# Patient Record
Sex: Male | Born: 1955
Health system: Southern US, Community
[De-identification: ages and names within clinical notes are randomized; demographics above are authoritative.]

## PROBLEM LIST (undated history)

## (undated) DIAGNOSIS — J302 Other seasonal allergic rhinitis: Secondary | ICD-10-CM

## (undated) DIAGNOSIS — I4891 Unspecified atrial fibrillation: Secondary | ICD-10-CM

## (undated) DIAGNOSIS — E785 Hyperlipidemia, unspecified: Secondary | ICD-10-CM

## (undated) DIAGNOSIS — I428 Other cardiomyopathies: Secondary | ICD-10-CM

## (undated) DIAGNOSIS — J45909 Unspecified asthma, uncomplicated: Secondary | ICD-10-CM

## (undated) DIAGNOSIS — I4892 Unspecified atrial flutter: Secondary | ICD-10-CM

## (undated) DIAGNOSIS — I10 Essential (primary) hypertension: Secondary | ICD-10-CM

## (undated) DIAGNOSIS — G4730 Sleep apnea, unspecified: Secondary | ICD-10-CM

## (undated) DIAGNOSIS — K5790 Diverticulosis of intestine, part unspecified, without perforation or abscess without bleeding: Secondary | ICD-10-CM

## (undated) DIAGNOSIS — K635 Polyp of colon: Secondary | ICD-10-CM

## (undated) DIAGNOSIS — I519 Heart disease, unspecified: Secondary | ICD-10-CM

## (undated) DIAGNOSIS — H269 Unspecified cataract: Secondary | ICD-10-CM

## (undated) DIAGNOSIS — I509 Heart failure, unspecified: Secondary | ICD-10-CM

## (undated) DIAGNOSIS — Q249 Congenital malformation of heart, unspecified: Secondary | ICD-10-CM

## (undated) DIAGNOSIS — T7840XA Allergy, unspecified, initial encounter: Secondary | ICD-10-CM

## (undated) DIAGNOSIS — J310 Chronic rhinitis: Secondary | ICD-10-CM

## (undated) DIAGNOSIS — E119 Type 2 diabetes mellitus without complications: Secondary | ICD-10-CM

## (undated) HISTORY — PX: POLYPECTOMY: SHX149

## (undated) HISTORY — DX: Heart failure, unspecified: I50.9

## (undated) HISTORY — DX: Heart disease, unspecified: I51.9

## (undated) HISTORY — DX: Essential (primary) hypertension: I10

## (undated) HISTORY — DX: Hyperlipidemia, unspecified: E78.5

## (undated) HISTORY — DX: Other cardiomyopathies: I42.8

## (undated) HISTORY — DX: Unspecified atrial fibrillation: I48.91

## (undated) HISTORY — DX: Morbid (severe) obesity due to excess calories: E66.01

## (undated) HISTORY — DX: Unspecified asthma, uncomplicated: J45.909

## (undated) HISTORY — DX: Congenital malformation of heart, unspecified: Q24.9

## (undated) HISTORY — DX: Unspecified atrial flutter: I48.92

## (undated) HISTORY — DX: Chronic rhinitis: J31.0

## (undated) HISTORY — DX: Sleep apnea, unspecified: G47.30

## (undated) HISTORY — DX: Polyp of colon: K63.5

## (undated) HISTORY — DX: Diverticulosis of intestine, part unspecified, without perforation or abscess without bleeding: K57.90

## (undated) HISTORY — DX: Unspecified cataract: H26.9

## (undated) HISTORY — PX: COLONOSCOPY: SHX174

## (undated) HISTORY — DX: Allergy, unspecified, initial encounter: T78.40XA

---

## 2000-07-27 ENCOUNTER — Ambulatory Visit (HOSPITAL_BASED_OUTPATIENT_CLINIC_OR_DEPARTMENT_OTHER): Admission: RE | Admit: 2000-07-27 | Discharge: 2000-07-27 | Payer: Self-pay | Admitting: Internal Medicine

## 2000-07-27 ENCOUNTER — Encounter: Payer: Self-pay | Admitting: Pulmonary Disease

## 2000-11-23 ENCOUNTER — Encounter: Payer: Self-pay | Admitting: Pulmonary Disease

## 2000-11-23 ENCOUNTER — Ambulatory Visit (HOSPITAL_BASED_OUTPATIENT_CLINIC_OR_DEPARTMENT_OTHER): Admission: RE | Admit: 2000-11-23 | Discharge: 2000-11-23 | Payer: Self-pay | Admitting: Internal Medicine

## 2004-05-17 ENCOUNTER — Ambulatory Visit: Payer: Self-pay | Admitting: Internal Medicine

## 2004-07-25 ENCOUNTER — Ambulatory Visit: Payer: Self-pay | Admitting: Internal Medicine

## 2004-08-22 ENCOUNTER — Ambulatory Visit: Payer: Self-pay | Admitting: Internal Medicine

## 2004-12-11 ENCOUNTER — Ambulatory Visit: Payer: Self-pay | Admitting: Internal Medicine

## 2005-03-26 ENCOUNTER — Ambulatory Visit: Payer: Self-pay | Admitting: Internal Medicine

## 2005-05-20 ENCOUNTER — Ambulatory Visit: Payer: Self-pay | Admitting: Internal Medicine

## 2005-06-20 ENCOUNTER — Ambulatory Visit: Payer: Self-pay | Admitting: Internal Medicine

## 2005-08-29 ENCOUNTER — Ambulatory Visit: Payer: Self-pay | Admitting: Internal Medicine

## 2005-09-05 ENCOUNTER — Ambulatory Visit: Payer: Self-pay | Admitting: Internal Medicine

## 2005-10-07 ENCOUNTER — Ambulatory Visit: Payer: Self-pay | Admitting: Internal Medicine

## 2006-10-19 ENCOUNTER — Ambulatory Visit: Payer: Self-pay | Admitting: Internal Medicine

## 2006-11-26 ENCOUNTER — Ambulatory Visit: Payer: Self-pay | Admitting: Internal Medicine

## 2006-11-26 LAB — CONVERTED CEMR LAB
Albumin: 4 g/dL (ref 3.5–5.2)
Alkaline Phosphatase: 47 units/L (ref 39–117)
BUN: 19 mg/dL (ref 6–23)
Basophils Absolute: 0.1 10*3/uL (ref 0.0–0.1)
Bilirubin Urine: NEGATIVE
CO2: 30 meq/L (ref 19–32)
CRP, High Sensitivity: 4 (ref 0.00–5.00)
Cholesterol: 222 mg/dL (ref 0–200)
Creatinine, Ser: 1.1 mg/dL (ref 0.4–1.5)
Direct LDL: 127.1 mg/dL
Eosinophils Absolute: 0.1 10*3/uL (ref 0.0–0.6)
GFR calc Af Amer: 91 mL/min
HDL: 67.7 mg/dL (ref >39.0)
Ketones, ur: NEGATIVE mg/dL
Leukocytes, UA: NEGATIVE
Lymphocytes Relative: 26.2 % (ref 12.0–46.0)
MCHC: 35.1 g/dL (ref 30.0–36.0)
MCV: 89.6 fL (ref 78.0–100.0)
Monocytes Absolute: 0.4 10*3/uL (ref 0.2–0.7)
Monocytes Relative: 7.3 % (ref 3.0–11.0)
Neutro Abs: 2.9 10*3/uL (ref 1.4–7.7)
Nitrite: NEGATIVE
Platelets: 203 10*3/uL (ref 150–400)
Potassium: 3.8 meq/L (ref 3.5–5.1)
Sodium: 142 meq/L (ref 135–145)
Specific Gravity, Urine: 1.025 (ref 1.000–1.03)
Total Bilirubin: 1.3 mg/dL — ABNORMAL HIGH (ref 0.3–1.2)
Total Protein: 7.4 g/dL (ref 6.0–8.3)
Urine Glucose: NEGATIVE mg/dL
Urobilinogen, UA: 0.2 (ref 0.0–1.0)
VLDL: 15 mg/dL (ref 0–40)

## 2007-12-20 ENCOUNTER — Telehealth (INDEPENDENT_AMBULATORY_CARE_PROVIDER_SITE_OTHER): Payer: Self-pay | Admitting: *Deleted

## 2007-12-24 ENCOUNTER — Telehealth (INDEPENDENT_AMBULATORY_CARE_PROVIDER_SITE_OTHER): Payer: Self-pay | Admitting: *Deleted

## 2008-01-24 DIAGNOSIS — E785 Hyperlipidemia, unspecified: Secondary | ICD-10-CM

## 2008-01-24 DIAGNOSIS — E1169 Type 2 diabetes mellitus with other specified complication: Secondary | ICD-10-CM | POA: Insufficient documentation

## 2008-01-24 DIAGNOSIS — G4730 Sleep apnea, unspecified: Secondary | ICD-10-CM | POA: Insufficient documentation

## 2008-01-24 DIAGNOSIS — I10 Essential (primary) hypertension: Secondary | ICD-10-CM | POA: Insufficient documentation

## 2008-01-25 ENCOUNTER — Ambulatory Visit: Payer: Self-pay | Admitting: Internal Medicine

## 2008-01-25 DIAGNOSIS — J45909 Unspecified asthma, uncomplicated: Secondary | ICD-10-CM | POA: Insufficient documentation

## 2008-01-28 ENCOUNTER — Telehealth (INDEPENDENT_AMBULATORY_CARE_PROVIDER_SITE_OTHER): Payer: Self-pay | Admitting: *Deleted

## 2008-01-28 ENCOUNTER — Encounter: Payer: Self-pay | Admitting: Internal Medicine

## 2008-03-08 ENCOUNTER — Ambulatory Visit: Payer: Self-pay | Admitting: Internal Medicine

## 2008-03-08 DIAGNOSIS — N400 Enlarged prostate without lower urinary tract symptoms: Secondary | ICD-10-CM | POA: Insufficient documentation

## 2008-03-09 LAB — CONVERTED CEMR LAB
AST: 36 units/L (ref 0–37)
Albumin: 4.2 g/dL (ref 3.5–5.2)
Alkaline Phosphatase: 56 units/L (ref 39–117)
BUN: 16 mg/dL (ref 6–23)
Basophils Relative: 1.1 % (ref 0.0–3.0)
Cholesterol: 176 mg/dL (ref 0–200)
Creatinine, Ser: 1.1 mg/dL (ref 0.4–1.5)
Crystals: NEGATIVE
Eosinophils Absolute: 0.2 10*3/uL (ref 0.0–0.7)
Eosinophils Relative: 3.7 % (ref 0.0–5.0)
GFR calc Af Amer: 90 mL/min
Glucose, Bld: 108 mg/dL — ABNORMAL HIGH (ref 70–99)
HCT: 49.4 % (ref 39.0–52.0)
Hemoglobin: 17.5 g/dL — ABNORMAL HIGH (ref 13.0–17.0)
MCV: 92.4 fL (ref 78.0–100.0)
Monocytes Absolute: 0.5 10*3/uL (ref 0.1–1.0)
Monocytes Relative: 9.4 % (ref 3.0–12.0)
Neutro Abs: 2.8 10*3/uL (ref 1.4–7.7)
Nitrite: NEGATIVE
PSA: 4.06 ng/mL — ABNORMAL HIGH (ref 0.10–4.00)
Platelets: 199 10*3/uL (ref 150–400)
Potassium: 3.9 meq/L (ref 3.5–5.1)
RBC: 5.35 M/uL (ref 4.22–5.81)
Specific Gravity, Urine: 1.02 (ref 1.000–1.03)
TSH: 2.17 microintl units/mL (ref 0.35–5.50)
Total Protein: 7.8 g/dL (ref 6.0–8.3)
Urine Glucose: NEGATIVE mg/dL
VLDL: 11 mg/dL (ref 0–40)
WBC: 4.8 10*3/uL (ref 4.5–10.5)
pH: 6 (ref 5.0–8.0)

## 2008-03-20 ENCOUNTER — Telehealth (INDEPENDENT_AMBULATORY_CARE_PROVIDER_SITE_OTHER): Payer: Self-pay | Admitting: *Deleted

## 2008-03-22 ENCOUNTER — Ambulatory Visit: Payer: Self-pay | Admitting: Gastroenterology

## 2008-04-03 ENCOUNTER — Ambulatory Visit: Payer: Self-pay | Admitting: Gastroenterology

## 2008-04-03 ENCOUNTER — Encounter: Payer: Self-pay | Admitting: Gastroenterology

## 2008-04-04 ENCOUNTER — Encounter: Payer: Self-pay | Admitting: Gastroenterology

## 2009-01-05 ENCOUNTER — Encounter: Payer: Self-pay | Admitting: Internal Medicine

## 2009-01-17 ENCOUNTER — Telehealth: Payer: Self-pay | Admitting: Internal Medicine

## 2009-01-24 ENCOUNTER — Ambulatory Visit: Payer: Self-pay | Admitting: Internal Medicine

## 2009-03-09 ENCOUNTER — Ambulatory Visit: Payer: Self-pay | Admitting: Internal Medicine

## 2009-03-09 DIAGNOSIS — I4891 Unspecified atrial fibrillation: Secondary | ICD-10-CM | POA: Insufficient documentation

## 2009-03-12 ENCOUNTER — Ambulatory Visit: Payer: Self-pay | Admitting: Cardiology

## 2009-03-12 ENCOUNTER — Ambulatory Visit: Payer: Self-pay | Admitting: Internal Medicine

## 2009-03-12 ENCOUNTER — Inpatient Hospital Stay (HOSPITAL_COMMUNITY): Admission: EM | Admit: 2009-03-12 | Discharge: 2009-03-16 | Payer: Self-pay | Admitting: Internal Medicine

## 2009-03-13 ENCOUNTER — Encounter: Payer: Self-pay | Admitting: Cardiology

## 2009-03-13 ENCOUNTER — Ambulatory Visit: Payer: Self-pay | Admitting: Internal Medicine

## 2009-03-13 ENCOUNTER — Telehealth (INDEPENDENT_AMBULATORY_CARE_PROVIDER_SITE_OTHER): Payer: Self-pay | Admitting: *Deleted

## 2009-03-19 ENCOUNTER — Ambulatory Visit: Payer: Self-pay | Admitting: Internal Medicine

## 2009-03-19 LAB — CONVERTED CEMR LAB: POC INR: 1.7

## 2009-03-22 ENCOUNTER — Ambulatory Visit: Payer: Self-pay | Admitting: Internal Medicine

## 2009-03-23 ENCOUNTER — Ambulatory Visit: Payer: Self-pay | Admitting: Internal Medicine

## 2009-03-23 LAB — CONVERTED CEMR LAB: POC INR: 1.8

## 2009-03-26 ENCOUNTER — Telehealth: Payer: Self-pay | Admitting: Internal Medicine

## 2009-03-26 LAB — CONVERTED CEMR LAB
Basophils Relative: 0.9 % (ref 0.0–3.0)
Chloride: 110 meq/L (ref 96–112)
Creatinine, Ser: 1.4 mg/dL (ref 0.4–1.5)
Eosinophils Relative: 2.3 % (ref 0.0–5.0)
GFR calc non Af Amer: 56.08 mL/min (ref >60)
HCT: 47.6 % (ref 39.0–52.0)
MCV: 92 fL (ref 78.0–100.0)
Monocytes Absolute: 0.5 10*3/uL (ref 0.1–1.0)
Monocytes Relative: 8.9 % (ref 3.0–12.0)
Neutrophils Relative %: 68.4 % (ref 43.0–77.0)
Potassium: 4.2 meq/L (ref 3.5–5.1)
RBC: 5.17 M/uL (ref 4.22–5.81)
WBC: 6.1 10*3/uL (ref 4.5–10.5)

## 2009-03-28 ENCOUNTER — Ambulatory Visit: Payer: Self-pay | Admitting: Internal Medicine

## 2009-03-28 DIAGNOSIS — I5022 Chronic systolic (congestive) heart failure: Secondary | ICD-10-CM | POA: Insufficient documentation

## 2009-04-09 ENCOUNTER — Telehealth: Payer: Self-pay | Admitting: Internal Medicine

## 2009-04-10 ENCOUNTER — Encounter: Payer: Self-pay | Admitting: Internal Medicine

## 2009-04-10 ENCOUNTER — Ambulatory Visit: Payer: Self-pay | Admitting: Cardiovascular Disease

## 2009-04-10 ENCOUNTER — Ambulatory Visit: Payer: Self-pay | Admitting: Internal Medicine

## 2009-04-10 LAB — CONVERTED CEMR LAB
Calcium: 8.8 mg/dL (ref 8.4–10.5)
GFR calc non Af Amer: 61.08 mL/min (ref >60)
Glucose, Bld: 108 mg/dL — ABNORMAL HIGH (ref 70–99)
POC INR: 2
Potassium: 3.6 meq/L (ref 3.5–5.1)
Pro B Natriuretic peptide (BNP): 1300 pg/mL — ABNORMAL HIGH (ref 0.0–100.0)
Sodium: 141 meq/L (ref 135–145)

## 2009-04-12 ENCOUNTER — Ambulatory Visit: Payer: Self-pay | Admitting: Internal Medicine

## 2009-04-17 ENCOUNTER — Encounter: Payer: Self-pay | Admitting: Internal Medicine

## 2009-04-18 ENCOUNTER — Telehealth: Payer: Self-pay | Admitting: Internal Medicine

## 2009-04-20 ENCOUNTER — Ambulatory Visit: Payer: Self-pay | Admitting: Cardiovascular Disease

## 2009-04-20 LAB — CONVERTED CEMR LAB: POC INR: 3.9

## 2009-04-23 ENCOUNTER — Telehealth: Payer: Self-pay | Admitting: Internal Medicine

## 2009-04-30 ENCOUNTER — Ambulatory Visit: Payer: Self-pay | Admitting: Cardiology

## 2009-04-30 ENCOUNTER — Ambulatory Visit: Payer: Self-pay | Admitting: Internal Medicine

## 2009-04-30 LAB — CONVERTED CEMR LAB: POC INR: 3.9

## 2009-05-01 ENCOUNTER — Encounter: Payer: Self-pay | Admitting: Internal Medicine

## 2009-05-04 ENCOUNTER — Ambulatory Visit (HOSPITAL_COMMUNITY): Admission: RE | Admit: 2009-05-04 | Discharge: 2009-05-04 | Payer: Self-pay | Admitting: Internal Medicine

## 2009-05-04 ENCOUNTER — Ambulatory Visit: Payer: Self-pay | Admitting: Cardiology

## 2009-05-07 ENCOUNTER — Telehealth: Payer: Self-pay | Admitting: Internal Medicine

## 2009-05-11 ENCOUNTER — Ambulatory Visit: Payer: Self-pay | Admitting: Internal Medicine

## 2009-06-01 ENCOUNTER — Ambulatory Visit: Payer: Self-pay | Admitting: Cardiovascular Disease

## 2009-06-01 ENCOUNTER — Encounter (INDEPENDENT_AMBULATORY_CARE_PROVIDER_SITE_OTHER): Payer: Self-pay | Admitting: Cardiology

## 2009-06-01 LAB — CONVERTED CEMR LAB: POC INR: 3.3

## 2009-06-04 ENCOUNTER — Telehealth: Payer: Self-pay | Admitting: Internal Medicine

## 2009-06-12 ENCOUNTER — Ambulatory Visit: Payer: Self-pay | Admitting: Cardiology

## 2009-06-12 ENCOUNTER — Ambulatory Visit: Payer: Self-pay | Admitting: Internal Medicine

## 2009-06-12 LAB — CONVERTED CEMR LAB: POC INR: 4.5

## 2009-06-21 ENCOUNTER — Encounter: Payer: Self-pay | Admitting: Internal Medicine

## 2009-06-22 ENCOUNTER — Inpatient Hospital Stay (HOSPITAL_COMMUNITY): Admission: AD | Admit: 2009-06-22 | Discharge: 2009-06-24 | Payer: Self-pay | Admitting: Internal Medicine

## 2009-06-22 ENCOUNTER — Ambulatory Visit: Payer: Self-pay | Admitting: Internal Medicine

## 2009-06-29 ENCOUNTER — Telehealth: Payer: Self-pay | Admitting: Internal Medicine

## 2009-07-04 ENCOUNTER — Telehealth (INDEPENDENT_AMBULATORY_CARE_PROVIDER_SITE_OTHER): Payer: Self-pay | Admitting: Cardiology

## 2009-07-06 ENCOUNTER — Ambulatory Visit: Payer: Self-pay | Admitting: Cardiology

## 2009-07-06 LAB — CONVERTED CEMR LAB: POC INR: 3.1

## 2009-07-13 ENCOUNTER — Encounter (INDEPENDENT_AMBULATORY_CARE_PROVIDER_SITE_OTHER): Payer: Self-pay | Admitting: Cardiology

## 2009-07-13 ENCOUNTER — Ambulatory Visit: Payer: Self-pay | Admitting: Internal Medicine

## 2009-07-20 ENCOUNTER — Ambulatory Visit: Payer: Self-pay | Admitting: Internal Medicine

## 2009-07-20 LAB — CONVERTED CEMR LAB: POC INR: 2.5

## 2009-07-27 ENCOUNTER — Ambulatory Visit: Payer: Self-pay | Admitting: Internal Medicine

## 2009-08-02 ENCOUNTER — Ambulatory Visit: Payer: Self-pay | Admitting: Cardiology

## 2009-08-03 ENCOUNTER — Ambulatory Visit (HOSPITAL_COMMUNITY): Admission: RE | Admit: 2009-08-03 | Discharge: 2009-08-03 | Payer: Self-pay | Admitting: Radiology

## 2009-08-03 ENCOUNTER — Ambulatory Visit: Payer: Self-pay | Admitting: Cardiology

## 2009-08-15 ENCOUNTER — Encounter: Payer: Self-pay | Admitting: Internal Medicine

## 2009-09-05 ENCOUNTER — Ambulatory Visit: Payer: Self-pay | Admitting: Internal Medicine

## 2009-09-05 DIAGNOSIS — I5043 Acute on chronic combined systolic (congestive) and diastolic (congestive) heart failure: Secondary | ICD-10-CM | POA: Insufficient documentation

## 2009-09-20 ENCOUNTER — Ambulatory Visit (HOSPITAL_COMMUNITY)
Admission: RE | Admit: 2009-09-20 | Discharge: 2009-09-20 | Payer: Self-pay | Source: Home / Self Care | Admitting: Internal Medicine

## 2009-09-20 ENCOUNTER — Encounter: Payer: Self-pay | Admitting: Internal Medicine

## 2009-09-20 ENCOUNTER — Ambulatory Visit: Payer: Self-pay | Admitting: Cardiology

## 2009-09-20 ENCOUNTER — Ambulatory Visit: Payer: Self-pay | Admitting: Internal Medicine

## 2009-09-20 ENCOUNTER — Ambulatory Visit: Payer: Self-pay

## 2009-09-20 ENCOUNTER — Encounter (INDEPENDENT_AMBULATORY_CARE_PROVIDER_SITE_OTHER): Payer: Self-pay

## 2009-09-20 LAB — CONVERTED CEMR LAB
CO2: 32 meq/L (ref 19–32)
Calcium: 9 mg/dL (ref 8.4–10.5)
Creatinine, Ser: 1.3 mg/dL (ref 0.4–1.5)
Glucose, Bld: 111 mg/dL — ABNORMAL HIGH (ref 70–99)

## 2009-09-27 ENCOUNTER — Ambulatory Visit: Payer: Self-pay | Admitting: Internal Medicine

## 2009-10-26 ENCOUNTER — Ambulatory Visit: Payer: Self-pay | Admitting: Cardiology

## 2009-11-16 ENCOUNTER — Ambulatory Visit: Payer: Self-pay | Admitting: Cardiovascular Disease

## 2009-11-16 LAB — CONVERTED CEMR LAB: POC INR: 2.6

## 2009-12-07 ENCOUNTER — Ambulatory Visit: Payer: Self-pay | Admitting: Cardiology

## 2010-01-04 ENCOUNTER — Ambulatory Visit: Payer: Self-pay | Admitting: Cardiology

## 2010-02-06 ENCOUNTER — Ambulatory Visit: Payer: Self-pay | Admitting: Internal Medicine

## 2010-02-06 ENCOUNTER — Encounter: Payer: Self-pay | Admitting: Internal Medicine

## 2010-02-06 LAB — CONVERTED CEMR LAB: POC INR: 1.4

## 2010-02-15 ENCOUNTER — Ambulatory Visit: Payer: Self-pay | Admitting: Internal Medicine

## 2010-03-22 ENCOUNTER — Ambulatory Visit: Payer: Self-pay | Admitting: Internal Medicine

## 2010-03-22 DIAGNOSIS — E039 Hypothyroidism, unspecified: Secondary | ICD-10-CM | POA: Insufficient documentation

## 2010-04-08 ENCOUNTER — Telehealth (INDEPENDENT_AMBULATORY_CARE_PROVIDER_SITE_OTHER): Payer: Self-pay | Admitting: *Deleted

## 2010-04-26 ENCOUNTER — Ambulatory Visit: Payer: Self-pay | Admitting: Internal Medicine

## 2010-04-26 DIAGNOSIS — R319 Hematuria, unspecified: Secondary | ICD-10-CM | POA: Insufficient documentation

## 2010-04-29 LAB — CONVERTED CEMR LAB
ALT: 26 units/L (ref 0–53)
AST: 26 units/L (ref 0–37)
Albumin: 4.2 g/dL (ref 3.5–5.2)
Alkaline Phosphatase: 57 units/L (ref 39–117)
Eosinophils Relative: 3.4 % (ref 0.0–5.0)
GFR calc non Af Amer: 55.4 mL/min (ref >60)
HCT: 45.9 % (ref 39.0–52.0)
Hemoglobin: 16.2 g/dL (ref 13.0–17.0)
LDL Cholesterol: 80 mg/dL (ref 0–99)
Lymphocytes Relative: 20.6 % (ref 12.0–46.0)
Lymphs Abs: 1.3 10*3/uL (ref 0.7–4.0)
Monocytes Relative: 7.8 % (ref 3.0–12.0)
Neutro Abs: 4.2 10*3/uL (ref 1.4–7.7)
Nitrite: NEGATIVE
Potassium: 4.2 meq/L (ref 3.5–5.1)
Sodium: 144 meq/L (ref 135–145)
TSH: 8.38 microintl units/mL — ABNORMAL HIGH (ref 0.35–5.50)
Total Bilirubin: 0.9 mg/dL (ref 0.3–1.2)
Total CHOL/HDL Ratio: 3
Total Protein, Urine: NEGATIVE mg/dL
Urine Glucose: NEGATIVE mg/dL
VLDL: 19.2 mg/dL (ref 0.0–40.0)
WBC: 6.2 10*3/uL (ref 4.5–10.5)
pH: 6.5 (ref 5.0–8.0)

## 2010-05-15 ENCOUNTER — Telehealth: Payer: Self-pay | Admitting: Internal Medicine

## 2010-05-16 ENCOUNTER — Telehealth: Payer: Self-pay | Admitting: Internal Medicine

## 2010-05-17 ENCOUNTER — Ambulatory Visit: Payer: Self-pay | Admitting: Internal Medicine

## 2010-05-18 ENCOUNTER — Encounter: Payer: Self-pay | Admitting: Adult Health

## 2010-05-20 ENCOUNTER — Telehealth (INDEPENDENT_AMBULATORY_CARE_PROVIDER_SITE_OTHER): Payer: Self-pay | Admitting: *Deleted

## 2010-05-20 LAB — CONVERTED CEMR LAB
Casts: NONE SEEN /lpf
Crystals: NONE SEEN
Leukocytes, UA: NEGATIVE
Specific Gravity, Urine: 1.015 (ref 1.005–1.030)
Squamous Epithelial / LPF: NONE SEEN /lpf
Urine Glucose: NEGATIVE mg/dL
pH: 6 (ref 5.0–8.0)

## 2010-05-22 DIAGNOSIS — E291 Testicular hypofunction: Secondary | ICD-10-CM | POA: Insufficient documentation

## 2010-05-23 ENCOUNTER — Telehealth: Payer: Self-pay | Admitting: Internal Medicine

## 2010-05-29 ENCOUNTER — Telehealth (INDEPENDENT_AMBULATORY_CARE_PROVIDER_SITE_OTHER): Payer: Self-pay | Admitting: *Deleted

## 2010-05-30 ENCOUNTER — Encounter: Payer: Self-pay | Admitting: Adult Health

## 2010-06-04 ENCOUNTER — Telehealth (INDEPENDENT_AMBULATORY_CARE_PROVIDER_SITE_OTHER): Payer: Self-pay | Admitting: *Deleted

## 2010-06-04 ENCOUNTER — Telehealth: Payer: Self-pay | Admitting: Internal Medicine

## 2010-06-04 ENCOUNTER — Encounter: Payer: Self-pay | Admitting: Adult Health

## 2010-06-19 ENCOUNTER — Telehealth: Payer: Self-pay | Admitting: Internal Medicine

## 2010-06-28 ENCOUNTER — Ambulatory Visit
Admission: RE | Admit: 2010-06-28 | Discharge: 2010-06-28 | Payer: Self-pay | Source: Home / Self Care | Attending: Internal Medicine | Admitting: Internal Medicine

## 2010-07-12 ENCOUNTER — Other Ambulatory Visit: Payer: Self-pay | Admitting: Internal Medicine

## 2010-07-12 ENCOUNTER — Encounter: Payer: Self-pay | Admitting: Internal Medicine

## 2010-07-12 ENCOUNTER — Ambulatory Visit
Admission: RE | Admit: 2010-07-12 | Discharge: 2010-07-12 | Payer: Self-pay | Source: Home / Self Care | Attending: Internal Medicine | Admitting: Internal Medicine

## 2010-07-12 DIAGNOSIS — I4892 Unspecified atrial flutter: Secondary | ICD-10-CM | POA: Insufficient documentation

## 2010-07-12 LAB — CBC WITH DIFFERENTIAL/PLATELET
Basophils Absolute: 0 10*3/uL (ref 0.0–0.1)
Eosinophils Absolute: 0.2 10*3/uL (ref 0.0–0.7)
Eosinophils Relative: 2.8 % (ref 0.0–5.0)
MCV: 90.2 fl (ref 78.0–100.0)
Monocytes Absolute: 0.5 10*3/uL (ref 0.1–1.0)
Neutrophils Relative %: 64.4 % (ref 43.0–77.0)
Platelets: 187 10*3/uL (ref 150.0–400.0)
RDW: 13.8 % (ref 11.5–14.6)
WBC: 6.9 10*3/uL (ref 4.5–10.5)

## 2010-07-12 LAB — BASIC METABOLIC PANEL
BUN: 29 mg/dL — ABNORMAL HIGH (ref 6–23)
Chloride: 109 mEq/L (ref 96–112)
Creatinine, Ser: 1.4 mg/dL (ref 0.4–1.5)

## 2010-07-14 LAB — CONVERTED CEMR LAB
ALT: 23 U/L
AST: 28 U/L
Albumin: 4.5 g/dL
Alkaline Phosphatase: 52 U/L
BUN: 18 mg/dL
BUN: 22 mg/dL
BUN: 26 mg/dL — ABNORMAL HIGH (ref 6–23)
BUN: 49 mg/dL — ABNORMAL HIGH (ref 6–23)
Basophils Absolute: 0 K/uL
Basophils Absolute: 0.1 K/uL
Basophils Relative: 0 % (ref 0.0–3.0)
Basophils Relative: 0.1 %
Basophils Relative: 1.1 %
Bilirubin, Direct: 0.2 mg/dL
CO2: 28 meq/L
CO2: 33 meq/L — ABNORMAL HIGH
CO2: 34 meq/L — ABNORMAL HIGH (ref 19–32)
CO2: 36 meq/L — ABNORMAL HIGH (ref 19–32)
Calcium: 9.1 mg/dL (ref 8.4–10.5)
Calcium: 9.2 mg/dL
Calcium: 9.2 mg/dL (ref 8.4–10.5)
Calcium: 9.3 mg/dL
Calcium: 9.6 mg/dL (ref 8.4–10.5)
Chloride: 102 meq/L (ref 96–112)
Chloride: 103 meq/L
Chloride: 105 meq/L
Cholesterol: 145 mg/dL
Creatinine, Ser: 1.2 mg/dL (ref 0.4–1.5)
Creatinine, Ser: 1.3 mg/dL
Creatinine, Ser: 1.4 mg/dL
Creatinine, Ser: 1.4 mg/dL (ref 0.4–1.5)
Creatinine, Ser: 1.9 mg/dL — ABNORMAL HIGH (ref 0.4–1.5)
Eosinophils Absolute: 0.1 K/uL
Eosinophils Absolute: 0.2 10*3/uL (ref 0.0–0.7)
Eosinophils Absolute: 0.2 10*3/uL (ref 0.0–0.7)
Eosinophils Absolute: 0.2 K/uL
Eosinophils Relative: 1.3 %
Eosinophils Relative: 3 %
GFR calc non Af Amer: 56.09 mL/min
GFR calc non Af Amer: 60.36 mL/min
GFR calc non Af Amer: 66.98 mL/min (ref >60)
Glucose, Bld: 119 mg/dL — ABNORMAL HIGH
Glucose, Bld: 130 mg/dL — ABNORMAL HIGH (ref 70–99)
Glucose, Bld: 92 mg/dL
HCT: 40.7 %
HCT: 48.6 %
HDL: 54.4 mg/dL
Hemoglobin: 14.4 g/dL
Hemoglobin: 16.4 g/dL
Hemoglobin: 17.5 g/dL — ABNORMAL HIGH (ref 13.0–17.0)
INR: 2.1 — ABNORMAL HIGH (ref 0.8–1.0)
INR: 4.1 — ABNORMAL HIGH (ref 0.8–1.0)
LDL Cholesterol: 82 mg/dL
Leukocytes, UA: NEGATIVE
Lymphocytes Relative: 15.2 %
Lymphocytes Relative: 19.8 % (ref 12.0–46.0)
Lymphocytes Relative: 23.5 %
Lymphs Abs: 1.1 K/uL
Lymphs Abs: 1.3 K/uL
MCHC: 33.7 g/dL
MCHC: 33.7 g/dL (ref 30.0–36.0)
MCHC: 35.5 g/dL
MCV: 90.4 fL (ref 78.0–100.0)
MCV: 92.1 fL
MCV: 94.1 fL
Magnesium: 1.8 mg/dL (ref 1.5–2.5)
Monocytes Absolute: 0.3 K/uL
Monocytes Absolute: 0.5 10*3/uL (ref 0.1–1.0)
Monocytes Absolute: 0.5 K/uL
Monocytes Relative: 4.1 %
Monocytes Relative: 6.8 % (ref 3.0–12.0)
Monocytes Relative: 9.2 %
Neutro Abs: 3.4 K/uL
Neutro Abs: 5.7 10*3/uL (ref 1.4–7.7)
Neutro Abs: 5.9 K/uL
Neutrophils Relative %: 58.3 % (ref 43.0–77.0)
Neutrophils Relative %: 63.2 %
Neutrophils Relative %: 71 % (ref 43.0–77.0)
Neutrophils Relative %: 79.3 % — ABNORMAL HIGH
Nitrite: NEGATIVE
Platelets: 153 K/uL
Platelets: 157 K/uL
Platelets: 180 10*3/uL (ref 150.0–400.0)
Potassium: 3.4 meq/L — ABNORMAL LOW
Potassium: 3.8 meq/L
Pro B Natriuretic peptide (BNP): 579 pg/mL — ABNORMAL HIGH
Prothrombin Time: 21.6 s — ABNORMAL HIGH (ref 9.1–11.7)
RBC: 4.33 M/uL
RBC: 5.27 M/uL
RBC: 5.89 M/uL — ABNORMAL HIGH (ref 4.22–5.81)
RDW: 13.2 %
RDW: 13.6 %
Sodium: 141 meq/L
Sodium: 143 meq/L (ref 135–145)
Sodium: 144 meq/L
Specific Gravity, Urine: 1.025
TSH: 2.61 u[IU]/mL
TSH: 55.23 u[IU]/mL — ABNORMAL HIGH
Total Bilirubin: 1.2 mg/dL
Total CHOL/HDL Ratio: 3
Total Protein, Urine: 100 mg/dL
Total Protein: 7.2 g/dL
Triglycerides: 42 mg/dL
Urine Glucose: NEGATIVE mg/dL
Urobilinogen, UA: 1
VLDL: 8.4 mg/dL
WBC: 5 10*3/uL (ref 4.5–10.5)
WBC: 5.4 10*3/microliter
WBC: 7.4 10*3/microliter
WBC: 8.1 10*3/uL (ref 4.5–10.5)
aPTT: 44 s — ABNORMAL HIGH (ref 21.7–28.8)
pH: 6

## 2010-07-16 NOTE — Medication Information (Signed)
Summary: rov/sp  Anticoagulant Therapy  Managed by: Weston Brass, PharmD Referring MD: Dr. Johney Frame PCP: Dr Nolon Lennert MD: Excell Seltzer MD, Casimiro Needle Indication 1: Atrial Fibrillation Indication 2: - Lab Used: [facility] [place] Site: [address] INR POC 2.6 INR RANGE 2-3  Dietary changes: no    Health status changes: no    Bleeding/hemorrhagic complications: no    Recent/future hospitalizations: no    Any changes in medication regimen? no    Recent/future dental: no  Any missed doses?: yes     Details: may have missed two doses  Is patient compliant with meds? yes       Allergies: No Known Drug Allergies  Anticoagulation Management History:      The patient is taking warfarin and comes in today for a routine follow up visit.  Negative risk factors for bleeding include an age less than 84 years old.  The bleeding index is 'low risk'.  Positive CHADS2 values include History of CHF and History of HTN.  Negative CHADS2 values include Age > 69 years old.  His last INR was 2.1 ratio.  Anticoagulation responsible provider: Excell Seltzer MD, Casimiro Needle.  INR POC: 2.6.  Cuvette Lot#: [id number].  Exp: 01/2011.    Anticoagulation Management Assessment/Plan:      The patient's current anticoagulation dose is Coumadin 5 mg tabs: Take as directed by coumadin clinic..  The target INR is 2.0-3.0.  The next INR is due 12/07/2009.  Anticoagulation instructions were given to patient.  Results were reviewed/authorized by Weston Brass, PharmD.  He was notified by [facility].         Prior Anticoagulation Instructions: INR 1.7  Take 1 1/2 tablets today then resume same dose of 1 tablet every day except 1 1/2 tablets on Wednesday   Current Anticoagulation Instructions: INR-2.6 Resume normal dosing schedule. Take 1.5 tablets on Wednesday and take 1 tablet on all other days. Return in 3 weeks

## 2010-07-16 NOTE — Medication Information (Signed)
Summary: rov/tm  Anticoagulant Therapy  Managed by: Bethena Midget, RN, BSN Referring MD: Dr. Johney Frame PCP: Dr [name] Supervising MD: Johney Frame MD, Fayrene Fearing Indication 1: Atrial Fibrillation Indication 2: - Lab Used: [facility] [place] Site: [address] INR POC 1.4 INR RANGE 2-3  Dietary changes: no    Health status changes: no    Bleeding/hemorrhagic complications: no    Recent/future hospitalizations: no    Any changes in medication regimen? yes       Details: Starting MVI and fish oil  Recent/future dental: no  Any missed doses?: no       Is patient compliant with meds? yes      Comments: Seeing Dr Johney Frame today  Allergies: No Known Drug Allergies  Anticoagulation Management History:      The patient is taking warfarin and comes in today for a routine follow up visit.  Negative risk factors for bleeding include an age less than 39 years old.  The bleeding index is 'low risk'.  Positive CHADS2 values include History of CHF and History of HTN.  Negative CHADS2 values include Age > 45 years old.  His last INR was 2.1 ratio.  Anticoagulation responsible provider: Allred MD, Fayrene Fearing.  INR POC: 1.4.  Cuvette Lot#: [id number].  Exp: 03/2011.    Anticoagulation Management Assessment/Plan:      The patient's current anticoagulation dose is Coumadin 5 mg tabs: Take as directed by coumadin clinic..  The target INR is 2.0-3.0.  The next INR is due 02/15/2010.  Anticoagulation instructions were given to patient.  Results were reviewed/authorized by Bethena Midget, RN, BSN.  He was notified by Bethena Midget, RN, BSN.         Prior Anticoagulation Instructions: INR 2.4 Continue 5mg s everyday except 7.5mg s on Wednesdays. Recheck in 4 weeks.   Current Anticoagulation Instructions: INR 1.4 Today take 10mg s, Thursday take 7.5mg s then resume 5mg s everyday except 7.5mg s on Wednesdays. Recheck in 10 days.

## 2010-07-16 NOTE — Medication Information (Signed)
Summary: rov/eh  Anticoagulant Therapy  Managed by: Shelby Dubin, PharmD, BCPS, CPP Referring MD: Dr. Johney Frame PCP: Dr Nolon Lennert MD: Gala Romney MD, Reuel Boom Indication 1: Atrial Fibrillation Indication 2: - Lab Used: [facility] [place] Site: [address] INR POC 2.3 INR RANGE 2-3  Dietary changes: no    Health status changes: no    Bleeding/hemorrhagic complications: no    Recent/future hospitalizations: no    Any changes in medication regimen? no    Recent/future dental: no  Any missed doses?: yes     Details: missed Wednesday's dose..    Current Medications (verified): 1)  Bayer Aspirin Ec Low Dose 81 Mg  Tbec (Aspirin) .[name].. 1 By Mouth Once Daily 2)  Crestor 10 Mg  Tabs (Rosuvastatin Calcium) .... Take 1 Tab By Mouth At Bedtime 3)  Nasonex 50 Mcg/act  Susp (Mometasone Furoate) .... 2 Sprays Each Nostril Once Daily 4)  Symbicort 80-4.5 Mcg/act  Aero (Budesonide-Formoterol Fumarate) .... 2 Puffs First Thing  in Am and 2 Puffs Again in Pm About 12 Hours Later 5)  Prilosec Otc 20 Mg Tbec (Omeprazole Magnesium) .... Take  One 30-60 Min Before First Meal of The Day 6)  Carvedilol 12.5 Mg Tabs (Carvedilol) .... 2 Tabs By Mouth  Every Morning and 2every Evening 7)  Diovan 320 Mg Tabs (Valsartan) .[name].. 1 Once Daily 8)  Furosemide 80 Mg Tabs (Furosemide) .... Take One Tablet By Mouth Twice  A Day 9)  Klor-Con M20 20 Meq Cr-Tabs (Potassium Chloride Crys Cr) .[name].. 1 Tab Two Times A Day 10)  Coumadin 5 Mg Tabs (Warfarin Sodium) .... Take As Directed By Coumadin Clinic. 11)  Dilt-Cd 180 Mg Xr24h-Cap (Diltiazem Hcl Coated Beads) .... One Tablet Bid 12)  Vitamin B-12 500 Mcg  Tabs (Cyanocobalamin) .... Take 1 Tablet By Mouth Once A Day 13)  Muscadine .... Once Daily 14)  Cpap 14cm .... At Bedtime 15)  Xopenex Hfa 45 Mcg/act Aero (Levalbuterol Tartrate) .... 2 Every 4 Hours As Needed 16)  Afrin Nasal Spray 0.05 %  Soln (Oxymetazoline Hcl) .... 2 Puffs Two Times A Day  For 5 Days Prn 17)  Tylenol 325 Mg Tabs (Acetaminophen) .... Per Bottle/prn 18)  Oxycodone-Acetaminophen 5-325 Mg Tabs (Oxycodone-Acetaminophen) .[name].. 1-2 Tablets Every 4-6 Hours As Needed 19)  Viagra 100 Mg  Tabs (Sildenafil Citrate) .... Take As Directed/prn 20)  Amiodarone Hcl 200 Mg Tabs (Amiodarone Hcl) .... Take Two Tablets By Mouth Twice A Day  Allergies (verified): No Known Drug Allergies  Anticoagulation Management History:      The patient is taking warfarin and comes in today for a routine follow up visit.  Negative risk factors for bleeding include an age less than 74 years old.  The bleeding index is 'low risk'.  Positive CHADS2 values include History of CHF and History of HTN.  Negative CHADS2 values include Age > 60 years old.  His last INR was 4.1 ratio.  Anticoagulation responsible provider: Arlie Riker MD, Reuel Boom.  INR POC: 2.3.  Cuvette Lot#: [id number].  Exp: 09/2010.    Anticoagulation Management Assessment/Plan:      The patient's current anticoagulation dose is Coumadin 5 mg tabs: Take as directed by coumadin clinic..  The target INR is 2.0-3.0.  The next INR is due 07/20/2009.  Anticoagulation instructions were given to patient.  Results were reviewed/authorized by Shelby Dubin, PharmD, BCPS, CPP.  He was notified by Shelby Dubin PharmD, BCPS, CPP.         Prior Anticoagulation  Instructions: INR 3.1  Take 0.5 tablet today then change dose to 1 tablet daily except 1.5 tablets on Mondays, Wednesdays, and Fridays. Recheck in 1 week.  Current Anticoagulation Instructions: INR 2.3  Continue 1 tab daily except 1.5 tabs Mondays, Wednesdays, Fridays.  OK to have teeth cleaned with INR less than 3.0.  Call clinic with questions [phone],  Recheck in

## 2010-07-16 NOTE — Medication Information (Signed)
Summary: rov/kb  Anticoagulant Therapy  Managed by: Cloyde Reams, RN, BSN Referring MD: Dr. Johney Frame PCP: Dr Nolon Lennert MD: Riley Kill MD, Maisie Fus Indication 1: Atrial Fibrillation Indication 2: - Lab Used: [facility] [place] Site: [address] INR POC 1.9 INR RANGE 2-3  Dietary changes: no    Health status changes: no    Bleeding/hemorrhagic complications: no    Recent/future hospitalizations: no    Any changes in medication regimen? no    Recent/future dental: no  Any missed doses?: no       Is patient compliant with meds? yes       Allergies: No Known Drug Allergies  Anticoagulation Management History:      The patient is taking warfarin and comes in today for a routine follow up visit.  Negative risk factors for bleeding include an age less than 58 years old.  The bleeding index is 'low risk'.  Positive CHADS2 values include History of CHF and History of HTN.  Negative CHADS2 values include Age > 48 years old.  His last INR was 2.1 ratio.  Anticoagulation responsible provider: Riley Kill MD, Maisie Fus.  INR POC: 1.9.  Cuvette Lot#: [id number].  Exp: 02/2011.    Anticoagulation Management Assessment/Plan:      The patient's current anticoagulation dose is Coumadin 5 mg tabs: Take as directed by coumadin clinic..  The target INR is 2.0-3.0.  The next INR is due 01/04/2010.  Anticoagulation instructions were given to patient.  Results were reviewed/authorized by Cloyde Reams, RN, BSN.  He was notified by Cloyde Reams RN.         Prior Anticoagulation Instructions: INR-2.6 Resume normal dosing schedule. Take 1.5 tablets on Wednesday and take 1 tablet on all other days. Return in 3 weeks  Current Anticoagulation Instructions: INR 1.9  Take 1.5 tablets today, then resume same dosage 1 tablet daily except 1.5 tablets on Wednesdays.  Recheck in 4 weeks.

## 2010-07-16 NOTE — Medication Information (Signed)
Summary: ccr/jss  Anticoagulant Therapy  Managed by: Cloyde Reams, RN, BSN Referring MD: Dr. Johney Frame PCP: Dr [name] Supervising MD: Johney Frame MD, Fayrene Fearing Indication 1: Atrial Fibrillation Indication 2: - Lab Used: [facility] [place] Site: [address] INR POC 1.9 INR RANGE 2-3  Dietary changes: no    Health status changes: no    Bleeding/hemorrhagic complications: no    Recent/future hospitalizations: no    Any changes in medication regimen? no    Recent/future dental: no  Any missed doses?: yes     Details: May have missed 1 or 2 doses since last visit.  Pt has been taking 1 tablet daily.    Is patient compliant with meds? yes       Allergies (verified): No Known Drug Allergies  Anticoagulation Management History:      The patient is taking warfarin and comes in today for a routine follow up visit.  Negative risk factors for bleeding include an age less than 37 years old.  The bleeding index is 'low risk'.  Positive CHADS2 values include History of CHF and History of HTN.  Negative CHADS2 values include Age > 57 years old.  His last INR was 2.1 ratio.  Anticoagulation responsible provider: Tresea Heine MD, Fayrene Fearing.  INR POC: 1.9.  Cuvette Lot#: [id number].  Exp: 10/2010.    Anticoagulation Management Assessment/Plan:      The patient's current anticoagulation dose is Coumadin 5 mg tabs: Take as directed by coumadin clinic..  The target INR is 2.0-3.0.  The next INR is due 10/03/2009.  Anticoagulation instructions were given to patient.  Results were reviewed/authorized by Cloyde Reams, RN, BSN.  He was notified by Cloyde Reams RN.         Prior Anticoagulation Instructions: INR 3.6 Continue 5mg s daily except 7.5mg s on Mondays, Wednesdays and Fridays. Recheck in one week.   Current Anticoagulation Instructions: INR 1.9  Take 1 tablet daily except 1.5 tablets on Wednesdays.  Recheck in 4 weeks.

## 2010-07-16 NOTE — Progress Notes (Signed)
Summary: amiodarone and warfarin interaction--attempt to see patient  Phone Note Outgoing Call   Call placed by: Bethena Midget, RN Call placed to: Patient Summary of Call: Note received from [facility] regarding amiodarone and warfarin interaction.  Patient d/cd from [facility] on 1/9 for follow-up asap in [facility].  Patient had scheduled next follow-up for 1/21.  Hadley Pen, RN called patient to educate patient re: interaction, risk for increased INR and potential for bleeding.  Offered to reschedule appt to today or tomorrow.  Patient refused.   Initial call taken by: Shelby Dubin PharmD, BCPS, CPP,  July 04, 2009 9:16 AM

## 2010-07-16 NOTE — Assessment & Plan Note (Signed)
Summary: F/U 4 weeks/nm      Allergies Added: NKDA  Visit Type:  Follow-up Primary Jarrod Bodkins:  Dr [name]   History of Present Illness: The patient presents today for routine electrophysiology followup. He reports doing very well since last being seen in our clinic.  He is maintaining sinus rhythm off amiodarone.  He has joined [employer] and is trying to lose weight.  He continues to drink ETOH on the weekends. The patient denies symptoms of palpitations, chest pain, orthopnea, PND, lower extremity edema, dizziness, presyncope, syncope, or neurologic sequela.  He has occasional SOB.  The patient is tolerating medications without difficulties and is otherwise without complaint today.   Current Medications (verified): 1)  Crestor 10 Mg  Tabs (Rosuvastatin Calcium) .... Take 1 Tab By Mouth At Bedtime 2)  Nasonex 50 Mcg/act  Susp (Mometasone Furoate) .... 2 Sprays Each Nostril Once Daily 3)  Symbicort 80-4.5 Mcg/act  Aero (Budesonide-Formoterol Fumarate) .... 2 Puffs First Thing  in Am and 2 Puffs Again in Pm About 12 Hours Later 4)  Carvedilol 12.5 Mg Tabs (Carvedilol) .... 2 Tabs By Mouth  Every Morning and 2every Evening 5)  Diovan 320 Mg Tabs (Valsartan) .[name].. 1 Once Daily 6)  Furosemide 80 Mg Tabs (Furosemide) .... Take 1/2 Tablet By Mouth Daily 7)  Klor-Con M20 20 Meq Cr-Tabs (Potassium Chloride Crys Cr) .... Two Tablets Daily 8)  Vitamin B-12 500 Mcg  Tabs (Cyanocobalamin) .... Take 1 Tablet By Mouth Once A Day 9)  [place]cad[place] .... Once Daily 10)  Cpap 14cm .... At Bedtime 11)  Xopenex Hfa 45 Mcg/act Aero (Levalbuterol Tartrate) .... 2 Every 4 Hours As Needed 12)  Afrin Nasal Spray 0.05 %  Soln (Oxymetazoline Hcl) .... 2 Puffs Two Times A Day For 5 Days Prn 13)  Viagra 100 Mg  Tabs (Sildenafil Citrate) .... Take As Directed/prn 14)  Pradaxa 150 Mg Caps (Dabigatran Etexilate Mesylate) .... One By Mouth Two Times A Day 15)  Synthroid 125 Mcg Tabs (Levothyroxine Sodium) .... Take One  Tablet Daily  Allergies (verified): No Known Drug Allergies  Past History:  Past Medical History: Reviewed history from 02/06/2010 and no changes required. Afib (Persistent) 02/2009 Tachycardia mediated CM, now resolved with resolution of MR/TR   [facility] 03/14/2009 one RCA  30% stenosis, o/w no coronary artery disease Hyperlipidemia    - Target LDL < 70  (chf, single vessel coronary artery disease) Morbid obesity   - Target wt  =  219   for BMI < 30  Hypertension Sleep Apnea   - original dx 2001   - cpap titiration 2002 > 14 cm ideal Diverticulosis and polyps.............................................................[name] KitchenJarold Motto     - colonoscopy 04/03/2009 Chronic Rhinitis Health Maintenance......................................................................[name] KitchenWert   - CPX  March 09, 2009    - Td March 09, 2009    - Pnuemovax 6/08  Past Surgical History: Reviewed history from 09/05/2009 and no changes required.  cardioversion of atrial fibrillation.  05/04/2009  and 2/11  Social History: Reviewed history from 03/28/2009 and no changes required. Never smoker Lives in [place] and works for [employer] ETOH a few times per wk (working on cessation)  Review of Systems       All systems are reviewed and negative except as listed in the HPI.   Vital Signs:  Patient profile:   55 year old male Height:      69 inches Weight:      287 pounds BMI:     42.54 Pulse rate:  64 / minute BP sitting:   140 / 90  (left arm)  Vitals Entered By: Laurance Flatten CMA (March 22, 2010 11:00 AM)  Physical Exam  General:  overweight, in no acute distress. Head:  normocephalic and atraumatic Eyes:  PERRLA/EOM intact; conjunctiva and lids normal. Mouth:  Teeth, gums and palate normal. Oral mucosa normal. Neck:  Neck supple, no JVD. No masses, thyromegaly or abnormal cervical nodes. Lungs:  Clear bilaterally to auscultation and percussion. Heart:  Non-displaced PMI, chest  non-tender; regular rate and rhythm, S1, S2 without murmurs, rubs or gallops. Carotid upstroke normal, no bruit. Normal abdominal aortic size, no bruits. Femorals normal pulses, no bruits. Pedals normal pulses. No edema, no varicosities. Abdomen:  Bowel sounds positive; abdomen soft and non-tender without masses, organomegaly, or hernias noted. No hepatosplenomegaly. Msk:  Back normal, normal gait. Muscle strength and tone normal. Pulses:  pulses normal in all 4 extremities Extremities:  No clubbing or cyanosis. Neurologic:  Alert and oriented x 3. Skin:  Intact without lesions or rashes. Cervical Nodes:  no significant adenopathy Psych:  Normal affect.   Impression & Recommendations:  Problem # 1:  ATRIAL FIBRILLATION (ICD-427.31) maintaining sinus rhythm off amiodarone given recent thyroid dysfunction and young age, we will avoid amiodarone as able  continue pradaxa for stroke prevention I have stressed the importance of avoiding ETOH which I believe is his trigger for afib.  We should consider sleep study to r/o OSA.  Problem # 2:  ACUTE CHRONIC COMB SYSTOLIC&DIASTOLIC HEART FAIL (ICD-428.43) stable volume on exam EF normalized by last echo salt restriction advised continue current medical therapy ETOH avoidance should be strict  Problem # 3:  MORBID OBESITY (ICD-278.01) weight loss advised  Problem # 4:  HYPERTENSION (ICD-401.9) stable avoid salt  Problem # 5:  HYPERLIPIDEMIA (ICD-272.4) stable His updated medication list for this problem includes:    Crestor 10 Mg Tabs (Rosuvastatin calcium) .[name]... Take 1 tab by mouth at bedtime  Problem # 6:  UNSPECIFIED HYPOTHYROIDISM (ICD-244.9) continue synthroid I have instructed pt to follow-up with Dr [name] for management of newly diagnosed hypothyroidism.  Patient Instructions: 1)  Your physician recommends that you schedule a follow-up appointment in: 3 months 2)  Your physician recommends that you continue on your current  medications as directed. Please refer to the Current Medication list given to you today. Prescriptions: SYNTHROID 125 MCG TABS (LEVOTHYROXINE SODIUM) Take one tablet daily  #90 x 3   Entered by:   Claris Gladden RN   Authorized by:   Hillis Range, MD   Signed by:   Claris Gladden RN on 03/22/2010   Method used:   Faxed to ...       [facility] (mail-order)             , [place]         Ph: [phone]       Fax: [phone]   RxID:   [id number] PRADAXA 150 MG CAPS (DABIGATRAN ETEXILATE MESYLATE) one by mouth two times a day  #180 x 3   Entered by:   Claris Gladden RN   Authorized by:   Hillis Range, MD   Signed by:   Claris Gladden RN on 03/22/2010   Method used:   Faxed to ...       [facility] (mail-order)             , [place]         Ph: [phone]  Fax: [phone]   RxID:   [id number]

## 2010-07-16 NOTE — Assessment & Plan Note (Signed)
Summary: afib f/u ok per kelly/sl  Medications Added AMIODARONE HCL 200 MG TABS (AMIODARONE HCL) Take one tablet by mouth once daily      Allergies Added: NKDA  Visit Type:  Follow-up Primary Provider:  Dr [name]   History of Present Illness: The patient presents today for routine electrophysiology followup. He reports doing very well since last being seen in our clinic.  He has been tolerating amiodarone without difficulty.  His CHF symptoms have resolved.  The patient denies symptoms of palpitations, chest pain, shortness of breath, orthopnea, PND, lower extremity edema, dizziness, presyncope, syncope, or neurologic sequela. The patient is tolerating medications without difficulties and is otherwise without complaint today.   Current Medications (verified): 1)  Bayer Aspirin Ec Low Dose 81 Mg  Tbec (Aspirin) .[name].. 1 By Mouth Once Daily 2)  Crestor 10 Mg  Tabs (Rosuvastatin Calcium) .... Take 1 Tab By Mouth At Bedtime 3)  Nasonex 50 Mcg/act  Susp (Mometasone Furoate) .... 2 Sprays Each Nostril Once Daily 4)  Symbicort 80-4.5 Mcg/act  Aero (Budesonide-Formoterol Fumarate) .... 2 Puffs First Thing  in Am and 2 Puffs Again in Pm About 12 Hours Later 5)  Prilosec Otc 20 Mg Tbec (Omeprazole Magnesium) .... Take  One 30-60 Min Before First Meal of The Day 6)  Carvedilol 12.5 Mg Tabs (Carvedilol) .... 2 Tabs By Mouth  Every Morning and 2every Evening 7)  Diovan 320 Mg Tabs (Valsartan) .[name].. 1 Once Daily 8)  Furosemide 80 Mg Tabs (Furosemide) .... Take One Tablet By Mouth Twice  A Day 9)  Klor-Con M20 20 Meq Cr-Tabs (Potassium Chloride Crys Cr) .[name].. 1 Tab Two Times A Day 10)  Coumadin 5 Mg Tabs (Warfarin Sodium) .... Take As Directed By Coumadin Clinic. 11)  Dilt-Cd 180 Mg Xr24h-Cap (Diltiazem Hcl Coated Beads) .... One Tablet Bid 12)  Vitamin B-12 500 Mcg  Tabs (Cyanocobalamin) .... Take 1 Tablet By Mouth Once A Day 13)  [place] .... Once Daily 14)  Cpap 14cm .... At Bedtime 15)  Xopenex Hfa  45 Mcg/act Aero (Levalbuterol Tartrate) .... 2 Every 4 Hours As Needed 16)  Afrin Nasal Spray 0.05 %  Soln (Oxymetazoline Hcl) .... 2 Puffs Two Times A Day For 5 Days Prn 17)  Tylenol 325 Mg Tabs (Acetaminophen) .... Per Bottle/prn 18)  Oxycodone-Acetaminophen 5-325 Mg Tabs (Oxycodone-Acetaminophen) .[name].. 1-2 Tablets Every 4-6 Hours As Needed 19)  Viagra 100 Mg  Tabs (Sildenafil Citrate) .... Take As Directed/prn 20)  Amiodarone Hcl 200 Mg Tabs (Amiodarone Hcl) .... Take Two Tablets By Mouth Twice A Day  Allergies (verified): No Known Drug Allergies  Past History:  Past Medical History: Reviewed history from 03/28/2009 and no changes required. New Afib (Persistent) 02/2009   2d echo, March 13, 2009.  Study showed     ejection fraction 35%.  There was contrast in the left atrial     appendage.  There is a positive PFO by color Doppler.  Normal right     ventricular size, 3+ MR (moderately severe), moderate tricuspid     regurgitation.   LHC 03/14/2009 one RCA  30% stenosis, o/w no coronary artery disease Hyperlipidemia    - Target LDL < 70  (chf, single vessel coronary artery disease) Morbid obesity   - Target wt  =  219   for BMI < 30  Hypertension Sleep Apnea   - original dx 2001   - cpap titiration 2002 > 14 cm ideal Diverticulosis and polyps.............................................................[name] KitchenJarold Motto     -  colonoscopy 04/03/2009 Chronic Rhinitis Health Maintenance......................................................................[name] KitchenWert   - CPX  March 09, 2009    - Td March 09, 2009    - Pnuemovax 6/08  Past Surgical History: Reviewed history from 06/11/2009 and no changes required.  cardioversion of atrial fibrillation.  05/04/2009   Social History: Reviewed history from 03/28/2009 and no changes required. Never smoker Lives in [place] and works for [employer] ETOH a few times per wk (working on cessation)  Review of Systems       All  systems are reviewed and negative except as listed in the HPI.   Vital Signs:  Patient profile:   55 year old male Height:      69 inches Weight:      278 pounds BMI:     41.20 Pulse rate:   57 / minute BP sitting:   112 / 70  (left arm)  Vitals Entered By: Laurance Flatten CMA (July 27, 2009 9:05 AM)  Physical Exam  General:  Well developed, well nourished, in no acute distress. Head:  normocephalic and atraumatic Eyes:  PERRLA/EOM intact; conjunctiva and lids normal. Nose:  no deformity, discharge, inflammation, or lesions Mouth:  Teeth, gums and palate normal. Oral mucosa normal. Neck:  Neck supple, no JVD. No masses, thyromegaly or abnormal cervical nodes. Lungs:  Clear bilaterally to auscultation and percussion. Heart:  irregular bradycardic rhythm,  no m/r/g Abdomen:  Bowel sounds positive; abdomen soft and non-tender without masses, organomegaly, or hernias noted. No hepatosplenomegaly. Msk:  Back normal, normal gait. Muscle strength and tone normal. Pulses:  pulses normal in all 4 extremities Extremities:  No clubbing or cyanosis. Neurologic:  Alert and oriented x 3.  CNII-XII intact, strength/sensation are intact Skin:  Intact without lesions or rashes. Cervical Nodes:  no significant adenopathy Psych:  Normal affect.    Cardiac Cath  Procedure date:  03/13/2009  Findings:       ASSESSMENT:   1. Nonischemic cardiomyopathy with elevated right heart pressures.   2. Nonobstructive coronary artery disease.      PLAN:  Recommend medical therapy for nonobstructive CAD.  We will   diurese the patient with intravenous Lasix to try to bring down his   filling pressures.  We will restart heparin 8 hours after his sheath is   pulled.  He will continue on Coumadin for further treatment of his   atrial fib per Dr. Johney Frame.           Veverly Fells. Excell Seltzer, MD    TE Echocardiogram  Procedure date:  03/13/2009  Findings:       Study Conclusions    1. Left ventricle:  Systolic function was severely reduced. The      estimated ejection fraction was in the range of 25% to 30%.      Diffuse hypokinesis.   2. Mitral valve: Moderate to severe regurgitation.   3. Left atrium: The atrium was moderately dilated. There was severe      spontaneous echo contrast ('smoke') in the appendage.   4. Right ventricle: Systolic function was mildly reduced.   5. Right atrium: The atrium was mildly to moderately dilated.   6. Atrial septum: There was a patent foramen ovale.   7. Tricuspid valve: Moderate regurgitation.   8. Pericardium, extracardiac: A trivial pericardial effusion was      identified.   Transesophageal echocardiography. 2D and color Doppler. Height:   Height: 185.4cm. Height: 73in. Weight: Weight: 119.1kg. Weight:   262lb. Body mass index: BMI: 34.6kg/m^2.  Body surface area: BSA:   2.12m^2. Patient status: Inpatient. Location: Endoscopy.  Prepared and Electronically Authenticated by   Olga Millers, MD, [facility]   [id number]  Echocardiogram  Procedure date:  03/13/2009  Findings:       Study Conclusions    1. Left ventricle: The cavity size was mildly dilated. There was      moderate concentric hypertrophy. Systolic function was severely      reduced. The estimated ejection fraction was in the range of 25%      to 30%. Diffuse hypokinesis. Doppler parameters are consistent      with high ventricular filling pressure.   2. Mitral valve: Mild regurgitation.   3. Left atrium: The atrium was moderately dilated.   4. Right ventricle: The cavity size was mildly dilated. Systolic      function was mildly reduced.   5. Right atrium: The atrium was moderately dilated.   6. Pulmonary arteries: Systolic pressure was mildly increased. PA      peak pressure: 40mm Hg (S).   Transthoracic echocardiography. M-mode, complete 2D, spectral   Doppler, and color Doppler. Patient status: Inpatient. Location:   Bedside.    EKG  Procedure date:   07/27/2009  Findings:      atypicall appearing atrial flutter (possibly clockwise flutter) flutter waves are positive in inferior leads and negative in V1 V rates 50s  Impression & Recommendations:  Problem # 1:  ATRIAL FIBRILLATION (ICD-427.31) His afib has organized into probably clockwise flutter.  His V rates are much better controlled with amiodarone. We will plan repeat cardioversion now that he has been loaded with amiodarone. We will decrease amiodarone to 200mg  daily. Pt is instructed to hold coreg and diltiazem night before and am of cardioversion. R/B/A to [facility] were discussed in detail with the patient who wishes to proceed. ETOH cessation was also recommended as this is a clear trigger for afib.  His updated medication list for this problem includes:    Bayer Aspirin Ec Low Dose 81 Mg Tbec (Aspirin) .[name]... 1 by mouth once daily    Carvedilol 12.5 Mg Tabs (Carvedilol) .[name]... 2 tabs by mouth  every morning and 2every evening    Coumadin 5 Mg Tabs (Warfarin sodium) .[name]... Take as directed by coumadin clinic.    Amiodarone Hcl 200 Mg Tabs (Amiodarone hcl) .[name]... Take one tablet by mouth once daily  Problem # 2:  ACUTE SYSTOLIC HEART FAILURE (ICD-428.21)  Stable continue current medicines Rate/ rhythm control are important for treatment of tachycardia mediated CM ETOH cessation advised  His updated medication list for this problem includes:    Bayer Aspirin Ec Low Dose 81 Mg Tbec (Aspirin) .[name]... 1 by mouth once daily    Carvedilol 12.5 Mg Tabs (Carvedilol) .[name]... 2 tabs by mouth  every morning and 2every evening    Diovan 320 Mg Tabs (Valsartan) .[name]... 1 once daily    Furosemide 80 Mg Tabs (Furosemide) .[name]... Take one tablet by mouth twice  a day    Coumadin 5 Mg Tabs (Warfarin sodium) .[name]... Take as directed by coumadin clinic.    Dilt-cd 180 Mg Xr24h-cap (Diltiazem hcl coated beads) ..... One tablet bid    Amiodarone Hcl 200 Mg Tabs (Amiodarone hcl) .[name]... Take one tablet by mouth  once daily  Orders: TLB-BMP (Basic Metabolic Panel-BMET) (80048-METABOL) TLB-CBC Platelet - w/Differential (85025-CBCD) TLB-PT (Protime) (85610-PTP) TLB-PTT (85730-PTTL)  His updated medication list for this problem includes:    Bayer Aspirin Ec Low Dose 81 Mg Tbec (  Aspirin) .[name]... 1 by mouth once daily    Carvedilol 12.5 Mg Tabs (Carvedilol) .[name]... 2 tabs by mouth  every morning and 2every evening    Diovan 320 Mg Tabs (Valsartan) .[name]... 1 once daily    Furosemide 80 Mg Tabs (Furosemide) .[name]... Take one tablet by mouth twice  a day    Coumadin 5 Mg Tabs (Warfarin sodium) .[name]... Take as directed by coumadin clinic.    Dilt-cd 180 Mg Xr24h-cap (Diltiazem hcl coated beads) ..... One tablet bid    Amiodarone Hcl 200 Mg Tabs (Amiodarone hcl) .[name]... Take one tablet by mouth once daily  Problem # 3:  HYPERTENSION (ICD-401.9)  stable no changes  His updated medication list for this problem includes:    Bayer Aspirin Ec Low Dose 81 Mg Tbec (Aspirin) .[name]... 1 by mouth once daily    Carvedilol 12.5 Mg Tabs (Carvedilol) .[name]... 2 tabs by mouth  every morning and 2every evening    Diovan 320 Mg Tabs (Valsartan) .[name]... 1 once daily    Furosemide 80 Mg Tabs (Furosemide) .[name]... Take one tablet by mouth twice  a day    Dilt-cd 180 Mg Xr24h-cap (Diltiazem hcl coated beads) ..... One tablet bid  Orders: TLB-BMP (Basic Metabolic Panel-BMET) (80048-METABOL) TLB-CBC Platelet - w/Differential (85025-CBCD) TLB-PT (Protime) (85610-PTP) TLB-PTT (85730-PTTL)  His updated medication list for this problem includes:    Bayer Aspirin Ec Low Dose 81 Mg Tbec (Aspirin) .[name]... 1 by mouth once daily    Carvedilol 12.5 Mg Tabs (Carvedilol) .[name]... 2 tabs by mouth  every morning and 2every evening    Diovan 320 Mg Tabs (Valsartan) .[name]... 1 once daily    Furosemide 80 Mg Tabs (Furosemide) .[name]... Take one tablet by mouth twice  a day    Dilt-cd 180 Mg Xr24h-cap (Diltiazem hcl coated beads) ..... One tablet bid  Problem # 4:   HYPERLIPIDEMIA (ICD-272.4)  stable  His updated medication list for this problem includes:    Crestor 10 Mg Tabs (Rosuvastatin calcium) .[name]... Take 1 tab by mouth at bedtime  His updated medication list for this problem includes:    Crestor 10 Mg Tabs (Rosuvastatin calcium) .[name]... Take 1 tab by mouth at bedtime  Patient Instructions: 1)  Your physician has recommended that you have a cardioversion (DCCV).  Electrical cardioversion uses a jolt of electricity to your heart either through paddles or wired patches attached to your chest. This is a controlled, usually prescheduled, procedure. Defibrillation is done under light anesthesia in the hospital, and you usually go home the day of the procedure. This is done to get your heart back into a normal rhythm. You are not awake for the procedure. Please see the instruction sheet given to you today. 2)  Your physician has recommended you make the following change in your medication: decrease your Amiodarone to 200mg  daily

## 2010-07-16 NOTE — Letter (Signed)
Summary: [employer] Authorization  [facility] Authorization   Imported By: Roderic Ovens 09/17/2009 12:24:24  _____________________________________________________________________  External Attachment:    Type:   Image     Comment:   External Document

## 2010-07-16 NOTE — Assessment & Plan Note (Signed)
Summary: 6wk f/u sl  Medications Added FUROSEMIDE 80 MG TABS (FUROSEMIDE) Take one tablet by mouth daily KLOR-CON M20 20 MEQ CR-TABS (POTASSIUM CHLORIDE CRYS CR) 1 tab daily AMIODARONE HCL 200 MG TABS (AMIODARONE HCL) Take 1/2 tablet by mouth once daily      Allergies Added: NKDA  Visit Type:  Follow-up Primary Provider:  Dr [name]  CC:  shortness of breath.  History of Present Illness: The patient presents today for routine electrophysiology followup. He reports doing very well since last being seen in our clinic.  He has been tolerating amiodarone without difficulty.  His CHF symptoms have improved, though he reports SOB with moderate activity.  The patient denies symptoms of palpitations, chest pain, shortness of breath, orthopnea, PND, lower extremity edema, dizziness, presyncope, syncope, or neurologic sequela. The patient is tolerating medications without difficulties and is otherwise without complaint today.   Current Medications (verified): 1)  Bayer Aspirin Ec Low Dose 81 Mg  Tbec (Aspirin) .[name].. 1 By Mouth Once Daily 2)  Crestor 10 Mg  Tabs (Rosuvastatin Calcium) .... Take 1 Tab By Mouth At Bedtime 3)  Nasonex 50 Mcg/act  Susp (Mometasone Furoate) .... 2 Sprays Each Nostril Once Daily 4)  Symbicort 80-4.5 Mcg/act  Aero (Budesonide-Formoterol Fumarate) .... 2 Puffs First Thing  in Am and 2 Puffs Again in Pm About 12 Hours Later 5)  Prilosec Otc 20 Mg Tbec (Omeprazole Magnesium) .... Take  One 30-60 Min Before First Meal of The Day 6)  Carvedilol 12.5 Mg Tabs (Carvedilol) .... 2 Tabs By Mouth  Every Morning and 2every Evening 7)  Diovan 320 Mg Tabs (Valsartan) .[name].. 1 Once Daily 8)  Furosemide 80 Mg Tabs (Furosemide) .... Take One Tablet By Mouth Twice  A Day 9)  Klor-Con M20 20 Meq Cr-Tabs (Potassium Chloride Crys Cr) .[name].. 1 Tab Two Times A Day 10)  Coumadin 5 Mg Tabs (Warfarin Sodium) .... Take As Directed By Coumadin Clinic. 11)  Dilt-Cd 180 Mg Xr24h-Cap (Diltiazem Hcl Coated  Beads) .... One Tablet Bid 12)  Vitamin B-12 500 Mcg  Tabs (Cyanocobalamin) .... Take 1 Tablet By Mouth Once A Day 13)  [place] .... Once Daily 14)  Cpap 14cm .... At Bedtime 15)  Xopenex Hfa 45 Mcg/act Aero (Levalbuterol Tartrate) .... 2 Every 4 Hours As Needed 16)  Afrin Nasal Spray 0.05 %  Soln (Oxymetazoline Hcl) .... 2 Puffs Two Times A Day For 5 Days Prn 17)  Tylenol 325 Mg Tabs (Acetaminophen) .... Per Bottle/prn 18)  Oxycodone-Acetaminophen 5-325 Mg Tabs (Oxycodone-Acetaminophen) .[name].. 1-2 Tablets Every 4-6 Hours As Needed 19)  Viagra 100 Mg  Tabs (Sildenafil Citrate) .... Take As Directed/prn 20)  Amiodarone Hcl 200 Mg Tabs (Amiodarone Hcl) .... Take One Tablet By Mouth Once Daily  Allergies (verified): No Known Drug Allergies  Past History:  Past Medical History: Reviewed history from 03/28/2009 and no changes required. New Afib (Persistent) 02/2009   2d echo, March 13, 2009.  Study showed     ejection fraction 35%.  There was contrast in the left atrial     appendage.  There is a positive PFO by color Doppler.  Normal right     ventricular size, 3+ MR (moderately severe), moderate tricuspid     regurgitation.   LHC 03/14/2009 one RCA  30% stenosis, o/w no coronary artery disease Hyperlipidemia    - Target LDL < 70  (chf, single vessel coronary artery disease) Morbid obesity   - Target wt  =  219  for BMI < 30  Hypertension Sleep Apnea   - original dx 2001   - cpap titiration 2002 > 14 cm ideal Diverticulosis and polyps.............................................................[name] KitchenJarold Motto     - colonoscopy 04/03/2009 Chronic Rhinitis Health Maintenance......................................................................[name] KitchenWert   - CPX  March 09, 2009    - Td March 09, 2009    - Pnuemovax 6/08  Past Surgical History:  cardioversion of atrial fibrillation.  05/04/2009  and 2/11  Social History: Reviewed history from 03/28/2009 and no changes  required. Never smoker Lives in [place] and works for [employer] ETOH a few times per wk (working on cessation)  Review of Systems       All systems are reviewed and negative except as listed in the HPI.   Vital Signs:  Patient profile:   55 year old male Height:      69 inches Weight:      280 pounds BMI:     41.50 Pulse rate:   49 / minute BP sitting:   118 / 70  (left arm)  Vitals Entered By: Laurance Flatten CMA (September 05, 2009 10:16 AM)  Physical Exam  General:  Well developed, well nourished, in no acute distress. Head:  normocephalic and atraumatic Eyes:  PERRLA/EOM intact; conjunctiva and lids normal. Nose:  no deformity, discharge, inflammation, or lesions Mouth:  Teeth, gums and palate normal. Oral mucosa normal. Neck:  Neck supple, no JVD. No masses, thyromegaly or abnormal cervical nodes. Lungs:  Clear bilaterally to auscultation and percussion. Heart:  Non-displaced PMI, chest non-tender; regular rate and rhythm, S1, S2 without murmurs, rubs or gallops. Carotid upstroke normal, no bruit. Normal abdominal aortic size, no bruits. Femorals normal pulses, no bruits. Pedals normal pulses. No edema, no varicosities. Abdomen:  Bowel sounds positive; abdomen soft and non-tender without masses, organomegaly, or hernias noted. No hepatosplenomegaly. Msk:  Back normal, normal gait. Muscle strength and tone normal. Pulses:  pulses normal in all 4 extremities Extremities:  No clubbing or cyanosis. Neurologic:  Alert and oriented x 3. Skin:  Intact without lesions or rashes. Cervical Nodes:  no significant adenopathy Psych:  Normal affect.   EKG  Procedure date:  09/05/2009  Findings:      sinus bradycardia 49 bpm, PR 208, QT 610, nonspecific  st/ T changes  Impression & Recommendations:  Problem # 1:  ATRIAL FIBRILLATION (ICD-427.31) Maintaining sinus rhythm with amiodarone, though he has had asymptomatic qt prolongation.  We will decrease amiodarone to 100mg  daily  and check Mg, K continue coumadin stop cardizem  Problem # 2:  ACUTE CHRONIC COMB SYSTOLIC&DIASTOLIC HEART FAIL (ICD-428.43) Occasional CHF symptoms, euvolemic on exam decrease lasix to 80mg  daily today and also decrease potassium, pending results of BMET check BNP and repeat echo now that he has been in sinus rhythm  salt restriction and complete ETOH cessation advised daily weights stop cardizem  Problem # 3:  HYPERTENSION (ICD-401.9)  The following medications were removed from the medication list:    Dilt-cd 180 Mg Xr24h-cap (Diltiazem hcl coated beads) ..... One tablet bid His updated medication list for this problem includes:    Bayer Aspirin Ec Low Dose 81 Mg Tbec (Aspirin) .[name]... 1 by mouth once daily    Carvedilol 12.5 Mg Tabs (Carvedilol) .[name]... 2 tabs by mouth  every morning and 2every evening    Diovan 320 Mg Tabs (Valsartan) .[name]... 1 once daily    Furosemide 80 Mg Tabs (Furosemide) .[name]... Take one tablet by mouth daily  The following medications were removed from  the medication list:    Dilt-cd 180 Mg Xr24h-cap (Diltiazem hcl coated beads) ..... One tablet bid His updated medication list for this problem includes:    Bayer Aspirin Ec Low Dose 81 Mg Tbec (Aspirin) .[name]... 1 by mouth once daily    Carvedilol 12.5 Mg Tabs (Carvedilol) .[name]... 2 tabs by mouth  every morning and 2every evening    Diovan 320 Mg Tabs (Valsartan) .[name]... 1 once daily    Furosemide 80 Mg Tabs (Furosemide) .[name]... Take one tablet by mouth twice  a day  Orders: TLB-BMP (Basic Metabolic Panel-BMET) (80048-METABOL) TLB-BNP (B-Natriuretic Peptide) (83880-BNPR) TLB-Magnesium (Mg) (83735-MG) Echocardiogram (Echo)  Problem # 4:  MORBID OBESITY (ICD-278.01) weight loss advised using CPAP for OSA  Other Orders: EKG w/ Interpretation (93000)  Patient Instructions: 1)  Your physician recommends that you schedule a follow-up appointment  with Dr Johney Frame on 09/27/09 after echo 2)  Please change CVRR apt from  09/28/09 to 09/27/09 3)  Your physician has recommended you make the following change in your medication: Decrease Amiodarone to 100mg  daily, decrease Lasix to 80mg  daily, and decrease Potassium to one daily.  Stop Diltiazem 4)  Your physician recommends that you return for lab work today

## 2010-07-16 NOTE — Assessment & Plan Note (Signed)
Summary: per check out/appt time is 11:45/saf  Medications Added FUROSEMIDE 80 MG TABS (FUROSEMIDE) Take 1/2 tablet by mouth daily KLOR-CON M20 20 MEQ CR-TABS (POTASSIUM CHLORIDE CRYS CR) two tablets daily      Allergies Added: NKDA  Primary Provider:  Dr [name]  CC:   .  History of Present Illness: The patient presents today for routine electrophysiology followup. He reports doing very well since last being seen in our clinic.  He has been tolerating amiodarone without difficulty. He has nasal congestion/ rhinitis and has been taking frequent afrin/ decongestants. The patient denies symptoms of palpitations, chest pain, shortness of breath, orthopnea, PND, lower extremity edema, dizziness, presyncope, syncope, or neurologic sequela. The patient is tolerating medications without difficulties and is otherwise without complaint today.   Current Medications (verified): 1)  Bayer Aspirin Ec Low Dose 81 Mg  Tbec (Aspirin) .[name].. 1 By Mouth Once Daily 2)  Crestor 10 Mg  Tabs (Rosuvastatin Calcium) .... Take 1 Tab By Mouth At Bedtime 3)  Nasonex 50 Mcg/act  Susp (Mometasone Furoate) .... 2 Sprays Each Nostril Once Daily 4)  Symbicort 80-4.5 Mcg/act  Aero (Budesonide-Formoterol Fumarate) .... 2 Puffs First Thing  in Am and 2 Puffs Again in Pm About 12 Hours Later 5)  Prilosec Otc 20 Mg Tbec (Omeprazole Magnesium) .... Take  One 30-60 Min Before First Meal of The Day 6)  Carvedilol 12.5 Mg Tabs (Carvedilol) .... 2 Tabs By Mouth  Every Morning and 2every Evening 7)  Diovan 320 Mg Tabs (Valsartan) .[name].. 1 Once Daily 8)  Furosemide 80 Mg Tabs (Furosemide) .... Take One Tablet By Mouth Daily 9)  Klor-Con M20 20 Meq Cr-Tabs (Potassium Chloride Crys Cr) .... Three Tablets Daily 10)  Coumadin 5 Mg Tabs (Warfarin Sodium) .... Take As Directed By Coumadin Clinic. 11)  Vitamin B-12 500 Mcg  Tabs (Cyanocobalamin) .... Take 1 Tablet By Mouth Once A Day 12)  [place] .... Once Daily 13)  Cpap 14cm .... At  Bedtime 14)  Xopenex Hfa 45 Mcg/act Aero (Levalbuterol Tartrate) .... 2 Every 4 Hours As Needed 15)  Afrin Nasal Spray 0.05 %  Soln (Oxymetazoline Hcl) .... 2 Puffs Two Times A Day For 5 Days Prn 16)  Tylenol 325 Mg Tabs (Acetaminophen) .... Per Bottle/prn 17)  Oxycodone-Acetaminophen 5-325 Mg Tabs (Oxycodone-Acetaminophen) .[name].. 1-2 Tablets Every 4-6 Hours As Needed 18)  Viagra 100 Mg  Tabs (Sildenafil Citrate) .... Take As Directed/prn 19)  Amiodarone Hcl 200 Mg Tabs (Amiodarone Hcl) .... Take 1/2 Tablet By Mouth Once Daily  Allergies (verified): No Known Drug Allergies  Past History:  Past Medical History: New Afib (Persistent) 02/2009 Tachycardia mediated CM, now resolved with resolution of MR/TR   [facility] 03/14/2009 one RCA  30% stenosis, o/w no coronary artery disease Hyperlipidemia    - Target LDL < 70  (chf, single vessel coronary artery disease) Morbid obesity   - Target wt  =  219   for BMI < 30  Hypertension Sleep Apnea   - original dx 2001   - cpap titiration 2002 > 14 cm ideal Diverticulosis and polyps.............................................................[name] KitchenJarold Motto     - colonoscopy 04/03/2009 Chronic Rhinitis Health Maintenance......................................................................[name] KitchenWert   - CPX  March 09, 2009    - Td March 09, 2009    - Pnuemovax 6/08  Past Surgical History: Reviewed history from 09/05/2009 and no changes required.  cardioversion of atrial fibrillation.  05/04/2009  and 2/11  Social History: Reviewed history from 03/28/2009 and no changes required. Never smoker Lives  in [place] and works for [employer] E[employer] a few times per wk (working on cessation)  Review of Systems       All systems are reviewed and negative except as listed in the HPI.   Vital Signs:  Patient profile:   55 year old male Height:      69 inches Weight:      286 pounds BMI:     42.39 Pulse rate:   52 / minute Pulse rhythm:   regular BP  sitting:   171 / 97  (left arm) Cuff size:   large  Vitals Entered By: Judithe Modest CMA (September 27, 2009 11:35 AM)  Physical Exam  General:  Well developed, well nourished, in no acute distress. Head:  normocephalic and atraumatic Mouth:  Teeth, gums and palate normal. Oral mucosa normal. Neck:  Neck supple, no JVD. No masses, thyromegaly or abnormal cervical nodes. Lungs:  Clear bilaterally to auscultation and percussion. Heart:  Non-displaced PMI, chest non-tender; regular rate and rhythm, S1, S2 without murmurs, rubs or gallops. Carotid upstroke normal, no bruit. Normal abdominal aortic size, no bruits. Femorals normal pulses, no bruits. Pedals normal pulses. No edema, no varicosities. Abdomen:  Bowel sounds positive; abdomen soft and non-tender without masses, organomegaly, or hernias noted. No hepatosplenomegaly. Msk:  Back normal, normal gait. Muscle strength and tone normal. Pulses:  pulses normal in all 4 extremities Extremities:  No clubbing or cyanosis. Neurologic:  Alert and oriented x 3. Skin:  Intact without lesions or rashes.   Impression & Recommendations:  Problem # 1:  ATRIAL FIBRILLATION (ICD-427.31) Maintaining sinus rhythm with low dose amiodarone.  He has moderate LA dilitation.  His CHADS 2 score is 2.  We discussed pradaxa as an alternative to coumadin and he would prefer to continue coumadin. If he continues to do well, we will consider trying either tikosyn, flecainide, or multaq as an alternative to amiodarone longterm. We could also consider catheter ablation down the road.  Problem # 2:  ACUTE SYSTOLIC HEART FAILURE (ICD-428.21) Tachycardia mediated CM has resolved decrease lasix to 40mg  daily today.  If weight remains stable, stop lasix in 2 wks taper kcl and repeat BMET in 3-4 weeks  Problem # 3:  SLEEP APNEA (ICD-780.57) complaince with CPAP encouraged  Problem # 4:  HYPERTENSION (ICD-401.9)  avoid afrin and decongestants continue nasal steroids  and use nasal saline spray instead of afrin if able  His updated medication list for this problem includes:    Bayer Aspirin Ec Low Dose 81 Mg Tbec (Aspirin) .[name]... 1 by mouth once daily    Carvedilol 12.5 Mg Tabs (Carvedilol) .[name]... 2 tabs by mouth  every morning and 2every evening    Diovan 320 Mg Tabs (Valsartan) .[name]... 1 once daily    Furosemide 80 Mg Tabs (Furosemide) .[name]... Take 1/2 tablet by mouth daily  Patient Instructions: 1)  Your physician recommends that you schedule a follow-up appointment in: 3 months with Dr Johney Frame 2)  Your physician has recommended you make the following change in your medication: decrease Lasix to 40mg  once daily and decrease potassium to 2 pills daily if weight is stable will stop Lasix and decrease Potassium to once daily 3)  Your physician recommends that you return for lab work on 10/26/09  BMP 401.1

## 2010-07-16 NOTE — Medication Information (Signed)
Summary: rov/ewj      Allergies Added: NKDA Anticoagulant Therapy  Managed by: Cloyde Reams, RN, BSN Referring MD: Dr. Johney Frame PCP: Dr Nolon Lennert MD: Tenny Craw MD, Gunnar Fusi Indication 1: Atrial Fibrillation Indication 2: - Lab Used: [facility] [place] Site: [address] INR POC 2.1 INR RANGE 2-3  Dietary changes: no    Health status changes: no    Bleeding/hemorrhagic complications: no    Recent/future hospitalizations: no    Any changes in medication regimen? no    Recent/future dental: no  Any missed doses?: yes     Details: Missed 1 dose last Sunday.    Is patient compliant with meds? yes       Allergies (verified): No Known Drug Allergies  Anticoagulation Management History:      The patient is taking warfarin and comes in today for a routine follow up visit.  Negative risk factors for bleeding include an age less than 5 years old.  The bleeding index is 'low risk'.  Positive CHADS2 values include History of CHF and History of HTN.  Negative CHADS2 values include Age > 34 years old.  His last INR was 4.1 ratio.  Anticoagulation responsible provider: Tenny Craw MD, Gunnar Fusi.  INR POC: 2.1.  Cuvette Lot#: [id number].  Exp: 09/2010.    Anticoagulation Management Assessment/Plan:      The patient's current anticoagulation dose is Coumadin 5 mg tabs: Take as directed by coumadin clinic..  The target INR is 2.0-3.0.  The next INR is due 08/02/2009.  Anticoagulation instructions were given to patient.  Results were reviewed/authorized by Cloyde Reams, RN, BSN.  He was notified by Cloyde Reams RN.         Prior Anticoagulation Instructions: INR 2.5  Continue on same dosage 1 tablet daily except 1.5 tablets on Mondays, Wednesdays, and Fridays.  Recheck in 1 week.    Current Anticoagulation Instructions: INR 2.1  Take 2 tablets today and take 1.5 tomorrow, then resume same dosage 1 tablet daily except 1.5 tablets on Mondays, Wednesdays, and Fridays.  Recheck on  Thursdays.

## 2010-07-16 NOTE — Medication Information (Signed)
Summary: [name]  Anticoagulant Therapy  Managed by: Cloyde Reams, RN, BSN Referring MD: Dr. Johney Frame PCP: Dr [name] Supervising MD: Johney Frame MD, Fayrene Fearing Indication 1: Atrial Fibrillation Indication 2: - Lab Used: [facility] [place] Site: [address] INR POC 2.5 INR RANGE 2-3  Dietary changes: no    Health status changes: no    Bleeding/hemorrhagic complications: no    Recent/future hospitalizations: no    Any changes in medication regimen? no    Recent/future dental: no  Any missed doses?: yes     Details: Missed 07/18/09 dosage.    Is patient compliant with meds? yes       Allergies (verified): No Known Drug Allergies  Anticoagulation Management History:      The patient is taking warfarin and comes in today for a routine follow up visit.  Negative risk factors for bleeding include an age less than 62 years old.  The bleeding index is 'low risk'.  Positive CHADS2 values include History of CHF and History of HTN.  Negative CHADS2 values include Age > 92 years old.  His last INR was 4.1 ratio.  Anticoagulation responsible provider: Wenona Mayville MD, Fayrene Fearing.  INR POC: 2.5.  Exp: 09/2010.    Anticoagulation Management Assessment/Plan:      The patient's current anticoagulation dose is Coumadin 5 mg tabs: Take as directed by coumadin clinic..  The target INR is 2.0-3.0.  The next INR is due 07/27/2009.  Anticoagulation instructions were given to patient.  Results were reviewed/authorized by Cloyde Reams, RN, BSN.  He was notified by Cloyde Reams, RN, BSN.         Prior Anticoagulation Instructions: INR 2.3  Continue 1 tab daily except 1.5 tabs Mondays, Wednesdays, Fridays.  OK to have teeth cleaned with INR less than 3.0.  Call clinic with questions [phone],  Recheck in   Current Anticoagulation Instructions: INR 2.5  Continue on same dosage 1 tablet daily except 1.5 tablets on Mondays, Wednesdays, and Fridays.  Recheck in 1 week.

## 2010-07-16 NOTE — Medication Information (Signed)
Summary: [name] coumadin - [facility]  Anticoagulant Therapy  Managed by: Weston Brass, PharmD Referring MD: Dr. Johney Frame PCP: Dr Nolon Lennert MD: Antoine Poche MD, Fayrene Fearing Indication 1: Atrial Fibrillation Indication 2: - Lab Used: [facility] [place] Site: [address] INR POC 1.7 INR RANGE 2-3  Dietary changes: no    Health status changes: no    Bleeding/hemorrhagic complications: no    Recent/future hospitalizations: no    Any changes in medication regimen? no    Recent/future dental: no  Any missed doses?: yes     Details: missed 3-4 days about a week ago- ran out of medication   Is patient compliant with meds? yes       Allergies: No Known Drug Allergies  Anticoagulation Management History:      The patient is taking warfarin and comes in today for a routine follow up visit.  Negative risk factors for bleeding include an age less than 60 years old.  The bleeding index is 'low risk'.  Positive CHADS2 values include History of CHF and History of HTN.  Negative CHADS2 values include Age > 59 years old.  His last INR was 2.1 ratio.  Anticoagulation responsible provider: Antoine Poche MD, Fayrene Fearing.  INR POC: 1.7.  Cuvette Lot#: [id number].  Exp: 01/2011.    Anticoagulation Management Assessment/Plan:      The patient's current anticoagulation dose is Coumadin 5 mg tabs: Take as directed by coumadin clinic..  The target INR is 2.0-3.0.  The next INR is due 11/16/2009.  Anticoagulation instructions were given to patient.  Results were reviewed/authorized by Weston Brass, PharmD.  He was notified by Weston Brass PharmD.         Prior Anticoagulation Instructions: INR 1.8  Coumadin 1 and 1/2 tab = 7.5mg  today 4/14  then 1 tab = 5mg  each day except Wed take 1 and 1/2 tab  Current Anticoagulation Instructions: INR 1.7  Take 1 1/2 tablets today then resume same dose of 1 tablet every day except 1 1/2 tablets on Wednesday

## 2010-07-16 NOTE — Medication Information (Signed)
Summary: rov/ewj  Anticoagulant Therapy  Managed by: Leota Sauers, PharmD, BCPS, CPP Referring MD: Dr. Johney Frame PCP: Dr [name] Supervising MD: Gala Romney MD, Reuel Boom Indication 1: Atrial Fibrillation Indication 2: - Lab Used: [facility] [place] Site: [address] INR POC 1.8 INR RANGE 2-3  Dietary changes: no    Health status changes: no    Bleeding/hemorrhagic complications: no    Recent/future hospitalizations: no    Any changes in medication regimen? no    Recent/future dental: no  Any missed doses?: yes     Details: 1 or 2  Is patient compliant with meds? yes       Current Medications (verified): 1)  Bayer Aspirin Ec Low Dose 81 Mg  Tbec (Aspirin) .[name].. 1 By Mouth Once Daily 2)  Crestor 10 Mg  Tabs (Rosuvastatin Calcium) .... Take 1 Tab By Mouth At Bedtime 3)  Nasonex 50 Mcg/act  Susp (Mometasone Furoate) .... 2 Sprays Each Nostril Once Daily 4)  Symbicort 80-4.5 Mcg/act  Aero (Budesonide-Formoterol Fumarate) .... 2 Puffs First Thing  in Am and 2 Puffs Again in Pm About 12 Hours Later 5)  Prilosec Otc 20 Mg Tbec (Omeprazole Magnesium) .... Take  One 30-60 Min Before First Meal of The Day 6)  Carvedilol 12.5 Mg Tabs (Carvedilol) .... 2 Tabs By Mouth  Every Morning and 2every Evening 7)  Diovan 320 Mg Tabs (Valsartan) .[name].. 1 Once Daily 8)  Furosemide 80 Mg Tabs (Furosemide) .... Take One Tablet By Mouth Daily 9)  Klor-Con M20 20 Meq Cr-Tabs (Potassium Chloride Crys Cr) .... Three Tablets Daily 10)  Coumadin 5 Mg Tabs (Warfarin Sodium) .... Take As Directed By Coumadin Clinic. 11)  Vitamin B-12 500 Mcg  Tabs (Cyanocobalamin) .... Take 1 Tablet By Mouth Once A Day 12)  [place] .... Once Daily 13)  Cpap 14cm .... At Bedtime 14)  Xopenex Hfa 45 Mcg/act Aero (Levalbuterol Tartrate) .... 2 Every 4 Hours As Needed 15)  Afrin Nasal Spray 0.05 %  Soln (Oxymetazoline Hcl) .... 2 Puffs Two Times A Day For 5 Days Prn 16)  Tylenol 325 Mg Tabs (Acetaminophen) .... Per  Bottle/prn 17)  Oxycodone-Acetaminophen 5-325 Mg Tabs (Oxycodone-Acetaminophen) .[name].. 1-2 Tablets Every 4-6 Hours As Needed 18)  Viagra 100 Mg  Tabs (Sildenafil Citrate) .... Take As Directed/prn 19)  Amiodarone Hcl 200 Mg Tabs (Amiodarone Hcl) .... Take 1/2 Tablet By Mouth Once Daily  Allergies (verified): No Known Drug Allergies  Anticoagulation Management History:      The patient is taking warfarin and comes in today for a routine follow up visit.  Negative risk factors for bleeding include an age less than 56 years old.  The bleeding index is 'low risk'.  Positive CHADS2 values include History of CHF and History of HTN.  Negative CHADS2 values include Age > 56 years old.  His last INR was 2.1 ratio.  Anticoagulation responsible provider: Nilson Tabora MD, Reuel Boom.  INR POC: 1.8.  Cuvette Lot#: [id number].  Exp: 10/2010.    Anticoagulation Management Assessment/Plan:      The patient's current anticoagulation dose is Coumadin 5 mg tabs: Take as directed by coumadin clinic..  The target INR is 2.0-3.0.  The next INR is due 10/25/2009.  Anticoagulation instructions were given to patient.  Results were reviewed/authorized by Leota Sauers, PharmD, BCPS, CPP.         Prior Anticoagulation Instructions: INR 1.9  Take 1 tablet daily except 1.5 tablets on Wednesdays.  Recheck in 4 weeks.  Current Anticoagulation Instructions: INR 1.8  Coumadin 1 and 1/2 tab = 7.5mg  today 4/14  then 1 tab = 5mg  each day except Wed take 1 and 1/2 tab

## 2010-07-16 NOTE — Miscellaneous (Signed)
Summary: IV for Definity Echo  Clinical Lists Changes     IV 20G angiocath (R) AC for Definity Echo. Mailin Coglianese,RN. 

## 2010-07-16 NOTE — Assessment & Plan Note (Signed)
Summary: Primary svc/ cpx > estosterone low, hematuria, needs urology eva   Primary [name]/Referring [name]:  Dr [name]  CC:  cpx fasting.  History of Present Illness: 34 yowm never smoker  with morbid obesity complicated by hypertension, hyperlipidemia, and sleep apnea for which he has been using 14 cm CPAP since 2002.    January 24, 2009 ov 2 weeks worsening sob, cough> white to slt pink, gen chest tightness and increased need for rescue over baseline use which is rare.   -using ventolin inhaler often.   March 09, 2009 ov breathing some better and  was supposed to come in for cpx but pulse 140 on arrival, not aware of palpitations though a bit more sob than baseline feels fine after symbicort.  --new onset afib.   March 12, 2009-admit for afib  March 22, 2009 ov post hosp f/u much better.    April 10, 2009- Presents for follow up and med review. We reviewed meds and made a med calendar today. He has been seen at [facility] Dr. Johney Frame today, coreg increased for rate control, HR remains elevated , now he is taking Coreg 25mg  qam, and 12.5mg  qpm. and lasix 40mg  increased to 3 tabs daily.   April 26, 2010 not seen > one year, returns for  cpx fasting.  Pt denies any significant sore throat, dysphagia, itching, sneezing,  nasal congestion or excess secretions,  fever, chills, sweats, unintended wt loss, pleuritic or exertional cp, hempoptysis, change in activity tolerance  orthopnea pnd or leg swelling. Does complain of daytime fatigue  Current Medications (verified): 1)  Crestor 10 Mg  Tabs (Rosuvastatin Calcium) .... Take 1 Tab By Mouth At Bedtime 2)  Nasonex 50 Mcg/act  Susp (Mometasone Furoate) .... 2 Sprays Each Nostril Once Daily 3)  Symbicort 80-4.5 Mcg/act  Aero (Budesonide-Formoterol Fumarate) .... 2 Puffs First Thing  in Am and 2 Puffs Again in Pm About 12 Hours Later 4)  Carvedilol 25 Mg Tabs (Carvedilol) .[name].. 1 Two Times A Day 5)  Diovan 320 Mg Tabs (Valsartan) .[name]..  1 Once Daily 6)  Furosemide 80 Mg Tabs (Furosemide) .... Take 1/2 Tablet By Mouth Daily 7)  Klor-Con M20 20 Meq Cr-Tabs (Potassium Chloride Crys Cr) .... Two Tablets Daily 8)  Vitamin B-12 500 Mcg  Tabs (Cyanocobalamin) .... Take 1 Tablet By Mouth Once A Day 9)  [place] .... Once Daily 10)  Cpap 14cm .... At Bedtime 11)  Xopenex Hfa 45 Mcg/act Aero (Levalbuterol Tartrate) .... 2 Every 4 Hours As Needed 12)  Afrin Nasal Spray 0.05 %  Soln (Oxymetazoline Hcl) .... 2 Puffs Two Times A Day For 5 Days Prn 13)  Viagra 100 Mg  Tabs (Sildenafil Citrate) .... Take As Directed/prn 14)  Pradaxa 150 Mg Caps (Dabigatran Etexilate Mesylate) .... One By Mouth Two Times A Day 15)  Synthroid 125 Mcg Tabs (Levothyroxine Sodium) .... Take One Tablet Daily 16)  Multivitamins  Tabs (Multiple Vitamin) .[name].. 1 Once Daily 17)  Vitamin C 500 Mg Tabs (Ascorbic Acid) .[name].. 1 Once Daily 18)  Co Q-10 100 Mg Caps (Coenzyme Q10) .[name].. 1 Two Times A Day  Allergies (verified): No Known Drug Allergies  Past History:  Past Medical History: Afib (Persistent) 02/2009 Tachycardia mediated CM, resolved with resolution of MR/TR   [facility] 03/14/2009 one RCA  30% stenosis, o/w no coronary artery disease Hyperlipidemia    - Target LDL < 70  (chf, single vessel coronary artery disease) Morbid obesity   - Target wt  =  219   for BMI < 30  Hypertension Sleep Apnea   - original dx 2001   - cpap titiration 2002 > 14 cm ideal   - Retitration ordered April 26, 2010  Diverticulosis and polyps.............................................................[name] KitchenJarold Motto     - colonoscopy 04/03/2009 Chronic Rhinitis Health Maintenance......................................................................[name] KitchenWert   - CPX  April 26, 2010     - Td March 09, 2009    - Pnuemovax 6/08  Family History: Reviewed history from 03/08/2008 and no changes required.  hypertension in mother poor circulation in the legs of his father one younger  brother is healthy  Social History: Reviewed history from 03/28/2009 and no changes required. Never smoker Lives in [place] and works for [employer] ETOH a few times per wk (working on cessation)  Vital Signs:  Patient profile:   55 year old male Weight:      290.38 pounds O2 Sat:      95 % on Room air Temp:     98.0 degrees F oral Pulse rate:   97 / minute BP sitting:   140 / 92  (left arm) Cuff size:   large  Vitals Entered By: Vernie Murders (April 26, 2010 8:54 AM)  O2 Flow:  Room air  Physical Exam  Additional Exam:  wt  267 March 08, 2008  >278 March 12, 2009 > 267 March 22, 2009 >276 April 13, 2009 > 290 April 26, 2010  in general he is a tan moderately obese pleasant white male in no acute distress Afeb with normal vital signs except for heart rate HEENT: nl dentition, and orophanx. Nl external ear canals without cough reflex. Severe nonspecific turbinate swelling Neck without JVD/Nodes/TM/ normal carotids without bruit Lungs clear to A and P, diminshed in bases  IRIR  no s3 or murmur or increase in P2/no displaced PMI  tr  edema Abd soft and benign with nl excursion in the supine position.  Ext warm without calf tenderness, cyanosis clubbing  GU Testes down bilaterally, no atrophy Recal  mild bph, no nodules, stool G neg     Cholesterol               148 mg/dL                   1-478     ATP III Classification            Desirable:  < 200 mg/dL                    Borderline High:  200 - 239 mg/dL               High:  > = 240 mg/dL   Triglycerides             96.0 mg/dL                  2.9-562.1     Normal:  <150 mg/dL     Borderline High:  308 - 199 mg/dL   HDL                       65.78 mg/dL                 >46.96   VLDL Cholesterol          19.2 mg/dL                  2.9-52.8   LDL Cholesterol  80 mg/dL                    1-61  CHO/HDL Ratio:  CHD Risk                             3                    Men          Women      1/2 Average Risk     3.4          3.3     Average Risk          5.0          4.4     2X Average Risk          9.6          7.1     3X Average Risk          15.0          11.0                           Tests: (2) BMP (METABOL)   Sodium                    144 mEq/L                   135-145   Potassium                 4.2 mEq/L                   3.5-5.1   Chloride                  105 mEq/L                   96-112   Carbon Dioxide       [H]  33 mEq/L                    19-32   Glucose              [H]  127 mg/dL                   09-60   BUN                  [H]  27 mg/dL                    4-54   Creatinine                1.4 mg/dL                   0.9-8.1   Calcium                   9.2 mg/dL                   1.9-14.7   GFR                       55.40 mL/min                >60  Tests: (3) CBC Platelet w/Diff (CBCD)   White Cell Count  6.2 K/uL                    4.5-10.5   Red Cell Count            4.93 Mil/uL                 4.22-5.81   Hemoglobin                16.2 g/dL                   29.5-28.4   Hematocrit                45.9 %                      39.0-52.0   MCV                       93.1 fl                     78.0-100.0   MCHC                      35.2 g/dL                   13.2-44.0   RDW                       13.1 %                      11.5-14.6   Platelet Count            180.0 K/uL                  150.0-400.0   Neutrophil %              67.3 %                      43.0-77.0   Lymphocyte %              20.6 %                      12.0-46.0   Monocyte %                7.8 %                       3.0-12.0   Eosinophils%              3.4 %                       0.0-5.0   Basophils %               0.9 %                       0.0-3.0   Neutrophill Absolute      4.2 K/uL                    1.4-7.7   Lymphocyte Absolute       1.3 K/uL                    0.7-4.0   Monocyte Absolute  0.5 K/uL                    0.1-1.0  Eosinophils, Absolute                              0.2 K/uL                    0.0-0.7   Basophils Absolute        0.1 K/uL                    0.0-0.1  Tests: (4) Hepatic/Liver Function Panel (HEPATIC)   Total Bilirubin           0.9 mg/dL                   5.7-8.4   Direct Bilirubin          0.1 mg/dL                   6.9-6.2   Alkaline Phosphatase      57 U/L                      39-117   AST                       26 U/L                      0-37   ALT                       26 U/L                      0-53   Total Protein             7.2 g/dL                    9.5-2.8   Albumin                   4.2 g/dL                    4.1-3.2  Tests: (5) TSH (TSH)   FastTSH              [H]  8.38 uIU/mL                 0.35-5.50  Tests: (6) B-Type Natiuretic Peptide (BNPR)  B-Type Natriuetic Peptide                        [H]  152.8 pg/mL                 0.0-100.0  Tests: (7) Testosterone, Total (TESTO)   Testosterone         [L]  283.77 ng/dL                440.10-272.53  Tests: (8) UDip w/Micro (URINE)   Color                     LT. YELLOW       RANGE:  Yellow;Lt. Yellow   Clarity                   CLEAR  Clear   Specific Gravity          1.010                       1.000 - 1.030   Urine Ph                  6.5                         5.0-8.0   Protein                   NEGATIVE                    Negative   Urine Glucose             NEGATIVE                    Negative   Ketones                   NEGATIVE                    Negative   Urine Bilirubin           NEGATIVE                    Negative   Blood                     LARGE                       Negative   Urobilinogen              0.2                         0.0 - 1.0   Leukocyte Esterace        NEGATIVE                    Negative   Nitrite                   NEGATIVE                    Negative   Urine WBC                 3-6/hpf                     0-2/hpf   Urine RBC                 21-50/hpf                   0-2/hpf   Urine Mucus                Presence of                 None   Urine Epith               Few(5-10/hpf)               Rare(0-4/hpf)   Urine Bacteria            Rare(<10/hpf)               None  CXR  Procedure date:  04/26/2010  Findings:        1.  Low lung volumes. 2.  No active cardiopulmonary disease.  Impression & Recommendations:  Problem # 1:  UNSPECIFIED HYPOTHYROIDISM (ICD-244.9)  His updated medication list for this problem includes:    Synthroid 125 Mcg Tabs (Levothyroxine sodium) .[name]... Take one tablet daily  Labs Reviewed: TSH: 55.23 (02/06/2010)   > 8  April 26, 2010  contnue per cards Chol: 145 (03/09/2009)   HDL: 54.40 (03/09/2009)   LDL: 82 (03/09/2009)   TG: 42.0 (03/09/2009)  Problem # 2:  HYPERLIPIDEMIA (ICD-272.4)  His updated medication list for this problem includes:    Crestor 10 Mg Tabs (Rosuvastatin calcium) .[name]... Take 1 tab by mouth at bedtime  Labs Reviewed: SGOT: 28 (02/06/2010)   SGPT: 23 (02/06/2010)   HDL:54.40 (03/09/2009), 84.4 (03/08/2008)  LDL:82 (03/09/2009), 81 (03/08/2008) > 80 April 26, 2010   Chol:145 (03/09/2009), 176 (03/08/2008)  Trig:42.0 (03/09/2009), 53 (03/08/2008)  Problem # 3:  SLEEP APNEA (ICD-780.57)  Orders: Misc. Referral (Misc. Ref) Titration due  Problem # 4:  HEMATURIA (ICD-599.70) on pradaxa,  testosterone low, rec urology consult  Medications Added to Medication List This Visit: 1)  Multivitamins Tabs (Multiple vitamin) .[name].. 1 once daily 2)  Vitamin C 500 Mg Tabs (Ascorbic acid) .[name].. 1 once daily 3)  Co Q-10 100 Mg Caps (Coenzyme q10) .[name].. 1 two times a day  Other Orders: TLB-Lipid Panel (80061-LIPID) TLB-BMP (Basic Metabolic Panel-BMET) (80048-METABOL) TLB-CBC Platelet - w/Differential (85025-CBCD) TLB-Hepatic/Liver Function Pnl (80076-HEPATIC) TLB-TSH (Thyroid Stimulating Hormone) (84443-TSH) TLB-BNP (B-Natriuretic Peptide) (83880-BNPR) TLB-Udip ONLY (81003-UDIP) TLB-Testosterone, Total (84403-TESTO) T-2 View CXR  (71020TC) Est. Patient 40-64 years (16109)  Patient Instructions: 1)  See Tammy NP w/in 2 weeks with all your medications, even over the counter meds, separated in two separate bags, the ones you take no matter what vs the ones you stop once you feel better and take only as needed.  She will generate for you a new user friendly medication calendar that will put [place] all on the same page re: your medication use.  2)  Tammy will review labs - bring pill box 3)  See Patient Care Coordinator before leaving for sleep titration 4)  We will send [facility] a year supply rx 5)  Weight control is simply a matter of calorie balance which needs to be tilted in your favor by eating less and exercising more.  To get the most out of exercise, you need to be continuously aware that you are short of breath, but never out of breath, for 30 minutes daily. As you improve, it will actually be easier for you to do the same amount in  30 minutes so always push to the level where you are short of breath.  If this does not result in gradual weight reduction,  I recommend  a nutritionist for a food diary 6)  LATE ADD: on pradaxa,  testosterone low, rec urology consult

## 2010-07-16 NOTE — Letter (Signed)
Summary: [employer]   Imported By: Marylou Mccoy 08/02/2009 09:20:31  _____________________________________________________________________  External Attachment:    Type:   Image     Comment:   External Document

## 2010-07-16 NOTE — Medication Information (Signed)
Summary: Coumadin Clinic  Anticoagulant Therapy  Managed by: Inactive Referring MD: Dr. Johney Frame PCP: Dr [name] Supervising MD: Johney Frame MD, Fayrene Fearing Indication 1: Atrial Fibrillation Indication 2: - Lab Used: [facility]  Site: [address] INR RANGE 2-3          Comments: Pt switched to Pradaxa per Dr. Johney Frame   Allergies: No Known Drug Allergies  Anticoagulation Management History:      Negative risk factors for bleeding include an age less than 4 years old.  The bleeding index is 'low risk'.  Positive CHADS2 values include History of CHF and History of HTN.  Negative CHADS2 values include Age > 69 years old.  His last INR was 2.1 ratio.  Anticoagulation responsible provider: Casper Pagliuca MD, Fayrene Fearing.  Exp: 03/2011.    Anticoagulation Management Assessment/Plan:      The patient's current anticoagulation dose is Coumadin 5 mg tabs: Take as directed by coumadin clinic..  The target INR is 2.0-3.0.  The next INR is due 02/15/2010.  Anticoagulation instructions were given to patient.  Results were reviewed/authorized by Inactive.         Prior Anticoagulation Instructions: INR 1.4 Today take 10mg s, Thursday take 7.5mg s then resume 5mg s everyday except 7.5mg s on Wednesdays. Recheck in 10 days.

## 2010-07-16 NOTE — Medication Information (Signed)
Summary: [facility]  Anticoagulant Therapy  Managed by: Bethena Midget, RN Referring MD: Dr. Johney Frame PCP: Dr [name] Supervising MD: Daleen Squibb MD, Maisie Fus Indication 1: Atrial Fibrillation Indication 2: - Lab Used: [facility] [place] Site: [address] INR POC 3.1 INR RANGE 2-3  Dietary changes: no    Health status changes: no    Bleeding/hemorrhagic complications: no    Recent/future hospitalizations: no    Any changes in medication regimen? yes       Details: Started amiodarone 1/6.   Any missed doses?: no       Is patient compliant with meds? yes       Allergies: No Known Drug Allergies  Anticoagulation Management History:      The patient is taking warfarin and comes in today for a routine follow up visit.  Negative risk factors for bleeding include an age less than 54 years old.  The bleeding index is 'low risk'.  Positive CHADS2 values include History of CHF and History of HTN.  Negative CHADS2 values include Age > 83 years old.  His last INR was 4.1 ratio.  Anticoagulation responsible provider: Daleen Squibb MD, Maisie Fus.  INR POC: 3.1.  Cuvette Lot#: [id number].  Exp: 07/2010.    Anticoagulation Management Assessment/Plan:      The patient's current anticoagulation dose is Coumadin 5 mg tabs: Take as directed by coumadin clinic..  The target INR is 2.0-3.0.  The next INR is due 07/13/2009.  Anticoagulation instructions were given to patient.  Results were reviewed/authorized by Bethena Midget, RN.  He was notified by Lew Dawes, PharmD Candidate.         Prior Anticoagulation Instructions: INR = 4.5  The patient's dosage of coumadin will be decreased.  The new dosage includes: hold today's dose and tomorrow's dose.  Then 1.5 tablet every day except 1 tablet on Tuesdays, Thursdays, and Saturdays.    Current Anticoagulation Instructions: INR 3.1  Take 0.5 tablet today then change dose to 1 tablet daily except 1.5 tablets on Mondays, Wednesdays, and Fridays. Recheck in 1  week.

## 2010-07-16 NOTE — Progress Notes (Signed)
Summary: pt needs refill   Phone Note Refill Request Call back at Home Phone [phone] Message from:  [facility] on [facility]  Refills Requested: Medication #1:  SYNTHROID 125 MCG TABS Take one tablet daily Initial call taken by: Omer Jack,  May 16, 2010 12:19 PM  Follow-up for Phone Call        Synthroid was increased today by Rubye Oaks, NP. A new a new script was sent to [facility] by her office. Follow-up by: Laurance Flatten CMA,  May 17, 2010 4:22 PM

## 2010-07-16 NOTE — Progress Notes (Signed)
Summary: refill meds/ question regarding medication   Medications Added FUROSEMIDE 80 MG TABS (FUROSEMIDE) Take one tablet by mouth twice  a day AMIODARONE HCL 200 MG TABS (AMIODARONE HCL) Take two tablets by mouth twice a day       Phone Note Call from Patient Call back at Home Phone [phone] Message from:  Patient on June 29, 2009 8:42 AM  Refills Requested: Medication #1:  FUROSEMIDE 40 MG TABS 2 tab by mouth twice a day amiodarone 200 mg. [facility]. 90 day suppy with 3 refill.    Method Requested: Fax to Mail Away Pharmacy Initial call taken by: Lorne Skeens,  June 29, 2009 8:43 AM Caller: Patient Reason for Call: Talk to Nurse Details for Reason: has question regarding medication , pt was given this medication from his  friend ,wants to know is this safe to take until he get his meds.  metolacone 5 mg.  Initial call taken by: Lorne Skeens,  June 29, 2009 8:48 AM  Follow-up for Phone Call        The pt currently takes Furosemide 80mg  one tablet by mouth twice a day #180,  the pt also needs a Rx for Amiodarone 200mg  take 2 tablets by mouth twice a day (need from hospitalization).  The pt has enough Furosemide to last one week and wanted to know if he could take Metolazone if he ran out before [facility] shipped his Furosemide.  I made the pt aware that he CANNOT take Metolazone.  The pt will call the office for a local Rx if he runs out of Furosemide.  Follow-up by: Julieta Gutting, RN, BSN,  June 29, 2009 9:05 AM    New/Updated Medications: FUROSEMIDE 80 MG TABS (FUROSEMIDE) Take one tablet by mouth twice  a day AMIODARONE HCL 200 MG TABS (AMIODARONE HCL) Take two tablets by mouth twice a day Prescriptions: AMIODARONE HCL 200 MG TABS (AMIODARONE HCL) Take two tablets by mouth twice a day  #360 x 4   Entered by:   Julieta Gutting, RN, BSN   Authorized by:   Nathen May, MD, [facility]   Signed by:   Julieta Gutting, RN, BSN on 06/29/2009   Method used:    Electronically to        [facility] (mail-order)             ,          Ph: [phone]       Fax: [phone]   RxID:   [id number] FUROSEMIDE 80 MG TABS (FUROSEMIDE) Take one tablet by mouth twice  a day  #180 x 4   Entered by:   Julieta Gutting, RN, BSN   Authorized by:   Nathen May, MD, [facility]   Signed by:   Julieta Gutting, RN, BSN on 06/29/2009   Method used:   Electronically to        [facility] (mail-order)             ,          Ph: [phone]       Fax: [phone]   RxID:   [id number]

## 2010-07-16 NOTE — Assessment & Plan Note (Signed)
Summary: NP follow up - med calendar   Primary Provider/Referring Provider:  Dr [name]  CC:  est med calendar - pt brought all meds with him today.[name]  History of Present Illness: 55 yowm never smoker  with morbid obesity complicated by hypertension, hyperlipidemia, and sleep apnea for which he has been using 14 cm CPAP since 2002.    January 24, 2009 ov 2 weeks worsening sob, cough> white to slt pink, gen chest tightness and increased need for rescue over baseline use which is rare.   -using ventolin inhaler often.   March 09, 2009 ov breathing some better and  was supposed to come in for cpx but pulse 140 on arrival, not aware of palpitations though a bit more sob than baseline feels fine after symbicort.  --new onset afib.   March 12, 2009-admit for afib  March 22, 2009 ov post hosp f/u much better.    April 10, 2009- Presents for follow up and med review. We reviewed meds and made a med calendar today. He has been seen at [facility] Dr. Johney Frame today, coreg increased for rate control, HR remains elevated , now he is taking Coreg 25mg  qam, and 12.5mg  qpm. and lasix 40mg  increased to 3 tabs daily.   April 26, 2010 not seen > one year, returns for  cpx fasting.  >>labs showed subtherapeutic hypothyroid/low testosterone and hematuria.   05/17/10--Pt presents for follow up and med review. We reveiwed all his meds and organized them into a med calendar. Reviewed labs from last ov. Labs showed subtherapeutic hypothyroidism w/ TSH ~8 , low testosterone and microscopic hematuria. We discussed options today for testosterone levels and he opts for androderm patches. Will repeat urine w/ cx. Denies urinary symptoms. Does have fatigue, low energy. Denies chest pain, dyspnea, orthopnea, hemoptysis, fever, n/v/d, edema, headache.     Medications Prior to Update: 1)  Pradaxa 150 Mg Caps (Dabigatran Etexilate Mesylate) .... One By Mouth Two Times A Day 2)  Crestor 10 Mg  Tabs (Rosuvastatin  Calcium) .... Take 1 Tab By Mouth At Bedtime 3)  Nasonex 50 Mcg/act  Susp (Mometasone Furoate) .... 2 Sprays Each Nostril Once Daily 4)  Symbicort 80-4.5 Mcg/act  Aero (Budesonide-Formoterol Fumarate) .... 2 Puffs First Thing  in Am and 2 Puffs Again in Pm About 12 Hours Later 5)  Muscadine .... Once Daily 6)  Carvedilol 25 Mg Tabs (Carvedilol) .[name].. 1 Two Times A Day 7)  Diovan 320 Mg Tabs (Valsartan) .[name].. 1 Once Daily 8)  Furosemide 80 Mg Tabs (Furosemide) .... Take 1/2 Tablet By Mouth Daily 9)  Klor-Con M20 20 Meq Cr-Tabs (Potassium Chloride Crys Cr) .... Two Tablets Daily 10)  Multivitamins  Tabs (Multiple Vitamin) .[name].. 1 Once Daily 11)  Vitamin C 500 Mg Tabs (Ascorbic Acid) .[name].. 1 Once Daily 12)  Cpap 14cm .... At Bedtime 13)  Synthroid 125 Mcg Tabs (Levothyroxine Sodium) .... Take One Tablet Daily 14)  Xopenex Hfa 45 Mcg/act Aero (Levalbuterol Tartrate) .... 2 Every 4 Hours As Needed 15)  Afrin Nasal Spray 0.05 %  Soln (Oxymetazoline Hcl) .... 2 Puffs Two Times A Day For 5 Days Prn 16)  Viagra 100 Mg  Tabs (Sildenafil Citrate) .... Take As Directed/prn 17)  Vitamin B-12 500 Mcg  Tabs (Cyanocobalamin) .... Take 1 Tablet By Mouth Once A Day 18)  Co Q-10 100 Mg Caps (Coenzyme Q10) .[name].. 1 Two Times A Day  Current Medications (verified): 1)  Pradaxa 150 Mg Caps (Dabigatran Etexilate Mesylate) .[name] KitchenMarland KitchenMarland Kitchen  One By Mouth Two Times A Day 2)  Crestor 10 Mg  Tabs (Rosuvastatin Calcium) .... Take 1 Tab By Mouth Every Evening 3)  Nasonex 50 Mcg/act  Susp (Mometasone Furoate) .... 2 Sprays Each Nostril Once Daily 4)  Symbicort 80-4.5 Mcg/act  Aero (Budesonide-Formoterol Fumarate) .... 2 Puffs First Thing  in Am and 2 Puffs Again in Pm About 12 Hours Later 5)  Muscadine .... Once Daily 6)  Carvedilol 25 Mg Tabs (Carvedilol) .[name].. 1 Two Times A Day 7)  Diovan 320 Mg Tabs (Valsartan) .[name].. 1 Once Daily 8)  Furosemide 40 Mg Tabs (Furosemide) .... 1/2 Tab By Mouth  Every Morning 9)  Klor-Con M20 20 Meq Cr-Tabs  (Potassium Chloride Crys Cr) .... Take 1 Tablet By Mouth Two Times A Day 10)  Multivitamins  Tabs (Multiple Vitamin) .[name].. 1 Once Daily 11)  Vitamin C 500 Mg Tabs (Ascorbic Acid) .[name].. 1 Once Daily 12)  Cpap 14cm .... At Bedtime 13)  Levothyroxine Sodium 150 Mcg Tabs (Levothyroxine Sodium) .[name].. 1 By Mouth Once Daily 14)  Androderm 5 Mg/24hr Pt24 (Testosterone) .... Apply Daily 15)  Xopenex Hfa 45 Mcg/act Aero (Levalbuterol Tartrate) .... 2 Every 4 Hours As Needed 16)  Afrin Nasal Spray 0.05 %  Soln (Oxymetazoline Hcl) .... 2 Puffs Two Times A Day For 5 Days Prn 17)  Tylenol 325 Mg Tabs (Acetaminophen) .... Per Bottle 18)  Viagra 100 Mg  Tabs (Sildenafil Citrate) .... Take As Directed 19)  Melatonin 3 Mg Tabs (Melatonin) .... Take 1 Tab By Mouth At Bedtime As Needed 20)  Oxycodone-Acetaminophen 5-500 Mg Caps (Oxycodone-Acetaminophen) .[name].. 1 By Mouth Every 6hrs As Needed Pain  Allergies (verified): No Known Drug Allergies  Past History:  Past Surgical History: Last updated: 09/05/2009  cardioversion of atrial fibrillation.  05/04/2009  and 2/11  Family History: Last updated: 03/08/2008  hypertension in mother poor circulation in the legs of his father one younger brother is healthy  Social History: Last updated: 03/28/2009 Never smoker Lives in [place] and works for [employer] ETOH a few times per wk (working on cessation)  Risk Factors: Smoking Status: never (01/24/2008)  Past Medical History: Afib (Persistent) 02/2009 Tachycardia mediated CM, resolved with resolution of MR/TR   [facility] 03/14/2009 one RCA  30% stenosis, o/w no coronary artery disease Hyperlipidemia    - Target LDL < 70  (chf, single vessel coronary artery disease) Morbid obesity   - Target wt  =  219   for BMI < 30  Hypertension Sleep Apnea   - original dx 2001   - cpap titiration 2002 > 14 cm ideal   - Retitration ordered April 26, 2010  Diverticulosis and  polyps.............................................................[name] KitchenJarold Motto     - colonoscopy 04/03/2009 Chronic Rhinitis Health Maintenance......................................................................[name] KitchenWert   - CPX  April 26, 2010     - Td March 09, 2009    - Pnuemovax 6/08 COMPLEX MED REGIMEN-Meds reviewed with pt education and computerized med calendar completed,05/18/10  Review of Systems      See HPI  Vital Signs:  Patient profile:   55 year old male Height:      69 inches Weight:      288.50 pounds BMI:     42.76 O2 Sat:      98 % on Room air Temp:     96.5 degrees F oral Pulse rate:   81 / minute BP sitting:   126 / 78  (right arm) Cuff size:   large  Vitals Entered By: Shanda Bumps  Jones CNA/MA (May 17, 2010 11:17 AM)  O2 Flow:  Room air CC: est med calendar - pt brought all meds with him today. Is Patient Diabetic? No Comments Medications reviewed with patient Daytime contact number verified with patient. Boone Master CNA/MA  May 17, 2010 11:17 AM    Physical Exam  Additional Exam:  wt  267 March 08, 2008  >278 March 12, 2009 > 267 March 22, 2009 >276 April 13, 2009 > 290 April 26, 2010 >>288 05/18/10 in general he is a tan moderately obese pleasant white male in no acute distress Afeb with normal vital signs except for heart rate HEENT: nl dentition, and orophanx. Nl external ear canals without cough reflex. Severe nonspecific turbinate swelling Neck without JVD/Nodes/TM/ normal carotids without bruit Lungs clear to A and P, diminshed in bases  IRIR  no s3 or murmur or increase in P2/no displaced PMI  tr  edema Abd soft and benign with nl excursion in the supine position.  Ext warm without calf tenderness, cyanosis clubbing     Impression & Recommendations:  Problem # 1:  UNSPECIFIED HYPOTHYROIDISM (ICD-244.9)  Increase Syntrhoid once daily  repeat tsh in 4 weeks Meds reviewed with pt education and computerized  med calendar completed  His updated medication list for this problem includes:    Levothyroxine Sodium 150 Mcg Tabs (Levothyroxine sodium) .[name]... 1 by mouth once daily  Orders: Est. Patient Level IV (16109)  Problem # 2:  HEMATURIA (ICD-599.70) repeat urine shows microscopic hematuria.  he is on pradaxa.  refer to urology for eval.   Orders: T-Urinalysis (60454-09811) T-Urine Microscopic (91478-29562) T-Culture, Urine ([id number]) Urology Referral (Urology) Est. Patient Level IV (96295)  Problem # 3:  HYPOGONADISM (ICD-257.2)  low testeosterone.  Add androderm patch daily   recheck level in 4 weeks.   Orders: Est. Patient Level IV (28413)  Medications Added to Medication List This Visit: 1)  Crestor 10 Mg Tabs (Rosuvastatin calcium) .... Take 1 tab by mouth every evening 2)  Furosemide 40 Mg Tabs (Furosemide) .... 1/2 tab by mouth  every morning 3)  Klor-con M20 20 Meq Cr-tabs (Potassium chloride crys cr) .... Take 1 tablet by mouth two times a day 4)  Levothyroxine Sodium 150 Mcg Tabs (Levothyroxine sodium) .[name].. 1 by mouth once daily 5)  Androderm 5 Mg/24hr Pt24 (Testosterone) .... Apply daily 6)  Tylenol 325 Mg Tabs (Acetaminophen) .... Per bottle 7)  Viagra 100 Mg Tabs (Sildenafil citrate) .... Take as directed 8)  Melatonin 3 Mg Tabs (Melatonin) .... Take 1 tab by mouth at bedtime as needed 9)  Oxycodone-acetaminophen 5-500 Mg Caps (Oxycodone-acetaminophen) .[name].. 1 by mouth every 6hrs as needed pain  Complete Medication List: 1)  Pradaxa 150 Mg Caps (Dabigatran etexilate mesylate) .... One by mouth two times a day 2)  Crestor 10 Mg Tabs (Rosuvastatin calcium) .... Take 1 tab by mouth every evening 3)  Nasonex 50 Mcg/act Susp (Mometasone furoate) .... 2 sprays each nostril once daily 4)  Symbicort 80-4.5 Mcg/act Aero (Budesonide-formoterol fumarate) .... 2 puffs first thing  in am and 2 puffs again in pm about 12 hours later 5)  Muscadine  .... Once daily 6)  Carvedilol  25 Mg Tabs (Carvedilol) .[name].. 1 two times a day 7)  Diovan 320 Mg Tabs (Valsartan) .[name].. 1 once daily 8)  Furosemide 40 Mg Tabs (Furosemide) .... 1/2 tab by mouth  every morning 9)  Klor-con M20 20 Meq Cr-tabs (Potassium chloride crys cr) .... Take  1 tablet by mouth two times a day 10)  Multivitamins Tabs (Multiple vitamin) .[name].. 1 once daily 11)  Vitamin C 500 Mg Tabs (Ascorbic acid) .[name].. 1 once daily 12)  Cpap 14cm  .... At bedtime 13)  Levothyroxine Sodium 150 Mcg Tabs (Levothyroxine sodium) .[name].. 1 by mouth once daily 14)  Androderm 5 Mg/24hr Pt24 (Testosterone) .... Apply daily 15)  Xopenex Hfa 45 Mcg/act Aero (Levalbuterol tartrate) .... 2 every 4 hours as needed 16)  Afrin Nasal Spray 0.05 % Soln (Oxymetazoline hcl) .... 2 puffs two times a day for 5 days prn 17)  Tylenol 325 Mg Tabs (Acetaminophen) .... Per bottle 18)  Viagra 100 Mg Tabs (Sildenafil citrate) .... Take as directed 19)  Melatonin 3 Mg Tabs (Melatonin) .... Take 1 tab by mouth at bedtime as needed 20)  Oxycodone-acetaminophen 5-500 Mg Caps (Oxycodone-acetaminophen) .[name].. 1 by mouth every 6hrs as needed pain  Patient Instructions: 1)  Increase Syntrhoid once daily  2)  Add androderm patch daily  3)  I will call with urine results.  4)  follow up 4 weeks  5)  Follow med calendar closely and bring to each visit.  Prescriptions: ANDRODERM 5 MG/24HR PT24 (TESTOSTERONE) apply daily  #90 x 0   Entered and Authorized by:   Rubye Oaks NP   Signed by:   Tammy Parrett NP on 05/17/2010   Method used:   Print then Give to Patient   RxID:   [id number] OXYCODONE-ACETAMINOPHEN 5-500 MG CAPS (OXYCODONE-ACETAMINOPHEN) 1 by mouth every 6hrs as needed pain  #20 x 0   Entered and Authorized by:   Rubye Oaks NP   Signed by:   Tammy Parrett NP on 05/17/2010   Method used:   Print then Give to Patient   RxID:   [id number] NASONEX 50 MCG/ACT  SUSP (MOMETASONE FUROATE) 2 sprays each nostril once daily  #3 x 3    Entered and Authorized by:   Rubye Oaks NP   Signed by:   Tammy Parrett NP on 05/17/2010   Method used:   Faxed to ...       [facility] (mail-order)             , [place]         Ph: [phone]       Fax: [phone]   RxID:   [id number] SYMBICORT 80-4.5 MCG/ACT  AERO (BUDESONIDE-FORMOTEROL FUMARATE) 2 puffs first thing  in am and 2 puffs again in pm about 12 hours later  #3 x 3   Entered and Authorized by:   Rubye Oaks NP   Signed by:   Tammy Parrett NP on 05/17/2010   Method used:   Faxed to ...       [facility] (mail-order)             , [place]         Ph: [phone]       Fax: [phone]   RxID:   [id number] VIAGRA 100 MG  TABS (SILDENAFIL CITRATE) take as directed/prn  #45 x 3   Entered and Authorized by:   Rubye Oaks NP   Signed by:   Tammy Parrett NP on 05/17/2010   Method used:   Faxed to ...       [facility] (mail-order)             , [place]         Ph: [phone]       Fax: [phone]   RxID:  [id number] XOPENEX HFA 45 MCG/ACT AERO (LEVALBUTEROL TARTRATE) 2 every 4 hours as needed  #3 x 0   Entered and Authorized by:   Rubye Oaks NP   Signed by:   Tammy Parrett NP on 05/17/2010   Method used:   Faxed to ...       [facility] (mail-[facility])             , [place]         Ph: [phone]       Fax: [phone]   RxID:   [id number] DIOVAN 320 MG TABS (VALSARTAN) 1 once daily  #90 Tablet x 3   Entered and Authorized by:   Rubye Oaks NP   Signed by:   Tammy Parrett NP on 05/17/2010   Method used:   Faxed to ...       [facility] (mail[facility])             , [place]         Ph: [phone]       Fax: [phone]   RxID:   [id number] NASONEX 50 MCG/ACT  SUSP (MOMETASONE FUROATE) 2 sprays each nostril once daily  #1 x 3   Entered and Authorized by:   Rubye Oaks NP   Signed by:   Tammy Parrett NP on 05/17/2010   Method used:   Faxed to ...       [facility] (mail[facility])             , [place]         Ph: [phone]       Fax: [phone]   RxID:    [id number] CRESTOR 10 MG  TABS (ROSUVASTATIN CALCIUM) Take 1 tab by mouth at bedtime  #90 Tablet x 3   Entered and Authorized by:   Rubye Oaks NP   Signed by:   Tammy Parrett NP on 05/17/2010   Method used:   Faxed to ...       [facility] (mail-[facility])             , [place]         Ph: [phone]       Fax: [phone]   RxID:   [id number] LEVOTHYROXINE SODIUM 150 MCG TABS (LEVOTHYROXINE SODIUM) 1 by mouth once daily  #90 x 3   Entered and Authorized by:   Rubye Oaks NP   Signed by:   Tammy Parrett NP on 05/17/2010   Method used:   Faxed to ...       [facility] (mail-[facility])             , [place]         Ph: [phone]       Fax: [phone]   RxID:   [id number]

## 2010-07-16 NOTE — Progress Notes (Signed)
Summary: rx refill   Phone Note Refill Request Call back at Home Phone [phone] Message from:  Patient on May 15, 2010 2:50 PM  Refills Requested: Medication #1:  CARVEDILOL 25 MG TABS 1 two times a day  Medication #2:  FUROSEMIDE 80 MG TABS Take 1/2 tablet by mouth daily  Medication #3:  KLOR-CON M20 20 MEQ CR-TABS two tablets daily  Medication #4:  PRADAXA 150 MG CAPS one by mouth two times a day [facility]# [phone]. please the pt a call when rx are called in   Method Requested: Telephone to Pharmacy Initial call taken by: Roe Coombs,  May 15, 2010 2:50 PM    Prescriptions: PRADAXA 150 MG CAPS (DABIGATRAN ETEXILATE MESYLATE) one by mouth two times a day  #180 x 3   Entered by:   Laurance Flatten CMA   Authorized by:   Hillis Range, MD   Signed by:   Laurance Flatten CMA on 05/15/2010   Method used:   Faxed to ...       [facility] (mail[facility])             , [place]         Ph: [phone]       Fax: [phone]   RxID:   [id number] KLOR-CON M20 20 MEQ CR-TABS (POTASSIUM CHLORIDE CRYS CR) two tablets daily  #180 x 0   Entered by:   Laurance Flatten CMA   Authorized by:   Hillis Range, MD   Signed by:   Laurance Flatten CMA on 05/15/2010   Method used:   Faxed to ...       [facility] (mail-order)             , [place]         Ph: [phone]       Fax: [phone]   RxID:   [id number] FUROSEMIDE 80 MG TABS (FUROSEMIDE) Take 1/2 tablet by mouth daily  #90 x 3   Entered by:   Laurance Flatten CMA   Authorized by:   Hillis Range, MD   Signed by:   Laurance Flatten CMA on 05/15/2010   Method used:   Faxed to ...       [facility] (mail-order)             , [place]         Ph: [phone]       Fax: [phone]   RxID:   [id number] CARVEDILOL 25 MG TABS (CARVEDILOL) 1 two times a day  #180 x 3   Entered by:   Laurance Flatten CMA   Authorized by:   Hillis Range, MD   Signed by:   Laurance Flatten CMA on 05/15/2010   Method used:   Faxed to ...       [facility] (mail-order)             , [place]           Ph: [phone]       Fax: [phone]   RxID:   [id number]

## 2010-07-16 NOTE — Letter (Signed)
Summary: Handout Printed  Printed Handout:  - Coumadin Instructions-w/out Meds 

## 2010-07-16 NOTE — Medication Information (Signed)
Summary: [name]  Anticoagulant Therapy  Managed by: Bethena Midget, RN, BSN Referring MD: Dr. Johney Frame PCP: Dr [name] Supervising MD: Daleen Squibb MD, Maisie Fus Indication 1: Atrial Fibrillation Indication 2: - Lab Used: [facility] [place] Site: [address] INR POC 2.4 INR RANGE 2-3  Dietary changes: no    Health status changes: no    Bleeding/hemorrhagic complications: no    Recent/future hospitalizations: no    Any changes in medication regimen? no    Recent/future dental: no  Any missed doses?: no       Is patient compliant with meds? yes       Allergies: No Known Drug Allergies  Anticoagulation Management History:      The patient is taking warfarin and comes in today for a routine follow up visit.  Negative risk factors for bleeding include an age less than 58 years old.  The bleeding index is 'low risk'.  Positive CHADS2 values include History of CHF and History of HTN.  Negative CHADS2 values include Age > 22 years old.  His last INR was 2.1 ratio.  Anticoagulation responsible provider: Daleen Squibb MD, Maisie Fus.  INR POC: 2.4.  Cuvette Lot#: [id number].  Exp: 02/2011.    Anticoagulation Management Assessment/Plan:      The patient's current anticoagulation dose is Coumadin 5 mg tabs: Take as directed by coumadin clinic..  The target INR is 2.0-3.0.  The next INR is due 02/06/2010.  Anticoagulation instructions were given to patient.  Results were reviewed/authorized by Bethena Midget, RN, BSN.  He was notified by Bethena Midget, RN, BSN.         Prior Anticoagulation Instructions: INR 1.9  Take 1.5 tablets today, then resume same dosage 1 tablet daily except 1.5 tablets on Wednesdays.  Recheck in 4 weeks.    Current Anticoagulation Instructions: INR 2.4 Continue 5mg s everyday except 7.5mg s on Wednesdays. Recheck in 4 weeks.

## 2010-07-16 NOTE — Letter (Signed)
Summary: Cardioversion/TEE Instructions  Architectural technologist, Main Office  [address]   [place], [place] [address]   Phone: [phone]  Fax: [phone]    Cardioversion   You are scheduled for a Cardioversion on 08/03/09 with Dr.Leydi Winstead   Please arrive at the [facility] at 12:00  p.m. on the day of your procedure.  1)   DIET:   B)   May have clear liquid breakfast, then nothing to eat or drink after 7:00 a.m.      Clear liquids include:  water, broth, Sprite, Ginger Ale, black coffee, tea (no sugar),      cranberry / grape / apple juice, jello (not red), popsicle from clear juices (not red).  2)   Come to the [place] office o 2/11/11for lab work. The lab at [facility] is open from 8:30 a.m. to 1:30 p.m. and 2:30 p.m. to 5:00 p.m. The lab at [address] [facility] is open from 7:30 a.m. to 5:30 p.m. You do not have to be fasting.  3)   MAKE SURE YOU TAKE YOUR COUMADIN.  4)   A)   DO NOT TAKE these medications before your procedure:    Hold your Diltiazem and Carvedilol the p.m. before and a.m. of your procedure  B)   YOU MAY TAKE ALL of your remaining medications with a small amount of water. _  5)  Must have a responsible person to drive you home.  6)   Bring a current list of your medications and current insurance cards.   * Special Note:  Every effort is made to have your procedure done on time. Occasionally there are emergencies that present themselves at the hospital that may cause delays. Please be patient if a delay does occur.  * If you have any questions after you get home, please call the office at [phone]. Anselm Pancoast

## 2010-07-16 NOTE — Assessment & Plan Note (Signed)
Summary: PER CHECK OUT/SF  Medications Added FUROSEMIDE 80 MG TABS (FUROSEMIDE) Take 1/2 tablet by mouth daily PRADAXA 150 MG CAPS (DABIGATRAN ETEXILATE MESYLATE) one by mouth two times a day      Allergies Added: NKDA  Visit Type:  Follow-up Primary [place]:  Dr [name]   History of Present Illness: The patient presents today for routine electrophysiology followup. He reports doing very well since last being seen in our clinic.  He has been tolerating amiodarone without difficulty.  He has unfortunately gained weight but denies symptoms of CHF. The patient denies symptoms of palpitations, chest pain, shortness of breath, orthopnea, PND, lower extremity edema, dizziness, presyncope, syncope, or neurologic sequela. The patient is tolerating medications without difficulties and is otherwise without complaint today.   Current Medications (verified): 1)  Bayer Aspirin Ec Low Dose 81 Mg  Tbec (Aspirin) .[name].. 1 By Mouth Once Daily 2)  Crestor 10 Mg  Tabs (Rosuvastatin Calcium) .... Take 1 Tab By Mouth At Bedtime 3)  Nasonex 50 Mcg/act  Susp (Mometasone Furoate) .... 2 Sprays Each Nostril Once Daily 4)  Symbicort 80-4.5 Mcg/act  Aero (Budesonide-Formoterol Fumarate) .... 2 Puffs First Thing  in Am and 2 Puffs Again in Pm About 12 Hours Later 5)  Prilosec Otc 20 Mg Tbec (Omeprazole Magnesium) .... Take  One 30-60 Min Before First Meal of The Day 6)  Carvedilol 12.5 Mg Tabs (Carvedilol) .... 2 Tabs By Mouth  Every Morning and 2every Evening 7)  Diovan 320 Mg Tabs (Valsartan) .[name].. 1 Once Daily 8)  Furosemide 80 Mg Tabs (Furosemide) .... Take 1/2 Tablet By Mouth Daily 9)  Klor-Con M20 20 Meq Cr-Tabs (Potassium Chloride Crys Cr) .... Two Tablets Daily 10)  Coumadin 5 Mg Tabs (Warfarin Sodium) .... Take As Directed By Coumadin Clinic. 11)  Vitamin B-12 500 Mcg  Tabs (Cyanocobalamin) .... Take 1 Tablet By Mouth Once A Day 12)  Muscadine .... Once Daily 13)  Cpap 14cm .... At Bedtime 14)  Xopenex Hfa 45  Mcg/act Aero (Levalbuterol Tartrate) .... 2 Every 4 Hours As Needed 15)  Afrin Nasal Spray 0.05 %  Soln (Oxymetazoline Hcl) .... 2 Puffs Two Times A Day For 5 Days Prn 16)  Tylenol 325 Mg Tabs (Acetaminophen) .... Per Bottle/prn 17)  Oxycodone-Acetaminophen 5-325 Mg Tabs (Oxycodone-Acetaminophen) .[name].. 1-2 Tablets Every 4-6 Hours As Needed 18)  Viagra 100 Mg  Tabs (Sildenafil Citrate) .... Take As Directed/prn 19)  Amiodarone Hcl 200 Mg Tabs (Amiodarone Hcl) .... Take 1/2 Tablet By Mouth Once Daily  Allergies (verified): No Known Drug Allergies  Past History:  Past Medical History: Afib (Persistent) 02/2009 Tachycardia mediated CM, now resolved with resolution of MR/TR   [facility] 03/14/2009 one RCA  30% stenosis, o/w no coronary artery disease Hyperlipidemia    - Target LDL < 70  (chf, single vessel coronary artery disease) Morbid obesity   - Target wt  =  219   for BMI < 30  Hypertension Sleep Apnea   - original dx 2001   - cpap titiration 2002 > 14 cm ideal Diverticulosis and polyps.............................................................[name] KitchenJarold Motto     - colonoscopy 04/03/2009 Chronic Rhinitis Health Maintenance......................................................................[name] KitchenWert   - CPX  March 09, 2009    - Td March 09, 2009    - Pnuemovax 6/08  Past Surgical History: Reviewed history from 09/05/2009 and no changes required.  cardioversion of atrial fibrillation.  05/04/2009  and 2/11  Social History: Reviewed history from 03/28/2009 and no changes required. Never smoker Lives in [place] and  works for [employer] ETOH a few times per wk (working on cessation)  Review of Systems       All systems are reviewed and negative except as listed in the HPI.   Vital Signs:  Patient profile:   55 year old male Height:      69 inches Weight:      293 pounds BMI:     43.42 Pulse rate:   46 / minute BP sitting:   156 / 100  (left arm)  Vitals Entered By:  Laurance Flatten CMA (February 06, 2010 2:33 PM)  Physical Exam  General:  Well developed, well nourished, in no acute distress. Head:  normocephalic and atraumatic Eyes:  PERRLA/EOM intact; conjunctiva and lids normal. Mouth:  Teeth, gums and palate normal. Oral mucosa normal. Neck:  Neck supple, no JVD. No masses, thyromegaly or abnormal cervical nodes. Lungs:  Clear bilaterally to auscultation and percussion. Heart:  Non-displaced PMI, chest non-tender; regular rate and rhythm, S1, S2 without murmurs, rubs or gallops. Carotid upstroke normal, no bruit. Normal abdominal aortic size, no bruits. Femorals normal pulses, no bruits. Pedals normal pulses. No edema, no varicosities. Abdomen:  Bowel sounds positive; abdomen soft and non-tender without masses, organomegaly, or hernias noted. No hepatosplenomegaly. Msk:  Back normal, normal gait. Muscle strength and tone normal. Pulses:  pulses normal in all 4 extremities Extremities:  No clubbing or cyanosis. Neurologic:  Alert and oriented x 3.   EKG  Procedure date:  02/06/2010  Findings:      bradycardia 46 bpmj, Qtc 484, otherwise normal ekg  Impression & Recommendations:  Problem # 1:  ATRIAL FIBRILLATION (ICD-427.31)  maintaining sinus rhythm with amiodarone 100mg  daily.  we will check tfts and lfts today we will consider stopping amiodarone or starting an alternative AAD upon return if he maintains sinus INRs have been subtherapeutic frequently.  We will stop coumadin and start Pradaxa 150mg  two times a day today.  Orders: TLB-BMP (Basic Metabolic Panel-BMET) (80048-METABOL) TLB-Hepatic/Liver Function Pnl (80076-HEPATIC) TLB-TSH (Thyroid Stimulating Hormone) (84443-TSH) TLB-CBC Platelet - w/Differential (85025-CBCD)  Problem # 2:  ACUTE CHRONIC COMB SYSTOLIC&DIASTOLIC HEART FAIL (ICD-428.43)  stable decrease salt decrease lasix to 40mg  daily BMET today  Orders: TLB-BMP (Basic Metabolic Panel-BMET)  (80048-METABOL) TLB-Hepatic/Liver Function Pnl (80076-HEPATIC) TLB-TSH (Thyroid Stimulating Hormone) (84443-TSH) TLB-CBC Platelet - w/Differential (85025-CBCD)  Problem # 3:  HYPERTENSION (ICD-401.9)  above goal consider adding spironolactone upon return  Orders: TLB-BMP (Basic Metabolic Panel-BMET) (80048-METABOL) TLB-Hepatic/Liver Function Pnl (80076-HEPATIC) TLB-TSH (Thyroid Stimulating Hormone) (84443-TSH) TLB-CBC Platelet - w/Differential (85025-CBCD)  Problem # 4:  MORBID OBESITY (ICD-278.01) weight loss advised  Patient Instructions: 1)  Your physician recommends that you schedule a follow-up appointment in: 3 months with Dr Johney Frame 2)  Your physician has recommended you make the following change in your medication: stop Coumadin and start Pradaxa 150mg  two times a day.  Decrease Lasix 40mg  daily 3)  Salt restrictions  Prescriptions: PRADAXA 150 MG CAPS (DABIGATRAN ETEXILATE MESYLATE) one by mouth two times a day  #60 x 6   Entered by:   Dennis Bast, RN, BSN   Authorized by:   Hillis Range, MD   Signed by:   Dennis Bast, RN, BSN on 02/06/2010   Method used:   [facility]        [facility] [phone][facility] (retail)       [address]       [place], [place]  [address]  Ph: [phone]       Fax: [phone]   RxID:   [id number]

## 2010-07-16 NOTE — Progress Notes (Signed)
Summary: rx request for coreg > change ot 25 mg bid  Phone Note Call from Patient Call back at [employer] Phone [phone] Call back at Work Phone [phone]   Caller: Patient Call For: wert Reason for Call: Refill Medication, Talk to Nurse Summary of Call: Patient uses [facility].  Patient is completely out of carvedilol 12.5mg , asking [place] to call in enough med to local pharm to last him until 11/11 when he has an appt w/ wert.  [facility]  Initial call taken by: Lehman Prom,  April 08, 2010 2:55 PM  Follow-up for Phone Call        Dr [name], last time we saw this pt he was taking carvedilol 6.25 two times a day- now chart states that he is taking 12.5 2 two times a day. Is this okay to refill? Pls advise thanks! Follow-up by: Vernie Murders,  April 08, 2010 3:01 PM  Additional Follow-up for Phone Call Additional follow up Details #1::        change it to 25 mg one two times a day  Additional Follow-up by: Nyoka Cowden MD,  April 08, 2010 4:43 PM    Additional Follow-up for Phone Call Additional follow up Details #2::    Rx was sent.  LMOM for pt TCB Vernie Murders  April 08, 2010 4:54 PM  called spoke with patient, informed him of the change in rx and his pending rx to [facility].  pt verbalized his understanding and will keep the 11.11.11 appt with MW. Boone Master CNA/MA  April 09, 2010 11:22 AM   New/Updated Medications: CARVEDILOL 25 MG TABS (CARVEDILOL) 1 two times a day Prescriptions: CARVEDILOL 25 MG TABS (CARVEDILOL) 1 two times a day  #60 x 0   Entered by:   Vernie Murders   Authorized by:   Nyoka Cowden MD   Signed by:   Vernie Murders on 04/08/2010   Method used:   Electronically to        [facility] [phone]* (retail)       [address]       [place], [place]  [address]       Ph: [phone]       Fax: [phone]   RxID:   [id number]

## 2010-07-16 NOTE — Medication Information (Signed)
Summary: rov/ewj  Anticoagulant Therapy  Managed by: Bethena Midget, RN, BSN Referring MD: Dr. Johney Frame PCP: Dr Nolon Lennert MD: Myrtis Ser MD, Tinnie Gens Indication 1: Atrial Fibrillation Indication 2: - Lab Used: [facility] [place] Site: [address] INR POC 3.6 INR RANGE 2-3  Dietary changes: no    Health status changes: no    Bleeding/hemorrhagic complications: no    Recent/future hospitalizations: yes       Details: Pending DCCV  Any changes in medication regimen? yes       Details: Amiodorone decreased to 200mg s daily from 800mg s daily on 07/27/09  Recent/future dental: no  Any missed doses?: no       Is patient compliant with meds? yes      Comments: DCCV tomorrow   Allergies: No Known Drug Allergies  Anticoagulation Management History:      The patient is taking warfarin and comes in today for a routine follow up visit.  Negative risk factors for bleeding include an age less than 72 years old.  The bleeding index is 'low risk'.  Positive CHADS2 values include History of CHF and History of HTN.  Negative CHADS2 values include Age > 36 years old.  His last INR was 2.1 ratio.  Anticoagulation responsible provider: Myrtis Ser MD, Tinnie Gens.  INR POC: 3.6.  Cuvette Lot#: [id number].  Exp: 09/2010.    Anticoagulation Management Assessment/Plan:      The patient's current anticoagulation dose is Coumadin 5 mg tabs: Take as directed by coumadin clinic..  The target INR is 2.0-3.0.  The next INR is due 08/10/2009.  Anticoagulation instructions were given to patient.  Results were reviewed/authorized by Bethena Midget, RN, BSN.  He was notified by Bethena Midget, RN, BSN.         Prior Anticoagulation Instructions: INR 2.1  Take 2 tablets today and take 1.5 tomorrow, then resume same dosage 1 tablet daily except 1.5 tablets on Mondays, Wednesdays, and Fridays.  Recheck on Thursdays.    Current Anticoagulation Instructions: INR 3.6 Continue 5mg s daily except 7.5mg s on Mondays,  Wednesdays and Fridays. Recheck in one week.

## 2010-07-18 ENCOUNTER — Ambulatory Visit (HOSPITAL_COMMUNITY)
Admission: RE | Admit: 2010-07-18 | Discharge: 2010-07-18 | Disposition: A | Discharge status: Home / Self Care | Payer: 59 | Source: Ambulatory Visit | Attending: Internal Medicine | Admitting: Internal Medicine

## 2010-07-18 NOTE — Progress Notes (Signed)
[facility]  Phone Note Call from Patient Call back at Home Phone [phone]   Caller: Patient Call For: parrett Summary of Call: Pt states that pharmacy doesn't have androderm 5mg   but they do have 4mg  pls advise.//[facility] [address] Initial call taken by: Darletta Moll,  June 04, 2010 8:13 AM  Follow-up for Phone Call        pt called back. has not heard from anyone and neither has his pharmacy. (pt says he called here yesterday).  pls call asap. Tivis Ringer, CNA  June 04, 2010 9:18 AM  called spoke with patient who states that he still has not received the adroderm patch from [facility], so he called them and was told that they no longer carry androderm.  so then pt stated that [facility] [address] and was told that they do not carry the 5mg  androderm, only the 4mg .  i [facility] and spoke with kim, pharmacist who stated that the 5mg  androderm is more common than the 4mg  and accepted a 90day rx for this.  called spoke with pt and informed him of who i spoke with, what i was told and his pending rx.  pt verbalized his understanding. Boone Master CNA/MA  June 04, 2010 9:46 AM     Prescriptions: Cathren Laine 5 MG/24HR PT24 (TESTOSTERONE) apply daily  #90 x 0   Entered by:   Boone Master CNA/MA   Authorized by:   Rubye Oaks NP   Signed by:   Boone Master CNA/MA on 06/04/2010   Method used:   Telephoned to ...       [facility] [phone]* (retail)       [address]       [place], [place]  [address]       Ph: [phone]       Fax: [phone]   RxID:   [id number]

## 2010-07-18 NOTE — Medication Information (Signed)
Summary: Tax adviser   Imported By: Valinda Hoar 05/30/2010 16:36:08  _____________________________________________________________________  External Attachment:    Type:   Image     Comment:   External Document

## 2010-07-18 NOTE — Assessment & Plan Note (Signed)
Summary: NP follow up - androderm patch   Primary Provider/Referring Provider:  Dr [name]  CC:  4 week folllow up androderm - states does feel better but still having some fatigue.  History of Present Illness: 19  yowm never smoker  with morbid obesity complicated by hypertension, hyperlipidemia, and sleep apnea for which he has been using 14 cm CPAP since 2002.    January 24, 2009 ov 2 weeks worsening sob, cough> white to slt pink, gen chest tightness and increased need for rescue over baseline use which is rare.   -using ventolin inhaler often.   March 09, 2009 ov breathing some better and  was supposed to come in for cpx but pulse 140 on arrival, not aware of palpitations though a bit more sob than baseline feels fine after symbicort.  --new onset afib.   March 12, 2009-admit for afib  March 22, 2009 ov post hosp f/u much better.    April 10, 2009- Presents for follow up and med review. We reviewed meds and made a med calendar today. He has been seen at [facility] Dr. Johney Frame today, coreg increased for rate control, HR remains elevated , now he is taking Coreg 25mg  qam, and 12.5mg  qpm. and lasix 40mg  increased to 3 tabs daily.   April 26, 2010 not seen > one year, returns for  cpx fasting.  >>labs showed subtherapeutic hypothyroid/low testosterone and hematuria.   05/17/10--Pt presents for follow up and med review. We reveiwed all his meds and organized them into a med calendar. Reviewed labs from last ov. Labs showed subtherapeutic hypothyroidism w/ TSH ~8 , low testosterone and microscopic hematuria. We discussed options today for testosterone levels and he opts for androderm patches. Will repeat urine w/ cx.  >>increased synthroid , androderm patch, refer to urology .   06/28/10--Presents for follow up. Last ov was started on androderm however has only been on for couple of weeks because of insurance coverage. He is currently following with urology for microscopic  hematuria. He is tolerating androderm well. Denies chest pain, dyspnea, orthopnea, hemoptysis, fever, n/v/d, edema, headache..  Medications Prior to Update: 1)  Pradaxa 150 Mg Caps (Dabigatran Etexilate Mesylate) .... One By Mouth Two Times A Day 2)  Crestor 10 Mg  Tabs (Rosuvastatin Calcium) .... Take 1 Tab By Mouth Every Evening 3)  Nasonex 50 Mcg/act  Susp (Mometasone Furoate) .... 2 Sprays Each Nostril Once Daily 4)  Symbicort 80-4.5 Mcg/act  Aero (Budesonide-Formoterol Fumarate) .... 2 Puffs First Thing  in Am and 2 Puffs Again in Pm About 12 Hours Later 5)  Muscadine .... Once Daily 6)  Carvedilol 25 Mg Tabs (Carvedilol) .[name].. 1 Two Times A Day 7)  Diovan 320 Mg Tabs (Valsartan) .[name].. 1 Once Daily 8)  Furosemide 80 Mg Tabs (Furosemide) .... Take 1/2 Tablet By Mouth Daily. 9)  Klor-Con M20 20 Meq Cr-Tabs (Potassium Chloride Crys Cr) .... Take 1 Tablet By Mouth Two Times A Day 10)  Multivitamins  Tabs (Multiple Vitamin) .[name].. 1 Once Daily 11)  Vitamin C 500 Mg Tabs (Ascorbic Acid) .[name].. 1 Once Daily 12)  Cpap 14cm .... At Bedtime 13)  Levothyroxine Sodium 150 Mcg Tabs (Levothyroxine Sodium) .[name].. 1 By Mouth Once Daily 14)  Androderm 4 Mg/24hr Pt24 (Testosterone) .... Apply Daily 15)  Xopenex Hfa 45 Mcg/act Aero (Levalbuterol Tartrate) .... 2 Every 4 Hours As Needed 16)  Afrin Nasal Spray 0.05 %  Soln (Oxymetazoline Hcl) .... 2 Puffs Two Times A Day For 5 Days  Prn 17)  Tylenol 325 Mg Tabs (Acetaminophen) .... Per Bottle 18)  Viagra 100 Mg  Tabs (Sildenafil Citrate) .... Take As Directed 19)  Melatonin 3 Mg Tabs (Melatonin) .... Take 1 Tab By Mouth At Bedtime As Needed 20)  Oxycodone-Acetaminophen 5-500 Mg Caps (Oxycodone-Acetaminophen) .[name].. 1 By Mouth Every 6hrs As Needed Pain  Allergies (verified): No Known Drug Allergies  Past History:  Past Surgical History: Last updated: 09/05/2009  cardioversion of atrial fibrillation.  05/04/2009  and 2/11  Family History: Last updated:  03/08/2008  hypertension in mother poor circulation in the legs of his father one younger brother is healthy  Social History: Last updated: 06/28/2010 Never smoker Lives in [place] and works for [employer] ETOH a few times per wk (working on cessation) declines flu shot 1.13.12  Risk Factors: Smoking Status: never (01/24/2008)  Past Medical History: Afib (Persistent) 02/2009 Tachycardia mediated CM, resolved with resolution of MR/TR   [facility] 03/14/2009 one RCA  30% stenosis, o/w no coronary artery disease Hyperlipidemia    - Target LDL < 70  (chf, single vessel coronary artery disease) Morbid obesity   - Target wt  =  219   for BMI < 30  Hypertension Sleep Apnea   - original dx 2001   - cpap titiration 2002 > 14 cm ideal   - Retitration ordered April 26, 2010  Diverticulosis and polyps.............................................................[name] KitchenJarold Motto     - colonoscopy 04/03/2009 Chronic Rhinitis Health Maintenance......................................................................[name] KitchenWert   - CPX  April 26, 2010     - Td March 09, 2009    - Pnuemovax 6/08 MICROSCOPIC HEMATURIA --refer to urology 12/11>> COMPLEX MED REGIMEN-Meds reviewed with pt education and computerized med calendar completed,05/18/10  Social History: Never smoker Lives in [place] and works for [employer] ETOH a few times per wk (working on cessation) declines flu shot 1.13.12  Review of Systems      See HPI  Vital Signs:  Patient profile:   55 year old male Height:      69 inches Weight:      292.13 pounds BMI:     43.30 O2 Sat:      94 % on Room air Temp:     97.0 degrees F oral Pulse rate:   99 / minute BP sitting:   150 / 90  (left arm) Cuff size:   large  Vitals Entered By: Boone Master CNA/MA (June 28, 2010 12:20 PM)  O2 Flow:  Room air CC: 4 week folllow up androderm - states does feel better but still having some fatigue Is Patient Diabetic? No Comments  Medications reviewed with patient Daytime contact number verified with patient. Boone Master CNA/MA  June 28, 2010 12:21 PM    Physical Exam  Additional Exam:  wt  267 March 08, 2008  >278 March 12, 2009 > 267 March 22, 2009 >276 April 13, 2009 > 290 April 26, 2010 >>288 05/18/10>>292 06/29/10  in general he is a tan moderately obese pleasant white male in no acute distress Afeb with normal vital signs except for heart rate HEENT: nl dentition, and orophanx. Nl external ear canals without cough reflex. Neck without JVD/Nodes/TM/ normal carotids without bruit Lungs clear to A and P, diminshed in bases  IRIR  no s3 or murmur or increase in P2/no displaced PMI  tr  edema Abd soft and benign with nl excursion in the supine position.  Ext warm without calf tenderness, cyanosis clubbing     Impression & Recommendations:  Problem #  1:  HYPOGONADISM (ICD-257.2)  cont on androderm follow up 6 weeks with repeat labs.   Orders: Est. Patient Level II ([address])  Problem # 2:  HEMATURIA (ICD-599.70)  cont follow up with urology for workup.  Orders: Est. Patient Level II ([address])  Patient Instructions: 1)  follow up urology as planned  2)  follow up 6 weeks  and as needed

## 2010-07-18 NOTE — Progress Notes (Signed)
Summary: Prior Auth for Androderm-form faxed 12.13.11  Phone Note Call from Patient Call back at [facility] Phone [phone] Call back at Work Phone [phone]   Caller: Patient Call For: Parrett Reason for Call: Talk to Nurse Summary of Call: Pt states needs Prior Auth for Androderm 5mg  to call [phone] to initiate same to [facility]. Also was advised once approved can call same refill as needed.  Initial call taken by: Zackery Barefoot CMA,  May 20, 2010 10:47 AM  Follow-up for Phone Call        Called [facility] to initiate PA for androderm.  Case ID number is [id number]. I called and made pt aware that we already got this process started.  Will await form to be faxed.   Follow-up by: Vernie Murders,  May 20, 2010 11:18 AM  Additional Follow-up for Phone Call Additional follow up Details #1::        Form recieved and given to JJ to have TP fill out.  Additional Follow-up by: Vernie Murders,  May 20, 2010 3:19 PM    Additional Follow-up for Phone Call Additional follow up Details #2::    can use androgel instead AndroGel 1% 50mg   4 pumps in am rub into skin , alternate sites. no refills.   Follow-up by: Rubye Oaks NP,  May 22, 2010 7:51 AM  Additional Follow-up for Phone Call Additional follow up Details #3:: Details for Additional Follow-up Action Taken: called [facility], spoke with kay.  asked for covered alternatives per TP's request.  was transferred to pharmacist to seek covered alternatives.  spoke w/ Raynelle Fanning, pharmacist who states that all testosterone therapies will require PA.  will have TP fill out/sign form. Boone Master CNA/MA  May 23, 2010 5:31 PM   form filled out and signed by TP.  faxed back to [facility] and form placed in triage to await coverage review. Boone Master CNA/MA  May 28, 2010 3:56 PM     Approval good 05-07-10 through 05-27-2015. Pt aware of approval.[facility] CMA  May 29, 2010 4:04 PM

## 2010-07-18 NOTE — Letter (Signed)
Summary: ELectrophysiology/Ablation Procedure Instructions  [facility], Main Office  [address]   [place], [place] [address]   Phone: [phone]  Fax: [phone]     Electrophysiology/Ablation Procedure Instructions    You are scheduled for a(n) flutter ablation on 07/18/10 at 12:30pm with Dr. Johney Frame.  1.  Please come to the Short Stay Center at [facility] at 10:30am on the day of your procedure.  2.  Come prepared to stay overnight.   Please bring your insurance cards and a list of your medications.  3.  Come to the [place] office on 07/11/10 for lab work.  .  You do not have to be fasting.  4.  Do not have anything to eat or drink after midnight the night before your procedure.  5.  Do NOT take these medications for the morning of the procedure unless otherwise instructed: Furosemide and Carvedilol.  All of your remaining medications may be taken with a small amount of water.  6.  Educational material received:  Ablation   * Occasionally, EP studies and ablations can become lengthy.  Please make your family aware of this before your procedure starts.  Average time ranges from 2-8 hours for EP studies/ablations.  Your physician will locate your family after the procedure with the results.  * If you have any questions after you get home, please call the office at [phone]. Anselm Pancoast

## 2010-07-18 NOTE — Consult Note (Signed)
Summary: [facility]  [facility]   Imported By: Lester Chico 07/02/2010 07:12:12  _____________________________________________________________________  External Attachment:    Type:   Image     Comment:   External Document

## 2010-07-18 NOTE — Progress Notes (Signed)
Summary: androderm not available in 5mg , ok to change to 4mg  patch  Phone Note Other Incoming   Caller: Kim with [facility] Summary of Call: Selena Batten with [facility] phoned stated that [name] called a rx in for [name]'s Androderm patch. She states that it was called in for 5 mg and it is not available in 5 mg now comes in 4 mg. She would like for someone to call her back with a new rx her phone  number is [phone]  Follow-up for Phone Call        called spoke with Selena Batten at [facility] who states that the androderm is no longer available in the 5mg  and is requesting to change it to 4mg .  Per TP: this is okay to change.  changed on med list w/ refills. Boone Master CNA/MA  June 04, 2010 3:09 PM     New/Updated Medications: * ANDRODERM 4 MG/24HR PT24 (TESTOSTERONE) apply daily Prescriptions: ANDRODERM 4 MG/24HR PT24 (TESTOSTERONE) apply daily  #90 x 0   Entered by:   Boone Master CNA/MA   Authorized by:   Rubye Oaks NP   Signed by:   Boone Master CNA/MA on 06/04/2010   Method used:   Telephoned to ...       [facility] [id number][facility] (retail)       [address]       [place], [place]  [address]       Ph: [phone]       Fax: [phone]   RxID:   [id number]

## 2010-07-18 NOTE — Progress Notes (Signed)
Summary: need to verify medication   Phone Note From Pharmacy Call back at  [phone]   Caller: [facility] ref# [id number] Summary of Call: Need to verify Furosemide Initial call taken by: Judie Grieve,  June 04, 2010 10:14 AM    Prescriptions: FUROSEMIDE 80 MG TABS (FUROSEMIDE) Take 1/2 tablet by mouth daily.  #90 x 5   Entered by:   Laurance Flatten CMA   Authorized by:   Hillis Range, MD   Signed by:   Laurance Flatten CMA on 06/07/2010   Method used:   Electronically to        [facility] (retail)             ,          Ph: [phone]       Fax: [phone]   RxID:   [id number]

## 2010-07-18 NOTE — Progress Notes (Signed)
Summary: [facility] calling with question on med**rtn call**   Phone Note Refill Request Call back at  [phone] Message from:  Pharmacy on [facility]  Refills Requested: Medication #1:  FUROSEMIDE 40 MG TABS 1/2 tab by mouth  every morning ref #[id number] pharmacy has question re furosimide rx  Initial call taken by: Omer Jack,  May 24, 2010 11:25 AM Caller: Patient Initial call taken by: Glynda Jaeger,  May 23, 2010 10:55 AM  Follow-up for Phone Call        pt returning call  to [facility]  May 28, 2010 11:45 AM    pt rtn call to [facility] again Omer Jack  May 28, 2010 4:23 PM     Prescriptions: KLOR-CON M20 20 MEQ CR-TABS (POTASSIUM CHLORIDE CRYS CR) Take 1 tablet by mouth two times a day  #180 x 3   Entered by:   Laurance Flatten CMA   Authorized by:   Hillis Range, MD   Signed by:   Laurance Flatten CMA on 05/28/2010   Method used:   Faxed to ...       [facility] (mail-order)             , [place]         Ph: [phone]       Fax: [phone]   RxID:   [id number] FUROSEMIDE 40 MG TABS (FUROSEMIDE) 1/2 tab by mouth  every morning  #90 x 3   Entered by:   Laurance Flatten CMA   Authorized by:   Hillis Range, MD   Signed by:   Laurance Flatten CMA on 05/28/2010   Method used:   Faxed to ...       [facility] (mail-order)             , [place]         Ph: [phone]       Fax: [phone]   RxID:   [id number] DIOVAN 320 MG TABS (VALSARTAN) 1 once daily  #90 Tablet x 3   Entered by:   Laurance Flatten CMA   Authorized by:   Hillis Range, MD   Signed by:   Laurance Flatten CMA on 05/28/2010   Method used:   Faxed to ...       [facility] (mail-order)             , [place]         Ph: [phone]       Fax: [phone]   RxID:   [id number] CARVEDILOL 25 MG TABS (CARVEDILOL) 1 two times a day  #180 x 3   Entered by:   Laurance Flatten CMA   Authorized by:   Hillis Range, MD   Signed by:   Laurance Flatten CMA on 05/28/2010   Method used:   Faxed to ...       [facility]  (mail-order)             , [place]         Ph: [phone]       Fax: [phone]   RxID:   [id number] CRESTOR 10 MG  TABS (ROSUVASTATIN CALCIUM) Take 1 tab by mouth every evening  #90 x 3   Entered by:   Laurance Flatten CMA   Authorized by:   Hillis Range, MD   Signed by:   Laurance Flatten CMA on 05/28/2010   Method used:   Faxed to ...       [facility] (mail-order)             ,  [place]         Ph: [phone]       Fax: [phone]   RxID:   [id number]

## 2010-07-18 NOTE — Progress Notes (Signed)
Summary: androderm rx > appt rescheduled to 1.13.12  Phone Note Call from Patient Call back at Home Phone [phone]   Caller: Patient Call For: [name] Summary of Call: pt is waiting still for androderm rx was supposed to be on it for a month before next appt please call pt and fix it  Initial call taken by: Lacinda Axon,  May 29, 2010 9:09 AM  Follow-up for Phone Call        androderm PA aprroved thru 2016.  pt was to be on this med x85month to follow up w/ a recheck on testosterone level.  TP out of office this afternoon, will discuss with her and contact patient tomorrow.  spoke with patient regarding this.  he verbalized his understanding.  contact number good for tomorrow as well. Boone Master CNA/MA  May 29, 2010 4:24 PM    Additional Follow-up for Phone Call Additional follow up Details #1::        the order was sent day of ov, his insurance declined  x2 rx, PA was sent and is now improved  will move ov out so he can be on rx for 4 weeks.   Additional Follow-up by: Rubye Oaks NP,  May 30, 2010 9:26 AM    Additional Follow-up for Phone Call Additional follow up Details #2::    LMOM TCB x1. Boone Master CNA/MA  May 30, 2010 10:36 AM   pt returned call.  appt rescheduled to 1.13.12 @ 12n.  pt to call back if he does not receive his androderm shipment. Boone Master CNA/MA  May 30, 2010 11:06 AM

## 2010-07-18 NOTE — Progress Notes (Signed)
Summary: [facility] HAS QUESTIONS RE DIRECTIONS   Phone Note From Other Clinic   Caller: [facility] [phone] Summary of Call: REF [phone] /VERIFY DIRECTIONS FOR FUROSIMIDE Initial call taken by: Glynda Jaeger,  June 19, 2010 4:17 PM  Follow-up for Phone Call        Pt supposed to be on 40mg  1 by mouth daily. Spoke with pt, pt is taking furosemide 80mg  1/2 by mouth two times a day. Advised pt to decrease per Dr. Jenel Lucks note to 40mg  1 by mouth daily and I will advise Dr. Johney Frame and Tresa Endo of the change. Pt notified. Marrion Coy, CNA  June 20, 2010 12:10 PM  Follow-up by: Marrion Coy, CNA,  June 20, 2010 12:10 PM

## 2010-07-23 ENCOUNTER — Telehealth: Payer: Self-pay | Admitting: Internal Medicine

## 2010-07-24 NOTE — Assessment & Plan Note (Signed)
Summary: per check out/sf      Allergies Added: NKDA  Visit Type:  Follow-up Primary Provider:  Dr [name]  CC:  sob. [name]  History of Present Illness: The patient presents today for routine electrophysiology followup. He reports doing very well since last being seen in our clinic.  He reports occasional dypsnea with activity.   He continues to drink ETOH on the weekends. The patient denies symptoms of palpitations, chest pain, orthopnea, PND, lower extremity edema, dizziness, presyncope, syncope, or neurologic sequela.  He has occasional SOB.  The patient is tolerating medications without difficulties and is otherwise without complaint today.   Current Medications (verified): 1)  Pradaxa 150 Mg Caps (Dabigatran Etexilate Mesylate) .... One By Mouth Two Times A Day 2)  Crestor 10 Mg  Tabs (Rosuvastatin Calcium) .... Take 1 Tab By Mouth Every Evening 3)  Nasonex 50 Mcg/act  Susp (Mometasone Furoate) .... 2 Sprays Each Nostril Once Daily 4)  Symbicort 80-4.5 Mcg/act  Aero (Budesonide-Formoterol Fumarate) .... 2 Puffs First Thing  in Am and 2 Puffs Again in Pm About 12 Hours Later 5)  [place] .... Once Daily 6)  Carvedilol 25 Mg Tabs (Carvedilol) .[name].. 1 Two Times A Day 7)  Diovan 320 Mg Tabs (Valsartan) .[name].. 1 Once Daily 8)  Furosemide 80 Mg Tabs (Furosemide) .... Take 1/2 Tablet By Mouth Daily. 9)  Klor-Con M20 20 Meq Cr-Tabs (Potassium Chloride Crys Cr) .... Take 1 Tablet By Mouth Two Times A Day 10)  Multivitamins  Tabs (Multiple Vitamin) .[name].. 1 Once Daily 11)  Vitamin C 500 Mg Tabs (Ascorbic Acid) .[name].. 1 Once Daily 12)  Cpap 14cm .... At Bedtime 13)  Levothyroxine Sodium 150 Mcg Tabs (Levothyroxine Sodium) .[name].. 1 By Mouth Once Daily 14)  Androderm 4 Mg/24hr Pt24 (Testosterone) .... Apply Daily 15)  Xopenex Hfa 45 Mcg/act Aero (Levalbuterol Tartrate) .... 2 Every 4 Hours As Needed 16)  Afrin Nasal Spray 0.05 %  Soln (Oxymetazoline Hcl) .... 2 Puffs Two Times A Day For 5 Days Prn 17)   Tylenol 325 Mg Tabs (Acetaminophen) .... Per Bottle 18)  Viagra 100 Mg  Tabs (Sildenafil Citrate) .... Take As Directed 19)  Melatonin 3 Mg Tabs (Melatonin) .... Take 1 Tab By Mouth At Bedtime As Needed 20)  Oxycodone-Acetaminophen 5-500 Mg Caps (Oxycodone-Acetaminophen) .[name].. 1 By Mouth Every 6hrs As Needed Pain  Allergies (verified): No Known Drug Allergies  Past History:  Past Medical History: Afib (Persistent) 02/2009 Atrial flutter (typical appearing) 07/12/10 Tachycardia mediated CM, resolved with resolution of MR/TR   [facility] 03/14/2009 one RCA  30% stenosis, o/w no coronary artery disease Hyperlipidemia    - Target LDL < 70  (chf, single vessel coronary artery disease) Morbid obesity   - Target wt  =  219   for BMI < 30  Hypertension Sleep Apnea   - original dx 2001   - cpap titiration 2002 > 14 cm ideal   - Retitration ordered April 26, 2010  Diverticulosis and polyps.............................................................[name] KitchenJarold Motto     - colonoscopy 04/03/2009 Chronic Rhinitis Health Maintenance......................................................................[name] KitchenWert   - CPX  April 26, 2010     - Td March 09, 2009    - Pnuemovax 6/08 MICROSCOPIC HEMATURIA --refer to urology 12/11>> COMPLEX MED REGIMEN-Meds reviewed with pt education and computerized med calendar completed,05/18/10  Past Surgical History: Reviewed history from 09/05/2009 and no changes required.  cardioversion of atrial fibrillation.  05/04/2009  and 2/11  Family History: Reviewed history from 03/08/2008 and no changes required.  hypertension in mother poor circulation in the legs of his father one younger brother is healthy  Social History: Reviewed history from 06/28/2010 and no changes required. Never smoker Lives in [place] and works for [employer] ETOH a few times per wk (working on cessation) declines flu shot 1.13.12  Review of Systems       All systems are reviewed  and negative except as listed in the HPI.   Vital Signs:  Patient profile:   55 year old male Height:      69 inches Weight:      285 pounds BMI:     42.24 Pulse rate:   80 / minute BP sitting:   172 / 98  (left arm)  Vitals Entered By: Celestia Khat, CMA (July 12, 2010 1:47 PM)  Physical Exam  General:  overweight, in no acute distress. Head:  normocephalic and atraumatic Eyes:  PERRLA/EOM intact; conjunctiva and lids normal. Mouth:  Teeth, gums and palate normal. Oral mucosa normal. Neck:  Neck supple, no JVD. No masses, thyromegaly or abnormal cervical nodes. Lungs:  Clear bilaterally to auscultation and percussion. Heart:  iRRR, no m/r/g Abdomen:  Bowel sounds positive; abdomen soft and non-tender without masses, organomegaly, or hernias noted. No hepatosplenomegaly. Msk:  Back normal, normal gait. Muscle strength and tone normal. Extremities:  No clubbing or cyanosis. Neurologic:  Alert and oriented x 3. Skin:  Intact without lesions or rashes. Psych:  Normal affect.     Echocardiogram  Procedure date:  09/20/2009  Findings:      Study Conclusions            - Left ventricle: The cavity size was normal. Wall thickness was       increased in a pattern of moderate LVH. Systolic function was       normal. The estimated ejection fraction was 55%. Wall motion was       normal; there were no regional wall motion abnormalities. Doppler       parameters are consistent with abnormal left ventricular       relaxation (grade 1 diastolic dysfunction).     - Aortic valve: There was no stenosis.     - Aorta: Ascending aortic diameter: 43mm (S).     - Ascending aorta: The ascending aorta was mildly dilated.     - Mitral valve: Trivial regurgitation.     - Left atrium: The atrium was moderately dilated.     - Right ventricle: The cavity size was normal. Systolic function was       normal.     - Right atrium: The atrium was mildly dilated.     - Pulmonary arteries: No TR  doppler jet was measured so unable to       estimate PA systolic pressure.     - Inferior vena cava: The vessel was normal in size; the       respirophasic diameter changes were in the normal range (= 50%);       findings are consistent with normal central venous pressure.     Impressions:            - Normal LV size and systolic function, EF 55%. Moderate LV       hypertrophy. Moderate left atrial enlargement. Normal RV size and       systolic function.         EKG  Procedure date:  07/12/2010  Findings:      typical appearing atrial flutter, V rate  110 bpm, LAD  Impression & Recommendations:  Problem # 1:  ATRIAL FLUTTER (ICD-427.32)  The patient has symptomatic typical appearing atrial flutter.  He is appropriately anticoagulated with pradaxa.  Given his h/o tachy mediated cardiomyopathy, it is important that we try to maintain sinus rhythm longterm.  Therapeutic strategies for afib and atrial flutter including medicine and ablation were discussed in detail with the patient today. Risk, benefits, and alternatives to EP study and radiofrequency ablation for atrial flutter were also discussed in detail today. These risks include but are not limited to stroke, bleeding, vascular damage, tamponade, perforation, damage to the heart and other structures, AV block requiring pacemaker, worsening renal function, and death. The patient understands these risk and wishes to proceed.  WE will therefore proceed with atrial flutter ablation at the next available time.  We will then plan medical management for afib.  His updated medication list for this problem includes:    Carvedilol 25 Mg Tabs (Carvedilol) .[name]... 1 two times a day  Problem # 2:  ATRIAL FIBRILLATION (ICD-427.31)  as above  His updated medication list for this problem includes:    Carvedilol 25 Mg Tabs (Carvedilol) .[name]... 1 two times a day  Problem # 3:  HYPERTENSION (ICD-401.9)  stable  His updated medication list for this  problem includes:    Carvedilol 25 Mg Tabs (Carvedilol) .[name]... 1 two times a day    Diovan 320 Mg Tabs (Valsartan) .[name]... 1 once daily    Furosemide 80 Mg Tabs (Furosemide) .[name]... Take 1/2 tablet by mouth daily.  Problem # 4:  ACUTE CHRONIC COMB SYSTOLIC&DIASTOLIC HEART FAIL (ICD-428.43)  continue current medicine regimen  His updated medication list for this problem includes:    Carvedilol 25 Mg Tabs (Carvedilol) .[name]... 1 two times a day    Diovan 320 Mg Tabs (Valsartan) .[name]... 1 once daily    Furosemide 80 Mg Tabs (Furosemide) .[name]... Take 1/2 tablet by mouth daily.  Other Orders: TLB-BMP (Basic Metabolic Panel-BMET) (80048-METABOL) TLB-CBC Platelet - w/Differential (85025-CBCD)  Patient Instructions: 1)  You have been diagnosed with atrial flutter. Atrial flutter is a condition in which one of the upper chambers of the heart has extra electrical cells causing it to beat very fast. Please see the handout/brochure given to you today for further information.

## 2010-08-01 ENCOUNTER — Encounter: Payer: Self-pay | Admitting: Internal Medicine

## 2010-08-02 ENCOUNTER — Encounter (INDEPENDENT_AMBULATORY_CARE_PROVIDER_SITE_OTHER): Payer: Self-pay | Admitting: *Deleted

## 2010-08-02 ENCOUNTER — Other Ambulatory Visit: Payer: Self-pay | Admitting: Internal Medicine

## 2010-08-02 ENCOUNTER — Other Ambulatory Visit (INDEPENDENT_AMBULATORY_CARE_PROVIDER_SITE_OTHER): Payer: 59

## 2010-08-02 DIAGNOSIS — I4891 Unspecified atrial fibrillation: Secondary | ICD-10-CM

## 2010-08-02 DIAGNOSIS — I4892 Unspecified atrial flutter: Secondary | ICD-10-CM

## 2010-08-02 LAB — BASIC METABOLIC PANEL
Chloride: 103 mEq/L (ref 96–112)
GFR: 59.2 mL/min — ABNORMAL LOW (ref >60.00)
Potassium: 3.4 mEq/L — ABNORMAL LOW (ref 3.5–5.1)

## 2010-08-02 LAB — CBC WITH DIFFERENTIAL/PLATELET
Basophils Relative: 0.8 % (ref 0.0–3.0)
Eosinophils Relative: 2.2 % (ref 0.0–5.0)
HCT: 46.3 % (ref 39.0–52.0)
Lymphs Abs: 1.4 10*3/uL (ref 0.7–4.0)
MCV: 91.4 fl (ref 78.0–100.0)
Monocytes Absolute: 0.6 10*3/uL (ref 0.1–1.0)
Monocytes Relative: 7.7 % (ref 3.0–12.0)
Neutrophils Relative %: 70.5 % (ref 43.0–77.0)
Platelets: 203 10*3/uL (ref 150.0–400.0)
RBC: 5.06 Mil/uL (ref 4.22–5.81)
WBC: 7.3 10*3/uL (ref 4.5–10.5)

## 2010-08-05 ENCOUNTER — Ambulatory Visit (HOSPITAL_COMMUNITY)
Admission: RE | Admit: 2010-08-05 | Discharge: 2010-08-05 | Disposition: A | Discharge status: Home / Self Care | Payer: 59 | Source: Ambulatory Visit | Attending: Internal Medicine | Admitting: Internal Medicine

## 2010-08-05 ENCOUNTER — Encounter: Payer: Self-pay | Admitting: Internal Medicine

## 2010-08-05 DIAGNOSIS — I4891 Unspecified atrial fibrillation: Secondary | ICD-10-CM

## 2010-08-06 ENCOUNTER — Inpatient Hospital Stay (HOSPITAL_COMMUNITY)
Admission: RE | Admit: 2010-08-06 | Discharge: 2010-08-10 | DRG: 250 | Disposition: A | Discharge status: Home / Self Care | Payer: 59 | Source: Ambulatory Visit | Attending: Internal Medicine | Admitting: Internal Medicine

## 2010-08-06 DIAGNOSIS — Z860100 Personal history of colon polyps, unspecified: Secondary | ICD-10-CM

## 2010-08-06 DIAGNOSIS — I517 Cardiomegaly: Secondary | ICD-10-CM | POA: Diagnosis present

## 2010-08-06 DIAGNOSIS — E785 Hyperlipidemia, unspecified: Secondary | ICD-10-CM | POA: Diagnosis present

## 2010-08-06 DIAGNOSIS — I5023 Acute on chronic systolic (congestive) heart failure: Secondary | ICD-10-CM | POA: Diagnosis present

## 2010-08-06 DIAGNOSIS — I4891 Unspecified atrial fibrillation: Secondary | ICD-10-CM | POA: Diagnosis present

## 2010-08-06 DIAGNOSIS — Z23 Encounter for immunization: Secondary | ICD-10-CM

## 2010-08-06 DIAGNOSIS — I10 Essential (primary) hypertension: Secondary | ICD-10-CM | POA: Diagnosis present

## 2010-08-06 DIAGNOSIS — I4892 Unspecified atrial flutter: Principal | ICD-10-CM | POA: Diagnosis present

## 2010-08-06 DIAGNOSIS — K5730 Diverticulosis of large intestine without perforation or abscess without bleeding: Secondary | ICD-10-CM | POA: Diagnosis present

## 2010-08-06 DIAGNOSIS — I428 Other cardiomyopathies: Secondary | ICD-10-CM | POA: Diagnosis present

## 2010-08-06 DIAGNOSIS — J310 Chronic rhinitis: Secondary | ICD-10-CM | POA: Diagnosis present

## 2010-08-06 DIAGNOSIS — G4733 Obstructive sleep apnea (adult) (pediatric): Secondary | ICD-10-CM | POA: Diagnosis present

## 2010-08-06 DIAGNOSIS — I509 Heart failure, unspecified: Secondary | ICD-10-CM | POA: Diagnosis present

## 2010-08-06 DIAGNOSIS — R000 Tachycardia, unspecified: Secondary | ICD-10-CM | POA: Diagnosis present

## 2010-08-06 DIAGNOSIS — E669 Obesity, unspecified: Secondary | ICD-10-CM | POA: Diagnosis present

## 2010-08-06 HISTORY — PX: OTHER SURGICAL HISTORY: SHX169

## 2010-08-06 LAB — BASIC METABOLIC PANEL
CO2: 30 mEq/L (ref 19–32)
Calcium: 9.1 mg/dL (ref 8.4–10.5)
Chloride: 105 mEq/L (ref 96–112)
GFR calc Af Amer: 56 mL/min — ABNORMAL LOW (ref >60)
Glucose, Bld: 121 mg/dL — ABNORMAL HIGH (ref 70–99)
Sodium: 142 mEq/L (ref 135–145)

## 2010-08-07 ENCOUNTER — Inpatient Hospital Stay (HOSPITAL_COMMUNITY): Payer: 59

## 2010-08-07 LAB — MAGNESIUM: Magnesium: 2.9 mg/dL — ABNORMAL HIGH (ref 1.5–2.5)

## 2010-08-07 LAB — BASIC METABOLIC PANEL
Calcium: 9.1 mg/dL (ref 8.4–10.5)
Calcium: 9 mg/dL (ref 8.4–10.5)
Chloride: 105 mEq/L (ref 96–112)
Creatinine, Ser: 1.43 mg/dL (ref 0.4–1.5)
Creatinine, Ser: 1.29 mg/dL (ref 0.4–1.5)
GFR calc Af Amer: >60 mL/min (ref >60)
GFR calc Af Amer: >60 mL/min (ref >60)
GFR calc non Af Amer: 51 mL/min — ABNORMAL LOW (ref >60)
GFR calc non Af Amer: 58 mL/min — ABNORMAL LOW (ref >60)

## 2010-08-07 NOTE — Progress Notes (Signed)
Summary: Ablation Procedure   Phone Note Call from Patient Call back at Home Phone [phone] Call back at Work Phone [phone]   Caller: Patient Reason for Call: Talk to Nurse Summary of Call: Pt calling re rescheduling Ablation Procedure. Initial call taken by: Roe Coombs,  July 23, 2010 8:30 AM  Follow-up for Phone Call        ok for 17th will call tomorrow to reschedule after talking to Dr Johney Frame Dennis Bast, RN, BSN  July 23, 2010 5:59 PMtrying to set up for the 21st   lmom tocall me and let me know if that date is ok Dennis Bast, RN, BSN  July 24, 2010 6:26 PM called and lmom with pt  let him know it would be Mon or Tues before I would know for sure about his ablation and if we can sch on the 21st Dennis Bast, RN, BSN  July 26, 2010 5:11 PM  Additional Follow-up for Phone Call Additional follow up Details #1::        lmom for pt that we have gotten a green light for the 21st  I need him to come in for labs if possible  Please call me tomorrow Dennis Bast, RN, BSN  July 31, 2010 6:01 PM pt calling re abaltion -pls call [phone] Glynda Jaeger  August 01, 2010 3:48 PMpt aware Dennis Bast, RN, BSN  August 01, 2010 4:03 PM

## 2010-08-07 NOTE — Letter (Signed)
Summary: ELectrophysiology/Ablation Procedure Instructions  [facility], Main Office  [address]   [place], [place] [address]   Phone: [phone]  Fax: [phone]     Electrophysiology/Ablation Procedure Instructions    You are scheduled for a(n) afib ablation on 08/06/10 at 7:30am with Dr. Johney Frame.  1.  Please come to the Short Stay Center at [facility] at 5:30am on the day of your procedure.  2.  Come prepared to stay overnight.   Please bring your insurance cards and a list of your medications.  3.  Come to the [place] office on 08/05/10 for lab work.   You do not have to be fasting.  4.  Do not have anything to eat or drink after midnight the night before your procedure.  5.  Do NOT take these medications for the morning of your procedure unless otherwise instructed:  Pradaxa  All of your remaining medications may be taken with a small amount of water.  6.  Educational material received: Ablation   * Occasionally, EP studies and ablations can become lengthy.  Please make your family aware of this before your procedure starts.  Average time ranges from 2-8 hours for EP studies/ablations.  Your physician will locate your family after the procedure with the results.  * If you have any questions after you get home, please call the office at [phone]. Anselm Pancoast

## 2010-08-08 DIAGNOSIS — I10 Essential (primary) hypertension: Secondary | ICD-10-CM

## 2010-08-08 LAB — BASIC METABOLIC PANEL
Calcium: 9 mg/dL (ref 8.4–10.5)
GFR calc Af Amer: >60 mL/min (ref >60)
GFR calc non Af Amer: 57 mL/min — ABNORMAL LOW (ref >60)
Sodium: 138 mEq/L (ref 135–145)

## 2010-08-09 DIAGNOSIS — I5023 Acute on chronic systolic (congestive) heart failure: Secondary | ICD-10-CM

## 2010-08-09 LAB — BASIC METABOLIC PANEL
BUN: 22 mg/dL (ref 6–23)
Calcium: 9 mg/dL (ref 8.4–10.5)
GFR calc non Af Amer: 58 mL/min — ABNORMAL LOW (ref >60)
Potassium: 4 mEq/L (ref 3.5–5.1)
Sodium: 138 mEq/L (ref 135–145)

## 2010-08-10 ENCOUNTER — Inpatient Hospital Stay (HOSPITAL_COMMUNITY): Payer: 59

## 2010-08-10 LAB — BASIC METABOLIC PANEL
Chloride: 106 mEq/L (ref 96–112)
Creatinine, Ser: 1.33 mg/dL (ref 0.4–1.5)
GFR calc Af Amer: >60 mL/min (ref >60)
GFR calc non Af Amer: 56 mL/min — ABNORMAL LOW (ref >60)
Potassium: 4.4 mEq/L (ref 3.5–5.1)

## 2010-08-14 ENCOUNTER — Encounter: Payer: Self-pay | Admitting: Internal Medicine

## 2010-08-14 ENCOUNTER — Telehealth: Payer: Self-pay | Admitting: Internal Medicine

## 2010-08-14 ENCOUNTER — Telehealth (INDEPENDENT_AMBULATORY_CARE_PROVIDER_SITE_OTHER): Payer: Self-pay | Admitting: *Deleted

## 2010-08-15 ENCOUNTER — Telehealth: Payer: Self-pay | Admitting: Internal Medicine

## 2010-08-16 ENCOUNTER — Telehealth (INDEPENDENT_AMBULATORY_CARE_PROVIDER_SITE_OTHER): Payer: Self-pay | Admitting: *Deleted

## 2010-08-22 NOTE — Progress Notes (Signed)
Summary: fmla papers  Medications Added SPIRONOLACTONE 25 MG TABS (SPIRONOLACTONE) Take one tablet by mouth daily TIKOSYN 500 MCG CAPS (DOFETILIDE) Take one capsule by mouth twice a day       Phone Note Call from Patient Call back at Home Phone [phone] Call back at Work Phone [phone]   Caller: Patient Reason for Call: Talk to Nurse Summary of Call: pt has question re fmla papers Initial call taken by: Roe Coombs,  August 14, 2010 11:27 AM  Follow-up for Phone Call        papers sent to [facility] today Dennis Bast, RN, BSN  August 14, 2010 1:22 PM letter done and faxed to pt and Rx's sent to [facility] Dennis Bast, RN, BSN  August 14, 2010 3:04 PM    New/Updated Medications: SPIRONOLACTONE 25 MG TABS (SPIRONOLACTONE) Take one tablet by mouth daily TIKOSYN 500 MCG CAPS (DOFETILIDE) Take one capsule by mouth twice a day Prescriptions: TIKOSYN 500 MCG CAPS (DOFETILIDE) Take one capsule by mouth twice a day  #180 x 3   Entered by:   Dennis Bast, RN, BSN   Authorized by:   Hillis Range, MD   Signed by:   Dennis Bast, RN, BSN on 08/14/2010   Method used:   Faxed to ...       [facility] (mail-order)             , [place]         Ph: [phone]       Fax: [phone]   RxID:   [id number] SPIRONOLACTONE 25 MG TABS (SPIRONOLACTONE) Take one tablet by mouth daily  #90 x 3   Entered by:   Dennis Bast, RN, BSN   Authorized by:   Hillis Range, MD   Signed by:   Dennis Bast, RN, BSN on 08/14/2010   Method used:   Faxed to ...       [facility] (mail-order)             , [place]         Ph: [phone]       Fax: [phone]   RxID:   [id number]

## 2010-08-22 NOTE — Progress Notes (Signed)
   [facility] request received sent to [facility]  August 14, 2010 1:04 PM

## 2010-08-22 NOTE — Progress Notes (Signed)
Summary: pt calling re rtn to work note date-needs call   Phone Note Call from Patient   Caller: Patient [phone] or [phone] Reason for Call: Talk to Nurse Summary of Call: pt calling re rtn to work note-he went back monday and the date on the note was 3-26-pt wants a call Initial call taken by: Glynda Jaeger,  August 15, 2010 8:24 AM  Follow-up for Phone Call        refaxed for 08/12/10 Dennis Bast, RN, BSN  August 15, 2010 9:05 AM

## 2010-08-22 NOTE — Letter (Signed)
Summary: Return To Work  [facility], Main Office  [address]   [place], [place] [address]   Phone: [phone]  Fax: [phone]    08/14/2010  TO: Leodis Sias IT MAY CONCERN   RE: [name] [address] [place]   The above named individual is under my medical care and may return to work on:09/09/10.  If you have any further questions or need additional information, please call [phone].   Sincerely,    Dr. Fayrene Fearing Omarii Scalzo/Kelly Darl Pikes, RN, BSN  Appended Document: Return To Work return 08/12/10

## 2010-08-22 NOTE — Progress Notes (Signed)
----   Converted from flag ---- Flag Received  ---- 08/16/2010 10:38 AM, Bary Leriche wrote: [name], I am sending Dr. Johney [name] a physicians statement for short term dis. for his patient [name] dob. 2055/11/08 The patients insurance co is anxious to have it returned.  Thank you, [name] @ [facility] ------------------------------

## 2010-08-23 NOTE — Discharge Summary (Signed)
NAMESIEGFRIED, VIETH               ACCOUNT NO.:  [id number]  MEDICAL RECORD NO.:  [id number]           PATIENT TYPE:  I  LOCATION:  2035                         FACILITY:  [facility]  PHYSICIAN:  Madolyn Frieze. Jens Som, MD, FACCDATE OF BIRTH:  1956-04-10  DATE OF ADMISSION:  08/06/2010 DATE OF DISCHARGE:  08/10/2010                              DISCHARGE SUMMARY   PRIMARY CARDIOLOGIST:  Hillis Range, MD.  PRIMARY CARE PROVIDER:  Charlaine Dalton. [name], MD, FCCP  DISCHARGE DIAGNOSIS:  Atrial flutter with rapid ventricular response, status post radiofrequency catheter ablation.  SECONDARY DIAGNOSES: 1. Atrial fibrillation, now on Tikosyn therapy. 2. Tachycardia-mediated nonischemic cardiomyopathy. 3. Acute-on-chronic systolic congestive heart failure. 4. Hypertension. 5. Hyperlipidemia. 6. Obesity. 7. Sleep apnea, on CPAP. 8. Diverticulosis. 9. Colonic polyps. 10.Chronic rhinitis.  ALLERGIES:  No known drug allergies.  PROCEDURES:  Successful radiofrequency catheter ablation for atrial flutter performed August 06, 2010.  HISTORY OF PRESENT ILLNESS:  A 55 year old male with history of atrial fibrillation, on chronic Pradaxa therapy.  He was recently seen in clinic by Dr. Johney Frame on January 27 with complaints of dyspnea on exertion.  He was found to be in a typical atrial flutter, which was felt to be amenable to ablation.  This was subsequently arranged and performed as an outpatient.  HOSPITAL COURSE:  The patient presented to [facility] on February 21 after outpatient TEE on February 20 showed no evidence of left atrial appendage thrombus.  EP study was carried out with successful radiofrequency catheter ablation for atrial flutter.  The patient was monitored overnight with no events.  After discussion, decision was made to pursue initiation of Tikosyn therapy for the patient's atrial fibrillation.  His baseline QTC was 436 and his creatinine clearance 90.69, allowed for 500  mcg q.12 h.  The patient has tolerated Tikosyn therapy well, and this morning his QTC is 418 milliseconds.  He remained in sinus rhythm.  Mr. Knebel did have slight volume overload during this admission with chest x-ray on February 22 showing bilateral airspace disease.  He was treated with 2 doses of intravenous Lasix and his weight has been stable at 135.6 kg.  He was started on spironolactone therapy during this admission and will be discharged home today in good condition.  We will arrange for a basic metabolic panel and magnesium in approximately 1 week.  DISCHARGE LABS:  Sodium 139, potassium 4.4, chloride 106, CO2 of 27, BUN 26, creatinine 1.33, glucose 94, calcium 8.8, magnesium 2.9.  DISPOSITION:  The patient will be discharged home today in good condition.  FOLLOWUP APPOINTMENTS:  The patient will have a BMET and magnesium checked in approximately 1 week.  Follow up with Dr. Johney Frame on March 30 at 3:45 p.m.  Follow up with Dr. [name] as previous scheduled.  DISCHARGE MEDICATIONS: 1. Tikosyn 500 mcg b.i.d. 2. Spirolactone 25 mg daily. 3. Afrin 0.05% 2 sprays daily p.r.n. nasal congestion. 4. Androderm patch 4 mg daily. 5. Carvedilol 25 mg b.i.d. 6. Crestor 10 mg nightly. 7. Valsartan 320 mg daily. 8. Lasix 40 mg half tablet b.i.d. and half tab p.r.n. extra swelling. 9. Klor-Con  20 mEq b.i.d. 10.Levothyroxine 150 mcg daily. 11.Melatonin 3 mg nightly. 12.Multivitamin daily. 13.Muscadine Grapeseed over the counter 2 caps daily. 14.Nasonex 1 spray each nostril daily p.r.n. 15.Oxycodone/acetaminophen 5/325 mg q.6 h. p.r.n. 16.Pradaxa 150 mg b.i.d. 17.Symbicort 80/4.5 mcg 2 puffs b.i.d. p.r.n. 18.Vitamin C 1 tab daily. 19.Viagra 100 mg half tablet daily p.r.n. 20.Xopenex 45 mcg 2 puffs q.4 h. p.r.n.  OUTSTANDING LABS AND STUDIES:  Follow up BMET and magnesium in approximately 1 week in our office.  DURATION OF DISCHARGE ENCOUNTER:  45 minutes including physician  time.     Nicolasa Ducking, ANP   ______________________________ Madolyn Frieze. Jens Som, MD, [facility]   CB/MEDQ  D:  08/10/2010  T:  08/10/2010  Job:  [id number]  cc:   Charlaine Dalton. [name], MD, [facility]  Electronically Signed by Nicolasa Ducking ANP on 08/20/2010 04:13:12 PM Electronically Signed by Olga Millers MD [facility] on 08/23/2010 08:34:30 AM

## 2010-08-30 NOTE — Op Note (Signed)
NAMEWAQAS, [name] NO.:  [id number]  MEDICAL RECORD NO.:  [id number]           PATIENT TYPE:  I  LOCATION:  2035                         FACILITY:  [facility]  PHYSICIAN:  Hillis Range, MD       DATE OF BIRTH:  03/25/1956  DATE OF PROCEDURE: DATE OF DISCHARGE:  08/05/2010                              OPERATIVE REPORT   SURGEON:  Hillis Range, MD  PREPROCEDURE DIAGNOSES: 1. Atrial flutter. 2. Persistent atrial fibrillation.  POSTPROCEDURE DIAGNOSES: 1. Isthmus-dependent right atrial flutter. 2. Persistent atrial fibrillation. 3. Severe left atrial enlargement.  PROCEDURES: 1. Comprehensive EP study. 2. Coronary sinus pacing and recording. 3. A 3-D mapping of SVT. 4. Radiofrequency ablation of SVT. 5. Arterial blood pressure monitoring.  INTRODUCTION:  [name] is a very pleasant 55 year old gentleman with a history of persistent atrial fibrillation and atrial flutter who presents today for EP study and radiofrequency ablation.  He has previously been treated with amiodarone for his atrial fibrillation. Recently, amiodarone was discontinued due to hypothyroidism.  The patient has done reasonably well since that time.  He previously has had a tachycardia-mediated cardiomyopathy.  He recently returned to the office complaining of shortness of breath.  He was found to have typical- appearing atrial flutter.  He therefore presents today for EP study and radiofrequency ablation.  DESCRIPTION OF THE PROCEDURE:  Informed written consent was obtained and the patient was brought to the electrophysiology lab in the fasting state.  He was adequately sedated with medications as outlined in the anesthesia report.  The patient's right and left groins were prepped and draped in the usual sterile fashion by the EP lab staff.  Using a percutaneous Seldinger technique, two 7-French and one 8-French hemostasis sheaths were placed in the right common femoral vein.  An  59- [place] hemostasis sheath was placed in the left common femoral vein.  A 4-French hemostasis sheath was placed in the right common femoral artery for blood pressure monitoring.  A 7-French Biosense Sizer decapolar coronary sinus catheter was introduced through the right common femoral vein and advanced into the coronary sinus for recording and pacing from this location.  The patient was noted to have a very large left atrium and is quite difficult to achieve adequate coronary sinus position due to his left atrial enlargement.  A 6-French quadripolar Josephson catheter was introduced through the right common femoral vein and advanced into the right ventricle for recording and pacing.  This catheter was pulled back to the His bundle location.  The patient presented to the electrophysiology lab in atrial flutter.  By surface EKG, the atrial flutter appeared to be typical.  His AH interval measured 52 msec with a QRS duration of 108 msec.  Coronary sinus catheter activation revealed proximal to distal coronary sinus activation suggestive of a right atrial flutter.  The atrial flutter cycle length was 250 msec.  Entrainment was performed from the cavotricuspid isthmus which revealed a post pacing interval equal to the tachycardia cycle length.  I therefore elected to perform mapping and ablation of the cavotricuspid isthmus.  A 3.5-mm Biosense Crow EZ- [employer]  ablation catheter was introduced through the right common femoral vein and advanced into the right atrium.  The patient was noted to have a very large right atrium.  He was also noted to have a rather long cavotricuspid isthmus which was revealed with three- dimensional atrial electroanatomical mapping.  A series of radiofrequency applications were delivered between the tricuspid valve annulus and the inferior vena cava along the usual cavotricuspid isthmus.  Tachycardia slowed and then terminated.   Additional radiofrequency applications were delivered along the cavotricuspid isthmus.  A total of four radiofrequency applications were delivered with a target temperature of 40 degrees at 40 W each.  Following ablation, complete bidirectional cavotricuspid isthmus block was confirmed.  A dual decapolar Halo catheter was introduced through the right common femoral vein and positioned around the tricuspid valve annulus.  Differential atrial pacing was performed from the low lateral right atrium which revealed complete bidirectional cavotricuspid isthmus block.  The patient was observed for 20 minutes without return of conduction through the cavotricuspid isthmus.  The stimulus to earliest atrial activation recorded bidirectionally across the isthmus measured 185 msec.  Following ablation, the AH interval measured 104 msec with an HV interval of 52 msec.  The QRS duration was 108 msec with a PR interval of 162 msec and a QT interval of 457 msec.  The average RR interval was 1097 msec and the corrected QTc interval was therefore 436 msec.  Following ablation, rapid atrial pacing was performed which revealed an AV Wenckebach cycle length of 390 msec.  Atrial pacing was continued down to a cycle length of 250 msec with no arrhythmias observed.  Ventricular pacing was performed which revealed midline concentric decremental VA conduction with retrograde AV Wenckebach cycle length of 400 msec.  Due to the patient's severe left atrial enlargement and significant increase in left atrial pressure observed on transesophageal echocardiogram, I elected to not perform pulmonary vein isolation today.  We will continue to observe the patient and consider further medical therapies for atrial fibrillation as indicated.  The procedure was therefore considered completed.  All catheters were removed, and the sheaths were aspirated and flushed.  The sheaths were removed and hemostasis was assured.  A limited  bedside transthoracic echocardiogram revealed no pericardial effusion.  There were no early apparent complications.  CONCLUSIONS: 1. Typical atrial flutter upon presentation. 2. Successful ablation of atrial flutter along the cavotricuspid     isthmus with complete bidirectional isthmus block achieved. 3. No inducible arrhythmias following ablation. 4. No early apparent complications.     Hillis Range, MD     JA/MEDQ  D:  08/06/2010  T:  08/06/2010  Job:  [id number]  cc:   Charlaine Dalton. [name], MD, [facility]  Electronically Signed by Hillis Range MD on 08/30/2010 10:55:13 PM

## 2010-09-01 LAB — BASIC METABOLIC PANEL
BUN: 28 mg/dL — ABNORMAL HIGH (ref 6–23)
BUN: 31 mg/dL — ABNORMAL HIGH (ref 6–23)
CO2: 31 mEq/L (ref 19–32)
Calcium: 8.8 mg/dL (ref 8.4–10.5)
Calcium: 9 mg/dL (ref 8.4–10.5)
Calcium: 9.1 mg/dL (ref 8.4–10.5)
Calcium: 9.4 mg/dL (ref 8.4–10.5)
Chloride: 100 mEq/L (ref 96–112)
Creatinine, Ser: 1.13 mg/dL (ref 0.4–1.5)
Creatinine, Ser: 1.21 mg/dL (ref 0.4–1.5)
Creatinine, Ser: 1.21 mg/dL (ref 0.4–1.5)
GFR calc Af Amer: >60 mL/min (ref >60)
GFR calc Af Amer: >60 mL/min (ref >60)
GFR calc Af Amer: >60 mL/min (ref >60)
GFR calc non Af Amer: >60 mL/min (ref >60)
GFR calc non Af Amer: >60 mL/min (ref >60)
GFR calc non Af Amer: >60 mL/min (ref >60)
Glucose, Bld: 127 mg/dL — ABNORMAL HIGH (ref 70–99)
Potassium: 3.5 mEq/L (ref 3.5–5.1)
Sodium: 142 mEq/L (ref 135–145)
Sodium: 143 mEq/L (ref 135–145)

## 2010-09-01 LAB — CBC
Hemoglobin: 16.3 g/dL (ref 13.0–17.0)
RBC: 5.43 MIL/uL (ref 4.22–5.81)
WBC: 5.7 10*3/uL (ref 4.0–10.5)

## 2010-09-01 LAB — MAGNESIUM
Magnesium: 1.9 mg/dL (ref 1.5–2.5)
Magnesium: 2.5 mg/dL (ref 1.5–2.5)

## 2010-09-01 LAB — PROTIME-INR
INR: 2.14 — ABNORMAL HIGH (ref 0.00–1.49)
Prothrombin Time: 23.7 seconds — ABNORMAL HIGH (ref 11.6–15.2)

## 2010-09-03 NOTE — Letter (Signed)
Summary: Return to Work  Return to Work   Imported By: Marylou Mccoy 08/26/2010 08:27:17  _____________________________________________________________________  External Attachment:    Type:   Image     Comment:   External Document

## 2010-09-04 ENCOUNTER — Encounter: Payer: Self-pay | Admitting: Internal Medicine

## 2010-09-04 LAB — BASIC METABOLIC PANEL
BUN: 22 mg/dL (ref 6–23)
CO2: 30 mEq/L (ref 19–32)
Calcium: 9 mg/dL (ref 8.4–10.5)
Creatinine, Ser: 1.18 mg/dL (ref 0.4–1.5)
GFR calc non Af Amer: >60 mL/min (ref >60)
Glucose, Bld: 115 mg/dL — ABNORMAL HIGH (ref 70–99)

## 2010-09-12 ENCOUNTER — Ambulatory Visit: Payer: 59 | Admitting: Internal Medicine

## 2010-09-13 ENCOUNTER — Encounter: Payer: Self-pay | Admitting: Adult Health

## 2010-09-13 ENCOUNTER — Encounter: Payer: Self-pay | Admitting: Internal Medicine

## 2010-09-13 ENCOUNTER — Ambulatory Visit: Payer: 59 | Admitting: Internal Medicine

## 2010-09-13 ENCOUNTER — Ambulatory Visit (INDEPENDENT_AMBULATORY_CARE_PROVIDER_SITE_OTHER): Payer: 59 | Admitting: Internal Medicine

## 2010-09-13 VITALS — BP 120/74 | HR 76

## 2010-09-13 DIAGNOSIS — I10 Essential (primary) hypertension: Secondary | ICD-10-CM

## 2010-09-13 DIAGNOSIS — I5043 Acute on chronic combined systolic (congestive) and diastolic (congestive) heart failure: Secondary | ICD-10-CM

## 2010-09-13 DIAGNOSIS — I4891 Unspecified atrial fibrillation: Secondary | ICD-10-CM

## 2010-09-13 DIAGNOSIS — I5022 Chronic systolic (congestive) heart failure: Secondary | ICD-10-CM

## 2010-09-13 DIAGNOSIS — I4892 Unspecified atrial flutter: Secondary | ICD-10-CM

## 2010-09-13 LAB — BASIC METABOLIC PANEL
BUN: 23 mg/dL (ref 6–23)
Calcium: 9.4 mg/dL (ref 8.4–10.5)
Creatinine, Ser: 1.5 mg/dL (ref 0.4–1.5)
GFR: 51.12 mL/min — ABNORMAL LOW (ref >60.00)

## 2010-09-13 NOTE — Patient Instructions (Addendum)
Your physician recommends that you schedule a follow-up appointment in: 3 months with Dr Johney Frame and get and Echo the same day  [phone]  [phone]  [phone]  Call me back with you Furosemide dose Dr Johney Frame only wants you to take 40 mg daily  Your physician recommends that you return for lab work in: [facility]

## 2010-09-14 ENCOUNTER — Encounter: Payer: Self-pay | Admitting: Internal Medicine

## 2010-09-14 NOTE — Assessment & Plan Note (Signed)
CHF has much improved with sinus rhythm We will reassess EF in 3 months Continue current medicines, but decrease lasix to 40mg  daily Check BMET, Mg today

## 2010-09-14 NOTE — Assessment & Plan Note (Signed)
At goal today

## 2010-09-14 NOTE — Progress Notes (Signed)
The patient presents today for routine electrophysiology followup.  He reports doing very well since his atrial flutter ablation.  His SOB and decreased exercise tolerance have improved.  He denies afib.   Today, he denies symptoms of palpitations, chest pain, shortness of breath, orthopnea, PND, lower extremity edema, dizziness, presyncope, syncope, or neurologic sequela.  The patient feels that he is tolerating medications without difficulties and is otherwise without complaint today.   Past Medical History  Diagnosis Date  . Atrial fibrillation     persistant 02/2009  . Atrial flutter     s/p CTI ablation 08/06/10  . Nonischemic cardiomyopathy     tachycardia mediated  . Hyperlipidemia   . Morbid obesity     target weight = 219  for BMI < 30  . Hypertension   . Sleep apnea     original 2001  . Diverticulosis     colonoscopy 04/03/2009  . Chronic rhinitis   . Chronic systolic dysfunction of left ventricle    Past Surgical History  Procedure Date  . Atrial flutter ablation 08/06/10    Current outpatient prescriptions:acetaminophen (TYLENOL) 325 MG tablet, Per bottle , Disp: , Rfl: ;  Ascorbic Acid (VITAMIN C) 500 MG tablet, Take 500 mg by mouth daily.  , Disp: , Rfl: ;  budesonide-formoterol (SYMBICORT) 80-4.5 MCG/ACT inhaler, Inhale 2 puffs into the lungs. 2 puffs first thing in am and 2 puffs again in pm about 12 hours later , Disp: , Rfl:  carvedilol (COREG) 25 MG tablet, Take 25 mg by mouth 2 (two) times daily with a meal.  , Disp: , Rfl: ;  dabigatran (PRADAXA) 150 MG CAPS, Take 150 mg by mouth every 12 (twelve) hours.  , Disp: , Rfl: ;  dofetilide (TIKOSYN) 500 MCG capsule, Take 500 mcg by mouth 2 (two) times daily.  , Disp: , Rfl: ;  furosemide (LASIX) 80 MG tablet, 80 mg. Take 1/2 tablet bid, Disp: , Rfl:  levalbuterol (XOPENEX HFA) 45 MCG/ACT inhaler, Inhale 1-2 puffs into the lungs every 4 (four) hours as needed.  , Disp: , Rfl: ;  levothyroxine (SYNTHROID, LEVOTHROID) 150 MCG  tablet, Take 150 mcg by mouth daily.  , Disp: , Rfl: ;  mometasone (NASONEX) 50 MCG/ACT nasal spray, 2 sprays by Nasal route daily.  , Disp: , Rfl: ;  Multiple Vitamin (MULTIVITAMIN) capsule, Take 1 capsule by mouth daily.  , Disp: , Rfl:  NON FORMULARY, Melatonin 3 mg tabs Take 1 tab by mouth at bedtime as needed , Disp: , Rfl: ;  NON FORMULARY, muscadine Once daily , Disp: , Rfl: ;  oxyCODONE-acetaminophen (TYLOX) 5-500 MG per capsule, Take 1 capsule by mouth every 6 (six) hours as needed.  , Disp: , Rfl: ;  oxymetazoline (AFRIN) 0.05 % nasal spray, 2 sprays by Nasal route 2 (two) times daily.  , Disp: , Rfl:  potassium chloride SA (K-DUR,KLOR-CON) 20 MEQ tablet, Take 20 mEq by mouth 2 (two) times daily.  , Disp: , Rfl: ;  rosuvastatin (CRESTOR) 10 MG tablet, Take 10 mg by mouth daily.  , Disp: , Rfl: ;  sildenafil (VIAGRA) 100 MG tablet, Take 100 mg by mouth as directed.  , Disp: , Rfl: ;  spironolactone (ALDACTONE) 25 MG tablet, Take 25 mg by mouth daily.  , Disp: , Rfl: ;  Testosterone (ANDRODERM) 4 MG/24HR PT24, Place onto the skin.  , Disp: , Rfl:  valsartan (DIOVAN) 320 MG tablet, Take 320 mg by mouth daily.  , Disp: ,  Rfl:   No Known Allergies  History   Social History  . Marital Status: Single    Spouse Name: N/A    Number of Children: N/A  . Years of Education: N/A   Occupational History  . Not on file.   Social History Main Topics  . Smoking status: Never Smoker   . Smokeless tobacco: Not on file  . Alcohol Use: Yes     previously quite heavy, working on cessation   . Drug Use: Not on file  . Sexually Active: Not on file   Other Topics Concern  . Not on file   Social History Narrative   Lives in [place] and works for [employer]    Family History  Problem Relation Age of Onset  . Hypertension Mother    Physical Exam: Filed Vitals:   09/13/10 0916  BP: 120/74  Pulse: 76    GEN- The patient is well appearing, alert and oriented x 3 today.   Head-  normocephalic, atraumatic Eyes-  Sclera clear, conjunctiva pink Ears- hearing intact Oropharynx- clear Neck- supple, no JVP Lymph- no cervical lymphadenopathy Lungs- Clear to ausculation bilaterally, normal work of breathing Heart- Regular rate and rhythm, no murmurs, rubs or gallops, PMI not laterally displaced GI- soft, NT, ND, + BS Extremities- no clubbing, cyanosis, or edema MS- no significant deformity or atrophy Skin- no rash or lesion Psych- euthymic mood, full affect Neuro- strength and sensation are intact  EKG today reveals sinus bradycardia 59 bpm, PR 204, IVCD (QRS 134), Qtc 479

## 2010-09-14 NOTE — Assessment & Plan Note (Signed)
Maintaining sinus rhythm with tikosyn We will check BMET/ Mg today  Continue pradaxa. ETOH cessation is essential.  Given biatrial enlargement, he is not a good candidate for afib ablation at this time.  Hopefully, with medicine therapy, his atria may remodel.  He may eventually be a good afib ablation candidate.

## 2010-09-14 NOTE — Assessment & Plan Note (Signed)
Doing well s/p ablation without procedure related complications

## 2010-09-14 NOTE — Assessment & Plan Note (Signed)
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

## 2010-09-18 LAB — APTT: aPTT: 35 seconds (ref 24–37)

## 2010-09-19 LAB — CBC
MCV: 92.4 fL (ref 78.0–100.0)
RBC: 4.7 MIL/uL (ref 4.22–5.81)
WBC: 5.9 10*3/uL (ref 4.0–10.5)

## 2010-09-19 LAB — PROTIME-INR: Prothrombin Time: 16.9 seconds — ABNORMAL HIGH (ref 11.6–15.2)

## 2010-09-19 LAB — BASIC METABOLIC PANEL
Calcium: 8.7 mg/dL (ref 8.4–10.5)
Chloride: 101 mEq/L (ref 96–112)
Creatinine, Ser: 1.34 mg/dL (ref 0.4–1.5)
GFR calc Af Amer: >60 mL/min (ref >60)
Sodium: 136 mEq/L (ref 135–145)

## 2010-09-19 LAB — HEPARIN LEVEL (UNFRACTIONATED): Heparin Unfractionated: 0.35 IU/mL (ref 0.30–0.70)

## 2010-09-20 LAB — CBC
HCT: 46.9 % (ref 39.0–52.0)
MCHC: 33.9 g/dL (ref 30.0–36.0)
MCHC: 34 g/dL (ref 30.0–36.0)
MCV: 92.5 fL (ref 78.0–100.0)
MCV: 92.9 fL (ref 78.0–100.0)
MCV: 93 fL (ref 78.0–100.0)
Platelets: 154 10*3/uL (ref 150–400)
Platelets: 157 10*3/uL (ref 150–400)
Platelets: 161 10*3/uL (ref 150–400)
Platelets: 164 10*3/uL (ref 150–400)
RDW: 14.4 % (ref 11.5–15.5)
RDW: 14.4 % (ref 11.5–15.5)
RDW: 14.5 % (ref 11.5–15.5)
RDW: 14.6 % (ref 11.5–15.5)
WBC: 6 10*3/uL (ref 4.0–10.5)
WBC: 7.5 10*3/uL (ref 4.0–10.5)

## 2010-09-20 LAB — URINALYSIS, MICROSCOPIC ONLY
Bilirubin Urine: NEGATIVE
Ketones, ur: NEGATIVE mg/dL
Nitrite: NEGATIVE
Protein, ur: NEGATIVE mg/dL
Specific Gravity, Urine: 1.019 (ref 1.005–1.030)
Urobilinogen, UA: 1 mg/dL (ref 0.0–1.0)

## 2010-09-20 LAB — COMPREHENSIVE METABOLIC PANEL
Albumin: 3.6 g/dL (ref 3.5–5.2)
BUN: 24 mg/dL — ABNORMAL HIGH (ref 6–23)
Creatinine, Ser: 1.64 mg/dL — ABNORMAL HIGH (ref 0.4–1.5)
Total Bilirubin: 1.7 mg/dL — ABNORMAL HIGH (ref 0.3–1.2)
Total Protein: 6.4 g/dL (ref 6.0–8.3)

## 2010-09-20 LAB — BRAIN NATRIURETIC PEPTIDE: Pro B Natriuretic peptide (BNP): 853 pg/mL — ABNORMAL HIGH (ref 0.0–100.0)

## 2010-09-20 LAB — CARDIAC PANEL(CRET KIN+CKTOT+MB+TROPI)
Relative Index: INVALID (ref 0.0–2.5)
Total CK: 106 U/L (ref 7–232)
Total CK: 97 U/L (ref 7–232)
Troponin I: 0.08 ng/mL — ABNORMAL HIGH (ref 0.00–0.06)
Troponin I: 0.08 ng/mL — ABNORMAL HIGH (ref 0.00–0.06)
Troponin I: 0.09 ng/mL — ABNORMAL HIGH (ref 0.00–0.06)

## 2010-09-20 LAB — BASIC METABOLIC PANEL
BUN: 17 mg/dL (ref 6–23)
BUN: 21 mg/dL (ref 6–23)
CO2: 27 mEq/L (ref 19–32)
Calcium: 8.7 mg/dL (ref 8.4–10.5)
Chloride: 104 mEq/L (ref 96–112)
Chloride: 104 mEq/L (ref 96–112)
Creatinine, Ser: 1.29 mg/dL (ref 0.4–1.5)
Creatinine, Ser: 1.6 mg/dL — ABNORMAL HIGH (ref 0.4–1.5)
GFR calc Af Amer: 55 mL/min — ABNORMAL LOW (ref >60)
GFR calc non Af Amer: 45 mL/min — ABNORMAL LOW (ref >60)
GFR calc non Af Amer: 50 mL/min — ABNORMAL LOW (ref >60)
GFR calc non Af Amer: 58 mL/min — ABNORMAL LOW (ref >60)
Glucose, Bld: 112 mg/dL — ABNORMAL HIGH (ref 70–99)

## 2010-09-20 LAB — URINE CULTURE: Culture: NO GROWTH

## 2010-09-20 LAB — POCT I-STAT 3, ART BLOOD GAS (G3+)
Bicarbonate: 24.3 mEq/L — ABNORMAL HIGH (ref 20.0–24.0)
TCO2: 25 mmol/L (ref 0–100)

## 2010-09-20 LAB — POCT I-STAT 3, VENOUS BLOOD GAS (G3P V)
O2 Saturation: 50 %
O2 Saturation: 54 %
TCO2: 27 mmol/L (ref 0–100)
TCO2: 27 mmol/L (ref 0–100)
pCO2, Ven: 41.4 mmHg — ABNORMAL LOW (ref 45.0–50.0)
pO2, Ven: 27 mmHg — CL (ref 30.0–45.0)

## 2010-09-20 LAB — HEPARIN LEVEL (UNFRACTIONATED)
Heparin Unfractionated: 0.3 IU/mL (ref 0.30–0.70)
Heparin Unfractionated: 0.37 IU/mL (ref 0.30–0.70)

## 2010-09-20 LAB — PROTIME-INR
INR: 1.3 (ref 0.00–1.49)
Prothrombin Time: 16.3 seconds — ABNORMAL HIGH (ref 11.6–15.2)

## 2010-09-20 LAB — DIFFERENTIAL
Basophils Absolute: 0 10*3/uL (ref 0.0–0.1)
Lymphocytes Relative: 10 % — ABNORMAL LOW (ref 12–46)
Monocytes Absolute: 0.4 10*3/uL (ref 0.1–1.0)
Monocytes Relative: 5 % (ref 3–12)
Neutro Abs: 6.8 10*3/uL (ref 1.7–7.7)

## 2010-09-20 LAB — APTT: aPTT: 25 seconds (ref 24–37)

## 2010-09-25 NOTE — Progress Notes (Signed)
Addended by: Laurance Flatten on: 09/25/2010 09:01 AM   Modules accepted: Orders

## 2010-10-29 NOTE — Assessment & Plan Note (Signed)
[facility]                             PULMONARY OFFICE NOTE   OVERTON, BOGGUS                    MRN:          [id number]  DATE:11/26/2006                            DOB:          [birth year]    HISTORY:  This is a 55 year old white male with morbid obesity  complicated by hypertension, hyperlipidemia, and sleep apnea for which  he has been using 14 cm CPAP since 2002. He says that he sleeps well at  night and denies any morning headache or hypersomnolence. However, he  has not been able to make significant headway in terms of weight  control, albeit he is down from a peak all time weight of 290 to 273  now. He admits that he has not been really good about exercising or  watching his diet. However, he denies any exertional chest pain,  orthopnea, PND, or leg swelling, and says that he has joined the gym and  plans to work out more regularly.   PAST MEDICAL HISTORY:  1. Hypertension.  2. Morbid obesity with target weight of less than 202.  3. Hyperlipidemia, target LDL less than 130 based on male gender and      hypertension.  4. Obstructive sleep apnea diagnosed in 2001, last sleep titration      study done in 2002.  5. Microscopic hematuria in March 2007.   MEDICATIONS:  Taken in detail on the worksheet, corrected in a column  dated 11/26/2006.   SOCIAL HISTORY:  He has never smoked, drinks socially, and he works for  [employer].   FAMILY HISTORY:  Positive for hypertension in his mother. His father is  alive and well, but has poor circulation in his legs. He has one younger  brother who is healthy.   REVIEW OF SYSTEMS:  Taken in detail on the worksheet, negative except as  outlined above.   PHYSICAL EXAMINATION:  GENERAL:  This is a robust, pleasant, ambulatory  white male in no acute distress.  VITAL SIGNS:  Stable. Weight of 273 pounds which is down 3 pounds from a  year ago, blood pressure is 140/90.  HEENT:  Ocular exam was  done with limited funduscopy revealing no  obvious retinal or arterial change. Ear canals clear bilaterally.  NECK:  Supple without cervical adenopathy or tenderness. Trachea is  midline. No thyromegaly.  LUNGS:  Fields are completely bilaterally to auscultation and  percussion.  CARDIAC:  Regular rate and rhythm with no murmurs, rubs, or gallops. No  increase in P2 or S3. Carotid upstrokes are brisk without any bruits.  ABDOMEN:  Obese, soft, benign with no palpable organomegaly, mass, or  tenderness. Femoral pulses were present bilaterally with no bruits.  GENITOURINARY:  Testes descended bilaterally, no nodules.  RECTAL:  Revealed mild BPH, smooth texture, stool guaiac was negative.  EXTREMITIES:  Warm without calf tenderness, cyanosis, clubbing, or  edema. Pedal pulses were strong bilaterally and symmetric.  NEUROLOGIC:  No focal deficits.  SKIN:  Significant for several very benign appearing seborrheic  keratoses over the trunk.  MUSCULOSKELETAL:  Full range of motion in all major  joints.   Chest x-ray showed mild cardiomegaly. EKG showed sinus rhythm with  minimum non-specific STT wave changes laterally.   LABORATORY DATA:  Total cholesterol of 222 with an LDL of 127, and an  HDL of 68. Urinalysis revealed 3-5 RBCs which is up from a year ago. CBC  was normal. Chemistry profile was normal.   IMPRESSION:  1. Hypertension is borderline controlled at present on a complex      medical regimen which I reviewed with the patient. We will need to      continue to observe and have him return every 3 months, and      consider adjusting his medications upward if needed. In the      meantime, the patient needs to make every effort to lose weight.  2. Chronic rhinitis. I reviewed the goals of management of this      problem with nasal steroids with the patient again today.  3. Hyperlipidemia. His LDL is at goal on Crestor at 5 mg once daily.      No change in therapy.  4. Morbid obesity  continues to be the patient's main medical      challenge. I have advised again today regarding the importance of      diet, exercise, and calorie balance, and offered to refer him to a      nutritionist if he will agree to go, with a food diary.  5. Health maintenance: Chart review indicates that he was due for      colonoscopy last year, but was never scheduled. We will try to      schedule again this year. He was updated on tetanus in 2000 and is      due Pneumovax because of his tendency to recurrent      rhinitis/sinusitis, tracheal bronchitis.  6. Microscopic hematuria. This problem was noted last year and is      actually slightly more pronounced this year. A urology workup would      be appropriate here and I will discuss this with him on his next      office visit. Colonoscopy will be done in the meantime.     Charlaine Dalton. [name], MD, [facility]  Electronically Signed    MBW/MedQ  DD: 11/27/2006  DT: 11/28/2006  Job #: [id number]

## 2010-11-01 NOTE — Assessment & Plan Note (Signed)
[facility]                             PULMONARY OFFICE NOTE   NAME:[name], [name]                    MRN:          [id number]  DATE:10/19/2006                            DOB:          1955-10-22    PRIMARY SERVICE/EXTENDED FOLLOWUP OFFICE VISIT   HISTORY:  A 55 year old white male with morbid obesity complicated by  hypertension and hyperlipidemia as well as obstructive sleep apnea who  is having trouble breathing through his nose for the last 10 days with  increasing nasal congestion and slightly discolored nasal mucus.  He  denies any fever or pleuritic pain, sinus pain, or dysphagia or dental  problems.   For full inventory of medications please see face sheet column dated Oct 19, 2006, noting the patient ran out of Nasonex prior to the onset of  allergy season. He has not implemented any of the prn's we'd  previously recommened for sinus c/os'.   PHYSICAL EXAMINATION:  GENERAL:  He is an ambulatory, obese white male  in no acute distress.  VITAL SIGNS:  He is afebrile with stable vital signs, with a blood  pressure of 140/80.  HEENT:  Remarkable for moderately severe turbinate edema, nonspecific  features, with no cyanosis, pallor or polyps.  Oropharynx is clear and  dentition was intact.  NECK:  Supple without cervical adenopathy or tenderness.  Trachea was  midline with no thyromegaly.  LUNG FIELDS:  Perfectly clear bilaterally to auscultation and  percussion.  HEART:  There is a regular rate and rhythm without murmur, gallop, rub.  ABDOMEN:  Soft, benign.  EXTREMITIES:  Warm without calf tenderness, cyanosis, clubbing, or  edema.   IMPRESSION:  1. Poorly-controlled rhinitis, probably with secondary acute      sinusitis.  It was not good timing to run out of the Nasonex right      before the onset of his worst season.  I recommended reinitiation      of nasal steroids in the form of Nasacort and gave a chronic      rhinitis flyer  explaining the appropriate use of Afrin.  To treat      him acutely, I recommended a 10-day course of Augmentin and 6-day      course of prednisone as well.  2. Hypertension is adequately controlled on his present regimen, which      I did not change.  3. Morbid obesity has yet to be addressed in a consistent fashion and      will need to be discussed in the context of a comprehensive      healthcare evaluation which I have asked him to schedule for 6      weeks from now.     Charlaine Dalton. [name], MD, [facility]  Electronically Signed    MBW/MedQ  DD: 10/19/2006  DT: 10/20/2006  Job #: [id number]

## 2010-12-11 ENCOUNTER — Ambulatory Visit: Payer: 59 | Admitting: Internal Medicine

## 2010-12-11 ENCOUNTER — Other Ambulatory Visit (HOSPITAL_COMMUNITY): Payer: 59 | Admitting: Radiology

## 2011-01-08 ENCOUNTER — Ambulatory Visit (INDEPENDENT_AMBULATORY_CARE_PROVIDER_SITE_OTHER): Payer: 59 | Admitting: Internal Medicine

## 2011-01-08 ENCOUNTER — Ambulatory Visit: Payer: 59 | Admitting: Internal Medicine

## 2011-01-08 ENCOUNTER — Other Ambulatory Visit (HOSPITAL_COMMUNITY): Payer: 59 | Admitting: Radiology

## 2011-01-10 ENCOUNTER — Other Ambulatory Visit (HOSPITAL_COMMUNITY): Payer: 59 | Admitting: Radiology

## 2011-01-16 ENCOUNTER — Encounter: Payer: Self-pay | Admitting: Internal Medicine

## 2011-01-16 ENCOUNTER — Ambulatory Visit (INDEPENDENT_AMBULATORY_CARE_PROVIDER_SITE_OTHER): Payer: 59 | Admitting: Internal Medicine

## 2011-01-16 ENCOUNTER — Ambulatory Visit (HOSPITAL_COMMUNITY): Payer: 59 | Attending: Internal Medicine | Admitting: Radiology

## 2011-01-16 DIAGNOSIS — I4892 Unspecified atrial flutter: Secondary | ICD-10-CM | POA: Insufficient documentation

## 2011-01-16 DIAGNOSIS — I509 Heart failure, unspecified: Secondary | ICD-10-CM | POA: Insufficient documentation

## 2011-01-16 DIAGNOSIS — I5022 Chronic systolic (congestive) heart failure: Secondary | ICD-10-CM

## 2011-01-16 DIAGNOSIS — I4891 Unspecified atrial fibrillation: Secondary | ICD-10-CM

## 2011-01-16 DIAGNOSIS — I10 Essential (primary) hypertension: Secondary | ICD-10-CM

## 2011-01-16 DIAGNOSIS — E669 Obesity, unspecified: Secondary | ICD-10-CM | POA: Insufficient documentation

## 2011-01-16 DIAGNOSIS — E785 Hyperlipidemia, unspecified: Secondary | ICD-10-CM | POA: Insufficient documentation

## 2011-01-16 LAB — CBC WITH DIFFERENTIAL/PLATELET
Basophils Absolute: 0.1 10*3/uL (ref 0.0–0.1)
Eosinophils Absolute: 0.3 10*3/uL (ref 0.0–0.7)
Hemoglobin: 13.2 g/dL (ref 13.0–17.0)
Lymphocytes Relative: 25.2 % (ref 12.0–46.0)
Monocytes Relative: 7.5 % (ref 3.0–12.0)
Neutrophils Relative %: 61.1 % (ref 43.0–77.0)
Platelets: 228 10*3/uL (ref 150.0–400.0)
RDW: 13 % (ref 11.5–14.6)

## 2011-01-16 NOTE — Assessment & Plan Note (Signed)
Stable My review of echo today reveals that his EF has improved No chf on exam Decrease lasix to 40mg  daily and follow weight closely Check bmet today

## 2011-01-16 NOTE — Progress Notes (Signed)
The patient presents today for routine electrophysiology followup.  Since last being seen in our clinic, the patient reports doing very well.  He is unaware of any further afib.  His CHF is well controlled.  He has been trying to cut back on ETOH and states that he is presently drinking only on the weekends.  Today, he denies symptoms of palpitations, chest pain, shortness of breath, orthopnea, PND, lower extremity edema, dizziness, presyncope, syncope, or neurologic sequela.  The patient feels that he is tolerating medications without difficulties and is otherwise without complaint today.   Past Medical History  Diagnosis Date  . Atrial fibrillation     persistant 02/2009  . Atrial flutter     s/p CTI ablation 08/06/10  . Nonischemic cardiomyopathy     tachycardia mediated  . Hyperlipidemia   . Morbid obesity     target weight = 219  for BMI < 30  . Hypertension   . Sleep apnea     original 2001  . Diverticulosis     colonoscopy 04/03/2009  . Chronic rhinitis   . Chronic systolic dysfunction of left ventricle    Past Surgical History  Procedure Date  . Atrial flutter ablation 08/06/10    Current Outpatient Prescriptions  Medication Sig Dispense Refill  . acetaminophen (TYLENOL) 325 MG tablet Per bottle       . Ascorbic Acid (VITAMIN C) 500 MG tablet Take 500 mg by mouth daily.        . budesonide-formoterol (SYMBICORT) 80-4.5 MCG/ACT inhaler Inhale 2 puffs into the lungs. 2 puffs first thing in am and 2 puffs again in pm about 12 hours later       . carvedilol (COREG) 25 MG tablet Take 25 mg by mouth 2 (two) times daily with a meal.        . dabigatran (PRADAXA) 150 MG CAPS Take 150 mg by mouth every 12 (twelve) hours.        . dofetilide (TIKOSYN) 500 MCG capsule Take 500 mcg by mouth 2 (two) times daily.        . furosemide (LASIX) 80 MG tablet 80 mg. Take 1/2 tablet bid      . levalbuterol (XOPENEX HFA) 45 MCG/ACT inhaler Inhale 1-2 puffs into the lungs every 4 (four) hours as  needed.        [name] levothyroxine (SYNTHROID, LEVOTHROID) 150 MCG tablet Take 150 mcg by mouth daily.        . mometasone (NASONEX) 50 MCG/ACT nasal spray 2 sprays by Nasal route daily.        . Multiple Vitamin (MULTIVITAMIN) capsule Take 1 capsule by mouth daily.        . NON FORMULARY Melatonin 3 mg tabs Take 1 tab by mouth at bedtime as needed       . NON FORMULARY muscadine Once daily       . oxyCODONE-acetaminophen (TYLOX) 5-500 MG per capsule Take 1 capsule by mouth every 6 (six) hours as needed.        [name] oxymetazoline (AFRIN) 0.05 % nasal spray 2 sprays by Nasal route 2 (two) times daily.        . potassium chloride SA (K-DUR,KLOR-CON) 20 MEQ tablet Take 20 mEq by mouth 2 (two) times daily.        . rosuvastatin (CRESTOR) 10 MG tablet Take 10 mg by mouth daily.        . sildenafil (VIAGRA) 100 MG tablet Take 100 mg by mouth as directed.        [name]  spironolactone (ALDACTONE) 25 MG tablet Take 25 mg by mouth daily.        . Testosterone (ANDRODERM) 4 MG/24HR PT24 Place onto the skin.        . valsartan (DIOVAN) 320 MG tablet Take 320 mg by mouth daily.          No Known Allergies  History   Social History  . Marital Status: Single    Spouse Name: N/A    Number of Children: N/A  . Years of Education: N/A   Occupational History  . Not on file.   Social History Main Topics  . Smoking status: Never Smoker   . Smokeless tobacco: Not on file  . Alcohol Use: Yes     previously quite heavy, working on cessation   . Drug Use: Not on file  . Sexually Active: Not on file   Other Topics Concern  . Not on file   Social History Narrative   Lives in [place] and works for [employer]    Family History  Problem Relation Age of Onset  . Hypertension Mother     ROS-  All systems are reviewed and are negative except as outlined in the HPI above    Physical Exam: Filed Vitals:   01/16/11 1249  BP: 128/88  Pulse: 64    GEN- The patient is well appearing, alert and oriented  x 3 today.   Head- normocephalic, atraumatic Eyes-  Sclera clear, conjunctiva pink Ears- hearing intact Oropharynx- clear Neck- supple, no JVP Lymph- no cervical lymphadenopathy Lungs- Clear to ausculation bilaterally, normal work of breathing Heart- Regular rate and rhythm, no murmurs, rubs or gallops, PMI not laterally displaced GI- soft, NT, ND, + BS Extremities- no clubbing, cyanosis, or edema MS- no significant deformity or atrophy Skin- no rash or lesion Psych- euthymic mood, full affect Neuro- strength and sensation are intact  ekg today reveals sinus rhythm 61 bpm, PR 204, first degree AV block, IVCD, Qtc 485  Assessment and Plan:

## 2011-01-16 NOTE — Assessment & Plan Note (Signed)
Stable No change required today  

## 2011-01-16 NOTE — Assessment & Plan Note (Signed)
-   Weight loss 

## 2011-01-16 NOTE — Patient Instructions (Signed)
Please have lab work today.  Try to decrease your Furosemide to 40 mg a day.  The current medical regimen is effective;  continue present plan and medications.  Weigh daily.  Follow up with Dr Johney Frame in 4 months.

## 2011-01-16 NOTE — Assessment & Plan Note (Addendum)
Maintaining sinus rhythm with tikosyn We will check BMET and Mg today Continue pradaxa for stroke prevention, check CBC today No changes  ETOH cessation again advised

## 2011-01-17 LAB — BASIC METABOLIC PANEL
BUN: 26 mg/dL — ABNORMAL HIGH (ref 6–23)
Calcium: 9.8 mg/dL (ref 8.4–10.5)
Creatinine, Ser: 1.5 mg/dL (ref 0.4–1.5)
GFR: 53.08 mL/min — ABNORMAL LOW (ref >60.00)
Glucose, Bld: 108 mg/dL — ABNORMAL HIGH (ref 70–99)
Sodium: 140 mEq/L (ref 135–145)

## 2011-01-17 LAB — MAGNESIUM: Magnesium: 2.3 mg/dL (ref 1.5–2.5)

## 2011-02-03 NOTE — Progress Notes (Signed)
Addended by: Hillis Range on: 02/03/2011 02:20 PM   Modules accepted: Orders

## 2011-03-19 ENCOUNTER — Encounter: Payer: Self-pay | Admitting: Internal Medicine

## 2011-05-11 ENCOUNTER — Other Ambulatory Visit: Payer: Self-pay | Admitting: Internal Medicine

## 2011-05-12 NOTE — Telephone Encounter (Signed)
.   Requested Prescriptions   Pending Prescriptions Disp Refills  . DIOVAN 320 MG tablet [Pharmacy Med Name: DIOVAN TABS 320MG ] 90 tablet 3    Sig: TAKE 1 TABLET ONCE DAILY  . CRESTOR 10 MG tablet [Pharmacy Med Name: CRESTOR TABS 10MG ] 90 tablet 3    Sig: TAKE 1 TABLET EVERY EVENING   E-scribe to [employer].

## 2011-05-16 ENCOUNTER — Other Ambulatory Visit: Payer: Self-pay | Admitting: Internal Medicine

## 2011-05-16 NOTE — Telephone Encounter (Signed)
..   Requested Prescriptions   Pending Prescriptions Disp Refills  . furosemide (LASIX) 80 MG tablet [Pharmacy Med Name: FUROSEMIDE TABS 80MG ] 180 tablet 2    Sig: TAKE 1 TABLET TWICE A DAY

## 2011-06-03 ENCOUNTER — Ambulatory Visit: Payer: 59 | Admitting: Internal Medicine

## 2011-06-11 ENCOUNTER — Other Ambulatory Visit: Payer: Self-pay | Admitting: Adult Health

## 2011-06-12 ENCOUNTER — Ambulatory Visit: Payer: 59 | Admitting: Internal Medicine

## 2011-06-12 NOTE — Telephone Encounter (Signed)
Electronic refill request for levothyroxine .  Last seen in office by TP 06/2010 - overdue for follow up.  Due to request coming from [facility], #90 authorized with no refills and a note that patient must schedule appt with MW for refills.

## 2011-06-20 ENCOUNTER — Encounter: Payer: Self-pay | Admitting: Gastroenterology

## 2011-06-26 ENCOUNTER — Other Ambulatory Visit: Payer: Self-pay | Admitting: Internal Medicine

## 2011-06-26 MED ORDER — DABIGATRAN ETEXILATE MESYLATE 150 MG PO CAPS: 150.0000 mg | ORAL_CAPSULE | Freq: Two times a day (BID) | ORAL | Status: DC

## 2011-07-17 ENCOUNTER — Other Ambulatory Visit: Payer: Self-pay

## 2011-07-18 MED ORDER — DOFETILIDE 500 MCG PO CAPS: 500.0000 ug | ORAL_CAPSULE | Freq: Two times a day (BID) | ORAL | Status: DC

## 2011-07-23 ENCOUNTER — Other Ambulatory Visit: Payer: Self-pay | Admitting: Internal Medicine

## 2011-07-23 MED ORDER — POTASSIUM CHLORIDE CRYS ER 20 MEQ PO TBCR: 20.0000 meq | EXTENDED_RELEASE_TABLET | Freq: Two times a day (BID) | ORAL | Status: DC

## 2011-07-23 MED ORDER — SPIRONOLACTONE 25 MG PO TABS: 25.0000 mg | ORAL_TABLET | Freq: Every day | ORAL | Status: DC

## 2011-08-10 ENCOUNTER — Other Ambulatory Visit: Payer: Self-pay | Admitting: Adult Health

## 2011-08-14 NOTE — Telephone Encounter (Signed)
Received electronic refill request for levothyroxine from [facility] - pt was last given #90 on 12.26.13 with a note to pharmacy that he must make appt with Dr [name] for refills.  Denial sent to [facility].

## 2011-08-20 ENCOUNTER — Other Ambulatory Visit: Payer: Self-pay | Admitting: *Deleted

## 2011-08-20 MED ORDER — CARVEDILOL 25 MG PO TABS: 25.0000 mg | ORAL_TABLET | Freq: Two times a day (BID) | ORAL | Status: DC

## 2012-01-14 ENCOUNTER — Telehealth: Payer: Self-pay | Admitting: *Deleted

## 2012-01-14 NOTE — Telephone Encounter (Signed)
lmom for pt to return mu=y call.  He needs a follow up appointment and I need to discuss his Furosemide doseage

## 2012-01-27 NOTE — Telephone Encounter (Signed)
Left another message for patient to call me again in regards to his medications and scheduling a follow up appointment

## 2012-02-23 ENCOUNTER — Ambulatory Visit (INDEPENDENT_AMBULATORY_CARE_PROVIDER_SITE_OTHER): Payer: 59 | Admitting: Internal Medicine

## 2012-02-23 ENCOUNTER — Encounter: Payer: Self-pay | Admitting: Internal Medicine

## 2012-02-23 VITALS — BP 145/94 | HR 63 | Ht 73.5 in | Wt 294.4 lb

## 2012-02-23 DIAGNOSIS — I4891 Unspecified atrial fibrillation: Secondary | ICD-10-CM

## 2012-02-23 LAB — CBC WITH DIFFERENTIAL/PLATELET
Basophils Relative: 1 % (ref 0.0–3.0)
Eosinophils Relative: 7.8 % — ABNORMAL HIGH (ref 0.0–5.0)
HCT: 41.5 % (ref 39.0–52.0)
Lymphs Abs: 1.4 10*3/uL (ref 0.7–4.0)
MCV: 90.9 fl (ref 78.0–100.0)
Monocytes Absolute: 0.4 10*3/uL (ref 0.1–1.0)
Monocytes Relative: 6.7 % (ref 3.0–12.0)
RBC: 4.56 Mil/uL (ref 4.22–5.81)
WBC: 6 10*3/uL (ref 4.5–10.5)

## 2012-02-23 LAB — BASIC METABOLIC PANEL
Chloride: 103 mEq/L (ref 96–112)
GFR: 44.96 mL/min — ABNORMAL LOW (ref >60.00)
Potassium: 3.9 mEq/L (ref 3.5–5.1)
Sodium: 140 mEq/L (ref 135–145)

## 2012-02-23 MED ORDER — FUROSEMIDE 80 MG PO TABS: 80.0000 mg | ORAL_TABLET | Freq: Every day | ORAL | Status: DC

## 2012-02-23 MED ORDER — CARVEDILOL 25 MG PO TABS: 25.0000 mg | ORAL_TABLET | Freq: Two times a day (BID) | ORAL | Status: DC

## 2012-02-23 MED ORDER — POTASSIUM CHLORIDE CRYS ER 20 MEQ PO TBCR: 20.0000 meq | EXTENDED_RELEASE_TABLET | Freq: Two times a day (BID) | ORAL | Status: DC

## 2012-02-23 MED ORDER — VALSARTAN 320 MG PO TABS: 320.0000 mg | ORAL_TABLET | Freq: Every day | ORAL | Status: DC

## 2012-02-23 MED ORDER — DOFETILIDE 500 MCG PO CAPS: 500.0000 ug | ORAL_CAPSULE | Freq: Two times a day (BID) | ORAL | Status: DC

## 2012-02-23 MED ORDER — ROSUVASTATIN CALCIUM 10 MG PO TABS: 10.0000 mg | ORAL_TABLET | Freq: Every day | ORAL | Status: DC

## 2012-02-23 MED ORDER — LEVOTHYROXINE SODIUM 150 MCG PO TABS: 150.0000 ug | ORAL_TABLET | Freq: Every day | ORAL | Status: DC

## 2012-02-23 MED ORDER — DABIGATRAN ETEXILATE MESYLATE 150 MG PO CAPS: 150.0000 mg | ORAL_CAPSULE | Freq: Two times a day (BID) | ORAL | Status: DC

## 2012-02-23 NOTE — Patient Instructions (Addendum)
Your physician wants you to follow-up in: 6 months with [name] and Dr Johney Frame in 12 months  You will receive a reminder letter in the mail two months in advance. If you don't receive a letter, please call our office to schedule the follow-up appointment.   Labs today

## 2012-02-23 NOTE — Progress Notes (Signed)
PCP: Sandrea Hughs, MD  The patient presents today for routine cardiology followup.  Since last being seen in our clinic, the patient reports doing very well.  He is unaware of any further afib.  His CHF is well controlled.  He has been somewhat noncompliant with office follow-up but says that he is taking his medicines.  Today, he denies symptoms of palpitations, chest pain, shortness of breath, orthopnea, PND, lower extremity edema, dizziness, presyncope, syncope, or neurologic sequela.  The patient feels that he is tolerating medications without difficulties and is otherwise without complaint today.   Past Medical History  Diagnosis Date  . Atrial fibrillation     persistant 02/2009  . Atrial flutter     s/p CTI ablation 08/06/10  . Nonischemic cardiomyopathy     tachycardia mediated  . Hyperlipidemia   . Morbid obesity     target weight = 219  for BMI < 30  . Hypertension   . Sleep apnea     original 2001  . Diverticulosis     colonoscopy 04/03/2009  . Chronic rhinitis   . Chronic systolic dysfunction of left ventricle    Past Surgical History  Procedure Date  . Atrial flutter ablation 08/06/10    Current Outpatient Prescriptions  Medication Sig Dispense Refill  . budesonide-formoterol (SYMBICORT) 80-4.5 MCG/ACT inhaler Inhale 2 puffs into the lungs. 2 puffs first thing in am and 2 puffs again in pm about 12 hours later       . carvedilol (COREG) 25 MG tablet Take 1 tablet (25 mg total) by mouth 2 (two) times daily with a meal.  180 tablet  2  . CRESTOR 10 MG tablet TAKE 1 TABLET EVERY EVENING  90 tablet  3  . dabigatran (PRADAXA) 150 MG CAPS Take 1 capsule (150 mg total) by mouth every 12 (twelve) hours.  180 capsule  3  . DIOVAN 320 MG tablet TAKE 1 TABLET ONCE DAILY  90 tablet  3  . dofetilide (TIKOSYN) 500 MCG capsule Take 1 capsule (500 mcg total) by mouth 2 (two) times daily.  60 capsule  6  . furosemide (LASIX) 80 MG tablet TAKE 1 TABLET TWICE A DAY  180 tablet  2  .  levalbuterol (XOPENEX HFA) 45 MCG/ACT inhaler Inhale 1-2 puffs into the lungs every 4 (four) hours as needed.        [name] levothyroxine (SYNTHROID, LEVOTHROID) 150 MCG tablet TAKE 1 TABLET DAILY  90 tablet  0  . mometasone (NASONEX) 50 MCG/ACT nasal spray Place 2 sprays into the nose as needed.       . NON FORMULARY muscadine Once daily       . oxymetazoline (AFRIN) 0.05 % nasal spray Place 2 sprays into the nose 2 (two) times daily.        . potassium chloride SA (K-DUR,KLOR-CON) 20 MEQ tablet Take 1 tablet (20 mEq total) by mouth 2 (two) times daily.  180 tablet  3  . sildenafil (VIAGRA) 100 MG tablet Take 100 mg by mouth as directed.        [name] spironolactone (ALDACTONE) 25 MG tablet Take 1 tablet (25 mg total) by mouth daily.  90 tablet  3    No Known Allergies  History   Social History  . Marital Status: Single    Spouse Name: N/A    Number of Children: N/A  . Years of Education: N/A   Occupational History  . Not on file.   Social History Main Topics  .  Smoking status: Never Smoker   . Smokeless tobacco: Not on file  . Alcohol Use: Yes     previously quite heavy, working on cessation   . Drug Use: Not on file  . Sexually Active: Not on file   Other Topics Concern  . Not on file   Social History Narrative   Lives in [place] and works for [employer]    Family History  Problem Relation Age of Onset  . Hypertension Mother      Physical Exam: Filed Vitals:   02/23/12 1353  BP: 145/94  Pulse: 63  Height: 6' 1.5" (1.867 m)  Weight: 294 lb 6.4 oz (133.539 kg)    GEN- The patient is well appearing, alert and oriented x 3 today.   Head- normocephalic, atraumatic Eyes-  Sclera clear, conjunctiva pink Ears- hearing intact Oropharynx- clear Neck- supple, no JVP Lymph- no cervical lymphadenopathy Lungs- Clear to ausculation bilaterally, normal work of breathing Heart- Regular rate and rhythm, no murmurs, rubs or gallops, PMI not laterally displaced GI- soft, NT,  ND, + BS Extremities- no clubbing, cyanosis, or edema MS- no significant deformity or atrophy Skin- no rash or lesion Psych- euthymic mood, full affect Neuro- strength and sensation are intact  ekg today reveals sinus rhythm 62 bpm, PR 178,  IVCD, Qtc 501  Assessment and Plan:

## 2012-02-23 NOTE — Assessment & Plan Note (Signed)
Last echo reveals improved EF with sinus rhythm Consider repeat echo when he returns to follow-up No changes today  Decrease lasix to 40mg  daily Daily weights Avoid ETOH

## 2012-02-23 NOTE — Assessment & Plan Note (Signed)
Maintaining sinus rhythm with tikosyn We will check BMET and Mg today Continue pradaxa for stroke prevention, check CBC today No changes  ETOH cessation and compliance with CPAP again advised

## 2012-02-26 ENCOUNTER — Other Ambulatory Visit: Payer: Self-pay | Admitting: *Deleted

## 2012-02-26 DIAGNOSIS — N289 Disorder of kidney and ureter, unspecified: Secondary | ICD-10-CM

## 2012-03-05 ENCOUNTER — Other Ambulatory Visit (INDEPENDENT_AMBULATORY_CARE_PROVIDER_SITE_OTHER): Payer: 59

## 2012-03-05 DIAGNOSIS — N289 Disorder of kidney and ureter, unspecified: Secondary | ICD-10-CM

## 2012-03-05 LAB — BASIC METABOLIC PANEL
BUN: 19 mg/dL (ref 6–23)
CO2: 24 mEq/L (ref 19–32)
Calcium: 9.1 mg/dL (ref 8.4–10.5)
Chloride: 109 mEq/L (ref 96–112)
Creatinine, Ser: 1.6 mg/dL — ABNORMAL HIGH (ref 0.4–1.5)
Glucose, Bld: 126 mg/dL — ABNORMAL HIGH (ref 70–99)
Sodium: 139 mEq/L (ref 135–145)

## 2012-03-08 ENCOUNTER — Encounter: Payer: Self-pay | Admitting: Gastroenterology

## 2012-06-15 ENCOUNTER — Other Ambulatory Visit: Payer: Self-pay | Admitting: *Deleted

## 2012-06-15 MED ORDER — SPIRONOLACTONE 25 MG PO TABS: 25.0000 mg | ORAL_TABLET | Freq: Every day | ORAL | Status: DC

## 2012-06-18 ENCOUNTER — Telehealth: Payer: Self-pay | Admitting: Internal Medicine

## 2012-06-18 NOTE — Telephone Encounter (Signed)
Pt rtn your call, pls call [phone]

## 2012-06-18 NOTE — Telephone Encounter (Signed)
Spoke with patient and let him know that I have called in the Diovan but I could not okay the Tikosyn as it has been close to two weeks he has not taken the medication.  i let him know that when you miss more than one dose of this medication you must be readmitted to the hospital to restart the medication.[name]  He does not want to do this but I explained to him why it is important.  I told him I would try and get in touch with Dr Johney Frame and call him back before the weekend

## 2012-06-18 NOTE — Telephone Encounter (Signed)
lmom for patient to return my call.  I have requested that [facility] send in his Diovan but can not send the Tikosyn as he has not taken for 2 1/2 weeks.  Will need to discuss with Dr Ladona Ridgel.  He will need to request his Synthroid from Dr [name].  I did talk with patient but left him a message after I spoke with [facility] and found out that not all of his medications are on worry free fill.  He will need to call them and set this up.  His insurance has some of his medications already set up.  He will need to call and put the other ones he wants on worry free so he can get them automatically

## 2012-06-18 NOTE — Telephone Encounter (Signed)
Diovan, synthroid, and tikosyn were all expired and not refilled, does he need to stay on these? Has not taking them since 1st of November, just realized it, will [facility] if to continue  pls call if needed [phone]

## 2012-06-23 NOTE — Telephone Encounter (Signed)
Follow-up:    Patient called in wanting to speak with you about his Tikosyn.  Please call back.  Patient is aware that you were out of the office when this message was taken.

## 2012-06-24 ENCOUNTER — Telehealth: Payer: Self-pay | Admitting: Internal Medicine

## 2012-06-24 NOTE — Telephone Encounter (Signed)
Pt ran out of tikosyn a few days before christmas about 06/06/12, please advise.

## 2012-06-24 NOTE — Telephone Encounter (Signed)
New Problem:    Patient called in wanting to ask [name] about a question she was going to ask Dr. Johney Frame.  Please call back.

## 2012-06-25 NOTE — Telephone Encounter (Signed)
Per Dr. Graciela Husbands, pt has to be readmitted to restart Tikosyn.  Pt states he will call Tresa Endo back on Monday to arrange admission after he looks at his calendar.

## 2012-06-28 NOTE — Telephone Encounter (Signed)
Hillis Range, MD 06/28/2012 8:32 AM Signed  I think that at this point, it may be worth keeping him off of tikosyn and following for recurrence of afib.  Please schedule follow-up with me in 4-6 weeks.   Lorenza Chick [place], [place] 1/61/0960 4:54 PM Signed  Per Dr. Graciela Husbands, pt has to be readmitted to restart Tikosyn. Pt states he will call Tresa Endo back on Monday to arrange admission after he looks at his calendar. Sable Feil, RN 06/24/2012 4:20 PM Signed  Pt ran out of tikosyn a few days before christmas about 06/06/12, please advise. Paulene Floor 06/24/2012 4:00 PM Signed  New Problem:  Patient called in wanting to ask Tresa Endo about a question she was going to ask Dr. Johney Frame. Please call back.

## 2012-06-28 NOTE — Telephone Encounter (Signed)
Apt scheduled for 08/06/12  Patient aware

## 2012-06-28 NOTE — Telephone Encounter (Signed)
I think that at this point, it may be worth keeping him off of tikosyn and following for recurrence of afib. Please schedule follow-up with me in 4-6 weeks.

## 2012-07-22 ENCOUNTER — Telehealth: Payer: Self-pay

## 2012-07-22 NOTE — Telephone Encounter (Signed)
Called patient on preferred number, [phone], left message about his appointment ,08-06-12, and that Dr. Johney Frame would talk with him about his tikosyn refill at that time. [facility] was made aware to not refill Tikosyn at this time.

## 2012-08-06 ENCOUNTER — Encounter: Payer: Self-pay | Admitting: Internal Medicine

## 2012-08-06 ENCOUNTER — Ambulatory Visit (INDEPENDENT_AMBULATORY_CARE_PROVIDER_SITE_OTHER): Payer: BC Managed Care – PPO | Admitting: Internal Medicine

## 2012-08-06 VITALS — BP 116/74 | HR 70 | Wt 301.4 lb

## 2012-08-06 DIAGNOSIS — E7800 Pure hypercholesterolemia, unspecified: Secondary | ICD-10-CM

## 2012-08-06 DIAGNOSIS — G4730 Sleep apnea, unspecified: Secondary | ICD-10-CM

## 2012-08-06 DIAGNOSIS — E785 Hyperlipidemia, unspecified: Secondary | ICD-10-CM

## 2012-08-06 DIAGNOSIS — I4891 Unspecified atrial fibrillation: Secondary | ICD-10-CM

## 2012-08-06 DIAGNOSIS — I5022 Chronic systolic (congestive) heart failure: Secondary | ICD-10-CM

## 2012-08-06 DIAGNOSIS — I10 Essential (primary) hypertension: Secondary | ICD-10-CM

## 2012-08-06 NOTE — Progress Notes (Signed)
PCP: Sandrea Hughs, MD  The patient presents today for routine cardiology followup.  Since last being seen in our clinic, the patient reports doing very well.  He is unaware of any further afib.  His CHF is well controlled.  He stopped taking his tikosyn regularly in December because he "forgot to get his medicine refilled".  He is therefore not presently on AAD therapy.  He reports compliance with ETOH cessation and CPAP.  Today, he denies symptoms of palpitations, chest pain, shortness of breath, orthopnea, PND, lower extremity edema, dizziness, presyncope, syncope, or neurologic sequela.  The patient feels that he is tolerating medications without difficulties and is otherwise without complaint today.   Past Medical History  Diagnosis Date  . Atrial fibrillation     persistant 02/2009  . Atrial flutter     s/p CTI ablation 08/06/10  . Nonischemic cardiomyopathy     tachycardia mediated  . Hyperlipidemia   . Morbid obesity     target weight = 219  for BMI < 30  . Hypertension   . Sleep apnea     original 2001  . Diverticulosis     colonoscopy 04/03/2009  . Chronic rhinitis   . Chronic systolic dysfunction of left ventricle    Past Surgical History  Procedure Laterality Date  . Atrial flutter ablation  08/06/10    Current Outpatient Prescriptions  Medication Sig Dispense Refill  . budesonide-formoterol (SYMBICORT) 80-4.5 MCG/ACT inhaler Inhale 2 puffs into the lungs. 2 puffs first thing in am and 2 puffs again in pm about 12 hours later       . dabigatran (PRADAXA) 150 MG CAPS Take 1 capsule (150 mg total) by mouth every 12 (twelve) hours.  180 capsule  3  . furosemide (LASIX) 80 MG tablet Take 1 tablet (80 mg total) by mouth daily.  90 tablet  3  . levalbuterol (XOPENEX HFA) 45 MCG/ACT inhaler Inhale 1-2 puffs into the lungs every 4 (four) hours as needed.        [name] levothyroxine (SYNTHROID, LEVOTHROID) 150 MCG tablet Take 1 tablet (150 mcg total) by mouth daily.  90 tablet  3  .  mometasone (NASONEX) 50 MCG/ACT nasal spray Place 2 sprays into the nose as needed.       . NON FORMULARY muscadine Once daily       . oxymetazoline (AFRIN) 0.05 % nasal spray Place 2 sprays into the nose 2 (two) times daily.        . potassium chloride SA (K-DUR,KLOR-CON) 20 MEQ tablet Take 1 tablet (20 mEq total) by mouth 2 (two) times daily.  180 tablet  3  . rosuvastatin (CRESTOR) 10 MG tablet Take 1 tablet (10 mg total) by mouth daily.  90 tablet  3  . sildenafil (VIAGRA) 100 MG tablet Take 100 mg by mouth as directed.        [name] spironolactone (ALDACTONE) 25 MG tablet Take 1 tablet (25 mg total) by mouth daily.  90 tablet  3  . valsartan (DIOVAN) 320 MG tablet Take 1 tablet (320 mg total) by mouth daily.  90 tablet  3  . carvedilol (COREG) 25 MG tablet Take 1 tablet (25 mg total) by mouth 2 (two) times daily with a meal.  180 tablet  3  . dofetilide (TIKOSYN) 500 MCG capsule Take 1 capsule (500 mcg total) by mouth 2 (two) times daily.  60 capsule  6   No current facility-administered medications for this visit.    No Known  Allergies  History   Social History  . Marital Status: Single    Spouse Name: N/A    Number of Children: N/A  . Years of Education: N/A   Occupational History  . Not on file.   Social History Main Topics  . Smoking status: Never Smoker   . Smokeless tobacco: Not on file  . Alcohol Use: Yes     Comment: previously quite heavy, working on cessation   . Drug Use: Not on file  . Sexually Active: Not on file   Other Topics Concern  . Not on file   Social History Narrative   Lives in [place] and works for [employer]    Family History  Problem Relation Age of Onset  . Hypertension Mother      Physical Exam: Filed Vitals:   08/06/12 1515  BP: 116/74  Pulse: 70  Weight: 301 lb 6.4 oz (136.714 kg)    GEN- The patient is well appearing, alert and oriented x 3 today.   Head- normocephalic, atraumatic Eyes-  Sclera clear, conjunctiva pink Ears-  hearing intact Oropharynx- clear Neck- supple, no JVP Lymph- no cervical lymphadenopathy Lungs- Clear to ausculation bilaterally, normal work of breathing Heart- Regular rate and rhythm, no murmurs, rubs or gallops, PMI not laterally displaced GI- soft, NT, ND, + BS Extremities- no clubbing, cyanosis, or edema MS- no significant deformity or atrophy Skin- no rash or lesion Psych- euthymic mood, full affect Neuro- strength and sensation are intact  ekg today reveals sinus rhythm 72 bpm, PR 166,  LAHB, Qtc 468  Assessment and Plan: 1. Afib- maintaining sinus rhythm off of Tikosyn If he has recurrent afib, then we will consider ablation vs readmit for tikosyn Compliance with ETOh cessation and cpap is again encouraged Continue anticoagulation long term  2. Chronic systolic dysfunction- repeat echo bmet  3. HL He reports being out of crestor Check Fasting lipids and LFTs  4. Obesity- weight loss advised  5. OSA cpap  6. HTN Stable No change required today

## 2012-08-06 NOTE — Patient Instructions (Signed)
Your physician has requested that you have an echocardiogram. Echocardiography is a painless test that uses sound waves to create images of your heart. It provides your doctor with information about the size and shape of your heart and how well your heart's chambers and valves are working. This procedure takes approximately one hour. There are no restrictions for this procedure.   Your physician wants you to follow-up in: 6 months with Dr Jacquiline Doe will receive a reminder letter in the mail two months in advance. If you don't receive a letter, please call our office to schedule the follow-up appointment.   Your physician recommends that you return for lab work fasting: BMP,Liver,Lipid

## 2012-08-09 NOTE — Addendum Note (Signed)
Addended by: Elisandra Deshmukh, [place] N on: 08/09/2012 07:53 AM   Modules accepted: Orders

## 2012-08-13 ENCOUNTER — Other Ambulatory Visit: Payer: Self-pay

## 2012-08-13 ENCOUNTER — Ambulatory Visit (HOSPITAL_COMMUNITY): Payer: BC Managed Care – PPO | Attending: Internal Medicine | Admitting: Radiology

## 2012-08-13 ENCOUNTER — Other Ambulatory Visit: Payer: Self-pay | Admitting: *Deleted

## 2012-08-13 ENCOUNTER — Other Ambulatory Visit (INDEPENDENT_AMBULATORY_CARE_PROVIDER_SITE_OTHER): Payer: BC Managed Care – PPO

## 2012-08-13 DIAGNOSIS — I10 Essential (primary) hypertension: Secondary | ICD-10-CM | POA: Insufficient documentation

## 2012-08-13 DIAGNOSIS — I428 Other cardiomyopathies: Secondary | ICD-10-CM

## 2012-08-13 DIAGNOSIS — I4891 Unspecified atrial fibrillation: Secondary | ICD-10-CM

## 2012-08-13 DIAGNOSIS — E7800 Pure hypercholesterolemia, unspecified: Secondary | ICD-10-CM

## 2012-08-13 LAB — LIPID PANEL
Cholesterol: 179 mg/dL (ref 0–200)
HDL: 29.8 mg/dL — ABNORMAL LOW (ref >39.00)
Triglycerides: 165 mg/dL — ABNORMAL HIGH (ref 0.0–149.0)

## 2012-08-13 LAB — BASIC METABOLIC PANEL
Calcium: 9 mg/dL (ref 8.4–10.5)
GFR: 28.37 mL/min — ABNORMAL LOW (ref >60.00)
Sodium: 137 mEq/L (ref 135–145)

## 2012-08-13 LAB — HEPATIC FUNCTION PANEL
ALT: 23 U/L (ref 0–53)
AST: 19 U/L (ref 0–37)
Albumin: 3.9 g/dL (ref 3.5–5.2)
Total Protein: 7.1 g/dL (ref 6.0–8.3)

## 2012-08-13 MED ORDER — ROSUVASTATIN CALCIUM 10 MG PO TABS: 10.0000 mg | ORAL_TABLET | Freq: Every day | ORAL | Status: DC

## 2012-08-13 NOTE — Progress Notes (Signed)
Echocardiogram performed.  

## 2012-09-03 ENCOUNTER — Ambulatory Visit (INDEPENDENT_AMBULATORY_CARE_PROVIDER_SITE_OTHER): Payer: BC Managed Care – PPO | Admitting: *Deleted

## 2012-09-03 DIAGNOSIS — I4891 Unspecified atrial fibrillation: Secondary | ICD-10-CM

## 2012-09-03 DIAGNOSIS — E785 Hyperlipidemia, unspecified: Secondary | ICD-10-CM

## 2012-09-03 DIAGNOSIS — I10 Essential (primary) hypertension: Secondary | ICD-10-CM

## 2012-09-03 LAB — LIPID PANEL
Cholesterol: 119 mg/dL (ref 0–200)
Total CHOL/HDL Ratio: 4
Triglycerides: 220 mg/dL — ABNORMAL HIGH (ref 0.0–149.0)
VLDL: 44 mg/dL — ABNORMAL HIGH (ref 0.0–40.0)

## 2012-09-03 LAB — HEPATIC FUNCTION PANEL
AST: 22 U/L (ref 0–37)
Albumin: 3.7 g/dL (ref 3.5–5.2)
Total Protein: 6.8 g/dL (ref 6.0–8.3)

## 2012-09-03 LAB — BASIC METABOLIC PANEL
Chloride: 114 mEq/L — ABNORMAL HIGH (ref 96–112)
GFR: 45.82 mL/min — ABNORMAL LOW (ref >60.00)
Potassium: 4.6 mEq/L (ref 3.5–5.1)

## 2012-09-03 LAB — LDL CHOLESTEROL, DIRECT: Direct LDL: 63.2 mg/dL

## 2012-10-15 ENCOUNTER — Telehealth: Payer: Self-pay | Admitting: Internal Medicine

## 2012-10-15 NOTE — Telephone Encounter (Signed)
Notified patient of lab results 

## 2012-10-15 NOTE — Telephone Encounter (Signed)
New Problem:    Patient called in returning several calls regarding his latest lab results.  Please call back.

## 2012-12-17 ENCOUNTER — Other Ambulatory Visit: Payer: Self-pay | Admitting: Internal Medicine

## 2012-12-20 ENCOUNTER — Other Ambulatory Visit: Payer: Self-pay | Admitting: *Deleted

## 2012-12-20 MED ORDER — FUROSEMIDE 80 MG PO TABS: 80.0000 mg | ORAL_TABLET | Freq: Every day | ORAL | Status: DC

## 2012-12-20 NOTE — Telephone Encounter (Signed)
Lasix refilled today

## 2012-12-20 NOTE — Telephone Encounter (Signed)
Refill sent.

## 2012-12-29 ENCOUNTER — Telehealth: Payer: Self-pay | Admitting: *Deleted

## 2012-12-29 ENCOUNTER — Other Ambulatory Visit: Payer: Self-pay | Admitting: *Deleted

## 2012-12-29 NOTE — Telephone Encounter (Signed)
I called [facility] on Wednsday 12/29/2012. I spoke to [place] in customer service who informed me that the   prescription for [name] ( FUROSEMIDE 80 MG ) 90 TABS w/ 3 refills was shipped to his home on Mon 12/21/2012. The  e-prescribe came   back as an error and did not reach the pharmacy but they have received it. NIna CMA

## 2013-01-10 ENCOUNTER — Other Ambulatory Visit: Payer: Self-pay | Admitting: Internal Medicine

## 2013-02-07 ENCOUNTER — Other Ambulatory Visit: Payer: Self-pay | Admitting: Internal Medicine

## 2013-02-22 ENCOUNTER — Other Ambulatory Visit: Payer: Self-pay | Admitting: Internal Medicine

## 2013-03-02 ENCOUNTER — Other Ambulatory Visit: Payer: Self-pay | Admitting: *Deleted

## 2013-03-02 MED ORDER — DABIGATRAN ETEXILATE MESYLATE 150 MG PO CAPS: 150.0000 mg | ORAL_CAPSULE | Freq: Two times a day (BID) | ORAL | Status: DC

## 2013-07-29 ENCOUNTER — Other Ambulatory Visit: Payer: Self-pay | Admitting: Internal Medicine

## 2013-07-31 ENCOUNTER — Other Ambulatory Visit: Payer: Self-pay | Admitting: Internal Medicine

## 2013-09-06 ENCOUNTER — Other Ambulatory Visit: Payer: Self-pay | Admitting: Internal Medicine

## 2013-10-04 ENCOUNTER — Other Ambulatory Visit: Payer: Self-pay | Admitting: Internal Medicine

## 2013-10-07 ENCOUNTER — Ambulatory Visit (INDEPENDENT_AMBULATORY_CARE_PROVIDER_SITE_OTHER): Payer: BC Managed Care – PPO | Admitting: Internal Medicine

## 2013-10-07 ENCOUNTER — Encounter: Payer: Self-pay | Admitting: Internal Medicine

## 2013-10-07 VITALS — BP 151/96 | HR 67 | Ht 73.5 in | Wt 295.0 lb

## 2013-10-07 DIAGNOSIS — I10 Essential (primary) hypertension: Secondary | ICD-10-CM

## 2013-10-07 DIAGNOSIS — I5022 Chronic systolic (congestive) heart failure: Secondary | ICD-10-CM

## 2013-10-07 DIAGNOSIS — I4891 Unspecified atrial fibrillation: Secondary | ICD-10-CM

## 2013-10-07 DIAGNOSIS — G4730 Sleep apnea, unspecified: Secondary | ICD-10-CM

## 2013-10-07 MED ORDER — SPIRONOLACTONE 25 MG PO TABS: 25.0000 mg | ORAL_TABLET | Freq: Every day | ORAL | Status: DC

## 2013-10-07 NOTE — Patient Instructions (Signed)
Your physician has recommended you make the following change in your medication:  1) Take Spirolactone 25 mg daily  Your physician has requested that you have an echocardiogram. Echocardiography is a painless test that uses sound waves to create images of your heart. It provides your doctor with information about the size and shape of your heart and how well your heart's chambers and valves are working. This procedure takes approximately one hour. There are no restrictions for this procedure.   Your physician recommends that you return for lab work today: TSH, T4, BMET, Liver panel, Lipids  Your physician wants you to follow-up in: 6 months with Dr. Rayann Heman.  You will receive a reminder letter in the mail two months in advance. If you don't receive a letter, please call our office to schedule the follow-up appointment.

## 2013-10-07 NOTE — Progress Notes (Signed)
PCP: Christinia Gully, MD  The patient presents today for routine cardiology followup.  Since last being seen in our clinic, the patient rep He stopped taking his tikosyn regularly in December because he "forgot to get his medicine refilled".  He is therefore not presently on AAD therapy.  He reports compliance with ETOH moderation and CPAP.  Today, he denies symptoms of palpitations, chest pain, shortness of breath, orthopnea, PND, lower extremity edema, dizziness, presyncope, syncope, or neurologic sequela.  The patient feels that he is tolerating medications without difficulties and is otherwise without complaint today.   Past Medical History  Diagnosis Date  . Atrial fibrillation     persistant 02/2009  . Atrial flutter     s/p CTI ablation 08/06/10  . Nonischemic cardiomyopathy     tachycardia mediated  . Hyperlipidemia   . Morbid obesity     target weight = 219  for BMI < 30  . Hypertension   . Sleep apnea     original 2001  . Diverticulosis     colonoscopy 04/03/2009  . Chronic rhinitis   . Chronic systolic dysfunction of left ventricle    Past Surgical History  Procedure Laterality Date  . Atrial flutter ablation  08/06/10    Current Outpatient Prescriptions  Medication Sig Dispense Refill  . budesonide-formoterol (SYMBICORT) 80-4.5 MCG/ACT inhaler Inhale 2 puffs into the lungs. 2 puffs first thing in am and 2 puffs again in pm about 12 hours later       . carvedilol (COREG) 25 MG tablet TAKE 1 TABLET TWICE A DAY WITH A MEAL  180 tablet  0  . CRESTOR 10 MG tablet TAKE 1 TABLET DAILY  90 tablet  2  . furosemide (LASIX) 80 MG tablet Take 1 tablet (80 mg total) by mouth daily.  90 tablet  3  . KLOR-CON M20 20 MEQ tablet TAKE 1 TABLET TWICE A DAY  180 tablet  0  . mometasone (NASONEX) 50 MCG/ACT nasal spray Place 2 sprays into the nose as needed.       . NON FORMULARY muscadine Once daily       . oxymetazoline (AFRIN) 0.05 % nasal spray Place 2 sprays into the nose 2 (two) times  daily.        [name] PRADAXA 150 MG CAPS capsule TAKE 1 CAPSULE EVERY 12 HOURS  180 capsule  0  . sildenafil (VIAGRA) 100 MG tablet Take 100 mg by mouth as directed.        [name] spironolactone (ALDACTONE) 25 MG tablet Take 1 tablet (25 mg total) by mouth daily.  90 tablet  3  . valsartan (DIOVAN) 320 MG tablet TAKE 1 TABLET DAILY  90 tablet  2   No current facility-administered medications for this visit.    No Known Allergies  History   Social History  . Marital Status: Single    Spouse Name: N/A    Number of Children: N/A  . Years of Education: N/A   Occupational History  . Not on file.   Social History Main Topics  . Smoking status: Never Smoker   . Smokeless tobacco: Not on file  . Alcohol Use: Yes     Comment: previously quite heavy, working on cessation   . Drug Use: Not on file  . Sexual Activity: Not on file   Other Topics Concern  . Not on file   Social History Narrative   Lives in [place] and works for [name]    Family History  Problem Relation Age of Onset  . Hypertension Mother      Physical Exam: Filed Vitals:   10/07/13 1409  BP: 151/96  Pulse: 67  Height: 6' 1.5" (1.867 m)  Weight: 295 lb (133.811 kg)    GEN- The patient is well appearing, alert and oriented x 3 today.   Head- normocephalic, atraumatic Eyes-  Sclera clear, conjunctiva pink Ears- hearing intact Oropharynx- clear Neck- supple, no JVP Lymph- no cervical lymphadenopathy Lungs- Clear to ausculation bilaterally, normal work of breathing Heart- Regular rate and rhythm, no murmurs, rubs or gallops, PMI not laterally displaced GI- soft, NT, ND, + BS Extremities- no clubbing, cyanosis, or edema MS- no significant deformity or atrophy Skin- no rash or lesion Psych- euthymic mood, full affect Neuro- strength and sensation are intact  ekg today reveals sinus rhythm 67 bpm, PR 184, RBBB/LAHB (QRS 152), PVCs Echo 2014 reviewed  Assessment and Plan: 1. Afib- maintaining sinus  rhythm off of Tikosyn Continue pradaxa and coreg He has done well off of AAD therapy  2. Chronic systolic dysfunction- repeat echo bmet   3. HL Stable on crestor Check Fasting lipids and LFTs  4. Obesity- weight loss advised  5. OSA cpap  6. HTN Above goal today Will restart spironolactone bmet today  7. Hypothyroidism He has been off of synthroid for quite some time Check TFTs today  Return in 6 months

## 2013-10-10 LAB — BASIC METABOLIC PANEL
BUN: 25 mg/dL — AB (ref 6–23)
CALCIUM: 9.6 mg/dL (ref 8.4–10.5)
CO2: 29 mEq/L (ref 19–32)
Chloride: 105 mEq/L (ref 96–112)
Creatinine, Ser: 1.6 mg/dL — ABNORMAL HIGH (ref 0.4–1.5)
GFR: 48.34 mL/min — AB (ref >60.00)
Glucose, Bld: 96 mg/dL (ref 70–99)
POTASSIUM: 4.1 meq/L (ref 3.5–5.1)
SODIUM: 143 meq/L (ref 135–145)

## 2013-10-10 LAB — HEPATIC FUNCTION PANEL
ALT: 27 U/L (ref 0–53)
AST: 26 U/L (ref 0–37)
Albumin: 4.4 g/dL (ref 3.5–5.2)
Alkaline Phosphatase: 55 U/L (ref 39–117)
BILIRUBIN DIRECT: 0.2 mg/dL (ref 0.0–0.3)
Total Bilirubin: 0.8 mg/dL (ref 0.3–1.2)
Total Protein: 7.6 g/dL (ref 6.0–8.3)

## 2013-10-10 LAB — LIPID PANEL
CHOL/HDL RATIO: 3
Cholesterol: 151 mg/dL (ref 0–200)
HDL: 46 mg/dL (ref >39.00)
LDL Cholesterol: 78 mg/dL (ref 0–99)
Triglycerides: 136 mg/dL (ref 0.0–149.0)
VLDL: 27.2 mg/dL (ref 0.0–40.0)

## 2013-10-10 LAB — T4, FREE: Free T4: 0.75 ng/dL (ref 0.60–1.60)

## 2013-10-10 LAB — TSH: TSH: 3.49 u[IU]/mL (ref 0.35–5.50)

## 2013-10-19 ENCOUNTER — Other Ambulatory Visit: Payer: Self-pay | Admitting: Internal Medicine

## 2013-10-21 ENCOUNTER — Other Ambulatory Visit (HOSPITAL_COMMUNITY): Payer: BC Managed Care – PPO

## 2013-10-27 ENCOUNTER — Other Ambulatory Visit: Payer: Self-pay | Admitting: Internal Medicine

## 2013-10-28 ENCOUNTER — Ambulatory Visit (HOSPITAL_COMMUNITY): Payer: BC Managed Care – PPO | Attending: Internal Medicine | Admitting: Radiology

## 2013-10-28 DIAGNOSIS — I4891 Unspecified atrial fibrillation: Secondary | ICD-10-CM

## 2013-10-28 DIAGNOSIS — I5022 Chronic systolic (congestive) heart failure: Secondary | ICD-10-CM

## 2013-10-28 DIAGNOSIS — I4892 Unspecified atrial flutter: Secondary | ICD-10-CM

## 2013-10-28 DIAGNOSIS — I5043 Acute on chronic combined systolic (congestive) and diastolic (congestive) heart failure: Secondary | ICD-10-CM

## 2013-10-28 MED ORDER — PERFLUTREN PROTEIN A MICROSPH IV SUSP
3.0000 mL | Freq: Once | INTRAVENOUS | Status: AC
  Administered 2013-10-28: 3 mL via INTRAVENOUS

## 2013-10-28 NOTE — Progress Notes (Signed)
Echocardiogram performed with optison.  

## 2013-11-11 ENCOUNTER — Other Ambulatory Visit: Payer: Self-pay | Admitting: Internal Medicine

## 2013-12-05 ENCOUNTER — Other Ambulatory Visit: Payer: Self-pay | Admitting: Internal Medicine

## 2014-01-02 ENCOUNTER — Other Ambulatory Visit: Payer: Self-pay | Admitting: Internal Medicine

## 2014-02-02 ENCOUNTER — Other Ambulatory Visit: Payer: Self-pay | Admitting: Internal Medicine

## 2014-02-09 ENCOUNTER — Other Ambulatory Visit: Payer: Self-pay | Admitting: Internal Medicine

## 2014-03-10 ENCOUNTER — Ambulatory Visit: Payer: BC Managed Care – PPO | Admitting: Internal Medicine

## 2014-03-17 ENCOUNTER — Other Ambulatory Visit: Payer: Self-pay | Admitting: Internal Medicine

## 2014-03-26 ENCOUNTER — Other Ambulatory Visit: Payer: Self-pay | Admitting: Internal Medicine

## 2014-03-28 ENCOUNTER — Other Ambulatory Visit: Payer: Self-pay | Admitting: Internal Medicine

## 2014-04-12 ENCOUNTER — Other Ambulatory Visit: Payer: Self-pay | Admitting: Internal Medicine

## 2014-05-03 ENCOUNTER — Other Ambulatory Visit: Payer: Self-pay | Admitting: Internal Medicine

## 2014-05-08 ENCOUNTER — Ambulatory Visit (INDEPENDENT_AMBULATORY_CARE_PROVIDER_SITE_OTHER): Payer: BC Managed Care – PPO | Admitting: Internal Medicine

## 2014-05-08 ENCOUNTER — Encounter: Payer: Self-pay | Admitting: Internal Medicine

## 2014-05-08 ENCOUNTER — Ambulatory Visit (INDEPENDENT_AMBULATORY_CARE_PROVIDER_SITE_OTHER): Payer: BC Managed Care – PPO | Admitting: Geriatric Medicine

## 2014-05-08 ENCOUNTER — Other Ambulatory Visit (INDEPENDENT_AMBULATORY_CARE_PROVIDER_SITE_OTHER): Payer: BC Managed Care – PPO

## 2014-05-08 VITALS — BP 136/78 | HR 61 | Temp 98.4°F | Resp 20 | Ht 73.0 in | Wt 304.0 lb

## 2014-05-08 DIAGNOSIS — I10 Essential (primary) hypertension: Secondary | ICD-10-CM

## 2014-05-08 DIAGNOSIS — E039 Hypothyroidism, unspecified: Secondary | ICD-10-CM

## 2014-05-08 DIAGNOSIS — I480 Paroxysmal atrial fibrillation: Secondary | ICD-10-CM

## 2014-05-08 DIAGNOSIS — I5022 Chronic systolic (congestive) heart failure: Secondary | ICD-10-CM

## 2014-05-08 DIAGNOSIS — Z0000 Encounter for general adult medical examination without abnormal findings: Secondary | ICD-10-CM

## 2014-05-08 DIAGNOSIS — G4730 Sleep apnea, unspecified: Secondary | ICD-10-CM

## 2014-05-08 DIAGNOSIS — Z23 Encounter for immunization: Secondary | ICD-10-CM

## 2014-05-08 DIAGNOSIS — J4520 Mild intermittent asthma, uncomplicated: Secondary | ICD-10-CM

## 2014-05-08 DIAGNOSIS — Z860100 Personal history of colon polyps, unspecified: Secondary | ICD-10-CM

## 2014-05-08 LAB — BASIC METABOLIC PANEL
BUN: 32 mg/dL — ABNORMAL HIGH (ref 6–23)
CALCIUM: 9.4 mg/dL (ref 8.4–10.5)
CO2: 27 mEq/L (ref 19–32)
Chloride: 104 mEq/L (ref 96–112)
Creatinine, Ser: 1.6 mg/dL — ABNORMAL HIGH (ref 0.4–1.5)
GFR: 47.2 mL/min — ABNORMAL LOW (ref >60.00)
GLUCOSE: 173 mg/dL — AB (ref 70–99)
POTASSIUM: 5 meq/L (ref 3.5–5.1)
SODIUM: 137 meq/L (ref 135–145)

## 2014-05-08 MED ORDER — SILDENAFIL CITRATE 100 MG PO TABS: 100.0000 mg | ORAL_TABLET | ORAL | Status: DC

## 2014-05-08 NOTE — Assessment & Plan Note (Signed)
Patient currently taking Symbicort not daily but several times weekly. He uses his Xopenex inhaler and emergencies however has not used it in some time.

## 2014-05-08 NOTE — Assessment & Plan Note (Signed)
Last echo reported normal ejection fraction. Likely has decreased ejection fraction while in a fib.

## 2014-05-08 NOTE — Assessment & Plan Note (Signed)
Colonoscopy last in 2010, recommended repeat in 2 years due to sessile polyp. Will refer patient to GI for screening colonoscopy. He is up-to-date on tetanus shot. Given flu shot today. Spoke with him about weight loss and exercise. He is a nonsmoker.

## 2014-05-08 NOTE — Assessment & Plan Note (Signed)
Patient currently on rate control therapy with coreg. He is in sinus rhythm at today's visit. He is anticoagulated with pradaxa.

## 2014-05-08 NOTE — Assessment & Plan Note (Signed)
Patient currently on Crestor, last lipid panel from April was at goal.

## 2014-05-08 NOTE — Progress Notes (Signed)
   Subjective:    Patient ID: [name], male    DOB: 1955/11/24, 58 y.o.   MRN: [id number]  HPI The patient is a 58 year old man who comes in today to establish care. He does have past medical history of hypothyroidism, OSA, morbid obesity, atrial fibrillation, asthma. He is doing very well with his breathing and never uses his Xopenex inhaler. He uses to see Pap nightly. He denies any new complaints. He is having some joint pain in his knees however attributes that to old football injuries. He knows that he should lose some weight however is not sure how to start. He rates his diet is poor and does not exercise. He wishes to ask if he needs a shingles shot yet because his friend recently had shingles and he noted that it was a very unpleasant experience. He denies chest pains, palpitations, shortness of breath, abdominal pain, GERD, constipation, diarrhea. He notes that he is probably overdue for colonoscopy.  Review of Systems  Constitutional: Negative for fever, activity change, appetite change, fatigue and unexpected weight change.  HENT: Negative.   Eyes: Negative.   Respiratory: Negative for cough, chest tightness, shortness of breath and wheezing.   Cardiovascular: Negative for chest pain, palpitations and leg swelling.  Gastrointestinal: Negative for nausea, abdominal pain, diarrhea, constipation and abdominal distention.  Genitourinary: Negative.   Musculoskeletal: Positive for arthralgias. Negative for myalgias, back pain and gait problem.  Skin: Negative.   Neurological: Negative.   Psychiatric/Behavioral: Negative.       Objective:   Physical Exam  Constitutional: He is oriented to person, place, and time. He appears well-developed and well-nourished.  obese  HENT:  Head: Normocephalic and atraumatic.  Eyes: EOM are normal.  Neck: Normal range of motion.  Cardiovascular: Normal rate, regular rhythm and intact distal pulses.   No murmur heard. Pulmonary/Chest: Effort  normal and breath sounds normal. No respiratory distress. He has no wheezes. He has no rales.  Abdominal: Soft. Bowel sounds are normal. He exhibits no distension. There is no tenderness. There is no rebound.  Musculoskeletal: He exhibits no edema.  Neurological: He is alert and oriented to person, place, and time. Coordination normal.  Skin: Skin is warm and dry.   Filed Vitals:   05/08/14 0819  BP: 136/78  Pulse: 61  Temp: 98.4 F (36.9 C)  TempSrc: Oral  Resp: 20  Height: 6\' 1"  (1.854 m)  Weight: 304 lb (137.893 kg)  SpO2: 96%      Assessment & Plan:

## 2014-05-08 NOTE — Assessment & Plan Note (Signed)
Patient's blood pressure well controlled today. He is on fairly extensive regimen of Coreg, spironolactone, losartan. He has not had repeat basic metabolic panel since restarting [place] lactone in April. We'll check that today. May need to adjust regimen.

## 2014-05-08 NOTE — Patient Instructions (Signed)
We will check your blood work today to make sure that the spironolactone has not affected your kidneys are electrolytes. We will call you back with those results.  We definitely think they need to work on losing a little bit of weight for the health of your heart as well as your joints. We will send you to any nutrition expert who can help you put together a good diet plan for you.  We had given you the flu shot and we will give you the shingles shot when you come back next year. We are also going to send her referral back to the stomach doctor so that you can get your colonoscopy.  Exercise to Lose Weight Exercise and a healthy diet may help you lose weight. Your doctor may suggest specific exercises. EXERCISE IDEAS AND TIPS  Choose low-cost things you enjoy doing, such as walking, bicycling, or exercising to workout videos.  Take stairs instead of the elevator.  Walk during your lunch break.  Park your car further away from work or school.  Go to a gym or an exercise class.  Start with 5 to 10 minutes of exercise each day. Build up to 30 minutes of exercise 4 to 6 days a week.  Wear shoes with good support and comfortable clothes.  Stretch before and after working out.  Work out until you breathe harder and your heart beats faster.  Drink extra water when you exercise.  Do not do so much that you hurt yourself, feel dizzy, or get very short of breath. Exercises that burn about 150 calories:  Running 1  miles in 15 minutes.  Playing volleyball for 45 to 60 minutes.  Washing and waxing a car for 45 to 60 minutes.  Playing touch football for 45 minutes.  Walking 1  miles in 35 minutes.  Pushing a stroller 1  miles in 30 minutes.  Playing basketball for 30 minutes.  Raking leaves for 30 minutes.  Bicycling 5 miles in 30 minutes.  Walking 2 miles in 30 minutes.  Dancing for 30 minutes.  Shoveling snow for 15 minutes.  Swimming laps for 20 minutes.  Walking  up stairs for 15 minutes.  Bicycling 4 miles in 15 minutes.  Gardening for 30 to 45 minutes.  Jumping rope for 15 minutes.  Washing windows or floors for 45 to 60 minutes. Document Released: 07/05/2010 Document Revised: 08/25/2011 Document Reviewed: 07/05/2010 [facility] Patient Information 2015 [place], [place]. This information is not intended to replace advice given to you by your health care provider. Make sure you discuss any questions you have with your health care provider.

## 2014-05-08 NOTE — Assessment & Plan Note (Signed)
Patient rates his diet is poor and does not exercise. Spoke with him about the need to lose weight. He'll be referred to a nutritionist to help him establish a good diet plan for him.

## 2014-05-08 NOTE — Assessment & Plan Note (Signed)
Uses C Pap nightly.

## 2014-05-08 NOTE — Progress Notes (Signed)
Pre visit review using our clinic review tool, if applicable. No additional management support is needed unless otherwise documented below in the visit note. 

## 2014-05-08 NOTE — Assessment & Plan Note (Signed)
Per last documented note from cardiology patient was off his thyroid medication for months prior to her last thyroid function tests in April. However he states he is not sure if he was taking it at that time or not. We'll recheck thyroid function tests at next visit.

## 2014-05-09 ENCOUNTER — Encounter: Payer: Self-pay | Admitting: Gastroenterology

## 2014-05-17 ENCOUNTER — Ambulatory Visit (INDEPENDENT_AMBULATORY_CARE_PROVIDER_SITE_OTHER): Payer: BC Managed Care – PPO | Admitting: Gastroenterology

## 2014-05-17 ENCOUNTER — Telehealth: Payer: Self-pay | Admitting: *Deleted

## 2014-05-17 ENCOUNTER — Encounter: Payer: Self-pay | Admitting: Gastroenterology

## 2014-05-17 ENCOUNTER — Other Ambulatory Visit: Payer: Self-pay | Admitting: *Deleted

## 2014-05-17 VITALS — BP 124/66 | HR 72 | Ht 71.5 in | Wt 301.0 lb

## 2014-05-17 DIAGNOSIS — Z7901 Long term (current) use of anticoagulants: Secondary | ICD-10-CM

## 2014-05-17 DIAGNOSIS — Z86010 Personal history of colonic polyps: Secondary | ICD-10-CM

## 2014-05-17 MED ORDER — MOVIPREP 100 G PO SOLR: 1.0000 | Freq: Once | ORAL | Status: DC

## 2014-05-17 NOTE — Telephone Encounter (Signed)
Patient was seen today  Patient had to go home and talk to his wife about dates for the procedure Patient called back scheduled for 07-25-2014 Patient will come Friday afternoon to sign paperwork and go over prep

## 2014-05-17 NOTE — Patient Instructions (Signed)
It has been recommended to you by your physician that you have a(n)Colonoscopy  completed. Per your request, we did not schedule the procedure(s) today. Please contact our office at [phone] should you decide to have the procedure completed. 

## 2014-05-17 NOTE — Progress Notes (Signed)
Agree with initial assessment and plans. Appropriate candidate for surveillance colonoscopy. Cardiology to assist with the management of perioperative anticoagulation

## 2014-05-17 NOTE — Telephone Encounter (Signed)
05/17/2014   RE: [name] DOB: 1955-12-07 MRN: [id number]   Dear Dr. Rayann Heman,    We have scheduled the above patient for an Colonoscopy. Our records show that he is on anticoagulation therapy.   Please advise as to how long the patient may come off his therapy of Pradaxa prior to the procedure, which is scheduled for 07-25-2014.  Please route the completed form to Evette Georges., CMA   Sincerely,    Hope Pigeon

## 2014-05-17 NOTE — Progress Notes (Signed)
05/17/2014 [name] [id number] 05/04/1956   HISTORY OF PRESENT ILLNESS:  This is a 58 year old male who is previously known to Dr. Sharlett Iles.  He had a colonoscopy in 03/2008 at which time he was found to have diverticulosis and colon polyps that were tubular adenomas.  He is here today to discuss repeat colonoscopy.  He is on Pradaxa for atrial fibrillation/flutter.  Had an ablation in 2012 and has been doing well since then.  Follows with Dr. Rayann Heman yearly.  Denies any GI complaints.   Past Medical History  Diagnosis Date  . Atrial fibrillation     persistant 02/2009  . Atrial flutter     s/p CTI ablation 08/06/10  . Nonischemic cardiomyopathy     tachycardia mediated  . Hyperlipidemia   . Morbid obesity     target weight = 219  for BMI < 30  . Hypertension   . Sleep apnea     original 2001  . Diverticulosis     colonoscopy 04/03/2009  . Chronic rhinitis   . Chronic systolic dysfunction of left ventricle   . Cardiac arrhythmia due to congenital heart disease   . Colon polyp   . Congestive heart failure   . Hypertension    Past Surgical History  Procedure Laterality Date  . Atrial flutter ablation  08/06/10    reports that he has never smoked. He has never used smokeless tobacco. He reports that he drinks alcohol. He reports that he does not use illicit drugs. family history includes Colon cancer (age of onset: 69) in his father; Hypertension in his mother. There is no history of Colon polyps, Diabetes, Kidney disease, or Esophageal cancer. No Known Allergies    Outpatient Encounter Prescriptions as of 05/17/2014  Medication Sig  . budesonide-formoterol (SYMBICORT) 80-4.5 MCG/ACT inhaler Inhale 2 puffs into the lungs. 2 puffs first thing in am and 2 puffs again in pm about 12 hours later   . carvedilol (COREG) 25 MG tablet TAKE 1 TABLET TWICE A DAY WITH MEALS  . CRESTOR 10 MG tablet TAKE 1 TABLET DAILY  . furosemide (LASIX) 80 MG tablet TAKE 1 TABLET DAILY  .  levothyroxine (SYNTHROID, LEVOTHROID) 150 MCG tablet Take 150 mcg by mouth daily before breakfast.  . mometasone (NASONEX) 50 MCG/ACT nasal spray Place 2 sprays into the nose as needed.   . NON FORMULARY muscadine Once daily   . potassium chloride SA (K-DUR,KLOR-CON) 20 MEQ tablet TAKE 1 TABLET TWICE A DAY .[name] KitchenPatient needs to contact office to schedule  Appointment  for future refills.Ph:[phone]. Thank you.  [name] PRADAXA 150 MG CAPS capsule TAKE 1 CAPSULE EVERY 12 HOURS (NEED APPOINTMENT)  . sildenafil (VIAGRA) 100 MG tablet Take 1 tablet (100 mg total) by mouth as directed.  [name] spironolactone (ALDACTONE) 25 MG tablet Take 1 tablet (25 mg total) by mouth daily.  . valsartan (DIOVAN) 320 MG tablet TAKE 1 TABLET DAILY     REVIEW OF SYSTEMS  : All other systems reviewed and negative except where noted in the History of Present Illness.   PHYSICAL EXAM: BP 124/66 mmHg  Pulse 72  Ht 5' 11.5" (1.816 m)  Wt 301 lb (136.533 kg)  BMI 41.40 kg/m2 General: Well developed white male in no acute distress Head: Normocephalic and atraumatic Eyes:  Sclerae anicteric, conjunctiva pink. Ears: Normal auditory acuity Lungs: Clear throughout to auscultation Heart: Regular rate and rhythm Abdomen: Soft, non-distended.  Normal bowel sounds.  Non-tender. Rectal:  Will be  done at the time of colonoscopy. Musculoskeletal: Symmetrical with no gross deformities  Skin: No lesions on visible extremities Extremities: No edema  Neurological: Alert oriented x 4, grossly non-focal Psychological:  Alert and cooperative. Normal mood and affect  ASSESSMENT AND PLAN: -Personal history of colon polyps:  History of tubular adenoma in 03/2008.  Will schedule with Dr. Henrene Pastor.  The risks, benefits, and alternatives were discussed with the patient and he consents to proceed.  The risks benefits and alternatives to a temporary hold of anti-coagulants/anti-platelets for the procedure were discussed with the patient he consents  to proceed. Obtain clearance from Dr. Rayann Heman for ok to hold Pradaxa for 2 days prior to procedure.

## 2014-05-18 ENCOUNTER — Telehealth: Payer: Self-pay | Admitting: *Deleted

## 2014-05-18 NOTE — Telephone Encounter (Signed)
Patient will be notified when he comes to sign paperwork

## 2014-05-18 NOTE — Telephone Encounter (Signed)
PATIENT OF DR. ALLREDS.  SENT TO ME IN ERROR.  ROUTED TO DR. ALLRED.

## 2014-05-18 NOTE — Telephone Encounter (Signed)
Hold pradaxa 48 hours prior and resume after procedure.

## 2014-05-18 NOTE — Telephone Encounter (Signed)
05/17/2014   RE: [name] DOB: 1956-03-14 MRN: [id number]   Dear Dr. Rayann Heman,    We have scheduled the above patient for an Colonoscopy. Our records show that he is on anticoagulation therapy.   Please advise as to how long the patient may come off his therapy of Pradaxa prior to the procedure, which is scheduled for 07-25-2014.  Please route the completed form to Evette Georges., CMA   Sincerely,    Hope Pigeon

## 2014-05-22 ENCOUNTER — Encounter: Payer: Self-pay | Admitting: Internal Medicine

## 2014-05-27 ENCOUNTER — Other Ambulatory Visit: Payer: Self-pay | Admitting: Internal Medicine

## 2014-06-03 ENCOUNTER — Other Ambulatory Visit: Payer: Self-pay | Admitting: Internal Medicine

## 2014-06-15 ENCOUNTER — Other Ambulatory Visit: Payer: Self-pay | Admitting: Internal Medicine

## 2014-06-29 ENCOUNTER — Encounter: Payer: Self-pay | Admitting: Dietician

## 2014-06-29 ENCOUNTER — Encounter: Payer: BLUE CROSS/BLUE SHIELD | Attending: Internal Medicine | Admitting: Dietician

## 2014-06-29 DIAGNOSIS — Z6839 Body mass index (BMI) 39.0-39.9, adult: Secondary | ICD-10-CM | POA: Diagnosis not present

## 2014-06-29 DIAGNOSIS — Z713 Dietary counseling and surveillance: Secondary | ICD-10-CM | POA: Insufficient documentation

## 2014-06-29 NOTE — Progress Notes (Signed)
  Medical Nutrition Therapy:  Appt start time: 1430 end time:  8264.   Assessment:  Primary concerns today: Mr. Lacher is here today to discuss weight loss. 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 Has recently lost 3-4 pounds since avoiding snacks in the evenings.  He states that 245 lbs is a normal weight for him. He identifies that his lack of exercise has contributed to weight gain.  Preferred Learning Style:   No preference indicated   Learning Readiness:   Ready   MEDICATIONS: see list   DIETARY INTAKE:  Usual eating pattern includes 3 meals and 1 snack per day. Avoided foods include tomatoes, Brussel's sprouts, cauliflower.    24-hr recall:  B ( AM): 2 McDonald's Egg McMuffins without the bun  Snk ( AM): banana  L ( PM): clementines and apple OR Wendy's chili OR grilled chicken OR salad Snk ( PM): none D ( PM): grilled chicken OR chili OR tacos OR kielbasa with green beans and corn OR steak (large portions) Snk ( PM): sugar free jello  Beverages: 2-4 beers every day, water, coffee with Splenda and non dairy creamer   Usual physical activity: none; considering joining the gym close to his office  Estimated energy needs: 2000 calories  Progress Towards Goal(s):  In progress.   Nutritional Diagnosis:  [place]-3.3 Overweight/obesity As related to history of excessive energy intake and lack of physical activity.  As evidenced by BMI of 39.5.    Intervention:  Nutrition counseling provided.  Teaching Method Utilized:  Visual Auditory  Handouts given during visit include:  Sample Meal plan  MyPlate  Snack sheet  Barriers to learning/adherence to lifestyle change: none  Demonstrated degree of understanding via:  Teach Back   Monitoring/Evaluation:  Dietary intake, exercise, and body weight prn.

## 2014-06-29 NOTE — Patient Instructions (Signed)
Goal weight: 245 lbs  -Add a protein food to lunch: boiled egg, low fat cheese, nuts, Greek yogurt, Premier protein shake or EAS AdvantEdge -Choose carbohydrates high in fiber and low in sugar  Increase exercise:  -Start walking 3-4 days a week -Increase amount as tolerated   Meals: (Plate Method) Unlimited non starchy vegetables 3-4 oz lean protein  1/2 cup starch   Snacks: carb + protein    -Try [employer] Protein bar or Balanced Breaks by [employer]

## 2014-07-08 ENCOUNTER — Other Ambulatory Visit: Payer: Self-pay | Admitting: Internal Medicine

## 2014-07-25 ENCOUNTER — Encounter: Payer: Self-pay | Admitting: Internal Medicine

## 2014-07-25 ENCOUNTER — Ambulatory Visit (AMBULATORY_SURGERY_CENTER): Payer: BLUE CROSS/BLUE SHIELD | Admitting: Internal Medicine

## 2014-07-25 VITALS — BP 110/59 | HR 69 | Temp 97.6°F | Resp 19 | Ht 71.5 in | Wt 301.0 lb

## 2014-07-25 DIAGNOSIS — D120 Benign neoplasm of cecum: Secondary | ICD-10-CM

## 2014-07-25 DIAGNOSIS — D124 Benign neoplasm of descending colon: Secondary | ICD-10-CM

## 2014-07-25 DIAGNOSIS — Z86010 Personal history of colonic polyps: Secondary | ICD-10-CM

## 2014-07-25 MED ORDER — SODIUM CHLORIDE 0.9 % IV SOLN: 500.0000 mL | INTRAVENOUS | Status: DC

## 2014-07-25 NOTE — Progress Notes (Signed)
Patient awakening,vss,report to rn 

## 2014-07-25 NOTE — Progress Notes (Signed)
Called to room to assist during endoscopic procedure.  Patient ID and intended procedure confirmed with present staff. Received instructions for my participation in the procedure from the performing physician.  

## 2014-07-25 NOTE — Patient Instructions (Signed)

## 2014-07-25 NOTE — Op Note (Signed)
[place]  [employer]. [place], [id number]   COLONOSCOPY PROCEDURE REPORT  PATIENT: [name], [name]  MR#: [id number] BIRTHDATE: 1956/01/05 , 31  yrs. old GENDER: male ENDOSCOPIST: Eustace Quail, MD REFERRED ME:[id number] Program Recall PROCEDURE DATE:  07/25/2014 PROCEDURE:   Colonoscopy with snare polypectomy x 4 First Screening Colonoscopy - Avg.  risk and is 50 yrs.  old or older - No.  Prior Negative Screening - Now for repeat screening. N/A  History of Adenoma - Now for follow-up colonoscopy & has been > or = to 3 yrs.  Yes hx of adenoma.  Has been 3 or more years since last colonoscopy.  Polyps Removed Today? Yes. ASA CLASS:   Class III INDICATIONS:patient's immediate family history of colon cancer (brother 6 dx last month) and follow up of adenomatous colonic polyp(s) - 03-2008. MEDICATIONS: Monitored anesthesia care and Propofol 550 mg IV  DESCRIPTION OF PROCEDURE:   After the risks benefits and alternatives of the procedure were thoroughly explained, informed consent was obtained.  The digital rectal exam revealed no abnormalities of the rectum.   The [id number]  endoscope was introduced through the anus and advanced to the cecum, which was identified by both the appendix and ileocecal valve. No adverse events experienced.   The quality of the prep was good, using MoviPrep  The instrument was then slowly withdrawn as the colon was fully examined.  COLON FINDINGS: Four polyps ranging between 3-93mm in size were found in the descending colon (30mm) and at the cecum (67mm,6mm,9mm sessile).  A polypectomy was performed with a cold snare.  The resection was complete, the polyp tissue was completely retrieved and sent to histology.   There was severe diverticulosis noted in the right colon and left colon.   The examination was otherwise normal.  Retroflexed views revealed internal hemorrhoids. The time to cecum=2 minutes 24 seconds.   Withdrawal time=20 minutes 40 seconds.  The scope was withdrawn and the procedure completed. COMPLICATIONS: There were no immediate complications.  ENDOSCOPIC IMPRESSION: 1.   Four polyps were found in the descending colon and at the cecum; polypectomy was performed with a cold snare 2.   Severe diverticulosis was noted in the right colon and left colon 3.   The examination was otherwise normal  RECOMMENDATIONS: 1.  Repeat Colonoscopy in 3 years. 2.  Resume Pradaxa today  eSigned:  Eustace Quail, MD 07/25/2014 9:20 AM   cc: The Patient

## 2014-07-26 ENCOUNTER — Telehealth: Payer: Self-pay | Admitting: *Deleted

## 2014-07-26 NOTE — Telephone Encounter (Signed)
  Follow up Call-  Call back number 07/25/2014  Post procedure Call Back phone  # [phone]  Permission to leave phone message Yes     Patient questions:  Do you have a fever, pain , or abdominal swelling? No. Pain Score  0 *  Have you tolerated food without any problems? Yes.    Have you been able to return to your normal activities? Yes.    Do you have any questions about your discharge instructions: Diet   No. Medications  No. Follow up visit  No.  Do you have questions or concerns about your Care? No.  Actions: * If pain score is 4 or above: No action needed, pain <4.

## 2014-07-31 ENCOUNTER — Encounter: Payer: Self-pay | Admitting: Internal Medicine

## 2014-08-15 ENCOUNTER — Other Ambulatory Visit: Payer: Self-pay | Admitting: Internal Medicine

## 2014-08-24 ENCOUNTER — Other Ambulatory Visit: Payer: Self-pay | Admitting: Internal Medicine

## 2014-08-25 ENCOUNTER — Other Ambulatory Visit: Payer: Self-pay | Admitting: Internal Medicine

## 2014-10-04 ENCOUNTER — Ambulatory Visit (INDEPENDENT_AMBULATORY_CARE_PROVIDER_SITE_OTHER): Payer: BLUE CROSS/BLUE SHIELD | Admitting: Internal Medicine

## 2014-10-04 ENCOUNTER — Encounter: Payer: Self-pay | Admitting: Internal Medicine

## 2014-10-04 VITALS — BP 124/78 | HR 65 | Ht 73.0 in | Wt 300.4 lb

## 2014-10-04 DIAGNOSIS — I10 Essential (primary) hypertension: Secondary | ICD-10-CM

## 2014-10-04 DIAGNOSIS — I5022 Chronic systolic (congestive) heart failure: Secondary | ICD-10-CM | POA: Diagnosis not present

## 2014-10-04 DIAGNOSIS — G4730 Sleep apnea, unspecified: Secondary | ICD-10-CM

## 2014-10-04 DIAGNOSIS — I4891 Unspecified atrial fibrillation: Secondary | ICD-10-CM

## 2014-10-04 MED ORDER — SPIRONOLACTONE 25 MG PO TABS: 25.0000 mg | ORAL_TABLET | Freq: Every day | ORAL | Status: DC

## 2014-10-04 MED ORDER — ROSUVASTATIN CALCIUM 10 MG PO TABS: 10.0000 mg | ORAL_TABLET | Freq: Every day | ORAL | Status: DC

## 2014-10-04 MED ORDER — FUROSEMIDE 80 MG PO TABS: 40.0000 mg | ORAL_TABLET | Freq: Every day | ORAL | Status: DC | PRN

## 2014-10-04 MED ORDER — POTASSIUM CHLORIDE CRYS ER 20 MEQ PO TBCR: 20.0000 meq | EXTENDED_RELEASE_TABLET | Freq: Two times a day (BID) | ORAL | Status: DC

## 2014-10-04 MED ORDER — CARVEDILOL 25 MG PO TABS: 25.0000 mg | ORAL_TABLET | Freq: Two times a day (BID) | ORAL | Status: DC

## 2014-10-04 MED ORDER — VALSARTAN 320 MG PO TABS: 320.0000 mg | ORAL_TABLET | Freq: Every day | ORAL | Status: DC

## 2014-10-04 MED ORDER — DABIGATRAN ETEXILATE MESYLATE 150 MG PO CAPS: 150.0000 mg | ORAL_CAPSULE | Freq: Two times a day (BID) | ORAL | Status: DC

## 2014-10-04 NOTE — Progress Notes (Signed)
Electrophysiology Office Note   Date:  10/04/2014   ID:  [name], [name] Nov 07, 1955, MRN [id number]  PCP:  Olga Millers, MD   Primary Electrophysiologist: Thompson Grayer, MD    Chief Complaint  Patient presents with  . Atrial Fibrillation     History of Present Illness: [name] is a 59 y.o. male who presents today for electrophysiology evaluation.   He remains in sinus rhythm.  Denies symptoms of CHF.  Has not lost weight and is not very active.  Today, he denies symptoms of palpitations, chest pain, shortness of breath, orthopnea, PND, lower extremity edema, claudication, dizziness, presyncope, syncope, bleeding, or neurologic sequela. The patient is tolerating medications without difficulties and is otherwise without complaint today.    Past Medical History  Diagnosis Date  . Atrial fibrillation     persistant 02/2009  . Atrial flutter     s/p CTI ablation 08/06/10  . Nonischemic cardiomyopathy     tachycardia mediated  . Hyperlipidemia   . Morbid obesity     target weight = 219  for BMI < 30  . Hypertension   . Sleep apnea     original 2001  . Diverticulosis     colonoscopy 04/03/2009  . Chronic rhinitis   . Chronic systolic dysfunction of left ventricle   . Cardiac arrhythmia due to congenital heart disease   . Colon polyp   . Congestive heart failure   . Hypertension   . Allergy     SEASONAL  . Asthma   . Cataract     BEGINING   Past Surgical History  Procedure Laterality Date  . Atrial flutter ablation  08/06/10  . Colonoscopy       Current Outpatient Prescriptions  Medication Sig Dispense Refill  . carvedilol (COREG) 25 MG tablet Take 1 tablet (25 mg total) by mouth 2 (two) times daily with a meal. 180 tablet 3  . dabigatran (PRADAXA) 150 MG CAPS capsule Take 1 capsule (150 mg total) by mouth every 12 (twelve) hours. 180 capsule 3  . furosemide (LASIX) 80 MG tablet Take 0.5-1 tablets (40-80 mg total) by mouth daily as needed  (SWELLING). 90 tablet 3  . levothyroxine (SYNTHROID, LEVOTHROID) 150 MCG tablet Take 150 mcg by mouth daily before breakfast.    . mometasone (NASONEX) 50 MCG/ACT nasal spray Place 2 sprays into the nose daily as needed (allergies or rhinitis).     . potassium chloride SA (K-DUR,KLOR-CON) 20 MEQ tablet Take 1 tablet (20 mEq total) by mouth 2 (two) times daily. 182 tablet 3  . rosuvastatin (CRESTOR) 10 MG tablet Take 1 tablet (10 mg total) by mouth daily. 90 tablet 3  . sildenafil (VIAGRA) 100 MG tablet Take 1 tablet (100 mg total) by mouth as directed. 10 tablet 3  . spironolactone (ALDACTONE) 25 MG tablet Take 1 tablet (25 mg total) by mouth daily. 90 tablet 3  . valsartan (DIOVAN) 320 MG tablet Take 1 tablet (320 mg total) by mouth daily. 90 tablet 3  . budesonide-formoterol (SYMBICORT) 80-4.5 MCG/ACT inhaler Inhale 2 puffs into the lungs 2 (two) times daily. 2 puffs first thing in am and 2 puffs again in pm about 12 hours later PT HAS BEEN OUT OF THIS MEDICATION FOR AT LEAST 2 MONTHS - NEEDS REFILL ASAP     No current facility-administered medications for this visit.    Allergies:   Review of patient's allergies indicates no known allergies.   Social History:  The patient  reports that he has never smoked. He has never used smokeless tobacco. He reports that he drinks alcohol. He reports that he does not use illicit drugs.   Family History:  The patient's  family history includes COPD in his father; Colon cancer in his brother; Colon cancer (age of onset: 33) in his father; Hypertension in his mother; Kidney failure in his mother. There is no history of Colon polyps, Diabetes, Kidney disease, or Esophageal cancer.    ROS:  Please see the history of present illness.   All other systems are reviewed and negative.    PHYSICAL EXAM: VS:  BP 124/78 mmHg  Pulse 65  Ht 6\' 1"  (1.854 m)  Wt 300 lb 6.4 oz (136.261 kg)  BMI 39.64 kg/m2 , BMI Body mass index is 39.64 kg/(m^2). GEN: overweight,  in no acute distress HEENT: normal Neck: no JVD, carotid bruits, or masses Cardiac: RRR; no murmurs, rubs, or gallops,no edema  Respiratory:  clear to auscultation bilaterally, normal work of breathing GI: soft, nontender, nondistended, + BS MS: no deformity or atrophy Skin: warm and dry  Neuro:  Strength and sensation are intact Psych: euthymic mood, full affect  EKG:  EKG is ordered today. The ekg ordered today shows sinus rhythm with bifascicular block and occasional PVCs in couplets   Recent Labs: 10/07/2013: ALT 27; TSH 3.49 05/08/2014: BUN 32*; Creatinine 1.6*; Potassium 5.0; Sodium 137    Lipid Panel     Component Value Date/Time   CHOL 151 10/07/2013 1449   TRIG 136.0 10/07/2013 1449   HDL 46.00 10/07/2013 1449   CHOLHDL 3 10/07/2013 1449   VLDL 27.2 10/07/2013 1449   LDLCALC 78 10/07/2013 1449   LDLDIRECT 63.2 09/03/2012 0839     Wt Readings from Last 3 Encounters:  10/04/14 300 lb 6.4 oz (136.261 kg)  07/25/14 301 lb (136.533 kg)  06/29/14 299 lb 1.6 oz (135.671 kg)     ASSESSMENT AND PLAN:  1.  Persistent afib Doing very well s/p ablation Maintaining sinus rhythm off of AAD therapy The importance of lifestyle modification was stressed today.  Arrest AF and Cardiofit study results highlighting the importance of ETOH cessation, regular exercise, and weight reduction were discussed today. Continue pradaxa and coreg  2. Chronic systolic dysfunction/ PVCs in couplets- repeat echo bmet  Continue current medical therapy  3. HL Stable on crestor  check LFTs/Fasting lipids when he has his echo next week  4. Obesity- weight loss advised  5. OSA cpap  6. HTN bmet  7. Hypothyroidism Defer to primary care  Follow-up with Roderic Palau NP in the AF clinic in 52months Return in 12 months to see me  Current medicines are reviewed at length with the patient today.   The patient does not have concerns regarding his medicines.  The following changes were  made today:  none  Labs/ tests ordered today include:  Orders Placed This Encounter  Procedures  . Lipid Profile  . Hepatic function panel  . Basic metabolic panel  . Magnesium  . CBC with Differential  . EKG 12-Lead  . 2D Echocardiogram without contrast   Signed, Thompson Grayer, MD  10/04/2014 6:45 PM     [place] [address] [phone] (office) [phone] (fax)

## 2014-10-04 NOTE — Patient Instructions (Signed)
Medication Instructions:  Your physician recommends that you continue on your current medications as directed. Please refer to the Current Medication list given to you today.   Labwork: Fasting cholesterol/Liver/BMP/Mag/CBC  Testing/Procedures: Your physician has requested that you have an echocardiogram. Echocardiography is a painless test that uses sound waves to create images of your heart. It provides your doctor with information about the size and shape of your heart and how well your heart's chambers and valves are working. This procedure takes approximately one hour. There are no restrictions for this procedure.    Follow-Up: Your physician wants you to follow-up in: 6 months with Roderic Palau, NP and 12 months with Dr Vallery Ridge will receive a reminder letter in the mail two months in advance. If you don't receive a letter, please call our office to schedule the follow-up appointment.    Any Other Special Instructions Will Be Listed Below (If Applicable).

## 2014-10-09 ENCOUNTER — Other Ambulatory Visit (HOSPITAL_COMMUNITY): Payer: BLUE CROSS/BLUE SHIELD | Admitting: *Deleted

## 2014-10-09 ENCOUNTER — Other Ambulatory Visit (INDEPENDENT_AMBULATORY_CARE_PROVIDER_SITE_OTHER): Payer: BLUE CROSS/BLUE SHIELD | Admitting: *Deleted

## 2014-10-09 ENCOUNTER — Ambulatory Visit (HOSPITAL_COMMUNITY): Payer: BLUE CROSS/BLUE SHIELD | Attending: Internal Medicine | Admitting: Radiology

## 2014-10-09 DIAGNOSIS — I4891 Unspecified atrial fibrillation: Secondary | ICD-10-CM

## 2014-10-09 DIAGNOSIS — I5022 Chronic systolic (congestive) heart failure: Secondary | ICD-10-CM

## 2014-10-09 LAB — BASIC METABOLIC PANEL
BUN: 33 mg/dL — ABNORMAL HIGH (ref 6–23)
CHLORIDE: 105 meq/L (ref 96–112)
CO2: 25 mEq/L (ref 19–32)
CREATININE: 1.28 mg/dL (ref 0.40–1.50)
Calcium: 9.5 mg/dL (ref 8.4–10.5)
GFR: 60.98 mL/min (ref >60.00)
GLUCOSE: 154 mg/dL — AB (ref 70–99)
Potassium: 4.1 mEq/L (ref 3.5–5.1)
SODIUM: 136 meq/L (ref 135–145)

## 2014-10-09 LAB — CBC WITH DIFFERENTIAL/PLATELET
Basophils Absolute: 0 10*3/uL (ref 0.0–0.1)
Basophils Relative: 0.7 % (ref 0.0–3.0)
EOS PCT: 3.2 % (ref 0.0–5.0)
Eosinophils Absolute: 0.2 10*3/uL (ref 0.0–0.7)
HEMATOCRIT: 44.9 % (ref 39.0–52.0)
Hemoglobin: 15.4 g/dL (ref 13.0–17.0)
LYMPHS PCT: 22.3 % (ref 12.0–46.0)
Lymphs Abs: 1.1 10*3/uL (ref 0.7–4.0)
MCHC: 34.3 g/dL (ref 30.0–36.0)
MCV: 89.2 fl (ref 78.0–100.0)
MONO ABS: 0.4 10*3/uL (ref 0.1–1.0)
MONOS PCT: 7.9 % (ref 3.0–12.0)
Neutro Abs: 3.4 10*3/uL (ref 1.4–7.7)
Neutrophils Relative %: 65.9 % (ref 43.0–77.0)
PLATELETS: 155 10*3/uL (ref 150.0–400.0)
RBC: 5.04 Mil/uL (ref 4.22–5.81)
RDW: 13 % (ref 11.5–15.5)
WBC: 5.2 10*3/uL (ref 4.0–10.5)

## 2014-10-09 LAB — HEPATIC FUNCTION PANEL
ALT: 21 U/L (ref 0–53)
AST: 23 U/L (ref 0–37)
Albumin: 4.3 g/dL (ref 3.5–5.2)
Alkaline Phosphatase: 56 U/L (ref 39–117)
BILIRUBIN DIRECT: 0.1 mg/dL (ref 0.0–0.3)
BILIRUBIN TOTAL: 0.5 mg/dL (ref 0.2–1.2)
TOTAL PROTEIN: 7.5 g/dL (ref 6.0–8.3)

## 2014-10-09 LAB — MAGNESIUM: Magnesium: 2.2 mg/dL (ref 1.5–2.5)

## 2014-10-09 LAB — LIPID PANEL
CHOLESTEROL: 157 mg/dL (ref 0–200)
HDL: 46.7 mg/dL (ref >39.00)
LDL CALC: 81 mg/dL (ref 0–99)
NonHDL: 110.3
Total CHOL/HDL Ratio: 3
Triglycerides: 147 mg/dL (ref 0.0–149.0)
VLDL: 29.4 mg/dL (ref 0.0–40.0)

## 2014-10-09 MED ORDER — PERFLUTREN LIPID MICROSPHERE
1.0000 mL | INTRAVENOUS | Status: AC | PRN
  Administered 2014-10-09: 3 mL via INTRAVENOUS

## 2014-10-09 NOTE — Addendum Note (Signed)
Addended by: Eulis Foster on: 10/09/2014 08:10 AM   Modules accepted: Orders

## 2014-10-09 NOTE — Progress Notes (Signed)
Echocardiogram performed [facility]

## 2014-10-12 ENCOUNTER — Ambulatory Visit (INDEPENDENT_AMBULATORY_CARE_PROVIDER_SITE_OTHER): Payer: BLUE CROSS/BLUE SHIELD

## 2014-10-12 DIAGNOSIS — Z23 Encounter for immunization: Secondary | ICD-10-CM | POA: Diagnosis not present

## 2014-11-14 ENCOUNTER — Encounter: Payer: Self-pay | Admitting: Gastroenterology

## 2014-12-13 ENCOUNTER — Other Ambulatory Visit (INDEPENDENT_AMBULATORY_CARE_PROVIDER_SITE_OTHER): Payer: BLUE CROSS/BLUE SHIELD

## 2014-12-13 ENCOUNTER — Encounter: Payer: Self-pay | Admitting: Internal Medicine

## 2014-12-13 ENCOUNTER — Ambulatory Visit (INDEPENDENT_AMBULATORY_CARE_PROVIDER_SITE_OTHER): Payer: BLUE CROSS/BLUE SHIELD | Admitting: Internal Medicine

## 2014-12-13 VITALS — BP 120/80 | HR 56 | Temp 97.7°F | Resp 18 | Ht 73.0 in | Wt 301.0 lb

## 2014-12-13 DIAGNOSIS — M545 Low back pain, unspecified: Secondary | ICD-10-CM

## 2014-12-13 DIAGNOSIS — M25551 Pain in right hip: Secondary | ICD-10-CM | POA: Diagnosis not present

## 2014-12-13 DIAGNOSIS — R7301 Impaired fasting glucose: Secondary | ICD-10-CM

## 2014-12-13 LAB — HEMOGLOBIN A1C: Hgb A1c MFr Bld: 6 % (ref 4.6–6.5)

## 2014-12-13 MED ORDER — METHYLPREDNISOLONE ACETATE 40 MG/ML IJ SUSP
40.0000 mg | Freq: Once | INTRAMUSCULAR | Status: AC
  Administered 2014-12-13: 40 mg via INTRAMUSCULAR

## 2014-12-13 NOTE — Progress Notes (Signed)
Pre visit review using our clinic review tool, if applicable. No additional management support is needed unless otherwise documented below in the visit note. 

## 2014-12-13 NOTE — Patient Instructions (Signed)
We are going to check some blood work today to make sure you are not starting to get diabetes since some of the sugars have been high on the blood work lately.   We will call you back with the results.   We have given you a steroid shot which should clear up the pain. I'm also giving you some exercises for when the pain is gone to help strengthen the muscles and prevent you getting more injuries to the back.   Come back for the physical in the fall.   Back Exercises Back exercises help treat and prevent back injuries. The goal is to increase your strength in your belly (abdominal) and back muscles. These exercises can also help with flexibility. Start these exercises when told by your doctor. HOME CARE Back exercises include: Pelvic Tilt.  Lie on your back with your knees bent. Tilt your pelvis until the lower part of your back is against the floor. Hold this position 5 to 10 sec. Repeat this exercise 5 to 10 times. Knee to Chest.  Pull 1 knee up against your chest and hold for 20 to 30 seconds. Repeat this with the other knee. This may be done with the other leg straight or bent, whichever feels better. Then, pull both knees up against your chest. Sit-Ups or Curl-Ups.  Bend your knees 90 degrees. Start with tilting your pelvis, and do a partial, slow sit-up. Only lift your upper half 30 to 45 degrees off the floor. Take at least 2 to 3 seonds for each sit-up. Do not do sit-ups with your knees out straight. If partial sit-ups are difficult, simply do the above but with only tightening your belly (abdominal) muscles and holding it as told. Hip-Lift.  Lie on your back with your knees flexed 90 degrees. Push down with your feet and shoulders as you raise your hips 2 inches off the floor. Hold for 10 seconds, repeat 5 to 10 times. Back Arches.  Lie on your stomach. Prop yourself up on bent elbows. Slowly press on your hands, causing an arch in your low back. Repeat 3 to 5  times. Shoulder-Lifts.  Lie face down with arms beside your body. Keep hips and belly pressed to floor as you slowly lift your head and shoulders off the floor. Do not overdo your exercises. Be careful in the beginning. Exercises may cause you some mild back discomfort. If the pain lasts for more than 15 minutes, stop the exercises until you see your doctor. Improvement with exercise for back problems is slow.  Document Released: 07/05/2010 Document Revised: 08/25/2011 Document Reviewed: 04/03/2011 [facility] Patient Information 2015 [place], [place]. This information is not intended to replace advice given to you by your health care provider. Make sure you discuss any questions you have with your health care provider.

## 2014-12-14 DIAGNOSIS — M545 Low back pain, unspecified: Secondary | ICD-10-CM | POA: Insufficient documentation

## 2014-12-14 NOTE — Assessment & Plan Note (Signed)
Given depo-medrol 40 mg IM at visit. Also advised he can continue using alleve for pain. No red flag signs to suggest need for imaging.

## 2014-12-14 NOTE — Progress Notes (Signed)
   Subjective:    Patient ID: [name], male    DOB: 09-19-1955, 59 y.o.   MRN: [id number]  HPI The patient is a 59 YO man who started having some low back pain when he was golfing about 1-2 weeks ago. He stopped immediately and within a few days the pain improved. Then it worsened again 3-4 days ago and is still hurting. Right buttock and into the hip. Not all the way into the leg. No numbness or burning. Has tried alleve which helped some. No weight loss, change in bowels or bladder.   Review of Systems  Constitutional: Negative.   Respiratory: Negative.   Cardiovascular: Negative.   Gastrointestinal: Negative.   Musculoskeletal: Positive for myalgias and back pain. Negative for arthralgias and gait problem.  Skin: Negative.   Neurological: Negative.       Objective:   Physical Exam  Constitutional: He is oriented to person, place, and time. He appears well-developed and well-nourished.  Overweight  HENT:  Head: Normocephalic and atraumatic.  Eyes: EOM are normal.  Cardiovascular: Normal rate and regular rhythm.   Pulmonary/Chest: Effort normal. No respiratory distress. He has no wheezes.  Abdominal: Soft. He exhibits no distension. There is no tenderness. There is no rebound.  Musculoskeletal: He exhibits tenderness.  Tenderness in the gluteal right and into the trochanteric bursa. No pain in the groin right.   Neurological: He is alert and oriented to person, place, and time. Coordination normal.  Skin: Skin is warm and dry.   Filed Vitals:   12/13/14 1106  BP: 120/80  Pulse: 56  Temp: 97.7 F (36.5 C)  TempSrc: Oral  Resp: 18  Height: 6\' 1"  (1.854 m)  Weight: 301 lb (136.533 kg)  SpO2: 97%      Assessment & Plan:

## 2015-05-23 ENCOUNTER — Telehealth: Payer: Self-pay | Admitting: Internal Medicine

## 2015-05-23 NOTE — Telephone Encounter (Signed)
New message      Pt now has [employer].  For 2017 he will have [place].  Please call [facility] to get a prior authorization for prodaxa 150mg  cap for 2017.  Pt received a letter stating we need to call.  Please call pt and let him know this has been completed.

## 2015-05-31 NOTE — Telephone Encounter (Signed)
Patient states he will still be using [facility], and gave me his ID no. Will call if his number changes, in order for me to do a PA on Pradaxa.

## 2015-06-13 ENCOUNTER — Telehealth: Payer: Self-pay

## 2015-06-13 NOTE — Telephone Encounter (Signed)
Attempted to do a PA for Pradaxa 150mg . Apparently, it is not needed, per Rep at [facility]. Patient notified of this.

## 2015-06-14 NOTE — Telephone Encounter (Signed)
Patient states he received a letter from [employer] stating a PA was needed. Hopefully this is not for 2017.

## 2015-08-30 ENCOUNTER — Encounter: Payer: Self-pay | Admitting: Internal Medicine

## 2015-08-31 ENCOUNTER — Encounter: Payer: Self-pay | Admitting: Internal Medicine

## 2015-08-31 ENCOUNTER — Ambulatory Visit (INDEPENDENT_AMBULATORY_CARE_PROVIDER_SITE_OTHER): Payer: Managed Care, Other (non HMO) | Admitting: Internal Medicine

## 2015-08-31 ENCOUNTER — Other Ambulatory Visit (INDEPENDENT_AMBULATORY_CARE_PROVIDER_SITE_OTHER): Payer: Managed Care, Other (non HMO)

## 2015-08-31 VITALS — BP 102/70 | HR 81 | Temp 97.7°F | Resp 18 | Ht 73.0 in | Wt 305.8 lb

## 2015-08-31 DIAGNOSIS — G4730 Sleep apnea, unspecified: Secondary | ICD-10-CM | POA: Diagnosis not present

## 2015-08-31 DIAGNOSIS — E039 Hypothyroidism, unspecified: Secondary | ICD-10-CM

## 2015-08-31 DIAGNOSIS — I10 Essential (primary) hypertension: Secondary | ICD-10-CM

## 2015-08-31 DIAGNOSIS — Z0000 Encounter for general adult medical examination without abnormal findings: Secondary | ICD-10-CM | POA: Diagnosis not present

## 2015-08-31 DIAGNOSIS — J4520 Mild intermittent asthma, uncomplicated: Secondary | ICD-10-CM

## 2015-08-31 DIAGNOSIS — E785 Hyperlipidemia, unspecified: Secondary | ICD-10-CM

## 2015-08-31 DIAGNOSIS — R7989 Other specified abnormal findings of blood chemistry: Secondary | ICD-10-CM | POA: Diagnosis not present

## 2015-08-31 LAB — COMPREHENSIVE METABOLIC PANEL
ALK PHOS: 74 U/L (ref 39–117)
ALT: 38 U/L (ref 0–53)
AST: 24 U/L (ref 0–37)
Albumin: 4.2 g/dL (ref 3.5–5.2)
BILIRUBIN TOTAL: 0.8 mg/dL (ref 0.2–1.2)
BUN: 32 mg/dL — ABNORMAL HIGH (ref 6–23)
CALCIUM: 9.7 mg/dL (ref 8.4–10.5)
CO2: 27 meq/L (ref 19–32)
CREATININE: 1.64 mg/dL — AB (ref 0.40–1.50)
Chloride: 98 mEq/L (ref 96–112)
GFR: 45.67 mL/min — AB (ref >60.00)
GLUCOSE: 349 mg/dL — AB (ref 70–99)
Potassium: 4.4 mEq/L (ref 3.5–5.1)
Sodium: 133 mEq/L — ABNORMAL LOW (ref 135–145)
TOTAL PROTEIN: 7.6 g/dL (ref 6.0–8.3)

## 2015-08-31 LAB — CBC
HCT: 47.1 % (ref 39.0–52.0)
Hemoglobin: 16.4 g/dL (ref 13.0–17.0)
MCHC: 34.8 g/dL (ref 30.0–36.0)
MCV: 86.8 fl (ref 78.0–100.0)
Platelets: 176 10*3/uL (ref 150.0–400.0)
RBC: 5.43 Mil/uL (ref 4.22–5.81)
RDW: 12.8 % (ref 11.5–15.5)
WBC: 6.3 10*3/uL (ref 4.0–10.5)

## 2015-08-31 LAB — LIPID PANEL
Cholesterol: 166 mg/dL (ref 0–200)
HDL: 39.1 mg/dL (ref >39.00)
NONHDL: 126.78
TRIGLYCERIDES: 264 mg/dL — AB (ref 0.0–149.0)
Total CHOL/HDL Ratio: 4
VLDL: 52.8 mg/dL — ABNORMAL HIGH (ref 0.0–40.0)

## 2015-08-31 LAB — HEMOGLOBIN A1C: Hgb A1c MFr Bld: 9.5 % — ABNORMAL HIGH (ref 4.6–6.5)

## 2015-08-31 LAB — TSH: TSH: 3.51 u[IU]/mL (ref 0.35–4.50)

## 2015-08-31 LAB — T4, FREE: FREE T4: 0.75 ng/dL (ref 0.60–1.60)

## 2015-08-31 LAB — MAGNESIUM: MAGNESIUM: 2.1 mg/dL (ref 1.5–2.5)

## 2015-08-31 LAB — LDL CHOLESTEROL, DIRECT: Direct LDL: 81 mg/dL

## 2015-08-31 MED ORDER — BUDESONIDE-FORMOTEROL FUMARATE 80-4.5 MCG/ACT IN AERO: 2.0000 | INHALATION_SPRAY | Freq: Two times a day (BID) | RESPIRATORY_TRACT | Status: DC

## 2015-08-31 NOTE — Assessment & Plan Note (Signed)
He is not sure if he is taking the synthroid and will call us back. Checking TSH and free T4 and adjust as needed.

## 2015-08-31 NOTE — Assessment & Plan Note (Signed)
Taking crestor 10 mg daily and checking lipid panel, adjust as needed.

## 2015-08-31 NOTE — Progress Notes (Signed)
   Subjective:    Patient ID: [name], male    DOB: 01/20/1956, 60 y.o.   MRN: [name]  HPI The patient is a 60 YO man coming in for wellness. 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 No new complaints. Denies feeling like he has been in a fib.  PMH, [facility], social history reviewed and updated.   Review of Systems  Constitutional: Negative.   HENT: Negative.   Eyes: Negative.   Respiratory: Negative.   Cardiovascular: Negative.   Gastrointestinal: Negative.   Musculoskeletal: Negative for myalgias, back pain, arthralgias and gait problem.  Skin: Negative.   Neurological: Negative.   Psychiatric/Behavioral: Negative.       Objective:   Physical Exam  Constitutional: He is oriented to person, place, and time. He appears well-developed and well-nourished.  obese  HENT:  Head: Normocephalic and atraumatic.  Eyes: EOM are normal.  Neck: Normal range of motion.  Cardiovascular: Normal rate, regular rhythm and intact distal pulses.   No murmur heard. Pulmonary/Chest: Effort normal and breath sounds normal. No respiratory distress. He has no wheezes. He has no rales.  Abdominal: Soft. Bowel sounds are normal. He exhibits no distension. There is no tenderness. There is no rebound.  Musculoskeletal: He exhibits no edema.  Neurological: He is alert and oriented to person, place, and time. Coordination normal.  Skin: Skin is warm and dry.  Psychiatric: He has a normal mood and affect.   Filed Vitals:   08/31/15 0837  BP: 102/70  Pulse: 81  Temp: 97.7 F (36.5 C)  TempSrc: Oral  Resp: 18  Height: 6\' 1"  (1.854 m)  Weight: 305 lb 12.8 oz (138.71 kg)  SpO2: 97%      Assessment & Plan:

## 2015-08-31 NOTE — Assessment & Plan Note (Signed)
Mild intermittent and no flares in the last year. No flare today and refilled symbicort.

## 2015-08-31 NOTE — Assessment & Plan Note (Signed)
Still using CPAP

## 2015-08-31 NOTE — Progress Notes (Signed)
Pre visit review using our clinic review tool, if applicable. No additional management support is needed unless otherwise documented below in the visit note. 

## 2015-08-31 NOTE — Assessment & Plan Note (Signed)
BP at goal on coreg, lasix, spironolactone, valsartan. Checking CMP and adjust as needed.

## 2015-08-31 NOTE — Patient Instructions (Signed)
We are checking the labs today and will call you back with the results.   Work on getting active and exercising even before you retire but definitely after you retire. Work on losing about 10 pounds in the next year.   Come back in about 1 year or sooner if you have any problems or questions.   When you get back come in for the shingles shot.   Health Maintenance, Male A healthy lifestyle and preventative care can promote health and wellness.  Maintain regular health, dental, and eye exams.  Eat a healthy diet. Foods like vegetables, fruits, whole grains, low-fat dairy products, and lean protein foods contain the nutrients you need and are low in calories. Decrease your intake of foods high in solid fats, added sugars, and salt. Get information about a proper diet from your health care provider, if necessary.  Regular physical exercise is one of the most important things you can do for your health. Most adults should get at least 150 minutes of moderate-intensity exercise (any activity that increases your heart rate and causes you to sweat) each week. In addition, most adults need muscle-strengthening exercises on 2 or more days a week.   Maintain a healthy weight. The body mass index (BMI) is a screening tool to identify possible weight problems. It provides an estimate of body fat based on height and weight. Your health care provider can find your BMI and can help you achieve or maintain a healthy weight. For males 20 years and older:  A BMI below 18.5 is considered underweight.  A BMI of 18.5 to 24.9 is normal.  A BMI of 25 to 29.9 is considered overweight.  A BMI of 30 and above is considered obese.  Maintain normal blood lipids and cholesterol by exercising and minimizing your intake of saturated fat. Eat a balanced diet with plenty of fruits and vegetables. Blood tests for lipids and cholesterol should begin at age 36 and be repeated every 5 years. If your lipid or cholesterol  levels are high, you are over age 56, or you are at high risk for heart disease, you may need your cholesterol levels checked more frequently.Ongoing high lipid and cholesterol levels should be treated with medicines if diet and exercise are not working.  If you smoke, find out from your health care provider how to quit. If you do not use tobacco, do not start.  Lung cancer screening is recommended for adults aged 78-80 years who are at high risk for developing lung cancer because of a history of smoking. A yearly low-dose CT scan of the lungs is recommended for people who have at least a 30-pack-year history of smoking and are current smokers or have quit within the past 15 years. A pack year of smoking is smoking an average of 1 pack of cigarettes a day for 1 year (for example, a 30-pack-year history of smoking could mean smoking 1 pack a day for 30 years or 2 packs a day for 15 years). Yearly screening should continue until the smoker has stopped smoking for at least 15 years. Yearly screening should be stopped for people who develop a health problem that would prevent them from having lung cancer treatment.  If you choose to drink alcohol, do not have more than 2 drinks per day. One drink is considered to be 12 oz (360 mL) of beer, 5 oz (150 mL) of wine, or 1.5 oz (45 mL) of liquor.  Avoid the use of street drugs. Do  not share needles with anyone. Ask for help if you need support or instructions about stopping the use of drugs.  High blood pressure causes heart disease and increases the risk of stroke. High blood pressure is more likely to develop in:  People who have blood pressure in the end of the normal range (100-139/85-89 mm Hg).  People who are overweight or obese.  People who are African American.  If you are 63-28 years of age, have your blood pressure checked every 3-5 years. If you are 8 years of age or older, have your blood pressure checked every year. You should have your blood  pressure measured twice--once when you are at a hospital or clinic, and once when you are not at a hospital or clinic. Record the average of the two measurements. To check your blood pressure when you are not at a hospital or clinic, you can use:  An automated blood pressure machine at a pharmacy.  A home blood pressure monitor.  If you are 18-35 years old, ask your health care provider if you should take aspirin to prevent heart disease.  Diabetes screening involves taking a blood sample to check your fasting blood sugar level. This should be done once every 3 years after age 1 if you are at a normal weight and without risk factors for diabetes. Testing should be considered at a younger age or be carried out more frequently if you are overweight and have at least 1 risk factor for diabetes.  Colorectal cancer can be detected and often prevented. Most routine colorectal cancer screening begins at the age of 60 and continues through age 80. However, your health care provider may recommend screening at an earlier age if you have risk factors for colon cancer. On a yearly basis, your health care provider may provide home test kits to check for hidden blood in the stool. A small camera at the end of a tube may be used to directly examine the colon (sigmoidoscopy or colonoscopy) to detect the earliest forms of colorectal cancer. Talk to your health care provider about this at age 19 when routine screening begins. A direct exam of the colon should be repeated every 5-10 years through age 69, unless early forms of precancerous polyps or small growths are found.  People who are at an increased risk for hepatitis B should be screened for this virus. You are considered at high risk for hepatitis B if:  You were born in a country where hepatitis B occurs often. Talk with your health care provider about which countries are considered high risk.  Your parents were born in a high-risk country and you have not  received a shot to protect against hepatitis B (hepatitis B vaccine).  You have HIV or AIDS.  You use needles to inject street drugs.  You live with, or have sex with, someone who has hepatitis B.  You are a man who has sex with other men (MSM).  You get hemodialysis treatment.  You take certain medicines for conditions like cancer, organ transplantation, and autoimmune conditions.  Hepatitis C blood testing is recommended for all people born from [birth year] through 1966 and any individual with known risk factors for hepatitis C.  Healthy men should no longer receive prostate-specific antigen (PSA) blood tests as part of routine cancer screening. Talk to your health care provider about prostate cancer screening.  Testicular cancer screening is not recommended for adolescents or adult males who have no symptoms. Screening includes self-exam, a  health care provider exam, and other screening tests. Consult with your health care provider about any symptoms you have or any concerns you have about testicular cancer.  Practice safe sex. Use condoms and avoid high-risk sexual practices to reduce the spread of sexually transmitted infections (STIs).  You should be screened for STIs, including gonorrhea and chlamydia if:  You are sexually active and are younger than 24 years.  You are older than 24 years, and your health care provider tells you that you are at risk for this type of infection.  Your sexual activity has changed since you were last screened, and you are at an increased risk for chlamydia or gonorrhea. Ask your health care provider if you are at risk.  If you are at risk of being infected with HIV, it is recommended that you take a prescription medicine daily to prevent HIV infection. This is called pre-exposure prophylaxis (PrEP). You are considered at risk if:  You are a man who has sex with other men (MSM).  You are a heterosexual man who is sexually active with multiple  partners.  You take drugs by injection.  You are sexually active with a partner who has HIV.  Talk with your health care provider about whether you are at high risk of being infected with HIV. If you choose to begin PrEP, you should first be tested for HIV. You should then be tested every 3 months for as long as you are taking PrEP.  Use sunscreen. Apply sunscreen liberally and repeatedly throughout the day. You should seek shade when your shadow is shorter than you. Protect yourself by wearing long sleeves, pants, a wide-brimmed hat, and sunglasses year round whenever you are outdoors.  Tell your health care provider of new moles or changes in moles, especially if there is a change in shape or color. Also, tell your health care provider if a mole is larger than the size of a pencil eraser.  A one-time screening for abdominal aortic aneurysm (AAA) and surgical repair of large AAAs by ultrasound is recommended for men aged 76-75 years who are current or former smokers.  Stay current with your vaccines (immunizations).   This information is not intended to replace advice given to you by your health care provider. Make sure you discuss any questions you have with your health care provider.   Document Released: 11/29/2007 Document Revised: 06/23/2014 Document Reviewed: 10/28/2010 Elsevier Interactive Patient Education [employer].

## 2015-08-31 NOTE — Assessment & Plan Note (Signed)
Weight up from last year, checking HgA1c, BP at goal on meds. Complicated as well by OSA.

## 2015-08-31 NOTE — Assessment & Plan Note (Signed)
Will get shingles shot after his vacation. Checking labs today, reminded about the need for weight loss. Not exercising right now. Given screening recommendations.

## 2015-09-10 ENCOUNTER — Other Ambulatory Visit: Payer: Self-pay | Admitting: Internal Medicine

## 2015-09-10 MED ORDER — GLIPIZIDE 5 MG PO TABS: 5.0000 mg | ORAL_TABLET | Freq: Two times a day (BID) | ORAL | Status: DC

## 2015-09-12 ENCOUNTER — Ambulatory Visit: Payer: Managed Care, Other (non HMO)

## 2015-09-12 ENCOUNTER — Encounter: Payer: Self-pay | Admitting: Internal Medicine

## 2015-09-12 ENCOUNTER — Telehealth: Payer: Self-pay

## 2015-09-12 ENCOUNTER — Ambulatory Visit (INDEPENDENT_AMBULATORY_CARE_PROVIDER_SITE_OTHER): Payer: Managed Care, Other (non HMO) | Admitting: Internal Medicine

## 2015-09-12 VITALS — BP 128/70 | HR 70 | Ht 73.0 in | Wt 299.6 lb

## 2015-09-12 DIAGNOSIS — I481 Persistent atrial fibrillation: Secondary | ICD-10-CM | POA: Diagnosis not present

## 2015-09-12 DIAGNOSIS — G4730 Sleep apnea, unspecified: Secondary | ICD-10-CM

## 2015-09-12 DIAGNOSIS — I10 Essential (primary) hypertension: Secondary | ICD-10-CM

## 2015-09-12 DIAGNOSIS — I4819 Other persistent atrial fibrillation: Secondary | ICD-10-CM

## 2015-09-12 MED ORDER — DABIGATRAN ETEXILATE MESYLATE 150 MG PO CAPS: 150.0000 mg | ORAL_CAPSULE | Freq: Two times a day (BID) | ORAL | Status: DC

## 2015-09-12 MED ORDER — SILDENAFIL CITRATE 100 MG PO TABS: 100.0000 mg | ORAL_TABLET | ORAL | Status: DC

## 2015-09-12 MED ORDER — CARVEDILOL 25 MG PO TABS: 25.0000 mg | ORAL_TABLET | Freq: Two times a day (BID) | ORAL | Status: DC

## 2015-09-12 MED ORDER — ROSUVASTATIN CALCIUM 10 MG PO TABS: 10.0000 mg | ORAL_TABLET | Freq: Every day | ORAL | Status: DC

## 2015-09-12 MED ORDER — VALSARTAN 320 MG PO TABS: 320.0000 mg | ORAL_TABLET | Freq: Every day | ORAL | Status: DC

## 2015-09-12 MED ORDER — FUROSEMIDE 80 MG PO TABS: 40.0000 mg | ORAL_TABLET | Freq: Every day | ORAL | Status: DC | PRN

## 2015-09-12 MED ORDER — POTASSIUM CHLORIDE CRYS ER 20 MEQ PO TBCR: 20.0000 meq | EXTENDED_RELEASE_TABLET | Freq: Two times a day (BID) | ORAL | Status: DC

## 2015-09-12 MED ORDER — SPIRONOLACTONE 25 MG PO TABS: 25.0000 mg | ORAL_TABLET | Freq: Every day | ORAL | Status: DC

## 2015-09-12 NOTE — Progress Notes (Signed)
Electrophysiology Office Note   Date:  09/12/2015   ID:  Rod, Venturino August 15, 1955, MRN [name]  PCP:  [name], MD   Primary Electrophysiologist: Thompson Grayer, MD    Chief Complaint  Patient presents with  . Follow-up    pt states no chest pain     History of Present Illness: [name] is a 61 y.o. male who presents today for electrophysiology evaluation.   He remains in sinus rhythm.  Denies symptoms of CHF.  Has not lost weight and is not very active. Diabetes management has been difficult recently.  Planning to retire before long.  Recently went to [place] for [employer] spring training and had a good time.  Says he is not drinking "too much". Today, he denies symptoms of palpitations, chest pain, shortness of breath, orthopnea, PND, lower extremity edema, claudication, dizziness, presyncope, syncope, bleeding, or neurologic sequela. The patient is tolerating medications without difficulties and is otherwise without complaint today.    Past Medical History  Diagnosis Date  . Atrial fibrillation ([place])     persistant 02/2009  . Atrial flutter ([place])     s/p CTI ablation 08/06/10  . Nonischemic cardiomyopathy ([facility])     tachycardia mediated  . Hyperlipidemia   . Morbid obesity ([place])     target weight = 219  for BMI < 30  . Hypertension   . Sleep apnea     original 2001  . Diverticulosis     colonoscopy 04/03/2009  . Chronic rhinitis   . Chronic systolic dysfunction of left ventricle   . Cardiac arrhythmia due to congenital heart disease   . Colon polyp   . Congestive heart failure ([place])   . Hypertension   . Allergy     SEASONAL  . Asthma   . Cataract     BEGINING   Past Surgical History  Procedure Laterality Date  . Atrial flutter ablation  08/06/10  . Colonoscopy       Current Outpatient Prescriptions  Medication Sig Dispense Refill  . budesonide-formoterol (SYMBICORT) 80-4.5 MCG/ACT inhaler Inhale 2 puffs into the lungs 2 (two)  times daily. 3 Inhaler 3  . carvedilol (COREG) 25 MG tablet Take 1 tablet (25 mg total) by mouth 2 (two) times daily with a meal. 180 tablet 3  . dabigatran (PRADAXA) 150 MG CAPS capsule Take 1 capsule (150 mg total) by mouth every 12 (twelve) hours. 180 capsule 3  . furosemide (LASIX) 80 MG tablet Take 0.5-1 tablets (40-80 mg total) by mouth daily as needed (SWELLING). 90 tablet 3  . glipiZIDE (GLUCOTROL) 5 MG tablet Take 1 tablet (5 mg total) by mouth 2 (two) times daily before a meal. 60 tablet 3  . mometasone (NASONEX) 50 MCG/ACT nasal spray Place 2 sprays into the nose daily as needed (allergies or rhinitis).     . potassium chloride SA (K-DUR,KLOR-CON) 20 MEQ tablet TAKE 1 TABLET TWICE A DAY 182 tablet 0  . rosuvastatin (CRESTOR) 10 MG tablet Take 1 tablet (10 mg total) by mouth daily. 90 tablet 3  . sildenafil (VIAGRA) 100 MG tablet Take 1 tablet (100 mg total) by mouth as directed. 10 tablet 3  . spironolactone (ALDACTONE) 25 MG tablet Take 1 tablet (25 mg total) by mouth daily. 90 tablet 3  . valsartan (DIOVAN) 320 MG tablet Take 1 tablet (320 mg total) by mouth daily. 90 tablet 3   No current facility-administered medications for this visit.    Allergies:  Review of patient's allergies indicates no known allergies.   Social History:  The patient  reports that he has never smoked. He has never used smokeless tobacco. He reports that he drinks alcohol. He reports that he does not use illicit drugs.   Family History:  The patient's  family history includes COPD in his father; Colon cancer in his brother; Colon cancer (age of onset: 36) in his father; Hypertension in his mother; Kidney failure in his mother. There is no history of Colon polyps, Diabetes, Kidney disease, or Esophageal cancer.    ROS:  Please see the history of present illness.   All other systems are reviewed and negative.    PHYSICAL EXAM: VS:  BP 128/70 mmHg  Pulse 70  Ht 6\' 1"  (1.854 m)  Wt 299 lb 9.6 oz  (135.898 kg)  BMI 39.54 kg/m2 , BMI Body mass index is 39.54 kg/(m^2). GEN: overweight, in no acute distress HEENT: normal Neck: no JVD, carotid bruits, or masses Cardiac: RRR; no murmurs, rubs, or gallops,no edema  Respiratory:  clear to auscultation bilaterally, normal work of breathing GI: soft, nontender, nondistended, + BS MS: no deformity or atrophy Skin: warm and dry  Neuro:  Strength and sensation are intact Psych: euthymic mood, full affect  EKG:  EKG is ordered today. The ekg ordered today shows sinus rhythm with bifascicular block and occasional PVCs in couplets   Recent Labs: 08/31/2015: ALT 38; BUN 32*; Creatinine, Ser 1.64*; Hemoglobin 16.4; Magnesium 2.1; Platelets 176.0; Potassium 4.4; Sodium 133*; TSH 3.51    Lipid Panel     Component Value Date/Time   CHOL 166 08/31/2015 0905   TRIG 264.0* 08/31/2015 0905   HDL 39.10 08/31/2015 0905   CHOLHDL 4 08/31/2015 0905   VLDL 52.8* 08/31/2015 0905   LDLCALC 81 10/09/2014 0810   LDLDIRECT 81.0 08/31/2015 0905     Wt Readings from Last 3 Encounters:  09/12/15 299 lb 9.6 oz (135.898 kg)  08/31/15 305 lb 12.8 oz (138.71 kg)  12/13/14 301 lb (136.533 kg)     ASSESSMENT AND PLAN:  1.  Persistent afib Maintaining sinus rhythm off of AAD therapy The importance of lifestyle modification was stressed today.  Continue pradaxa and coreg  2. Chronic systolic dysfunction/ PVCs in couplets- repeat echo upon return in 1 year Continue current medical therapy  3. HL Recent labs reviewed  4. Obesity- weight loss advised  5. OSA cpap  6. HTN Recent labs reviewed   Return in 12 months to see me  Current medicines are reviewed at length with the patient today.   The patient does not have concerns regarding his medicines.  The following changes were made today:  none  Signed, Thompson Grayer, MD  09/12/2015 11:08 AM     [facility] [address] [place] [address] [phone]  (office) [phone] (fax)

## 2015-09-12 NOTE — Telephone Encounter (Signed)
Prior auth for Prad[facility]a sent to [facility].

## 2015-09-12 NOTE — Patient Instructions (Addendum)
Medication Instructions:  Your physician recommends that you continue on your current medications as directed. Please refer to the Current Medication list given to you today.   Labwork: None ordered   Testing/Procedures: Your physician has requested that you have an echocardiogram. Echocardiography is a painless test that uses sound waves to create images of your heart. It provides your doctor with information about the size and shape of your heart and how well your heart's chambers and valves are working. This procedure takes approximately one hour. There are no restrictions for this procedure.---few days prior to appointment with Allred    Follow-Up: Your physician wants you to follow-up in: 12 months with Dr Vallery Ridge will receive a reminder letter in the mail two months in advance. If you don't receive a letter, please call our office to schedule the follow-up appointment.   Any Other Special Instructions Will Be Listed Below (If Applicable).     If you need a refill on your cardiac medications before your next appointment, please call your pharmacy.

## 2015-09-13 ENCOUNTER — Telehealth: Payer: Self-pay | Admitting: Internal Medicine

## 2015-09-13 MED ORDER — SILDENAFIL CITRATE 100 MG PO TABS: 100.0000 mg | ORAL_TABLET | ORAL | Status: DC

## 2015-09-13 NOTE — Telephone Encounter (Signed)
Says can get from [facility], so i sent it in

## 2015-09-13 NOTE — Telephone Encounter (Signed)
F/u  Pt returning RN phone call. Please call back and discuss.   

## 2015-09-13 NOTE — Addendum Note (Signed)
Addended by: Freada Bergeron on: 09/13/2015 06:04 PM   Modules accepted: Orders

## 2015-09-14 ENCOUNTER — Telehealth: Payer: Self-pay

## 2015-09-14 NOTE — Telephone Encounter (Signed)
Left message advising patient to call back to schedule nurse visit to get shingles vaccine----also make sure of insurance coverage/copay/out of pocket cost ($300 vaccine out of pocket)

## 2015-09-14 NOTE — Telephone Encounter (Signed)
Pradaxa has been approved through 09/11/2016.

## 2015-09-17 ENCOUNTER — Ambulatory Visit: Payer: BLUE CROSS/BLUE SHIELD | Admitting: Internal Medicine

## 2015-09-17 ENCOUNTER — Ambulatory Visit (INDEPENDENT_AMBULATORY_CARE_PROVIDER_SITE_OTHER): Payer: Managed Care, Other (non HMO) | Admitting: Geriatric Medicine

## 2015-09-17 ENCOUNTER — Telehealth: Payer: Self-pay

## 2015-09-17 DIAGNOSIS — Z418 Encounter for other procedures for purposes other than remedying health state: Secondary | ICD-10-CM | POA: Diagnosis not present

## 2015-09-17 DIAGNOSIS — Z299 Encounter for prophylactic measures, unspecified: Secondary | ICD-10-CM

## 2015-09-17 DIAGNOSIS — Z23 Encounter for immunization: Secondary | ICD-10-CM | POA: Diagnosis not present

## 2015-09-17 NOTE — Telephone Encounter (Signed)
Prior auth for Viagra 100mg  sent to [facility].

## 2015-09-18 ENCOUNTER — Telehealth: Payer: Self-pay

## 2015-09-18 NOTE — Telephone Encounter (Signed)
Viagra approved by [facility] through 09/16/2016. Local pharmacy notified.

## 2015-11-02 ENCOUNTER — Telehealth: Payer: Self-pay | Admitting: Internal Medicine

## 2015-11-02 NOTE — Telephone Encounter (Signed)
[name] called [name] on 09-13-15 and left vm to call and schedule Echo.  [name] called on 09-26-15 and I called on 10-11-15.  [name] has not return the call to schedule the test.

## 2015-12-11 ENCOUNTER — Other Ambulatory Visit: Payer: Self-pay | Admitting: Internal Medicine

## 2015-12-11 NOTE — Telephone Encounter (Signed)
potassium chloride SA (K-DUR,KLOR-CON) 20 MEQ tablet  Medication   Date: 09/12/2015  Department: [address] Office  Ordering/Authorizing: Thompson Grayer, MD      Order Providers    Prescribing Provider Encounter Provider   Thompson Grayer, MD Thompson Grayer, MD    Medication Detail      Disp Refills Start End     potassium chloride SA (K-DUR,KLOR-CON) 20 MEQ tablet 180 tablet 3 09/12/2015     Sig - Route: Take 1 tablet (20 mEq total) by mouth 2 (two) times daily. - Oral    Notes to Pharmacy: Please ask patient to call for Appt/Dr. Allred [phone].    E-Prescribing Status: Receipt confirmed by pharmacy (09/12/2015 11:41 AM EDT)     Pharmacy    [place], [place]

## 2015-12-12 ENCOUNTER — Other Ambulatory Visit: Payer: Self-pay | Admitting: Internal Medicine

## 2015-12-31 ENCOUNTER — Other Ambulatory Visit: Payer: Self-pay | Admitting: *Deleted

## 2015-12-31 MED ORDER — ROSUVASTATIN CALCIUM 10 MG PO TABS: 10.0000 mg | ORAL_TABLET | Freq: Every day | ORAL | Status: DC

## 2015-12-31 MED ORDER — POTASSIUM CHLORIDE CRYS ER 20 MEQ PO TBCR: 20.0000 meq | EXTENDED_RELEASE_TABLET | Freq: Two times a day (BID) | ORAL | Status: DC

## 2015-12-31 NOTE — Telephone Encounter (Signed)
Patient called and stated that he needed rx's for crestor and potassium [facility]. Per epic these were sent in at his office visit in March but he stated that his employer had his rx's cancelled when his  insurance was dropped and he is now covered through [place]. He stated that [facility] needs them to be resent.

## 2016-01-23 ENCOUNTER — Telehealth: Payer: Self-pay | Admitting: Internal Medicine

## 2016-01-23 MED ORDER — GLIPIZIDE 5 MG PO TABS: 5.0000 mg | ORAL_TABLET | Freq: Two times a day (BID) | ORAL | 2 refills | Status: DC

## 2016-01-23 NOTE — Telephone Encounter (Signed)
[facility].../lmb 

## 2016-01-23 NOTE — Telephone Encounter (Signed)
Pt request refill for glipiZIDE (GLUCOTROL) 5 MG tablet [facility]

## 2016-04-14 ENCOUNTER — Telehealth: Payer: Self-pay | Admitting: Internal Medicine

## 2016-04-14 NOTE — Telephone Encounter (Signed)
°  1. What dental office are you calling from?   Calling for instructions regarding Pradaxa as pt has a tooth extraction planned    2. What is your office phone and fax number?         [phone]   3. What type of procedure is the patient having performed?  4. Tooth Extraction    5. What date is procedure scheduled?   Not scheduled yet   6. What is your question (ex. Antibiotics prior to procedure, holding medication-we need to know how long dentist wants pt to hold med)?   Instructions for Pradaxa

## 2016-04-15 NOTE — Telephone Encounter (Signed)
Spoke with Dr Mingo Amber office and advised them that we recommend pt stay on his Pradaxa for single dental extraction. They verbalized understanding.

## 2016-05-07 ENCOUNTER — Other Ambulatory Visit: Payer: Self-pay | Admitting: *Deleted

## 2016-05-07 ENCOUNTER — Encounter: Payer: Self-pay | Admitting: Nurse Practitioner

## 2016-05-07 ENCOUNTER — Ambulatory Visit (INDEPENDENT_AMBULATORY_CARE_PROVIDER_SITE_OTHER): Payer: Managed Care, Other (non HMO) | Admitting: Nurse Practitioner

## 2016-05-07 VITALS — BP 132/86 | HR 62 | Temp 97.5°F | Ht 73.0 in | Wt 308.0 lb

## 2016-05-07 DIAGNOSIS — S46812A Strain of other muscles, fascia and tendons at shoulder and upper arm level, left arm, initial encounter: Secondary | ICD-10-CM

## 2016-05-07 MED ORDER — DICLOFENAC SODIUM 2 % TD SOLN
1.0000 | Freq: Two times a day (BID) | TRANSDERMAL | 0 refills | Status: DC | PRN
Start: 2016-05-07 — End: 2016-05-29

## 2016-05-07 MED ORDER — METHYLPREDNISOLONE ACETATE 40 MG/ML IJ SUSP
10.0000 mg | Freq: Once | INTRAMUSCULAR | Status: AC
Start: 2016-05-07 — End: 2016-05-07
  Administered 2016-05-07: 10 mg via INTRAMUSCULAR

## 2016-05-07 MED ORDER — CYCLOBENZAPRINE HCL 10 MG PO TABS: 10.0000 mg | ORAL_TABLET | Freq: Every day | ORAL | 0 refills | Status: DC

## 2016-05-07 NOTE — Progress Notes (Signed)
Pre visit review using our clinic review tool, if applicable. No additional management support is needed unless otherwise documented below in the visit note. 

## 2016-05-07 NOTE — Patient Instructions (Addendum)
Rest shoulder muscle for 24hrs. Apply cold compress every 2hrs for 15-69mins at a time.  Return to office if no improvement in 1week.   Muscle Strain A muscle strain (pulled muscle) happens when a muscle is stretched beyond normal length. It happens when a sudden, violent force stretches your muscle too far. Usually, a few of the fibers in your muscle are torn. Muscle strain is common in athletes. Recovery usually takes 1-2 weeks. Complete healing takes 5-6 weeks. Follow these instructions at home:  Follow the PRICE method of treatment to help your injury get better. Do this the first 2-3 days after the injury:  Protect. Protect the muscle to keep it from getting injured again.  Rest. Limit your activity and rest the injured body part.  Ice. Put ice in a plastic bag. Place a towel between your skin and the bag. Then, apply the ice and leave it on from 15-20 minutes each hour. After the third day, switch to moist heat packs.  Compression. Use a splint or elastic bandage on the injured area for comfort. Do not put it on too tightly.  Elevate. Keep the injured body part above the level of your heart.  Only take medicine as told by your doctor.  Warm up before doing exercise to prevent future muscle strains. Contact a doctor if:  You have more pain or puffiness (swelling) in the injured area.  You feel numbness, tingling, or notice a loss of strength in the injured area. This information is not intended to replace advice given to you by your health care provider. Make sure you discuss any questions you have with your health care provider. Document Released: 03/11/2008 Document Revised: 11/08/2015 Document Reviewed: 12/30/2012 Elsevier Interactive Patient Education  2017 [employer].

## 2016-05-07 NOTE — Progress Notes (Signed)
Subjective:  Patient ID: [name], male    DOB: 08/04/1955  Age: 60 y.o. MRN: [name]  CC: Shoulder Pain (pt stated left shoulder pain,weak hand grip 1 week. )   Shoulder Pain   The pain is present in the left shoulder. This is a new problem. The current episode started in the past 7 days. There has been no history of extremity trauma. The problem occurs constantly. The problem has been gradually worsening. The quality of the pain is described as aching and sharp (and cramping). The pain is at a severity of 10/10. The pain is severe. Associated symptoms include stiffness. Pertinent negatives include no fever, inability to bear weight, itching, joint locking, joint swelling, limited range of motion, numbness or tingling. The symptoms are aggravated by activity and lying down (laying on left side). He has tried acetaminophen, NSAIDS, heat, movement, rest and oral narcotics (and TENS unit) for the symptoms. The treatment provided no relief. Family history does not include gout or rheumatoid arthritis. There is no history of diabetes, gout, osteoarthritis or rheumatoid arthritis.    Outpatient Medications Prior to Visit  Medication Sig Dispense Refill  . budesonide-formoterol (SYMBICORT) 80-4.5 MCG/ACT inhaler Inhale 2 puffs into the lungs 2 (two) times daily. 3 Inhaler 3  . carvedilol (COREG) 25 MG tablet Take 1 tablet (25 mg total) by mouth 2 (two) times daily with a meal. 180 tablet 3  . dabigatran (PRADAXA) 150 MG CAPS capsule Take 1 capsule (150 mg total) by mouth every 12 (twelve) hours. 180 capsule 3  . furosemide (LASIX) 80 MG tablet Take 0.5-1 tablets (40-80 mg total) by mouth daily as needed (SWELLING). 90 tablet 3  . glipiZIDE (GLUCOTROL) 5 MG tablet Take 1 tablet (5 mg total) by mouth 2 (two) times daily before a meal. 180 tablet 2  . HYDROcodone-acetaminophen (NORCO/VICODIN) 5-325 MG tablet     . mometasone (NASONEX) 50 MCG/ACT nasal spray Place 2 sprays into the nose daily as  needed (allergies or rhinitis).     . potassium chloride SA (K-DUR,KLOR-CON) 20 MEQ tablet Take 1 tablet (20 mEq total) by mouth 2 (two) times daily. 180 tablet 2  . rosuvastatin (CRESTOR) 10 MG tablet Take 1 tablet (10 mg total) by mouth daily. 90 tablet 2  . sildenafil (VIAGRA) 100 MG tablet Take 1 tablet (100 mg total) by mouth as directed. 15 tablet 3  . spironolactone (ALDACTONE) 25 MG tablet Take 1 tablet (25 mg total) by mouth daily. 90 tablet 3  . valsartan (DIOVAN) 320 MG tablet Take 1 tablet (320 mg total) by mouth daily. 90 tablet 3   No facility-administered medications prior to visit.     ROS See HPI  Objective:  BP 132/86 (BP Location: Right Arm, Patient Position: Sitting, Cuff Size: Normal)   Pulse 62   Temp 97.5 F (36.4 C)   Ht 6\' 1"  (1.854 m)   Wt (!) 308 lb (139.7 kg)   SpO2 97%   BMI 40.64 kg/m   BP Readings from Last 3 Encounters:  05/07/16 132/86  09/12/15 128/70  08/31/15 102/70    Wt Readings from Last 3 Encounters:  05/07/16 (!) 308 lb (139.7 kg)  09/12/15 299 lb 9.6 oz (135.9 kg)  08/31/15 (!) 305 lb 12.8 oz (138.7 kg)    Physical Exam  Constitutional: He is oriented to person, place, and time.  Eyes: Conjunctivae and EOM are normal. Pupils are equal, round, and reactive to light. No scleral icterus.  Neck: Normal range  of motion. Neck supple.  Cardiovascular: Normal rate.   Pulmonary/Chest: Effort normal.  Musculoskeletal: Normal range of motion. He exhibits tenderness. He exhibits no edema.       Right shoulder: Normal.       Left shoulder: He exhibits tenderness and spasm. He exhibits normal range of motion, no bony tenderness, no effusion, no crepitus, no deformity, no pain, normal pulse and normal strength.       Cervical back: Normal.       Arms: Lymphadenopathy:    He has no cervical adenopathy.  Neurological: He is alert and oriented to person, place, and time. He has normal reflexes.  Skin: Skin is warm and dry. No rash noted. No  erythema.    Lab Results  Component Value Date   WBC 6.3 08/31/2015   HGB 16.4 08/31/2015   HCT 47.1 08/31/2015   PLT 176.0 08/31/2015   GLUCOSE 349 (H) 08/31/2015   CHOL 166 08/31/2015   TRIG 264.0 (H) 08/31/2015   HDL 39.10 08/31/2015   LDLDIRECT 81.0 08/31/2015   LDLCALC 81 10/09/2014   ALT 38 08/31/2015   AST 24 08/31/2015   NA 133 (L) 08/31/2015   K 4.4 08/31/2015   CL 98 08/31/2015   CREATININE 1.64 (H) 08/31/2015   BUN 32 (H) 08/31/2015   CO2 27 08/31/2015   TSH 3.51 08/31/2015   PSA 4.06 (H) 03/08/2008   INR 1.4 02/06/2010   HGBA1C 9.5 (H) 08/31/2015    Procedure Note :     Procedure : Trigger Point to left trapezius muscle   Indication:  Left trapezius muscle spasm.   Risks including unsuccessful procedure , bleeding, infection, bruising, skin atrophy, "steroid flare-up" and others were explained to the patient in detail as well as the benefits. Informed consent was obtained and signed.  Verbal consent obtained. Tthe patient was placed in a comfortable position. Lateral approach was used. Skin was prepped with Betadine and alcohol. Then, a 10cc syringe with a 1 inch long 27 gauge needle was used for trigger point injection.. The needle was advanced  Into the left trapezius muscle. I pulled the plunger back to confirm correct placement of the needle into muscle and not lung space. I injected 55mL of 0.5% sensocaine and 10 mg of Depo-Medrol, 1inch apart x 3 locations .  Band-Aid was applied.   Tolerated well. Complications: None. Good pain relief following the procedure.   Postprocedure instructions :    A Band-Aid should be left on for 12 hours. Injection therapy is not a cure itself. It is used in conjunction with other modalities. You can use nonsteroidal anti-inflammatories like Pennsaid gel and  cold compresses. Rest is recommended in the next 24 hours. You need to report immediately  if fever, chills or any signs of infection develop.   Assessment & Plan:    Rexx was seen today for shoulder pain.  Diagnoses and all orders for this visit:  Trapezius muscle strain, left, initial encounter -     cyclobenzaprine (FLEXERIL) 10 MG tablet; Take 1 tablet (10 mg total) by mouth at bedtime. -     Diclofenac Sodium (PENNSAID) 2 % SOLN; Place 1 application onto the skin 2 (two) times daily as needed.   I am having Mr. Hufnagle start on cyclobenzaprine and Diclofenac Sodium. I am also having him maintain his mometasone, budesonide-formoterol, valsartan, spironolactone, furosemide, dabigatran, carvedilol, sildenafil, potassium chloride SA, rosuvastatin, glipiZIDE, and HYDROcodone-acetaminophen.  Meds ordered this encounter  Medications  . cyclobenzaprine (FLEXERIL) 10 MG  tablet    Sig: Take 1 tablet (10 mg total) by mouth at bedtime.    Dispense:  14 tablet    Refill:  0    Order Specific Question:   Supervising Provider    Answer:   Cassandria Anger [[id number]]  . Diclofenac Sodium (PENNSAID) 2 % SOLN    Sig: Place 1 application onto the skin 2 (two) times daily as needed.    Dispense:  2 g    Refill:  0    Order Specific Question:   Supervising Provider    Answer:   Cassandria Anger [[id number]]    Follow-up: No Follow-up on file.  [name], NP

## 2016-05-13 ENCOUNTER — Telehealth: Payer: Self-pay | Admitting: Nurse Practitioner

## 2016-05-13 ENCOUNTER — Other Ambulatory Visit: Payer: Self-pay | Admitting: Nurse Practitioner

## 2016-05-13 DIAGNOSIS — R202 Paresthesia of skin: Secondary | ICD-10-CM

## 2016-05-13 DIAGNOSIS — S46812A Strain of other muscles, fascia and tendons at shoulder and upper arm level, left arm, initial encounter: Secondary | ICD-10-CM

## 2016-05-13 MED ORDER — PREDNISONE 10 MG (21) PO TBPK: 10.0000 mg | ORAL_TABLET | ORAL | 0 refills | Status: DC

## 2016-05-13 NOTE — Telephone Encounter (Signed)
Patient needs to return to office for re eval of left shoulder and arm. He is schdeuled to return to office 05/15/16 at 1pm per his request. He is to go to basement for x-ray prior to Ov.  I also sent prescription for oral prednisone. He is to start prescription today. Thank you

## 2016-05-13 NOTE — Progress Notes (Signed)
Patient advised to return to office for further evaluation. He has been scheduled to return 05/15/16 at 1pm. He is to also go to basement for cervical spine x-ray prior to f/up appt.

## 2016-05-13 NOTE — Telephone Encounter (Signed)
Patient states he was to call with an update from last OV with [facility].  Patient states Shoulder is feeling better but he still has a knot in his left shoulder and he can not make a grip with his left hand.  Also, states that he has not strength in ring finger or pinky finger on the left hand.  Please follow up with patient in regard.

## 2016-05-13 NOTE — Telephone Encounter (Signed)
Routing to [place], please advise, thanks 

## 2016-05-14 NOTE — Telephone Encounter (Signed)
Advised patient of [place] note/instructions---he is coming in for xray before visit---picking up rx today for prednisone

## 2016-05-15 ENCOUNTER — Ambulatory Visit (INDEPENDENT_AMBULATORY_CARE_PROVIDER_SITE_OTHER): Payer: Managed Care, Other (non HMO) | Admitting: Nurse Practitioner

## 2016-05-15 ENCOUNTER — Encounter: Payer: Self-pay | Admitting: Nurse Practitioner

## 2016-05-15 ENCOUNTER — Ambulatory Visit (INDEPENDENT_AMBULATORY_CARE_PROVIDER_SITE_OTHER)
Admission: RE | Admit: 2016-05-15 | Discharge: 2016-05-15 | Disposition: A | Discharge status: Home / Self Care | Payer: Managed Care, Other (non HMO) | Source: Ambulatory Visit | Attending: Nurse Practitioner | Admitting: Nurse Practitioner

## 2016-05-15 DIAGNOSIS — M5412 Radiculopathy, cervical region: Secondary | ICD-10-CM | POA: Diagnosis not present

## 2016-05-15 DIAGNOSIS — S46812A Strain of other muscles, fascia and tendons at shoulder and upper arm level, left arm, initial encounter: Secondary | ICD-10-CM | POA: Insufficient documentation

## 2016-05-15 DIAGNOSIS — R202 Paresthesia of skin: Secondary | ICD-10-CM

## 2016-05-15 DIAGNOSIS — Z23 Encounter for immunization: Secondary | ICD-10-CM

## 2016-05-15 DIAGNOSIS — S46812S Strain of other muscles, fascia and tendons at shoulder and upper arm level, left arm, sequela: Secondary | ICD-10-CM

## 2016-05-15 NOTE — Patient Instructions (Addendum)
Complete  Oral prednisone as prescribed. Use muscle relaxant as prescribed.  Follow-up with Dr. Tamala Julian in 7-10 days. Call office sooner if symptoms worsen.  [name] was advised about possible hyperglycemia with current use of oral prednisone. I advised him to check glucose at least once a day and record. He is to call office if glucose is persistently above 200 for more than 2 days. He was advised to maintain adequate oral hydration and a low carb/sugar diet.

## 2016-05-15 NOTE — Progress Notes (Signed)
Pre visit review using our clinic review tool, if applicable. No additional management support is needed unless otherwise documented below in the visit note. 

## 2016-05-15 NOTE — Progress Notes (Signed)
Subjective:  Patient ID: [name], male    DOB: [birth year]  Age: 60 y.o. MRN: [name]  CC: Follow-up (follow up for shoulder pain. )   Shoulder Pain   The pain is present in the left shoulder. This is a new problem. The current episode started 1 to 4 weeks ago. There has been a history of trauma. The problem occurs intermittently. The problem has been gradually improving. The quality of the pain is described as aching. Pertinent negatives include no inability to bear weight, joint locking, joint swelling, limited range of motion or stiffness. Associated symptoms comments: Radiation to left 4th and 5th finger. The symptoms are aggravated by activity. Treatments tried: trigger point injection and flexeril. The treatment provided significant relief. Family history does not include gout or rheumatoid arthritis. His past medical history is significant for diabetes and osteoarthritis. There is no history of gout or rheumatoid arthritis.    4th and 5th Finger Weakness: [name] returns today for reevaluation of left shoulder pain. He reports weakness into fingers since last office visit. He does not remember if symptom was present 1 week ago or not. Symptoms became more noticeable when he tried to grasp of golf club. He denies any neck or shoulder joint or elbow pain. He denies any numbness or tingling. He took first dose of oral prednisone today.  Dm: He does not check glucose at home. He is taking glipizide daily as prescribed.  Outpatient Medications Prior to Visit  Medication Sig Dispense Refill  . budesonide-formoterol (SYMBICORT) 80-4.5 MCG/ACT inhaler Inhale 2 puffs into the lungs 2 (two) times daily. 3 Inhaler 3  . carvedilol (COREG) 25 MG tablet Take 1 tablet (25 mg total) by mouth 2 (two) times daily with a meal. 180 tablet 3  . cyclobenzaprine (FLEXERIL) 10 MG tablet Take 1 tablet (10 mg total) by mouth at bedtime. 14 tablet 0  . dabigatran (PRADAXA) 150 MG CAPS capsule Take  1 capsule (150 mg total) by mouth every 12 (twelve) hours. 180 capsule 3  . furosemide (LASIX) 80 MG tablet Take 0.5-1 tablets (40-80 mg total) by mouth daily as needed (SWELLING). 90 tablet 3  . glipiZIDE (GLUCOTROL) 5 MG tablet Take 1 tablet (5 mg total) by mouth 2 (two) times daily before a meal. 180 tablet 2  . HYDROcodone-acetaminophen (NORCO/VICODIN) 5-325 MG tablet     . mometasone (NASONEX) 50 MCG/ACT nasal spray Place 2 sprays into the nose daily as needed (allergies or rhinitis).     . potassium chloride SA (K-DUR,KLOR-CON) 20 MEQ tablet Take 1 tablet (20 mEq total) by mouth 2 (two) times daily. 180 tablet 2  . predniSONE (STERAPRED UNI-PAK 21 TAB) 10 MG (21) TBPK tablet Take 1 tablet (10 mg total) by mouth as directed. 21 tablet 0  . rosuvastatin (CRESTOR) 10 MG tablet Take 1 tablet (10 mg total) by mouth daily. 90 tablet 2  . sildenafil (VIAGRA) 100 MG tablet Take 1 tablet (100 mg total) by mouth as directed. 15 tablet 3  . spironolactone (ALDACTONE) 25 MG tablet Take 1 tablet (25 mg total) by mouth daily. 90 tablet 3  . valsartan (DIOVAN) 320 MG tablet Take 1 tablet (320 mg total) by mouth daily. 90 tablet 3  . Diclofenac Sodium (PENNSAID) 2 % SOLN Place 1 application onto the skin 2 (two) times daily as needed. (Patient not taking: Reported on 05/15/2016) 2 g 0   No facility-administered medications prior to visit.     ROS See HPI  Objective:  BP 110/68   Pulse 65   Temp 97.5 F (36.4 C)   Ht 6\' 1"  (1.854 m)   Wt (!) 301 lb (136.5 kg)   SpO2 99%   BMI 39.71 kg/m   BP Readings from Last 3 Encounters:  05/15/16 110/68  05/07/16 132/86  09/12/15 128/70    Wt Readings from Last 3 Encounters:  05/15/16 (!) 301 lb (136.5 kg)  05/07/16 (!) 308 lb (139.7 kg)  09/12/15 299 lb 9.6 oz (135.9 kg)    Physical Exam  Constitutional: He is oriented to person, place, and time. No distress.  Cardiovascular:  Pulses:      Radial pulses are 2+ on the right side, and 2+ on the  left side.  Musculoskeletal: Normal range of motion. He exhibits no edema or tenderness.       Right shoulder: Normal.       Left shoulder: Normal.       Left elbow: Normal.       Left wrist: Normal.       Cervical back: Normal.       Left forearm: Normal.       Left hand: Normal sensation noted. Decreased strength noted. He exhibits finger abduction. He exhibits no thumb/finger opposition and no wrist extension trouble.  Decreased strength is noted in the fourth and fifth fingers.  Neurological: He is alert and oriented to person, place, and time. He has normal reflexes.  Skin: Skin is warm and dry.  Vitals reviewed.   Lab Results  Component Value Date   WBC 6.3 08/31/2015   HGB 16.4 08/31/2015   HCT 47.1 08/31/2015   PLT 176.0 08/31/2015   GLUCOSE 349 (H) 08/31/2015   CHOL 166 08/31/2015   TRIG 264.0 (H) 08/31/2015   HDL 39.10 08/31/2015   LDLDIRECT 81.0 08/31/2015   LDLCALC 81 10/09/2014   ALT 38 08/31/2015   AST 24 08/31/2015   NA 133 (L) 08/31/2015   K 4.4 08/31/2015   CL 98 08/31/2015   CREATININE 1.64 (H) 08/31/2015   BUN 32 (H) 08/31/2015   CO2 27 08/31/2015   TSH 3.51 08/31/2015   PSA 4.06 (H) 03/08/2008   INR 1.4 02/06/2010   HGBA1C 9.5 (H) 08/31/2015    Dg Cervical Spine Complete  Result Date: 05/15/2016 CLINICAL DATA:  Posterior left shoulder pain, numbness in the finger with tingling in the arm over the last 2 weeks EXAM: CERVICAL SPINE - COMPLETE 4+ VIEW COMPARISON:  None. FINDINGS: The cervical vertebrae are minimally straightened in alignment. There is mild degenerative disc disease at C4-5 and C5-6. The C6-7 level is not optimally seen. No fracture is noted. No prevertebral soft tissue swelling is present. Oblique views are not optimal but no significant foraminal narrowing is noted. The odontoid process is intact. IMPRESSION: Degenerative disc disease at C4-5 and C5-6. The C6-7 level is not well seen. No acute abnormality. Electronically Signed   By: Ivar Drape M.D.   On: 05/15/2016 12:37    Assessment & Plan:   There are no diagnoses linked to this encounter. I am having [name] maintain his mometasone, budesonide-formoterol, valsartan, spironolactone, furosemide, dabigatran, carvedilol, sildenafil, potassium chloride SA, rosuvastatin, glipiZIDE, HYDROcodone-acetaminophen, Diclofenac Sodium, cyclobenzaprine, and predniSONE.  No orders of the defined types were placed in this encounter.   1) Cervical radiculopathy  2)Left Trapezius strain, subsequent encounter.  I think his symptoms are related to C7 or C8 cervical radiculopathy. He is to complete oral prednisone and follow up  with Dr. Tamala Julian (sports medicine) in 7-10 days. I collaborated with Dr. Tamala Julian, who agrees with above plan.  Follow-up: Return if symptoms worsen or fail to improve.  [name], NP

## 2016-05-21 ENCOUNTER — Telehealth: Payer: Self-pay | Admitting: Internal Medicine

## 2016-05-21 NOTE — Telephone Encounter (Signed)
Pt is calling because he received a letter stating his insurance is no longer pradaxa starting Jun 16 2016. He can have alternatives Eliquis and Xarelto.

## 2016-05-21 NOTE — Telephone Encounter (Signed)
As information.

## 2016-05-23 NOTE — Telephone Encounter (Signed)
Please follow-up with patient regarding eliquis or xarelto as options.

## 2016-05-28 NOTE — Progress Notes (Signed)
[name] [address], [place] [address] Phone: [phone] Subjective:    I'm seeing this patient by the request  of:  [name], MD [name] NP  CC: Shoulder pain and neck pain  [id number]  [name] is a 60 y.o. male coming in with complaint of shoulder neck pain. Did see primary care provider 2 weeks ago. Was having increasing discomfort as well as really didn't give her symptoms. Was found to have C7 and C8 cervical radiculopathy. Patient was also having a left trapezius strain. Patient was given medications including muscle relaxer and prednisone. Patient states worse Recently. Continues to have weakness and now even contest numbness in the left arm. Seems to be the ring and PD finger. Patient hasn't notice that grasping things has been much more difficult. States the pain continues to be mostly in the shoulder buried. Does wake him up at night. Denies any neck pain associated with it.   patient did have x-rays of the cervical spine in November 30. These were independently visualized by me showing moderate arthritic changes from C4 through C7. This is both foraminal narrowing as well as degenerative disc disease  Past Medical History:  Diagnosis Date  . Allergy    SEASONAL  . Asthma   . Atrial fibrillation ([facility])    persistant 02/2009  . Atrial flutter ([place])    s/p CTI ablation 08/06/10  . Cardiac arrhythmia due to congenital heart disease   . Cataract    BEGINING  . Chronic rhinitis   . Chronic systolic dysfunction of left ventricle   . Colon polyp   . Congestive heart failure ([place])   . Diverticulosis    colonoscopy 04/03/2009  . Hyperlipidemia   . Hypertension   . Hypertension   . Morbid obesity ([place])    target weight = 219  for BMI < 30  . Nonischemic cardiomyopathy ([facility])    tachycardia mediated  . Sleep apnea    original 2001   Past Surgical History:  Procedure Laterality Date  . atrial flutter  ablation  08/06/10  . COLONOSCOPY     Social History   Social History  . Marital status: Single    Spouse name: N/A  . Number of children: 0  . Years of education: N/A   Occupational History  . Sales    Social History Main Topics  . Smoking status: Never Smoker  . Smokeless tobacco: Never Used  . Alcohol use 0.0 oz/week     Comment: previously quite heavy, working on cessation   . Drug use: No  . Sexual activity: Not Asked   Other Topics Concern  . None   Social History Narrative   Lives in [place] and works for [name]   No Known Allergies Family History  Problem Relation Age of Onset  . Hypertension Mother   . Kidney failure Mother     Due to sepsis  . Colon cancer Father 65  . COPD Father   . Colon polyps Neg Hx   . Diabetes Neg Hx   . Kidney disease Neg Hx   . Esophageal cancer Neg Hx   . Colon cancer Brother     Past medical history, social, surgical and family history all reviewed in electronic medical record.  No pertanent information unless stated regarding to the chief complaint.   Review of Systems:Review of systems updated and as accurate as of 05/29/16  No headache, visual changes, nausea, vomiting,  diarrhea, constipation, dizziness, abdominal pain, skin rash, fevers, chills, night sweats, weight loss, swollen lymph nodes, body aches, chest pain, shortness of breath, mood changes.   Objective  Blood pressure 128/80, pulse 85, height 6\' 1"  (1.854 m), weight 297 lb (134.7 kg), SpO2 97 %. Systems examined below as of 05/29/16   General: No apparent distress alert and oriented x3 mood and affect normal, dressed appropriately.  HEENT: Pupils equal, extraocular movements intact  Respiratory: Patient's speak in full sentences and does not appear short of breath  Cardiovascular: No lower extremity edema, non tender, no erythema  Skin: Warm dry intact with no signs of infection or rash on extremities or on axial skeleton.  Abdomen: Soft nontender    Neuro: Cranial nerves II through XII are intact, neurovascularly intact in all extremities with 2+ DTRs and 2+ pulses.  Lymph: No lymphadenopathy of posterior or anterior cervical chain or axillae bilaterally.  Gait normal with good balance and coordination.  MSK:  Non tender with full range of motion and good stability and symmetric strength and tone of elbows, wrist, hip, knee and ankles bilaterally.  Neck: Inspection unremarkable. No palpable stepoffs. Positive Spurling's maneuver, radicular symptoms.  Lacks last 10 degrees of extension. Lacks side bending to the left.  Weakness with grip strength. Left side Weakness of C8 distribution. Left side  Numbness of C8 distribution as well as the left side Negative Hoffman sign bilaterally Reflexes normal  Shoulder: left Inspection reveals no abnormalities, atrophy or asymmetry. Palpation is normal with no tenderness over AC joint or bicipital groove. ROM is full in all planes passively. Rotator cuff strength normal throughout. Very minimal impingement signs Speeds and Yergason's tests normal. No labral pathology noted with negative Obrien's, negative clunk and good stability. Normal scapular function observed. No painful arc and no drop arm sign. No apprehension sign Mild discomfort noted in the paraspinal musculature of the trapezius region. Patient's wrist is unremarkable    Impression and Recommendations:     This case required medical decision making of moderate complexity.      Note: This dictation was prepared with Dragon dictation along with smaller phrase technology. Any transcriptional errors that result from this process are unintentional.

## 2016-05-29 ENCOUNTER — Encounter: Payer: Self-pay | Admitting: Family Medicine

## 2016-05-29 ENCOUNTER — Ambulatory Visit (INDEPENDENT_AMBULATORY_CARE_PROVIDER_SITE_OTHER): Payer: Managed Care, Other (non HMO) | Admitting: Family Medicine

## 2016-05-29 VITALS — BP 128/80 | HR 85 | Ht 73.0 in | Wt 297.0 lb

## 2016-05-29 DIAGNOSIS — M5412 Radiculopathy, cervical region: Secondary | ICD-10-CM | POA: Diagnosis not present

## 2016-05-29 MED ORDER — GABAPENTIN 300 MG PO CAPS: ORAL_CAPSULE | ORAL | 3 refills | Status: DC

## 2016-05-29 MED ORDER — TRAMADOL HCL 50 MG PO TABS: 50.0000 mg | ORAL_TABLET | Freq: Three times a day (TID) | ORAL | 0 refills | Status: DC | PRN

## 2016-05-29 MED ORDER — RIVAROXABAN 20 MG PO TABS: 20.0000 mg | ORAL_TABLET | Freq: Every day | ORAL | 3 refills | Status: DC

## 2016-05-29 NOTE — Patient Instructions (Signed)
Good to see you  We will get MRI Gabapentin 300mg  at night Tramadol up to 3 times a day as needed with a tylenol. Try not to take it.  MRI will be calling you  I will call you once I have it and tell you next step

## 2016-05-29 NOTE — Telephone Encounter (Signed)
Called patient and he wants to take the Xarelto 20 mg daily.  [facility]

## 2016-05-29 NOTE — Assessment & Plan Note (Signed)
Worsening.  Significant weakness and numbness. C8 distribution.  Gabapentin j300mg  given. In addition of this we did discuss another round of potential prednisone which patient declined. I do believe because of the weakness and constant numbness patient is needing advance imaging. MRI ordered today. Patient's depending on findings we'll either be a candidate for an epidural versus potential surgical intervention. Patient knows if worsening symptoms to seek medical attention immediately.

## 2016-06-02 ENCOUNTER — Telehealth: Payer: Self-pay

## 2016-06-02 NOTE — Telephone Encounter (Signed)
Prior auth for Xarelto 20mg  [facility]

## 2016-06-11 ENCOUNTER — Telehealth: Payer: Self-pay | Admitting: Internal Medicine

## 2016-06-11 NOTE — Telephone Encounter (Signed)
Pt called in said that he received a letter that ins compy needs more info about the MRI .  Can you contact ins and see what info they are needing inorder for them them to cover this ?

## 2016-06-11 NOTE — Telephone Encounter (Signed)
Spoke to pt, checked with insurance & MRI precert has been approved.

## 2016-06-19 ENCOUNTER — Ambulatory Visit
Admission: RE | Admit: 2016-06-19 | Discharge: 2016-06-19 | Disposition: A | Discharge status: Home / Self Care | Payer: Managed Care, Other (non HMO) | Source: Ambulatory Visit | Attending: Family Medicine | Admitting: Family Medicine

## 2016-06-19 DIAGNOSIS — M5412 Radiculopathy, cervical region: Secondary | ICD-10-CM

## 2016-06-23 NOTE — Progress Notes (Signed)
[name] [name], [name] [address] Phone: [phone] Subjective:    I'm seeing this patient by the request  of:  [name], MD [name] NP  CC: Shoulder pain and neck pain f/u  [id number]  [name] is a 61 y.o. male coming in with complaint of shoulder neck pain. Did see primary care provider 2 weeks ago. Was having increasing discomfort as well as really didn't give her symptoms. Was found to have C7 and C8 cervical radiculopathy.  Patient was having radicular symptoms and continued to have weakness.   patient did have x-rays of the cervical spine in November 30. These were independently visualized by me showing moderate arthritic changes from C4 through C7. This is both foraminal narrowing as well as degenerative disc disease. Due to the weakness patient was sent for an MRI. MRI was inability visualized by me. Patient's MRI shows patient did have a small left paracentral foraminal disc protrusion with moderate left foraminal stenosis. Patient also had multiple other areas disc protrusion.   Patient states Pain seems to be improving as well as having some increasing strength recently. Seems to be making some progress. Patient still though is weak on this side compared to the contralateral side. Still concerning overall. Patient would like to be further along.  Past Medical History:  Diagnosis Date  . Allergy    SEASONAL  . Asthma   . Atrial fibrillation ([facility])    persistant 02/2009  . Atrial flutter ([facility])    s/p CTI ablation 08/06/10  . Cardiac arrhythmia due to congenital heart disease   . Cataract    BEGINING  . Chronic rhinitis   . Chronic systolic dysfunction of left ventricle   . Colon polyp   . Congestive heart failure ([facility])   . Diverticulosis    colonoscopy 04/03/2009  . Hyperlipidemia   . Hypertension   . Hypertension   . Morbid obesity ([facility])    target weight = 219  for BMI < 30  . Nonischemic  cardiomyopathy ([facility])    tachycardia mediated  . Sleep apnea    original 2001   Past Surgical History:  Procedure Laterality Date  . atrial flutter ablation  08/06/10  . COLONOSCOPY     Social History   Social History  . Marital status: Single    Spouse name: N/A  . Number of children: 0  . Years of education: N/A   Occupational History  . Sales    Social History Main Topics  . Smoking status: Never Smoker  . Smokeless tobacco: Never Used  . Alcohol use 0.0 oz/week     Comment: previously quite heavy, working on cessation   . Drug use: No  . Sexual activity: Not Asked   Other Topics Concern  . None   Social History Narrative   Lives in [place] and works for [name]   No Known Allergies Family History  Problem Relation Age of Onset  . Hypertension Mother   . Kidney failure Mother     Due to sepsis  . Colon cancer Father 2  . COPD Father   . Colon polyps Neg Hx   . Diabetes Neg Hx   . Kidney disease Neg Hx   . Esophageal cancer Neg Hx   . Colon cancer Brother     Past medical history, social, surgical and family history all reviewed in electronic medical record.  No pertanent information unless stated regarding to the  chief complaint.   Review of Systems: No headache, visual changes, nausea, vomiting, diarrhea, constipation, dizziness, abdominal pain, skin rash, fevers, chills, night sweats, weight loss, swollen lymph nodes, chest pain, shortness of breath, mood changes.    Objective  Blood pressure 134/86, pulse (!) 110, height 6\' 1"  (1.854 m), weight (!) 303 lb (137.4 kg), SpO2 97 %.  Systems examined below as of 06/24/16 General: NAD A&O x3 mood, affect normal  HEENT: Pupils equal, extraocular movements intact no nystagmus Respiratory: not short of breath at rest or with speaking Cardiovascular: No lower extremity edema, non tender Skin: Warm dry intact with no signs of infection or rash on extremities or on axial skeleton. Abdomen: Soft  nontender, no masses Neuro: Cranial nerves  intact, neurovascularly intact in all extremities with 2+ DTRs and 2+ pulses. Lymph: No lymphadenopathy appreciated today  Gait normal with good balance and coordination.  MSK: Non tender with full range of motion and good stability and symmetric strength and tone of shoulders, elbows, wrist,  knee hips and ankles bilaterally.   Neck: Inspection unremarkable. No palpable stepoffs. Positive Spurling's maneuver, radicular symptoms.  Lacks last 10 degrees of extension. Better side bending then last exam.  Weakness with grip strength. Left side but improved.  Weakness of C8 distribution. Left side but improved as  Well.  Numbness of C8 distribution as well as the left side Negative Hoffman sign bilaterally Reflexes normal      Impression and Recommendations:     This case required medical decision making of moderate complexity.      Note: This dictation was prepared with Dragon dictation along with smaller phrase technology. Any transcriptional errors that result from this process are unintentional.

## 2016-06-24 ENCOUNTER — Ambulatory Visit (INDEPENDENT_AMBULATORY_CARE_PROVIDER_SITE_OTHER): Payer: Managed Care, Other (non HMO) | Admitting: Family Medicine

## 2016-06-24 ENCOUNTER — Encounter: Payer: Self-pay | Admitting: Family Medicine

## 2016-06-24 VITALS — BP 134/86 | HR 110 | Ht 73.0 in | Wt 303.0 lb

## 2016-06-24 DIAGNOSIS — M5412 Radiculopathy, cervical region: Secondary | ICD-10-CM | POA: Diagnosis not present

## 2016-06-24 NOTE — Patient Instructions (Signed)
Good to see you  [name] is your friend.  We will get you an epidural in the neck.  Start the exercises at least 3 times a week Continue the gabapentin  See me again in 2-3 weeks after epidural  Happy New Year!

## 2016-06-24 NOTE — Assessment & Plan Note (Signed)
Patient has been some improvement in the muscle strength at this time but still has weakness compared to the contralateral side. Discussed with patient at great length and patient elected try an epidural steroid injection. I think that this would be very helpful diagnostically and hopefully therapeutically. Patient will try to increase activity as well slowly. Given home exercises and postural exercises. Patient and will follow-up again in 2 weeks after the epidural for further evaluation and treatment.  Spent  25 minutes with patient face-to-face and had greater than 50% of counseling including as described above in assessment and plan.

## 2016-07-10 ENCOUNTER — Telehealth: Payer: Self-pay | Admitting: Pharmacist

## 2016-07-10 NOTE — Telephone Encounter (Signed)
Received request from [name] for clearance to hold Pradaxa for 2 days for cervical ESI. Patient is actually taking Xarelto. Per protocol ok to hold 2 days as requested (recommended 3 day hold for spinal injection) for Afib with CHADS2<4 and no history of stroke.   Faxed back to office at [phone].

## 2016-07-16 ENCOUNTER — Other Ambulatory Visit: Payer: Managed Care, Other (non HMO)

## 2016-07-17 ENCOUNTER — Ambulatory Visit
Admission: RE | Admit: 2016-07-17 | Discharge: 2016-07-17 | Disposition: A | Discharge status: Home / Self Care | Payer: Managed Care, Other (non HMO) | Source: Ambulatory Visit | Attending: Family Medicine | Admitting: Family Medicine

## 2016-07-17 DIAGNOSIS — M5412 Radiculopathy, cervical region: Secondary | ICD-10-CM

## 2016-07-17 MED ORDER — IOPAMIDOL (ISOVUE-M 300) INJECTION 61%
1.0000 mL | Freq: Once | INTRAMUSCULAR | Status: AC | PRN
  Administered 2016-07-17: 1 mL via EPIDURAL

## 2016-07-17 MED ORDER — TRIAMCINOLONE ACETONIDE 40 MG/ML IJ SUSP (RADIOLOGY)
60.0000 mg | Freq: Once | INTRAMUSCULAR | Status: AC
  Administered 2016-07-17: 60 mg via EPIDURAL

## 2016-07-17 NOTE — Progress Notes (Signed)
Patient states his last dose of Xarelto was 07/13/16.  Ralene Bathe

## 2016-07-17 NOTE — Discharge Instructions (Signed)

## 2016-08-01 ENCOUNTER — Encounter: Payer: Self-pay | Admitting: Family Medicine

## 2016-08-01 ENCOUNTER — Ambulatory Visit (INDEPENDENT_AMBULATORY_CARE_PROVIDER_SITE_OTHER): Payer: Managed Care, Other (non HMO) | Admitting: Family Medicine

## 2016-08-01 DIAGNOSIS — M5412 Radiculopathy, cervical region: Secondary | ICD-10-CM | POA: Diagnosis not present

## 2016-08-01 NOTE — Assessment & Plan Note (Signed)
An seems to be doing relatively well overall. We discussed icing regimen and home exercises. We discussed which activities to do a which ones to avoid. I do believe that I would like to keep significant close eye and return in 6 weeks.

## 2016-08-01 NOTE — Progress Notes (Signed)
[name] [address] [place], [place] [address] Phone: [phone] Subjective:    I'm seeing this patient by the request  of:  [name], MD [name] NP  CC: Shoulder pain and neck pain f/u  [id number]  [name] is a 61 y.o. male coming in with complaint of shoulder neck pain. Did see primary care provider 2 weeks ago. Was having increasing discomfort as well as really didn't give her symptoms. Was found to have C7 and C8 cervical radiculopathy.     patient did have x-rays of the cervical spine in November 30. These were independently visualized by me showing moderate arthritic changes from C4 through C7. This is both foraminal narrowing as well as degenerative disc disease. Due to the weakness patient was sent for an MRI. MRI was inability visualized by me. Patient's MRI shows patient did have a small left paracentral foraminal disc protrusion with moderate left foraminal stenosis. Patient also had multiple other areas disc protrusion.   Patient continued to have weakness now. We did send patient for an epidural at C7-T1 on for nearly first 2018. Patient states feeling significantly better. Pain is completely resolved. Weakness is improving as well. States that he is approximate 80% better. Happy with the results of far.  Past Medical History:  Diagnosis Date  . Allergy    SEASONAL  . Asthma   . Atrial fibrillation ([facility])    persistant 02/2009  . Atrial flutter ([facility])    s/p CTI ablation 08/06/10  . Cardiac arrhythmia due to congenital heart disease   . Cataract    BEGINING  . Chronic rhinitis   . Chronic systolic dysfunction of left ventricle   . Colon polyp   . Congestive heart failure ([facility])   . Diverticulosis    colonoscopy 04/03/2009  . Hyperlipidemia   . Hypertension   . Hypertension   . Morbid obesity ([facility])    target weight = 219  for BMI < 30  . Nonischemic cardiomyopathy ([facility])    tachycardia mediated  .  Sleep apnea    original 2001   Past Surgical History:  Procedure Laterality Date  . atrial flutter ablation  08/06/10  . COLONOSCOPY     Social History   Social History  . Marital status: Single    Spouse name: N/A  . Number of children: 0  . Years of education: N/A   Occupational History  . Sales    Social History Main Topics  . Smoking status: Never Smoker  . Smokeless tobacco: Never Used  . Alcohol use 0.0 oz/week     Comment: previously quite heavy, working on cessation   . Drug use: No  . Sexual activity: Not Asked   Other Topics Concern  . None   Social History Narrative   Lives in [place] and works for [name]   No Known Allergies Family History  Problem Relation Age of Onset  . Hypertension Mother   . Kidney failure Mother     Due to sepsis  . Colon cancer Father 51  . COPD Father   . Colon polyps Neg Hx   . Diabetes Neg Hx   . Kidney disease Neg Hx   . Esophageal cancer Neg Hx   . Colon cancer Brother     Past medical history, social, surgical and family history all reviewed in electronic medical record.  No pertanent information unless stated regarding to the chief complaint.   Review of  Systems: No headache, visual changes, nausea, vomiting, diarrhea, constipation, dizziness, abdominal pain, skin rash, fevers, chills, night sweats, weight loss, swollen lymph nodes, body aches, joint swelling, muscle aches, chest pain, shortness of breath, mood changes.    Objective  Blood pressure (!) 122/100, pulse 84, height 6\' 1"  (1.854 m), weight 297 lb (134.7 kg).  Systems examined below as of 08/01/16 General: NAD A&O x3 mood, affect normal  HEENT: Pupils equal, extraocular movements intact no nystagmus Respiratory: not short of breath at rest or with speaking Cardiovascular: No lower extremity edema, non tender Skin: Warm dry intact with no signs of infection or rash on extremities or on axial skeleton. Abdomen: Soft nontender, no masses Neuro:  Cranial nerves  intact, neurovascularly intact in all extremities with 2+ DTRs and 2+ pulses. Lymph: No lymphadenopathy appreciated today  Gait normal with good balance and coordination.  MSK: Non tender with full range of motion and good stability and symmetric strength and tone of shoulders, elbows, wrist,  knee hips and ankles bilaterally.    Neck: Inspection unremarkable. No palpable stepoffs. Negative Spurling's maneuver. Full neck range of motion Strength is 4 out of 5 compared to the contralateral side. Mild improvement from previous exam Strength so show some weakness noted in the C8 distribution compared to previous exam it is improved.  No sensory change to C4 to T1 Negative Hoffman sign bilaterally Reflexes normal        Impression and Recommendations:          Note: This dictation was prepared with Dragon dictation along with smaller phrase technology. Any transcriptional errors that result from this process are unintentional.

## 2016-08-01 NOTE — Patient Instructions (Signed)
Good to see you  6 weeks 

## 2016-08-16 ENCOUNTER — Other Ambulatory Visit: Payer: Self-pay | Admitting: Internal Medicine

## 2016-09-08 ENCOUNTER — Other Ambulatory Visit: Payer: Self-pay | Admitting: Internal Medicine

## 2016-09-09 ENCOUNTER — Ambulatory Visit (INDEPENDENT_AMBULATORY_CARE_PROVIDER_SITE_OTHER): Payer: Managed Care, Other (non HMO) | Admitting: Family Medicine

## 2016-09-09 ENCOUNTER — Other Ambulatory Visit (INDEPENDENT_AMBULATORY_CARE_PROVIDER_SITE_OTHER): Payer: Managed Care, Other (non HMO)

## 2016-09-09 ENCOUNTER — Encounter: Payer: Self-pay | Admitting: Internal Medicine

## 2016-09-09 ENCOUNTER — Ambulatory Visit (INDEPENDENT_AMBULATORY_CARE_PROVIDER_SITE_OTHER): Payer: Managed Care, Other (non HMO) | Admitting: Internal Medicine

## 2016-09-09 ENCOUNTER — Encounter: Payer: Self-pay | Admitting: Family Medicine

## 2016-09-09 VITALS — BP 140/78 | HR 139 | Temp 98.6°F | Resp 16 | Ht 73.0 in | Wt 303.0 lb

## 2016-09-09 DIAGNOSIS — E669 Obesity, unspecified: Secondary | ICD-10-CM | POA: Diagnosis not present

## 2016-09-09 DIAGNOSIS — Z1159 Encounter for screening for other viral diseases: Secondary | ICD-10-CM | POA: Diagnosis not present

## 2016-09-09 DIAGNOSIS — I10 Essential (primary) hypertension: Secondary | ICD-10-CM

## 2016-09-09 DIAGNOSIS — M5412 Radiculopathy, cervical region: Secondary | ICD-10-CM | POA: Diagnosis not present

## 2016-09-09 DIAGNOSIS — E1169 Type 2 diabetes mellitus with other specified complication: Secondary | ICD-10-CM

## 2016-09-09 DIAGNOSIS — E039 Hypothyroidism, unspecified: Secondary | ICD-10-CM | POA: Diagnosis not present

## 2016-09-09 DIAGNOSIS — J4520 Mild intermittent asthma, uncomplicated: Secondary | ICD-10-CM

## 2016-09-09 DIAGNOSIS — E785 Hyperlipidemia, unspecified: Secondary | ICD-10-CM | POA: Diagnosis not present

## 2016-09-09 DIAGNOSIS — Z0000 Encounter for general adult medical examination without abnormal findings: Secondary | ICD-10-CM | POA: Diagnosis not present

## 2016-09-09 LAB — COMPREHENSIVE METABOLIC PANEL
ALBUMIN: 4.3 g/dL (ref 3.5–5.2)
ALK PHOS: 44 U/L (ref 39–117)
ALT: 20 U/L (ref 0–53)
AST: 20 U/L (ref 0–37)
BUN: 26 mg/dL — AB (ref 6–23)
CO2: 30 mEq/L (ref 19–32)
Calcium: 10 mg/dL (ref 8.4–10.5)
Chloride: 106 mEq/L (ref 96–112)
Creatinine, Ser: 1.63 mg/dL — ABNORMAL HIGH (ref 0.40–1.50)
GFR: 45.84 mL/min — ABNORMAL LOW (ref >60.00)
GLUCOSE: 162 mg/dL — AB (ref 70–99)
POTASSIUM: 5.1 meq/L (ref 3.5–5.1)
SODIUM: 141 meq/L (ref 135–145)
TOTAL PROTEIN: 7.4 g/dL (ref 6.0–8.3)
Total Bilirubin: 1 mg/dL (ref 0.2–1.2)

## 2016-09-09 LAB — LIPID PANEL
CHOLESTEROL: 138 mg/dL (ref 0–200)
HDL: 43.3 mg/dL (ref >39.00)
LDL Cholesterol: 63 mg/dL (ref 0–99)
NONHDL: 94.34
Total CHOL/HDL Ratio: 3
Triglycerides: 159 mg/dL — ABNORMAL HIGH (ref 0.0–149.0)
VLDL: 31.8 mg/dL (ref 0.0–40.0)

## 2016-09-09 LAB — CBC
HCT: 52.4 % — ABNORMAL HIGH (ref 39.0–52.0)
HEMOGLOBIN: 17.5 g/dL — AB (ref 13.0–17.0)
MCHC: 33.3 g/dL (ref 30.0–36.0)
MCV: 91.8 fl (ref 78.0–100.0)
Platelets: 192 10*3/uL (ref 150.0–400.0)
RBC: 5.71 Mil/uL (ref 4.22–5.81)
RDW: 14.4 % (ref 11.5–15.5)
WBC: 6 10*3/uL (ref 4.0–10.5)

## 2016-09-09 LAB — HEMOGLOBIN A1C: HEMOGLOBIN A1C: 6.2 % (ref 4.6–6.5)

## 2016-09-09 MED ORDER — GABAPENTIN 100 MG PO CAPS: 200.0000 mg | ORAL_CAPSULE | Freq: Every day | ORAL | 3 refills | Status: DC

## 2016-09-09 NOTE — Patient Instructions (Signed)
We will check that labs today and call you back with the results.    Health Maintenance, Male A healthy lifestyle and preventive care is important for your health and wellness. Ask your health care provider about what schedule of regular examinations is right for you. What should I know about weight and diet?  Eat a Healthy Diet  Eat plenty of vegetables, fruits, whole grains, low-fat dairy products, and lean protein.  Do not eat a lot of foods high in solid fats, added sugars, or salt. Maintain a Healthy Weight  Regular exercise can help you achieve or maintain a healthy weight. You should:  Do at least 150 minutes of exercise each week. The exercise should increase your heart rate and make you sweat (moderate-intensity exercise).  Do strength-training exercises at least twice a week. Watch Your Levels of Cholesterol and Blood Lipids  Have your blood tested for lipids and cholesterol every 5 years starting at 62 years of age. If you are at high risk for heart disease, you should start having your blood tested when you are 62 years old. You may need to have your cholesterol levels checked more often if:  Your lipid or cholesterol levels are high.  You are older than 62 years of age.  You are at high risk for heart disease. What should I know about cancer screening? Many types of cancers can be detected early and may often be prevented. Lung Cancer  You should be screened every year for lung cancer if:  You are a current smoker who has smoked for at least 30 years.  You are a former smoker who has quit within the past 15 years.  Talk to your health care provider about your screening options, when you should start screening, and how often you should be screened. Colorectal Cancer  Routine colorectal cancer screening usually begins at 62 years of age and should be repeated every 5-10 years until you are 62 years old. You may need to be screened more often if early forms of  precancerous polyps or small growths are found. Your health care provider may recommend screening at an earlier age if you have risk factors for colon cancer.  Your health care provider may recommend using home test kits to check for hidden blood in the stool.  A small camera at the end of a tube can be used to examine your colon (sigmoidoscopy or colonoscopy). This checks for the earliest forms of colorectal cancer. Prostate and Testicular Cancer  Depending on your age and overall health, your health care provider may do certain tests to screen for prostate and testicular cancer.  Talk to your health care provider about any symptoms or concerns you have about testicular or prostate cancer. Skin Cancer  Check your skin from head to toe regularly.  Tell your health care provider about any new moles or changes in moles, especially if:  There is a change in a mole's size, shape, or color.  You have a mole that is larger than a pencil eraser.  Always use sunscreen. Apply sunscreen liberally and repeat throughout the day.  Protect yourself by wearing long sleeves, pants, a wide-brimmed hat, and sunglasses when outside. What should I know about heart disease, diabetes, and high blood pressure?  If you are 81-23 years of age, have your blood pressure checked every 3-5 years. If you are 75 years of age or older, have your blood pressure checked every year. You should have your blood pressure measured twice-once  when you are at a hospital or clinic, and once when you are not at a hospital or clinic. Record the average of the two measurements. To check your blood pressure when you are not at a hospital or clinic, you can use:  An automated blood pressure machine at a pharmacy.  A home blood pressure monitor.  Talk to your health care provider about your target blood pressure.  If you are between 61-34 years old, ask your health care provider if you should take aspirin to prevent heart  disease.  Have regular diabetes screenings by checking your fasting blood sugar level.  If you are at a normal weight and have a low risk for diabetes, have this test once every three years after the age of 35.  If you are overweight and have a high risk for diabetes, consider being tested at a younger age or more often.  A one-time screening for abdominal aortic aneurysm (AAA) by ultrasound is recommended for men aged 23-75 years who are current or former smokers. What should I know about preventing infection? Hepatitis B  If you have a higher risk for hepatitis B, you should be screened for this virus. Talk with your health care provider to find out if you are at risk for hepatitis B infection. Hepatitis C  Blood testing is recommended for:  Everyone born from [birth year] through 1966.  Anyone with known risk factors for hepatitis C. Sexually Transmitted Diseases (STDs)  You should be screened each year for STDs including gonorrhea and chlamydia if:  You are sexually active and are younger than 62 years of age.  You are older than 62 years of age and your health care provider tells you that you are at risk for this type of infection.  Your sexual activity has changed since you were last screened and you are at an increased risk for chlamydia or gonorrhea. Ask your health care provider if you are at risk.  Talk with your health care provider about whether you are at high risk of being infected with HIV. Your health care provider may recommend a prescription medicine to help prevent HIV infection. What else can I do?  Schedule regular health, dental, and eye exams.  Stay current with your vaccines (immunizations).  Do not use any tobacco products, such as cigarettes, chewing tobacco, and e-cigarettes. If you need help quitting, ask your health care provider.  Limit alcohol intake to no more than 2 drinks per day. One drink equals 12 ounces of beer, 5 ounces of wine, or 1 ounces of hard  liquor.  Do not use street drugs.  Do not share needles.  Ask your health care provider for help if you need support or information about quitting drugs.  Tell your health care provider if you often feel depressed.  Tell your health care provider if you have ever been abused or do not feel safe at home. This information is not intended to replace advice given to you by your health care provider. Make sure you discuss any questions you have with your health care provider. Document Released: 11/29/2007 Document Revised: 01/30/2016 Document Reviewed: 03/06/2015 Elsevier Interactive Patient Education  2017 [employer].

## 2016-09-09 NOTE — Progress Notes (Signed)
Pre visit review using our clinic review tool, if applicable. No additional management support is needed unless otherwise documented below in the visit note. 

## 2016-09-09 NOTE — Assessment & Plan Note (Signed)
Off thyroid meds and labs normal so will monitor.

## 2016-09-09 NOTE — Assessment & Plan Note (Signed)
Never came for follow up after starting glipizide. Foot exam done and talked to him about the importance of eye exam. Checking HgA1c and adjust as needed. Started glipizide BID without problems. Not complicated as of now.

## 2016-09-09 NOTE — Assessment & Plan Note (Signed)
Colonoscopy up to date, foot exam done, hep c screening done, declines need for hiv screening. Counseled about his new health conditions and sun safety as well as mole surveillance. Given screening recommendations.

## 2016-09-09 NOTE — Assessment & Plan Note (Signed)
BP at goal today on his spironolactone and valsartan and lasix and coreg. Checking CMP and adjust as needed.

## 2016-09-09 NOTE — Assessment & Plan Note (Signed)
Checking lipid panel and adjust as needed. Taking crestor 10 mg daily.

## 2016-09-09 NOTE — Assessment & Plan Note (Signed)
Patient is doing much better at this time. No radicular symptoms. Patient will start to titrate off the gabapentin. Patient fell. Follow-up again only as needed.

## 2016-09-09 NOTE — Assessment & Plan Note (Signed)
BMI 39.9 today but with diabetes and hypertension as complications. Counseled about weight management and exercise.

## 2016-09-09 NOTE — Progress Notes (Signed)
[name] [address] [place], [place] [address] Phone: [phone] Subjective:    I'm seeing this patient by the request  of:  [name], MD [name] NP  CC: Shoulder pain and neck pain f/u  [id number]  [name] is a 61 y.o. male coming in with complaint of shoulder neck pain. Did see primary care provider 2 weeks ago. Was having increasing discomfort as well as really didn't give her symptoms. Was found to have C7 and C8 cervical radiculopathy.     patient did have x-rays of the cervical spine in November 30. These were independently visualized by me showing moderate arthritic changes from C4 through C7. This is both foraminal narrowing as well as degenerative disc disease. Due to the weakness patient was sent for an MRI. MRI was inability visualized by me. Patient's MRI shows patient did have a small left paracentral foraminal disc protrusion with moderate left foraminal stenosis. Patient also had multiple other areas disc protrusion.   Patient continued to have weakness now. We did send patient for an epidural at C7-T1 on for  Feb first 2018. Patient's incision has been doing remarkably well. No weakness at all. It is even able to play golf at this time. Very mild twinges of the muscles in the forearm but states that this only last 1-2 seconds in only happens once or twice a week. Patient overall feels very good and feels like he is back to his baseline.   Past Medical History:  Diagnosis Date  . Allergy    SEASONAL  . Asthma   . Atrial fibrillation ([place])    persistant 02/2009  . Atrial flutter ([place])    s/p CTI ablation 08/06/10  . Cardiac arrhythmia due to congenital heart disease   . Cataract    BEGINING  . Chronic rhinitis   . Chronic systolic dysfunction of left ventricle   . Colon polyp   . Congestive heart failure ([place])   . Diverticulosis    colonoscopy 04/03/2009  . Hyperlipidemia   . Hypertension   .  Hypertension   . Morbid obesity ([place])    target weight = 219  for BMI < 30  . Nonischemic cardiomyopathy ([facility])    tachycardia mediated  . Sleep apnea    original 2001   Past Surgical History:  Procedure Laterality Date  . atrial flutter ablation  08/06/10  . COLONOSCOPY     Social History   Social History  . Marital status: Single    Spouse name: N/A  . Number of children: 0  . Years of education: N/A   Occupational History  . Sales    Social History Main Topics  . Smoking status: Never Smoker  . Smokeless tobacco: Never Used  . Alcohol use 0.0 oz/week     Comment: previously quite heavy, working on cessation   . Drug use: No  . Sexual activity: Not on file   Other Topics Concern  . Not on file   Social History Narrative   Lives in [place] and works for [name]   No Known Allergies Family History  Problem Relation Age of Onset  . Hypertension Mother   . Kidney failure Mother     Due to sepsis  . Colon cancer Father 14  . COPD Father   . Colon polyps Neg Hx   . Diabetes Neg Hx   . Kidney disease Neg Hx   . Esophageal cancer Neg Hx   .  Colon cancer Brother     Past medical history, social, surgical and family history all reviewed in electronic medical record.  No pertanent information unless stated regarding to the chief complaint.   Review of Systems: No headache, visual changes, nausea, vomiting, diarrhea, constipation, dizziness, abdominal pain, skin rash, fevers, chills, night sweats, weight loss, swollen lymph nodes, body aches, joint swelling, muscle aches, chest pain, shortness of breath, mood changes.     Objective  Blood pressure 122/88, pulse 79, height 6\' 1"  (1.854 m), weight (!) 302 lb (137 kg), SpO2 99 %.  Systems examined below as of 09/09/16 General: NAD A&O x3 mood, affect normal  HEENT: Pupils equal, extraocular movements intact no nystagmus Respiratory: not short of breath at rest or with speaking Cardiovascular: No lower  extremity edema, non tender Skin: Warm dry intact with no signs of infection or rash on extremities or on axial skeleton. Abdomen: Soft nontender, no masses Neuro: Cranial nerves  intact, neurovascularly intact in all extremities with 2+ DTRs and 2+ pulses. Lymph: No lymphadenopathy appreciated today  Gait normal with good balance and coordination.  MSK: Non tender with full range of motion and good stability and symmetric strength and tone of shoulders, elbows, wrist,  knee hips and ankles bilaterally.   Neck: Inspection unremarkable. No palpable stepoffs. Negative Spurling's maneuver. Full neck range of motion Grip strength and sensation normal in bilateral hands Strength good C4 to T1 distribution No sensory change to C4 to T1 Negative Hoffman sign bilaterally Reflexes normal        Impression and Recommendations:          Note: This dictation was prepared with Dragon dictation along with smaller phrase technology. Any transcriptional errors that result from this process are unintentional.

## 2016-09-09 NOTE — Progress Notes (Signed)
   Subjective:    Patient ID: [name], male    DOB: Oct 22, 1955, 61 y.o.   MRN: [id number]  HPI  The patient is a 61 YO man coming in for wellness. Never followed up on new diagnosis of diabetes after last visit and has gotten refills for the last year without labs. Is having some mild burning pain in the lateral right thigh which could be related.   PMH, [facility], social history reviewed and updated.   Review of Systems  Constitutional: Negative.   HENT: Negative.   Eyes: Negative.   Respiratory: Negative for cough, chest tightness and shortness of breath.   Cardiovascular: Negative for chest pain, palpitations and leg swelling.  Gastrointestinal: Negative for abdominal distention, abdominal pain, constipation, diarrhea, nausea and vomiting.  Musculoskeletal: Negative.   Skin: Negative.   Neurological: Positive for numbness. Negative for dizziness, tremors, syncope, facial asymmetry, speech difficulty, weakness and headaches.  Psychiatric/Behavioral: Negative.       Objective:   Physical Exam  Constitutional: He is oriented to person, place, and time. He appears well-developed and well-nourished.  HENT:  Head: Normocephalic and atraumatic.  Eyes: EOM are normal.  Neck: Normal range of motion.  Cardiovascular: Normal rate and regular rhythm.   Pulmonary/Chest: Effort normal and breath sounds normal. No respiratory distress. He has no wheezes. He has no rales.  Abdominal: Soft. Bowel sounds are normal. He exhibits no distension. There is no tenderness. There is no rebound.  Musculoskeletal: He exhibits no edema.  Neurological: He is alert and oriented to person, place, and time. Coordination normal.  Skin: Skin is warm and dry.  Foot exam done  Psychiatric: He has a normal mood and affect.   Vitals:   09/09/16 0946  BP: 140/78  Pulse: (!) 139  Resp: 16  Temp: 98.6 F (37 C)  TempSrc: Oral  SpO2: 99%  Weight: (!) 303 lb (137.4 kg)  Height: 6\' 1"  (1.854 m)        Assessment & Plan:

## 2016-09-09 NOTE — Assessment & Plan Note (Signed)
Mild intermittent, uses symbicort prn as needed and not taken lately.

## 2016-09-10 LAB — HEPATITIS C ANTIBODY: HCV Ab: NEGATIVE

## 2016-09-15 ENCOUNTER — Other Ambulatory Visit: Payer: Self-pay | Admitting: Family Medicine

## 2016-09-26 ENCOUNTER — Other Ambulatory Visit: Payer: Self-pay | Admitting: Internal Medicine

## 2016-09-26 ENCOUNTER — Other Ambulatory Visit: Payer: Self-pay | Admitting: *Deleted

## 2016-09-26 MED ORDER — CARVEDILOL 25 MG PO TABS: 25.0000 mg | ORAL_TABLET | Freq: Two times a day (BID) | ORAL | 0 refills | Status: DC

## 2016-10-01 ENCOUNTER — Other Ambulatory Visit: Payer: Self-pay | Admitting: Internal Medicine

## 2016-10-06 ENCOUNTER — Other Ambulatory Visit: Payer: Self-pay | Admitting: Internal Medicine

## 2016-10-10 ENCOUNTER — Other Ambulatory Visit: Payer: Self-pay | Admitting: Internal Medicine

## 2016-10-29 ENCOUNTER — Other Ambulatory Visit: Payer: Self-pay | Admitting: Internal Medicine

## 2016-10-30 LAB — HM DIABETES EYE EXAM

## 2016-11-14 ENCOUNTER — Other Ambulatory Visit: Payer: Self-pay | Admitting: Internal Medicine

## 2016-12-01 ENCOUNTER — Other Ambulatory Visit: Payer: Self-pay | Admitting: Internal Medicine

## 2016-12-08 ENCOUNTER — Other Ambulatory Visit: Payer: Self-pay | Admitting: Internal Medicine

## 2016-12-08 ENCOUNTER — Telehealth: Payer: Self-pay | Admitting: Internal Medicine

## 2016-12-08 NOTE — Telephone Encounter (Signed)
Pt on Xarelto (per our records pt on Xarelto) for Afib with CHADS <4 and no history of stroke. Per protocol ok to hold 24 hours prior to procedure. Clearance faxed to number provided.

## 2016-12-08 NOTE — Telephone Encounter (Signed)
New message    1. What dental office are you calling from? Dr. Romie Minus office  2. What is your office phone and fax number? Fax-[phone] [phone]  3. What type of procedure is the patient having performed? Biopsy   4. What date is procedure scheduled? Not schedule   5. What is your question (ex. Antibiotics prior to procedure, holding medication-we need to know how long dentist wants pt to hold med)? How long should pt hold pradaxa before oral surgery.

## 2016-12-23 ENCOUNTER — Other Ambulatory Visit: Payer: Self-pay | Admitting: Internal Medicine

## 2017-01-19 ENCOUNTER — Other Ambulatory Visit: Payer: Self-pay | Admitting: Family Medicine

## 2017-01-20 ENCOUNTER — Telehealth: Payer: Self-pay | Admitting: Internal Medicine

## 2017-01-20 NOTE — Telephone Encounter (Signed)
Patient with diagnosis of Afib on Xarelto for anticoagulation.    Procedure: Biopsy Date of procedure: 01/22/17  CHADS2 score of 2 (CHF, HTN);  CrCl 91 mL/min  Per office protocol, patient can hold Xarelto for 24 hours prior to procedure.    Patient should restart Xarelto day after procedure, at discretion of procedure MD.

## 2017-01-20 NOTE — Telephone Encounter (Signed)
New message       [facility] Pre-operative Risk Assessment    Request for surgical clearance:  1. What type of surgery is being performed? Biopsy    2. When is this surgery scheduled? Thursday  01/22/17  3. Are there any medications that need to be held prior to surgery and how long? Pradaxa   4. Name of physician performing surgery? Dr. Romie Minus   5. What is your office phone and fax number? Fax-[phone] [place] 01/20/2017, 8:09 AM  _________________________________________________________________   (provider comments below)

## 2017-02-12 ENCOUNTER — Other Ambulatory Visit: Payer: Self-pay | Admitting: Internal Medicine

## 2017-03-16 NOTE — Progress Notes (Signed)
Cardiology Office Note Date:  03/18/2017  Patient ID:  [name], [name] February 01, 1956, MRN [id number] PCP:  [name], MD  Electrophysiologist; Dr. Rayann Heman   Chief Complaint: annual visit  History of Present Illness: [name] is a 61 y.o. male with history of persistent Afib s/p ablation 2012 with Dr. Rayann Heman, chronic CHF (systolic), HTN, OSA w/CPAP, HLD,  He comes in today to be seen for Dr. Rayann Heman, last seen by him march 2017, at that time planned for an echo in a year, noted to be doing well, no changes were made to his tx.  The patient is feeling very well.  He mentions missed spring follow , busy and kept forgetting to get scheduled.  He denies any kind of symptoms.  He does not exercise formally but golfs 2-3 days week without noted changes to his exertional capacity.  He uses his CPAP nightly and denies any nighttime symptoms of PND o orthopnea, no DOE or rest SOB.  He denies any kind of heart awareness, no palpitations or CP.    He is very surprised today to hear he is in AF, remarks how great he feels.  AFib Hx Diagnosed 2010 w/CHF AAD amiodarone early in diagnosis, records indicate symptomatic QT prolongation off post ablation Patient reports 2 DCCV prior to ablation procedure AFib ablation Dr. Rayann Heman 08/06/10  Past Medical History:  Diagnosis Date  . Allergy    SEASONAL  . Asthma   . Atrial fibrillation ([place])    persistant 02/2009  . Atrial flutter ([place])    s/p CTI ablation 08/06/10  . Cardiac arrhythmia due to congenital heart disease   . Cataract    BEGINING  . Chronic rhinitis   . Chronic systolic dysfunction of left ventricle   . Colon polyp   . Congestive heart failure ([place])   . Diverticulosis    colonoscopy 04/03/2009  . Hyperlipidemia   . Hypertension   . Hypertension   . Morbid obesity ([place])    target weight = 219  for BMI < 30  . Nonischemic cardiomyopathy ([facility])    tachycardia mediated  . Sleep apnea    original 2001    Past  Surgical History:  Procedure Laterality Date  . atrial flutter ablation  08/06/10  . COLONOSCOPY      Current Outpatient Prescriptions  Medication Sig Dispense Refill  . carvedilol (COREG) 25 MG tablet TAKE 1 TABLET BY MOUTH TWICE A DAY WITH A MEAL. **NEEDS APPOINTMENT FOR REFILLS** 15 tablet 0  . furosemide (LASIX) 80 MG tablet Take 0.5-1 tablets (40-80 mg total) by mouth daily as needed for fluid or edema. 15 tablet 0  . glipiZIDE (GLUCOTROL) 5 MG tablet TAKE 1 TABLET TWICE A DAY BEFORE MEALS 180 tablet 3  . mometasone (NASONEX) 50 MCG/ACT nasal spray Place 2 sprays into the nose daily as needed (allergies or rhinitis).     . potassium chloride SA (K-DUR,KLOR-CON) 20 MEQ tablet Take 1 tablet (20 mEq total) by mouth 2 (two) times daily. NEEDS APPT FOR FURTHER REFILLS, CALL [phone] TO SCHEDULE, 1ST ATTEMPT 60 tablet 0  . rivaroxaban (XARELTO) 20 MG TABS tablet Take 1 tablet (20 mg total) by mouth daily with supper. 90 tablet 3  . rosuvastatin (CRESTOR) 10 MG tablet TAKE 1 TABLET DAILY (PLEASE CALL OUR OFFICE TO SCHEDULE AN OVERDUE YEARLY APPOINTMENT BEFORE ANYMORE REFILLS [phone], 1ST ATTEMPT) 15 tablet 0  . sildenafil (VIAGRA) 100 MG tablet Take 1 tablet (100 mg total) by mouth as  directed. 15 tablet 3  . spironolactone (ALDACTONE) 25 MG tablet TAKE 1 TABLET DAILY (PLEASE CALL [phone] TO SCHEDULE APPOINTMENT, NO OTHER REFILLS WILL BE GIVEN UNTIL COMPLETED) 15 tablet 0  . SYMBICORT 80-4.5 MCG/ACT inhaler USE 2 INHALATIONS TWICE A DAY 30.6 g 1  . valsartan (DIOVAN) 320 MG tablet TAKE 1 TABLET DAILY (NEED APPOINTMENT FOR FURTHER REFILLS) 15 tablet 0   No current facility-administered medications for this visit.     Allergies:   Patient has no known allergies.   Social History:  The patient  reports that he has never smoked. He has never used smokeless tobacco. He reports that he drinks alcohol. He reports that he does not use drugs.   Family History:  The patient's family  history includes COPD in his father; Colon cancer in his brother; Colon cancer (age of onset: 60) in his father; Hypertension in his mother; Kidney failure in his mother.  ROS:  Please see the history of present illness.  All other systems are reviewed and otherwise negative.   PHYSICAL EXAM:  VS:  BP 113/79   Pulse 90   Ht 6\' 1"  (1.854 m)   Wt (!) 301 lb (136.5 kg)   BMI 39.71 kg/m  BMI: Body mass index is 39.71 kg/m. Well nourished, well developed, in no acute distress  HEENT: normocephalic, atraumatic  Neck: no JVD, carotid bruits or masses Cardiac:  IRRR; no significant murmurs, no rubs, or gallops Lungs:  CTA b/l, no wheezing, rhonchi or rales  Abd: soft, nontender, obese MS: no deformity or atrophy Ext: varicosities noted b/l, chronic skin changes with trace edema  Skin: warm and dry, no rash Neuro:  No gross deficits appreciated Psych: euthymic mood, full affect    EKG:  Done today and reviewed by myself shows AFin, 90bpm, RBBB, LAD  10/09/14: TTE Study Conclusions - Left ventricle: The cavity size was normal. Systolic function was   normal. The estimated ejection fraction was in the range of 60%   to 65%. Wall motion was normal; there were no regional wall   motion abnormalities. Features are consistent with a pseudonormal   left ventricular filling pattern, with concomitant abnormal   relaxation and increased filling pressure (grade 2 diastolic   dysfunction). - Left atrium: The atrium was severely dilated. (33mm)  Recent Labs: 09/09/2016: ALT 20; BUN 26; Creatinine, Ser 1.63; Hemoglobin 17.5; Platelets 192.0; Potassium 5.1; Sodium 141  09/09/2016: Cholesterol 138; HDL 43.30; LDL Cholesterol 63; Total CHOL/HDL Ratio 3; Triglycerides 159.0; VLDL 31.8   CrCl cannot be calculated (Patient's most recent lab result is older than the maximum 21 days allowed.).   Wt Readings from Last 3 Encounters:  03/18/17 (!) 301 lb (136.5 kg)  09/09/16 (!) 302 lb (137 kg)  09/09/16  (!) 303 lb (137.4 kg)     Other studies reviewed: Additional studies/records reviewed today include: summarized above  ASSESSMENT AND PLAN:  1. Persistent Afib     CHA2DS2Vasc is at least 2, on pradaxa 159mg  BID currently, he had supply at home and has not made the switch to Xarelto yet     He reports compliance with his a/c     Last Creat in march was 1.63, with his current weight clearance 91  He is unaware of his AF today, states a couple times today how surprised he is to hear he is out of rhythm Discussed AF triggers, importance of exercise and weight loss, avoidance of triggers, stimulants, he drinks 1-2 beers "hear and there"  He reports CPAP compliance, no smoking In 2014 his LA was 38mm  2. Chronic CHF (systolic)     LVEF normalized by last echo     No exam findings or symptoms to suggest fluid OL or change in EF     On BB/ARB, lasix/K+  3. HTN     Looks good, no changes  The patient's insurance is about to change, he is completely asymptomatic and for now, he would like to hold off until November once he gets on the new insurance to start up testing, further evaluation.   Today will get BMET, CBC, and TSH He will make the switch to Xarelto without interruption and we discussed this. WIll plan for echo to re-evaluate his EF, last LA sixed was severely enlarged and an EM to try and establish his AF burden as well in November There is mention of some QT prolongation with amiodarone historically.    Disposition: F/u with testing as discussed in November once he is on his new insurance and OV in December, sooner if needed  Current medicines are reviewed at length with the patient today.  The patient did not have any concerns regarding medicines.  Venetia Night, PA-C 03/18/2017 8:58 AM     [address] [place] [address] [phone] (office)  [phone] (fax)

## 2017-03-18 ENCOUNTER — Ambulatory Visit (INDEPENDENT_AMBULATORY_CARE_PROVIDER_SITE_OTHER): Payer: Managed Care, Other (non HMO) | Admitting: Physician Assistant

## 2017-03-18 ENCOUNTER — Encounter (INDEPENDENT_AMBULATORY_CARE_PROVIDER_SITE_OTHER): Payer: Self-pay

## 2017-03-18 VITALS — BP 113/79 | HR 90 | Ht 73.0 in | Wt 301.0 lb

## 2017-03-18 DIAGNOSIS — I481 Persistent atrial fibrillation: Secondary | ICD-10-CM

## 2017-03-18 DIAGNOSIS — Z79899 Other long term (current) drug therapy: Secondary | ICD-10-CM | POA: Diagnosis not present

## 2017-03-18 DIAGNOSIS — I4510 Unspecified right bundle-branch block: Secondary | ICD-10-CM

## 2017-03-18 DIAGNOSIS — I10 Essential (primary) hypertension: Secondary | ICD-10-CM

## 2017-03-18 DIAGNOSIS — I428 Other cardiomyopathies: Secondary | ICD-10-CM

## 2017-03-18 DIAGNOSIS — I4819 Other persistent atrial fibrillation: Secondary | ICD-10-CM

## 2017-03-18 LAB — BASIC METABOLIC PANEL
BUN/Creatinine Ratio: 18 (ref 10–24)
BUN: 33 mg/dL — AB (ref 8–27)
CALCIUM: 9.3 mg/dL (ref 8.6–10.2)
CO2: 19 mmol/L — AB (ref 20–29)
CREATININE: 1.85 mg/dL — AB (ref 0.76–1.27)
Chloride: 102 mmol/L (ref 96–106)
GFR, EST AFRICAN AMERICAN: 44 mL/min/{1.73_m2} — AB (ref >59)
GFR, EST NON AFRICAN AMERICAN: 38 mL/min/{1.73_m2} — AB (ref >59)
Glucose: 127 mg/dL — ABNORMAL HIGH (ref 65–99)
POTASSIUM: 4.9 mmol/L (ref 3.5–5.2)
Sodium: 137 mmol/L (ref 134–144)

## 2017-03-18 LAB — CBC
Hematocrit: 50 % (ref 37.5–51.0)
Hemoglobin: 17.4 g/dL (ref 13.0–17.7)
MCH: 31.4 pg (ref 26.6–33.0)
MCHC: 34.8 g/dL (ref 31.5–35.7)
MCV: 90 fL (ref 79–97)
Platelets: 158 10*3/uL (ref 150–379)
RBC: 5.55 x10E6/uL (ref 4.14–5.80)
RDW: 13.5 % (ref 12.3–15.4)
WBC: 6.3 10*3/uL (ref 3.4–10.8)

## 2017-03-18 LAB — TSH: TSH: 3.51 u[IU]/mL (ref 0.450–4.500)

## 2017-03-18 MED ORDER — SPIRONOLACTONE 25 MG PO TABS: 25.0000 mg | ORAL_TABLET | Freq: Every day | ORAL | 3 refills | Status: DC

## 2017-03-18 MED ORDER — RIVAROXABAN 20 MG PO TABS: 20.0000 mg | ORAL_TABLET | Freq: Every day | ORAL | 3 refills | Status: DC

## 2017-03-18 MED ORDER — VALSARTAN 320 MG PO TABS: 320.0000 mg | ORAL_TABLET | Freq: Every day | ORAL | 3 refills | Status: DC

## 2017-03-18 MED ORDER — FUROSEMIDE 80 MG PO TABS: ORAL_TABLET | ORAL | 3 refills | Status: DC

## 2017-03-18 MED ORDER — POTASSIUM CHLORIDE CRYS ER 20 MEQ PO TBCR: 20.0000 meq | EXTENDED_RELEASE_TABLET | Freq: Two times a day (BID) | ORAL | 3 refills | Status: DC

## 2017-03-18 MED ORDER — ROSUVASTATIN CALCIUM 10 MG PO TABS: 10.0000 mg | ORAL_TABLET | Freq: Every day | ORAL | 3 refills | Status: DC

## 2017-03-18 MED ORDER — CARVEDILOL 25 MG PO TABS: 25.0000 mg | ORAL_TABLET | Freq: Two times a day (BID) | ORAL | 3 refills | Status: DC

## 2017-03-18 NOTE — Patient Instructions (Signed)
Medication Instructions:   Your physician recommends that you continue on your current medications as directed. Please refer to the Current Medication list given to you today.   If you need a refill on your cardiac medications before your next appointment, please call your pharmacy.  Labwork:  TODAY BMET CBC AND TSH    Testing/Procedures:  AT THE END OF NOV.[name] KitchenMarland KitchenMarland KitchenYour physician has requested that you have an echocardiogram. Echocardiography is a painless test that uses sound waves to create images of your heart. It provides your doctor with information about the size and shape of your heart and how well your heart's chambers and valves are working. This procedure takes approximately one hour. There are no restrictions for this procedure.   AT THE END OF NOV.[name] KitchenMarland KitchenMarland KitchenYour physician has recommended that you wear an event monitor. Event monitors are medical devices that record the heart's electrical activity. Doctors most often [place] these monitors to diagnose arrhythmias. Arrhythmias are problems with the speed or rhythm of the heartbeat. The monitor is a small, portable device. You can wear one while you do your normal daily activities. This is usually used to diagnose what is causing palpitations/syncope (passing out).   Follow-Up:  WITH ALLRED OR URSUY IN DECEMBER   Any Other Special Instructions Will Be Listed Below (If Applicable).

## 2017-03-25 ENCOUNTER — Other Ambulatory Visit: Payer: Self-pay | Admitting: *Deleted

## 2017-03-25 DIAGNOSIS — Z79899 Other long term (current) drug therapy: Secondary | ICD-10-CM

## 2017-03-25 DIAGNOSIS — I5022 Chronic systolic (congestive) heart failure: Secondary | ICD-10-CM

## 2017-03-25 MED ORDER — LOSARTAN POTASSIUM 50 MG PO TABS: 50.0000 mg | ORAL_TABLET | Freq: Every day | ORAL | 0 refills | Status: DC

## 2017-04-02 ENCOUNTER — Other Ambulatory Visit: Payer: Self-pay

## 2017-04-02 ENCOUNTER — Ambulatory Visit (HOSPITAL_COMMUNITY): Payer: Managed Care, Other (non HMO) | Attending: Cardiovascular Disease

## 2017-04-02 DIAGNOSIS — I361 Nonrheumatic tricuspid (valve) insufficiency: Secondary | ICD-10-CM | POA: Insufficient documentation

## 2017-04-02 DIAGNOSIS — Z79899 Other long term (current) drug therapy: Secondary | ICD-10-CM | POA: Diagnosis present

## 2017-04-02 DIAGNOSIS — I4819 Other persistent atrial fibrillation: Secondary | ICD-10-CM

## 2017-04-02 DIAGNOSIS — I481 Persistent atrial fibrillation: Secondary | ICD-10-CM | POA: Diagnosis not present

## 2017-04-02 MED ORDER — PERFLUTREN LIPID MICROSPHERE
1.0000 mL | INTRAVENOUS | Status: AC | PRN
  Administered 2017-04-02: 2 mL via INTRAVENOUS

## 2017-04-10 ENCOUNTER — Telehealth: Payer: Self-pay | Admitting: Physician Assistant

## 2017-04-10 ENCOUNTER — Telehealth: Payer: Self-pay | Admitting: *Deleted

## 2017-04-10 NOTE — Telephone Encounter (Signed)
New message     Pt is returning call to [facility].

## 2017-04-10 NOTE — Telephone Encounter (Signed)
LMOVM  TO CALL BACK ABOUT ECHO RESULTS AND INQUIRE ABOUT EVENT MONITOR BEING SCHEDULED

## 2017-04-10 NOTE — Telephone Encounter (Signed)
-----   Message from [place], [place] sent at 04/02/2017  7:45 PM EDT ----- Please let the patient know his heart muscle strength is normal, no heart murmurs of concern.  Is he schedule for an event monitor?  Thanks renee

## 2017-04-21 ENCOUNTER — Telehealth: Payer: Self-pay | Admitting: Internal Medicine

## 2017-04-21 MED ORDER — LOSARTAN POTASSIUM 50 MG PO TABS: 50.0000 mg | ORAL_TABLET | Freq: Every day | ORAL | 3 refills | Status: DC

## 2017-04-21 NOTE — Telephone Encounter (Signed)
Pt's medication was sent to pt's pharmacy as requested. Confirmation received.  °

## 2017-04-21 NOTE — Telephone Encounter (Signed)
New message     *STAT* If patient is at the pharmacy, call can be transferred to refill team.   1. Which medications need to be refilled? (please list name of each medication and dose if known) losartan (COZAAR) 50 MG tablet  2. Which pharmacy/location (including street and city if local pharmacy) is medication to be sent to? [facility] [address] [place]   3. Do they need a 30 day or 90 day supply? [place]

## 2017-04-24 ENCOUNTER — Other Ambulatory Visit: Payer: BLUE CROSS/BLUE SHIELD | Admitting: *Deleted

## 2017-04-24 DIAGNOSIS — Z79899 Other long term (current) drug therapy: Secondary | ICD-10-CM | POA: Diagnosis not present

## 2017-04-24 DIAGNOSIS — I5022 Chronic systolic (congestive) heart failure: Secondary | ICD-10-CM | POA: Diagnosis not present

## 2017-04-24 LAB — BASIC METABOLIC PANEL
BUN/Creatinine Ratio: 12 (ref 10–24)
BUN: 18 mg/dL (ref 8–27)
CALCIUM: 9.4 mg/dL (ref 8.6–10.2)
CO2: 25 mmol/L (ref 20–29)
CREATININE: 1.53 mg/dL — AB (ref 0.76–1.27)
Chloride: 102 mmol/L (ref 96–106)
GFR, EST AFRICAN AMERICAN: 56 mL/min/{1.73_m2} — AB (ref >59)
GFR, EST NON AFRICAN AMERICAN: 48 mL/min/{1.73_m2} — AB (ref >59)
Glucose: 163 mg/dL — ABNORMAL HIGH (ref 65–99)
POTASSIUM: 5.5 mmol/L — AB (ref 3.5–5.2)
Sodium: 138 mmol/L (ref 134–144)

## 2017-04-28 ENCOUNTER — Telehealth: Payer: Self-pay | Admitting: *Deleted

## 2017-04-28 ENCOUNTER — Other Ambulatory Visit: Payer: Self-pay | Admitting: *Deleted

## 2017-04-28 DIAGNOSIS — E875 Hyperkalemia: Secondary | ICD-10-CM

## 2017-04-28 NOTE — Telephone Encounter (Signed)
-----   Message from [place], [place] sent at 04/27/2017  4:56 PM EST ----- Lyndel Safe function looks back towards his baseline, potassium is high, hold for 2 days and then reduce potassium dose to every other day Potassium level in 1 week please Renee

## 2017-04-28 NOTE — Telephone Encounter (Signed)
SPOKE TO PT ABOUT  RESULTS PT IS AWARE OF HOLDING K+ FOR TWO DAYS THEN RESUME EVERY OTHER DAY   PT WILL RETURN FOR LAB WORK ON 05-13-17

## 2017-05-05 ENCOUNTER — Other Ambulatory Visit: Payer: Self-pay | Admitting: Physician Assistant

## 2017-05-05 MED ORDER — FUROSEMIDE 80 MG PO TABS: ORAL_TABLET | ORAL | 3 refills | Status: DC

## 2017-05-12 DIAGNOSIS — Z23 Encounter for immunization: Secondary | ICD-10-CM | POA: Diagnosis not present

## 2017-05-13 ENCOUNTER — Other Ambulatory Visit (HOSPITAL_COMMUNITY): Payer: Managed Care, Other (non HMO)

## 2017-05-13 ENCOUNTER — Encounter: Payer: Self-pay | Admitting: Internal Medicine

## 2017-05-13 ENCOUNTER — Other Ambulatory Visit: Payer: BLUE CROSS/BLUE SHIELD

## 2017-05-13 ENCOUNTER — Other Ambulatory Visit: Payer: Self-pay | Admitting: Internal Medicine

## 2017-05-13 NOTE — Telephone Encounter (Signed)
Pt last saw Tommye Standard, [place] on 03/18/17, last labs 04/24/17 Creat 1.53, weight 136.5kg, age 61, CrCl 96.65, based on CrCl pt is on appropriate dosage of Xarelto 20mg  QD  Will refill rx

## 2017-05-20 ENCOUNTER — Ambulatory Visit: Payer: Managed Care, Other (non HMO) | Admitting: Internal Medicine

## 2017-06-02 ENCOUNTER — Telehealth: Payer: Self-pay | Admitting: Physician Assistant

## 2017-06-02 NOTE — Telephone Encounter (Signed)
Spoke with the patient, he reports he is taking the reduced K+ (every other day), but can not come in for lab draw, told him was important we know where his potassium level is, he was firm however that he can not come for any additional labs draws and will keep his appointment with Dr. Rayann Heman next month for the labs to be done then.  He reports work is very busy and does not have the free time.  He is feeling well, without complaints.  I asked that if he finds an availability to give the office a call to come in for labs.  Tommye Standard, PA-C

## 2017-06-29 ENCOUNTER — Ambulatory Visit: Payer: Managed Care, Other (non HMO) | Admitting: Internal Medicine

## 2017-07-03 ENCOUNTER — Encounter: Payer: Self-pay | Admitting: Internal Medicine

## 2017-07-03 ENCOUNTER — Ambulatory Visit: Payer: BLUE CROSS/BLUE SHIELD | Admitting: Internal Medicine

## 2017-07-03 VITALS — BP 120/80 | HR 64 | Ht 73.0 in | Wt 308.2 lb

## 2017-07-03 DIAGNOSIS — G4733 Obstructive sleep apnea (adult) (pediatric): Secondary | ICD-10-CM

## 2017-07-03 DIAGNOSIS — I481 Persistent atrial fibrillation: Secondary | ICD-10-CM

## 2017-07-03 DIAGNOSIS — I4819 Other persistent atrial fibrillation: Secondary | ICD-10-CM

## 2017-07-03 DIAGNOSIS — I10 Essential (primary) hypertension: Secondary | ICD-10-CM | POA: Diagnosis not present

## 2017-07-03 MED ORDER — LOSARTAN POTASSIUM 50 MG PO TABS: 50.0000 mg | ORAL_TABLET | Freq: Every day | ORAL | 3 refills | Status: DC

## 2017-07-03 MED ORDER — ROSUVASTATIN CALCIUM 10 MG PO TABS: 10.0000 mg | ORAL_TABLET | Freq: Every day | ORAL | 3 refills | Status: DC

## 2017-07-03 MED ORDER — RIVAROXABAN 20 MG PO TABS: 20.0000 mg | ORAL_TABLET | Freq: Every day | ORAL | 2 refills | Status: DC

## 2017-07-03 NOTE — Patient Instructions (Addendum)
Medication Instructions:  Your physician recommends that you continue on your current medications as directed. Please refer to the Current Medication list given to you today.   Labwork: None ordered   Testing/Procedures: None ordered   Follow-Up: Your physician recommends that you schedule a follow-up appointment in: 4 months with Dr. Rayann Heman   Any Other Special Instructions Will Be Listed Below (If Applicable).     If you need a refill on your cardiac medications before your next appointment, please call your pharmacy.

## 2017-07-03 NOTE — Progress Notes (Signed)
PCP: [name], MD   Primary EP: Dr Rayann Heman  [name] is a 62 y.o. male who presents today for routine electrophysiology followup.  Since last being seen in our clinic, the patient reports doing reasonably well. Not very active.  Unaware of his afib.  Today, he denies symptoms of palpitations, chest pain, shortness of breath,  lower extremity edema, dizziness, presyncope, or syncope.  The patient is otherwise without complaint today.   Past Medical History:  Diagnosis Date  . Allergy    SEASONAL  . Asthma   . Atrial fibrillation ([place])    persistant 02/2009  . Atrial flutter ([place])    s/p CTI ablation 08/06/10  . Cardiac arrhythmia due to congenital heart disease   . Cataract    BEGINING  . Chronic rhinitis   . Chronic systolic dysfunction of left ventricle   . Colon polyp   . Congestive heart failure ([place])   . Diverticulosis    colonoscopy 04/03/2009  . Hyperlipidemia   . Hypertension   . Hypertension   . Morbid obesity ([place])    target weight = 219  for BMI < 30  . Nonischemic cardiomyopathy ([facility])    tachycardia mediated  . Sleep apnea    original 2001   Past Surgical History:  Procedure Laterality Date  . atrial flutter ablation  08/06/10  . COLONOSCOPY      ROS- all systems are reviewed and negatives except as per HPI above  Current Outpatient Medications  Medication Sig Dispense Refill  . carvedilol (COREG) 25 MG tablet Take 1 tablet (25 mg total) by mouth 2 (two) times daily with a meal. 60 tablet 3  . furosemide (LASIX) 80 MG tablet Take 0.5-1 tablets (40-80 mg total) by mouth daily as needed for fluid or edema. 90 tablet 3  . glipiZIDE (GLUCOTROL) 5 MG tablet TAKE 1 TABLET TWICE A DAY BEFORE MEALS 180 tablet 3  . losartan (COZAAR) 50 MG tablet Take 1 tablet (50 mg total) daily by mouth. 90 tablet 3  . mometasone (NASONEX) 50 MCG/ACT nasal spray Place 2 sprays into the nose daily as needed (allergies or rhinitis).     . potassium chloride SA  (K-DUR,KLOR-CON) 20 MEQ tablet Take 1 tablet (20 mEq total) by mouth 2 (two) times daily. 60 tablet 3  . rosuvastatin (CRESTOR) 10 MG tablet Take 1 tablet (10 mg total) by mouth daily. 30 tablet 3  . sildenafil (VIAGRA) 100 MG tablet Take 1 tablet (100 mg total) by mouth as directed. 15 tablet 3  . spironolactone (ALDACTONE) 25 MG tablet Take 1 tablet (25 mg total) by mouth daily. 30 tablet 3  . SYMBICORT 80-4.5 MCG/ACT inhaler USE 2 INHALATIONS TWICE A DAY 30.6 g 1  . XARELTO 20 MG TABS tablet TAKE 1 TABLET DAILY WITH SUPPER 90 tablet 2   No current facility-administered medications for this visit.     Physical Exam: Vitals:   07/03/17 1541  BP: 120/80  Pulse: 64  SpO2: 98%  Weight: (!) 308 lb 3.2 oz (139.8 kg)  Height: 6\' 1"  (1.854 m)    GEN- The patient is well appearing, alert and oriented x 3 today.   Head- normocephalic, atraumatic Eyes-  Sclera clear, conjunctiva pink Ears- hearing intact Oropharynx- clear Lungs- Clear to ausculation bilaterally, normal work of breathing Heart- irregular rate and rhythm, no murmurs, rubs or gallops, PMI not laterally displaced GI- soft, NT, ND, + BS Extremities- no clubbing, cyanosis, or edema  EKG tracing  ordered today is personally reviewed and shows afib, V rate 93 bpm, PVCs, RBBB, LAHB  Assessment and Plan:  1. Persistent afib Now back in afib but unaware On xarelto Rate controlled Severely enlarged LA (81mm) Given LA size of 27mm and minimal symptoms, will follow conservatively.  He is not interested in cardioversion currently  2. Chronic systolic dysfunction Echo 04/02/17 EF is preserved  3. Obesity Body mass index is 40.66 kg/m. Lifestyle modification again encouraged Not making much progress  4. OSA Complaint with CAPAP  5. HTN Stable No change required today  Return to see me in 4 months  Thompson Grayer MD, [facility] 07/03/2017 4:14 PM

## 2017-07-21 ENCOUNTER — Other Ambulatory Visit: Payer: Self-pay | Admitting: Physician Assistant

## 2017-08-22 ENCOUNTER — Encounter: Payer: Self-pay | Admitting: Internal Medicine

## 2017-09-17 ENCOUNTER — Other Ambulatory Visit: Payer: Self-pay | Admitting: Physician Assistant

## 2017-09-25 DIAGNOSIS — G4733 Obstructive sleep apnea (adult) (pediatric): Secondary | ICD-10-CM | POA: Diagnosis not present

## 2017-10-14 ENCOUNTER — Telehealth: Payer: Self-pay | Admitting: Internal Medicine

## 2017-10-14 MED ORDER — GLIPIZIDE 5 MG PO TABS: ORAL_TABLET | ORAL | 3 refills | Status: DC

## 2017-10-14 NOTE — Telephone Encounter (Signed)
glipizide refill Last OV: 09/09/16 Last Refill:10/01/16 #180 tabs 3 RF Pharmacy:[id number] at [address] of [place] PCP: Dr. Sharlet Salina Last Hgb A1C: 6.2 on 09/09/16  CPE appt made for 11/26/17 with PCP.

## 2017-10-14 NOTE — Telephone Encounter (Signed)
Copied from [phone] [phone]. Topic: Quick Communication - Rx Refill/Question >> Oct 14, 2017 10:47 AM Lennox Solders wrote: Medication:glipizide 5 mg #180 Has the patient contacted their pharmacy? No (Agent: If no, request that the patient contact the pharmacy for the refill.) Preferred Pharmacy (with phone number or street name): [facility] [place] [phone] . [facility]

## 2017-11-04 ENCOUNTER — Ambulatory Visit: Payer: BLUE CROSS/BLUE SHIELD | Admitting: Internal Medicine

## 2017-11-04 ENCOUNTER — Encounter: Payer: Self-pay | Admitting: Internal Medicine

## 2017-11-04 ENCOUNTER — Encounter (INDEPENDENT_AMBULATORY_CARE_PROVIDER_SITE_OTHER): Payer: Self-pay

## 2017-11-04 VITALS — BP 122/72 | HR 73 | Ht 73.0 in | Wt 293.0 lb

## 2017-11-04 DIAGNOSIS — I10 Essential (primary) hypertension: Secondary | ICD-10-CM

## 2017-11-04 DIAGNOSIS — I5022 Chronic systolic (congestive) heart failure: Secondary | ICD-10-CM | POA: Diagnosis not present

## 2017-11-04 DIAGNOSIS — I481 Persistent atrial fibrillation: Secondary | ICD-10-CM | POA: Diagnosis not present

## 2017-11-04 DIAGNOSIS — G4733 Obstructive sleep apnea (adult) (pediatric): Secondary | ICD-10-CM | POA: Diagnosis not present

## 2017-11-04 DIAGNOSIS — I4819 Other persistent atrial fibrillation: Secondary | ICD-10-CM

## 2017-11-04 NOTE — Patient Instructions (Addendum)
Medication Instructions:  Your physician recommends that you continue on your current medications as directed. Please refer to the Current Medication list given to you today.  Labwork: You will get lab work today:  BMET, CBC and magnesium.  Testing/Procedures: Your physician has requested that you have an echocardiogram. Echocardiography is a painless test that uses sound waves to create images of your heart. It provides your doctor with information about the size and shape of your heart and how well your heart's chambers and valves are working. This procedure takes approximately one hour. There are no restrictions for this procedure.  Please schedule for an ECHO in 6 months same day as Dr. Rayann Heman appointment.  Follow-Up: Your physician wants you to follow-up in: 6 months with Dr. Rayann Heman.    Any Other Special Instructions Will Be Listed Below (If Applicable).  If you need a refill on your cardiac medications before your next appointment, please call your pharmacy.

## 2017-11-04 NOTE — Progress Notes (Signed)
PCP: [name], MD   Primary EP: Dr Rayann Heman  [name] is a 62 y.o. male who presents today for routine electrophysiology followup.  Since last being seen in our clinic, the patient reports doing very well. He remains active.  Push mowing lawns and playing golf without interruption.  Today, he denies symptoms of palpitations, chest pain, shortness of breath,  lower extremity edema, dizziness, presyncope, or syncope.  The patient is otherwise without complaint today.   Past Medical History:  Diagnosis Date  . Allergy    SEASONAL  . Asthma   . Atrial fibrillation ([place])    persistant 02/2009  . Atrial flutter ([place])    s/p CTI ablation 08/06/10  . Cardiac arrhythmia due to congenital heart disease   . Cataract    BEGINING  . Chronic rhinitis   . Chronic systolic dysfunction of left ventricle   . Colon polyp   . Congestive heart failure ([place])   . Diverticulosis    colonoscopy 04/03/2009  . Hyperlipidemia   . Hypertension   . Hypertension   . Morbid obesity ([place])    target weight = 219  for BMI < 30  . Nonischemic cardiomyopathy ([facility])    tachycardia mediated  . Sleep apnea    original 2001   Past Surgical History:  Procedure Laterality Date  . atrial flutter ablation  08/06/10  . COLONOSCOPY      ROS- all systems are reviewed and negatives except as per HPI above  Current Outpatient Medications  Medication Sig Dispense Refill  . carvedilol (COREG) 25 MG tablet TAKE 1 TABLET (25 MG TOTAL) BY MOUTH 2 (TWO) TIMES DAILY WITH A MEAL. 180 tablet 3  . furosemide (LASIX) 80 MG tablet Take 0.5-1 tablets (40-80 mg total) by mouth daily as needed for fluid or edema. 90 tablet 3  . glipiZIDE (GLUCOTROL) 5 MG tablet TAKE 1 TABLET TWICE A DAY BEFORE MEALS 180 tablet 3  . KLOR-CON M20 20 MEQ tablet TAKE 1 TABLET BY MOUTH TWICE A DAY 180 tablet 3  . mometasone (NASONEX) 50 MCG/ACT nasal spray Place 2 sprays into the nose daily as needed (allergies or rhinitis).     .  rivaroxaban (XARELTO) 20 MG TABS tablet Take 1 tablet (20 mg total) by mouth daily with supper. 90 tablet 2  . rosuvastatin (CRESTOR) 10 MG tablet Take 1 tablet (10 mg total) by mouth daily. 90 tablet 3  . sildenafil (VIAGRA) 100 MG tablet Take 1 tablet (100 mg total) by mouth as directed. 15 tablet 3  . spironolactone (ALDACTONE) 25 MG tablet TAKE 1 TABLET BY MOUTH EVERY DAY 90 tablet 3  . SYMBICORT 80-4.5 MCG/ACT inhaler USE 2 INHALATIONS TWICE A DAY 30.6 g 1  . losartan (COZAAR) 50 MG tablet Take 1 tablet (50 mg total) by mouth daily. 90 tablet 3   No current facility-administered medications for this visit.     Physical Exam: Vitals:   11/04/17 1053  BP: 122/72  Pulse: 73  Weight: 293 lb (132.9 kg)  Height: 6\' 1"  (1.854 m)    GEN- The patient is well appearing, alert and oriented x 3 today.   Head- normocephalic, atraumatic Eyes-  Sclera clear, conjunctiva pink Ears- hearing intact Oropharynx- clear Lungs- Clear to ausculation bilaterally, normal work of breathing Heart- irregular rate and rhythm, no murmurs, rubs or gallops, PMI not laterally displaced GI- soft, NT, ND, + BS Extremities- no clubbing, cyanosis, or edema  Wt Readings from Last 3 Encounters:  11/04/17 293 lb (132.9 kg)  07/03/17 (!) 308 lb 3.2 oz (139.8 kg)  03/18/17 (!) 301 lb (136.5 kg)    EKG tracing ordered today is personally reviewed and shows afib, V rate 73 bpm, RBBB, Qtc 467 msec  Assessment and Plan:  1. Persistent afib Asymptomatic Rate controlled Severe right and left atrial enlargement (30mm) Will plan rate control long term On xarelto  2. Chronic systolic dysfunction EF has normalized previously with rate control Repeat echo upon return in 6 months  3. Obesity Body mass index is 38.66 kg/m. He has lost 15 lbs since his last visit!  4. HTN Stable No change required today  5. OSA Compliant with CPAP  6. Hypokalemia Bmet, mg today Consider reducing K further depending on  results  Return in 6 months with an echo  Thompson Grayer MD, [facility] 11/04/2017 11:55 AM

## 2017-11-05 LAB — BASIC METABOLIC PANEL
BUN/Creatinine Ratio: 19 (ref 10–24)
BUN: 40 mg/dL — AB (ref 8–27)
CALCIUM: 9.6 mg/dL (ref 8.6–10.2)
CHLORIDE: 100 mmol/L (ref 96–106)
CO2: 21 mmol/L (ref 20–29)
Creatinine, Ser: 2.07 mg/dL — ABNORMAL HIGH (ref 0.76–1.27)
GFR calc non Af Amer: 33 mL/min/{1.73_m2} — ABNORMAL LOW (ref >59)
GFR, EST AFRICAN AMERICAN: 39 mL/min/{1.73_m2} — AB (ref >59)
Glucose: 105 mg/dL — ABNORMAL HIGH (ref 65–99)
POTASSIUM: 5.3 mmol/L — AB (ref 3.5–5.2)
Sodium: 136 mmol/L (ref 134–144)

## 2017-11-05 LAB — MAGNESIUM: MAGNESIUM: 2.1 mg/dL (ref 1.6–2.3)

## 2017-11-05 LAB — CBC WITH DIFFERENTIAL/PLATELET
BASOS ABS: 0 10*3/uL (ref 0.0–0.2)
Basos: 1 %
EOS (ABSOLUTE): 0.1 10*3/uL (ref 0.0–0.4)
Eos: 2 %
HEMOGLOBIN: 17.7 g/dL (ref 13.0–17.7)
Hematocrit: 50.9 % (ref 37.5–51.0)
IMMATURE GRANS (ABS): 0 10*3/uL (ref 0.0–0.1)
Immature Granulocytes: 0 %
LYMPHS ABS: 1.7 10*3/uL (ref 0.7–3.1)
LYMPHS: 27 %
MCH: 31.3 pg (ref 26.6–33.0)
MCHC: 34.8 g/dL (ref 31.5–35.7)
MCV: 90 fL (ref 79–97)
MONOCYTES: 12 %
Monocytes Absolute: 0.7 10*3/uL (ref 0.1–0.9)
NEUTROS ABS: 3.8 10*3/uL (ref 1.4–7.0)
Neutrophils: 58 %
Platelets: 160 10*3/uL (ref 150–450)
RBC: 5.65 x10E6/uL (ref 4.14–5.80)
RDW: 14.8 % (ref 12.3–15.4)
WBC: 6.5 10*3/uL (ref 3.4–10.8)

## 2017-11-10 ENCOUNTER — Other Ambulatory Visit: Payer: Self-pay

## 2017-11-10 NOTE — Progress Notes (Signed)
Spironolactone discontinued d/t worsening kidney function per Dr. Rayann Heman.

## 2017-11-26 ENCOUNTER — Other Ambulatory Visit (INDEPENDENT_AMBULATORY_CARE_PROVIDER_SITE_OTHER): Payer: BLUE CROSS/BLUE SHIELD

## 2017-11-26 ENCOUNTER — Encounter: Payer: Self-pay | Admitting: Internal Medicine

## 2017-11-26 ENCOUNTER — Ambulatory Visit (INDEPENDENT_AMBULATORY_CARE_PROVIDER_SITE_OTHER): Payer: BLUE CROSS/BLUE SHIELD | Admitting: Internal Medicine

## 2017-11-26 VITALS — BP 122/80 | HR 75 | Temp 97.6°F | Ht 73.0 in | Wt 298.0 lb

## 2017-11-26 DIAGNOSIS — Z0000 Encounter for general adult medical examination without abnormal findings: Secondary | ICD-10-CM | POA: Diagnosis not present

## 2017-11-26 DIAGNOSIS — E785 Hyperlipidemia, unspecified: Secondary | ICD-10-CM

## 2017-11-26 DIAGNOSIS — G4733 Obstructive sleep apnea (adult) (pediatric): Secondary | ICD-10-CM

## 2017-11-26 DIAGNOSIS — E669 Obesity, unspecified: Secondary | ICD-10-CM

## 2017-11-26 DIAGNOSIS — E039 Hypothyroidism, unspecified: Secondary | ICD-10-CM

## 2017-11-26 DIAGNOSIS — E1169 Type 2 diabetes mellitus with other specified complication: Secondary | ICD-10-CM

## 2017-11-26 DIAGNOSIS — I10 Essential (primary) hypertension: Secondary | ICD-10-CM

## 2017-11-26 DIAGNOSIS — J4520 Mild intermittent asthma, uncomplicated: Secondary | ICD-10-CM | POA: Diagnosis not present

## 2017-11-26 LAB — COMPREHENSIVE METABOLIC PANEL
ALT: 35 U/L (ref 0–53)
AST: 31 U/L (ref 0–37)
Albumin: 4.2 g/dL (ref 3.5–5.2)
Alkaline Phosphatase: 56 U/L (ref 39–117)
BUN: 22 mg/dL (ref 6–23)
CO2: 27 mEq/L (ref 19–32)
CREATININE: 1.53 mg/dL — AB (ref 0.40–1.50)
Calcium: 9.6 mg/dL (ref 8.4–10.5)
Chloride: 99 mEq/L (ref 96–112)
GFR: 49.12 mL/min — ABNORMAL LOW (ref >60.00)
GLUCOSE: 113 mg/dL — AB (ref 70–99)
POTASSIUM: 4 meq/L (ref 3.5–5.1)
SODIUM: 136 meq/L (ref 135–145)
Total Bilirubin: 0.9 mg/dL (ref 0.2–1.2)
Total Protein: 7.4 g/dL (ref 6.0–8.3)

## 2017-11-26 LAB — MICROALBUMIN / CREATININE URINE RATIO
Creatinine,U: 33.1 mg/dL
MICROALB/CREAT RATIO: 9.2 mg/g (ref 0.0–30.0)
Microalb, Ur: 3 mg/dL — ABNORMAL HIGH (ref 0.0–1.9)

## 2017-11-26 LAB — TSH: TSH: 3.39 u[IU]/mL (ref 0.35–4.50)

## 2017-11-26 LAB — LIPID PANEL
Cholesterol: 148 mg/dL (ref 0–200)
HDL: 60.6 mg/dL (ref >39.00)
LDL Cholesterol: 69 mg/dL (ref 0–99)
NONHDL: 87.04
Total CHOL/HDL Ratio: 2
Triglycerides: 92 mg/dL (ref 0.0–149.0)
VLDL: 18.4 mg/dL (ref 0.0–40.0)

## 2017-11-26 LAB — T4, FREE: FREE T4: 0.78 ng/dL (ref 0.60–1.60)

## 2017-11-26 LAB — HEMOGLOBIN A1C: HEMOGLOBIN A1C: 6.1 % (ref 4.6–6.5)

## 2017-11-26 NOTE — Assessment & Plan Note (Signed)
BP at goal on losartan, coreg, lasix. Checking CMP as last was with decreased GFR from baseline. Complicated by CKD stage 3.

## 2017-11-26 NOTE — Progress Notes (Signed)
   Subjective:    Patient ID: [name], male    DOB: 12-22-1955, 62 y.o.   MRN: [id number]  HPI The patient is a 62 YO man coming in for physical. No new concerns. Some lab abnormalities with recent labs through cardiology.   PMH, [facility], social history reviewed and updated.  Review of Systems  Constitutional: Negative.   HENT: Negative.   Eyes: Negative.   Respiratory: Negative for cough, chest tightness and shortness of breath.   Cardiovascular: Negative for chest pain, palpitations and leg swelling.  Gastrointestinal: Negative for abdominal distention, abdominal pain, constipation, diarrhea, nausea and vomiting.  Musculoskeletal: Negative.   Skin: Negative.   Neurological: Negative.   Psychiatric/Behavioral: Negative.       Objective:   Physical Exam  Constitutional: He is oriented to person, place, and time. He appears well-developed and well-nourished.  HENT:  Head: Normocephalic and atraumatic.  Eyes: EOM are normal.  Neck: Normal range of motion.  Cardiovascular: Normal rate and regular rhythm.  Pulmonary/Chest: Effort normal and breath sounds normal. No respiratory distress. He has no wheezes. He has no rales.  Abdominal: Soft. Bowel sounds are normal. He exhibits no distension. There is no tenderness. There is no rebound.  Musculoskeletal: He exhibits no edema.  Neurological: He is alert and oriented to person, place, and time. Coordination normal.  Skin: Skin is warm and dry.  Psychiatric: He has a normal mood and affect.   Vitals:   11/26/17 0757  BP: 122/80  Pulse: 75  Temp: 97.6 F (36.4 C)  TempSrc: Oral  SpO2: 99%  Weight: 298 lb (135.2 kg)  Height: 6\' 1"  (1.854 m)      Assessment & Plan:

## 2017-11-26 NOTE — Assessment & Plan Note (Signed)
Flu shot yearly. Pneumonia up to date. Shingrix added to waiting list. Tetanus up to date. Colonoscopy due this year and he will schedule. Counseled about sun safety and mole surveillance. Counseled about the dangers of distracted driving. Given 10 year screening recommendations.

## 2017-11-26 NOTE — Assessment & Plan Note (Signed)
Taking glipizide with previous good control. Foot exam done, microalbumin to creatinine ratio checked and HgA1c and lipid panel checked today. Adjust as needed. Complicated by CKD and hyperlipidemia.

## 2017-11-26 NOTE — Patient Instructions (Signed)
We will check the labs today and call you back about the results. Depending on the kidney numbers we may make additional changes to the medicines.    Health Maintenance, Male A healthy lifestyle and preventive care is important for your health and wellness. Ask your health care provider about what schedule of regular examinations is right for you. What should I know about weight and diet? Eat a Healthy Diet  Eat plenty of vegetables, fruits, whole grains, low-fat dairy products, and lean protein.  Do not eat a lot of foods high in solid fats, added sugars, or salt.  Maintain a Healthy Weight Regular exercise can help you achieve or maintain a healthy weight. You should:  Do at least 150 minutes of exercise each week. The exercise should increase your heart rate and make you sweat (moderate-intensity exercise).  Do strength-training exercises at least twice a week.  Watch Your Levels of Cholesterol and Blood Lipids  Have your blood tested for lipids and cholesterol every 5 years starting at 63 years of age. If you are at high risk for heart disease, you should start having your blood tested when you are 63 years old. You may need to have your cholesterol levels checked more often if: ? Your lipid or cholesterol levels are high. ? You are older than 63 years of age. ? You are at high risk for heart disease.  What should I know about cancer screening? Many types of cancers can be detected early and may often be prevented. Lung Cancer  You should be screened every year for lung cancer if: ? You are a current smoker who has smoked for at least 30 years. ? You are a former smoker who has quit within the past 15 years.  Talk to your health care provider about your screening options, when you should start screening, and how often you should be screened.  Colorectal Cancer  Routine colorectal cancer screening usually begins at 63 years of age and should be repeated every 5-10 years until  you are 63 years old. You may need to be screened more often if early forms of precancerous polyps or small growths are found. Your health care provider may recommend screening at an earlier age if you have risk factors for colon cancer.  Your health care provider may recommend using home test kits to check for hidden blood in the stool.  A small camera at the end of a tube can be used to examine your colon (sigmoidoscopy or colonoscopy). This checks for the earliest forms of colorectal cancer.  Prostate and Testicular Cancer  Depending on your age and overall health, your health care provider may do certain tests to screen for prostate and testicular cancer.  Talk to your health care provider about any symptoms or concerns you have about testicular or prostate cancer.  Skin Cancer  Check your skin from head to toe regularly.  Tell your health care provider about any new moles or changes in moles, especially if: ? There is a change in a mole's size, shape, or color. ? You have a mole that is larger than a pencil eraser.  Always use sunscreen. Apply sunscreen liberally and repeat throughout the day.  Protect yourself by wearing long sleeves, pants, a wide-brimmed hat, and sunglasses when outside.  What should I know about heart disease, diabetes, and high blood pressure?  If you are 62-87 years of age, have your blood pressure checked every 3-5 years. If you are 40 years of  age or older, have your blood pressure checked every year. You should have your blood pressure measured twice-once when you are at a hospital or clinic, and once when you are not at a hospital or clinic. Record the average of the two measurements. To check your blood pressure when you are not at a hospital or clinic, you can use: ? An automated blood pressure machine at a pharmacy. ? A home blood pressure monitor.  Talk to your health care provider about your target blood pressure.  If you are between 34-79 years  old, ask your health care provider if you should take aspirin to prevent heart disease.  Have regular diabetes screenings by checking your fasting blood sugar level. ? If you are at a normal weight and have a low risk for diabetes, have this test once every three years after the age of 52. ? If you are overweight and have a high risk for diabetes, consider being tested at a younger age or more often.  A one-time screening for abdominal aortic aneurysm (AAA) by ultrasound is recommended for men aged 70-75 years who are current or former smokers. What should I know about preventing infection? Hepatitis B If you have a higher risk for hepatitis B, you should be screened for this virus. Talk with your health care provider to find out if you are at risk for hepatitis B infection. Hepatitis C Blood testing is recommended for:  Everyone born from [birth year] through 1966.  Anyone with known risk factors for hepatitis C.  Sexually Transmitted Diseases (STDs)  You should be screened each year for STDs including gonorrhea and chlamydia if: ? You are sexually active and are younger than 63 years of age. ? You are older than 63 years of age and your health care provider tells you that you are at risk for this type of infection. ? Your sexual activity has changed since you were last screened and you are at an increased risk for chlamydia or gonorrhea. Ask your health care provider if you are at risk.  Talk with your health care provider about whether you are at high risk of being infected with HIV. Your health care provider may recommend a prescription medicine to help prevent HIV infection.  What else can I do?  Schedule regular health, dental, and eye exams.  Stay current with your vaccines (immunizations).  Do not use any tobacco products, such as cigarettes, chewing tobacco, and e-cigarettes. If you need help quitting, ask your health care provider.  Limit alcohol intake to no more than 2 drinks  per day. One drink equals 12 ounces of beer, 5 ounces of wine, or 1 ounces of hard liquor.  Do not use street drugs.  Do not share needles.  Ask your health care provider for help if you need support or information about quitting drugs.  Tell your health care provider if you often feel depressed.  Tell your health care provider if you have ever been abused or do not feel safe at home. This information is not intended to replace advice given to you by your health care provider. Make sure you discuss any questions you have with your health care provider. Document Released: 11/29/2007 Document Revised: 01/30/2016 Document Reviewed: 03/06/2015 Elsevier Interactive Patient Education  [employer].

## 2017-11-26 NOTE — Assessment & Plan Note (Signed)
On symbicort as needed during allergy season and nasonex.

## 2017-11-26 NOTE — Assessment & Plan Note (Signed)
BMI 39 but complicated by diabetes, hyperlipidemia, OSA.

## 2017-11-26 NOTE — Assessment & Plan Note (Signed)
Checking TSH and T4 and add back medications as needed. Off meds currently.

## 2017-11-26 NOTE — Assessment & Plan Note (Signed)
Taking crestor 10 mg daily, checking lipid panel and adjust as needed.  

## 2017-11-26 NOTE — Assessment & Plan Note (Signed)
On CPAP and continue.

## 2017-12-02 ENCOUNTER — Encounter: Payer: Self-pay | Admitting: Internal Medicine

## 2017-12-02 NOTE — Progress Notes (Unsigned)
Abstracted and sent to scan  

## 2017-12-18 ENCOUNTER — Encounter: Payer: Self-pay | Admitting: Internal Medicine

## 2018-01-07 ENCOUNTER — Telehealth: Payer: Self-pay

## 2018-01-07 NOTE — Telephone Encounter (Signed)
Left message asking patient to call back to schedule nurse visit to get first shingrix vaccine---let tamara,RN at [place] office know when appt is made so that both vaccines can be labeled

## 2018-01-11 ENCOUNTER — Telehealth: Payer: Self-pay

## 2018-01-11 ENCOUNTER — Telehealth: Payer: Self-pay | Admitting: Internal Medicine

## 2018-01-11 NOTE — Telephone Encounter (Signed)
Copied from [place] [id number]. Topic: General - Other >> Jan 08, 2018  3:06 PM Judyann Munson wrote: Reason for CRM:  patient appt for first shingrix vaccine-- is schedule on 7-30 at 2:15pm . Please advise  Abriel Hattery,RN at [place] office so that both vaccines can be labeled.   Both vaccines have been labeled and placed in refrig

## 2018-01-11 NOTE — Telephone Encounter (Signed)
New Message          [facility] Pre-operative Risk Assessment    Request for surgical clearance:  1. What type of surgery is being performed? Oral surgery  2. When is this surgery scheduled? TBD   3. What type of clearance is required (medical clearance vs. Pharmacy clearance to hold med vs. Both)? Medical  4. Are there any medications that need to be held prior to surgery and how long? [name], how long  5. Practice name and name of physician performing surgery? Dr. Nicki Reaper Rhem  6. What is your office phone number [phone]   7.   What is your office fax number [phone]  8.   Anesthesia type (None, local, MAC, general) ? Local   [name] 01/11/2018, 5:02 PM  _________________________________________________________________   (provider comments below)

## 2018-01-12 ENCOUNTER — Ambulatory Visit (INDEPENDENT_AMBULATORY_CARE_PROVIDER_SITE_OTHER): Payer: BLUE CROSS/BLUE SHIELD

## 2018-01-12 DIAGNOSIS — Z299 Encounter for prophylactic measures, unspecified: Secondary | ICD-10-CM | POA: Diagnosis not present

## 2018-01-12 NOTE — Telephone Encounter (Signed)
Spoke with [facility] @ Dr. Joellyn Haff office. She states the treatment plan for pt is to extract # 8 & # 14 with local anesthesia.

## 2018-01-12 NOTE — Telephone Encounter (Signed)
To pharm for xarelto recommendations

## 2018-01-12 NOTE — Telephone Encounter (Signed)
Please check what type of oral surgery please.

## 2018-01-12 NOTE — Telephone Encounter (Signed)
To pharm please address thanks

## 2018-01-12 NOTE — Telephone Encounter (Signed)
Need to clarify procedure with more detail than oral surgery in order to provide appropriate anticoagulation clearance.

## 2018-01-13 NOTE — Telephone Encounter (Signed)
Pt takes Xarelto for afib with CHADS2VASc score of 2 (CHF, DM). Ok to hold Xarelto for 1 day if needed for 2 dental extractions.

## 2018-01-13 NOTE — Telephone Encounter (Signed)
   Primary Cardiologist: Thompson Grayer, MD  Chart reviewed as part of pre-operative protocol coverage. Given past medical history and time since last visit, based on ACC/AHA guidelines, [name] would be at acceptable risk for the planned procedure without further cardiovascular testing. See recs regarding Xarelto below.   I will route this recommendation to the requesting party via Epic fax function and remove from pre-op pool.  Please call with questions.  Lyda Jester, PA-C 01/13/2018, 1:55 PM

## 2018-03-03 ENCOUNTER — Encounter: Payer: Self-pay | Admitting: Nurse Practitioner

## 2018-03-03 ENCOUNTER — Ambulatory Visit: Payer: BLUE CROSS/BLUE SHIELD | Admitting: Nurse Practitioner

## 2018-03-03 VITALS — BP 148/90 | HR 91 | Ht 73.0 in | Wt 292.0 lb

## 2018-03-03 DIAGNOSIS — M5441 Lumbago with sciatica, right side: Secondary | ICD-10-CM | POA: Diagnosis not present

## 2018-03-03 DIAGNOSIS — Z23 Encounter for immunization: Secondary | ICD-10-CM | POA: Diagnosis not present

## 2018-03-03 MED ORDER — METHOCARBAMOL 500 MG PO TABS
500.0000 mg | ORAL_TABLET | Freq: Four times a day (QID) | ORAL | 1 refills | Status: DC
Start: 2018-03-03 — End: 2018-11-24

## 2018-03-03 NOTE — Patient Instructions (Signed)
I have sent a prescription for robaxin 500mg  four times daily as needed for your pain and muscle spasms. This medication can cause drowsiness. Please do not drink alcohol or operate machinery when you take this medication.  Please schedule a follow up appointment for further evaluation here with Dr Tamala Julian or Dr Raeford Razor, our sports medicine providers, they can provide a joint injection if you would like.   Back Exercises If you have pain in your back, do these exercises 2-3 times each day or as told by your doctor. When the pain goes away, do the exercises once each day, but repeat the steps more times for each exercise (do more repetitions). If you do not have pain in your back, do these exercises once each day or as told by your doctor. Exercises Single Knee to Chest  Do these steps 3-5 times in a row for each leg: 1. Lie on your back on a firm bed or the floor with your legs stretched out. 2. Bring one knee to your chest. 3. Hold your knee to your chest by grabbing your knee or thigh. 4. Pull on your knee until you feel a gentle stretch in your lower back. 5. Keep doing the stretch for 10-30 seconds. 6. Slowly let go of your leg and straighten it.  Pelvic Tilt  Do these steps 5-10 times in a row: 1. Lie on your back on a firm bed or the floor with your legs stretched out. 2. Bend your knees so they point up to the ceiling. Your feet should be flat on the floor. 3. Tighten your lower belly (abdomen) muscles to press your lower back against the floor. This will make your tailbone point up to the ceiling instead of pointing down to your feet or the floor. 4. Stay in this position for 5-10 seconds while you gently tighten your muscles and breathe evenly.  Cat-Cow  Do these steps until your lower back bends more easily: 1. Get on your hands and knees on a firm surface. Keep your hands under your shoulders, and keep your knees under your hips. You may put padding under your knees. 2. Let  your head hang down, and make your tailbone point down to the floor so your lower back is round like the back of a cat. 3. Stay in this position for 5 seconds. 4. Slowly lift your head and make your tailbone point up to the ceiling so your back hangs low (sags) like the back of a cow. 5. Stay in this position for 5 seconds.  Press-Ups  Do these steps 5-10 times in a row: 1. Lie on your belly (face-down) on the floor. 2. Place your hands near your head, about shoulder-width apart. 3. While you keep your back relaxed and keep your hips on the floor, slowly straighten your arms to raise the top half of your body and lift your shoulders. Do not use your back muscles. To make yourself more comfortable, you may change where you place your hands. 4. Stay in this position for 5 seconds. 5. Slowly return to lying flat on the floor.  Bridges  Do these steps 10 times in a row: 1. Lie on your back on a firm surface. 2. Bend your knees so they point up to the ceiling. Your feet should be flat on the floor. 3. Tighten your butt muscles and lift your butt off of the floor until your waist is almost as high as your knees. If you do not feel  the muscles working in your butt and the back of your thighs, slide your feet 1-2 inches farther away from your butt. 4. Stay in this position for 3-5 seconds. 5. Slowly lower your butt to the floor, and let your butt muscles relax.  If this exercise is too easy, try doing it with your arms crossed over your chest. Belly Crunches  Do these steps 5-10 times in a row: 1. Lie on your back on a firm bed or the floor with your legs stretched out. 2. Bend your knees so they point up to the ceiling. Your feet should be flat on the floor. 3. Cross your arms over your chest. 4. Tip your chin a little bit toward your chest but do not bend your neck. 5. Tighten your belly muscles and slowly raise your chest just enough to lift your shoulder blades a tiny bit off of the  floor. 6. Slowly lower your chest and your head to the floor.  Back Lifts Do these steps 5-10 times in a row: 1. Lie on your belly (face-down) with your arms at your sides, and rest your forehead on the floor. 2. Tighten the muscles in your legs and your butt. 3. Slowly lift your chest off of the floor while you keep your hips on the floor. Keep the back of your head in line with the curve in your back. Look at the floor while you do this. 4. Stay in this position for 3-5 seconds. 5. Slowly lower your chest and your face to the floor.  Contact a doctor if:  Your back pain gets a lot worse when you do an exercise.  Your back pain does not lessen 2 hours after you exercise. If you have any of these problems, stop doing the exercises. Do not do them again unless your doctor says it is okay. Get help right away if:  You have sudden, very bad back pain. If this happens, stop doing the exercises. Do not do them again unless your doctor says it is okay. This information is not intended to replace advice given to you by your health care provider. Make sure you discuss any questions you have with your health care provider. Document Released: 07/05/2010 Document Revised: 11/08/2015 Document Reviewed: 07/27/2014 Elsevier Interactive Patient Education  [employer].

## 2018-03-03 NOTE — Progress Notes (Signed)
Name: [name]   MRN: [id number]    DOB: [birth year]   Date:03/03/2018       Progress Note  Subjective  Chief Complaint  Chief Complaint  Patient presents with  . Back Pain    Right side radiating into hip    HPI  [name] is here today for evaluation of an acute complaint of right lower back and hip pain, began last week and worse since playing golf on Saturday. He describes as muscle spasms in his right lower back and buttocks.  No fevers, chills, numbness, tingling, bowel bladder changes. Tried at home- ibuprofen, blue emu, hip freeze, cbd, menthol with some relief of pain. He is requesting a joint injection for his pain today, telling me hes had 'muscle relaxer joint injections' here in the past for similar pain in his shoulder and neck which he thinks would help his back  Patient Active Problem List   Diagnosis Date Noted  . Type 2 diabetes mellitus with hyperlipidemia ([place]) 09/09/2016  . History of colon polyps 05/17/2014  . Routine general medical examination at a health care facility 05/08/2014  . Hypothyroidism 03/22/2010  . Chronic systolic heart failure ([place]) 03/28/2009  . ATRIAL FIBRILLATION 03/09/2009  . Asthma 01/25/2008  . Hyperlipidemia associated with type 2 diabetes mellitus ([place]) 01/24/2008  . MORBID OBESITY 01/24/2008  . Essential hypertension 01/24/2008  . Sleep apnea 01/24/2008    Social History   Tobacco Use  . Smoking status: Never Smoker  . Smokeless tobacco: Never Used  Substance Use Topics  . Alcohol use: Yes    Alcohol/week: 0.0 standard drinks    Comment: previously quite heavy, working on cessation      Current Outpatient Medications:  .  carvedilol (COREG) 25 MG tablet, TAKE 1 TABLET (25 MG TOTAL) BY MOUTH 2 (TWO) TIMES DAILY WITH A MEAL., Disp: 180 tablet, Rfl: 3 .  furosemide (LASIX) 80 MG tablet, Take 0.5-1 tablets (40-80 mg total) by mouth daily as needed for fluid or edema., Disp: 90 tablet, Rfl: 3 .  glipiZIDE (GLUCOTROL) 5  MG tablet, TAKE 1 TABLET TWICE A DAY BEFORE MEALS, Disp: 180 tablet, Rfl: 3 .  KLOR-CON M20 20 MEQ tablet, TAKE 1 TABLET BY MOUTH TWICE A DAY (Patient taking differently: TAKE 1 TABLET BY MOUTH TWICE A DAY every other day), Disp: 180 tablet, Rfl: 3 .  mometasone (NASONEX) 50 MCG/ACT nasal spray, Place 2 sprays into the nose daily as needed (allergies or rhinitis). , Disp: , Rfl:  .  rivaroxaban (XARELTO) 20 MG TABS tablet, Take 1 tablet (20 mg total) by mouth daily with supper., Disp: 90 tablet, Rfl: 2 .  rosuvastatin (CRESTOR) 10 MG tablet, Take 1 tablet (10 mg total) by mouth daily., Disp: 90 tablet, Rfl: 3 .  sildenafil (VIAGRA) 100 MG tablet, Take 1 tablet (100 mg total) by mouth as directed., Disp: 15 tablet, Rfl: 3 .  SYMBICORT 80-4.5 MCG/ACT inhaler, USE 2 INHALATIONS TWICE A DAY, Disp: 30.6 g, Rfl: 1 .  losartan (COZAAR) 50 MG tablet, Take 1 tablet (50 mg total) by mouth daily., Disp: 90 tablet, Rfl: 3  No Known Allergies  ROS   No other specific complaints in a complete review of systems (except as listed in HPI above).  Objective  Vitals:   03/03/18 1549  BP: (!) 148/90  Pulse: 91  SpO2: 97%  Weight: 292 lb (132.5 kg)  Height: 6\' 1"  (1.854 m)    Body mass index is 38.52 kg/m.  Nursing  Note and Vital Signs reviewed.  Physical Exam  Constitutional: Patient appears well-developed and well-nourished.  No distress.  HEENT: head atraumatic, normocephalic, pupils equal and reactive to light, EOM's intact, neck supple, oropharynx pink and moist without exudate Cardiovascular: Normal rate, regular rhythm, distal pulses intact Pulmonary/Chest: Effort normal, No respiratory distress or retractions. Musculoskeletal: Normal range of motion, No gross deformities. No tenderness or swelling. Neurological: he is alert and oriented to person, place, and time. No cranial nerve deficit. Coordination, balance, strength, speech and gait are normal.  Skin: Skin is warm and dry. No rash  noted. No erythema.  Psychiatric: Patient has a normal mood and affect. behavior is normal. Judgment and thought content normal.   Assessment & Plan  Acute right-sided low back pain with right-sided sciatica We discussed returning to [facility] provider for joint injection, further evaluation of back pain, he became very upset telling me that his PCP has given him joint injections in the past but I can not find this documented which we discussed, I did tell him that I am not able to give joint injections but have sent Rx muscle relaxer to his pharmacy, however he remained very upset and left prior to reviewing entire AVS -additional education, Follow up and care instructions discussed and provided in AVS. - methocarbamol (ROBAXIN) 500 MG tablet; Take 1 tablet (500 mg total) by mouth 4 (four) times daily.  Dispense: 30 tablet; Refill: 1

## 2018-03-18 ENCOUNTER — Ambulatory Visit (INDEPENDENT_AMBULATORY_CARE_PROVIDER_SITE_OTHER): Payer: BLUE CROSS/BLUE SHIELD

## 2018-03-18 DIAGNOSIS — Z23 Encounter for immunization: Secondary | ICD-10-CM | POA: Diagnosis not present

## 2018-04-04 ENCOUNTER — Other Ambulatory Visit: Payer: Self-pay | Admitting: Internal Medicine

## 2018-04-07 ENCOUNTER — Encounter: Payer: Self-pay | Admitting: Internal Medicine

## 2018-04-07 ENCOUNTER — Ambulatory Visit: Payer: BLUE CROSS/BLUE SHIELD | Admitting: Internal Medicine

## 2018-04-07 VITALS — BP 132/88 | HR 74 | Ht 73.0 in | Wt 285.2 lb

## 2018-04-07 DIAGNOSIS — I4819 Other persistent atrial fibrillation: Secondary | ICD-10-CM | POA: Diagnosis not present

## 2018-04-07 DIAGNOSIS — G4733 Obstructive sleep apnea (adult) (pediatric): Secondary | ICD-10-CM

## 2018-04-07 DIAGNOSIS — I5022 Chronic systolic (congestive) heart failure: Secondary | ICD-10-CM

## 2018-04-07 DIAGNOSIS — I10 Essential (primary) hypertension: Secondary | ICD-10-CM | POA: Diagnosis not present

## 2018-04-07 NOTE — Patient Instructions (Addendum)
Medication Instructions:  Your physician has recommended you make the following change in your medication:   1.  In 2 days decrease your lasix 80 mg---Take 1/2 tablet (40 mg) by mouth daily   If you need a refill on your cardiac medications before your next appointment, please call your pharmacy.   Lab work: You will get lab work today:  BMP, BNP, CBC  If you have labs (blood work) drawn today and your tests are completely normal, you will receive your results only by: [name] MyChart Message (if you have MyChart) OR . A paper copy in the mail If you have any lab test that is abnormal or we need to change your treatment, we will call you to review the results.  Testing/Procedures: None ordered.  Follow-Up:  Your physician wants you to follow-up after your ECHO on May 03, 2018 at 11:15 am.   At [facility], you and your health needs are our priority.  As part of our continuing mission to provide you with exceptional heart care, we have created designated Provider Care Teams.  These Care Teams include your primary Cardiologist (physician) and Advanced Practice Providers (APPs -  Physician Assistants and Nurse Practitioners) who all work together to provide you with the care you need, when you need it.  Any Other Special Instructions Will Be Listed Below (If Applicable).  WEIGH YOURSELF DAILY  DECREASE YOUR SALT INTAKE

## 2018-04-07 NOTE — Progress Notes (Signed)
PCP: [name], MD   Primary EP: Dr Rayann Heman  [name] is a 62 y.o. male who presents today for routine electrophysiology followup.  Since last being seen in our clinic, the patient reports doing reasonably well.  Last week, he began having SOB and wheezing.  He increased his lasix and reports after 2-3 days his symptoms resolved.  Today, he denies symptoms of palpitations, chest pain,  lower extremity edema, dizziness, presyncope, or syncope.  The patient is otherwise without complaint today.   Past Medical History:  Diagnosis Date  . Allergy    SEASONAL  . Asthma   . Atrial fibrillation ([place])    persistant 02/2009  . Atrial flutter ([place])    s/p CTI ablation 08/06/10  . Cardiac arrhythmia due to congenital heart disease   . Cataract    BEGINING  . Chronic rhinitis   . Chronic systolic dysfunction of left ventricle   . Colon polyp   . Congestive heart failure ([place])   . Diverticulosis    colonoscopy 04/03/2009  . Hyperlipidemia   . Hypertension   . Hypertension   . Morbid obesity ([place])    target weight = 219  for BMI < 30  . Nonischemic cardiomyopathy ([facility])    tachycardia mediated  . Sleep apnea    original 2001   Past Surgical History:  Procedure Laterality Date  . atrial flutter ablation  08/06/10  . COLONOSCOPY      ROS- all systems are reviewed and negatives except as per HPI above  Current Outpatient Medications  Medication Sig Dispense Refill  . carvedilol (COREG) 25 MG tablet TAKE 1 TABLET (25 MG TOTAL) BY MOUTH 2 (TWO) TIMES DAILY WITH A MEAL. 180 tablet 3  . furosemide (LASIX) 80 MG tablet Take 0.5-1 tablets (40-80 mg total) by mouth daily as needed for fluid or edema. 90 tablet 3  . glipiZIDE (GLUCOTROL) 5 MG tablet TAKE 1 TABLET TWICE A DAY BEFORE MEALS 180 tablet 3  . KLOR-CON M20 20 MEQ tablet TAKE 1 TABLET BY MOUTH TWICE A DAY (Patient taking differently: TAKE 1 TABLET BY MOUTH TWICE A DAY every other day) 180 tablet 3  . losartan  (COZAAR) 50 MG tablet Take 1 tablet (50 mg total) by mouth daily. 90 tablet 3  . methocarbamol (ROBAXIN) 500 MG tablet Take 1 tablet (500 mg total) by mouth 4 (four) times daily. 30 tablet 1  . mometasone (NASONEX) 50 MCG/ACT nasal spray Place 2 sprays into the nose daily as needed (allergies or rhinitis).     . rosuvastatin (CRESTOR) 10 MG tablet Take 1 tablet (10 mg total) by mouth daily. 90 tablet 3  . sildenafil (VIAGRA) 100 MG tablet Take 1 tablet (100 mg total) by mouth as directed. 15 tablet 3  . SYMBICORT 80-4.5 MCG/ACT inhaler USE 2 INHALATIONS TWICE A DAY 30.6 g 1  . XARELTO 20 MG TABS tablet TAKE 1 TABLET (20 MG TOTAL) BY MOUTH DAILY WITH SUPPER. 90 tablet 1   No current facility-administered medications for this visit.     Physical Exam: Vitals:   04/07/18 1225  BP: 132/88  Pulse: 74  SpO2: 98%  Weight: 285 lb 3.2 oz (129.4 kg)  Height: 6\' 1"  (1.854 m)    GEN- The patient is well appearing, alert and oriented x 3 today.   Head- normocephalic, atraumatic Eyes-  Sclera clear, conjunctiva pink Ears- hearing intact Oropharynx- clear Lungs- Clear to ausculation bilaterally, normal work of breathing Heart- irregular rate  and rhythm, no murmurs, rubs or gallops, PMI not laterally displaced GI- soft, NT, ND, + BS Extremities- no clubbing, cyanosis, or edema  Wt Readings from Last 3 Encounters:  04/07/18 285 lb 3.2 oz (129.4 kg)  03/03/18 292 lb (132.5 kg)  11/26/17 298 lb (135.2 kg)    EKG tracing ordered today is personally reviewed and shows afib, RBBB, LAHB, V rates 75 bpm  Assessment and Plan:  1. Persistent afib I worry about CHF secondary to AF. He has severe LA enlargement. Our treatment options are limited.  We will treat with rate control for now. On xarelto Bmet, cbc  2. Chronic systolic dysfunction Repeat echo 2 gram sodium diet Continue lasix 80mg  daily x 2 days then 40mg  daily Bmet, bnp  3. Obesity Body mass index is 37.63 kg/m. Lifestyle  modification encouarged  4. HTN Stable No change required today  5. OSA Compliance with CPAP encoaraged  Return to see me in 3 weeks  Thompson Grayer MD, [facility] 04/07/2018 1:19 PM

## 2018-04-08 LAB — CBC WITH DIFFERENTIAL/PLATELET
BASOS ABS: 0.1 10*3/uL (ref 0.0–0.2)
Basos: 1 %
EOS (ABSOLUTE): 0.1 10*3/uL (ref 0.0–0.4)
EOS: 2 %
HEMOGLOBIN: 18.5 g/dL — AB (ref 13.0–17.7)
Hematocrit: 55.3 % — ABNORMAL HIGH (ref 37.5–51.0)
IMMATURE GRANS (ABS): 0 10*3/uL (ref 0.0–0.1)
Immature Granulocytes: 0 %
LYMPHS: 25 %
Lymphocytes Absolute: 1.7 10*3/uL (ref 0.7–3.1)
MCH: 29 pg (ref 26.6–33.0)
MCHC: 33.5 g/dL (ref 31.5–35.7)
MCV: 87 fL (ref 79–97)
MONOCYTES: 10 %
Monocytes Absolute: 0.7 10*3/uL (ref 0.1–0.9)
NEUTROS ABS: 4 10*3/uL (ref 1.4–7.0)
Neutrophils: 62 %
Platelets: 167 10*3/uL (ref 150–450)
RBC: 6.38 x10E6/uL — AB (ref 4.14–5.80)
RDW: 15 % (ref 12.3–15.4)
WBC: 6.6 10*3/uL (ref 3.4–10.8)

## 2018-04-08 LAB — BASIC METABOLIC PANEL
BUN/Creatinine Ratio: 13 (ref 10–24)
BUN: 21 mg/dL (ref 8–27)
CALCIUM: 9.2 mg/dL (ref 8.6–10.2)
CHLORIDE: 96 mmol/L (ref 96–106)
CO2: 28 mmol/L (ref 20–29)
CREATININE: 1.66 mg/dL — AB (ref 0.76–1.27)
GFR, EST AFRICAN AMERICAN: 50 mL/min/{1.73_m2} — AB (ref >59)
GFR, EST NON AFRICAN AMERICAN: 43 mL/min/{1.73_m2} — AB (ref >59)
Glucose: 73 mg/dL (ref 65–99)
Potassium: 3.1 mmol/L — ABNORMAL LOW (ref 3.5–5.2)
Sodium: 143 mmol/L (ref 134–144)

## 2018-04-08 LAB — BRAIN NATRIURETIC PEPTIDE: BNP: 367.1 pg/mL — AB (ref 0.0–100.0)

## 2018-04-09 ENCOUNTER — Other Ambulatory Visit: Payer: Self-pay

## 2018-04-09 MED ORDER — LOSARTAN POTASSIUM 50 MG PO TABS: 50.0000 mg | ORAL_TABLET | Freq: Every day | ORAL | 3 refills | Status: DC

## 2018-04-09 NOTE — Addendum Note (Signed)
Addended by: Rose Phi on: 04/09/2018 09:29 AM   Modules accepted: Orders

## 2018-04-09 NOTE — Progress Notes (Signed)
Call placed to Pt.  Advised Pt to take his potassium supplement every day for next 4 days.  Pt will decrease his lasix back to 40 mg daily on Saturday.  Pt has f/u appt with Dr. Rayann Heman in 3 weeks.  Anticipate rechecking bmp at that time.

## 2018-05-03 ENCOUNTER — Ambulatory Visit (INDEPENDENT_AMBULATORY_CARE_PROVIDER_SITE_OTHER): Payer: BLUE CROSS/BLUE SHIELD | Admitting: Internal Medicine

## 2018-05-03 ENCOUNTER — Encounter: Payer: Self-pay | Admitting: Internal Medicine

## 2018-05-03 ENCOUNTER — Ambulatory Visit: Payer: BLUE CROSS/BLUE SHIELD | Admitting: Internal Medicine

## 2018-05-03 ENCOUNTER — Ambulatory Visit (HOSPITAL_COMMUNITY): Payer: BLUE CROSS/BLUE SHIELD | Attending: Cardiology

## 2018-05-03 ENCOUNTER — Other Ambulatory Visit: Payer: Self-pay

## 2018-05-03 VITALS — BP 132/84 | HR 99 | Ht 73.5 in | Wt 278.4 lb

## 2018-05-03 DIAGNOSIS — G4733 Obstructive sleep apnea (adult) (pediatric): Secondary | ICD-10-CM | POA: Insufficient documentation

## 2018-05-03 DIAGNOSIS — I10 Essential (primary) hypertension: Secondary | ICD-10-CM

## 2018-05-03 DIAGNOSIS — I4819 Other persistent atrial fibrillation: Secondary | ICD-10-CM

## 2018-05-03 DIAGNOSIS — I5022 Chronic systolic (congestive) heart failure: Secondary | ICD-10-CM | POA: Diagnosis not present

## 2018-05-03 DIAGNOSIS — R0602 Shortness of breath: Secondary | ICD-10-CM

## 2018-05-03 MED ORDER — AMIODARONE HCL 200 MG PO TABS: 400.0000 mg | ORAL_TABLET | Freq: Two times a day (BID) | ORAL | 0 refills | Status: DC

## 2018-05-03 MED ORDER — AMIODARONE HCL 200 MG PO TABS: 200.0000 mg | ORAL_TABLET | Freq: Two times a day (BID) | ORAL | 3 refills | Status: DC

## 2018-05-03 NOTE — Patient Instructions (Addendum)
Medication Instructions:  Your physician has recommended you make the following change in your medication:  1.  Start taking amiodarone 200 mg---TAKE 2 TABS (400 mg) by mouth twice a day for 2 weeks.  2.  After 2 weeks reduce your amiodarone 200 mg---TAKE 1 TABLET (200 mg) by mouth twice a day.  Labwork: You will get lab work today:  BMP, magnesium, liver panel and TSH  Testing/Procedures: Your physician has recommended that you have a pulmonary function test. Pulmonary Function Tests are a group of tests that measure how well air moves in and out of your lungs.  Please schedule for PFT's  Follow-Up: Your physician wants you to follow-up in: 4 weeks with Roderic Palau NP at the AFIB clinic.      Any Other Special Instructions Will Be Listed Below (If Applicable).  If you need a refill on your cardiac medications before your next appointment, please call your pharmacy.

## 2018-05-03 NOTE — Progress Notes (Signed)
PCP: [name], MD   Primary EP: Dr Rayann Heman  [name] is a 62 y.o. male who presents today for routine electrophysiology followup.  Since last being seen in our clinic, the patient reports doing reasonably well.  CHF is improved.  Today, he denies symptoms of palpitations, chest pain, shortness of breath,  lower extremity edema, dizziness, presyncope, or syncope.  The patient is otherwise without complaint today.   Past Medical History:  Diagnosis Date  . Allergy    SEASONAL  . Asthma   . Atrial fibrillation ([place])    persistant 02/2009  . Atrial flutter ([place])    s/p CTI ablation 08/06/10  . Cardiac arrhythmia due to congenital heart disease   . Cataract    BEGINING  . Chronic rhinitis   . Chronic systolic dysfunction of left ventricle   . Colon polyp   . Congestive heart failure ([place])   . Diverticulosis    colonoscopy 04/03/2009  . Hyperlipidemia   . Hypertension   . Hypertension   . Morbid obesity ([place])    target weight = 219  for BMI < 30  . Nonischemic cardiomyopathy ([facility])    tachycardia mediated  . Sleep apnea    original 2001   Past Surgical History:  Procedure Laterality Date  . atrial flutter ablation  08/06/10  . COLONOSCOPY      ROS- all systems are reviewed and negatives except as per HPI above  Current Outpatient Medications  Medication Sig Dispense Refill  . carvedilol (COREG) 25 MG tablet TAKE 1 TABLET (25 MG TOTAL) BY MOUTH 2 (TWO) TIMES DAILY WITH A MEAL. 180 tablet 3  . furosemide (LASIX) 80 MG tablet Take 0.5-1 tablets (40-80 mg total) by mouth daily as needed for fluid or edema. 90 tablet 3  . glipiZIDE (GLUCOTROL) 5 MG tablet TAKE 1 TABLET TWICE A DAY BEFORE MEALS 180 tablet 3  . KLOR-CON M20 20 MEQ tablet TAKE 1 TABLET BY MOUTH TWICE A DAY (Patient taking differently: TAKE 1 TABLET BY MOUTH TWICE A DAY every other day) 180 tablet 3  . losartan (COZAAR) 50 MG tablet Take 1 tablet (50 mg total) by mouth daily. 90 tablet 3  .  methocarbamol (ROBAXIN) 500 MG tablet Take 1 tablet (500 mg total) by mouth 4 (four) times daily. 30 tablet 1  . mometasone (NASONEX) 50 MCG/ACT nasal spray Place 2 sprays into the nose daily as needed (allergies or rhinitis).     . rosuvastatin (CRESTOR) 10 MG tablet Take 1 tablet (10 mg total) by mouth daily. 90 tablet 3  . sildenafil (VIAGRA) 100 MG tablet Take 1 tablet (100 mg total) by mouth as directed. 15 tablet 3  . SYMBICORT 80-4.5 MCG/ACT inhaler USE 2 INHALATIONS TWICE A DAY 30.6 g 1  . XARELTO 20 MG TABS tablet TAKE 1 TABLET (20 MG TOTAL) BY MOUTH DAILY WITH SUPPER. 90 tablet 1   No current facility-administered medications for this visit.     Physical Exam: Vitals:   05/03/18 1200  BP: 132/84  Pulse: 99  SpO2: 98%  Weight: 278 lb 6.4 oz (126.3 kg)  Height: 6' 1.5" (1.867 m)    GEN- The patient is well appearing, alert and oriented x 3 today.   Head- normocephalic, atraumatic Eyes-  Sclera clear, conjunctiva pink Ears- hearing intact Oropharynx- clear Lungs- Clear to ausculation bilaterally, normal work of breathing Heart- irregular rate and rhythm, no murmurs, rubs or gallops, PMI not laterally displaced GI- soft, NT, ND, +  BS Extremities- no clubbing, cyanosis, or edema  Wt Readings from Last 3 Encounters:  05/03/18 278 lb 6.4 oz (126.3 kg)  04/07/18 285 lb 3.2 oz (129.4 kg)  03/03/18 292 lb (132.5 kg)    EKG tracing ordered today is personally reviewed and shows afib, RBBB, LAHB  Assessment and Plan:  1. Persistent afib Echo is personally reviewed I suspect reduced EF is due to AF.  He may be better served with sinus rhythm. Therapeutic strategies for afib including medicine (tikosyn, amiodarone) and repeat ablation were discussed in detail with the patient today. Risk, benefits, and alternatives to each approach was discussed at length.  He understands risks of amiodarone which were discussed but wishes to proceed with this medicine. Check lfts, tfts, pfts  today Amiodarone 400mg  BID x 2 weeks then 200mg  BID Follow-up in AF clinic in 4 weeks to arrange cardioversion if still in AF Importance of compliance with xarelto discussed today  2. Chronic systolic dysfunction EF has worsened with AF Continue current diuresis as feels well Bmet, mg today  3. OSA Compliance with CPAP encouraged  4. Obesity Body mass index is 36.23 kg/m. Lifestyle modification is encouraged  AF clinic in 4 weeks  Thompson Grayer MD, [facility] 05/03/2018 12:08 PM

## 2018-05-04 LAB — BASIC METABOLIC PANEL
BUN / CREAT RATIO: 12 (ref 10–24)
BUN: 19 mg/dL (ref 8–27)
CO2: 24 mmol/L (ref 20–29)
Calcium: 9.3 mg/dL (ref 8.6–10.2)
Chloride: 101 mmol/L (ref 96–106)
Creatinine, Ser: 1.55 mg/dL — ABNORMAL HIGH (ref 0.76–1.27)
GFR calc Af Amer: 54 mL/min/{1.73_m2} — ABNORMAL LOW (ref >59)
GFR calc non Af Amer: 47 mL/min/{1.73_m2} — ABNORMAL LOW (ref >59)
Glucose: 113 mg/dL — ABNORMAL HIGH (ref 65–99)
POTASSIUM: 4 mmol/L (ref 3.5–5.2)
SODIUM: 147 mmol/L — AB (ref 134–144)

## 2018-05-04 LAB — HEPATIC FUNCTION PANEL
ALK PHOS: 69 IU/L (ref 39–117)
ALT: 16 IU/L (ref 0–44)
AST: 23 IU/L (ref 0–40)
Albumin: 4.2 g/dL (ref 3.6–4.8)
Bilirubin Total: 1 mg/dL (ref 0.0–1.2)
Bilirubin, Direct: 0.27 mg/dL (ref 0.00–0.40)
TOTAL PROTEIN: 7.2 g/dL (ref 6.0–8.5)

## 2018-05-04 LAB — MAGNESIUM: Magnesium: 1.9 mg/dL (ref 1.6–2.3)

## 2018-05-04 LAB — TSH: TSH: 4.8 u[IU]/mL — ABNORMAL HIGH (ref 0.450–4.500)

## 2018-05-11 ENCOUNTER — Telehealth: Payer: Self-pay

## 2018-05-11 ENCOUNTER — Ambulatory Visit (INDEPENDENT_AMBULATORY_CARE_PROVIDER_SITE_OTHER): Payer: BLUE CROSS/BLUE SHIELD | Admitting: Internal Medicine

## 2018-05-11 DIAGNOSIS — R0602 Shortness of breath: Secondary | ICD-10-CM

## 2018-05-11 LAB — PULMONARY FUNCTION TEST
FEF 25-75 Post: 2.47 L/sec
FEF 25-75 Pre: 1.91 L/sec
FEF2575-%CHANGE-POST: 29 %
FEF2575-%Pred-Post: 78 %
FEF2575-%Pred-Pre: 60 %
FEV1-%Change-Post: 6 %
FEV1-%Pred-Post: 72 %
FEV1-%Pred-Pre: 68 %
FEV1-PRE: 2.72 L
FEV1-Post: 2.88 L
FEV1FVC-%CHANGE-POST: 1 %
FEV1FVC-%Pred-Pre: 97 %
FEV6-%Change-Post: 4 %
FEV6-%PRED-PRE: 72 %
FEV6-%Pred-Post: 75 %
FEV6-PRE: 3.65 L
FEV6-Post: 3.81 L
FEV6FVC-%Change-Post: 0 %
FEV6FVC-%PRED-PRE: 103 %
FEV6FVC-%Pred-Post: 103 %
FVC-%Change-Post: 4 %
FVC-%PRED-PRE: 70 %
FVC-%Pred-Post: 73 %
FVC-POST: 3.86 L
FVC-PRE: 3.7 L
POST FEV6/FVC RATIO: 99 %
PRE FEV6/FVC RATIO: 99 %
Post FEV1/FVC ratio: 75 %
Pre FEV1/FVC ratio: 73 %
RV % PRED: 127 %
RV: 3.17 L
TLC % pred: 100 %
TLC: 7.74 L

## 2018-05-11 NOTE — Progress Notes (Signed)
PFT completed today.  

## 2018-05-11 NOTE — Telephone Encounter (Signed)
Left detailed message per DPR.  Advised TSH elevated and he should f/u with PCP.  Advised labs had been sent to his PCP by Dr. Rayann Heman.  Advised to call office if any questions.

## 2018-05-11 NOTE — Telephone Encounter (Signed)
-----   Message from Thompson Grayer, MD sent at 05/09/2018  9:02 PM EST ----- Results reviewed.  Sonia Baller, please inform pt of result.  He should follow-up with PCP regarding thyroid function. I will route to primary care also.

## 2018-05-20 ENCOUNTER — Ambulatory Visit: Payer: BLUE CROSS/BLUE SHIELD | Admitting: Internal Medicine

## 2018-05-20 ENCOUNTER — Encounter: Payer: Self-pay | Admitting: Internal Medicine

## 2018-05-20 VITALS — BP 140/100 | HR 63 | Temp 97.5°F | Ht 73.5 in | Wt 289.0 lb

## 2018-05-20 DIAGNOSIS — L97201 Non-pressure chronic ulcer of unspecified calf limited to breakdown of skin: Secondary | ICD-10-CM

## 2018-05-20 DIAGNOSIS — L97202 Non-pressure chronic ulcer of unspecified calf with fat layer exposed: Secondary | ICD-10-CM

## 2018-05-20 DIAGNOSIS — E11622 Type 2 diabetes mellitus with other skin ulcer: Secondary | ICD-10-CM | POA: Diagnosis not present

## 2018-05-20 MED ORDER — GABAPENTIN 300 MG PO CAPS: 300.0000 mg | ORAL_CAPSULE | Freq: Every day | ORAL | 0 refills | Status: DC

## 2018-05-20 NOTE — Patient Instructions (Signed)
We will get you in with the wound clinic in 1-2 weeks in case this is not healing.   We have sent in gabapentin to use at night time so you can sleep. We have sent in 300 mg daily to take at night time.

## 2018-05-20 NOTE — Progress Notes (Signed)
   Subjective:    Patient ID: [name], male    DOB: 1955/08/18, 62 y.o.   MRN: [id number]  HPI The patient is a 62 YO man coming in for wound on right leg. Started a couple of months ago. Possibly sept or oct. He is not sure what happened to it but maybe a cut or scrape. Denies fevers or chills. It is located even with the car and so when getting in and out he hits it a lot and feels that this has kept it from healing. He has not seen it well due to location on posterior calf. He has been having it to hurt at night time with lying down and tingling kind of. He had some leftover gabapentin which he took the last 2 nights and has helped with the pain. He looked at it with a mirror this week and saw a red ring around it and was worried about infection and came in. Denies pain with walking. Denies swelling.   Review of Systems  Constitutional: Negative.   Respiratory: Negative for cough, chest tightness and shortness of breath.   Cardiovascular: Negative for chest pain, palpitations and leg swelling.  Gastrointestinal: Negative for abdominal distention, abdominal pain, constipation, diarrhea, nausea and vomiting.  Musculoskeletal: Positive for myalgias.  Skin: Positive for wound.  Neurological: Negative.   Psychiatric/Behavioral: Negative.       Objective:   Physical Exam  Constitutional: He is oriented to person, place, and time. He appears well-developed and well-nourished.  HENT:  Head: Normocephalic and atraumatic.  Eyes: EOM are normal.  Neck: Normal range of motion.  Cardiovascular: Normal rate and regular rhythm.  Pulmonary/Chest: Effort normal and breath sounds normal. No respiratory distress. He has no wheezes. He has no rales.  Abdominal: Soft. Bowel sounds are normal. He exhibits no distension. There is no tenderness. There is no rebound.  Musculoskeletal: He exhibits tenderness. He exhibits no edema.  Neurological: He is alert and oriented to person, place, and time.  Coordination normal.  Skin: Skin is warm and dry.  Wound on the right posterior calf 1 cm circular sore with scab on the base and rolled borders. Mild tender to touch, no calf tenderness or swelling.    Vitals:   05/20/18 0927  BP: (!) 140/100  Pulse: 63  Temp: (!) 97.5 F (36.4 C)  TempSrc: Oral  SpO2: 97%  Weight: 289 lb (131.1 kg)  Height: 6' 1.5" (1.867 m)      Assessment & Plan:

## 2018-05-20 NOTE — Assessment & Plan Note (Signed)
Offered wound clinic referral as it may need debridement to help it continue healing and he declines wanting to wait a couple of weeks to see if it will heal on its own. Unclear course over several months. No signs of infection today. Rx for gabapentin 300 mg qhs for pain.

## 2018-06-24 ENCOUNTER — Encounter (HOSPITAL_COMMUNITY): Payer: Self-pay | Admitting: Nurse Practitioner

## 2018-06-24 ENCOUNTER — Ambulatory Visit (HOSPITAL_COMMUNITY)
Admission: RE | Admit: 2018-06-24 | Discharge: 2018-06-24 | Disposition: A | Discharge status: Home / Self Care | Payer: BLUE CROSS/BLUE SHIELD | Source: Ambulatory Visit | Attending: Nurse Practitioner | Admitting: Nurse Practitioner

## 2018-06-24 VITALS — BP 126/4 | HR 74 | Ht 73.5 in | Wt 293.0 lb

## 2018-06-24 DIAGNOSIS — Z7951 Long term (current) use of inhaled steroids: Secondary | ICD-10-CM | POA: Insufficient documentation

## 2018-06-24 DIAGNOSIS — Z7984 Long term (current) use of oral hypoglycemic drugs: Secondary | ICD-10-CM | POA: Diagnosis not present

## 2018-06-24 DIAGNOSIS — E785 Hyperlipidemia, unspecified: Secondary | ICD-10-CM | POA: Insufficient documentation

## 2018-06-24 DIAGNOSIS — Z7901 Long term (current) use of anticoagulants: Secondary | ICD-10-CM | POA: Insufficient documentation

## 2018-06-24 DIAGNOSIS — G4730 Sleep apnea, unspecified: Secondary | ICD-10-CM | POA: Insufficient documentation

## 2018-06-24 DIAGNOSIS — I4819 Other persistent atrial fibrillation: Secondary | ICD-10-CM | POA: Diagnosis not present

## 2018-06-24 DIAGNOSIS — Z79899 Other long term (current) drug therapy: Secondary | ICD-10-CM | POA: Diagnosis not present

## 2018-06-24 DIAGNOSIS — Z8249 Family history of ischemic heart disease and other diseases of the circulatory system: Secondary | ICD-10-CM | POA: Insufficient documentation

## 2018-06-24 DIAGNOSIS — I428 Other cardiomyopathies: Secondary | ICD-10-CM | POA: Diagnosis not present

## 2018-06-24 DIAGNOSIS — I509 Heart failure, unspecified: Secondary | ICD-10-CM | POA: Diagnosis not present

## 2018-06-24 DIAGNOSIS — Z825 Family history of asthma and other chronic lower respiratory diseases: Secondary | ICD-10-CM | POA: Insufficient documentation

## 2018-06-24 DIAGNOSIS — Z841 Family history of disorders of kidney and ureter: Secondary | ICD-10-CM | POA: Insufficient documentation

## 2018-06-24 DIAGNOSIS — Q249 Congenital malformation of heart, unspecified: Secondary | ICD-10-CM | POA: Insufficient documentation

## 2018-06-24 DIAGNOSIS — I110 Hypertensive heart disease with heart failure: Secondary | ICD-10-CM | POA: Diagnosis not present

## 2018-06-24 DIAGNOSIS — Z6838 Body mass index (BMI) 38.0-38.9, adult: Secondary | ICD-10-CM | POA: Insufficient documentation

## 2018-06-24 NOTE — Progress Notes (Signed)
Primary Care Physician: [name], MD Referring Physician:Dr. Rayann Heman   [name] is a 63 y.o. male with a h/o persistent afib, s/p ablation 2012, previous cardioversions, HTN, CHF,that is in the afib clinic to f/u start of amiodarone 11/18 by Dr. Rayann Heman.[name] is currently in aflutter and is on 200 mg bid. He is surprised that he is still out of rhythm and is not  real happy about needing another cardioversion, which was Dr. Jackalyn Lombard plan. He does not have any  family and all his friends work so he will have to call back when  he finds someone to drive him for the cardioversion.  Today, he denies symptoms of palpitations, chest pain, shortness of breath, orthopnea, PND, lower extremity edema, dizziness, presyncope, syncope, or neurologic sequela. The patient is tolerating medications without difficulties and is otherwise without complaint today.   Past Medical History:  Diagnosis Date  . Allergy    SEASONAL  . Asthma   . Atrial fibrillation ([place])    persistant 02/2009  . Atrial flutter ([place])    s/p CTI ablation 08/06/10  . Cardiac arrhythmia due to congenital heart disease   . Cataract    BEGINING  . Chronic rhinitis   . Chronic systolic dysfunction of left ventricle   . Colon polyp   . Congestive heart failure ([place])   . Diverticulosis    colonoscopy 04/03/2009  . Hyperlipidemia   . Hypertension   . Hypertension   . Morbid obesity ([place])    target weight = 219  for BMI < 30  . Nonischemic cardiomyopathy ([facility])    tachycardia mediated  . Sleep apnea    original 2001   Past Surgical History:  Procedure Laterality Date  . atrial flutter ablation  08/06/10  . COLONOSCOPY      Current Outpatient Medications  Medication Sig Dispense Refill  . amiodarone (PACERONE) 200 MG tablet Take 1 tablet (200 mg total) by mouth 2 (two) times daily. 180 tablet 3  . carvedilol (COREG) 25 MG tablet TAKE 1 TABLET (25 MG TOTAL) BY MOUTH 2 (TWO) TIMES DAILY WITH A MEAL. 180 tablet  3  . furosemide (LASIX) 80 MG tablet Take 0.5-1 tablets (40-80 mg total) by mouth daily as needed for fluid or edema. 90 tablet 3  . gabapentin (NEURONTIN) 300 MG capsule Take 1 capsule (300 mg total) by mouth at bedtime. 90 capsule 0  . glipiZIDE (GLUCOTROL) 5 MG tablet TAKE 1 TABLET TWICE A DAY BEFORE MEALS 180 tablet 3  . KLOR-CON M20 20 MEQ tablet TAKE 1 TABLET BY MOUTH TWICE A DAY (Patient taking differently: TAKE 1 TABLET BY MOUTH TWICE A DAY every other day) 180 tablet 3  . losartan (COZAAR) 50 MG tablet Take 1 tablet (50 mg total) by mouth daily. 90 tablet 3  . mometasone (NASONEX) 50 MCG/ACT nasal spray Place 2 sprays into the nose daily as needed (allergies or rhinitis).     . rosuvastatin (CRESTOR) 10 MG tablet Take 1 tablet (10 mg total) by mouth daily. 90 tablet 3  . sildenafil (VIAGRA) 100 MG tablet Take 1 tablet (100 mg total) by mouth as directed. 15 tablet 3  . SYMBICORT 80-4.5 MCG/ACT inhaler USE 2 INHALATIONS TWICE A DAY 30.6 g 1  . XARELTO 20 MG TABS tablet TAKE 1 TABLET (20 MG TOTAL) BY MOUTH DAILY WITH SUPPER. 90 tablet 1  . methocarbamol (ROBAXIN) 500 MG tablet Take 1 tablet (500 mg total) by mouth 4 (four) times daily. (  Patient not taking: Reported on 06/24/2018) 30 tablet 1   No current facility-administered medications for this encounter.     No Known Allergies  Social History   Socioeconomic History  . Marital status: Single    Spouse name: Not on file  . Number of children: 0  . Years of education: Not on file  . Highest education level: Not on file  Occupational History  . Occupation: Press photographer  Social Needs  . Financial resource strain: Not on file  . Food insecurity:    Worry: Not on file    Inability: Not on file  . Transportation needs:    Medical: Not on file    Non-medical: Not on file  Tobacco Use  . Smoking status: Never Smoker  . Smokeless tobacco: Never Used  Substance and Sexual Activity  . Alcohol use: Yes    Alcohol/week: 0.0 standard  drinks    Comment: previously quite heavy, working on cessation   . Drug use: No  . Sexual activity: Not on file  Lifestyle  . Physical activity:    Days per week: Not on file    Minutes per session: Not on file  . Stress: Not on file  Relationships  . Social connections:    Talks on phone: Not on file    Gets together: Not on file    Attends religious service: Not on file    Active member of club or organization: Not on file    Attends meetings of clubs or organizations: Not on file    Relationship status: Not on file  . Intimate partner violence:    Fear of current or ex partner: Not on file    Emotionally abused: Not on file    Physically abused: Not on file    Forced sexual activity: Not on file  Other Topics Concern  . Not on file  Social History Narrative   Lives in [place] and works for [name]    Family History  Problem Relation Age of Onset  . Hypertension Mother   . Kidney failure Mother        Due to sepsis  . Colon cancer Father 40  . COPD Father   . Colon cancer Brother   . Colon polyps Neg Hx   . Diabetes Neg Hx   . Kidney disease Neg Hx   . Esophageal cancer Neg Hx     ROS- All systems are reviewed and negative except as per the HPI above  Physical Exam: Vitals:   06/24/18 1051  BP: (!) 126/4  Pulse: 74  SpO2: 97%  Weight: 132.9 kg  Height: 6' 1.5" (1.867 m)   Wt Readings from Last 3 Encounters:  06/24/18 132.9 kg  05/20/18 131.1 kg  05/03/18 126.3 kg    Labs: Lab Results  Component Value Date   NA 147 (H) 05/03/2018   K 4.0 05/03/2018   CL 101 05/03/2018   CO2 24 05/03/2018   GLUCOSE 113 (H) 05/03/2018   BUN 19 05/03/2018   CREATININE 1.55 (H) 05/03/2018   CALCIUM 9.3 05/03/2018   MG 1.9 05/03/2018   Lab Results  Component Value Date   INR 1.4 02/06/2010   Lab Results  Component Value Date   CHOL 148 11/26/2017   HDL 60.60 11/26/2017   LDLCALC 69 11/26/2017   TRIG 92.0 11/26/2017     GEN- The patient is well  appearing, alert and oriented x 3 today.   Head- normocephalic, atraumatic Eyes-  Sclera clear,  conjunctiva pink Ears- hearing intact Oropharynx- clear Neck- supple, no JVP Lymph- no cervical lymphadenopathy Lungs- Clear to ausculation bilaterally, normal work of breathing Heart- irregular rate and rhythm, no murmurs, rubs or gallops, PMI not laterally displaced GI- soft, NT, ND, + BS Extremities- no clubbing, cyanosis, or edema MS- no significant deformity or atrophy Skin- no rash or lesion Psych- euthymic mood, full affect Neuro- strength and sensation are intact  EKG-atrial flutter at 74 bpm, with variable AV block, RBBB, LAFB,  qrs int 168 ms, qtc 552 ms     Assessment and Plan: 1. Persistent afib Has been loading on amiodarone 200 mg x one month He is ready for cardioversion but pt is not yet ready to commit to  a date as he will have to secure transportation He will continue on current dose of amiodarone and let us know when he can secure transportation, will lower to 200 mg daily after cardioversion He will have labs am of cardioversion  Continue xarelto 20 mg daily, states no missed doses x 3 weeks  Will  await on word from  pt to schedule DCCV  Butch Penny C. Sincere Liuzzi, [facility] [address] [place], [place] [address] [phone]

## 2018-06-24 NOTE — H&P (View-Only) (Signed)
Primary Care Physician: [name], MD Referring Physician:Dr. Rayann Heman   [name] is a 63 y.o. male with a h/o persistent afib, s/p ablation 2012, previous cardioversions, HTN, CHF,that is in the afib clinic to f/u start of amiodarone 11/18 by Dr. Rayann Heman.[name] is currently in aflutter and is on 200 mg bid. He is surprised that he is still out of rhythm and is not  real happy about needing another cardioversion, which was Dr. Jackalyn Lombard plan. He does not have any  family and all his friends work so he will have to call back when  he finds someone to drive him for the cardioversion.  Today, he denies symptoms of palpitations, chest pain, shortness of breath, orthopnea, PND, lower extremity edema, dizziness, presyncope, syncope, or neurologic sequela. The patient is tolerating medications without difficulties and is otherwise without complaint today.   Past Medical History:  Diagnosis Date  . Allergy    SEASONAL  . Asthma   . Atrial fibrillation ([place])    persistant 02/2009  . Atrial flutter ([place])    s/p CTI ablation 08/06/10  . Cardiac arrhythmia due to congenital heart disease   . Cataract    BEGINING  . Chronic rhinitis   . Chronic systolic dysfunction of left ventricle   . Colon polyp   . Congestive heart failure ([place])   . Diverticulosis    colonoscopy 04/03/2009  . Hyperlipidemia   . Hypertension   . Hypertension   . Morbid obesity ([place])    target weight = 219  for BMI < 30  . Nonischemic cardiomyopathy ([facility])    tachycardia mediated  . Sleep apnea    original 2001   Past Surgical History:  Procedure Laterality Date  . atrial flutter ablation  08/06/10  . COLONOSCOPY      Current Outpatient Medications  Medication Sig Dispense Refill  . amiodarone (PACERONE) 200 MG tablet Take 1 tablet (200 mg total) by mouth 2 (two) times daily. 180 tablet 3  . carvedilol (COREG) 25 MG tablet TAKE 1 TABLET (25 MG TOTAL) BY MOUTH 2 (TWO) TIMES DAILY WITH A MEAL. 180 tablet  3  . furosemide (LASIX) 80 MG tablet Take 0.5-1 tablets (40-80 mg total) by mouth daily as needed for fluid or edema. 90 tablet 3  . gabapentin (NEURONTIN) 300 MG capsule Take 1 capsule (300 mg total) by mouth at bedtime. 90 capsule 0  . glipiZIDE (GLUCOTROL) 5 MG tablet TAKE 1 TABLET TWICE A DAY BEFORE MEALS 180 tablet 3  . KLOR-CON M20 20 MEQ tablet TAKE 1 TABLET BY MOUTH TWICE A DAY (Patient taking differently: TAKE 1 TABLET BY MOUTH TWICE A DAY every other day) 180 tablet 3  . losartan (COZAAR) 50 MG tablet Take 1 tablet (50 mg total) by mouth daily. 90 tablet 3  . mometasone (NASONEX) 50 MCG/ACT nasal spray Place 2 sprays into the nose daily as needed (allergies or rhinitis).     . rosuvastatin (CRESTOR) 10 MG tablet Take 1 tablet (10 mg total) by mouth daily. 90 tablet 3  . sildenafil (VIAGRA) 100 MG tablet Take 1 tablet (100 mg total) by mouth as directed. 15 tablet 3  . SYMBICORT 80-4.5 MCG/ACT inhaler USE 2 INHALATIONS TWICE A DAY 30.6 g 1  . XARELTO 20 MG TABS tablet TAKE 1 TABLET (20 MG TOTAL) BY MOUTH DAILY WITH SUPPER. 90 tablet 1  . methocarbamol (ROBAXIN) 500 MG tablet Take 1 tablet (500 mg total) by mouth 4 (four) times daily. (  Patient not taking: Reported on 06/24/2018) 30 tablet 1   No current facility-administered medications for this encounter.     No Known Allergies  Social History   Socioeconomic History  . Marital status: Single    Spouse name: Not on file  . Number of children: 0  . Years of education: Not on file  . Highest education level: Not on file  Occupational History  . Occupation: Press photographer  Social Needs  . Financial resource strain: Not on file  . Food insecurity:    Worry: Not on file    Inability: Not on file  . Transportation needs:    Medical: Not on file    Non-medical: Not on file  Tobacco Use  . Smoking status: Never Smoker  . Smokeless tobacco: Never Used  Substance and Sexual Activity  . Alcohol use: Yes    Alcohol/week: 0.0 standard  drinks    Comment: previously quite heavy, working on cessation   . Drug use: No  . Sexual activity: Not on file  Lifestyle  . Physical activity:    Days per week: Not on file    Minutes per session: Not on file  . Stress: Not on file  Relationships  . Social connections:    Talks on phone: Not on file    Gets together: Not on file    Attends religious service: Not on file    Active member of club or organization: Not on file    Attends meetings of clubs or organizations: Not on file    Relationship status: Not on file  . Intimate partner violence:    Fear of current or ex partner: Not on file    Emotionally abused: Not on file    Physically abused: Not on file    Forced sexual activity: Not on file  Other Topics Concern  . Not on file  Social History Narrative   Lives in [place] and works for [name]    Family History  Problem Relation Age of Onset  . Hypertension Mother   . Kidney failure Mother        Due to sepsis  . Colon cancer Father 30  . COPD Father   . Colon cancer Brother   . Colon polyps Neg Hx   . Diabetes Neg Hx   . Kidney disease Neg Hx   . Esophageal cancer Neg Hx     ROS- All systems are reviewed and negative except as per the HPI above  Physical Exam: Vitals:   06/24/18 1051  BP: (!) 126/4  Pulse: 74  SpO2: 97%  Weight: 132.9 kg  Height: 6' 1.5" (1.867 m)   Wt Readings from Last 3 Encounters:  06/24/18 132.9 kg  05/20/18 131.1 kg  05/03/18 126.3 kg    Labs: Lab Results  Component Value Date   NA 147 (H) 05/03/2018   K 4.0 05/03/2018   CL 101 05/03/2018   CO2 24 05/03/2018   GLUCOSE 113 (H) 05/03/2018   BUN 19 05/03/2018   CREATININE 1.55 (H) 05/03/2018   CALCIUM 9.3 05/03/2018   MG 1.9 05/03/2018   Lab Results  Component Value Date   INR 1.4 02/06/2010   Lab Results  Component Value Date   CHOL 148 11/26/2017   HDL 60.60 11/26/2017   LDLCALC 69 11/26/2017   TRIG 92.0 11/26/2017     GEN- The patient is well  appearing, alert and oriented x 3 today.   Head- normocephalic, atraumatic Eyes-  Sclera clear,  conjunctiva pink Ears- hearing intact Oropharynx- clear Neck- supple, no JVP Lymph- no cervical lymphadenopathy Lungs- Clear to ausculation bilaterally, normal work of breathing Heart- irregular rate and rhythm, no murmurs, rubs or gallops, PMI not laterally displaced GI- soft, NT, ND, + BS Extremities- no clubbing, cyanosis, or edema MS- no significant deformity or atrophy Skin- no rash or lesion Psych- euthymic mood, full affect Neuro- strength and sensation are intact  EKG-atrial flutter at 74 bpm, with variable AV block, RBBB, LAFB,  qrs int 168 ms, qtc 552 ms     Assessment and Plan: 1. Persistent afib Has been loading on amiodarone 200 mg x one month He is ready for cardioversion but pt is not yet ready to commit to  a date as he will have to secure transportation He will continue on current dose of amiodarone and let us know when he can secure transportation, will lower to 200 mg daily after cardioversion He will have labs am of cardioversion  Continue xarelto 20 mg daily, states no missed doses x 3 weeks  Will  await on word from  pt to schedule DCCV  Butch Penny C. Marshawn Ninneman, [facility] [address] [place], [place] [address] [phone]

## 2018-06-28 ENCOUNTER — Other Ambulatory Visit (HOSPITAL_COMMUNITY): Payer: Self-pay | Admitting: *Deleted

## 2018-07-01 ENCOUNTER — Ambulatory Visit (HOSPITAL_COMMUNITY)
Admission: RE | Admit: 2018-07-01 | Discharge: 2018-07-01 | Disposition: A | Discharge status: Home / Self Care | Payer: BLUE CROSS/BLUE SHIELD | Source: Ambulatory Visit | Attending: Nurse Practitioner | Admitting: Nurse Practitioner

## 2018-07-01 DIAGNOSIS — I5022 Chronic systolic (congestive) heart failure: Secondary | ICD-10-CM | POA: Insufficient documentation

## 2018-07-01 DIAGNOSIS — I4819 Other persistent atrial fibrillation: Secondary | ICD-10-CM | POA: Insufficient documentation

## 2018-07-01 LAB — CBC
HCT: 56.4 % — ABNORMAL HIGH (ref 39.0–52.0)
HEMOGLOBIN: 18.1 g/dL — AB (ref 13.0–17.0)
MCH: 28.9 pg (ref 26.0–34.0)
MCHC: 32.1 g/dL (ref 30.0–36.0)
MCV: 90 fL (ref 80.0–100.0)
Platelets: 160 10*3/uL (ref 150–400)
RBC: 6.27 MIL/uL — ABNORMAL HIGH (ref 4.22–5.81)
RDW: 17 % — ABNORMAL HIGH (ref 11.5–15.5)
WBC: 5.8 10*3/uL (ref 4.0–10.5)
nRBC: 0 % (ref 0.0–0.2)

## 2018-07-01 LAB — BASIC METABOLIC PANEL
Anion gap: 10 (ref 5–15)
BUN: 14 mg/dL (ref 8–23)
CHLORIDE: 98 mmol/L (ref 98–111)
CO2: 32 mmol/L (ref 22–32)
Calcium: 8.9 mg/dL (ref 8.9–10.3)
Creatinine, Ser: 1.76 mg/dL — ABNORMAL HIGH (ref 0.61–1.24)
GFR calc Af Amer: 47 mL/min — ABNORMAL LOW (ref >60)
GFR calc non Af Amer: 40 mL/min — ABNORMAL LOW (ref >60)
Glucose, Bld: 168 mg/dL — ABNORMAL HIGH (ref 70–99)
Potassium: 3.9 mmol/L (ref 3.5–5.1)
Sodium: 140 mmol/L (ref 135–145)

## 2018-07-04 NOTE — Anesthesia Preprocedure Evaluation (Addendum)
Anesthesia Evaluation  Patient identified by MRN, date of birth, ID band Patient awake    Reviewed: Allergy & Precautions, NPO status , Patient's Chart, lab work & pertinent test results  Airway Mallampati: II  TM Distance: >3 FB Neck ROM: Full    Dental no notable dental hx. (+) Dental Advisory Given, Partial Upper   Pulmonary asthma , sleep apnea ,    Pulmonary exam normal breath sounds clear to auscultation       Cardiovascular hypertension, Pt. on medications and Pt. on home beta blockers Normal cardiovascular exam+ dysrhythmias Atrial Fibrillation  Rhythm:Regular Rate:Normal  Echo 05/03/18 Left ventricle: The cavity size was normal. There was severe   concentric hypertrophy. Systolic function was moderately to   severely reduced. The estimated ejection fraction was in the   range of 30% to 35%. Diffuse hypokinesis. The study was not   technically sufficient to allow evaluation of LV diastolic   dysfunction due to atrial fibrillation. - Aortic valve: There was no regurgitation. - Aortic root: The aortic root was normal in size. - Mitral valve: There was trivial regurgitation. - Left atrium: The atrium was severely dilated. - Right ventricle: Systolic function was normal. - Right atrium: The atrium was moderately dilated. - Tricuspid valve: There was trivial regurgitation.   Neuro/Psych negative neurological ROS  negative psych ROS   GI/Hepatic negative GI ROS, Neg liver ROS,   Endo/Other  diabetes, Type 1  Renal/GU negative Renal ROS     Musculoskeletal negative musculoskeletal ROS (+)   Abdominal (+) + obese,   Peds  Hematology negative hematology ROS (+)   Anesthesia Other Findings   Reproductive/Obstetrics                          Anesthesia Physical Anesthesia Plan  ASA: IV  Anesthesia Plan: General   Post-op Pain Management:    Induction: Intravenous  PONV Risk Score  and Plan: Treatment may vary due to age or medical condition and Ondansetron  Airway Management Planned: Natural Airway and Nasal Cannula  Additional Equipment:   Intra-op Plan:   Post-operative Plan:   Informed Consent: I have reviewed the patients History and Physical, chart, labs and discussed the procedure including the risks, benefits and alternatives for the proposed anesthesia with the patient or authorized representative who has indicated his/her understanding and acceptance.     Dental advisory given  Plan Discussed with: CRNA  Anesthesia Plan Comments:        Anesthesia Quick Evaluation

## 2018-07-05 ENCOUNTER — Ambulatory Visit (HOSPITAL_COMMUNITY): Payer: BLUE CROSS/BLUE SHIELD | Admitting: Anesthesiology

## 2018-07-05 ENCOUNTER — Encounter (HOSPITAL_COMMUNITY): Payer: Self-pay | Admitting: *Deleted

## 2018-07-05 ENCOUNTER — Ambulatory Visit (HOSPITAL_COMMUNITY)
Admission: RE | Admit: 2018-07-05 | Discharge: 2018-07-05 | Disposition: A | Discharge status: Home / Self Care | Payer: BLUE CROSS/BLUE SHIELD | Attending: Cardiology | Admitting: Cardiology

## 2018-07-05 ENCOUNTER — Encounter (HOSPITAL_COMMUNITY)
Admission: RE | Disposition: A | Discharge status: Home / Self Care | Payer: Self-pay | Source: Home / Self Care | Attending: Cardiology

## 2018-07-05 ENCOUNTER — Other Ambulatory Visit: Payer: Self-pay

## 2018-07-05 DIAGNOSIS — I4819 Other persistent atrial fibrillation: Secondary | ICD-10-CM | POA: Diagnosis not present

## 2018-07-05 DIAGNOSIS — I4892 Unspecified atrial flutter: Secondary | ICD-10-CM

## 2018-07-05 DIAGNOSIS — Z8249 Family history of ischemic heart disease and other diseases of the circulatory system: Secondary | ICD-10-CM | POA: Insufficient documentation

## 2018-07-05 DIAGNOSIS — E039 Hypothyroidism, unspecified: Secondary | ICD-10-CM | POA: Diagnosis not present

## 2018-07-05 DIAGNOSIS — G4730 Sleep apnea, unspecified: Secondary | ICD-10-CM | POA: Diagnosis not present

## 2018-07-05 DIAGNOSIS — J45909 Unspecified asthma, uncomplicated: Secondary | ICD-10-CM | POA: Diagnosis not present

## 2018-07-05 DIAGNOSIS — E785 Hyperlipidemia, unspecified: Secondary | ICD-10-CM | POA: Diagnosis not present

## 2018-07-05 DIAGNOSIS — Z79899 Other long term (current) drug therapy: Secondary | ICD-10-CM | POA: Insufficient documentation

## 2018-07-05 DIAGNOSIS — Z841 Family history of disorders of kidney and ureter: Secondary | ICD-10-CM | POA: Insufficient documentation

## 2018-07-05 DIAGNOSIS — Z7951 Long term (current) use of inhaled steroids: Secondary | ICD-10-CM | POA: Insufficient documentation

## 2018-07-05 DIAGNOSIS — I4510 Unspecified right bundle-branch block: Secondary | ICD-10-CM | POA: Insufficient documentation

## 2018-07-05 DIAGNOSIS — I110 Hypertensive heart disease with heart failure: Secondary | ICD-10-CM | POA: Diagnosis not present

## 2018-07-05 DIAGNOSIS — Z6838 Body mass index (BMI) 38.0-38.9, adult: Secondary | ICD-10-CM | POA: Insufficient documentation

## 2018-07-05 DIAGNOSIS — I5022 Chronic systolic (congestive) heart failure: Secondary | ICD-10-CM | POA: Insufficient documentation

## 2018-07-05 DIAGNOSIS — Z7984 Long term (current) use of oral hypoglycemic drugs: Secondary | ICD-10-CM | POA: Insufficient documentation

## 2018-07-05 DIAGNOSIS — Z7901 Long term (current) use of anticoagulants: Secondary | ICD-10-CM | POA: Insufficient documentation

## 2018-07-05 HISTORY — PX: CARDIOVERSION: SHX1299

## 2018-07-05 LAB — GLUCOSE, CAPILLARY: Glucose-Capillary: 115 mg/dL — ABNORMAL HIGH (ref 70–99)

## 2018-07-05 SURGERY — CARDIOVERSION
Anesthesia: General

## 2018-07-05 MED ORDER — SODIUM CHLORIDE 0.9 % IV SOLN
INTRAVENOUS | Status: DC | PRN
  Administered 2018-07-05: 08:00:00 via INTRAVENOUS

## 2018-07-05 MED ORDER — PROPOFOL 10 MG/ML IV BOLUS
INTRAVENOUS | Status: DC | PRN
  Administered 2018-07-05: 100 mg via INTRAVENOUS

## 2018-07-05 MED ORDER — LIDOCAINE 2% (20 MG/ML) 5 ML SYRINGE
INTRAMUSCULAR | Status: DC | PRN
  Administered 2018-07-05: 100 mg via INTRAVENOUS

## 2018-07-05 NOTE — Anesthesia Procedure Notes (Signed)
Date/Time: 07/05/2018 7:36 AM Performed by: Trinna Post., CRNA Pre-anesthesia Checklist: Patient identified, Emergency Drugs available, Suction available, Patient being monitored and Timeout performed Patient Re-evaluated:Patient Re-evaluated prior to induction Oxygen Delivery Method: Ambu bag Preoxygenation: Pre-oxygenation with 100% oxygen Induction Type: IV induction Placement Confirmation: positive ETCO2

## 2018-07-05 NOTE — CV Procedure (Signed)
Procedure:   DCCV  Indication:  Symptomatic atrial flutter, history of afib  Procedure Note:  The patient signed informed consent.  He has had had therapeutic anticoagulation with rivaroxaban greater than 3 weeks.  Anesthesia was administered by Dr. Valma Cava.  Adequate airway was maintained throughout and vital followed per protocol.  He was cardioverted x 2 with 150 J then 200 J of biphasic synchronized energy.  He converted to NSR.  There were no apparent complications.  The patient had normal neuro status and respiratory status post procedure with vitals stable as recorded elsewhere.    Follow up:  He has follow up with cardiology.  He will continue on current medical therapy.    Buford Dresser, MD PhD 07/05/2018 8:42 AM

## 2018-07-05 NOTE — Transfer of Care (Signed)
Immediate Anesthesia Transfer of Care Note  Patient: [name]  Procedure(s) Performed: CARDIOVERSION (N/A )  Patient Location: PACU and Endoscopy Unit  Anesthesia Type:General  Level of Consciousness: awake and drowsy  Airway & Oxygen Therapy: Patient Spontanous Breathing and Patient connected to nasal cannula oxygen  Post-op Assessment: Report given to RN and Post -op Vital signs reviewed and stable  Post vital signs: Reviewed and stable  Last Vitals:  Vitals Value Taken Time  BP    Temp    Pulse    Resp    SpO2      Last Pain:  Vitals:   07/05/18 0740  TempSrc: Oral  PainSc: 0-No pain         Complications: No apparent anesthesia complications

## 2018-07-05 NOTE — Anesthesia Postprocedure Evaluation (Signed)
Anesthesia Post Note  Patient: [name]  Procedure(s) Performed: CARDIOVERSION (N/A )     Patient location during evaluation: Endoscopy Anesthesia Type: General Level of consciousness: awake and alert Pain management: pain level controlled Vital Signs Assessment: post-procedure vital signs reviewed and stable Respiratory status: spontaneous breathing, nonlabored ventilation, respiratory function stable and patient connected to nasal cannula oxygen Cardiovascular status: blood pressure returned to baseline and stable Postop Assessment: no apparent nausea or vomiting Anesthetic complications: no    Last Vitals:  Vitals:   07/05/18 0855 07/05/18 0905  BP: 135/79 133/67  Pulse: (!) 44 (!) 43  Resp: 15 12  Temp:    SpO2: 96% 98%    Last Pain:  Vitals:   07/05/18 0905  TempSrc:   PainSc: 0-No pain                 Barnet Glasgow

## 2018-07-05 NOTE — Discharge Instructions (Signed)
Electrical Cardioversion, Care After °This sheet gives you information about how to care for yourself after your procedure. Your health care provider may also give you more specific instructions. If you have problems or questions, contact your health care provider. °What can I expect after the procedure? °After the procedure, it is common to have: °· Some redness on the skin where the shocks were given. °Follow these instructions at home: ° °· Do not drive for 24 hours if you were given a medicine to help you relax (sedative). °· Take over-the-counter and prescription medicines only as told by your health care provider. °· Ask your health care provider how to check your pulse. Check it often. °· Rest for 48 hours after the procedure or as told by your health care provider. °· Avoid or limit your caffeine use as told by your health care provider. °Contact a health care provider if: °· You feel like your heart is beating too quickly or your pulse is not regular. °· You have a serious muscle cramp that does not go away. °Get help right away if: ° °· You have discomfort in your chest. °· You are dizzy or you feel faint. °· You have trouble breathing or you are short of breath. °· Your speech is slurred. °· You have trouble moving an arm or leg on one side of your body. °· Your fingers or toes turn cold or blue. °This information is not intended to replace advice given to you by your health care provider. Make sure you discuss any questions you have with your health care provider. °Document Released: 03/23/2013 Document Revised: 01/04/2016 Document Reviewed: 12/07/2015 °Elsevier Interactive Patient Education © 2019 [employer] ° °

## 2018-07-05 NOTE — Interval H&P Note (Signed)
History and Physical Interval Note:  07/05/2018 8:35 AM  [name]  has presented today for surgery, with the diagnosis of a fib  The various methods of treatment have been discussed with the patient and family. After consideration of risks, benefits and other options for treatment, the patient has consented to  Procedure(s): CARDIOVERSION (N/A) as a surgical intervention .  The patient's history has been reviewed, patient examined, no change in status, stable for surgery.  I have reviewed the patient's chart and labs.  Questions were answered to the patient's satisfaction.     Senan Urey Harrell Gave

## 2018-07-07 ENCOUNTER — Encounter (HOSPITAL_COMMUNITY): Payer: Self-pay | Admitting: Cardiology

## 2018-07-13 ENCOUNTER — Ambulatory Visit (HOSPITAL_COMMUNITY)
Admission: RE | Admit: 2018-07-13 | Discharge: 2018-07-13 | Disposition: A | Discharge status: Home / Self Care | Payer: BLUE CROSS/BLUE SHIELD | Source: Ambulatory Visit | Attending: Nurse Practitioner | Admitting: Nurse Practitioner

## 2018-07-13 ENCOUNTER — Encounter (HOSPITAL_COMMUNITY): Payer: Self-pay | Admitting: Nurse Practitioner

## 2018-07-13 VITALS — BP 130/82 | HR 47 | Ht 73.5 in | Wt 296.0 lb

## 2018-07-13 DIAGNOSIS — E785 Hyperlipidemia, unspecified: Secondary | ICD-10-CM | POA: Diagnosis not present

## 2018-07-13 DIAGNOSIS — Z79899 Other long term (current) drug therapy: Secondary | ICD-10-CM | POA: Diagnosis not present

## 2018-07-13 DIAGNOSIS — Z825 Family history of asthma and other chronic lower respiratory diseases: Secondary | ICD-10-CM | POA: Insufficient documentation

## 2018-07-13 DIAGNOSIS — Z8249 Family history of ischemic heart disease and other diseases of the circulatory system: Secondary | ICD-10-CM | POA: Diagnosis not present

## 2018-07-13 DIAGNOSIS — Z7984 Long term (current) use of oral hypoglycemic drugs: Secondary | ICD-10-CM | POA: Insufficient documentation

## 2018-07-13 DIAGNOSIS — I4819 Other persistent atrial fibrillation: Secondary | ICD-10-CM | POA: Diagnosis not present

## 2018-07-13 DIAGNOSIS — Z6838 Body mass index (BMI) 38.0-38.9, adult: Secondary | ICD-10-CM | POA: Insufficient documentation

## 2018-07-13 DIAGNOSIS — I428 Other cardiomyopathies: Secondary | ICD-10-CM | POA: Insufficient documentation

## 2018-07-13 DIAGNOSIS — Z7901 Long term (current) use of anticoagulants: Secondary | ICD-10-CM | POA: Insufficient documentation

## 2018-07-13 DIAGNOSIS — I110 Hypertensive heart disease with heart failure: Secondary | ICD-10-CM | POA: Insufficient documentation

## 2018-07-13 DIAGNOSIS — G4730 Sleep apnea, unspecified: Secondary | ICD-10-CM | POA: Insufficient documentation

## 2018-07-13 DIAGNOSIS — Z7951 Long term (current) use of inhaled steroids: Secondary | ICD-10-CM | POA: Diagnosis not present

## 2018-07-13 DIAGNOSIS — I509 Heart failure, unspecified: Secondary | ICD-10-CM | POA: Insufficient documentation

## 2018-07-13 DIAGNOSIS — J45909 Unspecified asthma, uncomplicated: Secondary | ICD-10-CM | POA: Diagnosis not present

## 2018-07-13 MED ORDER — CARVEDILOL 25 MG PO TABS: 12.5000 mg | ORAL_TABLET | Freq: Two times a day (BID) | ORAL | 3 refills | Status: DC

## 2018-07-13 MED ORDER — AMIODARONE HCL 200 MG PO TABS: 200.0000 mg | ORAL_TABLET | Freq: Every day | ORAL | 3 refills | Status: DC

## 2018-07-13 NOTE — Progress Notes (Signed)
Primary Care Physician: [name], MD Referring Physician:Dr. Rayann Heman   [name] is a 63 y.o. male with a h/o persistent afib, s/p ablation 2012, previous cardioversions, HTN, CHF,that is in the afib clinic to f/u start of amiodarone 11/18 by Dr. Rayann Heman. He is currently in aflutter and is on 200 mg bid. He is surprised that he is still out of rhythm and is not  real happy about needing another cardioversion, which was Dr. Jackalyn Lombard plan. He does not have any  family and all his friends work so he will have to call back when  he finds someone to drive him for the cardioversion.  F/u in afib clinic s/p successful cardioversion. He remains in SR. He feels improved. He will lower amio today to 200 mg a day. His HR is 47 bpm but is not symptomatic.    Today, he denies symptoms of palpitations, chest pain, shortness of breath, orthopnea, PND, lower extremity edema, dizziness, presyncope, syncope, or neurologic sequela. The patient is tolerating medications without difficulties and is otherwise without complaint today.   Past Medical History:  Diagnosis Date  . Allergy    SEASONAL  . Asthma   . Atrial fibrillation ([place])    persistant 02/2009  . Atrial flutter ([place])    s/p CTI ablation 08/06/10  . Cardiac arrhythmia due to congenital heart disease   . Cataract    BEGINING  . Chronic rhinitis   . Chronic systolic dysfunction of left ventricle   . Colon polyp   . Congestive heart failure ([place])   . Diverticulosis    colonoscopy 04/03/2009  . Hyperlipidemia   . Hypertension   . Hypertension   . Morbid obesity ([place])    target weight = 219  for BMI < 30  . Nonischemic cardiomyopathy ([facility])    tachycardia mediated  . Sleep apnea    original 2001   Past Surgical History:  Procedure Laterality Date  . atrial flutter ablation  08/06/10  . CARDIOVERSION N/A 07/05/2018   Procedure: CARDIOVERSION;  Surgeon: Buford Dresser, MD;  Location[facility] ENDOSCOPY;  Service:  Cardiovascular;  Laterality: N/A;  . COLONOSCOPY      Current Outpatient Medications  Medication Sig Dispense Refill  . albuterol (PROVENTIL HFA;VENTOLIN HFA) 108 (90 Base) MCG/ACT inhaler Inhale 2 puffs into the lungs every 6 (six) hours as needed for wheezing or shortness of breath.    [name] amiodarone (PACERONE) 200 MG tablet Take 1 tablet (200 mg total) by mouth daily. 180 tablet 3  . carvedilol (COREG) 25 MG tablet TAKE 1 TABLET (25 MG TOTAL) BY MOUTH 2 (TWO) TIMES DAILY WITH A MEAL. 180 tablet 3  . furosemide (LASIX) 80 MG tablet Take 0.5-1 tablets (40-80 mg total) by mouth daily as needed for fluid or edema. 90 tablet 3  . gabapentin (NEURONTIN) 300 MG capsule Take 1 capsule (300 mg total) by mouth at bedtime. 90 capsule 0  . glipiZIDE (GLUCOTROL) 5 MG tablet TAKE 1 TABLET TWICE A DAY BEFORE MEALS 180 tablet 3  . KLOR-CON M20 20 MEQ tablet TAKE 1 TABLET BY MOUTH TWICE A DAY 180 tablet 3  . losartan (COZAAR) 50 MG tablet Take 1 tablet (50 mg total) by mouth daily. 90 tablet 3  . methocarbamol (ROBAXIN) 500 MG tablet Take 1 tablet (500 mg total) by mouth 4 (four) times daily. 30 tablet 1  . mometasone (NASONEX) 50 MCG/ACT nasal spray Place 2 sprays into the nose daily as needed (allergies or rhinitis).     [name]  rosuvastatin (CRESTOR) 10 MG tablet Take 1 tablet (10 mg total) by mouth daily. 90 tablet 3  . sildenafil (VIAGRA) 100 MG tablet Take 1 tablet (100 mg total) by mouth as directed. 15 tablet 3  . SYMBICORT 80-4.5 MCG/ACT inhaler USE 2 INHALATIONS TWICE A DAY 30.6 g 1  . XARELTO 20 MG TABS tablet TAKE 1 TABLET (20 MG TOTAL) BY MOUTH DAILY WITH SUPPER. 90 tablet 1   No current facility-administered medications for this encounter.     No Known Allergies  Social History   Socioeconomic History  . Marital status: Single    Spouse name: Not on file  . Number of children: 0  . Years of education: Not on file  . Highest education level: Not on file  Occupational History  . Occupation:  Press photographer  Social Needs  . Financial resource strain: Not on file  . Food insecurity:    Worry: Not on file    Inability: Not on file  . Transportation needs:    Medical: Not on file    Non-medical: Not on file  Tobacco Use  . Smoking status: Never Smoker  . Smokeless tobacco: Never Used  Substance and Sexual Activity  . Alcohol use: Yes    Alcohol/week: 0.0 standard drinks    Comment: previously quite heavy, working on cessation   . Drug use: No  . Sexual activity: Not on file  Lifestyle  . Physical activity:    Days per week: Not on file    Minutes per session: Not on file  . Stress: Not on file  Relationships  . Social connections:    Talks on phone: Not on file    Gets together: Not on file    Attends religious service: Not on file    Active member of club or organization: Not on file    Attends meetings of clubs or organizations: Not on file    Relationship status: Not on file  . Intimate partner violence:    Fear of current or ex partner: Not on file    Emotionally abused: Not on file    Physically abused: Not on file    Forced sexual activity: Not on file  Other Topics Concern  . Not on file  Social History Narrative   Lives in [place] and works for [name]    Family History  Problem Relation Age of Onset  . Hypertension Mother   . Kidney failure Mother        Due to sepsis  . Colon cancer Father 78  . COPD Father   . Colon cancer Brother   . Colon polyps Neg Hx   . Diabetes Neg Hx   . Kidney disease Neg Hx   . Esophageal cancer Neg Hx     ROS- All systems are reviewed and negative except as per the HPI above  Physical Exam: Vitals:   07/13/18 1352  BP: 130/82  Pulse: (!) 47  Weight: 134.3 kg  Height: 6' 1.5" (1.867 m)   Wt Readings from Last 3 Encounters:  07/13/18 134.3 kg  06/24/18 132.9 kg  05/20/18 131.1 kg    Labs: Lab Results  Component Value Date   NA 140 07/01/2018   K 3.9 07/01/2018   CL 98 07/01/2018   CO2 32  07/01/2018   GLUCOSE 168 (H) 07/01/2018   BUN 14 07/01/2018   CREATININE 1.76 (H) 07/01/2018   CALCIUM 8.9 07/01/2018   MG 1.9 05/03/2018   Lab Results  Component  Value Date   INR 1.4 02/06/2010   Lab Results  Component Value Date   CHOL 148 11/26/2017   HDL 60.60 11/26/2017   LDLCALC 69 11/26/2017   TRIG 92.0 11/26/2017     GEN- The patient is well appearing, alert and oriented x 3 today.   Head- normocephalic, atraumatic Eyes-  Sclera clear, conjunctiva pink Ears- hearing intact Oropharynx- clear Neck- supple, no JVP Lymph- no cervical lymphadenopathy Lungs- Clear to ausculation bilaterally, normal work of breathing Heart- regular rate and rhythm, no murmurs, rubs or gallops, PMI not laterally displaced GI- soft, NT, ND, + BS Extremities- no clubbing, cyanosis, or edema MS- no significant deformity or atrophy Skin- no rash or lesion Psych- euthymic mood, full affect Neuro- strength and sensation are intact  EKG-sinus brady at 47 bpm, RBBB, LAFB, first degree av block, pr int 232 ms, qrs int 158 ms, qtc 515 ms    Assessment and Plan: 1. Persistent afib  Successful cardioversion  Staying in SR Ekg reviewed with Dr. Rayann Heman Will reduce amiodarone to 200 mg daily and also reduce carvedilol to 12.5 mg bid for interval changes/brady in SR   Continue xarelto 20 mg daily, reminded not to miss anticoagulation   Will return for EKG/BP check in 2 weeks   Butch Penny C. Carroll, [facility] [address] [place], [place] [address] [phone]

## 2018-07-13 NOTE — Patient Instructions (Signed)
Decrease Amiodarone to 1 tablet 200 mg, once daily  Decrease Carvedilol to 1/2 tablet 12.5 mg twice a day

## 2018-07-20 ENCOUNTER — Ambulatory Visit (INDEPENDENT_AMBULATORY_CARE_PROVIDER_SITE_OTHER): Payer: BLUE CROSS/BLUE SHIELD | Admitting: Internal Medicine

## 2018-07-20 ENCOUNTER — Encounter: Payer: Self-pay | Admitting: Internal Medicine

## 2018-07-20 VITALS — BP 150/100 | HR 91 | Temp 97.8°F | Ht 73.5 in | Wt 295.0 lb

## 2018-07-20 DIAGNOSIS — L97202 Non-pressure chronic ulcer of unspecified calf with fat layer exposed: Secondary | ICD-10-CM

## 2018-07-20 DIAGNOSIS — E11622 Type 2 diabetes mellitus with other skin ulcer: Secondary | ICD-10-CM

## 2018-07-20 MED ORDER — GABAPENTIN 300 MG PO CAPS: 300.0000 mg | ORAL_CAPSULE | Freq: Three times a day (TID) | ORAL | 3 refills | Status: DC

## 2018-07-20 MED ORDER — SULFAMETHOXAZOLE-TRIMETHOPRIM 800-160 MG PO TABS: 1.0000 | ORAL_TABLET | Freq: Two times a day (BID) | ORAL | 0 refills | Status: DC

## 2018-07-20 NOTE — Assessment & Plan Note (Signed)
Into the fat layer at this time. Appears to have superficial cellulitis and rx for bactrim 2 weeks. Referral to wound clinic and may need debridement to help assist healing as it appears to have some rolling at the borders.

## 2018-07-20 NOTE — Patient Instructions (Signed)
We have refilled the gabapentin to take up to 3 times a day.  We have sent in bactrim to take 1 pill twice a day for 2 weeks.   We will get you in with the wound clinic to get this taken care of.

## 2018-07-20 NOTE — Progress Notes (Signed)
   Subjective:   Patient ID: [name], male    DOB: 10-May-1956, 63 y.o.   MRN: [id number]  HPI The patient is a 63 YO man coming in for follow up and worsening of wound on leg. Started about September or October. He was seen in December and elected to try conservative management and gabapentin for pain. He is still having shooting pains in the area especially at night time. Worsening over the last 2 months. He denies any improvement to the wound. He does scratch sometimes at night he thinks. Some itching in the area. Stable drainage and using antibiotic ointment on it. Denies fevers or chills. Some redness around the wound now. He cannot see it well.   Review of Systems  Constitutional: Negative.   HENT: Negative.   Eyes: Negative.   Respiratory: Negative for cough, chest tightness and shortness of breath.   Cardiovascular: Negative for chest pain, palpitations and leg swelling.  Gastrointestinal: Negative for abdominal distention, abdominal pain, constipation, diarrhea, nausea and vomiting.  Musculoskeletal: Positive for myalgias.  Skin: Positive for color change and wound.  Neurological: Negative.   Psychiatric/Behavioral: Negative.     Objective:  Physical Exam Constitutional:      Appearance: He is well-developed.  HENT:     Head: Normocephalic and atraumatic.  Neck:     Musculoskeletal: Normal range of motion.  Cardiovascular:     Rate and Rhythm: Normal rate and regular rhythm.  Pulmonary:     Effort: Pulmonary effort is normal. No respiratory distress.     Breath sounds: Normal breath sounds. No wheezing or rales.  Abdominal:     General: Bowel sounds are normal. There is no distension.     Palpations: Abdomen is soft.     Tenderness: There is no abdominal tenderness. There is no rebound.  Skin:    General: Skin is warm and dry.     Comments: Wound right calf with skin and fat breakdown about 2-3 cm diameter and circular. With surrounding erythema and heat. Worse  than prior. Size is similar but no interval healing.   Neurological:     Mental Status: He is alert and oriented to person, place, and time.     Coordination: Coordination normal.     Vitals:   07/20/18 1532  BP: (!) 150/100  Pulse: 91  Temp: 97.8 F (36.6 C)  TempSrc: Oral  SpO2: 98%  Weight: 295 lb (133.8 kg)  Height: 6' 1.5" (1.867 m)    Assessment & Plan:  Visit time 25 minutes: greater than 50% of that time was spent in face to face counseling and coordination of care with the patient: counseled about wound care and diabetic wounds, counseling about options and treatment.

## 2018-07-21 ENCOUNTER — Encounter: Payer: Self-pay | Admitting: Internal Medicine

## 2018-07-27 ENCOUNTER — Encounter (HOSPITAL_COMMUNITY): Payer: Self-pay | Admitting: Nurse Practitioner

## 2018-07-27 ENCOUNTER — Other Ambulatory Visit (HOSPITAL_COMMUNITY): Payer: Self-pay | Admitting: *Deleted

## 2018-07-27 ENCOUNTER — Ambulatory Visit (HOSPITAL_COMMUNITY)
Admission: RE | Admit: 2018-07-27 | Discharge: 2018-07-27 | Disposition: A | Discharge status: Home / Self Care | Payer: BLUE CROSS/BLUE SHIELD | Source: Ambulatory Visit | Attending: Nurse Practitioner | Admitting: Nurse Practitioner

## 2018-07-27 VITALS — BP 156/90 | HR 63 | Ht 73.5 in | Wt 298.0 lb

## 2018-07-27 DIAGNOSIS — I428 Other cardiomyopathies: Secondary | ICD-10-CM | POA: Insufficient documentation

## 2018-07-27 DIAGNOSIS — J45909 Unspecified asthma, uncomplicated: Secondary | ICD-10-CM | POA: Insufficient documentation

## 2018-07-27 DIAGNOSIS — I4819 Other persistent atrial fibrillation: Secondary | ICD-10-CM | POA: Insufficient documentation

## 2018-07-27 DIAGNOSIS — I484 Atypical atrial flutter: Secondary | ICD-10-CM

## 2018-07-27 DIAGNOSIS — Z79899 Other long term (current) drug therapy: Secondary | ICD-10-CM | POA: Insufficient documentation

## 2018-07-27 DIAGNOSIS — Z8249 Family history of ischemic heart disease and other diseases of the circulatory system: Secondary | ICD-10-CM | POA: Diagnosis not present

## 2018-07-27 DIAGNOSIS — R000 Tachycardia, unspecified: Secondary | ICD-10-CM | POA: Insufficient documentation

## 2018-07-27 DIAGNOSIS — I499 Cardiac arrhythmia, unspecified: Secondary | ICD-10-CM | POA: Diagnosis not present

## 2018-07-27 DIAGNOSIS — E785 Hyperlipidemia, unspecified: Secondary | ICD-10-CM | POA: Insufficient documentation

## 2018-07-27 DIAGNOSIS — I509 Heart failure, unspecified: Secondary | ICD-10-CM | POA: Insufficient documentation

## 2018-07-27 DIAGNOSIS — Z7901 Long term (current) use of anticoagulants: Secondary | ICD-10-CM | POA: Diagnosis not present

## 2018-07-27 DIAGNOSIS — I4892 Unspecified atrial flutter: Secondary | ICD-10-CM | POA: Diagnosis not present

## 2018-07-27 NOTE — Progress Notes (Signed)
Primary Care Physician: [name], MD Referring Physician:Dr. Rayann Heman   [name] is a 63 y.o. male with a h/o persistent afib, s/p ablation 2012, previous cardioversions, HTN, CHF,that is in the afib clinic to f/u amiodarone load. On last visit, he had a successful cardioversion. He can not tell when he is in or out of rhythm. However, he had sinus brady at 47 bpm, with first degree AV block as well as bifascicular block and I decreased amiodarone to 200 mg daily and reduced carvedilol.  He returns to clinic today, 07/27/18, in atrial flutter with variable block, controlled v rates at 63 bpm.  He is unaware.  Today, he denies symptoms of palpitations, chest pain, shortness of breath, orthopnea, PND, lower extremity edema, dizziness, presyncope, syncope, or neurologic sequela. The patient is tolerating medications without difficulties and is otherwise without complaint today.   Past Medical History:  Diagnosis Date  . Allergy    SEASONAL  . Asthma   . Atrial fibrillation ([place])    persistant 02/2009  . Atrial flutter ([place])    s/p CTI ablation 08/06/10  . Cardiac arrhythmia due to congenital heart disease   . Cataract    BEGINING  . Chronic rhinitis   . Chronic systolic dysfunction of left ventricle   . Colon polyp   . Congestive heart failure ([place])   . Diverticulosis    colonoscopy 04/03/2009  . Hyperlipidemia   . Hypertension   . Hypertension   . Morbid obesity ([place])    target weight = 219  for BMI < 30  . Nonischemic cardiomyopathy ([facility])    tachycardia mediated  . Sleep apnea    original 2001   Past Surgical History:  Procedure Laterality Date  . atrial flutter ablation  08/06/10  . CARDIOVERSION N/A 07/05/2018   Procedure: CARDIOVERSION;  Surgeon: Buford Dresser, MD;  Location: [facility] ENDOSCOPY;  Service: Cardiovascular;  Laterality: N/A;  . COLONOSCOPY      Current Outpatient Medications  Medication Sig Dispense Refill  . albuterol (PROVENTIL  HFA;VENTOLIN HFA) 108 (90 Base) MCG/ACT inhaler Inhale 2 puffs into the lungs every 6 (six) hours as needed for wheezing or shortness of breath.    [name] amiodarone (PACERONE) 200 MG tablet Take 1 tablet (200 mg total) by mouth daily. 180 tablet 3  . carvedilol (COREG) 25 MG tablet Take 0.5 tablets (12.5 mg total) by mouth 2 (two) times daily with a meal. 180 tablet 3  . furosemide (LASIX) 80 MG tablet Take 0.5-1 tablets (40-80 mg total) by mouth daily as needed for fluid or edema. 90 tablet 3  . gabapentin (NEURONTIN) 300 MG capsule Take 1 capsule (300 mg total) by mouth 3 (three) times daily. 90 capsule 3  . glipiZIDE (GLUCOTROL) 5 MG tablet TAKE 1 TABLET TWICE A DAY BEFORE MEALS 180 tablet 3  . KLOR-CON M20 20 MEQ tablet TAKE 1 TABLET BY MOUTH TWICE A DAY 180 tablet 3  . losartan (COZAAR) 50 MG tablet Take 1 tablet (50 mg total) by mouth daily. 90 tablet 3  . methocarbamol (ROBAXIN) 500 MG tablet Take 1 tablet (500 mg total) by mouth 4 (four) times daily. 30 tablet 1  . mometasone (NASONEX) 50 MCG/ACT nasal spray Place 2 sprays into the nose daily as needed (allergies or rhinitis).     . rosuvastatin (CRESTOR) 10 MG tablet Take 1 tablet (10 mg total) by mouth daily. 90 tablet 3  . sildenafil (VIAGRA) 100 MG tablet Take 1 tablet (100 mg  total) by mouth as directed. 15 tablet 3  . sulfamethoxazole-trimethoprim (BACTRIM DS,SEPTRA DS) 800-160 MG tablet Take 1 tablet by mouth 2 (two) times daily. 14 tablet 0  . SYMBICORT 80-4.5 MCG/ACT inhaler USE 2 INHALATIONS TWICE A DAY 30.6 g 1  . XARELTO 20 MG TABS tablet TAKE 1 TABLET (20 MG TOTAL) BY MOUTH DAILY WITH SUPPER. 90 tablet 1   No current facility-administered medications for this encounter.     No Known Allergies  Social History   Socioeconomic History  . Marital status: Single    Spouse name: Not on file  . Number of children: 0  . Years of education: Not on file  . Highest education level: Not on file  Occupational History  . Occupation:  Press photographer  Social Needs  . Financial resource strain: Not on file  . Food insecurity:    Worry: Not on file    Inability: Not on file  . Transportation needs:    Medical: Not on file    Non-medical: Not on file  Tobacco Use  . Smoking status: Never Smoker  . Smokeless tobacco: Never Used  Substance and Sexual Activity  . Alcohol use: Yes    Alcohol/week: 0.0 standard drinks    Comment: previously quite heavy, working on cessation   . Drug use: No  . Sexual activity: Not on file  Lifestyle  . Physical activity:    Days per week: Not on file    Minutes per session: Not on file  . Stress: Not on file  Relationships  . Social connections:    Talks on phone: Not on file    Gets together: Not on file    Attends religious service: Not on file    Active member of club or organization: Not on file    Attends meetings of clubs or organizations: Not on file    Relationship status: Not on file  . Intimate partner violence:    Fear of current or ex partner: Not on file    Emotionally abused: Not on file    Physically abused: Not on file    Forced sexual activity: Not on file  Other Topics Concern  . Not on file  Social History Narrative   Lives in [place] and works for [name]    Family History  Problem Relation Age of Onset  . Hypertension Mother   . Kidney failure Mother        Due to sepsis  . Colon cancer Father 31  . COPD Father   . Colon cancer Brother   . Colon polyps Neg Hx   . Diabetes Neg Hx   . Kidney disease Neg Hx   . Esophageal cancer Neg Hx     ROS- All systems are reviewed and negative except as per the HPI above  Physical Exam: Vitals:   07/27/18 1346  BP: (!) 156/90  Pulse: 63  Weight: 135.2 kg  Height: 6' 1.5" (1.867 m)   Wt Readings from Last 3 Encounters:  07/27/18 135.2 kg  07/20/18 133.8 kg  07/13/18 134.3 kg    Labs: Lab Results  Component Value Date   NA 140 07/01/2018   K 3.9 07/01/2018   CL 98 07/01/2018   CO2 32  07/01/2018   GLUCOSE 168 (H) 07/01/2018   BUN 14 07/01/2018   CREATININE 1.76 (H) 07/01/2018   CALCIUM 8.9 07/01/2018   MG 1.9 05/03/2018   Lab Results  Component Value Date   INR 1.4 02/06/2010  Lab Results  Component Value Date   CHOL 148 11/26/2017   HDL 60.60 11/26/2017   LDLCALC 69 11/26/2017   TRIG 92.0 11/26/2017     GEN- The patient is well appearing, alert and oriented x 3 today.   Head- normocephalic, atraumatic Eyes-  Sclera clear, conjunctiva pink Ears- hearing intact Oropharynx- clear Neck- supple, no JVP Lymph- no cervical lymphadenopathy Lungs- Clear to ausculation bilaterally, normal work of breathing Heart- irregular rate and rhythm, no murmurs, rubs or gallops, PMI not laterally displaced GI- soft, NT, ND, + BS Extremities- no clubbing, cyanosis, or edema MS- no significant deformity or atrophy Skin- no rash or lesion Psych- euthymic mood, full affect Neuro- strength and sensation are intact  EKG- atrial flutter with variable AV block at 63 bpm, RBBB, LAFB, Bifascicular block    Assessment and Plan: 1. Persistent afib Successful cardioversion but ERAF Pt is unaware He is very disappointed in the news I offered to reload amiodarone and try to cardiovert in another couple of weeks Pt states that he is now receiving the bills for his last cardioversion and prefers not to do that He wants to discuss the next step with Dr. Rayann Heman, he had previously mentioned that he may be a candidate for repeat ablation Continue xarelto 20 mg daily, reminded not to miss anticoagulation   I will ask for f/u appointment with Dr. Rayann Heman to further discuss  Butch Penny C. Tayten Bergdoll, [facility] [address] [place], [place] [address] [phone]

## 2018-07-27 NOTE — Telephone Encounter (Signed)
error 

## 2018-07-29 ENCOUNTER — Other Ambulatory Visit (HOSPITAL_BASED_OUTPATIENT_CLINIC_OR_DEPARTMENT_OTHER): Payer: Self-pay | Admitting: Internal Medicine

## 2018-07-29 ENCOUNTER — Encounter (HOSPITAL_BASED_OUTPATIENT_CLINIC_OR_DEPARTMENT_OTHER): Payer: BLUE CROSS/BLUE SHIELD | Attending: Internal Medicine

## 2018-07-29 DIAGNOSIS — I87321 Chronic venous hypertension (idiopathic) with inflammation of right lower extremity: Secondary | ICD-10-CM | POA: Insufficient documentation

## 2018-07-29 DIAGNOSIS — G4730 Sleep apnea, unspecified: Secondary | ICD-10-CM | POA: Insufficient documentation

## 2018-07-29 DIAGNOSIS — L97212 Non-pressure chronic ulcer of right calf with fat layer exposed: Secondary | ICD-10-CM | POA: Insufficient documentation

## 2018-07-29 DIAGNOSIS — Z7984 Long term (current) use of oral hypoglycemic drugs: Secondary | ICD-10-CM | POA: Insufficient documentation

## 2018-07-29 DIAGNOSIS — B965 Pseudomonas (aeruginosa) (mallei) (pseudomallei) as the cause of diseases classified elsewhere: Secondary | ICD-10-CM | POA: Insufficient documentation

## 2018-07-29 DIAGNOSIS — I509 Heart failure, unspecified: Secondary | ICD-10-CM | POA: Insufficient documentation

## 2018-07-29 DIAGNOSIS — I110 Hypertensive heart disease with heart failure: Secondary | ICD-10-CM | POA: Insufficient documentation

## 2018-07-29 DIAGNOSIS — B9561 Methicillin susceptible Staphylococcus aureus infection as the cause of diseases classified elsewhere: Secondary | ICD-10-CM | POA: Diagnosis not present

## 2018-07-29 DIAGNOSIS — M7989 Other specified soft tissue disorders: Secondary | ICD-10-CM | POA: Diagnosis not present

## 2018-07-29 DIAGNOSIS — E11622 Type 2 diabetes mellitus with other skin ulcer: Secondary | ICD-10-CM | POA: Insufficient documentation

## 2018-07-29 DIAGNOSIS — I87311 Chronic venous hypertension (idiopathic) with ulcer of right lower extremity: Secondary | ICD-10-CM | POA: Diagnosis not present

## 2018-07-30 ENCOUNTER — Other Ambulatory Visit (HOSPITAL_COMMUNITY)
Admission: RE | Admit: 2018-07-30 | Discharge: 2018-07-30 | Disposition: A | Discharge status: Home / Self Care | Payer: BLUE CROSS/BLUE SHIELD | Source: Ambulatory Visit | Attending: Internal Medicine | Admitting: Internal Medicine

## 2018-07-30 DIAGNOSIS — L97212 Non-pressure chronic ulcer of right calf with fat layer exposed: Secondary | ICD-10-CM | POA: Diagnosis not present

## 2018-08-02 DIAGNOSIS — B9561 Methicillin susceptible Staphylococcus aureus infection as the cause of diseases classified elsewhere: Secondary | ICD-10-CM | POA: Diagnosis not present

## 2018-08-02 DIAGNOSIS — Z7984 Long term (current) use of oral hypoglycemic drugs: Secondary | ICD-10-CM | POA: Diagnosis not present

## 2018-08-02 DIAGNOSIS — I110 Hypertensive heart disease with heart failure: Secondary | ICD-10-CM | POA: Diagnosis not present

## 2018-08-02 DIAGNOSIS — I509 Heart failure, unspecified: Secondary | ICD-10-CM | POA: Diagnosis not present

## 2018-08-02 DIAGNOSIS — B965 Pseudomonas (aeruginosa) (mallei) (pseudomallei) as the cause of diseases classified elsewhere: Secondary | ICD-10-CM | POA: Diagnosis not present

## 2018-08-02 DIAGNOSIS — E11622 Type 2 diabetes mellitus with other skin ulcer: Secondary | ICD-10-CM | POA: Diagnosis not present

## 2018-08-02 DIAGNOSIS — L97212 Non-pressure chronic ulcer of right calf with fat layer exposed: Secondary | ICD-10-CM | POA: Diagnosis not present

## 2018-08-02 DIAGNOSIS — G4730 Sleep apnea, unspecified: Secondary | ICD-10-CM | POA: Diagnosis not present

## 2018-08-02 DIAGNOSIS — I87321 Chronic venous hypertension (idiopathic) with inflammation of right lower extremity: Secondary | ICD-10-CM | POA: Diagnosis not present

## 2018-08-02 LAB — AEROBIC CULTURE W GRAM STAIN (SUPERFICIAL SPECIMEN)

## 2018-08-02 LAB — AEROBIC CULTURE  (SUPERFICIAL SPECIMEN)

## 2018-08-05 DIAGNOSIS — E11622 Type 2 diabetes mellitus with other skin ulcer: Secondary | ICD-10-CM | POA: Diagnosis not present

## 2018-08-05 DIAGNOSIS — G4730 Sleep apnea, unspecified: Secondary | ICD-10-CM | POA: Diagnosis not present

## 2018-08-05 DIAGNOSIS — I87311 Chronic venous hypertension (idiopathic) with ulcer of right lower extremity: Secondary | ICD-10-CM | POA: Diagnosis not present

## 2018-08-05 DIAGNOSIS — B9561 Methicillin susceptible Staphylococcus aureus infection as the cause of diseases classified elsewhere: Secondary | ICD-10-CM | POA: Diagnosis not present

## 2018-08-05 DIAGNOSIS — I87321 Chronic venous hypertension (idiopathic) with inflammation of right lower extremity: Secondary | ICD-10-CM | POA: Diagnosis not present

## 2018-08-05 DIAGNOSIS — I110 Hypertensive heart disease with heart failure: Secondary | ICD-10-CM | POA: Diagnosis not present

## 2018-08-05 DIAGNOSIS — I509 Heart failure, unspecified: Secondary | ICD-10-CM | POA: Diagnosis not present

## 2018-08-05 DIAGNOSIS — B965 Pseudomonas (aeruginosa) (mallei) (pseudomallei) as the cause of diseases classified elsewhere: Secondary | ICD-10-CM | POA: Diagnosis not present

## 2018-08-05 DIAGNOSIS — Z7984 Long term (current) use of oral hypoglycemic drugs: Secondary | ICD-10-CM | POA: Diagnosis not present

## 2018-08-05 DIAGNOSIS — L97212 Non-pressure chronic ulcer of right calf with fat layer exposed: Secondary | ICD-10-CM | POA: Diagnosis not present

## 2018-08-11 ENCOUNTER — Other Ambulatory Visit: Payer: Self-pay | Admitting: Internal Medicine

## 2018-08-12 ENCOUNTER — Other Ambulatory Visit: Payer: Self-pay | Admitting: Internal Medicine

## 2018-08-12 DIAGNOSIS — E11622 Type 2 diabetes mellitus with other skin ulcer: Secondary | ICD-10-CM | POA: Diagnosis not present

## 2018-08-12 DIAGNOSIS — Z7984 Long term (current) use of oral hypoglycemic drugs: Secondary | ICD-10-CM | POA: Diagnosis not present

## 2018-08-12 DIAGNOSIS — I87311 Chronic venous hypertension (idiopathic) with ulcer of right lower extremity: Secondary | ICD-10-CM | POA: Diagnosis not present

## 2018-08-12 DIAGNOSIS — I87321 Chronic venous hypertension (idiopathic) with inflammation of right lower extremity: Secondary | ICD-10-CM | POA: Diagnosis not present

## 2018-08-12 DIAGNOSIS — G4730 Sleep apnea, unspecified: Secondary | ICD-10-CM | POA: Diagnosis not present

## 2018-08-12 DIAGNOSIS — I110 Hypertensive heart disease with heart failure: Secondary | ICD-10-CM | POA: Diagnosis not present

## 2018-08-12 DIAGNOSIS — I509 Heart failure, unspecified: Secondary | ICD-10-CM | POA: Diagnosis not present

## 2018-08-12 DIAGNOSIS — L97212 Non-pressure chronic ulcer of right calf with fat layer exposed: Secondary | ICD-10-CM | POA: Diagnosis not present

## 2018-08-12 DIAGNOSIS — B9561 Methicillin susceptible Staphylococcus aureus infection as the cause of diseases classified elsewhere: Secondary | ICD-10-CM | POA: Diagnosis not present

## 2018-08-12 DIAGNOSIS — B965 Pseudomonas (aeruginosa) (mallei) (pseudomallei) as the cause of diseases classified elsewhere: Secondary | ICD-10-CM | POA: Diagnosis not present

## 2018-08-16 ENCOUNTER — Encounter (HOSPITAL_BASED_OUTPATIENT_CLINIC_OR_DEPARTMENT_OTHER): Payer: BLUE CROSS/BLUE SHIELD | Attending: Internal Medicine

## 2018-08-16 DIAGNOSIS — L97212 Non-pressure chronic ulcer of right calf with fat layer exposed: Secondary | ICD-10-CM | POA: Diagnosis not present

## 2018-08-16 DIAGNOSIS — I509 Heart failure, unspecified: Secondary | ICD-10-CM | POA: Diagnosis not present

## 2018-08-16 DIAGNOSIS — I87331 Chronic venous hypertension (idiopathic) with ulcer and inflammation of right lower extremity: Secondary | ICD-10-CM | POA: Diagnosis not present

## 2018-08-16 DIAGNOSIS — E11622 Type 2 diabetes mellitus with other skin ulcer: Secondary | ICD-10-CM | POA: Insufficient documentation

## 2018-08-16 DIAGNOSIS — L97812 Non-pressure chronic ulcer of other part of right lower leg with fat layer exposed: Secondary | ICD-10-CM | POA: Insufficient documentation

## 2018-08-16 DIAGNOSIS — B9689 Other specified bacterial agents as the cause of diseases classified elsewhere: Secondary | ICD-10-CM | POA: Insufficient documentation

## 2018-08-16 DIAGNOSIS — B9561 Methicillin susceptible Staphylococcus aureus infection as the cause of diseases classified elsewhere: Secondary | ICD-10-CM | POA: Insufficient documentation

## 2018-08-16 DIAGNOSIS — G4730 Sleep apnea, unspecified: Secondary | ICD-10-CM | POA: Insufficient documentation

## 2018-08-16 DIAGNOSIS — I110 Hypertensive heart disease with heart failure: Secondary | ICD-10-CM | POA: Insufficient documentation

## 2018-08-16 DIAGNOSIS — L03115 Cellulitis of right lower limb: Secondary | ICD-10-CM | POA: Diagnosis not present

## 2018-08-19 DIAGNOSIS — L97812 Non-pressure chronic ulcer of other part of right lower leg with fat layer exposed: Secondary | ICD-10-CM | POA: Diagnosis not present

## 2018-08-19 DIAGNOSIS — I87311 Chronic venous hypertension (idiopathic) with ulcer of right lower extremity: Secondary | ICD-10-CM | POA: Diagnosis not present

## 2018-08-19 DIAGNOSIS — G4730 Sleep apnea, unspecified: Secondary | ICD-10-CM | POA: Diagnosis not present

## 2018-08-19 DIAGNOSIS — I110 Hypertensive heart disease with heart failure: Secondary | ICD-10-CM | POA: Diagnosis not present

## 2018-08-19 DIAGNOSIS — I87312 Chronic venous hypertension (idiopathic) with ulcer of left lower extremity: Secondary | ICD-10-CM | POA: Diagnosis not present

## 2018-08-19 DIAGNOSIS — L03115 Cellulitis of right lower limb: Secondary | ICD-10-CM | POA: Diagnosis not present

## 2018-08-19 DIAGNOSIS — I87331 Chronic venous hypertension (idiopathic) with ulcer and inflammation of right lower extremity: Secondary | ICD-10-CM | POA: Diagnosis not present

## 2018-08-19 DIAGNOSIS — E11622 Type 2 diabetes mellitus with other skin ulcer: Secondary | ICD-10-CM | POA: Diagnosis not present

## 2018-08-19 DIAGNOSIS — I509 Heart failure, unspecified: Secondary | ICD-10-CM | POA: Diagnosis not present

## 2018-08-19 DIAGNOSIS — B9561 Methicillin susceptible Staphylococcus aureus infection as the cause of diseases classified elsewhere: Secondary | ICD-10-CM | POA: Diagnosis not present

## 2018-08-19 DIAGNOSIS — B9689 Other specified bacterial agents as the cause of diseases classified elsewhere: Secondary | ICD-10-CM | POA: Diagnosis not present

## 2018-08-19 DIAGNOSIS — L97212 Non-pressure chronic ulcer of right calf with fat layer exposed: Secondary | ICD-10-CM | POA: Diagnosis not present

## 2018-08-23 DIAGNOSIS — I110 Hypertensive heart disease with heart failure: Secondary | ICD-10-CM | POA: Diagnosis not present

## 2018-08-23 DIAGNOSIS — B9561 Methicillin susceptible Staphylococcus aureus infection as the cause of diseases classified elsewhere: Secondary | ICD-10-CM | POA: Diagnosis not present

## 2018-08-23 DIAGNOSIS — G4730 Sleep apnea, unspecified: Secondary | ICD-10-CM | POA: Diagnosis not present

## 2018-08-23 DIAGNOSIS — B9689 Other specified bacterial agents as the cause of diseases classified elsewhere: Secondary | ICD-10-CM | POA: Diagnosis not present

## 2018-08-23 DIAGNOSIS — L03115 Cellulitis of right lower limb: Secondary | ICD-10-CM | POA: Diagnosis not present

## 2018-08-23 DIAGNOSIS — I509 Heart failure, unspecified: Secondary | ICD-10-CM | POA: Diagnosis not present

## 2018-08-23 DIAGNOSIS — L97812 Non-pressure chronic ulcer of other part of right lower leg with fat layer exposed: Secondary | ICD-10-CM | POA: Diagnosis not present

## 2018-08-23 DIAGNOSIS — L97212 Non-pressure chronic ulcer of right calf with fat layer exposed: Secondary | ICD-10-CM | POA: Diagnosis not present

## 2018-08-23 DIAGNOSIS — I87331 Chronic venous hypertension (idiopathic) with ulcer and inflammation of right lower extremity: Secondary | ICD-10-CM | POA: Diagnosis not present

## 2018-08-23 DIAGNOSIS — E11622 Type 2 diabetes mellitus with other skin ulcer: Secondary | ICD-10-CM | POA: Diagnosis not present

## 2018-08-24 ENCOUNTER — Ambulatory Visit (HOSPITAL_COMMUNITY): Payer: BLUE CROSS/BLUE SHIELD | Admitting: Nurse Practitioner

## 2018-08-26 DIAGNOSIS — G4730 Sleep apnea, unspecified: Secondary | ICD-10-CM | POA: Diagnosis not present

## 2018-08-26 DIAGNOSIS — I87311 Chronic venous hypertension (idiopathic) with ulcer of right lower extremity: Secondary | ICD-10-CM | POA: Diagnosis not present

## 2018-08-26 DIAGNOSIS — L03115 Cellulitis of right lower limb: Secondary | ICD-10-CM | POA: Diagnosis not present

## 2018-08-26 DIAGNOSIS — E11622 Type 2 diabetes mellitus with other skin ulcer: Secondary | ICD-10-CM | POA: Diagnosis not present

## 2018-08-26 DIAGNOSIS — L97812 Non-pressure chronic ulcer of other part of right lower leg with fat layer exposed: Secondary | ICD-10-CM | POA: Diagnosis not present

## 2018-08-26 DIAGNOSIS — B9689 Other specified bacterial agents as the cause of diseases classified elsewhere: Secondary | ICD-10-CM | POA: Diagnosis not present

## 2018-08-26 DIAGNOSIS — S81801A Unspecified open wound, right lower leg, initial encounter: Secondary | ICD-10-CM | POA: Diagnosis not present

## 2018-08-26 DIAGNOSIS — L97212 Non-pressure chronic ulcer of right calf with fat layer exposed: Secondary | ICD-10-CM | POA: Diagnosis not present

## 2018-08-26 DIAGNOSIS — I87331 Chronic venous hypertension (idiopathic) with ulcer and inflammation of right lower extremity: Secondary | ICD-10-CM | POA: Diagnosis not present

## 2018-08-26 DIAGNOSIS — I509 Heart failure, unspecified: Secondary | ICD-10-CM | POA: Diagnosis not present

## 2018-08-26 DIAGNOSIS — I110 Hypertensive heart disease with heart failure: Secondary | ICD-10-CM | POA: Diagnosis not present

## 2018-08-26 DIAGNOSIS — B9561 Methicillin susceptible Staphylococcus aureus infection as the cause of diseases classified elsewhere: Secondary | ICD-10-CM | POA: Diagnosis not present

## 2018-08-30 DIAGNOSIS — I87331 Chronic venous hypertension (idiopathic) with ulcer and inflammation of right lower extremity: Secondary | ICD-10-CM | POA: Diagnosis not present

## 2018-08-30 DIAGNOSIS — E11622 Type 2 diabetes mellitus with other skin ulcer: Secondary | ICD-10-CM | POA: Diagnosis not present

## 2018-08-30 DIAGNOSIS — L97812 Non-pressure chronic ulcer of other part of right lower leg with fat layer exposed: Secondary | ICD-10-CM | POA: Diagnosis not present

## 2018-08-30 DIAGNOSIS — B9561 Methicillin susceptible Staphylococcus aureus infection as the cause of diseases classified elsewhere: Secondary | ICD-10-CM | POA: Diagnosis not present

## 2018-08-30 DIAGNOSIS — L97212 Non-pressure chronic ulcer of right calf with fat layer exposed: Secondary | ICD-10-CM | POA: Diagnosis not present

## 2018-08-30 DIAGNOSIS — B9689 Other specified bacterial agents as the cause of diseases classified elsewhere: Secondary | ICD-10-CM | POA: Diagnosis not present

## 2018-08-30 DIAGNOSIS — G4730 Sleep apnea, unspecified: Secondary | ICD-10-CM | POA: Diagnosis not present

## 2018-08-30 DIAGNOSIS — I509 Heart failure, unspecified: Secondary | ICD-10-CM | POA: Diagnosis not present

## 2018-08-30 DIAGNOSIS — I110 Hypertensive heart disease with heart failure: Secondary | ICD-10-CM | POA: Diagnosis not present

## 2018-08-30 DIAGNOSIS — L03115 Cellulitis of right lower limb: Secondary | ICD-10-CM | POA: Diagnosis not present

## 2018-08-31 ENCOUNTER — Other Ambulatory Visit (HOSPITAL_COMMUNITY): Payer: Self-pay | Admitting: Internal Medicine

## 2018-08-31 DIAGNOSIS — L97911 Non-pressure chronic ulcer of unspecified part of right lower leg limited to breakdown of skin: Secondary | ICD-10-CM

## 2018-08-31 DIAGNOSIS — L97921 Non-pressure chronic ulcer of unspecified part of left lower leg limited to breakdown of skin: Principal | ICD-10-CM

## 2018-09-01 ENCOUNTER — Inpatient Hospital Stay (HOSPITAL_COMMUNITY): Admission: RE | Admit: 2018-09-01 | Payer: BLUE CROSS/BLUE SHIELD | Source: Ambulatory Visit

## 2018-09-02 ENCOUNTER — Encounter (HOSPITAL_COMMUNITY): Payer: BLUE CROSS/BLUE SHIELD

## 2018-09-02 ENCOUNTER — Other Ambulatory Visit (HOSPITAL_COMMUNITY)
Admission: RE | Admit: 2018-09-02 | Discharge: 2018-09-02 | Disposition: A | Discharge status: Home / Self Care | Payer: BLUE CROSS/BLUE SHIELD | Source: Other Acute Inpatient Hospital | Attending: Internal Medicine | Admitting: Internal Medicine

## 2018-09-02 DIAGNOSIS — E11622 Type 2 diabetes mellitus with other skin ulcer: Secondary | ICD-10-CM | POA: Diagnosis not present

## 2018-09-02 DIAGNOSIS — L97212 Non-pressure chronic ulcer of right calf with fat layer exposed: Secondary | ICD-10-CM | POA: Insufficient documentation

## 2018-09-02 DIAGNOSIS — B9689 Other specified bacterial agents as the cause of diseases classified elsewhere: Secondary | ICD-10-CM | POA: Diagnosis not present

## 2018-09-02 DIAGNOSIS — L97812 Non-pressure chronic ulcer of other part of right lower leg with fat layer exposed: Secondary | ICD-10-CM | POA: Diagnosis not present

## 2018-09-02 DIAGNOSIS — G4730 Sleep apnea, unspecified: Secondary | ICD-10-CM | POA: Diagnosis not present

## 2018-09-02 DIAGNOSIS — I87331 Chronic venous hypertension (idiopathic) with ulcer and inflammation of right lower extremity: Secondary | ICD-10-CM | POA: Diagnosis not present

## 2018-09-02 DIAGNOSIS — B9561 Methicillin susceptible Staphylococcus aureus infection as the cause of diseases classified elsewhere: Secondary | ICD-10-CM | POA: Diagnosis not present

## 2018-09-02 DIAGNOSIS — I110 Hypertensive heart disease with heart failure: Secondary | ICD-10-CM | POA: Diagnosis not present

## 2018-09-02 DIAGNOSIS — L03115 Cellulitis of right lower limb: Secondary | ICD-10-CM | POA: Diagnosis not present

## 2018-09-02 DIAGNOSIS — I509 Heart failure, unspecified: Secondary | ICD-10-CM | POA: Diagnosis not present

## 2018-09-05 LAB — AEROBIC CULTURE W GRAM STAIN (SUPERFICIAL SPECIMEN)

## 2018-09-06 DIAGNOSIS — L03115 Cellulitis of right lower limb: Secondary | ICD-10-CM | POA: Diagnosis not present

## 2018-09-06 DIAGNOSIS — I509 Heart failure, unspecified: Secondary | ICD-10-CM | POA: Diagnosis not present

## 2018-09-06 DIAGNOSIS — L97212 Non-pressure chronic ulcer of right calf with fat layer exposed: Secondary | ICD-10-CM | POA: Diagnosis not present

## 2018-09-06 DIAGNOSIS — L97812 Non-pressure chronic ulcer of other part of right lower leg with fat layer exposed: Secondary | ICD-10-CM | POA: Diagnosis not present

## 2018-09-06 DIAGNOSIS — B9561 Methicillin susceptible Staphylococcus aureus infection as the cause of diseases classified elsewhere: Secondary | ICD-10-CM | POA: Diagnosis not present

## 2018-09-06 DIAGNOSIS — I110 Hypertensive heart disease with heart failure: Secondary | ICD-10-CM | POA: Diagnosis not present

## 2018-09-06 DIAGNOSIS — I87331 Chronic venous hypertension (idiopathic) with ulcer and inflammation of right lower extremity: Secondary | ICD-10-CM | POA: Diagnosis not present

## 2018-09-06 DIAGNOSIS — E11622 Type 2 diabetes mellitus with other skin ulcer: Secondary | ICD-10-CM | POA: Diagnosis not present

## 2018-09-06 DIAGNOSIS — G4730 Sleep apnea, unspecified: Secondary | ICD-10-CM | POA: Diagnosis not present

## 2018-09-06 DIAGNOSIS — B9689 Other specified bacterial agents as the cause of diseases classified elsewhere: Secondary | ICD-10-CM | POA: Diagnosis not present

## 2018-09-09 DIAGNOSIS — G4730 Sleep apnea, unspecified: Secondary | ICD-10-CM | POA: Diagnosis not present

## 2018-09-09 DIAGNOSIS — I87311 Chronic venous hypertension (idiopathic) with ulcer of right lower extremity: Secondary | ICD-10-CM | POA: Diagnosis not present

## 2018-09-09 DIAGNOSIS — I110 Hypertensive heart disease with heart failure: Secondary | ICD-10-CM | POA: Diagnosis not present

## 2018-09-09 DIAGNOSIS — L97212 Non-pressure chronic ulcer of right calf with fat layer exposed: Secondary | ICD-10-CM | POA: Diagnosis not present

## 2018-09-09 DIAGNOSIS — I509 Heart failure, unspecified: Secondary | ICD-10-CM | POA: Diagnosis not present

## 2018-09-09 DIAGNOSIS — B9689 Other specified bacterial agents as the cause of diseases classified elsewhere: Secondary | ICD-10-CM | POA: Diagnosis not present

## 2018-09-09 DIAGNOSIS — B9561 Methicillin susceptible Staphylococcus aureus infection as the cause of diseases classified elsewhere: Secondary | ICD-10-CM | POA: Diagnosis not present

## 2018-09-09 DIAGNOSIS — E11622 Type 2 diabetes mellitus with other skin ulcer: Secondary | ICD-10-CM | POA: Diagnosis not present

## 2018-09-09 DIAGNOSIS — L03115 Cellulitis of right lower limb: Secondary | ICD-10-CM | POA: Diagnosis not present

## 2018-09-09 DIAGNOSIS — I87331 Chronic venous hypertension (idiopathic) with ulcer and inflammation of right lower extremity: Secondary | ICD-10-CM | POA: Diagnosis not present

## 2018-09-09 DIAGNOSIS — L97812 Non-pressure chronic ulcer of other part of right lower leg with fat layer exposed: Secondary | ICD-10-CM | POA: Diagnosis not present

## 2018-09-13 ENCOUNTER — Ambulatory Visit (HOSPITAL_COMMUNITY)
Admission: RE | Admit: 2018-09-13 | Discharge: 2018-09-13 | Disposition: A | Discharge status: Home / Self Care | Payer: BLUE CROSS/BLUE SHIELD | Source: Ambulatory Visit | Attending: Internal Medicine | Admitting: Internal Medicine

## 2018-09-13 ENCOUNTER — Other Ambulatory Visit: Payer: Self-pay

## 2018-09-13 DIAGNOSIS — L97202 Non-pressure chronic ulcer of unspecified calf with fat layer exposed: Principal | ICD-10-CM

## 2018-09-13 DIAGNOSIS — E11622 Type 2 diabetes mellitus with other skin ulcer: Secondary | ICD-10-CM

## 2018-09-13 DIAGNOSIS — L97921 Non-pressure chronic ulcer of unspecified part of left lower leg limited to breakdown of skin: Secondary | ICD-10-CM | POA: Diagnosis not present

## 2018-09-13 DIAGNOSIS — L97911 Non-pressure chronic ulcer of unspecified part of right lower leg limited to breakdown of skin: Secondary | ICD-10-CM | POA: Diagnosis not present

## 2018-09-14 ENCOUNTER — Ambulatory Visit (HOSPITAL_COMMUNITY)
Admission: RE | Admit: 2018-09-14 | Discharge: 2018-09-14 | Disposition: A | Discharge status: Home / Self Care | Payer: BLUE CROSS/BLUE SHIELD | Source: Ambulatory Visit | Attending: Family | Admitting: Family

## 2018-09-14 ENCOUNTER — Other Ambulatory Visit (HOSPITAL_COMMUNITY): Payer: Self-pay | Admitting: Internal Medicine

## 2018-09-14 DIAGNOSIS — L97212 Non-pressure chronic ulcer of right calf with fat layer exposed: Secondary | ICD-10-CM

## 2018-09-14 DIAGNOSIS — I110 Hypertensive heart disease with heart failure: Secondary | ICD-10-CM | POA: Diagnosis not present

## 2018-09-14 DIAGNOSIS — L03115 Cellulitis of right lower limb: Secondary | ICD-10-CM | POA: Diagnosis not present

## 2018-09-14 DIAGNOSIS — I509 Heart failure, unspecified: Secondary | ICD-10-CM | POA: Diagnosis not present

## 2018-09-14 DIAGNOSIS — B9689 Other specified bacterial agents as the cause of diseases classified elsewhere: Secondary | ICD-10-CM | POA: Diagnosis not present

## 2018-09-14 DIAGNOSIS — G4730 Sleep apnea, unspecified: Secondary | ICD-10-CM | POA: Diagnosis not present

## 2018-09-14 DIAGNOSIS — B9561 Methicillin susceptible Staphylococcus aureus infection as the cause of diseases classified elsewhere: Secondary | ICD-10-CM | POA: Diagnosis not present

## 2018-09-14 DIAGNOSIS — L97812 Non-pressure chronic ulcer of other part of right lower leg with fat layer exposed: Secondary | ICD-10-CM | POA: Diagnosis not present

## 2018-09-14 DIAGNOSIS — I87331 Chronic venous hypertension (idiopathic) with ulcer and inflammation of right lower extremity: Secondary | ICD-10-CM | POA: Diagnosis not present

## 2018-09-14 DIAGNOSIS — E11622 Type 2 diabetes mellitus with other skin ulcer: Secondary | ICD-10-CM | POA: Diagnosis not present

## 2018-09-17 ENCOUNTER — Encounter (HOSPITAL_BASED_OUTPATIENT_CLINIC_OR_DEPARTMENT_OTHER): Payer: BLUE CROSS/BLUE SHIELD | Attending: Internal Medicine

## 2018-09-17 ENCOUNTER — Other Ambulatory Visit: Payer: Self-pay

## 2018-09-17 ENCOUNTER — Other Ambulatory Visit (HOSPITAL_COMMUNITY)
Admission: RE | Admit: 2018-09-17 | Discharge: 2018-09-17 | Disposition: A | Discharge status: Home / Self Care | Payer: BLUE CROSS/BLUE SHIELD | Source: Other Acute Inpatient Hospital | Attending: Internal Medicine | Admitting: Internal Medicine

## 2018-09-17 DIAGNOSIS — I110 Hypertensive heart disease with heart failure: Secondary | ICD-10-CM | POA: Diagnosis not present

## 2018-09-17 DIAGNOSIS — I509 Heart failure, unspecified: Secondary | ICD-10-CM | POA: Insufficient documentation

## 2018-09-17 DIAGNOSIS — L97215 Non-pressure chronic ulcer of right calf with muscle involvement without evidence of necrosis: Secondary | ICD-10-CM | POA: Insufficient documentation

## 2018-09-17 DIAGNOSIS — L97812 Non-pressure chronic ulcer of other part of right lower leg with fat layer exposed: Secondary | ICD-10-CM | POA: Insufficient documentation

## 2018-09-17 DIAGNOSIS — L97212 Non-pressure chronic ulcer of right calf with fat layer exposed: Secondary | ICD-10-CM | POA: Insufficient documentation

## 2018-09-17 DIAGNOSIS — B9562 Methicillin resistant Staphylococcus aureus infection as the cause of diseases classified elsewhere: Secondary | ICD-10-CM | POA: Diagnosis not present

## 2018-09-17 DIAGNOSIS — G4730 Sleep apnea, unspecified: Secondary | ICD-10-CM | POA: Diagnosis not present

## 2018-09-17 DIAGNOSIS — E11622 Type 2 diabetes mellitus with other skin ulcer: Secondary | ICD-10-CM | POA: Diagnosis not present

## 2018-09-17 DIAGNOSIS — I87311 Chronic venous hypertension (idiopathic) with ulcer of right lower extremity: Secondary | ICD-10-CM | POA: Diagnosis not present

## 2018-09-20 DIAGNOSIS — I509 Heart failure, unspecified: Secondary | ICD-10-CM | POA: Diagnosis not present

## 2018-09-20 DIAGNOSIS — L97812 Non-pressure chronic ulcer of other part of right lower leg with fat layer exposed: Secondary | ICD-10-CM | POA: Diagnosis not present

## 2018-09-20 DIAGNOSIS — L97212 Non-pressure chronic ulcer of right calf with fat layer exposed: Secondary | ICD-10-CM | POA: Diagnosis not present

## 2018-09-20 DIAGNOSIS — B9562 Methicillin resistant Staphylococcus aureus infection as the cause of diseases classified elsewhere: Secondary | ICD-10-CM | POA: Diagnosis not present

## 2018-09-20 DIAGNOSIS — E11622 Type 2 diabetes mellitus with other skin ulcer: Secondary | ICD-10-CM | POA: Diagnosis not present

## 2018-09-20 DIAGNOSIS — G4730 Sleep apnea, unspecified: Secondary | ICD-10-CM | POA: Diagnosis not present

## 2018-09-20 DIAGNOSIS — I110 Hypertensive heart disease with heart failure: Secondary | ICD-10-CM | POA: Diagnosis not present

## 2018-09-20 DIAGNOSIS — L97215 Non-pressure chronic ulcer of right calf with muscle involvement without evidence of necrosis: Secondary | ICD-10-CM | POA: Diagnosis not present

## 2018-09-21 ENCOUNTER — Telehealth (HOSPITAL_COMMUNITY): Payer: Self-pay | Admitting: Rehabilitation

## 2018-09-21 LAB — AEROBIC CULTURE? (SUPERFICIAL SPECIMEN)

## 2018-09-21 LAB — AEROBIC CULTURE W GRAM STAIN (SUPERFICIAL SPECIMEN)

## 2018-09-21 NOTE — Telephone Encounter (Signed)
The above patient or their representative was contacted and gave the following answers to these questions:         Do you have any of the following symptoms? No  Fever                    Cough                   Shortness of breath  Do  you have any of the following other symptoms? No   muscle pain         vomiting,        diarrhea        rash         weakness        red eye        abdominal pain         bruising          bruising or bleeding              joint pain           severe headache    Have you been in contact with someone who was or has been sick in the past 2 weeks? No  Yes                 Unsure                         Unable to assess   Does the person that you were in contact with have any of the following symptoms? N/A  Cough         shortness of breath           muscle pain         vomiting,            diarrhea            rash            weakness           fever            red eye           abdominal pain           bruising  or  bleeding                joint pain                severe headache               Have you  or someone you have been in contact with traveled internationally in th last month?   No      If yes, which countries?   Have you  or someone you have been in contact with traveled outside [place] in th last month?   No      If yes, which state and city?   COMMENTS OR ACTION PLAN FOR THIS PATIENT:          

## 2018-09-22 ENCOUNTER — Ambulatory Visit (INDEPENDENT_AMBULATORY_CARE_PROVIDER_SITE_OTHER): Payer: BLUE CROSS/BLUE SHIELD | Admitting: Vascular Surgery

## 2018-09-22 ENCOUNTER — Encounter: Payer: Self-pay | Admitting: Vascular Surgery

## 2018-09-22 ENCOUNTER — Other Ambulatory Visit: Payer: Self-pay

## 2018-09-22 VITALS — BP 156/112 | HR 77 | Temp 98.4°F | Resp 20 | Ht 73.5 in | Wt 284.0 lb

## 2018-09-22 DIAGNOSIS — I872 Venous insufficiency (chronic) (peripheral): Secondary | ICD-10-CM

## 2018-09-22 DIAGNOSIS — I83019 Varicose veins of right lower extremity with ulcer of unspecified site: Secondary | ICD-10-CM | POA: Diagnosis not present

## 2018-09-22 DIAGNOSIS — L97919 Non-pressure chronic ulcer of unspecified part of right lower leg with unspecified severity: Secondary | ICD-10-CM | POA: Diagnosis not present

## 2018-09-22 NOTE — Progress Notes (Signed)
REASON FOR CONSULT:    Vascular evaluation because of nonhealing wound.  The consult is requested by Dr. Dellia Nims.  ASSESSMENT & PLAN:   VENOUS ULCER RIGHT LEG: The patient has a very extensive wound on the posterior right leg.  He does have some deep venous reflux on the right but only minimal superficial venous reflux in the right great saphenous vein.  For this reason I do not think he is a candidate at this point for laser ablation of the right great saphenous vein.  The small saphenous vein is thrombosed.  I have discussed with him the importance of intermittent leg elevation and the proper positioning for this.  I have explained that if he does not elevate his legs correctly that this is not effective.  We have also encouraged him to avoid prolonged sitting and standing.  He will continue with compression therapy.  Also discussed the importance of weight management and nutrition.  I reassured him that he has excellent arterial flow and no evidence of arterial insufficiency which would compromise healing.  I will be happy to see him back at any time if his wounds stop making progress.   Deitra Mayo, MD, FACS Beeper [phone] Office: [phone]   HPI:   [name] is a pleasant 63 y.o. male, who developed a wound on the posterior right calf in October or November 2019.  This wound gradually progressed and the patient was subsequently referred to the wound care center where he has been undergoing aggressive wound care for the last 2 months.  Currently he is receiving a 4 layer compression dressing.  He states that the wound has improved.  He is unaware of any previous history of DVT or phlebitis.  He denies any history of claudication, rest pain, or nonhealing ulcers.  When he was working he was on his feet a lot and for the last 10 years of his career (he is now retired) he was sitting most of the time.  He has had some mild chronic lower extremity swelling.  Patient has undergone  an ablation procedure for atrial fibrillation and is on Xarelto.  I have reviewed the records that were sent from the referring office.  The patient was seen on 09/17/2018.  Patient appears to have a history of diabetes and is on glipizide.  He developed a wound over the back of his right calf.  The wound gradually progressed.  Of note he is on Xarelto for atrial fibrillation.  He status post ablation and recent cardioversion.  He apparently had an ABI study done and the vessels were not compressible.  He had a venous reflux study done which showed reflux in the femoral vein and popliteal vein in addition to the great saphenous vein and saphenofemoral junction.  The small saphenous vein in the right has superficial thrombophlebitis of unknown age. He was sent here for evaluation for laser ablation of the right great saphenous vein.   Past Medical History:  Diagnosis Date  . Allergy    SEASONAL  . Asthma   . Atrial fibrillation ([place])    persistant 02/2009  . Atrial flutter ([place])    s/p CTI ablation 08/06/10  . Cardiac arrhythmia due to congenital heart disease   . Cataract    BEGINING  . Chronic rhinitis   . Chronic systolic dysfunction of left ventricle   . Colon polyp   . Congestive heart failure ([place])   . Diverticulosis    colonoscopy 04/03/2009  . Hyperlipidemia   .  Hypertension   . Hypertension   . Morbid obesity ([place])    target weight = 219  for BMI < 30  . Nonischemic cardiomyopathy (H[facility])    tachycardia mediated  . Sleep apnea    original 2001    Family History  Problem Relation Age of Onset  . Hypertension Mother   . Kidney failure Mother        Due to sepsis  . Colon cancer Father 13  . COPD Father   . Colon cancer Brother   . Colon polyps Neg Hx   . Diabetes Neg Hx   . Kidney disease Neg Hx   . Esophageal cancer Neg Hx     SOCIAL HISTORY: Social History   Socioeconomic History  . Marital status: Single    Spouse name: Not on file  . Number of children: 0  .  Years of education: Not on file  . Highest education level: Not on file  Occupational History  . Occupation: Press photographer  Social Needs  . Financial resource strain: Not on file  . Food insecurity:    Worry: Not on file    Inability: Not on file  . Transportation needs:    Medical: Not on file    Non-medical: Not on file  Tobacco Use  . Smoking status: Never Smoker  . Smokeless tobacco: Never Used  Substance and Sexual Activity  . Alcohol use: Yes    Alcohol/week: 0.0 standard drinks    Comment: previously quite heavy, working on cessation   . Drug use: No  . Sexual activity: Not on file  Lifestyle  . Physical activity:    Days per week: Not on file    Minutes per session: Not on file  . Stress: Not on file  Relationships  . Social connections:    Talks on phone: Not on file    Gets together: Not on file    Attends religious service: Not on file    Active member of club or organization: Not on file    Attends meetings of clubs or organizations: Not on file    Relationship status: Not on file  . Intimate partner violence:    Fear of current or ex partner: Not on file    Emotionally abused: Not on file    Physically abused: Not on file    Forced sexual activity: Not on file  Other Topics Concern  . Not on file  Social History Narrative   Lives in [place] and works for [name]    No Known Allergies  Current Outpatient Medications  Medication Sig Dispense Refill  . albuterol (PROVENTIL HFA;VENTOLIN HFA) 108 (90 Base) MCG/ACT inhaler Inhale 2 puffs into the lungs every 6 (six) hours as needed for wheezing or shortness of breath.    [name] amiodarone (PACERONE) 200 MG tablet Take 1 tablet (200 mg total) by mouth daily. 180 tablet 3  . cefdinir (OMNICEF) 300 MG capsule     . furosemide (LASIX) 80 MG tablet Take 0.5-1 tablets (40-80 mg total) by mouth daily as needed for fluid or edema. 90 tablet 3  . gabapentin (NEURONTIN) 300 MG capsule TAKE 1 CAPSULE BY MOUTH THREE TIMES A  DAY 270 capsule 2  . glipiZIDE (GLUCOTROL) 5 MG tablet TAKE 1 TABLET TWICE A DAY BEFORE MEALS 180 tablet 3  . KLOR-CON M20 20 MEQ tablet TAKE 1 TABLET BY MOUTH TWICE A DAY 180 tablet 3  . mometasone (NASONEX) 50 MCG/ACT nasal spray Place 2 sprays into  the nose daily as needed (allergies or rhinitis).     . rosuvastatin (CRESTOR) 10 MG tablet Take 1 tablet (10 mg total) by mouth daily. 90 tablet 3  . sildenafil (VIAGRA) 100 MG tablet Take 1 tablet (100 mg total) by mouth as directed. 15 tablet 3  . SYMBICORT 80-4.5 MCG/ACT inhaler USE 2 INHALATIONS TWICE A DAY 30.6 g 1  . traMADol (ULTRAM) 50 MG tablet     . XARELTO 20 MG TABS tablet TAKE 1 TABLET (20 MG TOTAL) BY MOUTH DAILY WITH SUPPER. 90 tablet 1  . carvedilol (COREG) 25 MG tablet Take 0.5 tablets (12.5 mg total) by mouth 2 (two) times daily with a meal. (Patient not taking: Reported on 09/22/2018) 180 tablet 3  . losartan (COZAAR) 50 MG tablet Take 1 tablet (50 mg total) by mouth daily. 90 tablet 3  . methocarbamol (ROBAXIN) 500 MG tablet Take 1 tablet (500 mg total) by mouth 4 (four) times daily. (Patient not taking: Reported on 09/22/2018) 30 tablet 1   No current facility-administered medications for this visit.     REVIEW OF SYSTEMS:  [X]  denotes positive finding, [ ]  denotes negative finding Cardiac  Comments:  Chest pain or chest pressure:    Shortness of breath upon exertion:    Short of breath when lying flat:    Irregular heart rhythm:        Vascular    Pain in calf, thigh, or hip brought on by ambulation:    Pain in feet at night that wakes you up from your sleep:     Blood clot in your veins: x   Leg swelling:         Pulmonary    Oxygen at home:    Productive cough:     Wheezing:         Neurologic    Sudden weakness in arms or legs:     Sudden numbness in arms or legs:     Sudden onset of difficulty speaking or slurred speech:    Temporary loss of vision in one eye:     Problems with dizziness:          Gastrointestinal    Blood in stool:     Vomited blood:         Genitourinary    Burning when urinating:     Blood in urine:        Psychiatric    Major depression:         Hematologic    Bleeding problems:    Problems with blood clotting too easily:        Skin    Rashes or ulcers:        Constitutional    Fever or chills:     PHYSICAL EXAM:   Vitals:   09/22/18 1258  BP: (!) 156/112  Pulse: 77  Resp: 20  Temp: 98.4 F (36.9 C)  SpO2: 95%  Weight: 284 lb (128.8 kg)  Height: 6' 1.5" (1.867 m)   GENERAL: The patient is a well-nourished male, in no acute distress. The vital signs are documented above. CARDIAC: There is a regular rate and rhythm.  VASCULAR: I do not detect carotid bruits. He has palpable femoral and dorsalis pedis pulses bilaterally. He has a dressing from the knee encompassing the foot on the right leg so I cannot examine his pulses are examined the leg.  He would prefer that the check dressing not be removed at it as it is  to be changed tomorrow at the wound care center. However he did show me 2 pictures of his wound which I have attached    He does have hyperpigmentation of the left leg. PULMONARY: There is good air exchange bilaterally without wheezing or rales. ABDOMEN: Soft and non-tender with normal pitched bowel sounds.  MUSCULOSKELETAL: There are no major deformities or cyanosis. NEUROLOGIC: No focal weakness or paresthesias are detected. SKIN: There are no ulcers or rashes noted. PSYCHIATRIC: The patient has a normal affect.  DATA:    VENOUS DUPLEX: I reviewed the venous duplex scan that was done on 09/13/2018.  This shows some superficial thrombophlebitis of the right small saphenous vein.  The age is indeterminate.  There is no evidence of DVT.  The patient does have deep venous reflux involving the femoral vein and popliteal vein.  There is also reflux in the great saphenous vein in the proximal thigh.  ARTERIAL DOPPLER STUDY: I also  reviewed the arterial Doppler study that was done on 09/13/2018.   Of note pedal pressures were not obtained on the right because the patient had thrombus in the small saphenous vein.  The pedal waveforms were however triphasic and his toe pressure on the right was 149 mmHg.  On the left side the arteries were not compressible.  There were triphasic Doppler signals with the toe pressures 151 mmHg.

## 2018-09-23 DIAGNOSIS — I10 Essential (primary) hypertension: Secondary | ICD-10-CM | POA: Diagnosis not present

## 2018-09-23 DIAGNOSIS — I509 Heart failure, unspecified: Secondary | ICD-10-CM | POA: Diagnosis not present

## 2018-09-23 DIAGNOSIS — B9562 Methicillin resistant Staphylococcus aureus infection as the cause of diseases classified elsewhere: Secondary | ICD-10-CM | POA: Diagnosis not present

## 2018-09-23 DIAGNOSIS — L97212 Non-pressure chronic ulcer of right calf with fat layer exposed: Secondary | ICD-10-CM | POA: Diagnosis not present

## 2018-09-23 DIAGNOSIS — G4730 Sleep apnea, unspecified: Secondary | ICD-10-CM | POA: Diagnosis not present

## 2018-09-23 DIAGNOSIS — L97812 Non-pressure chronic ulcer of other part of right lower leg with fat layer exposed: Secondary | ICD-10-CM | POA: Diagnosis not present

## 2018-09-23 DIAGNOSIS — E11622 Type 2 diabetes mellitus with other skin ulcer: Secondary | ICD-10-CM | POA: Diagnosis not present

## 2018-09-23 DIAGNOSIS — L97215 Non-pressure chronic ulcer of right calf with muscle involvement without evidence of necrosis: Secondary | ICD-10-CM | POA: Diagnosis not present

## 2018-09-23 DIAGNOSIS — I110 Hypertensive heart disease with heart failure: Secondary | ICD-10-CM | POA: Diagnosis not present

## 2018-09-23 DIAGNOSIS — I87331 Chronic venous hypertension (idiopathic) with ulcer and inflammation of right lower extremity: Secondary | ICD-10-CM | POA: Diagnosis not present

## 2018-09-27 DIAGNOSIS — L97212 Non-pressure chronic ulcer of right calf with fat layer exposed: Secondary | ICD-10-CM | POA: Diagnosis not present

## 2018-09-27 DIAGNOSIS — L97215 Non-pressure chronic ulcer of right calf with muscle involvement without evidence of necrosis: Secondary | ICD-10-CM | POA: Diagnosis not present

## 2018-09-27 DIAGNOSIS — L97812 Non-pressure chronic ulcer of other part of right lower leg with fat layer exposed: Secondary | ICD-10-CM | POA: Diagnosis not present

## 2018-09-27 DIAGNOSIS — G4730 Sleep apnea, unspecified: Secondary | ICD-10-CM | POA: Diagnosis not present

## 2018-09-27 DIAGNOSIS — I110 Hypertensive heart disease with heart failure: Secondary | ICD-10-CM | POA: Diagnosis not present

## 2018-09-27 DIAGNOSIS — E11622 Type 2 diabetes mellitus with other skin ulcer: Secondary | ICD-10-CM | POA: Diagnosis not present

## 2018-09-27 DIAGNOSIS — B9562 Methicillin resistant Staphylococcus aureus infection as the cause of diseases classified elsewhere: Secondary | ICD-10-CM | POA: Diagnosis not present

## 2018-09-27 DIAGNOSIS — I509 Heart failure, unspecified: Secondary | ICD-10-CM | POA: Diagnosis not present

## 2018-09-30 DIAGNOSIS — L97812 Non-pressure chronic ulcer of other part of right lower leg with fat layer exposed: Secondary | ICD-10-CM | POA: Diagnosis not present

## 2018-09-30 DIAGNOSIS — L97215 Non-pressure chronic ulcer of right calf with muscle involvement without evidence of necrosis: Secondary | ICD-10-CM | POA: Diagnosis not present

## 2018-09-30 DIAGNOSIS — B9562 Methicillin resistant Staphylococcus aureus infection as the cause of diseases classified elsewhere: Secondary | ICD-10-CM | POA: Diagnosis not present

## 2018-09-30 DIAGNOSIS — I110 Hypertensive heart disease with heart failure: Secondary | ICD-10-CM | POA: Diagnosis not present

## 2018-09-30 DIAGNOSIS — L97219 Non-pressure chronic ulcer of right calf with unspecified severity: Secondary | ICD-10-CM | POA: Diagnosis not present

## 2018-09-30 DIAGNOSIS — I87311 Chronic venous hypertension (idiopathic) with ulcer of right lower extremity: Secondary | ICD-10-CM | POA: Diagnosis not present

## 2018-09-30 DIAGNOSIS — I509 Heart failure, unspecified: Secondary | ICD-10-CM | POA: Diagnosis not present

## 2018-09-30 DIAGNOSIS — S81801A Unspecified open wound, right lower leg, initial encounter: Secondary | ICD-10-CM | POA: Diagnosis not present

## 2018-09-30 DIAGNOSIS — L97212 Non-pressure chronic ulcer of right calf with fat layer exposed: Secondary | ICD-10-CM | POA: Diagnosis not present

## 2018-09-30 DIAGNOSIS — E11622 Type 2 diabetes mellitus with other skin ulcer: Secondary | ICD-10-CM | POA: Diagnosis not present

## 2018-09-30 DIAGNOSIS — G4730 Sleep apnea, unspecified: Secondary | ICD-10-CM | POA: Diagnosis not present

## 2018-10-02 ENCOUNTER — Other Ambulatory Visit: Payer: Self-pay | Admitting: Internal Medicine

## 2018-10-04 DIAGNOSIS — G4730 Sleep apnea, unspecified: Secondary | ICD-10-CM | POA: Diagnosis not present

## 2018-10-04 DIAGNOSIS — L97215 Non-pressure chronic ulcer of right calf with muscle involvement without evidence of necrosis: Secondary | ICD-10-CM | POA: Diagnosis not present

## 2018-10-04 DIAGNOSIS — I110 Hypertensive heart disease with heart failure: Secondary | ICD-10-CM | POA: Diagnosis not present

## 2018-10-04 DIAGNOSIS — L97212 Non-pressure chronic ulcer of right calf with fat layer exposed: Secondary | ICD-10-CM | POA: Diagnosis not present

## 2018-10-04 DIAGNOSIS — B9562 Methicillin resistant Staphylococcus aureus infection as the cause of diseases classified elsewhere: Secondary | ICD-10-CM | POA: Diagnosis not present

## 2018-10-04 DIAGNOSIS — E11622 Type 2 diabetes mellitus with other skin ulcer: Secondary | ICD-10-CM | POA: Diagnosis not present

## 2018-10-04 DIAGNOSIS — L97812 Non-pressure chronic ulcer of other part of right lower leg with fat layer exposed: Secondary | ICD-10-CM | POA: Diagnosis not present

## 2018-10-04 DIAGNOSIS — I509 Heart failure, unspecified: Secondary | ICD-10-CM | POA: Diagnosis not present

## 2018-10-04 NOTE — Telephone Encounter (Signed)
Last OV 07/27/2018 Scr 1.76 on 07/01/2018 ABW crcl 70ml/min Xarelto 20mg  daily sent to pharmacy

## 2018-10-07 ENCOUNTER — Telehealth: Payer: Self-pay | Admitting: Internal Medicine

## 2018-10-07 DIAGNOSIS — L97215 Non-pressure chronic ulcer of right calf with muscle involvement without evidence of necrosis: Secondary | ICD-10-CM | POA: Diagnosis not present

## 2018-10-07 DIAGNOSIS — B9562 Methicillin resistant Staphylococcus aureus infection as the cause of diseases classified elsewhere: Secondary | ICD-10-CM | POA: Diagnosis not present

## 2018-10-07 DIAGNOSIS — I509 Heart failure, unspecified: Secondary | ICD-10-CM | POA: Diagnosis not present

## 2018-10-07 DIAGNOSIS — L97212 Non-pressure chronic ulcer of right calf with fat layer exposed: Secondary | ICD-10-CM | POA: Diagnosis not present

## 2018-10-07 DIAGNOSIS — L97812 Non-pressure chronic ulcer of other part of right lower leg with fat layer exposed: Secondary | ICD-10-CM | POA: Diagnosis not present

## 2018-10-07 DIAGNOSIS — I110 Hypertensive heart disease with heart failure: Secondary | ICD-10-CM | POA: Diagnosis not present

## 2018-10-07 DIAGNOSIS — I87311 Chronic venous hypertension (idiopathic) with ulcer of right lower extremity: Secondary | ICD-10-CM | POA: Diagnosis not present

## 2018-10-07 DIAGNOSIS — G4730 Sleep apnea, unspecified: Secondary | ICD-10-CM | POA: Diagnosis not present

## 2018-10-07 DIAGNOSIS — E11622 Type 2 diabetes mellitus with other skin ulcer: Secondary | ICD-10-CM | POA: Diagnosis not present

## 2018-10-07 NOTE — Telephone Encounter (Signed)
I called pt to schedule virtual visit with patient to discuss Afib Ablation. Patient stated he had a foot wound and having two appts per week for that. I let him know that he would do this visit from home. He then stated he wasn't going to do an ablation right now. Made pt aware that this is just a discussiom, that Dr. Rayann Heman feel this is most appropriate plan of treatment at this time. Pt declined to make the appt and stated he would call back when ready to discuss.

## 2018-10-09 ENCOUNTER — Other Ambulatory Visit: Payer: Self-pay | Admitting: Internal Medicine

## 2018-10-11 DIAGNOSIS — I110 Hypertensive heart disease with heart failure: Secondary | ICD-10-CM | POA: Diagnosis not present

## 2018-10-11 DIAGNOSIS — L97212 Non-pressure chronic ulcer of right calf with fat layer exposed: Secondary | ICD-10-CM | POA: Diagnosis not present

## 2018-10-11 DIAGNOSIS — B9562 Methicillin resistant Staphylococcus aureus infection as the cause of diseases classified elsewhere: Secondary | ICD-10-CM | POA: Diagnosis not present

## 2018-10-11 DIAGNOSIS — L97215 Non-pressure chronic ulcer of right calf with muscle involvement without evidence of necrosis: Secondary | ICD-10-CM | POA: Diagnosis not present

## 2018-10-11 DIAGNOSIS — G4730 Sleep apnea, unspecified: Secondary | ICD-10-CM | POA: Diagnosis not present

## 2018-10-11 DIAGNOSIS — L97812 Non-pressure chronic ulcer of other part of right lower leg with fat layer exposed: Secondary | ICD-10-CM | POA: Diagnosis not present

## 2018-10-11 DIAGNOSIS — I509 Heart failure, unspecified: Secondary | ICD-10-CM | POA: Diagnosis not present

## 2018-10-11 DIAGNOSIS — E11622 Type 2 diabetes mellitus with other skin ulcer: Secondary | ICD-10-CM | POA: Diagnosis not present

## 2018-10-14 DIAGNOSIS — I110 Hypertensive heart disease with heart failure: Secondary | ICD-10-CM | POA: Diagnosis not present

## 2018-10-14 DIAGNOSIS — I509 Heart failure, unspecified: Secondary | ICD-10-CM | POA: Diagnosis not present

## 2018-10-14 DIAGNOSIS — E11622 Type 2 diabetes mellitus with other skin ulcer: Secondary | ICD-10-CM | POA: Diagnosis not present

## 2018-10-14 DIAGNOSIS — L97212 Non-pressure chronic ulcer of right calf with fat layer exposed: Secondary | ICD-10-CM | POA: Diagnosis not present

## 2018-10-14 DIAGNOSIS — B9562 Methicillin resistant Staphylococcus aureus infection as the cause of diseases classified elsewhere: Secondary | ICD-10-CM | POA: Diagnosis not present

## 2018-10-14 DIAGNOSIS — L97812 Non-pressure chronic ulcer of other part of right lower leg with fat layer exposed: Secondary | ICD-10-CM | POA: Diagnosis not present

## 2018-10-14 DIAGNOSIS — L97215 Non-pressure chronic ulcer of right calf with muscle involvement without evidence of necrosis: Secondary | ICD-10-CM | POA: Diagnosis not present

## 2018-10-14 DIAGNOSIS — G4730 Sleep apnea, unspecified: Secondary | ICD-10-CM | POA: Diagnosis not present

## 2018-10-18 ENCOUNTER — Encounter (HOSPITAL_BASED_OUTPATIENT_CLINIC_OR_DEPARTMENT_OTHER): Payer: BLUE CROSS/BLUE SHIELD | Attending: Internal Medicine

## 2018-10-18 DIAGNOSIS — I87331 Chronic venous hypertension (idiopathic) with ulcer and inflammation of right lower extremity: Secondary | ICD-10-CM | POA: Diagnosis not present

## 2018-10-18 DIAGNOSIS — L97212 Non-pressure chronic ulcer of right calf with fat layer exposed: Secondary | ICD-10-CM | POA: Diagnosis not present

## 2018-10-18 DIAGNOSIS — G4730 Sleep apnea, unspecified: Secondary | ICD-10-CM | POA: Insufficient documentation

## 2018-10-18 DIAGNOSIS — I110 Hypertensive heart disease with heart failure: Secondary | ICD-10-CM | POA: Insufficient documentation

## 2018-10-18 DIAGNOSIS — E11622 Type 2 diabetes mellitus with other skin ulcer: Secondary | ICD-10-CM | POA: Diagnosis not present

## 2018-10-18 DIAGNOSIS — I509 Heart failure, unspecified: Secondary | ICD-10-CM | POA: Insufficient documentation

## 2018-10-21 DIAGNOSIS — I87331 Chronic venous hypertension (idiopathic) with ulcer and inflammation of right lower extremity: Secondary | ICD-10-CM | POA: Diagnosis not present

## 2018-10-21 DIAGNOSIS — I110 Hypertensive heart disease with heart failure: Secondary | ICD-10-CM | POA: Diagnosis not present

## 2018-10-21 DIAGNOSIS — L97812 Non-pressure chronic ulcer of other part of right lower leg with fat layer exposed: Secondary | ICD-10-CM | POA: Diagnosis not present

## 2018-10-21 DIAGNOSIS — L97212 Non-pressure chronic ulcer of right calf with fat layer exposed: Secondary | ICD-10-CM | POA: Diagnosis not present

## 2018-10-21 DIAGNOSIS — I509 Heart failure, unspecified: Secondary | ICD-10-CM | POA: Diagnosis not present

## 2018-10-21 DIAGNOSIS — E11622 Type 2 diabetes mellitus with other skin ulcer: Secondary | ICD-10-CM | POA: Diagnosis not present

## 2018-10-21 DIAGNOSIS — I87311 Chronic venous hypertension (idiopathic) with ulcer of right lower extremity: Secondary | ICD-10-CM | POA: Diagnosis not present

## 2018-10-21 DIAGNOSIS — G4730 Sleep apnea, unspecified: Secondary | ICD-10-CM | POA: Diagnosis not present

## 2018-10-25 DIAGNOSIS — I110 Hypertensive heart disease with heart failure: Secondary | ICD-10-CM | POA: Diagnosis not present

## 2018-10-25 DIAGNOSIS — I509 Heart failure, unspecified: Secondary | ICD-10-CM | POA: Diagnosis not present

## 2018-10-25 DIAGNOSIS — I87331 Chronic venous hypertension (idiopathic) with ulcer and inflammation of right lower extremity: Secondary | ICD-10-CM | POA: Diagnosis not present

## 2018-10-25 DIAGNOSIS — E11622 Type 2 diabetes mellitus with other skin ulcer: Secondary | ICD-10-CM | POA: Diagnosis not present

## 2018-10-25 DIAGNOSIS — G4730 Sleep apnea, unspecified: Secondary | ICD-10-CM | POA: Diagnosis not present

## 2018-10-25 DIAGNOSIS — L97212 Non-pressure chronic ulcer of right calf with fat layer exposed: Secondary | ICD-10-CM | POA: Diagnosis not present

## 2018-10-28 DIAGNOSIS — I87331 Chronic venous hypertension (idiopathic) with ulcer and inflammation of right lower extremity: Secondary | ICD-10-CM | POA: Diagnosis not present

## 2018-10-28 DIAGNOSIS — G4730 Sleep apnea, unspecified: Secondary | ICD-10-CM | POA: Diagnosis not present

## 2018-10-28 DIAGNOSIS — L97212 Non-pressure chronic ulcer of right calf with fat layer exposed: Secondary | ICD-10-CM | POA: Diagnosis not present

## 2018-10-28 DIAGNOSIS — I110 Hypertensive heart disease with heart failure: Secondary | ICD-10-CM | POA: Diagnosis not present

## 2018-10-28 DIAGNOSIS — E11622 Type 2 diabetes mellitus with other skin ulcer: Secondary | ICD-10-CM | POA: Diagnosis not present

## 2018-10-28 DIAGNOSIS — I509 Heart failure, unspecified: Secondary | ICD-10-CM | POA: Diagnosis not present

## 2018-11-01 DIAGNOSIS — E11622 Type 2 diabetes mellitus with other skin ulcer: Secondary | ICD-10-CM | POA: Diagnosis not present

## 2018-11-01 DIAGNOSIS — L97212 Non-pressure chronic ulcer of right calf with fat layer exposed: Secondary | ICD-10-CM | POA: Diagnosis not present

## 2018-11-01 DIAGNOSIS — I87331 Chronic venous hypertension (idiopathic) with ulcer and inflammation of right lower extremity: Secondary | ICD-10-CM | POA: Diagnosis not present

## 2018-11-01 DIAGNOSIS — I110 Hypertensive heart disease with heart failure: Secondary | ICD-10-CM | POA: Diagnosis not present

## 2018-11-01 DIAGNOSIS — I509 Heart failure, unspecified: Secondary | ICD-10-CM | POA: Diagnosis not present

## 2018-11-01 DIAGNOSIS — G4730 Sleep apnea, unspecified: Secondary | ICD-10-CM | POA: Diagnosis not present

## 2018-11-04 DIAGNOSIS — I110 Hypertensive heart disease with heart failure: Secondary | ICD-10-CM | POA: Diagnosis not present

## 2018-11-04 DIAGNOSIS — G4730 Sleep apnea, unspecified: Secondary | ICD-10-CM | POA: Diagnosis not present

## 2018-11-04 DIAGNOSIS — L97212 Non-pressure chronic ulcer of right calf with fat layer exposed: Secondary | ICD-10-CM | POA: Diagnosis not present

## 2018-11-04 DIAGNOSIS — I87331 Chronic venous hypertension (idiopathic) with ulcer and inflammation of right lower extremity: Secondary | ICD-10-CM | POA: Diagnosis not present

## 2018-11-04 DIAGNOSIS — E11622 Type 2 diabetes mellitus with other skin ulcer: Secondary | ICD-10-CM | POA: Diagnosis not present

## 2018-11-04 DIAGNOSIS — I509 Heart failure, unspecified: Secondary | ICD-10-CM | POA: Diagnosis not present

## 2018-11-09 DIAGNOSIS — G4730 Sleep apnea, unspecified: Secondary | ICD-10-CM | POA: Diagnosis not present

## 2018-11-09 DIAGNOSIS — I87331 Chronic venous hypertension (idiopathic) with ulcer and inflammation of right lower extremity: Secondary | ICD-10-CM | POA: Diagnosis not present

## 2018-11-09 DIAGNOSIS — L97212 Non-pressure chronic ulcer of right calf with fat layer exposed: Secondary | ICD-10-CM | POA: Diagnosis not present

## 2018-11-09 DIAGNOSIS — I110 Hypertensive heart disease with heart failure: Secondary | ICD-10-CM | POA: Diagnosis not present

## 2018-11-09 DIAGNOSIS — I509 Heart failure, unspecified: Secondary | ICD-10-CM | POA: Diagnosis not present

## 2018-11-09 DIAGNOSIS — E11622 Type 2 diabetes mellitus with other skin ulcer: Secondary | ICD-10-CM | POA: Diagnosis not present

## 2018-11-12 DIAGNOSIS — L97212 Non-pressure chronic ulcer of right calf with fat layer exposed: Secondary | ICD-10-CM | POA: Diagnosis not present

## 2018-11-12 DIAGNOSIS — E11622 Type 2 diabetes mellitus with other skin ulcer: Secondary | ICD-10-CM | POA: Diagnosis not present

## 2018-11-12 DIAGNOSIS — G4730 Sleep apnea, unspecified: Secondary | ICD-10-CM | POA: Diagnosis not present

## 2018-11-12 DIAGNOSIS — I87331 Chronic venous hypertension (idiopathic) with ulcer and inflammation of right lower extremity: Secondary | ICD-10-CM | POA: Diagnosis not present

## 2018-11-12 DIAGNOSIS — I509 Heart failure, unspecified: Secondary | ICD-10-CM | POA: Diagnosis not present

## 2018-11-12 DIAGNOSIS — I110 Hypertensive heart disease with heart failure: Secondary | ICD-10-CM | POA: Diagnosis not present

## 2018-11-12 DIAGNOSIS — G4733 Obstructive sleep apnea (adult) (pediatric): Secondary | ICD-10-CM | POA: Diagnosis not present

## 2018-11-15 ENCOUNTER — Encounter (HOSPITAL_BASED_OUTPATIENT_CLINIC_OR_DEPARTMENT_OTHER): Payer: BLUE CROSS/BLUE SHIELD | Attending: Internal Medicine

## 2018-11-15 DIAGNOSIS — G4730 Sleep apnea, unspecified: Secondary | ICD-10-CM | POA: Diagnosis not present

## 2018-11-15 DIAGNOSIS — I509 Heart failure, unspecified: Secondary | ICD-10-CM | POA: Diagnosis not present

## 2018-11-15 DIAGNOSIS — L97812 Non-pressure chronic ulcer of other part of right lower leg with fat layer exposed: Secondary | ICD-10-CM | POA: Insufficient documentation

## 2018-11-15 DIAGNOSIS — E119 Type 2 diabetes mellitus without complications: Secondary | ICD-10-CM | POA: Insufficient documentation

## 2018-11-15 DIAGNOSIS — I87321 Chronic venous hypertension (idiopathic) with inflammation of right lower extremity: Secondary | ICD-10-CM | POA: Diagnosis not present

## 2018-11-15 DIAGNOSIS — I110 Hypertensive heart disease with heart failure: Secondary | ICD-10-CM | POA: Insufficient documentation

## 2018-11-15 DIAGNOSIS — L97212 Non-pressure chronic ulcer of right calf with fat layer exposed: Secondary | ICD-10-CM | POA: Insufficient documentation

## 2018-11-16 ENCOUNTER — Other Ambulatory Visit: Payer: Self-pay | Admitting: Internal Medicine

## 2018-11-16 ENCOUNTER — Other Ambulatory Visit: Payer: Self-pay | Admitting: Physician Assistant

## 2018-11-17 ENCOUNTER — Telehealth: Payer: Self-pay | Admitting: Internal Medicine

## 2018-11-17 NOTE — Telephone Encounter (Signed)
°*  STAT* If patient is at the pharmacy, call can be transferred to refill team.   1. Which medications need to be refilled? (please list name of each medication and dose if known)  losartan (COZAAR) 50 MG tablet2x daily  2. Which pharmacy/location (including street and city if local pharmacy) is medication to be sent to? [id number] - [place], [place] - [address]  3. Do they need a 30 day or 90 day supply? 90  Patient is out of medication   Patient had been advised to increase medication to 100mg  2x daily by the wound clinic. Patient had been dispensed 100mg  and splitting the pill in half, but now is taking a full pill 2x a day. [facility] said that the only way they will fill the script is for the doctor to send over a new rx

## 2018-11-17 NOTE — Telephone Encounter (Signed)
Pt calling requesting a refill on losartan 50 mg tablet taking 100 mg tablets BID. These directions are not on pt's medication losartan 50 mg tablet. Please address

## 2018-11-18 DIAGNOSIS — I87311 Chronic venous hypertension (idiopathic) with ulcer of right lower extremity: Secondary | ICD-10-CM | POA: Diagnosis not present

## 2018-11-18 DIAGNOSIS — L97812 Non-pressure chronic ulcer of other part of right lower leg with fat layer exposed: Secondary | ICD-10-CM | POA: Diagnosis not present

## 2018-11-18 DIAGNOSIS — I87321 Chronic venous hypertension (idiopathic) with inflammation of right lower extremity: Secondary | ICD-10-CM | POA: Diagnosis not present

## 2018-11-18 DIAGNOSIS — E119 Type 2 diabetes mellitus without complications: Secondary | ICD-10-CM | POA: Diagnosis not present

## 2018-11-18 DIAGNOSIS — G4730 Sleep apnea, unspecified: Secondary | ICD-10-CM | POA: Diagnosis not present

## 2018-11-18 DIAGNOSIS — L97212 Non-pressure chronic ulcer of right calf with fat layer exposed: Secondary | ICD-10-CM | POA: Diagnosis not present

## 2018-11-18 DIAGNOSIS — I110 Hypertensive heart disease with heart failure: Secondary | ICD-10-CM | POA: Diagnosis not present

## 2018-11-18 DIAGNOSIS — I509 Heart failure, unspecified: Secondary | ICD-10-CM | POA: Diagnosis not present

## 2018-11-18 MED ORDER — LOSARTAN POTASSIUM 50 MG PO TABS: 50.0000 mg | ORAL_TABLET | Freq: Every day | ORAL | 3 refills | Status: DC

## 2018-11-18 NOTE — Telephone Encounter (Signed)
Refilled losartan.  Per review of Pt chart, Pt's losartan is 50 mg one tablet by mouth daily.  If wound clinic has made med change they need to prescribe.

## 2018-11-22 DIAGNOSIS — I509 Heart failure, unspecified: Secondary | ICD-10-CM | POA: Diagnosis not present

## 2018-11-22 DIAGNOSIS — G4730 Sleep apnea, unspecified: Secondary | ICD-10-CM | POA: Diagnosis not present

## 2018-11-22 DIAGNOSIS — E119 Type 2 diabetes mellitus without complications: Secondary | ICD-10-CM | POA: Diagnosis not present

## 2018-11-22 DIAGNOSIS — I110 Hypertensive heart disease with heart failure: Secondary | ICD-10-CM | POA: Diagnosis not present

## 2018-11-22 DIAGNOSIS — L97212 Non-pressure chronic ulcer of right calf with fat layer exposed: Secondary | ICD-10-CM | POA: Diagnosis not present

## 2018-11-22 DIAGNOSIS — L97812 Non-pressure chronic ulcer of other part of right lower leg with fat layer exposed: Secondary | ICD-10-CM | POA: Diagnosis not present

## 2018-11-22 DIAGNOSIS — I87321 Chronic venous hypertension (idiopathic) with inflammation of right lower extremity: Secondary | ICD-10-CM | POA: Diagnosis not present

## 2018-11-23 ENCOUNTER — Telehealth: Payer: Self-pay

## 2018-11-23 NOTE — Telephone Encounter (Signed)
Spoke with pt regarding covid-19 screening prior to appt. Pt stated he does not have symptoms and has not been in contact with anyone who may have covid-19.

## 2018-11-24 ENCOUNTER — Ambulatory Visit (INDEPENDENT_AMBULATORY_CARE_PROVIDER_SITE_OTHER): Payer: BLUE CROSS/BLUE SHIELD | Admitting: Nurse Practitioner

## 2018-11-24 ENCOUNTER — Encounter: Payer: Self-pay | Admitting: Nurse Practitioner

## 2018-11-24 ENCOUNTER — Other Ambulatory Visit: Payer: Self-pay

## 2018-11-24 VITALS — BP 150/104 | HR 64 | Ht 73.0 in | Wt 286.6 lb

## 2018-11-24 DIAGNOSIS — I4891 Unspecified atrial fibrillation: Secondary | ICD-10-CM | POA: Diagnosis not present

## 2018-11-24 DIAGNOSIS — I10 Essential (primary) hypertension: Secondary | ICD-10-CM

## 2018-11-24 DIAGNOSIS — G4733 Obstructive sleep apnea (adult) (pediatric): Secondary | ICD-10-CM | POA: Diagnosis not present

## 2018-11-24 DIAGNOSIS — I5022 Chronic systolic (congestive) heart failure: Secondary | ICD-10-CM | POA: Diagnosis not present

## 2018-11-24 DIAGNOSIS — I4819 Other persistent atrial fibrillation: Secondary | ICD-10-CM | POA: Diagnosis not present

## 2018-11-24 MED ORDER — LOSARTAN POTASSIUM 100 MG PO TABS: 100.0000 mg | ORAL_TABLET | Freq: Two times a day (BID) | ORAL | 3 refills | Status: DC

## 2018-11-24 NOTE — H&P (View-Only) (Signed)
Electrophysiology Office Note Date: 11/25/2018  ID:  [name], DOB April 30, 1956, MRN [id number]  PCP: [name] Koch, MD Electrophysiologist: [name]  CC: AF follow up  [name] is a 63 y.o. male seen today for Dr [name].  He presents today for routine electrophysiology followup. He is post flutter ablation in 2012 and has had recurrent persistent atrial fibrillation. Last cardioversion was in January of 2020 with ERAF. He was scheduled to discuss AF ablation but this was put on hold with COVID. Since then, he also developed leg ulcer which has required venous ablation and skin grafts. He has 1 more skin graft to be done on 6/18.  He has been taking Xarelto with no missed doses in the last 4 weeks and has not had to hold [name] for procedures. His BP at home as been around 810 systolic on increased dose of Losartan. He ran out of medication yesterday.  He has shortness of breath with exertion.  He denies chest pain, palpitations, PND, orthopnea, nausea, vomiting, dizziness, syncope, edema, weight gain, or early satiety.  Past Medical History:  Diagnosis Date  . Allergy    SEASONAL  . Asthma   . Atrial fibrillation ([place])    persistant 02/2009  . Atrial flutter ([place])    s/p CTI ablation 08/06/10  . Cardiac arrhythmia due to congenital heart disease   . Cataract    BEGINING  . Chronic rhinitis   . Chronic systolic dysfunction of left ventricle   . Colon polyp   . Congestive heart failure ([place])   . Diverticulosis    colonoscopy 04/03/2009  . Hyperlipidemia   . Hypertension   . Hypertension   . Morbid obesity ([place])    target weight = 219  for BMI < 30  . Nonischemic cardiomyopathy ([facility])    tachycardia mediated  . Sleep apnea    original 2001   Past Surgical History:  Procedure Laterality Date  . atrial flutter ablation  08/06/10  . CARDIOVERSION N/A 07/05/2018   Procedure: CARDIOVERSION;  Surgeon: [name] Dresser, MD;  Location: [facility] ENDOSCOPY;  Service:  Cardiovascular;  Laterality: N/A;  . COLONOSCOPY      Current Outpatient Medications  Medication Sig Dispense Refill  . albuterol (PROVENTIL HFA;VENTOLIN HFA) 108 (90 Base) MCG/ACT inhaler Inhale 2 puffs into the lungs every 6 (six) hours as needed for wheezing or shortness of breath.    [name] amiodarone (PACERONE) 200 MG tablet Take 1 tablet (200 mg total) by mouth daily. 180 tablet 3  . carvedilol (COREG) 25 MG tablet Take 0.5 tablets (12.5 mg total) by mouth 2 (two) times daily with a meal. 180 tablet 3  . cefdinir (OMNICEF) 300 MG capsule     . furosemide (LASIX) 80 MG tablet TAKE 1/2-1 TABLET BY MOUTH DAILY AS NEEDED FOR FLUID OR EDEMA. 90 tablet 0  . gabapentin (NEURONTIN) 300 MG capsule TAKE 1 CAPSULE BY MOUTH THREE TIMES A DAY 270 capsule 2  . glipiZIDE (GLUCOTROL) 5 MG tablet TAKE 1 TABLET BY MOUTH TWICE A DAY BEFORE MEALS/ NEED APPOINTMENT 60 tablet 0  . KLOR-CON M20 20 MEQ tablet TAKE 1 TABLET BY MOUTH TWICE A DAY 180 tablet 3  . losartan (COZAAR) 100 MG tablet Take 1 tablet (100 mg total) by mouth 2 (two) times daily. 180 tablet 3  . mometasone (NASONEX) 50 MCG/ACT nasal spray Place 2 sprays into the nose daily as needed (allergies or rhinitis).     . rosuvastatin (CRESTOR) 10 MG  tablet TAKE 1 TABLET BY MOUTH EVERY DAY 90 tablet 0  . sildenafil (VIAGRA) 100 MG tablet Take 1 tablet (100 mg total) by mouth as directed. 15 tablet 3  . SYMBICORT 80-4.5 MCG/ACT inhaler USE 2 INHALATIONS TWICE A DAY 30.6 g 1  . traMADol (ULTRAM) 50 MG tablet     . XARELTO 20 MG TABS tablet TAKE 1 TABLET (20 MG TOTAL) BY MOUTH DAILY WITH SUPPER. 90 tablet 1   No current facility-administered medications for this visit.     Allergies:   Patient has no known allergies.   Social History: Social History   Socioeconomic History  . Marital status: Single    Spouse name: Not on file  . Number of children: 0  . Years of education: Not on file  . Highest education level: Not on file  Occupational History   . Occupation: Press photographer  Social Needs  . Financial resource strain: Not on file  . Food insecurity    Worry: Not on file    Inability: Not on file  . Transportation needs    Medical: Not on file    Non-medical: Not on file  Tobacco Use  . Smoking status: Never Smoker  . Smokeless tobacco: Never Used  Substance and Sexual Activity  . Alcohol use: Yes    Alcohol/week: 0.0 standard drinks    Comment: previously quite heavy, working on cessation   . Drug use: No  . Sexual activity: Not on file  Lifestyle  . Physical activity    Days per week: Not on file    Minutes per session: Not on file  . Stress: Not on file  Relationships  . Social Herbalist on phone: Not on file    Gets together: Not on file    Attends religious service: Not on file    Active member of club or organization: Not on file    Attends meetings of clubs or organizations: Not on file    Relationship status: Not on file  . Intimate partner violence    Fear of current or ex partner: Not on file    Emotionally abused: Not on file    Physically abused: Not on file    Forced sexual activity: Not on file  Other Topics Concern  . Not on file  Social History Narrative   Lives in [place] and works for [name]    Family History: Family History  Problem Relation Age of Onset  . Hypertension Mother   . Kidney failure Mother        Due to sepsis  . Colon cancer Father 62  . COPD Father   . Colon cancer Brother   . Colon polyps Neg Hx   . Diabetes Neg Hx   . Kidney disease Neg Hx   . Esophageal cancer Neg Hx     Review of Systems: All other systems reviewed and are otherwise negative except as noted above.   Physical Exam: VS:  BP (!) 150/104   Pulse 64   Ht 6\' 1"  (1.854 m)   Wt 286 lb 9.6 oz (130 kg)   SpO2 97%   BMI 37.81 kg/m  , BMI Body mass index is 37.81 kg/m. Wt Readings from Last 3 Encounters:  11/24/18 286 lb 9.6 oz (130 kg)  09/22/18 284 lb (128.8 kg)  07/27/18 298 lb  (135.2 kg)    GEN- The patient is obese appearing, alert and oriented x 3 today.   HEENT: normocephalic,  atraumatic; sclera clear, conjunctiva pink; hearing intact; oropharynx clear; neck supple  Lungs- Clear to ausculation bilaterally, normal work of breathing.  No wheezes, rales, rhonchi Heart- Irregular rate and rhythm  GI- soft, non-tender, non-distended, bowel sounds present  Extremities- no clubbing, cyanosis, left leg wrapped in bandage from knee down MS- no significant deformity or atrophy Skin- warm and dry, no rash or lesion  Psych- euthymic mood, full affect Neuro- strength and sensation are intact   EKG:  EKG is ordered today. The ekg ordered today shows atrial fibrillation, rate 64, RBBB, LAFB  Recent Labs: 04/07/2018: BNP 367.1 05/03/2018: ALT 16; TSH 4.800 11/24/2018: BUN WILL FOLLOW; Creatinine, Ser WILL FOLLOW; Hemoglobin 16.4; Magnesium WILL FOLLOW; Platelets 196; Potassium WILL FOLLOW; Sodium WILL FOLLOW    Other studies Reviewed: Additional studies/ records that were reviewed today include: Dr [name] and AF clinic notes  Assessment and Plan:  1.  Persistent atrial fibrillation S/p DCCV 06/2018 with ERAF with concern for tachycardia mediated cardiomyopathy Continue Xarelto for CHADS2VASC of 2 Reviewed treatment options today and discussed with Dr [name]. Risks, benefits to PVI reviewed with the patient today who wishes to proceed. Last skin graft is scheduled for 6/18. He has not missed any doses of Xarelto and is not being asked to hold Xarelto for procedures. Dates given to patient today - he will check with his brother and let us know of dates that work for him. Will need TEE on the table prior to ablation. Will need to be scheduled with CARTO, ice, anesthesia.   2.  Chronic systolic heart failure - presumed tachycardia mediated Continue current meds BMET, Mg, CBC today  3.  OSA Compliant with CPAP  4.  HTN Continue increased dose of Losartan  5.   Obesity Body mass index is 37.81 kg/m. Weight loss encouraged      Current medicines are reviewed at length with the patient today.   The patient does not have concerns regarding his medicines.  The following changes were made today:  none  Labs/ tests ordered today include:  Orders Placed This Encounter  Procedures  . Basic Metabolic Panel (BMET)  . CBC  . Magnesium  . EKG 12-Lead     Disposition:   Follow up with Sonia Baller by phone to schedule AF ablation     Signed, Chanetta Marshall, NP 11/25/2018 8:30 AM   [place] [address] [phone] (office) [phone] (fax)

## 2018-11-24 NOTE — Progress Notes (Signed)
Electrophysiology Office Note Date: 11/25/2018  ID:  [name], DOB February 15, 1956, MRN [id number]  PCP: [name], MD Electrophysiologist: Rayann Heman  CC: AF follow up  [name] is a 63 y.o. male seen today for Dr Rayann Heman.  He presents today for routine electrophysiology followup. He is post flutter ablation in 2012 and has had recurrent persistent atrial fibrillation. Last cardioversion was in January of 2020 with ERAF. He was scheduled to discuss AF ablation but this was put on hold with COVID. Since then, he also developed leg ulcer which has required venous ablation and skin grafts. He has 1 more skin graft to be done on 6/18.  He has been taking Xarelto with no missed doses in the last 4 weeks and has not had to hold [facility] for procedures. His BP at home as been around 151 systolic on increased dose of Losartan. He ran out of medication yesterday.  He has shortness of breath with exertion.  He denies chest pain, palpitations, PND, orthopnea, nausea, vomiting, dizziness, syncope, edema, weight gain, or early satiety.  Past Medical History:  Diagnosis Date  . Allergy    SEASONAL  . Asthma   . Atrial fibrillation ([place])    persistant 02/2009  . Atrial flutter ([place])    s/p CTI ablation 08/06/10  . Cardiac arrhythmia due to congenital heart disease   . Cataract    BEGINING  . Chronic rhinitis   . Chronic systolic dysfunction of left ventricle   . Colon polyp   . Congestive heart failure ([place])   . Diverticulosis    colonoscopy 04/03/2009  . Hyperlipidemia   . Hypertension   . Hypertension   . Morbid obesity ([place])    target weight = 219  for BMI < 30  . Nonischemic cardiomyopathy ([facility])    tachycardia mediated  . Sleep apnea    original 2001   Past Surgical History:  Procedure Laterality Date  . atrial flutter ablation  08/06/10  . CARDIOVERSION N/A 07/05/2018   Procedure: CARDIOVERSION;  Surgeon: Buford Dresser, MD;  Location: [facility] ENDOSCOPY;  Service:  Cardiovascular;  Laterality: N/A;  . COLONOSCOPY      Current Outpatient Medications  Medication Sig Dispense Refill  . albuterol (PROVENTIL HFA;VENTOLIN HFA) 108 (90 Base) MCG/ACT inhaler Inhale 2 puffs into the lungs every 6 (six) hours as needed for wheezing or shortness of breath.    [name] amiodarone (PACERONE) 200 MG tablet Take 1 tablet (200 mg total) by mouth daily. 180 tablet 3  . carvedilol (COREG) 25 MG tablet Take 0.5 tablets (12.5 mg total) by mouth 2 (two) times daily with a meal. 180 tablet 3  . cefdinir (OMNICEF) 300 MG capsule     . furosemide (LASIX) 80 MG tablet TAKE 1/2-1 TABLET BY MOUTH DAILY AS NEEDED FOR FLUID OR EDEMA. 90 tablet 0  . gabapentin (NEURONTIN) 300 MG capsule TAKE 1 CAPSULE BY MOUTH THREE TIMES A DAY 270 capsule 2  . glipiZIDE (GLUCOTROL) 5 MG tablet TAKE 1 TABLET BY MOUTH TWICE A DAY BEFORE MEALS/ NEED APPOINTMENT 60 tablet 0  . KLOR-CON M20 20 MEQ tablet TAKE 1 TABLET BY MOUTH TWICE A DAY 180 tablet 3  . losartan (COZAAR) 100 MG tablet Take 1 tablet (100 mg total) by mouth 2 (two) times daily. 180 tablet 3  . mometasone (NASONEX) 50 MCG/ACT nasal spray Place 2 sprays into the nose daily as needed (allergies or rhinitis).     . rosuvastatin (CRESTOR) 10 MG  tablet TAKE 1 TABLET BY MOUTH EVERY DAY 90 tablet 0  . sildenafil (VIAGRA) 100 MG tablet Take 1 tablet (100 mg total) by mouth as directed. 15 tablet 3  . SYMBICORT 80-4.5 MCG/ACT inhaler USE 2 INHALATIONS TWICE A DAY 30.6 g 1  . traMADol (ULTRAM) 50 MG tablet     . XARELTO 20 MG TABS tablet TAKE 1 TABLET (20 MG TOTAL) BY MOUTH DAILY WITH SUPPER. 90 tablet 1   No current facility-administered medications for this visit.     Allergies:   Patient has no known allergies.   Social History: Social History   Socioeconomic History  . Marital status: Single    Spouse name: Not on file  . Number of children: 0  . Years of education: Not on file  . Highest education level: Not on file  Occupational History   . Occupation: Press photographer  Social Needs  . Financial resource strain: Not on file  . Food insecurity    Worry: Not on file    Inability: Not on file  . Transportation needs    Medical: Not on file    Non-medical: Not on file  Tobacco Use  . Smoking status: Never Smoker  . Smokeless tobacco: Never Used  Substance and Sexual Activity  . Alcohol use: Yes    Alcohol/week: 0.0 standard drinks    Comment: previously quite heavy, working on cessation   . Drug use: No  . Sexual activity: Not on file  Lifestyle  . Physical activity    Days per week: Not on file    Minutes per session: Not on file  . Stress: Not on file  Relationships  . Social Herbalist on phone: Not on file    Gets together: Not on file    Attends religious service: Not on file    Active member of club or organization: Not on file    Attends meetings of clubs or organizations: Not on file    Relationship status: Not on file  . Intimate partner violence    Fear of current or ex partner: Not on file    Emotionally abused: Not on file    Physically abused: Not on file    Forced sexual activity: Not on file  Other Topics Concern  . Not on file  Social History Narrative   Lives in [place] and works for [name]    Family History: Family History  Problem Relation Age of Onset  . Hypertension Mother   . Kidney failure Mother        Due to sepsis  . Colon cancer Father 1  . COPD Father   . Colon cancer Brother   . Colon polyps Neg Hx   . Diabetes Neg Hx   . Kidney disease Neg Hx   . Esophageal cancer Neg Hx     Review of Systems: All other systems reviewed and are otherwise negative except as noted above.   Physical Exam: VS:  BP (!) 150/104   Pulse 64   Ht 6\' 1"  (1.854 m)   Wt 286 lb 9.6 oz (130 kg)   SpO2 97%   BMI 37.81 kg/m  , BMI Body mass index is 37.81 kg/m. Wt Readings from Last 3 Encounters:  11/24/18 286 lb 9.6 oz (130 kg)  09/22/18 284 lb (128.8 kg)  07/27/18 298 lb  (135.2 kg)    GEN- The patient is obese appearing, alert and oriented x 3 today.   HEENT: normocephalic,  atraumatic; sclera clear, conjunctiva pink; hearing intact; oropharynx clear; neck supple  Lungs- Clear to ausculation bilaterally, normal work of breathing.  No wheezes, rales, rhonchi Heart- Irregular rate and rhythm  GI- soft, non-tender, non-distended, bowel sounds present  Extremities- no clubbing, cyanosis, left leg wrapped in bandage from knee down MS- no significant deformity or atrophy Skin- warm and dry, no rash or lesion  Psych- euthymic mood, full affect Neuro- strength and sensation are intact   EKG:  EKG is ordered today. The ekg ordered today shows atrial fibrillation, rate 64, RBBB, LAFB  Recent Labs: 04/07/2018: BNP 367.1 05/03/2018: ALT 16; TSH 4.800 11/24/2018: BUN WILL FOLLOW; Creatinine, Ser WILL FOLLOW; Hemoglobin 16.4; Magnesium WILL FOLLOW; Platelets 196; Potassium WILL FOLLOW; Sodium WILL FOLLOW    Other studies Reviewed: Additional studies/ records that were reviewed today include: Dr Rayann Heman and AF clinic notes  Assessment and Plan:  1.  Persistent atrial fibrillation S/p DCCV 06/2018 with ERAF with concern for tachycardia mediated cardiomyopathy Continue Xarelto for CHADS2VASC of 2 Reviewed treatment options today and discussed with Dr Rayann Heman. Risks, benefits to PVI reviewed with the patient today who wishes to proceed. Last skin graft is scheduled for 6/18. He has not missed any doses of Xarelto and is not being asked to hold Xarelto for procedures. Dates given to patient today - he will check with his brother and let us know of dates that work for him. Will need TEE on the table prior to ablation. Will need to be scheduled with CARTO, ice, anesthesia.   2.  Chronic systolic heart failure - presumed tachycardia mediated Continue current meds BMET, Mg, CBC today  3.  OSA Compliant with CPAP  4.  HTN Continue increased dose of Losartan  5.   Obesity Body mass index is 37.81 kg/m. Weight loss encouraged      Current medicines are reviewed at length with the patient today.   The patient does not have concerns regarding his medicines.  The following changes were made today:  none  Labs/ tests ordered today include:  Orders Placed This Encounter  Procedures  . Basic Metabolic Panel (BMET)  . CBC  . Magnesium  . EKG 12-Lead     Disposition:   Follow up with Sonia Baller by phone to schedule AF ablation     Signed, Chanetta Marshall, NP 11/25/2018 8:30 AM   [address] [place] [address] [phone] (office) [phone] (fax)

## 2018-11-24 NOTE — Patient Instructions (Addendum)
  Medication Instructions:  Losartan 100 mg TWICE per DAY If you need a refill on your cardiac medications before your next appointment, please call your pharmacy.   Lab work:TODAY CBC MAGNESIUM BMET If you have labs (blood work) drawn today and your tests are completely normal, you will receive your results only by: [name] MyChart Message (if you have MyChart) OR . A paper copy in the mail If you have any lab test that is abnormal or we need to change your treatment, we will call you to review the results.  Testing/Procedures: NONE  Follow-Up: To Be Determined At [facility], you and your health needs are our priority.  As part of our continuing mission to provide you with exceptional heart care, we have created designated Provider Care Teams.  These Care Teams include your primary Cardiologist (physician) and Advanced Practice Providers (APPs -  Physician Assistants and Nurse Practitioners) who all work together to provide you with the care you need, when you need it. .   Any Other Special Instructions Will Be Listed Below (If Applicable). PLEASE Vergie Living, Dr Allred's nurse to schedule ablation [phone]  Dates Available: June 23rd Tuesday June 25th  Thursday June 30th   Tuesday July 2nd  Thursday July 9th   Thursday

## 2018-11-25 ENCOUNTER — Telehealth: Payer: Self-pay

## 2018-11-25 DIAGNOSIS — E119 Type 2 diabetes mellitus without complications: Secondary | ICD-10-CM | POA: Diagnosis not present

## 2018-11-25 DIAGNOSIS — I87321 Chronic venous hypertension (idiopathic) with inflammation of right lower extremity: Secondary | ICD-10-CM | POA: Diagnosis not present

## 2018-11-25 DIAGNOSIS — I509 Heart failure, unspecified: Secondary | ICD-10-CM | POA: Diagnosis not present

## 2018-11-25 DIAGNOSIS — L97212 Non-pressure chronic ulcer of right calf with fat layer exposed: Secondary | ICD-10-CM | POA: Diagnosis not present

## 2018-11-25 DIAGNOSIS — I110 Hypertensive heart disease with heart failure: Secondary | ICD-10-CM | POA: Diagnosis not present

## 2018-11-25 DIAGNOSIS — G4730 Sleep apnea, unspecified: Secondary | ICD-10-CM | POA: Diagnosis not present

## 2018-11-25 DIAGNOSIS — L97812 Non-pressure chronic ulcer of other part of right lower leg with fat layer exposed: Secondary | ICD-10-CM | POA: Diagnosis not present

## 2018-11-25 LAB — BASIC METABOLIC PANEL
BUN/Creatinine Ratio: 16 (ref 10–24)
BUN: 28 mg/dL — ABNORMAL HIGH (ref 8–27)
CO2: 27 mmol/L (ref 20–29)
Calcium: 8.9 mg/dL (ref 8.6–10.2)
Chloride: 99 mmol/L (ref 96–106)
Creatinine, Ser: 1.7 mg/dL — ABNORMAL HIGH (ref 0.76–1.27)
GFR calc Af Amer: 49 mL/min/{1.73_m2} — ABNORMAL LOW (ref >59)
GFR calc non Af Amer: 42 mL/min/{1.73_m2} — ABNORMAL LOW (ref >59)
Glucose: 164 mg/dL — ABNORMAL HIGH (ref 65–99)
Potassium: 4.3 mmol/L (ref 3.5–5.2)
Sodium: 141 mmol/L (ref 134–144)

## 2018-11-25 LAB — CBC
Hematocrit: 50.8 % (ref 37.5–51.0)
Hemoglobin: 16.4 g/dL (ref 13.0–17.7)
MCH: 29 pg (ref 26.6–33.0)
MCHC: 32.3 g/dL (ref 31.5–35.7)
MCV: 90 fL (ref 79–97)
Platelets: 196 10*3/uL (ref 150–450)
RBC: 5.65 x10E6/uL (ref 4.14–5.80)
RDW: 15 % (ref 11.6–15.4)
WBC: 7 10*3/uL (ref 3.4–10.8)

## 2018-11-25 LAB — MAGNESIUM: Magnesium: 1.9 mg/dL (ref 1.6–2.3)

## 2018-11-25 NOTE — Telephone Encounter (Signed)
Notes recorded by Frederik Schmidt, RN on 11/25/2018 at 1:07 PM EDT  The patient has been notified of the result and verbalized understanding. All questions (if any) were answered.  Frederik Schmidt, RN 11/25/2018 1:06 PM

## 2018-11-25 NOTE — Telephone Encounter (Signed)
-----   Message from [name] Berthold, NP sent at 11/25/2018  1:01 PM EDT ----- Please notify patient of stable labs. Thanks!

## 2018-11-29 ENCOUNTER — Telehealth: Payer: Self-pay

## 2018-11-29 DIAGNOSIS — L97812 Non-pressure chronic ulcer of other part of right lower leg with fat layer exposed: Secondary | ICD-10-CM | POA: Diagnosis not present

## 2018-11-29 DIAGNOSIS — E119 Type 2 diabetes mellitus without complications: Secondary | ICD-10-CM | POA: Diagnosis not present

## 2018-11-29 DIAGNOSIS — L97212 Non-pressure chronic ulcer of right calf with fat layer exposed: Secondary | ICD-10-CM | POA: Diagnosis not present

## 2018-11-29 DIAGNOSIS — I87321 Chronic venous hypertension (idiopathic) with inflammation of right lower extremity: Secondary | ICD-10-CM | POA: Diagnosis not present

## 2018-11-29 DIAGNOSIS — I509 Heart failure, unspecified: Secondary | ICD-10-CM | POA: Diagnosis not present

## 2018-11-29 DIAGNOSIS — G4730 Sleep apnea, unspecified: Secondary | ICD-10-CM | POA: Diagnosis not present

## 2018-11-29 DIAGNOSIS — I110 Hypertensive heart disease with heart failure: Secondary | ICD-10-CM | POA: Diagnosis not present

## 2018-11-29 NOTE — Telephone Encounter (Signed)
Call received from Pt.  Pt would like to schedule afib ablation on December 14, 2018 first case  He will come for covid test on December 10 2018 at [place].  Verbal instruction given.  Please arrive at the [facility] main entrance of [facility] hospital at:  5:30 am on June 30  Do not eat or drink after midnight prior to procedure Do not take any medications the morning of the procedure  Plan for one night stay  You will need someone to drive you home at discharge

## 2018-12-02 ENCOUNTER — Telehealth: Payer: Self-pay | Admitting: Internal Medicine

## 2018-12-02 DIAGNOSIS — G4730 Sleep apnea, unspecified: Secondary | ICD-10-CM | POA: Diagnosis not present

## 2018-12-02 DIAGNOSIS — I509 Heart failure, unspecified: Secondary | ICD-10-CM | POA: Diagnosis not present

## 2018-12-02 DIAGNOSIS — I110 Hypertensive heart disease with heart failure: Secondary | ICD-10-CM | POA: Diagnosis not present

## 2018-12-02 DIAGNOSIS — E119 Type 2 diabetes mellitus without complications: Secondary | ICD-10-CM | POA: Diagnosis not present

## 2018-12-02 DIAGNOSIS — I87311 Chronic venous hypertension (idiopathic) with ulcer of right lower extremity: Secondary | ICD-10-CM | POA: Diagnosis not present

## 2018-12-02 DIAGNOSIS — I10 Essential (primary) hypertension: Secondary | ICD-10-CM

## 2018-12-02 DIAGNOSIS — L97812 Non-pressure chronic ulcer of other part of right lower leg with fat layer exposed: Secondary | ICD-10-CM | POA: Diagnosis not present

## 2018-12-02 DIAGNOSIS — L97212 Non-pressure chronic ulcer of right calf with fat layer exposed: Secondary | ICD-10-CM | POA: Diagnosis not present

## 2018-12-02 DIAGNOSIS — L97819 Non-pressure chronic ulcer of other part of right lower leg with unspecified severity: Secondary | ICD-10-CM | POA: Diagnosis not present

## 2018-12-02 DIAGNOSIS — I87321 Chronic venous hypertension (idiopathic) with inflammation of right lower extremity: Secondary | ICD-10-CM | POA: Diagnosis not present

## 2018-12-02 MED ORDER — SPIRONOLACTONE 25 MG PO TABS: 12.5000 mg | ORAL_TABLET | Freq: Every day | ORAL | 3 refills | Status: DC

## 2018-12-02 NOTE — Telephone Encounter (Signed)
Per Dr. Rayann Heman start spironolactone 12.5 mg one tablet by mouth daily.  Will need to follow for labs.  Pt has ablation scheduled for June 30 and has had recent labs.  Will message pharmacy for guidance and lab follow up

## 2018-12-02 NOTE — Telephone Encounter (Signed)
New message   Pt c/o BP issue: STAT if pt c/o blurred vision, one-sided weakness or slurred speech  1. What are your last 5 BP readings? 163/125 bp  154/125 bp 160/122 bp 160/120 bp   2. Are you having any other symptoms (ex. Dizziness, headache, blurred vision, passed out)? No   3. What is your BP issue? Patient has elevated b/p

## 2018-12-06 ENCOUNTER — Other Ambulatory Visit (HOSPITAL_COMMUNITY)
Admission: RE | Admit: 2018-12-06 | Discharge: 2018-12-06 | Disposition: A | Discharge status: Home / Self Care | Payer: BLUE CROSS/BLUE SHIELD | Source: Other Acute Inpatient Hospital | Attending: Internal Medicine | Admitting: Internal Medicine

## 2018-12-06 DIAGNOSIS — L97212 Non-pressure chronic ulcer of right calf with fat layer exposed: Secondary | ICD-10-CM | POA: Insufficient documentation

## 2018-12-06 DIAGNOSIS — L97812 Non-pressure chronic ulcer of other part of right lower leg with fat layer exposed: Secondary | ICD-10-CM | POA: Diagnosis not present

## 2018-12-06 DIAGNOSIS — I110 Hypertensive heart disease with heart failure: Secondary | ICD-10-CM | POA: Diagnosis not present

## 2018-12-06 DIAGNOSIS — I509 Heart failure, unspecified: Secondary | ICD-10-CM | POA: Diagnosis not present

## 2018-12-06 DIAGNOSIS — G4730 Sleep apnea, unspecified: Secondary | ICD-10-CM | POA: Diagnosis not present

## 2018-12-06 DIAGNOSIS — I87321 Chronic venous hypertension (idiopathic) with inflammation of right lower extremity: Secondary | ICD-10-CM | POA: Diagnosis not present

## 2018-12-06 DIAGNOSIS — E119 Type 2 diabetes mellitus without complications: Secondary | ICD-10-CM | POA: Diagnosis not present

## 2018-12-08 LAB — AEROBIC CULTURE W GRAM STAIN (SUPERFICIAL SPECIMEN)

## 2018-12-09 DIAGNOSIS — G4730 Sleep apnea, unspecified: Secondary | ICD-10-CM | POA: Diagnosis not present

## 2018-12-09 DIAGNOSIS — I509 Heart failure, unspecified: Secondary | ICD-10-CM | POA: Diagnosis not present

## 2018-12-09 DIAGNOSIS — L97212 Non-pressure chronic ulcer of right calf with fat layer exposed: Secondary | ICD-10-CM | POA: Diagnosis not present

## 2018-12-09 DIAGNOSIS — E119 Type 2 diabetes mellitus without complications: Secondary | ICD-10-CM | POA: Diagnosis not present

## 2018-12-09 DIAGNOSIS — I87321 Chronic venous hypertension (idiopathic) with inflammation of right lower extremity: Secondary | ICD-10-CM | POA: Diagnosis not present

## 2018-12-09 DIAGNOSIS — L97812 Non-pressure chronic ulcer of other part of right lower leg with fat layer exposed: Secondary | ICD-10-CM | POA: Diagnosis not present

## 2018-12-09 DIAGNOSIS — I110 Hypertensive heart disease with heart failure: Secondary | ICD-10-CM | POA: Diagnosis not present

## 2018-12-10 ENCOUNTER — Other Ambulatory Visit (HOSPITAL_COMMUNITY)
Admission: RE | Admit: 2018-12-10 | Discharge: 2018-12-10 | Disposition: A | Discharge status: Home / Self Care | Payer: BLUE CROSS/BLUE SHIELD | Source: Ambulatory Visit | Attending: Internal Medicine | Admitting: Internal Medicine

## 2018-12-10 DIAGNOSIS — Z1159 Encounter for screening for other viral diseases: Secondary | ICD-10-CM | POA: Diagnosis not present

## 2018-12-10 LAB — SARS CORONAVIRUS 2 (TAT 6-24 HRS): SARS Coronavirus 2: NEGATIVE

## 2018-12-13 DIAGNOSIS — E119 Type 2 diabetes mellitus without complications: Secondary | ICD-10-CM | POA: Diagnosis not present

## 2018-12-13 DIAGNOSIS — L97212 Non-pressure chronic ulcer of right calf with fat layer exposed: Secondary | ICD-10-CM | POA: Diagnosis not present

## 2018-12-13 DIAGNOSIS — G4730 Sleep apnea, unspecified: Secondary | ICD-10-CM | POA: Diagnosis not present

## 2018-12-13 DIAGNOSIS — L97812 Non-pressure chronic ulcer of other part of right lower leg with fat layer exposed: Secondary | ICD-10-CM | POA: Diagnosis not present

## 2018-12-13 DIAGNOSIS — I87321 Chronic venous hypertension (idiopathic) with inflammation of right lower extremity: Secondary | ICD-10-CM | POA: Diagnosis not present

## 2018-12-13 DIAGNOSIS — I509 Heart failure, unspecified: Secondary | ICD-10-CM | POA: Diagnosis not present

## 2018-12-13 DIAGNOSIS — I110 Hypertensive heart disease with heart failure: Secondary | ICD-10-CM | POA: Diagnosis not present

## 2018-12-13 NOTE — Anesthesia Preprocedure Evaluation (Addendum)
Anesthesia Evaluation  Patient identified by MRN, date of birth, ID band Patient awake    Reviewed: Allergy & Precautions, NPO status , Patient's Chart, lab work & pertinent test results, reviewed documented beta blocker date and time   Airway Mallampati: II  TM Distance: >3 FB Neck ROM: Full    Dental  (+) Missing   Pulmonary asthma , sleep apnea ,    Pulmonary exam normal breath sounds clear to auscultation       Cardiovascular hypertension, Pt. on home beta blockers and Pt. on medications +CHF  Normal cardiovascular exam+ dysrhythmias Atrial Fibrillation  Rhythm:Regular Rate:Normal  ECG: rate 65. Atrial fibrillation Right bundle branch block Left anterior fascicular block Bifascicular block   ECHO (2019) LV EF: 30% -   35%   Neuro/Psych PSYCHIATRIC DISORDERS Anxiety negative neurological ROS     GI/Hepatic negative GI ROS, Neg liver ROS,   Endo/Other  diabetes, Oral Hypoglycemic Agents  Renal/GU negative Renal ROS     Musculoskeletal negative musculoskeletal ROS (+)   Abdominal (+) + obese,   Peds  Hematology HLD   Anesthesia Other Findings A-fib  Reproductive/Obstetrics                            Anesthesia Physical Anesthesia Plan  ASA: III  Anesthesia Plan: General   Post-op Pain Management:    Induction: Intravenous  PONV Risk Score and Plan: 2 and Ondansetron, Dexamethasone, Midazolam and Treatment may vary due to age or medical condition  Airway Management Planned: Oral ETT  Additional Equipment:   Intra-op Plan:   Post-operative Plan: Extubation in OR  Informed Consent: I have reviewed the patients History and Physical, chart, labs and discussed the procedure including the risks, benefits and alternatives for the proposed anesthesia with the patient or authorized representative who has indicated his/her understanding and acceptance.     Dental advisory  given  Plan Discussed with: CRNA  Anesthesia Plan Comments:        Anesthesia Quick Evaluation

## 2018-12-14 ENCOUNTER — Ambulatory Visit (HOSPITAL_BASED_OUTPATIENT_CLINIC_OR_DEPARTMENT_OTHER): Payer: BLUE CROSS/BLUE SHIELD

## 2018-12-14 ENCOUNTER — Encounter (HOSPITAL_COMMUNITY): Payer: Self-pay | Admitting: Internal Medicine

## 2018-12-14 ENCOUNTER — Ambulatory Visit (HOSPITAL_COMMUNITY): Payer: BLUE CROSS/BLUE SHIELD | Admitting: Certified Registered Nurse Anesthetist

## 2018-12-14 ENCOUNTER — Encounter (HOSPITAL_COMMUNITY)
Admission: RE | Disposition: A | Discharge status: Home / Self Care | Payer: Self-pay | Source: Home / Self Care | Attending: Internal Medicine

## 2018-12-14 ENCOUNTER — Telehealth: Payer: Self-pay

## 2018-12-14 ENCOUNTER — Other Ambulatory Visit: Payer: Self-pay

## 2018-12-14 ENCOUNTER — Ambulatory Visit (HOSPITAL_COMMUNITY)
Admission: RE | Admit: 2018-12-14 | Discharge: 2018-12-14 | Disposition: A | Discharge status: Home / Self Care | Payer: BLUE CROSS/BLUE SHIELD | Attending: Internal Medicine | Admitting: Internal Medicine

## 2018-12-14 DIAGNOSIS — G4733 Obstructive sleep apnea (adult) (pediatric): Secondary | ICD-10-CM | POA: Insufficient documentation

## 2018-12-14 DIAGNOSIS — I110 Hypertensive heart disease with heart failure: Secondary | ICD-10-CM | POA: Diagnosis not present

## 2018-12-14 DIAGNOSIS — I428 Other cardiomyopathies: Secondary | ICD-10-CM | POA: Diagnosis not present

## 2018-12-14 DIAGNOSIS — I4819 Other persistent atrial fibrillation: Secondary | ICD-10-CM | POA: Diagnosis not present

## 2018-12-14 DIAGNOSIS — Z7901 Long term (current) use of anticoagulants: Secondary | ICD-10-CM | POA: Insufficient documentation

## 2018-12-14 DIAGNOSIS — I4891 Unspecified atrial fibrillation: Secondary | ICD-10-CM | POA: Diagnosis not present

## 2018-12-14 DIAGNOSIS — E785 Hyperlipidemia, unspecified: Secondary | ICD-10-CM | POA: Insufficient documentation

## 2018-12-14 DIAGNOSIS — E039 Hypothyroidism, unspecified: Secondary | ICD-10-CM | POA: Diagnosis not present

## 2018-12-14 DIAGNOSIS — I080 Rheumatic disorders of both mitral and aortic valves: Secondary | ICD-10-CM | POA: Diagnosis not present

## 2018-12-14 DIAGNOSIS — Z6837 Body mass index (BMI) 37.0-37.9, adult: Secondary | ICD-10-CM | POA: Diagnosis not present

## 2018-12-14 DIAGNOSIS — I5022 Chronic systolic (congestive) heart failure: Secondary | ICD-10-CM | POA: Diagnosis not present

## 2018-12-14 DIAGNOSIS — Z7951 Long term (current) use of inhaled steroids: Secondary | ICD-10-CM | POA: Diagnosis not present

## 2018-12-14 DIAGNOSIS — Z79899 Other long term (current) drug therapy: Secondary | ICD-10-CM | POA: Diagnosis not present

## 2018-12-14 DIAGNOSIS — J45909 Unspecified asthma, uncomplicated: Secondary | ICD-10-CM | POA: Insufficient documentation

## 2018-12-14 DIAGNOSIS — I340 Nonrheumatic mitral (valve) insufficiency: Secondary | ICD-10-CM | POA: Diagnosis not present

## 2018-12-14 DIAGNOSIS — Z5309 Procedure and treatment not carried out because of other contraindication: Secondary | ICD-10-CM | POA: Diagnosis not present

## 2018-12-14 HISTORY — PX: TEE WITHOUT CARDIOVERSION: SHX5443

## 2018-12-14 HISTORY — PX: ATRIAL FIBRILLATION ABLATION: EP1191

## 2018-12-14 LAB — BASIC METABOLIC PANEL
Anion gap: 10 (ref 5–15)
BUN: 32 mg/dL — ABNORMAL HIGH (ref 8–23)
CO2: 25 mmol/L (ref 22–32)
Calcium: 8.9 mg/dL (ref 8.9–10.3)
Chloride: 103 mmol/L (ref 98–111)
Creatinine, Ser: 2.11 mg/dL — ABNORMAL HIGH (ref 0.61–1.24)
GFR calc Af Amer: 37 mL/min — ABNORMAL LOW (ref >60)
GFR calc non Af Amer: 32 mL/min — ABNORMAL LOW (ref >60)
Glucose, Bld: 93 mg/dL (ref 70–99)
Potassium: 4.1 mmol/L (ref 3.5–5.1)
Sodium: 138 mmol/L (ref 135–145)

## 2018-12-14 LAB — GLUCOSE, CAPILLARY
Glucose-Capillary: 90 mg/dL (ref 70–99)
Glucose-Capillary: 100 mg/dL — ABNORMAL HIGH (ref 70–99)

## 2018-12-14 SURGERY — ECHOCARDIOGRAM, TRANSESOPHAGEAL
Anesthesia: General

## 2018-12-14 MED ORDER — ROCURONIUM BROMIDE 50 MG/5ML IV SOSY
PREFILLED_SYRINGE | INTRAVENOUS | Status: DC | PRN
  Administered 2018-12-14: 60 mg via INTRAVENOUS

## 2018-12-14 MED ORDER — FENTANYL CITRATE (PF) 250 MCG/5ML IJ SOLN
INTRAMUSCULAR | Status: AC
  Filled 2018-12-14: qty 5

## 2018-12-14 MED ORDER — SUCCINYLCHOLINE CHLORIDE 200 MG/10ML IV SOSY
PREFILLED_SYRINGE | INTRAVENOUS | Status: DC | PRN
  Administered 2018-12-14: 120 mg via INTRAVENOUS

## 2018-12-14 MED ORDER — ONDANSETRON HCL 4 MG/2ML IJ SOLN
INTRAMUSCULAR | Status: DC | PRN
  Administered 2018-12-14: 4 mg via INTRAVENOUS

## 2018-12-14 MED ORDER — MIDAZOLAM HCL 5 MG/5ML IJ SOLN
INTRAMUSCULAR | Status: DC | PRN
  Administered 2018-12-14: 2 mg via INTRAVENOUS

## 2018-12-14 MED ORDER — SODIUM CHLORIDE 0.9 % IV SOLN
INTRAVENOUS | Status: DC
  Administered 2018-12-14: 06:00:00 via INTRAVENOUS

## 2018-12-14 MED ORDER — PROPOFOL 10 MG/ML IV BOLUS
INTRAVENOUS | Status: DC | PRN
  Administered 2018-12-14: 120 mg via INTRAVENOUS

## 2018-12-14 MED ORDER — MIDAZOLAM HCL 2 MG/2ML IJ SOLN
INTRAMUSCULAR | Status: AC
  Filled 2018-12-14: qty 2

## 2018-12-14 MED ORDER — FENTANYL CITRATE (PF) 250 MCG/5ML IJ SOLN
INTRAMUSCULAR | Status: DC | PRN
  Administered 2018-12-14: 100 ug via INTRAVENOUS

## 2018-12-14 MED ORDER — LIDOCAINE 2% (20 MG/ML) 5 ML SYRINGE
INTRAMUSCULAR | Status: DC | PRN
  Administered 2018-12-14: 70 mg via INTRAVENOUS

## 2018-12-14 MED ORDER — SUGAMMADEX SODIUM 200 MG/2ML IV SOLN
INTRAVENOUS | Status: DC | PRN
  Administered 2018-12-14: 250 mg via INTRAVENOUS

## 2018-12-14 MED ORDER — PHENYLEPHRINE HCL (PRESSORS) 10 MG/ML IV SOLN
INTRAVENOUS | Status: DC | PRN
  Administered 2018-12-14: 80 ug via INTRAVENOUS
  Administered 2018-12-14: 120 ug via INTRAVENOUS

## 2018-12-14 MED ORDER — ACETAMINOPHEN 500 MG PO TABS
1000.0000 mg | ORAL_TABLET | Freq: Once | ORAL | Status: AC
  Administered 2018-12-14: 1000 mg via ORAL
  Filled 2018-12-14: qty 2

## 2018-12-14 SURGICAL SUPPLY — 5 items
BLANKET WARM UNDERBOD FULL ACC (MISCELLANEOUS) ×1 IMPLANT
PACK EP LATEX FREE (CUSTOM PROCEDURE TRAY) ×2
PACK EP LF (CUSTOM PROCEDURE TRAY) ×1 IMPLANT
PAD PRO RADIOLUCENT 2001M-C (PAD) ×2 IMPLANT
PATCH CARTO3 (PAD) ×1 IMPLANT

## 2018-12-14 NOTE — Progress Notes (Signed)
D/c instructions given to patient and brother Aiven Kampe.  Al questions answered and Lavan verbalized understanding

## 2018-12-14 NOTE — Interval H&P Note (Signed)
History and Physical Interval Note:  12/14/2018 7:24 AM  [name]  has presented today for surgery, with the diagnosis of afib.  The various methods of treatment have been discussed with the patient and family. After consideration of risks, benefits and other options for treatment, the patient has consented to  Procedure(s): ATRIAL FIBRILLATION ABLATION (N/A) TRANSESOPHAGEAL ECHOCARDIOGRAM (TEE) (N/A) as a surgical intervention.  The patient's history has been reviewed, patient examined, no change in status, stable for surgery.  I have reviewed the patient's chart and labs.  Questions were answered to the patient's satisfaction.     The patient has symptomatic, recurrent persistent atrial fibrillation.  He has failed medical therapy with amiodarone and tikosyn he has developed worsening CHF due to his afib.  Unfortunately, he also has severe left atrial enlargement. Chads2vasc score is 2.  he is anticoagulated with xarelto . Risk, benefits, and alternatives to TEE as well as EP study and radiofrequency ablation for afib were also discussed in detail today. These risks include but are not limited to stroke, bleeding, vascular damage, tamponade, perforation, damage to the esophagus, lungs, and other structures, pulmonary vein stenosis, worsening renal function, and death. The patient understands these risk and wishes to proceed.     Thompson Grayer MD, [facility] 12/14/2018 7:25 AM

## 2018-12-14 NOTE — Progress Notes (Signed)
  Echocardiogram Echocardiogram Transesophageal has been performed.  [name] 12/14/2018, 8:27 AM

## 2018-12-14 NOTE — Anesthesia Procedure Notes (Signed)
Procedure Name: Intubation Date/Time: 12/14/2018 7:48 AM Performed by: Shirlyn Goltz, CRNA Pre-anesthesia Checklist: Patient identified, Emergency Drugs available, Suction available and Patient being monitored Patient Re-evaluated:Patient Re-evaluated prior to induction Oxygen Delivery Method: Circle system utilized Preoxygenation: Pre-oxygenation with 100% oxygen Induction Type: IV induction and Rapid sequence Laryngoscope Size: Mac and 4 Grade View: Grade I Tube type: Oral Tube size: 7.5 mm Number of attempts: 1 Airway Equipment and Method: Stylet Placement Confirmation: ETT inserted through vocal cords under direct vision,  positive ETCO2 and breath sounds checked- equal and bilateral Secured at: 23 cm Tube secured with: Tape Dental Injury: Teeth and Oropharynx as per pre-operative assessment

## 2018-12-14 NOTE — Transfer of Care (Signed)
Immediate Anesthesia Transfer of Care Note  Patient: [name]  Procedure(s) Performed: TRANSESOPHAGEAL ECHOCARDIOGRAM (TEE) (N/A )  Patient Location: Cath Lab  Anesthesia Type:General  Level of Consciousness: awake, alert , oriented and patient cooperative  Airway & Oxygen Therapy: Patient Spontanous Breathing and Patient connected to nasal cannula oxygen  Post-op Assessment: Report given to RN and Post -op Vital signs reviewed and stable  Post vital signs: Reviewed and stable  Last Vitals:  Vitals Value Taken Time  BP    Temp    Pulse    Resp    SpO2      Last Pain:  Vitals:   12/14/18 0601  TempSrc: Oral  PainSc: 0-No pain         Complications: No apparent anesthesia complications

## 2018-12-14 NOTE — Progress Notes (Signed)
Thrombus noted on TEE (Discussed and viewed with Dr Johnsie Cancel). Will cancel afib ablation.  He will need aggressive medical management of his cardiomyopathy.  ETOH avoidance.  Elevation in creatinine is worrisome. V rates are controlled (HR 50s).  I therefore do not feel that this is a tachycardia mediated CM.  We should consider amyloid, alcohol, or hypertension as causes.  Thompson Grayer MD, [place] 12/14/2018 8:09 AM

## 2018-12-14 NOTE — Telephone Encounter (Signed)
Per Dr. Jodelle Green Pt to see Dr. Harrell Gave for cardiomyopathy.

## 2018-12-14 NOTE — Anesthesia Postprocedure Evaluation (Signed)
Anesthesia Post Note  Patient: [name]  Procedure(s) Performed: TRANSESOPHAGEAL ECHOCARDIOGRAM (TEE) (N/A ) ATRIAL FIBRILLATION ABLATION (N/A )     Patient location during evaluation: Cath Lab Anesthesia Type: General Level of consciousness: awake and alert Pain management: pain level controlled Vital Signs Assessment: post-procedure vital signs reviewed and stable Respiratory status: spontaneous breathing, nonlabored ventilation, respiratory function stable and patient connected to nasal cannula oxygen Cardiovascular status: blood pressure returned to baseline and stable Postop Assessment: no apparent nausea or vomiting Anesthetic complications: no    Last Vitals:  Vitals:   12/14/18 0855 12/14/18 0918  BP: (!) 142/106 (!) 140/104  Pulse: (!) 55 63  Resp: 14 15  Temp: (!) 36.4 C   SpO2: 96% 92%    Last Pain:  Vitals:   12/14/18 0918  TempSrc:   PainSc: 0-No pain                 Ryan P Ellender

## 2018-12-14 NOTE — Progress Notes (Signed)
TEE: Done in EP lab with patient on propofol and intubated  Severe biatrial enlargement with marked smoke LAA pre thrombus and likely thrombus dense immobile sludge in LAA No ASD/PFO Mild to moderate MR restricted posterior leaflet motion AV slcerosis EF 30% low output diffuse hypokinesis Normal RV Moderate aortic atherosclerosis Mild TR  Ablation case will be cancelled due to risk of stroke Dr Rayann Heman present and reviewed images with him  Jenkins Rouge

## 2018-12-14 NOTE — Discharge Instructions (Signed)
Transesophageal Echocardiogram Transesophageal echocardiogram (TEE) is a test that uses sound waves to take pictures of your heart. TEE is done by passing a flexible tube down the esophagus. The esophagus is the tube that carries food from the throat to the stomach. The pictures give detailed images of your heart. This can help your doctor see if there are problems with your heart. What happens before the procedure? Staying hydrated Follow instructions from your doctor about hydration, which may include:  Up to 3 hours before the procedure - you may continue to drink clear liquids, such as: ? Water. ? Clear fruit juice. ? Black coffee. ? Plain tea.  Eating and drinking Follow instructions from your doctor about eating and drinking, which may include:  8 hours before the procedure - stop eating heavy meals or foods such as meat, fried foods, or fatty foods.  6 hours before the procedure - stop eating light meals or foods, such as toast or cereal.  6 hours before the procedure - stop drinking milk or drinks that contain milk.  3 hours before the procedure - stop drinking clear liquids. General instructions  You will need to take out any dentures or retainers.  Plan to have someone take you home from the hospital or clinic.  If you will be going home right after the procedure, plan to have someone with you for 24 hours.  Ask your doctor about: ? Changing or stopping your normal medicines. This is important if you take diabetes medicines or blood thinners. ? Taking over-the-counter medicines, vitamins, herbs, and supplements. ? Taking medicines such as aspirin and ibuprofen. These medicines can thin your blood. Do not take these medicines unless your doctor tells you to take them. What happens during the procedure?  To lower your risk of infection, your doctors will wash or clean their hands.  An IV will be put into one of your veins.  You will be given a medicine to help you  relax (sedative).  A medicine may be sprayed or gargled. This numbs the back of your throat.  Your blood pressure, heart rate, and breathing will be watched.  You may be asked to lay on your left side.  A bite block will be placed in your mouth. This keeps you from biting the tube.  The tip of the TEE probe will be placed into the back of your mouth.  You will be asked to swallow.  Your doctor will take pictures of your heart.  The probe and bite block will be taken out. The procedure may vary among doctors and hospitals. What happens after the procedure?   Your blood pressure, heart rate, breathing rate, and blood oxygen level will be watched until the medicines you were given have worn off.  When you first wake up, your throat may feel sore and numb. This will get better over time. You will not be allowed to eat or drink until the numbness has gone away.  Do not drive for 24 hours if you were given a medicine to help you relax. Summary  TEE is a test that uses sound waves to take pictures of your heart.  You will be given a medicine to help you relax.  Do not drive for 24 hours if you were given a medicine to help you relax. This information is not intended to replace advice given to you by your health care provider. Make sure you discuss any questions you have with your health care provider. Document Released:  03/30/2009 Document Revised: 02/19/2018 Document Reviewed: 09/03/2016 °Elsevier Patient Education © 2020 Elsevier Inc. ° °

## 2018-12-15 ENCOUNTER — Telehealth: Payer: Self-pay | Admitting: Pharmacist

## 2018-12-15 ENCOUNTER — Telehealth (HOSPITAL_COMMUNITY): Payer: Self-pay | Admitting: *Deleted

## 2018-12-15 MED ORDER — DABIGATRAN ETEXILATE MESYLATE 150 MG PO CAPS: 150.0000 mg | ORAL_CAPSULE | Freq: Two times a day (BID) | ORAL | 6 refills | Status: DC

## 2018-12-15 MED ORDER — VALSARTAN 320 MG PO TABS: 320.0000 mg | ORAL_TABLET | Freq: Every day | ORAL | 3 refills | Status: DC

## 2018-12-15 NOTE — Telephone Encounter (Signed)
-----   Message from Ramond Dial, [facility] sent at 12/15/2018  2:25 PM EDT ----- Can you get this guy in for an appointment. He also needs his losartan switched to valsartan. He was started on spironolactone 25mg  daily a few weeks ago. Scr has been creaping up. Went for cardioversion yesterday, found clot so didn't do. EF low. They think it might be BP releated instead of tachy now. Got BMP on admission and scr still increasing. Realized yesterday he is on losartan 100 BID. Ask Dr. Rayann Heman if we could use different HF ARB. He said to go ahead and switch to valsartan. See message below. If you can call patient and make change and then get him in clinic for follow up. Thanks! Sorry to leave you hanging!! ----- Message ----- From: Thompson Grayer, MD Sent: 12/14/2018   9:05 PM EDT To: Ramond Dial, [place]  Yes.  Go ahead and switch to valsartan, please. We may also need to reduce his lasix.  ----- Message ----- From: Ramond Dial, [facility] Sent: 12/14/2018   1:23 PM EDT To: Thompson Grayer, MD  Dr. Rayann Heman  This patient is on an inappropriate dose of losartan (100mg  BID). Could be contributing to his increased scr. Can we try switching to another HF ARB, such as valsartan or candesartan instead? We will recheck scr next week and have him see up is BP clinic.

## 2018-12-15 NOTE — Telephone Encounter (Signed)
Called pt and advised him to stop losartan 100mg  twice daily and start valsartan 320mg  daily. He sees Dr Harrell Gave next week and Roderic Palau at the end of the month - BP will be checked during each of these office visits. Will add a note to upcoming appt with Dr Harrell Gave to recheck BMET.

## 2018-12-15 NOTE — Telephone Encounter (Signed)
Per Dr. Rayann Heman recommend switching from xarelto to different agent since pt endorses no missed doses of xarelto prior to ablation. Pt prefers to switch to pradaxa - per Roderic Palau NP the dose should be pradaxa 150mg  twice a day. Pt understands to stop xarelto. Follow up for end of July since seeing Dr. Harrell Gave on 7/9. Pt verbalized understanding of recommendations.

## 2018-12-15 NOTE — Telephone Encounter (Signed)
-----   Message from Thompson Grayer, MD sent at 12/14/2018  8:12 AM EDT ----- LAA thrombus  AF ablation cancelled.  Please schedule AF clinic follow-up in 2 weeks

## 2018-12-16 ENCOUNTER — Encounter (HOSPITAL_BASED_OUTPATIENT_CLINIC_OR_DEPARTMENT_OTHER): Payer: BLUE CROSS/BLUE SHIELD | Attending: Internal Medicine

## 2018-12-16 DIAGNOSIS — L97212 Non-pressure chronic ulcer of right calf with fat layer exposed: Secondary | ICD-10-CM | POA: Insufficient documentation

## 2018-12-16 DIAGNOSIS — L97819 Non-pressure chronic ulcer of other part of right lower leg with unspecified severity: Secondary | ICD-10-CM | POA: Diagnosis not present

## 2018-12-16 DIAGNOSIS — I509 Heart failure, unspecified: Secondary | ICD-10-CM | POA: Insufficient documentation

## 2018-12-16 DIAGNOSIS — I87311 Chronic venous hypertension (idiopathic) with ulcer of right lower extremity: Secondary | ICD-10-CM | POA: Diagnosis not present

## 2018-12-16 DIAGNOSIS — E11622 Type 2 diabetes mellitus with other skin ulcer: Secondary | ICD-10-CM | POA: Diagnosis not present

## 2018-12-16 DIAGNOSIS — I110 Hypertensive heart disease with heart failure: Secondary | ICD-10-CM | POA: Diagnosis not present

## 2018-12-16 DIAGNOSIS — I87331 Chronic venous hypertension (idiopathic) with ulcer and inflammation of right lower extremity: Secondary | ICD-10-CM | POA: Diagnosis not present

## 2018-12-16 DIAGNOSIS — G4730 Sleep apnea, unspecified: Secondary | ICD-10-CM | POA: Insufficient documentation

## 2018-12-21 ENCOUNTER — Telehealth: Payer: Self-pay

## 2018-12-21 NOTE — Telephone Encounter (Signed)
   Pt updated with visitor's policy and to wear a mask. Pt voiced understanding.  COVID-19 Pre-Screening Questions:  . In the past 7 to 10 days have you had a cough,  shortness of breath, headache, congestion, fever (100 or greater) body aches, chills, sore throat, or sudden loss of taste or sense of smell? NO . Have you been around anyone with known Covid 19. NO . Have you been around anyone who is awaiting Covid 19 test results in the past 7 to 10 days? NO . Have you been around anyone who has been exposed to Covid 19, or has mentioned symptoms of Covid 19 within the past 7 to 10 days? NO  If you have any concerns/questions about symptoms patients report during screening (either on the phone or at threshold). Contact the provider seeing the patient or DOD for further guidance.  If neither are available contact a member of the leadership team.

## 2018-12-22 DIAGNOSIS — I110 Hypertensive heart disease with heart failure: Secondary | ICD-10-CM | POA: Diagnosis not present

## 2018-12-22 DIAGNOSIS — E11622 Type 2 diabetes mellitus with other skin ulcer: Secondary | ICD-10-CM | POA: Diagnosis not present

## 2018-12-22 DIAGNOSIS — I87331 Chronic venous hypertension (idiopathic) with ulcer and inflammation of right lower extremity: Secondary | ICD-10-CM | POA: Diagnosis not present

## 2018-12-22 DIAGNOSIS — G4730 Sleep apnea, unspecified: Secondary | ICD-10-CM | POA: Diagnosis not present

## 2018-12-22 DIAGNOSIS — L97212 Non-pressure chronic ulcer of right calf with fat layer exposed: Secondary | ICD-10-CM | POA: Diagnosis not present

## 2018-12-22 DIAGNOSIS — L97819 Non-pressure chronic ulcer of other part of right lower leg with unspecified severity: Secondary | ICD-10-CM | POA: Diagnosis not present

## 2018-12-22 DIAGNOSIS — I509 Heart failure, unspecified: Secondary | ICD-10-CM | POA: Diagnosis not present

## 2018-12-23 ENCOUNTER — Ambulatory Visit: Payer: BLUE CROSS/BLUE SHIELD | Admitting: Cardiology

## 2018-12-27 ENCOUNTER — Telehealth: Payer: Self-pay | Admitting: Cardiology

## 2018-12-27 NOTE — Telephone Encounter (Signed)
I called pt to confirm appt his appt with Dr Harrell Gave.        COVID-19 Pre-Screening Questions:   In the past 7 to 10 days have you had a cough,  shortness of breath, headache, congestion, fever (100 or greater) body aches, chills, sore throat, or sudden loss of taste or sense of smell? no  Have you been around anyone with known Covid 19.  Have you been around anyone who is awaiting Covid 19 test results in the past 7 to 10 days? no  Have you been around anyone who has been exposed to Covid 19, or has mentioned symptoms of Covid 19 within the past 7 to 10 days?  no  If you have any concerns/questions about symptoms patients report during screening (either on the phone or at threshold). Contact the provider seeing the patient or DOD for further guidance.  If neither are available contact a member of the leadership team.

## 2018-12-28 ENCOUNTER — Encounter: Payer: Self-pay | Admitting: Cardiology

## 2018-12-28 ENCOUNTER — Ambulatory Visit (INDEPENDENT_AMBULATORY_CARE_PROVIDER_SITE_OTHER): Payer: BLUE CROSS/BLUE SHIELD | Admitting: Cardiology

## 2018-12-28 ENCOUNTER — Other Ambulatory Visit: Payer: Self-pay

## 2018-12-28 VITALS — BP 130/88 | HR 64 | Ht 73.0 in | Wt 276.4 lb

## 2018-12-28 DIAGNOSIS — Z79899 Other long term (current) drug therapy: Secondary | ICD-10-CM

## 2018-12-28 DIAGNOSIS — I10 Essential (primary) hypertension: Secondary | ICD-10-CM

## 2018-12-28 DIAGNOSIS — I4811 Longstanding persistent atrial fibrillation: Secondary | ICD-10-CM

## 2018-12-28 DIAGNOSIS — I5022 Chronic systolic (congestive) heart failure: Secondary | ICD-10-CM | POA: Diagnosis not present

## 2018-12-28 DIAGNOSIS — I420 Dilated cardiomyopathy: Secondary | ICD-10-CM

## 2018-12-28 DIAGNOSIS — Z7189 Other specified counseling: Secondary | ICD-10-CM

## 2018-12-28 NOTE — Progress Notes (Signed)
Cardiology Office Note:    Date:  12/28/2018   ID:  [name], DOB 02/05/1956, MRN [id number]  PCP:  [name], MD  Cardiologist:  Buford Dresser, MD PhD  Referring MD: Thompson Grayer, MD   CC: new evaluation for cardiomyopathy  History of Present Illness:    [name] is a 63 y.o. male with a hx of persistent atrial fibrillation, atrial flutter with prior ablation in 2012, prior cardioversions, HTN, chronic systolic heart failure (thought to be tachycardia induced) with EF 30-35% on tte 04/2018, OSA who is seen as a new consult at the request of Thompson Grayer, MD for the evaluation and management of cardiomyopathy.  Cardiac history: 12/14/18 Was planned for afib ablation. However, TEE performed was concerning for thrombus. Also noted to have EF 30%, severe biatrial enlargement. 04/2018: TTE, EF 30-35%, diffuse hypokinesis, biatrial enlargement  Cardiovascular risk factors: Prior clinical ASCVD: no known ASCVD, no prior cath Comorbid conditions, including hypertension, hyperlipidemia, diabetes, chronic kidney disease: yes: HTN, DM, HLD. No formal CKD, but Cr 2.11, had been 0.3-0.0 Metabolic syndrome/Obesity: obesity Chronic inflammatory conditions: none Tobacco use history: never Alcohol: drinks beer, about 2-3 beers/day 2-3 times/week. Quit hard liquor, was drinking 2-3 glass/day, stopped 2-3 years ago. No illicits Family history: cousin with afib, no one he knows of with heart problems or heart failure Prior cardiac testing and/or incidental findings on other testing (ie coronary calcium): echo/TEE results Exercise level: works yard, [employer], plays golf. Feels more limited by afib than anything else (limits his exercise tolerance). Current diet: sausage biscuit and fruit in the morning, lunch varies, normal dinner is meat/fish.  Denies chest pain, shortness of breath at rest or with normal exertion. No PND, orthopnea, LE edema or unexpected weight  gain (though doesn't weight routinely). No syncope. Had presyncope out in the heat several years ago. Taking daily lasix, not as needed.Taking spironolactone 25 mg not 12.5 mg.   Past Medical History:  Diagnosis Date  . Allergy    SEASONAL  . Asthma   . Atrial fibrillation ([place])    persistant 02/2009  . Atrial flutter ([place])    s/p CTI ablation 08/06/10  . Cardiac arrhythmia due to congenital heart disease   . Cataract    BEGINING  . Chronic rhinitis   . Chronic systolic dysfunction of left ventricle   . Colon polyp   . Congestive heart failure ([place])   . Diverticulosis    colonoscopy 04/03/2009  . Hyperlipidemia   . Hypertension   . Hypertension   . Morbid obesity ([place])    target weight = 219  for BMI < 30  . Nonischemic cardiomyopathy ([facility])    tachycardia mediated  . Sleep apnea    original 2001    Past Surgical History:  Procedure Laterality Date  . ATRIAL FIBRILLATION ABLATION N/A 12/14/2018   Procedure: ATRIAL FIBRILLATION ABLATION;  Surgeon: Thompson Grayer, MD;  Location: [facility];  Service: Cardiovascular;  Laterality: N/A;  . atrial flutter ablation  08/06/10  . CARDIOVERSION N/A 07/05/2018   Procedure: CARDIOVERSION;  Surgeon: Buford Dresser, MD;  Location: [facility];  Service: Cardiovascular;  Laterality: N/A;  . COLONOSCOPY    . TEE WITHOUT CARDIOVERSION N/A 12/14/2018   Procedure: TRANSESOPHAGEAL ECHOCARDIOGRAM (TEE);  Surgeon: Thompson Grayer, MD;  Location: [facility];  Service: Cardiovascular;  Laterality: N/A;    Current Medications: Current Outpatient Medications on File Prior to Visit  Medication Sig  . albuterol (PROVENTIL HFA;VENTOLIN HFA)  108 (90 Base) MCG/ACT inhaler Inhale 2 puffs into the lungs every 6 (six) hours as needed for wheezing or shortness of breath.  [name] amiodarone (PACERONE) 200 MG tablet Take 1 tablet (200 mg total) by mouth daily.  . carvedilol (COREG) 25 MG tablet Take 0.5 tablets (12.5 mg total) by mouth 2 (two)  times daily with a meal.  . dabigatran (PRADAXA) 150 MG CAPS capsule Take 1 capsule (150 mg total) by mouth 2 (two) times daily.  . diphenhydramine-acetaminophen (TYLENOL PM) 25-500 MG TABS tablet Take 2 tablets by mouth at bedtime as needed (sleep).  [name] doxylamine, Sleep, (UNISOM) 25 MG tablet Take 50 mg by mouth at bedtime.  . furosemide (LASIX) 80 MG tablet TAKE 1/2-1 TABLET BY MOUTH DAILY AS NEEDED FOR FLUID OR EDEMA. (Patient taking differently: Take 80 mg by mouth daily. )  . gabapentin (NEURONTIN) 300 MG capsule TAKE 1 CAPSULE BY MOUTH THREE TIMES A DAY (Patient taking differently: Take 300 mg by mouth 2 (two) times daily. )  . glipiZIDE (GLUCOTROL) 5 MG tablet TAKE 1 TABLET BY MOUTH TWICE A DAY BEFORE MEALS/ NEED APPOINTMENT (Patient taking differently: Take 5 mg by mouth 2 (two) times a day. )  . KLOR-CON M20 20 MEQ tablet TAKE 1 TABLET BY MOUTH TWICE A DAY (Patient taking differently: Take 20 mEq by mouth 2 (two) times daily. )  . mometasone (NASONEX) 50 MCG/ACT nasal spray Place 2 sprays into the nose daily as needed (allergies or rhinitis).   . rosuvastatin (CRESTOR) 10 MG tablet TAKE 1 TABLET BY MOUTH EVERY DAY (Patient taking differently: Take 10 mg by mouth at bedtime. )  . sildenafil (VIAGRA) 100 MG tablet Take 1 tablet (100 mg total) by mouth as directed. (Patient taking differently: Take 100 mg by mouth as needed for erectile dysfunction. )  . spironolactone (ALDACTONE) 25 MG tablet Take 0.5 tablets (12.5 mg total) by mouth daily. (Patient taking differently: Take 25 mg by mouth daily. )  . SYMBICORT 80-4.5 MCG/ACT inhaler USE 2 INHALATIONS TWICE A DAY (Patient taking differently: Inhale 2 puffs into the lungs 2 (two) times daily as needed (shortness of breath). )  . traMADol (ULTRAM) 50 MG tablet Take 50 mg by mouth 2 (two) times daily as needed for severe pain.   . valsartan (DIOVAN) 320 MG tablet Take 1 tablet (320 mg total) by mouth daily.   No current facility-administered  medications on file prior to visit.      Allergies:   Patient has no known allergies.   Social History   Socioeconomic History  . Marital status: Single    Spouse name: Not on file  . Number of children: 0  . Years of education: Not on file  . Highest education level: Not on file  Occupational History  . Occupation: Press photographer  Social Needs  . Financial resource strain: Not on file  . Food insecurity    Worry: Not on file    Inability: Not on file  . Transportation needs    Medical: Not on file    Non-medical: Not on file  Tobacco Use  . Smoking status: Never Smoker  . Smokeless tobacco: Never Used  Substance and Sexual Activity  . Alcohol use: Yes    Alcohol/week: 0.0 standard drinks    Comment: previously quite heavy, working on cessation   . Drug use: No  . Sexual activity: Not on file  Lifestyle  . Physical activity    Days per week: Not on file  Minutes per session: Not on file  . Stress: Not on file  Relationships  . Social Herbalist on phone: Not on file    Gets together: Not on file    Attends religious service: Not on file    Active member of club or organization: Not on file    Attends meetings of clubs or organizations: Not on file    Relationship status: Not on file  Other Topics Concern  . Not on file  Social History Narrative   Lives in [place] and works for [name]     Family History: The patient's family history includes COPD in his father; Colon cancer in his brother; Colon cancer (age of onset: 49) in his father; Hypertension in his mother; Kidney failure in his mother. There is no history of Colon polyps, Diabetes, Kidney disease, or Esophageal cancer.  ROS:   Please see the history of present illness.  Additional pertinent ROS:  Constitutional: Negative for chills, fever, night sweats, unintentional weight loss  HENT: Negative for ear pain and hearing loss.   Eyes: Negative for loss of vision and eye pain.  Respiratory:  Negative for cough, sputum, shortness of breath, wheezing.   Cardiovascular: See HPI. Gastrointestinal: Negative for abdominal pain, melena, and hematochezia.  Genitourinary: Negative for dysuria and hematuria.  Musculoskeletal: Negative for falls and myalgias.  Skin: Negative for itching and rash.  Neurological: Negative for focal weakness, focal sensory changes and loss of consciousness.  Endo/Heme/Allergies: Does not bruise/bleed easily.    EKGs/Labs/Other Studies Reviewed:    The following studies were reviewed today: TTE 05/12/2018 Left ventricle: The cavity size was normal. There was severe   concentric hypertrophy. Systolic function was moderately to   severely reduced. The estimated ejection fraction was in the   range of 30% to 35%. Diffuse hypokinesis. The study was not   technically sufficient to allow evaluation of LV diastolic   dysfunction due to atrial fibrillation. - Aortic valve: There was no regurgitation. - Aortic root: The aortic root was normal in size. - Mitral valve: There was trivial regurgitation. - Left atrium: The atrium was severely dilated. - Right ventricle: Systolic function was normal. - Right atrium: The atrium was moderately dilated. - Tricuspid valve: There was trivial regurgitation. - Pulmonic valve: Transvalvular velocity was within the normal   range. There was no evidence for stenosis. - Pulmonary arteries: Systolic pressure was within the normal   range. - Inferior vena cava: The vessel was normal in size. The   respirophasic diameter changes were in the normal range (= 50%),   consistent with normal central venous pressure. - Pericardium, extracardiac: There was no pericardial effusion.  Impressions: - When compared to the prior study from 04/02/2017 LVEF has   decreased from 55-60% to 30-35% with diffuse hypokinesis.  TEE 12/14/18  1. The left ventricle has moderate-severely reduced systolic function, with an ejection fraction of  30-35%. The cavity size was moderately dilated. Left ventrical global hypokinesis without regional wall motion abnormalities.  2. The right ventricle has normal systolc function. The cavity was normal. There is no increase in right ventricular wall thickness.  3. Left atrial size was severely dilated.  4. Dense smoke in atria and appendage. Stagnant pre thrombus and likely thrombus in mid to distal LAA High risk for embolic event EP procedure cancelled.  5. Right atrial size was moderately dilated.  6. The mitral valve is grossly normal. Mild thickening of the mitral valve leaflet. Mild calcification  of the mitral valve leaflet. Mitral valve regurgitation is mild to moderate by color flow Doppler.  7. Restricted posterior leaflet motion.  8. The aortic valve is tricuspid Moderate thickening of the aortic valve Sclerosis without any evidence of stenosis of the aortic valve. Aortic valve regurgitation is trivial by color flow Doppler.  9. The aortic root is normal in size and structure.  EKG:  EKG is personally reviewed.  The ekg ordered 12/15/18 demonstrates afib, RBBB, LAFB  Recent Labs: 04/07/2018: BNP 367.1 05/03/2018: ALT 16; TSH 4.800 11/24/2018: Hemoglobin 16.4; Magnesium 1.9; Platelets 196 12/14/2018: BUN 32; Creatinine, Ser 2.11; Potassium 4.1; Sodium 138  Recent Lipid Panel    Component Value Date/Time   CHOL 148 11/26/2017 0829   TRIG 92.0 11/26/2017 0829   HDL 60.60 11/26/2017 0829   CHOLHDL 2 11/26/2017 0829   VLDL 18.4 11/26/2017 0829   LDLCALC 69 11/26/2017 0829   LDLDIRECT 81.0 08/31/2015 0905    Physical Exam:    VS:  BP 130/88   Pulse 64   Ht 6\' 1"  (1.854 m)   Wt 276 lb 6.4 oz (125.4 kg)   SpO2 98%   BMI 36.47 kg/m     Wt Readings from Last 3 Encounters:  01/11/19 285 lb (129.3 kg)  12/28/18 276 lb 6.4 oz (125.4 kg)  12/14/18 275 lb (124.7 kg)     GEN: Well nourished, well developed in no acute distress HEENT: Normal NECK: No JVD; No carotid bruits  LYMPHATICS: No lymphadenopathy CARDIAC: irregular rhythm, normal S1 and S2, no murmurs, rubs, gallops. Radial and DP pulses 2+ bilaterally. RESPIRATORY:  Clear to auscultation without rales, wheezing or rhonchi  ABDOMEN: Soft, non-tender, non-distended MUSCULOSKELETAL:  No edema; No deformity  SKIN: Warm and dry NEUROLOGIC:  Alert and oriented x 3 PSYCHIATRIC:  Normal affect   ASSESSMENT:    1. Dilated cardiomyopathy ([place])   2. Medication management   3. Chronic systolic heart failure ([place])   4. Longstanding persistent atrial fibrillation   5. Cardiac risk counseling   6. Counseling on health promotion and disease prevention   7. Essential hypertension    PLAN:    Cardiomyopathy, unknown etiology, with chronic systolic and diastolic heart failure: initially suspected to be due to atrial arrhythmias. Hence, he has been attempted to maintain sinus rhythm with amiodarone/cardioversion. Was planned for ablation 12/14/18 but TEE concerning for thrombus. -discussed workup for cardiomyopathy. First step is to determine ischemic vs. Nonischemic. Discussed risk and benefit to right and left heart cath with coronary angiography. He is amenable to proceed. Will schedule with Dr. Haroldine Laws  Risks and benefits of cardiac catheterization have been discussed with the patient.  These include bleeding, infection, kidney damage, stroke, heart attack, death.  The patient understands these risks and is willing to proceed.  If nonischemic, will then need to further pursue etiology.  Currently on carvedilol 25 mg BID, valsartan 320 mg daily. If nonischemic based on cath, would change over to entresto.  Counseled on daily weights, salt avoidance. Continue PRN furosemide.  History of atrial fibrillation and atrial flutter: S/p CTI ablation 2012 Has OSA, encouraged adherence to CPAP Continue amiodarone Continue dabigatran, will need to be held prior to cath Ablation aborted due to thrombus in LAA.    Hypertension: Goal <130/80, near goal today -continue carvedilol, valsartan, PRN lasix -change to entresto post cath  Cardiac risk counseling and prevention recommendations: -recommend heart healthy/Mediterranean diet, with whole grains, fruits, vegetable, fish, lean meats, nuts, and olive oil. Limit salt. -  recommend moderate walking, 3-5 times/week for 30-50 minutes each session. Aim for at least 150 minutes.week. Goal should be pace of 3 miles/hours, or walking 1.5 miles in 30 minutes -recommend avoidance of tobacco products. Avoid excess alcohol. -Additional risk factor control:  -Diabetes: A1c is 6.1. Consider SGLT2i instead of glipizide after cath  -Lipids: Tchol 148, HDL 60, LDL 69, TG 92. On rosuvastatin 10 mg  -Blood pressure control: as above  -Weight: would benefit from weight loss, BMI 36 -ASCVD risk score: The 10-year ASCVD risk score Mikey Bussing DC Brooke Bonito., et al., 2013) is: 19.1%   Values used to calculate the score:     Age: 63 years     Sex: Male     Is Non-Hispanic African American: No     Diabetic: Yes     Tobacco smoker: No     Systolic Blood Pressure: 476 mmHg     Is BP treated: Yes     HDL Cholesterol: 60.6 mg/dL     Total Cholesterol: 148 mg/dL    Plan for follow up: 4-6 weeks  ADDED AFTER VISIT: PATIENT'S PRE CATH COVID TEST CAME BACK POSITIVE. PATIENT CONTACTED WITH RESULTS. PLEASE SEE PHONE NOTE FOR DISCUSSION. CATH CANCELLED [place].  Medication Adjustments/Labs and Tests Ordered: Current medicines are reviewed at length with the patient today.  Concerns regarding medicines are outlined above.  Orders Placed This Encounter  Procedures  . Basic metabolic panel   No orders of the defined types were placed in this encounter.   Patient Instructions  Medication Instructions:  Your Physician recommend you continue on your current medication as directed.    If you need a refill on your cardiac medications before your next appointment,  please call your pharmacy.   Lab work: Your physician recommends that you return for lab work today (BMP)  If you have labs (blood work) drawn today and your tests are completely normal, you will receive your results only by: [name] MyChart Message (if you have MyChart) OR . A paper copy in the mail If you have any lab test that is abnormal or we need to change your treatment, we will call you to review the results.  Testing/Procedures: None  Follow-Up: At [facility], you and your health needs are our priority.  As part of our continuing mission to provide you with exceptional heart care, we have created designated Provider Care Teams.  These Care Teams include your primary Cardiologist (physician) and Advanced Practice Providers (APPs -  Physician Assistants and Nurse Practitioners) who all work together to provide you with the care you need, when you need it. You will need a follow up appointment in 4-6 weeks.  Please call our office 2 months in advance to schedule this appointment.  You may see Buford Dresser, MD or one of the following Advanced Practice Providers on your designated Care Team:   Rosaria Ferries, PA-C . Jory Sims, DNP, ANP        Signed, Buford Dresser, MD PhD 12/28/2018  [place]

## 2018-12-28 NOTE — Patient Instructions (Signed)
Medication Instructions:  Your Physician recommend you continue on your current medication as directed.    If you need a refill on your cardiac medications before your next appointment, please call your pharmacy.   Lab work: Your physician recommends that you return for lab work today (BMP)  If you have labs (blood work) drawn today and your tests are completely normal, you will receive your results only by: [name] MyChart Message (if you have MyChart) OR . A paper copy in the mail If you have any lab test that is abnormal or we need to change your treatment, we will call you to review the results.  Testing/Procedures: None  Follow-Up: At [facility], you and your health needs are our priority.  As part of our continuing mission to provide you with exceptional heart care, we have created designated Provider Care Teams.  These Care Teams include your primary Cardiologist (physician) and Advanced Practice Providers (APPs -  Physician Assistants and Nurse Practitioners) who all work together to provide you with the care you need, when you need it. You will need a follow up appointment in 4-6 weeks.  Please call our office 2 months in advance to schedule this appointment.  You may see Buford Dresser, MD or one of the following Advanced Practice Providers on your designated Care Team:   Rosaria Ferries, PA-C . Jory Sims, DNP, ANP

## 2018-12-29 LAB — BASIC METABOLIC PANEL
BUN/Creatinine Ratio: 16 (ref 10–24)
BUN: 29 mg/dL — ABNORMAL HIGH (ref 8–27)
CO2: 21 mmol/L (ref 20–29)
Calcium: 9.3 mg/dL (ref 8.6–10.2)
Chloride: 101 mmol/L (ref 96–106)
Creatinine, Ser: 1.78 mg/dL — ABNORMAL HIGH (ref 0.76–1.27)
GFR calc Af Amer: 46 mL/min/{1.73_m2} — ABNORMAL LOW (ref >59)
GFR calc non Af Amer: 40 mL/min/{1.73_m2} — ABNORMAL LOW (ref >59)
Glucose: 88 mg/dL (ref 65–99)
Potassium: 5.2 mmol/L (ref 3.5–5.2)
Sodium: 142 mmol/L (ref 134–144)

## 2018-12-30 DIAGNOSIS — G4730 Sleep apnea, unspecified: Secondary | ICD-10-CM | POA: Diagnosis not present

## 2018-12-30 DIAGNOSIS — L97819 Non-pressure chronic ulcer of other part of right lower leg with unspecified severity: Secondary | ICD-10-CM | POA: Diagnosis not present

## 2018-12-30 DIAGNOSIS — I87311 Chronic venous hypertension (idiopathic) with ulcer of right lower extremity: Secondary | ICD-10-CM | POA: Diagnosis not present

## 2018-12-30 DIAGNOSIS — L97212 Non-pressure chronic ulcer of right calf with fat layer exposed: Secondary | ICD-10-CM | POA: Diagnosis not present

## 2018-12-30 DIAGNOSIS — E11622 Type 2 diabetes mellitus with other skin ulcer: Secondary | ICD-10-CM | POA: Diagnosis not present

## 2018-12-30 DIAGNOSIS — I509 Heart failure, unspecified: Secondary | ICD-10-CM | POA: Diagnosis not present

## 2018-12-30 DIAGNOSIS — I87331 Chronic venous hypertension (idiopathic) with ulcer and inflammation of right lower extremity: Secondary | ICD-10-CM | POA: Diagnosis not present

## 2018-12-30 DIAGNOSIS — I110 Hypertensive heart disease with heart failure: Secondary | ICD-10-CM | POA: Diagnosis not present

## 2019-01-05 ENCOUNTER — Other Ambulatory Visit: Payer: Self-pay

## 2019-01-05 ENCOUNTER — Telehealth: Payer: Self-pay

## 2019-01-05 DIAGNOSIS — Z01812 Encounter for preprocedural laboratory examination: Secondary | ICD-10-CM

## 2019-01-05 NOTE — Telephone Encounter (Signed)
Spoke with pt and went over Cath instructions/date. Pt also made aware the COVID test is scheduled for 01/15/19 and he will be required to quarantine up to the day of procedure. Cath instructions also placed in the mail for pt to review.

## 2019-01-06 DIAGNOSIS — I87311 Chronic venous hypertension (idiopathic) with ulcer of right lower extremity: Secondary | ICD-10-CM | POA: Diagnosis not present

## 2019-01-06 DIAGNOSIS — L97819 Non-pressure chronic ulcer of other part of right lower leg with unspecified severity: Secondary | ICD-10-CM | POA: Diagnosis not present

## 2019-01-06 DIAGNOSIS — L97212 Non-pressure chronic ulcer of right calf with fat layer exposed: Secondary | ICD-10-CM | POA: Diagnosis not present

## 2019-01-06 DIAGNOSIS — I509 Heart failure, unspecified: Secondary | ICD-10-CM | POA: Diagnosis not present

## 2019-01-06 DIAGNOSIS — I87331 Chronic venous hypertension (idiopathic) with ulcer and inflammation of right lower extremity: Secondary | ICD-10-CM | POA: Diagnosis not present

## 2019-01-06 DIAGNOSIS — G4730 Sleep apnea, unspecified: Secondary | ICD-10-CM | POA: Diagnosis not present

## 2019-01-06 DIAGNOSIS — I110 Hypertensive heart disease with heart failure: Secondary | ICD-10-CM | POA: Diagnosis not present

## 2019-01-06 DIAGNOSIS — E11622 Type 2 diabetes mellitus with other skin ulcer: Secondary | ICD-10-CM | POA: Diagnosis not present

## 2019-01-09 ENCOUNTER — Other Ambulatory Visit: Payer: Self-pay | Admitting: Physician Assistant

## 2019-01-11 ENCOUNTER — Other Ambulatory Visit: Payer: Self-pay

## 2019-01-11 ENCOUNTER — Encounter (HOSPITAL_COMMUNITY): Payer: Self-pay | Admitting: Nurse Practitioner

## 2019-01-11 ENCOUNTER — Ambulatory Visit (HOSPITAL_COMMUNITY)
Admission: RE | Admit: 2019-01-11 | Discharge: 2019-01-11 | Disposition: A | Discharge status: Home / Self Care | Payer: BLUE CROSS/BLUE SHIELD | Source: Ambulatory Visit | Attending: Physician Assistant | Admitting: Physician Assistant

## 2019-01-11 VITALS — BP 112/82 | HR 48 | Ht 73.0 in | Wt 285.0 lb

## 2019-01-11 DIAGNOSIS — Z6837 Body mass index (BMI) 37.0-37.9, adult: Secondary | ICD-10-CM | POA: Insufficient documentation

## 2019-01-11 DIAGNOSIS — Z825 Family history of asthma and other chronic lower respiratory diseases: Secondary | ICD-10-CM | POA: Insufficient documentation

## 2019-01-11 DIAGNOSIS — I4819 Other persistent atrial fibrillation: Secondary | ICD-10-CM | POA: Insufficient documentation

## 2019-01-11 DIAGNOSIS — E785 Hyperlipidemia, unspecified: Secondary | ICD-10-CM | POA: Diagnosis not present

## 2019-01-11 DIAGNOSIS — Z8249 Family history of ischemic heart disease and other diseases of the circulatory system: Secondary | ICD-10-CM | POA: Diagnosis not present

## 2019-01-11 DIAGNOSIS — Z79899 Other long term (current) drug therapy: Secondary | ICD-10-CM | POA: Insufficient documentation

## 2019-01-11 DIAGNOSIS — I110 Hypertensive heart disease with heart failure: Secondary | ICD-10-CM | POA: Diagnosis not present

## 2019-01-11 DIAGNOSIS — G4730 Sleep apnea, unspecified: Secondary | ICD-10-CM | POA: Diagnosis not present

## 2019-01-11 DIAGNOSIS — Z7901 Long term (current) use of anticoagulants: Secondary | ICD-10-CM | POA: Diagnosis not present

## 2019-01-11 DIAGNOSIS — Z841 Family history of disorders of kidney and ureter: Secondary | ICD-10-CM | POA: Diagnosis not present

## 2019-01-11 DIAGNOSIS — I509 Heart failure, unspecified: Secondary | ICD-10-CM | POA: Diagnosis not present

## 2019-01-11 DIAGNOSIS — Z7984 Long term (current) use of oral hypoglycemic drugs: Secondary | ICD-10-CM | POA: Insufficient documentation

## 2019-01-11 LAB — CBC
HCT: 57.7 % — ABNORMAL HIGH (ref 39.0–52.0)
Hemoglobin: 18.5 g/dL — ABNORMAL HIGH (ref 13.0–17.0)
MCH: 28.8 pg (ref 26.0–34.0)
MCHC: 32.1 g/dL (ref 30.0–36.0)
MCV: 89.9 fL (ref 80.0–100.0)
Platelets: 147 10*3/uL — ABNORMAL LOW (ref 150–400)
RBC: 6.42 MIL/uL — ABNORMAL HIGH (ref 4.22–5.81)
RDW: 17 % — ABNORMAL HIGH (ref 11.5–15.5)
WBC: 5.3 10*3/uL (ref 4.0–10.5)
nRBC: 0 % (ref 0.0–0.2)

## 2019-01-11 LAB — BASIC METABOLIC PANEL
Anion gap: 9 (ref 5–15)
BUN: 29 mg/dL — ABNORMAL HIGH (ref 8–23)
CO2: 27 mmol/L (ref 22–32)
Calcium: 9.2 mg/dL (ref 8.9–10.3)
Chloride: 98 mmol/L (ref 98–111)
Creatinine, Ser: 1.77 mg/dL — ABNORMAL HIGH (ref 0.61–1.24)
GFR calc Af Amer: 46 mL/min — ABNORMAL LOW (ref >60)
GFR calc non Af Amer: 40 mL/min — ABNORMAL LOW (ref >60)
Glucose, Bld: 116 mg/dL — ABNORMAL HIGH (ref 70–99)
Potassium: 5.3 mmol/L — ABNORMAL HIGH (ref 3.5–5.1)
Sodium: 134 mmol/L — ABNORMAL LOW (ref 135–145)

## 2019-01-11 NOTE — Progress Notes (Signed)
Primary Care Physician: [name] Koch, MD Referring Physician:Dr. Rayann [name]   [name] is a 63 y.o. male with a h/o persistent afib, s/p ablation 2012, previous cardioversions, HTN, CHF,that is in the afib clinic to f/u amiodarone load. On last visit, he had a successful cardioversion. He can not tell when he is in or out of rhythm. However, he had sinus brady at 47 bpm, with first degree AV block as well as bifascicular block and I decreased amiodarone to 200 mg daily and reduced carvedilol.  The last time I saw pt was  07/27/18, and found him to be in atrial flutter with variable block, controlled v rates at 63 bpm.  He was unaware. He was referred back to Dr. Rayann [name] to discuss ablation but the appointment was delayed due to covid. He then developed a venous ulcer of rt lower leg and this required many visits to address and  is still being treated. He was finally scheduled for ablation and went in for TEE where a left atrial thrombus   was found, also EF of 30% and severe left atrial enlargement. Dr. Rayann [name] cancelled ablation and changed his Xarelto to Pradaxa 150 mg bid. He was asked to f/u here for that change. In the  interim, pt saw Dr. Harrell [name] to further evaluate cardiomyopathy as Dr. Rayann [name] did not feel this was [name] as his v rates have been controlled. He is now scheduled for a rt/left heart cath 8/5.   He is tolerating pradxa well. Oddly enough, he is  in Sinus brady today at 48 bpm. He does continue on amiodarone 200 mg qd.  Today, he denies symptoms of palpitations, chest pain, shortness of breath, orthopnea, PND, lower extremity edema, dizziness, presyncope, syncope, or neurologic sequela. The patient is tolerating medications without difficulties and is otherwise without complaint today.   Past Medical History:  Diagnosis Date  . Allergy    SEASONAL  . Asthma   . Atrial fibrillation ([place])    persistant 02/2009  . Atrial flutter ([place])    s/p CTI ablation 08/06/10  .  Cardiac arrhythmia due to congenital heart disease   . Cataract    BEGINING  . Chronic rhinitis   . Chronic systolic dysfunction of left ventricle   . Colon polyp   . Congestive heart failure ([place])   . Diverticulosis    colonoscopy 04/03/2009  . Hyperlipidemia   . Hypertension   . Hypertension   . Morbid obesity ([place])    target weight = 219  for BMI < 30  . Nonischemic cardiomyopathy ([facility])    tachycardia mediated  . Sleep apnea    original 2001   Past Surgical History:  Procedure Laterality Date  . ATRIAL FIBRILLATION ABLATION N/A 12/14/2018   Procedure: ATRIAL FIBRILLATION ABLATION;  Surgeon: [name] Grayer, MD;  Location: [facility];  Service: Cardiovascular;  Laterality: N/A;  . atrial flutter ablation  08/06/10  . CARDIOVERSION N/A 07/05/2018   Procedure: CARDIOVERSION;  Surgeon: [name] Dresser, MD;  Location: [facility] ENDOSCOPY;  Service: Cardiovascular;  Laterality: N/A;  . COLONOSCOPY    . TEE WITHOUT CARDIOVERSION N/A 12/14/2018   Procedure: TRANSESOPHAGEAL ECHOCARDIOGRAM (TEE);  Surgeon: [name] Grayer, MD;  Location: [facility];  Service: Cardiovascular;  Laterality: N/A;    Current Outpatient Medications  Medication Sig Dispense Refill  . albuterol (PROVENTIL HFA;VENTOLIN HFA) 108 (90 Base) MCG/ACT inhaler Inhale 2 puffs into the lungs every 6 (six) hours as needed for wheezing or shortness of  breath.    [name] amiodarone (PACERONE) 200 MG tablet Take 1 tablet (200 mg total) by mouth daily. 180 tablet 3  . carvedilol (COREG) 25 MG tablet Take 0.5 tablets (12.5 mg total) by mouth 2 (two) times daily with a meal. 180 tablet 3  . dabigatran (PRADAXA) 150 MG CAPS capsule Take 1 capsule (150 mg total) by mouth 2 (two) times daily. 60 capsule 6  . diphenhydramine-acetaminophen (TYLENOL PM) 25-500 MG TABS tablet Take 2 tablets by mouth at bedtime as needed (sleep).    [name] doxylamine, Sleep, (UNISOM) 25 MG tablet Take 50 mg by mouth at bedtime.    . furosemide (LASIX)  80 MG tablet TAKE 1/2-1 TABLET BY MOUTH DAILY AS NEEDED FOR FLUID OR EDEMA. (Patient taking differently: Take 80 mg by mouth daily. ) 90 tablet 0  . gabapentin (NEURONTIN) 300 MG capsule TAKE 1 CAPSULE BY MOUTH THREE TIMES A DAY (Patient taking differently: Take 300 mg by mouth 2 (two) times daily. ) 270 capsule 2  . glipiZIDE (GLUCOTROL) 5 MG tablet TAKE 1 TABLET BY MOUTH TWICE A DAY BEFORE MEALS/ NEED APPOINTMENT (Patient taking differently: Take 5 mg by mouth 2 (two) times a day. ) 60 tablet 0  . KLOR-CON M20 20 MEQ tablet TAKE 1 TABLET BY MOUTH TWICE A DAY (Patient taking differently: Take 20 mEq by mouth 2 (two) times daily. ) 180 tablet 3  . mometasone (NASONEX) 50 MCG/ACT nasal spray Place 2 sprays into the nose daily as needed (allergies or rhinitis).     . rosuvastatin (CRESTOR) 10 MG tablet TAKE 1 TABLET BY MOUTH EVERY DAY (Patient taking differently: Take 10 mg by mouth at bedtime. ) 90 tablet 0  . sildenafil (VIAGRA) 100 MG tablet Take 1 tablet (100 mg total) by mouth as directed. (Patient taking differently: Take 100 mg by mouth as needed for erectile dysfunction. ) 15 tablet 3  . spironolactone (ALDACTONE) 25 MG tablet Take 25 mg by mouth daily.    . SYMBICORT 80-4.5 MCG/ACT inhaler USE 2 INHALATIONS TWICE A DAY (Patient taking differently: Inhale 2 puffs into the lungs 2 (two) times daily as needed (shortness of breath). ) 30.6 g 1  . valsartan (DIOVAN) 320 MG tablet Take 1 tablet (320 mg total) by mouth daily. 90 tablet 3   No current facility-administered medications for this encounter.     No Known Allergies  Social History   Socioeconomic History  . Marital status: Single    Spouse name: Not on file  . Number of children: 0  . Years of education: Not on file  . Highest education level: Not on file  Occupational History  . Occupation: Press photographer  Social Needs  . Financial resource strain: Not on file  . Food insecurity    Worry: Not on file    Inability: Not on file  .  Transportation needs    Medical: Not on file    Non-medical: Not on file  Tobacco Use  . Smoking status: Never Smoker  . Smokeless tobacco: Never Used  Substance and Sexual Activity  . Alcohol use: Yes    Alcohol/week: 0.0 standard drinks    Comment: previously quite heavy, working on cessation   . Drug use: No  . Sexual activity: Not on file  Lifestyle  . Physical activity    Days per week: Not on file    Minutes per session: Not on file  . Stress: Not on file  Relationships  . Social connections  Talks on phone: Not on file    Gets together: Not on file    Attends religious service: Not on file    Active member of club or organization: Not on file    Attends meetings of clubs or organizations: Not on file    Relationship status: Not on file  . Intimate partner violence    Fear of current or ex partner: Not on file    Emotionally abused: Not on file    Physically abused: Not on file    Forced sexual activity: Not on file  Other Topics Concern  . Not on file  Social History Narrative   Lives in [place] and works for [name]    Family History  Problem Relation Age of Onset  . Hypertension Mother   . Kidney failure Mother        Due to sepsis  . Colon cancer Father 37  . COPD Father   . Colon cancer Brother   . Colon polyps Neg Hx   . Diabetes Neg Hx   . Kidney disease Neg Hx   . Esophageal cancer Neg Hx     ROS- All systems are reviewed and negative except as per the HPI above  Physical Exam: Vitals:   01/11/19 1324  BP: 112/82  Pulse: (!) 48  Weight: 129.3 kg  Height: 6\' 1"  (1.854 m)   Wt Readings from Last 3 Encounters:  01/11/19 129.3 kg  12/28/18 125.4 kg  12/14/18 124.7 kg    Labs: Lab Results  Component Value Date   NA 134 (L) 01/11/2019   K 5.3 (H) 01/11/2019   CL 98 01/11/2019   CO2 27 01/11/2019   GLUCOSE 116 (H) 01/11/2019   BUN 29 (H) 01/11/2019   CREATININE 1.77 (H) 01/11/2019   CALCIUM 9.2 01/11/2019   MG 1.9 11/24/2018    Lab Results  Component Value Date   INR 1.4 02/06/2010   Lab Results  Component Value Date   CHOL 148 11/26/2017   HDL 60.60 11/26/2017   LDLCALC 69 11/26/2017   TRIG 92.0 11/26/2017     GEN- The patient is well appearing, alert and oriented x 3 today.   Head- normocephalic, atraumatic Eyes-  Sclera clear, conjunctiva pink Ears- hearing intact Oropharynx- clear Neck- supple, no JVP Lymph- no cervical lymphadenopathy Lungs- Clear to ausculation bilaterally, normal work of breathing Heart- regular rate and rhythm, no murmurs, rubs or gallops, PMI not laterally displaced GI- soft, NT, ND, + BS Extremities- no clubbing, cyanosis, or edema MS- no significant deformity or atrophy Skin- no rash or lesion Psych- euthymic mood, full affect Neuro- strength and sensation are intact  EKG- sinus brady with first degree AV block  at 48 bpm, LAD, pr int 238, qrs int 144 ms, qtc 477 ms     Assessment and Plan: 1. Persistent afib Successful cardioversion but ERAF 07/2018 Strangely enough he is in Sinus brady today, unaware Pt was pending an ablation but thrombus/cardiomyopathy was found on TEE Now it is on hold and pt is being further worked up for cardiomyopathy by Dr. Harrell [name]  He has been changed from [place] to  Pradaxa and is tolerating/compliant   Rt/left heart cath pending with Dr. Haroldine Laws 8/5 I will go ahead and draw CBC/bmet for this procedure to save him a trip back to [place] He is aware of covid test  date and instructions of holding Pradaxa prior to cath previously given to him by Dr. Harrell [name]  F/u per plan  with [name] Long, DNP and Dr. Rayann [name] 9/28  [name]. [place], [facility] [address] [place], [place] [address] [phone]

## 2019-01-13 DIAGNOSIS — I87331 Chronic venous hypertension (idiopathic) with ulcer and inflammation of right lower extremity: Secondary | ICD-10-CM | POA: Diagnosis not present

## 2019-01-13 DIAGNOSIS — S81801A Unspecified open wound, right lower leg, initial encounter: Secondary | ICD-10-CM | POA: Diagnosis not present

## 2019-01-13 DIAGNOSIS — I110 Hypertensive heart disease with heart failure: Secondary | ICD-10-CM | POA: Diagnosis not present

## 2019-01-13 DIAGNOSIS — G4730 Sleep apnea, unspecified: Secondary | ICD-10-CM | POA: Diagnosis not present

## 2019-01-13 DIAGNOSIS — L97819 Non-pressure chronic ulcer of other part of right lower leg with unspecified severity: Secondary | ICD-10-CM | POA: Diagnosis not present

## 2019-01-13 DIAGNOSIS — L97212 Non-pressure chronic ulcer of right calf with fat layer exposed: Secondary | ICD-10-CM | POA: Diagnosis not present

## 2019-01-13 DIAGNOSIS — E11622 Type 2 diabetes mellitus with other skin ulcer: Secondary | ICD-10-CM | POA: Diagnosis not present

## 2019-01-13 DIAGNOSIS — I87311 Chronic venous hypertension (idiopathic) with ulcer of right lower extremity: Secondary | ICD-10-CM | POA: Diagnosis not present

## 2019-01-13 DIAGNOSIS — I509 Heart failure, unspecified: Secondary | ICD-10-CM | POA: Diagnosis not present

## 2019-01-14 ENCOUNTER — Telehealth: Payer: Self-pay | Admitting: Cardiology

## 2019-01-14 NOTE — Telephone Encounter (Signed)
New message   Patient is returning call about lab work results. Please call.

## 2019-01-14 NOTE — Telephone Encounter (Signed)
Advised patient of lab results,  to make sure to stay hydrated, and decrease potassium intake.    Notes recorded by Meryl Crutch, RN on 01/14/2019 at 9:11 AM EDT  Left message to call back.   Notes recorded by Buford Dresser, MD on 01/12/2019 at 1:31 PM EDT  Labs stable for cath. He should make sure he says hydrated prior to his cath up until he is NPO. Thanks.   Notes recorded by Sherran Needs, NP on 01/12/2019 at 8:44 AM EDT  Forwarding labs to Dr. Harrell Gave. Labs were drawn as pt courtesy, pre cath labs.

## 2019-01-15 ENCOUNTER — Other Ambulatory Visit (HOSPITAL_COMMUNITY)
Admission: RE | Admit: 2019-01-15 | Discharge: 2019-01-15 | Disposition: A | Discharge status: Home / Self Care | Payer: BLUE CROSS/BLUE SHIELD | Source: Ambulatory Visit | Attending: Internal Medicine | Admitting: Internal Medicine

## 2019-01-15 DIAGNOSIS — U071 COVID-19: Secondary | ICD-10-CM | POA: Diagnosis not present

## 2019-01-15 DIAGNOSIS — Z01812 Encounter for preprocedural laboratory examination: Secondary | ICD-10-CM | POA: Diagnosis not present

## 2019-01-15 LAB — SARS CORONAVIRUS 2 (TAT 6-24 HRS): SARS Coronavirus 2: POSITIVE — AB

## 2019-01-17 ENCOUNTER — Telehealth: Payer: Self-pay | Admitting: Cardiology

## 2019-01-17 ENCOUNTER — Telehealth: Payer: Self-pay

## 2019-01-17 NOTE — Progress Notes (Signed)
Dr Harrell Gave set patient up for cath. Notified Dr Harrell Gave of patient positive COVID test from 01-15-19.  Cath Procedure for 01-19-19 will be cancelled.  Procedure can be rescheduled for 21 days with out being re swabbed.

## 2019-01-17 NOTE — Telephone Encounter (Signed)
Opened in error

## 2019-01-17 NOTE — Telephone Encounter (Addendum)
Spoke with pt and advise of positive COVID results and Dr. Judeth Cornfield recommendations. Pt state he feels fine and has no symptoms. Pt advised to quarantine for 14 days and continue to monitor symptom. Pt recommended to contact pcp if he develops symptoms or report to ER if he becomes increasingly SOB. Pt voiced understanding.

## 2019-01-17 NOTE — Progress Notes (Addendum)
Patient tested positve for COVID 19, Flow Coordinator for Cath Lab notified, stated she will contact Dr. Harrell Gave about patients positive results.  IP notified also  Dr. Harrell Gave response that the patient can be re-added to cath schedule in 21 days

## 2019-01-17 NOTE — Telephone Encounter (Signed)
Received notification from Rennis Harding, cath lab RN, that patient's covid test from 01/15/19 returned positive. Will need to cancel cath scheduled for 8/5. Can reschedule for >21 days without need for repeat Covid test.  Attempted to call patient at listed number, left message for him to call the office back to get results.  Buford Dresser, MD, PhD [facility]  [address] [place],  [address] [phone]

## 2019-01-18 DIAGNOSIS — L97919 Non-pressure chronic ulcer of unspecified part of right lower leg with unspecified severity: Secondary | ICD-10-CM | POA: Diagnosis not present

## 2019-01-19 ENCOUNTER — Encounter (HOSPITAL_COMMUNITY): Admission: RE | Payer: Self-pay | Source: Home / Self Care

## 2019-01-19 ENCOUNTER — Ambulatory Visit (HOSPITAL_COMMUNITY)
Admission: RE | Admit: 2019-01-19 | Payer: BLUE CROSS/BLUE SHIELD | Source: Home / Self Care | Admitting: Internal Medicine

## 2019-01-19 SURGERY — RIGHT/LEFT HEART CATH AND CORONARY ANGIOGRAPHY
Anesthesia: LOCAL

## 2019-01-20 ENCOUNTER — Encounter: Payer: Self-pay | Admitting: Cardiology

## 2019-01-28 ENCOUNTER — Encounter (HOSPITAL_BASED_OUTPATIENT_CLINIC_OR_DEPARTMENT_OTHER): Payer: BLUE CROSS/BLUE SHIELD | Attending: Internal Medicine

## 2019-01-28 DIAGNOSIS — W19XXXA Unspecified fall, initial encounter: Secondary | ICD-10-CM | POA: Insufficient documentation

## 2019-01-28 DIAGNOSIS — L97212 Non-pressure chronic ulcer of right calf with fat layer exposed: Secondary | ICD-10-CM | POA: Insufficient documentation

## 2019-01-28 DIAGNOSIS — I110 Hypertensive heart disease with heart failure: Secondary | ICD-10-CM | POA: Insufficient documentation

## 2019-01-28 DIAGNOSIS — S81011A Laceration without foreign body, right knee, initial encounter: Secondary | ICD-10-CM | POA: Insufficient documentation

## 2019-01-28 DIAGNOSIS — Z8619 Personal history of other infectious and parasitic diseases: Secondary | ICD-10-CM | POA: Insufficient documentation

## 2019-01-28 DIAGNOSIS — I87331 Chronic venous hypertension (idiopathic) with ulcer and inflammation of right lower extremity: Secondary | ICD-10-CM | POA: Insufficient documentation

## 2019-01-28 DIAGNOSIS — I5022 Chronic systolic (congestive) heart failure: Secondary | ICD-10-CM | POA: Insufficient documentation

## 2019-01-28 DIAGNOSIS — L97819 Non-pressure chronic ulcer of other part of right lower leg with unspecified severity: Secondary | ICD-10-CM | POA: Insufficient documentation

## 2019-01-28 DIAGNOSIS — Z9181 History of falling: Secondary | ICD-10-CM | POA: Insufficient documentation

## 2019-01-28 DIAGNOSIS — E11622 Type 2 diabetes mellitus with other skin ulcer: Secondary | ICD-10-CM | POA: Insufficient documentation

## 2019-01-28 DIAGNOSIS — S81012A Laceration without foreign body, left knee, initial encounter: Secondary | ICD-10-CM | POA: Insufficient documentation

## 2019-01-28 DIAGNOSIS — L97829 Non-pressure chronic ulcer of other part of left lower leg with unspecified severity: Secondary | ICD-10-CM | POA: Insufficient documentation

## 2019-01-28 DIAGNOSIS — G4730 Sleep apnea, unspecified: Secondary | ICD-10-CM | POA: Insufficient documentation

## 2019-01-31 ENCOUNTER — Emergency Department (HOSPITAL_COMMUNITY): Payer: BLUE CROSS/BLUE SHIELD

## 2019-01-31 ENCOUNTER — Inpatient Hospital Stay (HOSPITAL_COMMUNITY)
Admission: EM | Admit: 2019-01-31 | Discharge: 2019-02-05 | DRG: 177 | Disposition: A | Discharge status: Home / Self Care | Payer: BLUE CROSS/BLUE SHIELD | Attending: Internal Medicine | Admitting: Internal Medicine

## 2019-01-31 ENCOUNTER — Encounter (HOSPITAL_COMMUNITY): Payer: Self-pay

## 2019-01-31 ENCOUNTER — Other Ambulatory Visit: Payer: Self-pay

## 2019-01-31 DIAGNOSIS — Z91048 Other nonmedicinal substance allergy status: Secondary | ICD-10-CM | POA: Diagnosis not present

## 2019-01-31 DIAGNOSIS — R001 Bradycardia, unspecified: Secondary | ICD-10-CM | POA: Diagnosis not present

## 2019-01-31 DIAGNOSIS — E785 Hyperlipidemia, unspecified: Secondary | ICD-10-CM | POA: Diagnosis not present

## 2019-01-31 DIAGNOSIS — I2510 Atherosclerotic heart disease of native coronary artery without angina pectoris: Secondary | ICD-10-CM | POA: Diagnosis present

## 2019-01-31 DIAGNOSIS — E1169 Type 2 diabetes mellitus with other specified complication: Secondary | ICD-10-CM | POA: Diagnosis not present

## 2019-01-31 DIAGNOSIS — N183 Chronic kidney disease, stage 3 (moderate): Secondary | ICD-10-CM | POA: Diagnosis not present

## 2019-01-31 DIAGNOSIS — I130 Hypertensive heart and chronic kidney disease with heart failure and stage 1 through stage 4 chronic kidney disease, or unspecified chronic kidney disease: Secondary | ICD-10-CM | POA: Diagnosis not present

## 2019-01-31 DIAGNOSIS — E11622 Type 2 diabetes mellitus with other skin ulcer: Secondary | ICD-10-CM | POA: Diagnosis present

## 2019-01-31 DIAGNOSIS — I517 Cardiomegaly: Secondary | ICD-10-CM | POA: Diagnosis not present

## 2019-01-31 DIAGNOSIS — J1289 Other viral pneumonia: Secondary | ICD-10-CM | POA: Diagnosis present

## 2019-01-31 DIAGNOSIS — E039 Hypothyroidism, unspecified: Secondary | ICD-10-CM | POA: Diagnosis not present

## 2019-01-31 DIAGNOSIS — R42 Dizziness and giddiness: Secondary | ICD-10-CM

## 2019-01-31 DIAGNOSIS — Z7984 Long term (current) use of oral hypoglycemic drugs: Secondary | ICD-10-CM

## 2019-01-31 DIAGNOSIS — E11649 Type 2 diabetes mellitus with hypoglycemia without coma: Secondary | ICD-10-CM | POA: Diagnosis present

## 2019-01-31 DIAGNOSIS — E1142 Type 2 diabetes mellitus with diabetic polyneuropathy: Secondary | ICD-10-CM | POA: Diagnosis present

## 2019-01-31 DIAGNOSIS — I255 Ischemic cardiomyopathy: Secondary | ICD-10-CM | POA: Diagnosis present

## 2019-01-31 DIAGNOSIS — I440 Atrioventricular block, first degree: Secondary | ICD-10-CM | POA: Diagnosis present

## 2019-01-31 DIAGNOSIS — L97209 Non-pressure chronic ulcer of unspecified calf with unspecified severity: Secondary | ICD-10-CM | POA: Diagnosis present

## 2019-01-31 DIAGNOSIS — Z6835 Body mass index (BMI) 35.0-35.9, adult: Secondary | ICD-10-CM

## 2019-01-31 DIAGNOSIS — G4730 Sleep apnea, unspecified: Secondary | ICD-10-CM | POA: Diagnosis present

## 2019-01-31 DIAGNOSIS — Z79899 Other long term (current) drug therapy: Secondary | ICD-10-CM

## 2019-01-31 DIAGNOSIS — R296 Repeated falls: Secondary | ICD-10-CM | POA: Diagnosis present

## 2019-01-31 DIAGNOSIS — U071 COVID-19: Secondary | ICD-10-CM | POA: Diagnosis not present

## 2019-01-31 DIAGNOSIS — E875 Hyperkalemia: Secondary | ICD-10-CM | POA: Diagnosis present

## 2019-01-31 DIAGNOSIS — N179 Acute kidney failure, unspecified: Secondary | ICD-10-CM | POA: Diagnosis present

## 2019-01-31 DIAGNOSIS — E860 Dehydration: Secondary | ICD-10-CM | POA: Diagnosis present

## 2019-01-31 DIAGNOSIS — I4891 Unspecified atrial fibrillation: Secondary | ICD-10-CM | POA: Diagnosis present

## 2019-01-31 DIAGNOSIS — Z800 Family history of malignant neoplasm of digestive organs: Secondary | ICD-10-CM

## 2019-01-31 DIAGNOSIS — R918 Other nonspecific abnormal finding of lung field: Secondary | ICD-10-CM | POA: Diagnosis not present

## 2019-01-31 DIAGNOSIS — Z86010 Personal history of colonic polyps: Secondary | ICD-10-CM

## 2019-01-31 DIAGNOSIS — I700 Atherosclerosis of aorta: Secondary | ICD-10-CM | POA: Diagnosis not present

## 2019-01-31 DIAGNOSIS — I480 Paroxysmal atrial fibrillation: Secondary | ICD-10-CM | POA: Diagnosis not present

## 2019-01-31 DIAGNOSIS — Z8249 Family history of ischemic heart disease and other diseases of the circulatory system: Secondary | ICD-10-CM

## 2019-01-31 DIAGNOSIS — I10 Essential (primary) hypertension: Secondary | ICD-10-CM | POA: Diagnosis not present

## 2019-01-31 DIAGNOSIS — I5022 Chronic systolic (congestive) heart failure: Secondary | ICD-10-CM | POA: Diagnosis not present

## 2019-01-31 DIAGNOSIS — E162 Hypoglycemia, unspecified: Secondary | ICD-10-CM

## 2019-01-31 DIAGNOSIS — Z7951 Long term (current) use of inhaled steroids: Secondary | ICD-10-CM

## 2019-01-31 DIAGNOSIS — R531 Weakness: Secondary | ICD-10-CM | POA: Diagnosis not present

## 2019-01-31 LAB — URINALYSIS, ROUTINE W REFLEX MICROSCOPIC
Bilirubin Urine: NEGATIVE
Glucose, UA: NEGATIVE mg/dL
Hgb urine dipstick: NEGATIVE
Ketones, ur: NEGATIVE mg/dL
Leukocytes,Ua: NEGATIVE
Nitrite: NEGATIVE
Protein, ur: NEGATIVE mg/dL
Specific Gravity, Urine: 1.009 (ref 1.005–1.030)
pH: 5 (ref 5.0–8.0)

## 2019-01-31 LAB — CBC
HCT: 50.3 % (ref 39.0–52.0)
Hemoglobin: 15.7 g/dL (ref 13.0–17.0)
MCH: 28.5 pg (ref 26.0–34.0)
MCHC: 31.2 g/dL (ref 30.0–36.0)
MCV: 91.5 fL (ref 80.0–100.0)
Platelets: 297 10*3/uL (ref 150–400)
RBC: 5.5 MIL/uL (ref 4.22–5.81)
RDW: 16.1 % — ABNORMAL HIGH (ref 11.5–15.5)
WBC: 5.6 10*3/uL (ref 4.0–10.5)
nRBC: 0 % (ref 0.0–0.2)

## 2019-01-31 LAB — ABO/RH: ABO/RH(D): B POS

## 2019-01-31 LAB — BASIC METABOLIC PANEL
Anion gap: 11 (ref 5–15)
Anion gap: 9 (ref 5–15)
BUN: 85 mg/dL — ABNORMAL HIGH (ref 8–23)
BUN: 80 mg/dL — ABNORMAL HIGH (ref 8–23)
CO2: 18 mmol/L — ABNORMAL LOW (ref 22–32)
CO2: 20 mmol/L — ABNORMAL LOW (ref 22–32)
Calcium: 8.7 mg/dL — ABNORMAL LOW (ref 8.9–10.3)
Calcium: 8.7 mg/dL — ABNORMAL LOW (ref 8.9–10.3)
Chloride: 106 mmol/L (ref 98–111)
Chloride: 104 mmol/L (ref 98–111)
Creatinine, Ser: 3.92 mg/dL — ABNORMAL HIGH (ref 0.61–1.24)
Creatinine, Ser: 3.49 mg/dL — ABNORMAL HIGH (ref 0.61–1.24)
GFR calc Af Amer: 18 mL/min — ABNORMAL LOW (ref >60)
GFR calc Af Amer: 20 mL/min — ABNORMAL LOW (ref >60)
GFR calc non Af Amer: 15 mL/min — ABNORMAL LOW (ref >60)
GFR calc non Af Amer: 17 mL/min — ABNORMAL LOW (ref >60)
Glucose, Bld: 82 mg/dL (ref 70–99)
Glucose, Bld: 117 mg/dL — ABNORMAL HIGH (ref 70–99)
Potassium: 6.2 mmol/L — ABNORMAL HIGH (ref 3.5–5.1)
Potassium: 6 mmol/L — ABNORMAL HIGH (ref 3.5–5.1)
Sodium: 135 mmol/L (ref 135–145)
Sodium: 133 mmol/L — ABNORMAL LOW (ref 135–145)

## 2019-01-31 LAB — LACTATE DEHYDROGENASE: LDH: 302 U/L — ABNORMAL HIGH (ref 98–192)

## 2019-01-31 LAB — CBG MONITORING, ED
Glucose-Capillary: 46 mg/dL — ABNORMAL LOW (ref 70–99)
Glucose-Capillary: 49 mg/dL — ABNORMAL LOW (ref 70–99)
Glucose-Capillary: 54 mg/dL — ABNORMAL LOW (ref 70–99)
Glucose-Capillary: 106 mg/dL — ABNORMAL HIGH (ref 70–99)
Glucose-Capillary: 77 mg/dL (ref 70–99)

## 2019-01-31 LAB — SEDIMENTATION RATE: Sed Rate: 29 mm/hr — ABNORMAL HIGH (ref 0–16)

## 2019-01-31 LAB — C-REACTIVE PROTEIN: CRP: 9.6 mg/dL — ABNORMAL HIGH (ref <1.0)

## 2019-01-31 LAB — HEMOGLOBIN A1C
Hgb A1c MFr Bld: 6.7 % — ABNORMAL HIGH (ref 4.8–5.6)
Mean Plasma Glucose: 145.59 mg/dL

## 2019-01-31 LAB — NA AND K (SODIUM & POTASSIUM), RAND UR
Potassium Urine: 26 mmol/L
Sodium, Ur: 60 mmol/L

## 2019-01-31 LAB — PROCALCITONIN: Procalcitonin: 0.47 ng/mL

## 2019-01-31 LAB — CK: Total CK: 257 U/L (ref 49–397)

## 2019-01-31 LAB — SARS CORONAVIRUS 2 BY RT PCR (HOSPITAL ORDER, PERFORMED IN ~~LOC~~ HOSPITAL LAB): SARS Coronavirus 2: POSITIVE — AB

## 2019-01-31 LAB — TROPONIN I (HIGH SENSITIVITY): Troponin I (High Sensitivity): 33 ng/L — ABNORMAL HIGH (ref <18)

## 2019-01-31 LAB — TSH: TSH: 25.834 u[IU]/mL — ABNORMAL HIGH (ref 0.350–4.500)

## 2019-01-31 LAB — OSMOLALITY: Osmolality: 318 mOsm/kg — ABNORMAL HIGH (ref 275–295)

## 2019-01-31 LAB — CHLORIDE, URINE, RANDOM: Chloride Urine: 63 mmol/L

## 2019-01-31 LAB — FERRITIN: Ferritin: 1107 ng/mL — ABNORMAL HIGH (ref 24–336)

## 2019-01-31 LAB — OSMOLALITY, URINE: Osmolality, Ur: 317 mOsm/kg (ref 300–900)

## 2019-01-31 MED ORDER — ROSUVASTATIN CALCIUM 5 MG PO TABS
10.0000 mg | ORAL_TABLET | Freq: Every day | ORAL | Status: DC
  Administered 2019-02-01 – 2019-02-04 (×5): 10 mg via ORAL
  Filled 2019-01-31 (×5): qty 2

## 2019-01-31 MED ORDER — SODIUM CHLORIDE 0.9% FLUSH
3.0000 mL | Freq: Once | INTRAVENOUS | Status: AC
  Administered 2019-01-31: 3 mL via INTRAVENOUS

## 2019-01-31 MED ORDER — ONDANSETRON HCL 4 MG PO TABS: 4.0000 mg | ORAL_TABLET | Freq: Four times a day (QID) | ORAL | Status: DC | PRN

## 2019-01-31 MED ORDER — ONDANSETRON HCL 4 MG/2ML IJ SOLN: 4.0000 mg | Freq: Four times a day (QID) | INTRAMUSCULAR | Status: DC | PRN

## 2019-01-31 MED ORDER — POLYVINYL ALCOHOL 1.4 % OP SOLN
1.0000 [drp] | OPHTHALMIC | Status: DC | PRN
  Filled 2019-01-31: qty 15

## 2019-01-31 MED ORDER — ACETAMINOPHEN 325 MG PO TABS: 650.0000 mg | ORAL_TABLET | Freq: Four times a day (QID) | ORAL | Status: DC | PRN

## 2019-01-31 MED ORDER — HYDROCORTISONE 1 % EX CREA
1.0000 "application " | TOPICAL_CREAM | Freq: Three times a day (TID) | CUTANEOUS | Status: DC | PRN
  Filled 2019-01-31: qty 28

## 2019-01-31 MED ORDER — ACETAMINOPHEN 650 MG RE SUPP: 650.0000 mg | Freq: Four times a day (QID) | RECTAL | Status: DC | PRN

## 2019-01-31 MED ORDER — MOMETASONE FURO-FORMOTEROL FUM 100-5 MCG/ACT IN AERO
2.0000 | INHALATION_SPRAY | Freq: Two times a day (BID) | RESPIRATORY_TRACT | Status: DC
  Administered 2019-02-01 – 2019-02-05 (×9): 2 via RESPIRATORY_TRACT
  Filled 2019-01-31: qty 8.8

## 2019-01-31 MED ORDER — SENNOSIDES-DOCUSATE SODIUM 8.6-50 MG PO TABS
2.0000 | ORAL_TABLET | Freq: Every evening | ORAL | Status: DC | PRN
  Administered 2019-02-01: 2 via ORAL
  Filled 2019-01-31: qty 2

## 2019-01-31 MED ORDER — SODIUM ZIRCONIUM CYCLOSILICATE 10 G PO PACK
10.0000 g | PACK | Freq: Once | ORAL | Status: AC
  Administered 2019-01-31: 15:00:00 10 g via ORAL
  Filled 2019-01-31: qty 1

## 2019-01-31 MED ORDER — ALUM & MAG HYDROXIDE-SIMETH 200-200-20 MG/5ML PO SUSP: 30.0000 mL | ORAL | Status: DC | PRN

## 2019-01-31 MED ORDER — SODIUM CHLORIDE 0.9 % IV SOLN
INTRAVENOUS | Status: DC
  Administered 2019-02-01: 01:00:00 via INTRAVENOUS

## 2019-01-31 MED ORDER — LIP MEDEX EX OINT
1.0000 "application " | TOPICAL_OINTMENT | CUTANEOUS | Status: DC | PRN
  Filled 2019-01-31: qty 7

## 2019-01-31 MED ORDER — METHYLPREDNISOLONE SODIUM SUCC 40 MG IJ SOLR
40.0000 mg | Freq: Once | INTRAMUSCULAR | Status: AC
  Administered 2019-02-01: 40 mg via INTRAVENOUS
  Filled 2019-01-31: qty 1

## 2019-01-31 MED ORDER — DEXTROSE 50 % IV SOLN
INTRAVENOUS | Status: AC
  Filled 2019-01-31: qty 50

## 2019-01-31 MED ORDER — HYDROCORTISONE (PERIANAL) 2.5 % EX CREA
1.0000 "application " | TOPICAL_CREAM | Freq: Four times a day (QID) | CUTANEOUS | Status: DC | PRN
  Filled 2019-01-31: qty 28.35

## 2019-01-31 MED ORDER — SALINE SPRAY 0.65 % NA SOLN
1.0000 | NASAL | Status: DC | PRN
  Filled 2019-01-31: qty 44

## 2019-01-31 MED ORDER — SENNOSIDES-DOCUSATE SODIUM 8.6-50 MG PO TABS: 1.0000 | ORAL_TABLET | Freq: Every evening | ORAL | Status: DC | PRN

## 2019-01-31 MED ORDER — GABAPENTIN 300 MG PO CAPS
300.0000 mg | ORAL_CAPSULE | Freq: Two times a day (BID) | ORAL | Status: DC
  Administered 2019-02-01 – 2019-02-05 (×10): 300 mg via ORAL
  Filled 2019-01-31 (×11): qty 1

## 2019-01-31 MED ORDER — PHENOL 1.4 % MT LIQD
1.0000 | OROMUCOSAL | Status: DC | PRN
  Filled 2019-01-31: qty 177

## 2019-01-31 MED ORDER — ALBUTEROL SULFATE HFA 108 (90 BASE) MCG/ACT IN AERS
2.0000 | INHALATION_SPRAY | Freq: Four times a day (QID) | RESPIRATORY_TRACT | Status: DC | PRN
  Filled 2019-01-31: qty 6.7

## 2019-01-31 MED ORDER — POLYETHYLENE GLYCOL 3350 17 G PO PACK: 17.0000 g | PACK | Freq: Every day | ORAL | Status: DC | PRN

## 2019-01-31 MED ORDER — AMIODARONE HCL 200 MG PO TABS
200.0000 mg | ORAL_TABLET | Freq: Every day | ORAL | Status: DC
  Filled 2019-01-31: qty 1

## 2019-01-31 MED ORDER — DEXTROSE 50 % IV SOLN
1.0000 | Freq: Once | INTRAVENOUS | Status: AC
  Administered 2019-01-31: 50 mL via INTRAVENOUS

## 2019-01-31 MED ORDER — SODIUM CHLORIDE 0.9 % IV BOLUS
500.0000 mL | Freq: Once | INTRAVENOUS | Status: AC
  Administered 2019-01-31: 15:00:00 500 mL via INTRAVENOUS

## 2019-01-31 MED ORDER — GUAIFENESIN-DM 100-10 MG/5ML PO SYRP
5.0000 mL | ORAL_SOLUTION | ORAL | Status: DC | PRN
  Administered 2019-02-01 – 2019-02-02 (×2): 5 mL via ORAL
  Filled 2019-01-31 (×2): qty 10

## 2019-01-31 MED ORDER — LORATADINE 10 MG PO TABS: 10.0000 mg | ORAL_TABLET | Freq: Every day | ORAL | Status: DC | PRN

## 2019-01-31 MED ORDER — HYDRALAZINE HCL 20 MG/ML IJ SOLN: 10.0000 mg | INTRAMUSCULAR | Status: DC | PRN

## 2019-01-31 MED ORDER — MUSCLE RUB 10-15 % EX CREA
1.0000 "application " | TOPICAL_CREAM | CUTANEOUS | Status: DC | PRN
  Filled 2019-01-31: qty 85

## 2019-01-31 MED ORDER — DABIGATRAN ETEXILATE MESYLATE 150 MG PO CAPS
150.0000 mg | ORAL_CAPSULE | Freq: Two times a day (BID) | ORAL | Status: DC
  Administered 2019-02-01 – 2019-02-05 (×9): 150 mg via ORAL
  Filled 2019-01-31 (×11): qty 1

## 2019-01-31 MED ORDER — INSULIN ASPART 100 UNIT/ML ~~LOC~~ SOLN
0.0000 [IU] | Freq: Three times a day (TID) | SUBCUTANEOUS | Status: DC
  Administered 2019-02-01: 3 [IU] via SUBCUTANEOUS
  Administered 2019-02-01 (×2): 7 [IU] via SUBCUTANEOUS
  Administered 2019-02-02: 2 [IU] via SUBCUTANEOUS
  Administered 2019-02-02: 1 [IU] via SUBCUTANEOUS
  Administered 2019-02-02: 3 [IU] via SUBCUTANEOUS
  Administered 2019-02-03: 2 [IU] via SUBCUTANEOUS
  Administered 2019-02-03 – 2019-02-04 (×2): 3 [IU] via SUBCUTANEOUS
  Administered 2019-02-04 – 2019-02-05 (×3): 1 [IU] via SUBCUTANEOUS

## 2019-01-31 NOTE — ED Notes (Signed)
Called [facility] for transport to [facility]

## 2019-01-31 NOTE — ED Triage Notes (Signed)
Pt diagnosed with Coronovirus 8-1, clear date 8-14.  Pt stated he didn't have any symptoms.  Onset 2 weeks loss of balance, dizziness, multiple falls.   No dizziness at this time.  Abrasions on bilateral knees, left shin.

## 2019-01-31 NOTE — ED Notes (Signed)
Pt given orange juice. PA aware of CBG 49

## 2019-01-31 NOTE — H&P (Addendum)
History and Physical    [name] AOZ:[id number] DOB: May 06, 1956 DOA: 01/31/2019  PCP: [name], MD Patient coming from: Home  Chief Complaint: Dizziness  HPI: [name] is a 63 y.o. male with medical history significant of atrial fibrillation/flutter flutter status post cardioversion on Pradaxa, systolic CHF ejection fraction 30%, essential hypertension, diabetes mellitus type 2, hyperlipidemia, peripheral neuropathy came to the hospital with complains of dizziness and weakness for the past 2 weeks which is worsened over the last week.  Patient states he was supposed to have his cardiac work-up done earlier this month for reduced ejection fraction-left and right heart catheterization.  This was postponed due to him being tested positive for COVID-19.  His Coreg was increased to 12.5 mg twice daily at that time for better ventricular rate control. Due to worsening of the symptoms he came to the hospital today.  Denies any fevers, chills, shortness of breath, cough, abdominal pain, nausea, vomiting any other complaints. In the ER patient was noted to be slightly bradycardic with heart rate in 40s.  His EKG showed first-degree AV block.  His labs showed acute kidney injury with creatinine of 3.9, up from baseline of 1.7.  COVID - still positive in the ER today.   Review of Systems: As per HPI otherwise 10 point review of systems negative.  Review of Systems Otherwise negative except as per HPI, including: General: Denies fever, chills, night sweats or unintended weight loss. Resp: Denies cough, wheezing, shortness of breath. Cardiac: Denies chest pain, palpitations, orthopnea, paroxysmal nocturnal dyspnea. GI: Denies abdominal pain, nausea, vomiting, diarrhea or constipation GU: Denies dysuria, frequency, hesitancy or incontinence MS: Denies muscle aches, joint pain or swelling Neuro: Denies headache, neurologic deficits (focal weakness, numbness, tingling), abnormal  gait Psych: Denies anxiety, depression, SI/HI/AVH Skin: Denies new rashes or lesions ID: Denies sick contacts, exotic exposures, travel  Past Medical History:  Diagnosis Date  . Allergy    SEASONAL  . Asthma   . Atrial fibrillation ([place])    persistant 02/2009  . Atrial flutter ([place])    s/p CTI ablation 08/06/10  . Cardiac arrhythmia due to congenital heart disease   . Cataract    BEGINING  . Chronic rhinitis   . Chronic systolic dysfunction of left ventricle   . Colon polyp   . Congestive heart failure ([place])   . Diverticulosis    colonoscopy 04/03/2009  . Hyperlipidemia   . Hypertension   . Hypertension   . Morbid obesity ([place])    target weight = 219  for BMI < 30  . Nonischemic cardiomyopathy ([facility])    tachycardia mediated  . Sleep apnea    original 2001    Past Surgical History:  Procedure Laterality Date  . ATRIAL FIBRILLATION ABLATION N/A 12/14/2018   Procedure: ATRIAL FIBRILLATION ABLATION;  Surgeon: Thompson Grayer, MD;  Location: [facility];  Service: Cardiovascular;  Laterality: N/A;  . atrial flutter ablation  08/06/10  . CARDIOVERSION N/A 07/05/2018   Procedure: CARDIOVERSION;  Surgeon: Buford Dresser, MD;  Location: [facility];  Service: Cardiovascular;  Laterality: N/A;  . COLONOSCOPY    . TEE WITHOUT CARDIOVERSION N/A 12/14/2018   Procedure: TRANSESOPHAGEAL ECHOCARDIOGRAM (TEE);  Surgeon: Thompson Grayer, MD;  Location: [facility];  Service: Cardiovascular;  Laterality: N/A;    SOCIAL HISTORY:  reports that he has never smoked. He has never used smokeless tobacco. He reports current alcohol use. He reports that he does not use drugs.  No Known  Allergies  FAMILY HISTORY: Family History  Problem Relation Age of Onset  . Hypertension Mother   . Kidney failure Mother        Due to sepsis  . Colon cancer Father 81  . COPD Father   . Colon cancer Brother   . Colon polyps Neg Hx   . Diabetes Neg Hx   . Kidney disease Neg Hx   .  Esophageal cancer Neg Hx      Prior to Admission medications   Medication Sig Start Date End Date Taking? Authorizing Provider  albuterol (PROVENTIL HFA;VENTOLIN HFA) 108 (90 Base) MCG/ACT inhaler Inhale 2 puffs into the lungs every 6 (six) hours as needed for wheezing or shortness of breath.   Yes [provider]  amiodarone (PACERONE) 200 MG tablet Take 1 tablet (200 mg total) by mouth daily. 07/13/18  Yes Sherran Needs, NP  carvedilol (COREG) 25 MG tablet Take 0.5 tablets (12.5 mg total) by mouth 2 (two) times daily with a meal. Patient taking differently: Take 25 mg by mouth daily.  07/13/18   Sherran Needs, NP  dabigatran (PRADAXA) 150 MG CAPS capsule Take 1 capsule (150 mg total) by mouth 2 (two) times daily. 12/15/18   Allred, Jeneen Rinks, MD  doxylamine, Sleep, (UNISOM) 25 MG tablet Take 50 mg by mouth at bedtime.    [provider]  furosemide (LASIX) 80 MG tablet TAKE 1/2-1 TABLET BY MOUTH DAILY AS NEEDED FOR FLUID OR EDEMA. Patient taking differently: Take 80 mg by mouth daily.  01/12/19   Baldwin Jamaica, PA-C  gabapentin (NEURONTIN) 300 MG capsule TAKE 1 CAPSULE BY MOUTH THREE TIMES A DAY Patient taking differently: Take 300 mg by mouth 2 (two) times daily.  08/13/18   [name], MD  glipiZIDE (GLUCOTROL) 5 MG tablet TAKE 1 TABLET BY MOUTH TWICE A DAY BEFORE MEALS/ NEED APPOINTMENT Patient taking differently: Take 5 mg by mouth 2 (two) times a day.  11/16/18   [name], MD  KLOR-CON M20 20 MEQ tablet TAKE 1 TABLET BY MOUTH TWICE A DAY Patient taking differently: Take 20 mEq by mouth 2 (two) times daily.  07/21/17   Baldwin Jamaica, PA-C  mometasone (NASONEX) 50 MCG/ACT nasal spray Place 2 sprays into the nose daily as needed (allergies or rhinitis).     [provider]  rosuvastatin (CRESTOR) 10 MG tablet TAKE 1 TABLET BY MOUTH EVERY DAY Patient taking differently: Take 10 mg by mouth at bedtime.  11/16/18   Allred, Jeneen Rinks, MD  sildenafil  (VIAGRA) 100 MG tablet Take 1 tablet (100 mg total) by mouth as directed. Patient taking differently: Take 100 mg by mouth as needed for erectile dysfunction.  09/13/15   Allred, Jeneen Rinks, MD  SYMBICORT 80-4.5 MCG/ACT inhaler USE 2 INHALATIONS TWICE A DAY Patient taking differently: Inhale 2 puffs into the lungs 2 (two) times daily as needed (shortness of breath).  02/12/17   [name], MD  valsartan (DIOVAN) 320 MG tablet Take 1 tablet (320 mg total) by mouth daily. 12/15/18   Thompson Grayer, MD    Physical Exam: Vitals:   01/31/19 1300 01/31/19 1406 01/31/19 1445 01/31/19 1645  BP: (!) 158/129 108/66 103/69 113/74  Pulse: (!) 47 (!) 45    Resp: 18 17 (!) 22 18  Temp: (!) 97.2 F (36.2 C)     TempSrc: Oral     SpO2: 98% 98%        Constitutional: NAD, calm, comfortable Eyes: PERRL,  lids and conjunctivae normal ENMT: Mucous membranes are moist. Posterior pharynx clear of any exudate or lesions.Normal dentition.  Neck: normal, supple, no masses, no thyromegaly Respiratory: Minimal bibasilar crackles Cardiovascular: Regular rate and rhythm, no murmurs / rubs / gallops. No extremity edema. 2+ pedal pulses. No carotid bruits.  Abdomen: no tenderness, no masses palpated. No hepatosplenomegaly. Bowel sounds positive.  Musculoskeletal: no clubbing / cyanosis. No joint deformity upper and lower extremities. Good ROM, no contractures. Normal muscle tone.  Skin: no rashes, lesions, ulcers. No induration Neurologic: CN 2-12 grossly intact. Sensation intact, DTR normal. Strength 5/5 in all 4.  Psychiatric: Normal judgment and insight. Alert and oriented x 3. Normal mood.     Labs on Admission: I have personally reviewed following labs and imaging studies  CBC: Recent Labs  Lab 01/31/19 1326  WBC 5.6  HGB 15.7  HCT 50.3  MCV 91.5  PLT 818   Basic Metabolic Panel: Recent Labs  Lab 01/31/19 1326  NA 135  K 6.2*  CL 106  CO2 18*  GLUCOSE 82  BUN 85*  CREATININE 3.92*   CALCIUM 8.7*   GFR: CrCl cannot be calculated (Unknown ideal weight.). Liver Function Tests: No results for input(s): AST, ALT, ALKPHOS, BILITOT, PROT, ALBUMIN in the last 168 hours. No results for input(s): LIPASE, AMYLASE in the last 168 hours. No results for input(s): AMMONIA in the last 168 hours. Coagulation Profile: No results for input(s): INR, PROTIME in the last 168 hours. Cardiac Enzymes: No results for input(s): CKTOTAL, CKMB, CKMBINDEX, TROPONINI in the last 168 hours. BNP (last 3 results) No results for input(s): PROBNP in the last 8760 hours. HbA1C: No results for input(s): HGBA1C in the last 72 hours. CBG: Recent Labs  Lab 01/31/19 1521 01/31/19 1523 01/31/19 1555 01/31/19 1724 01/31/19 1850  GLUCAP 49* 46* 54* 106* 77   Lipid Profile: No results for input(s): CHOL, HDL, LDLCALC, TRIG, CHOLHDL, LDLDIRECT in the last 72 hours. Thyroid Function Tests: No results for input(s): TSH, T4TOTAL, FREET4, T3FREE, THYROIDAB in the last 72 hours. Anemia Panel: No results for input(s): VITAMINB12, FOLATE, FERRITIN, TIBC, IRON, RETICCTPCT in the last 72 hours. Urine analysis:    Component Value Date/Time   COLORURINE YELLOW 01/31/2019 1730   APPEARANCEUR CLEAR 01/31/2019 1730   LABSPEC 1.009 01/31/2019 1730   PHURINE 5.0 01/31/2019 1730   GLUCOSEU NEGATIVE 01/31/2019 1730   GLUCOSEU NEG mg/dL 05/18/2010 0124   HGBUR NEGATIVE 01/31/2019 1730   BILIRUBINUR NEGATIVE 01/31/2019 1730   KETONESUR NEGATIVE 01/31/2019 1730   PROTEINUR NEGATIVE 01/31/2019 1730   UROBILINOGEN 0.2 05/18/2010 0124   NITRITE NEGATIVE 01/31/2019 1730   LEUKOCYTESUR NEGATIVE 01/31/2019 1730   Sepsis Labs: !!!!!!!!!!!!!!!!!!!!!!!!!!!!!!!!!!!!!!!!!!!! @LABRCNTIP (procalcitonin:4,lacticidven:4) ) Recent Results (from the past 240 hour(s))  SARS Coronavirus 2 [facility] order, Performed in [facility] hospital lab) Nasopharyngeal Nasopharyngeal Swab     Status: Abnormal   Collection Time: 01/31/19   2:49 PM   Specimen: Nasopharyngeal Swab  Result Value Ref Range Status   SARS Coronavirus 2 POSITIVE (A) NEGATIVE Final    Comment: RESULT CALLED TO, READ BACK BY AND VERIFIED WITHRobynn Pane RN 1556 01/31/19 A BROWNING (NOTE) If result is NEGATIVE SARS-CoV-2 target nucleic acids are NOT DETECTED. The SARS-CoV-2 RNA is generally detectable in upper and lower  respiratory specimens during the acute phase of infection. The lowest  concentration of SARS-CoV-2 viral copies this assay can detect is 250  copies / mL. A negative result does not preclude SARS-CoV-2 infection  and should  not be used as the sole basis for treatment or other  patient management decisions.  A negative result may occur with  improper specimen collection / handling, submission of specimen other  than nasopharyngeal swab, presence of viral mutation(s) within the  areas targeted by this assay, and inadequate number of viral copies  (<250 copies / mL). A negative result must be combined with clinical  observations, patient history, and epidemiological information. If result is POSITIVE SARS-CoV-2 target nucleic acids are DETECTED.  The SARS-CoV-2 RNA is generally detectable in upper and lower  respiratory specimens during the acute phase of infection.  Positive  results are indicative of active infection with SARS-CoV-2.  Clinical  correlation with patient history and other diagnostic information is  necessary to determine patient infection status.  Positive results do  not rule out bacterial infection or co-infection with other viruses. If result is PRESUMPTIVE POSTIVE SARS-CoV-2 nucleic acids MAY BE PRESENT.   A presumptive positive result was obtained on the submitted specimen  and confirmed on repeat testing.  While 2019 novel coronavirus  (SARS-CoV-2) nucleic acids may be present in the submitted sample  additional confirmatory testing may be necessary for epidemiological  and / or clinical management purposes   to differentiate between  SARS-CoV-2 and other Sarbecovirus currently known to infect humans.  If clinically indicated additional testing with an alternate test  methodology [phone])  is advised. The SARS-CoV-2 RNA is generally  detectable in upper and lower respiratory specimens during the acute  phase of infection. The expected result is Negative. Fact Sheet for Patients:  StrictlyIdeas.no Fact Sheet for Healthcare Providers: BankingDealers.co.za This test is not yet approved or cleared by the [place] FDA and has been authorized for detection and/or diagnosis of SARS-CoV-2 by FDA under an Emergency Use Authorization (EUA).  This EUA will remain in effect (meaning this test can be used) for the duration of the COVID-19 declaration under Section 564(b)(1) of the Act, 21 U.S.C. section 360bbb-3(b)(1), unless the authorization is terminated or revoked sooner. Performed at [facility], [place] [address], [place], [place] [address]      Radiological Exams on Admission: Ct Head Wo Contrast  Result Date: 01/31/2019 CLINICAL DATA:  63 year old male with vertigo, dizziness and multiple falls. EXAM: CT HEAD WITHOUT CONTRAST TECHNIQUE: Contiguous axial images were obtained from the base of the skull through the vertex without intravenous contrast. COMPARISON:  None. FINDINGS: Brain: No evidence of acute infarction, hemorrhage, hydrocephalus, extra-axial collection or mass lesion/mass effect. Atrophy and mild to moderate white matter hypodensities are noted, likely representing chronic small-vessel white matter ischemic changes. Vascular: Carotid and vertebral atherosclerotic calcifications are noted. Skull: Normal. Negative for fracture or focal lesion. Sinuses/Orbits: A small amount of fluid and mucosal thickening within both maxillary sinuses noted. Other: None IMPRESSION: 1. No evidence of acute intracranial abnormality. 2. Atrophy and mild  to moderate probable chronic small-vessel white matter ischemic changes. 3. Small amount of fluid and mucosal thickening within both maxillary sinuses suggesting acute on chronic sinusitis. Electronically Signed   By: Margarette Canada M.D.   On: 01/31/2019 17:27   Dg Chest Port 1 View  Result Date: 01/31/2019 CLINICAL DATA:  Weakness EXAM: PORTABLE CHEST 1 VIEW COMPARISON:  08/10/2010 FINDINGS: Heart size is enlarged. Aortic calcifications are noted. There is scattered mild airspace opacities bilaterally, for example in the right upper lobe and at the left lung base. There is no pneumothorax. No large pleural effusion. There is no acute osseous abnormality. IMPRESSION: 1.  Scattered airspace opacities bilaterally. These are nonspecific but can be seen in patients with an atypical infectious process such as viral pneumonia. Follow-up to radiologic resolution is recommended. Consider a 4-6 week follow-up two-view chest x-ray to document stability or resolution of these findings. 2. Stable cardiac enlargement. Electronically Signed   By: Constance Holster M.D.   On: 01/31/2019 15:12     All images have been reviewed by me personally.  EKG: Independently reviewed.  Bradycardia with first-degree AV block  Assessment/Plan Principal Problem:   Symptomatic bradycardia Active Problems:   Hypothyroidism   Hyperlipidemia associated with type 2 diabetes mellitus (HCC)   ATRIAL FIBRILLATION   Chronic systolic heart failure (HCC)   Sleep apnea   Type 2 diabetes mellitus with hyperlipidemia (HCC)   Diabetic ulcer of calf associated with type 2 diabetes mellitus, with fat layer exposed ([place])   COVID-19 virus infection   AKI (acute kidney injury) ([place])   Dizziness    Weakness/dizziness - Unknown exact etiology-possibly combination of symptomatic bradycardia and dehydration. -Check TSH, PT/OT.  Acute kidney injury. 3.9. baseline 1.7 - Combination of prerenal versus intrinsic.  Gentle hydration overnight,  reassess need for more fluid tomorrow.  Urine electrolytes ordered. -Check bladder scan.  If no improvement, will proceed with further work-up.  Hyperkalemia; 6.2 - EKG shows first-degree AV block but no peaked T waves.  This was treated in the ER.  Will repeat lab work to ensure this is improved.  Symptomatic bradycardia with first-degree AV block. -Reviewed by cardiology, Dr Gardiner Rhyme.  Recommends holding off on beta-blocker for now.  Continue amiodarone and monitor on telemetry.  Will be available for consultation via E link -Check TSH.  Monitor electrolytes. - Although chest pain-free, check cardiac enzymes.  COVID 19 virus positive -Surprisingly patient is not symptomatic. Hypoxia in EMR (misdocumentation?).  Has been positive for at least 2 weeks.  Patient has remained asymptomatic.  Will hold off on further treatment, provide supportive care.  Chest x-ray shows possible opacity.  Follow him clinically.  Discussed with Dr Thereasa Solo. Appreciate his imput.  - Incentive spirometer. -We will check inflammatory markers in case the patient decompensates so we can get the baseline and trended. - We will start routine call with treatment if he decompensates.  Atrial fibrillation/flutter with prior ablation - Follows outpatient EP cardiology.  Recently increased his Coreg for better rate controlled.  Also recently switched from Xarelto to Pradaxa.  We will continue this.  Continue amiodarone -Cardioversion canceled on 12/14/2018 due to concerning thrombus on TEE  Systolic congestive heart failure, reduced ejection fraction, 30-35% - Appears to be compensated, possibly slightly intravascularly volume depleted.  Receiving gentle hydration.  Plan was to get outpatient left and right heart catheterization but postponed secondary to him being testing positive for COVID.  Cardiology aware of this.  Essential hypertension -Resume home medications except Coreg.  Holding off on nephrotoxic drugs.  We will  treat this as needed.  Diabetes mellitus type 2 - Insulin sliding scale Accu-Chek.  DVT prophylaxis: Pradaxa Code Status: Full code Family Communication: None Disposition Plan: To be determined Consults called: Spoke with Dr. Gardiner Rhyme from Cardiology. Will be available for consult via E Link if needed. Also Spoke with Dr Thereasa Solo.  Admission status: Admit to G [facility]   Time Spent: 65 minutes.  >50% of the time was devoted to discussing the patients care, assessment, plan and disposition with other care givers along with counseling the patient about the risks and benefits of treatment.  [name] Nadal Arsenio Loader MD [facility]  If 7PM-7AM, please contact night-coverage [email or web address]  01/31/2019, 7:00 PM

## 2019-01-31 NOTE — ED Notes (Signed)
Pt is aware urine specimen is needed. Pt has attempted to use the urinal, but has been unsuccessful to provide urine specimen.

## 2019-01-31 NOTE — ED Notes (Signed)
[facility] at bedside 

## 2019-01-31 NOTE — ED Provider Notes (Signed)
[facility] EMERGENCY DEPARTMENT Provider Note   CSN: [id number] Arrival date & time: 01/31/19  1232    History   Chief Complaint Chief Complaint  Patient presents with  . Dizziness    HPI [name] is a 63 y.o. male.     63yo male with complaint of dizziness and generalized weakness. 01/15/2019 had preprocedure COVID test that was positive. Only symptom/complaint has been generalized weakness, dizziness for the past week. Dizziness occurs when transitioning from sitting to standing, results in falls, no history of vertigo or dizziness previously. Denies nausea or vomiting. Dizziness described as "might pass out because the room is spinning." Denies CP, SHOB. Wounds to knees, lower legs from multiple falls due to dizziness.   History of prediabetes, takes medication for this. On Pradaxa. History of a-fib.  [name] was evaluated in Emergency Department on 01/31/2019 for the symptoms described in the history of present illness. He was evaluated in the context of the global COVID-19 pandemic, which necessitated consideration that the patient might be at risk for infection with the SARS-CoV-2 virus that causes COVID-19. Institutional protocols and algorithms that pertain to the evaluation of patients at risk for COVID-19 are in a state of rapid change based on information released by regulatory bodies including the CDC and federal and state organizations. These policies and algorithms were followed during the patient's care in the ED.      Past Medical History:  Diagnosis Date  . Allergy    SEASONAL  . Asthma   . Atrial fibrillation ([place])    persistant 02/2009  . Atrial flutter ([place])    s/p CTI ablation 08/06/10  . Cardiac arrhythmia due to congenital heart disease   . Cataract    BEGINING  . Chronic rhinitis   . Chronic systolic dysfunction of left ventricle   . Colon polyp   . Congestive heart failure ([place])   . Diverticulosis    colonoscopy  04/03/2009  . Hyperlipidemia   . Hypertension   . Hypertension   . Morbid obesity ([place])    target weight = 219  for BMI < 30  . Nonischemic cardiomyopathy ([facility])    tachycardia mediated  . Sleep apnea    original 2001    Patient Active Problem List   Diagnosis Date Noted  . Diabetic ulcer of calf associated with type 2 diabetes mellitus, with fat layer exposed ([place]) 05/20/2018  . Type 2 diabetes mellitus with hyperlipidemia ([place]) 09/09/2016  . History of colon polyps 05/17/2014  . Routine general medical examination at a health care facility 05/08/2014  . Atrial flutter ([place]) 07/12/2010  . Hypothyroidism 03/22/2010  . Chronic systolic heart failure ([place]) 03/28/2009  . ATRIAL FIBRILLATION 03/09/2009  . Asthma 01/25/2008  . Hyperlipidemia associated with type 2 diabetes mellitus ([place]) 01/24/2008  . MORBID OBESITY 01/24/2008  . Essential hypertension 01/24/2008  . Sleep apnea 01/24/2008    Past Surgical History:  Procedure Laterality Date  . ATRIAL FIBRILLATION ABLATION N/A 12/14/2018   Procedure: ATRIAL FIBRILLATION ABLATION;  Surgeon: Thompson Grayer, MD;  Location: [facility];  Service: Cardiovascular;  Laterality: N/A;  . atrial flutter ablation  08/06/10  . CARDIOVERSION N/A 07/05/2018   Procedure: CARDIOVERSION;  Surgeon: Buford Dresser, MD;  Location: [facility] END[facility]COPY;  Service: Cardiovascular;  Laterality: N/A;  . COLONOSCOPY    . TEE WITHOUT CARDIOVERSION N/A 12/14/2018   Procedure: TRANSESOPHAGEAL ECHOCARDIOGRAM (TEE);  Surgeon: Thompson Grayer, MD;  Location: [facility];  Service:  Cardiovascular;  Laterality: N/A;        Home Medications    Prior to Admission medications   Medication Sig Start Date End Date Taking? Authorizing Provider  albuterol (PROVENTIL HFA;VENTOLIN HFA) 108 (90 Base) MCG/ACT inhaler Inhale 2 puffs into the lungs every 6 (six) hours as needed for wheezing or shortness of breath.   Yes [provider]  amiodarone  (PACERONE) 200 MG tablet Take 1 tablet (200 mg total) by mouth daily. 07/13/18  Yes Sherran Needs, NP  carvedilol (COREG) 25 MG tablet Take 0.5 tablets (12.5 mg total) by mouth 2 (two) times daily with a meal. Patient taking differently: Take 25 mg by mouth daily.  07/13/18   Sherran Needs, NP  dabigatran (PRADAXA) 150 MG CAPS capsule Take 1 capsule (150 mg total) by mouth 2 (two) times daily. 12/15/18   Allred, Jeneen Rinks, MD  diphenhydramine-acetaminophen (TYLENOL PM) 25-500 MG TABS tablet Take 2 tablets by mouth at bedtime as needed (sleep).    [provider]  doxylamine, Sleep, (UNISOM) 25 MG tablet Take 50 mg by mouth at bedtime.    [provider]  furosemide (LASIX) 80 MG tablet TAKE 1/2-1 TABLET BY MOUTH DAILY AS NEEDED FOR FLUID OR EDEMA. Patient taking differently: Take 80 mg by mouth daily.  01/12/19   Baldwin Jamaica, PA-C  gabapentin (NEURONTIN) 300 MG capsule TAKE 1 CAPSULE BY MOUTH THREE TIMES A DAY Patient taking differently: Take 300 mg by mouth 2 (two) times daily.  08/13/18   [name], MD  glipiZIDE (GLUCOTROL) 5 MG tablet TAKE 1 TABLET BY MOUTH TWICE A DAY BEFORE MEALS/ NEED APPOINTMENT Patient taking differently: Take 5 mg by mouth 2 (two) times a day.  11/16/18   [name], MD  KLOR-CON M20 20 MEQ tablet TAKE 1 TABLET BY MOUTH TWICE A DAY Patient taking differently: Take 20 mEq by mouth 2 (two) times daily.  07/21/17   Baldwin Jamaica, PA-C  mometasone (NASONEX) 50 MCG/ACT nasal spray Place 2 sprays into the nose daily as needed (allergies or rhinitis).     [provider]  rosuvastatin (CRESTOR) 10 MG tablet TAKE 1 TABLET BY MOUTH EVERY DAY Patient taking differently: Take 10 mg by mouth at bedtime.  11/16/18   Allred, Jeneen Rinks, MD  sildenafil (VIAGRA) 100 MG tablet Take 1 tablet (100 mg total) by mouth as directed. Patient taking differently: Take 100 mg by mouth as needed for erectile dysfunction.  09/13/15   Allred, Jeneen Rinks, MD   SYMBICORT 80-4.5 MCG/ACT inhaler USE 2 INHALATIONS TWICE A DAY Patient taking differently: Inhale 2 puffs into the lungs 2 (two) times daily as needed (shortness of breath).  02/12/17   [name], MD  valsartan (DIOVAN) 320 MG tablet Take 1 tablet (320 mg total) by mouth daily. 12/15/18   Thompson Grayer, MD    Family History Family History  Problem Relation Age of Onset  . Hypertension Mother   . Kidney failure Mother        Due to sepsis  . Colon cancer Father 61  . COPD Father   . Colon cancer Brother   . Colon polyps Neg Hx   . Diabetes Neg Hx   . Kidney disease Neg Hx   . Esophageal cancer Neg Hx     Social History Social History   Tobacco Use  . Smoking status: Never Smoker  . Smokeless tobacco: Never Used  Substance Use Topics  . Alcohol use: Yes  Alcohol/week: 0.0 standard drinks    Comment: previously quite heavy, working on cessation   . Drug use: No     Allergies   Patient has no known allergies.   Review of Systems Review of Systems  Constitutional: Negative for chills, diaphoresis and fever.  Respiratory: Negative for shortness of breath.   Cardiovascular: Negative for chest pain.  Gastrointestinal: Negative for abdominal pain, constipation, diarrhea, nausea and vomiting.  Musculoskeletal: Negative for arthralgias and myalgias.  Skin: Positive for wound.  Neurological: Positive for dizziness and weakness.  Hematological: Bruises/bleeds easily.  Psychiatric/Behavioral: Negative for confusion.  All other systems reviewed and are negative.    Physical Exam Updated Vital Signs BP 113/74   Pulse (!) 45   Temp (!) 97.2 F (36.2 C) (Oral)   Resp 18   SpO2 98%   Physical Exam Vitals signs and nursing note reviewed.  Constitutional:      General: He is not in acute distress.    Appearance: He is well-developed. He is not diaphoretic.  HENT:     Head: Normocephalic and atraumatic.     Mouth/Throat:     Mouth: Mucous membranes are dry.   Eyes:     Extraocular Movements: Extraocular movements intact.     Pupils: Pupils are equal, round, and reactive to light.  Cardiovascular:     Rate and Rhythm: Regular rhythm. Bradycardia present.     Heart sounds: Normal heart sounds.  Pulmonary:     Effort: Pulmonary effort is normal.     Breath sounds: Normal breath sounds.  Abdominal:     Tenderness: There is no abdominal tenderness.  Musculoskeletal:        General: Signs of injury present. No tenderness.     Right lower leg: No edema.     Left lower leg: No edema.     Comments: Minor abrasions to bilateral knees. Healing wounds to anterior left lower leg. Multiple layers of bandage removed from right lower leg- posterior right lower leg wound without evidence of infection.   Skin:    General: Skin is warm and dry.  Neurological:     General: No focal deficit present.     Mental Status: He is alert and oriented to person, place, and time.     GCS: GCS eye subscore is 4. GCS verbal subscore is 5. GCS motor subscore is 6.     Cranial Nerves: Cranial nerves are intact. No cranial nerve deficit.     Sensory: No sensory deficit.     Motor: No weakness.  Psychiatric:        Behavior: Behavior normal.      ED Treatments / Results  Labs (all labs ordered are listed, but only abnormal results are displayed) Labs Reviewed  SARS CORONAVIRUS 2 (HOSPITAL ORDER, [facility]) - Abnormal; Notable for the following components:      Result Value   SARS Coronavirus 2 POSITIVE (*)    All other components within normal limits  BASIC METABOLIC PANEL - Abnormal; Notable for the following components:   Potassium 6.2 (*)    CO2 18 (*)    BUN 85 (*)    Creatinine, Ser 3.92 (*)    Calcium 8.7 (*)    GFR calc non Af Amer 15 (*)    GFR calc Af Amer 18 (*)    All other components within normal limits  CBC - Abnormal; Notable for the following components:   RDW 16.1 (*)  All other components within normal limits   CBG MONITORING, ED - Abnormal; Notable for the following components:   Glucose-Capillary 49 (*)    All other components within normal limits  CBG MONITORING, ED - Abnormal; Notable for the following components:   Glucose-Capillary 46 (*)    All other components within normal limits  CBG MONITORING, ED - Abnormal; Notable for the following components:   Glucose-Capillary 54 (*)    All other components within normal limits  CBG MONITORING, ED - Abnormal; Notable for the following components:   Glucose-Capillary 106 (*)    All other components within normal limits  URINALYSIS, ROUTINE W REFLEX MICROSCOPIC  CBG MONITORING, ED  CBG MONITORING, ED    EKG EKG Interpretation   Date/Time:  Monday January 31 2019 13:01:42 EDT Ventricular Rate:  48 PR Interval:  254 QRS Duration: 172 QT Interval:  538 QTC Calculation: 480 R Axis:   -72 Text Interpretation:  Marked sinus bradycardia with 1st degree A-V block Right bundle branch block Left anterior fascicular block Bifascicular block Abnormal ECG ST depression more prounouced. ischemic appearance Confirmed by Charlesetta Shanks [phone]) on 01/31/2019 1:24:55 PM Radiology Ct Head Wo Contrast  Result Date: 01/31/2019 CLINICAL DATA:  63 year old male with vertigo, dizziness and multiple falls. EXAM: CT HEAD WITHOUT CONTRAST TECHNIQUE: Contiguous axial images were obtained from the base of the skull through the vertex without intravenous contrast. COMPARISON:  None. FINDINGS: Brain: No evidence of acute infarction, hemorrhage, hydrocephalus, extra-axial collection or mass lesion/mass effect. Atrophy and mild to moderate white matter hypodensities are noted, likely representing chronic small-vessel white matter ischemic changes. Vascular: Carotid and vertebral atherosclerotic calcifications are noted. Skull: Normal. Negative for fracture or focal lesion. Sinuses/Orbits: A small amount of fluid and mucosal thickening within both maxillary sinuses noted.  Other: None IMPRESSION: 1. No evidence of acute intracranial abnormality. 2. Atrophy and mild to moderate probable chronic small-vessel white matter ischemic changes. 3. Small amount of fluid and mucosal thickening within both maxillary sinuses suggesting acute on chronic sinusitis. Electronically Signed   By: Margarette Canada M.D.   On: 01/31/2019 17:27   Dg Chest Port 1 View  Result Date: 01/31/2019 CLINICAL DATA:  Weakness EXAM: PORTABLE CHEST 1 VIEW COMPARISON:  08/10/2010 FINDINGS: Heart size is enlarged. Aortic calcifications are noted. There is scattered mild airspace opacities bilaterally, for example in the right upper lobe and at the left lung base. There is no pneumothorax. No large pleural effusion. There is no acute osseous abnormality. IMPRESSION: 1. Scattered airspace opacities bilaterally. These are nonspecific but can be seen in patients with an atypical infectious process such as viral pneumonia. Follow-up to radiologic resolution is recommended. Consider a 4-6 week follow-up two-view chest x-ray to document stability or resolution of these findings. 2. Stable cardiac enlargement. Electronically Signed   By: Constance Holster M.D.   On: 01/31/2019 15:12    Procedures .Critical Care Performed by: Tacy Learn, PA-C Authorized by: Tacy Learn, PA-C   Critical care provider statement:    Critical care time (minutes):  45   Critical care was time spent personally by me on the following activities:  Discussions with consultants, evaluation of patient's response to treatment, examination of patient, ordering and performing treatments and interventions, ordering and review of laboratory studies, ordering and review of radiographic studies, pulse oximetry, re-evaluation of patient's condition, obtaining history from patient or surrogate and review of old charts   (including critical care time)  Medications Ordered in ED Medications  sodium chloride flush (NS) 0.9 % injection 3 mL (3  mLs Intravenous Given 01/31/19 1458)  sodium zirconium cyclosilicate (LOKELMA) packet 10 g (10 g Oral Given 01/31/19 1459)  sodium chloride 0.9 % bolus 500 mL (500 mLs Intravenous New Bag/Given 01/31/19 1458)  dextrose 50 % solution 50 mL (50 mLs Intravenous Given 01/31/19 1606)     Initial Impression / Assessment and Plan / ED Course  I have reviewed the triage vital signs and the nursing notes.  Pertinent labs & imaging results that were available during my care of the patient were reviewed by me and considered in my medical decision making (see chart for details).  Clinical Course as of Jan 31 1836  Mon Jan 30, 8814  415 63 year old male on anticoagulation here with weakness dizziness falls some increased shortness of breath.  He is got a worsening of his chronic AKI along with now hyperkalemia.  He is now requiring any supplemental oxygen.   [MB]  2267 63 year old male presents with complaint generalized weakness and dizziness with changes in position for the past week, resulting in falls with minor abrasions to his knees and superficial wounds of the lower legs.  Patient was scheduled for a cardiac cath on 8/5, had a positive screening COVID test on 01/15/2019 and cath was canceled. Patient has not had any symptoms or complaints until his weakness and dizziness this past week.  Review of labs, patient is COVID positive today, BMP with hyperkalemia with potassium 6.2, treated with low,.  Concern for AKI with creatinine of 3.92, previously 1.77.  CBC unremarkable, urinalysis unremarkable.  Patient had drop in his blood sugar into the 40s, was given orange juice with sugar with mild improvement and then given amp of D50 with repeat blood glucose of 106.  Chest x-ray with bilateral patchy infiltrates consistent with COVID.  CT head shows acute on chronic maxillary sinus disease.  Case discussed with hospitalist who will consult for admission.   [LM]    Clinical Course User Index [LM] Tacy Learn, PA-C [MB] Hayden Rasmussen, MD      Final Clinical Impressions(s) / ED Diagnoses   Final diagnoses:  AKI (acute kidney injury) [facility])  Hyperkalemia  Hypoglycemia  Dizziness  COVID-19    ED Discharge Orders    None       Roque Lias 01/31/19 Ewell Poe, MD 02/01/19 0930

## 2019-01-31 NOTE — ED Notes (Addendum)
RN Myriam Jacobson was notified of pt's blow blood sugar

## 2019-02-01 ENCOUNTER — Other Ambulatory Visit: Payer: Self-pay

## 2019-02-01 LAB — POTASSIUM
Potassium: 6.5 mmol/L (ref 3.5–5.1)
Potassium: 6.1 mmol/L — ABNORMAL HIGH (ref 3.5–5.1)
Potassium: 5.6 mmol/L — ABNORMAL HIGH (ref 3.5–5.1)

## 2019-02-01 LAB — CBC
HCT: 49.2 % (ref 39.0–52.0)
Hemoglobin: 15.4 g/dL (ref 13.0–17.0)
MCH: 28.3 pg (ref 26.0–34.0)
MCHC: 31.3 g/dL (ref 30.0–36.0)
MCV: 90.3 fL (ref 80.0–100.0)
Platelets: 293 10*3/uL (ref 150–400)
RBC: 5.45 MIL/uL (ref 4.22–5.81)
RDW: 16.1 % — ABNORMAL HIGH (ref 11.5–15.5)
WBC: 4.5 10*3/uL (ref 4.0–10.5)
nRBC: 0 % (ref 0.0–0.2)

## 2019-02-01 LAB — GLUCOSE, CAPILLARY
Glucose-Capillary: 226 mg/dL — ABNORMAL HIGH (ref 70–99)
Glucose-Capillary: 294 mg/dL — ABNORMAL HIGH (ref 70–99)
Glucose-Capillary: 341 mg/dL — ABNORMAL HIGH (ref 70–99)
Glucose-Capillary: 115 mg/dL — ABNORMAL HIGH (ref 70–99)
Glucose-Capillary: 321 mg/dL — ABNORMAL HIGH (ref 70–99)
Glucose-Capillary: 300 mg/dL — ABNORMAL HIGH (ref 70–99)

## 2019-02-01 LAB — NA AND K (SODIUM & POTASSIUM), RAND UR
Potassium Urine: 18 mmol/L
Sodium, Ur: 91 mmol/L

## 2019-02-01 LAB — BASIC METABOLIC PANEL
Anion gap: 6 (ref 5–15)
BUN: 82 mg/dL — ABNORMAL HIGH (ref 8–23)
CO2: 24 mmol/L (ref 22–32)
Calcium: 8.9 mg/dL (ref 8.9–10.3)
Chloride: 105 mmol/L (ref 98–111)
Creatinine, Ser: 3.08 mg/dL — ABNORMAL HIGH (ref 0.61–1.24)
GFR calc Af Amer: 24 mL/min — ABNORMAL LOW (ref >60)
GFR calc non Af Amer: 20 mL/min — ABNORMAL LOW (ref >60)
Glucose, Bld: 222 mg/dL — ABNORMAL HIGH (ref 70–99)
Potassium: >7.5 mmol/L (ref 3.5–5.1)
Sodium: 135 mmol/L (ref 135–145)

## 2019-02-01 LAB — MAGNESIUM: Magnesium: 3 mg/dL — ABNORMAL HIGH (ref 1.7–2.4)

## 2019-02-01 LAB — C-REACTIVE PROTEIN: CRP: 7.6 mg/dL — ABNORMAL HIGH (ref <1.0)

## 2019-02-01 LAB — BRAIN NATRIURETIC PEPTIDE: B Natriuretic Peptide: 221.7 pg/mL — ABNORMAL HIGH (ref 0.0–100.0)

## 2019-02-01 LAB — FERRITIN: Ferritin: 889 ng/mL — ABNORMAL HIGH (ref 24–336)

## 2019-02-01 LAB — HIV ANTIBODY (ROUTINE TESTING W REFLEX): HIV Screen 4th Generation wRfx: NONREACTIVE

## 2019-02-01 MED ORDER — DEXTROSE 50 % IV SOLN
1.0000 | Freq: Once | INTRAVENOUS | Status: AC
  Administered 2019-02-01: 50 mL via INTRAVENOUS

## 2019-02-01 MED ORDER — METOPROLOL TARTRATE 5 MG/5ML IV SOLN
5.0000 mg | Freq: Four times a day (QID) | INTRAVENOUS | Status: DC | PRN
  Filled 2019-02-01: qty 5

## 2019-02-01 MED ORDER — CALCIUM GLUCONATE 10 % IV SOLN: 1.0000 g | Freq: Once | INTRAVENOUS | Status: DC

## 2019-02-01 MED ORDER — ALBUTEROL SULFATE HFA 108 (90 BASE) MCG/ACT IN AERS
4.0000 | INHALATION_SPRAY | Freq: Once | RESPIRATORY_TRACT | Status: AC
  Administered 2019-02-01: 4 via RESPIRATORY_TRACT
  Filled 2019-02-01: qty 6.7

## 2019-02-01 MED ORDER — ALBUTEROL SULFATE (2.5 MG/3ML) 0.083% IN NEBU: 10.0000 mg | INHALATION_SOLUTION | Freq: Once | RESPIRATORY_TRACT | Status: DC

## 2019-02-01 MED ORDER — INSULIN ASPART 100 UNIT/ML IV SOLN
10.0000 [IU] | Freq: Once | INTRAVENOUS | Status: AC
  Administered 2019-02-01: 10 [IU] via INTRAVENOUS

## 2019-02-01 MED ORDER — SODIUM POLYSTYRENE SULFONATE 15 GM/60ML PO SUSP
30.0000 g | Freq: Once | ORAL | Status: AC
  Administered 2019-02-01: 30 g via ORAL
  Filled 2019-02-01: qty 120

## 2019-02-01 MED ORDER — ISOSORBIDE MONONITRATE ER 30 MG PO TB24
30.0000 mg | ORAL_TABLET | Freq: Every day | ORAL | Status: DC
  Administered 2019-02-01 – 2019-02-03 (×3): 30 mg via ORAL
  Filled 2019-02-01 (×4): qty 1

## 2019-02-01 MED ORDER — ALBUTEROL SULFATE HFA 108 (90 BASE) MCG/ACT IN AERS
2.0000 | INHALATION_SPRAY | Freq: Four times a day (QID) | RESPIRATORY_TRACT | Status: DC | PRN
  Administered 2019-02-02: 2 via RESPIRATORY_TRACT

## 2019-02-01 MED ORDER — FUROSEMIDE 10 MG/ML IJ SOLN
40.0000 mg | Freq: Three times a day (TID) | INTRAMUSCULAR | Status: AC
  Administered 2019-02-01 (×2): 40 mg via INTRAVENOUS
  Filled 2019-02-01 (×2): qty 4

## 2019-02-01 MED ORDER — DEXTROSE 50 % IV SOLN
INTRAVENOUS | Status: AC
  Filled 2019-02-01: qty 50

## 2019-02-01 MED ORDER — HYDRALAZINE HCL 50 MG PO TABS
50.0000 mg | ORAL_TABLET | Freq: Three times a day (TID) | ORAL | Status: DC
  Administered 2019-02-01 – 2019-02-05 (×11): 50 mg via ORAL
  Filled 2019-02-01 (×11): qty 1

## 2019-02-01 MED ORDER — AMIODARONE HCL 100 MG PO TABS
200.0000 mg | ORAL_TABLET | Freq: Every day | ORAL | Status: DC
  Administered 2019-02-02 – 2019-02-05 (×4): 200 mg via ORAL
  Filled 2019-02-01 (×2): qty 2
  Filled 2019-02-01: qty 1
  Filled 2019-02-01: qty 2
  Filled 2019-02-01: qty 1

## 2019-02-01 MED ORDER — LACTATED RINGERS IV SOLN
INTRAVENOUS | Status: AC
  Administered 2019-02-01: 08:00:00 via INTRAVENOUS

## 2019-02-01 MED ORDER — LACTATED RINGERS IV SOLN
INTRAVENOUS | Status: DC
  Administered 2019-02-01: 11:00:00 via INTRAVENOUS

## 2019-02-01 MED ORDER — SODIUM BICARBONATE 8.4 % IV SOLN: 50.0000 meq | Freq: Once | INTRAVENOUS | Status: DC

## 2019-02-01 MED ORDER — AMLODIPINE BESYLATE 5 MG PO TABS
10.0000 mg | ORAL_TABLET | Freq: Every day | ORAL | Status: DC
  Administered 2019-02-01 – 2019-02-04 (×4): 10 mg via ORAL
  Filled 2019-02-01 (×4): qty 2

## 2019-02-01 MED ORDER — CALCIUM GLUCONATE-NACL 1-0.675 GM/50ML-% IV SOLN
1.0000 g | Freq: Once | INTRAVENOUS | Status: AC
  Administered 2019-02-01: 1000 mg via INTRAVENOUS

## 2019-02-01 MED ORDER — CALCIUM GLUCONATE-NACL 1-0.675 GM/50ML-% IV SOLN
INTRAVENOUS | Status: AC
  Filled 2019-02-01: qty 50

## 2019-02-01 MED ORDER — SODIUM ZIRCONIUM CYCLOSILICATE 10 G PO PACK
10.0000 g | PACK | Freq: Three times a day (TID) | ORAL | Status: AC
  Administered 2019-02-01 (×3): 10 g via ORAL
  Filled 2019-02-01 (×3): qty 1

## 2019-02-01 NOTE — Progress Notes (Signed)
PT Cancellation Note  Patient Details Name: [name] MRN: [id number] DOB: [birth year]   Cancelled Treatment:    Reason Eval/Treat Not Completed: Fatigue/lethargy limiting ability to participate, pt had returned to bed and was comfortable and tired. Will check back tomorrow.    Claretha Cooper 02/01/2019, 5:31 PM

## 2019-02-01 NOTE — Evaluation (Signed)
Occupational Therapy Evaluation Patient Details Name: [name] MRN: [id number] DOB: 1956/03/22 Today's Date: 02/01/2019    History of Present Illness 37 male presenting with complains of dizziness and weakness for the past 2 weeks. Tested COVID positive. PMH including a-fib/flutter flutter status post cardioversion on Pradaxa, systolic CHF, HTN, DM type 2, hyperlipidemia, and peripheral neuropathy.   Clinical Impression   Upon arrival, pt sitting at EOB and reports he needs to use bathroom. Pt currently requiring Min A for LB ADLs, toileting, and functional mobility with RW. PTA, pt was living at home alone and was independent with ADLs, IADLs, and driving; pt reporting several falls recently due to BLE weakness. Pt presenting with poor balance, strength, and activity tolerance. HR 60s. RR 28. SpO2 dropping to 70s on RA with monitor on finger; transferred monitor on earlobe and SpO2 reading 90s on RA. Pt denies SOB. Pt would benefit from further acute OT to facilitate safe dc. Recommend dc to home with HHOT for further OT to optimize safety, independence with ADLs, and return to PLOF.      Follow Up Recommendations  Home health OT    Equipment Recommendations  3 in 1 bedside commode    Recommendations for Other Services PT consult     Precautions / Restrictions Precautions Precautions: Fall Precaution Comments: Pt reporting 3-4 falls in the last few weeks      Mobility Bed Mobility               General bed mobility comments: Pt sitting at EOB upon arrival  Transfers Overall transfer level: Needs assistance Equipment used: Rolling walker (2 wheeled) Transfers: Sit to/from Stand Sit to Stand: Min assist;Min guard         General transfer comment: Providing education on hand placement for RW management. Min A for initial sit<>Stand and gaining balance. Following transfers, pt requires Min Guard A for safety    Balance Overall balance assessment: Needs  assistance Sitting-balance support: No upper extremity supported;Feet supported Sitting balance-Leahy Scale: Good     Standing balance support: Bilateral upper extremity supported;No upper extremity supported;During functional activity Standing balance-Leahy Scale: Poor Standing balance comment: Reliant on UE support                           ADL either performed or assessed with clinical judgement   ADL Overall ADL's : Needs assistance/impaired Eating/Feeding: Independent;Sitting   Grooming: Wash/dry hands;Min guard;Standing Grooming Details (indicate cue type and reason): Min Guard A for safety. Pt leaning on his elbows at sink for support and stability Upper Body Bathing: Set up;Sitting   Lower Body Bathing: Minimal assistance;Sit to/from stand   Upper Body Dressing : Set up;Supervision/safety;Sitting   Lower Body Dressing: Minimal assistance;Sit to/from stand Lower Body Dressing Details (indicate cue type and reason): Pt adjusting socks while seated in recliner. Min A for dynamic standing balance Toilet Transfer: Ambulation;RW;Regular Toilet;Min Psychiatric nurse Details (indicate cue type and reason): Min Guard A for safety during transfer to regular toilet Toileting- Clothing Manipulation and Hygiene: Minimal assistance;Sit to/from stand Toileting - Clothing Manipulation Details (indicate cue type and reason): Min A for balance and to pull underwear over hips.     Functional mobility during ADLs: Minimal assistance;Rolling walker General ADL Comments: Pt presenting with poor balance, strength, and activity tolerance. Reporting several falls while at home.      Vision Baseline Vision/History: Wears glasses Patient Visual Report: No change from baseline  Perception     Praxis      Pertinent Vitals/Pain Pain Assessment: Faces Faces Pain Scale: Hurts a little bit Pain Location: Generalized Pain Descriptors / Indicators: Discomfort;Grimacing Pain  Intervention(s): Monitored during session;Repositioned     Hand Dominance Right   Extremity/Trunk Assessment Upper Extremity Assessment Upper Extremity Assessment: Overall WFL for tasks assessed   Lower Extremity Assessment Lower Extremity Assessment: Defer to PT evaluation;RLE deficits/detail RLE Deficits / Details: Pt reporting prior surgeries on RLE; presenting with external rotation of right foot    Cervical / Trunk Assessment Cervical / Trunk Assessment: Normal   Communication Communication Communication: No difficulties   Cognition Arousal/Alertness: Awake/alert Behavior During Therapy: WFL for tasks assessed/performed Overall Cognitive Status: Within Functional Limits for tasks assessed                                     General Comments  HR 64. RR 28. SpO2 100% on RA with monitor on ear.     Exercises     Shoulder Instructions      Home Living Family/patient expects to be discharged to:: Private residence Living Arrangements: Alone   Type of Home: House Home Access: Stairs to enter [employer] of Steps: 2 Entrance Stairs-Rails: None Home Layout: Two level;Able to live on main level with bedroom/bathroom     Bathroom Shower/Tub: Occupational psychologist: Standard     Home Equipment: None          Prior Functioning/Environment Level of Independence: Independent        Comments: ADLs, IADLs, driving        OT Problem List: Decreased strength;Decreased range of motion;Decreased activity tolerance;Impaired balance (sitting and/or standing);Decreased knowledge of use of DME or AE;Decreased knowledge of precautions      OT Treatment/Interventions: Self-care/ADL training;Therapeutic exercise;Energy conservation;DME and/or AE instruction;Therapeutic activities;Patient/family education;Balance training    OT Goals(Current goals can be found in the care plan section) Acute Rehab OT Goals Patient Stated Goal: "Get  better and go home" OT Goal Formulation: With patient Time For Goal Achievement: 02/15/19 Potential to Achieve Goals: Good  OT Frequency: Min 3X/week   Barriers to D/C:            Co-evaluation              AM-PAC OT "6 Clicks" Daily Activity     Outcome Measure Help from another person eating meals?: None Help from another person taking care of personal grooming?: A Little Help from another person toileting, which includes using toliet, bedpan, or urinal?: A Little Help from another person bathing (including washing, rinsing, drying)?: A Little Help from another person to put on and taking off regular upper body clothing?: None Help from another person to put on and taking off regular lower body clothing?: A Little 6 Click Score: 20   End of Session Equipment Utilized During Treatment: Rolling walker Nurse Communication: Mobility status  Activity Tolerance: Patient tolerated treatment well Patient left: in chair;with call bell/phone within reach(with Lab)  OT Visit Diagnosis: Muscle weakness (generalized) (M62.81);History of falling (Z91.81);Repeated falls (R29.6);Unsteadiness on feet (R26.81);Other abnormalities of gait and mobility (R26.89)                Time: 3818-2993 OT Time Calculation (min): 26 min Charges:  OT General Charges $OT Visit: 1 Visit OT Evaluation $OT Eval Low Complexity: 1 Low OT Treatments $Self Care/Home Management : 8-22  mins  Sumer Moorehouse MSOT, OTR/L Acute Rehab Pager: [phone] Office: [place] 02/01/2019, 10:02 AM

## 2019-02-01 NOTE — Progress Notes (Signed)
PROGRESS NOTE                                                                                                                                                                                                             Patient Demographics:    [name], is a 63 y.o. male, DOB - 1956/05/23, QJJ:[id number]  Outpatient Primary MD for the patient is [name], MD    LOS - 1  Admit date - 01/31/2019    Chief Complaint  Patient presents with  . Dizziness       Brief Narrative  [name] is a 63 y.o. male with medical history significant of atrial fibrillation/flutter flutter status post cardioversion on Pradaxa, systolic CHF ejection fraction 30%, essential hypertension, diabetes mellitus type 2, hyperlipidemia, peripheral neuropathy came to the hospital with complains of dizziness and weakness for the past 2 weeks which is worsened over the last week.  In the ER he was found to be bradycardic, hyperkalemic, in AKI and was incidentally COVID-19 positive.  No pulmonary symptoms.   Subjective:    [name] today has, No headache, No chest pain, No abdominal pain - No Nausea, No new weakness tingling or numbness, no shortness of breath.  No weakness at this time.   Assessment  & Plan :     1. Mild Covid 19 Viral Pneumonitis during the ongoing 2020 Covid 19 Pandemic - he is relatively symptom-free, afebrile no cough or shortness of breath monitor inflammatory markers closely.  For now no intervention.  COVID-19 Labs  Recent Labs    01/31/19 2234 02/01/19 0600  FERRITIN 1,107* 889*  LDH 302*  --   CRP 9.6* 7.6*    Lab Results  Component Value Date   SARSCOV2NAA POSITIVE (A) 01/31/2019   SARSCOV2NAA POSITIVE (A) 01/15/2019   [place] NEGATIVE 12/10/2018     Hepatic Function Latest Ref Rng & Units 05/03/2018 11/26/2017 09/09/2016  Total Protein 6.0 - 8.5 g/dL 7.2 7.4 7.4  Albumin 3.6 - 4.8  g/dL 4.2 4.2 4.3  AST 0 - 40 IU/L _0 ALT 0 - 44 IU/L 16 35 20  Alk Phosphatase 39 - 117 IU/L 69 56 44  Total Bilirubin 0.0 - 1.2 mg/dL 1.0 0.9 1.0  Bilirubin, Direct 0.00 - 0.40 mg/dL 0.27 - -  Component Value Date/Time   BNP 221.7 (H) 02/01/2019 0600      2.  Symptomatic bradycardia with heart rate in mid 40s - multi factorial likely due to combination of hyperkalemia, high-dose beta-blocker and elevated TSH.  Hyperkalemia has been corrected, beta-blocker held, heart rate is better.  His TSH is hard to interpret due to him being on amiodarone.  Will repeat TSH levels along with free T3 and T4 and monitor.  3.  Life-threatening hyperkalemia.  Held ARB, Aldactone and potassium supplementation.  Hyperkalemia protocol has been implemented including IV Calcium gluconate, IV Lasix and D50, sodium bicarbonate, IV fluids and IV Lasix combination, oral Kayexalate and Lokelma, bradycardia has resolved, no tenting of T waves, will monitor potassium levels closely throughout the day.  4.  Chronic systolic heart failure last EF 35% on echocardiogram done in 12/04/2018.  Currently compensated.  ARB and beta-blocker held due to hyperkalemia and bradycardia, amiodarone skipped today also due to bradycardia.  Currently compensated will monitor closely post discharge follow-up with Dr. Rayann Heman.  5.  Weakness.  Due to #2  above.  Treatment as #2 above along with PT eval.  6.  Dyslipidemia.  On statin.  7.  AKI on CKD 3 due to combination of dehydration and ARB.  Offending medications held gentle hydration and monitor.  Baseline creatinine is around 1.7.   8.  Paroxysmal atrial fibrillation/atrial flutter.  Mali vas 2 score of greater than 3.  On Pradaxa, rate controlling medications held due to #2 above.  PRN IV Lopressor ordered.  9.  CAD with underlying ischemic cardiomyopathy.  No acute issues.  Statin continued for secondary prevention.  Beta-blocker will be resumed once heart rate is  stabilized.  10.  Essential hypertension will be placed on hydralazine and Norvasc combination along with Imdur and monitor.  11.  DM type II.  For now sliding scale.  A1c was stable.  Lab Results  Component Value Date   HGBA1C 6.7 (H) 01/31/2019     Condition -   Guarded  Family Communication  : None  Code Status :  Full  Diet :   Diet Order            Diet Heart Room service appropriate? Yes; Fluid consistency: Thin  Diet effective now               Disposition Plan  :  Home 2-3 days  Consults  :  Cards by admitting MD  Procedures  :    TTE - 11/2018 -   1. The left ventricle has moderate-severely reduced systolic function, with an ejection fraction of 30-35%. The cavity size was moderately dilated. Left ventrical global hypokinesis without regional wall motion abnormalities.  2. The right ventricle has normal systolc function. The cavity was normal. There is no increase in right ventricular wall thickness.  3. Left atrial size was severely dilated.  4. Dense smoke in atria and appendage. Stagnant pre thrombus and likely thrombus in mid to distal LAA High risk for embolic event EP procedure cancelled.  5. Right atrial size was moderately dilated.  6. The mitral valve is grossly normal. Mild thickening of the mitral valve leaflet. Mild calcification of the mitral valve leaflet. Mitral valve regurgitation is mild to moderate by color flow Doppler.  7. Restricted posterior leaflet motion.  8. The aortic valve is tricuspid Moderate thickening of the aortic valve Sclerosis without any evidence of stenosis of the aortic valve. Aortic valve regurgitation is trivial by color flow  Doppler.  9. The aortic root is normal in size and structure.    PUD Prophylaxis : None  DVT Prophylaxis  :  Pradaxa  Lab Results  Component Value Date   PLT 293 02/01/2019    Inpatient Medications  Scheduled Meds: . [START ON 02/02/2019] amiodarone  200 mg Oral Daily  . dabigatran  150  mg Oral BID  . dextrose      . furosemide  40 mg Intravenous Q8H  . gabapentin  300 mg Oral BID  . insulin aspart  0-9 Units Subcutaneous TID WC  . mometasone-formoterol  2 puff Inhalation BID  . rosuvastatin  10 mg Oral QHS  . sodium zirconium cyclosilicate  10 g Oral TID   Continuous Infusions: . calcium gluconate in NaCl    . lactated ringers 500 mL/hr at 02/01/19 0746   PRN Meds:.acetaminophen **OR** [DISCONTINUED] acetaminophen, alum & mag hydroxide-simeth, guaiFENesin-dextromethorphan, hydrALAZINE, hydrocortisone, hydrocortisone cream, lip balm, loratadine, metoprolol tartrate, Muscle Rub, [DISCONTINUED] ondansetron **OR** ondansetron (ZOFRAN) IV, phenol, polyethylene glycol, polyvinyl alcohol, senna-docusate, sodium chloride  Antibiotics  :    Anti-infectives (From admission, onward)   None       Time Spent in minutes  30   Lala Lund M.D on 02/01/2019 at 9:59 AM  To page go to [email or web address] - password [facility]  [facility] -  Office  [phone]    See all Orders from today for further details    Objective:   Vitals:   02/01/19 0000 02/01/19 0112 02/01/19 0500 02/01/19 0750  BP:   133/84 (!) 154/100  Pulse:   (!) 50 (!) 53  Resp:   19 18  Temp:   97.8 F (36.6 C) 97.7 F (36.5 C)  TempSrc:   Oral Oral  SpO2:  100% 97% 100%  Weight: 129.3 kg       Wt Readings from Last 3 Encounters:  02/01/19 129.3 kg  01/11/19 129.3 kg  12/28/18 125.4 kg     Intake/Output Summary (Last 24 hours) at 02/01/2019 0959 Last data filed at 02/01/2019 0746 Gross per 24 hour  Intake 740 ml  Output 375 ml  Net 365 ml     Physical Exam  Awake Alert, Oriented X 3, No new F.N deficits, Normal affect .AT,PERRAL Supple Neck,No JVD, No cervical lymphadenopathy appriciated.  Symmetrical Chest wall movement, Good air movement bilaterally, CTAB RRR,No Gallops,Rubs or new Murmurs, No Parasternal Heave +ve B.Sounds, Abd Soft, No tenderness, No organomegaly  appriciated, No rebound - guarding or rigidity. No Cyanosis, Clubbing or edema, No new Rash or bruise       Data Review:    CBC Recent Labs  Lab 01/31/19 1326 02/01/19 0600  WBC 5.6 4.5  HGB 15.7 15.4  HCT 50.3 49.2  PLT 297 293  MCV 91.5 90.3  MCH 28.5 28.3  MCHC 31.2 31.3  RDW 16.1* 16.1*    Chemistries  Recent Labs  Lab 01/31/19 1326 01/31/19 2234 02/01/19 0600  NA 135 133* 135  K 6.2* 6.0* >7.5*  CL 106 104 105  CO2 18* 20* 24  GLUCOSE 82 117* 222*  BUN 85* 80* 82*  CREATININE 3.92* 3.49* 3.08*  CALCIUM 8.7* 8.7* 8.9  MG  --   --  3.0*   ------------------------------------------------------------------------------------------------------------------ No results for input(s): CHOL, HDL, LDLCALC, TRIG, CHOLHDL, LDLDIRECT in the last 72 hours.  Lab Results  Component Value Date   HGBA1C 6.7 (H) 01/31/2019   ------------------------------------------------------------------------------------------------------------------ Recent Labs    01/31/19 2234  TSH 25.834*  Cardiac Enzymes No results for input(s): CKMB, TROPONINI, MYOGLOBIN in the last 168 hours.  Invalid input(s): CK ------------------------------------------------------------------------------------------------------------------    Component Value Date/Time   BNP 221.7 (H) 02/01/2019 0600    Micro Results Recent Results (from the past 240 hour(s))  SARS Coronavirus 2 [facility] order, Performed in [facility] hospital lab) Nasopharyngeal Nasopharyngeal Swab     Status: Abnormal   Collection Time: 01/31/19  2:49 PM   Specimen: Nasopharyngeal Swab  Result Value Ref Range Status   SARS Coronavirus 2 POSITIVE (A) NEGATIVE Final    Comment: RESULT CALLED TO, READ BACK BY AND VERIFIED WITHRobynn Pane RN 1556 01/31/19 A BROWNING (NOTE) If result is NEGATIVE SARS-CoV-2 target nucleic acids are NOT DETECTED. The SARS-CoV-2 RNA is generally detectable in upper and lower  respiratory specimens  during the acute phase of infection. The lowest  concentration of SARS-CoV-2 viral copies this assay can detect is 250  copies / mL. A negative result does not preclude SARS-CoV-2 infection  and should not be used as the sole basis for treatment or other  patient management decisions.  A negative result may occur with  improper specimen collection / handling, submission of specimen other  than nasopharyngeal swab, presence of viral mutation(s) within the  areas targeted by this assay, and inadequate number of viral copies  (<250 copies / mL). A negative result must be combined with clinical  observations, patient history, and epidemiological information. If result is POSITIVE SARS-CoV-2 target nucleic acids are DETECTED.  The SARS-CoV-2 RNA is generally detectable in upper and lower  respiratory specimens during the acute phase of infection.  Positive  results are indicative of active infection with SARS-CoV-2.  Clinical  correlation with patient history and other diagnostic information is  necessary to determine patient infection status.  Positive results do  not rule out bacterial infection or co-infection with other viruses. If result is PRESUMPTIVE POSTIVE SARS-CoV-2 nucleic acids MAY BE PRESENT.   A presumptive positive result was obtained on the submitted specimen  and confirmed on repeat testing.  While 2019 novel coronavirus  (SARS-CoV-2) nucleic acids may be present in the submitted sample  additional confirmatory testing may be necessary for epidemiological  and / or clinical management purposes  to differentiate between  SARS-CoV-2 and other Sarbecovirus currently known to infect humans.  If clinically indicated additional testing with an alternate test  methodology [phone])  is advised. The SARS-CoV-2 RNA is generally  detectable in upper and lower respiratory specimens during the acute  phase of infection. The expected result is Negative. Fact Sheet for Patients:   StrictlyIdeas.no Fact Sheet for Healthcare Providers: BankingDealers.co.za This test is not yet approved or cleared by the [place] FDA and has been authorized for detection and/or diagnosis of SARS-CoV-2 by FDA under an Emergency Use Authorization (EUA).  This EUA will remain in effect (meaning this test can be used) for the duration of the COVID-19 declaration under Section 564(b)(1) of the Act, 21 U.S.C. section 360bbb-3(b)(1), unless the authorization is terminated or revoked sooner. Performed at [facility], [place] [address] [place], [place] [address]     Radiology Reports Ct Head Wo Contrast  Result Date: 01/31/2019 CLINICAL DATA:  63 year old male with vertigo, dizziness and multiple falls. EXAM: CT HEAD WITHOUT CONTRAST TECHNIQUE: Contiguous axial images were obtained from the base of the skull through the vertex without intravenous contrast. COMPARISON:  None. FINDINGS: Brain: No evidence of acute infarction, hemorrhage, hydrocephalus, extra-axial collection or mass lesion/mass effect.  Atrophy and mild to moderate white matter hypodensities are noted, likely representing chronic small-vessel white matter ischemic changes. Vascular: Carotid and vertebral atherosclerotic calcifications are noted. Skull: Normal. Negative for fracture or focal lesion. Sinuses/Orbits: A small amount of fluid and mucosal thickening within both maxillary sinuses noted. Other: None IMPRESSION: 1. No evidence of acute intracranial abnormality. 2. Atrophy and mild to moderate probable chronic small-vessel white matter ischemic changes. 3. Small amount of fluid and mucosal thickening within both maxillary sinuses suggesting acute on chronic sinusitis. Electronically Signed   By: Margarette Canada M.D.   On: 01/31/2019 17:27   Dg Chest Port 1 View  Result Date: 01/31/2019 CLINICAL DATA:  Weakness EXAM: PORTABLE CHEST 1 VIEW COMPARISON:  08/10/2010 FINDINGS: Heart  size is enlarged. Aortic calcifications are noted. There is scattered mild airspace opacities bilaterally, for example in the right upper lobe and at the left lung base. There is no pneumothorax. No large pleural effusion. There is no acute osseous abnormality. IMPRESSION: 1. Scattered airspace opacities bilaterally. These are nonspecific but can be seen in patients with an atypical infectious process such as viral pneumonia. Follow-up to radiologic resolution is recommended. Consider a 4-6 week follow-up two-view chest x-ray to document stability or resolution of these findings. 2. Stable cardiac enlargement. Electronically Signed   By: Constance Holster M.D.   On: 01/31/2019 15:12

## 2019-02-01 NOTE — Progress Notes (Signed)
Arrived via [facility] from [facility] , A&Ox4, oriented to the room & POC, callbell in bed with him. Denies pain, denies SOB but c/o gen. weakness.

## 2019-02-01 NOTE — Progress Notes (Signed)
CRITICAL VALUE ALERT  Critical Value: K+ = 7.5  Date & Time Notied:  02/01/19--0715  Provider Notified: prior to this RN notification  Orders Received/Actions taken:new orders noted

## 2019-02-02 LAB — CBC WITH DIFFERENTIAL/PLATELET
Abs Immature Granulocytes: 0.07 10*3/uL (ref 0.00–0.07)
Basophils Absolute: 0 10*3/uL (ref 0.0–0.1)
Basophils Relative: 0 %
Eosinophils Absolute: 0 10*3/uL (ref 0.0–0.5)
Eosinophils Relative: 0 %
HCT: 46.2 % (ref 39.0–52.0)
Hemoglobin: 14.7 g/dL (ref 13.0–17.0)
Immature Granulocytes: 1 %
Lymphocytes Relative: 10 %
Lymphs Abs: 0.8 10*3/uL (ref 0.7–4.0)
MCH: 28.5 pg (ref 26.0–34.0)
MCHC: 31.8 g/dL (ref 30.0–36.0)
MCV: 89.7 fL (ref 80.0–100.0)
Monocytes Absolute: 0.6 10*3/uL (ref 0.1–1.0)
Monocytes Relative: 8 %
Neutro Abs: 6.5 10*3/uL (ref 1.7–7.7)
Neutrophils Relative %: 81 %
Platelets: 358 10*3/uL (ref 150–400)
RBC: 5.15 MIL/uL (ref 4.22–5.81)
RDW: 15.9 % — ABNORMAL HIGH (ref 11.5–15.5)
WBC: 8 10*3/uL (ref 4.0–10.5)
nRBC: 0 % (ref 0.0–0.2)

## 2019-02-02 LAB — FERRITIN: Ferritin: 837 ng/mL — ABNORMAL HIGH (ref 24–336)

## 2019-02-02 LAB — COMPREHENSIVE METABOLIC PANEL
ALT: 64 U/L — ABNORMAL HIGH (ref 0–44)
AST: 39 U/L (ref 15–41)
Albumin: 3.1 g/dL — ABNORMAL LOW (ref 3.5–5.0)
Alkaline Phosphatase: 90 U/L (ref 38–126)
Anion gap: 8 (ref 5–15)
BUN: 75 mg/dL — ABNORMAL HIGH (ref 8–23)
CO2: 27 mmol/L (ref 22–32)
Calcium: 9.1 mg/dL (ref 8.9–10.3)
Chloride: 103 mmol/L (ref 98–111)
Creatinine, Ser: 2.36 mg/dL — ABNORMAL HIGH (ref 0.61–1.24)
GFR calc Af Amer: 32 mL/min — ABNORMAL LOW (ref >60)
GFR calc non Af Amer: 28 mL/min — ABNORMAL LOW (ref >60)
Glucose, Bld: 159 mg/dL — ABNORMAL HIGH (ref 70–99)
Potassium: 5.3 mmol/L — ABNORMAL HIGH (ref 3.5–5.1)
Sodium: 138 mmol/L (ref 135–145)
Total Bilirubin: 1.1 mg/dL (ref 0.3–1.2)
Total Protein: 7.5 g/dL (ref 6.5–8.1)

## 2019-02-02 LAB — T4, FREE: Free T4: 0.44 ng/dL — ABNORMAL LOW (ref 0.61–1.12)

## 2019-02-02 LAB — LACTATE DEHYDROGENASE: LDH: 247 U/L — ABNORMAL HIGH (ref 98–192)

## 2019-02-02 LAB — GLUCOSE, CAPILLARY
Glucose-Capillary: 134 mg/dL — ABNORMAL HIGH (ref 70–99)
Glucose-Capillary: 228 mg/dL — ABNORMAL HIGH (ref 70–99)
Glucose-Capillary: 161 mg/dL — ABNORMAL HIGH (ref 70–99)
Glucose-Capillary: 170 mg/dL — ABNORMAL HIGH (ref 70–99)

## 2019-02-02 LAB — TSH: TSH: 13.91 u[IU]/mL — ABNORMAL HIGH (ref 0.350–4.500)

## 2019-02-02 LAB — MAGNESIUM: Magnesium: 2.6 mg/dL — ABNORMAL HIGH (ref 1.7–2.4)

## 2019-02-02 LAB — C-REACTIVE PROTEIN: CRP: 5.1 mg/dL — ABNORMAL HIGH (ref <1.0)

## 2019-02-02 LAB — BRAIN NATRIURETIC PEPTIDE: B Natriuretic Peptide: 421.5 pg/mL — ABNORMAL HIGH (ref 0.0–100.0)

## 2019-02-02 LAB — D-DIMER, QUANTITATIVE: D-Dimer, Quant: 0.58 ug/mL-FEU — ABNORMAL HIGH (ref 0.00–0.50)

## 2019-02-02 MED ORDER — FUROSEMIDE 20 MG PO TABS
40.0000 mg | ORAL_TABLET | Freq: Once | ORAL | Status: AC
  Administered 2019-02-02: 40 mg via ORAL
  Filled 2019-02-02: qty 2

## 2019-02-02 MED ORDER — SODIUM POLYSTYRENE SULFONATE 15 GM/60ML PO SUSP
30.0000 g | Freq: Once | ORAL | Status: AC
  Administered 2019-02-02: 30 g via ORAL
  Filled 2019-02-02: qty 120

## 2019-02-02 NOTE — Progress Notes (Addendum)
notified Dr Candiss Norse of wound on RLE. Pt to wear TED hose when out of bed only. Per MD 1330- posterior BLE wound 14x6cm of that region 3x3 is opened-  pink, shallow, no s/s of infection, Drainage minimal new dressing applied- Xeroform and gauze dressing and Secure with Kerlex. LLE abrasions - Iodine applied.

## 2019-02-02 NOTE — Progress Notes (Signed)
Secretary ordered [name] TED hose from supply chain around 1330. Hose have not arrived yet as of 1850

## 2019-02-02 NOTE — Progress Notes (Signed)
Inpatient Diabetes Program Recommendations  AACE/ADA: New Consensus Statement on Inpatient Glycemic Control (2015)  Target Ranges:  Prepandial:   less than 140 mg/dL      Peak postprandial:   less than 180 mg/dL (1-2 hours)      Critically ill patients:  140 - 180 mg/dL   Lab Results  Component Value Date   GLUCAP 228 (H) 02/02/2019   HGBA1C 6.7 (H) 01/31/2019    Review of Glycemic Control Results for [name], [name] (MRN [id number]) as of 02/02/2019 12:37  Ref. Range 02/01/2019 07:53 02/01/2019 09:46 02/01/2019 11:36 02/01/2019 16:47 02/01/2019 21:08 02/02/2019 07:38 02/02/2019 11:44  Glucose-Capillary Latest Ref Range: 70 - 99 mg/dL 226 (H) 294 (H) 341 (H) 321 (H) 300 (H) 134 (H) 228 (H)   Diabetes history: DM 2 Outpatient Diabetes medications: Glipizide 5 mg bid Current orders for Inpatient glycemic control:  Novolog 0-9 units tid  No longer on steroids  Inpatient Diabetes Program Recommendations:    Patient may benefit from Novolog meal coverage as postprandial glucose trends increase in the 300 range.  Thanks,  Tama Headings RN, MSN, BC-ADM Inpatient Diabetes Coordinator Team Pager [phone] (8a-5p)

## 2019-02-02 NOTE — Progress Notes (Signed)
Occupational Therapy Treatment Patient Details Name: [name] MRN: [id number] DOB: Jan 23, 1956 Today's Date: 02/02/2019    History of present illness 85 male presenting with complains of dizziness and weakness for the past 2 weeks. Tested COVID positive. PMH including a-fib/flutter flutter status post cardioversion on Pradaxa, systolic CHF, HTN, DM type 2, hyperlipidemia, (pt denies neuropathy on PT eval; + intact light touch, + intact proprioception; not listed on his problem list)   OT comments  Pt progressing towards OT goals this session. Pt min guard ambulating to bathroom for toilet needs with RW and vc for safety with RW. Pt utilizing the grab bars for stability with managing underwear (he states that he is going to have friend install them prior to return home). Pt able to complete sink level grooming leaning against the sink with both elbow for stability - unable to perform in standing without external support. Pt continues to benefit from skilled OT in the acute setting as well as HHOT essential for safety/falls evaluation as well as ensuring IADL are able to be completed in the home setting as Pt lives alone.    Follow Up Recommendations  Home health OT    Equipment Recommendations  3 in 1 bedside commode    Recommendations for Other Services PT consult    Precautions / Restrictions Precautions Precautions: Fall Precaution Comments: Pt reporting 3-4 falls in the last few weeks. Denies dizziness "just get off-balance and down I go" Restrictions Weight Bearing Restrictions: No       Mobility Bed Mobility Overal bed mobility: Independent                Transfers Overall transfer level: Needs assistance Equipment used: Rolling walker (2 wheeled) Transfers: Sit to/from Stand Sit to Stand: Min assist;Min guard(verbal cues, no physical assist needed)         General transfer comment: cues for safety with RW    Balance Overall balance assessment: Needs  assistance Sitting-balance support: No upper extremity supported;Feet unsupported Sitting balance-Leahy Scale: Good     Standing balance support: No upper extremity supported;Bilateral upper extremity supported Standing balance-Leahy Scale: Poor Standing balance comment: dependent on external support for sink level grooming                           ADL either performed or assessed with clinical judgement   ADL Overall ADL's : Needs assistance/impaired     Grooming: Wash/dry hands;Wash/dry face;Oral care;Min guard;Standing Grooming Details (indicate cue type and reason): Min Guard A for safety. Pt leaning on his elbows at sink for support and stability                 Toilet Transfer: Min guard;Ambulation;RW;Regular Toilet;Grab bars Toilet Transfer Details (indicate cue type and reason): Min Guard A for safety during transfer to regular toilet Toileting- Water quality scientist and Hygiene: Min guard;Sit to/from stand Toileting - Clothing Manipulation Details (indicate cue type and reason): Pt utilizing the grab bar for balance to manage underwear     Functional mobility during ADLs: Minimal assistance;Rolling walker;Cueing for safety       Vision       Perception     Praxis      Cognition Arousal/Alertness: Awake/alert Behavior During Therapy: WFL for tasks assessed/performed Overall Cognitive Status: Within Functional Limits for tasks assessed  Exercises     Shoulder Instructions       General Comments      Pertinent Vitals/ Pain       Pain Assessment: No/denies pain  Home Living                                          Prior Functioning/Environment              Frequency  Min 3X/week        Progress Toward Goals  OT Goals(current goals can now be found in the care plan section)  Progress towards OT goals: Progressing toward goals  Acute Rehab OT  Goals Patient Stated Goal: "Get better and go home" OT Goal Formulation: With patient Time For Goal Achievement: 02/15/19 Potential to Achieve Goals: Good  Plan Discharge plan remains appropriate;Frequency remains appropriate    Co-evaluation                 AM-PAC OT "6 Clicks" Daily Activity     Outcome Measure   Help from another person eating meals?: None Help from another person taking care of personal grooming?: A Little Help from another person toileting, which includes using toliet, bedpan, or urinal?: A Little Help from another person bathing (including washing, rinsing, drying)?: A Little Help from another person to put on and taking off regular upper body clothing?: None Help from another person to put on and taking off regular lower body clothing?: A Little 6 Click Score: 20    End of Session Equipment Utilized During Treatment: Rolling walker  OT Visit Diagnosis: Muscle weakness (generalized) (M62.81);History of falling (Z91.81);Repeated falls (R29.6);Unsteadiness on feet (R26.81);Other abnormalities of gait and mobility (R26.89)   Activity Tolerance Patient tolerated treatment well   Patient Left in chair;with call bell/phone within reach   Nurse Communication Mobility status        Time: 1752-1810 OT Time Calculation (min): 18 min  Charges: OT General Charges $OT Visit: 1 Visit OT Treatments $Self Care/Home Management : 8-22 mins  Hulda Humphrey OTR/L Acute Rehabilitation Services Pager: [phone] Office: [place] 02/02/2019, 6:43 PM

## 2019-02-02 NOTE — Consult Note (Signed)
[facility] Nurse wound consult note Patient receiving care in [place].  Patient is COVID +.  Consult completed via Telehealth and the essential assistance of primary RN Melissa.  I appreciate her assistance. Reason for Consult: RLE described as "arterial" by the patient.  He has been receiving care at a local [facility].  The notes for these encounters are not available to me. Wound type: as above Measurement: To be provided by the bedside RN in the flowsheet section Wound bed: pink, shallow, no s/s of infection Drainage (amount, consistency, odor) minimal on existing Xeroform and gauze dressing Periwound: fragile Dressing procedure/placement/frequency:  Place Xeroform gauzes Kellie Simmering [phone]) to wound on the posterior RLE. Secure with Kerlex. The patient also has LLE abrasions from a fall pre-hospital.  For these areas I have ordered Iodine. Monitor the wound area(s) for worsening of condition such as: Signs/symptoms of infection,  Increase in size,  Development of or worsening of odor, Development of pain, or increased pain at the affected locations.  Notify the medical team if any of these develop.  Thank you for the consult. [place] nurse will not follow at this time.  Please re-consult the [facility] team if needed.  Val Riles, RN, MSN, CWOCN, CNS-BC, pager [phone]

## 2019-02-02 NOTE — Evaluation (Addendum)
Physical Therapy Evaluation Patient Details Name: [name] MRN: [id number] DOB: [birth year] Today's Date: 02/02/2019   History of Present Illness  54 male presenting with complains of dizziness and weakness for the past 2 weeks. Tested COVID positive. PMH including a-fib/flutter flutter status post cardioversion on Pradaxa, systolic CHF, HTN, DM type 2, hyperlipidemia, (pt denies neuropathy on PT eval; + intact light touch, + intact proprioception; not listed on his problem list)    Clinical Impression  Pt admitted with above diagnosis. Patient with 3-4 falls since August 1 and denies related to dizziness or lightheadedness--"just get off-balance and down I go." Denies changes in hearing. Denies peripheral neuropathy bil LEs (basic bedside testing of light touch and proprioception at ankles intact). He did have a 18 point drop in SBP while walking with no compensatory incr in HR and was asymptomatic. He has been much less active since November due to RLE wound and protecting it for healing. Unless his wound doctor OKs, he cannot wear compression hose. Educated on increasing walking (with RW and pt agrees to use) and began education on standing balance HEP. Pt currently with functional limitations due to the deficits listed below (see PT Problem List). Pt will benefit from skilled PT to increase their independence and safety with mobility to allow discharge to the venue listed below.       Follow Up Recommendations HH PT;Supervision - Intermittent   NOTE-discussed option of post-acute inpatient/residential rehab and pt is not interested. He reports he has not been using the RW he has (was his mother's) and will begin to use it. Educated on risk of falls, including death, and pt continued to refuse any discharge plan other than home.     Equipment Recommendations  None recommended by PT( pt reports friend is to install grab bar at toilet & shower)    Recommendations for Other Services        Precautions / Restrictions Precautions Precautions: Fall Precaution Comments: Pt reporting 3-4 falls in the last few weeks. Denies dizziness "just get off-balance and down I go"      Mobility  Bed Mobility Overal bed mobility: Independent                Transfers Overall transfer level: Needs assistance Equipment used: Rolling walker (2 wheeled) Transfers: Sit to/from Stand Sit to Stand: Min assist;Min guard         General transfer comment: Providing education on hand placement for RW management. Min A for initial sit<>Stand and gaining balance. Following transfers, pt requires Min Guard A for safety  Ambulation/Gait Ambulation/Gait assistance: Min guard Gait Distance (Feet): 400 Feet Assistive device: Rolling walker (2 wheeled) Gait Pattern/deviations: Step-through pattern;Decreased stride length;Trunk flexed;Wide base of support Gait velocity: decr Gait velocity interpretation: 1.31 - 2.62 ft/sec, indicative of limited community ambulator General Gait Details: vc for upright posture and proximity to RW; vc for light support via UEs (he tends to lean on UEs and heavily push down on RW).   Stairs            Wheelchair Mobility    Modified Rankin (Stroke Patients Only)       Balance Overall balance assessment: Needs assistance Sitting-balance support: No upper extremity supported;Feet unsupported Sitting balance-Leahy Scale: Good     Standing balance support: No upper extremity supported Standing balance-Leahy Scale: Poor(incr sway static standing; further incr with eyes closed)   Single Leg Stance - Right Leg: 2 Single Leg Stance - Left Leg: 0  High level balance activites: Other (comment) High Level Balance Comments: standing heel/toe raises with bil UE support             Pertinent Vitals/Pain Pain Assessment: No/denies pain    Home Living Family/patient expects to be discharged to:: Private residence Living Arrangements:  Alone Available Help at Discharge: Friend(s);Available PRN/intermittently Type of Home: House Home Access: Stairs to enter Entrance Stairs-Rails: None Entrance Stairs-Number of Steps: 2 Home Layout: Two level;Able to live on main level with bedroom/bathroom Home Equipment: Gilford Rile - 2 wheels;Cane - quad(both were his mom's; states they are tall enough for him)      Prior Function Level of Independence: Independent         Comments: prior to August 1 (when falls began); has seen significant decline in activity level as working to get LLE wound healed     Hand Dominance   Dominant Hand: Right    Extremity/Trunk Assessment   Upper Extremity Assessment Upper Extremity Assessment: Defer to OT evaluation    Lower Extremity Assessment Lower Extremity Assessment: RLE deficits/detail;LLE deficits/detail;Generalized weakness RLE Deficits / Details: Pt reporting 5 prior surgeries on RLE wound; presenting with external rotation of right foot  RLE Sensation: WNL(light touch and proprioception) LLE Deficits / Details: multiple abrasions from falls LLE Sensation: WNL(light touch and proprioception )    Cervical / Trunk Assessment Cervical / Trunk Assessment: Normal  Communication   Communication: No difficulties  Cognition Arousal/Alertness: Awake/alert Behavior During Therapy: WFL for tasks assessed/performed Overall Cognitive Status: Within Functional Limits for tasks assessed                                 General Comments: ? memory as did not recall safe use of RW which OT addressed 8/18      General Comments General comments (skin integrity, edema, etc.): Pt with drop in BP with changes in position and when walking. Denied dizziness    Exercises Other Exercises Other Exercises: standing: bil heel/toe raises with 3 sec hold (alternating) x 5; SLS with single UE support x 10 sec each leg   Assessment/Plan    PT Assessment Patient needs continued PT services   PT Problem List Decreased strength;Decreased balance;Decreased mobility;Decreased knowledge of use of DME;Cardiopulmonary status limiting activity;Obesity       PT Treatment Interventions DME instruction;Gait training;Functional mobility training;Therapeutic activities;Therapeutic exercise;Balance training;Neuromuscular re-education;Patient/family education    PT Goals (Current goals can be found in the Care Plan section)  Acute Rehab PT Goals Patient Stated Goal: "Get better and go home" PT Goal Formulation: With patient Time For Goal Achievement: 02/16/19 Potential to Achieve Goals: Good    Frequency Min 3X/week   Barriers to discharge Decreased caregiver support      Co-evaluation               AM-PAC PT "6 Clicks" Mobility  Outcome Measure Help needed turning from your back to your side while in a flat bed without using bedrails?: None Help needed moving from lying on your back to sitting on the side of a flat bed without using bedrails?: None Help needed moving to and from a bed to a chair (including a wheelchair)?: A Little Help needed standing up from a chair using your arms (e.g., wheelchair or bedside chair)?: A Little Help needed to walk in hospital room?: A Little Help needed climbing 3-5 steps with a railing? : A Little 6 Click Score: 20  End of Session   Activity Tolerance: Patient tolerated treatment well Patient left: in chair;with call bell/phone within reach Nurse Communication: Mobility status;Other (comment)(BP decreasing with mobility) PT Visit Diagnosis: Unsteadiness on feet (R26.81);History of falling (Z91.81);Muscle weakness (generalized) (M62.81)    Time: 6015-6153 PT Time Calculation (min) (ACUTE ONLY): 50 min   Charges:   PT Evaluation $PT Eval Moderate Complexity: 1 Mod PT Treatments $Gait Training: 8-22 mins $Therapeutic Exercise: 8-22 mins          Barry Brunner, PT      [place] Tarnowski 02/02/2019, 11:16 AM

## 2019-02-02 NOTE — Consult Note (Signed)
[facility] Nurse wound consult note Patient receiving care in [place].  Patient is COVID +.  I spoke with his primary RN, Melissa.  She will page me when she is available to conduct a Telehealth visit. Val Riles, RN, MSN, CWOCN, CNS-BC, pager [phone]

## 2019-02-02 NOTE — Progress Notes (Signed)
PROGRESS NOTE                                                                                                                                                                                                             Patient Demographics:    [name], is a 63 y.o. male, DOB - 11-13-1955, GYF:[id number]  Outpatient Primary MD for the patient is [name], MD    LOS - 2  Admit date - 01/31/2019    Chief Complaint  Patient presents with  . Dizziness       Brief Narrative  [name] is a 63 y.o. male with medical history significant of atrial fibrillation/flutter flutter status post cardioversion on Pradaxa, systolic CHF ejection fraction 30%, essential hypertension, diabetes mellitus type 2, hyperlipidemia, peripheral neuropathy came to the hospital with complains of dizziness and weakness for the past 2 weeks which is worsened over the last week.  In the ER he was found to be bradycardic, hyperkalemic, in AKI and was incidentally COVID-19 positive.  No pulmonary symptoms.   Subjective:   Patient in bed, appears comfortable, denies any headache, no fever, no chest pain or pressure, no shortness of breath , no abdominal pain. No focal weakness.   Assessment  & Plan :     1. Mild Covid 19 Viral Pneumonitis during the ongoing 2020 Covid 19 Pandemic - he is relatively symptom-free, afebrile no cough or shortness of breath monitor inflammatory markers closely.  For now no intervention.  COVID-19 Labs  Recent Labs    01/31/19 2234 02/01/19 0600 02/02/19 0225  DDIMER  --   --  0.58*  FERRITIN 1,107* 889* 837*  LDH 302*  --  247*  CRP 9.6* 7.6* 5.1*    Lab Results  Component Value Date   SARSCOV2NAA POSITIVE (A) 01/31/2019   SARSCOV2NAA POSITIVE (A) 01/15/2019   SARSCOV2NAA NEGATIVE 12/10/2018     Hepatic Function Latest Ref Rng & Units 02/02/2019 05/03/2018 11/26/2017  Total Protein 6.5 - 8.1  g/dL 7.5 7.2 7.4  Albumin 3.5 - 5.0 g/dL 3.1(L) 4.2 4.2  AST 15 - 41 U/L 39 23 31  ALT 0 - 44 U/L 64(H) 16 35  Alk Phosphatase 38 - 126 U/L 90 69 56  Total Bilirubin 0.3 - 1.2 mg/dL 1.1 1.0 0.9  Bilirubin, Direct 0.00 - 0.40  mg/dL - 0.27 -        Component Value Date/Time   BNP 421.5 (H) 02/02/2019 0225      2.  Symptomatic bradycardia with heart rate in mid 40s - multi factorial likely due to combination of hyperkalemia, high-dose beta-blocker and elevated TSH.  Hyperkalemia has been corrected, beta-blocker held, heart rate is better.  His TSH is hard to interpret due to him being on amiodarone.  TSH around 15, free T4 and T3 are pending will monitor.    3.  Life-threatening hyperkalemia.  Held ARB, Aldactone and potassium supplementation.  Hyperkalemia protocol was followed on 02/01/2019 with good success, repeat Kayexalate and low-dose oral Lasix on 02/02/2019 and continue to monitor.  4.  Chronic systolic heart failure last EF 35% on echocardiogram done in 12/04/2018.  Currently compensated.  ARB and beta-blocker held due to hyperkalemia and bradycardia and ARF, amiodarone continued on 02/02/2019, case discussed with patient's primary cardiologist Dr. Rayann Heman who agrees with the plan on 02/02/2019.  5.  Weakness.  Due to #2  above.  Treatment as #2 above along with PT eval.  6.  Dyslipidemia.  On statin.  7.  AKI on CKD 3 due to combination of dehydration and ARB.  Offending medications held gentle hydration and monitor.  Baseline creatinine is around 1.7.   8.  Paroxysmal atrial fibrillation/atrial flutter.  Mali vas 2 score of greater than 3.  On Pradaxa, rate controlling medications held due to #2 above.  PRN IV Lopressor ordered.  9.  CAD with underlying ischemic cardiomyopathy.  No acute issues.  Statin continued for secondary prevention.  Beta-blocker will be resumed once heart rate is stabilized.  10.  Essential hypertension will be placed on hydralazine and Norvasc combination  along with Imdur and monitor.  11.  History of right lower extremity arterial ulcer.  Continue local wound care thereafter outpatient wound care follow-up as before.   12. DM type II.  For now sliding scale.  A1c was stable.  Lab Results  Component Value Date   HGBA1C 6.7 (H) 01/31/2019   CBG (last 3)  Recent Labs    02/01/19 2108 02/02/19 0738 02/02/19 1144  GLUCAP 300* 134* 228*     Condition -   Guarded  Family Communication  : None  Code Status :  Full  Diet :   Diet Order            Diet Heart Room service appropriate? Yes; Fluid consistency: Thin  Diet effective now               Disposition Plan  :  Home 2-3 days  Consults  :  Cards by admitting MD, I discussed the case with patient's cardiologist Dr. Rayann Heman on 02/02/2019 who agrees with the plan.  Procedures  :    TTE - 11/2018 -   1. The left ventricle has moderate-severely reduced systolic function, with an ejection fraction of 30-35%. The cavity size was moderately dilated. Left ventrical global hypokinesis without regional wall motion abnormalities.  2. The right ventricle has normal systolc function. The cavity was normal. There is no increase in right ventricular wall thickness.  3. Left atrial size was severely dilated.  4. Dense smoke in atria and appendage. Stagnant pre thrombus and likely thrombus in mid to distal LAA High risk for embolic event EP procedure cancelled.  5. Right atrial size was moderately dilated.  6. The mitral valve is grossly normal. Mild thickening of the mitral valve leaflet. Mild  calcification of the mitral valve leaflet. Mitral valve regurgitation is mild to moderate by color flow Doppler.  7. Restricted posterior leaflet motion.  8. The aortic valve is tricuspid Moderate thickening of the aortic valve Sclerosis without any evidence of stenosis of the aortic valve. Aortic valve regurgitation is trivial by color flow Doppler.  9. The aortic root is normal in size and  structure.    PUD Prophylaxis : None  DVT Prophylaxis  :  Pradaxa  Lab Results  Component Value Date   PLT 358 02/02/2019    Inpatient Medications  Scheduled Meds: . amiodarone  200 mg Oral Daily  . amLODipine  10 mg Oral Daily  . dabigatran  150 mg Oral BID  . gabapentin  300 mg Oral BID  . hydrALAZINE  50 mg Oral Q8H  . insulin aspart  0-9 Units Subcutaneous TID WC  . isosorbide mononitrate  30 mg Oral Daily  . mometasone-formoterol  2 puff Inhalation BID  . rosuvastatin  10 mg Oral QHS   Continuous Infusions:  PRN Meds:.acetaminophen **OR** [DISCONTINUED] acetaminophen, albuterol, alum & mag hydroxide-simeth, guaiFENesin-dextromethorphan, hydrALAZINE, hydrocortisone, hydrocortisone cream, lip balm, loratadine, metoprolol tartrate, Muscle Rub, [DISCONTINUED] ondansetron **OR** ondansetron (ZOFRAN) IV, phenol, polyethylene glycol, polyvinyl alcohol, senna-docusate, sodium chloride  Antibiotics  :    Anti-infectives (From admission, onward)   None       Time Spent in minutes  30   Lala Lund M.D on 02/02/2019 at 11:54 AM  To page go to [email or web address] - password [email or web address]  [facility] -  Office  [phone]    See all Orders from today for further details    Objective:   Vitals:   02/01/19 1800 02/01/19 2033 02/02/19 0426 02/02/19 0745  BP:  121/78 125/82 118/80  Pulse: 76  (!) 53 (!) 58  Resp: (!) _0 Temp:  97.9 F (36.6 C) 98 F (36.7 C) (!) 96.8 F (36 C)  TempSrc:   Axillary Axillary  SpO2: 96%  95% 100%  Weight:   122.3 kg   Height:        Wt Readings from Last 3 Encounters:  02/02/19 122.3 kg  01/11/19 129.3 kg  12/28/18 125.4 kg     Intake/Output Summary (Last 24 hours) at 02/02/2019 1154 Last data filed at 02/02/2019 3888 Gross per 24 hour  Intake 1610.28 ml  Output 650 ml  Net 960.28 ml     Physical Exam  Awake Alert, Oriented X 3, No new F.N deficits, Normal affect [place].AT,PERRAL Supple Neck,No JVD, No cervical  lymphadenopathy appriciated.  Symmetrical Chest wall movement, Good air movement bilaterally, CTAB RRR,No Gallops, Rubs or new Murmurs, No Parasternal Heave +ve B.Sounds, Abd Soft, No tenderness, No organomegaly appriciated, No rebound - guarding or rigidity. No Cyanosis, Clubbing or edema, No new Rash or bruise    Data Review:    CBC Recent Labs  Lab 01/31/19 1326 02/01/19 0600 02/02/19 0225  WBC 5.6 4.5 8.0  HGB 15.7 15.4 14.7  HCT 50.3 49.2 46.2  PLT 297 293 358  MCV 91.5 90.3 89.7  MCH 28.5 28.3 28.5  MCHC 31.2 31.3 31.8  RDW 16.1* 16.1* 15.9*  LYMPHSABS  --   --  0.8  MONOABS  --   --  0.6  EOSABS  --   --  0.0  BASOSABS  --   --  0.0    Chemistries  Recent Labs  Lab 01/31/19 1326 01/31/19 2234 02/01/19 0600 02/01/19 0830 02/01/19 1258 02/01/19  1657 02/02/19 0225  NA 135 133* 135  --   --   --  138  K 6.2* 6.0* >7.5* 6.5* 6.1* 5.6* 5.3*  CL 106 104 105  --   --   --  103  CO2 18* 20* 24  --   --   --  27  GLUCOSE 82 117* 222*  --   --   --  159*  BUN 85* 80* 82*  --   --   --  75*  CREATININE 3.92* 3.49* 3.08*  --   --   --  2.36*  CALCIUM 8.7* 8.7* 8.9  --   --   --  9.1  MG  --   --  3.0*  --   --   --  2.6*  AST  --   --   --   --   --   --  39  ALT  --   --   --   --   --   --  64*  ALKPHOS  --   --   --   --   --   --  90  BILITOT  --   --   --   --   --   --  1.1   ------------------------------------------------------------------------------------------------------------------ No results for input(s): CHOL, HDL, LDLCALC, TRIG, CHOLHDL, LDLDIRECT in the last 72 hours.  Lab Results  Component Value Date   HGBA1C 6.7 (H) 01/31/2019   ------------------------------------------------------------------------------------------------------------------ Recent Labs    02/02/19 0225  TSH 13.910*    Cardiac Enzymes No results for input(s): CKMB, TROPONINI, MYOGLOBIN in the last 168 hours.  Invalid input(s): CK  ------------------------------------------------------------------------------------------------------------------    Component Value Date/Time   BNP 421.5 (H) 02/02/2019 0225    Micro Results Recent Results (from the past 240 hour(s))  SARS Coronavirus 2 [facility] order, Performed in [facility] hospital lab) Nasopharyngeal Nasopharyngeal Swab     Status: Abnormal   Collection Time: 01/31/19  2:49 PM   Specimen: Nasopharyngeal Swab  Result Value Ref Range Status   SARS Coronavirus 2 POSITIVE (A) NEGATIVE Final    Comment: RESULT CALLED TO, READ BACK BY AND VERIFIED WITHRobynn Pane RN 1556 01/31/19 A BROWNING (NOTE) If result is NEGATIVE SARS-CoV-2 target nucleic acids are NOT DETECTED. The SARS-CoV-2 RNA is generally detectable in upper and lower  respiratory specimens during the acute phase of infection. The lowest  concentration of SARS-CoV-2 viral copies this assay can detect is 250  copies / mL. A negative result does not preclude SARS-CoV-2 infection  and should not be used as the sole basis for treatment or other  patient management decisions.  A negative result may occur with  improper specimen collection / handling, submission of specimen other  than nasopharyngeal swab, presence of viral mutation(s) within the  areas targeted by this assay, and inadequate number of viral copies  (<250 copies / mL). A negative result must be combined with clinical  observations, patient history, and epidemiological information. If result is POSITIVE SARS-CoV-2 target nucleic acids are DETECTED.  The SARS-CoV-2 RNA is generally detectable in upper and lower  respiratory specimens during the acute phase of infection.  Positive  results are indicative of active infection with SARS-CoV-2.  Clinical  correlation with patient history and other diagnostic information is  necessary to determine patient infection status.  Positive results do  not rule out bacterial infection or co-infection with  other viruses. If result is PRESUMPTIVE POSTIVE SARS-CoV-2 nucleic acids MAY  BE PRESENT.   A presumptive positive result was obtained on the submitted specimen  and confirmed on repeat testing.  While 2019 novel coronavirus  (SARS-CoV-2) nucleic acids may be present in the submitted sample  additional confirmatory testing may be necessary for epidemiological  and / or clinical management purposes  to differentiate between  SARS-CoV-2 and other Sarbecovirus currently known to infect humans.  If clinically indicated additional testing with an alternate test  methodology [phone])  is advised. The SARS-CoV-2 RNA is generally  detectable in upper and lower respiratory specimens during the acute  phase of infection. The expected result is Negative. Fact Sheet for Patients:  StrictlyIdeas.no Fact Sheet for Healthcare Providers: BankingDealers.co.za This test is not yet approved or cleared by the [place] FDA and has been authorized for detection and/or diagnosis of SARS-CoV-2 by FDA under an Emergency Use Authorization (EUA).  This EUA will remain in effect (meaning this test can be used) for the duration of the COVID-19 declaration under Section 564(b)(1) of the Act, 21 U.S.C. section 360bbb-3(b)(1), unless the authorization is terminated or revoked sooner. Performed at [facility], [place] [address] [place], [place] [address]     Radiology Reports Ct Head Wo Contrast  Result Date: 01/31/2019 CLINICAL DATA:  63 year old male with vertigo, dizziness and multiple falls. EXAM: CT HEAD WITHOUT CONTRAST TECHNIQUE: Contiguous axial images were obtained from the base of the skull through the vertex without intravenous contrast. COMPARISON:  None. FINDINGS: Brain: No evidence of acute infarction, hemorrhage, hydrocephalus, extra-axial collection or mass lesion/mass effect. Atrophy and mild to moderate white matter hypodensities are noted,  likely representing chronic small-vessel white matter ischemic changes. Vascular: Carotid and vertebral atherosclerotic calcifications are noted. Skull: Normal. Negative for fracture or focal lesion. Sinuses/Orbits: A small amount of fluid and mucosal thickening within both maxillary sinuses noted. Other: None IMPRESSION: 1. No evidence of acute intracranial abnormality. 2. Atrophy and mild to moderate probable chronic small-vessel white matter ischemic changes. 3. Small amount of fluid and mucosal thickening within both maxillary sinuses suggesting acute on chronic sinusitis. Electronically Signed   By: Margarette Canada M.D.   On: 01/31/2019 17:27   Dg Chest Port 1 View  Result Date: 01/31/2019 CLINICAL DATA:  Weakness EXAM: PORTABLE CHEST 1 VIEW COMPARISON:  08/10/2010 FINDINGS: Heart size is enlarged. Aortic calcifications are noted. There is scattered mild airspace opacities bilaterally, for example in the right upper lobe and at the left lung base. There is no pneumothorax. No large pleural effusion. There is no acute osseous abnormality. IMPRESSION: 1. Scattered airspace opacities bilaterally. These are nonspecific but can be seen in patients with an atypical infectious process such as viral pneumonia. Follow-up to radiologic resolution is recommended. Consider a 4-6 week follow-up two-view chest x-ray to document stability or resolution of these findings. 2. Stable cardiac enlargement. Electronically Signed   By: Constance Holster M.D.   On: 01/31/2019 15:12

## 2019-02-02 NOTE — Progress Notes (Addendum)
Pt request brother be main contact for updates.called and updated brother. Answered all quetions. Brother states he has not being getting updates and was grateful for the call.

## 2019-02-02 NOTE — Progress Notes (Signed)
Placed wound care consult for wound on RLE, pt states he has arterial ulcer and has been seeing an MD for dressing changes twice a week. Has had to change himself last two time.

## 2019-02-03 LAB — CBC WITH DIFFERENTIAL/PLATELET
Abs Immature Granulocytes: 0.07 10*3/uL (ref 0.00–0.07)
Basophils Absolute: 0 10*3/uL (ref 0.0–0.1)
Basophils Relative: 0 %
Eosinophils Absolute: 0.1 10*3/uL (ref 0.0–0.5)
Eosinophils Relative: 1 %
HCT: 47.9 % (ref 39.0–52.0)
Hemoglobin: 15.4 g/dL (ref 13.0–17.0)
Immature Granulocytes: 1 %
Lymphocytes Relative: 21 %
Lymphs Abs: 1 10*3/uL (ref 0.7–4.0)
MCH: 29.2 pg (ref 26.0–34.0)
MCHC: 32.2 g/dL (ref 30.0–36.0)
MCV: 90.7 fL (ref 80.0–100.0)
Monocytes Absolute: 0.4 10*3/uL (ref 0.1–1.0)
Monocytes Relative: 8 %
Neutro Abs: 3.4 10*3/uL (ref 1.7–7.7)
Neutrophils Relative %: 69 %
Platelets: 339 10*3/uL (ref 150–400)
RBC: 5.28 MIL/uL (ref 4.22–5.81)
RDW: 15.9 % — ABNORMAL HIGH (ref 11.5–15.5)
WBC: 5 10*3/uL (ref 4.0–10.5)
nRBC: 0 % (ref 0.0–0.2)

## 2019-02-03 LAB — COMPREHENSIVE METABOLIC PANEL
ALT: 72 U/L — ABNORMAL HIGH (ref 0–44)
AST: 46 U/L — ABNORMAL HIGH (ref 15–41)
Albumin: 3 g/dL — ABNORMAL LOW (ref 3.5–5.0)
Alkaline Phosphatase: 85 U/L (ref 38–126)
Anion gap: 11 (ref 5–15)
BUN: 54 mg/dL — ABNORMAL HIGH (ref 8–23)
CO2: 26 mmol/L (ref 22–32)
Calcium: 8.6 mg/dL — ABNORMAL LOW (ref 8.9–10.3)
Chloride: 103 mmol/L (ref 98–111)
Creatinine, Ser: 1.61 mg/dL — ABNORMAL HIGH (ref 0.61–1.24)
GFR calc Af Amer: 52 mL/min — ABNORMAL LOW (ref >60)
GFR calc non Af Amer: 45 mL/min — ABNORMAL LOW (ref >60)
Glucose, Bld: 106 mg/dL — ABNORMAL HIGH (ref 70–99)
Potassium: 4 mmol/L (ref 3.5–5.1)
Sodium: 140 mmol/L (ref 135–145)
Total Bilirubin: 0.7 mg/dL (ref 0.3–1.2)
Total Protein: 6.9 g/dL (ref 6.5–8.1)

## 2019-02-03 LAB — FERRITIN: Ferritin: 758 ng/mL — ABNORMAL HIGH (ref 24–336)

## 2019-02-03 LAB — GLUCOSE, CAPILLARY
Glucose-Capillary: 176 mg/dL — ABNORMAL HIGH (ref 70–99)
Glucose-Capillary: 218 mg/dL — ABNORMAL HIGH (ref 70–99)
Glucose-Capillary: 150 mg/dL — ABNORMAL HIGH (ref 70–99)
Glucose-Capillary: 183 mg/dL — ABNORMAL HIGH (ref 70–99)

## 2019-02-03 LAB — T3: T3, Total: 70 ng/dL — ABNORMAL LOW (ref 71–180)

## 2019-02-03 LAB — C-REACTIVE PROTEIN: CRP: 2.7 mg/dL — ABNORMAL HIGH (ref <1.0)

## 2019-02-03 LAB — MAGNESIUM: Magnesium: 2.1 mg/dL (ref 1.7–2.4)

## 2019-02-03 LAB — LACTATE DEHYDROGENASE: LDH: 251 U/L — ABNORMAL HIGH (ref 98–192)

## 2019-02-03 LAB — BRAIN NATRIURETIC PEPTIDE: B Natriuretic Peptide: 324.6 pg/mL — ABNORMAL HIGH (ref 0.0–100.0)

## 2019-02-03 LAB — D-DIMER, QUANTITATIVE: D-Dimer, Quant: 0.55 ug/mL-FEU — ABNORMAL HIGH (ref 0.00–0.50)

## 2019-02-03 MED ORDER — LEVOTHYROXINE SODIUM 50 MCG PO TABS
50.0000 ug | ORAL_TABLET | Freq: Every day | ORAL | Status: DC
  Administered 2019-02-03 – 2019-02-05 (×3): 50 ug via ORAL
  Filled 2019-02-03 (×3): qty 1

## 2019-02-03 MED ORDER — FUROSEMIDE 20 MG PO TABS
40.0000 mg | ORAL_TABLET | Freq: Two times a day (BID) | ORAL | Status: DC
  Administered 2019-02-03 – 2019-02-05 (×5): 40 mg via ORAL
  Filled 2019-02-03 (×5): qty 2

## 2019-02-03 NOTE — Progress Notes (Signed)
Physical Therapy Treatment Patient Details Name: [name] MRN: [name] DOB: May 16, 1956 Today's Date: 02/03/2019    History of Present Illness 71 male presenting with complains of dizziness and weakness for the past 2 weeks. Tested COVID positive. PMH including a-fib/flutter flutter status post cardioversion on Pradaxa, systolic CHF, HTN, DM type 2, hyperlipidemia, (pt denies neuropathy on PT eval; + intact light touch, + intact proprioception; not listed on his problem list)    PT Comments    Pt was able to walk around the entire unit with RW, cues for safety.  He completed a standing balance HEP with me (handout given, medbridge:  [id number]).  Pt's O2 sat on RA were in the 90s.  PT to follow acutely until d/c confirmed.   Follow Up Recommendations  Supervision - Intermittent;Home health PT     Equipment Recommendations  None recommended by PT    Recommendations for Other Services   NA     Precautions / Restrictions Precautions Precautions: Fall Precaution Comments: Pt reporting 3-4 falls in the last few weeks. Denies dizziness "just get off-balance and down I go"    Mobility  Bed Mobility Overal bed mobility: Modified Independent                Transfers Overall transfer level: Needs assistance Equipment used: Rolling walker (2 wheeled) Transfers: Sit to/from Stand Sit to Stand: Supervision         General transfer comment: supervision for safety.  Pt moves quickly and then is quick to lean on/pick up RW.  Ambulation/Gait Ambulation/Gait assistance: Supervision Gait Distance (Feet): 400 Feet Assistive device: Rolling walker (2 wheeled) Gait Pattern/deviations: Step-through pattern;Decreased stride length;Trunk flexed;Wide base of support Gait velocity: decreased Gait velocity interpretation: 1.31 - 2.62 ft/sec, indicative of limited community ambulator General Gait Details: Verbal cues for upright posture, closer proximity to RW and not to pick it up  when turning or trying to get around obstacles.           Balance Overall balance assessment: Needs assistance Sitting-balance support: Feet supported;No upper extremity supported Sitting balance-Leahy Scale: Good     Standing balance support: No upper extremity supported Standing balance-Leahy Scale: Poor Standing balance comment: needs external support for balance in standing.                             Cognition Arousal/Alertness: Awake/alert Behavior During Therapy: WFL for tasks assessed/performed Overall Cognitive Status: Within Functional Limits for tasks assessed                                        Exercises Other Exercises Other Exercises: SLS 3 sets each leg multiple trials with goal of 10 seconds in standing at the sink. Other Exercises: Heel/toe raises x10 each in standing at the sink Other Exercises: HEP handout given of the above listed exercises.         Pertinent Vitals/Pain Pain Assessment: No/denies pain           PT Goals (current goals can now be found in the care plan section) Progress towards PT goals: Progressing toward goals    Frequency    Min 3X/week      PT Plan Current plan remains appropriate       AM-PAC PT "6 Clicks" Mobility   Outcome Measure  Help needed turning from your back  to your side while in a flat bed without using bedrails?: None Help needed moving from lying on your back to sitting on the side of a flat bed without using bedrails?: None Help needed moving to and from a bed to a chair (including a wheelchair)?: A Little Help needed standing up from a chair using your arms (e.g., wheelchair or bedside chair)?: None Help needed to walk in hospital room?: A Little Help needed climbing 3-5 steps with a railing? : A Little 6 Click Score: 21    End of Session   Activity Tolerance: Patient tolerated treatment well Patient left: in chair;with call bell/phone within reach Nurse  Communication: Mobility status PT Visit Diagnosis: Unsteadiness on feet (R26.81);History of falling (Z91.81);Muscle weakness (generalized) (M62.81)     Time: [id number] PT Time Calculation (min) (ACUTE ONLY): 20 min  Charges:  $Gait Training: 8-22 mins                    Oral Remache B. Savana Spina, PT, DPT  Acute Rehabilitation [phone] pager [phone] office  @ [facility]: [phone]   02/03/2019, 5:34 PM

## 2019-02-03 NOTE — Progress Notes (Signed)
PROGRESS NOTE                                                                                                                                                                                                             Patient Demographics:    [name], is a 63 y.o. male, DOB - 1955-12-09, [id number]  Outpatient Primary MD for the patient is [name], MD    LOS - 3  Admit date - 01/31/2019    Chief Complaint  Patient presents with   Dizziness       Brief Narrative  [name] is a 63 y.o. male with medical history significant of atrial fibrillation/flutter flutter status post cardioversion on Pradaxa, systolic CHF ejection fraction 30%, essential hypertension, diabetes mellitus type 2, hyperlipidemia, peripheral neuropathy came to the hospital with complains of dizziness and weakness for the past 2 weeks which is worsened over the last week.  In the ER he was found to be bradycardic, hyperkalemic, in AKI and was incidentally COVID-19 positive.  No pulmonary symptoms.   Subjective:   Patient in bed, appears comfortable, denies any headache, no fever, no chest pain or pressure, no shortness of breath , no abdominal pain. No focal weakness.   Assessment  & Plan :     1. Mild Covid 19 Viral Pneumonitis during the ongoing 2020 Covid 19 Pandemic - he is relatively symptom-free, afebrile no cough or shortness of breath monitor inflammatory markers closely.  For now no intervention.  COVID-19 Labs  Recent Labs    01/31/19 2234 02/01/19 0600 02/02/19 0225 02/03/19 0400  DDIMER  --   --  0.58* 0.55*  FERRITIN 1,107* 889* 837* 758*  LDH 302*  --  247* 251*  CRP 9.6* 7.6* 5.1* 2.7*    Lab Results  Component Value Date   SARSCOV2NAA POSITIVE (A) 01/31/2019   SARSCOV2NAA POSITIVE (A) 01/15/2019   SARSCOV2NAA NEGATIVE 12/10/2018     Hepatic Function Latest Ref Rng & Units 02/03/2019 02/02/2019  05/03/2018  Total Protein 6.5 - 8.1 g/dL 6.9 7.5 7.2  Albumin 3.5 - 5.0 g/dL 3.0(L) 3.1(L) 4.2  AST 15 - 41 U/L 46(H) 39 23  ALT 0 - 44 U/L 72(H) 64(H) 16  Alk Phosphatase 38 - 126 U/L 85 90 69  Total Bilirubin 0.3 - 1.2 mg/dL 0.7 1.1 1.0  Bilirubin, Direct 0.00 - 0.40 mg/dL - - 0.27        Component Value Date/Time   BNP 324.6 (H) 02/03/2019 0400      2.  Symptomatic bradycardia with heart rate in mid 40s - multi factorial likely due to combination of hyperkalemia, high-dose beta-blocker and elevated TSH.  Hyperkalemia has been corrected, beta-blocker held, heart rate is better.  His TSH is hard to interpret due to him being on amiodarone.  We were his TSH free T4 and T3 were all low which could suggest central hypothyroidism for now since he was bradycardic I will place him on Synthroid with outpatient PCP and endocrine follow-up.  3.  Life-threatening hyperkalemia.  Held ARB, Aldactone and potassium supplementation.  Hyperkalemia protocol was followed on 02/01/2019 with good success, repeated Kayexalate and low-dose oral Lasix on 02/02/2019 and now potassium is stable.  4.  Chronic systolic heart failure last EF 35% on echocardiogram done in 12/04/2018.  Currently compensated.  ARB and beta-blocker held due to hyperkalemia and bradycardia and ARF, amiodarone continued on 02/02/2019, case discussed with patient's primary cardiologist Dr. Rayann Heman who agrees with the plan on 02/02/2019.  5.  Weakness.  Due to #2  above.  Treatment as #2 above along with PT eval.  6.  Dyslipidemia.  On statin.  7.  AKI on CKD 3 due to combination of dehydration and ARB.  Offending medications held gentle hydration and monitor.  Baseline creatinine is around 1.7.   8.  Paroxysmal atrial fibrillation/atrial flutter.  Mali vas 2 score of greater than 3.  On Pradaxa, rate controlling medications held due to #2 above.  PRN IV Lopressor ordered.  9.  CAD with underlying ischemic cardiomyopathy.  No acute issues.   Statin continued for secondary prevention.  Beta-blocker will be resumed once heart rate is stabilized.  10.  Essential hypertension will be placed on hydralazine and Norvasc combination along with Imdur and monitor.  11.  History of right lower extremity arterial ulcer.  Continue local wound care thereafter outpatient wound care follow-up as before.   12. DM type II.  For now sliding scale.  A1c was stable.  Lab Results  Component Value Date   HGBA1C 6.7 (H) 01/31/2019   CBG (last 3)  Recent Labs    02/02/19 1144 02/02/19 1641 02/02/19 2045  GLUCAP 228* 161* 170*     Condition -   Guarded  Family Communication  : Brother Aundray the main family member was updated in detail on 02/03/2019  Code Status :  Full  Diet :   Diet Order            Diet Heart Room service appropriate? Yes; Fluid consistency: Thin  Diet effective now               Disposition Plan  :  Home most likely on 02/05/2019  Consults  :  Cards by admitting MD, I discussed the case with patient's cardiologist Dr. Rayann Heman on 02/02/2019 who agrees with the plan.  Procedures  :    TTE - 11/2018 -   1. The left ventricle has moderate-severely reduced systolic function, with an ejection fraction of 30-35%. The cavity size was moderately dilated. Left ventrical global hypokinesis without regional wall motion abnormalities.  2. The right ventricle has normal systolc function. The cavity was normal. There is no increase in right ventricular wall thickness.  3. Left atrial size was severely dilated.  4. Dense smoke in atria and appendage. Stagnant pre thrombus  and likely thrombus in mid to distal LAA High risk for embolic event EP procedure cancelled.  5. Right atrial size was moderately dilated.  6. The mitral valve is grossly normal. Mild thickening of the mitral valve leaflet. Mild calcification of the mitral valve leaflet. Mitral valve regurgitation is mild to moderate by color flow Doppler.  7. Restricted  posterior leaflet motion.  8. The aortic valve is tricuspid Moderate thickening of the aortic valve Sclerosis without any evidence of stenosis of the aortic valve. Aortic valve regurgitation is trivial by color flow Doppler.  9. The aortic root is normal in size and structure.    PUD Prophylaxis : None  DVT Prophylaxis  :  Pradaxa  Lab Results  Component Value Date   PLT 339 02/03/2019    Inpatient Medications  Scheduled Meds:  amiodarone  200 mg Oral Daily   amLODipine  10 mg Oral Daily   dabigatran  150 mg Oral BID   gabapentin  300 mg Oral BID   hydrALAZINE  50 mg Oral Q8H   insulin aspart  0-9 Units Subcutaneous TID WC   isosorbide mononitrate  30 mg Oral Daily   levothyroxine  50 mcg Oral QAC breakfast   mometasone-formoterol  2 puff Inhalation BID   rosuvastatin  10 mg Oral QHS   Continuous Infusions:  PRN Meds:.acetaminophen **OR** [DISCONTINUED] acetaminophen, albuterol, alum & mag hydroxide-simeth, guaiFENesin-dextromethorphan, hydrALAZINE, hydrocortisone, hydrocortisone cream, lip balm, loratadine, metoprolol tartrate, Muscle Rub, [DISCONTINUED] ondansetron **OR** ondansetron (ZOFRAN) IV, phenol, polyethylene glycol, polyvinyl alcohol, senna-docusate, sodium chloride  Antibiotics  :    Anti-infectives (From admission, onward)   None       Time Spent in minutes  30   Lala Lund M.D on 02/03/2019 at 9:29 AM  To page go to [email or web address] - password [facility]  [facility] -  Office  [phone]    See all Orders from today for further details    Objective:   Vitals:   02/02/19 1644 02/02/19 1939 02/03/19 0457 02/03/19 0600  BP: 120/84 124/81 128/84   Pulse: 77 69 64   Resp: (!) _0 Temp: 97.7 F (36.5 C) 98 F (36.7 C) 98.5 F (36.9 C)   TempSrc: Oral Oral Oral   SpO2: 92% 92% 93%   Weight:    123.4 kg  Height:        Wt Readings from Last 3 Encounters:  02/03/19 123.4 kg  01/11/19 129.3 kg  12/28/18 125.4 kg      Intake/Output Summary (Last 24 hours) at 02/03/2019 0929 Last data filed at 02/03/2019 0300 Gross per 24 hour  Intake 960 ml  Output --  Net 960 ml     Physical Exam  Awake Alert, Oriented X 3, No new F.N deficits, Normal affect [place].AT,PERRAL Supple Neck,No JVD, No cervical lymphadenopathy appriciated.  Symmetrical Chest wall movement, Good air movement bilaterally, CTAB RRR,No Gallops, Rubs or new Murmurs, No Parasternal Heave +ve B.Sounds, Abd Soft, No tenderness, No organomegaly appriciated, No rebound - guarding or rigidity. No Cyanosis, Clubbing or edema, No new Rash or bruise     Data Review:    CBC Recent Labs  Lab 01/31/19 1326 02/01/19 0600 02/02/19 0225 02/03/19 0400  WBC 5.6 4.5 8.0 5.0  HGB 15.7 15.4 14.7 15.4  HCT 50.3 49.2 46.2 47.9  PLT 297 293 358 339  MCV 91.5 90.3 89.7 90.7  MCH 28.5 28.3 28.5 29.2  MCHC 31.2 31.3 31.8 32.2  RDW 16.1* 16.1* 15.9* 15.9*  LYMPHSABS  --   --  0.8 1.0  MONOABS  --   --  0.6 0.4  EOSABS  --   --  0.0 0.1  BASOSABS  --   --  0.0 0.0    Chemistries  Recent Labs  Lab 01/31/19 1326 01/31/19 2234 02/01/19 0600 02/01/19 0830 02/01/19 1258 02/01/19 1657 02/02/19 0225 02/03/19 0400  NA 135 133* 135  --   --   --  138 140  K 6.2* 6.0* >7.5* 6.5* 6.1* 5.6* 5.3* 4.0  CL 106 104 105  --   --   --  103 103  CO2 18* 20* 24  --   --   --  27 26  GLUCOSE 82 117* 222*  --   --   --  159* 106*  BUN 85* 80* 82*  --   --   --  75* 54*  CREATININE 3.92* 3.49* 3.08*  --   --   --  2.36* 1.61*  CALCIUM 8.7* 8.7* 8.9  --   --   --  9.1 8.6*  MG  --   --  3.0*  --   --   --  2.6* 2.1  AST  --   --   --   --   --   --  39 46*  ALT  --   --   --   --   --   --  64* 72*  ALKPHOS  --   --   --   --   --   --  90 85  BILITOT  --   --   --   --   --   --  1.1 0.7   ------------------------------------------------------------------------------------------------------------------ No results for input(s): CHOL, HDL, LDLCALC,  TRIG, CHOLHDL, LDLDIRECT in the last 72 hours.  Lab Results  Component Value Date   HGBA1C 6.7 (H) 01/31/2019   ------------------------------------------------------------------------------------------------------------------ Recent Labs    02/02/19 0225  TSH 13.910*    Cardiac Enzymes No results for input(s): CKMB, TROPONINI, MYOGLOBIN in the last 168 hours.  Invalid input(s): CK ------------------------------------------------------------------------------------------------------------------    Component Value Date/Time   BNP 324.6 (H) 02/03/2019 0400    Micro Results Recent Results (from the past 240 hour(s))  SARS Coronavirus 2 [facility] order, Performed in [facility] hospital lab) Nasopharyngeal Nasopharyngeal Swab     Status: Abnormal   Collection Time: 01/31/19  2:49 PM   Specimen: Nasopharyngeal Swab  Result Value Ref Range Status   SARS Coronavirus 2 POSITIVE (A) NEGATIVE Final    Comment: RESULT CALLED TO, READ BACK BY AND VERIFIED WITHRobynn Pane RN 1556 01/31/19 A BROWNING (NOTE) If result is NEGATIVE SARS-CoV-2 target nucleic acids are NOT DETECTED. The SARS-CoV-2 RNA is generally detectable in upper and lower  respiratory specimens during the acute phase of infection. The lowest  concentration of SARS-CoV-2 viral copies this assay can detect is 250  copies / mL. A negative result does not preclude SARS-CoV-2 infection  and should not be used as the sole basis for treatment or other  patient management decisions.  A negative result may occur with  improper specimen collection / handling, submission of specimen other  than nasopharyngeal swab, presence of viral mutation(s) within the  areas targeted by this assay, and inadequate number of viral copies  (<250 copies / mL). A negative result must be combined with clinical  observations, patient history, and epidemiological information. If result is POSITIVE SARS-CoV-2 target nucleic acids are  DETECTED.  The SARS-CoV-2 RNA is generally detectable in upper and lower  respiratory specimens during the acute phase of infection.  Positive  results are indicative of active infection with SARS-CoV-2.  Clinical  correlation with patient history and other diagnostic information is  necessary to determine patient infection status.  Positive results do  not rule out bacterial infection or co-infection with other viruses. If result is PRESUMPTIVE POSTIVE SARS-CoV-2 nucleic acids MAY BE PRESENT.   A presumptive positive result was obtained on the submitted specimen  and confirmed on repeat testing.  While 2019 novel coronavirus  (SARS-CoV-2) nucleic acids may be present in the submitted sample  additional confirmatory testing may be necessary for epidemiological  and / or clinical management purposes  to differentiate between  SARS-CoV-2 and other Sarbecovirus currently known to infect humans.  If clinically indicated additional testing with an alternate test  methodology [phone])  is advised. The SARS-CoV-2 RNA is generally  detectable in upper and lower respiratory specimens during the acute  phase of infection. The expected result is Negative. Fact Sheet for Patients:  StrictlyIdeas.no Fact Sheet for Healthcare Providers: BankingDealers.co.za This test is not yet approved or cleared by the [place] FDA and has been authorized for detection and/or diagnosis of SARS-CoV-2 by FDA under an Emergency Use Authorization (EUA).  This EUA will remain in effect (meaning this test can be used) for the duration of the COVID-19 declaration under Section 564(b)(1) of the Act, 21 U.S.C. section 360bbb-3(b)(1), unless the authorization is terminated or revoked sooner. Performed at [facility], [place] [address] [place], [place] [address]     Radiology Reports Ct Head Wo Contrast  Result Date: 01/31/2019 CLINICAL DATA:  63 year old male  with vertigo, dizziness and multiple falls. EXAM: CT HEAD WITHOUT CONTRAST TECHNIQUE: Contiguous axial images were obtained from the base of the skull through the vertex without intravenous contrast. COMPARISON:  None. FINDINGS: Brain: No evidence of acute infarction, hemorrhage, hydrocephalus, extra-axial collection or mass lesion/mass effect. Atrophy and mild to moderate white matter hypodensities are noted, likely representing chronic small-vessel white matter ischemic changes. Vascular: Carotid and vertebral atherosclerotic calcifications are noted. Skull: Normal. Negative for fracture or focal lesion. Sinuses/Orbits: A small amount of fluid and mucosal thickening within both maxillary sinuses noted. Other: None IMPRESSION: 1. No evidence of acute intracranial abnormality. 2. Atrophy and mild to moderate probable chronic small-vessel white matter ischemic changes. 3. Small amount of fluid and mucosal thickening within both maxillary sinuses suggesting acute on chronic sinusitis. Electronically Signed   By: Margarette Canada M.D.   On: 01/31/2019 17:27   Dg Chest Port 1 View  Result Date: 01/31/2019 CLINICAL DATA:  Weakness EXAM: PORTABLE CHEST 1 VIEW COMPARISON:  08/10/2010 FINDINGS: Heart size is enlarged. Aortic calcifications are noted. There is scattered mild airspace opacities bilaterally, for example in the right upper lobe and at the left lung base. There is no pneumothorax. No large pleural effusion. There is no acute osseous abnormality. IMPRESSION: 1. Scattered airspace opacities bilaterally. These are nonspecific but can be seen in patients with an atypical infectious process such as viral pneumonia. Follow-up to radiologic resolution is recommended. Consider a 4-6 week follow-up two-view chest x-ray to document stability or resolution of these findings. 2. Stable cardiac enlargement. Electronically Signed   By: Constance Holster M.D.   On: 01/31/2019 15:12

## 2019-02-04 LAB — COMPREHENSIVE METABOLIC PANEL
ALT: 66 U/L — ABNORMAL HIGH (ref 0–44)
AST: 40 U/L (ref 15–41)
Albumin: 3 g/dL — ABNORMAL LOW (ref 3.5–5.0)
Alkaline Phosphatase: 79 U/L (ref 38–126)
Anion gap: 7 (ref 5–15)
BUN: 44 mg/dL — ABNORMAL HIGH (ref 8–23)
CO2: 28 mmol/L (ref 22–32)
Calcium: 9 mg/dL (ref 8.9–10.3)
Chloride: 107 mmol/L (ref 98–111)
Creatinine, Ser: 1.59 mg/dL — ABNORMAL HIGH (ref 0.61–1.24)
GFR calc Af Amer: 52 mL/min — ABNORMAL LOW (ref >60)
GFR calc non Af Amer: 45 mL/min — ABNORMAL LOW (ref >60)
Glucose, Bld: 126 mg/dL — ABNORMAL HIGH (ref 70–99)
Potassium: 4.1 mmol/L (ref 3.5–5.1)
Sodium: 142 mmol/L (ref 135–145)
Total Bilirubin: 0.9 mg/dL (ref 0.3–1.2)
Total Protein: 7 g/dL (ref 6.5–8.1)

## 2019-02-04 LAB — C-REACTIVE PROTEIN: CRP: 1.7 mg/dL — ABNORMAL HIGH (ref <1.0)

## 2019-02-04 LAB — CBC WITH DIFFERENTIAL/PLATELET
Abs Immature Granulocytes: 0.2 10*3/uL — ABNORMAL HIGH (ref 0.00–0.07)
Basophils Absolute: 0 10*3/uL (ref 0.0–0.1)
Basophils Relative: 1 %
Eosinophils Absolute: 0.1 10*3/uL (ref 0.0–0.5)
Eosinophils Relative: 1 %
HCT: 47.3 % (ref 39.0–52.0)
Hemoglobin: 15.3 g/dL (ref 13.0–17.0)
Immature Granulocytes: 4 %
Lymphocytes Relative: 21 %
Lymphs Abs: 1.1 10*3/uL (ref 0.7–4.0)
MCH: 29 pg (ref 26.0–34.0)
MCHC: 32.3 g/dL (ref 30.0–36.0)
MCV: 89.6 fL (ref 80.0–100.0)
Monocytes Absolute: 0.6 10*3/uL (ref 0.1–1.0)
Monocytes Relative: 10 %
Neutro Abs: 3.4 10*3/uL (ref 1.7–7.7)
Neutrophils Relative %: 63 %
Platelets: 326 10*3/uL (ref 150–400)
RBC: 5.28 MIL/uL (ref 4.22–5.81)
RDW: 15.9 % — ABNORMAL HIGH (ref 11.5–15.5)
WBC: 5.3 10*3/uL (ref 4.0–10.5)
nRBC: 0 % (ref 0.0–0.2)

## 2019-02-04 LAB — BRAIN NATRIURETIC PEPTIDE: B Natriuretic Peptide: 428.6 pg/mL — ABNORMAL HIGH (ref 0.0–100.0)

## 2019-02-04 LAB — MAGNESIUM: Magnesium: 1.8 mg/dL (ref 1.7–2.4)

## 2019-02-04 LAB — GLUCOSE, CAPILLARY
Glucose-Capillary: 128 mg/dL — ABNORMAL HIGH (ref 70–99)
Glucose-Capillary: 212 mg/dL — ABNORMAL HIGH (ref 70–99)
Glucose-Capillary: 128 mg/dL — ABNORMAL HIGH (ref 70–99)
Glucose-Capillary: 143 mg/dL — ABNORMAL HIGH (ref 70–99)

## 2019-02-04 LAB — FERRITIN: Ferritin: 698 ng/mL — ABNORMAL HIGH (ref 24–336)

## 2019-02-04 LAB — D-DIMER, QUANTITATIVE: D-Dimer, Quant: 0.58 ug/mL-FEU — ABNORMAL HIGH (ref 0.00–0.50)

## 2019-02-04 LAB — LACTATE DEHYDROGENASE: LDH: 243 U/L — ABNORMAL HIGH (ref 98–192)

## 2019-02-04 MED ORDER — ISOSORBIDE MONONITRATE ER 30 MG PO TB24
60.0000 mg | ORAL_TABLET | Freq: Every day | ORAL | Status: DC
  Administered 2019-02-04 – 2019-02-05 (×2): 60 mg via ORAL
  Filled 2019-02-04 (×2): qty 2

## 2019-02-04 NOTE — Progress Notes (Signed)
Occupational Therapy Treatment Patient Details Name: [name] MRN: [id number] DOB: 05-20-1956 Today's Date: 02/04/2019    History of present illness 63 male presenting with complains of dizziness and weakness for the past 2 weeks. Tested COVID positive. PMH including a-fib/flutter flutter status post cardioversion on Pradaxa, systolic CHF, HTN, DM type 2, hyperlipidemia, (pt denies neuropathy on PT eval; + intact light touch, + intact proprioception; not listed on his problem list)   OT comments  Pt making good progress. Given that pt lives alone and was completely independent, continue to recommend [place] to address IADL and reduce risk of falls. All further OT to be addressed in next venue.   Follow Up Recommendations  Supervision - Intermittent;Home health OT    Equipment Recommendations  3 in 1 bedside commode    Recommendations for Other Services      Precautions / Restrictions Precautions Precautions: Fall Precaution Comments: Pt reporting 3-4 falls in the last few weeks. Denies dizziness "just get off-balance and down I go"       Mobility Bed Mobility                  Transfers Overall transfer level: Needs assistance Equipment used: Rolling walker (2 wheeled) Transfers: Sit to/from [facility] Sit to Stand: Supervision Stand pivot transfers: Supervision            Balance             Standing balance-Leahy Scale: Poor Standing balance comment: needs external support for balance in standing.                            ADL either performed or assessed with clinical judgement   ADL                                       Functional mobility during ADLs: Supervision/safety;Rolling walker General ADL Comments: Educated pt on energy conservaiton strategies. Pt verbalized understanding. Since pt has had multiple falls, recommend pt use 3in1 as shower seat. Pt verbalized understanding.      Vision       Perception     Praxis      Cognition Arousal/Alertness: Awake/alert Behavior During Therapy: WFL for tasks assessed/performed Overall Cognitive Status: Within Functional Limits for tasks assessed                                          Exercises Other Exercises Other Exercises: theraband level 2 general UB strengthening exericses - written HEP provided   Shoulder Instructions       General Comments  States he just walked around unit 1.5 times with nursing    Pertinent Vitals/ Pain       Pain Assessment: No/denies pain  Home Living                                          Prior Functioning/Environment              Frequency           Progress Toward Goals  OT Goals(current goals can now be found in the care plan section)  Progress  towards OT goals: Goals met/education completed, patient discharged from OT;Progressing toward goals  Acute Rehab OT Goals Patient Stated Goal: "Get better and go home" OT Goal Formulation: With patient ADL Goals Pt Will Perform Grooming: with modified independence;standing Pt Will Perform Lower Body Dressing: with modified independence;sit to/from stand Pt Will Transfer to Toilet: with modified independence;ambulating;regular height toilet Pt Will Perform Toileting - Clothing Manipulation and hygiene: with modified independence;sit to/from stand;sitting/lateral leans Pt/caregiver will Perform Home Exercise Program: Increased strength;With theraband;With written HEP provided;Independently;Both right and left upper extremity Additional ADL Goal #1: Pt will independently verbalize three energy conservation techniques for ADLs/IADLs  Plan Discharge plan remains appropriate    Co-evaluation                 AM-PAC OT "6 Clicks" Daily Activity     Outcome Measure   Help from another person eating meals?: None Help from another person taking care of personal grooming?: None Help from  another person toileting, which includes using toliet, bedpan, or urinal?: None Help from another person bathing (including washing, rinsing, drying)?: None Help from another person to put on and taking off regular upper body clothing?: None Help from another person to put on and taking off regular lower body clothing?: None 6 Click Score: 24    End of Session Equipment Utilized During Treatment: Rolling walker  OT Visit Diagnosis: Muscle weakness (generalized) (M62.81);History of falling (Z91.81);Repeated falls (R29.6);Unsteadiness on feet (R26.81);Other abnormalities of gait and mobility (R26.89)   Activity Tolerance Patient tolerated treatment well   Patient Left in chair;with call bell/phone within reach   Nurse Communication Mobility status        Time: 5830-7460 OT Time Calculation (min): 19 min  Charges: OT General Charges $OT Visit: 1 Visit OT Treatments $Self Care/Home Management : 8-22 mins  Maurie Boettcher, OT/L   Acute OT Clinical Specialist [place] Pager [phone] Office [phone]    [facility] 02/04/2019, 5:13 PM

## 2019-02-04 NOTE — Progress Notes (Signed)
PROGRESS NOTE                                                                                                                                                                                                             Patient Demographics:    [name], is a 63 y.o. male, DOB - [birth year], HGD:[id number]  Outpatient Primary MD for the patient is [name], MD    LOS - 4  Admit date - 01/31/2019    Chief Complaint  Patient presents with  . Dizziness       Brief Narrative  [name] is a 63 y.o. male with medical history significant of atrial fibrillation/flutter flutter status post cardioversion on Pradaxa, systolic CHF ejection fraction 30%, essential hypertension, diabetes mellitus type 2, hyperlipidemia, peripheral neuropathy came to the hospital with complains of dizziness and weakness for the past 2 weeks which is worsened over the last week.  In the ER he was found to be bradycardic, hyperkalemic, in AKI and was incidentally COVID-19 positive.  No pulmonary symptoms.   Subjective:   Patient in bed, appears comfortable, denies any headache, no fever, no chest pain or pressure, no shortness of breath , no abdominal pain. No focal weakness.   Assessment  & Plan :     1. Mild Covid 19 Viral Pneumonitis during the ongoing 2020 Covid 19 Pandemic - he is relatively symptom-free, afebrile no cough or shortness of breath monitor inflammatory markers closely.  For now no intervention.  COVID-19 Labs  Recent Labs    02/02/19 0225 02/03/19 0400 02/04/19 0428  DDIMER 0.58* 0.55* 0.58*  FERRITIN 837* 758* 698*  LDH 247* 251* 243*  CRP 5.1* 2.7* 1.7*    Lab Results  Component Value Date   SARSCOV2NAA POSITIVE (A) 01/31/2019   SARSCOV2NAA POSITIVE (A) 01/15/2019   SARSCOV2NAA NEGATIVE 12/10/2018     Hepatic Function Latest Ref Rng & Units 02/04/2019 02/03/2019 02/02/2019  Total Protein 6.5 - 8.1  g/dL 7.0 6.9 7.5  Albumin 3.5 - 5.0 g/dL 3.0(L) 3.0(L) 3.1(L)  AST 15 - 41 U/L 40 46(H) 39  ALT 0 - 44 U/L 66(H) 72(H) 64(H)  Alk Phosphatase 38 - 126 U/L 79 85 90  Total Bilirubin 0.3 - 1.2 mg/dL 0.9 0.7 1.1  Bilirubin, Direct 0.00 - 0.40 mg/dL - - -  Component Value Date/Time   BNP 428.6 (H) 02/04/2019 0428      2.  Symptomatic bradycardia with heart rate in mid 40s - multi factorial likely due to combination of hyperkalemia, high-dose beta-blocker and elevated TSH.  Hyperkalemia has been corrected, beta-blocker held, heart rate is better.  His TSH is hard to interpret due to him being on amiodarone.  We were his TSH free T4 and T3 were all low which could suggest central hypothyroidism for now since he was bradycardic I will place him on Synthroid with outpatient PCP and endocrine follow-up.  3.  Life-threatening hyperkalemia.  Held ARB, Aldactone and potassium supplementation.  Hyperkalemia protocol was followed on 02/01/2019 with good success, repeated Kayexalate and low-dose oral Lasix on 02/02/2019 and now potassium is stable.  4.  Chronic systolic heart failure last EF 35% on echocardiogram done in 12/04/2018.  Currently compensated.  ARB and beta-blocker held due to hyperkalemia and bradycardia and ARF, amiodarone continued on 02/02/2019, case discussed with patient's primary cardiologist Dr. Rayann Heman who agrees with the plan on 02/02/2019.  5.  Weakness.  Due to #2  above.  Treatment as #2 above along with PT eval.  6.  Dyslipidemia.  On statin.  7.  AKI on CKD 3 due to combination of dehydration and ARB.  Offending medications held gentle hydration and monitor.  Baseline creatinine is around 1.7.   8.  Paroxysmal atrial fibrillation/atrial flutter.  Mali vas 2 score of greater than 3.  On Pradaxa, rate controlling medications held due to #2 above.  PRN IV Lopressor ordered.  9.  CAD with underlying ischemic cardiomyopathy.  No acute issues.  Statin continued for secondary  prevention.  Beta-blocker will be resumed once heart rate is stabilized.  10.  Essential hypertension will be placed on hydralazine and Norvasc combination along with Imdur and monitor.  11.  History of right lower extremity arterial ulcer.  Continue local wound care thereafter outpatient wound care follow-up as before.   12. DM type II.  For now sliding scale.  A1c was stable.  Lab Results  Component Value Date   HGBA1C 6.7 (H) 01/31/2019   CBG (last 3)  Recent Labs    02/03/19 1706 02/03/19 2121 02/04/19 0746  GLUCAP 150* 183* 128*     Condition -   Guarded  Family Communication  : Brother Teodoro the main family member was updated in detail on 02/03/2019  Code Status :  Full  Diet :   Diet Order            Diet Heart Room service appropriate? Yes; Fluid consistency: Thin  Diet effective now               Disposition Plan  :  Home most likely on 02/05/2019  Consults  :  Cards by admitting MD, I discussed the case with patient's cardiologist Dr. Rayann Heman on 02/02/2019 who agrees with the plan.  Procedures  :    TTE - 11/2018 -   1. The left ventricle has moderate-severely reduced systolic function, with an ejection fraction of 30-35%. The cavity size was moderately dilated. Left ventrical global hypokinesis without regional wall motion abnormalities.  2. The right ventricle has normal systolc function. The cavity was normal. There is no increase in right ventricular wall thickness.  3. Left atrial size was severely dilated.  4. Dense smoke in atria and appendage. Stagnant pre thrombus and likely thrombus in mid to distal LAA High risk for embolic event EP procedure cancelled.  5. Right atrial size was moderately dilated.  6. The mitral valve is grossly normal. Mild thickening of the mitral valve leaflet. Mild calcification of the mitral valve leaflet. Mitral valve regurgitation is mild to moderate by color flow Doppler.  7. Restricted posterior leaflet motion.  8. The  aortic valve is tricuspid Moderate thickening of the aortic valve Sclerosis without any evidence of stenosis of the aortic valve. Aortic valve regurgitation is trivial by color flow Doppler.  9. The aortic root is normal in size and structure.    PUD Prophylaxis : None  DVT Prophylaxis  :  Pradaxa  Lab Results  Component Value Date   PLT 326 02/04/2019    Inpatient Medications  Scheduled Meds: . amiodarone  200 mg Oral Daily  . amLODipine  10 mg Oral Daily  . dabigatran  150 mg Oral BID  . furosemide  40 mg Oral BID  . gabapentin  300 mg Oral BID  . hydrALAZINE  50 mg Oral Q8H  . insulin aspart  0-9 Units Subcutaneous TID WC  . isosorbide mononitrate  60 mg Oral Daily  . levothyroxine  50 mcg Oral QAC breakfast  . mometasone-formoterol  2 puff Inhalation BID  . rosuvastatin  10 mg Oral QHS   Continuous Infusions:  PRN Meds:.acetaminophen **OR** [DISCONTINUED] acetaminophen, albuterol, alum & mag hydroxide-simeth, guaiFENesin-dextromethorphan, hydrALAZINE, hydrocortisone, hydrocortisone cream, lip balm, loratadine, metoprolol tartrate, Muscle Rub, [DISCONTINUED] ondansetron **OR** ondansetron (ZOFRAN) IV, phenol, polyethylene glycol, polyvinyl alcohol, senna-docusate, sodium chloride  Antibiotics  :    Anti-infectives (From admission, onward)   None       Time Spent in minutes  30   Lala Lund M.D on 02/04/2019 at 10:11 AM  To page go to [email or web address] - password [facility]  [facility] -  Office  [phone]    See all Orders from today for further details    Objective:   Vitals:   02/04/19 0607 02/04/19 0608 02/04/19 0747 02/04/19 0909  BP: (!) 146/95 (!) 146/95  (!) 116/51  Pulse:  63    Resp: 17     Temp: (!) 97.5 F (36.4 C)  97.6 F (36.4 C)   TempSrc: Oral  Oral   SpO2:  95%    Weight:      Height:        Wt Readings from Last 3 Encounters:  02/03/19 123.4 kg  01/11/19 129.3 kg  12/28/18 125.4 kg     Intake/Output Summary (Last 24  hours) at 02/04/2019 1011 Last data filed at 02/04/2019 0600 Gross per 24 hour  Intake 360 ml  Output 2 ml  Net 358 ml     Physical Exam  Awake Alert, Oriented X 3, No new F.N deficits, Normal affect [place].AT,PERRAL Supple Neck,No JVD, No cervical lymphadenopathy appriciated.  Symmetrical Chest wall movement, Good air movement bilaterally, CTAB RRR,No Gallops, Rubs or new Murmurs, No Parasternal Heave +ve B.Sounds, Abd Soft, No tenderness, No organomegaly appriciated, No rebound - guarding or rigidity. No Cyanosis, Clubbing or edema, right leg medial ulcer stable under bandage.  No surrounding cellulitis.    Data Review:    CBC Recent Labs  Lab 01/31/19 1326 02/01/19 0600 02/02/19 0225 02/03/19 0400 02/04/19 0428  WBC 5.6 4.5 8.0 5.0 5.3  HGB 15.7 15.4 14.7 15.4 15.3  HCT 50.3 49.2 46.2 47.9 47.3  PLT 297 293 358 339 326  MCV 91.5 90.3 89.7 90.7 89.6  MCH 28.5 28.3 28.5 29.2 29.0  MCHC 31.2 31.3 31.8 32.2 32.3  RDW 16.1* 16.1* 15.9* 15.9* 15.9*  LYMPHSABS  --   --  0.8 1.0 1.1  MONOABS  --   --  0.6 0.4 0.6  EOSABS  --   --  0.0 0.1 0.1  BASOSABS  --   --  0.0 0.0 0.0    Chemistries  Recent Labs  Lab 01/31/19 2234 02/01/19 0600  02/01/19 1258 02/01/19 1657 02/02/19 0225 02/03/19 0400 02/04/19 0428  NA 133* 135  --   --   --  138 140 142  K 6.0* >7.5*   < > 6.1* 5.6* 5.3* 4.0 4.1  CL 104 105  --   --   --  103 103 107  CO2 20* 24  --   --   --  27 26 28   GLUCOSE 117* 222*  --   --   --  159* 106* 126*  BUN 80* 82*  --   --   --  75* 54* 44*  CREATININE 3.49* 3.08*  --   --   --  2.36* 1.61* 1.59*  CALCIUM 8.7* 8.9  --   --   --  9.1 8.6* 9.0  MG  --  3.0*  --   --   --  2.6* 2.1 1.8  AST  --   --   --   --   --  39 46* 40  ALT  --   --   --   --   --  64* 72* 66*  ALKPHOS  --   --   --   --   --  90 85 79  BILITOT  --   --   --   --   --  1.1 0.7 0.9   < > = values in this interval not displayed.    ------------------------------------------------------------------------------------------------------------------ No results for input(s): CHOL, HDL, LDLCALC, TRIG, CHOLHDL, LDLDIRECT in the last 72 hours.  Lab Results  Component Value Date   HGBA1C 6.7 (H) 01/31/2019   ------------------------------------------------------------------------------------------------------------------ Recent Labs    02/02/19 0225  TSH 13.910*    Cardiac Enzymes No results for input(s): CKMB, TROPONINI, MYOGLOBIN in the last 168 hours.  Invalid input(s): CK ------------------------------------------------------------------------------------------------------------------    Component Value Date/Time   BNP 428.6 (H) 02/04/2019 0428    Micro Results Recent Results (from the past 240 hour(s))  SARS Coronavirus 2 [facility] order, Performed in [facility] hospital lab) Nasopharyngeal Nasopharyngeal Swab     Status: Abnormal   Collection Time: 01/31/19  2:49 PM   Specimen: Nasopharyngeal Swab  Result Value Ref Range Status   SARS Coronavirus 2 POSITIVE (A) NEGATIVE Final    Comment: RESULT CALLED TO, READ BACK BY AND VERIFIED WITHRobynn Pane RN 1556 01/31/19 A BROWNING (NOTE) If result is NEGATIVE SARS-CoV-2 target nucleic acids are NOT DETECTED. The SARS-CoV-2 RNA is generally detectable in upper and lower  respiratory specimens during the acute phase of infection. The lowest  concentration of SARS-CoV-2 viral copies this assay can detect is 250  copies / mL. A negative result does not preclude SARS-CoV-2 infection  and should not be used as the sole basis for treatment or other  patient management decisions.  A negative result may occur with  improper specimen collection / handling, submission of specimen other  than nasopharyngeal swab, presence of viral mutation(s) within the  areas targeted by this assay, and inadequate number of viral copies  (<250 copies / mL). A negative result must be  combined with clinical  observations, patient  history, and epidemiological information. If result is POSITIVE SARS-CoV-2 target nucleic acids are DETECTED.  The SARS-CoV-2 RNA is generally detectable in upper and lower  respiratory specimens during the acute phase of infection.  Positive  results are indicative of active infection with SARS-CoV-2.  Clinical  correlation with patient history and other diagnostic information is  necessary to determine patient infection status.  Positive results do  not rule out bacterial infection or co-infection with other viruses. If result is PRESUMPTIVE POSTIVE SARS-CoV-2 nucleic acids MAY BE PRESENT.   A presumptive positive result was obtained on the submitted specimen  and confirmed on repeat testing.  While 2019 novel coronavirus  (SARS-CoV-2) nucleic acids may be present in the submitted sample  additional confirmatory testing may be necessary for epidemiological  and / or clinical management purposes  to differentiate between  SARS-CoV-2 and other Sarbecovirus currently known to infect humans.  If clinically indicated additional testing with an alternate test  methodology [phone])  is advised. The SARS-CoV-2 RNA is generally  detectable in upper and lower respiratory specimens during the acute  phase of infection. The expected result is Negative. Fact Sheet for Patients:  StrictlyIdeas.no Fact Sheet for Healthcare Providers: BankingDealers.co.za This test is not yet approved or cleared by the [place] FDA and has been authorized for detection and/or diagnosis of SARS-CoV-2 by FDA under an Emergency Use Authorization (EUA).  This EUA will remain in effect (meaning this test can be used) for the duration of the COVID-19 declaration under Section 564(b)(1) of the Act, 21 U.S.C. section 360bbb-3(b)(1), unless the authorization is terminated or revoked sooner. Performed at [facility], [place] [address] [place], [place] [address]     Radiology Reports Ct Head Wo Contrast  Result Date: 01/31/2019 CLINICAL DATA:  63 year old male with vertigo, dizziness and multiple falls. EXAM: CT HEAD WITHOUT CONTRAST TECHNIQUE: Contiguous axial images were obtained from the base of the skull through the vertex without intravenous contrast. COMPARISON:  None. FINDINGS: Brain: No evidence of acute infarction, hemorrhage, hydrocephalus, extra-axial collection or mass lesion/mass effect. Atrophy and mild to moderate white matter hypodensities are noted, likely representing chronic small-vessel white matter ischemic changes. Vascular: Carotid and vertebral atherosclerotic calcifications are noted. Skull: Normal. Negative for fracture or focal lesion. Sinuses/Orbits: A small amount of fluid and mucosal thickening within both maxillary sinuses noted. Other: None IMPRESSION: 1. No evidence of acute intracranial abnormality. 2. Atrophy and mild to moderate probable chronic small-vessel white matter ischemic changes. 3. Small amount of fluid and mucosal thickening within both maxillary sinuses suggesting acute on chronic sinusitis. Electronically Signed   By: Margarette Canada M.D.   On: 01/31/2019 17:27   Dg Chest Port 1 View  Result Date: 01/31/2019 CLINICAL DATA:  Weakness EXAM: PORTABLE CHEST 1 VIEW COMPARISON:  08/10/2010 FINDINGS: Heart size is enlarged. Aortic calcifications are noted. There is scattered mild airspace opacities bilaterally, for example in the right upper lobe and at the left lung base. There is no pneumothorax. No large pleural effusion. There is no acute osseous abnormality. IMPRESSION: 1. Scattered airspace opacities bilaterally. These are nonspecific but can be seen in patients with an atypical infectious process such as viral pneumonia. Follow-up to radiologic resolution is recommended. Consider a 4-6 week follow-up two-view chest x-ray to document stability or resolution of these findings.  2. Stable cardiac enlargement. Electronically Signed   By: Constance Holster M.D.   On: 01/31/2019 15:12

## 2019-02-05 LAB — CBC WITH DIFFERENTIAL/PLATELET
Abs Immature Granulocytes: 0.15 10*3/uL — ABNORMAL HIGH (ref 0.00–0.07)
Basophils Absolute: 0.1 10*3/uL (ref 0.0–0.1)
Basophils Relative: 1 %
Eosinophils Absolute: 0.1 10*3/uL (ref 0.0–0.5)
Eosinophils Relative: 1 %
HCT: 48.5 % (ref 39.0–52.0)
Hemoglobin: 15.5 g/dL (ref 13.0–17.0)
Immature Granulocytes: 2 %
Lymphocytes Relative: 20 %
Lymphs Abs: 1.3 10*3/uL (ref 0.7–4.0)
MCH: 28.8 pg (ref 26.0–34.0)
MCHC: 32 g/dL (ref 30.0–36.0)
MCV: 90 fL (ref 80.0–100.0)
Monocytes Absolute: 0.7 10*3/uL (ref 0.1–1.0)
Monocytes Relative: 10 %
Neutro Abs: 4.2 10*3/uL (ref 1.7–7.7)
Neutrophils Relative %: 66 %
Platelets: 330 10*3/uL (ref 150–400)
RBC: 5.39 MIL/uL (ref 4.22–5.81)
RDW: 15.9 % — ABNORMAL HIGH (ref 11.5–15.5)
WBC: 6.5 10*3/uL (ref 4.0–10.5)
nRBC: 0 % (ref 0.0–0.2)

## 2019-02-05 LAB — COMPREHENSIVE METABOLIC PANEL
ALT: 63 U/L — ABNORMAL HIGH (ref 0–44)
AST: 37 U/L (ref 15–41)
Albumin: 3.2 g/dL — ABNORMAL LOW (ref 3.5–5.0)
Alkaline Phosphatase: 84 U/L (ref 38–126)
Anion gap: 10 (ref 5–15)
BUN: 42 mg/dL — ABNORMAL HIGH (ref 8–23)
CO2: 27 mmol/L (ref 22–32)
Calcium: 9.1 mg/dL (ref 8.9–10.3)
Chloride: 104 mmol/L (ref 98–111)
Creatinine, Ser: 1.49 mg/dL — ABNORMAL HIGH (ref 0.61–1.24)
GFR calc Af Amer: 57 mL/min — ABNORMAL LOW (ref >60)
GFR calc non Af Amer: 49 mL/min — ABNORMAL LOW (ref >60)
Glucose, Bld: 122 mg/dL — ABNORMAL HIGH (ref 70–99)
Potassium: 4.5 mmol/L (ref 3.5–5.1)
Sodium: 141 mmol/L (ref 135–145)
Total Bilirubin: 1 mg/dL (ref 0.3–1.2)
Total Protein: 7.5 g/dL (ref 6.5–8.1)

## 2019-02-05 LAB — BRAIN NATRIURETIC PEPTIDE: B Natriuretic Peptide: 382.6 pg/mL — ABNORMAL HIGH (ref 0.0–100.0)

## 2019-02-05 LAB — C-REACTIVE PROTEIN: CRP: 1.2 mg/dL — ABNORMAL HIGH (ref <1.0)

## 2019-02-05 LAB — FERRITIN: Ferritin: 647 ng/mL — ABNORMAL HIGH (ref 24–336)

## 2019-02-05 LAB — LACTATE DEHYDROGENASE: LDH: 236 U/L — ABNORMAL HIGH (ref 98–192)

## 2019-02-05 LAB — MAGNESIUM: Magnesium: 1.8 mg/dL (ref 1.7–2.4)

## 2019-02-05 LAB — D-DIMER, QUANTITATIVE: D-Dimer, Quant: 0.62 ug/mL-FEU — ABNORMAL HIGH (ref 0.00–0.50)

## 2019-02-05 LAB — GLUCOSE, CAPILLARY: Glucose-Capillary: 121 mg/dL — ABNORMAL HIGH (ref 70–99)

## 2019-02-05 MED ORDER — ISOSORBIDE MONONITRATE ER 30 MG PO TB24: 30.0000 mg | ORAL_TABLET | Freq: Every day | ORAL | 0 refills | Status: DC

## 2019-02-05 MED ORDER — LEVOTHYROXINE SODIUM 50 MCG PO TABS: 50.0000 ug | ORAL_TABLET | Freq: Every day | ORAL | 0 refills | Status: DC

## 2019-02-05 MED ORDER — CARVEDILOL 3.125 MG PO TABS
3.1250 mg | ORAL_TABLET | Freq: Two times a day (BID) | ORAL | Status: DC
  Administered 2019-02-05: 3.125 mg via ORAL
  Filled 2019-02-05: qty 1

## 2019-02-05 MED ORDER — HYDRALAZINE HCL 50 MG PO TABS: 50.0000 mg | ORAL_TABLET | Freq: Three times a day (TID) | ORAL | 0 refills | Status: DC

## 2019-02-05 MED ORDER — FUROSEMIDE 40 MG PO TABS: 40.0000 mg | ORAL_TABLET | Freq: Two times a day (BID) | ORAL | 0 refills | Status: DC

## 2019-02-05 MED ORDER — CARVEDILOL 3.125 MG PO TABS: 3.1250 mg | ORAL_TABLET | Freq: Two times a day (BID) | ORAL | 0 refills | Status: DC

## 2019-02-05 NOTE — Plan of Care (Signed)
  Problem: Health Behavior/Discharge Planning: Goal: Ability to manage health-related needs will improve Outcome: Adequate for Discharge   Problem: Clinical Measurements: Goal: Ability to maintain clinical measurements within normal limits will improve Outcome: Adequate for Discharge Goal: Will remain free from infection Outcome: Adequate for Discharge Goal: Diagnostic test results will improve Outcome: Adequate for Discharge Goal: Respiratory complications will improve Outcome: Adequate for Discharge Goal: Cardiovascular complication will be avoided Outcome: Adequate for Discharge   Problem: Activity: Goal: Risk for activity intolerance will decrease Outcome: Adequate for Discharge   Problem: Nutrition: Goal: Adequate nutrition will be maintained Outcome: Adequate for Discharge   Problem: Coping: Goal: Level of anxiety will decrease Outcome: Adequate for Discharge   Problem: Elimination: Goal: Will not experience complications related to urinary retention Outcome: Adequate for Discharge   Problem: Pain Managment: Goal: General experience of comfort will improve Outcome: Adequate for Discharge   Problem: Safety: Goal: Ability to remain free from injury will improve Outcome: Adequate for Discharge   Problem: Skin Integrity: Goal: Risk for impaired skin integrity will decrease Outcome: Adequate for Discharge   Problem: Education: Goal: Knowledge of risk factors and measures for prevention of condition will improve Outcome: Adequate for Discharge   Problem: Coping: Goal: Psychosocial and spiritual needs will be supported Outcome: Adequate for Discharge   Problem: Respiratory: Goal: Will maintain a patent airway Outcome: Adequate for Discharge Goal: Complications related to the disease process, condition or treatment will be avoided or minimized Outcome: Adequate for Discharge   Problem: Clinical Measurements: Goal: Ability to maintain clinical measurements  within normal limits will improve Outcome: Adequate for Discharge Goal: Will remain free from infection Outcome: Adequate for Discharge Goal: Diagnostic test results will improve Outcome: Adequate for Discharge Goal: Respiratory complications will improve Outcome: Adequate for Discharge Goal: Cardiovascular complication will be avoided Outcome: Adequate for Discharge   Problem: Nutrition: Goal: Adequate nutrition will be maintained Outcome: Adequate for Discharge   Problem: Pain Managment: Goal: General experience of comfort will improve Outcome: Adequate for Discharge   Problem: Safety: Goal: Ability to remain free from injury will improve Outcome: Adequate for Discharge   Problem: Skin Integrity: Goal: Risk for impaired skin integrity will decrease Outcome: Adequate for Discharge

## 2019-02-05 NOTE — Progress Notes (Signed)
Discharge criteria met. Discharge teaching completed with the patient including medications, signs and symptoms to report and follow-up instructions.Questions encouraged and answered. Awaiting ride home.

## 2019-02-05 NOTE — Progress Notes (Signed)
Pt. Just had a 12 second run of wide QRS and then reverted back to SR.  V/S are:  74, 91, 97%, 129/94, 14 and is asymptomaitc.  MD page.

## 2019-02-05 NOTE — Progress Notes (Signed)
Patient discharged to home with his brother.

## 2019-02-05 NOTE — Discharge Instructions (Signed)
Follow with Primary MD [name], MD in 5 days   Get CBC, CMP, 2 view Chest X ray -  checked next visit within 1 week by Primary MD    Activity: As tolerated with Full fall precautions use walker/cane & assistance as needed  Disposition Home    Diet: Heart Healthy    Check your Weight same time everyday, if you gain over 2 pounds, or you develop in leg swelling, experience more shortness of breath or chest pain, call your Primary MD immediately. Follow Cardiac Low Salt Diet and 1.5 lit/day fluid restriction.  Special Instructions: If you have smoked or chewed Tobacco  in the last 2 yrs please stop smoking, stop any regular Alcohol  and or any Recreational drug use.  On your next visit with your primary care physician please Get Medicines reviewed and adjusted.  Please request your Prim.MD to go over all Hospital Tests and Procedure/Radiological results at the follow up, please get all Hospital records sent to your Prim MD by signing hospital release before you go home.  If you experience worsening of your admission symptoms, develop shortness of breath, life threatening emergency, suicidal or homicidal thoughts you must seek medical attention immediately by calling 911 or calling your MD immediately  if symptoms less severe.  You Must read complete instructions/literature along with all the possible adverse reactions/side effects for all the Medicines you take and that have been prescribed to you. Take any new Medicines after you have completely understood and accpet all the possible adverse reactions/side effects.       Person Under Monitoring Name: [name]  Location: [place] [address]   Infection Prevention Recommendations for Individuals Confirmed to have, or Being Evaluated for, 2019 Novel Coronavirus (COVID-19) Infection Who Receive Care at Home  Individuals who are confirmed to have, or are being evaluated for, COVID-19 should  follow the prevention steps below until a healthcare provider or local or state health department says they can return to normal activities.  Stay home except to get medical care You should restrict activities outside your home, except for getting medical care. Do not go to work, school, or public areas, and do not use public transportation or taxis.  Call ahead before visiting your doctor Before your medical appointment, call the healthcare provider and tell them that you have, or are being evaluated for, COVID-19 infection. This will help the healthcare providers office take steps to keep other people from getting infected. Ask your healthcare provider to call the local or state health department.  Monitor your symptoms Seek prompt medical attention if your illness is worsening (e.g., difficulty breathing). Before going to your medical appointment, call the healthcare provider and tell them that you have, or are being evaluated for, COVID-19 infection. Ask your healthcare provider to call the local or state health department.  Wear a facemask You should wear a facemask that covers your nose and mouth when you are in the same room with other people and when you visit a healthcare provider. People who live with or visit you should also wear a facemask while they are in the same room with you.  Separate yourself from other people in your home As much as possible, you should stay in a different room from other people in your home. Also, you should use a separate bathroom, if available.  Avoid sharing household items You should not share dishes, drinking glasses, cups, eating utensils, towels, bedding, or other  items with other people in your home. After using these items, you should wash them thoroughly with soap and water.  Cover your coughs and sneezes Cover your mouth and nose with a tissue when you cough or sneeze, or you can cough or sneeze into your sleeve. Throw used tissues in  a lined trash can, and immediately wash your hands with soap and water for at least 20 seconds or use an alcohol-based hand rub.  Wash your [facility] your hands often and thoroughly with soap and water for at least 20 seconds. You can use an alcohol-based hand sanitizer if soap and water are not available and if your hands are not visibly dirty. Avoid touching your eyes, nose, and mouth with unwashed hands.   Prevention Steps for Caregivers and Household Members of Individuals Confirmed to have, or Being Evaluated for, COVID-19 Infection Being Cared for in the Home  If you live with, or provide care at home for, a person confirmed to have, or being evaluated for, COVID-19 infection please follow these guidelines to prevent infection:  Follow healthcare providers instructions Make sure that you understand and can help the patient follow any healthcare provider instructions for all care.  Provide for the patients basic needs You should help the patient with basic needs in the home and provide support for getting groceries, prescriptions, and other personal needs.  Monitor the patients symptoms If they are getting sicker, call his or her medical provider and tell them that the patient has, or is being evaluated for, COVID-19 infection. This will help the healthcare providers office take steps to keep other people from getting infected. Ask the healthcare provider to call the local or state health department.  Limit the number of people who have contact with the patient  If possible, have only one caregiver for the patient.  Other household members should stay in another home or place of residence. If this is not possible, they should stay  in another room, or be separated from the patient as much as possible. Use a separate bathroom, if available.  Restrict visitors who do not have an essential need to be in the home.  Keep older adults, very young children, and other sick  people away from the patient Keep older adults, very young children, and those who have compromised immune systems or chronic health conditions away from the patient. This includes people with chronic heart, lung, or kidney conditions, diabetes, and cancer.  Ensure good ventilation Make sure that shared spaces in the home have good air flow, such as from an air conditioner or an opened window, weather permitting.  Wash your hands often  Wash your hands often and thoroughly with soap and water for at least 20 seconds. You can use an alcohol based hand sanitizer if soap and water are not available and if your hands are not visibly dirty.  Avoid touching your eyes, nose, and mouth with unwashed hands.  Use disposable paper towels to dry your hands. If not available, use dedicated cloth towels and replace them when they become wet.  Wear a facemask and gloves  Wear a disposable facemask at all times in the room and gloves when you touch or have contact with the patients blood, body fluids, and/or secretions or excretions, such as sweat, saliva, sputum, nasal mucus, vomit, urine, or feces.  Ensure the mask fits over your nose and mouth tightly, and do not touch it during use.  Throw out disposable facemasks and gloves after using  them. Do not reuse.  Wash your hands immediately after removing your facemask and gloves.  If your personal clothing becomes contaminated, carefully remove clothing and launder. Wash your hands after handling contaminated clothing.  Place all used disposable facemasks, gloves, and other waste in a lined container before disposing them with other household waste.  Remove gloves and wash your hands immediately after handling these items.  Do not share dishes, glasses, or other household items with the patient  Avoid sharing household items. You should not share dishes, drinking glasses, cups, eating utensils, towels, bedding, or other items with a patient who is  confirmed to have, or being evaluated for, COVID-19 infection.  After the person uses these items, you should wash them thoroughly with soap and water.  Wash laundry thoroughly  Immediately remove and wash clothes or bedding that have blood, body fluids, and/or secretions or excretions, such as sweat, saliva, sputum, nasal mucus, vomit, urine, or feces, on them.  Wear gloves when handling laundry from the patient.  Read and follow directions on labels of laundry or clothing items and detergent. In general, wash and dry with the warmest temperatures recommended on the label.  Clean all areas the individual has used often  Clean all touchable surfaces, such as counters, tabletops, doorknobs, bathroom fixtures, toilets, phones, keyboards, tablets, and bedside tables, every day. Also, clean any surfaces that may have blood, body fluids, and/or secretions or excretions on them.  Wear gloves when cleaning surfaces the patient has come in contact with.  Use a diluted bleach solution (e.g., dilute bleach with 1 part bleach and 10 parts water) or a household disinfectant with a label that says EPA-registered for coronaviruses. To make a bleach solution at home, add 1 tablespoon of bleach to 1 quart (4 cups) of water. For a larger supply, add  cup of bleach to 1 gallon (16 cups) of water.  Read labels of cleaning products and follow recommendations provided on product labels. Labels contain instructions for safe and effective use of the cleaning product including precautions you should take when applying the product, such as wearing gloves or eye protection and making sure you have good ventilation during use of the product.  Remove gloves and wash hands immediately after cleaning.  Monitor yourself for signs and symptoms of illness Caregivers and household members are considered close contacts, should monitor their health, and will be asked to limit movement outside of the home to the extent  possible. Follow the monitoring steps for close contacts listed on the symptom monitoring form.   ? If you have additional questions, contact your local health department or call the epidemiologist on call at [phone] (available 24/7). ? This guidance is subject to change. For the most up-to-date guidance from [facility], please refer to their website: [email or web address]               Information on my medicine - Pradaxa (dabigatran)  This medication education was reviewed with me or my healthcare representative as part of my discharge preparation.  The pharmacist that spoke with me during my hospital stay was:  Onnie Boer, RPH-CPP  Why was Pradaxa prescribed for you? Pradaxa was prescribed for you to reduce the risk of forming blood clots that cause a stroke if you have a medical condition called atrial fibrillation (a type of irregular heartbeat).    What do you Need to know about PradAXa? Take your Pradaxa TWICE DAILY - one capsule in the morning and one tablet in the evening  with or without food.  It would be best to take the doses about the same time each day.  The capsules should not be broken, chewed or opened - they must be swallowed whole.  Do not store Pradaxa in other medication containers - once the bottle is opened the Pradaxa should be used within FOUR months; throw away any capsules that havent been by that time.  Take Pradaxa exactly as prescribed by your doctor.  DO NOT stop taking Pradaxa without talking to the doctor who prescribed the medication.  Stopping without other stroke prevention medication to take the place of Pradaxa may increase your risk of developing a clot that causes a stroke.  Refill your prescription before you run out.  After discharge, you should have regular check-up appointments with your healthcare provider that is prescribing your Pradaxa.  In the future your dose may need to  be changed if your kidney function or weight changes by a significant amount.  What do you do if you miss a dose? If you miss a dose, take it as soon as you remember on the same day.  If your next dose is less than 6 hours away, skip the missed dose.  Do not take two doses of PRADAXA at the same time.  Important Safety Information A possible side effect of Pradaxa is bleeding. You should call your healthcare provider right away if you experience any of the following: ? Bleeding from an injury or your nose that does not stop. ? Unusual colored urine (red or dark brown) or unusual colored stools (red or black). ? Unusual bruising for unknown reasons. ? A serious fall or if you hit your head (even if there is no bleeding).  Some medicines may interact with Pradaxa and might increase your risk of bleeding or clotting while on Pradaxa. To help avoid this, consult your healthcare provider or pharmacist prior to using any new prescription or non-prescription medications, including herbals, vitamins, non-steroidal anti-inflammatory drugs (NSAIDs) and supplements.  This website has more information on Pradaxa (dabigatran): [email or web address]://[email or web address].[email or web address]

## 2019-02-05 NOTE — Discharge Summary (Signed)
[name] [id number] DOB: Jun 12, 1956 DOA: 01/31/2019  PCP: [name], MD  Admit date: 01/31/2019  Discharge date: 02/05/2019  Admitted From: Home   Disposition:  Home   Recommendations for Outpatient Follow-up:   Follow up with PCP in 1-2 weeks  PCP Please obtain BMP/CBC, 2 view CXR in 1week,  (see Discharge instructions)   PCP Please follow up on the following pending results: Follow BMP closely   Home Health: PT,RN   Equipment/Devices: None  Consultation - Dr Allred Discharge Condition: Stable    CODE STATUS: Full    Diet Recommendation: Heart Healthy     Chief Complaint  Patient presents with   Dizziness     Brief history of present illness from the day of admission and additional interim summary    [name] Marinelarena Websteris a 63 y.o.malewith medical history significant ofatrial fibrillation/flutter flutter status post cardioversion on Pradaxa, systolic CHF ejection fraction 30%, essential hypertension, diabetes mellitus type 2, hyperlipidemia, peripheral neuropathy came to the hospital with complains of dizziness and weakness for the past 2 weeks which is worsened over the last week.  In the ER he was found to be bradycardic, hyperkalemic, in AKI and was incidentally COVID-19 positive.  No pulmonary symptoms.                                                                 Hospital Course    1. Mild Covid 19 Viral Pneumonitis during the ongoing 2020 Covid 19 Pandemic - he is relatively symptom-free, afebrile no cough or shortness of breath monitor inflammatory markers closely.  For now no intervention.  2.  Symptomatic bradycardia with heart rate in mid 40s - multi factorial likely due to combination of hyperkalemia, high-dose beta-blocker and elevated TSH.  Hyperkalemia has been  corrected, beta-blocker held, heart rate is better.  His TSH is hard to interpret due to him being on amiodarone.  We were his TSH free T4 and T3 were all low which could suggest central hypothyroidism for now since he was bradycardic I will place him on Synthroid with outpatient PCP and endocrine follow-up.  3.  Life-threatening hyperkalemia and ARF.  Held ARB, Aldactone and potassium supplementation.  Hyperkalemia protocol was followed on 02/01/2019 with good success, repeated Kayexalate and low-dose oral Lasix on 02/02/2019 and now potassium is stable.  4.  Chronic systolic heart failure last EF 35% on echocardiogram done in 12/04/2018.  Currently compensated.  ARB and were beta-blocker held due to hyperkalemia and bradycardia and ARF, amiodarone continued on 02/02/2019, case discussed with patient's primary cardiologist Dr. Rayann Heman who agrees with the plan on 02/02/2019. Now on Low dose Coreg + Hydralazine + Imdur + Coreg + Aldactone.  5.  Weakness.  Due to #2  above.  resolved with PT and supportive Rx.  6.  Dyslipidemia.  On statin.  7.  AKI on CKD 3 due to combination of dehydration and ARB.  Offending medications - resolved after IVF.   8.  Paroxysmal atrial fibrillation/atrial flutter.  Mali vas 2 score of greater than 3.  On Pradaxa, rate controlling medications are Amio + Coreg (low dose) adjust as needed.     9.  CAD with underlying ischemic cardiomyopathy.  No acute issues.  Statin continued for secondary prevention.  Beta-blocker will be resumed once heart rate is stabilized.  10.  Essential hypertension - stable on present Rx as below, PCP to follow, ARB stooped for ARF and Coreg cut for severe Bradycardia.  11.  History of right lower extremity arterial ulcer.  Continue local wound care thereafter outpatient wound care follow-up as before.   12. DM type II.  Home Rx.  A1c was stable.   Discharge diagnosis     Principal Problem:   Symptomatic bradycardia Active  Problems:   Hypothyroidism   Hyperlipidemia associated with type 2 diabetes mellitus (HCC)   ATRIAL FIBRILLATION   Chronic systolic heart failure (HCC)   Sleep apnea   Type 2 diabetes mellitus with hyperlipidemia (HCC)   Diabetic ulcer of calf associated with type 2 diabetes mellitus, with fat layer exposed ([place])   COVID-19 virus infection   AKI (acute kidney injury) ([place])   Dizziness    Discharge instructions    Discharge Instructions    Discharge instructions   Complete by: As directed    Follow with Primary MD [name], MD in 5 days   Get CBC, CMP, 2 view Chest X ray -  checked next visit within 1 week by Primary MD    Activity: As tolerated with Full fall precautions use walker/cane & assistance as needed  Disposition Home    Diet: Heart Healthy    Check your Weight same time everyday, if you gain over 2 pounds, or you develop in leg swelling, experience more shortness of breath or chest pain, call your Primary MD immediately. Follow Cardiac Low Salt Diet and 1.5 lit/day fluid restriction.  Special Instructions: If you have smoked or chewed Tobacco  in the last 2 yrs please stop smoking, stop any regular Alcohol  and or any Recreational drug use.  On your next visit with your primary care physician please Get Medicines reviewed and adjusted.  Please request your Prim.MD to go over all Hospital Tests and Procedure/Radiological results at the follow up, please get all Hospital records sent to your Prim MD by signing hospital release before you go home.  If you experience worsening of your admission symptoms, develop shortness of breath, life threatening emergency, suicidal or homicidal thoughts you must seek medical attention immediately by calling 911 or calling your MD immediately  if symptoms less severe.  You Must read complete instructions/literature along with all the possible adverse reactions/side effects for all the Medicines you take and that have been  prescribed to you. Take any new Medicines after you have completely understood and accpet all the possible adverse reactions/side effects.   Increase activity slowly   Complete by: As directed       Discharge Medications   Allergies as of 02/05/2019   No Known Allergies     Medication List    STOP taking these medications   Klor-Con M20 20 MEQ tablet Generic drug: potassium chloride SA   valsartan 320 MG tablet Commonly known as: DIOVAN     TAKE these medications  albuterol 108 (90 Base) MCG/ACT inhaler Commonly known as: VENTOLIN HFA Inhale 2 puffs into the lungs every 6 (six) hours as needed for wheezing or shortness of breath.   amiodarone 200 MG tablet Commonly known as: PACERONE Take 1 tablet (200 mg total) by mouth daily.   carvedilol 3.125 MG tablet Commonly known as: COREG Take 1 tablet (3.125 mg total) by mouth 2 (two) times daily with a meal. What changed:   medication strength  how much to take   dabigatran 150 MG Caps capsule Commonly known as: Pradaxa Take 1 capsule (150 mg total) by mouth 2 (two) times daily.   doxylamine (Sleep) 25 MG tablet Commonly known as: UNISOM Take 50 mg by mouth at bedtime as needed for sleep.   furosemide 40 MG tablet Commonly known as: LASIX Take 1 tablet (40 mg total) by mouth 2 (two) times daily. What changed:   medication strength  See the new instructions.   gabapentin 300 MG capsule Commonly known as: NEURONTIN TAKE 1 CAPSULE BY MOUTH THREE TIMES A DAY What changed: See the new instructions.   glipiZIDE 5 MG tablet Commonly known as: GLUCOTROL TAKE 1 TABLET BY MOUTH TWICE A DAY BEFORE MEALS/ NEED APPOINTMENT What changed:   how much to take  how to take this  when to take this  additional instructions   hydrALAZINE 50 MG tablet Commonly known as: APRESOLINE Take 1 tablet (50 mg total) by mouth every 8 (eight) hours.   isosorbide mononitrate 30 MG 24 hr tablet Commonly known as: IMDUR Take 1  tablet (30 mg total) by mouth daily.   levothyroxine 50 MCG tablet Commonly known as: SYNTHROID Take 1 tablet (50 mcg total) by mouth daily before breakfast. Start taking on: February 06, 2019   mometasone 50 MCG/ACT nasal spray Commonly known as: NASONEX Place 2 sprays into the nose daily as needed (allergies or rhinitis).   rosuvastatin 10 MG tablet Commonly known as: CRESTOR TAKE 1 TABLET BY MOUTH EVERY DAY What changed: when to take this   sildenafil 100 MG tablet Commonly known as: VIAGRA Take 1 tablet (100 mg total) by mouth as directed. What changed:   when to take this  reasons to take this   spironolactone 25 MG tablet Commonly known as: ALDACTONE Take 25 mg by mouth daily.   Symbicort 80-4.5 MCG/ACT inhaler Generic drug: budesonide-formoterol USE 2 INHALATIONS TWICE A DAY What changed: See the new instructions.   vitamin C 500 MG tablet Commonly known as: ASCORBIC ACID Take 500-1,000 mg by mouth daily.   ZINC PO Take 1 tablet by mouth daily with supper.       Follow-up Information    [name], MD. Schedule an appointment as soon as possible for a visit in 5 day(s).   Specialty: Internal Medicine Contact information: [place] [address] [phone]        Buford Dresser, MD. Schedule an appointment as soon as possible for a visit in 5 day(s).   Specialty: Cardiology Contact information: [address] [place] [address] [phone]        Thompson Grayer, MD. Schedule an appointment as soon as possible for a visit in 1 week(s).   Specialty: Cardiology Contact information: [place] [address] [phone]        Renato Shin, MD. Schedule an appointment as soon as possible for a visit in 1 week(s).   Specialty: Endocrinology Why: Hypothyroidism Contact information: [phone]  [place] [address] [phone]            Major procedures and Radiology Reports - PLEASE review detailed and final reports thoroughly  -        Ct Head Wo Contrast  Result Date: 01/31/2019 CLINICAL DATA:  63 year old male with vertigo, dizziness and multiple falls. EXAM: CT HEAD WITHOUT CONTRAST TECHNIQUE: Contiguous axial images were obtained from the base of the skull through the vertex without intravenous contrast. COMPARISON:  None. FINDINGS: Brain: No evidence of acute infarction, hemorrhage, hydrocephalus, extra-axial collection or mass lesion/mass effect. Atrophy and mild to moderate white matter hypodensities are noted, likely representing chronic small-vessel white matter ischemic changes. Vascular: Carotid and vertebral atherosclerotic calcifications are noted. Skull: Normal. Negative for fracture or focal lesion. Sinuses/Orbits: A small amount of fluid and mucosal thickening within both maxillary sinuses noted. Other: None IMPRESSION: 1. No evidence of acute intracranial abnormality. 2. Atrophy and mild to moderate probable chronic small-vessel white matter ischemic changes. 3. Small amount of fluid and mucosal thickening within both maxillary sinuses suggesting acute on chronic sinusitis. Electronically Signed   By: Margarette Canada M.D.   On: 01/31/2019 17:27   Dg Chest Port 1 View  Result Date: 01/31/2019 CLINICAL DATA:  Weakness EXAM: PORTABLE CHEST 1 VIEW COMPARISON:  08/10/2010 FINDINGS: Heart size is enlarged. Aortic calcifications are noted. There is scattered mild airspace opacities bilaterally, for example in the right upper lobe and at the left lung base. There is no pneumothorax. No large pleural effusion. There is no acute osseous abnormality. IMPRESSION: 1. Scattered airspace opacities bilaterally. These are nonspecific but can be seen in patients with an atypical infectious process such as viral pneumonia. Follow-up to radiologic resolution is recommended. Consider a 4-6 week follow-up two-view chest x-ray to document  stability or resolution of these findings. 2. Stable cardiac enlargement. Electronically Signed   By: Constance Holster M.D.   On: 01/31/2019 15:12    Micro Results     Recent Results (from the past 240 hour(s))  SARS Coronavirus 2 [facility] order, Performed in [facility] hospital lab) Nasopharyngeal Nasopharyngeal Swab     Status: Abnormal   Collection Time: 01/31/19  2:49 PM   Specimen: Nasopharyngeal Swab  Result Value Ref Range Status   SARS Coronavirus 2 POSITIVE (A) NEGATIVE Final    Comment: RESULT CALLED TO, READ BACK BY AND VERIFIED WITHRobynn Pane RN 1556 01/31/19 A BROWNING (NOTE) If result is NEGATIVE SARS-CoV-2 target nucleic acids are NOT DETECTED. The SARS-CoV-2 RNA is generally detectable in upper and lower  respiratory specimens during the acute phase of infection. The lowest  concentration of SARS-CoV-2 viral copies this assay can detect is 250  copies / mL. A negative result does not preclude SARS-CoV-2 infection  and should not be used as the sole basis for treatment or other  patient management decisions.  A negative result may occur with  improper specimen collection / handling, submission of specimen other  than nasopharyngeal swab, presence of viral mutation(s) within the  areas targeted by this assay, and inadequate number of viral copies  (<250 copies / mL). A negative result must be combined with clinical  observations, patient history, and epidemiological information. If result is POSITIVE SARS-CoV-2 target nucleic acids are DETECTED.  The SARS-CoV-2 RNA is generally detectable in upper and lower  respiratory specimens during the acute phase of infection.  Positive  results are indicative of active infection with SARS-CoV-2.  Clinical  correlation with  patient history and other diagnostic information is  necessary to determine patient infection status.  Positive results do  not rule out bacterial infection or co-infection with other viruses. If  result is PRESUMPTIVE POSTIVE SARS-CoV-2 nucleic acids MAY BE PRESENT.   A presumptive positive result was obtained on the submitted specimen  and confirmed on repeat testing.  While 2019 novel coronavirus  (SARS-CoV-2) nucleic acids may be present in the submitted sample  additional confirmatory testing may be necessary for epidemiological  and / or clinical management purposes  to differentiate between  SARS-CoV-2 and other Sarbecovirus currently known to infect humans.  If clinically indicated additional testing with an alternate test  methodology [id number])  is advised. The SARS-CoV-2 RNA is generally  detectable in upper and lower respiratory specimens during the acute  phase of infection. The expected result is Negative. Fact Sheet for Patients:  StrictlyIdeas.no Fact Sheet for Healthcare Providers: BankingDealers.co.za This test is not yet approved or cleared by the [place] FDA and has been authorized for detection and/or diagnosis of SARS-CoV-2 by FDA under an Emergency Use Authorization (EUA).  This EUA will remain in effect (meaning this test can be used) for the duration of the COVID-19 declaration under Section 564(b)(1) of the Act, 21 U.S.C. section 360bbb-3(b)(1), unless the authorization is terminated or revoked sooner. Performed at [facility], [place] [address], [place], [place] [address]     Today   Subjective    [name] today has no headache,no chest abdominal pain,no new weakness tingling or numbness, feels much better wants to go home today.     Objective   Blood pressure (!) 129/94, pulse 75, temperature 97.7 F (36.5 C), temperature source Oral, resp. rate 11, height 6\' 1"  (1.854 m), weight 123.4 kg, SpO2 97 %.   Intake/Output Summary (Last 24 hours) at 02/05/2019 0909 Last data filed at 02/05/2019 0439 Gross per 24 hour  Intake 240 ml  Output 2 ml  Net 238 ml    Exam Awake Alert,  Oriented x 3, No new F.N deficits, Normal affect [place].AT,PERRAL Supple Neck,No JVD, No cervical lymphadenopathy appriciated.  Symmetrical Chest wall movement, Good air movement bilaterally, CTAB RRR,No Gallops,Rubs or new Murmurs, No Parasternal Heave +ve B.Sounds, Abd Soft, Non tender, No organomegaly appriciated, No rebound -guarding or rigidity. No Cyanosis, Clubbing or edema, L. Leg wound stable under bandage   Data Review   CBC w Diff:  Lab Results  Component Value Date   WBC 6.5 02/05/2019   HGB 15.5 02/05/2019   HGB 16.4 11/24/2018   HCT 48.5 02/05/2019   HCT 50.8 11/24/2018   PLT 330 02/05/2019   PLT 196 11/24/2018   LYMPHOPCT 20 02/05/2019   MONOPCT 10 02/05/2019   EOSPCT 1 02/05/2019   BASOPCT 1 02/05/2019    CMP:  Lab Results  Component Value Date   NA 141 02/05/2019   NA 142 12/28/2018   K 4.5 02/05/2019   CL 104 02/05/2019   CO2 27 02/05/2019   BUN 42 (H) 02/05/2019   BUN 29 (H) 12/28/2018   CREATININE 1.49 (H) 02/05/2019   PROT 7.5 02/05/2019   PROT 7.2 05/03/2018   ALBUMIN 3.2 (L) 02/05/2019   ALBUMIN 4.2 05/03/2018   BILITOT 1.0 02/05/2019   BILITOT 1.0 05/03/2018   ALKPHOS 84 02/05/2019   AST 37 02/05/2019   ALT 63 (H) 02/05/2019  .  COVID-19 Labs  Recent Labs    02/03/19 0400 02/04/19 0428 02/05/19 0228  DDIMER 0.55* 0.58*  0.62*  FERRITIN 758* 698* 647*  LDH 251* 243* 236*  CRP 2.7* 1.7* 1.2*    Lab Results  Component Value Date   SARSCOV2NAA POSITIVE (A) 01/31/2019   SARSCOV2NAA POSITIVE (A) 01/15/2019   [place] NEGATIVE 12/10/2018     Total Time in preparing paper work, data evaluation and todays exam - 80 minutes  Lala Lund M.D on 02/05/2019 at 9:09 AM  [facility]   Office  [phone]

## 2019-02-07 ENCOUNTER — Telehealth: Payer: Self-pay

## 2019-02-07 NOTE — Telephone Encounter (Signed)
Open in error

## 2019-02-09 ENCOUNTER — Ambulatory Visit: Payer: BLUE CROSS/BLUE SHIELD | Admitting: Adult Health

## 2019-02-14 ENCOUNTER — Inpatient Hospital Stay: Payer: BLUE CROSS/BLUE SHIELD | Admitting: Internal Medicine

## 2019-02-14 DIAGNOSIS — E11622 Type 2 diabetes mellitus with other skin ulcer: Secondary | ICD-10-CM | POA: Diagnosis not present

## 2019-02-14 DIAGNOSIS — S81801A Unspecified open wound, right lower leg, initial encounter: Secondary | ICD-10-CM | POA: Diagnosis not present

## 2019-02-14 DIAGNOSIS — W19XXXA Unspecified fall, initial encounter: Secondary | ICD-10-CM | POA: Diagnosis not present

## 2019-02-14 DIAGNOSIS — Z9181 History of falling: Secondary | ICD-10-CM | POA: Diagnosis not present

## 2019-02-14 DIAGNOSIS — S81001A Unspecified open wound, right knee, initial encounter: Secondary | ICD-10-CM | POA: Diagnosis not present

## 2019-02-14 DIAGNOSIS — I87312 Chronic venous hypertension (idiopathic) with ulcer of left lower extremity: Secondary | ICD-10-CM | POA: Diagnosis not present

## 2019-02-14 DIAGNOSIS — Z8619 Personal history of other infectious and parasitic diseases: Secondary | ICD-10-CM | POA: Diagnosis not present

## 2019-02-14 DIAGNOSIS — L97829 Non-pressure chronic ulcer of other part of left lower leg with unspecified severity: Secondary | ICD-10-CM | POA: Diagnosis not present

## 2019-02-14 DIAGNOSIS — I110 Hypertensive heart disease with heart failure: Secondary | ICD-10-CM | POA: Diagnosis not present

## 2019-02-14 DIAGNOSIS — I5022 Chronic systolic (congestive) heart failure: Secondary | ICD-10-CM | POA: Diagnosis not present

## 2019-02-14 DIAGNOSIS — S81012A Laceration without foreign body, left knee, initial encounter: Secondary | ICD-10-CM | POA: Diagnosis not present

## 2019-02-14 DIAGNOSIS — I87331 Chronic venous hypertension (idiopathic) with ulcer and inflammation of right lower extremity: Secondary | ICD-10-CM | POA: Diagnosis not present

## 2019-02-14 DIAGNOSIS — L97819 Non-pressure chronic ulcer of other part of right lower leg with unspecified severity: Secondary | ICD-10-CM | POA: Diagnosis not present

## 2019-02-14 DIAGNOSIS — L97212 Non-pressure chronic ulcer of right calf with fat layer exposed: Secondary | ICD-10-CM | POA: Diagnosis not present

## 2019-02-14 DIAGNOSIS — S81011A Laceration without foreign body, right knee, initial encounter: Secondary | ICD-10-CM | POA: Diagnosis not present

## 2019-02-14 DIAGNOSIS — G4730 Sleep apnea, unspecified: Secondary | ICD-10-CM | POA: Diagnosis not present

## 2019-02-15 DIAGNOSIS — L97919 Non-pressure chronic ulcer of unspecified part of right lower leg with unspecified severity: Secondary | ICD-10-CM | POA: Diagnosis not present

## 2019-02-15 NOTE — Progress Notes (Signed)
Cardiology Office Note Date:  02/16/2019  Patient ID:  Charistopher, Shaut 1955/11/18, MRN [id number] PCP:  [name], MD  Cardiologist:  Dr. Harrell Gave Electrophysiologist: Dr. Rayann Heman    Chief Complaint: post hospital follow up    History of Present Illness: EFTHIMIOS THANE is a 63 y.o. male with history of AFlutter (ablated 2012) >> persistent AFib, venous stasis complicated by leg wound requiring venous ablation and skin grafting (June 2020), HTN, HLD, obesity, NICM, chronic CHF, sleep apnea w/CPAP, DM.  He was planned for AFib ablation 12/14/2018, unfortunately TEE found marked smoke, LAA pre thrombus and likely thrombus dense immobile sludge in LAA and his procedure canceled.  Dr. Rayann Heman noting his V rate was controlled and not likely the cause of his CM, recommended aggressive management of his CM, should consider amyloid, alcohol, or hypertension as causes.  The patient saw Dr. Harrell Gave 12/28/2018 in follow up, planned for LHC to evaluate for CAD as etiology, though on 01/15/2019 his COIVID test was positive and cath postponed.  He was admitted to [facility] 8/17 with c/o dizziness, weakness, no symptoms of illness, no fever, cough, SOB, though on 8/17 he remained COVID positive. He was found bradycardic HR 40s, in ARF Creat 3.9, hyperkalemic at 6.2 He was not felt to be decompensated with his chronic CHF, and not symptomatic with COVID + TSH was elevated at 15.  His ARB, amio were stopped, his coreg dose reduced, his hyperkalemia corrected.  He was described as remaining relatively symptom frewe from [place] and no intervention was needed. Discharge summary mentions bradycardia felt multifactorial with hyperkalemia and meds, and in d/w Dr. Rayann Heman, his ARB and amio initially held though resumed..., to remain on low dose coreg, hydralazine, imdur, and aldactone. He was also started on synthroid for hypothyroid to f/u with his PMD (T3 and free T4 slightly low) Last K+ 4.5, Creat  1.49 Discharged on 02/05/2019 to f/u with PMD to follow BMET/CBC, CXR CRP was elevated through trended down at discharge near normal at 1.2 D dimer mildly elevated through his stay   He feels night and day better. Regarding his COVID + test, he never had any kind of symptoms of illness, no chills, fever, no cough, SOB of any kind, no notable symptoms whatsoever.    The days leading up to his hospital stay he was getting progressively weak, falling, he reports his brother brought him to the hospital after 3 days of this and eventually apparently lethargic. He denies any kind of CP, palpations.  No noted syncope, but falls  Since leaving the hospital he has remained feeling well, his weight stable, and without symptoms.   Past Medical History:  Diagnosis Date  . Allergy    SEASONAL  . Asthma   . Atrial fibrillation ([place])    persistant 02/2009  . Atrial flutter ([place])    s/p CTI ablation 08/06/10  . Cardiac arrhythmia due to congenital heart disease   . Cataract    BEGINING  . Chronic rhinitis   . Chronic systolic dysfunction of left ventricle   . Colon polyp   . Congestive heart failure ([place])   . Diverticulosis    colonoscopy 04/03/2009  . Hyperlipidemia   . Hypertension   . Hypertension   . Morbid obesity ([place])    target weight = 219  for BMI < 30  . Nonischemic cardiomyopathy (HCC)    tachycardia mediated  . Sleep apnea    original 2001    Past Surgical  History:  Procedure Laterality Date  . ATRIAL FIBRILLATION ABLATION N/A 12/14/2018   Procedure: ATRIAL FIBRILLATION ABLATION;  Surgeon: Thompson Grayer, MD;  Location: [place] [facility];  Service: Cardiovascular;  Laterality: N/A;  . atrial flutter ablation  08/06/10  . CARDIOVERSION N/A 07/05/2018   Procedure: CARDIOVERSION;  Surgeon: Buford Dresser, MD;  Location: [facility] ENDOSCOPY;  Service: Cardiovascular;  Laterality: N/A;  . COLONOSCOPY    . TEE WITHOUT CARDIOVERSION N/A 12/14/2018   Procedure: TRANSESOPHAGEAL  ECHOCARDIOGRAM (TEE);  Surgeon: Thompson Grayer, MD;  Location: [place] [facility];  Service: Cardiovascular;  Laterality: N/A;    Current Outpatient Medications  Medication Sig Dispense Refill  . albuterol (PROVENTIL HFA;VENTOLIN HFA) 108 (90 Base) MCG/ACT inhaler Inhale 2 puffs into the lungs every 6 (six) hours as needed for wheezing or shortness of breath.    [name] amiodarone (PACERONE) 200 MG tablet Take 1 tablet (200 mg total) by mouth daily. 180 tablet 3  . carvedilol (COREG) 3.125 MG tablet Take 1 tablet (3.125 mg total) by mouth 2 (two) times daily with a meal. 60 tablet 0  . dabigatran (PRADAXA) 150 MG CAPS capsule Take 1 capsule (150 mg total) by mouth 2 (two) times daily. 60 capsule 6  . doxylamine, Sleep, (UNISOM) 25 MG tablet Take 50 mg by mouth at bedtime as needed for sleep.     . furosemide (LASIX) 40 MG tablet Take 1 tablet (40 mg total) by mouth 2 (two) times daily. 60 tablet 0  . gabapentin (NEURONTIN) 300 MG capsule TAKE 1 CAPSULE BY MOUTH THREE TIMES A DAY (Patient taking differently: Take 300 mg by mouth 2 (two) times daily. ) 270 capsule 2  . glipiZIDE (GLUCOTROL) 5 MG tablet TAKE 1 TABLET BY MOUTH TWICE A DAY BEFORE MEALS/ NEED APPOINTMENT (Patient taking differently: Take 5 mg by mouth 2 (two) times a day. ) 60 tablet 0  . hydrALAZINE (APRESOLINE) 50 MG tablet Take 1 tablet (50 mg total) by mouth every 8 (eight) hours. 90 tablet 0  . isosorbide mononitrate (IMDUR) 30 MG 24 hr tablet Take 1 tablet (30 mg total) by mouth daily. 30 tablet 0  . levothyroxine (SYNTHROID) 50 MCG tablet Take 1 tablet (50 mcg total) by mouth daily before breakfast. 30 tablet 0  . mometasone (NASONEX) 50 MCG/ACT nasal spray Place 2 sprays into the nose daily as needed (allergies or rhinitis).     . Multiple Vitamins-Minerals (ZINC PO) Take 1 tablet by mouth daily with supper.    . rosuvastatin (CRESTOR) 10 MG tablet TAKE 1 TABLET BY MOUTH EVERY DAY (Patient taking differently: Take 10 mg by mouth at  bedtime. ) 90 tablet 0  . sildenafil (VIAGRA) 100 MG tablet Take 1 tablet (100 mg total) by mouth as directed. (Patient taking differently: Take 100 mg by mouth as needed for erectile dysfunction. ) 15 tablet 3  . spironolactone (ALDACTONE) 25 MG tablet Take 25 mg by mouth daily.    . SYMBICORT 80-4.5 MCG/ACT inhaler USE 2 INHALATIONS TWICE A DAY (Patient taking differently: Inhale 2 puffs into the lungs 2 (two) times daily as needed (shortness of breath). ) 30.6 g 1  . vitamin C (ASCORBIC ACID) 500 MG tablet Take 500-1,000 mg by mouth daily.     No current facility-administered medications for this visit.     Allergies:   Patient has no known allergies.   Social History:  The patient  reports that he has never smoked. He has never used smokeless tobacco. He reports current  alcohol use. He reports that he does not use drugs.   Family History:  The patient's family history includes COPD in his father; Colon cancer in his brother; Colon cancer (age of onset: 61) in his father; Hypertension in his mother; Kidney failure in his mother.  ROS:  Please see the history of present illness.  All other systems are reviewed and otherwise negative.   PHYSICAL EXAM: VS:  BP (!) 142/92   Pulse 63   Ht 6\' 1"  (1.854 m)   Wt 283 lb (128.4 kg)   BMI 37.34 kg/m  BMI: Body mass index is 37.34 kg/m. Well nourished, well developed, in no acute distress  HEENT: normocephalic, atraumatic  Neck: no JVD, carotid bruits or masses Cardiac:  RRR; no significant murmurs, no rubs, or gallops Lungs:  CTA b/l, no wheezing, rhonchi or rales  Abd: soft, nontender MS: no deformity or atrophy Ext: no pitting edema, he has a number of dressing on b/l LE, notable significant varicose veins b/l Skin: warm and dry, no rash Neuro:  No gross deficits appreciated Psych: euthymic mood, full affect     EKG:  Done today and reviewed b y myselfshows SB 51bpm, IVCD, 1st degree AVBlock, PR 260ms, LAD (appears similar to  pre-hospital EKG)   TTE 05/03/18 Left ventricle: The cavity size was normal. There was severe concentric hypertrophy. Systolic function was moderately to severely reduced. The estimated ejection fraction was in the range of 30% to 35%. Diffuse hypokinesis. The study was not technically sufficient to allow evaluation of LV diastolic dysfunction due to atrial fibrillation. - Aortic valve: There was no regurgitation. - Aortic root: The aortic root was normal in size. - Mitral valve: There was trivial regurgitation. - Left atrium: The atrium was severely dilated. - Right ventricle: Systolic function was normal. - Right atrium: The atrium was moderately dilated. - Tricuspid valve: There was trivial regurgitation. - Pulmonic valve: Transvalvular velocity was within the normal range. There was no evidence for stenosis. - Pulmonary arteries: Systolic pressure was within the normal range. - Inferior vena cava: The vessel was normal in size. The respirophasic diameter changes were in the normal range (= 50%), consistent with normal central venous pressure. - Pericardium, extracardiac: There was no pericardial effusion.  Impressions: - When compared to the prior study from 04/02/2017 LVEF has decreased from 55-60% to 30-35% with diffuse hypokinesis.  TEE 12/14/18 1. The left ventricle has moderate-severely reduced systolic function, with an ejection fraction of 30-35%. The cavity size was moderately dilated. Left ventrical global hypokinesis without regional wall motion abnormalities. 2. The right ventricle has normal systolc function. The cavity was normal. There is no increase in right ventricular wall thickness. 3. Left atrial size was severely dilated. 4. Dense smoke in atria and appendage. Stagnant pre thrombus and likely thrombus in mid to distal LAA High risk for embolic event EP procedure cancelled. 5. Right atrial size was moderately dilated. 6. The  mitral valve is grossly normal. Mild thickening of the mitral valve leaflet. Mild calcification of the mitral valve leaflet. Mitral valve regurgitation is mild to moderate by color flow Doppler. 7. Restricted posterior leaflet motion. 8. The aortic valve is tricuspid Moderate thickening of the aortic valve Sclerosis without any evidence of stenosis of the aortic valve. Aortic valve regurgitation is trivial by color flow Doppler. 9. The aortic root is normal in size and structure.   Recent Labs: 02/02/2019: TSH 13.910 02/05/2019: ALT 63; B Natriuretic Peptide 382.6; BUN 42; Creatinine, Ser 1.49; Hemoglobin  15.5; Magnesium 1.8; Platelets 330; Potassium 4.5; Sodium 141  No results found for requested labs within last 8760 hours.   Estimated Creatinine Clearance: 70.3 mL/min (A) (by C-G formula based on SCr of 1.49 mg/dL (H)).   Wt Readings from Last 3 Encounters:  02/16/19 283 lb (128.4 kg)  02/03/19 272 lb 0.8 oz (123.4 kg)  01/11/19 285 lb (129.3 kg)     Other studies reviewed: Additional studies/records reviewed today include: summarized above  ASSESSMENT AND PLAN:  1. Longstanding persistent Afib     CHA2DS2Vasc is 3, on Pradaxa, appropriately dosed (Calc CrCl by last Creat in hospital with in-hospital weight is 87)     Denies any bleeding or signs of bleeding, and reports compliance  He has maintained SR since 12/15/2018 as far as I can tell  2. Bradycardia     better on lowered coreg dose, remains on amiodarone, also was markedly hypokalemic     IVCD today looks similar to old (in hospital had RBBB)     No changes today  3. AKI, hyperkalemia     Better at discharge     BMET/CBC today as recommended on discharge  4. CM 5. Chronic CHF     Suspect to be NICM, though cath cancelled for + COVD     Off ARB, K+,  with ARF and hyperkalemia     No symptoms or exam findings of volume OL currently, reports his weight stable at home  I wonder if it may be helpful to repeat his  echo maintaining SR a couple months. He is scheduled to see Dr. Harrell Gave next week, I think keep his visit with her to decided when to re-visit cath s/p AKI I will reach out to her to see if she would like the echo before her visit if possible.     Disposition: BMET and CBC today as recommended on his discharge, he reports he sees PMD on 9/8 and planned for recommended CXR there.      Current medicines are reviewed at length with the patient today.  The patient did not have any concerns regarding medicines.  Venetia Night, PA-C 02/16/2019 2:43 PM     [place] [address] [phone] (office)  [phone] (fax)

## 2019-02-16 ENCOUNTER — Other Ambulatory Visit: Payer: Self-pay

## 2019-02-16 ENCOUNTER — Encounter: Payer: Self-pay | Admitting: Physician Assistant

## 2019-02-16 ENCOUNTER — Ambulatory Visit (INDEPENDENT_AMBULATORY_CARE_PROVIDER_SITE_OTHER): Payer: BLUE CROSS/BLUE SHIELD | Admitting: Physician Assistant

## 2019-02-16 VITALS — BP 142/92 | HR 63 | Ht 73.0 in | Wt 283.0 lb

## 2019-02-16 DIAGNOSIS — I5022 Chronic systolic (congestive) heart failure: Secondary | ICD-10-CM | POA: Diagnosis not present

## 2019-02-16 DIAGNOSIS — I428 Other cardiomyopathies: Secondary | ICD-10-CM

## 2019-02-16 DIAGNOSIS — I4819 Other persistent atrial fibrillation: Secondary | ICD-10-CM

## 2019-02-16 DIAGNOSIS — Z09 Encounter for follow-up examination after completed treatment for conditions other than malignant neoplasm: Secondary | ICD-10-CM | POA: Diagnosis not present

## 2019-02-16 NOTE — Patient Instructions (Signed)
Medication Instructions:  Your physician recommends that you continue on your current medications as directed. Please refer to the Current Medication list given to you today.  If you need a refill on your cardiac medications before your next appointment, please call your pharmacy.   Lab work:  BMET AND CBC   If you have labs (blood work) drawn today and your tests are completely normal, you will receive your results only by: [name] MyChart Message (if you have MyChart) OR . A paper copy in the mail If you have any lab test that is abnormal or we need to change your treatment, we will call you to review the results.  Testing/Procedures: NONE ORDERED  TODAY  Follow-Up:  AS SCHEDULED   Any Other Special Instructions Will Be Listed Below (If Applicable).

## 2019-02-17 LAB — BASIC METABOLIC PANEL
BUN/Creatinine Ratio: 15 (ref 10–24)
BUN: 21 mg/dL (ref 8–27)
CO2: 22 mmol/L (ref 20–29)
Calcium: 9.2 mg/dL (ref 8.6–10.2)
Chloride: 97 mmol/L (ref 96–106)
Creatinine, Ser: 1.39 mg/dL — ABNORMAL HIGH (ref 0.76–1.27)
GFR calc Af Amer: 61 mL/min/{1.73_m2} (ref >59)
GFR calc non Af Amer: 53 mL/min/{1.73_m2} — ABNORMAL LOW (ref >59)
Glucose: 117 mg/dL — ABNORMAL HIGH (ref 65–99)
Potassium: 4.5 mmol/L (ref 3.5–5.2)
Sodium: 137 mmol/L (ref 134–144)

## 2019-02-17 LAB — CBC
Hematocrit: 46.5 % (ref 37.5–51.0)
Hemoglobin: 15.3 g/dL (ref 13.0–17.7)
MCH: 28.9 pg (ref 26.6–33.0)
MCHC: 32.9 g/dL (ref 31.5–35.7)
MCV: 88 fL (ref 79–97)
Platelets: 153 10*3/uL (ref 150–450)
RBC: 5.3 x10E6/uL (ref 4.14–5.80)
RDW: 16.3 % — ABNORMAL HIGH (ref 11.6–15.4)
WBC: 7.9 10*3/uL (ref 3.4–10.8)

## 2019-02-21 NOTE — Patient Instructions (Addendum)
Have a chest xray today - we will call you with the results.       Medications reviewed and updated.  Changes include :  none

## 2019-02-21 NOTE — Progress Notes (Signed)
Subjective:    Patient ID: [name], male    DOB: 10-13-1955, 63 y.o.   MRN: [name]  HPI The patient is here for follow up from the hospital.   Admitted 01/31/19 -02/05/2019    for dizziness.  Follow up with PCP in 1-2 weeks  PCP Please obtain BMP/CBC, 2 view CXR in 1week,     He has a significant history for atrial fibrillation/flutter status post cardioversion on Pradaxa, systolic CHF with an EF of 30%, hypertension, diabetes, hyperlipidemia, peripheral neuropathy that was experiencing dizziness and generalized weakness for 2 weeks, which had worsened over the past week prior to admission.  He was supposed to have further evaluation of his decreased EF earlier last month, but tested positive for cocaine-19.  He denied fevers, chills, shortness of breath, cough, abdominal pain and nausea.  In the ED his heart rate was in the 40s.  EKG showed first-degree AV block.  Blood work showed acute kidney injury with a creatinine of 3.9, up from baseline of 1.7.  COVID still positive in the ED.  He did not have any symptoms with COVID at any time.    Symptomatic bradycardia, heart rate in 40s: Multifactorial likely due to hyperkalemia, high-dose beta-blocker and elevated TSH Hyperkalemia corrected Beta-blocker held Heart rate improved TSH difficult to interpret secondary to him being on amiodarone-blood work suggested central hypothyroidism Since bradycardic he was placed on Synthroid  Life-threatening hyperkalemia and ARF ARB held, spironolactone held, potassium supplementation held Kayexalate, low-dose oral Lasix given Potassium is stable  Chronic systolic heart failure, EF 35% 6/20: Compensated in hospital ARB, beta-blocker held due to hyperkalemia and bradycardia and ARF Amiodarone continued Dr. Rayann Heman was consulted and he will follow-up with him as an outpatient Converted to low-dose Coreg, hydralazine, Imdur and Aldactone  Generalized weakness: Secondary to  bradycardia Resolved with PT and improvement in heart rate  Dyslipidemia Continue on statin  AKI on CKD 3: Due to combination of dehydration and ARB ARB held, improved with IVF  Paroxysmal atrial fibrillation/flutter: On Pradaxa Amiodarone, Coreg  CAD, underlying ischemia: Stable On statin Coreg  Hypertension: Stable with current medications  Right lower extremity arterial ulcer: Local wound care, outpatient wound care follow-up  Diabetes, type II: Continued on home medications A1c stable   He feels good and has no concerns.   He has seen cardiology already and had blood work.  His kidney function has improved.  He is drinking as much water as he can, but is not a fan of water.   He denies any fever, cough, sob and wheeze.  He had an abnormal CXR that needs to be repeated.  He did have COVID but was asymptomatic when he had it.    He has follow up with the wound center.    Medications and allergies reviewed with patient and updated if appropriate.  Patient Active Problem List   Diagnosis Date Noted  . COVID-19 virus infection 01/31/2019  . Symptomatic bradycardia 01/31/2019  . AKI (acute kidney injury) ([place]) 01/31/2019  . Dizziness 01/31/2019  . Diabetic ulcer of calf associated with type 2 diabetes mellitus, with fat layer exposed ([place]) 05/20/2018  . Type 2 diabetes mellitus with hyperlipidemia ([place]) 09/09/2016  . History of colon polyps 05/17/2014  . Routine general medical examination at a health care facility 05/08/2014  . Atrial flutter ([place]) 07/12/2010  . Hypothyroidism 03/22/2010  . Chronic systolic heart failure ([place]) 03/28/2009  . ATRIAL FIBRILLATION 03/09/2009  . Asthma 01/25/2008  .  Hyperlipidemia associated with type 2 diabetes mellitus ([place]) 01/24/2008  . MORBID OBESITY 01/24/2008  . Essential hypertension 01/24/2008  . Sleep apnea 01/24/2008    Current Outpatient Medications on File Prior to Visit  Medication Sig Dispense Refill  . albuterol  (PROVENTIL HFA;VENTOLIN HFA) 108 (90 Base) MCG/ACT inhaler Inhale 2 puffs into the lungs every 6 (six) hours as needed for wheezing or shortness of breath.    [name] amiodarone (PACERONE) 200 MG tablet Take 1 tablet (200 mg total) by mouth daily. 180 tablet 3  . carvedilol (COREG) 3.125 MG tablet Take 1 tablet (3.125 mg total) by mouth 2 (two) times daily with a meal. 60 tablet 0  . dabigatran (PRADAXA) 150 MG CAPS capsule Take 1 capsule (150 mg total) by mouth 2 (two) times daily. 60 capsule 6  . doxylamine, Sleep, (UNISOM) 25 MG tablet Take 50 mg by mouth at bedtime as needed for sleep.     . furosemide (LASIX) 40 MG tablet Take 1 tablet (40 mg total) by mouth 2 (two) times daily. 60 tablet 0  . gabapentin (NEURONTIN) 300 MG capsule TAKE 1 CAPSULE BY MOUTH THREE TIMES A DAY 270 capsule 2  . glipiZIDE (GLUCOTROL) 5 MG tablet TAKE 1 TABLET BY MOUTH TWICE A DAY BEFORE MEALS/ NEED APPOINTMENT 60 tablet 0  . hydrALAZINE (APRESOLINE) 50 MG tablet Take 1 tablet (50 mg total) by mouth every 8 (eight) hours. 90 tablet 0  . isosorbide mononitrate (IMDUR) 30 MG 24 hr tablet Take 1 tablet (30 mg total) by mouth daily. 30 tablet 0  . levothyroxine (SYNTHROID) 50 MCG tablet Take 1 tablet (50 mcg total) by mouth daily before breakfast. 30 tablet 0  . mometasone (NASONEX) 50 MCG/ACT nasal spray Place 2 sprays into the nose daily as needed (allergies or rhinitis).     . Multiple Vitamins-Minerals (ZINC PO) Take 1 tablet by mouth daily with supper.    . rosuvastatin (CRESTOR) 10 MG tablet TAKE 1 TABLET BY MOUTH EVERY DAY 90 tablet 0  . sildenafil (VIAGRA) 100 MG tablet Take 1 tablet (100 mg total) by mouth as directed. 15 tablet 3  . spironolactone (ALDACTONE) 25 MG tablet Take 25 mg by mouth daily.    . SYMBICORT 80-4.5 MCG/ACT inhaler USE 2 INHALATIONS TWICE A DAY 30.6 g 1  . vitamin C (ASCORBIC ACID) 500 MG tablet Take 500-1,000 mg by mouth daily.     No current facility-administered medications on file prior to  visit.     Past Medical History:  Diagnosis Date  . Allergy    SEASONAL  . Asthma   . Atrial fibrillation ([place])    persistant 02/2009  . Atrial flutter ([place])    s/p CTI ablation 08/06/10  . Cardiac arrhythmia due to congenital heart disease   . Cataract    BEGINING  . Chronic rhinitis   . Chronic systolic dysfunction of left ventricle   . Colon polyp   . Congestive heart failure ([place])   . Diverticulosis    colonoscopy 04/03/2009  . Hyperlipidemia   . Hypertension   . Hypertension   . Morbid obesity ([place])    target weight = 219  for BMI < 30  . Nonischemic cardiomyopathy ([facility])    tachycardia mediated  . Sleep apnea    original 2001    Past Surgical History:  Procedure Laterality Date  . ATRIAL FIBRILLATION ABLATION N/A 12/14/2018   Procedure: ATRIAL FIBRILLATION ABLATION;  Surgeon: Thompson Grayer, MD;  Location: [place] [facility];  Service: Cardiovascular;  Laterality: N/A;  . atrial flutter ablation  08/06/10  . CARDIOVERSION N/A 07/05/2018   Procedure: CARDIOVERSION;  Surgeon: Buford Dresser, MD;  Location: [facility] ENDOSCOPY;  Service: Cardiovascular;  Laterality: N/A;  . COLONOSCOPY    . TEE WITHOUT CARDIOVERSION N/A 12/14/2018   Procedure: TRANSESOPHAGEAL ECHOCARDIOGRAM (TEE);  Surgeon: Thompson Grayer, MD;  Location: [place] [facility];  Service: Cardiovascular;  Laterality: N/A;    Social History   Socioeconomic History  . Marital status: Single    Spouse name: Not on file  . Number of children: 0  . Years of education: Not on file  . Highest education level: Not on file  Occupational History  . Occupation: Press photographer  Social Needs  . Financial resource strain: Not on file  . Food insecurity    Worry: Not on file    Inability: Not on file  . Transportation needs    Medical: Not on file    Non-medical: Not on file  Tobacco Use  . Smoking status: Never Smoker  . Smokeless tobacco: Never Used  Substance and Sexual Activity  . Alcohol use: Yes     Alcohol/week: 0.0 standard drinks    Comment: previously quite heavy, working on cessation   . Drug use: No  . Sexual activity: Not on file  Lifestyle  . Physical activity    Days per week: Not on file    Minutes per session: Not on file  . Stress: Not on file  Relationships  . Social Herbalist on phone: Not on file    Gets together: Not on file    Attends religious service: Not on file    Active member of club or organization: Not on file    Attends meetings of clubs or organizations: Not on file    Relationship status: Not on file  Other Topics Concern  . Not on file  Social History Narrative   Lives in [place] and works for [name]    Family History  Problem Relation Age of Onset  . Hypertension Mother   . Kidney failure Mother        Due to sepsis  . Colon cancer Father 2  . COPD Father   . Colon cancer Brother   . Colon polyps Neg Hx   . Diabetes Neg Hx   . Kidney disease Neg Hx   . Esophageal cancer Neg Hx     Review of Systems  Constitutional: Negative for chills and fever.  Respiratory: Negative for cough, shortness of breath and wheezing.   Cardiovascular: Negative for chest pain, palpitations and leg swelling.  Neurological: Negative for light-headedness and headaches.       Objective:   Vitals:   02/22/19 1300  BP: 130/78  Pulse: (!) 58  Resp: 18  Temp: 98.5 F (36.9 C)  SpO2: 98%   BP Readings from Last 3 Encounters:  02/22/19 130/78  02/22/19 122/76  02/16/19 (!) 142/92   Wt Readings from Last 3 Encounters:  02/22/19 282 lb (127.9 kg)  02/22/19 281 lb 9.6 oz (127.7 kg)  02/16/19 283 lb (128.4 kg)   Body mass index is 37.21 kg/m.   Physical Exam    Constitutional: Appears well-developed and well-nourished. No distress.  HENT:  Head: Normocephalic and atraumatic.  Neck: Neck supple. No tracheal deviation present. No thyromegaly present.  No cervical lymphadenopathy Cardiovascular: Normal rate, regular rhythm and  normal heart sounds.  No murmur heard. No carotid bruit .  Trace b/l LE edema Pulmonary/Chest: Effort normal and breath sounds normal. No respiratory distress. No has no wheezes. No rales.  Skin: Skin is warm and dry. Not diaphoretic. Several wounds on b/l legs Psychiatric: Normal mood and affect. Behavior is normal.      Assessment & Plan:    See Problem List for Assessment and Plan of chronic medical problems.

## 2019-02-22 ENCOUNTER — Ambulatory Visit (INDEPENDENT_AMBULATORY_CARE_PROVIDER_SITE_OTHER)
Admission: RE | Admit: 2019-02-22 | Discharge: 2019-02-22 | Disposition: A | Discharge status: Home / Self Care | Payer: BLUE CROSS/BLUE SHIELD | Source: Ambulatory Visit | Attending: Internal Medicine | Admitting: Internal Medicine

## 2019-02-22 ENCOUNTER — Encounter: Payer: Self-pay | Admitting: Cardiology

## 2019-02-22 ENCOUNTER — Other Ambulatory Visit: Payer: Self-pay

## 2019-02-22 ENCOUNTER — Ambulatory Visit (INDEPENDENT_AMBULATORY_CARE_PROVIDER_SITE_OTHER): Payer: BLUE CROSS/BLUE SHIELD | Admitting: Internal Medicine

## 2019-02-22 ENCOUNTER — Encounter: Payer: Self-pay | Admitting: Internal Medicine

## 2019-02-22 ENCOUNTER — Ambulatory Visit: Payer: BLUE CROSS/BLUE SHIELD | Admitting: Cardiology

## 2019-02-22 ENCOUNTER — Other Ambulatory Visit: Payer: Self-pay | Admitting: Internal Medicine

## 2019-02-22 VITALS — BP 130/78 | HR 58 | Temp 98.5°F | Resp 18 | Ht 73.0 in | Wt 282.0 lb

## 2019-02-22 VITALS — BP 122/76 | HR 80 | Temp 97.7°F | Ht 73.0 in | Wt 281.6 lb

## 2019-02-22 DIAGNOSIS — I4811 Longstanding persistent atrial fibrillation: Secondary | ICD-10-CM | POA: Diagnosis not present

## 2019-02-22 DIAGNOSIS — E039 Hypothyroidism, unspecified: Secondary | ICD-10-CM | POA: Diagnosis not present

## 2019-02-22 DIAGNOSIS — N179 Acute kidney failure, unspecified: Secondary | ICD-10-CM | POA: Diagnosis not present

## 2019-02-22 DIAGNOSIS — E1169 Type 2 diabetes mellitus with other specified complication: Secondary | ICD-10-CM | POA: Diagnosis not present

## 2019-02-22 DIAGNOSIS — I5022 Chronic systolic (congestive) heart failure: Secondary | ICD-10-CM

## 2019-02-22 DIAGNOSIS — R9389 Abnormal findings on diagnostic imaging of other specified body structures: Secondary | ICD-10-CM | POA: Diagnosis not present

## 2019-02-22 DIAGNOSIS — E785 Hyperlipidemia, unspecified: Secondary | ICD-10-CM

## 2019-02-22 DIAGNOSIS — Z8619 Personal history of other infectious and parasitic diseases: Secondary | ICD-10-CM

## 2019-02-22 DIAGNOSIS — U071 COVID-19: Secondary | ICD-10-CM | POA: Diagnosis not present

## 2019-02-22 DIAGNOSIS — I10 Essential (primary) hypertension: Secondary | ICD-10-CM

## 2019-02-22 DIAGNOSIS — I110 Hypertensive heart disease with heart failure: Secondary | ICD-10-CM | POA: Diagnosis not present

## 2019-02-22 DIAGNOSIS — M40204 Unspecified kyphosis, thoracic region: Secondary | ICD-10-CM | POA: Diagnosis not present

## 2019-02-22 DIAGNOSIS — Z8616 Personal history of COVID-19: Secondary | ICD-10-CM | POA: Insufficient documentation

## 2019-02-22 DIAGNOSIS — I700 Atherosclerosis of aorta: Secondary | ICD-10-CM | POA: Diagnosis not present

## 2019-02-22 DIAGNOSIS — I420 Dilated cardiomyopathy: Secondary | ICD-10-CM

## 2019-02-22 NOTE — Patient Instructions (Signed)

## 2019-02-22 NOTE — Assessment & Plan Note (Signed)
Restarted on levothyroxine 01/2019 in hospital Amiodarone induced Too early to check tsh right now - will need this done in the next few weeks.

## 2019-02-22 NOTE — Progress Notes (Signed)
Cardiology Office Note:    Date:  02/22/2019   ID:  [name], DOB Feb 16, 1956, MRN [name]  PCP:  [name], MD  Cardiologist:  Buford Dresser, MD PhD  Referring MD: [name], *   CC: post hospitalization follow up.  History of Present Illness:    [name] is a 63 y.o. male with a hx of persistent atrial fibrillation, atrial flutter with prior ablation in 2012, prior cardioversions, HTN, chronic systolic heart failure (thought to be tachycardia induced) with EF 30-35% on tte 04/2018, OSA who is seen in follow up today. I first saw him as a new consult 12/28/18.  Cardiac history: 12/14/18 Was planned for afib ablation. However, TEE performed was concerning for thrombus. Also noted to have EF 30%, severe biatrial enlargement. 04/2018: TTE, EF 30-35%, diffuse hypokinesis, biatrial enlargement  Cardiovascular risk factors: Prior clinical ASCVD: no known ASCVD, no prior cath Comorbid conditions, including hypertension, hyperlipidemia, diabetes, chronic kidney disease: yes: HTN, DM, HLD. No formal CKD, but Cr 2.11, had been 1.5-1.6  Today:  Reviewed recent events. Incidental Covid+ test prior to cath on 01/17/19 (see my note). Denied any symptoms at the time. Admitted 01/31/19 after gradually worsening dizziness/weakness. Found to have AKI with Cr 3.9, K 6.2 with bradycardia/AV block. Not felt to be 2/2 COVID. ARB/MRA held. TSH was 15, supplementation started.   Today feeling back to baseline. No symptoms of URI at all. Unclear trigger for his presentation--describes slow progression into fatigue/weakness. He has not noticed any afib recently. No shortness of breath. No LE edema since discharge. Weights stable.   Denies chest pain, shortness of breath at rest or with normal exertion. No PND, orthopnea, LE edema or unexpected weight gain. No syncope or palpitations.  We discussed prior plans for cath, see plan below.    Past Medical History:   Diagnosis Date  . Allergy    SEASONAL  . Asthma   . Atrial fibrillation ([place])    persistant 02/2009  . Atrial flutter ([place])    s/p CTI ablation 08/06/10  . Cardiac arrhythmia due to congenital heart disease   . Cataract    BEGINING  . Chronic rhinitis   . Chronic systolic dysfunction of left ventricle   . Colon polyp   . Congestive heart failure ([place])   . Diverticulosis    colonoscopy 04/03/2009  . Hyperlipidemia   . Hypertension   . Hypertension   . Morbid obesity ([place])    target weight = 219  for BMI < 30  . Nonischemic cardiomyopathy (H[facility])    tachycardia mediated  . Sleep apnea    original 2001    Past Surgical History:  Procedure Laterality Date  . ATRIAL FIBRILLATION ABLATION N/A 12/14/2018   Procedure: ATRIAL FIBRILLATION ABLATION;  Surgeon: Thompson Grayer, MD;  Location: [place] [facility];  Service: Cardiovascular;  Laterality: N/A;  . atrial flutter ablation  08/06/10  . CARDIOVERSION N/A 07/05/2018   Procedure: CARDIOVERSION;  Surgeon: Buford Dresser, MD;  Location: [facility];  Service: Cardiovascular;  Laterality: N/A;  . COLONOSCOPY    . TEE WITHOUT CARDIOVERSION N/A 12/14/2018   Procedure: TRANSESOPHAGEAL ECHOCARDIOGRAM (TEE);  Surgeon: Thompson Grayer, MD;  Location: [place] [facility];  Service: Cardiovascular;  Laterality: N/A;    Current Medications: Current Outpatient Medications on File Prior to Visit  Medication Sig  . albuterol (PROVENTIL HFA;VENTOLIN HFA) 108 (90 Base) MCG/ACT inhaler Inhale 2 puffs into the lungs every 6 (six) hours as needed for  wheezing or shortness of breath.  [name] amiodarone (PACERONE) 200 MG tablet Take 1 tablet (200 mg total) by mouth daily.  . carvedilol (COREG) 3.125 MG tablet Take 1 tablet (3.125 mg total) by mouth 2 (two) times daily with a meal.  . dabigatran (PRADAXA) 150 MG CAPS capsule Take 1 capsule (150 mg total) by mouth 2 (two) times daily.  [name] doxylamine, Sleep, (UNISOM) 25 MG tablet Take 50 mg by mouth at  bedtime as needed for sleep.   . furosemide (LASIX) 40 MG tablet Take 1 tablet (40 mg total) by mouth 2 (two) times daily.  [name] gabapentin (NEURONTIN) 300 MG capsule TAKE 1 CAPSULE BY MOUTH THREE TIMES A DAY  . glipiZIDE (GLUCOTROL) 5 MG tablet TAKE 1 TABLET BY MOUTH TWICE A DAY BEFORE MEALS/ NEED APPOINTMENT  . hydrALAZINE (APRESOLINE) 50 MG tablet Take 1 tablet (50 mg total) by mouth every 8 (eight) hours.  . isosorbide mononitrate (IMDUR) 30 MG 24 hr tablet Take 1 tablet (30 mg total) by mouth daily.  [name] levothyroxine (SYNTHROID) 50 MCG tablet Take 1 tablet (50 mcg total) by mouth daily before breakfast.  . mometasone (NASONEX) 50 MCG/ACT nasal spray Place 2 sprays into the nose daily as needed (allergies or rhinitis).   . Multiple Vitamins-Minerals (ZINC PO) Take 1 tablet by mouth daily with supper.  . rosuvastatin (CRESTOR) 10 MG tablet TAKE 1 TABLET BY MOUTH EVERY DAY  . sildenafil (VIAGRA) 100 MG tablet Take 1 tablet (100 mg total) by mouth as directed.  [name] spironolactone (ALDACTONE) 25 MG tablet Take 25 mg by mouth daily.  . SYMBICORT 80-4.5 MCG/ACT inhaler USE 2 INHALATIONS TWICE A DAY  . vitamin C (ASCORBIC ACID) 500 MG tablet Take 500-1,000 mg by mouth daily.   No current facility-administered medications on file prior to visit.      Allergies:   Patient has no known allergies.   Social History   Socioeconomic History  . Marital status: Single    Spouse name: Not on file  . Number of children: 0  . Years of education: Not on file  . Highest education level: Not on file  Occupational History  . Occupation: Press photographer  Social Needs  . Financial resource strain: Not on file  . Food insecurity    Worry: Not on file    Inability: Not on file  . Transportation needs    Medical: Not on file    Non-medical: Not on file  Tobacco Use  . Smoking status: Never Smoker  . Smokeless tobacco: Never Used  Substance and Sexual Activity  . Alcohol use: Yes    Alcohol/week: 0.0 standard  drinks    Comment: previously quite heavy, working on cessation   . Drug use: No  . Sexual activity: Not on file  Lifestyle  . Physical activity    Days per week: Not on file    Minutes per session: Not on file  . Stress: Not on file  Relationships  . Social Herbalist on phone: Not on file    Gets together: Not on file    Attends religious service: Not on file    Active member of club or organization: Not on file    Attends meetings of clubs or organizations: Not on file    Relationship status: Not on file  Other Topics Concern  . Not on file  Social History Narrative   Lives in [place] and works for [name]     Family History:  The patient's family history includes COPD in his father; Colon cancer in his brother; Colon cancer (age of onset: 51) in his father; Hypertension in his mother; Kidney failure in his mother. There is no history of Colon polyps, Diabetes, Kidney disease, or Esophageal cancer.  ROS:   Please see the history of present illness.  Additional pertinent ROS:  Constitutional: Negative for chills, fever, night sweats, unintentional weight loss  HENT: Negative for ear pain and hearing loss.   Eyes: Negative for loss of vision and eye pain.  Respiratory: Negative for cough, sputum, wheezing.   Cardiovascular: See HPI. Gastrointestinal: Negative for abdominal pain, melena, and hematochezia.  Genitourinary: Negative for dysuria and hematuria.  Musculoskeletal: Negative for falls and myalgias.  Skin: Negative for itching and rash.  Neurological: Negative for focal weakness, focal sensory changes and loss of consciousness.  Endo/Heme/Allergies: Does not bruise/bleed easily.    EKGs/Labs/Other Studies Reviewed:    The following studies were reviewed today: TTE May 08, 2018 Left ventricle: The cavity size was normal. There was severe   concentric hypertrophy. Systolic function was moderately to   severely reduced. The estimated ejection fraction  was in the   range of 30% to 35%. Diffuse hypokinesis. The study was not   technically sufficient to allow evaluation of LV diastolic   dysfunction due to atrial fibrillation. - Aortic valve: There was no regurgitation. - Aortic root: The aortic root was normal in size. - Mitral valve: There was trivial regurgitation. - Left atrium: The atrium was severely dilated. - Right ventricle: Systolic function was normal. - Right atrium: The atrium was moderately dilated. - Tricuspid valve: There was trivial regurgitation. - Pulmonic valve: Transvalvular velocity was within the normal   range. There was no evidence for stenosis. - Pulmonary arteries: Systolic pressure was within the normal   range. - Inferior vena cava: The vessel was normal in size. The   respirophasic diameter changes were in the normal range (= 50%),   consistent with normal central venous pressure. - Pericardium, extracardiac: There was no pericardial effusion.  Impressions: - When compared to the prior study from 04/02/2017 LVEF has   decreased from 55-60% to 30-35% with diffuse hypokinesis.  TEE 12/14/18  1. The left ventricle has moderate-severely reduced systolic function, with an ejection fraction of 30-35%. The cavity size was moderately dilated. Left ventrical global hypokinesis without regional wall motion abnormalities.  2. The right ventricle has normal systolc function. The cavity was normal. There is no increase in right ventricular wall thickness.  3. Left atrial size was severely dilated.  4. Dense smoke in atria and appendage. Stagnant pre thrombus and likely thrombus in mid to distal LAA High risk for embolic event EP procedure cancelled.  5. Right atrial size was moderately dilated.  6. The mitral valve is grossly normal. Mild thickening of the mitral valve leaflet. Mild calcification of the mitral valve leaflet. Mitral valve regurgitation is mild to moderate by color flow Doppler.  7. Restricted posterior  leaflet motion.  8. The aortic valve is tricuspid Moderate thickening of the aortic valve Sclerosis without any evidence of stenosis of the aortic valve. Aortic valve regurgitation is trivial by color flow Doppler.  9. The aortic root is normal in size and structure.  EKG:  EKG is personally reviewed.  The ekg ordered 12/15/18 demonstrates afib, RBBB, LAFB  Recent Labs: 02/02/2019: TSH 13.910 02/05/2019: ALT 63; B Natriuretic Peptide 382.6; Magnesium 1.8 02/16/2019: BUN 21; Creatinine, Ser 1.39; Hemoglobin 15.3; Platelets 153; Potassium 4.5;  Sodium 137  Recent Lipid Panel    Component Value Date/Time   CHOL 148 11/26/2017 0829   TRIG 92.0 11/26/2017 0829   HDL 60.60 11/26/2017 0829   CHOLHDL 2 11/26/2017 0829   VLDL 18.4 11/26/2017 0829   LDLCALC 69 11/26/2017 0829   LDLDIRECT 81.0 08/31/2015 0905    Physical Exam:    VS:  BP 122/76   Pulse 80   Temp 97.7 F (36.5 C)   Ht 6\' 1"  (1.854 m)   Wt 281 lb 9.6 oz (127.7 kg)   SpO2 95%   BMI 37.15 kg/m     Wt Readings from Last 3 Encounters:  02/22/19 282 lb (127.9 kg)  02/22/19 281 lb 9.6 oz (127.7 kg)  02/16/19 283 lb (128.4 kg)    GEN: Well nourished, well developed in no acute distress HEENT: Normal, moist mucous membranes NECK: No JVD CARDIAC: regular rhythm, normal S1 and S2, no murmurs, rubs, gallops.  VASCULAR: Radial and DP pulses 2+ bilaterally. No carotid bruits RESPIRATORY:  Clear to auscultation without rales, wheezing or rhonchi  ABDOMEN: Soft, non-tender, non-distended MUSCULOSKELETAL:  Ambulates independently SKIN: Warm. Multiple areas of discoloration/eschar across bilateral lower extremities, most covered with bandages (following in wound clinic). Trace bilateral brawny LE edema. NEUROLOGIC:  Alert and oriented x 3. No focal neuro deficits noted. PSYCHIATRIC:  Normal affect   ASSESSMENT:    1. Longstanding persistent atrial fibrillation   2. Chronic systolic heart failure ([place])   3. Essential hypertension    4. History of 2019 novel coronavirus disease (COVID-19)   5. Dilated cardiomyopathy (HCC)    PLAN:    Dilated cardiomyopathy, unknown etiology, with chronic systolic and diastolic heart failure:  Euvolemic and stable today. Had planned for [facility] to determine if there is an ischemic component. However, with recent severe AKI and hyperkalemia, would prefer not to risk contrast nephropathy at this time -will continue to monitor symptoms. If he continues to do well, will repeat echo at follow up visit. If still abnormal, will need to readdress R/LHC. Given body habitus, lexiscan not ideal but may be alternative (would do at [facility]) -tolerating low dose carvedilol. Will hold on uptitrating given recent bradycardia -changed to hydralazine (only taking BID) and imdur during recent hospitalization given AKI. Spironolactone continued at discharge, need to monitor closely. K 4.5, Cr 1.39 on 9/2 -no ACEI/ARB/ARNI given AKI and hyperkalemia -changed to BID furosemide at discharge, tolerating -recheck BMET at follow up  History of atrial fibrillation and atrial flutter: CHADSVASC=3 S/p CTI ablation 2012 Has OSA, encouraged adherence to CPAP Continue amiodarone. Levothyroxine added during recent hospitalization Continue dabigatran Ablation aborted due to thrombus in LAA.  Bradycardia/1st degree AV block with AKI and hyperkalemia, resolved  Hypertension: Goal <130/80, at goal today -continue carvedilol, hydralazine-imdur, furosemide  COVID-19 positive:  No significant respiratory symptoms, but unclear what precipitated AKI and hyperkalemia  Cardiac risk counseling and prevention recommendations: -recommend heart healthy/Mediterranean diet, with whole grains, fruits, vegetable, fish, lean meats, nuts, and olive oil. Limit salt. -recommend moderate walking, 3-5 times/week for 30-50 minutes each session. Aim for at least 150 minutes.week. Goal should be pace of 3 miles/hours, or walking 1.5 miles  in 30 minutes -recommend avoidance of tobacco products. Avoid excess alcohol. -continue rosuvastatin given diabetes, ASCVD risk. -ASCVD risk score: The 10-year ASCVD risk score Mikey Bussing DC Jr., et al., 2013) is: 18.6%   Values used to calculate the score:     Age: 65 years     Sex: Male  Is Non-Hispanic African American: No     Diabetic: Yes     Tobacco smoker: No     Systolic Blood Pressure: [id number]     Is BP treated: Yes     HDL Cholesterol: 60.6 mg/dL     Total Cholesterol: 148 mg/dL    Plan for follow up: 4 weeks  Medication Adjustments/Labs and Tests Ordered: Current medicines are reviewed at length with the patient today.  Concerns regarding medicines are outlined above.  No orders of the defined types were placed in this encounter.  No orders of the defined types were placed in this encounter.   Patient Instructions  Medication Instructions:  Your Physician recommend you continue on your current medication as directed.    If you need a refill on your cardiac medications before your next appointment, please call your pharmacy.   Lab work: None  Testing/Procedures: None  Follow-Up: At [facility], you and your health needs are our priority.  As part of our continuing mission to provide you with exceptional heart care, we have created designated Provider Care Teams.  These Care Teams include your primary Cardiologist (physician) and Advanced Practice Providers (APPs -  Physician Assistants and Nurse Practitioners) who all work together to provide you with the care you need, when you need it. You will need a follow up appointment in 4 weeks.  Please call our office 2 months in advance to schedule this appointment.  You may see Buford Dresser, MD or one of the following Advanced Practice Providers on your designated Care Team:   Rosaria Ferries, PA-C . Jory Sims, DNP, ANP      Signed, Buford Dresser, MD PhD 02/22/2019  [place]

## 2019-02-22 NOTE — Assessment & Plan Note (Signed)
Lab Results  Component Value Date   HGBA1C 6.7 (H) 01/31/2019    Controlled Continue current medications

## 2019-02-22 NOTE — Assessment & Plan Note (Signed)
Rate controlled, asymptomatic On pradaxa, coreg Recently saw cardiology

## 2019-02-22 NOTE — Assessment & Plan Note (Addendum)
euvolemic on exam Following with cardiology

## 2019-02-22 NOTE — Assessment & Plan Note (Signed)
While in hosp 01/2019 - ? Related to COVID - was asymptomatic Will recheck CXR today Currently no cough, wheeze, sob or fever

## 2019-02-22 NOTE — Assessment & Plan Note (Signed)
Improved - checked by cardiology Encouraged increased water He will have his GFR rechecked by cardiology at his next appt per patient

## 2019-02-24 ENCOUNTER — Encounter (HOSPITAL_BASED_OUTPATIENT_CLINIC_OR_DEPARTMENT_OTHER): Payer: BLUE CROSS/BLUE SHIELD | Attending: Internal Medicine

## 2019-02-24 DIAGNOSIS — L97822 Non-pressure chronic ulcer of other part of left lower leg with fat layer exposed: Secondary | ICD-10-CM | POA: Insufficient documentation

## 2019-02-24 DIAGNOSIS — I87313 Chronic venous hypertension (idiopathic) with ulcer of bilateral lower extremity: Secondary | ICD-10-CM | POA: Diagnosis not present

## 2019-02-24 DIAGNOSIS — G4730 Sleep apnea, unspecified: Secondary | ICD-10-CM | POA: Diagnosis not present

## 2019-02-24 DIAGNOSIS — L97212 Non-pressure chronic ulcer of right calf with fat layer exposed: Secondary | ICD-10-CM | POA: Insufficient documentation

## 2019-02-24 DIAGNOSIS — L97812 Non-pressure chronic ulcer of other part of right lower leg with fat layer exposed: Secondary | ICD-10-CM | POA: Insufficient documentation

## 2019-02-24 DIAGNOSIS — I509 Heart failure, unspecified: Secondary | ICD-10-CM | POA: Diagnosis not present

## 2019-02-24 DIAGNOSIS — I110 Hypertensive heart disease with heart failure: Secondary | ICD-10-CM | POA: Insufficient documentation

## 2019-02-24 DIAGNOSIS — E119 Type 2 diabetes mellitus without complications: Secondary | ICD-10-CM | POA: Diagnosis not present

## 2019-03-02 DIAGNOSIS — G4730 Sleep apnea, unspecified: Secondary | ICD-10-CM | POA: Diagnosis not present

## 2019-03-02 DIAGNOSIS — G4733 Obstructive sleep apnea (adult) (pediatric): Secondary | ICD-10-CM | POA: Diagnosis not present

## 2019-03-03 DIAGNOSIS — L97812 Non-pressure chronic ulcer of other part of right lower leg with fat layer exposed: Secondary | ICD-10-CM | POA: Diagnosis not present

## 2019-03-03 DIAGNOSIS — L97821 Non-pressure chronic ulcer of other part of left lower leg limited to breakdown of skin: Secondary | ICD-10-CM | POA: Diagnosis not present

## 2019-03-03 DIAGNOSIS — E11622 Type 2 diabetes mellitus with other skin ulcer: Secondary | ICD-10-CM | POA: Diagnosis not present

## 2019-03-03 DIAGNOSIS — I87313 Chronic venous hypertension (idiopathic) with ulcer of bilateral lower extremity: Secondary | ICD-10-CM | POA: Diagnosis not present

## 2019-03-03 DIAGNOSIS — I110 Hypertensive heart disease with heart failure: Secondary | ICD-10-CM | POA: Diagnosis not present

## 2019-03-03 DIAGNOSIS — L97212 Non-pressure chronic ulcer of right calf with fat layer exposed: Secondary | ICD-10-CM | POA: Diagnosis not present

## 2019-03-03 DIAGNOSIS — I509 Heart failure, unspecified: Secondary | ICD-10-CM | POA: Diagnosis not present

## 2019-03-03 DIAGNOSIS — E119 Type 2 diabetes mellitus without complications: Secondary | ICD-10-CM | POA: Diagnosis not present

## 2019-03-03 DIAGNOSIS — L97822 Non-pressure chronic ulcer of other part of left lower leg with fat layer exposed: Secondary | ICD-10-CM | POA: Diagnosis not present

## 2019-03-03 DIAGNOSIS — G4730 Sleep apnea, unspecified: Secondary | ICD-10-CM | POA: Diagnosis not present

## 2019-03-04 ENCOUNTER — Other Ambulatory Visit: Payer: Self-pay | Admitting: Internal Medicine

## 2019-03-07 NOTE — Telephone Encounter (Signed)
Needs visit, can get 1 month refill except imdur which should come from cardiology.

## 2019-03-08 ENCOUNTER — Other Ambulatory Visit: Payer: Self-pay

## 2019-03-08 ENCOUNTER — Encounter: Payer: Self-pay | Admitting: Internal Medicine

## 2019-03-08 ENCOUNTER — Ambulatory Visit (INDEPENDENT_AMBULATORY_CARE_PROVIDER_SITE_OTHER): Payer: BLUE CROSS/BLUE SHIELD | Admitting: Internal Medicine

## 2019-03-08 ENCOUNTER — Other Ambulatory Visit (INDEPENDENT_AMBULATORY_CARE_PROVIDER_SITE_OTHER): Payer: BLUE CROSS/BLUE SHIELD

## 2019-03-08 VITALS — BP 104/70 | HR 56 | Temp 98.3°F | Ht 73.0 in | Wt 284.0 lb

## 2019-03-08 DIAGNOSIS — E785 Hyperlipidemia, unspecified: Secondary | ICD-10-CM

## 2019-03-08 DIAGNOSIS — E1169 Type 2 diabetes mellitus with other specified complication: Secondary | ICD-10-CM

## 2019-03-08 DIAGNOSIS — Z23 Encounter for immunization: Secondary | ICD-10-CM

## 2019-03-08 DIAGNOSIS — Z86010 Personal history of colonic polyps: Secondary | ICD-10-CM

## 2019-03-08 DIAGNOSIS — Z0000 Encounter for general adult medical examination without abnormal findings: Secondary | ICD-10-CM

## 2019-03-08 DIAGNOSIS — L97202 Non-pressure chronic ulcer of unspecified calf with fat layer exposed: Secondary | ICD-10-CM

## 2019-03-08 DIAGNOSIS — I10 Essential (primary) hypertension: Secondary | ICD-10-CM

## 2019-03-08 DIAGNOSIS — E11622 Type 2 diabetes mellitus with other skin ulcer: Secondary | ICD-10-CM

## 2019-03-08 LAB — LIPID PANEL
Cholesterol: 136 mg/dL (ref 0–200)
HDL: 64.1 mg/dL (ref >39.00)
LDL Cholesterol: 61 mg/dL (ref 0–99)
NonHDL: 72.14
Total CHOL/HDL Ratio: 2
Triglycerides: 58 mg/dL (ref 0.0–149.0)
VLDL: 11.6 mg/dL (ref 0.0–40.0)

## 2019-03-08 LAB — CBC
HCT: 41.9 % (ref 39.0–52.0)
Hemoglobin: 14.2 g/dL (ref 13.0–17.0)
MCHC: 33.9 g/dL (ref 30.0–36.0)
MCV: 88.8 fl (ref 78.0–100.0)
Platelets: 255 10*3/uL (ref 150.0–400.0)
RBC: 4.71 Mil/uL (ref 4.22–5.81)
RDW: 16.8 % — ABNORMAL HIGH (ref 11.5–15.5)
WBC: 8.7 10*3/uL (ref 4.0–10.5)

## 2019-03-08 LAB — MICROALBUMIN / CREATININE URINE RATIO
Creatinine,U: 41.4 mg/dL
Microalb Creat Ratio: 20.4 mg/g (ref 0.0–30.0)
Microalb, Ur: 8.4 mg/dL — ABNORMAL HIGH (ref 0.0–1.9)

## 2019-03-08 LAB — COMPREHENSIVE METABOLIC PANEL
ALT: 14 U/L (ref 0–53)
AST: 20 U/L (ref 0–37)
Albumin: 3.9 g/dL (ref 3.5–5.2)
Alkaline Phosphatase: 79 U/L (ref 39–117)
BUN: 25 mg/dL — ABNORMAL HIGH (ref 6–23)
CO2: 28 mEq/L (ref 19–32)
Calcium: 9.5 mg/dL (ref 8.4–10.5)
Chloride: 96 mEq/L (ref 96–112)
Creatinine, Ser: 1.5 mg/dL (ref 0.40–1.50)
GFR: 47.09 mL/min — ABNORMAL LOW (ref >60.00)
Glucose, Bld: 145 mg/dL — ABNORMAL HIGH (ref 70–99)
Potassium: 4.3 mEq/L (ref 3.5–5.1)
Sodium: 131 mEq/L — ABNORMAL LOW (ref 135–145)
Total Bilirubin: 0.7 mg/dL (ref 0.2–1.2)
Total Protein: 7.7 g/dL (ref 6.0–8.3)

## 2019-03-08 LAB — BRAIN NATRIURETIC PEPTIDE: Pro B Natriuretic peptide (BNP): 215 pg/mL — ABNORMAL HIGH (ref 0.0–100.0)

## 2019-03-08 LAB — HEMOGLOBIN A1C: Hgb A1c MFr Bld: 6.9 % — ABNORMAL HIGH (ref 4.6–6.5)

## 2019-03-08 NOTE — Assessment & Plan Note (Signed)
BP at goal on lasix, spironolactone, coreg, amiodarone. Checking CMP and adjust as needed.

## 2019-03-08 NOTE — Assessment & Plan Note (Signed)
Checking lipid panel and adjust crestor 10 mg daily as needed. LDL goal <70.

## 2019-03-08 NOTE — Assessment & Plan Note (Signed)
Reminded of need for colonoscopy and wants done prior to end of year due to met deductible. Given GI number to schedule.

## 2019-03-08 NOTE — Assessment & Plan Note (Signed)
Still seeing wound clinic and did not want to unwrap today as seeing them in 2 days. He states healing some.

## 2019-03-08 NOTE — Assessment & Plan Note (Signed)
Checking HgA1c, foot exam done, reminded about eye exam. Checking microalbumin to creatinine ratio. Taking glipizide currently. Adjust as needed. On crestor and off ACE-I/ARB due to recent AKI.

## 2019-03-08 NOTE — Progress Notes (Signed)
   Subjective:   Patient ID: [name], male    DOB: 04-14-1956, 63 y.o.   MRN: [name]  HPI The patient is a 63 YO man coming in for physical. HgA1c 6.7 01/31/19.  PMH, [facility], social history reviewed and updated  Review of Systems  Constitutional: Negative.   HENT: Negative.   Eyes: Negative.   Respiratory: Negative for cough, chest tightness and shortness of breath.   Cardiovascular: Negative for chest pain, palpitations and leg swelling.  Gastrointestinal: Negative for abdominal distention, abdominal pain, constipation, diarrhea, nausea and vomiting.  Musculoskeletal: Negative.   Skin: Positive for wound.  Neurological: Negative.   Psychiatric/Behavioral: Negative.     Objective:  Physical Exam Constitutional:      Appearance: He is well-developed. He is obese.  HENT:     Head: Normocephalic and atraumatic.  Neck:     Musculoskeletal: Normal range of motion.  Cardiovascular:     Rate and Rhythm: Normal rate.     Comments: Sounds regular on exam Pulmonary:     Effort: Pulmonary effort is normal. No respiratory distress.     Breath sounds: Normal breath sounds. No wheezing or rales.  Abdominal:     General: Bowel sounds are normal. There is no distension.     Palpations: Abdomen is soft.     Tenderness: There is no abdominal tenderness. There is no rebound.  Skin:    General: Skin is warm and dry.     Comments: Two wounds wrapped left shin and right calf region, seeing wound clinic Thursday and did not want to unwrap, foot exam done  Neurological:     Mental Status: He is alert and oriented to person, place, and time.     Coordination: Coordination normal.     Vitals:   03/08/19 0919  BP: 104/70  Pulse: (!) 56  Temp: 98.3 F (36.8 C)  TempSrc: Oral  SpO2: 97%  Weight: 284 lb (128.8 kg)  Height: 6\' 1"  (1.854 m)    Assessment & Plan:  Flu shot given at visit

## 2019-03-08 NOTE — Patient Instructions (Signed)
Call to set up colonoscopy at [phone]   Health Maintenance, Male Adopting a healthy lifestyle and getting preventive care are important in promoting health and wellness. Ask your health care provider about:  The right schedule for you to have regular tests and exams.  Things you can do on your own to prevent diseases and keep yourself healthy. What should I know about diet, weight, and exercise? Eat a healthy diet   Eat a diet that includes plenty of vegetables, fruits, low-fat dairy products, and lean protein.  Do not eat a lot of foods that are high in solid fats, added sugars, or sodium. Maintain a healthy weight Body mass index (BMI) is a measurement that can be used to identify possible weight problems. It estimates body fat based on height and weight. Your health care provider can help determine your BMI and help you achieve or maintain a healthy weight. Get regular exercise Get regular exercise. This is one of the most important things you can do for your health. Most adults should:  Exercise for at least 150 minutes each week. The exercise should increase your heart rate and make you sweat (moderate-intensity exercise).  Do strengthening exercises at least twice a week. This is in addition to the moderate-intensity exercise.  Spend less time sitting. Even light physical activity can be beneficial. Watch cholesterol and blood lipids Have your blood tested for lipids and cholesterol at 64 years of age, then have this test every 5 years. You may need to have your cholesterol levels checked more often if:  Your lipid or cholesterol levels are high.  You are older than 64 years of age.  You are at high risk for heart disease. What should I know about cancer screening? Many types of cancers can be detected early and may often be prevented. Depending on your health history and family history, you may need to have cancer screening at various ages. This may include screening  for:  Colorectal cancer.  Prostate cancer.  Skin cancer.  Lung cancer. What should I know about heart disease, diabetes, and high blood pressure? Blood pressure and heart disease  High blood pressure causes heart disease and increases the risk of stroke. This is more likely to develop in people who have high blood pressure readings, are of African descent, or are overweight.  Talk with your health care provider about your target blood pressure readings.  Have your blood pressure checked: ? Every 3-5 years if you are 18-16 years of age. ? Every year if you are 52 years old or older.  If you are between the ages of 51 and 67 and are a current or former smoker, ask your health care provider if you should have a one-time screening for abdominal aortic aneurysm (AAA). Diabetes Have regular diabetes screenings. This checks your fasting blood sugar level. Have the screening done:  Once every three years after age 87 if you are at a normal weight and have a low risk for diabetes.  More often and at a younger age if you are overweight or have a high risk for diabetes. What should I know about preventing infection? Hepatitis B If you have a higher risk for hepatitis B, you should be screened for this virus. Talk with your health care provider to find out if you are at risk for hepatitis B infection. Hepatitis C Blood testing is recommended for:  Everyone born from [birth year] through 1966.  Anyone with known risk factors for hepatitis C. Sexually transmitted  infections (STIs)  You should be screened each year for STIs, including gonorrhea and chlamydia, if: ? You are sexually active and are younger than 64 years of age. ? You are older than 64 years of age and your health care provider tells you that you are at risk for this type of infection. ? Your sexual activity has changed since you were last screened, and you are at increased risk for chlamydia or gonorrhea. Ask your health care  provider if you are at risk.  Ask your health care provider about whether you are at high risk for HIV. Your health care provider may recommend a prescription medicine to help prevent HIV infection. If you choose to take medicine to prevent HIV, you should first get tested for HIV. You should then be tested every 3 months for as long as you are taking the medicine. Follow these instructions at home: Lifestyle  Do not use any products that contain nicotine or tobacco, such as cigarettes, e-cigarettes, and chewing tobacco. If you need help quitting, ask your health care provider.  Do not use street drugs.  Do not share needles.  Ask your health care provider for help if you need support or information about quitting drugs. Alcohol use  Do not drink alcohol if your health care provider tells you not to drink.  If you drink alcohol: ? Limit how much you have to 0-2 drinks a day. ? Be aware of how much alcohol is in your drink. In the [place] one drink equals one 12 oz bottle of beer (355 mL), one 5 oz glass of wine (148 mL), or one 1 oz glass of hard liquor (44 mL). General instructions  Schedule regular health, dental, and eye exams.  Stay current with your vaccines.  Tell your health care provider if: ? You often feel depressed. ? You have ever been abused or do not feel safe at home. Summary  Adopting a healthy lifestyle and getting preventive care are important in promoting health and wellness.  Follow your health care provider's instructions about healthy diet, exercising, and getting tested or screened for diseases.  Follow your health care provider's instructions on monitoring your cholesterol and blood pressure. This information is not intended to replace advice given to you by your health care provider. Make sure you discuss any questions you have with your health care provider. Document Released: 11/29/2007 Document Revised: 05/26/2018 Document Reviewed: 05/26/2018 [employer]vier  Patient Education  2020 [employer].

## 2019-03-10 DIAGNOSIS — I110 Hypertensive heart disease with heart failure: Secondary | ICD-10-CM | POA: Diagnosis not present

## 2019-03-10 DIAGNOSIS — L97812 Non-pressure chronic ulcer of other part of right lower leg with fat layer exposed: Secondary | ICD-10-CM | POA: Diagnosis not present

## 2019-03-10 DIAGNOSIS — I509 Heart failure, unspecified: Secondary | ICD-10-CM | POA: Diagnosis not present

## 2019-03-10 DIAGNOSIS — L97822 Non-pressure chronic ulcer of other part of left lower leg with fat layer exposed: Secondary | ICD-10-CM | POA: Diagnosis not present

## 2019-03-10 DIAGNOSIS — I87333 Chronic venous hypertension (idiopathic) with ulcer and inflammation of bilateral lower extremity: Secondary | ICD-10-CM | POA: Diagnosis not present

## 2019-03-10 DIAGNOSIS — E119 Type 2 diabetes mellitus without complications: Secondary | ICD-10-CM | POA: Diagnosis not present

## 2019-03-10 DIAGNOSIS — G4730 Sleep apnea, unspecified: Secondary | ICD-10-CM | POA: Diagnosis not present

## 2019-03-10 DIAGNOSIS — L97212 Non-pressure chronic ulcer of right calf with fat layer exposed: Secondary | ICD-10-CM | POA: Diagnosis not present

## 2019-03-11 ENCOUNTER — Encounter: Payer: Self-pay | Admitting: Internal Medicine

## 2019-03-11 ENCOUNTER — Telehealth: Payer: Self-pay

## 2019-03-14 ENCOUNTER — Telehealth: Payer: BLUE CROSS/BLUE SHIELD | Admitting: Internal Medicine

## 2019-03-18 ENCOUNTER — Other Ambulatory Visit: Payer: Self-pay

## 2019-03-18 ENCOUNTER — Encounter (HOSPITAL_BASED_OUTPATIENT_CLINIC_OR_DEPARTMENT_OTHER): Payer: BLUE CROSS/BLUE SHIELD | Attending: Internal Medicine | Admitting: Internal Medicine

## 2019-03-18 DIAGNOSIS — I87321 Chronic venous hypertension (idiopathic) with inflammation of right lower extremity: Secondary | ICD-10-CM | POA: Diagnosis not present

## 2019-03-18 DIAGNOSIS — L97812 Non-pressure chronic ulcer of other part of right lower leg with fat layer exposed: Secondary | ICD-10-CM | POA: Diagnosis not present

## 2019-03-18 DIAGNOSIS — I509 Heart failure, unspecified: Secondary | ICD-10-CM | POA: Insufficient documentation

## 2019-03-18 DIAGNOSIS — L97822 Non-pressure chronic ulcer of other part of left lower leg with fat layer exposed: Secondary | ICD-10-CM | POA: Insufficient documentation

## 2019-03-18 DIAGNOSIS — S81801A Unspecified open wound, right lower leg, initial encounter: Secondary | ICD-10-CM | POA: Diagnosis not present

## 2019-03-18 DIAGNOSIS — I110 Hypertensive heart disease with heart failure: Secondary | ICD-10-CM | POA: Insufficient documentation

## 2019-03-18 DIAGNOSIS — I87313 Chronic venous hypertension (idiopathic) with ulcer of bilateral lower extremity: Secondary | ICD-10-CM | POA: Diagnosis not present

## 2019-03-18 DIAGNOSIS — G4730 Sleep apnea, unspecified: Secondary | ICD-10-CM | POA: Diagnosis not present

## 2019-03-18 DIAGNOSIS — L97212 Non-pressure chronic ulcer of right calf with fat layer exposed: Secondary | ICD-10-CM | POA: Diagnosis not present

## 2019-03-18 DIAGNOSIS — E11622 Type 2 diabetes mellitus with other skin ulcer: Secondary | ICD-10-CM | POA: Diagnosis not present

## 2019-03-18 NOTE — Progress Notes (Signed)
[name], [name] ([name]) Visit Report for 03/18/2019 HPI Details Patient Name: Date of Service: [name], [name] 03/18/2019 12:30 PM Medical Record [name] Patient Account Number: [id number] Date of Birth/Sex: Treating RN: Apr 30, 1956 (63 y.o. [name] Primary Care Provider: [name] Other Clinician: Referring Provider: Treating Provider/Extender:Robson, [name], [name] in Treatment: 33 History of Present Illness HPI Description: ADMISSION 07/29/2018 Mr. Faler is a 63 year old man with either prediabetes or diabetes he is on glipizide. In late October to November 2019 he noted a scabbed area on the back of his right calf. He picked this off a few times but it would not heal. He saw his primary physician on 12/5. It was felt at that time that this may actually heal on its own with conservative management. The next visit was on 07/20/2018 noted the area was a lot worse and arranged for his treatment here. He has been topical antibiotics like Neosporin although he stopped using this when the wound looked worse. He is just been covering this with a clean Band-Aid. He is given Bactrim a week ago and he is finishing this currently. It is made some improvement in the surrounding erythema per the patient. He does not have a history of nonhealing wounds. No prior history of wounds on his legs that he had difficulty healing. No prior skin issues. 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 Past medical history; A. fib status post ablation and recent cardioversion he is on Xarelto. He has a history of systolic heart failure, cervical radiculopathy, cardiomyopathy and type 2 diabetes as discussed ABI in the right leg was noncompressible on the right 2/20; the biopsy I did on the patient last week was negative for malignancy. Culture grew Pseudomonas and methicillin sensitive staph aureus. He is on cefdinir 300 twice a day. I will have to  make this a 10 day course. He put him in compression. He states that the redness pain and erythema are a lot better. I suspect the patient has chronic venous insufficiency probably with a secondary cellulitis 2/27; arrives today with copious amounts of drainage coming out of the wound irritating the skin below the wound area. He only renewed the final 3 days of cefdinir today 3/5; came in for a nurse change 3 days ago. Again a lot of drainage noted. Zinc oxide was applied unfortunately the drainage appears to a pool then he has a string of superficial open areas extending down into the Achilles area and some just below the wound. The wound itself does not look too bad. He is completed the antibiotics although apparently there was separation from the original 7 with the last 3 days. Culture grew a few MSSA and a few Pseudomonas 3/12; not too much difference over the last week. He came in for a dressing change on Monday by our nursing staff. Necrotic debris again over the wound surface. The entire area looks irritated but nontender and I do not think shows obvious evidence of infection. We have not heard anything about the reflux studies. Also notable than in this diabetic man we had noncompressible vessels and although I can feel his pulses easily in his feet I will order arterial studies as well. 3/19-Patient had experience more pain has been dressed with a silver alginate, the wounds all have necrotic debris, very friable with easy bleeding with any kind of surface debridement including with gauze and Anasept and with a #3 curette. His vascular appointments unfortunately were all canceled on  account of the virus outbreak resulting in studies only being done for emergent cases. His pulses are easily palpable in the lower extremity. He has open areas below the primary wound where he states the drainage which included purulent material also with some blood pooled and caused breakdown. 3/26; the  patient was changed to Santyl with [facility] backing last week. This was largely because the silver alginate was sticking to the wound. He is still having quite a bit of pain. He tells [place] that the reflux studies and arterial studies we had attempted to arrange through vein and vascular are not being booked until mid May. Culture that was done last week showed both Serratia moderate and a few methicillin sensitive staph aureus I started him on Bactrim DS 1 p.o. twice daily on 3/23 for 7 days. He is here for follow-up. 4/3; patient is on Santyl with Hydrofera Blue. Patient complains of pain. Our intake nurse is noted drainage and some odor. He has completed Bactrim recently for Serratia and a few methicillin sensitive staph aureus. After our staff push hard last week we were able to get venous studies as well as arterial studies. His arterial studies showed on the right great toe pressure of 0.86. On the left ABI at 1.47 TBI of 0.87. On both sides waveforms were triphasic VENOUS STUDIES showed reflux in the femoral vein, popliteal vein great saphenous vein at the saphenofemoral junction and great saphenous vein at the proximal thigh. Also noted to have age-indeterminate superficial vein thrombosis involving the small saphenous vein. Notable that the patient is already on Xarelto for atrial fibrillation. 4/9; the patient has been to see vascular surgery and reviewed by Dr. [name]. He was felt to have only minimal superficial venous reflux in the right great greater saphenous vein. For this reason he was not felt to be a candidate for laser ablation of the right greater saphenous vein. A wedge cushion was suggested to keep his leg elevated. He does have deep venous reflux on the right. Culture I did last time showed a combination of Serratia Pseudomonas and methicillin sensitive staph aureus. We have been using Hydrofera Blue to the large wound, Santyl to some of the deep punched out satellite  lesions distal to it. There is some improvement [id number]; the application for Apligraf was put out for further review for material that we have resubmitted. He has the deep area on the right posterior calf which is large and then 3 small punched out areas beneath this. In general his wounds look a lot better 4/23; the patient has his large original wound and then 2 punched out satellite lesions below it on the right posterior calf. I was usually able to use Apligraf #1 to cover the full surface area of the larger wound. We continued with Santyl and Hydrofera Blue on the 2 punched-out areas 5/7; patient has his large original wound and 2 punched out satellite lesions below it in the right posterior calf. Apligraf #2 today. Major improvement in the big wound in terms of wound depth. Punched-out Warren wounds have a very healthy looking wound bed. 5/21-Patient comes back for his large right posterior calf wound for which she has been an Apligraf #3 today. Wound depth seems to be better, the punched-out wounds appear to have healthy bed with some bleeding 6/4; right posterior calf wound is much better. Apligraf #4 today. The major wound has no wound depth at all. Only 1 of the satellite lesions is still open. The patient  has very high blood pressure coming in today with a diastolic blood pressure of 130 after lying there for a few minutes it was down to 110. He states that is running [id number] diastolic at home. He was high last week as well I thought he was on 50 mg 1/2 tablet twice daily of losartan I told him to double up on that however it turns out he is actually on 50 twice daily. Course he is run out of his medications prematurely. He has a follow-up with his primary's office next Wednesday. 6/18; patient's wounds continue to improve. The small area laterally is just about closed and there is continued epithelialization on the area on the posterior calf. 7/2; we applied his last Apligraf last  visit. Early the next week there was a lot of purulent looking drainage that I cultured this grew staph and Pseudomonas however when we had him back for the next visit to 3 days later everything looked a lot better. He did not receive systemic antibiotics. Fortunately we did not have to remove the Apligraf. He has had heart trouble this week he was having an ablation apparently they have discovered a clot in his left atrium they have changed all his anticoagulants as well as his blood pressure medication. 7/16; using Hydrofera Blue. We are making nice progress towards closure. He has not yet ordered his compression stockings he has his measurements 7/23; dimensions are better. He has a new satellite area superiorly. Both wounds cauterized with silver nitrate. 7/30; absolutely no change in the major wound on the posterior part of the right calf. We have been using Hydrofera Blue for a prolonged period of time and really had a nice improvement but over the last few weeks this is stalled. There is no depth. He arrived in clinic today with a new area on the right anterior tibia which apparently was an "ingrown hair". It is clearly an open area. This looks like a wrap injury although he was not really aware of it. 8/31; since the patient was last in clinic he was admitted to hospital from 8/17 through 8/22. He had had multiple falls at home including one off a ladder. He was admitted to hospital with mild COVID-19 viral pneumonia. Noted to be hyperkalemic and in acute renal failure. He has chronic systolic heart failure with ejection fraction of 35%. He arrives back in clinic with the original wound on the posterior aspect of the right lower calf looking a lot better. He has eschared areas from falls and lacerations on his anterior knees bilaterally just below the patella and on the mid part of his tibia bilaterally. He seems to have a small area on the right lateral ankle as well 9/10- Patient comes  to clinic with a 2 new wounds one on the left anterior shin and one on the right posterior leg both the left knee and right knee areas are healed up. We have been using Santyl to the left anterior leg and silver alginate to right posterior leg wounds. 9/17; the patient is original wound looks good on the posterior right calf. He has traumatic areas on the left anterior shin left anterior patellar tendon and the right mid tibia area. Except for the right posterior calf all required debridement. We have been using Santyl on these areas and silver alginate posteriorly right calf 9/24; the original wound on the posterior calf on the right looks good. This is healthy. There are traumatic wounds in the left anterior shin,  left anterior patella in the right mid tibia area are about the same. He has dusky erythema on the left anterior tibia which led me to give him antibiotics last week. This does not look too much different. This is nontender and I wonder if this is all just venous inflammation. 10/2; the patient has 2 open areas on the right posterior calf both of these look good we have been using silver alginate. He has traumatic wounds on the left and right mid tibia areas. The surface of these wounds is still not ready for closure. We have been using Iodoflex. Finally has areas on the left knee. The latter wounds are all traumatic after a fall Electronic Signature(s) Signed: 03/18/2019 6:19:43 PM By: Linton Ham MD Entered By: Linton Ham on 03/18/2019 13:29:35 -------------------------------------------------------------------------------- Physical Exam Details Patient Name: Date of Service: [name] 03/18/2019 12:30 PM Medical Record [id number] Patient Account Number: [id number] Date of Birth/Sex: Treating RN: 1956-02-09 (63 y.o. [name] Primary Care Provider: [name] Other Clinician: Referring Provider: Treating Provider/Extender:Robson,  [name], [name] in Treatment: 33 Constitutional Patient is hypertensive.. Pulse regular and within target range for patient.[name] Respirations regular, non-labored and within target range.. Temperature is normal and within the target range for the patient.[name] Appears in no distress. Eyes Conjunctivae clear. No discharge.no icterus. Respiratory work of breathing is normal. Cardiovascular Pedal pulses palpable and strong bilaterally.. Integumentary (Hair, Skin) There is less erythema around the wound changes of chronic venous insufficiency. Psychiatric appears at normal baseline. Notes Wound exam Left anterior shin still has a lot of debris on the surface similarly the right anterior shin. I am using Anasept and gauze. Very difficult to debride because of the blood thinner he is on. The area on the right posterior calf which was his original wound secondary to venous insufficiency looks quite good. The area underneath this is smaller and in a similar state The area on the left anterior patella area looks satisfactory Electronic Signature(s) Signed: 03/18/2019 6:19:43 PM By: Linton Ham MD Entered By: Linton Ham on 03/18/2019 13:31:20 -------------------------------------------------------------------------------- Physician Orders Details Patient Name: Date of Service: [name] 03/18/2019 12:30 PM Medical Record [id number] Patient Account Number: [id number] Date of Birth/Sex: Treating RN: March 25, 1956 (64 y.o. [name] Primary Care Provider: [name] Other Clinician: Referring Provider: Treating Provider/Extender:Robson, [name], [name] in Treatment: 33 Verbal / Phone Orders: No Diagnosis Coding ICD-10 Coding Code Description [phone] Non-pressure chronic ulcer of right calf with fat layer exposed E11.622 Type 2 diabetes mellitus with other skin ulcer I87.321 Chronic venous hypertension (idiopathic) with  inflammation of right lower extremity S81.811D Laceration without foreign body, right lower leg, subsequent encounter S81.812D Laceration without foreign body, left lower leg, subsequent encounter L03.115 Cellulitis of right lower limb Follow-up Appointments Return Appointment in 1 week. Dressing Change Frequency Do not change entire dressing for one week. - both lower legs. Wound #11 Left Knee Change Dressing every other day. Skin Barriers/Peri-Wound Care Moisturizing lotion - both legs Wound Cleansing May shower with protection. Primary Wound Dressing Wound #1 Right,Posterior Calf Calcium Alginate with Silver Wound #11 Left Knee Calcium Alginate with Silver Wound #6 Right,Proximal,Posterior Lower Leg Calcium Alginate with Silver Wound #7 Right,Anterior Lower Leg Iodoflex Wound #8 Left,Anterior Lower Leg Iodoflex Secondary Dressing Wound #1 Right,Posterior Calf ABD pad Kerramax - or zetvuit Wound #11 Left Knee Foam Border Wound #6 Right,Proximal,Posterior Lower Leg ABD pad Kerramax - or zetvuit Wound #7 Right,Anterior Lower Leg ABD pad Kerramax - or zetvuit  Wound #8 Left,Anterior Lower Leg ABD pad Kerramax - or zetvuit Edema Control 4 layer compression - Bilateral Avoid standing for long periods of time Elevate legs to the level of the heart or above for 30 minutes daily and/or when sitting, a frequency of: - 3-4 times a day. Electronic Signature(s) Signed: 03/18/2019 6:19:43 PM By: Linton Ham MD Signed: 03/18/2019 7:05:19 PM By: Baruch Gouty RN, BSN Entered By: Baruch Gouty on 03/18/2019 13:21:53 -------------------------------------------------------------------------------- Problem List Details Patient Name: Date of Service: [name], [name] 03/18/2019 12:30 PM Medical Record [id number] Patient Account Number: [id number] Date of Birth/Sex: Treating RN: April 28, 1956 (63 y.o. [name] Primary Care Provider: [name] Other  Clinician: Referring Provider: Treating Provider/Extender:Robson, [name], [name] in Treatment: 33 Active Problems ICD-10 Evaluated Encounter Code Description Active Date Today Diagnosis L97.212 Non-pressure chronic ulcer of right calf with fat layer 07/29/2018 No Yes exposed E11.622 Type 2 diabetes mellitus with other skin ulcer 07/29/2018 No Yes I87.321 Chronic venous hypertension (idiopathic) with 07/29/2018 No Yes inflammation of right lower extremity S81.811D Laceration without foreign body, right lower leg, 02/14/2019 No Yes subsequent encounter S81.812D Laceration without foreign body, left lower leg, 02/14/2019 No Yes subsequent encounter L03.115 Cellulitis of right lower limb 09/24/2018 No Yes Inactive Problems Resolved Problems Electronic Signature(s) Signed: 03/18/2019 6:19:43 PM By: Linton Ham MD Entered By: Linton Ham on 03/18/2019 13:28:10 -------------------------------------------------------------------------------- Progress Note Details Patient Name: Date of Service: [name] 03/18/2019 12:30 PM Medical Record [id number] Patient Account Number: [id number] Date of Birth/Sex: Treating RN: [birth year] (63 y.o. [name] Primary Care Provider: [name] Other Clinician: Referring Provider: Treating Provider/Extender:Robson, [name], [name] in Treatment: 33 Subjective History of Present Illness (HPI) ADMISSION 07/29/2018 Mr. Alvardo is a 63 year old man with either prediabetes or diabetes he is on glipizide. In late October to November 2019 he noted a scabbed area on the back of his right calf. He picked this off a few times but it would not heal. He saw his primary physician on 12/5. It was felt at that time that this may actually heal on its own with conservative management. The next visit was on 07/20/2018 noted the area was a lot worse and arranged for his treatment here. He has been  topical antibiotics like Neosporin although he stopped using this when the wound looked worse. He is just been covering this with a clean Band-Aid. He is given Bactrim a week ago and he is finishing this currently. It is made some improvement in the surrounding erythema per the patient. He does not have a history of nonhealing wounds. No prior history of wounds on his legs that he had difficulty healing. No prior skin issues. 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 Past medical history; A. fib status post ablation and recent cardioversion he is on Xarelto. He has a history of systolic heart failure, cervical radiculopathy, cardiomyopathy and type 2 diabetes as discussed ABI in the right leg was noncompressible on the right 2/20; the biopsy I did on the patient last week was negative for malignancy. Culture grew Pseudomonas and methicillin sensitive staph aureus. He is on cefdinir 300 twice a day. I will have to make this a 10 day course. He put him in compression. He states that the redness pain and erythema are a lot better. I suspect the patient has chronic venous insufficiency probably with a secondary cellulitis 2/27; arrives today with copious amounts of drainage coming out of the wound irritating the skin  below the wound area. He only renewed the final 3 days of cefdinir today 3/5; came in for a nurse change 3 days ago. Again a lot of drainage noted. Zinc oxide was applied unfortunately the drainage appears to a pool then he has a string of superficial open areas extending down into the Achilles area and some just below the wound. The wound itself does not look too bad. He is completed the antibiotics although apparently there was separation from the original 7 with the last 3 days. Culture grew a few MSSA and a few Pseudomonas 3/12; not too much difference over the last week. He came in for a dressing change on Monday by our nursing staff. Necrotic debris again over the  wound surface. The entire area looks irritated but nontender and I do not think shows obvious evidence of infection. We have not heard anything about the reflux studies. Also notable than in this diabetic man we had noncompressible vessels and although I can feel his pulses easily in his feet I will order arterial studies as well. 3/19-Patient had experience more pain has been dressed with a silver alginate, the wounds all have necrotic debris, very friable with easy bleeding with any kind of surface debridement including with gauze and Anasept and with a #3 curette. His vascular appointments unfortunately were all canceled on account of the virus outbreak resulting in studies only being done for emergent cases. His pulses are easily palpable in the lower extremity. He has open areas below the primary wound where he states the drainage which included purulent material also with some blood pooled and caused breakdown. 3/26; the patient was changed to Santyl with [facility] backing last week. This was largely because the silver alginate was sticking to the wound. He is still having quite a bit of pain. He tells [place] that the reflux studies and arterial studies we had attempted to arrange through vein and vascular are not being booked until mid May. Culture that was done last week showed both Serratia moderate and a few methicillin sensitive staph aureus I started him on Bactrim DS 1 p.o. twice daily on 3/23 for 7 days. He is here for follow-up. 4/3; patient is on Santyl with Hydrofera Blue. Patient complains of pain. Our intake nurse is noted drainage and some odor. He has completed Bactrim recently for Serratia and a few methicillin sensitive staph aureus. After our staff push hard last week we were able to get venous studies as well as arterial studies. His arterial studies showed on the right great toe pressure of 0.86. On the left ABI at 1.47 TBI of 0.87. On both sides waveforms were  triphasic VENOUS STUDIES showed reflux in the femoral vein, popliteal vein great saphenous vein at the saphenofemoral junction and great saphenous vein at the proximal thigh. Also noted to have age-indeterminate superficial vein thrombosis involving the small saphenous vein. Notable that the patient is already on Xarelto for atrial fibrillation. 4/9; the patient has been to see vascular surgery and reviewed by Dr. [name]. He was felt to have only minimal superficial venous reflux in the right great greater saphenous vein. For this reason he was not felt to be a candidate for laser ablation of the right greater saphenous vein. A wedge cushion was suggested to keep his leg elevated. He does have deep venous reflux on the right. Culture I did last time showed a combination of Serratia Pseudomonas and methicillin sensitive staph aureus. We have been using Hydrofera Blue to the  large wound, Santyl to some of the deep punched out satellite lesions distal to it. There is some improvement [id number]; the application for Apligraf was put out for further review for material that we have resubmitted. He has the deep area on the right posterior calf which is large and then 3 small punched out areas beneath this. In general his wounds look a lot better 4/23; the patient has his large original wound and then 2 punched out satellite lesions below it on the right posterior calf. I was usually able to use Apligraf #1 to cover the full surface area of the larger wound. We continued with Santyl and Hydrofera Blue on the 2 punched-out areas 5/7; patient has his large original wound and 2 punched out satellite lesions below it in the right posterior calf. Apligraf #2 today. Major improvement in the big wound in terms of wound depth. Punched-out Warren wounds have a very healthy looking wound bed. 5/21-Patient comes back for his large right posterior calf wound for which she has been an Apligraf #3 today. Wound depth seems  to be better, the punched-out wounds appear to have healthy bed with some bleeding 6/4; right posterior calf wound is much better. Apligraf #4 today. The major wound has no wound depth at all. Only 1 of the satellite lesions is still open. The patient has very high blood pressure coming in today with a diastolic blood pressure of 130 after lying there for a few minutes it was down to 110. He states that is running [id number] diastolic at home. He was high last week as well I thought he was on 50 mg 1/2 tablet twice daily of losartan I told him to double up on that however it turns out he is actually on 50 twice daily. Course he is run out of his medications prematurely. He has a follow-up with his primary's office next Wednesday. 6/18; patient's wounds continue to improve. The small area laterally is just about closed and there is continued epithelialization on the area on the posterior calf. 7/2; we applied his last Apligraf last visit. Early the next week there was a lot of purulent looking drainage that I cultured this grew staph and Pseudomonas however when we had him back for the next visit to 3 days later everything looked a lot better. He did not receive systemic antibiotics. Fortunately we did not have to remove the Apligraf. He has had heart trouble this week he was having an ablation apparently they have discovered a clot in his left atrium they have changed all his anticoagulants as well as his blood pressure medication. 7/16; using Hydrofera Blue. We are making nice progress towards closure. He has not yet ordered his compression stockings he has his measurements 7/23; dimensions are better. He has a new satellite area superiorly. Both wounds cauterized with silver nitrate. 7/30; absolutely no change in the major wound on the posterior part of the right calf. We have been using Hydrofera Blue for a prolonged period of time and really had a nice improvement but over the last few weeks  this is stalled. There is no depth. He arrived in clinic today with a new area on the right anterior tibia which apparently was an "ingrown hair". It is clearly an open area. This looks like a wrap injury although he was not really aware of it. 8/31; since the patient was last in clinic he was admitted to hospital from 8/17 through 8/22. He had had multiple falls at home  including one off a ladder. He was admitted to hospital with mild COVID-19 viral pneumonia. Noted to be hyperkalemic and in acute renal failure. He has chronic systolic heart failure with ejection fraction of 35%. He arrives back in clinic with the original wound on the posterior aspect of the right lower calf looking a lot better. He has eschared areas from falls and lacerations on his anterior knees bilaterally just below the patella and on the mid part of his tibia bilaterally. He seems to have a small area on the right lateral ankle as well 9/10- Patient comes to clinic with a 2 new wounds one on the left anterior shin and one on the right posterior leg both the left knee and right knee areas are healed up. We have been using Santyl to the left anterior leg and silver alginate to right posterior leg wounds. 9/17; the patient is original wound looks good on the posterior right calf. He has traumatic areas on the left anterior shin left anterior patellar tendon and the right mid tibia area. Except for the right posterior calf all required debridement. We have been using Santyl on these areas and silver alginate posteriorly right calf 9/24; the original wound on the posterior calf on the right looks good. This is healthy. There are traumatic wounds in the left anterior shin, left anterior patella in the right mid tibia area are about the same. He has dusky erythema on the left anterior tibia which led me to give him antibiotics last week. This does not look too much different. This is nontender and I wonder if this is all just  venous inflammation. 10/2; the patient has 2 open areas on the right posterior calf both of these look good we have been using silver alginate. He has traumatic wounds on the left and right mid tibia areas. The surface of these wounds is still not ready for closure. We have been using Iodoflex. Finally has areas on the left knee. The latter wounds are all traumatic after a fall Objective Constitutional Patient is hypertensive.. Pulse regular and within target range for patient.[name] Respirations regular, non-labored and within target range.. Temperature is normal and within the target range for the patient.[name] Appears in no distress. Vitals Time Taken: 12:38 PM, Height: 73 in, Weight: 290 lbs, BMI: 38.3, Temperature: 98.6 F, Pulse: 70 bpm, Respiratory Rate: 16 breaths/min, Blood Pressure: 145/89 mmHg. Eyes Conjunctivae clear. No discharge.no icterus. Respiratory work of breathing is normal. Cardiovascular Pedal pulses palpable and strong bilaterally.[name] Psychiatric appears at normal baseline. General Notes: Wound exam ooLeft anterior shin still has a lot of debris on the surface similarly the right anterior shin. I am using Anasept and gauze. Very difficult to debride because of the blood thinner he is on. ooThe area on the right posterior calf which was his original wound secondary to venous insufficiency looks quite good. The area underneath this is smaller and in a similar state ooThe area on the left anterior patella area looks satisfactory Integumentary (Hair, Skin) There is less erythema around the wound changes of chronic venous insufficiency. Wound #1 status is Open. Original cause of wound was Gradually Appeared. The wound is located on the Right,Posterior Calf. The wound measures 0.1cm length x 0.1cm width x 0.1cm depth; 0.008cm^2 area and 0.001cm^3 volume. There is Fat Layer (Subcutaneous Tissue) Exposed exposed. There is no tunneling or undermining noted. There is a none present  amount of drainage noted. The wound margin is flat and intact. There is no granulation  within the wound bed. There is no necrotic tissue within the wound bed. Wound #11 status is Open. Original cause of wound was Blister. The wound is located on the Left Knee. The wound measures 0.4cm length x 0.5cm width x 0.1cm depth; 0.157cm^2 area and 0.016cm^3 volume. There is Fat Layer (Subcutaneous Tissue) Exposed exposed. There is no tunneling or undermining noted. There is a small amount of serosanguineous drainage noted. The wound margin is distinct with the outline attached to the wound base. There is large (67-100%) red granulation within the wound bed. There is no necrotic tissue within the wound bed. Wound #6 status is Open. Original cause of wound was Gradually Appeared. The wound is located on the Right,Proximal,Posterior Lower Leg. The wound measures 2cm length x 2.1cm width x 0.1cm depth; 3.299cm^2 area and 0.33cm^3 volume. There is Fat Layer (Subcutaneous Tissue) Exposed exposed. There is no tunneling or undermining noted. There is a medium amount of serosanguineous drainage noted. The wound margin is flat and intact. There is large (67-100%) red, pink, friable granulation within the wound bed. There is no necrotic tissue within the wound bed. Wound #7 status is Open. Original cause of wound was Trauma. The wound is located on the Right,Anterior Lower Leg. The wound measures 1.5cm length x 1.4cm width x 0.2cm depth; 1.649cm^2 area and 0.33cm^3 volume. There is Fat Layer (Subcutaneous Tissue) Exposed exposed. There is no tunneling or undermining noted. There is a medium amount of serosanguineous drainage noted. The wound margin is epibole. There is large (67-100%) pink granulation within the wound bed. There is a small (1-33%) amount of necrotic tissue within the wound bed including Adherent Slough. Wound #8 status is Open. Original cause of wound was Gradually Appeared. The wound is located on  the Left,Anterior Lower Leg. The wound measures 9cm length x 2.9cm width x 0.1cm depth; 20.499cm^2 area and 2.05cm^3 volume. There is Fat Layer (Subcutaneous Tissue) Exposed exposed. There is no tunneling or undermining noted. There is a medium amount of serosanguineous drainage noted. The wound margin is distinct with the outline attached to the wound base. There is large (67-100%) red granulation within the wound bed. There is a small (1-33%) amount of necrotic tissue within the wound bed including Adherent Slough. Assessment Active Problems ICD-10 Non-pressure chronic ulcer of right calf with fat layer exposed Type 2 diabetes mellitus with other skin ulcer Chronic venous hypertension (idiopathic) with inflammation of right lower extremity Laceration without foreign body, right lower leg, subsequent encounter Laceration without foreign body, left lower leg, subsequent encounter Cellulitis of right lower limb Procedures Wound #6 Pre-procedure diagnosis of Wound #6 is a Venous Leg Ulcer located on the Right,Proximal,Posterior Lower Leg . There was a Four Layer Compression Therapy Procedure by [name] Pilling, RN. Post procedure Diagnosis Wound #6: Same as Pre-Procedure Wound #8 Pre-procedure diagnosis of Wound #8 is a Venous Leg Ulcer located on the Left,Anterior Lower Leg . There was a Four Layer Compression Therapy Procedure by [name] Pilling, RN. Post procedure Diagnosis Wound #8: Same as Pre-Procedure Plan Follow-up Appointments: Return Appointment in 1 week. Dressing Change Frequency: Do not change entire dressing for one week. - both lower legs. Wound #11 Left Knee: Change Dressing every other day. Skin Barriers/Peri-Wound Care: Moisturizing lotion - both legs Wound Cleansing: May shower with protection. Primary Wound Dressing: Wound #1 Right,Posterior Calf: Calcium Alginate with Silver Wound #11 Left Knee: Calcium Alginate with Silver Wound #6 Right,Proximal,Posterior  Lower Leg: Calcium Alginate with Silver Wound #7 Right,Anterior Lower Leg: Iodoflex  Wound #8 Left,Anterior Lower Leg: Iodoflex Secondary Dressing: Wound #1 Right,Posterior Calf: ABD pad Kerramax - or zetvuit Wound #11 Left Knee: Foam Border Wound #6 Right,Proximal,Posterior Lower Leg: ABD pad Kerramax - or zetvuit Wound #7 Right,Anterior Lower Leg: ABD pad Kerramax - or zetvuit Wound #8 Left,Anterior Lower Leg: ABD pad Kerramax - or zetvuit Edema Control: 4 layer compression - Bilateral Avoid standing for long periods of time Elevate legs to the level of the heart or above for 30 minutes daily and/or when sitting, a frequency of: - 3-4 times a day. 1. I am continuing with Iodoflex to the left anterior and right anterior tibial wounds he. He is complaining of a lot of pain in these areas I do not think there is any cellulitis. I would like to be able to change off the Iodoflex hopefully by next week 2. Electronic Signature(s) Signed: 03/18/2019 6:19:43 PM By: Linton Ham MD Entered By: Linton Ham on 03/18/2019 13:32:20 -------------------------------------------------------------------------------- SuperBill Details Patient Name: Date of Service: KASTON, SPATA 03/18/2019 Medical Record [name] Patient Account Number: [id number] Date of Birth/Sex: Treating RN: Jul 27, 1955 (63 y.o. [name] Primary Care Provider: [name] Other Clinician: Referring Provider: Treating Provider/Extender:Robson, [name], [name] in Treatment: 33 Diagnosis Coding ICD-10 Codes Code Description [phone] Non-pressure chronic ulcer of right calf with fat layer exposed E11.622 Type 2 diabetes mellitus with other skin ulcer I87.321 Chronic venous hypertension (idiopathic) with inflammation of right lower extremity S81.811D Laceration without foreign body, right lower leg, subsequent encounter S81.812D Laceration without foreign body, left  lower leg, subsequent encounter L03.115 Cellulitis of right lower limb Facility Procedures Physician Procedures CPT4 Code Description: [id number] - WC PHYS LEVEL 3 - EST PT ICD-10 Diagnosis Description L97.212 Non-pressure chronic ulcer of right calf with fat la S81.811D Laceration without foreign body, right lower leg, su S81.812D Laceration without foreign  body, left lower leg, sub Modifier: yer exposed bsequent encount sequent encounte Quantity: 1 er Catering manager) Signed: 03/18/2019 6:19:43 PM By: Linton Ham MD Entered By: Linton Ham on 03/18/2019 13:35:10

## 2019-03-21 NOTE — Progress Notes (Signed)
[name], [name] ([id number]) Visit Report for 03/18/2019 Arrival Information Details Patient Name: Date of Service: [name], [name] 03/18/2019 12:30 PM Medical Record [id number] Patient Account Number: [id number] Date of Birth/Sex: Treating RN: 1955/06/24 (63 y.o. [name], [name] Primary Care [name]: [name] Other Clinician: Referring Aerika Groll: Treating [name]/Extender:[name], [name], [name] in Treatment: 33 Visit Information History Since Last Visit Added or deleted any medications: No Patient Arrived: Ambulatory Any new allergies or adverse reactions: No Arrival Time: 12:40 Had a fall or experienced change in No Accompanied By: self activities of daily living that may affect Transfer Assistance: None risk of falls: Patient Identification Verified: Yes Signs or symptoms of abuse/neglect since last No Secondary Verification Process Yes visito Completed: Hospitalized since last visit: No Patient Requires Transmission-Based No Implantable device outside of the clinic excluding No Precautions: cellular tissue based products placed in the center Patient Has Alerts: Yes since last visit: Patient Alerts: Patient on Blood Has Dressing in Place as Prescribed: Yes Thinner Has Compression in Place as Prescribed: Yes xarelto Pain Present Now: Yes R TBI = .86, L TBI = .87 Electronic Signature(s) Signed: 03/18/2019 6:13:58 PM By: [name] Entered By: [name] on 03/18/2019 12:40:46 -------------------------------------------------------------------------------- Compression Therapy Details Patient Name: Date of Service: [name], [name] 03/18/2019 12:30 PM Medical Record [id number] Patient Account Number: [id number] Date of Birth/Sex: Treating RN: June 21, 1955 (63 y.o. [name] Primary Care [name]: [name] Other Clinician: Referring Chelsy Parrales: Treating Laretha Luepke/Extender:[name], [name],  [name] in Treatment: 33 Compression Therapy Performed for Wound Wound #6 Right,Proximal,Posterior Lower Leg Assessment: Performed By: Clinician [name] Pilling, RN Compression Type: Four Layer Post Procedure Diagnosis Same as Pre-procedure Electronic Signature(s) Signed: 03/18/2019 7:05:19 PM By: Baruch Gouty RN, BSN Entered By: Baruch Gouty on 03/18/2019 13:22:57 -------------------------------------------------------------------------------- Compression Therapy Details Patient Name: Date of Service: [name] 03/18/2019 12:30 PM Medical Record [id number] Patient Account Number: [id number] Date of Birth/Sex: Treating RN: 1955-07-28 (63 y.o. [name] Primary Care [name]: [name] Other Clinician: Referring Ishaq Maffei: Treating Shayne Deerman/Extender:[name], [name], [name] in Treatment: 33 Compression Therapy Performed for Wound Wound #8 Left,Anterior Lower Leg Assessment: Performed By: Clinician [name] Pilling, RN Compression Type: Four Layer Post Procedure Diagnosis Same as Pre-procedure Electronic Signature(s) Signed: 03/18/2019 7:05:19 PM By: Baruch Gouty RN, BSN Entered By: Baruch Gouty on 03/18/2019 13:22:57 -------------------------------------------------------------------------------- Encounter Discharge Information Details Patient Name: Date of Service: [name]. 03/18/2019 12:30 PM Medical Record [id number] Patient Account Number: [id number] Date of Birth/Sex: Treating RN: 1956-03-09 (63 y.o. [name] Primary Care [name]: [name] Other Clinician: Referring Daneille Desilva: Treating [name]/Extender:[name], [name], [name] in Treatment: 33 Encounter Discharge Information Items Discharge Condition: Stable Ambulatory Status: Ambulatory Discharge Destination: Home Transportation: Private Auto Accompanied By: alone Schedule Follow-up Appointment:  Yes Clinical Summary of Care: Patient Declined Electronic Signature(s) Signed: 03/21/2019 6:17:12 PM By: [name] Hurst RN, BSN Entered By: [name] on 03/18/2019 18:43:22 -------------------------------------------------------------------------------- Lower Extremity Assessment Details Patient Name: Date of Service: [name] 03/18/2019 12:30 PM Medical Record [id number] Patient Account Number: [id number] Date of Birth/Sex: Treating RN: 07-Oct-1955 (63 y.o. [name] Primary Care [name]: [name] Other Clinician: Referring [name]: Treating [name]/Extender:[name], [name], [name] in Treatment: 33 Edema Assessment Assessed: [Left: Yes] [Right: Yes] Edema: [Left: Yes] [Right: No] Calf Left: Right: Point of Measurement: cm From Medial Instep 38 cm 34 cm Ankle Left: Right: Point of Measurement: cm From Medial Instep 24 cm 24 cm Vascular Assessment Pulses: Dorsalis Pedis Palpable: [Left:Yes] [Right:Yes]  Electronic Signature(s) Signed: 03/18/2019 6:13:58 PM By: [name] Entered By: [name] on 03/18/2019 12:47:12 -------------------------------------------------------------------------------- Multi Wound Chart Details Patient Name: Date of Service: [name] 03/18/2019 12:30 PM Medical Record [id number] Patient Account Number: [id number] Date of Birth/Sex: Treating RN: 08/24/1955 (63 y.o. [name] Primary Care [name]: [name] Other Clinician: Referring Lakeishia Truluck: Treating Natalyn Szymanowski/Extender:[name], [name], [name] in Treatment: 33 Vital Signs Height(in): 84 Pulse(bpm): 33 Weight(lbs): 290 Blood Pressure(mmHg): 145/89 Body Mass Index(BMI): 38 Temperature(F): 98.6 Respiratory 16 Rate(breaths/min): Photos: [1:No Photos] [11:No Photos] [6:No Photos] Wound Location: [1:Right Calf - Posterior] [11:Left Knee] [6:Right Lower Leg - Posterior,  Proximal] Wounding Event: [1:Gradually Appeared] [11:Blister] [6:Gradually Appeared] Primary Etiology: [1:Venous Leg Ulcer] [11:Diabetic Wound/Ulcer of the Venous Leg Ulcer Lower Extremity] Secondary Etiology: [1:Cellulitis] [11:N/A] [6:N/A] Comorbid History: [1:Asthma, Sleep Apnea, Arrhythmia, Congestive Heart Failure, Hypertension, Heart Failure, Hypertension, Heart Failure, Hypertension, Type II Diabetes] [11:Asthma, Sleep Apnea, Arrhythmia, Congestive Type II Diabetes] [6:Asthma, Sleep  Apnea, Arrhythmia, Congestive Type II Diabetes] Date Acquired: [1:04/16/2018] [11:02/25/2019] [6:01/06/2019] Weeks of Treatment: [1:33] [11:2] [6:10] Wound Status: [1:Open] [11:Open] [6:Open] Measurements L x W x D 0.1x0.1x0.1 [11:0.4x0.5x0.1] [6:2x2.1x0.1] (cm) Area (cm) : [1:0.008] [11:0.157] [6:3.299] Volume (cm) : [1:0.001] [11:0.016] [6:0.33] % Reduction in Area: [1:99.90%] [11:96.00%] [6:-739.40%] % Reduction in Volume: 100.00% [11:95.90%] [6:-746.20%] Classification: [1:Full Thickness With Exposed Support Structures] [11:Grade 1] [6:Full Thickness Without Exposed Support Structures] Exudate Amount: [1:None Present] [11:Small] [6:Medium] Exudate Type: [1:N/A] [11:Serosanguineous] [6:Serosanguineous] Exudate Color: [1:N/A] [11:red, brown] [6:red, brown] Wound Margin: [1:Flat and Intact] [11:Distinct, outline attached Flat and Intact] Granulation Amount: [1:None Present (0%)] [11:Large (67-100%)] [6:Large (67-100%)] Granulation Quality: [1:N/A] [11:Red] [6:Red, Pink, Friable] Necrotic Amount: [1:None Present (0%)] [11:None Present (0%)] [6:None Present (0%)] Exposed Structures: [1:Fat Layer (Subcutaneous Fat Layer (Subcutaneous Fat Layer (Subcutaneous Tissue) Exposed: Yes Fascia: No Tendon: No Muscle: No Joint: No Bone: No] [11:Tissue) Exposed: Yes Fascia: No Tendon: No Muscle: No Joint: No Bone: No] [6:Tissue) Exposed: Yes  Fascia: No Tendon: No Muscle: No Joint: No Bone: No] Epithelialization:  [1:Large (67-100%)] [11:Large (67-100%)] [6:Large (67-100%)] Procedures Performed: N/A [11:N/A 7] [6:Compression Therapy 8 N/A] Photos: [1:No Photos] [11:No Photos] [6:N/A] Wound Location: [1:Right Lower Leg - Anterior Left Lower Leg - Anterior] [6:N/A] Wounding Event: [1:Trauma] [11:Gradually Appeared] [6:N/A] Primary Etiology: [1:Venous Leg Ulcer] [11:Venous Leg Ulcer] [6:N/A] Secondary Etiology: [1:N/A] [11:N/A] [6:N/A] Comorbid History: [1:Asthma, Sleep Apnea, Arrhythmia, Congestive Heart Failure, Hypertension, Heart Failure, Hypertension, Type II Diabetes] [11:Asthma, Sleep Apnea, Arrhythmia, Congestive Type II Diabetes] [6:N/A] Date Acquired: [1:01/13/2019] [11:02/14/2019] [6:N/A] Weeks of Treatment: [1:9] [11:4] [6:N/A] Wound Status: [1:Open] [11:Open] [6:N/A] Measurements L x W x D [1:1.5x1.4x0.2] [11:9x2.9x0.1] [6:N/A] (cm) Area (cm) : [1:1.649] [11:20.499] [6:N/A] Volume (cm) : [1:0.33] [11:2.05] [6:N/A] % Reduction in Area: [1:-741.30%] [11:-226.30%] [6:N/A] % Reduction in Volume: [1:-1550.00%] [11:-226.40%] [6:N/A] Classification: [1:Full Thickness Without Exposed Support Structures Exposed Support Structures] [11:Full Thickness Without] [6:N/A] Exudate Amount: [1:Medium] [11:Medium] [6:N/A] Exudate Type: [1:Serosanguineous] [11:Serosanguineous] [6:N/A] Exudate Color: [1:red, brown] [11:red, brown] [6:N/A] Wound Margin: [1:Epibole] [11:Distinct, outline attached N/A] Granulation Amount: [1:Large (67-100%)] [11:Large (67-100%)] [6:N/A] Granulation Quality: [1:Pink] [11:Red] [6:N/A] Necrotic Amount: [1:Small (1-33%)] [11:Small (1-33%)] [6:N/A] Exposed Structures: [1:Fat Layer (Subcutaneous Fat Layer (Subcutaneous N/A Tissue) Exposed: Yes Fascia: No Tendon: No Muscle: No Joint: No Bone: No] [11:Tissue) Exposed: Yes Fascia: No Tendon: No Muscle: No Joint: No Bone: No] Epithelialization: [1:Small (1-33%) N/A] [11:Small (1-33%) Compression Therapy] [6:N/A N/A] Treatment  Notes Electronic Signature(s) Signed: 03/18/2019 6:19:43 PM By: Linton Ham MD Signed: 03/18/2019 7:05:19 PM By: Johna Roles  Vaughan Basta RN, BSN Entered By: Linton Ham on 03/18/2019 13:28:17 -------------------------------------------------------------------------------- Multi-Disciplinary Care Plan Details Patient Name: Date of Service: [name], [name] 03/18/2019 12:30 PM Medical Record [id number] Patient Account Number: [id number] Date of Birth/Sex: Treating RN: [birth year] (63 y.o. [name] Primary Care Ahmira Boisselle: [name] Other Clinician: Referring Erleen Egner: Treating Barclay Lennox/Extender:[name], [name], [name] in Treatment: 33 Active Inactive Venous Leg Ulcer Nursing Diagnoses: Actual venous Insuffiency (use after diagnosis is confirmed) Knowledge deficit related to disease process and management Goals: Non-invasive venous studies are completed as ordered Date Initiated: 09/17/2018 Date Inactivated: 09/23/2018 Target Resolution Date: 09/23/2018 Goal Status: Met Patient will maintain optimal edema control Date Initiated: 09/17/2018 Target Resolution Date: 04/15/2019 Goal Status: Active Patient/caregiver will verbalize understanding of disease process and disease management Date Initiated: 09/17/2018 Date Inactivated: 09/23/2018 Target Resolution Date: 10/15/2018 Goal Status: Met Interventions: Assess peripheral edema status every visit. Compression as ordered Provide education on venous insufficiency Treatment Activities: Therapeutic compression applied : 09/17/2018 Notes: Electronic Signature(s) Signed: 03/18/2019 7:05:19 PM By: Baruch Gouty RN, BSN Entered By: Baruch Gouty on 03/18/2019 13:18:17 -------------------------------------------------------------------------------- Pain Assessment Details Patient Name: Date of Service: [name] 03/18/2019 12:30 PM Medical Record [id number] Patient Account Number:  [id number] Date of Birth/Sex: Treating RN: Mar 29, 1956 (63 y.o. [name] Primary Care Tildon Silveria: [name] Other Clinician: Referring Vernell Townley: Treating Marvelous Woolford/Extender:[name], [name], [name] in Treatment: 33 Active Problems Location of Pain Severity and Description of Pain Patient Has Paino Yes Site Locations Pain Location: Pain in Ulcers Rate the pain. Current Pain Level: 8 Worst Pain Level: 10 Least Pain Level: 0 Tolerable Pain Level: 8 Character of Pain Describe the Pain: Burning Pain Management and Medication Current Pain Management: Medication: Yes Cold Application: No Rest: Yes Massage: No Activity: No T.E.N.S.: No Heat Application: No Leg drop or elevation: Yes Is the Current Pain Management Adequate: Adequate How does your wound impact your activities of daily livingo Sleep: Yes Bathing: No Appetite: No Relationship With Others: No Bladder Continence: No Emotions: No Bowel Continence: No Work: No Toileting: No Drive: No Dressing: No Hobbies: Yes Electronic Signature(s) Signed: 03/18/2019 6:13:58 PM By: [name] Entered By: [name] on 03/18/2019 12:41:06 -------------------------------------------------------------------------------- Patient/Caregiver Education Details Patient Name: Date of Service: [name] 10/2/2020andnbsp12:30 PM Medical Record [phone] Patient Account Number: [id number] Date of Birth/Gender: Treating RN: 1955/08/18 (63 y.o. [name] Primary Care Physician: [name] Other Clinician: Referring Physician: Treating Physician/Extender:[name], [name], [name] in Treatment: 33 Education Assessment Education Provided To: Patient Education Topics Provided Venous: Methods: Explain/Verbal Responses: Reinforcements needed, State content correctly Wound/Skin Impairment: Methods: Explain/Verbal Responses: Reinforcements needed, State  content correctly Electronic Signature(s) Signed: 03/18/2019 7:05:19 PM By: Baruch Gouty RN, BSN Entered By: Baruch Gouty on 03/18/2019 13:19:42 -------------------------------------------------------------------------------- Wound Assessment Details Patient Name: Date of Service: [name] 03/18/2019 12:30 PM Medical Record [id number] Patient Account Number: [id number] Date of Birth/Sex: Treating RN: 08-31-1955 (63 y.o. [name], [name] Primary Care Madex Seals: [name] Other Clinician: Referring Sadey Yandell: Treating Cruise Baumgardner/Extender:[name], [name], [name] in Treatment: 33 Wound Status Wound Number: 1 Primary Venous Leg Ulcer Etiology: Wound Location: Right Calf - Posterior Secondary Cellulitis Wounding Event: Gradually Appeared Etiology: Date Acquired: 04/16/2018 Wound Open Weeks Of Treatment: 33 Status: Clustered Wound: No Comorbid Asthma, Sleep Apnea, Arrhythmia, History: Congestive Heart Failure, Hypertension, Type II Diabetes Photos Wound Measurements Length: (cm) 0.1 % Reduction Width: (cm) 0.1 % Reduction Depth: (cm) 0.1 Epitheliali Area: (cm) 0.008 Tunneling: Volume: (cm) 0.001 Underminin Wound Description Full Thickness  With Exposed Support Classification: Structures Wound Flat and Intact Margin: Exudate None Present Amount: Wound Bed Granulation Amount: None Present (0%) Necrotic Amount: None Present (0%) Foul Odor After Cleansing: No Slough/Fibrino Yes Exposed Structure Fascia Exposed: No Fat Layer (Subcutaneous Tissue) Exposed: Yes Tendon Exposed: No Muscle Exposed: No Joint Exposed: No Bone Exposed: No in Area: 99.9% in Volume: 100% zation: Large (67-100%) No g: No Treatment Notes Wound #1 (Right, Posterior Calf) 1. Cleanse With Soap and water 2. Periwound Care Moisturizing lotion 3. Primary Dressing Applied Calcium Alginate Ag 4. Secondary Dressing ABD Pad Kerramax/Xtrasorb 6.  Support Layer Applied 4 layer compression wrap Electronic Signature(s) Signed: 03/21/2019 4:14:10 PM By: Mikeal Hawthorne EMT/HBOT Signed: 03/21/2019 5:53:08 PM By: [name] Previous Signature: 03/18/2019 6:13:58 PM Version By: [name] Entered By: Mikeal Hawthorne on 03/21/2019 08:51:39 -------------------------------------------------------------------------------- Wound Assessment Details Patient Name: Date of Service: [name] 03/18/2019 12:30 PM Medical Record [id number] Patient Account Number: [id number] Date of Birth/Sex: Treating RN: [birth year] (63 y.o. [name], [name] Primary Care Ryden Wainer: [name] Other Clinician: Referring Tona Qualley: Treating Ayleah Hofmeister/Extender:[name], [name], [name] in Treatment: 33 Wound Status Wound Number: 11 Primary Diabetic Wound/Ulcer of the Lower Extremity Etiology: Wound Location: Left Knee Wound Open Wounding Event: Blister Status: Date Acquired: 02/25/2019 Comorbid Asthma, Sleep Apnea, Arrhythmia, Congestive Weeks Of Treatment: 2 History: Heart Failure, Hypertension, Type II Diabetes Clustered Wound: No Photos Wound Measurements Length: (cm) 0.4 % Reducti Width: (cm) 0.5 % Reducti Depth: (cm) 0.1 Epithelia Area: (cm) 0.157 Tunnelin Volume: (cm) 0.016 Undermin Wound Description Classification: Grade 1 Wound Margin: Distinct, outline attached Exudate Amount: Small Exudate Type: Serosanguineous Exudate Color: red, brown Wound Bed Granulation Amount: Large (67-100%) Granulation Quality: Red Necrotic Amount: None Present (0%) Foul Odor After Cleansing: No Slough/Fibrino No Exposed Structure Fascia Exposed: No Fat Layer (Subcutaneous Tissue) Exposed: Yes Tendon Exposed: No Muscle Exposed: No Joint Exposed: No Bone Exposed: No on in Area: 96% on in Volume: 95.9% lization: Large (67-100%) g: No ing: No Treatment Notes Wound #11 (Left Knee) 1. Cleanse With Wound Cleanser 3.  Primary Dressing Applied Calcium Alginate Ag 4. Secondary Dressing Foam Border Dressing Electronic Signature(s) Signed: 03/21/2019 4:14:10 PM By: Mikeal Hawthorne EMT/HBOT Signed: 03/21/2019 5:53:08 PM By: [name] Previous Signature: 03/18/2019 6:13:58 PM Version By: [name] Entered By: Mikeal Hawthorne on 03/21/2019 08:52:50 -------------------------------------------------------------------------------- Wound Assessment Details Patient Name: Date of Service: [name] 03/18/2019 12:30 PM Medical Record [id number] Patient Account Number: [id number] Date of Birth/Sex: Treating RN: 02-16-1956 (63 y.o. [name], [name] Primary Care Lunabella Badgett: [name] Other Clinician: Referring Erina Hamme: Treating Jayzon Taras/Extender:[name], [name], [name] in Treatment: 33 Wound Status Wound Number: 6 Primary Venous Leg Ulcer Etiology: Wound Location: Right Lower Leg - Posterior, Proximal Wound Open Status: Wounding Event: Gradually Appeared Comorbid Asthma, Sleep Apnea, Arrhythmia, Congestive Date Acquired: 01/06/2019 History: Heart Failure, Hypertension, Type II Diabetes Weeks Of Treatment: 10 Clustered Wound: No Photos Wound Measurements Length: (cm) 2 % Reduct Width: (cm) 2.1 % Reduct Depth: (cm) 0.1 Epitheli Area: (cm) 3.299 Tunneli Volume: (cm) 0.33 Undermi Wound Description Full Thickness Without Exposed Support Foul Odo Classification: Structures Slough/F Wound Flat and Intact Margin: Exudate Medium Amount: Exudate Serosanguineous Type: Exudate red, brown Color: Wound Bed Granulation Amount: Large (67-100%) Granulation Quality: Red, Pink, Friable Fascia E Necrotic Amount: None Present (0%) Fat Laye Tendon E Muscle E Joint Ex Bone Exp r After Cleansing: No ibrino No Exposed Structure xposed: No r (Subcutaneous Tissue) Exposed: Yes xposed: No xposed: No posed: No  osed: No ion in Area: -739.4% ion in Volume:  -746.2% alization: Large (67-100%) ng: No ning: No Treatment Notes Wound #6 (Right, Proximal, Posterior Lower Leg) 1. Cleanse With Soap and water 2. Periwound Care Moisturizing lotion 3. Primary Dressing Applied Calcium Alginate Ag 4. Secondary Dressing ABD Pad Kerramax/Xtrasorb 6. Support Layer Applied 4 layer compression wrap Electronic Signature(s) Signed: 03/21/2019 4:14:10 PM By: Mikeal Hawthorne EMT/HBOT Signed: 03/21/2019 5:53:08 PM By: [name] Previous Signature: 03/18/2019 6:13:58 PM Version By: [name] Entered By: Mikeal Hawthorne on 03/21/2019 08:52:02 -------------------------------------------------------------------------------- Wound Assessment Details Patient Name: Date of Service: [name] 03/18/2019 12:30 PM Medical Record [id number] Patient Account Number: [id number] Date of Birth/Sex: Treating RN: 05-Jun-1956 (63 y.o. [name], [name] Primary Care Larwence Tu: [name] Other Clinician: Referring Nashya Garlington: Treating Delsie Amador/Extender:[name], [name], [name] in Treatment: 33 Wound Status Wound Number: 7 Primary Venous Leg Ulcer Etiology: Wound Location: Right Lower Leg - Anterior Wound Open Wounding Event: Trauma Status: Date Acquired: 01/13/2019 Comorbid Asthma, Sleep Apnea, Arrhythmia, Congestive Weeks Of Treatment: 9 History: Heart Failure, Hypertension, Type II Diabetes Clustered Wound: No Photos Wound Measurements Length: (cm) 1.5 % Reduct Width: (cm) 1.4 % Reduct Depth: (cm) 0.2 Epitheli Area: (cm) 1.649 Tunneli Volume: (cm) 0.33 Undermi Wound Description Classification: Full Thickness Without Exposed Support Foul Od Structures Slough/ Wound Epibole Margin: Exudate Medium Amount: Exudate Serosanguineous Type: Exudate red, brown Color: Wound Bed Granulation Amount: Large (67-100%) Granulation Quality: Pink Fascia Necrotic Amount: Small (1-33%) Fat Layer Necrotic Quality: Adherent  Slough Tendon Ex Muscle Ex Joint Exp Bone Expo or After Cleansing: No Fibrino No Exposed Structure Exposed: No (Subcutaneous Tissue) Exposed: Yes posed: No posed: No osed: No sed: No ion in Area: -741.3% ion in Volume: -1550% alization: Small (1-33%) ng: No ning: No Treatment Notes Wound #7 (Right, Anterior Lower Leg) 1. Cleanse With Soap and water 2. Periwound Care Moisturizing lotion 3. Primary Dressing Applied Iodoflex 4. Secondary Dressing ABD Pad Kerramax/Xtrasorb 6. Support Layer Applied 4 layer compression wrap Electronic Signature(s) Signed: 03/21/2019 4:14:10 PM By: Mikeal Hawthorne EMT/HBOT Signed: 03/21/2019 5:53:08 PM By: [name] Previous Signature: 03/18/2019 6:13:58 PM Version By: [name] Entered By: Mikeal Hawthorne on 03/21/2019 08:52:27 -------------------------------------------------------------------------------- Wound Assessment Details Patient Name: Date of Service: [name] 03/18/2019 12:30 PM Medical Record [id number] Patient Account Number: [id number] Date of Birth/Sex: Treating RN: 04-30-1956 (63 y.o. [name], [name] Primary Care Chaddrick Brue: [name] Other Clinician: Referring Petrina Melby: Treating Elesia Pemberton/Extender:[name], [name], [name] in Treatment: 33 Wound Status Wound Number: 8 Primary Venous Leg Ulcer Etiology: Wound Location: Left Lower Leg - Anterior Wound Open Wounding Event: Gradually Appeared Status: Date Acquired: 02/14/2019 Comorbid Asthma, Sleep Apnea, Arrhythmia, Congestive Weeks Of Treatment: 4 History: Heart Failure, Hypertension, Type II Diabetes Clustered Wound: No Photos Wound Measurements Length: (cm) 9 % Reduct Width: (cm) 2.9 % Reduct Depth: (cm) 0.1 Epitheli Area: (cm) 20.499 Tunneli Volume: (cm) 2.05 Undermi Wound Description Classification: Full Thickness Without Exposed Support Foul Odo Structures Slough/F Wound Distinct, outline  attached Margin: Exudate Medium Amount: Exudate Serosanguineous Type: Exudate red, brown Color: Wound Bed Granulation Amount: Large (67-100%) Granulation Quality: Red Fascia E Necrotic Amount: Small (1-33%) Fat Laye Necrotic Quality: Adherent Slough Tendon E Muscle E Joint Ex Bone Exp r After Cleansing: No ibrino Yes Exposed Structure xposed: No r (Subcutaneous Tissue) Exposed: Yes xposed: No xposed: No posed: No osed: No ion in Area: -226.3% ion in Volume: -226.4% alization: Small (1-33%) ng: No ning: No Treatment Notes Wound #8 (Left, Anterior  Lower Leg) 1. Cleanse With Soap and water 2. Periwound Care Moisturizing lotion 3. Primary Dressing Applied Iodoflex 4. Secondary Dressing ABD Pad Kerramax/Xtrasorb 6. Support Layer Applied 4 layer compression wrap Electronic Signature(s) Signed: 03/21/2019 4:14:10 PM By: Mikeal Hawthorne EMT/HBOT Signed: 03/21/2019 5:53:08 PM By: [name] Previous Signature: 03/18/2019 6:13:58 PM Version By: [name] Entered By: Mikeal Hawthorne on 03/21/2019 08:53:36 -------------------------------------------------------------------------------- Vitals Details Patient Name: Date of Service: [name]. 03/18/2019 12:30 PM Medical Record [id number] Patient Account Number: [id number] Date of Birth/Sex: Treating RN: [birth year] (63 y.o. [name], [name] Primary Care [place] Redwine: [name] Other Clinician: Referring Osman Calzadilla: Treating Guenevere Roorda/Extender:[name], [name], [name] in Treatment: 33 Vital Signs Time Taken: 12:38 Temperature (F): 98.6 Height (in): 73 Pulse (bpm): 70 Weight (lbs): 290 Respiratory Rate (breaths/min): 16 Body Mass Index (BMI): 38.3 Blood Pressure (mmHg): 145/89 Reference Range: 80 - 120 mg / dl Electronic Signature(s) Signed: 03/18/2019 6:13:58 PM By: [name] Entered By: [name] on 03/18/2019 12:43:18

## 2019-03-22 ENCOUNTER — Other Ambulatory Visit: Payer: Self-pay | Admitting: Internal Medicine

## 2019-03-24 ENCOUNTER — Encounter (HOSPITAL_BASED_OUTPATIENT_CLINIC_OR_DEPARTMENT_OTHER): Payer: BLUE CROSS/BLUE SHIELD | Admitting: Internal Medicine

## 2019-03-24 ENCOUNTER — Encounter: Payer: Self-pay | Admitting: Cardiology

## 2019-03-24 ENCOUNTER — Ambulatory Visit: Payer: BLUE CROSS/BLUE SHIELD | Admitting: Cardiology

## 2019-03-24 ENCOUNTER — Other Ambulatory Visit: Payer: Self-pay

## 2019-03-24 VITALS — BP 154/100 | HR 73 | Temp 96.8°F | Ht 73.0 in | Wt 279.0 lb

## 2019-03-24 DIAGNOSIS — I10 Essential (primary) hypertension: Secondary | ICD-10-CM

## 2019-03-24 DIAGNOSIS — I480 Paroxysmal atrial fibrillation: Secondary | ICD-10-CM | POA: Diagnosis not present

## 2019-03-24 DIAGNOSIS — I5042 Chronic combined systolic (congestive) and diastolic (congestive) heart failure: Secondary | ICD-10-CM | POA: Diagnosis not present

## 2019-03-24 DIAGNOSIS — I420 Dilated cardiomyopathy: Secondary | ICD-10-CM | POA: Diagnosis not present

## 2019-03-24 MED ORDER — CARVEDILOL 6.25 MG PO TABS: 6.2500 mg | ORAL_TABLET | Freq: Two times a day (BID) | ORAL | 11 refills | Status: DC

## 2019-03-24 MED ORDER — ISOSORBIDE MONONITRATE ER 30 MG PO TB24: 30.0000 mg | ORAL_TABLET | Freq: Every day | ORAL | 11 refills | Status: DC

## 2019-03-24 MED ORDER — HYDRALAZINE HCL 50 MG PO TABS: 50.0000 mg | ORAL_TABLET | Freq: Two times a day (BID) | ORAL | 11 refills | Status: DC

## 2019-03-24 NOTE — Progress Notes (Signed)
Cardiology Office Note:    Date:  03/24/2019   ID:  [name], DOB [birth year], MRN [name]  PCP:  [name], MD  Cardiologist:  Buford Dresser, MD PhD  Referring MD: [name], *   CC: follow up  History of Present Illness:    [name] is a 63 y.o. male with a hx of persistent atrial fibrillation, atrial flutter with prior ablation in 2012, prior cardioversions, HTN, chronic systolic heart failure (thought to be tachycardia induced) with EF 30-35% on tte 04/2018, OSA who is seen in follow up today. I first saw him as a new consult 12/28/18.  Cardiac history: 12/14/18 Was planned for afib ablation. However, TEE performed was concerning for thrombus. Also noted to have EF 30%, severe biatrial enlargement. 04/2018: TTE, EF 30-35%, diffuse hypokinesis, biatrial enlargement  Cardiovascular risk factors: Prior clinical ASCVD: no known ASCVD, no prior cath Comorbid conditions, including hypertension, hyperlipidemia, diabetes, chronic kidney disease: yes: HTN, DM, HLD. No formal CKD, but Cr 2.11, had been 1.5-1.6  Today: Remains asymptomatic from prior Covid test. Discussed lexiscan (would ned 2 day for best pics at [facility]), and I would do echo first to avoid radiation exposure for tech.  Denies chest pain, shortness of breath at rest or with normal exertion. No PND, orthopnea, or unexpected weight gain. No syncope or palpitations. Getting wound care for bilateral legs. Feels pain is not well controlled with tramadol, has used hydrocodone in the past. I do not prescribe these meds, referred back to Dr. Ernestine Mcmurray for pain management for legs.  BP with Dr. Sharlet Salina was well controlled last week, up slightly today. Can't cut carvedilol in half, so has been taking 1 pill one a day instead of 1/2 BID. Believes this is a 6.25 mg pill he was told to cut in half. BP/HR today can tolerate higher dose, will increase to 1 pill BID today. Taking  hydralazine AM/PM.  Past Medical History:  Diagnosis Date  . Allergy    SEASONAL  . Asthma   . Atrial fibrillation ([place])    persistant 02/2009  . Atrial flutter ([place])    s/p CTI ablation 08/06/10  . Cardiac arrhythmia due to congenital heart disease   . Cataract    BEGINING  . Chronic rhinitis   . Chronic systolic dysfunction of left ventricle   . Colon polyp   . Congestive heart failure ([place])   . Diverticulosis    colonoscopy 04/03/2009  . Hyperlipidemia   . Hypertension   . Hypertension   . Morbid obesity ([place])    target weight = 219  for BMI < 30  . Nonischemic cardiomyopathy (HCC)    tachycardia mediated  . Sleep apnea    original 2001    Past Surgical History:  Procedure Laterality Date  . ATRIAL FIBRILLATION ABLATION N/A 12/14/2018   Procedure: ATRIAL FIBRILLATION ABLATION;  Surgeon: Thompson Grayer, MD;  Location: [place];  Service: Cardiovascular;  Laterality: N/A;  . atrial flutter ablation  08/06/10  . CARDIOVERSION N/A 07/05/2018   Procedure: CARDIOVERSION;  Surgeon: Buford Dresser, MD;  Location: [facility] ENDOSCOPY;  Service: Cardiovascular;  Laterality: N/A;  . COLONOSCOPY    . TEE WITHOUT CARDIOVERSION N/A 12/14/2018   Procedure: TRANSESOPHAGEAL ECHOCARDIOGRAM (TEE);  Surgeon: Thompson Grayer, MD;  Location: [facility];  Service: Cardiovascular;  Laterality: N/A;    Current Medications: Current Outpatient Medications on File Prior to Visit  Medication Sig  . albuterol (PROVENTIL HFA;VENTOLIN  HFA) 108 (90 Base) MCG/ACT inhaler Inhale 2 puffs into the lungs every 6 (six) hours as needed for wheezing or shortness of breath.  [name] amiodarone (PACERONE) 200 MG tablet Take 1 tablet (200 mg total) by mouth daily.  . carvedilol (COREG) 3.125 MG tablet TAKE 1 TABLET TWICE DAILY  . dabigatran (PRADAXA) 150 MG CAPS capsule Take 1 capsule (150 mg total) by mouth 2 (two) times daily.  [name] doxylamine, Sleep, (UNISOM) 25 MG tablet Take 50 mg by mouth at bedtime  as needed for sleep.   . furosemide (LASIX) 40 MG tablet TAKE 1 TABLET TWICE DAILY  . gabapentin (NEURONTIN) 300 MG capsule TAKE 1 CAPSULE BY MOUTH THREE TIMES A DAY  . glipiZIDE (GLUCOTROL) 5 MG tablet TAKE 1 TABLET BY MOUTH TWICE A DAY BEFORE MEALS  . hydrALAZINE (APRESOLINE) 50 MG tablet TAKE 1 TABLET EVERY 8 HOURS  . isosorbide mononitrate (IMDUR) 30 MG 24 hr tablet TAKE 1 TABLET EVERY DAY  . levothyroxine (SYNTHROID) 50 MCG tablet TAKE 1 TABLET EVERY DAY BEFORE BREAKFAST  . mometasone (NASONEX) 50 MCG/ACT nasal spray Place 2 sprays into the nose daily as needed (allergies or rhinitis).   . rosuvastatin (CRESTOR) 10 MG tablet TAKE 1 TABLET BY MOUTH EVERY DAY  . spironolactone (ALDACTONE) 25 MG tablet Take 25 mg by mouth daily.  . SYMBICORT 80-4.5 MCG/ACT inhaler USE 2 INHALATIONS TWICE A DAY  . vitamin C (ASCORBIC ACID) 500 MG tablet Take 500-1,000 mg by mouth daily.   No current facility-administered medications on file prior to visit.      Allergies:   Patient has no known allergies.   Social History   Socioeconomic History  . Marital status: Single    Spouse name: Not on file  . Number of children: 0  . Years of education: Not on file  . Highest education level: Not on file  Occupational History  . Occupation: Press photographer  Social Needs  . Financial resource strain: Not on file  . Food insecurity    Worry: Not on file    Inability: Not on file  . Transportation needs    Medical: Not on file    Non-medical: Not on file  Tobacco Use  . Smoking status: Never Smoker  . Smokeless tobacco: Never Used  Substance and Sexual Activity  . Alcohol use: Yes    Alcohol/week: 0.0 standard drinks    Comment: previously quite heavy, working on cessation   . Drug use: No  . Sexual activity: Not on file  Lifestyle  . Physical activity    Days per week: Not on file    Minutes per session: Not on file  . Stress: Not on file  Relationships  . Social Herbalist on phone: Not on  file    Gets together: Not on file    Attends religious service: Not on file    Active member of club or organization: Not on file    Attends meetings of clubs or organizations: Not on file    Relationship status: Not on file  Other Topics Concern  . Not on file  Social History Narrative   Lives in [place] and works for [name]     Family History: The patient's family history includes COPD in his father; Colon cancer in his brother; Colon cancer (age of onset: 11) in his father; Hypertension in his mother; Kidney failure in his mother. There is no history of Colon polyps, Diabetes, Kidney disease, or Esophageal cancer.  ROS:   Please see the history of present illness.  Additional pertinent ROS: Constitutional: Negative for chills, fever, night sweats, unintentional weight loss  HENT: Negative for ear pain and hearing loss.   Eyes: Negative for loss of vision and eye pain.  Respiratory: Negative for cough, sputum, wheezing.   Cardiovascular: See HPI. Gastrointestinal: Negative for abdominal pain, melena, and hematochezia.  Genitourinary: Negative for dysuria and hematuria.  Musculoskeletal: Negative for falls and myalgias.  Skin: Negative for itching and rash.  Neurological: Negative for focal weakness, focal sensory changes and loss of consciousness.  Endo/Heme/Allergies: Does not bruise/bleed easily.   EKGs/Labs/Other Studies Reviewed:    The following studies were reviewed today: TTE May 27, 2018 Left ventricle: The cavity size was normal. There was severe   concentric hypertrophy. Systolic function was moderately to   severely reduced. The estimated ejection fraction was in the   range of 30% to 35%. Diffuse hypokinesis. The study was not   technically sufficient to allow evaluation of LV diastolic   dysfunction due to atrial fibrillation. - Aortic valve: There was no regurgitation. - Aortic root: The aortic root was normal in size. - Mitral valve: There was trivial  regurgitation. - Left atrium: The atrium was severely dilated. - Right ventricle: Systolic function was normal. - Right atrium: The atrium was moderately dilated. - Tricuspid valve: There was trivial regurgitation. - Pulmonic valve: Transvalvular velocity was within the normal   range. There was no evidence for stenosis. - Pulmonary arteries: Systolic pressure was within the normal   range. - Inferior vena cava: The vessel was normal in size. The   respirophasic diameter changes were in the normal range (= 50%),   consistent with normal central venous pressure. - Pericardium, extracardiac: There was no pericardial effusion.  Impressions: - When compared to the prior study from 04/02/2017 LVEF has   decreased from 55-60% to 30-35% with diffuse hypokinesis.  TEE 12/14/18  1. The left ventricle has moderate-severely reduced systolic function, with an ejection fraction of 30-35%. The cavity size was moderately dilated. Left ventrical global hypokinesis without regional wall motion abnormalities.  2. The right ventricle has normal systolc function. The cavity was normal. There is no increase in right ventricular wall thickness.  3. Left atrial size was severely dilated.  4. Dense smoke in atria and appendage. Stagnant pre thrombus and likely thrombus in mid to distal LAA High risk for embolic event EP procedure cancelled.  5. Right atrial size was moderately dilated.  6. The mitral valve is grossly normal. Mild thickening of the mitral valve leaflet. Mild calcification of the mitral valve leaflet. Mitral valve regurgitation is mild to moderate by color flow Doppler.  7. Restricted posterior leaflet motion.  8. The aortic valve is tricuspid Moderate thickening of the aortic valve Sclerosis without any evidence of stenosis of the aortic valve. Aortic valve regurgitation is trivial by color flow Doppler.  9. The aortic root is normal in size and structure.  EKG:  EKG is personally reviewed.   The ekg ordered today demonstrates SR, RBBB, LAFB  Recent Labs: 02/02/2019: TSH 13.910 02/05/2019: B Natriuretic Peptide 382.6; Magnesium 1.8 03/08/2019: ALT 14; BUN 25; Creatinine, Ser 1.50; Hemoglobin 14.2; Platelets 255.0; Potassium 4.3; Pro B Natriuretic peptide (BNP) 215.0; Sodium 131  Recent Lipid Panel    Component Value Date/Time   CHOL 136 03/08/2019 0946   TRIG 58.0 03/08/2019 0946   HDL 64.10 03/08/2019 0946   CHOLHDL 2 03/08/2019 0946   VLDL 11.6 03/08/2019 0946  LDLCALC 61 03/08/2019 0946   LDLDIRECT 81.0 08/31/2015 0905    Physical Exam:    VS:  BP (!) 154/100   Pulse 73   Temp (!) 96.8 F (36 C)   Ht 6\' 1"  (1.854 m)   Wt 279 lb (126.6 kg)   SpO2 97%   BMI 36.81 kg/m     Wt Readings from Last 3 Encounters:  03/24/19 279 lb (126.6 kg)  03/08/19 284 lb (128.8 kg)  02/22/19 282 lb (127.9 kg)    GEN: Well nourished, well developed in no acute distress HEENT: Normal, moist mucous membranes NECK: No JVD CARDIAC: regular rhythm, normal S1 and S2, no murmurs, rubs, gallops.  VASCULAR: Radial and DP pulses 2+ bilaterally. No carotid bruits RESPIRATORY:  Clear to auscultation without rales, wheezing or rhonchi  ABDOMEN: Soft, non-tender, non-distended MUSCULOSKELETAL:  Ambulates independently SKIN:  Bandaged wounds, with multiple areas of discoloration across lower extremities. Trace bilateral brawny edema NEUROLOGIC:  Alert and oriented x 3. No focal neuro deficits noted. PSYCHIATRIC:  Normal affect   ASSESSMENT:    1. Dilated cardiomyopathy ([place])   2. Essential hypertension   3. Chronic combined systolic and diastolic heart failure (HCC)   4. Paroxysmal atrial fibrillation (HCC)    PLAN:    Dilated cardiomyopathy, unknown etiology, with chronic systolic and diastolic heart failure:  -euvolemic, NYHA class I -had planned for [facility], then had asymptomatic covid positive test, then renal failure -we discussed options at length. Given his recent renal  failure, I agree that avoiding contrast is preferable -will plan to repeat echocardiogram, then do 2 day lexiscan nuclear stress at [facility] ([facility] camera) to evaluate for ischemia. -will increase to 6.25 mg BID carvedilol as he was only taking this once/daily (and was tolerating that dose). Will monitor for bradycardia/hypotension Given these instructions: If BP less than 110 on the top or 60 on the bottom, or if heart rate consistent less than 60, call the office and we will adjust your carvedilol. -changed to hydralazine (only taking BID) and imdur during recent hospitalization given AKI. Spironolactone continued at discharge, need to monitor closely. K 4.3, Cr 1.50 on 9/22 -no ACEI/ARB/ARNI given AKI and hyperkalemia -changed to BID furosemide at discharge, tolerating  History of atrial fibrillation and atrial flutter: CHADSVASC=3 S/p CTI ablation 2012 Has OSA, encouraged adherence to CPAP Continue amiodarone. Levothyroxine added during recent hospitalization Continue dabigatran Ablation aborted due to thrombus in LAA.  Baseline conduction disease Follow LFTs/PFTs/TFTs on amiodarone. Ideally would like to avoid long term amiodarone  Hypertension: Goal <130/80, elevated todat -continue carvedilol, hydralazine-imdur, furosemide as above  Cardiac risk counseling and prevention recommendations: -recommend heart healthy/Mediterranean diet, with whole grains, fruits, vegetable, fish, lean meats, nuts, and olive oil. Limit salt. -recommend moderate walking, 3-5 times/week for 30-50 minutes each session. Aim for at least 150 minutes.week. Goal should be pace of 3 miles/hours, or walking 1.5 miles in 30 minutes -recommend avoidance of tobacco products. Avoid excess alcohol. -continue rosuvastatin given diabetes, ASCVD risk. -ASCVD risk score: The 10-year ASCVD risk score Mikey Bussing DC Brooke Bonito., et al., 2013) is: 22.3%   Values used to calculate the score:     Age: 59 years     Sex: Male     Is  Non-Hispanic African American: No     Diabetic: Yes     Tobacco smoker: No     Systolic Blood Pressure: 123456 mmHg     Is BP treated: Yes     HDL Cholesterol: 64.1 mg/dL  Total Cholesterol: 136 mg/dL    Plan for follow up: 3 mos  TIME SPENT WITH PATIENT: 40 minutes of direct patient care. More than 50% of that time was spent on coordination of care and counseling regarding plans forward, options for ischemia evaluation, medication review.  Buford Dresser, MD, PhD [place]  [facility]   Medication Adjustments/Labs and Tests Ordered: Current medicines are reviewed at length with the patient today.  Concerns regarding medicines are outlined above.  Orders Placed This Encounter  Procedures  . MYOCARDIAL PERFUSION IMAGING  . EKG 12-Lead  . ECHOCARDIOGRAM COMPLETE   Meds ordered this encounter  Medications  . carvedilol (COREG) 6.25 MG tablet    Sig: Take 1 tablet (6.25 mg total) by mouth 2 (two) times daily with a meal.    Dispense:  60 tablet    Refill:  11  . isosorbide mononitrate (IMDUR) 30 MG 24 hr tablet    Sig: Take 1 tablet (30 mg total) by mouth daily.    Dispense:  30 tablet    Refill:  11  . hydrALAZINE (APRESOLINE) 50 MG tablet    Sig: Take 1 tablet (50 mg total) by mouth 2 (two) times daily.    Dispense:  60 tablet    Refill:  11    Patient Instructions  Medication Instructions:  Your Physician recommend you continue on your current medication as directed.    If BP less than 110 on the top or 60 on the bottom, or if heart rate consistent less than 60, call the office and we will adjust your carvedilol.  If you need a refill on your cardiac medications before your next appointment, please call your pharmacy.   Lab work: None  Testing/Procedures: Your physician has requested that you have an echocardiogram. Echocardiography is a painless test that uses sound waves to create images of your heart. It provides your doctor with information about  the size and shape of your heart and how well your heart's chambers and valves are working. This procedure takes approximately one hour. There are no restrictions for this procedure. [place] has requested that you have a lexiscan myoview. For further information please visit [email or web address]. Please follow instruction sheet, as given. [place] [address]     Follow-Up: At [facility], you and your health needs are our priority.  As part of our continuing mission to provide you with exceptional heart care, we have created designated Provider Care Teams.  These Care Teams include your primary Cardiologist (physician) and Advanced Practice Providers (APPs -  Physician Assistants and Nurse Practitioners) who all work together to provide you with the care you need, when you need it. You will need a follow up appointment in 3 months.  Please call our office 2 months in advance to schedule this appointment.  You may see Buford Dresser, MD or one of the following Advanced Practice Providers on your designated Care Team:   Rosaria Ferries, PA-C . Jory Sims, DNP, ANP  You are scheduled for a Myocardial Perfusion Imaging Study.  Please arrive 15 minutes prior to your appointment time for registration and insurance purposes.  The test will take approximately 3 to 4 hours to complete; you may bring reading material.  If someone comes with you to your appointment, they will need to remain in the main lobby due to limited space in the testing area. **If you are pregnant or breastfeeding, please notify the nuclear  lab prior to your appointment**  How to prepare for your Myocardial Perfusion Test: . Do not eat or drink 3 hours prior to your test, except you may have water. . Do not consume products containing caffeine (regular or decaffeinated) 12 hours prior to your test. (ex: coffee, chocolate, sodas, tea). . Do bring a list of your  current medications with you.  If not listed below, you may take your medications as normal. . Do wear comfortable clothes (no dresses or overalls) and walking shoes, tennis shoes preferred (No heels or open toe shoes are allowed). . Do NOT wear cologne, perfume, aftershave, or lotions (deodorant is allowed). . If these instructions are not followed, your test will have to be rescheduled.  Please report to [address], [address] for your test.  If you have questions or concerns about your appointment, you can call the Nuclear Lab at [phone].  If you cannot keep your appointment, please provide 24 hours notification to the Nuclear Lab, to avoid a possible $50 charge to your account.     Signed, Buford Dresser, MD PhD 03/24/2019  [place]

## 2019-03-24 NOTE — Patient Instructions (Addendum)
Medication Instructions:  Your Physician recommend you continue on your current medication as directed.    If BP less than 110 on the top or 60 on the bottom, or if heart rate consistent less than 60, call the office and we will adjust your carvedilol.  If you need a refill on your cardiac medications before your next appointment, please call your pharmacy.   Lab work: None  Testing/Procedures: Your physician has requested that you have an echocardiogram. Echocardiography is a painless test that uses sound waves to create images of your heart. It provides your doctor with information about the size and shape of your heart and how well your heart's chambers and valves are working. This procedure takes approximately one hour. There are no restrictions for this procedure. [place] has requested that you have a lexiscan myoview. For further information please visit [email or web address]. Please follow instruction sheet, as given. [place] [address]     Follow-Up: At [facility], you and your health needs are our priority.  As part of our continuing mission to provide you with exceptional heart care, we have created designated Provider Care Teams.  These Care Teams include your primary Cardiologist (physician) and Advanced Practice Providers (APPs -  Physician Assistants and Nurse Practitioners) who all work together to provide you with the care you need, when you need it. You will need a follow up appointment in 3 months.  Please call our office 2 months in advance to schedule this appointment.  You may see Buford Dresser, MD or one of the following Advanced Practice Providers on your designated Care Team:   Rosaria Ferries, PA-C . Jory Sims, DNP, ANP  You are scheduled for a Myocardial Perfusion Imaging Study.  Please arrive 15 minutes prior to your appointment time for registration and insurance purposes.  The test will take  approximately 3 to 4 hours to complete; you may bring reading material.  If someone comes with you to your appointment, they will need to remain in the main lobby due to limited space in the testing area. **If you are pregnant or breastfeeding, please notify the nuclear lab prior to your appointment**  How to prepare for your Myocardial Perfusion Test: . Do not eat or drink 3 hours prior to your test, except you may have water. . Do not consume products containing caffeine (regular or decaffeinated) 12 hours prior to your test. (ex: coffee, chocolate, sodas, tea). . Do bring a list of your current medications with you.  If not listed below, you may take your medications as normal. . Do wear comfortable clothes (no dresses or overalls) and walking shoes, tennis shoes preferred (No heels or open toe shoes are allowed). . Do NOT wear cologne, perfume, aftershave, or lotions (deodorant is allowed). . If these instructions are not followed, your test will have to be rescheduled.  Please report to [address] for your test.  If you have questions or concerns about your appointment, you can call the Nuclear Lab at [phone].  If you cannot keep your appointment, please provide 24 hours notification to the Nuclear Lab, to avoid a possible $50 charge to your account.

## 2019-03-25 ENCOUNTER — Encounter (HOSPITAL_BASED_OUTPATIENT_CLINIC_OR_DEPARTMENT_OTHER): Payer: BLUE CROSS/BLUE SHIELD | Admitting: Internal Medicine

## 2019-03-25 DIAGNOSIS — E11622 Type 2 diabetes mellitus with other skin ulcer: Secondary | ICD-10-CM | POA: Diagnosis not present

## 2019-03-25 DIAGNOSIS — I87313 Chronic venous hypertension (idiopathic) with ulcer of bilateral lower extremity: Secondary | ICD-10-CM | POA: Diagnosis not present

## 2019-03-25 DIAGNOSIS — I87321 Chronic venous hypertension (idiopathic) with inflammation of right lower extremity: Secondary | ICD-10-CM | POA: Diagnosis not present

## 2019-03-25 DIAGNOSIS — L97822 Non-pressure chronic ulcer of other part of left lower leg with fat layer exposed: Secondary | ICD-10-CM | POA: Diagnosis not present

## 2019-03-25 DIAGNOSIS — G4730 Sleep apnea, unspecified: Secondary | ICD-10-CM | POA: Diagnosis not present

## 2019-03-25 DIAGNOSIS — I110 Hypertensive heart disease with heart failure: Secondary | ICD-10-CM | POA: Diagnosis not present

## 2019-03-25 DIAGNOSIS — L97812 Non-pressure chronic ulcer of other part of right lower leg with fat layer exposed: Secondary | ICD-10-CM | POA: Diagnosis not present

## 2019-03-25 DIAGNOSIS — I509 Heart failure, unspecified: Secondary | ICD-10-CM | POA: Diagnosis not present

## 2019-03-25 DIAGNOSIS — L97212 Non-pressure chronic ulcer of right calf with fat layer exposed: Secondary | ICD-10-CM | POA: Diagnosis not present

## 2019-03-29 ENCOUNTER — Ambulatory Visit (HOSPITAL_COMMUNITY): Payer: BLUE CROSS/BLUE SHIELD | Attending: Cardiology

## 2019-03-29 ENCOUNTER — Other Ambulatory Visit: Payer: Self-pay | Admitting: Internal Medicine

## 2019-03-29 ENCOUNTER — Other Ambulatory Visit: Payer: Self-pay

## 2019-03-29 DIAGNOSIS — I5022 Chronic systolic (congestive) heart failure: Secondary | ICD-10-CM | POA: Insufficient documentation

## 2019-03-29 DIAGNOSIS — I10 Essential (primary) hypertension: Secondary | ICD-10-CM

## 2019-03-29 NOTE — Progress Notes (Signed)
[name], [name] ([id number]) Visit Report for 03/25/2019 Arrival Information Details Patient Name: Date of Service: [name], [name] 03/25/2019 1:15 PM Medical Record [id number] Patient Account Number: [id number] Date of Birth/Sex: Treating RN: [birth year] (63 y.o. [name] Primary Care [name]: [name] Other Clinician: Referring [name]: Treating [name]/Extender:[name], [name], [name] in Treatment: 10 Visit Information History Since Last Visit Added or deleted any medications: No Patient Arrived: Ambulatory Any new allergies or adverse reactions: No Arrival Time: 12:55 Had a fall or experienced change in No Accompanied By: self activities of daily living that may affect Transfer Assistance: None risk of falls: Patient Identification Verified: Yes Signs or symptoms of abuse/neglect since last No Secondary Verification Process Yes visito Completed: Hospitalized since last visit: No Patient Requires Transmission- No Implantable device outside of the clinic excluding No Based Precautions: cellular tissue based products placed in the center Patient Has Alerts: Yes since last visit: Patient Alerts: Patient on Blood Has Dressing in Place as Prescribed: Yes Thinner Pain Present Now: No xarelto R TBI = .86, L TBI = .87 Electronic Signature(s) Signed: 03/25/2019 5:13:27 PM By: Kela Millin Entered By: Kela Millin on 03/25/2019 13:07:09 -------------------------------------------------------------------------------- Compression Therapy Details Patient Name: Date of Service: [name] 03/25/2019 1:15 PM Medical Record [id number] Patient Account Number: [id number] Date of Birth/Sex: Treating RN: 18-Nov-1955 (63 y.o. [name] Primary Care [name]: [name] Other Clinician: Referring [name]: Treating [name]/Extender:[name], [name], [name] in Treatment:  34 Compression Therapy Performed for Wound Wound #1 Right,Posterior Calf Assessment: Performed By: Clinician [name] Hurst, RN Compression Type: Four Layer Post Procedure Diagnosis Same as Pre-procedure Electronic Signature(s) Signed: 03/29/2019 5:52:09 PM By: [name] Hurst RN, BSN Entered By: [name] on 03/25/2019 14:13:09 -------------------------------------------------------------------------------- Compression Therapy Details Patient Name: Date of Service: [name] 03/25/2019 1:15 PM Medical Record [id number] Patient Account Number: [id number] Date of Birth/Sex: Treating RN: 01/09/1956 (63 y.o. [name] Primary Care [name]: [name] Other Clinician: Referring Roniyah Llorens: Treating [name]/Extender:[name], [name], [name] in Treatment: 34 Compression Therapy Performed for Wound Wound #6 Right,Proximal,Posterior Lower Leg Assessment: Performed By: Clinician [name] Hurst, RN Compression Type: Four Layer Post Procedure Diagnosis Same as Pre-procedure Electronic Signature(s) Signed: 03/29/2019 5:52:09 PM By: [name] Hurst RN, BSN Entered By: [name] on 03/25/2019 14:13:10 -------------------------------------------------------------------------------- Compression Therapy Details Patient Name: Date of Service: [name] 03/25/2019 1:15 PM Medical Record [id number] Patient Account Number: [id number] Date of Birth/Sex: Treating RN: 1956-03-10 (63 y.o. [name] Primary Care [name]: [name] Other Clinician: Referring [name]: Treating [name]/Extender:[name], [name], [name] in Treatment: 34 Compression Therapy Performed for Wound Wound #7 Right,Anterior Lower Leg Assessment: Performed By: Clinician [name] Hurst, RN Compression Type: Four Layer Post Procedure Diagnosis Same as Pre-procedure Electronic Signature(s) Signed: 03/29/2019 5:52:09 PM  By: [name] Hurst RN, BSN Entered By: [name] on 03/25/2019 14:13:10 -------------------------------------------------------------------------------- Compression Therapy Details Patient Name: Date of Service: [name] 03/25/2019 1:15 PM Medical Record [id number] Patient Account Number: [id number] Date of Birth/Sex: Treating RN: 01/02/1956 (63 y.o. [name] Primary Care [name]: [name] Other Clinician: Referring [name]: Treating [name]/Extender:[name], [name], [name] in Treatment: 34 Compression Therapy Performed for Wound Wound #8 Left,Anterior Lower Leg Assessment: Performed By: Clinician [name] Hurst, RN Compression Type: Four Layer Post Procedure Diagnosis Same as Pre-procedure Electronic Signature(s) Signed: 03/29/2019 5:52:09 PM By: [name] Hurst RN, BSN Entered By: [name] on 03/25/2019 14:13:10 -------------------------------------------------------------------------------- Encounter Discharge Information Details Patient Name: Date of Service: [name]. 03/25/2019  1:15 PM Medical Record [id number] Patient Account Number: [id number] Date of Birth/Sex: Treating RN: [birth year] (63 y.o. [name] Primary Care [name]: [name] Other Clinician: Referring [name]: Treating [name]/Extender:[name], [name], [name] in Treatment: 34 Encounter Discharge Information Items Discharge Condition: Stable Ambulatory Status: Ambulatory Discharge Destination: Home Transportation: Private Auto Accompanied By: self Schedule Follow-up Appointment: Yes Clinical Summary of Care: Electronic Signature(s) Signed: 03/25/2019 6:01:54 PM By: [name] Entered By: [name] on 03/25/2019 14:45:27 -------------------------------------------------------------------------------- Lower Extremity Assessment Details Patient Name: Date of Service: [name], [name] 03/25/2019 1:15 PM Medical Record [id number] Patient Account Number: [id number] Date of Birth/Sex: Treating RN: [birth year] (63 y.o. [name] Primary Care [name]: [name] Other Clinician: Referring Lekesha Claw: Treating Ignacia Gentzler/Extender:[name], [name], [name] in Treatment: 34 Edema Assessment Assessed: [Left: No] [Right: No] Edema: [Left: Yes] [Right: No] Calf Left: Right: Point of Measurement: cm From Medial Instep 36.6 cm 37 cm Ankle Left: Right: Point of Measurement: cm From Medial Instep 24 cm 22.7 cm Vascular Assessment Pulses: Dorsalis Pedis Palpable: [Left:Yes] [Right:Yes] Electronic Signature(s) Signed: 03/25/2019 5:13:27 PM By: Kela Millin Entered By: Kela Millin on 03/25/2019 13:09:28 -------------------------------------------------------------------------------- Multi-Disciplinary Care Plan Details Patient Name: Date of Service: [name] 03/25/2019 1:15 PM Medical Record [id number] Patient Account Number: [id number] Date of Birth/Sex: Treating RN: Nov 25, 1955 (63 y.o. [name] Primary Care Keely Drennan: [name] Other Clinician: Referring Olumide Dolinger: Treating Cheryle Dark/Extender:[name], [name], [name] in Treatment: 34 Active Inactive Venous Leg Ulcer Nursing Diagnoses: Actual venous Insuffiency (use after diagnosis is confirmed) Knowledge deficit related to disease process and management Goals: Non-invasive venous studies are completed as ordered Date Initiated: 09/17/2018 Date Inactivated: 09/23/2018 Target Resolution Date: 09/23/2018 Goal Status: Met Patient will maintain optimal edema control Date Initiated: 09/17/2018 Target Resolution Date: 04/15/2019 Goal Status: Active Patient/caregiver will verbalize understanding of disease process and disease management Date Initiated: 09/17/2018 Date Inactivated: 09/23/2018 Target Resolution Date: 10/15/2018 Goal Status:  Met Interventions: Assess peripheral edema status every visit. Compression as ordered Provide education on venous insufficiency Treatment Activities: Therapeutic compression applied : 09/17/2018 Notes: Electronic Signature(s) Signed: 03/29/2019 5:52:09 PM By: [name] Hurst RN, BSN Entered By: [name] on 03/25/2019 13:45:16 -------------------------------------------------------------------------------- Pain Assessment Details Patient Name: Date of Service: [name] 03/25/2019 1:15 PM Medical Record [id number] Patient Account Number: [id number] Date of Birth/Sex: Treating RN: 10/05/1955 (63 y.o. [name] Primary Care Jeany Seville: [name] Other Clinician: Referring Cierah Crader: Treating Abdirahman Chittum/Extender:[name], [name], [name] in Treatment: 34 Active Problems Location of Pain Severity and Description of Pain Patient Has Paino Yes Site Locations Pain Location: Pain in Ulcers With Dressing Change: Yes Duration of the Pain. Constant / Intermittento Constant Rate the pain. Current Pain Level: 6 Worst Pain Level: 7 Least Pain Level: 4 Tolerable Pain Level: 6 Character of Pain Describe the Pain: Burning, Stabbing Pain Management and Medication Current Pain Management: Electronic Signature(s) Signed: 03/25/2019 5:13:27 PM By: Kela Millin Signed: 03/29/2019 5:52:09 PM By: [name] Hurst RN, BSN Entered By: Kela Millin on 03/25/2019 13:07:39 -------------------------------------------------------------------------------- Patient/Caregiver Education Details Patient Name: Date of Service: [name] 10/9/2020andnbsp1:15 PM Medical Record [id number] Patient Account Number: [id number] Date of Birth/Gender: Treating RN: 1955/12/22 (63 y.o. [name] Primary Care Physician: [name] Other Clinician: Referring Physician: Treating Physician/Extender:[name], [name],  [name] in Treatment: 34 Education Assessment Education Provided To: Patient Education Topics Provided Wound/Skin Impairment: Methods: Explain/Verbal Responses: State content correctly [employer]) Signed: 03/29/2019 5:52:09 PM By: [name] Hurst RN, BSN Entered By: Donnal Debar,  Shatara on 03/25/2019 13:45:28 -------------------------------------------------------------------------------- Wound Assessment Details Patient Name: Date of Service: [name], [name] 03/25/2019 1:15 PM Medical Record [id number] Patient Account Number: [id number] Date of Birth/Sex: Treating RN: Jan 27, 1956 (63 y.o. [name] Primary Care Mafalda Mcginniss: [name] Other Clinician: Referring Vansh Reckart: Treating Marzetta Lanza/Extender:[name], [name], [name] in Treatment: 34 Wound Status Wound Number: 1 Primary Venous Leg Ulcer Etiology: Wound Location: Right Calf - Posterior Secondary Cellulitis Wounding Event: Gradually Appeared Etiology: Date Acquired: 04/16/2018 Wound Open Weeks Of Treatment: 34 Status: Clustered Wound: No Comorbid Asthma, Sleep Apnea, Arrhythmia, History: Congestive Heart Failure, Hypertension, Type II Diabetes Photos Wound Measurements Length: (cm) 0.6 % Reduction Width: (cm) 0.5 % Reduction Depth: (cm) 0.1 Epitheliali Area: (cm) 0.236 Tunneling: Volume: (cm) 0.024 Underminin Wound Description Classification: Full Thickness With Exposed Support Foul Odor Structures Slough/Fib Wound Flat and Intact Margin: Exudate Small Amount: Exudate Type: Serosanguineous Exudate red, brown Color: Wound Bed Granulation Amount: Large (67-100%) Granulation Quality: Red, Pink Fascia Expo Necrotic Amount: None Present (0%) Fat Layer ( Tendon Expo Muscle Expo Joint Expo Bone Expos After Cleansing: No rino No Exposed Structure sed: No Subcutaneous Tissue) Exposed: Yes sed: No sed: No sed: No ed: No in Area: 97.7% in  Volume: 99.5% zation: Small (1-33%) No g: No Treatment Notes Wound #1 (Right, Posterior Calf) 1. Cleanse With Wound Cleanser Soap and water 2. Periwound Care Moisturizing lotion 3. Primary Dressing Applied Hydrofera Blue 4. Secondary Dressing ABD Pad Kerramax/Xtrasorb 6. Support Layer Applied 4 layer compression wrap Notes netting. Electronic Signature(s) Signed: 03/28/2019 3:43:33 PM By: Mikeal Hawthorne EMT/HBOT Signed: 03/28/2019 5:18:03 PM By: Kela Millin Previous Signature: 03/25/2019 5:13:27 PM Version By: Kela Millin Entered By: Mikeal Hawthorne on 03/28/2019 08:54:45 -------------------------------------------------------------------------------- Wound Assessment Details Patient Name: Date of Service: [name], [name] 03/25/2019 1:15 PM Medical Record [id number] Patient Account Number: [id number] Date of Birth/Sex: Treating RN: 1955/08/22 (63 y.o. [name] Primary Care Calleigh Lafontant: [name] Other Clinician: Referring Allison Silva: Treating Tabb Croghan/Extender:[name], [name], [name] in Treatment: 34 Wound Status Wound Number: 11 Primary Diabetic Wound/Ulcer of the Lower Extremity Etiology: Wound Location: Left Knee Wound Open Wounding Event: Blister Status: Date Acquired: 02/25/2019 Comorbid Asthma, Sleep Apnea, Arrhythmia, Weeks Of Treatment: 3 History: Congestive Heart Failure, Hypertension, Type Clustered Wound: No II Diabetes Photos Wound Measurements Length: (cm) 0.2 % Reductio Width: (cm) 0.2 % Reductio Depth: (cm) 0.1 Epithelial Area: (cm) 0.031 Tunneling Volume: (cm) 0.003 Undermini Wound Description Classification: Grade 1 Wound Margin: Distinct, outline attached Exudate Amount: Small Exudate Type: Serosanguineous Exudate Color: red, brown Wound Bed Granulation Amount: Large (67-100%) Granulation Quality: Red Necrotic Amount: None Present (0%) Foul Odor After Cleansing: No Slough/Fibrino  No Exposed Structure Fascia Exposed: No Fat Layer (Subcutaneous Tissue) Exposed: Yes Tendon Exposed: No Muscle Exposed: No Joint Exposed: No Bone Exposed: No n in Area: 99.2% n in Volume: 99.2% ization: Large (67-100%) : No ng: No Treatment Notes Wound #11 (Left Knee) 1. Cleanse With Wound Cleanser 2. Periwound Care Skin Prep 3. Primary Dressing Applied Hydrofera Blue 4. Secondary Dressing Foam Border Dressing 5. Secured With Office manager) Signed: 03/28/2019 3:43:33 PM By: Mikeal Hawthorne EMT/HBOT Signed: 03/28/2019 5:18:03 PM By: Kela Millin Previous Signature: 03/25/2019 5:13:27 PM Version By: Kela Millin Entered By: Mikeal Hawthorne on 03/28/2019 08:55:43 -------------------------------------------------------------------------------- Wound Assessment Details Patient Name: Date of Service: [name], [name] 03/25/2019 1:15 PM Medical Record [id number] Patient Account Number: [id number] Date of Birth/Sex: Treating RN: June 10, 1956 (63 y.o. [name] Primary Care Caralynn Gelber: [name]  Other Clinician: Referring Adaleah Forget: Treating Hilman Kissling/Extender:[name], [name], [name] in Treatment: 34 Wound Status Wound Number: 6 Primary Venous Leg Ulcer Etiology: Wound Location: Right Lower Leg - Posterior, Proximal Wound Open Status: Wounding Event: Gradually Appeared Comorbid Asthma, Sleep Apnea, Arrhythmia, Date Acquired: 01/06/2019 History: Congestive Heart Failure, Hypertension, Type Weeks Of Treatment: 11 II Diabetes Clustered Wound: No Photos Wound Measurements Length: (cm) 1.5 % Reduct Width: (cm) 1.3 % Reduct Depth: (cm) 0.1 Epitheli Area: (cm) 1.532 Tunneli Volume: (cm) 0.153 Undermi Wound Description Full Thickness Without Exposed Support Foul Od Classification: Structures Slough/ Wound Flat and Intact Margin: Exudate Medium Amount: Exudate  Serosanguineous Type: Exudate red, brown Color: Wound Bed Granulation Amount: Large (67-100%) Granulation Quality: Red, Pink Fascia E Necrotic Amount: None Present (0%) Fat Laye Tendon E Muscle E Joint Ex Bone Expos or After Cleansing: No Fibrino No Exposed Structure xposed: No r (Subcutaneous Tissue) Exposed: Yes xposed: No xposed: No posed: No ed: No ion in Area: -289.8% ion in Volume: -292.3% alization: Large (67-100%) ng: No ning: No Treatment Notes Wound #6 (Right, Proximal, Posterior Lower Leg) 1. Cleanse With Wound Cleanser Soap and water 2. Periwound Care Moisturizing lotion 3. Primary Dressing Applied Hydrofera Blue 4. Secondary Dressing ABD Pad Kerramax/Xtrasorb 6. Support Layer Applied 4 layer compression wrap Notes netting. Electronic Signature(s) Signed: 03/28/2019 3:43:33 PM By: Mikeal Hawthorne EMT/HBOT Signed: 03/28/2019 5:18:03 PM By: Kela Millin Previous Signature: 03/25/2019 5:13:27 PM Version By: Kela Millin Entered By: Mikeal Hawthorne on 03/28/2019 08:53:46 -------------------------------------------------------------------------------- Wound Assessment Details Patient Name: Date of Service: [name], [name] 03/25/2019 1:15 PM Medical Record [id number] Patient Account Number: [id number] Date of Birth/Sex: Treating RN: May 18, 1956 (63 y.o. [name] Primary Care Giovanni Bath: [name] Other Clinician: Referring Suri Tafolla: Treating Page Pucciarelli/Extender:[name], [name], [name] in Treatment: 34 Wound Status Wound Number: 7 Primary Venous Leg Ulcer Etiology: Wound Location: Right Lower Leg - Anterior Wound Open Wounding Event: Trauma Status: Date Acquired: 01/13/2019 Comorbid Asthma, Sleep Apnea, Arrhythmia, Weeks Of Treatment: 10 History: Congestive Heart Failure, Hypertension, Type Clustered Wound: No II Diabetes Photos Wound Measurements Length: (cm) 2 % Reduct Width: (cm) 1.4 %  Reduct Depth: (cm) 0.2 Epitheli Area: (cm) 2.199 Tunneli Volume: (cm) 0.44 Undermi Wound Description Classification: Full Thickness Without Exposed Support Foul Od Structures Slough/ Wound Well defined, not attached Margin: Exudate Medium Amount: Exudate Serosanguineous Type: Exudate red, brown Color: Wound Bed Granulation Amount: Large (67-100%) Granulation Quality: Red, Pink Fascia E Necrotic Amount: Small (1-33%) Fat Laye Necrotic Quality: Adherent Slough Tendon E Muscle E Joint Ex Bone Exp or After Cleansing: No Fibrino Yes Exposed Structure xposed: No r (Subcutaneous Tissue) Exposed: Yes xposed: No xposed: No posed: No osed: No ion in Area: -1021.9% ion in Volume: -2100% alization: Small (1-33%) ng: No ning: No Treatment Notes Wound #7 (Right, Anterior Lower Leg) 1. Cleanse With Wound Cleanser Soap and water 2. Periwound Care Moisturizing lotion 3. Primary Dressing Applied Hydrofera Blue 4. Secondary Dressing ABD Pad Kerramax/Xtrasorb 6. Support Layer Applied 4 layer compression wrap Notes netting. Electronic Signature(s) Signed: 03/28/2019 3:43:33 PM By: Mikeal Hawthorne EMT/HBOT Signed: 03/28/2019 5:18:03 PM By: Kela Millin Previous Signature: 03/25/2019 5:13:27 PM Version By: Kela Millin Entered By: Mikeal Hawthorne on 03/28/2019 08:53:25 -------------------------------------------------------------------------------- Wound Assessment Details Patient Name: Date of Service: [name], [name] 03/25/2019 1:15 PM Medical Record [id number] Patient Account Number: [id number] Date of Birth/Sex: Treating RN: 1956/01/24 (63 y.o. [name] Primary Care Tamas Suen: [name] Other Clinician: Referring Trevone Prestwood: Treating Chellsie Gomer/Extender:[name], [name], [name] in  Treatment: 34 Wound Status Wound Number: 8 Primary Venous Leg Ulcer Etiology: Wound Location: Left Lower Leg - Anterior Wound  Open Wounding Event: Gradually Appeared Status: Date Acquired: 02/14/2019 Comorbid Asthma, Sleep Apnea, Arrhythmia, Weeks Of Treatment: 5 History: Congestive Heart Failure, Hypertension, Type Clustered Wound: No II Diabetes Photos Wound Measurements Length: (cm) 9.4 % Reduct Width: (cm) 2.7 % Reduct Depth: (cm) 0.1 Epitheli Area: (cm) 19.933 Tunneli Volume: (cm) 1.993 Undermi Wound Description Classification: Full Thickness Without Exposed Support Foul Od Structures Slough/ Wound Distinct, outline attached Margin: Exudate Medium Amount: Exudate Serosanguineous Type: Exudate red, brown Color: Wound Bed Granulation Amount: Medium (34-66%) Granulation Quality: Red Fascia Exp Necrotic Amount: Medium (34-66%) Fat Layer Necrotic Quality: Adherent Slough Tendon Exp Muscle Exp Joint Expo Bone Expos or After Cleansing: No Fibrino Yes Exposed Structure osed: No (Subcutaneous Tissue) Exposed: Yes osed: No osed: No sed: No ed: No ion in Area: -217.3% ion in Volume: -217.4% alization: Small (1-33%) ng: No ning: No Treatment Notes Wound #8 (Left, Anterior Lower Leg) 1. Cleanse With Wound Cleanser Soap and water 2. Periwound Care Moisturizing lotion 3. Primary Dressing Applied Hydrofera Blue 4. Secondary Dressing ABD Pad Kerramax/Xtrasorb 6. Support Layer Applied 4 layer compression wrap Notes netting. Electronic Signature(s) Signed: 03/28/2019 3:43:33 PM By: Mikeal Hawthorne EMT/HBOT Signed: 03/28/2019 5:18:03 PM By: Kela Millin Previous Signature: 03/25/2019 5:13:27 PM Version By: Kela Millin Entered By: Mikeal Hawthorne on 03/28/2019 08:56:24 -------------------------------------------------------------------------------- Vitals Details Patient Name: Date of Service: [name], [name] 03/25/2019 1:15 PM Medical Record [id number] Patient Account Number: [id number] Date of Birth/Sex: Treating RN: 1955-07-28 (63 y.o. [name] Primary Care Valdez Brannan: [name] Other Clinician: Referring Jeremy Ditullio: Treating Marck Mcclenny/Extender:[name], [name], [name] in Treatment: 34 Vital Signs Time Taken: 12:55 Temperature (F): 97.8 Height (in): 73 Pulse (bpm): 63 Weight (lbs): 290 Respiratory Rate (breaths/min): 16 Body Mass Index (BMI): 38.3 Blood Pressure (mmHg): 153/86 Reference Range: 80 - 120 mg / dl Electronic Signature(s) Signed: 03/25/2019 5:13:27 PM By: Kela Millin Entered By: Kela Millin on 03/25/2019 13:07:12

## 2019-03-29 NOTE — Progress Notes (Signed)
[name], [name] ([name]) Visit Report for 03/25/2019 HPI Details Patient Name: Date of Service: [name], [name] 03/25/2019 1:15 PM Medical Record [name] Patient Account Number: [id number] Date of Birth/Sex: Treating RN: 27-Feb-1956 (63 y.o. [name] Primary Care Provider: [name] Other Clinician: Referring Provider: Treating Provider/Extender:Dravin Lance, [name], [name] in Treatment: 34 History of Present Illness HPI Description: ADMISSION 07/29/2018 [name] is a 63 year old man with either prediabetes or diabetes he is on glipizide. In late October to November 2019 he noted a scabbed area on the back of his right calf. He picked this off a few times but it would not heal. He saw his primary physician on 12/5. It was felt at that time that this may actually heal on its own with conservative management. The next visit was on 07/20/2018 noted the area was a lot worse and arranged for his treatment here. He has been topical antibiotics like Neosporin although he stopped using this when the wound looked worse. He is just been covering this with a clean Band-Aid. He is given Bactrim a week ago and he is finishing this currently. It is made some improvement in the surrounding erythema per the patient. He does not have a history of nonhealing wounds. No prior history of wounds on his legs that he had difficulty healing. No prior skin issues. 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 Past medical history; A. fib status post ablation and recent cardioversion he is on Xarelto. He has a history of systolic heart failure, cervical radiculopathy, cardiomyopathy and type 2 diabetes as discussed ABI in the right leg was noncompressible on the right 2/20; the biopsy I did on the patient last week was negative for malignancy. Culture grew Pseudomonas and methicillin sensitive staph aureus. He is on cefdinir 300 twice a day. I will have to  make this a 10 day course. He put him in compression. He states that the redness pain and erythema are a lot better. I suspect the patient has chronic venous insufficiency probably with a secondary cellulitis 2/27; arrives today with copious amounts of drainage coming out of the wound irritating the skin below the wound area. He only renewed the final 3 days of cefdinir today 3/5; came in for a nurse change 3 days ago. Again a lot of drainage noted. Zinc oxide was applied unfortunately the drainage appears to a pool then he has a string of superficial open areas extending down into the Achilles area and some just below the wound. The wound itself does not look too bad. He is completed the antibiotics although apparently there was separation from the original 7 with the last 3 days. Culture grew a few MSSA and a few Pseudomonas 3/12; not too much difference over the last week. He came in for a dressing change on Monday by our nursing staff. Necrotic debris again over the wound surface. The entire area looks irritated but nontender and I do not think shows obvious evidence of infection. We have not heard anything about the reflux studies. Also notable than in this diabetic man we had noncompressible vessels and although I can feel his pulses easily in his feet I will order arterial studies as well. 3/19-Patient had experience more pain has been dressed with a silver alginate, the wounds all have necrotic debris, very friable with easy bleeding with any kind of surface debridement including with gauze and Anasept and with a #3 curette. His vascular appointments unfortunately were all canceled on  account of the virus outbreak resulting in studies only being done for emergent cases. His pulses are easily palpable in the lower extremity. He has open areas below the primary wound where he states the drainage which included purulent material also with some blood pooled and caused breakdown. 3/26; the  patient was changed to Santyl with [facility] backing last week. This was largely because the silver alginate was sticking to the wound. He is still having quite a bit of pain. He tells [place] that the reflux studies and arterial studies we had attempted to arrange through vein and vascular are not being booked until mid May. Culture that was done last week showed both Serratia moderate and a few methicillin sensitive staph aureus I started him on Bactrim DS 1 p.o. twice daily on 3/23 for 7 days. He is here for follow-up. 4/3; patient is on Santyl with Hydrofera Blue. Patient complains of pain. Our intake nurse is noted drainage and some odor. He has completed Bactrim recently for Serratia and a few methicillin sensitive staph aureus. After our staff push hard last week we were able to get venous studies as well as arterial studies. His arterial studies showed on the right great toe pressure of 0.86. On the left ABI at 1.47 TBI of 0.87. On both sides waveforms were triphasic VENOUS STUDIES showed reflux in the femoral vein, popliteal vein great saphenous vein at the saphenofemoral junction and great saphenous vein at the proximal thigh. Also noted to have age-indeterminate superficial vein thrombosis involving the small saphenous vein. Notable that the patient is already on Xarelto for atrial fibrillation. 4/9; the patient has been to see vascular surgery and reviewed by Dr. [name]. He was felt to have only minimal superficial venous reflux in the right great greater saphenous vein. For this reason he was not felt to be a candidate for laser ablation of the right greater saphenous vein. A wedge cushion was suggested to keep his leg elevated. He does have deep venous reflux on the right. Culture I did last time showed a combination of Serratia Pseudomonas and methicillin sensitive staph aureus. We have been using Hydrofera Blue to the large wound, Santyl to some of the deep punched out satellite  lesions distal to it. There is some improvement [id number]; the application for Apligraf was put out for further review for material that we have resubmitted. He has the deep area on the right posterior calf which is large and then 3 small punched out areas beneath this. In general his wounds look a lot better 4/23; the patient has his large original wound and then 2 punched out satellite lesions below it on the right posterior calf. I was usually able to use Apligraf #1 to cover the full surface area of the larger wound. We continued with Santyl and Hydrofera Blue on the 2 punched-out areas 5/7; patient has his large original wound and 2 punched out satellite lesions below it in the right posterior calf. Apligraf #2 today. Major improvement in the big wound in terms of wound depth. Punched-out Warren wounds have a very healthy looking wound bed. 5/21-Patient comes back for his large right posterior calf wound for which she has been an Apligraf #3 today. Wound depth seems to be better, the punched-out wounds appear to have healthy bed with some bleeding 6/4; right posterior calf wound is much better. Apligraf #4 today. The major wound has no wound depth at all. Only 1 of the satellite lesions is still open. The patient  has very high blood pressure coming in today with a diastolic blood pressure of 130 after lying there for a few minutes it was down to 110. He states that is running [id number] diastolic at home. He was high last week as well I thought he was on 50 mg 1/2 tablet twice daily of losartan I told him to double up on that however it turns out he is actually on 50 twice daily. Course he is run out of his medications prematurely. He has a follow-up with his primary's office next Wednesday. 6/18; patient's wounds continue to improve. The small area laterally is just about closed and there is continued epithelialization on the area on the posterior calf. 7/2; we applied his last Apligraf last  visit. Early the next week there was a lot of purulent looking drainage that I cultured this grew staph and Pseudomonas however when we had him back for the next visit to 3 days later everything looked a lot better. He did not receive systemic antibiotics. Fortunately we did not have to remove the Apligraf. He has had heart trouble this week he was having an ablation apparently they have discovered a clot in his left atrium they have changed all his anticoagulants as well as his blood pressure medication. 7/16; using Hydrofera Blue. We are making nice progress towards closure. He has not yet ordered his compression stockings he has his measurements 7/23; dimensions are better. He has a new satellite area superiorly. Both wounds cauterized with silver nitrate. 7/30; absolutely no change in the major wound on the posterior part of the right calf. We have been using Hydrofera Blue for a prolonged period of time and really had a nice improvement but over the last few weeks this is stalled. There is no depth. He arrived in clinic today with a new area on the right anterior tibia which apparently was an "ingrown hair". It is clearly an open area. This looks like a wrap injury although he was not really aware of it. 8/31; since the patient was last in clinic he was admitted to hospital from 8/17 through 8/22. He had had multiple falls at home including one off a ladder. He was admitted to hospital with mild COVID-19 viral pneumonia. Noted to be hyperkalemic and in acute renal failure. He has chronic systolic heart failure with ejection fraction of 35%. He arrives back in clinic with the original wound on the posterior aspect of the right lower calf looking a lot better. He has eschared areas from falls and lacerations on his anterior knees bilaterally just below the patella and on the mid part of his tibia bilaterally. He seems to have a small area on the right lateral ankle as well 9/10- Patient comes  to clinic with a 2 new wounds one on the left anterior shin and one on the right posterior leg both the left knee and right knee areas are healed up. We have been using Santyl to the left anterior leg and silver alginate to right posterior leg wounds. 9/17; the patient is original wound looks good on the posterior right calf. He has traumatic areas on the left anterior shin left anterior patellar tendon and the right mid tibia area. Except for the right posterior calf all required debridement. We have been using Santyl on these areas and silver alginate posteriorly right calf 9/24; the original wound on the posterior calf on the right looks good. This is healthy. There are traumatic wounds in the left anterior shin,  left anterior patella in the right mid tibia area are about the same. He has dusky erythema on the left anterior tibia which led me to give him antibiotics last week. This does not look too much different. This is nontender and I wonder if this is all just venous inflammation. 10/2; the patient has 2 open areas on the right posterior calf both of these look good we have been using silver alginate. He has traumatic wounds on the left and right mid tibia areas. The surface of these wounds is still not ready for closure. We have been using Iodoflex. Finally has areas on the left knee. The latter wounds are all traumatic after a fall 10/9. His original wounds of the right posterior calf are superficial and progressing towards closure. His traumatic wounds on the left anterior tibia and right anterior tibia are considerably improved in terms of the wound surface. He asked me about a skin graft in these areas I do not think this is going to be necessary. Change from Iodosorb to [place]. The area on the left knee is just about closed as well Electronic Signature(s) Signed: 03/27/2019 10:27:00 AM By: Linton Ham MD Entered By: Linton Ham on 03/25/2019  14:45:14 -------------------------------------------------------------------------------- Physical Exam Details Patient Name: Date of Service: [name] 03/25/2019 1:15 PM Medical Record [id number] Patient Account Number: [id number] Date of Birth/Sex: Treating RN: 09/22/1955 (63 y.o. [name] Primary Care Provider: [name] Other Clinician: Referring Provider: Treating Provider/Extender:Zayvier Caravello, [name], [name] in Treatment: 73 Constitutional Patient is hypertensive.. Pulse regular and within target range for patient.[name] Respirations regular, non-labored and within target range.. Temperature is normal and within the target range for the patient.[name] Appears in no distress. Eyes Conjunctivae have some redness. Mild discharge noted.[name] Respiratory work of breathing is normal. Cardiovascular Edema is well controlled. Integumentary (Hair, Skin) Changes of chronic venous insufficiency. Psychiatric appears at normal baseline. Notes Wound exam Left anterior shin much better looking surface. Debrided with Anasept and gauze no mechanical debridement Right anterior tibia a little more depth to this but still looking better. Debrided with Anasept and gauze no mechanical debridement His original wounds on the right posterior calf also look like they are progressing towards closure The area on the left knee looks considerably better and looks as though it is progressing towards closure Electronic Signature(s) Signed: 03/27/2019 10:27:00 AM By: Linton Ham MD Entered By: Linton Ham on 03/25/2019 14:47:38 -------------------------------------------------------------------------------- Physician Orders Details Patient Name: Date of Service: [name] 03/25/2019 1:15 PM Medical Record [id number] Patient Account Number: [id number] Date of Birth/Sex: Treating RN: Mar 04, 1956 (63 y.o. [name] Primary Care Provider:  [name] Other Clinician: Referring Provider: Treating Provider/Extender:Ollie Esty, [name], [name] in Treatment: 34 Verbal / Phone Orders: No Diagnosis Coding ICD-10 Coding Code Description [phone] Non-pressure chronic ulcer of right calf with fat layer exposed E11.622 Type 2 diabetes mellitus with other skin ulcer I87.321 Chronic venous hypertension (idiopathic) with inflammation of right lower extremity S81.811D Laceration without foreign body, right lower leg, subsequent encounter S81.812D Laceration without foreign body, left lower leg, subsequent encounter L03.115 Cellulitis of right lower limb Follow-up Appointments Return Appointment in 1 week. Dressing Change Frequency Do not change entire dressing for one week. - both lower legs. Wound #11 Left Knee Change Dressing every other day. Skin Barriers/Peri-Wound Care Moisturizing lotion - both legs Wound Cleansing May shower with protection. Primary Wound Dressing Wound #1 Right,Posterior Calf Hydrofera Blue Wound #11 Left Knee Hydrofera Blue Wound #6 Right,Proximal,Posterior  Lower Leg Hydrofera Blue Wound #7 Right,Anterior Lower Leg Hydrofera Blue Wound #8 Left,Anterior Lower Leg Hydrofera Blue Secondary Dressing Wound #1 Right,Posterior Calf ABD pad Kerramax - or zetvuit Wound #11 Left Knee Foam Border Wound #6 Right,Proximal,Posterior Lower Leg ABD pad Kerramax - or zetvuit Wound #7 Right,Anterior Lower Leg ABD pad Kerramax - or zetvuit Wound #8 Left,Anterior Lower Leg ABD pad Kerramax - or zetvuit Edema Control 4 layer compression - Bilateral Avoid standing for long periods of time Elevate legs to the level of the heart or above for 30 minutes daily and/or when sitting, a frequency of: - 3-4 times a day. Electronic Signature(s) Signed: 03/27/2019 10:27:00 AM By: Linton Ham MD Signed: 03/29/2019 5:52:09 PM By: Levan Hurst RN, BSN Entered By: Levan Hurst on  03/25/2019 14:10:31 -------------------------------------------------------------------------------- Problem List Details Patient Name: Date of Service: ALEXANDE, WILLEVER 03/25/2019 1:15 PM Medical Record [id number] Patient Account Number: [id number] Date of Birth/Sex: Treating RN: 24-Dec-1955 (63 y.o. [name] Primary Care Provider: [name] Other Clinician: Referring Provider: Treating Provider/Extender:Nour Rodrigues, [name], [name] in Treatment: 34 Active Problems ICD-10 Evaluated Encounter Code Description Active Date Today Diagnosis L97.212 Non-pressure chronic ulcer of right calf with fat layer 07/29/2018 No Yes exposed E11.622 Type 2 diabetes mellitus with other skin ulcer 07/29/2018 No Yes I87.321 Chronic venous hypertension (idiopathic) with 07/29/2018 No Yes inflammation of right lower extremity S81.811D Laceration without foreign body, right lower leg, 02/14/2019 No Yes subsequent encounter S81.812D Laceration without foreign body, left lower leg, 02/14/2019 No Yes subsequent encounter L03.115 Cellulitis of right lower limb 09/24/2018 No Yes Inactive Problems Resolved Problems Electronic Signature(s) Signed: 03/27/2019 10:27:00 AM By: Linton Ham MD Entered By: Linton Ham on 03/25/2019 14:35:16 -------------------------------------------------------------------------------- Progress Note Details Patient Name: Date of Service: [name] 03/25/2019 1:15 PM Medical Record [id number] Patient Account Number: [id number] Date of Birth/Sex: Treating RN: 1956/06/01 (63 y.o. [name] Primary Care Provider: [name] Other Clinician: Referring Provider: Treating Provider/Extender:Marguerette Sheller, [name], [name] in Treatment: 34 Subjective History of Present Illness (HPI) ADMISSION 07/29/2018 Mr. Aderholt is a 63 year old man with either prediabetes or diabetes he is on glipizide. In late  October to November 2019 he noted a scabbed area on the back of his right calf. He picked this off a few times but it would not heal. He saw his primary physician on 12/5. It was felt at that time that this may actually heal on its own with conservative management. The next visit was on 07/20/2018 noted the area was a lot worse and arranged for his treatment here. He has been topical antibiotics like Neosporin although he stopped using this when the wound looked worse. He is just been covering this with a clean Band-Aid. He is given Bactrim a week ago and he is finishing this currently. It is made some improvement in the surrounding erythema per the patient. He does not have a history of nonhealing wounds. No prior history of wounds on his legs that he had difficulty healing. No prior skin issues. 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 Past medical history; A. fib status post ablation and recent cardioversion he is on Xarelto. He has a history of systolic heart failure, cervical radiculopathy, cardiomyopathy and type 2 diabetes as discussed ABI in the right leg was noncompressible on the right 2/20; the biopsy I did on the patient last week was negative for malignancy. Culture grew Pseudomonas and methicillin sensitive staph aureus. He is on cefdinir 300 twice  a day. I will have to make this a 10 day course. He put him in compression. He states that the redness pain and erythema are a lot better. I suspect the patient has chronic venous insufficiency probably with a secondary cellulitis 2/27; arrives today with copious amounts of drainage coming out of the wound irritating the skin below the wound area. He only renewed the final 3 days of cefdinir today 3/5; came in for a nurse change 3 days ago. Again a lot of drainage noted. Zinc oxide was applied unfortunately the drainage appears to a pool then he has a string of superficial open areas extending down into the Achilles area  and some just below the wound. The wound itself does not look too bad. He is completed the antibiotics although apparently there was separation from the original 7 with the last 3 days. Culture grew a few MSSA and a few Pseudomonas 3/12; not too much difference over the last week. He came in for a dressing change on Monday by our nursing staff. Necrotic debris again over the wound surface. The entire area looks irritated but nontender and I do not think shows obvious evidence of infection. We have not heard anything about the reflux studies. Also notable than in this diabetic man we had noncompressible vessels and although I can feel his pulses easily in his feet I will order arterial studies as well. 3/19-Patient had experience more pain has been dressed with a silver alginate, the wounds all have necrotic debris, very friable with easy bleeding with any kind of surface debridement including with gauze and Anasept and with a #3 curette. His vascular appointments unfortunately were all canceled on account of the virus outbreak resulting in studies only being done for emergent cases. His pulses are easily palpable in the lower extremity. He has open areas below the primary wound where he states the drainage which included purulent material also with some blood pooled and caused breakdown. 3/26; the patient was changed to Santyl with [facility] backing last week. This was largely because the silver alginate was sticking to the wound. He is still having quite a bit of pain. He tells [place] that the reflux studies and arterial studies we had attempted to arrange through vein and vascular are not being booked until mid May. Culture that was done last week showed both Serratia moderate and a few methicillin sensitive staph aureus I started him on Bactrim DS 1 p.o. twice daily on 3/23 for 7 days. He is here for follow-up. 4/3; patient is on Santyl with Hydrofera Blue. Patient complains of pain. Our  intake nurse is noted drainage and some odor. He has completed Bactrim recently for Serratia and a few methicillin sensitive staph aureus. After our staff push hard last week we were able to get venous studies as well as arterial studies. His arterial studies showed on the right great toe pressure of 0.86. On the left ABI at 1.47 TBI of 0.87. On both sides waveforms were triphasic VENOUS STUDIES showed reflux in the femoral vein, popliteal vein great saphenous vein at the saphenofemoral junction and great saphenous vein at the proximal thigh. Also noted to have age-indeterminate superficial vein thrombosis involving the small saphenous vein. Notable that the patient is already on Xarelto for atrial fibrillation. 4/9; the patient has been to see vascular surgery and reviewed by Dr. [name]. He was felt to have only minimal superficial venous reflux in the right great greater saphenous vein. For this reason he was  not felt to be a candidate for laser ablation of the right greater saphenous vein. A wedge cushion was suggested to keep his leg elevated. He does have deep venous reflux on the right. Culture I did last time showed a combination of Serratia Pseudomonas and methicillin sensitive staph aureus. We have been using Hydrofera Blue to the large wound, Santyl to some of the deep punched out satellite lesions distal to it. There is some improvement [id number]; the application for Apligraf was put out for further review for material that we have resubmitted. He has the deep area on the right posterior calf which is large and then 3 small punched out areas beneath this. In general his wounds look a lot better 4/23; the patient has his large original wound and then 2 punched out satellite lesions below it on the right posterior calf. I was usually able to use Apligraf #1 to cover the full surface area of the larger wound. We continued with Santyl and Hydrofera Blue on the 2 punched-out areas 5/7; patient  has his large original wound and 2 punched out satellite lesions below it in the right posterior calf. Apligraf #2 today. Major improvement in the big wound in terms of wound depth. Punched-out Warren wounds have a very healthy looking wound bed. 5/21-Patient comes back for his large right posterior calf wound for which she has been an Apligraf #3 today. Wound depth seems to be better, the punched-out wounds appear to have healthy bed with some bleeding 6/4; right posterior calf wound is much better. Apligraf #4 today. The major wound has no wound depth at all. Only 1 of the satellite lesions is still open. The patient has very high blood pressure coming in today with a diastolic blood pressure of 130 after lying there for a few minutes it was down to 110. He states that is running [id number] diastolic at home. He was high last week as well I thought he was on 50 mg 1/2 tablet twice daily of losartan I told him to double up on that however it turns out he is actually on 50 twice daily. Course he is run out of his medications prematurely. He has a follow-up with his primary's office next Wednesday. 6/18; patient's wounds continue to improve. The small area laterally is just about closed and there is continued epithelialization on the area on the posterior calf. 7/2; we applied his last Apligraf last visit. Early the next week there was a lot of purulent looking drainage that I cultured this grew staph and Pseudomonas however when we had him back for the next visit to 3 days later everything looked a lot better. He did not receive systemic antibiotics. Fortunately we did not have to remove the Apligraf. He has had heart trouble this week he was having an ablation apparently they have discovered a clot in his left atrium they have changed all his anticoagulants as well as his blood pressure medication. 7/16; using Hydrofera Blue. We are making nice progress towards closure. He has not yet ordered his  compression stockings he has his measurements 7/23; dimensions are better. He has a new satellite area superiorly. Both wounds cauterized with silver nitrate. 7/30; absolutely no change in the major wound on the posterior part of the right calf. We have been using Hydrofera Blue for a prolonged period of time and really had a nice improvement but over the last few weeks this is stalled. There is no depth. He arrived in clinic today with  a new area on the right anterior tibia which apparently was an "ingrown hair". It is clearly an open area. This looks like a wrap injury although he was not really aware of it. 8/31; since the patient was last in clinic he was admitted to hospital from 8/17 through 8/22. He had had multiple falls at home including one off a ladder. He was admitted to hospital with mild COVID-19 viral pneumonia. Noted to be hyperkalemic and in acute renal failure. He has chronic systolic heart failure with ejection fraction of 35%. He arrives back in clinic with the original wound on the posterior aspect of the right lower calf looking a lot better. He has eschared areas from falls and lacerations on his anterior knees bilaterally just below the patella and on the mid part of his tibia bilaterally. He seems to have a small area on the right lateral ankle as well 9/10- Patient comes to clinic with a 2 new wounds one on the left anterior shin and one on the right posterior leg both the left knee and right knee areas are healed up. We have been using Santyl to the left anterior leg and silver alginate to right posterior leg wounds. 9/17; the patient is original wound looks good on the posterior right calf. He has traumatic areas on the left anterior shin left anterior patellar tendon and the right mid tibia area. Except for the right posterior calf all required debridement. We have been using Santyl on these areas and silver alginate posteriorly right calf 9/24; the original wound on  the posterior calf on the right looks good. This is healthy. There are traumatic wounds in the left anterior shin, left anterior patella in the right mid tibia area are about the same. He has dusky erythema on the left anterior tibia which led me to give him antibiotics last week. This does not look too much different. This is nontender and I wonder if this is all just venous inflammation. 10/2; the patient has 2 open areas on the right posterior calf both of these look good we have been using silver alginate. He has traumatic wounds on the left and right mid tibia areas. The surface of these wounds is still not ready for closure. We have been using Iodoflex. Finally has areas on the left knee. The latter wounds are all traumatic after a fall 10/9. His original wounds of the right posterior calf are superficial and progressing towards closure. His traumatic wounds on the left anterior tibia and right anterior tibia are considerably improved in terms of the wound surface. He asked me about a skin graft in these areas I do not think this is going to be necessary. Change from Iodosorb to [facility]. ooThe area on the left knee is just about closed as well Objective Constitutional Patient is hypertensive.. Pulse regular and within target range for patient.[name] Respirations regular, non-labored and within target range.. Temperature is normal and within the target range for the patient.[name] Appears in no distress. Vitals Time Taken: 12:55 PM, Height: 73 in, Weight: 290 lbs, BMI: 38.3, Temperature: 97.8 F, Pulse: 63 bpm, Respiratory Rate: 16 breaths/min, Blood Pressure: 153/86 mmHg. Eyes Conjunctivae have some redness. Mild discharge noted.[name] Respiratory work of breathing is normal. Cardiovascular Edema is well controlled. Psychiatric appears at normal baseline. General Notes: Wound exam ooLeft anterior shin much better looking surface. Debrided with Anasept and gauze no mechanical debridement  ooRight anterior tibia a little more depth to this but still looking better. Debrided  with Anasept and gauze no mechanical debridement ooHis original wounds on the right posterior calf also look like they are progressing towards closure ooThe area on the left knee looks considerably better and looks as though it is progressing towards closure Integumentary (Hair, Skin) Changes of chronic venous insufficiency. Wound #1 status is Open. Original cause of wound was Gradually Appeared. The wound is located on the Right,Posterior Calf. The wound measures 0.6cm length x 0.5cm width x 0.1cm depth; 0.236cm^2 area and 0.024cm^3 volume. There is Fat Layer (Subcutaneous Tissue) Exposed exposed. There is no tunneling or undermining noted. There is a small amount of serosanguineous drainage noted. The wound margin is flat and intact. There is large (67-100%) red, pink granulation within the wound bed. There is no necrotic tissue within the wound bed. Wound #11 status is Open. Original cause of wound was Blister. The wound is located on the Left Knee. The wound measures 0.2cm length x 0.2cm width x 0.1cm depth; 0.031cm^2 area and 0.003cm^3 volume. There is Fat Layer (Subcutaneous Tissue) Exposed exposed. There is no tunneling or undermining noted. There is a small amount of serosanguineous drainage noted. The wound margin is distinct with the outline attached to the wound base. There is large (67-100%) red granulation within the wound bed. There is no necrotic tissue within the wound bed. Wound #6 status is Open. Original cause of wound was Gradually Appeared. The wound is located on the Right,Proximal,Posterior Lower Leg. The wound measures 1.5cm length x 1.3cm width x 0.1cm depth; 1.532cm^2 area and 0.153cm^3 volume. There is Fat Layer (Subcutaneous Tissue) Exposed exposed. There is no tunneling or undermining noted. There is a medium amount of serosanguineous drainage noted. The wound margin is flat  and intact. There is large (67-100%) red, pink granulation within the wound bed. There is no necrotic tissue within the wound bed. Wound #7 status is Open. Original cause of wound was Trauma. The wound is located on the Right,Anterior Lower Leg. The wound measures 2cm length x 1.4cm width x 0.2cm depth; 2.199cm^2 area and 0.44cm^3 volume. There is Fat Layer (Subcutaneous Tissue) Exposed exposed. There is no tunneling or undermining noted. There is a medium amount of serosanguineous drainage noted. The wound margin is well defined and not attached to the wound base. There is large (67-100%) red, pink granulation within the wound bed. There is a small (1-33%) amount of necrotic tissue within the wound bed including Adherent Slough. Wound #8 status is Open. Original cause of wound was Gradually Appeared. The wound is located on the Left,Anterior Lower Leg. The wound measures 9.4cm length x 2.7cm width x 0.1cm depth; 19.933cm^2 area and 1.993cm^3 volume. There is Fat Layer (Subcutaneous Tissue) Exposed exposed. There is no tunneling or undermining noted. There is a medium amount of serosanguineous drainage noted. The wound margin is distinct with the outline attached to the wound base. There is medium (34-66%) red granulation within the wound bed. There is a medium (34-66%) amount of necrotic tissue within the wound bed including Adherent Slough. Assessment Active Problems ICD-10 Non-pressure chronic ulcer of right calf with fat layer exposed Type 2 diabetes mellitus with other skin ulcer Chronic venous hypertension (idiopathic) with inflammation of right lower extremity Laceration without foreign body, right lower leg, subsequent encounter Laceration without foreign body, left lower leg, subsequent encounter Cellulitis of right lower limb Procedures Wound #1 Pre-procedure diagnosis of Wound #1 is a Venous Leg Ulcer located on the Right,Posterior Calf . There was a Four Layer Compression  Therapy Procedure by  Levan Hurst, RN. Post procedure Diagnosis Wound #1: Same as Pre-Procedure Wound #6 Pre-procedure diagnosis of Wound #6 is a Venous Leg Ulcer located on the Right,Proximal,Posterior Lower Leg . There was a Four Layer Compression Therapy Procedure by Levan Hurst, RN. Post procedure Diagnosis Wound #6: Same as Pre-Procedure Wound #7 Pre-procedure diagnosis of Wound #7 is a Venous Leg Ulcer located on the Right,Anterior Lower Leg . There was a Four Layer Compression Therapy Procedure by Levan Hurst, RN. Post procedure Diagnosis Wound #7: Same as Pre-Procedure Wound #8 Pre-procedure diagnosis of Wound #8 is a Venous Leg Ulcer located on the Left,Anterior Lower Leg . There was a Four Layer Compression Therapy Procedure by Levan Hurst, RN. Post procedure Diagnosis Wound #8: Same as Pre-Procedure Plan Follow-up Appointments: Return Appointment in 1 week. Dressing Change Frequency: Do not change entire dressing for one week. - both lower legs. Wound #11 Left Knee: Change Dressing every other day. Skin Barriers/Peri-Wound Care: Moisturizing lotion - both legs Wound Cleansing: May shower with protection. Primary Wound Dressing: Wound #1 Right,Posterior Calf: Hydrofera Blue Wound #11 Left Knee: Hydrofera Blue Wound #6 Right,Proximal,Posterior Lower Leg: Hydrofera Blue Wound #7 Right,Anterior Lower Leg: Hydrofera Blue Wound #8 Left,Anterior Lower Leg: Hydrofera Blue Secondary Dressing: Wound #1 Right,Posterior Calf: ABD pad Kerramax - or zetvuit Wound #11 Left Knee: Foam Border Wound #6 Right,Proximal,Posterior Lower Leg: ABD pad Kerramax - or zetvuit Wound #7 Right,Anterior Lower Leg: ABD pad Kerramax - or zetvuit Wound #8 Left,Anterior Lower Leg: ABD pad Kerramax - or zetvuit Edema Control: 4 layer compression - Bilateral Avoid standing for long periods of time Elevate legs to the level of the heart or above for 30 minutes daily and/or when  sitting, a frequency of: - 3-4 times a day. 1. I change the dressings to Hydrofera Blue to all wounds 2. I am expecting the areas on the right posterior calf and left knee to be healed soon 3. Consider Apligraf to the 2 wounds on the left anterior tibia right anterior tibia. These are large deep wounds in the setting of chronic venous insufficiency/stasis dermatitis Electronic Signature(s) Signed: 03/27/2019 10:27:00 AM By: Linton Ham MD Entered By: Linton Ham on 03/25/2019 14:48:51 -------------------------------------------------------------------------------- SuperBill Details Patient Name: Date of Service: [name] 03/25/2019 Medical Record [id number] Patient Account Number: [id number] Date of Birth/Sex: Treating RN: 05-Jan-1956 (63 y.o. [name] Primary Care Provider: [name] Other Clinician: Referring Provider: Treating Provider/Extender:Aleesia Henney, [name], [name] in Treatment: 34 Diagnosis Coding ICD-10 Codes Code Description [phone] Non-pressure chronic ulcer of right calf with fat layer exposed E11.622 Type 2 diabetes mellitus with other skin ulcer I87.321 Chronic venous hypertension (idiopathic) with inflammation of right lower extremity S81.811D Laceration without foreign body, right lower leg, subsequent encounter S81.812D Laceration without foreign body, left lower leg, subsequent encounter L03.115 Cellulitis of right lower limb Facility Procedures CPT4: Code [id number] Description: [id number] BILATERAL: Application of multi-layer venous compression system; leg (below knee), including ankle and foot. Modifier Quantity: 1 Physician Procedures CPT4 Code Description: [id number] - WC PHYS LEVEL 3 - EST PT ICD-10 Diagnosis Description L97.212 Non-pressure chronic ulcer of right calf with fat layer S81.812D Laceration without foreign body, left lower leg, subseq I87.321 Chronic venous  hypertension (idiopathic) with  inflammat extremity Modifier: exposed uent encounter ion of right low Quantity: 1 er Engineer, maintenance) Signed: 03/27/2019 10:27:00 AM By: Linton Ham MD Signed: 03/29/2019 5:52:09 PM By: Levan Hurst RN, BSN Entered By: Levan Hurst on 03/25/2019 17:12:13

## 2019-03-30 ENCOUNTER — Other Ambulatory Visit: Payer: Self-pay | Admitting: Cardiology

## 2019-03-30 ENCOUNTER — Encounter: Payer: Self-pay | Admitting: Cardiology

## 2019-03-30 DIAGNOSIS — I10 Essential (primary) hypertension: Secondary | ICD-10-CM

## 2019-03-30 DIAGNOSIS — I420 Dilated cardiomyopathy: Secondary | ICD-10-CM | POA: Insufficient documentation

## 2019-03-30 DIAGNOSIS — I428 Other cardiomyopathies: Secondary | ICD-10-CM | POA: Insufficient documentation

## 2019-03-30 DIAGNOSIS — I5022 Chronic systolic (congestive) heart failure: Secondary | ICD-10-CM

## 2019-03-30 NOTE — Telephone Encounter (Signed)
Looks like medication was just refilled a few days ago. Tried to call patient to verify. No answer. Left message on voicemail to call back.

## 2019-03-31 ENCOUNTER — Other Ambulatory Visit: Payer: Self-pay

## 2019-03-31 DIAGNOSIS — I10 Essential (primary) hypertension: Secondary | ICD-10-CM

## 2019-03-31 DIAGNOSIS — I5042 Chronic combined systolic (congestive) and diastolic (congestive) heart failure: Secondary | ICD-10-CM

## 2019-03-31 NOTE — Telephone Encounter (Signed)
*  STAT* If patient is at the pharmacy, call can be transferred to refill team.   1. Which medications need to be refilled? (please list name of each medication and dose if known) ISOSORBIDE  2. Which pharmacy/location (including street and city if local pharmacy) is medication to be sent to? [facility]   3. Do they need a 30 day or 90 day supply? [place]

## 2019-04-01 ENCOUNTER — Encounter (HOSPITAL_BASED_OUTPATIENT_CLINIC_OR_DEPARTMENT_OTHER): Payer: BLUE CROSS/BLUE SHIELD | Attending: Internal Medicine | Admitting: Internal Medicine

## 2019-04-01 ENCOUNTER — Other Ambulatory Visit: Payer: Self-pay

## 2019-04-01 DIAGNOSIS — L97212 Non-pressure chronic ulcer of right calf with fat layer exposed: Secondary | ICD-10-CM | POA: Diagnosis not present

## 2019-04-01 DIAGNOSIS — L97822 Non-pressure chronic ulcer of other part of left lower leg with fat layer exposed: Secondary | ICD-10-CM | POA: Diagnosis not present

## 2019-04-01 DIAGNOSIS — G4730 Sleep apnea, unspecified: Secondary | ICD-10-CM | POA: Insufficient documentation

## 2019-04-01 DIAGNOSIS — S81812A Laceration without foreign body, left lower leg, initial encounter: Secondary | ICD-10-CM | POA: Diagnosis not present

## 2019-04-01 DIAGNOSIS — I110 Hypertensive heart disease with heart failure: Secondary | ICD-10-CM | POA: Insufficient documentation

## 2019-04-01 DIAGNOSIS — L97812 Non-pressure chronic ulcer of other part of right lower leg with fat layer exposed: Secondary | ICD-10-CM | POA: Insufficient documentation

## 2019-04-01 DIAGNOSIS — I87313 Chronic venous hypertension (idiopathic) with ulcer of bilateral lower extremity: Secondary | ICD-10-CM | POA: Diagnosis not present

## 2019-04-01 DIAGNOSIS — L97829 Non-pressure chronic ulcer of other part of left lower leg with unspecified severity: Secondary | ICD-10-CM | POA: Diagnosis not present

## 2019-04-01 DIAGNOSIS — E11622 Type 2 diabetes mellitus with other skin ulcer: Secondary | ICD-10-CM | POA: Insufficient documentation

## 2019-04-01 DIAGNOSIS — I509 Heart failure, unspecified: Secondary | ICD-10-CM | POA: Diagnosis not present

## 2019-04-01 DIAGNOSIS — S81811A Laceration without foreign body, right lower leg, initial encounter: Secondary | ICD-10-CM | POA: Diagnosis not present

## 2019-04-01 DIAGNOSIS — I87331 Chronic venous hypertension (idiopathic) with ulcer and inflammation of right lower extremity: Secondary | ICD-10-CM | POA: Insufficient documentation

## 2019-04-01 DIAGNOSIS — X58XXXA Exposure to other specified factors, initial encounter: Secondary | ICD-10-CM | POA: Insufficient documentation

## 2019-04-01 NOTE — Progress Notes (Signed)
[name], [name] ([name]) Visit Report for 04/01/2019 HPI Details Patient Name: Date of Service: [name] 04/01/2019 1:00 PM Medical Record [name] Patient Account Number: [id number] Date of Birth/Sex: Treating RN: 04/01/1956 (63 y.o. [name] Primary Care Provider: [name] Other Clinician: Referring Provider: Treating Provider/Extender:Robson, [name], [name] in Treatment: 35 History of Present Illness HPI Description: ADMISSION 07/29/2018 [name] is a 63 year old man with either prediabetes or diabetes he is on glipizide. In late October to November 2019 he noted a scabbed area on the back of his right calf. He picked this off a few times but it would not heal. He saw his primary physician on 12/5. It was felt at that time that this may actually heal on its own with conservative management. The next visit was on 07/20/2018 noted the area was a lot worse and arranged for his treatment here. He has been topical antibiotics like Neosporin although he stopped using this when the wound looked worse. He is just been covering this with a clean Band-Aid. He is given Bactrim a week ago and he is finishing this currently. It is made some improvement in the surrounding erythema per the patient. He does not have a history of nonhealing wounds. No prior history of wounds on his legs that he had difficulty healing. No prior skin issues. 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 Past medical history; A. fib status post ablation and recent cardioversion he is on Xarelto. He has a history of systolic heart failure, cervical radiculopathy, cardiomyopathy and type 2 diabetes as discussed ABI in the right leg was noncompressible on the right 2/20; the biopsy I did on the patient last week was negative for malignancy. Culture grew Pseudomonas and methicillin sensitive staph aureus. He is on cefdinir 300 twice a day. I will have to  make this a 10 day course. He put him in compression. He states that the redness pain and erythema are a lot better. I suspect the patient has chronic venous insufficiency probably with a secondary cellulitis 2/27; arrives today with copious amounts of drainage coming out of the wound irritating the skin below the wound area. He only renewed the final 3 days of cefdinir today 3/5; came in for a nurse change 3 days ago. Again a lot of drainage noted. Zinc oxide was applied unfortunately the drainage appears to a pool then he has a string of superficial open areas extending down into the Achilles area and some just below the wound. The wound itself does not look too bad. He is completed the antibiotics although apparently there was separation from the original 7 with the last 3 days. Culture grew a few MSSA and a few Pseudomonas 3/12; not too much difference over the last week. He came in for a dressing change on Monday by our nursing staff. Necrotic debris again over the wound surface. The entire area looks irritated but nontender and I do not think shows obvious evidence of infection. We have not heard anything about the reflux studies. Also notable than in this diabetic man we had noncompressible vessels and although I can feel his pulses easily in his feet I will order arterial studies as well. 3/19-Patient had experience more pain has been dressed with a silver alginate, the wounds all have necrotic debris, very friable with easy bleeding with any kind of surface debridement including with gauze and Anasept and with a #3 curette. His vascular appointments unfortunately were all canceled on  account of the virus outbreak resulting in studies only being done for emergent cases. His pulses are easily palpable in the lower extremity. He has open areas below the primary wound where he states the drainage which included purulent material also with some blood pooled and caused breakdown. 3/26; the  patient was changed to Santyl with [facility] backing last week. This was largely because the silver alginate was sticking to the wound. He is still having quite a bit of pain. He tells [place] that the reflux studies and arterial studies we had attempted to arrange through vein and vascular are not being booked until mid May. Culture that was done last week showed both Serratia moderate and a few methicillin sensitive staph aureus I started him on Bactrim DS 1 p.o. twice daily on 3/23 for 7 days. He is here for follow-up. 4/3; patient is on Santyl with Hydrofera Blue. Patient complains of pain. Our intake nurse is noted drainage and some odor. He has completed Bactrim recently for Serratia and a few methicillin sensitive staph aureus. After our staff push hard last week we were able to get venous studies as well as arterial studies. His arterial studies showed on the right great toe pressure of 0.86. On the left ABI at 1.47 TBI of 0.87. On both sides waveforms were triphasic VENOUS STUDIES showed reflux in the femoral vein, popliteal vein great saphenous vein at the saphenofemoral junction and great saphenous vein at the proximal thigh. Also noted to have age-indeterminate superficial vein thrombosis involving the small saphenous vein. Notable that the patient is already on Xarelto for atrial fibrillation. 4/9; the patient has been to see vascular surgery and reviewed by Dr. [name]. He was felt to have only minimal superficial venous reflux in the right great greater saphenous vein. For this reason he was not felt to be a candidate for laser ablation of the right greater saphenous vein. A wedge cushion was suggested to keep his leg elevated. He does have deep venous reflux on the right. Culture I did last time showed a combination of Serratia Pseudomonas and methicillin sensitive staph aureus. We have been using Hydrofera Blue to the large wound, Santyl to some of the deep punched out satellite  lesions distal to it. There is some improvement [id number]; the application for Apligraf was put out for further review for material that we have resubmitted. He has the deep area on the right posterior calf which is large and then 3 small punched out areas beneath this. In general his wounds look a lot better 4/23; the patient has his large original wound and then 2 punched out satellite lesions below it on the right posterior calf. I was usually able to use Apligraf #1 to cover the full surface area of the larger wound. We continued with Santyl and Hydrofera Blue on the 2 punched-out areas 5/7; patient has his large original wound and 2 punched out satellite lesions below it in the right posterior calf. Apligraf #2 today. Major improvement in the big wound in terms of wound depth. Punched-out Warren wounds have a very healthy looking wound bed. 5/21-Patient comes back for his large right posterior calf wound for which she has been an Apligraf #3 today. Wound depth seems to be better, the punched-out wounds appear to have healthy bed with some bleeding 6/4; right posterior calf wound is much better. Apligraf #4 today. The major wound has no wound depth at all. Only 1 of the satellite lesions is still open. The patient  has very high blood pressure coming in today with a diastolic blood pressure of 130 after lying there for a few minutes it was down to 110. He states that is running [id number] diastolic at home. He was high last week as well I thought he was on 50 mg 1/2 tablet twice daily of losartan I told him to double up on that however it turns out he is actually on 50 twice daily. Course he is run out of his medications prematurely. He has a follow-up with his primary's office next Wednesday. 6/18; patient's wounds continue to improve. The small area laterally is just about closed and there is continued epithelialization on the area on the posterior calf. 7/2; we applied his last Apligraf last  visit. Early the next week there was a lot of purulent looking drainage that I cultured this grew staph and Pseudomonas however when we had him back for the next visit to 3 days later everything looked a lot better. He did not receive systemic antibiotics. Fortunately we did not have to remove the Apligraf. He has had heart trouble this week he was having an ablation apparently they have discovered a clot in his left atrium they have changed all his anticoagulants as well as his blood pressure medication. 7/16; using Hydrofera Blue. We are making nice progress towards closure. He has not yet ordered his compression stockings he has his measurements 7/23; dimensions are better. He has a new satellite area superiorly. Both wounds cauterized with silver nitrate. 7/30; absolutely no change in the major wound on the posterior part of the right calf. We have been using Hydrofera Blue for a prolonged period of time and really had a nice improvement but over the last few weeks this is stalled. There is no depth. He arrived in clinic today with a new area on the right anterior tibia which apparently was an "ingrown hair". It is clearly an open area. This looks like a wrap injury although he was not really aware of it. 8/31; since the patient was last in clinic he was admitted to hospital from 8/17 through 8/22. He had had multiple falls at home including one off a ladder. He was admitted to hospital with mild COVID-19 viral pneumonia. Noted to be hyperkalemic and in acute renal failure. He has chronic systolic heart failure with ejection fraction of 35%. He arrives back in clinic with the original wound on the posterior aspect of the right lower calf looking a lot better. He has eschared areas from falls and lacerations on his anterior knees bilaterally just below the patella and on the mid part of his tibia bilaterally. He seems to have a small area on the right lateral ankle as well 9/10- Patient comes  to clinic with a 2 new wounds one on the left anterior shin and one on the right posterior leg both the left knee and right knee areas are healed up. We have been using Santyl to the left anterior leg and silver alginate to right posterior leg wounds. 9/17; the patient is original wound looks good on the posterior right calf. He has traumatic areas on the left anterior shin left anterior patellar tendon and the right mid tibia area. Except for the right posterior calf all required debridement. We have been using Santyl on these areas and silver alginate posteriorly right calf 9/24; the original wound on the posterior calf on the right looks good. This is healthy. There are traumatic wounds in the left anterior shin,  left anterior patella in the right mid tibia area are about the same. He has dusky erythema on the left anterior tibia which led me to give him antibiotics last week. This does not look too much different. This is nontender and I wonder if this is all just venous inflammation. 10/2; the patient has 2 open areas on the right posterior calf both of these look good we have been using silver alginate. He has traumatic wounds on the left and right mid tibia areas. The surface of these wounds is still not ready for closure. We have been using Iodoflex. Finally has areas on the left knee. The latter wounds are all traumatic after a fall 10/9. His original wounds of the right posterior calf are superficial and progressing towards closure. His traumatic wounds on the left anterior tibia and right anterior tibia are considerably improved in terms of the wound surface. He asked me about a skin graft in these areas I do not think this is going to be necessary. Change from Iodosorb to [facility]. The area on the left knee is just about closed as well 10/16; his original wound on the right posterior calf very superficial looks healthy. He has more recent traumatic wounds on the left anterior  tibia and right anterior tibia both of these have better looking surfaces and measuring smaller. Might be beneficial for an advanced treatment product. The area on his left knee has a small superficial scab and we are going to close that when Electronic Signature(s) Signed: 04/01/2019 5:33:39 PM By: Linton Ham MD Entered By: Linton Ham on 04/01/2019 13:55:10 -------------------------------------------------------------------------------- Physical Exam Details Patient Name: Date of Service: [name] 04/01/2019 1:00 PM Medical Record [id number] Patient Account Number: [id number] Date of Birth/Sex: Treating RN: [birth year] (63 y.o. [name] Primary Care Provider: [name] Other Clinician: Referring Provider: Treating Provider/Extender:Robson, [name], [name] in Treatment: 18 Constitutional Patient is hypertensive.. Pulse regular and within target range for patient.[name] Respirations regular, non-labored and within target range.. Temperature is normal and within the target range for the patient.[name] Appears in no distress. Eyes Conjunctivae clear. No discharge.no icterus. Respiratory work of breathing is normal. Cardiovascular Pedal pulses palpable and strong bilaterally.. Good edema control. Integumentary (Hair, Skin) No erythema around any wound. Psychiatric appears at normal baseline. Notes Wound exam; left anterior and right anterior shin look much better. Debrided with Anasept and gauze without mechanical debridement Original wound on the right posterior calf one small area has closed his major area looks superficial. The area on the left kne has a a small eschar but that is close to closing as well Electronic Signature(s) Signed: 04/01/2019 5:33:39 PM By: Linton Ham MD Entered By: Linton Ham on 04/01/2019 14:01:37 -------------------------------------------------------------------------------- Physician Orders  Details Patient Name: Date of Service: [name] 04/01/2019 1:00 PM Medical Record [id number] Patient Account Number: [id number] Date of Birth/Sex: Treating RN: Feb 03, 1956 (63 y.o. [name] Primary Care Provider: [name] Other Clinician: Referring Provider: Treating Provider/Extender:Robson, [name], [name] in Treatment: [phone] Verbal / Phone Orders: No Diagnosis Coding ICD-10 Coding Code Description [phone] Non-pressure chronic ulcer of right calf with fat layer exposed E11.622 Type 2 diabetes mellitus with other skin ulcer I87.321 Chronic venous hypertension (idiopathic) with inflammation of right lower extremity S81.811D Laceration without foreign body, right lower leg, subsequent encounter S81.812D Laceration without foreign body, left lower leg, subsequent encounter L03.115 Cellulitis of right lower limb Follow-up Appointments Return Appointment in 1 week. Dressing Change Frequency Do not  change entire dressing for one week. - both lower legs. Skin Barriers/Peri-Wound Care Moisturizing lotion - both legs Wound Cleansing May shower with protection. Primary Wound Dressing Wound #6 Right,Proximal,Posterior Lower Leg Hydrofera Blue Wound #7 Right,Anterior Lower Leg Hydrofera Blue Wound #8 Left,Anterior Lower Leg Hydrofera Blue Secondary Dressing Wound #6 Right,Proximal,Posterior Lower Leg ABD pad Kerramax - or zetvuit Wound #7 Right,Anterior Lower Leg ABD pad Kerramax - or zetvuit Wound #8 Left,Anterior Lower Leg ABD pad Kerramax - or zetvuit Edema Control 4 layer compression - Bilateral Avoid standing for long periods of time Elevate legs to the level of the heart or above for 30 minutes daily and/or when sitting, a frequency of: - 3-4 times a day. Electronic Signature(s) Signed: 04/01/2019 5:33:39 PM By: Linton Ham MD Signed: 04/01/2019 6:00:55 PM By: Levan Hurst RN, BSN Entered By: Levan Hurst on  04/01/2019 13:30:34 -------------------------------------------------------------------------------- Problem List Details Patient Name: Date of Service: [name], [name] 04/01/2019 1:00 PM Medical Record [id number] Patient Account Number: [id number] Date of Birth/Sex: Treating RN: 12-12-1955 (63 y.o. [name] Primary Care Provider: [name] Other Clinician: Referring Provider: Treating Provider/Extender:Robson, [name], [name] in Treatment: 35 Active Problems ICD-10 Evaluated Encounter Code Description Active Date Today Diagnosis L97.212 Non-pressure chronic ulcer of right calf with fat layer 07/29/2018 No Yes exposed E11.622 Type 2 diabetes mellitus with other skin ulcer 07/29/2018 No Yes I87.321 Chronic venous hypertension (idiopathic) with 07/29/2018 No Yes inflammation of right lower extremity S81.811D Laceration without foreign body, right lower leg, 02/14/2019 No Yes subsequent encounter S81.812D Laceration without foreign body, left lower leg, 02/14/2019 No Yes subsequent encounter L03.115 Cellulitis of right lower limb 09/24/2018 No Yes Inactive Problems Resolved Problems Electronic Signature(s) Signed: 04/01/2019 5:33:39 PM By: Linton Ham MD Entered By: Linton Ham on 04/01/2019 13:54:20 -------------------------------------------------------------------------------- Progress Note Details Patient Name: Date of Service: [name] 04/01/2019 1:00 PM Medical Record [id number] Patient Account Number: [id number] Date of Birth/Sex: Treating RN: 1955-11-06 (63 y.o. [name] Primary Care Provider: [name] Other Clinician: Referring Provider: Treating Provider/Extender:Robson, [name], [name] in Treatment: 35 Subjective History of Present Illness (HPI) ADMISSION 07/29/2018 [name] is a 63 year old man with either prediabetes or diabetes he is on glipizide. In late  October to November 2019 he noted a scabbed area on the back of his right calf. He picked this off a few times but it would not heal. He saw his primary physician on 12/5. It was felt at that time that this may actually heal on its own with conservative management. The next visit was on 07/20/2018 noted the area was a lot worse and arranged for his treatment here. He has been topical antibiotics like Neosporin although he stopped using this when the wound looked worse. He is just been covering this with a clean Band-Aid. He is given Bactrim a week ago and he is finishing this currently. It is made some improvement in the surrounding erythema per the patient. He does not have a history of nonhealing wounds. No prior history of wounds on his legs that he had difficulty healing. No prior skin issues. 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 Past medical history; A. fib status post ablation and recent cardioversion he is on Xarelto. He has a history of systolic heart failure, cervical radiculopathy, cardiomyopathy and type 2 diabetes as discussed ABI in the right leg was noncompressible on the right 2/20; the biopsy I did on the patient last week was negative for malignancy.  Culture grew Pseudomonas and methicillin sensitive staph aureus. He is on cefdinir 300 twice a day. I will have to make this a 10 day course. He put him in compression. He states that the redness pain and erythema are a lot better. I suspect the patient has chronic venous insufficiency probably with a secondary cellulitis 2/27; arrives today with copious amounts of drainage coming out of the wound irritating the skin below the wound area. He only renewed the final 3 days of cefdinir today 3/5; came in for a nurse change 3 days ago. Again a lot of drainage noted. Zinc oxide was applied unfortunately the drainage appears to a pool then he has a string of superficial open areas extending down into the Achilles area  and some just below the wound. The wound itself does not look too bad. He is completed the antibiotics although apparently there was separation from the original 7 with the last 3 days. Culture grew a few MSSA and a few Pseudomonas 3/12; not too much difference over the last week. He came in for a dressing change on Monday by our nursing staff. Necrotic debris again over the wound surface. The entire area looks irritated but nontender and I do not think shows obvious evidence of infection. We have not heard anything about the reflux studies. Also notable than in this diabetic man we had noncompressible vessels and although I can feel his pulses easily in his feet I will order arterial studies as well. 3/19-Patient had experience more pain has been dressed with a silver alginate, the wounds all have necrotic debris, very friable with easy bleeding with any kind of surface debridement including with gauze and Anasept and with a #3 curette. His vascular appointments unfortunately were all canceled on account of the virus outbreak resulting in studies only being done for emergent cases. His pulses are easily palpable in the lower extremity. He has open areas below the primary wound where he states the drainage which included purulent material also with some blood pooled and caused breakdown. 3/26; the patient was changed to Santyl with [facility] backing last week. This was largely because the silver alginate was sticking to the wound. He is still having quite a bit of pain. He tells [place] that the reflux studies and arterial studies we had attempted to arrange through vein and vascular are not being booked until mid May. Culture that was done last week showed both Serratia moderate and a few methicillin sensitive staph aureus I started him on Bactrim DS 1 p.o. twice daily on 3/23 for 7 days. He is here for follow-up. 4/3; patient is on Santyl with Hydrofera Blue. Patient complains of pain. Our  intake nurse is noted drainage and some odor. He has completed Bactrim recently for Serratia and a few methicillin sensitive staph aureus. After our staff push hard last week we were able to get venous studies as well as arterial studies. His arterial studies showed on the right great toe pressure of 0.86. On the left ABI at 1.47 TBI of 0.87. On both sides waveforms were triphasic VENOUS STUDIES showed reflux in the femoral vein, popliteal vein great saphenous vein at the saphenofemoral junction and great saphenous vein at the proximal thigh. Also noted to have age-indeterminate superficial vein thrombosis involving the small saphenous vein. Notable that the patient is already on Xarelto for atrial fibrillation. 4/9; the patient has been to see vascular surgery and reviewed by Dr. [name]. He was felt to have only minimal superficial  venous reflux in the right great greater saphenous vein. For this reason he was not felt to be a candidate for laser ablation of the right greater saphenous vein. A wedge cushion was suggested to keep his leg elevated. He does have deep venous reflux on the right. Culture I did last time showed a combination of Serratia Pseudomonas and methicillin sensitive staph aureus. We have been using Hydrofera Blue to the large wound, Santyl to some of the deep punched out satellite lesions distal to it. There is some improvement [id number]; the application for Apligraf was put out for further review for material that we have resubmitted. He has the deep area on the right posterior calf which is large and then 3 small punched out areas beneath this. In general his wounds look a lot better 4/23; the patient has his large original wound and then 2 punched out satellite lesions below it on the right posterior calf. I was usually able to use Apligraf #1 to cover the full surface area of the larger wound. We continued with Santyl and Hydrofera Blue on the 2 punched-out areas 5/7; patient  has his large original wound and 2 punched out satellite lesions below it in the right posterior calf. Apligraf #2 today. Major improvement in the big wound in terms of wound depth. Punched-out Warren wounds have a very healthy looking wound bed. 5/21-Patient comes back for his large right posterior calf wound for which she has been an Apligraf #3 today. Wound depth seems to be better, the punched-out wounds appear to have healthy bed with some bleeding 6/4; right posterior calf wound is much better. Apligraf #4 today. The major wound has no wound depth at all. Only 1 of the satellite lesions is still open. The patient has very high blood pressure coming in today with a diastolic blood pressure of 130 after lying there for a few minutes it was down to 110. He states that is running [id number] diastolic at home. He was high last week as well I thought he was on 50 mg 1/2 tablet twice daily of losartan I told him to double up on that however it turns out he is actually on 50 twice daily. Course he is run out of his medications prematurely. He has a follow-up with his primary's office next Wednesday. 6/18; patient's wounds continue to improve. The small area laterally is just about closed and there is continued epithelialization on the area on the posterior calf. 7/2; we applied his last Apligraf last visit. Early the next week there was a lot of purulent looking drainage that I cultured this grew staph and Pseudomonas however when we had him back for the next visit to 3 days later everything looked a lot better. He did not receive systemic antibiotics. Fortunately we did not have to remove the Apligraf. He has had heart trouble this week he was having an ablation apparently they have discovered a clot in his left atrium they have changed all his anticoagulants as well as his blood pressure medication. 7/16; using Hydrofera Blue. We are making nice progress towards closure. He has not yet ordered his  compression stockings he has his measurements 7/23; dimensions are better. He has a new satellite area superiorly. Both wounds cauterized with silver nitrate. 7/30; absolutely no change in the major wound on the posterior part of the right calf. We have been using Hydrofera Blue for a prolonged period of time and really had a nice improvement but over the last few  weeks this is stalled. There is no depth. He arrived in clinic today with a new area on the right anterior tibia which apparently was an "ingrown hair". It is clearly an open area. This looks like a wrap injury although he was not really aware of it. 8/31; since the patient was last in clinic he was admitted to hospital from 8/17 through 8/22. He had had multiple falls at home including one off a ladder. He was admitted to hospital with mild COVID-19 viral pneumonia. Noted to be hyperkalemic and in acute renal failure. He has chronic systolic heart failure with ejection fraction of 35%. He arrives back in clinic with the original wound on the posterior aspect of the right lower calf looking a lot better. He has eschared areas from falls and lacerations on his anterior knees bilaterally just below the patella and on the mid part of his tibia bilaterally. He seems to have a small area on the right lateral ankle as well 9/10- Patient comes to clinic with a 2 new wounds one on the left anterior shin and one on the right posterior leg both the left knee and right knee areas are healed up. We have been using Santyl to the left anterior leg and silver alginate to right posterior leg wounds. 9/17; the patient is original wound looks good on the posterior right calf. He has traumatic areas on the left anterior shin left anterior patellar tendon and the right mid tibia area. Except for the right posterior calf all required debridement. We have been using Santyl on these areas and silver alginate posteriorly right calf 9/24; the original wound on  the posterior calf on the right looks good. This is healthy. There are traumatic wounds in the left anterior shin, left anterior patella in the right mid tibia area are about the same. He has dusky erythema on the left anterior tibia which led me to give him antibiotics last week. This does not look too much different. This is nontender and I wonder if this is all just venous inflammation. 10/2; the patient has 2 open areas on the right posterior calf both of these look good we have been using silver alginate. He has traumatic wounds on the left and right mid tibia areas. The surface of these wounds is still not ready for closure. We have been using Iodoflex. Finally has areas on the left knee. The latter wounds are all traumatic after a fall 10/9. His original wounds of the right posterior calf are superficial and progressing towards closure. His traumatic wounds on the left anterior tibia and right anterior tibia are considerably improved in terms of the wound surface. He asked me about a skin graft in these areas I do not think this is going to be necessary. Change from Iodosorb to [facility]. ooThe area on the left knee is just about closed as well 10/16; his original wound on the right posterior calf very superficial looks healthy. He has more recent traumatic wounds on the left anterior tibia and right anterior tibia both of these have better looking surfaces and measuring smaller. Might be beneficial for an advanced treatment product. The area on his left knee has a small superficial scab and we are going to close that when Objective Constitutional Patient is hypertensive.. Pulse regular and within target range for patient.[name] Respirations regular, non-labored and within target range.. Temperature is normal and within the target range for the patient.[name] Appears in no distress. Vitals Time Taken: 1:09 PM, Height:  73 in, Weight: 290 lbs, BMI: 38.3, Temperature: 97.8 F, Pulse: 66  bpm, Respiratory Rate: 18 breaths/min, Blood Pressure: 154/98 mmHg. Eyes Conjunctivae clear. No discharge.no icterus. Respiratory work of breathing is normal. Cardiovascular Pedal pulses palpable and strong bilaterally.. Good edema control. Psychiatric appears at normal baseline. General Notes: Wound exam; left anterior and right anterior shin look much better. Debrided with Anasept and gauze without mechanical debridement ooOriginal wound on the right posterior calf one small area has closed his major area looks superficial. ooThe area on the left kne has a a small eschar but that is close to closing as well Integumentary (Hair, Skin) No erythema around any wound. Wound #1 status is Open. Original cause of wound was Gradually Appeared. The wound is located on the Right,Posterior Calf. The wound measures 0cm length x 0cm width x 0cm depth; 0cm^2 area and 0cm^3 volume. There is Fat Layer (Subcutaneous Tissue) Exposed exposed. There is no tunneling or undermining noted. There is a none present amount of drainage noted. The wound margin is flat and intact. There is no granulation within the wound bed. There is no necrotic tissue within the wound bed. Wound #11 status is Healed - Epithelialized. Original cause of wound was Blister. The wound is located on the Left Knee. The wound measures 0cm length x 0cm width x 0cm depth; 0cm^2 area and 0cm^3 volume. There is no tunneling or undermining noted. There is a none present amount of drainage noted. The wound margin is distinct with the outline attached to the wound base. There is no granulation within the wound bed. There is no necrotic tissue within the wound bed. Wound #6 status is Open. Original cause of wound was Gradually Appeared. The wound is located on the Right,Proximal,Posterior Lower Leg. The wound measures 0.8cm length x 1cm width x 0.1cm depth; 0.628cm^2 area and 0.063cm^3 volume. There is Fat Layer (Subcutaneous Tissue) Exposed  exposed. There is no tunneling or undermining noted. There is a medium amount of serosanguineous drainage noted. The wound margin is flat and intact. There is large (67-100%) red, pink granulation within the wound bed. There is no necrotic tissue within the wound bed. Wound #7 status is Open. Original cause of wound was Trauma. The wound is located on the Right,Anterior Lower Leg. The wound measures 2cm length x 1.3cm width x 0.1cm depth; 2.042cm^2 area and 0.204cm^3 volume. There is Fat Layer (Subcutaneous Tissue) Exposed exposed. There is no tunneling or undermining noted. There is a medium amount of serosanguineous drainage noted. The wound margin is well defined and not attached to the wound base. There is large (67-100%) red, pink granulation within the wound bed. There is a small (1-33%) amount of necrotic tissue within the wound bed including Adherent Slough. Wound #8 status is Open. Original cause of wound was Gradually Appeared. The wound is located on the Left,Anterior Lower Leg. The wound measures 9.2cm length x 2.6cm width x 0.1cm depth; 18.787cm^2 area and 1.879cm^3 volume. There is Fat Layer (Subcutaneous Tissue) Exposed exposed. There is no tunneling or undermining noted. There is a medium amount of serosanguineous drainage noted. The wound margin is distinct with the outline attached to the wound base. There is medium (34-66%) red granulation within the wound bed. There is a medium (34-66%) amount of necrotic tissue within the wound bed including Adherent Slough. Assessment Active Problems ICD-10 Non-pressure chronic ulcer of right calf with fat layer exposed Type 2 diabetes mellitus with other skin ulcer Chronic venous hypertension (idiopathic) with inflammation of right  lower extremity Laceration without foreign body, right lower leg, subsequent encounter Laceration without foreign body, left lower leg, subsequent encounter Cellulitis of right lower limb Procedures Wound  #6 Pre-procedure diagnosis of Wound #6 is a Venous Leg Ulcer located on the Right,Proximal,Posterior Lower Leg . There was a Four Layer Compression Therapy Procedure by Levan Hurst, RN. Post procedure Diagnosis Wound #6: Same as Pre-Procedure Wound #7 Pre-procedure diagnosis of Wound #7 is a Venous Leg Ulcer located on the Right,Anterior Lower Leg . There was a Four Layer Compression Therapy Procedure by Levan Hurst, RN. Post procedure Diagnosis Wound #7: Same as Pre-Procedure Wound #8 Pre-procedure diagnosis of Wound #8 is a Venous Leg Ulcer located on the Left,Anterior Lower Leg . There was a Four Layer Compression Therapy Procedure by Levan Hurst, RN. Post procedure Diagnosis Wound #8: Same as Pre-Procedure Plan Follow-up Appointments: Return Appointment in 1 week. Dressing Change Frequency: Do not change entire dressing for one week. - both lower legs. Skin Barriers/Peri-Wound Care: Moisturizing lotion - both legs Wound Cleansing: May shower with protection. Primary Wound Dressing: Wound #6 Right,Proximal,Posterior Lower Leg: Hydrofera Blue Wound #7 Right,Anterior Lower Leg: Hydrofera Blue Wound #8 Left,Anterior Lower Leg: Hydrofera Blue Secondary Dressing: Wound #6 Right,Proximal,Posterior Lower Leg: ABD pad Kerramax - or zetvuit Wound #7 Right,Anterior Lower Leg: ABD pad Kerramax - or zetvuit Wound #8 Left,Anterior Lower Leg: ABD pad Kerramax - or zetvuit Edema Control: 4 layer compression - Bilateral Avoid standing for long periods of time Elevate legs to the level of the heart or above for 30 minutes daily and/or when sitting, a frequency of: - 3-4 times a day. 1. Continue with Hydrofera Blue to all wound areas. We are making remarkable progress. 2. Run Apligraf through his insurance for the anterior tibial wounds bilaterally in the setting of chronic venous insufficiency Electronic Signature(s) Signed: 04/01/2019 2:02:19 PM By: Linton Ham  MD Entered By: Linton Ham on 04/01/2019 14:02:18 -------------------------------------------------------------------------------- SuperBill Details Patient Name: Date of Service: [name] 04/01/2019 Medical Record [phone] Patient Account Number: [id number] Date of Birth/Sex: Treating RN: 01-Apr-1956 (63 y.o. [name] Primary Care Provider: [name] Other Clinician: Referring Provider: Treating Provider/Extender:Robson, [name], [name] in Treatment: 35 Diagnosis Coding ICD-10 Codes Code Description [phone] Non-pressure chronic ulcer of right calf with fat layer exposed E11.622 Type 2 diabetes mellitus with other skin ulcer I87.321 Chronic venous hypertension (idiopathic) with inflammation of right lower extremity S81.811D Laceration without foreign body, right lower leg, subsequent encounter S81.812D Laceration without foreign body, left lower leg, subsequent encounter L03.115 Cellulitis of right lower limb Facility Procedures CPT4: Code [id number] Description: [id number] BILATERAL: Application of multi-layer venous compression system; leg (below knee), including ankle and foot. Modifier Quantity: 1 Physician Procedures CPT4 Code Description: [id number] - WC PHYS LEVEL 3 - EST PT ICD-10 Diagnosis Description L97.212 Non-pressure chronic ulcer of right calf with fat layer expos I87.321 Chronic venous hypertension (idiopathic) with inflammation of extremity Modifier: ed right lower Quantity: 1 Electronic Signature(s) Signed: 04/01/2019 5:33:39 PM By: Linton Ham MD Entered By: Linton Ham on 04/01/2019 14:02:48

## 2019-04-06 ENCOUNTER — Telehealth (HOSPITAL_COMMUNITY): Payer: Self-pay

## 2019-04-06 NOTE — Telephone Encounter (Signed)
Spoke with the patient, instructions given. He stated that he would be here for his test tomorrow. Asked to call back with any questions. [name] EMTP

## 2019-04-07 ENCOUNTER — Other Ambulatory Visit: Payer: Self-pay

## 2019-04-07 ENCOUNTER — Ambulatory Visit (HOSPITAL_COMMUNITY): Payer: BLUE CROSS/BLUE SHIELD | Attending: Cardiology

## 2019-04-07 VITALS — Ht 73.0 in | Wt 279.0 lb

## 2019-04-07 DIAGNOSIS — I480 Paroxysmal atrial fibrillation: Secondary | ICD-10-CM | POA: Diagnosis not present

## 2019-04-07 DIAGNOSIS — R0602 Shortness of breath: Secondary | ICD-10-CM | POA: Diagnosis not present

## 2019-04-07 DIAGNOSIS — I5042 Chronic combined systolic (congestive) and diastolic (congestive) heart failure: Secondary | ICD-10-CM | POA: Insufficient documentation

## 2019-04-07 MED ORDER — TECHNETIUM TC 99M TETROFOSMIN IV KIT
32.3000 | PACK | Freq: Once | INTRAVENOUS | Status: AC | PRN
  Administered 2019-04-07: 32.3 via INTRAVENOUS
  Filled 2019-04-07: qty 33

## 2019-04-07 MED ORDER — REGADENOSON 0.4 MG/5ML IV SOLN
0.4000 mg | Freq: Once | INTRAVENOUS | Status: AC
  Administered 2019-04-07: 0.4 mg via INTRAVENOUS

## 2019-04-08 ENCOUNTER — Encounter (HOSPITAL_BASED_OUTPATIENT_CLINIC_OR_DEPARTMENT_OTHER): Payer: BLUE CROSS/BLUE SHIELD | Admitting: Internal Medicine

## 2019-04-08 ENCOUNTER — Ambulatory Visit (HOSPITAL_COMMUNITY): Payer: BLUE CROSS/BLUE SHIELD | Attending: Internal Medicine

## 2019-04-08 DIAGNOSIS — L97212 Non-pressure chronic ulcer of right calf with fat layer exposed: Secondary | ICD-10-CM | POA: Diagnosis not present

## 2019-04-08 DIAGNOSIS — G4730 Sleep apnea, unspecified: Secondary | ICD-10-CM | POA: Diagnosis not present

## 2019-04-08 DIAGNOSIS — E11622 Type 2 diabetes mellitus with other skin ulcer: Secondary | ICD-10-CM | POA: Diagnosis not present

## 2019-04-08 DIAGNOSIS — L97822 Non-pressure chronic ulcer of other part of left lower leg with fat layer exposed: Secondary | ICD-10-CM | POA: Diagnosis not present

## 2019-04-08 DIAGNOSIS — I87321 Chronic venous hypertension (idiopathic) with inflammation of right lower extremity: Secondary | ICD-10-CM | POA: Diagnosis not present

## 2019-04-08 DIAGNOSIS — I87313 Chronic venous hypertension (idiopathic) with ulcer of bilateral lower extremity: Secondary | ICD-10-CM | POA: Diagnosis not present

## 2019-04-08 DIAGNOSIS — L97812 Non-pressure chronic ulcer of other part of right lower leg with fat layer exposed: Secondary | ICD-10-CM | POA: Diagnosis not present

## 2019-04-08 DIAGNOSIS — I110 Hypertensive heart disease with heart failure: Secondary | ICD-10-CM | POA: Diagnosis not present

## 2019-04-08 DIAGNOSIS — I509 Heart failure, unspecified: Secondary | ICD-10-CM | POA: Diagnosis not present

## 2019-04-08 LAB — MYOCARDIAL PERFUSION IMAGING
LV dias vol: 145 mL (ref 62–150)
LV sys vol: 74 mL
Peak HR: 67 {beats}/min
Rest HR: 62 {beats}/min
SDS: 2
SRS: 0
SSS: 2
TID: 1.09

## 2019-04-08 MED ORDER — TECHNETIUM TC 99M TETROFOSMIN IV KIT
32.3000 | PACK | Freq: Once | INTRAVENOUS | Status: DC | PRN
  Filled 2019-04-08: qty 33

## 2019-04-08 NOTE — Progress Notes (Signed)
[name], [name] (OP:[id number]) Visit Report for 04/08/2019 HPI Details Patient Name: Date of Service: [name], [name] 04/08/2019 2:30 PM Medical Record [id number] Patient Account Number: [id number] Date of Birth/Sex: Treating RN: 02-Sep-1955 (63 y.o. [name] Primary Care Provider: [name] Other Clinician: Referring Provider: Treating Provider/Extender:[name], [name], [name] in Treatment: 36 History of Present Illness HPI Description: ADMISSION 07/29/2018 [name] is a 63 year old man with either prediabetes or diabetes he is on glipizide. In late October to November 2019 he noted a scabbed area on the back of his right calf. He picked this off a few times but it would not heal. He saw his primary physician on 12/5. It was felt at that time that this may actually heal on its own with conservative management. The next visit was on 07/20/2018 noted the area was a lot worse and arranged for his treatment here. He has been topical antibiotics like Neosporin although he stopped using this when the wound looked worse. He is just been covering this with a clean Band-Aid. He is given Bactrim a week ago and he is finishing this currently. It is made some improvement in the surrounding erythema per the patient. He does not have a history of nonhealing wounds. No prior history of wounds on his legs that he had difficulty healing. No prior skin issues. 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 Past medical history; A. fib status post ablation and recent cardioversion he is on Xarelto. He has a history of systolic heart failure, cervical radiculopathy, cardiomyopathy and type 2 diabetes as discussed ABI in the right leg was noncompressible on the right 2/20; the biopsy I did on the patient last week was negative for malignancy. Culture grew Pseudomonas and methicillin sensitive staph aureus. He is on cefdinir 300 twice a day. I will have to  make this a 10 day course. He put him in compression. He states that the redness pain and erythema are a lot better. I suspect the patient has chronic venous insufficiency probably with a secondary cellulitis 2/27; arrives today with copious amounts of drainage coming out of the wound irritating the skin below the wound area. He only renewed the final 3 days of cefdinir today 3/5; came in for a nurse change 3 days ago. Again a lot of drainage noted. Zinc oxide was applied unfortunately the drainage appears to a pool then he has a string of superficial open areas extending down into the Achilles area and some just below the wound. The wound itself does not look too bad. He is completed the antibiotics although apparently there was separation from the original 7 with the last 3 days. Culture grew a few MSSA and a few Pseudomonas 3/12; not too much difference over the last week. He came in for a dressing change on Monday by our nursing staff. Necrotic debris again over the wound surface. The entire area looks irritated but nontender and I do not think shows obvious evidence of infection. We have not heard anything about the reflux studies. Also notable than in this diabetic man we had noncompressible vessels and although I can feel his pulses easily in his feet I will order arterial studies as well. 3/19-Patient had experience more pain has been dressed with a silver alginate, the wounds all have necrotic debris, very friable with easy bleeding with any kind of surface debridement including with gauze and Anasept and with a #3 curette. His vascular appointments unfortunately were all canceled on  account of the virus outbreak resulting in studies only being done for emergent cases. His pulses are easily palpable in the lower extremity. He has open areas below the primary wound where he states the drainage which included purulent material also with some blood pooled and caused breakdown. 3/26; the  patient was changed to Santyl with [facility] backing last week. This was largely because the silver alginate was sticking to the wound. He is still having quite a bit of pain. He tells [place] that the reflux studies and arterial studies we had attempted to arrange through vein and vascular are not being booked until mid May. Culture that was done last week showed both Serratia moderate and a few methicillin sensitive staph aureus I started him on Bactrim DS 1 p.o. twice daily on 3/23 for 7 days. He is here for follow-up. 4/3; patient is on Santyl with Hydrofera Blue. Patient complains of pain. Our intake nurse is noted drainage and some odor. He has completed Bactrim recently for Serratia and a few methicillin sensitive staph aureus. After our staff push hard last week we were able to get venous studies as well as arterial studies. His arterial studies showed on the right great toe pressure of 0.86. On the left ABI at 1.47 TBI of 0.87. On both sides waveforms were triphasic VENOUS STUDIES showed reflux in the femoral vein, popliteal vein great saphenous vein at the saphenofemoral junction and great saphenous vein at the proximal thigh. Also noted to have age-indeterminate superficial vein thrombosis involving the small saphenous vein. Notable that the patient is already on Xarelto for atrial fibrillation. 4/9; the patient has been to see vascular surgery and reviewed by Dr. [name]. He was felt to have only minimal superficial venous reflux in the right great greater saphenous vein. For this reason he was not felt to be a candidate for laser ablation of the right greater saphenous vein. A wedge cushion was suggested to keep his leg elevated. He does have deep venous reflux on the right. Culture I did last time showed a combination of Serratia Pseudomonas and methicillin sensitive staph aureus. We have been using Hydrofera Blue to the large wound, Santyl to some of the deep punched out satellite  lesions distal to it. There is some improvement [id number]; the application for Apligraf was put out for further review for material that we have resubmitted. He has the deep area on the right posterior calf which is large and then 3 small punched out areas beneath this. In general his wounds look a lot better 4/23; the patient has his large original wound and then 2 punched out satellite lesions below it on the right posterior calf. I was usually able to use Apligraf #1 to cover the full surface area of the larger wound. We continued with Santyl and Hydrofera Blue on the 2 punched-out areas 5/7; patient has his large original wound and 2 punched out satellite lesions below it in the right posterior calf. Apligraf #2 today. Major improvement in the big wound in terms of wound depth. Punched-out Warren wounds have a very healthy looking wound bed. 5/21-Patient comes back for his large right posterior calf wound for which she has been an Apligraf #3 today. Wound depth seems to be better, the punched-out wounds appear to have healthy bed with some bleeding 6/4; right posterior calf wound is much better. Apligraf #4 today. The major wound has no wound depth at all. Only 1 of the satellite lesions is still open. The patient  has very high blood pressure coming in today with a diastolic blood pressure of 130 after lying there for a few minutes it was down to 110. He states that is running [id number] diastolic at home. He was high last week as well I thought he was on 50 mg 1/2 tablet twice daily of losartan I told him to double up on that however it turns out he is actually on 50 twice daily. Course he is run out of his medications prematurely. He has a follow-up with his primary's office next Wednesday. 6/18; patient's wounds continue to improve. The small area laterally is just about closed and there is continued epithelialization on the area on the posterior calf. 7/2; we applied his last Apligraf last  visit. Early the next week there was a lot of purulent looking drainage that I cultured this grew staph and Pseudomonas however when we had him back for the next visit to 3 days later everything looked a lot better. He did not receive systemic antibiotics. Fortunately we did not have to remove the Apligraf. He has had heart trouble this week he was having an ablation apparently they have discovered a clot in his left atrium they have changed all his anticoagulants as well as his blood pressure medication. 7/16; using Hydrofera Blue. We are making nice progress towards closure. He has not yet ordered his compression stockings he has his measurements 7/23; dimensions are better. He has a new satellite area superiorly. Both wounds cauterized with silver nitrate. 7/30; absolutely no change in the major wound on the posterior part of the right calf. We have been using Hydrofera Blue for a prolonged period of time and really had a nice improvement but over the last few weeks this is stalled. There is no depth. He arrived in clinic today with a new area on the right anterior tibia which apparently was an "ingrown hair". It is clearly an open area. This looks like a wrap injury although he was not really aware of it. 8/31; since the patient was last in clinic he was admitted to hospital from 8/17 through 8/22. He had had multiple falls at home including one off a ladder. He was admitted to hospital with mild COVID-19 viral pneumonia. Noted to be hyperkalemic and in acute renal failure. He has chronic systolic heart failure with ejection fraction of 35%. He arrives back in clinic with the original wound on the posterior aspect of the right lower calf looking a lot better. He has eschared areas from falls and lacerations on his anterior knees bilaterally just below the patella and on the mid part of his tibia bilaterally. He seems to have a small area on the right lateral ankle as well 9/10- Patient comes  to clinic with a 2 new wounds one on the left anterior shin and one on the right posterior leg both the left knee and right knee areas are healed up. We have been using Santyl to the left anterior leg and silver alginate to right posterior leg wounds. 9/17; the patient is original wound looks good on the posterior right calf. He has traumatic areas on the left anterior shin left anterior patellar tendon and the right mid tibia area. Except for the right posterior calf all required debridement. We have been using Santyl on these areas and silver alginate posteriorly right calf 9/24; the original wound on the posterior calf on the right looks good. This is healthy. There are traumatic wounds in the left anterior shin,  left anterior patella in the right mid tibia area are about the same. He has dusky erythema on the left anterior tibia which led me to give him antibiotics last week. This does not look too much different. This is nontender and I wonder if this is all just venous inflammation. 10/2; the patient has 2 open areas on the right posterior calf both of these look good we have been using silver alginate. He has traumatic wounds on the left and right mid tibia areas. The surface of these wounds is still not ready for closure. We have been using Iodoflex. Finally has areas on the left knee. The latter wounds are all traumatic after a fall 10/9. His original wounds of the right posterior calf are superficial and progressing towards closure. His traumatic wounds on the left anterior tibia and right anterior tibia are considerably improved in terms of the wound surface. He asked me about a skin graft in these areas I do not think this is going to be necessary. Change from Iodosorb to [facility]. The area on the left knee is just about closed as well 10/16; his original wound on the right posterior calf very superficial looks healthy. He has more recent traumatic wounds on the left anterior  tibia and right anterior tibia both of these have better looking surfaces and measuring smaller. Might be beneficial for an advanced treatment product. The area on his left knee has a small superficial scab and we are going to close that when 10/23; his original wound areas on the right posterior calf continue to improve. The more recently traumatic wounds on the left anterior tibia and right anterior tibia both look better in terms of surfaces. No debridement required. We have been using Hydrofera Blue. He is approved for Apligraf Electronic Signature(s) Signed: 04/08/2019 6:06:00 PM By: Linton Ham MD Entered By: Linton Ham on 04/08/2019 16:31:33 -------------------------------------------------------------------------------- Physical Exam Details Patient Name: Date of Service: [name] 04/08/2019 2:30 PM Medical Record [id number] Patient Account Number: [id number] Date of Birth/Sex: Treating RN: [birth year] (63 y.o. [name] Primary Care Provider: [name] Other Clinician: Referring Provider: Treating Provider/Extender:Husain Costabile, [name], [name] in Treatment: 36 Constitutional Patient is hypertensive.. Pulse regular and within target range for patient.[name] Respirations regular, non-labored and within target range.. Temperature is normal and within the target range for the patient.[name] Appears in no distress. Eyes Conjunctivae clear. No discharge.no icterus. Respiratory work of breathing is normal. Cardiovascular Pedal pulses palpable and strong bilaterally.. Edema control. Integumentary (Hair, Skin) No erythema around the wound. Psychiatric appears at normal baseline. Notes Wound exam; left anterior and right anterior shin both have healthy wound areas. Debrided with Anasept and gauze. Surface of these look quite good. There is no surrounding infection The area on the right posterior calf which was his original wound also 2  or 3 small open areas these look healthy as well Electronic Signature(s) Signed: 04/08/2019 6:06:00 PM By: Linton Ham MD Entered By: Linton Ham on 04/08/2019 16:32:46 -------------------------------------------------------------------------------- Physician Orders Details Patient Name: Date of Service: [name] 04/08/2019 2:30 PM Medical Record [id number] Patient Account Number: [id number] Date of Birth/Sex: Treating RN: 1955-12-24 (63 y.o. [name] Primary Care Provider: [name] Other Clinician: Referring Provider: Treating Provider/Extender:Radley Teston, [name], [name] in Treatment: 36 Verbal / Phone Orders: No Diagnosis Coding ICD-10 Coding Code Description [phone] Non-pressure chronic ulcer of right calf with fat layer exposed E11.622 Type 2 diabetes mellitus with other skin ulcer I87.321 Chronic venous hypertension (  idiopathic) with inflammation of right lower extremity S81.811D Laceration without foreign body, right lower leg, subsequent encounter S81.812D Laceration without foreign body, left lower leg, subsequent encounter L03.115 Cellulitis of right lower limb Follow-up Appointments Return Appointment in 1 week. - Thursday AM Dressing Change Frequency Do not change entire dressing for one week. - both lower legs. Skin Barriers/Peri-Wound Care Moisturizing lotion - both legs Wound Cleansing May shower with protection. Primary Wound Dressing Wound #6 Right,Proximal,Posterior Lower Leg Hydrofera Blue Wound #7 Right,Anterior Lower Leg Hydrofera Blue Wound #8 Left,Anterior Lower Leg Hydrofera Blue Secondary Dressing Wound #6 Right,Proximal,Posterior Lower Leg ABD pad Kerramax - or zetvuit Wound #7 Right,Anterior Lower Leg ABD pad Kerramax - or zetvuit Wound #8 Left,Anterior Lower Leg ABD pad Kerramax - or zetvuit Edema Control 4 layer compression - Bilateral Avoid standing for long periods of  time Elevate legs to the level of the heart or above for 30 minutes daily and/or when sitting, a frequency of: - 3-4 times a day. Electronic Signature(s) Signed: 04/08/2019 5:59:58 PM By: Levan Hurst RN, BSN Signed: 04/08/2019 6:06:00 PM By: Linton Ham MD Entered By: Levan Hurst on 04/08/2019 15:56:57 -------------------------------------------------------------------------------- Problem List Details Patient Name: Date of Service: JENNINGS, MAKRIS 04/08/2019 2:30 PM Medical Record [id number] Patient Account Number: [id number] Date of Birth/Sex: Treating RN: 1955-09-12 (63 y.o. [name] Primary Care Provider: [name] Other Clinician: Referring Provider: Treating Provider/Extender:Virgil Lightner, [name], [name] in Treatment: 36 Active Problems ICD-10 Evaluated Encounter Code Description Active Date Today Diagnosis L97.212 Non-pressure chronic ulcer of right calf with fat layer 07/29/2018 No Yes exposed E11.622 Type 2 diabetes mellitus with other skin ulcer 07/29/2018 No Yes I87.321 Chronic venous hypertension (idiopathic) with 07/29/2018 No Yes inflammation of right lower extremity S81.811D Laceration without foreign body, right lower leg, 02/14/2019 No Yes subsequent encounter S81.812D Laceration without foreign body, left lower leg, 02/14/2019 No Yes subsequent encounter L03.115 Cellulitis of right lower limb 09/24/2018 No Yes Inactive Problems Resolved Problems Electronic Signature(s) Signed: 04/08/2019 6:06:00 PM By: Linton Ham MD Entered By: Linton Ham on 04/08/2019 16:28:55 -------------------------------------------------------------------------------- Progress Note Details Patient Name: Date of Service: [name] 04/08/2019 2:30 PM Medical Record [id number] Patient Account Number: [id number] Date of Birth/Sex: Treating RN: 09-09-1955 (63 y.o. [name] Primary Care Provider: [name] Other Clinician: Referring Provider: Treating Provider/Extender:Audryana Hockenberry, [name], [name] in Treatment: 36 Subjective History of Present Illness (HPI) ADMISSION 07/29/2018 Mr. Gerow is a 63 year old man with either prediabetes or diabetes he is on glipizide. In late October to November 2019 he noted a scabbed area on the back of his right calf. He picked this off a few times but it would not heal. He saw his primary physician on 12/5. It was felt at that time that this may actually heal on its own with conservative management. The next visit was on 07/20/2018 noted the area was a lot worse and arranged for his treatment here. He has been topical antibiotics like Neosporin although he stopped using this when the wound looked worse. He is just been covering this with a clean Band-Aid. He is given Bactrim a week ago and he is finishing this currently. It is made some improvement in the surrounding erythema per the patient. He does not have a history of nonhealing wounds. No prior history of wounds on his legs that he had difficulty healing. No prior skin issues. 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 Past medical history; A. fib status post ablation  and recent cardioversion he is on Xarelto. He has a history of systolic heart failure, cervical radiculopathy, cardiomyopathy and type 2 diabetes as discussed ABI in the right leg was noncompressible on the right 2/20; the biopsy I did on the patient last week was negative for malignancy. Culture grew Pseudomonas and methicillin sensitive staph aureus. He is on cefdinir 300 twice a day. I will have to make this a 10 day course. He put him in compression. He states that the redness pain and erythema are a lot better. I suspect the patient has chronic venous insufficiency probably with a secondary cellulitis 2/27; arrives today with copious amounts of drainage coming out of the wound irritating the skin below  the wound area. He only renewed the final 3 days of cefdinir today 3/5; came in for a nurse change 3 days ago. Again a lot of drainage noted. Zinc oxide was applied unfortunately the drainage appears to a pool then he has a string of superficial open areas extending down into the Achilles area and some just below the wound. The wound itself does not look too bad. He is completed the antibiotics although apparently there was separation from the original 7 with the last 3 days. Culture grew a few MSSA and a few Pseudomonas 3/12; not too much difference over the last week. He came in for a dressing change on Monday by our nursing staff. Necrotic debris again over the wound surface. The entire area looks irritated but nontender and I do not think shows obvious evidence of infection. We have not heard anything about the reflux studies. Also notable than in this diabetic man we had noncompressible vessels and although I can feel his pulses easily in his feet I will order arterial studies as well. 3/19-Patient had experience more pain has been dressed with a silver alginate, the wounds all have necrotic debris, very friable with easy bleeding with any kind of surface debridement including with gauze and Anasept and with a #3 curette. His vascular appointments unfortunately were all canceled on account of the virus outbreak resulting in studies only being done for emergent cases. His pulses are easily palpable in the lower extremity. He has open areas below the primary wound where he states the drainage which included purulent material also with some blood pooled and caused breakdown. 3/26; the patient was changed to Santyl with [facility] [employer] backing last week. This was largely because the silver alginate was sticking to the wound. He is still having quite a bit of pain. He tells [place] that the reflux studies and arterial studies we had attempted to arrange through vein and vascular are not being booked  until mid May. Culture that was done last week showed both Serratia moderate and a few methicillin sensitive staph aureus I started him on Bactrim DS 1 p.o. twice daily on 3/23 for 7 days. He is here for follow-up. 4/3; patient is on Santyl with Hydrofera Blue. Patient complains of pain. Our intake nurse is noted drainage and some odor. He has completed Bactrim recently for Serratia and a few methicillin sensitive staph aureus. After our staff push hard last week we were able to get venous studies as well as arterial studies. His arterial studies showed on the right great toe pressure of 0.86. On the left ABI at 1.47 TBI of 0.87. On both sides waveforms were triphasic VENOUS STUDIES showed reflux in the femoral vein, popliteal vein great saphenous vein at the saphenofemoral junction and great saphenous vein at the  proximal thigh. Also noted to have age-indeterminate superficial vein thrombosis involving the small saphenous vein. Notable that the patient is already on Xarelto for atrial fibrillation. 4/9; the patient has been to see vascular surgery and reviewed by Dr. [name]. He was felt to have only minimal superficial venous reflux in the right great greater saphenous vein. For this reason he was not felt to be a candidate for laser ablation of the right greater saphenous vein. A wedge cushion was suggested to keep his leg elevated. He does have deep venous reflux on the right. Culture I did last time showed a combination of Serratia Pseudomonas and methicillin sensitive staph aureus. We have been using Hydrofera Blue to the large wound, Santyl to some of the deep punched out satellite lesions distal to it. There is some improvement [id number]; the application for Apligraf was put out for further review for material that we have resubmitted. He has the deep area on the right posterior calf which is large and then 3 small punched out areas beneath this. In general his wounds look a lot better 4/23; the  patient has his large original wound and then 2 punched out satellite lesions below it on the right posterior calf. I was usually able to use Apligraf #1 to cover the full surface area of the larger wound. We continued with Santyl and Hydrofera Blue on the 2 punched-out areas 5/7; patient has his large original wound and 2 punched out satellite lesions below it in the right posterior calf. Apligraf #2 today. Major improvement in the big wound in terms of wound depth. Punched-out Warren wounds have a very healthy looking wound bed. 5/21-Patient comes back for his large right posterior calf wound for which she has been an Apligraf #3 today. Wound depth seems to be better, the punched-out wounds appear to have healthy bed with some bleeding 6/4; right posterior calf wound is much better. Apligraf #4 today. The major wound has no wound depth at all. Only 1 of the satellite lesions is still open. The patient has very high blood pressure coming in today with a diastolic blood pressure of 130 after lying there for a few minutes it was down to 110. He states that is running [id number] diastolic at home. He was high last week as well I thought he was on 50 mg 1/2 tablet twice daily of losartan I told him to double up on that however it turns out he is actually on 50 twice daily. Course he is run out of his medications prematurely. He has a follow-up with his primary's office next Wednesday. 6/18; patient's wounds continue to improve. The small area laterally is just about closed and there is continued epithelialization on the area on the posterior calf. 7/2; we applied his last Apligraf last visit. Early the next week there was a lot of purulent looking drainage that I cultured this grew staph and Pseudomonas however when we had him back for the next visit to 3 days later everything looked a lot better. He did not receive systemic antibiotics. Fortunately we did not have to remove the Apligraf. He has had  heart trouble this week he was having an ablation apparently they have discovered a clot in his left atrium they have changed all his anticoagulants as well as his blood pressure medication. 7/16; using Hydrofera Blue. We are making nice progress towards closure. He has not yet ordered his compression stockings he has his measurements 7/23; dimensions are better. He has a new  satellite area superiorly. Both wounds cauterized with silver nitrate. 7/30; absolutely no change in the major wound on the posterior part of the right calf. We have been using Hydrofera Blue for a prolonged period of time and really had a nice improvement but over the last few weeks this is stalled. There is no depth. He arrived in clinic today with a new area on the right anterior tibia which apparently was an "ingrown hair". It is clearly an open area. This looks like a wrap injury although he was not really aware of it. 8/31; since the patient was last in clinic he was admitted to hospital from 8/17 through 8/22. He had had multiple falls at home including one off a ladder. He was admitted to hospital with mild COVID-19 viral pneumonia. Noted to be hyperkalemic and in acute renal failure. He has chronic systolic heart failure with ejection fraction of 35%. He arrives back in clinic with the original wound on the posterior aspect of the right lower calf looking a lot better. He has eschared areas from falls and lacerations on his anterior knees bilaterally just below the patella and on the mid part of his tibia bilaterally. He seems to have a small area on the right lateral ankle as well 9/10- Patient comes to clinic with a 2 new wounds one on the left anterior shin and one on the right posterior leg both the left knee and right knee areas are healed up. We have been using Santyl to the left anterior leg and silver alginate to right posterior leg wounds. 9/17; the patient is original wound looks good on the posterior right  calf. He has traumatic areas on the left anterior shin left anterior patellar tendon and the right mid tibia area. Except for the right posterior calf all required debridement. We have been using Santyl on these areas and silver alginate posteriorly right calf 9/24; the original wound on the posterior calf on the right looks good. This is healthy. There are traumatic wounds in the left anterior shin, left anterior patella in the right mid tibia area are about the same. He has dusky erythema on the left anterior tibia which led me to give him antibiotics last week. This does not look too much different. This is nontender and I wonder if this is all just venous inflammation. 10/2; the patient has 2 open areas on the right posterior calf both of these look good we have been using silver alginate. He has traumatic wounds on the left and right mid tibia areas. The surface of these wounds is still not ready for closure. We have been using Iodoflex. Finally has areas on the left knee. The latter wounds are all traumatic after a fall 10/9. His original wounds of the right posterior calf are superficial and progressing towards closure. His traumatic wounds on the left anterior tibia and right anterior tibia are considerably improved in terms of the wound surface. He asked me about a skin graft in these areas I do not think this is going to be necessary. Change from Iodosorb to [facility]. ooThe area on the left knee is just about closed as well 10/16; his original wound on the right posterior calf very superficial looks healthy. He has more recent traumatic wounds on the left anterior tibia and right anterior tibia both of these have better looking surfaces and measuring smaller. Might be beneficial for an advanced treatment product. The area on his left knee has a small superficial scab and  we are going to close that when 10/23; his original wound areas on the right posterior calf continue to  improve. The more recently traumatic wounds on the left anterior tibia and right anterior tibia both look better in terms of surfaces. No debridement required. We have been using Hydrofera Blue. He is approved for Apligraf Objective Constitutional Patient is hypertensive.. Pulse regular and within target range for patient.[name] Respirations regular, non-labored and within target range.. Temperature is normal and within the target range for the patient.[name] Appears in no distress. Vitals Time Taken: 3:00 PM, Height: 73 in, Weight: 290 lbs, BMI: 38.3, Temperature: 98.3 F, Pulse: 60 bpm, Respiratory Rate: 18 breaths/min, Blood Pressure: 164/96 mmHg. Eyes Conjunctivae clear. No discharge.no icterus. Respiratory work of breathing is normal. Cardiovascular Pedal pulses palpable and strong bilaterally.. Edema control. Psychiatric appears at normal baseline. General Notes: Wound exam; left anterior and right anterior shin both have healthy wound areas. Debrided with Anasept and gauze. Surface of these look quite good. There is no surrounding infection ooThe area on the right posterior calf which was his original wound also 2 or 3 small open areas these look healthy as well Integumentary (Hair, Skin) No erythema around the wound. Wound #6 status is Open. Original cause of wound was Gradually Appeared. The wound is located on the Right,Proximal,Posterior Lower Leg. The wound measures 0.5cm length x 0.5cm width x 0.1cm depth; 0.196cm^2 area and 0.02cm^3 volume. There is Fat Layer (Subcutaneous Tissue) Exposed exposed. There is no tunneling or undermining noted. There is a medium amount of serosanguineous drainage noted. The wound margin is flat and intact. There is large (67-100%) red, pink granulation within the wound bed. There is no necrotic tissue within the wound bed. Wound #7 status is Open. Original cause of wound was Trauma. The wound is located on the Right,Anterior Lower Leg. The wound  measures 1.8cm length x 1.1cm width x 0.1cm depth; 1.555cm^2 area and 0.156cm^3 volume. There is Fat Layer (Subcutaneous Tissue) Exposed exposed. There is no tunneling or undermining noted. There is a medium amount of serosanguineous drainage noted. The wound margin is well defined and not attached to the wound base. There is large (67-100%) red, pink granulation within the wound bed. There is a small (1-33%) amount of necrotic tissue within the wound bed including Adherent Slough. Wound #8 status is Open. Original cause of wound was Gradually Appeared. The wound is located on the Left,Anterior Lower Leg. The wound measures 6cm length x 2.7cm width x 0.1cm depth; 12.723cm^2 area and 1.272cm^3 volume. There is Fat Layer (Subcutaneous Tissue) Exposed exposed. There is no tunneling or undermining noted. There is a medium amount of serosanguineous drainage noted. The wound margin is distinct with the outline attached to the wound base. There is medium (34-66%) red granulation within the wound bed. There is a small (1-33%) amount of necrotic tissue within the wound bed including Adherent Slough. Assessment Active Problems ICD-10 Non-pressure chronic ulcer of right calf with fat layer exposed Type 2 diabetes mellitus with other skin ulcer Chronic venous hypertension (idiopathic) with inflammation of right lower extremity Laceration without foreign body, right lower leg, subsequent encounter Laceration without foreign body, left lower leg, subsequent encounter Cellulitis of right lower limb Procedures Wound #6 Pre-procedure diagnosis of Wound #6 is a Venous Leg Ulcer located on the Right,Proximal,Posterior Lower Leg . There was a Four Layer Compression Therapy Procedure by Levan Hurst, RN. Post procedure Diagnosis Wound #6: Same as Pre-Procedure Wound #7 Pre-procedure diagnosis of Wound #7 is a  Venous Leg Ulcer located on the Right,Anterior Lower Leg . There was a Four Layer Compression  Therapy Procedure by Levan Hurst, RN. Post procedure Diagnosis Wound #7: Same as Pre-Procedure Wound #8 Pre-procedure diagnosis of Wound #8 is a Venous Leg Ulcer located on the Left,Anterior Lower Leg . There was a Four Layer Compression Therapy Procedure by Levan Hurst, RN. Post procedure Diagnosis Wound #8: Same as Pre-Procedure Plan Follow-up Appointments: Return Appointment in 1 week. - Thursday AM Dressing Change Frequency: Do not change entire dressing for one week. - both lower legs. Skin Barriers/Peri-Wound Care: Moisturizing lotion - both legs Wound Cleansing: May shower with protection. Primary Wound Dressing: Wound #6 Right,Proximal,Posterior Lower Leg: Hydrofera Blue Wound #7 Right,Anterior Lower Leg: Hydrofera Blue Wound #8 Left,Anterior Lower Leg: Hydrofera Blue Secondary Dressing: Wound #6 Right,Proximal,Posterior Lower Leg: ABD pad Kerramax - or zetvuit Wound #7 Right,Anterior Lower Leg: ABD pad Kerramax - or zetvuit Wound #8 Left,Anterior Lower Leg: ABD pad Kerramax - or zetvuit Edema Control: 4 layer compression - Bilateral Avoid standing for long periods of time Elevate legs to the level of the heart or above for 30 minutes daily and/or when sitting, a frequency of: - 3-4 times a day. 1. Continue Hydrofera Blue 2. Ordered Apligraf #1 for next week 3. Nice progress Engineer, maintenance) Signed: 04/08/2019 6:06:00 PM By: Linton Ham MD Entered By: Linton Ham on 04/08/2019 17:10:31 -------------------------------------------------------------------------------- SuperBill Details Patient Name: Date of Service: MEIKO, MAROHN 04/08/2019 Medical Record [name] Patient Account Number: [id number] Date of Birth/Sex: Treating RN: 1956/06/16 (63 y.o. [name] Primary Care Provider: [name] Other Clinician: Referring Provider: Treating Provider/Extender:[place], [name], [name] in  Treatment: 36 Diagnosis Coding ICD-10 Codes Code Description [phone] Non-pressure chronic ulcer of right calf with fat layer exposed E11.622 Type 2 diabetes mellitus with other skin ulcer I87.321 Chronic venous hypertension (idiopathic) with inflammation of right lower extremity S81.811D Laceration without foreign body, right lower leg, subsequent encounter S81.812D Laceration without foreign body, left lower leg, subsequent encounter L03.115 Cellulitis of right lower limb Facility Procedures CPT4: Code [id number] Description: [id number] BILATERAL: Application of multi-layer venous compression system; leg (below knee), including ankle and foot. Modifier Quantity: 1 Physician Procedures Electronic Signature(s) Signed: 04/08/2019 6:06:00 PM By: Linton Ham MD Entered By: Linton Ham on 04/08/2019 16:34:23

## 2019-04-12 ENCOUNTER — Telehealth: Payer: Self-pay | Admitting: Cardiology

## 2019-04-12 NOTE — Telephone Encounter (Signed)
Patient returning call.

## 2019-04-12 NOTE — Telephone Encounter (Signed)
Pt updated and voiced understanding.  

## 2019-04-13 ENCOUNTER — Encounter: Payer: Self-pay | Admitting: Internal Medicine

## 2019-04-13 ENCOUNTER — Ambulatory Visit: Payer: BLUE CROSS/BLUE SHIELD | Admitting: Internal Medicine

## 2019-04-13 VITALS — BP 130/90 | HR 84 | Temp 97.5°F | Ht 73.0 in | Wt 288.0 lb

## 2019-04-13 DIAGNOSIS — Z7901 Long term (current) use of anticoagulants: Secondary | ICD-10-CM

## 2019-04-13 DIAGNOSIS — Z1159 Encounter for screening for other viral diseases: Secondary | ICD-10-CM | POA: Diagnosis not present

## 2019-04-13 DIAGNOSIS — Z800 Family history of malignant neoplasm of digestive organs: Secondary | ICD-10-CM | POA: Diagnosis not present

## 2019-04-13 DIAGNOSIS — Z86010 Personal history of colonic polyps: Secondary | ICD-10-CM | POA: Diagnosis not present

## 2019-04-13 MED ORDER — NA SULFATE-K SULFATE-MG SULF 17.5-3.13-1.6 GM/177ML PO SOLN: 1.0000 | Freq: Once | ORAL | 0 refills | Status: AC

## 2019-04-13 NOTE — Progress Notes (Signed)
HISTORY OF PRESENT ILLNESS:  [name] is a 63 y.o. male with multiple medical problems including morbid obesity, sleep apnea, hypertension, hyperlipidemia, congestive heart failure with ejection fraction 50%, diabetes mellitus with lower extremity diabetic ulcers, and paroxysmal atrial fibrillation with prior ablation currently on Pradaxa.  Patient also had COVID-19 infection earlier this year for which she has recovered.  He had no significant symptoms.  In any event, he presents today regarding surveillance colonoscopy.  His brother was diagnosed with colon cancer in his late 37s.  Patient has undergone previous colonoscopy here in 2009 and 2016 with multiple adenomatous colon polyps.  Also severe diverticulosis bilaterally.  His last examination was performed February 2016.  Follow-up in 3 years recommended.  He did come off Pradaxa prior.  He sees Dr. Rayann Heman for his heart.  He denies any active GI symptoms such as abdominal pain, change in bowel habits, or rectal bleeding.  His other cardiopulmonary issues are stable.  Review of x-ray study shows recent gated nuclear exam with ejection fraction 49%.  Review of blood work from March 08, 2019 shows hemoglobin 14.2.  GFR 47.  REVIEW OF SYSTEMS:  All non-GI ROS negative unless otherwise stated in the HPI except for lower extremity ulcers  Past Medical History:  Diagnosis Date  . Allergy    SEASONAL  . Asthma   . Atrial fibrillation ([place])    persistant 02/2009  . Atrial flutter ([place])    s/p CTI ablation 08/06/10  . Cardiac arrhythmia due to congenital heart disease   . Cataract    BEGINING  . Chronic rhinitis   . Chronic systolic dysfunction of left ventricle   . Colon polyp   . Congestive heart failure ([place])   . Diverticulosis    colonoscopy 04/03/2009  . Hyperlipidemia   . Hypertension   . Hypertension   . Morbid obesity ([place])    target weight = 219  for BMI < 30  . Nonischemic cardiomyopathy ([facility])    tachycardia mediated   . Sleep apnea    original 2001    Past Surgical History:  Procedure Laterality Date  . ATRIAL FIBRILLATION ABLATION N/A 12/14/2018   Procedure: ATRIAL FIBRILLATION ABLATION;  Surgeon: Thompson Grayer, MD;  Location: [place] [facility];  Service: Cardiovascular;  Laterality: N/A;  . atrial flutter ablation  08/06/10  . CARDIOVERSION N/A 07/05/2018   Procedure: CARDIOVERSION;  Surgeon: Buford Dresser, MD;  Location: [facility];  Service: Cardiovascular;  Laterality: N/A;  . COLONOSCOPY    . TEE WITHOUT CARDIOVERSION N/A 12/14/2018   Procedure: TRANSESOPHAGEAL ECHOCARDIOGRAM (TEE);  Surgeon: Thompson Grayer, MD;  Location: [place] [facility];  Service: Cardiovascular;  Laterality: N/A;    Social History [name]  reports that he has never smoked. He has never used smokeless tobacco. He reports current alcohol use. He reports that he does not use drugs.  family history includes COPD in his father; Colon cancer in his brother; Colon cancer (age of onset: 88) in his father; Hypertension in his mother; Kidney failure in his mother.  No Known Allergies     PHYSICAL EXAMINATION: Vital signs: BP 130/90   Pulse 84   Temp (!) 97.5 F (36.4 C)   Ht 6\' 1"  (1.854 m)   Wt 288 lb (130.6 kg)   BMI 38.00 kg/m   Constitutional: Obese, somewhat unhealthy appearing, no acute distress Psychiatric: alert and oriented x3, cooperative Eyes: extraocular movements intact, anicteric, conjunctiva pink Mouth: oral pharynx moist, no lesions Neck:  supple no lymphadenopathy Cardiovascular: heart regular rate and rhythm, no murmur Lungs: clear to auscultation bilaterally Abdomen: soft, nontender, nondistended, no obvious ascites, no peritoneal signs, normal bowel sounds, no organomegaly Rectal: Deferred until colonoscopy Extremities: no clubbing or cyanosis.  Probable lower extremity edema bilaterally, the legs are wrapped from the ankles to the knees with medical bandages Skin: no lesions on  visible extremities (bandages not removed) Neuro: No focal deficits.  Cranial nerves intact  ASSESSMENT:  1.  Personal history of multiple adenomatous colon polyps.  Overdue for surveillance.  Previous examinations 2009, 2016 2.  Family history of colon cancer in his brother less than age 60 3.  Multiple medical problems including atrial fibrillation on Pradaxa, diabetes mellitus and morbid obesity with sleep apnea   PLAN:  1.  Schedule surveillance colonoscopy.  The patient is HIGH RISK given his body habitus and comorbidities.  Also the need to address his chronic anticoagulation therapy and address diabetic medications.  He will need his examination performed with monitored anesthesia care.The nature of the procedure, as well as the risks, benefits, and alternatives were carefully and thoroughly reviewed with the patient. Ample time for discussion and questions allowed. The patient understood, was satisfied, and agreed to proceed. 2.  Hold Pradaxa 3 days prior to his procedure reduce the risk of procedure associated bleeding.  He held this before with the approval of his cardiologist.  We will check again to be certain that this is reasonable.  I would anticipate resumption of Pradaxa immediately post procedure. 3.  Hold diabetic medications the day of the examination to avoid unwanted hypoglycemia A copy of this note has been sent to Dr. Rayann Heman

## 2019-04-13 NOTE — Patient Instructions (Signed)
You have been scheduled for a colonoscopy. Please follow written instructions given to you at your visit today.  Please pick up your prep supplies at the pharmacy within the next 1-3 days. If you use inhalers (even only as needed), please bring them with you on the day of your procedure.   

## 2019-04-14 ENCOUNTER — Other Ambulatory Visit: Payer: Self-pay

## 2019-04-14 ENCOUNTER — Telehealth: Payer: Self-pay

## 2019-04-14 ENCOUNTER — Encounter (HOSPITAL_BASED_OUTPATIENT_CLINIC_OR_DEPARTMENT_OTHER): Payer: BLUE CROSS/BLUE SHIELD | Admitting: Internal Medicine

## 2019-04-14 DIAGNOSIS — E11622 Type 2 diabetes mellitus with other skin ulcer: Secondary | ICD-10-CM | POA: Diagnosis not present

## 2019-04-14 DIAGNOSIS — L97822 Non-pressure chronic ulcer of other part of left lower leg with fat layer exposed: Secondary | ICD-10-CM | POA: Diagnosis not present

## 2019-04-14 DIAGNOSIS — L97812 Non-pressure chronic ulcer of other part of right lower leg with fat layer exposed: Secondary | ICD-10-CM | POA: Diagnosis not present

## 2019-04-14 DIAGNOSIS — I87321 Chronic venous hypertension (idiopathic) with inflammation of right lower extremity: Secondary | ICD-10-CM | POA: Diagnosis not present

## 2019-04-14 DIAGNOSIS — G4730 Sleep apnea, unspecified: Secondary | ICD-10-CM | POA: Diagnosis not present

## 2019-04-14 DIAGNOSIS — I110 Hypertensive heart disease with heart failure: Secondary | ICD-10-CM | POA: Diagnosis not present

## 2019-04-14 DIAGNOSIS — L97212 Non-pressure chronic ulcer of right calf with fat layer exposed: Secondary | ICD-10-CM | POA: Diagnosis not present

## 2019-04-14 DIAGNOSIS — I509 Heart failure, unspecified: Secondary | ICD-10-CM | POA: Diagnosis not present

## 2019-04-14 NOTE — Telephone Encounter (Signed)
Clinical pharmacist to review 

## 2019-04-14 NOTE — Telephone Encounter (Signed)
[facility] Pre-operative Risk Assessment     Request for surgical clearance:     Endoscopy Procedure  What type of surgery is being performed?     Colonoscopy  When is this surgery scheduled?     05/10/2019  What type of clearance is required ?   Pharmacy  Are there any medications that need to be held prior to surgery and how long? Pradaxa - 3 days  Practice name and name of physician performing surgery?      [facility]  What is your office phone and fax number?      Phone- [phone]  [phone]  Anesthesia type (None, local, MAC, general) ?       MAC

## 2019-04-15 NOTE — Telephone Encounter (Signed)
Patient with diagnosis of afib on Pradaxa for anticoagulation.    Procedure: Colonoscopy Date of procedure: 05/10/2019  CHADS2-VASc score of  3 (CHF, HTN, DM2)  CrCl 70 ml/min  Per office protocol, patient can hold Pradaxa for 3 days prior to procedure.

## 2019-04-18 NOTE — Progress Notes (Signed)
[name] ([name]) Visit Report for 04/14/2019 Arrival Information Details Patient Name: Date of Service: [name], [name] 04/14/2019 11:00 AM Medical Record [name] Patient Account Number: [id number] Date of Birth/Sex: Treating RN: 1955/08/12 (63 y.o. [name] Primary Care Cheryl Stabenow: [name] Other Clinician: Referring Josefine Fuhr: Treating Foster Sonnier/Extender:Robson, [name], [name] in Treatment: 35 Visit Information History Since Last Visit Added or deleted any medications: No Patient Arrived: Ambulatory Any new allergies or adverse reactions: No Arrival Time: 11:35 Had a fall or experienced change in No Accompanied By: alone activities of daily living that may affect Transfer Assistance: None risk of falls: Patient Identification Verified: Yes Signs or symptoms of abuse/neglect since last No Secondary Verification Process Yes visito Completed: Hospitalized since last visit: No Patient Requires Transmission- No Implantable device outside of the clinic excluding No Based Precautions: cellular tissue based products placed in the center Patient Has Alerts: Yes Patient on Blood since last visit: Patient Alerts: Has Dressing in Place as Prescribed: Yes Thinner Has Compression in Place as Prescribed: Yes xarelto R TBI = .86, L TBI Pain Present Now: Yes = .87 Electronic Signature(s) Signed: 04/18/2019 6:00:40 PM By: Levan Hurst RN, BSN Entered By: Levan Hurst on 04/14/2019 11:36:12 -------------------------------------------------------------------------------- Compression Therapy Details Patient Name: Date of Service: [name] 04/14/2019 11:00 AM Medical Record [id number] Patient Account Number: [id number] Date of Birth/Sex: Treating RN: 11-02-1955 (63 y.o. [name] Primary Care Afiya Ferrebee: [name] Other Clinician: Referring Del Overfelt: Treating Jerie Basford/Extender:Robson,  [name], [name] in Treatment: 37 Compression Therapy Performed for Wound Wound #6 Right,Proximal,Posterior Lower Leg Assessment: Performed By: Clinician Levan Hurst, RN Compression Type: Four Layer Electronic Signature(s) Signed: 04/18/2019 6:00:40 PM By: Levan Hurst RN, BSN Entered By: Levan Hurst on 04/14/2019 12:21:50 -------------------------------------------------------------------------------- Compression Therapy Details Patient Name: Date of Service: [name] 04/14/2019 11:00 AM Medical Record [id number] Patient Account Number: [id number] Date of Birth/Sex: Treating RN: 11-Dec-1955 (63 y.o. [name] Primary Care Laria Grimmett: [name] Other Clinician: Referring Shakela Donati: Treating Geraldy Akridge/Extender:Robson, [name], [name] in Treatment: 37 Compression Therapy Performed for Wound Wound #7 Right,Anterior Lower Leg Assessment: Performed By: Clinician Levan Hurst, RN Compression Type: Four Layer Electronic Signature(s) Signed: 04/18/2019 6:00:40 PM By: Levan Hurst RN, BSN Entered By: Levan Hurst on 04/14/2019 12:21:50 -------------------------------------------------------------------------------- Compression Therapy Details Patient Name: Date of Service: [name] 04/14/2019 11:00 AM Medical Record [id number] Patient Account Number: [id number] Date of Birth/Sex: Treating RN: 11-26-1955 (63 y.o. [name] Primary Care Aniqa Hare: [name] Other Clinician: Referring Anusha Claus: Treating Mcguire Gasparyan/Extender:Robson, [name], [name] in Treatment: 37 Compression Therapy Performed for Wound Wound #8 Left,Anterior Lower Leg Assessment: Performed By: Clinician Levan Hurst, RN Compression Type: Four Layer Electronic Signature(s) Signed: 04/18/2019 6:00:40 PM By: Levan Hurst RN, BSN Entered By: Levan Hurst on 04/14/2019  12:21:50 -------------------------------------------------------------------------------- Encounter Discharge Information Details Patient Name: Date of Service: [name]. 04/14/2019 11:00 AM Medical Record [id number] Patient Account Number: [id number] Date of Birth/Sex: Treating RN: [birth year] (63 y.o. [name] Primary Care Lynzi Meulemans: [name] Other Clinician: Referring Elwyn Lowden: Treating Keyli Duross/Extender:Robson, [name], [name] in Treatment: 37 Encounter Discharge Information Items Discharge Condition: Stable Ambulatory Status: Ambulatory Discharge Destination: Home Transportation: Private Auto Accompanied By: alone Schedule Follow-up Appointment: Yes Clinical Summary of Care: Patient Declined Electronic Signature(s) Signed: 04/18/2019 6:00:40 PM By: Levan Hurst RN, BSN Entered By: Levan Hurst on 04/14/2019 12:22:42 -------------------------------------------------------------------------------- Patient/Caregiver Education Details Patient Name: [name] 10/29/2020andnbsp11:00 Date of Service: AM Medical Record [name] Number: Patient  Account Number: [id number] Treating RN: Date of Birth/Gender: 05/02/1956 (63 y.o. [name]) Other Clinician: Primary Care Treating Jacelyn Pi Physician: Physician/Extender: Referring Physician: Elicia [name] in Treatment: 76 Education Assessment Education Provided To: Patient Education Topics Provided Wound/Skin Impairment: Methods: Explain/Verbal Responses: State content correctly [employer]) Signed: 04/18/2019 6:00:40 PM By: Levan Hurst RN, BSN Entered By: Levan Hurst on 04/14/2019 12:22:34 -------------------------------------------------------------------------------- Wound Assessment Details Patient Name: Date of Service: [name] 04/14/2019 11:00 AM Medical Record [id number] Patient  Account Number: [id number] Date of Birth/Sex: Treating RN: 02-Jan-1956 (63 y.o. [name] Primary Care Avianna Moynahan: [name] Other Clinician: Referring Hazem Kenner: Treating Rosaline Ezekiel/Extender:Robson, [name], [name] in Treatment: 37 Wound Status Wound Number: 6 Primary Venous Leg Ulcer Etiology: Wound Location: Right Lower Leg - Posterior, Proximal Wound Open Status: Wounding Event: Gradually Appeared Comorbid Asthma, Sleep Apnea, Arrhythmia, Date Acquired: 01/06/2019 History: Congestive Heart Failure, Hypertension, Type Weeks Of Treatment: 14 II Diabetes Clustered Wound: No Wound Measurements Length: (cm) 0.5 % Reduc Width: (cm) 0.5 % Reduc Depth: (cm) 0.1 Epithel Area: (cm) 0.196 Tunnel Volume: (cm) 0.02 Underm Wound Description Classification: Full Thickness Without Exposed Support Structures Wound Flat and Intact Margin: Exudate Medium Amount: Exudate Serosanguineous Type: Exudate red, brown Color: Wound Bed Granulation Amount: Large (67-100%) Granulation Quality: Red Necrotic Amount: None Present (0%) Foul Odor After Cleansing: No Slough/Fibrino No Exposed Structure Fascia Exposed: No Fat Layer (Subcutaneous Tissue) Exposed: Yes Tendon Exposed: No Muscle Exposed: No Joint Exposed: No Bone Exposed: No tion in Area: 50.1% tion in Volume: 48.7% ialization: Large (67-100%) ing: No ining: No Treatment Notes Wound #6 (Right, Proximal, Posterior Lower Leg) 1. Cleanse With Soap and water 2. Periwound Care Moisturizing lotion 3. Primary Dressing Applied Hydrofera Blue 4. Secondary Dressing ABD Pad Kerramax/Xtrasorb 6. Support Layer Applied 4 layer compression Water quality scientist) Signed: 04/18/2019 6:00:40 PM By: Levan Hurst RN, BSN Entered By: Levan Hurst on 04/14/2019 12:21:06 -------------------------------------------------------------------------------- Wound Assessment Details Patient  Name: Date of Service: MERRIL, MCCUSKER 04/14/2019 11:00 AM Medical Record [id number] Patient Account Number: [id number] Date of Birth/Sex: Treating RN: 09-06-1955 (63 y.o. [name] Primary Care Latonya Knight: [name] Other Clinician: Referring Jamarious Febo: Treating Arianni Gallego/Extender:Robson, [name], [name] in Treatment: 37 Wound Status Wound Number: 7 Primary Venous Leg Ulcer Etiology: Wound Location: Right Lower Leg - Anterior Wound Open Wounding Event: Trauma Status: Date Acquired: 01/13/2019 Comorbid Asthma, Sleep Apnea, Arrhythmia, Weeks Of Treatment: 13 History: Congestive Heart Failure, Hypertension, Type Clustered Wound: No II Diabetes Wound Measurements Length: (cm) 1.8 % Reduc Width: (cm) 1.1 % Reduc Depth: (cm) 0.1 Epithel Area: (cm) 1.555 Tunnel Volume: (cm) 0.156 Underm Wound Description Classification: Full Thickness Without Exposed Support Ness Wound Well defined, not attached Margin: Exudate Medium Amount: Exudate Serosanguineous Type: Exudate red, brown Color: Wound Bed Granulation Amount: Large (67-100%) Granulation Quality: Red, Pink Fascia Necrotic Amount: Small (1-33%) Fat Lay Necrotic Quality: Adherent Slough Tendon Muscle Joint E Bone Ex dor After Cleansing: No /Fibrino Yes Exposed Structure Exposed: No er (Subcutaneous Tissue) Exposed: Yes Exposed: No Exposed: No xposed: No posed: No tion in Area: -693.4% tion in Volume: -680% ialization: Small (1-33%) ing: No ining: No Treatment Notes Wound #7 (Right, Anterior Lower Leg) 1. Cleanse With Soap and water 2. Periwound Care Moisturizing lotion 3. Primary Dressing Applied Hydrofera Blue 4. Secondary Dressing ABD Pad Kerramax/Xtrasorb 6. Support Layer Applied 4 layer compression Water quality scientist) Signed: 04/18/2019 6:00:40 PM By: Levan Hurst RN, BSN Entered By:  Levan Hurst on 04/14/2019  12:21:25 -------------------------------------------------------------------------------- Wound Assessment Details Patient Name: Date of Service: IAGO, [place] 04/14/2019 11:00 AM Medical Record [name] Patient Account Number: [id number] Date of Birth/Sex: Treating RN: 1956-03-13 (63 y.o. [name] Primary Care Jamilex Bohnsack: [name] Other Clinician: Referring Tiburcio Linder: Treating Zyra Parrillo/Extender:Robson, [name], [name] in Treatment: 37 Wound Status Wound Number: 8 Primary Venous Leg Ulcer Etiology: Wound Location: Left Lower Leg - Anterior Wound Open Wounding Event: Gradually Appeared Status: Date Acquired: 02/14/2019 Comorbid Asthma, Sleep Apnea, Arrhythmia, Weeks Of Treatment: 8 History: Congestive Heart Failure, Hypertension, Type Clustered Wound: No II Diabetes Wound Measurements Length: (cm) 6 % Reduc Width: (cm) 2.7 % Reduc Depth: (cm) 0.1 Epithel Area: (cm) 12.723 Tunnel Volume: (cm) 1.272 Underm Wound Description Full Thickness Without Exposed Support Foul O Classification: Structures Classification: Structures Slough/F Wound Distinct, outline attached Margin: Exudate Medium Amount: Exudate Serosanguineous Type: Exudate red, brown Color: Wound Bed Granulation Amount: Large (67-100%) Granulation Quality: Red Fascia Ex Necrotic Amount: Small (1-33%) Fat Layer Necrotic Quality: Adherent Slough Tendon Ex Muscle Ex Joint Exp Bone Expo dor After Cleansing: No ibrino Yes Exposed Structure posed: No (Subcutaneous Tissue) Exposed: Yes posed: No posed: No osed: No sed: No tion in Area: -102.5% tion in Volume: -102.5% ialization: Small (1-33%) ing: No ining: No Treatment Notes Wound #8 (Left, Anterior Lower Leg) 1. Cleanse With Soap and water 2. Periwound Care Moisturizing lotion 3. Primary Dressing Applied Hydrofera Blue 4. Secondary Dressing ABD Pad Kerramax/Xtrasorb 6. Support Layer  Applied 4 layer compression Water quality scientist) Signed: 04/18/2019 6:00:40 PM By: Levan Hurst RN, BSN Entered By: Levan Hurst on 04/14/2019 12:21:37 -------------------------------------------------------------------------------- [place] Details Patient Name: Date of Service: [name]. 04/14/2019 11:00 AM Medical Record [id number] Patient Account Number: [id number] Date of Birth/Sex: Treating RN: 10-May-1956 (63 y.o. [name] Primary Care Tayley Mudrick: [name] Other Clinician: Referring Noelene Gang: Treating Myrene Bougher/Extender:Robson, [name], [name] in Treatment: 37 Vital Signs Time Taken: 11:37 Temperature (F): 98.3 Height (in): 73 Pulse (bpm): 59 Weight (lbs): 290 Respiratory Rate (breaths/min): 18 Body Mass Index (BMI): 38.3 Blood Pressure (mmHg): 145/83 Reference Range: 80 - 120 mg / dl Electronic Signature(s) Signed: 04/18/2019 6:00:40 PM By: Levan Hurst RN, BSN Entered By: Levan Hurst on 04/14/2019 11:38:17

## 2019-04-18 NOTE — Progress Notes (Signed)
FEMI, WEBB ([name]) Visit Report for 04/14/2019 SuperBill Details Patient Name: Date of Service: [name], [name] 04/14/2019 Medical Record [name] Patient Account Number: [id number] Date of Birth/Sex: Treating RN: May 21, 1956 (63 y.o. [name] Primary Care Provider: [name] Other Clinician: Referring Provider: Treating Provider/Extender:Robson, [name], [name] in Treatment: 37 Diagnosis Coding ICD-10 Codes Code Description [phone] Non-pressure chronic ulcer of right calf with fat layer exposed E11.622 Type 2 diabetes mellitus with other skin ulcer I87.321 Chronic venous hypertension (idiopathic) with inflammation of right lower extremity S81.811D Laceration without foreign body, right lower leg, subsequent encounter S81.812D Laceration without foreign body, left lower leg, subsequent encounter L03.115 Cellulitis of right lower limb Facility Procedures CPT4 Description Modifier Quantity Code A999333 BILATERAL: Application of multi-layer venous compression system; leg 1 (below knee), including ankle and foot. Electronic Signature(s) Signed: 04/18/2019 8:24:05 AM By: Linton Ham MD Signed: 04/18/2019 6:00:40 PM By: Levan Hurst RN, BSN Entered By: Levan Hurst on 04/14/2019 12:22:53

## 2019-04-19 ENCOUNTER — Other Ambulatory Visit: Payer: Self-pay

## 2019-04-19 ENCOUNTER — Encounter (HOSPITAL_BASED_OUTPATIENT_CLINIC_OR_DEPARTMENT_OTHER): Payer: BLUE CROSS/BLUE SHIELD | Attending: Internal Medicine | Admitting: Internal Medicine

## 2019-04-19 DIAGNOSIS — L97812 Non-pressure chronic ulcer of other part of right lower leg with fat layer exposed: Secondary | ICD-10-CM | POA: Diagnosis not present

## 2019-04-19 DIAGNOSIS — Z8619 Personal history of other infectious and parasitic diseases: Secondary | ICD-10-CM | POA: Insufficient documentation

## 2019-04-19 DIAGNOSIS — X58XXXA Exposure to other specified factors, initial encounter: Secondary | ICD-10-CM | POA: Insufficient documentation

## 2019-04-19 DIAGNOSIS — I5022 Chronic systolic (congestive) heart failure: Secondary | ICD-10-CM | POA: Diagnosis not present

## 2019-04-19 DIAGNOSIS — L03115 Cellulitis of right lower limb: Secondary | ICD-10-CM | POA: Diagnosis not present

## 2019-04-19 DIAGNOSIS — L97822 Non-pressure chronic ulcer of other part of left lower leg with fat layer exposed: Secondary | ICD-10-CM | POA: Diagnosis not present

## 2019-04-19 DIAGNOSIS — I872 Venous insufficiency (chronic) (peripheral): Secondary | ICD-10-CM | POA: Insufficient documentation

## 2019-04-19 DIAGNOSIS — I429 Cardiomyopathy, unspecified: Secondary | ICD-10-CM | POA: Insufficient documentation

## 2019-04-19 DIAGNOSIS — S81811A Laceration without foreign body, right lower leg, initial encounter: Secondary | ICD-10-CM | POA: Insufficient documentation

## 2019-04-19 DIAGNOSIS — Z7901 Long term (current) use of anticoagulants: Secondary | ICD-10-CM | POA: Diagnosis not present

## 2019-04-19 DIAGNOSIS — G4730 Sleep apnea, unspecified: Secondary | ICD-10-CM | POA: Diagnosis not present

## 2019-04-19 DIAGNOSIS — L97212 Non-pressure chronic ulcer of right calf with fat layer exposed: Secondary | ICD-10-CM | POA: Diagnosis not present

## 2019-04-19 DIAGNOSIS — I4891 Unspecified atrial fibrillation: Secondary | ICD-10-CM | POA: Insufficient documentation

## 2019-04-19 DIAGNOSIS — I87313 Chronic venous hypertension (idiopathic) with ulcer of bilateral lower extremity: Secondary | ICD-10-CM | POA: Diagnosis not present

## 2019-04-19 DIAGNOSIS — M5412 Radiculopathy, cervical region: Secondary | ICD-10-CM | POA: Diagnosis not present

## 2019-04-19 DIAGNOSIS — I110 Hypertensive heart disease with heart failure: Secondary | ICD-10-CM | POA: Insufficient documentation

## 2019-04-19 DIAGNOSIS — J45909 Unspecified asthma, uncomplicated: Secondary | ICD-10-CM | POA: Insufficient documentation

## 2019-04-19 DIAGNOSIS — E11622 Type 2 diabetes mellitus with other skin ulcer: Secondary | ICD-10-CM | POA: Diagnosis not present

## 2019-04-21 ENCOUNTER — Telehealth: Payer: Self-pay

## 2019-04-21 NOTE — Telephone Encounter (Signed)
Left message on vm that patient could hold Pradaxa for 3 days prior to procedure.  Asked him to call back and leave a message that he received this information.

## 2019-04-24 ENCOUNTER — Other Ambulatory Visit: Payer: Self-pay | Admitting: Internal Medicine

## 2019-04-28 ENCOUNTER — Encounter (HOSPITAL_BASED_OUTPATIENT_CLINIC_OR_DEPARTMENT_OTHER): Payer: BLUE CROSS/BLUE SHIELD | Admitting: Internal Medicine

## 2019-04-28 ENCOUNTER — Other Ambulatory Visit: Payer: Self-pay

## 2019-04-28 DIAGNOSIS — J45909 Unspecified asthma, uncomplicated: Secondary | ICD-10-CM | POA: Diagnosis not present

## 2019-04-28 DIAGNOSIS — G4730 Sleep apnea, unspecified: Secondary | ICD-10-CM | POA: Diagnosis not present

## 2019-04-28 DIAGNOSIS — I110 Hypertensive heart disease with heart failure: Secondary | ICD-10-CM | POA: Diagnosis not present

## 2019-04-28 DIAGNOSIS — L97212 Non-pressure chronic ulcer of right calf with fat layer exposed: Secondary | ICD-10-CM | POA: Diagnosis not present

## 2019-04-28 DIAGNOSIS — Z7901 Long term (current) use of anticoagulants: Secondary | ICD-10-CM | POA: Diagnosis not present

## 2019-04-28 DIAGNOSIS — M5412 Radiculopathy, cervical region: Secondary | ICD-10-CM | POA: Diagnosis not present

## 2019-04-28 DIAGNOSIS — I872 Venous insufficiency (chronic) (peripheral): Secondary | ICD-10-CM | POA: Diagnosis not present

## 2019-04-28 DIAGNOSIS — I429 Cardiomyopathy, unspecified: Secondary | ICD-10-CM | POA: Diagnosis not present

## 2019-04-28 DIAGNOSIS — S81811A Laceration without foreign body, right lower leg, initial encounter: Secondary | ICD-10-CM | POA: Diagnosis not present

## 2019-04-28 DIAGNOSIS — X58XXXA Exposure to other specified factors, initial encounter: Secondary | ICD-10-CM | POA: Diagnosis not present

## 2019-04-28 DIAGNOSIS — Z8619 Personal history of other infectious and parasitic diseases: Secondary | ICD-10-CM | POA: Diagnosis not present

## 2019-04-28 DIAGNOSIS — L03115 Cellulitis of right lower limb: Secondary | ICD-10-CM | POA: Diagnosis not present

## 2019-04-28 DIAGNOSIS — E11622 Type 2 diabetes mellitus with other skin ulcer: Secondary | ICD-10-CM | POA: Diagnosis not present

## 2019-04-28 DIAGNOSIS — I5022 Chronic systolic (congestive) heart failure: Secondary | ICD-10-CM | POA: Diagnosis not present

## 2019-04-28 DIAGNOSIS — I4891 Unspecified atrial fibrillation: Secondary | ICD-10-CM | POA: Diagnosis not present

## 2019-04-28 NOTE — Telephone Encounter (Signed)
Pt has been notified and aware. He states clear understanding to hold his Pradaxa before his colonoscopy.

## 2019-04-29 NOTE — Progress Notes (Signed)
[name], [name] ([name]) Visit Report for 04/28/2019 SuperBill Details Patient Name: Date of Service: [name], [name] 04/28/2019 Medical Record [name] Patient Account Number: [id number] Date of Birth/Sex: Treating RN: [birth year] (63 y.o. [name] Primary Care Provider: [name] Other Clinician: Referring Provider: Treating Provider/Extender:Robson, [name], [name] in Treatment: 39 Diagnosis Coding ICD-10 Codes Code Description [phone] Non-pressure chronic ulcer of right calf with fat layer exposed E11.622 Type 2 diabetes mellitus with other skin ulcer I87.321 Chronic venous hypertension (idiopathic) with inflammation of right lower extremity S81.811D Laceration without foreign body, right lower leg, subsequent encounter S81.812D Laceration without foreign body, left lower leg, subsequent encounter L03.115 Cellulitis of right lower limb Facility Procedures CPT4 Description Modifier Quantity Code [id number] BILATERAL: Application of multi-layer venous compression system; leg 1 (below knee), including ankle and foot. Electronic Signature(s) Signed: 04/28/2019 5:47:05 PM By: Linton Ham MD Signed: 04/29/2019 5:20:59 PM By: Kela Millin Entered By: Kela Millin on 04/28/2019 13:40:27

## 2019-04-29 NOTE — Progress Notes (Signed)
ERINEO, MOHL ([name]) Visit Report for 04/28/2019 Arrival Information Details Patient Name: Date of Service: [name], [name] 04/28/2019 1:00 PM Medical Record [name] Patient Account Number: [id number] Date of Birth/Sex: Treating RN: 08/18/1955 (63 y.o. [name] Primary Care Nason Conradt: [name] Other Clinician: Referring Giovanni Bath: Treating Mata Rowen/Extender:Robson, [name], [name] in Treatment: 39 Visit Information History Since Last Visit Added or deleted any medications: No Patient Arrived: Ambulatory Any new allergies or adverse reactions: No Arrival Time: 13:00 Had a fall or experienced change in No Accompanied By: self activities of daily living that may affect Transfer Assistance: None risk of falls: Patient Identification Verified: Yes Signs or symptoms of abuse/neglect since last No Secondary Verification Process Yes visito Completed: Hospitalized since last visit: No Patient Requires Transmission- No Implantable device outside of the clinic excluding No Based Precautions: cellular tissue based products placed in the center Patient Has Alerts: Yes Patient on Blood since last visit: Patient Alerts: Has Dressing in Place as Prescribed: Yes Thinner Has Compression in Place as Prescribed: Yes xarelto R TBI = .86, L TBI Pain Present Now: No = .87 Electronic Signature(s) Signed: 04/29/2019 5:20:59 PM By: Kela Millin Entered By: Kela Millin on 04/28/2019 13:00:53 -------------------------------------------------------------------------------- Compression Therapy Details Patient Name: Date of Service: [name] 04/28/2019 1:00 PM Medical Record [id number] Patient Account Number: [id number] Date of Birth/Sex: Treating RN: November 19, 1955 (63 y.o. [name] Primary Care Pratham Cassatt: [name] Other Clinician: Referring Simaya Lumadue: Treating Raffaela Ladley/Extender:Robson,  [name], [name] in Treatment: 39 Compression Therapy Performed for Wound Wound #6 Right,Proximal,Posterior Lower Leg Assessment: Performed By: Clinician Kela Millin, RN Compression Type: Four Layer Electronic Signature(s) Signed: 04/29/2019 5:20:59 PM By: Kela Millin Entered By: Kela Millin on 04/28/2019 13:38:29 -------------------------------------------------------------------------------- Compression Therapy Details Patient Name: Date of Service: [name], [name] 04/28/2019 1:00 PM Medical Record [id number] Patient Account Number: [id number] Date of Birth/Sex: Treating RN: 10-Feb-1956 (63 y.o. [name] Primary Care Kialee Kham: [name] Other Clinician: Referring Letrell Attwood: Treating Najla Aughenbaugh/Extender:Robson, [name], [name] in Treatment: 39 Compression Therapy Performed for Wound Wound #7 Right,Anterior Lower Leg Assessment: Performed By: Clinician Kela Millin, RN Compression Type: Four Layer Electronic Signature(s) Signed: 04/29/2019 5:20:59 PM By: Kela Millin Entered By: Kela Millin on 04/28/2019 13:38:30 -------------------------------------------------------------------------------- Compression Therapy Details Patient Name: Date of Service: [name], [name] 04/28/2019 1:00 PM Medical Record [id number] Patient Account Number: [id number] Date of Birth/Sex: Treating RN: 1955-09-22 (63 y.o. [name] Primary Care Jerine Surles: [name] Other Clinician: Referring Branston Halsted: Treating Daved Mcfann/Extender:Robson, [name], [name] in Treatment: 39 Compression Therapy Performed for Wound Wound #8 Left,Anterior Lower Leg Assessment: Performed By: Clinician Kela Millin, RN Compression Type: Four Layer Electronic Signature(s) Signed: 04/29/2019 5:20:59 PM By: Kela Millin Entered By: Kela Millin on 04/28/2019  13:38:30 -------------------------------------------------------------------------------- Encounter Discharge Information Details Patient Name: Date of Service: [name]. 04/28/2019 1:00 PM Medical Record [id number] Patient Account Number: [id number] Date of Birth/Sex: Treating RN: [birth year] (63 y.o. [name] Primary Care Chaston Bradburn: [name] Other Clinician: Referring Mitcheal Sweetin: Treating Yevette Knust/Extender:Robson, [name], [name] in Treatment: 39 Encounter Discharge Information Items Discharge Condition: Stable Ambulatory Status: Ambulatory Discharge Destination: Home Transportation: Private Auto Accompanied By: self Schedule Follow-up Appointment: Yes Clinical Summary of Care: Patient Declined Electronic Signature(s) Signed: 04/29/2019 5:20:59 PM By: Kela Millin Entered By: Kela Millin on 04/28/2019 13:40:07 -------------------------------------------------------------------------------- Patient/Caregiver Education Details Patient Name: [name] 11/12/2020andnbsp1:00 Date of Service: PM Medical Record [name] Number: Patient Account Number: [id number] Treating RN: Date of Birth/Gender: 1955-11-08 (63  y.o. [name]) Other Clinician: Primary Care Physician: [name] Treating Linton Ham Referring Physician: Physician/Extender: Elicia Lamp in Treatment: 39 Education Assessment Education Provided To: Patient Education Topics Provided Venous: Methods: Explain/Verbal Responses: State content correctly Electronic Signature(s) Signed: 04/29/2019 5:20:59 PM By: Kela Millin Entered By: Kela Millin on 04/28/2019 13:39:53 -------------------------------------------------------------------------------- Wound Assessment Details Patient Name: Date of Service: [name], [name] 04/28/2019 1:00 PM Medical Record [id number] Patient Account  Number: [id number] Date of Birth/Sex: Treating RN: 1955-12-27 (63 y.o. [name] Primary Care Loraine Bhullar: [name] Other Clinician: Referring Lavona Norsworthy: Treating Aryahna Spagna/Extender:Robson, [name], [name] in Treatment: 39 Wound Status Wound Number: 6 Primary Venous Leg Ulcer Etiology: Wound Location: Right Lower Leg - Posterior, Proximal Wound Open Status: Wounding Event: Gradually Appeared Comorbid Asthma, Sleep Apnea, Arrhythmia, Date Acquired: 01/06/2019 History: Congestive Heart Failure, Hypertension, Type Weeks Of Treatment: 16 II Diabetes Clustered Wound: No Wound Measurements Length: (cm) 0.7 % Reduct Width: (cm) 0.6 % Reduct Depth: (cm) 0.1 Epitheli Area: (cm) 0.33 Tunneli Volume: (cm) 0.033 Wound Description Classification: Full Thickness Without Exposed Support Structures Wound Flat and Intact Margin: Exudate Medium Amount: Exudate Serosanguineous Type: Exudate red, brown Color: Wound Bed Granulation Amount: Large (67-100%) Granulation Quality: Red Necrotic Amount: None Present (0%) Foul Odor After Cleansing: No Slough/Fibrino No Exposed Structure Fascia Exposed: No Fat Layer (Subcutaneous Tissue) Exposed: Yes Tendon Exposed: No Muscle Exposed: No Joint Exposed: No Bone Exposed: No ion in Area: 16% ion in Volume: 15.4% alization: Large (67-100%) ng: No Treatment Notes Wound #6 (Right, Proximal, Posterior Lower Leg) 1. Cleanse With Wound Cleanser Soap and water 4. Secondary Dressing ABD Pad Kerramax/Xtrasorb 6. Support Layer Applied 4 layer compression wrap Notes only changed outer dressing, apligraft in place Electronic Signature(s) Signed: 04/29/2019 5:20:59 PM By: Kela Millin Entered By: Kela Millin on 04/28/2019 13:37:49 -------------------------------------------------------------------------------- Wound Assessment Details Patient Name: Date of Service: [name], [name]  04/28/2019 1:00 PM Medical Record [id number] Patient Account Number: [id number] Date of Birth/Sex: Treating RN: [birth year] (63 y.o. [name] Primary Care Vartan Kerins: [name] Other Clinician: Referring Deunta Beneke: Treating Alex Leahy/Extender:Robson, [name], [name] in Treatment: 39 Wound Status Wound Number: 7 Primary Venous Leg Ulcer Etiology: Wound Location: Right Lower Leg - Anterior Wound Open Wounding Event: Trauma Status: Date Acquired: 01/13/2019 Comorbid Asthma, Sleep Apnea, Arrhythmia, Weeks Of Treatment: 15 History: Congestive Heart Failure, Hypertension, Type Clustered Wound: No II Diabetes Wound Measurements Length: (cm) 0.7 % Reduct Width: (cm) 0.5 % Reduct Depth: (cm) 0.1 Epitheli Area: (cm) 0.275 Tunneli Volume: (cm) 0.027 Undermi Wound Description Full Thickness Without Exposed Support Foul Od Classification: Structures Slough/ Wound Well defined, not attached Margin: Exudate Medium Amount: Exudate Serosanguineous Type: Exudate red, brown Color: Wound Bed Granulation Amount: Large (67-100%) Granulation Quality: Red, Pink Fascia Necrotic Amount: None Present (0%) Fat Lay Tendon Muscle Joint E Bone Ex or After Cleansing: No Fibrino No Exposed Structure Exposed: No er (Subcutaneous Tissue) Exposed: Yes Exposed: No Exposed: No xposed: No posed: No ion in Area: -40.3% ion in Volume: -35% alization: Small (1-33%) ng: No ning: No Treatment Notes Wound #7 (Right, Anterior Lower Leg) 1. Cleanse With Wound Cleanser Soap and water 4. Secondary Dressing ABD Pad Kerramax/Xtrasorb 6. Support Layer Applied 4 layer compression wrap Notes only changed outer dressing, apligraft in place Electronic Signature(s) Signed: 04/29/2019 5:20:59 PM By: Kela Millin Entered By: Kela Millin on 04/28/2019 13:38:00 -------------------------------------------------------------------------------- Wound  Assessment Details Patient Name: Date of Service: [name], [name] 04/28/2019 1:00 PM Medical Record [id number] Patient  Account Number: [id number] Date of Birth/Sex: Treating RN: Sep 09, 1955 (63 y.o. [name] Primary Care Chasidy Janak: [name] Other Clinician: Referring Semisi Biela: Treating Chanah Tidmore/Extender:Robson, [name], [name] in Treatment: 39 Wound Status Wound Number: 8 Primary Venous Leg Ulcer Etiology: Wound Location: Left Lower Leg - Anterior Wound Open Wounding Event: Gradually Appeared Status: Date Acquired: 02/14/2019 Comorbid Asthma, Sleep Apnea, Arrhythmia, Weeks Of Treatment: 10 History: Congestive Heart Failure, Hypertension, Type Clustered Wound: No II Diabetes Wound Measurements Length: (cm) 6 % Reduct Width: (cm) 2.1 % Reduct Depth: (cm) 0.1 Epitheli Area: (cm) 9.896 Tunneli Volume: (cm) 0.99 Undermi Wound Description Full Thickness Without Exposed Support Foul Od Classification: Structures Slough/ Wound Wound Distinct, outline attached Margin: Exudate Medium Amount: Exudate Serosanguineous Type: Exudate red, brown Color: Wound Bed Granulation Amount: Large (67-100%) Granulation Quality: Red Fascia Ex Necrotic Amount: Small (1-33%) Fat Layer Necrotic Quality: Adherent Slough Tendon Ex Muscle Ex Joint Exp Bone Expo or After Cleansing: No Fibrino Yes Exposed Structure posed: No (Subcutaneous Tissue) Exposed: Yes posed: No posed: No osed: No sed: No ion in Area: -57.5% ion in Volume: -57.6% alization: Small (1-33%) ng: No ning: No Treatment Notes Wound #8 (Left, Anterior Lower Leg) 1. Cleanse With Wound Cleanser Soap and water 4. Secondary Dressing ABD Pad Kerramax/Xtrasorb 6. Support Layer Applied 4 layer compression wrap Notes only changed outer dressing, apligraft in place Electronic Signature(s) Signed: 04/29/2019 5:20:59 PM By: Kela Millin Entered By: Kela Millin on 04/28/2019 13:38:10 -------------------------------------------------------------------------------- Vitals Details Patient Name: Date of Service: [name]. 04/28/2019 1:00 PM Medical Record [id number] Patient Account Number: [id number] Date of Birth/Sex: Treating RN: March 18, 1956 (63 y.o. [name] Primary Care Audrina Marten: [name] Other Clinician: Referring Jammy Plotkin: Treating Zi Newbury/Extender:Robson, [name], [name] in Treatment: 39 Vital Signs Time Taken: 13:00 Temperature (F): 97.6 Height (in): 73 Pulse (bpm): 72 Weight (lbs): 290 Respiratory Rate (breaths/min): 18 Body Mass Index (BMI): 38.3 Blood Pressure (mmHg): 151/100 Reference Range: 80 - 120 mg / dl Notes patient stated he forgot to take his BP medication last night. no s/s of headache, dizziness, or fatigue. MD notified Electronic Signature(s) Signed: 04/29/2019 5:20:59 PM By: Kela Millin Entered By: Kela Millin on 04/28/2019 13:03:01

## 2019-05-02 ENCOUNTER — Other Ambulatory Visit: Payer: Self-pay | Admitting: Internal Medicine

## 2019-05-04 ENCOUNTER — Encounter: Payer: Self-pay | Admitting: Internal Medicine

## 2019-05-05 ENCOUNTER — Encounter (HOSPITAL_BASED_OUTPATIENT_CLINIC_OR_DEPARTMENT_OTHER): Payer: BLUE CROSS/BLUE SHIELD | Admitting: Internal Medicine

## 2019-05-05 ENCOUNTER — Other Ambulatory Visit: Payer: Self-pay

## 2019-05-05 DIAGNOSIS — I87313 Chronic venous hypertension (idiopathic) with ulcer of bilateral lower extremity: Secondary | ICD-10-CM | POA: Diagnosis not present

## 2019-05-05 DIAGNOSIS — I110 Hypertensive heart disease with heart failure: Secondary | ICD-10-CM | POA: Diagnosis not present

## 2019-05-05 DIAGNOSIS — I4891 Unspecified atrial fibrillation: Secondary | ICD-10-CM | POA: Diagnosis not present

## 2019-05-05 DIAGNOSIS — G4730 Sleep apnea, unspecified: Secondary | ICD-10-CM | POA: Diagnosis not present

## 2019-05-05 DIAGNOSIS — J45909 Unspecified asthma, uncomplicated: Secondary | ICD-10-CM | POA: Diagnosis not present

## 2019-05-05 DIAGNOSIS — L97829 Non-pressure chronic ulcer of other part of left lower leg with unspecified severity: Secondary | ICD-10-CM | POA: Diagnosis not present

## 2019-05-05 DIAGNOSIS — S81811A Laceration without foreign body, right lower leg, initial encounter: Secondary | ICD-10-CM | POA: Diagnosis not present

## 2019-05-05 DIAGNOSIS — L03115 Cellulitis of right lower limb: Secondary | ICD-10-CM | POA: Diagnosis not present

## 2019-05-05 DIAGNOSIS — L97212 Non-pressure chronic ulcer of right calf with fat layer exposed: Secondary | ICD-10-CM | POA: Diagnosis not present

## 2019-05-05 DIAGNOSIS — I429 Cardiomyopathy, unspecified: Secondary | ICD-10-CM | POA: Diagnosis not present

## 2019-05-05 DIAGNOSIS — Z8619 Personal history of other infectious and parasitic diseases: Secondary | ICD-10-CM | POA: Diagnosis not present

## 2019-05-05 DIAGNOSIS — X58XXXA Exposure to other specified factors, initial encounter: Secondary | ICD-10-CM | POA: Diagnosis not present

## 2019-05-05 DIAGNOSIS — M5412 Radiculopathy, cervical region: Secondary | ICD-10-CM | POA: Diagnosis not present

## 2019-05-05 DIAGNOSIS — I5022 Chronic systolic (congestive) heart failure: Secondary | ICD-10-CM | POA: Diagnosis not present

## 2019-05-05 DIAGNOSIS — I872 Venous insufficiency (chronic) (peripheral): Secondary | ICD-10-CM | POA: Diagnosis not present

## 2019-05-05 DIAGNOSIS — E11622 Type 2 diabetes mellitus with other skin ulcer: Secondary | ICD-10-CM | POA: Diagnosis not present

## 2019-05-05 DIAGNOSIS — Z7901 Long term (current) use of anticoagulants: Secondary | ICD-10-CM | POA: Diagnosis not present

## 2019-05-05 NOTE — Progress Notes (Addendum)
VISENTE, KIRKER ([id number]) Visit Report for 05/05/2019 Arrival Information Details Patient Name: Date of Service: TRAEVON, MEIRING 05/05/2019 1:00 PM Medical Record [id number] Patient Account Number: [id number] Date of Birth/Sex: Treating RN: [birth year] (63 y.o. [name] Primary Care Akacia Boltz: [name] Other Clinician: Referring Jaeshaun Riva: Treating Zenaida Tesar/Extender:Robson, [name], [name] in Treatment: 57 Visit Information History Since Last Visit Added or deleted any medications: No Patient Arrived: Ambulatory Any new allergies or adverse reactions: No Arrival Time: 13:45 Had a fall or experienced change in No Accompanied By: self activities of daily living that may affect Transfer Assistance: None risk of falls: Patient Identification Verified: Yes Signs or symptoms of abuse/neglect since last No Secondary Verification Process Yes visito Completed: Hospitalized since last visit: No Patient Requires Transmission-Based No Implantable device outside of the clinic excluding No Precautions: cellular tissue based products placed in the center Patient Has Alerts: Yes since last visit: Patient Alerts: Patient on Blood Has Dressing in Place as Prescribed: Yes Thinner Has Compression in Place as Prescribed: Yes xarelto Pain Present Now: No R TBI = .86, L TBI = .87 Electronic Signature(s) Signed: 05/05/2019 6:05:31 PM By: Kela Millin Entered By: Kela Millin on 05/05/2019 13:46:18 -------------------------------------------------------------------------------- Encounter Discharge Information Details Patient Name: Date of Service: [name] 05/05/2019 1:00 PM Medical Record [id number] Patient Account Number: [id number] Date of Birth/Sex: Treating RN: 05/26/1956 (63 y.o. [name] Primary Care Lanique Gonzalo: [name] Other Clinician: Referring Quayshawn Nin: Treating  Jynesis Nakamura/Extender:Robson, [name], [name] in Treatment: 40 Encounter Discharge Information Items Post Procedure Vitals Discharge Condition: Stable Temperature (F): 97.7 Ambulatory Status: Ambulatory Pulse (bpm): 72 Discharge Destination: Home Respiratory Rate (breaths/min): 18 Transportation: Private Auto Blood Pressure (mmHg): 181/99 Accompanied By: self Schedule Follow-up Appointment: Yes Clinical Summary of Care: Patient Declined Electronic Signature(s) Signed: 05/05/2019 6:27:04 PM By: Baruch Gouty RN, BSN Entered By: Baruch Gouty on 05/05/2019 15:27:19 -------------------------------------------------------------------------------- Lower Extremity Assessment Details Patient Name: Date of Service: [name] 05/05/2019 1:00 PM Medical Record [id number] Patient Account Number: [id number] Date of Birth/Sex: Treating RN: 30-Jul-1955 (63 y.o. [name] Primary Care Marlo Goodrich: [name] Other Clinician: Referring Kella Splinter: Treating Yuridiana Formanek/Extender:Robson, [name], [name] in Treatment: 40 Edema Assessment Assessed: [Left: No] [Right: No] Edema: [Left: No] [Right: No] Calf Left: Right: Point of Measurement: cm From Medial Instep 38.4 cm 37 cm Ankle Left: Right: Point of Measurement: cm From Medial Instep 23.6 cm 21.5 cm Vascular Assessment Pulses: Dorsalis Pedis Palpable: [Left:Yes] [Right:Yes] Electronic Signature(s) Signed: 05/05/2019 6:05:31 PM By: Kela Millin Entered By: Kela Millin on 05/05/2019 13:49:09 -------------------------------------------------------------------------------- Multi Wound Chart Details Patient Name: Date of Service: [name] 05/05/2019 1:00 PM Medical Record [id number] Patient Account Number: [id number] Date of Birth/Sex: Treating RN: March 22, 1956 (63 y.o. [name], [name] Primary Care Treshaun Carrico: Other Clinician: [name] Referring Davione Lenker: Treating Gillian Kluever/Extender:Robson, [name], [name] in Treatment: 40 Vital Signs Height(in): 92 Pulse(bpm): 25 Weight(lbs): 290 Blood Pressure(mmHg): 181/99 Body Mass Index(BMI): 38 Temperature(F): 97.7 Respiratory 18 Rate(breaths/min): Photos: [6:No Photos] [7:No Photos] [8:No Photos] Wound Location: [6:Right Lower Leg - Posterior, Right Lower Leg - Anterior Left Lower Leg - Anterior Proximal] Wounding Event: [6:Gradually Appeared] [7:Trauma] [8:Gradually Appeared] Primary Etiology: [6:Venous Leg Ulcer] [7:Venous Leg Ulcer] [8:Venous Leg Ulcer] Comorbid History: [6:Asthma, Sleep Apnea, Arrhythmia, Congestive Heart Failure, Hypertension, Heart Failure, Hypertension, Heart Failure, Hypertension, Type II Diabetes] [7:Asthma, Sleep Apnea, Arrhythmia, Congestive Type II Diabetes] [8:Asthma, Sleep  Apnea, Arrhythmia, Congestive Type II Diabetes] Date Acquired: [6:01/06/2019] [7:01/13/2019] [8:02/14/2019] Weeks of Treatment: [  6:17] [7:16] [8:11] Wound Status: [6:Open] [7:Open] [8:Open] Measurements L x W x D 0.3x0.2x0.1 [7:0x0x0] [8:5.2x2.1x0.1] (cm) Area (cm) : [6:0.047] [7:0] [8:8.577] Volume (cm) : [6:0.005] [7:0] [8:0.858] % Reduction in Area: [6:88.00%] [7:100.00%] [8:-36.50%] % Reduction in Volume: 87.20% [7:100.00%] [8:-36.60%] Classification: [6:Full Thickness Without Exposed Support Structures Exposed Support Structures Exposed Support Structures] [7:Full Thickness Without] [8:Full Thickness Without] Exudate Amount: [6:Medium] [7:None Present] [8:Medium] Exudate Type: [6:Serosanguineous] [7:N/A] [8:Serosanguineous] Exudate Color: [6:red, brown] [7:N/A] [8:red, brown] Wound Margin: [6:Flat and Intact] [7:Flat and Intact] [8:Distinct, outline attached] Granulation Amount: [6:Large (67-100%)] [7:None Present (0%)] [8:Large (67-100%)] Granulation Quality: [6:Red] [7:N/A] [8:Red, Hyper-granulation] Necrotic Amount: [6:None Present  (0%)] [7:None Present (0%)] [8:None Present (0%)] Exposed Structures: [6:Fat Layer (Subcutaneous Fascia: No Tissue) Exposed: Yes Fascia: No Tendon: No Muscle: No Joint: No Bone: No] [7:Fat Layer (Subcutaneous Fat Layer (Subcutaneous Tissue) Exposed: No Tendon: No Muscle: No Joint: No Bone: No] [8:Fascia: No Tissue)  Exposed: No Tendon: No Muscle: No Joint: No Bone: No] Epithelialization: [6:Large (67-100%)] [7:None] [8:Small (1-33%)] Procedures Performed: Cellular or Tissue Based N/A [6:Product] [8:Cellular or Tissue Based Product] Treatment Notes Electronic Signature(s) Signed: 05/05/2019 6:25:25 PM By: Linton Ham MD Signed: 05/05/2019 6:38:14 PM By: Deon Pilling Entered By: Linton Ham on 05/05/2019 14:23:05 -------------------------------------------------------------------------------- Multi-Disciplinary Care Plan Details Patient Name: Date of Service: [name] 05/05/2019 1:00 PM Medical Record [id number] Patient Account Number: [id number] Date of Birth/Sex: Treating RN: 1955/09/14 (63 y.o. [name] Primary Care Keni Elison: [name] Other Clinician: Referring Xan Sparkman: Treating Deloris Mittag/Extender:Robson, [name], [name] in Treatment: 40 Active Inactive Venous Leg Ulcer Nursing Diagnoses: Actual venous Insuffiency (use after diagnosis is confirmed) Knowledge deficit related to disease process and management Goals: Non-invasive venous studies are completed as ordered Date Initiated: 09/17/2018 Date Inactivated: 09/23/2018 Target Resolution Date: 09/23/2018 Goal Status: Met Patient will maintain optimal edema control Date Initiated: 09/17/2018 Target Resolution Date: 06/17/2019 Goal Status: Active Patient/caregiver will verbalize understanding of disease process and disease management Date Initiated: 09/17/2018 Date Inactivated: 09/23/2018 Target Resolution Date: 10/15/2018 Goal Status: Met Interventions: Assess peripheral edema  status every visit. Compression as ordered Provide education on venous insufficiency Treatment Activities: Therapeutic compression applied : 09/17/2018 Notes: Electronic Signature(s) Signed: 05/05/2019 6:38:14 PM By: Deon Pilling Entered By: Deon Pilling on 05/05/2019 13:13:05 -------------------------------------------------------------------------------- Pain Assessment Details Patient Name: Date of Service: JAIDAN, PREVETTE 05/05/2019 1:00 PM Medical Record [id number] Patient Account Number: [id number] Date of Birth/Sex: Treating RN: 1956/02/20 (63 y.o. [name] Primary Care Bryor Rami: [name] Other Clinician: Referring Ceonna Frazzini: Treating Evanee Lubrano/Extender:Robson, [name], [name] in Treatment: 40 Active Problems Location of Pain Severity and Description of Pain Patient Has Paino No Site Locations Pain Management and Medication Current Pain Management: Electronic Signature(s) Signed: 05/05/2019 6:05:31 PM By: Kela Millin Entered By: Kela Millin on 05/05/2019 13:46:49 -------------------------------------------------------------------------------- Patient/Caregiver Education Details Patient Name: Date of Service: [name] 11/19/2020andnbsp1:00 PM Medical Record [id number] Patient Account Number: [id number] Date of Birth/Gender: Treating RN: Jan 18, 1956 (63 y.o. [name] Primary Care Physician: [name] Other Clinician: Referring Physician: Treating Physician/Extender:Robson, [name], [name] in Treatment: 70 Education Assessment Education Provided To: Patient Education Topics Provided Venous: Handouts: Managing Venous Disease and Related Ulcers Methods: Explain/Verbal Responses: Reinforcements needed Electronic Signature(s) Signed: 05/05/2019 6:38:14 PM By: Deon Pilling Entered By: Deon Pilling on 05/05/2019  13:13:17 -------------------------------------------------------------------------------- Wound Assessment Details Patient Name: Date of Service: HARLOW, CARRIZALES 05/05/2019 1:00 PM Medical Record [id number] Patient Account Number: [id number] Date of Birth/Sex: Treating RN: May 23, 1956 (63 y.o. M)  Kela Millin Primary Care Armentha Branagan: [name] Other Clinician: Referring Raeanna Soberanes: Treating Rohnan Bartleson/Extender:Robson, [name], [name] in Treatment: 40 Wound Status Wound Number: 6 Primary Venous Leg Ulcer Etiology: Wound Location: Right Lower Leg - Posterior, Proximal Wound Open Status: Wounding Event: Gradually Appeared Comorbid Asthma, Sleep Apnea, Arrhythmia, Congestive Date Acquired: 01/06/2019 History: Heart Failure, Hypertension, Type II Diabetes Weeks Of Treatment: 17 Clustered Wound: No Photos Wound Measurements Length: (cm) 0.3 % Reduct Width: (cm) 0.2 % Reduct Depth: (cm) 0.1 Epitheli Area: (cm) 0.047 Tunneli Volume: (cm) 0.005 Undermi Wound Description Full Thickness Without Exposed Support Foul Od Classification: Structures Slough/ Wound Flat and Intact Margin: Exudate Medium Amount: Exudate Serosanguineous Serosanguineous Type: Exudate red, brown Color: Wound Bed Granulation Amount: Large (67-100%) Granulation Quality: Red Fas Necrotic Amount: None Present (0%) Fat Ten Mus Joi Bon or After Cleansing: No Fibrino No Exposed Structure cia Exposed: No Layer (Subcutaneous Tissue) Exposed: Yes don Exposed: No cle Exposed: No nt Exposed: No e Exposed: No ion in Area: 88% ion in Volume: 87.2% alization: Large (67-100%) ng: No ning: No Treatment Notes Wound #6 (Right, Proximal, Posterior Lower Leg) 2. Periwound Care Moisturizing lotion 3. Primary Dressing Applied Cellular Based Tissue Product 4. Secondary Dressing Dry Gauze Kerramax/Xtrasorb 6. Support Layer Applied 4 layer compression  wrap Notes apligraf covered with adaptic and secured with steristrips Electronic Signature(s) Signed: 05/09/2019 2:17:56 PM By: Kela Millin Signed: 05/10/2019 7:37:04 AM By: Mikeal Hawthorne EMT/HBOT Previous Signature: 05/05/2019 6:05:31 PM Version By: Kela Millin Entered By: Mikeal Hawthorne on 05/09/2019 13:40:18 -------------------------------------------------------------------------------- Wound Assessment Details Patient Name: Date of Service: [name] 05/05/2019 1:00 PM Medical Record [id number] Patient Account Number: [id number] Date of Birth/Sex: Treating RN: 1956-03-20 (63 y.o. [name] Primary Care Lakai Moree: [name] Other Clinician: Referring Ailany Koren: Treating Sianna Garofano/Extender:Robson, [name], [name] in Treatment: 40 Wound Status Wound Number: 7 Primary Venous Leg Ulcer Etiology: Wound Location: Right Lower Leg - Anterior Wound Healed - Epithelialized Wounding Event: Trauma Status: Date Acquired: 01/13/2019 Comorbid Asthma, Sleep Apnea, Arrhythmia, Congestive Weeks Of Treatment: 16 History: Heart Failure, Hypertension, Type II Diabetes Clustered Wound: No Photos Wound Measurements Length: (cm) 0 % Reduct Width: (cm) 0 % Reduct Depth: (cm) 0 Epitheli Area: (cm) 0 Tunneli Volume: (cm) 0 Undermi Wound Description Full Thickness Without Exposed Support Foul Od Classification: Structures Slough/ Wound Flat and Intact Margin: Exudate None Present Amount: Wound Bed Granulation Amount: None Present (0%) Necrotic Amount: None Present (0%) Fascia Fat Lay Tendon Muscle Joint E Bone Ex or After Cleansing: No Fibrino No Exposed Structure Exposed: No er (Subcutaneous Tissue) Exposed: No Exposed: No Exposed: No xposed: No posed: No ion in Area: 100% ion in Volume: 100% alization: None ng: No ning: No Electronic Signature(s) Signed: 05/09/2019 2:17:56 PM By: Kela Millin Signed: 05/10/2019 7:37:04 AM By: Mikeal Hawthorne EMT/HBOT Previous Signature: 05/05/2019 6:05:31 PM Version By: Kela Millin Entered By: Mikeal Hawthorne on 05/09/2019 13:39:52 -------------------------------------------------------------------------------- Wound Assessment Details Patient Name: Date of Service: [name] 05/05/2019 1:00 PM Medical Record [id number] Patient Account Number: [id number] Date of Birth/Sex: Treating RN: [birth year] (63 y.o. [name] Primary Care Jonet Mathies: [name] Other Clinician: Referring Annlouise Gerety: Treating Damyen Knoll/Extender:Robson, [name], [name] in Treatment: 40 Wound Status Wound Number: 8 Primary Venous Leg Ulcer Etiology: Wound Location: Left Lower Leg - Anterior Wound Open Wounding Event: Gradually Appeared Status: Date Acquired: 02/14/2019 Comorbid Asthma, Sleep Apnea, Arrhythmia, Congestive Weeks Of Treatment: 11 History: Heart Failure, Hypertension, Type II Diabetes Clustered Wound: No Photos Wound  Measurements Length: (cm) 5.2 % Re Width: (cm) 2.1 % Re Depth: (cm) 0.1 Epit Area: (cm) 8.577 Tun Volume: (cm) 0.858 Und Wound Description Full Thickness Without Exposed Support Classification: Structures Wound Distinct, outline attached Margin: Exudate Medium Amount: Exudate Serosanguineous Type: Exudate red, brown Color: Wound Bed Granulation Amount: Large (67-100%) Granulation Quality: Red, Hyper-granulation Necrotic Amount: None Present (0%) Foul Odor After Cleansing: No Slough/Fibrino No Exposed Structure Fascia Exposed: No Fat Layer (Subcutaneous Tissue) Exposed: No Tendon Exposed: No Muscle Exposed: No Joint Exposed: No Bone Exposed: No duction in Area: -36.5% duction in Volume: -36.6% helialization: Small (1-33%) neling: No ermining: No Treatment Notes Wound #8 (Left, Anterior Lower Leg) 2. Periwound Care Moisturizing lotion 3. Primary  Dressing Applied Cellular Based Tissue Product 4. Secondary Dressing Dry Gauze Kerramax/Xtrasorb 6. Support Layer Applied 4 layer compression wrap Notes apligraf covered with adaptic and secured with steristrips Electronic Signature(s) Signed: 05/09/2019 2:17:56 PM By: Kela Millin Signed: 05/10/2019 7:37:04 AM By: Mikeal Hawthorne EMT/HBOT Previous Signature: 05/05/2019 6:05:31 PM Version By: Kela Millin Entered By: Mikeal Hawthorne on 05/09/2019 13:42:44 -------------------------------------------------------------------------------- Vitals Details Patient Name: Date of Service: [name] 05/05/2019 1:00 PM Medical Record [id number] Patient Account Number: [id number] Date of Birth/Sex: Treating RN: [birth year] (63 y.o. [name] Primary Care Felix Pratt: [name] Other Clinician: Referring Mao Lockner: Treating Ambria Mayfield/Extender:Robson, [name], [name] in Treatment: 40 Vital Signs Time Taken: 13:45 Temperature (F): 97.7 Height (in): 73 Pulse (bpm): 72 Weight (lbs): 290 Respiratory Rate (breaths/min): 18 Body Mass Index (BMI): 38.3 Blood Pressure (mmHg): 181/99 Reference Range: 80 - 120 mg / dl Electronic Signature(s) Signed: 05/05/2019 6:05:31 PM By: Kela Millin Entered By: Kela Millin on 05/05/2019 13:46:42

## 2019-05-05 NOTE — Progress Notes (Signed)
[name], [name] ([id number]) Visit Report for 05/05/2019 Cellular or Tissue Based Product Details Patient Name: Date of Service: [name], [name] 05/05/2019 1:00 PM Medical Record [id number] Patient Account Number: [id number] Date of Birth/Sex: September 18, 1955 (63 y.o. M) Treating RN: Deon Pilling Primary Care Provider: [name] Other Clinician: Referring Provider: Treating Provider/Extender:Markella Dao, [name], [name] in Treatment: 6 Cellular or Tissue Based Wound #8 Left,Anterior Lower Leg Product Type Applied to: Performed By: Physician Ricard Dillon., MD Cellular or Tissue Based Apligraf Product Type: Level of Consciousness (Pre- Awake and Alert procedure): Pre-procedure Yes - 14:14 Verification/Time Out Taken: Location: trunk / arms / legs Wound Size (sq cm): 10.92 Product Size (sq cm): 22 Waste Size (sq cm): 0 Waste Reason: wound size Amount of Product Applied (sq cm): 22 Lot #: [id number] Order #: 2 Expiration Date: 05/12/2019 Fenestrated: Yes Instrument: Blade Reconstituted: Yes Solution Type: normal saline Solution Amount: 10 mL Lot #[id number] Expiration Date: 10/13/2020 Secured: Yes Secured With: Steri-Strips, adaptic Dressing Applied: No Procedural Pain: 0 Post Procedural Pain: 0 Response to Treatment: Procedure was tolerated well Level of Consciousness Awake and Alert (Post-procedure): Post Procedure Diagnosis Same as Pre-procedure Electronic Signature(s) Signed: 05/05/2019 6:25:25 PM By: Linton Ham MD Signed: 05/05/2019 6:38:14 PM By: Deon Pilling Entered By: Deon Pilling on 05/05/2019 14:23:55 -------------------------------------------------------------------------------- Cellular or Tissue Based Product Details Patient Name: Date of Service: [name] 05/05/2019 1:00 PM Medical Record [id number] Patient Account Number: [id number] Date of Birth/Sex: 1955/08/30 (63 y.o. M) Treating  RN: Deon Pilling Primary Care Provider: [name] Other Clinician: Referring Provider: Treating Provider/Extender:Eryn Marandola, [name], [name] in Treatment: 31 Cellular or Tissue Based Wound #6 Right,Proximal,Posterior Lower Leg Product Type Applied to: Performed By: Physician Ricard Dillon., MD Cellular or Tissue Based Apligraf Product Type: Level of Consciousness (Pre- Awake and Alert procedure): Pre-procedure Yes - 14:14 Verification/Time Out Taken: Location: trunk / arms / legs Wound Size (sq cm): 0.06 Product Size (sq cm): 22 Waste Size (sq cm): 11 Waste Reason: wound size Amount of Product Applied (sq cm): 11 Instrument Used: Forceps, Scissors Lot #: [id number] Order #: 2 Expiration Date: 05/12/2019 Fenestrated: Yes Instrument: Blade Reconstituted: Yes Solution Type: normal saline Solution Amount: 10 mL Lot #: [id number] Solution Expiration Date: 10/13/2020 Secured: Yes Secured With: Steri-Strips, adaptic Dressing Applied: No Procedural Pain: 0 Post Procedural Pain: 0 Response to Treatment: Procedure was tolerated well Level of Consciousness Awake and Alert (Post-procedure): Post Procedure Diagnosis Same as Pre-procedure Electronic Signature(s) Signed: 05/05/2019 6:25:25 PM By: Linton Ham MD Signed: 05/05/2019 6:38:14 PM By: Deon Pilling Entered By: Deon Pilling on 05/05/2019 14:24:15 -------------------------------------------------------------------------------- HPI Details Patient Name: Date of Service: [name]. 05/05/2019 1:00 PM Medical Record [id number] Patient Account Number: [id number] Date of Birth/Sex: Treating RN: 1955-07-28 (63 y.o. [name] Primary Care Provider: [name] Other Clinician: Referring Provider: Treating Provider/Extender:Foster Sonnier, [name], [name] in Treatment: 40 History of Present Illness HPI Description: ADMISSION 07/29/2018 [name]  is a 63 year old man with either prediabetes or diabetes he is on glipizide. In late October to November 2019 he noted a scabbed area on the back of his right calf. He picked this off a few times but it would not heal. He saw his primary physician on 12/5. It was felt at that time that this may actually heal on its own with conservative management. The next visit was on 07/20/2018 noted the area was a lot worse and arranged for his treatment here. He has been topical antibiotics  like Neosporin although he stopped using this when the wound looked worse. He is just been covering this with a clean Band-Aid. He is given Bactrim a week ago and he is finishing this currently. It is made some improvement in the surrounding erythema per the patient. He does not have a history of nonhealing wounds. No prior history of wounds on his legs that he had difficulty healing. No prior skin issues. 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 Past medical history; A. fib status post ablation and recent cardioversion he is on Xarelto. He has a history of systolic heart failure, cervical radiculopathy, cardiomyopathy and type 2 diabetes as discussed ABI in the right leg was noncompressible on the right 2/20; the biopsy I did on the patient last week was negative for malignancy. Culture grew Pseudomonas and methicillin sensitive staph aureus. He is on cefdinir 300 twice a day. I will have to make this a 10 day course. He put him in compression. He states that the redness pain and erythema are a lot better. I suspect the patient has chronic venous insufficiency probably with a secondary cellulitis 2/27; arrives today with copious amounts of drainage coming out of the wound irritating the skin below the wound area. He only renewed the final 3 days of cefdinir today 3/5; came in for a nurse change 3 days ago. Again a lot of drainage noted. Zinc oxide was applied unfortunately the drainage appears to a pool then  he has a string of superficial open areas extending down into the Achilles area and some just below the wound. The wound itself does not look too bad. He is completed the antibiotics although apparently there was separation from the original 7 with the last 3 days. Culture grew a few MSSA and a few Pseudomonas 3/12; not too much difference over the last week. He came in for a dressing change on Monday by our nursing staff. Necrotic debris again over the wound surface. The entire area looks irritated but nontender and I do not think shows obvious evidence of infection. We have not heard anything about the reflux studies. Also notable than in this diabetic man we had noncompressible vessels and although I can feel his pulses easily in his feet I will order arterial studies as well. 3/19-Patient had experience more pain has been dressed with a silver alginate, the wounds all have necrotic debris, very friable with easy bleeding with any kind of surface debridement including with gauze and Anasept and with a #3 curette. His vascular appointments unfortunately were all canceled on account of the virus outbreak resulting in studies only being done for emergent cases. His pulses are easily palpable in the lower extremity. He has open areas below the primary wound where he states the drainage which included purulent material also with some blood pooled and caused breakdown. 3/26; the patient was changed to Santyl with [employer] backing last week. This was largely because the silver alginate was sticking to the wound. He is still having quite a bit of pain. He tells [place] that the reflux studies and arterial studies we had attempted to arrange through vein and vascular are not being booked until mid May. Culture that was done last week showed both Serratia moderate and a few methicillin sensitive staph aureus I started him on Bactrim DS 1 p.o. twice daily on 3/23 for 7 days. He is here for  follow-up. 4/3; patient is on Santyl with Hydrofera Blue. Patient complains of pain.  Our intake nurse is noted drainage and some odor. He has completed Bactrim recently for Serratia and a few methicillin sensitive staph aureus. After our staff push hard last week we were able to get venous studies as well as arterial studies. His arterial studies showed on the right great toe pressure of 0.86. On the left ABI at 1.47 TBI of 0.87. On both sides waveforms were triphasic VENOUS STUDIES showed reflux in the femoral vein, popliteal vein great saphenous vein at the saphenofemoral junction and great saphenous vein at the proximal thigh. Also noted to have age-indeterminate superficial vein thrombosis involving the small saphenous vein. Notable that the patient is already on Xarelto for atrial fibrillation. 4/9; the patient has been to see vascular surgery and reviewed by Dr. [name]. He was felt to have only minimal superficial venous reflux in the right great greater saphenous vein. For this reason he was not felt to be a candidate for laser ablation of the right greater saphenous vein. A wedge cushion was suggested to keep his leg elevated. He does have deep venous reflux on the right. Culture I did last time showed a combination of Serratia Pseudomonas and methicillin sensitive staph aureus. We have been using Hydrofera Blue to the large wound, Santyl to some of the deep punched out satellite lesions distal to it. There is some improvement [id number]; the application for Apligraf was put out for further review for material that we have resubmitted. He has the deep area on the right posterior calf which is large and then 3 small punched out areas beneath this. In general his wounds look a lot better 4/23; the patient has his large original wound and then 2 punched out satellite lesions below it on the right posterior calf. I was usually able to use Apligraf #1 to cover the full surface area of the larger  wound. We continued with Santyl and Hydrofera Blue on the 2 punched-out areas 5/7; patient has his large original wound and 2 punched out satellite lesions below it in the right posterior calf. Apligraf #2 today. Major improvement in the big wound in terms of wound depth. Punched-out [place] wounds have a very healthy looking wound bed. 5/21-Patient comes back for his large right posterior calf wound for which she has been an Apligraf #3 today. Wound depth seems to be better, the punched-out wounds appear to have healthy bed with some bleeding 6/4; right posterior calf wound is much better. Apligraf #4 today. The major wound has no wound depth at all. Only 1 of the satellite lesions is still open. The patient has very high blood pressure coming in today with a diastolic blood pressure of 130 after lying there for a few minutes it was down to 110. He states that is running [id number] diastolic at home. He was high last week as well I thought he was on 50 mg 1/2 tablet twice daily of losartan I told him to double up on that however it turns out he is actually on 50 twice daily. Course he is run out of his medications prematurely. He has a follow-up with his primary's office next Wednesday. 6/18; patient's wounds continue to improve. The small area laterally is just about closed and there is continued epithelialization on the area on the posterior calf. 7/2; we applied his last Apligraf last visit. Early the next week there was a lot of purulent looking drainage that I cultured this grew staph and Pseudomonas however when we had him back for the next  visit to 3 days later everything looked a lot better. He did not receive systemic antibiotics. Fortunately we did not have to remove the Apligraf. He has had heart trouble this week he was having an ablation apparently they have discovered a clot in his left atrium they have changed all his anticoagulants as well as his blood pressure medication. 7/16;  using Hydrofera Blue. We are making nice progress towards closure. He has not yet ordered his compression stockings he has his measurements 7/23; dimensions are better. He has a new satellite area superiorly. Both wounds cauterized with silver nitrate. 7/30; absolutely no change in the major wound on the posterior part of the right calf. We have been using Hydrofera Blue for a prolonged period of time and really had a nice improvement but over the last few weeks this is stalled. There is no depth. He arrived in clinic today with a new area on the right anterior tibia which apparently was an "ingrown hair". It is clearly an open area. This looks like a wrap injury although he was not really aware of it. 8/31; since the patient was last in clinic he was admitted to hospital from 8/17 through 8/22. He had had multiple falls at home including one off a ladder. He was admitted to hospital with mild COVID-19 viral pneumonia. Noted to be hyperkalemic and in acute renal failure. He has chronic systolic heart failure with ejection fraction of 35%. He arrives back in clinic with the original wound on the posterior aspect of the right lower calf looking a lot better. He has eschared areas from falls and lacerations on his anterior knees bilaterally just below the patella and on the mid part of his tibia bilaterally. He seems to have a small area on the right lateral ankle as well 9/10- Patient comes to clinic with a 2 new wounds one on the left anterior shin and one on the right posterior leg both the left knee and right knee areas are healed up. We have been using Santyl to the left anterior leg and silver alginate to right posterior leg wounds. 9/17; the patient is original wound looks good on the posterior right calf. He has traumatic areas on the left anterior shin left anterior patellar tendon and the right mid tibia area. Except for the right posterior calf all required debridement. We have been using  Santyl on these areas and silver alginate posteriorly right calf 9/24; the original wound on the posterior calf on the right looks good. This is healthy. There are traumatic wounds in the left anterior shin, left anterior patella in the right mid tibia area are about the same. He has dusky erythema on the left anterior tibia which led me to give him antibiotics last week. This does not look too much different. This is nontender and I wonder if this is all just venous inflammation. 10/2; the patient has 2 open areas on the right posterior calf both of these look good we have been using silver alginate. He has traumatic wounds on the left and right mid tibia areas. The surface of these wounds is still not ready for closure. We have been using Iodoflex. Finally has areas on the left knee. The latter wounds are all traumatic after a fall 10/9. His original wounds of the right posterior calf are superficial and progressing towards closure. His traumatic wounds on the left anterior tibia and right anterior tibia are considerably improved in terms of the wound surface. He asked me about  a skin graft in these areas I do not think this is going to be necessary. Change from Iodosorb to [facility]. The area on the left knee is just about closed as well 10/16; his original wound on the right posterior calf very superficial looks healthy. He has more recent traumatic wounds on the left anterior tibia and right anterior tibia both of these have better looking surfaces and measuring smaller. Might be beneficial for an advanced treatment product. The area on his left knee has a small superficial scab and we are going to close that when 10/23; his original wound areas on the right posterior calf continue to improve. The more recently traumatic wounds on the left anterior tibia and right anterior tibia both look better in terms of surfaces. No debridement required. We have been using Hydrofera Blue. He is  approved for Apligraf 11/3; Apligraf #1 applied to the left anterior tibia right anterior tibia and to 2 small areas remaining on the right posterior calf which were part of his initial wound area. 11/19; Apligraf #2. Left anterior tibia is healed. Right anterior tibia much better. Only one small area remains on the right posterior calf which was part of his initial wound area Electronic Signature(s) Signed: 05/05/2019 6:25:25 PM By: Linton Ham MD Entered By: Linton Ham on 05/05/2019 14:24:01 -------------------------------------------------------------------------------- Physical Exam Details Patient Name: Date of Service: [name] 05/05/2019 1:00 PM Medical Record [id number] Patient Account Number: [id number] Date of Birth/Sex: Treating RN: 1956-01-02 (63 y.o. [name] Primary Care Provider: [name] Other Clinician: Referring Provider: Treating Provider/Extender:Omolara Carol, [name], [name] in Treatment: 66 Constitutional Patient is hypertensive. States his blood pressure is running in the normal range at home however. Pulse regular and within target range for patient.[name] Respirations regular, non-labored and within target range.. Temperature is normal and within the target range for the patient.[name] Appears in no distress. Eyes Conjunctivae clear. No discharge.no icterus. Integumentary (Hair, Skin) No evidence of infection around any wound area. Notes Wound exam; right anterior tibia is healed. Left anterior tibia looks healthy. Small remaining wound on the posterior right calf. Apligraf #2 applied to all remaining wound areas Electronic Signature(s) Signed: 05/05/2019 6:25:25 PM By: Linton Ham MD Entered By: Linton Ham on 05/05/2019 14:26:06 -------------------------------------------------------------------------------- Physician Orders Details Patient Name: Date of Service: [name]. 05/05/2019 1:00  PM Medical Record [id number] Patient Account Number: [id number] Date of Birth/Sex: Treating RN: [birth year] (63 y.o. [name], Tammi Klippel Primary Care Provider: [name] Other Clinician: Referring Provider: Treating Provider/Extender:Marisah Laker, [name], [name] in Treatment: [id number] Verbal / Phone Orders: No Diagnosis Coding ICD-10 Coding Code Description [phone] Non-pressure chronic ulcer of right calf with fat layer exposed E11.622 Type 2 diabetes mellitus with other skin ulcer I87.321 Chronic venous hypertension (idiopathic) with inflammation of right lower extremity S81.811D Laceration without foreign body, right lower leg, subsequent encounter S81.812D Laceration without foreign body, left lower leg, subsequent encounter L03.115 Cellulitis of right lower limb Follow-up Appointments Return Appointment in 2 weeks. - Tuesday or Thursday Nurse Visit: - 1 week Dressing Change Frequency Do not change entire dressing for one week. - both lower legs. Skin Barriers/Peri-Wound Care Moisturizing lotion - both legs Wound Cleansing May shower with protection. Primary Wound Dressing Wound #6 Right,Proximal,Posterior Lower Leg Skin Substitute Application - Apligraft #2. Leave steri-strips and adaptic in place. Do not remove. Wound #8 Left,Anterior Lower Leg Skin Substitute Application - Apligraft #2. Leave steri-strips and adaptic in place. Do not remove. Secondary Dressing Wound #6  Right,Proximal,Posterior Lower Leg Foam - apply foam for protection to anterior portion of lower leg. Dry Gauze Wound #8 Left,Anterior Lower Leg ABD pad Kerramax - or zetvuit Edema Control 4 layer compression - Bilateral Patient to wear own compression stockings - patient to bring in on next wound care appointment. Avoid standing for long periods of time Elevate legs to the level of the heart or above for 30 minutes daily and/or when sitting, a frequency of: - 3-4 times a  day. Exercise regularly Electronic Signature(s) Signed: 05/05/2019 6:25:25 PM By: Linton Ham MD Signed: 05/05/2019 6:38:14 PM By: Deon Pilling Entered By: Deon Pilling on 05/05/2019 14:18:55 -------------------------------------------------------------------------------- Problem List Details Patient Name: Date of Service: [name]. 05/05/2019 1:00 PM Medical Record [id number] Patient Account Number: [id number] Date of Birth/Sex: Treating RN: 10-14-1955 (63 y.o. [name], [name] Primary Care Provider: [name] Other Clinician: Referring Provider: Treating Provider/Extender:Brihany Butch, [name], [name] in Treatment: 40 Active Problems ICD-10 Evaluated Encounter Code Description Active Date Today Diagnosis L97.212 Non-pressure chronic ulcer of right calf with fat layer 07/29/2018 No Yes exposed E11.622 Type 2 diabetes mellitus with other skin ulcer 07/29/2018 No Yes I87.321 Chronic venous hypertension (idiopathic) with 07/29/2018 No Yes inflammation of right lower extremity S81.811D Laceration without foreign body, right lower leg, 02/14/2019 No Yes subsequent encounter S81.812D Laceration without foreign body, left lower leg, 02/14/2019 No Yes subsequent encounter L03.115 Cellulitis of right lower limb 09/24/2018 No Yes Inactive Problems Resolved Problems Electronic Signature(s) Signed: 05/05/2019 6:25:25 PM By: Linton Ham MD Entered By: Linton Ham on 05/05/2019 14:22:56 -------------------------------------------------------------------------------- Progress Note Details Patient Name: Date of Service: [name] 05/05/2019 1:00 PM Medical Record [id number] Patient Account Number: [id number] Date of Birth/Sex: Treating RN: 1955-12-06 (63 y.o. [name] Primary Care Provider: [name] Other Clinician: Referring Provider: Treating Provider/Extender:Gennell How, [name], [name] in  Treatment: 40 Subjective History of Present Illness (HPI) ADMISSION 07/29/2018 Mr. Ortigoza is a 63 year old man with either prediabetes or diabetes he is on glipizide. In late October to November 2019 he noted a scabbed area on the back of his right calf. He picked this off a few times but it would not heal. He saw his primary physician on 12/5. It was felt at that time that this may actually heal on its own with conservative management. The next visit was on 07/20/2018 noted the area was a lot worse and arranged for his treatment here. He has been topical antibiotics like Neosporin although he stopped using this when the wound looked worse. He is just been covering this with a clean Band-Aid. He is given Bactrim a week ago and he is finishing this currently. It is made some improvement in the surrounding erythema per the patient. He does not have a history of nonhealing wounds. No prior history of wounds on his legs that he had difficulty healing. No prior skin issues. 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 Past medical history; A. fib status post ablation and recent cardioversion he is on Xarelto. He has a history of systolic heart failure, cervical radiculopathy, cardiomyopathy and type 2 diabetes as discussed ABI in the right leg was noncompressible on the right 2/20; the biopsy I did on the patient last week was negative for malignancy. Culture grew Pseudomonas and methicillin sensitive staph aureus. He is on cefdinir 300 twice a day. I will have to make this a 10 day course. He put him in compression. He states that the redness pain and erythema  are a lot better. I suspect the patient has chronic venous insufficiency probably with a secondary cellulitis 2/27; arrives today with copious amounts of drainage coming out of the wound irritating the skin below the wound area. He only renewed the final 3 days of cefdinir today 3/5; came in for a nurse change 3 days ago. Again a lot  of drainage noted. Zinc oxide was applied unfortunately the drainage appears to a pool then he has a string of superficial open areas extending down into the Achilles area and some just below the wound. The wound itself does not look too bad. He is completed the antibiotics although apparently there was separation from the original 7 with the last 3 days. Culture grew a few MSSA and a few Pseudomonas 3/12; not too much difference over the last week. He came in for a dressing change on Monday by our nursing staff. Necrotic debris again over the wound surface. The entire area looks irritated but nontender and I do not think shows obvious evidence of infection. We have not heard anything about the reflux studies. Also notable than in this diabetic man we had noncompressible vessels and although I can feel his pulses easily in his feet I will order arterial studies as well. 3/19-Patient had experience more pain has been dressed with a silver alginate, the wounds all have necrotic debris, very friable with easy bleeding with any kind of surface debridement including with gauze and Anasept and with a #3 curette. His vascular appointments unfortunately were all canceled on account of the virus outbreak resulting in studies only being done for emergent cases. His pulses are easily palpable in the lower extremity. He has open areas below the primary wound where he states the drainage which included purulent material also with some blood pooled and caused breakdown. 3/26; the patient was changed to Santyl with [facility] backing last week. This was largely because the silver alginate was sticking to the wound. He is still having quite a bit of pain. He tells [place] that the reflux studies and arterial studies we had attempted to arrange through vein and vascular are not being booked until mid May. Culture that was done last week showed both Serratia moderate and a few methicillin sensitive staph aureus I  started him on Bactrim DS 1 p.o. twice daily on 3/23 for 7 days. He is here for follow-up. 4/3; patient is on Santyl with Hydrofera Blue. Patient complains of pain. Our intake nurse is noted drainage and some odor. He has completed Bactrim recently for Serratia and a few methicillin sensitive staph aureus. After our staff push hard last week we were able to get venous studies as well as arterial studies. His arterial studies showed on the right great toe pressure of 0.86. On the left ABI at 1.47 TBI of 0.87. On both sides waveforms were triphasic VENOUS STUDIES showed reflux in the femoral vein, popliteal vein great saphenous vein at the saphenofemoral junction and great saphenous vein at the proximal thigh. Also noted to have age-indeterminate superficial vein thrombosis involving the small saphenous vein. Notable that the patient is already on Xarelto for atrial fibrillation. 4/9; the patient has been to see vascular surgery and reviewed by Dr. [name]. He was felt to have only minimal superficial venous reflux in the right great greater saphenous vein. For this reason he was not felt to be a candidate for laser ablation of the right greater saphenous vein. A wedge cushion was suggested to keep his leg elevated.  He does have deep venous reflux on the right. Culture I did last time showed a combination of Serratia Pseudomonas and methicillin sensitive staph aureus. We have been using Hydrofera Blue to the large wound, Santyl to some of the deep punched out satellite lesions distal to it. There is some improvement [id number]; the application for Apligraf was put out for further review for material that we have resubmitted. He has the deep area on the right posterior calf which is large and then 3 small punched out areas beneath this. In general his wounds look a lot better 4/23; the patient has his large original wound and then 2 punched out satellite lesions below it on the right posterior calf. I was  usually able to use Apligraf #1 to cover the full surface area of the larger wound. We continued with Santyl and Hydrofera Blue on the 2 punched-out areas 5/7; patient has his large original wound and 2 punched out satellite lesions below it in the right posterior calf. Apligraf #2 today. Major improvement in the big wound in terms of wound depth. Punched-out [place] wounds have a very healthy looking wound bed. 5/21-Patient comes back for his large right posterior calf wound for which she has been an Apligraf #3 today. Wound depth seems to be better, the punched-out wounds appear to have healthy bed with some bleeding 6/4; right posterior calf wound is much better. Apligraf #4 today. The major wound has no wound depth at all. Only 1 of the satellite lesions is still open. The patient has very high blood pressure coming in today with a diastolic blood pressure of 130 after lying there for a few minutes it was down to 110. He states that is running [id number] diastolic at home. He was high last week as well I thought he was on 50 mg 1/2 tablet twice daily of losartan I told him to double up on that however it turns out he is actually on 50 twice daily. Course he is run out of his medications prematurely. He has a follow-up with his primary's office next Wednesday. 6/18; patient's wounds continue to improve. The small area laterally is just about closed and there is continued epithelialization on the area on the posterior calf. 7/2; we applied his last Apligraf last visit. Early the next week there was a lot of purulent looking drainage that I cultured this grew staph and Pseudomonas however when we had him back for the next visit to 3 days later everything looked a lot better. He did not receive systemic antibiotics. Fortunately we did not have to remove the Apligraf. He has had heart trouble this week he was having an ablation apparently they have discovered a clot in his left atrium they have  changed all his anticoagulants as well as his blood pressure medication. 7/16; using Hydrofera Blue. We are making nice progress towards closure. He has not yet ordered his compression stockings he has his measurements 7/23; dimensions are better. He has a new satellite area superiorly. Both wounds cauterized with silver nitrate. 7/30; absolutely no change in the major wound on the posterior part of the right calf. We have been using Hydrofera Blue for a prolonged period of time and really had a nice improvement but over the last few weeks this is stalled. There is no depth. He arrived in clinic today with a new area on the right anterior tibia which apparently was an "ingrown hair". It is clearly an open area. This looks like a wrap  injury although he was not really aware of it. 8/31; since the patient was last in clinic he was admitted to hospital from 8/17 through 8/22. He had had multiple falls at home including one off a ladder. He was admitted to hospital with mild COVID-19 viral pneumonia. Noted to be hyperkalemic and in acute renal failure. He has chronic systolic heart failure with ejection fraction of 35%. He arrives back in clinic with the original wound on the posterior aspect of the right lower calf looking a lot better. He has eschared areas from falls and lacerations on his anterior knees bilaterally just below the patella and on the mid part of his tibia bilaterally. He seems to have a small area on the right lateral ankle as well 9/10- Patient comes to clinic with a 2 new wounds one on the left anterior shin and one on the right posterior leg both the left knee and right knee areas are healed up. We have been using Santyl to the left anterior leg and silver alginate to right posterior leg wounds. 9/17; the patient is original wound looks good on the posterior right calf. He has traumatic areas on the left anterior shin left anterior patellar tendon and the right mid tibia area.  Except for the right posterior calf all required debridement. We have been using Santyl on these areas and silver alginate posteriorly right calf 9/24; the original wound on the posterior calf on the right looks good. This is healthy. There are traumatic wounds in the left anterior shin, left anterior patella in the right mid tibia area are about the same. He has dusky erythema on the left anterior tibia which led me to give him antibiotics last week. This does not look too much different. This is nontender and I wonder if this is all just venous inflammation. 10/2; the patient has 2 open areas on the right posterior calf both of these look good we have been using silver alginate. He has traumatic wounds on the left and right mid tibia areas. The surface of these wounds is still not ready for closure. We have been using Iodoflex. Finally has areas on the left knee. The latter wounds are all traumatic after a fall 10/9. His original wounds of the right posterior calf are superficial and progressing towards closure. His traumatic wounds on the left anterior tibia and right anterior tibia are considerably improved in terms of the wound surface. He asked me about a skin graft in these areas I do not think this is going to be necessary. Change from Iodosorb to [facility] An. ooThe area on the left knee is just about closed as well 10/16; his original wound on the right posterior calf very superficial looks healthy. He has more recent traumatic wounds on the left anterior tibia and right anterior tibia both of these have better looking surfaces and measuring smaller. Might be beneficial for an advanced treatment product. The area on his left knee has a small superficial scab and we are going to close that when 10/23; his original wound areas on the right posterior calf continue to improve. The more recently traumatic wounds on the left anterior tibia and right anterior tibia both look better in terms  of surfaces. No debridement required. We have been using Hydrofera Blue. He is approved for Apligraf 11/3; Apligraf #1 applied to the left anterior tibia right anterior tibia and to 2 small areas remaining on the right posterior calf which were part of his initial wound area.  11/19; Apligraf #2. Left anterior tibia is healed. Right anterior tibia much better. Only one small area remains on the right posterior calf which was part of his initial wound area Objective Constitutional Patient is hypertensive. States his blood pressure is running in the normal range at home however. Pulse regular and within target range for patient.[name] Respirations regular, non-labored and within target range.. Temperature is normal and within the target range for the patient.[name] Appears in no distress. Vitals Time Taken: 1:45 PM, Height: 73 in, Weight: 290 lbs, BMI: 38.3, Temperature: 97.7 F, Pulse: 72 bpm, Respiratory Rate: 18 breaths/min, Blood Pressure: 181/99 mmHg. Eyes Conjunctivae clear. No discharge.no icterus. General Notes: Wound exam; right anterior tibia is healed. Left anterior tibia looks healthy. Small remaining wound on the posterior right calf. Apligraf #2 applied to all remaining wound areas Integumentary (Hair, Skin) No evidence of infection around any wound area. Wound #6 status is Open. Original cause of wound was Gradually Appeared. The wound is located on the Right,Proximal,Posterior Lower Leg. The wound measures 0.3cm length x 0.2cm width x 0.1cm depth; 0.047cm^2 area and 0.005cm^3 volume. There is Fat Layer (Subcutaneous Tissue) Exposed exposed. There is no tunneling or undermining noted. There is a medium amount of serosanguineous drainage noted. The wound margin is flat and intact. There is large (67-100%) red granulation within the wound bed. There is no necrotic tissue within the wound bed. Wound #7 status is Open. Original cause of wound was Trauma. The wound is located on the  Right,Anterior Lower Leg. The wound measures 0cm length x 0cm width x 0cm depth; 0cm^2 area and 0cm^3 volume. There is no tunneling or undermining noted. There is a none present amount of drainage noted. The wound margin is flat and intact. There is no granulation within the wound bed. There is no necrotic tissue within the wound bed. Wound #8 status is Open. Original cause of wound was Gradually Appeared. The wound is located on the Left,Anterior Lower Leg. The wound measures 5.2cm length x 2.1cm width x 0.1cm depth; 8.577cm^2 area and 0.858cm^3 volume. There is no tunneling or undermining noted. There is a medium amount of serosanguineous drainage noted. The wound margin is distinct with the outline attached to the wound base. There is large (67-100%) red, hyper - granulation within the wound bed. There is no necrotic tissue within the wound bed. Assessment Active Problems ICD-10 Non-pressure chronic ulcer of right calf with fat layer exposed Type 2 diabetes mellitus with other skin ulcer Chronic venous hypertension (idiopathic) with inflammation of right lower extremity Laceration without foreign body, right lower leg, subsequent encounter Laceration without foreign body, left lower leg, subsequent encounter Cellulitis of right lower limb Procedures Wound #6 Pre-procedure diagnosis of Wound #6 is a Venous Leg Ulcer located on the Right,Proximal,Posterior Lower Leg. A skin graft procedure using a bioengineered skin substitute/cellular or tissue based product was performed by Ricard Dillon., MD with the following instrument(s): Forceps and Scissors. Apligraf was applied and secured with Steri-Strips and adaptic. 11 sq cm of product was utilized and 11 sq cm was wasted due to wound size. Post Application, no dressing was applied. A Time Out was conducted at 14:14, prior to the start of the procedure. The procedure was tolerated well with a pain level of 0 throughout and a pain level of 0  following the procedure. Post procedure Diagnosis Wound #6: Same as Pre-Procedure . Wound #8 Pre-procedure diagnosis of Wound #8 is a Venous Leg Ulcer located on the Left,Anterior Lower Leg.  A skin graft procedure using a bioengineered skin substitute/cellular or tissue based product was performed by Ricard Dillon., MD. Apligraf was applied and secured with Steri-Strips and adaptic. 22 sq cm of product was utilized and 0 sq cm was wasted due to wound size. Post Application, no dressing was applied. A Time Out was conducted at 14:14, prior to the start of the procedure. The procedure was tolerated well with a pain level of 0 throughout and a pain level of 0 following the procedure. Post procedure Diagnosis Wound #8: Same as Pre-Procedure . Plan Follow-up Appointments: Return Appointment in 2 weeks. - Tuesday or Thursday Nurse Visit: - 1 week Dressing Change Frequency: Do not change entire dressing for one week. - both lower legs. Skin Barriers/Peri-Wound Care: Moisturizing lotion - both legs Wound Cleansing: May shower with protection. Primary Wound Dressing: Wound #6 Right,Proximal,Posterior Lower Leg: Skin Substitute Application - Apligraft #2. Leave steri-strips and adaptic in place. Do not remove. Wound #8 Left,Anterior Lower Leg: Skin Substitute Application - Apligraft #2. Leave steri-strips and adaptic in place. Do not remove. Secondary Dressing: Wound #6 Right,Proximal,Posterior Lower Leg: Foam - apply foam for protection to anterior portion of lower leg. Dry Gauze Wound #8 Left,Anterior Lower Leg: ABD pad Kerramax - or zetvuit Edema Control: 4 layer compression - Bilateral Patient to wear own compression stockings - patient to bring in on next wound care appointment. Avoid standing for long periods of time Elevate legs to the level of the heart or above for 30 minutes daily and/or when sitting, a frequency of: - 3-4 times a day. Exercise regularly 1. Skin  substitute Apligraf #2 applied in the standard fashion 2. I have asked him to bring his stockings in 2 weeks. High expectation the right leg will to totally be healed Electronic Signature(s) Signed: 05/05/2019 6:25:25 PM By: Linton Ham MD Entered By: Linton Ham on 05/05/2019 14:26:55 -------------------------------------------------------------------------------- SuperBill Details Patient Name: Date of Service: [name] 05/05/2019 Medical Record [id number] Patient Account Number: [id number] Date of Birth/Sex: Treating RN: 19-Nov-1955 (63 y.o. [name], Tammi Klippel Primary Care Provider: [name] Other Clinician: Referring Provider: Treating Provider/Extender:Shalin Vonbargen, [name], [name] in Treatment: 40 Diagnosis Coding ICD-10 Codes Code Description [phone] Non-pressure chronic ulcer of right calf with fat layer exposed E11.622 Type 2 diabetes mellitus with other skin ulcer I87.321 Chronic venous hypertension (idiopathic) with inflammation of right lower extremity S81.811D Laceration without foreign body, right lower leg, subsequent encounter S81.812D Laceration without foreign body, left lower leg, subsequent encounter L03.115 Cellulitis of right lower limb Facility Procedures CPT4 Code: [id number] Description: (Facility Use Only) Apligraf 1 SQ CM Modifier: Quantity: [place] Code: [id number] Description: [id number] - SKIN SUB GRAFT TRNK/ARM/LEG ICD-10 Diagnosis Description E11.622 Type 2 diabetes mellitus with other skin ulcer Modifier: Quantity: 1 Physician Procedures CPT4 Code Description: [id number] - WC PHYS SKIN SUB GRAFT TRNK/ARM/LEG ICD-10 Diagnosis Description E11.622 Type 2 diabetes mellitus with other skin ulcer Modifier: Quantity: 1 Electronic Signature(s) Signed: 05/05/2019 6:25:25 PM By: Linton Ham MD Entered By: Linton Ham on 05/05/2019 14:27:05

## 2019-05-06 ENCOUNTER — Other Ambulatory Visit: Payer: Self-pay | Admitting: Internal Medicine

## 2019-05-06 ENCOUNTER — Ambulatory Visit: Payer: BLUE CROSS/BLUE SHIELD

## 2019-05-06 DIAGNOSIS — Z1159 Encounter for screening for other viral diseases: Secondary | ICD-10-CM

## 2019-05-09 LAB — SARS CORONAVIRUS 2 (TAT 6-24 HRS): SARS Coronavirus 2: NEGATIVE

## 2019-05-10 ENCOUNTER — Ambulatory Visit (AMBULATORY_SURGERY_CENTER): Payer: BLUE CROSS/BLUE SHIELD | Admitting: Internal Medicine

## 2019-05-10 ENCOUNTER — Encounter: Payer: Self-pay | Admitting: Internal Medicine

## 2019-05-10 ENCOUNTER — Other Ambulatory Visit: Payer: Self-pay

## 2019-05-10 VITALS — BP 132/84 | HR 63 | Temp 98.4°F | Resp 12 | Ht 73.0 in | Wt 288.0 lb

## 2019-05-10 DIAGNOSIS — Z860100 Personal history of colon polyps, unspecified: Secondary | ICD-10-CM

## 2019-05-10 DIAGNOSIS — D123 Benign neoplasm of transverse colon: Secondary | ICD-10-CM

## 2019-05-10 DIAGNOSIS — D124 Benign neoplasm of descending colon: Secondary | ICD-10-CM | POA: Diagnosis not present

## 2019-05-10 DIAGNOSIS — Z1211 Encounter for screening for malignant neoplasm of colon: Secondary | ICD-10-CM | POA: Diagnosis not present

## 2019-05-10 DIAGNOSIS — D122 Benign neoplasm of ascending colon: Secondary | ICD-10-CM

## 2019-05-10 DIAGNOSIS — Z800 Family history of malignant neoplasm of digestive organs: Secondary | ICD-10-CM

## 2019-05-10 MED ORDER — SODIUM CHLORIDE 0.9 % IV SOLN: 500.0000 mL | INTRAVENOUS | Status: DC

## 2019-05-10 NOTE — Progress Notes (Signed)
Called to room to assist during endoscopic procedure.  Patient ID and intended procedure confirmed with present staff. Received instructions for my participation in the procedure from the performing physician.  

## 2019-05-10 NOTE — Op Note (Signed)
[place] Patient Name: [name] Procedure Date: 05/10/2019 2:15 PM MRN: [name] Endoscopist: Docia Chuck. Henrene Pastor , MD Age: 63 Referring MD:  Date of Birth: [birth year] Gender: Male Account #: [id number] Procedure:                Colonoscopy with cold snare polypectomy x 4 Indications:              High risk colon cancer surveillance: Personal                            history of multiple (3 or more) adenomas. Previous                            examinations 2009, 2016 Medicines:                Monitored Anesthesia Care Procedure:                Pre-Anesthesia Assessment:                           - Prior to the procedure, a History and Physical                            was performed, and patient medications and                            allergies were reviewed. The patient's tolerance of                            previous anesthesia was also reviewed. The risks                            and benefits of the procedure and the sedation                            options and risks were discussed with the patient.                            All questions were answered, and informed consent                            was obtained. Prior Anticoagulants: The patient has                            taken Pradaxa (dabigatran), last dose was 3 days                            prior to procedure. ASA Grade Assessment: III - A                            patient with severe systemic disease. After                            reviewing the risks and benefits, the patient was  deemed in satisfactory condition to undergo the                            procedure.                           After obtaining informed consent, the colonoscope                            was passed under direct vision. Throughout the                            procedure, the patient's blood pressure, pulse, and                            oxygen saturations were monitored  continuously. The                            Colonoscope was introduced through the anus and                            advanced to the the cecum, identified by                            appendiceal orifice and ileocecal valve. The                            ileocecal valve, appendiceal orifice, and rectum                            were photographed. The quality of the bowel                            preparation was good. The colonoscopy was performed                            without difficulty. The patient tolerated the                            procedure well. The bowel preparation used was                            SUPREP via split dose instruction. Scope In: 2:16:58 PM Scope Out: 2:35:48 PM Scope Withdrawal Time: 0 hours 12 minutes 48 seconds  Total Procedure Duration: 0 hours 18 minutes 50 seconds  Findings:                 Four polyps were found in the descending colon,                            transverse colon and ascending colon. The polyps                            were 3 to 5 mm in size. These polyps were removed  with a cold snare. Resection and retrieval were                            complete.                           Multiple small and large-mouthed diverticula were                            found in the entire colon.                           Internal hemorrhoids were found during retroflexion.                           The exam was otherwise without abnormality on                            direct and retroflexion views. Complications:            No immediate complications. Estimated blood loss:                            None. Estimated Blood Loss:     Estimated blood loss: none. Impression:               - Four 3 to 5 mm polyps in the descending colon, in                            the transverse colon and in the ascending colon,                            removed with a cold snare. Resected and retrieved.                            - Diverticulosis in the entire examined colon.                           - Internal hemorrhoids.                           - The examination was otherwise normal on direct                            and retroflexion views. Recommendation:           - Repeat colonoscopy in 3 years for surveillance.                           - Resume Pradaxa (dabigatran) today at prior dose.                           - Patient has a contact number available for                            emergencies. The signs and symptoms of potential  delayed complications were discussed with the                            patient. Return to normal activities tomorrow.                            Written discharge instructions were provided to the                            patient.                           - Resume previous diet.                           - Continue present medications.                           - Await pathology results. Docia Chuck. Henrene Pastor, MD 05/10/2019 2:50:50 PM This report has been signed electronically.

## 2019-05-10 NOTE — Patient Instructions (Signed)
Handouts given: hemorrhoids, diverticulosis, polyps                               Resume previous diet                               Continue present medications and resume pradaxa today                               Repeat colonoscopy in 3 years    YOU HAD AN ENDOSCOPIC PROCEDURE TODAY AT [facility]:   Refer to the procedure report that was given to you for any specific questions about what was found during the examination.  If the procedure report does not answer your questions, please call your gastroenterologist to clarify.  If you requested that your care partner not be given the details of your procedure findings, then the procedure report has been included in a sealed envelope for you to review at your convenience later.  YOU SHOULD EXPECT: Some feelings of bloating in the abdomen. Passage of more gas than usual.  Walking can help get rid of the air that was put into your GI tract during the procedure and reduce the bloating. If you had a lower endoscopy (such as a colonoscopy or flexible sigmoidoscopy) you may notice spotting of blood in your stool or on the toilet paper. If you underwent a bowel prep for your procedure, you may not have a normal bowel movement for a few days.  Please Note:  You might notice some irritation and congestion in your nose or some drainage.  This is from the oxygen used during your procedure.  There is no need for concern and it should clear up in a day or so.  SYMPTOMS TO REPORT IMMEDIATELY:   Following lower endoscopy (colonoscopy or flexible sigmoidoscopy):  Excessive amounts of blood in the stool  Significant tenderness or worsening of abdominal pains  Swelling of the abdomen that is new, acute  Fever of 100F or higher   For urgent or emergent issues, a gastroenterologist can be reached at any hour by calling [phone].   DIET:  We do recommend a small meal at first, but then you may proceed to your regular diet.  Drink plenty  of fluids but you should avoid alcoholic beverages for 24 hours.  ACTIVITY:  You should plan to take it easy for the rest of today and you should NOT DRIVE or use heavy machinery until tomorrow (because of the sedation medicines used during the test).    FOLLOW UP: Our staff will call the number listed on your records 48-72 hours following your procedure to check on you and address any questions or concerns that you may have regarding the information given to you following your procedure. If we do not reach you, we will leave a message.  We will attempt to reach you two times.  During this call, we will ask if you have developed any symptoms of COVID 19. If you develop any symptoms (ie: fever, flu-like symptoms, shortness of breath, cough etc.) before then, please call [phone].  If you test positive for Covid 19 in the 2 weeks post procedure, please call and report this information to [place].    If any biopsies were taken you will be contacted by phone  or by letter within the next 1-3 weeks.  Please call us at [phone] if you have not heard about the biopsies in 3 weeks.    SIGNATURES/CONFIDENTIALITY: You and/or your care partner have signed paperwork which will be entered into your electronic medical record.  These signatures attest to the fact that that the information above on your After Visit Summary has been reviewed and is understood.  Full responsibility of the confidentiality of this discharge information lies with you and/or your care-partner.

## 2019-05-10 NOTE — Progress Notes (Signed)
A/ox3, pleased with MAC, report to RN 

## 2019-05-10 NOTE — Progress Notes (Signed)
[place]

## 2019-05-11 ENCOUNTER — Encounter (HOSPITAL_BASED_OUTPATIENT_CLINIC_OR_DEPARTMENT_OTHER): Payer: BLUE CROSS/BLUE SHIELD

## 2019-05-11 DIAGNOSIS — L03115 Cellulitis of right lower limb: Secondary | ICD-10-CM | POA: Diagnosis not present

## 2019-05-11 DIAGNOSIS — I5022 Chronic systolic (congestive) heart failure: Secondary | ICD-10-CM | POA: Diagnosis not present

## 2019-05-11 DIAGNOSIS — X58XXXA Exposure to other specified factors, initial encounter: Secondary | ICD-10-CM | POA: Diagnosis not present

## 2019-05-11 DIAGNOSIS — Z7901 Long term (current) use of anticoagulants: Secondary | ICD-10-CM | POA: Diagnosis not present

## 2019-05-11 DIAGNOSIS — I4891 Unspecified atrial fibrillation: Secondary | ICD-10-CM | POA: Diagnosis not present

## 2019-05-11 DIAGNOSIS — I110 Hypertensive heart disease with heart failure: Secondary | ICD-10-CM | POA: Diagnosis not present

## 2019-05-11 DIAGNOSIS — Z8619 Personal history of other infectious and parasitic diseases: Secondary | ICD-10-CM | POA: Diagnosis not present

## 2019-05-11 DIAGNOSIS — E11622 Type 2 diabetes mellitus with other skin ulcer: Secondary | ICD-10-CM | POA: Diagnosis not present

## 2019-05-11 DIAGNOSIS — I872 Venous insufficiency (chronic) (peripheral): Secondary | ICD-10-CM | POA: Diagnosis not present

## 2019-05-11 DIAGNOSIS — J45909 Unspecified asthma, uncomplicated: Secondary | ICD-10-CM | POA: Diagnosis not present

## 2019-05-11 DIAGNOSIS — G4730 Sleep apnea, unspecified: Secondary | ICD-10-CM | POA: Diagnosis not present

## 2019-05-11 DIAGNOSIS — M5412 Radiculopathy, cervical region: Secondary | ICD-10-CM | POA: Diagnosis not present

## 2019-05-11 DIAGNOSIS — S81811A Laceration without foreign body, right lower leg, initial encounter: Secondary | ICD-10-CM | POA: Diagnosis not present

## 2019-05-11 DIAGNOSIS — L97212 Non-pressure chronic ulcer of right calf with fat layer exposed: Secondary | ICD-10-CM | POA: Diagnosis not present

## 2019-05-11 DIAGNOSIS — I429 Cardiomyopathy, unspecified: Secondary | ICD-10-CM | POA: Diagnosis not present

## 2019-05-11 NOTE — Progress Notes (Signed)
ALVAN, ERSTAD ([name]) Visit Report for 05/11/2019 SuperBill Details Patient Name: Date of Service: TOM, FRIEDERS 05/11/2019 Medical Record [name] Patient Account Number: [id number] Date of Birth/Sex: Treating RN: July 13, 1955 (63 y.o. [name], Tammi Klippel Primary Care Provider: [name] Other Clinician: Referring Provider: Treating Provider/Extender:Stone III, Alla German, [name] in Treatment: 40 Diagnosis Coding ICD-10 Codes Code Description [phone] Non-pressure chronic ulcer of right calf with fat layer exposed E11.622 Type 2 diabetes mellitus with other skin ulcer I87.321 Chronic venous hypertension (idiopathic) with inflammation of right lower extremity S81.811D Laceration without foreign body, right lower leg, subsequent encounter S81.812D Laceration without foreign body, left lower leg, subsequent encounter L03.115 Cellulitis of right lower limb Facility Procedures CPT4 Description Modifier Quantity Code [id number] BILATERAL: Application of multi-layer venous compression system; leg 1 (below knee), including ankle and foot. Electronic Signature(s) Signed: 05/11/2019 3:16:44 PM By: Deon Pilling Signed: 05/11/2019 4:52:33 PM By: Worthy Keeler PA-C Entered By: Deon Pilling on 05/11/2019 13:14:44

## 2019-05-11 NOTE — Progress Notes (Signed)
RUPINDER, THERRELL ([id number]) Visit Report for 05/11/2019 Arrival Information Details Patient Name: Date of Service: [name], [name] 05/11/2019 1:15 PM Medical Record [id number] Patient Account Number: [id number] Date of Birth/Sex: Treating RN: 1956-05-03 (63 y.o. [name] Primary Care Uma Jerde: [name] Other Clinician: Referring Kaaren Nass: Treating Kendall Justo/Extender:Stone III, Alla German, [name] in Treatment: 14 Visit Information History Since Last Visit Added or deleted any medications: No Patient Arrived: Ambulatory Any new allergies or adverse reactions: No Arrival Time: 13:00 Had a fall or experienced change in No Accompanied By: self activities of daily living that may affect Transfer Assistance: None risk of falls: Patient Identification Verified: Yes Signs or symptoms of abuse/neglect since last No Secondary Verification Process Yes visito Completed: Hospitalized since last visit: No Patient Requires Transmission- No Implantable device outside of the clinic excluding No Based Precautions: cellular tissue based products placed in the center Patient Has Alerts: Yes Patient on Blood since last visit: Patient Alerts: Has Dressing in Place as Prescribed: Yes Thinner Has Compression in Place as Prescribed: Yes xarelto R TBI = .86, L TBI Pain Present Now: No = .87 Electronic Signature(s) Signed: 05/11/2019 3:16:44 PM By: Deon Pilling Entered By: Deon Pilling on 05/11/2019 13:10:44 -------------------------------------------------------------------------------- Compression Therapy Details Patient Name: Date of Service: [name], [name] 05/11/2019 1:15 PM Medical Record [id number] Patient Account Number: [id number] Date of Birth/Sex: Treating RN: 1955/09/28 (63 y.o. [name] Primary Care Levy Wellman: [name] Other Clinician: Referring Neriyah Cercone: Treating Argenis Kumari/Extender:Stone III, Alla German,  [name] in Treatment: 40 Compression Therapy Performed for Wound Wound #6 Right,Proximal,Posterior Lower Leg Assessment: Performed By: Baker Janus, RN Compression Type: Four Layer Pre Treatment ABI: 0.9 Electronic Signature(s) Signed: 05/11/2019 3:16:44 PM By: Deon Pilling Entered By: Deon Pilling on 05/11/2019 13:12:26 -------------------------------------------------------------------------------- Compression Therapy Details Patient Name: Date of Service: [name], [name] 05/11/2019 1:15 PM Medical Record [id number] Patient Account Number: [id number] Date of Birth/Sex: Treating RN: [birth year] (63 y.o. [name] Primary Care Kavin Weckwerth: [name] Other Clinician: Referring Myreon Wimer: Treating Adylee Leonardo/Extender:Stone III, Alla German, [name] in Treatment: 40 Compression Therapy Performed for Wound Wound #8 Left,Anterior Lower Leg Assessment: Performed By: Baker Janus, RN Compression Type: Four Layer Pre Treatment ABI: 0.9 Electronic Signature(s) Signed: 05/11/2019 3:16:44 PM By: Deon Pilling Entered By: Deon Pilling on 05/11/2019 13:12:27 -------------------------------------------------------------------------------- Encounter Discharge Information Details Patient Name: Date of Service: [name] 05/11/2019 1:15 PM Medical Record [id number] Patient Account Number: [id number] Date of Birth/Sex: Treating RN: 1956/03/02 (63 y.o. [name] Primary Care Jamita Mckelvin: [name] Other Clinician: Referring Zeta Bucy: Treating Laelynn Blizzard/Extender:Stone III, Alla German, [name] in Treatment: 30 Encounter Discharge Information Items Discharge Condition: Stable Ambulatory Status: Ambulatory Discharge Destination: Home Transportation: Private Auto Accompanied By: self Schedule Follow-up Appointment: Yes Clinical Summary of Care: Electronic Signature(s) Signed: 05/11/2019  3:16:44 PM By: Deon Pilling Entered By: Deon Pilling on 05/11/2019 13:14:36 -------------------------------------------------------------------------------- Patient/Caregiver Education Details Patient Name: [name] 11/25/2020andnbsp1:15 Date of Service: PM Medical Record [id number] Number: Patient Account Number: [id number] Treating RN: 03/14/1956 (63 y.o. Deon Pilling Date of Birth/Gender: M) Other Clinician: Primary Care Physician: [name] Treating Worthy Keeler Referring Physician: Physician/Extender: Elicia Lamp in Treatment: 21 Education Assessment Education Provided To: Patient Education Topics Provided Venous: Handouts: Managing Venous Disease and Related Ulcers Methods: Explain/Verbal Responses: Reinforcements needed Electronic Signature(s) Signed: 05/11/2019 3:16:44 PM By: Deon Pilling Entered By: Deon Pilling on 05/11/2019 13:14:21 -------------------------------------------------------------------------------- Wound Assessment Details Patient Name: Date of Service: [name], HEDQUIST. 05/11/2019 1:15 PM Medical  Record [id number] Patient Account Number: [id number] Date of Birth/Sex: Treating RN: Feb 12, 1956 (63 y.o. [name], Tammi Klippel Primary Care Aadyn Buchheit: [name] Other Clinician: Referring Glorene Leitzke: Treating Fortino Haag/Extender:Stone III, Alla German, [name] in Treatment: 40 Wound Status Wound Number: 6 Primary Etiology: Venous Leg Ulcer Wound Location: Right, Proximal, Posterior Lower Wound Status: Open Leg Wounding Event: Gradually Appeared Date Acquired: 01/06/2019 Weeks Of Treatment: 17 Clustered Wound: No Wound Measurements Length: (cm) 0.3 Width: (cm) 0.2 Depth: (cm) 0.1 Area: (cm) 0.047 Volume: (cm) 0.005 Wound Description Full Thickness Without Exposed Support Classification: Structures % Reduction in Area: 88% % Reduction in Volume: 87.2% Treatment Notes Wound #6  (Right, Proximal, Posterior Lower Leg) 1. Cleanse With Soap and water 2. Periwound Care Moisturizing lotion 3. Primary Dressing Applied Cellular Based Tissue Product Other primary dressing (specifiy in notes) 4. Secondary Dressing ABD Pad Dry Gauze 6. Support Layer Applied 4 layer compression wrap Notes netting. left skin sub, adaptic, and steri-strips in place. foam applied to anterior portion of lower leg. Electronic Signature(s) Signed: 05/11/2019 3:16:44 PM By: Deon Pilling Entered By: Deon Pilling on 05/11/2019 13:11:43 -------------------------------------------------------------------------------- Wound Assessment Details Patient Name: Date of Service: [name], [name] 05/11/2019 1:15 PM Medical Record [name] Patient Account Number: [id number] Date of Birth/Sex: Treating RN: 09-28-1955 (63 y.o. [name], Tammi Klippel Primary Care Murvin Gift: [name] Other Clinician: Referring Jazzie Trampe: Treating Zykerria Tanton/Extender:Stone III, Alla German, [name] in Treatment: 40 Wound Status Wound Number: 8 Primary Etiology: Venous Leg Ulcer Wound Location: Left, Anterior Lower Leg Wound Status: Open Wounding Event: Gradually Appeared Date Acquired: 02/14/2019 Weeks Of Treatment: 12 Clustered Wound: No Wound Measurements Length: (cm) 5.2 Width: (cm) 2.1 Depth: (cm) 0.1 Area: (cm) 8.577 Volume: (cm) 0.858 Wound Description Classification: Full Thickness Without Exposed Support Structures % Reduction in Area: -36.5% % Reduction in Volume: -36.6% Treatment Notes Wound #8 (Left, Anterior Lower Leg) 1. Cleanse With Soap and water 2. Periwound Care Moisturizing lotion 3. Primary Dressing Applied Cellular Based Tissue Product Other primary dressing (specifiy in notes) 4. Secondary Dressing ABD Pad Dry Gauze 6. Support Layer Applied 4 layer compression wrap Notes netting. left skin sub, adaptic, and steri-strips in place. Electronic  Signature(s) Signed: 05/11/2019 3:16:44 PM By: Deon Pilling Entered By: Deon Pilling on 05/11/2019 13:11:44 -------------------------------------------------------------------------------- Vitals Details Patient Name: Date of Service: [name] 05/11/2019 1:15 PM Medical Record [id number] Patient Account Number: [id number] Date of Birth/Sex: Treating RN: 24-Mar-1956 (63 y.o. [name] Primary Care Monie Shere: [name] Other Clinician: Referring Henrick Mcgue: Treating Edge Mauger/Extender:Stone III, Alla German, [name] in Treatment: 40 Vital Signs Time Taken: 13:10 Temperature (F): 98 Height (in): 73 Pulse (bpm): 64 Weight (lbs): 290 Respiratory Rate (breaths/min): 20 Body Mass Index (BMI): 38.3 Blood Pressure (mmHg): 123/83 Reference Range: 80 - 120 mg / dl Electronic Signature(s) Signed: 05/11/2019 3:16:44 PM By: Deon Pilling Entered By: Deon Pilling on 05/11/2019 13:13:45

## 2019-05-16 ENCOUNTER — Telehealth: Payer: Self-pay
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
## 2019-05-16 NOTE — Telephone Encounter (Signed)
  Follow up Call-  Call back number 05/10/2019  Post procedure Call Back phone  # [phone] Cell  Permission to leave phone message Yes  Some recent data might be hidden     Patient questions:  Do you have a fever, pain , or abdominal swelling? No. Pain Score  0 *  Have you tolerated food without any problems? Yes.    Have you been able to return to your normal activities? Yes.    Do you have any questions about your discharge instructions: Diet   No. Medications  No. Follow up visit  No.  Do you have questions or concerns about your Care? No.  Actions: * If pain score is 4 or above: 1. No action needed, pain <4.Have you developed a fever since your procedure? no  2.   Have you had an respiratory symptoms (SOB or cough) since your procedure? no  3.   Have you tested positive for COVID 19 since your procedure no  4.   Have you had any family members/close contacts diagnosed with the COVID 19 since your procedure?  no   If yes to any of these questions please route to Joylene John, RN and Alphonsa Gin, Therapist, sports.

## 2019-05-16 NOTE — Telephone Encounter (Signed)
1st follow up call made.  NALM 

## 2019-05-18 ENCOUNTER — Other Ambulatory Visit: Payer: Self-pay

## 2019-05-18 ENCOUNTER — Ambulatory Visit: Payer: BLUE CROSS/BLUE SHIELD | Admitting: Physician Assistant

## 2019-05-18 ENCOUNTER — Encounter: Payer: Self-pay | Admitting: Internal Medicine

## 2019-05-18 ENCOUNTER — Telehealth: Payer: BLUE CROSS/BLUE SHIELD | Admitting: Internal Medicine

## 2019-05-18 VITALS — BP 160/104 | HR 58 | Ht 72.0 in | Wt 294.0 lb

## 2019-05-18 DIAGNOSIS — I10 Essential (primary) hypertension: Secondary | ICD-10-CM

## 2019-05-18 DIAGNOSIS — I5022 Chronic systolic (congestive) heart failure: Secondary | ICD-10-CM

## 2019-05-18 DIAGNOSIS — I4811 Longstanding persistent atrial fibrillation: Secondary | ICD-10-CM

## 2019-05-18 DIAGNOSIS — I428 Other cardiomyopathies: Secondary | ICD-10-CM | POA: Diagnosis not present

## 2019-05-18 NOTE — Patient Instructions (Signed)
Medication Instructions:   Your physician recommends that you continue on your current medications as directed. Please refer to the Current Medication list given to you today.  *If you need a refill on your cardiac medications before your next appointment, please call your pharmacy*  Lab Work: NONE ORDERED  TODAY'  If you have labs (blood work) drawn today and your tests are completely normal, you will receive your results only by: [name] MyChart Message (if you have MyChart) OR . A paper copy in the mail If you have any lab test that is abnormal or we need to change your treatment, we will call you to review the results.  Testing/Procedures: NONE ORDERED  TODAY    Follow-Up: At [facility], you and your health needs are our priority.  As part of our continuing mission to provide you with exceptional heart care, we have created designated Provider Care Teams.  These Care Teams include your primary Cardiologist (physician) and Advanced Practice Providers (APPs -  Physician Assistants and Nurse Practitioners) who all work together to provide you with the care you need, when you need it.  Your next appointment:   6 month(s)  The format for your next appointment:   In Person  Provider:   You may see Thompson Grayer, MD or one of the following Advanced Practice Providers on your designated Care Team:    Chanetta Marshall, NP  Tommye Standard, PA-C  Legrand Como "Oda Kilts, [place]   Other Instructions

## 2019-05-19 ENCOUNTER — Encounter (HOSPITAL_BASED_OUTPATIENT_CLINIC_OR_DEPARTMENT_OTHER): Payer: BLUE CROSS/BLUE SHIELD | Attending: Internal Medicine | Admitting: Internal Medicine

## 2019-05-19 DIAGNOSIS — G4730 Sleep apnea, unspecified: Secondary | ICD-10-CM | POA: Insufficient documentation

## 2019-05-19 DIAGNOSIS — I5022 Chronic systolic (congestive) heart failure: Secondary | ICD-10-CM | POA: Insufficient documentation

## 2019-05-19 DIAGNOSIS — E1151 Type 2 diabetes mellitus with diabetic peripheral angiopathy without gangrene: Secondary | ICD-10-CM | POA: Diagnosis not present

## 2019-05-19 DIAGNOSIS — I110 Hypertensive heart disease with heart failure: Secondary | ICD-10-CM | POA: Insufficient documentation

## 2019-05-19 DIAGNOSIS — I4891 Unspecified atrial fibrillation: Secondary | ICD-10-CM | POA: Diagnosis not present

## 2019-05-19 DIAGNOSIS — E11622 Type 2 diabetes mellitus with other skin ulcer: Secondary | ICD-10-CM | POA: Insufficient documentation

## 2019-05-19 DIAGNOSIS — L03115 Cellulitis of right lower limb: Secondary | ICD-10-CM | POA: Insufficient documentation

## 2019-05-19 DIAGNOSIS — J45909 Unspecified asthma, uncomplicated: Secondary | ICD-10-CM | POA: Diagnosis not present

## 2019-05-19 DIAGNOSIS — E11621 Type 2 diabetes mellitus with foot ulcer: Secondary | ICD-10-CM | POA: Diagnosis not present

## 2019-05-19 DIAGNOSIS — Z7901 Long term (current) use of anticoagulants: Secondary | ICD-10-CM | POA: Insufficient documentation

## 2019-05-19 DIAGNOSIS — I429 Cardiomyopathy, unspecified: Secondary | ICD-10-CM | POA: Diagnosis not present

## 2019-05-19 DIAGNOSIS — L97822 Non-pressure chronic ulcer of other part of left lower leg with fat layer exposed: Secondary | ICD-10-CM | POA: Diagnosis not present

## 2019-05-19 DIAGNOSIS — E875 Hyperkalemia: Secondary | ICD-10-CM | POA: Insufficient documentation

## 2019-05-19 DIAGNOSIS — M5412 Radiculopathy, cervical region: Secondary | ICD-10-CM | POA: Insufficient documentation

## 2019-05-19 DIAGNOSIS — L97212 Non-pressure chronic ulcer of right calf with fat layer exposed: Secondary | ICD-10-CM | POA: Insufficient documentation

## 2019-05-19 DIAGNOSIS — Z8619 Personal history of other infectious and parasitic diseases: Secondary | ICD-10-CM | POA: Insufficient documentation

## 2019-05-19 DIAGNOSIS — I87313 Chronic venous hypertension (idiopathic) with ulcer of bilateral lower extremity: Secondary | ICD-10-CM | POA: Diagnosis not present

## 2019-05-20 NOTE — Progress Notes (Signed)
[name], [name] ([id number]) Visit Report for 05/19/2019 HPI Details Patient Name: Date of Service: CAVALLI, PACIS 05/19/2019 1:00 PM Medical Record [id number] Patient Account Number: [id number] Date of Birth/Sex: Treating RN: 1956/02/05 (63 y.o. M) Primary Care Provider: [name] Other Clinician: Referring Provider: Treating Provider/Extender:Robson, [name], [name] in Treatment: 23 History of Present Illness HPI Description: ADMISSION 07/29/2018 [name] is a 63 year old man with either prediabetes or diabetes he is on glipizide. In late October to November 2019 he noted a scabbed area on the back of his right calf. He picked this off a few times but it would not heal. He saw his primary physician on 12/5. It was felt at that time that this may actually heal on its own with conservative management. The next visit was on 07/20/2018 noted the area was a lot worse and arranged for his treatment here. He has been topical antibiotics like Neosporin although he stopped using this when the wound looked worse. He is just been covering this with a clean Band-Aid. He is given Bactrim a week ago and he is finishing this currently. It is made some improvement in the surrounding erythema per the patient. He does not have a history of nonhealing wounds. No prior history of wounds on his legs that he had difficulty healing. No prior skin issues. 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 Past medical history; A. fib status post ablation and recent cardioversion he is on Xarelto. He has a history of systolic heart failure, cervical radiculopathy, cardiomyopathy and type 2 diabetes as discussed ABI in the right leg was noncompressible on the right 2/20; the biopsy I did on the patient last week was negative for malignancy. Culture grew Pseudomonas and methicillin sensitive staph aureus. He is on cefdinir 300 twice a day. I will have to make this a 10 day  course. He put him in compression. He states that the redness pain and erythema are a lot better. I suspect the patient has chronic venous insufficiency probably with a secondary cellulitis 2/27; arrives today with copious amounts of drainage coming out of the wound irritating the skin below the wound area. He only renewed the final 3 days of cefdinir today 3/5; came in for a nurse change 3 days ago. Again a lot of drainage noted. Zinc oxide was applied unfortunately the drainage appears to a pool then he has a string of superficial open areas extending down into the Achilles area and some just below the wound. The wound itself does not look too bad. He is completed the antibiotics although apparently there was separation from the original 7 with the last 3 days. Culture grew a few MSSA and a few Pseudomonas 3/12; not too much difference over the last week. He came in for a dressing change on Monday by our nursing staff. Necrotic debris again over the wound surface. The entire area looks irritated but nontender and I do not think shows obvious evidence of infection. We have not heard anything about the reflux studies. Also notable than in this diabetic man we had noncompressible vessels and although I can feel his pulses easily in his feet I will order arterial studies as well. 3/19-Patient had experience more pain has been dressed with a silver alginate, the wounds all have necrotic debris, very friable with easy bleeding with any kind of surface debridement including with gauze and Anasept and with a #3 curette. His vascular appointments unfortunately were all canceled on account of  the virus outbreak resulting in studies only being done for emergent cases. His pulses are easily palpable in the lower extremity. He has open areas below the primary wound where he states the drainage which included purulent material also with some blood pooled and caused breakdown. 3/26; the patient was changed  to Santyl with [facility] backing last week. This was largely because the silver alginate was sticking to the wound. He is still having quite a bit of pain. He tells [place] that the reflux studies and arterial studies we had attempted to arrange through vein and vascular are not being booked until mid May. Culture that was done last week showed both Serratia moderate and a few methicillin sensitive staph aureus I started him on Bactrim DS 1 p.o. twice daily on 3/23 for 7 days. He is here for follow-up. 4/3; patient is on Santyl with Hydrofera Blue. Patient complains of pain. Our intake nurse is noted drainage and some odor. He has completed Bactrim recently for Serratia and a few methicillin sensitive staph aureus. After our staff push hard last week we were able to get venous studies as well as arterial studies. His arterial studies showed on the right great toe pressure of 0.86. On the left ABI at 1.47 TBI of 0.87. On both sides waveforms were triphasic VENOUS STUDIES showed reflux in the femoral vein, popliteal vein great saphenous vein at the saphenofemoral junction and great saphenous vein at the proximal thigh. Also noted to have age-indeterminate superficial vein thrombosis involving the small saphenous vein. Notable that the patient is already on Xarelto for atrial fibrillation. 4/9; the patient has been to see vascular surgery and reviewed by Dr. [name]. He was felt to have only minimal superficial venous reflux in the right great greater saphenous vein. For this reason he was not felt to be a candidate for laser ablation of the right greater saphenous vein. A wedge cushion was suggested to keep his leg elevated. He does have deep venous reflux on the right. Culture I did last time showed a combination of Serratia Pseudomonas and methicillin sensitive staph aureus. We have been using Hydrofera Blue to the large wound, Santyl to some of the deep punched out satellite lesions distal to it.  There is some improvement [id number]; the application for Apligraf was put out for further review for material that we have resubmitted. He has the deep area on the right posterior calf which is large and then 3 small punched out areas beneath this. In general his wounds look a lot better 4/23; the patient has his large original wound and then 2 punched out satellite lesions below it on the right posterior calf. I was usually able to use Apligraf #1 to cover the full surface area of the larger wound. We continued with Santyl and Hydrofera Blue on the 2 punched-out areas 5/7; patient has his large original wound and 2 punched out satellite lesions below it in the right posterior calf. Apligraf #2 today. Major improvement in the big wound in terms of wound depth. Punched-out [place] wounds have a very healthy looking wound bed. 5/21-Patient comes back for his large right posterior calf wound for which she has been an Apligraf #3 today. Wound depth seems to be better, the punched-out wounds appear to have healthy bed with some bleeding 6/4; right posterior calf wound is much better. Apligraf #4 today. The major wound has no wound depth at all. Only 1 of the satellite lesions is still open. The patient has very  high blood pressure coming in today with a diastolic blood pressure of 130 after lying there for a few minutes it was down to 110. He states that is running [id number] diastolic at home. He was high last week as well I thought he was on 50 mg 1/2 tablet twice daily of losartan I told him to double up on that however it turns out he is actually on 50 twice daily. Course he is run out of his medications prematurely. He has a follow-up with his primary's office next Wednesday. 6/18; patient's wounds continue to improve. The small area laterally is just about closed and there is continued epithelialization on the area on the posterior calf. 7/2; we applied his last Apligraf last visit. Early the next week  there was a lot of purulent looking drainage that I cultured this grew staph and Pseudomonas however when we had him back for the next visit to 3 days later everything looked a lot better. He did not receive systemic antibiotics. Fortunately we did not have to remove the Apligraf. He has had heart trouble this week he was having an ablation apparently they have discovered a clot in his left atrium they have changed all his anticoagulants as well as his blood pressure medication. 7/16; using Hydrofera Blue. We are making nice progress towards closure. He has not yet ordered his compression stockings he has his measurements 7/23; dimensions are better. He has a new satellite area superiorly. Both wounds cauterized with silver nitrate. 7/30; absolutely no change in the major wound on the posterior part of the right calf. We have been using Hydrofera Blue for a prolonged period of time and really had a nice improvement but over the last few weeks this is stalled. There is no depth. He arrived in clinic today with a new area on the right anterior tibia which apparently was an "ingrown hair". It is clearly an open area. This looks like a wrap injury although he was not really aware of it. 8/31; since the patient was last in clinic he was admitted to hospital from 8/17 through 8/22. He had had multiple falls at home including one off a ladder. He was admitted to hospital with mild COVID-19 viral pneumonia. Noted to be hyperkalemic and in acute renal failure. He has chronic systolic heart failure with ejection fraction of 35%. He arrives back in clinic with the original wound on the posterior aspect of the right lower calf looking a lot better. He has eschared areas from falls and lacerations on his anterior knees bilaterally just below the patella and on the mid part of his tibia bilaterally. He seems to have a small area on the right lateral ankle as well 9/10- Patient comes to clinic with a 2 new  wounds one on the left anterior shin and one on the right posterior leg both the left knee and right knee areas are healed up. We have been using Santyl to the left anterior leg and silver alginate to right posterior leg wounds. 9/17; the patient is original wound looks good on the posterior right calf. He has traumatic areas on the left anterior shin left anterior patellar tendon and the right mid tibia area. Except for the right posterior calf all required debridement. We have been using Santyl on these areas and silver alginate posteriorly right calf 9/24; the original wound on the posterior calf on the right looks good. This is healthy. There are traumatic wounds in the left anterior shin, left anterior  patella in the right mid tibia area are about the same. He has dusky erythema on the left anterior tibia which led me to give him antibiotics last week. This does not look too much different. This is nontender and I wonder if this is all just venous inflammation. 10/2; the patient has 2 open areas on the right posterior calf both of these look good we have been using silver alginate. He has traumatic wounds on the left and right mid tibia areas. The surface of these wounds is still not ready for closure. We have been using Iodoflex. Finally has areas on the left knee. The latter wounds are all traumatic after a fall 10/9. His original wounds of the right posterior calf are superficial and progressing towards closure. His traumatic wounds on the left anterior tibia and right anterior tibia are considerably improved in terms of the wound surface. He asked me about a skin graft in these areas I do not think this is going to be necessary. Change from Iodosorb to [facility]. The area on the left knee is just about closed as well 10/16; his original wound on the right posterior calf very superficial looks healthy. He has more recent traumatic wounds on the left anterior tibia and right anterior  tibia both of these have better looking surfaces and measuring smaller. Might be beneficial for an advanced treatment product. The area on his left knee has a small superficial scab and we are going to close that when 10/23; his original wound areas on the right posterior calf continue to improve. The more recently traumatic wounds on the left anterior tibia and right anterior tibia both look better in terms of surfaces. No debridement required. We have been using Hydrofera Blue. He is approved for Apligraf 11/3; Apligraf #1 applied to the left anterior tibia right anterior tibia and to 2 small areas remaining on the right posterior calf which were part of his initial wound area. 11/19; Apligraf #2. Left anterior tibia is healed. Right anterior tibia much better. Only one small area remains on the right posterior calf which was part of his initial wound area 12/3; the left anterior tibia has opened up again. Hyper granulated nodule. Right anterior tibia is superficial and larger. There is no depth of this. I did not place Apligraf today return to [facility] under compression Electronic Signature(s) Signed: 05/20/2019 7:57:15 AM By: Linton Ham MD Entered By: Linton Ham on 05/19/2019 14:36:47 -------------------------------------------------------------------------------- Chemical Cauterization Details Patient Name: Date of Service: [name] 05/19/2019 1:00 PM Medical Record [id number] Patient Account Number: [id number] Date of Birth/Sex: Treating RN: [birth year] (63 y.o. [name] Primary Care Provider: [name] Other Clinician: Referring Provider: Treating Provider/Extender:Robson, [name], [name] in Treatment: 42 Procedure Performed for: Wound #8 Left,Anterior Lower Leg Performed By: Physician Ricard Dillon., MD Post Procedure Diagnosis Same as Pre-procedure Notes silver nitrate Electronic Signature(s) Signed:  05/19/2019 6:37:14 PM By: Deon Pilling Signed: 05/20/2019 7:57:15 AM By: Linton Ham MD Entered By: Deon Pilling on 05/19/2019 14:11:28 -------------------------------------------------------------------------------- Physical Exam Details Patient Name: Date of Service: JVION, MIAZGA 05/19/2019 1:00 PM Medical Record [id number] Patient Account Number: [id number] Date of Birth/Sex: Treating RN: September 22, 1955 (63 y.o. M) Primary Care Provider: [name] Other Clinician: Referring Provider: Treating Provider/Extender:Robson, [name], [name] in Treatment: 42 Constitutional Patient is hypertensive.. Pulse regular and within target range for patient.[name] Respirations regular, non-labored and within target range.. Temperature is normal and within the target range for the patient.[name] Appears  in no distress. Eyes Conjunctivae clear. No discharge.no icterus. Respiratory work of breathing is normal. Cardiovascular Pedal pulses palpable and strong bilaterally.. IMA control is good. Integumentary (Hair, Skin) Changes of chronic venous insufficiency. Psychiatric appears at normal baseline. Notes Wound exam; right anterior tibia is healed. Right posterior calf is superficial but larger as. As opposed to 2 weeks ago he now has a raised hyper granulated area on the left anterior tibial area there is no surrounding infection Electronic Signature(s) Signed: 05/20/2019 7:57:15 AM By: Linton Ham MD Entered By: Linton Ham on 05/19/2019 14:41:31 -------------------------------------------------------------------------------- Physician Orders Details Patient Name: Date of Service: [name] 05/19/2019 1:00 PM Medical Record [id number] Patient Account Number: [id number] Date of Birth/Sex: Treating RN: January 26, 1956 (63 y.o. [name], Tammi Klippel Primary Care Provider: [name] Other Clinician: Referring Provider: Treating  Provider/Extender:Robson, [name], [name] in Treatment: [phone] Verbal / Phone Orders: No Diagnosis Coding ICD-10 Coding Code Description [phone] Non-pressure chronic ulcer of right calf with fat layer exposed E11.622 Type 2 diabetes mellitus with other skin ulcer I87.321 Chronic venous hypertension (idiopathic) with inflammation of right lower extremity S81.811D Laceration without foreign body, right lower leg, subsequent encounter S81.812D Laceration without foreign body, left lower leg, subsequent encounter L03.115 Cellulitis of right lower limb Follow-up Appointments Return Appointment in 1 week. Dressing Change Frequency Do not change entire dressing for one week. - both lower legs. Skin Barriers/Peri-Wound Care Moisturizing lotion - both legs Wound Cleansing May shower with protection. Primary Wound Dressing Wound #6 Right,Proximal,Posterior Lower Leg Hydrofera Blue Wound #8 Left,Anterior Lower Leg Hydrofera Blue Secondary Dressing Wound #6 Right,Proximal,Posterior Lower Leg Foam - apply foam for protection to anterior portion of lower leg. Dry Gauze Wound #8 Left,Anterior Lower Leg ABD pad Edema Control 4 layer compression - Bilateral Patient to wear own compression stockings - patient to bring in on next wound care appointment. Avoid standing for long periods of time Elevate legs to the level of the heart or above for 30 minutes daily and/or when sitting, a frequency of: - 3-4 times a day. Exercise regularly Electronic Signature(s) Signed: 05/19/2019 6:37:14 PM By: Deon Pilling Signed: 05/20/2019 7:57:15 AM By: Linton Ham MD Entered By: Deon Pilling on 05/19/2019 14:13:03 -------------------------------------------------------------------------------- Problem List Details Patient Name: Date of Service: [name] 05/19/2019 1:00 PM Medical Record [id number] Patient Account Number: [id number] Date of Birth/Sex: Treating  RN: 03-19-1956 (63 y.o. [name], [name] Primary Care Provider: [name] Other Clinician: Referring Provider: Treating Provider/Extender:Robson, [name], [name] in Treatment: 42 Active Problems ICD-10 Evaluated Encounter Code Description Active Date Today Diagnosis L97.212 Non-pressure chronic ulcer of right calf with fat layer 07/29/2018 No Yes exposed E11.622 Type 2 diabetes mellitus with other skin ulcer 07/29/2018 No Yes I87.321 Chronic venous hypertension (idiopathic) with 07/29/2018 No Yes inflammation of right lower extremity S81.811D Laceration without foreign body, right lower leg, 02/14/2019 No Yes subsequent encounter S81.812D Laceration without foreign body, left lower leg, 02/14/2019 No Yes subsequent encounter L03.115 Cellulitis of right lower limb 09/24/2018 No Yes Inactive Problems Resolved Problems Electronic Signature(s) Signed: 05/20/2019 7:57:15 AM By: Linton Ham MD Entered By: Linton Ham on 05/19/2019 14:34:30 -------------------------------------------------------------------------------- Progress Note Details Patient Name: Date of Service: [name] 05/19/2019 1:00 PM Medical Record [id number] Patient Account Number: [id number] Date of Birth/Sex: Treating RN: [birth year] (63 y.o. M) Primary Care Provider: [name] Other Clinician: Referring Provider: Treating Provider/Extender:Robson, [name], [name] in Treatment: 42 Subjective History of Present Illness (HPI) ADMISSION 07/29/2018 [name] is a 63 year old  man with either prediabetes or diabetes he is on glipizide. In late October to November 2019 he noted a scabbed area on the back of his right calf. He picked this off a few times but it would not heal. He saw his primary physician on 12/5. It was felt at that time that this may actually heal on its own with conservative management. The next visit was on 07/20/2018 noted the  area was a lot worse and arranged for his treatment here. He has been topical antibiotics like Neosporin although he stopped using this when the wound looked worse. He is just been covering this with a clean Band-Aid. He is given Bactrim a week ago and he is finishing this currently. It is made some improvement in the surrounding erythema per the patient. He does not have a history of nonhealing wounds. No prior history of wounds on his legs that he had difficulty healing. No prior skin issues. 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 Past medical history; A. fib status post ablation and recent cardioversion he is on Xarelto. He has a history of systolic heart failure, cervical radiculopathy, cardiomyopathy and type 2 diabetes as discussed ABI in the right leg was noncompressible on the right 2/20; the biopsy I did on the patient last week was negative for malignancy. Culture grew Pseudomonas and methicillin sensitive staph aureus. He is on cefdinir 300 twice a day. I will have to make this a 10 day course. He put him in compression. He states that the redness pain and erythema are a lot better. I suspect the patient has chronic venous insufficiency probably with a secondary cellulitis 2/27; arrives today with copious amounts of drainage coming out of the wound irritating the skin below the wound area. He only renewed the final 3 days of cefdinir today 3/5; came in for a nurse change 3 days ago. Again a lot of drainage noted. Zinc oxide was applied unfortunately the drainage appears to a pool then he has a string of superficial open areas extending down into the Achilles area and some just below the wound. The wound itself does not look too bad. He is completed the antibiotics although apparently there was separation from the original 7 with the last 3 days. Culture grew a few MSSA and a few Pseudomonas 3/12; not too much difference over the last week. He came in for a dressing  change on Monday by our nursing staff. Necrotic debris again over the wound surface. The entire area looks irritated but nontender and I do not think shows obvious evidence of infection. We have not heard anything about the reflux studies. Also notable than in this diabetic man we had noncompressible vessels and although I can feel his pulses easily in his feet I will order arterial studies as well. 3/19-Patient had experience more pain has been dressed with a silver alginate, the wounds all have necrotic debris, very friable with easy bleeding with any kind of surface debridement including with gauze and Anasept and with a #3 curette. His vascular appointments unfortunately were all canceled on account of the virus outbreak resulting in studies only being done for emergent cases. His pulses are easily palpable in the lower extremity. He has open areas below the primary wound where he states the drainage which included purulent material also with some blood pooled and caused breakdown. 3/26; the patient was changed to Santyl with [facility] backing last week. This was largely because the silver alginate was  sticking to the wound. He is still having quite a bit of pain. He tells [place] that the reflux studies and arterial studies we had attempted to arrange through vein and vascular are not being booked until mid May. Culture that was done last week showed both Serratia moderate and a few methicillin sensitive staph aureus I started him on Bactrim DS 1 p.o. twice daily on 3/23 for 7 days. He is here for follow-up. 4/3; patient is on Santyl with Hydrofera Blue. Patient complains of pain. Our intake nurse is noted drainage and some odor. He has completed Bactrim recently for Serratia and a few methicillin sensitive staph aureus. After our staff push hard last week we were able to get venous studies as well as arterial studies. His arterial studies showed on the right great toe pressure of 0.86. On  the left ABI at 1.47 TBI of 0.87. On both sides waveforms were triphasic VENOUS STUDIES showed reflux in the femoral vein, popliteal vein great saphenous vein at the saphenofemoral junction and great saphenous vein at the proximal thigh. Also noted to have age-indeterminate superficial vein thrombosis involving the small saphenous vein. Notable that the patient is already on Xarelto for atrial fibrillation. 4/9; the patient has been to see vascular surgery and reviewed by Dr. [name]. He was felt to have only minimal superficial venous reflux in the right great greater saphenous vein. For this reason he was not felt to be a candidate for laser ablation of the right greater saphenous vein. A wedge cushion was suggested to keep his leg elevated. He does have deep venous reflux on the right. Culture I did last time showed a combination of Serratia Pseudomonas and methicillin sensitive staph aureus. We have been using Hydrofera Blue to the large wound, Santyl to some of the deep punched out satellite lesions distal to it. There is some improvement [id number]; the application for Apligraf was put out for further review for material that we have resubmitted. He has the deep area on the right posterior calf which is large and then 3 small punched out areas beneath this. In general his wounds look a lot better 4/23; the patient has his large original wound and then 2 punched out satellite lesions below it on the right posterior calf. I was usually able to use Apligraf #1 to cover the full surface area of the larger wound. We continued with Santyl and Hydrofera Blue on the 2 punched-out areas 5/7; patient has his large original wound and 2 punched out satellite lesions below it in the right posterior calf. Apligraf #2 today. Major improvement in the big wound in terms of wound depth. Punched-out [place] wounds have a very healthy looking wound bed. 5/21-Patient comes back for his large right posterior calf wound  for which she has been an Apligraf #3 today. Wound depth seems to be better, the punched-out wounds appear to have healthy bed with some bleeding 6/4; right posterior calf wound is much better. Apligraf #4 today. The major wound has no wound depth at all. Only 1 of the satellite lesions is still open. The patient has very high blood pressure coming in today with a diastolic blood pressure of 130 after lying there for a few minutes it was down to 110. He states that is running [id number] diastolic at home. He was high last week as well I thought he was on 50 mg 1/2 tablet twice daily of losartan I told him to double up on that however it turns out  he is actually on 50 twice daily. Course he is run out of his medications prematurely. He has a follow-up with his primary's office next Wednesday. 6/18; patient's wounds continue to improve. The small area laterally is just about closed and there is continued epithelialization on the area on the posterior calf. 7/2; we applied his last Apligraf last visit. Early the next week there was a lot of purulent looking drainage that I cultured this grew staph and Pseudomonas however when we had him back for the next visit to 3 days later everything looked a lot better. He did not receive systemic antibiotics. Fortunately we did not have to remove the Apligraf. He has had heart trouble this week he was having an ablation apparently they have discovered a clot in his left atrium they have changed all his anticoagulants as well as his blood pressure medication. 7/16; using Hydrofera Blue. We are making nice progress towards closure. He has not yet ordered his compression stockings he has his measurements 7/23; dimensions are better. He has a new satellite area superiorly. Both wounds cauterized with silver nitrate. 7/30; absolutely no change in the major wound on the posterior part of the right calf. We have been using Hydrofera Blue for a prolonged period of time  and really had a nice improvement but over the last few weeks this is stalled. There is no depth. He arrived in clinic today with a new area on the right anterior tibia which apparently was an "ingrown hair". It is clearly an open area. This looks like a wrap injury although he was not really aware of it. 8/31; since the patient was last in clinic he was admitted to hospital from 8/17 through 8/22. He had had multiple falls at home including one off a ladder. He was admitted to hospital with mild COVID-19 viral pneumonia. Noted to be hyperkalemic and in acute renal failure. He has chronic systolic heart failure with ejection fraction of 35%. He arrives back in clinic with the original wound on the posterior aspect of the right lower calf looking a lot better. He has eschared areas from falls and lacerations on his anterior knees bilaterally just below the patella and on the mid part of his tibia bilaterally. He seems to have a small area on the right lateral ankle as well 9/10- Patient comes to clinic with a 2 new wounds one on the left anterior shin and one on the right posterior leg both the left knee and right knee areas are healed up. We have been using Santyl to the left anterior leg and silver alginate to right posterior leg wounds. 9/17; the patient is original wound looks good on the posterior right calf. He has traumatic areas on the left anterior shin left anterior patellar tendon and the right mid tibia area. Except for the right posterior calf all required debridement. We have been using Santyl on these areas and silver alginate posteriorly right calf 9/24; the original wound on the posterior calf on the right looks good. This is healthy. There are traumatic wounds in the left anterior shin, left anterior patella in the right mid tibia area are about the same. He has dusky erythema on the left anterior tibia which led me to give him antibiotics last week. This does not look too much  different. This is nontender and I wonder if this is all just venous inflammation. 10/2; the patient has 2 open areas on the right posterior calf both of these look good we  have been using silver alginate. He has traumatic wounds on the left and right mid tibia areas. The surface of these wounds is still not ready for closure. We have been using Iodoflex. Finally has areas on the left knee. The latter wounds are all traumatic after a fall 10/9. His original wounds of the right posterior calf are superficial and progressing towards closure. His traumatic wounds on the left anterior tibia and right anterior tibia are considerably improved in terms of the wound surface. He asked me about a skin graft in these areas I do not think this is going to be necessary. Change from Iodosorb to [facility]. ooThe area on the left knee is just about closed as well 10/16; his original wound on the right posterior calf very superficial looks healthy. He has more recent traumatic wounds on the left anterior tibia and right anterior tibia both of these have better looking surfaces and measuring smaller. Might be beneficial for an advanced treatment product. The area on his left knee has a small superficial scab and we are going to close that when 10/23; his original wound areas on the right posterior calf continue to improve. The more recently traumatic wounds on the left anterior tibia and right anterior tibia both look better in terms of surfaces. No debridement required. We have been using Hydrofera Blue. He is approved for Apligraf 11/3; Apligraf #1 applied to the left anterior tibia right anterior tibia and to 2 small areas remaining on the right posterior calf which were part of his initial wound area. 11/19; Apligraf #2. Left anterior tibia is healed. Right anterior tibia much better. Only one small area remains on the right posterior calf which was part of his initial wound area 12/3; the left  anterior tibia has opened up again. Hyper granulated nodule. Right anterior tibia is superficial and larger. There is no depth of this. I did not place Apligraf today return to [facility] under compression Objective Constitutional Patient is hypertensive.. Pulse regular and within target range for patient.[name] Respirations regular, non-labored and within target range.. Temperature is normal and within the target range for the patient.[name] Appears in no distress. Vitals Time Taken: 1:40 PM, Height: 73 in, Weight: 290 lbs, BMI: 38.3, Temperature: 98.0 F, Pulse: 53 bpm, Respiratory Rate: 18 breaths/min, Blood Pressure: 157/86 mmHg. Eyes Conjunctivae clear. No discharge.no icterus. Respiratory work of breathing is normal. Cardiovascular Pedal pulses palpable and strong bilaterally.. IMA control is good. Psychiatric appears at normal baseline. General Notes: Wound exam; right anterior tibia is healed. Right posterior calf is superficial but larger as. As opposed to 2 weeks ago he now has a raised hyper granulated area on the left anterior tibial area there is no surrounding infection Integumentary (Hair, Skin) Changes of chronic venous insufficiency. Wound #6 status is Open. Original cause of wound was Gradually Appeared. The wound is located on the Right,Proximal,Posterior Lower Leg. The wound measures 1cm length x 0.7cm width x 0.1cm depth; 0.55cm^2 area and 0.055cm^3 volume. There is Fat Layer (Subcutaneous Tissue) Exposed exposed. There is no tunneling or undermining noted. There is a medium amount of serosanguineous drainage noted. The wound margin is flat and intact. There is large (67-100%) pink granulation within the wound bed. There is no necrotic tissue within the wound bed. Wound #8 status is Open. Original cause of wound was Gradually Appeared. The wound is located on the Left,Anterior Lower Leg. The wound measures 4cm length x 0.4cm width x 0.1cm depth; 1.257cm^2 area  and 0.126cm^3  volume. There is Fat Layer (Subcutaneous Tissue) Exposed exposed. There is no tunneling or undermining noted. There is a small amount of serosanguineous drainage noted. The wound margin is flat and intact. There is large (67-100%) red granulation within the wound bed. There is no necrotic tissue within the wound bed. Assessment Active Problems ICD-10 Non-pressure chronic ulcer of right calf with fat layer exposed Type 2 diabetes mellitus with other skin ulcer Chronic venous hypertension (idiopathic) with inflammation of right lower extremity Laceration without foreign body, right lower leg, subsequent encounter Laceration without foreign body, left lower leg, subsequent encounter Cellulitis of right lower limb Procedures Wound #6 Pre-procedure diagnosis of Wound #6 is a Venous Leg Ulcer located on the Right,Proximal,Posterior Lower Leg . There was a Four Layer Compression Therapy Procedure with a pre-treatment ABI of 0.9 by Baruch Gouty, RN. Post procedure Diagnosis Wound #6: Same as Pre-Procedure Wound #8 Pre-procedure diagnosis of Wound #8 is a Venous Leg Ulcer located on the Left,Anterior Lower Leg . There was a Four Layer Compression Therapy Procedure with a pre-treatment ABI of 0.9 by Baruch Gouty, RN. Post procedure Diagnosis Wound #8: Same as Pre-Procedure Pre-procedure diagnosis of Wound #8 is a Venous Leg Ulcer located on the Left,Anterior Lower Leg . An Chemical Cauterization procedure was performed by Ricard Dillon., MD. Post procedure Diagnosis Wound #8: Same as Pre-Procedure Notes: silver nitrate Plan Follow-up Appointments: Return Appointment in 1 week. Dressing Change Frequency: Do not change entire dressing for one week. - both lower legs. Skin Barriers/Peri-Wound Care: Moisturizing lotion - both legs Wound Cleansing: May shower with protection. Primary Wound Dressing: Wound #6 Right,Proximal,Posterior Lower Leg: Hydrofera Blue Wound #8  Left,Anterior Lower Leg: Hydrofera Blue Secondary Dressing: Wound #6 Right,Proximal,Posterior Lower Leg: Foam - apply foam for protection to anterior portion of lower leg. Dry Gauze Wound #8 Left,Anterior Lower Leg: ABD pad Edema Control: 4 layer compression - Bilateral Patient to wear own compression stockings - patient to bring in on next wound care appointment. Avoid standing for long periods of time Elevate legs to the level of the heart or above for 30 minutes daily and/or when sitting, a frequency of: - 3-4 times a day. Exercise regularly 1. I am not really sure how to explain the appearance of his wounds today on either side. The area on the right anterior tibia is healed the right posterior leg is even larger however it is superficial and I do not see any need for an Apligraf today. The area on the left anterior mid tibia is a hyper granulated nodule which I have cauterized with silver nitrate. I went back to [facility] under compression. I did not feel that we required Apligraf Electronic Signature(s) Signed: 05/20/2019 7:57:15 AM By: Linton Ham MD Entered By: Linton Ham on 05/19/2019 14:42:30 -------------------------------------------------------------------------------- SuperBill Details Patient Name: Date of Service: [name] 05/19/2019 Medical Record [phone] Patient Account Number: [id number] Date of Birth/Sex: Treating RN: 04-Nov-1955 (63 y.o. [name], Tammi Klippel Primary Care Provider: [name] Other Clinician: Referring Provider: Treating Provider/Extender:Robson, [name], [name] in Treatment: 42 Diagnosis Coding ICD-10 Codes Code Description [phone] Non-pressure chronic ulcer of right calf with fat layer exposed E11.622 Type 2 diabetes mellitus with other skin ulcer I87.321 Chronic venous hypertension (idiopathic) with inflammation of right lower extremity S81.811D Laceration without foreign body, right  lower leg, subsequent encounter S81.812D Laceration without foreign body, left lower leg, subsequent encounter L03.115 Cellulitis of right lower limb Facility Procedures CPT4 Code Description: [id number] [place]  GRANULATION TISS ICD-10 Diagnosis Description S81.812D Laceration without foreign body, left lower leg, subsequent en E11.622 Type 2 diabetes mellitus with other skin ulcer Modifier: counter Quantity: 1 CPT4 Code Description: [id number] (Facility Use Only) [id number] - APPLY MULTLAY COMPRS LWR RT LEG Modifier: 59 Quantity: 1 Physician Procedures CPT4 Code Description: [id number] - WC PHYS CHEM CAUT GRAN TISSUE ICD-10 Diagnosis Description S81.812D Laceration without foreign body, left lower leg, subsequen E11.622 Type 2 diabetes mellitus with other skin ulcer Modifier: t encounter Quantity: 1 Electronic Signature(s) Signed: 05/20/2019 7:57:15 AM By: Linton Ham MD Entered By: Linton Ham on 05/19/2019 14:42:41

## 2019-05-21 ENCOUNTER — Other Ambulatory Visit: Payer: Self-pay | Admitting: Internal Medicine

## 2019-05-24 NOTE — Progress Notes (Signed)
NAVARRO, NINE ([id number]) Visit Report for 04/01/2019 Arrival Information Details Patient Name: Date of Service: [name], [name] 04/01/2019 1:00 PM Medical Record [id number] Patient Account Number: [id number] Date of Birth/Sex: Treating RN: 04/04/1956 (63 y.o. [name]) Carlene Coria Primary Care Maite Burlison: [name] Other Clinician: Referring Mardene Lessig: Treating Dysen Edmondson/Extender:Robson, [name], [name] in Treatment: 22 Visit Information History Since Last Visit All ordered tests and consults were completed: No Patient Arrived: Ambulatory Added or deleted any medications: No Arrival Time: 13:08 Any new allergies or adverse reactions: No Accompanied By: self Had a fall or experienced change in No Transfer Assistance: None activities of daily living that may affect Patient Identification Verified: Yes risk of falls: Secondary Verification Process Yes Signs or symptoms of abuse/neglect since last No Completed: visito Patient Requires Transmission-Based No Hospitalized since last visit: No Precautions: Implantable device outside of the clinic excluding No Patient Has Alerts: Yes cellular tissue based products placed in the center Patient Alerts: Patient on Blood since last visit: Thinner Has Dressing in Place as Prescribed: Yes xarelto Has Compression in Place as Prescribed: Yes R TBI = .86, L TBI = .87 Pain Present Now: Yes Electronic Signature(s) Signed: 05/24/2019 3:01:15 PM By: Carlene Coria RN Entered By: Carlene Coria on 04/01/2019 13:09:19 -------------------------------------------------------------------------------- Compression Therapy Details Patient Name: Date of Service: [name], [name] 04/01/2019 1:00 PM Medical Record [id number] Patient Account Number: [id number] Date of Birth/Sex: Treating RN: 1955/11/04 (63 y.o. [name] Primary Care Cale Bethard: [name] Other Clinician: Referring Montoya Brandel:  Treating Gerrell Tabet/Extender:Robson, [name], [name] in Treatment: 35 Compression Therapy Performed for Wound Wound #6 Right,Proximal,Posterior Lower Leg Assessment: Performed By: Clinician Levan Hurst, RN Compression Type: Four Layer Post Procedure Diagnosis Same as Pre-procedure Electronic Signature(s) Signed: 04/01/2019 6:00:55 PM By: Levan Hurst RN, BSN Entered By: Levan Hurst on 04/01/2019 13:31:39 -------------------------------------------------------------------------------- Compression Therapy Details Patient Name: Date of Service: [name] 04/01/2019 1:00 PM Medical Record [id number] Patient Account Number: [id number] Date of Birth/Sex: Treating RN: 1955/07/09 (63 y.o. [name] Primary Care Lizzet Hendley: [name] Other Clinician: Referring Jaton Eilers: Treating Keshun Berrett/Extender:Robson, [name], [name] in Treatment: 35 Compression Therapy Performed for Wound Wound #7 Right,Anterior Lower Leg Assessment: Performed By: Clinician Levan Hurst, RN Compression Type: Four Layer Post Procedure Diagnosis Same as Pre-procedure Electronic Signature(s) Signed: 04/01/2019 6:00:55 PM By: Levan Hurst RN, BSN Entered By: Levan Hurst on 04/01/2019 13:31:39 -------------------------------------------------------------------------------- Compression Therapy Details Patient Name: Date of Service: [name] 04/01/2019 1:00 PM Medical Record [id number] Patient Account Number: [id number] Date of Birth/Sex: Treating RN: Sep 13, 1955 (63 y.o. [name] Primary Care Markus Casten: [name] Other Clinician: Referring Vidhi Delellis: Treating Ailyne Pawley/Extender:Robson, [name], [name] in Treatment: 35 Compression Therapy Performed for Wound Wound #8 Left,Anterior Lower Leg Assessment: Performed By: Clinician Levan Hurst, RN Compression Type: Four Layer Post Procedure  Diagnosis Same as Pre-procedure Electronic Signature(s) Signed: 04/01/2019 6:00:55 PM By: Levan Hurst RN, BSN Entered By: Levan Hurst on 04/01/2019 13:31:39 -------------------------------------------------------------------------------- Encounter Discharge Information Details Patient Name: Date of Service: [name]. 04/01/2019 1:00 PM Medical Record [id number] Patient Account Number: [id number] Date of Birth/Sex: Treating RN: 1955/06/20 (63 y.o. [name] Primary Care Jerene [name]: [name] Other Clinician: Referring Leshonda Galambos: Treating Maryela Tapper/Extender:Robson, [name], [name] in Treatment: 35 Encounter Discharge Information Items Discharge Condition: Stable Ambulatory Status: Ambulatory Discharge Destination: Home Transportation: Private Auto Accompanied By: self Schedule Follow-up Appointment: Yes Clinical Summary of Care: Patient Declined Electronic Signature(s) Signed: 04/05/2019 6:16:57 PM By: Kela Millin Entered By: Kela Millin  on 04/01/2019 14:11:51 -------------------------------------------------------------------------------- Lower Extremity Assessment Details Patient Name: Date of Service: [name] 04/01/2019 1:00 PM Medical Record [id number] Patient Account Number: [id number] Date of Birth/Sex: Treating RN: July 28, 1955 (63 y.o. [name]) Carlene Coria Primary Care Emine Lopata: [name] Other Clinician: Referring Jadrien Narine: Treating Aanshi Batchelder/Extender:Robson, [name], [name] in Treatment: 35 Edema Assessment Assessed: [Left: No] [Right: No] Edema: [Left: Yes] [Right: No] Calf Left: Right: Point of Measurement: cm From Medial Instep 41 cm 38 cm Ankle Left: Right: Point of Measurement: cm From Medial Instep 23 cm 22 cm Electronic Signature(s) Signed: 05/24/2019 3:01:15 PM By: Carlene Coria RN Entered By: Carlene Coria on 04/01/2019  13:12:01 -------------------------------------------------------------------------------- Multi Wound Chart Details Patient Name: Date of Service: [name] 04/01/2019 1:00 PM Medical Record [id number] Patient Account Number: [id number] Date of Birth/Sex: Treating RN: 04-07-1956 (63 y.o. [name] Primary Care Madisynn Plair: [name] Other Clinician: Referring Winifred Bodiford: Treating Jahfari Ambers/Extender:Robson, [name], [name] in Treatment: 35 Vital Signs Height(in): 3 Pulse(bpm): 67 Weight(lbs): 290 Blood Pressure(mmHg): 154/98 Body Mass Index(BMI): 38 Temperature(F): 97.8 Respiratory 18 Rate(breaths/min): Photos: [1:No Photos] [11:No Photos] [6:No Photos] Wound Location: [1:Right Calf - Posterior] [11:Left Knee] [6:Right Lower Leg - Posterior, Proximal] Wounding Event: [1:Gradually Appeared] [11:Blister] [6:Gradually Appeared] Primary Etiology: [1:Venous Leg Ulcer] [11:Diabetic Wound/Ulcer of the Venous Leg Ulcer Lower Extremity] Secondary Etiology: [1:Cellulitis] [11:N/A] [6:N/A] Comorbid History: [1:Asthma, Sleep Apnea, Arrhythmia, Congestive Heart Failure, Hypertension, Heart Failure, Hypertension, Heart Failure, Hypertension, Type II Diabetes] [11:Asthma, Sleep Apnea, Arrhythmia, Congestive Type II Diabetes] [6:Asthma, Sleep  Apnea, Arrhythmia, Congestive Type II Diabetes] Date Acquired: [1:04/16/2018] [11:02/25/2019] [6:01/06/2019] Weeks of Treatment: [1:35] [11:4] [6:12] Wound Status: [1:Open] [11:Healed - Epithelialized] [6:Open] Measurements L x W x D 0x0x0 [11:0x0x0] [6:0.8x1x0.1] (cm) Area (cm) : [1:0] [11:0] [6:0.628] Volume (cm) : [1:0] [11:0] [6:0.063] % Reduction in Area: [1:100.00%] [11:100.00%] [6:-59.80%] % Reduction in Volume: 100.00% [11:100.00%] [6:-61.50%] Classification: [1:Full Thickness With Exposed Support Structures] [11:Grade 1] [6:Full Thickness Without Exposed Support Structures] Exudate Amount: [1:None  Present] [11:None Present] [6:Medium] Exudate Type: [1:N/A] [11:N/A] [6:Serosanguineous] Exudate Color: [1:N/A] [11:N/A] [6:red, brown] Wound Margin: [1:Flat and Intact] [11:Distinct, outline attached] [6:Flat and Intact] Granulation Amount: [1:None Present (0%)] [11:None Present (0%)] [6:Large (67-100%)] Granulation Quality: [1:N/A] [11:N/A] [6:Red, Pink] Necrotic Amount: [1:None Present (0%)] [11:None Present (0%)] [6:None Present (0%)] Exposed Structures: [1:Fat Layer (Subcutaneous Tissue) Exposed: Yes Fascia: No Tendon: No Muscle: No Joint: No Bone: No] [11:Fascia: No Fat Layer (Subcutaneous Tissue) Exposed: No Tendon: No Muscle: No Joint: No Bone: No] [6:Fat Layer (Subcutaneous Tissue) Exposed: Yes  Fascia: No Tendon: No Muscle: No Joint: No Bone: No] Epithelialization: [1:Small (1-33%)] [11:Large (67-100%)] [6:Large (67-100%)] Procedures Performed: [1:N/A 7] [11:N/A 8] [6:Compression Therapy N/A] Photos: [1:No Photos] [11:No Photos] [6:N/A] Wound Location: [1:Right Lower Leg - Anterior Left Lower Leg - Anterior] [6:N/A] Wounding Event: [1:Trauma] [11:Gradually Appeared] [6:N/A] Primary Etiology: [1:Venous Leg Ulcer] [11:Venous Leg Ulcer] [6:N/A] Secondary Etiology: [1:N/A] [11:N/A] [6:N/A] Comorbid History: [1:Asthma, Sleep Apnea, Arrhythmia, Congestive Heart Failure, Hypertension, Heart Failure, Hypertension, Type II Diabetes] [11:Asthma, Sleep Apnea, Arrhythmia, Congestive Type II Diabetes] [6:N/A] Date Acquired: [1:01/13/2019] [11:02/14/2019] [6:N/A] Weeks of Treatment: [1:11] [11:6] [6:N/A] Wound Status: [1:Open] [11:Open] [6:N/A] Measurements L x W x D 2x1.3x0.1 [11:9.2x2.6x0.1] [6:N/A] (cm) Area (cm) : [1:2.042] [11:18.787] [6:N/A] Volume (cm) : [1:0.204] [11:1.879] [6:N/A] % Reduction in Area: [1:-941.80%] [11:-199.00%] [6:N/A] % Reduction in Volume: -920.00% [11:-199.20%] [6:N/A] Classification: [1:Full Thickness Without Exposed Support Structures Exposed Support Structures]  [11:Full Thickness Without] [6:N/A] Exudate Amount: [1:Medium] [11:Medium] [6:N/A] Exudate Type: [1:Serosanguineous] [  11:Serosanguineous] [6:N/A] Exudate Color: [1:red, brown] [11:red, brown] [6:N/A] Wound Margin: [1:Well defined, not attached Distinct, outline attached N/A] Granulation Amount: [1:Large (67-100%)] [11:Medium (34-66%)] [6:N/A] Granulation Quality: [1:Red, Pink] [11:Red] [6:N/A] Necrotic Amount: [1:Small (1-33%)] [11:Medium (34-66%)] [6:N/A] Exposed Structures: [1:Fat Layer (Subcutaneous Fat Layer (Subcutaneous N/A Tissue) Exposed: Yes Fascia: No Tendon: No Muscle: No Joint: No Bone: No] [11:Tissue) Exposed: Yes Fascia: No Tendon: No Muscle: No Joint: No Bone: No] Epithelialization: [1:Small (1-33%)] [11:Small (1-33%) Compression Therapy] [6:N/A N/A] Treatment Notes Electronic Signature(s) Signed: 04/01/2019 5:33:39 PM By: Linton Ham MD Signed: 04/01/2019 6:00:55 PM By: Levan Hurst RN, BSN Entered By: Linton Ham on 04/01/2019 13:54:26 -------------------------------------------------------------------------------- Multi-Disciplinary Care Plan Details Patient Name: Date of Service: [name]. 04/01/2019 1:00 PM Medical Record [id number] Patient Account Number: [id number] Date of Birth/Sex: Treating RN: 1956-06-14 (63 y.o. [name] Primary Care Albena Comes: [name] Other Clinician: Referring Jackolyn Geron: Treating Tru Rana/Extender:Robson, [name], [name] in Treatment: 35 Active Inactive Venous Leg Ulcer Nursing Diagnoses: Actual venous Insuffiency (use after diagnosis is confirmed) Knowledge deficit related to disease process and management Goals: Non-invasive venous studies are completed as ordered Date Initiated: 09/17/2018 Date Inactivated: 09/23/2018 Target Resolution Date: 09/23/2018 Goal Status: Met Patient will maintain optimal edema control Date Initiated: 09/17/2018 Target Resolution Date:  04/15/2019 Goal Status: Active Patient/caregiver will verbalize understanding of disease process and disease management Date Initiated: 09/17/2018 Date Inactivated: 09/23/2018 Target Resolution Date: 10/15/2018 Goal Status: Met Interventions: Assess peripheral edema status every visit. Compression as ordered Provide education on venous insufficiency Treatment Activities: Therapeutic compression applied : 09/17/2018 Notes: Electronic Signature(s) Signed: 04/01/2019 6:00:55 PM By: Levan Hurst RN, BSN Entered By: Levan Hurst on 04/01/2019 13:31:14 -------------------------------------------------------------------------------- Pain Assessment Details Patient Name: Date of Service: [name] 04/01/2019 1:00 PM Medical Record [id number] Patient Account Number: [id number] Date of Birth/Sex: Treating RN: 07-17-1955 (63 y.o. [name]) Carlene Coria Primary Care Malakie Balis: [name] Other Clinician: Referring Kaleo Condrey: Treating Bach Rocchi/Extender:Robson, [name], [name] in Treatment: 35 Active Problems Location of Pain Severity and Description of Pain Patient Has Paino Yes Site Locations Pain Location: Pain in Ulcers With Dressing Change: Yes Duration of the Pain. Constant / Intermittento Intermittent How Long Does it Lasto Hours: Minutes: 15 Rate the pain. Current Pain Level: 4 Worst Pain Level: 6 Least Pain Level: 0 Tolerable Pain Level: 5 Character of Pain Describe the Pain: Aching, Burning Pain Management and Medication Current Pain Management: Medication: No Cold Application: No Rest: No Massage: No Activity: No T.E.N.S.: No Heat Application: No Leg drop or elevation: No Is the Current Pain Management Adequate: Inadequate How does your wound impact your activities of daily livingo Sleep: No Bathing: No Appetite: No Relationship With Others: No Bladder Continence: No Emotions: No Bowel Continence: No Work: No Toileting:  No Drive: No Dressing: No Hobbies: No Electronic Signature(s) Signed: 05/24/2019 3:01:15 PM By: Carlene Coria RN Entered By: Carlene Coria on 04/01/2019 13:10:37 -------------------------------------------------------------------------------- Patient/Caregiver Education Details Patient Name: Date of Service: [name] 10/16/2020andnbsp1:00 PM Medical Record [id number] Patient Account Number: [id number] Date of Birth/Gender: Treating RN: 04-12-1956 (63 y.o. [name] Primary Care Physician: [name] Other Clinician: Referring Physician: Treating Physician/Extender:Robson, [name], [name] in Treatment: 51 Education Assessment Education Provided To: Patient Education Topics Provided Wound/Skin Impairment: Methods: Explain/Verbal Responses: State content correctly [employer]) Signed: 04/01/2019 6:00:55 PM By: Levan Hurst RN, BSN Entered By: Levan Hurst on 04/01/2019 13:31:25 -------------------------------------------------------------------------------- Wound Assessment Details Patient Name: Date of Service: [name] 04/01/2019 1:00 PM Medical Record  [id number] Patient Account Number: [id number] Date of Birth/Sex: Treating RN: 19-May-1956 (63 y.o. [name]) Carlene Coria Primary Care Kelsey Edman: [name] Other Clinician: Referring Kolden Dupee: Treating Malayia Spizzirri/Extender:Robson, [name], [name] in Treatment: 35 Wound Status Wound Number: 1 Primary Venous Leg Ulcer Etiology: Wound Location: Right Calf - Posterior Secondary Cellulitis Wounding Event: Gradually Appeared Etiology: Date Acquired: 04/16/2018 Wound Healed - Epithelialized Weeks Of Treatment: 35 Status: Clustered Wound: No Comorbid Asthma, Sleep Apnea, Arrhythmia, History: Congestive Heart Failure, Hypertension, Type II Diabetes Photos Wound Measurements Length: (cm) 0 % Reductio Width: (cm) 0 %  Reductio Depth: (cm) 0 Epithelial Area: (cm) 0 Tunneling Volume: (cm) 0 Undermini Wound Description Classification: Full Thickness With Exposed Support Structures Wound Flat and Intact Margin: Exudate None Present Amount: Wound Bed Granulation Amount: None Present (0%) Necrotic Amount: None Present (0%) Foul Odor After Cleansing: No Slough/Fibrino No Exposed Structure Fascia Exposed: No Fat Layer (Subcutaneous Tissue) Exposed: Yes Tendon Exposed: No Muscle Exposed: No Joint Exposed: No Bone Exposed: No n in Area: 100% n in Volume: 100% ization: Small (1-33%) : No ng: No Electronic Signature(s) Signed: 04/05/2019 4:20:24 PM By: Mikeal Hawthorne EMT/HBOT Signed: 05/24/2019 3:01:15 PM By: Carlene Coria RN Entered By: Mikeal Hawthorne on 04/05/2019 13:33:36 -------------------------------------------------------------------------------- Wound Assessment Details Patient Name: Date of Service: [name] 04/01/2019 1:00 PM Medical Record [id number] Patient Account Number: [id number] Date of Birth/Sex: Treating RN: February 05, 1956 (63 y.o. [name]) Carlene Coria Primary Care Kamal Jurgens: [name] Other Clinician: Referring Revonda Menter: Treating Anuoluwapo Mefferd/Extender:Robson, [name], [name] in Treatment: 35 Wound Status Wound Number: 11 Primary Diabetic Wound/Ulcer of the Lower Extremity Etiology: Wound Location: Left Knee Wound Healed - Epithelialized Wounding Event: Blister Wounding Event: Blister Status: Date Acquired: 02/25/2019 Comorbid Asthma, Sleep Apnea, Arrhythmia, Congestive Weeks Of Treatment: 4 History: Heart Failure, Hypertension, Type II Diabetes Clustered Wound: No Photos Wound Measurements Length: (cm) 0 % Reduction Width: (cm) 0 % Reduction Depth: (cm) 0 Epitheliali Area: (cm) 0 Tunneling: Volume: (cm) 0 Underminin Wound Description Classification: Grade 1 Foul Odor Wound Margin: Distinct, outline attached  Slough/Fib Exudate Amount: None Present Wound Bed Granulation Amount: None Present (0%) Necrotic Amount: None Present (0%) Fascia Exp Fat Layer Tendon Exp Muscle Exp Joint Expo Bone Expos After Cleansing: No rino No Exposed Structure osed: No (Subcutaneous Tissue) Exposed: No osed: No osed: No sed: No ed: No in Area: 100% in Volume: 100% zation: Large (67-100%) No g: No Electronic Signature(s) Signed: 04/05/2019 4:20:24 PM By: Mikeal Hawthorne EMT/HBOT Signed: 05/24/2019 3:01:15 PM By: Carlene Coria RN Previous Signature: 04/01/2019 6:00:55 PM Version By: Levan Hurst RN, BSN Entered By: Mikeal Hawthorne on 04/05/2019 13:34:27 -------------------------------------------------------------------------------- Wound Assessment Details Patient Name: Date of Service: [name] 04/01/2019 1:00 PM Medical Record [id number] Patient Account Number: [id number] Date of Birth/Sex: Treating RN: [birth year] (63 y.o. [name]) Carlene Coria Primary Care Candy Ziegler: [name] Other Clinician: Referring Shardai Star: Treating Riordan Walle/Extender:Robson, [name], [name] in Treatment: 35 Wound Status Wound Number: 6 Primary Venous Leg Ulcer Etiology: Wound Location: Right Lower Leg - Posterior, Proximal Wound Open Status: Wounding Event: Gradually Appeared Comorbid Asthma, Sleep Apnea, Arrhythmia, Congestive Date Acquired: 01/06/2019 History: Heart Failure, Hypertension, Type II Diabetes Weeks Of Treatment: 12 Clustered Wound: No Photos Wound Measurements Length: (cm) 0.8 % Reduct Width: (cm) 1 % Reduct Depth: (cm) 0.1 Epitheli Area: (cm) 0.628 Tunneli Volume: (cm) 0.063 Undermi Wound Description Classification: Full Thickness Without Exposed Support Foul Odo Structures Slough/F Wound Flat and Intact Margin: Exudate Medium Amount: Exudate Serosanguineous Type: Exudate red, brown Color:  Wound Bed Granulation Amount: Large  (67-100%) Granulation Quality: Red, Pink Fascia E Necrotic Amount: None Present (0%) Fat Laye Tendon E Muscle E Joint Ex Bone Exp r After Cleansing: No ibrino No Exposed Structure xposed: No r (Subcutaneous Tissue) Exposed: Yes xposed: No xposed: No posed: No osed: No ion in Area: -59.8% ion in Volume: -61.5% alization: Large (67-100%) ng: No ning: No Electronic Signature(s) Signed: 04/05/2019 4:20:24 PM By: Mikeal Hawthorne EMT/HBOT Signed: 05/24/2019 3:01:15 PM By: Carlene Coria RN Entered By: Mikeal Hawthorne on 04/05/2019 13:34:00 -------------------------------------------------------------------------------- Wound Assessment Details Patient Name: Date of Service: [name] 04/01/2019 1:00 PM Medical Record [id number] Patient Account Number: [id number] Date of Birth/Sex: Treating RN: 02-08-1956 (63 y.o. [name]) Carlene Coria Primary Care Merrisa Skorupski: [name] Other Clinician: Referring Anina Schnake: Treating Anguel Delapena/Extender:Robson, [name], [name] in Treatment: 35 Wound Status Wound Number: 7 Primary Venous Leg Ulcer Etiology: Wound Location: Right Lower Leg - Anterior Wound Open Wounding Event: Trauma Status: Date Acquired: 01/13/2019 Comorbid Asthma, Sleep Apnea, Arrhythmia, Congestive Weeks Of Treatment: 11 History: Heart Failure, Hypertension, Type II Diabetes Clustered Wound: No Photos Wound Measurements Length: (cm) 2 % Reduct Width: (cm) 1.3 % Reduct Depth: (cm) 0.1 Epitheli Area: (cm) 2.042 Tunneli Volume: (cm) 0.204 Undermi Wound Description Classification: Full Thickness Without Exposed Support Foul Od Structures Slough/ Wound Well defined, not attached Margin: Exudate Medium Amount: Exudate Serosanguineous Type: Exudate red, brown Color: Wound Bed Granulation Amount: Large (67-100%) Granulation Quality: Red, Pink Fascia Necrotic Amount: Small (1-33%) Fat Lay Necrotic Quality: Adherent Slough  Tendon Muscle Joint E Bone Ex or After Cleansing: No Fibrino Yes Exposed Structure Exposed: No er (Subcutaneous Tissue) Exposed: Yes Exposed: No Exposed: No xposed: No posed: No ion in Area: -941.8% ion in Volume: -920% alization: Small (1-33%) ng: No ning: No Electronic Signature(s) Signed: 04/05/2019 4:20:24 PM By: Mikeal Hawthorne EMT/HBOT Signed: 05/24/2019 3:01:15 PM By: Carlene Coria RN Entered By: Mikeal Hawthorne on 04/05/2019 13:33:00 -------------------------------------------------------------------------------- Wound Assessment Details Patient Name: Date of Service: [name] 04/01/2019 1:00 PM Medical Record [id number] Patient Account Number: [id number] Date of Birth/Sex: Treating RN: 15-Apr-1956 (63 y.o. [name]) Carlene Coria Primary Care Laelia Angelo: [name] Other Clinician: Referring Kharon Hixon: Treating Naysa Puskas/Extender:Robson, [name], [name] in Treatment: 35 Wound Status Wound Number: 8 Primary Venous Leg Ulcer Etiology: Wound Location: Left Lower Leg - Anterior Wound Open Wounding Event: Gradually Appeared Status: Date Acquired: 02/14/2019 Comorbid Asthma, Sleep Apnea, Arrhythmia, Congestive Weeks Of Treatment: 6 History: Heart Failure, Hypertension, Type II Diabetes Clustered Wound: No Photos Wound Measurements Length: (cm) 9.2 % Reduct Width: (cm) 2.6 % Reduct Depth: (cm) 0.1 Epitheli Area: (cm) 18.787 Tunneli Volume: (cm) 1.879 Undermi Wound Description Full Thickness Without Exposed Support Foul Od Classification: Structures Slough/ Wound Distinct, outline attached Margin: Exudate Medium Amount: Exudate Serosanguineous Type: Exudate red, brown Color: Wound Bed Granulation Amount: Medium (34-66%) Granulation Quality: Red Fascia Necrotic Amount: Medium (34-66%) Fat Layer ( Necrotic Quality: Adherent Slough Tendon Expo Muscle Expo Joint Expos Bone Expose or After Cleansing: No Fibrino  Yes Exposed Structure Exposed: No Subcutaneous Tissue) Exposed: Yes sed: No sed: No ed: No d: No ion in Area: -199% ion in Volume: -199.2% alization: Small (1-33%) ng: No ning: No Electronic Signature(s) Signed: 04/05/2019 4:20:24 PM By: Mikeal Hawthorne EMT/HBOT Signed: 05/24/2019 3:01:15 PM By: Carlene Coria RN Entered By: Mikeal Hawthorne on 04/05/2019 13:34:49 -------------------------------------------------------------------------------- Vitals Details Patient Name: Date of Service: [name]. 04/01/2019 1:00 PM Medical Record [id number] Patient Account Number: [id number] Date of Birth/Sex: Treating RN:  11-Jan-1956 (63 y.o. [name]) Carlene Coria Primary Care Evens Meno: [name] Other Clinician: Referring Chi Woodham: Treating Deicy Rusk/Extender:Robson, [name], [name] in Treatment: 35 Vital Signs Time Taken: 13:09 Temperature (F): 97.8 Height (in): 73 Pulse (bpm): 66 Weight (lbs): 290 Respiratory Rate (breaths/min): 18 Body Mass Index (BMI): 38.3 Blood Pressure (mmHg): 154/98 Reference Range: 80 - 120 mg / dl Electronic Signature(s) Signed: 05/24/2019 3:01:15 PM By: Carlene Coria RN Entered By: Carlene Coria on 04/01/2019 13:09:53

## 2019-05-24 NOTE — Progress Notes (Signed)
FARHAD, BURLESON ([id number]) Visit Report for 05/19/2019 Arrival Information Details Patient Name: Date of Service: [name], [name] 05/19/2019 1:00 PM Medical Record [id number] Patient Account Number: [id number] Date of Birth/Sex: Treating RN: 1956/04/24 (63 y.o. [name] Primary Care Jazz Rogala: [name] Other Clinician: Referring Kashis Penley: Treating Becky Colan/Extender:Robson, [name], [name] in Treatment: 35 Visit Information History Since Last Visit Added or deleted any medications: No Patient Arrived: Ambulatory Any new allergies or adverse reactions: No Arrival Time: 13:37 Had a fall or experienced change in No Accompanied By: alone activities of daily living that may affect Transfer Assistance: None risk of falls: Patient Identification Verified: Yes Signs or symptoms of abuse/neglect since last No Secondary Verification Process Yes visito Completed: Hospitalized since last visit: No Patient Requires Transmission-Based No Implantable device outside of the clinic excluding No Precautions: cellular tissue based products placed in the center Patient Has Alerts: Yes since last visit: Patient Alerts: Patient on Blood Has Dressing in Place as Prescribed: Yes Thinner Has Compression in Place as Prescribed: Yes xarelto Pain Present Now: No R TBI = .86, L TBI = .87 Electronic Signature(s) Signed: 05/23/2019 5:51:44 PM By: Levan Hurst RN, BSN Entered By: Levan Hurst on 05/19/2019 13:37:53 -------------------------------------------------------------------------------- Compression Therapy Details Patient Name: Date of Service: [name] 05/19/2019 1:00 PM Medical Record [id number] Patient Account Number: [id number] Date of Birth/Sex: Treating RN: 1955-12-07 (63 y.o. [name] Primary Care Madilynn Montante: [name] Other Clinician: Referring Tony Granquist: Treating Rumeal Cullipher/Extender:Robson,  [name], [name] in Treatment: 42 Compression Therapy Performed for Wound Wound #6 Right,Proximal,Posterior Lower Leg Assessment: Performed By: Clinician Baruch Gouty, RN Compression Type: Four Layer Pre Treatment ABI: 0.9 Post Procedure Diagnosis Same as Pre-procedure Electronic Signature(s) Signed: 05/19/2019 6:37:14 PM By: Deon Pilling Entered By: Deon Pilling on 05/19/2019 14:11:49 -------------------------------------------------------------------------------- Compression Therapy Details Patient Name: Date of Service: OLANDER, FRIEDL 05/19/2019 1:00 PM Medical Record [id number] Patient Account Number: [id number] Date of Birth/Sex: Treating RN: July 13, 1955 (63 y.o. [name] Primary Care Deforest Maiden: [name] Other Clinician: Referring Manpreet Kemmer: Treating Nissi Doffing/Extender:Robson, [name], [name] in Treatment: 42 Compression Therapy Performed for Wound Wound #8 Left,Anterior Lower Leg Assessment: Performed By: Clinician Baruch Gouty, RN Compression Type: Four Layer Pre Treatment ABI: 0.9 Post Procedure Diagnosis Same as Pre-procedure Electronic Signature(s) Signed: 05/19/2019 6:37:14 PM By: Deon Pilling Entered By: Deon Pilling on 05/19/2019 14:12:15 -------------------------------------------------------------------------------- Encounter Discharge Information Details Patient Name: Date of Service: [name] 05/19/2019 1:00 PM Medical Record [id number] Patient Account Number: [id number] Date of Birth/Sex: Treating RN: 09-24-1955 (63 y.o. [name] Primary Care Ayane Delancey: [name] Other Clinician: Referring Burgandy Hackworth: Treating Valli Randol/Extender:Robson, [name], [name] in Treatment: 42 Encounter Discharge Information Items Discharge Condition: Stable Ambulatory Status: Ambulatory Discharge Destination: Home Transportation: Private Auto Accompanied By:  self Schedule Follow-up Appointment: Yes Clinical Summary of Care: Patient Declined Electronic Signature(s) Signed: 05/19/2019 5:56:19 PM By: Baruch Gouty RN, BSN Entered By: Baruch Gouty on 05/19/2019 14:40:39 -------------------------------------------------------------------------------- Lower Extremity Assessment Details Patient Name: Date of Service: GILFORD, LARDIZABAL 05/19/2019 1:00 PM Medical Record [id number] Patient Account Number: [id number] Date of Birth/Sex: Treating RN: Mar 30, 1956 (63 y.o. [name] Primary Care Rupa Lagan: [name] Other Clinician: Referring Cody Oliger: Treating Landen Knoedler/Extender:Robson, [name], [name] in Treatment: 42 Edema Assessment Assessed: [Left: No] [Right: No] Edema: [Left: No] [Right: No] Calf Left: Right: Point of Measurement: cm From Medial Instep 36.8 cm 36 cm Ankle Left: Right: Point of Measurement: cm From Medial Instep 22.5 cm 22 cm Vascular Assessment  Pulses: Dorsalis Pedis Palpable: [Left:Yes] [Right:Yes] Electronic Signature(s) Signed: 05/23/2019 5:51:44 PM By: Levan Hurst RN, BSN Entered By: Levan Hurst on 05/19/2019 13:49:07 -------------------------------------------------------------------------------- Multi Wound Chart Details Patient Name: Date of Service: [name] 05/19/2019 1:00 PM Medical Record [id number] Patient Account Number: [id number] Date of Birth/Sex: Treating RN: [birth year] (63 y.o. M) Primary Care Jaysiah Marchetta: [name] Other Clinician: Referring Arlind Klingerman: Treating Curry Seefeldt/Extender:Robson, [name], [name] in Treatment: 42 Vital Signs Height(in): 73 Pulse(bpm): 91 Weight(lbs): 290 Blood Pressure(mmHg): 157/86 Body Mass Index(BMI): 38 Temperature(F): 98.0 Respiratory 18 Rate(breaths/min): Photos: [6:No Photos] [8:No Photos] [N/A:N/A] Wound Location: [6:Right Lower Leg - Posterior, Left Lower Leg - Anterior  Proximal] [N/A:N/A] Wounding Event: [6:Gradually Appeared] [8:Gradually Appeared] [N/A:N/A] Primary Etiology: [6:Venous Leg Ulcer] [8:Venous Leg Ulcer] [N/A:N/A] Comorbid History: [6:Asthma, Sleep Apnea, Arrhythmia, Congestive Heart Failure, Hypertension, Heart Failure, Hypertension, Type II Diabetes] [8:Asthma, Sleep Apnea, Arrhythmia, Congestive Type II Diabetes] [N/A:N/A] Date Acquired: [6:01/06/2019] [8:02/14/2019] [N/A:N/A] Weeks of Treatment: [6:19] [8:13] [N/A:N/A] Wound Status: [6:Open] [8:Open] [N/A:N/A] Measurements L x W x D 1x0.7x0.1 [8:4x0.4x0.1] [N/A:N/A] (cm) Area (cm) : [6:0.55] [8:1.257] [N/A:N/A] Volume (cm) : [6:0.055] [8:0.126] [N/A:N/A] % Reduction in Area: [6:-39.90%] [8:80.00%] [N/A:N/A] % Reduction in Volume: -41.00% [8:79.90%] [N/A:N/A] Classification: [6:Full Thickness Without Exposed Support Structures Exposed Support Structures] [8:Full Thickness Without] [N/A:N/A] Exudate Amount: [6:Medium] [8:Small] [N/A:N/A] Exudate Type: [6:Serosanguineous] [8:Serosanguineous] [N/A:N/A] Exudate Color: [6:red, brown] [8:red, brown] [N/A:N/A] Wound Margin: [6:Flat and Intact] [8:Flat and Intact] [N/A:N/A] Granulation Amount: [6:Large (67-100%)] [8:Large (67-100%)] [N/A:N/A] Granulation Quality: [6:Pink] [8:Red] [N/A:N/A] Necrotic Amount: [6:None Present (0%)] [8:None Present (0%)] [N/A:N/A] Exposed Structures: [6:Fat Layer (Subcutaneous Fat Layer (Subcutaneous N/A Tissue) Exposed: Yes Fascia: No Tendon: No Muscle: No Joint: No Bone: No] [8:Tissue) Exposed: Yes Fascia: No Tendon: No Muscle: No Joint: No Bone: No] Epithelialization: [6:Large (67-100%)] [8:Large (67-100%)] [N/A:N/A] Procedures Performed: Compression Therapy [8:Chemical Cauterization Compression Therapy] [N/A:N/A] Treatment Notes Electronic Signature(s) Signed: 05/20/2019 7:57:15 AM By: Linton Ham MD Entered By: Linton Ham on 05/19/2019  14:35:13 -------------------------------------------------------------------------------- Multi-Disciplinary Care Plan Details Patient Name: Date of Service: [name] 05/19/2019 1:00 PM Medical Record [id number] Patient Account Number: [id number] Date of Birth/Sex: Treating RN: 1956-04-19 (63 y.o. [name] Primary Care Kealohilani Maiorino: [name] Other Clinician: Referring Devyon Keator: Treating Murphy Bundick/Extender:Robson, [name], [name] in Treatment: 42 Active Inactive Venous Leg Ulcer Nursing Diagnoses: Actual venous Insuffiency (use after diagnosis is confirmed) Knowledge deficit related to disease process and management Goals: Non-invasive venous studies are completed as ordered Date Initiated: 09/17/2018 Date Inactivated: 09/23/2018 Target Resolution Date: 09/23/2018 Goal Status: Met Patient will maintain optimal edema control Date Initiated: 09/17/2018 Target Resolution Date: 06/17/2019 Goal Status: Active Patient/caregiver will verbalize understanding of disease process and disease management Date Initiated: 09/17/2018 Date Inactivated: 09/23/2018 Target Resolution Date: 10/15/2018 Goal Status: Met Interventions: Assess peripheral edema status every visit. Compression as ordered Provide education on venous insufficiency Treatment Activities: Therapeutic compression applied : 09/17/2018 Notes: Electronic Signature(s) Signed: 05/19/2019 6:37:14 PM By: Deon Pilling Entered By: Deon Pilling on 05/19/2019 18:05:22 -------------------------------------------------------------------------------- Pain Assessment Details Patient Name: Date of Service: KEMAURI, MUSA 05/19/2019 1:00 PM Medical Record [id number] Patient Account Number: [id number] Date of Birth/Sex: Treating RN: [birth year] (63 y.o. [name] Primary Care Ayeshia Coppin: [name] Other Clinician: Referring Jayelyn Barno: Treating Maryrose Colvin/Extender:Robson,  [name], [name] in Treatment: 42 Active Problems Location of Pain Severity and Description of Pain Patient Has Paino No Site Locations Pain Management and Medication Current Pain Management: Electronic Signature(s) Signed: 05/23/2019 5:51:44 PM By: Levan Hurst RN, BSN Entered  By: Levan Hurst on 05/19/2019 13:38:13 -------------------------------------------------------------------------------- Patient/Caregiver Education Details Patient Name: Date of Service: ANTAVIUS, SPERBECK 12/3/2020andnbsp1:00 PM Medical Record [id number] Patient Account Number: [id number] Date of Birth/Gender: Treating RN: January 30, 1956 (62 y.o. [name] Primary Care Physician: [name] Other Clinician: Referring Physician: Treating Physician/Extender:Robson, [name], [name] in Treatment: 73 Education Assessment Education Provided To: Patient Education Topics Provided Venous: Handouts: Managing Venous Disease and Related Ulcers Methods: Explain/Verbal Responses: Reinforcements needed Electronic Signature(s) Signed: 05/19/2019 6:37:14 PM By: Deon Pilling Entered By: Deon Pilling on 05/19/2019 18:05:32 -------------------------------------------------------------------------------- Wound Assessment Details Patient Name: Date of Service: HENLEY, BOETTNER 05/19/2019 1:00 PM Medical Record [id number] Patient Account Number: [id number] Date of Birth/Sex: Treating RN: [birth year] (63 y.o. [name] Primary Care Aayushi Solorzano: [name] Other Clinician: Referring Carley Strickling: Treating Symantha Steeber/Extender:Robson, [name], [name] in Treatment: 42 Wound Status Wound Number: 6 Primary Venous Leg Ulcer Etiology: Wound Location: Right Lower Leg - Posterior, Proximal Wound Open Status: Wounding Event: Gradually Appeared Comorbid Asthma, Sleep Apnea, Arrhythmia, Congestive Date Acquired: 01/06/2019 History: Heart  Failure, Hypertension, Type II Diabetes Weeks Of Treatment: 19 Clustered Wound: No Photos Wound Measurements Length: (cm) 1 % Reduct Width: (cm) 0.7 % Reduct Depth: (cm) 0.1 Epitheli Area: (cm) 0.55 Tunneli Volume: (cm) 0.055 Undermi Wound Description Full Thickness Without Exposed Support Foul Odo Classification: Structures Slough/F Wound Flat and Intact Margin: Exudate Medium Amount: Exudate Serosanguineous Type: Exudate red, brown Color: Wound Bed Granulation Amount: Large (67-100%) Granulation Quality: Pink Fascia Exp Necrotic Amount: None Present (0%) Fat Layer Tendon Exp Muscle Exp Joint Expo Bone Expos r After Cleansing: No ibrino No Exposed Structure osed: No (Subcutaneous Tissue) Exposed: Yes osed: No osed: No sed: No ed: No ion in Area: -39.9% ion in Volume: -41% alization: Large (67-100%) ng: No ning: No Treatment Notes Wound #6 (Right, Proximal, Posterior Lower Leg) 2. Periwound Care Moisturizing lotion 3. Primary Dressing Applied Hydrofera Blue 4. Secondary Dressing Dry Gauze 6. Support Layer Applied 4 layer compression Water quality scientist) Signed: 05/23/2019 5:51:44 PM By: Levan Hurst RN, BSN Signed: 05/24/2019 4:34:42 PM By: Mikeal Hawthorne EMT/HBOT Entered By: Mikeal Hawthorne on 05/23/2019 09:04:38 -------------------------------------------------------------------------------- Wound Assessment Details Patient Name: Date of Service: RICHARD, RITCHEY 05/19/2019 1:00 PM Medical Record [id number] Patient Account Number: [id number] Date of Birth/Sex: Treating RN: December 18, 1955 (63 y.o. [name] Primary Care Lasaro Primm: [name] Other Clinician: Referring Betzabe Bevans: Treating Birttany Dechellis/Extender:Robson, [name], [name] in Treatment: 42 Wound Status Wound Number: 8 Primary Venous Leg Ulcer Etiology: Wound Location: Left Lower Leg - Anterior Wound Open Wounding Event: Gradually  Appeared Status: Date Acquired: 02/14/2019 Comorbid Asthma, Sleep Apnea, Arrhythmia, Congestive Weeks Of Treatment: 13 History: Heart Failure, Hypertension, Type II Diabetes Clustered Wound: No Photos Wound Measurements Length: (cm) 4 % Reduct Width: (cm) 0.4 % Reduct Depth: (cm) 0.1 Epitheli Area: (cm) 1.257 Tunneli Volume: (cm) 0.126 Undermi Wound Description Classification: Full Thickness Without Exposed Support Foul Odo Structures Slough/F Wound Flat and Intact Margin: Exudate Small Amount: Exudate Serosanguineous Type: Exudate red, brown Color: Wound Bed Granulation Amount: Large (67-100%) Granulation Quality: Red Fascia E Necrotic Amount: None Present (0%) Fat Laye Tendon E Muscle E Joint Ex Bone Exp r After Cleansing: No ibrino No Exposed Structure xposed: No r (Subcutaneous Tissue) Exposed: Yes xposed: No xposed: No posed: No osed: No ion in Area: 80% ion in Volume: 79.9% alization: Large (67-100%) ng: No ning: No Treatment Notes Wound #8 (Left, Anterior Lower Leg) 2. Periwound Care Moisturizing lotion 3. Primary Dressing Applied  Hydrofera Blue 4. Secondary Dressing Dry Gauze 6. Support Layer Applied 4 layer compression Water quality scientist) Signed: 05/23/2019 5:51:44 PM By: Levan Hurst RN, BSN Signed: 05/24/2019 4:34:42 PM By: Mikeal Hawthorne EMT/HBOT Entered By: Mikeal Hawthorne on 05/23/2019 09:04:13 -------------------------------------------------------------------------------- Vitals Details Patient Name: Date of Service: [name] 05/19/2019 1:00 PM Medical Record [id number] Patient Account Number: [id number] Date of Birth/Sex: Treating RN: [birth year] (62 y.o. [name] Primary Care Anyjah Roundtree: [name] Other Clinician: Referring Tramayne Sebesta: Treating Jaevian Shean/Extender:Robson, [name], [name] in Treatment: 42 Vital Signs Time Taken: 13:40 Temperature (F): 98.0 Height (in):  73 Pulse (bpm): 53 Weight (lbs): 290 Respiratory Rate (breaths/min): 18 Body Mass Index (BMI): 38.3 Blood Pressure (mmHg): 157/86 Reference Range: 80 - 120 mg / dl Electronic Signature(s) Signed: 05/23/2019 5:51:44 PM By: Levan Hurst RN, BSN Entered By: Levan Hurst on 05/19/2019 13:53:58

## 2019-05-24 NOTE — Progress Notes (Signed)
[name], [name] ([id number]) Visit Report for 03/03/2019 Arrival Information Details Patient Name: Date of Service: [name], [name] 03/03/2019 2:15 PM Medical Record [id number] Patient Account Number: [id number] Date of Birth/Sex: Treating RN: April 10, 1956 (63 y.o. [name] Primary Care Arwa Yero: [name] Other Clinician: Referring Billey Wojciak: Treating Abubakar Crispo/Extender:Robson, [name], [name] in Treatment: 33 Visit Information History Since Last Visit Added or deleted any medications: No Patient Arrived: Ambulatory Any new allergies or adverse reactions: No Arrival Time: 14:01 Had a fall or experienced change in No Accompanied By: self activities of daily living that may affect Transfer Assistance: None risk of falls: Patient Identification Verified: Yes Signs or symptoms of abuse/neglect since last No Secondary Verification Process Yes visito Completed: Hospitalized since last visit: No Patient Requires Transmission-Based No Implantable device outside of the clinic excluding No Precautions: cellular tissue based products placed in the center Patient Has Alerts: Yes since last visit: Patient Alerts: Patient on Blood Has Dressing in Place as Prescribed: Yes Thinner Pain Present Now: Yes xarelto R TBI = .86, L TBI = .87 Electronic Signature(s) Signed: 03/03/2019 5:54:30 PM By: Baruch Gouty RN, BSN Entered By: Baruch Gouty on 03/03/2019 14:02:42 -------------------------------------------------------------------------------- Encounter Discharge Information Details Patient Name: Date of Service: [name]. 03/03/2019 2:15 PM Medical Record [id number] Patient Account Number: [id number] Date of Birth/Sex: Treating RN: 1956/02/03 (63 y.o. [name] Primary Care Trenice Mesa: [name] Other Clinician: Referring Zeplin Aleshire: Treating Jalayiah Bibian/Extender:Robson, [name], [name] in  Treatment: 31 Encounter Discharge Information Items Post Procedure Vitals Discharge Condition: Stable Temperature (F): 98.4 Ambulatory Status: Ambulatory Pulse (bpm): 61 Discharge Destination: Home Respiratory Rate (breaths/min): 18 Transportation: Private Auto Blood Pressure (mmHg): 135/80 Accompanied By: self Schedule Follow-up Appointment: Yes Clinical Summary of Care: Patient Declined Electronic Signature(s) Signed: 05/24/2019 3:04:22 PM By: Carlene Coria RN Entered By: Carlene Coria on 03/03/2019 15:21:04 -------------------------------------------------------------------------------- Lower Extremity Assessment Details Patient Name: Date of Service: [name] 03/03/2019 2:15 PM Medical Record [id number] Patient Account Number: [id number] Date of Birth/Sex: Treating RN: Mar 23, 1956 (63 y.o. [name] Primary Care Marilena Trevathan: [name] Other Clinician: Referring Irven Ingalsbe: Treating Keari Miu/Extender:Robson, [name], [name] in Treatment: 31 Edema Assessment Assessed: [Left: No] [Right: No] Edema: [Left: Yes] [Right: No] Calf Left: Right: Point of Measurement: cm From Medial Instep 46.5 cm 39 cm Ankle Left: Right: Point of Measurement: cm From Medial Instep 26.5 cm 23.1 cm Vascular Assessment Pulses: Dorsalis Pedis Palpable: [Left:Yes] [Right:Yes] Electronic Signature(s) Signed: 03/03/2019 5:54:30 PM By: Baruch Gouty RN, BSN Entered By: Baruch Gouty on 03/03/2019 14:13:35 -------------------------------------------------------------------------------- Multi Wound Chart Details Patient Name: Date of Service: [name]. 03/03/2019 2:15 PM Medical Record [phone] [phone] Patient Account Number: [id number] Date of Birth/Sex: Treating RN: February 12, 1956 (63 y.o. [name], [name] Primary Care Twylia Oka: Other Clinician: [name] Referring Palestine Mosco: Treating Tineshia Becraft/Extender:Robson, [name],  [name] in Treatment: 31 Vital Signs Height(in): 47 Pulse(bpm): 88 Weight(lbs): 290 Blood Pressure(mmHg): 135/80 Body Mass Index(BMI): 38 Temperature(F): 98.4 Respiratory 18 Rate(breaths/min): Photos: [1:No Photos] [6:No Photos] [7:No Photos] Wound Location: [1:Right Calf - PosteriorRight Lower Leg - Posterior, Right Lower Leg - Anterior] [6:Proximal] Wounding Event: [1:Gradually Appeared] [6:Gradually Appeared] [7:Trauma] Primary Etiology: [1:Venous Leg Ulcer] [6:Venous Leg Ulcer] [7:Venous Leg Ulcer] Secondary Etiology: [1:Cellulitis] [6:N/A] [7:N/A] Comorbid History: [1:Asthma, Sleep Apnea, Arrhythmia, Congestive Heart Failure, Hypertension, Heart Failure, Hypertension, Heart Failure, Hypertension, Type II Diabetes] [6:Asthma, Sleep Apnea, Arrhythmia, Congestive Type II Diabetes] [7:Asthma, Sleep  Apnea, Arrhythmia, Congestive Type II Diabetes] Date Acquired: [1:04/16/2018] [6:01/06/2019] [7:01/13/2019] Weeks of Treatment: [1:31] [  6:8] [7:7] Wound Status: [1:Open] [6:Open] [7:Open] Measurements L x W x D 0.7x1.3x0.1 [6:1.6x1.4x0.1] [7:1.4x1.2x0.1] (cm) Area (cm) : [1:0.715] [6:1.759] [7:1.319] Volume (cm) : [1:0.071] [6:0.176] [7:0.132] % Reduction in Area: [1:93.00%] [6:-347.60%] [7:-573.00%] % Reduction in Volume: 98.60% [6:-351.30%] [7:-560.00%] Classification: [1:Full Thickness With Exposed Support Structures Exposed Support Structures Exposed Support Structures] [6:Full Thickness Without] [7:Full Thickness Without] Exudate Amount: [1:Small] [6:Small] [7:Small] Exudate Type: [1:Serosanguineous] [6:Serosanguineous] [7:Serosanguineous] Exudate Color: [1:red, brown] [6:red, brown] [7:red, brown] Wound Margin: [1:Flat and Intact] [6:Flat and Intact] [7:Distinct, outline attached] Granulation Amount: [1:Large (67-100%)] [6:Large (67-100%)] [7:Small (1-33%)] Granulation Quality: [1:Red] [6:Pink] [7:Pink] Necrotic Amount: [1:None Present (0%)] [6:None Present (0%)]  [7:Large (67-100%)] Necrotic Tissue: [1:N/A] [6:N/A] [7:Adherent Slough] Exposed Structures: [1:Fat Layer (Subcutaneous Fat Layer (Subcutaneous Fat Layer (Subcutaneous Tissue) Exposed: Yes Fascia: No Tendon: No Muscle: No Joint: No Bone: No] [6:Tissue) Exposed: Yes Fascia: No Tendon: No Muscle: No Joint: No Bone: No] [7:Tissue) Exposed: Yes  Fascia: No Tendon: No Muscle: No Joint: No Bone: No] Epithelialization: [1:Medium (34-66%)] [6:Medium (34-66%)] [7:None] Debridement: [1:N/A] [6:N/A] [7:Debridement - Excisional] Pre-procedure [1:N/A] [6:N/A] [7:14:35] Verification/Time Out Taken: Pain Control: [1:N/A] [6:N/A] [7:Lidocaine 4% Topical Solution] Tissue Debrided: [1:N/A] [6:N/A] [7:Subcutaneous, Slough] Level: [1:N/A] [6:N/A] [7:Skin/Subcutaneous Tissue] Debridement Area (sq cm):N/A [6:N/A 1.68] Instrument: [1:N/A] [6:N/A Curette] Bleeding: [1:N/A] [6:N/A Moderate] Hemostasis Achieved: [1:N/A] [6:N/A Pressure] Procedural Pain: [1:N/A] [6:N/A 0] Post Procedural Pain: [1:N/A] [6:N/A 3] Debridement Treatment N/A [6:N/A Procedure was tolerated] Response: [6:well] Post Debridement [1:N/A] [6:N/A 1.4x1.2x0.1] Measurements L x W x D (cm) Post Debridement [1:N/A] [6:N/A 0.132] Volume: (cm) Assessment Notes: [1:N/A] [6:N/A N/A] Procedures Performed: N/A [1:8] [6:N/A Debridement N/A] [7:N/A] Photos: [1:No Photos] [6:N/A N/A] Wound Location: [1:Left Lower Leg - Anterior] [6:N/A N/A] Wounding Event: [1:Gradually Appeared] [6:N/A N/A] Primary Etiology: [1:Venous Leg Ulcer] [6:N/A N/A] Secondary Etiology: [1:N/A] [6:N/A N/A] Comorbid History: [1:Asthma, Sleep Apnea, Arrhythmia, Congestive Heart Failure, Hypertension, Type II Diabetes] [6:N/A N/A] Date Acquired: [1:02/14/2019] [6:N/A N/A] Weeks of Treatment: [1:2] [6:N/A N/A] Wound Status: [1:Open] [6:N/A N/A] Measurements L x W x D 8.6x2.7x0.1 [6:N/A N/A] (cm) Area (cm) : [1:18.237] [6:N/A N/A] Volume (cm) : [1:1.824] [6:N/A N/A] %  Reduction in Area: [1:-190.30%] [6:N/A N/A] % Reduction in Volume: -190.40% [6:N/A N/A] Classification: [1:Full Thickness Without Exposed Support Structures] [6:N/A N/A] Exudate Amount: [1:Medium] [6:N/A N/A] Exudate Type: [1:Serosanguineous] [6:N/A N/A] Exudate Color: [1:red, brown] [6:N/A N/A] Wound Margin: [1:Distinct, outline attached N/A] [6:N/A] Granulation Amount: [1:Small (1-33%)] [6:N/A N/A] Granulation Quality: [1:Red] [6:N/A N/A] Necrotic Amount: [1:Large (67-100%)] [6:N/A N/A] Necrotic Tissue: [1:Eschar, Adherent Slough N/A] [6:N/A] Exposed Structures: [1:Fat Layer (Subcutaneous N/A Tissue) Exposed: Yes Fascia: No Tendon: No Muscle: No Joint: No Bone: No] [6:N/A] Epithelialization: [1:None] [6:N/A N/A] Debridement: [1:Debridement - Excisional N/A] [6:N/A] Pre-procedure [1:14:35] [6:N/A N/A] Verification/Time Out Taken: Pain Control: [1:Lidocaine 4% Topical Solution] [6:N/A N/A] Tissue Debrided: [1:Subcutaneous, Slough] [6:N/A N/A] Level: [1:Skin/Subcutaneous Tissue N/A] [6:N/A] Debridement Area (sq cm):23.22 [6:N/A] [7:N/A] Instrument: [1:Curette] [6:N/A] [7:N/A] Bleeding: [1:Moderate] [6:N/A] [7:N/A] Hemostasis Achieved: [1:Pressure] [6:N/A] [7:N/A] Procedural Pain: [1:0] [6:N/A] [7:N/A] Post Procedural Pain: [1:3] [6:N/A] [7:N/A] Debridement Treatment Procedure was tolerated [6:N/A] [7:N/A] Response: [1:well] Post Debridement [1:8.6x2.7x0.1] [6:N/A] [7:N/A] Measurements L x W x D (cm) Post Debridement [1:1.824] [6:N/A] [7:N/A] Volume: (cm) Assessment Notes: [1:periwound area red and tender] [6:N/A N/A] [7:N/A N/A] Treatment Notes Electronic Signature(s) Signed: 03/03/2019 5:09:12 PM By: Linton Ham MD Signed: 03/03/2019 5:51:26 PM By: Deon Pilling Entered By: Linton Ham on 03/03/2019 14:47:44 -------------------------------------------------------------------------------- Multi-Disciplinary Care Plan Details Patient Name: Date of Service: [name],  Dujuan Stankowski 03/03/2019 2:15 PM Medical Record [id number] Patient Account Number: [id number] Date of Birth/Sex: Treating RN: [birth year] (63 y.o. [name] Primary Care Tiger Spieker: [name] Other Clinician: Referring Lady Wisham: Treating Glenna Brunkow/Extender:Robson, [name], [name] in Treatment: 31 Active Inactive Venous Leg Ulcer Nursing Diagnoses: Actual venous Insuffiency (use after diagnosis is confirmed) Knowledge deficit related to disease process and management Goals: Non-invasive venous studies are completed as ordered Date Initiated: 09/17/2018 Date Inactivated: 09/23/2018 Target Resolution Date: 09/23/2018 Goal Status: Met Patient will maintain optimal edema control Date Initiated: 09/17/2018 Target Resolution Date: 03/18/2019 Goal Status: Active Patient/caregiver will verbalize understanding of disease process and disease management Date Initiated: 09/17/2018 Date Inactivated: 09/23/2018 Target Resolution Date: 10/15/2018 Goal Status: Met Interventions: Assess peripheral edema status every visit. Compression as ordered Provide education on venous insufficiency Treatment Activities: Therapeutic compression applied : 09/17/2018 Notes: Electronic Signature(s) Signed: 03/03/2019 5:51:26 PM By: Deon Pilling Entered By: Deon Pilling on 03/03/2019 17:33:20 -------------------------------------------------------------------------------- Pain Assessment Details Patient Name: Date of Service: [name], [name] 03/03/2019 2:15 PM Medical Record [id number] Patient Account Number: [id number] Date of Birth/Sex: Treating RN: 03/15/1956 (63 y.o. [name] Primary Care Sugar Vanzandt: [name] Other Clinician: Referring Padraig Nhan: Treating Faustine Tates/Extender:Robson, [name], [name] in Treatment: 31 Active Problems Location of Pain Severity and Description of Pain Patient Has Paino Yes Site Locations Pain Location: Pain in  Ulcers With Dressing Change: Yes Duration of the Pain. Constant / Intermittento Intermittent Rate the pain. Current Pain Level: 4 Worst Pain Level: 8 Least Pain Level: 1 Character of Pain Describe the Pain: Sharp, Throbbing Pain Management and Medication Current Pain Management: Medication: Yes Is the Current Pain Management Adequate: Adequate Rest: Yes How does your wound impact your activities of daily livingo Sleep: Yes Bathing: No Appetite: No Relationship With Others: No Bladder Continence: No Emotions: Yes Bowel Continence: No Work: Yes Toileting: No Drive: No Dressing: No Hobbies: Astronomer) Signed: 03/03/2019 5:54:30 PM By: Baruch Gouty RN, BSN Entered By: Baruch Gouty on 03/03/2019 14:12:00 -------------------------------------------------------------------------------- Patient/Caregiver Education Details Patient Name: Date of Service: [name] 9/17/2020andnbsp2:15 PM Medical Record [id number] Patient Account Number: [id number] Date of Birth/Gender: Treating RN: 1956-02-28 (63 y.o. [name] Primary Care Physician: [name] Other Clinician: Referring Physician: Treating Physician/Extender:Robson, [name], [name] in Treatment: 31 Education Assessment Education Provided To: Patient Education Topics Provided Venous: Handouts: Controlling Swelling with Compression Stockings Methods: Explain/Verbal Responses: Reinforcements needed Electronic Signature(s) Signed: 03/03/2019 5:51:26 PM By: Deon Pilling Entered By: Deon Pilling on 03/03/2019 17:33:32 -------------------------------------------------------------------------------- Wound Assessment Details Patient Name: Date of Service: [name], [place] 03/03/2019 2:15 PM Medical Record [id number] Patient Account Number: [id number] Date of Birth/Sex: Treating RN: 06-04-1956 (63 y.o. [name] Primary Care Marolyn Urschel:  [name] Other Clinician: Referring Henrick Mcgue: Treating Caeley Dohrmann/Extender:Robson, [name], [name] in Treatment: 31 Wound Status Wound Number: 1 Primary Venous Leg Ulcer Etiology: Wound Location: Right Calf - Posterior Secondary Cellulitis Wounding Event: Gradually Appeared Etiology: Date Acquired: 04/16/2018 Wound Open Weeks Of Treatment: 31 Status: Clustered Wound: No Comorbid Asthma, Sleep Apnea, Arrhythmia, History: Congestive Heart Failure, Hypertension, Type II Diabetes Photos Wound Measurements Length: (cm) 0.7 % Reductio Width: (cm) 1.3 % Reductio Depth: (cm) 0.1 Epithelial Area: (cm) 0.715 Tunneling Volume: (cm) 0.071 Undermini Wound Description Full Thickness With Exposed Support Classification: Structures Wound Flat and Intact Margin: Exudate Small Amount: Exudate Type: Serosanguineous Exudate Color: red, brown Wound Bed Granulation Amount: Large (67-100%) Granulation Quality: Red Necrotic Amount: None Present (0%) Foul Odor After Cleansing:  No Slough/Fibrino No Exposed Structure Fascia Exposed: No Fat Layer (Subcutaneous Tissue) Exposed: Yes Tendon Exposed: No Muscle Exposed: No Joint Exposed: No Bone Exposed: No n in Area: 93% n in Volume: 98.6% ization: Medium (34-66%) : No ng: No Electronic Signature(s) Signed: 03/04/2019 3:55:03 PM By: Mikeal Hawthorne EMT/HBOT Signed: 03/04/2019 5:53:09 PM By: Baruch Gouty RN, BSN Previous Signature: 03/03/2019 5:54:30 PM Version By: Baruch Gouty RN, BSN Entered By: Mikeal Hawthorne on 03/04/2019 08:55:03 -------------------------------------------------------------------------------- Wound Assessment Details Patient Name: Date of Service: [name] 03/03/2019 2:15 PM Medical Record [id number] Patient Account Number: [id number] Date of Birth/Sex: Treating RN: 1955/11/10 (63 y.o. [name], Tammi Klippel Primary Care Skye Plamondon: [name] Other  Clinician: Referring Margarie Mcguirt: Treating Shawni Volkov/Extender:Robson, [name], [name] in Treatment: 31 Wound Status Wound Number: 11 Primary Diabetic Wound/Ulcer of the Lower Extremity Etiology: Wound Location: Left Knee Wound Open Wounding Event: Blister Status: Date Acquired: 02/25/2019 Comorbid Asthma, Sleep Apnea, Arrhythmia, Congestive Weeks Of Treatment: 0 History: Heart Failure, Hypertension, Type II Diabetes Clustered Wound: No Photos Wound Measurements Length: (cm) 2.5 Width: (cm) 2 Depth: (cm) 0.1 Area: (cm) 3.927 Volume: (cm) 0.393 Wound Description Classification: Grade 1 Wound Margin: Distinct, outline attached Exudate Amount: None Present Wound Bed Granulation Amount: None Present (0%) Necrotic Amount: Large (67-100%) Necrotic Quality: Eschar After Cleansing: No brino Yes Exposed Structure posed: No (Subcutaneous Tissue) Exposed: No posed: No posed: No osed: No sed: No % Reduction in Area: 0% % Reduction in Volume: 0% Epithelialization: Small (1-33%) Tunneling: No Undermining: No Foul Odor Slough/Fi Fascia Ex Fat Layer Tendon Ex Muscle Ex Joint Exp Bone Expo Electronic Signature(s) Signed: 03/04/2019 3:55:03 PM By: Mikeal Hawthorne EMT/HBOT Signed: 03/04/2019 5:30:49 PM By: Deon Pilling Previous Signature: 03/03/2019 5:51:26 PM Version By: Deon Pilling Entered By: Mikeal Hawthorne on 03/04/2019 08:59:05 -------------------------------------------------------------------------------- Wound Assessment Details Patient Name: Date of Service: [name] 03/03/2019 2:15 PM Medical Record [id number] Patient Account Number: [id number] Date of Birth/Sex: Treating RN: 1956-04-08 (63 y.o. [name] Primary Care Phila Shoaf: [name] Other Clinician: Referring Dalary Hollar: Treating Elody Kleinsasser/Extender:Robson, [name], [name] in Treatment: 31 Wound Status Wound Number: 6 Primary Venous Leg  Ulcer Etiology: Wound Location: Right Lower Leg - Posterior, Proximal Wound Open Status: Wounding Event: Gradually Appeared Comorbid Asthma, Sleep Apnea, Arrhythmia, Congestive Date Acquired: 01/06/2019 History: Heart Failure, Hypertension, Type II Diabetes Weeks Of Treatment: 8 Clustered Wound: No Photos Wound Measurements Length: (cm) 1.6 % Reduct Width: (cm) 1.4 % Reduct Depth: (cm) 0.1 Epitheli Area: (cm) 1.759 Tunneli Volume: (cm) 0.176 Undermi Wound Description Classification: Full Thickness Without Exposed Support Foul Odo Structures Slough/F Wound Flat and Intact Margin: Exudate Small Amount: Exudate Serosanguineous Type: Exudate red, brown Color: Wound Bed Granulation Amount: Large (67-100%) Granulation Quality: Pink Fascia E Necrotic Amount: None Present (0%) Fat Laye Tendon E Muscle E Joint Ex Bone Exp r After Cleansing: No ibrino Yes Exposed Structure xposed: No r (Subcutaneous Tissue) Exposed: Yes xposed: No xposed: No posed: No osed: No ion in Area: -347.6% ion in Volume: -351.3% alization: Medium (34-66%) ng: No ning: No Electronic Signature(s) Signed: 03/04/2019 3:55:03 PM By: Mikeal Hawthorne EMT/HBOT Signed: 03/04/2019 5:53:09 PM By: Baruch Gouty RN, BSN Previous Signature: 03/03/2019 5:54:30 PM Version By: Baruch Gouty RN, BSN Entered By: Mikeal Hawthorne on 03/04/2019 08:54:39 -------------------------------------------------------------------------------- Wound Assessment Details Patient Name: Date of Service: [name] 03/03/2019 2:15 PM Medical Record [id number] Patient Account Number: [id number] Date of Birth/Sex: Treating RN: 1956/03/09 (63 y.o. [name] Primary Care Sabriah Hobbins: [name] Other  Clinician: Referring Geremiah Fussell: Treating Bren Borys/Extender:Robson, [name], [name] in Treatment: 31 Wound Status Wound Number: 7 Primary Venous Leg Ulcer Etiology: Wound Location:  Right Lower Leg - Anterior Wound Open Wounding Event: Trauma Status: Date Acquired: 01/13/2019 Comorbid Asthma, Sleep Apnea, Arrhythmia, Congestive Weeks Of Treatment: 7 History: Heart Failure, Hypertension, Type II Diabetes Clustered Wound: No Photos Wound Measurements Length: (cm) 1.4 % Reduct Width: (cm) 1.2 % Reduct Depth: (cm) 0.1 Epitheli Area: (cm) 1.319 Tunneli Volume: (cm) 0.132 Undermi Wound Description Classification: Full Thickness Without Exposed Support Foul Odo Structures Slough/F Wound Distinct, outline attached Margin: Exudate Small Amount: Exudate Serosanguineous Type: Exudate red, brown Color: Wound Bed Granulation Amount: Small (1-33%) Granulation Quality: Pink Fascia Ex Necrotic Amount: Large (67-100%) Fat Layer Necrotic Quality: Adherent Slough Tendon Ex Muscle Ex Joint Exp Bone Expo r After Cleansing: No ibrino No Exposed Structure posed: No (Subcutaneous Tissue) Exposed: Yes posed: No posed: No osed: No sed: No ion in Area: -573% ion in Volume: -560% alization: None ng: No ning: No Electronic Signature(s) Signed: 03/04/2019 3:55:03 PM By: Mikeal Hawthorne EMT/HBOT Signed: 03/04/2019 5:53:09 PM By: Baruch Gouty RN, BSN Previous Signature: 03/03/2019 5:54:30 PM Version By: Baruch Gouty RN, BSN Entered By: Mikeal Hawthorne on 03/04/2019 08:55:25 -------------------------------------------------------------------------------- Wound Assessment Details Patient Name: Date of Service: [name] 03/03/2019 2:15 PM Medical Record [id number] Patient Account Number: [id number] Date of Birth/Sex: Treating RN: 1955/12/08 (63 y.o. [name] Primary Care Paolo Okane: [name] Other Clinician: Referring Ellieanna Funderburg: Treating Estil Vallee/Extender:Robson, [name], [name] in Treatment: 31 Wound Status Wound Number: 8 Primary Venous Leg Ulcer Etiology: Wound Location: Left Lower Leg - Anterior Wound  Open Wounding Event: Gradually Appeared Status: Date Acquired: 02/14/2019 Comorbid Asthma, Sleep Apnea, Arrhythmia, Congestive Weeks Of Treatment: 2 History: Heart Failure, Hypertension, Type II Diabetes Clustered Wound: No Photos Wound Measurements Length: (cm) 8.6 % Reductio Width: (cm) 2.7 % Reduct Depth: (cm) 0.1 Epitheli Area: (cm) 18.237 Tunneli Volume: (cm) 1.824 Undermi Wound Description Full Thickness Without Exposed Support Foul Od Classification: Structures Slough/ Wound Distinct, outline attached Margin: Exudate Medium Amount: Exudate Serosanguineous Type: Exudate red, brown Color: Wound Bed Granulation Amount: Small (1-33%) Granulation Quality: Red Fascia E Necrotic Amount: Large (67-100%) Fat Laye Necrotic Quality: Eschar, Adherent Slough Tendon E Muscle E Joint Ex Bone Exp or After Cleansing: No Fibrino Yes Exposed Structure xposed: No r (Subcutaneous Tissue) Exposed: Yes xposed: No xposed: No posed: No osed: No n in Area: -190.3% ion in Volume: -190.4% alization: None ng: No ning: No Assessment Notes periwound area red and tender Electronic Signature(s) Signed: 03/04/2019 3:55:03 PM By: Mikeal Hawthorne EMT/HBOT Signed: 03/04/2019 5:53:09 PM By: Baruch Gouty RN, BSN Previous Signature: 03/03/2019 5:54:30 PM Version By: Baruch Gouty RN, BSN Entered By: Mikeal Hawthorne on 03/04/2019 08:58:21 -------------------------------------------------------------------------------- Vitals Details Patient Name: Date of Service: [name]. 03/03/2019 2:15 PM Medical Record [phone] Patient Account Number: [id number] Date of Birth/Sex: Treating RN: [birth year] (63 y.o. [name] Primary Care Deryl Ports: [name] Other Clinician: Referring Adrik Khim: Treating Georgene Kopper/Extender:Robson, [name], [name] in Treatment: 31 Vital Signs Time Taken: 14:02 Temperature (F): 98.4 Height (in): 73 Pulse (bpm):  61 Source: Stated Respiratory Rate (breaths/min): 18 Weight (lbs): 290 Blood Pressure (mmHg): 135/80 Source: Stated Reference Range: 80 - 120 mg / dl Body Mass Index (BMI): 38.3 Electronic Signature(s) Signed: 03/03/2019 5:54:30 PM By: Baruch Gouty RN, BSN Entered By: Baruch Gouty on 03/03/2019 14:03:14

## 2019-05-25 NOTE — Progress Notes (Signed)
[name], [name] ([name]) Visit Report for 04/19/2019 Cellular or Tissue Based Product Details Patient Name: Date of Service: [name], [name] 04/19/2019 10:00 AM Medical Record [name] Patient Account Number: [id number] Date of Birth/Sex: 1955/09/17 (63 y.o. M) Treating RN: Carlene Coria Primary Care Provider: [name] Other Clinician: Referring Provider: Treating Provider/Extender:Robson, [name], [name] in Treatment: 37 Cellular or Tissue Based Wound #6 Right,Proximal,Posterior Lower Leg Product Type Applied to: Performed By: Physician Ricard Dillon., MD Cellular or Tissue Based Apligraf Product Type: Level of Consciousness (Pre- Awake and Alert procedure): Pre-procedure Yes - 10:48 Verification/Time Out Taken: Location: trunk / arms / legs Wound Size (sq cm): 0.42 Product Size (sq cm): 10 Waste Size (sq cm): 0 Amount of Product Applied (sq cm): 10 Instrument Used: Blade, Forceps Lot #: [id number] Order #: 1 Expiration Date: 04/26/2019 Fenestrated: Yes Instrument: Blade Reconstituted: Yes Solution Type: normal saline Solution Amount: 10 Lot #[id number] Expiration Date: 10/13/2020 Secured: Yes Secured With: Steri-Strips Dressing Applied: Yes Primary Dressing: adaptic Procedural Pain: 0 Post Procedural Pain: 0 Response to Treatment: Procedure was tolerated well Level of Consciousness Awake and Alert (Post-procedure): Post Procedure Diagnosis Same as Pre-procedure Electronic Signature(s) Signed: 04/19/2019 6:03:12 PM By: Linton Ham MD Entered By: Linton Ham on 04/19/2019 11:34:19 -------------------------------------------------------------------------------- Cellular or Tissue Based Product Details Patient Name: Date of Service: [name]. 04/19/2019 10:00 AM Medical Record [id number] Patient Account Number: [id number] Date of Birth/Sex: [birth year] (63 y.o. M) Treating RN: Carlene Coria Primary Care Provider: [name] Other Clinician: Referring Provider: Treating Provider/Extender:Robson, [name], [name] in Treatment: 37 Cellular or Tissue Based Wound #7 Right,Anterior Lower Leg Product Type Applied to: Performed By: Physician Ricard Dillon., MD Cellular or Tissue Based Apligraf Product Type: Level of Consciousness (Pre- Awake and Alert procedure): Pre-procedure Yes - 10:48 Verification/Time Out Taken: Location: trunk / arms / legs Wound Size (sq cm): 0.35 Product Size (sq cm): 9 Waste Size (sq cm): 0 Amount of Product Applied (sq cm): 9 Instrument Used: Blade, Forceps Lot #: [id number] Order #: 1 Expiration Date: 04/26/2019 Fenestrated: Yes Instrument: Blade Reconstituted: Yes Solution Type: normal saline Solution Amount: 10 Lot #[id number] Solution Expiration Date: 10/13/2020 Secured: Yes Secured With: Steri-Strips Dressing Applied: Yes Primary Dressing: adaptic Procedural Pain: 0 Post Procedural Pain: 0 Response to Treatment: Procedure was tolerated well Level of Consciousness Awake and Alert (Post-procedure): Post Procedure Diagnosis Same as Pre-procedure Electronic Signature(s) Signed: 04/19/2019 6:03:12 PM By: Linton Ham MD Entered By: Linton Ham on 04/19/2019 11:34:28 -------------------------------------------------------------------------------- Cellular or Tissue Based Product Details Patient Name: Date of Service: [name]. 04/19/2019 10:00 AM Medical Record [id number] Patient Account Number: [id number] Date of Birth/Sex: 1955-06-20 (63 y.o. M) Treating RN: Carlene Coria Primary Care Provider: [name] Other Clinician: Referring Provider: Treating Provider/Extender:Robson, [name], [name] in Treatment: 37 Cellular or Tissue Based Wound #8 Left,Anterior Lower Leg Product Type Applied to: Performed By: Physician Ricard Dillon.,  MD Cellular or Tissue Based Apligraf Product Type: Level of Consciousness (Pre- Awake and Alert procedure): Pre-procedure Yes - 10:48 Verification/Time Out Taken: Location: trunk / arms / legs Wound Size (sq cm): 12.6 Product Size (sq cm): 25 Waste Size (sq cm): 0 Amount of Product Applied (sq cm): 25 Instrument Used: Blade, Forceps Lot #: [id number] Order #: 1 Expiration Date: 04/26/2019 Fenestrated: Yes Instrument: Blade Reconstituted: Yes Solution Type: normal saline Solution Amount: 10 Lot #[id number] Solution Expiration Date: 10/13/2020 Secured: Yes Secured With: Steri-Strips Dressing Applied: Yes Primary Dressing:  adaptic Procedural Pain: 0 Post Procedural Pain: 0 Response to Treatment: Procedure was tolerated well Level of Consciousness Awake and Alert (Post-procedure): Post Procedure Diagnosis Same as Pre-procedure Electronic Signature(s) Signed: 04/19/2019 6:03:12 PM By: Linton Ham MD Entered By: Linton Ham on 04/19/2019 11:34:38 -------------------------------------------------------------------------------- HPI Details Patient Name: Date of Service: [name]. 04/19/2019 10:00 AM Medical Record [id number] Patient Account Number: [id number] Date of Birth/Sex: Treating RN: [birth year] (63 y.o. [name] Primary Care Provider: [name] Other Clinician: Referring Provider: Treating Provider/Extender:Robson, [name], [name] in Treatment: 23 History of Present Illness HPI Description: ADMISSION 07/29/2018 Mr. Noia is a 63 year old man with either prediabetes or diabetes he is on glipizide. In late October to November 2019 he noted a scabbed area on the back of his right calf. He picked this off a few times but it would not heal. He saw his primary physician on 12/5. It was felt at that time that this may actually heal on its own with conservative management. The next visit was on 07/20/2018 noted  the area was a lot worse and arranged for his treatment here. He has been topical antibiotics like Neosporin although he stopped using this when the wound looked worse. He is just been covering this with a clean Band-Aid. He is given Bactrim a week ago and he is finishing this currently. It is made some improvement in the surrounding erythema per the patient. He does not have a history of nonhealing wounds. No prior history of wounds on his legs that he had difficulty healing. No prior skin issues. 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 Past medical history; A. fib status post ablation and recent cardioversion he is on Xarelto. He has a history of systolic heart failure, cervical radiculopathy, cardiomyopathy and type 2 diabetes as discussed ABI in the right leg was noncompressible on the right 2/20; the biopsy I did on the patient last week was negative for malignancy. Culture grew Pseudomonas and methicillin sensitive staph aureus. He is on cefdinir 300 twice a day. I will have to make this a 10 day course. He put him in compression. He states that the redness pain and erythema are a lot better. I suspect the patient has chronic venous insufficiency probably with a secondary cellulitis 2/27; arrives today with copious amounts of drainage coming out of the wound irritating the skin below the wound area. He only renewed the final 3 days of cefdinir today 3/5; came in for a nurse change 3 days ago. Again a lot of drainage noted. Zinc oxide was applied unfortunately the drainage appears to a pool then he has a string of superficial open areas extending down into the Achilles area and some just below the wound. The wound itself does not look too bad. He is completed the antibiotics although apparently there was separation from the original 7 with the last 3 days. Culture grew a few MSSA and a few Pseudomonas 3/12; not too much difference over the last week. He came in for a dressing  change on Monday by our nursing staff. Necrotic debris again over the wound surface. The entire area looks irritated but nontender and I do not think shows obvious evidence of infection. We have not heard anything about the reflux studies. Also notable than in this diabetic man we had noncompressible vessels and although I can feel his pulses easily in his feet I will order arterial studies as well. 3/19-Patient had experience more pain has been dressed  with a silver alginate, the wounds all have necrotic debris, very friable with easy bleeding with any kind of surface debridement including with gauze and Anasept and with a #3 curette. His vascular appointments unfortunately were all canceled on account of the virus outbreak resulting in studies only being done for emergent cases. His pulses are easily palpable in the lower extremity. He has open areas below the primary wound where he states the drainage which included purulent material also with some blood pooled and caused breakdown. 3/26; the patient was changed to Santyl with [facility] backing last week. This was largely because the silver alginate was sticking to the wound. He is still having quite a bit of pain. He tells [place] that the reflux studies and arterial studies we had attempted to arrange through vein and vascular are not being booked until mid May. Culture that was done last week showed both Serratia moderate and a few methicillin sensitive staph aureus I started him on Bactrim DS 1 p.o. twice daily on 3/23 for 7 days. He is here for follow-up. 4/3; patient is on Santyl with Hydrofera Blue. Patient complains of pain. Our intake nurse is noted drainage and some odor. He has completed Bactrim recently for Serratia and a few methicillin sensitive staph aureus. After our staff push hard last week we were able to get venous studies as well as arterial studies. His arterial studies showed on the right great toe pressure of 0.86. On  the left ABI at 1.47 TBI of 0.87. On both sides waveforms were triphasic VENOUS STUDIES showed reflux in the femoral vein, popliteal vein great saphenous vein at the saphenofemoral junction and great saphenous vein at the proximal thigh. Also noted to have age-indeterminate superficial vein thrombosis involving the small saphenous vein. Notable that the patient is already on Xarelto for atrial fibrillation. 4/9; the patient has been to see vascular surgery and reviewed by Dr. [name]. He was felt to have only minimal superficial venous reflux in the right great greater saphenous vein. For this reason he was not felt to be a candidate for laser ablation of the right greater saphenous vein. A wedge cushion was suggested to keep his leg elevated. He does have deep venous reflux on the right. Culture I did last time showed a combination of Serratia Pseudomonas and methicillin sensitive staph aureus. We have been using Hydrofera Blue to the large wound, Santyl to some of the deep punched out satellite lesions distal to it. There is some improvement [id number]; the application for Apligraf was put out for further review for material that we have resubmitted. He has the deep area on the right posterior calf which is large and then 3 small punched out areas beneath this. In general his wounds look a lot better 4/23; the patient has his large original wound and then 2 punched out satellite lesions below it on the right posterior calf. I was usually able to use Apligraf #1 to cover the full surface area of the larger wound. We continued with Santyl and Hydrofera Blue on the 2 punched-out areas 5/7; patient has his large original wound and 2 punched out satellite lesions below it in the right posterior calf. Apligraf #2 today. Major improvement in the big wound in terms of wound depth. Punched-out [place] wounds have a very healthy looking wound bed. 5/21-Patient comes back for his large right posterior calf wound  for which she has been an Apligraf #3 today. Wound depth seems to be better, the punched-out wounds  appear to have healthy bed with some bleeding 6/4; right posterior calf wound is much better. Apligraf #4 today. The major wound has no wound depth at all. Only 1 of the satellite lesions is still open. The patient has very high blood pressure coming in today with a diastolic blood pressure of 130 after lying there for a few minutes it was down to 110. He states that is running [id number] diastolic at home. He was high last week as well I thought he was on 50 mg 1/2 tablet twice daily of losartan I told him to double up on that however it turns out he is actually on 50 twice daily. Course he is run out of his medications prematurely. He has a follow-up with his primary's office next Wednesday. 6/18; patient's wounds continue to improve. The small area laterally is just about closed and there is continued epithelialization on the area on the posterior calf. 7/2; we applied his last Apligraf last visit. Early the next week there was a lot of purulent looking drainage that I cultured this grew staph and Pseudomonas however when we had him back for the next visit to 3 days later everything looked a lot better. He did not receive systemic antibiotics. Fortunately we did not have to remove the Apligraf. He has had heart trouble this week he was having an ablation apparently they have discovered a clot in his left atrium they have changed all his anticoagulants as well as his blood pressure medication. 7/16; using Hydrofera Blue. We are making nice progress towards closure. He has not yet ordered his compression stockings he has his measurements 7/23; dimensions are better. He has a new satellite area superiorly. Both wounds cauterized with silver nitrate. 7/30; absolutely no change in the major wound on the posterior part of the right calf. We have been using Hydrofera Blue for a prolonged period of time  and really had a nice improvement but over the last few weeks this is stalled. There is no depth. He arrived in clinic today with a new area on the right anterior tibia which apparently was an "ingrown hair". It is clearly an open area. This looks like a wrap injury although he was not really aware of it. 8/31; since the patient was last in clinic he was admitted to hospital from 8/17 through 8/22. He had had multiple falls at home including one off a ladder. He was admitted to hospital with mild COVID-19 viral pneumonia. Noted to be hyperkalemic and in acute renal failure. He has chronic systolic heart failure with ejection fraction of 35%. He arrives back in clinic with the original wound on the posterior aspect of the right lower calf looking a lot better. He has eschared areas from falls and lacerations on his anterior knees bilaterally just below the patella and on the mid part of his tibia bilaterally. He seems to have a small area on the right lateral ankle as well 9/10- Patient comes to clinic with a 2 new wounds one on the left anterior shin and one on the right posterior leg both the left knee and right knee areas are healed up. We have been using Santyl to the left anterior leg and silver alginate to right posterior leg wounds. 9/17; the patient is original wound looks good on the posterior right calf. He has traumatic areas on the left anterior shin left anterior patellar tendon and the right mid tibia area. Except for the right posterior calf all required debridement. We  have been using Santyl on these areas and silver alginate posteriorly right calf 9/24; the original wound on the posterior calf on the right looks good. This is healthy. There are traumatic wounds in the left anterior shin, left anterior patella in the right mid tibia area are about the same. He has dusky erythema on the left anterior tibia which led me to give him antibiotics last week. This does not look too much  different. This is nontender and I wonder if this is all just venous inflammation. 10/2; the patient has 2 open areas on the right posterior calf both of these look good we have been using silver alginate. He has traumatic wounds on the left and right mid tibia areas. The surface of these wounds is still not ready for closure. We have been using Iodoflex. Finally has areas on the left knee. The latter wounds are all traumatic after a fall 10/9. His original wounds of the right posterior calf are superficial and progressing towards closure. His traumatic wounds on the left anterior tibia and right anterior tibia are considerably improved in terms of the wound surface. He asked me about a skin graft in these areas I do not think this is going to be necessary. Change from Iodosorb to [facility]. The area on the left knee is just about closed as well 10/16; his original wound on the right posterior calf very superficial looks healthy. He has more recent traumatic wounds on the left anterior tibia and right anterior tibia both of these have better looking surfaces and measuring smaller. Might be beneficial for an advanced treatment product. The area on his left knee has a small superficial scab and we are going to close that when 10/23; his original wound areas on the right posterior calf continue to improve. The more recently traumatic wounds on the left anterior tibia and right anterior tibia both look better in terms of surfaces. No debridement required. We have been using Hydrofera Blue. He is approved for Apligraf 11/3; Apligraf #1 applied to the left anterior tibia right anterior tibia and to 2 small areas remaining on the right posterior calf which were part of his initial wound area. Electronic Signature(s) Signed: 04/19/2019 6:03:12 PM By: Linton Ham MD Entered By: Linton Ham on 04/19/2019  11:03:33 -------------------------------------------------------------------------------- Physical Exam Details Patient Name: Date of Service: [name] 04/19/2019 10:00 AM Medical Record [id number] Patient Account Number: [id number] Date of Birth/Sex: Treating RN: 04-28-1956 (63 y.o. [name] Primary Care Provider: [name] Other Clinician: Referring Provider: Treating Provider/Extender:Robson, [name], [name] in Treatment: 37 Constitutional Sitting or standing Blood Pressure is within target range for patient.. Pulse regular and within target range for patient.[name] Respirations regular, non-labored and within target range.. Temperature is normal and within the target range for the patient.[name] Appears in no distress. Notes Wound exam; left anterior and right anterior shin both looked healthy. No debridement required Apligraf #1 applied. 2 small areas still remain on the right posterior calf Apligraf applied as well here. Edema control was good peripheral pulses palpable Electronic Signature(s) Signed: 04/19/2019 6:03:12 PM By: Linton Ham MD Entered By: Linton Ham on 04/19/2019 11:04:21 -------------------------------------------------------------------------------- Physician Orders Details Patient Name: Date of Service: [name] 04/19/2019 10:00 AM Medical Record [id number] Patient Account Number: [id number] Date of Birth/Sex: Treating RN: [birth year] (63 y.o. [name] Primary Care Provider: [name] Other Clinician: Referring Provider: Treating Provider/Extender:Robson, [name], [name] in Treatment: [phone] Verbal / Phone Orders: No Diagnosis  Coding ICD-10 Coding Code Description [phone] Non-pressure chronic ulcer of right calf with fat layer exposed E11.622 Type 2 diabetes mellitus with other skin ulcer I87.321 Chronic venous hypertension (idiopathic) with inflammation of  right lower extremity S81.811D Laceration without foreign body, right lower leg, subsequent encounter S81.812D Laceration without foreign body, left lower leg, subsequent encounter L03.115 Cellulitis of right lower limb Follow-up Appointments Return Appointment in 2 weeks. - Tuesday Nurse Visit: - 1 week Dressing Change Frequency Do not change entire dressing for one week. - both lower legs. Skin Barriers/Peri-Wound Care Moisturizing lotion - both legs Wound Cleansing May shower with protection. Primary Wound Dressing Wound #6 Right,Proximal,Posterior Lower Leg Skin Substitute Application - Apligraft #1 Wound #7 Right,Anterior Lower Leg Skin Substitute Application - Apligraft #1 Wound #8 Left,Anterior Lower Leg Skin Substitute Application - Apligraft #1 Secondary Dressing Wound #6 Right,Proximal,Posterior Lower Leg ABD pad Kerramax - or zetvuit Wound #7 Right,Anterior Lower Leg ABD pad Kerramax - or zetvuit Wound #8 Left,Anterior Lower Leg ABD pad Kerramax - or zetvuit Edema Control 4 layer compression - Bilateral Avoid standing for long periods of time Elevate legs to the level of the heart or above for 30 minutes daily and/or when sitting, a frequency of: - 3-4 times a day. Electronic Signature(s) Signed: 04/19/2019 6:03:12 PM By: Linton Ham MD Signed: 05/25/2019 12:05:18 PM By: Carlene Coria RN Entered By: Carlene Coria on 04/19/2019 11:04:50 -------------------------------------------------------------------------------- Problem List Details Patient Name: Date of Service: [name] 04/19/2019 10:00 AM Medical Record [id number] Patient Account Number: [id number] Date of Birth/Sex: Treating RN: December 04, 1955 (63 y.o. [name]) Dolores Lory, Morey Hummingbird Primary Care Provider: [name] Other Clinician: Referring Provider: Treating Provider/Extender:Robson, [name], [name] in Treatment: 37 Active Problems ICD-10 Evaluated Encounter Code  Description Active Date Today Diagnosis L97.212 Non-pressure chronic ulcer of right calf with fat layer 07/29/2018 No Yes exposed E11.622 Type 2 diabetes mellitus with other skin ulcer 07/29/2018 No Yes I87.321 Chronic venous hypertension (idiopathic) with 07/29/2018 No Yes inflammation of right lower extremity S81.811D Laceration without foreign body, right lower leg, 02/14/2019 No Yes subsequent encounter S81.812D Laceration without foreign body, left lower leg, 02/14/2019 No Yes subsequent encounter L03.115 Cellulitis of right lower limb 09/24/2018 No Yes Inactive Problems Resolved Problems Electronic Signature(s) Signed: 04/19/2019 6:03:12 PM By: Linton Ham MD Entered By: Linton Ham on 04/19/2019 11:03:00 -------------------------------------------------------------------------------- Progress Note Details Patient Name: Date of Service: [name] 04/19/2019 10:00 AM Medical Record [id number] Patient Account Number: [id number] Date of Birth/Sex: Treating RN: March 07, 1956 (63 y.o. [name] Primary Care Provider: [name] Other Clinician: Referring Provider: Treating Provider/Extender:Robson, [name], [name] in Treatment: 37 Subjective History of Present Illness (HPI) ADMISSION 07/29/2018 Mr. Boese is a 63 year old man with either prediabetes or diabetes he is on glipizide. In late October to November 2019 he noted a scabbed area on the back of his right calf. He picked this off a few times but it would not heal. He saw his primary physician on 12/5. It was felt at that time that this may actually heal on its own with conservative management. The next visit was on 07/20/2018 noted the area was a lot worse and arranged for his treatment here. He has been topical antibiotics like Neosporin although he stopped using this when the wound looked worse. He is just been covering this with a clean Band-Aid. He is given Bactrim a week ago  and he is finishing this currently. It is made some improvement in the surrounding erythema per the patient.  He does not have a history of nonhealing wounds. No prior history of wounds on his legs that he had difficulty healing. No prior skin issues. 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 Past medical history; A. fib status post ablation and recent cardioversion he is on Xarelto. He has a history of systolic heart failure, cervical radiculopathy, cardiomyopathy and type 2 diabetes as discussed ABI in the right leg was noncompressible on the right 2/20; the biopsy I did on the patient last week was negative for malignancy. Culture grew Pseudomonas and methicillin sensitive staph aureus. He is on cefdinir 300 twice a day. I will have to make this a 10 day course. He put him in compression. He states that the redness pain and erythema are a lot better. I suspect the patient has chronic venous insufficiency probably with a secondary cellulitis 2/27; arrives today with copious amounts of drainage coming out of the wound irritating the skin below the wound area. He only renewed the final 3 days of cefdinir today 3/5; came in for a nurse change 3 days ago. Again a lot of drainage noted. Zinc oxide was applied unfortunately the drainage appears to a pool then he has a string of superficial open areas extending down into the Achilles area and some just below the wound. The wound itself does not look too bad. He is completed the antibiotics although apparently there was separation from the original 7 with the last 3 days. Culture grew a few MSSA and a few Pseudomonas 3/12; not too much difference over the last week. He came in for a dressing change on Monday by our nursing staff. Necrotic debris again over the wound surface. The entire area looks irritated but nontender and I do not think shows obvious evidence of infection. We have not heard anything about the reflux studies. Also notable  than in this diabetic man we had noncompressible vessels and although I can feel his pulses easily in his feet I will order arterial studies as well. 3/19-Patient had experience more pain has been dressed with a silver alginate, the wounds all have necrotic debris, very friable with easy bleeding with any kind of surface debridement including with gauze and Anasept and with a #3 curette. His vascular appointments unfortunately were all canceled on account of the virus outbreak resulting in studies only being done for emergent cases. His pulses are easily palpable in the lower extremity. He has open areas below the primary wound where he states the drainage which included purulent material also with some blood pooled and caused breakdown. 3/26; the patient was changed to Santyl with [facility] backing last week. This was largely because the silver alginate was sticking to the wound. He is still having quite a bit of pain. He tells [place] that the reflux studies and arterial studies we had attempted to arrange through vein and vascular are not being booked until mid May. Culture that was done last week showed both Serratia moderate and a few methicillin sensitive staph aureus I started him on Bactrim DS 1 p.o. twice daily on 3/23 for 7 days. He is here for follow-up. 4/3; patient is on Santyl with Hydrofera Blue. Patient complains of pain. Our intake nurse is noted drainage and some odor. He has completed Bactrim recently for Serratia and a few methicillin sensitive staph aureus. After our staff push hard last week we were able to get venous studies as well as arterial studies. His arterial studies showed on the  right great toe pressure of 0.86. On the left ABI at 1.47 TBI of 0.87. On both sides waveforms were triphasic VENOUS STUDIES showed reflux in the femoral vein, popliteal vein great saphenous vein at the saphenofemoral junction and great saphenous vein at the proximal thigh. Also noted to  have age-indeterminate superficial vein thrombosis involving the small saphenous vein. Notable that the patient is already on Xarelto for atrial fibrillation. 4/9; the patient has been to see vascular surgery and reviewed by Dr. [name]. He was felt to have only minimal superficial venous reflux in the right great greater saphenous vein. For this reason he was not felt to be a candidate for laser ablation of the right greater saphenous vein. A wedge cushion was suggested to keep his leg elevated. He does have deep venous reflux on the right. Culture I did last time showed a combination of Serratia Pseudomonas and methicillin sensitive staph aureus. We have been using Hydrofera Blue to the large wound, Santyl to some of the deep punched out satellite lesions distal to it. There is some improvement [id number]; the application for Apligraf was put out for further review for material that we have resubmitted. He has the deep area on the right posterior calf which is large and then 3 small punched out areas beneath this. In general his wounds look a lot better 4/23; the patient has his large original wound and then 2 punched out satellite lesions below it on the right posterior calf. I was usually able to use Apligraf #1 to cover the full surface area of the larger wound. We continued with Santyl and Hydrofera Blue on the 2 punched-out areas 5/7; patient has his large original wound and 2 punched out satellite lesions below it in the right posterior calf. Apligraf #2 today. Major improvement in the big wound in terms of wound depth. Punched-out [place] wounds have a very healthy looking wound bed. 5/21-Patient comes back for his large right posterior calf wound for which she has been an Apligraf #3 today. Wound depth seems to be better, the punched-out wounds appear to have healthy bed with some bleeding 6/4; right posterior calf wound is much better. Apligraf #4 today. The major wound has no wound depth at  all. Only 1 of the satellite lesions is still open. The patient has very high blood pressure coming in today with a diastolic blood pressure of 130 after lying there for a few minutes it was down to 110. He states that is running [id number] diastolic at home. He was high last week as well I thought he was on 50 mg 1/2 tablet twice daily of losartan I told him to double up on that however it turns out he is actually on 50 twice daily. Course he is run out of his medications prematurely. He has a follow-up with his primary's office next Wednesday. 6/18; patient's wounds continue to improve. The small area laterally is just about closed and there is continued epithelialization on the area on the posterior calf. 7/2; we applied his last Apligraf last visit. Early the next week there was a lot of purulent looking drainage that I cultured this grew staph and Pseudomonas however when we had him back for the next visit to 3 days later everything looked a lot better. He did not receive systemic antibiotics. Fortunately we did not have to remove the Apligraf. He has had heart trouble this week he was having an ablation apparently they have discovered a clot in his left atrium  they have changed all his anticoagulants as well as his blood pressure medication. 7/16; using Hydrofera Blue. We are making nice progress towards closure. He has not yet ordered his compression stockings he has his measurements 7/23; dimensions are better. He has a new satellite area superiorly. Both wounds cauterized with silver nitrate. 7/30; absolutely no change in the major wound on the posterior part of the right calf. We have been using Hydrofera Blue for a prolonged period of time and really had a nice improvement but over the last few weeks this is stalled. There is no depth. He arrived in clinic today with a new area on the right anterior tibia which apparently was an "ingrown hair". It is clearly an open area. This looks  like a wrap injury although he was not really aware of it. 8/31; since the patient was last in clinic he was admitted to hospital from 8/17 through 8/22. He had had multiple falls at home including one off a ladder. He was admitted to hospital with mild COVID-19 viral pneumonia. Noted to be hyperkalemic and in acute renal failure. He has chronic systolic heart failure with ejection fraction of 35%. He arrives back in clinic with the original wound on the posterior aspect of the right lower calf looking a lot better. He has eschared areas from falls and lacerations on his anterior knees bilaterally just below the patella and on the mid part of his tibia bilaterally. He seems to have a small area on the right lateral ankle as well 9/10- Patient comes to clinic with a 2 new wounds one on the left anterior shin and one on the right posterior leg both the left knee and right knee areas are healed up. We have been using Santyl to the left anterior leg and silver alginate to right posterior leg wounds. 9/17; the patient is original wound looks good on the posterior right calf. He has traumatic areas on the left anterior shin left anterior patellar tendon and the right mid tibia area. Except for the right posterior calf all required debridement. We have been using Santyl on these areas and silver alginate posteriorly right calf 9/24; the original wound on the posterior calf on the right looks good. This is healthy. There are traumatic wounds in the left anterior shin, left anterior patella in the right mid tibia area are about the same. He has dusky erythema on the left anterior tibia which led me to give him antibiotics last week. This does not look too much different. This is nontender and I wonder if this is all just venous inflammation. 10/2; the patient has 2 open areas on the right posterior calf both of these look good we have been using silver alginate. He has traumatic wounds on the left and right  mid tibia areas. The surface of these wounds is still not ready for closure. We have been using Iodoflex. Finally has areas on the left knee. The latter wounds are all traumatic after a fall 10/9. His original wounds of the right posterior calf are superficial and progressing towards closure. His traumatic wounds on the left anterior tibia and right anterior tibia are considerably improved in terms of the wound surface. He asked me about a skin graft in these areas I do not think this is going to be necessary. Change from Iodosorb to [facility]. ooThe area on the left knee is just about closed as well 10/16; his original wound on the right posterior calf very superficial looks healthy.  He has more recent traumatic wounds on the left anterior tibia and right anterior tibia both of these have better looking surfaces and measuring smaller. Might be beneficial for an advanced treatment product. The area on his left knee has a small superficial scab and we are going to close that when 10/23; his original wound areas on the right posterior calf continue to improve. The more recently traumatic wounds on the left anterior tibia and right anterior tibia both look better in terms of surfaces. No debridement required. We have been using Hydrofera Blue. He is approved for Apligraf 11/3; Apligraf #1 applied to the left anterior tibia right anterior tibia and to 2 small areas remaining on the right posterior calf which were part of his initial wound area. Objective Constitutional Sitting or standing Blood Pressure is within target range for patient.. Pulse regular and within target range for patient.[name] Respirations regular, non-labored and within target range.. Temperature is normal and within the target range for the patient.[name] Appears in no distress. Vitals Time Taken: 9:55 AM, Height: 73 in, Weight: 290 lbs, BMI: 38.3, Temperature: 97.7 F, Pulse: 61 bpm, Respiratory Rate: 18 breaths/min, Blood  Pressure: 134/81 mmHg. General Notes: Wound exam; left anterior and right anterior shin both looked healthy. No debridement required Apligraf #1 applied. 2 small areas still remain on the right posterior calf Apligraf applied as well here. Edema control was good peripheral pulses palpable Integumentary (Hair, Skin) Wound #6 status is Open. Original cause of wound was Gradually Appeared. The wound is located on the Right,Proximal,Posterior Lower Leg. The wound measures 0.7cm length x 0.6cm width x 0.1cm depth; 0.33cm^2 area and 0.033cm^3 volume. There is Fat Layer (Subcutaneous Tissue) Exposed exposed. There is no tunneling or undermining noted. There is a medium amount of serosanguineous drainage noted. The wound margin is flat and intact. There is large (67-100%) red granulation within the wound bed. There is no necrotic tissue within the wound bed. Wound #7 status is Open. Original cause of wound was Trauma. The wound is located on the Right,Anterior Lower Leg. The wound measures 0.7cm length x 0.5cm width x 0.1cm depth; 0.275cm^2 area and 0.027cm^3 volume. There is Fat Layer (Subcutaneous Tissue) Exposed exposed. There is no tunneling or undermining noted. There is a medium amount of serosanguineous drainage noted. The wound margin is well defined and not attached to the wound base. There is large (67-100%) red, pink granulation within the wound bed. There is no necrotic tissue within the wound bed. Wound #8 status is Open. Original cause of wound was Gradually Appeared. The wound is located on the Left,Anterior Lower Leg. The wound measures 6cm length x 2.1cm width x 0.1cm depth; 9.896cm^2 area and 0.99cm^3 volume. There is Fat Layer (Subcutaneous Tissue) Exposed exposed. There is no tunneling or undermining noted. There is a medium amount of serosanguineous drainage noted. The wound margin is distinct with the outline attached to the wound base. There is large (67-100%) red granulation  within the wound bed. There is a small (1-33%) amount of necrotic tissue within the wound bed including Adherent Slough. Assessment Active Problems ICD-10 Non-pressure chronic ulcer of right calf with fat layer exposed Type 2 diabetes mellitus with other skin ulcer Chronic venous hypertension (idiopathic) with inflammation of right lower extremity Laceration without foreign body, right lower leg, subsequent encounter Laceration without foreign body, left lower leg, subsequent encounter Cellulitis of right lower limb Procedures Wound #6 Pre-procedure diagnosis of Wound #6 is a Venous Leg Ulcer located on the Right,Proximal,Posterior Lower  Leg. A skin graft procedure using a bioengineered skin substitute/cellular or tissue based product was performed by Ricard Dillon., MD with the following instrument(s): Blade and Forceps. Apligraf was applied and secured with Steri-Strips. 10 sq cm of product was utilized and 0 sq cm was wasted. Post Application, adaptic was applied. A Time Out was conducted at 10:48, prior to the start of the procedure. The procedure was tolerated well with a pain level of 0 throughout and a pain level of 0 following the procedure. Post procedure Diagnosis Wound #6: Same as Pre-Procedure . Pre-procedure diagnosis of Wound #6 is a Venous Leg Ulcer located on the Right,Proximal,Posterior Lower Leg . There was a Four Layer Compression Therapy Procedure by Carlene Coria, RN. Post procedure Diagnosis Wound #6: Same as Pre-Procedure Wound #7 Pre-procedure diagnosis of Wound #7 is a Venous Leg Ulcer located on the Right,Anterior Lower Leg. A skin graft procedure using a bioengineered skin substitute/cellular or tissue based product was performed by Ricard Dillon., MD with the following instrument(s): Blade and Forceps. Apligraf was applied and secured with Steri-Strips. 9 sq cm of product was utilized and 0 sq cm was wasted. Post Application, adaptic was applied. A Time  Out was conducted at 10:48, prior to the start of the procedure. The procedure was tolerated well with a pain level of 0 throughout and a pain level of 0 following the procedure. Post procedure Diagnosis Wound #7: Same as Pre-Procedure . Pre-procedure diagnosis of Wound #7 is a Venous Leg Ulcer located on the Right,Anterior Lower Leg . There was a Four Layer Compression Therapy Procedure by Carlene Coria, RN. Post procedure Diagnosis Wound #7: Same as Pre-Procedure Wound #8 Pre-procedure diagnosis of Wound #8 is a Venous Leg Ulcer located on the Left,Anterior Lower Leg. A skin graft procedure using a bioengineered skin substitute/cellular or tissue based product was performed by Ricard Dillon., MD with the following instrument(s): Blade and Forceps. Apligraf was applied and secured with Steri-Strips. 25 sq cm of product was utilized and 0 sq cm was wasted. Post Application, adaptic was applied. A Time Out was conducted at 10:48, prior to the start of the procedure. The procedure was tolerated well with a pain level of 0 throughout and a pain level of 0 following the procedure. Post procedure Diagnosis Wound #8: Same as Pre-Procedure . Pre-procedure diagnosis of Wound #8 is a Venous Leg Ulcer located on the Left,Anterior Lower Leg . There was a Four Layer Compression Therapy Procedure by Carlene Coria, RN. Post procedure Diagnosis Wound #8: Same as Pre-Procedure Plan Follow-up Appointments: Return Appointment in 1 week. - Thursday AM Dressing Change Frequency: Do not change entire dressing for one week. - both lower legs. Skin Barriers/Peri-Wound Care: Moisturizing lotion - both legs Wound Cleansing: May shower with protection. Primary Wound Dressing: Wound #6 Right,Proximal,Posterior Lower Leg: Hydrofera Blue Wound #7 Right,Anterior Lower Leg: Hydrofera Blue Wound #8 Left,Anterior Lower Leg: Hydrofera Blue Secondary Dressing: Wound #6 Right,Proximal,Posterior Lower Leg: ABD  pad Kerramax - or zetvuit Wound #7 Right,Anterior Lower Leg: ABD pad Kerramax - or zetvuit Wound #8 Left,Anterior Lower Leg: ABD pad Kerramax - or zetvuit Edema Control: 4 layer compression - Bilateral Avoid standing for long periods of time Elevate legs to the level of the heart or above for 30 minutes daily and/or when sitting, a frequency of: - 3-4 times a day. 1. Apligraf #1 applied in the standard fashion to all wound areas. He will be back for an external dressing check next week I  will see this again in 2 weeks Electronic Signature(s) Signed: 04/19/2019 11:35:12 AM By: Linton Ham MD Entered By: Linton Ham on 04/19/2019 11:35:11 -------------------------------------------------------------------------------- SuperBill Details Patient Name: Date of Service: [name] 04/19/2019 Medical Record [phone] Patient Account Number: [id number] Date of Birth/Sex: Treating RN: January 26, 1956 (63 y.o. [name]) Carlene Coria Primary Care Provider: [name] Other Clinician: Referring Provider: Treating Provider/Extender:Robson, [name], [name] in Treatment: 37 Diagnosis Coding ICD-10 Codes Code Description [phone] Non-pressure chronic ulcer of right calf with fat layer exposed E11.622 Type 2 diabetes mellitus with other skin ulcer I87.321 Chronic venous hypertension (idiopathic) with inflammation of right lower extremity S81.811D Laceration without foreign body, right lower leg, subsequent encounter S81.812D Laceration without foreign body, left lower leg, subsequent encounter L03.115 Cellulitis of right lower limb Facility Procedures CPT4 Code Description: [id number] (Facility Use Only) Apligraf 1 SQ CM Modifier: Quantity: 9 CPT4 Code Description: [id number] - SKIN SUB GRAFT TRNK/ARM/LEG ICD-10 Diagnosis Description L97.212 Non-pressure chronic ulcer of right calf with fat layer S81.811D Laceration without foreign body, right lower leg,  subseq S81.812D Laceration without  foreign body, left lower leg, subsequ Modifier: exposed uent encounter ent encounter Quantity: 1 Physician Procedures CPT4 Code Description: [id number] - WC PHYS SKIN SUB GRAFT TRNK/ARM/LEG ICD-10 Diagnosis Description L97.212 Non-pressure chronic ulcer of right calf with fat layer S81.811D Laceration without foreign body, right lower leg, subse S81.812D Laceration  without foreign body, left lower leg, subseq Modifier: exposed quent encounte uent encounter Quantity: 1 r Electronic Signature(s) Signed: 04/19/2019 6:03:12 PM By: Linton Ham MD Entered By: Linton Ham on 04/19/2019 11:35:46

## 2019-05-25 NOTE — Progress Notes (Signed)
[name] ([id number]) Visit Report for 04/19/2019 Arrival Information Details Patient Name: Date of Service: [name], [name] 04/19/2019 10:00 AM Medical Record [id number] Patient Account Number: [id number] Date of Birth/Sex: Treating RN: 1956-02-23 (63 y.o. [name]) Carlene Coria Primary Care Yovani Cogburn: [name] Other Clinician: Referring Oakes Mccready: Treating Wilkie Zenon/Extender:Robson, [name], [name] in Treatment: 42 Visit Information History Since Last Visit Added or deleted any medications: No Patient Arrived: Ambulatory Any new allergies or adverse reactions: No Arrival Time: 09:53 Had a fall or experienced change in No Accompanied By: self activities of daily living that may affect Transfer Assistance: None risk of falls: Patient Identification Verified: Yes Signs or symptoms of abuse/neglect since last No Secondary Verification Process Yes visito Completed: Hospitalized since last visit: No Patient Requires Transmission-Based No Implantable device outside of the clinic excluding No Precautions: cellular tissue based products placed in the center Patient Has Alerts: Yes since last visit: Patient Alerts: Patient on Blood Has Dressing in Place as Prescribed: Yes Thinner Pain Present Now: Yes xarelto R TBI = .86, L TBI = .87 Electronic Signature(s) Signed: 05/24/2019 3:00:22 PM By: Sandre Kitty Entered By: Sandre Kitty on 04/19/2019 09:55:29 -------------------------------------------------------------------------------- Compression Therapy Details Patient Name: Date of Service: [name] 04/19/2019 10:00 AM Medical Record [id number] Patient Account Number: [id number] Date of Birth/Sex: Treating RN: 05-Oct-1955 (63 y.o. [name]) Carlene Coria Primary Care Thamar Holik: [name] Other Clinician: Referring Makyia Erxleben: Treating Kiyah Demartini/Extender:Robson, [name], [name] in Treatment:  37 Compression Therapy Performed for Wound Wound #6 Right,Proximal,Posterior Lower Leg Assessment: Performed By: Clinician Carlene Coria, RN Compression Type: Four Layer Post Procedure Diagnosis Same as Pre-procedure Electronic Signature(s) Signed: 05/25/2019 12:05:18 PM By: Carlene Coria RN Entered By: Carlene Coria on 04/19/2019 11:30:11 -------------------------------------------------------------------------------- Compression Therapy Details Patient Name: Date of Service: [name] 04/19/2019 10:00 AM Medical Record [id number] Patient Account Number: [id number] Date of Birth/Sex: Treating RN: 1955/08/17 (63 y.o. [name]) Carlene Coria Primary Care Liliya Fullenwider: [name] Other Clinician: Referring Kierra Jezewski: Treating Antaeus Karel/Extender:Robson, [name], [name] in Treatment: 37 Compression Therapy Performed for Wound Wound #7 Right,Anterior Lower Leg Assessment: Performed By: Clinician Carlene Coria, RN Compression Type: Four Layer Post Procedure Diagnosis Same as Pre-procedure Electronic Signature(s) Signed: 05/25/2019 12:05:18 PM By: Carlene Coria RN Entered By: Carlene Coria on 04/19/2019 11:30:11 -------------------------------------------------------------------------------- Compression Therapy Details Patient Name: Date of Service: [name] 04/19/2019 10:00 AM Medical Record [id number] Patient Account Number: [id number] Date of Birth/Sex: Treating RN: [birth year] (63 y.o. [name] Primary Care Linnell Swords: [name] Other Clinician: Referring Norrine Ballester: Treating Breianna Delfino/Extender:Robson, [name], [name] in Treatment: 37 Compression Therapy Performed for Wound Wound #8 Left,Anterior Lower Leg Assessment: Performed By: Clinician Carlene Coria, RN Compression Type: Four Layer Post Procedure Diagnosis Same as Pre-procedure Electronic Signature(s) Signed: 05/25/2019 12:05:18 PM By: Carlene Coria  RN Entered By: Carlene Coria on 04/19/2019 11:30:11 -------------------------------------------------------------------------------- Encounter Discharge Information Details Patient Name: Date of Service: [name]. 04/19/2019 10:00 AM Medical Record [id number] Patient Account Number: [id number] Date of Birth/Sex: Treating RN: 04-16-1956 (63 y.o. [name] Primary Care Zykiria Bruening: [name] Other Clinician: Referring Merlin Golden: Treating Samoria Fedorko/Extender:Robson, [name], [name] in Treatment: 37 Encounter Discharge Information Items Post Procedure Vitals Discharge Condition: Stable Temperature (F): 97.7 Ambulatory Status: Ambulatory Pulse (bpm): 61 Discharge Destination: Home Respiratory Rate (breaths/min): 18 Transportation: Private Auto Blood Pressure (mmHg): 134/81 Accompanied By: self Schedule Follow-up Appointment: Yes Clinical Summary of Care: Patient Declined Electronic Signature(s) Signed: 04/21/2019 5:16:49 PM By: Kela Millin Entered By: Kela Millin on 04/19/2019  11:21:56 -------------------------------------------------------------------------------- Lower Extremity Assessment Details Patient Name: Date of Service: [name] 04/19/2019 10:00 AM Medical Record [id number] Patient Account Number: [id number] Date of Birth/Sex: Treating RN: [birth year] (63 y.o. [name] Primary Care Bradrick Kamau: [name] Other Clinician: Referring Tenesha Garza: Treating Glynnis Gavel/Extender:Robson, [name], [name] in Treatment: 37 Edema Assessment Assessed: [Left: No] [Right: No] Edema: [Left: Yes] [Right: No] Calf Left: Right: Point of Measurement: cm From Medial Instep 37 cm 37.3 cm Ankle Left: Right: Point of Measurement: cm From Medial Instep 24 cm 23.5 cm Vascular Assessment Pulses: Dorsalis Pedis Palpable: [Left:Yes] [Right:Yes] Electronic Signature(s) Signed: 04/21/2019  5:16:49 PM By: Kela Millin Entered By: Kela Millin on 04/19/2019 10:33:02 -------------------------------------------------------------------------------- Multi Wound Chart Details Patient Name: Date of Service: [name]. 04/19/2019 10:00 AM Medical Record [id number] Patient Account Number: [id number] Date of Birth/Sex: Treating RN: 1955-06-17 (63 y.o. [name]) Carlene Coria Primary Care Aarianna Hoadley: [name] Other Clinician: Referring Thanvi Blincoe: Treating Rohith Fauth/Extender:Robson, [name], [name] in Treatment: 37 Vital Signs Height(in): 73 Pulse(bpm): 61 Weight(lbs): 290 Blood Pressure(mmHg): 134/81 Body Mass Index(BMI): 38 Temperature(F): 97.7 Respiratory 18 Rate(breaths/min): Photos: [6:No Photos] [7:No Photos] [8:No Photos] Wound Location: [6:Right Lower Leg - Posterior, Right Lower Leg - Anterior Left Lower Leg - Anterior Proximal] Wounding Event: [6:Gradually Appeared] [7:Trauma] [8:Gradually Appeared] Primary Etiology: [6:Venous Leg Ulcer] [7:Venous Leg Ulcer] [8:Venous Leg Ulcer] Comorbid History: [6:Asthma, Sleep Apnea, Arrhythmia, Congestive Heart Failure, Hypertension, Heart Failure, Hypertension, Heart Failure, Hypertension, Type II Diabetes] [7:Asthma, Sleep Apnea, Arrhythmia, Congestive Type II Diabetes] [8:Asthma, Sleep  Apnea, Arrhythmia, Congestive Type II Diabetes] Date Acquired: [6:01/06/2019] [7:01/13/2019] [8:02/14/2019] Weeks of Treatment: [6:14] [7:13] [8:9] Wound Status: [6:Open] [7:Open] [8:Open] Measurements L x W x D 0.7x0.6x0.1 [7:0.7x0.5x0.1] [8:6x2.1x0.1] (cm) Area (cm) : [6:0.33] [7:0.275] [8:9.896] Volume (cm) : [6:0.033] [7:0.027] [8:0.99] % Reduction in Area: [6:16.00%] [7:-40.30%] [8:-57.50%] % Reduction in Volume: 15.40% [7:-35.00%] [8:-57.60%] Classification: [6:Full Thickness Without Exposed Support Structures Exposed Support Structures Exposed Support Structures] [7:Full Thickness Without] [8:Full  Thickness Without] Exudate Amount: [6:Medium] [7:Medium] [8:Medium] Exudate Type: [6:Serosanguineous] [7:Serosanguineous] [8:Serosanguineous] Exudate Color: [6:red, brown] [7:red, brown] [8:red, brown] Wound Margin: [6:Flat and Intact] [7:Well defined, not attached] [8:Distinct, outline attached] Granulation Amount: [6:Large (67-100%)] [7:Large (67-100%)] [8:Large (67-100%)] Granulation Quality: [6:Red] [7:Red, Pink] [8:Red] Necrotic Amount: [6:None Present (0%)] [7:None Present (0%)] [8:Small (1-33%)] Exposed Structures: [6:Fat Layer (Subcutaneous Tissue) Exposed: Yes Fascia: No Tendon: No Muscle: No Joint: No Bone: No Large (67-100%)] [7:Fat Layer (Subcutaneous Tissue) Exposed: Yes Fascia: No Tendon: No Muscle: No Joint: No Bone: No Small (1-33%)] [8:Fat Layer  (Subcutaneous Tissue) Exposed: Yes Fascia: No Tendon: No Muscle: No Joint: No Bone: No Small (1-33%)] Treatment Notes Electronic Signature(s) Signed: 04/19/2019 6:03:12 PM By: Linton Ham MD Signed: 05/25/2019 12:05:18 PM By: Carlene Coria RN Entered By: Linton Ham on 04/19/2019 11:03:06 -------------------------------------------------------------------------------- Multi-Disciplinary Care Plan Details Patient Name: Date of Service: [name]. 04/19/2019 10:00 AM Medical Record [id number] Patient Account Number: [id number] Date of Birth/Sex: Treating RN: 21-Dec-1955 (63 y.o. [name] Primary Care Huriel Matt: [name] Other Clinician: Referring Malissa Slay: Treating Haynes Giannotti/Extender:Robson, [name], [name] in Treatment: 37 Active Inactive Venous Leg Ulcer Nursing Diagnoses: Actual venous Insuffiency (use after diagnosis is confirmed) Knowledge deficit related to disease process and management Goals: Non-invasive venous studies are completed as ordered Date Initiated: 09/17/2018 Date Inactivated: 09/23/2018 Target Resolution Date: 09/23/2018 Goal Status: Met Patient will  maintain optimal edema control Date Initiated: 09/17/2018 Target Resolution Date: 05/13/2019 Goal Status: Active Patient/caregiver will verbalize understanding of disease process and  disease management Date Initiated: 09/17/2018 Date Inactivated: 09/23/2018 Target Resolution Date: 10/15/2018 Goal Status: Met Interventions: Assess peripheral edema status every visit. Compression as ordered Provide education on venous insufficiency Treatment Activities: Therapeutic compression applied : 09/17/2018 Notes: Electronic Signature(s) Signed: 05/25/2019 12:05:18 PM By: Carlene Coria RN Entered By: Carlene Coria on 04/19/2019 09:48:49 -------------------------------------------------------------------------------- Pain Assessment Details Patient Name: Date of Service: SHAUL, TRAUTMAN 04/19/2019 10:00 AM Medical Record [id number] Patient Account Number: [id number] Date of Birth/Sex: Treating RN: 02/04/1956 (63 y.o. [name] Primary Care Tennelle Taflinger: [name] Other Clinician: Referring Broc Caspers: Treating Bexlee Bergdoll/Extender:Robson, [name], [name] in Treatment: 37 Active Problems Location of Pain Severity and Description of Pain Patient Has Paino Yes Site Locations Rate the pain. Current Pain Level: 4 Pain Management and Medication Current Pain Management: Electronic Signature(s) Signed: 05/24/2019 3:00:22 PM By: Sandre Kitty Signed: 05/25/2019 12:05:18 PM By: Carlene Coria RN Entered By: Sandre Kitty on 04/19/2019 09:55:53 -------------------------------------------------------------------------------- Patient/Caregiver Education Details Patient Name: Date of Service: [name] 11/3/2020andnbsp10:00 AM Medical Record [id number] Patient Account Number: [id number] Date of Birth/Gender: Treating RN: 04/08/1956 (63 y.o. Staci Acosta, Morey Hummingbird Primary Care Physician: [name] Other Clinician: Referring Physician: Treating  Physician/Extender:Robson, [name], [name] in Treatment: 77 Education Assessment Education Provided To: Patient Education Topics Provided Venous: Methods: Explain/Verbal Responses: State content correctly Electronic Signature(s) Signed: 05/25/2019 12:05:18 PM By: Carlene Coria RN Entered By: Carlene Coria on 04/19/2019 09:49:06 -------------------------------------------------------------------------------- Wound Assessment Details Patient Name: Date of Service: PETROS, AHART 04/19/2019 10:00 AM Medical Record [id number] Patient Account Number: [id number] Date of Birth/Sex: Treating RN: 1956-05-24 (63 y.o. [name]) Carlene Coria Primary Care Tyliek Timberman: [name] Other Clinician: Referring Bessie Boyte: Treating Areen Trautner/Extender:Robson, [name], [name] in Treatment: 37 Wound Status Wound Number: 6 Primary Venous Leg Ulcer Etiology: Wound Location: Right Lower Leg - Posterior, Proximal Wound Open Status: Wounding Event: Gradually Appeared Comorbid Asthma, Sleep Apnea, Arrhythmia, Congestive Date Acquired: 01/06/2019 History: Heart Failure, Hypertension, Type II Diabetes Weeks Of Treatment: 14 Clustered Wound: No Photos Wound Measurements Length: (cm) 0.7 % Reduct Width: (cm) 0.6 % Reduct Depth: (cm) 0.1 Epitheli Area: (cm) 0.33 Tunneli Volume: (cm) 0.033 Undermi Wound Description Classification: Full Thickness Without Exposed Support Foul Od Structures Slough/ Wound Flat and Intact Margin: Exudate Medium Amount: Exudate Serosanguineous Type: Exudate red, brown Color: Wound Bed Granulation Amount: Large (67-100%) Granulation Quality: Red Fascia Necrotic Amount: None Present (0%) Fat Lay Tendon Muscle Joint E Bone Ex or After Cleansing: No Fibrino No Exposed Structure Exposed: No er (Subcutaneous Tissue) Exposed: Yes Exposed: No Exposed: No xposed: No posed: No ion in Area: 16% ion in Volume:  15.4% alization: Large (67-100%) ng: No ning: No Electronic Signature(s) Signed: 04/25/2019 4:06:15 PM By: Mikeal Hawthorne EMT/HBOT Signed: 05/25/2019 12:05:18 PM By: Carlene Coria RN Previous Signature: 04/21/2019 5:16:49 PM Version By: Kela Millin Entered By: Mikeal Hawthorne on 04/25/2019 10:46:18 -------------------------------------------------------------------------------- Wound Assessment Details Patient Name: Date of Service: [name]. 04/19/2019 10:00 AM Medical Record [id number] Patient Account Number: [id number] Date of Birth/Sex: Treating RN: 19-Sep-1955 (63 y.o. [name]) Carlene Coria Primary Care Evony Rezek: [name] Other Clinician: Referring Armond Cuthrell: Treating Obelia Bonello/Extender:Robson, [name], [name] in Treatment: 37 Wound Status Wound Number: 7 Primary Venous Leg Ulcer Etiology: Wound Location: Right Lower Leg - Anterior Wound Open Wounding Event: Trauma Status: Date Acquired: 01/13/2019 Comorbid Asthma, Sleep Apnea, Arrhythmia, Congestive Weeks Of Treatment: 13 History: Heart Failure, Hypertension, Type II Diabetes Clustered Wound: No Photos Wound Measurements Length: (cm) 0.7 % Reduct Width: (cm) 0.5 %  Reduct Depth: (cm) 0.1 Epitheli Area: (cm) 0.275 Tunneli Volume: (cm) 0.027 Undermi Wound Description Classification: Full Thickness Without Exposed Support Foul Od Structures Slough/ Wound Well defined, not attached Margin: Exudate Medium Amount: Exudate Serosanguineous Type: Exudate red, brown Color: Wound Bed Granulation Amount: Large (67-100%) Granulation Quality: Red, Pink Fascia E Necrotic Amount: None Present (0%) Fat Laye Tendon E Muscle E Joint Ex Bone Exp or After Cleansing: No Fibrino No Exposed Structure xposed: No r (Subcutaneous Tissue) Exposed: Yes xposed: No xposed: No posed: No osed: No ion in Area: -40.3% ion in Volume: -35% alization: Small (1-33%) ng: No ning:  No Electronic Signature(s) Signed: 04/25/2019 4:06:15 PM By: Mikeal Hawthorne EMT/HBOT Signed: 05/25/2019 12:05:18 PM By: Carlene Coria RN Previous Signature: 04/21/2019 5:16:49 PM Version By: Kela Millin Entered By: Mikeal Hawthorne on 04/25/2019 10:45:11 -------------------------------------------------------------------------------- Wound Assessment Details Patient Name: Date of Service: [name] 04/19/2019 10:00 AM Medical Record [id number] Patient Account Number: [id number] Date of Birth/Sex: Treating RN: [birth year] (63 y.o. [name]) Carlene Coria Primary Care Laylee Schooley: [name] Other Clinician: Referring Sui Kasparek: Treating Deniss Wormley/Extender:Robson, [name], [name] in Treatment: 37 Wound Status Wound Number: 8 Primary Venous Leg Ulcer Etiology: Wound Location: Left Lower Leg - Anterior Wound Open Wounding Event: Gradually Appeared Status: Date Acquired: 02/14/2019 Comorbid Asthma, Sleep Apnea, Arrhythmia, Congestive Weeks Of Treatment: 9 History: Heart Failure, Hypertension, Type II Diabetes Clustered Wound: No Photos Wound Measurements Length: (cm) 6 % Reduct Width: (cm) 2.1 % Reduct Depth: (cm) 0.1 Epitheli Area: (cm) 9.896 Tunneli Volume: (cm) 0.99 Undermi Wound Description Classification: Full Thickness Without Exposed Support Foul Od Structures Slough/ Wound Distinct, outline attached Margin: Exudate Medium Amount: Exudate Serosanguineous Type: Exudate red, brown Color: Wound Bed Granulation Amount: Large (67-100%) Granulation Quality: Red Fascia Necrotic Amount: Small (1-33%) Fat Lay Necrotic Quality: Adherent Slough Tendon Muscle Joint E Bone Ex Electronic Signature(s) Signed: 04/25/2019 4:06:15 PM By: Mikeal Hawthorne EMT/HBOT Signed: 05/25/2019 12:05:18 PM By: Carlene Coria RN Previous Signature: 04/21/2019 5:16:49 PM Version By: Quinn Axe Entered By: Mikeal Hawthorne on 11/09 or After Cleansing: No Fibrino  Yes Exposed Structure Exposed: No er (Subcutaneous Tissue) Exposed: Yes Exposed: No Exposed: No xposed: No posed: No annon /2020 10:46:39 ion in Area: -57.5% ion in Volume: -57.6% alization: Small (1-33%) ng: No ning: No -------------------------------------------------------------------------------- Vitals Details Patient Name: Date of Service: FARAZ, PONCIANO 04/19/2019 10:00 AM Medical Record [id number] Patient Account Number: [id number] Date of Birth/Sex: Treating RN: 1955/07/25 (63 y.o. [name]) Carlene Coria Primary Care Jalexia Lalli: [name] Other Clinician: Referring Elyjah Hazan: Treating Ranesha Val/Extender:Robson, [name], [name] in Treatment: 37 Vital Signs Time Taken: 09:55 Temperature (F): 97.7 Height (in): 73 Pulse (bpm): 61 Weight (lbs): 290 Respiratory Rate (breaths/min): 18 Body Mass Index (BMI): 38.3 Blood Pressure (mmHg): 134/81 Reference Range: 80 - 120 mg / dl Electronic Signature(s) Signed: 05/24/2019 3:00:22 PM By: Sandre Kitty Entered By: Sandre Kitty on 04/19/2019 09:55:43

## 2019-05-26 ENCOUNTER — Encounter (HOSPITAL_BASED_OUTPATIENT_CLINIC_OR_DEPARTMENT_OTHER): Payer: BLUE CROSS/BLUE SHIELD | Admitting: Internal Medicine

## 2019-05-26 ENCOUNTER — Other Ambulatory Visit: Payer: Self-pay

## 2019-05-26 DIAGNOSIS — J45909 Unspecified asthma, uncomplicated: Secondary | ICD-10-CM | POA: Diagnosis not present

## 2019-05-26 DIAGNOSIS — I87311 Chronic venous hypertension (idiopathic) with ulcer of right lower extremity: Secondary | ICD-10-CM | POA: Diagnosis not present

## 2019-05-26 DIAGNOSIS — M5412 Radiculopathy, cervical region: Secondary | ICD-10-CM | POA: Diagnosis not present

## 2019-05-26 DIAGNOSIS — I429 Cardiomyopathy, unspecified: Secondary | ICD-10-CM | POA: Diagnosis not present

## 2019-05-26 DIAGNOSIS — E11622 Type 2 diabetes mellitus with other skin ulcer: Secondary | ICD-10-CM | POA: Diagnosis not present

## 2019-05-26 DIAGNOSIS — Z7901 Long term (current) use of anticoagulants: Secondary | ICD-10-CM | POA: Diagnosis not present

## 2019-05-26 DIAGNOSIS — L97822 Non-pressure chronic ulcer of other part of left lower leg with fat layer exposed: Secondary | ICD-10-CM | POA: Diagnosis not present

## 2019-05-26 DIAGNOSIS — E1151 Type 2 diabetes mellitus with diabetic peripheral angiopathy without gangrene: Secondary | ICD-10-CM | POA: Diagnosis not present

## 2019-05-26 DIAGNOSIS — I110 Hypertensive heart disease with heart failure: Secondary | ICD-10-CM | POA: Diagnosis not present

## 2019-05-26 DIAGNOSIS — E875 Hyperkalemia: Secondary | ICD-10-CM | POA: Diagnosis not present

## 2019-05-26 DIAGNOSIS — L03115 Cellulitis of right lower limb: Secondary | ICD-10-CM | POA: Diagnosis not present

## 2019-05-26 DIAGNOSIS — Z8619 Personal history of other infectious and parasitic diseases: Secondary | ICD-10-CM | POA: Diagnosis not present

## 2019-05-26 DIAGNOSIS — I4891 Unspecified atrial fibrillation: Secondary | ICD-10-CM | POA: Diagnosis not present

## 2019-05-26 DIAGNOSIS — I5022 Chronic systolic (congestive) heart failure: Secondary | ICD-10-CM | POA: Diagnosis not present

## 2019-05-26 DIAGNOSIS — E11621 Type 2 diabetes mellitus with foot ulcer: Secondary | ICD-10-CM | POA: Diagnosis not present

## 2019-05-26 DIAGNOSIS — L97212 Non-pressure chronic ulcer of right calf with fat layer exposed: Secondary | ICD-10-CM | POA: Diagnosis not present

## 2019-05-26 DIAGNOSIS — G4730 Sleep apnea, unspecified: Secondary | ICD-10-CM | POA: Diagnosis not present

## 2019-05-26 NOTE — Progress Notes (Addendum)
GRAYSEN, DOCHERTY ([name]) Visit Report for 05/26/2019 HPI Details Patient Name: Date of Service: [name], [name] 05/26/2019 1:00 PM Medical Record [name] Patient Account Number: [id number] Date of Birth/Sex: Treating RN: 10/27/1955 (63 y.o. M) Primary Care Provider: Pricilla [name] Other Clinician: Referring Provider: Treating Provider/Extender:Breannah Kratt, [name], [name] in Treatment: 50 History of Present Illness HPI Description: ADMISSION 07/29/2018 [name] is a 63 year old man with either prediabetes or diabetes he is on glipizide. In late October to November 2019 he noted a scabbed area on the back of his right calf. He picked this off a few times but it would not heal. He saw his primary physician on 12/5. It was felt at that time that this may actually heal on its own with conservative management. The next visit was on 07/20/2018 noted the area was a lot worse and arranged for his treatment here. He has been topical antibiotics like Neosporin although he stopped using this when the wound looked worse. He is just been covering this with a clean Band-Aid. He is given Bactrim a week ago and he is finishing this currently. It is made some improvement in the surrounding erythema per the patient. He does not have a history of nonhealing wounds. No prior history of wounds on his legs that he had difficulty healing. No prior skin issues. 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 Past medical history; A. fib status post ablation and recent cardioversion he is on Xarelto. He has a history of systolic heart failure, cervical radiculopathy, cardiomyopathy and type 2 diabetes as discussed ABI in the right leg was noncompressible on the right 2/20; the biopsy I did on the patient last week was negative for malignancy. Culture grew Pseudomonas and methicillin sensitive staph aureus. He is on cefdinir 300 twice a day. I will have to make this a 10  day course. He put him in compression. He states that the redness pain and erythema are a lot better. I suspect the patient has chronic venous insufficiency probably with a secondary cellulitis 2/27; arrives today with copious amounts of drainage coming out of the wound irritating the skin below the wound area. He only renewed the final 3 days of cefdinir today 3/5; came in for a nurse change 3 days ago. Again a lot of drainage noted. Zinc oxide was applied unfortunately the drainage appears to a pool then he has a string of superficial open areas extending down into the Achilles area and some just below the wound. The wound itself does not look too bad. He is completed the antibiotics although apparently there was separation from the original 7 with the last 3 days. Culture grew a few MSSA and a few Pseudomonas 3/12; not too much difference over the last week. He came in for a dressing change on Monday by our nursing staff. Necrotic debris again over the wound surface. The entire area looks irritated but nontender and I do not think shows obvious evidence of infection. We have not heard anything about the reflux studies. Also notable than in this diabetic man we had noncompressible vessels and although I can feel his pulses easily in his feet I will order arterial studies as well. 3/19-Patient had experience more pain has been dressed with a silver alginate, the wounds all have necrotic debris, very friable with easy bleeding with any kind of surface debridement including with gauze and Anasept and with a #3 curette. His vascular appointments unfortunately were all canceled on account of  the virus outbreak resulting in studies only being done for emergent cases. His pulses are easily palpable in the lower extremity. He has open areas below the primary wound where he states the drainage which included purulent material also with some blood pooled and caused breakdown. 3/26; the patient was  changed to Santyl with [facility] backing last week. This was largely because the silver alginate was sticking to the wound. He is still having quite a bit of pain. He tells [place] that the reflux studies and arterial studies we had attempted to arrange through vein and vascular are not being booked until mid May. Culture that was done last week showed both Serratia moderate and a few methicillin sensitive staph aureus I started him on Bactrim DS 1 p.o. twice daily on 3/23 for 7 days. He is here for follow-up. 4/3; patient is on Santyl with Hydrofera Blue. Patient complains of pain. Our intake nurse is noted drainage and some odor. He has completed Bactrim recently for Serratia and a few methicillin sensitive staph aureus. After our staff push hard last week we were able to get venous studies as well as arterial studies. His arterial studies showed on the right great toe pressure of 0.86. On the left ABI at 1.47 TBI of 0.87. On both sides waveforms were triphasic VENOUS STUDIES showed reflux in the femoral vein, popliteal vein great saphenous vein at the saphenofemoral junction and great saphenous vein at the proximal thigh. Also noted to have age-indeterminate superficial vein thrombosis involving the small saphenous vein. Notable that the patient is already on Xarelto for atrial fibrillation. 4/9; the patient has been to see vascular surgery and reviewed by Dr. [name]. He was felt to have only minimal superficial venous reflux in the right great greater saphenous vein. For this reason he was not felt to be a candidate for laser ablation of the right greater saphenous vein. A wedge cushion was suggested to keep his leg elevated. He does have deep venous reflux on the right. Culture I did last time showed a combination of Serratia Pseudomonas and methicillin sensitive staph aureus. We have been using Hydrofera Blue to the large wound, Santyl to some of the deep punched out satellite lesions distal  to it. There is some improvement [id number]; the application for Apligraf was put out for further review for material that we have resubmitted. He has the deep area on the right posterior calf which is large and then 3 small punched out areas beneath this. In general his wounds look a lot better 4/23; the patient has his large original wound and then 2 punched out satellite lesions below it on the right posterior calf. I was usually able to use Apligraf #1 to cover the full surface area of the larger wound. We continued with Santyl and Hydrofera Blue on the 2 punched-out areas 5/7; patient has his large original wound and 2 punched out satellite lesions below it in the right posterior calf. Apligraf #2 today. Major improvement in the big wound in terms of wound depth. Punched-out [place] wounds have a very healthy looking wound bed. 5/21-Patient comes back for his large right posterior calf wound for which she has been an Apligraf #3 today. Wound depth seems to be better, the punched-out wounds appear to have healthy bed with some bleeding 6/4; right posterior calf wound is much better. Apligraf #4 today. The major wound has no wound depth at all. Only 1 of the satellite lesions is still open. The patient has very  high blood pressure coming in today with a diastolic blood pressure of 130 after lying there for a few minutes it was down to 110. He states that is running [id number] diastolic at home. He was high last week as well I thought he was on 50 mg 1/2 tablet twice daily of losartan I told him to double up on that however it turns out he is actually on 50 twice daily. Course he is run out of his medications prematurely. He has a follow-up with his primary's office next Wednesday. 6/18; patient's wounds continue to improve. The small area laterally is just about closed and there is continued epithelialization on the area on the posterior calf. 7/2; we applied his last Apligraf last visit. Early the  next week there was a lot of purulent looking drainage that I cultured this grew staph and Pseudomonas however when we had him back for the next visit to 3 days later everything looked a lot better. He did not receive systemic antibiotics. Fortunately we did not have to remove the Apligraf. He has had heart trouble this week he was having an ablation apparently they have discovered a clot in his left atrium they have changed all his anticoagulants as well as his blood pressure medication. 7/16; using Hydrofera Blue. We are making nice progress towards closure. He has not yet ordered his compression stockings he has his measurements 7/23; dimensions are better. He has a new satellite area superiorly. Both wounds cauterized with silver nitrate. 7/30; absolutely no change in the major wound on the posterior part of the right calf. We have been using Hydrofera Blue for a prolonged period of time and really had a nice improvement but over the last few weeks this is stalled. There is no depth. He arrived in clinic today with a new area on the right anterior tibia which apparently was an "ingrown hair". It is clearly an open area. This looks like a wrap injury although he was not really aware of it. 8/31; since the patient was last in clinic he was admitted to hospital from 8/17 through 8/22. He had had multiple falls at home including one off a ladder. He was admitted to hospital with mild COVID-19 viral pneumonia. Noted to be hyperkalemic and in acute renal failure. He has chronic systolic heart failure with ejection fraction of 35%. He arrives back in clinic with the original wound on the posterior aspect of the right lower calf looking a lot better. He has eschared areas from falls and lacerations on his anterior knees bilaterally just below the patella and on the mid part of his tibia bilaterally. He seems to have a small area on the right lateral ankle as well 9/10- Patient comes to clinic with a  2 new wounds one on the left anterior shin and one on the right posterior leg both the left knee and right knee areas are healed up. We have been using Santyl to the left anterior leg and silver alginate to right posterior leg wounds. 9/17; the patient is original wound looks good on the posterior right calf. He has traumatic areas on the left anterior shin left anterior patellar tendon and the right mid tibia area. Except for the right posterior calf all required debridement. We have been using Santyl on these areas and silver alginate posteriorly right calf 9/24; the original wound on the posterior calf on the right looks good. This is healthy. There are traumatic wounds in the left anterior shin, left anterior  patella in the right mid tibia area are about the same. He has dusky erythema on the left anterior tibia which led me to give him antibiotics last week. This does not look too much different. This is nontender and I wonder if this is all just venous inflammation. 10/2; the patient has 2 open areas on the right posterior calf both of these look good we have been using silver alginate. He has traumatic wounds on the left and right mid tibia areas. The surface of these wounds is still not ready for closure. We have been using Iodoflex. Finally has areas on the left knee. The latter wounds are all traumatic after a fall 10/9. His original wounds of the right posterior calf are superficial and progressing towards closure. His traumatic wounds on the left anterior tibia and right anterior tibia are considerably improved in terms of the wound surface. He asked me about a skin graft in these areas I do not think this is going to be necessary. Change from Iodosorb to [facility]. The area on the left knee is just about closed as well 10/16; his original wound on the right posterior calf very superficial looks healthy. He has more recent traumatic wounds on the left anterior tibia and right  anterior tibia both of these have better looking surfaces and measuring smaller. Might be beneficial for an advanced treatment product. The area on his left knee has a small superficial scab and we are going to close that when 10/23; his original wound areas on the right posterior calf continue to improve. The more recently traumatic wounds on the left anterior tibia and right anterior tibia both look better in terms of surfaces. No debridement required. We have been using Hydrofera Blue. He is approved for Apligraf 11/3; Apligraf #1 applied to the left anterior tibia right anterior tibia and to 2 small areas remaining on the right posterior calf which were part of his initial wound area. 11/19; Apligraf #2. Left anterior tibia is healed. Right anterior tibia much better. Only one small area remains on the right posterior calf which was part of his initial wound area 12/3; the left anterior tibia has opened up again. Hyper granulated nodule. Right anterior tibia is superficial and larger. There is no depth of this. I did not place Apligraf today return to [facility] under compression 12/10; the patient has 2 remaining wounds 1 on the right posterior and the other on the left anterior. Both of these look quite good. No debridement is required we have been using Hydrofera Blue under compression. Electronic Signature(s) Signed: 05/26/2019 4:44:35 PM By: Linton Ham MD Entered By: Linton Ham on 05/26/2019 14:15:07 -------------------------------------------------------------------------------- Physical Exam Details Patient Name: Date of Service: [name] 05/26/2019 1:00 PM Medical Record [id number] Patient Account Number: [id number] Date of Birth/Sex: Treating RN: 05/23/1956 (63 y.o. M) Primary Care Provider: Pricilla [name] Other Clinician: Referring Provider: Treating Provider/Extender:Remberto Lienhard, [name], [name] in Treatment:  45 Constitutional Patient is hypertensive.. Pulse regular and within target range for patient.[name] Respirations regular, non-labored and within target range.. Temperature is normal and within the target range for the patient.[name] Appears in no distress. Eyes Conjunctivae clear. No discharge.no icterus. Respiratory work of breathing is normal. Cardiovascular Pedal pulses palpable and strong bilaterally.. Edema control is in excellent shape. Integumentary (Hair, Skin) Other than chronic venous insufficiency no primary skin issues are seen. Psychiatric appears at normal baseline. Notes Wound exam; right anterior tibia remains healed. Right posterior calf very superficial and looks  healthy. The area on the left anterior also looks quite satisfactory and smaller. Electronic Signature(s) Signed: 05/26/2019 4:44:35 PM By: Linton Ham MD Entered By: Linton Ham on 05/26/2019 14:16:07 -------------------------------------------------------------------------------- Physician Orders Details Patient Name: Date of Service: [name] 05/26/2019 1:00 PM Medical Record [id number] Patient Account Number: [id number] Date of Birth/Sex: Treating RN: September 19, 1955 (63 y.o. [name], Tammi Klippel Primary Care Provider: Pricilla [name] Other Clinician: Referring Provider: Treating Provider/Extender:Andrews Tener, [name], [name] in Treatment: 89 Verbal / Phone Orders: No Diagnosis Coding ICD-10 Coding Code Description [phone] Non-pressure chronic ulcer of right calf with fat layer exposed E11.622 Type 2 diabetes mellitus with other skin ulcer I87.321 Chronic venous hypertension (idiopathic) with inflammation of right lower extremity S81.811D Laceration without foreign body, right lower leg, subsequent encounter S81.812D Laceration without foreign body, left lower leg, subsequent encounter L03.115 Cellulitis of right lower limb Follow-up Appointments Return Appointment in 1  week. Dressing Change Frequency Do not change entire dressing for one week. - both lower legs. Skin Barriers/Peri-Wound Care Moisturizing lotion - both legs Wound Cleansing May shower with protection. Primary Wound Dressing Wound #6 Right,Proximal,Posterior Lower Leg Hydrofera Blue Wound #8 Left,Anterior Lower Leg Hydrofera Blue Secondary Dressing Wound #6 Right,Proximal,Posterior Lower Leg Foam - apply foam for protection to anterior portion of lower leg. Dry Gauze Wound #8 Left,Anterior Lower Leg ABD pad Edema Control 4 layer compression - Bilateral Patient to wear own compression stockings - patient to bring in on next wound care appointment. Avoid standing for long periods of time Elevate legs to the level of the heart or above for 30 minutes daily and/or when sitting, a frequency of: - 3-4 times a day. Exercise regularly Electronic Signature(s) Signed: 05/26/2019 4:44:35 PM By: Linton Ham MD Signed: 05/26/2019 5:17:33 PM By: Deon Pilling Entered By: Deon Pilling on 05/26/2019 13:51:58 -------------------------------------------------------------------------------- Problem List Details Patient Name: Date of Service: [name] 05/26/2019 1:00 PM Medical Record [id number] Patient Account Number: [id number] Date of Birth/Sex: Treating RN: 05/04/1956 (63 y.o. [name], [name] Primary Care Provider: Pricilla [name] Other Clinician: Referring Provider: Treating Provider/Extender:Cleavon Goldman, [name], [name] in Treatment: [phone] Active Problems ICD-10 Evaluated Encounter Code Description Active Date Today Diagnosis L97.212 Non-pressure chronic ulcer of right calf with fat layer 07/29/2018 No Yes exposed E11.622 Type 2 diabetes mellitus with other skin ulcer 07/29/2018 No Yes I87.321 Chronic venous hypertension (idiopathic) with 07/29/2018 No Yes inflammation of right lower extremity S81.811D Laceration without foreign body, right lower  leg, 02/14/2019 No Yes subsequent encounter S81.812D Laceration without foreign body, left lower leg, 02/14/2019 No Yes subsequent encounter L03.115 Cellulitis of right lower limb 09/24/2018 No Yes Inactive Problems Resolved Problems Electronic Signature(s) Signed: 05/26/2019 4:44:35 PM By: Linton Ham MD Entered By: Linton Ham on 05/26/2019 14:12:38 -------------------------------------------------------------------------------- Progress Note Details Patient Name: Date of Service: [name] 05/26/2019 1:00 PM Medical Record [id number] Patient Account Number: [id number] Date of Birth/Sex: Treating RN: [birth year] (63 y.o. M) Primary Care Provider: Pricilla [name] Other Clinician: Referring Provider: Treating Provider/Extender:Hadlee Burback, [name], [name] in Treatment: 43 Subjective History of Present Illness (HPI) ADMISSION 07/29/2018 Mr. Brink is a 63 year old man with either prediabetes or diabetes he is on glipizide. In late October to November 2019 he noted a scabbed area on the back of his right calf. He picked this off a few times but it would not heal. He saw his primary physician on 12/5. It was felt at that time that this may actually heal on its own with conservative management. The next visit  was on 07/20/2018 noted the area was a lot worse and arranged for his treatment here. He has been topical antibiotics like Neosporin although he stopped using this when the wound looked worse. He is just been covering this with a clean Band-Aid. He is given Bactrim a week ago and he is finishing this currently. It is made some improvement in the surrounding erythema per the patient. He does not have a history of nonhealing wounds. No prior history of wounds on his legs that he had difficulty healing. No prior skin issues. 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 Past medical history; A. fib status post ablation and recent  cardioversion he is on Xarelto. He has a history of systolic heart failure, cervical radiculopathy, cardiomyopathy and type 2 diabetes as discussed ABI in the right leg was noncompressible on the right 2/20; the biopsy I did on the patient last week was negative for malignancy. Culture grew Pseudomonas and methicillin sensitive staph aureus. He is on cefdinir 300 twice a day. I will have to make this a 10 day course. He put him in compression. He states that the redness pain and erythema are a lot better. I suspect the patient has chronic venous insufficiency probably with a secondary cellulitis 2/27; arrives today with copious amounts of drainage coming out of the wound irritating the skin below the wound area. He only renewed the final 3 days of cefdinir today 3/5; came in for a nurse change 3 days ago. Again a lot of drainage noted. Zinc oxide was applied unfortunately the drainage appears to a pool then he has a string of superficial open areas extending down into the Achilles area and some just below the wound. The wound itself does not look too bad. He is completed the antibiotics although apparently there was separation from the original 7 with the last 3 days. Culture grew a few MSSA and a few Pseudomonas 3/12; not too much difference over the last week. He came in for a dressing change on Monday by our nursing staff. Necrotic debris again over the wound surface. The entire area looks irritated but nontender and I do not think shows obvious evidence of infection. We have not heard anything about the reflux studies. Also notable than in this diabetic man we had noncompressible vessels and although I can feel his pulses easily in his feet I will order arterial studies as well. 3/19-Patient had experience more pain has been dressed with a silver alginate, the wounds all have necrotic debris, very friable with easy bleeding with any kind of surface debridement including with gauze and Anasept  and with a #3 curette. His vascular appointments unfortunately were all canceled on account of the virus outbreak resulting in studies only being done for emergent cases. His pulses are easily palpable in the lower extremity. He has open areas below the primary wound where he states the drainage which included purulent material also with some blood pooled and caused breakdown. 3/26; the patient was changed to Santyl with [facility] backing last week. This was largely because the silver alginate was sticking to the wound. He is still having quite a bit of pain. He tells [place] that the reflux studies and arterial studies we had attempted to arrange through vein and vascular are not being booked until mid May. Culture that was done last week showed both Serratia moderate and a few methicillin sensitive staph aureus I started him on Bactrim DS 1 p.o. twice daily on  3/23 for 7 days. He is here for follow-up. 4/3; patient is on Santyl with Hydrofera Blue. Patient complains of pain. Our intake nurse is noted drainage and some odor. He has completed Bactrim recently for Serratia and a few methicillin sensitive staph aureus. After our staff push hard last week we were able to get venous studies as well as arterial studies. His arterial studies showed on the right great toe pressure of 0.86. On the left ABI at 1.47 TBI of 0.87. On both sides waveforms were triphasic VENOUS STUDIES showed reflux in the femoral vein, popliteal vein great saphenous vein at the saphenofemoral junction and great saphenous vein at the proximal thigh. Also noted to have age-indeterminate superficial vein thrombosis involving the small saphenous vein. Notable that the patient is already on Xarelto for atrial fibrillation. 4/9; the patient has been to see vascular surgery and reviewed by Dr. [name]. He was felt to have only minimal superficial venous reflux in the right great greater saphenous vein. For this reason he was not felt  to be a candidate for laser ablation of the right greater saphenous vein. A wedge cushion was suggested to keep his leg elevated. He does have deep venous reflux on the right. Culture I did last time showed a combination of Serratia Pseudomonas and methicillin sensitive staph aureus. We have been using Hydrofera Blue to the large wound, Santyl to some of the deep punched out satellite lesions distal to it. There is some improvement [id number]; the application for Apligraf was put out for further review for material that we have resubmitted. He has the deep area on the right posterior calf which is large and then 3 small punched out areas beneath this. In general his wounds look a lot better 4/23; the patient has his large original wound and then 2 punched out satellite lesions below it on the right posterior calf. I was usually able to use Apligraf #1 to cover the full surface area of the larger wound. We continued with Santyl and Hydrofera Blue on the 2 punched-out areas 5/7; patient has his large original wound and 2 punched out satellite lesions below it in the right posterior calf. Apligraf #2 today. Major improvement in the big wound in terms of wound depth. Punched-out [place] wounds have a very healthy looking wound bed. 5/21-Patient comes back for his large right posterior calf wound for which she has been an Apligraf #3 today. Wound depth seems to be better, the punched-out wounds appear to have healthy bed with some bleeding 6/4; right posterior calf wound is much better. Apligraf #4 today. The major wound has no wound depth at all. Only 1 of the satellite lesions is still open. The patient has very high blood pressure coming in today with a diastolic blood pressure of 130 after lying there for a few minutes it was down to 110. He states that is running [id number] diastolic at home. He was high last week as well I thought he was on 50 mg 1/2 tablet twice daily of losartan I told him to double up  on that however it turns out he is actually on 50 twice daily. Course he is run out of his medications prematurely. He has a follow-up with his primary's office next Wednesday. 6/18; patient's wounds continue to improve. The small area laterally is just about closed and there is continued epithelialization on the area on the posterior calf. 7/2; we applied his last Apligraf last visit. Early the next week there was a  lot of purulent looking drainage that I cultured this grew staph and Pseudomonas however when we had him back for the next visit to 3 days later everything looked a lot better. He did not receive systemic antibiotics. Fortunately we did not have to remove the Apligraf. He has had heart trouble this week he was having an ablation apparently they have discovered a clot in his left atrium they have changed all his anticoagulants as well as his blood pressure medication. 7/16; using Hydrofera Blue. We are making nice progress towards closure. He has not yet ordered his compression stockings he has his measurements 7/23; dimensions are better. He has a new satellite area superiorly. Both wounds cauterized with silver nitrate. 7/30; absolutely no change in the major wound on the posterior part of the right calf. We have been using Hydrofera Blue for a prolonged period of time and really had a nice improvement but over the last few weeks this is stalled. There is no depth. He arrived in clinic today with a new area on the right anterior tibia which apparently was an "ingrown hair". It is clearly an open area. This looks like a wrap injury although he was not really aware of it. 8/31; since the patient was last in clinic he was admitted to hospital from 8/17 through 8/22. He had had multiple falls at home including one off a ladder. He was admitted to hospital with mild COVID-19 viral pneumonia. Noted to be hyperkalemic and in acute renal failure. He has chronic systolic heart failure with  ejection fraction of 35%. He arrives back in clinic with the original wound on the posterior aspect of the right lower calf looking a lot better. He has eschared areas from falls and lacerations on his anterior knees bilaterally just below the patella and on the mid part of his tibia bilaterally. He seems to have a small area on the right lateral ankle as well 9/10- Patient comes to clinic with a 2 new wounds one on the left anterior shin and one on the right posterior leg both the left knee and right knee areas are healed up. We have been using Santyl to the left anterior leg and silver alginate to right posterior leg wounds. 9/17; the patient is original wound looks good on the posterior right calf. He has traumatic areas on the left anterior shin left anterior patellar tendon and the right mid tibia area. Except for the right posterior calf all required debridement. We have been using Santyl on these areas and silver alginate posteriorly right calf 9/24; the original wound on the posterior calf on the right looks good. This is healthy. There are traumatic wounds in the left anterior shin, left anterior patella in the right mid tibia area are about the same. He has dusky erythema on the left anterior tibia which led me to give him antibiotics last week. This does not look too much different. This is nontender and I wonder if this is all just venous inflammation. 10/2; the patient has 2 open areas on the right posterior calf both of these look good we have been using silver alginate. He has traumatic wounds on the left and right mid tibia areas. The surface of these wounds is still not ready for closure. We have been using Iodoflex. Finally has areas on the left knee. The latter wounds are all traumatic after a fall 10/9. His original wounds of the right posterior calf are superficial and progressing towards closure. His traumatic wounds on  the left anterior tibia and right anterior tibia are  considerably improved in terms of the wound surface. He asked me about a skin graft in these areas I do not think this is going to be necessary. Change from Iodosorb to [facility]. ooThe area on the left knee is just about closed as well 10/16; his original wound on the right posterior calf very superficial looks healthy. He has more recent traumatic wounds on the left anterior tibia and right anterior tibia both of these have better looking surfaces and measuring smaller. Might be beneficial for an advanced treatment product. The area on his left knee has a small superficial scab and we are going to close that when 10/23; his original wound areas on the right posterior calf continue to improve. The more recently traumatic wounds on the left anterior tibia and right anterior tibia both look better in terms of surfaces. No debridement required. We have been using Hydrofera Blue. He is approved for Apligraf 11/3; Apligraf #1 applied to the left anterior tibia right anterior tibia and to 2 small areas remaining on the right posterior calf which were part of his initial wound area. 11/19; Apligraf #2. Left anterior tibia is healed. Right anterior tibia much better. Only one small area remains on the right posterior calf which was part of his initial wound area 12/3; the left anterior tibia has opened up again. Hyper granulated nodule. Right anterior tibia is superficial and larger. There is no depth of this. I did not place Apligraf today return to [facility] under compression 12/10; the patient has 2 remaining wounds 1 on the right posterior and the other on the left anterior. Both of these look quite good. No debridement is required we have been using Hydrofera Blue under compression. Objective Constitutional Patient is hypertensive.. Pulse regular and within target range for patient.[name] Respirations regular, non-labored and within target range.. Temperature is normal and within the target  range for the patient.[name] Appears in no distress. Vitals Time Taken: 1:00 PM, Height: 73 in, Weight: 290 lbs, BMI: 38.3, Temperature: 98.2 F, Pulse: 50 bpm, Respiratory Rate: 18 breaths/min, Blood Pressure: 170/96 mmHg. Eyes Conjunctivae clear. No discharge.no icterus. Respiratory work of breathing is normal. Cardiovascular Pedal pulses palpable and strong bilaterally.. Edema control is in excellent shape. Psychiatric appears at normal baseline. General Notes: Wound exam; right anterior tibia remains healed. Right posterior calf very superficial and looks healthy. The area on the left anterior also looks quite satisfactory and smaller. Integumentary (Hair, Skin) Other than chronic venous insufficiency no primary skin issues are seen. Wound #6 status is Open. Original cause of wound was Gradually Appeared. The wound is located on the Right,Proximal,Posterior Lower Leg. The wound measures 1.2cm length x 1cm width x 0.1cm depth; 0.942cm^2 area and 0.094cm^3 volume. There is Fat Layer (Subcutaneous Tissue) Exposed exposed. There is no tunneling or undermining noted. There is a medium amount of serosanguineous drainage noted. The wound margin is flat and intact. There is large (67-100%) pink granulation within the wound bed. There is no necrotic tissue within the wound bed. Wound #8 status is Open. Original cause of wound was Gradually Appeared. The wound is located on the Left,Anterior Lower Leg. The wound measures 0.6cm length x 0.9cm width x 0.1cm depth; 0.424cm^2 area and 0.042cm^3 volume. There is Fat Layer (Subcutaneous Tissue) Exposed exposed. There is no tunneling or undermining noted. There is a small amount of serosanguineous drainage noted. The wound margin is flat and intact. There is large (67-100%)  red granulation within the wound bed. There is no necrotic tissue within the wound bed. Assessment Active Problems ICD-10 Non-pressure chronic ulcer of right calf with fat layer  exposed Type 2 diabetes mellitus with other skin ulcer Chronic venous hypertension (idiopathic) with inflammation of right lower extremity Laceration without foreign body, right lower leg, subsequent encounter Laceration without foreign body, left lower leg, subsequent encounter Cellulitis of right lower limb Procedures Wound #6 Pre-procedure diagnosis of Wound #6 is a Venous Leg Ulcer located on the Right,Proximal,Posterior Lower Leg . There was a Four Layer Compression Therapy Procedure by Carlene Coria, RN. Post procedure Diagnosis Wound #6: Same as Pre-Procedure Wound #8 Pre-procedure diagnosis of Wound #8 is a Venous Leg Ulcer located on the Left,Anterior Lower Leg . There was a Four Layer Compression Therapy Procedure by Carlene Coria, RN. Post procedure Diagnosis Wound #8: Same as Pre-Procedure Plan Follow-up Appointments: Return Appointment in 1 week. Dressing Change Frequency: Do not change entire dressing for one week. - both lower legs. Skin Barriers/Peri-Wound Care: Moisturizing lotion - both legs Wound Cleansing: May shower with protection. Primary Wound Dressing: Wound #6 Right,Proximal,Posterior Lower Leg: Hydrofera Blue Wound #8 Left,Anterior Lower Leg: Hydrofera Blue Secondary Dressing: Wound #6 Right,Proximal,Posterior Lower Leg: Foam - apply foam for protection to anterior portion of lower leg. Dry Gauze Wound #8 Left,Anterior Lower Leg: ABD pad Edema Control: 4 layer compression - Bilateral Patient to wear own compression stockings - patient to bring in on next wound care appointment. Avoid standing for long periods of time Elevate legs to the level of the heart or above for 30 minutes daily and/or when sitting, a frequency of: - 3-4 times a day. Exercise regularly 1. Continue with Hydrofera Blue under 4-layer compression bilaterally 2. May be closed by next [facility] Electronic Signature(s) Signed: 06/02/2019 5:01:00 PM By: Linton Ham MD Previous  Signature: 05/26/2019 4:44:35 PM Version By: Linton Ham MD Previous Signature: 05/26/2019 5:17:33 PM Version By: Deon Pilling Entered By: Linton Ham on 06/02/2019 14:28:06 -------------------------------------------------------------------------------- Jane Lew Details Patient Name: Date of Service: TRIG, PARADY 05/26/2019 Medical Record [id number] Patient Account Number: [id number] Date of Birth/Sex: Treating RN: [birth year] (63 y.o. [name], Tammi Klippel Primary Care Provider: Pricilla [name] Other Clinician: Referring Provider: Treating Provider/Extender:Donnabelle Blanchard, [name], [name] in Treatment: 43 Diagnosis Coding ICD-10 Codes Code Description [phone] Non-pressure chronic ulcer of right calf with fat layer exposed E11.622 Type 2 diabetes mellitus with other skin ulcer I87.321 Chronic venous hypertension (idiopathic) with inflammation of right lower extremity S81.811D Laceration without foreign body, right lower leg, subsequent encounter S81.812D Laceration without foreign body, left lower leg, subsequent encounter L03.115 Cellulitis of right lower limb Facility Procedures CPT4: Code [id number] Description: [id number] BILATERAL: Application of multi-layer venous compression system; leg (below knee), including ankle and foot. Modifier Quantity: 1 Physician Procedures CPT4 Code Description: [id number] - WC PHYS LEVEL 3 - EST PT ICD-10 Diagnosis Description L97.212 Non-pressure chronic ulcer of right calf with fat layer E11.622 Type 2 diabetes mellitus with other skin ulcer S81.812D Laceration without foreign body,  left lower leg, subseq I87.321 Chronic venous hypertension (idiopathic) with inflammat extremity Modifier: exposed uent encounter ion of right low Quantity: 1 er Engineer, maintenance) Signed: 05/26/2019 4:44:35 PM By: Linton Ham MD Entered By: Linton Ham on 05/26/2019 14:17:01

## 2019-05-30 NOTE — Progress Notes (Signed)
[name], [name] ([id number]) Visit Report for 05/26/2019 Arrival Information Details Patient Name: Date of Service: [name], [name] 05/26/2019 1:00 PM Medical Record [id number] Patient Account Number: [id number] Date of Birth/Sex: Treating RN: [birth year] (63 y.o. [name] Primary Care [name]: [name] Other Clinician: Referring [name]: Treating [name]/Extender:[name], [name], [name] in Treatment: 87 Visit Information History Since Last Visit Added or deleted any medications: No Patient Arrived: Ambulatory Any new allergies or adverse reactions: No Arrival Time: 13:07 Had a fall or experienced change in No Accompanied By: self activities of daily living that may affect Transfer Assistance: None risk of falls: Patient Identification Verified: Yes Signs or symptoms of abuse/neglect since last No Secondary Verification Process Yes visito Completed: Hospitalized since last visit: No Patient Requires Transmission-Based No Implantable device outside of the clinic excluding No Precautions: cellular tissue based products placed in the center Patient Has Alerts: Yes since last visit: Patient Alerts: Patient on Blood Has Dressing in Place as Prescribed: Yes Thinner Has Compression in Place as Prescribed: Yes xarelto Pain Present Now: No R TBI = .86, L TBI = .87 Electronic Signature(s) Signed: 05/30/2019 5:56:42 PM By: Kela Millin Entered By: Kela Millin on 05/26/2019 13:07:44 -------------------------------------------------------------------------------- Compression Therapy Details Patient Name: Date of Service: [name] 05/26/2019 1:00 PM Medical Record [id number] Patient Account Number: [id number] Date of Birth/Sex: Treating RN: [birth year] (63 y.o. [name] Primary Care [name]: [name] Other Clinician: Referring [name]: Treating [name]/Extender:[name],  [name], [name] in Treatment: 43 Compression Therapy Performed for Wound Wound #6 Right,Proximal,Posterior Lower Leg Assessment: Performed By: [name] Church, RN Compression Type: Four Layer Post Procedure Diagnosis Same as Pre-procedure Electronic Signature(s) Signed: 05/26/2019 5:17:33 PM By: [name] Entered By: [name] on 05/26/2019 13:51:38 -------------------------------------------------------------------------------- Compression Therapy Details Patient Name: Date of Service: [name], [name] 05/26/2019 1:00 PM Medical Record [id number] Patient Account Number: [id number] Date of Birth/Sex: Treating RN: 1955-09-14 (63 y.o. [name] Primary Care Anahlia Iseminger: [name] Other Clinician: Referring Haidynn Almendarez: Treating Calhoun Reichardt/Extender:[name], [name], [name] in Treatment: 43 Compression Therapy Performed for Wound Wound #8 Left,Anterior Lower Leg Assessment: Performed By: Clinician [name] Coria, RN Compression Type: Four Layer Post Procedure Diagnosis Same as Pre-procedure Electronic Signature(s) Signed: 05/26/2019 5:17:33 PM By: [name] Entered By: [name] on 05/26/2019 14:35:06 -------------------------------------------------------------------------------- Encounter Discharge Information Details Patient Name: Date of Service: [name] 05/26/2019 1:00 PM Medical Record [id number] Patient Account Number: [id number] Date of Birth/Sex: Treating RN: 12-04-1955 (63 y.o. [name] Primary Care [name]: [name] Other Clinician: Referring Zae Kirtz: Treating [name]/Extender:[name], [name], [name] in Treatment: 43 Encounter Discharge Information Items Discharge Condition: Stable Ambulatory Status: Ambulatory Discharge Destination: Home Transportation: Private Auto Accompanied By: self Schedule Follow-up Appointment: Yes Clinical  Summary of Care: Patient Declined Electronic Signature(s) Signed: 05/26/2019 4:44:46 PM By: [name] Coria RN Entered By: [name] on 05/26/2019 14:19:41 -------------------------------------------------------------------------------- Lower Extremity Assessment Details Patient Name: Date of Service: [name], [name] 05/26/2019 1:00 PM Medical Record [id number] Patient Account Number: [id number] Date of Birth/Sex: Treating RN: 1955-12-26 (63 y.o. [name] Primary Care [name]: [name] Other Clinician: Referring [name]: Treating [name]/Extender:[name], [name], [name] in Treatment: 43 Edema Assessment Assessed: [Left: No] [Right: No] Edema: [Left: No] [Right: No] Calf Left: Right: Point of Measurement: cm From Medial Instep 35.5 cm 37 cm Ankle Left: Right: Point of Measurement: cm From Medial Instep 23 cm 22 cm Vascular Assessment Pulses: Dorsalis Pedis Palpable: [Left:Yes] [Right:Yes] Electronic Signature(s) Signed: 05/30/2019 5:56:42  PM By: Kela Millin Entered By: Kela Millin on 05/26/2019 13:08:53 -------------------------------------------------------------------------------- Multi Wound Chart Details Patient Name: Date of Service: [name] 05/26/2019 1:00 PM Medical Record [id number] Patient Account Number: [id number] Date of Birth/Sex: Treating RN: 1956-03-29 (63 y.o. M) Primary Care [name]: [name] Other Clinician: Referring [name]: Treating [name]/Extender:[name], [name], [name] in Treatment: 43 Vital Signs Height(in): 73 Pulse(bpm): 50 Weight(lbs): 290 Blood Pressure(mmHg): 170/96 Body Mass Index(BMI): 38 Temperature(F): 98.2 Respiratory 18 Rate(breaths/min): Photos: [6:No Photos] [8:No Photos] [N/A:N/A] Wound Location: [6:Right Lower Leg - Posterior, Left Lower Leg - Anterior Proximal] [N/A:N/A] Wounding Event: [6:Gradually Appeared]  [8:Gradually Appeared] [N/A:N/A] Primary Etiology: [6:Venous Leg Ulcer] [8:Venous Leg Ulcer] [N/A:N/A] Comorbid History: [6:Asthma, Sleep Apnea, Arrhythmia, Congestive Heart Failure, Hypertension, Heart Failure, Hypertension, Type II Diabetes] [8:Asthma, Sleep Apnea, Arrhythmia, Congestive Type II Diabetes] [N/A:N/A] Date Acquired: [6:01/06/2019] [8:02/14/2019] [N/A:N/A] Weeks of Treatment: [6:20] [8:14] [N/A:N/A] Wound Status: [6:Open] [8:Open] [N/A:N/A] Measurements L x W x D 1.2x1x0.1 [8:0.6x0.9x0.1] [N/A:N/A] (cm) Area (cm) : [6:0.942] [8:0.424] [N/A:N/A] Volume (cm) : [6:0.094] [8:0.042] [N/A:N/A] % Reduction in Area: [6:-139.70%] [8:93.30%] [N/A:N/A] % Reduction in Volume: -141.00% [8:93.30%] [N/A:N/A] Classification: [6:Full Thickness Without Exposed Support Structures Exposed Support Structures] [8:Full Thickness Without] [N/A:N/A] Exudate Amount: [6:Medium] [8:Small] [N/A:N/A] Exudate Type: [6:Serosanguineous] [8:Serosanguineous] [N/A:N/A] Exudate Color: [6:red, brown] [8:red, brown] [N/A:N/A] Wound Margin: [6:Flat and Intact] [8:Flat and Intact] [N/A:N/A] Granulation Amount: [6:Large (67-100%)] [8:Large (67-100%)] [N/A:N/A] Granulation Quality: [6:Pink] [8:Red] [N/A:N/A] Necrotic Amount: [6:None Present (0%)] [8:None Present (0%)] [N/A:N/A] Exposed Structures: [6:Fat Layer (Subcutaneous Fat Layer (Subcutaneous N/A Tissue) Exposed: Yes Fascia: No Tendon: No Muscle: No Joint: No Bone: No] [8:Tissue) Exposed: Yes Fascia: No Tendon: No Muscle: No Joint: No Bone: No] Epithelialization: [6:Large (67-100%)] [8:Large (67-100%) N/A] [N/A:N/A N/A] Treatment Notes Electronic Signature(s) Signed: 05/26/2019 4:44:35 PM By: Linton Ham MD Entered By: Linton Ham on 05/26/2019 14:14:33 -------------------------------------------------------------------------------- Multi-Disciplinary Care Plan Details Patient Name: Date of Service: [name]. 05/26/2019 1:00 PM Medical  Record [id number] Patient Account Number: [id number] Date of Birth/Sex: Treating RN: 1956/01/14 (63 y.o. [name] Primary Care Jasira Robinson: [name] Other Clinician: Referring Dyna Figuereo: Treating Giovannie Scerbo/Extender:[name], [name], [name] in Treatment: 43 Active Inactive Venous Leg Ulcer Nursing Diagnoses: Actual venous Insuffiency (use after diagnosis is confirmed) Knowledge deficit related to disease process and management Goals: Non-invasive venous studies are completed as ordered Date Initiated: 09/17/2018 Date Inactivated: 09/23/2018 Target Resolution Date: 09/23/2018 Goal Status: Met Patient will maintain optimal edema control Date Initiated: 09/17/2018 Target Resolution Date: 06/17/2019 Goal Status: Active Patient/caregiver will verbalize understanding of disease process and disease management Date Initiated: 09/17/2018 Date Inactivated: 09/23/2018 Target Resolution Date: 10/15/2018 Goal Status: Met Interventions: Assess peripheral edema status every visit. Compression as ordered Provide education on venous insufficiency Treatment Activities: Therapeutic compression applied : 09/17/2018 Notes: Electronic Signature(s) Signed: 05/26/2019 5:17:33 PM By: [name] Entered By: [name] on 05/26/2019 13:52:27 -------------------------------------------------------------------------------- Pain Assessment Details Patient Name: Date of Service: [name], [name] 05/26/2019 1:00 PM Medical Record [id number] Patient Account Number: [id number] Date of Birth/Sex: Treating RN: 04/26/1956 (64 y.o. [name] Primary Care Paulo Keimig: [name] Other Clinician: Referring Ameliana Brashear: Treating Laurence Folz/Extender:[name], [name], [name] in Treatment: [phone] Active Problems Location of Pain Severity and Description of Pain Patient Has Paino No Site Locations Pain Management and Medication Current Pain  Management: Electronic Signature(s) Signed: 05/30/2019 5:56:42 PM By: Kela Millin Entered By: Kela Millin on 05/26/2019 13:08:19 -------------------------------------------------------------------------------- Patient/Caregiver Education Details Patient Name: Date of Service: [name] 12/10/2020andnbsp1:00 PM Medical Record  [id number] Patient Account Number: [id number] Date of Birth/Gender: Treating RN: Sep 20, 1955 (63 y.o. [name] Primary Care Physician: [name] Other Clinician: Referring Physician: Treating Physician/Extender:[name], [name], [name] in Treatment: 40 Education Assessment Education Provided To: Patient Education Topics Provided Venous: Handouts: Managing Venous Disease and Related Ulcers Methods: Explain/Verbal Responses: Reinforcements needed Electronic Signature(s) Signed: 05/26/2019 5:17:33 PM By: [name] Entered By: [name] on 05/26/2019 13:52:39 -------------------------------------------------------------------------------- Wound Assessment Details Patient Name: Date of Service: [name], [name] 05/26/2019 1:00 PM Medical Record [id number] Patient Account Number: [id number] Date of Birth/Sex: Treating RN: 29-May-1956 (63 y.o. [name] Primary Care Dalbert Stillings: [name] Other Clinician: Referring Irva Loser: Treating Sandhya Denherder/Extender:[name], [name], [name] in Treatment: 43 Wound Status Wound Number: 6 Primary Venous Leg Ulcer Etiology: Wound Location: Right Lower Leg - Posterior, Proximal Wound Open Status: Wounding Event: Gradually Appeared Comorbid Asthma, Sleep Apnea, Arrhythmia, Congestive Date Acquired: 01/06/2019 History: Heart Failure, Hypertension, Type II Diabetes Weeks Of Treatment: 20 Clustered Wound: No Photos Wound Measurements Length: (cm) 1.2 % Reduct Width: (cm) 1 % Reduct Depth: (cm) 0.1 Epitheli Area:  (cm) 0.942 Tunneli Volume: (cm) 0.094 Undermi Wound Description Full Thickness Without Exposed Support Foul Od Classification: Structures Slough/ Wound Flat and Intact Margin: Exudate Medium Amount: Exudate Serosanguineous Type: Exudate red, brown Color: Wound Bed Granulation Amount: Large (67-100%) Granulation Quality: Pink Fascia Exp Necrotic Amount: None Present (0%) Fat Layer Tendon Exp Muscle Exp Joint Expo Bone Expos or After Cleansing: No Fibrino No Exposed Structure osed: No (Subcutaneous Tissue) Exposed: Yes osed: No osed: No sed: No ed: No ion in Area: -139.7% ion in Volume: -141% alization: Large (67-100%) ng: No ning: No Treatment Notes Wound #6 (Right, Proximal, Posterior Lower Leg) 1. Cleanse With Wound Cleanser Soap and water 3. Primary Dressing Applied Hydrofera Blue 4. Secondary Dressing Dry Gauze Foam 6. Support Layer Applied 4 layer compression wrap Notes netting Electronic Signature(s) Signed: 05/30/2019 3:44:56 PM By: Mikeal Hawthorne EMT/HBOT Signed: 05/30/2019 5:56:42 PM By: Kela Millin Entered By: Mikeal Hawthorne on 05/30/2019 09:52:18 -------------------------------------------------------------------------------- Wound Assessment Details Patient Name: Date of Service: [name] 05/26/2019 1:00 PM Medical Record [id number] Patient Account Number: [id number] Date of Birth/Sex: Treating RN: 09-03-1955 (63 y.o. [name] Primary Care Chrishonda Hesch: [name] Other Clinician: Referring Docia Klar: Treating Dossie Swor/Extender:[name], [name], [name] in Treatment: 43 Wound Status Wound Number: 8 Primary Venous Leg Ulcer Etiology: Wound Location: Left Lower Leg - Anterior Wound Open Wounding Event: Gradually Appeared Status: Date Acquired: 02/14/2019 Comorbid Asthma, Sleep Apnea, Arrhythmia, Congestive Weeks Of Treatment: 14 History: Heart Failure, Hypertension, Type II  Diabetes Clustered Wound: No Wound Measurements Length: (cm) 0.6 % Reductio Width: (cm) 0.9 % Reductio Depth: (cm) 0.1 Epithelial Area: (cm) 0.424 Tunneling Volume: (cm) 0.042 Undermi Wound Description Full Thickness Without Exposed Support Foul Od Classification: Structures Slough/ Wound Flat and Intact Margin: Exudate Small Amount: Exudate Serosanguineous Type: Exudate red, brown Color: Wound Bed Granulation Amount: Large (67-100%) Granulation Quality: Red Fascia Necrotic Amount: None Present (0%) Fat Lay Tendon Muscle Joint E Bone Ex or After Cleansing: No Fibrino No Exposed Structure Exposed: No er (Subcutaneous Tissue) Exposed: Yes Exposed: No Exposed: No xposed: No posed: No n in Area: 93.3% n in Volume: 93.3% ization: Large (67-100%) : No ning: No Treatment Notes Wound #8 (Left, Anterior Lower Leg) 1. Cleanse With Wound Cleanser Soap and water 3. Primary Dressing Applied Hydrofera Blue 4. Secondary Dressing Dry Gauze Foam 6. Support Layer Applied 4 layer compression wrap Notes netting Electronic  Signature(s) Signed: 05/30/2019 5:56:42 PM By: Kela Millin Entered By: Kela Millin on 05/26/2019 13:09:59 -------------------------------------------------------------------------------- Vitals Details Patient Name: Date of Service: [name]. 05/26/2019 1:00 PM Medical Record [id number] Patient Account Number: [id number] Date of Birth/Sex: Treating RN: 28-Sep-1955 (63 y.o. [name] Primary Care Rhema Boyett: [name] Other Clinician: Referring Dontavian Marchi: Treating Emir Nack/Extender:[name], [name], [name] in Treatment: 43 Vital Signs Time Taken: 13:00 Temperature (F): 98.2 Height (in): 73 Pulse (bpm): 50 Weight (lbs): 290 Respiratory Rate (breaths/min): 18 Body Mass Index (BMI): 38.3 Blood Pressure (mmHg): 170/96 Reference Range: 80 - 120 mg / dl Electronic  Signature(s) Signed: 05/30/2019 5:56:42 PM By: Kela Millin Entered By: Kela Millin on 05/26/2019 13:08:12

## 2019-05-31 DIAGNOSIS — G4730 Sleep apnea, unspecified: Secondary | ICD-10-CM | POA: Diagnosis not present

## 2019-05-31 DIAGNOSIS — G4733 Obstructive sleep apnea (adult) (pediatric): Secondary | ICD-10-CM | POA: Diagnosis not present

## 2019-06-01 ENCOUNTER — Other Ambulatory Visit (HOSPITAL_COMMUNITY): Payer: Self-pay | Admitting: Internal Medicine

## 2019-06-01 NOTE — Telephone Encounter (Signed)
Pt last saw [name], [place] on 05/18/19, last labs 03/08/19 Creat 1.50, age 63, weight 133.4kg, CrCl 86.13, based on CrCl pt is on appropriate dosage of Pradaxa 150mg  BID.  Will refill rx.

## 2019-06-02 ENCOUNTER — Other Ambulatory Visit: Payer: Self-pay

## 2019-06-02 ENCOUNTER — Encounter (HOSPITAL_BASED_OUTPATIENT_CLINIC_OR_DEPARTMENT_OTHER): Payer: BLUE CROSS/BLUE SHIELD | Admitting: Internal Medicine

## 2019-06-02 DIAGNOSIS — E11622 Type 2 diabetes mellitus with other skin ulcer: Secondary | ICD-10-CM | POA: Diagnosis not present

## 2019-06-02 DIAGNOSIS — J45909 Unspecified asthma, uncomplicated: Secondary | ICD-10-CM | POA: Diagnosis not present

## 2019-06-02 DIAGNOSIS — I87313 Chronic venous hypertension (idiopathic) with ulcer of bilateral lower extremity: Secondary | ICD-10-CM | POA: Diagnosis not present

## 2019-06-02 DIAGNOSIS — L97212 Non-pressure chronic ulcer of right calf with fat layer exposed: Secondary | ICD-10-CM | POA: Diagnosis not present

## 2019-06-02 DIAGNOSIS — E875 Hyperkalemia: Secondary | ICD-10-CM | POA: Diagnosis not present

## 2019-06-02 DIAGNOSIS — L97822 Non-pressure chronic ulcer of other part of left lower leg with fat layer exposed: Secondary | ICD-10-CM | POA: Diagnosis not present

## 2019-06-02 DIAGNOSIS — L03115 Cellulitis of right lower limb: Secondary | ICD-10-CM | POA: Diagnosis not present

## 2019-06-02 DIAGNOSIS — Z7901 Long term (current) use of anticoagulants: Secondary | ICD-10-CM | POA: Diagnosis not present

## 2019-06-02 DIAGNOSIS — E1151 Type 2 diabetes mellitus with diabetic peripheral angiopathy without gangrene: Secondary | ICD-10-CM | POA: Diagnosis not present

## 2019-06-02 DIAGNOSIS — I4891 Unspecified atrial fibrillation: Secondary | ICD-10-CM | POA: Diagnosis not present

## 2019-06-02 DIAGNOSIS — M5412 Radiculopathy, cervical region: Secondary | ICD-10-CM | POA: Diagnosis not present

## 2019-06-02 DIAGNOSIS — Z8619 Personal history of other infectious and parasitic diseases: Secondary | ICD-10-CM | POA: Diagnosis not present

## 2019-06-02 DIAGNOSIS — I110 Hypertensive heart disease with heart failure: Secondary | ICD-10-CM | POA: Diagnosis not present

## 2019-06-02 DIAGNOSIS — E11621 Type 2 diabetes mellitus with foot ulcer: Secondary | ICD-10-CM | POA: Diagnosis not present

## 2019-06-02 DIAGNOSIS — I429 Cardiomyopathy, unspecified: Secondary | ICD-10-CM | POA: Diagnosis not present

## 2019-06-02 DIAGNOSIS — I5022 Chronic systolic (congestive) heart failure: Secondary | ICD-10-CM | POA: Diagnosis not present

## 2019-06-02 DIAGNOSIS — G4730 Sleep apnea, unspecified: Secondary | ICD-10-CM | POA: Diagnosis not present

## 2019-06-02 NOTE — Progress Notes (Addendum)
JAEVON, PARAS ([id number]) Visit Report for 06/02/2019 Arrival Information Details Patient Name: Date of Service: [name], [name] 06/02/2019 2:00 PM Medical Record [id number] Patient Account Number: [id number] Date of Birth/Sex: Treating RN: July 06, 1955 (63 y.o. [name], [name] Primary Care [name]: [name] Other Clinician: Referring [name]: Treating [name]/Extender:[name], [name], [name] in Treatment: 61 Visit Information History Since Last Visit Added or deleted any medications: No Patient Arrived: Ambulatory Any new allergies or adverse reactions: No Arrival Time: 13:49 Had a fall or experienced change in No Accompanied By: alone activities of daily living that may affect Transfer Assistance: None risk of falls: Patient Identification Verified: Yes Signs or symptoms of abuse/neglect since last No Secondary Verification Process Yes visito Completed: Hospitalized since last visit: No Patient Requires Transmission-Based No Implantable device outside of the clinic excluding No Precautions: cellular tissue based products placed in the center Patient Has Alerts: Yes since last visit: Patient Alerts: Patient on Blood Has Dressing in Place as Prescribed: Yes Thinner Has Compression in Place as Prescribed: Yes xarelto Pain Present Now: No R TBI = .86, L TBI = .87 Electronic Signature(s) Signed: 06/02/2019 5:32:06 PM By: [name] Hurst RN, BSN Entered By: [name] on 06/02/2019 13:49:56 -------------------------------------------------------------------------------- Compression Therapy Details Patient Name: Date of Service: [name] 06/02/2019 2:00 PM Medical Record [id number] Patient Account Number: [id number] Date of Birth/Sex: Treating RN: [birth year] (63 y.o. [name] Primary Care Donika Butner: [name] Other Clinician: Referring [name]: Treating [name]/Extender:[name],  [name], [name] in Treatment: 44 Compression Therapy Performed for Wound Wound #6 Right,Proximal,Posterior Lower Leg Assessment: Performed By: [name] Church, RN Compression Type: Three Layer Post Procedure Diagnosis Same as Pre-procedure Electronic Signature(s) Signed: 06/02/2019 5:34:18 PM By: [name] Entered By: [name] on 06/02/2019 14:25:53 -------------------------------------------------------------------------------- Compression Therapy Details Patient Name: Date of Service: [name], [name] 06/02/2019 2:00 PM Medical Record [id number] Patient Account Number: [id number] Date of Birth/Sex: Treating RN: 08-Dec-1955 (63 y.o. [name] Primary Care [name]: [name] Other Clinician: Referring [name]: Treating [name]/Extender:[name], [name], [name] in Treatment: 44 Compression Therapy Performed for Wound Wound #8 Left,Anterior Lower Leg Assessment: Performed By: [name] Church, RN Compression Type: Three Layer Post Procedure Diagnosis Same as Pre-procedure Electronic Signature(s) Signed: 06/02/2019 5:34:18 PM By: [name] Entered By: [name] on 06/02/2019 14:25:53 -------------------------------------------------------------------------------- Encounter Discharge Information Details Patient Name: Date of Service: [name] 06/02/2019 2:00 PM Medical Record [id number] Patient Account Number: [id number] Date of Birth/Sex: Treating RN: 21-Nov-1955 (63 y.o. [name]) [name] Primary Care Tiersa [name]: [name] Other Clinician: Referring [name]: Treating [name]/Extender:[name], [name], [name] in Treatment: 44 Encounter Discharge Information Items Discharge Condition: Stable Ambulatory Status: Ambulatory Discharge Destination: Home Transportation: Private Auto Accompanied By: self Schedule Follow-up Appointment: Yes Clinical  Summary of Care: Patient Declined Electronic Signature(s) Signed: 06/02/2019 4:51:50 PM By: [name] Coria RN Entered By: [name] on 06/02/2019 14:54:22 -------------------------------------------------------------------------------- Lower Extremity Assessment Details Patient Name: Date of Service: [name], [name] 06/02/2019 2:00 PM Medical Record [id number] Patient Account Number: [id number] Date of Birth/Sex: Treating RN: 1955-08-26 (63 y.o. [name] Primary Care [name]: [name] Other Clinician: Referring [name]: Treating [name]/Extender:[name], [name], [name] in Treatment: 44 Edema Assessment Assessed: [Left: No] [Right: No] Edema: [Left: No] [Right: No] Calf Left: Right: Point of Measurement: cm From Medial Instep 36 cm 38 cm Ankle Left: Right: Point of Measurement: cm From Medial Instep 21.8 cm 22 cm Vascular Assessment Pulses: Dorsalis Pedis Palpable: [Left:Yes] [Right:Yes] Electronic Signature(s) Signed:  06/02/2019 5:32:06 PM By: [name] Hurst RN, BSN Entered By: [name] on 06/02/2019 13:56:09 -------------------------------------------------------------------------------- Multi Wound Chart Details Patient Name: Date of Service: [name] 06/02/2019 2:00 PM Medical Record [id number] Patient Account Number: [id number] Date of Birth/Sex: Treating RN: [birth year] (63 y.o. [name], [name] Primary Care [name]: [name] Other Clinician: Referring Khang Hannum: Treating [name]/Extender:[name], [name], [name] in Treatment: 44 Vital Signs Height(in): 73 Pulse(bpm): 16 Weight(lbs): 290 Blood Pressure(mmHg): 140/81 Body Mass Index(BMI): 38 Temperature(F): 97.7 Respiratory 18 Rate(breaths/min): Photos: [6:No Photos] [8:No Photos] [N/A:N/A] Wound Location: [6:Right Lower Leg - Posterior, Left Lower Leg - Anterior Proximal] [N/A:N/A] Wounding Event: [6:Gradually  Appeared] [8:Gradually Appeared] [N/A:N/A] Primary Etiology: [6:Venous Leg Ulcer] [8:Venous Leg Ulcer] [N/A:N/A] Comorbid History: [6:Asthma, Sleep Apnea, Arrhythmia, Congestive Heart Failure, Hypertension, Heart Failure, Hypertension, Type II Diabetes] [8:Asthma, Sleep Apnea, Arrhythmia, Congestive Type II Diabetes] [N/A:N/A] Date Acquired: [6:01/06/2019] [8:02/14/2019] [N/A:N/A] Weeks of Treatment: [6:21] [8:15] [N/A:N/A] Wound Status: [6:Open] [8:Open] [N/A:N/A] Measurements L x W x D 0.7x0.5x0.1 [8:1.3x0.5x0.1] [N/A:N/A] (cm) Area (cm) : [6:0.275] [8:0.511] [N/A:N/A] Volume (cm) : [6:0.027] [8:0.051] [N/A:N/A] % Reduction in Area: [6:30.00%] [8:91.90%] [N/A:N/A] % Reduction in Volume: 30.80% [8:91.90%] [N/A:N/A] Classification: [6:Full Thickness Without Exposed Support Structures Exposed Support Structures] [8:Full Thickness Without] [N/A:N/A] Exudate Amount: [6:Small] [8:Small] [N/A:N/A] Exudate Type: [6:Serosanguineous] [8:Serosanguineous] [N/A:N/A] Exudate Color: [6:red, brown] [8:red, brown] [N/A:N/A] Wound Margin: [6:Flat and Intact] [8:Flat and Intact] [N/A:N/A] Granulation Amount: [6:Large (67-100%)] [8:Large (67-100%)] [N/A:N/A] Granulation Quality: [6:Red] [8:Red] [N/A:N/A] Necrotic Amount: [6:None Present (0%)] [8:None Present (0%)] [N/A:N/A] Exposed Structures: [6:Fat Layer (Subcutaneous Fat Layer (Subcutaneous N/A Tissue) Exposed: Yes Fascia: No Tendon: No Muscle: No Joint: No Bone: No] [8:Tissue) Exposed: Yes Fascia: No Tendon: No Muscle: No Joint: No Bone: No] Epithelialization: [6:Large (67-100%)] [8:Medium (34-66%) Compression Therapy] [N/A:N/A N/A] Treatment Notes Electronic Signature(s) Signed: 06/02/2019 5:01:00 PM By: Linton Ham MD Signed: 06/02/2019 5:34:18 PM By: [name] Entered By: Linton Ham on 06/02/2019 14:32:32 -------------------------------------------------------------------------------- Multi-Disciplinary Care Plan Details Patient  Name: Date of Service: [name]. 06/02/2019 2:00 PM Medical Record [id number] Patient Account Number: [id number] Date of Birth/Sex: Treating RN: February 18, 1956 (63 y.o. [name] Primary Care Trine Fread: [name] Other Clinician: Referring Ami Thornsberry: Treating Estefano Victory/Extender:[name], [name], [name] in Treatment: 44 Active Inactive Venous Leg Ulcer Nursing Diagnoses: Actual venous Insuffiency (use after diagnosis is confirmed) Knowledge deficit related to disease process and management Goals: Non-invasive venous studies are completed as ordered Date Initiated: 09/17/2018 Date Inactivated: 09/23/2018 Target Resolution Date: 09/23/2018 Goal Status: Met Patient will maintain optimal edema control Date Initiated: 09/17/2018 Target Resolution Date: 06/17/2019 Goal Status: Active Patient/caregiver will verbalize understanding of disease process and disease management Date Initiated: 09/17/2018 Date Inactivated: 09/23/2018 Target Resolution Date: 10/15/2018 Goal Status: Met Interventions: Assess peripheral edema status every visit. Compression as ordered Provide education on venous insufficiency Treatment Activities: Therapeutic compression applied : 09/17/2018 Notes: Electronic Signature(s) Signed: 06/02/2019 5:34:18 PM By: [name] Entered By: [name] on 06/02/2019 14:23:24 -------------------------------------------------------------------------------- Pain Assessment Details Patient Name: Date of Service: [name], [name] 06/02/2019 2:00 PM Medical Record [id number] Patient Account Number: [id number] Date of Birth/Sex: Treating RN: January 17, 1956 (63 y.o. [name] Primary Care Quinta Eimer: [name] Other Clinician: Referring Bernadetta Roell: Treating Niyonna Betsill/Extender:[name], [name], [name] in Treatment: 44 Active Problems Location of Pain Severity and Description of Pain Patient Has Paino No Site  Locations Pain Management and Medication Current Pain Management: Electronic Signature(s) Signed: 06/02/2019 5:32:06 PM By: [name] Hurst RN, BSN Entered By: [name] on 06/02/2019 13:50:03 --------------------------------------------------------------------------------  Patient/Caregiver Education Details Patient Name: Date of Service: [name], [name] 12/17/2020andnbsp2:00 PM Medical Record [id number] Patient Account Number: [id number] Date of Birth/Gender: Treating RN: 1955/07/07 (63 y.o. [name] Primary Care Physician: [name] Other Clinician: Referring Physician: Treating Physician/Extender:[name], [name], [name] in Treatment: 69 Education Assessment Education Provided To: Patient Education Topics Provided Venous: Handouts: Managing Venous Disease and Related Ulcers Methods: Explain/Verbal Responses: Reinforcements needed Electronic Signature(s) Signed: 06/02/2019 5:34:18 PM By: [name] Entered By: [name] on 06/02/2019 14:23:36 -------------------------------------------------------------------------------- Wound Assessment Details Patient Name: Date of Service: [name], [name] 06/02/2019 2:00 PM Medical Record [id number] Patient Account Number: [id number] Date of Birth/Sex: Treating RN: 10-Sep-1955 (63 y.o. [name] Primary Care Ezme Duch: [name] Other Clinician: Referring [name] Fuhrmann: Treating Vidalia Serpas/Extender:[name], [name], [name] in Treatment: 44 Wound Status Wound Number: 6 Primary Venous Leg Ulcer Etiology: Wound Location: Right Lower Leg - Posterior, Proximal Wound Open Status: Wounding Event: Gradually Appeared Comorbid Asthma, Sleep Apnea, Arrhythmia, Congestive Date Acquired: 01/06/2019 History: Heart Failure, Hypertension, Type II Diabetes Weeks Of Treatment: 21 Clustered Wound: No Photos Wound Measurements Length: (cm) 0.7 %  Reduct Width: (cm) 0.5 % Reduct Depth: (cm) 0.1 Epitheli Area: (cm) 0.275 Tunneli Volume: (cm) 0.027 Wound Description Classification: Full Thickness Without Exposed Support Foul Od Structures Slough/ Wound Flat and Intact Margin: Exudate Small Amount: Exudate Serosanguineous Type: Exudate red, brown Color: Wound Bed Granulation Amount: Large (67-100%) Granulation Quality: Red Fascia Exp Necrotic Amount: None Present (0%) Fat Layer Tendon Exp Muscle Exp Joint Expo Bone Expos or After Cleansing: No Fibrino No Exposed Structure osed: No (Subcutaneous Tissue) Exposed: Yes osed: No osed: No sed: No ed: No ion in Area: 30% ion in Volume: 30.8% alization: Large (67-100%) ng: No Treatment Notes Wound #6 (Right, Proximal, Posterior Lower Leg) 1. Cleanse With Wound Cleanser Soap and water 2. Periwound Care Moisturizing lotion 3. Primary Dressing Applied Calcium Alginate Ag 4. Secondary Dressing ABD Pad 6. Support Layer Applied 3 layer compression wrap Notes netting Electronic Signature(s) Signed: 06/03/2019 5:07:56 PM By: Mikeal Hawthorne EMT/HBOT Signed: 06/07/2019 6:26:19 PM By: [name] Hurst RN, BSN Previous Signature: 06/02/2019 5:32:06 PM Version By: [name] Hurst RN, BSN Entered By: Mikeal Hawthorne on 06/03/2019 14:03:34 -------------------------------------------------------------------------------- Wound Assessment Details Patient Name: Date of Service: [name] 06/02/2019 2:00 PM Medical Record [id number] Patient Account Number: [id number] Date of Birth/Sex: Treating RN: 04-05-1956 (63 y.o. [name] Primary Care Catilyn Boggus: [name] Other Clinician: Referring Leeroy Lovings: Treating Shantrice Rodenberg/Extender:[name], [name], [name] in Treatment: 44 Wound Status Wound Number: 8 Primary Venous Leg Ulcer Etiology: Wound Location: Left Lower Leg - Anterior Wound Open Wounding Event: Gradually  Appeared Status: Date Acquired: 02/14/2019 Comorbid Asthma, Sleep Apnea, Arrhythmia, Congestive Weeks Of Treatment: 15 History: Heart Failure, Hypertension, Type II Diabetes Clustered Wound: No Photos Wound Measurements Length: (cm) 1.3 % Reduct Width: (cm) 0.5 % Reduct Depth: (cm) 0.1 Epitheli Area: (cm) 0.511 Tunneli Volume: (cm) 0.051 Undermi Wound Description Classification: Full Thickness Without Exposed Support Foul Odo Structures Slough/F Wound Flat and Intact Margin: Exudate Small Amount: Exudate Serosanguineous Type: Exudate red, brown Color: Wound Bed Granulation Amount: Large (67-100%) Granulation Quality: Red Fascia E Necrotic Amount: None Present (0%) Fat Laye Tendon E Muscle E Joint Ex Bone Exp r After Cleansing: No ibrino No Exposed Structure xposed: No r (Subcutaneous Tissue) Exposed: Yes xposed: No xposed: No posed: No osed: No ion in Area: 91.9% ion in Volume: 91.9% alization: Medium (34-66%) ng: No ning: No Treatment Notes Wound #8 (Left,  Anterior Lower Leg) 1. Cleanse With Wound Cleanser Soap and water 2. Periwound Care Moisturizing lotion 3. Primary Dressing Applied Calcium Alginate Ag 4. Secondary Dressing ABD Pad 6. Support Layer Applied 3 layer compression wrap Notes netting Electronic Signature(s) Signed: 06/03/2019 5:07:56 PM By: Mikeal Hawthorne EMT/HBOT Signed: 06/07/2019 6:26:19 PM By: [name] Hurst RN, BSN Previous Signature: 06/02/2019 5:32:06 PM Version By: [name] Hurst RN, BSN Previous Signature: 06/02/2019 5:32:06 PM Version By: [name] Hurst RN, BSN Entered By: Mikeal Hawthorne on 06/03/2019 14:07:44 -------------------------------------------------------------------------------- Vitals Details Patient Name: Date of Service: [name]. 06/02/2019 2:00 PM Medical Record [id number] Patient Account Number: [id number] Date of Birth/Sex: Treating RN: 20-Sep-1955 (63 y.o. [name] Primary  Care Lakeita Panther: [name] Other Clinician: Referring Jaedin Trumbo: Treating Spyridon Hornstein/Extender:[name], [name], [name] in Treatment: 44 Vital Signs Time Taken: 13:50 Temperature (F): 97.7 Height (in): 73 Pulse (bpm): 63 Weight (lbs): 290 Respiratory Rate (breaths/min): 18 Body Mass Index (BMI): 38.3 Blood Pressure (mmHg): 140/81 Reference Range: 80 - 120 mg / dl Electronic Signature(s) Signed: 06/02/2019 5:32:06 PM By: [name] Hurst RN, BSN Entered By: [name] on 06/02/2019 13:51:48

## 2019-06-02 NOTE — Progress Notes (Signed)
LERAY, GRUMBINE ([name]) Visit Report for 06/02/2019 HPI Details Patient Name: Date of Service: [name], [name] 06/02/2019 2:00 PM Medical Record [name] Patient Account Number: [id number] Date of Birth/Sex: Treating RN: 06/21/1955 (63 y.o. [name] Primary Care Provider: [name] Other Clinician: Referring Provider: Treating Provider/Extender:[name], [name], [name] in Treatment: 44 History of Present Illness HPI Description: ADMISSION 07/29/2018 [name] is a 63 year old man with either prediabetes or diabetes he is on glipizide. In late October to November 2019 he noted a scabbed area on the back of his right calf. He picked this off a few times but it would not heal. He saw his primary physician on 12/5. It was felt at that time that this may actually heal on its own with conservative management. The next visit was on 07/20/2018 noted the area was a lot worse and arranged for his treatment here. He has been topical antibiotics like Neosporin although he stopped using this when the wound looked worse. He is just been covering this with a clean Band-Aid. He is given Bactrim a week ago and he is finishing this currently. It is made some improvement in the surrounding erythema per the patient. He does not have a history of nonhealing wounds. No prior history of wounds on his legs that he had difficulty healing. No prior skin issues. 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 Past medical history; A. fib status post ablation and recent cardioversion he is on Xarelto. He has a history of systolic heart failure, cervical radiculopathy, cardiomyopathy and type 2 diabetes as discussed ABI in the right leg was noncompressible on the right 2/20; the biopsy I did on the patient last week was negative for malignancy. Culture grew Pseudomonas and methicillin sensitive staph aureus. He is on cefdinir 300 twice a day. I will have to  make this a 10 day course. He put him in compression. He states that the redness pain and erythema are a lot better. I suspect the patient has chronic venous insufficiency probably with a secondary cellulitis 2/27; arrives today with copious amounts of drainage coming out of the wound irritating the skin below the wound area. He only renewed the final 3 days of cefdinir today 3/5; came in for a nurse change 3 days ago. Again a lot of drainage noted. Zinc oxide was applied unfortunately the drainage appears to a pool then he has a string of superficial open areas extending down into the Achilles area and some just below the wound. The wound itself does not look too bad. He is completed the antibiotics although apparently there was separation from the original 7 with the last 3 days. Culture grew a few MSSA and a few Pseudomonas 3/12; not too much difference over the last week. He came in for a dressing change on Monday by our nursing staff. Necrotic debris again over the wound surface. The entire area looks irritated but nontender and I do not think shows obvious evidence of infection. We have not heard anything about the reflux studies. Also notable than in this diabetic man we had noncompressible vessels and although I can feel his pulses easily in his feet I will order arterial studies as well. 3/19-Patient had experience more pain has been dressed with a silver alginate, the wounds all have necrotic debris, very friable with easy bleeding with any kind of surface debridement including with gauze and Anasept and with a #3 curette. His vascular appointments unfortunately were all canceled on  account of the virus outbreak resulting in studies only being done for emergent cases. His pulses are easily palpable in the lower extremity. He has open areas below the primary wound where he states the drainage which included purulent material also with some blood pooled and caused breakdown. 3/26; the  patient was changed to Santyl with [facility] backing last week. This was largely because the silver alginate was sticking to the wound. He is still having quite a bit of pain. He tells [place] that the reflux studies and arterial studies we had attempted to arrange through vein and vascular are not being booked until mid May. Culture that was done last week showed both Serratia moderate and a few methicillin sensitive staph aureus I started him on Bactrim DS 1 p.o. twice daily on 3/23 for 7 days. He is here for follow-up. 4/3; patient is on Santyl with Hydrofera Blue. Patient complains of pain. Our intake nurse is noted drainage and some odor. He has completed Bactrim recently for Serratia and a few methicillin sensitive staph aureus. After our staff push hard last week we were able to get venous studies as well as arterial studies. His arterial studies showed on the right great toe pressure of 0.86. On the left ABI at 1.47 TBI of 0.87. On both sides waveforms were triphasic VENOUS STUDIES showed reflux in the femoral vein, popliteal vein great saphenous vein at the saphenofemoral junction and great saphenous vein at the proximal thigh. Also noted to have age-indeterminate superficial vein thrombosis involving the small saphenous vein. Notable that the patient is already on Xarelto for atrial fibrillation. 4/9; the patient has been to see vascular surgery and reviewed by Dr. [name]. He was felt to have only minimal superficial venous reflux in the right great greater saphenous vein. For this reason he was not felt to be a candidate for laser ablation of the right greater saphenous vein. A wedge cushion was suggested to keep his leg elevated. He does have deep venous reflux on the right. Culture I did last time showed a combination of Serratia Pseudomonas and methicillin sensitive staph aureus. We have been using Hydrofera Blue to the large wound, Santyl to some of the deep punched out satellite  lesions distal to it. There is some improvement [id number]; the application for Apligraf was put out for further review for material that we have resubmitted. He has the deep area on the right posterior calf which is large and then 3 small punched out areas beneath this. In general his wounds look a lot better 4/23; the patient has his large original wound and then 2 punched out satellite lesions below it on the right posterior calf. I was usually able to use Apligraf #1 to cover the full surface area of the larger wound. We continued with Santyl and Hydrofera Blue on the 2 punched-out areas 5/7; patient has his large original wound and 2 punched out satellite lesions below it in the right posterior calf. Apligraf #2 today. Major improvement in the big wound in terms of wound depth. Punched-out [place] wounds have a very healthy looking wound bed. 5/21-Patient comes back for his large right posterior calf wound for which she has been an Apligraf #3 today. Wound depth seems to be better, the punched-out wounds appear to have healthy bed with some bleeding 6/4; right posterior calf wound is much better. Apligraf #4 today. The major wound has no wound depth at all. Only 1 of the satellite lesions is still open. The patient  has very high blood pressure coming in today with a diastolic blood pressure of 130 after lying there for a few minutes it was down to 110. He states that is running [id number] diastolic at home. He was high last week as well I thought he was on 50 mg 1/2 tablet twice daily of losartan I told him to double up on that however it turns out he is actually on 50 twice daily. Course he is run out of his medications prematurely. He has a follow-up with his primary's office next Wednesday. 6/18; patient's wounds continue to improve. The small area laterally is just about closed and there is continued epithelialization on the area on the posterior calf. 7/2; we applied his last Apligraf last  visit. Early the next week there was a lot of purulent looking drainage that I cultured this grew staph and Pseudomonas however when we had him back for the next visit to 3 days later everything looked a lot better. He did not receive systemic antibiotics. Fortunately we did not have to remove the Apligraf. He has had heart trouble this week he was having an ablation apparently they have discovered a clot in his left atrium they have changed all his anticoagulants as well as his blood pressure medication. 7/16; using Hydrofera Blue. We are making nice progress towards closure. He has not yet ordered his compression stockings he has his measurements 7/23; dimensions are better. He has a new satellite area superiorly. Both wounds cauterized with silver nitrate. 7/30; absolutely no change in the major wound on the posterior part of the right calf. We have been using Hydrofera Blue for a prolonged period of time and really had a nice improvement but over the last few weeks this is stalled. There is no depth. He arrived in clinic today with a new area on the right anterior tibia which apparently was an "ingrown hair". It is clearly an open area. This looks like a wrap injury although he was not really aware of it. 8/31; since the patient was last in clinic he was admitted to hospital from 8/17 through 8/22. He had had multiple falls at home including one off a ladder. He was admitted to hospital with mild COVID-19 viral pneumonia. Noted to be hyperkalemic and in acute renal failure. He has chronic systolic heart failure with ejection fraction of 35%. He arrives back in clinic with the original wound on the posterior aspect of the right lower calf looking a lot better. He has eschared areas from falls and lacerations on his anterior knees bilaterally just below the patella and on the mid part of his tibia bilaterally. He seems to have a small area on the right lateral ankle as well 9/10- Patient comes  to clinic with a 2 new wounds one on the left anterior shin and one on the right posterior leg both the left knee and right knee areas are healed up. We have been using Santyl to the left anterior leg and silver alginate to right posterior leg wounds. 9/17; the patient is original wound looks good on the posterior right calf. He has traumatic areas on the left anterior shin left anterior patellar tendon and the right mid tibia area. Except for the right posterior calf all required debridement. We have been using Santyl on these areas and silver alginate posteriorly right calf 9/24; the original wound on the posterior calf on the right looks good. This is healthy. There are traumatic wounds in the left anterior shin,  left anterior patella in the right mid tibia area are about the same. He has dusky erythema on the left anterior tibia which led me to give him antibiotics last week. This does not look too much different. This is nontender and I wonder if this is all just venous inflammation. 10/2; the patient has 2 open areas on the right posterior calf both of these look good we have been using silver alginate. He has traumatic wounds on the left and right mid tibia areas. The surface of these wounds is still not ready for closure. We have been using Iodoflex. Finally has areas on the left knee. The latter wounds are all traumatic after a fall 10/9. His original wounds of the right posterior calf are superficial and progressing towards closure. His traumatic wounds on the left anterior tibia and right anterior tibia are considerably improved in terms of the wound surface. He asked me about a skin graft in these areas I do not think this is going to be necessary. Change from Iodosorb to [facility]. The area on the left knee is just about closed as well 10/16; his original wound on the right posterior calf very superficial looks healthy. He has more recent traumatic wounds on the left anterior  tibia and right anterior tibia both of these have better looking surfaces and measuring smaller. Might be beneficial for an advanced treatment product. The area on his left knee has a small superficial scab and we are going to close that when 10/23; his original wound areas on the right posterior calf continue to improve. The more recently traumatic wounds on the left anterior tibia and right anterior tibia both look better in terms of surfaces. No debridement required. We have been using Hydrofera Blue. He is approved for Apligraf 11/3; Apligraf #1 applied to the left anterior tibia right anterior tibia and to 2 small areas remaining on the right posterior calf which were part of his initial wound area. 11/19; Apligraf #2. Left anterior tibia is healed. Right anterior tibia much better. Only one small area remains on the right posterior calf which was part of his initial wound area 12/3; the left anterior tibia has opened up again. Hyper granulated nodule. Right anterior tibia is superficial and larger. There is no depth of this. I did not place Apligraf today return to [facility] under compression 12/10; the patient has 2 remaining wounds 1 on the right posterior and the other on the left anterior. Both of these look quite good. No debridement is required we have been using Hydrofera Blue under compression. 12/17; the patient has really not closed over which is disappointing. His wraps were very tight and there may be some rapid injury issues. On the left he has the original wound on the medial mid tibia he has a wrap injury on the lateral mid tibia. On the right his posterior areas are superficial but certainly not closed Electronic Signature(s) Signed: 06/02/2019 5:01:00 PM By: Linton Ham MD Entered By: Linton Ham on 06/02/2019 14:33:23 -------------------------------------------------------------------------------- Physical Exam Details Patient Name: Date of  Service: [name] 06/02/2019 2:00 PM Medical Record [id number] Patient Account Number: [id number] Date of Birth/Sex: Treating RN: May 11, 1956 (63 y.o. [name] Primary Care Provider: [name] Other Clinician: Referring Provider: Treating Provider/Extender:Julieanne Hadsall, [name], [name] in Treatment: 44 Constitutional Sitting or standing Blood Pressure is within target range for patient.. Pulse regular and within target range for patient.[name] Respirations regular, non-labored and within target range.. Temperature is normal  and within the target range for the patient.[name] Appears in no distress. Eyes Conjunctivae clear. No discharge.no icterus. Respiratory work of breathing is normal. Cardiovascular Edema is well controlled. Lymphatic None palpable in the popliteal area.[name] Psychiatric appears at normal baseline. Notes Wound exam; right anterior mid tibia is healed. Posterior right calf as 2 small circular superficial open areas that are nonclosed. On the leftmedial anterior tibia this is open but shallow and superficial he appears to have a wrap injury on the lateral part. There is no evidence of infection Electronic Signature(s) Signed: 06/02/2019 5:01:00 PM By: Linton Ham MD Entered By: Linton Ham on 06/02/2019 14:36:09 -------------------------------------------------------------------------------- Physician Orders Details Patient Name: Date of Service: [name] 06/02/2019 2:00 PM Medical Record [id number] Patient Account Number: [id number] Date of Birth/Sex: Treating RN: [birth year] (64 y.o. [name], Tammi Klippel Primary Care Provider: [name] Other Clinician: Referring Provider: Treating Provider/Extender:Remigio Mcmillon, [name], [name] in Treatment: 89 Verbal / Phone Orders: No Diagnosis Coding ICD-10 Coding Code Description [phone] Non-pressure chronic ulcer of right calf with fat layer  exposed E11.622 Type 2 diabetes mellitus with other skin ulcer I87.321 Chronic venous hypertension (idiopathic) with inflammation of right lower extremity S81.811D Laceration without foreign body, right lower leg, subsequent encounter S81.812D Laceration without foreign body, left lower leg, subsequent encounter L03.115 Cellulitis of right lower limb Follow-up Appointments Return Appointment in 2 weeks. - Thursday Nurse Visit: - Wednesday Dressing Change Frequency Do not change entire dressing for one week. - both lower legs. Skin Barriers/Peri-Wound Care Moisturizing lotion - both legs Wound Cleansing May shower with protection. Primary Wound Dressing Wound #6 Right,Proximal,Posterior Lower Leg Calcium Alginate with Silver Wound #8 Left,Anterior Lower Leg Calcium Alginate with Silver Secondary Dressing Wound #6 Right,Proximal,Posterior Lower Leg Foam - apply over wounds. apply foam for protection to anterior portion of lower leg. Wound #8 Left,Anterior Lower Leg Foam - apply over wounds. apply foam for protection to anterior portion of lower leg. Edema Control 3 Layer Compression System - Bilateral Patient to wear own compression stockings - patient to bring in on next wound care appointment. Avoid standing for long periods of time Elevate legs to the level of the heart or above for 30 minutes daily and/or when sitting, a frequency of: - 3-4 times a day. Exercise regularly Electronic Signature(s) Signed: 06/02/2019 5:01:00 PM By: Linton Ham MD Signed: 06/02/2019 5:34:18 PM By: Deon Pilling Entered By: Deon Pilling on 06/02/2019 14:27:17 -------------------------------------------------------------------------------- Problem List Details Patient Name: Date of Service: [name] 06/02/2019 2:00 PM Medical Record [id number] Patient Account Number: [id number] Date of Birth/Sex: Treating RN: [birth year] (63 y.o. [name], [name] Primary Care Provider:  [name] Other Clinician: Referring Provider: Treating Provider/Extender:Gilberto Streck, [name], [name] in Treatment: 44 Active Problems ICD-10 Evaluated Encounter Code Description Active Date Today Diagnosis L97.212 Non-pressure chronic ulcer of right calf with fat layer 07/29/2018 No Yes exposed E11.622 Type 2 diabetes mellitus with other skin ulcer 07/29/2018 No Yes I87.321 Chronic venous hypertension (idiopathic) with 07/29/2018 No Yes inflammation of right lower extremity S81.811D Laceration without foreign body, right lower leg, 02/14/2019 No Yes subsequent encounter S81.812D Laceration without foreign body, left lower leg, 02/14/2019 No Yes subsequent encounter L03.115 Cellulitis of right lower limb 09/24/2018 No Yes Inactive Problems Resolved Problems Electronic Signature(s) Signed: 06/02/2019 5:01:00 PM By: Linton Ham MD Entered By: Linton Ham on 06/02/2019 14:32:22 -------------------------------------------------------------------------------- Progress Note Details Patient Name: Date of Service: [name] 06/02/2019 2:00 PM Medical Record [id number] Patient Account Number: [id number] Date of Birth/Sex:  Treating RN: 30-Jul-1955 (63 y.o. [name] Primary Care Provider: [name] Other Clinician: Referring Provider: Treating Provider/Extender:Isabela Nardelli, [name], [name] in Treatment: 44 Subjective History of Present Illness (HPI) ADMISSION 07/29/2018 Mr. Hetchler is a 63 year old man with either prediabetes or diabetes he is on glipizide. In late October to November 2019 he noted a scabbed area on the back of his right calf. He picked this off a few times but it would not heal. He saw his primary physician on 12/5. It was felt at that time that this may actually heal on its own with conservative management. The next visit was on 07/20/2018 noted the area was a lot worse and arranged for his treatment  here. He has been topical antibiotics like Neosporin although he stopped using this when the wound looked worse. He is just been covering this with a clean Band-Aid. He is given Bactrim a week ago and he is finishing this currently. It is made some improvement in the surrounding erythema per the patient. He does not have a history of nonhealing wounds. No prior history of wounds on his legs that he had difficulty healing. No prior skin issues. 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 Past medical history; A. fib status post ablation and recent cardioversion he is on Xarelto. He has a history of systolic heart failure, cervical radiculopathy, cardiomyopathy and type 2 diabetes as discussed ABI in the right leg was noncompressible on the right 2/20; the biopsy I did on the patient last week was negative for malignancy. Culture grew Pseudomonas and methicillin sensitive staph aureus. He is on cefdinir 300 twice a day. I will have to make this a 10 day course. He put him in compression. He states that the redness pain and erythema are a lot better. I suspect the patient has chronic venous insufficiency probably with a secondary cellulitis 2/27; arrives today with copious amounts of drainage coming out of the wound irritating the skin below the wound area. He only renewed the final 3 days of cefdinir today 3/5; came in for a nurse change 3 days ago. Again a lot of drainage noted. Zinc oxide was applied unfortunately the drainage appears to a pool then he has a string of superficial open areas extending down into the Achilles area and some just below the wound. The wound itself does not look too bad. He is completed the antibiotics although apparently there was separation from the original 7 with the last 3 days. Culture grew a few MSSA and a few Pseudomonas 3/12; not too much difference over the last week. He came in for a dressing change on Monday by our nursing staff. Necrotic debris  again over the wound surface. The entire area looks irritated but nontender and I do not think shows obvious evidence of infection. We have not heard anything about the reflux studies. Also notable than in this diabetic man we had noncompressible vessels and although I can feel his pulses easily in his feet I will order arterial studies as well. 3/19-Patient had experience more pain has been dressed with a silver alginate, the wounds all have necrotic debris, very friable with easy bleeding with any kind of surface debridement including with gauze and Anasept and with a #3 curette. His vascular appointments unfortunately were all canceled on account of the virus outbreak resulting in studies only being done for emergent cases. His pulses are easily palpable in the lower extremity. He has open areas below the primary  wound where he states the drainage which included purulent material also with some blood pooled and caused breakdown. 3/26; the patient was changed to Santyl with [facility] backing last week. This was largely because the silver alginate was sticking to the wound. He is still having quite a bit of pain. He tells [place] that the reflux studies and arterial studies we had attempted to arrange through vein and vascular are not being booked until mid May. Culture that was done last week showed both Serratia moderate and a few methicillin sensitive staph aureus I started him on Bactrim DS 1 p.o. twice daily on 3/23 for 7 days. He is here for follow-up. 4/3; patient is on Santyl with Hydrofera Blue. Patient complains of pain. Our intake nurse is noted drainage and some odor. He has completed Bactrim recently for Serratia and a few methicillin sensitive staph aureus. After our staff push hard last week we were able to get venous studies as well as arterial studies. His arterial studies showed on the right great toe pressure of 0.86. On the left ABI at 1.47 TBI of 0.87. On both sides  waveforms were triphasic VENOUS STUDIES showed reflux in the femoral vein, popliteal vein great saphenous vein at the saphenofemoral junction and great saphenous vein at the proximal thigh. Also noted to have age-indeterminate superficial vein thrombosis involving the small saphenous vein. Notable that the patient is already on Xarelto for atrial fibrillation. 4/9; the patient has been to see vascular surgery and reviewed by Dr. [name]. He was felt to have only minimal superficial venous reflux in the right great greater saphenous vein. For this reason he was not felt to be a candidate for laser ablation of the right greater saphenous vein. A wedge cushion was suggested to keep his leg elevated. He does have deep venous reflux on the right. Culture I did last time showed a combination of [place] Pseudomonas and methicillin sensitive staph aureus. We have been using Hydrofera Blue to the large wound, Santyl to some of the deep punched out satellite lesions distal to it. There is some improvement [id number]; the application for Apligraf was put out for further review for material that we have resubmitted. He has the deep area on the right posterior calf which is large and then 3 small punched out areas beneath this. In general his wounds look a lot better 4/23; the patient has his large original wound and then 2 punched out satellite lesions below it on the right posterior calf. I was usually able to use Apligraf #1 to cover the full surface area of the larger wound. We continued with Santyl and Hydrofera Blue on the 2 punched-out areas 5/7; patient has his large original wound and 2 punched out satellite lesions below it in the right posterior calf. Apligraf #2 today. Major improvement in the big wound in terms of wound depth. Punched-out [place] wounds have a very healthy looking wound bed. 5/21-Patient comes back for his large right posterior calf wound for which she has been an Apligraf #3  today. Wound depth seems to be better, the punched-out wounds appear to have healthy bed with some bleeding 6/4; right posterior calf wound is much better. Apligraf #4 today. The major wound has no wound depth at all. Only 1 of the satellite lesions is still open. The patient has very high blood pressure coming in today with a diastolic blood pressure of 130 after lying there for a few minutes it was down to 110. He states that  is running [id number] diastolic at home. He was high last week as well I thought he was on 50 mg 1/2 tablet twice daily of losartan I told him to double up on that however it turns out he is actually on 50 twice daily. Course he is run out of his medications prematurely. He has a follow-up with his primary's office next Wednesday. 6/18; patient's wounds continue to improve. The small area laterally is just about closed and there is continued epithelialization on the area on the posterior calf. 7/2; we applied his last Apligraf last visit. Early the next week there was a lot of purulent looking drainage that I cultured this grew staph and Pseudomonas however when we had him back for the next visit to 3 days later everything looked a lot better. He did not receive systemic antibiotics. Fortunately we did not have to remove the Apligraf. He has had heart trouble this week he was having an ablation apparently they have discovered a clot in his left atrium they have changed all his anticoagulants as well as his blood pressure medication. 7/16; using Hydrofera Blue. We are making nice progress towards closure. He has not yet ordered his compression stockings he has his measurements 7/23; dimensions are better. He has a new satellite area superiorly. Both wounds cauterized with silver nitrate. 7/30; absolutely no change in the major wound on the posterior part of the right calf. We have been using Hydrofera Blue for a prolonged period of time and really had a nice improvement but  over the last few weeks this is stalled. There is no depth. He arrived in clinic today with a new area on the right anterior tibia which apparently was an "ingrown hair". It is clearly an open area. This looks like a wrap injury although he was not really aware of it. 8/31; since the patient was last in clinic he was admitted to hospital from 8/17 through 8/22. He had had multiple falls at home including one off a ladder. He was admitted to hospital with mild COVID-19 viral pneumonia. Noted to be hyperkalemic and in acute renal failure. He has chronic systolic heart failure with ejection fraction of 35%. He arrives back in clinic with the original wound on the posterior aspect of the right lower calf looking a lot better. He has eschared areas from falls and lacerations on his anterior knees bilaterally just below the patella and on the mid part of his tibia bilaterally. He seems to have a small area on the right lateral ankle as well 9/10- Patient comes to clinic with a 2 new wounds one on the left anterior shin and one on the right posterior leg both the left knee and right knee areas are healed up. We have been using Santyl to the left anterior leg and silver alginate to right posterior leg wounds. 9/17; the patient is original wound looks good on the posterior right calf. He has traumatic areas on the left anterior shin left anterior patellar tendon and the right mid tibia area. Except for the right posterior calf all required debridement. We have been using Santyl on these areas and silver alginate posteriorly right calf 9/24; the original wound on the posterior calf on the right looks good. This is healthy. There are traumatic wounds in the left anterior shin, left anterior patella in the right mid tibia area are about the same. He has dusky erythema on the left anterior tibia which led me to give him antibiotics last week.  This does not look too much different. This is nontender and I  wonder if this is all just venous inflammation. 10/2; the patient has 2 open areas on the right posterior calf both of these look good we have been using silver alginate. He has traumatic wounds on the left and right mid tibia areas. The surface of these wounds is still not ready for closure. We have been using Iodoflex. Finally has areas on the left knee. The latter wounds are all traumatic after a fall 10/9. His original wounds of the right posterior calf are superficial and progressing towards closure. His traumatic wounds on the left anterior tibia and right anterior tibia are considerably improved in terms of the wound surface. He asked me about a skin graft in these areas I do not think this is going to be necessary. Change from Iodosorb to [facility]. ooThe area on the left knee is just about closed as well 10/16; his original wound on the right posterior calf very superficial looks healthy. He has more recent traumatic wounds on the left anterior tibia and right anterior tibia both of these have better looking surfaces and measuring smaller. Might be beneficial for an advanced treatment product. The area on his left knee has a small superficial scab and we are going to close that when 10/23; his original wound areas on the right posterior calf continue to improve. The more recently traumatic wounds on the left anterior tibia and right anterior tibia both look better in terms of surfaces. No debridement required. We have been using Hydrofera Blue. He is approved for Apligraf 11/3; Apligraf #1 applied to the left anterior tibia right anterior tibia and to 2 small areas remaining on the right posterior calf which were part of his initial wound area. 11/19; Apligraf #2. Left anterior tibia is healed. Right anterior tibia much better. Only one small area remains on the right posterior calf which was part of his initial wound area 12/3; the left anterior tibia has opened up again. Hyper  granulated nodule. Right anterior tibia is superficial and larger. There is no depth of this. I did not place Apligraf today return to [facility] under compression 12/10; the patient has 2 remaining wounds 1 on the right posterior and the other on the left anterior. Both of these look quite good. No debridement is required we have been using Hydrofera Blue under compression. 12/17; the patient has really not closed over which is disappointing. His wraps were very tight and there may be some rapid injury issues. On the left he has the original wound on the medial mid tibia he has a wrap injury on the lateral mid tibia. On the right his posterior areas are superficial but certainly not closed Objective Constitutional Sitting or standing Blood Pressure is within target range for patient.. Pulse regular and within target range for patient.[name] Respirations regular, non-labored and within target range.. Temperature is normal and within the target range for the patient.[name] Appears in no distress. Vitals Time Taken: 1:50 PM, Height: 73 in, Weight: 290 lbs, BMI: 38.3, Temperature: 97.7 F, Pulse: 63 bpm, Respiratory Rate: 18 breaths/min, Blood Pressure: 140/81 mmHg. Eyes Conjunctivae clear. No discharge.no icterus. Respiratory work of breathing is normal. Cardiovascular Edema is well controlled. Lymphatic None palpable in the popliteal area.[name] Psychiatric appears at normal baseline. General Notes: Wound exam; right anterior mid tibia is healed. Posterior right calf as 2 small circular superficial open areas that are nonclosed. On the leftmedial anterior tibia this is  open but shallow and superficial he appears to have a wrap injury on the lateral part. There is no evidence of infection Integumentary (Hair, Skin) Wound #6 status is Open. Original cause of wound was Gradually Appeared. The wound is located on the Right,Proximal,Posterior Lower Leg. The wound measures 0.7cm length x 0.5cm width x  0.1cm depth; 0.275cm^2 area and 0.027cm^3 volume. There is Fat Layer (Subcutaneous Tissue) Exposed exposed. There is no tunneling noted. There is a small amount of serosanguineous drainage noted. The wound margin is flat and intact. There is large (67-100%) red granulation within the wound bed. There is no necrotic tissue within the wound bed. Wound #8 status is Open. Original cause of wound was Gradually Appeared. The wound is located on the Left,Anterior Lower Leg. The wound measures 1.3cm length x 0.5cm width x 0.1cm depth; 0.511cm^2 area and 0.051cm^3 volume. There is Fat Layer (Subcutaneous Tissue) Exposed exposed. There is no tunneling or undermining noted. There is a small amount of serosanguineous drainage noted. The wound margin is flat and intact. There is large (67-100%) red granulation within the wound bed. There is no necrotic tissue within the wound bed. Assessment Active Problems ICD-10 Non-pressure chronic ulcer of right calf with fat layer exposed Type 2 diabetes mellitus with other skin ulcer Chronic venous hypertension (idiopathic) with inflammation of right lower extremity Laceration without foreign body, right lower leg, subsequent encounter Laceration without foreign body, left lower leg, subsequent encounter Cellulitis of right lower limb Procedures Wound #6 Pre-procedure diagnosis of Wound #6 is a Venous Leg Ulcer located on the Right,Proximal,Posterior Lower Leg . There was a Three Layer Compression Therapy Procedure by Carlene Coria, RN. Post procedure Diagnosis Wound #6: Same as Pre-Procedure Wound #8 Pre-procedure diagnosis of Wound #8 is a Venous Leg Ulcer located on the Left,Anterior Lower Leg . There was a Three Layer Compression Therapy Procedure by Carlene Coria, RN. Post procedure Diagnosis Wound #8: Same as Pre-Procedure Plan Follow-up Appointments: Return Appointment in 2 weeks. - Thursday Nurse Visit: - Wednesday Dressing Change Frequency: Do not  change entire dressing for one week. - both lower legs. Skin Barriers/Peri-Wound Care: Moisturizing lotion - both legs Wound Cleansing: May shower with protection. Primary Wound Dressing: Wound #6 Right,Proximal,Posterior Lower Leg: Calcium Alginate with Silver Wound #8 Left,Anterior Lower Leg: Calcium Alginate with Silver Secondary Dressing: Wound #6 Right,Proximal,Posterior Lower Leg: Foam - apply over wounds. apply foam for protection to anterior portion of lower leg. Wound #8 Left,Anterior Lower Leg: Foam - apply over wounds. apply foam for protection to anterior portion of lower leg. Edema Control: 3 Layer Compression System - Bilateral Patient to wear own compression stockings - patient to bring in on next wound care appointment. Avoid standing for long periods of time Elevate legs to the level of the heart or above for 30 minutes daily and/or when sitting, a frequency of: - 3-4 times a day. Exercise regularly I change the primary dressing to silver alginate Foam Dropped compression from 4-3 layers Electronic Signature(s) Signed: 06/02/2019 5:01:00 PM By: Linton Ham MD Entered By: Linton Ham on 06/02/2019 14:39:48 -------------------------------------------------------------------------------- SuperBill Details Patient Name: Date of Service: [name] 06/02/2019 Medical Record [id number] Patient Account Number: [id number] Date of Birth/Sex: Treating RN: 1955-06-27 (63 y.o. [name] Primary Care Provider: [name] Other Clinician: Referring Provider: Treating Provider/Extender:Izaia Say, [name], [name] in Treatment: 44 Diagnosis Coding ICD-10 Codes Code Description [phone] Non-pressure chronic ulcer of right calf with fat layer exposed E11.622 Type 2 diabetes mellitus with other  skin ulcer I87.321 Chronic venous hypertension (idiopathic) with inflammation of right lower extremity S81.811D Laceration without  foreign body, right lower leg, subsequent encounter S81.812D Laceration without foreign body, left lower leg, subsequent encounter L03.115 Cellulitis of right lower limb Facility Procedures CPT4: Code [id number] Description: [id number] BILATERAL: Application of multi-layer venous compression system; leg (below knee), including ankle and foot. Modifier Quantity: 1 Physician Procedures Electronic Signature(s) Signed: 06/02/2019 5:01:00 PM By: Linton Ham MD Entered By: Linton Ham on 06/02/2019 14:40:08

## 2019-06-04 ENCOUNTER — Other Ambulatory Visit (HOSPITAL_COMMUNITY): Payer: Self-pay | Admitting: Internal Medicine

## 2019-06-07 ENCOUNTER — Other Ambulatory Visit: Payer: Self-pay

## 2019-06-07 ENCOUNTER — Ambulatory Visit: Payer: BLUE CROSS/BLUE SHIELD | Admitting: Cardiology

## 2019-06-07 ENCOUNTER — Other Ambulatory Visit: Payer: Self-pay | Admitting: Cardiology

## 2019-06-07 ENCOUNTER — Encounter: Payer: Self-pay | Admitting: Cardiology

## 2019-06-07 VITALS — BP 148/88 | HR 58 | Temp 96.8°F | Ht 73.0 in | Wt 299.0 lb

## 2019-06-07 DIAGNOSIS — Z8616 Personal history of COVID-19: Secondary | ICD-10-CM

## 2019-06-07 DIAGNOSIS — I480 Paroxysmal atrial fibrillation: Secondary | ICD-10-CM

## 2019-06-07 DIAGNOSIS — Z712 Person consulting for explanation of examination or test findings: Secondary | ICD-10-CM

## 2019-06-07 DIAGNOSIS — I428 Other cardiomyopathies: Secondary | ICD-10-CM

## 2019-06-07 DIAGNOSIS — R600 Localized edema: Secondary | ICD-10-CM

## 2019-06-07 DIAGNOSIS — Z79899 Other long term (current) drug therapy: Secondary | ICD-10-CM

## 2019-06-07 DIAGNOSIS — I5022 Chronic systolic (congestive) heart failure: Secondary | ICD-10-CM

## 2019-06-07 DIAGNOSIS — Z8619 Personal history of other infectious and parasitic diseases: Secondary | ICD-10-CM

## 2019-06-07 DIAGNOSIS — I110 Hypertensive heart disease with heart failure: Secondary | ICD-10-CM

## 2019-06-07 DIAGNOSIS — I10 Essential (primary) hypertension: Secondary | ICD-10-CM

## 2019-06-07 NOTE — Patient Instructions (Signed)
Medication Instructions:  Your Physician recommend you continue on your current medication as directed.    *If you need a refill on your cardiac medications before your next appointment, please call your pharmacy*  Lab Work: Your physician recommends that you return for lab work today (BMP)  If you have labs (blood work) drawn today and your tests are completely normal, you will receive your results only by: [name] MyChart Message (if you have MyChart) OR . A paper copy in the mail If you have any lab test that is abnormal or we need to change your treatment, we will call you to review the results.  Testing/Procedures: None  Follow-Up: At [facility], you and your health needs are our priority.  As part of our continuing mission to provide you with exceptional heart care, we have created designated Provider Care Teams.  These Care Teams include your primary Cardiologist (physician) and Advanced Practice Providers (APPs -  Physician Assistants and Nurse Practitioners) who all work together to provide you with the care you need, when you need it.  Your next appointment:   3 month(s)  The format for your next appointment:   In Person  Provider:   Buford Dresser, MD

## 2019-06-07 NOTE — Progress Notes (Signed)
Cardiology Office Note:    Date:  06/07/2019   ID:  [name], DOB 11-02-1955, MRN [id number]  PCP:  [name] Koch, MD  Cardiologist:  [name] Dresser, MD PhD  Referring MD: [name], *   CC: follow up  History of Present Illness:    [name] is a 63 y.o. male with a hx of persistent atrial fibrillation, atrial flutter with prior ablation in 2012, prior cardioversions, HTN, chronic systolic heart failure (thought to be tachycardia induced) with EF 30-35% on tte 04/2018, OSA who is seen in follow up today. I first saw him as a new consult 12/28/18.  Cardiac history: 12/14/18 Was planned for afib ablation. However, TEE performed was concerning for thrombus. Also noted to have EF 30%, severe biatrial enlargement. 04/2018: TTE, EF 30-35%, diffuse hypokinesis, biatrial enlargement  Cardiovascular risk factors: Prior clinical ASCVD: no known ASCVD, no prior cath Comorbid conditions, including hypertension, hyperlipidemia, diabetes, chronic kidney disease: yes: HTN, DM, HLD. No formal CKD, but Cr 2.11, had been 1.5-1.6  Today: We reviewed the results of his lexiscan together. Likely apical thinning, no reversible ischemia. Supports NICM.  Check BP weekly at the leg doctor, runs 130s/80s. Legs are wrapped but much improving from report.  Tolerating medications. We discussed next steps for heart failure management. With his prior kidney issues, he wants to give his current medications more time before trying a challenge.  Denies chest pain, shortness of breath at rest or with normal exertion. No PND, orthopnea, LE edema or unexpected weight gain. No syncope or palpitations.   Past Medical History:  Diagnosis Date  . Allergy    SEASONAL  . Asthma    as a teenager - does not use an inhaler although pt said he has an albuteral inhaler  . Atrial fibrillation ([place])    persistant 02/2009  . Atrial flutter ([place])    s/p CTI ablation 08/06/10  . Cardiac  arrhythmia due to congenital heart disease   . Cataract    BEGINING  . Chronic rhinitis   . Chronic systolic dysfunction of left ventricle   . Clotting disorder ([place])    Takes Pradaxa for A- Fib.  Last dose of Pradaxa was 05-06-19  . Colon polyp   . Congestive heart failure ([place])   . Diabetes mellitus without complication ([place])   . Diverticulosis    colonoscopy 04/03/2009  . Hyperlipidemia   . Hypertension   . Hypertension   . Morbid obesity ([place])    target weight = 219  for BMI < 30  . Nonischemic cardiomyopathy ([facility])    tachycardia mediated  . Sleep apnea    original 2001 - wears C-PaP    Past Surgical History:  Procedure Laterality Date  . ATRIAL FIBRILLATION ABLATION N/A 12/14/2018   Procedure: ATRIAL FIBRILLATION ABLATION;  Surgeon: Thompson Grayer, MD;  Location: [place] [facility];  Service: Cardiovascular;  Laterality: N/A;  . atrial flutter ablation  08/06/10  . CARDIOVERSION N/A 07/05/2018   Procedure: CARDIOVERSION;  Surgeon: [name] Dresser, MD;  Location: [facility] ENDOSCOPY;  Service: Cardiovascular;  Laterality: N/A;  . COLONOSCOPY    . POLYPECTOMY    . TEE WITHOUT CARDIOVERSION N/A 12/14/2018   Procedure: TRANSESOPHAGEAL ECHOCARDIOGRAM (TEE);  Surgeon: Thompson Grayer, MD;  Location: [place] [facility];  Service: Cardiovascular;  Laterality: N/A;    Current Medications: Current Outpatient Medications on File Prior to Visit  Medication Sig  . albuterol (PROVENTIL HFA;VENTOLIN HFA) 108 (90 Base) MCG/ACT inhaler Inhale  2 puffs into the lungs every 6 (six) hours as needed for wheezing or shortness of breath.  [name] amiodarone (PACERONE) 200 MG tablet Take 1 tablet (200 mg total) by mouth daily.  . carvedilol (COREG) 6.25 MG tablet Take 1 tablet (6.25 mg total) by mouth 2 (two) times daily with a meal.  . doxylamine, Sleep, (UNISOM) 25 MG tablet Take 50 mg by mouth at bedtime as needed for sleep.   . furosemide (LASIX) 40 MG tablet TAKE 1 TABLET TWICE DAILY  . gabapentin  (NEURONTIN) 300 MG capsule TAKE 1 CAPSULE BY MOUTH THREE TIMES A DAY  . glipiZIDE (GLUCOTROL) 5 MG tablet TAKE 1 TABLET BY MOUTH TWICE A DAY BEFORE MEALS  . hydrALAZINE (APRESOLINE) 50 MG tablet Take 1 tablet (50 mg total) by mouth 2 (two) times daily.  . isosorbide mononitrate (IMDUR) 30 MG 24 hr tablet TAKE 1 TABLET BY MOUTH EVERY DAY  . levothyroxine (SYNTHROID) 50 MCG tablet TAKE 1 TABLET EVERY DAY BEFORE BREAKFAST  . losartan (COZAAR) 100 MG tablet Take 100 mg by mouth daily.   . mometasone (NASONEX) 50 MCG/ACT nasal spray Place 2 sprays into the nose daily as needed (allergies or rhinitis).   [name] PRADAXA 150 MG CAPS capsule TAKE 1 CAPSULE BY MOUTH TWICE A DAY  . rosuvastatin (CRESTOR) 10 MG tablet TAKE 1 TABLET BY MOUTH EVERY DAY  . spironolactone (ALDACTONE) 25 MG tablet Take 25 mg by mouth daily.  . SYMBICORT 80-4.5 MCG/ACT inhaler USE 2 INHALATIONS TWICE A DAY  . traMADol (ULTRAM) 50 MG tablet Take 50 mg by mouth every 6 (six) hours as needed.  . vitamin C (ASCORBIC ACID) 500 MG tablet Take 500-1,000 mg by mouth daily.   Current Facility-Administered Medications on File Prior to Visit  Medication  . technetium tetrofosmin (TC-MYOVIEW) injection [id number] millicurie     Allergies:   Patient has no known allergies.   Social History   Tobacco Use  . Smoking status: Never Smoker  . Smokeless tobacco: Never Used  Substance Use Topics  . Alcohol use: Yes    Alcohol/week: 21.0 standard drinks    Types: 21 Cans of beer per week    Comment: previously quite heavy, working on cessation   . Drug use: No     Family History: The patient's family history includes COPD in his father; Colon cancer in his brother; Hypertension in his mother; Kidney failure in his mother. There is no history of Esophageal cancer, Rectal cancer, or Stomach cancer.  ROS:   Please see the history of present illness.  Additional pertinent ROS: Constitutional: Negative for chills, fever, night sweats, unintentional  weight loss  HENT: Negative for ear pain and hearing loss.   Eyes: Negative for loss of vision and eye pain.  Respiratory: Negative for cough, sputum, wheezing.   Cardiovascular: See HPI. Gastrointestinal: Negative for abdominal pain, melena, and hematochezia.  Genitourinary: Negative for dysuria and hematuria.  Musculoskeletal: Negative for falls and myalgias.  Skin: Negative for itching and rash.  Neurological: Negative for focal weakness, focal sensory changes and loss of consciousness.  Endo/Heme/Allergies: Does bruise/bleed easily.   EKGs/Labs/Other Studies Reviewed:    The following studies were reviewed today: Lexiscan 04/07/19  Nuclear stress EF: 49%.  The left ventricular ejection fraction is mildly decreased (45-54%).  No T wave inversion was noted during stress.  There was no ST segment deviation noted during stress.  Defect 1: There is a small defect of mild severity.  This is an intermediate risk  study.   Small size, mild intensity fixed apical perfusion defect, likely thinning. No significant reversible ischemia. Dilated LV with mild hypokinesis - LVEF 49%. This is an intermediate risk study due to decreased LV function.  TTE 05/03/18 Left ventricle: The cavity size was normal. There was severe   concentric hypertrophy. Systolic function was moderately to   severely reduced. The estimated ejection fraction was in the   range of 30% to 35%. Diffuse hypokinesis. The study was not   technically sufficient to allow evaluation of LV diastolic   dysfunction due to atrial fibrillation. - Aortic valve: There was no regurgitation. - Aortic root: The aortic root was normal in size. - Mitral valve: There was trivial regurgitation. - Left atrium: The atrium was severely dilated. - Right ventricle: Systolic function was normal. - Right atrium: The atrium was moderately dilated. - Tricuspid valve: There was trivial regurgitation. - Pulmonic valve: Transvalvular velocity  was within the normal   range. There was no evidence for stenosis. - Pulmonary arteries: Systolic pressure was within the normal   range. - Inferior vena cava: The vessel was normal in size. The   respirophasic diameter changes were in the normal range (= 50%),   consistent with normal central venous pressure. - Pericardium, extracardiac: There was no pericardial effusion.  Impressions: - When compared to the prior study from 04/02/2017 LVEF has   decreased from 55-60% to 30-35% with diffuse hypokinesis.  TEE 12/14/18  1. The left ventricle has moderate-severely reduced systolic function, with an ejection fraction of 30-35%. The cavity size was moderately dilated. Left ventrical global hypokinesis without regional wall motion abnormalities.  2. The right ventricle has normal systolc function. The cavity was normal. There is no increase in right ventricular wall thickness.  3. Left atrial size was severely dilated.  4. Dense smoke in atria and appendage. Stagnant pre thrombus and likely thrombus in mid to distal LAA High risk for embolic event EP procedure cancelled.  5. Right atrial size was moderately dilated.  6. The mitral valve is grossly normal. Mild thickening of the mitral valve leaflet. Mild calcification of the mitral valve leaflet. Mitral valve regurgitation is mild to moderate by color flow Doppler.  7. Restricted posterior leaflet motion.  8. The aortic valve is tricuspid Moderate thickening of the aortic valve Sclerosis without any evidence of stenosis of the aortic valve. Aortic valve regurgitation is trivial by color flow Doppler.  9. The aortic root is normal in size and structure.  EKG:  EKG is personally reviewed.  The ekg ordered 03/24/19 demonstrates SR, RBBB, LAFB  Recent Labs: 02/02/2019: TSH 13.910 02/05/2019: B Natriuretic Peptide 382.6; Magnesium 1.8 03/08/2019: ALT 14; BUN 25; Creatinine, Ser 1.50; Hemoglobin 14.2; Platelets 255.0; Potassium 4.3; Pro B Natriuretic  peptide (BNP) 215.0; Sodium 131  Recent Lipid Panel    Component Value Date/Time   CHOL 136 03/08/2019 0946   TRIG 58.0 03/08/2019 0946   HDL 64.10 03/08/2019 0946   CHOLHDL 2 03/08/2019 0946   VLDL 11.6 03/08/2019 0946   LDLCALC 61 03/08/2019 0946   LDLDIRECT 81.0 08/31/2015 0905    Physical Exam:    VS:  BP (!) 148/88   Pulse (!) 58   Temp (!) 96.8 F (36 C)   Ht 6\' 1"  (1.854 m)   Wt 299 lb (135.6 kg)   SpO2 96%   BMI 39.45 kg/m     Wt Readings from Last 3 Encounters:  06/07/19 299 lb (135.6 kg)  05/18/19 294 lb (  133.4 kg)  05/10/19 288 lb (130.6 kg)    GEN: Well nourished, well developed in no acute distress HEENT: Normal, moist mucous membranes NECK: No JVD CARDIAC: regular rhythm, normal S1 and S2, no rubs or gallops. No murmur. VASCULAR: Radial pulses 2+ bilaterally. No carotid bruits RESPIRATORY:  Clear to auscultation without rales, wheezing or rhonchi  ABDOMEN: Soft, non-tender, non-distended MUSCULOSKELETAL:  Ambulates independently SKIN: Bilateral LE wrapped, but both smaller caliber than prior visit, less edema on visible portions of legs. NEUROLOGIC:  Alert and oriented x 3. No focal neuro deficits noted. PSYCHIATRIC:  Normal affect   ASSESSMENT:    1. Medication management   2. Encounter to discuss test results   3. NICM (nonischemic cardiomyopathy) ([place])   4. Chronic systolic congestive heart failure ([place])   5. Essential hypertension   6. Paroxysmal atrial fibrillation (HCC)   7. History of 2019 novel coronavirus disease (COVID-19)   8. Bilateral leg edema    PLAN:    Nonischemic cardiomyopathy, with chronic systolic and diastolic heart failure:  -euvolemic, NYHA class I -had planned for [facility], then had asymptomatic covid positive test, then renal failure. Lexiscan done, no reversible ischemia. -tolerating 6.25 mg BID carvedilol. No significant bradycardia that he notes -tolerating hydralazine 50 mg BID, imdur 30 mg daily.  -he has tolerated  losartan and spironolactone, concern was with his unclear renal failure episode earlier this year -doing well on current dose of furosemide -will order BMET today, consider entresto gentle titration if stable at follow up in 3 mos. He does not want to try this yet as he is nervous re: prior kidney failure -No cMRI ordered previously given renal issues. Discussed doing this vs. Repeat echo. He would prefer to repeat echo, but if EF remains depressed post GDMT, would re-discuss MRI.  History of atrial fibrillation and atrial flutter: CHADSVASC=3 S/p CTI ablation 2012 Has OSA, encouraged adherence to CPAP Continue amiodarone. Levothyroxine added during recent hospitalization Continue dabigatran Ablation aborted due to thrombus in LAA.  Baseline conduction disease Follow LFTs/PFTs/TFTs on amiodarone. Ideally would like to avoid long term amiodarone  Hypertension: Goal <130/80, elevated today. He will continue to monitor. Would like to titrate meds, as above  Cardiac risk counseling and prevention recommendations: -recommend heart healthy/Mediterranean diet, with whole grains, fruits, vegetable, fish, lean meats, nuts, and olive oil. Limit salt. -recommend moderate walking, 3-5 times/week for 30-50 minutes each session. Aim for at least 150 minutes.week. Goal should be pace of 3 miles/hours, or walking 1.5 miles in 30 minutes -recommend avoidance of tobacco products. Avoid excess alcohol. -continue rosuvastatin given diabetes, ASCVD risk. SGLT2i or GLP1RA may be beneficial in the future, though with prior renal failure may prefer GLP1RA. -ASCVD risk score: The 10-year ASCVD risk score Mikey Bussing DC Brooke Bonito., et al., 2013) is: 20.9%   Values used to calculate the score:     Age: 1 years     Sex: Male     Is Non-Hispanic African American: No     Diabetic: Yes     Tobacco smoker: No     Systolic Blood Pressure: 123456 mmHg     Is BP treated: Yes     HDL Cholesterol: 64.1 mg/dL     Total Cholesterol:  136 mg/dL    Plan for follow up: 3 mos or sooner PRN  Medication Adjustments/Labs and Tests Ordered: Current medicines are reviewed at length with the patient today.  Concerns regarding medicines are outlined above.  Orders Placed This Encounter  Procedures  .  Basic metabolic panel   No orders of the defined types were placed in this encounter.   Patient Instructions  Medication Instructions:  Your Physician recommend you continue on your current medication as directed.    *If you need a refill on your cardiac medications before your next appointment, please call your pharmacy*  Lab Work: Your physician recommends that you return for lab work today (BMP)  If you have labs (blood work) drawn today and your tests are completely normal, you will receive your results only by: [name] MyChart Message (if you have MyChart) OR . A paper copy in the mail If you have any lab test that is abnormal or we need to change your treatment, we will call you to review the results.  Testing/Procedures: None  Follow-Up: At [facility], you and your health needs are our priority.  As part of our continuing mission to provide you with exceptional heart care, we have created designated Provider Care Teams.  These Care Teams include your primary Cardiologist (physician) and Advanced Practice Providers (APPs -  Physician Assistants and Nurse Practitioners) who all work together to provide you with the care you need, when you need it.  Your next appointment:   3 month(s)  The format for your next appointment:   In Person  Provider:   Buford Dresser, MD     Signed, [name] Dresser, MD PhD 06/07/2019  [place]

## 2019-06-08 ENCOUNTER — Other Ambulatory Visit: Payer: Self-pay | Admitting: Physician Assistant

## 2019-06-08 ENCOUNTER — Encounter (HOSPITAL_BASED_OUTPATIENT_CLINIC_OR_DEPARTMENT_OTHER): Payer: BLUE CROSS/BLUE SHIELD | Admitting: Physician Assistant

## 2019-06-08 ENCOUNTER — Other Ambulatory Visit: Payer: Self-pay | Admitting: Internal Medicine

## 2019-06-08 DIAGNOSIS — E1151 Type 2 diabetes mellitus with diabetic peripheral angiopathy without gangrene: Secondary | ICD-10-CM | POA: Diagnosis not present

## 2019-06-08 DIAGNOSIS — Z7901 Long term (current) use of anticoagulants: Secondary | ICD-10-CM | POA: Diagnosis not present

## 2019-06-08 DIAGNOSIS — M5412 Radiculopathy, cervical region: Secondary | ICD-10-CM | POA: Diagnosis not present

## 2019-06-08 DIAGNOSIS — Z8619 Personal history of other infectious and parasitic diseases: Secondary | ICD-10-CM | POA: Diagnosis not present

## 2019-06-08 DIAGNOSIS — J45909 Unspecified asthma, uncomplicated: Secondary | ICD-10-CM | POA: Diagnosis not present

## 2019-06-08 DIAGNOSIS — L97212 Non-pressure chronic ulcer of right calf with fat layer exposed: Secondary | ICD-10-CM | POA: Diagnosis not present

## 2019-06-08 DIAGNOSIS — I5022 Chronic systolic (congestive) heart failure: Secondary | ICD-10-CM | POA: Diagnosis not present

## 2019-06-08 DIAGNOSIS — G4730 Sleep apnea, unspecified: Secondary | ICD-10-CM | POA: Diagnosis not present

## 2019-06-08 DIAGNOSIS — I429 Cardiomyopathy, unspecified: Secondary | ICD-10-CM | POA: Diagnosis not present

## 2019-06-08 DIAGNOSIS — E11621 Type 2 diabetes mellitus with foot ulcer: Secondary | ICD-10-CM | POA: Diagnosis not present

## 2019-06-08 DIAGNOSIS — E875 Hyperkalemia: Secondary | ICD-10-CM | POA: Diagnosis not present

## 2019-06-08 DIAGNOSIS — I110 Hypertensive heart disease with heart failure: Secondary | ICD-10-CM | POA: Diagnosis not present

## 2019-06-08 DIAGNOSIS — E11622 Type 2 diabetes mellitus with other skin ulcer: Secondary | ICD-10-CM | POA: Diagnosis not present

## 2019-06-08 DIAGNOSIS — I4891 Unspecified atrial fibrillation: Secondary | ICD-10-CM | POA: Diagnosis not present

## 2019-06-08 DIAGNOSIS — L03115 Cellulitis of right lower limb: Secondary | ICD-10-CM | POA: Diagnosis not present

## 2019-06-08 LAB — BASIC METABOLIC PANEL
BUN/Creatinine Ratio: 15 (ref 10–24)
BUN: 26 mg/dL (ref 8–27)
CO2: 20 mmol/L (ref 20–29)
Calcium: 9.7 mg/dL (ref 8.6–10.2)
Chloride: 99 mmol/L (ref 96–106)
Creatinine, Ser: 1.71 mg/dL — ABNORMAL HIGH (ref 0.76–1.27)
GFR calc Af Amer: 48 mL/min/{1.73_m2} — ABNORMAL LOW (ref >59)
GFR calc non Af Amer: 41 mL/min/{1.73_m2} — ABNORMAL LOW (ref >59)
Glucose: 175 mg/dL — ABNORMAL HIGH (ref 65–99)
Potassium: 4.6 mmol/L (ref 3.5–5.2)
Sodium: 140 mmol/L (ref 134–144)

## 2019-06-09 ENCOUNTER — Encounter: Payer: Self-pay | Admitting: Cardiology

## 2019-06-13 NOTE — Progress Notes (Signed)
[name], [name] ([id number]) Visit Report for 06/08/2019 Arrival Information Details Patient Name: Date of Service: [name], [name] 06/08/2019 1:30 PM Medical Record [id number] Patient Account Number: [id number] Date of Birth/Sex: Treating RN: 1955/09/04 (63 y.o. [name] Primary Care Heidi Maclin: [name] Other Clinician: Referring Malyn Aytes: Treating Reinhold Rickey/Extender:Stone III, Alla German, [name] in Treatment: 95 Visit Information History Since Last Visit Added or deleted any medications: No Patient Arrived: Ambulatory Any new allergies or adverse reactions: No Arrival Time: 13:26 Had a fall or experienced change in No Accompanied By: alone activities of daily living that may affect Transfer Assistance: None risk of falls: Patient Identification Verified: Yes Signs or symptoms of abuse/neglect since last No Secondary Verification Process Yes visito Completed: Hospitalized since last visit: No Patient Requires Transmission- No Implantable device outside of the clinic excluding No Based Precautions: cellular tissue based products placed in the center Patient Has Alerts: Yes Patient on Blood since last visit: Patient Alerts: Has Dressing in Place as Prescribed: Yes Thinner Has Compression in Place as Prescribed: Yes xarelto R TBI = .86, L TBI Pain Present Now: No = .87 Electronic Signature(s) Signed: 06/13/2019 6:10:06 PM By: Levan Hurst RN, BSN Entered By: Levan Hurst on 06/08/2019 13:27:03 -------------------------------------------------------------------------------- Compression Therapy Details Patient Name: Date of Service: [name] 06/08/2019 1:30 PM Medical Record [id number] Patient Account Number: [id number] Date of Birth/Sex: Treating RN: May 22, 1956 (63 y.o. [name] Primary Care Aimee Timmons: [name] Other Clinician: Referring Colby Catanese: Treating Gennesis Hogland/Extender:Stone III,  Alla German, [name] in Treatment: 44 Compression Therapy Performed for Wound Wound #6 Right,Proximal,Posterior Lower Leg Assessment: Performed By: Clinician Levan Hurst, RN Compression Type: Three Hydrologist) Signed: 06/13/2019 6:10:06 PM By: Levan Hurst RN, BSN Entered By: Levan Hurst on 06/08/2019 15:29:19 -------------------------------------------------------------------------------- Compression Therapy Details Patient Name: Date of Service: [name] 06/08/2019 1:30 PM Medical Record [id number] Patient Account Number: [id number] Date of Birth/Sex: Treating RN: [birth year] (63 y.o. [name] Primary Care Durga Saldarriaga: [name] Other Clinician: Referring Takaya Hyslop: Treating Nandan Willems/Extender:Stone III, Alla German, [name] in Treatment: 44 Compression Therapy Performed for Wound Wound #8 Left,Anterior Lower Leg Assessment: Performed By: Clinician Levan Hurst, RN Compression Type: Three Hydrologist) Signed: 06/13/2019 6:10:06 PM By: Levan Hurst RN, BSN Entered By: Levan Hurst on 06/08/2019 15:29:19 -------------------------------------------------------------------------------- Encounter Discharge Information Details Patient Name: Date of Service: [name] 06/08/2019 1:30 PM Medical Record [id number] Patient Account Number: [id number] Date of Birth/Sex: Treating RN: 08-27-1955 (63 y.o. [name] Primary Care Danali Marinos: [name] Other Clinician: Referring Sostenes Kauffmann: Treating Demyan Fugate/Extender:Stone III, Alla German, [name] in Treatment: 49 Encounter Discharge Information Items Discharge Condition: Stable Ambulatory Status: Ambulatory Discharge Destination: Home Transportation: Private Auto Accompanied By: alone Schedule Follow-up Appointment: Yes Clinical Summary of Care: Patient Declined Electronic Signature(s) Signed:  06/13/2019 6:10:06 PM By: Levan Hurst RN, BSN Entered By: Levan Hurst on 06/08/2019 15:30:14 -------------------------------------------------------------------------------- Patient/Caregiver Education Details Patient Name: [name] 12/23/2020andnbsp1:30 Date of Service: PM Medical Record [id number] Number: Patient Account Number: [id number] Treating RN: 28-Jul-1955 (63 y.o. Levan Hurst Date of Birth/Gender: M) Other Clinician: Primary Care Physician: [name] Treating Worthy Keeler Referring Physician: Physician/Extender: Elicia Lamp in Treatment: 8 Education Assessment Education Provided To: Patient Education Topics Provided Wound/Skin Impairment: Methods: Explain/Verbal Responses: State content correctly [employer]) Signed: 06/13/2019 6:10:06 PM By: Levan Hurst RN, BSN Entered By: Levan Hurst on 06/08/2019 15:30:03 -------------------------------------------------------------------------------- Wound Assessment Details Patient Name: Date of Service: [name]. 06/08/2019 1:30 PM Medical Record  [id number] Patient Account Number: [id number] Date of Birth/Sex: Treating RN: 12-18-1955 (63 y.o. [name] Primary Care Tariana Moldovan: [name] Other Clinician: Referring Sir Mallis: Treating Tre Sanker/Extender:Stone III, Alla German, [name] in Treatment: 44 Wound Status Wound Number: 6 Primary Venous Leg Ulcer Etiology: Wound Location: Right Lower Leg - Posterior, Proximal Wound Open Status: Wounding Event: Gradually Appeared Comorbid Asthma, Sleep Apnea, Arrhythmia, Date Acquired: 01/06/2019 History: Congestive Heart Failure, Hypertension, Type Weeks Of Treatment: 21 II Diabetes Clustered Wound: No Wound Measurements Length: (cm) 0.7 % Reduct Width: (cm) 0.5 % Reduct Depth: (cm) 0.1 Epitheli Area: (cm) 0.275 Tunneli Volume: (cm) 0.027 Undermi Wound  Description Classification: Full Thickness Without Exposed Support Foul Od Structures Slough/ Wound Flat and Intact Margin: Exudate Small Amount: Exudate Serosanguineous Type: Exudate red, brown Color: Wound Bed Granulation Amount: Large (67-100%) Granulation Quality: Red Fascia Necrotic Amount: None Present (0%) Fat Lay Tendon Muscle Joint E Bone Ex or After Cleansing: No Fibrino No Exposed Structure Exposed: No er (Subcutaneous Tissue) Exposed: Yes Exposed: No Exposed: No xposed: No posed: No ion in Area: 30% ion in Volume: 30.8% alization: Large (67-100%) ng: No ning: No Treatment Notes Wound #6 (Right, Proximal, Posterior Lower Leg) 1. Cleanse With Soap and water 2. Periwound Care Moisturizing lotion 3. Primary Dressing Applied Calcium Alginate Ag 4. Secondary Dressing Foam 6. Support Layer Applied 3 layer compression Water quality scientist) Signed: 06/13/2019 6:10:06 PM By: Levan Hurst RN, BSN Entered By: Levan Hurst on 06/08/2019 15:28:29 -------------------------------------------------------------------------------- Wound Assessment Details Patient Name: Date of Service: [name] 06/08/2019 1:30 PM Medical Record [id number] Patient Account Number: [id number] Date of Birth/Sex: Treating RN: 09/03/1955 (63 y.o. [name] Primary Care Merridy Pascoe: [name] Other Clinician: Referring Vicente Weidler: Treating Thompson Mckim/Extender:Stone III, Alla German, [name] in Treatment: 44 Wound Status Wound Number: 8 Primary Venous Leg Ulcer Etiology: Wound Location: Left Lower Leg - Anterior Wound Open Wounding Event: Gradually Appeared Status: Date Acquired: 02/14/2019 Comorbid Asthma, Sleep Apnea, Arrhythmia, Weeks Of Treatment: 16 History: Congestive Heart Failure, Hypertension, Type Clustered Wound: No II Diabetes Wound Measurements Length: (cm) 1.3 % Reduct Width: (cm) 0.5 % Reduct Depth: (cm) 0.1  Epitheli Area: (cm) 0.511 Tunneli Volume: (cm) 0.051 Undermi Wound Description Full Thickness Without Exposed Support Foul Od Classification: Structures Slough/ Wound Flat and Intact Margin: Exudate Small Amount: Exudate Serosanguineous Type: Exudate red, brown Color: Wound Bed Granulation Amount: Large (67-100%) Granulation Quality: Red Fascia E Necrotic Amount: None Present (0%) Fat Laye Tendon E Muscle E Joint Ex Bone Exp or After Cleansing: No Fibrino No Exposed Structure xposed: No r (Subcutaneous Tissue) Exposed: Yes xposed: No xposed: No posed: No osed: No ion in Area: 91.9% ion in Volume: 91.9% alization: Medium (34-66%) ng: No ning: No Treatment Notes Wound #8 (Left, Anterior Lower Leg) 1. Cleanse With Soap and water 2. Periwound Care Moisturizing lotion 3. Primary Dressing Applied Calcium Alginate Ag 4. Secondary Dressing Foam 6. Support Layer Applied 3 layer compression Water quality scientist) Signed: 06/13/2019 6:10:06 PM By: Levan Hurst RN, BSN Entered By: Levan Hurst on 06/08/2019 15:29:07 -------------------------------------------------------------------------------- [place] Details Patient Name: Date of Service: [name]. 06/08/2019 1:30 PM Medical Record [id number] Patient Account Number: [id number] Date of Birth/Sex: Treating RN: 05/28/1956 (63 y.o. [name] Primary Care Corley Maffeo: [name] Other Clinician: Referring Langford Carias: Treating Breanna Mcdaniel/Extender:Stone III, Alla German, [name] in Treatment: 44 Vital Signs Time Taken: 13:27 Temperature (F): 98.4 Height (in): 73 Pulse (bpm): 58 Weight (lbs): 290 Respiratory Rate (breaths/min): 18 Body Mass Index (BMI):  38.3 Blood Pressure (mmHg): 158/84 Reference Range: 80 - 120 mg / dl Electronic Signature(s) Signed: 06/13/2019 6:10:06 PM By: Levan Hurst RN, BSN Entered By: Levan Hurst on 06/08/2019 13:27:23

## 2019-06-13 NOTE — Progress Notes (Signed)
ARTIST, ZLOTNICK ([id number]) Visit Report for 06/08/2019 SuperBill Details Patient Name: Date of Service: CORTLANDT, KOSKY 06/08/2019 Medical Record [id number] Patient Account Number: [id number] Date of Birth/Sex: Treating RN: 09-Mar-1956 (63 y.o. [name] Primary Care Provider: [name] Other Clinician: Referring Provider: Treating Provider/Extender:Stone III, Alla German, [name] in Treatment: 44 Diagnosis Coding ICD-10 Codes Code Description [phone] Non-pressure chronic ulcer of right calf with fat layer exposed E11.622 Type 2 diabetes mellitus with other skin ulcer I87.321 Chronic venous hypertension (idiopathic) with inflammation of right lower extremity S81.811D Laceration without foreign body, right lower leg, subsequent encounter S81.812D Laceration without foreign body, left lower leg, subsequent encounter L03.115 Cellulitis of right lower limb Facility Procedures CPT4 Description Modifier Quantity Code [id number] BILATERAL: Application of multi-layer venous compression system; leg 1 (below knee), including ankle and foot. Electronic Signature(s) Signed: 06/08/2019 4:54:22 PM By: Worthy Keeler PA-C Signed: 06/13/2019 6:10:06 PM By: Levan Hurst RN, BSN Entered By: Levan Hurst on 06/08/2019 15:30:23

## 2019-06-16 ENCOUNTER — Encounter (HOSPITAL_BASED_OUTPATIENT_CLINIC_OR_DEPARTMENT_OTHER): Payer: BLUE CROSS/BLUE SHIELD | Admitting: Internal Medicine

## 2019-06-16 ENCOUNTER — Other Ambulatory Visit: Payer: Self-pay

## 2019-06-16 DIAGNOSIS — Z7901 Long term (current) use of anticoagulants: Secondary | ICD-10-CM | POA: Diagnosis not present

## 2019-06-16 DIAGNOSIS — I5022 Chronic systolic (congestive) heart failure: Secondary | ICD-10-CM | POA: Diagnosis not present

## 2019-06-16 DIAGNOSIS — E1151 Type 2 diabetes mellitus with diabetic peripheral angiopathy without gangrene: Secondary | ICD-10-CM | POA: Diagnosis not present

## 2019-06-16 DIAGNOSIS — L03115 Cellulitis of right lower limb: Secondary | ICD-10-CM | POA: Diagnosis not present

## 2019-06-16 DIAGNOSIS — Z8619 Personal history of other infectious and parasitic diseases: Secondary | ICD-10-CM | POA: Diagnosis not present

## 2019-06-16 DIAGNOSIS — I4891 Unspecified atrial fibrillation: Secondary | ICD-10-CM | POA: Diagnosis not present

## 2019-06-16 DIAGNOSIS — E875 Hyperkalemia: Secondary | ICD-10-CM | POA: Diagnosis not present

## 2019-06-16 DIAGNOSIS — S81802A Unspecified open wound, left lower leg, initial encounter: Secondary | ICD-10-CM | POA: Diagnosis not present

## 2019-06-16 DIAGNOSIS — I429 Cardiomyopathy, unspecified: Secondary | ICD-10-CM | POA: Diagnosis not present

## 2019-06-16 DIAGNOSIS — L97212 Non-pressure chronic ulcer of right calf with fat layer exposed: Secondary | ICD-10-CM | POA: Diagnosis not present

## 2019-06-16 DIAGNOSIS — M5412 Radiculopathy, cervical region: Secondary | ICD-10-CM | POA: Diagnosis not present

## 2019-06-16 DIAGNOSIS — E11621 Type 2 diabetes mellitus with foot ulcer: Secondary | ICD-10-CM | POA: Diagnosis not present

## 2019-06-16 DIAGNOSIS — I87311 Chronic venous hypertension (idiopathic) with ulcer of right lower extremity: Secondary | ICD-10-CM | POA: Diagnosis not present

## 2019-06-16 DIAGNOSIS — J45909 Unspecified asthma, uncomplicated: Secondary | ICD-10-CM | POA: Diagnosis not present

## 2019-06-16 DIAGNOSIS — G4730 Sleep apnea, unspecified: Secondary | ICD-10-CM | POA: Diagnosis not present

## 2019-06-16 DIAGNOSIS — E11622 Type 2 diabetes mellitus with other skin ulcer: Secondary | ICD-10-CM | POA: Diagnosis not present

## 2019-06-16 DIAGNOSIS — I110 Hypertensive heart disease with heart failure: Secondary | ICD-10-CM | POA: Diagnosis not present

## 2019-06-16 NOTE — Progress Notes (Signed)
[name], [name] ([name]) Visit Report for 06/16/2019 HPI Details Patient Name: Date of Service: [name], [name] 06/16/2019 12:45 PM Medical Record [name] Patient Account Number: [id number] Date of Birth/Sex: Treating RN: May 01, 1956 (63 y.o. M) Primary Care Provider: [name] Other Clinician: Referring Provider: Treating Provider/Extender:Lavaya Defreitas, [name], [name] in Treatment: 46 History of Present Illness HPI Description: ADMISSION 07/29/2018 [name] is a 63 year old man with either prediabetes or diabetes he is on glipizide. In late October to November 2019 he noted a scabbed area on the back of his right calf. He picked this off a few times but it would not heal. He saw his primary physician on 12/5. It was felt at that time that this may actually heal on its own with conservative management. The next visit was on 07/20/2018 noted the area was a lot worse and arranged for his treatment here. He has been topical antibiotics like Neosporin although he stopped using this when the wound looked worse. He is just been covering this with a clean Band-Aid. He is given Bactrim a week ago and he is finishing this currently. It is made some improvement in the surrounding erythema per the patient. He does not have a history of nonhealing wounds. No prior history of wounds on his legs that he had difficulty healing. No prior skin issues. 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 Past medical history; A. fib status post ablation and recent cardioversion he is on Xarelto. He has a history of systolic heart failure, cervical radiculopathy, cardiomyopathy and type 2 diabetes as discussed ABI in the right leg was noncompressible on the right 2/20; the biopsy I did on the patient last week was negative for malignancy. Culture grew Pseudomonas and methicillin sensitive staph aureus. He is on cefdinir 300 twice a day. I will have to make this a 10  day course. He put him in compression. He states that the redness pain and erythema are a lot better. I suspect the patient has chronic venous insufficiency probably with a secondary cellulitis 2/27; arrives today with copious amounts of drainage coming out of the wound irritating the skin below the wound area. He only renewed the final 3 days of cefdinir today 3/5; came in for a nurse change 3 days ago. Again a lot of drainage noted. Zinc oxide was applied unfortunately the drainage appears to a pool then he has a string of superficial open areas extending down into the Achilles area and some just below the wound. The wound itself does not look too bad. He is completed the antibiotics although apparently there was separation from the original 7 with the last 3 days. Culture grew a few MSSA and a few Pseudomonas 3/12; not too much difference over the last week. He came in for a dressing change on Monday by our nursing staff. Necrotic debris again over the wound surface. The entire area looks irritated but nontender and I do not think shows obvious evidence of infection. We have not heard anything about the reflux studies. Also notable than in this diabetic man we had noncompressible vessels and although I can feel his pulses easily in his feet I will order arterial studies as well. 3/19-Patient had experience more pain has been dressed with a silver alginate, the wounds all have necrotic debris, very friable with easy bleeding with any kind of surface debridement including with gauze and Anasept and with a #3 curette. His vascular appointments unfortunately were all canceled on account of  the virus outbreak resulting in studies only being done for emergent cases. His pulses are easily palpable in the lower extremity. He has open areas below the primary wound where he states the drainage which included purulent material also with some blood pooled and caused breakdown. 3/26; the patient was  changed to Santyl with [facility] backing last week. This was largely because the silver alginate was sticking to the wound. He is still having quite a bit of pain. He tells [place] that the reflux studies and arterial studies we had attempted to arrange through vein and vascular are not being booked until mid May. Culture that was done last week showed both Serratia moderate and a few methicillin sensitive staph aureus I started him on Bactrim DS 1 p.o. twice daily on 3/23 for 7 days. He is here for follow-up. 4/3; patient is on Santyl with Hydrofera Blue. Patient complains of pain. Our intake nurse is noted drainage and some odor. He has completed Bactrim recently for Serratia and a few methicillin sensitive staph aureus. After our staff push hard last week we were able to get venous studies as well as arterial studies. His arterial studies showed on the right great toe pressure of 0.86. On the left ABI at 1.47 TBI of 0.87. On both sides waveforms were triphasic VENOUS STUDIES showed reflux in the femoral vein, popliteal vein great saphenous vein at the saphenofemoral junction and great saphenous vein at the proximal thigh. Also noted to have age-indeterminate superficial vein thrombosis involving the small saphenous vein. Notable that the patient is already on Xarelto for atrial fibrillation. 4/9; the patient has been to see vascular surgery and reviewed by Dr. [name]. He was felt to have only minimal superficial venous reflux in the right great greater saphenous vein. For this reason he was not felt to be a candidate for laser ablation of the right greater saphenous vein. A wedge cushion was suggested to keep his leg elevated. He does have deep venous reflux on the right. Culture I did last time showed a combination of Serratia Pseudomonas and methicillin sensitive staph aureus. We have been using Hydrofera Blue to the large wound, Santyl to some of the deep punched out satellite lesions distal  to it. There is some improvement [id number]; the application for Apligraf was put out for further review for material that we have resubmitted. He has the deep area on the right posterior calf which is large and then 3 small punched out areas beneath this. In general his wounds look a lot better 4/23; the patient has his large original wound and then 2 punched out satellite lesions below it on the right posterior calf. I was usually able to use Apligraf #1 to cover the full surface area of the larger wound. We continued with Santyl and Hydrofera Blue on the 2 punched-out areas 5/7; patient has his large original wound and 2 punched out satellite lesions below it in the right posterior calf. Apligraf #2 today. Major improvement in the big wound in terms of wound depth. Punched-out [place] wounds have a very healthy looking wound bed. 5/21-Patient comes back for his large right posterior calf wound for which she has been an Apligraf #3 today. Wound depth seems to be better, the punched-out wounds appear to have healthy bed with some bleeding 6/4; right posterior calf wound is much better. Apligraf #4 today. The major wound has no wound depth at all. Only 1 of the satellite lesions is still open. The patient has very  high blood pressure coming in today with a diastolic blood pressure of 130 after lying there for a few minutes it was down to 110. He states that is running [id number] diastolic at home. He was high last week as well I thought he was on 50 mg 1/2 tablet twice daily of losartan I told him to double up on that however it turns out he is actually on 50 twice daily. Course he is run out of his medications prematurely. He has a follow-up with his primary's office next Wednesday. 6/18; patient's wounds continue to improve. The small area laterally is just about closed and there is continued epithelialization on the area on the posterior calf. 7/2; we applied his last Apligraf last visit. Early the  next week there was a lot of purulent looking drainage that I cultured this grew staph and Pseudomonas however when we had him back for the next visit to 3 days later everything looked a lot better. He did not receive systemic antibiotics. Fortunately we did not have to remove the Apligraf. He has had heart trouble this week he was having an ablation apparently they have discovered a clot in his left atrium they have changed all his anticoagulants as well as his blood pressure medication. 7/16; using Hydrofera Blue. We are making nice progress towards closure. He has not yet ordered his compression stockings he has his measurements 7/23; dimensions are better. He has a new satellite area superiorly. Both wounds cauterized with silver nitrate. 7/30; absolutely no change in the major wound on the posterior part of the right calf. We have been using Hydrofera Blue for a prolonged period of time and really had a nice improvement but over the last few weeks this is stalled. There is no depth. He arrived in clinic today with a new area on the right anterior tibia which apparently was an "ingrown hair". It is clearly an open area. This looks like a wrap injury although he was not really aware of it. 8/31; since the patient was last in clinic he was admitted to hospital from 8/17 through 8/22. He had had multiple falls at home including one off a ladder. He was admitted to hospital with mild COVID-19 viral pneumonia. Noted to be hyperkalemic and in acute renal failure. He has chronic systolic heart failure with ejection fraction of 35%. He arrives back in clinic with the original wound on the posterior aspect of the right lower calf looking a lot better. He has eschared areas from falls and lacerations on his anterior knees bilaterally just below the patella and on the mid part of his tibia bilaterally. He seems to have a small area on the right lateral ankle as well 9/10- Patient comes to clinic with a  2 new wounds one on the left anterior shin and one on the right posterior leg both the left knee and right knee areas are healed up. We have been using Santyl to the left anterior leg and silver alginate to right posterior leg wounds. 9/17; the patient is original wound looks good on the posterior right calf. He has traumatic areas on the left anterior shin left anterior patellar tendon and the right mid tibia area. Except for the right posterior calf all required debridement. We have been using Santyl on these areas and silver alginate posteriorly right calf 9/24; the original wound on the posterior calf on the right looks good. This is healthy. There are traumatic wounds in the left anterior shin, left anterior  patella in the right mid tibia area are about the same. He has dusky erythema on the left anterior tibia which led me to give him antibiotics last week. This does not look too much different. This is nontender and I wonder if this is all just venous inflammation. 10/2; the patient has 2 open areas on the right posterior calf both of these look good we have been using silver alginate. He has traumatic wounds on the left and right mid tibia areas. The surface of these wounds is still not ready for closure. We have been using Iodoflex. Finally has areas on the left knee. The latter wounds are all traumatic after a fall 10/9. His original wounds of the right posterior calf are superficial and progressing towards closure. His traumatic wounds on the left anterior tibia and right anterior tibia are considerably improved in terms of the wound surface. He asked me about a skin graft in these areas I do not think this is going to be necessary. Change from Iodosorb to [facility]. The area on the left knee is just about closed as well 10/16; his original wound on the right posterior calf very superficial looks healthy. He has more recent traumatic wounds on the left anterior tibia and right  anterior tibia both of these have better looking surfaces and measuring smaller. Might be beneficial for an advanced treatment product. The area on his left knee has a small superficial scab and we are going to close that when 10/23; his original wound areas on the right posterior calf continue to improve. The more recently traumatic wounds on the left anterior tibia and right anterior tibia both look better in terms of surfaces. No debridement required. We have been using Hydrofera Blue. He is approved for Apligraf 11/3; Apligraf #1 applied to the left anterior tibia right anterior tibia and to 2 small areas remaining on the right posterior calf which were part of his initial wound area. 11/19; Apligraf #2. Left anterior tibia is healed. Right anterior tibia much better. Only one small area remains on the right posterior calf which was part of his initial wound area 12/3; the left anterior tibia has opened up again. Hyper granulated nodule. Right anterior tibia is superficial and larger. There is no depth of this. I did not place Apligraf today return to [facility] under compression 12/10; the patient has 2 remaining wounds 1 on the right posterior and the other on the left anterior. Both of these look quite good. No debridement is required we have been using Hydrofera Blue under compression. 12/17; the patient has really not closed over which is disappointing. His wraps were very tight and there may be some wrap injury issues. On the left he has the original wound on the medial mid tibia he has a wrap injury on the lateral mid tibia. On the right his posterior areas are superficial but certainly not closed 12/31; the patient has totally closed on the left. He will transition to his own 20/30 below-knee stocking on the left leg today. Interestingly on the right posterior calf the 2 wounds that he had from last time of closed however there is an additional injury in this same area. Almost  looks as though there is subcutaneous fluid/blistering. There is no evidence of cellulitis this does not seem tender Electronic Signature(s) Signed: 06/16/2019 2:35:12 PM By: Linton Ham MD Entered By: Linton Ham on 06/16/2019 13:58:29 -------------------------------------------------------------------------------- Physical Exam Details Patient Name: Date of Service: [name] 06/16/2019 12:45  PM Medical Record [id number] Patient Account Number: [id number] Date of Birth/Sex: Treating RN: 1956/04/18 (63 y.o. M) Primary Care Provider: [name] Other Clinician: Referring Provider: Treating Provider/Extender:Lowry Bala, [name], [name] in Treatment: 46 Constitutional Patient is hypertensive.. Pulse regular and within target range for patient.[name] Respirations regular, non-labored and within target range.. Temperature is normal and within the target range for the patient.[name] Appears in no distress. Respiratory work of breathing is normal. Cardiovascular Pedal pulses are palpable. Edema control is adequate. Integumentary (Hair, Skin) No clear evidence of cellulitis/periwound tenderness. Psychiatric appears at normal baseline. Notes Wound exam; right anterior mid tibia is closed. The left medial tibia wound is closed. The only open areas on the right posterior lower calf. Even then I believe that the original 2 wounds from last week which were close to each other have closed however I think he is opened another area in the same area. There is some subcutaneous fluid/blistering however it is minor. No evidence of cellulitis. Electronic Signature(s) Signed: 06/16/2019 2:35:12 PM By: Linton Ham MD Entered By: Linton Ham on 06/16/2019 14:00:06 -------------------------------------------------------------------------------- Physician Orders Details Patient Name: Date of Service: KEESHAWN, WIKER 06/16/2019 12:45 PM Medical Record  [id number] Patient Account Number: [id number] Date of Birth/Sex: Treating RN: Jun 04, 1956 (63 y.o. [name], Tammi Klippel Primary Care Provider: [name] Other Clinician: Referring Provider: Treating Provider/Extender:Jayliani Wanner, [name], [name] in Treatment: [phone] Verbal / Phone Orders: No Diagnosis Coding ICD-10 Coding Code Description [phone] Non-pressure chronic ulcer of right calf with fat layer exposed E11.622 Type 2 diabetes mellitus with other skin ulcer I87.321 Chronic venous hypertension (idiopathic) with inflammation of right lower extremity S81.811D Laceration without foreign body, right lower leg, subsequent encounter S81.812D Laceration without foreign body, left lower leg, subsequent encounter L03.115 Cellulitis of right lower limb Follow-up Appointments Return Appointment in 1 week. - Thursday Dressing Change Frequency Do not change entire dressing for one week. Skin Barriers/Peri-Wound Care Moisturizing lotion - both legs patient to lotion daily at night. Wound Cleansing May shower with protection. Primary Wound Dressing Wound #6 Right,Proximal,Posterior Lower Leg Calcium Alginate with Silver Secondary Dressing Wound #6 Right,Proximal,Posterior Lower Leg Foam - apply over wound. apply over lower scarred area. apply foam for protection to anterior portion of lower leg. Edema Control 3 Layer Compression System - Right Lower Extremity Patient to wear own compression stockings - patient to bring in on next wound care appointment. Avoid standing for long periods of time Elevate legs to the level of the heart or above for 30 minutes daily and/or when sitting, a frequency of: - 3-4 times a day. Exercise regularly Support Garment 20-30 mm/Hg pressure to: - patient to apply compression stockings on left leg in the morning and remove at night. Electronic Signature(s) Signed: 06/16/2019 2:35:12 PM By: Linton Ham MD Signed: 06/16/2019 2:55:42  PM By: Deon Pilling Entered By: Deon Pilling on 06/16/2019 13:38:21 -------------------------------------------------------------------------------- Problem List Details Patient Name: Date of Service: [place], ART 06/16/2019 12:45 PM Medical Record [id number] Patient Account Number: [id number] Date of Birth/Sex: Treating RN: Mar 10, 1956 (63 y.o. [name] Primary Care Provider: [name] Other Clinician: Referring Provider: Treating Provider/Extender:Jacci Ruberg, [name], [name] in Treatment: 46 Active Problems ICD-10 Evaluated Encounter Code Description Active Date Today Diagnosis L97.212 Non-pressure chronic ulcer of right calf with fat layer 07/29/2018 No Yes exposed E11.622 Type 2 diabetes mellitus with other skin ulcer 07/29/2018 No Yes I87.321 Chronic venous hypertension (idiopathic) with 07/29/2018 No Yes inflammation of right lower extremity S81.811D Laceration without foreign body, right  lower leg, 02/14/2019 No Yes subsequent encounter Inactive Problems ICD-10 Code Description Active Date Inactive Date S81.812D Laceration without foreign body, left lower leg, subsequent 02/14/2019 02/14/2019 encounter L03.115 Cellulitis of right lower limb 09/24/2018 09/24/2018 Resolved Problems Electronic Signature(s) Signed: 06/16/2019 2:35:12 PM By: Linton Ham MD Entered By: Linton Ham on 06/16/2019 13:56:43 -------------------------------------------------------------------------------- Progress Note Details Patient Name: Date of Service: [name] 06/16/2019 12:45 PM Medical Record [id number] Patient Account Number: [id number] Date of Birth/Sex: Treating RN: 1955/10/14 (63 y.o. M) Primary Care Provider: [name] Other Clinician: Referring Provider: Treating Provider/Extender:Jaydan Meidinger, [name], [name] in Treatment: 46 Subjective History of Present Illness (HPI) ADMISSION 07/29/2018 Mr.  Smeby is a 63 year old man with either prediabetes or diabetes he is on glipizide. In late October to November 2019 he noted a scabbed area on the back of his right calf. He picked this off a few times but it would not heal. He saw his primary physician on 12/5. It was felt at that time that this may actually heal on its own with conservative management. The next visit was on 07/20/2018 noted the area was a lot worse and arranged for his treatment here. He has been topical antibiotics like Neosporin although he stopped using this when the wound looked worse. He is just been covering this with a clean Band-Aid. He is given Bactrim a week ago and he is finishing this currently. It is made some improvement in the surrounding erythema per the patient. He does not have a history of nonhealing wounds. No prior history of wounds on his legs that he had difficulty healing. No prior skin issues. 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 Past medical history; A. fib status post ablation and recent cardioversion he is on Xarelto. He has a history of systolic heart failure, cervical radiculopathy, cardiomyopathy and type 2 diabetes as discussed ABI in the right leg was noncompressible on the right 2/20; the biopsy I did on the patient last week was negative for malignancy. Culture grew Pseudomonas and methicillin sensitive staph aureus. He is on cefdinir 300 twice a day. I will have to make this a 10 day course. He put him in compression. He states that the redness pain and erythema are a lot better. I suspect the patient has chronic venous insufficiency probably with a secondary cellulitis 2/27; arrives today with copious amounts of drainage coming out of the wound irritating the skin below the wound area. He only renewed the final 3 days of cefdinir today 3/5; came in for a nurse change 3 days ago. Again a lot of drainage noted. Zinc oxide was applied unfortunately the drainage appears to a  pool then he has a string of superficial open areas extending down into the Achilles area and some just below the wound. The wound itself does not look too bad. He is completed the antibiotics although apparently there was separation from the original 7 with the last 3 days. Culture grew a few MSSA and a few Pseudomonas 3/12; not too much difference over the last week. He came in for a dressing change on Monday by our nursing staff. Necrotic debris again over the wound surface. The entire area looks irritated but nontender and I do not think shows obvious evidence of infection. We have not heard anything about the reflux studies. Also notable than in this diabetic man we had noncompressible vessels and although I can feel his pulses easily in his feet I will order arterial studies as  well. 3/19-Patient had experience more pain has been dressed with a silver alginate, the wounds all have necrotic debris, very friable with easy bleeding with any kind of surface debridement including with gauze and Anasept and with a #3 curette. His vascular appointments unfortunately were all canceled on account of the virus outbreak resulting in studies only being done for emergent cases. His pulses are easily palpable in the lower extremity. He has open areas below the primary wound where he states the drainage which included purulent material also with some blood pooled and caused breakdown. 3/26; the patient was changed to Santyl with [facility] backing last week. This was largely because the silver alginate was sticking to the wound. He is still having quite a bit of pain. He tells [place] that the reflux studies and arterial studies we had attempted to arrange through vein and vascular are not being booked until mid May. Culture that was done last week showed both Serratia moderate and a few methicillin sensitive staph aureus I started him on Bactrim DS 1 p.o. twice daily on 3/23 for 7 days. He is here for  follow-up. 4/3; patient is on Santyl with Hydrofera Blue. Patient complains of pain. Our intake nurse is noted drainage and some odor. He has completed Bactrim recently for Serratia and a few methicillin sensitive staph aureus. After our staff push hard last week we were able to get venous studies as well as arterial studies. His arterial studies showed on the right great toe pressure of 0.86. On the left ABI at 1.47 TBI of 0.87. On both sides waveforms were triphasic VENOUS STUDIES showed reflux in the femoral vein, popliteal vein great saphenous vein at the saphenofemoral junction and great saphenous vein at the proximal thigh. Also noted to have age-indeterminate superficial vein thrombosis involving the small saphenous vein. Notable that the patient is already on Xarelto for atrial fibrillation. 4/9; the patient has been to see vascular surgery and reviewed by Dr. [name]. He was felt to have only minimal superficial venous reflux in the right great greater saphenous vein. For this reason he was not felt to be a candidate for laser ablation of the right greater saphenous vein. A wedge cushion was suggested to keep his leg elevated. He does have deep venous reflux on the right. Culture I did last time showed a combination of Serratia Pseudomonas and methicillin sensitive staph aureus. We have been using Hydrofera Blue to the large wound, Santyl to some of the deep punched out satellite lesions distal to it. There is some improvement [id number]; the application for Apligraf was put out for further review for material that we have resubmitted. He has the deep area on the right posterior calf which is large and then 3 small punched out areas beneath this. In general his wounds look a lot better 4/23; the patient has his large original wound and then 2 punched out satellite lesions below it on the right posterior calf. I was usually able to use Apligraf #1 to cover the full surface area of the larger  wound. We continued with Santyl and Hydrofera Blue on the 2 punched-out areas 5/7; patient has his large original wound and 2 punched out satellite lesions below it in the right posterior calf. Apligraf #2 today. Major improvement in the big wound in terms of wound depth. Punched-out [place] wounds have a very healthy looking wound bed. 5/21-Patient comes back for his large right posterior calf wound for which she has been an Apligraf #3 today.  Wound depth seems to be better, the punched-out wounds appear to have healthy bed with some bleeding 6/4; right posterior calf wound is much better. Apligraf #4 today. The major wound has no wound depth at all. Only 1 of the satellite lesions is still open. The patient has very high blood pressure coming in today with a diastolic blood pressure of 130 after lying there for a few minutes it was down to 110. He states that is running [id number] diastolic at home. He was high last week as well I thought he was on 50 mg 1/2 tablet twice daily of losartan I told him to double up on that however it turns out he is actually on 50 twice daily. Course he is run out of his medications prematurely. He has a follow-up with his primary's office next Wednesday. 6/18; patient's wounds continue to improve. The small area laterally is just about closed and there is continued epithelialization on the area on the posterior calf. 7/2; we applied his last Apligraf last visit. Early the next week there was a lot of purulent looking drainage that I cultured this grew staph and Pseudomonas however when we had him back for the next visit to 3 days later everything looked a lot better. He did not receive systemic antibiotics. Fortunately we did not have to remove the Apligraf. He has had heart trouble this week he was having an ablation apparently they have discovered a clot in his left atrium they have changed all his anticoagulants as well as his blood pressure medication. 7/16;  using Hydrofera Blue. We are making nice progress towards closure. He has not yet ordered his compression stockings he has his measurements 7/23; dimensions are better. He has a new satellite area superiorly. Both wounds cauterized with silver nitrate. 7/30; absolutely no change in the major wound on the posterior part of the right calf. We have been using Hydrofera Blue for a prolonged period of time and really had a nice improvement but over the last few weeks this is stalled. There is no depth. He arrived in clinic today with a new area on the right anterior tibia which apparently was an "ingrown hair". It is clearly an open area. This looks like a wrap injury although he was not really aware of it. 8/31; since the patient was last in clinic he was admitted to hospital from 8/17 through 8/22. He had had multiple falls at home including one off a ladder. He was admitted to hospital with mild COVID-19 viral pneumonia. Noted to be hyperkalemic and in acute renal failure. He has chronic systolic heart failure with ejection fraction of 35%. He arrives back in clinic with the original wound on the posterior aspect of the right lower calf looking a lot better. He has eschared areas from falls and lacerations on his anterior knees bilaterally just below the patella and on the mid part of his tibia bilaterally. He seems to have a small area on the right lateral ankle as well 9/10- Patient comes to clinic with a 2 new wounds one on the left anterior shin and one on the right posterior leg both the left knee and right knee areas are healed up. We have been using Santyl to the left anterior leg and silver alginate to right posterior leg wounds. 9/17; the patient is original wound looks good on the posterior right calf. He has traumatic areas on the left anterior shin left anterior patellar tendon and the right mid tibia area. Except  for the right posterior calf all required debridement. We have been using  Santyl on these areas and silver alginate posteriorly right calf 9/24; the original wound on the posterior calf on the right looks good. This is healthy. There are traumatic wounds in the left anterior shin, left anterior patella in the right mid tibia area are about the same. He has dusky erythema on the left anterior tibia which led me to give him antibiotics last week. This does not look too much different. This is nontender and I wonder if this is all just venous inflammation. 10/2; the patient has 2 open areas on the right posterior calf both of these look good we have been using silver alginate. He has traumatic wounds on the left and right mid tibia areas. The surface of these wounds is still not ready for closure. We have been using Iodoflex. Finally has areas on the left knee. The latter wounds are all traumatic after a fall 10/9. His original wounds of the right posterior calf are superficial and progressing towards closure. His traumatic wounds on the left anterior tibia and right anterior tibia are considerably improved in terms of the wound surface. He asked me about a skin graft in these areas I do not think this is going to be necessary. Change from Iodosorb to [facility]. ooThe area on the left knee is just about closed as well 10/16; his original wound on the right posterior calf very superficial looks healthy. He has more recent traumatic wounds on the left anterior tibia and right anterior tibia both of these have better looking surfaces and measuring smaller. Might be beneficial for an advanced treatment product. The area on his left knee has a small superficial scab and we are going to close that when 10/23; his original wound areas on the right posterior calf continue to improve. The more recently traumatic wounds on the left anterior tibia and right anterior tibia both look better in terms of surfaces. No debridement required. We have been using Hydrofera Blue. He is  approved for Apligraf 11/3; Apligraf #1 applied to the left anterior tibia right anterior tibia and to 2 small areas remaining on the right posterior calf which were part of his initial wound area. 11/19; Apligraf #2. Left anterior tibia is healed. Right anterior tibia much better. Only one small area remains on the right posterior calf which was part of his initial wound area 12/3; the left anterior tibia has opened up again. Hyper granulated nodule. Right anterior tibia is superficial and larger. There is no depth of this. I did not place Apligraf today return to [facility] under compression 12/10; the patient has 2 remaining wounds 1 on the right posterior and the other on the left anterior. Both of these look quite good. No debridement is required we have been using Hydrofera Blue under compression. 12/17; the patient has really not closed over which is disappointing. His wraps were very tight and there may be some wrap injury issues. On the left he has the original wound on the medial mid tibia he has a wrap injury on the lateral mid tibia. On the right his posterior areas are superficial but certainly not closed 12/31; the patient has totally closed on the left. He will transition to his own 20/30 below-knee stocking on the left leg today. Interestingly on the right posterior calf the 2 wounds that he had from last time of closed however there is an additional injury in this same area. Almost  looks as though there is subcutaneous fluid/blistering. There is no evidence of cellulitis this does not seem tender Objective Constitutional Patient is hypertensive.. Pulse regular and within target range for patient.[name] Respirations regular, non-labored and within target range.. Temperature is normal and within the target range for the patient.[name] Appears in no distress. Vitals Time Taken: 1:04 PM, Height: 73 in, Source: Stated, Weight: 290 lbs, Source: Stated, BMI: 38.3, Temperature: 97.6 F,  Pulse: 52 bpm, Respiratory Rate: 18 breaths/min, Blood Pressure: 146/84 mmHg. Respiratory work of breathing is normal. Cardiovascular Pedal pulses are palpable. Edema control is adequate. Psychiatric appears at normal baseline. General Notes: Wound exam; right anterior mid tibia is closed. The left medial tibia wound is closed. ooThe only open areas on the right posterior lower calf. Even then I believe that the original 2 wounds from last week which were close to each other have closed however I think he is opened another area in the same area. There is some subcutaneous fluid/blistering however it is minor. No evidence of cellulitis. Integumentary (Hair, Skin) No clear evidence of cellulitis/periwound tenderness. Wound #6 status is Open. Original cause of wound was Gradually Appeared. The wound is located on the Right,Proximal,Posterior Lower Leg. The wound measures 2.2cm length x 0.9cm width x 0.1cm depth; 1.555cm^2 area and 0.156cm^3 volume. There is Fat Layer (Subcutaneous Tissue) Exposed exposed. There is no tunneling or undermining noted. There is a small amount of serosanguineous drainage noted. The wound margin is flat and intact. There is large (67-100%) red granulation within the wound bed. There is no necrotic tissue within the wound bed. Wound #8 status is Healed - Epithelialized. Original cause of wound was Gradually Appeared. The wound is located on the Left,Anterior Lower Leg. The wound measures 0cm length x 0cm width x 0cm depth; 0cm^2 area and 0cm^3 volume. There is no tunneling or undermining noted. There is a small amount of serous drainage noted. The wound margin is flat and intact. There is no granulation within the wound bed. There is no necrotic tissue within the wound bed. Assessment Active Problems ICD-10 Non-pressure chronic ulcer of right calf with fat layer exposed Type 2 diabetes mellitus with other skin ulcer Chronic venous hypertension (idiopathic) with  inflammation of right lower extremity Laceration without foreign body, right lower leg, subsequent encounter Procedures Wound #6 Pre-procedure diagnosis of Wound #6 is a Venous Leg Ulcer located on the Right,Proximal,Posterior Lower Leg . There was a Three Layer Compression Therapy Procedure by Baruch Gouty, RN. Post procedure Diagnosis Wound #6: Same as Pre-Procedure Plan Follow-up Appointments: Return Appointment in 1 week. - Thursday Dressing Change Frequency: Do not change entire dressing for one week. Skin Barriers/Peri-Wound Care: Moisturizing lotion - both legs patient to lotion daily at night. Wound Cleansing: May shower with protection. Primary Wound Dressing: Wound #6 Right,Proximal,Posterior Lower Leg: Calcium Alginate with Silver Secondary Dressing: Wound #6 Right,Proximal,Posterior Lower Leg: Foam - apply over wound. apply over lower scarred area. apply foam for protection to anterior portion of lower leg. Edema Control: 3 Layer Compression System - Right Lower Extremity Patient to wear own compression stockings - patient to bring in on next wound care appointment. Avoid standing for long periods of time Elevate legs to the level of the heart or above for 30 minutes daily and/or when sitting, a frequency of: - 3-4 times a day. Exercise regularly Support Garment 20-30 mm/Hg pressure to: - patient to apply compression stockings on left leg in the morning and remove at night. 1. Continue with silver  alginate with 3 layer compression to the right lower extremity 2. The area on the back of the right calf had a different configuration today. I do not even believe this was the same wound although it is certainly in the very same small area. 3. We transitioned him into his own 20/30 below-knee stockings on the left leg but will carefully observe this next time/next week 4. I wondered out loud today whether the 20/30's are going to be enough to maintain skin integrity on  the right leg Electronic Signature(s) Signed: 06/16/2019 2:35:12 PM By: Linton Ham MD Entered By: Linton Ham on 06/16/2019 14:01:57 -------------------------------------------------------------------------------- SuperBill Details Patient Name: Date of Service: [name] 06/16/2019 Medical Record [id number] Patient Account Number: [id number] Date of Birth/Sex: Treating RN: 22-Oct-1955 (63 y.o. [name], Tammi Klippel Primary Care Provider: [name] Other Clinician: Referring Provider: Treating Provider/Extender:Kameryn Davern, [name], [name] in Treatment: 46 Diagnosis Coding ICD-10 Codes Code Description [phone] Non-pressure chronic ulcer of right calf with fat layer exposed E11.622 Type 2 diabetes mellitus with other skin ulcer I87.321 Chronic venous hypertension (idiopathic) with inflammation of right lower extremity S81.811D Laceration without foreign body, right lower leg, subsequent encounter S81.812D Laceration without foreign body, left lower leg, subsequent encounter L03.115 Cellulitis of right lower limb Facility Procedures CPT4 Code Description: [id number] (Facility Use Only) [phone] - [facility] RT LEG Modifier: Quantity: 1 Physician Procedures CPT4 Code Description: [id number] [facility] - WC PHYS LEVEL 3 - EST PT ICD-10 Diagnosis Description L97.212 Non-pressure chronic ulcer of right calf with fat layer expo I87.321 Chronic venous hypertension (idiopathic) with inflammation o extremity Modifier: sed f right lower Quantity: 1 Electronic Signature(s) Signed: 06/16/2019 2:35:12 PM By: Linton Ham MD Entered By: Linton Ham on 06/16/2019 14:02:20

## 2019-06-18 ENCOUNTER — Other Ambulatory Visit: Payer: Self-pay | Admitting: Internal Medicine

## 2019-06-21 NOTE — Progress Notes (Signed)
[name], [name] ([id number]) Visit Report for 06/16/2019 Arrival Information Details Patient Name: Date of Service: REDMOND, WHITTLEY 06/16/2019 12:45 PM Medical Record [id number] Patient Account Number: [id number] Date of Birth/Sex: Treating RN: Nov 29, 1955 (64 y.o. [name] Primary Care Provider: [name] Other Clinician: Referring Provider: Treating Provider/Extender:Robson, [name], [name] in Treatment: 48 Visit Information History Since Last Visit Added or deleted any medications: No Patient Arrived: Ambulatory Any new allergies or adverse reactions: No Arrival Time: 13:04 Had a fall or experienced change in No Accompanied By: self activities of daily living that may affect Transfer Assistance: None risk of falls: Patient Identification Verified: Yes Signs or symptoms of abuse/neglect since last No Secondary Verification Process Yes visito Completed: Hospitalized since last visit: No Patient Requires Transmission-Based No Implantable device outside of the clinic excluding No Precautions: cellular tissue based products placed in the center Patient Has Alerts: Yes since last visit: Patient Alerts: Patient on Blood Has Dressing in Place as Prescribed: Yes Thinner Has Compression in Place as Prescribed: Yes xarelto Pain Present Now: No R TBI = .86, L TBI = .87 Electronic Signature(s) Signed: 06/21/2019 5:52:47 PM By: Baruch Gouty RN, BSN Entered By: Baruch Gouty on 06/16/2019 13:04:32 -------------------------------------------------------------------------------- Compression Therapy Details Patient Name: Date of Service: [name] 06/16/2019 12:45 PM Medical Record [id number] Patient Account Number: [id number] Date of Birth/Sex: Treating RN: 1955-09-13 (64 y.o. [name] Primary Care Provider: [name] Other Clinician: Referring Provider: Treating Provider/Extender:Robson,  [name], [name] in Treatment: 46 Compression Therapy Performed for Wound Wound #6 Right,Proximal,Posterior Lower Leg Assessment: Performed By: Clinician Baruch Gouty, RN Compression Type: Three Layer Post Procedure Diagnosis Same as Pre-procedure Electronic Signature(s) Signed: 06/16/2019 2:55:42 PM By: Deon Pilling Entered By: Deon Pilling on 06/16/2019 13:39:02 -------------------------------------------------------------------------------- Encounter Discharge Information Details Patient Name: Date of Service: [name] 06/16/2019 12:45 PM Medical Record [id number] Patient Account Number: [id number] Date of Birth/Sex: Treating RN: [birth year] (64 y.o. [name] Primary Care Provider: [name] Other Clinician: Referring Provider: Treating Provider/Extender:Robson, [name], [name] in Treatment: 46 Encounter Discharge Information Items Discharge Condition: Stable Ambulatory Status: Ambulatory Discharge Destination: Home Transportation: Private Auto Accompanied By: alone Schedule Follow-up Appointment: Yes Clinical Summary of Care: Patient Declined Electronic Signature(s) Signed: 06/16/2019 3:10:57 PM By: Levan Hurst RN, BSN Entered By: Levan Hurst on 06/16/2019 14:09:52 -------------------------------------------------------------------------------- Lower Extremity Assessment Details Patient Name: Date of Service: [name] 06/16/2019 12:45 PM Medical Record [id number] Patient Account Number: [id number] Date of Birth/Sex: Treating RN: 06-16-1956 (64 y.o. [name] Primary Care Provider: [name] Other Clinician: Referring Provider: Treating Provider/Extender:Robson, [name], [name] in Treatment: 46 Edema Assessment Assessed: [Left: No] [Right: No] Edema: [Left: No] [Right: No] Calf Left: Right: Point of Measurement: cm From Medial Instep  40.2 cm 38.5 cm Ankle Left: Right: Point of Measurement: cm From Medial Instep 23.4 cm 22.2 cm Vascular Assessment Pulses: Dorsalis Pedis Palpable: [Left:Yes] [Right:Yes] Electronic Signature(s) Signed: 06/21/2019 5:52:47 PM By: Baruch Gouty RN, BSN Entered By: Baruch Gouty on 06/16/2019 13:17:07 -------------------------------------------------------------------------------- Multi Wound Chart Details Patient Name: Date of Service: [name] 06/16/2019 12:45 PM Medical Record [id number] Patient Account Number: [id number] Date of Birth/Sex: Treating RN: [birth year] (64 y.o. M) Primary Care Provider: [name] Other Clinician: Referring Provider: Treating Provider/Extender:Robson, [name], [name] in Treatment: 46 Vital Signs Height(in): 73 Pulse(bpm): 52 Weight(lbs): 290 Blood Pressure(mmHg): 146/84 Body Mass Index(BMI): 38 Temperature(F): 97.6 Respiratory 18 Rate(breaths/min): Photos: [6:No Photos] [8:No Photos] [N/A:N/A] Wound Location: [  6:Right Lower Leg - Posterior, Left Lower Leg - Anterior Proximal] [N/A:N/A] Wounding Event: [6:Gradually Appeared] [8:Gradually Appeared] [N/A:N/A] Primary Etiology: [6:Venous Leg Ulcer] [8:Venous Leg Ulcer] [N/A:N/A] Comorbid History: [6:Asthma, Sleep Apnea, Arrhythmia, Congestive Heart Failure, Hypertension, Heart Failure, Hypertension, Type II Diabetes] [8:Asthma, Sleep Apnea, Arrhythmia, Congestive Type II Diabetes] [N/A:N/A] Date Acquired: [6:01/06/2019] [8:02/14/2019] [N/A:N/A] Weeks of Treatment: [6:23] [8:17] [N/A:N/A] Wound Status: [6:Open] [8:Healed - Epithelialized] [N/A:N/A] Measurements L x W x D 2.2x0.9x0.1 [8:0x0x0] [N/A:N/A] (cm) Area (cm) : [6:1.555] [8:0] [N/A:N/A] Volume (cm) : [6:0.156] [8:0] [N/A:N/A] % Reduction in Area: [6:-295.70%] [8:100.00%] [N/A:N/A] % Reduction in Volume: [6:-300.00%] [8:100.00%] [N/A:N/A] Classification: [6:Full Thickness Without Exposed  Support Structures Exposed Support Structures] [8:Full Thickness Without] [N/A:N/A] Exudate Amount: [6:Small] [8:Small] [N/A:N/A] Exudate Type: [6:Serosanguineous] [8:Serous] [N/A:N/A] Exudate Color: [6:red, brown] [8:amber] [N/A:N/A] Wound Margin: [6:Flat and Intact] [8:Flat and Intact] [N/A:N/A] Granulation Amount: [6:Large (67-100%)] [8:None Present (0%)] [N/A:N/A] Granulation Quality: [6:Red] [8:N/A] [N/A:N/A] Necrotic Amount: [6:None Present (0%)] [8:None Present (0%)] [N/A:N/A] Exposed Structures: [6:Fat Layer (Subcutaneous Fascia: No Tissue) Exposed: Yes Fascia: No Tendon: No Muscle: No Joint: No Bone: No] [8:Fat Layer (Subcutaneous Tissue) Exposed: No Tendon: No Muscle: No Joint: No Bone: No] [N/A:N/A] Epithelialization: [6:Large (67-100%) Compression Therapy] [8:Large (67-100%) N/A] [N/A:N/A N/A] Treatment Notes Electronic Signature(s) Signed: 06/16/2019 2:35:12 PM By: Linton Ham MD Entered By: Linton Ham on 06/16/2019 13:56:51 -------------------------------------------------------------------------------- Multi-Disciplinary Care Plan Details Patient Name: Date of Service: [name]. 06/16/2019 12:45 PM Medical Record [id number] Patient Account Number: [id number] Date of Birth/Sex: Treating RN: Apr 11, 1956 (64 y.o. [name] Primary Care Renn Stille: [name] Other Clinician: Referring Latoyna Hird: Treating Sion Thane/Extender:Robson, [name], [name] in Treatment: 46 Active Inactive Venous Leg Ulcer Nursing Diagnoses: Actual venous Insuffiency (use after diagnosis is confirmed) Knowledge deficit related to disease process and management Goals: Non-invasive venous studies are completed as ordered Date Initiated: 09/17/2018 Date Inactivated: 09/23/2018 Target Resolution Date: 09/23/2018 Goal Status: Met Patient will maintain optimal edema control Date Initiated: 09/17/2018 Target Resolution Date: 07/15/2019 Goal Status:  Active Patient/caregiver will verbalize understanding of disease process and disease management Date Initiated: 09/17/2018 Date Inactivated: 09/23/2018 Target Resolution Date: 10/15/2018 Goal Status: Met Interventions: Assess peripheral edema status every visit. Compression as ordered Provide education on venous insufficiency Treatment Activities: Therapeutic compression applied : 09/17/2018 Notes: Electronic Signature(s) Signed: 06/16/2019 2:55:42 PM By: Deon Pilling Entered By: Deon Pilling on 06/16/2019 13:34:05 -------------------------------------------------------------------------------- Pain Assessment Details Patient Name: Date of Service: BASSEL, [name] 06/16/2019 12:45 PM Medical Record [id number] Patient Account Number: [id number] Date of Birth/Sex: Treating RN: 1956-03-28 (64 y.o. [name] Primary Care Conlan Miceli: [name] Other Clinician: Referring Bretta Fees: Treating Denea Cheaney/Extender:Robson, [name], [name] in Treatment: 46 Active Problems Location of Pain Severity and Description of Pain Patient Has Paino No Site Locations Rate the pain. Current Pain Level: 0 Pain Management and Medication Current Pain Management: Electronic Signature(s) Signed: 06/21/2019 5:52:47 PM By: Baruch Gouty RN, BSN Entered By: Baruch Gouty on 06/16/2019 13:16:07 -------------------------------------------------------------------------------- Patient/Caregiver Education Details Patient Name: Date of Service: [name] 12/31/2020andnbsp12:45 PM Medical Record Patient Account Number: [id number] Number: Treating RN: Deon Pilling 1955-11-07 (63 y.o. Other Clinician: Date of Birth/Gender: M) Treating Linton Ham Primary Care Physician: [name] Physician/Extender: Referring Physician: Elicia Lamp in Treatment: 70 Education Assessment Education Provided To: Patient Education Topics  Provided Venous: Handouts: Managing Venous Disease and Related Ulcers Methods: Explain/Verbal Responses: Reinforcements needed Electronic Signature(s) Signed: 06/16/2019 2:55:42 PM By: Deon Pilling Entered By: Deon Pilling on 06/16/2019 13:34:16 -------------------------------------------------------------------------------- Wound  Assessment Details Patient Name: Date of Service: [name], [name] 06/16/2019 12:45 PM Medical Record [id number] Patient Account Number: [id number] Date of Birth/Sex: Treating RN: [birth year] (64 y.o. [name] Primary Care Provider: [name] Other Clinician: Referring Provider: Treating Provider/Extender:Robson, [name], [name] in Treatment: 46 Wound Status Wound Number: 6 Primary Venous Leg Ulcer Etiology: Wound Location: Right Lower Leg - Posterior, Proximal Wound Open Status: Wounding Event: Gradually Appeared Comorbid Asthma, Sleep Apnea, Arrhythmia, Congestive Date Acquired: 01/06/2019 History: Heart Failure, Hypertension, Type II Diabetes Weeks Of Treatment: 23 Clustered Wound: No Photos Wound Measurements Length: (cm) 2.2 % Reduct Width: (cm) 0.9 % Reduct Depth: (cm) 0.1 Epitheli Area: (cm) 1.555 Tunneli Volume: (cm) 0.156 Undermi Wound Description Full Thickness Without Exposed Support Foul Odo Classification: Structures Slough/F Wound Flat and Intact Margin: Exudate Small Amount: Exudate Serosanguineous Type: Exudate red, brown Color: Wound Bed Granulation Amount: Large (67-100%) Granulation Quality: Red Fascia E Necrotic Amount: None Present (0%) Fat Laye Tendon E Muscle E Joint Ex Bone Exp r After Cleansing: No ibrino Yes Exposed Structure xposed: No r (Subcutaneous Tissue) Exposed: Yes xposed: No xposed: No posed: No osed: No ion in Area: -295.7% ion in Volume: -300% alization: Large (67-100%) ng: No ning: No Treatment Notes Wound #6 (Right, Proximal,  Posterior Lower Leg) 1. Cleanse With Soap and water 2. Periwound Care Moisturizing lotion 3. Primary Dressing Applied Calcium Alginate Ag 4. Secondary Dressing Foam 6. Support Layer Applied 3 layer compression wrap Electronic Signature(s) Signed: 06/21/2019 3:29:37 PM By: Mikeal Hawthorne EMT/HBOT Signed: 06/21/2019 5:52:47 PM By: Baruch Gouty RN, BSN Entered By: Mikeal Hawthorne on 06/21/2019 13:39:55 -------------------------------------------------------------------------------- Wound Assessment Details Patient Name: Date of Service: [name], [name] 06/16/2019 12:45 PM Medical Record [id number] Patient Account Number: [id number] Date of Birth/Sex: Treating RN: [birth year] (64 y.o. [name] Primary Care Provider: [name] Other Clinician: Referring Provider: Treating Provider/Extender:Robson, [name], [name] in Treatment: 46 Wound Status Wound Number: 8 Primary Venous Leg Ulcer Etiology: Wound Location: Left Lower Leg - Anterior Wound Healed - Epithelialized Wounding Event: Gradually Appeared Status: Date Acquired: 02/14/2019 Comorbid Asthma, Sleep Apnea, Arrhythmia, Congestive Weeks Of Treatment: 17 History: Heart Failure, Hypertension, Type II Diabetes Clustered Wound: No Photos Wound Measurements Length: (cm) 0 % Reduc Width: (cm) 0 % Reduc Depth: (cm) 0 Epithel Area: (cm) 0 Tunnel Volume: (cm) 0 Underm Wound Description Classification: Full Thickness Without Exposed Support Foul Od Structures Slough/ Wound Flat and Intact Margin: Exudate Small Amount: Exudate Serous Type: Exudate amber Color: Wound Bed Granulation Amount: None Present (0%) Necrotic Amount: None Present (0%) Fascia Fat Lay Tendon Muscle Joint E Bone Ex or After Cleansing: No Fibrino No Exposed Structure Exposed: No er (Subcutaneous Tissue) Exposed: No Exposed: No Exposed: No xposed: No posed: No tion in Area: 100% tion in  Volume: 100% ialization: Large (67-100%) ing: No ining: No Electronic Signature(s) Signed: 06/21/2019 3:29:37 PM By: Mikeal Hawthorne EMT/HBOT Signed: 06/21/2019 5:52:47 PM By: Baruch Gouty RN, BSN Entered By: Mikeal Hawthorne on 06/21/2019 13:39:35 -------------------------------------------------------------------------------- Vitals Details Patient Name: Date of Service: [name]. 06/16/2019 12:45 PM Medical Record [id number] Patient Account Number: [id number] Date of Birth/Sex: Treating RN: [birth year] (64 y.o. [name] Primary Care Provider: [name] Other Clinician: Referring Provider: Treating Provider/Extender:Robson, [name], [name] in Treatment: 46 Vital Signs Time Taken: 13:04 Temperature (F): 97.6 Height (in): 73 Pulse (bpm): 52 Source: Stated Respiratory Rate (breaths/min): 18 Weight (lbs): 290 Blood Pressure (mmHg): 146/84 Source: Stated Reference Range: 80 - 120  mg / dl Body Mass Index (BMI): 38.3 Electronic Signature(s) Signed: 06/21/2019 5:52:47 PM By: Baruch Gouty RN, BSN Entered By: Baruch Gouty on 06/16/2019 13:15:57

## 2019-06-24 ENCOUNTER — Encounter (HOSPITAL_BASED_OUTPATIENT_CLINIC_OR_DEPARTMENT_OTHER): Payer: BC Managed Care – PPO | Attending: Internal Medicine | Admitting: Internal Medicine

## 2019-06-24 ENCOUNTER — Other Ambulatory Visit: Payer: Self-pay

## 2019-06-24 DIAGNOSIS — L97212 Non-pressure chronic ulcer of right calf with fat layer exposed: Secondary | ICD-10-CM | POA: Insufficient documentation

## 2019-06-24 DIAGNOSIS — M5412 Radiculopathy, cervical region: Secondary | ICD-10-CM | POA: Insufficient documentation

## 2019-06-24 DIAGNOSIS — I110 Hypertensive heart disease with heart failure: Secondary | ICD-10-CM | POA: Diagnosis not present

## 2019-06-24 DIAGNOSIS — I4891 Unspecified atrial fibrillation: Secondary | ICD-10-CM | POA: Insufficient documentation

## 2019-06-24 DIAGNOSIS — I87311 Chronic venous hypertension (idiopathic) with ulcer of right lower extremity: Secondary | ICD-10-CM | POA: Diagnosis not present

## 2019-06-24 DIAGNOSIS — E11622 Type 2 diabetes mellitus with other skin ulcer: Secondary | ICD-10-CM | POA: Insufficient documentation

## 2019-06-24 DIAGNOSIS — Z7901 Long term (current) use of anticoagulants: Secondary | ICD-10-CM | POA: Diagnosis not present

## 2019-06-24 DIAGNOSIS — Z8616 Personal history of COVID-19: Secondary | ICD-10-CM | POA: Diagnosis not present

## 2019-06-24 DIAGNOSIS — L97819 Non-pressure chronic ulcer of other part of right lower leg with unspecified severity: Secondary | ICD-10-CM | POA: Insufficient documentation

## 2019-06-24 DIAGNOSIS — I429 Cardiomyopathy, unspecified: Secondary | ICD-10-CM | POA: Diagnosis not present

## 2019-06-24 DIAGNOSIS — I87313 Chronic venous hypertension (idiopathic) with ulcer of bilateral lower extremity: Secondary | ICD-10-CM | POA: Diagnosis not present

## 2019-06-24 DIAGNOSIS — I5022 Chronic systolic (congestive) heart failure: Secondary | ICD-10-CM | POA: Diagnosis not present

## 2019-06-24 DIAGNOSIS — G4730 Sleep apnea, unspecified: Secondary | ICD-10-CM | POA: Insufficient documentation

## 2019-06-24 DIAGNOSIS — J45909 Unspecified asthma, uncomplicated: Secondary | ICD-10-CM | POA: Diagnosis not present

## 2019-06-24 DIAGNOSIS — L97822 Non-pressure chronic ulcer of other part of left lower leg with fat layer exposed: Secondary | ICD-10-CM | POA: Insufficient documentation

## 2019-06-24 DIAGNOSIS — I872 Venous insufficiency (chronic) (peripheral): Secondary | ICD-10-CM | POA: Insufficient documentation

## 2019-06-27 DIAGNOSIS — S81811D Laceration without foreign body, right lower leg, subsequent encounter: Secondary | ICD-10-CM | POA: Diagnosis not present

## 2019-06-28 NOTE — Progress Notes (Addendum)
[name], [name] ([name]) Visit Report for 06/24/2019 HPI Details Patient Name: Date of Service: LAQUON, SRIDHAR 06/24/2019 2:00 PM Medical Record [name] Patient Account Number: [id number] Date of Birth/Sex: Treating RN: 1956-05-26 (64 y.o. M) Primary Care Provider: [name] Other Clinician: Referring Provider: Treating Provider/Extender:Ignazio Kincaid, [name], [name] in Treatment: 47 History of Present Illness HPI Description: ADMISSION 07/29/2018 Mr. Wender is a 64 year old man with either prediabetes or diabetes he is on glipizide. In late October to November 2019 he noted a scabbed area on the back of his right calf. He picked this off a few times but it would not heal. He saw his primary physician on 12/5. It was felt at that time that this may actually heal on its own with conservative management. The next visit was on 07/20/2018 noted the area was a lot worse and arranged for his treatment here. He has been topical antibiotics like Neosporin although he stopped using this when the wound looked worse. He is just been covering this with a clean Band-Aid. He is given Bactrim a week ago and he is finishing this currently. It is made some improvement in the surrounding erythema per the patient. He does not have a history of nonhealing wounds. No prior history of wounds on his legs that he had difficulty healing. No prior skin issues. 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 Past medical history; A. fib status post ablation and recent cardioversion he is on Xarelto. He has a history of systolic heart failure, cervical radiculopathy, cardiomyopathy and type 2 diabetes as discussed ABI in the right leg was noncompressible on the right 2/20; the biopsy I did on the patient last week was negative for malignancy. Culture grew Pseudomonas and methicillin sensitive staph aureus. He is on cefdinir 300 twice a day. I will have to make this a 10 day  course. He put him in compression. He states that the redness pain and erythema are a lot better. I suspect the patient has chronic venous insufficiency probably with a secondary cellulitis 2/27; arrives today with copious amounts of drainage coming out of the wound irritating the skin below the wound area. He only renewed the final 3 days of cefdinir today 3/5; came in for a nurse change 3 days ago. Again a lot of drainage noted. Zinc oxide was applied unfortunately the drainage appears to a pool then he has a string of superficial open areas extending down into the Achilles area and some just below the wound. The wound itself does not look too bad. He is completed the antibiotics although apparently there was separation from the original 7 with the last 3 days. Culture grew a few MSSA and a few Pseudomonas 3/12; not too much difference over the last week. He came in for a dressing change on Monday by our nursing staff. Necrotic debris again over the wound surface. The entire area looks irritated but nontender and I do not think shows obvious evidence of infection. We have not heard anything about the reflux studies. Also notable than in this diabetic man we had noncompressible vessels and although I can feel his pulses easily in his feet I will order arterial studies as well. 3/19-Patient had experience more pain has been dressed with a silver alginate, the wounds all have necrotic debris, very friable with easy bleeding with any kind of surface debridement including with gauze and Anasept and with a #3 curette. His vascular appointments unfortunately were all canceled on account of  the virus outbreak resulting in studies only being done for emergent cases. His pulses are easily palpable in the lower extremity. He has open areas below the primary wound where he states the drainage which included purulent material also with some blood pooled and caused breakdown. 3/26; the patient was changed  to Santyl with [facility] backing last week. This was largely because the silver alginate was sticking to the wound. He is still having quite a bit of pain. He tells [place] that the reflux studies and arterial studies we had attempted to arrange through vein and vascular are not being booked until mid May. Culture that was done last week showed both Serratia moderate and a few methicillin sensitive staph aureus I started him on Bactrim DS 1 p.o. twice daily on 3/23 for 7 days. He is here for follow-up. 4/3; patient is on Santyl with Hydrofera Blue. Patient complains of pain. Our intake nurse is noted drainage and some odor. He has completed Bactrim recently for Serratia and a few methicillin sensitive staph aureus. After our staff push hard last week we were able to get venous studies as well as arterial studies. His arterial studies showed on the right great toe pressure of 0.86. On the left ABI at 1.47 TBI of 0.87. On both sides waveforms were triphasic VENOUS STUDIES showed reflux in the femoral vein, popliteal vein great saphenous vein at the saphenofemoral junction and great saphenous vein at the proximal thigh. Also noted to have age-indeterminate superficial vein thrombosis involving the small saphenous vein. Notable that the patient is already on Xarelto for atrial fibrillation. 4/9; the patient has been to see vascular surgery and reviewed by Dr. [name]. He was felt to have only minimal superficial venous reflux in the right great greater saphenous vein. For this reason he was not felt to be a candidate for laser ablation of the right greater saphenous vein. A wedge cushion was suggested to keep his leg elevated. He does have deep venous reflux on the right. Culture I did last time showed a combination of Serratia Pseudomonas and methicillin sensitive staph aureus. We have been using Hydrofera Blue to the large wound, Santyl to some of the deep punched out satellite lesions distal to it.  There is some improvement [id number]; the application for Apligraf was put out for further review for material that we have resubmitted. He has the deep area on the right posterior calf which is large and then 3 small punched out areas beneath this. In general his wounds look a lot better 4/23; the patient has his large original wound and then 2 punched out satellite lesions below it on the right posterior calf. I was usually able to use Apligraf #1 to cover the full surface area of the larger wound. We continued with Santyl and Hydrofera Blue on the 2 punched-out areas 5/7; patient has his large original wound and 2 punched out satellite lesions below it in the right posterior calf. Apligraf #2 today. Major improvement in the big wound in terms of wound depth. Punched-out [place] wounds have a very healthy looking wound bed. 5/21-Patient comes back for his large right posterior calf wound for which she has been an Apligraf #3 today. Wound depth seems to be better, the punched-out wounds appear to have healthy bed with some bleeding 6/4; right posterior calf wound is much better. Apligraf #4 today. The major wound has no wound depth at all. Only 1 of the satellite lesions is still open. The patient has very  high blood pressure coming in today with a diastolic blood pressure of 130 after lying there for a few minutes it was down to 110. He states that is running [id number] diastolic at home. He was high last week as well I thought he was on 50 mg 1/2 tablet twice daily of losartan I told him to double up on that however it turns out he is actually on 50 twice daily. Course he is run out of his medications prematurely. He has a follow-up with his primary's office next Wednesday. 6/18; patient's wounds continue to improve. The small area laterally is just about closed and there is continued epithelialization on the area on the posterior calf. 7/2; we applied his last Apligraf last visit. Early the next week  there was a lot of purulent looking drainage that I cultured this grew staph and Pseudomonas however when we had him back for the next visit to 3 days later everything looked a lot better. He did not receive systemic antibiotics. Fortunately we did not have to remove the Apligraf. He has had heart trouble this week he was having an ablation apparently they have discovered a clot in his left atrium they have changed all his anticoagulants as well as his blood pressure medication. 7/16; using Hydrofera Blue. We are making nice progress towards closure. He has not yet ordered his compression stockings he has his measurements 7/23; dimensions are better. He has a new satellite area superiorly. Both wounds cauterized with silver nitrate. 7/30; absolutely no change in the major wound on the posterior part of the right calf. We have been using Hydrofera Blue for a prolonged period of time and really had a nice improvement but over the last few weeks this is stalled. There is no depth. He arrived in clinic today with a new area on the right anterior tibia which apparently was an "ingrown hair". It is clearly an open area. This looks like a wrap injury although he was not really aware of it. 8/31; since the patient was last in clinic he was admitted to hospital from 8/17 through 8/22. He had had multiple falls at home including one off a ladder. He was admitted to hospital with mild COVID-19 viral pneumonia. Noted to be hyperkalemic and in acute renal failure. He has chronic systolic heart failure with ejection fraction of 35%. He arrives back in clinic with the original wound on the posterior aspect of the right lower calf looking a lot better. He has eschared areas from falls and lacerations on his anterior knees bilaterally just below the patella and on the mid part of his tibia bilaterally. He seems to have a small area on the right lateral ankle as well 9/10- Patient comes to clinic with a 2 new  wounds one on the left anterior shin and one on the right posterior leg both the left knee and right knee areas are healed up. We have been using Santyl to the left anterior leg and silver alginate to right posterior leg wounds. 9/17; the patient is original wound looks good on the posterior right calf. He has traumatic areas on the left anterior shin left anterior patellar tendon and the right mid tibia area. Except for the right posterior calf all required debridement. We have been using Santyl on these areas and silver alginate posteriorly right calf 9/24; the original wound on the posterior calf on the right looks good. This is healthy. There are traumatic wounds in the left anterior shin, left anterior  patella in the right mid tibia area are about the same. He has dusky erythema on the left anterior tibia which led me to give him antibiotics last week. This does not look too much different. This is nontender and I wonder if this is all just venous inflammation. 10/2; the patient has 2 open areas on the right posterior calf both of these look good we have been using silver alginate. He has traumatic wounds on the left and right mid tibia areas. The surface of these wounds is still not ready for closure. We have been using Iodoflex. Finally has areas on the left knee. The latter wounds are all traumatic after a fall 10/9. His original wounds of the right posterior calf are superficial and progressing towards closure. His traumatic wounds on the left anterior tibia and right anterior tibia are considerably improved in terms of the wound surface. He asked me about a skin graft in these areas I do not think this is going to be necessary. Change from Iodosorb to [facility]. The area on the left knee is just about closed as well 10/16; his original wound on the right posterior calf very superficial looks healthy. He has more recent traumatic wounds on the left anterior tibia and right anterior  tibia both of these have better looking surfaces and measuring smaller. Might be beneficial for an advanced treatment product. The area on his left knee has a small superficial scab and we are going to close that when 10/23; his original wound areas on the right posterior calf continue to improve. The more recently traumatic wounds on the left anterior tibia and right anterior tibia both look better in terms of surfaces. No debridement required. We have been using Hydrofera Blue. He is approved for Apligraf 11/3; Apligraf #1 applied to the left anterior tibia right anterior tibia and to 2 small areas remaining on the right posterior calf which were part of his initial wound area. 11/19; Apligraf #2. Left anterior tibia is healed. Right anterior tibia much better. Only one small area remains on the right posterior calf which was part of his initial wound area 12/3; the left anterior tibia has opened up again. Hyper granulated nodule. Right anterior tibia is superficial and larger. There is no depth of this. I did not place Apligraf today return to [facility] under compression 12/10; the patient has 2 remaining wounds 1 on the right posterior and the other on the left anterior. Both of these look quite good. No debridement is required we have been using Hydrofera Blue under compression. 12/17; the patient has really not closed over which is disappointing. His wraps were very tight and there may be some wrap injury issues. On the left he has the original wound on the medial mid tibia he has a wrap injury on the lateral mid tibia. On the right his posterior areas are superficial but certainly not closed 12/31; the patient has totally closed on the left. He will transition to his own 20/30 below-knee stocking on the left leg today. Interestingly on the right posterior calf the 2 wounds that he had from last time of closed however there is an additional injury in this same area. Almost looks as  though there is subcutaneous fluid/blistering. There is no evidence of cellulitis this does not seem tender 06/24/2019; the patient comes in having been placed in his 20/30 stockings on the left leg last week. He states that he noted blisters break open on Saturday and they had  opened into wounds on the anterior tibial area on the left by Monday. The area on the posterior right leg looks satisfactory we are still doing that and compression. We are using calcium alginate to all wound areas Electronic Signature(s) Signed: 06/24/2019 4:35:49 PM By: Linton Ham MD Entered By: Linton Ham on 06/24/2019 14:03:48 -------------------------------------------------------------------------------- Physical Exam Details Patient Name: Date of Service: [name] 06/24/2019 2:00 PM Medical Record [id number] Patient Account Number: [id number] Date of Birth/Sex: Treating RN: 1955/11/29 (64 y.o. M) Primary Care Provider: [name] Other Clinician: Referring Provider: Treating Provider/Extender:Elizabth Palka, [name], [name] in Treatment: 26 Constitutional Patient is hypertensive.. Pulse regular and within target range for patient.[name] Respirations regular, non-labored and within target range.. Temperature is normal and within the target range for the patient.[name] Appears in no distress. Respiratory work of breathing is normal. Cardiovascular No evidence of systemic fluid volume overload. Patient has 2-3+ pitting edema and is left calf and pretibial area. Some degree of venous inflammation. Notes Wound exam Still the superficial area on the posterior right calf I was hoping that would be closed. This appears to be making progress however On the left anterior tibial area 2 superficial areas 1 on the central part of the tibia and 1 medially. There is venous inflammation here/stasis dermatitis but no evidence of cellulitis Electronic Signature(s) Signed: 06/24/2019 4:35:49  PM By: Linton Ham MD Entered By: Linton Ham on 06/24/2019 14:06:18 -------------------------------------------------------------------------------- Physician Orders Details Patient Name: Date of Service: [name] 06/24/2019 2:00 PM Medical Record [id number] Patient Account Number: [id number] Date of Birth/Sex: Treating RN: [birth year] (64 y.o. [name] Primary Care Provider: [name] Other Clinician: Referring Provider: Treating Provider/Extender:Caroleen Stoermer, [name], [name] in Treatment: 70 Verbal / Phone Orders: No Diagnosis Coding ICD-10 Coding Code Description [phone] Non-pressure chronic ulcer of right calf with fat layer exposed E11.622 Type 2 diabetes mellitus with other skin ulcer I87.321 Chronic venous hypertension (idiopathic) with inflammation of right lower extremity S81.811D Laceration without foreign body, right lower leg, subsequent encounter Follow-up Appointments Return Appointment in 1 week. Dressing Change Frequency Do not change entire dressing for one week. Skin Barriers/Peri-Wound Care Moisturizing lotion - both legs patient to lotion daily at night. Wound Cleansing May shower with protection. Primary Wound Dressing Wound #12 Left,Medial Lower Leg Calcium Alginate with Silver Wound #6 Right,Proximal,Posterior Lower Leg Calcium Alginate with Silver Wound #8R Left,Anterior Lower Leg Calcium Alginate with Silver Edema Control 3 Layer Compression System - Bilateral Patient to wear own compression stockings - patient to bring in on next wound care appointment. Avoid standing for long periods of time Elevate legs to the level of the heart or above for 30 minutes daily and/or when sitting, a frequency of: - 3-4 times a day. Exercise regularly Support Garment 30-40 mm/Hg pressure to: - Will order Juxtalites-HD for both legs Electronic Signature(s) Signed: 06/24/2019 4:35:49 PM By: Linton Ham  MD Signed: 06/28/2019 6:18:44 PM By: Levan Hurst RN, BSN Entered By: Levan Hurst on 06/24/2019 13:53:26 -------------------------------------------------------------------------------- Problem List Details Patient Name: Date of Service: [name] 06/24/2019 2:00 PM Medical Record [id number] Patient Account Number: [id number] Date of Birth/Sex: Treating RN: 23-Mar-1956 (64 y.o. [name] Primary Care Provider: [name] Other Clinician: Referring Provider: Treating Provider/Extender:Carley Glendenning, [name], [name] in Treatment: 47 Active Problems ICD-10 Evaluated Encounter Code Description Active Date Today Diagnosis L97.212 Non-pressure chronic ulcer of right calf with fat layer 07/29/2018 No Yes exposed E11.622 Type 2 diabetes mellitus with other skin ulcer 07/29/2018 No  Yes I87.321 Chronic venous hypertension (idiopathic) with 07/29/2018 No Yes inflammation of right lower extremity S81.811D Laceration without foreign body, right lower leg, 02/14/2019 No Yes subsequent encounter Inactive Problems ICD-10 Code Description Active Date Inactive Date S81.812D Laceration without foreign body, left lower leg, subsequent 02/14/2019 02/14/2019 encounter L03.115 Cellulitis of right lower limb 09/24/2018 09/24/2018 Resolved Problems Electronic Signature(s) Signed: 06/24/2019 4:35:49 PM By: Linton Ham MD Entered By: Linton Ham on 06/24/2019 13:58:23 -------------------------------------------------------------------------------- Progress Note Details Patient Name: Date of Service: [name] 06/24/2019 2:00 PM Medical Record [id number] Patient Account Number: [id number] Date of Birth/Sex: Treating RN: July 10, 1955 (64 y.o. M) Primary Care Provider: [name] Other Clinician: Referring Provider: Treating Provider/Extender:Alana Dayton, [name], [name] in Treatment: 47 Subjective History of Present  Illness (HPI) ADMISSION 07/29/2018 Mr. Gephart is a 64 year old man with either prediabetes or diabetes he is on glipizide. In late October to November 2019 he noted a scabbed area on the back of his right calf. He picked this off a few times but it would not heal. He saw his primary physician on 12/5. It was felt at that time that this may actually heal on its own with conservative management. The next visit was on 07/20/2018 noted the area was a lot worse and arranged for his treatment here. He has been topical antibiotics like Neosporin although he stopped using this when the wound looked worse. He is just been covering this with a clean Band-Aid. He is given Bactrim a week ago and he is finishing this currently. It is made some improvement in the surrounding erythema per the patient. He does not have a history of nonhealing wounds. No prior history of wounds on his legs that he had difficulty healing. No prior skin issues. 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 Past medical history; A. fib status post ablation and recent cardioversion he is on Xarelto. He has a history of systolic heart failure, cervical radiculopathy, cardiomyopathy and type 2 diabetes as discussed ABI in the right leg was noncompressible on the right 2/20; the biopsy I did on the patient last week was negative for malignancy. Culture grew Pseudomonas and methicillin sensitive staph aureus. He is on cefdinir 300 twice a day. I will have to make this a 10 day course. He put him in compression. He states that the redness pain and erythema are a lot better. I suspect the patient has chronic venous insufficiency probably with a secondary cellulitis 2/27; arrives today with copious amounts of drainage coming out of the wound irritating the skin below the wound area. He only renewed the final 3 days of cefdinir today 3/5; came in for a nurse change 3 days ago. Again a lot of drainage noted. Zinc oxide was applied  unfortunately the drainage appears to a pool then he has a string of superficial open areas extending down into the Achilles area and some just below the wound. The wound itself does not look too bad. He is completed the antibiotics although apparently there was separation from the original 7 with the last 3 days. Culture grew a few MSSA and a few Pseudomonas 3/12; not too much difference over the last week. He came in for a dressing change on Monday by our nursing staff. Necrotic debris again over the wound surface. The entire area looks irritated but nontender and I do not think shows obvious evidence of infection. We have not heard anything about the reflux studies. Also notable than in this diabetic man  we had noncompressible vessels and although I can feel his pulses easily in his feet I will order arterial studies as well. 3/19-Patient had experience more pain has been dressed with a silver alginate, the wounds all have necrotic debris, very friable with easy bleeding with any kind of surface debridement including with gauze and Anasept and with a #3 curette. His vascular appointments unfortunately were all canceled on account of the virus outbreak resulting in studies only being done for emergent cases. His pulses are easily palpable in the lower extremity. He has open areas below the primary wound where he states the drainage which included purulent material also with some blood pooled and caused breakdown. 3/26; the patient was changed to Santyl with [facility] backing last week. This was largely because the silver alginate was sticking to the wound. He is still having quite a bit of pain. He tells [place] that the reflux studies and arterial studies we had attempted to arrange through vein and vascular are not being booked until mid May. Culture that was done last week showed both Serratia moderate and a few methicillin sensitive staph aureus I started him on Bactrim DS 1 p.o. twice  daily on 3/23 for 7 days. He is here for follow-up. 4/3; patient is on Santyl with Hydrofera Blue. Patient complains of pain. Our intake nurse is noted drainage and some odor. He has completed Bactrim recently for Serratia and a few methicillin sensitive staph aureus. After our staff push hard last week we were able to get venous studies as well as arterial studies. His arterial studies showed on the right great toe pressure of 0.86. On the left ABI at 1.47 TBI of 0.87. On both sides waveforms were triphasic VENOUS STUDIES showed reflux in the femoral vein, popliteal vein great saphenous vein at the saphenofemoral junction and great saphenous vein at the proximal thigh. Also noted to have age-indeterminate superficial vein thrombosis involving the small saphenous vein. Notable that the patient is already on Xarelto for atrial fibrillation. 4/9; the patient has been to see vascular surgery and reviewed by Dr. [name]. He was felt to have only minimal superficial venous reflux in the right great greater saphenous vein. For this reason he was not felt to be a candidate for laser ablation of the right greater saphenous vein. A wedge cushion was suggested to keep his leg elevated. He does have deep venous reflux on the right. Culture I did last time showed a combination of Serratia Pseudomonas and methicillin sensitive staph aureus. We have been using Hydrofera Blue to the large wound, Santyl to some of the deep punched out satellite lesions distal to it. There is some improvement [id number]; the application for Apligraf was put out for further review for material that we have resubmitted. He has the deep area on the right posterior calf which is large and then 3 small punched out areas beneath this. In general his wounds look a lot better 4/23; the patient has his large original wound and then 2 punched out satellite lesions below it on the right posterior calf. I was usually able to use Apligraf #1 to cover  the full surface area of the larger wound. We continued with Santyl and Hydrofera Blue on the 2 punched-out areas 5/7; patient has his large original wound and 2 punched out satellite lesions below it in the right posterior calf. Apligraf #2 today. Major improvement in the big wound in terms of wound depth. Punched-out Warren wounds have a very healthy looking  wound bed. 5/21-Patient comes back for his large right posterior calf wound for which she has been an Apligraf #3 today. Wound depth seems to be better, the punched-out wounds appear to have healthy bed with some bleeding 6/4; right posterior calf wound is much better. Apligraf #4 today. The major wound has no wound depth at all. Only 1 of the satellite lesions is still open. The patient has very high blood pressure coming in today with a diastolic blood pressure of 130 after lying there for a few minutes it was down to 110. He states that is running [id number] diastolic at home. He was high last week as well I thought he was on 50 mg 1/2 tablet twice daily of losartan I told him to double up on that however it turns out he is actually on 50 twice daily. Course he is run out of his medications prematurely. He has a follow-up with his primary's office next Wednesday. 6/18; patient's wounds continue to improve. The small area laterally is just about closed and there is continued epithelialization on the area on the posterior calf. 7/2; we applied his last Apligraf last visit. Early the next week there was a lot of purulent looking drainage that I cultured this grew staph and Pseudomonas however when we had him back for the next visit to 3 days later everything looked a lot better. He did not receive systemic antibiotics. Fortunately we did not have to remove the Apligraf. He has had heart trouble this week he was having an ablation apparently they have discovered a clot in his left atrium they have changed all his anticoagulants as well as his  blood pressure medication. 7/16; using Hydrofera Blue. We are making nice progress towards closure. He has not yet ordered his compression stockings he has his measurements 7/23; dimensions are better. He has a new satellite area superiorly. Both wounds cauterized with silver nitrate. 7/30; absolutely no change in the major wound on the posterior part of the right calf. We have been using Hydrofera Blue for a prolonged period of time and really had a nice improvement but over the last few weeks this is stalled. There is no depth. He arrived in clinic today with a new area on the right anterior tibia which apparently was an "ingrown hair". It is clearly an open area. This looks like a wrap injury although he was not really aware of it. 8/31; since the patient was last in clinic he was admitted to hospital from 8/17 through 8/22. He had had multiple falls at home including one off a ladder. He was admitted to hospital with mild COVID-19 viral pneumonia. Noted to be hyperkalemic and in acute renal failure. He has chronic systolic heart failure with ejection fraction of 35%. He arrives back in clinic with the original wound on the posterior aspect of the right lower calf looking a lot better. He has eschared areas from falls and lacerations on his anterior knees bilaterally just below the patella and on the mid part of his tibia bilaterally. He seems to have a small area on the right lateral ankle as well 9/10- Patient comes to clinic with a 2 new wounds one on the left anterior shin and one on the right posterior leg both the left knee and right knee areas are healed up. We have been using Santyl to the left anterior leg and silver alginate to right posterior leg wounds. 9/17; the patient is original wound looks good on the posterior right  calf. He has traumatic areas on the left anterior shin left anterior patellar tendon and the right mid tibia area. Except for the right posterior calf all  required debridement. We have been using Santyl on these areas and silver alginate posteriorly right calf 9/24; the original wound on the posterior calf on the right looks good. This is healthy. There are traumatic wounds in the left anterior shin, left anterior patella in the right mid tibia area are about the same. He has dusky erythema on the left anterior tibia which led me to give him antibiotics last week. This does not look too much different. This is nontender and I wonder if this is all just venous inflammation. 10/2; the patient has 2 open areas on the right posterior calf both of these look good we have been using silver alginate. He has traumatic wounds on the left and right mid tibia areas. The surface of these wounds is still not ready for closure. We have been using Iodoflex. Finally has areas on the left knee. The latter wounds are all traumatic after a fall 10/9. His original wounds of the right posterior calf are superficial and progressing towards closure. His traumatic wounds on the left anterior tibia and right anterior tibia are considerably improved in terms of the wound surface. He asked me about a skin graft in these areas I do not think this is going to be necessary. Change from Iodosorb to [facility]. ooThe area on the left knee is just about closed as well 10/16; his original wound on the right posterior calf very superficial looks healthy. He has more recent traumatic wounds on the left anterior tibia and right anterior tibia both of these have better looking surfaces and measuring smaller. Might be beneficial for an advanced treatment product. The area on his left knee has a small superficial scab and we are going to close that when 10/23; his original wound areas on the right posterior calf continue to improve. The more recently traumatic wounds on the left anterior tibia and right anterior tibia both look better in terms of surfaces. No debridement required.  We have been using Hydrofera Blue. He is approved for Apligraf 11/3; Apligraf #1 applied to the left anterior tibia right anterior tibia and to 2 small areas remaining on the right posterior calf which were part of his initial wound area. 11/19; Apligraf #2. Left anterior tibia is healed. Right anterior tibia much better. Only one small area remains on the right posterior calf which was part of his initial wound area 12/3; the left anterior tibia has opened up again. Hyper granulated nodule. Right anterior tibia is superficial and larger. There is no depth of this. I did not place Apligraf today return to [facility] under compression 12/10; the patient has 2 remaining wounds 1 on the right posterior and the other on the left anterior. Both of these look quite good. No debridement is required we have been using Hydrofera Blue under compression. 12/17; the patient has really not closed over which is disappointing. His wraps were very tight and there may be some wrap injury issues. On the left he has the original wound on the medial mid tibia he has a wrap injury on the lateral mid tibia. On the right his posterior areas are superficial but certainly not closed 12/31; the patient has totally closed on the left. He will transition to his own 20/30 below-knee stocking on the left leg today. Interestingly on the right posterior calf the  2 wounds that he had from last time of closed however there is an additional injury in this same area. Almost looks as though there is subcutaneous fluid/blistering. There is no evidence of cellulitis this does not seem tender 06/24/2019; the patient comes in having been placed in his 20/30 stockings on the left leg last week. He states that he noted blisters break open on Saturday and they had opened into wounds on the anterior tibial area on the left by Monday. The area on the posterior right leg looks satisfactory we are still doing that and compression. We are  using calcium alginate to all wound areas Objective Constitutional Patient is hypertensive.. Pulse regular and within target range for patient.[name] Respirations regular, non-labored and within target range.. Temperature is normal and within the target range for the patient.[name] Appears in no distress. Vitals Time Taken: 1:35 PM, Height: 73 in, Weight: 290 lbs, BMI: 38.3, Temperature: 98.3 F, Pulse: 67 bpm, Respiratory Rate: 18 breaths/min, Blood Pressure: 164/93 mmHg. Respiratory work of breathing is normal. Cardiovascular No evidence of systemic fluid volume overload. Patient has 2-3+ pitting edema and is left calf and pretibial area. Some degree of venous inflammation. General Notes: Wound exam ooStill the superficial area on the posterior right calf I was hoping that would be closed. This appears to be making progress however ooOn the left anterior tibial area 2 superficial areas 1 on the central part of the tibia and 1 medially. There is venous inflammation here/stasis dermatitis but no evidence of cellulitis Integumentary (Hair, Skin) Wound #12 status is Open. Original cause of wound was Gradually Appeared. The wound is located on the Left,Medial Lower Leg. The wound measures 0.6cm length x 1cm width x 0.1cm depth; 0.471cm^2 area and 0.047cm^3 volume. There is Fat Layer (Subcutaneous Tissue) Exposed exposed. There is no tunneling or undermining noted. There is a medium amount of serosanguineous drainage noted. The wound margin is distinct with the outline attached to the wound base. There is large (67-100%) red, pink granulation within the wound bed. There is a small (1-33%) amount of necrotic tissue within the wound bed including Adherent Slough. Wound #6 status is Open. Original cause of wound was Gradually Appeared. The wound is located on the Right,Proximal,Posterior Lower Leg. The wound measures 4.4cm length x 4cm width x 0.1cm depth; 13.823cm^2 area and 1.382cm^3 volume. There is  Fat Layer (Subcutaneous Tissue) Exposed exposed. There is no tunneling or undermining noted. There is a medium amount of serosanguineous drainage noted. The wound margin is flat and intact. There is large (67-100%) red, pink granulation within the wound bed. There is no necrotic tissue within the wound bed. Wound #8R status is Open. Original cause of wound was Gradually Appeared. The wound is located on the Left,Anterior Lower Leg. The wound measures 3.4cm length x 1.8cm width x 0.1cm depth; 4.807cm^2 area and 0.481cm^3 volume. There is Fat Layer (Subcutaneous Tissue) Exposed exposed. There is no tunneling or undermining noted. There is a medium amount of serosanguineous drainage noted. The wound margin is flat and intact. There is large (67-100%) red, pink granulation within the wound bed. There is no necrotic tissue within the wound bed. Assessment Active Problems ICD-10 Non-pressure chronic ulcer of right calf with fat layer exposed Type 2 diabetes mellitus with other skin ulcer Chronic venous hypertension (idiopathic) with inflammation of right lower extremity Laceration without foreign body, right lower leg, subsequent encounter Procedures Wound #12 Pre-procedure diagnosis of Wound #12 is a Venous Leg Ulcer located on the Left,Medial Lower  Leg . There was a Three Layer Compression Therapy Procedure by Kela Millin, RN. Post procedure Diagnosis Wound #12: Same as Pre-Procedure Wound #6 Pre-procedure diagnosis of Wound #6 is a Venous Leg Ulcer located on the Right,Proximal,Posterior Lower Leg . There was a Three Layer Compression Therapy Procedure by Kela Millin, RN. Post procedure Diagnosis Wound #6: Same as Pre-Procedure Wound #8R Pre-procedure diagnosis of Wound #8R is a Venous Leg Ulcer located on the Left,Anterior Lower Leg . There was a Three Layer Compression Therapy Procedure by Kela Millin, RN. Post procedure Diagnosis Wound #8R: Same as  Pre-Procedure Plan Follow-up Appointments: Return Appointment in 1 week. Dressing Change Frequency: Do not change entire dressing for one week. Skin Barriers/Peri-Wound Care: Moisturizing lotion - both legs patient to lotion daily at night. Wound Cleansing: May shower with protection. Primary Wound Dressing: Wound #12 Left,Medial Lower Leg: Calcium Alginate with Silver Wound #6 Right,Proximal,Posterior Lower Leg: Calcium Alginate with Silver Wound #8R Left,Anterior Lower Leg: Calcium Alginate with Silver Edema Control: 3 Layer Compression System - Bilateral Patient to wear own compression stockings - patient to bring in on next wound care appointment. Avoid standing for long periods of time Elevate legs to the level of the heart or above for 30 minutes daily and/or when sitting, a frequency of: - 3-4 times a day. Exercise regularly Support Garment 30-40 mm/Hg pressure to: - Will order Juxtalites-HD for both legs #1 we redressed all the wounds with silver alginate under 3 layer compression bilaterally 2. There was no signs of systemic fluid volume overload the edema in the left leg did not extend above the knee therefore I think all of this was an adequate compression in a man with a significant chronic venous insufficiency. We will order external compression garments for him. Clearly 20/30 stockings would be an adequate Electronic Signature(s) Signed: 07/18/2019 4:19:06 PM By: Linton Ham MD Previous Signature: 06/24/2019 4:35:49 PM Version By: Linton Ham MD Entered By: Linton Ham on 07/18/2019 16:18:04 -------------------------------------------------------------------------------- SuperBill Details Patient Name: Date of Service: [name] 06/24/2019 Medical Record [id number] Patient Account Number: [id number] Date of Birth/Sex: Treating RN: 1956-03-04 (64 y.o. [name] Primary Care Provider: [name] Other Clinician: Referring  Provider: Treating Provider/Extender:Zendayah Hardgrave, [name], [name] in Treatment: 47 Diagnosis Coding ICD-10 Codes Code Description [phone] Non-pressure chronic ulcer of right calf with fat layer exposed E11.622 Type 2 diabetes mellitus with other skin ulcer I87.321 Chronic venous hypertension (idiopathic) with inflammation of right lower extremity S81.811D Laceration without foreign body, right lower leg, subsequent encounter Facility Procedures CPT4: Code [id number] ( Description: [id number] BILATERAL: Application of multi-layer venous compression system; leg below knee), including ankle and foot. Modifier Quantity: 1 Physician Procedures CPT4 Code Description: [id number] - WC PHYS LEVEL 3 - EST PT ICD-10 Diagnosis Description L97.212 Non-pressure chronic ulcer of right calf with fat laye E11.622 Type 2 diabetes mellitus with other skin ulcer I87.321 Chronic venous hypertension  (idiopathic) with inflamma extremity Modifier: r exposed tion of right lo Quantity: 1 wer Electronic Signature(s) Signed: 07/18/2019 4:19:06 PM By: Linton Ham MD Previous Signature: 06/24/2019 4:35:49 PM Version By: Linton Ham MD Entered By: Linton Ham on 07/18/2019 16:16:55

## 2019-06-28 NOTE — Progress Notes (Signed)
[name], [name] ([id number]) Visit Report for 06/24/2019 Arrival Information Details Patient Name: Date of Service: [name], [name] 06/24/2019 2:00 PM Medical Record [id number] Patient Account Number: [id number] Date of Birth/Sex: Treating RN: [birth year] (64 y.o. [name] Primary Care Alaiah Lundy: [name] Other Clinician: Referring Kimball Manske: Treating Roselle Norton/Extender:Robson, [name], [name] in Treatment: 47 Visit Information History Since Last Visit Added or deleted any medications: No Patient Arrived: Ambulatory Any new allergies or adverse reactions: No Arrival Time: 13:33 Had a fall or experienced change in No Accompanied By: alone activities of daily living that may affect Transfer Assistance: None risk of falls: Patient Identification Verified: Yes Signs or symptoms of abuse/neglect since last No Secondary Verification Process Yes visito Completed: Hospitalized since last visit: No Patient Requires Transmission-Based No Implantable device outside of the clinic excluding No Precautions: cellular tissue based products placed in the center Patient Has Alerts: Yes since last visit: Patient Alerts: Patient on Blood Has Dressing in Place as Prescribed: Yes Thinner Has Compression in Place as Prescribed: Yes xarelto Pain Present Now: No R TBI = .86, L TBI = .87 Electronic Signature(s) Signed: 06/28/2019 6:18:44 PM By: Levan Hurst RN, BSN Entered By: Levan Hurst on 06/24/2019 13:33:38 -------------------------------------------------------------------------------- Compression Therapy Details Patient Name: Date of Service: [name] 06/24/2019 2:00 PM Medical Record [id number] Patient Account Number: [id number] Date of Birth/Sex: Treating RN: 23-Sep-1955 (64 y.o. [name] Primary Care Reesa Gotschall: [name] Other Clinician: Referring Elize Pinon: Treating Geofrey Silliman/Extender:Robson,  [name], [name] in Treatment: 47 Compression Therapy Performed for Wound Wound #12 Left,Medial Lower Leg Assessment: Performed By: Clinician Kela Millin, RN Compression Type: Three Layer Post Procedure Diagnosis Same as Pre-procedure Electronic Signature(s) Signed: 06/24/2019 2:58:22 PM By: Kela Millin Entered By: Kela Millin on 06/24/2019 14:01:45 -------------------------------------------------------------------------------- Compression Therapy Details Patient Name: Date of Service: [name], [name] 06/24/2019 2:00 PM Medical Record [id number] Patient Account Number: [id number] Date of Birth/Sex: Treating RN: 12-25-1955 (64 y.o. [name] Primary Care Demerius Podolak: [name] Other Clinician: Referring Siris Hoos: Treating Maira Christon/Extender:Robson, [name], [name] in Treatment: 47 Compression Therapy Performed for Wound Wound #6 Right,Proximal,Posterior Lower Leg Assessment: Performed By: Clinician Kela Millin, RN Compression Type: Three Layer Post Procedure Diagnosis Same as Pre-procedure Electronic Signature(s) Signed: 06/24/2019 2:58:22 PM By: Kela Millin Entered By: Kela Millin on 06/24/2019 14:01:45 -------------------------------------------------------------------------------- Compression Therapy Details Patient Name: Date of Service: [name], [name] 06/24/2019 2:00 PM Medical Record [id number] Patient Account Number: [id number] Date of Birth/Sex: Treating RN: Apr 09, 1956 (64 y.o. [name] Primary Care Fiorela Pelzer: [name] Other Clinician: Referring Bronda Alfred: Treating Alastair Hennes/Extender:Robson, [name], [name] in Treatment: 47 Compression Therapy Performed for Wound Wound #8R Left,Anterior Lower Leg Assessment: Performed By: Clinician Kela Millin, RN Compression Type: Three Layer Post Procedure Diagnosis Same as  Pre-procedure Electronic Signature(s) Signed: 06/24/2019 2:58:22 PM By: Kela Millin Entered By: Kela Millin on 06/24/2019 14:01:45 -------------------------------------------------------------------------------- Lower Extremity Assessment Details Patient Name: Date of Service: [name], [name] 06/24/2019 2:00 PM Medical Record [id number] Patient Account Number: [id number] Date of Birth/Sex: Treating RN: 1956/03/23 (64 y.o. [name] Primary Care Racquel Arkin: [name] Other Clinician: Referring Virginia Francisco: Treating Johneric Mcfadden/Extender:Robson, [name], [name] in Treatment: 47 Edema Assessment Assessed: [Left: No] [Right: No] Edema: [Left: Yes] [Right: Yes] Calf Left: Right: Point of Measurement: 45 cm From Medial Instep 38.5 cm 37.8 cm Ankle Left: Right: Point of Measurement: 13 cm From Medial Instep 23 cm 22 cm Vascular Assessment Pulses: Dorsalis Pedis Palpable: [Left:Yes] [Right:Yes] Electronic Signature(s) Signed: 06/24/2019 2:58:22  PM By: Kela Millin Signed: 06/28/2019 6:18:44 PM By: Levan Hurst RN, BSN Entered By: Kela Millin on 06/24/2019 14:04:21 -------------------------------------------------------------------------------- Multi Wound Chart Details Patient Name: Date of Service: [name] 06/24/2019 2:00 PM Medical Record [id number] Patient Account Number: [id number] Date of Birth/Sex: Treating RN: [birth year] (64 y.o. M) Primary Care Maiko Salais: [name] Other Clinician: Referring Adina Puzzo: Treating Jenean Escandon/Extender:Robson, [name], [name] in Treatment: 47 Vital Signs Height(in): 54 Pulse(bpm): 77 Weight(lbs): 290 Blood Pressure(mmHg): 164/93 Body Mass Index(BMI): 38 Temperature(F): 98.3 Respiratory 18 Rate(breaths/min): Photos: [12:No Photos] [6:No Photos] [8R:No Photos] Wound Location: [12:Left Lower Leg - Medial] [6:Right Lower Leg - Posterior, Left Lower  Leg - Anterior Proximal] Wounding Event: [12:Gradually Appeared] [6:Gradually Appeared] [8R:Gradually Appeared] Primary Etiology: [12:Venous Leg Ulcer] [6:Venous Leg Ulcer] [8R:Venous Leg Ulcer] Comorbid History: [12:Asthma, Sleep Apnea, Arrhythmia, Congestive Heart Failure, Hypertension, Heart Failure, Hypertension, Heart Failure, Hypertension, Type II Diabetes] [6:Asthma, Sleep Apnea, Arrhythmia, Congestive Type II Diabetes] [8R:Asthma, Sleep  Apnea, Arrhythmia, Congestive Type II Diabetes] Date Acquired: [12:06/18/2019] [6:01/06/2019] [8R:02/14/2019] Weeks of Treatment: [12:0] [6:24] [8R:18] Wound Status: [12:Open] [6:Open] [8R:Open] Wound Recurrence: [12:No] [6:No] [8R:Yes] Measurements L x W x D 0.6x1x0.1 [6:4.4x4x0.1] [8R:3.4x1.8x0.1] (cm) Area (cm) : [12:0.471] [6:13.823] [8R:4.807] Volume (cm) : [12:0.047] [6:1.382] [8R:0.481] % Reduction in Area: [12:0.00%] [6:-3417.30%] [8R:23.50%] % Reduction in Volume: 0.00% [6:-3443.60%] [8R:23.40%] Classification: [12:Full Thickness Without Exposed Support Structures Exposed Support Structures Exposed Support Structures] [6:Full Thickness Without] [8R:Full Thickness Without] Exudate Amount: [12:Medium] [6:Medium] [8R:Medium] Exudate Type: [12:Serosanguineous] [6:Serosanguineous] [8R:Serosanguineous] Exudate Color: [12:red, brown] [6:red, brown] [8R:red, brown] Wound Margin: [12:Distinct, outline attached Flat and Intact] [8R:Flat and Intact] Granulation Amount: [12:Large (67-100%)] [6:Large (67-100%)] [8R:Large (67-100%)] Granulation Quality: [12:Red, Pink] [6:Red, Pink] [8R:Red, Pink] Necrotic Amount: [12:Small (1-33%)] [6:None Present (0%)] [8R:None Present (0%)] Exposed Structures: [12:Fat Layer (Subcutaneous Fat Layer (Subcutaneous Fat Layer (Subcutaneous Tissue) Exposed: Yes Fascia: No Tendon: No Muscle: No Joint: No Bone: No None] [6:Tissue) Exposed: Yes Fascia: No Tendon: No Muscle: No Joint: No Bone: No Small (1-33%)]  [8R:Tissue)  Exposed: Yes Fascia: No Tendon: No Muscle: No Joint: No Bone: No Small (1-33%)] Treatment Notes Electronic Signature(s) Signed: 06/24/2019 4:35:49 PM By: Linton Ham MD Entered By: Linton Ham on 06/24/2019 13:58:33 -------------------------------------------------------------------------------- Multi-Disciplinary Care Plan Details Patient Name: Date of Service: [name]. 06/24/2019 2:00 PM Medical Record [id number] Patient Account Number: [id number] Date of Birth/Sex: Treating RN: 05-04-1956 (64 y.o. [name] Primary Care Tunis Gentle: [name] Other Clinician: Referring Kathlynn Swofford: Treating Armando Bukhari/Extender:Robson, [name], [name] in Treatment: 47 Active Inactive Venous Leg Ulcer Nursing Diagnoses: Actual venous Insuffiency (use after diagnosis is confirmed) Knowledge deficit related to disease process and management Goals: Non-invasive venous studies are completed as ordered Date Initiated: 09/17/2018 Date Inactivated: 09/23/2018 Target Resolution Date: 09/23/2018 Goal Status: Met Patient will maintain optimal edema control Date Initiated: 09/17/2018 Target Resolution Date: 07/15/2019 Goal Status: Active Patient/caregiver will verbalize understanding of disease process and disease management Date Initiated: 09/17/2018 Date Inactivated: 09/23/2018 Target Resolution Date: 10/15/2018 Goal Status: Met Interventions: Assess peripheral edema status every visit. Compression as ordered Provide education on venous insufficiency Treatment Activities: Therapeutic compression applied : 09/17/2018 Notes: Electronic Signature(s) Signed: 06/24/2019 2:58:22 PM By: Kela Millin Entered By: Kela Millin on 06/24/2019 13:30:41 -------------------------------------------------------------------------------- Pain Assessment Details Patient Name: Date of Service: [name], [name] 06/24/2019 2:00 PM Medical Record [id number] Patient  Account Number: [id number] Date of Birth/Sex: Treating RN: 19-Aug-1955 (64 y.o. [name] Primary Care Ardene Remley: [name] Other Clinician: Referring Aneta Hendershott: Treating Elray Dains/Extender:Robson, [name], Benjamine Mola  Weeks in Treatment: 47 Active Problems Location of Pain Severity and Description of Pain Patient Has Paino No Site Locations Pain Management and Medication Current Pain Management: Electronic Signature(s) Signed: 06/28/2019 6:18:44 PM By: Levan Hurst RN, BSN Entered By: Levan Hurst on 06/24/2019 13:44:34 -------------------------------------------------------------------------------- Patient/Caregiver Education Details Patient Name: Date of Service: [name], [name] J. 1/8/2021andnbsp2:00 PM Medical Record [id number] Patient Account Number: [id number] Date of Birth/Gender: Treating RN: January 10, 1956 (63 y.o. [name] Primary Care Physician: [name] Other Clinician: Referring Physician: Treating Physician/Extender:Robson, [name], [name] in Treatment: 65 Education Assessment Education Provided To: Patient Education Topics Provided Venous: Electronic Signature(s) Signed: 06/24/2019 2:58:22 PM By: Kela Millin Entered By: Kela Millin on 06/24/2019 13:31:15 -------------------------------------------------------------------------------- Wound Assessment Details Patient Name: Date of Service: [name], [name] 06/24/2019 2:00 PM Medical Record [id number] Patient Account Number: [id number] Date of Birth/Sex: Treating RN: 03/29/1956 (64 y.o. [name] Primary Care Lexia Vandevender: [name] Other Clinician: Referring Brizeyda Holtmeyer: Treating Said Rueb/Extender:Robson, [name], [name] in Treatment: 47 Wound Status Wound Number: 12 Primary Venous Leg Ulcer Etiology: Wound Location: Left Lower Leg - Medial Wound Open Wounding Event: Gradually  Appeared Status: Date Acquired: 06/18/2019 Comorbid Asthma, Sleep Apnea, Arrhythmia, Congestive Weeks Of Treatment: 0 History: Heart Failure, Hypertension, Type II Diabetes Clustered Wound: No Photos Wound Measurements Length: (cm) 0.6 % Reduct Width: (cm) 1 % Reduct Depth: (cm) 0.1 Epitheli Area: (cm) 0.471 Tunneli Volume: (cm) 0.047 Undermi Wound Description Classification: Full Thickness Without Exposed Support Foul Odo Structures Slough/F Wound Distinct, outline attached Margin: Exudate Medium Amount: Exudate Serosanguineous Type: Exudate red, brown Color: Wound Bed Granulation Amount: Large (67-100%) Granulation Quality: Red, Pink Fascia E Necrotic Amount: Small (1-33%) Fat Laye Necrotic Quality: Adherent Slough Tendon E Muscle E Joint Ex Bone Exp r After Cleansing: No ibrino Yes Exposed Structure xposed: No r (Subcutaneous Tissue) Exposed: Yes xposed: No xposed: No posed: No osed: No ion in Area: 0% ion in Volume: 0% alization: None ng: No ning: No Electronic Signature(s) Signed: 06/28/2019 3:22:35 PM By: [name] EMT/HBOT Signed: 06/28/2019 6:18:44 PM By: Levan Hurst RN, BSN Entered By: [name] on 06/28/2019 14:18:58 -------------------------------------------------------------------------------- Wound Assessment Details Patient Name: Date of Service: [name] 06/24/2019 2:00 PM Medical Record [id number] Patient Account Number: [id number] Date of Birth/Sex: Treating RN: January 04, 1956 (64 y.o. [name] Primary Care Aiyannah Fayad: [name] Other Clinician: Referring Richel Millspaugh: Treating Kasheena Sambrano/Extender:Robson, [name], [name] in Treatment: 47 Wound Status Wound Number: 6 Primary Venous Leg Ulcer Etiology: Wound Location: Right Lower Leg - Posterior, Proximal Wound Open Status: Wounding Event: Gradually Appeared Comorbid Asthma, Sleep Apnea, Arrhythmia, Congestive Date Acquired:  01/06/2019 History: Heart Failure, Hypertension, Type II Diabetes Weeks Of Treatment: 24 Clustered Wound: No Photos Wound Measurements Length: (cm) 4.4 % Reduct Width: (cm) 4 % Reduct Depth: (cm) 0.1 Epitheli Area: (cm) 13.823 Tunneli Volume: (cm) 1.382 Undermi Wound Description Full Thickness Without Exposed Support Foul Odo Classification: Structures Slough/F Wound Flat and Intact Margin: Exudate Exudate Medium Amount: Exudate Serosanguineous Type: Exudate red, brown Color: Wound Bed Granulation Amount: Large (67-100%) Granulation Quality: Red, Pink Fascia Exp Necrotic Amount: None Present (0%) Fat Layer Tendon Exp Muscle Exp Joint Expo Bone Expos r After Cleansing: No ibrino No Exposed Structure osed: No (Subcutaneous Tissue) Exposed: Yes osed: No osed: No sed: No ed: No ion in Area: -3417.3% ion in Volume: -3443.6% alization: Small (1-33%) ng: No ning: No Electronic Signature(s) Signed: 06/28/2019 3:22:35 PM By: [name] EMT/HBOT Signed: 06/28/2019 6:18:44 PM By: Levan Hurst RN,  BSN Entered By: [name] on 06/28/2019 14:23:13 -------------------------------------------------------------------------------- Wound Assessment Details Patient Name: Date of Service: [name], [name] 06/24/2019 2:00 PM Medical Record [id number] Patient Account Number: [id number] Date of Birth/Sex: Treating RN: 02/21/1956 (64 y.o. [name] Primary Care Jillaine Waren: [name] Other Clinician: Referring Aeva Posey: Treating Saria Haran/Extender:Robson, [name], [name] in Treatment: 47 Wound Status Wound Number: [id number] Primary Venous Leg Ulcer Etiology: Wound Location: Left Lower Leg - Anterior Wound Open Wounding Event: Gradually Appeared Status: Date Acquired: 02/14/2019 Comorbid Asthma, Sleep Apnea, Arrhythmia, Congestive Weeks Of Treatment: 18 History: Heart Failure, Hypertension, Type II Diabetes Clustered Wound:  No Photos Wound Measurements Length: (cm) 3.4 % Reductio Width: (cm) 1.8 % Reductio Depth: (cm) 0.1 Epitheli Area: (cm) 4.807 Tunneli Volume: (cm) 0.481 Undermi Wound Description Classification: Full Thickness Without Exposed Support Foul Odo Structures Slough/F Wound Flat and Intact Margin: Exudate Medium Amount: Exudate Serosanguineous Type: Exudate red, brown Color: Wound Bed Granulation Amount: Large (67-100%) Granulation Quality: Red, Pink Fascia E Necrotic Amount: None Present (0%) Fat Laye Tendon E Muscle E Joint Ex Bone Exp r After Cleansing: No ibrino No Exposed Structure xposed: No r (Subcutaneous Tissue) Exposed: Yes xposed: No xposed: No posed: No osed: No n in Area: 23.5% n in Volume: 23.4% alization: Small (1-33%) ng: No ning: No Electronic Signature(s) Signed: 06/28/2019 3:22:35 PM By: [name] EMT/HBOT Signed: 06/28/2019 6:18:44 PM By: Levan Hurst RN, BSN Entered By: [name] on 06/28/2019 14:19:23 -------------------------------------------------------------------------------- Vitals Details Patient Name: Date of Service: [name]. 06/24/2019 2:00 PM Medical Record [id number] Patient Account Number: [id number] Date of Birth/Sex: Treating RN: 1955/12/29 (64 y.o. [name] Primary Care Yolonda Purtle: [name] Other Clinician: Referring Argil Mahl: Treating Whittney Steenson/Extender:Robson, [name], [name] in Treatment: 47 Vital Signs Time Taken: 13:35 Temperature (F): 98.3 Height (in): 73 Pulse (bpm): 67 Weight (lbs): 290 Respiratory Rate (breaths/min): 18 Body Mass Index (BMI): 38.3 Blood Pressure (mmHg): 164/93 Reference Range: 80 - 120 mg / dl Electronic Signature(s) Signed: 06/28/2019 6:18:44 PM By: Levan Hurst RN, BSN Entered By: Levan Hurst on 06/24/2019 13:44:27

## 2019-06-30 ENCOUNTER — Encounter (HOSPITAL_BASED_OUTPATIENT_CLINIC_OR_DEPARTMENT_OTHER): Payer: BC Managed Care – PPO | Admitting: Internal Medicine

## 2019-06-30 ENCOUNTER — Other Ambulatory Visit: Payer: Self-pay

## 2019-06-30 DIAGNOSIS — I110 Hypertensive heart disease with heart failure: Secondary | ICD-10-CM | POA: Diagnosis not present

## 2019-06-30 DIAGNOSIS — I87311 Chronic venous hypertension (idiopathic) with ulcer of right lower extremity: Secondary | ICD-10-CM | POA: Diagnosis not present

## 2019-06-30 DIAGNOSIS — L97212 Non-pressure chronic ulcer of right calf with fat layer exposed: Secondary | ICD-10-CM | POA: Diagnosis not present

## 2019-06-30 DIAGNOSIS — L97819 Non-pressure chronic ulcer of other part of right lower leg with unspecified severity: Secondary | ICD-10-CM | POA: Diagnosis not present

## 2019-06-30 DIAGNOSIS — S81802A Unspecified open wound, left lower leg, initial encounter: Secondary | ICD-10-CM | POA: Diagnosis not present

## 2019-06-30 DIAGNOSIS — I872 Venous insufficiency (chronic) (peripheral): Secondary | ICD-10-CM | POA: Diagnosis not present

## 2019-06-30 DIAGNOSIS — I87313 Chronic venous hypertension (idiopathic) with ulcer of bilateral lower extremity: Secondary | ICD-10-CM | POA: Diagnosis not present

## 2019-06-30 DIAGNOSIS — J45909 Unspecified asthma, uncomplicated: Secondary | ICD-10-CM | POA: Diagnosis not present

## 2019-06-30 DIAGNOSIS — I4891 Unspecified atrial fibrillation: Secondary | ICD-10-CM | POA: Diagnosis not present

## 2019-06-30 DIAGNOSIS — Z8616 Personal history of COVID-19: Secondary | ICD-10-CM | POA: Diagnosis not present

## 2019-06-30 DIAGNOSIS — I429 Cardiomyopathy, unspecified: Secondary | ICD-10-CM | POA: Diagnosis not present

## 2019-06-30 DIAGNOSIS — I5022 Chronic systolic (congestive) heart failure: Secondary | ICD-10-CM | POA: Diagnosis not present

## 2019-06-30 DIAGNOSIS — E11622 Type 2 diabetes mellitus with other skin ulcer: Secondary | ICD-10-CM | POA: Diagnosis not present

## 2019-06-30 DIAGNOSIS — L97822 Non-pressure chronic ulcer of other part of left lower leg with fat layer exposed: Secondary | ICD-10-CM | POA: Diagnosis not present

## 2019-06-30 DIAGNOSIS — G4730 Sleep apnea, unspecified: Secondary | ICD-10-CM | POA: Diagnosis not present

## 2019-06-30 DIAGNOSIS — M5412 Radiculopathy, cervical region: Secondary | ICD-10-CM | POA: Diagnosis not present

## 2019-06-30 DIAGNOSIS — Z7901 Long term (current) use of anticoagulants: Secondary | ICD-10-CM | POA: Diagnosis not present

## 2019-06-30 NOTE — Progress Notes (Addendum)
GODFREY, TRITSCHLER ([id number]) Visit Report for 06/30/2019 Arrival Information Details Patient Name: Date of Service: RAINIER, FEUERBORN 06/30/2019 1:00 PM Medical Record [id number] Patient Account Number: [id number] Date of Birth/Sex: Treating RN: [birth year] (63 y.o. [name]) Carlene Coria Primary Care Madsen Riddle: [name] Other Clinician: Referring Michaelanthony Kempton: Treating Prudence Heiny/Extender:Robson, [name], [name] in Treatment: 89 Visit Information History Since Last Visit All ordered tests and consults were completed: No Patient Arrived: Ambulatory Added or deleted any medications: No Arrival Time: 13:22 Any new allergies or adverse reactions: No Accompanied By: self Had a fall or experienced change in No Transfer Assistance: None activities of daily living that may affect Patient Identification Verified: Yes risk of falls: Secondary Verification Process Yes Signs or symptoms of abuse/neglect since last No Completed: visito Patient Requires Transmission- No Hospitalized since last visit: No Based Precautions: Implantable device outside of the clinic excluding No Patient Has Alerts: Yes Patient on Blood cellular tissue based products placed in the center Patient Alerts: since last visit: Thinner Has Dressing in Place as Prescribed: Yes xarelto R TBI = .86, L TBI Has Compression in Place as Prescribed: Yes = .87 Pain Present Now: No Electronic Signature(s) Signed: 06/30/2019 6:24:40 PM By: Carlene Coria RN Entered By: Carlene Coria on 06/30/2019 13:23:06 -------------------------------------------------------------------------------- Compression Therapy Details Patient Name: Date of Service: BRIA, PORTALES 06/30/2019 1:00 PM Medical Record [id number] Patient Account Number: [id number] Date of Birth/Sex: Treating RN: [birth year] (64 y.o. [name] Primary Care Blakelyn Dinges: [name] Other Clinician: Referring Myrene Bougher: Treating  Armenta Erskin/Extender:Robson, [name], [name] in Treatment: 48 Compression Therapy Performed for Wound Wound #6 Right,Proximal,Posterior Lower Leg Assessment: Performed By: Clinician Levan Hurst, RN Compression Type: Three Layer Post Procedure Diagnosis Same as Pre-procedure Electronic Signature(s) Signed: 06/30/2019 6:31:26 PM By: Levan Hurst RN, BSN Entered By: Levan Hurst on 06/30/2019 13:55:42 -------------------------------------------------------------------------------- Compression Therapy Details Patient Name: Date of Service: [name] 06/30/2019 1:00 PM Medical Record [id number] Patient Account Number: [id number] Date of Birth/Sex: Treating RN: 1955/11/08 (64 y.o. [name] Primary Care Nakul Avino: [name] Other Clinician: Referring Royetta Probus: Treating Merrel Crabbe/Extender:Robson, [name], [name] in Treatment: 48 Compression Therapy Performed for Wound Wound #8R Left,Anterior Lower Leg Assessment: Performed By: Clinician Levan Hurst, RN Compression Type: Three Layer Post Procedure Diagnosis Same as Pre-procedure Electronic Signature(s) Signed: 06/30/2019 6:31:26 PM By: Levan Hurst RN, BSN Entered By: Levan Hurst on 06/30/2019 13:55:42 -------------------------------------------------------------------------------- Encounter Discharge Information Details Patient Name: Date of Service: [name] 06/30/2019 1:00 PM Medical Record [id number] Patient Account Number: [id number] Date of Birth/Sex: Treating RN: Feb 20, 1956 (64 y.o. [name] Primary Care Marcene Laskowski: [name] Other Clinician: Referring Katrisha Segall: Treating Takerra Lupinacci/Extender:Robson, [name], [name] in Treatment: 48 Encounter Discharge Information Items Discharge Condition: Stable Ambulatory Status: Ambulatory Discharge Destination: Home Transportation: Private Auto Accompanied By:  self Schedule Follow-up Appointment: Yes Clinical Summary of Care: Patient Declined Electronic Signature(s) Signed: 06/30/2019 6:38:38 PM By: Baruch Gouty RN, BSN Entered By: Baruch Gouty on 06/30/2019 14:20:31 -------------------------------------------------------------------------------- Lower Extremity Assessment Details Patient Name: Date of Service: RC, AMISON 06/30/2019 1:00 PM Medical Record [id number] Patient Account Number: [id number] Date of Birth/Sex: Treating RN: [birth year] (63 y.o. [name]) Carlene Coria Primary Care Vyron Fronczak: [name] Other Clinician: Referring Surabhi Gadea: Treating Tula Schryver/Extender:Robson, [name], [name] in Treatment: 48 Edema Assessment Assessed: [Left: No] [Right: No] Edema: [Left: Yes] [Right: Yes] Calf Left: Right: Point of Measurement: 45 cm From Medial Instep 38.5 cm 37.8 cm Ankle Left: Right: Point of Measurement: 13 cm From Medial Instep  23 cm 22 cm Electronic Signature(s) Signed: 06/30/2019 6:24:40 PM By: Carlene Coria RN Entered By: Carlene Coria on 06/30/2019 13:24:09 -------------------------------------------------------------------------------- [place] Details Patient Name: Date of Service: [name] 06/30/2019 1:00 PM Medical Record [id number] Patient Account Number: [id number] Date of Birth/Sex: Treating RN: 23-Dec-1955 (64 y.o. [name] Primary Care Hagop Mccollam: [name] Other Clinician: Referring Yaniel Limbaugh: Treating Riddick Nuon/Extender:Robson, [name], [name] in Treatment: 48 Active Inactive Venous Leg Ulcer Nursing Diagnoses: Actual venous Insuffiency (use after diagnosis is confirmed) Knowledge deficit related to disease process and management Goals: Non-invasive venous studies are completed as ordered Date Initiated: 09/17/2018 Date Inactivated: 09/23/2018 Target Resolution Date: 09/23/2018 Goal Status: Met Patient will  maintain optimal edema control Date Initiated: 09/17/2018 Target Resolution Date: 07/15/2019 Goal Status: Active Patient/caregiver will verbalize understanding of disease process and disease management Date Initiated: 09/17/2018 Date Inactivated: 09/23/2018 Target Resolution Date: 10/15/2018 Goal Status: Met Interventions: Assess peripheral edema status every visit. Compression as ordered Provide education on venous insufficiency Treatment Activities: Therapeutic compression applied : 09/17/2018 Notes: Electronic Signature(s) Signed: 06/30/2019 6:31:26 PM By: Levan Hurst RN, BSN Entered By: Levan Hurst on 06/30/2019 13:46:15 -------------------------------------------------------------------------------- Pain Assessment Details Patient Name: Date of Service: [name] 06/30/2019 1:00 PM Medical Record [id number] Patient Account Number: [id number] Date of Birth/Sex: Treating RN: [birth year] (63 y.o. [name]) Carlene Coria Primary Care Chukwuemeka Artola: [name] Other Clinician: Referring Ioan Landini: Treating Lodema Parma/Extender:Robson, [name], [name] in Treatment: 48 Active Problems Location of Pain Severity and Description of Pain Patient Has Paino No Site Locations Pain Management and Medication Current Pain Management: Electronic Signature(s) Signed: 06/30/2019 6:24:40 PM By: Carlene Coria RN Entered By: Carlene Coria on 06/30/2019 13:23:58 -------------------------------------------------------------------------------- Patient/Caregiver Education Details Patient Name: Date of Service: Zabriskie, Christ J. 1/14/2021andnbsp1:00 PM Medical Record [id number] Patient Account Number: [id number] Date of Birth/Gender: Treating RN: [birth year] (63 y.o. [name] Primary Care Physician: [name] Other Clinician: Referring Physician: Treating Physician/Extender:Robson, [name], [name] in Treatment: 59 Education  Assessment Education Provided To: Patient Education Topics Provided Wound/Skin Impairment: Methods: Explain/Verbal Responses: State content correctly [employer]) Signed: 06/30/2019 6:31:26 PM By: Levan Hurst RN, BSN Entered By: Levan Hurst on 06/30/2019 13:46:33 -------------------------------------------------------------------------------- Wound Assessment Details Patient Name: Date of Service: [name] 06/30/2019 1:00 PM Medical Record [id number] Patient Account Number: [id number] Date of Birth/Sex: Treating RN: 07-Apr-1956 (63 y.o. [name]) Carlene Coria Primary Care Micael Barb: [name] Other Clinician: Referring Julianne Chamberlin: Treating Jeane Cashatt/Extender:Robson, [name], [name] in Treatment: 48 Wound Status Wound Number: 12 Primary Venous Leg Ulcer Etiology: Wound Location: Left Lower Leg - Medial Wound Healed - Epithelialized Wounding Event: Gradually Appeared Status: Date Acquired: 06/18/2019 Comorbid Asthma, Sleep Apnea, Arrhythmia, Weeks Of Treatment: 0 History: Congestive Heart Failure, Hypertension, Type Clustered Wound: No II Diabetes Photos Wound Measurements Length: (cm) 0 % Reduct Width: (cm) 0 % Reduct Depth: (cm) 0 Epitheli Area: (cm) 0 Tunneli Volume: (cm) 0 Undermi Wound Description Classification: Full Thickness Without Exposed Support Foul Odo Structures Slough/F Wound Distinct, outline attached Margin: Exudate None Present Amount: Wound Bed Granulation Amount: None Present (0%) Necrotic Amount: None Present (0%) Fascia E Fat Laye Tendon E Muscle E Joint Ex Bone Exp r After Cleansing: No ibrino No Exposed Structure xposed: No r (Subcutaneous Tissue) Exposed: No xposed: No xposed: No posed: No osed: No ion in Area: 100% ion in Volume: 100% alization: Large (67-100%) ng: No ning: No Electronic Signature(s) Signed: 07/05/2019 4:37:08 PM By: Mikeal Hawthorne EMT/HBOT Signed: 07/06/2019  5:42:11 PM By: Dolores Lory,  Carrie RN Previous Signature: 06/30/2019 6:24:40 PM Version By: Carlene Coria RN Entered By: Mikeal Hawthorne on 07/04/2019 16:05:47 -------------------------------------------------------------------------------- Wound Assessment Details Patient Name: Date of Service: DAMARIOUS, HOLTSCLAW 06/30/2019 1:00 PM Medical Record [id number] Patient Account Number: [id number] Date of Birth/Sex: Treating RN: 11-20-1955 (63 y.o. [name]) Carlene Coria Primary Care Shunta Mclaurin: [name] Other Clinician: Referring Brantleigh Mifflin: Treating Tamiki Kuba/Extender:Robson, [name], [name] in Treatment: 48 Wound Status Wound Number: 6 Primary Venous Leg Ulcer Etiology: Wound Location: Right Lower Leg - Posterior, Proximal Wound Open Status: Wounding Event: Gradually Appeared Comorbid Asthma, Sleep Apnea, Arrhythmia, Date Acquired: 01/06/2019 History: Congestive Heart Failure, Hypertension, Type Weeks Of Treatment: 25 II Diabetes Clustered Wound: No Photos Wound Measurements Length: (cm) 3 % Reduct Width: (cm) 1.5 % Reduct Depth: (cm) 0.1 Epitheli Area: (cm) 3.534 Tunneli Volume: (cm) 0.353 Undermi Wound Description Classification: Full Thickness Without Exposed Support Foul Od Structures Slough/ Wound Flat and Intact Margin: Exudate Medium Amount: Exudate Serosanguineous Type: Exudate red, brown Color: Wound Bed Granulation Amount: Large (67-100%) Granulation Quality: Red, Pink Fascia Exp Necrotic Amount: None Present (0%) Fat Layer Tendon Exp Muscle Exp Joint Expo Bone Expos or After Cleansing: No Fibrino No Exposed Structure osed: No (Subcutaneous Tissue) Exposed: Yes osed: No osed: No sed: No ed: No ion in Area: -799.2% ion in Volume: -805.1% alization: Small (1-33%) ng: No ning: No Treatment Notes Wound #6 (Right, Proximal, Posterior Lower Leg) 2. Periwound Care Moisturizing lotion 3. Primary Dressing Applied Hydrofera Blue 4.  Secondary Dressing ABD Pad Dry Gauze 6. Support Layer Applied 3 layer compression wrap Electronic Signature(s) Signed: 07/05/2019 4:37:08 PM By: Mikeal Hawthorne EMT/HBOT Signed: 07/06/2019 5:42:11 PM By: Carlene Coria RN Previous Signature: 06/30/2019 6:24:40 PM Version By: Carlene Coria RN Entered By: Mikeal Hawthorne on 07/04/2019 16:03:51 -------------------------------------------------------------------------------- Wound Assessment Details Patient Name: Date of Service: [name] 06/30/2019 1:00 PM Medical Record [id number] Patient Account Number: [id number] Date of Birth/Sex: Treating RN: [birth year] (63 y.o. [name]) Carlene Coria Primary Care Thurza Kwiecinski: [name] Other Clinician: Referring Juanmanuel Marohl: Treating [name] Mcnamee/Extender:Robson, [name], [name] in Treatment: 48 Wound Status Wound Number: 8R Primary Venous Leg Ulcer Etiology: Wound Location: Left Lower Leg - Anterior Wound Open Wounding Event: Gradually Appeared Status: Date Acquired: 02/14/2019 Comorbid Asthma, Sleep Apnea, Arrhythmia, Weeks Of Treatment: 19 History: Congestive Heart Failure, Hypertension, Type Clustered Wound: No II Diabetes Photos Wound Measurements Length: (cm) 2 % Reduct Width: (cm) 0.6 % Reduct Depth: (cm) 0.1 Epitheli Area: (cm) 0.942 Tunneli Volume: (cm) 0.094 Undermi Wound Description Classification: Full Thickness Without Exposed Support Foul Od Structures Slough/ Wound Flat and Intact Margin: Exudate Medium Amount: Exudate Serosanguineous Type: Exudate red, brown Color: Wound Bed Granulation Amount: Large (67-100%) Granulation Quality: Red, Pink Fascia E Necrotic Amount: None Present (0%) Fat Laye Tendon E Muscle E Joint Ex Bone Exp or After Cleansing: No Fibrino No Exposed Structure xposed: No r (Subcutaneous Tissue) Exposed: Yes xposed: No xposed: No posed: No osed: No ion in Area: 85% ion in Volume: 85% alization: Small  (1-33%) ng: No ning: No Treatment Notes Wound #8R (Left, Anterior Lower Leg) 2. Periwound Care Moisturizing lotion 3. Primary Dressing Applied Hydrofera Blue 4. Secondary Dressing ABD Pad Dry Gauze 6. Support Layer Applied 3 layer compression wrap Electronic Signature(s) Signed: 07/05/2019 4:37:08 PM By: Mikeal Hawthorne EMT/HBOT Signed: 07/06/2019 5:42:11 PM By: Carlene Coria RN Previous Signature: 06/30/2019 6:24:40 PM Version By: Carlene Coria RN Entered By: Mikeal Hawthorne on 07/04/2019 16:04:17 -------------------------------------------------------------------------------- Vitals Details Patient Name: Date of Service: [name]  J. 06/30/2019 1:00 PM Medical Record [id number] Patient Account Number: [id number] Date of Birth/Sex: Treating RN: May 16, 1956 (63 y.o. [name]) Carlene Coria Primary Care Declan Adamson: [name] Other Clinician: Referring Lynnex Fulp: Treating Leoncio Hansen/Extender:Robson, [name], [name] in Treatment: 48 Vital Signs Time Taken: 13:23 Temperature (F): 98.6 Height (in): 73 Pulse (bpm): 78 Weight (lbs): 290 Respiratory Rate (breaths/min): 18 Body Mass Index (BMI): 38.3 Blood Pressure (mmHg): 160/90 Reference Range: 80 - 120 mg / dl Electronic Signature(s) Signed: 06/30/2019 6:24:40 PM By: Carlene Coria RN Entered By: Carlene Coria on 06/30/2019 13:23:52

## 2019-07-01 NOTE — Progress Notes (Signed)
[name], [name] ([name]) Visit Report for 06/30/2019 HPI Details Patient Name: Date of Service: COLBERT, BUREK 06/30/2019 1:00 PM Medical Record [name] Patient Account Number: [id number] Date of Birth/Sex: Treating RN: 09/15/1955 (64 y.o. M) Primary Care Provider: [name] Other Clinician: Referring Provider: Treating Provider/Extender:Layson Bertsch, [name], [name] in Treatment: 28 History of Present Illness HPI Description: ADMISSION 07/29/2018 Mr. Waltman is a 63 year old man with either prediabetes or diabetes he is on glipizide. In late October to November 2019 he noted a scabbed area on the back of his right calf. He picked this off a few times but it would not heal. He saw his primary physician on 12/5. It was felt at that time that this may actually heal on its own with conservative management. The next visit was on 07/20/2018 noted the area was a lot worse and arranged for his treatment here. He has been topical antibiotics like Neosporin although he stopped using this when the wound looked worse. He is just been covering this with a clean Band-Aid. He is given Bactrim a week ago and he is finishing this currently. It is made some improvement in the surrounding erythema per the patient. He does not have a history of nonhealing wounds. No prior history of wounds on his legs that he had difficulty healing. No prior skin issues. 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 Past medical history; A. fib status post ablation and recent cardioversion he is on Xarelto. He has a history of systolic heart failure, cervical radiculopathy, cardiomyopathy and type 2 diabetes as discussed ABI in the right leg was noncompressible on the right 2/20; the biopsy I did on the patient last week was negative for malignancy. Culture grew Pseudomonas and methicillin sensitive staph aureus. He is on cefdinir 300 twice a day. I will have to make this a 10 day  course. He put him in compression. He states that the redness pain and erythema are a lot better. I suspect the patient has chronic venous insufficiency probably with a secondary cellulitis 2/27; arrives today with copious amounts of drainage coming out of the wound irritating the skin below the wound area. He only renewed the final 3 days of cefdinir today 3/5; came in for a nurse change 3 days ago. Again a lot of drainage noted. Zinc oxide was applied unfortunately the drainage appears to a pool then he has a string of superficial open areas extending down into the Achilles area and some just below the wound. The wound itself does not look too bad. He is completed the antibiotics although apparently there was separation from the original 7 with the last 3 days. Culture grew a few MSSA and a few Pseudomonas 3/12; not too much difference over the last week. He came in for a dressing change on Monday by our nursing staff. Necrotic debris again over the wound surface. The entire area looks irritated but nontender and I do not think shows obvious evidence of infection. We have not heard anything about the reflux studies. Also notable than in this diabetic man we had noncompressible vessels and although I can feel his pulses easily in his feet I will order arterial studies as well. 3/19-Patient had experience more pain has been dressed with a silver alginate, the wounds all have necrotic debris, very friable with easy bleeding with any kind of surface debridement including with gauze and Anasept and with a #3 curette. His vascular appointments unfortunately were all canceled on account of  the virus outbreak resulting in studies only being done for emergent cases. His pulses are easily palpable in the lower extremity. He has open areas below the primary wound where he states the drainage which included purulent material also with some blood pooled and caused breakdown. 3/26; the patient was changed  to Santyl with [facility] backing last week. This was largely because the silver alginate was sticking to the wound. He is still having quite a bit of pain. He tells [place] that the reflux studies and arterial studies we had attempted to arrange through vein and vascular are not being booked until mid May. Culture that was done last week showed both Serratia moderate and a few methicillin sensitive staph aureus I started him on Bactrim DS 1 p.o. twice daily on 3/23 for 7 days. He is here for follow-up. 4/3; patient is on Santyl with Hydrofera Blue. Patient complains of pain. Our intake nurse is noted drainage and some odor. He has completed Bactrim recently for Serratia and a few methicillin sensitive staph aureus. After our staff push hard last week we were able to get venous studies as well as arterial studies. His arterial studies showed on the right great toe pressure of 0.86. On the left ABI at 1.47 TBI of 0.87. On both sides waveforms were triphasic VENOUS STUDIES showed reflux in the femoral vein, popliteal vein great saphenous vein at the saphenofemoral junction and great saphenous vein at the proximal thigh. Also noted to have age-indeterminate superficial vein thrombosis involving the small saphenous vein. Notable that the patient is already on Xarelto for atrial fibrillation. 4/9; the patient has been to see vascular surgery and reviewed by Dr. [name]. He was felt to have only minimal superficial venous reflux in the right great greater saphenous vein. For this reason he was not felt to be a candidate for laser ablation of the right greater saphenous vein. A wedge cushion was suggested to keep his leg elevated. He does have deep venous reflux on the right. Culture I did last time showed a combination of Serratia Pseudomonas and methicillin sensitive staph aureus. We have been using Hydrofera Blue to the large wound, Santyl to some of the deep punched out satellite lesions distal to it.  There is some improvement [id number]; the application for Apligraf was put out for further review for material that we have resubmitted. He has the deep area on the right posterior calf which is large and then 3 small punched out areas beneath this. In general his wounds look a lot better 4/23; the patient has his large original wound and then 2 punched out satellite lesions below it on the right posterior calf. I was usually able to use Apligraf #1 to cover the full surface area of the larger wound. We continued with Santyl and Hydrofera Blue on the 2 punched-out areas 5/7; patient has his large original wound and 2 punched out satellite lesions below it in the right posterior calf. Apligraf #2 today. Major improvement in the big wound in terms of wound depth. Punched-out [place] wounds have a very healthy looking wound bed. 5/21-Patient comes back for his large right posterior calf wound for which she has been an Apligraf #3 today. Wound depth seems to be better, the punched-out wounds appear to have healthy bed with some bleeding 6/4; right posterior calf wound is much better. Apligraf #4 today. The major wound has no wound depth at all. Only 1 of the satellite lesions is still open. The patient has very  high blood pressure coming in today with a diastolic blood pressure of 130 after lying there for a few minutes it was down to 110. He states that is running [id number] diastolic at home. He was high last week as well I thought he was on 50 mg 1/2 tablet twice daily of losartan I told him to double up on that however it turns out he is actually on 50 twice daily. Course he is run out of his medications prematurely. He has a follow-up with his primary's office next Wednesday. 6/18; patient's wounds continue to improve. The small area laterally is just about closed and there is continued epithelialization on the area on the posterior calf. 7/2; we applied his last Apligraf last visit. Early the next week  there was a lot of purulent looking drainage that I cultured this grew staph and Pseudomonas however when we had him back for the next visit to 3 days later everything looked a lot better. He did not receive systemic antibiotics. Fortunately we did not have to remove the Apligraf. He has had heart trouble this week he was having an ablation apparently they have discovered a clot in his left atrium they have changed all his anticoagulants as well as his blood pressure medication. 7/16; using Hydrofera Blue. We are making nice progress towards closure. He has not yet ordered his compression stockings he has his measurements 7/23; dimensions are better. He has a new satellite area superiorly. Both wounds cauterized with silver nitrate. 7/30; absolutely no change in the major wound on the posterior part of the right calf. We have been using Hydrofera Blue for a prolonged period of time and really had a nice improvement but over the last few weeks this is stalled. There is no depth. He arrived in clinic today with a new area on the right anterior tibia which apparently was an "ingrown hair". It is clearly an open area. This looks like a wrap injury although he was not really aware of it. 8/31; since the patient was last in clinic he was admitted to hospital from 8/17 through 8/22. He had had multiple falls at home including one off a ladder. He was admitted to hospital with mild COVID-19 viral pneumonia. Noted to be hyperkalemic and in acute renal failure. He has chronic systolic heart failure with ejection fraction of 35%. He arrives back in clinic with the original wound on the posterior aspect of the right lower calf looking a lot better. He has eschared areas from falls and lacerations on his anterior knees bilaterally just below the patella and on the mid part of his tibia bilaterally. He seems to have a small area on the right lateral ankle as well 9/10- Patient comes to clinic with a 2 new  wounds one on the left anterior shin and one on the right posterior leg both the left knee and right knee areas are healed up. We have been using Santyl to the left anterior leg and silver alginate to right posterior leg wounds. 9/17; the patient is original wound looks good on the posterior right calf. He has traumatic areas on the left anterior shin left anterior patellar tendon and the right mid tibia area. Except for the right posterior calf all required debridement. We have been using Santyl on these areas and silver alginate posteriorly right calf 9/24; the original wound on the posterior calf on the right looks good. This is healthy. There are traumatic wounds in the left anterior shin, left anterior  patella in the right mid tibia area are about the same. He has dusky erythema on the left anterior tibia which led me to give him antibiotics last week. This does not look too much different. This is nontender and I wonder if this is all just venous inflammation. 10/2; the patient has 2 open areas on the right posterior calf both of these look good we have been using silver alginate. He has traumatic wounds on the left and right mid tibia areas. The surface of these wounds is still not ready for closure. We have been using Iodoflex. Finally has areas on the left knee. The latter wounds are all traumatic after a fall 10/9. His original wounds of the right posterior calf are superficial and progressing towards closure. His traumatic wounds on the left anterior tibia and right anterior tibia are considerably improved in terms of the wound surface. He asked me about a skin graft in these areas I do not think this is going to be necessary. Change from Iodosorb to [facility]. The area on the left knee is just about closed as well 10/16; his original wound on the right posterior calf very superficial looks healthy. He has more recent traumatic wounds on the left anterior tibia and right anterior  tibia both of these have better looking surfaces and measuring smaller. Might be beneficial for an advanced treatment product. The area on his left knee has a small superficial scab and we are going to close that when 10/23; his original wound areas on the right posterior calf continue to improve. The more recently traumatic wounds on the left anterior tibia and right anterior tibia both look better in terms of surfaces. No debridement required. We have been using Hydrofera Blue. He is approved for Apligraf 11/3; Apligraf #1 applied to the left anterior tibia right anterior tibia and to 2 small areas remaining on the right posterior calf which were part of his initial wound area. 11/19; Apligraf #2. Left anterior tibia is healed. Right anterior tibia much better. Only one small area remains on the right posterior calf which was part of his initial wound area 12/3; the left anterior tibia has opened up again. Hyper granulated nodule. Right anterior tibia is superficial and larger. There is no depth of this. I did not place Apligraf today return to [facility] under compression 12/10; the patient has 2 remaining wounds 1 on the right posterior and the other on the left anterior. Both of these look quite good. No debridement is required we have been using Hydrofera Blue under compression. 12/17; the patient has really not closed over which is disappointing. His wraps were very tight and there may be some wrap injury issues. On the left he has the original wound on the medial mid tibia he has a wrap injury on the lateral mid tibia. On the right his posterior areas are superficial but certainly not closed 12/31; the patient has totally closed on the left. He will transition to his own 20/30 below-knee stocking on the left leg today. Interestingly on the right posterior calf the 2 wounds that he had from last time of closed however there is an additional injury in this same area. Almost looks as  though there is subcutaneous fluid/blistering. There is no evidence of cellulitis this does not seem tender 06/24/2019; the patient comes in having been placed in his 20/30 stockings on the left leg last week. He states that he noted blisters break open on Saturday and they had  opened into wounds on the anterior tibial area on the left by Monday. The area on the posterior right leg looks satisfactory we are still doing that and compression. We are using calcium alginate to all wound areas 1/14; the patient had juxta light however one of them was not the right size which will need to be returned. The new area last week on the left anterior tibia looks a lot better it measures smaller I would say the area on the right is about the same, this is on the right posterior calf. We have been using silver alginate Electronic Signature(s) Signed: 06/30/2019 5:49:05 PM By: Linton Ham MD Entered By: Linton Ham on 06/30/2019 14:11:30 -------------------------------------------------------------------------------- Physical Exam Details Patient Name: Date of Service: [name] 06/30/2019 1:00 PM Medical Record [id number] Patient Account Number: [id number] Date of Birth/Sex: Treating RN: [birth year] (64 y.o. M) Primary Care Provider: [name] Other Clinician: Referring Provider: Treating Provider/Extender:Marcellina Jonsson, [name], [name] in Treatment: 38 Constitutional Patient is hypertensive.. Pulse regular and within target range for patient.[name] Respirations regular, non-labored and within target range.. Temperature is normal and within the target range for the patient.[name] Appears in no distress. Cardiovascular Pedal pulses palpable and strong bilaterally.. Edema control is good. Notes Wound exam Still the superficial area on the right posterior calf. I do not see much difference in this was week. The blistered area from last week on the left anterior tibia  appears to be a lot better. No debridement is required in either area Electronic Signature(s) Signed: 06/30/2019 5:49:05 PM By: Linton Ham MD Entered By: Linton Ham on 06/30/2019 14:12:32 -------------------------------------------------------------------------------- Physician Orders Details Patient Name: Date of Service: [name] 06/30/2019 1:00 PM Medical Record [id number] Patient Account Number: [id number] Date of Birth/Sex: Treating RN: [birth year] (64 y.o. [name] Primary Care Provider: [name] Other Clinician: Referring Provider: Treating Provider/Extender:Faven Watterson, [name], [name] in Treatment: [phone] Verbal / Phone Orders: No Diagnosis Coding ICD-10 Coding Code Description [phone] Non-pressure chronic ulcer of right calf with fat layer exposed E11.622 Type 2 diabetes mellitus with other skin ulcer I87.321 Chronic venous hypertension (idiopathic) with inflammation of right lower extremity S81.811D Laceration without foreign body, right lower leg, subsequent encounter Follow-up Appointments Return Appointment in 1 week. Dressing Change Frequency Do not change entire dressing for one week. Skin Barriers/Peri-Wound Care Moisturizing lotion - both legs patient to lotion daily at night. Wound Cleansing May shower with protection. Primary Wound Dressing Wound #6 Right,Proximal,Posterior Lower Leg Hydrofera Blue Wound #8R Left,Anterior Lower Leg Hydrofera Blue Secondary Dressing Wound #6 Right,Proximal,Posterior Lower Leg Dry Gauze ABD pad Wound #8R Left,Anterior Lower Leg Dry Gauze ABD pad Edema Control 3 Layer Compression System - Bilateral Patient to wear own compression stockings - patient to bring in on next wound care appointment. Avoid standing for long periods of time Elevate legs to the level of the heart or above for 30 minutes daily and/or when sitting, a frequency of: - 3-4 times a day. Exercise  regularly Support Garment 30-40 mm/Hg pressure to: - Will order Juxtalites-HD for both legs Electronic Signature(s) Signed: 06/30/2019 5:49:05 PM By: Linton Ham MD Signed: 06/30/2019 6:31:26 PM By: Levan Hurst RN, BSN Entered By: Levan Hurst on 06/30/2019 13:55:10 -------------------------------------------------------------------------------- Problem List Details Patient Name: Date of Service: [name] 06/30/2019 1:00 PM Medical Record [id number] Patient Account Number: [id number] Date of Birth/Sex: Treating RN: [birth year] (64 y.o. [name] Primary Care Provider: [name] Other Clinician: Referring Provider: Treating Provider/Extender:Leonte Horrigan, [name], [name]  in Treatment: 48 Active Problems ICD-10 Evaluated Encounter Code Description Active Date Today Diagnosis L97.212 Non-pressure chronic ulcer of right calf with fat layer 07/29/2018 No Yes exposed E11.622 Type 2 diabetes mellitus with other skin ulcer 07/29/2018 No Yes I87.321 Chronic venous hypertension (idiopathic) with 07/29/2018 No Yes inflammation of right lower extremity S81.811D Laceration without foreign body, right lower leg, 02/14/2019 No Yes subsequent encounter L97.822 Non-pressure chronic ulcer of other part of left lower 06/30/2019 No Yes leg with fat layer exposed Inactive Problems ICD-10 Code Description Active Date Inactive Date S81.812D Laceration without foreign body, left lower leg, subsequent 02/14/2019 02/14/2019 encounter L03.115 Cellulitis of right lower limb 09/24/2018 09/24/2018 Resolved Problems Electronic Signature(s) Signed: 06/30/2019 5:49:05 PM By: Linton Ham MD Entered By: Linton Ham on 06/30/2019 14:16:08 -------------------------------------------------------------------------------- Progress Note Details Patient Name: Date of Service: [name] 06/30/2019 1:00 PM Medical Record [id number] Patient Account  Number: [id number] Date of Birth/Sex: Treating RN: July 27, 1955 (64 y.o. M) Primary Care Provider: [name] Other Clinician: Referring Provider: Treating Provider/Extender:Kiersten Coss, [name], [name] in Treatment: 48 Subjective History of Present Illness (HPI) ADMISSION 07/29/2018 Mr. Castellano is a 64 year old man with either prediabetes or diabetes he is on glipizide. In late October to November 2019 he noted a scabbed area on the back of his right calf. He picked this off a few times but it would not heal. He saw his primary physician on 12/5. It was felt at that time that this may actually heal on its own with conservative management. The next visit was on 07/20/2018 noted the area was a lot worse and arranged for his treatment here. He has been topical antibiotics like Neosporin although he stopped using this when the wound looked worse. He is just been covering this with a clean Band-Aid. He is given Bactrim a week ago and he is finishing this currently. It is made some improvement in the surrounding erythema per the patient. He does not have a history of nonhealing wounds. No prior history of wounds on his legs that he had difficulty healing. No prior skin issues. 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 Past medical history; A. fib status post ablation and recent cardioversion he is on Xarelto. He has a history of systolic heart failure, cervical radiculopathy, cardiomyopathy and type 2 diabetes as discussed ABI in the right leg was noncompressible on the right 2/20; the biopsy I did on the patient last week was negative for malignancy. Culture grew Pseudomonas and methicillin sensitive staph aureus. He is on cefdinir 300 twice a day. I will have to make this a 10 day course. He put him in compression. He states that the redness pain and erythema are a lot better. I suspect the patient has chronic venous insufficiency probably with a secondary  cellulitis 2/27; arrives today with copious amounts of drainage coming out of the wound irritating the skin below the wound area. He only renewed the final 3 days of cefdinir today 3/5; came in for a nurse change 3 days ago. Again a lot of drainage noted. Zinc oxide was applied unfortunately the drainage appears to a pool then he has a string of superficial open areas extending down into the Achilles area and some just below the wound. The wound itself does not look too bad. He is completed the antibiotics although apparently there was separation from the original 7 with the last 3 days. Culture grew a few MSSA and a few Pseudomonas 3/12; not too much difference  over the last week. He came in for a dressing change on Monday by our nursing staff. Necrotic debris again over the wound surface. The entire area looks irritated but nontender and I do not think shows obvious evidence of infection. We have not heard anything about the reflux studies. Also notable than in this diabetic man we had noncompressible vessels and although I can feel his pulses easily in his feet I will order arterial studies as well. 3/19-Patient had experience more pain has been dressed with a silver alginate, the wounds all have necrotic debris, very friable with easy bleeding with any kind of surface debridement including with gauze and Anasept and with a #3 curette. His vascular appointments unfortunately were all canceled on account of the virus outbreak resulting in studies only being done for emergent cases. His pulses are easily palpable in the lower extremity. He has open areas below the primary wound where he states the drainage which included purulent material also with some blood pooled and caused breakdown. 3/26; the patient was changed to Santyl with [facility] backing last week. This was largely because the silver alginate was sticking to the wound. He is still having quite a bit of pain. He tells [place] that the  reflux studies and arterial studies we had attempted to arrange through vein and vascular are not being booked until mid May. Culture that was done last week showed both Serratia moderate and a few methicillin sensitive staph aureus I started him on Bactrim DS 1 p.o. twice daily on 3/23 for 7 days. He is here for follow-up. 4/3; patient is on Santyl with Hydrofera Blue. Patient complains of pain. Our intake nurse is noted drainage and some odor. He has completed Bactrim recently for Serratia and a few methicillin sensitive staph aureus. After our staff push hard last week we were able to get venous studies as well as arterial studies. His arterial studies showed on the right great toe pressure of 0.86. On the left ABI at 1.47 TBI of 0.87. On both sides waveforms were triphasic VENOUS STUDIES showed reflux in the femoral vein, popliteal vein great saphenous vein at the saphenofemoral junction and great saphenous vein at the proximal thigh. Also noted to have age-indeterminate superficial vein thrombosis involving the small saphenous vein. Notable that the patient is already on Xarelto for atrial fibrillation. 4/9; the patient has been to see vascular surgery and reviewed by Dr. [name]. He was felt to have only minimal superficial venous reflux in the right great greater saphenous vein. For this reason he was not felt to be a candidate for laser ablation of the right greater saphenous vein. A wedge cushion was suggested to keep his leg elevated. He does have deep venous reflux on the right. Culture I did last time showed a combination of Serratia Pseudomonas and methicillin sensitive staph aureus. We have been using Hydrofera Blue to the large wound, Santyl to some of the deep punched out satellite lesions distal to it. There is some improvement [id number]; the application for Apligraf was put out for further review for material that we have resubmitted. He has the deep area on the right posterior calf  which is large and then 3 small punched out areas beneath this. In general his wounds look a lot better 4/23; the patient has his large original wound and then 2 punched out satellite lesions below it on the right posterior calf. I was usually able to use Apligraf #1 to cover the full surface area of  the larger wound. We continued with Santyl and Hydrofera Blue on the 2 punched-out areas 5/7; patient has his large original wound and 2 punched out satellite lesions below it in the right posterior calf. Apligraf #2 today. Major improvement in the big wound in terms of wound depth. Punched-out [place] wounds have a very healthy looking wound bed. 5/21-Patient comes back for his large right posterior calf wound for which she has been an Apligraf #3 today. Wound depth seems to be better, the punched-out wounds appear to have healthy bed with some bleeding 6/4; right posterior calf wound is much better. Apligraf #4 today. The major wound has no wound depth at all. Only 1 of the satellite lesions is still open. The patient has very high blood pressure coming in today with a diastolic blood pressure of 130 after lying there for a few minutes it was down to 110. He states that is running [id number] diastolic at home. He was high last week as well I thought he was on 50 mg 1/2 tablet twice daily of losartan I told him to double up on that however it turns out he is actually on 50 twice daily. Course he is run out of his medications prematurely. He has a follow-up with his primary's office next Wednesday. 6/18; patient's wounds continue to improve. The small area laterally is just about closed and there is continued epithelialization on the area on the posterior calf. 7/2; we applied his last Apligraf last visit. Early the next week there was a lot of purulent looking drainage that I cultured this grew staph and Pseudomonas however when we had him back for the next visit to 3 days later everything looked a lot  better. He did not receive systemic antibiotics. Fortunately we did not have to remove the Apligraf. He has had heart trouble this week he was having an ablation apparently they have discovered a clot in his left atrium they have changed all his anticoagulants as well as his blood pressure medication. 7/16; using Hydrofera Blue. We are making nice progress towards closure. He has not yet ordered his compression stockings he has his measurements 7/23; dimensions are better. He has a new satellite area superiorly. Both wounds cauterized with silver nitrate. 7/30; absolutely no change in the major wound on the posterior part of the right calf. We have been using Hydrofera Blue for a prolonged period of time and really had a nice improvement but over the last few weeks this is stalled. There is no depth. He arrived in clinic today with a new area on the right anterior tibia which apparently was an "ingrown hair". It is clearly an open area. This looks like a wrap injury although he was not really aware of it. 8/31; since the patient was last in clinic he was admitted to hospital from 8/17 through 8/22. He had had multiple falls at home including one off a ladder. He was admitted to hospital with mild COVID-19 viral pneumonia. Noted to be hyperkalemic and in acute renal failure. He has chronic systolic heart failure with ejection fraction of 35%. He arrives back in clinic with the original wound on the posterior aspect of the right lower calf looking a lot better. He has eschared areas from falls and lacerations on his anterior knees bilaterally just below the patella and on the mid part of his tibia bilaterally. He seems to have a small area on the right lateral ankle as well 9/10- Patient comes to clinic with a  2 new wounds one on the left anterior shin and one on the right posterior leg both the left knee and right knee areas are healed up. We have been using Santyl to the left anterior leg and  silver alginate to right posterior leg wounds. 9/17; the patient is original wound looks good on the posterior right calf. He has traumatic areas on the left anterior shin left anterior patellar tendon and the right mid tibia area. Except for the right posterior calf all required debridement. We have been using Santyl on these areas and silver alginate posteriorly right calf 9/24; the original wound on the posterior calf on the right looks good. This is healthy. There are traumatic wounds in the left anterior shin, left anterior patella in the right mid tibia area are about the same. He has dusky erythema on the left anterior tibia which led me to give him antibiotics last week. This does not look too much different. This is nontender and I wonder if this is all just venous inflammation. 10/2; the patient has 2 open areas on the right posterior calf both of these look good we have been using silver alginate. He has traumatic wounds on the left and right mid tibia areas. The surface of these wounds is still not ready for closure. We have been using Iodoflex. Finally has areas on the left knee. The latter wounds are all traumatic after a fall 10/9. His original wounds of the right posterior calf are superficial and progressing towards closure. His traumatic wounds on the left anterior tibia and right anterior tibia are considerably improved in terms of the wound surface. He asked me about a skin graft in these areas I do not think this is going to be necessary. Change from Iodosorb to [facility]. ooThe area on the left knee is just about closed as well 10/16; his original wound on the right posterior calf very superficial looks healthy. He has more recent traumatic wounds on the left anterior tibia and right anterior tibia both of these have better looking surfaces and measuring smaller. Might be beneficial for an advanced treatment product. The area on his left knee has a small  superficial scab and we are going to close that when 10/23; his original wound areas on the right posterior calf continue to improve. The more recently traumatic wounds on the left anterior tibia and right anterior tibia both look better in terms of surfaces. No debridement required. We have been using Hydrofera Blue. He is approved for Apligraf 11/3; Apligraf #1 applied to the left anterior tibia right anterior tibia and to 2 small areas remaining on the right posterior calf which were part of his initial wound area. 11/19; Apligraf #2. Left anterior tibia is healed. Right anterior tibia much better. Only one small area remains on the right posterior calf which was part of his initial wound area 12/3; the left anterior tibia has opened up again. Hyper granulated nodule. Right anterior tibia is superficial and larger. There is no depth of this. I did not place Apligraf today return to [facility] under compression 12/10; the patient has 2 remaining wounds 1 on the right posterior and the other on the left anterior. Both of these look quite good. No debridement is required we have been using Hydrofera Blue under compression. 12/17; the patient has really not closed over which is disappointing. His wraps were very tight and there may be some wrap injury issues. On the left he has the original wound  on the medial mid tibia he has a wrap injury on the lateral mid tibia. On the right his posterior areas are superficial but certainly not closed 12/31; the patient has totally closed on the left. He will transition to his own 20/30 below-knee stocking on the left leg today. Interestingly on the right posterior calf the 2 wounds that he had from last time of closed however there is an additional injury in this same area. Almost looks as though there is subcutaneous fluid/blistering. There is no evidence of cellulitis this does not seem tender 06/24/2019; the patient comes in having been placed in his  20/30 stockings on the left leg last week. He states that he noted blisters break open on Saturday and they had opened into wounds on the anterior tibial area on the left by Monday. The area on the posterior right leg looks satisfactory we are still doing that and compression. We are using calcium alginate to all wound areas 1/14; the patient had juxta light however one of them was not the right size which will need to be returned. The new area last week on the left anterior tibia looks a lot better it measures smaller I would say the area on the right is about the same, this is on the right posterior calf. We have been using silver alginate Objective Constitutional Patient is hypertensive.. Pulse regular and within target range for patient.[name] Respirations regular, non-labored and within target range.. Temperature is normal and within the target range for the patient.[name] Appears in no distress. Vitals Time Taken: 1:23 PM, Height: 73 in, Weight: 290 lbs, BMI: 38.3, Temperature: 98.6 F, Pulse: 78 bpm, Respiratory Rate: 18 breaths/min, Blood Pressure: 160/90 mmHg. Cardiovascular Pedal pulses palpable and strong bilaterally.. Edema control is good. General Notes: Wound exam ooStill the superficial area on the right posterior calf. I do not see much difference in this was week. ooThe blistered area from last week on the left anterior tibia appears to be a lot better. ooNo debridement is required in either area Integumentary (Hair, Skin) Wound #12 status is Open. Original cause of wound was Gradually Appeared. The wound is located on the Left,Medial Lower Leg. The wound measures 0cm length x 0cm width x 0cm depth; 0cm^2 area and 0cm^3 volume. There is no tunneling or undermining noted. There is a none present amount of drainage noted. The wound margin is distinct with the outline attached to the wound base. There is no granulation within the wound bed. There is no necrotic tissue within the wound  bed. Wound #6 status is Open. Original cause of wound was Gradually Appeared. The wound is located on the Right,Proximal,Posterior Lower Leg. The wound measures 3cm length x 1.5cm width x 0.1cm depth; 3.534cm^2 area and 0.353cm^3 volume. There is Fat Layer (Subcutaneous Tissue) Exposed exposed. There is no tunneling or undermining noted. There is a medium amount of serosanguineous drainage noted. The wound margin is flat and intact. There is large (67-100%) red, pink granulation within the wound bed. There is no necrotic tissue within the wound bed. Wound #8R status is Open. Original cause of wound was Gradually Appeared. The wound is located on the Left,Anterior Lower Leg. The wound measures 2cm length x 0.6cm width x 0.1cm depth; 0.942cm^2 area and 0.094cm^3 volume. There is Fat Layer (Subcutaneous Tissue) Exposed exposed. There is no tunneling or undermining noted. There is a medium amount of serosanguineous drainage noted. The wound margin is flat and intact. There is large (67-100%) red, pink granulation within  the wound bed. There is no necrotic tissue within the wound bed. Assessment Active Problems ICD-10 Non-pressure chronic ulcer of right calf with fat layer exposed Type 2 diabetes mellitus with other skin ulcer Chronic venous hypertension (idiopathic) with inflammation of right lower extremity Laceration without foreign body, right lower leg, subsequent encounter Non-pressure chronic ulcer of other part of left lower leg with fat layer exposed Procedures Wound #6 Pre-procedure diagnosis of Wound #6 is a Venous Leg Ulcer located on the Right,Proximal,Posterior Lower Leg . There was a Three Layer Compression Therapy Procedure by Levan Hurst, RN. Post procedure Diagnosis Wound #6: Same as Pre-Procedure Wound #8R Pre-procedure diagnosis of Wound #8R is a Venous Leg Ulcer located on the Left,Anterior Lower Leg . There was a Three Layer Compression Therapy Procedure by Levan Hurst, RN. Post procedure Diagnosis Wound #8R: Same as Pre-Procedure Plan Follow-up Appointments: Return Appointment in 1 week. Dressing Change Frequency: Do not change entire dressing for one week. Skin Barriers/Peri-Wound Care: Moisturizing lotion - both legs patient to lotion daily at night. Wound Cleansing: May shower with protection. Primary Wound Dressing: Wound #6 Right,Proximal,Posterior Lower Leg: Hydrofera Blue Wound #8R Left,Anterior Lower Leg: Hydrofera Blue Secondary Dressing: Wound #6 Right,Proximal,Posterior Lower Leg: Dry Gauze ABD pad Wound #8R Left,Anterior Lower Leg: Dry Gauze ABD pad Edema Control: 3 Layer Compression System - Bilateral Patient to wear own compression stockings - patient to bring in on next wound care appointment. Avoid standing for long periods of time Elevate legs to the level of the heart or above for 30 minutes daily and/or when sitting, a frequency of: - 3-4 times a day. Exercise regularly Support Garment 30-40 mm/Hg pressure to: - Will order Juxtalites-HD for both legs 1. I change the primary dressing to Hydrofera Blue 2. We are returning one of his external compression garments 3. I cannot see any good reason why either 1 of these areas will progress to closure Electronic Signature(s) Signed: 06/30/2019 2:16:41 PM By: Linton Ham MD Entered By: Linton Ham on 06/30/2019 14:16:40 -------------------------------------------------------------------------------- SuperBill Details Patient Name: Date of Service: [name] 06/30/2019 Medical Record [id number] Patient Account Number: [id number] Date of Birth/Sex: Treating RN: Dec 13, 1955 (64 y.o. M) Primary Care Provider: [name] Other Clinician: Referring Provider: Treating Provider/Extender:Evaristo Tsuda, [name], [name] in Treatment: 48 Diagnosis Coding ICD-10 Codes Code Description [phone] Non-pressure chronic ulcer of right calf  with fat layer exposed E11.622 Type 2 diabetes mellitus with other skin ulcer I87.321 Chronic venous hypertension (idiopathic) with inflammation of right lower extremity S81.811D Laceration without foreign body, right lower leg, subsequent encounter L97.822 Non-pressure chronic ulcer of other part of left lower leg with fat layer exposed Facility Procedures CPT4: Code [id number] Description: [id number] BILATERAL: Application of multi-layer venous compression system; leg (below knee), including ankle and foot. Modifier Quantity: 1 Physician Procedures CPT4 Code Description: [id number] - WC PHYS LEVEL 3 - EST PT ICD-10 Diagnosis Description L97.212 Non-pressure chronic ulcer of right calf with fat layer L97.822 Non-pressure chronic ulcer of other part of left lower E11.622 Type 2 diabetes mellitus  with other skin ulcer Modifier: exposed leg with fat laye Quantity: 1 r exposed Electronic Signature(s) Signed: 06/30/2019 6:31:26 PM By: Levan Hurst RN, BSN Signed: 07/01/2019 6:20:19 PM By: Linton Ham MD Previous Signature: 06/30/2019 5:49:05 PM Version By: Linton Ham MD Entered By: Levan Hurst on 06/30/2019 18:00:32

## 2019-07-07 ENCOUNTER — Encounter (HOSPITAL_BASED_OUTPATIENT_CLINIC_OR_DEPARTMENT_OTHER): Payer: BC Managed Care – PPO | Admitting: Internal Medicine

## 2019-07-07 ENCOUNTER — Other Ambulatory Visit: Payer: Self-pay

## 2019-07-07 DIAGNOSIS — L97822 Non-pressure chronic ulcer of other part of left lower leg with fat layer exposed: Secondary | ICD-10-CM | POA: Diagnosis not present

## 2019-07-07 DIAGNOSIS — G4730 Sleep apnea, unspecified: Secondary | ICD-10-CM | POA: Diagnosis not present

## 2019-07-07 DIAGNOSIS — I87311 Chronic venous hypertension (idiopathic) with ulcer of right lower extremity: Secondary | ICD-10-CM | POA: Diagnosis not present

## 2019-07-07 DIAGNOSIS — I5022 Chronic systolic (congestive) heart failure: Secondary | ICD-10-CM | POA: Diagnosis not present

## 2019-07-07 DIAGNOSIS — I110 Hypertensive heart disease with heart failure: Secondary | ICD-10-CM | POA: Diagnosis not present

## 2019-07-07 DIAGNOSIS — L97819 Non-pressure chronic ulcer of other part of right lower leg with unspecified severity: Secondary | ICD-10-CM | POA: Diagnosis not present

## 2019-07-07 DIAGNOSIS — Z8616 Personal history of COVID-19: Secondary | ICD-10-CM | POA: Diagnosis not present

## 2019-07-07 DIAGNOSIS — I4891 Unspecified atrial fibrillation: Secondary | ICD-10-CM | POA: Diagnosis not present

## 2019-07-07 DIAGNOSIS — Z7901 Long term (current) use of anticoagulants: Secondary | ICD-10-CM | POA: Diagnosis not present

## 2019-07-07 DIAGNOSIS — E11622 Type 2 diabetes mellitus with other skin ulcer: Secondary | ICD-10-CM | POA: Diagnosis not present

## 2019-07-07 DIAGNOSIS — M5412 Radiculopathy, cervical region: Secondary | ICD-10-CM | POA: Diagnosis not present

## 2019-07-07 DIAGNOSIS — L97212 Non-pressure chronic ulcer of right calf with fat layer exposed: Secondary | ICD-10-CM | POA: Diagnosis not present

## 2019-07-07 DIAGNOSIS — J45909 Unspecified asthma, uncomplicated: Secondary | ICD-10-CM | POA: Diagnosis not present

## 2019-07-07 DIAGNOSIS — I429 Cardiomyopathy, unspecified: Secondary | ICD-10-CM | POA: Diagnosis not present

## 2019-07-07 DIAGNOSIS — I87313 Chronic venous hypertension (idiopathic) with ulcer of bilateral lower extremity: Secondary | ICD-10-CM | POA: Diagnosis not present

## 2019-07-07 DIAGNOSIS — I872 Venous insufficiency (chronic) (peripheral): Secondary | ICD-10-CM | POA: Diagnosis not present

## 2019-07-07 NOTE — Progress Notes (Signed)
[name], [name] ([name]) Visit Report for 07/07/2019 HPI Details Patient Name: Date of Service: [name], [name] 07/07/2019 1:45 PM Medical Record [name] Patient Account Number: [id number] Date of Birth/Sex: Treating RN: 11-16-1955 (64 y.o. M) Primary Care Provider: [name] Other Clinician: Referring Provider: Treating Provider/Extender:[name], [name], [name] in Treatment: 49 History of Present Illness HPI Description: ADMISSION 07/29/2018 [name] is a 64 year old man with either prediabetes or diabetes he is on glipizide. In late October to November 2019 he noted a scabbed area on the back of his right calf. He picked this off a few times but it would not heal. He saw his primary physician on 12/5. It was felt at that time that this may actually heal on its own with conservative management. The next visit was on 07/20/2018 noted the area was a lot worse and arranged for his treatment here. He has been topical antibiotics like Neosporin although he stopped using this when the wound looked worse. He is just been covering this with a clean Band-Aid. He is given Bactrim a week ago and he is finishing this currently. It is made some improvement in the surrounding erythema per the patient. He does not have a history of nonhealing wounds. No prior history of wounds on his legs that he had difficulty healing. No prior skin issues. 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 Past medical history; A. fib status post ablation and recent cardioversion he is on Xarelto. He has a history of systolic heart failure, cervical radiculopathy, cardiomyopathy and type 2 diabetes as discussed ABI in the right leg was noncompressible on the right 2/20; the biopsy I did on the patient last week was negative for malignancy. Culture grew Pseudomonas and methicillin sensitive staph aureus. He is on cefdinir 300 twice a day. I will have to make this a 10 day  course. He put him in compression. He states that the redness pain and erythema are a lot better. I suspect the patient has chronic venous insufficiency probably with a secondary cellulitis 2/27; arrives today with copious amounts of drainage coming out of the wound irritating the skin below the wound area. He only renewed the final 3 days of cefdinir today 3/5; came in for a nurse change 3 days ago. Again a lot of drainage noted. Zinc oxide was applied unfortunately the drainage appears to a pool then he has a string of superficial open areas extending down into the Achilles area and some just below the wound. The wound itself does not look too bad. He is completed the antibiotics although apparently there was separation from the original 7 with the last 3 days. Culture grew a few MSSA and a few Pseudomonas 3/12; not too much difference over the last week. He came in for a dressing change on Monday by our nursing staff. Necrotic debris again over the wound surface. The entire area looks irritated but nontender and I do not think shows obvious evidence of infection. We have not heard anything about the reflux studies. Also notable than in this diabetic man we had noncompressible vessels and although I can feel his pulses easily in his feet I will order arterial studies as well. 3/19-Patient had experience more pain has been dressed with a silver alginate, the wounds all have necrotic debris, very friable with easy bleeding with any kind of surface debridement including with gauze and Anasept and with a #3 curette. His vascular appointments unfortunately were all canceled on account of  the virus outbreak resulting in studies only being done for emergent cases. His pulses are easily palpable in the lower extremity. He has open areas below the primary wound where he states the drainage which included purulent material also with some blood pooled and caused breakdown. 3/26; the patient was changed  to Santyl with [facility] backing last week. This was largely because the silver alginate was sticking to the wound. He is still having quite a bit of pain. He tells [place] that the reflux studies and arterial studies we had attempted to arrange through vein and vascular are not being booked until mid May. Culture that was done last week showed both Serratia moderate and a few methicillin sensitive staph aureus I started him on Bactrim DS 1 p.o. twice daily on 3/23 for 7 days. He is here for follow-up. 4/3; patient is on Santyl with Hydrofera Blue. Patient complains of pain. Our intake nurse is noted drainage and some odor. He has completed Bactrim recently for Serratia and a few methicillin sensitive staph aureus. After our staff push hard last week we were able to get venous studies as well as arterial studies. His arterial studies showed on the right great toe pressure of 0.86. On the left ABI at 1.47 TBI of 0.87. On both sides waveforms were triphasic VENOUS STUDIES showed reflux in the femoral vein, popliteal vein great saphenous vein at the saphenofemoral junction and great saphenous vein at the proximal thigh. Also noted to have age-indeterminate superficial vein thrombosis involving the small saphenous vein. Notable that the patient is already on Xarelto for atrial fibrillation. 4/9; the patient has been to see vascular surgery and reviewed by Dr. [name]. He was felt to have only minimal superficial venous reflux in the right great greater saphenous vein. For this reason he was not felt to be a candidate for laser ablation of the right greater saphenous vein. A wedge cushion was suggested to keep his leg elevated. He does have deep venous reflux on the right. Culture I did last time showed a combination of Serratia Pseudomonas and methicillin sensitive staph aureus. We have been using Hydrofera Blue to the large wound, Santyl to some of the deep punched out satellite lesions distal to it.  There is some improvement [id number]; the application for Apligraf was put out for further review for material that we have resubmitted. He has the deep area on the right posterior calf which is large and then 3 small punched out areas beneath this. In general his wounds look a lot better 4/23; the patient has his large original wound and then 2 punched out satellite lesions below it on the right posterior calf. I was usually able to use Apligraf #1 to cover the full surface area of the larger wound. We continued with Santyl and Hydrofera Blue on the 2 punched-out areas 5/7; patient has his large original wound and 2 punched out satellite lesions below it in the right posterior calf. Apligraf #2 today. Major improvement in the big wound in terms of wound depth. Punched-out [place] wounds have a very healthy looking wound bed. 5/21-Patient comes back for his large right posterior calf wound for which she has been an Apligraf #3 today. Wound depth seems to be better, the punched-out wounds appear to have healthy bed with some bleeding 6/4; right posterior calf wound is much better. Apligraf #4 today. The major wound has no wound depth at all. Only 1 of the satellite lesions is still open. The patient has very  high blood pressure coming in today with a diastolic blood pressure of 130 after lying there for a few minutes it was down to 110. He states that is running [id number] diastolic at home. He was high last week as well I thought he was on 50 mg 1/2 tablet twice daily of losartan I told him to double up on that however it turns out he is actually on 50 twice daily. Course he is run out of his medications prematurely. He has a follow-up with his primary's office next Wednesday. 6/18; patient's wounds continue to improve. The small area laterally is just about closed and there is continued epithelialization on the area on the posterior calf. 7/2; we applied his last Apligraf last visit. Early the next week  there was a lot of purulent looking drainage that I cultured this grew staph and Pseudomonas however when we had him back for the next visit to 3 days later everything looked a lot better. He did not receive systemic antibiotics. Fortunately we did not have to remove the Apligraf. He has had heart trouble this week he was having an ablation apparently they have discovered a clot in his left atrium they have changed all his anticoagulants as well as his blood pressure medication. 7/16; using Hydrofera Blue. We are making nice progress towards closure. He has not yet ordered his compression stockings he has his measurements 7/23; dimensions are better. He has a new satellite area superiorly. Both wounds cauterized with silver nitrate. 7/30; absolutely no change in the major wound on the posterior part of the right calf. We have been using Hydrofera Blue for a prolonged period of time and really had a nice improvement but over the last few weeks this is stalled. There is no depth. He arrived in clinic today with a new area on the right anterior tibia which apparently was an "ingrown hair". It is clearly an open area. This looks like a wrap injury although he was not really aware of it. 8/31; since the patient was last in clinic he was admitted to hospital from 8/17 through 8/22. He had had multiple falls at home including one off a ladder. He was admitted to hospital with mild COVID-19 viral pneumonia. Noted to be hyperkalemic and in acute renal failure. He has chronic systolic heart failure with ejection fraction of 35%. He arrives back in clinic with the original wound on the posterior aspect of the right lower calf looking a lot better. He has eschared areas from falls and lacerations on his anterior knees bilaterally just below the patella and on the mid part of his tibia bilaterally. He seems to have a small area on the right lateral ankle as well 9/10- Patient comes to clinic with a 2 new  wounds one on the left anterior shin and one on the right posterior leg both the left knee and right knee areas are healed up. We have been using Santyl to the left anterior leg and silver alginate to right posterior leg wounds. 9/17; the patient is original wound looks good on the posterior right calf. He has traumatic areas on the left anterior shin left anterior patellar tendon and the right mid tibia area. Except for the right posterior calf all required debridement. We have been using Santyl on these areas and silver alginate posteriorly right calf 9/24; the original wound on the posterior calf on the right looks good. This is healthy. There are traumatic wounds in the left anterior shin, left anterior  patella in the right mid tibia area are about the same. He has dusky erythema on the left anterior tibia which led me to give him antibiotics last week. This does not look too much different. This is nontender and I wonder if this is all just venous inflammation. 10/2; the patient has 2 open areas on the right posterior calf both of these look good we have been using silver alginate. He has traumatic wounds on the left and right mid tibia areas. The surface of these wounds is still not ready for closure. We have been using Iodoflex. Finally has areas on the left knee. The latter wounds are all traumatic after a fall 10/9. His original wounds of the right posterior calf are superficial and progressing towards closure. His traumatic wounds on the left anterior tibia and right anterior tibia are considerably improved in terms of the wound surface. He asked me about a skin graft in these areas I do not think this is going to be necessary. Change from Iodosorb to [facility]. The area on the left knee is just about closed as well 10/16; his original wound on the right posterior calf very superficial looks healthy. He has more recent traumatic wounds on the left anterior tibia and right anterior  tibia both of these have better looking surfaces and measuring smaller. Might be beneficial for an advanced treatment product. The area on his left knee has a small superficial scab and we are going to close that when 10/23; his original wound areas on the right posterior calf continue to improve. The more recently traumatic wounds on the left anterior tibia and right anterior tibia both look better in terms of surfaces. No debridement required. We have been using Hydrofera Blue. He is approved for Apligraf 11/3; Apligraf #1 applied to the left anterior tibia right anterior tibia and to 2 small areas remaining on the right posterior calf which were part of his initial wound area. 11/19; Apligraf #2. Left anterior tibia is healed. Right anterior tibia much better. Only one small area remains on the right posterior calf which was part of his initial wound area 12/3; the left anterior tibia has opened up again. Hyper granulated nodule. Right anterior tibia is superficial and larger. There is no depth of this. I did not place Apligraf today return to [facility] under compression 12/10; the patient has 2 remaining wounds 1 on the right posterior and the other on the left anterior. Both of these look quite good. No debridement is required we have been using Hydrofera Blue under compression. 12/17; the patient has really not closed over which is disappointing. His wraps were very tight and there may be some wrap injury issues. On the left he has the original wound on the medial mid tibia he has a wrap injury on the lateral mid tibia. On the right his posterior areas are superficial but certainly not closed 12/31; the patient has totally closed on the left. He will transition to his own 20/30 below-knee stocking on the left leg today. Interestingly on the right posterior calf the 2 wounds that he had from last time of closed however there is an additional injury in this same area. Almost looks as  though there is subcutaneous fluid/blistering. There is no evidence of cellulitis this does not seem tender 06/24/2019; the patient comes in having been placed in his 20/30 stockings on the left leg last week. He states that he noted blisters break open on Saturday and they had  opened into wounds on the anterior tibial area on the left by Monday. The area on the posterior right leg looks satisfactory we are still doing that and compression. We are using calcium alginate to all wound areas 1/14; the patient had juxta light however one of them was not the right size which will need to be returned. The new area last week on the left anterior tibia looks a lot better it measures smaller I would say the area on the right is about the same, this is on the right posterior calf. We have been using silver alginate 1/21; he has his bilateral juxta lites however the left anterior wound continues to look better where is the 2 areas on the right posterior calf look about the same. We have been using Hydrofera Blue under compression with the juxta lite stockings now on standby Electronic Signature(s) Signed: 07/07/2019 5:54:58 PM By: Linton Ham MD Entered By: Linton Ham on 07/07/2019 16:01:01 -------------------------------------------------------------------------------- Physical Exam Details Patient Name: Date of Service: [name] 07/07/2019 1:45 PM Medical Record [id number] Patient Account Number: [id number] Date of Birth/Sex: Treating RN: March 03, 1956 (64 y.o. M) Primary Care Provider: [name] Other Clinician: Referring Provider: Treating Provider/Extender:Amair Shrout, [name], [name] in Treatment: 49 Constitutional Sitting or standing Blood Pressure is within target range for patient.. Pulse regular and within target range for patient.[name] Respirations regular, non-labored and within target range.. Temperature is normal and within the target range for the  patient.[name] Appears in no distress. Notes Wound exam; Still a superficial area on the right posterior calf. I do not see much of a difference again debris around the circumference debrided with Anasept and gauze. This cleans up quite nicely. The area on the left anterior tibia appears to be healing. Electronic Signature(s) Signed: 07/07/2019 5:54:58 PM By: Linton Ham MD Entered By: Linton Ham on 07/07/2019 16:02:00 -------------------------------------------------------------------------------- Physician Orders Details Patient Name: Date of Service: [name], [name] 07/07/2019 1:45 PM Medical Record [id number] Patient Account Number: [id number] Date of Birth/Sex: Treating RN: 1955-08-19 (64 y.o. [name], Tammi Klippel Primary Care Provider: [name] Other Clinician: Referring Provider: Treating Provider/Extender:Lesieli Bresee, [name], [name] in Treatment: 59 Verbal / Phone Orders: No Diagnosis Coding ICD-10 Coding Code Description [phone] Non-pressure chronic ulcer of right calf with fat layer exposed E11.622 Type 2 diabetes mellitus with other skin ulcer I87.321 Chronic venous hypertension (idiopathic) with inflammation of right lower extremity S81.811D Laceration without foreign body, right lower leg, subsequent encounter L97.822 Non-pressure chronic ulcer of other part of left lower leg with fat layer exposed Follow-up Appointments Return Appointment in 1 week. Dressing Change Frequency Do not change entire dressing for one week. Skin Barriers/Peri-Wound Care Moisturizing lotion - both legs patient to lotion daily at night. Wound Cleansing May shower with protection. Primary Wound Dressing Wound #6 Right,Proximal,Posterior Lower Leg Hydrofera Blue Wound #8R Left,Anterior Lower Leg Hydrofera Blue Secondary Dressing Wound #6 Right,Proximal,Posterior Lower Leg Dry Gauze Wound #8R Left,Anterior Lower Leg Dry Gauze Edema Control 3 Layer  Compression System - Bilateral Patient to wear own compression stockings - patient to bring in on next wound care appointment. Avoid standing for long periods of time Elevate legs to the level of the heart or above for 30 minutes daily and/or when sitting, a frequency of: - 3-4 times a day. Exercise regularly Support Garment 30-40 mm/Hg pressure to: - patient to bring in clinic weekly. Electronic Signature(s) Signed: 07/07/2019 5:54:58 PM By: Linton Ham MD Signed: 07/07/2019 6:20:01 PM By: Deon Pilling Entered By: Rolin Barry  Bobbi on 07/07/2019 15:50:29 -------------------------------------------------------------------------------- Problem List Details Patient Name: Date of Service: [name], [name] 07/07/2019 1:45 PM Medical Record [name] Patient Account Number: [id number] Date of Birth/Sex: Treating RN: 1956/04/15 (64 y.o. [name], [name] Primary Care Provider: [name] Other Clinician: Referring Provider: Treating Provider/Extender:Giorgio Chabot, [name], [name] in Treatment: 49 Active Problems ICD-10 Evaluated Encounter Code Description Active Date Today Diagnosis L97.212 Non-pressure chronic ulcer of right calf with fat layer 07/29/2018 No Yes exposed E11.622 Type 2 diabetes mellitus with other skin ulcer 07/29/2018 No Yes I87.321 Chronic venous hypertension (idiopathic) with 07/29/2018 No Yes inflammation of right lower extremity S81.811D Laceration without foreign body, right lower leg, 02/14/2019 No Yes subsequent encounter L97.822 Non-pressure chronic ulcer of other part of left lower 06/30/2019 No Yes leg with fat layer exposed Inactive Problems ICD-10 Code Description Active Date Inactive Date S81.812D Laceration without foreign body, left lower leg, subsequent 02/14/2019 02/14/2019 encounter L03.115 Cellulitis of right lower limb 09/24/2018 09/24/2018 Resolved Problems Electronic Signature(s) Signed: 07/07/2019 5:54:58 PM By: Linton Ham MD Entered By: Linton Ham on 07/07/2019 16:00:17 -------------------------------------------------------------------------------- Progress Note Details Patient Name: Date of Service: [name] 07/07/2019 1:45 PM Medical Record [id number] Patient Account Number: [id number] Date of Birth/Sex: Treating RN: 1955-09-12 (64 y.o. M) Primary Care Provider: [name] Other Clinician: Referring Provider: Treating Provider/Extender:Lemoine Goyne, [name], [name] in Treatment: 49 Subjective History of Present Illness (HPI) ADMISSION 07/29/2018 [name] is a 64 year old man with either prediabetes or diabetes he is on glipizide. In late October to November 2019 he noted a scabbed area on the back of his right calf. He picked this off a few times but it would not heal. He saw his primary physician on 12/5. It was felt at that time that this may actually heal on its own with conservative management. The next visit was on 07/20/2018 noted the area was a lot worse and arranged for his treatment here. He has been topical antibiotics like Neosporin although he stopped using this when the wound looked worse. He is just been covering this with a clean Band-Aid. He is given Bactrim a week ago and he is finishing this currently. It is made some improvement in the surrounding erythema per the patient. He does not have a history of nonhealing wounds. No prior history of wounds on his legs that he had difficulty healing. No prior skin issues. 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 Past medical history; A. fib status post ablation and recent cardioversion he is on Xarelto. He has a history of systolic heart failure, cervical radiculopathy, cardiomyopathy and type 2 diabetes as discussed ABI in the right leg was noncompressible on the right 2/20; the biopsy I did on the patient last week was negative for malignancy. Culture grew Pseudomonas  and methicillin sensitive staph aureus. He is on cefdinir 300 twice a day. I will have to make this a 10 day course. He put him in compression. He states that the redness pain and erythema are a lot better. I suspect the patient has chronic venous insufficiency probably with a secondary cellulitis 2/27; arrives today with copious amounts of drainage coming out of the wound irritating the skin below the wound area. He only renewed the final 3 days of cefdinir today 3/5; came in for a nurse change 3 days ago. Again a lot of drainage noted. Zinc oxide was applied unfortunately the drainage appears to a pool then he has a string of superficial open areas extending  down into the Achilles area and some just below the wound. The wound itself does not look too bad. He is completed the antibiotics although apparently there was separation from the original 7 with the last 3 days. Culture grew a few MSSA and a few Pseudomonas 3/12; not too much difference over the last week. He came in for a dressing change on Monday by our nursing staff. Necrotic debris again over the wound surface. The entire area looks irritated but nontender and I do not think shows obvious evidence of infection. We have not heard anything about the reflux studies. Also notable than in this diabetic man we had noncompressible vessels and although I can feel his pulses easily in his feet I will order arterial studies as well. 3/19-Patient had experience more pain has been dressed with a silver alginate, the wounds all have necrotic debris, very friable with easy bleeding with any kind of surface debridement including with gauze and Anasept and with a #3 curette. His vascular appointments unfortunately were all canceled on account of the virus outbreak resulting in studies only being done for emergent cases. His pulses are easily palpable in the lower extremity. He has open areas below the primary wound where he states the drainage which  included purulent material also with some blood pooled and caused breakdown. 3/26; the patient was changed to Santyl with [facility] backing last week. This was largely because the silver alginate was sticking to the wound. He is still having quite a bit of pain. He tells [place] that the reflux studies and arterial studies we had attempted to arrange through vein and vascular are not being booked until mid May. Culture that was done last week showed both Serratia moderate and a few methicillin sensitive staph aureus I started him on Bactrim DS 1 p.o. twice daily on 3/23 for 7 days. He is here for follow-up. 4/3; patient is on Santyl with Hydrofera Blue. Patient complains of pain. Our intake nurse is noted drainage and some odor. He has completed Bactrim recently for Serratia and a few methicillin sensitive staph aureus. After our staff push hard last week we were able to get venous studies as well as arterial studies. His arterial studies showed on the right great toe pressure of 0.86. On the left ABI at 1.47 TBI of 0.87. On both sides waveforms were triphasic VENOUS STUDIES showed reflux in the femoral vein, popliteal vein great saphenous vein at the saphenofemoral junction and great saphenous vein at the proximal thigh. Also noted to have age-indeterminate superficial vein thrombosis involving the small saphenous vein. Notable that the patient is already on Xarelto for atrial fibrillation. 4/9; the patient has been to see vascular surgery and reviewed by Dr. [name]. He was felt to have only minimal superficial venous reflux in the right great greater saphenous vein. For this reason he was not felt to be a candidate for laser ablation of the right greater saphenous vein. A wedge cushion was suggested to keep his leg elevated. He does have deep venous reflux on the right. Culture I did last time showed a combination of Serratia Pseudomonas and methicillin sensitive staph aureus. We have been using  Hydrofera Blue to the large wound, Santyl to some of the deep punched out satellite lesions distal to it. There is some improvement [id number]; the application for Apligraf was put out for further review for material that we have resubmitted. He has the deep area on the right posterior calf which is large and then  3 small punched out areas beneath this. In general his wounds look a lot better 4/23; the patient has his large original wound and then 2 punched out satellite lesions below it on the right posterior calf. I was usually able to use Apligraf #1 to cover the full surface area of the larger wound. We continued with Santyl and Hydrofera Blue on the 2 punched-out areas 5/7; patient has his large original wound and 2 punched out satellite lesions below it in the right posterior calf. Apligraf #2 today. Major improvement in the big wound in terms of wound depth. Punched-out [place] wounds have a very healthy looking wound bed. 5/21-Patient comes back for his large right posterior calf wound for which she has been an Apligraf #3 today. Wound depth seems to be better, the punched-out wounds appear to have healthy bed with some bleeding 6/4; right posterior calf wound is much better. Apligraf #4 today. The major wound has no wound depth at all. Only 1 of the satellite lesions is still open. The patient has very high blood pressure coming in today with a diastolic blood pressure of 130 after lying there for a few minutes it was down to 110. He states that is running [id number] diastolic at home. He was high last week as well I thought he was on 50 mg 1/2 tablet twice daily of losartan I told him to double up on that however it turns out he is actually on 50 twice daily. Course he is run out of his medications prematurely. He has a follow-up with his primary's office next Wednesday. 6/18; patient's wounds continue to improve. The small area laterally is just about closed and there is  continued epithelialization on the area on the posterior calf. 7/2; we applied his last Apligraf last visit. Early the next week there was a lot of purulent looking drainage that I cultured this grew staph and Pseudomonas however when we had him back for the next visit to 3 days later everything looked a lot better. He did not receive systemic antibiotics. Fortunately we did not have to remove the Apligraf. He has had heart trouble this week he was having an ablation apparently they have discovered a clot in his left atrium they have changed all his anticoagulants as well as his blood pressure medication. 7/16; using Hydrofera Blue. We are making nice progress towards closure. He has not yet ordered his compression stockings he has his measurements 7/23; dimensions are better. He has a new satellite area superiorly. Both wounds cauterized with silver nitrate. 7/30; absolutely no change in the major wound on the posterior part of the right calf. We have been using Hydrofera Blue for a prolonged period of time and really had a nice improvement but over the last few weeks this is stalled. There is no depth. He arrived in clinic today with a new area on the right anterior tibia which apparently was an "ingrown hair". It is clearly an open area. This looks like a wrap injury although he was not really aware of it. 8/31; since the patient was last in clinic he was admitted to hospital from 8/17 through 8/22. He had had multiple falls at home including one off a ladder. He was admitted to hospital with mild COVID-19 viral pneumonia. Noted to be hyperkalemic and in acute renal failure. He has chronic systolic heart failure with ejection fraction of 35%. He arrives back in clinic with the original wound on the posterior aspect of the right lower  calf looking a lot better. He has eschared areas from falls and lacerations on his anterior knees bilaterally just below the patella and on the mid part of his  tibia bilaterally. He seems to have a small area on the right lateral ankle as well 9/10- Patient comes to clinic with a 2 new wounds one on the left anterior shin and one on the right posterior leg both the left knee and right knee areas are healed up. We have been using Santyl to the left anterior leg and silver alginate to right posterior leg wounds. 9/17; the patient is original wound looks good on the posterior right calf. He has traumatic areas on the left anterior shin left anterior patellar tendon and the right mid tibia area. Except for the right posterior calf all required debridement. We have been using Santyl on these areas and silver alginate posteriorly right calf 9/24; the original wound on the posterior calf on the right looks good. This is healthy. There are traumatic wounds in the left anterior shin, left anterior patella in the right mid tibia area are about the same. He has dusky erythema on the left anterior tibia which led me to give him antibiotics last week. This does not look too much different. This is nontender and I wonder if this is all just venous inflammation. 10/2; the patient has 2 open areas on the right posterior calf both of these look good we have been using silver alginate. He has traumatic wounds on the left and right mid tibia areas. The surface of these wounds is still not ready for closure. We have been using Iodoflex. Finally has areas on the left knee. The latter wounds are all traumatic after a fall 10/9. His original wounds of the right posterior calf are superficial and progressing towards closure. His traumatic wounds on the left anterior tibia and right anterior tibia are considerably improved in terms of the wound surface. He asked me about a skin graft in these areas I do not think this is going to be necessary. Change from Iodosorb to [facility]. ooThe area on the left knee is just about closed as well 10/16; his original wound on the right  posterior calf very superficial looks healthy. He has more recent traumatic wounds on the left anterior tibia and right anterior tibia both of these have better looking surfaces and measuring smaller. Might be beneficial for an advanced treatment product. The area on his left knee has a small superficial scab and we are going to close that when 10/23; his original wound areas on the right posterior calf continue to improve. The more recently traumatic wounds on the left anterior tibia and right anterior tibia both look better in terms of surfaces. No debridement required. We have been using Hydrofera Blue. He is approved for Apligraf 11/3; Apligraf #1 applied to the left anterior tibia right anterior tibia and to 2 small areas remaining on the right posterior calf which were part of his initial wound area. 11/19; Apligraf #2. Left anterior tibia is healed. Right anterior tibia much better. Only one small area remains on the right posterior calf which was part of his initial wound area 12/3; the left anterior tibia has opened up again. Hyper granulated nodule. Right anterior tibia is superficial and larger. There is no depth of this. I did not place Apligraf today return to [facility] under compression 12/10; the patient has 2 remaining wounds 1 on the right posterior and the other on the  left anterior. Both of these look quite good. No debridement is required we have been using Hydrofera Blue under compression. 12/17; the patient has really not closed over which is disappointing. His wraps were very tight and there may be some wrap injury issues. On the left he has the original wound on the medial mid tibia he has a wrap injury on the lateral mid tibia. On the right his posterior areas are superficial but certainly not closed 12/31; the patient has totally closed on the left. He will transition to his own 20/30 below-knee stocking on the left leg today. Interestingly on the right posterior  calf the 2 wounds that he had from last time of closed however there is an additional injury in this same area. Almost looks as though there is subcutaneous fluid/blistering. There is no evidence of cellulitis this does not seem tender 06/24/2019; the patient comes in having been placed in his 20/30 stockings on the left leg last week. He states that he noted blisters break open on Saturday and they had opened into wounds on the anterior tibial area on the left by Monday. The area on the posterior right leg looks satisfactory we are still doing that and compression. We are using calcium alginate to all wound areas 1/14; the patient had juxta light however one of them was not the right size which will need to be returned. The new area last week on the left anterior tibia looks a lot better it measures smaller I would say the area on the right is about the same, this is on the right posterior calf. We have been using silver alginate 1/21; he has his bilateral juxta lites however the left anterior wound continues to look better where is the 2 areas on the right posterior calf look about the same. We have been using Hydrofera Blue under compression with the juxta lite stockings now on standby Objective Constitutional Sitting or standing Blood Pressure is within target range for patient.. Pulse regular and within target range for patient.[name] Respirations regular, non-labored and within target range.. Temperature is normal and within the target range for the patient.[name] Appears in no distress. Vitals Time Taken: 2:45 PM, Height: 73 in, Weight: 290 lbs, BMI: 38.3, Temperature: 98.3 F, Pulse: 55 bpm, Respiratory Rate: 18 breaths/min, Blood Pressure: 138/77 mmHg. General Notes: Wound exam; ooStill a superficial area on the right posterior calf. I do not see much of a difference again debris around the circumference debrided with Anasept and gauze. This cleans up quite nicely. ooThe area on the left  anterior tibia appears to be healing. Integumentary (Hair, Skin) Wound #6 status is Open. Original cause of wound was Gradually Appeared. The wound is located on the Right,Proximal,Posterior Lower Leg. The wound measures 3cm length x 0.3cm width x 0.1cm depth; 0.707cm^2 area and 0.071cm^3 volume. There is Fat Layer (Subcutaneous Tissue) Exposed exposed. There is no tunneling or undermining noted. There is a medium amount of serosanguineous drainage noted. The wound margin is flat and intact. There is large (67-100%) red, pink granulation within the wound bed. There is no necrotic tissue within the wound bed. Wound #8R status is Open. Original cause of wound was Gradually Appeared. The wound is located on the Left,Anterior Lower Leg. The wound measures 1.4cm length x 0.8cm width x 0.1cm depth; 0.88cm^2 area and 0.088cm^3 volume. There is Fat Layer (Subcutaneous Tissue) Exposed exposed. There is no tunneling or undermining noted. There is a medium amount of serosanguineous drainage noted. The wound margin  is flat and intact. There is large (67-100%) red, pink granulation within the wound bed. There is no necrotic tissue within the wound bed. Assessment Active Problems ICD-10 Non-pressure chronic ulcer of right calf with fat layer exposed Type 2 diabetes mellitus with other skin ulcer Chronic venous hypertension (idiopathic) with inflammation of right lower extremity Laceration without foreign body, right lower leg, subsequent encounter Non-pressure chronic ulcer of other part of left lower leg with fat layer exposed Procedures Wound #6 Pre-procedure diagnosis of Wound #6 is a Venous Leg Ulcer located on the Right,Proximal,Posterior Lower Leg . There was a Three Layer Compression Therapy Procedure with a pre-treatment ABI of 0.9 by Deon Pilling, RN. Post procedure Diagnosis Wound #6: Same as Pre-Procedure Wound #8R Pre-procedure diagnosis of Wound #8R is a Venous Leg Ulcer located on the  Left,Anterior Lower Leg . There was a Three Layer Compression Therapy Procedure with a pre-treatment ABI of 0.9 by Deon Pilling, RN. Post procedure Diagnosis Wound #8R: Same as Pre-Procedure Plan Follow-up Appointments: Return Appointment in 1 week. Dressing Change Frequency: Do not change entire dressing for one week. Skin Barriers/Peri-Wound Care: Moisturizing lotion - both legs patient to lotion daily at night. Wound Cleansing: May shower with protection. Primary Wound Dressing: Wound #6 Right,Proximal,Posterior Lower Leg: Hydrofera Blue Wound #8R Left,Anterior Lower Leg: Hydrofera Blue Secondary Dressing: Wound #6 Right,Proximal,Posterior Lower Leg: Dry Gauze Wound #8R Left,Anterior Lower Leg: Dry Gauze Edema Control: 3 Layer Compression System - Bilateral Patient to wear own compression stockings - patient to bring in on next wound care appointment. Avoid standing for long periods of time Elevate legs to the level of the heart or above for 30 minutes daily and/or when sitting, a frequency of: - 3-4 times a day. Exercise regularly Support Garment 30-40 mm/Hg pressure to: - patient to bring in clinic weekly. 1. Continue with Hydrofera Blue bilaterally under 3 layer compression. 2. I honestly cannot see why these wound closed. His edema control is good. There was some eschar that washed off around the circumference of the wounds on the right Electronic Signature(s) Signed: 07/07/2019 5:54:58 PM By: Linton Ham MD Entered By: Linton Ham on 07/07/2019 16:02:56 -------------------------------------------------------------------------------- SuperBill Details Patient Name: Date of Service: [name] 07/07/2019 Medical Record [phone] Patient Account Number: [id number] Date of Birth/Sex: Treating RN: Sep 19, 1955 (64 y.o. [name], Tammi Klippel Primary Care Provider: [name] Other Clinician: Referring Provider: Treating Provider/Extender:Andee Chivers,  [name], [name] in Treatment: 49 Diagnosis Coding ICD-10 Codes Code Description [phone] Non-pressure chronic ulcer of right calf with fat layer exposed E11.622 Type 2 diabetes mellitus with other skin ulcer I87.321 Chronic venous hypertension (idiopathic) with inflammation of right lower extremity S81.811D Laceration without foreign body, right lower leg, subsequent encounter L97.822 Non-pressure chronic ulcer of other part of left lower leg with fat layer exposed Facility Procedures CPT4: Description Modifier Quantity Code [id number] BILATERAL: Application of multi-layer venous compression system; leg 1 (below knee), including ankle and foot. Physician Procedures CPT4 Code Description: [id number] - WC PHYS LEVEL 2 - EST PT ICD-10 Diagnosis Description L97.212 Non-pressure chronic ulcer of right calf with fat layer L97.822 Non-pressure chronic ulcer of other part of left lower l Modifier: exposed eg with fat laye Quantity: 1 r exposed Electronic Signature(s) Signed: 07/07/2019 5:54:58 PM By: Linton Ham MD Entered By: Linton Ham on 07/07/2019 16:03:15

## 2019-07-07 NOTE — Progress Notes (Addendum)
LEDGER, HEINDL ([id number]) Visit Report for 07/07/2019 Arrival Information Details Patient Name: Date of Service: [name] 07/07/2019 1:45 PM Medical Record [id number] Patient Account Number: [id number] Date of Birth/Sex: Treating RN: 1955/11/06 (64 y.o. [name] Primary Care Rolland Steinert: [name] Other Clinician: Referring Georgi Navarrete: Treating Ysela Hettinger/Extender:Robson, [name], [name] in Treatment: 49 Visit Information History Since Last Visit Added or deleted any medications: No Patient Arrived: Ambulatory Any new allergies or adverse reactions: No Arrival Time: 14:59 Had a fall or experienced change in No Accompanied By: self activities of daily living that may affect Transfer Assistance: None risk of falls: Patient Identification Verified: Yes Signs or symptoms of abuse/neglect since last No Secondary Verification Process Yes visito Completed: Hospitalized since last visit: No Patient Requires Transmission-Based No Implantable device outside of the clinic excluding No Precautions: cellular tissue based products placed in the center Patient Has Alerts: Yes since last visit: Patient Alerts: Patient on Blood Has Dressing in Place as Prescribed: Yes Thinner Has Compression in Place as Prescribed: Yes xarelto Pain Present Now: No R TBI = .86, L TBI = .87 Electronic Signature(s) Signed: 07/07/2019 6:15:07 PM By: Kela Millin Entered By: Kela Millin on 07/07/2019 14:59:46 -------------------------------------------------------------------------------- Compression Therapy Details Patient Name: Date of Service: [name] 07/07/2019 1:45 PM Medical Record [id number] Patient Account Number: [id number] Date of Birth/Sex: Treating RN: 02-12-1956 (64 y.o. [name] Primary Care Coutney Wildermuth: [name] Other Clinician: Referring Keller Mikels: Treating Wyvonne Carda/Extender:Robson,  [name], [name] in Treatment: 49 Compression Therapy Performed for Wound Wound #6 Right,Proximal,Posterior Lower Leg Assessment: Performed By: Clinician Deon Pilling, RN Compression Type: Three Layer Pre Treatment ABI: 0.9 Post Procedure Diagnosis Same as Pre-procedure Electronic Signature(s) Signed: 07/07/2019 6:20:01 PM By: Deon Pilling Entered By: Deon Pilling on 07/07/2019 15:51:09 -------------------------------------------------------------------------------- Compression Therapy Details Patient Name: Date of Service: [name] 07/07/2019 1:45 PM Medical Record [id number] Patient Account Number: [id number] Date of Birth/Sex: Treating RN: Jan 02, 1956 (64 y.o. [name] Primary Care Rogene Meth: [name] Other Clinician: Referring Shree Espey: Treating Ansen Sayegh/Extender:Robson, [name], [name] in Treatment: 49 Compression Therapy Performed for Wound Wound #8R Left,Anterior Lower Leg Assessment: Performed By: Clinician Deon Pilling, RN Compression Type: Three Layer Pre Treatment ABI: 0.9 Post Procedure Diagnosis Same as Pre-procedure Electronic Signature(s) Signed: 07/07/2019 6:20:01 PM By: Deon Pilling Entered By: Deon Pilling on 07/07/2019 15:51:09 -------------------------------------------------------------------------------- Encounter Discharge Information Details Patient Name: Date of Service: [name] 07/07/2019 1:45 PM Medical Record [id number] Patient Account Number: [id number] Date of Birth/Sex: Treating RN: 05-22-1956 (64 y.o. [name] Primary Care Montell Leopard: [name] Other Clinician: Referring Zadia Uhde: Treating Christal Lagerstrom/Extender:Robson, [name], [name] in Treatment: 49 Encounter Discharge Information Items Discharge Condition: Stable Ambulatory Status: Ambulatory Discharge Destination: Home Transportation: Private Auto Accompanied By:  self Schedule Follow-up Appointment: Yes Clinical Summary of Care: Patient Declined Electronic Signature(s) Signed: 07/07/2019 5:58:49 PM By: Baruch Gouty RN, BSN Entered By: Baruch Gouty on 07/07/2019 16:10:13 -------------------------------------------------------------------------------- Lower Extremity Assessment Details Patient Name: Date of Service: [name] 07/07/2019 1:45 PM Medical Record [id number] Patient Account Number: [id number] Date of Birth/Sex: Treating RN: 09-18-1955 (64 y.o. [name] Primary Care Kaydan Wong: [name] Other Clinician: Referring [place] Oborn: Treating Jemila Camille/Extender:Robson, [name], [name] in Treatment: 49 Edema Assessment Assessed: [Left: No] [Right: No] Edema: [Left: Yes] [Right: Yes] Calf Left: Right: Point of Measurement: 45 cm From Medial Instep 41 cm 39 cm Ankle Left: Right: Point of Measurement: 13 cm From Medial Instep 22.6 cm 22.5 cm Vascular Assessment  Pulses: Dorsalis Pedis Palpable: [Left:Yes] [Right:Yes] Electronic Signature(s) Signed: 07/07/2019 6:15:07 PM By: Kela Millin Entered By: Kela Millin on 07/07/2019 15:00:41 -------------------------------------------------------------------------------- Multi Wound Chart Details Patient Name: Date of Service: [name]. 07/07/2019 1:45 PM Medical Record [id number] Patient Account Number: [id number] Date of Birth/Sex: Treating RN: [birth year] (64 y.o. M) Primary Care Aylla Huffine: [name] Other Clinician: Referring Angelyse Heslin: Treating Ramaya Guile/Extender:Robson, [name], [name] in Treatment: 49 Vital Signs Height(in): 73 Pulse(bpm): 55 Weight(lbs): 290 Blood Pressure(mmHg): 138/77 Body Mass Index(BMI): 38 Temperature(F): 98.3 Respiratory 18 Rate(breaths/min): Photos: [6:No Photos] [8R:No Photos] [N/A:N/A] Wound Location: [6:Right Lower Leg - Posterior, Left Lower Leg -  Anterior Proximal] [N/A:N/A] Wounding Event: [6:Gradually Appeared] [8R:Gradually Appeared] [N/A:N/A] Primary Etiology: [6:Venous Leg Ulcer] [8R:Venous Leg Ulcer] [N/A:N/A] Comorbid History: [6:Asthma, Sleep Apnea, Arrhythmia, Congestive Heart Failure, Hypertension, Heart Failure, Hypertension, Type II Diabetes] [8R:Asthma, Sleep Apnea, Arrhythmia, Congestive Type II Diabetes] [N/A:N/A] Date Acquired: [6:01/06/2019] [8R:02/14/2019] [N/A:N/A] Weeks of Treatment: [6:26] [8R:20] [N/A:N/A] Wound Status: [6:Open] [8R:Open] [N/A:N/A] Wound Recurrence: [6:No] [8R:Yes] [N/A:N/A] Clustered Wound: [6:Yes] [8R:No] [N/A:N/A] Clustered Quantity: [6:2] [8R:N/A] [N/A:N/A] Measurements L x W x D 3x0.3x0.1 [8R:1.4x0.8x0.1] [N/A:N/A] (cm) Area (cm) : [4:4.034] [8R:0.88] [N/A:N/A] Volume (cm) : [6:0.071] [8R:0.088] [N/A:N/A] % Reduction in Area: [6:-79.90%] [8R:86.00%] [N/A:N/A] % Reduction in Volume: -82.10% [8R:86.00%] [N/A:N/A] Classification: [6:Full Thickness Without Exposed Support Structures Exposed Support Structures] [8R:Full Thickness Without] [N/A:N/A] Exudate Amount: [6:Medium] [8R:Medium] [N/A:N/A] Exudate Type: [6:Serosanguineous] [8R:Serosanguineous] [N/A:N/A] Exudate Color: [6:red, brown] [8R:red, brown] [N/A:N/A] Wound Margin: [6:Flat and Intact] [8R:Flat and Intact] [N/A:N/A] Granulation Amount: [6:Large (67-100%)] [8R:Large (67-100%)] [N/A:N/A] Granulation Quality: [6:Red, Pink] [8R:Red, Pink] [N/A:N/A] Necrotic Amount: [6:None Present (0%)] [8R:None Present (0%)] [N/A:N/A] Exposed Structures: [6:Fat Layer (Subcutaneous Fat Layer (Subcutaneous N/A Tissue) Exposed: Yes Fascia: No Tendon: No Muscle: No Joint: No Bone: No] [8R:Tissue) Exposed: Yes Fascia: No Tendon: No Muscle: No Joint: No Bone: No] Epithelialization: [6:Small (1-33%)] [8R:Small (1-33%) Compression Therapy] [N/A:N/A N/A] Treatment Notes Electronic Signature(s) Signed: 07/07/2019 5:54:58 PM By: Linton Ham MD Entered By:  Linton Ham on 07/07/2019 16:00:24 -------------------------------------------------------------------------------- Multi-Disciplinary Care Plan Details Patient Name: Date of Service: [name]. 07/07/2019 1:45 PM Medical Record [id number] Patient Account Number: [id number] Date of Birth/Sex: Treating RN: 1955-06-19 (64 y.o. [name] Primary Care Graviel Payeur: [name] Other Clinician: Referring Joua Bake: Treating Beverley Allender/Extender:Robson, [name], [name] in Treatment: 49 Active Inactive Venous Leg Ulcer Nursing Diagnoses: Actual venous Insuffiency (use after diagnosis is confirmed) Knowledge deficit related to disease process and management Goals: Non-invasive venous studies are completed as ordered Date Initiated: 09/17/2018 Date Inactivated: 09/23/2018 Target Resolution Date: 09/23/2018 Goal Status: Met Patient will maintain optimal edema control Date Initiated: 09/17/2018 Target Resolution Date: 08/19/2019 Goal Status: Active Patient/caregiver will verbalize understanding of disease process and disease management Date Initiated: 09/17/2018 Date Inactivated: 09/23/2018 Target Resolution Date: 10/15/2018 Goal Status: Met Interventions: Assess peripheral edema status every visit. Compression as ordered Provide education on venous insufficiency Treatment Activities: Therapeutic compression applied : 09/17/2018 Notes: Electronic Signature(s) Signed: 07/07/2019 6:20:01 PM By: Deon Pilling Entered By: Deon Pilling on 07/07/2019 15:38:05 -------------------------------------------------------------------------------- Pain Assessment Details Patient Name: Date of Service: [place], LOHR 07/07/2019 1:45 PM Medical Record [id number] Patient Account Number: [id number] Date of Birth/Sex: Treating RN: Jan 01, 1956 (64 y.o. [name] Primary Care Jahel Wavra: [name] Other Clinician: Referring Jenah Vanasten: Treating  Emmalynne Courtney/Extender:Robson, [name], [name] in Treatment: 49 Active Problems Location of Pain Severity and Description of Pain Patient Has Paino No Site Locations Pain Management and Medication Current Pain Management: Electronic Signature(s) Signed:  07/07/2019 6:15:07 PM By: Kela Millin Entered By: Kela Millin on 07/07/2019 15:00:16 -------------------------------------------------------------------------------- Patient/Caregiver Education Details Patient Name: Date of Service: [name] 1/21/2021andnbsp1:45 PM Medical Record [id number] Patient Account Number: [id number] Date of Birth/Gender: Treating RN: Feb 13, 1956 (64 y.o. [name] Primary Care Physician: [name] Other Clinician: Referring Physician: Treating Physician/Extender:Robson, [name], [name] in Treatment: 16 Education Assessment Education Provided To: Patient Education Topics Provided Wound/Skin Impairment: Handouts: Skin Care Do's and Dont's Methods: Explain/Verbal Responses: Reinforcements needed Electronic Signature(s) Signed: 07/07/2019 6:20:01 PM By: Deon Pilling Entered By: Deon Pilling on 07/07/2019 15:38:19 -------------------------------------------------------------------------------- Wound Assessment Details Patient Name: Date of Service: IRFAN, VEAL 07/07/2019 1:45 PM Medical Record [id number] Patient Account Number: [id number] Date of Birth/Sex: Treating RN: 1955-09-26 (64 y.o. [name] Primary Care Melvin Whiteford: [name] Other Clinician: Referring Jenascia Bumpass: Treating Sakib Noguez/Extender:Robson, [name], [name] in Treatment: 49 Wound Status Wound Number: 6 Primary Venous Leg Ulcer Etiology: Wound Location: Right Lower Leg - Posterior, Proximal Wound Open Status: Wounding Event: Gradually Appeared Comorbid Asthma, Sleep Apnea, Arrhythmia, Congestive Date Acquired:  01/06/2019 History: Heart Failure, Hypertension, Type II Diabetes Weeks Of Treatment: 26 Clustered Wound: Yes Photos Wound Measurements Length: (cm) 3 % Reduct Width: (cm) 0.3 % Reduct Depth: (cm) 0.1 Epitheli Clustered Quantity: 2 Tunnelin Area: (cm) 0.707 Undermi Volume: (cm) 0.071 Wound Description Classification: Full Thickness Without Exposed Support Foul Odo Structures Slough/F Wound Flat and Intact Margin: Exudate Medium Amount: Exudate Serosanguineous Type: Exudate red, brown Color: Wound Bed Granulation Amount: Large (67-100%) Granulation Quality: Red, Pink Fascia Exp Necrotic Amount: None Present (0%) Fat Layer Tendon Exp Muscle Exp Joint Expo Bone Expos r After Cleansing: No ibrino No Exposed Structure osed: No (Subcutaneous Tissue) Exposed: Yes osed: No osed: No sed: No ed: No ion in Area: -79.9% ion in Volume: -82.1% alization: Small (1-33%) g: No ning: No Treatment Notes Wound #6 (Right, Proximal, Posterior Lower Leg) 2. Periwound Care Moisturizing lotion 3. Primary Dressing Applied Hydrofera Blue 4. Secondary Dressing Dry Gauze 6. Support Layer Applied 3 layer compression wrap Electronic Signature(s) Signed: 07/08/2019 5:14:05 PM By: Mikeal Hawthorne EMT/HBOT Signed: 07/08/2019 5:39:40 PM By: Kela Millin Previous Signature: 07/07/2019 6:15:07 PM Version By: Kela Millin Entered By: Mikeal Hawthorne on 07/08/2019 14:37:52 -------------------------------------------------------------------------------- Wound Assessment Details Patient Name: Date of Service: MATIAS, THURMAN 07/07/2019 1:45 PM Medical Record [id number] Patient Account Number: [id number] Date of Birth/Sex: Treating RN: 15-Sep-1955 (64 y.o. [name] Primary Care Emmalie Haigh: [name] Other Clinician: Referring Jillaine Waren: Treating Selden Noteboom/Extender:Robson, [name], [name] in Treatment: 49 Wound Status Wound Number: 8R  Primary Venous Leg Ulcer Etiology: Wound Location: Left Lower Leg - Anterior Wound Open Wounding Event: Gradually Appeared Status: Date Acquired: 02/14/2019 Comorbid Asthma, Sleep Apnea, Arrhythmia, Congestive Weeks Of Treatment: 20 History: Heart Failure, Hypertension, Type II Diabetes Clustered Wound: No Photos Wound Measurements Length: (cm) 1.4 % Reduct Width: (cm) 0.8 % Reduct Depth: (cm) 0.1 Epitheli Area: (cm) 0.88 Tunneli Volume: (cm) 0.088 Undermi Wound Description Full Thickness Without Exposed Support Foul Odo Classification: Structures Slough/F Wound Flat and Intact Margin: Exudate Medium Amount: Exudate Serosanguineous Type: Exudate red, brown Color: Wound Bed Granulation Amount: Large (67-100%) Granulation Quality: Red, Pink Fascia E Necrotic Amount: None Present (0%) Fat Laye Tendon E Muscle E Joint Ex Bone Exp r After Cleansing: No ibrino No Exposed Structure xposed: No r (Subcutaneous Tissue) Exposed: Yes xposed: No xposed: No posed: No osed: No ion in Area: 86% ion in Volume: 86% alization: Small (1-33%) ng: No  ning: No Treatment Notes Wound #8R (Left, Anterior Lower Leg) 2. Periwound Care Moisturizing lotion 3. Primary Dressing Applied Hydrofera Blue 4. Secondary Dressing Dry Gauze 6. Support Layer Applied 3 layer compression wrap Electronic Signature(s) Signed: 07/08/2019 5:14:05 PM By: Mikeal Hawthorne EMT/HBOT Signed: 07/08/2019 5:39:40 PM By: Kela Millin Previous Signature: 07/07/2019 6:15:07 PM Version By: Kela Millin Entered By: Mikeal Hawthorne on 07/08/2019 14:35:59 -------------------------------------------------------------------------------- Vitals Details Patient Name: Date of Service: [name]. 07/07/2019 1:45 PM Medical Record [id number] Patient Account Number: [id number] Date of Birth/Sex: Treating RN: Jan 14, 1956 (64 y.o. [name] Primary Care Chanya Chrisley: [name] Other Clinician: Referring Shamaria Kavan: Treating Rahkim Rabalais/Extender:Robson, [name], [name] in Treatment: 49 Vital Signs Time Taken: 14:45 Temperature (F): 98.3 Height (in): 73 Pulse (bpm): 55 Weight (lbs): 290 Respiratory Rate (breaths/min): 18 Body Mass Index (BMI): 38.3 Blood Pressure (mmHg): 138/77 Reference Range: 80 - 120 mg / dl Electronic Signature(s) Signed: 07/07/2019 6:15:07 PM By: Kela Millin Entered By: Kela Millin on 07/07/2019 15:00:12

## 2019-07-14 ENCOUNTER — Other Ambulatory Visit: Payer: Self-pay

## 2019-07-14 ENCOUNTER — Encounter (HOSPITAL_BASED_OUTPATIENT_CLINIC_OR_DEPARTMENT_OTHER): Payer: BC Managed Care – PPO | Admitting: Internal Medicine

## 2019-07-14 DIAGNOSIS — Z7901 Long term (current) use of anticoagulants: Secondary | ICD-10-CM | POA: Diagnosis not present

## 2019-07-14 DIAGNOSIS — Z8616 Personal history of COVID-19: Secondary | ICD-10-CM | POA: Diagnosis not present

## 2019-07-14 DIAGNOSIS — I110 Hypertensive heart disease with heart failure: Secondary | ICD-10-CM | POA: Diagnosis not present

## 2019-07-14 DIAGNOSIS — I429 Cardiomyopathy, unspecified: Secondary | ICD-10-CM | POA: Diagnosis not present

## 2019-07-14 DIAGNOSIS — L97212 Non-pressure chronic ulcer of right calf with fat layer exposed: Secondary | ICD-10-CM | POA: Diagnosis not present

## 2019-07-14 DIAGNOSIS — I87313 Chronic venous hypertension (idiopathic) with ulcer of bilateral lower extremity: Secondary | ICD-10-CM | POA: Diagnosis not present

## 2019-07-14 DIAGNOSIS — I87311 Chronic venous hypertension (idiopathic) with ulcer of right lower extremity: Secondary | ICD-10-CM | POA: Diagnosis not present

## 2019-07-14 DIAGNOSIS — I872 Venous insufficiency (chronic) (peripheral): Secondary | ICD-10-CM | POA: Diagnosis not present

## 2019-07-14 DIAGNOSIS — J45909 Unspecified asthma, uncomplicated: Secondary | ICD-10-CM | POA: Diagnosis not present

## 2019-07-14 DIAGNOSIS — E11622 Type 2 diabetes mellitus with other skin ulcer: Secondary | ICD-10-CM | POA: Diagnosis not present

## 2019-07-14 DIAGNOSIS — I4891 Unspecified atrial fibrillation: Secondary | ICD-10-CM | POA: Diagnosis not present

## 2019-07-14 DIAGNOSIS — L97819 Non-pressure chronic ulcer of other part of right lower leg with unspecified severity: Secondary | ICD-10-CM | POA: Diagnosis not present

## 2019-07-14 DIAGNOSIS — I5022 Chronic systolic (congestive) heart failure: Secondary | ICD-10-CM | POA: Diagnosis not present

## 2019-07-14 DIAGNOSIS — G4730 Sleep apnea, unspecified: Secondary | ICD-10-CM | POA: Diagnosis not present

## 2019-07-14 DIAGNOSIS — L97822 Non-pressure chronic ulcer of other part of left lower leg with fat layer exposed: Secondary | ICD-10-CM | POA: Diagnosis not present

## 2019-07-14 DIAGNOSIS — M5412 Radiculopathy, cervical region: Secondary | ICD-10-CM | POA: Diagnosis not present

## 2019-07-14 NOTE — Progress Notes (Addendum)
CODY, SAFFOLD ([name]) Visit Report for 07/14/2019 Chemical Cauterization Details Patient Name: Date of Service: [name], [name] 07/14/2019 2:00 PM Medical Record [name] Patient Account Number: [id number] Date of Birth/Sex: Treating RN: 06/04/1956 (64 y.o. [name] Primary Care Provider: [name] Other Clinician: Referring Provider: Treating Provider/Extender:Bransen Fassnacht, [name], [name] in Treatment: 76 Procedure Performed for: Wound #6 Right,Proximal,Posterior Lower Leg Performed By: Physician Ricard Dillon., MD Post Procedure Diagnosis Same as Pre-procedure Notes silver nitrate used. Electronic Signature(s) Signed: 07/14/2019 5:54:40 PM By: Linton Ham MD Signed: 07/14/2019 6:05:55 PM By: Deon Pilling Entered By: Deon Pilling on 07/14/2019 15:01:40 -------------------------------------------------------------------------------- Chemical Cauterization Details Patient Name: Date of Service: [name] 07/14/2019 2:00 PM Medical Record [id number] Patient Account Number: [id number] Date of Birth/Sex: Treating RN: 1956/03/09 (64 y.o. M) Primary Care Provider: [name] Other Clinician: Referring Provider: Treating Provider/Extender:Olubunmi Rothenberger, [name], [name] in Treatment: 6 Procedure Performed for: Wound #8RR Left,Anterior Lower Leg Performed By: Physician Ricard Dillon., MD Post Procedure Diagnosis Same as Pre-procedure Notes silver nitrate used. Electronic Signature(s) Signed: 08/11/2019 2:43:54 PM By: Linton Ham MD Previous Signature: 07/14/2019 5:54:40 PM Version By: Linton Ham MD Previous Signature: 07/14/2019 6:05:55 PM Version By: Deon Pilling Entered By: Linton Ham on 08/09/2019 07:56:34 -------------------------------------------------------------------------------- Physical Exam Details Patient Name: Date of Service: [name] 07/14/2019 2:00  PM Medical Record [id number] Patient Account Number: [id number] Date of Birth/Sex: Treating RN: 03/31/1956 (64 y.o. M) Primary Care Provider: [name] Other Clinician: Referring Provider: Treating Provider/Extender:Easter Kennebrew, [name], [name] in Treatment: 57 Constitutional Patient is hypertensive.. Pulse regular and within target range for patient.[name] Respirations regular, non-labored and within target range.. Temperature is normal and within the target range for the patient.[name] Appears in no distress. Notes Wound exam; the area on the posterior right calf and the left anterior calf. Hyper granulated tissue not back with silver nitrate which he tolerates well. The wound surface looks better. Electronic Signature(s) Signed: 08/11/2019 2:43:54 PM By: Linton Ham MD Entered By: Linton Ham on 08/09/2019 07:58:44 -------------------------------------------------------------------------------- Physician Orders Details Patient Name: Date of Service: [name] 07/14/2019 2:00 PM Medical Record [id number] Patient Account Number: [id number] Date of Birth/Sex: Treating RN: Oct 04, 1955 (64 y.o. [name], Tammi Klippel Primary Care Provider: [name] Other Clinician: Referring Provider: Treating Provider/Extender:Rodriquez Thorner, [name], [name] in Treatment: 6 Verbal / Phone Orders: No Diagnosis Coding ICD-10 Coding Code Description [phone] Non-pressure chronic ulcer of right calf with fat layer exposed E11.622 Type 2 diabetes mellitus with other skin ulcer I87.321 Chronic venous hypertension (idiopathic) with inflammation of right lower extremity S81.811D Laceration without foreign body, right lower leg, subsequent encounter L97.822 Non-pressure chronic ulcer of other part of left lower leg with fat layer exposed Follow-up Appointments Return Appointment in 1 week. Dressing Change Frequency Do not change entire dressing for  one week. Skin Barriers/Peri-Wound Care Moisturizing lotion - both legs patient to lotion daily at night. Wound Cleansing May shower with protection. Primary Wound Dressing Wound #6 Right,Proximal,Posterior Lower Leg Hydrofera Blue Wound #8R Left,Anterior Lower Leg Hydrofera Blue Secondary Dressing Wound #6 Right,Proximal,Posterior Lower Leg Dry Gauze Wound #8R Left,Anterior Lower Leg Dry Gauze Edema Control 3 Layer Compression System - Bilateral Patient to wear own compression stockings - patient to bring in on next wound care appointment. Avoid standing for long periods of time Elevate legs to the level of the heart or above for 30 minutes daily and/or when sitting, a frequency of: - 3-4 times a day. Exercise regularly Support Garment  30-40 mm/Hg pressure to: - patient to bring in clinic weekly. Electronic Signature(s) Signed: 07/14/2019 5:54:40 PM By: Linton Ham MD Signed: 07/14/2019 6:05:55 PM By: Deon Pilling Entered By: Deon Pilling on 07/14/2019 15:00:43 -------------------------------------------------------------------------------- Problem List Details Patient Name: Date of Service: [name] 07/14/2019 2:00 PM Medical Record [id number] Patient Account Number: [id number] Date of Birth/Sex: Treating RN: [birth year] (64 y.o. [name], [name] Primary Care Provider: [name] Other Clinician: Referring Provider: Treating Provider/Extender:Nadea Kirkland, [name], [name] in Treatment: 50 Active Problems ICD-10 Evaluated Encounter Code Description Active Date Today Diagnosis L97.212 Non-pressure chronic ulcer of right calf with fat layer 07/29/2018 No Yes exposed E11.622 Type 2 diabetes mellitus with other skin ulcer 07/29/2018 No Yes I87.321 Chronic venous hypertension (idiopathic) with 07/29/2018 No Yes inflammation of right lower extremity S81.811D Laceration without foreign body, right lower leg, 02/14/2019 No Yes subsequent  encounter L97.822 Non-pressure chronic ulcer of other part of left lower 06/30/2019 No Yes leg with fat layer exposed Inactive Problems ICD-10 Code Description Active Date Inactive Date S81.812D Laceration without foreign body, left lower leg, subsequent 02/14/2019 02/14/2019 encounter L03.115 Cellulitis of right lower limb 09/24/2018 09/24/2018 Resolved Problems Electronic Signature(s) Signed: 08/11/2019 2:43:54 PM By: Linton Ham MD Previous Signature: 07/14/2019 5:54:40 PM Version By: Linton Ham MD Previous Signature: 07/14/2019 6:05:55 PM Version By: Deon Pilling Entered By: Linton Ham on 08/09/2019 07:52:17 -------------------------------------------------------------------------------- Progress Note Details Patient Name: Date of Service: [name] 07/14/2019 2:00 PM Medical Record [id number] Patient Account Number: [id number] Date of Birth/Sex: Treating RN: [birth year] (64 y.o. M) Primary Care Provider: [name] Other Clinician: Referring Provider: Treating Provider/Extender:Kiearra Oyervides, [name], [name] in Treatment: 50 Subjective Objective Constitutional Patient is hypertensive.. Pulse regular and within target range for patient.[name] Respirations regular, non-labored and within target range.. Temperature is normal and within the target range for the patient.[name] Appears in no distress. Vitals Time Taken: 2:25 PM, Height: 73 in, Weight: 290 lbs, BMI: 38.3, Temperature: 98.3 F, Pulse: 47 bpm, Respiratory Rate: 19 breaths/min, Blood Pressure: 149/93 mmHg. General Notes: Wound exam; the area on the posterior right calf and the left anterior calf. Hyper granulated tissue not back with silver nitrate which he tolerates well. The wound surface looks better. Integumentary (Hair, Skin) Wound #6 status is Open. Original cause of wound was Gradually Appeared. The wound is located on the Right,Proximal,Posterior Lower Leg. The wound measures  0.2cm length x 0.2cm width x 0.1cm depth; 0.031cm^2 area and 0.003cm^3 volume. There is Fat Layer (Subcutaneous Tissue) Exposed exposed. There is no tunneling or undermining noted. There is a large amount of sanguinous drainage noted. The wound margin is flat and intact. There is large (67-100%) red, pink granulation within the wound bed. There is no necrotic tissue within the wound bed. Wound #8R status is Open. Original cause of wound was Gradually Appeared. The wound is located on the Left,Anterior Lower Leg. The wound measures 0.9cm length x 0.4cm width x 0.1cm depth; 0.283cm^2 area and 0.028cm^3 volume. There is Fat Layer (Subcutaneous Tissue) Exposed exposed. There is no tunneling or undermining noted. There is a medium amount of serosanguineous drainage noted. The wound margin is flat and intact. There is large (67-100%) red, pink granulation within the wound bed. There is no necrotic tissue within the wound bed. Assessment Active Problems ICD-10 Non-pressure chronic ulcer of right calf with fat layer exposed Type 2 diabetes mellitus with other skin ulcer Chronic venous hypertension (idiopathic) with inflammation of right lower extremity Laceration without foreign body, right lower leg,  subsequent encounter Non-pressure chronic ulcer of other part of left lower leg with fat layer exposed Procedures Wound #6 Pre-procedure diagnosis of Wound #6 is a Venous Leg Ulcer located on the Right,Proximal,Posterior Lower Leg . There was a Three Layer Compression Therapy Procedure by Baruch Gouty, RN. Post procedure Diagnosis Wound #6: Same as Pre-Procedure Pre-procedure diagnosis of Wound #6 is a Venous Leg Ulcer located on the Right,Proximal,Posterior Lower Leg . An Chemical Cauterization procedure was performed by Ricard Dillon., MD. Post procedure Diagnosis Wound #6: Same as Pre-Procedure Notes: silver nitrate used. Wound #8R Pre-procedure diagnosis of Wound #8R is a Venous Leg  Ulcer located on the Left,Anterior Lower Leg . There was a Three Layer Compression Therapy Procedure by Baruch Gouty, RN. Post procedure Diagnosis Wound #8R: Same as Pre-Procedure Wound #8RR Pre-procedure diagnosis of Wound #8RR is a Venous Leg Ulcer located on the Left,Anterior Lower Leg . An Chemical Cauterization procedure was performed by Ricard Dillon., MD. Post procedure Diagnosis Wound #8RR: Same as Pre-Procedure Notes: silver nitrate used. Plan Follow-up Appointments: Return Appointment in 1 week. Dressing Change Frequency: Do not change entire dressing for one week. Skin Barriers/Peri-Wound Care: Moisturizing lotion - both legs patient to lotion daily at night. Wound Cleansing: May shower with protection. Primary Wound Dressing: Wound #6 Right,Proximal,Posterior Lower Leg: Hydrofera Blue Wound #8R Left,Anterior Lower Leg: Hydrofera Blue Secondary Dressing: Wound #6 Right,Proximal,Posterior Lower Leg: Dry Gauze Wound #8R Left,Anterior Lower Leg: Dry Gauze Edema Control: 3 Layer Compression System - Bilateral Patient to wear own compression stockings - patient to bring in on next wound care appointment. Avoid standing for long periods of time Elevate legs to the level of the heart or above for 30 minutes daily and/or when sitting, a frequency of: - 3-4 times a day. Exercise regularly Support Garment 30-40 mm/Hg pressure to: - patient to bring in clinic weekly. 1. Continue with Hydrofera Blue under compression 3 layer bilaterally Electronic Signature(s) Signed: 08/11/2019 2:43:54 PM By: Linton Ham MD Entered By: Linton Ham on 08/09/2019 08:00:58 -------------------------------------------------------------------------------- SuperBill Details Patient Name: Date of Service: [name] 07/14/2019 Medical Record [name] Patient Account Number: [id number] Date of Birth/Sex: Treating RN: April 10, 1956 (64 y.o. [name], Tammi Klippel Primary Care  Provider: [name] Other Clinician: Referring Provider: Treating Provider/Extender:Ashara Lounsbury, [name], [name] in Treatment: 50 Diagnosis Coding ICD-10 Codes Code Description [phone] Non-pressure chronic ulcer of right calf with fat layer exposed E11.622 Type 2 diabetes mellitus with other skin ulcer I87.321 Chronic venous hypertension (idiopathic) with inflammation of right lower extremity S81.811D Laceration without foreign body, right lower leg, subsequent encounter L97.822 Non-pressure chronic ulcer of other part of left lower leg with fat layer exposed Facility Procedures CPT4: Code [id number] I Description: 250 - CHEM CAUT GRANULATION TISS CD-10 Diagnosis Description L97.212 Non-pressure chronic ulcer of right calf with fat layer exposed L97.822 Non-pressure chronic ulcer of other part of left lower leg with f Modifier Quantity: 1 at layer exposed CPT4: [id number] (b Description: [id number] BILATERAL: Application of multi-layer venous compression system; leg elow knee), including ankle and foot. Modifier Quantity: 1 Physician Procedures CPT4 Code Description: [id number] - WC PHYS CHEM CAUT GRAN TISSUE ICD-10 Diagnosis Description L97.212 Non-pressure chronic ulcer of right calf with fat layer exp L97.822 Non-pressure chronic ulcer of other part of left lower leg Modifier: osed with fat layer Quantity: 1 exposed Electronic Signature(s) Signed: 08/11/2019 2:43:54 PM By: Linton Ham MD Previous Signature: 07/14/2019 5:54:40 PM Version By: Linton Ham MD Previous Signature: 07/14/2019 6:05:55 PM Version  By: Deon Pilling Entered By: Linton Ham on 08/09/2019 08:02:07

## 2019-07-15 NOTE — Progress Notes (Addendum)
NIV, DARLEY ([id number]) Visit Report for 07/14/2019 Arrival Information Details Patient Name: Date of Service: [name], [name] 07/14/2019 2:00 PM Medical Record [id number] Patient Account Number: [id number] Date of Birth/Sex: Treating RN: March 12, 1956 (64 y.o. [name] Primary Care Deforest Maiden: [name] Other Clinician: Referring Sibel Khurana: Treating Marillyn Goren/Extender:Robson, [name], [name] in Treatment: 60 Visit Information History Since Last Visit Added or deleted any medications: No Patient Arrived: Ambulatory Any new allergies or adverse reactions: No Arrival Time: 14:27 Had a fall or experienced change in No Accompanied By: self activities of daily living that may affect Transfer Assistance: None risk of falls: Patient Identification Verified: Yes Signs or symptoms of abuse/neglect since last No Secondary Verification Process Yes visito Completed: Hospitalized since last visit: No Patient Requires Transmission- No Implantable device outside of the clinic excluding No Based Precautions: cellular tissue based products placed in the center Patient Has Alerts: Yes Patient on Blood since last visit: Patient Alerts: Has Dressing in Place as Prescribed: Yes Thinner Has Compression in Place as Prescribed: Yes xarelto R TBI = .86, L TBI Pain Present Now: No = .87 Electronic Signature(s) Signed: 07/14/2019 5:20:11 PM By: Kela Millin Entered By: Kela Millin on 07/14/2019 14:27:44 -------------------------------------------------------------------------------- Compression Therapy Details Patient Name: Date of Service: [name] 07/14/2019 2:00 PM Medical Record [id number] Patient Account Number: [id number] Date of Birth/Sex: Treating RN: 11/08/1955 (64 y.o. [name] Primary Care Revia Nghiem: [name] Other Clinician: Referring Shone Leventhal: Treating Katelynn Heidler/Extender:Robson,  [name], [name] in Treatment: 50 Compression Therapy Performed for Wound Wound #6 Right,Proximal,Posterior Lower Leg Assessment: Performed By: Clinician Baruch Gouty, RN Compression Type: Three Layer Post Procedure Diagnosis Same as Pre-procedure Electronic Signature(s) Signed: 07/14/2019 6:05:55 PM By: Deon Pilling Entered By: Deon Pilling on 07/14/2019 15:02:17 -------------------------------------------------------------------------------- Compression Therapy Details Patient Name: Date of Service: [name], [name] 07/14/2019 2:00 PM Medical Record [id number] Patient Account Number: [id number] Date of Birth/Sex: Treating RN: Nov 30, 1955 (64 y.o. [name] Primary Care Keiara Sneeringer: [name] Other Clinician: Referring Chatara Lucente: Treating Tyreka Henneke/Extender:Robson, [name], [name] in Treatment: 50 Compression Therapy Performed for Wound Wound #8R Left,Anterior Lower Leg Assessment: Performed By: Clinician Baruch Gouty, RN Compression Type: Three Layer Post Procedure Diagnosis Same as Pre-procedure Electronic Signature(s) Signed: 07/14/2019 6:05:55 PM By: Deon Pilling Entered By: Deon Pilling on 07/14/2019 15:02:17 -------------------------------------------------------------------------------- Encounter Discharge Information Details Patient Name: Date of Service: [name] 07/14/2019 2:00 PM Medical Record [id number] Patient Account Number: [id number] Date of Birth/Sex: Treating RN: 06/25/1955 (64 y.o. [name] Primary Care Ananth Fiallos: [name] Other Clinician: Referring Geza Beranek: Treating Zaelynn Fuchs/Extender:Robson, [name], [name] in Treatment: 45 Encounter Discharge Information Items Discharge Condition: Stable Ambulatory Status: Ambulatory Discharge Destination: Home Transportation: Private Auto Accompanied By: self Schedule Follow-up Appointment:  Yes Clinical Summary of Care: Patient Declined Electronic Signature(s) Signed: 07/15/2019 8:33:08 AM By: Baruch Gouty RN, BSN Entered By: Baruch Gouty on 07/14/2019 15:21:04 -------------------------------------------------------------------------------- Lower Extremity Assessment Details Patient Name: Date of Service: [name], [name] 07/14/2019 2:00 PM Medical Record [id number] Patient Account Number: [id number] Date of Birth/Sex: Treating RN: 21-Oct-1955 (64 y.o. [name] Primary Care Brody Kump: [name] Other Clinician: Referring Daria Mcmeekin: Treating Shelagh Rayman/Extender:Robson, [name], [name] in Treatment: 50 Edema Assessment Assessed: [Left: No] [Right: No] Edema: [Left: Yes] [Right: Yes] Calf Left: Right: Point of Measurement: 45 cm From Medial Instep 39.5 cm 41.5 cm Ankle Left: Right: Point of Measurement: 13 cm From Medial Instep 23 cm 23.5 cm Vascular Assessment Pulses: Dorsalis Pedis Palpable: [Left:Yes] [Right:Yes] Electronic  Signature(s) Signed: 07/14/2019 5:20:11 PM By: Kela Millin Entered By: Kela Millin on 07/14/2019 14:35:23 -------------------------------------------------------------------------------- Multi-Disciplinary Care Plan Details Patient Name: Date of Service: [name] 07/14/2019 2:00 PM Medical Record [id number] Patient Account Number: [id number] Date of Birth/Sex: Treating RN: 01-14-1956 (64 y.o. [name] Primary Care Vince Ainsley: [name] Other Clinician: Referring Malessa Zartman: Treating Brynden Thune/Extender:Robson, [name], [name] in Treatment: 50 Active Inactive Venous Leg Ulcer Nursing Diagnoses: Actual venous Insuffiency (use after diagnosis is confirmed) Knowledge deficit related to disease process and management Goals: Non-invasive venous studies are completed as ordered Date Initiated: 09/17/2018 Date Inactivated: 09/23/2018 Target  Resolution Date: 09/23/2018 Goal Status: Met Patient will maintain optimal edema control Date Initiated: 09/17/2018 Target Resolution Date: 09/09/2019 Goal Status: Active Patient/caregiver will verbalize understanding of disease process and disease management Date Initiated: 09/17/2018 Date Inactivated: 09/23/2018 Target Resolution Date: 10/15/2018 Goal Status: Met Interventions: Assess peripheral edema status every visit. Compression as ordered Provide education on venous insufficiency Treatment Activities: Therapeutic compression applied : 09/17/2018 Notes: Electronic Signature(s) Signed: 07/14/2019 6:05:55 PM By: Deon Pilling Entered By: Deon Pilling on 07/14/2019 13:52:33 -------------------------------------------------------------------------------- Pain Assessment Details Patient Name: Date of Service: LYELL, CLUGSTON 07/14/2019 2:00 PM Medical Record [id number] Patient Account Number: [id number] Date of Birth/Sex: Treating RN: 1956/01/25 (64 y.o. [name] Primary Care Ludean Duhart: [name] Other Clinician: Referring Zackeriah Kissler: Treating Kirin Brandenburger/Extender:Robson, [name], [name] in Treatment: 50 Active Problems Location of Pain Severity and Description of Pain Patient Has Paino No Site Locations Pain Management and Medication Current Pain Management: Electronic Signature(s) Signed: 07/14/2019 5:20:11 PM By: Kela Millin Entered By: Kela Millin on 07/14/2019 14:42:27 -------------------------------------------------------------------------------- Patient/Caregiver Education Details Patient Name: Date of Service: Snavely, Mayson J. 1/28/2021andnbsp2:00 PM Medical Record [id number] Patient Account Number: [id number] Date of Birth/Gender: Treating RN: [birth year] (63 y.o. [name] Primary Care Physician: [name] Other Clinician: Referring Physician: Treating Physician/Extender:Robson,  [name], [name] in Treatment: 14 Education Assessment Education Provided To: Patient Education Topics Provided Wound/Skin Impairment: Handouts: Caring for Your Ulcer Methods: Explain/Verbal Responses: Reinforcements needed Electronic Signature(s) Signed: 07/14/2019 6:05:55 PM By: Deon Pilling Entered By: Deon Pilling on 07/14/2019 13:52:46 -------------------------------------------------------------------------------- Wound Assessment Details Patient Name: Date of Service: TYTON, ABDALLAH 07/14/2019 2:00 PM Medical Record [id number] Patient Account Number: [id number] Date of Birth/Sex: Treating RN: [birth year] (64 y.o. M) Primary Care Keshia Weare: [name] Other Clinician: Referring Calie Buttrey: Treating Zyriah Mask/Extender:Robson, [name], [name] in Treatment: 50 Wound Status Wound Number: 6 Primary Venous Leg Ulcer Etiology: Wound Location: Right Lower Leg - Posterior, Proximal Wound Open Status: Wounding Event: Gradually Appeared Comorbid Asthma, Sleep Apnea, Arrhythmia, Date Acquired: 01/06/2019 History: Congestive Heart Failure, Hypertension, Type Weeks Of Treatment: 27 II Diabetes Clustered Wound: Yes Photos Wound Measurements Length: (cm) 0.2 % Reduct Width: (cm) 0.2 % Reduct Depth: (cm) 0.1 Epitheli Clustered Quantity: 2 Tunnelin Area: (cm) 0.031 Undermi Volume: (cm) 0.003 Wound Description Full Thickness Without Exposed Support Foul Odo Classification: Structures Slough/F Wound Flat and Intact Margin: Exudate Large Amount: Exudate Sanguinous Type: Exudate red Color: Wound Bed Granulation Amount: Large (67-100%) Granulation Quality: Red, Pink Fascia E Necrotic Amount: None Present (0%) Fat Laye Tendon E Muscle E Joint Expo Bone Expos r After Cleansing: No ibrino No Exposed Structure xposed: No r (Subcutaneous Tissue) Exposed: Yes xposed: No xposed: No sed: No ed: No ion in Area:  92.1% ion in Volume: 92.3% alization: Small (1-33%) g: No ning: No Treatment Notes Wound #6 (Right, Proximal, Posterior Lower Leg) 2. Periwound Care Moisturizing lotion 3.  Primary Dressing Applied Hydrofera Blue 4. Secondary Dressing Dry Gauze 6. Support Layer Applied 3 layer compression wrap Electronic Signature(s) Signed: 07/19/2019 5:19:10 PM By: Mikeal Hawthorne EMT/HBOT Previous Signature: 07/14/2019 5:20:11 PM Version By: Kela Millin Entered By: Mikeal Hawthorne on 07/19/2019 11:43:30 -------------------------------------------------------------------------------- Wound Assessment Details Patient Name: Date of Service: [name] 07/14/2019 2:00 PM Medical Record [id number] Patient Account Number: [id number] Date of Birth/Sex: Treating RN: 13-May-1956 (64 y.o. M) Primary Care Hilery Wintle: [name] Other Clinician: Referring Carine Nordgren: Treating Kellyanne Ellwanger/Extender:Robson, [name], [name] in Treatment: 50 Wound Status Wound Number: 8R Primary Venous Leg Ulcer Etiology: Wound Location: Left Lower Leg - Anterior Wound Open Wounding Event: Gradually Appeared Status: Date Acquired: 02/14/2019 Comorbid Asthma, Sleep Apnea, Arrhythmia, Weeks Of Treatment: 21 History: Congestive Heart Failure, Hypertension, Type Clustered Wound: No II Diabetes Photos Wound Measurements Length: (cm) 0.9 % Reduction Width: (cm) 0.4 % Reduction Depth: (cm) 0.1 Epitheliali Area: (cm) 0.283 Tunneli Volume: (cm) 0.028 Undermi Wound Description Full Thickness Without Exposed Support Foul Od Classification: Structures Slough/ Wound Flat and Intact Margin: Exudate Medium Amount: Exudate Serosanguineous Type: Exudate red, brown Color: Wound Bed Granulation Amount: Large (67-100%) Granulation Quality: Red, Pink Fascia E Necrotic Amount: None Present (0%) Fat Laye Tendon E Muscle E Joint Ex Bone Exp or After Cleansing: No Fibrino No Exposed  Structure xposed: No r (Subcutaneous Tissue) Exposed: Yes xposed: No xposed: No posed: No osed: No in Area: 95.5% in Volume: 95.5% zation: Small (1-33%) ng: No ning: No Treatment Notes Wound #8R (Left, Anterior Lower Leg) 2. Periwound Care Moisturizing lotion 3. Primary Dressing Applied Hydrofera Blue 4. Secondary Dressing Dry Gauze 6. Support Layer Applied 3 layer compression wrap Electronic Signature(s) Signed: 07/19/2019 5:19:10 PM By: Mikeal Hawthorne EMT/HBOT Previous Signature: 07/14/2019 5:20:11 PM Version By: Kela Millin Entered By: Mikeal Hawthorne on 07/19/2019 11:44:30 -------------------------------------------------------------------------------- Vitals Details Patient Name: Date of Service: [name]. 07/14/2019 2:00 PM Medical Record [id number] Patient Account Number: [id number] Date of Birth/Sex: Treating RN: 1956-05-23 (64 y.o. [name] Primary Care [name] [name]: [name] Other Clinician: Referring Ozelle Brubacher: Treating Sherrick Araki/Extender:Robson, [name], [name] in Treatment: 50 Vital Signs Time Taken: 14:25 Temperature (F): 98.3 Height (in): 73 Pulse (bpm): 47 Weight (lbs): 290 Respiratory Rate (breaths/min): 19 Body Mass Index (BMI): 38.3 Blood Pressure (mmHg): 149/93 Reference Range: 80 - 120 mg / dl Electronic Signature(s) Signed: 07/14/2019 5:20:11 PM By: Kela Millin Entered By: Kela Millin on 07/14/2019 14:28:13

## 2019-07-21 ENCOUNTER — Encounter (HOSPITAL_BASED_OUTPATIENT_CLINIC_OR_DEPARTMENT_OTHER): Payer: BC Managed Care – PPO | Attending: Internal Medicine | Admitting: Internal Medicine

## 2019-07-21 ENCOUNTER — Other Ambulatory Visit: Payer: Self-pay

## 2019-07-21 DIAGNOSIS — L97822 Non-pressure chronic ulcer of other part of left lower leg with fat layer exposed: Secondary | ICD-10-CM | POA: Insufficient documentation

## 2019-07-21 DIAGNOSIS — Z8616 Personal history of COVID-19: Secondary | ICD-10-CM | POA: Insufficient documentation

## 2019-07-21 DIAGNOSIS — S81802A Unspecified open wound, left lower leg, initial encounter: Secondary | ICD-10-CM | POA: Diagnosis not present

## 2019-07-21 DIAGNOSIS — J45909 Unspecified asthma, uncomplicated: Secondary | ICD-10-CM | POA: Insufficient documentation

## 2019-07-21 DIAGNOSIS — E11622 Type 2 diabetes mellitus with other skin ulcer: Secondary | ICD-10-CM | POA: Insufficient documentation

## 2019-07-21 DIAGNOSIS — I872 Venous insufficiency (chronic) (peripheral): Secondary | ICD-10-CM | POA: Diagnosis not present

## 2019-07-21 DIAGNOSIS — I110 Hypertensive heart disease with heart failure: Secondary | ICD-10-CM | POA: Diagnosis not present

## 2019-07-21 DIAGNOSIS — I429 Cardiomyopathy, unspecified: Secondary | ICD-10-CM | POA: Insufficient documentation

## 2019-07-21 DIAGNOSIS — I4891 Unspecified atrial fibrillation: Secondary | ICD-10-CM | POA: Insufficient documentation

## 2019-07-21 DIAGNOSIS — E875 Hyperkalemia: Secondary | ICD-10-CM | POA: Insufficient documentation

## 2019-07-21 DIAGNOSIS — L97212 Non-pressure chronic ulcer of right calf with fat layer exposed: Secondary | ICD-10-CM | POA: Insufficient documentation

## 2019-07-21 DIAGNOSIS — I87311 Chronic venous hypertension (idiopathic) with ulcer of right lower extremity: Secondary | ICD-10-CM | POA: Insufficient documentation

## 2019-07-21 DIAGNOSIS — Z7901 Long term (current) use of anticoagulants: Secondary | ICD-10-CM | POA: Diagnosis not present

## 2019-07-21 DIAGNOSIS — G4730 Sleep apnea, unspecified: Secondary | ICD-10-CM | POA: Diagnosis not present

## 2019-07-21 DIAGNOSIS — L97219 Non-pressure chronic ulcer of right calf with unspecified severity: Secondary | ICD-10-CM | POA: Diagnosis not present

## 2019-07-21 DIAGNOSIS — I5022 Chronic systolic (congestive) heart failure: Secondary | ICD-10-CM | POA: Insufficient documentation

## 2019-07-21 DIAGNOSIS — I890 Lymphedema, not elsewhere classified: Secondary | ICD-10-CM | POA: Diagnosis not present

## 2019-07-21 NOTE — Progress Notes (Signed)
[name], [name] ([name]) Visit Report for 07/21/2019 HPI Details Patient Name: Date of Service: [name], [name] 07/21/2019 2:15 PM Medical Record [name] Patient Account Number: [id number] Date of Birth/Sex: Treating RN: 27-Nov-1955 (64 y.o. M) Primary Care Provider: [name] Other Clinician: Referring Provider: Treating Provider/Extender:Sophia Sperry, [name], [name] in Treatment: 25 History of Present Illness HPI Description: ADMISSION 07/29/2018 [name] is a 64 year old man with either prediabetes or diabetes he is on glipizide. In late October to November 2019 he noted a scabbed area on the back of his right calf. He picked this off a few times but it would not heal. He saw his primary physician on 12/5. It was felt at that time that this may actually heal on its own with conservative management. The next visit was on 07/20/2018 noted the area was a lot worse and arranged for his treatment here. He has been topical antibiotics like Neosporin although he stopped using this when the wound looked worse. He is just been covering this with a clean Band-Aid. He is given Bactrim a week ago and he is finishing this currently. It is made some improvement in the surrounding erythema per the patient. He does not have a history of nonhealing wounds. No prior history of wounds on his legs that he had difficulty healing. No prior skin issues. 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 Past medical history; A. fib status post ablation and recent cardioversion he is on Xarelto. He has a history of systolic heart failure, cervical radiculopathy, cardiomyopathy and type 2 diabetes as discussed ABI in the right leg was noncompressible on the right 2/20; the biopsy I did on the patient last week was negative for malignancy. Culture grew Pseudomonas and methicillin sensitive staph aureus. He is on cefdinir 300 twice a day. I will have to make this a 10 day  course. He put him in compression. He states that the redness pain and erythema are a lot better. I suspect the patient has chronic venous insufficiency probably with a secondary cellulitis 2/27; arrives today with copious amounts of drainage coming out of the wound irritating the skin below the wound area. He only renewed the final 3 days of cefdinir today 3/5; came in for a nurse change 3 days ago. Again a lot of drainage noted. Zinc oxide was applied unfortunately the drainage appears to a pool then he has a string of superficial open areas extending down into the Achilles area and some just below the wound. The wound itself does not look too bad. He is completed the antibiotics although apparently there was separation from the original 7 with the last 3 days. Culture grew a few MSSA and a few Pseudomonas 3/12; not too much difference over the last week. He came in for a dressing change on Monday by our nursing staff. Necrotic debris again over the wound surface. The entire area looks irritated but nontender and I do not think shows obvious evidence of infection. We have not heard anything about the reflux studies. Also notable than in this diabetic man we had noncompressible vessels and although I can feel his pulses easily in his feet I will order arterial studies as well. 3/19-Patient had experience more pain has been dressed with a silver alginate, the wounds all have necrotic debris, very friable with easy bleeding with any kind of surface debridement including with gauze and Anasept and with a #3 curette. His vascular appointments unfortunately were all canceled on account of  the virus outbreak resulting in studies only being done for emergent cases. His pulses are easily palpable in the lower extremity. He has open areas below the primary wound where he states the drainage which included purulent material also with some blood pooled and caused breakdown. 3/26; the patient was changed  to Santyl with [facility] backing last week. This was largely because the silver alginate was sticking to the wound. He is still having quite a bit of pain. He tells [place] that the reflux studies and arterial studies we had attempted to arrange through vein and vascular are not being booked until mid May. Culture that was done last week showed both Serratia moderate and a few methicillin sensitive staph aureus I started him on Bactrim DS 1 p.o. twice daily on 3/23 for 7 days. He is here for follow-up. 4/3; patient is on Santyl with Hydrofera Blue. Patient complains of pain. Our intake nurse is noted drainage and some odor. He has completed Bactrim recently for Serratia and a few methicillin sensitive staph aureus. After our staff push hard last week we were able to get venous studies as well as arterial studies. His arterial studies showed on the right great toe pressure of 0.86. On the left ABI at 1.47 TBI of 0.87. On both sides waveforms were triphasic VENOUS STUDIES showed reflux in the femoral vein, popliteal vein great saphenous vein at the saphenofemoral junction and great saphenous vein at the proximal thigh. Also noted to have age-indeterminate superficial vein thrombosis involving the small saphenous vein. Notable that the patient is already on Xarelto for atrial fibrillation. 4/9; the patient has been to see vascular surgery and reviewed by Dr. [name]. He was felt to have only minimal superficial venous reflux in the right great greater saphenous vein. For this reason he was not felt to be a candidate for laser ablation of the right greater saphenous vein. A wedge cushion was suggested to keep his leg elevated. He does have deep venous reflux on the right. Culture I did last time showed a combination of Serratia Pseudomonas and methicillin sensitive staph aureus. We have been using Hydrofera Blue to the large wound, Santyl to some of the deep punched out satellite lesions distal to it.  There is some improvement [id number]; the application for Apligraf was put out for further review for material that we have resubmitted. He has the deep area on the right posterior calf which is large and then 3 small punched out areas beneath this. In general his wounds look a lot better 4/23; the patient has his large original wound and then 2 punched out satellite lesions below it on the right posterior calf. I was usually able to use Apligraf #1 to cover the full surface area of the larger wound. We continued with Santyl and Hydrofera Blue on the 2 punched-out areas 5/7; patient has his large original wound and 2 punched out satellite lesions below it in the right posterior calf. Apligraf #2 today. Major improvement in the big wound in terms of wound depth. Punched-out [place] wounds have a very healthy looking wound bed. 5/21-Patient comes back for his large right posterior calf wound for which she has been an Apligraf #3 today. Wound depth seems to be better, the punched-out wounds appear to have healthy bed with some bleeding 6/4; right posterior calf wound is much better. Apligraf #4 today. The major wound has no wound depth at all. Only 1 of the satellite lesions is still open. The patient has very  high blood pressure coming in today with a diastolic blood pressure of 130 after lying there for a few minutes it was down to 110. He states that is running [id number] diastolic at home. He was high last week as well I thought he was on 50 mg 1/2 tablet twice daily of losartan I told him to double up on that however it turns out he is actually on 50 twice daily. Course he is run out of his medications prematurely. He has a follow-up with his primary's office next Wednesday. 6/18; patient's wounds continue to improve. The small area laterally is just about closed and there is continued epithelialization on the area on the posterior calf. 7/2; we applied his last Apligraf last visit. Early the next week  there was a lot of purulent looking drainage that I cultured this grew staph and Pseudomonas however when we had him back for the next visit to 3 days later everything looked a lot better. He did not receive systemic antibiotics. Fortunately we did not have to remove the Apligraf. He has had heart trouble this week he was having an ablation apparently they have discovered a clot in his left atrium they have changed all his anticoagulants as well as his blood pressure medication. 7/16; using Hydrofera Blue. We are making nice progress towards closure. He has not yet ordered his compression stockings he has his measurements 7/23; dimensions are better. He has a new satellite area superiorly. Both wounds cauterized with silver nitrate. 7/30; absolutely no change in the major wound on the posterior part of the right calf. We have been using Hydrofera Blue for a prolonged period of time and really had a nice improvement but over the last few weeks this is stalled. There is no depth. He arrived in clinic today with a new area on the right anterior tibia which apparently was an "ingrown hair". It is clearly an open area. This looks like a wrap injury although he was not really aware of it. 8/31; since the patient was last in clinic he was admitted to hospital from 8/17 through 8/22. He had had multiple falls at home including one off a ladder. He was admitted to hospital with mild COVID-19 viral pneumonia. Noted to be hyperkalemic and in acute renal failure. He has chronic systolic heart failure with ejection fraction of 35%. He arrives back in clinic with the original wound on the posterior aspect of the right lower calf looking a lot better. He has eschared areas from falls and lacerations on his anterior knees bilaterally just below the patella and on the mid part of his tibia bilaterally. He seems to have a small area on the right lateral ankle as well 9/10- Patient comes to clinic with a 2 new  wounds one on the left anterior shin and one on the right posterior leg both the left knee and right knee areas are healed up. We have been using Santyl to the left anterior leg and silver alginate to right posterior leg wounds. 9/17; the patient is original wound looks good on the posterior right calf. He has traumatic areas on the left anterior shin left anterior patellar tendon and the right mid tibia area. Except for the right posterior calf all required debridement. We have been using Santyl on these areas and silver alginate posteriorly right calf 9/24; the original wound on the posterior calf on the right looks good. This is healthy. There are traumatic wounds in the left anterior shin, left anterior  patella in the right mid tibia area are about the same. He has dusky erythema on the left anterior tibia which led me to give him antibiotics last week. This does not look too much different. This is nontender and I wonder if this is all just venous inflammation. 10/2; the patient has 2 open areas on the right posterior calf both of these look good we have been using silver alginate. He has traumatic wounds on the left and right mid tibia areas. The surface of these wounds is still not ready for closure. We have been using Iodoflex. Finally has areas on the left knee. The latter wounds are all traumatic after a fall 10/9. His original wounds of the right posterior calf are superficial and progressing towards closure. His traumatic wounds on the left anterior tibia and right anterior tibia are considerably improved in terms of the wound surface. He asked me about a skin graft in these areas I do not think this is going to be necessary. Change from Iodosorb to [facility]. The area on the left knee is just about closed as well 10/16; his original wound on the right posterior calf very superficial looks healthy. He has more recent traumatic wounds on the left anterior tibia and right anterior  tibia both of these have better looking surfaces and measuring smaller. Might be beneficial for an advanced treatment product. The area on his left knee has a small superficial scab and we are going to close that when 10/23; his original wound areas on the right posterior calf continue to improve. The more recently traumatic wounds on the left anterior tibia and right anterior tibia both look better in terms of surfaces. No debridement required. We have been using Hydrofera Blue. He is approved for Apligraf 11/3; Apligraf #1 applied to the left anterior tibia right anterior tibia and to 2 small areas remaining on the right posterior calf which were part of his initial wound area. 11/19; Apligraf #2. Left anterior tibia is healed. Right anterior tibia much better. Only one small area remains on the right posterior calf which was part of his initial wound area 12/3; the left anterior tibia has opened up again. Hyper granulated nodule. Right anterior tibia is superficial and larger. There is no depth of this. I did not place Apligraf today return to [facility] under compression 12/10; the patient has 2 remaining wounds 1 on the right posterior and the other on the left anterior. Both of these look quite good. No debridement is required we have been using Hydrofera Blue under compression. 12/17; the patient has really not closed over which is disappointing. His wraps were very tight and there may be some wrap injury issues. On the left he has the original wound on the medial mid tibia he has a wrap injury on the lateral mid tibia. On the right his posterior areas are superficial but certainly not closed 12/31; the patient has totally closed on the left. He will transition to his own 20/30 below-knee stocking on the left leg today. Interestingly on the right posterior calf the 2 wounds that he had from last time of closed however there is an additional injury in this same area. Almost looks as  though there is subcutaneous fluid/blistering. There is no evidence of cellulitis this does not seem tender 06/24/2019; the patient comes in having been placed in his 20/30 stockings on the left leg last week. He states that he noted blisters break open on Saturday and they had  opened into wounds on the anterior tibial area on the left by Monday. The area on the posterior right leg looks satisfactory we are still doing that and compression. We are using calcium alginate to all wound areas 1/14; the patient had juxta light however one of them was not the right size which will need to be returned. The new area last week on the left anterior tibia looks a lot better it measures smaller I would say the area on the right is about the same, this is on the right posterior calf. We have been using silver alginate 1/21; he has his bilateral juxta lites however the left anterior wound continues to look better where is the 2 areas on the right posterior calf look about the same. We have been using Hydrofera Blue under compression with the juxta lite stockings now on standby 2/4; the areas on the left are totally healed and he will be discharged into his own stocking/juxta light. On the right posterior calf he has what looks to be a blister with a small area of subdermal hemorrhage this is very tiny but I did I am going to wrap him today and see if this will be reabsorbed by next week Electronic Signature(s) Signed: 07/21/2019 5:50:02 PM By: Linton Ham MD Entered By: Linton Ham on 07/21/2019 16:12:53 -------------------------------------------------------------------------------- Physical Exam Details Patient Name: Date of Service: [name] 07/21/2019 2:15 PM Medical Record [id number] Patient Account Number: [id number] Date of Birth/Sex: Treating RN: 09/19/1955 (64 y.o. M) Primary Care Provider: [name] Other Clinician: Referring Provider: Treating  Provider/Extender:Cassidy Tashiro, [name], [name] in Treatment: 51 Notes Wound exam Still a area on the right posterior calf which looks like a blister with some subdermal hemorrhage. No debridement I am going to put this under compression and see if it would lives or On the left anterior tibia this is all closed Electronic Signature(s) Signed: 07/21/2019 5:50:02 PM By: Linton Ham MD Entered By: Linton Ham on 07/21/2019 16:13:38 -------------------------------------------------------------------------------- Physician Orders Details Patient Name: Date of Service: [name] 07/21/2019 2:15 PM Medical Record [id number] Patient Account Number: [id number] Date of Birth/Sex: Treating RN: 29-Nov-1955 (64 y.o. [name], Tammi Klippel Primary Care Provider: [name] Other Clinician: Referring Provider: Treating Provider/Extender:Tashiba Timoney, [name], [name] in Treatment: 78 Verbal / Phone Orders: No Diagnosis Coding ICD-10 Coding Code Description [phone] Non-pressure chronic ulcer of right calf with fat layer exposed E11.622 Type 2 diabetes mellitus with other skin ulcer I87.321 Chronic venous hypertension (idiopathic) with inflammation of right lower extremity S81.811D Laceration without foreign body, right lower leg, subsequent encounter L97.822 Non-pressure chronic ulcer of other part of left lower leg with fat layer exposed Follow-up Appointments Return Appointment in 1 week. Dressing Change Frequency Do not change entire dressing for one week. Skin Barriers/Peri-Wound Care Moisturizing lotion - both legs patient to lotion twice a day to left leg. Wound Cleansing May shower with protection. Primary Wound Dressing Wound #6 Right,Proximal,Posterior Lower Leg Other: - gauze Secondary Dressing Wound #6 Right,Proximal,Posterior Lower Leg Dry Gauze Edema Control 3 Layer Compression System - Right Lower Extremity Avoid standing for  long periods of time Elevate legs to the level of the heart or above for 30 minutes daily and/or when sitting, a frequency of: - 3-4 times a day. Exercise regularly Support Garment 30-40 mm/Hg pressure to: - patient to apply left leg juxtalite HD daily. apply in the morning and remove at night. Electronic Signature(s) Signed: 07/21/2019 5:50:02 PM By: Linton Ham MD Signed: 07/21/2019 5:56:18  PM By: Deon Pilling Entered By: Deon Pilling on 07/21/2019 15:25:26 -------------------------------------------------------------------------------- Problem List Details Patient Name: Date of Service: [name] 07/21/2019 2:15 PM Medical Record [id number] Patient Account Number: [id number] Date of Birth/Sex: Treating RN: [birth year] (64 y.o. [name], [name] Primary Care Provider: [name] Other Clinician: Referring Provider: Treating Provider/Extender:Caitriona Sundquist, [name], [name] in Treatment: 51 Active Problems ICD-10 Evaluated Encounter Code Description Active Date Today Diagnosis L97.212 Non-pressure chronic ulcer of right calf with fat layer 07/29/2018 No Yes exposed E11.622 Type 2 diabetes mellitus with other skin ulcer 07/29/2018 No Yes I87.321 Chronic venous hypertension (idiopathic) with 07/29/2018 No Yes inflammation of right lower extremity S81.811D Laceration without foreign body, right lower leg, 02/14/2019 No Yes subsequent encounter L97.822 Non-pressure chronic ulcer of other part of left lower 06/30/2019 No Yes leg with fat layer exposed Inactive Problems ICD-10 Code Description Active Date Inactive Date S81.812D Laceration without foreign body, left lower leg, subsequent 02/14/2019 02/14/2019 encounter L03.115 Cellulitis of right lower limb 09/24/2018 09/24/2018 Resolved Problems Electronic Signature(s) Signed: 07/21/2019 5:50:02 PM By: Linton Ham MD Entered By: Linton Ham on 07/21/2019  16:11:52 -------------------------------------------------------------------------------- Progress Note Details Patient Name: Date of Service: [name] 07/21/2019 2:15 PM Medical Record [id number] Patient Account Number: [id number] Date of Birth/Sex: Treating RN: 06/06/1956 (64 y.o. M) Primary Care Provider: [name] Other Clinician: Referring Provider: Treating Provider/Extender:Ara Mano, [name], [name] in Treatment: 51 Subjective History of Present Illness (HPI) ADMISSION 07/29/2018 Mr. Romick is a 64 year old man with either prediabetes or diabetes he is on glipizide. In late October to November 2019 he noted a scabbed area on the back of his right calf. He picked this off a few times but it would not heal. He saw his primary physician on 12/5. It was felt at that time that this may actually heal on its own with conservative management. The next visit was on 07/20/2018 noted the area was a lot worse and arranged for his treatment here. He has been topical antibiotics like Neosporin although he stopped using this when the wound looked worse. He is just been covering this with a clean Band-Aid. He is given Bactrim a week ago and he is finishing this currently. It is made some improvement in the surrounding erythema per the patient. He does not have a history of nonhealing wounds. No prior history of wounds on his legs that he had difficulty healing. No prior skin issues. 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 Past medical history; A. fib status post ablation and recent cardioversion he is on Xarelto. He has a history of systolic heart failure, cervical radiculopathy, cardiomyopathy and type 2 diabetes as discussed ABI in the right leg was noncompressible on the right 2/20; the biopsy I did on the patient last week was negative for malignancy. Culture grew Pseudomonas and methicillin sensitive staph aureus. He is on cefdinir 300  twice a day. I will have to make this a 10 day course. He put him in compression. He states that the redness pain and erythema are a lot better. I suspect the patient has chronic venous insufficiency probably with a secondary cellulitis 2/27; arrives today with copious amounts of drainage coming out of the wound irritating the skin below the wound area. He only renewed the final 3 days of cefdinir today 3/5; came in for a nurse change 3 days ago. Again a lot of drainage noted. Zinc oxide was applied unfortunately the drainage appears to a pool then he has  a string of superficial open areas extending down into the Achilles area and some just below the wound. The wound itself does not look too bad. He is completed the antibiotics although apparently there was separation from the original 7 with the last 3 days. Culture grew a few MSSA and a few Pseudomonas 3/12; not too much difference over the last week. He came in for a dressing change on Monday by our nursing staff. Necrotic debris again over the wound surface. The entire area looks irritated but nontender and I do not think shows obvious evidence of infection. We have not heard anything about the reflux studies. Also notable than in this diabetic man we had noncompressible vessels and although I can feel his pulses easily in his feet I will order arterial studies as well. 3/19-Patient had experience more pain has been dressed with a silver alginate, the wounds all have necrotic debris, very friable with easy bleeding with any kind of surface debridement including with gauze and Anasept and with a #3 curette. His vascular appointments unfortunately were all canceled on account of the virus outbreak resulting in studies only being done for emergent cases. His pulses are easily palpable in the lower extremity. He has open areas below the primary wound where he states the drainage which included purulent material also with some blood pooled and  caused breakdown. 3/26; the patient was changed to Santyl with [facility] backing last week. This was largely because the silver alginate was sticking to the wound. He is still having quite a bit of pain. He tells [place] that the reflux studies and arterial studies we had attempted to arrange through vein and vascular are not being booked until mid May. Culture that was done last week showed both Serratia moderate and a few methicillin sensitive staph aureus I started him on Bactrim DS 1 p.o. twice daily on 3/23 for 7 days. He is here for follow-up. 4/3; patient is on Santyl with Hydrofera Blue. Patient complains of pain. Our intake nurse is noted drainage and some odor. He has completed Bactrim recently for Serratia and a few methicillin sensitive staph aureus. After our staff push hard last week we were able to get venous studies as well as arterial studies. His arterial studies showed on the right great toe pressure of 0.86. On the left ABI at 1.47 TBI of 0.87. On both sides waveforms were triphasic VENOUS STUDIES showed reflux in the femoral vein, popliteal vein great saphenous vein at the saphenofemoral junction and great saphenous vein at the proximal thigh. Also noted to have age-indeterminate superficial vein thrombosis involving the small saphenous vein. Notable that the patient is already on Xarelto for atrial fibrillation. 4/9; the patient has been to see vascular surgery and reviewed by Dr. [name]. He was felt to have only minimal superficial venous reflux in the right great greater saphenous vein. For this reason he was not felt to be a candidate for laser ablation of the right greater saphenous vein. A wedge cushion was suggested to keep his leg elevated. He does have deep venous reflux on the right. Culture I did last time showed a combination of Serratia Pseudomonas and methicillin sensitive staph aureus. We have been using Hydrofera Blue to the large wound, Santyl to some of the  deep punched out satellite lesions distal to it. There is some improvement [id number]; the application for Apligraf was put out for further review for material that we have resubmitted. He has the deep area on the right  posterior calf which is large and then 3 small punched out areas beneath this. In general his wounds look a lot better 4/23; the patient has his large original wound and then 2 punched out satellite lesions below it on the right posterior calf. I was usually able to use Apligraf #1 to cover the full surface area of the larger wound. We continued with Santyl and Hydrofera Blue on the 2 punched-out areas 5/7; patient has his large original wound and 2 punched out satellite lesions below it in the right posterior calf. Apligraf #2 today. Major improvement in the big wound in terms of wound depth. Punched-out [place] wounds have a very healthy looking wound bed. 5/21-Patient comes back for his large right posterior calf wound for which she has been an Apligraf #3 today. Wound depth seems to be better, the punched-out wounds appear to have healthy bed with some bleeding 6/4; right posterior calf wound is much better. Apligraf #4 today. The major wound has no wound depth at all. Only 1 of the satellite lesions is still open. The patient has very high blood pressure coming in today with a diastolic blood pressure of 130 after lying there for a few minutes it was down to 110. He states that is running [id number] diastolic at home. He was high last week as well I thought he was on 50 mg 1/2 tablet twice daily of losartan I told him to double up on that however it turns out he is actually on 50 twice daily. Course he is run out of his medications prematurely. He has a follow-up with his primary's office next Wednesday. 6/18; patient's wounds continue to improve. The small area laterally is just about closed and there is continued epithelialization on the area on the posterior calf. 7/2; we applied  his last Apligraf last visit. Early the next week there was a lot of purulent looking drainage that I cultured this grew staph and Pseudomonas however when we had him back for the next visit to 3 days later everything looked a lot better. He did not receive systemic antibiotics. Fortunately we did not have to remove the Apligraf. He has had heart trouble this week he was having an ablation apparently they have discovered a clot in his left atrium they have changed all his anticoagulants as well as his blood pressure medication. 7/16; using Hydrofera Blue. We are making nice progress towards closure. He has not yet ordered his compression stockings he has his measurements 7/23; dimensions are better. He has a new satellite area superiorly. Both wounds cauterized with silver nitrate. 7/30; absolutely no change in the major wound on the posterior part of the right calf. We have been using Hydrofera Blue for a prolonged period of time and really had a nice improvement but over the last few weeks this is stalled. There is no depth. He arrived in clinic today with a new area on the right anterior tibia which apparently was an "ingrown hair". It is clearly an open area. This looks like a wrap injury although he was not really aware of it. 8/31; since the patient was last in clinic he was admitted to hospital from 8/17 through 8/22. He had had multiple falls at home including one off a ladder. He was admitted to hospital with mild COVID-19 viral pneumonia. Noted to be hyperkalemic and in acute renal failure. He has chronic systolic heart failure with ejection fraction of 35%. He arrives back in clinic with the original wound on  the posterior aspect of the right lower calf looking a lot better. He has eschared areas from falls and lacerations on his anterior knees bilaterally just below the patella and on the mid part of his tibia bilaterally. He seems to have a small area on the right lateral ankle as  well 9/10- Patient comes to clinic with a 2 new wounds one on the left anterior shin and one on the right posterior leg both the left knee and right knee areas are healed up. We have been using Santyl to the left anterior leg and silver alginate to right posterior leg wounds. 9/17; the patient is original wound looks good on the posterior right calf. He has traumatic areas on the left anterior shin left anterior patellar tendon and the right mid tibia area. Except for the right posterior calf all required debridement. We have been using Santyl on these areas and silver alginate posteriorly right calf 9/24; the original wound on the posterior calf on the right looks good. This is healthy. There are traumatic wounds in the left anterior shin, left anterior patella in the right mid tibia area are about the same. He has dusky erythema on the left anterior tibia which led me to give him antibiotics last week. This does not look too much different. This is nontender and I wonder if this is all just venous inflammation. 10/2; the patient has 2 open areas on the right posterior calf both of these look good we have been using silver alginate. He has traumatic wounds on the left and right mid tibia areas. The surface of these wounds is still not ready for closure. We have been using Iodoflex. Finally has areas on the left knee. The latter wounds are all traumatic after a fall 10/9. His original wounds of the right posterior calf are superficial and progressing towards closure. His traumatic wounds on the left anterior tibia and right anterior tibia are considerably improved in terms of the wound surface. He asked me about a skin graft in these areas I do not think this is going to be necessary. Change from Iodosorb to [facility]. ooThe area on the left knee is just about closed as well 10/16; his original wound on the right posterior calf very superficial looks healthy. He has more recent  traumatic wounds on the left anterior tibia and right anterior tibia both of these have better looking surfaces and measuring smaller. Might be beneficial for an advanced treatment product. The area on his left knee has a small superficial scab and we are going to close that when 10/23; his original wound areas on the right posterior calf continue to improve. The more recently traumatic wounds on the left anterior tibia and right anterior tibia both look better in terms of surfaces. No debridement required. We have been using Hydrofera Blue. He is approved for Apligraf 11/3; Apligraf #1 applied to the left anterior tibia right anterior tibia and to 2 small areas remaining on the right posterior calf which were part of his initial wound area. 11/19; Apligraf #2. Left anterior tibia is healed. Right anterior tibia much better. Only one small area remains on the right posterior calf which was part of his initial wound area 12/3; the left anterior tibia has opened up again. Hyper granulated nodule. Right anterior tibia is superficial and larger. There is no depth of this. I did not place Apligraf today return to [facility] under compression 12/10; the patient has 2 remaining wounds 1 on the  right posterior and the other on the left anterior. Both of these look quite good. No debridement is required we have been using Hydrofera Blue under compression. 12/17; the patient has really not closed over which is disappointing. His wraps were very tight and there may be some wrap injury issues. On the left he has the original wound on the medial mid tibia he has a wrap injury on the lateral mid tibia. On the right his posterior areas are superficial but certainly not closed 12/31; the patient has totally closed on the left. He will transition to his own 20/30 below-knee stocking on the left leg today. Interestingly on the right posterior calf the 2 wounds that he had from last time of closed however there  is an additional injury in this same area. Almost looks as though there is subcutaneous fluid/blistering. There is no evidence of cellulitis this does not seem tender 06/24/2019; the patient comes in having been placed in his 20/30 stockings on the left leg last week. He states that he noted blisters break open on Saturday and they had opened into wounds on the anterior tibial area on the left by Monday. The area on the posterior right leg looks satisfactory we are still doing that and compression. We are using calcium alginate to all wound areas 1/14; the patient had juxta light however one of them was not the right size which will need to be returned. The new area last week on the left anterior tibia looks a lot better it measures smaller I would say the area on the right is about the same, this is on the right posterior calf. We have been using silver alginate 1/21; he has his bilateral juxta lites however the left anterior wound continues to look better where is the 2 areas on the right posterior calf look about the same. We have been using Hydrofera Blue under compression with the juxta lite stockings now on standby 2/4; the areas on the left are totally healed and he will be discharged into his own stocking/juxta light. ooOn the right posterior calf he has what looks to be a blister with a small area of subdermal hemorrhage this is very tiny but I did I am going to wrap him today and see if this will be reabsorbed by next week Objective Constitutional Vitals Time Taken: 2:45 PM, Height: 73 in, Weight: 290 lbs, BMI: 38.3, Temperature: 97.5 F, Pulse: 51 bpm, Respiratory Rate: 18 breaths/min, Blood Pressure: 146/88 mmHg. Integumentary (Hair, Skin) Wound #6 status is Open. Original cause of wound was Gradually Appeared. The wound is located on the Right,Proximal,Posterior Lower Leg. The wound measures 0.1cm length x 0.1cm width x 0.1cm depth; 0.008cm^2 area and 0.001cm^3 volume. There is no  tunneling or undermining noted. There is a none present amount of drainage noted. The wound margin is flat and intact. There is no granulation within the wound bed. There is no necrotic tissue within the wound bed. General Notes: blistered noted. Wound #8R status is Healed - Epithelialized. Original cause of wound was Gradually Appeared. The wound is located on the Left,Anterior Lower Leg. The wound measures 0cm length x 0cm width x 0cm depth; 0cm^2 area and 0cm^3 volume. There is no tunneling or undermining noted. There is a none present amount of drainage noted. The wound margin is flat and intact. There is no granulation within the wound bed. There is no necrotic tissue within the wound bed. Assessment Active Problems ICD-10 Non-pressure chronic ulcer of right calf  with fat layer exposed Type 2 diabetes mellitus with other skin ulcer Chronic venous hypertension (idiopathic) with inflammation of right lower extremity Laceration without foreign body, right lower leg, subsequent encounter Non-pressure chronic ulcer of other part of left lower leg with fat layer exposed Procedures Wound #6 Pre-procedure diagnosis of Wound #6 is a Venous Leg Ulcer located on the Right,Proximal,Posterior Lower Leg . There was a Three Layer Compression Therapy Procedure with a pre-treatment ABI of 0.9 by Baruch Gouty, RN. Post procedure Diagnosis Wound #6: Same as Pre-Procedure Plan Follow-up Appointments: Return Appointment in 1 week. Dressing Change Frequency: Do not change entire dressing for one week. Skin Barriers/Peri-Wound Care: Moisturizing lotion - both legs patient to lotion twice a day to left leg. Wound Cleansing: May shower with protection. Primary Wound Dressing: Wound #6 Right,Proximal,Posterior Lower Leg: Other: - gauze Secondary Dressing: Wound #6 Right,Proximal,Posterior Lower Leg: Dry Gauze Edema Control: 3 Layer Compression System - Right Lower Extremity Avoid standing for  long periods of time Elevate legs to the level of the heart or above for 30 minutes daily and/or when sitting, a frequency of: - 3-4 times a day. Exercise regularly Support Garment 30-40 mm/Hg pressure to: - patient to apply left leg juxtalite HD daily. apply in the morning and remove at night. 1. We allowed him to use his own stocking on the left leg. Suggested moisturization. This is a juxta lite stocking 2. Gauze and 3-layer wrap over the blistered area on the posterior calf. Electronic Signature(s) Signed: 07/21/2019 5:50:02 PM By: Linton Ham MD Entered By: Linton Ham on 07/21/2019 16:14:53 -------------------------------------------------------------------------------- SuperBill Details Patient Name: Date of Service: [name] 07/21/2019 Medical Record [id number] Patient Account Number: [id number] Date of Birth/Sex: Treating RN: 19-Jul-1955 (64 y.o. [name], Tammi Klippel Primary Care Provider: [name] Other Clinician: Referring Provider: Treating Provider/Extender:Louvenia Golomb, [name], [name] in Treatment: 51 Diagnosis Coding ICD-10 Codes Code Description [phone] Non-pressure chronic ulcer of right calf with fat layer exposed E11.622 Type 2 diabetes mellitus with other skin ulcer I87.321 Chronic venous hypertension (idiopathic) with inflammation of right lower extremity S81.811D Laceration without foreign body, right lower leg, subsequent encounter L97.822 Non-pressure chronic ulcer of other part of left lower leg with fat layer exposed Facility Procedures CPT4 Code Description: [id number] (Facility Use Only) [phone] - [place] RT LEG Modifier: Quantity: 1 Physician Procedures CPT4 Code Description: [id number] - WC PHYS LEVEL 3 - EST PT ICD-10 Diagnosis Description L97.212 Non-pressure chronic ulcer of right calf with fat layer L97.822 Non-pressure chronic ulcer of other part of left lower l Modifier: exposed eg with  fat laye Quantity: 1 r exposed Electronic Signature(s) Signed: 07/21/2019 5:50:02 PM By: Linton Ham MD Entered By: Linton Ham on 07/21/2019 16:15:28

## 2019-07-21 NOTE — Progress Notes (Addendum)
[name], [name] ([id number]) Visit Report for 07/21/2019 Arrival Information Details Patient Name: Date of Service: [name], [name] 07/21/2019 2:15 PM Medical Record [id number] Patient Account Number: [id number] Date of Birth/Sex: Treating RN: 1956-01-27 (64 y.o. [name] Primary Care [name]: [name] Other Clinician: Referring Lynsie Mcwatters: Treating Suheyla Mortellaro/Extender:Robson, [name], [name] in Treatment: 45 Visit Information History Since Last Visit Added or deleted any medications: No Patient Arrived: Ambulatory Any new allergies or adverse reactions: No Arrival Time: 15:00 Had a fall or experienced change in No Accompanied By: self activities of daily living that may affect Transfer Assistance: None risk of falls: Patient Identification Verified: Yes Signs or symptoms of abuse/neglect since last No Secondary Verification Process Yes visito Completed: Hospitalized since last visit: No Patient Requires Transmission-Based No Implantable device outside of the clinic excluding No Precautions: cellular tissue based products placed in the center Patient Has Alerts: Yes since last visit: Patient Alerts: Patient on Blood Has Dressing in Place as Prescribed: Yes Thinner Pain Present Now: No xarelto R TBI = .86, L TBI = .87 Electronic Signature(s) Signed: 07/21/2019 5:44:01 PM By: Kela Millin Entered By: Kela Millin on 07/21/2019 15:00:59 -------------------------------------------------------------------------------- Compression Therapy Details Patient Name: Date of Service: [name] 07/21/2019 2:15 PM Medical Record [id number] Patient Account Number: [id number] Date of Birth/Sex: Treating RN: 1955-11-12 (64 y.o. [name] Primary Care Eriyana Sweeten: [name] Other Clinician: Referring Zakhia Seres: Treating Maisen Schmit/Extender:Robson, [name], [name] in Treatment:  51 Compression Therapy Performed for Wound Wound #6 Right,Proximal,Posterior Lower Leg Assessment: Performed By: Clinician Baruch Gouty, RN Compression Type: Three Layer Pre Treatment ABI: 0.9 Post Procedure Diagnosis Same as Pre-procedure Electronic Signature(s) Signed: 07/21/2019 5:56:18 PM By: Deon Pilling Entered By: Deon Pilling on 07/21/2019 15:26:03 -------------------------------------------------------------------------------- Encounter Discharge Information Details Patient Name: Date of Service: [name] 07/21/2019 2:15 PM Medical Record [id number] Patient Account Number: [id number] Date of Birth/Sex: Treating RN: [birth year] (64 y.o. [name] Primary Care Khai Arrona: [name] Other Clinician: Referring Aleanna Menge: Treating Ky Moskowitz/Extender:Robson, [name], [name] in Treatment: 51 Encounter Discharge Information Items Discharge Condition: Stable Ambulatory Status: Ambulatory Discharge Destination: Home Transportation: Private Auto Accompanied By: self Schedule Follow-up Appointment: Yes Clinical Summary of Care: Patient Declined Electronic Signature(s) Signed: 07/21/2019 5:40:52 PM By: Baruch Gouty RN, BSN Entered By: Baruch Gouty on 07/21/2019 15:47:06 -------------------------------------------------------------------------------- Lower Extremity Assessment Details Patient Name: Date of Service: [name] 07/21/2019 2:15 PM Medical Record [id number] Patient Account Number: [id number] Date of Birth/Sex: Treating RN: [birth year] (64 y.o. [name] Primary Care Naylene Foell: [name] Other Clinician: Referring Ashritha Desrosiers: Treating Tarance Balan/Extender:Robson, [name], [name] in Treatment: 51 Edema Assessment Assessed: [Left: No] [Right: No] Edema: [Left: No] [Right: No] Calf Left: Right: Point of Measurement: 45 cm From Medial Instep 39.5 cm 41.5 cm Ankle Left:  Right: Point of Measurement: 13 cm From Medial Instep 23 cm 23.5 cm Vascular Assessment Pulses: Dorsalis Pedis Palpable: [Left:Yes] [Right:Yes] Electronic Signature(s) Signed: 07/21/2019 5:44:01 PM By: Kela Millin Entered By: Kela Millin on 07/21/2019 15:01:47 -------------------------------------------------------------------------------- Multi Wound Chart Details Patient Name: Date of Service: [name] 07/21/2019 2:15 PM Medical Record [id number] Patient Account Number: [id number] Date of Birth/Sex: Treating RN: [birth year] (64 y.o. M) Primary Care Koran Seabrook: [name] Other Clinician: Referring Usama Harkless: Treating Kya Mayfield/Extender:Robson, [name], [name] in Treatment: 51 Vital Signs Height(in): 73 Pulse(bpm): 51 Weight(lbs): 290 Blood Pressure(mmHg): 146/88 Body Mass Index(BMI): 38 Temperature(F): 97.5 Respiratory 18 Rate(breaths/min): Photos: [6:No Photos] [8R:No Photos] [N/A:N/A] Wound Location: [6:Right Lower Leg - Posterior,  Left, Anterior Lower Leg Proximal] [N/A:N/A] Wounding Event: [6:Gradually Appeared] [8R:Gradually Appeared] [N/A:N/A] Primary Etiology: [6:Venous Leg Ulcer] [8R:Venous Leg Ulcer] [N/A:N/A] Comorbid History: [6:Asthma, Sleep Apnea, Arrhythmia, Congestive Heart Failure, Hypertension, Heart Failure, Hypertension, Type II Diabetes] [8R:Asthma, Sleep Apnea, Arrhythmia, Congestive Type II Diabetes] [N/A:N/A] Date Acquired: [6:01/06/2019] [8R:02/14/2019] [N/A:N/A] Weeks of Treatment: [6:28] [8R:22] [N/A:N/A] Wound Status: [6:Open] [8R:Healed - Epithelialized] [N/A:N/A] Wound Recurrence: [6:No] [8R:Yes] [N/A:N/A] Clustered Wound: [6:Yes] [8R:No] [N/A:N/A] Clustered Quantity: [6:1] [8R:N/A] [N/A:N/A] Measurements L x W x D [6:0.1x0.1x0.1] [8R:0x0x0] [N/A:N/A] (cm) Area (cm) : [1:2.751] [8R:0] [N/A:N/A] Volume (cm) : [6:0.001] [8R:0] [N/A:N/A] % Reduction in Area: [6:98.00%] [8R:100.00%] [N/A:N/A] %  Reduction in Volume: [6:97.40%] [8R:100.00%] [N/A:N/A] Classification: [6:Full Thickness Without Exposed Support Structures Exposed Support Structures] [8R:Full Thickness Without] [N/A:N/A] Exudate Amount: [6:None Present] [8R:None Present] [N/A:N/A] Wound Margin: [6:Flat and Intact] [8R:Flat and Intact] [N/A:N/A] Granulation Amount: [6:None Present (0%)] [8R:None Present (0%)] [N/A:N/A] Necrotic Amount: [6:None Present (0%)] [8R:None Present (0%)] [N/A:N/A] Exposed Structures: [6:Fascia: No Fat Layer (Subcutaneous Fat Layer (Subcutaneous Tissue) Exposed: No Tendon: No Muscle: No Joint: No Bone: No] [8R:Fascia: No Tissue) Exposed: No Tendon: No Muscle: No Joint: No Bone: No] [N/A:N/A] Epithelialization: [6:Large (67-100%)] [8R:Small (1-33%)] [N/A:N/A] Assessment Notes: [6:blistered noted. Compression Therapy] [8R:N/A N/A] [N/A:N/A N/A] Treatment Notes Wound #6 (Right, Proximal, Posterior Lower Leg) 2. Periwound Care Moisturizing lotion 3. Primary Dressing Applied Dry Gauze 6. Support Layer Applied 3 layer compression Water quality scientist) Signed: 07/21/2019 5:50:02 PM By: Linton Ham MD Entered By: Linton Ham on 07/21/2019 16:12:01 -------------------------------------------------------------------------------- Multi-Disciplinary Care Plan Details Patient Name: Date of Service: [name] 07/21/2019 2:15 PM Medical Record [id number] Patient Account Number: [id number] Date of Birth/Sex: Treating RN: 1955-10-04 (64 y.o. [name] Primary Care Rollyn Scialdone: [name] Other Clinician: Referring Robby Bulkley: Treating Nazly Digilio/Extender:Robson, [name], [name] in Treatment: 51 Active Inactive Venous Leg Ulcer Nursing Diagnoses: Actual venous Insuffiency (use after diagnosis is confirmed) Knowledge deficit related to disease process and management Goals: Non-invasive venous studies are completed as ordered Date Initiated:  09/17/2018 Date Inactivated: 09/23/2018 Target Resolution Date: 09/23/2018 Goal Status: Met Patient will maintain optimal edema control Date Initiated: 09/17/2018 Target Resolution Date: 09/09/2019 Goal Status: Active Patient/caregiver will verbalize understanding of disease process and disease management Date Initiated: 09/17/2018 Date Inactivated: 09/23/2018 Target Resolution Date: 10/15/2018 Goal Status: Met Interventions: Assess peripheral edema status every visit. Compression as ordered Provide education on venous insufficiency Treatment Activities: Therapeutic compression applied : 09/17/2018 Notes: Electronic Signature(s) Signed: 07/21/2019 5:56:18 PM By: Deon Pilling Entered By: Deon Pilling on 07/21/2019 15:54:47 -------------------------------------------------------------------------------- Pain Assessment Details Patient Name: Date of Service: [name], [name] 07/21/2019 2:15 PM Medical Record [id number] Patient Account Number: [id number] Date of Birth/Sex: Treating RN: 20-Mar-1956 (64 y.o. [name] Primary Care Tanesia Butner: [name] Other Clinician: Referring Carmen Tolliver: Treating Khaleb Broz/Extender:Robson, [name], [name] in Treatment: 51 Active Problems Location of Pain Severity and Description of Pain Patient Has Paino No Site Locations Pain Management and Medication Current Pain Management: Electronic Signature(s) Signed: 07/21/2019 5:44:01 PM By: Kela Millin Entered By: Kela Millin on 07/21/2019 15:01:37 -------------------------------------------------------------------------------- Patient/Caregiver Education Details Patient Name: Date of Service: [name] 2/4/2021andnbsp2:15 PM Medical Record [id number] Patient Account Number: [id number] Date of Birth/Gender: Treating RN: 07-Oct-1955 (63 y.o. [name] Primary Care Physician: [name] Other Clinician: Referring Physician: Treating  Physician/Extender:Robson, [name], [name] in Treatment: 12 Education Assessment Education Provided To: Patient Education Topics Provided Wound/Skin Impairment: Handouts: Skin Care Do's and Dont's Methods: Explain/Verbal Responses: Reinforcements needed Electronic  Signature(s) Signed: 07/21/2019 5:56:18 PM By: Deon Pilling Entered By: Deon Pilling on 07/21/2019 15:20:23 -------------------------------------------------------------------------------- Wound Assessment Details Patient Name: Date of Service: [name], [name] 07/21/2019 2:15 PM Medical Record [id number] Patient Account Number: [id number] Date of Birth/Sex: Treating RN: 02-19-1956 (64 y.o. M) Primary Care Catalena Stanhope: [name] Other Clinician: Referring Kamdyn Colborn: Treating Brenda Cowher/Extender:Robson, [name], [name] in Treatment: 51 Wound Status Wound Number: 6 Primary Venous Leg Ulcer Etiology: Wound Location: Right Lower Leg - Posterior, Proximal Wound Open Status: Wounding Event: Gradually Appeared Comorbid Asthma, Sleep Apnea, Arrhythmia, Congestive Date Acquired: 01/06/2019 History: Heart Failure, Hypertension, Type II Diabetes Weeks Of Treatment: 28 Clustered Wound: Yes Photos Wound Measurements Length: (cm) 0.1 % Reduct Width: (cm) 0.1 % Reduct Depth: (cm) 0.1 Epitheli Clustered Quantity: 1 Tunnelin Area: (cm) 0.008 Undermi Volume: (cm) 0.001 Wound Description Classification: Full Thickness Without Exposed Support Foul Odo Structures Slough/F Wound Flat and Intact Margin: Exudate None Present Amount: Wound Bed Granulation Amount: None Present (0%) Necrotic Amount: None Present (0%) Fascia E Fat Laye Tendon E Muscle E Joint Ex Bone Exp Assessment Notes blistered noted. Treatment Notes Wound #6 (Right, Proximal, Posterior Lower Leg) 2. Periwound Care Moisturizing lotion 3. Primary Dressing Applied Dry Gauze 6. Support Layer Applied 3  layer compression wrap r After Cleansing: No ibrino No Exposed Structure xposed: No r (Subcutaneous Tissue) Exposed: No xposed: No xposed: No posed: No osed: No ion in Area: 98% ion in Volume: 97.4% alization: Large (67-100%) g: No ning: No Electronic Signature(s) Signed: 07/22/2019 4:37:46 PM By: Mikeal Hawthorne EMT/HBOT Previous Signature: 07/21/2019 5:56:18 PM Version By: Deon Pilling Entered By: Mikeal Hawthorne on 07/22/2019 15:47:47 -------------------------------------------------------------------------------- Wound Assessment Details Patient Name: Date of Service: [name] 07/21/2019 2:15 PM Medical Record [id number] Patient Account Number: [id number] Date of Birth/Sex: Treating RN: [birth year] (65 y.o. [name], [name] Primary Care Xavien Dauphinais: [name] Other Clinician: Referring Chianne Byrns: Treating Ra Pfiester/Extender:Robson, [name], [name] in Treatment: 51 Wound Status Wound Number: [id number] Primary Venous Leg Ulcer Etiology: Wound Location: Left Lower Leg - Anterior Wound Healed - Epithelialized Wounding Event: Gradually Appeared Status: Date Acquired: 02/14/2019 Comorbid Asthma, Sleep Apnea, Arrhythmia, Congestive Weeks Of Treatment: 22 History: Heart Failure, Hypertension, Type II Diabetes Clustered Wound: No Photos Wound Measurements Length: (cm) 0 % Reducti Width: (cm) 0 % Reducti Depth: (cm) 0 Epithelia Area: (cm) 0 Tunnelin Volume: (cm) 0 Undermin Wound Description Classification: Full Thickness Without Exposed Support Foul Odo Structures Slough/F Wound Flat and Intact Margin: Exudate None Present Amount: Wound Bed Granulation Amount: None Present (0%) Necrotic Amount: None Present (0%) Fascia E Fat Laye Tendon E Muscle E Joint Ex Bone Exp r After Cleansing: No ibrino No Exposed Structure xposed: No r (Subcutaneous Tissue) Exposed: No xposed: No xposed: No posed: No osed: No on in Area: 100% on in  Volume: 100% lization: Small (1-33%) g: No ing: No Electronic Signature(s) Signed: 07/22/2019 4:37:46 PM By: Mikeal Hawthorne EMT/HBOT Signed: 07/22/2019 5:54:34 PM By: Deon Pilling Previous Signature: 07/21/2019 5:56:18 PM Version By: Deon Pilling Entered By: Mikeal Hawthorne on 07/22/2019 15:48:15 -------------------------------------------------------------------------------- Vitals Details Patient Name: Date of Service: [name] 07/21/2019 2:15 PM Medical Record [id number] Patient Account Number: [id number] Date of Birth/Sex: Treating RN: 07-Nov-1955 (64 y.o. [name] Primary Care Kurk Corniel: [name] Other Clinician: Referring Graciano Batson: Treating Molleigh Huot/Extender:Robson, [name], [name] in Treatment: 51 Vital Signs Time Taken: 14:45 Temperature (F): 97.5 Height (in): 73 Pulse (bpm): 51 Weight (lbs): 290 Respiratory Rate (breaths/min): 18 Body Mass Index (BMI): 38.3  Blood Pressure (mmHg): 146/88 Reference Range: 80 - 120 mg / dl Electronic Signature(s) Signed: 07/21/2019 5:44:01 PM By: Kela Millin Entered By: Kela Millin on 07/21/2019 15:01:30

## 2019-07-28 ENCOUNTER — Encounter (HOSPITAL_BASED_OUTPATIENT_CLINIC_OR_DEPARTMENT_OTHER): Payer: BC Managed Care – PPO | Admitting: Internal Medicine

## 2019-07-28 ENCOUNTER — Other Ambulatory Visit: Payer: Self-pay

## 2019-07-28 DIAGNOSIS — I87313 Chronic venous hypertension (idiopathic) with ulcer of bilateral lower extremity: Secondary | ICD-10-CM | POA: Diagnosis not present

## 2019-07-28 DIAGNOSIS — E875 Hyperkalemia: Secondary | ICD-10-CM | POA: Diagnosis not present

## 2019-07-28 DIAGNOSIS — I87311 Chronic venous hypertension (idiopathic) with ulcer of right lower extremity: Secondary | ICD-10-CM | POA: Diagnosis not present

## 2019-07-28 DIAGNOSIS — E11622 Type 2 diabetes mellitus with other skin ulcer: Secondary | ICD-10-CM | POA: Diagnosis not present

## 2019-07-28 DIAGNOSIS — I4891 Unspecified atrial fibrillation: Secondary | ICD-10-CM | POA: Diagnosis not present

## 2019-07-28 DIAGNOSIS — Z8616 Personal history of COVID-19: Secondary | ICD-10-CM | POA: Diagnosis not present

## 2019-07-28 DIAGNOSIS — I110 Hypertensive heart disease with heart failure: Secondary | ICD-10-CM | POA: Diagnosis not present

## 2019-07-28 DIAGNOSIS — I5022 Chronic systolic (congestive) heart failure: Secondary | ICD-10-CM | POA: Diagnosis not present

## 2019-07-28 DIAGNOSIS — I872 Venous insufficiency (chronic) (peripheral): Secondary | ICD-10-CM | POA: Diagnosis not present

## 2019-07-28 DIAGNOSIS — I429 Cardiomyopathy, unspecified: Secondary | ICD-10-CM | POA: Diagnosis not present

## 2019-07-28 DIAGNOSIS — L97212 Non-pressure chronic ulcer of right calf with fat layer exposed: Secondary | ICD-10-CM | POA: Diagnosis not present

## 2019-07-28 DIAGNOSIS — G4730 Sleep apnea, unspecified: Secondary | ICD-10-CM | POA: Diagnosis not present

## 2019-07-28 DIAGNOSIS — L97822 Non-pressure chronic ulcer of other part of left lower leg with fat layer exposed: Secondary | ICD-10-CM | POA: Diagnosis not present

## 2019-07-28 DIAGNOSIS — Z7901 Long term (current) use of anticoagulants: Secondary | ICD-10-CM | POA: Diagnosis not present

## 2019-07-28 DIAGNOSIS — I890 Lymphedema, not elsewhere classified: Secondary | ICD-10-CM | POA: Diagnosis not present

## 2019-07-28 DIAGNOSIS — J45909 Unspecified asthma, uncomplicated: Secondary | ICD-10-CM | POA: Diagnosis not present

## 2019-07-29 NOTE — Progress Notes (Signed)
EDYN, NORDELL (OP:[id number]) Visit Report for 07/28/2019 HPI Details Patient Name: Date of Service: [name], [name] 07/28/2019 1:45 PM Medical Record [id number] Patient Account Number: [id number] Date of Birth/Sex: Treating RN: 1956/01/02 (64 y.o. [name] Primary Care Provider: [name] Other Clinician: Referring Provider: Treating Provider/Extender:Roxanne Orner, [name], [name] in Treatment: 52 History of Present Illness HPI Description: ADMISSION 07/29/2018 [name] is a 64 year old man with either prediabetes or diabetes he is on glipizide. In late October to November 2019 he noted a scabbed area on the back of his right calf. He picked this off a few times but it would not heal. He saw his primary physician on 12/5. It was felt at that time that this may actually heal on its own with conservative management. The next visit was on 07/20/2018 noted the area was a lot worse and arranged for his treatment here. He has been topical antibiotics like Neosporin although he stopped using this when the wound looked worse. He is just been covering this with a clean Band-Aid. He is given Bactrim a week ago and he is finishing this currently. It is made some improvement in the surrounding erythema per the patient. He does not have a history of nonhealing wounds. No prior history of wounds on his legs that he had difficulty healing. No prior skin issues. 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 Past medical history; A. fib status post ablation and recent cardioversion he is on Xarelto. He has a history of systolic heart failure, cervical radiculopathy, cardiomyopathy and type 2 diabetes as discussed ABI in the right leg was noncompressible on the right 2/20; the biopsy I did on the patient last week was negative for malignancy. Culture grew Pseudomonas and methicillin sensitive staph aureus. He is on cefdinir 300 twice a day. I will have to make  this a 10 day course. He put him in compression. He states that the redness pain and erythema are a lot better. I suspect the patient has chronic venous insufficiency probably with a secondary cellulitis 2/27; arrives today with copious amounts of drainage coming out of the wound irritating the skin below the wound area. He only renewed the final 3 days of cefdinir today 3/5; came in for a nurse change 3 days ago. Again a lot of drainage noted. Zinc oxide was applied unfortunately the drainage appears to a pool then he has a string of superficial open areas extending down into the Achilles area and some just below the wound. The wound itself does not look too bad. He is completed the antibiotics although apparently there was separation from the original 7 with the last 3 days. Culture grew a few MSSA and a few Pseudomonas 3/12; not too much difference over the last week. He came in for a dressing change on Monday by our nursing staff. Necrotic debris again over the wound surface. The entire area looks irritated but nontender and I do not think shows obvious evidence of infection. We have not heard anything about the reflux studies. Also notable than in this diabetic man we had noncompressible vessels and although I can feel his pulses easily in his feet I will order arterial studies as well. 3/19-Patient had experience more pain has been dressed with a silver alginate, the wounds all have necrotic debris, very friable with easy bleeding with any kind of surface debridement including with gauze and Anasept and with a #3 curette. His vascular appointments unfortunately were all canceled on  account of the virus outbreak resulting in studies only being done for emergent cases. His pulses are easily palpable in the lower extremity. He has open areas below the primary wound where he states the drainage which included purulent material also with some blood pooled and caused breakdown. 3/26; the patient  was changed to Santyl with [facility] backing last week. This was largely because the silver alginate was sticking to the wound. He is still having quite a bit of pain. He tells [place] that the reflux studies and arterial studies we had attempted to arrange through vein and vascular are not being booked until mid May. Culture that was done last week showed both Serratia moderate and a few methicillin sensitive staph aureus I started him on Bactrim DS 1 p.o. twice daily on 3/23 for 7 days. He is here for follow-up. 4/3; patient is on Santyl with Hydrofera Blue. Patient complains of pain. Our intake nurse is noted drainage and some odor. He has completed Bactrim recently for Serratia and a few methicillin sensitive staph aureus. After our staff push hard last week we were able to get venous studies as well as arterial studies. His arterial studies showed on the right great toe pressure of 0.86. On the left ABI at 1.47 TBI of 0.87. On both sides waveforms were triphasic VENOUS STUDIES showed reflux in the femoral vein, popliteal vein great saphenous vein at the saphenofemoral junction and great saphenous vein at the proximal thigh. Also noted to have age-indeterminate superficial vein thrombosis involving the small saphenous vein. Notable that the patient is already on Xarelto for atrial fibrillation. 4/9; the patient has been to see vascular surgery and reviewed by Dr. [name]. He was felt to have only minimal superficial venous reflux in the right great greater saphenous vein. For this reason he was not felt to be a candidate for laser ablation of the right greater saphenous vein. A wedge cushion was suggested to keep his leg elevated. He does have deep venous reflux on the right. Culture I did last time showed a combination of Serratia Pseudomonas and methicillin sensitive staph aureus. We have been using Hydrofera Blue to the large wound, Santyl to some of the deep punched out satellite lesions  distal to it. There is some improvement [id number]; the application for Apligraf was put out for further review for material that we have resubmitted. He has the deep area on the right posterior calf which is large and then 3 small punched out areas beneath this. In general his wounds look a lot better 4/23; the patient has his large original wound and then 2 punched out satellite lesions below it on the right posterior calf. I was usually able to use Apligraf #1 to cover the full surface area of the larger wound. We continued with Santyl and Hydrofera Blue on the 2 punched-out areas 5/7; patient has his large original wound and 2 punched out satellite lesions below it in the right posterior calf. Apligraf #2 today. Major improvement in the big wound in terms of wound depth. Punched-out [place] wounds have a very healthy looking wound bed. 5/21-Patient comes back for his large right posterior calf wound for which she has been an Apligraf #3 today. Wound depth seems to be better, the punched-out wounds appear to have healthy bed with some bleeding 6/4; right posterior calf wound is much better. Apligraf #4 today. The major wound has no wound depth at all. Only 1 of the satellite lesions is still open. The patient  has very high blood pressure coming in today with a diastolic blood pressure of 130 after lying there for a few minutes it was down to 110. He states that is running [id number] diastolic at home. He was high last week as well I thought he was on 50 mg 1/2 tablet twice daily of losartan I told him to double up on that however it turns out he is actually on 50 twice daily. Course he is run out of his medications prematurely. He has a follow-up with his primary's office next Wednesday. 6/18; patient's wounds continue to improve. The small area laterally is just about closed and there is continued epithelialization on the area on the posterior calf. 7/2; we applied his last Apligraf last visit. Early  the next week there was a lot of purulent looking drainage that I cultured this grew staph and Pseudomonas however when we had him back for the next visit to 3 days later everything looked a lot better. He did not receive systemic antibiotics. Fortunately we did not have to remove the Apligraf. He has had heart trouble this week he was having an ablation apparently they have discovered a clot in his left atrium they have changed all his anticoagulants as well as his blood pressure medication. 7/16; using Hydrofera Blue. We are making nice progress towards closure. He has not yet ordered his compression stockings he has his measurements 7/23; dimensions are better. He has a new satellite area superiorly. Both wounds cauterized with silver nitrate. 7/30; absolutely no change in the major wound on the posterior part of the right calf. We have been using Hydrofera Blue for a prolonged period of time and really had a nice improvement but over the last few weeks this is stalled. There is no depth. He arrived in clinic today with a new area on the right anterior tibia which apparently was an "ingrown hair". It is clearly an open area. This looks like a wrap injury although he was not really aware of it. 8/31; since the patient was last in clinic he was admitted to hospital from 8/17 through 8/22. He had had multiple falls at home including one off a ladder. He was admitted to hospital with mild COVID-19 viral pneumonia. Noted to be hyperkalemic and in acute renal failure. He has chronic systolic heart failure with ejection fraction of 35%. He arrives back in clinic with the original wound on the posterior aspect of the right lower calf looking a lot better. He has eschared areas from falls and lacerations on his anterior knees bilaterally just below the patella and on the mid part of his tibia bilaterally. He seems to have a small area on the right lateral ankle as well 9/10- Patient comes to clinic  with a 2 new wounds one on the left anterior shin and one on the right posterior leg both the left knee and right knee areas are healed up. We have been using Santyl to the left anterior leg and silver alginate to right posterior leg wounds. 9/17; the patient is original wound looks good on the posterior right calf. He has traumatic areas on the left anterior shin left anterior patellar tendon and the right mid tibia area. Except for the right posterior calf all required debridement. We have been using Santyl on these areas and silver alginate posteriorly right calf 9/24; the original wound on the posterior calf on the right looks good. This is healthy. There are traumatic wounds in the left anterior shin,  left anterior patella in the right mid tibia area are about the same. He has dusky erythema on the left anterior tibia which led me to give him antibiotics last week. This does not look too much different. This is nontender and I wonder if this is all just venous inflammation. 10/2; the patient has 2 open areas on the right posterior calf both of these look good we have been using silver alginate. He has traumatic wounds on the left and right mid tibia areas. The surface of these wounds is still not ready for closure. We have been using Iodoflex. Finally has areas on the left knee. The latter wounds are all traumatic after a fall 10/9. His original wounds of the right posterior calf are superficial and progressing towards closure. His traumatic wounds on the left anterior tibia and right anterior tibia are considerably improved in terms of the wound surface. He asked me about a skin graft in these areas I do not think this is going to be necessary. Change from Iodosorb to [facility]. The area on the left knee is just about closed as well 10/16; his original wound on the right posterior calf very superficial looks healthy. He has more recent traumatic wounds on the left anterior tibia and  right anterior tibia both of these have better looking surfaces and measuring smaller. Might be beneficial for an advanced treatment product. The area on his left knee has a small superficial scab and we are going to close that when 10/23; his original wound areas on the right posterior calf continue to improve. The more recently traumatic wounds on the left anterior tibia and right anterior tibia both look better in terms of surfaces. No debridement required. We have been using Hydrofera Blue. He is approved for Apligraf 11/3; Apligraf #1 applied to the left anterior tibia right anterior tibia and to 2 small areas remaining on the right posterior calf which were part of his initial wound area. 11/19; Apligraf #2. Left anterior tibia is healed. Right anterior tibia much better. Only one small area remains on the right posterior calf which was part of his initial wound area 12/3; the left anterior tibia has opened up again. Hyper granulated nodule. Right anterior tibia is superficial and larger. There is no depth of this. I did not place Apligraf today return to [facility] under compression 12/10; the patient has 2 remaining wounds 1 on the right posterior and the other on the left anterior. Both of these look quite good. No debridement is required we have been using Hydrofera Blue under compression. 12/17; the patient has really not closed over which is disappointing. His wraps were very tight and there may be some wrap injury issues. On the left he has the original wound on the medial mid tibia he has a wrap injury on the lateral mid tibia. On the right his posterior areas are superficial but certainly not closed 12/31; the patient has totally closed on the left. He will transition to his own 20/30 below-knee stocking on the left leg today. Interestingly on the right posterior calf the 2 wounds that he had from last time of closed however there is an additional injury in this same area.  Almost looks as though there is subcutaneous fluid/blistering. There is no evidence of cellulitis this does not seem tender 06/24/2019; the patient comes in having been placed in his 20/30 stockings on the left leg last week. He states that he noted blisters break open on Saturday and  they had opened into wounds on the anterior tibial area on the left by Monday. The area on the posterior right leg looks satisfactory we are still doing that and compression. We are using calcium alginate to all wound areas 1/14; the patient had juxta light however one of them was not the right size which will need to be returned. The new area last week on the left anterior tibia looks a lot better it measures smaller I would say the area on the right is about the same, this is on the right posterior calf. We have been using silver alginate 1/21; he has his bilateral juxta lites however the left anterior wound continues to look better where is the 2 areas on the right posterior calf look about the same. We have been using Hydrofera Blue under compression with the juxta lite stockings now on standby 2/4; the areas on the left are totally healed and he will be discharged into his own stocking/juxta light. On the right posterior calf he has what looks to be a blister with a small area of subdermal hemorrhage this is very tiny but I did I am going to wrap him today and see if this will be reabsorbed by next week 2/11; he comes in this week with expanded wounds on the posterior calf as well as a new wound on the left anterior. He said the left anterior started a few days ago with a blister that is now surface. He is using his juxta lite stockings at a pressure of 30/40 Electronic Signature(s) Signed: 07/29/2019 1:06:39 PM By: Linton Ham MD Entered By: Linton Ham on 07/28/2019 14:32:13 -------------------------------------------------------------------------------- Physical Exam Details Patient Name: Date of  Service: [name] 07/28/2019 1:45 PM Medical Record [id number] Patient Account Number: [id number] Date of Birth/Sex: Treating RN: 1955-08-10 (64 y.o. [name] Primary Care Provider: [name] Other Clinician: Referring Provider: Treating Provider/Extender:Kayanna Mckillop, [name], [name] in Treatment: 77 Constitutional Patient is hypertensive.. Pulse regular and within target range for patient.[name] Respirations regular, non-labored and within target range.. Temperature is normal and within the target range for the patient.[name] Appears in no distress. Notes Wound examstill an area on the right posterior calf that was a blister last week and has reopened. There was another small wound so on the right he has 2 small superficial areas posteriorly. On the left he has a sizable linear de-epithelialized area. These apparently started as blisters but he does not have a lot of evidence of edema. Most of this is already started to epithelialize Electronic Signature(s) Signed: 07/29/2019 1:06:39 PM By: Linton Ham MD Entered By: Linton Ham on 07/28/2019 14:35:18 -------------------------------------------------------------------------------- Physician Orders Details Patient Name: Date of Service: [name] 07/28/2019 1:45 PM Medical Record [id number] Patient Account Number: [id number] Date of Birth/Sex: Treating RN: Jul 22, 1955 (64 y.o. [name], Tammi Klippel Primary Care Provider: [name] Other Clinician: Referring Provider: Treating Provider/Extender:Espen Bethel, [name], [name] in Treatment: 69 Verbal / Phone Orders: No Diagnosis Coding ICD-10 Coding Code Description [phone] Non-pressure chronic ulcer of right calf with fat layer exposed E11.622 Type 2 diabetes mellitus with other skin ulcer I87.321 Chronic venous hypertension (idiopathic) with inflammation of right lower extremity S81.811D Laceration without  foreign body, right lower leg, subsequent encounter L97.822 Non-pressure chronic ulcer of other part of left lower leg with fat layer exposed Follow-up Appointments Return Appointment in 1 week. Dressing Change Frequency Do not change entire dressing for one week. Skin Barriers/Peri-Wound Care Moisturizing lotion - both legs patient to  lotion twice a day to left leg. Wound Cleansing May shower with protection. Primary Wound Dressing Wound #6 Right,Proximal,Posterior Lower Leg Polymem Wound #8RR Left,Anterior Lower Leg Polymem Secondary Dressing Wound #6 Right,Proximal,Posterior Lower Leg Dry Gauze Wound #8RR Left,Anterior Lower Leg Dry Gauze Edema Control 3 Layer Compression System - Bilateral Avoid standing for long periods of time Elevate legs to the level of the heart or above for 30 minutes daily and/or when sitting, a frequency of: - 3-4 times a day. Exercise regularly Electronic Signature(s) Signed: 07/28/2019 5:57:11 PM By: Deon Pilling Signed: 07/29/2019 1:06:39 PM By: Linton Ham MD Entered By: Deon Pilling on 07/28/2019 14:24:31 -------------------------------------------------------------------------------- Problem List Details Patient Name: Date of Service: [name] 07/28/2019 1:45 PM Medical Record [id number] Patient Account Number: [id number] Date of Birth/Sex: Treating RN: [birth year] (64 y.o. [name], [name] Primary Care Provider: [name] Other Clinician: Referring Provider: Treating Provider/Extender:Isreal Moline, [name], [name] in Treatment: 52 Active Problems ICD-10 Evaluated Encounter Evaluated Encounter Code Description Active Date Today Diagnosis L97.212 Non-pressure chronic ulcer of right calf with fat layer 07/29/2018 No Yes exposed E11.622 Type 2 diabetes mellitus with other skin ulcer 07/29/2018 No Yes I87.321 Chronic venous hypertension (idiopathic) with 07/29/2018 No Yes inflammation of right lower  extremity S81.811D Laceration without foreign body, right lower leg, 02/14/2019 No Yes subsequent encounter L97.822 Non-pressure chronic ulcer of other part of left lower 06/30/2019 No Yes leg with fat layer exposed Inactive Problems ICD-10 Code Description Active Date Inactive Date S81.812D Laceration without foreign body, left lower leg, subsequent 02/14/2019 02/14/2019 encounter L03.115 Cellulitis of right lower limb 09/24/2018 09/24/2018 Resolved Problems Electronic Signature(s) Signed: 07/29/2019 1:06:39 PM By: Linton Ham MD Entered By: Linton Ham on 07/28/2019 14:29:42 -------------------------------------------------------------------------------- Progress Note Details Patient Name: Date of Service: [name] 07/28/2019 1:45 PM Medical Record [id number] Patient Account Number: [id number] Date of Birth/Sex: Treating RN: 1955-07-15 (64 y.o. [name] Primary Care Provider: [name] Other Clinician: Referring Provider: Treating Provider/Extender:Katha Kuehne, [name], [name] in Treatment: 52 Subjective History of Present Illness (HPI) ADMISSION 07/29/2018 Mr. Sobin is a 64 year old man with either prediabetes or diabetes he is on glipizide. In late October to November 2019 he noted a scabbed area on the back of his right calf. He picked this off a few times but it would not heal. He saw his primary physician on 12/5. It was felt at that time that this may actually heal on its own with conservative management. The next visit was on 07/20/2018 noted the area was a lot worse and arranged for his treatment here. He has been topical antibiotics like Neosporin although he stopped using this when the wound looked worse. He is just been covering this with a clean Band-Aid. He is given Bactrim a week ago and he is finishing this currently. It is made some improvement in the surrounding erythema per the patient. He does not have a history of  nonhealing wounds. No prior history of wounds on his legs that he had difficulty healing. No prior skin issues. 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 Past medical history; A. fib status post ablation and recent cardioversion he is on Xarelto. He has a history of systolic heart failure, cervical radiculopathy, cardiomyopathy and type 2 diabetes as discussed ABI in the right leg was noncompressible on the right 2/20; the biopsy I did on the patient last week was negative for malignancy. Culture grew Pseudomonas and methicillin sensitive staph aureus. He is on cefdinir 300  twice a day. I will have to make this a 10 day course. He put him in compression. He states that the redness pain and erythema are a lot better. I suspect the patient has chronic venous insufficiency probably with a secondary cellulitis 2/27; arrives today with copious amounts of drainage coming out of the wound irritating the skin below the wound area. He only renewed the final 3 days of cefdinir today 3/5; came in for a nurse change 3 days ago. Again a lot of drainage noted. Zinc oxide was applied unfortunately the drainage appears to a pool then he has a string of superficial open areas extending down into the Achilles area and some just below the wound. The wound itself does not look too bad. He is completed the antibiotics although apparently there was separation from the original 7 with the last 3 days. Culture grew a few MSSA and a few Pseudomonas 3/12; not too much difference over the last week. He came in for a dressing change on Monday by our nursing staff. Necrotic debris again over the wound surface. The entire area looks irritated but nontender and I do not think shows obvious evidence of infection. We have not heard anything about the reflux studies. Also notable than in this diabetic man we had noncompressible vessels and although I can feel his pulses easily in his feet I will order arterial  studies as well. 3/19-Patient had experience more pain has been dressed with a silver alginate, the wounds all have necrotic debris, very friable with easy bleeding with any kind of surface debridement including with gauze and Anasept and with a #3 curette. His vascular appointments unfortunately were all canceled on account of the virus outbreak resulting in studies only being done for emergent cases. His pulses are easily palpable in the lower extremity. He has open areas below the primary wound where he states the drainage which included purulent material also with some blood pooled and caused breakdown. 3/26; the patient was changed to Santyl with [facility] backing last week. This was largely because the silver alginate was sticking to the wound. He is still having quite a bit of pain. He tells [place] that the reflux studies and arterial studies we had attempted to arrange through vein and vascular are not being booked until mid May. Culture that was done last week showed both Serratia moderate and a few methicillin sensitive staph aureus I started him on Bactrim DS 1 p.o. twice daily on 3/23 for 7 days. He is here for follow-up. 4/3; patient is on Santyl with Hydrofera Blue. Patient complains of pain. Our intake nurse is noted drainage and some odor. He has completed Bactrim recently for Serratia and a few methicillin sensitive staph aureus. After our staff push hard last week we were able to get venous studies as well as arterial studies. His arterial studies showed on the right great toe pressure of 0.86. On the left ABI at 1.47 TBI of 0.87. On both sides waveforms were triphasic VENOUS STUDIES showed reflux in the femoral vein, popliteal vein great saphenous vein at the saphenofemoral junction and great saphenous vein at the proximal thigh. Also noted to have age-indeterminate superficial vein thrombosis involving the small saphenous vein. Notable that the patient is already on Xarelto  for atrial fibrillation. 4/9; the patient has been to see vascular surgery and reviewed by Dr. [name]. He was felt to have only minimal superficial venous reflux in the right great greater saphenous vein. For this reason he  was not felt to be a candidate for laser ablation of the right greater saphenous vein. A wedge cushion was suggested to keep his leg elevated. He does have deep venous reflux on the right. Culture I did last time showed a combination of Serratia Pseudomonas and methicillin sensitive staph aureus. We have been using Hydrofera Blue to the large wound, Santyl to some of the deep punched out satellite lesions distal to it. There is some improvement [id number]; the application for Apligraf was put out for further review for material that we have resubmitted. He has the deep area on the right posterior calf which is large and then 3 small punched out areas beneath this. In general his wounds look a lot better 4/23; the patient has his large original wound and then 2 punched out satellite lesions below it on the right posterior calf. I was usually able to use Apligraf #1 to cover the full surface area of the larger wound. We continued with Santyl and Hydrofera Blue on the 2 punched-out areas 5/7; patient has his large original wound and 2 punched out satellite lesions below it in the right posterior calf. Apligraf #2 today. Major improvement in the big wound in terms of wound depth. Punched-out [place] wounds have a very healthy looking wound bed. 5/21-Patient comes back for his large right posterior calf wound for which she has been an Apligraf #3 today. Wound depth seems to be better, the punched-out wounds appear to have healthy bed with some bleeding 6/4; right posterior calf wound is much better. Apligraf #4 today. The major wound has no wound depth at all. Only 1 of the satellite lesions is still open. The patient has very high blood pressure coming in today with a diastolic blood  pressure of 130 after lying there for a few minutes it was down to 110. He states that is running [id number] diastolic at home. He was high last week as well I thought he was on 50 mg 1/2 tablet twice daily of losartan I told him to double up on that however it turns out he is actually on 50 twice daily. Course he is run out of his medications prematurely. He has a follow-up with his primary's office next Wednesday. 6/18; patient's wounds continue to improve. The small area laterally is just about closed and there is continued epithelialization on the area on the posterior calf. 7/2; we applied his last Apligraf last visit. Early the next week there was a lot of purulent looking drainage that I cultured this grew staph and Pseudomonas however when we had him back for the next visit to 3 days later everything looked a lot better. He did not receive systemic antibiotics. Fortunately we did not have to remove the Apligraf. He has had heart trouble this week he was having an ablation apparently they have discovered a clot in his left atrium they have changed all his anticoagulants as well as his blood pressure medication. 7/16; using Hydrofera Blue. We are making nice progress towards closure. He has not yet ordered his compression stockings he has his measurements 7/23; dimensions are better. He has a new satellite area superiorly. Both wounds cauterized with silver nitrate. 7/30; absolutely no change in the major wound on the posterior part of the right calf. We have been using Hydrofera Blue for a prolonged period of time and really had a nice improvement but over the last few weeks this is stalled. There is no depth. He arrived in clinic today with  a new area on the right anterior tibia which apparently was an "ingrown hair". It is clearly an open area. This looks like a wrap injury although he was not really aware of it. 8/31; since the patient was last in clinic he was admitted to hospital from  8/17 through 8/22. He had had multiple falls at home including one off a ladder. He was admitted to hospital with mild COVID-19 viral pneumonia. Noted to be hyperkalemic and in acute renal failure. He has chronic systolic heart failure with ejection fraction of 35%. He arrives back in clinic with the original wound on the posterior aspect of the right lower calf looking a lot better. He has eschared areas from falls and lacerations on his anterior knees bilaterally just below the patella and on the mid part of his tibia bilaterally. He seems to have a small area on the right lateral ankle as well 9/10- Patient comes to clinic with a 2 new wounds one on the left anterior shin and one on the right posterior leg both the left knee and right knee areas are healed up. We have been using Santyl to the left anterior leg and silver alginate to right posterior leg wounds. 9/17; the patient is original wound looks good on the posterior right calf. He has traumatic areas on the left anterior shin left anterior patellar tendon and the right mid tibia area. Except for the right posterior calf all required debridement. We have been using Santyl on these areas and silver alginate posteriorly right calf 9/24; the original wound on the posterior calf on the right looks good. This is healthy. There are traumatic wounds in the left anterior shin, left anterior patella in the right mid tibia area are about the same. He has dusky erythema on the left anterior tibia which led me to give him antibiotics last week. This does not look too much different. This is nontender and I wonder if this is all just venous inflammation. 10/2; the patient has 2 open areas on the right posterior calf both of these look good we have been using silver alginate. He has traumatic wounds on the left and right mid tibia areas. The surface of these wounds is still not ready for closure. We have been using Iodoflex. Finally has areas on the  left knee. The latter wounds are all traumatic after a fall 10/9. His original wounds of the right posterior calf are superficial and progressing towards closure. His traumatic wounds on the left anterior tibia and right anterior tibia are considerably improved in terms of the wound surface. He asked me about a skin graft in these areas I do not think this is going to be necessary. Change from Iodosorb to [facility]. ooThe area on the left knee is just about closed as well 10/16; his original wound on the right posterior calf very superficial looks healthy. He has more recent traumatic wounds on the left anterior tibia and right anterior tibia both of these have better looking surfaces and measuring smaller. Might be beneficial for an advanced treatment product. The area on his left knee has a small superficial scab and we are going to close that when 10/23; his original wound areas on the right posterior calf continue to improve. The more recently traumatic wounds on the left anterior tibia and right anterior tibia both look better in terms of surfaces. No debridement required. We have been using Hydrofera Blue. He is approved for Apligraf 11/3; Apligraf #1 applied to the  left anterior tibia right anterior tibia and to 2 small areas remaining on the right posterior calf which were part of his initial wound area. 11/19; Apligraf #2. Left anterior tibia is healed. Right anterior tibia much better. Only one small area remains on the right posterior calf which was part of his initial wound area 12/3; the left anterior tibia has opened up again. Hyper granulated nodule. Right anterior tibia is superficial and larger. There is no depth of this. I did not place Apligraf today return to [facility] under compression 12/10; the patient has 2 remaining wounds 1 on the right posterior and the other on the left anterior. Both of these look quite good. No debridement is required we have been using  Hydrofera Blue under compression. 12/17; the patient has really not closed over which is disappointing. His wraps were very tight and there may be some wrap injury issues. On the left he has the original wound on the medial mid tibia he has a wrap injury on the lateral mid tibia. On the right his posterior areas are superficial but certainly not closed 12/31; the patient has totally closed on the left. He will transition to his own 20/30 below-knee stocking on the left leg today. Interestingly on the right posterior calf the 2 wounds that he had from last time of closed however there is an additional injury in this same area. Almost looks as though there is subcutaneous fluid/blistering. There is no evidence of cellulitis this does not seem tender 06/24/2019; the patient comes in having been placed in his 20/30 stockings on the left leg last week. He states that he noted blisters break open on Saturday and they had opened into wounds on the anterior tibial area on the left by Monday. The area on the posterior right leg looks satisfactory we are still doing that and compression. We are using calcium alginate to all wound areas 1/14; the patient had juxta light however one of them was not the right size which will need to be returned. The new area last week on the left anterior tibia looks a lot better it measures smaller I would say the area on the right is about the same, this is on the right posterior calf. We have been using silver alginate 1/21; he has his bilateral juxta lites however the left anterior wound continues to look better where is the 2 areas on the right posterior calf look about the same. We have been using Hydrofera Blue under compression with the juxta lite stockings now on standby 2/4; the areas on the left are totally healed and he will be discharged into his own stocking/juxta light. ooOn the right posterior calf he has what looks to be a blister with a small area of  subdermal hemorrhage this is very tiny but I did I am going to wrap him today and see if this will be reabsorbed by next week 2/11; he comes in this week with expanded wounds on the posterior calf as well as a new wound on the left anterior. He said the left anterior started a few days ago with a blister that is now surface. He is using his juxta lite stockings at a pressure of 30/40 Objective Constitutional Patient is hypertensive.. Pulse regular and within target range for patient.[name] Respirations regular, non-labored and within target range.. Temperature is normal and within the target range for the patient.[name] Appears in no distress. Vitals Time Taken: 1:55 PM, Height: 73 in, Weight: 290 lbs, BMI:  38.3, Temperature: 97.9 F, Pulse: 49 bpm, Respiratory Rate: 18 breaths/min, Blood Pressure: 146/80 mmHg. General Notes: Wound examoostill an area on the right posterior calf that was a blister last week and has reopened. There was another small wound so on the right he has 2 small superficial areas posteriorly. ooOn the left he has a sizable linear de-epithelialized area. These apparently started as blisters but he does not have a lot of evidence of edema. Most of this is already started to epithelialize Integumentary (Hair, Skin) Wound #6 status is Open. Original cause of wound was Gradually Appeared. The wound is located on the Right,Proximal,Posterior Lower Leg. The wound measures 3cm length x 2cm width x 0.1cm depth; 4.712cm^2 area and 0.471cm^3 volume. There is Fat Layer (Subcutaneous Tissue) Exposed exposed. There is no tunneling or undermining noted. There is a small amount of serosanguineous drainage noted. The wound margin is flat and intact. There is large (67-100%) pink granulation within the wound bed. There is no necrotic tissue within the wound bed. Wound #8RR status is Open. Original cause of wound was Gradually Appeared. The wound is located on the Left,Anterior Lower Leg. The  wound measures 0.6cm length x 0.4cm width x 0.1cm depth; 0.188cm^2 area and 0.019cm^3 volume. There is Fat Layer (Subcutaneous Tissue) Exposed exposed. There is no tunneling or undermining noted. There is a small amount of serosanguineous drainage noted. The wound margin is flat and intact. There is large (67-100%) pink granulation within the wound bed. There is no necrotic tissue within the wound bed. Assessment Active Problems ICD-10 Non-pressure chronic ulcer of right calf with fat layer exposed Type 2 diabetes mellitus with other skin ulcer Chronic venous hypertension (idiopathic) with inflammation of right lower extremity Laceration without foreign body, right lower leg, subsequent encounter Non-pressure chronic ulcer of other part of left lower leg with fat layer exposed Procedures Wound #6 Pre-procedure diagnosis of Wound #6 is a Venous Leg Ulcer located on the Right,Proximal,Posterior Lower Leg . There was a Three Layer Compression Therapy Procedure by Deon Pilling, RN. Post procedure Diagnosis Wound #6: Same as Pre-Procedure Wound #8RR Pre-procedure diagnosis of Wound #8RR is a Venous Leg Ulcer located on the Left,Anterior Lower Leg . There was a Three Layer Compression Therapy Procedure by Deon Pilling, RN. Post procedure Diagnosis Wound #8RR: Same as Pre-Procedure Plan Follow-up Appointments: Return Appointment in 1 week. Dressing Change Frequency: Do not change entire dressing for one week. Skin Barriers/Peri-Wound Care: Moisturizing lotion - both legs patient to lotion twice a day to left leg. Wound Cleansing: May shower with protection. Primary Wound Dressing: Wound #6 Right,Proximal,Posterior Lower Leg: Polymem Wound #8RR Left,Anterior Lower Leg: Polymem Secondary Dressing: Wound #6 Right,Proximal,Posterior Lower Leg: Dry Gauze Wound #8RR Left,Anterior Lower Leg: Dry Gauze Edema Control: 3 Layer Compression System - Bilateral Avoid standing for long periods  of time Elevate legs to the level of the heart or above for 30 minutes daily and/or when sitting, a frequency of: - 3-4 times a day. Exercise regularly 1 we have changed to polymen bilaterally and 3 layer compression bilaterally 2 his edema control did not look too bad. I am puzzled how this happened. Perhaps he needs to look at these areas even with his juxta lite stocking I am not sure about his activity level. 3. There is no evidence of infection 4. He does not seem to have an enough edema to justify compression pumps Electronic Signature(s) Signed: 07/29/2019 1:06:39 PM By: Linton Ham MD Entered By: Linton Ham on 07/28/2019 14:37:08 --------------------------------------------------------------------------------  SuperBill Details Patient Name: Date of Service: KARON, BRACEY 07/28/2019 Medical Record [id number] Patient Account Number: [id number] Date of Birth/Sex: Treating RN: [id number] (64 y.o. [name], Tammi Klippel Primary Care Provider: [name] Other Clinician: Referring Provider: Treating Provider/Extender:Branda Chaudhary, [name], [name] in Treatment: 51 Diagnosis Coding ICD-10 Codes Code Description [phone] Non-pressure chronic ulcer of right calf with fat layer exposed E11.622 Type 2 diabetes mellitus with other skin ulcer I87.321 Chronic venous hypertension (idiopathic) with inflammation of right lower extremity S81.811D Laceration without foreign body, right lower leg, subsequent encounter L97.822 Non-pressure chronic ulcer of other part of left lower leg with fat layer exposed Facility Procedures CPT4: Description Modifier Quantity Code A999333 BILATERAL: Application of multi-layer venous compression system; leg 1 (below knee), including ankle and foot. Physician Procedures CPT4 Code Description: [id number] - WC PHYS LEVEL 3 - EST PT ICD-10 Diagnosis Description L97.212 Non-pressure chronic ulcer of right calf with fat laye  E11.622 Type 2 diabetes mellitus with other skin ulcer S81.811D Laceration without foreign body,  right lower leg, subs Modifier: r exposed equent encount Quantity: 1 er Engineer, maintenance) Signed: 07/29/2019 1:06:39 PM By: Linton Ham MD Entered By: Linton Ham on 07/28/2019 14:37:52

## 2019-08-01 NOTE — Progress Notes (Signed)
GIL, INGWERSEN ([id number]) Visit Report for 07/28/2019 Arrival Information Details Patient Name: Date of Service: [name], [name] 07/28/2019 1:45 PM Medical Record [id number] Patient Account Number: [id number] Date of Birth/Sex: Treating RN: April 23, 1956 (64 y.o. Jonette Eva, Briant Cedar Primary Care Provider: [name] Other Clinician: Referring Provider: Treating Provider/Extender:Robson, [name], [name] in Treatment: 52 Visit Information History Since Last Visit Added or deleted any medications: No Patient Arrived: Ambulatory Any new allergies or adverse reactions: No Arrival Time: 13:54 Had a fall or experienced change in No Accompanied By: alone activities of daily living that may affect Transfer Assistance: None risk of falls: Patient Identification Verified: Yes Signs or symptoms of abuse/neglect since last No Secondary Verification Process Yes visito Completed: Hospitalized since last visit: No Patient Requires Transmission-Based No Implantable device outside of the clinic excluding No Precautions: cellular tissue based products placed in the center Patient Has Alerts: Yes since last visit: Patient Alerts: Patient on Blood Has Dressing in Place as Prescribed: Yes Thinner Has Compression in Place as Prescribed: Yes xarelto Pain Present Now: No R TBI = .86, L TBI = .87 Electronic Signature(s) Signed: 08/01/2019 6:14:31 PM By: Levan Hurst RN, BSN Entered By: Levan Hurst on 07/28/2019 13:54:33 -------------------------------------------------------------------------------- Compression Therapy Details Patient Name: Date of Service: [name] 07/28/2019 1:45 PM Medical Record [id number] Patient Account Number: [id number] Date of Birth/Sex: Treating RN: [birth year] (64 y.o. [name] Primary Care Provider: [name] Other Clinician: Referring Provider: Treating Provider/Extender:Robson,  [name], [name] in Treatment: 52 Compression Therapy Performed for Wound Wound #6 Right,Proximal,Posterior Lower Leg Assessment: Performed By: Clinician Deon Pilling, RN Compression Type: Three Layer Post Procedure Diagnosis Same as Pre-procedure Electronic Signature(s) Signed: 07/28/2019 5:57:11 PM By: Deon Pilling Entered By: Deon Pilling on 07/28/2019 14:21:54 -------------------------------------------------------------------------------- Compression Therapy Details Patient Name: Date of Service: [name] 07/28/2019 1:45 PM Medical Record [id number] Patient Account Number: [id number] Date of Birth/Sex: Treating RN: [birth year] (64 y.o. [name] Primary Care Provider: [name] Other Clinician: Referring Provider: Treating Provider/Extender:Robson, [name], [name] in Treatment: 52 Compression Therapy Performed for Wound Wound #8RR Left,Anterior Lower Leg Assessment: Performed By: Clinician Deon Pilling, RN Compression Type: Three Layer Post Procedure Diagnosis Same as Pre-procedure Electronic Signature(s) Signed: 07/28/2019 5:57:11 PM By: Deon Pilling Entered By: Deon Pilling on 07/28/2019 14:21:54 -------------------------------------------------------------------------------- Encounter Discharge Information Details Patient Name: Date of Service: [name] 07/28/2019 1:45 PM Medical Record [id number] Patient Account Number: [id number] Date of Birth/Sex: Treating RN: Nov 23, 1955 (64 y.o. [name] Primary Care Provider: [name] Other Clinician: Referring Provider: Treating Provider/Extender:Robson, [name], [name] in Treatment: 52 Encounter Discharge Information Items Discharge Condition: Stable Ambulatory Status: Ambulatory Discharge Destination: Home Transportation: Private Auto Accompanied By: self Schedule Follow-up Appointment:  Yes Clinical Summary of Care: Patient Declined Electronic Signature(s) Signed: 07/29/2019 5:30:09 PM By: Baruch Gouty RN, BSN Entered By: Baruch Gouty on 07/28/2019 14:50:46 -------------------------------------------------------------------------------- Lower Extremity Assessment Details Patient Name: Date of Service: [name] 07/28/2019 1:45 PM Medical Record [id number] Patient Account Number: [id number] Date of Birth/Sex: Treating RN: [birth year] (64 y.o. [name] Primary Care Provider: [name] Other Clinician: Referring Provider: Treating Provider/Extender:Robson, [name], [name] in Treatment: 52 Edema Assessment Assessed: [Left: No] [Right: No] Edema: [Left: No] [Right: No] Calf Left: Right: Point of Measurement: 45 cm From Medial Instep 36.8 cm 38 cm Ankle Left: Right: Point of Measurement: 13 cm From Medial Instep 23 cm 23 cm Vascular Assessment Pulses: Dorsalis Pedis Palpable: [Left:Yes] [Right:Yes]  Electronic Signature(s) Signed: 08/01/2019 6:14:31 PM By: Levan Hurst RN, BSN Entered By: Levan Hurst on 07/28/2019 14:05:21 -------------------------------------------------------------------------------- Multi Wound Chart Details Patient Name: Date of Service: [name] 07/28/2019 1:45 PM Medical Record [id number] Patient Account Number: [id number] Date of Birth/Sex: Treating RN: [birth year] (64 y.o. [name] Primary Care Provider: [name] Other Clinician: Referring Provider: Treating Provider/Extender:Robson, [name], [name] in Treatment: 52 Vital Signs Height(in): 73 Pulse(bpm): 45 Weight(lbs): 290 Blood Pressure(mmHg): 146/80 Body Mass Index(BMI): 38 Temperature(F): 97.9 Respiratory 18 Rate(breaths/min): Photos: [6:No Photos] [8RR:No Photos] [N/A:N/A] Wound Location: [6:Right Lower Leg - Posterior, Left Lower Leg - Anterior Proximal]  [N/A:N/A] Wounding Event: [6:Gradually Appeared] [8RR:Gradually Appeared] [N/A:N/A] Primary Etiology: [6:Venous Leg Ulcer] [8RR:Venous Leg Ulcer] [N/A:N/A] Comorbid History: [6:Asthma, Sleep Apnea, Arrhythmia, Congestive Heart Failure, Hypertension, Heart Failure, Hypertension, Type II Diabetes] [8RR:Asthma, Sleep Apnea, Arrhythmia, Congestive Type II Diabetes] [N/A:N/A] Date Acquired: [6:01/06/2019] [8RR:02/14/2019] [N/A:N/A] Weeks of Treatment: [6:29] [8RR:23] [N/A:N/A] Wound Status: [6:Open] [8RR:Open] [N/A:N/A] Wound Recurrence: [6:No] [8RR:Yes] [N/A:N/A] Clustered Wound: [6:Yes] [8RR:No] [N/A:N/A] Clustered Quantity: [6:2] [8RR:N/A] [N/A:N/A] Measurements L x W x D 3x2x0.1 [8RR:0.6x0.4x0.1] [N/A:N/A] (cm) Area (cm) : [6:4.712] [8RR:0.188] [N/A:N/A] Volume (cm) : [6:0.471] [8RR:0.019] [N/A:N/A] % Reduction in Area: [6:-1099.00%] [8RR:97.00%] [N/A:N/A] % Reduction in Volume: -1107.70% [8RR:97.00%] [N/A:N/A] Classification: [6:Full Thickness Without Exposed Support Structures Exposed Support Structures] [8RR:Full Thickness Without] [N/A:N/A] Exudate Amount: [6:Small] [8RR:Small] [N/A:N/A] Exudate Type: [6:Serosanguineous] [8RR:Serosanguineous] [N/A:N/A] Exudate Color: [6:red, brown] [8RR:red, brown] [N/A:N/A] Wound Margin: [6:Flat and Intact] [8RR:Flat and Intact] [N/A:N/A] Granulation Amount: [6:Large (67-100%)] [8RR:Large (67-100%)] [N/A:N/A] Granulation Quality: [6:Pink] [8RR:Pink] [N/A:N/A] Necrotic Amount: [6:None Present (0%)] [8RR:None Present (0%)] [N/A:N/A] Exposed Structures: [6:Fat Layer (Subcutaneous Fat Layer (Subcutaneous N/A Tissue) Exposed: Yes Fascia: No Tendon: No Muscle: No Joint: No Bone: No] [8RR:Tissue) Exposed: Yes Fascia: No Tendon: No Muscle: No Joint: No Bone: No] Epithelialization: [6:Medium (34-66%)] [8RR:Large (67-100%) Compression Therapy] [N/A:N/A N/A] Treatment Notes Electronic Signature(s) Signed: 07/28/2019 5:57:11 PM By: Deon Pilling Signed:  07/29/2019 1:06:39 PM By: Linton Ham MD Entered By: Linton Ham on 07/28/2019 14:30:30 -------------------------------------------------------------------------------- [place] Details Patient Name: Date of Service: [name]. 07/28/2019 1:45 PM Medical Record [id number] Patient Account Number: [id number] Date of Birth/Sex: Treating RN: Jun 02, 1956 (64 y.o. [name] Primary Care Provider: [name] Other Clinician: Referring Provider: Treating Provider/Extender:Robson, [name], [name] in Treatment: 52 Active Inactive Venous Leg Ulcer Nursing Diagnoses: Actual venous Insuffiency (use after diagnosis is confirmed) Knowledge deficit related to disease process and management Goals: Non-invasive venous studies are completed as ordered Date Initiated: 09/17/2018 Date Inactivated: 09/23/2018 Target Resolution Date: 09/23/2018 Goal Status: Met Patient will maintain optimal edema control Date Initiated: 09/17/2018 Target Resolution Date: 09/09/2019 Goal Status: Active Patient/caregiver will verbalize understanding of disease process and disease management Date Initiated: 09/17/2018 Date Inactivated: 09/23/2018 Target Resolution Date: 10/15/2018 Goal Status: Met Interventions: Assess peripheral edema status every visit. Compression as ordered Provide education on venous insufficiency Treatment Activities: Therapeutic compression applied : 09/17/2018 Notes: Electronic Signature(s) Signed: 07/28/2019 5:57:11 PM By: Deon Pilling Entered By: Deon Pilling on 07/28/2019 14:14:45 -------------------------------------------------------------------------------- Pain Assessment Details Patient Name: Date of Service: NOVAH, GOZA 07/28/2019 1:45 PM Medical Record [id number] Patient Account Number: [id number] Date of Birth/Sex: Treating RN: [birth year] (64 y.o. [name] Primary Care Provider: [name] Other Clinician: Referring Provider: Treating Provider/Extender:Robson, [name], [name] in Treatment: 52 Active Problems Location of Pain Severity and Description of Pain Patient Has Paino No Site Locations Pain Management and Medication Current Pain Management:  Electronic Signature(s) Signed: 08/01/2019 6:14:31 PM By: Levan Hurst RN, BSN Entered By: Levan Hurst on 07/28/2019 13:56:21 -------------------------------------------------------------------------------- Patient/Caregiver Education Details Patient Name: Date of Service: [name] 2/11/2021andnbsp1:45 PM Medical Record [id number] Patient Account Number: [id number] Date of Birth/Gender: Treating RN: Apr 07, 1956 (63 y.o. [name] Primary Care Physician: [name] Other Clinician: Referring Physician: Treating Physician/Extender:Robson, [name], [name] in Treatment: 27 Education Assessment Education Provided To: Patient Education Topics Provided Wound/Skin Impairment: Handouts: Skin Care Do's and Dont's Methods: Explain/Verbal Responses: Reinforcements needed Electronic Signature(s) Signed: 07/28/2019 5:57:11 PM By: Deon Pilling Entered By: Deon Pilling on 07/28/2019 14:14:57 -------------------------------------------------------------------------------- Wound Assessment Details Patient Name: Date of Service: KIREE, DEJARNETTE 07/28/2019 1:45 PM Medical Record [id number] Patient Account Number: [id number] Date of Birth/Sex: Treating RN: 1956/01/06 (64 y.o. [name], Tammi Klippel Primary Care Provider: [name] Other Clinician: Referring Provider: Treating Provider/Extender:Robson, [name], [name] in Treatment: 52 Wound Status Wound Number: 6 Primary Venous Leg Ulcer Etiology: Wound Location: Right Lower Leg - Posterior, Proximal Wound Open Status: Wounding Event: Gradually Appeared Comorbid  Asthma, Sleep Apnea, Arrhythmia, Congestive Date Acquired: 01/06/2019 History: Heart Failure, Hypertension, Type II Diabetes Weeks Of Treatment: 29 Clustered Wound: Yes Wound Measurements Length: (cm) 3 % Reduct Width: (cm) 2 % Reduct Depth: (cm) 0.1 Epitheli Clustered Quantity: 2 Tunnelin Area: (cm) 4.712 Undermi Volume: (cm) 0.471 Wound Description Full Thickness Without Exposed Support Foul Odo Classification: Structures Slough/F Wound Flat and Intact Margin: Exudate Small Amount: Exudate Serosanguineous Type: Exudate red, brown Color: Wound Bed Granulation Amount: Large (67-100%) Granulation Quality: Pink Fascia E Necrotic Amount: None Present (0%) Fat Laye Tendon E Muscle E Joint Ex Bone Exp Treatment Notes Wound #6 (Right, Proximal, Posterior Lower Leg) 2. Periwound Care Moisturizing lotion 3. Primary Dressing Applied Polymem 6. Support Layer Applied 3 layer compression wrap r After Cleansing: No ibrino No Exposed Structure xposed: No r (Subcutaneous Tissue) Exposed: Yes xposed: No xposed: No posed: No osed: No ion in Area: -1099% ion in Volume: -1107.7% alization: Medium (34-66%) g: No ning: No Electronic Signature(s) Signed: 07/28/2019 5:57:11 PM By: Deon Pilling Entered By: Deon Pilling on 07/28/2019 14:23:45 -------------------------------------------------------------------------------- Wound Assessment Details Patient Name: Date of Service: HARBERT, FITTERER 07/28/2019 1:45 PM Medical Record [id number] Patient Account Number: [id number] Date of Birth/Sex: Treating RN: 08/20/1955 (64 y.o. [name] Primary Care Provider: [name] Other Clinician: Referring Provider: Treating Provider/Extender:Robson, [name], [name] in Treatment: 52 Wound Status Wound Number: 8RR Primary Venous Leg Ulcer Etiology: Wound Location: Left Lower Leg - Anterior Wound Open Wounding Event: Gradually  Appeared Status: Date Acquired: 02/14/2019 Comorbid Asthma, Sleep Apnea, Arrhythmia, Congestive Weeks Of Treatment: 23 History: Heart Failure, Hypertension, Type II Diabetes Clustered Wound: No Wound Measurements Length: (cm) 0.6 % Reduct Width: (cm) 0.4 % Reduct Depth: (cm) 0.1 Epitheli Area: (cm) 0.188 Tunneli Volume: (cm) 0.019 Undermi Wound Description Classification: Full Thickness Without Exposed Support Foul Odo Structures Slough/F Wound Flat and Intact Margin: Exudate Small Amount: Exudate Serosanguineous Type: Exudate red, brown Color: Wound Bed Granulation Amount: Large (67-100%) Granulation Quality: Pink Fascia E Necrotic Amount: None Present (0%) Fat Layer ( Tendon Expo Muscle Expo Joint Expos Bone Expose r After Cleansing: No ibrino No Exposed Structure xposed: No Subcutaneous Tissue) Exposed: Yes sed: No sed: No ed: No d: No ion in Area: 97% ion in Volume: 97% alization: Large (67-100%) ng: No ning: No Treatment Notes Wound #8RR (Left, Anterior Lower Leg) 2. Periwound Care Moisturizing lotion 3. Primary Dressing Applied Polymem 6.  Support Layer Applied 3 layer compression Water quality scientist) Signed: 08/01/2019 6:14:31 PM By: Levan Hurst RN, BSN Entered By: Levan Hurst on 07/28/2019 14:08:08 -------------------------------------------------------------------------------- [place] Details Patient Name: Date of Service: [name] 07/28/2019 1:45 PM Medical Record [id number] Patient Account Number: [id number] Date of Birth/Sex: Treating RN: [birth year] (64 y.o. [name] Primary Care Neveyah Garzon: [name] Other Clinician: Referring Kameran Mcneese: Treating Andee Chivers/Extender:Robson, [name], [name] in Treatment: 52 Vital Signs Time Taken: 13:55 Temperature (F): 97.9 Height (in): 73 Pulse (bpm): 49 Weight (lbs): 290 Respiratory Rate (breaths/min): 18 Body Mass Index (BMI):  38.3 Blood Pressure (mmHg): 146/80 Reference Range: 80 - 120 mg / dl Electronic Signature(s) Signed: 08/01/2019 6:14:31 PM By: Levan Hurst RN, BSN Entered By: Levan Hurst on 07/28/2019 13:56:08

## 2019-08-05 ENCOUNTER — Other Ambulatory Visit: Payer: Self-pay

## 2019-08-05 ENCOUNTER — Encounter (HOSPITAL_BASED_OUTPATIENT_CLINIC_OR_DEPARTMENT_OTHER): Payer: BC Managed Care – PPO | Admitting: Internal Medicine

## 2019-08-05 DIAGNOSIS — Z7901 Long term (current) use of anticoagulants: Secondary | ICD-10-CM | POA: Diagnosis not present

## 2019-08-05 DIAGNOSIS — I87311 Chronic venous hypertension (idiopathic) with ulcer of right lower extremity: Secondary | ICD-10-CM | POA: Diagnosis not present

## 2019-08-05 DIAGNOSIS — I890 Lymphedema, not elsewhere classified: Secondary | ICD-10-CM | POA: Diagnosis not present

## 2019-08-05 DIAGNOSIS — I429 Cardiomyopathy, unspecified: Secondary | ICD-10-CM | POA: Diagnosis not present

## 2019-08-05 DIAGNOSIS — I5022 Chronic systolic (congestive) heart failure: Secondary | ICD-10-CM | POA: Diagnosis not present

## 2019-08-05 DIAGNOSIS — G4730 Sleep apnea, unspecified: Secondary | ICD-10-CM | POA: Diagnosis not present

## 2019-08-05 DIAGNOSIS — L97822 Non-pressure chronic ulcer of other part of left lower leg with fat layer exposed: Secondary | ICD-10-CM | POA: Diagnosis not present

## 2019-08-05 DIAGNOSIS — E11622 Type 2 diabetes mellitus with other skin ulcer: Secondary | ICD-10-CM | POA: Diagnosis not present

## 2019-08-05 DIAGNOSIS — J45909 Unspecified asthma, uncomplicated: Secondary | ICD-10-CM | POA: Diagnosis not present

## 2019-08-05 DIAGNOSIS — I4891 Unspecified atrial fibrillation: Secondary | ICD-10-CM | POA: Diagnosis not present

## 2019-08-05 DIAGNOSIS — L97212 Non-pressure chronic ulcer of right calf with fat layer exposed: Secondary | ICD-10-CM | POA: Diagnosis not present

## 2019-08-05 DIAGNOSIS — E875 Hyperkalemia: Secondary | ICD-10-CM | POA: Diagnosis not present

## 2019-08-05 DIAGNOSIS — I872 Venous insufficiency (chronic) (peripheral): Secondary | ICD-10-CM | POA: Diagnosis not present

## 2019-08-05 DIAGNOSIS — S81802A Unspecified open wound, left lower leg, initial encounter: Secondary | ICD-10-CM | POA: Diagnosis not present

## 2019-08-05 DIAGNOSIS — Z8616 Personal history of COVID-19: Secondary | ICD-10-CM | POA: Diagnosis not present

## 2019-08-05 DIAGNOSIS — I110 Hypertensive heart disease with heart failure: Secondary | ICD-10-CM | POA: Diagnosis not present

## 2019-08-08 NOTE — Progress Notes (Signed)
[name], [name] ([name]) Visit Report for 08/05/2019 HPI Details Patient Name: Date of Service: [name], [name] 08/05/2019 1:45 PM Medical Record [name] Patient Account Number: [id number] Date of Birth/Sex: Treating RN: [birth year] (64 y.o. [name] RepressMarvis [name] Primary Care Provider: Pricilla [name] Other Clinician: Referring Provider: Treating Provider/Extender:[name], [name], [name] in Treatment: 60 History of Present Illness HPI Description: ADMISSION 07/29/2018 [name] is a 64 year old man with either prediabetes or diabetes he is on glipizide. In late October to November 2019 he noted a scabbed area on the back of his right calf. He picked this off a few times but it would not heal. He saw his primary physician on 12/5. It was felt at that time that this may actually heal on its own with conservative management. The next visit was on 07/20/2018 noted the area was a lot worse and arranged for his treatment here. He has been topical antibiotics like Neosporin although he stopped using this when the wound looked worse. He is just been covering this with a clean Band-Aid. He is given Bactrim a week ago and he is finishing this currently. It is made some improvement in the surrounding erythema per the patient. He does not have a history of nonhealing wounds. No prior history of wounds on his legs that he had difficulty healing. No prior skin issues. 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 Past medical history; A. fib status post ablation and recent cardioversion he is on Xarelto. He has a history of systolic heart failure, cervical radiculopathy, cardiomyopathy and type 2 diabetes as discussed ABI in the right leg was noncompressible on the right 2/20; the biopsy I did on the patient last week was negative for malignancy. Culture grew Pseudomonas and methicillin sensitive staph aureus. He is on cefdinir 300 twice a day. I will have to  make this a 10 day course. He put him in compression. He states that the redness pain and erythema are a lot better. I suspect the patient has chronic venous insufficiency probably with a secondary cellulitis 2/27; arrives today with copious amounts of drainage coming out of the wound irritating the skin below the wound area. He only renewed the final 3 days of cefdinir today 3/5; came in for a nurse change 3 days ago. Again a lot of drainage noted. Zinc oxide was applied unfortunately the drainage appears to a pool then he has a string of superficial open areas extending down into the Achilles area and some just below the wound. The wound itself does not look too bad. He is completed the antibiotics although apparently there was separation from the original 7 with the last 3 days. Culture grew a few MSSA and a few Pseudomonas 3/12; not too much difference over the last week. He came in for a dressing change on Monday by our nursing staff. Necrotic debris again over the wound surface. The entire area looks irritated but nontender and I do not think shows obvious evidence of infection. We have not heard anything about the reflux studies. Also notable than in this diabetic man we had noncompressible vessels and although I can feel his pulses easily in his feet I will order arterial studies as well. 3/19-Patient had experience more pain has been dressed with a silver alginate, the wounds all have necrotic debris, very friable with easy bleeding with any kind of surface debridement including with gauze and Anasept and with a #3 curette. His vascular appointments unfortunately were all canceled on  account of the virus outbreak resulting in studies only being done for emergent cases. His pulses are easily palpable in the lower extremity. He has open areas below the primary wound where he states the drainage which included purulent material also with some blood pooled and caused breakdown. 3/26; the  patient was changed to Santyl with [facility] backing last week. This was largely because the silver alginate was sticking to the wound. He is still having quite a bit of pain. He tells [place] that the reflux studies and arterial studies we had attempted to arrange through vein and vascular are not being booked until mid May. Culture that was done last week showed both Serratia moderate and a few methicillin sensitive staph aureus I started him on Bactrim DS 1 p.o. twice daily on 3/23 for 7 days. He is here for follow-up. 4/3; patient is on Santyl with Hydrofera Blue. Patient complains of pain. Our intake nurse is noted drainage and some odor. He has completed Bactrim recently for Serratia and a few methicillin sensitive staph aureus. After our staff push hard last week we were able to get venous studies as well as arterial studies. His arterial studies showed on the right great toe pressure of 0.86. On the left ABI at 1.47 TBI of 0.87. On both sides waveforms were triphasic VENOUS STUDIES showed reflux in the femoral vein, popliteal vein great saphenous vein at the saphenofemoral junction and great saphenous vein at the proximal thigh. Also noted to have age-indeterminate superficial vein thrombosis involving the small saphenous vein. Notable that the patient is already on Xarelto for atrial fibrillation. 4/9; the patient has been to see vascular surgery and reviewed by Dr. Doren [name]. He was felt to have only minimal superficial venous reflux in the right great greater saphenous vein. For this reason he was not felt to be a candidate for laser ablation of the right greater saphenous vein. A wedge cushion was suggested to keep his leg elevated. He does have deep venous reflux on the right. Culture I did last time showed a combination of Serratia Pseudomonas and methicillin sensitive staph aureus. We have been using Hydrofera Blue to the large wound, Santyl to some of the deep punched out satellite  lesions distal to it. There is some improvement [id number]; the application for Apligraf was put out for further review for material that we have resubmitted. He has the deep area on the right posterior calf which is large and then 3 small punched out areas beneath this. In general his wounds look a lot better 4/23; the patient has his large original wound and then 2 punched out satellite lesions below it on the right posterior calf. I was usually able to use Apligraf #1 to cover the full surface area of the larger wound. We continued with Santyl and Hydrofera Blue on the 2 punched-out areas 5/7; patient has his large original wound and 2 punched out satellite lesions below it in the right posterior calf. Apligraf #2 today. Major improvement in the big wound in terms of wound depth. Punched-out [place] wounds have a very healthy looking wound bed. 5/21-Patient comes back for his large right posterior calf wound for which she has been an Apligraf #3 today. Wound depth seems to be better, the punched-out wounds appear to have healthy bed with some bleeding 6/4; right posterior calf wound is much better. Apligraf #4 today. The major wound has no wound depth at all. Only 1 of the satellite lesions is still open. The patient  has very high blood pressure coming in today with a diastolic blood pressure of 130 after lying there for a few minutes it was down to 110. He states that is running [id number] diastolic at home. He was high last week as well I thought he was on 50 mg 1/2 tablet twice daily of losartan I told him to double up on that however it turns out he is actually on 50 twice daily. Course he is run out of his medications prematurely. He has a follow-up with his primary's office next Wednesday. 6/18; patient's wounds continue to improve. The small area laterally is just about closed and there is continued epithelialization on the area on the posterior calf. 7/2; we applied his last Apligraf last  visit. Early the next week there was a lot of purulent looking drainage that I cultured this grew staph and Pseudomonas however when we had him back for the next visit to 3 days later everything looked a lot better. He did not receive systemic antibiotics. Fortunately we did not have to remove the Apligraf. He has had heart trouble this week he was having an ablation apparently they have discovered a clot in his left atrium they have changed all his anticoagulants as well as his blood pressure medication. 7/16; using Hydrofera Blue. We are making nice progress towards closure. He has not yet ordered his compression stockings he has his measurements 7/23; dimensions are better. He has a new satellite area superiorly. Both wounds cauterized with silver nitrate. 7/30; absolutely no change in the major wound on the posterior part of the right calf. We have been using Hydrofera Blue for a prolonged period of time and really had a nice improvement but over the last few weeks this is stalled. There is no depth. He arrived in clinic today with a new area on the right anterior tibia which apparently was an "ingrown hair". It is clearly an open area. This looks like a wrap injury although he was not really aware of it. 8/31; since the patient was last in clinic he was admitted to hospital from 8/17 through 8/22. He had had multiple falls at home including one off a ladder. He was admitted to hospital with mild COVID-19 viral pneumonia. Noted to be hyperkalemic and in acute renal failure. He has chronic systolic heart failure with ejection fraction of 35%. He arrives back in clinic with the original wound on the posterior aspect of the right lower calf looking a lot better. He has eschared areas from falls and lacerations on his anterior knees bilaterally just below the patella and on the mid part of his tibia bilaterally. He seems to have a small area on the right lateral ankle as well 9/10- Patient comes  to clinic with a 2 new wounds one on the left anterior shin and one on the right posterior leg both the left knee and right knee areas are healed up. We have been using Santyl to the left anterior leg and silver alginate to right posterior leg wounds. 9/17; the patient is original wound looks good on the posterior right calf. He has traumatic areas on the left anterior shin left anterior patellar tendon and the right mid tibia area. Except for the right posterior calf all required debridement. We have been using Santyl on these areas and silver alginate posteriorly right calf 9/24; the original wound on the posterior calf on the right looks good. This is healthy. There are traumatic wounds in the left anterior shin,  left anterior patella in the right mid tibia area are about the same. He has dusky erythema on the left anterior tibia which led me to give him antibiotics last week. This does not look too much different. This is nontender and I wonder if this is all just venous inflammation. 10/2; the patient has 2 open areas on the right posterior calf both of these look good we have been using silver alginate. He has traumatic wounds on the left and right mid tibia areas. The surface of these wounds is still not ready for closure. We have been using Iodoflex. Finally has areas on the left knee. The latter wounds are all traumatic after a fall 10/9. His original wounds of the right posterior calf are superficial and progressing towards closure. His traumatic wounds on the left anterior tibia and right anterior tibia are considerably improved in terms of the wound surface. He asked me about a skin graft in these areas I do not think this is going to be necessary. Change from Iodosorb to [facility]. The area on the left knee is just about closed as well 10/16; his original wound on the right posterior calf very superficial looks healthy. He has more recent traumatic wounds on the left anterior  tibia and right anterior tibia both of these have better looking surfaces and measuring smaller. Might be beneficial for an advanced treatment product. The area on his left knee has a small superficial scab and we are going to close that when 10/23; his original wound areas on the right posterior calf continue to improve. The more recently traumatic wounds on the left anterior tibia and right anterior tibia both look better in terms of surfaces. No debridement required. We have been using Hydrofera Blue. He is approved for Apligraf 11/3; Apligraf #1 applied to the left anterior tibia right anterior tibia and to 2 small areas remaining on the right posterior calf which were part of his initial wound area. 11/19; Apligraf #2. Left anterior tibia is healed. Right anterior tibia much better. Only one small area remains on the right posterior calf which was part of his initial wound area 12/3; the left anterior tibia has opened up again. Hyper granulated nodule. Right anterior tibia is superficial and larger. There is no depth of this. I did not place Apligraf today return to [facility] under compression 12/10; the patient has 2 remaining wounds 1 on the right posterior and the other on the left anterior. Both of these look quite good. No debridement is required we have been using Hydrofera Blue under compression. 12/17; the patient has really not closed over which is disappointing. His wraps were very tight and there may be some wrap injury issues. On the left he has the original wound on the medial mid tibia he has a wrap injury on the lateral mid tibia. On the right his posterior areas are superficial but certainly not closed 12/31; the patient has totally closed on the left. He will transition to his own 20/30 below-knee stocking on the left leg today. Interestingly on the right posterior calf the 2 wounds that he had from last time of closed however there is an additional injury in this same  area. Almost looks as though there is subcutaneous fluid/blistering. There is no evidence of cellulitis this does not seem tender 06/24/2019; the patient comes in having been placed in his 20/30 stockings on the left leg last week. He states that he noted blisters break open on Saturday and  they had opened into wounds on the anterior tibial area on the left by Monday. The area on the posterior right leg looks satisfactory we are still doing that and compression. We are using calcium alginate to all wound areas 1/14; the patient had juxta light however one of them was not the right size which will need to be returned. The new area last week on the left anterior tibia looks a lot better it measures smaller I would say the area on the right is about the same, this is on the right posterior calf. We have been using silver alginate 1/21; he has his bilateral juxta lites however the left anterior wound continues to look better where is the 2 areas on the right posterior calf look about the same. We have been using Hydrofera Blue under compression with the juxta lite stockings now on standby 2/4; the areas on the left are totally healed and he will be discharged into his own stocking/juxta light. On the right posterior calf he has what looks to be a blister with a small area of subdermal hemorrhage this is very tiny but I did I am going to wrap him today and see if this will be reabsorbed by next week 2/11; he comes in this week with expanded wounds on the posterior calf as well as a new wound on the left anterior. He said the left anterior started a few days ago with a blister that is now surface. He is using his juxta lite stockings at a pressure of 30/40 2/19; we wrapped his left leg last week after a failed attempt to discharge that leg and his juxta lites 2 weeks ago. The leg is again healed he has superficial areas posteriorly on the right leg Electronic Signature(s) Signed: 08/05/2019 6:29:59 PM  By: Linton Ham MD Entered By: Linton Ham on 08/05/2019 15:27:50 -------------------------------------------------------------------------------- Physical Exam Details Patient Name: Date of Service: [name] 08/05/2019 1:45 PM Medical Record [id number] Patient Account Number: [id number] Date of Birth/Sex: Treating RN: 11-16-1955 (64 y.o. [name] Primary Care Provider: Pricilla [name] Other Clinician: Referring Provider: Treating Provider/Extender:Faline Langer, [name], [name] in Treatment: 62 Constitutional Patient is hypertensive.. Pulse regular and within target range for patient.[name] Respirations regular, non-labored and within target range.. Temperature is normal and within the target range for the patient.[name] Appears in no distress. Cardiovascular Pedal pulses palpable and strong bilaterally.. We have good edema control bilaterally. Integumentary (Hair, Skin) Changes of chronic venous insufficiency. Notes Wound exam Still an area on the right posterior calf 2 separate areas. No blisters. There is nothing open on the left leg Electronic Signature(s) Signed: 08/05/2019 6:29:59 PM By: Linton Ham MD Entered By: Linton Ham on 08/05/2019 15:29:05 -------------------------------------------------------------------------------- Physician Orders Details Patient Name: Date of Service: [name] 08/05/2019 1:45 PM Medical Record [id number] Patient Account Number: [id number] Date of Birth/Sex: Treating RN: Dec 23, 1955 (64 y.o. [name] Primary Care Provider: Pricilla [name] Other Clinician: Referring Provider: Treating Provider/Extender:Lisabeth Mian, [name], [name] in Treatment: 53 Verbal / Phone Orders: No Diagnosis Coding ICD-10 Coding Code Description [phone] Non-pressure chronic ulcer of right calf with fat layer exposed E11.622 Type 2 diabetes mellitus with other skin  ulcer I87.321 Chronic venous hypertension (idiopathic) with inflammation of right lower extremity S81.811D Laceration without foreign body, right lower leg, subsequent encounter L97.822 Non-pressure chronic ulcer of other part of left lower leg with fat layer exposed Follow-up Appointments Return Appointment in 1 week. Dressing Change Frequency Do not change entire dressing  for one week. Skin Barriers/Peri-Wound Care Moisturizing lotion Wound Cleansing May shower with protection. Primary Wound Dressing Wound #6 Right,Proximal,Posterior Lower Leg Polymem Secondary Dressing Wound #6 Right,Proximal,Posterior Lower Leg Dry Gauze Edema Control 3 Layer Compression System - Right Lower Extremity Avoid standing for long periods of time Elevate legs to the level of the heart or above for 30 minutes daily and/or when sitting, a frequency of: - 3-4 times a day. Exercise regularly Support Garment 30-40 mm/Hg pressure to: - Juxtalite to left lower leg Electronic Signature(s) Signed: 08/05/2019 6:29:59 PM By: Linton Ham MD Signed: 08/08/2019 1:40:10 PM By: Kela Millin Entered By: Kela Millin on 08/05/2019 15:23:46 -------------------------------------------------------------------------------- Problem List Details Patient Name: Date of Service: [name] 08/05/2019 1:45 PM Medical Record [id number] Patient Account Number: [id number] Date of Birth/Sex: Treating RN: 04-22-1956 (64 y.o. [name] Primary Care Provider: Pricilla [name] Other Clinician: Referring Provider: Treating Provider/Extender:Treyon Wymore, [name], [name] in Treatment: 53 Active Problems ICD-10 Evaluated Encounter Evaluated Encounter Code Description Active Date Today Diagnosis L97.212 Non-pressure chronic ulcer of right calf with fat layer 07/29/2018 No Yes exposed E11.622 Type 2 diabetes mellitus with other skin ulcer 07/29/2018 No Yes I87.321 Chronic venous  hypertension (idiopathic) with 07/29/2018 No Yes inflammation of right lower extremity S81.811D Laceration without foreign body, right lower leg, 02/14/2019 No Yes subsequent encounter L97.822 Non-pressure chronic ulcer of other part of left lower 06/30/2019 No Yes leg with fat layer exposed Inactive Problems ICD-10 Code Description Active Date Inactive Date S81.812D Laceration without foreign body, left lower leg, subsequent 02/14/2019 02/14/2019 encounter L03.115 Cellulitis of right lower limb 09/24/2018 09/24/2018 Resolved Problems Electronic Signature(s) Signed: 08/05/2019 6:29:59 PM By: Linton Ham MD Entered By: Linton Ham on 08/05/2019 15:24:38 -------------------------------------------------------------------------------- Progress Note Details Patient Name: Date of Service: [name] 08/05/2019 1:45 PM Medical Record [id number] Patient Account Number: [id number] Date of Birth/Sex: Treating RN: 1956/04/14 (64 y.o. [name] Primary Care Provider: Pricilla [name] Other Clinician: Referring Provider: Treating Provider/Extender:Quinnlyn Hearns, [name], [name] in Treatment: 53 Subjective History of Present Illness (HPI) ADMISSION 07/29/2018 Mr. Zerkel is a 64 year old man with either prediabetes or diabetes he is on glipizide. In late October to November 2019 he noted a scabbed area on the back of his right calf. He picked this off a few times but it would not heal. He saw his primary physician on 12/5. It was felt at that time that this may actually heal on its own with conservative management. The next visit was on 07/20/2018 noted the area was a lot worse and arranged for his treatment here. He has been topical antibiotics like Neosporin although he stopped using this when the wound looked worse. He is just been covering this with a clean Band-Aid. He is given Bactrim a week ago and he is finishing this currently. It is made some  improvement in the surrounding erythema per the patient. He does not have a history of nonhealing wounds. No prior history of wounds on his legs that he had difficulty healing. No prior skin issues. 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 Past medical history; A. fib status post ablation and recent cardioversion he is on Xarelto. He has a history of systolic heart failure, cervical radiculopathy, cardiomyopathy and type 2 diabetes as discussed ABI in the right leg was noncompressible on the right 2/20; the biopsy I did on the patient last week was negative for malignancy. Culture grew Pseudomonas and methicillin sensitive staph aureus. He is on  cefdinir 300 twice a day. I will have to make this a 10 day course. He put him in compression. He states that the redness pain and erythema are a lot better. I suspect the patient has chronic venous insufficiency probably with a secondary cellulitis 2/27; arrives today with copious amounts of drainage coming out of the wound irritating the skin below the wound area. He only renewed the final 3 days of cefdinir today 3/5; came in for a nurse change 3 days ago. Again a lot of drainage noted. Zinc oxide was applied unfortunately the drainage appears to a pool then he has a string of superficial open areas extending down into the Achilles area and some just below the wound. The wound itself does not look too bad. He is completed the antibiotics although apparently there was separation from the original 7 with the last 3 days. Culture grew a few MSSA and a few Pseudomonas 3/12; not too much difference over the last week. He came in for a dressing change on Monday by our nursing staff. Necrotic debris again over the wound surface. The entire area looks irritated but nontender and I do not think shows obvious evidence of infection. We have not heard anything about the reflux studies. Also notable than in this diabetic man we had noncompressible  vessels and although I can feel his pulses easily in his feet I will order arterial studies as well. 3/19-Patient had experience more pain has been dressed with a silver alginate, the wounds all have necrotic debris, very friable with easy bleeding with any kind of surface debridement including with gauze and Anasept and with a #3 curette. His vascular appointments unfortunately were all canceled on account of the virus outbreak resulting in studies only being done for emergent cases. His pulses are easily palpable in the lower extremity. He has open areas below the primary wound where he states the drainage which included purulent material also with some blood pooled and caused breakdown. 3/26; the patient was changed to Santyl with [facility] backing last week. This was largely because the silver alginate was sticking to the wound. He is still having quite a bit of pain. He tells [place] that the reflux studies and arterial studies we had attempted to arrange through vein and vascular are not being booked until mid May. Culture that was done last week showed both Serratia moderate and a few methicillin sensitive staph aureus I started him on Bactrim DS 1 p.o. twice daily on 3/23 for 7 days. He is here for follow-up. 4/3; patient is on Santyl with Hydrofera Blue. Patient complains of pain. Our intake nurse is noted drainage and some odor. He has completed Bactrim recently for Serratia and a few methicillin sensitive staph aureus. After our staff push hard last week we were able to get venous studies as well as arterial studies. His arterial studies showed on the right great toe pressure of 0.86. On the left ABI at 1.47 TBI of 0.87. On both sides waveforms were triphasic VENOUS STUDIES showed reflux in the femoral vein, popliteal vein great saphenous vein at the saphenofemoral junction and great saphenous vein at the proximal thigh. Also noted to have age-indeterminate superficial vein thrombosis  involving the small saphenous vein. Notable that the patient is already on Xarelto for atrial fibrillation. 4/9; the patient has been to see vascular surgery and reviewed by Dr. Doren [name]. He was felt to have only minimal superficial venous reflux in the right great greater saphenous vein. For this  reason he was not felt to be a candidate for laser ablation of the right greater saphenous vein. A wedge cushion was suggested to keep his leg elevated. He does have deep venous reflux on the right. Culture I did last time showed a combination of Serratia Pseudomonas and methicillin sensitive staph aureus. We have been using Hydrofera Blue to the large wound, Santyl to some of the deep punched out satellite lesions distal to it. There is some improvement [id number]; the application for Apligraf was put out for further review for material that we have resubmitted. He has the deep area on the right posterior calf which is large and then 3 small punched out areas beneath this. In general his wounds look a lot better 4/23; the patient has his large original wound and then 2 punched out satellite lesions below it on the right posterior calf. I was usually able to use Apligraf #1 to cover the full surface area of the larger wound. We continued with Santyl and Hydrofera Blue on the 2 punched-out areas 5/7; patient has his large original wound and 2 punched out satellite lesions below it in the right posterior calf. Apligraf #2 today. Major improvement in the big wound in terms of wound depth. Punched-out [place] wounds have a very healthy looking wound bed. 5/21-Patient comes back for his large right posterior calf wound for which she has been an Apligraf #3 today. Wound depth seems to be better, the punched-out wounds appear to have healthy bed with some bleeding 6/4; right posterior calf wound is much better. Apligraf #4 today. The major wound has no wound depth at all. Only 1 of the satellite lesions is still  open. The patient has very high blood pressure coming in today with a diastolic blood pressure of 130 after lying there for a few minutes it was down to 110. He states that is running [id number] diastolic at home. He was high last week as well I thought he was on 50 mg 1/2 tablet twice daily of losartan I told him to double up on that however it turns out he is actually on 50 twice daily. Course he is run out of his medications prematurely. He has a follow-up with his primary's office next Wednesday. 6/18; patient's wounds continue to improve. The small area laterally is just about closed and there is continued epithelialization on the area on the posterior calf. 7/2; we applied his last Apligraf last visit. Early the next week there was a lot of purulent looking drainage that I cultured this grew staph and Pseudomonas however when we had him back for the next visit to 3 days later everything looked a lot better. He did not receive systemic antibiotics. Fortunately we did not have to remove the Apligraf. He has had heart trouble this week he was having an ablation apparently they have discovered a clot in his left atrium they have changed all his anticoagulants as well as his blood pressure medication. 7/16; using Hydrofera Blue. We are making nice progress towards closure. He has not yet ordered his compression stockings he has his measurements 7/23; dimensions are better. He has a new satellite area superiorly. Both wounds cauterized with silver nitrate. 7/30; absolutely no change in the major wound on the posterior part of the right calf. We have been using Hydrofera Blue for a prolonged period of time and really had a nice improvement but over the last few weeks this is stalled. There is no depth. He arrived in clinic  today with a new area on the right anterior tibia which apparently was an "ingrown hair". It is clearly an open area. This looks like a wrap injury although he was not really  aware of it. 8/31; since the patient was last in clinic he was admitted to hospital from 8/17 through 8/22. He had had multiple falls at home including one off a ladder. He was admitted to hospital with mild COVID-19 viral pneumonia. Noted to be hyperkalemic and in acute renal failure. He has chronic systolic heart failure with ejection fraction of 35%. He arrives back in clinic with the original wound on the posterior aspect of the right lower calf looking a lot better. He has eschared areas from falls and lacerations on his anterior knees bilaterally just below the patella and on the mid part of his tibia bilaterally. He seems to have a small area on the right lateral ankle as well 9/10- Patient comes to clinic with a 2 new wounds one on the left anterior shin and one on the right posterior leg both the left knee and right knee areas are healed up. We have been using Santyl to the left anterior leg and silver alginate to right posterior leg wounds. 9/17; the patient is original wound looks good on the posterior right calf. He has traumatic areas on the left anterior shin left anterior patellar tendon and the right mid tibia area. Except for the right posterior calf all required debridement. We have been using Santyl on these areas and silver alginate posteriorly right calf 9/24; the original wound on the posterior calf on the right looks good. This is healthy. There are traumatic wounds in the left anterior shin, left anterior patella in the right mid tibia area are about the same. He has dusky erythema on the left anterior tibia which led me to give him antibiotics last week. This does not look too much different. This is nontender and I wonder if this is all just venous inflammation. 10/2; the patient has 2 open areas on the right posterior calf both of these look good we have been using silver alginate. He has traumatic wounds on the left and right mid tibia areas. The surface of these wounds  is still not ready for closure. We have been using Iodoflex. Finally has areas on the left knee. The latter wounds are all traumatic after a fall 10/9. His original wounds of the right posterior calf are superficial and progressing towards closure. His traumatic wounds on the left anterior tibia and right anterior tibia are considerably improved in terms of the wound surface. He asked me about a skin graft in these areas I do not think this is going to be necessary. Change from Iodosorb to [facility]. ooThe area on the left knee is just about closed as well 10/16; his original wound on the right posterior calf very superficial looks healthy. He has more recent traumatic wounds on the left anterior tibia and right anterior tibia both of these have better looking surfaces and measuring smaller. Might be beneficial for an advanced treatment product. The area on his left knee has a small superficial scab and we are going to close that when 10/23; his original wound areas on the right posterior calf continue to improve. The more recently traumatic wounds on the left anterior tibia and right anterior tibia both look better in terms of surfaces. No debridement required. We have been using Hydrofera Blue. He is approved for Apligraf 11/3; Apligraf #1 applied  to the left anterior tibia right anterior tibia and to 2 small areas remaining on the right posterior calf which were part of his initial wound area. 11/19; Apligraf #2. Left anterior tibia is healed. Right anterior tibia much better. Only one small area remains on the right posterior calf which was part of his initial wound area 12/3; the left anterior tibia has opened up again. Hyper granulated nodule. Right anterior tibia is superficial and larger. There is no depth of this. I did not place Apligraf today return to [facility] under compression 12/10; the patient has 2 remaining wounds 1 on the right posterior and the other on the left  anterior. Both of these look quite good. No debridement is required we have been using Hydrofera Blue under compression. 12/17; the patient has really not closed over which is disappointing. His wraps were very tight and there may be some wrap injury issues. On the left he has the original wound on the medial mid tibia he has a wrap injury on the lateral mid tibia. On the right his posterior areas are superficial but certainly not closed 12/31; the patient has totally closed on the left. He will transition to his own 20/30 below-knee stocking on the left leg today. Interestingly on the right posterior calf the 2 wounds that he had from last time of closed however there is an additional injury in this same area. Almost looks as though there is subcutaneous fluid/blistering. There is no evidence of cellulitis this does not seem tender 06/24/2019; the patient comes in having been placed in his 20/30 stockings on the left leg last week. He states that he noted blisters break open on Saturday and they had opened into wounds on the anterior tibial area on the left by Monday. The area on the posterior right leg looks satisfactory we are still doing that and compression. We are using calcium alginate to all wound areas 1/14; the patient had juxta light however one of them was not the right size which will need to be returned. The new area last week on the left anterior tibia looks a lot better it measures smaller I would say the area on the right is about the same, this is on the right posterior calf. We have been using silver alginate 1/21; he has his bilateral juxta lites however the left anterior wound continues to look better where is the 2 areas on the right posterior calf look about the same. We have been using Hydrofera Blue under compression with the juxta lite stockings now on standby 2/4; the areas on the left are totally healed and he will be discharged into his own stocking/juxta light. ooOn  the right posterior calf he has what looks to be a blister with a small area of subdermal hemorrhage this is very tiny but I did I am going to wrap him today and see if this will be reabsorbed by next week 2/11; he comes in this week with expanded wounds on the posterior calf as well as a new wound on the left anterior. He said the left anterior started a few days ago with a blister that is now surface. He is using his juxta lite stockings at a pressure of 30/40 2/19; we wrapped his left leg last week after a failed attempt to discharge that leg and his juxta lites 2 weeks ago. The leg is again healed he has superficial areas posteriorly on the right leg Objective Constitutional Patient is hypertensive.. Pulse regular and  within target range for patient.[name] Respirations regular, non-labored and within target range.. Temperature is normal and within the target range for the patient.[name] Appears in no distress. Vitals Time Taken: 2:27 PM, Height: 73 in, Weight: 290 lbs, BMI: 38.3, Temperature: 97.9 F, Pulse: 51 bpm, Respiratory Rate: 18 breaths/min, Blood Pressure: 144/80 mmHg. Cardiovascular Pedal pulses palpable and strong bilaterally.. We have good edema control bilaterally. General Notes: Wound exam ooStill an area on the right posterior calf 2 separate areas. No blisters. ooThere is nothing open on the left leg Integumentary (Hair, Skin) Changes of chronic venous insufficiency. Wound #6 status is Open. Original cause of wound was Gradually Appeared. The wound is located on the Right,Proximal,Posterior Lower Leg. The wound measures 0.5cm length x 0.3cm width x 0.1cm depth; 0.118cm^2 area and 0.012cm^3 volume. There is Fat Layer (Subcutaneous Tissue) Exposed exposed. There is no tunneling or undermining noted. There is a small amount of serosanguineous drainage noted. The wound margin is flat and intact. There is large (67-100%) pink granulation within the wound bed. There is no necrotic  tissue within the wound bed. Wound #8RR status is Healed - Epithelialized. Original cause of wound was Gradually Appeared. The wound is located on the Left,Anterior Lower Leg. The wound measures 0cm length x 0cm width x 0cm depth; 0cm^2 area and 0cm^3 volume. There is no tunneling or undermining noted. There is a none present amount of drainage noted. The wound margin is flat and intact. There is no granulation within the wound bed. There is no necrotic tissue within the wound bed. Assessment Active Problems ICD-10 Non-pressure chronic ulcer of right calf with fat layer exposed Type 2 diabetes mellitus with other skin ulcer Chronic venous hypertension (idiopathic) with inflammation of right lower extremity Laceration without foreign body, right lower leg, subsequent encounter Non-pressure chronic ulcer of other part of left lower leg with fat layer exposed Procedures Wound #6 Pre-procedure diagnosis of Wound #6 is a Venous Leg Ulcer located on the Right,Proximal,Posterior Lower Leg . There was a Three Layer Compression Therapy Procedure by Deon Pilling, RN. Post procedure Diagnosis Wound #6: Same as Pre-Procedure Plan Follow-up Appointments: Return Appointment in 1 week. Dressing Change Frequency: Do not change entire dressing for one week. Skin Barriers/Peri-Wound Care: Moisturizing lotion Wound Cleansing: May shower with protection. Primary Wound Dressing: Wound #6 Right,Proximal,Posterior Lower Leg: Polymem Secondary Dressing: Wound #6 Right,Proximal,Posterior Lower Leg: Dry Gauze Edema Control: 3 Layer Compression System - Right Lower Extremity Avoid standing for long periods of time Elevate legs to the level of the heart or above for 30 minutes daily and/or when sitting, a frequency of: - 3-4 times a day. Exercise regularly Support Garment 30-40 mm/Hg pressure to: - Juxtalite to left lower leg #1 on the right leg polymen under 3 layer compression 2. We will try to  discharge him to his juxta lite stocking again on the left. 3. The only thing beyond this I can think of his external compression pumps and he does not seem to carry the necessary amount of edema although that is not always a prerequisite Electronic Signature(s) Signed: 08/05/2019 6:29:59 PM By: Linton Ham MD Entered By: Linton Ham on 08/05/2019 15:31:24 -------------------------------------------------------------------------------- SuperBill Details Patient Name: Date of Service: [name] 08/05/2019 Medical Record [id number] Patient Account Number: [id number] Date of Birth/Sex: Treating RN: October 03, 1955 (64 y.o. [name] Primary Care Provider: Pricilla [name] Other Clinician: Referring Provider: Treating Provider/Extender:Merrill Villarruel, [name], [name] in Treatment: 53 Diagnosis Coding ICD-10 Codes Code Description [phone] Non-pressure chronic  ulcer of right calf with fat layer exposed E11.622 Type 2 diabetes mellitus with other skin ulcer I87.321 Chronic venous hypertension (idiopathic) with inflammation of right lower extremity S81.811D Laceration without foreign body, right lower leg, subsequent encounter L97.822 Non-pressure chronic ulcer of other part of left lower leg with fat layer exposed Facility Procedures CPT4 Code Description: [id number] (Facility Use Only) [place] RT LEG Modifier: 1 Quantity: Physician Procedures CPT4 Code Description: [id number] - WC PHYS LEVEL 3 - EST PT ICD-10 Diagnosis Description L97.212 Non-pressure chronic ulcer of right calf with fat laye E11.622 Type 2 diabetes mellitus with other skin ulcer I87.321 Chronic venous hypertension  (idiopathic) with inflamma extremity Modifier: r exposed tion of right lo Quantity: 1 wer Electronic Signature(s) Signed: 08/05/2019 6:29:59 PM By: Linton Ham MD Entered By: Linton Ham on 08/05/2019 15:31:50

## 2019-08-12 ENCOUNTER — Encounter (HOSPITAL_BASED_OUTPATIENT_CLINIC_OR_DEPARTMENT_OTHER): Payer: BC Managed Care – PPO | Admitting: Internal Medicine

## 2019-08-12 ENCOUNTER — Other Ambulatory Visit: Payer: Self-pay

## 2019-08-12 DIAGNOSIS — I87311 Chronic venous hypertension (idiopathic) with ulcer of right lower extremity: Secondary | ICD-10-CM | POA: Diagnosis not present

## 2019-08-12 DIAGNOSIS — Z7901 Long term (current) use of anticoagulants: Secondary | ICD-10-CM | POA: Diagnosis not present

## 2019-08-12 DIAGNOSIS — E11622 Type 2 diabetes mellitus with other skin ulcer: Secondary | ICD-10-CM | POA: Diagnosis not present

## 2019-08-12 DIAGNOSIS — I110 Hypertensive heart disease with heart failure: Secondary | ICD-10-CM | POA: Diagnosis not present

## 2019-08-12 DIAGNOSIS — E875 Hyperkalemia: Secondary | ICD-10-CM | POA: Diagnosis not present

## 2019-08-12 DIAGNOSIS — I4891 Unspecified atrial fibrillation: Secondary | ICD-10-CM | POA: Diagnosis not present

## 2019-08-12 DIAGNOSIS — L97822 Non-pressure chronic ulcer of other part of left lower leg with fat layer exposed: Secondary | ICD-10-CM | POA: Diagnosis not present

## 2019-08-12 DIAGNOSIS — I890 Lymphedema, not elsewhere classified: Secondary | ICD-10-CM | POA: Diagnosis not present

## 2019-08-12 DIAGNOSIS — I429 Cardiomyopathy, unspecified: Secondary | ICD-10-CM | POA: Diagnosis not present

## 2019-08-12 DIAGNOSIS — I872 Venous insufficiency (chronic) (peripheral): Secondary | ICD-10-CM | POA: Diagnosis not present

## 2019-08-12 DIAGNOSIS — S81801A Unspecified open wound, right lower leg, initial encounter: Secondary | ICD-10-CM | POA: Diagnosis not present

## 2019-08-12 DIAGNOSIS — L97212 Non-pressure chronic ulcer of right calf with fat layer exposed: Secondary | ICD-10-CM | POA: Diagnosis not present

## 2019-08-12 DIAGNOSIS — J45909 Unspecified asthma, uncomplicated: Secondary | ICD-10-CM | POA: Diagnosis not present

## 2019-08-12 DIAGNOSIS — Z8616 Personal history of COVID-19: Secondary | ICD-10-CM | POA: Diagnosis not present

## 2019-08-12 DIAGNOSIS — I5022 Chronic systolic (congestive) heart failure: Secondary | ICD-10-CM | POA: Diagnosis not present

## 2019-08-12 DIAGNOSIS — G4730 Sleep apnea, unspecified: Secondary | ICD-10-CM | POA: Diagnosis not present

## 2019-08-12 NOTE — Progress Notes (Signed)
[name], MONTOUR ([name]) Visit Report for 08/12/2019 HPI Details Patient Name: Date of Service: [name], [name] 08/12/2019 2:15 PM Medical Record [name] Patient Account Number: [id number] Date of Birth/Sex: Treating RN: 13-Mar-1956 (64 y.o. [name] Primary Care Provider: Pricilla [name] Other Clinician: Referring Provider: Treating Provider/Extender:[name], [name], [name] in Treatment: 29 History of Present Illness HPI Description: ADMISSION 07/29/2018 [name] is a 64 year old man with either prediabetes or diabetes he is on glipizide. In late October to November 2019 he noted a scabbed area on the back of his right calf. He picked this off a few times but it would not heal. He saw his primary physician on 12/5. It was felt at that time that this may actually heal on its own with conservative management. The next visit was on 07/20/2018 noted the area was a lot worse and arranged for his treatment here. He has been topical antibiotics like Neosporin although he stopped using this when the wound looked worse. He is just been covering this with a clean Band-Aid. He is given Bactrim a week ago and he is finishing this currently. It is made some improvement in the surrounding erythema per the patient. He does not have a history of nonhealing wounds. No prior history of wounds on his legs that he had difficulty healing. No prior skin issues. 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 Past medical history; A. fib status post ablation and recent cardioversion he is on Xarelto. He has a history of systolic heart failure, cervical radiculopathy, cardiomyopathy and type 2 diabetes as discussed ABI in the right leg was noncompressible on the right 2/20; the biopsy I did on the patient last week was negative for malignancy. Culture grew Pseudomonas and methicillin sensitive staph aureus. He is on cefdinir 300 twice a day. I will have to  make this a 10 day course. He put him in compression. He states that the redness pain and erythema are a lot better. I suspect the patient has chronic venous insufficiency probably with a secondary cellulitis 2/27; arrives today with copious amounts of drainage coming out of the wound irritating the skin below the wound area. He only renewed the final 3 days of cefdinir today 3/5; came in for a nurse change 3 days ago. Again a lot of drainage noted. Zinc oxide was applied unfortunately the drainage appears to a pool then he has a string of superficial open areas extending down into the Achilles area and some just below the wound. The wound itself does not look too bad. He is completed the antibiotics although apparently there was separation from the original 7 with the last 3 days. Culture grew a few MSSA and a few Pseudomonas 3/12; not too much difference over the last week. He came in for a dressing change on Monday by our nursing staff. Necrotic debris again over the wound surface. The entire area looks irritated but nontender and I do not think shows obvious evidence of infection. We have not heard anything about the reflux studies. Also notable than in this diabetic man we had noncompressible vessels and although I can feel his pulses easily in his feet I will order arterial studies as well. 3/19-Patient had experience more pain has been dressed with a silver alginate, the wounds all have necrotic debris, very friable with easy bleeding with any kind of surface debridement including with gauze and Anasept and with a #3 curette. His vascular appointments unfortunately were all canceled on  account of the virus outbreak resulting in studies only being done for emergent cases. His pulses are easily palpable in the lower extremity. He has open areas below the primary wound where he states the drainage which included purulent material also with some blood pooled and caused breakdown. 3/26; the  patient was changed to Santyl with [facility] backing last week. This was largely because the silver alginate was sticking to the wound. He is still having quite a bit of pain. He tells [place] that the reflux studies and arterial studies we had attempted to arrange through vein and vascular are not being booked until mid May. Culture that was done last week showed both Serratia moderate and a few methicillin sensitive staph aureus I started him on Bactrim DS 1 p.o. twice daily on 3/23 for 7 days. He is here for follow-up. 4/3; patient is on Santyl with Hydrofera Blue. Patient complains of pain. Our intake nurse is noted drainage and some odor. He has completed Bactrim recently for Serratia and a few methicillin sensitive staph aureus. After our staff push hard last week we were able to get venous studies as well as arterial studies. His arterial studies showed on the right great toe pressure of 0.86. On the left ABI at 1.47 TBI of 0.87. On both sides waveforms were triphasic VENOUS STUDIES showed reflux in the femoral vein, popliteal vein great saphenous vein at the saphenofemoral junction and great saphenous vein at the proximal thigh. Also noted to have age-indeterminate superficial vein thrombosis involving the small saphenous vein. Notable that the patient is already on Xarelto for atrial fibrillation. 4/9; the patient has been to see vascular surgery and reviewed by Dr. [name]. He was felt to have only minimal superficial venous reflux in the right great greater saphenous vein. For this reason he was not felt to be a candidate for laser ablation of the right greater saphenous vein. A wedge cushion was suggested to keep his leg elevated. He does have deep venous reflux on the right. Culture I did last time showed a combination of Serratia Pseudomonas and methicillin sensitive staph aureus. We have been using Hydrofera Blue to the large wound, Santyl to some of the deep punched out satellite  lesions distal to it. There is some improvement [id number]; the application for Apligraf was put out for further review for material that we have resubmitted. He has the deep area on the right posterior calf which is large and then 3 small punched out areas beneath this. In general his wounds look a lot better 4/23; the patient has his large original wound and then 2 punched out satellite lesions below it on the right posterior calf. I was usually able to use Apligraf #1 to cover the full surface area of the larger wound. We continued with Santyl and Hydrofera Blue on the 2 punched-out areas 5/7; patient has his large original wound and 2 punched out satellite lesions below it in the right posterior calf. Apligraf #2 today. Major improvement in the big wound in terms of wound depth. Punched-out [place] wounds have a very healthy looking wound bed. 5/21-Patient comes back for his large right posterior calf wound for which she has been an Apligraf #3 today. Wound depth seems to be better, the punched-out wounds appear to have healthy bed with some bleeding 6/4; right posterior calf wound is much better. Apligraf #4 today. The major wound has no wound depth at all. Only 1 of the satellite lesions is still open. The patient  has very high blood pressure coming in today with a diastolic blood pressure of 130 after lying there for a few minutes it was down to 110. He states that is running [id number] diastolic at home. He was high last week as well I thought he was on 50 mg 1/2 tablet twice daily of losartan I told him to double up on that however it turns out he is actually on 50 twice daily. Course he is run out of his medications prematurely. He has a follow-up with his primary's office next Wednesday. 6/18; patient's wounds continue to improve. The small area laterally is just about closed and there is continued epithelialization on the area on the posterior calf. 7/2; we applied his last Apligraf last  visit. Early the next week there was a lot of purulent looking drainage that I cultured this grew staph and Pseudomonas however when we had him back for the next visit to 3 days later everything looked a lot better. He did not receive systemic antibiotics. Fortunately we did not have to remove the Apligraf. He has had heart trouble this week he was having an ablation apparently they have discovered a clot in his left atrium they have changed all his anticoagulants as well as his blood pressure medication. 7/16; using Hydrofera Blue. We are making nice progress towards closure. He has not yet ordered his compression stockings he has his measurements 7/23; dimensions are better. He has a new satellite area superiorly. Both wounds cauterized with silver nitrate. 7/30; absolutely no change in the major wound on the posterior part of the right calf. We have been using Hydrofera Blue for a prolonged period of time and really had a nice improvement but over the last few weeks this is stalled. There is no depth. He arrived in clinic today with a new area on the right anterior tibia which apparently was an "ingrown hair". It is clearly an open area. This looks like a wrap injury although he was not really aware of it. 8/31; since the patient was last in clinic he was admitted to hospital from 8/17 through 8/22. He had had multiple falls at home including one off a ladder. He was admitted to hospital with mild COVID-19 viral pneumonia. Noted to be hyperkalemic and in acute renal failure. He has chronic systolic heart failure with ejection fraction of 35%. He arrives back in clinic with the original wound on the posterior aspect of the right lower calf looking a lot better. He has eschared areas from falls and lacerations on his anterior knees bilaterally just below the patella and on the mid part of his tibia bilaterally. He seems to have a small area on the right lateral ankle as well 9/10- Patient comes  to clinic with a 2 new wounds one on the left anterior shin and one on the right posterior leg both the left knee and right knee areas are healed up. We have been using Santyl to the left anterior leg and silver alginate to right posterior leg wounds. 9/17; the patient is original wound looks good on the posterior right calf. He has traumatic areas on the left anterior shin left anterior patellar tendon and the right mid tibia area. Except for the right posterior calf all required debridement. We have been using Santyl on these areas and silver alginate posteriorly right calf 9/24; the original wound on the posterior calf on the right looks good. This is healthy. There are traumatic wounds in the left anterior shin,  left anterior patella in the right mid tibia area are about the same. He has dusky erythema on the left anterior tibia which led me to give him antibiotics last week. This does not look too much different. This is nontender and I wonder if this is all just venous inflammation. 10/2; the patient has 2 open areas on the right posterior calf both of these look good we have been using silver alginate. He has traumatic wounds on the left and right mid tibia areas. The surface of these wounds is still not ready for closure. We have been using Iodoflex. Finally has areas on the left knee. The latter wounds are all traumatic after a fall 10/9. His original wounds of the right posterior calf are superficial and progressing towards closure. His traumatic wounds on the left anterior tibia and right anterior tibia are considerably improved in terms of the wound surface. He asked me about a skin graft in these areas I do not think this is going to be necessary. Change from Iodosorb to [facility]. The area on the left knee is just about closed as well 10/16; his original wound on the right posterior calf very superficial looks healthy. He has more recent traumatic wounds on the left anterior  tibia and right anterior tibia both of these have better looking surfaces and measuring smaller. Might be beneficial for an advanced treatment product. The area on his left knee has a small superficial scab and we are going to close that when 10/23; his original wound areas on the right posterior calf continue to improve. The more recently traumatic wounds on the left anterior tibia and right anterior tibia both look better in terms of surfaces. No debridement required. We have been using Hydrofera Blue. He is approved for Apligraf 11/3; Apligraf #1 applied to the left anterior tibia right anterior tibia and to 2 small areas remaining on the right posterior calf which were part of his initial wound area. 11/19; Apligraf #2. Left anterior tibia is healed. Right anterior tibia much better. Only one small area remains on the right posterior calf which was part of his initial wound area 12/3; the left anterior tibia has opened up again. Hyper granulated nodule. Right anterior tibia is superficial and larger. There is no depth of this. I did not place Apligraf today return to [facility] under compression 12/10; the patient has 2 remaining wounds 1 on the right posterior and the other on the left anterior. Both of these look quite good. No debridement is required we have been using Hydrofera Blue under compression. 12/17; the patient has really not closed over which is disappointing. His wraps were very tight and there may be some wrap injury issues. On the left he has the original wound on the medial mid tibia he has a wrap injury on the lateral mid tibia. On the right his posterior areas are superficial but certainly not closed 12/31; the patient has totally closed on the left. He will transition to his own 20/30 below-knee stocking on the left leg today. Interestingly on the right posterior calf the 2 wounds that he had from last time of closed however there is an additional injury in this same  area. Almost looks as though there is subcutaneous fluid/blistering. There is no evidence of cellulitis this does not seem tender 06/24/2019; the patient comes in having been placed in his 20/30 stockings on the left leg last week. He states that he noted blisters break open on Saturday and  they had opened into wounds on the anterior tibial area on the left by Monday. The area on the posterior right leg looks satisfactory we are still doing that and compression. We are using calcium alginate to all wound areas 1/14; the patient had juxta light however one of them was not the right size which will need to be returned. The new area last week on the left anterior tibia looks a lot better it measures smaller I would say the area on the right is about the same, this is on the right posterior calf. We have been using silver alginate 1/21; he has his bilateral juxta lites however the left anterior wound continues to look better where is the 2 areas on the right posterior calf look about the same. We have been using Hydrofera Blue under compression with the juxta lite stockings now on standby 2/4; the areas on the left are totally healed and he will be discharged into his own stocking/juxta light. On the right posterior calf he has what looks to be a blister with a small area of subdermal hemorrhage this is very tiny but I did I am going to wrap him today and see if this will be reabsorbed by next week 2/11; he comes in this week with expanded wounds on the posterior calf as well as a new wound on the left anterior. He said the left anterior started a few days ago with a blister that is now surface. He is using his juxta lite stockings at a pressure of 30/40 2/19; we wrapped his left leg last week after a failed attempt to discharge that leg and his juxta lites 2 weeks ago. The leg is again healed he has superficial areas posteriorly on the right leg 2/26; the patient came in initially with right posterior  calf wound secondary to minor trauma with secondary infection. He went on to develop a left lower leg wound. In the interim he had congestive heart failure with acute renal failure a fall with bilateral anterior leg wounds and finally Covid requiring admission to pneumonia with pneumonia. He is finally closed. He has external compression stockings bilaterally [juxta lite stockings]. His wounds are healed Electronic Signature(s) Signed: 08/12/2019 5:26:28 PM By: Linton Ham MD Entered By: Linton Ham on 08/12/2019 15:30:17 -------------------------------------------------------------------------------- Physical Exam Details Patient Name: Date of Service: [name] 08/12/2019 2:15 PM Medical Record [id number] Patient Account Number: [id number] Date of Birth/Sex: Treating RN: January 15, 1956 (64 y.o. [name] Primary Care Provider: Pricilla [name] Other Clinician: Referring Provider: Treating Provider/Extender:Curlie Macken, [name], [name] in Treatment: 54 Constitutional Sitting or standing Blood Pressure is within target range for patient.. Pulse regular and within target range for patient.[name] Respirations regular, non-labored and within target range.. Temperature is normal and within the target range for the patient.[name] Appears in no distress. Notes Wound exam The final area is the right posterior calf where his wound initially was. There is nothing open on the right leg The patient has chronic venous insufficiency with secondary lymphedema this is controlled. He has external compression stockings as well as ordinary stockings He also had traumatic areas on the left and right anterior legs during his stay here. Complicated hospitalizations Electronic Signature(s) Signed: 08/12/2019 5:26:28 PM By: Linton Ham MD Entered By: Linton Ham on 08/12/2019  15:31:24 -------------------------------------------------------------------------------- Physician Orders Details Patient Name: Date of Service: [name] 08/12/2019 2:15 PM Medical Record [id number] Patient Account Number: [id number] Date of Birth/Sex: Treating RN: [birth year] (64 y.o. Gay Filler, Larene Beach  Primary Care Provider: Pricilla [name] Other Clinician: Referring Provider: Treating Provider/Extender:Jissell Trafton, [name], [name] in Treatment: 12 Verbal / Phone Orders: No Diagnosis Coding ICD-10 Coding Code Description [phone] Non-pressure chronic ulcer of right calf with fat layer exposed E11.622 Type 2 diabetes mellitus with other skin ulcer I87.321 Chronic venous hypertension (idiopathic) with inflammation of right lower extremity S81.811D Laceration without foreign body, right lower leg, subsequent encounter L97.822 Non-pressure chronic ulcer of other part of left lower leg with fat layer exposed Discharge From [place] Discharge from [place] - call if wound re-opens. CONGRATULATIONS!!!!!!! =) Edema Control Other: - Wear juxtalites bilateral daily, apply in the morning and remove at night Electronic Signature(s) Signed: 08/12/2019 5:17:01 PM By: Kela Millin Signed: 08/12/2019 5:26:28 PM By: Linton Ham MD Entered By: Kela Millin on 08/12/2019 15:11:26 -------------------------------------------------------------------------------- Problem List Details Patient Name: Date of Service: [name] 08/12/2019 2:15 PM Medical Record [id number] Patient Account Number: [id number] Date of Birth/Sex: Treating RN: 28-Apr-1956 (64 y.o. [name] Primary Care Provider: Pricilla [name] Other Clinician: Referring Provider: Treating Provider/Extender:Geneal Huebert, [name], [name] in Treatment: 54 Active Problems ICD-10 Evaluated Encounter Code Description Active Date Today  Diagnosis L97.212 Non-pressure chronic ulcer of right calf with fat layer 07/29/2018 No Yes exposed E11.622 Type 2 diabetes mellitus with other skin ulcer 07/29/2018 No Yes I87.321 Chronic venous hypertension (idiopathic) with 07/29/2018 No Yes inflammation of right lower extremity S81.811D Laceration without foreign body, right lower leg, 02/14/2019 No Yes subsequent encounter L97.822 Non-pressure chronic ulcer of other part of left lower 06/30/2019 No Yes leg with fat layer exposed Inactive Problems ICD-10 Code Description Active Date Inactive Date S81.812D Laceration without foreign body, left lower leg, subsequent 02/14/2019 02/14/2019 encounter L03.115 Cellulitis of right lower limb 09/24/2018 09/24/2018 Resolved Problems Electronic Signature(s) Signed: 08/12/2019 5:26:28 PM By: Linton Ham MD Entered By: Linton Ham on 08/12/2019 15:28:43 -------------------------------------------------------------------------------- Progress Note Details Patient Name: Date of Service: [name] 08/12/2019 2:15 PM Medical Record [id number] Patient Account Number: [id number] Date of Birth/Sex: Treating RN: [birth year] (64 y.o. [name] Primary Care Provider: Pricilla [name] Other Clinician: Referring Provider: Treating Provider/Extender:Khayman Kirsch, [name], [name] in Treatment: 54 Subjective History of Present Illness (HPI) ADMISSION 07/29/2018 Mr. Stupar is a 64 year old man with either prediabetes or diabetes he is on glipizide. In late October to November 2019 he noted a scabbed area on the back of his right calf. He picked this off a few times but it would not heal. He saw his primary physician on 12/5. It was felt at that time that this may actually heal on its own with conservative management. The next visit was on 07/20/2018 noted the area was a lot worse and arranged for his treatment here. He has been topical antibiotics like Neosporin  although he stopped using this when the wound looked worse. He is just been covering this with a clean Band-Aid. He is given Bactrim a week ago and he is finishing this currently. It is made some improvement in the surrounding erythema per the patient. He does not have a history of nonhealing wounds. No prior history of wounds on his legs that he had difficulty healing. No prior skin issues. 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 Past medical history; A. fib status post ablation and recent cardioversion he is on Xarelto. He has a history of systolic heart failure, cervical radiculopathy, cardiomyopathy and type 2 diabetes as discussed ABI in the right leg was noncompressible on  the right 2/20; the biopsy I did on the patient last week was negative for malignancy. Culture grew Pseudomonas and methicillin sensitive staph aureus. He is on cefdinir 300 twice a day. I will have to make this a 10 day course. He put him in compression. He states that the redness pain and erythema are a lot better. I suspect the patient has chronic venous insufficiency probably with a secondary cellulitis 2/27; arrives today with copious amounts of drainage coming out of the wound irritating the skin below the wound area. He only renewed the final 3 days of cefdinir today 3/5; came in for a nurse change 3 days ago. Again a lot of drainage noted. Zinc oxide was applied unfortunately the drainage appears to a pool then he has a string of superficial open areas extending down into the Achilles area and some just below the wound. The wound itself does not look too bad. He is completed the antibiotics although apparently there was separation from the original 7 with the last 3 days. Culture grew a few MSSA and a few Pseudomonas 3/12; not too much difference over the last week. He came in for a dressing change on Monday by our nursing staff. Necrotic debris again over the wound surface. The entire area looks  irritated but nontender and I do not think shows obvious evidence of infection. We have not heard anything about the reflux studies. Also notable than in this diabetic man we had noncompressible vessels and although I can feel his pulses easily in his feet I will order arterial studies as well. 3/19-Patient had experience more pain has been dressed with a silver alginate, the wounds all have necrotic debris, very friable with easy bleeding with any kind of surface debridement including with gauze and Anasept and with a #3 curette. His vascular appointments unfortunately were all canceled on account of the virus outbreak resulting in studies only being done for emergent cases. His pulses are easily palpable in the lower extremity. He has open areas below the primary wound where he states the drainage which included purulent material also with some blood pooled and caused breakdown. 3/26; the patient was changed to Santyl with [facility] backing last week. This was largely because the silver alginate was sticking to the wound. He is still having quite a bit of pain. He tells [place] that the reflux studies and arterial studies we had attempted to arrange through vein and vascular are not being booked until mid May. Culture that was done last week showed both Serratia moderate and a few methicillin sensitive staph aureus I started him on Bactrim DS 1 p.o. twice daily on 3/23 for 7 days. He is here for follow-up. 4/3; patient is on Santyl with Hydrofera Blue. Patient complains of pain. Our intake nurse is noted drainage and some odor. He has completed Bactrim recently for Serratia and a few methicillin sensitive staph aureus. After our staff push hard last week we were able to get venous studies as well as arterial studies. His arterial studies showed on the right great toe pressure of 0.86. On the left ABI at 1.47 TBI of 0.87. On both sides waveforms were triphasic VENOUS STUDIES showed reflux in the  femoral vein, popliteal vein great saphenous vein at the saphenofemoral junction and great saphenous vein at the proximal thigh. Also noted to have age-indeterminate superficial vein thrombosis involving the small saphenous vein. Notable that the patient is already on Xarelto for atrial fibrillation. 4/9; the patient has been to  see vascular surgery and reviewed by Dr. [name]. He was felt to have only minimal superficial venous reflux in the right great greater saphenous vein. For this reason he was not felt to be a candidate for laser ablation of the right greater saphenous vein. A wedge cushion was suggested to keep his leg elevated. He does have deep venous reflux on the right. Culture I did last time showed a combination of Serratia Pseudomonas and methicillin sensitive staph aureus. We have been using Hydrofera Blue to the large wound, Santyl to some of the deep punched out satellite lesions distal to it. There is some improvement [id number]; the application for Apligraf was put out for further review for material that we have resubmitted. He has the deep area on the right posterior calf which is large and then 3 small punched out areas beneath this. In general his wounds look a lot better 4/23; the patient has his large original wound and then 2 punched out satellite lesions below it on the right posterior calf. I was usually able to use Apligraf #1 to cover the full surface area of the larger wound. We continued with Santyl and Hydrofera Blue on the 2 punched-out areas 5/7; patient has his large original wound and 2 punched out satellite lesions below it in the right posterior calf. Apligraf #2 today. Major improvement in the big wound in terms of wound depth. Punched-out [place] wounds have a very healthy looking wound bed. 5/21-Patient comes back for his large right posterior calf wound for which she has been an Apligraf #3 today. Wound depth seems to be better, the punched-out wounds appear to  have healthy bed with some bleeding 6/4; right posterior calf wound is much better. Apligraf #4 today. The major wound has no wound depth at all. Only 1 of the satellite lesions is still open. The patient has very high blood pressure coming in today with a diastolic blood pressure of 130 after lying there for a few minutes it was down to 110. He states that is running [id number] diastolic at home. He was high last week as well I thought he was on 50 mg 1/2 tablet twice daily of losartan I told him to double up on that however it turns out he is actually on 50 twice daily. Course he is run out of his medications prematurely. He has a follow-up with his primary's office next Wednesday. 6/18; patient's wounds continue to improve. The small area laterally is just about closed and there is continued epithelialization on the area on the posterior calf. 7/2; we applied his last Apligraf last visit. Early the next week there was a lot of purulent looking drainage that I cultured this grew staph and Pseudomonas however when we had him back for the next visit to 3 days later everything looked a lot better. He did not receive systemic antibiotics. Fortunately we did not have to remove the Apligraf. He has had heart trouble this week he was having an ablation apparently they have discovered a clot in his left atrium they have changed all his anticoagulants as well as his blood pressure medication. 7/16; using Hydrofera Blue. We are making nice progress towards closure. He has not yet ordered his compression stockings he has his measurements 7/23; dimensions are better. He has a new satellite area superiorly. Both wounds cauterized with silver nitrate. 7/30; absolutely no change in the major wound on the posterior part of the right calf. We have been using Hydrofera Blue for a  prolonged period of time and really had a nice improvement but over the last few weeks this is stalled. There is no depth. He arrived  in clinic today with a new area on the right anterior tibia which apparently was an "ingrown hair". It is clearly an open area. This looks like a wrap injury although he was not really aware of it. 8/31; since the patient was last in clinic he was admitted to hospital from 8/17 through 8/22. He had had multiple falls at home including one off a ladder. He was admitted to hospital with mild COVID-19 viral pneumonia. Noted to be hyperkalemic and in acute renal failure. He has chronic systolic heart failure with ejection fraction of 35%. He arrives back in clinic with the original wound on the posterior aspect of the right lower calf looking a lot better. He has eschared areas from falls and lacerations on his anterior knees bilaterally just below the patella and on the mid part of his tibia bilaterally. He seems to have a small area on the right lateral ankle as well 9/10- Patient comes to clinic with a 2 new wounds one on the left anterior shin and one on the right posterior leg both the left knee and right knee areas are healed up. We have been using Santyl to the left anterior leg and silver alginate to right posterior leg wounds. 9/17; the patient is original wound looks good on the posterior right calf. He has traumatic areas on the left anterior shin left anterior patellar tendon and the right mid tibia area. Except for the right posterior calf all required debridement. We have been using Santyl on these areas and silver alginate posteriorly right calf 9/24; the original wound on the posterior calf on the right looks good. This is healthy. There are traumatic wounds in the left anterior shin, left anterior patella in the right mid tibia area are about the same. He has dusky erythema on the left anterior tibia which led me to give him antibiotics last week. This does not look too much different. This is nontender and I wonder if this is all just venous inflammation. 10/2; the patient has 2 open  areas on the right posterior calf both of these look good we have been using silver alginate. He has traumatic wounds on the left and right mid tibia areas. The surface of these wounds is still not ready for closure. We have been using Iodoflex. Finally has areas on the left knee. The latter wounds are all traumatic after a fall 10/9. His original wounds of the right posterior calf are superficial and progressing towards closure. His traumatic wounds on the left anterior tibia and right anterior tibia are considerably improved in terms of the wound surface. He asked me about a skin graft in these areas I do not think this is going to be necessary. Change from Iodosorb to [facility]. ooThe area on the left knee is just about closed as well 10/16; his original wound on the right posterior calf very superficial looks healthy. He has more recent traumatic wounds on the left anterior tibia and right anterior tibia both of these have better looking surfaces and measuring smaller. Might be beneficial for an advanced treatment product. The area on his left knee has a small superficial scab and we are going to close that when 10/23; his original wound areas on the right posterior calf continue to improve. The more recently traumatic wounds on the left anterior tibia and right  anterior tibia both look better in terms of surfaces. No debridement required. We have been using Hydrofera Blue. He is approved for Apligraf 11/3; Apligraf #1 applied to the left anterior tibia right anterior tibia and to 2 small areas remaining on the right posterior calf which were part of his initial wound area. 11/19; Apligraf #2. Left anterior tibia is healed. Right anterior tibia much better. Only one small area remains on the right posterior calf which was part of his initial wound area 12/3; the left anterior tibia has opened up again. Hyper granulated nodule. Right anterior tibia is superficial and larger. There is no  depth of this. I did not place Apligraf today return to [facility] under compression 12/10; the patient has 2 remaining wounds 1 on the right posterior and the other on the left anterior. Both of these look quite good. No debridement is required we have been using Hydrofera Blue under compression. 12/17; the patient has really not closed over which is disappointing. His wraps were very tight and there may be some wrap injury issues. On the left he has the original wound on the medial mid tibia he has a wrap injury on the lateral mid tibia. On the right his posterior areas are superficial but certainly not closed 12/31; the patient has totally closed on the left. He will transition to his own 20/30 below-knee stocking on the left leg today. Interestingly on the right posterior calf the 2 wounds that he had from last time of closed however there is an additional injury in this same area. Almost looks as though there is subcutaneous fluid/blistering. There is no evidence of cellulitis this does not seem tender 06/24/2019; the patient comes in having been placed in his 20/30 stockings on the left leg last week. He states that he noted blisters break open on Saturday and they had opened into wounds on the anterior tibial area on the left by Monday. The area on the posterior right leg looks satisfactory we are still doing that and compression. We are using calcium alginate to all wound areas 1/14; the patient had juxta light however one of them was not the right size which will need to be returned. The new area last week on the left anterior tibia looks a lot better it measures smaller I would say the area on the right is about the same, this is on the right posterior calf. We have been using silver alginate 1/21; he has his bilateral juxta lites however the left anterior wound continues to look better where is the 2 areas on the right posterior calf look about the same. We have been using Hydrofera  Blue under compression with the juxta lite stockings now on standby 2/4; the areas on the left are totally healed and he will be discharged into his own stocking/juxta light. ooOn the right posterior calf he has what looks to be a blister with a small area of subdermal hemorrhage this is very tiny but I did I am going to wrap him today and see if this will be reabsorbed by next week 2/11; he comes in this week with expanded wounds on the posterior calf as well as a new wound on the left anterior. He said the left anterior started a few days ago with a blister that is now surface. He is using his juxta lite stockings at a pressure of 30/40 2/19; we wrapped his left leg last week after a failed attempt to discharge that leg and his  juxta lites 2 weeks ago. The leg is again healed he has superficial areas posteriorly on the right leg 2/26; the patient came in initially with right posterior calf wound secondary to minor trauma with secondary infection. He went on to develop a left lower leg wound. In the interim he had congestive heart failure with acute renal failure a fall with bilateral anterior leg wounds and finally Covid requiring admission to pneumonia with pneumonia. He is finally closed. He has external compression stockings bilaterally [juxta lite stockings]. His wounds are healed Objective Constitutional Sitting or standing Blood Pressure is within target range for patient.. Pulse regular and within target range for patient.[name] Respirations regular, non-labored and within target range.. Temperature is normal and within the target range for the patient.[name] Appears in no distress. Vitals Time Taken: 2:32 PM, Height: 73 in, Weight: 290 lbs, BMI: 38.3, Temperature: 98.2 F, Pulse: 47 bpm, Respiratory Rate: 18 breaths/min, Blood Pressure: 139/66 mmHg. General Notes: Wound exam ooThe final area is the right posterior calf where his wound initially was. There is nothing open on the right leg  ooThe patient has chronic venous insufficiency with secondary lymphedema this is controlled. He has external compression stockings as well as ordinary stockings ooHe also had traumatic areas on the left and right anterior legs during his stay here. Complicated hospitalizations Integumentary (Hair, Skin) Wound #6 status is Healed - Epithelialized. Original cause of wound was Gradually Appeared. The wound is located on the Right,Proximal,Posterior Lower Leg. The wound measures 0cm length x 0cm width x 0cm depth; 0cm^2 area and 0cm^3 volume. Assessment Active Problems ICD-10 Non-pressure chronic ulcer of right calf with fat layer exposed Type 2 diabetes mellitus with other skin ulcer Chronic venous hypertension (idiopathic) with inflammation of right lower extremity Laceration without foreign body, right lower leg, subsequent encounter Non-pressure chronic ulcer of other part of left lower leg with fat layer exposed Plan Discharge From [facility] Services: Discharge from [place] - call if wound re-opens. CONGRATULATIONS!!!!!!! =) Edema Control: Other: - Wear juxtalites bilateral daily, apply in the morning and remove at night 1. The patient can be discharged from the clinic. 2. He understands to wear his juxta lite stockings daily. He has double layer stockings that he going to wear to golf 3. Skin lubrication Electronic Signature(s) Signed: 08/12/2019 5:26:28 PM By: Linton Ham MD Entered By: Linton Ham on 08/12/2019 15:32:09 -------------------------------------------------------------------------------- SuperBill Details Patient Name: Date of Service: [name] 08/12/2019 Medical Record [name] Patient Account Number: [id number] Date of Birth/Sex: Treating RN: [birth year] (64 y.o. [name] Primary Care Provider: Pricilla [name] Other Clinician: Referring Provider: Treating Provider/Extender:Humbert Morozov, [name], [name] in  Treatment: 54 Diagnosis Coding ICD-10 Codes Code Description [phone] Non-pressure chronic ulcer of right calf with fat layer exposed E11.622 Type 2 diabetes mellitus with other skin ulcer I87.321 Chronic venous hypertension (idiopathic) with inflammation of right lower extremity S81.811D Laceration without foreign body, right lower leg, subsequent encounter L97.822 Non-pressure chronic ulcer of other part of left lower leg with fat layer exposed Facility Procedures CPT4 Code: [id number] Description: [facility] VISIT-LEV 3 EST PT Modifier: Quantity: 1 Physician Procedures CPT4 Code Description: [id number] - WC PHYS LEVEL 2 - EST PT ICD-10 Diagnosis Description L97.212 Non-pressure chronic ulcer of right calf with fat layer L97.822 Non-pressure chronic ulcer of other part of left lower l Modifier: exposed eg with fat laye Quantity: 1 r exposed Electronic Signature(s) Signed: 08/12/2019 5:26:28 PM By: Linton Ham MD Entered By: Linton Ham  on 08/12/2019 15:32:34

## 2019-08-17 ENCOUNTER — Other Ambulatory Visit: Payer: Self-pay | Admitting: Physician Assistant

## 2019-08-17 ENCOUNTER — Other Ambulatory Visit: Payer: Self-pay | Admitting: Internal Medicine

## 2019-08-31 ENCOUNTER — Other Ambulatory Visit: Payer: Self-pay | Admitting: Internal Medicine

## 2019-08-31 NOTE — Telephone Encounter (Signed)
New message:   1.Medication Requested: levothyroxine (SYNTHROID) 50 MCG tablet 2. Pharmacy (Name, Street, [place]): [id number] - [place], [place] - [address] AT [place] 3. On Med List: yes  4. Last Visit with PCP: 03/08/19  5. Next visit date with PCP: None  Pt asked if it is possible to have some refills put on this prescription   Agent: Please be advised that RX refills may take up to 3 business days. We ask that you follow-up with your pharmacy.

## 2019-09-01 ENCOUNTER — Other Ambulatory Visit: Payer: Self-pay

## 2019-09-01 MED ORDER — LEVOTHYROXINE SODIUM 50 MCG PO TABS: ORAL_TABLET | ORAL | 1 refills | Status: DC

## 2019-09-01 NOTE — Telephone Encounter (Signed)
Faxed in today. 

## 2019-09-02 ENCOUNTER — Inpatient Hospital Stay (HOSPITAL_COMMUNITY)
Admission: EM | Admit: 2019-09-02 | Discharge: 2019-09-07 | DRG: 638 | Disposition: A | Discharge status: Home / Self Care | Payer: BC Managed Care – PPO | Attending: Internal Medicine | Admitting: Internal Medicine

## 2019-09-02 ENCOUNTER — Other Ambulatory Visit: Payer: Self-pay

## 2019-09-02 DIAGNOSIS — I4892 Unspecified atrial flutter: Secondary | ICD-10-CM | POA: Diagnosis present

## 2019-09-02 DIAGNOSIS — I428 Other cardiomyopathies: Secondary | ICD-10-CM | POA: Diagnosis present

## 2019-09-02 DIAGNOSIS — Z20822 Contact with and (suspected) exposure to covid-19: Secondary | ICD-10-CM | POA: Diagnosis present

## 2019-09-02 DIAGNOSIS — I5022 Chronic systolic (congestive) heart failure: Secondary | ICD-10-CM | POA: Diagnosis present

## 2019-09-02 DIAGNOSIS — E039 Hypothyroidism, unspecified: Secondary | ICD-10-CM | POA: Diagnosis present

## 2019-09-02 DIAGNOSIS — Z7901 Long term (current) use of anticoagulants: Secondary | ICD-10-CM

## 2019-09-02 DIAGNOSIS — E875 Hyperkalemia: Secondary | ICD-10-CM | POA: Diagnosis present

## 2019-09-02 DIAGNOSIS — I4819 Other persistent atrial fibrillation: Secondary | ICD-10-CM | POA: Diagnosis present

## 2019-09-02 DIAGNOSIS — N179 Acute kidney failure, unspecified: Secondary | ICD-10-CM | POA: Diagnosis not present

## 2019-09-02 DIAGNOSIS — E1169 Type 2 diabetes mellitus with other specified complication: Secondary | ICD-10-CM | POA: Diagnosis present

## 2019-09-02 DIAGNOSIS — E1122 Type 2 diabetes mellitus with diabetic chronic kidney disease: Secondary | ICD-10-CM | POA: Diagnosis present

## 2019-09-02 DIAGNOSIS — I4891 Unspecified atrial fibrillation: Secondary | ICD-10-CM | POA: Diagnosis present

## 2019-09-02 DIAGNOSIS — Z6838 Body mass index (BMI) 38.0-38.9, adult: Secondary | ICD-10-CM

## 2019-09-02 DIAGNOSIS — Z8249 Family history of ischemic heart disease and other diseases of the circulatory system: Secondary | ICD-10-CM

## 2019-09-02 DIAGNOSIS — E1100 Type 2 diabetes mellitus with hyperosmolarity without nonketotic hyperglycemic-hyperosmolar coma (NKHHC): Secondary | ICD-10-CM | POA: Diagnosis not present

## 2019-09-02 DIAGNOSIS — G4730 Sleep apnea, unspecified: Secondary | ICD-10-CM | POA: Diagnosis present

## 2019-09-02 DIAGNOSIS — R001 Bradycardia, unspecified: Secondary | ICD-10-CM | POA: Diagnosis not present

## 2019-09-02 DIAGNOSIS — Z800 Family history of malignant neoplasm of digestive organs: Secondary | ICD-10-CM

## 2019-09-02 DIAGNOSIS — Z8719 Personal history of other diseases of the digestive system: Secondary | ICD-10-CM

## 2019-09-02 DIAGNOSIS — I130 Hypertensive heart and chronic kidney disease with heart failure and stage 1 through stage 4 chronic kidney disease, or unspecified chronic kidney disease: Secondary | ICD-10-CM | POA: Diagnosis present

## 2019-09-02 DIAGNOSIS — I10 Essential (primary) hypertension: Secondary | ICD-10-CM | POA: Diagnosis present

## 2019-09-02 DIAGNOSIS — Z79890 Hormone replacement therapy (postmenopausal): Secondary | ICD-10-CM

## 2019-09-02 DIAGNOSIS — E1110 Type 2 diabetes mellitus with ketoacidosis without coma: Secondary | ICD-10-CM

## 2019-09-02 DIAGNOSIS — J45909 Unspecified asthma, uncomplicated: Secondary | ICD-10-CM | POA: Diagnosis present

## 2019-09-02 DIAGNOSIS — Z825 Family history of asthma and other chronic lower respiratory diseases: Secondary | ICD-10-CM

## 2019-09-02 DIAGNOSIS — I452 Bifascicular block: Secondary | ICD-10-CM | POA: Diagnosis present

## 2019-09-02 DIAGNOSIS — I420 Dilated cardiomyopathy: Secondary | ICD-10-CM | POA: Diagnosis present

## 2019-09-02 DIAGNOSIS — I440 Atrioventricular block, first degree: Secondary | ICD-10-CM | POA: Diagnosis not present

## 2019-09-02 DIAGNOSIS — Z9119 Patient's noncompliance with other medical treatment and regimen: Secondary | ICD-10-CM

## 2019-09-02 DIAGNOSIS — G4733 Obstructive sleep apnea (adult) (pediatric): Secondary | ICD-10-CM | POA: Diagnosis present

## 2019-09-02 DIAGNOSIS — E1165 Type 2 diabetes mellitus with hyperglycemia: Secondary | ICD-10-CM | POA: Diagnosis present

## 2019-09-02 DIAGNOSIS — Z7951 Long term (current) use of inhaled steroids: Secondary | ICD-10-CM

## 2019-09-02 DIAGNOSIS — K5790 Diverticulosis of intestine, part unspecified, without perforation or abscess without bleeding: Secondary | ICD-10-CM | POA: Diagnosis present

## 2019-09-02 DIAGNOSIS — E1136 Type 2 diabetes mellitus with diabetic cataract: Secondary | ICD-10-CM | POA: Diagnosis present

## 2019-09-02 DIAGNOSIS — R739 Hyperglycemia, unspecified: Secondary | ICD-10-CM

## 2019-09-02 DIAGNOSIS — E785 Hyperlipidemia, unspecified: Secondary | ICD-10-CM | POA: Diagnosis present

## 2019-09-02 DIAGNOSIS — Z841 Family history of disorders of kidney and ureter: Secondary | ICD-10-CM

## 2019-09-02 DIAGNOSIS — H269 Unspecified cataract: Secondary | ICD-10-CM | POA: Diagnosis present

## 2019-09-02 DIAGNOSIS — R946 Abnormal results of thyroid function studies: Secondary | ICD-10-CM | POA: Diagnosis present

## 2019-09-02 DIAGNOSIS — Z91138 Patient's unintentional underdosing of medication regimen for other reason: Secondary | ICD-10-CM

## 2019-09-02 DIAGNOSIS — F1010 Alcohol abuse, uncomplicated: Secondary | ICD-10-CM | POA: Diagnosis present

## 2019-09-02 DIAGNOSIS — Z7984 Long term (current) use of oral hypoglycemic drugs: Secondary | ICD-10-CM

## 2019-09-02 DIAGNOSIS — T381X6A Underdosing of thyroid hormones and substitutes, initial encounter: Secondary | ICD-10-CM | POA: Diagnosis present

## 2019-09-02 DIAGNOSIS — Z79899 Other long term (current) drug therapy: Secondary | ICD-10-CM

## 2019-09-02 DIAGNOSIS — N1832 Chronic kidney disease, stage 3b: Secondary | ICD-10-CM | POA: Diagnosis present

## 2019-09-02 DIAGNOSIS — E869 Volume depletion, unspecified: Secondary | ICD-10-CM | POA: Diagnosis present

## 2019-09-02 HISTORY — DX: Type 2 diabetes mellitus without complications: E11.9

## 2019-09-02 HISTORY — DX: Other seasonal allergic rhinitis: J30.2

## 2019-09-02 LAB — POCT I-STAT EG7
Acid-base deficit: 8 mmol/L — ABNORMAL HIGH (ref 0.0–2.0)
Bicarbonate: 20.8 mmol/L (ref 20.0–28.0)
Calcium, Ion: 1.13 mmol/L — ABNORMAL LOW (ref 1.15–1.40)
HCT: 48 % (ref 39.0–52.0)
Hemoglobin: 16.3 g/dL (ref 13.0–17.0)
O2 Saturation: 71 %
Potassium: 5.5 mmol/L — ABNORMAL HIGH (ref 3.5–5.1)
Sodium: 113 mmol/L — CL (ref 135–145)
TCO2: 22 mmol/L (ref 22–32)
pCO2, Ven: 52.7 mmHg (ref 44.0–60.0)
pH, Ven: 7.205 — ABNORMAL LOW (ref 7.250–7.430)
pO2, Ven: 46 mmHg — ABNORMAL HIGH (ref 32.0–45.0)

## 2019-09-02 LAB — CBC
HCT: 44.2 % (ref 39.0–52.0)
Hemoglobin: 15.1 g/dL (ref 13.0–17.0)
MCH: 31.3 pg (ref 26.0–34.0)
MCHC: 34.2 g/dL (ref 30.0–36.0)
MCV: 91.7 fL (ref 80.0–100.0)
Platelets: 196 10*3/uL (ref 150–400)
RBC: 4.82 MIL/uL (ref 4.22–5.81)
RDW: 13.8 % (ref 11.5–15.5)
WBC: 7 10*3/uL (ref 4.0–10.5)
nRBC: 0 % (ref 0.0–0.2)

## 2019-09-02 LAB — BASIC METABOLIC PANEL
Anion gap: 17 — ABNORMAL HIGH (ref 5–15)
BUN: 62 mg/dL — ABNORMAL HIGH (ref 8–23)
CO2: 20 mmol/L — ABNORMAL LOW (ref 22–32)
Calcium: 8.9 mg/dL (ref 8.9–10.3)
Chloride: 76 mmol/L — ABNORMAL LOW (ref 98–111)
Creatinine, Ser: 3.24 mg/dL — ABNORMAL HIGH (ref 0.61–1.24)
GFR calc Af Amer: 22 mL/min — ABNORMAL LOW (ref >60)
GFR calc non Af Amer: 19 mL/min — ABNORMAL LOW (ref >60)
Glucose, Bld: 1186 mg/dL (ref 70–99)
Potassium: 6.1 mmol/L — ABNORMAL HIGH (ref 3.5–5.1)
Sodium: 113 mmol/L — CL (ref 135–145)

## 2019-09-02 LAB — URINALYSIS, ROUTINE W REFLEX MICROSCOPIC
Bacteria, UA: NONE SEEN
Bilirubin Urine: NEGATIVE
Glucose, UA: >=500 mg/dL — AB
Ketones, ur: NEGATIVE mg/dL
Leukocytes,Ua: NEGATIVE
Nitrite: NEGATIVE
Protein, ur: NEGATIVE mg/dL
Specific Gravity, Urine: 1.023 (ref 1.005–1.030)
pH: 5 (ref 5.0–8.0)

## 2019-09-02 LAB — CBG MONITORING, ED
Glucose-Capillary: >600 mg/dL (ref 70–99)
Glucose-Capillary: >600 mg/dL (ref 70–99)
Glucose-Capillary: >600 mg/dL (ref 70–99)

## 2019-09-02 MED ORDER — CALCIUM GLUCONATE 10 % IV SOLN
1.0000 g | Freq: Once | INTRAVENOUS | Status: AC
  Administered 2019-09-02: 1 g via INTRAVENOUS
  Filled 2019-09-02: qty 10

## 2019-09-02 MED ORDER — INSULIN REGULAR(HUMAN) IN NACL 100-0.9 UT/100ML-% IV SOLN
INTRAVENOUS | Status: DC
  Administered 2019-09-02: 11.1 [IU]/h via INTRAVENOUS
  Administered 2019-09-03: 8.5 [IU]/h via INTRAVENOUS
  Administered 2019-09-04: 11 [IU]/h via INTRAVENOUS
  Filled 2019-09-02 (×4): qty 100

## 2019-09-02 MED ORDER — DEXTROSE-NACL 5-0.45 % IV SOLN: INTRAVENOUS | Status: DC

## 2019-09-02 MED ORDER — ALBUTEROL SULFATE HFA 108 (90 BASE) MCG/ACT IN AERS
8.0000 | INHALATION_SPRAY | Freq: Once | RESPIRATORY_TRACT | Status: AC
  Administered 2019-09-02: 8 via RESPIRATORY_TRACT
  Filled 2019-09-02: qty 6.7

## 2019-09-02 MED ORDER — LACTATED RINGERS IV BOLUS
1000.0000 mL | Freq: Once | INTRAVENOUS | Status: AC
  Administered 2019-09-02: 1000 mL via INTRAVENOUS

## 2019-09-02 MED ORDER — DEXTROSE 50 % IV SOLN: 0.0000 mL | INTRAVENOUS | Status: DC | PRN

## 2019-09-02 MED ORDER — SODIUM CHLORIDE 0.9% FLUSH: 3.0000 mL | Freq: Once | INTRAVENOUS | Status: DC

## 2019-09-02 MED ORDER — LACTATED RINGERS IV BOLUS: 1000.0000 mL | Freq: Once | INTRAVENOUS | Status: DC

## 2019-09-02 MED ORDER — SODIUM CHLORIDE 0.9 % IV SOLN: 1.0000 g | Freq: Once | INTRAVENOUS | Status: DC

## 2019-09-02 MED ORDER — SODIUM CHLORIDE 0.9 % IV SOLN: INTRAVENOUS | Status: DC

## 2019-09-02 MED ORDER — CALCIUM GLUCONATE-NACL 1-0.675 GM/50ML-% IV SOLN
1.0000 g | Freq: Once | INTRAVENOUS | Status: AC
  Administered 2019-09-02: 1000 mg via INTRAVENOUS
  Filled 2019-09-02: qty 50

## 2019-09-02 NOTE — ED Provider Notes (Signed)
[facility] EMERGENCY DEPARTMENT Provider Note   CSN: [id number] Arrival date & time: 09/02/19  2121     History Chief Complaint  Patient presents with  . Dizziness    [name] is a 64 y.o. male.  HPI 64 year old male presents with dizziness.  Started yesterday.  Whenever he moves he feels off balance.  He bent over to check something and kept going to the ground.  Did not pass out.  Does not really feel lightheaded.  Found to have a glucose over 600 on check here.  He has diabetes and is on glipizide but states he does not normally check his blood sugar and to his knowledge it has never been over 170.  No headache.  Is been having blurry but not double vision for the last 2 days.  He has been having increased thirst and urination.  No vomiting, diarrhea, chest pain, shortness of breath or focal weakness.   Past Medical History:  Diagnosis Date  . Allergy    SEASONAL  . Asthma    as a teenager - does not use an inhaler although pt said he has an albuteral inhaler  . Atrial fibrillation ([place])    persistant 02/2009  . Atrial flutter ([place])    s/p CTI ablation 08/06/10  . Cardiac arrhythmia due to congenital heart disease   . Cataract    BEGINING  . Chronic rhinitis   . Chronic systolic dysfunction of left ventricle   . Clotting disorder ([place])    Takes Pradaxa for A- Fib.  Last dose of Pradaxa was 05-06-19  . Colon polyp   . Congestive heart failure ([place])   . Diabetes mellitus without complication ([place])   . Diverticulosis    colonoscopy 04/03/2009  . Hyperlipidemia   . Hypertension   . Hypertension   . Morbid obesity ([place])    target weight = 219  for BMI < 30  . Nonischemic cardiomyopathy ([facility])    tachycardia mediated  . Sleep apnea    original 2001 - wears C-PaP    Patient Active Problem List   Diagnosis Date Noted  . Dilated cardiomyopathy ([place]) 03/30/2019  . History of 2019 novel coronavirus disease (COVID-19) 02/22/2019  . Symptomatic  bradycardia 01/31/2019  . Dizziness 01/31/2019  . Diabetic ulcer of calf associated with type 2 diabetes mellitus, with fat layer exposed ([place]) 05/20/2018  . Type 2 diabetes mellitus with hyperlipidemia ([place]) 09/09/2016  . History of colon polyps 05/17/2014  . Routine general medical examination at a health care facility 05/08/2014  . Atrial flutter ([place]) 07/12/2010  . Hypothyroidism 03/22/2010  . Chronic systolic heart failure ([place]) 03/28/2009  . ATRIAL FIBRILLATION 03/09/2009  . Asthma 01/25/2008  . Hyperlipidemia associated with type 2 diabetes mellitus ([place]) 01/24/2008  . MORBID OBESITY 01/24/2008  . Essential hypertension 01/24/2008  . Sleep apnea 01/24/2008    Past Surgical History:  Procedure Laterality Date  . ATRIAL FIBRILLATION ABLATION N/A 12/14/2018   Procedure: ATRIAL FIBRILLATION ABLATION;  Surgeon: Thompson Grayer, MD;  Location: [place] [facility];  Service: Cardiovascular;  Laterality: N/A;  . atrial flutter ablation  08/06/10  . CARDIOVERSION N/A 07/05/2018   Procedure: CARDIOVERSION;  Surgeon: Buford Dresser, MD;  Location: [facility] ENDOSCOPY;  Service: Cardiovascular;  Laterality: N/A;  . COLONOSCOPY    . POLYPECTOMY    . TEE WITHOUT CARDIOVERSION N/A 12/14/2018   Procedure: TRANSESOPHAGEAL ECHOCARDIOGRAM (TEE);  Surgeon: Thompson Grayer, MD;  Location: [place] [facility];  Service: Cardiovascular;  Laterality: N/A;       Family History  Problem Relation Age of Onset  . Hypertension Mother   . Kidney failure Mother        Due to sepsis  . COPD Father   . Colon cancer Brother        dx in 49's  . Esophageal cancer Neg Hx   . Rectal cancer Neg Hx   . Stomach cancer Neg Hx     Social History   Tobacco Use  . Smoking status: Never Smoker  . Smokeless tobacco: Never Used  Substance Use Topics  . Alcohol use: Yes    Alcohol/week: 21.0 standard drinks    Types: 21 Cans of beer per week    Comment: previously quite heavy, working on cessation   . Drug use:  No    Home Medications Prior to Admission medications   Medication Sig Start Date End Date Taking? Authorizing Provider  albuterol (PROVENTIL HFA;VENTOLIN HFA) 108 (90 Base) MCG/ACT inhaler Inhale 2 puffs into the lungs every 6 (six) hours as needed for wheezing or shortness of breath.   Yes [provider]  amiodarone (PACERONE) 200 MG tablet Take 1 tablet (200 mg total) by mouth daily. 03/30/19  Yes Allred, Jeneen Rinks, MD  carvedilol (COREG) 6.25 MG tablet Take 1 tablet (6.25 mg total) by mouth 2 (two) times daily with a meal. 03/24/19  Yes Buford Dresser, MD  doxylamine, Sleep, (UNISOM) 25 MG tablet Take 50 mg by mouth at bedtime as needed for sleep.    Yes [provider]  furosemide (LASIX) 40 MG tablet TAKE 1 TABLET TWICE DAILY Patient taking differently: Take 40 mg by mouth 2 (two) times daily.  06/20/19  Yes [name], MD  gabapentin (NEURONTIN) 300 MG capsule TAKE 1 CAPSULE BY MOUTH THREE TIMES A DAY Patient taking differently: Take 600 mg by mouth in the morning and at bedtime.  05/02/19  Yes [name], MD  glipiZIDE (GLUCOTROL) 5 MG tablet TAKE 1 TABLET BY MOUTH TWICE A DAY BEFORE MEALS Patient taking differently: Take 5 mg by mouth 2 (two) times daily before a meal.  08/17/19  Yes [name], MD  hydrALAZINE (APRESOLINE) 50 MG tablet Take 1 tablet (50 mg total) by mouth 2 (two) times daily. 03/24/19  Yes Buford Dresser, MD  isosorbide mononitrate (IMDUR) 30 MG 24 hr tablet TAKE 1 TABLET BY MOUTH EVERY DAY Patient taking differently: Take 30 mg by mouth in the morning.  04/01/19  Yes Buford Dresser, MD  levothyroxine (SYNTHROID) 50 MCG tablet TAKE 1 TABLET EVERY DAY BEFORE BREAKFAST Patient taking differently: Take 50 mcg by mouth daily before breakfast.  09/01/19  Yes [name], MD  losartan (COZAAR) 100 MG tablet Take 100 mg by mouth every morning.  05/15/19  Yes [provider]  mometasone  (NASONEX) 50 MCG/ACT nasal spray Place 2 sprays into the nose daily as needed (allergies or rhinitis).    Yes [provider]  PRADAXA 150 MG CAPS capsule TAKE 1 CAPSULE BY MOUTH TWICE A DAY Patient taking differently: Take 150 mg by mouth 2 (two) times daily.  06/01/19  Yes Allred, Jeneen Rinks, MD  rosuvastatin (CRESTOR) 10 MG tablet TAKE 1 TABLET BY MOUTH EVERY DAY Patient taking differently: Take 10 mg by mouth at bedtime.  06/06/19  Yes Buford Dresser, MD  spironolactone (ALDACTONE) 25 MG tablet Take 25 mg by mouth in the morning.    Yes [provider]  SYMBICORT 80-4.5 MCG/ACT  inhaler USE 2 INHALATIONS TWICE A DAY Patient taking differently: Inhale 2 puffs into the lungs 2 (two) times daily as needed (for flares).  02/12/17  Yes [name], MD  traMADol (ULTRAM) 50 MG tablet Take 50 mg by mouth every 6 (six) hours as needed (for pain).  05/02/19  Yes [provider]  vitamin C (ASCORBIC ACID) 500 MG tablet Take 500-1,000 mg by mouth daily.   Yes [provider]  carvedilol (COREG) 3.125 MG tablet TAKE 1 TABLET TWICE DAILY Patient not taking: Reported on 09/02/2019 06/08/19   [name], MD    Allergies    Patient has no known allergies.  Review of Systems   Review of Systems  Constitutional: Negative for fever.  Eyes: Positive for visual disturbance.  Respiratory: Negative for shortness of breath.   Cardiovascular: Negative for chest pain.  Gastrointestinal: Negative for abdominal pain and vomiting.  Endocrine: Positive for polydipsia and polyuria.  Neurological: Positive for dizziness. Negative for weakness and headaches.  All other systems reviewed and are negative.   Physical Exam Updated Vital Signs BP 115/68   Pulse (!) 41   Temp 98.1 F (36.7 C) (Oral)   Resp 18   Ht 6\' 1"  (1.854 m)   Wt 129.3 kg   SpO2 94%   BMI 37.60 kg/m   Physical Exam Vitals and nursing note reviewed.  Constitutional:      General:  He is not in acute distress.    Appearance: He is well-developed. He is obese. He is not ill-appearing or diaphoretic.  HENT:     Head: Normocephalic and atraumatic.     Right Ear: External ear normal.     Left Ear: External ear normal.     Nose: Nose normal.  Eyes:     General:        Right eye: No discharge.        Left eye: No discharge.     Extraocular Movements: Extraocular movements intact.     Pupils: Pupils are equal, round, and reactive to light.  Cardiovascular:     Rate and Rhythm: Normal rate and regular rhythm.     Heart sounds: Normal heart sounds.  Pulmonary:     Effort: Pulmonary effort is normal.     Breath sounds: Normal breath sounds.  Abdominal:     Palpations: Abdomen is soft.     Tenderness: There is no abdominal tenderness.  Musculoskeletal:     Cervical back: Neck supple.  Skin:    General: Skin is warm and dry.  Neurological:     Mental Status: He is alert.     Comments: CN 3-12 grossly intact. 5/5 strength in all 4 extremities. Grossly normal sensation. Normal finger to nose.   Psychiatric:        Mood and Affect: Mood is not anxious.     ED Results / Procedures / Treatments   Labs (all labs ordered are listed, but only abnormal results are displayed) Labs Reviewed  BASIC METABOLIC PANEL - Abnormal; Notable for the following components:      Result Value   Sodium 113 (*)    Potassium 6.1 (*)    Chloride 76 (*)    CO2 20 (*)    Glucose, Bld 1,186 (*)    BUN 62 (*)    Creatinine, Ser 3.24 (*)    GFR calc non Af Amer 19 (*)    GFR calc Af Amer 22 (*)    Anion gap 17 (*)  All other components within normal limits  URINALYSIS, ROUTINE W REFLEX MICROSCOPIC - Abnormal; Notable for the following components:   Glucose, UA >=500 (*)    Hgb urine dipstick MODERATE (*)    All other components within normal limits  CBG MONITORING, ED - Abnormal; Notable for the following components:   Glucose-Capillary >600 (*)    All other components within  normal limits  CBG MONITORING, ED - Abnormal; Notable for the following components:   Glucose-Capillary >600 (*)    All other components within normal limits  POCT I-STAT EG7 - Abnormal; Notable for the following components:   pH, Ven 7.205 (*)    pO2, Ven 46.0 (*)    Acid-base deficit 8.0 (*)    Sodium 113 (*)    Potassium 5.5 (*)    Calcium, Ion 1.13 (*)    All other components within normal limits  CBG MONITORING, ED - Abnormal; Notable for the following components:   Glucose-Capillary >600 (*)    All other components within normal limits  RESPIRATORY PANEL BY RT PCR (FLU A&B, COVID)  CBC  OSMOLALITY  I-STAT CREATININE, ED  POCT I-STAT EG7  TROPONIN I (HIGH SENSITIVITY)    EKG EKG Interpretation  Date/Time:  Friday September 02 2019 23:41:14 EDT Ventricular Rate:  37 PR Interval:    QRS Duration: 154 QT Interval:  547 QTC Calculation: 430 R Axis:   -70 Text Interpretation: Junctional rhythm RBBB and LAFB no significant change since earlier in the day Confirmed by Sherwood Gambler [phone]) on 09/02/2019 11:58:24 PM   Radiology No results found.  Procedures .Critical Care Performed by: Sherwood Gambler, MD Authorized by: Sherwood Gambler, MD   Critical care provider statement:    Critical care time (minutes):  50   Critical care time was exclusive of:  Separately billable procedures and treating other patients   Critical care was necessary to treat or prevent imminent or life-threatening deterioration of the following conditions:  Circulatory failure, endocrine crisis and renal failure   Critical care was time spent personally by me on the following activities:  Discussions with consultants, evaluation of patient's response to treatment, examination of patient, ordering and performing treatments and interventions, ordering and review of laboratory studies, ordering and review of radiographic studies, pulse oximetry, re-evaluation of patient's condition, obtaining history from  patient or surrogate and review of old charts   (including critical care time)  Medications Ordered in ED Medications  sodium chloride flush (NS) 0.9 % injection 3 mL (3 mLs Intravenous Not Given 09/02/19 2244)  insulin regular, human (MYXREDLIN) 100 units/ 100 mL infusion (11.5 Units/hr Intravenous Rate/Dose Change 09/02/19 2344)  0.9 %  sodium chloride infusion ( Intravenous New Bag/Given 09/02/19 2253)  dextrose 5 %-0.45 % sodium chloride infusion ( Intravenous Not Given 09/02/19 2310)  dextrose 50 % solution 0-50 mL (has no administration in time range)  calcium gluconate 1 g/ 50 mL sodium chloride IVPB (0 g Intravenous Stopped 09/02/19 2326)  lactated ringers bolus 1,000 mL (0 mLs Intravenous Stopped 09/02/19 2326)  calcium gluconate inj 10% (1 g) URGENT USE ONLY! (1 g Intravenous Given 09/02/19 2314)  albuterol (VENTOLIN HFA) 108 (90 Base) MCG/ACT inhaler 8 puff (8 puffs Inhalation Given 09/02/19 2314)    ED Course  I have reviewed the triage vital signs and the nursing notes.  Pertinent labs & imaging results that were available during my care of the patient were reviewed by me and considered in my medical decision making (see chart for details).  MDM Rules/Calculators/A&P                      Patient presents with dizziness.  Multiple possible causes including his severe hyperglycemia of 1100, acute kidney injury, and bradycardia.  The bradycardia appears to be junctional though at times I see P waves.  The right bundle branch block is stable from old and it does not appear that he has a new/widened QRS. Corrected Na is ~130. He is not hypotensive but with the bradycardia he will need intensive care monitoring. D/w ICU who asks for hospitalist admission. Dr. Alcario Drought indicates he is too ill for stepdown with his bradycardia. ICU will admit. He was given 2 doses of calcium gluconate with no response to the heart rate. Thus, I doubt this is primarily a hyperkalemia cause of his bradycardia.  He is on multiple cardiac meds and I suspect these have a stronger effect because of his kidney injury. BP and mental status is stable.   [name] was evaluated in Emergency Department on 09/02/2019 for the symptoms described in the history of present illness. He was evaluated in the context of the global COVID-19 pandemic, which necessitated consideration that the patient might be at risk for infection with the SARS-CoV-2 virus that causes COVID-19. Institutional protocols and algorithms that pertain to the evaluation of patients at risk for COVID-19 are in a state of rapid change based on information released by regulatory bodies including the CDC and federal and state organizations. These policies and algorithms were followed during the patient's care in the ED.  Final Clinical Impression(s) / ED Diagnoses Final diagnoses:  Hyperglycemia  Acute kidney injury ([place])  Hyperkalemia  Bradycardia    Rx / DC Orders ED Discharge Orders    None       Sherwood Gambler, MD 09/03/19 [phone]

## 2019-09-02 NOTE — ED Triage Notes (Signed)
Pt says that yesterday he woke up feeling lightheaded and dizzy. Pt says his glucometer was reading "high" today. Pt says that he has had 2 falls today. No LOC. Hr in the 40's in triage.

## 2019-09-03 ENCOUNTER — Encounter (HOSPITAL_COMMUNITY): Payer: Self-pay | Admitting: Student

## 2019-09-03 ENCOUNTER — Other Ambulatory Visit (HOSPITAL_COMMUNITY): Payer: BC Managed Care – PPO

## 2019-09-03 ENCOUNTER — Inpatient Hospital Stay (HOSPITAL_COMMUNITY): Payer: BC Managed Care – PPO

## 2019-09-03 DIAGNOSIS — I5022 Chronic systolic (congestive) heart failure: Secondary | ICD-10-CM | POA: Diagnosis not present

## 2019-09-03 DIAGNOSIS — I130 Hypertensive heart and chronic kidney disease with heart failure and stage 1 through stage 4 chronic kidney disease, or unspecified chronic kidney disease: Secondary | ICD-10-CM | POA: Diagnosis not present

## 2019-09-03 DIAGNOSIS — E785 Hyperlipidemia, unspecified: Secondary | ICD-10-CM | POA: Diagnosis not present

## 2019-09-03 DIAGNOSIS — G4733 Obstructive sleep apnea (adult) (pediatric): Secondary | ICD-10-CM | POA: Diagnosis present

## 2019-09-03 DIAGNOSIS — J4520 Mild intermittent asthma, uncomplicated: Secondary | ICD-10-CM | POA: Diagnosis not present

## 2019-09-03 DIAGNOSIS — F1010 Alcohol abuse, uncomplicated: Secondary | ICD-10-CM | POA: Diagnosis present

## 2019-09-03 DIAGNOSIS — Z20822 Contact with and (suspected) exposure to covid-19: Secondary | ICD-10-CM | POA: Diagnosis not present

## 2019-09-03 DIAGNOSIS — I4819 Other persistent atrial fibrillation: Secondary | ICD-10-CM | POA: Diagnosis not present

## 2019-09-03 DIAGNOSIS — R946 Abnormal results of thyroid function studies: Secondary | ICD-10-CM | POA: Diagnosis present

## 2019-09-03 DIAGNOSIS — E1100 Type 2 diabetes mellitus with hyperosmolarity without nonketotic hyperglycemic-hyperosmolar coma (NKHHC): Secondary | ICD-10-CM | POA: Diagnosis not present

## 2019-09-03 DIAGNOSIS — E1169 Type 2 diabetes mellitus with other specified complication: Secondary | ICD-10-CM | POA: Diagnosis not present

## 2019-09-03 DIAGNOSIS — I452 Bifascicular block: Secondary | ICD-10-CM | POA: Diagnosis not present

## 2019-09-03 DIAGNOSIS — E1110 Type 2 diabetes mellitus with ketoacidosis without coma: Secondary | ICD-10-CM | POA: Diagnosis not present

## 2019-09-03 DIAGNOSIS — E1010 Type 1 diabetes mellitus with ketoacidosis without coma: Secondary | ICD-10-CM | POA: Diagnosis not present

## 2019-09-03 DIAGNOSIS — I480 Paroxysmal atrial fibrillation: Secondary | ICD-10-CM | POA: Diagnosis not present

## 2019-09-03 DIAGNOSIS — T381X6A Underdosing of thyroid hormones and substitutes, initial encounter: Secondary | ICD-10-CM | POA: Diagnosis present

## 2019-09-03 DIAGNOSIS — I420 Dilated cardiomyopathy: Secondary | ICD-10-CM | POA: Diagnosis not present

## 2019-09-03 DIAGNOSIS — Z8679 Personal history of other diseases of the circulatory system: Secondary | ICD-10-CM | POA: Diagnosis not present

## 2019-09-03 DIAGNOSIS — I4892 Unspecified atrial flutter: Secondary | ICD-10-CM | POA: Diagnosis not present

## 2019-09-03 DIAGNOSIS — E1122 Type 2 diabetes mellitus with diabetic chronic kidney disease: Secondary | ICD-10-CM | POA: Diagnosis present

## 2019-09-03 DIAGNOSIS — E1165 Type 2 diabetes mellitus with hyperglycemia: Secondary | ICD-10-CM | POA: Diagnosis present

## 2019-09-03 DIAGNOSIS — Z91138 Patient's unintentional underdosing of medication regimen for other reason: Secondary | ICD-10-CM | POA: Diagnosis not present

## 2019-09-03 DIAGNOSIS — I5042 Chronic combined systolic (congestive) and diastolic (congestive) heart failure: Secondary | ICD-10-CM | POA: Diagnosis not present

## 2019-09-03 DIAGNOSIS — J45909 Unspecified asthma, uncomplicated: Secondary | ICD-10-CM | POA: Diagnosis present

## 2019-09-03 DIAGNOSIS — N179 Acute kidney failure, unspecified: Secondary | ICD-10-CM | POA: Diagnosis not present

## 2019-09-03 DIAGNOSIS — N1832 Chronic kidney disease, stage 3b: Secondary | ICD-10-CM | POA: Diagnosis present

## 2019-09-03 DIAGNOSIS — E875 Hyperkalemia: Secondary | ICD-10-CM | POA: Diagnosis present

## 2019-09-03 DIAGNOSIS — R001 Bradycardia, unspecified: Secondary | ICD-10-CM

## 2019-09-03 DIAGNOSIS — I428 Other cardiomyopathies: Secondary | ICD-10-CM | POA: Diagnosis not present

## 2019-09-03 DIAGNOSIS — Z6838 Body mass index (BMI) 38.0-38.9, adult: Secondary | ICD-10-CM | POA: Diagnosis not present

## 2019-09-03 DIAGNOSIS — R739 Hyperglycemia, unspecified: Secondary | ICD-10-CM | POA: Diagnosis not present

## 2019-09-03 DIAGNOSIS — E039 Hypothyroidism, unspecified: Secondary | ICD-10-CM | POA: Diagnosis present

## 2019-09-03 LAB — POCT I-STAT EG7
Acid-base deficit: 7 mmol/L — ABNORMAL HIGH (ref 0.0–2.0)
Bicarbonate: 18.3 mmol/L — ABNORMAL LOW (ref 20.0–28.0)
Calcium, Ion: 1.4 mmol/L (ref 1.15–1.40)
HCT: 42 % (ref 39.0–52.0)
Hemoglobin: 14.3 g/dL (ref 13.0–17.0)
O2 Saturation: 84 %
Potassium: 5.6 mmol/L — ABNORMAL HIGH (ref 3.5–5.1)
Sodium: 113 mmol/L — CL (ref 135–145)
TCO2: 19 mmol/L — ABNORMAL LOW (ref 22–32)
pCO2, Ven: 35.7 mmHg — ABNORMAL LOW (ref 44.0–60.0)
pH, Ven: 7.319 (ref 7.250–7.430)
pO2, Ven: 52 mmHg — ABNORMAL HIGH (ref 32.0–45.0)

## 2019-09-03 LAB — CBC
HCT: 41.3 % (ref 39.0–52.0)
Hemoglobin: 14.9 g/dL (ref 13.0–17.0)
MCH: 31 pg (ref 26.0–34.0)
MCHC: 36.1 g/dL — ABNORMAL HIGH (ref 30.0–36.0)
MCV: 85.9 fL (ref 80.0–100.0)
Platelets: 182 10*3/uL (ref 150–400)
RBC: 4.81 MIL/uL (ref 4.22–5.81)
RDW: 12.7 % (ref 11.5–15.5)
WBC: 8 10*3/uL (ref 4.0–10.5)
nRBC: 0 % (ref 0.0–0.2)

## 2019-09-03 LAB — BASIC METABOLIC PANEL
Anion gap: 13 (ref 5–15)
Anion gap: 10 (ref 5–15)
Anion gap: 11 (ref 5–15)
Anion gap: 10 (ref 5–15)
Anion gap: 6 (ref 5–15)
BUN: 54 mg/dL — ABNORMAL HIGH (ref 8–23)
BUN: 46 mg/dL — ABNORMAL HIGH (ref 8–23)
BUN: 43 mg/dL — ABNORMAL HIGH (ref 8–23)
BUN: 40 mg/dL — ABNORMAL HIGH (ref 8–23)
BUN: 32 mg/dL — ABNORMAL HIGH (ref 8–23)
CO2: 23 mmol/L (ref 22–32)
CO2: 24 mmol/L (ref 22–32)
CO2: 25 mmol/L (ref 22–32)
CO2: 26 mmol/L (ref 22–32)
CO2: 26 mmol/L (ref 22–32)
Calcium: 9.4 mg/dL (ref 8.9–10.3)
Calcium: 9.3 mg/dL (ref 8.9–10.3)
Calcium: 9.2 mg/dL (ref 8.9–10.3)
Calcium: 9.2 mg/dL (ref 8.9–10.3)
Calcium: 9 mg/dL (ref 8.9–10.3)
Chloride: 88 mmol/L — ABNORMAL LOW (ref 98–111)
Chloride: 95 mmol/L — ABNORMAL LOW (ref 98–111)
Chloride: 94 mmol/L — ABNORMAL LOW (ref 98–111)
Chloride: 96 mmol/L — ABNORMAL LOW (ref 98–111)
Chloride: 99 mmol/L (ref 98–111)
Creatinine, Ser: 2.71 mg/dL — ABNORMAL HIGH (ref 0.61–1.24)
Creatinine, Ser: 2.28 mg/dL — ABNORMAL HIGH (ref 0.61–1.24)
Creatinine, Ser: 2.09 mg/dL — ABNORMAL HIGH (ref 0.61–1.24)
Creatinine, Ser: 1.95 mg/dL — ABNORMAL HIGH (ref 0.61–1.24)
Creatinine, Ser: 1.88 mg/dL — ABNORMAL HIGH (ref 0.61–1.24)
GFR calc Af Amer: 27 mL/min — ABNORMAL LOW (ref >60)
GFR calc Af Amer: 34 mL/min — ABNORMAL LOW (ref >60)
GFR calc Af Amer: 38 mL/min — ABNORMAL LOW (ref >60)
GFR calc Af Amer: 41 mL/min — ABNORMAL LOW (ref >60)
GFR calc Af Amer: 43 mL/min — ABNORMAL LOW (ref >60)
GFR calc non Af Amer: 24 mL/min — ABNORMAL LOW (ref >60)
GFR calc non Af Amer: 29 mL/min — ABNORMAL LOW (ref >60)
GFR calc non Af Amer: 32 mL/min — ABNORMAL LOW (ref >60)
GFR calc non Af Amer: 35 mL/min — ABNORMAL LOW (ref >60)
GFR calc non Af Amer: 37 mL/min — ABNORMAL LOW (ref >60)
Glucose, Bld: 551 mg/dL (ref 70–99)
Glucose, Bld: 324 mg/dL — ABNORMAL HIGH (ref 70–99)
Glucose, Bld: 253 mg/dL — ABNORMAL HIGH (ref 70–99)
Glucose, Bld: 197 mg/dL — ABNORMAL HIGH (ref 70–99)
Glucose, Bld: 197 mg/dL — ABNORMAL HIGH (ref 70–99)
Potassium: 3.9 mmol/L (ref 3.5–5.1)
Potassium: 3.9 mmol/L (ref 3.5–5.1)
Potassium: 3.7 mmol/L (ref 3.5–5.1)
Potassium: 3.8 mmol/L (ref 3.5–5.1)
Potassium: 3.8 mmol/L (ref 3.5–5.1)
Sodium: 124 mmol/L — ABNORMAL LOW (ref 135–145)
Sodium: 129 mmol/L — ABNORMAL LOW (ref 135–145)
Sodium: 130 mmol/L — ABNORMAL LOW (ref 135–145)
Sodium: 132 mmol/L — ABNORMAL LOW (ref 135–145)
Sodium: 131 mmol/L — ABNORMAL LOW (ref 135–145)

## 2019-09-03 LAB — GLUCOSE, CAPILLARY
Glucose-Capillary: 181 mg/dL — ABNORMAL HIGH (ref 70–99)
Glucose-Capillary: 184 mg/dL — ABNORMAL HIGH (ref 70–99)
Glucose-Capillary: 190 mg/dL — ABNORMAL HIGH (ref 70–99)
Glucose-Capillary: 194 mg/dL — ABNORMAL HIGH (ref 70–99)
Glucose-Capillary: 201 mg/dL — ABNORMAL HIGH (ref 70–99)
Glucose-Capillary: 203 mg/dL — ABNORMAL HIGH (ref 70–99)
Glucose-Capillary: 207 mg/dL — ABNORMAL HIGH (ref 70–99)
Glucose-Capillary: 213 mg/dL — ABNORMAL HIGH (ref 70–99)
Glucose-Capillary: 233 mg/dL — ABNORMAL HIGH (ref 70–99)
Glucose-Capillary: 237 mg/dL — ABNORMAL HIGH (ref 70–99)
Glucose-Capillary: 238 mg/dL — ABNORMAL HIGH (ref 70–99)
Glucose-Capillary: 273 mg/dL — ABNORMAL HIGH (ref 70–99)
Glucose-Capillary: 281 mg/dL — ABNORMAL HIGH (ref 70–99)
Glucose-Capillary: 286 mg/dL — ABNORMAL HIGH (ref 70–99)
Glucose-Capillary: 323 mg/dL — ABNORMAL HIGH (ref 70–99)
Glucose-Capillary: 325 mg/dL — ABNORMAL HIGH (ref 70–99)
Glucose-Capillary: 365 mg/dL — ABNORMAL HIGH (ref 70–99)
Glucose-Capillary: 366 mg/dL — ABNORMAL HIGH (ref 70–99)
Glucose-Capillary: 428 mg/dL — ABNORMAL HIGH (ref 70–99)
Glucose-Capillary: 436 mg/dL — ABNORMAL HIGH (ref 70–99)
Glucose-Capillary: 464 mg/dL — ABNORMAL HIGH (ref 70–99)
Glucose-Capillary: 478 mg/dL — ABNORMAL HIGH (ref 70–99)
Glucose-Capillary: 537 mg/dL (ref 70–99)
Glucose-Capillary: >600 mg/dL (ref 70–99)
Glucose-Capillary: >600 mg/dL (ref 70–99)

## 2019-09-03 LAB — CBG MONITORING, ED: Glucose-Capillary: >600 mg/dL (ref 70–99)

## 2019-09-03 LAB — TROPONIN I (HIGH SENSITIVITY)
Troponin I (High Sensitivity): 61 ng/L — ABNORMAL HIGH (ref <18)
Troponin I (High Sensitivity): 84 ng/L — ABNORMAL HIGH (ref <18)
Troponin I (High Sensitivity): 82 ng/L — ABNORMAL HIGH (ref <18)

## 2019-09-03 LAB — RESPIRATORY PANEL BY RT PCR (FLU A&B, COVID)
Influenza A by PCR: NEGATIVE
Influenza B by PCR: NEGATIVE
SARS Coronavirus 2 by RT PCR: NEGATIVE

## 2019-09-03 LAB — T4, FREE: Free T4: 0.43 ng/dL — ABNORMAL LOW (ref 0.61–1.12)

## 2019-09-03 LAB — SARS CORONAVIRUS 2 (TAT 6-24 HRS): SARS Coronavirus 2: NEGATIVE

## 2019-09-03 LAB — MAGNESIUM: Magnesium: 2.4 mg/dL (ref 1.7–2.4)

## 2019-09-03 LAB — BETA-HYDROXYBUTYRIC ACID: Beta-Hydroxybutyric Acid: 0.13 mmol/L (ref 0.05–0.27)

## 2019-09-03 LAB — I-STAT CREATININE, ED
Creatinine, Ser: 2.8 mg/dL — ABNORMAL HIGH (ref 0.61–1.24)
Creatinine, Ser: 3 mg/dL — ABNORMAL HIGH (ref 0.61–1.24)

## 2019-09-03 LAB — TSH: TSH: 33.362 u[IU]/mL — ABNORMAL HIGH (ref 0.350–4.500)

## 2019-09-03 LAB — PHOSPHORUS: Phosphorus: 4.1 mg/dL (ref 2.5–4.6)

## 2019-09-03 LAB — OSMOLALITY: Osmolality: 354 mOsm/kg (ref 275–295)

## 2019-09-03 LAB — MRSA PCR SCREENING: MRSA by PCR: NEGATIVE

## 2019-09-03 MED ORDER — DOPAMINE-DEXTROSE 3.2-5 MG/ML-% IV SOLN
2.5000 ug/kg/min | INTRAVENOUS | Status: DC
  Administered 2019-09-03: 2.5 ug/kg/min via INTRAVENOUS
  Filled 2019-09-03: qty 250

## 2019-09-03 MED ORDER — GABAPENTIN 600 MG PO TABS
600.0000 mg | ORAL_TABLET | Freq: Two times a day (BID) | ORAL | Status: DC
  Administered 2019-09-03 – 2019-09-07 (×10): 600 mg via ORAL
  Filled 2019-09-03 (×11): qty 1

## 2019-09-03 MED ORDER — CHLORHEXIDINE GLUCONATE CLOTH 2 % EX PADS
6.0000 | MEDICATED_PAD | Freq: Every day | CUTANEOUS | Status: DC
  Administered 2019-09-03 – 2019-09-07 (×5): 6 via TOPICAL

## 2019-09-03 MED ORDER — ROSUVASTATIN CALCIUM 5 MG PO TABS
10.0000 mg | ORAL_TABLET | Freq: Every day | ORAL | Status: DC
  Administered 2019-09-03 – 2019-09-06 (×3): 10 mg via ORAL
  Filled 2019-09-03 (×4): qty 2

## 2019-09-03 MED ORDER — MOMETASONE FURO-FORMOTEROL FUM 100-5 MCG/ACT IN AERO
2.0000 | INHALATION_SPRAY | Freq: Two times a day (BID) | RESPIRATORY_TRACT | Status: DC
  Administered 2019-09-03 – 2019-09-07 (×8): 2 via RESPIRATORY_TRACT
  Filled 2019-09-03: qty 8.8

## 2019-09-03 MED ORDER — LEVOTHYROXINE SODIUM 50 MCG PO TABS
50.0000 ug | ORAL_TABLET | Freq: Every day | ORAL | Status: DC
  Administered 2019-09-03 – 2019-09-07 (×5): 50 ug via ORAL
  Filled 2019-09-03: qty 1
  Filled 2019-09-03 (×3): qty 2
  Filled 2019-09-03: qty 1

## 2019-09-03 MED ORDER — LACTATED RINGERS IV BOLUS
1000.0000 mL | Freq: Once | INTRAVENOUS | Status: AC
  Administered 2019-09-03: 1000 mL via INTRAVENOUS

## 2019-09-03 MED ORDER — ATROPINE SULFATE 1 MG/10ML IJ SOSY
PREFILLED_SYRINGE | INTRAMUSCULAR | Status: AC
  Filled 2019-09-03: qty 10

## 2019-09-03 MED ORDER — ALBUTEROL SULFATE (2.5 MG/3ML) 0.083% IN NEBU: 2.5000 mg | INHALATION_SOLUTION | Freq: Four times a day (QID) | RESPIRATORY_TRACT | Status: DC | PRN

## 2019-09-03 MED ORDER — TRAMADOL HCL 50 MG PO TABS: 50.0000 mg | ORAL_TABLET | Freq: Four times a day (QID) | ORAL | Status: DC | PRN

## 2019-09-03 MED ORDER — DABIGATRAN ETEXILATE MESYLATE 150 MG PO CAPS
150.0000 mg | ORAL_CAPSULE | Freq: Two times a day (BID) | ORAL | Status: DC
  Administered 2019-09-03 – 2019-09-07 (×10): 150 mg via ORAL
  Filled 2019-09-03 (×13): qty 1

## 2019-09-03 MED ORDER — DEXTROSE-NACL 5-0.45 % IV SOLN: INTRAVENOUS | Status: DC

## 2019-09-03 MED ORDER — ORAL CARE MOUTH RINSE
15.0000 mL | Freq: Two times a day (BID) | OROMUCOSAL | Status: DC
  Administered 2019-09-03 – 2019-09-06 (×8): 15 mL via OROMUCOSAL

## 2019-09-03 MED ORDER — DOXYLAMINE SUCCINATE (SLEEP) 25 MG PO TABS: 50.0000 mg | ORAL_TABLET | Freq: Every evening | ORAL | Status: DC | PRN

## 2019-09-03 MED ORDER — CHLORHEXIDINE GLUCONATE CLOTH 2 % EX PADS: 6.0000 | MEDICATED_PAD | Freq: Every day | CUTANEOUS | Status: DC

## 2019-09-03 NOTE — Progress Notes (Signed)
CRITICAL VALUE ALERT  Critical Value:  Serum osmolality 354  Date & Time Notied:  09/03/19  0612  Provider Notified: Dr. Lucile Shutters via nurse Solmon Ice   Orders Received/Actions taken: no orders at this time

## 2019-09-03 NOTE — Progress Notes (Signed)
eLink Physician-Brief Progress Note Patient Name: [name] DOB: [birth year] MRN: [id number]   Date of Service  09/03/2019  HPI/Events of Note  Pt with Troponin bump. Heart rate is 52 on 2.5 mcg of Dopamine.  eICU Interventions  Continue Dopamine as ordered, 2-D Echo this a.m. to check for RWMA   c/w  ACS.        [name] 09/03/2019, 6:12 AM

## 2019-09-03 NOTE — H&P (Addendum)
NAME:  [name], MRN:  [id number], DOB:  Dec 24, 1955, LOS: 0 ADMISSION DATE:  09/02/2019, CONSULTATION DATE:  09/03/19 REFERRING MD:  Regenia Skeeter, CHIEF COMPLAINT:  Light-headedness   Brief History   64 year old male who has a history of A. fib/a flutter and type 2 diabetes who presented with lightheadedness and generalized weakness and found to be in significantly hyperglycemic with bradycardia.  PCCM consulted for admission secondary to heart rate in the 30s.  History of present illness   64 year old male with a past medical history significant of atrial fibrillation/flutter status post ablation and multiple cardioversions on Pradaxa, HFrEF EF of 35 to 40%, asthma, type 2 diabetes, hyperlipidemia, hypertension and OSA who woke up today feeling lightheaded and with weakness in both of his legs.  He states he "fell" after his legs gave out and he sat down in a chair, did not hit his head.  He notes some transient left hand tingling, no focal extremity weakness, along with intermittent blurred vision.  He does not usually check his blood sugar.  He is on glipizide at home.  Chest pain, chest pressure, recent fever or illness.  He follows with cardiology and had a nuclear stress test in 03/2019 without clear evidence of blockage.  He has been taking amiodarone, carvedilol, hydralazine, and Imdur as directed and reports no recent increases in dose.  The emergency department, glucose was greater than 1000, CO2 20 gap of 17 and pH 7.2.  He was also bradycardic with heart rate 30s-40s and junctional rhythm without hypotension.  Potassium was 6.1 and he was given 2 g of calcium gluconate and started on an insulin gtt. work-up also significant for an acute on chronic kidney injury with creatinine of 3.2 (baseline 1.4-1.7).  Corrected sodium is 130,  Past Medical History   has a past medical history of Allergy, Asthma, Atrial fibrillation ([place]), Atrial flutter ([place]), Cardiac arrhythmia due to congenital  heart disease, Cataract, Chronic rhinitis, Chronic systolic dysfunction of left ventricle, Clotting disorder ([place]), Colon polyp, Congestive heart failure ([place]), Diabetes mellitus without complication ([place]), Diverticulosis, Hyperlipidemia, Hypertension, Hypertension, Morbid obesity ([place]), Nonischemic cardiomyopathy ([place]), and Sleep apnea.   Significant Hospital Events   3/20 admit to PCCM  Consults:    Procedures:    Significant Diagnostic Tests:  3/20 CXR>> pending  Micro Data:  3/28 RVP>> pending  Antimicrobials:     Interim history/subjective:  Bradycardia transiently improved in the ED, then back to a rate of 30s and 40s  Objective   Blood pressure 105/76, pulse 62, temperature 97.6 F (36.4 C), temperature source Oral, resp. rate 13, height 6\' 1"  (1.854 m), weight 129.3 kg, SpO2 94 %.       No intake or output data in the 24 hours ending 09/03/19 0247 Filed Weights   09/02/19 2129  Weight: 129.3 kg    General: Obese, well-appearing male in no acute distress HEENT: MM pink/moist Neuro: Awake, alert and oriented x3, 5 out of 5 strength in all extremities, no sensation deficit, no pronator drift and cranial nerves grossly intact CV: s1s2 RRR, no m/r/g PULM: CTAB GI: soft, bsx4 active  Extremities: warm/dry, no edema  Skin: no rashes or lesions  Resolved Hospital Problem list     Assessment & Plan:   Profound hyperglycemia, HHS versus developing DKA -Glucose greater than 1000, gap of 17, CO2 20 however no ketones on UA, serum Osms's are pending P: -Continue insulin gtt. per Endo tool, received 1 L of LR and then normal saline  at 75 cc an hour -Continue fluid resuscitation and monitor for volume overload in the setting of chronic heart failure -Repeat BMP every 4 hours, check VBG -Hold glipizide, may need discharged home on insulin, check A1c -N.p.o. -No evidence of acute infection precipitating hyperglycemia   Transient L hand tingling and blurred  vision -fully resolved in the ED and suspect this is secondary to the hyperglycemia, if re-occurs or further neuro deficits consider head CT   Bradycardia in the setting of persistent atrial fibrillation/a flutter  -Status post ablation many years ago and multiple cardioversions -Anticoagulated on Pradaxa -Recent nuclear stress test without signs of occlusion P: -Improved in the ED, possibly precipitated by beta-blocker and amiodarone in the setting of AKI -Check echocardiogram, trend troponin -Blood pressure remaines stable, no indication for atropine or external pacing at this time -Follow mag, Phos, calcium levels -Hold beta-blocker and amiodarone -If unable to correct with the above then consult cardiology   HFrEF, HTN -Last echo 03/2019 with EF of 35 to 40% P: -Holding Imdur and Lasix in the setting of bradycardia and volume depletion -Repeat echocardiogram   Nonoliguric AKI -creatinine of 3.2 (baseline 1.4-1.7) -Suspect this is likely due to prerenal volume secondary to hyperglycemia P: -Monitor creatinine after IV fluids, holding amiodarone and Lasix -Follow urine output   Hypothyroidism -Continue Synthroid and check TSH   OSA -pt uses CPAP at night, will continue  Asthma -Continue home Dulera    Best practice:  Diet: NPO Pain/Anxiety/Delirium protocol (if indicated): n/a VAP protocol (if indicated): n/a DVT prophylaxis: lovenox GI prophylaxis: n/a Glucose control: Insulin gtt. Mobility: With assist Code Status: Full code Family Communication: Patient able to communicate Disposition: ICU  Labs   CBC: Recent Labs  Lab 09/02/19 2142 09/02/19 2326 09/02/19 2332  WBC 7.0  --   --   HGB 15.1 16.3 14.3  HCT 44.2 48.0 42.0  MCV 91.7  --   --   PLT 196  --   --     Basic Metabolic Panel: Recent Labs  Lab 09/02/19 2142 09/02/19 2326 09/02/19 2332 09/02/19 2333  NA 113* 113* 113*  --   K 6.1* 5.5* 5.6*  --   CL 76*  --   --   --   CO2 20*   --   --   --   GLUCOSE 1,186*  --   --   --   BUN 62*  --   --   --   CREATININE 3.24* 3.00*  --  2.80*  CALCIUM 8.9  --   --   --    GFR: Estimated Creatinine Clearance: 37.6 mL/min (A) (by C-G formula based on SCr of 2.8 mg/dL (H)). Recent Labs  Lab 09/02/19 2142  WBC 7.0    Liver Function Tests: No results for input(s): AST, ALT, ALKPHOS, BILITOT, PROT, ALBUMIN in the last 168 hours. No results for input(s): LIPASE, AMYLASE in the last 168 hours. No results for input(s): AMMONIA in the last 168 hours.  ABG    Component Value Date/Time   PHART 7.435 03/14/2009 1020   PCO2ART 36.1 03/14/2009 1020   PO2ART 52.0 (L) 03/14/2009 1020   HCO3 18.3 (L) 09/02/2019 2332   TCO2 19 (L) 09/02/2019 2332   ACIDBASEDEF 7.0 (H) 09/02/2019 2332   O2SAT 84.0 09/02/2019 2332     Coagulation Profile: No results for input(s): INR, PROTIME in the last 168 hours.  Cardiac Enzymes: No results for input(s): CKTOTAL, CKMB, CKMBINDEX, TROPONINI in the last 168  hours.  HbA1C: Hgb A1c MFr Bld  Date/Time Value Ref Range Status  03/08/2019 09:46 AM 6.9 (H) 4.6 - 6.5 % Final    Comment:    Glycemic Control Guidelines for People with Diabetes:Non Diabetic:  <6%Goal of Therapy: <7%Additional Action Suggested:  >8%   01/31/2019 10:34 PM 6.7 (H) 4.8 - 5.6 % Final    Comment:    (NOTE) Pre diabetes:          5.7%-6.4% Diabetes:              >6.4% Glycemic control for   <7.0% adults with diabetes     CBG: Recent Labs  Lab 09/02/19 2139 09/02/19 2206 09/02/19 2343 09/03/19 0055 09/03/19 0218  GLUCAP >600* >600* >600* >600* >600*    Review of Systems:   Negative except as noted in HPI  Past Medical History  He,  has a past medical history of Allergy, Asthma, Atrial fibrillation ([place]), Atrial flutter ([place]), Cardiac arrhythmia due to congenital heart disease, Cataract, Chronic rhinitis, Chronic systolic dysfunction of left ventricle, Clotting disorder ([place]), Colon polyp, Congestive heart  failure ([place]), Diabetes mellitus without complication ([place]), Diverticulosis, Hyperlipidemia, Hypertension, Hypertension, Morbid obesity ([place]), Nonischemic cardiomyopathy ([place]), and Sleep apnea.   Surgical History    Past Surgical History:  Procedure Laterality Date  . ATRIAL FIBRILLATION ABLATION N/A 12/14/2018   Procedure: ATRIAL FIBRILLATION ABLATION;  Surgeon: Thompson Grayer, MD;  Location: [place] [facility];  Service: Cardiovascular;  Laterality: N/A;  . atrial flutter ablation  08/06/10  . CARDIOVERSION N/A 07/05/2018   Procedure: CARDIOVERSION;  Surgeon: Buford Dresser, MD;  Location: [facility] ENDOSCOPY;  Service: Cardiovascular;  Laterality: N/A;  . COLONOSCOPY    . POLYPECTOMY    . TEE WITHOUT CARDIOVERSION N/A 12/14/2018   Procedure: TRANSESOPHAGEAL ECHOCARDIOGRAM (TEE);  Surgeon: Thompson Grayer, MD;  Location: [place];  Service: Cardiovascular;  Laterality: N/A;     Social History   reports that he has never smoked. He has never used smokeless tobacco. He reports current alcohol use of about 21.0 standard drinks of alcohol per week. He reports that he does not use drugs.   Family History   His family history includes COPD in his father; Colon cancer in his brother; Hypertension in his mother; Kidney failure in his mother. There is no history of Esophageal cancer, Rectal cancer, or Stomach cancer.   Allergies No Known Allergies   Home Medications  Prior to Admission medications   Medication Sig Start Date End Date Taking? Authorizing Provider  albuterol (PROVENTIL HFA;VENTOLIN HFA) 108 (90 Base) MCG/ACT inhaler Inhale 2 puffs into the lungs every 6 (six) hours as needed for wheezing or shortness of breath.   Yes [provider]  amiodarone (PACERONE) 200 MG tablet Take 1 tablet (200 mg total) by mouth daily. 03/30/19  Yes Allred, Jeneen Rinks, MD  carvedilol (COREG) 6.25 MG tablet Take 1 tablet (6.25 mg total) by mouth 2 (two) times daily with a meal. 03/24/19  Yes  Buford Dresser, MD  doxylamine, Sleep, (UNISOM) 25 MG tablet Take 50 mg by mouth at bedtime as needed for sleep.    Yes [provider]  furosemide (LASIX) 40 MG tablet TAKE 1 TABLET TWICE DAILY Patient taking differently: Take 40 mg by mouth 2 (two) times daily.  06/20/19  Yes [name], MD  gabapentin (NEURONTIN) 300 MG capsule TAKE 1 CAPSULE BY MOUTH THREE TIMES A DAY Patient taking differently: Take 600 mg by mouth in the morning and at bedtime.  05/02/19  Yes [name], MD  glipiZIDE (GLUCOTROL) 5 MG tablet TAKE 1 TABLET BY MOUTH TWICE A DAY BEFORE MEALS Patient taking differently: Take 5 mg by mouth 2 (two) times daily before a meal.  08/17/19  Yes [name], MD  hydrALAZINE (APRESOLINE) 50 MG tablet Take 1 tablet (50 mg total) by mouth 2 (two) times daily. 03/24/19  Yes Buford Dresser, MD  isosorbide mononitrate (IMDUR) 30 MG 24 hr tablet TAKE 1 TABLET BY MOUTH EVERY DAY Patient taking differently: Take 30 mg by mouth in the morning.  04/01/19  Yes Buford Dresser, MD  levothyroxine (SYNTHROID) 50 MCG tablet TAKE 1 TABLET EVERY DAY BEFORE BREAKFAST Patient taking differently: Take 50 mcg by mouth daily before breakfast.  09/01/19  Yes [name], MD  losartan (COZAAR) 100 MG tablet Take 100 mg by mouth every morning.  05/15/19  Yes [provider]  mometasone (NASONEX) 50 MCG/ACT nasal spray Place 2 sprays into the nose daily as needed (allergies or rhinitis).    Yes [provider]  PRADAXA 150 MG CAPS capsule TAKE 1 CAPSULE BY MOUTH TWICE A DAY Patient taking differently: Take 150 mg by mouth 2 (two) times daily.  06/01/19  Yes Allred, Jeneen Rinks, MD  rosuvastatin (CRESTOR) 10 MG tablet TAKE 1 TABLET BY MOUTH EVERY DAY Patient taking differently: Take 10 mg by mouth at bedtime.  06/06/19  Yes Buford Dresser, MD  spironolactone (ALDACTONE) 25 MG tablet Take 25 mg by mouth in the morning.    Yes  [provider]  SYMBICORT 80-4.5 MCG/ACT inhaler USE 2 INHALATIONS TWICE A DAY Patient taking differently: Inhale 2 puffs into the lungs 2 (two) times daily as needed (for flares).  02/12/17  Yes [name], MD  traMADol (ULTRAM) 50 MG tablet Take 50 mg by mouth every 6 (six) hours as needed (for pain).  05/02/19  Yes [provider]  vitamin C (ASCORBIC ACID) 500 MG tablet Take 500-1,000 mg by mouth daily.   Yes [provider]  carvedilol (COREG) 3.125 MG tablet TAKE 1 TABLET TWICE DAILY Patient not taking: Reported on 09/02/2019 06/08/19   [name], MD     Critical care time: 60 minutes     CRITICAL CARE Performed by: Otilio Carpen Mauri Tolen   Total critical care time: 60 minutes  Critical care time was exclusive of separately billable procedures and treating other patients.  Critical care was necessary to treat or prevent imminent or life-threatening deterioration.  Critical care was time spent personally by me on the following activities: development of treatment plan with patient and/or surrogate as well as nursing, discussions with consultants, evaluation of patient's response to treatment, examination of patient, obtaining history from patient or surrogate, ordering and performing treatments and interventions, ordering and review of laboratory studies, ordering and review of radiographic studies, pulse oximetry and re-evaluation of patient's condition.  Otilio Carpen Shanon Seawright, PA-C

## 2019-09-03 NOTE — Progress Notes (Signed)
eLink Physician-Brief Progress Note Patient Name: [name] DOB: February 25, 1956 MRN: [name]   Date of Service  09/03/2019  HPI/Events of Note  Pt admitted through the ED with uncontrolled diabetes mellitus, AKI and altered mental status.  eICU Interventions  New Patient Evaluation completed.        Kerry Kass Shilynn Hoch 09/03/2019, 2:12 AM

## 2019-09-03 NOTE — Progress Notes (Signed)
NAME:  ADALBERTO HARTSELL, MRN:  [name], DOB:  [birth year], LOS: 0 ADMISSION DATE:  09/02/2019, CONSULTATION DATE:  09/03/19 REFERRING MD:  Regenia Skeeter, CHIEF COMPLAINT:  Light-headedness   Brief History   64 year old male who has a history of A. fib/a flutter and type 2 diabetes who presented with lightheadedness and generalized weakness and found to be in significantly hyperglycemic with bradycardia.  PCCM consulted for admission secondary to heart rate in the 30s.  History of present illness   64 year old male with a past medical history significant of atrial fibrillation/flutter status post ablation and multiple cardioversions on Pradaxa, HFrEF EF of 35 to 40%, asthma, type 2 diabetes, hyperlipidemia, hypertension and OSA who woke up today feeling lightheaded and with weakness in both of his legs.  He states he "fell" after his legs gave out and he sat down in a chair, did not hit his head.  He notes some transient left hand tingling, no focal extremity weakness, along with intermittent blurred vision.  He does not usually check his blood sugar.  He is on glipizide at home.  Chest pain, chest pressure, recent fever or illness.  He follows with cardiology and had a nuclear stress test in 03/2019 without clear evidence of blockage.  He has been taking amiodarone, carvedilol, hydralazine, and Imdur as directed and reports no recent increases in dose.  The emergency department, glucose was greater than 1000, CO2 20 gap of 17 and pH 7.2.  He was also bradycardic with heart rate 30s-40s and junctional rhythm without hypotension.  Potassium was 6.1 and he was given 2 g of calcium gluconate and started on an insulin gtt. work-up also significant for an acute on chronic kidney injury with creatinine of 3.2 (baseline 1.4-1.7).  Corrected sodium is 130,  Past Medical History   has a past medical history of Allergy, Asthma, Atrial fibrillation ([place]), Atrial flutter ([place]), Cardiac arrhythmia due to congenital  heart disease, Cataract, Chronic rhinitis, Chronic systolic dysfunction of left ventricle, Clotting disorder ([place]), Colon polyp, Congestive heart failure ([place]), Diabetes mellitus without complication ([place]), Diverticulosis, Hyperlipidemia, Hypertension, Hypertension, Morbid obesity ([place]), Nonischemic cardiomyopathy ([place]), and Sleep apnea.  Significant Hospital Events   3/20 admit to PCCM  Consults:  Cardiology  Procedures:    Significant Diagnostic Tests:  3/20 CXR>> pending  Micro Data:  3/28 RVP>> negative  Antimicrobials:     Interim history/subjective:  Started on dopamine overnight with improved HR  Objective   Blood pressure 121/60, pulse (!) 51, temperature 97.6 F (36.4 C), temperature source Oral, resp. rate 16, height 6\' 1"  (1.854 m), weight (!) 136.1 kg, SpO2 97 %.        Intake/Output Summary (Last 24 hours) at 09/03/2019 1042 Last data filed at 09/03/2019 0900 Gross per 24 hour  Intake 2001.64 ml  Output 1200 ml  Net 801.64 ml   Filed Weights   09/02/19 2129 09/03/19 0327  Weight: 129.3 kg (!) 136.1 kg   Physical Exam: General: Obese, well-appearing, no acute distress HENT: [place], AT, OP clear, MMM Eyes: EOMI, no scleral icterus Respiratory: Clear to auscultation bilaterally.  No crackles, wheezing or rales Cardiovascular: Bradycardic, -M/R/G, no JVD GI: BS+, soft, nontender Extremities:-Edema,-tenderness Neuro: AAO x4, CNII-XII grossly intact Skin: Intact, no rashes or bruising Psych: Normal mood, normal affect  Resolved Hospital Problem list     Assessment & Plan:   Hyperosmolar hyperglycemic state P: -Continue insulin gtt and IVF per Endo tool -Repeat BMP every 4 hours -NPO  Bradycardia in the setting  of persistent atrial fibrillation/a flutter  -Status post ablation many years ago and multiple cardioversions -Anticoagulated on Pradaxa -Recent nuclear stress test without signs of occlusion P: -Consulted Cardiology -Continue low-dose  dopamine -Check echocardiogram, trend troponin -Hold beta-blocker and amiodarone  HFrEF, HTN -Last echo 03/2019 with EF of 35 to 40% P: -Holding Imdur and Lasix in the setting of bradycardia and volume depletion -Repeat echocardiogram  Nonoliguric AKI -improving -creatinine of 3.2 (baseline 1.4-1.7) -Suspect this is likely due to prerenal volume secondary to hyperglycemia P: -Monitor creatinine after IV fluids, holding amiodarone and Lasix -Follow urine output  Hypothyroidism Increased TSH and low T4 P: -Restarted Synthroid   OSA -pt uses CPAP at night, will continue  Asthma -Continue home Dulera  Best practice:  Diet: NPO Pain/Anxiety/Delirium protocol (if indicated): n/a VAP protocol (if indicated): n/a DVT prophylaxis: lovenox GI prophylaxis: n/a Glucose control: Insulin gtt. Mobility: With assist Code Status: Full code Family Communication: Updated patient at bedside Disposition: ICU  Labs   CBC: Recent Labs  Lab 09/02/19 2142 09/02/19 2326 09/02/19 2332 09/03/19 0329  WBC 7.0  --   --  8.0  HGB 15.1 16.3 14.3 14.9  HCT 44.2 48.0 42.0 41.3  MCV 91.7  --   --  85.9  PLT 196  --   --  [id number]    Basic Metabolic Panel: Recent Labs  Lab 09/02/19 2142 09/02/19 2326 09/02/19 2332 09/02/19 2333 09/03/19 0329 09/03/19 0807  NA 113* 113* 113*  --  124* 129*  K 6.1* 5.5* 5.6*  --  3.9 3.9  CL 76*  --   --   --  88* 95*  CO2 20*  --   --   --  23 24  GLUCOSE 1,186*  --   --   --  551* 324*  BUN 62*  --   --   --  54* 46*  CREATININE 3.24* 3.00*  --  2.80* 2.71* 2.28*  CALCIUM 8.9  --   --   --  9.4 9.3  MG  --   --   --   --  2.4  --   PHOS  --   --   --   --  4.1  --    GFR: Estimated Creatinine Clearance: 47.4 mL/min (A) (by C-G formula based on SCr of 2.28 mg/dL (H)). Recent Labs  Lab 09/02/19 2142 09/03/19 0329  WBC 7.0 8.0    Liver Function Tests: No results for input(s): AST, ALT, ALKPHOS, BILITOT, PROT, ALBUMIN in the last 168  hours. No results for input(s): LIPASE, AMYLASE in the last 168 hours. No results for input(s): AMMONIA in the last 168 hours.  ABG    Component Value Date/Time   PHART 7.435 03/14/2009 1020   PCO2ART 36.1 03/14/2009 1020   PO2ART 52.0 (L) 03/14/2009 1020   HCO3 18.3 (L) 09/02/2019 2332   TCO2 19 (L) 09/02/2019 2332   ACIDBASEDEF 7.0 (H) 09/02/2019 2332   O2SAT 84.0 09/02/2019 2332     Coagulation Profile: No results for input(s): INR, PROTIME in the last 168 hours.  Cardiac Enzymes: No results for input(s): CKTOTAL, CKMB, CKMBINDEX, TROPONINI in the last 168 hours.  HbA1C: Hgb A1c MFr Bld  Date/Time Value Ref Range Status  03/08/2019 09:46 AM 6.9 (H) 4.6 - 6.5 % Final    Comment:    Glycemic Control Guidelines for People with Diabetes:Non Diabetic:  <6%Goal of Therapy: <7%Additional Action Suggested:  >8%   01/31/2019 10:34 PM 6.7 (H) 4.8 -  5.6 % Final    Comment:    (NOTE) Pre diabetes:          5.7%-6.4% Diabetes:              >6.4% Glycemic control for   <7.0% adults with diabetes     CBG: Recent Labs  Lab 09/03/19 0559 09/03/19 0632 09/03/19 0650 09/03/19 0818 09/03/19 0933  GLUCAP 365* 281* 325* 366* 323*    Critical care time: 34 minutes    The patient is critically ill with multiple organ systems failure and requires high complexity decision making for assessment and support, frequent evaluation and titration of therapies, application of advanced monitoring technologies and extensive interpretation of multiple databases.   Critical Care Time devoted to patient care services described in this note is 36 Minutes.   Rodman Pickle, M.D. [facility] Pulmonary/Critical Care Medicine 09/03/2019 10:43 AM   Please see Amion for pager number to reach on-call Pulmonary and Critical Care Team.

## 2019-09-03 NOTE — Consult Note (Signed)
Cardiology Consultation:   Patient ID: [name]; [name]; 02-04-1956   Admit date: 09/02/2019 Date of Consult: 09/03/2019  Primary Care Provider: [name], MD Primary Cardiologist: Buford Dresser, MD Primary Electrophysiologist: Thompson Grayer, MD   Patient Profile:   [name] is a 64 y.o. male with a history of persistent atrial fibrillation, atrial flutter status post ablation in 2012, hypertension, tachycardia mediated cardiomyopathy, type 2 diabetes mellitus, and OSA on CPAP who is being seen today for the evaluation of bradycardia at the request of Dr. Loanne Drilling.  History of Present Illness:   [name] presents to the hospital with dizziness and weakness, found to have DKA and also bradycardia.  Initial glucose greater than 1000 in the ER with anion gap of 17 and pH 7.2.  Heart rate reportedly junctional rhythm initially in the 30s and 40s but not hypotensive.  His potassium was also elevated at 6.1, he was treated with calcium gluconate and is currently on an insulin drip as well as low-dose dopamine with heart rate in the 50s in sinus rhythm by telemetry.  As an outpatient he is on amiodarone 200 mg daily and Coreg 3.125 mg twice daily (some question as to whether he has been taking this regularly). He is typically on Pradaxa for stroke prophylaxis.  No other AV nodal blockers.  Most recent echocardiogram in October 2020 showed LVEF 35 to 40%.  Myoview at that time reported LVEF 49% with dilated LV but no ischemic defects, apical thinning.  He has been managed medically for a probable nonischemic cardiomyopathy (potentially tachycardia-mediated).  He does not describe any sudden syncope, did have a fall when he was dizzy.  No loss of consciousness reported.  Past Medical History:  Diagnosis Date  . Asthma    as a teenager - does not use an inhaler although pt said he has an albuteral inhaler  . Atrial fibrillation ([place])    persistant 02/2009    . Atrial flutter ([place])    s/p CTI ablation 08/06/10  . Cataract   . Chronic rhinitis   . Colon polyp   . Congestive heart failure ([place])   . Diverticulosis    colonoscopy 04/03/2009  . Hyperlipidemia   . Hypertension   . Morbid obesity ([place])    target weight = 219  for BMI < 30  . Nonischemic cardiomyopathy ([facility])    tachycardia mediated  . Seasonal allergies   . Sleep apnea    original 2001 - wears C-PaP  . Type 2 diabetes mellitus ([place])     Past Surgical History:  Procedure Laterality Date  . ATRIAL FIBRILLATION ABLATION N/A 12/14/2018   Procedure: ATRIAL FIBRILLATION ABLATION;  Surgeon: Thompson Grayer, MD;  Location: [facility];  Service: Cardiovascular;  Laterality: N/A;  . atrial flutter ablation  08/06/10  . CARDIOVERSION N/A 07/05/2018   Procedure: CARDIOVERSION;  Surgeon: Buford Dresser, MD;  Location: [facility] ENDOSCOPY;  Service: Cardiovascular;  Laterality: N/A;  . COLONOSCOPY    . POLYPECTOMY    . TEE WITHOUT CARDIOVERSION N/A 12/14/2018   Procedure: TRANSESOPHAGEAL ECHOCARDIOGRAM (TEE);  Surgeon: Thompson Grayer, MD;  Location: [facility];  Service: Cardiovascular;  Laterality: N/A;     Inpatient Medications: Scheduled Meds: . atropine      . Chlorhexidine Gluconate Cloth  6 each Topical Q0600  . dabigatran  150 mg Oral BID  . gabapentin  600 mg Oral BID  . levothyroxine  50 mcg Oral QAC breakfast  . mouth  rinse  15 mL Mouth Rinse BID  . mometasone-formoterol  2 puff Inhalation BID  . rosuvastatin  10 mg Oral q1800  . sodium chloride flush  3 mL Intravenous Once   Continuous Infusions: . sodium chloride 75 mL/hr at 09/03/19 0800  . dextrose 5 % and 0.45% NaCl    . DOPamine 2.5 mcg/kg/min (09/03/19 0800)  . insulin 8.5 Units/hr (09/03/19 0936)   PRN Meds: albuterol, dextrose, doxylamine (Sleep), traMADol  Allergies:   No Known Allergies  Social History:   Social History   Socioeconomic History  . Marital status: Single    Spouse name:  Not on file  . Number of children: 0  . Years of education: Not on file  . Highest education level: Not on file  Occupational History  . Occupation: Sales  Tobacco Use  . Smoking status: Never Smoker  . Smokeless tobacco: Never Used  Substance and Sexual Activity  . Alcohol use: Yes    Alcohol/week: 21.0 standard drinks    Types: 21 Cans of beer per week    Comment: previously quite heavy, working on cessation   . Drug use: No  . Sexual activity: Not on file  Other Topics Concern  . Not on file  Social History Narrative   Lives in [place] and works for [employer]   Social Determinants of Health   Financial Resource Strain:   . Difficulty of Paying Living Expenses:   Food Insecurity:   . Worried About Charity fundraiser in the Last Year:   . Arboriculturist in the Last Year:   Transportation Needs:   . Film/video editor (Medical):   [name] Lack of Transportation (Non-Medical):   Physical Activity:   . Days of Exercise per Week:   . Minutes of Exercise per Session:   Stress:   . Feeling of Stress :   Social Connections:   . Frequency of Communication with Friends and Family:   . Frequency of Social Gatherings with Friends and Family:   . Attends Religious Services:   . Active Member of Clubs or Organizations:   . Attends Archivist Meetings:   [name] Marital Status:   Intimate Partner Violence:   . Fear of Current or Ex-Partner:   . Emotionally Abused:   [name] Physically Abused:   . Sexually Abused:     Family History:   The patient's family history includes COPD in his father; Colon cancer in his brother; Hypertension in his mother; Kidney failure in his mother. There is no history of Esophageal cancer, Rectal cancer, or Stomach cancer.  ROS:  Please see the history of present illness.  All other ROS reviewed and negative.     Physical Exam/Data:   Vitals:   09/03/19 0800 09/03/19 0822 09/03/19 0906 09/03/19 1000  BP: 123/75  132/78 121/60  Pulse: (!)  51  (!) 49 (!) 51  Resp: 16  (!) 21 16  Temp:  97.6 F (36.4 C)    TempSrc:  Oral    SpO2: 96%  97% 97%  Weight:      Height:        Intake/Output Summary (Last 24 hours) at 09/03/2019 1118 Last data filed at 09/03/2019 0900 Gross per 24 hour  Intake 2001.64 ml  Output 1200 ml  Net 801.64 ml   Filed Weights   09/02/19 2129 09/03/19 0327  Weight: 129.3 kg (!) 136.1 kg   Body mass index is 39.59 kg/m.   Gen: Morbidly  obese male, no distress. HEENT: Conjunctiva and lids normal, oropharynx clear. Neck: Supple, increased girth, no obvious elevated JVP, no thyromegaly. Lungs: Clear to auscultation, nonlabored breathing at rest. Cardiac: Distant, RRR, no S3 or significant systolic murmur, no pericardial rub. Abdomen: Soft, nontender, bowel sounds present. Extremities: No pitting edema, distal pulses 2+. Skin: Warm and dry. Musculoskeletal: No kyphosis. Neuropsychiatric: Alert and oriented x3, affect grossly appropriate.  EKG:  An ECG dated 09/02/2019 was personally reviewed today and demonstrated:  Possible junctional rhythm with right bundle branch block and left anterior fascicular block, cannot rule out higher degree heart block with some P wave activity noted.  Telemetry:  I personally reviewed telemetry which shows sinus bradycardia.  Relevant CV Studies:  Lexiscan Myoview 04/08/2019:  Nuclear stress EF: 49%.  The left ventricular ejection fraction is mildly decreased (45-54%).  No T wave inversion was noted during stress.  There was no ST segment deviation noted during stress.  Defect 1: There is a small defect of mild severity.  This is an intermediate risk study.   Small size, mild intensity fixed apical perfusion defect, likely thinning. No significant reversible ischemia. Dilated LV with mild hypokinesis - LVEF 49%. This is an intermediate risk study due to decreased LV function.  Echocardiogram 03/29/2019: 1. Left ventricular ejection fraction, by visual  estimation, is 35 to  40%. The left ventricle has moderate to severely decreased function.  Normal left ventricular size. There is mildly increased left ventricular  hypertrophy.  2. Left ventricular diastolic Doppler parameters are consistent with  impaired relaxation pattern of LV diastolic filling.  3. Global right ventricle has normal systolic function.The right  ventricular size is mildly enlarged. No increase in right ventricular wall  thickness.  4. Left atrial size was normal.  5. Right atrial size was normal.  6. The mitral valve is normal in structure. Mild mitral valve  regurgitation. No evidence of mitral stenosis.  7. The tricuspid valve is normal in structure. Tricuspid valve  regurgitation was not visualized by color flow Doppler.  8. The aortic valve is normal in structure. Aortic valve regurgitation  was not visualized by color flow Doppler. Structurally normal aortic  valve, with no evidence of sclerosis or stenosis.  9. The pulmonic valve was normal in structure. Pulmonic valve  regurgitation is not visualized by color flow Doppler.  10. The inferior vena cava is normal in size with greater than 50%  respiratory variability, suggesting right atrial pressure of 3 mmHg.   Laboratory Data:  Chemistry Recent Labs  Lab 09/02/19 2142 09/02/19 2326 09/02/19 2332 09/02/19 2333 09/03/19 0329 09/03/19 0807  NA 113*   < > 113*  --  124* 129*  K 6.1*   < > 5.6*  --  3.9 3.9  CL 76*  --   --   --  88* 95*  CO2 20*  --   --   --  23 24  GLUCOSE 1,186*  --   --   --  551* 324*  BUN 62*  --   --   --  54* 46*  CREATININE 3.24*   < >  --  2.80* 2.71* 2.28*  CALCIUM 8.9  --   --   --  9.4 9.3  GFRNONAA 19*  --   --   --  24* 29*  GFRAA 22*  --   --   --  27* 34*  ANIONGAP 17*  --   --   --  13 10   < > =  values in this interval not displayed.    No results for input(s): PROT, ALBUMIN, AST, ALT, ALKPHOS, BILITOT in the last 168 hours. Hematology Recent Labs    Lab 09/02/19 2142 09/02/19 2142 09/02/19 2326 09/02/19 2332 09/03/19 0329  WBC 7.0  --   --   --  8.0  RBC 4.82  --   --   --  4.81  HGB 15.1   < > 16.3 14.3 14.9  HCT 44.2   < > 48.0 42.0 41.3  MCV 91.7  --   --   --  85.9  MCH 31.3  --   --   --  31.0  MCHC 34.2  --   --   --  36.1*  RDW 13.8  --   --   --  12.7  PLT 196  --   --   --  182   < > = values in this interval not displayed.   Cardiac Enzymes Recent Labs  Lab 09/03/19 0006 09/03/19 0329  TROPONINIHS 61* 84*    Radiology/Studies:  DG CHEST PORT 1 VIEW  Result Date: 09/03/2019 CLINICAL DATA:  Diabetic ketoacidosis EXAM: PORTABLE CHEST 1 VIEW COMPARISON:  February 22, 2019 FINDINGS: The heart size and mediastinal contours are within normal limits. Both lungs are clear. The visualized skeletal structures are unremarkable. IMPRESSION: No active disease. Electronically Signed   By: Dorise Bullion III M.D   On: 09/03/2019 08:32    Assessment and Plan:   1.  Symptomatic bradycardia, presenting with possible junctional rhythm, right bundle branch block and left anterior fascicular block, although cannot rule out higher degree heart block with some P wave activity noted.  This is in the setting of DKA with electrolyte abnormalities including hyperkalemia.  He is on amiodarone as an outpatient, also possibly low-dose Coreg.  At present he is in sinus rhythm in the 50s on low-dose dopamine 2.5 mcg.  Otherwise hemodynamically stable.  2.  Persistent atrial fibrillation by history as well as previous atrial flutter ablation, discussed above.  He has been managed with rhythm control strategy on amiodarone, low-dose beta-blocker, and Pradaxa as an outpatient.  He does not report any recent palpitations or syncope.  3.  History of nonischemic cardiomyopathy, possibly tachycardia-mediated.  LVEF approximately 40% by last assessment.  Follow-up echocardiogram has been ordered.  4.  Hypothyroidism, on Synthroid as an outpatient.   Current TSH is 33, question whether also contributor to his bradycardia.  5.  OSA on CPAP.  He reports compliance.  I reviewed the patient's records and current chart.  He last saw Dr. Harrell Gave in December 2020.  At this point I agree with holding both Coreg and amiodarone, hopefully as his blood sugars and electrolyte abnormalities stabilize he can also be taken off of the dopamine as well with follow-up of heart rate response.  At present he is in sinus bradycardia in the 50s.  Would follow-up ECG, will also review echocardiogram results.  Continue anticoagulation.  Signed, Rozann Lesches, MD  09/03/2019 11:18 AM

## 2019-09-03 NOTE — Progress Notes (Signed)
eLink Physician-Brief Progress Note Patient Name: [name] DOB: Feb 01, 1956 MRN: [name]   Date of Service  09/03/2019  HPI/Events of Note  Pt is hypotensive and bradycardic.  eICU Interventions  1000 ml LR iv fluid bolus, Dopamine at a fixed dose of  2.5 mcg/ kg/min        Daianna Vasques U Keyosha Tiedt 09/03/2019, 3:03 AM

## 2019-09-04 ENCOUNTER — Inpatient Hospital Stay (HOSPITAL_COMMUNITY): Payer: BC Managed Care – PPO

## 2019-09-04 DIAGNOSIS — N179 Acute kidney failure, unspecified: Secondary | ICD-10-CM | POA: Diagnosis not present

## 2019-09-04 DIAGNOSIS — R739 Hyperglycemia, unspecified: Secondary | ICD-10-CM | POA: Diagnosis not present

## 2019-09-04 DIAGNOSIS — I5042 Chronic combined systolic (congestive) and diastolic (congestive) heart failure: Secondary | ICD-10-CM | POA: Diagnosis not present

## 2019-09-04 DIAGNOSIS — E1110 Type 2 diabetes mellitus with ketoacidosis without coma: Secondary | ICD-10-CM

## 2019-09-04 DIAGNOSIS — R001 Bradycardia, unspecified: Secondary | ICD-10-CM

## 2019-09-04 DIAGNOSIS — I4819 Other persistent atrial fibrillation: Secondary | ICD-10-CM | POA: Diagnosis not present

## 2019-09-04 DIAGNOSIS — Z20822 Contact with and (suspected) exposure to covid-19: Secondary | ICD-10-CM | POA: Diagnosis not present

## 2019-09-04 DIAGNOSIS — I130 Hypertensive heart and chronic kidney disease with heart failure and stage 1 through stage 4 chronic kidney disease, or unspecified chronic kidney disease: Secondary | ICD-10-CM | POA: Diagnosis not present

## 2019-09-04 DIAGNOSIS — E1100 Type 2 diabetes mellitus with hyperosmolarity without nonketotic hyperglycemic-hyperosmolar coma (NKHHC): Secondary | ICD-10-CM | POA: Diagnosis not present

## 2019-09-04 DIAGNOSIS — I428 Other cardiomyopathies: Secondary | ICD-10-CM | POA: Diagnosis not present

## 2019-09-04 DIAGNOSIS — I5022 Chronic systolic (congestive) heart failure: Secondary | ICD-10-CM | POA: Diagnosis not present

## 2019-09-04 LAB — BASIC METABOLIC PANEL
Anion gap: 10 (ref 5–15)
Anion gap: 8 (ref 5–15)
Anion gap: 8 (ref 5–15)
Anion gap: 9 (ref 5–15)
BUN: 27 mg/dL — ABNORMAL HIGH (ref 8–23)
BUN: 29 mg/dL — ABNORMAL HIGH (ref 8–23)
BUN: 29 mg/dL — ABNORMAL HIGH (ref 8–23)
BUN: 33 mg/dL — ABNORMAL HIGH (ref 8–23)
CO2: 22 mmol/L (ref 22–32)
CO2: 23 mmol/L (ref 22–32)
CO2: 25 mmol/L (ref 22–32)
CO2: 27 mmol/L (ref 22–32)
Calcium: 8.6 mg/dL — ABNORMAL LOW (ref 8.9–10.3)
Calcium: 8.8 mg/dL — ABNORMAL LOW (ref 8.9–10.3)
Calcium: 8.9 mg/dL (ref 8.9–10.3)
Calcium: 9.3 mg/dL (ref 8.9–10.3)
Chloride: 100 mmol/L (ref 98–111)
Chloride: 101 mmol/L (ref 98–111)
Chloride: 97 mmol/L — ABNORMAL LOW (ref 98–111)
Chloride: 98 mmol/L (ref 98–111)
Creatinine, Ser: 1.71 mg/dL — ABNORMAL HIGH (ref 0.61–1.24)
Creatinine, Ser: 1.79 mg/dL — ABNORMAL HIGH (ref 0.61–1.24)
Creatinine, Ser: 1.87 mg/dL — ABNORMAL HIGH (ref 0.61–1.24)
Creatinine, Ser: 1.97 mg/dL — ABNORMAL HIGH (ref 0.61–1.24)
GFR calc Af Amer: 40 mL/min — ABNORMAL LOW (ref >60)
GFR calc Af Amer: 43 mL/min — ABNORMAL LOW (ref >60)
GFR calc Af Amer: 45 mL/min — ABNORMAL LOW (ref >60)
GFR calc Af Amer: 48 mL/min — ABNORMAL LOW (ref >60)
GFR calc non Af Amer: 35 mL/min — ABNORMAL LOW (ref >60)
GFR calc non Af Amer: 37 mL/min — ABNORMAL LOW (ref >60)
GFR calc non Af Amer: 39 mL/min — ABNORMAL LOW (ref >60)
GFR calc non Af Amer: 41 mL/min — ABNORMAL LOW (ref >60)
Glucose, Bld: 126 mg/dL — ABNORMAL HIGH (ref 70–99)
Glucose, Bld: 193 mg/dL — ABNORMAL HIGH (ref 70–99)
Glucose, Bld: 360 mg/dL — ABNORMAL HIGH (ref 70–99)
Glucose, Bld: 432 mg/dL — ABNORMAL HIGH (ref 70–99)
Potassium: 3.2 mmol/L — ABNORMAL LOW (ref 3.5–5.1)
Potassium: 3.3 mmol/L — ABNORMAL LOW (ref 3.5–5.1)
Potassium: 4.2 mmol/L (ref 3.5–5.1)
Potassium: 4.2 mmol/L (ref 3.5–5.1)
Sodium: 128 mmol/L — ABNORMAL LOW (ref 135–145)
Sodium: 130 mmol/L — ABNORMAL LOW (ref 135–145)
Sodium: 134 mmol/L — ABNORMAL LOW (ref 135–145)
Sodium: 136 mmol/L (ref 135–145)

## 2019-09-04 LAB — COMPREHENSIVE METABOLIC PANEL
ALT: 22 U/L (ref 0–44)
AST: 28 U/L (ref 15–41)
Albumin: 3.2 g/dL — ABNORMAL LOW (ref 3.5–5.0)
Alkaline Phosphatase: 66 U/L (ref 38–126)
Anion gap: 6 (ref 5–15)
BUN: 28 mg/dL — ABNORMAL HIGH (ref 8–23)
CO2: 28 mmol/L (ref 22–32)
Calcium: 8.9 mg/dL (ref 8.9–10.3)
Chloride: 101 mmol/L (ref 98–111)
Creatinine, Ser: 1.69 mg/dL — ABNORMAL HIGH (ref 0.61–1.24)
GFR calc Af Amer: 49 mL/min — ABNORMAL LOW (ref >60)
GFR calc non Af Amer: 42 mL/min — ABNORMAL LOW (ref >60)
Glucose, Bld: 155 mg/dL — ABNORMAL HIGH (ref 70–99)
Potassium: 3.3 mmol/L — ABNORMAL LOW (ref 3.5–5.1)
Sodium: 135 mmol/L (ref 135–145)
Total Bilirubin: 1 mg/dL (ref 0.3–1.2)
Total Protein: 6.5 g/dL (ref 6.5–8.1)

## 2019-09-04 LAB — HEMOGLOBIN A1C
Hgb A1c MFr Bld: 14.2 % — ABNORMAL HIGH (ref 4.8–5.6)
Mean Plasma Glucose: 360.84 mg/dL

## 2019-09-04 LAB — GLUCOSE, CAPILLARY
Glucose-Capillary: 222 mg/dL — ABNORMAL HIGH (ref 70–99)
Glucose-Capillary: 175 mg/dL — ABNORMAL HIGH (ref 70–99)
Glucose-Capillary: 153 mg/dL — ABNORMAL HIGH (ref 70–99)
Glucose-Capillary: 129 mg/dL — ABNORMAL HIGH (ref 70–99)
Glucose-Capillary: 126 mg/dL — ABNORMAL HIGH (ref 70–99)
Glucose-Capillary: 142 mg/dL — ABNORMAL HIGH (ref 70–99)
Glucose-Capillary: 147 mg/dL — ABNORMAL HIGH (ref 70–99)
Glucose-Capillary: 171 mg/dL — ABNORMAL HIGH (ref 70–99)
Glucose-Capillary: 148 mg/dL — ABNORMAL HIGH (ref 70–99)
Glucose-Capillary: 171 mg/dL — ABNORMAL HIGH (ref 70–99)
Glucose-Capillary: 338 mg/dL — ABNORMAL HIGH (ref 70–99)
Glucose-Capillary: 372 mg/dL — ABNORMAL HIGH (ref 70–99)
Glucose-Capillary: 393 mg/dL — ABNORMAL HIGH (ref 70–99)

## 2019-09-04 LAB — CBC
HCT: 45.6 % (ref 39.0–52.0)
Hemoglobin: 15.8 g/dL (ref 13.0–17.0)
MCH: 31.2 pg (ref 26.0–34.0)
MCHC: 34.6 g/dL (ref 30.0–36.0)
MCV: 90.1 fL (ref 80.0–100.0)
Platelets: 174 10*3/uL (ref 150–400)
RBC: 5.06 MIL/uL (ref 4.22–5.81)
RDW: 13.2 % (ref 11.5–15.5)
WBC: 6.4 10*3/uL (ref 4.0–10.5)
nRBC: 0 % (ref 0.0–0.2)

## 2019-09-04 LAB — ECHOCARDIOGRAM COMPLETE
Height: 73 in
Weight: 4843.07 oz

## 2019-09-04 LAB — MAGNESIUM: Magnesium: 2.3 mg/dL (ref 1.7–2.4)

## 2019-09-04 LAB — PHOSPHORUS: Phosphorus: 3.1 mg/dL (ref 2.5–4.6)

## 2019-09-04 MED ORDER — THIAMINE HCL 100 MG/ML IJ SOLN
100.0000 mg | Freq: Every day | INTRAMUSCULAR | Status: DC
  Filled 2019-09-04: qty 2

## 2019-09-04 MED ORDER — THIAMINE HCL 100 MG PO TABS
100.0000 mg | ORAL_TABLET | Freq: Every day | ORAL | Status: DC
  Administered 2019-09-04 – 2019-09-07 (×4): 100 mg via ORAL
  Filled 2019-09-04 (×4): qty 1

## 2019-09-04 MED ORDER — INSULIN ASPART 100 UNIT/ML ~~LOC~~ SOLN
0.0000 [IU] | Freq: Three times a day (TID) | SUBCUTANEOUS | Status: DC
  Administered 2019-09-05: 20 [IU] via SUBCUTANEOUS
  Administered 2019-09-05: 11 [IU] via SUBCUTANEOUS
  Administered 2019-09-05: 20 [IU] via SUBCUTANEOUS
  Administered 2019-09-06 (×2): 11 [IU] via SUBCUTANEOUS
  Administered 2019-09-06: 5 [IU] via SUBCUTANEOUS
  Administered 2019-09-07: 4 [IU] via SUBCUTANEOUS

## 2019-09-04 MED ORDER — ADULT MULTIVITAMIN W/MINERALS CH
1.0000 | ORAL_TABLET | Freq: Every day | ORAL | Status: DC
  Administered 2019-09-04 – 2019-09-07 (×4): 1 via ORAL
  Filled 2019-09-04 (×4): qty 1

## 2019-09-04 MED ORDER — LORAZEPAM 1 MG PO TABS: 1.0000 mg | ORAL_TABLET | ORAL | Status: DC | PRN

## 2019-09-04 MED ORDER — INSULIN ASPART 100 UNIT/ML ~~LOC~~ SOLN
0.0000 [IU] | Freq: Every day | SUBCUTANEOUS | Status: DC
  Administered 2019-09-04: 5 [IU] via SUBCUTANEOUS
  Administered 2019-09-05: 2 [IU] via SUBCUTANEOUS
  Administered 2019-09-06: 3 [IU] via SUBCUTANEOUS

## 2019-09-04 MED ORDER — INSULIN ASPART 100 UNIT/ML ~~LOC~~ SOLN
0.0000 [IU] | SUBCUTANEOUS | Status: DC
  Administered 2019-09-04: 15 [IU] via SUBCUTANEOUS
  Administered 2019-09-04: 11 [IU] via SUBCUTANEOUS

## 2019-09-04 MED ORDER — INSULIN ASPART 100 UNIT/ML ~~LOC~~ SOLN
5.0000 [IU] | Freq: Three times a day (TID) | SUBCUTANEOUS | Status: DC
  Administered 2019-09-05 – 2019-09-07 (×6): 5 [IU] via SUBCUTANEOUS

## 2019-09-04 MED ORDER — POTASSIUM CHLORIDE 10 MEQ/100ML IV SOLN
10.0000 meq | INTRAVENOUS | Status: AC
  Administered 2019-09-04 (×2): 10 meq via INTRAVENOUS
  Filled 2019-09-04 (×2): qty 100

## 2019-09-04 MED ORDER — INSULIN DETEMIR 100 UNIT/ML ~~LOC~~ SOLN
30.0000 [IU] | Freq: Two times a day (BID) | SUBCUTANEOUS | Status: DC
  Administered 2019-09-04 – 2019-09-05 (×2): 30 [IU] via SUBCUTANEOUS
  Filled 2019-09-04 (×3): qty 0.3

## 2019-09-04 MED ORDER — LORAZEPAM 2 MG/ML IJ SOLN: 1.0000 mg | INTRAMUSCULAR | Status: DC | PRN

## 2019-09-04 MED ORDER — INSULIN DETEMIR 100 UNIT/ML ~~LOC~~ SOLN
22.0000 [IU] | Freq: Two times a day (BID) | SUBCUTANEOUS | Status: DC
  Administered 2019-09-04: 22 [IU] via SUBCUTANEOUS
  Filled 2019-09-04 (×2): qty 0.22

## 2019-09-04 MED ORDER — POTASSIUM CHLORIDE CRYS ER 20 MEQ PO TBCR
20.0000 meq | EXTENDED_RELEASE_TABLET | Freq: Once | ORAL | Status: AC
  Administered 2019-09-04: 20 meq via ORAL
  Filled 2019-09-04: qty 1

## 2019-09-04 MED ORDER — FOLIC ACID 1 MG PO TABS
1.0000 mg | ORAL_TABLET | Freq: Every day | ORAL | Status: DC
  Administered 2019-09-04 – 2019-09-07 (×4): 1 mg via ORAL
  Filled 2019-09-04 (×4): qty 1

## 2019-09-04 MED ORDER — PERFLUTREN LIPID MICROSPHERE
1.0000 mL | INTRAVENOUS | Status: AC | PRN
  Administered 2019-09-04: 3 mL via INTRAVENOUS
  Filled 2019-09-04: qty 10

## 2019-09-04 NOTE — Progress Notes (Signed)
NAME:  [name], MRN:  [id number], DOB:  [birth year], LOS: 1 ADMISSION DATE:  09/02/2019, CONSULTATION DATE:  09/04/19 REFERRING MD:  Regenia Skeeter, CHIEF COMPLAINT:  Light-headedness   Brief History   64 year old male who has a history of A. fib/a flutter and type 2 diabetes who presented with lightheadedness and generalized weakness and found to be in significantly hyperglycemic with bradycardia.  PCCM consulted for admission secondary to heart rate in the 30s.  History of present illness   64 year old male with a past medical history significant of atrial fibrillation/flutter status post ablation and multiple cardioversions on Pradaxa, HFrEF EF of 35 to 40%, asthma, type 2 diabetes, hyperlipidemia, hypertension and OSA who woke up today feeling lightheaded and with weakness in both of his legs.  He states he "fell" after his legs gave out and he sat down in a chair, did not hit his head.  He notes some transient left hand tingling, no focal extremity weakness, along with intermittent blurred vision.  He does not usually check his blood sugar.  He is on glipizide at home.  Chest pain, chest pressure, recent fever or illness.  He follows with cardiology and had a nuclear stress test in 03/2019 without clear evidence of blockage.  He has been taking amiodarone, carvedilol, hydralazine, and Imdur as directed and reports no recent increases in dose.  The emergency department, glucose was greater than 1000, CO2 20 gap of 17 and pH 7.2.  He was also bradycardic with heart rate 30s-40s and junctional rhythm without hypotension.  Potassium was 6.1 and he was given 2 g of calcium gluconate and started on an insulin gtt. work-up also significant for an acute on chronic kidney injury with creatinine of 3.2 (baseline 1.4-1.7).  Corrected sodium is 130,  Past Medical History   has a past medical history of Asthma, Atrial fibrillation ([place]), Atrial flutter ([place]), Cataract, Chronic rhinitis, Colon polyp,  Congestive heart failure ([place]), Diverticulosis, Hyperlipidemia, Hypertension, Morbid obesity ([place]), Nonischemic cardiomyopathy ([place]), Seasonal allergies, Sleep apnea, and Type 2 diabetes mellitus ([place]).  Significant Hospital Events   3/20 admit to PCCM  Consults:  Cardiology  Procedures:    Significant Diagnostic Tests:  3/20 CXR>> pending  Micro Data:  3/28 RVP>> negative  Antimicrobials:     Interim history/subjective:  Weaned off insulin gtt this am. Cardiology discontinued dopamine. Patient without complaints of shortness of breath or chest pain  Objective   Blood pressure 112/87, pulse (!) 53, temperature 98 F (36.7 C), resp. rate 17, height 6\' 1"  (1.854 m), weight (!) 137.3 kg, SpO2 96 %.        Intake/Output Summary (Last 24 hours) at 09/04/2019 1105 Last data filed at 09/04/2019 1000 Gross per 24 hour  Intake 2403.88 ml  Output 2500 ml  Net -96.12 ml   Filed Weights   09/02/19 2129 09/03/19 0327 09/04/19 0500  Weight: 129.3 kg (!) 136.1 kg (!) 137.3 kg   Physical Exam: General: Obese, well-appearing, no acute distress HENT: [place], AT, OP clear, MMM Eyes: EOMI, no scleral icterus Respiratory: Clear to auscultation bilaterally.  No crackles, wheezing or rales Cardiovascular: Bradycardic, -M/R/G, no JVD GI: BS+, soft, nontender Extremities:-Edema,-tenderness Neuro: AAO x4, CNII-XII grossly intact Skin: Intact, no rashes or bruising Psych: Normal mood, normal affect  Resolved Hospital Problem list     Assessment & Plan:   Hyperosmolar hyperglycemic state P: -DC insulin gtt -Repeat BMP every 4 hours -Restart diet  Critically ill in the setting of bradycardia in the setting  of persistent atrial fibrillation/a flutter  -Status post ablation many years ago and multiple cardioversions -Anticoagulated on Pradaxa -Recent nuclear stress test without signs of occlusion P: -Consulted Cardiology. DC'd dopamine gtt -Telemetry -F/u echocardiogram -Hold  beta-blocker and amiodarone  HFrEF, HTN -Last echo 03/2019 with EF of 35 to 40% P: -Holding Imdur and Lasix in the setting of bradycardia and volume depletion -F/uechocardiogram  Nonoliguric AKI -improving -creatinine of 3.2 (baseline 1.4-1.7) -Suspect this is likely due to prerenal volume secondary to hyperglycemia P: -Monitor creatinine after IV fluids, holding amiodarone and Lasix -Follow urine output  Hypothyroidism Increased TSH and low T4 P: -Continue Synthroid   OSA -pt uses CPAP at night, will continue  Asthma -Continue home [place]  Best practice:  Diet: Heart healthy low carb Pain/Anxiety/Delirium protocol (if indicated): n/a VAP protocol (if indicated): n/a DVT prophylaxis: lovenox GI prophylaxis: n/a Glucose control: DC insulin gtt Mobility: With assist Code Status: Full code Family Communication: Updated patient at bedside Disposition: ICU  Labs   CBC: Recent Labs  Lab 09/02/19 2142 09/02/19 2326 09/02/19 2332 09/03/19 0329 09/04/19 0755  WBC 7.0  --   --  8.0 6.4  HGB 15.1 16.3 14.3 14.9 15.8  HCT 44.2 48.0 42.0 41.3 45.6  MCV 91.7  --   --  85.9 90.1  PLT 196  --   --  182 [id number]    Basic Metabolic Panel: Recent Labs  Lab 09/03/19 0329 09/03/19 0807 09/03/19 1539 09/03/19 1950 09/04/19 0020 09/04/19 0350 09/04/19 0755  NA 124*   < > 132* 131* 134* 136 135  K 3.9   < > 3.8 3.8 3.3* 3.2* 3.3*  CL 88*   < > 96* 99 100 101 101  CO2 23   < > 26 26 25 27 28   GLUCOSE 551*   < > 197* 197* 193* 126* 155*  BUN 54*   < > 40* 32* 33* 29* 28*  CREATININE 2.71*   < > 1.95* 1.88* 1.87* 1.79* 1.69*  CALCIUM 9.4   < > 9.2 9.0 8.9 9.3 8.9  MG 2.4  --   --   --   --   --  2.3  PHOS 4.1  --   --   --   --   --  3.1   < > = values in this interval not displayed.   GFR: Estimated Creatinine Clearance: 64.3 mL/min (A) (by C-G formula based on SCr of 1.69 mg/dL (H)). Recent Labs  Lab 09/02/19 2142 09/03/19 0329 09/04/19 0755  WBC 7.0 8.0 6.4     Liver Function Tests: Recent Labs  Lab 09/04/19 0755  AST 28  ALT 22  ALKPHOS 66  BILITOT 1.0  PROT 6.5  ALBUMIN 3.2*   No results for input(s): LIPASE, AMYLASE in the last 168 hours. No results for input(s): AMMONIA in the last 168 hours.  ABG    Component Value Date/Time   PHART 7.435 03/14/2009 1020   PCO2ART 36.1 03/14/2009 1020   PO2ART 52.0 (L) 03/14/2009 1020   HCO3 18.3 (L) 09/02/2019 2332   TCO2 19 (L) 09/02/2019 2332   ACIDBASEDEF 7.0 (H) 09/02/2019 2332   O2SAT 84.0 09/02/2019 2332     Coagulation Profile: No results for input(s): INR, PROTIME in the last 168 hours.  Cardiac Enzymes: No results for input(s): CKTOTAL, CKMB, CKMBINDEX, TROPONINI in the last 168 hours.  HbA1C: Hgb A1c MFr Bld  Date/Time Value Ref Range Status  09/04/2019 07:55 AM 14.2 (H) 4.8 - 5.6 %  Final    Comment:    (NOTE) Pre diabetes:          5.7%-6.4% Diabetes:              >6.4% Glycemic control for   <7.0% adults with diabetes   03/08/2019 09:46 AM 6.9 (H) 4.6 - 6.5 % Final    Comment:    Glycemic Control Guidelines for People with Diabetes:Non Diabetic:  <6%Goal of Therapy: <7%Additional Action Suggested:  >8%     CBG: Recent Labs  Lab 09/04/19 0602 09/04/19 0657 09/04/19 0733 09/04/19 0847 09/04/19 0947  GLUCAP 142* 147* 171* 148* 171*    Critical care time: 33 minutes    The patient is critically ill with multiple organ systems failure and requires high complexity decision making for assessment and support, frequent evaluation and titration of therapies, application of advanced monitoring technologies and extensive interpretation of multiple databases.   Critical Care Time devoted to patient care services described in this note is 33  Minutes.  Rodman Pickle, M.D. [facility] Pulmonary/Critical Care Medicine 09/04/2019 11:05 AM   Please see Amion for pager number to reach on-call Pulmonary and Critical Care Team.

## 2019-09-04 NOTE — Progress Notes (Signed)
Echocardiogram 2D Echocardiogram has been performed.  Oneal Deputy Keerthi Hazell 09/04/2019, 9:15 AM

## 2019-09-04 NOTE — Progress Notes (Signed)
eLink Physician-Brief Progress Note Patient Name: [name] DOB: Aug 20, 1955 MRN: [id number]   Date of Service  09/04/2019  HPI/Events of Note  K+ 3.3  eICU Interventions  KCL 10 meq iv Q 1 hour x 2        [name] 09/04/2019, 2:36 AM

## 2019-09-04 NOTE — Progress Notes (Signed)
Progress Note  Patient Name: [name] Date of Encounter: 09/04/2019  Primary Cardiologist: Buford Dresser, MD  Subjective   Feels better, eating a sandwich this morning.  No lightheadedness or chest pain.  Inpatient Medications    Scheduled Meds: . Chlorhexidine Gluconate Cloth  6 each Topical Q0600  . dabigatran  150 mg Oral BID  . folic acid  1 mg Oral Daily  . gabapentin  600 mg Oral BID  . insulin aspart  0-15 Units Subcutaneous Q4H  . insulin detemir  22 Units Subcutaneous BID  . levothyroxine  50 mcg Oral QAC breakfast  . mouth rinse  15 mL Mouth Rinse BID  . mometasone-formoterol  2 puff Inhalation BID  . multivitamin with minerals  1 tablet Oral Daily  . potassium chloride  20 mEq Oral Once  . rosuvastatin  10 mg Oral q1800  . sodium chloride flush  3 mL Intravenous Once  . thiamine  100 mg Oral Daily   Or  . thiamine  100 mg Intravenous Daily    PRN Meds: albuterol, dextrose, doxylamine (Sleep), LORazepam **OR** LORazepam, perflutren lipid microspheres (DEFINITY) IV suspension, traMADol   Vital Signs    Vitals:   09/04/19 0600 09/04/19 0700 09/04/19 0730 09/04/19 0900  BP: 117/70 118/78 112/89 121/89  Pulse: (!) 51 (!) 53    Resp:  17    Temp:  (!) 97.5 F (36.4 C) 98 F (36.7 C)   TempSrc:  Oral    SpO2: 96% 96%    Weight:      Height:        Intake/Output Summary (Last 24 hours) at 09/04/2019 1015 Last data filed at 09/04/2019 1000 Gross per 24 hour  Intake 2159.02 ml  Output 2300 ml  Net -140.98 ml   Filed Weights   09/02/19 2129 09/03/19 0327 09/04/19 0500  Weight: 129.3 kg (!) 136.1 kg (!) 137.3 kg    Telemetry    Sinus rhythm and sinus bradycardia, heart rates high 50s to low 60s at present.  Personally reviewed.  ECG    An ECG dated 09/04/2019 was personally reviewed today and demonstrated:  Sinus rhythm with prolonged PR interval and IVCD.  Physical Exam   GEN:  Morbidly obese, no acute distress.   Neck: No  JVD. Cardiac: RRR, no murmur, rub, or gallop.  Respiratory: Nonlabored. Clear to auscultation bilaterally. GI: Soft, nontender, bowel sounds present. MS: No edema; No deformity. Neuro:  Nonfocal. Psych: Alert and oriented x 3. Normal affect.  Labs    Chemistry Recent Labs  Lab 09/04/19 0020 09/04/19 0350 09/04/19 0755  NA 134* 136 135  K 3.3* 3.2* 3.3*  CL 100 101 101  CO2 25 27 28   GLUCOSE 193* 126* 155*  BUN 33* 29* 28*  CREATININE 1.87* 1.79* 1.69*  CALCIUM 8.9 9.3 8.9  PROT  --   --  6.5  ALBUMIN  --   --  3.2*  AST  --   --  28  ALT  --   --  22  ALKPHOS  --   --  66  BILITOT  --   --  1.0  GFRNONAA 37* 39* 42*  GFRAA 43* 45* 49*  ANIONGAP 9 8 6      Hematology Recent Labs  Lab 09/02/19 2142 09/02/19 2326 09/02/19 2332 09/03/19 0329 09/04/19 0755  WBC 7.0  --   --  8.0 6.4  RBC 4.82  --   --  4.81 5.06  HGB 15.1   < >  14.3 14.9 15.8  HCT 44.2   < > 42.0 41.3 45.6  MCV 91.7  --   --  85.9 90.1  MCH 31.3  --   --  31.0 31.2  MCHC 34.2  --   --  36.1* 34.6  RDW 13.8  --   --  12.7 13.2  PLT 196  --   --  182 174   < > = values in this interval not displayed.    Cardiac Enzymes Recent Labs  Lab 09/03/19 0006 09/03/19 0329 09/03/19 1206  TROPONINIHS 61* 84* 82*    Radiology    DG CHEST PORT 1 VIEW  Result Date: 09/03/2019 CLINICAL DATA:  Diabetic ketoacidosis EXAM: PORTABLE CHEST 1 VIEW COMPARISON:  February 22, 2019 FINDINGS: The heart size and mediastinal contours are within normal limits. Both lungs are clear. The visualized skeletal structures are unremarkable. IMPRESSION: No active disease. Electronically Signed   By: Dorise Bullion III M.D   On: 09/03/2019 08:32    Cardiac Studies   Lexiscan Myoview 04/08/2019:  Nuclear stress EF: 49%.  The left ventricular ejection fraction is mildly decreased (45-54%).  No T wave inversion was noted during stress.  There was no ST segment deviation noted during stress.  Defect 1: There is a  small defect of mild severity.  This is an intermediate risk study.  Small size, mild intensity fixed apical perfusion defect, likely thinning. No significant reversible ischemia. Dilated LV with mild hypokinesis - LVEF 49%. This is an intermediate risk study due to decreased LV function.  Echocardiogram 03/29/2019: 1. Left ventricular ejection fraction, by visual estimation, is 35 to  40%. The left ventricle has moderate to severely decreased function.  Normal left ventricular size. There is mildly increased left ventricular  hypertrophy.  2. Left ventricular diastolic Doppler parameters are consistent with  impaired relaxation pattern of LV diastolic filling.  3. Global right ventricle has normal systolic function.The right  ventricular size is mildly enlarged. No increase in right ventricular wall  thickness.  4. Left atrial size was normal.  5. Right atrial size was normal.  6. The mitral valve is normal in structure. Mild mitral valve  regurgitation. No evidence of mitral stenosis.  7. The tricuspid valve is normal in structure. Tricuspid valve  regurgitation was not visualized by color flow Doppler.  8. The aortic valve is normal in structure. Aortic valve regurgitation  was not visualized by color flow Doppler. Structurally normal aortic  valve, with no evidence of sclerosis or stenosis.  9. The pulmonic valve was normal in structure. Pulmonic valve  regurgitation is not visualized by color flow Doppler.  10. The inferior vena cava is normal in size with greater than 50%  respiratory variability, suggesting right atrial pressure of 3 mmHg.   Patient Profile     64 y.o. male with a history of persistent atrial fibrillation, atrial flutter status post ablation in 2012, hypertension, tachycardia mediated cardiomyopathy, type 2 diabetes mellitus, and OSA on CPAP who is being seen for the evaluation of bradycardia.  Assessment & Plan    1.  Symptomatic bradycardia,  presently resolved.  Patient had possible junctional rhythm rule out transient higher degree heart block at presentation.  Low-dose dopamine discontinued this morning, heart rate in the high 50s to low 60s.  Both amiodarone and Coreg have been held.  Follow-up ECG shows sinus rhythm with prolonged PR interval and IVCD suggestive of underlying conduction system disease.  2.  Persistent atrial fibrillation by history  and also status post atrial flutter ablation in 2012.  Outpatient regimen included Pradaxa, amiodarone, and Coreg which patient confirms was at 6.25 mg twice daily.  3.  History of nonischemic cardiomyopathy, LVEF approximately 40%, follow-up echocardiogram pending.  4.  Hypothyroidism, TSH 33, question also contributing to bradycardia.  He has been on Synthroid as an outpatient.  5.  OSA on CPAP.  6.  Hyperosmolar hyperglycemic state, improving.  Follow-up echocardiogram.  Agree with discontinuation of dopamine which was done this morning.  Follow heart rate on telemetry.  May eventually be able to resume amiodarone for suppression of atrial fibrillation longer-term, but would hold off on Coreg for the time being.  Continue Pradaxa for stroke prophylaxis.  Signed, Rozann Lesches, MD  09/04/2019, 10:15 AM

## 2019-09-05 ENCOUNTER — Ambulatory Visit: Payer: BC Managed Care – PPO | Admitting: Family

## 2019-09-05 DIAGNOSIS — E1100 Type 2 diabetes mellitus with hyperosmolarity without nonketotic hyperglycemic-hyperosmolar coma (NKHHC): Secondary | ICD-10-CM | POA: Diagnosis not present

## 2019-09-05 DIAGNOSIS — I428 Other cardiomyopathies: Secondary | ICD-10-CM

## 2019-09-05 DIAGNOSIS — I5042 Chronic combined systolic (congestive) and diastolic (congestive) heart failure: Secondary | ICD-10-CM | POA: Diagnosis not present

## 2019-09-05 DIAGNOSIS — E1165 Type 2 diabetes mellitus with hyperglycemia: Secondary | ICD-10-CM

## 2019-09-05 DIAGNOSIS — E1169 Type 2 diabetes mellitus with other specified complication: Secondary | ICD-10-CM | POA: Diagnosis not present

## 2019-09-05 DIAGNOSIS — E785 Hyperlipidemia, unspecified: Secondary | ICD-10-CM | POA: Diagnosis not present

## 2019-09-05 DIAGNOSIS — R739 Hyperglycemia, unspecified: Secondary | ICD-10-CM

## 2019-09-05 DIAGNOSIS — R001 Bradycardia, unspecified: Secondary | ICD-10-CM | POA: Diagnosis not present

## 2019-09-05 LAB — BASIC METABOLIC PANEL
Anion gap: 8 (ref 5–15)
Anion gap: 8 (ref 5–15)
BUN: 29 mg/dL — ABNORMAL HIGH (ref 8–23)
BUN: 29 mg/dL — ABNORMAL HIGH (ref 8–23)
CO2: 23 mmol/L (ref 22–32)
CO2: 24 mmol/L (ref 22–32)
Calcium: 8.8 mg/dL — ABNORMAL LOW (ref 8.9–10.3)
Calcium: 8.7 mg/dL — ABNORMAL LOW (ref 8.9–10.3)
Chloride: 99 mmol/L (ref 98–111)
Chloride: 100 mmol/L (ref 98–111)
Creatinine, Ser: 2.01 mg/dL — ABNORMAL HIGH (ref 0.61–1.24)
Creatinine, Ser: 1.97 mg/dL — ABNORMAL HIGH (ref 0.61–1.24)
GFR calc Af Amer: 39 mL/min — ABNORMAL LOW (ref >60)
GFR calc Af Amer: 40 mL/min — ABNORMAL LOW (ref >60)
GFR calc non Af Amer: 34 mL/min — ABNORMAL LOW (ref >60)
GFR calc non Af Amer: 35 mL/min — ABNORMAL LOW (ref >60)
Glucose, Bld: 452 mg/dL — ABNORMAL HIGH (ref 70–99)
Glucose, Bld: 390 mg/dL — ABNORMAL HIGH (ref 70–99)
Potassium: 4.3 mmol/L (ref 3.5–5.1)
Potassium: 4.2 mmol/L (ref 3.5–5.1)
Sodium: 130 mmol/L — ABNORMAL LOW (ref 135–145)
Sodium: 132 mmol/L — ABNORMAL LOW (ref 135–145)

## 2019-09-05 LAB — GLUCOSE, CAPILLARY
Glucose-Capillary: 433 mg/dL — ABNORMAL HIGH (ref 70–99)
Glucose-Capillary: 471 mg/dL — ABNORMAL HIGH (ref 70–99)
Glucose-Capillary: 297 mg/dL — ABNORMAL HIGH (ref 70–99)
Glucose-Capillary: 239 mg/dL — ABNORMAL HIGH (ref 70–99)

## 2019-09-05 LAB — HEMOGLOBIN A1C
Hgb A1c MFr Bld: 15 % — ABNORMAL HIGH (ref 4.8–5.6)
Mean Plasma Glucose: 384 mg/dL

## 2019-09-05 MED ORDER — LIVING WELL WITH DIABETES BOOK
Freq: Once | Status: AC
  Filled 2019-09-05: qty 1

## 2019-09-05 MED ORDER — INSULIN DETEMIR 100 UNIT/ML ~~LOC~~ SOLN
35.0000 [IU] | Freq: Two times a day (BID) | SUBCUTANEOUS | Status: DC
  Administered 2019-09-05 – 2019-09-06 (×2): 35 [IU] via SUBCUTANEOUS
  Filled 2019-09-05 (×3): qty 0.35

## 2019-09-05 MED ORDER — INSULIN STARTER KIT- PEN NEEDLES (ENGLISH)
1.0000 | Freq: Once | Status: AC
  Administered 2019-09-05: 1
  Filled 2019-09-05: qty 1

## 2019-09-05 NOTE — Discharge Instructions (Addendum)
Your A1c 14.2% this admission. Goal is to be at 7% or less (average of 150 glucose when you check your blood sugar)  Hypoglycemia/or low blood sugar is below 70. See directions in the paperwork below on what to do (15 grams of carbs wait 15 minutes)  Call your doctor for glucose >180 most of the time over the course of 3-4 days for adjustments. Call your doctor for low blood sugars as well.   Eat between 45-60 grams of carbs per meal.  Insulin pen add a new needle every time. "get the needle ready" by dialing up the end to 1-2 units and push down until the numbers go to zero. Now the pen is ready for you to dial up your dose listed in your discharge paperwork. Make sure you keep your 2 insulins separate. Keep in the refrigerator until you start to use the pen and break the seal. Then you can leave it at room temperature.  If you are not prescribed the free style libre 14 day sensor, ask your doctor.       Information on my medicine - Pradaxa (dabigatran)  This medication education was reviewed with me or my healthcare representative as part of my discharge preparation.    Why was Pradaxa prescribed for you? Pradaxa was prescribed for you to reduce the risk of forming blood clots that cause a stroke if you have a medical condition called atrial fibrillation (a type of irregular heartbeat).    What do you Need to know about PradAXa? Take your Pradaxa TWICE DAILY - one capsule in the morning and one tablet in the evening with or without food.  It would be best to take the doses about the same time each day.  The capsules should not be broken, chewed or opened - they must be swallowed whole.  Do not store Pradaxa in other medication containers - once the bottle is opened the Pradaxa should be used within FOUR months; throw away any capsules that haven't been by that time.  Take Pradaxa exactly as prescribed by your doctor.  DO NOT stop taking Pradaxa without talking to the doctor  who prescribed the medication.  Stopping without other stroke prevention medication to take the place of Pradaxa may increase your risk of developing a clot that causes a stroke.  Refill your prescription before you run out.  After discharge, you should have regular check-up appointments with your healthcare provider that is prescribing your Pradaxa.  In the future your dose may need to be changed if your kidney function or weight changes by a significant amount.  What do you do if you miss a dose? If you miss a dose, take it as soon as you remember on the same day.  If your next dose is less than 6 hours away, skip the missed dose.  Do not take two doses of PRADAXA at the same time.  Important Safety Information A possible side effect of Pradaxa is bleeding. You should call your healthcare provider right away if you experience any of the following: ? Bleeding from an injury or your nose that does not stop. ? Unusual colored urine (red or dark brown) or unusual colored stools (red or black). ? Unusual bruising for unknown reasons. ? A serious fall or if you hit your head (even if there is no bleeding).  Some medicines may interact with Pradaxa and might increase your risk of bleeding or clotting while on Pradaxa. To help avoid this, consult your healthcare  provider or pharmacist prior to using any new prescription or non-prescription medications, including herbals, vitamins, non-steroidal anti-inflammatory drugs (NSAIDs) and supplements.  This website has more information on Pradaxa (dabigatran): [email or web address]   Local Endocrinologists [facility] [phone]) 1. Dr. Philemon Kingdom 2. Dr. Janie [facility] [phone]) 1. Dr. Delrae Rend [facility] [phone]) 1. Dr. Jacelyn Pi 2. Dr. Anda Kraft [facility] [phone]) 1. Dr. Daneil Dolin [facility] [phone]) [[place]  office]  [phone]) [[place] office] 1. Dr. Lenna Sciara [place] 2. Dr. Mee Hives [facility]) [phone]) 1. Autumn Hudnall Ronnald Ramp), [place] 2. Dr. Amalia Greenhouse 3. Dr. Marsh Dolly. [facility] [phone]) 1. Dr. Glade Lloyd  Dr. Carolynn Serve. [facility] in [place] [phone]) Hypoglycemia Hypoglycemia occurs when the level of sugar (glucose) in the blood is too low. Hypoglycemia can happen in people who do or do not have diabetes. It can develop quickly, and it can be a medical emergency. For most people with diabetes, a blood glucose level below 70 mg/dL (3.9 mmol/L) is considered hypoglycemia. Glucose is a type of sugar that provides the body's main source of energy. Certain hormones (insulin and glucagon) control the level of glucose in the blood. Insulin lowers blood glucose, and glucagon raises blood glucose. Hypoglycemia can result from having too much insulin in the bloodstream, or from not eating enough food that contains glucose. You may also have reactive hypoglycemia, which happens within 4 hours after eating a meal. What are the causes? Hypoglycemia occurs most often in people who have diabetes and may be caused by:  Diabetes medicine.  Not eating enough, or not eating often enough.  Increased physical activity.  Drinking alcohol on an empty stomach. If you do not have diabetes, hypoglycemia may be caused by:  A tumor in the pancreas.  Not eating enough, or not eating for long periods at a time (fasting).  A severe infection or illness.  Certain medicines. What increases the risk? Hypoglycemia is more likely to develop in:  People who have diabetes and take medicines to lower blood glucose.  People who abuse alcohol.  People who have a severe illness. What are the signs or symptoms? Mild symptoms Mild hypoglycemia may not cause any symptoms. If you do have symptoms, they may  include:  Hunger.  Anxiety.  Sweating and feeling clammy.  Dizziness or feeling light-headed.  Sleepiness.  Nausea.  Increased heart rate.  Headache.  Blurry vision.  Irritability.  Tingling or numbness around the mouth, lips, or tongue.  A change in coordination.  Restless sleep. Moderate symptoms Moderate hypoglycemia can cause:  Mental confusion and poor judgment.  Behavior changes.  Weakness.  Irregular heartbeat. Severe symptoms Severe hypoglycemia is a medical emergency. It can cause:  Fainting.  Seizures.  Loss of consciousness (coma).  Death. How is this diagnosed? Hypoglycemia is diagnosed with a blood test to measure your blood glucose level. This blood test is done while you are having symptoms. Your health care provider may also do a physical exam and review your medical history. How is this treated? This condition can often be treated by immediately eating or drinking something that contains sugar, such as:  Fruit juice, 4-6 oz (120-150 mL).  Regular soda (not diet soda), 4-6 oz (120-150 mL).  Low-fat milk, 4 oz (120 mL).  Several pieces of hard candy.  Sugar or honey, 1 Tbsp (15 mL). Treating hypoglycemia if you have diabetes If you are alert and able to  swallow safely, follow the 15:15 rule:  Take 15 grams of a rapid-acting carbohydrate. Talk with your health care provider about how much you should take.  Rapid-acting options include: ? Glucose pills (take 15 grams). ? 6-8 pieces of hard candy. ? 4-6 oz (120-150 mL) of fruit juice. ? 4-6 oz (120-150 mL) of regular (not diet) soda. ? 1 Tbsp (15 mL) honey or sugar.  Check your blood glucose 15 minutes after you take the carbohydrate.  If the repeat blood glucose level is still at or below 70 mg/dL (3.9 mmol/L), take 15 grams of a carbohydrate again.  If your blood glucose level does not increase above 70 mg/dL (3.9 mmol/L) after 3 tries, seek emergency medical care.  After  your blood glucose level returns to normal, eat a meal or a snack within 1 hour.  Treating severe hypoglycemia Severe hypoglycemia is when your blood glucose level is at or below 54 mg/dL (3 mmol/L). Severe hypoglycemia is a medical emergency. Get medical help right away. If you have severe hypoglycemia and you cannot eat or drink, you may need an injection of glucagon. A family member or close friend should learn how to check your blood glucose and how to give you a glucagon injection. Ask your health care provider if you need to have an emergency glucagon injection kit available. Severe hypoglycemia may need to be treated in a hospital. The treatment may include getting glucose through an IV. You may also need treatment for the cause of your hypoglycemia. Follow these instructions at home:  General instructions  Take over-the-counter and prescription medicines only as told by your health care provider.  Monitor your blood glucose as told by your health care provider.  Limit alcohol intake to no more than 1 drink a day for nonpregnant women and 2 drinks a day for men. One drink equals 12 oz of beer (355 mL), 5 oz of wine (148 mL), or 1 oz of hard liquor (44 mL).  Keep all follow-up visits as told by your health care provider. This is important. If you have diabetes:  Always have a rapid-acting carbohydrate snack with you to treat low blood glucose.  Follow your diabetes management plan as directed. Make sure you: ? Know the symptoms of hypoglycemia. It is important to treat it right away to prevent it from becoming severe. ? Take your medicines as directed. ? Follow your exercise plan. ? Follow your meal plan. Eat on time, and do not skip meals. ? Check your blood glucose as often as directed. Always check before and after exercise. ? Follow your sick day plan whenever you cannot eat or drink normally. Make this plan in advance with your health care provider.  Share your diabetes  management plan with people in your workplace, school, and household.  Check your urine for ketones when you are ill and as told by your health care provider.  Carry a medical alert card or wear medical alert jewelry. Contact a health care provider if:  You have problems keeping your blood glucose in your target range.  You have frequent episodes of hypoglycemia. Get help right away if:  You continue to have hypoglycemia symptoms after eating or drinking something containing glucose.  Your blood glucose is at or below 54 mg/dL (3 mmol/L).  You have a seizure.  You faint. These symptoms may represent a serious problem that is an emergency. Do not wait to see if the symptoms will go away. Get medical help right away.  Call your local emergency services (911 in the [place] Summary  Hypoglycemia occurs when the level of sugar (glucose) in the blood is too low.  Hypoglycemia can happen in people who do or do not have diabetes. It can develop quickly, and it can be a medical emergency.  Make sure you know the symptoms of hypoglycemia and how to treat it.  Always have a rapid-acting carbohydrate snack with you to treat low blood sugar. This information is not intended to replace advice given to you by your health care provider. Make sure you discuss any questions you have with your health care provider. Document Revised: 11/23/2017 Document Reviewed: 07/06/2015 Elsevier Patient Education  [place].  Hyperglycemia Hyperglycemia occurs when the level of sugar (glucose) in the blood is too high. Glucose is a type of sugar that provides the body's main source of energy. Certain hormones (insulin and glucagon) control the level of glucose in the blood. Insulin lowers blood glucose, and glucagon increases blood glucose. Hyperglycemia can result from having too little insulin in the bloodstream, or from the body not responding normally to insulin. Hyperglycemia occurs most often in  people who have diabetes (diabetes mellitus), but it can happen in people who do not have diabetes. It can develop quickly, and it can be life-threatening if it causes you to become severely dehydrated (diabetic ketoacidosis or hyperglycemic hyperosmolar state). Severe hyperglycemia is a medical emergency. What are the causes? If you have diabetes, hyperglycemia may be caused by:  Diabetes medicine.  Medicines that increase blood glucose or affect your diabetes control.  Not eating enough, or not eating often enough.  Changes in physical activity level.  Being sick or having an infection. If you have prediabetes or undiagnosed diabetes:  Hyperglycemia may be caused by those conditions. If you do not have diabetes, hyperglycemia may be caused by:  Certain medicines, including steroid medicines, beta-blockers, epinephrine, and thiazide diuretics.  Stress.  Serious illness.  Surgery.  Diseases of the pancreas.  Infection. What increases the risk? Hyperglycemia is more likely to develop in people who have risk factors for diabetes, such as:  Having a family member with diabetes.  Having a gene for type 1 diabetes that is passed from parent to child (inherited).  Living in an area with cold weather conditions.  Exposure to certain viruses.  Certain conditions in which the body's disease-fighting (immune) system attacks itself (autoimmune disorders).  Being overweight or obese.  Having an inactive (sedentary) lifestyle.  Having been diagnosed with insulin resistance.  Having a history of prediabetes, gestational diabetes, or polycystic ovarian syndrome (PCOS).  Being of American-Indian, African-American, Hispanic/Latino, or Asian/Pacific Islander descent. What are the signs or symptoms? Hyperglycemia may not cause any symptoms. If you do have symptoms, they may include early warning signs, such as:  Increased thirst.  Hunger.  Feeling very tired.  Needing to  urinate more often than usual.  Blurry vision. Other symptoms may develop if hyperglycemia gets worse, such as:  Dry mouth.  Loss of appetite.  Fruity-smelling breath.  Weakness.  Unexpected or rapid weight gain or weight loss.  Tingling or numbness in the hands or feet.  Headache.  Skin that does not quickly return to normal after being lightly pinched and released (poor skin turgor).  Abdominal pain.  Cuts or bruises that are slow to heal. How is this diagnosed? Hyperglycemia is diagnosed with a blood test to measure your blood glucose level. This blood test is usually done while you are having  symptoms. Your health care provider may also do a physical exam and review your medical history. You may have more tests to determine the cause of your hyperglycemia, such as:  A fasting blood glucose (FBG) test. You will not be allowed to eat (you will fast) for at least 8 hours before a blood sample is taken.  An A1c (hemoglobin A1c) blood test. This provides information about blood glucose control over the previous 2-3 months.  An oral glucose tolerance test (OGTT). This measures your blood glucose at two times: ? After fasting. This is your baseline blood glucose level. ? Two hours after drinking a beverage that contains glucose. How is this treated? Treatment depends on the cause of your hyperglycemia. Treatment may include:  Taking medicine to regulate your blood glucose levels. If you take insulin or other diabetes medicines, your medicine or dosage may be adjusted.  Lifestyle changes, such as exercising more, eating healthier foods, or losing weight.  Treating an illness or infection, if this caused your hyperglycemia.  Checking your blood glucose more often.  Stopping or reducing steroid medicines, if these caused your hyperglycemia. If your hyperglycemia becomes severe and it results in hyperglycemic hyperosmolar state, you must be hospitalized and given IV  fluids. Follow these instructions at home:  General instructions  Take over-the-counter and prescription medicines only as told by your health care provider.  Do not use any products that contain nicotine or tobacco, such as cigarettes and e-cigarettes. If you need help quitting, ask your health care provider.  Limit alcohol intake to no more than 1 drink per day for nonpregnant women and 2 drinks per day for men. One drink equals 12 oz of beer, 5 oz of wine, or 1 oz of hard liquor.  Learn to manage stress. If you need help with this, ask your health care provider.  Keep all follow-up visits as told by your health care provider. This is important. Eating and drinking   Maintain a healthy weight.  Exercise regularly, as directed by your health care provider.  Stay hydrated, especially when you exercise, get sick, or spend time in hot temperatures.  Eat healthy foods, such as: ? Lean proteins. ? Complex carbohydrates. ? Fresh fruits and vegetables. ? Low-fat dairy products. ? Healthy fats.  Drink enough fluid to keep your urine clear or pale yellow. If you have diabetes:  Make sure you know the symptoms of hyperglycemia.  Follow your diabetes management plan, as told by your health care provider. Make sure you: ? Take your insulin and medicines as directed. ? Follow your exercise plan. ? Follow your meal plan. Eat on time, and do not skip meals. ? Check your blood glucose as often as directed. Make sure to check your blood glucose before and after exercise. If you exercise longer or in a different way than usual, check your blood glucose more often. ? Follow your sick day plan whenever you cannot eat or drink normally. Make this plan in advance with your health care provider.  Share your diabetes management plan with people in your workplace, school, and household.  Check your urine for ketones when you are ill and as told by your health care provider.  Carry a medical  alert card or wear medical alert jewelry. Contact a health care provider if:  Your blood glucose is at or above 240 mg/dL (13.3 mmol/L) for 2 days in a row.  You have problems keeping your blood glucose in your target range.  You have  frequent episodes of hyperglycemia. Get help right away if:  You have difficulty breathing.  You have a change in how you think, feel, or act (mental status).  You have nausea or vomiting that does not go away. These symptoms may represent a serious problem that is an emergency. Do not wait to see if the symptoms will go away. Get medical help right away. Call your local emergency services (911 in the [place] Do not drive yourself to the hospital. Summary  Hyperglycemia occurs when the level of sugar (glucose) in the blood is too high.  Hyperglycemia is diagnosed with a blood test to measure your blood glucose level. This blood test is usually done while you are having symptoms. Your health care provider may also do a physical exam and review your medical history.  If you have diabetes, follow your diabetes management plan as told by your health care provider.  Contact your health care provider if you have problems keeping your blood glucose in your target range. This information is not intended to replace advice given to you by your health care provider. Make sure you discuss any questions you have with your health care provider. Document Revised: 02/18/2016 Document Reviewed: 02/18/2016 Elsevier Patient Education  [place].  Hemoglobin A1c Test Why am I having this test? You may have the hemoglobin A1c test (HbA1c test) done to:  Evaluate your risk for developing diabetes (diabetes mellitus).  Diagnose diabetes.  Monitor long-term control of blood sugar (glucose) in people who have diabetes and help make treatment decisions. This test may be done with other blood glucose tests, such as fasting blood glucose and oral glucose tolerance  tests. What is being tested? Hemoglobin is a type of protein in the blood that carries oxygen. Glucose attaches to hemoglobin to form glycated hemoglobin. This test checks the amount of glycated hemoglobin in your blood, which is a good indicator of the average amount of glucose in your blood during the past 2-3 months. What kind of sample is taken?  A blood sample is required for this test. It is usually collected by inserting a needle into a blood vessel. Tell a health care provider about:  All medicines you are taking, including vitamins, herbs, eye drops, creams, and over-the-counter medicines.  Any blood disorders you have.  Any surgeries you have had.  Any medical conditions you have.  Whether you are pregnant or may be pregnant. How are the results reported? Your results will be reported as a percentage that indicates how much of your hemoglobin has glucose attached to it (is glycated). Your health care provider will compare your results to normal ranges that were established after testing a large group of people (reference ranges). Reference ranges may vary among labs and hospitals. For this test, common reference ranges are:  Adult or child without diabetes: 4-5.6%.  Adult or child with diabetes and good blood glucose control: less than 7%. What do the results mean? If you have diabetes:  A result of less than 7% is considered normal, meaning that your blood glucose is well controlled.  A result higher than 7% means that your blood glucose is not well controlled, and your treatment plan may need to be adjusted. If you do not have diabetes:  A result within the reference range is considered normal, meaning that you are not at high risk for diabetes.  A result of 5.7-6.4% means that you have a high risk of developing diabetes, and you may have prediabetes. Prediabetes  is the condition of having a blood glucose level that is higher than it should be, but not high enough for you  to be diagnosed with diabetes. Having prediabetes puts you at risk for developing type 2 diabetes (type 2 diabetes mellitus). You may have more tests, including a repeat HbA1c test.  Results of 6.5% or higher on two separate HbA1c tests mean that you have diabetes. You may have more tests to confirm the diagnosis. Abnormally low HbA1c values may be caused by:  Pregnancy.  Severe blood loss.  Receiving donated blood (transfusions).  Low red blood cell count (anemia).  Long-term kidney failure.  Some unusual forms (variants) of hemoglobin. Talk with your health care provider about what your results mean. Questions to ask your health care provider Ask your health care provider, or the department that is doing the test:  When will my results be ready?  How will I get my results?  What are my treatment options?  What other tests do I need?  What are my next steps? Summary  The hemoglobin A1c test (HbA1c test) may be done to evaluate your risk for developing diabetes, to diagnose diabetes, and to monitor long-term control of blood sugar (glucose) in people who have diabetes and help make treatment decisions.  Hemoglobin is a type of protein in the blood that carries oxygen. Glucose attaches to hemoglobin to form glycated hemoglobin. This test checks the amount of glycated hemoglobin in your blood, which is a good indicator of the average amount of glucose in your blood during the past 2-3 months.  Talk with your health care provider about what your results mean. This information is not intended to replace advice given to you by your health care provider. Make sure you discuss any questions you have with your health care provider. Document Revised: 05/15/2017 Document Reviewed: 01/13/2017 Elsevier Patient Education  [place].

## 2019-09-05 NOTE — Progress Notes (Signed)
NAME:  [name], MRN:  [id number], DOB:  12/26/1955, LOS: 2 ADMISSION DATE:  09/02/2019, CONSULTATION DATE:  09/05/19 REFERRING MD:  Regenia Skeeter, CHIEF COMPLAINT:  Light-headedness   Brief History   64 year old male who has a history of A. fib/a flutter and type 2 diabetes who presented with lightheadedness and generalized weakness and found to be in significantly hyperglycemic with bradycardia.  PCCM consulted for admission secondary to heart rate in the 30s.  History of present illness   64 year old male with a past medical history significant of atrial fibrillation/flutter status post ablation and multiple cardioversions on Pradaxa, HFrEF EF of 35 to 40%, asthma, type 2 diabetes, hyperlipidemia, hypertension and OSA who woke up today feeling lightheaded and with weakness in both of his legs.  He states he "fell" after his legs gave out and he sat down in a chair, did not hit his head.  He notes some transient left hand tingling, no focal extremity weakness, along with intermittent blurred vision.  He does not usually check his blood sugar.  He is on glipizide at home.  Chest pain, chest pressure, recent fever or illness.  He follows with cardiology and had a nuclear stress test in 03/2019 without clear evidence of blockage.  He has been taking amiodarone, carvedilol, hydralazine, and Imdur as directed and reports no recent increases in dose.  The emergency department, glucose was greater than 1000, CO2 20 gap of 17 and pH 7.2.  He was also bradycardic with heart rate 30s-40s and junctional rhythm without hypotension.  Potassium was 6.1 and he was given 2 g of calcium gluconate and started on an insulin gtt. work-up also significant for an acute on chronic kidney injury with creatinine of 3.2 (baseline 1.4-1.7).  Corrected sodium is 130,  Past Medical History   has a past medical history of Asthma, Atrial fibrillation ([place]), Atrial flutter ([place]), Cataract, Chronic rhinitis, Colon polyp,  Congestive heart failure ([place]), Diverticulosis, Hyperlipidemia, Hypertension, Morbid obesity ([place]), Nonischemic cardiomyopathy ([place]), Seasonal allergies, Sleep apnea, and Type 2 diabetes mellitus ([place]).  Significant Hospital Events   3/20 admit to PCCM  Consults:  Cardiology  Procedures:    Significant Diagnostic Tests:  3/20 CXR>> pending  Micro Data:  3/28 RVP>> negative  Antimicrobials:     Interim history/subjective:  Remains bradycardic however denies shortness of breath, chest pain, dizziness or fatigue  Objective   Blood pressure 115/79, pulse (!) 51, temperature 97.7 F (36.5 C), temperature source Axillary, resp. rate 13, height 6\' 1"  (1.854 m), weight (!) 137.3 kg, SpO2 95 %.        Intake/Output Summary (Last 24 hours) at 09/05/2019 0937 Last data filed at 09/05/2019 0400 Gross per 24 hour  Intake 1344.81 ml  Output 4800 ml  Net -3455.19 ml   Filed Weights   09/02/19 2129 09/03/19 0327 09/04/19 0500  Weight: 129.3 kg (!) 136.1 kg (!) 137.3 kg   Physical Exam: General: Obese, well-appearing, no acute distress HENT: , AT, OP clear, MMM Eyes: EOMI, no scleral icterus Respiratory: Clear to auscultation bilaterally.  No crackles, wheezing or rales Cardiovascular: bradycardic, -M/R/G, no JVD GI: BS+, soft, nontender Extremities:-Edema,-tenderness Neuro: AAO x4, CNII-XII grossly intact Skin: Intact, no rashes or bruising Psych: Normal mood, normal affect  Resolved Hospital Problem list     Assessment & Plan:   Hyperglycemia Poorly controlled DM2 Hyperosmolar hyperglycemic state - off insulin gtt P: -Increase Levemir to 35U BID -Continue aspart 5U TID with meals -SSI -Consult DM coordinator for assistance  with insulin management and DM education  Bradycardia Hx Afib/flutter Chronic systolic and diastolic heart failure HTN -Status post ablation many years ago and multiple cardioversions -Anticoagulated on Pradaxa -Recent nuclear stress test  without signs of occlusion -EF 40-45%, global hypokinesis, LVH, grade I DD P: -Consulted Cardiology. Off dopamine gtt. Recommend avoiding AV nodal agents and amio while inpatient. Scheduled on 3/31 for follow-up -Telemetry -Holding Imdur and Lasix in the setting of bradycardia and volume depletion  Nonoliguric AKI -improving -creatinine of 3.2 (baseline 1.4-1.7) -Suspect this is likely due to prerenal volume secondary to hyperglycemia P: -Monitor UOP/Cr  Hypothyroidism Increased TSH and low T4 P: -Continue Synthroid   OSA -Continue home CPAP hs  Asthma -Continue home Dulera  Best practice:  Diet: Heart healthy low carb Pain/Anxiety/Delirium protocol (if indicated): n/a VAP protocol (if indicated): n/a DVT prophylaxis: lovenox GI prophylaxis: n/a Glucose control: As above Mobility: With assist Code Status: Full code Family Communication: Updated patient at bedside Disposition: ICU. Potentially transfer to floor pending glucose control  Labs   CBC: Recent Labs  Lab 09/02/19 2142 09/02/19 2326 09/02/19 2332 09/03/19 0329 09/04/19 0755  WBC 7.0  --   --  8.0 6.4  HGB 15.1 16.3 14.3 14.9 15.8  HCT 44.2 48.0 42.0 41.3 45.6  MCV 91.7  --   --  85.9 90.1  PLT 196  --   --  182 [id number]    Basic Metabolic Panel: Recent Labs  Lab 09/03/19 0329 09/03/19 0807 09/04/19 0755 09/04/19 1557 09/04/19 2040 09/05/19 0036 09/05/19 0349  NA 124*   < > 135 130* 128* 130* 132*  K 3.9   < > 3.3* 4.2 4.2 4.3 4.2  CL 88*   < > 101 97* 98 99 100  CO2 23   < > 28 23 22 23 24   GLUCOSE 551*   < > 155* 360* 432* 452* 390*  BUN 54*   < > 28* 27* 29* 29* 29*  CREATININE 2.71*   < > 1.69* 1.71* 1.97* 2.01* 1.97*  CALCIUM 9.4   < > 8.9 8.8* 8.6* 8.8* 8.7*  MG 2.4  --  2.3  --   --   --   --   PHOS 4.1  --  3.1  --   --   --   --    < > = values in this interval not displayed.   GFR: Estimated Creatinine Clearance: 55.1 mL/min (A) (by C-G formula based on SCr of 1.97 mg/dL  (H)). Recent Labs  Lab 09/02/19 2142 09/03/19 0329 09/04/19 0755  WBC 7.0 8.0 6.4    Liver Function Tests: Recent Labs  Lab 09/04/19 0755  AST 28  ALT 22  ALKPHOS 66  BILITOT 1.0  PROT 6.5  ALBUMIN 3.2*   No results for input(s): LIPASE, AMYLASE in the last 168 hours. No results for input(s): AMMONIA in the last 168 hours.  ABG    Component Value Date/Time   PHART 7.435 03/14/2009 1020   PCO2ART 36.1 03/14/2009 1020   PO2ART 52.0 (L) 03/14/2009 1020   HCO3 18.3 (L) 09/02/2019 2332   TCO2 19 (L) 09/02/2019 2332   ACIDBASEDEF 7.0 (H) 09/02/2019 2332   O2SAT 84.0 09/02/2019 2332     Coagulation Profile: No results for input(s): INR, PROTIME in the last 168 hours.  Cardiac Enzymes: No results for input(s): CKTOTAL, CKMB, CKMBINDEX, TROPONINI in the last 168 hours.  HbA1C: Hgb A1c MFr Bld  Date/Time Value Ref Range Status  09/04/2019 07:55  AM 14.2 (H) 4.8 - 5.6 % Final    Comment:    (NOTE) Pre diabetes:          5.7%-6.4% Diabetes:              >6.4% Glycemic control for   <7.0% adults with diabetes   03/08/2019 09:46 AM 6.9 (H) 4.6 - 6.5 % Final    Comment:    Glycemic Control Guidelines for People with Diabetes:Non Diabetic:  <6%Goal of Therapy: <7%Additional Action Suggested:  >8%     CBG: Recent Labs  Lab 09/04/19 0947 09/04/19 1145 09/04/19 1535 09/04/19 2159 09/05/19 0915  GLUCAP 171* 338* 372* 393* 433*    Critical care time: 32 minutes    The patient is critically ill with multiple organ systems failure and requires high complexity decision making for assessment and support, frequent evaluation and titration of therapies, application of advanced monitoring technologies and extensive interpretation of multiple databases.   Critical Care Time devoted to patient care services described in this note is 32 Minutes. This time reflects time of care of this signee Dr. Rodman Pickle.   Rodman Pickle, M.D. [facility] Pulmonary/Critical Care  Medicine 09/05/2019 9:37 AM   Please see Amion for pager number to reach on-call Pulmonary and Critical Care Team.

## 2019-09-05 NOTE — Progress Notes (Signed)
Progress Note  Patient Name: [name] Date of Encounter: 09/05/2019  Primary Cardiologist: Buford Dresser, MD   Subjective   Long discussion at bedside today reviewed events. He notes that he has been drinking Monster energy drinks every morning, eating cereal, drinking chocolate milk at home, etc. He thought he only had prediabetes and that the glipizide was keeping things under control for him. He denies any infectious symptoms. He was in his usual state of health until Friday, when he felt unsteady. He had an episode at the gas station when he bent over to put air in his tires and rolled to the ground. He did not lose consciousness and with assistance was able to get back into his car and drive home. Once home he continued to feel poorly and sought medical attention at that time.  He does not have a home glucose monitor and does not follow home sugars. He does use his CPAP at home.   I discussed his telemetry with him. His carvedilol and amiodarone have been on hold since admission, and he has recently been sinus bradycardia in the mid 50s but has spikes to 80 bpm. He denies ever having syncope. No chest pain. Breathing has been stable, notes that he only uses symbicort PRN at home.   Inpatient Medications    Scheduled Meds: . Chlorhexidine Gluconate Cloth  6 each Topical Q0600  . dabigatran  150 mg Oral BID  . folic acid  1 mg Oral Daily  . gabapentin  600 mg Oral BID  . insulin aspart  0-20 Units Subcutaneous TID WC  . insulin aspart  0-5 Units Subcutaneous QHS  . insulin aspart  5 Units Subcutaneous TID WC  . insulin detemir  30 Units Subcutaneous BID  . levothyroxine  50 mcg Oral QAC breakfast  . mouth rinse  15 mL Mouth Rinse BID  . mometasone-formoterol  2 puff Inhalation BID  . multivitamin with minerals  1 tablet Oral Daily  . rosuvastatin  10 mg Oral q1800  . sodium chloride flush  3 mL Intravenous Once  . thiamine  100 mg Oral Daily   Or  . thiamine   100 mg Intravenous Daily   Continuous Infusions:  PRN Meds: albuterol, dextrose, doxylamine (Sleep), LORazepam **OR** LORazepam, traMADol   Vital Signs    Vitals:   09/05/19 0409 09/05/19 0600 09/05/19 0757 09/05/19 0822  BP:  115/79    Pulse:  (!) 51    Resp:  13    Temp: 97.6 F (36.4 C)  97.7 F (36.5 C)   TempSrc: Axillary  Axillary   SpO2:  96%  95%  Weight:      Height:        Intake/Output Summary (Last 24 hours) at 09/05/2019 0841 Last data filed at 09/05/2019 0400 Gross per 24 hour  Intake 1344.81 ml  Output 4800 ml  Net -3455.19 ml   Last 3 Weights 09/04/2019 09/03/2019 09/02/2019  Weight (lbs) 302 lb 11.1 oz 300 lb 0.7 oz 285 lb  Weight (kg) 137.3 kg 136.1 kg 129.275 kg      Telemetry    Largely sinus bradycardia in the mid-50s in the last 24 hours, peak 80 bpm. Occasional ventricular beats - Personally Reviewed  ECG    3/21 sinus bradycardia, 1st degree AV block, IVCD - Personally Reviewed  Physical Exam   GEN: No acute distress.   Neck: No JVD Cardiac: RRR, no murmurs, rubs, or gallops.  Respiratory: Clear to auscultation bilaterally.  GI: Soft, nontender, non-distended  MS: No edema; No deformity. Neuro:  Nonfocal  Psych: Normal affect   Labs    High Sensitivity Troponin:   Recent Labs  Lab 09/03/19 0006 09/03/19 0329 09/03/19 1206  TROPONINIHS 61* 84* 82*      Chemistry Recent Labs  Lab 09/04/19 0755 09/04/19 1557 09/04/19 2040 09/05/19 0036 09/05/19 0349  NA 135   < > 128* 130* 132*  K 3.3*   < > 4.2 4.3 4.2  CL 101   < > 98 99 100  CO2 28   < > 22 23 24   GLUCOSE 155*   < > 432* 452* 390*  BUN 28*   < > 29* 29* 29*  CREATININE 1.69*   < > 1.97* 2.01* 1.97*  CALCIUM 8.9   < > 8.6* 8.8* 8.7*  PROT 6.5  --   --   --   --   ALBUMIN 3.2*  --   --   --   --   AST 28  --   --   --   --   ALT 22  --   --   --   --   ALKPHOS 66  --   --   --   --   BILITOT 1.0  --   --   --   --   GFRNONAA 42*   < > 35* 34* 35*  GFRAA 49*   < >  40* 39* 40*  ANIONGAP 6   < > 8 8 8    < > = values in this interval not displayed.     Hematology Recent Labs  Lab 09/02/19 2142 09/02/19 2326 09/02/19 2332 09/03/19 0329 09/04/19 0755  WBC 7.0  --   --  8.0 6.4  RBC 4.82  --   --  4.81 5.06  HGB 15.1   < > 14.3 14.9 15.8  HCT 44.2   < > 42.0 41.3 45.6  MCV 91.7  --   --  85.9 90.1  MCH 31.3  --   --  31.0 31.2  MCHC 34.2  --   --  36.1* 34.6  RDW 13.8  --   --  12.7 13.2  PLT 196  --   --  182 174   < > = values in this interval not displayed.    BNPNo results for input(s): BNP, PROBNP in the last 168 hours.   DDimer No results for input(s): DDIMER in the last 168 hours.   Radiology    CXR 09/03/19 no acute processes  Cardiac Studies   Echo 09/04/19 1. Left ventricular ejection fraction, by estimation, is 40 to 45%. The  left ventricle has mildly decreased function. The left ventricle  demonstrates global hypokinesis. There is severe left ventricular  hypertrophy, asymmetric and septal predominant  (parasternal views of the septum raise question of HOCM or infiltrative  cardiomyopathy - cardiac MRI could be considered if clinically  appropriate). Left ventricular diastolic parameters are consistent with  Grade I diastolic dysfunction (impaired  relaxation).  2. Right ventricular systolic function is normal. The right ventricular  size is normal. Tricuspid regurgitation signal is inadequate for assessing  PA pressure.  3. Left atrial size was mildly dilated.  4. The mitral valve is grossly normal. Trivial mitral valve  regurgitation.  5. The aortic valve was not well visualized. Aortic valve regurgitation  is not visualized.  6. Aortic dilatation noted. There is mild dilatation of the ascending  aorta.  7. The inferior vena cava is normal in size with greater than 50%  respiratory variability, suggesting right atrial pressure of 3 mmHg.   Patient Profile     64 y.o. male with PMH chronic systolic and  diastolic heart failure likely nonischemic cardiomyopathy, atrial fibrillation/atrial flutter s/p prior ablation, hypertension, OSA on CPAP, type II diabetes, hypothyroidism who was admitted for DKA and found to have junctional bradycardia on admission.  Assessment & Plan    Bradycardia: now sinus brady at 55 bpm, with augmentation up to 80 bpm. Has not been significantly mobile since admission -initial bradycardia likely due to hyperosmolar hyperglycemia with electrolyte abnormalities -required dopamine initially, now maintaining heart rate without this -I would continue to hold his amiodarone and carvedilol at discharge. He already has a follow up scheduled with me 3/31. We will get an ECG at that time and also likely arrange for an outpatient event monitor -he has not had syncope. We discussed this is a red flag sign that needs immediate medical attention -he does not require pacemaker at this time, but would hold AV nodal agents.  History of atrial fibrillation/atrial flutter:  -hold amiodarone and carvedilol as above -continue dabigatran  History of chronic systolic and diastolic heart failure due to nonischemic cardiomyopathy: -EF slightly improved on echo this admission -no beta blocker given bradycardia -does not appear volume up on exam -blood pressures have been soft for him. Holding home antihypertensives -may benefit from SGLT2i or GLP1RA as an outpatient  Acute kidney injury: likely due to hyperosmolar hyperglycemic state/volume depletion -Cr 3.24 on admission, down to 1.97 today -potassium on admission 6.1, 4.2 today  Type II diabetes: with hyperosemolar hyperglycemia on admission -we discussed diet changes today. A1c 14.2. Sugar on admission 1186, this AM was 390 -he will need the diabetes educator while admitted. Does not have a home glucose monitor. Will need extensive teaching on diet, lifestyle, medications, keeping a log  Hypothyroidism: -TSH 33, fT4 0.43 this  admission -may be possibly involved in his bradycardia, will need follow up as an outpatient  OSA on CPAP: -using in the hospital, reports compliance at home  For questions or updates, please contact [facility] Please consult [email or web address] for contact info under     Signed, Buford Dresser, MD  09/05/2019, 8:41 AM

## 2019-09-05 NOTE — Progress Notes (Addendum)
Inpatient Diabetes Program Recommendations  AACE/ADA: New Consensus Statement on Inpatient Glycemic Control (2015)  Target Ranges:  Prepandial:   less than 140 mg/dL      Peak postprandial:   less than 180 mg/dL (1-2 hours)      Critically ill patients:  140 - 180 mg/dL   Results for NAKAI, POLLIO (MRN [id number]) as of 09/05/2019 07:24  Ref. Range 09/04/2019 03:02 09/04/2019 03:54 09/04/2019 05:04 09/04/2019 06:02 09/04/2019 06:57 09/04/2019 07:33 09/04/2019 08:47 09/04/2019 09:47 09/04/2019 11:45 09/04/2019 15:35 09/04/2019 21:59  Glucose-Capillary Latest Ref Range: 70 - 99 mg/dL 153 (H)  IV Insulin Drip 129 (H)  IV Insulin Drip 126 (H)  IV Insulin Drip 142 (H)  IV Insulin Drip 147 (H)  IV Insulin Drip 171 (H)  22 units LEVEMIR given at 7:49am 148 (H) 171 (H)  IV Insulin Drip stopped at 10am 338 (H)  11 units NOVOLOG  372 (H)  15 units NOVOLOG  393 (H)  5 units NOVOLOG +  30 units LEVEMIR   Results for GILLIAN, KLUEVER (MRN [id number]) as of 09/05/2019 10:31  Ref. Range 09/05/2019 09:15  Glucose-Capillary Latest Ref Range: 70 - 99 mg/dL 433 (H)  20 units NOVOLOG +  30 units LEVEMIR   Results for MELESIO, MADARA (MRN [id number]) as of 09/05/2019 07:24  Ref. Range 09/02/2019 21:42  Glucose Latest Ref Range: 70 - 99 mg/dL 1,186 [facility])   Results for KELSIE, KRAMP (MRN [id number]) as of 09/05/2019 07:24  Ref. Range 03/08/2019 09:46 09/04/2019 07:55  Hemoglobin A1C Latest Ref Range: 4.8 - 5.6 % 6.9 (H) 14.2 (H)  (360 mg/dl)    Admit with: HHNK + Bradycardia (A Fib/ A Flutter)  History: DM  Home DM Meds: Glipizide 5 mg BID  Current Orders: Levemir 35 units BID      Novolog Resistant Correction Scale/ SSI (0-20 units) TID AC + HS      Novolog 5 units TID with meals   PCP: Dr. [name]    Note patient transitioned off the IV Insulin Drip yesterday AM.  Patient received total of 52 units Levemir yesterday--Will receive total of 65 units Levemir  today.  Novolog Meal Coverage to start today as well.      MD- May consider increasing the frequency of the Novolog SSI to Q4 hours while CBGs remain severely elevated.  Would continue Novolog Meal Coverage and current Levemir orders.  Will need Insulin for home given his A1c is 14.2%.    Addendum 1pm--  Met with pt today to discuss current A1c of 14.2%, likely need for insulin for home, admission diagnosis, treatments, etc.  Discussed with patient diagnosis of HHNK (pathophysiology), treatment of HHNK, lab results, and transition plan to SQ insulin regimen.  Spoke with patient about his current A1c of 14.2%.  Explained what an A1c is and what it measures.  Reviewed with pt that his A1c rose significantly from 6.9% to over 14% and why this is so concerning.  Reminded patient that his goal A1c is 7% or less per ADA standards to prevent both acute and long-term complications.  Explained to patient the extreme importance of good glucose control at home.  Encouraged patient to check his CBGs at least TID before meals and at bedtime at home and to record all CBGs for his PCP to review.  Also discussed w/ pt that he may want to investigate getting a Freestyle Libre CGM with the help of his PCP after d/c to help him improve  his consistency with checking CBGs at home.  Has CBG meter at home but stated the lancet device is broken.  Told pt I will ask the MD to give him a Rx for a CBG meter kit at time of d/c.  Discussed w/ pt that the MDs will decide what and how much insulin to d/c him home on when that time arrives.  Discussed w/ pt that given his A1c was so high and given he is requiring such large doses of Insulin in the hospital that he will likely need insulin to go home on.  Pt wanted to know if he will need insulin long-term.  Discussed w/ pt that given all the current info I have, he will likely need insulin to go hom eon but I cannot predict if he will need insulin the rest of his life.  Explained  to pt that he must check his CBGs often at home (TID [facility] and at bedtime) and must regularly see a physician to frequently keep his diabetes under check.  Encouraged pt to seek care under an Endocrinologist after d/c for more intensive diabetes care.  Explained basic pathophysiology of DM Type 2, basic home care, basic diabetes diet nutrition principles, importance of checking CBGs and maintaining good CBG control to prevent long-term and short-term complications.  Reviewed signs and symptoms of hyperglycemia and hypoglycemia and how to treat hypoglycemia at home.  Also reviewed blood sugar goals and A1c goals for home.  Also discussed DM diet information with patient.  Encouraged patient to avoid beverages with sugar (regular soda, sweet tea, lemonade, fruit juice) and to consume mostly water.  Discussed what foods contain carbohydrates and how carbohydrates affect the body's blood sugar levels.  Encouraged patient to be careful with his portion sizes (especially grains, starchy vegetables, and fruits).  Explained to patient that men should have 60-75 grams of carbohydrates per meal per day.  Also discussed w/ pt how alcohol affects CBGs.  Encouraged pt to limit alcohol to 1 serving per day and to make sure he eats when drinking and takes his insulin.  Also encouraged pt to carry a low blood sugar treatment with him when he drinks at a bar in case he has a HYPO event when out drinking.  RNs to provide ongoing basic DM education at bedside with this patient.  Have ordered educational booklet, insulin starter kit, and DM videos.  Have also placed RD consult for DM diet education for this patient.  Educated patient on insulin pen use at home.  Reviewed contents of insulin flexpen starter kit.  Reviewed all steps of insulin pen including attachment of needle, 2-unit air shot, dialing up dose, giving injection, rotation of injection sites, removing needle, disposal of sharps, storage of unused insulin, disposal of  insulin etc.  Patient able to provide successful return demonstration.  Reviewed troubleshooting with insulin pen.       --Will follow patient during hospitalization--  Wyn Quaker RN, MSN, CDE Diabetes Coordinator Inpatient Glycemic Control Team Team Pager: [phone] (8a-5p)

## 2019-09-05 NOTE — Progress Notes (Signed)
Nutrition Education Note  RD consulted for nutrition education regarding diabetes.  Patient states that he is single and does not cook. He eats out a lot and uses the microwave to prepare meals.   Lab Results  Component Value Date   HGBA1C 14.2 (H) 09/04/2019    RD provided "Carbohydrate Counting for People with Diabetes" handout from the Academy of Nutrition and Dietetics. Discussed different food groups and their effects on blood sugar, emphasizing carbohydrate-containing foods. Provided list of carbohydrates and recommended serving sizes of common foods.  Discussed importance of controlled and consistent carbohydrate intake throughout the day. Provided examples of ways to balance meals/snacks and encouraged intake of high-fiber, whole grain complex carbohydrates. Teach back method used.  Expect good compliance.  Body mass index is 39.94 kg/m. Pt meets criteria for obesity based on current BMI.  Current diet order is heart healthy CHO modified, patient is consuming approximately 100% of meals at this time. Labs and medications reviewed. No further nutrition interventions warranted at this time. RD contact information provided. If additional nutrition issues arise, please re-consult RD.  Molli Barrows, RD, LDN, CNSC Please refer to [facility] for contact information.

## 2019-09-06 ENCOUNTER — Encounter (HOSPITAL_COMMUNITY): Payer: Self-pay | Admitting: Student

## 2019-09-06 DIAGNOSIS — E1169 Type 2 diabetes mellitus with other specified complication: Secondary | ICD-10-CM | POA: Diagnosis not present

## 2019-09-06 DIAGNOSIS — E785 Hyperlipidemia, unspecified: Secondary | ICD-10-CM | POA: Diagnosis not present

## 2019-09-06 DIAGNOSIS — N1832 Chronic kidney disease, stage 3b: Secondary | ICD-10-CM

## 2019-09-06 DIAGNOSIS — E875 Hyperkalemia: Secondary | ICD-10-CM | POA: Diagnosis not present

## 2019-09-06 DIAGNOSIS — Z8679 Personal history of other diseases of the circulatory system: Secondary | ICD-10-CM | POA: Diagnosis not present

## 2019-09-06 DIAGNOSIS — N179 Acute kidney failure, unspecified: Secondary | ICD-10-CM | POA: Diagnosis not present

## 2019-09-06 DIAGNOSIS — R001 Bradycardia, unspecified: Secondary | ICD-10-CM | POA: Diagnosis not present

## 2019-09-06 DIAGNOSIS — I5032 Chronic diastolic (congestive) heart failure: Secondary | ICD-10-CM

## 2019-09-06 DIAGNOSIS — E1100 Type 2 diabetes mellitus with hyperosmolarity without nonketotic hyperglycemic-hyperosmolar coma (NKHHC): Secondary | ICD-10-CM | POA: Diagnosis not present

## 2019-09-06 DIAGNOSIS — E039 Hypothyroidism, unspecified: Secondary | ICD-10-CM

## 2019-09-06 DIAGNOSIS — G4733 Obstructive sleep apnea (adult) (pediatric): Secondary | ICD-10-CM

## 2019-09-06 LAB — GLUCOSE, CAPILLARY
Glucose-Capillary: 259 mg/dL — ABNORMAL HIGH (ref 70–99)
Glucose-Capillary: 262 mg/dL — ABNORMAL HIGH (ref 70–99)
Glucose-Capillary: 283 mg/dL — ABNORMAL HIGH (ref 70–99)
Glucose-Capillary: 280 mg/dL — ABNORMAL HIGH (ref 70–99)

## 2019-09-06 LAB — BASIC METABOLIC PANEL
Anion gap: 7 (ref 5–15)
BUN: 22 mg/dL (ref 8–23)
CO2: 21 mmol/L — ABNORMAL LOW (ref 22–32)
Calcium: 8.5 mg/dL — ABNORMAL LOW (ref 8.9–10.3)
Chloride: 106 mmol/L (ref 98–111)
Creatinine, Ser: 1.56 mg/dL — ABNORMAL HIGH (ref 0.61–1.24)
GFR calc Af Amer: 54 mL/min — ABNORMAL LOW (ref >60)
GFR calc non Af Amer: 46 mL/min — ABNORMAL LOW (ref >60)
Glucose, Bld: 225 mg/dL — ABNORMAL HIGH (ref 70–99)
Potassium: 3.8 mmol/L (ref 3.5–5.1)
Sodium: 134 mmol/L — ABNORMAL LOW (ref 135–145)

## 2019-09-06 LAB — CBC
HCT: 43.5 % (ref 39.0–52.0)
Hemoglobin: 14.7 g/dL (ref 13.0–17.0)
MCH: 30.7 pg (ref 26.0–34.0)
MCHC: 33.8 g/dL (ref 30.0–36.0)
MCV: 90.8 fL (ref 80.0–100.0)
Platelets: 126 10*3/uL — ABNORMAL LOW (ref 150–400)
RBC: 4.79 MIL/uL (ref 4.22–5.81)
RDW: 13.2 % (ref 11.5–15.5)
WBC: 4.9 10*3/uL (ref 4.0–10.5)
nRBC: 0 % (ref 0.0–0.2)

## 2019-09-06 LAB — T4, FREE: Free T4: 0.5 ng/dL — ABNORMAL LOW (ref 0.61–1.12)

## 2019-09-06 LAB — MAGNESIUM: Magnesium: 2.1 mg/dL (ref 1.7–2.4)

## 2019-09-06 MED ORDER — INSULIN DETEMIR 100 UNIT/ML ~~LOC~~ SOLN
40.0000 [IU] | Freq: Two times a day (BID) | SUBCUTANEOUS | Status: DC
  Administered 2019-09-06 – 2019-09-07 (×2): 40 [IU] via SUBCUTANEOUS
  Filled 2019-09-06 (×3): qty 0.4

## 2019-09-06 NOTE — Progress Notes (Signed)
Reviewed self injection for insulin with patient for pre lunch and dinner scheduled insulin.  Patient able to complete self sq insulin injection without problems.

## 2019-09-06 NOTE — Progress Notes (Signed)
CPAP set up at bedside with FFM, 8.0 cm H20.  Patient aware to call for assistance if needed.

## 2019-09-06 NOTE — Progress Notes (Signed)
Inpatient Diabetes Program Recommendations  AACE/ADA: New Consensus Statement on Inpatient Glycemic Control (2015)  Target Ranges:  Prepandial:   less than 140 mg/dL      Peak postprandial:   less than 180 mg/dL (1-2 hours)      Critically ill patients:  140 - 180 mg/dL   Lab Results  Component Value Date   GLUCAP 262 (H) 09/06/2019   HGBA1C 14.2 (H) 09/04/2019    Spoke with pt at RN and pt request to answer some questions pt had.   Pt interested in the freestyle libre continuous glucose sensor to prevent himself from sticking his fingers so much.  Pt has questions about the type of insulin and when to give each one. Discussed each one in detail. Spoke about A1c level again, and A1c and glucose goals.  Discussed Endocrinologists. Pt concerned his PCP will not manage his DM well.   Showed and discussed the insulin pen with pt.  Consider adding the Freestyle Libre sensor for discharge. Will need 2 sensors.  Freestyle Libre 14 Day Sensor Misc order # [id number]  Thanks,  Tama Headings RN, MSN, BC-ADM Inpatient Diabetes Coordinator Team Pager [phone] (8a-5p)

## 2019-09-06 NOTE — Progress Notes (Signed)
Inpatient Diabetes Program Recommendations  AACE/ADA: New Consensus Statement on Inpatient Glycemic Control (2015)  Target Ranges:  Prepandial:   less than 140 mg/dL      Peak postprandial:   less than 180 mg/dL (1-2 hours)      Critically ill patients:  140 - 180 mg/dL   Lab Results  Component Value Date   GLUCAP 259 (H) 09/06/2019   HGBA1C 14.2 (H) 09/04/2019    Review of Glycemic Control Results for [name], [name] (MRN [id number]) as of 09/06/2019 09:52  Ref. Range 09/05/2019 11:36 09/05/2019 17:12 09/05/2019 21:45 09/06/2019 07:27  Glucose-Capillary Latest Ref Range: 70 - 99 mg/dL 471 (H) 297 (H) 239 (H) 259 (H)   Diabetes history: Type 2 DM Outpatient Diabetes medications: Glipizide 5 mg BID Current orders for Inpatient glycemic control: Levemir 35 units BID, Novolog 0-20 units TID, Novolog 0-5 units QHS, Novolog 5 units TID  Inpatient Diabetes Program Recommendations:    Consider increasing Levemir to 40 units BID. Secure chat sent to MD.  Additionally, sent secure chat to RN to continue working with patient on self injections while inpatient and placed outpatient referral for outpatient diabetes education.   Thanks, Bronson Curb, MSN, RNC-OB Diabetes Coordinator [phone] (8a-5p)

## 2019-09-06 NOTE — Progress Notes (Signed)
PROGRESS NOTE    [name]  [id number]  DOB: [birth year]  PCP: [name], MD Admit date:09/02/2019  64 year old male with a past medical history significant of atrial fibrillation/flutter status post ablation and multiple cardioversions on Pradaxa, HFrEF EF of 35 to 40%, asthma, type 2 diabetes, hyperlipidemia, hypertension and OSA who woke up feeling lightheaded with weakness in both legs.  He "fell" after his legs gave out and he sat down in a chair, did not hit his head.  He noted some transient left hand tingling, no focal extremity weakness, along with intermittent blurred vision. He is on glipizide at home, does not usually check his blood sugar. He follows with cardiology and had a nuclear stress test in 03/2019 without clear evidence of blockage.  He reported taking amiodarone, carvedilol, hydralazine, and Imdur as directed and reports no recent med changes. ED Course: Afebrile,glucose was greater than 1000, CO2 20 gap of 17 and pH 7.2, creatinine of 3.2 (baseline 1.4-1.7).  He was also bradycardic with heart rate 30s-40s and junctional rhythm without hypotension.  Sodium 113 (Corrected sodium is 130) Potassium 6.1 and he was given 2 g of calcium gluconate and started on an insulin gtt.  Hospital course: Patient admitted to [facility] service on 3/20 with cardiology consultation for symptomatic bradycardia. Started on dopamine drip and heart rate is better now. Now Off dopamine. He was also found to be hyperglycemic with hyperosmolar state on presentation and started on insulin drip. Still hyperglycemic.Does not use insulin at home, needs insulin on discharge.  Transferred to [facility] service 3/23.  Subjective:  Patient in no acute distress, still not comfortable with insulin regimen/self administration. States he could not get through to his PCP office for synthroid refill and wasn't taking for 2 weeks. BG still up  Objective: Vitals:   09/05/19 2035 09/06/19 0548 09/06/19 0549  09/06/19 0736  BP: (!) 152/88   126/88  Pulse: (!) 53   (!) 54  Resp:    14  Temp: 97.7 F (36.5 C) 97.6 F (36.4 C) 97.6 F (36.4 C)   TempSrc: Oral Axillary Oral   SpO2: 99%   97%  Weight: 135.9 kg 135.6 kg    Height: 6\' 1"  (1.854 m)       Intake/Output Summary (Last 24 hours) at 09/06/2019 0808 Last data filed at 09/06/2019 0741 Gross per 24 hour  Intake 1920 ml  Output 700 ml  Net 1220 ml   Filed Weights   09/04/19 0500 09/05/19 2035 09/06/19 0548  Weight: (!) 137.3 kg 135.9 kg 135.6 kg    Physical Examination:  General exam: Appears calm and comfortable  Respiratory system: Clear to auscultation. Respiratory effort normal. Cardiovascular system: S1 & S2 heard,bradycardic. No JVD, murmurs, rubs, gallops or clicks. No pedal edema. Gastrointestinal system: Abdomen is nondistended, soft and nontender. Normal bowel sounds heard. Central nervous system: Alert and oriented. No new focal neurological deficits. Extremities: No contractures, edema or joint deformities.  Skin: No rashes, lesions or ulcers Psychiatry: Judgement and insight appear normal. Mood & affect appropriate.   Data Reviewed: I have personally reviewed following labs and imaging studies  CBC: Recent Labs  Lab 09/02/19 2142 09/02/19 2142 09/02/19 2326 09/02/19 2332 09/03/19 0329 09/04/19 0755 09/06/19 0330  WBC 7.0  --   --   --  8.0 6.4 4.9  HGB 15.1   < > 16.3 14.3 14.9 15.8 14.7  HCT 44.2   < > 48.0 42.0 41.3 45.6 43.5  MCV 91.7  --   --   --  85.9 90.1 90.8  PLT 196  --   --   --  182 174 126*   < > = values in this interval not displayed.   Basic Metabolic Panel: Recent Labs  Lab 09/03/19 0329 09/03/19 0807 09/04/19 0755 09/04/19 0755 09/04/19 1557 09/04/19 2040 09/05/19 0036 09/05/19 0349 09/06/19 0330  NA 124*   < > 135   < > 130* 128* 130* 132* 134*  K 3.9   < > 3.3*   < > 4.2 4.2 4.3 4.2 3.8  CL 88*   < > 101   < > 97* 98 99 100 106  CO2 23   < > 28   < > 23 22 23 24  21*    GLUCOSE 551*   < > 155*   < > 360* 432* 452* 390* 225*  BUN 54*   < > 28*   < > 27* 29* 29* 29* 22  CREATININE 2.71*   < > 1.69*   < > 1.71* 1.97* 2.01* 1.97* 1.56*  CALCIUM 9.4   < > 8.9   < > 8.8* 8.6* 8.8* 8.7* 8.5*  MG 2.4  --  2.3  --   --   --   --   --  2.1  PHOS 4.1  --  3.1  --   --   --   --   --   --    < > = values in this interval not displayed.   GFR: Estimated Creatinine Clearance: 69.2 mL/min (A) (by C-G formula based on SCr of 1.56 mg/dL (H)). Liver Function Tests: Recent Labs  Lab 09/04/19 0755  AST 28  ALT 22  ALKPHOS 66  BILITOT 1.0  PROT 6.5  ALBUMIN 3.2*   No results for input(s): LIPASE, AMYLASE in the last 168 hours. No results for input(s): AMMONIA in the last 168 hours. Coagulation Profile: No results for input(s): INR, PROTIME in the last 168 hours. Cardiac Enzymes: No results for input(s): CKTOTAL, CKMB, CKMBINDEX, TROPONINI in the last 168 hours. BNP (last 3 results) Recent Labs    03/08/19 0946  PROBNP 215.0*   HbA1C: Recent Labs    09/04/19 0755  HGBA1C 14.2*   CBG: Recent Labs  Lab 09/05/19 0915 09/05/19 1136 09/05/19 1712 09/05/19 2145 09/06/19 0727  GLUCAP 433* 471* 297* 239* 259*   Lipid Profile: No results for input(s): CHOL, HDL, LDLCALC, TRIG, CHOLHDL, LDLDIRECT in the last 72 hours. Thyroid Function Tests: No results for input(s): TSH, T4TOTAL, FREET4, T3FREE, THYROIDAB in the last 72 hours. Anemia Panel: No results for input(s): VITAMINB12, FOLATE, FERRITIN, TIBC, IRON, RETICCTPCT in the last 72 hours. Sepsis Labs: No results for input(s): PROCALCITON, LATICACIDVEN in the last 168 hours.  Recent Results (from the past 240 hour(s))  SARS CORONAVIRUS 2 (TAT 6-24 HRS)     Status: None   Collection Time: 09/02/19 11:18 PM  Result Value Ref Range Status   SARS Coronavirus 2 NEGATIVE NEGATIVE Final    Comment: (NOTE) SARS-CoV-2 target nucleic acids are NOT DETECTED. The SARS-CoV-2 RNA is generally detectable in upper  and lower respiratory specimens during the acute phase of infection. Negative results do not preclude SARS-CoV-2 infection, do not rule out co-infections with other pathogens, and should not be used as the sole basis for treatment or other patient management decisions. Negative results must be combined with clinical observations, patient history, and epidemiological information. The expected result is Negative. Fact Sheet for Patients: SugarRoll.be Fact Sheet for Healthcare Providers: https://www.woods-mathews.com/  This test is not yet approved or cleared by the [place] and  has been authorized for detection and/or diagnosis of SARS-CoV-2 by FDA under an Emergency Use Authorization (EUA). This EUA will remain  in effect (meaning this test can be used) for the duration of the COVID-19 declaration under Section 56 4(b)(1) of the Act, 21 U.S.C. section 360bbb-3(b)(1), unless the authorization is terminated or revoked sooner. Performed at [facility], [place] [address] [place], [place] [address]   Respiratory Panel by RT PCR (Flu A&B, Covid) - Nasopharyngeal Swab     Status: None   Collection Time: 09/03/19  2:32 AM   Specimen: Nasopharyngeal Swab  Result Value Ref Range Status   SARS Coronavirus 2 by RT PCR NEGATIVE NEGATIVE Final    Comment: (NOTE) SARS-CoV-2 target nucleic acids are NOT DETECTED. The SARS-CoV-2 RNA is generally detectable in upper respiratoy specimens during the acute phase of infection. The lowest concentration of SARS-CoV-2 viral copies this assay can detect is 131 copies/mL. A negative result does not preclude SARS-Cov-2 infection and should not be used as the sole basis for treatment or other patient management decisions. A negative result may occur with  improper specimen collection/handling, submission of specimen other than nasopharyngeal swab, presence of viral mutation(s) within the areas targeted by  this assay, and inadequate number of viral copies (<131 copies/mL). A negative result must be combined with clinical observations, patient history, and epidemiological information. The expected result is Negative. Fact Sheet for Patients:  PinkCheek.be Fact Sheet for Healthcare Providers:  GravelBags.it This test is not yet ap proved or cleared by the [place] FDA and  has been authorized for detection and/or diagnosis of SARS-CoV-2 by FDA under an Emergency Use Authorization (EUA). This EUA will remain  in effect (meaning this test can be used) for the duration of the COVID-19 declaration under Section 564(b)(1) of the Act, 21 U.S.C. section 360bbb-3(b)(1), unless the authorization is terminated or revoked sooner.    Influenza A by PCR NEGATIVE NEGATIVE Final   Influenza B by PCR NEGATIVE NEGATIVE Final    Comment: (NOTE) The Xpert Xpress SARS-CoV-2/FLU/RSV assay is intended as an aid in  the diagnosis of influenza from Nasopharyngeal swab specimens and  should not be used as a sole basis for treatment. Nasal washings and  aspirates are unacceptable for Xpert Xpress SARS-CoV-2/FLU/RSV  testing. Fact Sheet for Patients: PinkCheek.be Fact Sheet for Healthcare Providers: GravelBags.it This test is not yet approved or cleared by the [place] FDA and  has been authorized for detection and/or diagnosis of SARS-CoV-2 by  FDA under an Emergency Use Authorization (EUA). This EUA will remain  in effect (meaning this test can be used) for the duration of the  Covid-19 declaration under Section 564(b)(1) of the Act, 21  U.S.C. section 360bbb-3(b)(1), unless the authorization is  terminated or revoked. Performed at [facility], [place] [address] [place], [place] [address]   MRSA PCR Screening     Status: None   Collection Time: 09/03/19  5:29 AM   Specimen:  Nasal Mucosa; Nasopharyngeal  Result Value Ref Range Status   MRSA by PCR NEGATIVE NEGATIVE Final    Comment:        The GeneXpert MRSA Assay (FDA approved for NASAL specimens only), is one component of a comprehensive MRSA colonization surveillance program. It is not intended to diagnose MRSA infection nor to guide or monitor treatment for MRSA infections. Performed at [facility], [address] [place], [place] [address]       Radiology Studies: ECHOCARDIOGRAM COMPLETE  Result Date: 09/04/2019    ECHOCARDIOGRAM REPORT   Patient Name:   ROBERTO GRAESER Date of Exam: 09/04/2019 Medical Rec #:  [name]       Height:       73.0 in Accession #:    [id number]      Weight:       302.7 lb Date of Birth:  October 05, 1955       BSA:          2.566 m Patient Age:    1 years        BP:           117/78 mmHg Patient Gender: M               HR:           53 bpm. Exam Location:  Inpatient Procedure: 2D Echo, Color Doppler, Cardiac Doppler and Intracardiac            Opacification Agent Indications:    R00.1 Bradycardia, unspecified  History:        Patient has prior history of Echocardiogram examinations, most                 recent 03/29/2019. CHF, Arrythmias:Atrial Fibrillation and                 Atrial Flutter; Risk Factors:Hypertension, Diabetes,                 Dyslipidemia and Sleep Apnea. COVID+ on 01/31/19.  Sonographer:    Raquel Sarna Senior RDCS Referring Phys: [id number] Francesca Jewett  Sonographer Comments: Technically difficult due to patient body habitus. IMPRESSIONS  1. Left ventricular ejection fraction, by estimation, is 40 to 45%. The left ventricle has mildly decreased function. The left ventricle demonstrates global hypokinesis. There is severe left ventricular hypertrophy, asymmetric and septal predominant (parasternal views of the septum raise question of HOCM or infiltrative cardiomyopathy - cardiac MRI could be considered if clinically appropriate). Left ventricular diastolic parameters  are consistent with Grade I diastolic dysfunction (impaired relaxation).  2. Right ventricular systolic function is normal. The right ventricular size is normal. Tricuspid regurgitation signal is inadequate for assessing PA pressure.  3. Left atrial size was mildly dilated.  4. The mitral valve is grossly normal. Trivial mitral valve regurgitation.  5. The aortic valve was not well visualized. Aortic valve regurgitation is not visualized.  6. Aortic dilatation noted. There is mild dilatation of the ascending aorta.  7. The inferior vena cava is normal in size with greater than 50% respiratory variability, suggesting right atrial pressure of 3 mmHg. FINDINGS  Left Ventricle: Left ventricular ejection fraction, by estimation, is 40 to 45%. The left ventricle has mildly decreased function. The left ventricle demonstrates global hypokinesis. Definity contrast agent was given IV to delineate the left ventricular  endocardial borders. The left ventricular internal cavity size was normal in size. There is severe left ventricular hypertrophy. Left ventricular diastolic parameters are consistent with Grade I diastolic dysfunction (impaired relaxation). Right Ventricle: The right ventricular size is normal. No increase in right ventricular wall thickness. Right ventricular systolic function is normal. Tricuspid regurgitation signal is inadequate for assessing PA pressure. Left Atrium: Left atrial size was mildly dilated. Right Atrium: Right atrial size was normal in size. Pericardium: There is no evidence of pericardial effusion. Mitral Valve: The mitral valve is grossly normal. Mild mitral annular calcification.  Trivial mitral valve regurgitation. Tricuspid Valve: The tricuspid valve is grossly normal. Tricuspid valve regurgitation is trivial. Aortic Valve: The aortic valve was not well visualized. Aortic valve regurgitation is not visualized. Mild aortic valve annular calcification. Pulmonic Valve: The pulmonic valve was  not well visualized. Pulmonic valve regurgitation is not visualized. Aorta: Aortic dilatation noted. There is mild dilatation of the ascending aorta. Venous: The inferior vena cava is normal in size with greater than 50% respiratory variability, suggesting right atrial pressure of 3 mmHg. IAS/Shunts: The interatrial septum appears to be lipomatous. No atrial level shunt detected by color flow Doppler.  LEFT VENTRICLE PLAX 2D LVIDd:         5.60 cm  Diastology LVIDs:         4.90 cm  LV e' lateral:   4.35 cm/s LV PW:         1.60 cm  LV E/e' lateral: 9.0 LV IVS:        2.10 cm  LV e' medial:    3.37 cm/s LVOT diam:     2.50 cm  LV E/e' medial:  11.6 LV SV:         63 LV SV Index:   25 LVOT Area:     4.91 cm  RIGHT VENTRICLE RV S prime:     14.60 cm/s TAPSE (M-mode): 2.3 cm LEFT ATRIUM             Index       RIGHT ATRIUM           Index LA diam:        5.30 cm 2.07 cm/m  RA Area:     22.30 cm LA Vol (A2C):   88.2 ml 34.37 ml/m RA Volume:   61.70 ml  24.05 ml/m LA Vol (A4C):   63.1 ml 24.59 ml/m LA Biplane Vol: 78.5 ml 30.59 ml/m  AORTIC VALVE LVOT Vmax:   67.20 cm/s LVOT Vmean:  45.400 cm/s LVOT VTI:    0.129 m  AORTA Ao Root diam: 3.60 cm Ao Asc diam:  4.20 cm MITRAL VALVE MV Area (PHT): 1.92 cm    SHUNTS MV Decel Time: 396 msec    Systemic VTI:  0.13 m MV E velocity: 39.00 cm/s  Systemic Diam: 2.50 cm MV A velocity: 52.30 cm/s MV E/A ratio:  0.75 Rozann Lesches MD Electronically signed by Rozann Lesches MD Signature Date/Time: 09/04/2019/11:20:12 AM    Final         Scheduled Meds: . Chlorhexidine Gluconate Cloth  6 each Topical Q0600  . dabigatran  150 mg Oral BID  . folic acid  1 mg Oral Daily  . gabapentin  600 mg Oral BID  . insulin aspart  0-20 Units Subcutaneous TID WC  . insulin aspart  0-5 Units Subcutaneous QHS  . insulin aspart  5 Units Subcutaneous TID WC  . insulin detemir  35 Units Subcutaneous BID  . levothyroxine  50 mcg Oral QAC breakfast  . mouth rinse  15 mL Mouth Rinse  BID  . mometasone-formoterol  2 puff Inhalation BID  . multivitamin with minerals  1 tablet Oral Daily  . rosuvastatin  10 mg Oral q1800  . sodium chloride flush  3 mL Intravenous Once  . thiamine  100 mg Oral Daily   Or  . thiamine  100 mg Intravenous Daily   Continuous Infusions:  Assessment/Plan:  1.  Hyperosmolar hyperglycemia, nonketotic state: Patient had blood glucose greater than thousand on presentation with bicarb of  20 and anion gap 17 but urine ketones negative, beta hydroxybutyrate level not elevated.  DKA ruled out.  Metabolic acidosis could have been related to renal failure.  Patient treated with insulin drip and now transitioned to Levemir 35 units twice daily with aspart insulin 5 units 3 times daily with meals, sliding scale insulin.  Seen by diabetes educator.  Blood glucose still elevated and in high 200s.  Was elevated up to 471 yesterday and Levemir dose adjusted. Will increase to 40 units BID today per recommendations. Discussed in detail with patient and bedside nurse regarding home regimen, glucose checks etc. Requested DE to f/u again and educate patient further regarding self administartion  2. Symptomatic bradycardia: Seen by cardiology.  Now off dopamine drip. Also off Coreg, amiodarone.  On Synthroid for hypothyroidism but TSH elevated.  Check free T3, T4 and adjust dosage if needed.  3.  Persistent atrial fibrillation/A flutter: Patient is s/p ablation in 2012.  He underwent last cardioversion in January 2020, on Pradaxa for chronic anticoagulation.  Not on AV blockers due to problem #2.  Previously on amiodarone/Coreg  4.  Acute kidney injury on CKD stage IIIb: Present on admission with creatinine at 3.24.  Baseline creatinine appears to be around 1.5-1.7 and GFR around 44-48.  Renal function now improved with IV hydration for problem #1 and creatinine close to baseline at 1.56 today.  Continue to monitor.  5.  Chronic systolic/diastolic heart failure: Due to  tachycardia mediated nonischemic cardiomyopathy.  Last stress test in October/2020 without ischemia.  Echo in this admission shows EF 40 to 45%.  Currently euvolemic.  6.  Hypertension: Previously on Coreg, hydralazine, Imdur.  Currently not on any antihypertensives and SBP fluctuating between systolic [id number].  Continue to monitor.  7.  Hypothyroidism:  Resumed on home dose of Synthroid 50 mcg, TSH significantly elevated at 33.3 in the setting of non compliance x 2 weeks.  No need increase in Synthroid dosage.   8.  Hyperlipidemia: On statins  9. Obstructive sleep apnea: On CPAP  10.  Alcohol abuse: On CIWA protocol, multivitamin/thiamine.  DVT prophylaxis: on Pradaxa Code Status: Full code Family / Patient Communication:  Disposition Plan:   Patient is from home prior to hospitalization. Received/Receiving inpatient care for Hyperglycemia/bradycardia  Discharge when medications optimized and outpatient therapy established.Likely in am.     LOS: 3 days    Time spent: 35 minutes    [place] Shi, MD [facility] Pager in [place]  If 7PM-7AM, please contact night-coverage [email or web address] 09/06/2019, 8:08 AM

## 2019-09-06 NOTE — Progress Notes (Signed)
Progress Note  Patient Name: [name] Date of Encounter: 09/06/2019  Primary Cardiologist: Buford Dresser, MD   Subjective   Denies any CP or SOB  Inpatient Medications    Scheduled Meds: . Chlorhexidine Gluconate Cloth  6 each Topical Q0600  . dabigatran  150 mg Oral BID  . folic acid  1 mg Oral Daily  . gabapentin  600 mg Oral BID  . insulin aspart  0-20 Units Subcutaneous TID WC  . insulin aspart  0-5 Units Subcutaneous QHS  . insulin aspart  5 Units Subcutaneous TID WC  . insulin detemir  35 Units Subcutaneous BID  . levothyroxine  50 mcg Oral QAC breakfast  . mouth rinse  15 mL Mouth Rinse BID  . mometasone-formoterol  2 puff Inhalation BID  . multivitamin with minerals  1 tablet Oral Daily  . rosuvastatin  10 mg Oral q1800  . sodium chloride flush  3 mL Intravenous Once  . thiamine  100 mg Oral Daily   Or  . thiamine  100 mg Intravenous Daily   Continuous Infusions:  PRN Meds: albuterol, dextrose, doxylamine (Sleep), LORazepam **OR** LORazepam, traMADol   Vital Signs    Vitals:   09/06/19 0548 09/06/19 0549 09/06/19 0736 09/06/19 0817  BP:   126/88   Pulse:   (!) 54   Resp:   14   Temp: 97.6 F (36.4 C) 97.6 F (36.4 C)    TempSrc: Axillary Oral    SpO2:   97% 97%  Weight: 135.6 kg     Height:        Intake/Output Summary (Last 24 hours) at 09/06/2019 0824 Last data filed at 09/06/2019 0741 Gross per 24 hour  Intake 1920 ml  Output 700 ml  Net 1220 ml   Last 3 Weights 09/06/2019 09/05/2019 09/04/2019  Weight (lbs) 298 lb 15.1 oz 299 lb 8 oz 302 lb 11.1 oz  Weight (kg) 135.6 kg 135.852 kg 137.3 kg      Telemetry    Sinus rhythm with baseline HR 50s, occasional PVCs, occasional HR down to 48 - Personally Reviewed  ECG    3/21 Sinus bradycardia, 1st degree AV block, LBBB - Personally Reviewed  Physical Exam   GEN: No acute distress.   Neck: No JVD Cardiac: RRR, no murmurs, rubs, or gallops.  Respiratory: Clear to  auscultation bilaterally. GI: Soft, nontender, non-distended  MS: No edema; No deformity. Neuro:  Nonfocal  Psych: Normal affect   Labs    High Sensitivity Troponin:   Recent Labs  Lab 09/03/19 0006 09/03/19 0329 09/03/19 1206  TROPONINIHS 61* 84* 82*      Chemistry Recent Labs  Lab 09/04/19 0755 09/04/19 1557 09/05/19 0036 09/05/19 0349 09/06/19 0330  NA 135   < > 130* 132* 134*  K 3.3*   < > 4.3 4.2 3.8  CL 101   < > 99 100 106  CO2 28   < > 23 24 21*  GLUCOSE 155*   < > 452* 390* 225*  BUN 28*   < > 29* 29* 22  CREATININE 1.69*   < > 2.01* 1.97* 1.56*  CALCIUM 8.9   < > 8.8* 8.7* 8.5*  PROT 6.5  --   --   --   --   ALBUMIN 3.2*  --   --   --   --   AST 28  --   --   --   --   ALT 22  --   --   --   --  ALKPHOS 66  --   --   --   --   BILITOT 1.0  --   --   --   --   GFRNONAA 42*   < > 34* 35* 46*  GFRAA 49*   < > 39* 40* 54*  ANIONGAP 6   < > 8 8 7    < > = values in this interval not displayed.     Hematology Recent Labs  Lab 09/03/19 0329 09/04/19 0755 09/06/19 0330  WBC 8.0 6.4 4.9  RBC 4.81 5.06 4.79  HGB 14.9 15.8 14.7  HCT 41.3 45.6 43.5  MCV 85.9 90.1 90.8  MCH 31.0 31.2 30.7  MCHC 36.1* 34.6 33.8  RDW 12.7 13.2 13.2  PLT 182 174 126*    BNPNo results for input(s): BNP, PROBNP in the last 168 hours.   DDimer No results for input(s): DDIMER in the last 168 hours.   Radiology    ECHOCARDIOGRAM COMPLETE  Result Date: 09/04/2019    ECHOCARDIOGRAM REPORT   Patient Name:   [name] Date of Exam: 09/04/2019 Medical Rec #:  [id number]       Height:       73.0 in Accession #:    [id number]      Weight:       302.7 lb Date of Birth:  Dec 13, 1955       BSA:          2.566 m Patient Age:    37 years        BP:           117/78 mmHg Patient Gender: M               HR:           53 bpm. Exam Location:  Inpatient Procedure: 2D Echo, Color Doppler, Cardiac Doppler and Intracardiac            Opacification Agent Indications:    R00.1 Bradycardia,  unspecified  History:        Patient has prior history of Echocardiogram examinations, most                 recent 03/29/2019. CHF, Arrythmias:Atrial Fibrillation and                 Atrial Flutter; Risk Factors:Hypertension, Diabetes,                 Dyslipidemia and Sleep Apnea. COVID+ on 01/31/19.  Sonographer:    Raquel Sarna Senior RDCS Referring Phys: [id number] Francesca Jewett  Sonographer Comments: Technically difficult due to patient body habitus. IMPRESSIONS  1. Left ventricular ejection fraction, by estimation, is 40 to 45%. The left ventricle has mildly decreased function. The left ventricle demonstrates global hypokinesis. There is severe left ventricular hypertrophy, asymmetric and septal predominant (parasternal views of the septum raise question of HOCM or infiltrative cardiomyopathy - cardiac MRI could be considered if clinically appropriate). Left ventricular diastolic parameters are consistent with Grade I diastolic dysfunction (impaired relaxation).  2. Right ventricular systolic function is normal. The right ventricular size is normal. Tricuspid regurgitation signal is inadequate for assessing PA pressure.  3. Left atrial size was mildly dilated.  4. The mitral valve is grossly normal. Trivial mitral valve regurgitation.  5. The aortic valve was not well visualized. Aortic valve regurgitation is not visualized.  6. Aortic dilatation noted. There is mild dilatation of the ascending aorta.  7. The inferior vena cava is normal in size with  greater than 50% respiratory variability, suggesting right atrial pressure of 3 mmHg. FINDINGS  Left Ventricle: Left ventricular ejection fraction, by estimation, is 40 to 45%. The left ventricle has mildly decreased function. The left ventricle demonstrates global hypokinesis. Definity contrast agent was given IV to delineate the left ventricular  endocardial borders. The left ventricular internal cavity size was normal in size. There is severe left ventricular  hypertrophy. Left ventricular diastolic parameters are consistent with Grade I diastolic dysfunction (impaired relaxation). Right Ventricle: The right ventricular size is normal. No increase in right ventricular wall thickness. Right ventricular systolic function is normal. Tricuspid regurgitation signal is inadequate for assessing PA pressure. Left Atrium: Left atrial size was mildly dilated. Right Atrium: Right atrial size was normal in size. Pericardium: There is no evidence of pericardial effusion. Mitral Valve: The mitral valve is grossly normal. Mild mitral annular calcification. Trivial mitral valve regurgitation. Tricuspid Valve: The tricuspid valve is grossly normal. Tricuspid valve regurgitation is trivial. Aortic Valve: The aortic valve was not well visualized. Aortic valve regurgitation is not visualized. Mild aortic valve annular calcification. Pulmonic Valve: The pulmonic valve was not well visualized. Pulmonic valve regurgitation is not visualized. Aorta: Aortic dilatation noted. There is mild dilatation of the ascending aorta. Venous: The inferior vena cava is normal in size with greater than 50% respiratory variability, suggesting right atrial pressure of 3 mmHg. IAS/Shunts: The interatrial septum appears to be lipomatous. No atrial level shunt detected by color flow Doppler.  LEFT VENTRICLE PLAX 2D LVIDd:         5.60 cm  Diastology LVIDs:         4.90 cm  LV e' lateral:   4.35 cm/s LV PW:         1.60 cm  LV E/e' lateral: 9.0 LV IVS:        2.10 cm  LV e' medial:    3.37 cm/s LVOT diam:     2.50 cm  LV E/e' medial:  11.6 LV SV:         63 LV SV Index:   25 LVOT Area:     4.91 cm  RIGHT VENTRICLE RV S prime:     14.60 cm/s TAPSE (M-mode): 2.3 cm LEFT ATRIUM             Index       RIGHT ATRIUM           Index LA diam:        5.30 cm 2.07 cm/m  RA Area:     22.30 cm LA Vol (A2C):   88.2 ml 34.37 ml/m RA Volume:   61.70 ml  24.05 ml/m LA Vol (A4C):   63.1 ml 24.59 ml/m LA Biplane Vol: 78.5 ml  30.59 ml/m  AORTIC VALVE LVOT Vmax:   67.20 cm/s LVOT Vmean:  45.400 cm/s LVOT VTI:    0.129 m  AORTA Ao Root diam: 3.60 cm Ao Asc diam:  4.20 cm MITRAL VALVE MV Area (PHT): 1.92 cm    SHUNTS MV Decel Time: 396 msec    Systemic VTI:  0.13 m MV E velocity: 39.00 cm/s  Systemic Diam: 2.50 cm MV A velocity: 52.30 cm/s MV E/A ratio:  0.75 Rozann Lesches MD Electronically signed by Rozann Lesches MD Signature Date/Time: 09/04/2019/11:20:12 AM    Final     Cardiac Studies   Myoview 04/08/2019  Nuclear stress EF: 49%.  The left ventricular ejection fraction is mildly decreased (45-54%).  No T wave inversion was noted  during stress.  There was no ST segment deviation noted during stress.  Defect 1: There is a small defect of mild severity.  This is an intermediate risk study.   Small size, mild intensity fixed apical perfusion defect, likely thinning. No significant reversible ischemia. Dilated LV with mild hypokinesis - LVEF 49%. This is an intermediate risk study due to decreased LV function.    Echo 09/04/2019 1. Left ventricular ejection fraction, by estimation, is 40 to 45%. The  left ventricle has mildly decreased function. The left ventricle  demonstrates global hypokinesis. There is severe left ventricular  hypertrophy, asymmetric and septal predominant  (parasternal views of the septum raise question of HOCM or infiltrative  cardiomyopathy - cardiac MRI could be considered if clinically  appropriate). Left ventricular diastolic parameters are consistent with  Grade I diastolic dysfunction (impaired  relaxation).  2. Right ventricular systolic function is normal. The right ventricular  size is normal. Tricuspid regurgitation signal is inadequate for assessing  PA pressure.  3. Left atrial size was mildly dilated.  4. The mitral valve is grossly normal. Trivial mitral valve  regurgitation.  5. The aortic valve was not well visualized. Aortic valve regurgitation  is not  visualized.  6. Aortic dilatation noted. There is mild dilatation of the ascending  aorta.  7. The inferior vena cava is normal in size with greater than 50%  respiratory variability, suggesting right atrial pressure of 3 mmHg.   Patient Profile     64 y.o. male with PMH of persistent atrial fibrillation, atrial flutter s/p ablation in 2012, HTN, tachy-mediated cardiomyopathy, DM II and OSA on CPAP who presented with weakness and blood sugar level of >1000, bradycardia and AKI  Assessment & Plan    1. Symptomatic bradycardia  - in the setting of hyperosmolar hyperglycemia   - arrived with HR in the 30s requiring dopamine  - coreg and amiodarone on hold.   - HR mainly in the 50s, very rarely HR dip down to 48-49. Plan to continue to hold coreg and amiodarone upon discharge. Dr. Harrell Gave to consider heart monitor on followup  2. H/o atrial fibrillation/atrial flutter: on pradaxa.   - last cardioversion in Jan 2020   3. Chronic systolic and diastolic heart failure due to NICM  - EF 40-45% on echo 09/04/2019, euvolemic on exam  - myoview on 04/08/2019 showed no significant ischemia.   4. AKI: in the setting of hyperosmolar hyperglycemia, improved. Cr 1.56 this morning, this is his baseline. Admission Cr 3.24   5. DM II: will defer to primary team, likely will need a endocrinologist after discharge.  - likely related to dietary indiscretion  6. Hypothyroidism: on synthroid  7. OSA on CPAP      For questions or updates, please contact [place] Please consult [email or web address] for contact info under        Signed, Almyra Deforest, [place]  09/06/2019, 8:24 AM

## 2019-09-07 DIAGNOSIS — J4520 Mild intermittent asthma, uncomplicated: Secondary | ICD-10-CM

## 2019-09-07 DIAGNOSIS — I480 Paroxysmal atrial fibrillation: Secondary | ICD-10-CM | POA: Diagnosis not present

## 2019-09-07 DIAGNOSIS — I5022 Chronic systolic (congestive) heart failure: Secondary | ICD-10-CM

## 2019-09-07 DIAGNOSIS — E1100 Type 2 diabetes mellitus with hyperosmolarity without nonketotic hyperglycemic-hyperosmolar coma (NKHHC): Secondary | ICD-10-CM

## 2019-09-07 DIAGNOSIS — R001 Bradycardia, unspecified: Secondary | ICD-10-CM | POA: Diagnosis not present

## 2019-09-07 DIAGNOSIS — I10 Essential (primary) hypertension: Secondary | ICD-10-CM

## 2019-09-07 LAB — CBC
HCT: 43.1 % (ref 39.0–52.0)
Hemoglobin: 14.4 g/dL (ref 13.0–17.0)
MCH: 30.7 pg (ref 26.0–34.0)
MCHC: 33.4 g/dL (ref 30.0–36.0)
MCV: 91.9 fL (ref 80.0–100.0)
Platelets: 133 10*3/uL — ABNORMAL LOW (ref 150–400)
RBC: 4.69 MIL/uL (ref 4.22–5.81)
RDW: 13.3 % (ref 11.5–15.5)
WBC: 4.9 10*3/uL (ref 4.0–10.5)
nRBC: 0 % (ref 0.0–0.2)

## 2019-09-07 LAB — BASIC METABOLIC PANEL
Anion gap: 8 (ref 5–15)
BUN: 16 mg/dL (ref 8–23)
CO2: 24 mmol/L (ref 22–32)
Calcium: 8.7 mg/dL — ABNORMAL LOW (ref 8.9–10.3)
Chloride: 106 mmol/L (ref 98–111)
Creatinine, Ser: 1.4 mg/dL — ABNORMAL HIGH (ref 0.61–1.24)
GFR calc Af Amer: >60 mL/min (ref >60)
GFR calc non Af Amer: 53 mL/min — ABNORMAL LOW (ref >60)
Glucose, Bld: 216 mg/dL — ABNORMAL HIGH (ref 70–99)
Potassium: 4 mmol/L (ref 3.5–5.1)
Sodium: 138 mmol/L (ref 135–145)

## 2019-09-07 LAB — GLUCOSE, CAPILLARY
Glucose-Capillary: 169 mg/dL — ABNORMAL HIGH (ref 70–99)
Glucose-Capillary: 288 mg/dL — ABNORMAL HIGH (ref 70–99)

## 2019-09-07 LAB — MAGNESIUM: Magnesium: 2.1 mg/dL (ref 1.7–2.4)

## 2019-09-07 LAB — T3, FREE: T3, Free: 2.1 pg/mL (ref 2.0–4.4)

## 2019-09-07 MED ORDER — NOVOLOG FLEXPEN 100 UNIT/ML ~~LOC~~ SOPN: PEN_INJECTOR | SUBCUTANEOUS | 11 refills | Status: DC

## 2019-09-07 MED ORDER — FREESTYLE LIBRE 14 DAY SENSOR MISC: 1 refills | Status: DC

## 2019-09-07 MED ORDER — LOSARTAN POTASSIUM 25 MG PO TABS: 12.5000 mg | ORAL_TABLET | Freq: Every morning | ORAL | 1 refills | Status: DC

## 2019-09-07 MED ORDER — THIAMINE HCL 100 MG PO TABS: 100.0000 mg | ORAL_TABLET | Freq: Every day | ORAL | 1 refills | Status: DC

## 2019-09-07 MED ORDER — INSULIN DETEMIR 100 UNIT/ML FLEXPEN: 40.0000 [IU] | PEN_INJECTOR | Freq: Two times a day (BID) | SUBCUTANEOUS | 11 refills | Status: DC

## 2019-09-07 MED ORDER — LEVOTHYROXINE SODIUM 50 MCG PO TABS: 50.0000 ug | ORAL_TABLET | Freq: Every day | ORAL | 1 refills | Status: DC

## 2019-09-07 MED ORDER — FUROSEMIDE 40 MG PO TABS: 40.0000 mg | ORAL_TABLET | Freq: Every day | ORAL | 5 refills | Status: DC | PRN

## 2019-09-07 MED ORDER — FOLIC ACID 1 MG PO TABS: 1.0000 mg | ORAL_TABLET | Freq: Every day | ORAL | 1 refills | Status: DC

## 2019-09-07 MED ORDER — INSULIN ASPART 100 UNIT/ML FLEXPEN: 5.0000 [IU] | PEN_INJECTOR | Freq: Three times a day (TID) | SUBCUTANEOUS | 11 refills | Status: DC

## 2019-09-07 MED ORDER — PEN NEEDLES 32G X 5 MM MISC: 1.0000 "application " | Freq: Three times a day (TID) | 4 refills | Status: DC

## 2019-09-07 NOTE — Care Management (Signed)
09-07-19 Case Manager received consult for home health services. Case Manager spoke with patient regarding home health services and patient is not homebound. Patient states he rides his motorcycle during the day. Patient states he feels that he will be ok and not need a home health nurse. He takes his medications without any problems. Patient has a glucose meter in th home and will need a new lancing device. Patient states he can pick lancet up at pharmacy. Case Manager discussed diet with patient along with scheduling an appointment with an endocrinologist. No home health set up at this time. Patient is aware to contact his primary care provider if he has needs in the future. No further needs from Case Manager. Bethena Roys, RN,BSN Case Manager

## 2019-09-07 NOTE — Discharge Summary (Addendum)
Physician Discharge Summary  MAKAR VONGPHAKDY [id number] DOB: 14-Apr-1956 DOA: 09/02/2019  PCP: [name], MD  Admit date: 09/02/2019 Discharge date: 09/07/2019 Consultations: Diabetes educator Admitted From: home Disposition: home  Discharge Diagnoses:  Principal Problem:   Symptomatic bradycardia Active Problems:   Hyperosmolar hyperglycemic state (HHS) ([place])   Hypothyroidism   Hyperlipidemia associated with type 2 diabetes mellitus ([place])   MORBID OBESITY   Essential hypertension   ATRIAL FIBRILLATION   Chronic systolic heart failure (HCC)   Asthma   Sleep apnea   Type 2 diabetes mellitus with hyperlipidemia (HCC)   Dilated cardiomyopathy [facility])   Hospital Course Summary: 64 year old male with a past medical history significant of atrial fibrillation/flutter status post ablation and multiple cardioversions on Pradaxa, HFrEF EF of 35 to 40%, asthma, type 2 diabetes, hyperlipidemia, hypertension and OSA who woke up feeling lightheaded with weakness in both legs. He "fell" after his legs gave out and he sat down in a chair, did not hit his head. He noted some transient left hand tingling, no focal extremity weakness, along with intermittent blurred vision. He is on glipizide at home, does not usually check his blood sugar.He follows with cardiology and had a nuclear stress test in 03/2019 without clear evidence of blockage. He reported taking amiodarone, carvedilol, hydralazine, and Imdur as directed and reports no recent med changes. ED Course: Afebrile,glucose was greater than 1000, CO2 20 gap of 17 and pH 7.2, creatinine of 3.2 (baseline 1.4-1.7). He was also bradycardic with heart rate 30s-40s and junctional rhythm without hypotension. Sodium 113 (Corrected sodium is 130) Potassium 6.1 and he was given 2 g of calcium gluconate and started on an insulin gtt.  Hospital course: Patient admitted to [facility] service on 3/20 with cardiology consultation for symptomatic  bradycardia. Started on dopamine drip and heart rate is better now. Now Off dopamine. He was also found to be hyperglycemic with hyperosmolar state on presentation and started on insulin drip subsequently transition to SQ insulin. TSH elevated, States he could not get through to his PCP office for synthroid refill and wasn't taking for 2 weeks. Transferred to medical floor/TRH service on 3/23.  1.  Hyperosmolar hyperglycemia, nonketotic state: Patient had blood glucose > 1000 on presentation with bicarb of 20 and anion gap 17 but urine ketones negative, beta hydroxybutyrate level not elevated.  DKA ruled out.  Metabolic acidosis could have been related to renal failure.  Patient treated with insulin drip and now transitioned to Levemir 35 units twice daily with aspart insulin 5 units 3 times daily with meals, sliding scale insulin.  Seen by diabetes educator.  Blood glucose remained elevated in high 200s at the time of transfer out of ICU and Levemir dose adjusted. Seen by Diabetes educator during the hospital course, recommended discharge on Levemir 40 units BID and premeal SA insulin 5 units TID along with SSI. Discussed in detail with patient and bedside nurse regarding home regimen, glucose checks etc. Requested DE to f/u and continue to educate patient upon discharge. Requested [facility] RN if possible for weekly visits as patient very anxious about insulin use.  2. Symptomatic bradycardia: Seen by cardiology.  Now off dopamine drip. Also discontinued Coreg as well as amiodarone.  On Synthroid for hypothyroidism but TSH elevated in the setting of not taking meds that could have contributed. F/U cardiology clinic on 3/31 , possible Holter at that time.  3.  Persistent atrial fibrillation/A flutter: Patient is s/p ablation in 2012.  He underwent last cardioversion  in January 2020, on Pradaxa for chronic anticoagulation.  Not on AV blockers due to problem #2.  Previously on amiodarone/Coreg. Now in SR but  patient worried about going back into A fib.Holter on f/u cardiology visit  4.  Acute kidney injury on CKD stage IIIb: Present on admission with creatinine at 3.24.  Baseline creatinine appears to be around 1.5-1.7 and GFR around 44-48.  Renal function now improved with IV hydration for problem #1 and creatinine back to baseline at 1.4 today. He has been off lasix (takes 40mg  BID at home) , Aldactone and ACEI (Llosartan 100mg ) while here.  5.  Chronic systolic/diastolic heart failure: Due to tachycardia mediated nonischemic cardiomyopathy.  Last stress test in October/2020 without ischemia.  Echo in this admission shows EF 40 to 45%.  Currently euvolemic. Meds held as discussed above in concern for AKI. D/W Dr Harrell Gave, will resume low dose aldactone/losarta upon discharge and advise to take Lasix only as needed for edema/dyspnea.   6.  Hypertension: Previously on Coreg, hydralazine, Imdur.  Currently not on any antihypertensives and SBP fluctuating between systolic [id number].  Resume aldactone/low dose losartan on d/c. Need potasium check on f/u labs  7.  Hypothyroidism:  Resumed on home dose of Synthroid 50 mcg, TSH significantly elevated at 33.3 in the setting of non compliance x 2 weeks.  No need increase in Synthroid dosage. Issues new scripts  8.  Hyperlipidemia: On statins  9. Obstructive sleep apnea: On CPAP  10.  Alcohol abuse: Monitored On CIWA protocol, multivitamin/thiamine here but no signs of withdrawal.    Discharge Exam:  Vitals:   09/07/19 0826 09/07/19 0840  BP:  134/88  Pulse:    Resp:  17  Temp:    SpO2: 98%    Vitals:   09/07/19 0020 09/07/19 0510 09/07/19 0826 09/07/19 0840  BP: 126/80 122/72  134/88  Pulse:  (!) 54    Resp: 14 18  17   Temp: 97.8 F (36.6 C) 98.6 F (37 C)    TempSrc: Oral Oral    SpO2: 97% 100% 98%   Weight:  132 kg    Height:        General: Pt is alert, awake, not in acute distress Cardiovascular: RRR, S1/S2 +, no rubs, no  gallops Respiratory: CTA bilaterally, no wheezing, no rhonchi Abdominal: Soft, NT, ND, bowel sounds + Extremities: no edema, no cyanosis  Discharge Condition:Stable CODE STATUS: Full code Diet recommendation: heart healthy, carb modified Recommendations for Outpatient Follow-up:  1. Follow up with PCP: 5-7 days 2. Follow up with consultants: Cardiology on 3/31 3. Please obtain follow up labs including: BMP, TSH, BG log review per home diary  Home Health services upon discharge: RN requested for medication held Equipment/Devices upon discharge: Diabetic supplies   Discharge Instructions:  Discharge Instructions    ([place]) Call MD:  Anytime you have any of the following symptoms: 1) 3 pound weight gain in 24 hours or 5 pounds in 1 week 2) shortness of breath, with or without a dry hacking cough 3) swelling in the hands, feet or stomach 4) if you have to sleep on extra pillows at night in order to breathe.   Complete by: As directed    Ambulatory referral to Nutrition and Diabetic Education   Complete by: As directed    Call MD for:  difficulty breathing, headache or visual disturbances   Complete by: As directed    Call MD for:  extreme fatigue   Complete by:  As directed    Call MD for:  persistant dizziness or light-headedness   Complete by: As directed    Call MD for:  temperature >100.4   Complete by: As directed    Diet - low sodium heart healthy   Complete by: As directed    Diet Carb Modified   Complete by: As directed    Increase activity slowly   Complete by: As directed      Allergies as of 09/07/2019   No Known Allergies     Medication List    STOP taking these medications   amiodarone 200 MG tablet Commonly known as: PACERONE   carvedilol 3.125 MG tablet Commonly known as: COREG   carvedilol 6.25 MG tablet Commonly known as: COREG   glipiZIDE 5 MG tablet Commonly known as: GLUCOTROL   hydrALAZINE 50 MG tablet Commonly known as:  APRESOLINE   isosorbide mononitrate 30 MG 24 hr tablet Commonly known as: IMDUR     TAKE these medications   albuterol 108 (90 Base) MCG/ACT inhaler Commonly known as: VENTOLIN HFA Inhale 2 puffs into the lungs every 6 (six) hours as needed for wheezing or shortness of breath.   doxylamine (Sleep) 25 MG tablet Commonly known as: UNISOM Take 50 mg by mouth at bedtime as needed for sleep.   folic acid 1 MG tablet Commonly known as: FOLVITE Take 1 tablet (1 mg total) by mouth daily. Start taking on: September 08, 2019   FreeStyle Libre 14 Day Sensor Misc Use as directed by Diabetes educator   furosemide 40 MG tablet Commonly known as: LASIX Take 1 tablet (40 mg total) by mouth daily as needed for fluid or edema (leg swellings/shortness of breath /weight gain >5 punds in 48 hrs). What changed:   when to take this  reasons to take this   gabapentin 300 MG capsule Commonly known as: NEURONTIN TAKE 1 CAPSULE BY MOUTH THREE TIMES A DAY What changed: See the new instructions.   insulin aspart 100 UNIT/ML FlexPen Commonly known as: NOVOLOG Inject 5 Units into the skin 3 (three) times daily with meals.   NovoLOG FlexPen 100 UNIT/ML FlexPen Generic drug: insulin aspart Per sliding scale as needed four times daily BG 70-120: 0 Units, BG 121-150:3 units, BG 151-200:4 Units, BG 201-250:7 units BG 251-300: 11 Units, 301-350 : 15 units. If BG >350: Call your primary care doctor   insulin detemir 100 UNIT/ML FlexPen Commonly known as: LEVEMIR Inject 40 Units into the skin 2 (two) times daily.   levothyroxine 50 MCG tablet Commonly known as: SYNTHROID Take 1 tablet (50 mcg total) by mouth daily before breakfast.   losartan 25 MG tablet Commonly known as: COZAAR Take 0.5 tablets (12.5 mg total) by mouth every morning. What changed:   medication strength  how much to take   mometasone 50 MCG/ACT nasal spray Commonly known as: NASONEX Place 2 sprays into the nose daily as  needed (allergies or rhinitis).   Pen Needles 32G X 5 MM Misc 1 application by Does not apply route 4 (four) times daily -  with meals and at bedtime.   Pradaxa 150 MG Caps capsule Generic drug: dabigatran TAKE 1 CAPSULE BY MOUTH TWICE A DAY What changed: how much to take   rosuvastatin 10 MG tablet Commonly known as: CRESTOR TAKE 1 TABLET BY MOUTH EVERY DAY What changed: when to take this   spironolactone 25 MG tablet Commonly known as: ALDACTONE Take 25 mg by mouth in the morning.  Symbicort 80-4.5 MCG/ACT inhaler Generic drug: budesonide-formoterol USE 2 INHALATIONS TWICE A DAY What changed: See the new instructions.   thiamine 100 MG tablet Take 1 tablet (100 mg total) by mouth daily. Start taking on: September 08, 2019   traMADol 50 MG tablet Commonly known as: ULTRAM Take 50 mg by mouth every 6 (six) hours as needed (for pain).   vitamin C 500 MG tablet Commonly known as: ASCORBIC ACID Take 500-1,000 mg by mouth daily.       No Known Allergies    The results of significant diagnostics from this hospitalization (including imaging, microbiology, ancillary and laboratory) are listed below for reference.    Labs: BNP (last 3 results) Recent Labs    02/03/19 0400 02/04/19 0428 02/05/19 0228  BNP 324.6* 428.6* 0000000*   Basic Metabolic Panel: Recent Labs  Lab 09/03/19 0329 09/03/19 0807 09/04/19 0755 09/04/19 1557 09/04/19 2040 09/05/19 0036 09/05/19 0349 09/06/19 0330 09/07/19 0419  NA 124*   < > 135   < > 128* 130* 132* 134* 138  K 3.9   < > 3.3*   < > 4.2 4.3 4.2 3.8 4.0  CL 88*   < > 101   < > 98 99 100 106 106  CO2 23   < > 28   < > 22 23 24  21* 24  GLUCOSE 551*   < > 155*   < > 432* 452* 390* 225* 216*  BUN 54*   < > 28*   < > 29* 29* 29* 22 16  CREATININE 2.71*   < > 1.69*   < > 1.97* 2.01* 1.97* 1.56* 1.40*  CALCIUM 9.4   < > 8.9   < > 8.6* 8.8* 8.7* 8.5* 8.7*  MG 2.4  --  2.3  --   --   --   --  2.1 2.1  PHOS 4.1  --  3.1  --   --   --    --   --   --    < > = values in this interval not displayed.   Liver Function Tests: Recent Labs  Lab 09/04/19 0755  AST 28  ALT 22  ALKPHOS 66  BILITOT 1.0  PROT 6.5  ALBUMIN 3.2*   No results for input(s): LIPASE, AMYLASE in the last 168 hours. No results for input(s): AMMONIA in the last 168 hours. CBC: Recent Labs  Lab 09/02/19 2142 09/02/19 2326 09/02/19 2332 09/03/19 0329 09/04/19 0755 09/06/19 0330 09/07/19 0419  WBC 7.0  --   --  8.0 6.4 4.9 4.9  HGB 15.1   < > 14.3 14.9 15.8 14.7 14.4  HCT 44.2   < > 42.0 41.3 45.6 43.5 43.1  MCV 91.7  --   --  85.9 90.1 90.8 91.9  PLT 196  --   --  182 174 126* 133*   < > = values in this interval not displayed.   Cardiac Enzymes: No results for input(s): CKTOTAL, CKMB, CKMBINDEX, TROPONINI in the last 168 hours. BNP: Invalid input(s): POCBNP CBG: Recent Labs  Lab 09/06/19 0727 09/06/19 1159 09/06/19 1614 09/06/19 2059 09/07/19 0758  GLUCAP 259* 262* 283* 280* 169*   D-Dimer No results for input(s): DDIMER in the last 72 hours. Hgb A1c No results for input(s): HGBA1C in the last 72 hours. Lipid Profile No results for input(s): CHOL, HDL, LDLCALC, TRIG, CHOLHDL, LDLDIRECT in the last 72 hours. Thyroid function studies Recent Labs    09/06/19 1023  T3FREE 2.1  Anemia work up No results for input(s): VITAMINB12, FOLATE, FERRITIN, TIBC, IRON, RETICCTPCT in the last 72 hours. Urinalysis    Component Value Date/Time   COLORURINE YELLOW 09/02/2019 2140   APPEARANCEUR CLEAR 09/02/2019 2140   LABSPEC 1.023 09/02/2019 2140   PHURINE 5.0 09/02/2019 2140   GLUCOSEU >=500 (A) 09/02/2019 2140   GLUCOSEU NEG mg/dL 05/18/2010 0124   HGBUR MODERATE (A) 09/02/2019 2140   BILIRUBINUR NEGATIVE 09/02/2019 2140   KETONESUR NEGATIVE 09/02/2019 2140   PROTEINUR NEGATIVE 09/02/2019 2140   UROBILINOGEN 0.2 05/18/2010 0124   NITRITE NEGATIVE 09/02/2019 2140   LEUKOCYTESUR NEGATIVE 09/02/2019 2140   Sepsis Labs Invalid  input(s): PROCALCITONIN,  WBC,  LACTICIDVEN Microbiology Recent Results (from the past 240 hour(s))  SARS CORONAVIRUS 2 (TAT 6-24 HRS)     Status: None   Collection Time: 09/02/19 11:18 PM  Result Value Ref Range Status   SARS Coronavirus 2 NEGATIVE NEGATIVE Final    Comment: (NOTE) SARS-CoV-2 target nucleic acids are NOT DETECTED. The SARS-CoV-2 RNA is generally detectable in upper and lower respiratory specimens during the acute phase of infection. Negative results do not preclude SARS-CoV-2 infection, do not rule out co-infections with other pathogens, and should not be used as the sole basis for treatment or other patient management decisions. Negative results must be combined with clinical observations, patient history, and epidemiological information. The expected result is Negative. Fact Sheet for Patients: SugarRoll.be Fact Sheet for Healthcare Providers: https://www.woods-mathews.com/ This test is not yet approved or cleared by the [place] FDA and  has been authorized for detection and/or diagnosis of SARS-CoV-2 by FDA under an Emergency Use Authorization (EUA). This EUA will remain  in effect (meaning this test can be used) for the duration of the COVID-19 declaration under Section 56 4(b)(1) of the Act, 21 U.S.C. section 360bbb-3(b)(1), unless the authorization is terminated or revoked sooner. Performed at [facility], [place] [address], [place], [place] [address]   Respiratory Panel by RT PCR (Flu A&B, Covid) - Nasopharyngeal Swab     Status: None   Collection Time: 09/03/19  2:32 AM   Specimen: Nasopharyngeal Swab  Result Value Ref Range Status   SARS Coronavirus 2 by RT PCR NEGATIVE NEGATIVE Final    Comment: (NOTE) SARS-CoV-2 target nucleic acids are NOT DETECTED. The SARS-CoV-2 RNA is generally detectable in upper respiratoy specimens during the acute phase of infection. The lowest concentration of SARS-CoV-2  viral copies this assay can detect is 131 copies/mL. A negative result does not preclude SARS-Cov-2 infection and should not be used as the sole basis for treatment or other patient management decisions. A negative result may occur with  improper specimen collection/handling, submission of specimen other than nasopharyngeal swab, presence of viral mutation(s) within the areas targeted by this assay, and inadequate number of viral copies (<131 copies/mL). A negative result must be combined with clinical observations, patient history, and epidemiological information. The expected result is Negative. Fact Sheet for Patients:  PinkCheek.be Fact Sheet for Healthcare Providers:  GravelBags.it This test is not yet ap proved or cleared by the [place] FDA and  has been authorized for detection and/or diagnosis of SARS-CoV-2 by FDA under an Emergency Use Authorization (EUA). This EUA will remain  in effect (meaning this test can be used) for the duration of the COVID-19 declaration under Section 564(b)(1) of the Act, 21 U.S.C. section 360bbb-3(b)(1), unless the authorization is terminated or revoked sooner.    Influenza A by PCR NEGATIVE NEGATIVE Final  Influenza B by PCR NEGATIVE NEGATIVE Final    Comment: (NOTE) The Xpert Xpress SARS-CoV-2/FLU/RSV assay is intended as an aid in  the diagnosis of influenza from Nasopharyngeal swab specimens and  should not be used as a sole basis for treatment. Nasal washings and  aspirates are unacceptable for Xpert Xpress SARS-CoV-2/FLU/RSV  testing. Fact Sheet for Patients: PinkCheek.be Fact Sheet for Healthcare Providers: GravelBags.it This test is not yet approved or cleared by the [place] FDA and  has been authorized for detection and/or diagnosis of SARS-CoV-2 by  FDA under an Emergency Use Authorization (EUA). This EUA will  remain  in effect (meaning this test can be used) for the duration of the  Covid-19 declaration under Section 564(b)(1) of the Act, 21  U.S.C. section 360bbb-3(b)(1), unless the authorization is  terminated or revoked. Performed at [facility], [place] [address], [place], [place] [address]   MRSA PCR Screening     Status: None   Collection Time: 09/03/19  5:29 AM   Specimen: Nasal Mucosa; Nasopharyngeal  Result Value Ref Range Status   MRSA by PCR NEGATIVE NEGATIVE Final    Comment:        The GeneXpert MRSA Assay (FDA approved for NASAL specimens only), is one component of a comprehensive MRSA colonization surveillance program. It is not intended to diagnose MRSA infection nor to guide or monitor treatment for MRSA infections. Performed at [facility], [place] [address], [place], [place] [address]     Procedures/Studies: [place] CHEST PORT 1 VIEW  Result Date: 09/03/2019 CLINICAL DATA:  Diabetic ketoacidosis EXAM: PORTABLE CHEST 1 VIEW COMPARISON:  February 22, 2019 FINDINGS: The heart size and mediastinal contours are within normal limits. Both lungs are clear. The visualized skeletal structures are unremarkable. IMPRESSION: No active disease. Electronically Signed   By: Dorise Bullion III M.D   On: 09/03/2019 08:32   ECHOCARDIOGRAM COMPLETE  Result Date: 09/04/2019    ECHOCARDIOGRAM REPORT   Patient Name:   [name] Date of Exam: 09/04/2019 Medical Rec #:  [name]       Height:       73.0 in Accession #:    [id number]      Weight:       302.7 lb Date of Birth:  1955-07-02       BSA:          2.566 m Patient Age:    23 years        BP:           117/78 mmHg Patient Gender: M               HR:           53 bpm. Exam Location:  Inpatient Procedure: 2D Echo, Color Doppler, Cardiac Doppler and Intracardiac            Opacification Agent Indications:    R00.1 Bradycardia, unspecified  History:        Patient has prior history of Echocardiogram examinations, most                  recent 03/29/2019. CHF, Arrythmias:Atrial Fibrillation and                 Atrial Flutter; Risk Factors:Hypertension, Diabetes,                 Dyslipidemia and Sleep Apnea. COVID+ on 01/31/19.  Sonographer:    Raquel Sarna Senior RDCS Referring Phys: [id number] Otilio Carpen  GLEAS[place]  Sonographer Comments: Technically difficult due to patient body habitus. IMPRESSIONS  1. Left ventricular ejection fraction, by estimation, is 40 to 45%. The left ventricle has mildly decreased function. The left ventricle demonstrates global hypokinesis. There is severe left ventricular hypertrophy, asymmetric and septal predominant (parasternal views of the septum raise question of HOCM or infiltrative cardiomyopathy - cardiac MRI could be considered if clinically appropriate). Left ventricular diastolic parameters are consistent with Grade I diastolic dysfunction (impaired relaxation).  2. Right ventricular systolic function is normal. The right ventricular size is normal. Tricuspid regurgitation signal is inadequate for assessing PA pressure.  3. Left atrial size was mildly dilated.  4. The mitral valve is grossly normal. Trivial mitral valve regurgitation.  5. The aortic valve was not well visualized. Aortic valve regurgitation is not visualized.  6. Aortic dilatation noted. There is mild dilatation of the ascending aorta.  7. The inferior vena cava is normal in size with greater than 50% respiratory variability, suggesting right atrial pressure of 3 mmHg. FINDINGS  Left Ventricle: Left ventricular ejection fraction, by estimation, is 40 to 45%. The left ventricle has mildly decreased function. The left ventricle demonstrates global hypokinesis. Definity contrast agent was given IV to delineate the left ventricular  endocardial borders. The left ventricular internal cavity size was normal in size. There is severe left ventricular hypertrophy. Left ventricular diastolic parameters are consistent with Grade I diastolic dysfunction (impaired  relaxation). Right Ventricle: The right ventricular size is normal. No increase in right ventricular wall thickness. Right ventricular systolic function is normal. Tricuspid regurgitation signal is inadequate for assessing PA pressure. Left Atrium: Left atrial size was mildly dilated. Right Atrium: Right atrial size was normal in size. Pericardium: There is no evidence of pericardial effusion. Mitral Valve: The mitral valve is grossly normal. Mild mitral annular calcification. Trivial mitral valve regurgitation. Tricuspid Valve: The tricuspid valve is grossly normal. Tricuspid valve regurgitation is trivial. Aortic Valve: The aortic valve was not well visualized. Aortic valve regurgitation is not visualized. Mild aortic valve annular calcification. Pulmonic Valve: The pulmonic valve was not well visualized. Pulmonic valve regurgitation is not visualized. Aorta: Aortic dilatation noted. There is mild dilatation of the ascending aorta. Venous: The inferior vena cava is normal in size with greater than 50% respiratory variability, suggesting right atrial pressure of 3 mmHg. IAS/Shunts: The interatrial septum appears to be lipomatous. No atrial level shunt detected by color flow Doppler.  LEFT VENTRICLE PLAX 2D LVIDd:         5.60 cm  Diastology LVIDs:         4.90 cm  LV e' lateral:   4.35 cm/s LV PW:         1.60 cm  LV E/e' lateral: 9.0 LV IVS:        2.10 cm  LV e' medial:    3.37 cm/s LVOT diam:     2.50 cm  LV E/e' medial:  11.6 LV SV:         63 LV SV Index:   25 LVOT Area:     4.91 cm  RIGHT VENTRICLE RV S prime:     14.60 cm/s TAPSE (M-mode): 2.3 cm LEFT ATRIUM             Index       RIGHT ATRIUM           Index LA diam:        5.30 cm 2.07 cm/m  RA Area:     22.30  cm LA Vol (A2C):   88.2 ml 34.37 ml/m RA Volume:   61.70 ml  24.05 ml/m LA Vol (A4C):   63.1 ml 24.59 ml/m LA Biplane Vol: 78.5 ml 30.59 ml/m  AORTIC VALVE LVOT Vmax:   67.20 cm/s LVOT Vmean:  45.400 cm/s LVOT VTI:    0.129 m  AORTA Ao Root  diam: 3.60 cm Ao Asc diam:  4.20 cm MITRAL VALVE MV Area (PHT): 1.92 cm    SHUNTS MV Decel Time: 396 msec    Systemic VTI:  0.13 m MV E velocity: 39.00 cm/s  Systemic Diam: 2.50 cm MV A velocity: 52.30 cm/s MV E/A ratio:  0.75 Rozann Lesches MD Electronically signed by Rozann Lesches MD Signature Date/Time: 09/04/2019/11:20:12 AM    Final     Time coordinating discharge: Over 30 minutes  SIGNED:   Guilford Shi, MD  [facility] 09/07/2019, 10:58 AM

## 2019-09-08 ENCOUNTER — Telehealth: Payer: Self-pay | Admitting: *Deleted

## 2019-09-08 DIAGNOSIS — G4730 Sleep apnea, unspecified: Secondary | ICD-10-CM | POA: Diagnosis not present

## 2019-09-08 DIAGNOSIS — G4733 Obstructive sleep apnea (adult) (pediatric): Secondary | ICD-10-CM | POA: Diagnosis not present

## 2019-09-08 NOTE — Telephone Encounter (Signed)
A message was left, re: schedule change

## 2019-09-14 ENCOUNTER — Ambulatory Visit: Payer: BC Managed Care – PPO | Admitting: Cardiology

## 2019-09-26 ENCOUNTER — Encounter: Payer: Self-pay | Admitting: Internal Medicine

## 2019-09-26 ENCOUNTER — Ambulatory Visit (INDEPENDENT_AMBULATORY_CARE_PROVIDER_SITE_OTHER): Payer: BC Managed Care – PPO | Admitting: Internal Medicine

## 2019-09-26 ENCOUNTER — Other Ambulatory Visit: Payer: Self-pay

## 2019-09-26 VITALS — BP 160/92 | HR 78 | Temp 98.1°F | Ht 73.0 in | Wt 297.0 lb

## 2019-09-26 DIAGNOSIS — E1169 Type 2 diabetes mellitus with other specified complication: Secondary | ICD-10-CM | POA: Diagnosis not present

## 2019-09-26 DIAGNOSIS — J4520 Mild intermittent asthma, uncomplicated: Secondary | ICD-10-CM | POA: Diagnosis not present

## 2019-09-26 DIAGNOSIS — E785 Hyperlipidemia, unspecified: Secondary | ICD-10-CM | POA: Diagnosis not present

## 2019-09-26 DIAGNOSIS — I10 Essential (primary) hypertension: Secondary | ICD-10-CM

## 2019-09-26 LAB — COMPREHENSIVE METABOLIC PANEL
ALT: 18 U/L (ref 0–53)
AST: 23 U/L (ref 0–37)
Albumin: 4 g/dL (ref 3.5–5.2)
Alkaline Phosphatase: 67 U/L (ref 39–117)
BUN: 16 mg/dL (ref 6–23)
CO2: 30 mEq/L (ref 19–32)
Calcium: 9.1 mg/dL (ref 8.4–10.5)
Chloride: 101 mEq/L (ref 96–112)
Creatinine, Ser: 1.48 mg/dL (ref 0.40–1.50)
GFR: 47.74 mL/min — ABNORMAL LOW (ref >60.00)
Glucose, Bld: 93 mg/dL (ref 70–99)
Potassium: 3.9 mEq/L (ref 3.5–5.1)
Sodium: 138 mEq/L (ref 135–145)
Total Bilirubin: 0.9 mg/dL (ref 0.2–1.2)
Total Protein: 7.3 g/dL (ref 6.0–8.3)

## 2019-09-26 LAB — CBC
HCT: 43.7 % (ref 39.0–52.0)
Hemoglobin: 14.8 g/dL (ref 13.0–17.0)
MCHC: 33.8 g/dL (ref 30.0–36.0)
MCV: 93.4 fl (ref 78.0–100.0)
Platelets: 191 10*3/uL (ref 150.0–400.0)
RBC: 4.68 Mil/uL (ref 4.22–5.81)
RDW: 14 % (ref 11.5–15.5)
WBC: 7.4 10*3/uL (ref 4.0–10.5)

## 2019-09-26 LAB — MAGNESIUM: Magnesium: 1.9 mg/dL (ref 1.5–2.5)

## 2019-09-26 MED ORDER — FREESTYLE LIBRE 14 DAY SENSOR MISC: 11 refills | Status: DC

## 2019-09-26 NOTE — Progress Notes (Signed)
   Subjective:   Patient ID: [name], male    DOB: 05-10-1956, 64 y.o.   MRN: [id number]  HPI The patient is a 64 YO man coming in for hospital follow up (in for glucose >1000 and HHS, treated with insulin drip and then transitioned to insulin regimen, HgA1c was very high and he recovered well, is taking insulin and checking sugars, went home with freestyle libre system but no refills for the sensor). He has been checking sugars with meter since being home and taking his insulin. Usually morning sugars are good, sometimes lunch around 200 but down to 150s before dinner. Using sliding scale. Would like to see an endocrine specialist. Weight stable. Denies chest pains or SOB. Denies nausea/vomiting. Denies diarrhea/constipation. Denies confusion or mental fog.   PMH, [facility], social history reviewed and updated.   Review of Systems  Constitutional: Negative.   HENT: Negative.   Eyes: Negative.   Respiratory: Negative for cough, chest tightness and shortness of breath.   Cardiovascular: Negative for chest pain, palpitations and leg swelling.  Gastrointestinal: Negative for abdominal distention, abdominal pain, constipation, diarrhea, nausea and vomiting.  Musculoskeletal: Negative.   Skin: Negative.   Neurological: Negative.   Psychiatric/Behavioral: Negative.     Objective:  Physical Exam Constitutional:      Appearance: He is well-developed. He is obese.  HENT:     Head: Normocephalic and atraumatic.  Cardiovascular:     Rate and Rhythm: Normal rate and regular rhythm.  Pulmonary:     Effort: Pulmonary effort is normal. No respiratory distress.     Breath sounds: Normal breath sounds. No wheezing or rales.  Abdominal:     General: Bowel sounds are normal. There is no distension.     Palpations: Abdomen is soft.     Tenderness: There is no abdominal tenderness. There is no rebound.  Musculoskeletal:     Cervical back: Normal range of motion.  Skin:    General: Skin is warm  and dry.  Neurological:     Mental Status: He is alert and oriented to person, place, and time.     Coordination: Coordination normal.     Vitals:   09/26/19 1329  BP: (!) 160/92  Pulse: 78  Temp: 98.1 F (36.7 C)  SpO2: 100%  Weight: 297 lb (134.7 kg)  Height: 6\' 1"  (1.854 m)    This visit occurred during the SARS-CoV-2 public health emergency.  Safety protocols were in place, including screening questions prior to the visit, additional usage of staff PPE, and extensive cleaning of exam room while observing appropriate contact time as indicated for disinfecting solutions.   Assessment & Plan:

## 2019-09-26 NOTE — Patient Instructions (Addendum)
We will get you in with a diabetes specialist and check the labs today.  We have sent in the refill of the freestyle libre.

## 2019-09-28 NOTE — Progress Notes (Signed)
[name], [name] ([id number]) Visit Report for 08/12/2019 Arrival Information Details Patient Name: Date of Service: [name], [name] 08/12/2019 2:15 PM Medical Record [id number] Patient Account Number: [id number] Date of Birth/Sex: Treating RN: [birth year] (64 y.o. [name] Primary Care [name]: [name] Other Clinician: Referring [name]: Treating [name]/Extender:[name], [name], [name] in Treatment: 85 Visit Information History Since Last Visit Added or deleted any medications: No Patient Arrived: Ambulatory Any new allergies or adverse reactions: No Arrival Time: 14:32 Had a fall or experienced change in No Accompanied By: self activities of daily living that may affect Transfer Assistance: None risk of falls: Patient Identification Verified: Yes Signs or symptoms of abuse/neglect since last No Secondary Verification Process Yes visito Completed: Hospitalized since last visit: No Patient Requires Transmission-Based No Implantable device outside of the clinic excluding No Precautions: cellular tissue based products placed in the center Patient Has Alerts: Yes since last visit: Patient Alerts: Patient on Blood Has Dressing in Place as Prescribed: Yes Thinner Pain Present Now: No xarelto R TBI = .86, L TBI = .87 Electronic Signature(s) Signed: 09/28/2019 9:23:26 AM By: [name] Entered By: [name] on 08/12/2019 14:32:42 -------------------------------------------------------------------------------- Clinic Level of Care Assessment Details Patient Name: Date of Service: [name], [name] 08/12/2019 2:15 PM Medical Record [id number] Patient Account Number: [id number] Date of Birth/Sex: Treating RN: 11-Dec-1955 (64 y.o. [name] Primary Care Lessly Stigler: [name] Other Clinician: Referring [name]: Treating [name]/Extender:[name], [name], [name] in  Treatment: 54 Clinic Level of Care Assessment Items TOOL 4 Quantity Score X - Use when only an EandM is performed on FOLLOW-UP visit 1 0 ASSESSMENTS - Nursing Assessment / Reassessment X - Reassessment of Co-morbidities (includes updates in patient status) 1 10 X - Reassessment of Adherence to Treatment Plan 1 5 ASSESSMENTS - Wound and Skin Assessment / Reassessment X - Simple Wound Assessment / Reassessment - one wound 1 5 []  - Complex Wound Assessment / Reassessment - multiple wounds 0 []  - Dermatologic / Skin Assessment (not related to wound area) 0 ASSESSMENTS - Focused Assessment X - Circumferential Edema Measurements - multi extremities 1 5 []  - Nutritional Assessment / Counseling / Intervention 0 []  - Lower Extremity Assessment (monofilament, tuning fork, pulses) 0 []  - Peripheral Arterial Disease Assessment (using hand held doppler) 0 ASSESSMENTS - Ostomy and/or Continence Assessment and Care []  - Incontinence Assessment and Management 0 []  - Ostomy Care Assessment and Management (repouching, etc.) 0 PROCESS - Coordination of Care X - Simple Patient / Family Education for ongoing care 1 15 []  - Complex (extensive) Patient / Family Education for ongoing care 0 X - Staff obtains Programmer, systems, Records, Test Results / Process Orders 1 10 []  - Staff telephones HHA, Nursing Homes / Clarify orders / etc 0 []  - Routine Transfer to another Facility (non-emergent condition) 0 []  - Routine Hospital Admission (non-emergent condition) 0 []  - New Admissions / Biomedical engineer / Ordering NPWT, Apligraf, etc. 0 []  - Emergency Hospital Admission (emergent condition) 0 X - Simple Discharge Coordination 1 10 []  - Complex (extensive) Discharge Coordination 0 PROCESS - Special Needs []  - Pediatric / Minor Patient Management 0 []  - Isolation Patient Management 0 []  - Hearing / Language / Visual special needs 0 []  - Assessment of Community assistance (transportation, D/C planning, etc.) 0 []   - Additional assistance / Altered mentation 0 []  - Support Surface(s) Assessment (bed, cushion, seat, etc.) 0 INTERVENTIONS - Wound Cleansing / Measurement X - Simple Wound Cleansing - one wound 1  5 []  - Complex Wound Cleansing - multiple wounds 0 X - Wound Imaging (photographs - any number of wounds) 1 5 []  - Wound Tracing (instead of photographs) 0 X - Simple Wound Measurement - one wound 1 5 []  - Complex Wound Measurement - multiple wounds 0 INTERVENTIONS - Wound Dressings []  - Small Wound Dressing one or multiple wounds 0 []  - Medium Wound Dressing one or multiple wounds 0 []  - Large Wound Dressing one or multiple wounds 0 []  - Application of Medications - topical 0 []  - Application of Medications - injection 0 INTERVENTIONS - Miscellaneous []  - External ear exam 0 []  - Specimen Collection (cultures, biopsies, blood, body fluids, etc.) 0 []  - Specimen(s) / Culture(s) sent or taken to Lab for analysis 0 []  - Patient Transfer (multiple staff / Civil Service fast streamer / Similar devices) 0 []  - Simple Staple / Suture removal (25 or less) 0 []  - Complex Staple / Suture removal (26 or more) 0 []  - Hypo / Hyperglycemic Management (close monitor of Blood Glucose) 0 []  - Ankle / Brachial Index (ABI) - do not check if billed separately 0 X - Vital Signs 1 5 Has the patient been seen at the hospital within the last three years: Yes Total Score: 80 Level Of Care: New/Established - Level 3 Electronic Signature(s) Signed: 08/12/2019 5:17:01 PM By: [name] Entered By: [name] on 08/12/2019 15:09:04 -------------------------------------------------------------------------------- Lower Extremity Assessment Details Patient Name: Date of Service: [name], [name] 08/12/2019 2:15 PM Medical Record [id number] Patient Account Number: [id number] Date of Birth/Sex: Treating RN: 12-07-1955 (64 y.o. [name] Primary Care [name]: [name] Other Clinician: Referring  [name]: Treating [name]/Extender:[name], [name], [name] in Treatment: 54 Edema Assessment Assessed: [Left: No] [Right: Yes] Edema: [Left: No] [Right: No] Calf Left: Right: Point of Measurement: 45 cm From Medial Instep cm 36 cm Ankle Left: Right: Point of Measurement: 13 cm From Medial Instep cm 22 cm Vascular Assessment Pulses: Dorsalis Pedis Palpable: [Right:Yes] Electronic Signature(s) Signed: 08/12/2019 4:47:37 PM By: Deon Pilling Entered By: Deon Pilling on 08/12/2019 14:36:19 -------------------------------------------------------------------------------- Multi Wound Chart Details Patient Name: Date of Service: [name] 08/12/2019 2:15 PM Medical Record [id number] Patient Account Number: [id number] Date of Birth/Sex: Treating RN: 22-Dec-1955 (64 y.o. [name] Primary Care Jed Kutch: [name] Other Clinician: Referring Tonika Eden: Treating Raahi Korber/Extender:[name], [name], [name] in Treatment: 54 Vital Signs Height(in): 73 Pulse(bpm): 47 Weight(lbs): 290 Blood Pressure(mmHg): 139/66 Body Mass Index(BMI): 38 Temperature(F): 98.2 Respiratory 18 Rate(breaths/min): Photos: [6:No Photos] [N/A:N/A] Wound Location: [6:Right, Proximal, Posterior N/A Lower Leg] Wounding Event: [6:Gradually Appeared] [N/A:N/A] Primary Etiology: [6:Venous Leg Ulcer] [N/A:N/A] Date Acquired: [6:01/06/2019] [N/A:N/A] Weeks of Treatment: [6:31] [N/A:N/A] Wound Status: [6:Healed - Epithelialized] [N/A:N/A] Clustered Wound: [6:Yes] [N/A:N/A] Measurements L x W x D 0x0x0 [N/A:N/A] (cm) Area (cm) : [6:0] [N/A:N/A] Volume (cm) : [6:0] [N/A:N/A] % Reduction in Area: [6:100.00%] [N/A:N/A] % Reduction in Volume: 100.00% [N/A:N/A] Classification: [6:Full Thickness Without Exposed Support Structures] [N/A:N/A] Treatment Notes Electronic Signature(s) Signed: 08/12/2019 5:17:01 PM By: [name] Signed:  08/12/2019 5:26:28 PM By: Linton Ham MD Entered By: Linton Ham on 08/12/2019 15:28:50 -------------------------------------------------------------------------------- Multi-Disciplinary Care Plan Details Patient Name: Date of Service: [name] 08/12/2019 2:15 PM Medical Record [id number] Patient Account Number: [id number] Date of Birth/Sex: Treating RN: [birth year] (64 y.o. [name] Primary Care Reagyn Facemire: [name] Other Clinician: Referring Arash Karstens: Treating Paulo Keimig/Extender:[name], [name], [name] in Treatment: 33 Active Inactive Electronic Signature(s) Signed: 08/12/2019 5:17:01 PM By: [name] Entered  By: [name] on 08/12/2019 15:00:50 -------------------------------------------------------------------------------- Pain Assessment Details Patient Name: Date of Service: [name], [name] 08/12/2019 2:15 PM Medical Record [name] Patient Account Number: [id number] Date of Birth/Sex: Treating RN: 1955/07/07 (64 y.o. [name] Primary Care Babatunde Seago: [name] Other Clinician: Referring Alaira Level: Treating Cai Flott/Extender:[name], [name], [name] in Treatment: 54 Active Problems Location of Pain Severity and Description of Pain Patient Has Paino No Site Locations Pain Management and Medication Current Pain Management: Electronic Signature(s) Signed: 08/12/2019 5:17:01 PM By: [name] Signed: 09/28/2019 9:23:26 AM By: [name] Entered By: [name] on 08/12/2019 14:33:12 -------------------------------------------------------------------------------- Wound Assessment Details Patient Name: Date of Service: [name] 08/12/2019 2:15 PM Medical Record [id number] Patient Account Number: [id number] Date of Birth/Sex: Treating RN: 1955/10/09 (64 y.o. [name], Tammi Klippel Primary Care Nussen Pullin: [name] Other  Clinician: Referring Rhodie Cienfuegos: Treating Susanne Baumgarner/Extender:[name], [name], [name] in Treatment: 54 Wound Status Wound Number: 6 Primary Venous Leg Ulcer Etiology: Wound Location: Right Lower Leg - Posterior, Proximal Wound Healed - Epithelialized Status: Wounding Event: Gradually Appeared Comorbid Asthma, Sleep Apnea, Arrhythmia, Congestive Date Acquired: 01/06/2019 History: Heart Failure, Hypertension, Type II Diabetes Weeks Of Treatment: 31 Clustered Wound: Yes Photos Wound Measurements Length: (cm) 0 % Reduct Width: (cm) 0 % Reduct Depth: (cm) 0 Epitheli Clustered Quantity: 0 Area: (cm) 0 Volume: (cm) 0 Wound Description Classification: Full Thickness Without Exposed Support Foul Odo Structures Slough/F Wound Flat and Intact Margin: Exudate Small Amount: Exudate Serosanguineous Type: Exudate red, brown Color: Wound Bed Granulation Amount: Large (67-100%) Granulation Quality: Pink Fascia E Necrotic Amount: None Present (0%) Fat Laye Tendon E Muscle E Joint Ex Bone Exp r After Cleansing: No ibrino No Exposed Structure xposed: No r (Subcutaneous Tissue) Exposed: Yes xposed: No xposed: No posed: No osed: No ion in Area: 100% ion in Volume: 100% alization: Large (67-100%) Electronic Signature(s) Signed: 08/17/2019 4:03:04 PM By: Mikeal Hawthorne EMT/HBOT Signed: 08/17/2019 5:43:51 PM By: Deon Pilling Previous Signature: 08/12/2019 4:47:37 PM Version By: Deon Pilling Entered By: Mikeal Hawthorne on 08/17/2019 13:09:47 -------------------------------------------------------------------------------- Vitals Details Patient Name: Date of Service: [name] 08/12/2019 2:15 PM Medical Record [id number] Patient Account Number: [id number] Date of Birth/Sex: Treating RN: 20-Oct-1955 (64 y.o. [name] Primary Care Joleena Weisenburger: Other Clinician: Pricilla [name] Referring Nonnie Pickney: Treating Ren Aspinall/Extender:[name],  [name], [name] in Treatment: 54 Vital Signs Time Taken: 14:32 Temperature (F): 98.2 Height (in): 73 Pulse (bpm): 47 Weight (lbs): 290 Respiratory Rate (breaths/min): 18 Body Mass Index (BMI): 38.3 Blood Pressure (mmHg): 139/66 Reference Range: 80 - 120 mg / dl Electronic Signature(s) Signed: 09/28/2019 9:23:26 AM By: [name] Entered By: [name] on 08/12/2019 14:33:07

## 2019-09-28 NOTE — Progress Notes (Signed)
Boldon, Javarious J. ([id number]) Visit Report for 08/05/2019 Arrival Information Details Patient Name: Date of Service: Fallas, Etheridge J. 08/05/2019 1:45 PM Medical Record Number:[id number] Patient Account Number: [id number] Date of Birth/Sex: Treating RN: 12/13/1955 (64 y.o. M) Dwiggins, Shannon Primary Care Provider: Crawford, Elizabeth Other Clinician: Referring Provider: Treating Provider/Extender:Robson, Michael Crawford, Elizabeth Weeks in Treatment: 53 Visit Information History Since Last Visit Added or deleted any medications: No Patient Arrived: Ambulatory Any new allergies or adverse reactions: No Arrival Time: 14:26 Had a fall or experienced change in No Accompanied By: self activities of daily living that may affect Transfer Assistance: None risk of falls: Patient Identification Verified: Yes Signs or symptoms of abuse/neglect since last No Secondary Verification Process Yes visito Completed: Hospitalized since last visit: No Patient Requires Transmission-Based No Implantable device outside of the clinic excluding No Precautions: cellular tissue based products placed in the center Patient Has Alerts: Yes since last visit: Patient Alerts: Patient on Blood Has Dressing in Place as Prescribed: Yes Thinner Pain Present Now: No xarelto R TBI = .86, L TBI = .87 Electronic Signature(s) Signed: 09/28/2019 9:23:26 AM By: Dawkins, Destiny Entered By: Dawkins, Destiny on 08/05/2019 14:27:19 -------------------------------------------------------------------------------- Compression Therapy Details Patient Name: Date of Service: Banes, Hayes J. 08/05/2019 1:45 PM Medical Record Number:[id number] Patient Account Number: [id number] Date of Birth/Sex: Treating RN: 08/24/1955 (64 y.o. M) Dwiggins, Shannon Primary Care Provider: Crawford, Elizabeth Other Clinician: Referring Provider: Treating Provider/Extender:Robson, Michael Crawford, Elizabeth Weeks in Treatment:  53 Compression Therapy Performed for Wound Wound #6 Right,Proximal,Posterior Lower Leg Assessment: Performed By: Clinician Deaton, Bobbi, RN Compression Type: Three Layer Post Procedure Diagnosis Same as Pre-procedure Electronic Signature(s) Signed: 08/08/2019 1:40:10 PM By: Dwiggins, Shannon Entered By: Dwiggins, Shannon on 08/05/2019 15:23:05 -------------------------------------------------------------------------------- Encounter Discharge Information Details Patient Name: Date of Service: Llewellyn, Emileo J. 08/05/2019 1:45 PM Medical Record Number:[id number] Patient Account Number: [id number] Date of Birth/Sex: Treating RN: 05/30/1956 (64 y.o. M) Lynch, Shatara Primary Care Provider: Crawford, Elizabeth Other Clinician: Referring Provider: Treating Provider/Extender:Robson, Michael Crawford, Elizabeth Weeks in Treatment: 53 Encounter Discharge Information Items Discharge Condition: Stable Ambulatory Status: Ambulatory Discharge Destination: Home Transportation: Private Auto Accompanied By: alone Schedule Follow-up Appointment: Yes Clinical Summary of Care: Patient Declined Electronic Signature(s) Signed: 08/11/2019 8:56:01 AM By: Lynch, Shatara RN, BSN Entered By: Lynch, Shatara on 08/05/2019 17:47:58 -------------------------------------------------------------------------------- Lower Extremity Assessment Details Patient Name: Date of Service: Dunckel, Kabeer J. 08/05/2019 1:45 PM Medical Record Number:[id number] Patient Account Number: [id number] Date of Birth/Sex: Treating RN: 11/04/1955 (64 y.o. M) Lynch, Shatara Primary Care Provider: Crawford, Elizabeth Other Clinician: Referring Provider: Treating Provider/Extender:Robson, Michael Crawford, Elizabeth Weeks in Treatment: 53 Edema Assessment Assessed: [Left: No] [Right: No] Edema: [Left: No] [Right: No] Calf Left: Right: Point of Measurement: 45 cm From Medial Instep 38.6 cm 37 cm Ankle Left: Right: Point of  Measurement: 13 cm From Medial Instep 22.3 cm 22 cm Vascular Assessment Pulses: Dorsalis Pedis Palpable: [Left:Yes] [Right:Yes] Electronic Signature(s) Signed: 08/11/2019 8:56:01 AM By: Lynch, Shatara RN, BSN Entered By: Lynch, Shatara on 08/05/2019 14:47:11 -------------------------------------------------------------------------------- Multi Wound Chart Details Patient Name: Date of Service: Latimore, Linas J. 08/05/2019 1:45 PM Medical Record Number:[id number] Patient Account Number: [id number] Date of Birth/Sex: Treating RN: 12/13/1955 (64 y.o. M) Dwiggins, Shannon Primary Care Provider: Crawford, Elizabeth Other Clinician: Referring Provider: Treating Provider/Extender:Robson, Michael Crawford, Elizabeth Weeks in Treatment: 53 Vital Signs Height(in): 73 Pulse(bpm): 51 Weight(lbs): 290 Blood Pressure(mmHg): 144/80 Body Mass Index(BMI): 38 Temperature(°F): 97.9 Respiratory 18 Rate(breaths/min): Photos: [6:No Photos] [8RR:No Photos] [N/A:N/A] Wound Location: [6:Right Lower Leg - Posterior,   Left, Anterior Lower Leg Proximal] [N/A:N/A] Wounding Event: [6:Gradually Appeared] [8RR:Gradually Appeared] [N/A:N/A] Primary Etiology: [6:Venous Leg Ulcer] [8RR:Venous Leg Ulcer] [N/A:N/A] Comorbid History: [6:Asthma, Sleep Apnea, Arrhythmia, Congestive Heart Failure, Hypertension, Heart Failure, Hypertension, Type II Diabetes] [8RR:Asthma, Sleep Apnea, Arrhythmia, Congestive Type II Diabetes] [N/A:N/A] Date Acquired: [6:01/06/2019] [8RR:02/14/2019] [N/A:N/A] Weeks of Treatment: [6:30] [8RR:24] [N/A:N/A] Wound Status: [6:Open] [8RR:Healed - Epithelialized] [N/A:N/A] Wound Recurrence: [6:No] [8RR:Yes] [N/A:N/A] Clustered Wound: [6:Yes] [8RR:No] [N/A:N/A] Clustered Quantity: [6:0] [8RR:N/A] [N/A:N/A] Measurements L x W x D 0.5x0.3x0.1 [8RR:0x0x0] [N/A:N/A] (cm) Area (cm) : [6:0.118] [8RR:0] [N/A:N/A] Volume (cm) : [6:0.012] [8RR:0] [N/A:N/A] % Reduction in Area: [6:70.00%] [8RR:100.00%]  [N/A:N/A] % Reduction in Volume: [6:69.20%] [8RR:100.00%] [N/A:N/A] Classification: [6:Full Thickness Without Exposed Support Structures Exposed Support Structures] [8RR:Full Thickness Without] [N/A:N/A] Exudate Amount: [6:Small] [8RR:None Present] [N/A:N/A] Exudate Type: [6:Serosanguineous] [8RR:N/A] [N/A:N/A] Exudate Color: [6:red, brown] [8RR:N/A] [N/A:N/A] Wound Margin: [6:Flat and Intact] [8RR:Flat and Intact] [N/A:N/A] Granulation Amount: [6:Large (67-100%)] [8RR:None Present (0%)] [N/A:N/A] Granulation Quality: [6:Pink] [8RR:N/A] [N/A:N/A] Necrotic Amount: [6:None Present (0%)] [8RR:None Present (0%)] [N/A:N/A] Exposed Structures: [6:Fat Layer (Subcutaneous Fascia: No Tissue) Exposed: Yes Fascia: No Tendon: No Muscle: No Joint: No Bone: No] [8RR:Fat Layer (Subcutaneous Tissue) Exposed: No Tendon: No Muscle: No Joint: No Bone: No] [N/A:N/A] Epithelialization: [6:Large (67-100%) Compression Therapy] [8RR:Large (67-100%) N/A] [N/A:N/A N/A] Treatment Notes Electronic Signature(s) Signed: 08/05/2019 6:29:59 PM By: Linton Ham MD Signed: 08/08/2019 1:40:10 PM By: Kela Millin Entered By: Linton Ham on 08/05/2019 15:24:49 -------------------------------------------------------------------------------- Multi-Disciplinary Care Plan Details Patient Name: Date of Service: [name] 08/05/2019 1:45 PM Medical Record [id number] Patient Account Number: [id number] Date of Birth/Sex: Treating RN: September 07, 1955 (64 y.o. [name] Primary Care Lynnsie Linders: [name] Other Clinician: Referring Rivka Baune: Treating Maxemiliano Riel/Extender:Robson, [name], [name] in Treatment: 57 Active Inactive Venous Leg Ulcer Nursing Diagnoses: Actual venous Insuffiency (use after diagnosis is confirmed) Knowledge deficit related to disease process and management Goals: Non-invasive venous studies are completed as ordered Date Initiated: 09/17/2018 Date  Inactivated: 09/23/2018 Target Resolution Date: 09/23/2018 Goal Status: Met Patient will maintain optimal edema control Date Initiated: 09/17/2018 Target Resolution Date: 09/09/2019 Goal Status: Active Patient/caregiver will verbalize understanding of disease process and disease management Date Initiated: 09/17/2018 Date Inactivated: 09/23/2018 Target Resolution Date: 10/15/2018 Goal Status: Met Interventions: Assess peripheral edema status every visit. Compression as ordered Provide education on venous insufficiency Treatment Activities: Therapeutic compression applied : 09/17/2018 Notes: Electronic Signature(s) Signed: 08/08/2019 1:40:10 PM By: Kela Millin Entered By: Kela Millin on 08/05/2019 15:22:20 -------------------------------------------------------------------------------- Pain Assessment Details Patient Name: Date of Service: PAARTH, CROPPER 08/05/2019 1:45 PM Medical Record [id number] Patient Account Number: [id number] Date of Birth/Sex: Treating RN: Sep 21, 1955 (64 y.o. [name] Primary Care Idalia Allbritton: [name] Other Clinician: Referring Kerolos Nehme: Treating Chuck Caban/Extender:Robson, [name], [name] in Treatment: [phone] Active Problems Location of Pain Severity and Description of Pain Patient Has Paino No Site Locations Pain Management and Medication Current Pain Management: Electronic Signature(s) Signed: 08/08/2019 1:40:10 PM By: Kela Millin Signed: 09/28/2019 9:23:26 AM By: Sandre Kitty Entered By: Sandre Kitty on 08/05/2019 14:29:31 -------------------------------------------------------------------------------- Patient/Caregiver Education Details Patient Name: Date of Service: [name] 2/19/2021andnbsp1:45 PM Medical Record [id number] Patient Account Number: [id number] Date of Birth/Gender: Treating RN: 1955/06/23 (64 y.o. [name] Primary Care Physician: [name] Other Clinician: Referring Physician: Treating Physician/Extender:Robson, [name], [name] in Treatment: 32 Education Assessment Education Provided To: Patient Education Topics Provided Venous: Methods: Explain/Verbal Responses: State content correctly Electronic Signature(s) Signed: 08/08/2019 1:40:10 PM By: Kela Millin Entered By:  Kela Millin on 08/05/2019 15:22:33 -------------------------------------------------------------------------------- Wound Assessment Details Patient Name: Date of Service: HEMI, CHACKO 08/05/2019 1:45 PM Medical Record [id number] Patient Account Number: [id number] Date of Birth/Sex: Treating RN: 23-Sep-1955 (64 y.o. [name] Primary Care Scarlet Abad: [name] Other Clinician: Referring Tavionna Grout: Treating Araina Butrick/Extender:Robson, [name], [name] in Treatment: 53 Wound Status Wound Number: [id number] Primary Venous Leg Ulcer Etiology: Wound Location: Right Lower Leg - Posterior, Proximal Wound Open Status: Wounding Event: Gradually Appeared ComorbidAsthma, Sleep Apnea, Arrhythmia, Congestive Date Acquired: 01/06/2019 Date Acquired: 01/06/2019 History: Heart Failure, Hypertension, Type II Diabetes Weeks Of Treatment: 30 Clustered Wound: Yes Photos Wound Measurements Length: (cm) 0.5 % Reduct Width: (cm) 0.3 % Reduct Depth: (cm) 0.1 Epitheli Clustered Quantity: 0 Tunnelin Area: (cm) 0.118 Undermi Volume: (cm) 0.012 Wound Description Full Thickness Without Exposed Support Foul Odo Classification: Structures Slough/F Wound Flat and Intact Margin: Exudate Small Amount: Exudate Serosanguineous Type: Exudate red, brown Color: Wound Bed Granulation Amount: Large (67-100%) Granulation Quality: Pink Fascia E Necrotic Amount: None Present (0%) Fat Laye Tendon E Muscle E Joint Ex Bone Exp r After Cleansing: No ibrino No Exposed Structure xposed: No r  (Subcutaneous Tissue) Exposed: Yes xposed: No xposed: No posed: No osed: No ion in Area: 70% ion in Volume: 69.2% alization: Large (67-100%) g: No ning: No Electronic Signature(s) Signed: 08/08/2019 1:40:10 PM By: Kela Millin Signed: 08/08/2019 4:33:06 PM By: Mikeal Hawthorne EMT/HBOT Entered By: Mikeal Hawthorne on 08/08/2019 13:39:27 -------------------------------------------------------------------------------- Wound Assessment Details Patient Name: Date of Service: [name] 08/05/2019 1:45 PM Medical Record [id number] Patient Account Number: [id number] Date of Birth/Sex: Treating RN: 1955/06/29 (64 y.o. [name] Primary Care Constance Whittle: Other Clinician: [name] Referring Nadyne Gariepy: Treating Ludene Stokke/Extender:Robson, [name], [name] in Treatment: 53 Wound Status Wound Number: [id number] Primary Venous Leg Ulcer Etiology: Wound Location: Left Lower Leg - Anterior Wound Healed - Epithelialized Wounding Event: Gradually Appeared Status: Date Acquired: 02/14/2019 Comorbid Asthma, Sleep Apnea, Arrhythmia, Congestive Weeks Of Treatment: 24 History: Heart Failure, Hypertension, Type II Diabetes Clustered Wound: No Photos Wound Measurements Length: (cm) 0 % Reduct Width: (cm) 0 % Reduct Depth: (cm) 0 Epitheli Area: (cm) 0 Tunneli Volume: (cm) 0 Undermi Wound Description Full Thickness Without Exposed Support Foul Odo Classification: Structures Slough/F Wound Flat and Intact Margin: Exudate None Present Amount: Wound Bed Granulation Amount: None Present (0%) Necrotic Amount: None Present (0%) Fascia E Fat Laye Tendon E Muscle E Joint Ex Bone Exp r After Cleansing: No ibrino No Exposed Structure xposed: No r (Subcutaneous Tissue) Exposed: No xposed: No xposed: No posed: No osed: No ion in Area: 100% ion in Volume: 100% alization: Large (67-100%) ng: No ning: No Electronic Signature(s) Signed: 08/08/2019  4:33:06 PM By: Mikeal Hawthorne EMT/HBOT Signed: 08/11/2019 8:56:01 AM By: Levan Hurst RN, BSN Entered By: Mikeal Hawthorne on 08/08/2019 13:38:58 -------------------------------------------------------------------------------- Vitals Details Patient Name: Date of Service: [name] 08/05/2019 1:45 PM Medical Record [id number] Patient Account Number: [id number] Date of Birth/Sex: Treating RN: 05-29-1956 (64 y.o. [name] Primary Care Daxtin Leiker: [name] Other Clinician: Referring Deacon Gadbois: Treating Tylin Stradley/Extender:Robson, [name], [name] in Treatment: 53 Vital Signs Time Taken: 14:27 Temperature (F): 97.9 Height (in): 73 Pulse (bpm): 51 Weight (lbs): 290 Respiratory Rate (breaths/min): 18 Body Mass Index (BMI): 38.3 Blood Pressure (mmHg): 144/80 Reference Range: 80 - 120 mg / dl Electronic Signature(s) Signed: 09/28/2019 9:23:26 AM By: Sandre Kitty Entered By: Sandre Kitty on 08/05/2019 14:29:27

## 2019-09-29 NOTE — Assessment & Plan Note (Signed)
We talked about how even though his HgA1c was good at last visit he could have changed so much with pancreatic function decreasing and insensitivity to insulin causing him to not be able to keep up with sugars even though diet had not changed. Referral to endocrinology per request. Continue novolog and levemir at this time and refilled freestyle libre sensors for use.

## 2019-09-29 NOTE — Assessment & Plan Note (Signed)
Elevated due to not aking meds prior to visit. Continue regimen of losartan 12.5 mg daily and lasix 40 mg prn and spironolactone 25 mg daily. Checking CMP and CBC today. Adjust as needed.

## 2019-09-29 NOTE — Assessment & Plan Note (Signed)
Stable no flare taking symbicort as needed and albuterol as needed.

## 2019-10-03 DIAGNOSIS — E119 Type 2 diabetes mellitus without complications: Secondary | ICD-10-CM | POA: Diagnosis not present

## 2019-10-13 ENCOUNTER — Encounter: Payer: Self-pay | Admitting: Cardiology

## 2019-10-13 ENCOUNTER — Ambulatory Visit: Payer: BC Managed Care – PPO | Admitting: Cardiology

## 2019-10-13 ENCOUNTER — Other Ambulatory Visit: Payer: Self-pay

## 2019-10-13 VITALS — BP 122/88 | HR 74 | Ht 73.0 in | Wt 294.0 lb

## 2019-10-13 DIAGNOSIS — I5042 Chronic combined systolic (congestive) and diastolic (congestive) heart failure: Secondary | ICD-10-CM | POA: Diagnosis not present

## 2019-10-13 DIAGNOSIS — I10 Essential (primary) hypertension: Secondary | ICD-10-CM

## 2019-10-13 DIAGNOSIS — Z7189 Other specified counseling: Secondary | ICD-10-CM

## 2019-10-13 DIAGNOSIS — I480 Paroxysmal atrial fibrillation: Secondary | ICD-10-CM

## 2019-10-13 DIAGNOSIS — I428 Other cardiomyopathies: Secondary | ICD-10-CM | POA: Diagnosis not present

## 2019-10-13 MED ORDER — DABIGATRAN ETEXILATE MESYLATE 150 MG PO CAPS: 150.0000 mg | ORAL_CAPSULE | Freq: Two times a day (BID) | ORAL | 3 refills | Status: DC

## 2019-10-13 NOTE — Progress Notes (Signed)
Cardiology Office Note:    Date:  10/13/2019   ID:  [name], DOB [birth year], MRN [name]  PCP:  [name], MD  Cardiologist:  Buford Dresser, MD PhD  Referring MD: [name], *   CC: follow up  History of Present Illness:    [name] is a 64 y.o. male with a hx of persistent atrial fibrillation, atrial flutter with prior ablation in 2012, prior cardioversions, HTN, chronic systolic heart failure (thought to be tachycardia induced) with EF 30-35% on tte 04/2018, OSA who is seen in follow up today. I first saw him as a new consult 12/28/18.  Cardiac history: 12/14/18 Was planned for afib ablation. However, TEE performed was concerning for thrombus. Also noted to have EF 30%, severe biatrial enlargement. 04/2018: TTE, EF 30-35%, diffuse hypokinesis, biatrial enlargement  Cardiovascular risk factors: Prior clinical ASCVD: no known ASCVD, no prior cath Comorbid conditions, including hypertension, hyperlipidemia, diabetes, chronic kidney disease: yes: HTN, DM, HLD. No formal CKD, but Cr 2.11, had been 1.5-1.6  Today: Legs all healed, wearing compression stockings today. Having issues running out of levemir pens before refills due, plans to talk to pharmacy. Overall well controlled sugars with occasional spikes. Usually between 120-170 during the day, rare 200s.  Reviewed labs from 09/26/19 with Dr. Sharlet Salina.   BP today well controlled. Doesn't check at home. Taking lasix daily, but only in the morning (had also been taking at night).   Pradaxa will be expensive for him on his new insurance in July, reviewed that this not yet generic. Discussed options for this.  Denies chest pain, shortness of breath at rest or with normal exertion. No PND, orthopnea, or unexpected weight gain. No syncope or palpitations.   Past Medical History:  Diagnosis Date  . Asthma    as a teenager - does not use an inhaler although pt said he has an albuteral  inhaler  . Atrial fibrillation ([place])    persistant 02/2009  . Atrial flutter ([place])    s/p CTI ablation 08/06/10  . Cataract   . Chronic rhinitis   . Colon polyp   . Congestive heart failure ([place])   . Diverticulosis    colonoscopy 04/03/2009  . Hyperlipidemia   . Hypertension   . Morbid obesity ([place])    target weight = 219  for BMI < 30  . Nonischemic cardiomyopathy (HCC)    tachycardia mediated  . Seasonal allergies   . Sleep apnea    original 2001 - wears C-PaP  . Type 2 diabetes mellitus ([place])     Past Surgical History:  Procedure Laterality Date  . ATRIAL FIBRILLATION ABLATION N/A 12/14/2018   Procedure: ATRIAL FIBRILLATION ABLATION;  Surgeon: Thompson Grayer, MD;  Location: [facility];  Service: Cardiovascular;  Laterality: N/A;  . atrial flutter ablation  08/06/10  . CARDIOVERSION N/A 07/05/2018   Procedure: CARDIOVERSION;  Surgeon: Buford Dresser, MD;  Location: [facility];  Service: Cardiovascular;  Laterality: N/A;  . COLONOSCOPY    . POLYPECTOMY    . TEE WITHOUT CARDIOVERSION N/A 12/14/2018   Procedure: TRANSESOPHAGEAL ECHOCARDIOGRAM (TEE);  Surgeon: Thompson Grayer, MD;  Location: [facility];  Service: Cardiovascular;  Laterality: N/A;    Current Medications: Current Outpatient Medications on File Prior to Visit  Medication Sig  . albuterol (PROVENTIL HFA;VENTOLIN HFA) 108 (90 Base) MCG/ACT inhaler Inhale 2 puffs into the lungs every 6 (six) hours as needed for wheezing or shortness of breath.  . Continuous  Blood Gluc Sensor (FREESTYLE LIBRE 14 DAY SENSOR) MISC Use as directed by Diabetes educator  . doxylamine, Sleep, (UNISOM) 25 MG tablet Take 50 mg by mouth at bedtime as needed for sleep.   . folic acid (FOLVITE) 1 MG tablet Take 1 tablet (1 mg total) by mouth daily.  . furosemide (LASIX) 40 MG tablet Take 1 tablet (40 mg total) by mouth daily as needed for fluid or edema (leg swellings/shortness of breath /weight gain >5 punds in 48 hrs).  .  gabapentin (NEURONTIN) 300 MG capsule TAKE 1 CAPSULE BY MOUTH THREE TIMES A DAY (Patient taking differently: Take 600 mg by mouth in the morning and at bedtime. )  . insulin aspart (NOVOLOG FLEXPEN) 100 UNIT/ML FlexPen Per sliding scale as needed four times daily BG 70-120: 0 Units, BG 121-150:3 units, BG 151-200:4 Units, BG 201-250:7 units BG 251-300: 11 Units, 301-350 : 15 units. If BG >350: Call your primary care doctor  . insulin aspart (NOVOLOG) 100 UNIT/ML FlexPen Inject 5 Units into the skin 3 (three) times daily with meals.  . insulin detemir (LEVEMIR) 100 UNIT/ML FlexPen Inject 40 Units into the skin 2 (two) times daily.  . Insulin Pen Needle (PEN NEEDLES) 32G X 5 MM MISC 1 application by Does not apply route 4 (four) times daily -  with meals and at bedtime.  [name] levothyroxine (SYNTHROID) 50 MCG tablet Take 1 tablet (50 mcg total) by mouth daily before breakfast.  . losartan (COZAAR) 25 MG tablet Take 0.5 tablets (12.5 mg total) by mouth every morning.  . mometasone (NASONEX) 50 MCG/ACT nasal spray Place 2 sprays into the nose daily as needed (allergies or rhinitis).   [name] PRADAXA 150 MG CAPS capsule TAKE 1 CAPSULE BY MOUTH TWICE A DAY (Patient taking differently: Take 150 mg by mouth 2 (two) times daily. )  . rosuvastatin (CRESTOR) 10 MG tablet TAKE 1 TABLET BY MOUTH EVERY DAY (Patient taking differently: Take 10 mg by mouth at bedtime. )  . spironolactone (ALDACTONE) 25 MG tablet Take 25 mg by mouth in the morning.   . SYMBICORT 80-4.5 MCG/ACT inhaler USE 2 INHALATIONS TWICE A DAY (Patient taking differently: Inhale 2 puffs into the lungs 2 (two) times daily as needed (for flares). )  . thiamine 100 MG tablet Take 1 tablet (100 mg total) by mouth daily.  . traMADol (ULTRAM) 50 MG tablet Take 50 mg by mouth every 6 (six) hours as needed (for pain).   . vitamin C (ASCORBIC ACID) 500 MG tablet Take 500-1,000 mg by mouth daily.   No current facility-administered medications on file prior to  visit.     Allergies:   Patient has no known allergies.   Social History   Tobacco Use  . Smoking status: Never Smoker  . Smokeless tobacco: Never Used  Substance Use Topics  . Alcohol use: Yes    Alcohol/week: 21.0 standard drinks    Types: 21 Cans of beer per week    Comment: previously quite heavy, working on cessation   . Drug use: No     Family History: The patient's family history includes COPD in his father; Colon cancer in his brother; Hypertension in his mother; Kidney failure in his mother. There is no history of Esophageal cancer, Rectal cancer, or Stomach cancer.  ROS:   Please see the history of present illness.  Additional pertinent ROS otherwise unremarkable.  EKGs/Labs/Other Studies Reviewed:    The following studies were reviewed today: TTE 09/24/19 1. Left ventricular ejection  fraction, by estimation, is 40 to 45%. The  left ventricle has mildly decreased function. The left ventricle  demonstrates global hypokinesis. There is severe left ventricular  hypertrophy, asymmetric and septal predominant  (parasternal views of the septum raise question of HOCM or infiltrative  cardiomyopathy - cardiac MRI could be considered if clinically  appropriate). Left ventricular diastolic parameters are consistent with  Grade I diastolic dysfunction (impaired  relaxation).  2. Right ventricular systolic function is normal. The right ventricular  size is normal. Tricuspid regurgitation signal is inadequate for assessing  PA pressure.  3. Left atrial size was mildly dilated.  4. The mitral valve is grossly normal. Trivial mitral valve  regurgitation.  5. The aortic valve was not well visualized. Aortic valve regurgitation  is not visualized.  6. Aortic dilatation noted. There is mild dilatation of the ascending  aorta.  7. The inferior vena cava is normal in size with greater than 50%  respiratory variability, suggesting right atrial pressure of 3 mmHg.    Lexiscan 04/07/19  Nuclear stress EF: 49%.  The left ventricular ejection fraction is mildly decreased (45-54%).  No T wave inversion was noted during stress.  There was no ST segment deviation noted during stress.  Defect 1: There is a small defect of mild severity.  This is an intermediate risk study.   Small size, mild intensity fixed apical perfusion defect, likely thinning. No significant reversible ischemia. Dilated LV with mild hypokinesis - LVEF 49%. This is an intermediate risk study due to decreased LV function.  TTE 05/03/18 Left ventricle: The cavity size was normal. There was severe   concentric hypertrophy. Systolic function was moderately to   severely reduced. The estimated ejection fraction was in the   range of 30% to 35%. Diffuse hypokinesis. The study was not   technically sufficient to allow evaluation of LV diastolic   dysfunction due to atrial fibrillation. - Aortic valve: There was no regurgitation. - Aortic root: The aortic root was normal in size. - Mitral valve: There was trivial regurgitation. - Left atrium: The atrium was severely dilated. - Right ventricle: Systolic function was normal. - Right atrium: The atrium was moderately dilated. - Tricuspid valve: There was trivial regurgitation. - Pulmonic valve: Transvalvular velocity was within the normal   range. There was no evidence for stenosis. - Pulmonary arteries: Systolic pressure was within the normal   range. - Inferior vena cava: The vessel was normal in size. The   respirophasic diameter changes were in the normal range (= 50%),   consistent with normal central venous pressure. - Pericardium, extracardiac: There was no pericardial effusion.  Impressions: - When compared to the prior study from 04/02/2017 LVEF has   decreased from 55-60% to 30-35% with diffuse hypokinesis.  TEE 12/14/18  1. The left ventricle has moderate-severely reduced systolic function, with an ejection fraction  of 30-35%. The cavity size was moderately dilated. Left ventrical global hypokinesis without regional wall motion abnormalities.  2. The right ventricle has normal systolc function. The cavity was normal. There is no increase in right ventricular wall thickness.  3. Left atrial size was severely dilated.  4. Dense smoke in atria and appendage. Stagnant pre thrombus and likely thrombus in mid to distal LAA High risk for embolic event EP procedure cancelled.  5. Right atrial size was moderately dilated.  6. The mitral valve is grossly normal. Mild thickening of the mitral valve leaflet. Mild calcification of the mitral valve leaflet. Mitral valve regurgitation is mild  to moderate by color flow Doppler.  7. Restricted posterior leaflet motion.  8. The aortic valve is tricuspid Moderate thickening of the aortic valve Sclerosis without any evidence of stenosis of the aortic valve. Aortic valve regurgitation is trivial by color flow Doppler.  9. The aortic root is normal in size and structure.  EKG:  EKG is personally reviewed.  The ekg ordered 09/04/19 demonstrates sinus bradycardia with 1st degree AV block, RBBB, LAFB  Recent Labs: 02/05/2019: B Natriuretic Peptide 382.6 03/08/2019: Pro B Natriuretic peptide (BNP) 215.0 09/03/2019: TSH 33.362 09/26/2019: ALT 18; BUN 16; Creatinine, Ser 1.48; Hemoglobin 14.8; Magnesium 1.9; Platelets 191.0; Potassium 3.9; Sodium 138  Recent Lipid Panel    Component Value Date/Time   CHOL 136 03/08/2019 0946   TRIG 58.0 03/08/2019 0946   HDL 64.10 03/08/2019 0946   CHOLHDL 2 03/08/2019 0946   VLDL 11.6 03/08/2019 0946   LDLCALC 61 03/08/2019 0946   LDLDIRECT 81.0 08/31/2015 0905    Physical Exam:    VS:  BP 122/88   Pulse 74   Ht 6\' 1"  (1.854 m)   Wt 294 lb (133.4 kg)   SpO2 98%   BMI 38.79 kg/m     Wt Readings from Last 3 Encounters:  10/13/19 294 lb (133.4 kg)  09/26/19 297 lb (134.7 kg)  09/07/19 291 lb 0.1 oz (132 kg)   GEN: Well nourished, well  developed in no acute distress HEENT: Normal, moist mucous membranes NECK: No JVD CARDIAC: regular rhythm, normal S1 and S2, no rubs or gallops. No murmur. VASCULAR: Radial and DP pulses 2+ bilaterally. No carotid bruits RESPIRATORY:  Clear to auscultation without rales, wheezing or rhonchi  ABDOMEN: Soft, non-tender, non-distended MUSCULOSKELETAL:  Ambulates independently SKIN: bilateral LE compression stockings, trivial edema, much improved from prior NEUROLOGIC:  Alert and oriented x 3. No focal neuro deficits noted. PSYCHIATRIC:  Normal affect    ASSESSMENT:    1. NICM (nonischemic cardiomyopathy) (HCC)   2. Paroxysmal atrial fibrillation ([place])   3. Essential hypertension   4. Chronic combined systolic and diastolic heart failure (HCC)   5. Cardiac risk counseling    PLAN:    Nonischemic cardiomyopathy, with chronic systolic and diastolic heart failure:  -euvolemic, NYHA class I -had planned for [facility], then had asymptomatic covid positive test, then renal failure. Lexiscan done, no reversible ischemia. -amiodarone and carvedilol stopped during admission 08/2019 due to symptomatic bradycardia, in the setting of hyperosmolar hyperglycemia. -off hydralazine 50 mg BID, imdur 30 mg daily. BP well controlled -he has tolerated losartan and spironolactone, Cr has improved to 1.48 -doing well on current dose of furosemide -consider entresto gentle start/titration if stable at follow up in 3 mos. -repeat echo 09/04/19 with EF 40-45% -with improvement in renal function, if EF remains reduced consider cMRI in the future.  Discussed options, prefers to watch and wait.  History of atrial fibrillation and atrial flutter: CHADSVASC=3 S/p CTI ablation 2012, plans for ablation aborted due to thrombus in LAA.  Has OSA, encouraged adherence to CPAP Amiodarone, carvedilol discontinued due to bradycardia with recent hospitalization Continue dabigatran, refilled today. Need to monitor  cost/affordability Baseline conduction disease  Hypertension: -at goal of <130/80 today, continue medications as above  Type II diabetes -consider SGLT2i or GLP1RA given heart failure, but will defer to Dr. Renne Crigler (upcoming appt)  Cardiac risk counseling and prevention recommendations: -recommend heart healthy/Mediterranean diet, with whole grains, fruits, vegetable, fish, lean meats, nuts, and olive oil. Limit salt. -recommend moderate walking, 3-5 times/week  for 30-50 minutes each session. Aim for at least 150 minutes.week. Goal should be pace of 3 miles/hours, or walking 1.5 miles in 30 minutes -recommend avoidance of tobacco products. Avoid excess alcohol. -continue rosuvastatin given diabetes, ASCVD risk. SGLT2i or GLP1RA may be beneficial in the future, though with prior renal failure may prefer GLP1RA. -ASCVD risk score: The 10-year ASCVD risk score Mikey Bussing DC Brooke Bonito., et al., 2013) is: 15.3%   Values used to calculate the score:     Age: 32 years     Sex: Male     Is Non-Hispanic African American: No     Diabetic: Yes     Tobacco smoker: No     Systolic Blood Pressure: 123XX123 mmHg     Is BP treated: Yes     HDL Cholesterol: 64.1 mg/dL     Total Cholesterol: 136 mg/dL    Plan for follow up: 3 mos or sooner PRN  Medication Adjustments/Labs and Tests Ordered: Current medicines are reviewed at length with the patient today.  Concerns regarding medicines are outlined above.  No orders of the defined types were placed in this encounter.  Meds ordered this encounter  Medications  . dabigatran (PRADAXA) 150 MG CAPS capsule    Sig: Take 1 capsule (150 mg total) by mouth 2 (two) times daily.    Dispense:  180 capsule    Refill:  3    Patient Instructions  Medication Instructions:  Your Physician recommend you continue on your current medication as directed.    *If you need a refill on your cardiac medications before your next appointment, please call your pharmacy*   Lab  Work: None  Testing/Procedures: None   Follow-Up: At [facility], you and your health needs are our priority.  As part of our continuing mission to provide you with exceptional heart care, we have created designated Provider Care Teams.  These Care Teams include your primary Cardiologist (physician) and Advanced Practice Providers (APPs -  Physician Assistants and Nurse Practitioners) who all work together to provide you with the care you need, when you need it.  We recommend signing up for the patient portal called "MyChart".  Sign up information is provided on this After Visit Summary.  MyChart is used to connect with patients for Virtual Visits (Telemedicine).  Patients are able to view lab/test results, encounter notes, upcoming appointments, etc.  Non-urgent messages can be sent to your provider as well.   To learn more about what you can do with MyChart, go to [email or web address].    Your next appointment:   3 month(s)  The format for your next appointment:   In Person  Provider:   Buford Dresser, MD  Endocrinologist: Dr. Philemon Kingdom, MD     Signed, Buford Dresser, MD PhD 10/13/2019  [place]

## 2019-10-13 NOTE — Patient Instructions (Addendum)
Medication Instructions:  Your Physician recommend you continue on your current medication as directed.    *If you need a refill on your cardiac medications before your next appointment, please call your pharmacy*   Lab Work: None  Testing/Procedures: None   Follow-Up: At [facility], you and your health needs are our priority.  As part of our continuing mission to provide you with exceptional heart care, we have created designated Provider Care Teams.  These Care Teams include your primary Cardiologist (physician) and Advanced Practice Providers (APPs -  Physician Assistants and Nurse Practitioners) who all work together to provide you with the care you need, when you need it.  We recommend signing up for the patient portal called "MyChart".  Sign up information is provided on this After Visit Summary.  MyChart is used to connect with patients for Virtual Visits (Telemedicine).  Patients are able to view lab/test results, encounter notes, upcoming appointments, etc.  Non-urgent messages can be sent to your provider as well.   To learn more about what you can do with MyChart, go to [email or web address].    Your next appointment:   3 month(s)  The format for your next appointment:   In Person  Provider:   Buford Dresser, MD  Endocrinologist: Dr. Philemon Kingdom, MD

## 2019-10-14 ENCOUNTER — Telehealth: Payer: Self-pay | Admitting: Internal Medicine

## 2019-10-14 NOTE — Telephone Encounter (Signed)
    Patient requesting referral be sent to [facility] to [facility]

## 2019-11-01 ENCOUNTER — Ambulatory Visit: Payer: BC Managed Care – PPO | Admitting: Internal Medicine

## 2019-11-01 ENCOUNTER — Encounter: Payer: Self-pay | Admitting: Internal Medicine

## 2019-11-01 ENCOUNTER — Other Ambulatory Visit: Payer: Self-pay

## 2019-11-01 VITALS — BP 120/82 | HR 72 | Ht 73.0 in | Wt 303.0 lb

## 2019-11-01 DIAGNOSIS — E1159 Type 2 diabetes mellitus with other circulatory complications: Secondary | ICD-10-CM

## 2019-11-01 DIAGNOSIS — E1165 Type 2 diabetes mellitus with hyperglycemia: Secondary | ICD-10-CM

## 2019-11-01 MED ORDER — OZEMPIC (0.25 OR 0.5 MG/DOSE) 2 MG/1.5ML ~~LOC~~ SOPN: 0.5000 mg | PEN_INJECTOR | SUBCUTANEOUS | 5 refills | Status: DC

## 2019-11-01 MED ORDER — INSULIN ASPART 100 UNIT/ML FLEXPEN: 8.0000 [IU] | PEN_INJECTOR | Freq: Three times a day (TID) | SUBCUTANEOUS | 3 refills | Status: DC

## 2019-11-01 MED ORDER — FREESTYLE LIBRE 14 DAY READER DEVI: 1.0000 | Freq: Once | 0 refills | Status: AC

## 2019-11-01 MED ORDER — TRESIBA FLEXTOUCH 200 UNIT/ML ~~LOC~~ SOPN: 66.0000 [IU] | PEN_INJECTOR | Freq: Every day | SUBCUTANEOUS | 3 refills | Status: DC

## 2019-11-01 NOTE — Progress Notes (Signed)
Patient ID: [name], male   DOB: Jun 21, 1955, 64 y.o.   MRN: [id number]   This visit occurred during the SARS-CoV-2 public health emergency.  Safety protocols were in place, including screening questions prior to the visit, additional usage of staff PPE, and extensive cleaning of exam room while observing appropriate contact time as indicated for disinfecting solutions.   HPI: [name] is a 64 y.o.-year-old male, referred by his PCP, Dr. Sharlet Salina, for management of DM2, dx in 2017, insulin-dependent, uncontrolled, with complications (CHF, Afib/AFlutter, CKD, h/o calf diabetic ulcer).  Last summer, he had Covid19 >> developed AKI, was hospitalized - slowly recovered.  He was recently admitted in 08/2019 with hyperosmolar hyperglycemic nonketotic state, and was found to have a glucose of 1186, along with hyponatremia and AKI.  An HbA1c level was very high.  Reviewed HbA1c: Lab Results  Component Value Date   HGBA1C 14.2 (H) 09/04/2019   HGBA1C 15.0 (H) 09/03/2019   HGBA1C 6.9 (H) 03/08/2019   HGBA1C 6.7 (H) 01/31/2019   HGBA1C 6.1 11/26/2017   HGBA1C 6.2 09/09/2016   HGBA1C 9.5 (H) 08/31/2015   HGBA1C 6.0 12/13/2014   Pt is on a regimen of: - Levemir 40 units 2x a day - Novolog 5 units + SSI 3x a day, before meals: BG 70-120: 0 Units, BG 121-150:3 units, BG 151-200:4 Units, BG 201-250:7 units BG 251-300: 11 Units, 301-350 : 15 units. If BG >350: Call your primary care doctor He was on Glipizide since 2017.  He has a freestyle libre CGM but this is not attached yet.  Pt checks his sugars  5x a day and they are: - am: 71, 138, 145-174, 191 - 2h after b'fast: n/c - before lunch:  114-168, 171 - 2h after lunch: n/c - before dinner: 191-178, 195 - 2h after dinner: n/c - bedtime: n/c - nighttime: n/c Lowest sugar was 71; he has hypoglycemia awareness at 70.  Highest sugar was 1186, then 330.  Glucometer: [facility]   Pt's meals are: - Breakfast: fruit - Lunch: salad -  Dinner: salad, chili, hamburger patty, grilled chicken - Snacks:- He saw nutrition in 2016  - + CKD, last BUN/creatinine:  Lab Results  Component Value Date   BUN 16 09/26/2019   BUN 16 09/07/2019   CREATININE 1.48 09/26/2019   CREATININE 1.40 (H) 09/07/2019  On Cozaar 50.  - + HL; last set of lipids: Lab Results  Component Value Date   CHOL 136 03/08/2019   HDL 64.10 03/08/2019   LDLCALC 61 03/08/2019   LDLDIRECT 81.0 08/31/2015   TRIG 58.0 03/08/2019   CHOLHDL 2 03/08/2019  On Crestor 10.  - last eye exam was in 10/2019: No DR.   - no numbness and tingling in his feet. Has a h.o R calf ulcer - he required skin graft - 2020  Pt has no FH of DM.  He also has a history of hypothyroidism and is on levothyroxine.  Latest TSH was very high as he was out for 2 weeks: Lab Results  Component Value Date   TSH 33.362 (H) 09/03/2019   TSH 13.910 (H) 02/02/2019   TSH 25.834 (H) 01/31/2019   TSH 4.800 (H) 05/03/2018   TSH 3.39 11/26/2017   TSH 3.510 03/18/2017   TSH 3.51 08/31/2015   TSH 3.49 10/07/2013   TSH 8.38 (H) 04/26/2010   TSH 55.23 (H) 02/06/2010   Pt is on levothyroxine 50 mcg daily, taken: - in am - fasting - 5 min later:  coffee + creamer! - at least 30 min from b'fast (if has bfast) - no Ca, Fe,  PPIs - + MVI along with LT4! - not on Biotin  She also has a history of HTN, OSA.  He drinks 4-5 beers every day.  Previously was drinking hard liquor.  ROS: Constitutional: no weight gain, no weight loss, no fatigue, no subjective hyperthermia, no subjective hypothermia, no nocturia Eyes: no blurry vision, no xerophthalmia ENT: no sore throat, no nodules palpated in neck, no dysphagia, no odynophagia, no hoarseness, no tinnitus, no hypoacusis Cardiovascular: no CP, no SOB, no palpitations, no leg swelling Respiratory: no cough, no SOB, no wheezing Gastrointestinal: no N, no V, no D, no C, no acid reflux Musculoskeletal: no muscle, + joint aches Skin: no rash,  no hair loss, + easy bruising Neurological: no tremors, no numbness or tingling/no dizziness/no HAs Psychiatric: no depression, no anxiety  Past Medical History:  Diagnosis Date  . Asthma    as a teenager - does not use an inhaler although pt said he has an albuteral inhaler  . Atrial fibrillation ([place])    persistant 02/2009  . Atrial flutter ([place])    s/p CTI ablation 08/06/10  . Cataract   . Chronic rhinitis   . Colon polyp   . Congestive heart failure ([place])   . Diverticulosis    colonoscopy 04/03/2009  . Hyperlipidemia   . Hypertension   . Morbid obesity ([place])    target weight = 219  for BMI < 30  . Nonischemic cardiomyopathy ([facility])    tachycardia mediated  . Seasonal allergies   . Sleep apnea    original 2001 - wears C-PaP  . Type 2 diabetes mellitus ([place])    Past Surgical History:  Procedure Laterality Date  . ATRIAL FIBRILLATION ABLATION N/A 12/14/2018   Procedure: ATRIAL FIBRILLATION ABLATION;  Surgeon: Thompson Grayer, MD;  Location: [facility];  Service: Cardiovascular;  Laterality: N/A;  . atrial flutter ablation  08/06/10  . CARDIOVERSION N/A 07/05/2018   Procedure: CARDIOVERSION;  Surgeon: Buford Dresser, MD;  Location: [facility];  Service: Cardiovascular;  Laterality: N/A;  . COLONOSCOPY    . POLYPECTOMY    . TEE WITHOUT CARDIOVERSION N/A 12/14/2018   Procedure: TRANSESOPHAGEAL ECHOCARDIOGRAM (TEE);  Surgeon: Thompson Grayer, MD;  Location: [facility];  Service: Cardiovascular;  Laterality: N/A;   Social History   Socioeconomic History  . Marital status: Single    Spouse name: Not on file  . Number of children: 0  . Years of education: Not on file  . Highest education level: Not on file  Occupational History  . Occupation: Sales  Tobacco Use  . Smoking status: Never Smoker  . Smokeless tobacco: Never Used  Substance and Sexual Activity  . Alcohol use: Yes    Alcohol/week: 21.0 standard drinks    Types: 21 Cans of beer per week     Comment: previously quite heavy, working on cessation   . Drug use: No  . Sexual activity: Not on file  Other Topics Concern  . Not on file  Social History Narrative   Lives in [place] and works for [employer]   Social Determinants of Health   Financial Resource Strain:   . Difficulty of Paying Living Expenses:   Food Insecurity:   . Worried About Charity fundraiser in the Last Year:   . [place] in the Last Year:   Transportation Needs:   . Lack of  Transportation (Medical):   [name] Lack of Transportation (Non-Medical):   Physical Activity:   . Days of Exercise per Week:   . Minutes of Exercise per Session:   Stress:   . Feeling of Stress :   Social Connections:   . Frequency of Communication with Friends and Family:   . Frequency of Social Gatherings with Friends and Family:   . Attends Religious Services:   . Active Member of Clubs or Organizations:   . Attends Archivist Meetings:   [name] Marital Status:   Intimate Partner Violence:   . Fear of Current or Ex-Partner:   . Emotionally Abused:   [name] Physically Abused:   . Sexually Abused:    Current Outpatient Medications on File Prior to Visit  Medication Sig Dispense Refill  . albuterol (PROVENTIL HFA;VENTOLIN HFA) 108 (90 Base) MCG/ACT inhaler Inhale 2 puffs into the lungs every 6 (six) hours as needed for wheezing or shortness of breath.    . Continuous Blood Gluc Sensor (FREESTYLE LIBRE 14 DAY SENSOR) MISC Use as directed by Diabetes educator 2 each 11  . dabigatran (PRADAXA) 150 MG CAPS capsule Take 1 capsule (150 mg total) by mouth 2 (two) times daily. 180 capsule 3  . doxylamine, Sleep, (UNISOM) 25 MG tablet Take 50 mg by mouth at bedtime as needed for sleep.     . folic acid (FOLVITE) 1 MG tablet Take 1 tablet (1 mg total) by mouth daily. 30 tablet 1  . furosemide (LASIX) 40 MG tablet Take 1 tablet (40 mg total) by mouth daily as needed for fluid or edema (leg swellings/shortness of breath /weight gain  >5 punds in 48 hrs). 60 tablet 5  . gabapentin (NEURONTIN) 300 MG capsule TAKE 1 CAPSULE BY MOUTH THREE TIMES A DAY (Patient taking differently: Take 600 mg by mouth in the morning and at bedtime. ) 270 capsule 2  . insulin aspart (NOVOLOG FLEXPEN) 100 UNIT/ML FlexPen Per sliding scale as needed four times daily BG 70-120: 0 Units, BG 121-150:3 units, BG 151-200:4 Units, BG 201-250:7 units BG 251-300: 11 Units, 301-350 : 15 units. If BG >350: Call your primary care doctor 15 mL 11  . insulin aspart (NOVOLOG) 100 UNIT/ML FlexPen Inject 5 Units into the skin 3 (three) times daily with meals. 15 mL 11  . insulin detemir (LEVEMIR) 100 UNIT/ML FlexPen Inject 40 Units into the skin 2 (two) times daily. 15 mL 11  . Insulin Pen Needle (PEN NEEDLES) 32G X 5 MM MISC 1 application by Does not apply route 4 (four) times daily -  with meals and at bedtime. 100 each 4  . levothyroxine (SYNTHROID) 50 MCG tablet Take 1 tablet (50 mcg total) by mouth daily before breakfast. 30 tablet 1  . losartan (COZAAR) 25 MG tablet Take 0.5 tablets (12.5 mg total) by mouth every morning. 30 tablet 1  . mometasone (NASONEX) 50 MCG/ACT nasal spray Place 2 sprays into the nose daily as needed (allergies or rhinitis).     . rosuvastatin (CRESTOR) 10 MG tablet TAKE 1 TABLET BY MOUTH EVERY DAY (Patient taking differently: Take 10 mg by mouth at bedtime. ) 90 tablet 3  . spironolactone (ALDACTONE) 25 MG tablet Take 25 mg by mouth in the morning.     . SYMBICORT 80-4.5 MCG/ACT inhaler USE 2 INHALATIONS TWICE A DAY (Patient taking differently: Inhale 2 puffs into the lungs 2 (two) times daily as needed (for flares). ) 30.6 g 1  . thiamine 100 MG tablet Take  1 tablet (100 mg total) by mouth daily. 30 tablet 1  . traMADol (ULTRAM) 50 MG tablet Take 50 mg by mouth every 6 (six) hours as needed (for pain).     . vitamin C (ASCORBIC ACID) 500 MG tablet Take 500-1,000 mg by mouth daily.     No current facility-administered medications on  file prior to visit.   No Known Allergies Family History  Problem Relation Age of Onset  . Hypertension Mother   . Kidney failure Mother        Due to sepsis  . COPD Father   . Colon cancer Brother        dx in 20's  . Esophageal cancer Neg Hx   . Rectal cancer Neg Hx   . Stomach cancer Neg Hx     PE: BP 120/82   Pulse 72   Ht 6\' 1"  (1.854 m)   Wt (!) 303 lb (137.4 kg)   SpO2 98%   BMI 39.98 kg/m  Wt Readings from Last 3 Encounters:  11/01/19 (!) 303 lb (137.4 kg)  10/13/19 294 lb (133.4 kg)  09/26/19 297 lb (134.7 kg)   Constitutional: overweight, in NAD Eyes: PERRLA, EOMI, no exophthalmos ENT: moist mucous membranes, no thyromegaly, no cervical lymphadenopathy Cardiovascular: RRR, No MRG Respiratory: CTA B Gastrointestinal: abdomen soft, NT, ND, BS+ Musculoskeletal: no deformities, strength intact in all 4 Skin: moist, warm, no rashes Neurological: no tremor with outstretched hands, DTR normal in all 4  ASSESSMENT: 1. DM2, insulin-dependent, uncontrolled, with complications - CHF - A. fib/a flutter - CKD - h/o Diabetic ulcer of calf  2.  Hypothyroidism  PLAN:  1. Patient with long-standing, uncontrolled diabetes, previously controlled, but recently admitted to the hospital (2 months ago) with HHNK state with a glucose over 1000.  He was started on basal-bolus insulin regimen, which he continues today.  Sugars have improved significantly, but they are still above target. -We reviewed his insulin regimen.  He is using a high dose of basal insulin and small doses of mealtime insulin.  We discussed that ideally the total daily dose of Levemir would be called a total daily dose of NovoLog.  Therefore, we will decrease the dose of long-acting insulin and I also suggested to change to [place] U200 which is longer acting and more concentrated so he can only take 1 dose a day.  This appears to be covered by his insurance.  I will also increase his NovoLog slightly since he  ends up taking at least 9 units per meal.  In the meantime, we also discussed about adding Ozempic, starting at a low dose and increasing as tolerated.  Discussed about benefits and possible side effects.  I am hoping that we can reduce and even stop NovoLog while on the Ozempic.  Of note, he has no personal history of pancreatitis or family history of medullary thyroid cancer. -I referred him to diabetes education/nutrition to help him with the CGM -He did see the nutritionist in the hospital -At this visit I also advised him to try to cut down hopefully even stop on his beer drinking.  He is working towards this. - I suggested to:  Patient Instructions  Please start: - Ozempic 0.25 mg weekly in a.m. (for example on Sunday morning) x 4 weeks, then increase to 0.5 mg weekly in a.m. if no nausea or hypoglycemia.  Please change Levemir to: - Tresiba U200 66 units daily  Change: - Novolog 8-12 units based on the size  of the meal  Please schedule an appt with nutrition to attach the CGM.  Please continue Levothyroxine 50 mcg daily.  Take the thyroid hormone every day, with water, at least 30 minutes before breakfast, separated by at least 4 hours from: - acid reflux medications - calcium - iron - multivitamins  Move Multivitamins >4h after Levothyroxine.  Move coffee >30 min after Levothyroxine or, better, skip creamer.  Please return in 1.5 months with your sugar log.   - Strongly advised him to start checking sugars at different times of the day - check 4x a day, rotating checks - discussed about CBG targets for treatment: 80-130 mg/dL before meals and <180 mg/dL after meals; target HbA1c <7%. - given sugar log and advised how to fill it and to bring it at next appt  - given foot care handout and explained the principles  - given instructions for hypoglycemia management "15-15 rule"  - advised for yearly eye exams  - Return to clinic in 3 mo with sugar log   2.   Hypothyroidism -Uncontrolled - latest thyroid labs reviewed with pt >> very high, and this was also fluctuating in the past: Lab Results  Component Value Date   TSH 33.362 (H) 09/03/2019   - he continues on LT4 50 mcg daily - we discussed about taking the thyroid hormone every day, with water, >30 minutes before breakfast, separated by >4 hours from acid reflux medications, calcium, iron, multivitamins. Pt. is taking it incorrectly, taking multivitamins along with levothyroxine (advised him to move these at least 4 hours later), he also drinks coffee with creamer along with levothyroxine and I advised him to either skip the creamer or to move this more than 30 minutes later. - will check thyroid tests at next visit  Philemon Kingdom, MD PhD [facility]

## 2019-11-01 NOTE — Patient Instructions (Addendum)
Please start: - Ozempic 0.25 mg weekly in a.m. (for example on Sunday morning) x 4 weeks, then increase to 0.5 mg weekly in a.m. if no nausea or hypoglycemia.  Please change Levemir to: - Tresiba U200 66 units daily  Change: - Novolog 8-12 units based on the size of the meal  Please schedule an appt with nutrition to attach the CGM.  Please continue Levothyroxine 50 mcg daily.  Take the thyroid hormone every day, with water, at least 30 minutes before breakfast, separated by at least 4 hours from: - acid reflux medications - calcium - iron - multivitamins  Move Multivitamins >4h after Levothyroxine.  Move coffee >30 min after Levothyroxine or, better, skip creamer.  Please return in 1.5 months with your sugar log.   PATIENT INSTRUCTIONS FOR TYPE 2 DIABETES:  DIET AND EXERCISE Diet and exercise is an important part of diabetic treatment.  We recommended aerobic exercise in the form of brisk walking (working between 40-60% of maximal aerobic capacity, similar to brisk walking) for 150 minutes per week (such as 30 minutes five days per week) along with 3 times per week performing 'resistance' training (using various gauge rubber tubes with handles) 5-10 exercises involving the major muscle groups (upper body, lower body and core) performing 10-15 repetitions (or near fatigue) each exercise. Start at half the above goal but build slowly to reach the above goals. If limited by weight, joint pain, or disability, we recommend daily walking in a swimming pool with water up to waist to reduce pressure from joints while allow for adequate exercise.    BLOOD GLUCOSES Monitoring your blood glucoses is important for continued management of your diabetes. Please check your blood glucoses 2-4 times a day: fasting, before meals and at bedtime (you can rotate these measurements - e.g. one day check before the 3 meals, the next day check before 2 of the meals and before bedtime, etc.).   HYPOGLYCEMIA  (low blood sugar) Hypoglycemia is usually a reaction to not eating, exercising, or taking too much insulin/ other diabetes drugs.  Symptoms include tremors, sweating, hunger, confusion, headache, etc. Treat IMMEDIATELY with 15 grams of Carbs: . 4 glucose tablets .  cup regular juice/soda . 2 tablespoons raisins . 4 teaspoons sugar . 1 tablespoon honey Recheck blood glucose in 15 mins and repeat above if still symptomatic/blood glucose <100.  RECOMMENDATIONS TO REDUCE YOUR RISK OF DIABETIC COMPLICATIONS: * Take your prescribed MEDICATION(S) * Follow a DIABETIC diet: Complex carbs, fiber rich foods, (monounsaturated and polyunsaturated) fats * AVOID saturated/trans fats, high fat foods, >2,300 mg salt per day. * EXERCISE at least 5 times a week for 30 minutes or preferably daily.  * DO NOT SMOKE OR DRINK more than 1 drink a day. * Check your FEET every day. Do not wear tightfitting shoes. Contact us if you develop an ulcer * See your EYE doctor once a year or more if needed * Get a FLU shot once a year * Get a PNEUMONIA vaccine once before and once after age 6 years  GOALS:  * Your Hemoglobin A1c of <7%  * fasting sugars need to be <130 * after meals sugars need to be <180 (2h after you start eating) * Your Systolic BP should be [id number] or lower  * Your Diastolic BP should be 80 or lower  * Your HDL (Good Cholesterol) should be 40 or higher  * Your LDL (Bad Cholesterol) should be 100 or lower. * Your Triglycerides should be 150 or  lower  * Your Urine microalbumin (kidney function) should be <30 * Your Body Mass Index should be 25 or lower    Please consider the following ways to cut down carbs and fat and increase fiber and micronutrients in your diet: - substitute whole grain for white bread or pasta - substitute brown rice for white rice - substitute 90-calorie flat bread pieces for slices of bread when possible - substitute sweet potatoes or yams for white potatoes - substitute  humus for margarine - substitute tofu for cheese when possible - substitute almond or rice milk for regular milk (would not drink soy milk daily due to concern for soy estrogen influence on breast cancer risk) - substitute dark chocolate for other sweets when possible - substitute water - can add lemon or orange slices for taste - for diet sodas (artificial sweeteners will trick your body that you can eat sweets without getting calories and will lead you to overeating and weight gain in the long run) - do not skip breakfast or other meals (this will slow down the metabolism and will result in more weight gain over time)  - can try smoothies made from fruit and almond/rice milk in am instead of regular breakfast - can also try old-fashioned (not instant) oatmeal made with almond/rice milk in am - order the dressing on the side when eating salad at a restaurant (pour less than half of the dressing on the salad) - eat as little meat as possible - can try juicing, but should not forget that juicing will get rid of the fiber, so would alternate with eating raw veg./fruits or drinking smoothies - use as little oil as possible, even when using olive oil - can dress a salad with a mix of balsamic vinegar and lemon juice, for e.g. - use agave nectar, stevia sugar, or regular sugar rather than artificial sweateners - steam or broil/roast veggies  - snack on veggies/fruit/nuts (unsalted, preferably) when possible, rather than processed foods - reduce or eliminate aspartame in diet (it is in diet sodas, chewing gum, etc) Read the labels!  Try to read Dr. Janene Harvey book: "Program for Reversing Diabetes" for other ideas for healthy eating.

## 2019-11-16 ENCOUNTER — Encounter: Payer: BLUE CROSS/BLUE SHIELD | Attending: Internal Medicine | Admitting: Nutrition

## 2019-11-16 ENCOUNTER — Other Ambulatory Visit: Payer: Self-pay

## 2019-11-16 DIAGNOSIS — E1169 Type 2 diabetes mellitus with other specified complication: Secondary | ICD-10-CM | POA: Diagnosis present

## 2019-11-16 DIAGNOSIS — E785 Hyperlipidemia, unspecified: Secondary | ICD-10-CM | POA: Insufficient documentation

## 2019-11-16 NOTE — Patient Instructions (Signed)
Replace sensor every 14 days. Call 800 help line if questions.

## 2019-11-16 NOTE — Progress Notes (Signed)
We discussed the difference between sensor readings and blood sugar readings.  He was shown how to attach a sensor, how to scan it and how to remove it.  He attached the sensor to his left upper outer arm without difficulty.  We Set up his reader to be his phone, and it was linked to the practice. He had no final questions.  He was given my card to call if questions during my business hours or the 800 help line if questions on weekends or after hours.

## 2019-11-17 ENCOUNTER — Other Ambulatory Visit: Payer: Self-pay | Admitting: Internal Medicine

## 2019-12-09 ENCOUNTER — Other Ambulatory Visit: Payer: Self-pay | Admitting: Internal Medicine

## 2019-12-14 ENCOUNTER — Encounter: Payer: Self-pay | Admitting: Internal Medicine

## 2019-12-14 ENCOUNTER — Other Ambulatory Visit: Payer: Self-pay

## 2019-12-14 ENCOUNTER — Ambulatory Visit: Payer: BLUE CROSS/BLUE SHIELD | Admitting: Internal Medicine

## 2019-12-14 VITALS — BP 144/90 | HR 61 | Ht 73.0 in | Wt 300.2 lb

## 2019-12-14 DIAGNOSIS — D6869 Other thrombophilia: Secondary | ICD-10-CM | POA: Diagnosis not present

## 2019-12-14 DIAGNOSIS — I4819 Other persistent atrial fibrillation: Secondary | ICD-10-CM

## 2019-12-14 DIAGNOSIS — I428 Other cardiomyopathies: Secondary | ICD-10-CM

## 2019-12-14 DIAGNOSIS — I519 Heart disease, unspecified: Secondary | ICD-10-CM

## 2019-12-14 DIAGNOSIS — I480 Paroxysmal atrial fibrillation: Secondary | ICD-10-CM

## 2019-12-14 NOTE — Patient Instructions (Addendum)
Medication Instructions:  Your physician recommends that you continue on your current medications as directed. Please refer to the Current Medication list given to you today.  *If you need a refill on your cardiac medications before your next appointment, please call your pharmacy*  Lab Work: None ordered.  If you have labs (blood work) drawn today and your tests are completely normal, you will receive your results only by: [name] MyChart Message (if you have MyChart) OR . A paper copy in the mail If you have any lab test that is abnormal or we need to change your treatment, we will call you to review the results.  Testing/Procedures: None ordered.  Follow-Up: At [facility], you and your health needs are our priority.  As part of our continuing mission to provide you with exceptional heart care, we have created designated Provider Care Teams.  These Care Teams include your primary Cardiologist (physician) and Advanced Practice Providers (APPs -  Physician Assistants and Nurse Practitioners) who all work together to provide you with the care you need, when you need it.  We recommend signing up for the patient portal called "MyChart".  Sign up information is provided on this After Visit Summary.  MyChart is used to connect with patients for Virtual Visits (Telemedicine).  Patients are able to view lab/test results, encounter notes, upcoming appointments, etc.  Non-urgent messages can be sent to your provider as well.   To learn more about what you can do with MyChart, go to [email or web address].    Your next appointment:    Your physician wants you to follow-up in: 6 months with Tommye Standard, PA.  You will receive a reminder letter in the mail two months in advance. If you don't receive a letter, please call our office to schedule the follow-up appointment.  Your physician wants you to follow-up in: 1 year with Dr. Rayann Heman. You will receive a reminder letter in the mail two months in advance.  If you don't receive a letter, please call our office to schedule the follow-up appointment.    Other Instructions:

## 2019-12-14 NOTE — Progress Notes (Signed)
PCP: [name], MD Primary Cardiologist: Dr Harrell Gave Primary EP: Dr Rayann Heman  [name] is a 64 y.o. male who presents today for routine electrophysiology followup.  Since last being seen in our clinic, the patient reports doing very well.  Today, he denies symptoms of palpitations, chest pain, shortness of breath,  lower extremity edema, dizziness, presyncope, or syncope.  The patient is otherwise without complaint today.   Past Medical History:  Diagnosis Date  . Asthma    as a teenager - does not use an inhaler although pt said he has an albuteral inhaler  . Atrial fibrillation ([place])    persistant 02/2009  . Atrial flutter ([place])    s/p CTI ablation 08/06/10  . Cataract   . Chronic rhinitis   . Colon polyp   . Congestive heart failure ([place])   . Diverticulosis    colonoscopy 04/03/2009  . Hyperlipidemia   . Hypertension   . Morbid obesity ([place])    target weight = 219  for BMI < 30  . Nonischemic cardiomyopathy (HCC)    tachycardia mediated  . Seasonal allergies   . Sleep apnea    original 2001 - wears C-PaP  . Type 2 diabetes mellitus ([place])    Past Surgical History:  Procedure Laterality Date  . ATRIAL FIBRILLATION ABLATION N/A 12/14/2018   Procedure: ATRIAL FIBRILLATION ABLATION;  Surgeon: Thompson Grayer, MD;  Location: [place] [facility];  Service: Cardiovascular;  Laterality: N/A;  . atrial flutter ablation  08/06/10  . CARDIOVERSION N/A 07/05/2018   Procedure: CARDIOVERSION;  Surgeon: Buford Dresser, MD;  Location: [facility] ENDOSCOPY;  Service: Cardiovascular;  Laterality: N/A;  . COLONOSCOPY    . POLYPECTOMY    . TEE WITHOUT CARDIOVERSION N/A 12/14/2018   Procedure: TRANSESOPHAGEAL ECHOCARDIOGRAM (TEE);  Surgeon: Thompson Grayer, MD;  Location: [place] [facility];  Service: Cardiovascular;  Laterality: N/A;    ROS- all systems are reviewed and negatives except as per HPI above  Current Outpatient Medications  Medication Sig Dispense Refill  .  albuterol (PROVENTIL HFA;VENTOLIN HFA) 108 (90 Base) MCG/ACT inhaler Inhale 2 puffs into the lungs every 6 (six) hours as needed for wheezing or shortness of breath.    . Continuous Blood Gluc Sensor (FREESTYLE LIBRE 14 DAY SENSOR) MISC Use as directed by Diabetes educator 2 each 11  . dabigatran (PRADAXA) 150 MG CAPS capsule Take 1 capsule (150 mg total) by mouth 2 (two) times daily. 180 capsule 3  . doxylamine, Sleep, (UNISOM) 25 MG tablet Take 50 mg by mouth at bedtime as needed for sleep.     . folic acid (FOLVITE) 1 MG tablet Take 1 tablet (1 mg total) by mouth daily. 30 tablet 1  . furosemide (LASIX) 40 MG tablet Take 1 tablet (40 mg total) by mouth daily as needed for fluid or edema (leg swellings/shortness of breath /weight gain >5 punds in 48 hrs). 60 tablet 5  . gabapentin (NEURONTIN) 300 MG capsule TAKE 1 CAPSULE BY MOUTH THREE TIMES A DAY 270 capsule 2  . insulin aspart (NOVOLOG) 100 UNIT/ML FlexPen Inject 8-12 Units into the skin 3 (three) times daily with meals. 30 mL 3  . insulin degludec (TRESIBA FLEXTOUCH) 200 UNIT/ML FlexTouch Pen Inject 66 Units into the skin daily. 9 pen 3  . levothyroxine (SYNTHROID) 50 MCG tablet Take 1 tablet (50 mcg total) by mouth daily before breakfast. 30 tablet 1  . losartan (COZAAR) 25 MG tablet Take 0.5 tablets (12.5 mg total) by mouth  every morning. 30 tablet 1  . mometasone (NASONEX) 50 MCG/ACT nasal spray Place 2 sprays into the nose daily as needed (allergies or rhinitis).     . rosuvastatin (CRESTOR) 10 MG tablet TAKE 1 TABLET BY MOUTH EVERY DAY (Patient taking differently: Take 10 mg by mouth at bedtime. ) 90 tablet 3  . Semaglutide,0.25 or 0.5MG /DOS, (OZEMPIC, 0.25 OR 0.5 MG/DOSE,) 2 MG/1.5ML SOPN Inject 0.5 mg into the skin once a week. 2 pen 5  . spironolactone (ALDACTONE) 25 MG tablet TAKE 1/2 TABLET BY MOUTH EVERY DAY 45 tablet 3  . SYMBICORT 80-4.5 MCG/ACT inhaler USE 2 INHALATIONS TWICE A DAY (Patient taking differently: Inhale 2 puffs into  the lungs 2 (two) times daily as needed (for flares). ) 30.6 g 1  . thiamine 100 MG tablet Take 1 tablet (100 mg total) by mouth daily. 30 tablet 1  . traMADol (ULTRAM) 50 MG tablet Take 50 mg by mouth every 6 (six) hours as needed (for pain).     . vitamin C (ASCORBIC ACID) 500 MG tablet Take 500-1,000 mg by mouth daily.     No current facility-administered medications for this visit.    Physical Exam: Vitals:   12/14/19 1101  BP: (!) 144/90  Pulse: 61  SpO2: 96%  Weight: (!) 300 lb 3.2 oz (136.2 kg)  Height: 6\' 1"  (1.854 m)    GEN- The patient is well appearing, alert and oriented x 3 today.   Head- normocephalic, atraumatic Eyes-  Sclera clear, conjunctiva pink Ears- hearing intact Oropharynx- clear Lungs- normal work of breathing Heart- Regular rate and rhythm  GI- soft  Extremities- no clubbing, cyanosis, or edema  Wt Readings from Last 3 Encounters:  12/14/19 (!) 300 lb 3.2 oz (136.2 kg)  11/01/19 (!) 303 lb (137.4 kg)  10/13/19 294 lb (133.4 kg)    EKG tracing ordered today is personally reviewed and shows sinus rhythm, PR 216 msec, LAHB  Assessment and Plan:  1. Persistent afib He is doing amazingly well off of AAD therapy Thinks he has been in sinus this last year He is on pradaxa. We had planned prior PVI but he had LAA thrombus and the procedure was cancelled. I advised that we could revisit ablation however he is clear in his decision to continue current therapy for now.  2. Obesity Lifestyle modification advised  3. Nonischemic CM/ chronic systolic dysfunction Followed by Dr Harrell Gave euvolemic today Labs reviewed No changes at this time  4. ETOH Avoidance encouraged He is drinking too much (3-4 beers per day at least)  5. OSA Compliance with CPAP is advised  Risks, benefits and potential toxicities for medications prescribed and/or refilled reviewed with patient today.   6 months with Joseph Art  I will see in a year  Thompson Grayer MD,  [facility] 12/14/2019 11:39 AM

## 2019-12-20 ENCOUNTER — Other Ambulatory Visit: Payer: Self-pay | Admitting: Internal Medicine

## 2019-12-23 ENCOUNTER — Encounter: Payer: Self-pay | Admitting: Internal Medicine

## 2019-12-26 ENCOUNTER — Other Ambulatory Visit: Payer: Self-pay

## 2019-12-26 MED ORDER — FREESTYLE LIBRE 14 DAY SENSOR MISC: 11 refills | Status: DC

## 2020-01-11 ENCOUNTER — Other Ambulatory Visit: Payer: Self-pay | Admitting: Internal Medicine

## 2020-01-13 ENCOUNTER — Other Ambulatory Visit: Payer: Self-pay | Admitting: Internal Medicine

## 2020-01-25 ENCOUNTER — Ambulatory Visit: Payer: Medicare HMO | Admitting: Cardiology

## 2020-01-25 ENCOUNTER — Encounter: Payer: Self-pay | Admitting: Cardiology

## 2020-01-25 ENCOUNTER — Other Ambulatory Visit: Payer: Self-pay

## 2020-01-25 VITALS — BP 112/82 | HR 65 | Ht 73.0 in | Wt 298.4 lb

## 2020-01-25 DIAGNOSIS — I428 Other cardiomyopathies: Secondary | ICD-10-CM | POA: Diagnosis not present

## 2020-01-25 DIAGNOSIS — D6869 Other thrombophilia: Secondary | ICD-10-CM | POA: Diagnosis not present

## 2020-01-25 DIAGNOSIS — I519 Heart disease, unspecified: Secondary | ICD-10-CM

## 2020-01-25 DIAGNOSIS — Z7189 Other specified counseling: Secondary | ICD-10-CM

## 2020-01-25 DIAGNOSIS — I480 Paroxysmal atrial fibrillation: Secondary | ICD-10-CM | POA: Diagnosis not present

## 2020-01-25 DIAGNOSIS — E785 Hyperlipidemia, unspecified: Secondary | ICD-10-CM | POA: Diagnosis not present

## 2020-01-25 DIAGNOSIS — I10 Essential (primary) hypertension: Secondary | ICD-10-CM

## 2020-01-25 DIAGNOSIS — E1169 Type 2 diabetes mellitus with other specified complication: Secondary | ICD-10-CM

## 2020-01-25 DIAGNOSIS — I5022 Chronic systolic (congestive) heart failure: Secondary | ICD-10-CM | POA: Diagnosis not present

## 2020-01-25 MED ORDER — FUROSEMIDE 40 MG PO TABS: 40.0000 mg | ORAL_TABLET | Freq: Every day | ORAL | 3 refills | Status: DC

## 2020-01-25 NOTE — Patient Instructions (Signed)
Medication Instructions:  The current medical regimen is effective;  continue present plan and medications.  *If you need a refill on your cardiac medications before your next appointment, please call your pharmacy*  Follow-Up: At [facility], you and your health needs are our priority.  As part of our continuing mission to provide you with exceptional heart care, we have created designated Provider Care Teams.  These Care Teams include your primary Cardiologist (physician) and Advanced Practice Providers (APPs -  Physician Assistants and Nurse Practitioners) who all work together to provide you with the care you need, when you need it.  We recommend signing up for the patient portal called "MyChart".  Sign up information is provided on this After Visit Summary.  MyChart is used to connect with patients for Virtual Visits (Telemedicine).  Patients are able to view lab/test results, encounter notes, upcoming appointments, etc.  Non-urgent messages can be sent to your provider as well.   To learn more about what you can do with MyChart, go to [email or web address].    Your next appointment:   6 month(s)  The format for your next appointment:   In Person  Provider:   Bridgette Christopher, MD     

## 2020-01-25 NOTE — Progress Notes (Signed)
Cardiology Office Note:    Date:  01/25/2020   ID:  [name], DOB [birth year], MRN [id number]  PCP:  [name], MD  Cardiologist:  Buford Dresser, MD PhD  Referring MD: [name], *   CC: follow up  History of Present Illness:    [name] is a 64 y.o. male with a hx of persistent atrial fibrillation, atrial flutter with prior ablation in 2012, prior cardioversions, HTN, chronic systolic heart failure (thought to be tachycardia induced) with EF 30-35% on tte 04/2018, OSA who is seen in follow up today. I first saw him as a new consult 12/28/18.  Cardiac history: 12/14/18 Was planned for afib ablation. However, TEE performed was concerning for thrombus. Also noted to have EF 30%, severe biatrial enlargement. 04/2018: TTE, EF 30-35%, diffuse hypokinesis, biatrial enlargement  Today: Doing well overall. Has been golfing. Legs swelling well controlled, diabetes and blood pressure well controlled.   We reviewed meds at length. Also discussed heart failure management, options, see below.  Reviewed diet. Aims for low carb, avoids pasta, white rice. Eats chicken, meat, eggs. Occasional bacon. Tries to eat fruits and vegetables.  Stays active, golfs 2-3 times/week, uses a cart.   Quit hard liquor several years ago. Drinks beer, 5-7 beers most days of the week, not interested in cutting back.  Denies chest pain, shortness of breath at rest or with normal exertion. No PND, orthopnea, LE edema or unexpected weight gain. No syncope or palpitations.  Past Medical History:  Diagnosis Date  . Asthma    as a teenager - does not use an inhaler although pt said he has an albuteral inhaler  . Atrial fibrillation ([place])    persistant 02/2009  . Atrial flutter ([place])    s/p CTI ablation 08/06/10  . Cataract   . Chronic rhinitis   . Colon polyp   . Congestive heart failure ([place])   . Diverticulosis    colonoscopy 04/03/2009  . Hyperlipidemia   .  Hypertension   . Morbid obesity ([place])    target weight = 219  for BMI < 30  . Nonischemic cardiomyopathy (HCC)    tachycardia mediated  . Seasonal allergies   . Sleep apnea    original 2001 - wears C-PaP  . Type 2 diabetes mellitus ([place])     Past Surgical History:  Procedure Laterality Date  . ATRIAL FIBRILLATION ABLATION N/A 12/14/2018   Procedure: ATRIAL FIBRILLATION ABLATION;  Surgeon: Thompson Grayer, MD;  Location: [place] [facility];  Service: Cardiovascular;  Laterality: N/A;  . atrial flutter ablation  08/06/10  . CARDIOVERSION N/A 07/05/2018   Procedure: CARDIOVERSION;  Surgeon: Buford Dresser, MD;  Location[facility]COPY;  Service: Cardiovascular;  Laterality: N/A;  . COLONOSCOPY    . POLYPECTOMY    . TEE WITHOUT CARDIOVERSION N/A 12/14/2018   Procedure: TRANSESOPHAGEAL ECHOCARDIOGRAM (TEE);  Surgeon: Thompson Grayer, MD;  Location: [place] [facility];  Service: Cardiovascular;  Laterality: N/A;    Current Medications: Current Outpatient Medications on File Prior to Visit  Medication Sig  . albuterol (PROVENTIL HFA;VENTOLIN HFA) 108 (90 Base) MCG/ACT inhaler Inhale 2 puffs into the lungs every 6 (six) hours as needed for wheezing or shortness of breath.  . Continuous Blood Gluc Receiver (FREESTYLE LIBRE 14 DAY READER) DEVI USE AS DIRECTED  . Continuous Blood Gluc Sensor (FREESTYLE LIBRE 14 DAY SENSOR) MISC Use as directed by Diabetes educator  . dabigatran (PRADAXA) 150 MG CAPS capsule Take 1 capsule (150  mg total) by mouth 2 (two) times daily.  [name] doxylamine, Sleep, (UNISOM) 25 MG tablet Take 50 mg by mouth at bedtime as needed for sleep.   . folic acid (FOLVITE) 1 MG tablet Take 1 tablet (1 mg total) by mouth daily.  . furosemide (LASIX) 40 MG tablet TAKE 1 TABLET TWICE DAILY (Patient taking differently: daily. )  . gabapentin (NEURONTIN) 300 MG capsule TAKE 1 CAPSULE BY MOUTH THREE TIMES A DAY  . insulin aspart (NOVOLOG) 100 UNIT/ML FlexPen Inject 8-12 Units into the  skin 3 (three) times daily with meals.  . insulin degludec (TRESIBA FLEXTOUCH) 200 UNIT/ML FlexTouch Pen Inject 66 Units into the skin daily.  . Insulin Pen Needle (B-D UF III MINI PEN NEEDLES) 31G X 5 MM MISC 1 each by Other route 4 (four) times daily. E11.9  . levothyroxine (SYNTHROID) 50 MCG tablet TAKE 1 TABLET BY MOUTH DAILY BEFORE BREAKFAST  . losartan (COZAAR) 25 MG tablet Take 0.5 tablets (12.5 mg total) by mouth every morning.  . mometasone (NASONEX) 50 MCG/ACT nasal spray Place 2 sprays into the nose daily as needed (allergies or rhinitis).   . rosuvastatin (CRESTOR) 10 MG tablet TAKE 1 TABLET BY MOUTH EVERY DAY (Patient taking differently: Take 10 mg by mouth at bedtime. )  . Semaglutide,0.25 or 0.5MG /DOS, (OZEMPIC, 0.25 OR 0.5 MG/DOSE,) 2 MG/1.5ML SOPN Inject 0.5 mg into the skin once a week.  . spironolactone (ALDACTONE) 25 MG tablet TAKE 1/2 TABLET BY MOUTH EVERY DAY  . SYMBICORT 80-4.5 MCG/ACT inhaler USE 2 INHALATIONS TWICE A DAY (Patient taking differently: Inhale 2 puffs into the lungs 2 (two) times daily as needed (for flares). )  . thiamine 100 MG tablet Take 1 tablet (100 mg total) by mouth daily.  . vitamin C (ASCORBIC ACID) 500 MG tablet Take 500-1,000 mg by mouth daily.   No current facility-administered medications on file prior to visit.     Allergies:   Patient has no known allergies.   Social History   Tobacco Use  . Smoking status: Never Smoker  . Smokeless tobacco: Never Used  Vaping Use  . Vaping Use: Never used  Substance Use Topics  . Alcohol use: Yes    Alcohol/week: 21.0 standard drinks    Types: 21 Cans of beer per week    Comment: previously quite heavy, working on cessation   . Drug use: No     Family History: The patient's family history includes COPD in his father; Colon cancer in his brother; Hypertension in his mother; Kidney failure in his mother. There is no history of Esophageal cancer, Rectal cancer, or Stomach cancer.  ROS:   Please  see the history of present illness.  Additional pertinent ROS otherwise unremarkable.  EKGs/Labs/Other Studies Reviewed:    The following studies were reviewed today: TTE 10-04-19 1. Left ventricular ejection fraction, by estimation, is 40 to 45%. The  left ventricle has mildly decreased function. The left ventricle  demonstrates global hypokinesis. There is severe left ventricular  hypertrophy, asymmetric and septal predominant  (parasternal views of the septum raise question of HOCM or infiltrative  cardiomyopathy - cardiac MRI could be considered if clinically  appropriate). Left ventricular diastolic parameters are consistent with  Grade I diastolic dysfunction (impaired  relaxation).  2. Right ventricular systolic function is normal. The right ventricular  size is normal. Tricuspid regurgitation signal is inadequate for assessing  PA pressure.  3. Left atrial size was mildly dilated.  4. The mitral valve is grossly  normal. Trivial mitral valve  regurgitation.  5. The aortic valve was not well visualized. Aortic valve regurgitation  is not visualized.  6. Aortic dilatation noted. There is mild dilatation of the ascending  aorta.  7. The inferior vena cava is normal in size with greater than 50%  respiratory variability, suggesting right atrial pressure of 3 mmHg.   Lexiscan 04/07/19  Nuclear stress EF: 49%.  The left ventricular ejection fraction is mildly decreased (45-54%).  No T wave inversion was noted during stress.  There was no ST segment deviation noted during stress.  Defect 1: There is a small defect of mild severity.  This is an intermediate risk study.   Small size, mild intensity fixed apical perfusion defect, likely thinning. No significant reversible ischemia. Dilated LV with mild hypokinesis - LVEF 49%. This is an intermediate risk study due to decreased LV function.  TTE 05/03/18 Left ventricle: The cavity size was normal. There was severe    concentric hypertrophy. Systolic function was moderately to   severely reduced. The estimated ejection fraction was in the   range of 30% to 35%. Diffuse hypokinesis. The study was not   technically sufficient to allow evaluation of LV diastolic   dysfunction due to atrial fibrillation. - Aortic valve: There was no regurgitation. - Aortic root: The aortic root was normal in size. - Mitral valve: There was trivial regurgitation. - Left atrium: The atrium was severely dilated. - Right ventricle: Systolic function was normal. - Right atrium: The atrium was moderately dilated. - Tricuspid valve: There was trivial regurgitation. - Pulmonic valve: Transvalvular velocity was within the normal   range. There was no evidence for stenosis. - Pulmonary arteries: Systolic pressure was within the normal   range. - Inferior vena cava: The vessel was normal in size. The   respirophasic diameter changes were in the normal range (= 50%),   consistent with normal central venous pressure. - Pericardium, extracardiac: There was no pericardial effusion.  Impressions: - When compared to the prior study from 04/02/2017 LVEF has   decreased from 55-60% to 30-35% with diffuse hypokinesis.  TEE 12/14/18  1. The left ventricle has moderate-severely reduced systolic function, with an ejection fraction of 30-35%. The cavity size was moderately dilated. Left ventrical global hypokinesis without regional wall motion abnormalities.  2. The right ventricle has normal systolc function. The cavity was normal. There is no increase in right ventricular wall thickness.  3. Left atrial size was severely dilated.  4. Dense smoke in atria and appendage. Stagnant pre thrombus and likely thrombus in mid to distal LAA High risk for embolic event EP procedure cancelled.  5. Right atrial size was moderately dilated.  6. The mitral valve is grossly normal. Mild thickening of the mitral valve leaflet. Mild calcification of the  mitral valve leaflet. Mitral valve regurgitation is mild to moderate by color flow Doppler.  7. Restricted posterior leaflet motion.  8. The aortic valve is tricuspid Moderate thickening of the aortic valve Sclerosis without any evidence of stenosis of the aortic valve. Aortic valve regurgitation is trivial by color flow Doppler.  9. The aortic root is normal in size and structure.  EKG:  EKG is personally reviewed.  The ekg ordered today demonstrates sinus rhythm with 1st degree AV block at 65 bpm, RBBB, LAFB  Recent Labs: 02/05/2019: B Natriuretic Peptide 382.6 03/08/2019: Pro B Natriuretic peptide (BNP) 215.0 09/03/2019: TSH 33.362 09/26/2019: ALT 18; BUN 16; Creatinine, Ser 1.48; Hemoglobin 14.8; Magnesium 1.9; Platelets 191.0;  Potassium 3.9; Sodium 138  Recent Lipid Panel    Component Value Date/Time   CHOL 136 03/08/2019 0946   TRIG 58.0 03/08/2019 0946   HDL 64.10 03/08/2019 0946   CHOLHDL 2 03/08/2019 0946   VLDL 11.6 03/08/2019 0946   LDLCALC 61 03/08/2019 0946   LDLDIRECT 81.0 08/31/2015 0905    Physical Exam:    VS:  BP 112/82 (BP Location: Left Arm, Patient Position: Sitting, Cuff Size: Large)   Pulse 65   Ht 6\' 1"  (1.854 m)   Wt 298 lb 6.4 oz (135.4 kg)   BMI 39.37 kg/m     Wt Readings from Last 3 Encounters:  01/25/20 298 lb 6.4 oz (135.4 kg)  12/14/19 (!) 300 lb 3.2 oz (136.2 kg)  11/01/19 (!) 303 lb (137.4 kg)   GEN: Well nourished, well developed in no acute distress HEENT: Normal, moist mucous membranes NECK: No JVD CARDIAC: regular rhythm, normal S1 and S2, no rubs or gallops. No murmur. VASCULAR: Radial and DP pulses 2+ bilaterally. No carotid bruits RESPIRATORY:  Clear to auscultation without rales, wheezing or rhonchi  ABDOMEN: Soft, non-tender, non-distended MUSCULOSKELETAL:  Ambulates independently SKIN: Warm and dry, no edema. Bilateral compression stockings in place. Legs are the least swollen I have ever seen them. NEUROLOGIC:  Alert and oriented  x 3. No focal neuro deficits noted. PSYCHIATRIC:  Normal affect   ASSESSMENT:    1. Chronic systolic heart failure (HCC)   2. Paroxysmal atrial fibrillation ([place])   3. NICM (nonischemic cardiomyopathy) ([place])   4. Chronic systolic dysfunction of left ventricle   5. Acquired thrombophilia ([place])   6. Essential hypertension   7. Type 2 diabetes mellitus with hyperlipidemia ([place])   8. Cardiac risk counseling   9. Counseling on health promotion and disease prevention    PLAN:    Nonischemic cardiomyopathy, with chronic systolic and diastolic heart failure:  -euvolemic, NYHA class I -had planned for [facility], then had asymptomatic covid positive test, then renal failure. Lexiscan done, no reversible ischemia. -he has tolerated losartan and spironolactone -legs have no swelling today, best I have seen. Continue 40 mg furosemide daily -repeat echo 09/04/19 with EF 40-45% -discussed cMRI, declines -discussed entresto, declines. Would like to stay on current medications -on GLP1RA given diabetes. Followed by Dr. Renne Crigler in endocrinology.  Medication history: -amiodarone and carvedilol stopped during admission 08/2019 due to symptomatic bradycardia, in the setting of hyperosmolar hyperglycemia. -off hydralazine 50 mg BID, imdur 30 mg daily due to hypotension  History of atrial fibrillation and atrial flutter: -CHADSVASC=3 -S/p CTI ablation 2012 -Has OSA, encouraged adherence to CPAP -Bradycardia with amiodarone, carvedilol -Continue dabigatran  Hypertension: -near goal of <130/80 today, continue medications as above  Type II diabetes with hyperlipidemia: -on semaglutide (GLP1RA) and insulin -continue rosuvastatin  Cardiac risk counseling and prevention recommendations: -recommend heart healthy/Mediterranean diet, with whole grains, fruits, vegetable, fish, lean meats, nuts, and olive oil. Limit salt. -recommend moderate walking, 3-5 times/week for 30-50 minutes each session. Aim for at  least 150 minutes.week. Goal should be pace of 3 miles/hours, or walking 1.5 miles in 30 minutes -recommend avoidance of tobacco products. Avoid excess alcohol. -ASCVD risk score: The 10-year ASCVD risk score Mikey Bussing DC Jr., et al., 2013) is: 14.6%   Values used to calculate the score:     Age: 74 years     Sex: Male     Is Non-Hispanic African American: No     Diabetic: Yes     Tobacco smoker: No  Systolic Blood Pressure: 440 mmHg     Is BP treated: Yes     HDL Cholesterol: 64.1 mg/dL     Total Cholesterol: 136 mg/dL    Plan for follow up: 6 mos or sooner PRN  Medication Adjustments/Labs and Tests Ordered: Current medicines are reviewed at length with the patient today.  Concerns regarding medicines are outlined above.  Orders Placed This Encounter  Procedures  . EKG 12-Lead   Meds ordered this encounter  Medications  . furosemide (LASIX) 40 MG tablet    Sig: Take 1 tablet (40 mg total) by mouth daily.    Dispense:  90 tablet    Refill:  3    Patient Instructions  Medication Instructions:  The current medical regimen is effective;  continue present plan and medications.  *If you need a refill on your cardiac medications before your next appointment, please call your pharmacy*   Follow-Up: At [facility], you and your health needs are our priority.  As part of our continuing mission to provide you with exceptional heart care, we have created designated Provider Care Teams.  These Care Teams include your primary Cardiologist (physician) and Advanced Practice Providers (APPs -  Physician Assistants and Nurse Practitioners) who all work together to provide you with the care you need, when you need it.  We recommend signing up for the patient portal called "MyChart".  Sign up information is provided on this After Visit Summary.  MyChart is used to connect with patients for Virtual Visits (Telemedicine).  Patients are able to view lab/test results, encounter notes, upcoming  appointments, etc.  Non-urgent messages can be sent to your provider as well.   To learn more about what you can do with MyChart, go to [email or web address].    Your next appointment:   6 month(s)  The format for your next appointment:   In Person  Provider:   Buford Dresser, MD       Signed, Buford Dresser, MD PhD 01/25/2020  [place]

## 2020-01-26 ENCOUNTER — Encounter: Payer: Self-pay | Admitting: Cardiology

## 2020-02-04 ENCOUNTER — Other Ambulatory Visit: Payer: Self-pay | Admitting: Internal Medicine

## 2020-02-24 ENCOUNTER — Ambulatory Visit (INDEPENDENT_AMBULATORY_CARE_PROVIDER_SITE_OTHER): Payer: Medicare HMO | Admitting: Internal Medicine

## 2020-02-24 ENCOUNTER — Other Ambulatory Visit: Payer: Self-pay

## 2020-02-24 ENCOUNTER — Encounter: Payer: Self-pay | Admitting: Internal Medicine

## 2020-02-24 VITALS — BP 126/70 | HR 80 | Ht 73.0 in | Wt 288.0 lb

## 2020-02-24 DIAGNOSIS — E039 Hypothyroidism, unspecified: Secondary | ICD-10-CM | POA: Diagnosis not present

## 2020-02-24 DIAGNOSIS — E1159 Type 2 diabetes mellitus with other circulatory complications: Secondary | ICD-10-CM | POA: Diagnosis not present

## 2020-02-24 DIAGNOSIS — E1165 Type 2 diabetes mellitus with hyperglycemia: Secondary | ICD-10-CM

## 2020-02-24 DIAGNOSIS — E669 Obesity, unspecified: Secondary | ICD-10-CM

## 2020-02-24 LAB — POCT GLYCOSYLATED HEMOGLOBIN (HGB A1C): Hemoglobin A1C: 5.3 % (ref 4.0–5.6)

## 2020-02-24 NOTE — Progress Notes (Signed)
Patient ID: [name], male   DOB: 1955-12-13, 64 y.o.   MRN: [id number]   This visit occurred during the SARS-CoV-2 public health emergency.  Safety protocols were in place, including screening questions prior to the visit, additional usage of staff PPE, and extensive cleaning of exam room while observing appropriate contact time as indicated for disinfecting solutions.   HPI: [name] is a 64 y.o.-year-old male, referred by his PCP, Dr. Sharlet Salina, for management of DM2, dx in 2017, insulin-dependent, uncontrolled, with complications (CHF, Afib/AFlutter, CKD, h/o calf diabetic ulcer).  Last visit 4 months ago.  Reviewed HbA1c levels: Lab Results  Component Value Date   HGBA1C 14.2 (H) 09/04/2019   HGBA1C 15.0 (H) 09/03/2019   HGBA1C 6.9 (H) 03/08/2019   HGBA1C 6.7 (H) 01/31/2019   HGBA1C 6.1 11/26/2017   HGBA1C 6.2 09/09/2016   HGBA1C 9.5 (H) 08/31/2015   HGBA1C 6.0 12/13/2014   At last visit he was on: - Levemir 40 units 2x a day - Novolog 5 units + SSI 3x a day, before meals: BG 70-120: 0 Units, BG 121-150:3 units, BG 151-200:4 Units, BG 201-250:7 units BG 251-300: 11 Units, 301-350 : 15 units. If BG >350: Call your primary care doctor He was on Glipizide since 2017.  We changed to: - Ozempic 0.5 mg weekly in a.m.  - Tresiba U200 66 units daily - Novolog 8-12 units based on the size of the meal (8-12-8 units)  He is now on a freestyle libre CGM, attached after last visit (pays out of pocket).  Pt checks his sugars more than 4 times a day.  Freestyle libre CGM parameters: - Average: 104 - % active CGM time: 78% of the time - Glucose variability 23.5%  (target < or = to 36%) - GMI 5.8% - time in range:  - very low (<54): 0% - low (54-69): 2% - normal range (70-180): 97% - high sugars (181-250): 1% - very high sugars (>250): 0%     Prev.: - am: 71, 138, 145-174, 191 - 2h after b'fast: n/c - before lunch:  114-168, 171 - 2h after lunch: n/c - before  dinner: 191-178, 195 - 2h after dinner: n/c - bedtime: n/c - nighttime: n/c Lowest sugar was 71; he has hypoglycemia awareness in the 70s.  Highest sugar was 186, then 330. He wasadmitted in 08/2019 with hyperosmolar hyperglycemic nonketotic state, and was found to have a glucose of 1186, along with hyponatremia and AKI.  An HbA1c level was very high.  Glucometer: [facility]   Pt's meals are: - Breakfast: fruit - Lunch: salad - Dinner: salad, chili, hamburger patty, grilled chicken - Snacks:- He saw nutrition in 2016  -+ CKD, last BUN/creatinine:  Lab Results  Component Value Date   BUN 16 09/26/2019   BUN 16 09/07/2019   CREATININE 1.48 09/26/2019   CREATININE 1.40 (H) 09/07/2019  On Cozaar 50.  -+ HL; last set of lipids: Lab Results  Component Value Date   CHOL 136 03/08/2019   HDL 64.10 03/08/2019   LDLCALC 61 03/08/2019   LDLDIRECT 81.0 08/31/2015   TRIG 58.0 03/08/2019   CHOLHDL 2 03/08/2019  On Crestor 10.  - last eye exam was in 10/2019: No DR  - no numbness and tingling in his feet.  He has a history of right calf ulcer- he required skin graft - 2020  Pt has no FH of DM.  Uncontrolled hypothyroidism:    Reviewed his TFTs: Lab Results  Component Value Date  TSH 33.362 (H) 09/03/2019   TSH 13.910 (H) 02/02/2019   TSH 25.834 (H) 01/31/2019   TSH 4.800 (H) 05/03/2018   TSH 3.39 11/26/2017   TSH 3.510 03/18/2017   TSH 3.51 08/31/2015   TSH 3.49 10/07/2013   TSH 8.38 (H) 04/26/2010   TSH 55.23 (H) 02/06/2010   Pt is on levothyroxine 50 mcg daily, taken: - in am - fasting - coffee + creamer >> black coffee - at least 30 min from b'fast if does not skip breakfast - no Ca, Fe, PPIs - + MVIs now later in the day - not on Biotin  He also has a history of HTN, OSA.  At last visit he described that he drank 4-5 beers every day.  Previously was drinking hard liquor.  ROS: Constitutional: no weight gain/+ weight loss, no fatigue, no subjective hyperthermia,  no subjective hypothermia Eyes: no blurry vision, no xerophthalmia ENT: no sore throat, no nodules palpated in neck, no dysphagia, no odynophagia, no hoarseness Cardiovascular: no CP/no SOB/no palpitations/no leg swelling Respiratory: no cough/no SOB/no wheezing Gastrointestinal: no N/no V/no D/no C/no acid reflux Musculoskeletal: no muscle aches/+ joint aches Skin: no rashes, no hair loss Neurological: no tremors/no numbness/no tingling/no dizziness  I reviewed pt's medications, allergies, PMH, social hx, family hx, and changes were documented in the history of present illness. Otherwise, unchanged from my initial visit note.  Past Medical History:  Diagnosis Date  . Asthma    as a teenager - does not use an inhaler although pt said he has an albuteral inhaler  . Atrial fibrillation ([place])    persistant 02/2009  . Atrial flutter ([place])    s/p CTI ablation 08/06/10  . Cataract   . Chronic rhinitis   . Colon polyp   . Congestive heart failure ([place])   . Diverticulosis    colonoscopy 04/03/2009  . Hyperlipidemia   . Hypertension   . Morbid obesity ([place])    target weight = 219  for BMI < 30  . Nonischemic cardiomyopathy ([facility])    tachycardia mediated  . Seasonal allergies   . Sleep apnea    original 2001 - wears C-PaP  . Type 2 diabetes mellitus ([place])    Past Surgical History:  Procedure Laterality Date  . ATRIAL FIBRILLATION ABLATION N/A 12/14/2018   Procedure: ATRIAL FIBRILLATION ABLATION;  Surgeon: Thompson Grayer, MD;  Location: [place] [facility];  Service: Cardiovascular;  Laterality: N/A;  . atrial flutter ablation  08/06/10  . CARDIOVERSION N/A 07/05/2018   Procedure: CARDIOVERSION;  Surgeon: Buford Dresser, MD;  Location: [facility] ENDOS[facility];  Service: Cardiovascular;  Laterality: N/A;  . COLONOSCOPY    . POLYPECTOMY    . TEE WITHOUT CARDIOVERSION N/A 12/14/2018   Procedure: TRANSESOPHAGEAL ECHOCARDIOGRAM (TEE);  Surgeon: Thompson Grayer, MD;  Location: [place] [facility];   Service: Cardiovascular;  Laterality: N/A;   Social History   Socioeconomic History  . Marital status: Single    Spouse name: Not on file  . Number of children: 0  . Years of education: Not on file  . Highest education level: Not on file  Occupational History  . Occupation: Sales  Tobacco Use  . Smoking status: Never Smoker  . Smokeless tobacco: Never Used  Vaping Use  . Vaping Use: Never used  Substance and Sexual Activity  . Alcohol use: Yes    Alcohol/week: 21.0 standard drinks    Types: 21 Cans of beer per week    Comment: previously quite heavy, working on cessation   .  Drug use: No  . Sexual activity: Not on file  Other Topics Concern  . Not on file  Social History Narrative   Lives in [place] and works for [employer]   Social Determinants of Health   Financial Resource Strain:   . Difficulty of Paying Living Expenses: Not on file  Food Insecurity:   . Worried About Charity fundraiser in the Last Year: Not on file  . Ran Out of Food in the Last Year: Not on file  Transportation Needs:   . Lack of Transportation (Medical): Not on file  . Lack of Transportation (Non-Medical): Not on file  Physical Activity:   . Days of Exercise per Week: Not on file  . Minutes of Exercise per Session: Not on file  Stress:   . Feeling of Stress : Not on file  Social Connections:   . Frequency of Communication with Friends and Family: Not on file  . Frequency of Social Gatherings with Friends and Family: Not on file  . Attends Religious Services: Not on file  . Active Member of Clubs or Organizations: Not on file  . Attends Archivist Meetings: Not on file  . Marital Status: Not on file  Intimate Partner Violence:   . Fear of Current or Ex-Partner: Not on file  . Emotionally Abused: Not on file  . Physically Abused: Not on file  . Sexually Abused: Not on file   Current Outpatient Medications on File Prior to Visit  Medication Sig Dispense Refill  . albuterol  (PROVENTIL HFA;VENTOLIN HFA) 108 (90 Base) MCG/ACT inhaler Inhale 2 puffs into the lungs every 6 (six) hours as needed for wheezing or shortness of breath.    . Continuous Blood Gluc Receiver (FREESTYLE LIBRE 14 DAY READER) DEVI USE AS DIRECTED 1 each 11  . Continuous Blood Gluc Sensor (FREESTYLE LIBRE 14 DAY SENSOR) MISC Use as directed by Diabetes educator 2 each 11  . dabigatran (PRADAXA) 150 MG CAPS capsule Take 1 capsule (150 mg total) by mouth 2 (two) times daily. 180 capsule 3  . doxylamine, Sleep, (UNISOM) 25 MG tablet Take 50 mg by mouth at bedtime as needed for sleep.     . folic acid (FOLVITE) 1 MG tablet Take 1 tablet (1 mg total) by mouth daily. 30 tablet 1  . furosemide (LASIX) 40 MG tablet Take 1 tablet (40 mg total) by mouth daily. 90 tablet 3  . gabapentin (NEURONTIN) 300 MG capsule TAKE 1 CAPSULE BY MOUTH THREE TIMES A DAY 270 capsule 2  . insulin aspart (NOVOLOG) 100 UNIT/ML FlexPen Inject 8-12 Units into the skin 3 (three) times daily with meals. 30 mL 3  . insulin degludec (TRESIBA FLEXTOUCH) 200 UNIT/ML FlexTouch Pen Inject 66 Units into the skin daily. 9 pen 3  . Insulin Pen Needle (B-D UF III MINI PEN NEEDLES) 31G X 5 MM MISC 1 each by Other route 4 (four) times daily. E11.9 120 each 4  . levothyroxine (SYNTHROID) 50 MCG tablet TAKE 1 TABLET BY MOUTH DAILY BEFORE BREAKFAST 30 tablet 2  . losartan (COZAAR) 25 MG tablet Take 0.5 tablets (12.5 mg total) by mouth every morning. 30 tablet 1  . mometasone (NASONEX) 50 MCG/ACT nasal spray Place 2 sprays into the nose daily as needed (allergies or rhinitis).     . rosuvastatin (CRESTOR) 10 MG tablet TAKE 1 TABLET BY MOUTH EVERY DAY (Patient taking differently: Take 10 mg by mouth at bedtime. ) 90 tablet 3  . Semaglutide,0.25 or  0.5MG /DOS, (OZEMPIC, 0.25 OR 0.5 MG/DOSE,) 2 MG/1.5ML SOPN Inject 0.5 mg into the skin once a week. 2 pen 5  . spironolactone (ALDACTONE) 25 MG tablet TAKE 1/2 TABLET BY MOUTH EVERY DAY 45 tablet 3  .  SYMBICORT 80-4.5 MCG/ACT inhaler USE 2 INHALATIONS TWICE A DAY (Patient taking differently: Inhale 2 puffs into the lungs 2 (two) times daily as needed (for flares). ) 30.6 g 1  . thiamine 100 MG tablet Take 1 tablet (100 mg total) by mouth daily. 30 tablet 1  . vitamin C (ASCORBIC ACID) 500 MG tablet Take 500-1,000 mg by mouth daily.     No current facility-administered medications on file prior to visit.   No Known Allergies Family History  Problem Relation Age of Onset  . Hypertension Mother   . Kidney failure Mother        Due to sepsis  . COPD Father   . Colon cancer Brother        dx in 7's  . Esophageal cancer Neg Hx   . Rectal cancer Neg Hx   . Stomach cancer Neg Hx     PE: BP 126/70   Pulse 80   Ht 6\' 1"  (1.854 m)   Wt 288 lb (130.6 kg)   SpO2 99%   BMI 38.00 kg/m  Wt Readings from Last 3 Encounters:  02/24/20 288 lb (130.6 kg)  01/25/20 298 lb 6.4 oz (135.4 kg)  12/14/19 (!) 300 lb 3.2 oz (136.2 kg)   Constitutional: overweight, in NAD Eyes: PERRLA, EOMI, no exophthalmos ENT: moist mucous membranes, no thyromegaly, no cervical lymphadenopathy Cardiovascular: RRR, No MRG Respiratory: CTA B Gastrointestinal: abdomen soft, NT, ND, BS+ Musculoskeletal: no deformities, strength intact in all 4 Skin: moist, warm, no rashes Neurological: no tremor with outstretched hands, DTR normal in all 4  ASSESSMENT: 1. DM2, insulin-dependent, uncontrolled, with complications - CHF - A. fib/a flutter - CKD - h/o Diabetic ulcer of calf  2.  Hypothyroidism - Uncontrolled  3. Obesity class 2  PLAN:  1. Patient with longstanding, uncontrolled, type 2 diabetes, insulin-dependent, on basal-bolus insulin regimen, which we adjusted at last visit and also started on weekly GLP-1 receptor agonist at that time.  His HbA1c at last check was very high, at 14.2%.  He was also drinking 4-5 beers a day and I strongly advised him to stop.  He has a history of HHNK 2 months prior to our  last visit, when he was admitted with a glucose over 1000.  He was started on a basal-bolus insulin regimen in the hospital.  At last visit sugars were improved, but still above target.  I suggested to change to [place] at that time, increased his NPH and added Ozempic.  He tolerates Ozempic well, without GI side effects.  Overall, he feels much better compared to last visit! He also obtained a CGM (however, he pays out-of-pocket for it: ~120$ per mo) and he attached it since last visit. CGM interpretation: -At today's visit, we reviewed together his CGM traces and they show spectacularly improved control since his admission.  His sugars are very in the very narrow range, low normal.  He will occasionally has higher blood sugars after lunch, but this is a rare incidence (1 day in the last 2 weeks - milkshake).  His sugars are in target range 97% of the time was only 2% low blood sugars and 1% high blood sugars.  She is HbA1c extrapolated from the last 2 weeks of data is  5.8%!  This is excellent control, and we discussed about possibly reducing his insulin doses at this point.  He takes 28 units of NovoLog daily and 66 units of [place].  I advised him to decrease Tresiba dose to 50 units and, if the sugars remain well controlled, he can even try to decrease it more in 1-2 weeks. - I suggested to:  Patient Instructions  Please continue: - Ozempic 0.5 mg weekly in a.m.  - Tresiba U200 66 units daily - Novolog 8-12 units based on the size of the meal  Please continue Levothyroxine 50 mcg daily.  Take the thyroid hormone every day, with water, at least 30 minutes before breakfast, separated by at least 4 hours from: - acid reflux medications - calcium - iron - multivitamins  Please return in 4 months.   - we checked his HbA1c: 5.3% (Spectacular decrease!) - advised to check sugars at different times of the day - 4x a day, rotating check times - advised for yearly eye exams >> he is UTD - return to  clinic in 4 months  2.  Hypothyroidism - uncontrolled - latest thyroid labs reviewed with pt >> TSH is very high: Lab Results  Component Value Date   TSH 33.362 (H) 09/03/2019   - he continues on LT4 50 mcg daily - pt feels good on this dose. - we discussed about taking the thyroid hormone every day, with water, >30 minutes before breakfast, separated by >4 hours from acid reflux medications, calcium, iron, multivitamins. Pt. is taking it correctly now.  At last visit, she was taking this incorrectly, taking multivitamins along with levothyroxine and drinking coffee with creamer at the same time.  She eliminated creamer since last visit the most multivitamins later in the day. - will check thyroid tests early next week: TSH and fT4 - If labs are abnormal, he will need to return for repeat TFTs in 1.5 months  3. Obesity class 2 - lost 15 lbs since last OV!  Congratulated him. - continue GLP1 R agonist which should also help with wt loss - decreasing insulin should also help further with wt loss  Orders Placed This Encounter  Procedures  . TSH  . T4, free  . POCT glycosylated hemoglobin (Hb A1C)   Philemon Kingdom, MD PhD [facility] Endocrinology

## 2020-02-24 NOTE — Patient Instructions (Addendum)
Please continue: - Ozempic 0.5 mg weekly in a.m.  - Novolog 8-12 units based on the size of the meal  Please decrease: - Tresiba U200 50 units daily  Please continue Levothyroxine 50 mcg daily.  Take the thyroid hormone every day, with water, at least 30 minutes before breakfast, separated by at least 4 hours from: - acid reflux medications - calcium - iron - multivitamins  Please return in 4 months.

## 2020-02-26 ENCOUNTER — Other Ambulatory Visit: Payer: Self-pay | Admitting: Internal Medicine

## 2020-03-09 DIAGNOSIS — G4733 Obstructive sleep apnea (adult) (pediatric): Secondary | ICD-10-CM | POA: Diagnosis not present

## 2020-03-09 DIAGNOSIS — G4730 Sleep apnea, unspecified: Secondary | ICD-10-CM | POA: Diagnosis not present

## 2020-04-20 ENCOUNTER — Other Ambulatory Visit: Payer: Self-pay | Admitting: Internal Medicine

## 2020-05-06 ENCOUNTER — Other Ambulatory Visit: Payer: Self-pay | Admitting: Internal Medicine

## 2020-05-16 ENCOUNTER — Other Ambulatory Visit: Payer: Self-pay

## 2020-05-16 ENCOUNTER — Encounter: Payer: Self-pay | Admitting: Internal Medicine

## 2020-05-16 ENCOUNTER — Ambulatory Visit (INDEPENDENT_AMBULATORY_CARE_PROVIDER_SITE_OTHER): Payer: Medicare HMO | Admitting: Internal Medicine

## 2020-05-16 VITALS — BP 160/80 | HR 98 | Temp 98.0°F | Ht 73.0 in | Wt 286.8 lb

## 2020-05-16 DIAGNOSIS — E785 Hyperlipidemia, unspecified: Secondary | ICD-10-CM

## 2020-05-16 DIAGNOSIS — E1169 Type 2 diabetes mellitus with other specified complication: Secondary | ICD-10-CM | POA: Diagnosis not present

## 2020-05-16 DIAGNOSIS — G4733 Obstructive sleep apnea (adult) (pediatric): Secondary | ICD-10-CM | POA: Diagnosis not present

## 2020-05-16 DIAGNOSIS — E039 Hypothyroidism, unspecified: Secondary | ICD-10-CM

## 2020-05-16 DIAGNOSIS — I10 Essential (primary) hypertension: Secondary | ICD-10-CM | POA: Diagnosis not present

## 2020-05-16 DIAGNOSIS — I480 Paroxysmal atrial fibrillation: Secondary | ICD-10-CM

## 2020-05-16 DIAGNOSIS — J4520 Mild intermittent asthma, uncomplicated: Secondary | ICD-10-CM

## 2020-05-16 DIAGNOSIS — I5022 Chronic systolic (congestive) heart failure: Secondary | ICD-10-CM | POA: Diagnosis not present

## 2020-05-16 DIAGNOSIS — Z0000 Encounter for general adult medical examination without abnormal findings: Secondary | ICD-10-CM | POA: Diagnosis not present

## 2020-05-16 DIAGNOSIS — Z23 Encounter for immunization: Secondary | ICD-10-CM | POA: Diagnosis not present

## 2020-05-16 LAB — COMPREHENSIVE METABOLIC PANEL
ALT: 23 U/L (ref 0–53)
AST: 29 U/L (ref 0–37)
Albumin: 4.4 g/dL (ref 3.5–5.2)
Alkaline Phosphatase: 55 U/L (ref 39–117)
BUN: 17 mg/dL (ref 6–23)
CO2: 31 mEq/L (ref 19–32)
Calcium: 9.4 mg/dL (ref 8.4–10.5)
Chloride: 101 mEq/L (ref 96–112)
Creatinine, Ser: 1.56 mg/dL — ABNORMAL HIGH (ref 0.40–1.50)
GFR: 46.36 mL/min — ABNORMAL LOW (ref >60.00)
Glucose, Bld: 102 mg/dL — ABNORMAL HIGH (ref 70–99)
Potassium: 3.7 mEq/L (ref 3.5–5.1)
Sodium: 139 mEq/L (ref 135–145)
Total Bilirubin: 0.7 mg/dL (ref 0.2–1.2)
Total Protein: 7.8 g/dL (ref 6.0–8.3)

## 2020-05-16 LAB — LIPID PANEL
Cholesterol: 143 mg/dL (ref 0–200)
HDL: 53.2 mg/dL (ref >39.00)
LDL Cholesterol: 69 mg/dL (ref 0–99)
NonHDL: 90.15
Total CHOL/HDL Ratio: 3
Triglycerides: 107 mg/dL (ref 0.0–149.0)
VLDL: 21.4 mg/dL (ref 0.0–40.0)

## 2020-05-16 LAB — CBC
HCT: 51.3 % (ref 39.0–52.0)
Hemoglobin: 17.6 g/dL — ABNORMAL HIGH (ref 13.0–17.0)
MCHC: 34.3 g/dL (ref 30.0–36.0)
MCV: 90 fl (ref 78.0–100.0)
Platelets: 210 10*3/uL (ref 150.0–400.0)
RBC: 5.7 Mil/uL (ref 4.22–5.81)
RDW: 13.6 % (ref 11.5–15.5)
WBC: 8.8 10*3/uL (ref 4.0–10.5)

## 2020-05-16 LAB — TSH: TSH: 4.61 u[IU]/mL — ABNORMAL HIGH (ref 0.35–4.50)

## 2020-05-16 LAB — T4, FREE: Free T4: 0.8 ng/dL (ref 0.60–1.60)

## 2020-05-16 LAB — MICROALBUMIN / CREATININE URINE RATIO
Creatinine,U: 36.2 mg/dL
Microalb Creat Ratio: 51.1 mg/g — ABNORMAL HIGH (ref 0.0–30.0)
Microalb, Ur: 18.5 mg/dL — ABNORMAL HIGH (ref 0.0–1.9)

## 2020-05-16 MED ORDER — LOSARTAN POTASSIUM 25 MG PO TABS
25.0000 mg | ORAL_TABLET | Freq: Every morning | ORAL | 3 refills | Status: DC
Start: 2020-05-16 — End: 2020-08-10

## 2020-05-16 NOTE — Patient Instructions (Signed)

## 2020-05-16 NOTE — Progress Notes (Signed)
Subjective:   Patient ID: [name], male    DOB: January 27, 1956, 64 y.o.   MRN: [id number]  HPI Here for welcome to medicare wellness and physical, no new complaints. Please see A/P for status and treatment of chronic medical problems.   Diet: DM since diabetic Physical activity: sedentary Depression/mood screen: negative Hearing: intact to whispered voice Visual acuity: grossly normal, performs annual eye exam with [facility] May 2021 ADLs: capable Fall risk: none Home safety: good Cognitive evaluation: intact to orientation, naming, recall and repetition EOL planning: adv directives discussed, in place    Office Visit from 05/16/2020 in [place] at [facility]  PHQ-2 Total Score 0       Hearing Screening   125Hz  250Hz  500Hz  1000Hz  2000Hz  3000Hz  4000Hz  6000Hz  8000Hz   Right ear:           Left ear:             Visual Acuity Screening   Right eye Left eye Both eyes  Without correction:     With correction: 20/20 20/20 20/20       Office Visit from 05/16/2020 in [place] at [facility]  PHQ-9 Total Score 0    I have personally reviewed and have noted 1. The patient's medical and social history - reviewed today no changes 2. Their use of alcohol, tobacco or illicit drugs 3. Their current medications and supplements 4. The patient's functional ability including ADL's, fall risks, home safety risks and hearing or visual impairment. 5. Diet and physical activities 6. Evidence for depression or mood disorders 7. Care team reviewed and updated  Patient Care Team: [name], MD as PCP - General (Internal Medicine) Buford Dresser, MD as PCP - Cardiology (Cardiology) Thompson Grayer, MD as PCP - Electrophysiology (Cardiology) Past Medical History:  Diagnosis Date  . Asthma    as a teenager - does not use an inhaler although pt said he has an albuteral inhaler  . Atrial fibrillation ([place])    persistant 02/2009  .  Atrial flutter ([place])    s/p CTI ablation 08/06/10  . Cataract   . Chronic rhinitis   . Colon polyp   . Congestive heart failure ([name])   . Diverticulosis    colonoscopy 04/03/2009  . Hyperlipidemia   . Hypertension   . Morbid obesity ([place])    target weight = 219  for BMI < 30  . Nonischemic cardiomyopathy ([facility])    tachycardia mediated  . Seasonal allergies   . Sleep apnea    original 2001 - wears C-PaP  . Type 2 diabetes mellitus ([place])    Past Surgical History:  Procedure Laterality Date  . ATRIAL FIBRILLATION ABLATION N/A 12/14/2018   Procedure: ATRIAL FIBRILLATION ABLATION;  Surgeon: Thompson Grayer, MD;  Location: [place] [facility];  Service: Cardiovascular;  Laterality: N/A;  . atrial flutter ablation  08/06/10  . CARDIOVERSION N/A 07/05/2018   Procedure: CARDIOVERSION;  Surgeon: Buford Dresser, MD;  Location: [facility] ENDOSCOPY;  Service: Cardiovascular;  Laterality: N/A;  . COLONOSCOPY    . POLYPECTOMY    . TEE WITHOUT CARDIOVERSION N/A 12/14/2018   Procedure: TRANSESOPHAGEAL ECHOCARDIOGRAM (TEE);  Surgeon: Thompson Grayer, MD;  Location: [place] [facility];  Service: Cardiovascular;  Laterality: N/A;   Family History  Problem Relation Age of Onset  . Hypertension Mother   . Kidney failure Mother        Due to sepsis  . COPD Father   . Colon  cancer Brother        dx in 47's  . Esophageal cancer Neg Hx   . Rectal cancer Neg Hx   . Stomach cancer Neg Hx    Review of Systems  Constitutional: Negative.   HENT: Negative.   Eyes: Negative.   Respiratory: Negative for cough, chest tightness and shortness of breath.   Cardiovascular: Negative for chest pain, palpitations and leg swelling.  Gastrointestinal: Negative for abdominal distention, abdominal pain, constipation, diarrhea, nausea and vomiting.  Musculoskeletal: Negative.   Skin: Negative.   Neurological: Negative.   Psychiatric/Behavioral: Negative.     Objective:  Physical Exam Constitutional:       Appearance: He is well-developed.  HENT:     Head: Normocephalic and atraumatic.  Cardiovascular:     Rate and Rhythm: Normal rate and regular rhythm.  Pulmonary:     Effort: Pulmonary effort is normal. No respiratory distress.     Breath sounds: Normal breath sounds. No wheezing or rales.  Abdominal:     General: Bowel sounds are normal. There is no distension.     Palpations: Abdomen is soft.     Tenderness: There is no abdominal tenderness. There is no rebound.  Musculoskeletal:     Cervical back: Normal range of motion.  Skin:    General: Skin is warm and dry.  Neurological:     Mental Status: He is alert and oriented to person, place, and time.     Coordination: Coordination normal.     Vitals:   05/16/20 0958  BP: (!) 160/80  Pulse: 98  Temp: 98 F (36.7 C)  TempSrc: Oral  SpO2: 99%  Weight: 286 lb 12.8 oz (130.1 kg)  Height: 6\' 1"  (1.854 m)   This visit occurred during the SARS-CoV-2 public health emergency.  Safety protocols were in place, including screening questions prior to the visit, additional usage of staff PPE, and extensive cleaning of exam room while observing appropriate contact time as indicated for disinfecting solutions.   Assessment & Plan:  Prevnar 13 given at visit

## 2020-05-17 NOTE — Assessment & Plan Note (Signed)
BP mildly high today prior to meds. Typically in normal range at home. Taking losartan, spironolactone, lasix. Checking CMP and adjust as needed.

## 2020-05-17 NOTE — Assessment & Plan Note (Signed)
On predaxa for anticoagulation.

## 2020-05-17 NOTE — Assessment & Plan Note (Signed)
Foot exam done and eye exam up to date he could not remember provider. Follows with endo for management and has made great strides this year to get sugars under control. On ARB and statin.

## 2020-05-17 NOTE — Assessment & Plan Note (Signed)
Needs new CPAP machine which is ordered. Sleep study from 2002 and we do have copy of this.

## 2020-05-17 NOTE — Assessment & Plan Note (Signed)
Checking TSH and free T4, on synthroid 50 mcg daily.

## 2020-05-17 NOTE — Assessment & Plan Note (Signed)
Taking crestor and checking lipid panel. Adjust as needed.

## 2020-05-17 NOTE — Assessment & Plan Note (Signed)
On statin, spironolactone and lasix and losartan. No chest pains currently.

## 2020-05-17 NOTE — Assessment & Plan Note (Signed)
BMI 37 but complicated by diabetes, hypertension, hyperlipidemia.

## 2020-05-17 NOTE — Assessment & Plan Note (Signed)
Flu shot up to date. Covid-19 up to date including booster. Pneumonia 13 given to start series. Shingrix complete. Tetanus due 2026. Colonoscopy due 2023. Counseled about sun safety and mole surveillance. Counseled about the dangers of distracted driving. Given 10 year screening recommendations.

## 2020-05-17 NOTE — Assessment & Plan Note (Signed)
No flare today, albuterol prn and symbicort.

## 2020-06-01 ENCOUNTER — Other Ambulatory Visit (HOSPITAL_COMMUNITY): Payer: Self-pay | Admitting: Cardiology

## 2020-06-07 DIAGNOSIS — G4730 Sleep apnea, unspecified: Secondary | ICD-10-CM | POA: Diagnosis not present

## 2020-06-07 DIAGNOSIS — G4733 Obstructive sleep apnea (adult) (pediatric): Secondary | ICD-10-CM | POA: Diagnosis not present

## 2020-06-16 ENCOUNTER — Other Ambulatory Visit: Payer: Self-pay | Admitting: Internal Medicine

## 2020-06-16 DIAGNOSIS — I5022 Chronic systolic (congestive) heart failure: Secondary | ICD-10-CM

## 2020-06-23 ENCOUNTER — Other Ambulatory Visit: Payer: Self-pay | Admitting: Internal Medicine

## 2020-06-29 ENCOUNTER — Encounter: Payer: Self-pay | Admitting: Endocrinology

## 2020-06-29 ENCOUNTER — Encounter: Payer: Self-pay | Admitting: Internal Medicine

## 2020-06-29 ENCOUNTER — Ambulatory Visit: Payer: Medicare HMO | Admitting: Internal Medicine

## 2020-06-29 ENCOUNTER — Other Ambulatory Visit: Payer: Self-pay

## 2020-06-29 VITALS — BP 164/100 | HR 83 | Ht 73.0 in | Wt 288.0 lb

## 2020-06-29 DIAGNOSIS — E669 Obesity, unspecified: Secondary | ICD-10-CM

## 2020-06-29 DIAGNOSIS — E039 Hypothyroidism, unspecified: Secondary | ICD-10-CM | POA: Diagnosis not present

## 2020-06-29 DIAGNOSIS — E785 Hyperlipidemia, unspecified: Secondary | ICD-10-CM | POA: Diagnosis not present

## 2020-06-29 DIAGNOSIS — E1169 Type 2 diabetes mellitus with other specified complication: Secondary | ICD-10-CM

## 2020-06-29 LAB — TSH: TSH: 3.52 u[IU]/mL (ref 0.35–4.50)

## 2020-06-29 LAB — POCT GLYCOSYLATED HEMOGLOBIN (HGB A1C): Hemoglobin A1C: 5.7 % — AB (ref 4.0–5.6)

## 2020-06-29 LAB — T4, FREE: Free T4: 0.86 ng/dL (ref 0.60–1.60)

## 2020-06-29 MED ORDER — TRESIBA FLEXTOUCH 200 UNIT/ML ~~LOC~~ SOPN
40.0000 [IU] | PEN_INJECTOR | Freq: Every day | SUBCUTANEOUS | 3 refills | Status: DC
Start: 2020-06-29 — End: 2020-10-05

## 2020-06-29 NOTE — Progress Notes (Addendum)
Patient ID: [name], male   DOB: Apr 24, 1956, 65 y.o.   MRN: [id number]   This visit occurred during the SARS-CoV-2 public health emergency.  Safety protocols were in place, including screening questions prior to the visit, additional usage of staff PPE, and extensive cleaning of exam room while observing appropriate contact time as indicated for disinfecting solutions.   HPI: [name] is a 65 y.o.-year-old male, referred by his PCP, Dr. Sharlet Salina, for management of DM2, dx in 2017, insulin-dependent, uncontrolled, with complications (CHF, Afib/AFlutter, CKD, h/o calf diabetic ulcer).  Last visit 4 mo ago.  Reviewed HbA1c levels Lab Results  Component Value Date   HGBA1C 5.3 02/24/2020   HGBA1C 14.2 (H) 09/04/2019   HGBA1C 15.0 (H) 09/03/2019   HGBA1C 6.9 (H) 03/08/2019   HGBA1C 6.7 (H) 01/31/2019   HGBA1C 6.1 11/26/2017   HGBA1C 6.2 09/09/2016   HGBA1C 9.5 (H) 08/31/2015   HGBA1C 6.0 12/13/2014   At last visit he was on: - Levemir 40 units 2x a day - Novolog 5 units + SSI 3x a day, before meals: BG 70-120: 0 Units, BG 121-150:3 units, BG 151-200:4 Units, BG 201-250:7 units BG 251-300: 11 Units, 301-350 : 15 units. If BG >350: Call your primary care doctor He was on Glipizide since 2017.  We changed to: - Ozempic 0.5 mg weekly in a.m.  - Tresiba U200 66 >> 50 units daily - Novolog 8-12 units based on the size of the meal (8-12-8 units)  He checks his sugars more than 4 times a day with his freestyle libre CGM (he pays out-of-pocket for this).  Freestyle libre CGM parameters: - Average: 104 >> 134 - % active CGM time: 78% >> 77% of the time - Glucose variability 23.5% >> 28.5% (target < or = to 36%) - GMI 5.8% >> 6.5% - time in range:  - very low (<54): 0% >> 0%  - low (54-69): 2% >> 0% - normal range (70-180): 97% >> 87% - high sugars (181-250): 1% >> 12% - very high sugars (>250): 0% >> 1%    Previously:   Lowest sugar was 71 >> 58 (today - delayed  lunch); he has hypoglycemia awareness in the 70s. Highest sugar was 186, then 330 >> 230 x1. He wasadmitted in 08/2019 with hyperosmolar hyperglycemic nonketotic state, and was found to have a glucose of 1186, along with hyponatremia and AKI.  An HbA1c level was very high.  Glucometer: [facility]   Pt's meals are: - Breakfast: fruit >> bisquit, poptarts - Lunch: salad - Dinner: salad, chili, hamburger patty, grilled chicken - Snacks:- He saw nutrition in 2016.  -+ CKD, last BUN/creatinine:  Lab Results  Component Value Date   BUN 17 05/16/2020   BUN 16 09/26/2019   CREATININE 1.56 (H) 05/16/2020   CREATININE 1.48 09/26/2019  On Cozaar 50.  -+ HL; last set of lipids: Lab Results  Component Value Date   CHOL 143 05/16/2020   HDL 53.20 05/16/2020   LDLCALC 69 05/16/2020   LDLDIRECT 81.0 08/31/2015   TRIG 107.0 05/16/2020   CHOLHDL 3 05/16/2020  On Crestor 10.  - last eye exam was in 10/2019: No DR  -No numbness and tingling in his feet.  He has a history of right calf ulcer- he required skin graft - 2020  Pt has no FH of DM.  Uncontrolled hypothyroidism:    Reviewed his TFTs: Lab Results  Component Value Date   TSH 4.61 (H) 05/16/2020   TSH 33.362 (  H) 09/03/2019   TSH 13.910 (H) 02/02/2019   TSH 25.834 (H) 01/31/2019   TSH 4.800 (H) 05/03/2018   TSH 3.39 11/26/2017   TSH 3.510 03/18/2017   TSH 3.51 08/31/2015   TSH 3.49 10/07/2013   TSH 8.38 (H) 04/26/2010   He takes levothyroxine 50 mcg daily: - in am - fasting - now drinks black coffee - at least 30 min from b'fast - no calcium - no iron - + multivitamins later in the day - no PPIs - not on Biotin  He has a history of HTN, OSA  At last visit he described that he drank 4-5 beers every day.  Previously was drinking hard liquor.  ROS: Constitutional: no weight gain/no weight loss, no fatigue, no subjective hyperthermia, no subjective hypothermia Eyes: no blurry vision, no xerophthalmia ENT: no sore  throat, + see HPI Cardiovascular: no CP/no SOB/no palpitations/no leg swelling Respiratory: no cough/no SOB/no wheezing Gastrointestinal: no N/no V/no D/no C/no acid reflux Musculoskeletal: no muscle aches/+ joint aches Skin: no rashes, no hair loss Neurological: no tremors/no numbness/no tingling/no dizziness  I reviewed pt's medications, allergies, PMH, social hx, family hx, and changes were documented in the history of present illness. Otherwise, unchanged from my initial visit note.  Past Medical History:  Diagnosis Date  . Asthma    as a teenager - does not use an inhaler although pt said he has an albuteral inhaler  . Atrial fibrillation ([place])    persistant 02/2009  . Atrial flutter ([place])    s/p CTI ablation 08/06/10  . Cataract   . Chronic rhinitis   . Colon polyp   . Congestive heart failure ([place])   . Diverticulosis    colonoscopy 04/03/2009  . Hyperlipidemia   . Hypertension   . Morbid obesity ([place])    target weight = 219  for BMI < 30  . Nonischemic cardiomyopathy ([facility])    tachycardia mediated  . Seasonal allergies   . Sleep apnea    original 2001 - wears C-PaP  . Type 2 diabetes mellitus ([place])    Past Surgical History:  Procedure Laterality Date  . ATRIAL FIBRILLATION ABLATION N/A 12/14/2018   Procedure: ATRIAL FIBRILLATION ABLATION;  Surgeon: Thompson Grayer, MD;  Location: [facility];  Service: Cardiovascular;  Laterality: N/A;  . atrial flutter ablation  08/06/10  . CARDIOVERSION N/A 07/05/2018   Procedure: CARDIOVERSION;  Surgeon: Buford Dresser, MD;  Location: [facility];  Service: Cardiovascular;  Laterality: N/A;  . COLONOSCOPY    . POLYPECTOMY    . TEE WITHOUT CARDIOVERSION N/A 12/14/2018   Procedure: TRANSESOPHAGEAL ECHOCARDIOGRAM (TEE);  Surgeon: Thompson Grayer, MD;  Location: [facility];  Service: Cardiovascular;  Laterality: N/A;   Social History   Socioeconomic History  . Marital status: Single    Spouse name: Not on file  .  Number of children: 0  . Years of education: Not on file  . Highest education level: Not on file  Occupational History  . Occupation: Sales  Tobacco Use  . Smoking status: Never Smoker  . Smokeless tobacco: Never Used  Vaping Use  . Vaping Use: Never used  Substance and Sexual Activity  . Alcohol use: Yes    Alcohol/week: 21.0 standard drinks    Types: 21 Cans of beer per week    Comment: previously quite heavy, working on cessation   . Drug use: No  . Sexual activity: Not on file  Other Topics Concern  . Not on file  Social History Narrative   Lives in [place] and works for [employer]   Social Determinants of Health   Financial Resource Strain: Not on file  Food Insecurity: Not on file  Transportation Needs: Not on file  Physical Activity: Not on file  Stress: Not on file  Social Connections: Not on file  Intimate Partner Violence: Not on file   Current Outpatient Medications on File Prior to Visit  Medication Sig Dispense Refill  . albuterol (PROVENTIL HFA;VENTOLIN HFA) 108 (90 Base) MCG/ACT inhaler Inhale 2 puffs into the lungs every 6 (six) hours as needed for wheezing or shortness of breath.    . B-D UF III MINI PEN NEEDLES 31G X 5 MM MISC 1 EACH BY OTHER ROUTE 4 (FOUR) TIMES DAILY. E11.9 100 each 4  . Continuous Blood Gluc Receiver (FREESTYLE LIBRE 14 DAY READER) DEVI USE AS DIRECTED 1 each 11  . Continuous Blood Gluc Sensor (FREESTYLE LIBRE 14 DAY SENSOR) MISC Use as directed by Diabetes educator 2 each 11  . dabigatran (PRADAXA) 150 MG CAPS capsule Take 1 capsule (150 mg total) by mouth 2 (two) times daily. 180 capsule 3  . doxylamine, Sleep, (UNISOM) 25 MG tablet Take 50 mg by mouth at bedtime as needed for sleep.     . folic acid (FOLVITE) 1 MG tablet Take 1 tablet (1 mg total) by mouth daily. 30 tablet 1  . furosemide (LASIX) 40 MG tablet TAKE 1 TABLET TWICE DAILY 180 tablet 1  . gabapentin (NEURONTIN) 300 MG capsule TAKE 1 CAPSULE BY MOUTH THREE TIMES A DAY  270 capsule 2  . insulin aspart (NOVOLOG) 100 UNIT/ML FlexPen Inject 8-12 Units into the skin 3 (three) times daily with meals. 30 mL 3  . levothyroxine (SYNTHROID) 50 MCG tablet TAKE 1 TABLET BY MOUTH DAILY BEFORE BREAKFAST 90 tablet 0  . losartan (COZAAR) 25 MG tablet Take 1 tablet (25 mg total) by mouth every morning. 90 tablet 3  . mometasone (NASONEX) 50 MCG/ACT nasal spray Place 2 sprays into the nose daily as needed (allergies or rhinitis).     [name] OZEMPIC, 0.25 OR 0.5 MG/DOSE, 2 MG/1.5ML SOPN INJECT 0.5 MG INTO THE SKIN ONCE A WEEK. 1.5 mL 5  . rosuvastatin (CRESTOR) 10 MG tablet TAKE 1 TABLET BY MOUTH EVERY DAY 90 tablet 3  . spironolactone (ALDACTONE) 25 MG tablet TAKE 1/2 TABLET BY MOUTH EVERY DAY 45 tablet 3  . SYMBICORT 80-4.5 MCG/ACT inhaler USE 2 INHALATIONS TWICE A DAY (Patient taking differently: Inhale 2 puffs into the lungs 2 (two) times daily as needed (for flares). ) 30.6 g 1  . thiamine 100 MG tablet Take 1 tablet (100 mg total) by mouth daily. 30 tablet 1  . TRESIBA FLEXTOUCH 200 UNIT/ML FlexTouch Pen INJECT 66 UNITS INTO THE SKIN DAILY. 9 mL 3  . vitamin C (ASCORBIC ACID) 500 MG tablet Take 500-1,000 mg by mouth daily.     No current facility-administered medications on file prior to visit.   No Known Allergies Family History  Problem Relation Age of Onset  . Hypertension Mother   . Kidney failure Mother        Due to sepsis  . COPD Father   . Colon cancer Brother        dx in 55's  . Esophageal cancer Neg Hx   . Rectal cancer Neg Hx   . Stomach cancer Neg Hx     PE: BP (!) 164/100 (BP Location: Right Arm, Patient Position: Sitting, Cuff  Size: Large)   Pulse 83   Ht 6\' 1"  (1.854 m)   Wt 288 lb (130.6 kg)   SpO2 95%   BMI 38.00 kg/m   Wt Readings from Last 3 Encounters:  06/29/20 288 lb (130.6 kg)  05/16/20 286 lb 12.8 oz (130.1 kg)  02/24/20 288 lb (130.6 kg)   Constitutional: overweight, in NAD Eyes: PERRLA, EOMI, no exophthalmos ENT: moist mucous  membranes, no thyromegaly, no cervical lymphadenopathy Cardiovascular: RRR, No MRG Respiratory: CTA B Gastrointestinal: abdomen soft, NT, ND, BS+ Musculoskeletal: no deformities, strength intact in all 4 Skin: moist, warm, no rashes Neurological: no tremor with outstretched hands, DTR normal in all 4  ASSESSMENT: 1. DM2, insulin-dependent, uncontrolled, with complications - CHF - A. fib/a flutter - CKD - h/o Diabetic ulcer of calf - h/o HHNK - admitted with a glucose of >1000  -started on basal bolus regimen in the hospital  2.  Hypothyroidism - Uncontrolled  3. Obesity class 2  4. HL  PLAN:  1. Patient with longstanding, uncontrolled, type 2 diabetes, insulin-dependent, on basal/bolus insulin regimen and also weekly GLP-1 receptor agonist.  His blood sugars improved dramatically at last visit after he stopped drinking alcohol.  An HbA1c decreased from 14.2% to 5.3% at last visit!!! -He does have a freestyle libre CGM, which he likes.  He pays out-of-pocket for it, approximately $120 per month -At last visit, reviewing his CGM tracings, the sugars were spectacularly improved, 97% at goal.  We decreased the dose of [place] and we discussed about reducing it further if sugars continue to improve to avoid low blood sugars. CGM interpretation: -At today's visit, we reviewed his CGM downloads: It appears that 87% of values are in target range (goal >70%), while 13% are higher than 180 (goal <25%), and 0% are lower than 70 (goal <4%).  The calculated average blood sugar is 134, higher than before.  The projected HbA1c for the next 3 months (GMI) is 6.5%, higher than before. -Reviewing the CGM trends, his sugars appear to be well controlled, however, they increase significantly after breakfast.  Upon questioning, he changed his breakfast from fruit to biscuits and pop tarts.  I showed him the effect of these foods on his blood sugars as strongly advised him to stop eating this.  We discussed  about healthier alternatives, either return to fruit, or include oatmeal with fruit and nuts, or, since he inquires about it, he can have low sugar cereals, but these are not preferred.  I did advise him to check the trends in his blood sugars and see which of these breakfast are raising his blood sugars the most.  -At today's visit, he had a low blood sugar, at 58 and felt hypoglycemic - increased sweating.  I gave him apple juice.  However, we also discussed about reducing his insulin doses.  Since this low blood sugar happened after delaying lunch, we will go ahead and decrease his Tresiba dose by 20%.  At next visit, I hope we can reduce it even further. - I suggested to:  Patient Instructions  Please continue: - Ozempic 0.5 mg weekly in a.m.  - Novolog 8-12 units based on the size of the meal  Please decrease: - Tresiba U200 40 units daily  Please continue Levothyroxine 50 mcg daily.  Take the thyroid hormone every day, with water, at least 30 minutes before breakfast, separated by at least 4 hours from: - acid reflux medications - calcium - iron - multivitamins  Please stop  at the lab.  Please return in 4 months.   - we checked his HbA1c: 5.7% (higher) - advised to check sugars at different times of the day - 4x a day, rotating check times - advised for yearly eye exams >> he is UTD - return to clinic in 4 months  2.  Hypothyroidism -Uncontrolled - latest thyroid labs reviewed with pt >> TSH was elevated at last check with PCP, but much improved from before: Lab Results  Component Value Date   TSH 4.61 (H) 05/16/2020   - he continues on LT4 50 mcg daily - pt feels good on this dose. - we discussed about taking the thyroid hormone every day, with water, >30 minutes before breakfast, separated by >4 hours from acid reflux medications, calcium, iron, multivitamins. Pt. is taking it correctly now.   - will check thyroid tests today: TSH and fT4 - If labs are abnormal, he will  need to return for repeat TFTs in 1.5 months  3. Obesity class 2 -He lost 15 pounds before last visit -We will continue the GLP-1 receptor agonist, which should also help with weight loss -Decreasing insulin should further help with this  4. HL - Reviewed latest lipid panel from last month, all fractions at goal Lab Results  Component Value Date   CHOL 143 05/16/2020   HDL 53.20 05/16/2020   LDLCALC 69 05/16/2020   LDLDIRECT 81.0 08/31/2015   TRIG 107.0 05/16/2020   CHOLHDL 3 05/16/2020  - Continues Crestor 10 without side effects.  Orders Placed This Encounter  Procedures  . TSH  . T4, free   Component     Latest Ref Rng & Units 06/29/2020  TSH     0.35 - 4.50 uIU/mL 3.52  T4,Free(Direct)     0.60 - 1.60 ng/dL 0.86  Normal TFTs. Philemon Kingdom, MD PhD [facility]

## 2020-06-29 NOTE — Addendum Note (Signed)
Addended by: Darlina Rumpf A on: 06/29/2020 02:25 PM   Modules accepted: Orders

## 2020-06-29 NOTE — Patient Instructions (Addendum)
Please continue: - Ozempic 0.5 mg weekly in a.m.  - Novolog 8-12 units based on the size of the meal  Please decrease: - Tresiba U200 40 units daily  Please continue Levothyroxine 50 mcg daily.  Take the thyroid hormone every day, with water, at least 30 minutes before breakfast, separated by at least 4 hours from: - acid reflux medications - calcium - iron - multivitamins  Please stop at the lab.  Please return in 4 months.

## 2020-07-06 ENCOUNTER — Ambulatory Visit (INDEPENDENT_AMBULATORY_CARE_PROVIDER_SITE_OTHER): Payer: Medicare HMO

## 2020-07-06 ENCOUNTER — Ambulatory Visit (INDEPENDENT_AMBULATORY_CARE_PROVIDER_SITE_OTHER): Payer: Medicare HMO | Admitting: Internal Medicine

## 2020-07-06 ENCOUNTER — Other Ambulatory Visit: Payer: Self-pay

## 2020-07-06 ENCOUNTER — Encounter: Payer: Self-pay | Admitting: Internal Medicine

## 2020-07-06 VITALS — BP 138/78 | HR 63 | Temp 98.1°F | Resp 18 | Ht 73.0 in | Wt 288.2 lb

## 2020-07-06 DIAGNOSIS — G4733 Obstructive sleep apnea (adult) (pediatric): Secondary | ICD-10-CM

## 2020-07-06 DIAGNOSIS — M79641 Pain in right hand: Secondary | ICD-10-CM | POA: Diagnosis not present

## 2020-07-06 DIAGNOSIS — M19041 Primary osteoarthritis, right hand: Secondary | ICD-10-CM | POA: Diagnosis not present

## 2020-07-06 MED ORDER — TRAMADOL HCL 50 MG PO TABS
50.0000 mg | ORAL_TABLET | Freq: Three times a day (TID) | ORAL | 0 refills | Status: AC | PRN
Start: 2020-07-06 — End: 2020-07-11

## 2020-07-06 NOTE — Assessment & Plan Note (Signed)
Checking x-ray to quantify arthritis and assess for occult fracture. Does not appear consistent with RA or gout. Rx tramadol for pain and may need referral to sports medicine for injection.

## 2020-07-06 NOTE — Patient Instructions (Signed)
We will check an x-ray of the hand today and sent in tramadol for pain to use up to twice a day as needed.

## 2020-07-06 NOTE — Progress Notes (Signed)
   Subjective:   Patient ID: [name], male    DOB: 08-21-1955, 65 y.o.   MRN: [id number]  HPI The patient is a 65 YO man coming in for right whist and hand pain. Started about 1 month ago without injury or overuse. Pain is severe at times and limiting his ROM and activity. He is taking tylenol which does not help and tried voltaren gel which has not helped much. Denies swelling or redness of the hand/wrist. Does have prior fracture in the wrist in the early 90s.   Review of Systems  Constitutional: Negative.   HENT: Negative.   Eyes: Negative.   Respiratory: Negative for cough, chest tightness and shortness of breath.   Cardiovascular: Negative for chest pain, palpitations and leg swelling.  Gastrointestinal: Negative for abdominal distention, abdominal pain, constipation, diarrhea, nausea and vomiting.  Musculoskeletal: Positive for arthralgias and myalgias.  Skin: Negative.   Neurological: Negative.   Psychiatric/Behavioral: Negative.     Objective:  Physical Exam Constitutional:      Appearance: He is well-developed and well-nourished.  HENT:     Head: Normocephalic and atraumatic.  Eyes:     Extraocular Movements: EOM normal.  Cardiovascular:     Rate and Rhythm: Normal rate and regular rhythm.  Pulmonary:     Effort: Pulmonary effort is normal. No respiratory distress.     Breath sounds: Normal breath sounds. No wheezing or rales.  Abdominal:     General: Bowel sounds are normal. There is no distension.     Palpations: Abdomen is soft.     Tenderness: There is no abdominal tenderness. There is no rebound.  Musculoskeletal:        General: Tenderness present. No edema.     Cervical back: Normal range of motion.     Comments: Pain along the base 1,2 fingers and in the wrist right. Without swelling or redness.   Skin:    General: Skin is warm and dry.  Neurological:     Mental Status: He is alert and oriented to person, place, and time.     Coordination:  Coordination normal.  Psychiatric:        Mood and Affect: Mood and affect normal.     Vitals:   07/06/20 1417  BP: 138/78  Pulse: 63  Resp: 18  Temp: 98.1 F (36.7 C)  TempSrc: Oral  SpO2: 96%  Weight: 288 lb 3.2 oz (130.7 kg)  Height: 6\' 1"  (1.854 m)    This visit occurred during the SARS-CoV-2 public health emergency.  Safety protocols were in place, including screening questions prior to the visit, additional usage of staff PPE, and extensive cleaning of exam room while observing appropriate contact time as indicated for disinfecting solutions.   Assessment & Plan:

## 2020-07-06 NOTE — Assessment & Plan Note (Signed)
Ordered CPAP again as his is not working and DME company gave him a hard time with recent order.

## 2020-07-11 NOTE — Progress Notes (Signed)
Subjective:   I, Wendy Poet, LAT, ATC, am serving as scribe for Dr. Lynne Leader.  I'm seeing this patient as a consultation for Dr. [name]. Note will be routed back to referring provider/PCP.  CC: Right hand pain  HPI: Pt is a 65 y/o male c/o R wrist and hand pain ongoing for about 6 weeks  w/ no known MOI. Pt locates pain to his R thumb and and radial wrist and forearm . Pt has a prior hx of R wrist fx in the early 1990's.   Numbness/tingling: No R wrist/thumb swelling: No Mechanical symptoms: No Aggravates: R wrist radial deviation; R thumb AROM Rx tried: Tylenol, Voltaren gel, tramadol  Dx imaging: 07/06/20 R hand XR  Past medical history, Surgical history, Family history, Social history, Allergies, and medications have been entered into the medical record, reviewed.   Review of Systems: No new headache, visual changes, nausea, vomiting, diarrhea, constipation, dizziness, abdominal pain, skin rash, fevers, chills, night sweats, weight loss, swollen lymph nodes, body aches, joint swelling, muscle aches, chest pain, shortness of breath, mood changes, visual or auditory hallucinations.   Objective:    Vitals:   07/12/20 1249  BP: 140/86  Pulse: (!) 111  SpO2: 98%   General: Well Developed, well nourished, and in no acute distress.  Neuro/Psych: Alert and oriented x3, extra-ocular muscles intact, able to move all 4 extremities, sensation grossly intact. Skin: Warm and dry, no rashes noted.  Respiratory: Not using accessory muscles, speaking in full sentences, trachea midline.  Cardiovascular: Pulses palpable, no extremity edema. Abdomen: Does not appear distended. MSK: Right wrist normal-appearing tender palpation radial styloid.  Normal wrist motion pain with radial and ulnar deviation. Positive Finkelstein's test.  Lab and Radiology Results  Procedure: Real-time Ultrasound Guided Injection of right wrist first dorsal wrist compartment (de Quervain's  injection) Device: Philips Affiniti 50G Images permanently stored and available for review in PACS Inspection with ultrasound reveals severe tenosynovitis consistent with de Quervain's Verbal informed consent obtained.  Discussed risks and benefits of procedure. Warned about infection bleeding damage to structures skin hypopigmentation and fat atrophy among others. Patient expresses understanding and agreement Time-out conducted.   Noted no overlying erythema, induration, or other signs of local infection.   Skin prepped in a sterile fashion.   Local anesthesia: Topical Ethyl chloride.   With sterile technique and under real time ultrasound guidance:  40 mg of Kenalog and 1 mL of lidocaine injected into the first dorsal wrist compartment tendon sheath. Fluid seen entering the tendon sheath.   Completed without difficulty   Pain immediately resolved suggesting accurate placement of the medication.   Advised to call if fevers/chills, erythema, induration, drainage, or persistent bleeding.   Images permanently stored and available for review in the ultrasound unit.  Impression: Technically successful ultrasound guided injection.        Impression and Recommendations:    Assessment and Plan: 65 y.o. male with de Quervain's tenosynovitis.  Already failing early conservative management with relative rest and Voltaren gel.  Plan to treat with injection and thumb spica splint.  Additionally proceed with home exercise program.  Recheck back if not improving.[name]  PDMP not reviewed this encounter. Orders Placed This Encounter  Procedures  . [place] LIMITED JOINT SPACE STRUCTURES UP RIGHT(NO LINKED CHARGES)    Order Specific Question:   Reason for Exam (SYMPTOM  OR DIAGNOSIS REQUIRED)    Answer:   R thumb pain    Order Specific Question:   Preferred imaging  location?    Answer:   [place]   No orders of the defined types were placed in this encounter.   Discussed  warning signs or symptoms. Please see discharge instructions. Patient expresses understanding.   The above documentation has been reviewed and is accurate and complete Lynne Leader, M.D.

## 2020-07-12 ENCOUNTER — Ambulatory Visit: Payer: Self-pay

## 2020-07-12 ENCOUNTER — Ambulatory Visit: Payer: Medicare HMO | Admitting: Family Medicine

## 2020-07-12 ENCOUNTER — Encounter: Payer: Self-pay | Admitting: Family Medicine

## 2020-07-12 ENCOUNTER — Other Ambulatory Visit: Payer: Self-pay

## 2020-07-12 VITALS — BP 140/86 | HR 111 | Ht 73.0 in | Wt 290.0 lb

## 2020-07-12 DIAGNOSIS — G8929 Other chronic pain: Secondary | ICD-10-CM

## 2020-07-12 DIAGNOSIS — M654 Radial styloid tenosynovitis [de Quervain]: Secondary | ICD-10-CM | POA: Diagnosis not present

## 2020-07-12 DIAGNOSIS — M79644 Pain in right finger(s): Secondary | ICD-10-CM | POA: Diagnosis not present

## 2020-07-12 NOTE — Patient Instructions (Addendum)
You had a R thumb injection today.  Call or go to the ER if you develop a large red swollen joint with extreme pain or oozing puss.    De Quervain's Tenosynovitis  De Quervain's tenosynovitis is a condition that causes inflammation of the tendon on the thumb side of the wrist. Tendons are cords of tissue that connect bones to muscles. The tendons in the hand pass through a tunnel called a sheath. A slippery layer of tissue (synovium) lets the tendons move smoothly in the sheath. With de Quervain's tenosynovitis, the sheath swells or thickens, causing friction and pain. The condition is also called de Quervain's disease and de Quervain's syndrome. It occurs most often in women who are 39-82 years old. What are the causes? The exact cause of this condition is not known. It may be associated with overuse of the hand and wrist. What increases the risk? You are more likely to develop this condition if you:  Use your hands far more than normal, especially if you repeat certain movements that involve twisting your hand or using a tight grip.  Are pregnant.  Are a middle-aged woman.  Have rheumatoid arthritis.  Have diabetes. What are the signs or symptoms? The main symptom of this condition is pain on the thumb side of the wrist. The pain may get worse when you grasp something or turn your wrist. Other symptoms may include:  Pain that extends up the forearm.  Swelling of your wrist and hand.  Trouble moving the thumb and wrist.  A sensation of snapping in the wrist.  A bump filled with fluid (cyst) in the area of the pain. How is this diagnosed? This condition may be diagnosed based on:  Your symptoms and medical history.  A physical exam. During the exam, your health care provider may do a simple test Wynn Maudlin test) that involves pulling your thumb and wrist to see if this causes pain. You may also need to have an X-ray or ultrasound. How is this treated? Treatment for this  condition may include:  Avoiding any activity that causes pain and swelling.  Taking medicines. Anti-inflammatory medicines and corticosteroid injections may be used to reduce inflammation and relieve pain.  Wearing a splint.  Having surgery. This may be needed if other treatments do not work. Once the pain and swelling have gone down, you may start:  Physical therapy. This includes exercises to improve movement and strength in your wrist and thumb.  Occupational therapy. This includes adjusting how you move your wrist. Follow these instructions at home: If you have a splint:  Wear the splint as told by your health care provider. Remove it only as told by your health care provider.  Loosen the splint if your fingers tingle, become numb, or turn cold and blue.  Keep the splint clean.  If the splint is not waterproof: ? Do not let it get wet. ? Cover it with a watertight covering when you take a bath or a shower. Managing pain, stiffness, and swelling  Avoid movements and activities that cause pain and swelling in the wrist area.  If directed, put ice on the painful area. This may be helpful after doing activities that involve the sore wrist. To do this: ? Put ice in a plastic bag. ? Place a towel between your skin and the bag. ? Leave the ice on for 20 minutes, 2-3 times a day. ? Remove the ice if your skin turns bright red. This is very important. If  you cannot feel pain, heat, or cold, you have a greater risk of damage to the area.  Move your fingers often to reduce stiffness and swelling.  Raise (elevate) the injured area above the level of your heart while you are sitting or lying down.   General instructions  Return to your normal activities as told by your health care provider. Ask your health care provider what activities are safe for you.  Take over-the-counter and prescription medicines only as told by your health care provider.  Keep all follow-up visits. This is  important. Contact a health care provider if:  Your pain medicine does not help.  Your pain gets worse.  You develop new symptoms. Summary  De Quervain's tenosynovitis is a condition that causes inflammation of the tendon on the thumb side of the wrist.  The condition occurs most often in women who are 47-69 years old.  The exact cause of this condition is not known. It may be associated with overuse of the hand and wrist.  Treatment starts with avoiding activity that causes pain or swelling in the wrist area. Other treatments may include wearing a splint and taking medicine. Sometimes, surgery is needed. This information is not intended to replace advice given to you by your health care provider. Make sure you discuss any questions you have with your health care provider. Document Revised: 09/14/2019 Document Reviewed: 09/14/2019 Elsevier Patient Education  2021 [employer].

## 2020-07-13 ENCOUNTER — Telehealth: Payer: Self-pay | Admitting: Internal Medicine

## 2020-07-13 NOTE — Telephone Encounter (Signed)
Pt called requesting we send a 1 year PA form to [facility] for pt's freestyle libre 14 day sensor. Pt asked to be notified via MyChart when this has been done.

## 2020-07-17 DIAGNOSIS — E785 Hyperlipidemia, unspecified: Secondary | ICD-10-CM

## 2020-07-17 DIAGNOSIS — E1169 Type 2 diabetes mellitus with other specified complication: Secondary | ICD-10-CM

## 2020-07-17 NOTE — Telephone Encounter (Signed)
Pt contacted via mychart regarding prior auth initiated.

## 2020-07-30 ENCOUNTER — Telehealth: Payer: Self-pay | Admitting: Cardiology

## 2020-07-30 NOTE — Telephone Encounter (Signed)
Spoke with patient of Dr. Harrell Gave. He reports he sent in a MyChart message this morning about being back in AFib but I cannot locate this.  He states for about 1 week he has felt like he is back in AFib. He reports shortness of breath with minimal exertion   He reports his vitals were "fine" at his sports medicine doctor's appointment last week but has no readings to provide  Called patient back to arrange visit with MD Will add patient on to see MD 07/31/20 @ 3:40pm Left message for patient with appointment information

## 2020-07-30 NOTE — Telephone Encounter (Signed)
Pt c/o Shortness Of Breath: STAT if SOB developed within the last 24 hours or pt is noticeably SOB on the phone  1. Are you currently SOB (can you hear that pt is SOB on the phone)? no  2. How long have you been experiencing SOB? A couple days   3. Are you SOB when sitting or when up moving around? With activity  4. Are you currently experiencing any other symptoms? Patient feels like he is back in afib  Patient c/o Palpitations:  High priority if patient c/o lightheadedness, shortness of breath, or chest pain  1) How long have you had palpitations/irregular HR/ Afib? Are you having the symptoms now? saturday  2) Are you currently experiencing lightheadedness, SOB or CP?   3) Do you have a history of afib (atrial fibrillation) or irregular heart rhythm? Yes   4) Have you checked your BP or HR? (document readings if available): no  5) Are you experiencing any other symptoms? SOB

## 2020-07-31 ENCOUNTER — Other Ambulatory Visit: Payer: Self-pay

## 2020-07-31 ENCOUNTER — Encounter: Payer: Self-pay | Admitting: Cardiology

## 2020-07-31 ENCOUNTER — Ambulatory Visit (INDEPENDENT_AMBULATORY_CARE_PROVIDER_SITE_OTHER): Payer: Medicare HMO | Admitting: Cardiology

## 2020-07-31 VITALS — BP 130/100 | HR 123 | Ht 73.0 in | Wt 293.6 lb

## 2020-07-31 DIAGNOSIS — I5022 Chronic systolic (congestive) heart failure: Secondary | ICD-10-CM

## 2020-07-31 DIAGNOSIS — E785 Hyperlipidemia, unspecified: Secondary | ICD-10-CM

## 2020-07-31 DIAGNOSIS — D6869 Other thrombophilia: Secondary | ICD-10-CM

## 2020-07-31 DIAGNOSIS — I10 Essential (primary) hypertension: Secondary | ICD-10-CM | POA: Diagnosis not present

## 2020-07-31 DIAGNOSIS — E1169 Type 2 diabetes mellitus with other specified complication: Secondary | ICD-10-CM | POA: Diagnosis not present

## 2020-07-31 DIAGNOSIS — Z01812 Encounter for preprocedural laboratory examination: Secondary | ICD-10-CM

## 2020-07-31 DIAGNOSIS — I428 Other cardiomyopathies: Secondary | ICD-10-CM

## 2020-07-31 DIAGNOSIS — I480 Paroxysmal atrial fibrillation: Secondary | ICD-10-CM

## 2020-07-31 MED ORDER — METOPROLOL TARTRATE 25 MG PO TABS: 25.0000 mg | ORAL_TABLET | Freq: Two times a day (BID) | ORAL | 3 refills | Status: DC | PRN

## 2020-07-31 NOTE — H&P (View-Only) (Signed)
Cardiology Office Note:    Date:  07/31/2020   ID:  [name], DOB 1955/11/01, MRN [id number]  PCP:  [name], MD  Cardiologist:  Buford Dresser, MD PhD  Referring MD: [name], *   CC: follow up  History of Present Illness:    [name] is a 65 y.o. male with a hx of persistent atrial fibrillation, atrial flutter with prior ablation in 2012, prior cardioversions, HTN, chronic systolic heart failure (thought to be tachycardia induced) with EF 30-35% on tte 04/2018, OSA who is seen in follow up today. I first saw him as a new consult 12/28/18.  Cardiac history: 12/14/18 Was planned for afib ablation. However, TEE performed was concerning for thrombus. Also noted to have EF 30%, severe biatrial enlargement. 04/2018: TTE, EF 30-35%, diffuse hypokinesis, biatrial enlargement  Today: Feels like he has been back in afib for several days. Feels short of breath without much exertion, was severely symptomatic starting yesterday AM. Last seen by Dr. Rayann Heman 12/14/19. Was doing well in sinus rhythm at the time, offered to revisit ablation but planned to continue with medical therapy.  Has been having dry heaves for the last 6-7 days, started several days before the afib. No fevers/chills. Has been able to keep medications down. No recent change to ozempic dose. No missed doses of anticoagulation in the last few weeks.   He is visibly uncomfortable and short of breath today. He is in afib in the 120s. We discussed multiple options for management, see below.  Denies chest pain, PND, orthopnea, LE edema or unexpected weight gain. No syncope.  Past Medical History:  Diagnosis Date  . Asthma    as a teenager - does not use an inhaler although pt said he has an albuteral inhaler  . Atrial fibrillation ([place])    persistant 02/2009  . Atrial flutter ([place])    s/p CTI ablation 08/06/10  . Cataract   . Chronic rhinitis   . Colon polyp   . Congestive heart  failure ([place])   . Diverticulosis    colonoscopy 04/03/2009  . Hyperlipidemia   . Hypertension   . Morbid obesity ([place])    target weight = 219  for BMI < 30  . Nonischemic cardiomyopathy ([facility])    tachycardia mediated  . Seasonal allergies   . Sleep apnea    original 2001 - wears C-PaP  . Type 2 diabetes mellitus ([place])     Past Surgical History:  Procedure Laterality Date  . ATRIAL FIBRILLATION ABLATION N/A 12/14/2018   Procedure: ATRIAL FIBRILLATION ABLATION;  Surgeon: Thompson Grayer, MD;  Location: [facility] [facility];  Service: Cardiovascular;  Laterality: N/A;  . atrial flutter ablation  08/06/10  . CARDIOVERSION N/A 07/05/2018   Procedure: CARDIOVERSION;  Surgeon: Buford Dresser, MD;  Location: [facility] ENDOSCOPY;  Service: Cardiovascular;  Laterality: N/A;  . COLONOSCOPY    . POLYPECTOMY    . TEE WITHOUT CARDIOVERSION N/A 12/14/2018   Procedure: TRANSESOPHAGEAL ECHOCARDIOGRAM (TEE);  Surgeon: Thompson Grayer, MD;  Location: [place] [facility];  Service: Cardiovascular;  Laterality: N/A;    Current Medications: Current Outpatient Medications on File Prior to Visit  Medication Sig  . albuterol (PROVENTIL HFA;VENTOLIN HFA) 108 (90 Base) MCG/ACT inhaler Inhale 2 puffs into the lungs every 6 (six) hours as needed for wheezing or shortness of breath.  . B-D UF III MINI PEN NEEDLES 31G X 5 MM MISC 1 EACH BY OTHER ROUTE 4 (FOUR) TIMES DAILY. E11.9  .  Continuous Blood Gluc Receiver (FREESTYLE LIBRE 14 DAY READER) DEVI USE AS DIRECTED  . Continuous Blood Gluc Sensor (FREESTYLE LIBRE 14 DAY SENSOR) MISC Use as directed by Diabetes educator  . dabigatran (PRADAXA) 150 MG CAPS capsule Take 1 capsule (150 mg total) by mouth 2 (two) times daily.  [name] doxylamine, Sleep, (UNISOM) 25 MG tablet Take 50 mg by mouth at bedtime as needed for sleep.   . furosemide (LASIX) 40 MG tablet TAKE 1 TABLET TWICE DAILY  . insulin aspart (NOVOLOG) 100 UNIT/ML FlexPen Inject 8-12 Units into the skin 3 (three) times  daily with meals.  . insulin degludec (TRESIBA FLEXTOUCH) 200 UNIT/ML FlexTouch Pen Inject 40 Units into the skin daily.  [name] levothyroxine (SYNTHROID) 50 MCG tablet TAKE 1 TABLET BY MOUTH DAILY BEFORE BREAKFAST  . losartan (COZAAR) 25 MG tablet Take 1 tablet (25 mg total) by mouth every morning.  . mometasone (NASONEX) 50 MCG/ACT nasal spray Place 2 sprays into the nose daily as needed (allergies or rhinitis).  [name] OZEMPIC, 0.25 OR 0.5 MG/DOSE, 2 MG/1.5ML SOPN INJECT 0.5 MG INTO THE SKIN ONCE A WEEK.  . rosuvastatin (CRESTOR) 10 MG tablet TAKE 1 TABLET BY MOUTH EVERY DAY  . spironolactone (ALDACTONE) 25 MG tablet TAKE 1/2 TABLET BY MOUTH EVERY DAY  . SYMBICORT 80-4.5 MCG/ACT inhaler USE 2 INHALATIONS TWICE A DAY (Patient taking differently: Inhale 2 puffs into the lungs 2 (two) times daily as needed (for flares).)  . thiamine 100 MG tablet Take 1 tablet (100 mg total) by mouth daily.  . vitamin C (ASCORBIC ACID) 500 MG tablet Take 500-1,000 mg by mouth daily.  [name] gabapentin (NEURONTIN) 300 MG capsule TAKE 1 CAPSULE BY MOUTH THREE TIMES A DAY (Patient not taking: Reported on 07/31/2020)   No current facility-administered medications on file prior to visit.     Allergies:   Patient has no known allergies.   Social History   Tobacco Use  . Smoking status: Never Smoker  . Smokeless tobacco: Never Used  Vaping Use  . Vaping Use: Never used  Substance Use Topics  . Alcohol use: Yes    Alcohol/week: 21.0 standard drinks    Types: 21 Cans of beer per week    Comment: previously quite heavy, working on cessation   . Drug use: No     Family History: The patient's family history includes COPD in his father; Colon cancer in his brother; Hypertension in his mother; Kidney failure in his mother. There is no history of Esophageal cancer, Rectal cancer, or Stomach cancer.  ROS:   Please see the history of present illness.  Additional pertinent ROS otherwise unremarkable.  EKGs/Labs/Other Studies  Reviewed:    The following studies were reviewed today: TTE September 16, 2019 1. Left ventricular ejection fraction, by estimation, is 40 to 45%. The  left ventricle has mildly decreased function. The left ventricle  demonstrates global hypokinesis. There is severe left ventricular  hypertrophy, asymmetric and septal predominant  (parasternal views of the septum raise question of HOCM or infiltrative  cardiomyopathy - cardiac MRI could be considered if clinically  appropriate). Left ventricular diastolic parameters are consistent with  Grade I diastolic dysfunction (impaired  relaxation).  2. Right ventricular systolic function is normal. The right ventricular  size is normal. Tricuspid regurgitation signal is inadequate for assessing  PA pressure.  3. Left atrial size was mildly dilated.  4. The mitral valve is grossly normal. Trivial mitral valve  regurgitation.  5. The aortic valve was not well visualized.  Aortic valve regurgitation  is not visualized.  6. Aortic dilatation noted. There is mild dilatation of the ascending  aorta.  7. The inferior vena cava is normal in size with greater than 50%  respiratory variability, suggesting right atrial pressure of 3 mmHg.   Lexiscan 04/07/19  Nuclear stress EF: 49%.  The left ventricular ejection fraction is mildly decreased (45-54%).  No T wave inversion was noted during stress.  There was no ST segment deviation noted during stress.  Defect 1: There is a small defect of mild severity.  This is an intermediate risk study.   Small size, mild intensity fixed apical perfusion defect, likely thinning. No significant reversible ischemia. Dilated LV with mild hypokinesis - LVEF 49%. This is an intermediate risk study due to decreased LV function.  TTE 05/03/18 Left ventricle: The cavity size was normal. There was severe   concentric hypertrophy. Systolic function was moderately to   severely reduced. The estimated ejection fraction was  in the   range of 30% to 35%. Diffuse hypokinesis. The study was not   technically sufficient to allow evaluation of LV diastolic   dysfunction due to atrial fibrillation. - Aortic valve: There was no regurgitation. - Aortic root: The aortic root was normal in size. - Mitral valve: There was trivial regurgitation. - Left atrium: The atrium was severely dilated. - Right ventricle: Systolic function was normal. - Right atrium: The atrium was moderately dilated. - Tricuspid valve: There was trivial regurgitation. - Pulmonic valve: Transvalvular velocity was within the normal   range. There was no evidence for stenosis. - Pulmonary arteries: Systolic pressure was within the normal   range. - Inferior vena cava: The vessel was normal in size. The   respirophasic diameter changes were in the normal range (= 50%),   consistent with normal central venous pressure. - Pericardium, extracardiac: There was no pericardial effusion.  Impressions: - When compared to the prior study from 04/02/2017 LVEF has   decreased from 55-60% to 30-35% with diffuse hypokinesis.  TEE 12/14/18  1. The left ventricle has moderate-severely reduced systolic function, with an ejection fraction of 30-35%. The cavity size was moderately dilated. Left ventrical global hypokinesis without regional wall motion abnormalities.  2. The right ventricle has normal systolc function. The cavity was normal. There is no increase in right ventricular wall thickness.  3. Left atrial size was severely dilated.  4. Dense smoke in atria and appendage. Stagnant pre thrombus and likely thrombus in mid to distal LAA High risk for embolic event EP procedure cancelled.  5. Right atrial size was moderately dilated.  6. The mitral valve is grossly normal. Mild thickening of the mitral valve leaflet. Mild calcification of the mitral valve leaflet. Mitral valve regurgitation is mild to moderate by color flow Doppler.  7. Restricted posterior  leaflet motion.  8. The aortic valve is tricuspid Moderate thickening of the aortic valve Sclerosis without any evidence of stenosis of the aortic valve. Aortic valve regurgitation is trivial by color flow Doppler.  9. The aortic root is normal in size and structure.  EKG:  EKG is personally reviewed.  The ekg ordered today demonstrates atrial fibrillation with RVR at 123 bpm, IVCD Recent Labs: 09/26/2019: Magnesium 1.9 05/16/2020: ALT 23; BUN 17; Creatinine, Ser 1.56; Hemoglobin 17.6; Platelets 210.0; Potassium 3.7; Sodium 139 06/29/2020: TSH 3.52  Recent Lipid Panel    Component Value Date/Time   CHOL 143 05/16/2020 1035   TRIG 107.0 05/16/2020 1035   HDL 53.20 05/16/2020  1035   CHOLHDL 3 05/16/2020 1035   VLDL 21.4 05/16/2020 1035   LDLCALC 69 05/16/2020 1035   LDLDIRECT 81.0 08/31/2015 0905    Physical Exam:    VS:  BP (!) 130/100   Pulse (!) 123   Ht 6\' 1"  (1.854 m)   Wt 293 lb 9.6 oz (133.2 kg)   SpO2 98%   BMI 38.74 kg/m     Wt Readings from Last 3 Encounters:  07/31/20 293 lb 9.6 oz (133.2 kg)  07/12/20 290 lb (131.5 kg)  07/06/20 288 lb 3.2 oz (130.7 kg)   GEN: Well nourished, well developed. Conversationally dyspneic HEENT: Normal, moist mucous membranes NECK: No JVD CARDIAC: tachycardic, irregularly irregular rhythm, normal S1 and S2, no rubs or gallops. No murmur. VASCULAR: Radial and DP pulses 2+ bilaterally. No carotid bruits RESPIRATORY:  Clear to auscultation without rales, wheezing or rhonchi  ABDOMEN: Soft, non-tender, non-distended MUSCULOSKELETAL:  Ambulates independently SKIN: Warm and dry, no edema NEUROLOGIC:  Alert and oriented x 3. No focal neuro deficits noted. PSYCHIATRIC:  Normal affect   ASSESSMENT:    1. Paroxysmal atrial fibrillation with RVR ([place])   2. Pre-procedure lab exam   3. Paroxysmal atrial fibrillation ([facility])   4. NICM (nonischemic cardiomyopathy) ([place])   5. Acquired thrombophilia ([place])   6. Chronic systolic heart failure  ([place])   7. Essential hypertension   8. Type 2 diabetes mellitus with hyperlipidemia (H[facility])    PLAN:    Paroxysmal atrial fibrillation, with RVR today History of atrial fibrillation and atrial flutter, s/p prior cardioversions and CTI -CHADSVASC=3 -Has OSA, encouraged adherence to CPAP -Bradycardia with amiodarone, carvedilol, though he had other medical issues at the time of significant bradycardia -Continue dabigatran, denies missed doses in the last few weeks. -he had TEE pre EP procedure 12/14/2018 with stagnant flow and likely thrombus in the LAA. Ablation cancelled. Was on rivaroxaban prior to the TEE, changed to dabigatran after. Given the history, I would recommend TEE prior to cardioversion to fully exclude LV thrombus. If no thrombus, can then proceed with cardioverison -he has a history of bradycardia but is very rapid today. I will given him temporary short acting metoprolol PRN for rate control. Ideally he can stop this once he is back in sinus rhythm -if he cannot hold sinus with a cardioversion, will need to discuss next steps with Dr. Rayann Heman. Advantage would be that he has already had TEE to discuss ablation if needed -pre procedure labs, orders done  Nonischemic cardiomyopathy, with chronic systolic and diastolic heart failure:  -euvolemic, NYHA class I -had planned for [facility], then had asymptomatic covid positive test, then renal failure. Lexiscan done, no reversible ischemia. -he has tolerated losartan and spironolactone -Continue 40 mg furosemide BID, no edema today -repeat echo 09/04/19 with EF 40-45% -discussed cMRI, declines -discussed entresto, declines. Would like to stay on current medications -on GLP1RA given diabetes. Followed by Dr. Renne Crigler in endocrinology.  Medication history: -amiodarone and carvedilol stopped during admission 08/2019 due to symptomatic bradycardia, in the setting of hyperosmolar hyperglycemia. -off hydralazine 50 mg BID, imdur 30 mg daily due to  hypotension  Hypertension: -continue medications as above  Type II diabetes with hyperlipidemia: -on semaglutide (GLP1RA) and insulin -continue rosuvastatin -no aspirin as he is on dabigatran  Cardiac risk counseling and prevention recommendations: -recommend heart healthy/Mediterranean diet, with whole grains, fruits, vegetable, fish, lean meats, nuts, and olive oil. Limit salt. -recommend moderate walking, 3-5 times/week for 30-50 minutes each session. Aim for at  least 150 minutes.week. Goal should be pace of 3 miles/hours, or walking 1.5 miles in 30 minutes -recommend avoidance of tobacco products. Avoid excess alcohol. -ASCVD risk score: The 10-year ASCVD risk score Mikey Bussing DC Jr., et al., 2013) is: 21.3%   Values used to calculate the score:     Age: 4 years     Sex: Male     Is Non-Hispanic African American: No     Diabetic: Yes     Tobacco smoker: No     Systolic Blood Pressure: 076 mmHg     Is BP treated: Yes     HDL Cholesterol: 53.2 mg/dL     Total Cholesterol: 143 mg/dL    Plan for follow up: 1 month if he holds sinus rhythm, otherwise will need to have him seen sooner  Total time of encounter: 45 minutes total time of encounter, including 31 minutes spent in face-to-face patient care. This time includes coordination of care and counseling regarding options for management of atrial fibrillation. Remainder of non-face-to-face time involved reviewing chart documents/testing relevant to the patient encounter and documentation in the medical record.  Buford Dresser, MD, PhD, [facility]   Medication Adjustments/Labs and Tests Ordered: Current medicines are reviewed at length with the patient today.  Concerns regarding medicines are outlined above.  Orders Placed This Encounter  Procedures  . Basic metabolic panel  . CBC  . EKG 12-Lead   Meds ordered this encounter  Medications  . metoprolol tartrate (LOPRESSOR) 25 MG tablet    Sig: Take  1 tablet (25 mg total) by mouth 2 (two) times daily as needed (heart rate greater than 90).    Dispense:  30 tablet    Refill:  3    Patient Instructions  Medication Instructions:  Take Metoprolol 25 mg twice a day as needed for heart greater than 90  *If you need a refill on your cardiac medications before your next appointment, please call your pharmacy*   Lab Work: Your physician recommends lab work today (CBC, BMP).  If you have labs (blood work) drawn today and your tests are completely normal, you will receive your results only by: [name] MyChart Message (if you have MyChart) OR . A paper copy in the mail If you have any lab test that is abnormal or we need to change your treatment, we will call you to review the results.   Testing/Procedures: [place] will need to have the coronavirus test completed prior to your procedure. An appointment has been made at 12:05 pm on Wednesday 08/01/20. This is a Drive Up Visit at [address], [place], [place] [address]. Please tell them that you are there for procedure testing. Stay in your car and someone will be with you shortly. Please make sure to have all other labs completed before this test because you will need to stay quarantined until your procedure.   Follow-Up: At [facility], you and your health needs are our priority.  As part of our continuing mission to provide you with exceptional heart care, we have created designated Provider Care Teams.  These Care Teams include your primary Cardiologist (physician) and Advanced Practice Providers (APPs -  Physician Assistants and Nurse Practitioners) who all work together to provide you with the care you need, when you need it.  We recommend signing up for the patient portal called "MyChart".  Sign up information is provided on this After Visit Summary.  MyChart is used  to connect with patients for Virtual Visits (Telemedicine).  Patients are able to view  lab/test results, encounter notes, upcoming appointments, etc.  Non-urgent messages can be sent to your provider as well.   To learn more about what you can do with MyChart, go to [email or web address].    Your next appointment:   1 month(s)  The format for your next appointment:   In Person  Provider:   Buford Dresser, MD   You are scheduled for a TEE Cardioversion on Friday 08/03/20 with Dr. Sallyanne Kuster.  Please arrive at the [facility] (Main Entrance A) at [facility]: [address] [place], [place] [address] at 12 pm.   DIET: Nothing to eat or drink after midnight except a sip of water with medications (see medication instructions below)  Hold: Insulin the night before and morning of procedure  Continue your anticoagulant: Pradaxa  You will need to continue your anticoagulant after your procedure until you  are told by your  Provider that it is safe to stop   Labs:  Today (BMP, CBC)  You must have a responsible person to drive you home and stay in the waiting area during your procedure. Failure to do so could result in cancellation.  Bring your insurance cards.  *Special Note: Every effort is made to have your procedure done on time. Occasionally there are emergencies that occur at the hospital that may cause delays. Please be patient if a delay does occur.      Signed, Buford Dresser, MD PhD 07/31/2020  [place]

## 2020-07-31 NOTE — Patient Instructions (Addendum)
Medication Instructions:  Take Metoprolol 25 mg twice a day as needed for heart greater than 90  *If you need a refill on your cardiac medications before your next appointment, please call your pharmacy*   Lab Work: Your physician recommends lab work today (CBC, BMP).  If you have labs (blood work) drawn today and your tests are completely normal, you will receive your results only by: [name] MyChart Message (if you have MyChart) OR . A paper copy in the mail If you have any lab test that is abnormal or we need to change your treatment, we will call you to review the results.   Testing/Procedures: [place] will need to have the coronavirus test completed prior to your procedure. An appointment has been made at 12:05 pm on Wednesday 08/01/20. This is a Drive Up Visit at [address], [place], [place] [address]. Please tell them that you are there for procedure testing. Stay in your car and someone will be with you shortly. Please make sure to have all other labs completed before this test because you will need to stay quarantined until your procedure.   Follow-Up: At [facility], you and your health needs are our priority.  As part of our continuing mission to provide you with exceptional heart care, we have created designated Provider Care Teams.  These Care Teams include your primary Cardiologist (physician) and Advanced Practice Providers (APPs -  Physician Assistants and Nurse Practitioners) who all work together to provide you with the care you need, when you need it.  We recommend signing up for the patient portal called "MyChart".  Sign up information is provided on this After Visit Summary.  MyChart is used to connect with patients for Virtual Visits (Telemedicine).  Patients are able to view lab/test results, encounter notes, upcoming appointments, etc.  Non-urgent messages can be sent to your provider as well.   To learn more about what you can do  with MyChart, go to [email or web address].    Your next appointment:   1 month(s)  The format for your next appointment:   In Person  Provider:   Buford Dresser, MD   You are scheduled for a TEE Cardioversion on Friday 08/03/20 with Dr. Sallyanne Kuster.  Please arrive at the [facility] (Main Entrance A) at [facility]: [address] [place], [place] [address] at 12 pm.   DIET: Nothing to eat or drink after midnight except a sip of water with medications (see medication instructions below)  Hold: Insulin the night before and morning of procedure  Continue your anticoagulant: Pradaxa  You will need to continue your anticoagulant after your procedure until you  are told by your  Provider that it is safe to stop   Labs:  Today (BMP, CBC)  You must have a responsible person to drive you home and stay in the waiting area during your procedure. Failure to do so could result in cancellation.  Bring your insurance cards.  *Special Note: Every effort is made to have your procedure done on time. Occasionally there are emergencies that occur at the hospital that may cause delays. Please be patient if a delay does occur.

## 2020-07-31 NOTE — Progress Notes (Signed)
Cardiology Office Note:    Date:  07/31/2020   ID:  [name], DOB 1955-07-24, MRN [id number]  PCP:  [name], MD  Cardiologist:  Buford Dresser, MD PhD  Referring MD: [name], *   CC: follow up  History of Present Illness:    [name] is a 65 y.o. male with a hx of persistent atrial fibrillation, atrial flutter with prior ablation in 2012, prior cardioversions, HTN, chronic systolic heart failure (thought to be tachycardia induced) with EF 30-35% on tte 04/2018, OSA who is seen in follow up today. I first saw him as a new consult 12/28/18.  Cardiac history: 12/14/18 Was planned for afib ablation. However, TEE performed was concerning for thrombus. Also noted to have EF 30%, severe biatrial enlargement. 04/2018: TTE, EF 30-35%, diffuse hypokinesis, biatrial enlargement  Today: Feels like he has been back in afib for several days. Feels short of breath without much exertion, was severely symptomatic starting yesterday AM. Last seen by Dr. Rayann Heman 12/14/19. Was doing well in sinus rhythm at the time, offered to revisit ablation but planned to continue with medical therapy.  Has been having dry heaves for the last 6-7 days, started several days before the afib. No fevers/chills. Has been able to keep medications down. No recent change to ozempic dose. No missed doses of anticoagulation in the last few weeks.   He is visibly uncomfortable and short of breath today. He is in afib in the 120s. We discussed multiple options for management, see below.  Denies chest pain, PND, orthopnea, LE edema or unexpected weight gain. No syncope.  Past Medical History:  Diagnosis Date  . Asthma    as a teenager - does not use an inhaler although pt said he has an albuteral inhaler  . Atrial fibrillation ([employer])    persistant 02/2009  . Atrial flutter ([place])    s/p CTI ablation 08/06/10  . Cataract   . Chronic rhinitis   . Colon polyp   . Congestive heart  failure ([place])   . Diverticulosis    colonoscopy 04/03/2009  . Hyperlipidemia   . Hypertension   . Morbid obesity ([place])    target weight = 219  for BMI < 30  . Nonischemic cardiomyopathy (H[facility])    tachycardia mediated  . Seasonal allergies   . Sleep apnea    original 2001 - wears C-PaP  . Type 2 diabetes mellitus ([place])     Past Surgical History:  Procedure Laterality Date  . ATRIAL FIBRILLATION ABLATION N/A 12/14/2018   Procedure: ATRIAL FIBRILLATION ABLATION;  Surgeon: Thompson Grayer, MD;  Location: [facility];  Service: Cardiovascular;  Laterality: N/A;  . atrial flutter ablation  08/06/10  . CARDIOVERSION N/A 07/05/2018   Procedure: CARDIOVERSION;  Surgeon: Buford Dresser, MD;  Location: [facility];  Service: Cardiovascular;  Laterality: N/A;  . COLONOSCOPY    . POLYPECTOMY    . TEE WITHOUT CARDIOVERSION N/A 12/14/2018   Procedure: TRANSESOPHAGEAL ECHOCARDIOGRAM (TEE);  Surgeon: Thompson Grayer, MD;  Location: [facility];  Service: Cardiovascular;  Laterality: N/A;    Current Medications: Current Outpatient Medications on File Prior to Visit  Medication Sig  . albuterol (PROVENTIL HFA;VENTOLIN HFA) 108 (90 Base) MCG/ACT inhaler Inhale 2 puffs into the lungs every 6 (six) hours as needed for wheezing or shortness of breath.  . B-D UF III MINI PEN NEEDLES 31G X 5 MM MISC 1 EACH BY OTHER ROUTE 4 (FOUR) TIMES DAILY. E11.9  .  Continuous Blood Gluc Receiver (FREESTYLE LIBRE 14 DAY READER) DEVI USE AS DIRECTED  . Continuous Blood Gluc Sensor (FREESTYLE LIBRE 14 DAY SENSOR) MISC Use as directed by Diabetes educator  . dabigatran (PRADAXA) 150 MG CAPS capsule Take 1 capsule (150 mg total) by mouth 2 (two) times daily.  [name] doxylamine, Sleep, (UNISOM) 25 MG tablet Take 50 mg by mouth at bedtime as needed for sleep.   . furosemide (LASIX) 40 MG tablet TAKE 1 TABLET TWICE DAILY  . insulin aspart (NOVOLOG) 100 UNIT/ML FlexPen Inject 8-12 Units into the skin 3 (three) times  daily with meals.  . insulin degludec (TRESIBA FLEXTOUCH) 200 UNIT/ML FlexTouch Pen Inject 40 Units into the skin daily.  [name] levothyroxine (SYNTHROID) 50 MCG tablet TAKE 1 TABLET BY MOUTH DAILY BEFORE BREAKFAST  . losartan (COZAAR) 25 MG tablet Take 1 tablet (25 mg total) by mouth every morning.  . mometasone (NASONEX) 50 MCG/ACT nasal spray Place 2 sprays into the nose daily as needed (allergies or rhinitis).  [name] OZEMPIC, 0.25 OR 0.5 MG/DOSE, 2 MG/1.5ML SOPN INJECT 0.5 MG INTO THE SKIN ONCE A WEEK.  . rosuvastatin (CRESTOR) 10 MG tablet TAKE 1 TABLET BY MOUTH EVERY DAY  . spironolactone (ALDACTONE) 25 MG tablet TAKE 1/2 TABLET BY MOUTH EVERY DAY  . SYMBICORT 80-4.5 MCG/ACT inhaler USE 2 INHALATIONS TWICE A DAY (Patient taking differently: Inhale 2 puffs into the lungs 2 (two) times daily as needed (for flares).)  . thiamine 100 MG tablet Take 1 tablet (100 mg total) by mouth daily.  . vitamin C (ASCORBIC ACID) 500 MG tablet Take 500-1,000 mg by mouth daily.  [name] gabapentin (NEURONTIN) 300 MG capsule TAKE 1 CAPSULE BY MOUTH THREE TIMES A DAY (Patient not taking: Reported on 07/31/2020)   No current facility-administered medications on file prior to visit.     Allergies:   Patient has no known allergies.   Social History   Tobacco Use  . Smoking status: Never Smoker  . Smokeless tobacco: Never Used  Vaping Use  . Vaping Use: Never used  Substance Use Topics  . Alcohol use: Yes    Alcohol/week: 21.0 standard drinks    Types: 21 Cans of beer per week    Comment: previously quite heavy, working on cessation   . Drug use: No     Family History: The patient's family history includes COPD in his father; Colon cancer in his brother; Hypertension in his mother; Kidney failure in his mother. There is no history of Esophageal cancer, Rectal cancer, or Stomach cancer.  ROS:   Please see the history of present illness.  Additional pertinent ROS otherwise unremarkable.  EKGs/Labs/Other Studies  Reviewed:    The following studies were reviewed today: TTE 2019-10-03 1. Left ventricular ejection fraction, by estimation, is 40 to 45%. The  left ventricle has mildly decreased function. The left ventricle  demonstrates global hypokinesis. There is severe left ventricular  hypertrophy, asymmetric and septal predominant  (parasternal views of the septum raise question of HOCM or infiltrative  cardiomyopathy - cardiac MRI could be considered if clinically  appropriate). Left ventricular diastolic parameters are consistent with  Grade I diastolic dysfunction (impaired  relaxation).  2. Right ventricular systolic function is normal. The right ventricular  size is normal. Tricuspid regurgitation signal is inadequate for assessing  PA pressure.  3. Left atrial size was mildly dilated.  4. The mitral valve is grossly normal. Trivial mitral valve  regurgitation.  5. The aortic valve was not well visualized.  Aortic valve regurgitation  is not visualized.  6. Aortic dilatation noted. There is mild dilatation of the ascending  aorta.  7. The inferior vena cava is normal in size with greater than 50%  respiratory variability, suggesting right atrial pressure of 3 mmHg.   Lexiscan 04/07/19  Nuclear stress EF: 49%.  The left ventricular ejection fraction is mildly decreased (45-54%).  No T wave inversion was noted during stress.  There was no ST segment deviation noted during stress.  Defect 1: There is a small defect of mild severity.  This is an intermediate risk study.   Small size, mild intensity fixed apical perfusion defect, likely thinning. No significant reversible ischemia. Dilated LV with mild hypokinesis - LVEF 49%. This is an intermediate risk study due to decreased LV function.  TTE 05/03/18 Left ventricle: The cavity size was normal. There was severe   concentric hypertrophy. Systolic function was moderately to   severely reduced. The estimated ejection fraction was  in the   range of 30% to 35%. Diffuse hypokinesis. The study was not   technically sufficient to allow evaluation of LV diastolic   dysfunction due to atrial fibrillation. - Aortic valve: There was no regurgitation. - Aortic root: The aortic root was normal in size. - Mitral valve: There was trivial regurgitation. - Left atrium: The atrium was severely dilated. - Right ventricle: Systolic function was normal. - Right atrium: The atrium was moderately dilated. - Tricuspid valve: There was trivial regurgitation. - Pulmonic valve: Transvalvular velocity was within the normal   range. There was no evidence for stenosis. - Pulmonary arteries: Systolic pressure was within the normal   range. - Inferior vena cava: The vessel was normal in size. The   respirophasic diameter changes were in the normal range (= 50%),   consistent with normal central venous pressure. - Pericardium, extracardiac: There was no pericardial effusion.  Impressions: - When compared to the prior study from 04/02/2017 LVEF has   decreased from 55-60% to 30-35% with diffuse hypokinesis.  TEE 12/14/18  1. The left ventricle has moderate-severely reduced systolic function, with an ejection fraction of 30-35%. The cavity size was moderately dilated. Left ventrical global hypokinesis without regional wall motion abnormalities.  2. The right ventricle has normal systolc function. The cavity was normal. There is no increase in right ventricular wall thickness.  3. Left atrial size was severely dilated.  4. Dense smoke in atria and appendage. Stagnant pre thrombus and likely thrombus in mid to distal LAA High risk for embolic event EP procedure cancelled.  5. Right atrial size was moderately dilated.  6. The mitral valve is grossly normal. Mild thickening of the mitral valve leaflet. Mild calcification of the mitral valve leaflet. Mitral valve regurgitation is mild to moderate by color flow Doppler.  7. Restricted posterior  leaflet motion.  8. The aortic valve is tricuspid Moderate thickening of the aortic valve Sclerosis without any evidence of stenosis of the aortic valve. Aortic valve regurgitation is trivial by color flow Doppler.  9. The aortic root is normal in size and structure.  EKG:  EKG is personally reviewed.  The ekg ordered today demonstrates atrial fibrillation with RVR at 123 bpm, IVCD Recent Labs: 09/26/2019: Magnesium 1.9 05/16/2020: ALT 23; BUN 17; Creatinine, Ser 1.56; Hemoglobin 17.6; Platelets 210.0; Potassium 3.7; Sodium 139 06/29/2020: TSH 3.52  Recent Lipid Panel    Component Value Date/Time   CHOL 143 05/16/2020 1035   TRIG 107.0 05/16/2020 1035   HDL 53.20 05/16/2020  1035   CHOLHDL 3 05/16/2020 1035   VLDL 21.4 05/16/2020 1035   LDLCALC 69 05/16/2020 1035   LDLDIRECT 81.0 08/31/2015 0905    Physical Exam:    VS:  BP (!) 130/100   Pulse (!) 123   Ht 6\' 1"  (1.854 m)   Wt 293 lb 9.6 oz (133.2 kg)   SpO2 98%   BMI 38.74 kg/m     Wt Readings from Last 3 Encounters:  07/31/20 293 lb 9.6 oz (133.2 kg)  07/12/20 290 lb (131.5 kg)  07/06/20 288 lb 3.2 oz (130.7 kg)   GEN: Well nourished, well developed. Conversationally dyspneic HEENT: Normal, moist mucous membranes NECK: No JVD CARDIAC: tachycardic, irregularly irregular rhythm, normal S1 and S2, no rubs or gallops. No murmur. VASCULAR: Radial and DP pulses 2+ bilaterally. No carotid bruits RESPIRATORY:  Clear to auscultation without rales, wheezing or rhonchi  ABDOMEN: Soft, non-tender, non-distended MUSCULOSKELETAL:  Ambulates independently SKIN: Warm and dry, no edema NEUROLOGIC:  Alert and oriented x 3. No focal neuro deficits noted. PSYCHIATRIC:  Normal affect   ASSESSMENT:    1. Paroxysmal atrial fibrillation with RVR ([place])   2. Pre-procedure lab exam   3. Paroxysmal atrial fibrillation (HCC)   4. NICM (nonischemic cardiomyopathy) ([place])   5. Acquired thrombophilia ([place])   6. Chronic systolic heart failure  ([place])   7. Essential hypertension   8. Type 2 diabetes mellitus with hyperlipidemia (HCC)    PLAN:    Paroxysmal atrial fibrillation, with RVR today History of atrial fibrillation and atrial flutter, s/p prior cardioversions and CTI -CHADSVASC=3 -Has OSA, encouraged adherence to CPAP -Bradycardia with amiodarone, carvedilol, though he had other medical issues at the time of significant bradycardia -Continue dabigatran, denies missed doses in the last few weeks. -he had TEE pre EP procedure 12/14/2018 with stagnant flow and likely thrombus in the LAA. Ablation cancelled. Was on rivaroxaban prior to the TEE, changed to dabigatran after. Given the history, I would recommend TEE prior to cardioversion to fully exclude LV thrombus. If no thrombus, can then proceed with cardioverison -he has a history of bradycardia but is very rapid today. I will given him temporary short acting metoprolol PRN for rate control. Ideally he can stop this once he is back in sinus rhythm -if he cannot hold sinus with a cardioversion, will need to discuss next steps with Dr. Rayann Heman. Advantage would be that he has already had TEE to discuss ablation if needed -pre procedure labs, orders done  Nonischemic cardiomyopathy, with chronic systolic and diastolic heart failure:  -euvolemic, NYHA class I -had planned for [facility], then had asymptomatic covid positive test, then renal failure. Lexiscan done, no reversible ischemia. -he has tolerated losartan and spironolactone -Continue 40 mg furosemide BID, no edema today -repeat echo 09/04/19 with EF 40-45% -discussed cMRI, declines -discussed entresto, declines. Would like to stay on current medications -on GLP1RA given diabetes. Followed by Dr. Renne Crigler in endocrinology.  Medication history: -amiodarone and carvedilol stopped during admission 08/2019 due to symptomatic bradycardia, in the setting of hyperosmolar hyperglycemia. -off hydralazine 50 mg BID, imdur 30 mg daily due to  hypotension  Hypertension: -continue medications as above  Type II diabetes with hyperlipidemia: -on semaglutide (GLP1RA) and insulin -continue rosuvastatin -no aspirin as he is on dabigatran  Cardiac risk counseling and prevention recommendations: -recommend heart healthy/Mediterranean diet, with whole grains, fruits, vegetable, fish, lean meats, nuts, and olive oil. Limit salt. -recommend moderate walking, 3-5 times/week for 30-50 minutes each session. Aim for at  least 150 minutes.week. Goal should be pace of 3 miles/hours, or walking 1.5 miles in 30 minutes -recommend avoidance of tobacco products. Avoid excess alcohol. -ASCVD risk score: The 10-year ASCVD risk score Mikey Bussing DC Jr., et al., 2013) is: 21.3%   Values used to calculate the score:     Age: 66 years     Sex: Male     Is Non-Hispanic African American: No     Diabetic: Yes     Tobacco smoker: No     Systolic Blood Pressure: 696 mmHg     Is BP treated: Yes     HDL Cholesterol: 53.2 mg/dL     Total Cholesterol: 143 mg/dL    Plan for follow up: 1 month if he holds sinus rhythm, otherwise will need to have him seen sooner  Total time of encounter: 45 minutes total time of encounter, including 31 minutes spent in face-to-face patient care. This time includes coordination of care and counseling regarding options for management of atrial fibrillation. Remainder of non-face-to-face time involved reviewing chart documents/testing relevant to the patient encounter and documentation in the medical record.  Buford Dresser, MD, PhD, [facility]   Medication Adjustments/Labs and Tests Ordered: Current medicines are reviewed at length with the patient today.  Concerns regarding medicines are outlined above.  Orders Placed This Encounter  Procedures  . Basic metabolic panel  . CBC  . EKG 12-Lead   Meds ordered this encounter  Medications  . metoprolol tartrate (LOPRESSOR) 25 MG tablet    Sig: Take  1 tablet (25 mg total) by mouth 2 (two) times daily as needed (heart rate greater than 90).    Dispense:  30 tablet    Refill:  3    Patient Instructions  Medication Instructions:  Take Metoprolol 25 mg twice a day as needed for heart greater than 90  *If you need a refill on your cardiac medications before your next appointment, please call your pharmacy*   Lab Work: Your physician recommends lab work today (CBC, BMP).  If you have labs (blood work) drawn today and your tests are completely normal, you will receive your results only by: [name] MyChart Message (if you have MyChart) OR . A paper copy in the mail If you have any lab test that is abnormal or we need to change your treatment, we will call you to review the results.   Testing/Procedures: [place] will need to have the coronavirus test completed prior to your procedure. An appointment has been made at 12:05 pm on Wednesday 08/01/20. This is a Drive Up Visit at [address], [place], [place] [address]. Please tell them that you are there for procedure testing. Stay in your car and someone will be with you shortly. Please make sure to have all other labs completed before this test because you will need to stay quarantined until your procedure.   Follow-Up: At [facility], you and your health needs are our priority.  As part of our continuing mission to provide you with exceptional heart care, we have created designated Provider Care Teams.  These Care Teams include your primary Cardiologist (physician) and Advanced Practice Providers (APPs -  Physician Assistants and Nurse Practitioners) who all work together to provide you with the care you need, when you need it.  We recommend signing up for the patient portal called "MyChart".  Sign up information is provided on this After Visit Summary.  MyChart is used  to connect with patients for Virtual Visits (Telemedicine).  Patients are able to view  lab/test results, encounter notes, upcoming appointments, etc.  Non-urgent messages can be sent to your provider as well.   To learn more about what you can do with MyChart, go to [email or web address].    Your next appointment:   1 month(s)  The format for your next appointment:   In Person  Provider:   Buford Dresser, MD   You are scheduled for a TEE Cardioversion on Friday 08/03/20 with Dr. Sallyanne Kuster.  Please arrive at the [facility] (Main Entrance A) at [facility]: [address] [place], [place] [address] at 12 pm.   DIET: Nothing to eat or drink after midnight except a sip of water with medications (see medication instructions below)  Hold: Insulin the night before and morning of procedure  Continue your anticoagulant: Pradaxa  You will need to continue your anticoagulant after your procedure until you  are told by your  Provider that it is safe to stop   Labs:  Today (BMP, CBC)  You must have a responsible person to drive you home and stay in the waiting area during your procedure. Failure to do so could result in cancellation.  Bring your insurance cards.  *Special Note: Every effort is made to have your procedure done on time. Occasionally there are emergencies that occur at the hospital that may cause delays. Please be patient if a delay does occur.      Signed, Buford Dresser, MD PhD 07/31/2020  [place]

## 2020-08-01 ENCOUNTER — Other Ambulatory Visit (HOSPITAL_COMMUNITY)
Admission: RE | Admit: 2020-08-01 | Discharge: 2020-08-01 | Disposition: A | Discharge status: Home / Self Care | Payer: Medicare HMO | Source: Ambulatory Visit | Attending: Cardiovascular Disease | Admitting: Cardiovascular Disease

## 2020-08-01 DIAGNOSIS — Z79890 Hormone replacement therapy (postmenopausal): Secondary | ICD-10-CM | POA: Diagnosis not present

## 2020-08-01 DIAGNOSIS — D6859 Other primary thrombophilia: Secondary | ICD-10-CM | POA: Diagnosis present

## 2020-08-01 DIAGNOSIS — I361 Nonrheumatic tricuspid (valve) insufficiency: Secondary | ICD-10-CM | POA: Diagnosis not present

## 2020-08-01 DIAGNOSIS — E785 Hyperlipidemia, unspecified: Secondary | ICD-10-CM | POA: Diagnosis present

## 2020-08-01 DIAGNOSIS — I5043 Acute on chronic combined systolic (congestive) and diastolic (congestive) heart failure: Secondary | ICD-10-CM | POA: Diagnosis present

## 2020-08-01 DIAGNOSIS — I428 Other cardiomyopathies: Secondary | ICD-10-CM | POA: Diagnosis present

## 2020-08-01 DIAGNOSIS — N183 Chronic kidney disease, stage 3 unspecified: Secondary | ICD-10-CM | POA: Diagnosis not present

## 2020-08-01 DIAGNOSIS — E875 Hyperkalemia: Secondary | ICD-10-CM | POA: Diagnosis present

## 2020-08-01 DIAGNOSIS — I4819 Other persistent atrial fibrillation: Secondary | ICD-10-CM | POA: Diagnosis present

## 2020-08-01 DIAGNOSIS — I5082 Biventricular heart failure: Secondary | ICD-10-CM | POA: Diagnosis present

## 2020-08-01 DIAGNOSIS — Z8249 Family history of ischemic heart disease and other diseases of the circulatory system: Secondary | ICD-10-CM | POA: Diagnosis not present

## 2020-08-01 DIAGNOSIS — G4733 Obstructive sleep apnea (adult) (pediatric): Secondary | ICD-10-CM | POA: Diagnosis present

## 2020-08-01 DIAGNOSIS — I110 Hypertensive heart disease with heart failure: Secondary | ICD-10-CM | POA: Diagnosis not present

## 2020-08-01 DIAGNOSIS — Z20822 Contact with and (suspected) exposure to covid-19: Secondary | ICD-10-CM | POA: Insufficient documentation

## 2020-08-01 DIAGNOSIS — Z800 Family history of malignant neoplasm of digestive organs: Secondary | ICD-10-CM | POA: Diagnosis not present

## 2020-08-01 DIAGNOSIS — N1832 Chronic kidney disease, stage 3b: Secondary | ICD-10-CM | POA: Diagnosis present

## 2020-08-01 DIAGNOSIS — Z01812 Encounter for preprocedural laboratory examination: Secondary | ICD-10-CM | POA: Insufficient documentation

## 2020-08-01 DIAGNOSIS — I340 Nonrheumatic mitral (valve) insufficiency: Secondary | ICD-10-CM | POA: Diagnosis not present

## 2020-08-01 DIAGNOSIS — I5023 Acute on chronic systolic (congestive) heart failure: Secondary | ICD-10-CM | POA: Diagnosis not present

## 2020-08-01 DIAGNOSIS — Z7901 Long term (current) use of anticoagulants: Secondary | ICD-10-CM | POA: Diagnosis not present

## 2020-08-01 DIAGNOSIS — I10 Essential (primary) hypertension: Secondary | ICD-10-CM | POA: Diagnosis not present

## 2020-08-01 DIAGNOSIS — N179 Acute kidney failure, unspecified: Secondary | ICD-10-CM | POA: Diagnosis present

## 2020-08-01 DIAGNOSIS — E1169 Type 2 diabetes mellitus with other specified complication: Secondary | ICD-10-CM | POA: Diagnosis present

## 2020-08-01 DIAGNOSIS — E039 Hypothyroidism, unspecified: Secondary | ICD-10-CM | POA: Diagnosis not present

## 2020-08-01 DIAGNOSIS — K7689 Other specified diseases of liver: Secondary | ICD-10-CM | POA: Diagnosis present

## 2020-08-01 DIAGNOSIS — I480 Paroxysmal atrial fibrillation: Secondary | ICD-10-CM | POA: Diagnosis not present

## 2020-08-01 DIAGNOSIS — Z8616 Personal history of COVID-19: Secondary | ICD-10-CM | POA: Diagnosis not present

## 2020-08-01 DIAGNOSIS — J45909 Unspecified asthma, uncomplicated: Secondary | ICD-10-CM | POA: Diagnosis not present

## 2020-08-01 DIAGNOSIS — E1122 Type 2 diabetes mellitus with diabetic chronic kidney disease: Secondary | ICD-10-CM | POA: Diagnosis present

## 2020-08-01 DIAGNOSIS — I130 Hypertensive heart and chronic kidney disease with heart failure and stage 1 through stage 4 chronic kidney disease, or unspecified chronic kidney disease: Secondary | ICD-10-CM | POA: Diagnosis present

## 2020-08-01 DIAGNOSIS — I4891 Unspecified atrial fibrillation: Secondary | ICD-10-CM | POA: Diagnosis not present

## 2020-08-01 DIAGNOSIS — I517 Cardiomegaly: Secondary | ICD-10-CM | POA: Diagnosis not present

## 2020-08-01 DIAGNOSIS — I4892 Unspecified atrial flutter: Secondary | ICD-10-CM | POA: Diagnosis present

## 2020-08-01 DIAGNOSIS — Z794 Long term (current) use of insulin: Secondary | ICD-10-CM | POA: Diagnosis not present

## 2020-08-01 DIAGNOSIS — I081 Rheumatic disorders of both mitral and tricuspid valves: Secondary | ICD-10-CM | POA: Diagnosis not present

## 2020-08-01 DIAGNOSIS — Z825 Family history of asthma and other chronic lower respiratory diseases: Secondary | ICD-10-CM | POA: Diagnosis not present

## 2020-08-01 DIAGNOSIS — I5021 Acute systolic (congestive) heart failure: Secondary | ICD-10-CM | POA: Diagnosis not present

## 2020-08-01 DIAGNOSIS — Z79899 Other long term (current) drug therapy: Secondary | ICD-10-CM | POA: Diagnosis not present

## 2020-08-01 DIAGNOSIS — I509 Heart failure, unspecified: Secondary | ICD-10-CM | POA: Diagnosis not present

## 2020-08-01 LAB — CBC
Hematocrit: 57.4 % — ABNORMAL HIGH (ref 37.5–51.0)
Hemoglobin: 18.6 g/dL — ABNORMAL HIGH (ref 13.0–17.7)
MCH: 29.5 pg (ref 26.6–33.0)
MCHC: 32.4 g/dL (ref 31.5–35.7)
MCV: 91 fL (ref 79–97)
Platelets: 244 10*3/uL (ref 150–450)
RBC: 6.3 x10E6/uL — ABNORMAL HIGH (ref 4.14–5.80)
RDW: 13.4 % (ref 11.6–15.4)
WBC: 9 10*3/uL (ref 3.4–10.8)

## 2020-08-01 LAB — BASIC METABOLIC PANEL
BUN/Creatinine Ratio: 15 (ref 10–24)
BUN: 24 mg/dL (ref 8–27)
CO2: 22 mmol/L (ref 20–29)
Calcium: 9.7 mg/dL (ref 8.6–10.2)
Chloride: 97 mmol/L (ref 96–106)
Creatinine, Ser: 1.6 mg/dL — ABNORMAL HIGH (ref 0.76–1.27)
GFR calc Af Amer: 51 mL/min/{1.73_m2} — ABNORMAL LOW (ref >59)
GFR calc non Af Amer: 45 mL/min/{1.73_m2} — ABNORMAL LOW (ref >59)
Glucose: 60 mg/dL — ABNORMAL LOW (ref 65–99)
Potassium: 5.2 mmol/L (ref 3.5–5.2)
Sodium: 137 mmol/L (ref 134–144)

## 2020-08-01 LAB — SARS CORONAVIRUS 2 (TAT 6-24 HRS): SARS Coronavirus 2: NEGATIVE

## 2020-08-03 ENCOUNTER — Inpatient Hospital Stay: Payer: Self-pay

## 2020-08-03 ENCOUNTER — Ambulatory Visit (HOSPITAL_COMMUNITY): Payer: Medicare HMO

## 2020-08-03 ENCOUNTER — Ambulatory Visit (HOSPITAL_COMMUNITY): Payer: Medicare HMO | Admitting: Certified Registered"

## 2020-08-03 ENCOUNTER — Inpatient Hospital Stay (HOSPITAL_COMMUNITY): Payer: Medicare HMO

## 2020-08-03 ENCOUNTER — Other Ambulatory Visit: Payer: Self-pay

## 2020-08-03 ENCOUNTER — Encounter (HOSPITAL_COMMUNITY)
Admission: RE | Disposition: A | Discharge status: Home / Self Care | Payer: Self-pay | Source: Home / Self Care | Attending: Cardiology

## 2020-08-03 ENCOUNTER — Encounter (HOSPITAL_COMMUNITY): Payer: Self-pay | Admitting: Cardiovascular Disease

## 2020-08-03 ENCOUNTER — Inpatient Hospital Stay (HOSPITAL_COMMUNITY)
Admission: RE | Admit: 2020-08-03 | Discharge: 2020-08-10 | DRG: 286 | Disposition: A | Discharge status: Home / Self Care | Payer: Medicare HMO | Attending: Cardiology | Admitting: Cardiology

## 2020-08-03 DIAGNOSIS — I5021 Acute systolic (congestive) heart failure: Secondary | ICD-10-CM | POA: Diagnosis not present

## 2020-08-03 DIAGNOSIS — I5082 Biventricular heart failure: Secondary | ICD-10-CM | POA: Diagnosis present

## 2020-08-03 DIAGNOSIS — I5043 Acute on chronic combined systolic (congestive) and diastolic (congestive) heart failure: Secondary | ICD-10-CM | POA: Diagnosis present

## 2020-08-03 DIAGNOSIS — Z800 Family history of malignant neoplasm of digestive organs: Secondary | ICD-10-CM | POA: Diagnosis not present

## 2020-08-03 DIAGNOSIS — N1832 Chronic kidney disease, stage 3b: Secondary | ICD-10-CM | POA: Diagnosis present

## 2020-08-03 DIAGNOSIS — I513 Intracardiac thrombosis, not elsewhere classified: Secondary | ICD-10-CM | POA: Diagnosis present

## 2020-08-03 DIAGNOSIS — I081 Rheumatic disorders of both mitral and tricuspid valves: Secondary | ICD-10-CM | POA: Diagnosis not present

## 2020-08-03 DIAGNOSIS — Z8249 Family history of ischemic heart disease and other diseases of the circulatory system: Secondary | ICD-10-CM | POA: Diagnosis not present

## 2020-08-03 DIAGNOSIS — Z20822 Contact with and (suspected) exposure to covid-19: Secondary | ICD-10-CM | POA: Diagnosis present

## 2020-08-03 DIAGNOSIS — Z825 Family history of asthma and other chronic lower respiratory diseases: Secondary | ICD-10-CM

## 2020-08-03 DIAGNOSIS — Z8616 Personal history of COVID-19: Secondary | ICD-10-CM | POA: Diagnosis not present

## 2020-08-03 DIAGNOSIS — I130 Hypertensive heart and chronic kidney disease with heart failure and stage 1 through stage 4 chronic kidney disease, or unspecified chronic kidney disease: Principal | ICD-10-CM | POA: Diagnosis present

## 2020-08-03 DIAGNOSIS — N179 Acute kidney failure, unspecified: Secondary | ICD-10-CM | POA: Diagnosis present

## 2020-08-03 DIAGNOSIS — I5022 Chronic systolic (congestive) heart failure: Secondary | ICD-10-CM

## 2020-08-03 DIAGNOSIS — I480 Paroxysmal atrial fibrillation: Secondary | ICD-10-CM | POA: Diagnosis not present

## 2020-08-03 DIAGNOSIS — I361 Nonrheumatic tricuspid (valve) insufficiency: Secondary | ICD-10-CM

## 2020-08-03 DIAGNOSIS — Z79890 Hormone replacement therapy (postmenopausal): Secondary | ICD-10-CM | POA: Diagnosis not present

## 2020-08-03 DIAGNOSIS — J45909 Unspecified asthma, uncomplicated: Secondary | ICD-10-CM | POA: Diagnosis not present

## 2020-08-03 DIAGNOSIS — I340 Nonrheumatic mitral (valve) insufficiency: Secondary | ICD-10-CM

## 2020-08-03 DIAGNOSIS — I4819 Other persistent atrial fibrillation: Secondary | ICD-10-CM | POA: Diagnosis present

## 2020-08-03 DIAGNOSIS — Z794 Long term (current) use of insulin: Secondary | ICD-10-CM | POA: Diagnosis not present

## 2020-08-03 DIAGNOSIS — E039 Hypothyroidism, unspecified: Secondary | ICD-10-CM | POA: Diagnosis not present

## 2020-08-03 DIAGNOSIS — D6859 Other primary thrombophilia: Secondary | ICD-10-CM | POA: Diagnosis present

## 2020-08-03 DIAGNOSIS — E785 Hyperlipidemia, unspecified: Secondary | ICD-10-CM | POA: Diagnosis present

## 2020-08-03 DIAGNOSIS — E1122 Type 2 diabetes mellitus with diabetic chronic kidney disease: Secondary | ICD-10-CM | POA: Diagnosis present

## 2020-08-03 DIAGNOSIS — E875 Hyperkalemia: Secondary | ICD-10-CM | POA: Diagnosis present

## 2020-08-03 DIAGNOSIS — I4892 Unspecified atrial flutter: Secondary | ICD-10-CM | POA: Diagnosis present

## 2020-08-03 DIAGNOSIS — G4733 Obstructive sleep apnea (adult) (pediatric): Secondary | ICD-10-CM | POA: Diagnosis present

## 2020-08-03 DIAGNOSIS — I10 Essential (primary) hypertension: Secondary | ICD-10-CM | POA: Diagnosis not present

## 2020-08-03 DIAGNOSIS — I110 Hypertensive heart disease with heart failure: Secondary | ICD-10-CM | POA: Diagnosis not present

## 2020-08-03 DIAGNOSIS — I4891 Unspecified atrial fibrillation: Secondary | ICD-10-CM | POA: Diagnosis not present

## 2020-08-03 DIAGNOSIS — Z79899 Other long term (current) drug therapy: Secondary | ICD-10-CM | POA: Diagnosis not present

## 2020-08-03 DIAGNOSIS — Z7901 Long term (current) use of anticoagulants: Secondary | ICD-10-CM

## 2020-08-03 DIAGNOSIS — Z841 Family history of disorders of kidney and ureter: Secondary | ICD-10-CM

## 2020-08-03 DIAGNOSIS — I428 Other cardiomyopathies: Secondary | ICD-10-CM | POA: Diagnosis present

## 2020-08-03 DIAGNOSIS — K7689 Other specified diseases of liver: Secondary | ICD-10-CM | POA: Diagnosis present

## 2020-08-03 DIAGNOSIS — N183 Chronic kidney disease, stage 3 unspecified: Secondary | ICD-10-CM

## 2020-08-03 DIAGNOSIS — Z452 Encounter for adjustment and management of vascular access device: Secondary | ICD-10-CM

## 2020-08-03 DIAGNOSIS — E1169 Type 2 diabetes mellitus with other specified complication: Secondary | ICD-10-CM | POA: Diagnosis present

## 2020-08-03 DIAGNOSIS — I517 Cardiomegaly: Secondary | ICD-10-CM | POA: Diagnosis not present

## 2020-08-03 DIAGNOSIS — I5023 Acute on chronic systolic (congestive) heart failure: Secondary | ICD-10-CM | POA: Diagnosis not present

## 2020-08-03 DIAGNOSIS — I509 Heart failure, unspecified: Secondary | ICD-10-CM | POA: Diagnosis not present

## 2020-08-03 HISTORY — PX: TEE WITHOUT CARDIOVERSION: SHX5443

## 2020-08-03 LAB — GLUCOSE, CAPILLARY
Glucose-Capillary: 84 mg/dL (ref 70–99)
Glucose-Capillary: 63 mg/dL — ABNORMAL LOW (ref 70–99)
Glucose-Capillary: 76 mg/dL (ref 70–99)
Glucose-Capillary: 78 mg/dL (ref 70–99)
Glucose-Capillary: 158 mg/dL — ABNORMAL HIGH (ref 70–99)

## 2020-08-03 LAB — HIV ANTIBODY (ROUTINE TESTING W REFLEX): HIV Screen 4th Generation wRfx: NONREACTIVE

## 2020-08-03 LAB — COOXEMETRY PANEL
Carboxyhemoglobin: 0.9 % (ref 0.5–1.5)
Methemoglobin: 0.8 % (ref 0.0–1.5)
O2 Saturation: 35 %
Total hemoglobin: 17.2 g/dL — ABNORMAL HIGH (ref 12.0–16.0)

## 2020-08-03 LAB — BRAIN NATRIURETIC PEPTIDE: B Natriuretic Peptide: 1668.7 pg/mL — ABNORMAL HIGH (ref 0.0–100.0)

## 2020-08-03 SURGERY — ECHOCARDIOGRAM, TRANSESOPHAGEAL
Anesthesia: Monitor Anesthesia Care

## 2020-08-03 MED ORDER — WARFARIN - PHARMACIST DOSING INPATIENT: Freq: Every day | Status: DC

## 2020-08-03 MED ORDER — DABIGATRAN ETEXILATE MESYLATE 150 MG PO CAPS
150.0000 mg | ORAL_CAPSULE | Freq: Two times a day (BID) | ORAL | Status: DC
  Filled 2020-08-03: qty 1

## 2020-08-03 MED ORDER — FUROSEMIDE 10 MG/ML IJ SOLN
80.0000 mg | Freq: Two times a day (BID) | INTRAMUSCULAR | Status: DC
  Administered 2020-08-03 – 2020-08-05 (×4): 80 mg via INTRAVENOUS
  Filled 2020-08-03 (×4): qty 8

## 2020-08-03 MED ORDER — SODIUM CHLORIDE 0.9 % IV SOLN: 250.0000 mL | INTRAVENOUS | Status: DC | PRN

## 2020-08-03 MED ORDER — SODIUM CHLORIDE 0.9% FLUSH: 10.0000 mL | INTRAVENOUS | Status: DC | PRN

## 2020-08-03 MED ORDER — SODIUM CHLORIDE 0.9% FLUSH
3.0000 mL | Freq: Two times a day (BID) | INTRAVENOUS | Status: DC
  Administered 2020-08-04 – 2020-08-10 (×9): 3 mL via INTRAVENOUS

## 2020-08-03 MED ORDER — PROPOFOL 500 MG/50ML IV EMUL
INTRAVENOUS | Status: DC | PRN
  Administered 2020-08-03: 150 ug/kg/min via INTRAVENOUS

## 2020-08-03 MED ORDER — SODIUM CHLORIDE 0.9% FLUSH
3.0000 mL | INTRAVENOUS | Status: DC | PRN
  Administered 2020-08-09: 3 mL via INTRAVENOUS

## 2020-08-03 MED ORDER — MILRINONE LACTATE IN DEXTROSE 20-5 MG/100ML-% IV SOLN
0.1250 ug/kg/min | INTRAVENOUS | Status: DC
  Administered 2020-08-03 – 2020-08-08 (×10): 0.25 ug/kg/min via INTRAVENOUS
  Administered 2020-08-08: 0.125 ug/kg/min via INTRAVENOUS
  Filled 2020-08-03 (×13): qty 100

## 2020-08-03 MED ORDER — DIGOXIN 125 MCG PO TABS
0.1250 mg | ORAL_TABLET | Freq: Every day | ORAL | Status: DC
  Administered 2020-08-03 – 2020-08-10 (×8): 0.125 mg via ORAL
  Filled 2020-08-03 (×8): qty 1

## 2020-08-03 MED ORDER — HEPARIN (PORCINE) 25000 UT/250ML-% IV SOLN
2150.0000 [IU]/h | INTRAVENOUS | Status: AC
  Administered 2020-08-03 – 2020-08-04 (×2): 1400 [IU]/h via INTRAVENOUS
  Administered 2020-08-05: 2100 [IU]/h via INTRAVENOUS
  Administered 2020-08-05: 1400 [IU]/h via INTRAVENOUS
  Administered 2020-08-06: 2100 [IU]/h via INTRAVENOUS
  Administered 2020-08-07 – 2020-08-08 (×2): 2150 [IU]/h via INTRAVENOUS
  Filled 2020-08-03 (×8): qty 250

## 2020-08-03 MED ORDER — ACETAMINOPHEN 325 MG PO TABS: 650.0000 mg | ORAL_TABLET | ORAL | Status: DC | PRN

## 2020-08-03 MED ORDER — FUROSEMIDE 10 MG/ML IJ SOLN: 40.0000 mg | Freq: Two times a day (BID) | INTRAMUSCULAR | Status: DC

## 2020-08-03 MED ORDER — MILRINONE LACTATE IN DEXTROSE 20-5 MG/100ML-% IV SOLN: 0.2500 ug/kg/min | INTRAVENOUS | Status: DC

## 2020-08-03 MED ORDER — WARFARIN SODIUM 2.5 MG PO TABS: 2.5000 mg | ORAL_TABLET | Freq: Every day | ORAL | Status: DC

## 2020-08-03 MED ORDER — SODIUM CHLORIDE 0.9 % IV SOLN: INTRAVENOUS | Status: DC | PRN

## 2020-08-03 MED ORDER — FUROSEMIDE 10 MG/ML IJ SOLN: 80.0000 mg | Freq: Two times a day (BID) | INTRAMUSCULAR | Status: DC

## 2020-08-03 MED ORDER — LEVOTHYROXINE SODIUM 50 MCG PO TABS
50.0000 ug | ORAL_TABLET | Freq: Every day | ORAL | Status: DC
  Administered 2020-08-04 – 2020-08-10 (×7): 50 ug via ORAL
  Filled 2020-08-03 (×7): qty 1

## 2020-08-03 MED ORDER — ROSUVASTATIN CALCIUM 5 MG PO TABS
10.0000 mg | ORAL_TABLET | Freq: Every day | ORAL | Status: DC
  Administered 2020-08-03 – 2020-08-10 (×7): 10 mg via ORAL
  Filled 2020-08-03 (×8): qty 2

## 2020-08-03 MED ORDER — CHLORHEXIDINE GLUCONATE CLOTH 2 % EX PADS
6.0000 | MEDICATED_PAD | Freq: Every day | CUTANEOUS | Status: DC
  Administered 2020-08-04 – 2020-08-09 (×6): 6 via TOPICAL

## 2020-08-03 MED ORDER — SODIUM CHLORIDE 0.9% FLUSH
3.0000 mL | Freq: Two times a day (BID) | INTRAVENOUS | Status: DC
  Administered 2020-08-06 – 2020-08-10 (×5): 3 mL via INTRAVENOUS

## 2020-08-03 MED ORDER — SODIUM CHLORIDE 0.9% FLUSH
10.0000 mL | Freq: Two times a day (BID) | INTRAVENOUS | Status: DC
  Administered 2020-08-03 – 2020-08-09 (×8): 10 mL

## 2020-08-03 NOTE — Progress Notes (Signed)
ANTICOAGULATION CONSULT NOTE - Initial Consult  Pharmacy Consult for heparin + warfarin Indication: LV Thrombus  No Known Allergies  Patient Measurements: Height: 6\' 1"  (185.4 cm) Weight: 133.8 kg (295 lb) IBW/kg (Calculated) : 79.9 Heparin Dosing Weight: 110kg   Vital Signs: Temp: 97.6 F (36.4 C) (02/18 1209) Temp Source: Oral (02/18 1536) BP: 116/99 (02/18 1536) Pulse Rate: 85 (02/18 1536)  Labs: No results for input(s): HGB, HCT, PLT, APTT, LABPROT, INR, HEPARINUNFRC, HEPRLOWMOCWT, CREATININE, CKTOTAL, CKMB, TROPONINIHS in the last 72 hours.  Estimated Creatinine Clearance: 66.1 mL/min (A) (by C-G formula based on SCr of 1.6 mg/dL (H)).   Medical History: Past Medical History:  Diagnosis Date  . Asthma    as a teenager - does not use an inhaler although pt said he has an albuteral inhaler  . Atrial fibrillation ([place])    persistant 02/2009  . Atrial flutter ([place])    s/p CTI ablation 08/06/10  . Cataract   . Chronic rhinitis   . Colon polyp   . Congestive heart failure ([place])   . Diverticulosis    colonoscopy 04/03/2009  . Hyperlipidemia   . Hypertension   . Morbid obesity ([place])    target weight = 219  for BMI < 30  . Nonischemic cardiomyopathy (HCC)    tachycardia mediated  . Seasonal allergies   . Sleep apnea    original 2001 - wears C-PaP  . Type 2 diabetes mellitus (HCC)       Assessment: 64yom with Hx LV thrombus previously on rivaroxaban and most recently on dabigitran with ongoing LV thrombus per TEE.   Plan heparin bridge to warfarin with goal 2.5.   Will give low doses warfarin over the weekend as [facility] planned Monday.  EF<20%   Goal of Therapy:  Heparin level 0.3-0.7 units/ml  APTT 66-103sec Monitor platelets by anticoagulation protocol: Yes   Plan:  Stop Pradaxa  Heparin drip 1400 uts/hr begin at 8pm - patient took pradaxa PTA this am Warfarin 2.5mg  daily over the weekend Aim for slow increase in INR for cath on Monday Heparin level,  aptt and protime in am  Monitor s/s bleeding   Bonnita Nasuti Pharm.D. CPP, BCPS Clinical Pharmacist [phone] 08/03/2020 4:12 PM

## 2020-08-03 NOTE — H&P (Addendum)
Advanced Heart Failure Team History and Physical Note   PCP:  [name], MD  PCP-Cardiology: Buford Dresser, MD     Reason for Admission: Acute on Chronic Systolic Heart Failure   HPI:    65 y/o male w/ h/o NICM, presumed to be tachy mediated from atrial fibrillation and atrial flutter.   Paroxsymal Afib/flutter dates back to ~2012. Had Aflutter ablation in 2012. He has had several echos between 2012-2018 showing normal LVEF.   Found to have reduced EF on echo in 04/2018, down to 30-35%. RV normal. Was in afib at the time.   He has not been able to tolerate amiodarone nor  blockers due to bradycardia.   11/2018 was referred for Afib ablation but procedure was aborted after TEE showed LV thrombus, despite being on Xarelto. Anticoagulation changed to Pradaxa. He ultimately spontaneously converted back to NSR.   Repeat echo 10/20 showed EF 35-40%. RV ok.  Plans were made for [facility] 10/20 however had asymptomatic covid positive test, then renal failure. Ischemic eval changed to Lexiscan NST which showed no reversible ischemia.  Echo repeated 3/21, EF 40-45%. G1DD. RV normal.   Recently seen in cardiology clinic w/ NYHA Class IIIb symptoms and found to be back I Afib in the 120s. Placed on low dose metoprolol and referred for outpatient TEE/DCCV.   Presented for procedure today. TEE shows LV thrombus. DCCV canceled. EF lower on TEE ~5-10%. RV severely reduced. Pt being admitted for a/c NYHA Class III CHF and diuresis.     Review of Systems: [y] = yes, [ ]  = no   General: Weight gain [Y ]; Weight loss [ ] ; Anorexia [ ] ; Fatigue [Y ]; Fever [ ] ; Chills [ ] ; Weakness [ ]   Cardiac: Chest pain/pressure [ ] ; Resting SOB [ ] ; Exertional SOB [ Y]; Orthopnea [ ] ; Pedal Edema [ ] ; Palpitations [ ] ; Syncope [ ] ; Presyncope [ ] ; Paroxysmal nocturnal dyspnea[Y ]  Pulmonary: Cough [ ] ; Wheezing[ ] ; Hemoptysis[ ] ; Sputum [ ] ; Snoring [ ]   GI: Vomiting[ ] ; Dysphagia[ ] ; Melena[  ]; Hematochezia [ ] ; Heartburn[ ] ; Abdominal pain [ ] ; Constipation [ ] ; Diarrhea [ ] ; BRBPR [ ]   GU: Hematuria[ ] ; Dysuria [ ] ; Nocturia[ ]   Vascular: Pain in legs with walking [ ] ; Pain in feet with lying flat [ ] ; Non-healing sores [ ] ; Stroke [ ] ; TIA [ ] ; Slurred speech [ ] ;  Neuro: Headaches[ ] ; Vertigo[ ] ; Seizures[ ] ; Paresthesias[ ] ;Blurred vision [ ] ; Diplopia [ ] ; Vision changes [ ]   Ortho/Skin: Arthritis [ ] ; Joint pain [ ] ; Muscle pain [ ] ; Joint swelling [ ] ; Back Pain [ ] ; Rash [ ]   Psych: Depression[ ] ; Anxiety[ ]   Heme: Bleeding problems [ ] ; Clotting disorders [ ] ; Anemia [ ]   Endocrine: Diabetes [ ] ; Thyroid dysfunction[ ]    Home Medications Prior to Admission medications   Medication Sig Start Date End Date Taking? Authorizing Provider  dabigatran (PRADAXA) 150 MG CAPS capsule Take 1 capsule (150 mg total) by mouth 2 (two) times daily. 10/13/19  Yes Buford Dresser, MD  doxylamine, Sleep, (UNISOM) 25 MG tablet Take 50 mg by mouth at bedtime as needed for sleep.    Yes [provider]  furosemide (LASIX) 40 MG tablet TAKE 1 TABLET TWICE DAILY Patient taking differently: Take 40 mg by mouth daily. 06/18/20  Yes [name], MD  insulin aspart (NOVOLOG) 100 UNIT/ML FlexPen Inject 8-12 Units into the skin 3 (three) times daily  with meals. Patient taking differently: Inject 8 Units into the skin 3 (three) times daily with meals. 11/01/19  Yes Philemon Kingdom, MD  insulin degludec (TRESIBA FLEXTOUCH) 200 UNIT/ML FlexTouch Pen Inject 40 Units into the skin daily. 06/29/20  Yes Philemon Kingdom, MD  levothyroxine (SYNTHROID) 50 MCG tablet TAKE 1 TABLET BY MOUTH DAILY BEFORE BREAKFAST Patient taking differently: Take 50 mcg by mouth daily before breakfast. 06/18/20  Yes [name], MD  losartan (COZAAR) 25 MG tablet Take 1 tablet (25 mg total) by mouth every morning. 05/16/20  Yes [name], MD  metoprolol tartrate (LOPRESSOR) 25 MG tablet  Take 1 tablet (25 mg total) by mouth 2 (two) times daily as needed (heart rate greater than 90). 07/31/20 10/29/20 Yes Buford Dresser, MD  mometasone (NASONEX) 50 MCG/ACT nasal spray Place 2 sprays into the nose daily as needed (allergies or rhinitis).   Yes [provider]  Multiple Vitamins-Minerals (MULTIVITAMIN WITH MINERALS) tablet Take 2 tablets by mouth daily. Chewable   Yes [provider]  OZEMPIC, 0.25 OR 0.5 MG/DOSE, 2 MG/1.5ML SOPN INJECT 0.5 MG INTO THE SKIN ONCE A WEEK. 04/20/20  Yes Philemon Kingdom, MD  rosuvastatin (CRESTOR) 10 MG tablet TAKE 1 TABLET BY MOUTH EVERY DAY Patient taking differently: Take 10 mg by mouth daily. 06/01/20  Yes Buford Dresser, MD  spironolactone (ALDACTONE) 25 MG tablet TAKE 1/2 TABLET BY MOUTH EVERY DAY Patient taking differently: Take 25 mg by mouth daily. 11/17/19  Yes Allred, Jeneen Rinks, MD  thiamine 100 MG tablet Take 1 tablet (100 mg total) by mouth daily. 09/08/19  Yes Guilford Shi, MD  vitamin C (ASCORBIC ACID) 500 MG tablet Take 500 mg by mouth daily.   Yes [provider]  albuterol (PROVENTIL HFA;VENTOLIN HFA) 108 (90 Base) MCG/ACT inhaler Inhale 2 puffs into the lungs every 6 (six) hours as needed for wheezing or shortness of breath.    [provider]  B-D UF III MINI PEN NEEDLES 31G X 5 MM MISC 1 EACH BY OTHER ROUTE 4 (FOUR) TIMES DAILY. E11.9 06/25/20   Philemon Kingdom, MD  Continuous Blood Gluc Receiver (FREESTYLE LIBRE 14 DAY READER) DEVI USE AS DIRECTED 12/20/19   Philemon Kingdom, MD  Continuous Blood Gluc Sensor (FREESTYLE LIBRE 14 DAY SENSOR) MISC Use as directed by Diabetes educator 12/26/19   Philemon Kingdom, MD  gabapentin (NEURONTIN) 300 MG capsule TAKE 1 CAPSULE BY MOUTH THREE TIMES A DAY 05/02/19   [name], MD  SYMBICORT 80-4.5 MCG/ACT inhaler USE 2 INHALATIONS TWICE A DAY Patient taking differently: Inhale 2 puffs into the lungs 2 (two) times daily as needed (for  flares). 02/12/17   [name], MD    Past Medical History: Past Medical History:  Diagnosis Date  . Asthma    as a teenager - does not use an inhaler although pt said he has an albuteral inhaler  . Atrial fibrillation ([place])    persistant 02/2009  . Atrial flutter ([place])    s/p CTI ablation 08/06/10  . Cataract   . Chronic rhinitis   . Colon polyp   . Congestive heart failure ([place])   . Diverticulosis    colonoscopy 04/03/2009  . Hyperlipidemia   . Hypertension   . Morbid obesity ([place])    target weight = 219  for BMI < 30  . Nonischemic cardiomyopathy ([facility])    tachycardia mediated  . Seasonal allergies   . Sleep apnea    original 2001 - wears C-PaP  .  Type 2 diabetes mellitus ([place])     Past Surgical History: Past Surgical History:  Procedure Laterality Date  . ATRIAL FIBRILLATION ABLATION N/A 12/14/2018   Procedure: ATRIAL FIBRILLATION ABLATION;  Surgeon: Thompson Grayer, MD;  Location: [facility];  Service: Cardiovascular;  Laterality: N/A;  . atrial flutter ablation  08/06/10  . CARDIOVERSION N/A 07/05/2018   Procedure: CARDIOVERSION;  Surgeon: Buford Dresser, MD;  Location: [facility] ENDOSCOPY;  Service: Cardiovascular;  Laterality: N/A;  . COLONOSCOPY    . POLYPECTOMY    . TEE WITHOUT CARDIOVERSION N/A 12/14/2018   Procedure: TRANSESOPHAGEAL ECHOCARDIOGRAM (TEE);  Surgeon: Thompson Grayer, MD;  Location: [place] [facility];  Service: Cardiovascular;  Laterality: N/A;    Family History:  Family History  Problem Relation Age of Onset  . Hypertension Mother   . Kidney failure Mother        Due to sepsis  . COPD Father   . Colon cancer Brother        dx in 72's  . Esophageal cancer Neg Hx   . Rectal cancer Neg Hx   . Stomach cancer Neg Hx     Social History: Social History   Socioeconomic History  . Marital status: Single    Spouse name: Not on file  . Number of children: 0  . Years of education: Not on file  . Highest education level: Not on  file  Occupational History  . Occupation: Sales  Tobacco Use  . Smoking status: Never Smoker  . Smokeless tobacco: Never Used  Vaping Use  . Vaping Use: Never used  Substance and Sexual Activity  . Alcohol use: Yes    Alcohol/week: 21.0 standard drinks    Types: 21 Cans of beer per week    Comment: previously quite heavy, working on cessation   . Drug use: No  . Sexual activity: Not on file  Other Topics Concern  . Not on file  Social History Narrative   Lives in [place]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   Social Determinants of Health   Financial Resource Strain: Not on file  Food Insecurity: Not on file  Transportation Needs: Not on file  Physical Activity: Not on file  Stress: Not on file  Social Connections: Not on file    Allergies:  No Known Allergies  Objective:    Vital Signs:   Temp:  [97.6 F (36.4 C)] 97.6 F (36.4 C) (02/18 1209) Pulse Rate:  [76-79] 76 (02/18 1340) Resp:  [16-97] 25 (02/18 1340) BP: (97-121)/(59-104) 97/59 (02/18 1340) SpO2:  [95 %-100 %] 95 % (02/18 1340) Weight:  [131.5 kg] 131.5 kg (02/18 1209)   Filed Weights   08/03/20 1209  Weight: 131.5 kg     Physical Exam     General:  Morbidly appearing. No respiratory difficulty HEENT: Normal Neck: Supple. JVD elevated to ear Carotids 2+ bilat; no bruits. No lymphadenopathy or thyromegaly appreciated. Cor: PMI nondisplaced. Irregularly irregular rhythm. No rubs, gallops or murmurs. Lungs: Clear Abdomen: obese, soft, nontender, nondistended. No hepatosplenomegaly. No bruits or masses. Good bowel sounds. Extremities: No cyanosis, clubbing, rash, no peripheral edema. Distal extremities cool to touch  Neuro: Alert & oriented x 3, cranial nerves grossly intact. moves all 4 extremities w/o difficulty. Affect pleasant.   Telemetry   Atrial Fibrillation 80s   EKG   EKG 2/15 Afib RVR 123   Labs     Basic Metabolic Panel: Recent Labs  Lab 07/31/20 1557  NA 137  K 5.2  CL 97   CO2 22  GLUCOSE 60*  BUN 24  CREATININE 1.60*  CALCIUM 9.7    Liver Function Tests: No results for input(s): AST, ALT, ALKPHOS, BILITOT, PROT, ALBUMIN in the last 168 hours. No results for input(s): LIPASE, AMYLASE in the last 168 hours. No results for input(s): AMMONIA in the last 168 hours.  CBC: Recent Labs  Lab 07/31/20 1557  WBC 9.0  HGB 18.6*  HCT 57.4*  MCV 91  PLT 244    Cardiac Enzymes: No results for input(s): CKTOTAL, CKMB, CKMBINDEX, TROPONINI in the last 168 hours.  BNP: BNP (last 3 results) No results for input(s): BNP in the last 8760 hours.  ProBNP (last 3 results) No results for input(s): PROBNP in the last 8760 hours.   CBG: No results for input(s): GLUCAP in the last 168 hours.  Coagulation Studies: No results for input(s): LABPROT, INR in the last 72 hours.  Imaging:  No results found.    Assessment/Plan   1. Acute on Chronic Systolic Heart Failure - Felt NICM/ tachy-medidated from long h/o Afib - Echo 04/2018, down to 30-35%. RV normal. - Echo 10/20 EF 40-45%. RV normal  - Echo 3/21 EF 40-45%. G1DD. RV normal. - Lexiscan NST 10/20 showed no reversible ischemia. No cath given CKD  - TEE today w/ severe biventricular failure, LVEF now <10%, RV severely depressed + LV thrombus  - Volume overloaded w/ NYHA Class III symptoms - Admit to PCU for IV diuresis, Lasix 80 mg bid - Place PICC line to check Co-ox and follow CVPs  - Hold  blocker for now w/ concern for low output  - Hold home losartan and spiro for now w/ soft BP and borderline hyperkalemia (K 5.2) - start digoxin 0.125 - RHC on Monday, w/ potential LHC pending renal function  - Recommend eventual cardiac MRI    2. Persistent Atrial Fibrillation  - DCCV aborted due to LV thrombus noted on TEE - Currently rate controlled in the 80s  - Hold  blocker given concern for Low output HF - can use IV amiodarone if needed and/or if addition of inotropes  - continue  anticoagulation   3. LA appendageThrombus  - noted on TEE  - prior h/o LA appendage thrombus 6/21 on Xarelto>>switched to Pradaxa - He reports full compliance w/ Pradaxa - Switch to Coumadin w/ heparin  bridge   4. H/o Atrial Flutter - s/p AFL Ablation in 2012  5. Stage III CKD  - SCr 1.6 c/w baseline - follow BMP w/ diuresis   6. OSA - CPAP QHS   7. DM: - hold home meds for now - Glucose 60 in endocsopy, has been NPO  - RN to give Juice and order diet - monitor closely  - consider SLGT2i prior to d/c once stabilized.  Lyda Jester, PA-C 08/03/2020, 1:42 PM  Advanced Heart Failure Team Pager [phone] (M-F; 7a - 4p)  Please contact [facility] for night-coverage after hours (4p -7a ) and weekends on [email or web address]  Patient seen with PA, agree with the above note.   History as noted above.  Long h/o PAF, LA appendage thrombus noted on TEE in 6/20 while on Xarelto so switched to Pradaxa.  He spontaneously converted to NSR at that time.  Echo improved somewhat with EF 40-45% in 3/21.  He feels like he has been back in atrial fibrillation since late last week.  Felt irregular HR and developed dyspnea walking across his house as well as orthopnea.  Saw Dr. Harrell Gave, was in atrial fibrillation with RVR (123 bpm on office ECG).  He started on low dose metoprolol (had not been on chronically due to history of bradycardia) and set up for TEE-guided DCCV today.  He reports full compliance with Pradaxa.  TEE today showed EF < 10%, severely decreased RV systolic function, LA appendage thrombus.  No DCCV.  HR currently in 80s in atrial fibrillation with SBP 110s.  No dyspnea at rest.   General: NAD Neck: JVP 16 cm, no thyromegaly or thyroid nodule.  Lungs: Mild crackles at bases.  CV: Nondisplaced PMI.  Heart irregular S1/S2, no S3/S4, no murmur.  1+ edema to knees.  No carotid bruit.  Normal pedal pulses.  Abdomen: Soft, nontender, no hepatosplenomegaly, mild distention.  Skin:  Intact without lesions or rashes.  Neurologic: Alert and oriented x 3.  Psych: Normal affect. Extremities: No clubbing or cyanosis. Extremities are cool.  HEENT: Normal.   1. Acute on chronic systolic CHF: TEE today with EF <10%, severe RV dysfunction, LA appendage thrombus.  Echoes in the past have had EF ranging 30-45%.  Possible tachycardia-mediated cardiomyopathy as he seems to become markedly more symptomatic when in atrial fibrillation though interestingly, HR is not elevated today (in 80s).  Alternatively, he could have another cause for cardiomyopathy and atrial fibrillation just causes him to decompensate.  He is cool on exam, I am concerned for low output.  Creatinine at his baseline, 1.6.  He is significantly volume overloaded on exam with NYHA class IIIb symptoms for about a week (seem to date to onset of AF). He takes Lasix 40 mg po daily at home.  - Ultimately, would benefit from NSR but complicated by LA thrombus.  - Hold spironolactone and losartan for now with elevated K.  - Add digoxin 0.125 daily.  - Lasix 80 mg IV bid, give a dose now.  - Place PICC, follow co-ox and CVP.  If co-ox low, would use milrinone.  Can add amiodarone gtt for rate control if needed.   - Formal RHC +/- coronary angiography depending on creatinine on Monday.  2. Atrial fibrillation: Paroxysmal.  Ablation planned in 6/20 but had LA thrombus despite Xarelto use.  He converted back to NSR spontaneously.  Not on amiodarone or beta blockade due to reported history of bradycardia.  Had apparently been in NSR until last week, was seen this week in cardiology clinic with atrial fibrillation RVR around 120.  Today, HR in 80s.  He has been compliant with Pradaxa, but has LA appendage thrombus again on TEE today.  No DCCV therefore.   - With clots despite Xarelto and Pradaxa use, will start heparin gtt and bridge to therapeutic warfarin.  May need to aim for INR 2.5-3.   - Eventually would benefit from DCCV, but will  need to clear thrombus.  Long-term, would keep on amiodarone or aim for ablation.  3. CKD stage 3: Creatinine 1.6, seems to be near his baseline. Follow closely. 4. OSA: Continue CPAP.  5. Type 2 diabetes: Eventual SGLT2 inhibitor.   Loralie Champagne 08/03/2020 5:14 PM

## 2020-08-03 NOTE — Progress Notes (Signed)
Peripherally Inserted Central Catheter Placement  The IV Nurse has discussed with the patient and/or persons authorized to consent for the patient, the purpose of this procedure and the potential benefits and risks involved with this procedure.  The benefits include less needle sticks, lab draws from the catheter, and the patient may be discharged home with the catheter. Risks include, but not limited to, infection, bleeding, blood clot (thrombus formation), and puncture of an artery; nerve damage and irregular heartbeat and possibility to perform a PICC exchange if needed/ordered by physician.  Alternatives to this procedure were also discussed.  Bard Power PICC patient education guide, fact sheet on infection prevention and patient information card has been provided to patient /or left at bedside.    PICC Placement Documentation  PICC Double Lumen 73/22/02 PICC Right Basilic 44 cm 0 cm (Active)  Indication for Insertion or Continuance of Line Vasoactive infusions 08/03/20 1835  Exposed Catheter (cm) 0 cm 08/03/20 1835  Site Assessment Clean;Dry;Intact 08/03/20 1835  Lumen #1 Status Flushed;Saline locked;Blood return noted 08/03/20 1835  Lumen #2 Status Flushed;Saline locked;Blood return noted 08/03/20 1835  Dressing Type Transparent 08/03/20 1835  Dressing Status Clean;Dry;Intact 08/03/20 1835  Antimicrobial disc in place? Yes 08/03/20 [place]  Dressing Intervention New dressing 08/03/20 1835  Dressing Change Due 08/10/20 08/03/20 1835       Gordan Payment 08/03/2020, 6:36 PM

## 2020-08-03 NOTE — Op Note (Signed)
INDICATIONS: Atrial fibrillation precardioversion  PROCEDURE:   Informed consent was obtained prior to the procedure. The risks, benefits and alternatives for the procedure were discussed and the patient comprehended these risks.  Risks include, but are not limited to, cough, sore throat, vomiting, nausea, somnolence, esophageal and stomach trauma or perforation, bleeding, low blood pressure, aspiration, pneumonia, infection, trauma to the teeth and death.    After a procedural time-out, the oropharynx was anesthetized with 20% benzocaine spray.   During this procedure the patient was administered IV propofol by anesthesiology (Dr. Smith Robert).  The transesophageal probe was inserted in the esophagus and stomach without difficulty and multiple views were obtained.  The patient was kept under observation until the patient left the procedure room.  The patient left the procedure room in stable condition.   Agitated microbubble saline contrast was not administered.  COMPLICATIONS:    There were no immediate complications.  FINDINGS:  There is fresh appearing left atrial thrombus and dense spontaneous echo contrast in the left atrium, left ventricle and descending aorta. LVEF <10%. RVEF severely depressed as well. Cardioversion was not performed.  RECOMMENDATIONS:    Consider advanced HF team evaluation.  Time Spent Directly with the Patient:  45 minutes   [name] 08/03/2020, 1:20 PM

## 2020-08-03 NOTE — Anesthesia Postprocedure Evaluation (Signed)
Anesthesia Post Note  Patient: [name]  Procedure(s) Performed: TRANSESOPHAGEAL ECHOCARDIOGRAM (TEE) (N/A ) CARDIOVERSION (N/A )     Patient location during evaluation: PACU Anesthesia Type: MAC Level of consciousness: awake and alert Pain management: pain level controlled Vital Signs Assessment: post-procedure vital signs reviewed and stable Respiratory status: spontaneous breathing, nonlabored ventilation, respiratory function stable and patient connected to nasal cannula oxygen Cardiovascular status: stable and blood pressure returned to baseline Postop Assessment: no apparent nausea or vomiting Anesthetic complications: no   No complications documented.  Last Vitals:  Vitals:   08/03/20 1400 08/03/20 1536  BP: 110/89 (!) 116/99  Pulse: 74 85  Resp: 19 20  Temp:    SpO2: 90%     Last Pain:  Vitals:   08/03/20 1536  TempSrc: Oral  PainSc:                  Effie Berkshire

## 2020-08-03 NOTE — Anesthesia Preprocedure Evaluation (Addendum)
Anesthesia Evaluation  Patient identified by MRN, date of birth, ID band Patient awake    Reviewed: Allergy & Precautions, NPO status , Patient's Chart, lab work & pertinent test results  Airway Mallampati: I  TM Distance: >3 FB Neck ROM: Full    Dental  (+) Poor Dentition, Partial Upper   Pulmonary asthma , sleep apnea ,    breath sounds clear to auscultation       Cardiovascular hypertension, Pt. on medications and Pt. on home beta blockers +CHF  + dysrhythmias Atrial Fibrillation  Rhythm:Irregular Rate:Abnormal     Neuro/Psych negative neurological ROS  negative psych ROS   GI/Hepatic negative GI ROS, Neg liver ROS,   Endo/Other  diabetes, Type 2, Insulin DependentHypothyroidism   Renal/GU Renal InsufficiencyRenal disease     Musculoskeletal negative musculoskeletal ROS (+)   Abdominal Normal abdominal exam  (+)   Peds  Hematology negative hematology ROS (+)   Anesthesia Other Findings - HLD  Reproductive/Obstetrics                            Anesthesia Physical Anesthesia Plan  ASA: III  Anesthesia Plan: General   Post-op Pain Management:    Induction: Intravenous  PONV Risk Score and Plan: 0 and Propofol infusion  Airway Management Planned:   Additional Equipment: None  Intra-op Plan:   Post-operative Plan:   Informed Consent: I have reviewed the patients History and Physical, chart, labs and discussed the procedure including the risks, benefits and alternatives for the proposed anesthesia with the patient or authorized representative who has indicated his/her understanding and acceptance.       Plan Discussed with: CRNA  Anesthesia Plan Comments:        Anesthesia Quick Evaluation

## 2020-08-03 NOTE — Transfer of Care (Signed)
Immediate Anesthesia Transfer of Care Note  Patient: [name]  Procedure(s) Performed: TRANSESOPHAGEAL ECHOCARDIOGRAM (TEE) (N/A ) CARDIOVERSION (N/A )  Patient Location: Endoscopy Unit  Anesthesia Type:MAC  Level of Consciousness: drowsy  Airway & Oxygen Therapy: Patient Spontanous Breathing and Patient connected to face mask oxygen  Post-op Assessment: Report given to RN  Post vital signs: Reviewed and stable  Last Vitals:  Vitals Value Taken Time  BP 85/69 08/03/20 1334  Temp    Pulse 77 08/03/20 1337  Resp 23 08/03/20 1337  SpO2 98 % 08/03/20 1337  Vitals shown include unvalidated device data.  Last Pain:  Vitals:   08/03/20 1209  TempSrc: Oral  PainSc: 0-No pain         Complications: No complications documented.

## 2020-08-03 NOTE — Interval H&P Note (Signed)
History and Physical Interval Note:  08/03/2020 11:56 AM  [name]  has presented today for surgery, with the diagnosis of afib, remote h/o thrombus.  The various methods of treatment have been discussed with the patient and family. After consideration of risks, benefits and other options for treatment, the patient has consented to  Procedure(s): TRANSESOPHAGEAL ECHOCARDIOGRAM (TEE) (N/A) CARDIOVERSION (N/A) as a surgical intervention.  The patient's history has been reviewed, patient examined, no change in status, stable for surgery.  I have reviewed the patient's chart and labs.  Questions were answered to the patient's satisfaction.     Jermany Rimel

## 2020-08-03 NOTE — Progress Notes (Signed)
Verbal order received form provider to start Milrinone drip. Parameters given to start amiodarone drip if patient's HR sustains above 110. Will continue to monitor patient and implement orders as needed.

## 2020-08-03 NOTE — Care Plan (Signed)
Plan for patient to be admitted to hospital. Report called to bedside RN. Patient A/O ,VS stable. Rechecked BS 76, patient eating sandwich.

## 2020-08-04 ENCOUNTER — Inpatient Hospital Stay (HOSPITAL_COMMUNITY): Payer: Medicare HMO

## 2020-08-04 DIAGNOSIS — N179 Acute kidney failure, unspecified: Secondary | ICD-10-CM | POA: Diagnosis not present

## 2020-08-04 DIAGNOSIS — I5021 Acute systolic (congestive) heart failure: Secondary | ICD-10-CM

## 2020-08-04 DIAGNOSIS — I5023 Acute on chronic systolic (congestive) heart failure: Secondary | ICD-10-CM | POA: Diagnosis not present

## 2020-08-04 LAB — HEPATIC FUNCTION PANEL
ALT: 135 U/L — ABNORMAL HIGH (ref 0–44)
AST: 136 U/L — ABNORMAL HIGH (ref 15–41)
Albumin: 2.8 g/dL — ABNORMAL LOW (ref 3.5–5.0)
Alkaline Phosphatase: 61 U/L (ref 38–126)
Bilirubin, Direct: 0.4 mg/dL — ABNORMAL HIGH (ref 0.0–0.2)
Indirect Bilirubin: 1.1 mg/dL — ABNORMAL HIGH (ref 0.3–0.9)
Total Bilirubin: 1.5 mg/dL — ABNORMAL HIGH (ref 0.3–1.2)
Total Protein: 5.6 g/dL — ABNORMAL LOW (ref 6.5–8.1)

## 2020-08-04 LAB — BASIC METABOLIC PANEL
Anion gap: 13 (ref 5–15)
BUN: 44 mg/dL — ABNORMAL HIGH (ref 8–23)
CO2: 21 mmol/L — ABNORMAL LOW (ref 22–32)
Calcium: 8.3 mg/dL — ABNORMAL LOW (ref 8.9–10.3)
Chloride: 99 mmol/L (ref 98–111)
Creatinine, Ser: 2.16 mg/dL — ABNORMAL HIGH (ref 0.61–1.24)
GFR, Estimated: 33 mL/min — ABNORMAL LOW (ref >60)
Glucose, Bld: 120 mg/dL — ABNORMAL HIGH (ref 70–99)
Potassium: 4 mmol/L (ref 3.5–5.1)
Sodium: 133 mmol/L — ABNORMAL LOW (ref 135–145)

## 2020-08-04 LAB — COOXEMETRY PANEL
Carboxyhemoglobin: 1.4 % (ref 0.5–1.5)
Methemoglobin: 0.8 % (ref 0.0–1.5)
O2 Saturation: 64.5 %
Total hemoglobin: 15.5 g/dL (ref 12.0–16.0)

## 2020-08-04 LAB — ECHOCARDIOGRAM COMPLETE
Calc EF: 35.1 %
Height: 73 in
S' Lateral: 4.8 cm
Single Plane A2C EF: 34.5 %
Single Plane A4C EF: 35.4 %
Weight: 4659.2 oz

## 2020-08-04 LAB — GLUCOSE, CAPILLARY
Glucose-Capillary: 110 mg/dL — ABNORMAL HIGH (ref 70–99)
Glucose-Capillary: 139 mg/dL — ABNORMAL HIGH (ref 70–99)
Glucose-Capillary: 138 mg/dL — ABNORMAL HIGH (ref 70–99)
Glucose-Capillary: 165 mg/dL — ABNORMAL HIGH (ref 70–99)

## 2020-08-04 LAB — CBC
HCT: 48 % (ref 39.0–52.0)
Hemoglobin: 15.7 g/dL (ref 13.0–17.0)
MCH: 29.6 pg (ref 26.0–34.0)
MCHC: 32.7 g/dL (ref 30.0–36.0)
MCV: 90.6 fL (ref 80.0–100.0)
Platelets: 207 10*3/uL (ref 150–400)
RBC: 5.3 MIL/uL (ref 4.22–5.81)
RDW: 14.6 % (ref 11.5–15.5)
WBC: 9.2 10*3/uL (ref 4.0–10.5)
nRBC: 0 % (ref 0.0–0.2)

## 2020-08-04 LAB — LACTIC ACID, PLASMA
Lactic Acid, Venous: 1.2 mmol/L (ref 0.5–1.9)
Lactic Acid, Venous: 1.1 mmol/L (ref 0.5–1.9)

## 2020-08-04 LAB — APTT: aPTT: 93 seconds — ABNORMAL HIGH (ref 24–36)

## 2020-08-04 LAB — MAGNESIUM: Magnesium: 1.9 mg/dL (ref 1.7–2.4)

## 2020-08-04 LAB — PROTIME-INR
INR: 2.6 — ABNORMAL HIGH (ref 0.8–1.2)
Prothrombin Time: 27 seconds — ABNORMAL HIGH (ref 11.4–15.2)

## 2020-08-04 LAB — HEPARIN LEVEL (UNFRACTIONATED): Heparin Unfractionated: <0.1 IU/mL — ABNORMAL LOW (ref 0.30–0.70)

## 2020-08-04 LAB — HEMOGLOBIN A1C
Hgb A1c MFr Bld: 5.3 % (ref 4.8–5.6)
Mean Plasma Glucose: 105.41 mg/dL

## 2020-08-04 MED ORDER — INSULIN ASPART 100 UNIT/ML ~~LOC~~ SOLN: 0.0000 [IU] | Freq: Every day | SUBCUTANEOUS | Status: DC

## 2020-08-04 MED ORDER — INSULIN ASPART 100 UNIT/ML ~~LOC~~ SOLN
0.0000 [IU] | Freq: Three times a day (TID) | SUBCUTANEOUS | Status: DC
  Administered 2020-08-04 – 2020-08-05 (×3): 2 [IU] via SUBCUTANEOUS
  Administered 2020-08-05 – 2020-08-06 (×2): 3 [IU] via SUBCUTANEOUS
  Administered 2020-08-06 – 2020-08-08 (×5): 2 [IU] via SUBCUTANEOUS
  Administered 2020-08-08 – 2020-08-09 (×2): 3 [IU] via SUBCUTANEOUS
  Administered 2020-08-10: 2 [IU] via SUBCUTANEOUS

## 2020-08-04 MED ORDER — AMIODARONE HCL 200 MG PO TABS
200.0000 mg | ORAL_TABLET | Freq: Two times a day (BID) | ORAL | Status: DC
  Administered 2020-08-04 – 2020-08-05 (×3): 200 mg via ORAL
  Filled 2020-08-04 (×3): qty 1

## 2020-08-04 MED ORDER — PERFLUTREN LIPID MICROSPHERE
1.0000 mL | INTRAVENOUS | Status: AC | PRN
  Administered 2020-08-04: 3 mL via INTRAVENOUS
  Filled 2020-08-04: qty 10

## 2020-08-04 NOTE — Progress Notes (Signed)
[place] for heparin + warfarin Indication: LV Thrombus  No Known Allergies  Patient Measurements: Height: 6\' 1"  (185.4 cm) Weight: 132.1 kg (291 lb 3.2 oz) IBW/kg (Calculated) : 79.9 Heparin Dosing Weight: 110kg   Vital Signs: Temp: 97.6 F (36.4 C) (02/19 0400) Temp Source: Oral (02/19 0400) BP: 126/93 (02/19 0400) Pulse Rate: 95 (02/19 0406)  Labs: Recent Labs    08/04/20 0451  APTT 93*  LABPROT 27.0*  INR 2.6*  HEPARINUNFRC <0.10*  CREATININE 2.16*    Estimated Creatinine Clearance: 48.6 mL/min (A) (by C-G formula based on SCr of 2.16 mg/dL (H)).  Assessment: 64yom with Hx LV thrombus previously on rivaroxaban and most recently on dabigatran with ongoing LV thrombus per TEE.   Plan heparin bridge to warfarin with goal 2.5.   Plan was low dose warfarin over the weekend as [facility] planned Monday.  EF<20%   Heparin level elevated due to recent DOAC use, APTT is in goal. INR is elevated to 2.6 prior to warfarin. No signs of bleeding recorded. CBC is down slightly, within normal limits.  Goal of Therapy:  Heparin level 0.3-0.7 units/ml  APTT 66-103sec Monitor platelets by anticoagulation protocol: Yes   Plan:  Continue Heparin drip 1400 uts/hr Hold Warfarin Heparin level, aptt daily and protime in am  Monitor s/s bleeding  Norina Buzzard, PharmD PGY1 Pharmacy Resident 08/04/2020 7:51 AM

## 2020-08-04 NOTE — Progress Notes (Signed)
Patient ambulated in hall with RN assistance. Patient able to tolerate ~86ft on RA before increased work of breathing and oxygen saturations dropping to 86%. Patient oxygen recovered to 95% with rest and was able to ambulated back to room on RA and remain above 90%. Patient HR elevated to 130s with ambulation. In room back in bed patient oxygen at 96% on RA and HR still elevated but improved to 105-112.

## 2020-08-04 NOTE — Progress Notes (Signed)
Patient ID: LUKASZ ROGUS, male   DOB: 08-10-1955, 65 y.o.   MRN: [id number]     Advanced Heart Failure Rounding Note  PCP-Cardiologist: Buford Dresser, MD   Subjective:    Co-ox 35% and cool extremities initial admission, increased to 64.5% on milrinone 0.25 this morning.  Extremities now warm.  Creatinine 2.16 this morning, 1.6 most recent prior to 2/15. CVP 12 this morning.   INR 2.6 this morning, did not get warfarin.  He is on heparin gtt with LA appendage thrombus.   BP stable, HR 80s-90s atrial fibrillation.     Objective:   Weight Range: 132.1 kg Body mass index is 38.42 kg/m.   Vital Signs:   Temp:  [97.6 F (36.4 C)-98.5 F (36.9 C)] 98.4 F (36.9 C) (02/19 0745) Pulse Rate:  [41-96] 89 (02/19 0745) Resp:  [16-97] 19 (02/19 0745) BP: (97-144)/(59-104) 144/104 (02/19 0745) SpO2:  [77 %-100 %] 99 % (02/19 0745) Weight:  [131.5 kg-133.8 kg] 132.1 kg (02/19 0454) Last BM Date: 08/03/20  Weight change: Filed Weights   08/03/20 1209 08/03/20 1536 08/04/20 0454  Weight: 131.5 kg 133.8 kg 132.1 kg    Intake/Output:   Intake/Output Summary (Last 24 hours) at 08/04/2020 0755 Last data filed at 08/04/2020 0718 Gross per 24 hour  Intake 470.81 ml  Output 800 ml  Net -329.19 ml      Physical Exam    General:  Well appearing. No resp difficulty HEENT: Normal Neck: Supple. JVP 10 cm. Carotids 2+ bilat; no bruits. No lymphadenopathy or thyromegaly appreciated. Cor: PMI nondisplaced. Irregular rate & rhythm. No rubs, gallops or murmurs. Lungs: Clear Abdomen: Soft, nontender, nondistended. No hepatosplenomegaly. No bruits or masses. Good bowel sounds. Extremities: No cyanosis, clubbing, rash. 1+ ankle edema.  Neuro: Alert & orientedx3, cranial nerves grossly intact. moves all 4 extremities w/o difficulty. Affect pleasant   Telemetry   Atrial fibrillation 80s-90s, rate PVCs (personally reviewed)   Labs    CBC No results for input(s): WBC, NEUTROABS,  HGB, HCT, MCV, PLT in the last 72 hours. Basic Metabolic Panel Recent Labs    08/04/20 0451  NA 133*  K 4.0  CL 99  CO2 21*  GLUCOSE 120*  BUN 44*  CREATININE 2.16*  CALCIUM 8.3*  MG 1.9   Liver Function Tests No results for input(s): AST, ALT, ALKPHOS, BILITOT, PROT, ALBUMIN in the last 72 hours. No results for input(s): LIPASE, AMYLASE in the last 72 hours. Cardiac Enzymes No results for input(s): CKTOTAL, CKMB, CKMBINDEX, TROPONINI in the last 72 hours.  BNP: BNP (last 3 results) Recent Labs    08/03/20 1559  BNP 1,668.7*    ProBNP (last 3 results) No results for input(s): PROBNP in the last 8760 hours.   D-Dimer No results for input(s): DDIMER in the last 72 hours. Hemoglobin A1C No results for input(s): HGBA1C in the last 72 hours. Fasting Lipid Panel No results for input(s): CHOL, HDL, LDLCALC, TRIG, CHOLHDL, LDLDIRECT in the last 72 hours. Thyroid Function Tests No results for input(s): TSH, T4TOTAL, T3FREE, THYROIDAB in the last 72 hours.  Invalid input(s): FREET3  Other results:   Imaging    DG CHEST PORT 1 VIEW  Result Date: 08/03/2020 CLINICAL DATA:  PICC placement EXAM: PORTABLE CHEST 1 VIEW COMPARISON:  None. FINDINGS: Right-sided PICC catheter tip is in the lower SVC. Mild cardiomegaly. No focal airspace consolidation or pulmonary edema. IMPRESSION: Right-sided PICC catheter tip in the lower SVC. Electronically Signed   By: Ulyses Jarred  M.D.   On: 08/03/2020 19:06   ECHO TEE  Result Date: 08/03/2020    TRANSESOPHOGEAL ECHO REPORT   Patient Name:   HUEY SCALIA Date of Exam: 08/03/2020 Medical Rec #:  [id number]       Height:       73.0 in Accession #:    [id number]      Weight:       290.0 lb Date of Birth:  1956-05-16       BSA:          2.520 m Patient Age:    45 years        BP:           112/73 mmHg Patient Gender: M               HR:           79 bpm. Exam Location:  Inpatient Procedure: Transesophageal Echo, Color Doppler and Cardiac  Doppler Indications:     Atrial Fibrillation  History:         Patient has prior history of Echocardiogram examinations, most                  recent 09/04/2019. CHF and Non-ischemic Cardiomyopathy,                  Arrythmias:Atrial Fibrillation; Risk Factors:Hypertension,                  Dyslipidemia and Diabetes.  Sonographer:     Mikki Santee RDCS (AE) Referring Phys:  [place] Diagnosing Phys: Sanda Klein MD PROCEDURE: After discussion of the risks and benefits of a TEE, an informed consent was obtained from the patient. The transesophogeal probe was passed without difficulty through the esophogus of the patient. Sedation performed by different physician. The patient was monitored while under deep sedation. Anesthestetic sedation was provided intravenously by Anesthesiology: 98.63mg  of Propofol. The patient developed no complications during the procedure. IMPRESSIONS  1. Left ventricular thrombus is not seen. Left ventricular ejection fraction, by estimation, is <20%. The left ventricle has severely decreased function. The left ventricle demonstrates global hypokinesis. The left ventricular internal cavity size was moderately dilated. There is moderate concentric left ventricular hypertrophy. Left ventricular diastolic function could not be evaluated.  2. Right ventricular systolic function is severely reduced. The right ventricular size is severely enlarged. There is mildly elevated pulmonary artery systolic pressure.  3. Left atrial size was severely dilated. A left atrial/left atrial appendage thrombus was detected. The LAA emptying velocity was 5 cm/s.  4. Right atrial size was severely dilated.  5. The mitral valve is normal in structure. Mild to moderate mitral valve regurgitation.  6. Tricuspid valve regurgitation is moderate.  7. The aortic valve is tricuspid. Aortic valve regurgitation is not visualized.  8. There is spontaneous echo contrast in the aortic arch and descending aorta,  consistent with severely reduced cardiac output. Aortic dilatation noted. There is mild dilatation of the ascending aorta, measuring 41 mm. There is Moderate (Grade III) protruding plaque involving the descending aorta and transverse aorta. FINDINGS  Left Ventricle: Left ventricular thrombus is not seen. Left ventricular ejection fraction, by estimation, is <20%. The left ventricle has severely decreased function. The left ventricle demonstrates global hypokinesis. The left ventricular internal cavity size was moderately dilated. There is moderate concentric left ventricular hypertrophy. Left ventricular diastolic function could not be evaluated due to atrial fibrillation. Left ventricular diastolic function could not be evaluated. Right  Ventricle: The right ventricular size is severely enlarged. No increase in right ventricular wall thickness. Right ventricular systolic function is severely reduced. There is mildly elevated pulmonary artery systolic pressure. The tricuspid regurgitant velocity is 2.34 m/s, and with an assumed right atrial pressure of 15 mmHg, the estimated right ventricular systolic pressure is 88.5 mmHg. Left Atrium: Left atrial size was severely dilated. Spontaneous echo contrast was present in the left atrial appendage and left atrium. A left atrial/left atrial appendage thrombus was detected. The LAA emptying velocity was 5 cm/s. Right Atrium: Right atrial size was severely dilated. Pericardium: There is no evidence of pericardial effusion. Mitral Valve: The mitral valve is normal in structure. Mild to moderate mitral valve regurgitation, with centrally-directed jet. Tricuspid Valve: The tricuspid valve is normal in structure. Tricuspid valve regurgitation is moderate. Aortic Valve: The aortic valve is tricuspid. Aortic valve regurgitation is not visualized. Pulmonic Valve: The pulmonic valve was grossly normal. Pulmonic valve regurgitation is not visualized. Aorta: There is spontaneous echo  contrast in the aortic arch and descending aorta, consistent with severely reduced cardiac output. Aortic dilatation noted. There is mild dilatation of the ascending aorta, measuring 41 mm. There is moderate (Grade III) protruding plaque involving the descending aorta and transverse aorta. IAS/Shunts: No atrial level shunt detected by color flow Doppler.  TRICUSPID VALVE TR Peak grad:   21.9 mmHg TR Vmax:        234.00 cm/s Sanda Klein MD Electronically signed by Sanda Klein MD Signature Date/Time: 08/03/2020/4:09:29 PM    Final    [place] EKG SITE RITE  Result Date: 08/03/2020 If Site Rite image not attached, placement could not be confirmed due to current cardiac rhythm.  [place] EKG SITE RITE  Result Date: 08/03/2020 If Site Rite image not attached, placement could not be confirmed due to current cardiac rhythm.     Medications:     Scheduled Medications: . amiodarone  200 mg Oral BID  . Chlorhexidine Gluconate Cloth  6 each Topical Daily  . digoxin  0.125 mg Oral Daily  . furosemide  80 mg Intravenous BID  . levothyroxine  50 mcg Oral Q0600  . rosuvastatin  10 mg Oral Daily  . sodium chloride flush  10-40 mL Intracatheter Q12H  . sodium chloride flush  3 mL Intravenous Q12H  . sodium chloride flush  3 mL Intravenous Q12H  . Warfarin - Pharmacist Dosing Inpatient   Does not apply q1600     Infusions: . sodium chloride    . heparin 1,400 Units/hr (08/04/20 0322)  . milrinone 0.25 mcg/kg/min (08/04/20 0720)     PRN Medications:  sodium chloride, acetaminophen, sodium chloride flush, sodium chloride flush    Assessment/Plan   1. Acute on chronic systolic CHF: TEE 0/27 with EF <10%, severe RV dysfunction, LA appendage thrombus. Echoes in the past have had EF ranging 30-45%.  Possible tachycardia-mediated cardiomyopathy as he seems to become markedly more symptomatic when in atrial fibrillation though interestingly, HR is not elevated today (in 80s).  Alternatively, he could  have another cause for cardiomyopathy and atrial fibrillation just causes him to decompensate.  Cool extremities initially with co-ox 35%, up to 64.5% this morning with milrinone 0.25.  Creatinine 2.16 (up from baseline around 1.6).  CVP 12 today.   - Ultimately, would benefit from NSR but complicated by LA thrombus.  - Hold spironolactone and losartan for now with AKI on CKD stage 3.  - Continue current milrinone 0.25, co-ox improved. Will send lactate.  -  Can continue digoxin 0.125 daily but stop if creatinine rises further.  - Lasix 80 mg IV bid for now.   - Formal RHC +/- coronary angiography depending on creatinine on Monday.  2. Atrial fibrillation: Paroxysmal.  Ablation planned in 6/20 but had LA thrombus despite Xarelto use.  He converted back to NSR spontaneously.  Not on amiodarone or beta blockade due to reported history of bradycardia.  Had apparently been in NSR until last week, was seen this week in cardiology clinic with atrial fibrillation RVR around 120.  Today, HR in 80s.  He has been compliant with Pradaxa, but has LA appendage thrombus again on TEE 2/18, no DCCV therefore.  INR 2.6 today without warfarin, concern for liver failure in setting of low output HF.  - With clots despite Xarelto and Pradaxa use, I started heparin gtt with plan to start warfarin.  May need to aim for INR 2.5-3.  Warfarin not started yet due to baseline high INR in setting of suspected liver failure => check LFTs.  - Eventually would benefit from DCCV, but will need to clear thrombus.   - Will start amiodarone 200 mg bid to help keep in NSR once he is cardioverted in the future.  Long-term, would keep on amiodarone or aim for ablation.  3. AKI on CKD stage 3: Creatinine 1.6 seems to be near his baseline, 2.16 today.  Suspect cardiorenal.  Follow closely.  4. OSA: Continue CPAP.  5. Type 2 diabetes: Eventual SGLT2 inhibitor, cover with SSI.  6. Liver dysfunction: Suspected from elevated INR in absence of  warfarin.  - Check LFTs today.   Ambulate in hall.    Length of Stay: 1  Loralie Champagne, MD  08/04/2020, 7:55 AM  Advanced Heart Failure Team Pager [phone] (M-F; 7a - 4p)  Please contact [facility] for night-coverage after hours (4p -7a ) and weekends on [email or web address]

## 2020-08-04 NOTE — Progress Notes (Signed)
*  PRELIMINARY RESULTS* Echocardiogram 2D Echocardiogram has been performed with Definity.  Samuel Germany 08/04/2020, 4:38 PM

## 2020-08-05 ENCOUNTER — Encounter (HOSPITAL_COMMUNITY): Payer: Self-pay | Admitting: Cardiovascular Disease

## 2020-08-05 DIAGNOSIS — I5023 Acute on chronic systolic (congestive) heart failure: Secondary | ICD-10-CM | POA: Diagnosis not present

## 2020-08-05 LAB — CBC
HCT: 46.3 % (ref 39.0–52.0)
Hemoglobin: 16 g/dL (ref 13.0–17.0)
MCH: 30.9 pg (ref 26.0–34.0)
MCHC: 34.6 g/dL (ref 30.0–36.0)
MCV: 89.4 fL (ref 80.0–100.0)
Platelets: 190 10*3/uL (ref 150–400)
RBC: 5.18 MIL/uL (ref 4.22–5.81)
RDW: 14.9 % (ref 11.5–15.5)
WBC: 7.8 10*3/uL (ref 4.0–10.5)
nRBC: 0 % (ref 0.0–0.2)

## 2020-08-05 LAB — PROTIME-INR
INR: 1.7 — ABNORMAL HIGH (ref 0.8–1.2)
Prothrombin Time: 19.6 seconds — ABNORMAL HIGH (ref 11.4–15.2)

## 2020-08-05 LAB — COMPREHENSIVE METABOLIC PANEL
ALT: 163 U/L — ABNORMAL HIGH (ref 0–44)
AST: 131 U/L — ABNORMAL HIGH (ref 15–41)
Albumin: 2.8 g/dL — ABNORMAL LOW (ref 3.5–5.0)
Alkaline Phosphatase: 69 U/L (ref 38–126)
Anion gap: 10 (ref 5–15)
BUN: 40 mg/dL — ABNORMAL HIGH (ref 8–23)
CO2: 26 mmol/L (ref 22–32)
Calcium: 8.4 mg/dL — ABNORMAL LOW (ref 8.9–10.3)
Chloride: 97 mmol/L — ABNORMAL LOW (ref 98–111)
Creatinine, Ser: 1.99 mg/dL — ABNORMAL HIGH (ref 0.61–1.24)
GFR, Estimated: 37 mL/min — ABNORMAL LOW (ref >60)
Glucose, Bld: 206 mg/dL — ABNORMAL HIGH (ref 70–99)
Potassium: 3.4 mmol/L — ABNORMAL LOW (ref 3.5–5.1)
Sodium: 133 mmol/L — ABNORMAL LOW (ref 135–145)
Total Bilirubin: 1.6 mg/dL — ABNORMAL HIGH (ref 0.3–1.2)
Total Protein: 5.8 g/dL — ABNORMAL LOW (ref 6.5–8.1)

## 2020-08-05 LAB — COOXEMETRY PANEL
Carboxyhemoglobin: 1.4 % (ref 0.5–1.5)
Methemoglobin: 0.7 % (ref 0.0–1.5)
O2 Saturation: 62.7 %
Total hemoglobin: 16.1 g/dL — ABNORMAL HIGH (ref 12.0–16.0)

## 2020-08-05 LAB — HEPARIN LEVEL (UNFRACTIONATED)
Heparin Unfractionated: 0.1 IU/mL — ABNORMAL LOW (ref 0.30–0.70)
Heparin Unfractionated: <0.1 IU/mL — ABNORMAL LOW (ref 0.30–0.70)
Heparin Unfractionated: 0.4 IU/mL (ref 0.30–0.70)

## 2020-08-05 LAB — GLUCOSE, CAPILLARY
Glucose-Capillary: 107 mg/dL — ABNORMAL HIGH (ref 70–99)
Glucose-Capillary: 166 mg/dL — ABNORMAL HIGH (ref 70–99)
Glucose-Capillary: 126 mg/dL — ABNORMAL HIGH (ref 70–99)
Glucose-Capillary: 170 mg/dL — ABNORMAL HIGH (ref 70–99)

## 2020-08-05 MED ORDER — AMIODARONE LOAD VIA INFUSION
150.0000 mg | Freq: Once | INTRAVENOUS | Status: AC
  Administered 2020-08-05: 150 mg via INTRAVENOUS
  Filled 2020-08-05: qty 83.34

## 2020-08-05 MED ORDER — ASPIRIN 81 MG PO CHEW
81.0000 mg | CHEWABLE_TABLET | ORAL | Status: AC
  Administered 2020-08-06: 81 mg via ORAL
  Filled 2020-08-05: qty 1

## 2020-08-05 MED ORDER — SODIUM CHLORIDE 0.9% FLUSH: 3.0000 mL | INTRAVENOUS | Status: DC | PRN

## 2020-08-05 MED ORDER — AMIODARONE HCL IN DEXTROSE 360-4.14 MG/200ML-% IV SOLN
60.0000 mg/h | INTRAVENOUS | Status: AC
  Administered 2020-08-05: 60 mg/h via INTRAVENOUS
  Filled 2020-08-05: qty 200

## 2020-08-05 MED ORDER — HEPARIN BOLUS VIA INFUSION
2000.0000 [IU] | Freq: Once | INTRAVENOUS | Status: AC
  Administered 2020-08-05: 2000 [IU] via INTRAVENOUS
  Filled 2020-08-05: qty 2000

## 2020-08-05 MED ORDER — SODIUM CHLORIDE 0.9 % IV SOLN: 250.0000 mL | INTRAVENOUS | Status: DC | PRN

## 2020-08-05 MED ORDER — SPIRONOLACTONE 12.5 MG HALF TABLET
12.5000 mg | ORAL_TABLET | Freq: Every day | ORAL | Status: DC
  Administered 2020-08-05: 12.5 mg via ORAL
  Filled 2020-08-05 (×3): qty 1

## 2020-08-05 MED ORDER — POTASSIUM CHLORIDE CRYS ER 20 MEQ PO TBCR
40.0000 meq | EXTENDED_RELEASE_TABLET | Freq: Once | ORAL | Status: AC
  Administered 2020-08-05: 40 meq via ORAL
  Filled 2020-08-05: qty 2

## 2020-08-05 MED ORDER — METOPROLOL SUCCINATE ER 25 MG PO TB24
12.5000 mg | ORAL_TABLET | Freq: Every day | ORAL | Status: DC
  Administered 2020-08-05 – 2020-08-10 (×6): 12.5 mg via ORAL
  Filled 2020-08-05 (×6): qty 1

## 2020-08-05 MED ORDER — SODIUM CHLORIDE 0.9 % IV SOLN: INTRAVENOUS | Status: DC

## 2020-08-05 MED ORDER — AMIODARONE HCL IN DEXTROSE 360-4.14 MG/200ML-% IV SOLN
30.0000 mg/h | INTRAVENOUS | Status: DC
  Administered 2020-08-05 – 2020-08-09 (×9): 30 mg/h via INTRAVENOUS
  Filled 2020-08-05 (×12): qty 200

## 2020-08-05 NOTE — Progress Notes (Signed)
Patient ID: [name], male   DOB: 1955/06/22, 65 y.o.   MRN: [id number]     Advanced Heart Failure Rounding Note  PCP-Cardiologist: Buford Dresser, MD   Subjective:    Co-ox 35% and cool extremities initial admission, increased to 63% on milrinone 0.25 this morning.  Extremities now warm.  Creatinine 2.16 => 1.99, baseline around 1.6.  CVP 6 this morning, very good diuresis yesterday.    INR 1.7 this morning.  He is on heparin gtt with LA appendage thrombus.   BP stable but HR increased 110s-120s this morning in atrial fibrillation.     Objective:   Weight Range: 128 kg Body mass index is 37.23 kg/m.   Vital Signs:   Temp:  [97.4 F (36.3 C)-98.4 F (36.9 C)] 98 F (36.7 C) (02/20 0820) Pulse Rate:  [82-116] 112 (02/20 0820) Resp:  [14-20] 15 (02/20 0820) BP: (119-151)/(85-117) 142/108 (02/20 0820) SpO2:  [94 %-99 %] 98 % (02/20 0820) Weight:  [128 kg] 128 kg (02/20 0551) Last BM Date: 08/03/20  Weight change: Filed Weights   08/03/20 1536 08/04/20 0454 08/05/20 0551  Weight: 133.8 kg 132.1 kg 128 kg    Intake/Output:   Intake/Output Summary (Last 24 hours) at 08/05/2020 0959 Last data filed at 08/05/2020 0900 Gross per 24 hour  Intake 1186 ml  Output 6960 ml  Net -5774 ml      Physical Exam    General: NAD Neck: No JVD, no thyromegaly or thyroid nodule.  Lungs: Clear to auscultation bilaterally with normal respiratory effort. CV: Nondisplaced PMI.  Heart mildly tachy irregular S1/S2, no S3/S4, no murmur.  No peripheral edema.   Abdomen: Soft, nontender, no hepatosplenomegaly, no distention.  Skin: Intact without lesions or rashes.  Neurologic: Alert and oriented x 3.  Psych: Normal affect. Extremities: No clubbing or cyanosis.  HEENT: Normal.    Telemetry   Atrial fibrillation 110s-120s, rare PVCs (personally reviewed)   Labs    CBC Recent Labs    08/04/20 0842 08/05/20 0334  WBC 9.2 7.8  HGB 15.7 16.0  HCT 48.0 46.3  MCV 90.6  89.4  PLT 207 962   Basic Metabolic Panel Recent Labs    08/04/20 0451 08/05/20 0334  NA 133* 133*  K 4.0 3.4*  CL 99 97*  CO2 21* 26  GLUCOSE 120* 206*  BUN 44* 40*  CREATININE 2.16* 1.99*  CALCIUM 8.3* 8.4*  MG 1.9  --    Liver Function Tests Recent Labs    08/04/20 0842 08/05/20 0334  AST 136* 131*  ALT 135* 163*  ALKPHOS 61 69  BILITOT 1.5* 1.6*  PROT 5.6* 5.8*  ALBUMIN 2.8* 2.8*   No results for input(s): LIPASE, AMYLASE in the last 72 hours. Cardiac Enzymes No results for input(s): CKTOTAL, CKMB, CKMBINDEX, TROPONINI in the last 72 hours.  BNP: BNP (last 3 results) Recent Labs    08/03/20 1559  BNP 1,668.7*    ProBNP (last 3 results) No results for input(s): PROBNP in the last 8760 hours.   D-Dimer No results for input(s): DDIMER in the last 72 hours. Hemoglobin A1C Recent Labs    08/04/20 0845  HGBA1C 5.3   Fasting Lipid Panel No results for input(s): CHOL, HDL, LDLCALC, TRIG, CHOLHDL, LDLDIRECT in the last 72 hours. Thyroid Function Tests No results for input(s): TSH, T4TOTAL, T3FREE, THYROIDAB in the last 72 hours.  Invalid input(s): FREET3  Other results:   Imaging    ECHOCARDIOGRAM COMPLETE  Result Date: 08/04/2020  ECHOCARDIOGRAM REPORT   Patient Name:   [name] Date of Exam: 08/04/2020 Medical Rec #:  [id number]       Height:       73.0 in Accession #:    [id number]      Weight:       291.2 lb Date of Birth:  Dec 27, 1955       BSA:          2.524 m Patient Age:    14 years        BP:           144/104 mmHg Patient Gender: M               HR:           101 bpm. Exam Location:  Inpatient Procedure: 2D Echo, Cardiac Doppler and Color Doppler Indications:    CHF-Acute Systolic W58.09  History:        Patient has prior history of Echocardiogram examinations, most                 recent 09/04/2019. CHF and Cardiomyopathy, Arrythmias:Atrial                 Flutter and Atrial Fibrillation; Risk Factors:Hypertension,                  Dyslipidemia and Diabetes. COVID-19 virus infection (Resolved),                 MORBID OBESITY.  Sonographer:    Alvino Chapel RCS Referring Phys: Benson  1. Left ventricular ejection fraction, by estimation, is 30 to 35%. The left ventricle has moderately decreased function. The left ventricle demonstrates global hypokinesis. The left ventricular internal cavity size was mildly dilated. There is severe asymmetric left ventricular hypertrophy of the basal-septal segment. Left ventricular diastolic parameters are indeterminate.  2. Right ventricular systolic function is moderately reduced. The right ventricular size is normal. There is normal pulmonary artery systolic pressure. The estimated right ventricular systolic pressure is 98.3 mmHg.  3. Left atrial size was severely dilated.  4. Right atrial size was moderately dilated.  5. The mitral valve is normal in structure. Trivial mitral valve regurgitation.  6. The aortic valve was not well visualized. Aortic valve regurgitation is not visualized. No aortic stenosis is present.  7. The inferior vena cava is dilated in size with >50% respiratory variability, suggesting right atrial pressure of 8 mmHg. FINDINGS  Left Ventricle: Left ventricular ejection fraction, by estimation, is 30 to 35%. The left ventricle has moderately decreased function. The left ventricle demonstrates global hypokinesis. Definity contrast agent was given IV to delineate the left ventricular endocardial borders. The left ventricular internal cavity size was mildly dilated. There is severe asymmetric left ventricular hypertrophy of the basal-septal segment. Left ventricular diastolic parameters are indeterminate. Right Ventricle: The right ventricular size is normal. Right vetricular wall thickness was not well visualized. Right ventricular systolic function is moderately reduced. There is normal pulmonary artery systolic pressure. The tricuspid regurgitant velocity  is 2.15 m/s, and with an assumed right atrial pressure of 8 mmHg, the estimated right ventricular systolic pressure is 38.2 mmHg. Left Atrium: Left atrial size was severely dilated. Right Atrium: Right atrial size was moderately dilated. Pericardium: There is no evidence of pericardial effusion. Mitral Valve: The mitral valve is normal in structure. Trivial mitral valve regurgitation. Tricuspid Valve: The tricuspid valve is normal in structure. Tricuspid valve regurgitation is trivial. Aortic Valve:  The aortic valve was not well visualized. Aortic valve regurgitation is not visualized. No aortic stenosis is present. Pulmonic Valve: The pulmonic valve was not well visualized. Pulmonic valve regurgitation is not visualized. Aorta: The aortic root is normal in size and structure. Venous: The inferior vena cava is dilated in size with greater than 50% respiratory variability, suggesting right atrial pressure of 8 mmHg. IAS/Shunts: The interatrial septum was not well visualized.  LEFT VENTRICLE PLAX 2D LVIDd:         5.90 cm LVIDs:         4.80 cm LV PW:         1.30 cm LV IVS:        1.55 cm LVOT diam:     2.30 cm LV SV:         31 LV SV Index:   12 LVOT Area:     4.15 cm  LV Volumes (MOD) LV vol d, MOD A2C: 113.0 ml LV vol d, MOD A4C: 125.0 ml LV vol s, MOD A2C: 74.0 ml LV vol s, MOD A4C: 80.7 ml LV SV MOD A2C:     39.0 ml LV SV MOD A4C:     125.0 ml LV SV MOD BP:      41.9 ml RIGHT VENTRICLE TAPSE (M-mode): 1.2 cm LEFT ATRIUM              Index       RIGHT ATRIUM           Index LA diam:        5.70 cm  2.26 cm/m  RA Area:     29.60 cm LA Vol (A2C):   166.0 ml 65.77 ml/m RA Volume:   104.00 ml 41.20 ml/m LA Vol (A4C):   201.0 ml 79.63 ml/m LA Biplane Vol: 183.0 ml 72.50 ml/m  AORTIC VALVE LVOT Vmax:   63.73 cm/s LVOT Vmean:  39.700 cm/s LVOT VTI:    0.074 m  AORTA Ao Root diam: 3.90 cm MV E velocity: 85.70 cm/s  TRICUSPID VALVE                            TR Peak grad:   18.5 mmHg                            TR  Vmax:        215.00 cm/s                             SHUNTS                            Systemic VTI:  0.07 m                            Systemic Diam: 2.30 cm Oswaldo Milian MD Electronically signed by Oswaldo Milian MD Signature Date/Time: 08/04/2020/5:46:09 PM    Final      Medications:     Scheduled Medications: . amiodarone  150 mg Intravenous Once  . Chlorhexidine Gluconate Cloth  6 each Topical Daily  . digoxin  0.125 mg Oral Daily  . insulin aspart  0-15 Units Subcutaneous TID WC  . insulin aspart  0-5 Units Subcutaneous QHS  . levothyroxine  50 mcg Oral Q0600  .  metoprolol succinate  12.5 mg Oral Daily  . potassium chloride  40 mEq Oral Once  . rosuvastatin  10 mg Oral Daily  . sodium chloride flush  10-40 mL Intracatheter Q12H  . sodium chloride flush  3 mL Intravenous Q12H  . sodium chloride flush  3 mL Intravenous Q12H  . spironolactone  12.5 mg Oral Daily  . Warfarin - Pharmacist Dosing Inpatient   Does not apply q1600    Infusions: . sodium chloride    . amiodarone     Followed by  . amiodarone    . heparin 1,800 Units/hr (08/05/20 0529)  . milrinone 0.25 mcg/kg/min (08/05/20 0304)    PRN Medications: sodium chloride, acetaminophen, sodium chloride flush, sodium chloride flush    Assessment/Plan   1. Acute on chronic systolic CHF: TEE 0/62 with EF <10%, severe RV dysfunction, LA appendage thrombus. Echoes in the past have had EF ranging 30-45%.  Possible tachycardia-mediated cardiomyopathy as he seems to become markedly more symptomatic when in atrial fibrillation though interestingly, HR initially was not elevated today (in 80s).  Alternatively, he could have another cause for cardiomyopathy and atrial fibrillation just causes him to decompensate.  Echo 2/19 with EF 25-30% on my review, moderately decreased RV systolic function. Cool extremities initially with co-ox 35%, up to 63% this morning with milrinone 0.25.  Creatinine 2.16 => 1.99.  Good  diuresis yesterday, CVP down to 6.    - Ultimately, would benefit from NSR but complicated by LA thrombus.   - Continue current milrinone 0.25, co-ox improved. Will start to wean possibly tomorrow (would like to see creatinine continue to trend down).  - Digoxin 0.125 daily.  - Had Lasix 80 mg IV this morning, will stop with CVP 6.   - Formal RHC +/- coronary angiography depending on creatinine on Monday.  Need to rule out coronary disease as underlying cause for cardiomyopathy (if not ischemic CMP, likely tachy-mediated CMP). Discussed risks/benefits with patient and he agrees to procedure.  2. Atrial fibrillation: Paroxysmal.  Ablation planned in 6/20 but had LA thrombus despite Xarelto use.  He converted back to NSR spontaneously.  Not on amiodarone or beta blockade due to reported history of bradycardia.  Had apparently been in NSR until last week, was seen this week in cardiology clinic with atrial fibrillation RVR around 120.  He has been compliant with Pradaxa, but had LA appendage thrombus again seen on TEE 2/18, no DCCV therefore.  Today, HR up in 110s-120s on milrinone gtt.  - Will transition to IV amiodarone for rate control.  - With clots despite Xarelto and Pradaxa use, I started heparin gtt with plan to start warfarin.  May need to aim for INR 2.5-3.  Start warfarin after cath tomorrow.  - Eventually would benefit from DCCV, but will need to clear thrombus.   - Will start amiodarone 200 mg bid to help keep in NSR once he is cardioverted in the future.  Long-term, would keep on amiodarone or aim for ablation.  3. AKI on CKD stage 3: Creatinine 1.6 seems to be near his baseline. 2.16 at admission, 1.99 today.  Suspect cardiorenal.  Follow closely.  4. OSA: Continue CPAP.  5. Type 2 diabetes: Eventual SGLT2 inhibitor, cover with SSI.  6. Elevated LFTs: Suspect congestive hepatopathy.    Ambulate in hall.    Length of Stay: 2  Loralie Champagne, MD  08/05/2020, 9:59 AM  Advanced Heart  Failure Team Pager [phone] (M-F; 7a - 4p)  Please contact  [facility] for night-coverage after hours (4p -7a ) and weekends on [email or web address]

## 2020-08-05 NOTE — Progress Notes (Signed)
ANTICOAGULATION CONSULT NOTE - Follow Up Consult  Pharmacy Consult for heparin Indication: LV thrombus  Heparin Dosing Weight: 110kg   Labs: Recent Labs    08/04/20 0451 08/04/20 0842 08/05/20 0334 08/05/20 1058 08/05/20 2029  HGB  --  15.7 16.0  --   --   HCT  --  48.0 46.3  --   --   PLT  --  207 190  --   --   APTT 93*  --   --   --   --   LABPROT 27.0*  --  19.6*  --   --   INR 2.6*  --  1.7*  --   --   HEPARINUNFRC <0.10*  --  0.10* <0.10* 0.40  CREATININE 2.16*  --  1.99*  --   --     Assessment: 64yo male subtherapeutic on heparin with initial dosing while transitioning from Pradaxa to Coumadin. Holding coumadin currently.  Currently on IV heparin at 2100 units/hr/ Heparin level is therapeutic at 0.4. RN reports no s/s of bleeding   Goal of Therapy:  Heparin level 0.3-0.7 units/ml   Plan:  Continue IV heparin at 2100 units/hr F/u AM HL  Daily HL and CBC    Albertina Parr, PharmD., BCPS, BCCCP Clinical Pharmacist Please refer to [facility] for unit-specific pharmacist

## 2020-08-05 NOTE — Progress Notes (Signed)
ANTICOAGULATION CONSULT NOTE - Follow Up Consult  Pharmacy Consult for heparin Indication: LV thrombus  Heparin Dosing Weight: 110kg   Labs: Recent Labs    08/04/20 0451 08/04/20 0842 08/05/20 0334 08/05/20 1058  HGB  --  15.7 16.0  --   HCT  --  48.0 46.3  --   PLT  --  207 190  --   APTT 93*  --   --   --   LABPROT 27.0*  --  19.6*  --   INR 2.6*  --  1.7*  --   HEPARINUNFRC <0.10*  --  0.10* <0.10*  CREATININE 2.16*  --  1.99*  --     Assessment: 64yo male subtherapeutic on heparin with initial dosing while transitioning from Pradaxa to Coumadin. Holding coumadin currently. CBC is stable, no overt bleeding noted.   Goal of Therapy:  Heparin level 0.3-0.7 units/ml   Plan:  Will give heparin bolus of 2000 units and increase heparin gtt by 3-4 units/kg/hr to 2100 units/hr and check level in 6 hours. Daily HL and CBC    Norina Buzzard, PharmD PGY1 Pharmacy Resident 08/05/2020 12:25 PM

## 2020-08-05 NOTE — H&P (View-Only) (Signed)
Patient ID: [name], male   DOB: 1955-07-12, 65 y.o.   MRN: [id number]     Advanced Heart Failure Rounding Note  PCP-Cardiologist: Buford Dresser, MD   Subjective:    Co-ox 35% and cool extremities initial admission, increased to 63% on milrinone 0.25 this morning.  Extremities now warm.  Creatinine 2.16 => 1.99, baseline around 1.6.  CVP 6 this morning, very good diuresis yesterday.    INR 1.7 this morning.  He is on heparin gtt with LA appendage thrombus.   BP stable but HR increased 110s-120s this morning in atrial fibrillation.     Objective:   Weight Range: 128 kg Body mass index is 37.23 kg/m.   Vital Signs:   Temp:  [97.4 F (36.3 C)-98.4 F (36.9 C)] 98 F (36.7 C) (02/20 0820) Pulse Rate:  [82-116] 112 (02/20 0820) Resp:  [14-20] 15 (02/20 0820) BP: (119-151)/(85-117) 142/108 (02/20 0820) SpO2:  [94 %-99 %] 98 % (02/20 0820) Weight:  [128 kg] 128 kg (02/20 0551) Last BM Date: 08/03/20  Weight change: Filed Weights   08/03/20 1536 08/04/20 0454 08/05/20 0551  Weight: 133.8 kg 132.1 kg 128 kg    Intake/Output:   Intake/Output Summary (Last 24 hours) at 08/05/2020 0959 Last data filed at 08/05/2020 0900 Gross per 24 hour  Intake 1186 ml  Output 6960 ml  Net -5774 ml      Physical Exam    General: NAD Neck: No JVD, no thyromegaly or thyroid nodule.  Lungs: Clear to auscultation bilaterally with normal respiratory effort. CV: Nondisplaced PMI.  Heart mildly tachy irregular S1/S2, no S3/S4, no murmur.  No peripheral edema.   Abdomen: Soft, nontender, no hepatosplenomegaly, no distention.  Skin: Intact without lesions or rashes.  Neurologic: Alert and oriented x 3.  Psych: Normal affect. Extremities: No clubbing or cyanosis.  HEENT: Normal.    Telemetry   Atrial fibrillation 110s-120s, rare PVCs (personally reviewed)   Labs    CBC Recent Labs    08/04/20 0842 08/05/20 0334  WBC 9.2 7.8  HGB 15.7 16.0  HCT 48.0 46.3  MCV 90.6  89.4  PLT 207 222   Basic Metabolic Panel Recent Labs    08/04/20 0451 08/05/20 0334  NA 133* 133*  K 4.0 3.4*  CL 99 97*  CO2 21* 26  GLUCOSE 120* 206*  BUN 44* 40*  CREATININE 2.16* 1.99*  CALCIUM 8.3* 8.4*  MG 1.9  --    Liver Function Tests Recent Labs    08/04/20 0842 08/05/20 0334  AST 136* 131*  ALT 135* 163*  ALKPHOS 61 69  BILITOT 1.5* 1.6*  PROT 5.6* 5.8*  ALBUMIN 2.8* 2.8*   No results for input(s): LIPASE, AMYLASE in the last 72 hours. Cardiac Enzymes No results for input(s): CKTOTAL, CKMB, CKMBINDEX, TROPONINI in the last 72 hours.  BNP: BNP (last 3 results) Recent Labs    08/03/20 1559  BNP 1,668.7*    ProBNP (last 3 results) No results for input(s): PROBNP in the last 8760 hours.   D-Dimer No results for input(s): DDIMER in the last 72 hours. Hemoglobin A1C Recent Labs    08/04/20 0845  HGBA1C 5.3   Fasting Lipid Panel No results for input(s): CHOL, HDL, LDLCALC, TRIG, CHOLHDL, LDLDIRECT in the last 72 hours. Thyroid Function Tests No results for input(s): TSH, T4TOTAL, T3FREE, THYROIDAB in the last 72 hours.  Invalid input(s): FREET3  Other results:   Imaging    ECHOCARDIOGRAM COMPLETE  Result Date: 08/04/2020  ECHOCARDIOGRAM REPORT   Patient Name:   [name] Date of Exam: 08/04/2020 Medical Rec #:  [id number]       Height:       73.0 in Accession #:    [id number]      Weight:       291.2 lb Date of Birth:  07/01/1955       BSA:          2.524 m Patient Age:    58 years        BP:           144/104 mmHg Patient Gender: M               HR:           101 bpm. Exam Location:  Inpatient Procedure: 2D Echo, Cardiac Doppler and Color Doppler Indications:    CHF-Acute Systolic H82.99  History:        Patient has prior history of Echocardiogram examinations, most                 recent 09/04/2019. CHF and Cardiomyopathy, Arrythmias:Atrial                 Flutter and Atrial Fibrillation; Risk Factors:Hypertension,                  Dyslipidemia and Diabetes. COVID-19 virus infection (Resolved),                 MORBID OBESITY.  Sonographer:    Alvino Chapel RCS Referring Phys: [place]  1. Left ventricular ejection fraction, by estimation, is 30 to 35%. The left ventricle has moderately decreased function. The left ventricle demonstrates global hypokinesis. The left ventricular internal cavity size was mildly dilated. There is severe asymmetric left ventricular hypertrophy of the basal-septal segment. Left ventricular diastolic parameters are indeterminate.  2. Right ventricular systolic function is moderately reduced. The right ventricular size is normal. There is normal pulmonary artery systolic pressure. The estimated right ventricular systolic pressure is 37.1 mmHg.  3. Left atrial size was severely dilated.  4. Right atrial size was moderately dilated.  5. The mitral valve is normal in structure. Trivial mitral valve regurgitation.  6. The aortic valve was not well visualized. Aortic valve regurgitation is not visualized. No aortic stenosis is present.  7. The inferior vena cava is dilated in size with >50% respiratory variability, suggesting right atrial pressure of 8 mmHg. FINDINGS  Left Ventricle: Left ventricular ejection fraction, by estimation, is 30 to 35%. The left ventricle has moderately decreased function. The left ventricle demonstrates global hypokinesis. Definity contrast agent was given IV to delineate the left ventricular endocardial borders. The left ventricular internal cavity size was mildly dilated. There is severe asymmetric left ventricular hypertrophy of the basal-septal segment. Left ventricular diastolic parameters are indeterminate. Right Ventricle: The right ventricular size is normal. Right vetricular wall thickness was not well visualized. Right ventricular systolic function is moderately reduced. There is normal pulmonary artery systolic pressure. The tricuspid regurgitant velocity  is 2.15 m/s, and with an assumed right atrial pressure of 8 mmHg, the estimated right ventricular systolic pressure is 69.6 mmHg. Left Atrium: Left atrial size was severely dilated. Right Atrium: Right atrial size was moderately dilated. Pericardium: There is no evidence of pericardial effusion. Mitral Valve: The mitral valve is normal in structure. Trivial mitral valve regurgitation. Tricuspid Valve: The tricuspid valve is normal in structure. Tricuspid valve regurgitation is trivial. Aortic Valve:  The aortic valve was not well visualized. Aortic valve regurgitation is not visualized. No aortic stenosis is present. Pulmonic Valve: The pulmonic valve was not well visualized. Pulmonic valve regurgitation is not visualized. Aorta: The aortic root is normal in size and structure. Venous: The inferior vena cava is dilated in size with greater than 50% respiratory variability, suggesting right atrial pressure of 8 mmHg. IAS/Shunts: The interatrial septum was not well visualized.  LEFT VENTRICLE PLAX 2D LVIDd:         5.90 cm LVIDs:         4.80 cm LV PW:         1.30 cm LV IVS:        1.55 cm LVOT diam:     2.30 cm LV SV:         31 LV SV Index:   12 LVOT Area:     4.15 cm  LV Volumes (MOD) LV vol d, MOD A2C: 113.0 ml LV vol d, MOD A4C: 125.0 ml LV vol s, MOD A2C: 74.0 ml LV vol s, MOD A4C: 80.7 ml LV SV MOD A2C:     39.0 ml LV SV MOD A4C:     125.0 ml LV SV MOD BP:      41.9 ml RIGHT VENTRICLE TAPSE (M-mode): 1.2 cm LEFT ATRIUM              Index       RIGHT ATRIUM           Index LA diam:        5.70 cm  2.26 cm/m  RA Area:     29.60 cm LA Vol (A2C):   166.0 ml 65.77 ml/m RA Volume:   104.00 ml 41.20 ml/m LA Vol (A4C):   201.0 ml 79.63 ml/m LA Biplane Vol: 183.0 ml 72.50 ml/m  AORTIC VALVE LVOT Vmax:   63.73 cm/s LVOT Vmean:  39.700 cm/s LVOT VTI:    0.074 m  AORTA Ao Root diam: 3.90 cm MV E velocity: 85.70 cm/s  TRICUSPID VALVE                            TR Peak grad:   18.5 mmHg                            TR  Vmax:        215.00 cm/s                             SHUNTS                            Systemic VTI:  0.07 m                            Systemic Diam: 2.30 cm Oswaldo Milian MD Electronically signed by Oswaldo Milian MD Signature Date/Time: 08/04/2020/5:46:09 PM    Final      Medications:     Scheduled Medications: . amiodarone  150 mg Intravenous Once  . Chlorhexidine Gluconate Cloth  6 each Topical Daily  . digoxin  0.125 mg Oral Daily  . insulin aspart  0-15 Units Subcutaneous TID WC  . insulin aspart  0-5 Units Subcutaneous QHS  . levothyroxine  50 mcg Oral Q0600  .  metoprolol succinate  12.5 mg Oral Daily  . potassium chloride  40 mEq Oral Once  . rosuvastatin  10 mg Oral Daily  . sodium chloride flush  10-40 mL Intracatheter Q12H  . sodium chloride flush  3 mL Intravenous Q12H  . sodium chloride flush  3 mL Intravenous Q12H  . spironolactone  12.5 mg Oral Daily  . Warfarin - Pharmacist Dosing Inpatient   Does not apply q1600    Infusions: . sodium chloride    . amiodarone     Followed by  . amiodarone    . heparin 1,800 Units/hr (08/05/20 0529)  . milrinone 0.25 mcg/kg/min (08/05/20 0304)    PRN Medications: sodium chloride, acetaminophen, sodium chloride flush, sodium chloride flush    Assessment/Plan   1. Acute on chronic systolic CHF: TEE 8/24 with EF <10%, severe RV dysfunction, LA appendage thrombus. Echoes in the past have had EF ranging 30-45%.  Possible tachycardia-mediated cardiomyopathy as he seems to become markedly more symptomatic when in atrial fibrillation though interestingly, HR initially was not elevated today (in 80s).  Alternatively, he could have another cause for cardiomyopathy and atrial fibrillation just causes him to decompensate.  Echo 2/19 with EF 25-30% on my review, moderately decreased RV systolic function. Cool extremities initially with co-ox 35%, up to 63% this morning with milrinone 0.25.  Creatinine 2.16 => 1.99.  Good  diuresis yesterday, CVP down to 6.    - Ultimately, would benefit from NSR but complicated by LA thrombus.   - Continue current milrinone 0.25, co-ox improved. Will start to wean possibly tomorrow (would like to see creatinine continue to trend down).  - Digoxin 0.125 daily.  - Had Lasix 80 mg IV this morning, will stop with CVP 6.   - Formal RHC +/- coronary angiography depending on creatinine on Monday.  Need to rule out coronary disease as underlying cause for cardiomyopathy (if not ischemic CMP, likely tachy-mediated CMP). Discussed risks/benefits with patient and he agrees to procedure.  2. Atrial fibrillation: Paroxysmal.  Ablation planned in 6/20 but had LA thrombus despite Xarelto use.  He converted back to NSR spontaneously.  Not on amiodarone or beta blockade due to reported history of bradycardia.  Had apparently been in NSR until last week, was seen this week in cardiology clinic with atrial fibrillation RVR around 120.  He has been compliant with Pradaxa, but had LA appendage thrombus again seen on TEE 2/18, no DCCV therefore.  Today, HR up in 110s-120s on milrinone gtt.  - Will transition to IV amiodarone for rate control.  - With clots despite Xarelto and Pradaxa use, I started heparin gtt with plan to start warfarin.  May need to aim for INR 2.5-3.  Start warfarin after cath tomorrow.  - Eventually would benefit from DCCV, but will need to clear thrombus.   - Will start amiodarone 200 mg bid to help keep in NSR once he is cardioverted in the future.  Long-term, would keep on amiodarone or aim for ablation.  3. AKI on CKD stage 3: Creatinine 1.6 seems to be near his baseline. 2.16 at admission, 1.99 today.  Suspect cardiorenal.  Follow closely.  4. OSA: Continue CPAP.  5. Type 2 diabetes: Eventual SGLT2 inhibitor, cover with SSI.  6. Elevated LFTs: Suspect congestive hepatopathy.    Ambulate in hall.    Length of Stay: 2  Loralie Champagne, MD  08/05/2020, 9:59 AM  Advanced Heart  Failure Team Pager [phone] (M-F; 7a - 4p)  Please contact  [facility] for night-coverage after hours (4p -7a ) and weekends on [email or web address]

## 2020-08-05 NOTE — Progress Notes (Signed)
   08/05/20 0820  Assess: MEWS Score  Temp 98 F (36.7 C)  BP (!) 142/108  Pulse Rate (!) 112  Resp 15  Level of Consciousness Alert  SpO2 98 %  O2 Device Room Air  Assess: MEWS Score  MEWS Temp 0  MEWS Systolic 0  MEWS Pulse 2  MEWS RR 0  MEWS LOC 0  MEWS Score 2  MEWS Score Color Yellow  Assess: if the MEWS score is Yellow or Red  Were vital signs taken at a resting state? Yes  Focused Assessment No change from prior assessment  Early Detection of Sepsis Score *See Row Information* Low  MEWS guidelines implemented *See Row Information* Yes  Treat  MEWS Interventions Administered scheduled meds/treatments  Pain Scale 0-10  Pain Score 0  Take Vital Signs  Increase Vital Sign Frequency  Yellow: Q 2hr X 2 then Q 4hr X 2, if remains yellow, continue Q 4hrs  Document  Progress note created (see row info) Yes   Patient with steadily increasing HR. Scheduled HR control medications. Will continue to monitor

## 2020-08-05 NOTE — Progress Notes (Signed)
Patient will self place CPAP when ready.  ?

## 2020-08-05 NOTE — Progress Notes (Signed)
ANTICOAGULATION CONSULT NOTE - Follow Up Consult  Pharmacy Consult for heparin Indication: LV thrombus  Labs: Recent Labs    08/04/20 0451 08/04/20 0842 08/05/20 0334  HGB  --  15.7 16.0  HCT  --  48.0 46.3  PLT  --  207 190  APTT 93*  --   --   LABPROT 27.0*  --  19.6*  INR 2.6*  --  1.7*  HEPARINUNFRC <0.10*  --  0.10*  CREATININE 2.16*  --  1.99*    Assessment: 64yo male subtherapeutic on heparin with initial dosing while transitioning from Pradaxa to Coumadin.  Goal of Therapy:  Heparin level 0.3-0.7 units/ml   Plan:  Will give small heparin bolus of 2000 units and increase heparin gtt by 3-4 units/kg/hr to 1800 units/hr and check level in 6 hours.    Wynona Neat, PharmD, BCPS  08/05/2020,4:58 AM

## 2020-08-06 ENCOUNTER — Encounter (HOSPITAL_COMMUNITY): Payer: Self-pay | Admitting: Cardiology

## 2020-08-06 ENCOUNTER — Inpatient Hospital Stay (HOSPITAL_COMMUNITY)
Admission: RE | Disposition: A | Discharge status: Home / Self Care | Payer: Medicare HMO | Source: Home / Self Care | Attending: Cardiology

## 2020-08-06 DIAGNOSIS — N183 Chronic kidney disease, stage 3 unspecified: Secondary | ICD-10-CM | POA: Diagnosis not present

## 2020-08-06 DIAGNOSIS — N179 Acute kidney failure, unspecified: Secondary | ICD-10-CM | POA: Diagnosis not present

## 2020-08-06 DIAGNOSIS — I509 Heart failure, unspecified: Secondary | ICD-10-CM

## 2020-08-06 DIAGNOSIS — I4819 Other persistent atrial fibrillation: Secondary | ICD-10-CM | POA: Diagnosis not present

## 2020-08-06 DIAGNOSIS — I5023 Acute on chronic systolic (congestive) heart failure: Secondary | ICD-10-CM | POA: Diagnosis not present

## 2020-08-06 HISTORY — PX: RIGHT HEART CATH: CATH118263

## 2020-08-06 LAB — HEPARIN LEVEL (UNFRACTIONATED): Heparin Unfractionated: 0.34 IU/mL (ref 0.30–0.70)

## 2020-08-06 LAB — POCT I-STAT EG7
Acid-Base Excess: 1 mmol/L (ref 0.0–2.0)
Acid-Base Excess: 3 mmol/L — ABNORMAL HIGH (ref 0.0–2.0)
Bicarbonate: 26.1 mmol/L (ref 20.0–28.0)
Bicarbonate: 28 mmol/L (ref 20.0–28.0)
Calcium, Ion: 1.16 mmol/L (ref 1.15–1.40)
Calcium, Ion: 1.16 mmol/L (ref 1.15–1.40)
HCT: 47 % (ref 39.0–52.0)
HCT: 48 % (ref 39.0–52.0)
Hemoglobin: 16 g/dL (ref 13.0–17.0)
Hemoglobin: 16.3 g/dL (ref 13.0–17.0)
O2 Saturation: 62 %
O2 Saturation: 65 %
Potassium: 3.9 mmol/L (ref 3.5–5.1)
Potassium: 3.9 mmol/L (ref 3.5–5.1)
Sodium: 139 mmol/L (ref 135–145)
Sodium: 140 mmol/L (ref 135–145)
TCO2: 27 mmol/L (ref 22–32)
TCO2: 29 mmol/L (ref 22–32)
pCO2, Ven: 40.9 mmHg — ABNORMAL LOW (ref 44.0–60.0)
pCO2, Ven: 41 mmHg — ABNORMAL LOW (ref 44.0–60.0)
pH, Ven: 7.412 (ref 7.250–7.430)
pH, Ven: 7.443 — ABNORMAL HIGH (ref 7.250–7.430)
pO2, Ven: 32 mmHg (ref 32.0–45.0)
pO2, Ven: 32 mmHg (ref 32.0–45.0)

## 2020-08-06 LAB — CBC
HCT: 47.7 % (ref 39.0–52.0)
Hemoglobin: 15.5 g/dL (ref 13.0–17.0)
MCH: 29.5 pg (ref 26.0–34.0)
MCHC: 32.5 g/dL (ref 30.0–36.0)
MCV: 90.9 fL (ref 80.0–100.0)
Platelets: 212 10*3/uL (ref 150–400)
RBC: 5.25 MIL/uL (ref 4.22–5.81)
RDW: 14.7 % (ref 11.5–15.5)
WBC: 7.7 10*3/uL (ref 4.0–10.5)
nRBC: 0 % (ref 0.0–0.2)

## 2020-08-06 LAB — GLUCOSE, CAPILLARY
Glucose-Capillary: 123 mg/dL — ABNORMAL HIGH (ref 70–99)
Glucose-Capillary: 177 mg/dL — ABNORMAL HIGH (ref 70–99)
Glucose-Capillary: 161 mg/dL — ABNORMAL HIGH (ref 70–99)
Glucose-Capillary: 155 mg/dL — ABNORMAL HIGH (ref 70–99)

## 2020-08-06 LAB — COMPREHENSIVE METABOLIC PANEL
ALT: 154 U/L — ABNORMAL HIGH (ref 0–44)
AST: 94 U/L — ABNORMAL HIGH (ref 15–41)
Albumin: 2.6 g/dL — ABNORMAL LOW (ref 3.5–5.0)
Alkaline Phosphatase: 63 U/L (ref 38–126)
Anion gap: 14 (ref 5–15)
BUN: 36 mg/dL — ABNORMAL HIGH (ref 8–23)
CO2: 25 mmol/L (ref 22–32)
Calcium: 8.1 mg/dL — ABNORMAL LOW (ref 8.9–10.3)
Chloride: 95 mmol/L — ABNORMAL LOW (ref 98–111)
Creatinine, Ser: 2.14 mg/dL — ABNORMAL HIGH (ref 0.61–1.24)
GFR, Estimated: 34 mL/min — ABNORMAL LOW (ref >60)
Glucose, Bld: 271 mg/dL — ABNORMAL HIGH (ref 70–99)
Potassium: 3.4 mmol/L — ABNORMAL LOW (ref 3.5–5.1)
Sodium: 134 mmol/L — ABNORMAL LOW (ref 135–145)
Total Bilirubin: 0.9 mg/dL (ref 0.3–1.2)
Total Protein: 5.6 g/dL — ABNORMAL LOW (ref 6.5–8.1)

## 2020-08-06 LAB — COOXEMETRY PANEL
Carboxyhemoglobin: 1.2 % (ref 0.5–1.5)
Methemoglobin: 0.5 % (ref 0.0–1.5)
O2 Saturation: 59.3 %
Total hemoglobin: 15.9 g/dL (ref 12.0–16.0)

## 2020-08-06 LAB — PROTIME-INR
INR: 1.4 — ABNORMAL HIGH (ref 0.8–1.2)
Prothrombin Time: 16.6 seconds — ABNORMAL HIGH (ref 11.4–15.2)

## 2020-08-06 SURGERY — RIGHT HEART CATH
Anesthesia: LOCAL

## 2020-08-06 MED ORDER — LIDOCAINE HCL (PF) 1 % IJ SOLN
INTRAMUSCULAR | Status: AC
  Filled 2020-08-06: qty 30

## 2020-08-06 MED ORDER — HEPARIN (PORCINE) IN NACL 1000-0.9 UT/500ML-% IV SOLN
INTRAVENOUS | Status: AC
  Filled 2020-08-06: qty 500

## 2020-08-06 MED ORDER — POTASSIUM CHLORIDE CRYS ER 20 MEQ PO TBCR
40.0000 meq | EXTENDED_RELEASE_TABLET | Freq: Once | ORAL | Status: AC
  Administered 2020-08-06: 40 meq via ORAL
  Filled 2020-08-06: qty 2

## 2020-08-06 MED ORDER — HEPARIN (PORCINE) IN NACL 1000-0.9 UT/500ML-% IV SOLN
INTRAVENOUS | Status: DC | PRN
  Administered 2020-08-06: 500 mL

## 2020-08-06 MED ORDER — FUROSEMIDE 10 MG/ML IJ SOLN
80.0000 mg | Freq: Two times a day (BID) | INTRAMUSCULAR | Status: DC
  Administered 2020-08-06 – 2020-08-09 (×7): 80 mg via INTRAVENOUS
  Filled 2020-08-06 (×7): qty 8

## 2020-08-06 MED ORDER — ISOSORBIDE MONONITRATE ER 30 MG PO TB24
30.0000 mg | ORAL_TABLET | Freq: Every day | ORAL | Status: DC
  Administered 2020-08-06 – 2020-08-07 (×2): 30 mg via ORAL
  Filled 2020-08-06 (×2): qty 1

## 2020-08-06 MED ORDER — WARFARIN SODIUM 7.5 MG PO TABS
7.5000 mg | ORAL_TABLET | Freq: Once | ORAL | Status: AC
  Administered 2020-08-06: 7.5 mg via ORAL
  Filled 2020-08-06: qty 1

## 2020-08-06 MED ORDER — LIDOCAINE HCL (PF) 1 % IJ SOLN
INTRAMUSCULAR | Status: DC | PRN
  Administered 2020-08-06: 1 mL

## 2020-08-06 MED ORDER — HYDRALAZINE HCL 25 MG PO TABS
25.0000 mg | ORAL_TABLET | Freq: Three times a day (TID) | ORAL | Status: DC
  Administered 2020-08-06 – 2020-08-07 (×3): 25 mg via ORAL
  Filled 2020-08-06 (×3): qty 1

## 2020-08-06 SURGICAL SUPPLY — 11 items
BAG SNAP BAND KOVER 36X36 (MISCELLANEOUS) ×1 IMPLANT
CATH BALLN WEDGE 5F 110CM (CATHETERS) ×1 IMPLANT
COVER DOME SNAP 22 D (MISCELLANEOUS) ×1 IMPLANT
GUIDEWIRE .025 260CM (WIRE) ×1 IMPLANT
PACK CARDIAC CATHETERIZATION (CUSTOM PROCEDURE TRAY) ×2 IMPLANT
PROTECTION STATION PRESSURIZED (MISCELLANEOUS) ×2
SHEATH GLIDE SLENDER 4/5FR (SHEATH) ×1 IMPLANT
STATION PROTECTION PRESSURIZED (MISCELLANEOUS) IMPLANT
TRANSDUCER W/STOPCOCK (MISCELLANEOUS) ×2 IMPLANT
TUBING ART PRESS 72  MALE/FEM (TUBING) ×2
TUBING ART PRESS 72 MALE/FEM (TUBING) IMPLANT

## 2020-08-06 NOTE — Care Management Important Message (Signed)
Important Message  Patient Details  Name: [name] MRN: [id number] Date of Birth: 1955/10/05   Medicare Important Message Given:  Yes     Orbie Pyo 08/06/2020, 4:02 PM

## 2020-08-06 NOTE — Progress Notes (Signed)
[id number] Offered to walk but pt declined at this time. Pt had CHF booklet in room but not aware. Showed pt booklet and discussed zones. Pt stated no one had told him to weigh daily. Encouraged daily weights and when to call MD. Hanley Seamen two low sodium diets and encouraged 2000 mg sodium restriction. Discussed signs/symptoms of when to call MD with CHF. Also gave pt OFF the BEAT booklet for atrial fib. Will continue to follow. Graylon Good RN BSN 08/06/2020 3:10 PM

## 2020-08-06 NOTE — Progress Notes (Signed)
ANTICOAGULATION CONSULT NOTE - Follow Up Consult  Pharmacy Consult for heparin Indication: LV thrombus  Heparin Dosing Weight: 110kg   Labs: Recent Labs    08/04/20 0451 08/04/20 0451 08/04/20 0842 08/05/20 0334 08/05/20 1058 08/05/20 2029 08/06/20 0500 08/06/20 0952 08/06/20 0955  HGB  --    < > 15.7 16.0  --   --  15.5 16.3 16.0  HCT  --    < > 48.0 46.3  --   --  47.7 48.0 47.0  PLT  --   --  207 190  --   --  212  --   --   APTT 93*  --   --   --   --   --   --   --   --   LABPROT 27.0*  --   --  19.6*  --   --  16.6*  --   --   INR 2.6*  --   --  1.7*  --   --  1.4*  --   --   HEPARINUNFRC <0.10*  --   --  0.10* <0.10* 0.40 0.34  --   --   CREATININE 2.16*  --   --  1.99*  --   --  2.14*  --   --    < > = values in this interval not displayed.    Assessment: 76 yoM with hx AFib and LA thrombus. Pt previously on Xarelto, transitioned to Pradaxa with thrombus, now to transition to warfarin with heparin bridge given ongoing LA thrombus on TEE 2/18.  Pt now s/p RHC, pharmacy to begin warfarin. INR today is 1.4, heparin level therapeutic at 0.34, CBC wnl.  Goal of Therapy:  Heparin level 0.3-0.7 units/ml  INR 2.5-3   Plan:  -Increase heparin to 2150 units/h to prevent subtherapeutic drop -Warfarin 7.5mg  PO x1 tonight -Daily INR, heparin level, CBC   Arrie Senate, PharmD, [place], [facility] Clinical Pharmacist [phone] Please check AMION for all [place] numbers 08/06/2020

## 2020-08-06 NOTE — Progress Notes (Signed)
Patient ID: [name], male   DOB: 1956-01-29, 65 y.o.   MRN: [id number]     Advanced Heart Failure Rounding Note  PCP-Cardiologist: Buford Dresser, MD   Subjective:    Co-ox 35% and cool extremities initial admission, increased to 59% on milrinone 0.25 this morning.  Extremities now warm.  Creatinine 2.16 => 1.99 => 2.14, baseline around 1.6.  RHC done today, see below.    INR 1.4 this morning.  He is on heparin gtt with LA appendage thrombus.   BP elevated but HR increased 100s this morning in atrial fibrillation on amiodarone gtt.  RHC Procedural Findings (on milrinone 0.25): Hemodynamics (mmHg) RA mean 12 RV 50/12 PA 55/34 PCWP unable to obtain.  Due to patient's size, the catheter was too short to reach the wedge position on the left or right sides.  Oxygen saturations: PA 64% AO 99% Cardiac Output (Fick) 4.5  Cardiac Index (Fick) 1.8   Objective:   Weight Range: 127.7 kg Body mass index is 37.14 kg/m.   Vital Signs:   Temp:  [97.6 F (36.4 C)-98.4 F (36.9 C)] 98.4 F (36.9 C) (02/21 0808) Pulse Rate:  [66-119] 102 (02/21 0957) Resp:  [9-20] 13 (02/21 0957) BP: (125-176)/(88-121) 176/102 (02/21 0957) SpO2:  [94 %-99 %] 97 % (02/21 0957) Weight:  [127.7 kg] 127.7 kg (02/21 0509) Last BM Date: 08/04/20  Weight change: Filed Weights   08/04/20 0454 08/05/20 0551 08/06/20 0509  Weight: 132.1 kg 128 kg 127.7 kg    Intake/Output:   Intake/Output Summary (Last 24 hours) at 08/06/2020 1005 Last data filed at 08/06/2020 0811 Gross per 24 hour  Intake 1937.18 ml  Output 3050 ml  Net -1112.82 ml      Physical Exam    General: NAD Neck: JVP 10-12 cm, no thyromegaly or thyroid nodule.  Lungs: Clear to auscultation bilaterally with normal respiratory effort. CV: Nondisplaced PMI.  Heart mildly tachy, irregular S1/S2, no S3/S4, no murmur.  Trace ankle edema.   Abdomen: Soft, nontender, no hepatosplenomegaly, no distention.  Skin: Intact without  lesions or rashes.  Neurologic: Alert and oriented x 3.  Psych: Normal affect. Extremities: No clubbing or cyanosis.  HEENT: Normal.    Telemetry   Atrial fibrillation 100s, rare PVCs (personally reviewed)   Labs    CBC Recent Labs    08/05/20 0334 08/06/20 0500 08/06/20 0952 08/06/20 0955  WBC 7.8 7.7  --   --   HGB 16.0 15.5 16.3 16.0  HCT 46.3 47.7 48.0 47.0  MCV 89.4 90.9  --   --   PLT 190 212  --   --    Basic Metabolic Panel Recent Labs    08/04/20 0451 08/05/20 0334 08/06/20 0500 08/06/20 0952 08/06/20 0955  NA 133* 133* 134* 139 140  K 4.0 3.4* 3.4* 3.9 3.9  CL 99 97* 95*  --   --   CO2 21* 26 25  --   --   GLUCOSE 120* 206* 271*  --   --   BUN 44* 40* 36*  --   --   CREATININE 2.16* 1.99* 2.14*  --   --   CALCIUM 8.3* 8.4* 8.1*  --   --   MG 1.9  --   --   --   --    Liver Function Tests Recent Labs    08/05/20 0334 08/06/20 0500  AST 131* 94*  ALT 163* 154*  ALKPHOS 69 63  BILITOT 1.6* 0.9  PROT 5.8*  5.6*  ALBUMIN 2.8* 2.6*   No results for input(s): LIPASE, AMYLASE in the last 72 hours. Cardiac Enzymes No results for input(s): CKTOTAL, CKMB, CKMBINDEX, TROPONINI in the last 72 hours.  BNP: BNP (last 3 results) Recent Labs    08/03/20 1559  BNP 1,668.7*    ProBNP (last 3 results) No results for input(s): PROBNP in the last 8760 hours.   D-Dimer No results for input(s): DDIMER in the last 72 hours. Hemoglobin A1C Recent Labs    08/04/20 0845  HGBA1C 5.3   Fasting Lipid Panel No results for input(s): CHOL, HDL, LDLCALC, TRIG, CHOLHDL, LDLDIRECT in the last 72 hours. Thyroid Function Tests No results for input(s): TSH, T4TOTAL, T3FREE, THYROIDAB in the last 72 hours.  Invalid input(s): FREET3  Other results:   Imaging    CARDIAC CATHETERIZATION  Result Date: 08/06/2020 1. Filling pressures remain elevated. 2. Cardiac output is low even on milrinone 0.25.     Medications:     Scheduled Medications: . [MAR  Hold] Chlorhexidine Gluconate Cloth  6 each Topical Daily  . [MAR Hold] digoxin  0.125 mg Oral Daily  . furosemide  80 mg Intravenous BID  . hydrALAZINE  25 mg Oral Q8H  . [MAR Hold] insulin aspart  0-15 Units Subcutaneous TID WC  . [MAR Hold] insulin aspart  0-5 Units Subcutaneous QHS  . isosorbide mononitrate  30 mg Oral Daily  . [MAR Hold] levothyroxine  50 mcg Oral Q0600  . [MAR Hold] metoprolol succinate  12.5 mg Oral Daily  . [MAR Hold] rosuvastatin  10 mg Oral Daily  . [MAR Hold] sodium chloride flush  10-40 mL Intracatheter Q12H  . [MAR Hold] sodium chloride flush  3 mL Intravenous Q12H  . [MAR Hold] sodium chloride flush  3 mL Intravenous Q12H  . [MAR Hold] spironolactone  12.5 mg Oral Daily  . [MAR Hold] Warfarin - Pharmacist Dosing Inpatient   Does not apply q1600    Infusions: . [MAR Hold] sodium chloride    . sodium chloride    . sodium chloride 10 mL/hr at 08/06/20 0525  . amiodarone 30 mg/hr (08/06/20 0525)  . heparin 2,100 Units/hr (08/06/20 0525)  . milrinone 0.25 mcg/kg/min (08/06/20 0525)    PRN Medications: [MAR Hold] sodium chloride, sodium chloride, [MAR Hold] acetaminophen, [MAR Hold] sodium chloride flush, [MAR Hold] sodium chloride flush, sodium chloride flush    Assessment/Plan   1. Acute on chronic systolic CHF: TEE 5/42 with EF <10%, severe RV dysfunction, LA appendage thrombus. Echoes in the past have had EF ranging 30-45%.  Possible tachycardia-mediated cardiomyopathy as he seems to become markedly more symptomatic when in atrial fibrillation though interestingly, HR initially was not elevated today (in 80s).  Alternatively, he could have another cause for cardiomyopathy and atrial fibrillation just causes him to decompensate.  Echo 2/19 with EF 25-30% on my review, moderately decreased RV systolic function. Cool extremities initially with co-ox 35%, up to 59% this morning with milrinone 0.25.  Creatinine 2.16 => 1.99 => 2.14.  RHC today with filling  pressures still elevated, CI low at 1.8.  - Ultimately, would benefit from NSR but complicated by LA thrombus.   - Continue current milrinone 0.25, will not increase as I will plan to relook at him via TEE for DCCV now that he has been on heparin gtt for a few days. - Digoxin 0.125 daily.  - Start hydralazine 25 mg tid and Imdur 30 for afterload reduction.  - Can continue spironolactone 12.5 daily.  -  Will restart Lasix 80 mg IV bid with elevated filling pressures.    - Eventually need to rule out coronary disease as underlying cause for cardiomyopathy (if not ischemic CMP, likely tachy-mediated CMP). Coronary angiography not done today with creatinine higher at 2.14.  Will need in future, timing to be decided.  2. Atrial fibrillation: Persistent.  Ablation planned in 6/20 but had LA thrombus despite Xarelto use.  He converted back to NSR spontaneously.  Not on amiodarone or beta blockade due to reported history of bradycardia.  Had apparently been in NSR until the week PTA, was seen in cardiology clinic with atrial fibrillation RVR around 120.  He has been compliant with Pradaxa, but had LA appendage thrombus again seen on TEE 2/18, no DCCV therefore.  Today, HR 100s on amiodarone gtt.  - Continue amiodarone gtt for rate control.  - With clots despite Xarelto and Pradaxa use, I started heparin gtt with plan to start warfarin.  May need to aim for INR 2.5-3.  Start on warfarin.   - Eventually would benefit from DCCV, but will need to clear thrombus => will look again by TEE on Wednesday after he has been on heparin gtt for a few days.  If thrombus is still present, will need a few more weeks on warfarin.  - Will started amiodarone to help keep in NSR once he is cardioverted in the future.  Long-term, would keep on amiodarone or aim for ablation.  3. AKI on CKD stage 3: Creatinine 1.6 seems to be near his baseline. 2.16 at admission, 2.14 today.  Suspect cardiorenal.  Follow closely.  4. OSA: Continue  CPAP.  5. Type 2 diabetes: Eventual SGLT2 inhibitor, cover with SSI.  6. Elevated LFTs: Suspect congestive hepatopathy.    Ambulate in hall.    Length of Stay: 3  Loralie Champagne, MD  08/06/2020, 10:05 AM  Advanced Heart Failure Team Pager [phone] (M-F; 7a - 4p)  Please contact [facility] for night-coverage after hours (4p -7a ) and weekends on [email or web address]

## 2020-08-06 NOTE — Interval H&P Note (Signed)
History and Physical Interval Note:  08/06/2020 9:36 AM  [name]  has presented today for surgery, with the diagnosis of heart failure.  The various methods of treatment have been discussed with the patient and family. After consideration of risks, benefits and other options for treatment, the patient has consented to  Procedure(s): RIGHT HEART CATH (N/A) as a surgical intervention.  The patient's history has been reviewed, patient examined, no change in status, stable for surgery.  I have reviewed the patient's chart and labs.  Questions were answered to the patient's satisfaction.     Evelette Hollern [employer]

## 2020-08-06 NOTE — Progress Notes (Signed)
Pharmacist Heart Failure Core Measure Documentation  Assessment: [name] has an EF documented as  on 30-35% by ECHO.  Rationale: Heart failure patients with left ventricular systolic dysfunction (LVSD) and an EF < 40% should be prescribed an angiotensin converting enzyme inhibitor (ACEI) or angiotensin receptor blocker (ARB) at discharge unless a contraindication is documented in the medical record.  This patient is not currently on an ACEI or ARB for HF.  This note is being placed in the record in order to provide documentation that a contraindication to the use of these agents is present for this encounter.  ACE Inhibitor or Angiotensin Receptor Blocker is contraindicated (specify all that apply)  []   ACEI allergy AND ARB allergy []   Angioedema []   Moderate or severe aortic stenosis []   Hyperkalemia []   Hypotension []   Renal artery stenosis [x]   Worsening renal function, preexisting renal disease or dysfunction   Minh Pham 08/06/2020 1:20 PM

## 2020-08-07 DIAGNOSIS — I5023 Acute on chronic systolic (congestive) heart failure: Secondary | ICD-10-CM | POA: Diagnosis not present

## 2020-08-07 LAB — COMPREHENSIVE METABOLIC PANEL
ALT: 119 U/L — ABNORMAL HIGH (ref 0–44)
AST: 52 U/L — ABNORMAL HIGH (ref 15–41)
Albumin: 2.9 g/dL — ABNORMAL LOW (ref 3.5–5.0)
Alkaline Phosphatase: 63 U/L (ref 38–126)
Anion gap: 11 (ref 5–15)
BUN: 33 mg/dL — ABNORMAL HIGH (ref 8–23)
CO2: 27 mmol/L (ref 22–32)
Calcium: 8.5 mg/dL — ABNORMAL LOW (ref 8.9–10.3)
Chloride: 97 mmol/L — ABNORMAL LOW (ref 98–111)
Creatinine, Ser: 1.83 mg/dL — ABNORMAL HIGH (ref 0.61–1.24)
GFR, Estimated: 40 mL/min — ABNORMAL LOW (ref >60)
Glucose, Bld: 219 mg/dL — ABNORMAL HIGH (ref 70–99)
Potassium: 3.8 mmol/L (ref 3.5–5.1)
Sodium: 135 mmol/L (ref 135–145)
Total Bilirubin: 1 mg/dL (ref 0.3–1.2)
Total Protein: 5.9 g/dL — ABNORMAL LOW (ref 6.5–8.1)

## 2020-08-07 LAB — CBC
HCT: 46.7 % (ref 39.0–52.0)
Hemoglobin: 15.9 g/dL (ref 13.0–17.0)
MCH: 30.5 pg (ref 26.0–34.0)
MCHC: 34 g/dL (ref 30.0–36.0)
MCV: 89.6 fL (ref 80.0–100.0)
Platelets: 191 10*3/uL (ref 150–400)
RBC: 5.21 MIL/uL (ref 4.22–5.81)
RDW: 14.8 % (ref 11.5–15.5)
WBC: 7 10*3/uL (ref 4.0–10.5)
nRBC: 0 % (ref 0.0–0.2)

## 2020-08-07 LAB — PROTIME-INR
INR: 1.3 — ABNORMAL HIGH (ref 0.8–1.2)
Prothrombin Time: 15.5 seconds — ABNORMAL HIGH (ref 11.4–15.2)

## 2020-08-07 LAB — COOXEMETRY PANEL
Carboxyhemoglobin: 1.1 % (ref 0.5–1.5)
Carboxyhemoglobin: 1.2 % (ref 0.5–1.5)
Methemoglobin: 0.6 % (ref 0.0–1.5)
Methemoglobin: 0.8 % (ref 0.0–1.5)
O2 Saturation: 49.9 %
O2 Saturation: 50.6 %
Total hemoglobin: 16.1 g/dL — ABNORMAL HIGH (ref 12.0–16.0)
Total hemoglobin: 15.9 g/dL (ref 12.0–16.0)

## 2020-08-07 LAB — GLUCOSE, CAPILLARY
Glucose-Capillary: 127 mg/dL — ABNORMAL HIGH (ref 70–99)
Glucose-Capillary: 127 mg/dL — ABNORMAL HIGH (ref 70–99)
Glucose-Capillary: 126 mg/dL — ABNORMAL HIGH (ref 70–99)
Glucose-Capillary: 149 mg/dL — ABNORMAL HIGH (ref 70–99)

## 2020-08-07 LAB — HEPARIN LEVEL (UNFRACTIONATED): Heparin Unfractionated: 0.49 IU/mL (ref 0.30–0.70)

## 2020-08-07 MED ORDER — WARFARIN SODIUM 7.5 MG PO TABS
7.5000 mg | ORAL_TABLET | Freq: Once | ORAL | Status: AC
  Administered 2020-08-07: 7.5 mg via ORAL
  Filled 2020-08-07: qty 1

## 2020-08-07 MED ORDER — DAPAGLIFLOZIN PROPANEDIOL 10 MG PO TABS
10.0000 mg | ORAL_TABLET | Freq: Every day | ORAL | Status: DC
  Administered 2020-08-07 – 2020-08-08 (×2): 10 mg via ORAL
  Filled 2020-08-07 (×2): qty 1

## 2020-08-07 MED ORDER — SPIRONOLACTONE 25 MG PO TABS
25.0000 mg | ORAL_TABLET | Freq: Every day | ORAL | Status: DC
  Administered 2020-08-07 – 2020-08-10 (×4): 25 mg via ORAL
  Filled 2020-08-07 (×4): qty 1

## 2020-08-07 MED ORDER — SODIUM CHLORIDE 0.9 % IV SOLN
INTRAVENOUS | Status: DC
Start: 2020-08-08 — End: 2020-08-08

## 2020-08-07 MED ORDER — HYDRALAZINE HCL 25 MG PO TABS
37.5000 mg | ORAL_TABLET | Freq: Three times a day (TID) | ORAL | Status: DC
  Administered 2020-08-07 – 2020-08-08 (×3): 37.5 mg via ORAL
  Filled 2020-08-07 (×3): qty 2

## 2020-08-07 MED FILL — Lidocaine HCl Local Preservative Free (PF) Inj 1%: INTRAMUSCULAR | Qty: 30 | Status: AC

## 2020-08-07 NOTE — Progress Notes (Addendum)
Patient ID: [name], male   DOB: [birth year], 65 y.o.   MRN: [id number]     Advanced Heart Failure Rounding Note  PCP-Cardiologist: Buford Dresser, MD   Subjective:   Remains on milrinone 0.25 mcg + amio drip.  Yesterday diuresed with IV lasix.  Brisk diuresis noted. Weight down another 4 pounds.   Denies SOB. Denies chest pain   RHC Procedural Findings (on milrinone 0.25): Hemodynamics (mmHg) RA mean 12 RV 50/12 PA 55/34 PCWP unable to obtain.  Due to patient's size, the catheter was too short to reach the wedge position on the left or right sides.  Oxygen saturations: PA 64% AO 99% Cardiac Output (Fick) 4.5  Cardiac Index (Fick) 1.8   Objective:   Weight Range: 125.3 kg Body mass index is 36.44 kg/m.   Vital Signs:   Temp:  [97.8 F (36.6 C)-98.7 F (37.1 C)] 98.2 F (36.8 C) (02/22 0531) Pulse Rate:  [80-119] 90 (02/22 0531) Resp:  [9-20] 16 (02/22 0531) BP: (143-176)/(89-121) 143/118 (02/22 0531) SpO2:  [93 %-99 %] 93 % (02/22 0531) Weight:  [125.3 kg] 125.3 kg (02/22 0531) Last BM Date: 08/06/20  Weight change: Filed Weights   08/05/20 0551 08/06/20 0509 08/07/20 0531  Weight: 128 kg 127.7 kg 125.3 kg    Intake/Output:   Intake/Output Summary (Last 24 hours) at 08/07/2020 0746 Last data filed at 08/07/2020 0334 Gross per 24 hour  Intake 1550.8 ml  Output 4900 ml  Net -3349.2 ml      Physical Exam   CVP 11 General:  Well appearing. No resp difficulty HEENT: normal Neck: supple. JVP 10-11. Carotids 2+ bilat; no bruits. No lymphadenopathy or thryomegaly appreciated. Cor: PMI nondisplaced. Irregular rate & rhythm. No rubs, gallops or murmurs. Lungs: clear Abdomen: soft, nontender, nondistended. No hepatosplenomegaly. No bruits or masses. Good bowel sounds. Extremities: no cyanosis, clubbing, rash, edema. RUE PICC Neuro: alert & orientedx3, cranial nerves grossly intact. moves all 4 extremities w/o difficulty. Affect pleasant      Telemetry   A Fib 100s with occasional PVCs   Labs    CBC Recent Labs    08/06/20 0500 08/06/20 0952 08/06/20 0955 08/07/20 0545  WBC 7.7  --   --  7.0  HGB 15.5   < > 16.0 15.9  HCT 47.7   < > 47.0 46.7  MCV 90.9  --   --  89.6  PLT 212  --   --  191   < > = values in this interval not displayed.   Basic Metabolic Panel Recent Labs    08/06/20 0500 08/06/20 0952 08/06/20 0955 08/07/20 0545  NA 134*   < > 140 135  K 3.4*   < > 3.9 3.8  CL 95*  --   --  97*  CO2 25  --   --  27  GLUCOSE 271*  --   --  219*  BUN 36*  --   --  33*  CREATININE 2.14*  --   --  1.83*  CALCIUM 8.1*  --   --  8.5*   < > = values in this interval not displayed.   Liver Function Tests Recent Labs    08/06/20 0500 08/07/20 0545  AST 94* 52*  ALT 154* 119*  ALKPHOS 63 63  BILITOT 0.9 1.0  PROT 5.6* 5.9*  ALBUMIN 2.6* 2.9*   No results for input(s): LIPASE, AMYLASE in the last 72 hours. Cardiac Enzymes No results for input(s): CKTOTAL, CKMB, CKMBINDEX,  TROPONINI in the last 72 hours.  BNP: BNP (last 3 results) Recent Labs    08/03/20 1559  BNP 1,668.7*    ProBNP (last 3 results) No results for input(s): PROBNP in the last 8760 hours.   D-Dimer No results for input(s): DDIMER in the last 72 hours. Hemoglobin A1C Recent Labs    08/04/20 0845  HGBA1C 5.3   Fasting Lipid Panel No results for input(s): CHOL, HDL, LDLCALC, TRIG, CHOLHDL, LDLDIRECT in the last 72 hours. Thyroid Function Tests No results for input(s): TSH, T4TOTAL, T3FREE, THYROIDAB in the last 72 hours.  Invalid input(s): FREET3  Other results:   Imaging    CARDIAC CATHETERIZATION  Result Date: 08/06/2020 1. Filling pressures remain elevated. 2. Cardiac output is low even on milrinone 0.25.     Medications:     Scheduled Medications: . Chlorhexidine Gluconate Cloth  6 each Topical Daily  . digoxin  0.125 mg Oral Daily  . furosemide  80 mg Intravenous BID  . hydrALAZINE  25 mg Oral  Q8H  . insulin aspart  0-15 Units Subcutaneous TID WC  . insulin aspart  0-5 Units Subcutaneous QHS  . isosorbide mononitrate  30 mg Oral Daily  . levothyroxine  50 mcg Oral Q0600  . metoprolol succinate  12.5 mg Oral Daily  . rosuvastatin  10 mg Oral Daily  . sodium chloride flush  10-40 mL Intracatheter Q12H  . sodium chloride flush  3 mL Intravenous Q12H  . sodium chloride flush  3 mL Intravenous Q12H  . spironolactone  12.5 mg Oral Daily  . Warfarin - Pharmacist Dosing Inpatient   Does not apply q1600    Infusions: . sodium chloride    . amiodarone 30 mg/hr (08/07/20 0334)  . heparin 2,150 Units/hr (08/07/20 0545)  . milrinone 0.25 mcg/kg/min (08/07/20 0334)    PRN Medications: sodium chloride, acetaminophen, sodium chloride flush, sodium chloride flush    Assessment/Plan   1. Acute on chronic systolic CHF: TEE 6/72 with EF <10%, severe RV dysfunction, LA appendage thrombus. Echoes in the past have had EF ranging 30-45%.  Possible tachycardia-mediated cardiomyopathy as he seems to become markedly more symptomatic when in atrial fibrillation though interestingly, HR initially was not elevated today (in 80s).  Alternatively, he could have another cause for cardiomyopathy and atrial fibrillation just causes him to decompensate.  Echo 2/19 with EF 25-30% on my review, moderately decreased RV systolic function. Cool extremities initially with co-ox 35%, up to 59% this morning with milrinone 0.25.  Creatinine 2.16 => 1.99 => 2.14.  [place] 2/21 with filling pressures still elevated, CI low at 1.8.  - Ultimately, would benefit from NSR but complicated by LA thrombus.   - Continue current milrinone 0.25, will not increase as  plan to relook at him via TEE for DCCV now that he has been on heparin gtt for a few days. - CO-OX 50% . Repeat CO-OX  - Digoxin 0.125 daily.  - Increase hydralazine 37.5 mg three time a day. Continue Imdur 30 for afterload reduction.  - Can continue spironolactone  12.5 daily.  - CVP 10-11.  Continue IV lasix.   Brisk diuresis noted.   - Eventually need to rule out coronary disease as underlying cause for cardiomyopathy (if not ischemic CMP, likely tachy-mediated CMP). Coronary angiography not done with creatinine higher at 2.14.  Will need in future, timing to be decided.  2. Atrial fibrillation: Persistent.  Ablation planned in 6/20 but had LA thrombus despite Xarelto use.  He  converted back to NSR spontaneously.  Not on amiodarone or beta blockade due to reported history of bradycardia.  Had apparently been in NSR until the week PTA, was seen in cardiology clinic with atrial fibrillation RVR around 120.  He has been compliant with Pradaxa, but had LA appendage thrombus again seen on TEE 2/18, no DCCV therefore.  .  - Continue amiodarone gtt for rate control.  - With clots despite Xarelto and Pradaxa use, he was started heparin gtt with plan to start warfarin.  May need to aim for INR 2.5-3.  Started on warfarin. INR 1.3   - Eventually would benefit from DCCV, but will need to clear thrombus => will look again by TEE on Wednesday after he has been on heparin gtt for a few days.  If thrombus is still present, will need a few more weeks on warfarin.  - Will started amiodarone to help keep in NSR once he is cardioverted in the future.  Long-term, would keep on amiodarone or aim for ablation.  3. AKI on CKD stage 3: Creatinine 1.6 seems to be near his baseline. 2.16 at admission, 2.14>1.8 today.  Suspect cardiorenal.  Follow closely.  4. OSA: Continue CPAP.  5. Type 2 diabetes: Eventual SGLT2 inhibitor, cover with SSI.  6. Elevated LFTs: Suspect congestive hepatopathy.  Trending down with inotropes.  Increase hyralazine as above.  Consult cardiac rehab.  Plan for TEE/DC-CV  Length of Stay: 4  Amy Clegg, NP  08/07/2020, 7:46 AM  Advanced Heart Failure Team Pager [phone] (M-F; [place])  Please contact [facility] for night-coverage after hours (4p -7a )  and weekends on [email or web address]  Patient seen with NP, agree with the above note. Good diuresis yesterday, weight coming down.  Today, CVP 11 with early am co-ox low at 50%.    No complaints, no dyspnea, wants to walk more.   General: NAD Neck: JVP 9-10 cm, no thyromegaly or thyroid nodule.  Lungs: Clear to auscultation bilaterally with normal respiratory effort. CV: Nondisplaced PMI.  Heart irregular S1/S2, no S3/S4, no murmur.  Trace ankle edema.  Abdomen: Soft, nontender, no hepatosplenomegaly, no distention.  Skin: Intact without lesions or rashes.  Neurologic: Alert and oriented x 3.  Psych: Normal affect. Extremities: No clubbing or cyanosis.  HEENT: Normal.   Continue milrinone 0.25, repeat co-ox.  Continue Lasix 80 mg IV bid today. Creatinine lower today at 1.83.   BP elevated.  Will increase hydralazine to 37.5 mg tid and continue Imdur.  Increase spironolactone to 25 mg daily.  Add [place].    He continues on heparin gtt + warfarin, INR 1.3.  Will aim for INR 2.5-3.  Will plan re-look TEE tomorrow to see if LA thrombus is still present.  Will lower milrinone prior to 0.125.  DCCV if able.   Loralie Champagne 08/07/2020 8:50 AM

## 2020-08-07 NOTE — Progress Notes (Signed)
[phone] Pt politely declined walk at this time as going to eat lunch first and then walk with his RN.  Pt also being followed by Mobility specialist. Reinforced importance of daily weights and watching sodium. Pt has scales at home.  Stated will look over ed when home. Will let pt walk with staff and Mobility team and see as time permits. Graylon Good RN BSN 08/07/2020 11:18 AM

## 2020-08-07 NOTE — Progress Notes (Signed)
ANTICOAGULATION CONSULT NOTE - Follow Up Consult  Pharmacy Consult for heparin + warfarin Indication: LV thrombus  Heparin Dosing Weight: 110kg   Labs: Recent Labs    08/05/20 0334 08/05/20 1058 08/05/20 2029 08/06/20 0500 08/06/20 0952 08/06/20 0955 08/07/20 0545  HGB 16.0  --   --  15.5 16.3 16.0 15.9  HCT 46.3  --   --  47.7 48.0 47.0 46.7  PLT 190  --   --  212  --   --  191  LABPROT 19.6*  --   --  16.6*  --   --  15.5*  INR 1.7*  --   --  1.4*  --   --  1.3*  HEPARINUNFRC 0.10*   < > 0.40 0.34  --   --  0.49  CREATININE 1.99*  --   --  2.14*  --   --  1.83*   < > = values in this interval not displayed.    Assessment: 28 yoM with hx AFib and LA thrombus. Pt previously on Xarelto, transitioned to Pradaxa with thrombus, now to transition to warfarin with heparin bridge given ongoing LA thrombus on TEE 2/18.  Pt now s/p RHC, pharmacy to begin warfarin 2/21. INR today is 1.3 as pradaxa effect washes out. Heparin drip 2150 uts/hr  heparin level therapeutic at 0.49, CBC wnl.  Goal of Therapy:  Heparin level 0.3-0.7 units/ml  INR 2.5-3   Plan:  Continue  heparin drip 2150 units/h  -Warfarin 7.5mg  PO x1 tonight - repeat  -Daily INR, heparin level, CBC   Bonnita Nasuti Pharm.D. CPP, BCPS Clinical Pharmacist [phone] 08/07/2020 1:18 PM    Please check AMION for all [place] numbers 08/07/2020

## 2020-08-07 NOTE — TOC Transition Note (Addendum)
Transition of Care [facility]) - CM/SW Discharge Note   Patient Details  Name: [name] MRN: [id number] Date of Birth: Apr 05, 1956  Transition of Care [facility]) CM/SW Contact:  Zenon Mayo, RN Phone Number: 08/07/2020, 9:54 AM   Clinical Narrative:    NCM spoke with patient, he lives alone, he is independent, he still drives.  He has a scale and weighs himself daily.  He would like for he scripts to go to his pharmacy [facility] prior to discharge. He does not want any HH services. He has no needs.   Final next level of care: Home/Self Care Barriers to Discharge: Continued Medical Work up   Patient Goals and CMS Choice Patient states their goals for this hospitalization and ongoing recovery are:: get better   Choice offered to / list presented to : NA  Discharge Placement                       Discharge Plan and Services                  DME Agency: NA       HH Arranged: NA          Social Determinants of Health (SDOH) Interventions     Readmission Risk Interventions No flowsheet data found.

## 2020-08-07 NOTE — H&P (View-Only) (Signed)
Patient ID: [name], male   DOB: [birth year], 65 y.o.   MRN: [id number]     Advanced Heart Failure Rounding Note  PCP-Cardiologist: Buford Dresser, MD   Subjective:   Remains on milrinone 0.25 mcg + amio drip.  Yesterday diuresed with IV lasix.  Brisk diuresis noted. Weight down another 4 pounds.   Denies SOB. Denies chest pain   RHC Procedural Findings (on milrinone 0.25): Hemodynamics (mmHg) RA mean 12 RV 50/12 PA 55/34 PCWP unable to obtain.  Due to patient's size, the catheter was too short to reach the wedge position on the left or right sides.  Oxygen saturations: PA 64% AO 99% Cardiac Output (Fick) 4.5  Cardiac Index (Fick) 1.8   Objective:   Weight Range: 125.3 kg Body mass index is 36.44 kg/m.   Vital Signs:   Temp:  [97.8 F (36.6 C)-98.7 F (37.1 C)] 98.2 F (36.8 C) (02/22 0531) Pulse Rate:  [80-119] 90 (02/22 0531) Resp:  [9-20] 16 (02/22 0531) BP: (143-176)/(89-121) 143/118 (02/22 0531) SpO2:  [93 %-99 %] 93 % (02/22 0531) Weight:  [125.3 kg] 125.3 kg (02/22 0531) Last BM Date: 08/06/20  Weight change: Filed Weights   08/05/20 0551 08/06/20 0509 08/07/20 0531  Weight: 128 kg 127.7 kg 125.3 kg    Intake/Output:   Intake/Output Summary (Last 24 hours) at 08/07/2020 0746 Last data filed at 08/07/2020 0334 Gross per 24 hour  Intake 1550.8 ml  Output 4900 ml  Net -3349.2 ml      Physical Exam   CVP 11 General:  Well appearing. No resp difficulty HEENT: normal Neck: supple. JVP 10-11. Carotids 2+ bilat; no bruits. No lymphadenopathy or thryomegaly appreciated. Cor: PMI nondisplaced. Irregular rate & rhythm. No rubs, gallops or murmurs. Lungs: clear Abdomen: soft, nontender, nondistended. No hepatosplenomegaly. No bruits or masses. Good bowel sounds. Extremities: no cyanosis, clubbing, rash, edema. RUE PICC Neuro: alert & orientedx3, cranial nerves grossly intact. moves all 4 extremities w/o difficulty. Affect pleasant      Telemetry   A Fib 100s with occasional PVCs   Labs    CBC Recent Labs    08/06/20 0500 08/06/20 0952 08/06/20 0955 08/07/20 0545  WBC 7.7  --   --  7.0  HGB 15.5   < > 16.0 15.9  HCT 47.7   < > 47.0 46.7  MCV 90.9  --   --  89.6  PLT 212  --   --  191   < > = values in this interval not displayed.   Basic Metabolic Panel Recent Labs    08/06/20 0500 08/06/20 0952 08/06/20 0955 08/07/20 0545  NA 134*   < > 140 135  K 3.4*   < > 3.9 3.8  CL 95*  --   --  97*  CO2 25  --   --  27  GLUCOSE 271*  --   --  219*  BUN 36*  --   --  33*  CREATININE 2.14*  --   --  1.83*  CALCIUM 8.1*  --   --  8.5*   < > = values in this interval not displayed.   Liver Function Tests Recent Labs    08/06/20 0500 08/07/20 0545  AST 94* 52*  ALT 154* 119*  ALKPHOS 63 63  BILITOT 0.9 1.0  PROT 5.6* 5.9*  ALBUMIN 2.6* 2.9*   No results for input(s): LIPASE, AMYLASE in the last 72 hours. Cardiac Enzymes No results for input(s): CKTOTAL, CKMB, CKMBINDEX,  TROPONINI in the last 72 hours.  BNP: BNP (last 3 results) Recent Labs    08/03/20 1559  BNP 1,668.7*    ProBNP (last 3 results) No results for input(s): PROBNP in the last 8760 hours.   D-Dimer No results for input(s): DDIMER in the last 72 hours. Hemoglobin A1C Recent Labs    08/04/20 0845  HGBA1C 5.3   Fasting Lipid Panel No results for input(s): CHOL, HDL, LDLCALC, TRIG, CHOLHDL, LDLDIRECT in the last 72 hours. Thyroid Function Tests No results for input(s): TSH, T4TOTAL, T3FREE, THYROIDAB in the last 72 hours.  Invalid input(s): FREET3  Other results:   Imaging    CARDIAC CATHETERIZATION  Result Date: 08/06/2020 1. Filling pressures remain elevated. 2. Cardiac output is low even on milrinone 0.25.     Medications:     Scheduled Medications: . Chlorhexidine Gluconate Cloth  6 each Topical Daily  . digoxin  0.125 mg Oral Daily  . furosemide  80 mg Intravenous BID  . hydrALAZINE  25 mg Oral  Q8H  . insulin aspart  0-15 Units Subcutaneous TID WC  . insulin aspart  0-5 Units Subcutaneous QHS  . isosorbide mononitrate  30 mg Oral Daily  . levothyroxine  50 mcg Oral Q0600  . metoprolol succinate  12.5 mg Oral Daily  . rosuvastatin  10 mg Oral Daily  . sodium chloride flush  10-40 mL Intracatheter Q12H  . sodium chloride flush  3 mL Intravenous Q12H  . sodium chloride flush  3 mL Intravenous Q12H  . spironolactone  12.5 mg Oral Daily  . Warfarin - Pharmacist Dosing Inpatient   Does not apply q1600    Infusions: . sodium chloride    . amiodarone 30 mg/hr (08/07/20 0334)  . heparin 2,150 Units/hr (08/07/20 0545)  . milrinone 0.25 mcg/kg/min (08/07/20 0334)    PRN Medications: sodium chloride, acetaminophen, sodium chloride flush, sodium chloride flush    Assessment/Plan   1. Acute on chronic systolic CHF: TEE 2/42 with EF <10%, severe RV dysfunction, LA appendage thrombus. Echoes in the past have had EF ranging 30-45%.  Possible tachycardia-mediated cardiomyopathy as he seems to become markedly more symptomatic when in atrial fibrillation though interestingly, HR initially was not elevated today (in 80s).  Alternatively, he could have another cause for cardiomyopathy and atrial fibrillation just causes him to decompensate.  Echo 2/19 with EF 25-30% on my review, moderately decreased RV systolic function. Cool extremities initially with co-ox 35%, up to 59% this morning with milrinone 0.25.  Creatinine 2.16 => 1.99 => 2.14.  [place] 2/21 with filling pressures still elevated, CI low at 1.8.  - Ultimately, would benefit from NSR but complicated by LA thrombus.   - Continue current milrinone 0.25, will not increase as  plan to relook at him via TEE for DCCV now that he has been on heparin gtt for a few days. - CO-OX 50% . Repeat CO-OX  - Digoxin 0.125 daily.  - Increase hydralazine 37.5 mg three time a day. Continue Imdur 30 for afterload reduction.  - Can continue spironolactone  12.5 daily.  - CVP 10-11.  Continue IV lasix.   Brisk diuresis noted.   - Eventually need to rule out coronary disease as underlying cause for cardiomyopathy (if not ischemic CMP, likely tachy-mediated CMP). Coronary angiography not done with creatinine higher at 2.14.  Will need in future, timing to be decided.  2. Atrial fibrillation: Persistent.  Ablation planned in 6/20 but had LA thrombus despite Xarelto use.  He  converted back to NSR spontaneously.  Not on amiodarone or beta blockade due to reported history of bradycardia.  Had apparently been in NSR until the week PTA, was seen in cardiology clinic with atrial fibrillation RVR around 120.  He has been compliant with Pradaxa, but had LA appendage thrombus again seen on TEE 2/18, no DCCV therefore.  .  - Continue amiodarone gtt for rate control.  - With clots despite Xarelto and Pradaxa use, he was started heparin gtt with plan to start warfarin.  May need to aim for INR 2.5-3.  Started on warfarin. INR 1.3   - Eventually would benefit from DCCV, but will need to clear thrombus => will look again by TEE on Wednesday after he has been on heparin gtt for a few days.  If thrombus is still present, will need a few more weeks on warfarin.  - Will started amiodarone to help keep in NSR once he is cardioverted in the future.  Long-term, would keep on amiodarone or aim for ablation.  3. AKI on CKD stage 3: Creatinine 1.6 seems to be near his baseline. 2.16 at admission, 2.14>1.8 today.  Suspect cardiorenal.  Follow closely.  4. OSA: Continue CPAP.  5. Type 2 diabetes: Eventual SGLT2 inhibitor, cover with SSI.  6. Elevated LFTs: Suspect congestive hepatopathy.  Trending down with inotropes.  Increase hyralazine as above.  Consult cardiac rehab.  Plan for TEE/DC-CV  Length of Stay: 4  Amy Clegg, NP  08/07/2020, 7:46 AM  Advanced Heart Failure Team Pager [phone] (M-F; [place])  Please contact [facility] for night-coverage after hours (4p -7a )  and weekends on [email or web address]  Patient seen with NP, agree with the above note. Good diuresis yesterday, weight coming down.  Today, CVP 11 with early am co-ox low at 50%.    No complaints, no dyspnea, wants to walk more.   General: NAD Neck: JVP 9-10 cm, no thyromegaly or thyroid nodule.  Lungs: Clear to auscultation bilaterally with normal respiratory effort. CV: Nondisplaced PMI.  Heart irregular S1/S2, no S3/S4, no murmur.  Trace ankle edema.  Abdomen: Soft, nontender, no hepatosplenomegaly, no distention.  Skin: Intact without lesions or rashes.  Neurologic: Alert and oriented x 3.  Psych: Normal affect. Extremities: No clubbing or cyanosis.  HEENT: Normal.   Continue milrinone 0.25, repeat co-ox.  Continue Lasix 80 mg IV bid today. Creatinine lower today at 1.83.   BP elevated.  Will increase hydralazine to 37.5 mg tid and continue Imdur.  Increase spironolactone to 25 mg daily.  Add [place].    He continues on heparin gtt + warfarin, INR 1.3.  Will aim for INR 2.5-3.  Will plan re-look TEE tomorrow to see if LA thrombus is still present.  Will lower milrinone prior to 0.125.  DCCV if able.   Loralie Champagne 08/07/2020 8:50 AM

## 2020-08-08 ENCOUNTER — Inpatient Hospital Stay (HOSPITAL_COMMUNITY): Payer: Medicare HMO | Admitting: Certified Registered Nurse Anesthetist

## 2020-08-08 ENCOUNTER — Encounter (HOSPITAL_COMMUNITY): Payer: Self-pay | Admitting: Cardiology

## 2020-08-08 ENCOUNTER — Encounter (HOSPITAL_COMMUNITY)
Admission: RE | Disposition: A | Discharge status: Home / Self Care | Payer: Self-pay | Source: Home / Self Care | Attending: Cardiology

## 2020-08-08 ENCOUNTER — Inpatient Hospital Stay (HOSPITAL_COMMUNITY): Payer: Medicare HMO

## 2020-08-08 DIAGNOSIS — I5023 Acute on chronic systolic (congestive) heart failure: Secondary | ICD-10-CM | POA: Diagnosis not present

## 2020-08-08 DIAGNOSIS — I340 Nonrheumatic mitral (valve) insufficiency: Secondary | ICD-10-CM

## 2020-08-08 DIAGNOSIS — I4891 Unspecified atrial fibrillation: Secondary | ICD-10-CM

## 2020-08-08 DIAGNOSIS — I480 Paroxysmal atrial fibrillation: Secondary | ICD-10-CM

## 2020-08-08 HISTORY — PX: TEE WITHOUT CARDIOVERSION: SHX5443

## 2020-08-08 HISTORY — PX: CARDIOVERSION: SHX1299

## 2020-08-08 LAB — COMPREHENSIVE METABOLIC PANEL
ALT: 93 U/L — ABNORMAL HIGH (ref 0–44)
AST: 41 U/L (ref 15–41)
Albumin: 2.9 g/dL — ABNORMAL LOW (ref 3.5–5.0)
Alkaline Phosphatase: 64 U/L (ref 38–126)
Anion gap: 12 (ref 5–15)
BUN: 29 mg/dL — ABNORMAL HIGH (ref 8–23)
CO2: 28 mmol/L (ref 22–32)
Calcium: 8.6 mg/dL — ABNORMAL LOW (ref 8.9–10.3)
Chloride: 97 mmol/L — ABNORMAL LOW (ref 98–111)
Creatinine, Ser: 1.69 mg/dL — ABNORMAL HIGH (ref 0.61–1.24)
GFR, Estimated: 44 mL/min — ABNORMAL LOW (ref >60)
Glucose, Bld: 122 mg/dL — ABNORMAL HIGH (ref 70–99)
Potassium: 3.6 mmol/L (ref 3.5–5.1)
Sodium: 137 mmol/L (ref 135–145)
Total Bilirubin: 0.9 mg/dL (ref 0.3–1.2)
Total Protein: 6.3 g/dL — ABNORMAL LOW (ref 6.5–8.1)

## 2020-08-08 LAB — CBC
HCT: 47.8 % (ref 39.0–52.0)
Hemoglobin: 16.2 g/dL (ref 13.0–17.0)
MCH: 30.1 pg (ref 26.0–34.0)
MCHC: 33.9 g/dL (ref 30.0–36.0)
MCV: 88.7 fL (ref 80.0–100.0)
Platelets: 202 10*3/uL (ref 150–400)
RBC: 5.39 MIL/uL (ref 4.22–5.81)
RDW: 14.6 % (ref 11.5–15.5)
WBC: 7 10*3/uL (ref 4.0–10.5)
nRBC: 0 % (ref 0.0–0.2)

## 2020-08-08 LAB — GLUCOSE, CAPILLARY
Glucose-Capillary: 135 mg/dL — ABNORMAL HIGH (ref 70–99)
Glucose-Capillary: 175 mg/dL — ABNORMAL HIGH (ref 70–99)
Glucose-Capillary: 117 mg/dL — ABNORMAL HIGH (ref 70–99)
Glucose-Capillary: 151 mg/dL — ABNORMAL HIGH (ref 70–99)

## 2020-08-08 LAB — COOXEMETRY PANEL
Carboxyhemoglobin: 1.2 % (ref 0.5–1.5)
Methemoglobin: 0.8 % (ref 0.0–1.5)
O2 Saturation: 49.4 %
Total hemoglobin: 16.6 g/dL — ABNORMAL HIGH (ref 12.0–16.0)

## 2020-08-08 LAB — PROTIME-INR
INR: 1.3 — ABNORMAL HIGH (ref 0.8–1.2)
Prothrombin Time: 15.7 seconds — ABNORMAL HIGH (ref 11.4–15.2)

## 2020-08-08 LAB — DIGOXIN LEVEL: Digoxin Level: 0.6 ng/mL — ABNORMAL LOW (ref 0.8–2.0)

## 2020-08-08 LAB — HEPARIN LEVEL (UNFRACTIONATED): Heparin Unfractionated: 0.52 IU/mL (ref 0.30–0.70)

## 2020-08-08 SURGERY — ECHOCARDIOGRAM, TRANSESOPHAGEAL
Anesthesia: General

## 2020-08-08 MED ORDER — AMIODARONE IV BOLUS ONLY 150 MG/100ML
INTRAVENOUS | Status: DC | PRN
  Administered 2020-08-08: 100 mg via INTRAVENOUS

## 2020-08-08 MED ORDER — ISOSORBIDE MONONITRATE ER 60 MG PO TB24
60.0000 mg | ORAL_TABLET | Freq: Every day | ORAL | Status: DC
  Administered 2020-08-08 – 2020-08-10 (×3): 60 mg via ORAL
  Filled 2020-08-08 (×3): qty 1

## 2020-08-08 MED ORDER — SODIUM CHLORIDE 0.9 % IV SOLN
INTRAVENOUS | Status: AC | PRN
  Administered 2020-08-08: 1000 mL via INTRAVENOUS

## 2020-08-08 MED ORDER — ENOXAPARIN SODIUM 120 MG/0.8ML ~~LOC~~ SOLN
120.0000 mg | Freq: Two times a day (BID) | SUBCUTANEOUS | Status: DC
  Administered 2020-08-08 – 2020-08-10 (×4): 120 mg via SUBCUTANEOUS
  Filled 2020-08-08 (×5): qty 0.8

## 2020-08-08 MED ORDER — BUTAMBEN-TETRACAINE-BENZOCAINE 2-2-14 % EX AERO
INHALATION_SPRAY | CUTANEOUS | Status: DC | PRN
  Administered 2020-08-08: 2 via TOPICAL

## 2020-08-08 MED ORDER — WARFARIN SODIUM 7.5 MG PO TABS
7.5000 mg | ORAL_TABLET | Freq: Once | ORAL | Status: AC
  Administered 2020-08-08: 7.5 mg via ORAL
  Filled 2020-08-08: qty 1

## 2020-08-08 MED ORDER — LACTATED RINGERS IV SOLN: INTRAVENOUS | Status: DC | PRN

## 2020-08-08 MED ORDER — PROPOFOL 10 MG/ML IV BOLUS
INTRAVENOUS | Status: DC | PRN
  Administered 2020-08-08: 20 mg via INTRAVENOUS

## 2020-08-08 MED ORDER — ENOXAPARIN SODIUM 120 MG/0.8ML ~~LOC~~ SOLN
120.0000 mg | Freq: Two times a day (BID) | SUBCUTANEOUS | Status: DC
  Filled 2020-08-08: qty 0.8

## 2020-08-08 MED ORDER — POTASSIUM CHLORIDE CRYS ER 20 MEQ PO TBCR
40.0000 meq | EXTENDED_RELEASE_TABLET | Freq: Two times a day (BID) | ORAL | Status: AC
  Administered 2020-08-08 (×2): 40 meq via ORAL
  Filled 2020-08-08 (×2): qty 2

## 2020-08-08 MED ORDER — EMPAGLIFLOZIN 10 MG PO TABS
10.0000 mg | ORAL_TABLET | Freq: Every day | ORAL | Status: DC
  Administered 2020-08-09 – 2020-08-10 (×2): 10 mg via ORAL
  Filled 2020-08-08 (×2): qty 1

## 2020-08-08 MED ORDER — PROPOFOL 500 MG/50ML IV EMUL
INTRAVENOUS | Status: DC | PRN
  Administered 2020-08-08: 125 ug/kg/min via INTRAVENOUS

## 2020-08-08 MED ORDER — HYDRALAZINE HCL 50 MG PO TABS
50.0000 mg | ORAL_TABLET | Freq: Three times a day (TID) | ORAL | Status: DC
  Administered 2020-08-08 – 2020-08-09 (×3): 50 mg via ORAL
  Filled 2020-08-08 (×3): qty 1

## 2020-08-08 NOTE — Anesthesia Postprocedure Evaluation (Signed)
Anesthesia Post Note  Patient: [name]  Procedure(s) Performed: TRANSESOPHAGEAL ECHOCARDIOGRAM (TEE) (N/A ) CARDIOVERSION (N/A )     Patient location during evaluation: PACU Anesthesia Type: General Level of consciousness: awake and alert Pain management: pain level controlled Vital Signs Assessment: post-procedure vital signs reviewed and stable Respiratory status: spontaneous breathing, nonlabored ventilation, respiratory function stable and patient connected to nasal cannula oxygen Cardiovascular status: blood pressure returned to baseline and stable Postop Assessment: no apparent nausea or vomiting Anesthetic complications: no   No complications documented.  Last Vitals:  Vitals:   08/08/20 0939 08/08/20 1222  BP: (!) 133/96 (!) 141/92  Pulse: 87 66  Resp:  19  Temp:  36.5 C  SpO2:  97%    Last Pain:  Vitals:   08/08/20 1222  TempSrc: Oral  PainSc:                  Effie Berkshire

## 2020-08-08 NOTE — Progress Notes (Signed)
  Echocardiogram Echocardiogram Transesophageal has been performed.  [name] M 08/08/2020, 8:49 AM

## 2020-08-08 NOTE — Transfer of Care (Signed)
Immediate Anesthesia Transfer of Care Note  Patient: [name]  Procedure(s) Performed: TRANSESOPHAGEAL ECHOCARDIOGRAM (TEE) (N/A ) CARDIOVERSION (N/A )  Patient Location: Endoscopy Unit  Anesthesia Type:MAC and General  Level of Consciousness: awake, alert  and oriented  Airway & Oxygen Therapy: Patient Spontanous Breathing and Patient connected to nasal cannula oxygen  Post-op Assessment: Report given to RN and Post -op Vital signs reviewed and stable  Post vital signs: Reviewed and stable  Last Vitals:  Vitals Value Taken Time  BP 109/56 08/08/20 0846  Temp    Pulse 75 08/08/20 0849  Resp 16 08/08/20 0849  SpO2 99 % 08/08/20 0849  Vitals shown include unvalidated device data.  Last Pain:  Vitals:   08/08/20 0846  TempSrc:   PainSc: 0-No pain         Complications: No complications documented.

## 2020-08-08 NOTE — Progress Notes (Signed)
Patient ID: [name], male   DOB: 1956/05/22, 65 y.o.   MRN: [id number]     Advanced Heart Failure Rounding Note  PCP-Cardiologist: Buford Dresser, MD   Subjective:    Remains on milrinone 0.25 mcg + amio drip.  Yesterday diuresed with IV lasix.  Brisk diuresis noted again. Weight down another 6 pounds. CVP 9-10 this morning.  Co-ox still low at 50% but he feels good.   TEE-guided DCCV to NSR today.  TEE showed EF 25%, mild-moderate RV dysfunction, mild-moderate MR, no LA appendage thrombus.   RHC Procedural Findings (on milrinone 0.25): Hemodynamics (mmHg) RA mean 12 RV 50/12 PA 55/34 PCWP unable to obtain.  Due to patient's size, the catheter was too short to reach the wedge position on the left or right sides.  Oxygen saturations: PA 64% AO 99% Cardiac Output (Fick) 4.5  Cardiac Index (Fick) 1.8   Objective:   Weight Range: 122.5 kg Body mass index is 35.62 kg/m.   Vital Signs:   Temp:  [97.6 F (36.4 C)-98 F (36.7 C)] 97.6 F (36.4 C) (02/23 0720) Pulse Rate:  [98-111] 98 (02/23 0720) Resp:  [15-20] 15 (02/23 0720) BP: (135-154)/(92-108) 135/101 (02/23 0720) SpO2:  [94 %-98 %] 98 % (02/23 0720) Weight:  [122.5 kg] 122.5 kg (02/23 0527) Last BM Date: 08/06/20  Weight change: Filed Weights   08/06/20 0509 08/07/20 0531 08/08/20 0527  Weight: 127.7 kg 125.3 kg 122.5 kg    Intake/Output:   Intake/Output Summary (Last 24 hours) at 08/08/2020 0843 Last data filed at 08/08/2020 0454 Gross per 24 hour  Intake 1841.66 ml  Output 4175 ml  Net -2333.34 ml      Physical Exam   CVP 9-10 General: NAD Neck: JVP 10 cm, no thyromegaly or thyroid nodule.  Lungs: Clear to auscultation bilaterally with normal respiratory effort. CV: Nondisplaced PMI.  Heart irregular S1/S2, no S3/S4, no murmur.  Trace ankle edema. Abdomen: Soft, nontender, no hepatosplenomegaly, no distention.  Skin: Intact without lesions or rashes.  Neurologic: Alert and oriented x 3.   Psych: Normal affect. Extremities: No clubbing or cyanosis.  HEENT: Normal.    Telemetry    A Fib 100s with occasional PVCs => NSR 80s-90s (personally reviewed)   Labs    CBC Recent Labs    08/07/20 0545 08/08/20 0536  WBC 7.0 7.0  HGB 15.9 16.2  HCT 46.7 47.8  MCV 89.6 88.7  PLT 191 643   Basic Metabolic Panel Recent Labs    08/07/20 0545 08/08/20 0536  NA 135 137  K 3.8 3.6  CL 97* 97*  CO2 27 28  GLUCOSE 219* 122*  BUN 33* 29*  CREATININE 1.83* 1.69*  CALCIUM 8.5* 8.6*   Liver Function Tests Recent Labs    08/07/20 0545 08/08/20 0536  AST 52* 41  ALT 119* 93*  ALKPHOS 63 64  BILITOT 1.0 0.9  PROT 5.9* 6.3*  ALBUMIN 2.9* 2.9*   No results for input(s): LIPASE, AMYLASE in the last 72 hours. Cardiac Enzymes No results for input(s): CKTOTAL, CKMB, CKMBINDEX, TROPONINI in the last 72 hours.  BNP: BNP (last 3 results) Recent Labs    08/03/20 1559  BNP 1,668.7*    ProBNP (last 3 results) No results for input(s): PROBNP in the last 8760 hours.   D-Dimer No results for input(s): DDIMER in the last 72 hours. Hemoglobin A1C No results for input(s): HGBA1C in the last 72 hours. Fasting Lipid Panel No results for input(s): CHOL, HDL, LDLCALC,  TRIG, CHOLHDL, LDLDIRECT in the last 72 hours. Thyroid Function Tests No results for input(s): TSH, T4TOTAL, T3FREE, THYROIDAB in the last 72 hours.  Invalid input(s): FREET3  Other results:   Imaging    No results found.   Medications:     Scheduled Medications: . [MAR Hold] Chlorhexidine Gluconate Cloth  6 each Topical Daily  . [MAR Hold] dapagliflozin propanediol  10 mg Oral Daily  . [MAR Hold] digoxin  0.125 mg Oral Daily  . [MAR Hold] furosemide  80 mg Intravenous BID  . hydrALAZINE  50 mg Oral Q8H  . [MAR Hold] insulin aspart  0-15 Units Subcutaneous TID WC  . [MAR Hold] insulin aspart  0-5 Units Subcutaneous QHS  . isosorbide mononitrate  60 mg Oral Daily  . [MAR Hold] levothyroxine   50 mcg Oral Q0600  . [MAR Hold] metoprolol succinate  12.5 mg Oral Daily  . potassium chloride  40 mEq Oral BID  . [MAR Hold] rosuvastatin  10 mg Oral Daily  . [MAR Hold] sodium chloride flush  10-40 mL Intracatheter Q12H  . [MAR Hold] sodium chloride flush  3 mL Intravenous Q12H  . [MAR Hold] sodium chloride flush  3 mL Intravenous Q12H  . [MAR Hold] spironolactone  25 mg Oral Daily  . [MAR Hold] Warfarin - Pharmacist Dosing Inpatient   Does not apply q1600    Infusions: . [MAR Hold] sodium chloride    . sodium chloride 20 mL/hr at 08/08/20 0648  . amiodarone 30 mg/hr (08/08/20 0812)  . heparin 2,150 Units/hr (08/08/20 5784)  . milrinone 0.125 mcg/kg/min (08/08/20 0830)    PRN Medications: [MAR Hold] sodium chloride, [MAR Hold] acetaminophen, [MAR Hold] sodium chloride flush, [MAR Hold] sodium chloride flush    Assessment/Plan   1. Acute on chronic systolic CHF: TEE 6/96 with EF <10%, severe RV dysfunction, LA appendage thrombus. Echoes in the past have had EF ranging 30-45%.  Possible tachycardia-mediated cardiomyopathy as he seems to become markedly more symptomatic when in atrial fibrillation though interestingly, HR initially was not elevated (in 80s).  Alternatively, he could have another cause for cardiomyopathy and atrial fibrillation just causes him to decompensate.  Echo 2/19 with EF 25-30% on my review, moderately decreased RV systolic function. Cool extremities initially with co-ox 35%, still low at 50% this morning with milrinone 0.25.  Creatinine 2.16 => 1.99 => 2.14 => 1.69.  [place] 2/21 with filling pressures still elevated, CI low at 1.8. Good diuresis, CVP down to 9-10 today.  Now s/p DCCV and back in NSR, hopefully this will help his cardiac output and if this is a tachy-mediated CMP, improve EF over time.   - I will leave milrinone at 0.125 mcg/kg/min today to try to decrease the drive to go back into atrial fibrillation.  - Lasix 80 mg IV bid x 1 more day.  - Continue  digoxin, dapagliflozin, and spironolactone.  - Increase hydralazine to 50 mg three time a day, increase Imdur to 60 mg daily.  - Eventually need to rule out coronary disease as underlying cause for cardiomyopathy (if not ischemic CMP, likely tachy-mediated CMP). Coronary angiography not done earlier this week with creatinine higher at 2.14.  Will need in future, timing to be decided => will be after a month or so of therapeutic anticoagulation post-DCCV.   2. Atrial fibrillation: Persistent.  Ablation planned in 6/20 but had LA thrombus despite Xarelto use.  He converted back to NSR spontaneously.  Not on amiodarone or beta blockade due to  reported history of bradycardia.  Had apparently been in NSR until the week PTA, was seen in cardiology clinic with atrial fibrillation RVR around 120.  He has been compliant with Pradaxa, but had LA appendage thrombus again seen on TEE 2/18, no DCCV therefore.   TEE 2/22 showed no LA appendage thrombus on IV heparin gtt + warfarin, so DCCV done.  Now back in NSR.  - Continue amiodarone gtt while on milrinone gtt, then to po.  - With clots despite Xarelto and Pradaxa use, he was started heparin gtt + warfarin.  Will aim for INR 2.5-3. INR 1.3 today.    - Eventually would benefit from DCCV, but will need to clear thrombus => will look again by TEE on Wednesday after he has been on heparin gtt for a few days.  If thrombus is still present, will need a few more weeks on warfarin.  - Long-term, would keep on amiodarone and aim for AF ablation.  3. AKI on CKD stage 3: Creatinine 1.69 now, seems to be near his baseline. 2.16 at admission.  Suspect cardiorenal.  Follow closely.  4. OSA: Continue CPAP.  5. Type 2 diabetes: Started on Farxiga.  6. Elevated LFTs: Suspect congestive hepatopathy.  Trending down with inotropes.   Loralie Champagne 08/08/2020 8:43 AM

## 2020-08-08 NOTE — Interval H&P Note (Signed)
History and Physical Interval Note:  08/08/2020 8:24 AM  [name]  has presented today for surgery, with the diagnosis of afib.  The various methods of treatment have been discussed with the patient and family. After consideration of risks, benefits and other options for treatment, the patient has consented to  Procedure(s): TRANSESOPHAGEAL ECHOCARDIOGRAM (TEE) (N/A) CARDIOVERSION (N/A) as a surgical intervention.  The patient's history has been reviewed, patient examined, no change in status, stable for surgery.  I have reviewed the patient's chart and labs.  Questions were answered to the patient's satisfaction.     [name] [employer]

## 2020-08-08 NOTE — Procedures (Signed)
Electrical Cardioversion Procedure Note TRAVARIS KOSH [id number] 06-20-1955  Procedure: Electrical Cardioversion Indications:  Atrial Fibrillation  Procedure Details Consent: Risks of procedure as well as the alternatives and risks of each were explained to the (patient/caregiver).  Consent for procedure obtained. Time Out: Verified patient identification, verified procedure, site/side was marked, verified correct patient position, special equipment/implants available, medications/allergies/relevent history reviewed, required imaging and test results available.  Performed  Patient placed on cardiac monitor, pulse oximetry, supplemental oxygen as necessary.  Sedation given: Per anesthesiology Pacer pads placed anterior and posterior chest.  Cardioverted 2 time(s).  Cardioverted at [place].  Evaluation Findings: Post procedure EKG shows: NSR Complications: None Patient did tolerate procedure well.   Loralie Champagne 08/08/2020, 8:39 AM

## 2020-08-08 NOTE — CV Procedure (Signed)
Procedure: TEE  Sedation: Per anesthesiology  Indication: Atrial fibrillation, h/o LA appendage thrombus.   Findings: Please see echo section for full report.  The left ventricle was mildly dilated with mild LV hypertrophy.  EF 25%, diffuse hypokinesis.  The RV was mildly dilated with mild-moderate systolic dysfunction.  Mild right atrial enlargement.  Moderate left atrial enlargement, no LA appendage thrombus noted though there was smoke present.  Mild tricuspid regurgitation, peak RV-RA gradient 40 mmHg.  Mild-moderate MR.  Trileaflet aortic valve with no stenosis or regurgitation.  Normal caliber thoracic aorta with minimal plaque.   May proceed with DCCV.   Loralie Champagne 08/08/2020 8:42 AM

## 2020-08-08 NOTE — Plan of Care (Signed)
°  Problem: Education: °Goal: Ability to demonstrate management of disease process will improve °Outcome: Progressing °Goal: Ability to verbalize understanding of medication therapies will improve °Outcome: Progressing °Goal: Individualized Educational Video(s) °Outcome: Progressing °  °

## 2020-08-08 NOTE — Anesthesia Preprocedure Evaluation (Signed)
Anesthesia Evaluation  Patient identified by MRN, date of birth, ID band Patient awake    Reviewed: Allergy & Precautions, NPO status , Patient's Chart, lab work & pertinent test results  Airway Mallampati: I  TM Distance: >3 FB Neck ROM: Full    Dental  (+) Poor Dentition, Partial Upper   Pulmonary asthma , sleep apnea ,    breath sounds clear to auscultation       Cardiovascular hypertension, Pt. on medications and Pt. on home beta blockers +CHF  + dysrhythmias Atrial Fibrillation  Rhythm:Irregular Rate:Abnormal     Neuro/Psych negative neurological ROS  negative psych ROS   GI/Hepatic negative GI ROS, Neg liver ROS,   Endo/Other  diabetes, Type 2, Insulin DependentHypothyroidism   Renal/GU Renal InsufficiencyRenal disease     Musculoskeletal negative musculoskeletal ROS (+)   Abdominal Normal abdominal exam  (+)   Peds  Hematology negative hematology ROS (+)   Anesthesia Other Findings - HLD  Reproductive/Obstetrics                             Anesthesia Physical  Anesthesia Plan  ASA: III  Anesthesia Plan: General   Post-op Pain Management:    Induction: Intravenous  PONV Risk Score and Plan: 0 and Propofol infusion  Airway Management Planned:   Additional Equipment: None  Intra-op Plan:   Post-operative Plan:   Informed Consent: I have reviewed the patients History and Physical, chart, labs and discussed the procedure including the risks, benefits and alternatives for the proposed anesthesia with the patient or authorized representative who has indicated his/her understanding and acceptance.       Plan Discussed with: CRNA  Anesthesia Plan Comments:         Anesthesia Quick Evaluation

## 2020-08-08 NOTE — Anesthesia Procedure Notes (Signed)
Procedure Name: MAC Date/Time: 08/08/2020 8:17 AM Performed by: Kathryne Hitch, CRNA Pre-anesthesia Checklist: Patient identified, Emergency Drugs available, Suction available and Patient being monitored Patient Re-evaluated:Patient Re-evaluated prior to induction Oxygen Delivery Method: Nasal cannula Preoxygenation: Pre-oxygenation with 100% oxygen Induction Type: IV induction Placement Confirmation: positive ETCO2 Dental Injury: Teeth and Oropharynx as per pre-operative assessment

## 2020-08-08 NOTE — Plan of Care (Signed)
  Problem: Clinical Measurements: Goal: Ability to maintain clinical measurements within normal limits will improve Outcome: Progressing   Problem: Clinical Measurements: Goal: Will remain free from infection Outcome: Progressing   

## 2020-08-08 NOTE — Progress Notes (Signed)
[id number] Pt stated he just walked to bathroom and has walked a couple of times by himself and feels comfortable doing that.  Stated will be going home in a day or two. No questions re ed. Will sign off as pt walking on his own and prefers doing so.  NSR 82 at this time.  Graylon Good RN BSN 08/08/2020 2:39 PM

## 2020-08-08 NOTE — Progress Notes (Signed)
ANTICOAGULATION CONSULT NOTE - Follow UpConsult Pharmacy Consult for heparin + warfarin Indication: LV Thrombus  No Known Allergies  Patient Measurements: Height: 6\' 1"  (185.4 cm) Weight: 122.5 kg (270 lb) (scale c) IBW/kg (Calculated) : 79.9 Heparin Dosing Weight: 110kg   Vital Signs: Temp: 97.7 F (36.5 C) (02/23 1222) Temp Source: Oral (02/23 1222) BP: 141/92 (02/23 1222) Pulse Rate: 66 (02/23 1222)  Labs: Recent Labs    08/06/20 0500 08/06/20 0952 08/06/20 0955 08/07/20 0545 08/08/20 0536  HGB 15.5   < > 16.0 15.9 16.2  HCT 47.7   < > 47.0 46.7 47.8  PLT 212  --   --  191 202  LABPROT 16.6*  --   --  15.5* 15.7*  INR 1.4*  --   --  1.3* 1.3*  HEPARINUNFRC 0.34  --   --  0.49 0.52  CREATININE 2.14*  --   --  1.83* 1.69*   < > = values in this interval not displayed.    Estimated Creatinine Clearance: 59.7 mL/min (A) (by C-G formula based on SCr of 1.69 mg/dL (H)).   Medical History: Past Medical History:  Diagnosis Date  . Asthma    as a teenager - does not use an inhaler although pt said he has an albuteral inhaler  . Atrial fibrillation ([place])    persistant 02/2009  . Atrial flutter ([place])    s/p CTI ablation 08/06/10  . Cataract   . Chronic rhinitis   . Colon polyp   . Congestive heart failure ([place])   . Diverticulosis    colonoscopy 04/03/2009  . Hyperlipidemia   . Hypertension   . Morbid obesity ([place])    target weight = 219  for BMI < 30  . Nonischemic cardiomyopathy ([facility])    tachycardia mediated  . Seasonal allergies   . Sleep apnea    original 2001 - wears C-PaP  . Type 2 diabetes mellitus ([facility])       Assessment: 64yom with Hx LA thrombus previously on rivaroxaban and most recently on dabigitran with ongoing LA thrombus per TEE 2/18 .   Plan heparin bridge to warfarin with goal 2.5.   Heparin drip 2750 uts/hr Heparin level 0.5 at goal, heparin  in short supply will transition to enoxaparin 1mg  /kg q12h wt 122kg  Crcl >71ml/min Warfarin  7.5mg  x1 INR 1.3   Goal of Therapy:  Monitor platelets by anticoagulation protocol: Yes   Plan:  Stop heparin at 4pm Enoxaparin 120mg  sq q12h warfarin 7.5mg  x1  Daily Protime  Monitor s/s bleeding   Bonnita Nasuti Pharm.D. CPP, BCPS Clinical Pharmacist [phone] 08/08/2020 2:19 PM

## 2020-08-09 ENCOUNTER — Telehealth (HOSPITAL_COMMUNITY): Payer: Self-pay | Admitting: Pharmacy Technician

## 2020-08-09 LAB — COMPREHENSIVE METABOLIC PANEL
ALT: 68 U/L — ABNORMAL HIGH (ref 0–44)
AST: 32 U/L (ref 15–41)
Albumin: 2.7 g/dL — ABNORMAL LOW (ref 3.5–5.0)
Alkaline Phosphatase: 60 U/L (ref 38–126)
Anion gap: 12 (ref 5–15)
BUN: 30 mg/dL — ABNORMAL HIGH (ref 8–23)
CO2: 25 mmol/L (ref 22–32)
Calcium: 8.4 mg/dL — ABNORMAL LOW (ref 8.9–10.3)
Chloride: 94 mmol/L — ABNORMAL LOW (ref 98–111)
Creatinine, Ser: 1.91 mg/dL — ABNORMAL HIGH (ref 0.61–1.24)
GFR, Estimated: 38 mL/min — ABNORMAL LOW (ref >60)
Glucose, Bld: 309 mg/dL — ABNORMAL HIGH (ref 70–99)
Potassium: 4.3 mmol/L (ref 3.5–5.1)
Sodium: 131 mmol/L — ABNORMAL LOW (ref 135–145)
Total Bilirubin: 0.9 mg/dL (ref 0.3–1.2)
Total Protein: 5.9 g/dL — ABNORMAL LOW (ref 6.5–8.1)

## 2020-08-09 LAB — COOXEMETRY PANEL
Carboxyhemoglobin: 1.3 % (ref 0.5–1.5)
Methemoglobin: 0.8 % (ref 0.0–1.5)
O2 Saturation: 55.3 %
Total hemoglobin: 16.6 g/dL — ABNORMAL HIGH (ref 12.0–16.0)

## 2020-08-09 LAB — CBC
HCT: 49 % (ref 39.0–52.0)
Hemoglobin: 16 g/dL (ref 13.0–17.0)
MCH: 29.4 pg (ref 26.0–34.0)
MCHC: 32.7 g/dL (ref 30.0–36.0)
MCV: 90.1 fL (ref 80.0–100.0)
Platelets: 193 10*3/uL (ref 150–400)
RBC: 5.44 MIL/uL (ref 4.22–5.81)
RDW: 14.6 % (ref 11.5–15.5)
WBC: 8 10*3/uL (ref 4.0–10.5)
nRBC: 0 % (ref 0.0–0.2)

## 2020-08-09 LAB — MAGNESIUM: Magnesium: 1.9 mg/dL (ref 1.7–2.4)

## 2020-08-09 LAB — PROTIME-INR
INR: 1.6 — ABNORMAL HIGH (ref 0.8–1.2)
Prothrombin Time: 18.3 seconds — ABNORMAL HIGH (ref 11.4–15.2)

## 2020-08-09 LAB — GLUCOSE, CAPILLARY
Glucose-Capillary: 112 mg/dL — ABNORMAL HIGH (ref 70–99)
Glucose-Capillary: 195 mg/dL — ABNORMAL HIGH (ref 70–99)
Glucose-Capillary: 112 mg/dL — ABNORMAL HIGH (ref 70–99)
Glucose-Capillary: 156 mg/dL — ABNORMAL HIGH (ref 70–99)

## 2020-08-09 MED ORDER — WARFARIN SODIUM 7.5 MG PO TABS
7.5000 mg | ORAL_TABLET | Freq: Once | ORAL | Status: AC
  Administered 2020-08-09: 7.5 mg via ORAL
  Filled 2020-08-09: qty 1

## 2020-08-09 MED ORDER — HYDRALAZINE HCL 50 MG PO TABS
75.0000 mg | ORAL_TABLET | Freq: Three times a day (TID) | ORAL | Status: DC
  Administered 2020-08-09 – 2020-08-10 (×3): 75 mg via ORAL
  Filled 2020-08-09 (×3): qty 1

## 2020-08-09 NOTE — Progress Notes (Signed)
Patient ID: [name], male   DOB: Aug 08, 1955, 65 y.o.   MRN: [id number]     Advanced Heart Failure Rounding Note  PCP-Cardiologist: Buford Dresser, MD   Subjective:    Remains on milrinone 0.125 mcg + amio drip.  Yesterday diuresed again 7-8 with IV lasix.  Co-ox 55% today.  He remains in NSR after DCCV.   TEE-guided DCCV to NSR 2/23.  TEE showed EF 25%, mild-moderate RV dysfunction, mild-moderate MR, no LA appendage thrombus.   RHC Procedural Findings (on milrinone 0.25): Hemodynamics (mmHg) RA mean 12 RV 50/12 PA 55/34 PCWP unable to obtain.  Due to patient's size, the catheter was too short to reach the wedge position on the left or right sides.  Oxygen saturations: PA 64% AO 99% Cardiac Output (Fick) 4.5  Cardiac Index (Fick) 1.8   Objective:   Weight Range: 120.8 kg Body mass index is 35.15 kg/m.   Vital Signs:   Temp:  [97.6 F (36.4 C)-98.1 F (36.7 C)] 97.7 F (36.5 C) (02/24 1305) Pulse Rate:  [72-122] 77 (02/24 1305) Resp:  [16-20] 20 (02/24 1305) BP: (134-159)/(93-107) 134/95 (02/24 1305) SpO2:  [95 %-99 %] 98 % (02/24 1305) Weight:  [120.8 kg] 120.8 kg (02/24 0443) Last BM Date: 08/07/20  Weight change: Filed Weights   08/07/20 0531 08/08/20 0527 08/09/20 0443  Weight: 125.3 kg 122.5 kg 120.8 kg    Intake/Output:   Intake/Output Summary (Last 24 hours) at 08/09/2020 1319 Last data filed at 08/09/2020 1257 Gross per 24 hour  Intake 1534.23 ml  Output 3625 ml  Net -2090.77 ml      Physical Exam   CVP 7-8 General: NAD Neck: No JVD, no thyromegaly or thyroid nodule.  Lungs: Clear to auscultation bilaterally with normal respiratory effort. CV: Nondisplaced PMI.  Heart regular S1/S2, no S3/S4, no murmur.  No peripheral edema.   Abdomen: Soft, nontender, no hepatosplenomegaly, no distention.  Skin: Intact without lesions or rashes.  Neurologic: Alert and oriented x 3.  Psych: Normal affect. Extremities: No clubbing or cyanosis.   HEENT: Normal.    Telemetry    NSR 80s-90s (personally reviewed)   Labs    CBC Recent Labs    08/08/20 0536 08/09/20 0422  WBC 7.0 8.0  HGB 16.2 16.0  HCT 47.8 49.0  MCV 88.7 90.1  PLT 202 390   Basic Metabolic Panel Recent Labs    08/08/20 0536 08/09/20 0422  NA 137 131*  K 3.6 4.3  CL 97* 94*  CO2 28 25  GLUCOSE 122* 309*  BUN 29* 30*  CREATININE 1.69* 1.91*  CALCIUM 8.6* 8.4*  MG  --  1.9   Liver Function Tests Recent Labs    08/08/20 0536 08/09/20 0422  AST 41 32  ALT 93* 68*  ALKPHOS 64 60  BILITOT 0.9 0.9  PROT 6.3* 5.9*  ALBUMIN 2.9* 2.7*   No results for input(s): LIPASE, AMYLASE in the last 72 hours. Cardiac Enzymes No results for input(s): CKTOTAL, CKMB, CKMBINDEX, TROPONINI in the last 72 hours.  BNP: BNP (last 3 results) Recent Labs    08/03/20 1559  BNP 1,668.7*    ProBNP (last 3 results) No results for input(s): PROBNP in the last 8760 hours.   D-Dimer No results for input(s): DDIMER in the last 72 hours. Hemoglobin A1C No results for input(s): HGBA1C in the last 72 hours. Fasting Lipid Panel No results for input(s): CHOL, HDL, LDLCALC, TRIG, CHOLHDL, LDLDIRECT in the last 72 hours. Thyroid Function Tests  No results for input(s): TSH, T4TOTAL, T3FREE, THYROIDAB in the last 72 hours.  Invalid input(s): FREET3  Other results:   Imaging    No results found.   Medications:     Scheduled Medications: . Chlorhexidine Gluconate Cloth  6 each Topical Daily  . digoxin  0.125 mg Oral Daily  . empagliflozin  10 mg Oral Daily  . enoxaparin (LOVENOX) injection  120 mg Subcutaneous Q12H  . hydrALAZINE  75 mg Oral Q8H  . insulin aspart  0-15 Units Subcutaneous TID WC  . insulin aspart  0-5 Units Subcutaneous QHS  . isosorbide mononitrate  60 mg Oral Daily  . levothyroxine  50 mcg Oral Q0600  . metoprolol succinate  12.5 mg Oral Daily  . rosuvastatin  10 mg Oral Daily  . sodium chloride flush  10-40 mL Intracatheter  Q12H  . sodium chloride flush  3 mL Intravenous Q12H  . sodium chloride flush  3 mL Intravenous Q12H  . spironolactone  25 mg Oral Daily  . warfarin  7.5 mg Oral ONCE-1600  . Warfarin - Pharmacist Dosing Inpatient   Does not apply q1600    Infusions: . sodium chloride    . amiodarone 30 mg/hr (08/09/20 1256)    PRN Medications: sodium chloride, acetaminophen, sodium chloride flush, sodium chloride flush    Assessment/Plan   1. Acute on chronic systolic CHF: TEE 7/42 with EF <10%, severe RV dysfunction, LA appendage thrombus. Echoes in the past have had EF ranging 30-45%.  Possible tachycardia-mediated cardiomyopathy as he seems to become markedly more symptomatic when in atrial fibrillation though interestingly, HR initially was not elevated (in 80s).  Alternatively, he could have another cause for cardiomyopathy and atrial fibrillation just causes him to decompensate.  Echo 2/19 with EF 25-30% on my review, moderately decreased RV systolic function. Cool extremities initially with co-ox 35%, still low at 50% this morning with milrinone 0.25.  Creatinine 2.16 => 1.99 => 2.14 => 1.69.  [place] 2/21 with filling pressures still elevated, CI low at 1.8. Good diuresis, CVP down to 7-8 today.  Now s/p DCCV and back in NSR, hopefully this will help his cardiac output and if this is a tachy-mediated CMP, improve EF over time. Co-ox 55%.   - Stop milrinone today.  - Stop IV Lasix, start po diuretic tomorrow.   - Continue digoxin, dapagliflozin, and spironolactone.  - Increase hydralazine to 75 mg three time a day, increase Imdur to 90 mg daily.  - Eventually need to rule out coronary disease as underlying cause for cardiomyopathy (if not ischemic CMP, likely tachy-mediated CMP). Coronary angiography not done earlier this week with creatinine higher at 2.14.  Will need in future, timing to be decided => will be after a month or so of therapeutic anticoagulation post-DCCV.   2. Atrial fibrillation:  Persistent.  Ablation planned in 6/20 but had LA thrombus despite Xarelto use.  He converted back to NSR spontaneously.  Not on amiodarone or beta blockade due to reported history of bradycardia.  Had apparently been in NSR until the week PTA, was seen in cardiology clinic with atrial fibrillation RVR around 120.  He has been compliant with Pradaxa, but had LA appendage thrombus again seen on TEE 2/18, no DCCV therefore.   TEE 2/22 showed no LA appendage thrombus on IV heparin gtt + warfarin, so DCCV done.  Now back in NSR.  - Continue amiodarone gtt today, to po tomorrow.  - With clots despite Xarelto and Pradaxa use, he was  started heparin gtt + warfarin.  Will aim for INR 2.5-3. INR 1.3 today.  Could potentially go home on Lovenox bridge.  - Long-term, would keep on amiodarone and aim for AF ablation.  3. AKI on CKD stage 3: Creatinine 1.9 now, seems to be near his baseline. 2.16 at admission.  Suspect cardiorenal.  Follow closely.  - Stop IV Lasix.  4. OSA: Continue CPAP.  5. Type 2 diabetes: Started on Farxiga.  6. Elevated LFTs: Suspect congestive hepatopathy.  Trending down with inotropes.  Possibly home tomorrow if stable off milrinone.    Loralie Champagne 08/09/2020 1:19 PM

## 2020-08-09 NOTE — Progress Notes (Signed)
ANTICOAGULATION CONSULT NOTE - Follow UpConsult Pharmacy Consult for heparin + warfarin Indication: LA Thrombus  No Known Allergies  Patient Measurements: Height: 6\' 1"  (185.4 cm) Weight: 120.8 kg (266 lb 6.4 oz) IBW/kg (Calculated) : 79.9 Heparin Dosing Weight: 110kg   Vital Signs: Temp: 97.8 F (36.6 C) (02/24 0821) Temp Source: Oral (02/24 0821) BP: 137/103 (02/24 0821) Pulse Rate: 91 (02/24 0821)  Labs: Recent Labs    08/07/20 0545 08/08/20 0536 08/09/20 0422 08/09/20 0650  HGB 15.9 16.2 16.0  --   HCT 46.7 47.8 49.0  --   PLT 191 202 193  --   LABPROT 15.5* 15.7*  --  18.3*  INR 1.3* 1.3*  --  1.6*  HEPARINUNFRC 0.49 0.52  --   --   CREATININE 1.83* 1.69* 1.91*  --     Estimated Creatinine Clearance: 52.5 mL/min (A) (by C-G formula based on SCr of 1.91 mg/dL (H)).   Medical History: Past Medical History:  Diagnosis Date  . Asthma    as a teenager - does not use an inhaler although pt said he has an albuteral inhaler  . Atrial fibrillation ([place])    persistant 02/2009  . Atrial flutter ([place])    s/p CTI ablation 08/06/10  . Cataract   . Chronic rhinitis   . Colon polyp   . Congestive heart failure ([place])   . Diverticulosis    colonoscopy 04/03/2009  . Hyperlipidemia   . Hypertension   . Morbid obesity ([place])    target weight = 219  for BMI < 30  . Nonischemic cardiomyopathy (H[facility])    tachycardia mediated  . Seasonal allergies   . Sleep apnea    original 2001 - wears C-PaP  . Type 2 diabetes mellitus (HCC)       Assessment: 64yom with Hx LA thrombus previously on rivaroxaban and most recently on dabigitran with ongoing LA thrombus per TEE 2/18 .   Plan heparin bridge to warfarin with goal 2.5.   Heparin drip> changed to enoxaparin 120mg  (1mg /kg) q12h  heparin  in short supply wt 122kg  Crcl >84ml/min Warfarin 7.5mg  x1 INR 1.6 starting to bump repeat same   Goal of Therapy:  Monitor platelets by anticoagulation protocol: Yes   Plan:  Enoxaparin  120mg  sq q12h warfarin 7.5mg  x1 repeat Daily Protime  Monitor s/s bleeding   Bonnita Nasuti Pharm.D. CPP, BCPS Clinical Pharmacist [phone] 08/09/2020 12:25 PM

## 2020-08-09 NOTE — Telephone Encounter (Signed)
Patient's current 30 day [employer] co-pay is $94. Started an application for [employer].  Will fax in once signature are obtained.

## 2020-08-09 NOTE — Discharge Instructions (Signed)
Electrical Cardioversion Electrical cardioversion is the delivery of a jolt of electricity to restore a normal rhythm to the heart. A rhythm that is too fast or is not regular keeps the heart from pumping well. In this procedure, sticky patches or metal paddles are placed on the chest to deliver electricity to the heart from a device. This procedure may be done in an emergency if:  There is low or no blood pressure as a result of the heart rhythm.  Normal rhythm must be restored as fast as possible to protect the brain and heart from further damage.  It may save a life. This may also be a scheduled procedure for irregular or fast heart rhythms that are not immediately life-threatening. Tell a health care provider about:  Any allergies you have.  All medicines you are taking, including vitamins, herbs, eye drops, creams, and over-the-counter medicines.  Any problems you or family members have had with anesthetic medicines.  Any blood disorders you have.  Any surgeries you have had.  Any medical conditions you have.  Whether you are pregnant or may be pregnant. What are the risks? Generally, this is a safe procedure. However, problems may occur, including:  Allergic reactions to medicines.  A blood clot that breaks free and travels to other parts of your body.  The possible return of an abnormal heart rhythm within hours or days after the procedure.  Your heart stopping (cardiac arrest). This is rare. What happens before the procedure? Medicines  Your health care provider may have you start taking: ? Blood-thinning medicines (anticoagulants) so your blood does not clot as easily. ? Medicines to help stabilize your heart rate and rhythm.  Ask your health care provider about: ? Changing or stopping your regular medicines. This is especially important if you are taking diabetes medicines or blood thinners. ? Taking medicines such as aspirin and ibuprofen. These medicines can  thin your blood. Do not take these medicines unless your health care provider tells you to take them. ? Taking over-the-counter medicines, vitamins, herbs, and supplements. General instructions  Follow instructions from your health care provider about eating or drinking restrictions.  Plan to have someone take you home from the hospital or clinic.  If you will be going home right after the procedure, plan to have someone with you for 24 hours.  Ask your health care provider what steps will be taken to help prevent infection. These may include washing your skin with a germ-killing soap. What happens during the procedure?  An IV will be inserted into one of your veins.  Sticky patches (electrodes) or metal paddles may be placed on your chest.  You will be given a medicine to help you relax (sedative).  An electrical shock will be delivered. The procedure may vary among health care providers and hospitals.   What can I expect after the procedure?  Your blood pressure, heart rate, breathing rate, and blood oxygen level will be monitored until you leave the hospital or clinic.  Your heart rhythm will be watched to make sure it does not change.  You may have some redness on the skin where the shocks were given. Follow these instructions at home:  Do not drive for 24 hours if you were given a sedative during your procedure.  Take over-the-counter and prescription medicines only as told by your health care provider.  Ask your health care provider how to check your pulse. Check it often.  Rest for 48 hours after the procedure  or as told by your health care provider.  Avoid or limit your caffeine use as told by your health care provider.  Keep all follow-up visits as told by your health care provider. This is important. Contact a health care provider if:  You feel like your heart is beating too quickly or your pulse is not regular.  You have a serious muscle cramp that does not go  away. Get help right away if:  You have discomfort in your chest.  You are dizzy or you feel faint.  You have trouble breathing or you are short of breath.  Your speech is slurred.  You have trouble moving an arm or leg on one side of your body.  Your fingers or toes turn cold or blue. Summary  Electrical cardioversion is the delivery of a jolt of electricity to restore a normal rhythm to the heart.  This procedure may be done right away in an emergency or may be a scheduled procedure if the condition is not an emergency.  Generally, this is a safe procedure.  After the procedure, check your pulse often as told by your health care provider. This information is not intended to replace advice given to you by your health care provider. Make sure you discuss any questions you have with your health care provider. Document Revised: 01/03/2019 Document Reviewed: 01/03/2019 Elsevier Patient Education  [name]. Transesophageal Echocardiogram Transesophageal echocardiogram (TEE) is a test that uses sound waves to take pictures of your heart. TEE is done by passing a small probe attached to a flexible tube down the part of the body that moves food from your mouth to your stomach (esophagus). The pictures give clear images of your heart. This can help your doctor see if there are problems with your heart. Tell a doctor about:  Any allergies you have.  All medicines you are taking. This includes vitamins, herbs, eye drops, creams, and over-the-counter medicines.  Any problems you or family members have had with anesthetic medicines.  Any blood disorders you have.  Any surgeries you have had.  Any medical conditions you have.  Any swallowing problems.  Whether you have or have had a blockage in the part of the body that moves food from your mouth to your stomach.  Whether you are pregnant or may be pregnant. What are the risks? In general, this is a safe procedure. But,  problems may occur, such as:  Damage to nearby structures or organs.  A tear in the part of the body that moves food from your mouth to your stomach.  Irregular heartbeat.  Hoarse voice or trouble swallowing.  Bleeding. What happens before the procedure? Medicines  Ask your doctor about changing or stopping: ? Your normal medicines. ? Vitamins, herbs, and supplements. ? Over-the-counter medicines.  Do not take aspirin or ibuprofen unless you are told to. General instructions  Follow instructions from your doctor about what you cannot eat or drink.  You will take out any dentures or dental retainers.  Plan to have a responsible adult take you home from the hospital or clinic.  Plan to have a responsible adult care for you for the time you are told after you leave the hospital or clinic. This is important. What happens during the procedure?  An IV will be put into one of your veins.  You may be given: ? A sedative. This medicine helps you relax. ? A medicine to numb the back of your throat. This may be  sprayed or gargled.  Your blood pressure, heart rate, and breathing will be watched.  You may be asked to lie on your left side.  A bite block will be placed in your mouth. This keeps you from biting the tube.  The tip of the probe will be placed into the back of your mouth.  You will be asked to swallow.  Your doctor will take pictures of your heart.  The probe and bite block will be taken out after the test is done. The procedure may vary among doctors and hospitals.   What can I expect after the procedure?  You will be monitored until you leave the hospital or clinic. This includes checking your blood pressure, heart rate, breathing rate, and blood oxygen level.  Your throat may feel sore and numb. This will get better over time. You will not be allowed to eat or drink until the numbness has gone away.  It is common to have a sore throat for a day or two.  It  is up to you to get the results of your procedure. Ask how to get your results when they are ready. Follow these instructions at home:  If you were given a sedative during your procedure, do not drive or use machines until your doctor says that it is safe.  Return to your normal activities when your doctor says that it is safe.  Keep all follow-up visits. Summary  TEE is a test that uses sound waves to take pictures of your heart.  You will be given a medicine to help you relax.  Do not drive or use machines until your doctor says that it is safe. This information is not intended to replace advice given to you by your health care provider. Make sure you discuss any questions you have with your health care provider. Document Revised: 01/24/2020 Document Reviewed: 01/24/2020 Elsevier Patient Education  2021 [place] on my medicine - Coumadin   (Warfarin)  This medication education was reviewed with me or my healthcare representative as part of my discharge preparation.  Why was Coumadin prescribed for you? Coumadin was prescribed for you because you have a blood clot or a medical condition that can cause an increased risk of forming blood clots. Blood clots can cause serious health problems by blocking the flow of blood to the heart, lung, or brain. Coumadin can prevent harmful blood clots from forming. As a reminder your indication for Coumadin is:   Stroke Prevention Because Of Atrial Fibrillation  What test will check on my response to Coumadin? While on Coumadin (warfarin) you will need to have an INR test regularly to ensure that your dose is keeping you in the desired range. The INR (international normalized ratio) number is calculated from the result of the laboratory test called prothrombin time (PT).  If an INR APPOINTMENT HAS NOT ALREADY BEEN MADE FOR YOU please schedule an appointment to have this lab work done by your health care provider within 7  days. Your INR goal is  a number between:  2.5-3   What  do you need to  know  About  COUMADIN? Take Coumadin (warfarin) exactly as prescribed by your healthcare provider about the same time each day.  DO NOT stop taking without talking to the doctor who prescribed the medication.  Stopping without other blood clot prevention medication to take the place of Coumadin may increase your risk of developing a new clot or stroke.  Get  refills before you run out.  What do you do if you miss a dose? If you miss a dose, take it as soon as you remember on the same day then continue your regularly scheduled regimen the next day.  Do not take two doses of Coumadin at the same time.  Important Safety Information A possible side effect of Coumadin (Warfarin) is an increased risk of bleeding. You should call your healthcare provider right away if you experience any of the following: ? Bleeding from an injury or your nose that does not stop. ? Unusual colored urine (red or dark brown) or unusual colored stools (red or black). ? Unusual bruising for unknown reasons. ? A serious fall or if you hit your head (even if there is no bleeding).  Some foods or medicines interact with Coumadin (warfarin) and might alter your response to warfarin. To help avoid this: ? Eat a balanced diet, maintaining a consistent amount of Vitamin K. ? Notify your provider about major diet changes you plan to make. ? Avoid alcohol or limit your intake to 1 drink for women and 2 drinks for men per day. (1 drink is 5 oz. wine, 12 oz. beer, or 1.5 oz. liquor.)  Make sure that ANY health care provider who prescribes medication for you knows that you are taking Coumadin (warfarin).  Also make sure the healthcare provider who is monitoring your Coumadin knows when you have started a new medication including herbals and non-prescription products.  Coumadin (Warfarin)  Major Drug Interactions  Increased Warfarin Effect Decreased Warfarin  Effect  Alcohol (large quantities) Antibiotics (esp. Septra/Bactrim, Flagyl, Cipro) Amiodarone (Cordarone) Aspirin (ASA) Cimetidine (Tagamet) Megestrol (Megace) NSAIDs (ibuprofen, naproxen, etc.) Piroxicam (Feldene) Propafenone (Rythmol SR) Propranolol (Inderal) Isoniazid (INH) Posaconazole (Noxafil) Barbiturates (Phenobarbital) Carbamazepine (Tegretol) Chlordiazepoxide (Librium) Cholestyramine (Questran) Griseofulvin Oral Contraceptives Rifampin Sucralfate (Carafate) Vitamin K   Coumadin (Warfarin) Major Herbal Interactions  Increased Warfarin Effect Decreased Warfarin Effect  Garlic Ginseng Ginkgo biloba Coenzyme Q10 Green tea St. Johns wort    Coumadin (Warfarin) FOOD Interactions  Eat a consistent number of servings per week of foods HIGH in Vitamin K (1 serving =  cup)  Collards (cooked, or boiled & drained) Kale (cooked, or boiled & drained) Mustard greens (cooked, or boiled & drained) Parsley *serving size only =  cup Spinach (cooked, or boiled & drained) Swiss chard (cooked, or boiled & drained) Turnip greens (cooked, or boiled & drained)  Eat a consistent number of servings per week of foods MEDIUM-HIGH in Vitamin K (1 serving = 1 cup)  Asparagus (cooked, or boiled & drained) Broccoli (cooked, boiled & drained, or raw & chopped) Brussel sprouts (cooked, or boiled & drained) *serving size only =  cup Lettuce, raw (green leaf, endive, romaine) Spinach, raw Turnip greens, raw & chopped   These websites have more information on Coumadin (warfarin):  FailFactory.se; VeganReport.com.au;

## 2020-08-09 NOTE — Plan of Care (Signed)
  Problem: Clinical Measurements: Goal: Ability to maintain clinical measurements within normal limits will improve Outcome: Progressing   Problem: Clinical Measurements: Goal: Will remain free from infection Outcome: Progressing   

## 2020-08-10 ENCOUNTER — Other Ambulatory Visit: Payer: Self-pay | Admitting: Cardiology

## 2020-08-10 LAB — COOXEMETRY PANEL
Carboxyhemoglobin: 1.3 % (ref 0.5–1.5)
Methemoglobin: 0.6 % (ref 0.0–1.5)
O2 Saturation: 56.2 %
Total hemoglobin: 17.3 g/dL — ABNORMAL HIGH (ref 12.0–16.0)

## 2020-08-10 LAB — COMPREHENSIVE METABOLIC PANEL
ALT: 59 U/L — ABNORMAL HIGH (ref 0–44)
AST: 30 U/L (ref 15–41)
Albumin: 3 g/dL — ABNORMAL LOW (ref 3.5–5.0)
Alkaline Phosphatase: 64 U/L (ref 38–126)
Anion gap: 10 (ref 5–15)
BUN: 32 mg/dL — ABNORMAL HIGH (ref 8–23)
CO2: 26 mmol/L (ref 22–32)
Calcium: 8.8 mg/dL — ABNORMAL LOW (ref 8.9–10.3)
Chloride: 97 mmol/L — ABNORMAL LOW (ref 98–111)
Creatinine, Ser: 1.82 mg/dL — ABNORMAL HIGH (ref 0.61–1.24)
GFR, Estimated: 41 mL/min — ABNORMAL LOW (ref >60)
Glucose, Bld: 112 mg/dL — ABNORMAL HIGH (ref 70–99)
Potassium: 4 mmol/L (ref 3.5–5.1)
Sodium: 133 mmol/L — ABNORMAL LOW (ref 135–145)
Total Bilirubin: 1.2 mg/dL (ref 0.3–1.2)
Total Protein: 6.5 g/dL (ref 6.5–8.1)

## 2020-08-10 LAB — GLUCOSE, CAPILLARY
Glucose-Capillary: 125 mg/dL — ABNORMAL HIGH (ref 70–99)
Glucose-Capillary: 109 mg/dL — ABNORMAL HIGH (ref 70–99)

## 2020-08-10 LAB — PROTIME-INR
INR: 1.7 — ABNORMAL HIGH (ref 0.8–1.2)
Prothrombin Time: 19.1 seconds — ABNORMAL HIGH (ref 11.4–15.2)

## 2020-08-10 LAB — MAGNESIUM: Magnesium: 2.2 mg/dL (ref 1.7–2.4)

## 2020-08-10 MED ORDER — WARFARIN SODIUM 5 MG PO TABS: 7.5000 mg | ORAL_TABLET | Freq: Every day | ORAL | 0 refills | Status: DC

## 2020-08-10 MED ORDER — LOSARTAN POTASSIUM 25 MG PO TABS
12.5000 mg | ORAL_TABLET | Freq: Every day | ORAL | Status: DC
  Administered 2020-08-10: 12.5 mg via ORAL
  Filled 2020-08-10: qty 1

## 2020-08-10 MED ORDER — SPIRONOLACTONE 25 MG PO TABS: 25.0000 mg | ORAL_TABLET | Freq: Every day | ORAL | 5 refills | Status: DC

## 2020-08-10 MED ORDER — FUROSEMIDE 40 MG PO TABS
40.0000 mg | ORAL_TABLET | Freq: Every day | ORAL | Status: DC
  Administered 2020-08-10: 40 mg via ORAL
  Filled 2020-08-10: qty 1

## 2020-08-10 MED ORDER — ENOXAPARIN SODIUM 120 MG/0.8ML ~~LOC~~ SOLN: 120.0000 mg | Freq: Two times a day (BID) | SUBCUTANEOUS | 0 refills | Status: DC

## 2020-08-10 MED ORDER — CARVEDILOL 3.125 MG PO TABS
3.1250 mg | ORAL_TABLET | Freq: Two times a day (BID) | ORAL | Status: DC
  Administered 2020-08-10: 3.125 mg via ORAL
  Filled 2020-08-10: qty 1

## 2020-08-10 MED ORDER — ISOSORBIDE MONONITRATE ER 60 MG PO TB24: 60.0000 mg | ORAL_TABLET | Freq: Every day | ORAL | 5 refills | Status: DC

## 2020-08-10 MED ORDER — AMIODARONE HCL 200 MG PO TABS: ORAL_TABLET | ORAL | 1 refills | Status: DC

## 2020-08-10 MED ORDER — WARFARIN SODIUM 7.5 MG PO TABS: 7.5000 mg | ORAL_TABLET | Freq: Every day | ORAL | Status: DC

## 2020-08-10 MED ORDER — WARFARIN SODIUM 10 MG PO TABS
10.0000 mg | ORAL_TABLET | Freq: Once | ORAL | Status: AC
  Administered 2020-08-10: 10 mg via ORAL
  Filled 2020-08-10: qty 1

## 2020-08-10 MED ORDER — DIGOXIN 125 MCG PO TABS: 0.1250 mg | ORAL_TABLET | Freq: Every day | ORAL | 5 refills | Status: DC

## 2020-08-10 MED ORDER — FUROSEMIDE 40 MG PO TABS: 40.0000 mg | ORAL_TABLET | Freq: Every day | ORAL | 5 refills | Status: DC

## 2020-08-10 MED ORDER — EMPAGLIFLOZIN 10 MG PO TABS: 10.0000 mg | ORAL_TABLET | Freq: Every day | ORAL | 5 refills | Status: DC

## 2020-08-10 MED ORDER — LOSARTAN POTASSIUM 25 MG PO TABS: 12.5000 mg | ORAL_TABLET | Freq: Every day | ORAL | 5 refills | Status: DC

## 2020-08-10 MED ORDER — HYDRALAZINE HCL 50 MG PO TABS: 75.0000 mg | ORAL_TABLET | Freq: Three times a day (TID) | ORAL | 5 refills | Status: DC

## 2020-08-10 MED ORDER — AMIODARONE HCL 200 MG PO TABS
200.0000 mg | ORAL_TABLET | Freq: Two times a day (BID) | ORAL | Status: DC
  Administered 2020-08-10: 200 mg via ORAL
  Filled 2020-08-10: qty 1

## 2020-08-10 MED ORDER — CARVEDILOL 3.125 MG PO TABS: 3.1250 mg | ORAL_TABLET | Freq: Two times a day (BID) | ORAL | 5 refills | Status: DC

## 2020-08-10 MED FILL — JARDIANCE 10 MG TABLET: 10 | 14 days supply | Qty: 14 | Fill #0 | Status: TO

## 2020-08-10 MED FILL — LOSARTAN POTASSIUM 25 MG TA: 25 | 30 days supply | Qty: 15 | Fill #0 | Status: TO

## 2020-08-10 MED FILL — DIGOXIN 0.125 MG TABLET: 125 | 30 days supply | Qty: 30 | Fill #0 | Status: TO

## 2020-08-10 MED FILL — ISOSORBIDE MN ER 60 MG TAB: 60 | 30 days supply | Qty: 30 | Fill #0 | Status: TO

## 2020-08-10 MED FILL — AMIODARONE HCL 200 MG TAB: 200 | 30 days supply | Qty: 60 | Fill #0 | Status: TO

## 2020-08-10 MED FILL — FUROSEMIDE 40 MG TABLET: 40 | 30 days supply | Qty: 30 | Fill #0 | Status: TO

## 2020-08-10 MED FILL — ENOXAPARIN SODIUM 120 MG/0.: 120 | 5 days supply | Qty: 8 | Fill #0

## 2020-08-10 MED FILL — CARVEDILOL 3.125 MG TABLET: 3.125 | 30 days supply | Qty: 60 | Fill #0 | Status: TO

## 2020-08-10 MED FILL — SPIRONOLACTONE 25 MG TABLET: 25 | 30 days supply | Qty: 30 | Fill #0 | Status: TO

## 2020-08-10 MED FILL — WARFARIN SODIUM 5 MG TABLET: 5 | 30 days supply | Qty: 60 | Fill #0

## 2020-08-10 MED FILL — hydrALAZINE HCL 50 MG TABS: 50 | 30 days supply | Qty: 135 | Fill #0 | Status: TO

## 2020-08-10 NOTE — Progress Notes (Addendum)
Patient ID: [name], male   DOB: [birth year], 65 y.o.   MRN: [id number]     Advanced Heart Failure Rounding Note  PCP-Cardiologist: Buford Dresser, MD   Subjective:    Off milrinone today, co-ox 56%.  CVP 5-6.  He remains in NSR on IV amiodarone.  BP remains elevated.   TEE-guided DCCV to NSR 2/23.  TEE showed EF 25%, mild-moderate RV dysfunction, mild-moderate MR, no LA appendage thrombus.   [facility] Procedural Findings (on milrinone 0.25): Hemodynamics (mmHg) RA mean 12 RV 50/12 PA 55/34 PCWP unable to obtain.  Due to patient's size, the catheter was too short to reach the wedge position on the left or right sides.  Oxygen saturations: PA 64% AO 99% Cardiac Output (Fick) 4.5  Cardiac Index (Fick) 1.8   Objective:   Weight Range: 119.8 kg Body mass index is 34.84 kg/m.   Vital Signs:   Temp:  [97.7 F (36.5 C)-98.6 F (37 C)] 97.8 F (36.6 C) (02/25 0914) Pulse Rate:  [68-90] 85 (02/25 0921) Resp:  [18-20] 18 (02/25 0914) BP: (128-138)/(95-104) 138/101 (02/25 0914) SpO2:  [97 %-99 %] 99 % (02/25 0914) Weight:  [119.8 kg] 119.8 kg (02/25 0500) Last BM Date: 08/09/20  Weight change: Filed Weights   08/08/20 0527 08/09/20 0443 08/10/20 0500  Weight: 122.5 kg 120.8 kg 119.8 kg    Intake/Output:   Intake/Output Summary (Last 24 hours) at 08/10/2020 1007 Last data filed at 08/10/2020 0940 Gross per 24 hour  Intake 1667.15 ml  Output 1760 ml  Net -92.85 ml      Physical Exam   CVP 5-6 General: NAD Neck: No JVD, no thyromegaly or thyroid nodule.  Lungs: Clear to auscultation bilaterally with normal respiratory effort. CV: Nondisplaced PMI.  Heart regular S1/S2, no S3/S4, no murmur.  No peripheral edema.   Abdomen: Soft, nontender, no hepatosplenomegaly, no distention.  Skin: Intact without lesions or rashes.  Neurologic: Alert and oriented x 3.  Psych: Normal affect. Extremities: No clubbing or cyanosis.  HEENT: Normal.   Telemetry    NSR  80s-90s (personally reviewed)   Labs    CBC Recent Labs    08/08/20 0536 08/09/20 0422  WBC 7.0 8.0  HGB 16.2 16.0  HCT 47.8 49.0  MCV 88.7 90.1  PLT 202 694   Basic Metabolic Panel Recent Labs    08/09/20 0422 08/10/20 0425  NA 131* 133*  K 4.3 4.0  CL 94* 97*  CO2 25 26  GLUCOSE 309* 112*  BUN 30* 32*  CREATININE 1.91* 1.82*  CALCIUM 8.4* 8.8*  MG 1.9 2.2   Liver Function Tests Recent Labs    08/09/20 0422 08/10/20 0425  AST 32 30  ALT 68* 59*  ALKPHOS 60 64  BILITOT 0.9 1.2  PROT 5.9* 6.5  ALBUMIN 2.7* 3.0*   No results for input(s): LIPASE, AMYLASE in the last 72 hours. Cardiac Enzymes No results for input(s): CKTOTAL, CKMB, CKMBINDEX, TROPONINI in the last 72 hours.  BNP: BNP (last 3 results) Recent Labs    08/03/20 1559  BNP 1,668.7*    ProBNP (last 3 results) No results for input(s): PROBNP in the last 8760 hours.   D-Dimer No results for input(s): DDIMER in the last 72 hours. Hemoglobin A1C No results for input(s): HGBA1C in the last 72 hours. Fasting Lipid Panel No results for input(s): CHOL, HDL, LDLCALC, TRIG, CHOLHDL, LDLDIRECT in the last 72 hours. Thyroid Function Tests No results for input(s): TSH, T4TOTAL, T3FREE, THYROIDAB in the  last 72 hours.  Invalid input(s): FREET3  Other results:   Imaging    No results found.   Medications:     Scheduled Medications: . amiodarone  200 mg Oral BID  . carvedilol  3.125 mg Oral BID WC  . Chlorhexidine Gluconate Cloth  6 each Topical Daily  . digoxin  0.125 mg Oral Daily  . empagliflozin  10 mg Oral Daily  . enoxaparin (LOVENOX) injection  120 mg Subcutaneous Q12H  . hydrALAZINE  75 mg Oral Q8H  . insulin aspart  0-15 Units Subcutaneous TID WC  . insulin aspart  0-5 Units Subcutaneous QHS  . isosorbide mononitrate  60 mg Oral Daily  . levothyroxine  50 mcg Oral Q0600  . losartan  12.5 mg Oral Daily  . metoprolol succinate  12.5 mg Oral Daily  . rosuvastatin  10 mg Oral  Daily  . sodium chloride flush  10-40 mL Intracatheter Q12H  . sodium chloride flush  3 mL Intravenous Q12H  . sodium chloride flush  3 mL Intravenous Q12H  . spironolactone  25 mg Oral Daily  . Warfarin - Pharmacist Dosing Inpatient   Does not apply q1600    Infusions: . sodium chloride      PRN Medications: sodium chloride, acetaminophen, sodium chloride flush, sodium chloride flush    Assessment/Plan   1. Acute on chronic systolic CHF: TEE 1/30 with EF <10%, severe RV dysfunction, LA appendage thrombus. Echoes in the past have had EF ranging 30-45%.  Possible tachycardia-mediated cardiomyopathy as he seems to become markedly more symptomatic when in atrial fibrillation though interestingly, HR initially was not elevated (in 80s).  Alternatively, he could have another cause for cardiomyopathy and atrial fibrillation just causes him to decompensate.  Echo 2/19 with EF 25-30% on my review, moderately decreased RV systolic function. Cool extremities initially with co-ox 35% and milrinone started.  Creatinine stable today at 1.82.  [place] 2/21 with filling pressures still elevated, CI low at 1.8. Good diuresis, CVP down to 5-6 today.  Now s/p DCCV and back in NSR, hopefully this will help his cardiac output and if this is a tachy-mediated CMP, improve EF over time. Co-ox 56%, he is now off milrinone.   - Start Lasix 40 mg po daily for home.  - Start Coreg 3.125 mg bid.  - Stable creatinine, start losartan 12.5 daily.  - Continue digoxin, dapagliflozin, and spironolactone.  - Continue hydralazine 75 mg three time a day and Imdur 90 mg daily.  - Eventually need to rule out coronary disease as underlying cause for cardiomyopathy (if not ischemic CMP, likely tachy-mediated CMP). Coronary angiography not done this admission with elevated creatinine (now lower). Will need in future, timing to be decided => will be after a month or so of therapeutic anticoagulation post-DCCV as long as creatinine  remains < 2.    2. Atrial fibrillation: Persistent.  Ablation planned in 6/20 but had LA thrombus despite Xarelto use.  He converted back to NSR spontaneously.  Not on amiodarone or beta blockade due to reported history of bradycardia.  Had apparently been in NSR until the week PTA, was seen in cardiology clinic with atrial fibrillation RVR around 120. He had been compliant with Pradaxa, but had LA appendage thrombus again seen on TEE 2/18, no DCCV therefore.   TEE 2/22 showed no LA appendage thrombus on IV heparin gtt + warfarin, so DCCV done.  Now back in NSR.  - Transition from IV to po amiodarone 200 mg bid.   -  With clots despite Xarelto and Pradaxa use, he was started Lovenox [place] + warfarin.  Will aim for INR 2.5-3. INR 1.37today.  Can go home on Lovenox bridge.  - Long-term, would keep on amiodarone and aim for AF ablation (followup with Dr. Rayann Heman).  3. AKI on CKD stage 3: Creatinine 1.8 now, seems to be near his baseline. 2.16 at admission.  Suspect cardiorenal.  Follow closely.  4. OSA: Continue CPAP.  5. Type 2 diabetes: Started on Farxiga.  6. Elevated LFTs: Suspect congestive hepatopathy.  Trending down.  7. Disposition: Home today, will need followup in coumadin clinic at [facility].  He is on warfarin with Lovenox bridge, goal INR for now is going to be 2.5-3.  He needs close followup in CHF clinic and will need appointment with Dr. Rayann Heman to assess for AF ablation.  Meds for discharge: Lovenox bridge + warfarin, Farxiga 10 mg daily, hydralazine 75 mg tid + Imdur 60 daily, Coreg 3.125 mg bid, digoxin 0.125 daily, Lasix 40 mg daily, spironolactone 25 mg daily, losartan 12.5 mg daily, amiodarone 200 mg bid x 2 wks then 200 mg daily.   Loralie Champagne 08/10/2020 10:07 AM

## 2020-08-10 NOTE — Discharge Summary (Signed)
Advanced Heart Failure Team  Discharge Summary   Patient ID: [name] MRN: [id number], DOB/AGE: Dec 12, 1955 65 y.o. Admit date: 08/03/2020 D/C date:     08/10/2020   Primary Discharge Diagnoses:  Acute on Chronic Biventricular Heart Failure Persistent Atrial Fibrillation  AKI on Stage III CKD Type 2DM OSA  Chronic Anticoagulation Therapy     Hospital Course:   65 y/o male w/ h/o NICM, presumed to be tachy mediated from atrial fibrillation and atrial flutter.   Paroxsymal Afib/flutter dates back to ~2012. Had Aflutter ablation in 2012. He has had several echos between 2012-2018 showing normal LVEF.   Found to have reduced EF on echo in 04/2018, down to 30-35%. RV normal. Was in afib at the time.   He has not been able to tolerate amiodarone nor ? blockers due to bradycardia.   11/2018 was referred for Afib ablation but procedure was aborted after TEE showed LA thrombus, despite being on Xarelto. Anticoagulation changed to Pradaxa. He ultimately spontaneously converted back to NSR.   Repeat echo 10/20 showed EF 35-40%. RV ok.  Plans were made for [facility] 10/20 however had asymptomatic covid positive test, then renal failure. Ischemic eval changed to Lexiscan NST which showed no reversible ischemia.  Echo repeated 3/21, EF 40-45%. G1DD. RV normal.   Recently seen in cardiology clinic w/ NYHA Class IIIb symptoms and found to be back I Afib in the 120s. Placed on low dose metoprolol and referred for outpatient TEE/DCCV.   Presented for outpatient procedure on 2/18. TEE showed LA thrombus. DCCV canceled. EF lower on TEE ~5-10%. RV severely reduced. Pt was directly admitted NYHA Class III CHF and diuresis.   He was started on IV Lasix and PICC line was placed. Milrinone started for low output, initial co-ox 35%. Pradaxa stopped and he was placed on coumadin w/ heparin bridge. Placed on IV amio for afib. He had repeat TEE on 2/22 that showed no LAA thrombus so DCCV was done and  successful. Transitioned to PO amio 200 mg bid. Remained in NSR. Diuresed well w/ IV Lasix and able to wean of milrinone w/ stable co-ox.   On 2/25, he was seen and examined by Dr. Aundra Dubin and felt stable for d/c home. He will be discharged on Lovenox to bridge until therapeutic INR. INR 1.7 day of d/c. Appt in the coumadin clinic has been arranged. INR goal will be 2.5-3.0. He will also f/u w/ Dr. Rayann Heman to discuss Afib Ablation. Post hospital f/u in the [facility] also arranged.   See Detailed Hospital Problem List Below  1. Acute on chronic systolic CHF: TEE 6/78 with EF <10%, severe RV dysfunction, LA appendage thrombus. Echoes in the past have had EF ranging 30-45%. Possible tachycardia-mediated cardiomyopathy as he seems to become markedly more symptomatic when in atrial fibrillation though interestingly, HR initially was not elevated (in 80s). Alternatively, he could have another cause for cardiomyopathy and atrial fibrillation just causes him to decompensate. Echo 2/19 with EF 25-30% on my review, moderately decreased RV systolic function. Cool extremities initially with co-ox 35% and milrinone started.  Creatinine stable today at 1.82. [place] 2/21 with filling pressures still elevated, CI low at 1.8. Good diuresis, CVP down to 5-6 today.  Now s/p DCCV and back in NSR, hopefully this will help his cardiac output and if this is a tachy-mediated CMP, improve EF over time. Co-ox 56%, he is now off milrinone.   - Start Lasix 40 mg po daily for home.  - Start  Coreg 3.125 mg bid.  - Stable creatinine, start losartan 12.5 daily.  - Continue digoxin, dapagliflozin, and spironolactone.  - Continue hydralazine 75 mg three time a day and Imdur 90 mg daily.  - Eventually need to rule out coronary disease as underlying cause for cardiomyopathy (if not ischemic CMP, likely tachy-mediated CMP). Coronary angiography not done this admission with elevated creatinine (now lower). Will need in future, timing to be decided  => will be after a month or so of therapeutic anticoagulation post-DCCV as long as creatinine remains < 2.    2. Atrial fibrillation: Persistent. Ablation planned in 6/20 but had LA thrombus despite Xarelto use. He converted back to NSR spontaneously. Not on amiodarone or beta blockade due to reported history of bradycardia. Had apparently been in NSR until the week PTA, was seen in cardiology clinic with atrial fibrillation RVR around 120. He had been compliant with Pradaxa, but had LA appendage thrombus again seen on TEE 2/18, no DCCV therefore.  TEE 2/22 showed no LA appendage thrombus on IV heparin gtt + warfarin, so DCCV done.  Now back in NSR.  - Transition from IV to po amiodarone 200 mg bid.   - With clots despite Xarelto and Pradaxa use, he was started Lovenox [place] + warfarin. Will aim for INR 2.5-3. INR 1.37today.  Can go home on Lovenox bridge.  - Long-term, would keep on amiodarone and aim for AF ablation (followup with Dr. Rayann Heman).  3. AKI on CKD stage 3: Creatinine 1.8 now, seems to be near his baseline. 2.16 at admission.  Suspect cardiorenal.  Follow closely.  4. OSA: Continue CPAP.  5. Type 2 diabetes: Started on Farxiga.  6. Elevated LFTs: Suspect congestive hepatopathy.  Trending down.  7. Disposition: Home today, will need followup in coumadin clinic at [facility].  He is on warfarin with Lovenox bridge, goal INR for now is going to be 2.5-3.  He needs close followup in CHF clinic and will need appointment with Dr. Rayann Heman to assess for AF ablation.  Meds for discharge: Lovenox bridge + warfarin, Farxiga 10 mg daily, hydralazine 75 mg tid + Imdur 60 daily, Coreg 3.125 mg bid, digoxin 0.125 daily, Lasix 40 mg daily, spironolactone 25 mg daily, losartan 12.5 mg daily, amiodarone 200 mg bid x 2 wks then 200 mg daily.    Discharge Weight Range: 264 lb  Discharge Vitals: Blood pressure (!) 122/102, pulse 82, temperature 97.8 F (36.6 C), temperature source Oral, resp. rate 18, height  6\' 1"  (1.854 m), weight 119.8 kg, SpO2 99 %.  Labs: Lab Results  Component Value Date   WBC 8.0 08/09/2020   HGB 16.0 08/09/2020   HCT 49.0 08/09/2020   MCV 90.1 08/09/2020   PLT 193 08/09/2020    Recent Labs  Lab 08/10/20 0425  NA 133*  K 4.0  CL 97*  CO2 26  BUN 32*  CREATININE 1.82*  CALCIUM 8.8*  PROT 6.5  BILITOT 1.2  ALKPHOS 64  ALT 59*  AST 30  GLUCOSE 112*   Lab Results  Component Value Date   CHOL 143 05/16/2020   HDL 53.20 05/16/2020   LDLCALC 69 05/16/2020   TRIG 107.0 05/16/2020   BNP (last 3 results) Recent Labs    08/03/20 1559  BNP 1,668.7*    ProBNP (last 3 results) No results for input(s): PROBNP in the last 8760 hours.   Diagnostic Studies/Procedures   No results found.  Discharge Medications   Allergies as of 08/10/2020  No Known Allergies     Medication List    STOP taking these medications   dabigatran 150 MG Caps capsule Commonly known as: Pradaxa   metoprolol tartrate 25 MG tablet Commonly known as: LOPRESSOR   rosuvastatin 10 MG tablet Commonly known as: CRESTOR     TAKE these medications   albuterol 108 (90 Base) MCG/ACT inhaler Commonly known as: VENTOLIN HFA Inhale 2 puffs into the lungs every 6 (six) hours as needed for wheezing or shortness of breath.   amiodarone 200 MG tablet Commonly known as: PACERONE Take 1 tablet 2 times a day for 2 weeks then reduce to 1 tablet daily   B-D UF III MINI PEN NEEDLES 31G X 5 MM Misc Generic drug: Insulin Pen Needle 1 EACH BY OTHER ROUTE 4 (FOUR) TIMES DAILY. E11.9   carvedilol 3.125 MG tablet Commonly known as: COREG Take 1 tablet (3.125 mg total) by mouth 2 (two) times daily with a meal.   digoxin 0.125 MG tablet Commonly known as: LANOXIN Take 1 tablet (0.125 mg total) by mouth daily. Start taking on: August 11, 2020   doxylamine (Sleep) 25 MG tablet Commonly known as: UNISOM Take 50 mg by mouth at bedtime as needed for sleep.   empagliflozin 10 MG Tabs  tablet Commonly known as: JARDIANCE Take 1 tablet (10 mg total) by mouth daily. Start taking on: August 11, 2020   enoxaparin 120 MG/0.8ML injection Commonly known as: LOVENOX Inject 0.8 mLs (120 mg total) into the skin every 12 (twelve) hours for 5 days.   FreeStyle Libre 14 Day Reader Kerrin Mo USE AS DIRECTED   FreeStyle Libre 14 Day Sensor Misc Use as directed by Diabetes educator   furosemide 40 MG tablet Commonly known as: LASIX Take 1 tablet (40 mg total) by mouth daily.   gabapentin 300 MG capsule Commonly known as: NEURONTIN TAKE 1 CAPSULE BY MOUTH THREE TIMES A DAY   hydrALAZINE 50 MG tablet Commonly known as: APRESOLINE Take 1.5 tablets (75 mg total) by mouth every 8 (eight) hours.   insulin aspart 100 UNIT/ML FlexPen Commonly known as: NOVOLOG Inject 8-12 Units into the skin 3 (three) times daily with meals. What changed: how much to take   isosorbide mononitrate 60 MG 24 hr tablet Commonly known as: IMDUR Take 1 tablet (60 mg total) by mouth daily. Start taking on: August 11, 2020   levothyroxine 50 MCG tablet Commonly known as: SYNTHROID TAKE 1 TABLET BY MOUTH DAILY BEFORE BREAKFAST   losartan 25 MG tablet Commonly known as: COZAAR Take 0.5 tablets (12.5 mg total) by mouth daily. Start taking on: August 11, 2020 What changed:   how much to take  when to take this   mometasone 50 MCG/ACT nasal spray Commonly known as: NASONEX Place 2 sprays into the nose daily as needed (allergies or rhinitis).   multivitamin with minerals tablet Take 2 tablets by mouth daily. Chewable   Ozempic (0.25 or 0.5 MG/DOSE) 2 MG/1.5ML Sopn Generic drug: Semaglutide(0.25 or 0.5MG /DOS) INJECT 0.5 MG INTO THE SKIN ONCE A WEEK.   spironolactone 25 MG tablet Commonly known as: ALDACTONE Take 1 tablet (25 mg total) by mouth daily.   Symbicort 80-4.5 MCG/ACT inhaler Generic drug: budesonide-formoterol USE 2 INHALATIONS TWICE A DAY What changed: See the new  instructions.   thiamine 100 MG tablet Take 1 tablet (100 mg total) by mouth daily.   Tyler Aas FlexTouch 200 UNIT/ML FlexTouch Pen Generic drug: insulin degludec Inject 40 Units into the skin daily.  vitamin C 500 MG tablet Commonly known as: ASCORBIC ACID Take 500 mg by mouth daily.   warfarin 5 MG tablet Commonly known as: COUMADIN Take 1.5-2 tablets (7.5-10 mg total) by mouth daily at 4 PM. Take 10mg  (2 tablets) 2/26 and 2/27 then take 7.5 (1.5 tablets) all other days until seen in coumadin clinic. Start taking on: August 11, 2020       Disposition   The patient will be discharged in stable condition to home.   Follow-up Information    [facility] Follow up on 08/21/2020.   Specialty: Cardiology Why: 12:00 PM [facility] at [facility], [place] [address]   Contact information: [address] [id number] [place] [phone]       [place] [facility] Follow up on 08/13/2020.   Specialty: Cardiology Why: Coumadin Clinic  10:30 AM   Contact information: [address], [place] [phone]       Thompson Grayer, MD Follow up.   Specialty: Cardiology Why: Dr. Jackalyn [place] office will contact you regarding an appointment to discuss atrial fibrillation ablation  Contact information: [address] [place] [address] [phone]                 Duration of Discharge Encounter: Greater than 35 minutes   Signed, Lyda Jester, PA-C   08/10/2020, 12:22 PM

## 2020-08-10 NOTE — Plan of Care (Signed)
  Problem: Education: Goal: Ability to demonstrate management of disease process will improve Outcome: Adequate for Discharge Goal: Ability to verbalize understanding of medication therapies will improve Outcome: Adequate for Discharge Goal: Individualized Educational Video(s) Outcome: Adequate for Discharge

## 2020-08-10 NOTE — Care Management Important Message (Signed)
Important Message  Patient Details  Name: [name] MRN: [id number] Date of Birth: 1955/10/22   Medicare Important Message Given:  Yes     Shelda Altes 08/10/2020, 11:28 AM

## 2020-08-10 NOTE — Progress Notes (Signed)
D/C instructions given and reviewed. No questions asked but encouraged to call with any concerns. Tele and IV removed, tolerated well. Pharmacy in with pt now.

## 2020-08-10 NOTE — Progress Notes (Signed)
ANTICOAGULATION CONSULT NOTE - Follow UpConsult Pharmacy Consult for heparin + warfarin Indication: LA Thrombus  No Known Allergies  Patient Measurements: Height: 6\' 1"  (185.4 cm) Weight: 119.8 kg (264 lb 1.6 oz) IBW/kg (Calculated) : 79.9 Heparin Dosing Weight: 110kg   Vital Signs: Temp: 97.8 F (36.6 C) (02/25 0914) Temp Source: Oral (02/25 0914) BP: 138/101 (02/25 0914) Pulse Rate: 85 (02/25 0921)  Labs: Recent Labs    08/08/20 0536 08/09/20 0422 08/09/20 0650 08/10/20 0425  HGB 16.2 16.0  --   --   HCT 47.8 49.0  --   --   PLT 202 193  --   --   LABPROT 15.7*  --  18.3* 19.1*  INR 1.3*  --  1.6* 1.7*  HEPARINUNFRC 0.52  --   --   --   CREATININE 1.69* 1.91*  --  1.82*    Estimated Creatinine Clearance: 54.9 mL/min (A) (by C-G formula based on SCr of 1.82 mg/dL (H)).   Medical History: Past Medical History:  Diagnosis Date  . Asthma    as a teenager - does not use an inhaler although pt said he has an albuteral inhaler  . Atrial fibrillation ([place])    persistant 02/2009  . Atrial flutter ([place])    s/p CTI ablation 08/06/10  . Cataract   . Chronic rhinitis   . Colon polyp   . Congestive heart failure ([place])   . Diverticulosis    colonoscopy 04/03/2009  . Hyperlipidemia   . Hypertension   . Morbid obesity ([place])    target weight = 219  for BMI < 30  . Nonischemic cardiomyopathy ([facility])    tachycardia mediated  . Seasonal allergies   . Sleep apnea    original 2001 - wears C-PaP  . Type 2 diabetes mellitus ([facility])       Assessment: 64yom with Hx LA thrombus previously on rivaroxaban and most recently on dabigitran with ongoing LA thrombus per TEE 2/18 .   Plan heparin bridge to warfarin with goal 2.5.    Heparin drip> changed to enoxaparin 120mg  (1mg /kg) q12h 2/23  INR still below goal, trended up to 1.7 this morning  Goal of Therapy:  Monitor platelets by anticoagulation protocol: Yes   Plan:  Enoxaparin 120mg  sq q12h at discharge Plan for  warfarin 10mg  x2 days then back to 7.5mg  daily Needs follow up next week with coumadin clinic -Monday preferable given INR today.   Erin Hearing PharmD., BCPS Clinical Pharmacist 08/10/2020 10:24 AM

## 2020-08-11 ENCOUNTER — Other Ambulatory Visit: Payer: Self-pay | Admitting: Internal Medicine

## 2020-08-13 ENCOUNTER — Ambulatory Visit (INDEPENDENT_AMBULATORY_CARE_PROVIDER_SITE_OTHER): Payer: Medicare HMO

## 2020-08-13 ENCOUNTER — Other Ambulatory Visit: Payer: Self-pay

## 2020-08-13 DIAGNOSIS — I513 Intracardiac thrombosis, not elsewhere classified: Secondary | ICD-10-CM | POA: Diagnosis not present

## 2020-08-13 DIAGNOSIS — Z5181 Encounter for therapeutic drug level monitoring: Secondary | ICD-10-CM

## 2020-08-13 DIAGNOSIS — I4891 Unspecified atrial fibrillation: Secondary | ICD-10-CM | POA: Diagnosis not present

## 2020-08-13 LAB — POCT INR: INR: 3.7 — AB (ref 2.0–3.0)

## 2020-08-13 NOTE — Patient Instructions (Addendum)
-   STOP LOVENOX! - since you've already taken your warfarin today, have a serving of greens today, then - continue dosage of warfarin 1.5 tablets every day - Recheck on Friday  (Pt lives in [place] - [address])  Please call the Coumadin clinic w/ any questions or concerns at [phone]

## 2020-08-13 NOTE — Progress Notes (Signed)

## 2020-08-14 MED ORDER — FREESTYLE LIBRE 14 DAY SENSOR MISC: 11 refills | Status: DC

## 2020-08-16 ENCOUNTER — Encounter: Payer: Self-pay | Admitting: Internal Medicine

## 2020-08-16 ENCOUNTER — Ambulatory Visit (INDEPENDENT_AMBULATORY_CARE_PROVIDER_SITE_OTHER): Payer: Medicare HMO | Admitting: Internal Medicine

## 2020-08-16 ENCOUNTER — Other Ambulatory Visit: Payer: Self-pay

## 2020-08-16 ENCOUNTER — Inpatient Hospital Stay: Payer: Medicare HMO | Admitting: Internal Medicine

## 2020-08-16 DIAGNOSIS — E1169 Type 2 diabetes mellitus with other specified complication: Secondary | ICD-10-CM

## 2020-08-16 DIAGNOSIS — E785 Hyperlipidemia, unspecified: Secondary | ICD-10-CM

## 2020-08-16 DIAGNOSIS — I5022 Chronic systolic (congestive) heart failure: Secondary | ICD-10-CM | POA: Diagnosis not present

## 2020-08-16 DIAGNOSIS — I513 Intracardiac thrombosis, not elsewhere classified: Secondary | ICD-10-CM

## 2020-08-16 NOTE — Progress Notes (Unsigned)
   Subjective:   Patient ID: [name], male    DOB: 10/12/1955, 65 y.o.   MRN: [id number]  HPI The patient is a 65 YO man coming in for hospital follow up (in with A fib and severe heart failure, on milrinone drip and lasix with some improvement, did have conversion and persistent LA thrombus despite adequate NOAC, on heparin drip and warfarin with resolution and will be maintained on lovenox until coumadin therapeutic with goal 2.5-3.5). He will follow up with cardiology to discuss A fib ablation after evaluation with cath for ischemic cardiomyopathy. Doing well overall and feels much improved. Denies chest pains or SOB. Does not feel he is back in A fib. Denies abdominal pain or diarrhea or constipation. Denies blood in stool. Taking coumadin and visit soon to recheck levels and with cardiology next week.  Denies low blood sugar but jardiance was started in the hospital. He is taking this and ozempic and tresiba 40 units qhs and novolog 8-12 units with meals. Several times since leaving hospital his morning sugar is in the 80s and since he does not eat immediately he has just skipped the morning novolog for concern this would cause it to go low.  Review of Systems  Constitutional: Negative.   HENT: Negative.   Eyes: Negative.   Respiratory: Negative for cough, chest tightness and shortness of breath.   Cardiovascular: Negative for chest pain, palpitations and leg swelling.  Gastrointestinal: Negative for abdominal distention, abdominal pain, constipation, diarrhea, nausea and vomiting.  Musculoskeletal: Negative.   Skin: Negative.   Neurological: Negative.   Psychiatric/Behavioral: Negative.     Objective:  Physical Exam Constitutional:      Appearance: He is well-developed and well-nourished. He is obese.  HENT:     Head: Normocephalic and atraumatic.  Eyes:     Extraocular Movements: EOM normal.  Cardiovascular:     Rate and Rhythm: Normal rate and regular rhythm.  Pulmonary:      Effort: Pulmonary effort is normal. No respiratory distress.     Breath sounds: Normal breath sounds. No wheezing or rales.  Abdominal:     General: Bowel sounds are normal. There is no distension.     Palpations: Abdomen is soft.     Tenderness: There is no abdominal tenderness. There is no rebound.  Musculoskeletal:        General: No edema.     Cervical back: Normal range of motion.  Skin:    General: Skin is warm and dry.  Neurological:     Mental Status: He is alert and oriented to person, place, and time.     Coordination: Coordination normal.  Psychiatric:        Mood and Affect: Mood and affect normal.     Vitals:   08/16/20 1312  BP: 132/70  Pulse: 83  Resp: 18  Temp: 97.9 F (36.6 C)  TempSrc: Oral  SpO2: 98%  Weight: 271 lb 9.6 oz (123.2 kg)  Height: 6\' 1"  (1.854 m)    This visit occurred during the SARS-CoV-2 public health emergency.  Safety protocols were in place, including screening questions prior to the visit, additional usage of staff PPE, and extensive cleaning of exam room while observing appropriate contact time as indicated for disinfecting solutions.   Assessment & Plan:

## 2020-08-16 NOTE — Patient Instructions (Signed)
We will check with Dr. Cruzita Lederer about the sugar medicines but we may need to adjust those.

## 2020-08-17 ENCOUNTER — Ambulatory Visit (INDEPENDENT_AMBULATORY_CARE_PROVIDER_SITE_OTHER): Payer: Medicare HMO | Admitting: *Deleted

## 2020-08-17 ENCOUNTER — Other Ambulatory Visit: Payer: Self-pay

## 2020-08-17 ENCOUNTER — Encounter: Payer: Self-pay | Admitting: Internal Medicine

## 2020-08-17 DIAGNOSIS — I4891 Unspecified atrial fibrillation: Secondary | ICD-10-CM | POA: Diagnosis not present

## 2020-08-17 DIAGNOSIS — Z5181 Encounter for therapeutic drug level monitoring: Secondary | ICD-10-CM

## 2020-08-17 DIAGNOSIS — I513 Intracardiac thrombosis, not elsewhere classified: Secondary | ICD-10-CM | POA: Diagnosis not present

## 2020-08-17 LAB — POCT INR: INR: 3.5 — AB (ref 2.0–3.0)

## 2020-08-17 NOTE — Assessment & Plan Note (Signed)
I discussed with him my concerns for low blood sugars given that he was started on jardiance. His HgA1c is 5.3 during recent stay. He is taking jardiance 10 mg daily, tresiba 40 units qhs, novolog with meals 8-12 (depending on size of meal) but has had to skip this at times due to low 80s prior to meals and ozempic. Will reach out to his endocrine provider and see if he can stop ozempic at least. He is strongly encouraged to monitor for low sugars and contact us or endo if they start to go low.

## 2020-08-17 NOTE — Patient Instructions (Signed)
Description   Take 1/2 a tablet of warfarin today, then continue to take 1.5 tablets by mouth daily. Be consistent with your 2 to 3 servings of greens per week. Recheck INR in 1 week. Please call the Coumadin clinic w/ any questions or concerns at [phone]

## 2020-08-17 NOTE — Assessment & Plan Note (Signed)
Awaiting ischemic workup and then potentially A fib ablation to prevent decompensation.

## 2020-08-17 NOTE — Assessment & Plan Note (Signed)
Now on coumadin with goal INR 2.5-3.5. Following with cardiology coumadin clinic.

## 2020-08-20 NOTE — H&P (View-Only) (Signed)
PCP:  Dr Sharlet Salina  Primary Cardiologist: dr Harrell Gave HF MD: Dr Aundra Dubin  EP: Dr Rayann Heman   HPI: 65 y/o male w/ h/o NICM, presumed to be tachy mediated from atrial fibrillation/atrial flutter, LA thrombus, .   Paroxsymal Afib/flutter dates back to ~2012. Had Aflutter ablation in 2012. He has had several echos between 2012-2018 showing normal LVEF.   Found to have reduced EF on echo in 04/2018, down to 30-35%. RV normal. Was in afib at the time.   He has not been able to tolerate amiodarone nor?blockers due to bradycardia.   11/2018 was referred for Afib ablation but procedure was aborted after TEE showed LA thrombus, despite being on Xarelto. Anticoagulation changed to Pradaxa.He ultimately spontaneously converted back to NSR.  Repeat echo 10/20 showed EF 35-40%. RV ok. Plans were made forR/LHC 10/20 howeverhad asymptomatic covid positive test, then renal failure.Ischemic eval changed toLexiscanNST which showed noreversible ischemia.  Echo repeated 3/21, EF 40-45%. G1DD. RV normal.   Recently seen in cardiology clinic w/ NYHA Class IIIb symptoms and found to be back I Afib in the 120s. Placed on low dose metoprolol and referred for outpatient TEE/DCCV.   Presented for outpatient procedure on 2/18. TEE showed LA thrombus. DCCV canceled. EF lower on TEE ~5-10%. RVseverelyreduced. Admitted and started on IV Lasix and PICC line was placed. Milrinone started for low output, initial co-ox 35%. Pradaxa stopped and he was placed on coumadin w/ heparin bridge. Placed on IV amio for afib. He had repeat TEE on 2/22 that showed no LAA thrombus so DCCV was done and successful. Transitioned to PO amio 200 mg bid. Remained in NSR. Diuresed well w/ IV Lasix and able to wean of milrinone. Discharge weight 264 pounds.   Today he returns for post hospital HF follow up.Overall feeling fine. Denies SOB/PND/Orthopnea. No bleeding issues. Drinking 3 beers a day.  Using CPAP at night. Appetite  ok. No fever or chills. Weight at home 266 pounds. Taking all medications. He switched losartan to a full tablet, 25 mg daily (didn't want to take half pill)  and also changed hydralazine to 100 mg twice a day (didn't want to take 3 times a day). Lives alone.   ROS: All systems negative except as listed in HPI, PMH and Problem List.  SH:  Social History   Socioeconomic History  . Marital status: Single    Spouse name: Not on file  . Number of children: 0  . Years of education: Not on file  . Highest education level: Not on file  Occupational History  . Occupation: Sales  Tobacco Use  . Smoking status: Never Smoker  . Smokeless tobacco: Never Used  Vaping Use  . Vaping Use: Never used  Substance and Sexual Activity  . Alcohol use: Yes    Alcohol/week: 21.0 standard drinks    Types: 21 Cans of beer per week    Comment: previously quite heavy, working on cessation   . Drug use: No  . Sexual activity: Not on file  Other Topics Concern  . Not on file  Social History Narrative   Lives in [place] and works for [employer]   Social Determinants of Health   Financial Resource Strain: Not on file  Food Insecurity: Not on file  Transportation Needs: Not on file  Physical Activity: Not on file  Stress: Not on file  Social Connections: Not on file  Intimate Partner Violence: Not on file    FH:  Family History  Problem Relation  Age of Onset  . Hypertension Mother   . Kidney failure Mother        Due to sepsis  . COPD Father   . Colon cancer Brother        dx in 15's  . Esophageal cancer Neg Hx   . Rectal cancer Neg Hx   . Stomach cancer Neg Hx     Past Medical History:  Diagnosis Date  . Asthma    as a teenager - does not use an inhaler although pt said he has an albuteral inhaler  . Atrial fibrillation ([place])    persistant 02/2009  . Atrial flutter ([place])    s/p CTI ablation 08/06/10  . Cataract   . Chronic rhinitis   . Colon polyp   . Congestive heart failure  ([place])   . Diverticulosis    colonoscopy 04/03/2009  . Hyperlipidemia   . Hypertension   . Morbid obesity ([place])    target weight = 219  for BMI < 30  . Nonischemic cardiomyopathy ([facility])    tachycardia mediated  . Seasonal allergies   . Sleep apnea    original 2001 - wears C-PaP  . Type 2 diabetes mellitus ([place])     Current Outpatient Medications  Medication Sig Dispense Refill  . albuterol (PROVENTIL HFA;VENTOLIN HFA) 108 (90 Base) MCG/ACT inhaler Inhale 2 puffs into the lungs every 6 (six) hours as needed for wheezing or shortness of breath.    [name] amiodarone (PACERONE) 200 MG tablet Take 1 tablet 2 times a day for 2 weeks then reduce to 1 tablet daily 60 tablet 1  . B-D UF III MINI PEN NEEDLES 31G X 5 MM MISC 1 EACH BY OTHER ROUTE 4 (FOUR) TIMES DAILY. E11.9 100 each 4  . carvedilol (COREG) 3.125 MG tablet Take 1 tablet (3.125 mg total) by mouth 2 (two) times daily with a meal. 60 tablet 5  . Continuous Blood Gluc Receiver (FREESTYLE LIBRE 14 DAY READER) DEVI USE AS DIRECTED 1 each 11  . Continuous Blood Gluc Sensor (FREESTYLE LIBRE 14 DAY SENSOR) MISC Use as directed by Diabetes educator 2 each 11  . digoxin (LANOXIN) 0.125 MG tablet Take 1 tablet (0.125 mg total) by mouth daily. 30 tablet 5  . doxylamine, Sleep, (UNISOM) 25 MG tablet Take 50 mg by mouth at bedtime as needed for sleep.     . empagliflozin (JARDIANCE) 10 MG TABS tablet Take 1 tablet (10 mg total) by mouth daily. 30 tablet 5  . furosemide (LASIX) 40 MG tablet Take 1 tablet (40 mg total) by mouth daily. 30 tablet 5  . hydrALAZINE (APRESOLINE) 50 MG tablet Take 100 mg by mouth 2 (two) times daily.    . insulin aspart (NOVOLOG) 100 UNIT/ML FlexPen Inject 8-12 Units into the skin 3 (three) times daily with meals. (Patient taking differently: Inject 8 Units into the skin 3 (three) times daily with meals.) 30 mL 3  . insulin degludec (TRESIBA FLEXTOUCH) 200 UNIT/ML FlexTouch Pen Inject 40 Units into the skin daily. 9 mL 3  .  isosorbide mononitrate (IMDUR) 60 MG 24 hr tablet Take 1 tablet (60 mg total) by mouth daily. 30 tablet 5  . levothyroxine (SYNTHROID) 50 MCG tablet TAKE 1 TABLET BY MOUTH EVERY DAY BEFORE BREAKFAST 90 tablet 0  . losartan (COZAAR) 25 MG tablet Take 25 mg by mouth daily.    . mometasone (NASONEX) 50 MCG/ACT nasal spray Place 2 sprays into the nose daily as needed (allergies or rhinitis).    [name]  Multiple Vitamins-Minerals (MULTIVITAMIN WITH MINERALS) tablet Take 2 tablets by mouth daily. Chewable    . OZEMPIC, 0.25 OR 0.5 MG/DOSE, 2 MG/1.5ML SOPN INJECT 0.5 MG INTO THE SKIN ONCE A WEEK. 1.5 mL 5  . spironolactone (ALDACTONE) 25 MG tablet Take 1 tablet (25 mg total) by mouth daily. 30 tablet 5  . SYMBICORT 80-4.5 MCG/ACT inhaler USE 2 INHALATIONS TWICE A DAY (Patient taking differently: Inhale 2 puffs into the lungs 2 (two) times daily as needed (for flares).) 30.6 g 1  . thiamine 100 MG tablet Take 1 tablet (100 mg total) by mouth daily. 30 tablet 1  . vitamin C (ASCORBIC ACID) 500 MG tablet Take 500 mg by mouth daily.    [name] warfarin (COUMADIN) 5 MG tablet Take 1.5-2 tablets (7.5-10 mg total) by mouth daily at 4 PM. Take 10mg  (2 tablets) 2/26 and 2/27 then take 7.5 (1.5 tablets) all other days until seen in coumadin clinic. 60 tablet 0   No current facility-administered medications for this encounter.    Vitals:   08/21/20 1149  BP: 110/88  Pulse: 79  SpO2: 96%  Weight: 122.5 kg (270 lb)   Wt Readings from Last 3 Encounters:  08/21/20 122.5 kg (270 lb)  08/16/20 123.2 kg (271 lb 9.6 oz)  08/10/20 119.8 kg (264 lb 1.6 oz)     PHYSICAL EXAM: General:  Well appearing. No resp difficulty HEENT: normal Neck: supple. JVP 5-6 . Carotids 2+ bilaterally; no bruits. No lymphadenopathy or thryomegaly appreciated. Cor: PMI normal. Irregular rate & rhythm. No rubs, gallops or murmurs. Lungs: clear Abdomen: soft, nontender, nondistended. No hepatosplenomegaly. No bruits or masses. Good bowel  sounds. Extremities: no cyanosis, clubbing, rash, R and LLE trace edema. Neuro: alert & orientedx3, cranial nerves grossly intact. Moves all 4 extremities w/o difficulty. Affect pleasant.   ECG:  Af ib 61 bpm    ASSESSMENT & PLAN: 1.Chronic systolic CHF: TEE 3/79 with EF <10%, severe RV dysfunction, LA appendage thrombus. Echoes in the past have had EF ranging 30-45%. Possible tachycardia-mediated cardiomyopathy as he seems to become markedly more symptomatic when in atrial fibrillation though interestingly, HR initially was not elevated (in 80s). Alternatively, he could have another cause for cardiomyopathy and atrial fibrillation just causes him to decompensate.  Echo 08/04/2020 with EF 25-30%,  moderately decreased RV systolic function.  NYHA II. Volume status stable. Change lasix to 40 mg as needed for 3 pound weight gain in 24 hours.    - Continue  Coreg 3.125 mg bid.  - Stop losartan and start entresto 24-26 mg twice a day. Given 30 day free card.  - Continue digoxin 0.125 mg daily, check dig level - Continue  jardiance 10 mg daily.  - Continue spiro 25 mg daily.   -Continue hydralazine100 mg two times a day (he does not want to take three times a day)and Imdur60 mg daily.  - Eventually need to rule out coronary disease as underlying cause for cardiomyopathy (if not ischemic CMP, likely tachy-mediated CMP). Coronary angiography was not donerecent  admission with elevated creatinine.  - Consider cath soon---> will be after a month or so of therapeutic anticoagulation post-DCCV as long as creatinine remains < 2. 2. Atrial fibrillation: Persistent. Ablation planned in 6/20 but had LA thrombus despite Xarelto use. He converted back to NSR spontaneously. He had not been on amiodarone or beta blockade due to reported history of bradycardia. During last admit taken off pradaxa due to LA appendage thrombus again seen on TEE 2/18,  no DCCV therefore. He had another TEE 2/22 and this  showed no LA appendage thrombus on IV heparin gtt + warfarin, so DCCV done with conversion to NSR.  - Today he is back in A fib with controlled rate. Given he does not tolerate A fib we will set up for DC-CV. Will continue amio 200 mg twice a day.  -  With clots despite Xarelto and Pradaxa use, he was startedwarfarin. Will aim for INR 2.5-3. Check INR today.  - Long-term, would keep on amiodarone and aim for AF ablation(follow up with Dr. Rayann Heman).  - He has follow up with Dr Rayann Heman later this month.  3. CKD stage 3: Creatinine 1.8 Check BMET   4. OSA: Continue CPAP nightly.  5. Type 2 diabetes: Continue jardiance 10 mg daily.  6. Elevated LFTs: During recent hospitalization had elevated LFTs. Suspect congestive hepatopathy  Follow up next week for cardioversion. Discussed with Dr Aundra Dubin.  - Stopping losartan and switching to entresto today. Discussed purpose.  - Check BMET, INR, dig level today.  - He will need ECHO the end of May.  Devi Hopman NP-C  12:58 PM

## 2020-08-20 NOTE — Progress Notes (Signed)
PCP:  Dr Sharlet Salina  Primary Cardiologist: dr Harrell Gave HF MD: Dr Aundra Dubin  EP: Dr Rayann Heman   HPI: 65 y/o male w/ h/o NICM, presumed to be tachy mediated from atrial fibrillation/atrial flutter, LA thrombus, .   Paroxsymal Afib/flutter dates back to ~2012. Had Aflutter ablation in 2012. He has had several echos between 2012-2018 showing normal LVEF.   Found to have reduced EF on echo in 04/2018, down to 30-35%. RV normal. Was in afib at the time.   He has not been able to tolerate amiodarone nor?blockers due to bradycardia.   11/2018 was referred for Afib ablation but procedure was aborted after TEE showed LA thrombus, despite being on Xarelto. Anticoagulation changed to Pradaxa.He ultimately spontaneously converted back to NSR.  Repeat echo 10/20 showed EF 35-40%. RV ok. Plans were made forR/LHC 10/20 howeverhad asymptomatic covid positive test, then renal failure.Ischemic eval changed toLexiscanNST which showed noreversible ischemia.  Echo repeated 3/21, EF 40-45%. G1DD. RV normal.   Recently seen in cardiology clinic w/ NYHA Class IIIb symptoms and found to be back I Afib in the 120s. Placed on low dose metoprolol and referred for outpatient TEE/DCCV.   Presented for outpatient procedure on 2/18. TEE showed LA thrombus. DCCV canceled. EF lower on TEE ~5-10%. RVseverelyreduced. Admitted and started on IV Lasix and PICC line was placed. Milrinone started for low output, initial co-ox 35%. Pradaxa stopped and he was placed on coumadin w/ heparin bridge. Placed on IV amio for afib. He had repeat TEE on 2/22 that showed no LAA thrombus so DCCV was done and successful. Transitioned to PO amio 200 mg bid. Remained in NSR. Diuresed well w/ IV Lasix and able to wean of milrinone. Discharge weight 264 pounds.   Today he returns for post hospital HF follow up.Overall feeling fine. Denies SOB/PND/Orthopnea. No bleeding issues. Drinking 3 beers a day.  Using CPAP at night. Appetite  ok. No fever or chills. Weight at home 266 pounds. Taking all medications. He switched losartan to a full tablet, 25 mg daily (didn't want to take half pill)  and also changed hydralazine to 100 mg twice a day (didn't want to take 3 times a day). Lives alone.   ROS: All systems negative except as listed in HPI, PMH and Problem List.  SH:  Social History   Socioeconomic History  . Marital status: Single    Spouse name: Not on file  . Number of children: 0  . Years of education: Not on file  . Highest education level: Not on file  Occupational History  . Occupation: Sales  Tobacco Use  . Smoking status: Never Smoker  . Smokeless tobacco: Never Used  Vaping Use  . Vaping Use: Never used  Substance and Sexual Activity  . Alcohol use: Yes    Alcohol/week: 21.0 standard drinks    Types: 21 Cans of beer per week    Comment: previously quite heavy, working on cessation   . Drug use: No  . Sexual activity: Not on file  Other Topics Concern  . Not on file  Social History Narrative   Lives in [place] and works for [employer]   Social Determinants of Health   Financial Resource Strain: Not on file  Food Insecurity: Not on file  Transportation Needs: Not on file  Physical Activity: Not on file  Stress: Not on file  Social Connections: Not on file  Intimate Partner Violence: Not on file    FH:  Family History  Problem Relation  Age of Onset  . Hypertension Mother   . Kidney failure Mother        Due to sepsis  . COPD Father   . Colon cancer Brother        dx in 27's  . Esophageal cancer Neg Hx   . Rectal cancer Neg Hx   . Stomach cancer Neg Hx     Past Medical History:  Diagnosis Date  . Asthma    as a teenager - does not use an inhaler although pt said he has an albuteral inhaler  . Atrial fibrillation ([place])    persistant 02/2009  . Atrial flutter ([place])    s/p CTI ablation 08/06/10  . Cataract   . Chronic rhinitis   . Colon polyp   . Congestive heart failure  ([place])   . Diverticulosis    colonoscopy 04/03/2009  . Hyperlipidemia   . Hypertension   . Morbid obesity ([place])    target weight = 219  for BMI < 30  . Nonischemic cardiomyopathy ([facility])    tachycardia mediated  . Seasonal allergies   . Sleep apnea    original 2001 - wears C-PaP  . Type 2 diabetes mellitus ([place])     Current Outpatient Medications  Medication Sig Dispense Refill  . albuterol (PROVENTIL HFA;VENTOLIN HFA) 108 (90 Base) MCG/ACT inhaler Inhale 2 puffs into the lungs every 6 (six) hours as needed for wheezing or shortness of breath.    [name] amiodarone (PACERONE) 200 MG tablet Take 1 tablet 2 times a day for 2 weeks then reduce to 1 tablet daily 60 tablet 1  . B-D UF III MINI PEN NEEDLES 31G X 5 MM MISC 1 EACH BY OTHER ROUTE 4 (FOUR) TIMES DAILY. E11.9 100 each 4  . carvedilol (COREG) 3.125 MG tablet Take 1 tablet (3.125 mg total) by mouth 2 (two) times daily with a meal. 60 tablet 5  . Continuous Blood Gluc Receiver (FREESTYLE LIBRE 14 DAY READER) DEVI USE AS DIRECTED 1 each 11  . Continuous Blood Gluc Sensor (FREESTYLE LIBRE 14 DAY SENSOR) MISC Use as directed by Diabetes educator 2 each 11  . digoxin (LANOXIN) 0.125 MG tablet Take 1 tablet (0.125 mg total) by mouth daily. 30 tablet 5  . doxylamine, Sleep, (UNISOM) 25 MG tablet Take 50 mg by mouth at bedtime as needed for sleep.     . empagliflozin (JARDIANCE) 10 MG TABS tablet Take 1 tablet (10 mg total) by mouth daily. 30 tablet 5  . furosemide (LASIX) 40 MG tablet Take 1 tablet (40 mg total) by mouth daily. 30 tablet 5  . hydrALAZINE (APRESOLINE) 50 MG tablet Take 100 mg by mouth 2 (two) times daily.    . insulin aspart (NOVOLOG) 100 UNIT/ML FlexPen Inject 8-12 Units into the skin 3 (three) times daily with meals. (Patient taking differently: Inject 8 Units into the skin 3 (three) times daily with meals.) 30 mL 3  . insulin degludec (TRESIBA FLEXTOUCH) 200 UNIT/ML FlexTouch Pen Inject 40 Units into the skin daily. 9 mL 3  .  isosorbide mononitrate (IMDUR) 60 MG 24 hr tablet Take 1 tablet (60 mg total) by mouth daily. 30 tablet 5  . levothyroxine (SYNTHROID) 50 MCG tablet TAKE 1 TABLET BY MOUTH EVERY DAY BEFORE BREAKFAST 90 tablet 0  . losartan (COZAAR) 25 MG tablet Take 25 mg by mouth daily.    . mometasone (NASONEX) 50 MCG/ACT nasal spray Place 2 sprays into the nose daily as needed (allergies or rhinitis).    [name]  Multiple Vitamins-Minerals (MULTIVITAMIN WITH MINERALS) tablet Take 2 tablets by mouth daily. Chewable    . OZEMPIC, 0.25 OR 0.5 MG/DOSE, 2 MG/1.5ML SOPN INJECT 0.5 MG INTO THE SKIN ONCE A WEEK. 1.5 mL 5  . spironolactone (ALDACTONE) 25 MG tablet Take 1 tablet (25 mg total) by mouth daily. 30 tablet 5  . SYMBICORT 80-4.5 MCG/ACT inhaler USE 2 INHALATIONS TWICE A DAY (Patient taking differently: Inhale 2 puffs into the lungs 2 (two) times daily as needed (for flares).) 30.6 g 1  . thiamine 100 MG tablet Take 1 tablet (100 mg total) by mouth daily. 30 tablet 1  . vitamin C (ASCORBIC ACID) 500 MG tablet Take 500 mg by mouth daily.    [name] warfarin (COUMADIN) 5 MG tablet Take 1.5-2 tablets (7.5-10 mg total) by mouth daily at 4 PM. Take 10mg  (2 tablets) 2/26 and 2/27 then take 7.5 (1.5 tablets) all other days until seen in coumadin clinic. 60 tablet 0   No current facility-administered medications for this encounter.    Vitals:   08/21/20 1149  BP: 110/88  Pulse: 79  SpO2: 96%  Weight: 122.5 kg (270 lb)   Wt Readings from Last 3 Encounters:  08/21/20 122.5 kg (270 lb)  08/16/20 123.2 kg (271 lb 9.6 oz)  08/10/20 119.8 kg (264 lb 1.6 oz)     PHYSICAL EXAM: General:  Well appearing. No resp difficulty HEENT: normal Neck: supple. JVP 5-6 . Carotids 2+ bilaterally; no bruits. No lymphadenopathy or thryomegaly appreciated. Cor: PMI normal. Irregular rate & rhythm. No rubs, gallops or murmurs. Lungs: clear Abdomen: soft, nontender, nondistended. No hepatosplenomegaly. No bruits or masses. Good bowel  sounds. Extremities: no cyanosis, clubbing, rash, R and LLE trace edema. Neuro: alert & orientedx3, cranial nerves grossly intact. Moves all 4 extremities w/o difficulty. Affect pleasant.   ECG:  Af ib 61 bpm    ASSESSMENT & PLAN: 1.Chronic systolic CHF: TEE 9/62 with EF <10%, severe RV dysfunction, LA appendage thrombus. Echoes in the past have had EF ranging 30-45%. Possible tachycardia-mediated cardiomyopathy as he seems to become markedly more symptomatic when in atrial fibrillation though interestingly, HR initially was not elevated (in 80s). Alternatively, he could have another cause for cardiomyopathy and atrial fibrillation just causes him to decompensate.  Echo 08/04/2020 with EF 25-30%,  moderately decreased RV systolic function.  NYHA II. Volume status stable. Change lasix to 40 mg as needed for 3 pound weight gain in 24 hours.    - Continue  Coreg 3.125 mg bid.  - Stop losartan and start entresto 24-26 mg twice a day. Given 30 day free card.  - Continue digoxin 0.125 mg daily, check dig level - Continue  jardiance 10 mg daily.  - Continue spiro 25 mg daily.   -Continue hydralazine100 mg two times a day (he does not want to take three times a day)and Imdur60 mg daily.  - Eventually need to rule out coronary disease as underlying cause for cardiomyopathy (if not ischemic CMP, likely tachy-mediated CMP). Coronary angiography was not donerecent  admission with elevated creatinine.  - Consider cath soon---> will be after a month or so of therapeutic anticoagulation post-DCCV as long as creatinine remains < 2. 2. Atrial fibrillation: Persistent. Ablation planned in 6/20 but had LA thrombus despite Xarelto use. He converted back to NSR spontaneously. He had not been on amiodarone or beta blockade due to reported history of bradycardia. During last admit taken off pradaxa due to LA appendage thrombus again seen on TEE 2/18,  no DCCV therefore. He had another TEE 2/22 and this  showed no LA appendage thrombus on IV heparin gtt + warfarin, so DCCV done with conversion to NSR.  - Today he is back in A fib with controlled rate. Given he does not tolerate A fib we will set up for DC-CV. Will continue amio 200 mg twice a day.  -  With clots despite Xarelto and Pradaxa use, he was startedwarfarin. Will aim for INR 2.5-3. Check INR today.  - Long-term, would keep on amiodarone and aim for AF ablation(follow up with Dr. Rayann Heman).  - He has follow up with Dr Rayann Heman later this month.  3. CKD stage 3: Creatinine 1.8 Check BMET   4. OSA: Continue CPAP nightly.  5. Type 2 diabetes: Continue jardiance 10 mg daily.  6. Elevated LFTs: During recent hospitalization had elevated LFTs. Suspect congestive hepatopathy  Follow up next week for cardioversion. Discussed with Dr Aundra Dubin.  - Stopping losartan and switching to entresto today. Discussed purpose.  - Check BMET, INR, dig level today.  - He will need ECHO the end of May.  Johnye Kist NP-C  12:58 PM

## 2020-08-21 ENCOUNTER — Other Ambulatory Visit: Payer: Self-pay

## 2020-08-21 ENCOUNTER — Ambulatory Visit (HOSPITAL_COMMUNITY)
Admit: 2020-08-21 | Discharge: 2020-08-21 | Disposition: A | Discharge status: Home / Self Care | Payer: Medicare HMO | Attending: Adult Health | Admitting: Adult Health

## 2020-08-21 ENCOUNTER — Encounter (HOSPITAL_COMMUNITY): Payer: Self-pay

## 2020-08-21 ENCOUNTER — Telehealth (HOSPITAL_COMMUNITY): Payer: Self-pay | Admitting: Pharmacy Technician

## 2020-08-21 ENCOUNTER — Other Ambulatory Visit (HOSPITAL_COMMUNITY): Payer: Self-pay | Admitting: *Deleted

## 2020-08-21 VITALS — BP 110/88 | HR 79 | Wt 270.0 lb

## 2020-08-21 DIAGNOSIS — Z79899 Other long term (current) drug therapy: Secondary | ICD-10-CM | POA: Insufficient documentation

## 2020-08-21 DIAGNOSIS — I4819 Other persistent atrial fibrillation: Secondary | ICD-10-CM | POA: Insufficient documentation

## 2020-08-21 DIAGNOSIS — Z7951 Long term (current) use of inhaled steroids: Secondary | ICD-10-CM | POA: Insufficient documentation

## 2020-08-21 DIAGNOSIS — E1136 Type 2 diabetes mellitus with diabetic cataract: Secondary | ICD-10-CM | POA: Diagnosis not present

## 2020-08-21 DIAGNOSIS — Z79890 Hormone replacement therapy (postmenopausal): Secondary | ICD-10-CM | POA: Diagnosis not present

## 2020-08-21 DIAGNOSIS — N1831 Chronic kidney disease, stage 3a: Secondary | ICD-10-CM

## 2020-08-21 DIAGNOSIS — R7989 Other specified abnormal findings of blood chemistry: Secondary | ICD-10-CM | POA: Diagnosis not present

## 2020-08-21 DIAGNOSIS — I513 Intracardiac thrombosis, not elsewhere classified: Secondary | ICD-10-CM | POA: Diagnosis not present

## 2020-08-21 DIAGNOSIS — Z7984 Long term (current) use of oral hypoglycemic drugs: Secondary | ICD-10-CM | POA: Diagnosis not present

## 2020-08-21 DIAGNOSIS — I5022 Chronic systolic (congestive) heart failure: Secondary | ICD-10-CM | POA: Diagnosis not present

## 2020-08-21 DIAGNOSIS — I130 Hypertensive heart and chronic kidney disease with heart failure and stage 1 through stage 4 chronic kidney disease, or unspecified chronic kidney disease: Secondary | ICD-10-CM | POA: Insufficient documentation

## 2020-08-21 DIAGNOSIS — Z8616 Personal history of COVID-19: Secondary | ICD-10-CM | POA: Diagnosis not present

## 2020-08-21 DIAGNOSIS — I4891 Unspecified atrial fibrillation: Secondary | ICD-10-CM

## 2020-08-21 DIAGNOSIS — Z7901 Long term (current) use of anticoagulants: Secondary | ICD-10-CM | POA: Insufficient documentation

## 2020-08-21 DIAGNOSIS — Z794 Long term (current) use of insulin: Secondary | ICD-10-CM | POA: Insufficient documentation

## 2020-08-21 DIAGNOSIS — Z9989 Dependence on other enabling machines and devices: Secondary | ICD-10-CM | POA: Insufficient documentation

## 2020-08-21 DIAGNOSIS — Z8249 Family history of ischemic heart disease and other diseases of the circulatory system: Secondary | ICD-10-CM | POA: Diagnosis not present

## 2020-08-21 DIAGNOSIS — E1122 Type 2 diabetes mellitus with diabetic chronic kidney disease: Secondary | ICD-10-CM | POA: Insufficient documentation

## 2020-08-21 DIAGNOSIS — G4733 Obstructive sleep apnea (adult) (pediatric): Secondary | ICD-10-CM | POA: Diagnosis not present

## 2020-08-21 DIAGNOSIS — N183 Chronic kidney disease, stage 3 unspecified: Secondary | ICD-10-CM | POA: Insufficient documentation

## 2020-08-21 LAB — DIGOXIN LEVEL: Digoxin Level: 0.9 ng/mL (ref 0.8–2.0)

## 2020-08-21 LAB — BASIC METABOLIC PANEL
Anion gap: 10 (ref 5–15)
BUN: 20 mg/dL (ref 8–23)
CO2: 25 mmol/L (ref 22–32)
Calcium: 9.5 mg/dL (ref 8.9–10.3)
Chloride: 103 mmol/L (ref 98–111)
Creatinine, Ser: 1.52 mg/dL — ABNORMAL HIGH (ref 0.61–1.24)
GFR, Estimated: 51 mL/min — ABNORMAL LOW (ref >60)
Glucose, Bld: 74 mg/dL (ref 70–99)
Potassium: 4.3 mmol/L (ref 3.5–5.1)
Sodium: 138 mmol/L (ref 135–145)

## 2020-08-21 LAB — PROTIME-INR
INR: 2.4 — ABNORMAL HIGH (ref 0.8–1.2)
Prothrombin Time: 25.2 seconds — ABNORMAL HIGH (ref 11.4–15.2)

## 2020-08-21 MED ORDER — FUROSEMIDE 40 MG PO TABS: 40.0000 mg | ORAL_TABLET | Freq: Every day | ORAL | 5 refills | Status: DC | PRN

## 2020-08-21 MED ORDER — ENTRESTO 24-26 MG PO TABS: 1.0000 | ORAL_TABLET | Freq: Two times a day (BID) | ORAL | 3 refills | Status: DC

## 2020-08-21 NOTE — Patient Instructions (Signed)
Continue Amiodarone 200 mg Twice daily   Stop Losartan  Start Entresto 24/26 mg Twice daily   Take Furosemide ONLY AS NEEDED for weight gain of 3 lbs overnight or 5 lbs in a week  Labs done today, your results will be available in MyChart, we will contact you for abnormal readings.  Your physician has recommended that you have a Cardioversion (DCCV). Electrical Cardioversion uses a jolt of electricity to your heart either through paddles or wired patches attached to your chest. This is a controlled, usually prescheduled, procedure. Defibrillation is done under light anesthesia in the hospital, and you usually go home the day of the procedure. This is done to get your heart back into a normal rhythm. You are not awake for the procedure. Please see the instruction sheet given to you today. SEE INSTRUCTIONS BELOW  Keep follow up as scheduled with Dr Rayann Heman on 09/05/20  Keep follow up as scheduled with Dr Harrell Gave on 09/17/20  Your physician recommends that you schedule a follow-up appointment in: 2 months with an echocardiogram  If you have any questions or concerns before your next appointment please send [place] a message through [facility] or call our office at [phone].    TO LEAVE A MESSAGE FOR THE NURSE SELECT OPTION 2, PLEASE LEAVE A MESSAGE INCLUDING: . YOUR NAME . DATE OF BIRTH . CALL BACK NUMBER . REASON FOR CALL**this is important as we prioritize the call backs  [facility] AS LONG AS YOU CALL BEFORE 4:00 PM  At the [facility], you and your health needs are our priority. As part of our continuing mission to provide you with exceptional heart care, we have created designated Provider Care Teams. These Care Teams include your primary Cardiologist (physician) and Advanced Practice Providers (APPs- Physician Assistants and Nurse Practitioners) who all work together to provide you with the care you need, when you need it.   You may see  any of the following providers on your designated Care Team at your next follow up: [name] Dr Glori Bickers . Dr Loralie Champagne . Dr Vickki Muff . Darrick Grinder, NP . Lyda Jester, [place] . Audry Riles, PharmD   Please be sure to bring in all your medications bottles to every appointment.    CARDIOVERSION INSTRUCTIONS  You are scheduled for a Cardioversion on Wed 08/29/20 with Dr. Haroldine Laws.  Please arrive at the [facility] (Main Entrance A) at [facility]: [address] [place], [place] [address] at 12:30 pm.  DIET: Nothing to eat or drink after midnight except a sip of water with medications (see medication instructions below)  Medication Instructions: Hold Jardiance, Furosemide, Spironolactone, and Insulin Wed 3/16 AM  Continue your anticoagulant: Coumadin, please do not miss any doses  COVID TEST: Monday 08/27/20 at:   [facility] in [place] must have a responsible person to drive you home and stay in the waiting area during your procedure. Failure to do so could result in cancellation.  Bring your insurance cards.  *Special Note: Every effort is made to have your procedure done on time. Occasionally there are emergencies that occur at the hospital that may cause delays. Please be patient if a delay does occur.

## 2020-08-21 NOTE — Telephone Encounter (Signed)
Patient was seen in clinic today and started on Entresto. His current 30 day co-pay is, $166.91. He may already be in the coverage gap.  Started an [place] for [employer] assistance. Will fax in once signatures are obtained.  Patient is currently pending [employer] approval for Jardiance. The company requested that he provide POI. I spoke with the patient about this today. He is going to bring in POI for [place] to send. Provided him with a month of Jardiance samples.  LOT: [id number] EXP: 09/23

## 2020-08-22 NOTE — Telephone Encounter (Signed)
Sent in [employer] application via fax.  Will follow up.  

## 2020-08-24 ENCOUNTER — Telehealth (HOSPITAL_COMMUNITY): Payer: Self-pay | Admitting: Cardiology

## 2020-08-24 ENCOUNTER — Other Ambulatory Visit: Payer: Self-pay

## 2020-08-24 ENCOUNTER — Ambulatory Visit (INDEPENDENT_AMBULATORY_CARE_PROVIDER_SITE_OTHER): Payer: Medicare HMO | Admitting: *Deleted

## 2020-08-24 DIAGNOSIS — I513 Intracardiac thrombosis, not elsewhere classified: Secondary | ICD-10-CM

## 2020-08-24 DIAGNOSIS — I4891 Unspecified atrial fibrillation: Secondary | ICD-10-CM | POA: Diagnosis not present

## 2020-08-24 DIAGNOSIS — Z5181 Encounter for therapeutic drug level monitoring: Secondary | ICD-10-CM | POA: Diagnosis not present

## 2020-08-24 LAB — POCT INR: INR: 3.4 — AB (ref 2.0–3.0)

## 2020-08-24 MED ORDER — DIGOXIN 125 MCG PO TABS: 0.0625 mg | ORAL_TABLET | Freq: Every day | ORAL | 5 refills | Status: DC

## 2020-08-24 NOTE — Telephone Encounter (Signed)
-----   Message from Conrad [place], NP sent at 08/21/2020  6:20 PM EST ----- Dig level high. Please call. Ask him to take half of his digoxin daily. [facility] to [facility]. Thanks Amy

## 2020-08-24 NOTE — Telephone Encounter (Signed)
Pt aware and voiced understanding 

## 2020-08-24 NOTE — Patient Instructions (Addendum)
Description   Start taking warfarin 1.5 tablets  daily except for 1 tablet on Mondays and Fridays. Recheck INR in 1 week. [facility] [phone].

## 2020-08-27 ENCOUNTER — Other Ambulatory Visit (HOSPITAL_COMMUNITY)
Admission: RE | Admit: 2020-08-27 | Discharge: 2020-08-27 | Disposition: A | Discharge status: Home / Self Care | Payer: Medicare HMO | Source: Ambulatory Visit | Attending: Internal Medicine | Admitting: Internal Medicine

## 2020-08-27 DIAGNOSIS — Z20822 Contact with and (suspected) exposure to covid-19: Secondary | ICD-10-CM | POA: Diagnosis not present

## 2020-08-27 DIAGNOSIS — Z01812 Encounter for preprocedural laboratory examination: Secondary | ICD-10-CM | POA: Diagnosis not present

## 2020-08-27 NOTE — Telephone Encounter (Signed)
Called [employer] to check the status of the patient's application. Representative stated that the patient was in the benefits portion of the investigation. Will follow up.

## 2020-08-28 LAB — SARS CORONAVIRUS 2 (TAT 6-24 HRS): SARS Coronavirus 2: NEGATIVE

## 2020-08-29 ENCOUNTER — Encounter (HOSPITAL_COMMUNITY): Payer: Self-pay | Admitting: Internal Medicine

## 2020-08-29 ENCOUNTER — Encounter (HOSPITAL_COMMUNITY)
Admission: RE | Disposition: A | Discharge status: Home / Self Care | Payer: Self-pay | Source: Home / Self Care | Attending: Internal Medicine

## 2020-08-29 ENCOUNTER — Ambulatory Visit (HOSPITAL_COMMUNITY): Payer: Medicare HMO | Admitting: Anesthesiology

## 2020-08-29 ENCOUNTER — Ambulatory Visit (HOSPITAL_COMMUNITY)
Admission: RE | Admit: 2020-08-29 | Discharge: 2020-08-29 | Disposition: A | Discharge status: Home / Self Care | Payer: Medicare HMO | Attending: Internal Medicine | Admitting: Internal Medicine

## 2020-08-29 ENCOUNTER — Other Ambulatory Visit: Payer: Self-pay

## 2020-08-29 DIAGNOSIS — N183 Chronic kidney disease, stage 3 unspecified: Secondary | ICD-10-CM | POA: Insufficient documentation

## 2020-08-29 DIAGNOSIS — Z79899 Other long term (current) drug therapy: Secondary | ICD-10-CM | POA: Insufficient documentation

## 2020-08-29 DIAGNOSIS — G4733 Obstructive sleep apnea (adult) (pediatric): Secondary | ICD-10-CM | POA: Insufficient documentation

## 2020-08-29 DIAGNOSIS — I5023 Acute on chronic systolic (congestive) heart failure: Secondary | ICD-10-CM | POA: Diagnosis not present

## 2020-08-29 DIAGNOSIS — Z794 Long term (current) use of insulin: Secondary | ICD-10-CM | POA: Insufficient documentation

## 2020-08-29 DIAGNOSIS — E039 Hypothyroidism, unspecified: Secondary | ICD-10-CM | POA: Diagnosis not present

## 2020-08-29 DIAGNOSIS — I130 Hypertensive heart and chronic kidney disease with heart failure and stage 1 through stage 4 chronic kidney disease, or unspecified chronic kidney disease: Secondary | ICD-10-CM | POA: Insufficient documentation

## 2020-08-29 DIAGNOSIS — I5022 Chronic systolic (congestive) heart failure: Secondary | ICD-10-CM | POA: Diagnosis not present

## 2020-08-29 DIAGNOSIS — Z79890 Hormone replacement therapy (postmenopausal): Secondary | ICD-10-CM | POA: Insufficient documentation

## 2020-08-29 DIAGNOSIS — I4891 Unspecified atrial fibrillation: Secondary | ICD-10-CM

## 2020-08-29 DIAGNOSIS — E1122 Type 2 diabetes mellitus with diabetic chronic kidney disease: Secondary | ICD-10-CM | POA: Insufficient documentation

## 2020-08-29 DIAGNOSIS — I4819 Other persistent atrial fibrillation: Secondary | ICD-10-CM | POA: Insufficient documentation

## 2020-08-29 DIAGNOSIS — I110 Hypertensive heart disease with heart failure: Secondary | ICD-10-CM | POA: Diagnosis not present

## 2020-08-29 HISTORY — PX: CARDIOVERSION: SHX1299

## 2020-08-29 LAB — PROTIME-INR
INR: 2.8 — ABNORMAL HIGH (ref 0.8–1.2)
Prothrombin Time: 28.9 seconds — ABNORMAL HIGH (ref 11.4–15.2)

## 2020-08-29 SURGERY — CARDIOVERSION
Anesthesia: General

## 2020-08-29 MED ORDER — PROPOFOL 10 MG/ML IV BOLUS
INTRAVENOUS | Status: DC | PRN
  Administered 2020-08-29 (×3): 20 mg via INTRAVENOUS
  Administered 2020-08-29: 10 mg via INTRAVENOUS
  Administered 2020-08-29: 50 mg via INTRAVENOUS

## 2020-08-29 MED ORDER — SODIUM CHLORIDE 0.9 % IV SOLN: INTRAVENOUS | Status: DC | PRN

## 2020-08-29 MED ORDER — LIDOCAINE 2% (20 MG/ML) 5 ML SYRINGE
INTRAMUSCULAR | Status: DC | PRN
  Administered 2020-08-29: 100 mg via INTRAVENOUS

## 2020-08-29 MED ORDER — SODIUM CHLORIDE 0.9 % IV SOLN: INTRAVENOUS | Status: DC

## 2020-08-29 NOTE — Discharge Instructions (Signed)
YOU HAD AN CARDIAC PROCEDURE TODAY: Refer to the procedure report and other information in the discharge instructions given to you for any specific questions about what was found during the examination. If this information does not answer your questions, please call [facility] office at [phone] to clarify.   DIET: Your first meal following the procedure should be a light meal and then it is ok to progress to your normal diet. A half-sandwich or bowl of soup is an example of a good first meal. Heavy or fried foods are harder to digest and may make you feel nauseous or bloated. Drink plenty of fluids but you should avoid alcoholic beverages for 24 hours. If you had a esophageal dilation, please see attached instructions for diet.   ACTIVITY: Your care partner should take you home directly after the procedure. You should plan to take it easy, moving slowly for the rest of the day. You can resume normal activity the day after the procedure however YOU SHOULD NOT DRIVE, use power tools, machinery or perform tasks that involve climbing or major physical exertion for 24 hours (because of the sedation medicines used during the test).   SYMPTOMS TO REPORT IMMEDIATELY: A cardiologist can be reached at any hour. Please call [phone] for any of the following symptoms:    New, significant abdominal pain  New, significant chest pain or pain under the shoulder blades   New shortness of breath    FOLLOW UP:  Please also call with any specific questions about appointments or follow up tests.  Electrical Cardioversion Electrical cardioversion is the delivery of a jolt of electricity to restore a normal rhythm to the heart. A rhythm that is too fast or is not regular keeps the heart from pumping well. In this procedure, sticky patches or metal paddles are placed on the chest to deliver electricity to the heart from a device. This procedure may be done in an emergency if:  There is low or no blood  pressure as a result of the heart rhythm.  Normal rhythm must be restored as fast as possible to protect the brain and heart from further damage.  It may save a life. This may also be a scheduled procedure for irregular or fast heart rhythms that are not immediately life-threatening. Tell a health care provider about:  Any allergies you have.  All medicines you are taking, including vitamins, herbs, eye drops, creams, and over-the-counter medicines.  Any problems you or family members have had with anesthetic medicines.  Any blood disorders you have.  Any surgeries you have had.  Any medical conditions you have.  Whether you are pregnant or may be pregnant. What are the risks? Generally, this is a safe procedure. However, problems may occur, including:  Allergic reactions to medicines.  A blood clot that breaks free and travels to other parts of your body.  The possible return of an abnormal heart rhythm within hours or days after the procedure.  Your heart stopping (cardiac arrest). This is rare. What happens before the procedure? Medicines  Your health care provider may have you start taking: ? Blood-thinning medicines (anticoagulants) so your blood does not clot as easily. ? Medicines to help stabilize your heart rate and rhythm.  Ask your health care provider about: ? Changing or stopping your regular medicines. This is especially important if you are taking diabetes medicines or blood thinners. ? Taking medicines such as aspirin and ibuprofen. These medicines can thin your blood. Do not take  these medicines unless your health care provider tells you to take them. ? Taking over-the-counter medicines, vitamins, herbs, and supplements. General instructions  Follow instructions from your health care provider about eating or drinking restrictions.  Plan to have someone take you home from the hospital or clinic.  If you will be going home right after the procedure, plan  to have someone with you for 24 hours.  Ask your health care provider what steps will be taken to help prevent infection. These may include washing your skin with a germ-killing soap. What happens during the procedure?  An IV will be inserted into one of your veins.  Sticky patches (electrodes) or metal paddles may be placed on your chest.  You will be given a medicine to help you relax (sedative).  An electrical shock will be delivered. The procedure may vary among health care providers and hospitals.   What can I expect after the procedure?  Your blood pressure, heart rate, breathing rate, and blood oxygen level will be monitored until you leave the hospital or clinic.  Your heart rhythm will be watched to make sure it does not change.  You may have some redness on the skin where the shocks were given. Follow these instructions at home:  Do not drive for 24 hours if you were given a sedative during your procedure.  Take over-the-counter and prescription medicines only as told by your health care provider.  Ask your health care provider how to check your pulse. Check it often.  Rest for 48 hours after the procedure or as told by your health care provider.  Avoid or limit your caffeine use as told by your health care provider.  Keep all follow-up visits as told by your health care provider. This is important. Contact a health care provider if:  You feel like your heart is beating too quickly or your pulse is not regular.  You have a serious muscle cramp that does not go away. Get help right away if:  You have discomfort in your chest.  You are dizzy or you feel faint.  You have trouble breathing or you are short of breath.  Your speech is slurred.  You have trouble moving an arm or leg on one side of your body.  Your fingers or toes turn cold or blue. Summary  Electrical cardioversion is the delivery of a jolt of electricity to restore a normal rhythm to the  heart.  This procedure may be done right away in an emergency or may be a scheduled procedure if the condition is not an emergency.  Generally, this is a safe procedure.  After the procedure, check your pulse often as told by your health care provider. This information is not intended to replace advice given to you by your health care provider. Make sure you discuss any questions you have with your health care provider. Document Revised: 01/03/2019 Document Reviewed: 01/03/2019 Elsevier Patient Education  [place].

## 2020-08-29 NOTE — CV Procedure (Signed)
    DIRECT CURRENT CARDIOVERSION  NAME:  [name]   MRN: [id number] DOB:  25-Feb-1956   ADMIT DATE: 08/29/2020   INDICATIONS: Atrial fibrillation    PROCEDURE:   Informed consent was obtained prior to the procedure. The risks, benefits and alternatives for the procedure were discussed and the patient comprehended these risks. Once an appropriate time out was taken, the patient had the defibrillator pads placed in the anterior and posterior position. The patient then underwent sedation by the anesthesia service. Once an appropriate level of sedation was achieved, the patient received a single biphasic, synchronized 200J shock with prompt conversion to sinus rhythm. No apparent complications.  Glori Bickers, MD  2:08 PM

## 2020-08-29 NOTE — Interval H&P Note (Signed)
History and Physical Interval Note:  08/29/2020 2:08 PM  [name]  has presented today for surgery, with the diagnosis of AFIB.  The various methods of treatment have been discussed with the patient and family. After consideration of risks, benefits and other options for treatment, the patient has consented to  Procedure(s): CARDIOVERSION (N/A) as a surgical intervention.  The patient's history has been reviewed, patient examined, no change in status, stable for surgery.  I have reviewed the patient's chart and labs.  Questions were answered to the patient's satisfaction.     [name]

## 2020-08-29 NOTE — Anesthesia Procedure Notes (Signed)
Procedure Name: General with mask airway Date/Time: 08/29/2020 2:00 PM Performed by: Renato Shin, CRNA Pre-anesthesia Checklist: Patient identified, Emergency Drugs available, Suction available and Patient being monitored Patient Re-evaluated:Patient Re-evaluated prior to induction Oxygen Delivery Method: Ambu bag Preoxygenation: Pre-oxygenation with 100% oxygen Induction Type: IV induction Ventilation: Mask ventilation without difficulty Placement Confirmation: positive ETCO2 and breath sounds checked- equal and bilateral Dental Injury: Teeth and Oropharynx as per pre-operative assessment

## 2020-08-29 NOTE — Transfer of Care (Signed)
Immediate Anesthesia Transfer of Care Note  Patient: [name]  Procedure(s) Performed: CARDIOVERSION (N/A )  Patient Location: PACU  Anesthesia Type:General  Level of Consciousness: awake and patient cooperative  Airway & Oxygen Therapy: Patient Spontanous Breathing and Patient connected to nasal cannula oxygen  Post-op Assessment: Report given to RN and Post -op Vital signs reviewed and stable  Post vital signs: Reviewed and stable  Last Vitals:  Vitals Value Taken Time  BP    Temp    Pulse    Resp    SpO2      Last Pain:  Vitals:   08/29/20 1255  TempSrc: Oral  PainSc: 0-No pain         Complications: No complications documented.

## 2020-08-29 NOTE — Anesthesia Preprocedure Evaluation (Addendum)
Anesthesia Evaluation  Patient identified by MRN, date of birth, ID band Patient awake    Reviewed: Allergy & Precautions, H&P , NPO status , Patient's Chart, lab work & pertinent test results, reviewed documented beta blocker date and time   Airway Mallampati: II   Neck ROM: full    Dental  (+) Dental Advisory Given, Partial Upper   Pulmonary asthma , sleep apnea and Continuous Positive Airway Pressure Ventilation ,    breath sounds clear to auscultation       Cardiovascular hypertension, Pt. on medications and Pt. on home beta blockers +CHF  + dysrhythmias Atrial Fibrillation  Rhythm:irregular Rate:Abnormal     Neuro/Psych negative neurological ROS  negative psych ROS   GI/Hepatic   Endo/Other  diabetes, Type 2, Insulin Dependent, Oral Hypoglycemic AgentsHypothyroidism Obesity   Renal/GU      Musculoskeletal   Abdominal   Peds  Hematology  (+) Blood dyscrasia (Coumadin), ,   Anesthesia Other Findings   Reproductive/Obstetrics                           Anesthesia Physical Anesthesia Plan  ASA: III  Anesthesia Plan: General   Post-op Pain Management:    Induction: Intravenous  PONV Risk Score and Plan: 2 and Treatment may vary due to age or medical condition and Propofol infusion  Airway Management Planned: Mask  Additional Equipment:   Intra-op Plan:   Post-operative Plan:   Informed Consent: I have reviewed the patients History and Physical, chart, labs and discussed the procedure including the risks, benefits and alternatives for the proposed anesthesia with the patient or authorized representative who has indicated his/her understanding and acceptance.     Dental advisory given  Plan Discussed with: CRNA, Anesthesiologist and Surgeon  Anesthesia Plan Comments:         Anesthesia Quick Evaluation

## 2020-08-30 ENCOUNTER — Encounter (HOSPITAL_COMMUNITY): Payer: Self-pay

## 2020-08-30 NOTE — Anesthesia Postprocedure Evaluation (Signed)
Anesthesia Post Note  Patient: [name]  Procedure(s) Performed: CARDIOVERSION (N/A )     Patient location during evaluation: Endoscopy Anesthesia Type: General Level of consciousness: awake and alert Pain management: pain level controlled Vital Signs Assessment: post-procedure vital signs reviewed and stable Respiratory status: spontaneous breathing, nonlabored ventilation, respiratory function stable and patient connected to nasal cannula oxygen Cardiovascular status: blood pressure returned to baseline and stable Postop Assessment: no apparent nausea or vomiting Anesthetic complications: no   No complications documented.  Last Vitals:  Vitals:   08/29/20 1436 08/29/20 1440  BP: 125/82   Pulse: 61 62  Resp: 14 17  Temp:    SpO2: 98% 98%    Last Pain:  Vitals:   08/30/20 1006  TempSrc:   PainSc: 0-No pain                 Catalina Gravel

## 2020-08-30 NOTE — Telephone Encounter (Signed)
Advanced Heart Failure Patient Advocate Encounter   Patient was approved to receive Jardiance from [place]  Effective dates: 08/30/20 through 06/15/21

## 2020-08-31 ENCOUNTER — Other Ambulatory Visit: Payer: Self-pay | Admitting: *Deleted

## 2020-08-31 ENCOUNTER — Ambulatory Visit (INDEPENDENT_AMBULATORY_CARE_PROVIDER_SITE_OTHER): Payer: Medicare HMO | Admitting: *Deleted

## 2020-08-31 ENCOUNTER — Other Ambulatory Visit: Payer: Self-pay

## 2020-08-31 ENCOUNTER — Encounter (HOSPITAL_COMMUNITY): Payer: Self-pay | Admitting: Internal Medicine

## 2020-08-31 DIAGNOSIS — I4891 Unspecified atrial fibrillation: Secondary | ICD-10-CM

## 2020-08-31 DIAGNOSIS — I513 Intracardiac thrombosis, not elsewhere classified: Secondary | ICD-10-CM

## 2020-08-31 DIAGNOSIS — Z5181 Encounter for therapeutic drug level monitoring: Secondary | ICD-10-CM

## 2020-08-31 LAB — POCT INR: INR: 3.8 — AB (ref 2.0–3.0)

## 2020-08-31 MED ORDER — WARFARIN SODIUM 5 MG PO TABS: ORAL_TABLET | ORAL | 0 refills | Status: DC

## 2020-08-31 NOTE — Telephone Encounter (Signed)
Called and spoke with patient regarding [employer] C[employer] approval. Patient stated that he spoke with [employer] earlier this week, they are also requesting POI. That documentation was sent in via fax.  Will follow up.

## 2020-08-31 NOTE — Telephone Encounter (Signed)
  Lov: 08/21/2020 Next INR check: 3/23 Warfarin tablet strength: 5mg

## 2020-08-31 NOTE — Patient Instructions (Signed)
Description   Hold warfarin today and then start taking 1.5 tablets of warfarin daily except for 1 tablet on Mondays, Wednesday and Fridays. Recheck INR in 1 week. [facility] [phone].

## 2020-09-05 ENCOUNTER — Ambulatory Visit: Payer: Medicare HMO | Admitting: Internal Medicine

## 2020-09-05 ENCOUNTER — Encounter: Payer: Self-pay | Admitting: Internal Medicine

## 2020-09-05 ENCOUNTER — Other Ambulatory Visit: Payer: Self-pay

## 2020-09-05 ENCOUNTER — Encounter: Payer: Self-pay | Admitting: *Deleted

## 2020-09-05 ENCOUNTER — Ambulatory Visit (INDEPENDENT_AMBULATORY_CARE_PROVIDER_SITE_OTHER): Payer: Medicare HMO

## 2020-09-05 VITALS — BP 116/74 | HR 81 | Ht 73.0 in | Wt 270.4 lb

## 2020-09-05 DIAGNOSIS — F1010 Alcohol abuse, uncomplicated: Secondary | ICD-10-CM

## 2020-09-05 DIAGNOSIS — I4819 Other persistent atrial fibrillation: Secondary | ICD-10-CM

## 2020-09-05 DIAGNOSIS — G4730 Sleep apnea, unspecified: Secondary | ICD-10-CM | POA: Diagnosis not present

## 2020-09-05 DIAGNOSIS — G4733 Obstructive sleep apnea (adult) (pediatric): Secondary | ICD-10-CM

## 2020-09-05 DIAGNOSIS — I513 Intracardiac thrombosis, not elsewhere classified: Secondary | ICD-10-CM

## 2020-09-05 DIAGNOSIS — I4891 Unspecified atrial fibrillation: Secondary | ICD-10-CM

## 2020-09-05 DIAGNOSIS — I5022 Chronic systolic (congestive) heart failure: Secondary | ICD-10-CM | POA: Diagnosis not present

## 2020-09-05 DIAGNOSIS — I428 Other cardiomyopathies: Secondary | ICD-10-CM | POA: Diagnosis not present

## 2020-09-05 LAB — POCT INR: INR: 2.8 (ref 2.0–3.0)

## 2020-09-05 NOTE — Patient Instructions (Signed)
Description   Continue on same dosage 1.5 tablets of warfarin daily except for 1 tablet on Mondays, Wednesdays and Fridays. Recheck INR in 2 weeks. Pt had Cardioversion on 3/16, Pt is on amio 200mg  daily. [facility]in Clinic [phone].

## 2020-09-05 NOTE — Progress Notes (Signed)
PCP: [name], MD Primary Cardiologist: Dr Harrell Gave Primary EP: Dr Rayann Heman  [name] is a 65 y.o. male who presents today for routine electrophysiology followup.  Since last being seen in our clinic, the patient reports doing reasonably well.  His CHF has advanced.   He was hospitalized in February with 30 lb diuresis.  He had AF with RVR on presentation.  He required cardioversion.  He has since returned to afib but is unaware.  Today, he denies symptoms of palpitations, chest pain, shortness of breath,  lower extremity edema, dizziness, presyncope, or syncope.  The patient is otherwise without complaint today.   Past Medical History:  Diagnosis Date  . Asthma    as a teenager - does not use an inhaler although pt said he has an albuteral inhaler  . Atrial fibrillation ([place])    persistant 02/2009  . Atrial flutter ([place])    s/p CTI ablation 08/06/10  . Cataract   . Chronic rhinitis   . Colon polyp   . Congestive heart failure ([place])   . Diverticulosis    colonoscopy 04/03/2009  . Hyperlipidemia   . Hypertension   . Morbid obesity ([place])    target weight = 219  for BMI < 30  . Nonischemic cardiomyopathy ([facility])    tachycardia mediated  . Seasonal allergies   . Sleep apnea    original 2001 - wears C-PaP  . Type 2 diabetes mellitus ([place])    Past Surgical History:  Procedure Laterality Date  . ATRIAL FIBRILLATION ABLATION N/A 12/14/2018   Procedure: ATRIAL FIBRILLATION ABLATION;  Surgeon: Thompson Grayer, MD;  Location: [place] [facility];  Service: Cardiovascular;  Laterality: N/A;  . atrial flutter ablation  08/06/10  . CARDIOVERSION N/A 07/05/2018   Procedure: CARDIOVERSION;  Surgeon: Buford Dresser, MD;  Location: [facility] ENDOSCOPY;  Service: Cardiovascular;  Laterality: N/A;  . CARDIOVERSION N/A 08/08/2020   Procedure: CARDIOVERSION;  Surgeon: Larey Dresser, MD;  Location: [facility] ENDOSCOPY;  Service: Cardiovascular;  Laterality: N/A;  . CARDIOVERSION N/A  08/29/2020   Procedure: CARDIOVERSION;  Surgeon: Jolaine Artist, MD;  Location: [facility] ENDOSCOPY;  Service: Cardiovascular;  Laterality: N/A;  . COLONOSCOPY    . POLYPECTOMY    . RIGHT HEART CATH N/A 08/06/2020   Procedure: RIGHT HEART CATH;  Surgeon: Larey Dresser, MD;  Location: [place] [facility];  Service: Cardiovascular;  Laterality: N/A;  . TEE WITHOUT CARDIOVERSION N/A 12/14/2018   Procedure: TRANSESOPHAGEAL ECHOCARDIOGRAM (TEE);  Surgeon: Thompson Grayer, MD;  Location: [place] [facility];  Service: Cardiovascular;  Laterality: N/A;  . TEE WITHOUT CARDIOVERSION N/A 08/03/2020   Procedure: TRANSESOPHAGEAL ECHOCARDIOGRAM (TEE);  Surgeon: Sanda Klein, MD;  Location: [place];  Service: Cardiovascular;  Laterality: N/A;  . TEE WITHOUT CARDIOVERSION N/A 08/08/2020   Procedure: TRANSESOPHAGEAL ECHOCARDIOGRAM (TEE);  Surgeon: Larey Dresser, MD;  Location: [facility] ENDOSCOPY;  Service: Cardiovascular;  Laterality: N/A;    ROS- all systems are reviewed and negatives except as per HPI above  Current Outpatient Medications  Medication Sig Dispense Refill  . albuterol (PROVENTIL HFA;VENTOLIN HFA) 108 (90 Base) MCG/ACT inhaler Inhale 2 puffs into the lungs every 6 (six) hours as needed for wheezing or shortness of breath.    [name] amiodarone (PACERONE) 200 MG tablet Take 1 tablet 2 times a day for 2 weeks then reduce to 1 tablet daily 60 tablet 1  . B-D UF III MINI PEN NEEDLES 31G X 5 MM MISC 1 EACH BY OTHER ROUTE  4 (FOUR) TIMES DAILY. E11.9 100 each 4  . carvedilol (COREG) 3.125 MG tablet Take 1 tablet (3.125 mg total) by mouth 2 (two) times daily with a meal. 60 tablet 5  . Continuous Blood Gluc Receiver (FREESTYLE LIBRE 14 DAY READER) DEVI USE AS DIRECTED 1 each 11  . Continuous Blood Gluc Sensor (FREESTYLE LIBRE 14 DAY SENSOR) MISC Use as directed by Diabetes educator 2 each 11  . digoxin (LANOXIN) 0.125 MG tablet Take 0.5 tablets (0.0625 mg total) by mouth daily. 15 tablet 5  . doxylamine,  Sleep, (UNISOM) 25 MG tablet Take 50 mg by mouth at bedtime as needed for sleep.     . empagliflozin (JARDIANCE) 10 MG TABS tablet Take 1 tablet (10 mg total) by mouth daily. 30 tablet 5  . furosemide (LASIX) 40 MG tablet Take 1 tablet (40 mg total) by mouth daily as needed. 30 tablet 5  . hydrALAZINE (APRESOLINE) 50 MG tablet Take 50 mg by mouth 2 (two) times daily.    . insulin aspart (NOVOLOG) 100 UNIT/ML FlexPen Inject 8-12 Units into the skin 3 (three) times daily with meals. 30 mL 3  . insulin degludec (TRESIBA FLEXTOUCH) 200 UNIT/ML FlexTouch Pen Inject 40 Units into the skin daily. 9 mL 3  . isosorbide mononitrate (IMDUR) 60 MG 24 hr tablet Take 1 tablet (60 mg total) by mouth daily. 30 tablet 5  . levothyroxine (SYNTHROID) 50 MCG tablet TAKE 1 TABLET BY MOUTH EVERY DAY BEFORE BREAKFAST 90 tablet 0  . mometasone (NASONEX) 50 MCG/ACT nasal spray Place 2 sprays into the nose daily as needed (allergies or rhinitis).    . Multiple Vitamins-Minerals (MULTIVITAMIN WITH MINERALS) tablet Take 2 tablets by mouth at bedtime. Chewable    . OZEMPIC, 0.25 OR 0.5 MG/DOSE, 2 MG/1.5ML SOPN INJECT 0.5 MG INTO THE SKIN ONCE A WEEK. 1.5 mL 5  . sacubitril-valsartan (ENTRESTO) 24-26 MG Take 1 tablet by mouth 2 (two) times daily. 60 tablet 3  . spironolactone (ALDACTONE) 25 MG tablet Take 1 tablet (25 mg total) by mouth daily. 30 tablet 5  . SYMBICORT 80-4.5 MCG/ACT inhaler USE 2 INHALATIONS TWICE A DAY 30.6 g 1  . thiamine 100 MG tablet Take 1 tablet (100 mg total) by mouth daily. 30 tablet 1  . vitamin C (ASCORBIC ACID) 500 MG tablet Take 500 mg by mouth in the morning.    . warfarin (COUMADIN) 5 MG tablet Take 1 tablet to 1 and 1/2 tablets by mouth daily as directed by the coumadin clinic. 50 tablet 0   No current facility-administered medications for this visit.    Physical Exam: Vitals:   09/05/20 1440  BP: 116/74  Pulse: 81  SpO2: 99%  Weight: 270 lb 6.4 oz (122.7 kg)  Height: 6\' 1"  (1.854 m)     GEN- The patient is well appearing, alert and oriented x 3 today.   Head- normocephalic, atraumatic Eyes-  Sclera clear, conjunctiva pink Ears- hearing intact Oropharynx- clear Lungs-  normal work of breathing Heart- irregular rate and rhythm  GI- soft  Extremities- no clubbing, cyanosis, or edema  Wt Readings from Last 3 Encounters:  09/05/20 270 lb 6.4 oz (122.7 kg)  08/29/20 265 lb (120.2 kg)  08/21/20 270 lb (122.5 kg)   Echo 08/04/20- EF 30-35%, severe AL enlargement (volume 230ml) 5.7cm  EKG tracing ordered today is personally reviewed and shows afib  Assessment and Plan:  1. Persistent atrial fibrillation The patient has symptomatic, recurrent atrial fibrillation. He is s/p CTI  ablation 2012.  He has severe LA enlargement he has failed medical therapy with amiodarone. Chads2vasc score is 3.  he is anticoagulated with coumadin. Therapeutic strategies for afib including medicine and ablation were discussed in detail with the patient today. Risk, benefits, and alternatives to EP study and radiofrequency ablation for afib were also discussed in detail today. These risks include but are not limited to stroke, bleeding, vascular damage, tamponade, perforation, damage to the esophagus, lungs, and other structures, pulmonary vein stenosis, worsening renal function, and death. The patient understands these risk and wishes to proceed.  We will therefore proceed with catheter ablation at the next available time.  Carto, ICE, anesthesia are requested for the procedure.  Will also obtain TEE prior to the procedure to exclude LAA thrombus and further evaluate atrial anatomy. He will need weekly INRs 3 weeks prior to ablation.  2. Obesity He has lost at least 30 lbs since I saw him last!  3. Nonischemic CM/ chronic systolic dysfunction Stable No change required today Spoke with Dr Aundra Dubin at length today.  We will defer LHC until we are able to reassess his condition 3 months post  ablation  4. OSA Compliance with CPAP advised  5. ETOh Avoidance encouraged  Risks, benefits and potential toxicities for medications prescribed and/or refilled reviewed with patient today.   Thompson Grayer MD, [facility] 09/05/2020 3:06 PM

## 2020-09-05 NOTE — Patient Instructions (Addendum)
Medication Instructions:  Your physician recommends that you continue on your current medications as directed. Please refer to the Current Medication list given to you today.  Labwork: None ordered.  Testing/Procedures: Your physician has requested that you have a TEE. During a TEE, sound waves are used to create images of your heart. It provides your doctor with information about the size and shape of your heart and how well your heart's chambers and valves are working. In this test, a transducer is attached to the end of a flexible tube that's guided down your throat and into your esophagus (the tube leading from you mouth to your stomach) to get a more detailed image of your heart. You are not awake for the procedure. Please see the instruction sheet given to you today. For further information please visit [email or web address].  Your physician has recommended that you have an ablation. Catheter ablation is a medical procedure used to treat some cardiac arrhythmias (irregular heartbeats). During catheter ablation, a long, thin, flexible tube is put into a blood vessel in your groin (upper thigh), or neck. This tube is called an ablation catheter. It is then guided to your heart through the blood vessel. Radio frequency waves destroy small areas of heart tissue where abnormal heartbeats may cause an arrhythmia to start. Please see the instruction sheet given to you today.    Any Other Special Instructions Will Be Listed Below (If Applicable).  If you need a refill on your cardiac medications before your next appointment, please call your pharmacy.      Cardiac Ablation Cardiac ablation is a procedure to destroy (ablate) some heart tissue that is sending bad signals. These bad signals cause problems in heart rhythm. The heart has many areas that make these signals. If there are problems in these areas, they can make the heart beat in a way that is not normal. Destroying some tissues can help make  the heart rhythm normal. Tell your doctor about:  Any allergies you have.  All medicines you are taking. These include vitamins, herbs, eye drops, creams, and over-the-counter medicines.  Any problems you or family members have had with medicines that make you fall asleep (anesthetics).  Any blood disorders you have.  Any surgeries you have had.  Any medical conditions you have, such as kidney failure.  Whether you are pregnant or may be pregnant. What are the risks? This is a safe procedure. But problems may occur, including:  Infection.  Bruising and bleeding.  Bleeding into the chest.  Stroke or blood clots.  Damage to nearby areas of your body.  Allergies to medicines or dyes.  The need for a pacemaker if the normal system is damaged.  Failure of the procedure to treat the problem. What happens before the procedure? Medicines Ask your doctor about:  Changing or stopping your normal medicines. This is important.  Taking aspirin and ibuprofen. Do not take these medicines unless your doctor tells you to take them.  Taking other medicines, vitamins, herbs, and supplements. General instructions  Follow instructions from your doctor about what you cannot eat or drink.  Plan to have someone take you home from the hospital or clinic.  If you will be going home right after the procedure, plan to have someone with you for 24 hours.  Ask your doctor what steps will be taken to prevent infection. What happens during the procedure?  An IV tube will be put into one of your veins.  You will be given a  medicine to help you relax.  The skin on your neck or groin will be numbed.  A cut (incision) will be made in your neck or groin. A needle will be put through your cut and into a large vein.  A tube (catheter) will be put into the needle. The tube will be moved to your heart.  Dye may be put through the tube. This helps your doctor see your heart.  Small devices  (electrodes) on the tube will send out signals.  A type of energy will be used to destroy some heart tissue.  The tube will be taken out.  Pressure will be held on your cut. This helps stop bleeding.  A bandage will be put over your cut. The exact procedure may vary among doctors and hospitals.   What happens after the procedure?  You will be watched until you leave the hospital or clinic. This includes checking your heart rate, breathing rate, oxygen, and blood pressure.  Your cut will be watched for bleeding. You will need to lie still for a few hours.  Do not drive for 24 hours or as long as your doctor tells you. Summary  Cardiac ablation is a procedure to destroy some heart tissue. This is done to treat heart rhythm problems.  Tell your doctor about any medical conditions you may have. Tell him or her about all medicines you are taking to treat them.  This is a safe procedure. But problems may occur. These include infection, bruising, bleeding, and damage to nearby areas of your body.  Follow what your doctor tells you about food and drink. You may also be told to change or stop some of your medicines.  After the procedure, do not drive for 24 hours or as long as your doctor tells you. This information is not intended to replace advice given to you by your health care provider. Make sure you discuss any questions you have with your health care provider. Document Revised: 05/05/2019 Document Reviewed: 05/05/2019 Elsevier Patient Education  2021 [employer].

## 2020-09-09 ENCOUNTER — Other Ambulatory Visit: Payer: Self-pay | Admitting: Internal Medicine

## 2020-09-09 ENCOUNTER — Other Ambulatory Visit: Payer: Self-pay | Admitting: Cardiology

## 2020-09-11 ENCOUNTER — Telehealth (HOSPITAL_COMMUNITY): Payer: Self-pay | Admitting: Pharmacy Technician

## 2020-09-11 NOTE — Telephone Encounter (Signed)
Called [employer] to check the status of the patient's application. Representative stated that the application is in the insurance verification process. Will follow up.

## 2020-09-17 ENCOUNTER — Other Ambulatory Visit: Payer: Self-pay

## 2020-09-17 ENCOUNTER — Ambulatory Visit (INDEPENDENT_AMBULATORY_CARE_PROVIDER_SITE_OTHER): Payer: Medicare HMO

## 2020-09-17 ENCOUNTER — Encounter: Payer: Self-pay | Admitting: Cardiology

## 2020-09-17 ENCOUNTER — Ambulatory Visit: Payer: Medicare HMO | Admitting: Cardiology

## 2020-09-17 VITALS — BP 130/70 | Ht 73.0 in | Wt 268.0 lb

## 2020-09-17 DIAGNOSIS — I4891 Unspecified atrial fibrillation: Secondary | ICD-10-CM

## 2020-09-17 DIAGNOSIS — I480 Paroxysmal atrial fibrillation: Secondary | ICD-10-CM

## 2020-09-17 DIAGNOSIS — E1169 Type 2 diabetes mellitus with other specified complication: Secondary | ICD-10-CM

## 2020-09-17 DIAGNOSIS — I5022 Chronic systolic (congestive) heart failure: Secondary | ICD-10-CM | POA: Diagnosis not present

## 2020-09-17 DIAGNOSIS — I428 Other cardiomyopathies: Secondary | ICD-10-CM | POA: Diagnosis not present

## 2020-09-17 DIAGNOSIS — D6869 Other thrombophilia: Secondary | ICD-10-CM | POA: Diagnosis not present

## 2020-09-17 DIAGNOSIS — E785 Hyperlipidemia, unspecified: Secondary | ICD-10-CM

## 2020-09-17 DIAGNOSIS — I10 Essential (primary) hypertension: Secondary | ICD-10-CM | POA: Diagnosis not present

## 2020-09-17 DIAGNOSIS — Z7901 Long term (current) use of anticoagulants: Secondary | ICD-10-CM | POA: Diagnosis not present

## 2020-09-17 DIAGNOSIS — Z5181 Encounter for therapeutic drug level monitoring: Secondary | ICD-10-CM

## 2020-09-17 DIAGNOSIS — I513 Intracardiac thrombosis, not elsewhere classified: Secondary | ICD-10-CM | POA: Diagnosis not present

## 2020-09-17 LAB — POCT INR: INR: 3.3 — AB (ref 2.0–3.0)

## 2020-09-17 NOTE — Progress Notes (Signed)
Cardiology Office Note:    Date:  09/17/2020   ID:  [name], DOB 08/21/1955, MRN [id number]  PCP:  [name], MD  Cardiologist:  Buford Dresser, MD PhD  Referring MD: [name], *   CC: follow up  History of Present Illness:    [name] is a 65 y.o. male with a hx of persistent atrial fibrillation, atrial flutter with prior ablation in 2012, prior cardioversions, HTN, chronic systolic heart failure (thought to be tachycardia induced) with EF 30-35% on tte 04/2018, OSA who is seen in follow up today. I first saw him as a new consult 12/28/18.  Today: Planned for TEE and atrial fibrillation ablation with Dr. Rayann Heman on 10/18/20.   We reviewed his hospitalization in 07/2020. Had planned for outpatient TEE-CV, found to have severely reduced LVEF on TEE as well as LAA thrombus. RV failure as well. Started on milrinone, transitioned to coumadin. Started on amiodarone. Repeat TEE showed no thrombus, was cardioverted. Was back in atrial fibrillation at HF clinic visit 08/21/20, underwent outpatient DCCV 08/29/20.  He reports that he is feel well after his procedures. He reports that his weight is stabled, he notes losing 35 lbs after his hospital visit. He denies completing any procedures that requires him to stay in the hospital for a week. He has some bruising after taking 5 mg of Coumadin. He denies any SOB, chest pain, discomfort, or tightness. He has no LE edema, PND, or orthopnea. He has no lightheadedness or dizziness.     Past Medical History:  Diagnosis Date  . Asthma    as a teenager - does not use an inhaler although pt said he has an albuteral inhaler  . Atrial fibrillation ([place])    persistant 02/2009  . Atrial flutter ([name])    s/p CTI ablation 08/06/10  . Cataract   . Chronic rhinitis   . Colon polyp   . Congestive heart failure ([place])   . Diverticulosis    colonoscopy 04/03/2009  . Hyperlipidemia   . Hypertension   . Morbid obesity  ([place])    target weight = 219  for BMI < 30  . Nonischemic cardiomyopathy (H[facility])    tachycardia mediated  . Seasonal allergies   . Sleep apnea    original 2001 - wears C-PaP  . Type 2 diabetes mellitus ([place])     Past Surgical History:  Procedure Laterality Date  . ATRIAL FIBRILLATION ABLATION N/A 12/14/2018   Procedure: ATRIAL FIBRILLATION ABLATION;  Surgeon: Thompson Grayer, MD;  Location: [facility];  Service: Cardiovascular;  Laterality: N/A;  . atrial flutter ablation  08/06/10  . CARDIOVERSION N/A 07/05/2018   Procedure: CARDIOVERSION;  Surgeon: Buford Dresser, MD;  Location: [facility] ENDOSCOPY;  Service: Cardiovascular;  Laterality: N/A;  . CARDIOVERSION N/A 08/08/2020   Procedure: CARDIOVERSION;  Surgeon: Larey Dresser, MD;  Location: [facility] ENDOSCOPY;  Service: Cardiovascular;  Laterality: N/A;  . CARDIOVERSION N/A 08/29/2020   Procedure: CARDIOVERSION;  Surgeon: Jolaine Artist, MD;  Location: [facility] ENDOSCOPY;  Service: Cardiovascular;  Laterality: N/A;  . COLONOSCOPY    . POLYPECTOMY    . RIGHT HEART CATH N/A 08/06/2020   Procedure: RIGHT HEART CATH;  Surgeon: Larey Dresser, MD;  Location: [facility];  Service: Cardiovascular;  Laterality: N/A;  . TEE WITHOUT CARDIOVERSION N/A 12/14/2018   Procedure: TRANSESOPHAGEAL ECHOCARDIOGRAM (TEE);  Surgeon: Thompson Grayer, MD;  Location: [place] [facility];  Service: Cardiovascular;  Laterality: N/A;  .  TEE WITHOUT CARDIOVERSION N/A 08/03/2020   Procedure: TRANSESOPHAGEAL ECHOCARDIOGRAM (TEE);  Surgeon: Sanda Klein, MD;  Location: [facility];  Service: Cardiovascular;  Laterality: N/A;  . TEE WITHOUT CARDIOVERSION N/A 08/08/2020   Procedure: TRANSESOPHAGEAL ECHOCARDIOGRAM (TEE);  Surgeon: Larey Dresser, MD;  Location: [facility] ENDOSCOPY;  Service: Cardiovascular;  Laterality: N/A;    Current Medications: Current Outpatient Medications on File Prior to Visit  Medication Sig  . albuterol (PROVENTIL HFA;VENTOLIN HFA) 108  (90 Base) MCG/ACT inhaler Inhale 2 puffs into the lungs every 6 (six) hours as needed for wheezing or shortness of breath.  [name] amiodarone (PACERONE) 200 MG tablet TAKE 1 TABLET BY MOUTH TWICE A DAY X 2 WEEKS, THEN REDUCE TO 1 TABLET A DAY  . B-D UF III MINI PEN NEEDLES 31G X 5 MM MISC 1 EACH BY OTHER ROUTE 4 (FOUR) TIMES DAILY. E11.9  . carvedilol (COREG) 3.125 MG tablet TAKE 1 TABLET BY MOUTH TWICE A DAY WITH FOOD  . Continuous Blood Gluc Receiver (FREESTYLE LIBRE 14 DAY READER) DEVI USE AS DIRECTED  . Continuous Blood Gluc Sensor (FREESTYLE LIBRE 14 DAY SENSOR) MISC Use as directed by Diabetes educator  . digoxin (LANOXIN) 0.125 MG tablet Take 0.5 tablets (0.0625 mg total) by mouth daily.  [name] doxylamine, Sleep, (UNISOM) 25 MG tablet Take 50 mg by mouth at bedtime as needed for sleep.   . empagliflozin (JARDIANCE) 10 MG TABS tablet Take 1 tablet (10 mg total) by mouth daily.  [name] enoxaparin (LOVENOX) 120 MG/0.8ML injection INJECT 1 SYRINGE (120 MG TOTAL) INTO THE SKIN EVERY TWELVE HOURS FOR 5 DAYS.  . furosemide (LASIX) 40 MG tablet Take 1 tablet (40 mg total) by mouth daily as needed.  . hydrALAZINE (APRESOLINE) 50 MG tablet TAKE 1 AND 1/2 TABLETS BY MOUTH EVERY 8 HOURS  . insulin aspart (NOVOLOG) 100 UNIT/ML FlexPen Inject 8-12 Units into the skin 3 (three) times daily with meals.  . insulin degludec (TRESIBA FLEXTOUCH) 200 UNIT/ML FlexTouch Pen Inject 40 Units into the skin daily.  . isosorbide mononitrate (IMDUR) 60 MG 24 hr tablet TAKE 1 TABLET BY MOUTH EVERY DAY  . levothyroxine (SYNTHROID) 50 MCG tablet TAKE 1 TABLET BY MOUTH EVERY DAY BEFORE BREAKFAST  . mometasone (NASONEX) 50 MCG/ACT nasal spray Place 2 sprays into the nose daily as needed (allergies or rhinitis).  . Multiple Vitamins-Minerals (MULTIVITAMIN WITH MINERALS) tablet Take 2 tablets by mouth at bedtime. Chewable  . OZEMPIC, 0.25 OR 0.5 MG/DOSE, 2 MG/1.5ML SOPN INJECT 0.5 MG INTO THE SKIN ONCE A WEEK.  . sacubitril-valsartan  (ENTRESTO) 24-26 MG Take 1 tablet by mouth 2 (two) times daily.  [name] spironolactone (ALDACTONE) 25 MG tablet Take 1 tablet (25 mg total) by mouth daily.  . SYMBICORT 80-4.5 MCG/ACT inhaler USE 2 INHALATIONS TWICE A DAY  . thiamine 100 MG tablet Take 1 tablet (100 mg total) by mouth daily.  . vitamin C (ASCORBIC ACID) 500 MG tablet Take 500 mg by mouth in the morning.  . warfarin (COUMADIN) 5 MG tablet TAKE 1 TABLET TO 1 AND 1/2 TABLETS BY MOUTH DAILY AS DIRECTED BY THE COUMADIN CLINIC.  [name] warfarin (COUMADIN) 5 MG tablet TAKE 2 (10MG ) TABLETS BY MOUTH ON 2/26 AND 2/27, THEN TAKE 1 AND 1/2 TABLETS (7.5MG ) BY MOUTH DAILY UNTIL SEEN IN COUMDIN CLINIC   No current facility-administered medications on file prior to visit.     Allergies:   Patient has no known allergies.   Social History   Tobacco Use  . Smoking  status: Never Smoker  . Smokeless tobacco: Never Used  Vaping Use  . Vaping Use: Never used  Substance Use Topics  . Alcohol use: Yes    Alcohol/week: 21.0 standard drinks    Types: 21 Cans of beer per week    Comment: previously quite heavy, working on cessation   . Drug use: No     Family History: The patient's family history includes COPD in his father; Colon cancer in his brother; Hypertension in his mother; Kidney failure in his mother. There is no history of Esophageal cancer, Rectal cancer, or Stomach cancer.  ROS:   Please see the history of present illness.  Additional pertinent ROS otherwise unremarkable.  EKGs/Labs/Other Studies Reviewed:    The following studies were reviewed today: See HPI for summary of recent TEE findings  TTE 09/04/19 1. Left ventricular ejection fraction, by estimation, is 40 to 45%. The  left ventricle has mildly decreased function. The left ventricle  demonstrates global hypokinesis. There is severe left ventricular  hypertrophy, asymmetric and septal predominant  (parasternal views of the septum raise question of HOCM or infiltrative   cardiomyopathy - cardiac MRI could be considered if clinically  appropriate). Left ventricular diastolic parameters are consistent with  Grade I diastolic dysfunction (impaired  relaxation).  2. Right ventricular systolic function is normal. The right ventricular  size is normal. Tricuspid regurgitation signal is inadequate for assessing  PA pressure.  3. Left atrial size was mildly dilated.  4. The mitral valve is grossly normal. Trivial mitral valve  regurgitation.  5. The aortic valve was not well visualized. Aortic valve regurgitation  is not visualized.  6. Aortic dilatation noted. There is mild dilatation of the ascending  aorta.  7. The inferior vena cava is normal in size with greater than 50%  respiratory variability, suggesting right atrial pressure of 3 mmHg.   Lexiscan 04/07/19  Nuclear stress EF: 49%.  The left ventricular ejection fraction is mildly decreased (45-54%).  No T wave inversion was noted during stress.  There was no ST segment deviation noted during stress.  Defect 1: There is a small defect of mild severity.  This is an intermediate risk study.   Small size, mild intensity fixed apical perfusion defect, likely thinning. No significant reversible ischemia. Dilated LV with mild hypokinesis - LVEF 49%. This is an intermediate risk study due to decreased LV function.  TTE 05/03/18 Left ventricle: The cavity size was normal. There was severe   concentric hypertrophy. Systolic function was moderately to   severely reduced. The estimated ejection fraction was in the   range of 30% to 35%. Diffuse hypokinesis. The study was not   technically sufficient to allow evaluation of LV diastolic   dysfunction due to atrial fibrillation. - Aortic valve: There was no regurgitation. - Aortic root: The aortic root was normal in size. - Mitral valve: There was trivial regurgitation. - Left atrium: The atrium was severely dilated. - Right ventricle: Systolic  function was normal. - Right atrium: The atrium was moderately dilated. - Tricuspid valve: There was trivial regurgitation. - Pulmonic valve: Transvalvular velocity was within the normal   range. There was no evidence for stenosis. - Pulmonary arteries: Systolic pressure was within the normal   range. - Inferior vena cava: The vessel was normal in size. The   respirophasic diameter changes were in the normal range (= 50%),   consistent with normal central venous pressure. - Pericardium, extracardiac: There was no pericardial effusion.  Impressions: -  When compared to the prior study from 04/02/2017 LVEF has   decreased from 55-60% to 30-35% with diffuse hypokinesis.  TEE 12/14/18  1. The left ventricle has moderate-severely reduced systolic function, with an ejection fraction of 30-35%. The cavity size was moderately dilated. Left ventrical global hypokinesis without regional wall motion abnormalities.  2. The right ventricle has normal systolc function. The cavity was normal. There is no increase in right ventricular wall thickness.  3. Left atrial size was severely dilated.  4. Dense smoke in atria and appendage. Stagnant pre thrombus and likely thrombus in mid to distal LAA High risk for embolic event EP procedure cancelled.  5. Right atrial size was moderately dilated.  6. The mitral valve is grossly normal. Mild thickening of the mitral valve leaflet. Mild calcification of the mitral valve leaflet. Mitral valve regurgitation is mild to moderate by color flow Doppler.  7. Restricted posterior leaflet motion.  8. The aortic valve is tricuspid Moderate thickening of the aortic valve Sclerosis without any evidence of stenosis of the aortic valve. Aortic valve regurgitation is trivial by color flow Doppler.  9. The aortic root is normal in size and structure.  EKG:   4/22-Atrial fibrillation, Rate 67 bpm 2/22- Atrial fibrillation with RVR at 123 bpm, IVCD  Recent Labs: 06/29/2020: TSH  3.52 08/03/2020: B Natriuretic Peptide 1,668.7 08/09/2020: Hemoglobin 16.0; Platelets 193 08/10/2020: ALT 59; Magnesium 2.2 08/21/2020: BUN 20; Creatinine, Ser 1.52; Potassium 4.3; Sodium 138  Recent Lipid Panel    Component Value Date/Time   CHOL 143 05/16/2020 1035   TRIG 107.0 05/16/2020 1035   HDL 53.20 05/16/2020 1035   CHOLHDL 3 05/16/2020 1035   VLDL 21.4 05/16/2020 1035   LDLCALC 69 05/16/2020 1035   LDLDIRECT 81.0 08/31/2015 0905    Physical Exam:    VS:  BP 130/70   Ht 6\' 1"  (1.854 m)   Wt 268 lb (121.6 kg)   SpO2 96%   BMI 35.36 kg/m     Wt Readings from Last 3 Encounters:  09/17/20 268 lb (121.6 kg)  09/05/20 270 lb 6.4 oz (122.7 kg)  08/29/20 265 lb (120.2 kg)   GEN: Well nourished, well developed. No acute distress today HEENT: Normal, moist mucous membranes NECK: No JVD CARDIAC: irregularly irregular rhythm, normal S1 and S2, no rubs or gallops. No murmur. VASCULAR: Radial and DP pulses 2+ bilaterally. No carotid bruits RESPIRATORY:  Clear to auscultation without rales, wheezing or rhonchi  ABDOMEN: Soft, non-tender, non-distended MUSCULOSKELETAL:  Ambulates independently SKIN: Warm and dry, no edema NEUROLOGIC:  Alert and oriented x 3. No focal neuro deficits noted. PSYCHIATRIC:  Normal affect   ASSESSMENT:    1. Paroxysmal atrial fibrillation (HCC)   2. NICM (nonischemic cardiomyopathy) ([place])   3. Chronic systolic heart failure ([place])   4. Anticoagulated on Coumadin   5. Acquired thrombophilia ([place])   6. Essential hypertension   7. Type 2 diabetes mellitus with hyperlipidemia (HCC)    PLAN:    Paroxysmal atrial fibrillation History of atrial fibrillation and atrial flutter, s/p prior cardioversions and CTI -CHADSVASC=3 -Has OSA, encouraged adherence to CPAP -see summary of recent hospital course. Planned for atrial fibrillation ablation 10/18/20 with Dr. Rayann Heman -now on coumadin given LAA thrombus with both rivaroxaban and dabigatran. INR today with  coumadin clinic -continue amiodarone, carvedilol, digoxin -discussed pacemaker/ICD indications today  Nonischemic cardiomyopathy, with chronic systolic and diastolic heart failure:  -euvolemic, NYHA class I-II today, but recent hospitalization for cardiogenic shock requiring milrinone -Lexiscan done,  no reversible ischemia. Have not been able to do coronary angiography due to renal disease -appreciate heart failure clinic assistance -continue carvedilol, digoxin, amiodarone, empagliflozin, hydralazine, isosorbide, entresto, spironolactone -on GLP1RA and SGLT2i given diabetes. Followed by Dr. Renne Crigler in endocrinology.  Medication history: -amiodarone and carvedilol were stopped during admission 08/2019 due to symptomatic bradycardia, in the setting of hyperosmolar hyperglycemia. Monitor for bradycardia -came off hydralazine 50 mg BID, imdur 30 mg daily due to hypotension. Monitor for hypotension  Hypertension: -continue medications as above  Type II diabetes with hyperlipidemia: -on semaglutide (GLP1RA), empagliflozin (SGLT2i) and insulin -no aspirin as he is on coumadin -statin on hold as LFTs were elevated, restart at follow up if LFTs normalize  Cardiac risk counseling and prevention recommendations: -recommend heart healthy/Mediterranean diet, with whole grains, fruits, vegetable, fish, lean meats, nuts, and olive oil. Limit salt. -recommend moderate walking, 3-5 times/week for 30-50 minutes each session. Aim for at least 150 minutes.week. Goal should be pace of 3 miles/hours, or walking 1.5 miles in 30 minutes -recommend avoidance of tobacco products. Avoid excess alcohol. -ASCVD risk score: The 10-year ASCVD risk score Mikey Bussing DC Brooke Bonito., et al., 2013) is: 21.3%   Values used to calculate the score:     Age: 1 years     Sex: Male     Is Non-Hispanic African American: No     Diabetic: Yes     Tobacco smoker: No     Systolic Blood Pressure: 845 mmHg     Is BP treated: Yes     HDL  Cholesterol: 53.2 mg/dL     Total Cholesterol: 143 mg/dL    Plan for follow up: 3 months to stagger visits  Buford Dresser, MD, PhD, [facility]   Medication Adjustments/Labs and Tests Ordered: Current medicines are reviewed at length with the patient today.  Concerns regarding medicines are outlined above.  Orders Placed This Encounter  Procedures  . EKG 12-Lead   No orders of the defined types were placed in this encounter.   Patient Instructions  Medication Instructions:  Your Physician recommend you continue on your current medication as directed.    *If you need a refill on your cardiac medications before your next appointment, please call your pharmacy*   Lab Work: None   Testing/Procedures: None   Follow-Up: At [facility], you and your health needs are our priority.  As part of our continuing mission to provide you with exceptional heart care, we have created designated Provider Care Teams.  These Care Teams include your primary Cardiologist (physician) and Advanced Practice Providers (APPs -  Physician Assistants and Nurse Practitioners) who all work together to provide you with the care you need, when you need it.  We recommend signing up for the patient portal called "MyChart".  Sign up information is provided on this After Visit Summary.  MyChart is used to connect with patients for Virtual Visits (Telemedicine).  Patients are able to view lab/test results, encounter notes, upcoming appointments, etc.  Non-urgent messages can be sent to your provider as well.   To learn more about what you can do with MyChart, go to [email or web address].    Your next appointment:   3 month(s) @ [address] [place], [place] [address]   The format for your next appointment:   In Person  Provider:   Buford Dresser, MD       I,Alexis Bryant,acting as a scribe for Buford Dresser, MD.,have documented all relevant  documentation on  the behalf of Buford Dresser, MD,as directed by  Buford Dresser, MD while in the presence of Buford Dresser, MD.   Signed, Buford Dresser, MD PhD 09/17/2020  [facility]

## 2020-09-17 NOTE — Patient Instructions (Signed)
Continue on same dosage 1.5 tablets of warfarin daily except for 1 tablet on Mondays, Wednesdays and Fridays. Recheck INR in 1 week. A Fib ablation 5/5; weekly INR checks. Coumadin Clinic [phone]. Eat greens over the next 2 days.

## 2020-09-17 NOTE — Progress Notes (Deleted)
Cardiology Office Note:    Date:  09/17/2020   ID:  [name], DOB January 01, 1956, MRN [id number]  PCP:  [name], MD  Cardiologist:  Buford Dresser, MD PhD  Referring MD: [name], *   CC: follow up  History of Present Illness:    [name] is a 65 y.o. male with a hx of persistent atrial fibrillation, atrial flutter with prior ablation in 2012, prior cardioversions, HTN, chronic systolic heart failure (thought to be tachycardia induced) with EF 30-35% on tte 04/2018, OSA who is seen in follow up today. I first saw him as a new consult 12/28/18.  Today: Planned for TEE and atrial fibrillation ablation with Dr. Rayann Heman on 10/18/20.   We reviewed his hospitalization in 07/2020. Had planned for outpatient TEE-CV, found to have severely reduced LVEF on TEE as well as LAA thrombus. RV failure as well. Started on milrinone, transitioned to coumadin. Started on amiodarone. Repeat TEE showed no thrombus, was cardioverted. Was back in atrial fibrillation at HF clinic visit 08/21/20, underwent outpatient DCCV 08/29/20.    Past Medical History:  Diagnosis Date  . Asthma    as a teenager - does not use an inhaler although pt said he has an albuteral inhaler  . Atrial fibrillation ([place])    persistant 02/2009  . Atrial flutter ([place])    s/p CTI ablation 08/06/10  . Cataract   . Chronic rhinitis   . Colon polyp   . Congestive heart failure ([place])   . Diverticulosis    colonoscopy 04/03/2009  . Hyperlipidemia   . Hypertension   . Morbid obesity ([place])    target weight = 219  for BMI < 30  . Nonischemic cardiomyopathy ([facility])    tachycardia mediated  . Seasonal allergies   . Sleep apnea    original 2001 - wears C-PaP  . Type 2 diabetes mellitus ([place])     Past Surgical History:  Procedure Laterality Date  . ATRIAL FIBRILLATION ABLATION N/A 12/14/2018   Procedure: ATRIAL FIBRILLATION ABLATION;  Surgeon: Thompson Grayer, MD;  Location: [place] [facility];   Service: Cardiovascular;  Laterality: N/A;  . atrial flutter ablation  08/06/10  . CARDIOVERSION N/A 07/05/2018   Procedure: CARDIOVERSION;  Surgeon: Buford Dresser, MD;  Location: [facility] ENDOSCOPY;  Service: Cardiovascular;  Laterality: N/A;  . CARDIOVERSION N/A 08/08/2020   Procedure: CARDIOVERSION;  Surgeon: Larey Dresser, MD;  Location: [facility] ENDOSCOPY;  Service: Cardiovascular;  Laterality: N/A;  . CARDIOVERSION N/A 08/29/2020   Procedure: CARDIOVERSION;  Surgeon: Jolaine Artist, MD;  Location: [facility] ENDOSCOPY;  Service: Cardiovascular;  Laterality: N/A;  . COLONOSCOPY    . POLYPECTOMY    . RIGHT HEART CATH N/A 08/06/2020   Procedure: RIGHT HEART CATH;  Surgeon: Larey Dresser, MD;  Location: [place] [facility];  Service: Cardiovascular;  Laterality: N/A;  . TEE WITHOUT CARDIOVERSION N/A 12/14/2018   Procedure: TRANSESOPHAGEAL ECHOCARDIOGRAM (TEE);  Surgeon: Thompson Grayer, MD;  Location: [place] [facility];  Service: Cardiovascular;  Laterality: N/A;  . TEE WITHOUT CARDIOVERSION N/A 08/03/2020   Procedure: TRANSESOPHAGEAL ECHOCARDIOGRAM (TEE);  Surgeon: Sanda Klein, MD;  Location: [place];  Service: Cardiovascular;  Laterality: N/A;  . TEE WITHOUT CARDIOVERSION N/A 08/08/2020   Procedure: TRANSESOPHAGEAL ECHOCARDIOGRAM (TEE);  Surgeon: Larey Dresser, MD;  Location: [facility] ENDOSCOPY;  Service: Cardiovascular;  Laterality: N/A;    Current Medications: Current Outpatient Medications on File Prior to Visit  Medication Sig  . albuterol (PROVENTIL HFA;VENTOLIN HFA)  108 (90 Base) MCG/ACT inhaler Inhale 2 puffs into the lungs every 6 (six) hours as needed for wheezing or shortness of breath.  [name] amiodarone (PACERONE) 200 MG tablet TAKE 1 TABLET BY MOUTH TWICE A DAY X 2 WEEKS, THEN REDUCE TO 1 TABLET A DAY  . B-D UF III MINI PEN NEEDLES 31G X 5 MM MISC 1 EACH BY OTHER ROUTE 4 (FOUR) TIMES DAILY. E11.9  . carvedilol (COREG) 3.125 MG tablet TAKE 1 TABLET BY MOUTH TWICE A DAY WITH FOOD  .  Continuous Blood Gluc Receiver (FREESTYLE LIBRE 14 DAY READER) DEVI USE AS DIRECTED  . Continuous Blood Gluc Sensor (FREESTYLE LIBRE 14 DAY SENSOR) MISC Use as directed by Diabetes educator  . digoxin (LANOXIN) 0.125 MG tablet Take 0.5 tablets (0.0625 mg total) by mouth daily.  [name] doxylamine, Sleep, (UNISOM) 25 MG tablet Take 50 mg by mouth at bedtime as needed for sleep.   . empagliflozin (JARDIANCE) 10 MG TABS tablet Take 1 tablet (10 mg total) by mouth daily.  [name] enoxaparin (LOVENOX) 120 MG/0.8ML injection INJECT 1 SYRINGE (120 MG TOTAL) INTO THE SKIN EVERY TWELVE HOURS FOR 5 DAYS.  . furosemide (LASIX) 40 MG tablet Take 1 tablet (40 mg total) by mouth daily as needed.  . hydrALAZINE (APRESOLINE) 50 MG tablet TAKE 1 AND 1/2 TABLETS BY MOUTH EVERY 8 HOURS  . insulin aspart (NOVOLOG) 100 UNIT/ML FlexPen Inject 8-12 Units into the skin 3 (three) times daily with meals.  . insulin degludec (TRESIBA FLEXTOUCH) 200 UNIT/ML FlexTouch Pen Inject 40 Units into the skin daily.  . isosorbide mononitrate (IMDUR) 60 MG 24 hr tablet TAKE 1 TABLET BY MOUTH EVERY DAY  . levothyroxine (SYNTHROID) 50 MCG tablet TAKE 1 TABLET BY MOUTH EVERY DAY BEFORE BREAKFAST  . mometasone (NASONEX) 50 MCG/ACT nasal spray Place 2 sprays into the nose daily as needed (allergies or rhinitis).  . Multiple Vitamins-Minerals (MULTIVITAMIN WITH MINERALS) tablet Take 2 tablets by mouth at bedtime. Chewable  . OZEMPIC, 0.25 OR 0.5 MG/DOSE, 2 MG/1.5ML SOPN INJECT 0.5 MG INTO THE SKIN ONCE A WEEK.  . sacubitril-valsartan (ENTRESTO) 24-26 MG Take 1 tablet by mouth 2 (two) times daily.  [name] spironolactone (ALDACTONE) 25 MG tablet Take 1 tablet (25 mg total) by mouth daily.  . SYMBICORT 80-4.5 MCG/ACT inhaler USE 2 INHALATIONS TWICE A DAY  . thiamine 100 MG tablet Take 1 tablet (100 mg total) by mouth daily.  . vitamin C (ASCORBIC ACID) 500 MG tablet Take 500 mg by mouth in the morning.  . warfarin (COUMADIN) 5 MG tablet TAKE 1 TABLET TO 1 AND  1/2 TABLETS BY MOUTH DAILY AS DIRECTED BY THE COUMADIN CLINIC.  [name] warfarin (COUMADIN) 5 MG tablet TAKE 2 (10MG ) TABLETS BY MOUTH ON 2/26 AND 2/27, THEN TAKE 1 AND 1/2 TABLETS (7.5MG ) BY MOUTH DAILY UNTIL SEEN IN COUMDIN CLINIC   No current facility-administered medications on file prior to visit.     Allergies:   Patient has no known allergies.   Social History   Tobacco Use  . Smoking status: Never Smoker  . Smokeless tobacco: Never Used  Vaping Use  . Vaping Use: Never used  Substance Use Topics  . Alcohol use: Yes    Alcohol/week: 21.0 standard drinks    Types: 21 Cans of beer per week    Comment: previously quite heavy, working on cessation   . Drug use: No     Family History: The patient's family history includes COPD in his father; Colon cancer  in his brother; Hypertension in his mother; Kidney failure in his mother. There is no history of Esophageal cancer, Rectal cancer, or Stomach cancer.  ROS:   Please see the history of present illness.  Additional pertinent ROS otherwise unremarkable.  EKGs/Labs/Other Studies Reviewed:    The following studies were reviewed today: TTE 09/15/2019 1. Left ventricular ejection fraction, by estimation, is 40 to 45%. The  left ventricle has mildly decreased function. The left ventricle  demonstrates global hypokinesis. There is severe left ventricular  hypertrophy, asymmetric and septal predominant  (parasternal views of the septum raise question of HOCM or infiltrative  cardiomyopathy - cardiac MRI could be considered if clinically  appropriate). Left ventricular diastolic parameters are consistent with  Grade I diastolic dysfunction (impaired  relaxation).  2. Right ventricular systolic function is normal. The right ventricular  size is normal. Tricuspid regurgitation signal is inadequate for assessing  PA pressure.  3. Left atrial size was mildly dilated.  4. The mitral valve is grossly normal. Trivial mitral valve   regurgitation.  5. The aortic valve was not well visualized. Aortic valve regurgitation  is not visualized.  6. Aortic dilatation noted. There is mild dilatation of the ascending  aorta.  7. The inferior vena cava is normal in size with greater than 50%  respiratory variability, suggesting right atrial pressure of 3 mmHg.   Lexiscan 04/07/19  Nuclear stress EF: 49%.  The left ventricular ejection fraction is mildly decreased (45-54%).  No T wave inversion was noted during stress.  There was no ST segment deviation noted during stress.  Defect 1: There is a small defect of mild severity.  This is an intermediate risk study.   Small size, mild intensity fixed apical perfusion defect, likely thinning. No significant reversible ischemia. Dilated LV with mild hypokinesis - LVEF 49%. This is an intermediate risk study due to decreased LV function.  TTE 05/03/18 Left ventricle: The cavity size was normal. There was severe   concentric hypertrophy. Systolic function was moderately to   severely reduced. The estimated ejection fraction was in the   range of 30% to 35%. Diffuse hypokinesis. The study was not   technically sufficient to allow evaluation of LV diastolic   dysfunction due to atrial fibrillation. - Aortic valve: There was no regurgitation. - Aortic root: The aortic root was normal in size. - Mitral valve: There was trivial regurgitation. - Left atrium: The atrium was severely dilated. - Right ventricle: Systolic function was normal. - Right atrium: The atrium was moderately dilated. - Tricuspid valve: There was trivial regurgitation. - Pulmonic valve: Transvalvular velocity was within the normal   range. There was no evidence for stenosis. - Pulmonary arteries: Systolic pressure was within the normal   range. - Inferior vena cava: The vessel was normal in size. The   respirophasic diameter changes were in the normal range (= 50%),   consistent with normal central  venous pressure. - Pericardium, extracardiac: There was no pericardial effusion.  Impressions: - When compared to the prior study from 04/02/2017 LVEF has   decreased from 55-60% to 30-35% with diffuse hypokinesis.  TEE 12/14/18  1. The left ventricle has moderate-severely reduced systolic function, with an ejection fraction of 30-35%. The cavity size was moderately dilated. Left ventrical global hypokinesis without regional wall motion abnormalities.  2. The right ventricle has normal systolc function. The cavity was normal. There is no increase in right ventricular wall thickness.  3. Left atrial size was severely dilated.  4.  Dense smoke in atria and appendage. Stagnant pre thrombus and likely thrombus in mid to distal LAA High risk for embolic event EP procedure cancelled.  5. Right atrial size was moderately dilated.  6. The mitral valve is grossly normal. Mild thickening of the mitral valve leaflet. Mild calcification of the mitral valve leaflet. Mitral valve regurgitation is mild to moderate by color flow Doppler.  7. Restricted posterior leaflet motion.  8. The aortic valve is tricuspid Moderate thickening of the aortic valve Sclerosis without any evidence of stenosis of the aortic valve. Aortic valve regurgitation is trivial by color flow Doppler.  9. The aortic root is normal in size and structure.  EKG:  EKG is personally reviewed.  The ekg ordered today demonstrates atrial fibrillation with RVR at 123 bpm, IVCD Recent Labs: 06/29/2020: TSH 3.52 08/03/2020: B Natriuretic Peptide 1,668.7 08/09/2020: Hemoglobin 16.0; Platelets 193 08/10/2020: ALT 59; Magnesium 2.2 08/21/2020: BUN 20; Creatinine, Ser 1.52; Potassium 4.3; Sodium 138  Recent Lipid Panel    Component Value Date/Time   CHOL 143 05/16/2020 1035   TRIG 107.0 05/16/2020 1035   HDL 53.20 05/16/2020 1035   CHOLHDL 3 05/16/2020 1035   VLDL 21.4 05/16/2020 1035   LDLCALC 69 05/16/2020 1035   LDLDIRECT 81.0 08/31/2015 0905     Physical Exam:    VS:  Ht 6\' 1"  (1.854 m)   Wt 268 lb (121.6 kg)   BMI 35.36 kg/m     Wt Readings from Last 3 Encounters:  09/17/20 268 lb (121.6 kg)  09/05/20 270 lb 6.4 oz (122.7 kg)  08/29/20 265 lb (120.2 kg)   GEN: Well nourished, well developed. Conversationally dyspneic HEENT: Normal, moist mucous membranes NECK: No JVD CARDIAC: tachycardic, irregularly irregular rhythm, normal S1 and S2, no rubs or gallops. No murmur. VASCULAR: Radial and DP pulses 2+ bilaterally. No carotid bruits RESPIRATORY:  Clear to auscultation without rales, wheezing or rhonchi  ABDOMEN: Soft, non-tender, non-distended MUSCULOSKELETAL:  Ambulates independently SKIN: Warm and dry, no edema NEUROLOGIC:  Alert and oriented x 3. No focal neuro deficits noted. PSYCHIATRIC:  Normal affect   ASSESSMENT:    No diagnosis found. PLAN:    Paroxysmal atrial fibrillation, with RVR today History of atrial fibrillation and atrial flutter, s/p prior cardioversions and CTI -CHADSVASC=3 -Has OSA, encouraged adherence to CPAP -Bradycardia with amiodarone, carvedilol, though he had other medical issues at the time of significant bradycardia -Continue dabigatran, denies missed doses in the last few weeks. -he had TEE pre EP procedure 12/14/2018 with stagnant flow and likely thrombus in the LAA. Ablation cancelled. Was on rivaroxaban prior to the TEE, changed to dabigatran after. Given the history, I would recommend TEE prior to cardioversion to fully exclude LV thrombus. If no thrombus, can then proceed with cardioverison -he has a history of bradycardia but is very rapid today. I will given him temporary short acting metoprolol PRN for rate control. Ideally he can stop this once he is back in sinus rhythm -if he cannot hold sinus with a cardioversion, will need to discuss next steps with Dr. Rayann Heman. Advantage would be that he has already had TEE to discuss ablation if needed -pre procedure labs, orders  done  Nonischemic cardiomyopathy, with chronic systolic and diastolic heart failure:  -euvolemic, NYHA class I -had planned for [facility], then had asymptomatic covid positive test, then renal failure. Lexiscan done, no reversible ischemia. -he has tolerated losartan and spironolactone -Continue 40 mg furosemide BID, no edema today -repeat echo 09/04/19 with EF 40-45% -  discussed cMRI, declines -discussed entresto, declines. Would like to stay on current medications -on GLP1RA given diabetes. Followed by Dr. Renne Crigler in endocrinology.  Medication history: -amiodarone and carvedilol stopped during admission 08/2019 due to symptomatic bradycardia, in the setting of hyperosmolar hyperglycemia. -off hydralazine 50 mg BID, imdur 30 mg daily due to hypotension  Hypertension: -continue medications as above  Type II diabetes with hyperlipidemia: -on semaglutide (GLP1RA) and insulin -continue rosuvastatin -no aspirin as he is on dabigatran  Cardiac risk counseling and prevention recommendations: -recommend heart healthy/Mediterranean diet, with whole grains, fruits, vegetable, fish, lean meats, nuts, and olive oil. Limit salt. -recommend moderate walking, 3-5 times/week for 30-50 minutes each session. Aim for at least 150 minutes.week. Goal should be pace of 3 miles/hours, or walking 1.5 miles in 30 minutes -recommend avoidance of tobacco products. Avoid excess alcohol. -ASCVD risk score: The 10-year ASCVD risk score Mikey Bussing DC Brooke Bonito., et al., 2013) is: 17.7%   Values used to calculate the score:     Age: 65 years     Sex: Male     Is Non-Hispanic African American: No     Diabetic: Yes     Tobacco smoker: No     Systolic Blood Pressure: 498 mmHg     Is BP treated: Yes     HDL Cholesterol: 53.2 mg/dL     Total Cholesterol: 143 mg/dL    Plan for follow up: 1 month if he holds sinus rhythm, otherwise will need to have him seen sooner  Buford Dresser, MD, PhD, [facility]   Medication Adjustments/Labs and Tests Ordered: Current medicines are reviewed at length with the patient today.  Concerns regarding medicines are outlined above.  No orders of the defined types were placed in this encounter.  No orders of the defined types were placed in this encounter.   There are no Patient Instructions on file for this visit.  Signed, Buford Dresser, MD PhD 09/17/2020  [facility]

## 2020-09-17 NOTE — Patient Instructions (Signed)
Medication Instructions:  Your Physician recommend you continue on your current medication as directed.    *If you need a refill on your cardiac medications before your next appointment, please call your pharmacy*   Lab Work: None   Testing/Procedures: None   Follow-Up: At [facility], you and your health needs are our priority.  As part of our continuing mission to provide you with exceptional heart care, we have created designated Provider Care Teams.  These Care Teams include your primary Cardiologist (physician) and Advanced Practice Providers (APPs -  Physician Assistants and Nurse Practitioners) who all work together to provide you with the care you need, when you need it.  We recommend signing up for the patient portal called "MyChart".  Sign up information is provided on this After Visit Summary.  MyChart is used to connect with patients for Virtual Visits (Telemedicine).  Patients are able to view lab/test results, encounter notes, upcoming appointments, etc.  Non-urgent messages can be sent to your provider as well.   To learn more about what you can do with MyChart, go to [email or web address].    Your next appointment:   3 month(s) @ [address] [place], [place] [address]   The format for your next appointment:   In Person  Provider:   Bridgette Christopher, MD   

## 2020-09-27 ENCOUNTER — Other Ambulatory Visit (HOSPITAL_COMMUNITY): Payer: Self-pay | Admitting: Adult Health

## 2020-09-28 ENCOUNTER — Other Ambulatory Visit: Payer: Self-pay

## 2020-09-28 ENCOUNTER — Ambulatory Visit (INDEPENDENT_AMBULATORY_CARE_PROVIDER_SITE_OTHER): Payer: Medicare HMO

## 2020-09-28 DIAGNOSIS — I4891 Unspecified atrial fibrillation: Secondary | ICD-10-CM

## 2020-09-28 DIAGNOSIS — I513 Intracardiac thrombosis, not elsewhere classified: Secondary | ICD-10-CM | POA: Diagnosis not present

## 2020-09-28 LAB — POCT INR: INR: 3 (ref 2.0–3.0)

## 2020-09-28 NOTE — Patient Instructions (Addendum)
Description   Continue on same dosage 1.5 tablets of warfarin daily except for 1 tablet on Mondays, Wednesdays and Fridays. Recheck INR in 1 week. A Fib ablation 5/5; weekly INR checks. Coumadin Clinic [phone].

## 2020-10-05 ENCOUNTER — Other Ambulatory Visit: Payer: Self-pay | Admitting: Internal Medicine

## 2020-10-05 ENCOUNTER — Other Ambulatory Visit: Payer: Self-pay

## 2020-10-05 ENCOUNTER — Ambulatory Visit (INDEPENDENT_AMBULATORY_CARE_PROVIDER_SITE_OTHER): Payer: Medicare HMO | Admitting: *Deleted

## 2020-10-05 DIAGNOSIS — Z5181 Encounter for therapeutic drug level monitoring: Secondary | ICD-10-CM | POA: Diagnosis not present

## 2020-10-05 DIAGNOSIS — I4891 Unspecified atrial fibrillation: Secondary | ICD-10-CM | POA: Diagnosis not present

## 2020-10-05 DIAGNOSIS — I513 Intracardiac thrombosis, not elsewhere classified: Secondary | ICD-10-CM

## 2020-10-05 LAB — POCT INR: INR: 2.7 (ref 2.0–3.0)

## 2020-10-05 NOTE — Patient Instructions (Signed)
Description   Continue on same dosage 1.5 tablets of warfarin daily except for 1 tablet on Mondays, Wednesdays and Fridays. Recheck INR in 1 week. A Fib ablation 5/5; weekly INR checks. Coumadin Clinic [phone].       

## 2020-10-07 ENCOUNTER — Other Ambulatory Visit: Payer: Self-pay | Admitting: Internal Medicine

## 2020-10-08 NOTE — Telephone Encounter (Signed)
Prescription refill request received for warfarin Lov:  [name], 09/17/2020 Next INR check: 4/29 Warfarin tablet strength: 5mg 

## 2020-10-10 ENCOUNTER — Telehealth (HOSPITAL_COMMUNITY): Payer: Self-pay | Admitting: Pharmacy Technician

## 2020-10-10 ENCOUNTER — Telehealth: Payer: Self-pay | Admitting: Internal Medicine

## 2020-10-10 NOTE — Telephone Encounter (Signed)
Advanced Heart Failure Patient Advocate Encounter  Called [employer] to check the status of the patient's application. Representative stated that the application was still pending. She sent the processing team a message to assist in this process.  Will follow up.

## 2020-10-10 NOTE — Telephone Encounter (Signed)
[name] called re: [name] spoke with [employer] re: Freestyle Libre 14 day Sensors. [employer] told [name] they faxed forms today to Dr. Cruzita Lederer for Written Order Request and Clinical Notes  [name] requests to be called at ph# [phone] once the above forms have been received.

## 2020-10-11 ENCOUNTER — Encounter (HOSPITAL_COMMUNITY): Payer: Self-pay | Admitting: Pharmacy Technician

## 2020-10-11 NOTE — Telephone Encounter (Addendum)
Advanced Heart Failure Patient Advocate Encounter   Patient was approved to receive Entresto from [employer]  Patient ID: [id number] Effective dates: 10/11/20 through 06/15/21  Called and spoke with the patient. Sent a MyChart message with the phone number to [employer], as requested.   [name] Boxer, CPhT

## 2020-10-12 ENCOUNTER — Ambulatory Visit (INDEPENDENT_AMBULATORY_CARE_PROVIDER_SITE_OTHER): Payer: Medicare HMO | Admitting: *Deleted

## 2020-10-12 ENCOUNTER — Other Ambulatory Visit: Payer: Self-pay

## 2020-10-12 DIAGNOSIS — Z5181 Encounter for therapeutic drug level monitoring: Secondary | ICD-10-CM | POA: Diagnosis not present

## 2020-10-12 DIAGNOSIS — I4891 Unspecified atrial fibrillation: Secondary | ICD-10-CM

## 2020-10-12 DIAGNOSIS — I513 Intracardiac thrombosis, not elsewhere classified: Secondary | ICD-10-CM | POA: Diagnosis not present

## 2020-10-12 LAB — POCT INR: INR: 2.1 (ref 2.0–3.0)

## 2020-10-12 NOTE — Patient Instructions (Addendum)
Description   Today take 1.5 tablets then continue taking 1.5 tablets of warfarin daily except for 1 tablet on Mondays, Wednesdays and Fridays. Recheck INR in 1 week. A Fib ablation/TEE on 5/5; weekly INR checks. Coumadin Clinic [phone].

## 2020-10-16 ENCOUNTER — Ambulatory Visit (INDEPENDENT_AMBULATORY_CARE_PROVIDER_SITE_OTHER): Payer: Medicare HMO | Admitting: *Deleted

## 2020-10-16 ENCOUNTER — Other Ambulatory Visit: Payer: Self-pay

## 2020-10-16 ENCOUNTER — Other Ambulatory Visit (HOSPITAL_COMMUNITY)
Admission: RE | Admit: 2020-10-16 | Discharge: 2020-10-16 | Disposition: A | Discharge status: Home / Self Care | Payer: Medicare HMO | Source: Ambulatory Visit | Attending: Cardiology | Admitting: Cardiology

## 2020-10-16 DIAGNOSIS — Z01812 Encounter for preprocedural laboratory examination: Secondary | ICD-10-CM | POA: Diagnosis not present

## 2020-10-16 DIAGNOSIS — Z5181 Encounter for therapeutic drug level monitoring: Secondary | ICD-10-CM

## 2020-10-16 DIAGNOSIS — Z20822 Contact with and (suspected) exposure to covid-19: Secondary | ICD-10-CM | POA: Insufficient documentation

## 2020-10-16 DIAGNOSIS — I513 Intracardiac thrombosis, not elsewhere classified: Secondary | ICD-10-CM

## 2020-10-16 DIAGNOSIS — I4891 Unspecified atrial fibrillation: Secondary | ICD-10-CM | POA: Diagnosis not present

## 2020-10-16 LAB — POCT INR: INR: 2.8 (ref 2.0–3.0)

## 2020-10-16 LAB — SARS CORONAVIRUS 2 (TAT 6-24 HRS): SARS Coronavirus 2: NEGATIVE

## 2020-10-16 NOTE — Telephone Encounter (Signed)
Forms were faxed over last week

## 2020-10-16 NOTE — Telephone Encounter (Signed)
Pt was calling back to f/u on last message

## 2020-10-16 NOTE — Telephone Encounter (Signed)
[employer] called stating they never received the fax last week - requested to please resend them to fax# [phone]

## 2020-10-16 NOTE — Patient Instructions (Signed)
Description   Continue taking 1.5 tablets of warfarin daily except for 1 tablet on Mondays, Wednesdays and Fridays. Recheck INR in 1 week post ablation. Coumadin Clinic [phone].

## 2020-10-17 NOTE — Telephone Encounter (Signed)
Forms faxed to [phone]

## 2020-10-18 ENCOUNTER — Encounter (HOSPITAL_COMMUNITY)
Admission: RE | Disposition: A | Discharge status: Home / Self Care | Payer: Medicare HMO | Source: Home / Self Care | Attending: Cardiology

## 2020-10-18 ENCOUNTER — Encounter (HOSPITAL_COMMUNITY): Payer: Self-pay | Admitting: Cardiology

## 2020-10-18 ENCOUNTER — Ambulatory Visit (HOSPITAL_COMMUNITY): Payer: Medicare HMO | Admitting: Anesthesiology

## 2020-10-18 ENCOUNTER — Ambulatory Visit (HOSPITAL_BASED_OUTPATIENT_CLINIC_OR_DEPARTMENT_OTHER)
Admission: RE | Admit: 2020-10-18 | Discharge: 2020-10-18 | Disposition: A | Discharge status: Home / Self Care | Payer: Medicare HMO | Source: Home / Self Care | Attending: Cardiology | Admitting: Cardiology

## 2020-10-18 ENCOUNTER — Ambulatory Visit (HOSPITAL_COMMUNITY)
Admission: RE | Admit: 2020-10-18 | Discharge: 2020-10-18 | Disposition: A | Discharge status: Home / Self Care | Payer: Medicare HMO | Attending: Cardiology | Admitting: Cardiology

## 2020-10-18 DIAGNOSIS — J302 Other seasonal allergic rhinitis: Secondary | ICD-10-CM | POA: Diagnosis not present

## 2020-10-18 DIAGNOSIS — Z79899 Other long term (current) drug therapy: Secondary | ICD-10-CM | POA: Diagnosis not present

## 2020-10-18 DIAGNOSIS — Z7984 Long term (current) use of oral hypoglycemic drugs: Secondary | ICD-10-CM | POA: Diagnosis not present

## 2020-10-18 DIAGNOSIS — I4891 Unspecified atrial fibrillation: Secondary | ICD-10-CM | POA: Diagnosis not present

## 2020-10-18 DIAGNOSIS — Z7951 Long term (current) use of inhaled steroids: Secondary | ICD-10-CM | POA: Insufficient documentation

## 2020-10-18 DIAGNOSIS — I340 Nonrheumatic mitral (valve) insufficiency: Secondary | ICD-10-CM | POA: Diagnosis not present

## 2020-10-18 DIAGNOSIS — Z79890 Hormone replacement therapy (postmenopausal): Secondary | ICD-10-CM | POA: Insufficient documentation

## 2020-10-18 DIAGNOSIS — Q211 Atrial septal defect: Secondary | ICD-10-CM | POA: Insufficient documentation

## 2020-10-18 DIAGNOSIS — Z794 Long term (current) use of insulin: Secondary | ICD-10-CM | POA: Insufficient documentation

## 2020-10-18 DIAGNOSIS — I081 Rheumatic disorders of both mitral and tricuspid valves: Secondary | ICD-10-CM | POA: Insufficient documentation

## 2020-10-18 DIAGNOSIS — I4819 Other persistent atrial fibrillation: Secondary | ICD-10-CM | POA: Insufficient documentation

## 2020-10-18 DIAGNOSIS — I361 Nonrheumatic tricuspid (valve) insufficiency: Secondary | ICD-10-CM

## 2020-10-18 DIAGNOSIS — Z7901 Long term (current) use of anticoagulants: Secondary | ICD-10-CM | POA: Diagnosis not present

## 2020-10-18 DIAGNOSIS — I110 Hypertensive heart disease with heart failure: Secondary | ICD-10-CM | POA: Diagnosis not present

## 2020-10-18 DIAGNOSIS — I5023 Acute on chronic systolic (congestive) heart failure: Secondary | ICD-10-CM | POA: Diagnosis not present

## 2020-10-18 DIAGNOSIS — I5022 Chronic systolic (congestive) heart failure: Secondary | ICD-10-CM | POA: Diagnosis not present

## 2020-10-18 HISTORY — PX: TEE WITHOUT CARDIOVERSION: SHX5443

## 2020-10-18 HISTORY — PX: ATRIAL FIBRILLATION ABLATION: EP1191

## 2020-10-18 LAB — CBC
HCT: 56 % — ABNORMAL HIGH (ref 39.0–52.0)
Hemoglobin: 18.2 g/dL — ABNORMAL HIGH (ref 13.0–17.0)
MCH: 28.8 pg (ref 26.0–34.0)
MCHC: 32.5 g/dL (ref 30.0–36.0)
MCV: 88.6 fL (ref 80.0–100.0)
Platelets: 165 10*3/uL (ref 150–400)
RBC: 6.32 MIL/uL — ABNORMAL HIGH (ref 4.22–5.81)
RDW: 17.4 % — ABNORMAL HIGH (ref 11.5–15.5)
WBC: 7.2 10*3/uL (ref 4.0–10.5)
nRBC: 0 % (ref 0.0–0.2)

## 2020-10-18 LAB — BASIC METABOLIC PANEL
Anion gap: 6 (ref 5–15)
BUN: 21 mg/dL (ref 8–23)
CO2: 28 mmol/L (ref 22–32)
Calcium: 8.8 mg/dL — ABNORMAL LOW (ref 8.9–10.3)
Chloride: 105 mmol/L (ref 98–111)
Creatinine, Ser: 1.49 mg/dL — ABNORMAL HIGH (ref 0.61–1.24)
GFR, Estimated: 52 mL/min — ABNORMAL LOW (ref >60)
Glucose, Bld: 120 mg/dL — ABNORMAL HIGH (ref 70–99)
Potassium: 4.1 mmol/L (ref 3.5–5.1)
Sodium: 139 mmol/L (ref 135–145)

## 2020-10-18 LAB — POCT I-STAT, CHEM 8
BUN: 24 mg/dL — ABNORMAL HIGH (ref 8–23)
Calcium, Ion: 1.21 mmol/L (ref 1.15–1.40)
Chloride: 104 mmol/L (ref 98–111)
Creatinine, Ser: 1.4 mg/dL — ABNORMAL HIGH (ref 0.61–1.24)
Glucose, Bld: 121 mg/dL — ABNORMAL HIGH (ref 70–99)
HCT: 54 % — ABNORMAL HIGH (ref 39.0–52.0)
Hemoglobin: 18.4 g/dL — ABNORMAL HIGH (ref 13.0–17.0)
Potassium: 4.3 mmol/L (ref 3.5–5.1)
Sodium: 142 mmol/L (ref 135–145)
TCO2: 26 mmol/L (ref 22–32)

## 2020-10-18 LAB — PROTIME-INR
INR: 2.5 — ABNORMAL HIGH (ref 0.8–1.2)
Prothrombin Time: 26.5 seconds — ABNORMAL HIGH (ref 11.4–15.2)

## 2020-10-18 LAB — POCT ACTIVATED CLOTTING TIME
Activated Clotting Time: 356 seconds
Activated Clotting Time: 333 seconds
Activated Clotting Time: 350 seconds

## 2020-10-18 LAB — GLUCOSE, CAPILLARY
Glucose-Capillary: 135 mg/dL — ABNORMAL HIGH (ref 70–99)
Glucose-Capillary: 128 mg/dL — ABNORMAL HIGH (ref 70–99)

## 2020-10-18 SURGERY — ECHOCARDIOGRAM, TRANSESOPHAGEAL
Anesthesia: Monitor Anesthesia Care

## 2020-10-18 SURGERY — ATRIAL FIBRILLATION ABLATION
Anesthesia: General

## 2020-10-18 MED ORDER — FENTANYL CITRATE (PF) 100 MCG/2ML IJ SOLN: 25.0000 ug | INTRAMUSCULAR | Status: DC | PRN

## 2020-10-18 MED ORDER — HYDROCODONE-ACETAMINOPHEN 5-325 MG PO TABS: 1.0000 | ORAL_TABLET | ORAL | Status: DC | PRN

## 2020-10-18 MED ORDER — PROPOFOL 500 MG/50ML IV EMUL
INTRAVENOUS | Status: DC | PRN
  Administered 2020-10-18: 115 ug/kg/min via INTRAVENOUS

## 2020-10-18 MED ORDER — LIDOCAINE 2% (20 MG/ML) 5 ML SYRINGE
INTRAMUSCULAR | Status: DC | PRN
  Administered 2020-10-18: 100 mg via INTRAVENOUS

## 2020-10-18 MED ORDER — ONDANSETRON HCL 4 MG/2ML IJ SOLN
INTRAMUSCULAR | Status: DC | PRN
  Administered 2020-10-18: 4 mg via INTRAVENOUS

## 2020-10-18 MED ORDER — DEXAMETHASONE SODIUM PHOSPHATE 10 MG/ML IJ SOLN
INTRAMUSCULAR | Status: DC | PRN
  Administered 2020-10-18: 5 mg via INTRAVENOUS

## 2020-10-18 MED ORDER — HEPARIN SODIUM (PORCINE) 1000 UNIT/ML IJ SOLN
INTRAMUSCULAR | Status: AC
  Filled 2020-10-18: qty 2

## 2020-10-18 MED ORDER — FENTANYL CITRATE (PF) 250 MCG/5ML IJ SOLN
INTRAMUSCULAR | Status: DC | PRN
  Administered 2020-10-18: 100 ug via INTRAVENOUS

## 2020-10-18 MED ORDER — HEPARIN (PORCINE) IN NACL 1000-0.9 UT/500ML-% IV SOLN
INTRAVENOUS | Status: DC | PRN
  Administered 2020-10-18: 500 mL

## 2020-10-18 MED ORDER — PROPOFOL 10 MG/ML IV BOLUS
INTRAVENOUS | Status: DC | PRN
  Administered 2020-10-18 (×2): 20 mg via INTRAVENOUS

## 2020-10-18 MED ORDER — PHENYLEPHRINE HCL-NACL 10-0.9 MG/250ML-% IV SOLN
INTRAVENOUS | Status: DC | PRN
  Administered 2020-10-18: 40 ug/min via INTRAVENOUS

## 2020-10-18 MED ORDER — SODIUM CHLORIDE 0.9 % IV SOLN: INTRAVENOUS | Status: DC

## 2020-10-18 MED ORDER — HEPARIN SODIUM (PORCINE) 1000 UNIT/ML IJ SOLN
INTRAMUSCULAR | Status: DC | PRN
  Administered 2020-10-18: 1000 [IU] via INTRAVENOUS
  Administered 2020-10-18: 15000 [IU] via INTRAVENOUS

## 2020-10-18 MED ORDER — ACETAMINOPHEN 325 MG PO TABS: 650.0000 mg | ORAL_TABLET | ORAL | Status: DC | PRN

## 2020-10-18 MED ORDER — PROPOFOL 10 MG/ML IV BOLUS
INTRAVENOUS | Status: DC | PRN
  Administered 2020-10-18: 180 mg via INTRAVENOUS

## 2020-10-18 MED ORDER — SODIUM CHLORIDE 0.9% FLUSH: 3.0000 mL | Freq: Two times a day (BID) | INTRAVENOUS | Status: DC

## 2020-10-18 MED ORDER — ROCURONIUM BROMIDE 10 MG/ML (PF) SYRINGE
PREFILLED_SYRINGE | INTRAVENOUS | Status: DC | PRN
  Administered 2020-10-18: 30 mg via INTRAVENOUS
  Administered 2020-10-18: 70 mg via INTRAVENOUS
  Administered 2020-10-18: 30 mg via INTRAVENOUS

## 2020-10-18 MED ORDER — ACETAMINOPHEN 10 MG/ML IV SOLN
1000.0000 mg | Freq: Once | INTRAVENOUS | Status: DC | PRN
Start: 2020-10-18 — End: 2020-10-18
  Filled 2020-10-18: qty 100

## 2020-10-18 MED ORDER — ONDANSETRON HCL 4 MG/2ML IJ SOLN: 4.0000 mg | Freq: Four times a day (QID) | INTRAMUSCULAR | Status: DC | PRN

## 2020-10-18 MED ORDER — HEPARIN SODIUM (PORCINE) 1000 UNIT/ML IJ SOLN
INTRAMUSCULAR | Status: DC | PRN
  Administered 2020-10-18: 1000 [IU] via INTRAVENOUS
  Administered 2020-10-18: 2000 [IU] via INTRAVENOUS

## 2020-10-18 MED ORDER — HEPARIN (PORCINE) IN NACL 1000-0.9 UT/500ML-% IV SOLN
INTRAVENOUS | Status: AC
  Filled 2020-10-18: qty 500

## 2020-10-18 MED ORDER — SODIUM CHLORIDE 0.9% FLUSH: 3.0000 mL | INTRAVENOUS | Status: DC | PRN

## 2020-10-18 MED ORDER — SODIUM CHLORIDE 0.9 % IV SOLN: 250.0000 mL | INTRAVENOUS | Status: DC | PRN

## 2020-10-18 MED ORDER — AMIODARONE HCL 200 MG PO TABS: 200.0000 mg | ORAL_TABLET | Freq: Every day | ORAL | Status: DC

## 2020-10-18 MED ORDER — SUGAMMADEX SODIUM 200 MG/2ML IV SOLN
INTRAVENOUS | Status: DC | PRN
  Administered 2020-10-18: 200 mg via INTRAVENOUS

## 2020-10-18 MED ORDER — PROMETHAZINE HCL 25 MG/ML IJ SOLN
6.2500 mg | INTRAMUSCULAR | Status: DC | PRN
  Filled 2020-10-18: qty 1

## 2020-10-18 MED ORDER — LIDOCAINE 2% (20 MG/ML) 5 ML SYRINGE
INTRAMUSCULAR | Status: DC | PRN
  Administered 2020-10-18: 60 mg via INTRAVENOUS

## 2020-10-18 MED ORDER — MIDAZOLAM HCL 5 MG/5ML IJ SOLN
INTRAMUSCULAR | Status: DC | PRN
  Administered 2020-10-18: 2 mg via INTRAVENOUS

## 2020-10-18 MED ORDER — PANTOPRAZOLE SODIUM 40 MG PO TBEC: 40.0000 mg | DELAYED_RELEASE_TABLET | Freq: Every day | ORAL | 0 refills | Status: DC

## 2020-10-18 MED ORDER — PROTAMINE SULFATE 10 MG/ML IV SOLN
INTRAVENOUS | Status: DC | PRN
  Administered 2020-10-18: 40 mg via INTRAVENOUS

## 2020-10-18 MED ORDER — PHENYLEPHRINE 40 MCG/ML (10ML) SYRINGE FOR IV PUSH (FOR BLOOD PRESSURE SUPPORT)
PREFILLED_SYRINGE | INTRAVENOUS | Status: DC | PRN
  Administered 2020-10-18: 120 ug via INTRAVENOUS
  Administered 2020-10-18: 400 ug via INTRAVENOUS

## 2020-10-18 SURGICAL SUPPLY — 18 items
BLANKET WARM UNDERBOD FULL ACC (MISCELLANEOUS) ×2 IMPLANT
CATH MAPPNG PENTARAY F 2-6-2MM (CATHETERS) IMPLANT
CATH SMTCH THERMOCOOL SF DF (CATHETERS) ×1 IMPLANT
CATH SOUNDSTAR ECO 8FR (CATHETERS) ×1 IMPLANT
CATH WEBSTER BI DIR CS D-F CRV (CATHETERS) ×1 IMPLANT
CLOSURE PERCLOSE PROSTYLE (VASCULAR PRODUCTS) ×3 IMPLANT
COVER SWIFTLINK CONNECTOR (BAG) ×2 IMPLANT
MAT PREVALON FULL STRYKER (MISCELLANEOUS) ×1 IMPLANT
PACK EP LATEX FREE (CUSTOM PROCEDURE TRAY) ×2
PACK EP LF (CUSTOM PROCEDURE TRAY) ×1 IMPLANT
PAD PRO RADIOLUCENT 2001M-C (PAD) ×2 IMPLANT
PATCH CARTO3 (PAD) ×1 IMPLANT
PENTARAY F 2-6-2MM (CATHETERS) ×2
SHEATH PINNACLE 7F 10CM (SHEATH) ×1 IMPLANT
SHEATH PINNACLE 8F 10CM (SHEATH) ×1 IMPLANT
SHEATH PINNACLE 9F 10CM (SHEATH) ×1 IMPLANT
SHEATH PROBE COVER 6X72 (BAG) ×1 IMPLANT
TUBING SMART ABLATE COOLFLOW (TUBING) ×1 IMPLANT

## 2020-10-18 NOTE — Anesthesia Procedure Notes (Signed)
Procedure Name: MAC Date/Time: 10/18/2020 7:52 AM Performed by: Rande Brunt, CRNA Pre-anesthesia Checklist: Patient identified, Emergency Drugs available, Suction available and Patient being monitored Patient Re-evaluated:Patient Re-evaluated prior to induction Oxygen Delivery Method: Nasal cannula Preoxygenation: Pre-oxygenation with 100% oxygen Induction Type: IV induction Placement Confirmation: positive ETCO2 Dental Injury: Teeth and Oropharynx as per pre-operative assessment

## 2020-10-18 NOTE — Transfer of Care (Signed)
Immediate Anesthesia Transfer of Care Note  Patient: TRINTEN BOUDOIN  Procedure(s) Performed: TRANSESOPHAGEAL ECHOCARDIOGRAM (TEE) (N/A )  Patient Location: Endoscopy Unit  Anesthesia Type:MAC  Level of Consciousness: awake, alert , oriented and drowsy  Airway & Oxygen Therapy: Patient Spontanous Breathing  Post-op Assessment: Report given to RN, Post -op Vital signs reviewed and stable and Patient moving all extremities X 4  Post vital signs: Reviewed and stable  Last Vitals:  Vitals Value Taken Time  BP 127/79   Temp    Pulse 66   Resp    SpO2 97     Last Pain:  Vitals:   10/18/20 0720  TempSrc: Oral  PainSc: 0-No pain         Complications: No complications documented.

## 2020-10-18 NOTE — Interval H&P Note (Signed)
History and Physical Interval Note:  10/18/2020 7:45 AM  [name]  has presented today for surgery, with the diagnosis of AFIB.  The various methods of treatment have been discussed with the patient and family. After consideration of risks, benefits and other options for treatment, the patient has consented to  Procedure(s): TRANSESOPHAGEAL ECHOCARDIOGRAM (TEE) (N/A) as a surgical intervention.  The patient's history has been reviewed, patient examined, no change in status, stable for surgery.  I have reviewed the patient's chart and labs.  Questions were answered to the patient's satisfaction.     [employer]

## 2020-10-18 NOTE — H&P (Signed)
[name] is a 65 y.o. male who presents today for routine electrophysiology study and ablation for afib.  Since last being seen in our clinic, the patient reports doing reasonably well.   Today, he denies symptoms of palpitations, chest pain, shortness of breath,  lower extremity edema, dizziness, presyncope, or syncope.  The patient is otherwise without complaint today.       Past Medical History:  Diagnosis Date  . Asthma    as a teenager - does not use an inhaler although pt said he has an albuteral inhaler  . Atrial fibrillation ([place])    persistant 02/2009  . Atrial flutter ([place])    s/p CTI ablation 08/06/10  . Cataract   . Chronic rhinitis   . Colon polyp   . Congestive heart failure ([place])   . Diverticulosis    colonoscopy 04/03/2009  . Hyperlipidemia   . Hypertension   . Morbid obesity ([place])    target weight = 219  for BMI < 30  . Nonischemic cardiomyopathy ([facility])    tachycardia mediated  . Seasonal allergies   . Sleep apnea    original 2001 - wears C-PaP  . Type 2 diabetes mellitus ([place])         Past Surgical History:  Procedure Laterality Date  . ATRIAL FIBRILLATION ABLATION N/A 12/14/2018   Procedure: ATRIAL FIBRILLATION ABLATION;  Surgeon: Thompson Grayer, MD;  Location: [place] [facility];  Service: Cardiovascular;  Laterality: N/A;  . atrial flutter ablation  08/06/10  . CARDIOVERSION N/A 07/05/2018   Procedure: CARDIOVERSION;  Surgeon: Buford Dresser, MD;  Location: [facility] ENDOSCOPY;  Service: Cardiovascular;  Laterality: N/A;  . CARDIOVERSION N/A 08/08/2020   Procedure: CARDIOVERSION;  Surgeon: Larey Dresser, MD;  Location: [facility] ENDOSCOPY;  Service: Cardiovascular;  Laterality: N/A;  . CARDIOVERSION N/A 08/29/2020   Procedure: CARDIOVERSION;  Surgeon: Jolaine Artist, MD;  Location: [facility] ENDOSCOPY;  Service: Cardiovascular;  Laterality: N/A;  . COLONOSCOPY    . POLYPECTOMY    . RIGHT HEART CATH N/A 08/06/2020   Procedure:  RIGHT HEART CATH;  Surgeon: Larey Dresser, MD;  Location: [place] [facility];  Service: Cardiovascular;  Laterality: N/A;  . TEE WITHOUT CARDIOVERSION N/A 12/14/2018   Procedure: TRANSESOPHAGEAL ECHOCARDIOGRAM (TEE);  Surgeon: Thompson Grayer, MD;  Location: [place] [facility];  Service: Cardiovascular;  Laterality: N/A;  . TEE WITHOUT CARDIOVERSION N/A 08/03/2020   Procedure: TRANSESOPHAGEAL ECHOCARDIOGRAM (TEE);  Surgeon: Sanda Klein, MD;  Location: [place];  Service: Cardiovascular;  Laterality: N/A;  . TEE WITHOUT CARDIOVERSION N/A 08/08/2020   Procedure: TRANSESOPHAGEAL ECHOCARDIOGRAM (TEE);  Surgeon: Larey Dresser, MD;  Location: [facility] ENDOSCOPY;  Service: Cardiovascular;  Laterality: N/A;    ROS- all systems are reviewed and negatives except as per HPI above        Current Outpatient Medications  Medication Sig Dispense Refill  . albuterol (PROVENTIL HFA;VENTOLIN HFA) 108 (90 Base) MCG/ACT inhaler Inhale 2 puffs into the lungs every 6 (six) hours as needed for wheezing or shortness of breath.    [name] amiodarone (PACERONE) 200 MG tablet Take 1 tablet 2 times a day for 2 weeks then reduce to 1 tablet daily 60 tablet 1  . B-D UF III MINI PEN NEEDLES 31G X 5 MM MISC 1 EACH BY OTHER ROUTE 4 (FOUR) TIMES DAILY. E11.9 100 each 4  . carvedilol (COREG) 3.125 MG tablet Take 1 tablet (3.125 mg total) by mouth 2 (two) times daily with a meal. 60 tablet 5  .  Continuous Blood Gluc Receiver (FREESTYLE LIBRE 14 DAY READER) DEVI USE AS DIRECTED 1 each 11  . Continuous Blood Gluc Sensor (FREESTYLE LIBRE 14 DAY SENSOR) MISC Use as directed by Diabetes educator 2 each 11  . digoxin (LANOXIN) 0.125 MG tablet Take 0.5 tablets (0.0625 mg total) by mouth daily. 15 tablet 5  . doxylamine, Sleep, (UNISOM) 25 MG tablet Take 50 mg by mouth at bedtime as needed for sleep.     . empagliflozin (JARDIANCE) 10 MG TABS tablet Take 1 tablet (10 mg total) by mouth daily. 30 tablet 5  . furosemide (LASIX) 40  MG tablet Take 1 tablet (40 mg total) by mouth daily as needed. 30 tablet 5  . hydrALAZINE (APRESOLINE) 50 MG tablet Take 50 mg by mouth 2 (two) times daily.    . insulin aspart (NOVOLOG) 100 UNIT/ML FlexPen Inject 8-12 Units into the skin 3 (three) times daily with meals. 30 mL 3  . insulin degludec (TRESIBA FLEXTOUCH) 200 UNIT/ML FlexTouch Pen Inject 40 Units into the skin daily. 9 mL 3  . isosorbide mononitrate (IMDUR) 60 MG 24 hr tablet Take 1 tablet (60 mg total) by mouth daily. 30 tablet 5  . levothyroxine (SYNTHROID) 50 MCG tablet TAKE 1 TABLET BY MOUTH EVERY DAY BEFORE BREAKFAST 90 tablet 0  . mometasone (NASONEX) 50 MCG/ACT nasal spray Place 2 sprays into the nose daily as needed (allergies or rhinitis).    . Multiple Vitamins-Minerals (MULTIVITAMIN WITH MINERALS) tablet Take 2 tablets by mouth at bedtime. Chewable    . OZEMPIC, 0.25 OR 0.5 MG/DOSE, 2 MG/1.5ML SOPN INJECT 0.5 MG INTO THE SKIN ONCE A WEEK. 1.5 mL 5  . sacubitril-valsartan (ENTRESTO) 24-26 MG Take 1 tablet by mouth 2 (two) times daily. 60 tablet 3  . spironolactone (ALDACTONE) 25 MG tablet Take 1 tablet (25 mg total) by mouth daily. 30 tablet 5  . SYMBICORT 80-4.5 MCG/ACT inhaler USE 2 INHALATIONS TWICE A DAY 30.6 g 1  . thiamine 100 MG tablet Take 1 tablet (100 mg total) by mouth daily. 30 tablet 1  . vitamin C (ASCORBIC ACID) 500 MG tablet Take 500 mg by mouth in the morning.    . warfarin (COUMADIN) 5 MG tablet Take 1 tablet to 1 and 1/2 tablets by mouth daily as directed by the coumadin clinic. 50 tablet 0   No current facility-administered medications for this visit.    Vitals:   10/18/20 0720  BP: (!) 145/90  Pulse: 62  Resp: 17  Temp: 98.2 F (36.8 C)  SpO2: 98%    GEN- The patient is well appearing, alert and oriented x 3 today.   Head- normocephalic, atraumatic Eyes-  Sclera clear, conjunctiva pink Ears- hearing intact Oropharynx- clear Lungs-  normal work of breathing Heart- irregular  rate and rhythm  GI- soft  Extremities- no clubbing, cyanosis, or edema   Assessment and Plan:  1. Persistent atrial fibrillation The patient has symptomatic, recurrent atrial fibrillation. He is s/p CTI ablation 2012.  He has severe LA enlargement he has failed medical therapy with amiodarone. Chads2vasc score is 3.  he is anticoagulated with coumadin.  Risk, benefits, and alternatives to EP study and radiofrequency ablation for afib were also discussed in detail today. These risks include but are not limited to stroke, bleeding, vascular damage, tamponade, perforation, damage to the esophagus, lungs, and other structures, pulmonary vein stenosis, worsening renal function, and death. The patient understands these risk and wishes to proceed.   Thompson Grayer MD, [place]  10/18/2020 7:34 AM

## 2020-10-18 NOTE — Progress Notes (Signed)
Received patient from Endo alert and oriented X4, skin warm and dry, no complaint of any discomfort.  IVF NS startred at 50cc/hr.  Pt waiting for EP study. Consent form signed.

## 2020-10-18 NOTE — Discharge Instructions (Signed)
Femoral Site Care  This sheet gives you information about how to care for yourself after your procedure. Your health care provider may also give you more specific instructions. If you have problems or questions, contact your health care provider. What can I expect after the procedure? After the procedure, it is common to have:  Bruising that usually fades within 1-2 weeks.  Tenderness at the site. Follow these instructions at home: Wound care  Follow instructions from your health care provider about how to take care of your insertion site. Make sure you: ? Wash your hands with soap and water before you change your bandage (dressing). If soap and water are not available, use hand sanitizer. ? Change your dressing as told by your health care provider. ? Leave stitches (sutures), skin glue, or adhesive strips in place. These skin closures may need to stay in place for 2 weeks or longer. If adhesive strip edges start to loosen and curl up, you may trim the loose edges. Do not remove adhesive strips completely unless your health care provider tells you to do that.  Do not take baths, swim, or use a hot tub until your health care provider approves.  You may shower 24-48 hours after the procedure or as told by your health care provider. ? Gently wash the site with plain soap and water. ? Pat the area dry with a clean towel. ? Do not rub the site. This may cause bleeding.  Do not apply powder or lotion to the site. Keep the site clean and dry.  Check your femoral site every day for signs of infection. Check for: ? Redness, swelling, or pain. ? Fluid or blood. ? Warmth. ? Pus or a bad smell. Activity  For the first 2-3 days after your procedure, or as long as directed: ? Avoid climbing stairs as much as possible. ? Do not squat.  Do not lift anything that is heavier than 10 lb (4.5 kg), or the limit that you are told, until your health care provider says that it is safe.  Rest as  directed. ? Avoid sitting for a long time without moving. Get up to take short walks every 1-2 hours.  Do not drive for 24 hours if you were given a medicine to help you relax (sedative). General instructions  Take over-the-counter and prescription medicines only as told by your health care provider.  Keep all follow-up visits as told by your health care provider. This is important. Contact a health care provider if you have:  A fever or chills.  You have redness, swelling, or pain around your insertion site. Get help right away if:  The catheter insertion area swells very fast.  You pass out.  You suddenly start to sweat or your skin gets clammy.  The catheter insertion area is bleeding, and the bleeding does not stop when you hold steady pressure on the area.  The area near or just beyond the catheter insertion site becomes pale, cool, tingly, or numb. These symptoms may represent a serious problem that is an emergency. Do not wait to see if the symptoms will go away. Get medical help right away. Call your local emergency services (911 in the [place] Do not drive yourself to the hospital. Summary  After the procedure, it is common to have bruising that usually fades within 1-2 weeks.  Check your femoral site every day for signs of infection.  Do not lift anything that is heavier than 10 lb (4.5 kg), or   the limit that you are told, until your health care provider says that it is safe. This information is not intended to replace advice given to you by your health care provider. Make sure you discuss any questions you have with your health care provider. Document Revised: 02/03/2020 Document Reviewed: 02/03/2020 Elsevier Patient Education  2021 Elsevier Inc.  

## 2020-10-18 NOTE — CV Procedure (Signed)
   Transesophageal Echocardiogram  Indications: Preatrial fibrillation ablation, prior left atrial appendage thrombus  Time out performed Propofol administered by anesthesia.   Findings:  Left Ventricle: EF approximately 30%, moderately reduced  Mitral Valve: Mild mitral regurgitation posteriorly directed  Aortic Valve: Normal, no regurgitation  Tricuspid Valve: Moderate tricuspid regurgitation, estimated pulmonary pressure approximately 30 mmHg  Left Atrium: No left atrial appendage thrombus.  Dilated left atrium.  Small color-flow Doppler signal at intra-atrial septum indicative of small PFO.  Impression: No evidence of left atrial appendage thrombus.  Okay to proceed with ablation.  Candee Furbish, MD

## 2020-10-18 NOTE — Anesthesia Postprocedure Evaluation (Signed)
Anesthesia Post Note  Patient: [name]  Procedure(s) Performed: TRANSESOPHAGEAL ECHOCARDIOGRAM (TEE) (N/A )     Patient location during evaluation: Endoscopy Anesthesia Type: MAC Level of consciousness: awake Pain management: pain level controlled Vital Signs Assessment: post-procedure vital signs reviewed and stable Respiratory status: spontaneous breathing Cardiovascular status: stable Postop Assessment: no apparent nausea or vomiting Anesthetic complications: no   No complications documented.  Last Vitals:  Vitals:   10/18/20 0900 10/18/20 0910  BP: 137/74 131/83  Pulse: 60 61  Resp: 17 15  Temp:    SpO2: 92% 94%    Last Pain:  Vitals:   10/18/20 0910  TempSrc:   PainSc: 0-No pain                 Tinisha Etzkorn

## 2020-10-18 NOTE — Anesthesia Procedure Notes (Signed)
Procedure Name: Intubation Date/Time: 10/18/2020 10:20 AM Performed by: Kyung Rudd, CRNA Pre-anesthesia Checklist: Patient identified, Emergency Drugs available, Suction available and Patient being monitored Patient Re-evaluated:Patient Re-evaluated prior to induction Oxygen Delivery Method: Circle system utilized Preoxygenation: Pre-oxygenation with 100% oxygen Induction Type: IV induction Ventilation: Mask ventilation without difficulty and Oral airway inserted - appropriate to patient size Laryngoscope Size: Mac and 4 Grade View: Grade II Tube type: Oral Tube size: 7.5 mm Number of attempts: 1 Airway Equipment and Method: Stylet Placement Confirmation: ETT inserted through vocal cords under direct vision,  positive ETCO2 and breath sounds checked- equal and bilateral Secured at: 21 cm Tube secured with: Tape Dental Injury: Teeth and Oropharynx as per pre-operative assessment

## 2020-10-18 NOTE — Transfer of Care (Signed)
Immediate Anesthesia Transfer of Care Note  Patient: [name]  Procedure(s) Performed: ATRIAL FIBRILLATION ABLATION (N/A )  Patient Location: Cath Lab  Anesthesia Type:General   Level of Consciousness: awake, alert  and oriented  Airway & Oxygen Therapy: Patient Spontanous Breathing and Patient connected to nasal cannula oxygen  Post-op Assessment: Report given to RN, Post -op Vital signs reviewed and stable and Patient moving all extremities  Post vital signs: Reviewed and stable  Last Vitals:  Vitals Value Taken Time  BP 130/89 10/18/20 1255  Temp 36.3 C 10/18/20 1254  Pulse 57 10/18/20 1257  Resp 14 10/18/20 1257  SpO2 97 % 10/18/20 1257  Vitals shown include unvalidated device data.  Last Pain:  Vitals:   10/18/20 1254  TempSrc: Temporal  PainSc: 0-No pain         Complications: No complications documented.

## 2020-10-18 NOTE — Progress Notes (Signed)
Patient's BP was 156/15 and diastolic was consistently in the 90s. Writer notified PA Jonni Sanger because patient didn't have PRN orders for elevated blood pressures. PA acknowledged blood pressure readings but stated that patient can resume blood pressure medications when discharged. Will continue to monitor.

## 2020-10-18 NOTE — Anesthesia Preprocedure Evaluation (Addendum)
Anesthesia Evaluation  Patient identified by MRN, date of birth, ID band Patient awake    Reviewed: Allergy & Precautions, NPO status , Patient's Chart, lab work & pertinent test results  Airway Mallampati: II  TM Distance: >3 FB     Dental   Pulmonary asthma , sleep apnea ,    breath sounds clear to auscultation       Cardiovascular hypertension, +CHF   Rhythm:Regular Rate:Normal     Neuro/Psych    GI/Hepatic negative GI ROS, Neg liver ROS,   Endo/Other  diabetesHypothyroidism   Renal/GU negative Renal ROS     Musculoskeletal   Abdominal   Peds  Hematology   Anesthesia Other Findings   Reproductive/Obstetrics                             Anesthesia Physical Anesthesia Plan  ASA: III  Anesthesia Plan: General   Post-op Pain Management:    Induction: Intravenous  PONV Risk Score and Plan: 3  Airway Management Planned: Nasal Cannula and Simple Face Mask  Additional Equipment:   Intra-op Plan:   Post-operative Plan:   Informed Consent: I have reviewed the patients History and Physical, chart, labs and discussed the procedure including the risks, benefits and alternatives for the proposed anesthesia with the patient or authorized representative who has indicated his/her understanding and acceptance.     Dental advisory given  Plan Discussed with: CRNA and Anesthesiologist  Anesthesia Plan Comments:         Anesthesia Quick Evaluation

## 2020-10-18 NOTE — Progress Notes (Signed)
  Echocardiogram Echocardiogram Transesophageal has been performed.  [name] 10/18/2020, 9:22 AM

## 2020-10-18 NOTE — Anesthesia Preprocedure Evaluation (Addendum)
Anesthesia Evaluation  Patient identified by MRN, date of birth, ID band Patient awake    Reviewed: Allergy & Precautions, NPO status , Patient's Chart, lab work & pertinent test results  Airway Mallampati: III       Dental  (+) Missing,    Pulmonary sleep apnea and Continuous Positive Airway Pressure Ventilation ,    Pulmonary exam normal        Cardiovascular hypertension, Pt. on medications and Pt. on home beta blockers +CHF  + dysrhythmias Atrial Fibrillation  Rhythm:Regular Rate:Normal     Neuro/Psych negative neurological ROS  negative psych ROS   GI/Hepatic Neg liver ROS, diverticulosis   Endo/Other  diabetes, Type 2Hypothyroidism   Renal/GU negative Renal ROS  negative genitourinary   Musculoskeletal negative musculoskeletal ROS (+)   Abdominal (+)  Abdomen: soft. Bowel sounds: normal.  Peds  Hematology negative hematology ROS (+)   Anesthesia Other Findings   Reproductive/Obstetrics                            Anesthesia Physical Anesthesia Plan  ASA: III  Anesthesia Plan: General   Post-op Pain Management:    Induction: Intravenous  PONV Risk Score and Plan: 2 and Ondansetron, Dexamethasone, Midazolam and Treatment may vary due to age or medical condition  Airway Management Planned: Mask and Oral ETT  Additional Equipment: None  Intra-op Plan:   Post-operative Plan: Extubation in OR  Informed Consent: I have reviewed the patients History and Physical, chart, labs and discussed the procedure including the risks, benefits and alternatives for the proposed anesthesia with the patient or authorized representative who has indicated his/her understanding and acceptance.     Dental advisory given  Plan Discussed with: CRNA  Anesthesia Plan Comments: (Lab Results      Component                Value               Date                      WBC                       7.2                 10/18/2020                HGB                      18.4 (H)            10/18/2020                HCT                      54.0 (H)            10/18/2020                MCV                      88.6                10/18/2020                PLT  165                 10/18/2020           Lab Results      Component                Value               Date                      NA                       142                 10/18/2020                K                        4.3                 10/18/2020                CO2                      28                  10/18/2020                GLUCOSE                  121 (H)             10/18/2020                BUN                      24 (H)              10/18/2020                CREATININE               1.40 (H)            10/18/2020                CALCIUM                  8.8 (L)             10/18/2020                GFRNONAA                 52 (L)              10/18/2020                GFRAA                    51 (L)              07/31/2020          )        Anesthesia Quick Evaluation

## 2020-10-18 NOTE — Anesthesia Postprocedure Evaluation (Signed)
Anesthesia Post Note  Patient: [name]  Procedure(s) Performed: ATRIAL FIBRILLATION ABLATION (N/A )     Patient location during evaluation: PACU Anesthesia Type: General Level of consciousness: awake and alert Pain management: pain level controlled Vital Signs Assessment: post-procedure vital signs reviewed and stable Respiratory status: spontaneous breathing, nonlabored ventilation, respiratory function stable and patient connected to nasal cannula oxygen Cardiovascular status: blood pressure returned to baseline and stable Postop Assessment: no apparent nausea or vomiting Anesthetic complications: no   No complications documented.  Last Vitals:  Vitals:   10/18/20 1330 10/18/20 1331  BP: 127/86   [name]: (!) 58 (!) 59  Resp: 16 10  Temp:    SpO2: 97% 95%    Last Pain:  Vitals:   10/18/20 1346  TempSrc:   PainSc: 0-No pain                 Anyela Napierkowski

## 2020-10-19 ENCOUNTER — Encounter (HOSPITAL_COMMUNITY): Payer: Self-pay | Admitting: Cardiology

## 2020-10-24 ENCOUNTER — Telehealth: Payer: Self-pay

## 2020-10-24 NOTE — Telephone Encounter (Signed)
[name] from [facility] is needing  provider work form. Clinical notes is needing to know how often the patient is using insulin.

## 2020-10-24 NOTE — Telephone Encounter (Signed)
[facility] is sending fax over The paper work again.

## 2020-10-24 NOTE — Telephone Encounter (Signed)
Form completed and faxed for CGM

## 2020-10-25 NOTE — Telephone Encounter (Signed)
Called and spoke with [facility] and clinical notes were routed via Epic to [phone]. Form to be faxed over to be corrected and faxed back.

## 2020-10-25 NOTE — Telephone Encounter (Signed)
Form corrected and faxed to [phone]

## 2020-10-26 ENCOUNTER — Other Ambulatory Visit: Payer: Self-pay

## 2020-10-26 ENCOUNTER — Ambulatory Visit (INDEPENDENT_AMBULATORY_CARE_PROVIDER_SITE_OTHER): Payer: Medicare HMO | Admitting: *Deleted

## 2020-10-26 DIAGNOSIS — I513 Intracardiac thrombosis, not elsewhere classified: Secondary | ICD-10-CM | POA: Diagnosis not present

## 2020-10-26 DIAGNOSIS — Z5181 Encounter for therapeutic drug level monitoring: Secondary | ICD-10-CM

## 2020-10-26 DIAGNOSIS — I4891 Unspecified atrial fibrillation: Secondary | ICD-10-CM | POA: Diagnosis not present

## 2020-10-26 LAB — POCT INR: INR: 2.6 (ref 2.0–3.0)

## 2020-10-26 NOTE — Patient Instructions (Signed)
Description   Continue taking 1.5 tablets of warfarin daily except for 1 tablet on Mondays, Wednesdays and Fridays. Recheck INR in 2 weeks. Coumadin Clinic [phone].

## 2020-10-27 ENCOUNTER — Other Ambulatory Visit: Payer: Self-pay | Admitting: Internal Medicine

## 2020-10-29 DIAGNOSIS — H43393 Other vitreous opacities, bilateral: Secondary | ICD-10-CM | POA: Diagnosis not present

## 2020-10-29 DIAGNOSIS — H524 Presbyopia: Secondary | ICD-10-CM | POA: Diagnosis not present

## 2020-10-29 DIAGNOSIS — E119 Type 2 diabetes mellitus without complications: Secondary | ICD-10-CM | POA: Diagnosis not present

## 2020-10-29 DIAGNOSIS — H25813 Combined forms of age-related cataract, bilateral: Secondary | ICD-10-CM | POA: Diagnosis not present

## 2020-10-31 NOTE — Telephone Encounter (Signed)
Christine for [facility] is needing on question 4, the providers initials  is on the paper work but they are needing the date. Next to the providers name.  [facility] is going to fax the paper work back.

## 2020-11-08 ENCOUNTER — Encounter: Payer: Self-pay | Admitting: Internal Medicine

## 2020-11-08 ENCOUNTER — Ambulatory Visit (INDEPENDENT_AMBULATORY_CARE_PROVIDER_SITE_OTHER): Payer: Medicare HMO

## 2020-11-08 ENCOUNTER — Other Ambulatory Visit: Payer: Self-pay

## 2020-11-08 ENCOUNTER — Ambulatory Visit: Payer: Medicare HMO | Admitting: Internal Medicine

## 2020-11-08 VITALS — BP 118/80 | HR 71 | Ht 73.0 in | Wt 267.8 lb

## 2020-11-08 DIAGNOSIS — I4891 Unspecified atrial fibrillation: Secondary | ICD-10-CM | POA: Diagnosis not present

## 2020-11-08 DIAGNOSIS — E1169 Type 2 diabetes mellitus with other specified complication: Secondary | ICD-10-CM | POA: Diagnosis not present

## 2020-11-08 DIAGNOSIS — E785 Hyperlipidemia, unspecified: Secondary | ICD-10-CM

## 2020-11-08 DIAGNOSIS — E669 Obesity, unspecified: Secondary | ICD-10-CM

## 2020-11-08 DIAGNOSIS — E039 Hypothyroidism, unspecified: Secondary | ICD-10-CM

## 2020-11-08 DIAGNOSIS — I513 Intracardiac thrombosis, not elsewhere classified: Secondary | ICD-10-CM | POA: Diagnosis not present

## 2020-11-08 LAB — POCT GLYCOSYLATED HEMOGLOBIN (HGB A1C): Hemoglobin A1C: 6 % — AB (ref 4.0–5.6)

## 2020-11-08 LAB — POCT INR: INR: 2.9 (ref 2.0–3.0)

## 2020-11-08 NOTE — Patient Instructions (Signed)
Please continue: - Ozempic 0.5 mg weekly in a.m.  - Novolog 8-10 units based on the size of the meal - Tresiba U200 32 units daily  Try to add berries and healthy fats to b'fast (chia seeds, flax seeds, nuts).  Please continue Levothyroxine 50 mcg daily.  Take the thyroid hormone every day, with water, at least 30 minutes before breakfast, separated by at least 4 hours from: - acid reflux medications - calcium - iron - multivitamins  Please return in 6 months.

## 2020-11-08 NOTE — Progress Notes (Signed)
Patient ID: [name], male   DOB: 11-Jul-1955, 65 y.o.   MRN: [id number]   This visit occurred during the SARS-CoV-2 public health emergency.  Safety protocols were in place, including screening questions prior to the visit, additional usage of staff PPE, and extensive cleaning of exam room while observing appropriate contact time as indicated for disinfecting solutions.   HPI: [name] is a 65 y.o.-year-old male, initially referred by his PCP, Dr. Sharlet Salina, returning for follow-up for DM2, dx in 2017, insulin-dependent, uncontrolled, with complications (CHF, Afib/AFlutter, CKD, h/o calf diabetic ulcer).  Last visit 4 mo ago.  Interim history: Since last visit, he continues to have a history of A. fib, and also was found to have a thrombus in the left atrial appendage.  He had cardioversion in 08/2020. Otherwise, he is feeling well and lost 21 more time since last visit. No blurry vision, nausea.  He has nocturia, which has not changed.  Reviewed HbA1c levels Lab Results  Component Value Date   HGBA1C 5.3 08/04/2020   HGBA1C 5.7 (A) 06/29/2020   HGBA1C 5.3 02/24/2020   HGBA1C 14.2 (H) 09/04/2019   HGBA1C 15.0 (H) 09/03/2019   HGBA1C 6.9 (H) 03/08/2019   HGBA1C 6.7 (H) 01/31/2019   HGBA1C 6.1 11/26/2017   HGBA1C 6.2 09/09/2016   HGBA1C 9.5 (H) 08/31/2015   In the past, he was on: - Levemir 40 units 2x a day - Novolog 5 units + SSI 3x a day, before meals: BG 70-120: 0 Units, BG 121-150:3 units, BG 151-200:4 Units, BG 201-250:7 units BG 251-300: 11 Units, 301-350 : 15 units. If BG >350: Call your primary care doctor He was on Glipizide since 2017.  Now on: - Ozempic 0.5 mg weekly in a.m.  - Tresiba U200 66 >> 50 >> 40 >> 32 units daily - Novolog 8-12 units based on the size of the meal (8-12-8 units) >> 8-10 (12)  He checks his sugars more than 4 times a day with his freestyle libre CGM (he pays out-of-pocket for this).   Previously:   Previously:   Lowest sugar  was 71 >> 58 (today - delayed lunch) >> 78; he has hypoglycemia awareness in the 70s. Highest sugar was 186, then 330 >> 230 x1 >> 190. He wasadmitted in 08/2019 with hyperosmolar hyperglycemic nonketotic state, and was found to have a glucose of 1186, along with hyponatremia and AKI.  An HbA1c level was very high.  Glucometer: [facility]   Pt's meals are: - Breakfast: fruit >> biscuits, poptarts >> stopped! -Now skim milk with bran flakes - Lunch: salad - Dinner: salad, chili, hamburger patty, grilled chicken -move dinner earlier - Snacks:- He saw nutrition in 2016.  -+ CKD, last BUN/creatinine:  Lab Results  Component Value Date   BUN 24 (H) 10/18/2020   BUN 21 10/18/2020   CREATININE 1.40 (H) 10/18/2020   CREATININE 1.49 (H) 10/18/2020  On Cozaar 50.  -+ HL; last set of lipids: Lab Results  Component Value Date   CHOL 143 05/16/2020   HDL 53.20 05/16/2020   LDLCALC 69 05/16/2020   LDLDIRECT 81.0 08/31/2015   TRIG 107.0 05/16/2020   CHOLHDL 3 05/16/2020  On Crestor 10.  - last eye exam was in 10/2020: No DR reportedly.  -No numbness and tingling in his feet.  He has a history of right calf ulcer- he required skin graft - 2020  Pt has no FH of DM.  Uncontrolled hypothyroidism:    Reviewed his TFTs: Lab Results  Component Value Date   TSH 3.52 06/29/2020   TSH 4.61 (H) 05/16/2020   TSH 33.362 (H) 09/03/2019   TSH 13.910 (H) 02/02/2019   TSH 25.834 (H) 01/31/2019   TSH 4.800 (H) 05/03/2018   TSH 3.39 11/26/2017   TSH 3.510 03/18/2017   TSH 3.51 08/31/2015   TSH 3.49 10/07/2013   He takes levothyroxine 50 mcg daily: - in am - fasting - now drinks black coffee - at least 30 min from b'fast - no calcium - no iron - + multivitamins later in the day - no PPIs - not on Biotin  He has a history of HTN, OSA.  In the past he was doing 4-5 beers every day.  Previously was drinking hard liquor.  He stopped drinking alcohol last year.  ROS: Constitutional: no weight  gain/+ weight loss, no fatigue, no subjective hyperthermia, no subjective hypothermia Eyes: no blurry vision, no xerophthalmia ENT: no sore throat, + see HPI Cardiovascular: no CP/no SOB/no palpitations/no leg swelling Respiratory: no cough/no SOB/no wheezing Gastrointestinal: no N/no V/no D/no C/no acid reflux Musculoskeletal: no muscle aches/+ joint aches Skin: no rashes, no hair loss Neurological: no tremors/no numbness/no tingling/no dizziness  I reviewed pt's medications, allergies, PMH, social hx, family hx, and changes were documented in the history of present illness. Otherwise, unchanged from my initial visit note.  Past Medical History:  Diagnosis Date  . Asthma    as a teenager - does not use an inhaler although pt said he has an albuteral inhaler  . Atrial fibrillation ([place])    persistant 02/2009  . Atrial flutter ([place])    s/p CTI ablation 08/06/10  . Cataract   . Chronic rhinitis   . Colon polyp   . Congestive heart failure ([place])   . Diverticulosis    colonoscopy 04/03/2009  . Hyperlipidemia   . Hypertension   . Morbid obesity ([place])    target weight = 219  for BMI < 30  . Nonischemic cardiomyopathy ([facility])    tachycardia mediated  . Seasonal allergies   . Sleep apnea    original 2001 - wears C-PaP  . Type 2 diabetes mellitus ([place])    Past Surgical History:  Procedure Laterality Date  . ATRIAL FIBRILLATION ABLATION N/A 12/14/2018   Procedure: ATRIAL FIBRILLATION ABLATION;  Surgeon: Thompson Grayer, MD;  Location: [place] [facility];  Service: Cardiovascular;  Laterality: N/A;  . ATRIAL FIBRILLATION ABLATION N/A 10/18/2020   Procedure: ATRIAL FIBRILLATION ABLATION;  Surgeon: Thompson Grayer, MD;  Location: [place] [facility];  Service: Cardiovascular;  Laterality: N/A;  . atrial flutter ablation  08/06/10  . CARDIOVERSION N/A 07/05/2018   Procedure: CARDIOVERSION;  Surgeon: Buford Dresser, MD;  Location: [facility] ENDOSCOPY;  Service: Cardiovascular;  Laterality: N/A;  .  CARDIOVERSION N/A 08/08/2020   Procedure: CARDIOVERSION;  Surgeon: Larey Dresser, MD;  Location: [facility] ENDOSCOPY;  Service: Cardiovascular;  Laterality: N/A;  . CARDIOVERSION N/A 08/29/2020   Procedure: CARDIOVERSION;  Surgeon: Jolaine Artist, MD;  Location: [facility] ENDOSCOPY;  Service: Cardiovascular;  Laterality: N/A;  . COLONOSCOPY    . POLYPECTOMY    . RIGHT HEART CATH N/A 08/06/2020   Procedure: RIGHT HEART CATH;  Surgeon: Larey Dresser, MD;  Location: [place] [facility];  Service: Cardiovascular;  Laterality: N/A;  . TEE WITHOUT CARDIOVERSION N/A 12/14/2018   Procedure: TRANSESOPHAGEAL ECHOCARDIOGRAM (TEE);  Surgeon: Thompson Grayer, MD;  Location: [place] [facility];  Service: Cardiovascular;  Laterality: N/A;  . TEE WITHOUT CARDIOVERSION N/A  08/03/2020   Procedure: TRANSESOPHAGEAL ECHOCARDIOGRAM (TEE);  Surgeon: Sanda Klein, MD;  Location: [place];  Service: Cardiovascular;  Laterality: N/A;  . TEE WITHOUT CARDIOVERSION N/A 08/08/2020   Procedure: TRANSESOPHAGEAL ECHOCARDIOGRAM (TEE);  Surgeon: Larey Dresser, MD;  Location: [facility] ENDOSCOPY;  Service: Cardiovascular;  Laterality: N/A;  . TEE WITHOUT CARDIOVERSION N/A 10/18/2020   Procedure: TRANSESOPHAGEAL ECHOCARDIOGRAM (TEE);  Surgeon: Jerline Pain, MD;  Location: [facility] ENDOSCOPY;  Service: Cardiovascular;  Laterality: N/A;   Social History   Socioeconomic History  . Marital status: Single    Spouse name: Not on file  . Number of children: 0  . Years of education: Not on file  . Highest education level: Not on file  Occupational History  . Occupation: Sales  Tobacco Use  . Smoking status: Never Smoker  . Smokeless tobacco: Never Used  Vaping Use  . Vaping Use: Never used  Substance and Sexual Activity  . Alcohol use: Yes    Alcohol/week: 21.0 standard drinks    Types: 21 Cans of beer per week    Comment: previously quite heavy, working on cessation   . Drug use: No  . Sexual activity: Not on file  Other Topics Concern   . Not on file  Social History Narrative   Lives in [place] and works for [employer]   Social Determinants of Health   Financial Resource Strain: Not on file  Food Insecurity: Not on file  Transportation Needs: Not on file  Physical Activity: Not on file  Stress: Not on file  Social Connections: Not on file  Intimate Partner Violence: Not on file   Current Outpatient Medications on File Prior to Visit  Medication Sig Dispense Refill  . albuterol (PROVENTIL HFA;VENTOLIN HFA) 108 (90 Base) MCG/ACT inhaler Inhale 2 puffs into the lungs every 6 (six) hours as needed for wheezing or shortness of breath.    [name] amiodarone (PACERONE) 200 MG tablet Take 1 tablet (200 mg total) by mouth daily.    . B-D UF III MINI PEN NEEDLES 31G X 5 MM MISC 1 EACH BY OTHER ROUTE 4 (FOUR) TIMES DAILY. E11.9 100 each 4  . carvedilol (COREG) 3.125 MG tablet TAKE 1 TABLET BY MOUTH TWICE A DAY WITH FOOD (Patient taking differently: Take 3.125 mg by mouth daily.) 180 tablet 4  . Continuous Blood Gluc Receiver (FREESTYLE LIBRE 14 DAY READER) DEVI USE AS DIRECTED 1 each 11  . Continuous Blood Gluc Sensor (FREESTYLE LIBRE 14 DAY SENSOR) MISC Use as directed by Diabetes educator 2 each 11  . digoxin (LANOXIN) 0.125 MG tablet TAKE 0.5 TABLETS (0.0625 MG TOTAL) BY MOUTH DAILY. 45 tablet 2  . doxylamine, Sleep, (UNISOM) 25 MG tablet Take 50 mg by mouth at bedtime.    . empagliflozin (JARDIANCE) 10 MG TABS tablet Take 1 tablet (10 mg total) by mouth daily. 30 tablet 5  . furosemide (LASIX) 40 MG tablet Take 1 tablet (40 mg total) by mouth daily as needed. (Patient taking differently: Take 20 mg by mouth daily.) 30 tablet 5  . hydrALAZINE (APRESOLINE) 50 MG tablet TAKE 1 AND 1/2 TABLETS BY MOUTH EVERY 8 HOURS (Patient taking differently: Take 100 mg by mouth 2 (two) times daily.) 405 tablet 1  . insulin aspart (NOVOLOG) 100 UNIT/ML FlexPen Inject 8-12 Units into the skin 3 (three) times daily with meals. (Patient taking  differently: Inject 8-12 Units into the skin 3 (three) times daily with meals. Sliding Scale) 30 mL 3  . isosorbide mononitrate (IMDUR)  60 MG 24 hr tablet TAKE 1 TABLET BY MOUTH EVERY DAY (Patient taking differently: Take 60 mg by mouth daily.) 90 tablet 4  . levothyroxine (SYNTHROID) 50 MCG tablet TAKE 1 TABLET BY MOUTH EVERY DAY BEFORE BREAKFAST (Patient taking differently: Take 50 mcg by mouth daily before breakfast.) 90 tablet 0  . mometasone (NASONEX) 50 MCG/ACT nasal spray Place 2 sprays into the nose daily as needed (allergies or rhinitis).    . Multiple Vitamins-Minerals (MULTIVITAMIN WITH MINERALS) tablet Take 2 tablets by mouth at bedtime. Chewable    . OZEMPIC, 0.25 OR 0.5 MG/DOSE, 2 MG/1.5ML SOPN INJECT 0.5 MG INTO THE SKIN ONCE A WEEK. 1.5 mL 5  . pantoprazole (PROTONIX) 40 MG tablet Take 1 tablet (40 mg total) by mouth daily. 45 tablet 0  . sacubitril-valsartan (ENTRESTO) 24-26 MG Take 1 tablet by mouth 2 (two) times daily. 60 tablet 3  . spironolactone (ALDACTONE) 25 MG tablet Take 1 tablet (25 mg total) by mouth daily. 30 tablet 5  . SYMBICORT 80-4.5 MCG/ACT inhaler USE 2 INHALATIONS TWICE A DAY (Patient taking differently: Inhale 2 puffs into the lungs daily as needed (Asthma/ breathing problem).) 30.6 g 1  . thiamine 100 MG tablet Take 1 tablet (100 mg total) by mouth daily. 30 tablet 1  . TRESIBA FLEXTOUCH 200 UNIT/ML FlexTouch Pen INJECT 66 UNITS INTO THE SKIN DAILY. (Patient taking differently: Inject 32 Units into the skin daily.) 9 mL 3  . vitamin C (ASCORBIC ACID) 500 MG tablet Take 500 mg by mouth in the morning.    . warfarin (COUMADIN) 5 MG tablet TAKE 1 TABLET TO 1 AND 1/2 TABLETS BY MOUTH DAILY AS DIRECTED BY THE COUMADIN CLINIC. (Patient taking differently: Take 5 mg by mouth See admin instructions. Take 5 mg on Monday, Wednesday and Friday all the the other days take 7.5 mg) 130 tablet 0   No current facility-administered medications on file prior to visit.   No Known  Allergies Family History  Problem Relation Age of Onset  . Hypertension Mother   . Kidney failure Mother        Due to sepsis  . COPD Father   . Colon cancer Brother        dx in 39's  . Esophageal cancer Neg Hx   . Rectal cancer Neg Hx   . Stomach cancer Neg Hx     PE: BP 118/80   Pulse 71   Ht 6\' 1"  (1.854 m)   Wt 267 lb 12.8 oz (121.5 kg)   SpO2 97%   BMI 35.33 kg/m   Wt Readings from Last 3 Encounters:  11/08/20 267 lb 12.8 oz (121.5 kg)  10/18/20 261 lb (118.4 kg)  09/17/20 268 lb (121.6 kg)   Constitutional: overweight, in NAD Eyes: PERRLA, EOMI, no exophthalmos ENT: moist mucous membranes, no thyromegaly, no cervical lymphadenopathy Cardiovascular: RRR, No MRG Respiratory: CTA B Gastrointestinal: abdomen soft, NT, ND, BS+ Musculoskeletal: no deformities, strength intact in all 4 Skin: moist, warm, no rashes Neurological: + tremor with outstretched hands, DTR normal in all 4  ASSESSMENT: 1. DM2, insulin-dependent, uncontrolled, with complications - CHF - A. fib/a flutter - CKD - h/o Diabetic ulcer of calf - h/o HHNK - admitted with a glucose of >1000  -started on basal bolus regimen in the hospital  2.  Hypothyroidism - Uncontrolled  3. Obesity class 2  4. HL  PLAN:  1. Patient with longstanding, uncontrolled, type 2 diabetes, insulin-dependent, on basal-bolus insulin regimen and also  weekly GLP-1 receptor agonist.  His blood sugars improved dramatically after he stopped drinking alcohol last year.  HbA1c decreased from 14.2% to 5.3%.  At last visit, HbA1c was slightly higher, at 5.7%, but still excellent.  Of note, he has a CGM, which he pays out-of-pocket (~$124 month) -At last visit, his sugars appear to be well controlled but they were increasing significantly after breakfast.  Upon questioning when she was eating biscuits and pop tarts.  We discussed about stopping these.  Discussed about healthier alternatives.  She also had a low blood sugar at 58 in  the office at last visit due to delaying lunch.  We decreased his Tresiba dose. CGM interpretation: -At today's visit, we reviewed his CGM downloads: It appears that 93% of values are in target range (goal >70%), increased from last visit; 7% are higher than 180 (goal <25%), and 0% are lower than 70 (goal <4%).  The calculated average blood sugar is 137.  The projected HbA1c for the next 3 months ([facility]) is 6.6% -this is usually more accurate for him than the directly measured HbA1c. -Reviewing the CGM trends, it appears that his sugars are very well controlled throughout the day and his sugars after breakfast improved tremendously after he changed his diet.  He only has occasional blood sugars above 180 now, but we discussed about further improvement in his breakfast to avoid these spikes.  I suggested adding berries to his cereals, changing skim milk to almond or soy milk, and adding healthy fats in the form of Chia seeds, flaxseeds, or nuts.  He already eliminated snacks after dinner and moved to dinner earlier and the result is significant improvement in the blood sugars and also 21 pounds weight loss.  I congratulated him for his diabetes control and the weight loss. -No changes are needed in his regimen for now. - I suggested to:  Patient Instructions  Please continue: - Ozempic 0.5 mg weekly in a.m.  - Novolog 8-10 units based on the size of the meal - Tresiba U200 32 units daily  Try to add berries and healthy fats to b'fast (chia seeds, flax seeds, nuts).  Please continue Levothyroxine 50 mcg daily.  Take the thyroid hormone every day, with water, at least 30 minutes before breakfast, separated by at least 4 hours from: - acid reflux medications - calcium - iron - multivitamins  Please return in 6 months.   - we checked his HbA1c: 6.0% (slightly higher) - advised to check sugars at different times of the day - 3-4x a day, rotating check times - advised for yearly eye exams >> he is  UTD - return to clinic in 6 months  2.  Hypothyroidism - latest thyroid labs reviewed with pt. >> normal: Lab Results  Component Value Date   TSH 3.52 06/29/2020   - he continues on LT4 50 mcg daily - pt feels good on this dose. - we discussed about taking the thyroid hormone every day, with water, >30 minutes before breakfast, separated by >4 hours from acid reflux medications, calcium, iron, multivitamins. Pt. is taking it correctly.  3. Obesity class 2 -We will continue GLP-1 receptor agonist, which should also help with weight loss -At last visit, his weight was 288 pounds and he lost more than 20 pounds since then!!! -now lost 21 lbs since last OV after changing b'fast and not eating after dinner and moving dinners earlier  4. HL -Reviewed latest lipid panel from 05/2020: All fractions at goal Lab  Results  Component Value Date   CHOL 143 05/16/2020   HDL 53.20 05/16/2020   LDLCALC 69 05/16/2020   LDLDIRECT 81.0 08/31/2015   TRIG 107.0 05/16/2020   CHOLHDL 3 05/16/2020  -He continues Crestor 10 mg daily without side effects.  Philemon Kingdom, MD PhD [facility] Endocrinology

## 2020-11-08 NOTE — Patient Instructions (Signed)
Description   Continue taking 1.5 tablets of warfarin daily except for 1 tablet on Mondays, Wednesdays and Fridays. Recheck INR in 4 weeks. [facility]in Clinic [phone].

## 2020-11-13 DIAGNOSIS — E1169 Type 2 diabetes mellitus with other specified complication: Secondary | ICD-10-CM | POA: Diagnosis not present

## 2020-11-15 ENCOUNTER — Ambulatory Visit (HOSPITAL_COMMUNITY): Payer: Medicare HMO | Admitting: Nurse Practitioner

## 2020-11-16 ENCOUNTER — Encounter (HOSPITAL_COMMUNITY): Payer: Medicare HMO | Admitting: Cardiology

## 2020-11-16 ENCOUNTER — Ambulatory Visit (HOSPITAL_COMMUNITY): Payer: Medicare HMO

## 2020-11-21 ENCOUNTER — Ambulatory Visit (HOSPITAL_COMMUNITY): Payer: Medicare HMO | Admitting: Nurse Practitioner

## 2020-11-27 ENCOUNTER — Other Ambulatory Visit: Payer: Self-pay | Admitting: Internal Medicine

## 2020-11-28 ENCOUNTER — Other Ambulatory Visit: Payer: Self-pay

## 2020-11-28 ENCOUNTER — Ambulatory Visit (HOSPITAL_COMMUNITY)
Admission: RE | Admit: 2020-11-28 | Discharge: 2020-11-28 | Disposition: A | Discharge status: Home / Self Care | Payer: Medicare HMO | Source: Ambulatory Visit | Attending: Nurse Practitioner | Admitting: Nurse Practitioner

## 2020-11-28 ENCOUNTER — Encounter (HOSPITAL_COMMUNITY): Payer: Self-pay | Admitting: Nurse Practitioner

## 2020-11-28 VITALS — BP 112/78 | HR 60 | Ht 73.0 in | Wt 259.6 lb

## 2020-11-28 DIAGNOSIS — I454 Nonspecific intraventricular block: Secondary | ICD-10-CM | POA: Diagnosis not present

## 2020-11-28 DIAGNOSIS — I4892 Unspecified atrial flutter: Secondary | ICD-10-CM | POA: Insufficient documentation

## 2020-11-28 DIAGNOSIS — E785 Hyperlipidemia, unspecified: Secondary | ICD-10-CM | POA: Insufficient documentation

## 2020-11-28 DIAGNOSIS — I5022 Chronic systolic (congestive) heart failure: Secondary | ICD-10-CM | POA: Diagnosis not present

## 2020-11-28 DIAGNOSIS — Z7984 Long term (current) use of oral hypoglycemic drugs: Secondary | ICD-10-CM | POA: Diagnosis not present

## 2020-11-28 DIAGNOSIS — Z7901 Long term (current) use of anticoagulants: Secondary | ICD-10-CM | POA: Insufficient documentation

## 2020-11-28 DIAGNOSIS — Z79899 Other long term (current) drug therapy: Secondary | ICD-10-CM | POA: Diagnosis not present

## 2020-11-28 DIAGNOSIS — D6869 Other thrombophilia: Secondary | ICD-10-CM | POA: Diagnosis not present

## 2020-11-28 DIAGNOSIS — I509 Heart failure, unspecified: Secondary | ICD-10-CM | POA: Diagnosis not present

## 2020-11-28 DIAGNOSIS — Z7951 Long term (current) use of inhaled steroids: Secondary | ICD-10-CM | POA: Diagnosis not present

## 2020-11-28 DIAGNOSIS — I110 Hypertensive heart disease with heart failure: Secondary | ICD-10-CM | POA: Diagnosis not present

## 2020-11-28 DIAGNOSIS — I4819 Other persistent atrial fibrillation: Secondary | ICD-10-CM | POA: Diagnosis not present

## 2020-11-28 DIAGNOSIS — I428 Other cardiomyopathies: Secondary | ICD-10-CM | POA: Diagnosis not present

## 2020-11-28 DIAGNOSIS — E119 Type 2 diabetes mellitus without complications: Secondary | ICD-10-CM | POA: Diagnosis not present

## 2020-11-28 DIAGNOSIS — I480 Paroxysmal atrial fibrillation: Secondary | ICD-10-CM | POA: Diagnosis not present

## 2020-11-28 DIAGNOSIS — Z6834 Body mass index (BMI) 34.0-34.9, adult: Secondary | ICD-10-CM | POA: Insufficient documentation

## 2020-11-28 DIAGNOSIS — Z794 Long term (current) use of insulin: Secondary | ICD-10-CM | POA: Insufficient documentation

## 2020-11-28 MED ORDER — HYDRALAZINE HCL 50 MG PO TABS: 100.0000 mg | ORAL_TABLET | Freq: Two times a day (BID) | ORAL | Status: DC

## 2020-11-28 MED ORDER — CARVEDILOL 3.125 MG PO TABS
3.1250 mg | ORAL_TABLET | Freq: Two times a day (BID) | ORAL | 4 refills | Status: DC
Start: 2020-11-28 — End: 2021-04-23

## 2020-11-28 MED ORDER — FUROSEMIDE 20 MG PO TABS: 20.0000 mg | ORAL_TABLET | Freq: Every day | ORAL | Status: DC

## 2020-11-28 NOTE — Progress Notes (Signed)
Primary Care Physician: [name], MD Referring Physician: Dr. Rayann Heman AHF- Dr. Pete Glatter is a 65 y.o. male with a h/o persistent afib that is here for one month f/u ablation. He has been staying in SR to his knowledge, since the procedure. He remains on amiodarone 200 mg daily. No swallowing or groin issues. He is back to his usual activities. He was hospitalized in February for HF thought to be 2/2 tachycardia mediated cardiomyopathy. He has lost around 50 lbs since then and fluid weight is staying stable.    Today, he denies symptoms of palpitations, chest pain, shortness of breath, orthopnea, PND, lower extremity edema, dizziness, presyncope, syncope, or neurologic sequela. The patient is tolerating medications without difficulties and is otherwise without complaint today.   Past Medical History:  Diagnosis Date   Asthma    as a teenager - does not use an inhaler although pt said he has an albuteral inhaler   Atrial fibrillation (HCC)    persistant 02/2009   Atrial flutter ([place])    s/p CTI ablation 08/06/10   Cataract    Chronic rhinitis    Colon polyp    Congestive heart failure ([place])    Diverticulosis    colonoscopy 04/03/2009   Hyperlipidemia    Hypertension    Morbid obesity ([place])    target weight = 219  for BMI < 30   Nonischemic cardiomyopathy (HCC)    tachycardia mediated   Seasonal allergies    Sleep apnea    original 2001 - wears C-PaP   Type 2 diabetes mellitus ([place])    Past Surgical History:  Procedure Laterality Date   ATRIAL FIBRILLATION ABLATION N/A 12/14/2018   Procedure: ATRIAL FIBRILLATION ABLATION;  Surgeon: Thompson Grayer, MD;  Location: [place] [facility];  Service: Cardiovascular;  Laterality: N/A;   ATRIAL FIBRILLATION ABLATION N/A 10/18/2020   Procedure: ATRIAL FIBRILLATION ABLATION;  Surgeon: Thompson Grayer, MD;  Location: [place] [facility];  Service: Cardiovascular;  Laterality: N/A;   atrial flutter ablation  08/06/10    CARDIOVERSION N/A 07/05/2018   Procedure: CARDIOVERSION;  Surgeon: Buford Dresser, MD;  Location[facility] ENDOSCOPY;  Service: Cardiovascular;  Laterality: N/A;   CARDIOVERSION N/A 08/08/2020   Procedure: CARDIOVERSION;  Surgeon: Larey Dresser, MD;  Location: [facility] ENDOSCOPY;  Service: Cardiovascular;  Laterality: N/A;   CARDIOVERSION N/A 08/29/2020   Procedure: CARDIOVERSION;  Surgeon: Jolaine Artist, MD;  Location: [place];  Service: Cardiovascular;  Laterality: N/A;   COLONOSCOPY     POLYPECTOMY     RIGHT HEART CATH N/A 08/06/2020   Procedure: RIGHT HEART CATH;  Surgeon: Larey Dresser, MD;  Location: [place] [facility];  Service: Cardiovascular;  Laterality: N/A;   TEE WITHOUT CARDIOVERSION N/A 12/14/2018   Procedure: TRANSESOPHAGEAL ECHOCARDIOGRAM (TEE);  Surgeon: Thompson Grayer, MD;  Location: [place] [facility];  Service: Cardiovascular;  Laterality: N/A;   TEE WITHOUT CARDIOVERSION N/A 08/03/2020   Procedure: TRANSESOPHAGEAL ECHOCARDIOGRAM (TEE);  Surgeon: Sanda Klein, MD;  Location: [place];  Service: Cardiovascular;  Laterality: N/A;   TEE WITHOUT CARDIOVERSION N/A 08/08/2020   Procedure: TRANSESOPHAGEAL ECHOCARDIOGRAM (TEE);  Surgeon: Larey Dresser, MD;  Location: [facility] ENDOSCOPY;  Service: Cardiovascular;  Laterality: N/A;   TEE WITHOUT CARDIOVERSION N/A 10/18/2020   Procedure: TRANSESOPHAGEAL ECHOCARDIOGRAM (TEE);  Surgeon: Jerline Pain, MD;  Location: [facility] ENDOSCOPY;  Service: Cardiovascular;  Laterality: N/A;    Current Outpatient Medications  Medication Sig Dispense Refill   albuterol (  PROVENTIL HFA;VENTOLIN HFA) 108 (90 Base) MCG/ACT inhaler Inhale 2 puffs into the lungs every 6 (six) hours as needed for wheezing or shortness of breath.     amiodarone (PACERONE) 200 MG tablet Take 1 tablet (200 mg total) by mouth daily.     B-D UF III MINI PEN NEEDLES 31G X 5 MM MISC 1 EACH BY OTHER ROUTE 4 (FOUR) TIMES DAILY. E11.9 100 each 4   BD PEN NEEDLE NANO 2ND GEN 32G X  4 MM MISC USE 4 TIMES A DAY AS DIRECTED     carvedilol (COREG) 3.125 MG tablet TAKE 1 TABLET BY MOUTH TWICE A DAY WITH FOOD (Patient taking differently: Take 3.125 mg by mouth daily.) 180 tablet 4   cholecalciferol (VITAMIN D3) 25 MCG (1000 UNIT) tablet Take 1,000 Units by mouth daily.     Continuous Blood Gluc Receiver (FREESTYLE LIBRE 14 DAY READER) DEVI USE AS DIRECTED 1 each 11   Continuous Blood Gluc Sensor (FREESTYLE LIBRE 14 DAY SENSOR) MISC Use as directed by Diabetes educator 2 each 11   digoxin (LANOXIN) 0.125 MG tablet TAKE 0.5 TABLETS (0.0625 MG TOTAL) BY MOUTH DAILY. 45 tablet 2   doxylamine, Sleep, (UNISOM) 25 MG tablet Take 50 mg by mouth at bedtime.     empagliflozin (JARDIANCE) 10 MG TABS tablet Take 1 tablet (10 mg total) by mouth daily. 30 tablet 5   furosemide (LASIX) 40 MG tablet Take 1 tablet (40 mg total) by mouth daily as needed. (Patient taking differently: Take 20 mg by mouth daily.) 30 tablet 5   hydrALAZINE (APRESOLINE) 50 MG tablet TAKE 1 AND 1/2 TABLETS BY MOUTH EVERY 8 HOURS (Patient taking differently: Take 100 mg by mouth 2 (two) times daily.) 405 tablet 1   insulin aspart (NOVOLOG) 100 UNIT/ML FlexPen Inject 8-12 Units into the skin 3 (three) times daily with meals. (Patient taking differently: Inject 8-12 Units into the skin 3 (three) times daily with meals. Sliding Scale) 30 mL 3   isosorbide mononitrate (IMDUR) 60 MG 24 hr tablet TAKE 1 TABLET BY MOUTH EVERY DAY (Patient taking differently: Take 60 mg by mouth daily.) 90 tablet 4   levothyroxine (SYNTHROID) 50 MCG tablet TAKE 1 TABLET BY MOUTH EVERY DAY BEFORE BREAKFAST (Patient taking differently: Take 50 mcg by mouth daily before breakfast.) 90 tablet 0   losartan (COZAAR) 25 MG tablet Take 12.5 mg by mouth daily.     mometasone (NASONEX) 50 MCG/ACT nasal spray Place 2 sprays into the nose daily as needed (allergies or rhinitis).     Multiple Vitamins-Minerals (MULTIVITAMIN WITH MINERALS) tablet Take 2 tablets by  mouth at bedtime. Chewable     OZEMPIC, 0.25 OR 0.5 MG/DOSE, 2 MG/1.5ML SOPN INJECT 0.5 MG INTO THE SKIN ONCE A WEEK. 1.5 mL 5   pantoprazole (PROTONIX) 40 MG tablet Take 1 tablet (40 mg total) by mouth daily. 45 tablet 0   sacubitril-valsartan (ENTRESTO) 24-26 MG Take 1 tablet by mouth 2 (two) times daily. 60 tablet 3   spironolactone (ALDACTONE) 25 MG tablet Take 1 tablet (25 mg total) by mouth daily. 30 tablet 5   SYMBICORT 80-4.5 MCG/ACT inhaler USE 2 INHALATIONS TWICE A DAY (Patient taking differently: Inhale 2 puffs into the lungs daily as needed (Asthma/ breathing problem).) 30.6 g 1   thiamine 100 MG tablet Take 1 tablet (100 mg total) by mouth daily. 30 tablet 1   TRESIBA FLEXTOUCH 200 UNIT/ML FlexTouch Pen INJECT 66 UNITS INTO THE SKIN DAILY. (Patient taking differently: Inject 32  Units into the skin daily.) 9 mL 3   vitamin C (ASCORBIC ACID) 500 MG tablet Take 500 mg by mouth in the morning.     warfarin (COUMADIN) 5 MG tablet TAKE 1 TABLET TO 1 AND 1/2 TABLETS BY MOUTH DAILY AS DIRECTED BY THE COUMADIN CLINIC. (Patient taking differently: Take 5 mg by mouth See admin instructions. Take 5 mg on Monday, Wednesday and Friday all the the other days take 7.5 mg) 130 tablet 0   No current facility-administered medications for this encounter.    No Known Allergies  Social History   Socioeconomic History   Marital status: Single    Spouse name: Not on file   Number of children: 0   Years of education: Not on file   Highest education level: Not on file  Occupational History   Occupation: Sales  Tobacco Use   Smoking status: Never   Smokeless tobacco: Never  Vaping Use   Vaping Use: Never used  Substance and Sexual Activity   Alcohol use: Yes    Alcohol/week: 21.0 standard drinks    Types: 21 Cans of beer per week    Comment: previously quite heavy, working on cessation    Drug use: No   Sexual activity: Not on file  Other Topics Concern   Not on file  Social History  Narrative   Lives in [place]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   Social Determinants of Health   Financial Resource Strain: Not on file  Food Insecurity: Not on file  Transportation Needs: Not on file  Physical Activity: Not on file  Stress: Not on file  Social Connections: Not on file  Intimate Partner Violence: Not on file    Family History  Problem Relation Age of Onset   Hypertension Mother    Kidney failure Mother        Due to sepsis   COPD Father    Colon cancer Brother        dx in 33's   Esophageal cancer Neg Hx    Rectal cancer Neg Hx    Stomach cancer Neg Hx     ROS- All systems are reviewed and negative except as per the HPI above  Physical Exam: Vitals:   11/28/20 1430  BP: 112/78  Pulse: 60  Weight: 117.8 kg  Height: 6\' 1"  (1.854 m)   Wt Readings from Last 3 Encounters:  11/28/20 117.8 kg  11/08/20 121.5 kg  10/18/20 118.4 kg    Labs: Lab Results  Component Value Date   NA 142 10/18/2020   K 4.3 10/18/2020   CL 104 10/18/2020   CO2 28 10/18/2020   GLUCOSE 121 (H) 10/18/2020   BUN 24 (H) 10/18/2020   CREATININE 1.40 (H) 10/18/2020   CALCIUM 8.8 (L) 10/18/2020   PHOS 3.1 09/04/2019   MG 2.2 08/10/2020   Lab Results  Component Value Date   INR 2.9 11/08/2020   Lab Results  Component Value Date   CHOL 143 05/16/2020   HDL 53.20 05/16/2020   LDLCALC 69 05/16/2020   TRIG 107.0 05/16/2020     GEN- The patient is well appearing, alert and oriented x 3 today.   Head- normocephalic, atraumatic Eyes-  Sclera clear, conjunctiva pink Ears- hearing intact Oropharynx- clear Neck- supple, no JVP Lymph- no cervical lymphadenopathy Lungs- Clear to ausculation bilaterally, normal work of breathing Heart- Regular rate and rhythm, no murmurs, rubs or gallops, PMI not laterally displaced GI- soft, NT, ND, + BS Extremities- no clubbing,  cyanosis, or edema MS- no significant deformity or atrophy Skin- no rash or lesion Psych- euthymic mood, full  affect Neuro- strength and sensation are intact  EKG-Sinus rhythm at 60 bpm, with first degree AV block, pr int 226 bpm, qrs int 148 ms, qtc 478 ms     Assessment and Plan:  1. Persistent afib  S/p ablation  10/18/20 Staying in SR  Continue amiodarone 200 mg daily  Continue digoxin, carvedilol   2. CHA2DS2VASc of at least Continue warfarin  Failed to prevent LAA thrombus on xarelto/pradaxa and now on warafin   3. Acute on chronic HF Weight is stable  No obvious fluid overload  Continue current HF meds   4. HTN Stable   F/u with Dr. Aundra Dubin 7/27 and Dr.Allred 8/10  Geroge Baseman. Markesia Crilly, [facility] [address] [place], [place] [address] [phone]

## 2020-11-28 NOTE — Addendum Note (Signed)
Encounter addended by: Enid Derry, CMA on: 11/28/2020 3:34 PM  Actions taken: Patient-reported medication modified, Order Reconciliation Section accessed, Order list changed, Diagnosis association updated

## 2020-12-01 ENCOUNTER — Other Ambulatory Visit: Payer: Self-pay | Admitting: Cardiology

## 2020-12-04 DIAGNOSIS — G4730 Sleep apnea, unspecified: Secondary | ICD-10-CM | POA: Diagnosis not present

## 2020-12-04 DIAGNOSIS — G4733 Obstructive sleep apnea (adult) (pediatric): Secondary | ICD-10-CM | POA: Diagnosis not present

## 2020-12-06 ENCOUNTER — Other Ambulatory Visit: Payer: Self-pay

## 2020-12-06 ENCOUNTER — Ambulatory Visit (INDEPENDENT_AMBULATORY_CARE_PROVIDER_SITE_OTHER): Payer: Medicare HMO | Admitting: *Deleted

## 2020-12-06 DIAGNOSIS — I513 Intracardiac thrombosis, not elsewhere classified: Secondary | ICD-10-CM | POA: Diagnosis not present

## 2020-12-06 DIAGNOSIS — I4891 Unspecified atrial fibrillation: Secondary | ICD-10-CM

## 2020-12-06 LAB — POCT INR: INR: 2.3 (ref 2.0–3.0)

## 2020-12-06 NOTE — Patient Instructions (Signed)
Description   Tonight take 2 tablets then continue taking 1.5 tablets of warfarin daily except for 1 tablet on Mondays, Wednesdays and Fridays. Recheck INR in 4 weeks. [facility] [phone].

## 2021-01-01 ENCOUNTER — Ambulatory Visit (HOSPITAL_BASED_OUTPATIENT_CLINIC_OR_DEPARTMENT_OTHER): Payer: Medicare HMO | Admitting: Cardiology

## 2021-01-01 ENCOUNTER — Other Ambulatory Visit: Payer: Self-pay

## 2021-01-01 VITALS — BP 99/73 | HR 71 | Ht 73.0 in | Wt 253.6 lb

## 2021-01-01 DIAGNOSIS — I480 Paroxysmal atrial fibrillation: Secondary | ICD-10-CM

## 2021-01-01 DIAGNOSIS — E1169 Type 2 diabetes mellitus with other specified complication: Secondary | ICD-10-CM

## 2021-01-01 DIAGNOSIS — I5022 Chronic systolic (congestive) heart failure: Secondary | ICD-10-CM

## 2021-01-01 DIAGNOSIS — I10 Essential (primary) hypertension: Secondary | ICD-10-CM | POA: Diagnosis not present

## 2021-01-01 DIAGNOSIS — D6869 Other thrombophilia: Secondary | ICD-10-CM

## 2021-01-01 DIAGNOSIS — E785 Hyperlipidemia, unspecified: Secondary | ICD-10-CM

## 2021-01-01 DIAGNOSIS — Z7189 Other specified counseling: Secondary | ICD-10-CM

## 2021-01-01 DIAGNOSIS — I428 Other cardiomyopathies: Secondary | ICD-10-CM

## 2021-01-01 NOTE — Progress Notes (Signed)
Cardiology Office Note:    Date:  01/01/2021   ID:  [name], DOB Mar 18, 1956, MRN [id number]  PCP:  [name], MD  Cardiologist:  Buford Dresser, MD PhD  Referring MD: [name], *   CC: follow up  History of Present Illness:    [name] is a 65 y.o. male with a hx of persistent atrial fibrillation, atrial flutter with prior ablation in 2012, prior cardioversions, HTN, chronic systolic heart failure (thought to be tachycardia induced) with EF 30-35% on tte 04/2018, OSA who is seen in follow up today. I first saw him as a new consult 12/28/18.  Today: Overall he is feeling good. He has plenty of energy, and able to play golf in the heat. He has a blood pressure machine at home, but does not check it regularly. At previous clinic visits it has averaged in the 110s. At home his weight has remained stable. He has had some success with weight loss since his last visit.   In the last month, golfing, working in the yard, and riding his motorcycle are the most strenuous activities. He does not experience any exertional symptoms.  For the last 3 months, his sugar has been at goal.   Currently he believes he is taking losartan twice a day, as well as 25 mg entresto.  He is scheduled for a repeat Echo on 7/29. At this time he is still waiting on a CPAP.  He denies any chest pain, shortness of breath, or palpitations. No headaches, lightheadedness, or syncope to report. Also has no lower extremity edema, orthopnea or PND. He denies hematuria or blood in his stool.   Past Medical History:  Diagnosis Date   Asthma    as a teenager - does not use an inhaler although pt said he has an albuteral inhaler   Atrial fibrillation (HCC)    persistant 02/2009   Atrial flutter ([place])    s/p CTI ablation 08/06/10   Cataract    Chronic rhinitis    Colon polyp    Congestive heart failure ([place])    Diverticulosis    colonoscopy 04/03/2009   Hyperlipidemia     Hypertension    Morbid obesity ([place])    target weight = 219  for BMI < 30   Nonischemic cardiomyopathy (HCC)    tachycardia mediated   Seasonal allergies    Sleep apnea    original 2001 - wears C-PaP   Type 2 diabetes mellitus ([place])     Past Surgical History:  Procedure Laterality Date   ATRIAL FIBRILLATION ABLATION N/A 12/14/2018   Procedure: ATRIAL FIBRILLATION ABLATION;  Surgeon: Thompson Grayer, MD;  Location: [place] [facility];  Service: Cardiovascular;  Laterality: N/A;   ATRIAL FIBRILLATION ABLATION N/A 10/18/2020   Procedure: ATRIAL FIBRILLATION ABLATION;  Surgeon: Thompson Grayer, MD;  Location: [place] [facility];  Service: Cardiovascular;  Laterality: N/A;   atrial flutter ablation  08/06/10   CARDIOVERSION N/A 07/05/2018   Procedure: CARDIOVERSION;  Surgeon: Buford Dresser, MD;  Location: [facility] ENDOSCOPY;  Service: Cardiovascular;  Laterality: N/A;   CARDIOVERSION N/A 08/08/2020   Procedure: CARDIOVERSION;  Surgeon: Larey Dresser, MD;  Location: [facility] ENDOSCOPY;  Service: Cardiovascular;  Laterality: N/A;   CARDIOVERSION N/A 08/29/2020   Procedure: CARDIOVERSION;  Surgeon: Jolaine Artist, MD;  Location: [place];  Service: Cardiovascular;  Laterality: N/A;   COLONOSCOPY     POLYPECTOMY     RIGHT HEART CATH N/A 08/06/2020   Procedure:  RIGHT HEART CATH;  Surgeon: Larey Dresser, MD;  Location: [place] [facility];  Service: Cardiovascular;  Laterality: N/A;   TEE WITHOUT CARDIOVERSION N/A 12/14/2018   Procedure: TRANSESOPHAGEAL ECHOCARDIOGRAM (TEE);  Surgeon: Thompson Grayer, MD;  Location: [place] [facility];  Service: Cardiovascular;  Laterality: N/A;   TEE WITHOUT CARDIOVERSION N/A 08/03/2020   Procedure: TRANSESOPHAGEAL ECHOCARDIOGRAM (TEE);  Surgeon: Sanda Klein, MD;  Location: [place];  Service: Cardiovascular;  Laterality: N/A;   TEE WITHOUT CARDIOVERSION N/A 08/08/2020   Procedure: TRANSESOPHAGEAL ECHOCARDIOGRAM (TEE);  Surgeon: Larey Dresser, MD;   Location: [facility] ENDOSCOPY;  Service: Cardiovascular;  Laterality: N/A;   TEE WITHOUT CARDIOVERSION N/A 10/18/2020   Procedure: TRANSESOPHAGEAL ECHOCARDIOGRAM (TEE);  Surgeon: Jerline Pain, MD;  Location: [facility] ENDOSCOPY;  Service: Cardiovascular;  Laterality: N/A;    Current Medications: Current Outpatient Medications on File Prior to Visit  Medication Sig   albuterol (PROVENTIL HFA;VENTOLIN HFA) 108 (90 Base) MCG/ACT inhaler Inhale 2 puffs into the lungs every 6 (six) hours as needed for wheezing or shortness of breath.   amiodarone (PACERONE) 200 MG tablet Take 1 tablet (200 mg total) by mouth daily.   B-D UF III MINI PEN NEEDLES 31G X 5 MM MISC 1 EACH BY OTHER ROUTE 4 (FOUR) TIMES DAILY. E11.9   BD PEN NEEDLE NANO 2ND GEN 32G X 4 MM MISC USE 4 TIMES A DAY AS DIRECTED   carvedilol (COREG) 3.125 MG tablet Take 1 tablet (3.125 mg total) by mouth 2 (two) times daily with a meal.   cholecalciferol (VITAMIN D3) 25 MCG (1000 UNIT) tablet Take 1,000 Units by mouth daily.   Continuous Blood Gluc Receiver (FREESTYLE LIBRE 14 DAY READER) DEVI USE AS DIRECTED   Continuous Blood Gluc Sensor (FREESTYLE LIBRE 14 DAY SENSOR) MISC Use as directed by Diabetes educator   digoxin (LANOXIN) 0.125 MG tablet TAKE 0.5 TABLETS (0.0625 MG TOTAL) BY MOUTH DAILY.   doxylamine, Sleep, (UNISOM) 25 MG tablet Take 50 mg by mouth at bedtime.   empagliflozin (JARDIANCE) 10 MG TABS tablet Take 1 tablet (10 mg total) by mouth daily.   furosemide (LASIX) 20 MG tablet Take 1 tablet (20 mg total) by mouth daily.   hydrALAZINE (APRESOLINE) 50 MG tablet Take 2 tablets (100 mg total) by mouth 2 (two) times daily.   insulin aspart (NOVOLOG) 100 UNIT/ML FlexPen Inject 8-12 Units into the skin 3 (three) times daily with meals. (Patient taking differently: Inject 8-12 Units into the skin 3 (three) times daily with meals. Sliding Scale)   isosorbide mononitrate (IMDUR) 60 MG 24 hr tablet TAKE 1 TABLET BY MOUTH EVERY DAY (Patient taking  differently: Take 60 mg by mouth daily.)   levothyroxine (SYNTHROID) 50 MCG tablet TAKE 1 TABLET BY MOUTH EVERY DAY BEFORE BREAKFAST (Patient taking differently: Take 50 mcg by mouth daily before breakfast.)   losartan (COZAAR) 25 MG tablet Take 25 mg by mouth daily.   mometasone (NASONEX) 50 MCG/ACT nasal spray Place 2 sprays into the nose daily as needed (allergies or rhinitis).   Multiple Vitamins-Minerals (MULTIVITAMIN WITH MINERALS) tablet Take 2 tablets by mouth at bedtime. Chewable   OZEMPIC, 0.25 OR 0.5 MG/DOSE, 2 MG/1.5ML SOPN INJECT 0.5 MG INTO THE SKIN ONCE A WEEK.   pantoprazole (PROTONIX) 40 MG tablet Take 1 tablet (40 mg total) by mouth daily.   sacubitril-valsartan (ENTRESTO) 24-26 MG Take 1 tablet by mouth 2 (two) times daily.   spironolactone (ALDACTONE) 25 MG tablet TAKE 1 TABLET BY MOUTH EVERY DAY  SYMBICORT 80-4.5 MCG/ACT inhaler USE 2 INHALATIONS TWICE A DAY (Patient taking differently: Inhale 2 puffs into the lungs daily as needed (Asthma/ breathing problem).)   thiamine 100 MG tablet Take 1 tablet (100 mg total) by mouth daily.   TRESIBA FLEXTOUCH 200 UNIT/ML FlexTouch Pen INJECT 66 UNITS INTO THE SKIN DAILY. (Patient taking differently: Inject 32 Units into the skin daily.)   vitamin C (ASCORBIC ACID) 500 MG tablet Take 500 mg by mouth in the morning.   warfarin (COUMADIN) 5 MG tablet TAKE 1 TABLET TO 1 AND 1/2 TABLETS BY MOUTH DAILY AS DIRECTED BY THE COUMADIN CLINIC. (Patient taking differently: Take 5 mg by mouth See admin instructions. Take 5 mg on Monday, Wednesday and Friday all the the other days take 7.5 mg)   No current facility-administered medications on file prior to visit.     Allergies:   Patient has no known allergies.   Social History   Tobacco Use   Smoking status: Never   Smokeless tobacco: Never  Vaping Use   Vaping Use: Never used  Substance Use Topics   Alcohol use: Yes    Alcohol/week: 21.0 standard drinks    Types: 21 Cans of beer per week     Comment: previously quite heavy, working on cessation    Drug use: No     Family History: The patient's family history includes COPD in his father; Colon cancer in his brother; Hypertension in his mother; Kidney failure in his mother. There is no history of Esophageal cancer, Rectal cancer, or Stomach cancer.  ROS:   Please see the history of present illness.   Additional pertinent ROS otherwise unremarkable.  EKGs/Labs/Other Studies Reviewed:    The following studies were reviewed today: See HPI for summary of recent TEE findings  Atrial Fibrillation Ablation 10/18/2020: CONCLUSIONS: 1. Atrial fibrillation upon presentation.   2. Intracardiac echo reveals a large sized left atrium with four separate pulmonary veins without evidence of pulmonary vein stenosis. 3. Successful electrical isolation and anatomical encircling of all four pulmonary veins with radiofrequency current.  A WACA approach was used 3. Additional left atrial ablation was performed with a standard box lesion created along the posterior wall of the left atrium 4. Atrial fibrillation successfully cardioverted to sinus rhythm. 5. No early apparent complications.   Additional instructions: The patient takes coumadin for stroke prevention.  We will resume coumadin this evening.  The patient should not interrupt oral anticoagulation for any reason for 3 months post ablation.  Right Heart Cath 08/06/2020: 1. Filling pressures remain elevated. 2. Cardiac output is low even on milrinone 0.25.  TTE 09/04/19 1. Left ventricular ejection fraction, by estimation, is 40 to 45%. The  left ventricle has mildly decreased function. The left ventricle  demonstrates global hypokinesis. There is severe left ventricular  hypertrophy, asymmetric and septal predominant  (parasternal views of the septum raise question of HOCM or infiltrative  cardiomyopathy - cardiac MRI could be considered if clinically  appropriate). Left  ventricular diastolic parameters are consistent with  Grade I diastolic dysfunction (impaired  relaxation).   2. Right ventricular systolic function is normal. The right ventricular  size is normal. Tricuspid regurgitation signal is inadequate for assessing  PA pressure.   3. Left atrial size was mildly dilated.   4. The mitral valve is grossly normal. Trivial mitral valve  regurgitation.   5. The aortic valve was not well visualized. Aortic valve regurgitation  is not visualized.   6. Aortic dilatation noted. There  is mild dilatation of the ascending  aorta.   7. The inferior vena cava is normal in size with greater than 50%  respiratory variability, suggesting right atrial pressure of 3 mmHg.   Lexiscan 04/07/19 Nuclear stress EF: 49%. The left ventricular ejection fraction is mildly decreased (45-54%). No T wave inversion was noted during stress. There was no ST segment deviation noted during stress. Defect 1: There is a small defect of mild severity. This is an intermediate risk study.   Small size, mild intensity fixed apical perfusion defect, likely thinning. No significant reversible ischemia. Dilated LV with mild hypokinesis - LVEF 49%. This is an intermediate risk study due to decreased LV function.  TTE 05/03/18 Left ventricle: The cavity size was normal. There was severe   concentric hypertrophy. Systolic function was moderately to   severely reduced. The estimated ejection fraction was in the   range of 30% to 35%. Diffuse hypokinesis. The study was not   technically sufficient to allow evaluation of LV diastolic   dysfunction due to atrial fibrillation. - Aortic valve: There was no regurgitation. - Aortic root: The aortic root was normal in size. - Mitral valve: There was trivial regurgitation. - Left atrium: The atrium was severely dilated. - Right ventricle: Systolic function was normal. - Right atrium: The atrium was moderately dilated. - Tricuspid valve: There  was trivial regurgitation. - Pulmonic valve: Transvalvular velocity was within the normal   range. There was no evidence for stenosis. - Pulmonary arteries: Systolic pressure was within the normal   range. - Inferior vena cava: The vessel was normal in size. The   respirophasic diameter changes were in the normal range (= 50%),   consistent with normal central venous pressure. - Pericardium, extracardiac: There was no pericardial effusion.   Impressions:  - When compared to the prior study from 04/02/2017 LVEF has   decreased from 55-60% to 30-35% with diffuse hypokinesis.  TEE 12/14/18  1. The left ventricle has moderate-severely reduced systolic function, with an ejection fraction of 30-35%. The cavity size was moderately dilated. Left ventrical global hypokinesis without regional wall motion abnormalities.  2. The right ventricle has normal systolc function. The cavity was normal. There is no increase in right ventricular wall thickness.  3. Left atrial size was severely dilated.  4. Dense smoke in atria and appendage. Stagnant pre thrombus and likely thrombus in mid to distal LAA High risk for embolic event EP procedure cancelled.  5. Right atrial size was moderately dilated.  6. The mitral valve is grossly normal. Mild thickening of the mitral valve leaflet. Mild calcification of the mitral valve leaflet. Mitral valve regurgitation is mild to moderate by color flow Doppler.  7. Restricted posterior leaflet motion.  8. The aortic valve is tricuspid Moderate thickening of the aortic valve Sclerosis without any evidence of stenosis of the aortic valve. Aortic valve regurgitation is trivial by color flow Doppler.  9. The aortic root is normal in size and structure.  EKG:   01/01/2021: EKG is not ordered today. 4/22-Atrial fibrillation, Rate 67 bpm 2/22- Atrial fibrillation with RVR at 123 bpm, IVCD  Recent Labs: 06/29/2020: TSH 3.52 08/03/2020: B Natriuretic Peptide 1,668.7 08/10/2020:  ALT 59; Magnesium 2.2 10/18/2020: BUN 24; Creatinine, Ser 1.40; Hemoglobin 18.4; Platelets 165; Potassium 4.3; Sodium 142  Recent Lipid Panel    Component Value Date/Time   CHOL 143 05/16/2020 1035   TRIG 107.0 05/16/2020 1035   HDL 53.20 05/16/2020 1035   CHOLHDL 3 05/16/2020 1035  VLDL 21.4 05/16/2020 1035   LDLCALC 69 05/16/2020 1035   LDLDIRECT 81.0 08/31/2015 0905    Physical Exam:    VS:  BP 99/73   Pulse 71   Ht 6\' 1"  (1.854 m)   Wt 253 lb 9.6 oz (115 kg)   SpO2 95%   BMI 33.46 kg/m     Wt Readings from Last 3 Encounters:  11/28/20 259 lb 9.6 oz (117.8 kg)  11/08/20 267 lb 12.8 oz (121.5 kg)  10/18/20 261 lb (118.4 kg)   GEN: Well nourished, well developed. No acute distress today HEENT: Normal, moist mucous membranes NECK: No JVD CARDIAC: regular rhythm, normal S1 and S2, no rubs or gallops. No murmur. VASCULAR: Radial and DP pulses 2+ bilaterally. No carotid bruits RESPIRATORY:  Clear to auscultation without rales, wheezing or rhonchi  ABDOMEN: Soft, non-tender, non-distended MUSCULOSKELETAL:  Ambulates independently SKIN: Warm and dry, no edema NEUROLOGIC:  Alert and oriented x 3. No focal neuro deficits noted. PSYCHIATRIC:  Normal affect   ASSESSMENT:    1. Paroxysmal atrial fibrillation (HCC)   2. Secondary hypercoagulable state ([place])   3. Chronic systolic heart failure ([place])   4. NICM (nonischemic cardiomyopathy) ([place])   5. Essential hypertension   6. Type 2 diabetes mellitus with hyperlipidemia (HCC)   7. Cardiac risk counseling   8. Counseling on health promotion and disease prevention     PLAN:    Paroxysmal atrial fibrillation History of atrial fibrillation and atrial flutter, s/p prior cardioversions and CTI -CHADSVASC=3 -Has OSA, encouraged adherence to CPAP -s/p atrial fibrillation ablation 10/18/20 with Dr. Rayann Heman, had TEE prior -continue coumadin given LAA thrombus with both rivaroxaban and dabigatran. -continue amiodarone, carvedilol,  digoxin, defer management of these to Dr. Rayann Heman given recent ablation -we have discussed pacemaker/ICD indications  Nonischemic cardiomyopathy, with chronic systolic and diastolic heart failure:  -euvolemic, NYHA class I today -does have prior history of hospitalization for heart failure requiring milrinone -Lexiscan done, no reversible ischemia. Have not been able to do coronary angiography due to renal disease -appreciate heart failure clinic assistance -continue carvedilol, digoxin, amiodarone, empagliflozin, hydralazine, isosorbide, entresto, spironolactone. Counseled that he should NOT be taking both losartan and entresto, he will look at his meds at home and make sure he does not take both -on GLP1RA and SGLT2i given diabetes. Followed by Dr. Renne Crigler in endocrinology.  Medication history: -amiodarone and carvedilol were stopped during admission 08/2019 due to symptomatic bradycardia, in the setting of hyperosmolar hyperglycemia. Monitor for bradycardia -came off hydralazine 50 mg BID, imdur 30 mg daily due to hypotension. Monitor for hypotension  Hypertension: -continue medications as above  Type II diabetes with hyperlipidemia: -on semaglutide (GLP1RA), empagliflozin (SGLT2i) and insulin -no aspirin as he is on coumadin -will check LFTs, restart statin if normalized and monitor closely  Cardiac risk counseling and prevention recommendations: -recommend heart healthy/Mediterranean diet, with whole grains, fruits, vegetable, fish, lean meats, nuts, and olive oil. Limit salt. -recommend moderate walking, 3-5 times/week for 30-50 minutes each session. Aim for at least 150 minutes.week. Goal should be pace of 3 miles/hours, or walking 1.5 miles in 30 minutes -recommend avoidance of tobacco products. Avoid excess alcohol. -ASCVD risk score: The 10-year ASCVD risk score Mikey Bussing DC Jr., et al., 2013) is: 18.2%   Values used to calculate the score:     Age: 1 years     Sex: Male     Is  Non-Hispanic African American: No     Diabetic: Yes  Tobacco smoker: No     Systolic Blood Pressure: 428 mmHg     Is BP treated: Yes     HDL Cholesterol: 53.2 mg/dL     Total Cholesterol: 143 mg/dL    Plan for follow up: 3 months to stagger visits  Buford Dresser, MD, PhD, [facility]   Medication Adjustments/Labs and Tests Ordered: Current medicines are reviewed at length with the patient today.  Concerns regarding medicines are outlined above.  No orders of the defined types were placed in this encounter.  No orders of the defined types were placed in this encounter.   Patient Instructions  Medication Instructions:  DO NOT take both losartan and entresto. Continue entresto, but stop losartan if you are taking it (if you are not, do not start).  If you have lightheadedness/dizzyness, check your blood pressure. If it is consistently <110/60, call me. We may need to cut back on the hydralazine.  *If you need a refill on your cardiac medications before your next appointment, please call your pharmacy*   Lab Work: None ordered today   Testing/Procedures: None ordered today    Follow-Up: At [facility], you and your health needs are our priority.  As part of our continuing mission to provide you with exceptional heart care, we have created designated Provider Care Teams.  These Care Teams include your primary Cardiologist (physician) and Advanced Practice Providers (APPs -  Physician Assistants and Nurse Practitioners) who all work together to provide you with the care you need, when you need it.  We recommend signing up for the patient portal called "MyChart".  Sign up information is provided on this After Visit Summary.  MyChart is used to connect with patients for Virtual Visits (Telemedicine).  Patients are able to view lab/test results, encounter notes, upcoming appointments, etc.  Non-urgent messages can be sent to your provider as well.    To learn more about what you can do with MyChart, go to [email or web address].    Your next appointment:   3 month(s)  The format for your next appointment:   In Person  Provider:   Buford Dresser, MD       [facility] Stumpf,acting as a scribe for Buford Dresser, MD.,have documented all relevant documentation on the behalf of Buford Dresser, MD,as directed by  Buford Dresser, MD while in the presence of Buford Dresser, MD.  I, Buford Dresser, MD, have reviewed all documentation for this visit. The documentation on 02/06/21 for the exam, diagnosis, procedures, and orders are all accurate and complete.   Signed, Buford Dresser, MD PhD 01/01/2021  [place]

## 2021-01-01 NOTE — Patient Instructions (Addendum)
Medication Instructions:  DO NOT take both losartan and entresto. Continue entresto, but stop losartan if you are taking it (if you are not, do not start).  If you have lightheadedness/dizzyness, check your blood pressure. If it is consistently <110/60, call me. We may need to cut back on the hydralazine.  *If you need a refill on your cardiac medications before your next appointment, please call your pharmacy*   Lab Work: None ordered today   Testing/Procedures: None ordered today    Follow-Up: At [facility], you and your health needs are our priority.  As part of our continuing mission to provide you with exceptional heart care, we have created designated Provider Care Teams.  These Care Teams include your primary Cardiologist (physician) and Advanced Practice Providers (APPs -  Physician Assistants and Nurse Practitioners) who all work together to provide you with the care you need, when you need it.  We recommend signing up for the patient portal called "MyChart".  Sign up information is provided on this After Visit Summary.  MyChart is used to connect with patients for Virtual Visits (Telemedicine).  Patients are able to view lab/test results, encounter notes, upcoming appointments, etc.  Non-urgent messages can be sent to your provider as well.   To learn more about what you can do with MyChart, go to [email or web address].    Your next appointment:   3 month(s)  The format for your next appointment:   In Person  Provider:   Buford Dresser, MD

## 2021-01-03 ENCOUNTER — Other Ambulatory Visit: Payer: Self-pay

## 2021-01-03 ENCOUNTER — Ambulatory Visit (INDEPENDENT_AMBULATORY_CARE_PROVIDER_SITE_OTHER): Payer: Medicare HMO

## 2021-01-03 DIAGNOSIS — I513 Intracardiac thrombosis, not elsewhere classified: Secondary | ICD-10-CM

## 2021-01-03 DIAGNOSIS — I4891 Unspecified atrial fibrillation: Secondary | ICD-10-CM | POA: Diagnosis not present

## 2021-01-03 LAB — POCT INR: INR: 3.4 — AB (ref 2.0–3.0)

## 2021-01-03 NOTE — Patient Instructions (Signed)
Description   Take 1/2 tablet today, then resume same dosage 1.5 tablets of warfarin daily except for 1 tablet on Mondays, Wednesdays and Fridays. Recheck INR in 3 weeks. Coumadin Clinic [phone].

## 2021-01-09 ENCOUNTER — Other Ambulatory Visit: Payer: Self-pay

## 2021-01-09 ENCOUNTER — Other Ambulatory Visit: Payer: Self-pay | Admitting: Internal Medicine

## 2021-01-09 ENCOUNTER — Ambulatory Visit (HOSPITAL_BASED_OUTPATIENT_CLINIC_OR_DEPARTMENT_OTHER)
Admission: RE | Admit: 2021-01-09 | Discharge: 2021-01-09 | Disposition: A | Discharge status: Home / Self Care | Payer: Medicare HMO | Source: Ambulatory Visit | Attending: Cardiology | Admitting: Cardiology

## 2021-01-09 ENCOUNTER — Encounter (HOSPITAL_COMMUNITY): Payer: Self-pay | Admitting: Cardiology

## 2021-01-09 ENCOUNTER — Ambulatory Visit (HOSPITAL_COMMUNITY)
Admission: RE | Admit: 2021-01-09 | Discharge: 2021-01-09 | Disposition: A | Discharge status: Home / Self Care | Payer: Medicare HMO | Source: Ambulatory Visit | Attending: Adult Health | Admitting: Adult Health

## 2021-01-09 VITALS — BP 112/74 | HR 60 | Wt 254.8 lb

## 2021-01-09 DIAGNOSIS — I4891 Unspecified atrial fibrillation: Secondary | ICD-10-CM | POA: Diagnosis not present

## 2021-01-09 DIAGNOSIS — I081 Rheumatic disorders of both mitral and tricuspid valves: Secondary | ICD-10-CM | POA: Diagnosis not present

## 2021-01-09 DIAGNOSIS — I4892 Unspecified atrial flutter: Secondary | ICD-10-CM | POA: Insufficient documentation

## 2021-01-09 DIAGNOSIS — I5022 Chronic systolic (congestive) heart failure: Secondary | ICD-10-CM | POA: Diagnosis not present

## 2021-01-09 DIAGNOSIS — G4733 Obstructive sleep apnea (adult) (pediatric): Secondary | ICD-10-CM | POA: Diagnosis not present

## 2021-01-09 LAB — COMPREHENSIVE METABOLIC PANEL
ALT: 24 U/L (ref 0–44)
AST: 29 U/L (ref 15–41)
Albumin: 3.8 g/dL (ref 3.5–5.0)
Alkaline Phosphatase: 54 U/L (ref 38–126)
Anion gap: 7 (ref 5–15)
BUN: 15 mg/dL (ref 8–23)
CO2: 27 mmol/L (ref 22–32)
Calcium: 9.4 mg/dL (ref 8.9–10.3)
Chloride: 98 mmol/L (ref 98–111)
Creatinine, Ser: 1.38 mg/dL — ABNORMAL HIGH (ref 0.61–1.24)
GFR, Estimated: 56 mL/min — ABNORMAL LOW (ref >60)
Glucose, Bld: 105 mg/dL — ABNORMAL HIGH (ref 70–99)
Potassium: 4.3 mmol/L (ref 3.5–5.1)
Sodium: 132 mmol/L — ABNORMAL LOW (ref 135–145)
Total Bilirubin: 0.8 mg/dL (ref 0.3–1.2)
Total Protein: 7.2 g/dL (ref 6.5–8.1)

## 2021-01-09 LAB — CBC
HCT: 58.1 % — ABNORMAL HIGH (ref 39.0–52.0)
Hemoglobin: 20.1 g/dL — ABNORMAL HIGH (ref 13.0–17.0)
MCH: 31.2 pg (ref 26.0–34.0)
MCHC: 34.6 g/dL (ref 30.0–36.0)
MCV: 90.1 fL (ref 80.0–100.0)
Platelets: 199 10*3/uL (ref 150–400)
RBC: 6.45 MIL/uL — ABNORMAL HIGH (ref 4.22–5.81)
RDW: 16 % — ABNORMAL HIGH (ref 11.5–15.5)
WBC: 6.1 10*3/uL (ref 4.0–10.5)
nRBC: 0 % (ref 0.0–0.2)

## 2021-01-09 LAB — ECHOCARDIOGRAM COMPLETE
AR max vel: 3.71 cm2
AV Area VTI: 3.99 cm2
AV Area mean vel: 3.32 cm2
AV Mean grad: 2 mmHg
AV Peak grad: 3.5 mmHg
Ao pk vel: 0.94 m/s
Area-P 1/2: 1.25 cm2
MV M vel: 3.85 m/s
MV Peak grad: 59.3 mmHg
MV VTI: 3.3 cm2
Radius: 0.5 cm
S' Lateral: 4 cm

## 2021-01-09 LAB — TSH: TSH: 3.401 u[IU]/mL (ref 0.350–4.500)

## 2021-01-09 LAB — DIGOXIN LEVEL: Digoxin Level: 0.8 ng/mL (ref 0.8–2.0)

## 2021-01-09 MED ORDER — ENTRESTO 49-51 MG PO TABS: 1.0000 | ORAL_TABLET | Freq: Two times a day (BID) | ORAL | 11 refills | Status: DC

## 2021-01-09 MED ORDER — PERFLUTREN LIPID MICROSPHERE
1.0000 mL | INTRAVENOUS | Status: DC | PRN
  Administered 2021-01-09: 6 mL via INTRAVENOUS
  Filled 2021-01-09: qty 10

## 2021-01-09 MED ORDER — AMIODARONE HCL 200 MG PO TABS: 100.0000 mg | ORAL_TABLET | Freq: Every day | ORAL | 3 refills | Status: DC

## 2021-01-09 MED ORDER — HYDRALAZINE HCL 50 MG PO TABS: 50.0000 mg | ORAL_TABLET | Freq: Two times a day (BID) | ORAL | 3 refills | Status: DC

## 2021-01-09 NOTE — Progress Notes (Signed)
  Echocardiogram 2D Echocardiogram has been performed.  [name] 01/09/2021, 10:47 AM

## 2021-01-09 NOTE — Progress Notes (Addendum)
PCP:  Dr Sharlet Salina  Primary Cardiologist: Dr Harrell Gave HF MD: Dr Aundra Dubin  EP: Dr Rayann Heman   HPI: 65 y.o. male w/ h/o cardiomyopathy, presumed to be tachy-mediated from atrial fibrillation/atrial flutter.  Paroxsymal Afib/flutter dates back to ~2012. Had Aflutter ablation in 2012. He had several echos between 2012-2018 showing normal LVEF. Found to have reduced EF on echo in 04/2018, down to 30-35%. Was in afib at the time. 11/2018 was referred for Afib ablation but procedure was aborted after TEE showed LA thrombus, despite being on Xarelto. Anticoagulation changed to Pradaxa. He ultimately spontaneously converted back to NSR. Repeat echo 10/20 showed EF 35-40%.  Plans were made for [facility] 10/20 however had asymptomatic positive COVID test, then renal failure. Ischemic eval changed to [employer] which showed no ischemia in 10/20. Echo repeated 3/21, EF 40-45%, RV normal.    Seen in cardiology clinic, back in atrial fibrillation with RVR.  Presented for outpatient DCCV 08/03/20. TEE showed LA thrombus. DCCV canceled. EF lower on TEE ~5-10% with RV severely reduced. Admitted and started on IV Lasix and PICC line was placed. Milrinone started for low output, initial co-ox 35%. Pradaxa stopped and he was placed on coumadin w/ heparin bridge. Placed on IV amio for afib. He had repeat TEE on 08/07/20 that showed no LAA thrombus so DCCV was done successfully. Transitioned to PO amio 200 mg bid. Remained in NSR. Diuresed well w/ IV Lasix and able to wean of milrinone. Discharge weight 264 pounds.   He went back into atrial fibrillation in 3/22, had another DCCV back to NSR.  He had a TEE in 5/22 showing EF 30-35%, then had atrial fibrillation ablation.    Echo today was done and reviewed, showed EF 40-45% with normal RV and moderate MR.   He returns today for followup of CHF.  He is in NSR.  He feels good, no exertional dyspnea or chest pain.  He has been playing golf.  No palpitations or  lightheadedness.    Labs (5/22): K 4.1, creatinine 1.49  ECG (personally reviewed): NSR, IVCD 150 msec  PMH: 1. Atrial flutter: Ablation 2012.  2. Atrial fibrillation: H/o LAA thrombus on both Xarelto and Pradaxa.  DCCV 2/22, 3/22. Atrial fibrillation ablation in 5/22.  3. Chronic systolic CHF: ?tachycardia-mediated CMP with AF/RVR.  - Cardiolite (10/20): No ischemia.  - TEE (2/22): EF 5-10% with severe RV dysfunction.  - Echo (7/22): EF 40-45%, normal RV, moderate MR.  4. CKD stage 3 5. OSA: Uses CPAP 6. Type 2 diabetes  ROS: All systems negative except as listed in HPI, PMH and Problem List.  Social History   Socioeconomic History   Marital status: Single    Spouse name: Not on file   Number of children: 0   Years of education: Not on file   Highest education level: Not on file  Occupational History   Occupation: Sales  Tobacco Use   Smoking status: Never   Smokeless tobacco: Never  Vaping Use   Vaping Use: Never used  Substance and Sexual Activity   Alcohol use: Yes    Alcohol/week: 21.0 standard drinks    Types: 21 Cans of beer per week    Comment: previously quite heavy, working on cessation    Drug use: No   Sexual activity: Not on file  Other Topics Concern   Not on file  Social History Narrative   Lives in [place] and works for [name]   Social Determinants of Health  Financial Resource Strain: Not on file  Food Insecurity: Not on file  Transportation Needs: Not on file  Physical Activity: Not on file  Stress: Not on file  Social Connections: Not on file  Intimate Partner Violence: Not on file    FH:  Family History  Problem Relation Age of Onset   Hypertension Mother    Kidney failure Mother        Due to sepsis   COPD Father    Colon cancer Brother        dx in 82's   Esophageal cancer Neg Hx    Rectal cancer Neg Hx    Stomach cancer Neg Hx     Past Medical History:  Diagnosis Date   Asthma    as a teenager - does not use an  inhaler although pt said he has an albuteral inhaler   Atrial fibrillation ([place])    persistant 02/2009   Atrial flutter ([place])    s/p CTI ablation 08/06/10   Cataract    Chronic rhinitis    Colon polyp    Congestive heart failure ([place])    Diverticulosis    colonoscopy 04/03/2009   Hyperlipidemia    Hypertension    Morbid obesity ([place])    target weight = 219  for BMI < 30   Nonischemic cardiomyopathy ([facility])    tachycardia mediated   Seasonal allergies    Sleep apnea    original 2001 - wears C-PaP   Type 2 diabetes mellitus (H[facility])     Current Outpatient Medications  Medication Sig Dispense Refill   albuterol (PROVENTIL HFA;VENTOLIN HFA) 108 (90 Base) MCG/ACT inhaler Inhale 2 puffs into the lungs every 6 (six) hours as needed for wheezing or shortness of breath.     B-D UF III MINI PEN NEEDLES 31G X 5 MM MISC 1 EACH BY OTHER ROUTE 4 (FOUR) TIMES DAILY. E11.9 100 each 4   BD PEN NEEDLE NANO 2ND GEN 32G X 4 MM MISC USE 4 TIMES A DAY AS DIRECTED     carvedilol (COREG) 3.125 MG tablet Take 1 tablet (3.125 mg total) by mouth 2 (two) times daily with a meal.  4   cholecalciferol (VITAMIN D3) 25 MCG (1000 UNIT) tablet Take 1,000 Units by mouth daily.     Continuous Blood Gluc Receiver (FREESTYLE LIBRE 14 DAY READER) DEVI USE AS DIRECTED 1 each 11   Continuous Blood Gluc Sensor (FREESTYLE LIBRE 14 DAY SENSOR) MISC Use as directed by Diabetes educator 2 each 11   doxylamine, Sleep, (UNISOM) 25 MG tablet Take 50 mg by mouth at bedtime.     empagliflozin (JARDIANCE) 10 MG TABS tablet Take 1 tablet (10 mg total) by mouth daily. 30 tablet 5   furosemide (LASIX) 20 MG tablet Take 1 tablet (20 mg total) by mouth daily.     insulin aspart (NOVOLOG) 100 UNIT/ML FlexPen Inject 8-12 Units into the skin 3 (three) times daily with meals. 30 mL 3   Insulin Degludec (TRESIBA FLEXTOUCH Soso) Inject 32 Units into the skin daily.     isosorbide mononitrate (IMDUR) 60 MG 24 hr tablet TAKE 1 TABLET BY MOUTH EVERY DAY  90 tablet 4   levothyroxine (SYNTHROID) 50 MCG tablet TAKE 1 TABLET BY MOUTH EVERY DAY BEFORE BREAKFAST 90 tablet 0   mometasone (NASONEX) 50 MCG/ACT nasal spray Place 2 sprays into the nose daily as needed (allergies or rhinitis).     Multiple Vitamins-Minerals (MULTIVITAMIN WITH MINERALS) tablet Take 2 tablets by mouth  at bedtime. Chewable     OZEMPIC, 0.25 OR 0.5 MG/DOSE, 2 MG/1.5ML SOPN INJECT 0.5 MG INTO THE SKIN ONCE A WEEK. 1.5 mL 5   sacubitril-valsartan (ENTRESTO) 49-51 MG Take 1 tablet by mouth 2 (two) times daily. 60 tablet 11   spironolactone (ALDACTONE) 25 MG tablet TAKE 1 TABLET BY MOUTH EVERY DAY 90 tablet 3   SYMBICORT 80-4.5 MCG/ACT inhaler USE 2 INHALATIONS TWICE A DAY 30.6 g 1   thiamine 100 MG tablet Take 1 tablet (100 mg total) by mouth daily. 30 tablet 1   vitamin C (ASCORBIC ACID) 500 MG tablet Take 500 mg by mouth in the morning.     warfarin (COUMADIN) 5 MG tablet TAKE 1 TABLET TO 1 AND 1/2 TABLETS BY MOUTH DAILY AS DIRECTED BY THE COUMADIN CLINIC. (Patient taking differently: Take 5 mg by mouth See admin instructions. Take 5 mg on Monday, Wednesday and Friday all the the other days take 7.5 mg) 130 tablet 0   amiodarone (PACERONE) 200 MG tablet Take 0.5 tablets (100 mg total) by mouth daily. 45 tablet 3   hydrALAZINE (APRESOLINE) 50 MG tablet Take 1 tablet (50 mg total) by mouth 2 (two) times daily. 180 tablet 3   No current facility-administered medications for this encounter.    Vitals:   01/09/21 1059  BP: 112/74  Pulse: 60  SpO2: 95%  Weight: 115.6 kg (254 lb 12.8 oz)   Wt Readings from Last 3 Encounters:  01/09/21 115.6 kg (254 lb 12.8 oz)  01/01/21 115 kg (253 lb 9.6 oz)  11/28/20 117.8 kg (259 lb 9.6 oz)     PHYSICAL EXAM: General: NAD Neck: No JVD, no thyromegaly or thyroid nodule.  Lungs: Clear to auscultation bilaterally with normal respiratory effort. CV: Nondisplaced PMI.  Heart regular S1/S2, no S3/S4, no murmur.  No peripheral edema.  No  carotid bruit.  Normal pedal pulses.  Abdomen: Soft, nontender, no hepatosplenomegaly, no distention.  Skin: Intact without lesions or rashes.  Neurologic: Alert and oriented x 3.  Psych: Normal affect. Extremities: No clubbing or cyanosis.  HEENT: Normal.   ASSESSMENT & PLAN: 1.Chronic systolic CHF: TEE 123456 with EF <10%, severe RV dysfunction, LA appendage thrombus. Echoes in the past have had EF ranging 30-45%.  Possible tachycardia-mediated cardiomyopathy as he seems to become markedly more symptomatic when in atrial fibrillation.  Alternatively, he could have another cause for cardiomyopathy and atrial fibrillation just causes him to decompensate.  Echo in 7/22 in NSR showed EF 40-45% with normal RV, moderate MR. NYHA class I-II, he is not volume overloaded on exam.   - Continue  Coreg 3.125 mg bid.  - Increase Entresto to 49/51 bid and decrease hydralazine to 50 mg bid with Imdur 60 daily. Eventually, increase to top dose of Entresto and stop hydralazine/Imdur.   - With rise in EF, think he can stop digoxin.  - Continue  jardiance 10 mg daily.  - Continue spironolactone 25 mg daily.   - EF has started to rise, will repeat echo in 3 months.  If it has not reached normal range, will arrange for LHC/RHC.  - EF is out of range for CRT-D (has IVCD 150 msec).     2. Atrial fibrillation: Paroxysmal. With LAA clots despite Xarelto and Pradaxa use, he was started warfarin.  Will aim for INR 2.5-3.  AF ablation in 5/22.  He is in NSR today.  - Decrease amiodarone to 100 mg daily, check LFTs and TSH.  He will need  regular eye exam. Stop amiodarone eventually if he stays in NSR.  3. CKD stage 3: BMET today.  4. OSA: Continue CPAP nightly.  5. Type 2 diabetes: Continue Jardiance 10 mg daily.   Followup with HF pharmacist in 3 wks for med titration, see me in 3 months with echo.   Loralie Champagne 01/09/2021  Labs showed transferrin saturation of 50%, hgb 20.  I sent off testing for hereditary  hemochromatosis.  Patient was positive for 2 mutations associated with hereditary hemochromatosis.   - I am concerned that this could be involving his heart, will order MRI.  - I will send for hematology evaluation.   Loralie Champagne 03/08/2021 2:22 PM

## 2021-01-09 NOTE — Patient Instructions (Signed)
EKG done today.  Labs done today. We will contact you only if your labs are abnormal.  DECREASE Amiodarone to '100mg'$  (1/2 tablet) by mouth daily.  STOP taking Digoxin  DECREASE Hydralazine to '50mg'$  (1 tablet) by mouth 2 times daily.   STOP taking Lasix  INCREASE Entresto to 49-'51mg'$  (1 tablet) by mouth 2 times daily.   No other medication changes were made. Please continue all current medications as prescribed.  Your physician recommends that you schedule a follow-up appointment in: 10 days for a lab only appointment, 3 weeks for an appointment with our Clinic Pharmacist and in 3 months for an echo and appointment with Dr. Aundra Dubin.  Your physician has requested that you have an echocardiogram. Echocardiography is a painless test that uses sound waves to create images of your heart. It provides your doctor with information about the size and shape of your heart and how well your heart's chambers and valves are working. This procedure takes approximately one hour. There are no restrictions for this procedure.  If you have any questions or concerns before your next appointment please send [place] a message through [place] or call our office at [phone].    TO LEAVE A MESSAGE FOR THE NURSE SELECT OPTION 2, PLEASE LEAVE A MESSAGE INCLUDING: YOUR NAME DATE OF BIRTH CALL BACK NUMBER REASON FOR CALL**this is important as we prioritize the call backs  YOU WILL RECEIVE A CALL BACK THE SAME DAY AS LONG AS YOU CALL BEFORE 4:00 PM   Do the following things EVERYDAY: Weigh yourself in the morning before breakfast. Write it down and keep it in a log. Take your medicines as prescribed Eat low salt foods--Limit salt (sodium) to 2000 mg per day.  Stay as active as you can everyday Limit all fluids for the day to less than 2 liters   At the [facility], you and your health needs are our priority. As part of our continuing mission to provide you with exceptional heart care, we have  created designated Provider Care Teams. These Care Teams include your primary Cardiologist (physician) and Advanced Practice Providers (APPs- Physician Assistants and Nurse Practitioners) who all work together to provide you with the care you need, when you need it.   You may see any of the following providers on your designated Care Team at your next follow up: Dr Glori Bickers Dr Haynes Kerns, NP Lyda Jester, [place] Audry Riles, PharmD   Please be sure to bring in all your medications bottles to every appointment.

## 2021-01-10 ENCOUNTER — Other Ambulatory Visit: Payer: Self-pay | Admitting: Internal Medicine

## 2021-01-15 ENCOUNTER — Telehealth (HOSPITAL_COMMUNITY): Payer: Self-pay | Admitting: Pharmacy Technician

## 2021-01-15 ENCOUNTER — Other Ambulatory Visit (HOSPITAL_COMMUNITY): Payer: Self-pay

## 2021-01-15 DIAGNOSIS — I5022 Chronic systolic (congestive) heart failure: Secondary | ICD-10-CM

## 2021-01-15 MED ORDER — ENTRESTO 49-51 MG PO TABS: 1.0000 | ORAL_TABLET | Freq: Two times a day (BID) | ORAL | 3 refills | Status: DC

## 2021-01-15 NOTE — Telephone Encounter (Signed)
Advanced Heart Failure Patient Advocate Encounter  Patient called in wondering how he would get the new strength of Entresto. He went from 24-'26mg'$  bid to 49-'51mg'$  bid. Patient said Dr. Aundra Dubin advised him to double up on the 24-'2mg'$  while waiting for the new strength to arrive. He stated that he does not have enough to do that, as he will be out this week.   Philicia (CMA) sent new RX to [employer], 30 day supply at the time of change. Sent her a message requesting a 90 day RX be sent to [employer]. Advised the patient to call [employer] tomorrow and request an expedited refill. Advised him to call back with any other issues.  [name] Boxer, CPhT

## 2021-01-21 ENCOUNTER — Other Ambulatory Visit (HOSPITAL_COMMUNITY): Payer: Medicare HMO

## 2021-01-21 NOTE — Progress Notes (Signed)
PCP:  Dr Sharlet Salina Primary Cardiologist: Dr Harrell Gave HF MD: Dr Aundra Dubin EP: Dr Rayann Heman  HPI:  65 y.o. male w/ h/o cardiomyopathy, presumed to be tachy-mediated from atrial fibrillation/atrial flutter.  Paroxsymal Afib/flutter dates back to ~2012. Had Aflutter ablation in 2012. He had several echos between 2012-2018 showing normal LVEF. Found to have reduced EF on echo in 04/2018, down to 30-35%. Was in afib at the time. 11/2018 was referred for Afib ablation but procedure was aborted after TEE showed LA thrombus, despite being on Xarelto. Anticoagulation changed to Pradaxa. He ultimately spontaneously converted back to NSR. Repeat echo 03/2019 showed EF 35-40%.  Plans were made for [facility] 03/2019 however had asymptomatic positive COVID test, then renal failure. Ischemic eval changed to [employer] which showed no ischemia in 03/2019. Echo repeated 08/2019, EF 40-45%, RV normal.    Seen in cardiology clinic, back in atrial fibrillation with RVR.  Presented for outpatient DCCV 08/03/20. TEE showed LA thrombus. DCCV canceled. EF lower on TEE ~5-10% with RV severely reduced. Admitted and started on IV furosemide and PICC line was placed. Milrinone started for low output, initial co-ox 35%. Pradaxa stopped and he was placed on warfarin with heparin bridge. Placed on IV amiodarone for afib. He had repeat TEE on 08/07/20 that showed no LAA thrombus so DCCV was done successfully. Transitioned to PO amiodarone 200 mg BID. Remained in NSR. Diuresed well with IV furosemide and able to wean off milrinone. Discharge weight 264 pounds.    He went back into atrial fibrillation in 08/2020, had another DCCV back to NSR.  He had a TEE in 10/2020 showing EF 30-35%, then had atrial fibrillation ablation.     Echo 01/09/21 showed EF 40-45% with normal RV and moderate MR.   He recently returned to HF Clinic for followup of CHF on 01/09/21.  He was in NSR.  He felt good, no exertional dyspnea or chest pain.  He had been  playing golf.  No palpitations or lightheadedness.    Today he returns to HF clinic for pharmacist medication titration. At last visit with MD Delene Loll was increased to 49/51 mg BID. Hydralazine was decreased to 50 mg BID with plans to eventually discontinue hydralazine and isosorbide mononitrate as Entresto is increased. Digoxin was discontinued. Amiodarone was decreased to 100 mg daily.  Overall he is feeling well today. Main complaint is back pain/spasms. Has been golfing. No dizziness, lightheadedness, chest pain or palpitations. No SOB/DOE. Weight at home as been stable. Has been taking furosemide 10 mg daily. No LEE, PND or orthopnea. BP in clinic was 130/76. SBPs at other office visits have been 100-112. He believes BP is higher today due to back pain. Took all his medications this morning.    HF Medications: Carvedilol 3.125 mg BID Entresto 49/51 mg BID Spironolactone 25 mg daily Jardiance 10 mg daily Hydralazine 50 mg BID Isosorbide mononitrate 60 mg daily Furosemide 10 mg daily  Has the patient been experiencing any side effects to the medications prescribed?  no  Does the patient have any problems obtaining medications due to transportation or finances?   [employer] insurance. Has [employer] PAP for [employer] and [employer] PAP for [employer].   Understanding of regimen: good Understanding of indications: good Potential of compliance: good Patient understands to avoid NSAIDs. Patient understands to avoid decongestants.    Pertinent Lab Values: 01/22/21: Serum creatinine 1.40, BUN 15, Potassium 4.5, Sodium 137  Vital Signs: Weight: 256 lbs (last clinic weight: 254.8 lbs) Blood pressure: 130/76  Heart rate: 63   Assessment/Plan: 1.Chronic systolic CHF: TEE [id number] with EF <10%, severe RV dysfunction, LA appendage thrombus. Echoes in the past have had EF ranging 30-45%.  Possible tachycardia-mediated cardiomyopathy as he seems to become markedly more symptomatic when in atrial  fibrillation.  Alternatively, he could have another cause for cardiomyopathy and atrial fibrillation just causes him to decompensate.  Echo in 12/2020 in NSR showed EF 40-45% with normal RV, moderate MR.  - NYHA class I-II, he is not volume overloaded on exam.   - Continue furosemide 10 mg daily - Continue carvedilol 3.125 mg BID. - Increase Entresto to 97/103 mg BID. Repeat BMET in  3 weeks.  -Discontinue hydralazine and isosorbide mononitrate.  - Continue spironolactone 25 mg daily.  - Continue Jardiance 10 mg daily. - Digoxin discontinued 01/09/21 due to improvement in EF.    - EF has started to rise, will repeat echo in 3 months (04/23/21).  If it has not reached normal range, will arrange for LHC/RHC. - EF is out of range for CRT-D (has IVCD 150 msec).     2. Atrial fibrillation: Paroxysmal. With LAA clots despite Xarelto and Pradaxa use, he was started warfarin.  Will aim for INR 2.5-3.0.  AF ablation in 10/2020.   - Continue amiodarone to 100 mg daily. Stop amiodarone eventually if he stays in NSR. 3. CKD stage 3: -Scr 1.40 on last check 01/22/21 4. OSA: Continue CPAP nightly. 5. Type 2 diabetes: Continue Jardiance 10 mg daily.  Follow up in 3 weeks for BMET and 3 months with Dr. Domingo Dimes.     Audry Riles, PharmD, BCPS, BCCP, CPP Heart Failure Clinic Pharmacist [phone]

## 2021-01-22 ENCOUNTER — Ambulatory Visit (HOSPITAL_COMMUNITY)
Admission: RE | Admit: 2021-01-22 | Discharge: 2021-01-22 | Disposition: A | Discharge status: Home / Self Care | Payer: Medicare HMO | Source: Ambulatory Visit | Attending: Internal Medicine | Admitting: Internal Medicine

## 2021-01-22 ENCOUNTER — Other Ambulatory Visit: Payer: Self-pay

## 2021-01-22 DIAGNOSIS — I5022 Chronic systolic (congestive) heart failure: Secondary | ICD-10-CM | POA: Diagnosis not present

## 2021-01-22 LAB — BASIC METABOLIC PANEL
Anion gap: 6 (ref 5–15)
BUN: 15 mg/dL (ref 8–23)
CO2: 31 mmol/L (ref 22–32)
Calcium: 9.2 mg/dL (ref 8.9–10.3)
Chloride: 100 mmol/L (ref 98–111)
Creatinine, Ser: 1.4 mg/dL — ABNORMAL HIGH (ref 0.61–1.24)
GFR, Estimated: 55 mL/min — ABNORMAL LOW (ref >60)
Glucose, Bld: 112 mg/dL — ABNORMAL HIGH (ref 70–99)
Potassium: 4.5 mmol/L (ref 3.5–5.1)
Sodium: 137 mmol/L (ref 135–145)

## 2021-01-22 LAB — FERRITIN: Ferritin: 89 ng/mL (ref 24–336)

## 2021-01-22 LAB — IRON AND TIBC
Iron: 138 ug/dL (ref 45–182)
Saturation Ratios: 50 % — ABNORMAL HIGH (ref 17.9–39.5)
TIBC: 274 ug/dL (ref 250–450)
UIBC: 136 ug/dL

## 2021-01-23 ENCOUNTER — Ambulatory Visit: Payer: Medicare HMO | Admitting: Internal Medicine

## 2021-01-23 ENCOUNTER — Encounter: Payer: Self-pay | Admitting: Internal Medicine

## 2021-01-23 ENCOUNTER — Other Ambulatory Visit: Payer: Self-pay

## 2021-01-23 VITALS — BP 100/68 | HR 65 | Ht 73.0 in | Wt 256.6 lb

## 2021-01-23 DIAGNOSIS — I5022 Chronic systolic (congestive) heart failure: Secondary | ICD-10-CM

## 2021-01-23 DIAGNOSIS — F1010 Alcohol abuse, uncomplicated: Secondary | ICD-10-CM

## 2021-01-23 DIAGNOSIS — I428 Other cardiomyopathies: Secondary | ICD-10-CM

## 2021-01-23 DIAGNOSIS — G4733 Obstructive sleep apnea (adult) (pediatric): Secondary | ICD-10-CM

## 2021-01-23 DIAGNOSIS — I4819 Other persistent atrial fibrillation: Secondary | ICD-10-CM | POA: Diagnosis not present

## 2021-01-23 NOTE — Progress Notes (Signed)
PCP: [name], MD Primary Cardiologist: Dr Lowry Bowl is a 64 y.o. male who presents today for routine electrophysiology followup.  Since his recent afib ablation, the patient reports doing very well.  he denies procedure related complications and is pleased with the results of the procedure.  Today, he denies symptoms of palpitations, chest pain, shortness of breath,  lower extremity edema, dizziness, presyncope, or syncope.  The patient is otherwise without complaint today.   Past Medical History:  Diagnosis Date   Asthma    as a teenager - does not use an inhaler although pt said he has an albuteral inhaler   Atrial fibrillation (HCC)    persistant 02/2009   Atrial flutter ([place])    s/p CTI ablation 08/06/10   Cataract    Chronic rhinitis    Colon polyp    Congestive heart failure ([place])    Diverticulosis    colonoscopy 04/03/2009   Hyperlipidemia    Hypertension    Morbid obesity ([place])    target weight = 219  for BMI < 30   Nonischemic cardiomyopathy (HCC)    tachycardia mediated   Seasonal allergies    Sleep apnea    original 2001 - wears C-PaP   Type 2 diabetes mellitus ([place])    Past Surgical History:  Procedure Laterality Date   ATRIAL FIBRILLATION ABLATION N/A 12/14/2018   Procedure: ATRIAL FIBRILLATION ABLATION;  Surgeon: Thompson Grayer, MD;  Location: [place] [facility];  Service: Cardiovascular;  Laterality: N/A;   ATRIAL FIBRILLATION ABLATION N/A 10/18/2020   Procedure: ATRIAL FIBRILLATION ABLATION;  Surgeon: Thompson Grayer, MD;  Location: [place] [facility];  Service: Cardiovascular;  Laterality: N/A;   atrial flutter ablation  08/06/10   CARDIOVERSION N/A 07/05/2018   Procedure: CARDIOVERSION;  Surgeon: Buford Dresser, MD;  Location: [facility] ENDOSCOPY;  Service: Cardiovascular;  Laterality: N/A;   CARDIOVERSION N/A 08/08/2020   Procedure: CARDIOVERSION;  Surgeon: Larey Dresser, MD;  Location: [facility] ENDOSCOPY;  Service: Cardiovascular;   Laterality: N/A;   CARDIOVERSION N/A 08/29/2020   Procedure: CARDIOVERSION;  Surgeon: Jolaine Artist, MD;  Location: [place];  Service: Cardiovascular;  Laterality: N/A;   COLONOSCOPY     POLYPECTOMY     RIGHT HEART CATH N/A 08/06/2020   Procedure: RIGHT HEART CATH;  Surgeon: Larey Dresser, MD;  Location: [place] [facility];  Service: Cardiovascular;  Laterality: N/A;   TEE WITHOUT CARDIOVERSION N/A 12/14/2018   Procedure: TRANSESOPHAGEAL ECHOCARDIOGRAM (TEE);  Surgeon: Thompson Grayer, MD;  Location: [place] [facility];  Service: Cardiovascular;  Laterality: N/A;   TEE WITHOUT CARDIOVERSION N/A 08/03/2020   Procedure: TRANSESOPHAGEAL ECHOCARDIOGRAM (TEE);  Surgeon: Sanda Klein, MD;  Location: [place];  Service: Cardiovascular;  Laterality: N/A;   TEE WITHOUT CARDIOVERSION N/A 08/08/2020   Procedure: TRANSESOPHAGEAL ECHOCARDIOGRAM (TEE);  Surgeon: Larey Dresser, MD;  Location: [facility] ENDOSCOPY;  Service: Cardiovascular;  Laterality: N/A;   TEE WITHOUT CARDIOVERSION N/A 10/18/2020   Procedure: TRANSESOPHAGEAL ECHOCARDIOGRAM (TEE);  Surgeon: Jerline Pain, MD;  Location: [facility] ENDOSCOPY;  Service: Cardiovascular;  Laterality: N/A;    ROS- all systems are personally reviewed and negatives except as per HPI above  Current Outpatient Medications  Medication Sig Dispense Refill   albuterol (PROVENTIL HFA;VENTOLIN HFA) 108 (90 Base) MCG/ACT inhaler Inhale 2 puffs into the lungs every 6 (six) hours as needed for wheezing or shortness of breath.     amiodarone (PACERONE) 200 MG tablet Take 0.5 tablets (100 mg total)  by mouth daily. 45 tablet 3   B-D UF III MINI PEN NEEDLES 31G X 5 MM MISC 1 EACH BY OTHER ROUTE 4 (FOUR) TIMES DAILY. E11.9 100 each 4   BD PEN NEEDLE NANO 2ND GEN 32G X 4 MM MISC USE 4 TIMES A DAY AS DIRECTED     carvedilol (COREG) 3.125 MG tablet Take 1 tablet (3.125 mg total) by mouth 2 (two) times daily with a meal.  4   cholecalciferol (VITAMIN D3) 25 MCG (1000 UNIT)  tablet Take 1,000 Units by mouth daily.     Continuous Blood Gluc Receiver (FREESTYLE LIBRE 14 DAY READER) DEVI USE AS DIRECTED 1 each 11   Continuous Blood Gluc Sensor (FREESTYLE LIBRE 14 DAY SENSOR) MISC Use as directed by Diabetes educator 2 each 11   doxylamine, Sleep, (UNISOM) 25 MG tablet Take 50 mg by mouth at bedtime.     empagliflozin (JARDIANCE) 10 MG TABS tablet Take 1 tablet (10 mg total) by mouth daily. 30 tablet 5   furosemide (LASIX) 20 MG tablet Take 10 mg by mouth.     hydrALAZINE (APRESOLINE) 50 MG tablet Take 1 tablet (50 mg total) by mouth 2 (two) times daily. 180 tablet 3   insulin aspart (NOVOLOG) 100 UNIT/ML FlexPen Inject 8-12 Units into the skin 3 (three) times daily with meals. 30 mL 3   Insulin Degludec (TRESIBA FLEXTOUCH [place]) Inject 32 Units into the skin daily.     isosorbide mononitrate (IMDUR) 60 MG 24 hr tablet TAKE 1 TABLET BY MOUTH EVERY DAY 90 tablet 4   levothyroxine (SYNTHROID) 50 MCG tablet TAKE 1 TABLET BY MOUTH EVERY DAY BEFORE BREAKFAST 90 tablet 0   mometasone (NASONEX) 50 MCG/ACT nasal spray Place 2 sprays into the nose daily as needed (allergies or rhinitis).     Multiple Vitamins-Minerals (MULTIVITAMIN WITH MINERALS) tablet Take 2 tablets by mouth at bedtime. Chewable     OZEMPIC, 0.25 OR 0.5 MG/DOSE, 2 MG/1.5ML SOPN INJECT 0.5 MG INTO THE SKIN ONCE A WEEK. 1.5 mL 5   sacubitril-valsartan (ENTRESTO) 49-51 MG Take 1 tablet by mouth 2 (two) times daily. 180 tablet 3   spironolactone (ALDACTONE) 25 MG tablet TAKE 1 TABLET BY MOUTH EVERY DAY 90 tablet 3   SYMBICORT 80-4.5 MCG/ACT inhaler USE 2 INHALATIONS TWICE A DAY 30.6 g 1   thiamine 100 MG tablet Take 1 tablet (100 mg total) by mouth daily. 30 tablet 1   vitamin C (ASCORBIC ACID) 500 MG tablet Take 500 mg by mouth in the morning.     warfarin (COUMADIN) 5 MG tablet TAKE 1 TABLET TO 1 AND 1/2 TABLETS BY MOUTH DAILY AS DIRECTED BY THE COUMADIN CLINIC. (Patient taking differently: Take 5 mg by mouth See  admin instructions. Take 5 mg on Monday, Wednesday and Friday all the the other days take 7.5 mg) 130 tablet 0   No current facility-administered medications for this visit.    Physical Exam: Vitals:   01/23/21 1347  BP: 100/68  Pulse: 65  SpO2: 96%  Weight: 256 lb 9.6 oz (116.4 kg)  Height: '6\' 1"'$  (1.854 m)    GEN- The patient is well appearing, alert and oriented x 3 today.   Head- normocephalic, atraumatic Eyes-  Sclera clear, conjunctiva pink Ears- hearing intact Oropharynx- clear Lungs- Clear to ausculation bilaterally, normal work of breathing Heart- Regular rate and rhythm, no murmurs, rubs or gallops, PMI not laterally displaced GI- soft, NT, ND, + BS Extremities- no clubbing, cyanosis, or edema  Echo 01/09/21- EF 40-45%  EKG tracing ordered today is personally reviewed and shows sinus rhythm, IVCD  Assessment and Plan:  1. Persistent atrial fibrillation Doing well s/p ablation chads2vasc score is at least 3.  Continue [place] Consider stopping amiodarone on return  2. Nonischemic CM EF 40-45% Continue medical therapy  3. OSA Compliance with CPA Padvised  4. Obesity Body mass index is 33.85 kg/m. He continues to work diligently on this   Return to see me in 3 months  Thompson Grayer MD, [facility] 01/23/2021 2:07 PM

## 2021-01-23 NOTE — Patient Instructions (Addendum)
Medication Instructions:  Your physician recommends that you continue on your current medications as directed. Please refer to the Current Medication list given to you today.  Labwork: None ordered.  Testing/Procedures: None ordered.  Follow-Up: Your physician wants you to follow-up in: 05/03/21 at 2 pm with Thompson Grayer, MD    Any Other Special Instructions Will Be Listed Below (If Applicable).  If you need a refill on your cardiac medications before your next appointment, please call your pharmacy.

## 2021-01-24 ENCOUNTER — Ambulatory Visit (INDEPENDENT_AMBULATORY_CARE_PROVIDER_SITE_OTHER): Payer: Medicare HMO | Admitting: *Deleted

## 2021-01-24 DIAGNOSIS — I4891 Unspecified atrial fibrillation: Secondary | ICD-10-CM | POA: Diagnosis not present

## 2021-01-24 DIAGNOSIS — I513 Intracardiac thrombosis, not elsewhere classified: Secondary | ICD-10-CM | POA: Diagnosis not present

## 2021-01-24 LAB — PROTIME-INR
INR: 6.1 (ref 0.9–1.2)
Prothrombin Time: 57.4 s — ABNORMAL HIGH (ref 9.1–12.0)

## 2021-01-24 LAB — POCT INR: INR: 6.9 — AB (ref 2.0–3.0)

## 2021-01-24 NOTE — Patient Instructions (Addendum)
Description   Called and spoke to pt instructed him to hold his warfarin 8/11, 8/12 and 8/13 then continue to take warfarin 1.5 tablets daily except for 1 tablet on Monday, Wednesday and Friday. Recheck INR in 2 weeks- pt request. Coumadin Clinic [phone].

## 2021-01-27 ENCOUNTER — Other Ambulatory Visit: Payer: Self-pay | Admitting: Internal Medicine

## 2021-02-05 ENCOUNTER — Other Ambulatory Visit: Payer: Self-pay

## 2021-02-05 ENCOUNTER — Ambulatory Visit (HOSPITAL_COMMUNITY)
Admission: RE | Admit: 2021-02-05 | Discharge: 2021-02-05 | Disposition: A | Discharge status: Home / Self Care | Payer: Medicare HMO | Source: Ambulatory Visit | Attending: Internal Medicine | Admitting: Internal Medicine

## 2021-02-05 VITALS — BP 130/76 | HR 63 | Wt 256.0 lb

## 2021-02-05 DIAGNOSIS — Z7984 Long term (current) use of oral hypoglycemic drugs: Secondary | ICD-10-CM | POA: Insufficient documentation

## 2021-02-05 DIAGNOSIS — G4733 Obstructive sleep apnea (adult) (pediatric): Secondary | ICD-10-CM | POA: Insufficient documentation

## 2021-02-05 DIAGNOSIS — I480 Paroxysmal atrial fibrillation: Secondary | ICD-10-CM | POA: Insufficient documentation

## 2021-02-05 DIAGNOSIS — Z8616 Personal history of COVID-19: Secondary | ICD-10-CM | POA: Diagnosis not present

## 2021-02-05 DIAGNOSIS — N183 Chronic kidney disease, stage 3 unspecified: Secondary | ICD-10-CM | POA: Diagnosis not present

## 2021-02-05 DIAGNOSIS — I5022 Chronic systolic (congestive) heart failure: Secondary | ICD-10-CM | POA: Diagnosis not present

## 2021-02-05 DIAGNOSIS — E1122 Type 2 diabetes mellitus with diabetic chronic kidney disease: Secondary | ICD-10-CM | POA: Diagnosis not present

## 2021-02-05 DIAGNOSIS — Z79899 Other long term (current) drug therapy: Secondary | ICD-10-CM | POA: Diagnosis not present

## 2021-02-05 MED ORDER — ENTRESTO 97-103 MG PO TABS: 1.0000 | ORAL_TABLET | Freq: Two times a day (BID) | ORAL | 3 refills | Status: DC

## 2021-02-05 NOTE — Patient Instructions (Addendum)
It was a pleasure seeing you today!  MEDICATIONS: -We are changing your medications today -Stop Hydralazine and isosorbide mononitrate -Increase Entresto to 97/103 mg (1 tablet) twice daily. You may take 2 tablets of the 49/51 mg strength twice daily until you receive the new strength.  - [employer] for new shipment of Entresto: [id number] -Call if you have questions about your medications.   NEXT APPOINTMENT: Return to clinic in 2 months with Dr. Aundra Dubin.  In general, to take care of your heart failure: -Limit your fluid intake to 2 Liters (half-gallon) per day.   -Limit your salt intake to ideally 2-3 grams (2000-3000 mg) per day. -Weigh yourself daily and record, and bring that "weight diary" to your next appointment.  (Weight gain of 2-3 pounds in 1 day typically means fluid weight.) -The medications for your heart are to help your heart and help you live longer.   -Please contact us before stopping any of your heart medications.  Call the clinic at [phone] with questions or to reschedule future appointments.

## 2021-02-06 ENCOUNTER — Encounter (HOSPITAL_BASED_OUTPATIENT_CLINIC_OR_DEPARTMENT_OTHER): Payer: Self-pay | Admitting: Cardiology

## 2021-02-07 ENCOUNTER — Other Ambulatory Visit: Payer: Self-pay

## 2021-02-07 ENCOUNTER — Ambulatory Visit (INDEPENDENT_AMBULATORY_CARE_PROVIDER_SITE_OTHER): Payer: Medicare HMO

## 2021-02-07 DIAGNOSIS — Z5181 Encounter for therapeutic drug level monitoring: Secondary | ICD-10-CM

## 2021-02-07 DIAGNOSIS — I513 Intracardiac thrombosis, not elsewhere classified: Secondary | ICD-10-CM | POA: Diagnosis not present

## 2021-02-07 DIAGNOSIS — I4891 Unspecified atrial fibrillation: Secondary | ICD-10-CM | POA: Diagnosis not present

## 2021-02-07 LAB — POCT INR: INR: 3.4 — AB (ref 2.0–3.0)

## 2021-02-07 NOTE — Patient Instructions (Signed)
Description   Take 0.5 tablet tonight and eat some greens and then continue to take warfarin 1.5 tablets daily except for 1 tablet on Monday, Wednesday and Friday. Recheck INR in 2 weeks Coumadin [facility] [phone].

## 2021-02-11 DIAGNOSIS — E1169 Type 2 diabetes mellitus with other specified complication: Secondary | ICD-10-CM | POA: Diagnosis not present

## 2021-02-26 ENCOUNTER — Other Ambulatory Visit: Payer: Self-pay

## 2021-02-26 ENCOUNTER — Ambulatory Visit (INDEPENDENT_AMBULATORY_CARE_PROVIDER_SITE_OTHER): Payer: Medicare HMO | Admitting: *Deleted

## 2021-02-26 ENCOUNTER — Ambulatory Visit (HOSPITAL_COMMUNITY)
Admission: RE | Admit: 2021-02-26 | Discharge: 2021-02-26 | Disposition: A | Discharge status: Home / Self Care | Payer: Medicare HMO | Source: Ambulatory Visit | Attending: Internal Medicine | Admitting: Internal Medicine

## 2021-02-26 DIAGNOSIS — I4891 Unspecified atrial fibrillation: Secondary | ICD-10-CM

## 2021-02-26 DIAGNOSIS — I513 Intracardiac thrombosis, not elsewhere classified: Secondary | ICD-10-CM | POA: Diagnosis not present

## 2021-02-26 DIAGNOSIS — I5022 Chronic systolic (congestive) heart failure: Secondary | ICD-10-CM | POA: Diagnosis not present

## 2021-02-26 LAB — BASIC METABOLIC PANEL
Anion gap: 9 (ref 5–15)
BUN: 18 mg/dL (ref 8–23)
CO2: 29 mmol/L (ref 22–32)
Calcium: 9.1 mg/dL (ref 8.9–10.3)
Chloride: 97 mmol/L — ABNORMAL LOW (ref 98–111)
Creatinine, Ser: 1.45 mg/dL — ABNORMAL HIGH (ref 0.61–1.24)
GFR, Estimated: 53 mL/min — ABNORMAL LOW (ref >60)
Glucose, Bld: 107 mg/dL — ABNORMAL HIGH (ref 70–99)
Potassium: 4.7 mmol/L (ref 3.5–5.1)
Sodium: 135 mmol/L (ref 135–145)

## 2021-02-26 LAB — POCT INR: INR: 3.5 — AB (ref 2.0–3.0)

## 2021-02-26 NOTE — Addendum Note (Signed)
Encounter addended by: Scarlette Calico, RN on: 02/26/2021 2:14 PM  Actions taken: Visit diagnoses modified, Diagnosis association updated, Order list changed

## 2021-02-26 NOTE — Patient Instructions (Addendum)
Description   Take 1/2 a tablet of warfarin today and then start taking warfarin 1 tablet daily except for 1.5 tablets on Sunday, Tuesday and Thursdays. Recheck INR in 2 weeks. Coumadin Clinic [phone].

## 2021-02-27 DIAGNOSIS — G4733 Obstructive sleep apnea (adult) (pediatric): Secondary | ICD-10-CM | POA: Diagnosis not present

## 2021-02-28 ENCOUNTER — Other Ambulatory Visit: Payer: Self-pay | Admitting: Cardiology

## 2021-03-08 ENCOUNTER — Other Ambulatory Visit: Payer: Self-pay | Admitting: Cardiology

## 2021-03-08 ENCOUNTER — Telehealth (HOSPITAL_COMMUNITY): Payer: Self-pay | Admitting: *Deleted

## 2021-03-08 ENCOUNTER — Other Ambulatory Visit (HOSPITAL_COMMUNITY): Payer: Self-pay | Admitting: Cardiology

## 2021-03-08 DIAGNOSIS — I428 Other cardiomyopathies: Secondary | ICD-10-CM

## 2021-03-08 LAB — HEMOCHROMATOSIS DNA-PCR(C282Y,H63D)

## 2021-03-08 NOTE — Addendum Note (Signed)
Encounter addended by: Larey Dresser, MD on: 03/08/2021 2:23 PM  Actions taken: Clinical Note Signed

## 2021-03-08 NOTE — Telephone Encounter (Signed)
Received results from pt's Hereditary Hemochromatosis Lab test done on 9/15 at [facility]. Results with 2 abn genes detected our of 3. Dr Aundra Dubin called and discussed these resultls with pt, he would also like for pt to get a referral to hematology to f/u and a cardiac MRI to eval for hemochromatosis in the heart. Orders placed, will arrange.

## 2021-03-11 ENCOUNTER — Telehealth: Payer: Self-pay | Admitting: Hematology

## 2021-03-11 NOTE — Telephone Encounter (Signed)
Scheduled appt per 9/23 referral. Pt is aware of appt date and time.  

## 2021-03-12 ENCOUNTER — Ambulatory Visit (INDEPENDENT_AMBULATORY_CARE_PROVIDER_SITE_OTHER): Payer: Medicare HMO

## 2021-03-12 ENCOUNTER — Other Ambulatory Visit: Payer: Self-pay

## 2021-03-12 DIAGNOSIS — I4891 Unspecified atrial fibrillation: Secondary | ICD-10-CM

## 2021-03-12 DIAGNOSIS — I513 Intracardiac thrombosis, not elsewhere classified: Secondary | ICD-10-CM | POA: Diagnosis not present

## 2021-03-12 LAB — POCT INR: INR: 3 (ref 2.0–3.0)

## 2021-03-12 NOTE — Patient Instructions (Signed)
Description   Continue on same dosage of warfarin 1 tablet daily except for 1.5 tablets on Sundays, Tuesdays and Thursdays. Recheck INR in 3 weeks. Coumadin Clinic [phone].

## 2021-03-13 ENCOUNTER — Other Ambulatory Visit: Payer: Self-pay | Admitting: Internal Medicine

## 2021-03-14 ENCOUNTER — Other Ambulatory Visit: Payer: Self-pay

## 2021-03-14 ENCOUNTER — Inpatient Hospital Stay: Payer: Medicare HMO | Attending: Hematology | Admitting: Hematology

## 2021-03-14 ENCOUNTER — Inpatient Hospital Stay: Payer: Medicare HMO

## 2021-03-14 DIAGNOSIS — Z79899 Other long term (current) drug therapy: Secondary | ICD-10-CM | POA: Diagnosis not present

## 2021-03-14 DIAGNOSIS — Z836 Family history of other diseases of the respiratory system: Secondary | ICD-10-CM

## 2021-03-14 DIAGNOSIS — Z8249 Family history of ischemic heart disease and other diseases of the circulatory system: Secondary | ICD-10-CM

## 2021-03-14 DIAGNOSIS — F1020 Alcohol dependence, uncomplicated: Secondary | ICD-10-CM | POA: Diagnosis not present

## 2021-03-14 DIAGNOSIS — D751 Secondary polycythemia: Secondary | ICD-10-CM | POA: Diagnosis not present

## 2021-03-14 DIAGNOSIS — Z841 Family history of disorders of kidney and ureter: Secondary | ICD-10-CM

## 2021-03-14 DIAGNOSIS — Z800 Family history of malignant neoplasm of digestive organs: Secondary | ICD-10-CM | POA: Diagnosis not present

## 2021-03-14 LAB — CBC WITH DIFFERENTIAL (CANCER CENTER ONLY)
Abs Immature Granulocytes: 0.03 10*3/uL (ref 0.00–0.07)
Basophils Absolute: 0.1 10*3/uL (ref 0.0–0.1)
Basophils Relative: 1 %
Eosinophils Absolute: 0.3 10*3/uL (ref 0.0–0.5)
Eosinophils Relative: 4 %
HCT: 60.1 % — ABNORMAL HIGH (ref 39.0–52.0)
Hemoglobin: 20.3 g/dL — ABNORMAL HIGH (ref 13.0–17.0)
Immature Granulocytes: 0 %
Lymphocytes Relative: 17 %
Lymphs Abs: 1.3 10*3/uL (ref 0.7–4.0)
MCH: 30.8 pg (ref 26.0–34.0)
MCHC: 33.8 g/dL (ref 30.0–36.0)
MCV: 91.2 fL (ref 80.0–100.0)
Monocytes Absolute: 0.6 10*3/uL (ref 0.1–1.0)
Monocytes Relative: 8 %
Neutro Abs: 5.1 10*3/uL (ref 1.7–7.7)
Neutrophils Relative %: 70 %
Platelet Count: 181 10*3/uL (ref 150–400)
RBC: 6.59 MIL/uL — ABNORMAL HIGH (ref 4.22–5.81)
RDW: 14 % (ref 11.5–15.5)
WBC Count: 7.4 10*3/uL (ref 4.0–10.5)
nRBC: 0 % (ref 0.0–0.2)

## 2021-03-14 LAB — CMP (CANCER CENTER ONLY)
ALT: 18 U/L (ref 0–44)
AST: 23 U/L (ref 15–41)
Albumin: 3.9 g/dL (ref 3.5–5.0)
Alkaline Phosphatase: 82 U/L (ref 38–126)
Anion gap: 11 (ref 5–15)
BUN: 19 mg/dL (ref 8–23)
CO2: 28 mmol/L (ref 22–32)
Calcium: 10.3 mg/dL (ref 8.9–10.3)
Chloride: 99 mmol/L (ref 98–111)
Creatinine: 1.46 mg/dL — ABNORMAL HIGH (ref 0.61–1.24)
GFR, Estimated: 53 mL/min — ABNORMAL LOW (ref >60)
Glucose, Bld: 119 mg/dL — ABNORMAL HIGH (ref 70–99)
Potassium: 4.9 mmol/L (ref 3.5–5.1)
Sodium: 138 mmol/L (ref 135–145)
Total Bilirubin: 1 mg/dL (ref 0.3–1.2)
Total Protein: 7.8 g/dL (ref 6.5–8.1)

## 2021-03-14 LAB — IRON AND TIBC
Iron: 149 ug/dL (ref 42–163)
Saturation Ratios: 61 % — ABNORMAL HIGH (ref 20–55)
TIBC: 243 ug/dL (ref 202–409)
UIBC: 94 ug/dL — ABNORMAL LOW (ref 117–376)

## 2021-03-14 LAB — FERRITIN: Ferritin: 116 ng/mL (ref 24–336)

## 2021-03-14 NOTE — Patient Instructions (Signed)
Thank you for choosing [facility] to provide your care.   Should you have questions after your visit to the [facility] ([facility]), please contact this office at [phone] between 8:30 AM and 4:30 PM.  Voice mails left after 4:00 PM may not be returned until the following business day.  Calls received after 4:30 PM will be answered by an off-site Nurse Triage Line.    Prescription Refills:  Please have your pharmacy contact us directly for most prescription requests.  Contact the office directly for refills of narcotics (pain medications). Allow 48-72 hours for refills.  Appointments: Please contact the [facility] scheduling department [phone] for questions regarding [facility] appointment scheduling.  Contact the schedulers with any scheduling changes so that your appointment can be rescheduled in a timely manner.   Central Scheduling for [place] [phone] - Call to schedule procedures such as PET scans, CT scans, MRI, Ultrasound, etc.  To afford each patient quality time with our providers, please arrive 30 minutes before your scheduled appointment time.  If you arrive late for your appointment, you may be asked to reschedule.  We strive to give you quality time with our providers, and arriving late affects you and other patients whose appointments are after yours. If you are a no show for multiple scheduled visits, you may be dismissed from the clinic at the providers discretion.     Resources: [facility] Social Workers [phone] for additional information on assistance programs or assistance connecting with community support programs   [place] DSS  [phone]: Information regarding food stamps, Medicaid, and utility assistance [place] [employer] [place] [phone]   [place] [employer]'s shared-ride transportation service for eligible riders who have a disability that prevents them from riding the fixed route bus.   Medicare Rights Center 800-333-4114  Helps people with Medicare understand their rights and benefits, navigate the Medicare system, and secure the quality healthcare they deserve [employer] 800-227-2345 Assists patients locate various types of support and financial assistance Cancer Care: 1-800-813-HOPE (4673) Provides financial assistance, online support groups, medication/co-pay assistance.   Transportation Assistance for appointments at [facility]: Transportation Coordinator [phone]  Again, thank you for choosing  Cancer Center for your care.       

## 2021-03-14 NOTE — Progress Notes (Signed)
[name]   HEMATOLOGY/ONCOLOGY CONSULTATION NOTE  Date of Service: 03/14/2021  Patient Care Team: [name], MD as PCP - General (Internal Medicine) Buford Dresser, MD as PCP - Cardiology (Cardiology) Thompson Grayer, MD as PCP - Electrophysiology (Cardiology)  CHIEF COMPLAINTS/PURPOSE OF CONSULTATION:  Hereditary hemochromatosis  HISTORY OF PRESENTING ILLNESS:   [name] is a wonderful 65 y.o. male who has been referred to [place] by Dr .Sharlet Salina, Real Cons, MD  for evaluation and management of hereditary hemochromatosis.  Patient has a history of hypertension, atrial fibrillation on anticoagulation, dyslipidemia, morbid obesity, nonischemic cardiomyopathy, sleep apnea, type 2 diabetes who recently had labs which showed a compound heterozygous state for hereditary hemochromatosis with findings of  c.845G>A (p.Cys282Tyr) - Detected, heterozygous  c.187C>G (p.His63Asp) - Detected, heterozygous  c.193A>T (p.Ser65Cys) - Not Detected   Patient is unclear why this testing was done but he on lab review was noted to have ferritin levels of 1100 in August 2020.  His ferritin levels have subsequently progressively come down without any phlebotomies or bleeding or blood donation.  His last ferritin level on 01/22/2021 was down to 89 with an iron saturation of 50%.  He was noted to have abnormal liver functions and heavy alcohol use corresponding with his much higher ferritin levels in the past about 2 years ago.  He notes he has cut back on his alcohol use significantly.  Patient notes no known family history of hereditary hemochromatosis, early liver cirrhosis or sudden cardiac death. He notes he has been compliant with his CPAP use for his sleep apnea. Does not note any specific joint problems or skin color changes.  Recent labs on 01/09/2021 also showed significant polycythemia with a hemoglobin level of 20.1 and a hematocrit of 58.1 with normal WBC count of 6.1k and normal  platelets of 199k His polycythemia appears to be progressive since his last normal hematocrit of 49 on 08/05/2020.  Patient notes his increasing hematocrit seems to be corresponding with his start of Jardiance by his cardiologist. We discussed the etiologies of polycythemia and testing to see if there is any concern for polycythemia vera.  Patient notes no fevers no chills no night sweats.  No history of VTE.  MEDICAL HISTORY:  Past Medical History:  Diagnosis Date   Asthma    as a teenager - does not use an inhaler although pt said he has an albuteral inhaler   Atrial fibrillation ([place])    persistant 02/2009   Atrial flutter ([facility])    s/p CTI ablation 08/06/10   Cataract    Chronic rhinitis    Colon polyp    Congestive heart failure ([place])    Diverticulosis    colonoscopy 04/03/2009   Hyperlipidemia    Hypertension    Morbid obesity ([place])    target weight = 219  for BMI < 30   Nonischemic cardiomyopathy ([place])    tachycardia mediated   Seasonal allergies    Sleep apnea    original 2001 - wears C-PaP   Type 2 diabetes mellitus ([place])     SURGICAL HISTORY: Past Surgical History:  Procedure Laterality Date   ATRIAL FIBRILLATION ABLATION N/A 12/14/2018   Procedure: ATRIAL FIBRILLATION ABLATION;  Surgeon: Thompson Grayer, MD;  Location: [place] [facility];  Service: Cardiovascular;  Laterality: N/A;   ATRIAL FIBRILLATION ABLATION N/A 10/18/2020   Procedure: ATRIAL FIBRILLATION ABLATION;  Surgeon: Thompson Grayer, MD;  Location: [facility];  Service: Cardiovascular;  Laterality: N/A;   atrial flutter ablation  08/06/10   CARDIOVERSION N/A 07/05/2018   Procedure: CARDIOVERSION;  Surgeon: Buford Dresser, MD;  Location: [facility] ENDOSCOPY;  Service: Cardiovascular;  Laterality: N/A;   CARDIOVERSION N/A 08/08/2020   Procedure: CARDIOVERSION;  Surgeon: Larey Dresser, MD;  Location: [facility] ENDOSCOPY;  Service: Cardiovascular;  Laterality: N/A;   CARDIOVERSION N/A 08/29/2020   Procedure:  CARDIOVERSION;  Surgeon: Jolaine Artist, MD;  Location: [facility] ENDOSCOPY;  Service: Cardiovascular;  Laterality: N/A;   COLONOSCOPY     POLYPECTOMY     RIGHT HEART CATH N/A 08/06/2020   Procedure: RIGHT HEART CATH;  Surgeon: Larey Dresser, MD;  Location: [place] [facility];  Service: Cardiovascular;  Laterality: N/A;   TEE WITHOUT CARDIOVERSION N/A 12/14/2018   Procedure: TRANSESOPHAGEAL ECHOCARDIOGRAM (TEE);  Surgeon: Thompson Grayer, MD;  Location: [place] [facility];  Service: Cardiovascular;  Laterality: N/A;   TEE WITHOUT CARDIOVERSION N/A 08/03/2020   Procedure: TRANSESOPHAGEAL ECHOCARDIOGRAM (TEE);  Surgeon: Sanda Klein, MD;  Location: [place];  Service: Cardiovascular;  Laterality: N/A;   TEE WITHOUT CARDIOVERSION N/A 08/08/2020   Procedure: TRANSESOPHAGEAL ECHOCARDIOGRAM (TEE);  Surgeon: Larey Dresser, MD;  Location: [facility] ENDOSCOPY;  Service: Cardiovascular;  Laterality: N/A;   TEE WITHOUT CARDIOVERSION N/A 10/18/2020   Procedure: TRANSESOPHAGEAL ECHOCARDIOGRAM (TEE);  Surgeon: Jerline Pain, MD;  Location: [facility] ENDOSCOPY;  Service: Cardiovascular;  Laterality: N/A;    SOCIAL HISTORY: Social History   Socioeconomic History   Marital status: Single    Spouse name: Not on file   Number of children: 0   Years of education: Not on file   Highest education level: Not on file  Occupational History   Occupation: Sales  Tobacco Use   Smoking status: Never   Smokeless tobacco: Never  Vaping Use   Vaping Use: Never used  Substance and Sexual Activity   Alcohol use: Yes    Alcohol/week: 21.0 standard drinks    Types: 21 Cans of beer per week    Comment: previously quite heavy, working on cessation    Drug use: No   Sexual activity: Not on file  Other Topics Concern   Not on file  Social History Narrative   Lives in [place]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   Social Determinants of Health   Financial Resource Strain: Not on file  Food Insecurity: Not on file   Transportation Needs: Not on file  Physical Activity: Not on file  Stress: Not on file  Social Connections: Not on file  Intimate Partner Violence: Not on file    FAMILY HISTORY: Family History  Problem Relation Age of Onset   Hypertension Mother    Kidney failure Mother        Due to sepsis   COPD Father    Colon cancer Brother        dx in 49's   Esophageal cancer Neg Hx    Rectal cancer Neg Hx    Stomach cancer Neg Hx     ALLERGIES:  has No Known Allergies.  MEDICATIONS:  Current Outpatient Medications  Medication Sig Dispense Refill   albuterol (PROVENTIL HFA;VENTOLIN HFA) 108 (90 Base) MCG/ACT inhaler Inhale 2 puffs into the lungs every 6 (six) hours as needed for wheezing or shortness of breath.     amiodarone (PACERONE) 200 MG tablet Take 0.5 tablets (100 mg total) by mouth daily. 45 tablet 3   carvedilol (COREG) 3.125 MG tablet Take 1 tablet (3.125 mg total) by mouth 2 (two) times daily with a meal.  4  cholecalciferol (VITAMIN D3) 25 MCG (1000 UNIT) tablet Take 1,000 Units by mouth daily.     doxylamine, Sleep, (UNISOM) 25 MG tablet Take 50 mg by mouth at bedtime.     empagliflozin (JARDIANCE) 10 MG TABS tablet Take 1 tablet (10 mg total) by mouth daily. 30 tablet 5   furosemide (LASIX) 20 MG tablet Take 10 mg by mouth daily.     insulin aspart (NOVOLOG) 100 UNIT/ML FlexPen Inject 8-12 Units into the skin 3 (three) times daily with meals. 30 mL 3   Insulin Degludec (TRESIBA FLEXTOUCH [place]) Inject 32 Units into the skin daily.     levothyroxine (SYNTHROID) 50 MCG tablet TAKE 1 TABLET BY MOUTH EVERY DAY BEFORE BREAKFAST 90 tablet 0   mometasone (NASONEX) 50 MCG/ACT nasal spray Place 2 sprays into the nose daily as needed (allergies or rhinitis).     Multiple Vitamins-Minerals (MULTIVITAMIN WITH MINERALS) tablet Take 2 tablets by mouth at bedtime. Chewable     OZEMPIC, 0.25 OR 0.5 MG/DOSE, 2 MG/1.5ML SOPN INJECT 0.5 MG INTO THE SKIN ONCE A WEEK. 1.5 mL 5    sacubitril-valsartan (ENTRESTO) 97-103 MG Take 1 tablet by mouth 2 (two) times daily. 180 tablet 3   spironolactone (ALDACTONE) 25 MG tablet TAKE 1 TABLET BY MOUTH EVERY DAY 90 tablet 3   SYMBICORT 80-4.5 MCG/ACT inhaler USE 2 INHALATIONS TWICE A DAY 30.6 g 1   vitamin C (ASCORBIC ACID) 500 MG tablet Take 500 mg by mouth in the morning.     warfarin (COUMADIN) 5 MG tablet TAKE 1 TABLET TO 1 AND 1/2 TABLETS BY MOUTH DAILY AS DIRECTED BY THE COUMADIN CLINIC. 130 tablet 0   BD PEN NEEDLE NANO 2ND GEN 32G X 4 MM MISC USE 4 TIMES A DAY AS DIRECTED     BD PEN NEEDLE NANO 2ND GEN 32G X 4 MM MISC USE 4 TIMES A DAY AS DIRECTED 100 each 4   Continuous Blood Gluc Receiver (FREESTYLE LIBRE 14 DAY READER) DEVI USE AS DIRECTED 1 each 11   Continuous Blood Gluc Sensor (FREESTYLE LIBRE 14 DAY SENSOR) MISC Use as directed by Diabetes educator 2 each 11   thiamine 100 MG tablet Take 1 tablet (100 mg total) by mouth daily. (Patient not taking: Reported on 03/14/2021) 30 tablet 1   No current facility-administered medications for this visit.    REVIEW OF SYSTEMS:    10 Point review of Systems was done is negative except as noted above.  PHYSICAL EXAMINATION: ECOG PERFORMANCE STATUS: 1 - Symptomatic but completely ambulatory  . Vitals:   03/14/21 1310  BP: 124/88  Pulse: 67  Resp: 18  Temp: 97.6 F (36.4 C)  SpO2: 100%   Filed Weights   03/14/21 1310  Weight: 258 lb 11.2 oz (117.3 kg)   .Body mass index is 34.13 kg/m.  GENERAL:alert, in no acute distress and comfortable SKIN: no acute rashes, no significant lesions EYES: conjunctiva are pink and non-injected, sclera anicteric OROPHARYNX: MMM, no exudates, no oropharyngeal erythema or ulceration NECK: supple, no JVD LYMPH:  no palpable lymphadenopathy in the cervical, axillary or inguinal regions LUNGS: clear to auscultation b/l with normal respiratory effort HEART: regular rate & rhythm ABDOMEN:  normoactive bowel sounds , non tender, not  distended. Extremity: Trace pedal edema pedal edema bilaterally. PSYCH: alert & oriented x 3 with fluent speech NEURO: no focal motor/sensory deficits  LABORATORY DATA:  I have reviewed the data as listed  . CBC Latest Ref Rng & Units 01/09/2021 10/18/2020  10/18/2020  WBC 4.0 - 10.5 K/uL 6.1 - 7.2  Hemoglobin 13.0 - 17.0 g/dL 20.1(H) 18.4(H) 18.2(H)  Hematocrit 39.0 - 52.0 % 58.1(H) 54.0(H) 56.0(H)  Platelets 150 - 400 K/uL 199 - 165    . CMP Latest Ref Rng & Units 02/26/2021 01/22/2021 01/09/2021  Glucose 70 - 99 mg/dL 107(H) 112(H) 105(H)  BUN 8 - 23 mg/dL 18 15 15   Creatinine 0.61 - 1.24 mg/dL 1.45(H) 1.40(H) 1.38(H)  Sodium 135 - 145 mmol/L 135 137 132(L)  Potassium 3.5 - 5.1 mmol/L 4.7 4.5 4.3  Chloride 98 - 111 mmol/L 97(L) 100 98  CO2 22 - 32 mmol/L 29 31 27   Calcium 8.9 - 10.3 mg/dL 9.1 9.2 9.4  Total Protein 6.5 - 8.1 g/dL - - 7.2  Total Bilirubin 0.3 - 1.2 mg/dL - - 0.8  Alkaline Phos 38 - 126 U/L - - 54  AST 15 - 41 U/L - - 29  ALT 0 - 44 U/L - - 24     RADIOGRAPHIC STUDIES: I have personally reviewed the radiological images as listed and agreed with the findings in the report. No results found.  ASSESSMENT & PLAN:   65 year old with multiple medical comorbidities including hypertension, dyslipidemia, diabetes, nonischemic cardiomyopathy, sleep apnea using CPAP, persistent atrial fibrillation on anticoagulation with  #1 compound heterozygous state for hereditary hemochromatosis. c.845G>A (p.Cys282Tyr) - Detected, heterozygous  c.187C>G (p.His63Asp) - Detected, heterozygous  c.193A>T (p.Ser65Cys) - Not Detected   Plan -We discussed that his current ferritin level of 89 and iron saturation of 50% is quite reasonable. -We discussed that with his compound heterozygous state he is at low risk for actually developing clinically significant manifestations of hemochromatosis. -His previous elevated ferritin levels appear to not reflect a primary iron overload state but  were likely due to his transaminitis from heavy alcohol use.  His ferritin levels have come down from 1100 to 89 over 2 years without any interventions bleeding blood donations or phlebotomies in tandem with improvement in his liver function tests. -He was recommended to avoid iron supplements and minimize or limit alcohol use completely. -We will look at labs from today to determine need for additional phlebotomies. -Reasonable goal given his other medical issues would be to try to keep his ferritin level less than 100 and iron saturation of less than 60%.  #2 polycythemia -This has developed since February 2022. His current hematocrit is 58 with a hemoglobin level of 20.1. Plan -Discussed that the timeline of development of his polycythemia is consistent with the initiation of Jardiance which can cause secondary polycythemia due to osmotic diuresis and hemoconcentration.  I recommended he discuss this with his primary care physician and cardiologist. -He is notes that he has been using his CPAP machine regularly without any noncompliance. -We will check clonal studies to rule out polycythemia vera.   All of the patients questions were answered with apparent satisfaction. The patient knows to call the clinic with any problems, questions or concerns.  I spent 40 minutes counseling the patient face to face. The total time spent in the appointment was 60 minutes and more than 50% was on counseling and direct patient cares.    Sullivan Lone MD [place] AAHIVMS [facility] Hematology/Oncology [facility]  (Office):       [phone] (Work cell):  [phone] (Fax):           [phone]  03/14/2021 1:36 PM

## 2021-03-20 LAB — JAK2 (INCLUDING V617F AND EXON 12), MPL,& CALR-NEXT GEN SEQ

## 2021-03-27 ENCOUNTER — Other Ambulatory Visit: Payer: Self-pay | Admitting: Internal Medicine

## 2021-03-27 ENCOUNTER — Ambulatory Visit (HOSPITAL_BASED_OUTPATIENT_CLINIC_OR_DEPARTMENT_OTHER): Payer: Medicare HMO | Admitting: Cardiology

## 2021-03-28 ENCOUNTER — Inpatient Hospital Stay: Payer: Medicare HMO | Attending: Hematology | Admitting: Hematology

## 2021-03-28 DIAGNOSIS — Z836 Family history of other diseases of the respiratory system: Secondary | ICD-10-CM | POA: Diagnosis not present

## 2021-03-28 DIAGNOSIS — I428 Other cardiomyopathies: Secondary | ICD-10-CM | POA: Diagnosis not present

## 2021-03-28 DIAGNOSIS — Z800 Family history of malignant neoplasm of digestive organs: Secondary | ICD-10-CM | POA: Diagnosis not present

## 2021-03-28 DIAGNOSIS — D751 Secondary polycythemia: Secondary | ICD-10-CM | POA: Insufficient documentation

## 2021-03-28 DIAGNOSIS — Z841 Family history of disorders of kidney and ureter: Secondary | ICD-10-CM | POA: Insufficient documentation

## 2021-03-28 DIAGNOSIS — E785 Hyperlipidemia, unspecified: Secondary | ICD-10-CM | POA: Diagnosis not present

## 2021-03-28 DIAGNOSIS — G4730 Sleep apnea, unspecified: Secondary | ICD-10-CM | POA: Diagnosis not present

## 2021-03-28 DIAGNOSIS — E119 Type 2 diabetes mellitus without complications: Secondary | ICD-10-CM | POA: Diagnosis not present

## 2021-03-28 DIAGNOSIS — Z8249 Family history of ischemic heart disease and other diseases of the circulatory system: Secondary | ICD-10-CM | POA: Insufficient documentation

## 2021-03-28 DIAGNOSIS — I10 Essential (primary) hypertension: Secondary | ICD-10-CM | POA: Diagnosis not present

## 2021-03-28 DIAGNOSIS — I4891 Unspecified atrial fibrillation: Secondary | ICD-10-CM | POA: Insufficient documentation

## 2021-03-28 DIAGNOSIS — E669 Obesity, unspecified: Secondary | ICD-10-CM | POA: Insufficient documentation

## 2021-03-28 DIAGNOSIS — Z7289 Other problems related to lifestyle: Secondary | ICD-10-CM | POA: Diagnosis not present

## 2021-03-28 NOTE — Progress Notes (Addendum)
[name]   HEMATOLOGY/ONCOLOGY PHONE VISIT NOTE  Date of Service: 04/04/2021  Patient Care Team: [name], MD as PCP - General (Internal Medicine) Buford Dresser, MD as PCP - Cardiology (Cardiology) Thompson Grayer, MD as PCP - Electrophysiology (Cardiology)  CHIEF COMPLAINTS/PURPOSE OF CONSULTATION:  Hereditary hemochromatosis  HISTORY OF PRESENTING ILLNESS:   [name] is a wonderful 65 y.o. male who has been referred to [place] by Dr .Sharlet Salina, Real Cons, MD  for evaluation and management of hereditary hemochromatosis.  Patient has a history of hypertension, atrial fibrillation on anticoagulation, dyslipidemia, morbid obesity, nonischemic cardiomyopathy, sleep apnea, type 2 diabetes who recently had labs which showed a compound heterozygous state for hereditary hemochromatosis with findings of  c.845G>A (p.Cys282Tyr) - Detected, heterozygous  c.187C>G (p.His63Asp) - Detected, heterozygous  c.193A>T (p.Ser65Cys) - Not Detected   Patient is unclear why this testing was done but he on lab review was noted to have ferritin levels of 1100 in August 2020.  His ferritin levels have subsequently progressively come down without any phlebotomies or bleeding or blood donation.  His last ferritin level on 01/22/2021 was down to 89 with an iron saturation of 50%.  He was noted to have abnormal liver functions and heavy alcohol use corresponding with his much higher ferritin levels in the past about 2 years ago.  He notes he has cut back on his alcohol use significantly.  Patient notes no known family history of hereditary hemochromatosis, early liver cirrhosis or sudden cardiac death. He notes he has been compliant with his CPAP use for his sleep apnea. Does not note any specific joint problems or skin color changes.  Recent labs on 01/09/2021 also showed significant polycythemia with a hemoglobin level of 20.1 and a hematocrit of 58.1 with normal WBC count of 6.1k and normal  platelets of 199k His polycythemia appears to be progressive since his last normal hematocrit of 49 on 08/05/2020.  Patient notes his increasing hematocrit seems to be corresponding with his start of Jardiance by his cardiologist. We discussed the etiologies of polycythemia and testing to see if there is any concern for polycythemia vera.  Patient notes no fevers no chills no night sweats.  No history of VTE.   INTERVAL HISTORY  .I connected with [name] on .03/28/2021 at  9:40 AM EDT by telephone visit and verified that I am speaking with the correct person using two identifiers.   I discussed the limitations, risks, security and privacy concerns of performing an evaluation and management service by telemedicine and the availability of in-person appointments. I also discussed with the patient that there may be a patient responsible charge related to this service. The patient expressed understanding and agreed to proceed.   Other persons participating in the visit and their role in the encounter: none   Patient's location: Home  Provider's location: [place]  Patient was called for a phone visit to discuss lab work-up done for evaluation of his newly diagnosed compound heterozygous hemochromatosis polycythemia. He notes no new symptoms since his last clinic visit. CBC shows hemoglobin of 20.3 hematocrit of 60 with normal WBC count and platelets.  We discussed that his JAK2 V617F mutation test is negative, JAK2 exon 12 mutation negative.  And the rest of his myeloid panel is negative for any evidence of myeloproliferative neoplasm or polycythemia vera.  We discussed that the likelihood of polycythemia vera is less than 1% given his negative clonal testing studies.  We again discussed that his  polycythemia appears to be secondary and the timeline suggests this is most likely related to his Jardiance use.  Additional factors would be diuresis with hemoconcentration.  He  also had iron studies to follow-up on his compound heterozygous hemochromatosis state his ferritin has spontaneously improved to 116 down from a peak of 1107 in August 2020.  This suggests that the initial elevation of ferritin was likely due to transaminitis. Iron saturation is 61% with decreased finding binding capacity.   MEDICAL HISTORY:  Past Medical History:  Diagnosis Date   Asthma    as a teenager - does not use an inhaler although pt said he has an albuteral inhaler   Atrial fibrillation ([place])    persistant 02/2009   Atrial flutter ([facility])    s/p CTI ablation 08/06/10   Cataract    Chronic rhinitis    Colon polyp    Congestive heart failure ([place])    Diverticulosis    colonoscopy 04/03/2009   Hyperlipidemia    Hypertension    Morbid obesity ([place])    target weight = 219  for BMI < 30   Nonischemic cardiomyopathy ([place])    tachycardia mediated   Seasonal allergies    Sleep apnea    original 2001 - wears C-PaP   Type 2 diabetes mellitus ([place])     SURGICAL HISTORY: Past Surgical History:  Procedure Laterality Date   ATRIAL FIBRILLATION ABLATION N/A 12/14/2018   Procedure: ATRIAL FIBRILLATION ABLATION;  Surgeon: Thompson Grayer, MD;  Location: [place] [facility];  Service: Cardiovascular;  Laterality: N/A;   ATRIAL FIBRILLATION ABLATION N/A 10/18/2020   Procedure: ATRIAL FIBRILLATION ABLATION;  Surgeon: Thompson Grayer, MD;  Location: [place] [facility];  Service: Cardiovascular;  Laterality: N/A;   atrial flutter ablation  08/06/10   CARDIOVERSION N/A 07/05/2018   Procedure: CARDIOVERSION;  Surgeon: Buford Dresser, MD;  Location: [facility] ENDOSCOPY;  Service: Cardiovascular;  Laterality: N/A;   CARDIOVERSION N/A 08/08/2020   Procedure: CARDIOVERSION;  Surgeon: Larey Dresser, MD;  Location: [facility] ENDOSCOPY;  Service: Cardiovascular;  Laterality: N/A;   CARDIOVERSION N/A 08/29/2020   Procedure: CARDIOVERSION;  Surgeon: Jolaine Artist, MD;  Location: [place];  Service:  Cardiovascular;  Laterality: N/A;   COLONOSCOPY     POLYPECTOMY     RIGHT HEART CATH N/A 08/06/2020   Procedure: RIGHT HEART CATH;  Surgeon: Larey Dresser, MD;  Location: [place] [facility];  Service: Cardiovascular;  Laterality: N/A;   TEE WITHOUT CARDIOVERSION N/A 12/14/2018   Procedure: TRANSESOPHAGEAL ECHOCARDIOGRAM (TEE);  Surgeon: Thompson Grayer, MD;  Location: [place] [facility];  Service: Cardiovascular;  Laterality: N/A;   TEE WITHOUT CARDIOVERSION N/A 08/03/2020   Procedure: TRANSESOPHAGEAL ECHOCARDIOGRAM (TEE);  Surgeon: Sanda Klein, MD;  Location: [place];  Service: Cardiovascular;  Laterality: N/A;   TEE WITHOUT CARDIOVERSION N/A 08/08/2020   Procedure: TRANSESOPHAGEAL ECHOCARDIOGRAM (TEE);  Surgeon: Larey Dresser, MD;  Location: [facility] ENDOSCOPY;  Service: Cardiovascular;  Laterality: N/A;   TEE WITHOUT CARDIOVERSION N/A 10/18/2020   Procedure: TRANSESOPHAGEAL ECHOCARDIOGRAM (TEE);  Surgeon: Jerline Pain, MD;  Location: [facility] ENDOSCOPY;  Service: Cardiovascular;  Laterality: N/A;    SOCIAL HISTORY: Social History   Socioeconomic History   Marital status: Single    Spouse name: Not on file   Number of children: 0   Years of education: Not on file   Highest education level: Not on file  Occupational History   Occupation: Sales  Tobacco Use   Smoking status: Never   Smokeless  tobacco: Never  Vaping Use   Vaping Use: Never used  Substance and Sexual Activity   Alcohol use: Yes    Alcohol/week: 21.0 standard drinks    Types: 21 Cans of beer per week    Comment: previously quite heavy, working on cessation    Drug use: No   Sexual activity: Not on file  Other Topics Concern   Not on file  Social History Narrative   Lives in [place]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   Social Determinants of Health   Financial Resource Strain: Not on file  Food Insecurity: Not on file  Transportation Needs: Not on file  Physical Activity: Not on file  Stress: Not on file  Social  Connections: Not on file  Intimate Partner Violence: Not on file    FAMILY HISTORY: Family History  Problem Relation Age of Onset   Hypertension Mother    Kidney failure Mother        Due to sepsis   COPD Father    Colon cancer Brother        dx in 36's   Esophageal cancer Neg Hx    Rectal cancer Neg Hx    Stomach cancer Neg Hx     ALLERGIES:  has No Known Allergies.  MEDICATIONS:  Current Outpatient Medications  Medication Sig Dispense Refill   albuterol (PROVENTIL HFA;VENTOLIN HFA) 108 (90 Base) MCG/ACT inhaler Inhale 2 puffs into the lungs every 6 (six) hours as needed for wheezing or shortness of breath.     amiodarone (PACERONE) 200 MG tablet Take 1 tablet (200 mg total) by mouth daily. Needs appt 60 tablet 2   BD PEN NEEDLE NANO 2ND GEN 32G X 4 MM MISC USE 4 TIMES A DAY AS DIRECTED     BD PEN NEEDLE NANO 2ND GEN 32G X 4 MM MISC USE 4 TIMES A DAY AS DIRECTED 100 each 4   carvedilol (COREG) 3.125 MG tablet Take 1 tablet (3.125 mg total) by mouth 2 (two) times daily with a meal.  4   cholecalciferol (VITAMIN D3) 25 MCG (1000 UNIT) tablet Take 1,000 Units by mouth daily.     Continuous Blood Gluc Receiver (FREESTYLE LIBRE 14 DAY READER) DEVI USE AS DIRECTED 1 each 11   Continuous Blood Gluc Sensor (FREESTYLE LIBRE 14 DAY SENSOR) MISC Use as directed by Diabetes educator 2 each 11   doxylamine, Sleep, (UNISOM) 25 MG tablet Take 50 mg by mouth at bedtime.     empagliflozin (JARDIANCE) 10 MG TABS tablet Take 1 tablet (10 mg total) by mouth daily. 30 tablet 5   furosemide (LASIX) 20 MG tablet Take 10 mg by mouth daily.     insulin aspart (NOVOLOG) 100 UNIT/ML FlexPen Inject 8-12 Units into the skin 3 (three) times daily with meals. 30 mL 3   Insulin Degludec (TRESIBA FLEXTOUCH [place]) Inject 32 Units into the skin daily.     levothyroxine (SYNTHROID) 50 MCG tablet TAKE 1 TABLET BY MOUTH EVERY DAY BEFORE BREAKFAST 90 tablet 0   mometasone (NASONEX) 50 MCG/ACT nasal spray Place 2 sprays  into the nose daily as needed (allergies or rhinitis).     Multiple Vitamins-Minerals (MULTIVITAMIN WITH MINERALS) tablet Take 2 tablets by mouth at bedtime. Chewable     OZEMPIC, 0.25 OR 0.5 MG/DOSE, 2 MG/1.5ML SOPN INJECT 0.5 MG INTO THE SKIN ONCE A WEEK. 1.5 mL 5   sacubitril-valsartan (ENTRESTO) 97-103 MG Take 1 tablet by mouth 2 (two) times daily. 180 tablet 3  spironolactone (ALDACTONE) 25 MG tablet TAKE 1 TABLET BY MOUTH EVERY DAY 90 tablet 3   SYMBICORT 80-4.5 MCG/ACT inhaler USE 2 INHALATIONS TWICE A DAY 30.6 g 1   thiamine 100 MG tablet Take 1 tablet (100 mg total) by mouth daily. (Patient not taking: Reported on 03/14/2021) 30 tablet 1   vitamin C (ASCORBIC ACID) 500 MG tablet Take 500 mg by mouth in the morning.     warfarin (COUMADIN) 5 MG tablet TAKE 1 TABLET TO 1 AND 1/2 TABLETS BY MOUTH DAILY AS DIRECTED BY THE COUMADIN CLINIC. 130 tablet 0   No current facility-administered medications for this visit.    REVIEW OF SYSTEMS:    10 Point review of Systems was done is negative except as noted above.  PHYSICAL EXAMINATION: Telemedicine visit  LABORATORY DATA:  I have reviewed the data as listed  . CBC Latest Ref Rng & Units 03/14/2021 01/09/2021 10/18/2020  WBC 4.0 - 10.5 K/uL 7.4 6.1 -  Hemoglobin 13.0 - 17.0 g/dL 20.3(H) 20.1(H) 18.4(H)  Hematocrit 39.0 - 52.0 % 60.1(H) 58.1(H) 54.0(H)  Platelets 150 - 400 K/uL 181 199 -    . CMP Latest Ref Rng & Units 03/14/2021 02/26/2021 01/22/2021  Glucose 70 - 99 mg/dL 119(H) 107(H) 112(H)  BUN 8 - 23 mg/dL 19 18 15   Creatinine 0.61 - 1.24 mg/dL 1.46(H) 1.45(H) 1.40(H)  Sodium 135 - 145 mmol/L 138 135 137  Potassium 3.5 - 5.1 mmol/L 4.9 4.7 4.5  Chloride 98 - 111 mmol/L 99 97(L) 100  CO2 22 - 32 mmol/L 28 29 31   Calcium 8.9 - 10.3 mg/dL 10.3 9.1 9.2  Total Protein 6.5 - 8.1 g/dL 7.8 - -  Total Bilirubin 0.3 - 1.2 mg/dL 1.0 - -  Alkaline Phos 38 - 126 U/L 82 - -  AST 15 - 41 U/L 23 - -  ALT 0 - 44 U/L 18 - -     RADIOGRAPHIC  STUDIES: I have personally reviewed the radiological images as listed and agreed with the findings in the report. No results found.  ASSESSMENT & PLAN:   65 year old with multiple medical comorbidities including hypertension, dyslipidemia, diabetes, nonischemic cardiomyopathy, sleep apnea using CPAP, persistent atrial fibrillation on anticoagulation with  #1 compound heterozygous state for hereditary hemochromatosis. c.845G>A (p.Cys282Tyr) - Detected, heterozygous  c.187C>G (p.His63Asp) - Detected, heterozygous  c.193A>T (p.Ser65Cys) - Not Detected  His previous elevated ferritin levels appear to not reflect a primary iron overload state but were likely due to his transaminitis from heavy alcohol use.  His ferritin levels have come down from 1100 to 89 over 2 years without any interventions bleeding blood donations or phlebotomies in tandem with improvement in his liver function tests. Plan -We discussed that his current ferritin level of 116 and iron saturation of 60% is immediately concerning for organ injury. -We discussed that with his compound heterozygous state he is at low risk for actually developing clinically significant manifestations of hemochromatosis. --He was recommended to avoid iron supplements and minimize or limit alcohol use completely. -Given his unless concerning genotype for hemochromatosis with consideration of his other medical issues including nonischemic cardiomyopathy he may not tolerate significant volume shifts, and since his ferritin levels have improved by themselves and he is on Coumadin with increased risk of bleeding... We will be more conservative about recommending therapeutic phlebotomies. -Reasonable goal to try to keep his ferritin level less than 200 and iron saturation of less than 75%.  #2  Secondary polycythemia -This has developed since February  2022. Jak2 V617F mutation and Jak2 Exon 12 mutation testing is negative which makes polycythemia vera less  than 1% likely. His current hematocrit is 60 with a hemoglobin level of 20.3 The timeline of his presentation is in keeping with the initiation of Jardiance which can cause osmotic diuresis and secondary polycythemia from hemoconcentration. Patient is also on diuretic therapy, has congestive heart failure due to nonischemic cardiomyopathy, has sleep apnea and potentially COPD. Plan -Discussed he does not appear to have polycythemia vera and that his polycythemia is secondary. -He is already on anticoagulation that would be protective. -We discussed that the treatment of secondary polycythemia is to address the underlying etiology of the polycythemia which in his case is the use of Jardiance, possible hemoconcentration from diuretics.  He was recommended to discuss this with his cardiologist and primary care physician. -Optimization of sleep apnea treatment PCP to consider outpatient lung function testing . -No indication for therapeutic phlebotomy from hematology standpoint.  We discussed that if this were polycythemia vera his hematocrit goals would be much more stringent with an attempt to keep the hematocrit closer to 45% -We shall see him back in 4 months to track his counts after the above factors have been addressed. -Please forward our recommendations to his cardiology team and primary care physician.  All of the patients questions were answered with apparent satisfaction. The patient knows to call the clinic with any problems, questions or concerns.  . The total time spent in the appointment was 25 minutes and more than 50% was on counseling and direct patient cares.    Sullivan Lone MD MS AAHIVMS [facility] Hematology/Oncology Physician [facility]     .

## 2021-03-29 DIAGNOSIS — G4733 Obstructive sleep apnea (adult) (pediatric): Secondary | ICD-10-CM | POA: Diagnosis not present

## 2021-04-02 ENCOUNTER — Ambulatory Visit (INDEPENDENT_AMBULATORY_CARE_PROVIDER_SITE_OTHER): Payer: Medicare HMO | Admitting: *Deleted

## 2021-04-02 ENCOUNTER — Other Ambulatory Visit: Payer: Self-pay

## 2021-04-02 DIAGNOSIS — I513 Intracardiac thrombosis, not elsewhere classified: Secondary | ICD-10-CM | POA: Diagnosis not present

## 2021-04-02 DIAGNOSIS — I4891 Unspecified atrial fibrillation: Secondary | ICD-10-CM

## 2021-04-02 LAB — POCT INR: INR: 2.3 (ref 2.0–3.0)

## 2021-04-02 NOTE — Patient Instructions (Signed)
Description   Today take 2 tablets then continue taking warfarin 1 tablet daily except for 1.5 tablets on Sundays, Tuesdays and Thursdays. Recheck INR in 3 weeks. Coumadin Clinic [phone].

## 2021-04-10 ENCOUNTER — Ambulatory Visit (HOSPITAL_BASED_OUTPATIENT_CLINIC_OR_DEPARTMENT_OTHER): Payer: Medicare HMO | Admitting: Cardiology

## 2021-04-10 ENCOUNTER — Encounter (HOSPITAL_BASED_OUTPATIENT_CLINIC_OR_DEPARTMENT_OTHER): Payer: Self-pay | Admitting: Cardiology

## 2021-04-10 ENCOUNTER — Other Ambulatory Visit: Payer: Self-pay

## 2021-04-10 VITALS — BP 130/78 | HR 62 | Ht 73.0 in | Wt 260.0 lb

## 2021-04-10 DIAGNOSIS — I428 Other cardiomyopathies: Secondary | ICD-10-CM

## 2021-04-10 DIAGNOSIS — E1169 Type 2 diabetes mellitus with other specified complication: Secondary | ICD-10-CM | POA: Diagnosis not present

## 2021-04-10 DIAGNOSIS — I5022 Chronic systolic (congestive) heart failure: Secondary | ICD-10-CM | POA: Diagnosis not present

## 2021-04-10 DIAGNOSIS — I480 Paroxysmal atrial fibrillation: Secondary | ICD-10-CM

## 2021-04-10 DIAGNOSIS — E785 Hyperlipidemia, unspecified: Secondary | ICD-10-CM

## 2021-04-10 DIAGNOSIS — I10 Essential (primary) hypertension: Secondary | ICD-10-CM

## 2021-04-10 DIAGNOSIS — Z7189 Other specified counseling: Secondary | ICD-10-CM

## 2021-04-10 NOTE — Patient Instructions (Signed)
Medication Instructions:  I would continue the melatonin at bedtime. Once in a while, not every night, you can take tylenol PM or benadryl (the active ingredient is diphenhydramine). Take a 50 mg dose at night, but make sure you can sleep in the next day as you can be groggy for a bit.  *If you need a refill on your cardiac medications before your next appointment, please call your pharmacy*   Lab Work: None ordered today   Testing/Procedures: None ordered today   Follow-Up: At [facility], you and your health needs are our priority.  As part of our continuing mission to provide you with exceptional heart care, we have created designated Provider Care Teams.  These Care Teams include your primary Cardiologist (physician) and Advanced Practice Providers (APPs -  Physician Assistants and Nurse Practitioners) who all work together to provide you with the care you need, when you need it.  We recommend signing up for the patient portal called "MyChart".  Sign up information is provided on this After Visit Summary.  MyChart is used to connect with patients for Virtual Visits (Telemedicine).  Patients are able to view lab/test results, encounter notes, upcoming appointments, etc.  Non-urgent messages can be sent to your provider as well.   To learn more about what you can do with MyChart, go to [email or web address].    Your next appointment:   6 month(s)  The format for your next appointment:   In Person  Provider:   Buford Dresser, MD

## 2021-04-10 NOTE — Progress Notes (Signed)
Cardiology Office Note:    Date:  04/10/2021   ID:  [name], DOB June 11, 1956, MRN [id number]  PCP:  [name], MD  Cardiologist:  Buford Dresser, MD PhD  Referring MD: [name], *   CC: follow up  History of Present Illness:    [name] is a 65 y.o. male with a hx of persistent atrial fibrillation, atrial flutter with prior ablation in 2012, prior cardioversions, HTN, chronic systolic heart failure (thought to be tachycardia induced) with EF 30-35% on tte 04/2018, OSA who is seen in follow up today. I first saw him as a new consult 12/28/18.  Today: Doing great, other than sleep. Golfing, fishing, biking without limitations. Sugars have been well controlled, tolerated all medications. Had repeat echo in July, reviewed with him. Follows with Dr. Aundra Dubin and Dr. Rayann Heman. Finally got his CPAP, using now.   Denies chest pain, shortness of breath at rest or with normal exertion. No PND, orthopnea, LE edema or unexpected weight gain. No syncope or palpitations. No bleeding issues.  Past Medical History:  Diagnosis Date   Asthma    as a teenager - does not use an inhaler although pt said he has an albuteral inhaler   Atrial fibrillation (HCC)    persistant 02/2009   Atrial flutter ([place])    s/p CTI ablation 08/06/10   Cataract    Chronic rhinitis    Colon polyp    Congestive heart failure ([place])    Diverticulosis    colonoscopy 04/03/2009   Hyperlipidemia    Hypertension    Morbid obesity ([place])    target weight = 219  for BMI < 30   Nonischemic cardiomyopathy (HCC)    tachycardia mediated   Seasonal allergies    Sleep apnea    original 2001 - wears C-PaP   Type 2 diabetes mellitus ([place])     Past Surgical History:  Procedure Laterality Date   ATRIAL FIBRILLATION ABLATION N/A 12/14/2018   Procedure: ATRIAL FIBRILLATION ABLATION;  Surgeon: Thompson Grayer, MD;  Location: [place] [facility];  Service: Cardiovascular;  Laterality: N/A;    ATRIAL FIBRILLATION ABLATION N/A 10/18/2020   Procedure: ATRIAL FIBRILLATION ABLATION;  Surgeon: Thompson Grayer, MD;  Location: [place] [facility];  Service: Cardiovascular;  Laterality: N/A;   atrial flutter ablation  08/06/10   CARDIOVERSION N/A 07/05/2018   Procedure: CARDIOVERSION;  Surgeon: Buford Dresser, MD;  Location: [facility] ENDOSCOPY;  Service: Cardiovascular;  Laterality: N/A;   CARDIOVERSION N/A 08/08/2020   Procedure: CARDIOVERSION;  Surgeon: Larey Dresser, MD;  Location: [facility] ENDOSCOPY;  Service: Cardiovascular;  Laterality: N/A;   CARDIOVERSION N/A 08/29/2020   Procedure: CARDIOVERSION;  Surgeon: Jolaine Artist, MD;  Location: [place];  Service: Cardiovascular;  Laterality: N/A;   COLONOSCOPY     POLYPECTOMY     RIGHT HEART CATH N/A 08/06/2020   Procedure: RIGHT HEART CATH;  Surgeon: Larey Dresser, MD;  Location: [place] [facility];  Service: Cardiovascular;  Laterality: N/A;   TEE WITHOUT CARDIOVERSION N/A 12/14/2018   Procedure: TRANSESOPHAGEAL ECHOCARDIOGRAM (TEE);  Surgeon: Thompson Grayer, MD;  Location: [place] [facility];  Service: Cardiovascular;  Laterality: N/A;   TEE WITHOUT CARDIOVERSION N/A 08/03/2020   Procedure: TRANSESOPHAGEAL ECHOCARDIOGRAM (TEE);  Surgeon: Sanda Klein, MD;  Location: [place];  Service: Cardiovascular;  Laterality: N/A;   TEE WITHOUT CARDIOVERSION N/A 08/08/2020   Procedure: TRANSESOPHAGEAL ECHOCARDIOGRAM (TEE);  Surgeon: Larey Dresser, MD;  Location: [facility] ENDOSCOPY;  Service: Cardiovascular;  Laterality: N/A;   TEE WITHOUT CARDIOVERSION N/A 10/18/2020   Procedure: TRANSESOPHAGEAL ECHOCARDIOGRAM (TEE);  Surgeon: Jerline Pain, MD;  Location: [facility] ENDOSCOPY;  Service: Cardiovascular;  Laterality: N/A;    Current Medications: Current Outpatient Medications on File Prior to Visit  Medication Sig   albuterol (PROVENTIL HFA;VENTOLIN HFA) 108 (90 Base) MCG/ACT inhaler Inhale 2 puffs into the lungs every 6 (six) hours as needed for  wheezing or shortness of breath.   amiodarone (PACERONE) 200 MG tablet Take 1 tablet (200 mg total) by mouth daily. Needs appt   BD PEN NEEDLE NANO 2ND GEN 32G X 4 MM MISC USE 4 TIMES A DAY AS DIRECTED   BD PEN NEEDLE NANO 2ND GEN 32G X 4 MM MISC USE 4 TIMES A DAY AS DIRECTED   carvedilol (COREG) 3.125 MG tablet Take 1 tablet (3.125 mg total) by mouth 2 (two) times daily with a meal.   cholecalciferol (VITAMIN D3) 25 MCG (1000 UNIT) tablet Take 1,000 Units by mouth daily.   Continuous Blood Gluc Receiver (FREESTYLE LIBRE 14 DAY READER) DEVI USE AS DIRECTED   Continuous Blood Gluc Sensor (FREESTYLE LIBRE 14 DAY SENSOR) MISC Use as directed by Diabetes educator   doxylamine, Sleep, (UNISOM) 25 MG tablet Take 50 mg by mouth at bedtime.   empagliflozin (JARDIANCE) 10 MG TABS tablet Take 1 tablet (10 mg total) by mouth daily.   furosemide (LASIX) 20 MG tablet Take 10 mg by mouth daily.   insulin aspart (NOVOLOG) 100 UNIT/ML FlexPen Inject 8-12 Units into the skin 3 (three) times daily with meals.   Insulin Degludec (TRESIBA FLEXTOUCH [place]) Inject 32 Units into the skin daily.   levothyroxine (SYNTHROID) 50 MCG tablet TAKE 1 TABLET BY MOUTH EVERY DAY BEFORE BREAKFAST   mometasone (NASONEX) 50 MCG/ACT nasal spray Place 2 sprays into the nose daily as needed (allergies or rhinitis).   Multiple Vitamins-Minerals (MULTIVITAMIN WITH MINERALS) tablet Take 2 tablets by mouth at bedtime. Chewable   OZEMPIC, 0.25 OR 0.5 MG/DOSE, 2 MG/1.5ML SOPN INJECT 0.5 MG INTO THE SKIN ONCE A WEEK.   sacubitril-valsartan (ENTRESTO) 97-103 MG Take 1 tablet by mouth 2 (two) times daily.   spironolactone (ALDACTONE) 25 MG tablet TAKE 1 TABLET BY MOUTH EVERY DAY   SYMBICORT 80-4.5 MCG/ACT inhaler USE 2 INHALATIONS TWICE A DAY   thiamine 100 MG tablet Take 1 tablet (100 mg total) by mouth daily.   vitamin C (ASCORBIC ACID) 500 MG tablet Take 500 mg by mouth in the morning.   warfarin (COUMADIN) 5 MG tablet TAKE 1 TABLET TO 1 AND  1/2 TABLETS BY MOUTH DAILY AS DIRECTED BY THE COUMADIN CLINIC.   No current facility-administered medications on file prior to visit.     Allergies:   Patient has no known allergies.   Social History   Tobacco Use   Smoking status: Never   Smokeless tobacco: Never  Vaping Use   Vaping Use: Never used  Substance Use Topics   Alcohol use: Yes    Alcohol/week: 21.0 standard drinks    Types: 21 Cans of beer per week    Comment: previously quite heavy, working on cessation    Drug use: No     Family History: The patient's family history includes COPD in his father; Colon cancer in his brother; Hypertension in his mother; Kidney failure in his mother. There is no history of Esophageal cancer, Rectal cancer, or Stomach cancer.  ROS:   Please see the history of present illness.  Additional pertinent ROS otherwise unremarkable.  EKGs/Labs/Other Studies Reviewed:    The following studies were reviewed today: Echo 01/09/21 1. Left ventricular ejection fraction, by estimation, is 40 to 45%. The  left ventricle has mildly decreased function. The left ventricle  demonstrates global hypokinesis. There is moderate left ventricular  hypertrophy. Left ventricular diastolic  parameters are consistent with Grade I diastolic dysfunction (impaired  relaxation).   2. Right ventricular systolic function is normal. The right ventricular  size is normal. Tricuspid regurgitation signal is inadequate for assessing  PA pressure.   3. Left atrial size was moderately dilated.   4. Right atrial size was moderately dilated.   5. The mitral valve is abnormal. Moderate mitral valve regurgitation. No  evidence of mitral stenosis.   6. The aortic valve is tricuspid. Aortic valve regurgitation is not  visualized. Mild aortic valve sclerosis is present, with no evidence of  aortic valve stenosis.   7. Aortic dilatation noted. There is mild dilatation of the aortic root,  measuring 42 mm.   Atrial  Fibrillation Ablation 10/18/2020: CONCLUSIONS: 1. Atrial fibrillation upon presentation.   2. Intracardiac echo reveals a large sized left atrium with four separate pulmonary veins without evidence of pulmonary vein stenosis. 3. Successful electrical isolation and anatomical encircling of all four pulmonary veins with radiofrequency current.  A WACA approach was used 3. Additional left atrial ablation was performed with a standard box lesion created along the posterior wall of the left atrium 4. Atrial fibrillation successfully cardioverted to sinus rhythm. 5. No early apparent complications.   Additional instructions: The patient takes coumadin for stroke prevention.  We will resume coumadin this evening.  The patient should not interrupt oral anticoagulation for any reason for 3 months post ablation.  Right Heart Cath 08/06/2020: 1. Filling pressures remain elevated. 2. Cardiac output is low even on milrinone 0.25.  TTE 09/04/19 1. Left ventricular ejection fraction, by estimation, is 40 to 45%. The  left ventricle has mildly decreased function. The left ventricle  demonstrates global hypokinesis. There is severe left ventricular  hypertrophy, asymmetric and septal predominant  (parasternal views of the septum raise question of HOCM or infiltrative  cardiomyopathy - cardiac MRI could be considered if clinically  appropriate). Left ventricular diastolic parameters are consistent with  Grade I diastolic dysfunction (impaired  relaxation).   2. Right ventricular systolic function is normal. The right ventricular  size is normal. Tricuspid regurgitation signal is inadequate for assessing  PA pressure.   3. Left atrial size was mildly dilated.   4. The mitral valve is grossly normal. Trivial mitral valve  regurgitation.   5. The aortic valve was not well visualized. Aortic valve regurgitation  is not visualized.   6. Aortic dilatation noted. There is mild dilatation of the ascending   aorta.   7. The inferior vena cava is normal in size with greater than 50%  respiratory variability, suggesting right atrial pressure of 3 mmHg.   Lexiscan 04/07/19 Nuclear stress EF: 49%. The left ventricular ejection fraction is mildly decreased (45-54%). No T wave inversion was noted during stress. There was no ST segment deviation noted during stress. Defect 1: There is a small defect of mild severity. This is an intermediate risk study.   Small size, mild intensity fixed apical perfusion defect, likely thinning. No significant reversible ischemia. Dilated LV with mild hypokinesis - LVEF 49%. This is an intermediate risk study due to decreased LV function.  TTE 05/03/18 Left ventricle: The cavity size was  normal. There was severe   concentric hypertrophy. Systolic function was moderately to   severely reduced. The estimated ejection fraction was in the   range of 30% to 35%. Diffuse hypokinesis. The study was not   technically sufficient to allow evaluation of LV diastolic   dysfunction due to atrial fibrillation. - Aortic valve: There was no regurgitation. - Aortic root: The aortic root was normal in size. - Mitral valve: There was trivial regurgitation. - Left atrium: The atrium was severely dilated. - Right ventricle: Systolic function was normal. - Right atrium: The atrium was moderately dilated. - Tricuspid valve: There was trivial regurgitation. - Pulmonic valve: Transvalvular velocity was within the normal   range. There was no evidence for stenosis. - Pulmonary arteries: Systolic pressure was within the normal   range. - Inferior vena cava: The vessel was normal in size. The   respirophasic diameter changes were in the normal range (= 50%),   consistent with normal central venous pressure. - Pericardium, extracardiac: There was no pericardial effusion.   Impressions:  - When compared to the prior study from 04/02/2017 LVEF has   decreased from 55-60% to 30-35%  with diffuse hypokinesis.  TEE 12/14/18  1. The left ventricle has moderate-severely reduced systolic function, with an ejection fraction of 30-35%. The cavity size was moderately dilated. Left ventrical global hypokinesis without regional wall motion abnormalities.  2. The right ventricle has normal systolc function. The cavity was normal. There is no increase in right ventricular wall thickness.  3. Left atrial size was severely dilated.  4. Dense smoke in atria and appendage. Stagnant pre thrombus and likely thrombus in mid to distal LAA High risk for embolic event EP procedure cancelled.  5. Right atrial size was moderately dilated.  6. The mitral valve is grossly normal. Mild thickening of the mitral valve leaflet. Mild calcification of the mitral valve leaflet. Mitral valve regurgitation is mild to moderate by color flow Doppler.  7. Restricted posterior leaflet motion.  8. The aortic valve is tricuspid Moderate thickening of the aortic valve Sclerosis without any evidence of stenosis of the aortic valve. Aortic valve regurgitation is trivial by color flow Doppler.  9. The aortic root is normal in size and structure.  EKG:   Personally reviewed today 04/10/21 SR, 1st degree AV block, IVCD 4/22-Atrial fibrillation, Rate 67 bpm 2/22- Atrial fibrillation with RVR at 123 bpm, IVCD  Recent Labs: 08/03/2020: B Natriuretic Peptide 1,668.7 08/10/2020: Magnesium 2.2 01/09/2021: TSH 3.401 03/14/2021: ALT 18; BUN 19; Creatinine 1.46; Hemoglobin 20.3; Platelet Count 181; Potassium 4.9; Sodium 138  Recent Lipid Panel    Component Value Date/Time   CHOL 143 05/16/2020 1035   TRIG 107.0 05/16/2020 1035   HDL 53.20 05/16/2020 1035   CHOLHDL 3 05/16/2020 1035   VLDL 21.4 05/16/2020 1035   LDLCALC 69 05/16/2020 1035   LDLDIRECT 81.0 08/31/2015 0905    Physical Exam:    VS:  BP 130/78   Pulse 62   Ht 6\' 1"  (1.854 m)   Wt 260 lb (117.9 kg)   SpO2 96%   BMI 34.30 kg/m     Wt Readings from  Last 3 Encounters:  04/10/21 260 lb (117.9 kg)  03/14/21 258 lb 11.2 oz (117.3 kg)  02/05/21 256 lb (116.1 kg)  GEN: Well nourished, well developed in no acute distress HEENT: Normal, moist mucous membranes NECK: No JVD CARDIAC: regular rhythm, normal S1 and S2, no rubs or gallops. No murmur. VASCULAR: Radial and DP pulses  2+ bilaterally. No carotid bruits RESPIRATORY:  Clear to auscultation without rales, wheezing or rhonchi  ABDOMEN: Soft, non-tender, non-distended MUSCULOSKELETAL:  Ambulates independently SKIN: Warm and dry, no edema NEUROLOGIC:  Alert and oriented x 3. No focal neuro deficits noted. PSYCHIATRIC:  Normal affect    ASSESSMENT:    1. Paroxysmal atrial fibrillation (HCC)   2. NICM (nonischemic cardiomyopathy) ([place])   3. Chronic systolic heart failure ([place])   4. Type 2 diabetes mellitus with hyperlipidemia ([place])   5. Essential hypertension   6. Cardiac risk counseling      PLAN:    History of atrial fibrillation and atrial flutter, s/p prior cardioversions and CTI -sinus rhythm today -CHADSVASC=3 -Has OSA, encouraged adherence to CPAP -s/p atrial fibrillation ablation 10/18/20 with Dr. Rayann Heman, had TEE prior -continue coumadin given LAA thrombus with both rivaroxaban and dabigatran. -continue amiodarone, carvedilol, digoxin, defer management of these to Dr. Rayann Heman given ablation -we have discussed pacemaker/ICD indications  Nonischemic cardiomyopathy, with chronic systolic and diastolic heart failure:  -euvolemic, NYHA class I today -most recent echo with EF improved to 45-50% -has MRI and repeat echo pending per Dr. Aundra Dubin -does have prior history of hospitalization for heart failure requiring milrinone -Lexiscan done, no reversible ischemia. Have not been able to do coronary angiography due to renal disease -appreciate heart failure clinic assistance -continue carvedilol, digoxin, amiodarone, empagliflozin, hydralazine, isosorbide, entresto, spironolactone.   -on GLP1RA and SGLT2i given diabetes. Followed by Dr. Renne Crigler in endocrinology.  Medication history: -amiodarone and carvedilol were stopped during admission 08/2019 due to symptomatic bradycardia, in the setting of hyperosmolar hyperglycemia. Monitor for bradycardia on these meds -came off hydralazine 50 mg BID, imdur 30 mg daily due to hypotension. Monitor for hypotension  Hypertension: -continue medications as above  Type II diabetes with hyperlipidemia: -on semaglutide (GLP1RA), empagliflozin (SGLT2i) and insulin -no aspirin as he is on coumadin -history of elevated LFTs, rosuvastatin previously held. Would restart if amiodarone planned to be stopped, then recheck LFTs  Cardiac risk counseling and prevention recommendations: -recommend heart healthy/Mediterranean diet, with whole grains, fruits, vegetable, fish, lean meats, nuts, and olive oil. Limit salt. -recommend moderate walking, 3-5 times/week for 30-50 minutes each session. Aim for at least 150 minutes.week. Goal should be pace of 3 miles/hours, or walking 1.5 miles in 30 minutes -recommend avoidance of tobacco products. Avoid excess alcohol. -ASCVD risk score: The 10-year ASCVD risk score (Arnett DK, et al., 2019) is: 23.1%   Values used to calculate the score:     Age: 64 years     Sex: Male     Is Non-Hispanic African American: No     Diabetic: Yes     Tobacco smoker: No     Systolic Blood Pressure: 425 mmHg     Is BP treated: Yes     HDL Cholesterol: 53.2 mg/dL     Total Cholesterol: 143 mg/dL    Plan for follow up: 6 months to stagger visits  Buford Dresser, MD, PhD, [facility]   Medication Adjustments/Labs and Tests Ordered: Current medicines are reviewed at length with the patient today.  Concerns regarding medicines are outlined above.  Orders Placed This Encounter  Procedures   EKG 12-Lead    No orders of the defined types were placed in this encounter.   Patient  Instructions  Medication Instructions:  I would continue the melatonin at bedtime. Once in a while, not every night, you can take tylenol PM or benadryl (the active ingredient is  diphenhydramine). Take a 50 mg dose at night, but make sure you can sleep in the next day as you can be groggy for a bit.  *If you need a refill on your cardiac medications before your next appointment, please call your pharmacy*   Lab Work: None ordered today   Testing/Procedures: None ordered today   Follow-Up: At [facility], you and your health needs are our priority.  As part of our continuing mission to provide you with exceptional heart care, we have created designated Provider Care Teams.  These Care Teams include your primary Cardiologist (physician) and Advanced Practice Providers (APPs -  Physician Assistants and Nurse Practitioners) who all work together to provide you with the care you need, when you need it.  We recommend signing up for the patient portal called "MyChart".  Sign up information is provided on this After Visit Summary.  MyChart is used to connect with patients for Virtual Visits (Telemedicine).  Patients are able to view lab/test results, encounter notes, upcoming appointments, etc.  Non-urgent messages can be sent to your provider as well.   To learn more about what you can do with MyChart, go to [email or web address].    Your next appointment:   6 month(s)  The format for your next appointment:   In Person  Provider:   Buford Dresser, MD      Signed, Buford Dresser, MD PhD 04/10/2021  [place]

## 2021-04-12 ENCOUNTER — Telehealth (HOSPITAL_COMMUNITY): Payer: Self-pay | Admitting: Emergency Medicine

## 2021-04-12 NOTE — Telephone Encounter (Signed)
Reaching out to patient to offer assistance regarding upcoming cardiac imaging study; pt verbalizes understanding of appt date/time, parking situation and where to check in, and verified current allergies; name and call back number provided for further questions should they arise Marchia Bond RN Navigator Cardiac Imaging [facility] [phone] office [phone] cell  Pt informed that he will need to remove freestyle libre sensor prior to CMR on Tuesday. Pt upset as they are expensive but wishes to proceed with testing as schedule so to not delay care/follow up.  Pt denies iv issues Denies claustro  Pt apprecitaed the phone call.

## 2021-04-16 ENCOUNTER — Ambulatory Visit (HOSPITAL_COMMUNITY)
Admission: RE | Admit: 2021-04-16 | Discharge: 2021-04-16 | Disposition: A | Discharge status: Home / Self Care | Payer: Medicare HMO | Source: Ambulatory Visit | Attending: Cardiology | Admitting: Cardiology

## 2021-04-16 ENCOUNTER — Other Ambulatory Visit: Payer: Self-pay

## 2021-04-16 DIAGNOSIS — I428 Other cardiomyopathies: Secondary | ICD-10-CM | POA: Diagnosis not present

## 2021-04-16 MED ORDER — GADOBUTROL 1 MMOL/ML IV SOLN
10.0000 mL | Freq: Once | INTRAVENOUS | Status: AC | PRN
  Administered 2021-04-16: 10 mL via INTRAVENOUS

## 2021-04-22 ENCOUNTER — Telehealth (HOSPITAL_COMMUNITY): Payer: Self-pay | Admitting: Pharmacy Technician

## 2021-04-22 NOTE — Telephone Encounter (Signed)
Advanced Heart Failure Patient Advocate Encounter  Spoke to patient regarding re-enrollment of Jardiance and Delene Loll assistance with [employer] and [employer]. Patient has started the [facility] app but has not received the [employer] application yet. Will start [employer] application, patient will bring POI and sign when he comes to appointment tomorrow.   Will follow up.

## 2021-04-23 ENCOUNTER — Ambulatory Visit (HOSPITAL_BASED_OUTPATIENT_CLINIC_OR_DEPARTMENT_OTHER)
Admission: RE | Admit: 2021-04-23 | Discharge: 2021-04-23 | Disposition: A | Discharge status: Home / Self Care | Payer: Medicare HMO | Source: Ambulatory Visit | Attending: Cardiology | Admitting: Cardiology

## 2021-04-23 ENCOUNTER — Encounter (HOSPITAL_COMMUNITY): Payer: Self-pay | Admitting: Cardiology

## 2021-04-23 ENCOUNTER — Encounter (HOSPITAL_COMMUNITY): Payer: Medicare HMO | Admitting: Cardiology

## 2021-04-23 ENCOUNTER — Ambulatory Visit (HOSPITAL_COMMUNITY)
Admission: RE | Admit: 2021-04-23 | Discharge: 2021-04-23 | Disposition: A | Discharge status: Home / Self Care | Payer: Medicare HMO | Source: Ambulatory Visit | Attending: Internal Medicine | Admitting: Internal Medicine

## 2021-04-23 ENCOUNTER — Other Ambulatory Visit: Payer: Self-pay

## 2021-04-23 ENCOUNTER — Ambulatory Visit (INDEPENDENT_AMBULATORY_CARE_PROVIDER_SITE_OTHER): Payer: Medicare HMO | Admitting: *Deleted

## 2021-04-23 VITALS — BP 136/80 | HR 62 | Wt 260.6 lb

## 2021-04-23 DIAGNOSIS — I5022 Chronic systolic (congestive) heart failure: Secondary | ICD-10-CM

## 2021-04-23 DIAGNOSIS — I513 Intracardiac thrombosis, not elsewhere classified: Secondary | ICD-10-CM

## 2021-04-23 DIAGNOSIS — I080 Rheumatic disorders of both mitral and aortic valves: Secondary | ICD-10-CM | POA: Diagnosis not present

## 2021-04-23 DIAGNOSIS — Z7901 Long term (current) use of anticoagulants: Secondary | ICD-10-CM | POA: Insufficient documentation

## 2021-04-23 DIAGNOSIS — I480 Paroxysmal atrial fibrillation: Secondary | ICD-10-CM

## 2021-04-23 DIAGNOSIS — I130 Hypertensive heart and chronic kidney disease with heart failure and stage 1 through stage 4 chronic kidney disease, or unspecified chronic kidney disease: Secondary | ICD-10-CM | POA: Diagnosis not present

## 2021-04-23 DIAGNOSIS — N183 Chronic kidney disease, stage 3 unspecified: Secondary | ICD-10-CM | POA: Insufficient documentation

## 2021-04-23 DIAGNOSIS — Z7984 Long term (current) use of oral hypoglycemic drugs: Secondary | ICD-10-CM | POA: Insufficient documentation

## 2021-04-23 DIAGNOSIS — E039 Hypothyroidism, unspecified: Secondary | ICD-10-CM | POA: Insufficient documentation

## 2021-04-23 DIAGNOSIS — Z79899 Other long term (current) drug therapy: Secondary | ICD-10-CM | POA: Insufficient documentation

## 2021-04-23 DIAGNOSIS — G4733 Obstructive sleep apnea (adult) (pediatric): Secondary | ICD-10-CM | POA: Diagnosis not present

## 2021-04-23 DIAGNOSIS — E1122 Type 2 diabetes mellitus with diabetic chronic kidney disease: Secondary | ICD-10-CM | POA: Diagnosis not present

## 2021-04-23 LAB — COMPREHENSIVE METABOLIC PANEL
ALT: 24 U/L (ref 0–44)
AST: 33 U/L (ref 15–41)
Albumin: 4.3 g/dL (ref 3.5–5.0)
Alkaline Phosphatase: 77 U/L (ref 38–126)
Anion gap: 12 (ref 5–15)
BUN: 15 mg/dL (ref 8–23)
CO2: 28 mmol/L (ref 22–32)
Calcium: 9.7 mg/dL (ref 8.9–10.3)
Chloride: 94 mmol/L — ABNORMAL LOW (ref 98–111)
Creatinine, Ser: 1.27 mg/dL — ABNORMAL HIGH (ref 0.61–1.24)
GFR, Estimated: >60 mL/min (ref >60)
Glucose, Bld: 91 mg/dL (ref 70–99)
Potassium: 4.6 mmol/L (ref 3.5–5.1)
Sodium: 134 mmol/L — ABNORMAL LOW (ref 135–145)
Total Bilirubin: 1.4 mg/dL — ABNORMAL HIGH (ref 0.3–1.2)
Total Protein: 8 g/dL (ref 6.5–8.1)

## 2021-04-23 LAB — ECHOCARDIOGRAM COMPLETE
Area-P 1/2: 2.31 cm2
S' Lateral: 4.6 cm
Single Plane A4C EF: 49.5 %

## 2021-04-23 LAB — TSH: TSH: 3.28 u[IU]/mL (ref 0.350–4.500)

## 2021-04-23 LAB — PROTIME-INR
INR: 2.5 — ABNORMAL HIGH (ref 0.8–1.2)
Prothrombin Time: 26.6 seconds — ABNORMAL HIGH (ref 11.4–15.2)

## 2021-04-23 MED ORDER — CARVEDILOL 6.25 MG PO TABS: 6.2500 mg | ORAL_TABLET | Freq: Two times a day (BID) | ORAL | 3 refills | Status: DC

## 2021-04-23 MED ORDER — PERFLUTREN LIPID MICROSPHERE
1.0000 mL | INTRAVENOUS | Status: DC | PRN
  Administered 2021-04-23: 2 mL via INTRAVENOUS
  Filled 2021-04-23: qty 10

## 2021-04-23 NOTE — Patient Instructions (Signed)
Description   Spoke with pt and advised to continue taking warfarin 1 tablet daily except for 1.5 tablets on Sundays, Tuesdays and Thursdays. Recheck INR in 4 weeks. Coumadin Clinic [phone].

## 2021-04-23 NOTE — Patient Instructions (Addendum)
EKG done today.  Labs done today. We will contact you only if your labs are abnormal.  INCREASE Carvedilol to 6.25mg  (1 tablet) by mouth 2 times daily.   No medication changes were made. Please continue all current medications as prescribed.  You have been referred to Electrophysiology at [facility]. They will contact you to schedule an appointment.   Your provider has requested that you have a PYP Scan done. This has to be approved through your insurance company prior to scheduling once approved we will contact you to schedule an appointment.   Genetic test has been done, this has to be sent to [place] to be processed and can take 1-2 weeks to get results back.  We will let you know the results.  Your physician recommends that you schedule a follow-up appointment in: 3 weeks with our Clinic Pharmacist here in our office and in 2 months with Dr. Aundra Dubin.  If you have any questions or concerns before your next appointment please send [place] a message through [place] or call our office at [phone].    TO LEAVE A MESSAGE FOR THE NURSE SELECT OPTION 2, PLEASE LEAVE A MESSAGE INCLUDING: YOUR NAME DATE OF BIRTH CALL BACK NUMBER REASON FOR CALL**this is important as we prioritize the call backs  YOU WILL RECEIVE A CALL BACK THE SAME DAY AS LONG AS YOU CALL BEFORE 4:00 PM   Do the following things EVERYDAY: Weigh yourself in the morning before breakfast. Write it down and keep it in a log. Take your medicines as prescribed Eat low salt foods--Limit salt (sodium) to 2000 mg per day.  Stay as active as you can everyday Limit all fluids for the day to less than 2 liters   At the [facility], you and your health needs are our priority. As part of our continuing mission to provide you with exceptional heart care, we have created designated Provider Care Teams. These Care Teams include your primary Cardiologist (physician) and Advanced Practice Providers (APPs- Physician  Assistants and Nurse Practitioners) who all work together to provide you with the care you need, when you need it.   You may see any of the following providers on your designated Care Team at your next follow up: Dr Glori Bickers Dr Haynes Kerns, NP Lyda Jester, [place] Audry Riles, PharmD   Please be sure to bring in all your medications bottles to every appointment.

## 2021-04-23 NOTE — Progress Notes (Signed)
  Echocardiogram 2D Echocardiogram has been performed.  Matilde Bash 04/23/2021, 1:35 PM

## 2021-04-24 LAB — KAPPA/LAMBDA LIGHT CHAINS
Kappa free light chain: 40.4 mg/L — ABNORMAL HIGH (ref 3.3–19.4)
Kappa, lambda light chain ratio: 1.45 (ref 0.26–1.65)
Lambda free light chains: 27.9 mg/L — ABNORMAL HIGH (ref 5.7–26.3)

## 2021-04-24 NOTE — Progress Notes (Signed)
PCP:  Dr Sharlet Salina  Primary Cardiologist: Dr Harrell Gave HF MD: Dr Aundra Dubin  EP: Dr Rayann Heman   HPI: 65 y.o. male w/ h/o cardiomyopathy, presumed to be tachy-mediated from atrial fibrillation/atrial flutter.  Paroxsymal Afib/flutter dates back to ~2012. Had Aflutter ablation in 2012. He had several echos between 2012-2018 showing normal LVEF. Found to have reduced EF on echo in 04/2018, down to 30-35%. Was in afib at the time. 11/2018 was referred for Afib ablation but procedure was aborted after TEE showed LA thrombus, despite being on Xarelto. Anticoagulation changed to Pradaxa. He ultimately spontaneously converted back to NSR. Repeat echo 10/20 showed EF 35-40%.  Plans were made for [facility] 10/20 however had asymptomatic positive COVID test, then renal failure. Ischemic eval changed to [employer] which showed no ischemia in 10/20. Echo repeated 3/21, EF 40-45%, RV normal.    Seen in cardiology clinic, back in atrial fibrillation with RVR.  Presented for outpatient DCCV 08/03/20. TEE showed LA thrombus. DCCV canceled. EF lower on TEE ~5-10% with RV severely reduced. Admitted and started on IV Lasix and PICC line was placed. Milrinone started for low output, initial co-ox 35%. Pradaxa stopped and he was placed on coumadin w/ heparin bridge. Placed on IV amio for afib. He had repeat TEE on 08/07/20 that showed no LAA thrombus so DCCV was done successfully. Transitioned to PO amio 200 mg bid. Remained in NSR. Diuresed well w/ IV Lasix and able to wean of milrinone. Discharge weight 264 pounds.   He went back into atrial fibrillation in 3/22, had another DCCV back to NSR.  He had a TEE in 5/22 showing EF 30-35%, then had atrial fibrillation ablation.    Echo in 7/22 showed showed EF 40-45% with normal RV and moderate MR.   Patient has polycythemia and was noted to be a compound heterozygote for hereditary hemochromatosis.  Cardiac MRI was done in 11/22 showing EF 27%, moderate asymmetric septal  hypertrophy, diffuse hypokinesis worse in the septal wall, RV EF 30%, noncoronary LGE with diffuse mid-wall involvement especially in the septum, T2* sequences not suggestive of cardiac hemochromatosis, ECV 50% in the septum.  Echo in 11/22 showed EF 30-35%, severe asymmetric septal hypertrophy, no LVOT gradient or mitral valve SAM, mild RV dilation with mildly decreased RV systolic function.    Patient saw hematology, will not get therapeutic phlebotomy as long as ferritin < 200 and transferrin saturation < 75%.   He returns today for followup of CHF.  He is in NSR.  Rarely drinking ETOH (prior heavy).  Plays golf and works full time.  No significant exertional dyspnea.  No chest pain.  He has no family history of cardiomyopathy, including hypertrophic cardiomyopathy.   Labs (5/22): K 4.1, creatinine 1.49 Labs (9/22): hgb 20.3/HCT 60.1, K 4.9, creatinine 1.46, ferritin 116  ECG (personally reviewed): NSR, 1st degree AVB 234 msec, IVCD 158 msec  PMH: 1. Atrial flutter: Ablation 2012.  2. Atrial fibrillation: H/o LAA thrombus on both Xarelto and Pradaxa.  DCCV 2/22, 3/22. Atrial fibrillation ablation in 5/22.  3. Chronic systolic CHF: ?tachycardia-mediated CMP with AF/RVR.  - Cardiolite (10/20): No ischemia.  - TEE (2/22): EF 5-10% with severe RV dysfunction.  - Echo (7/22): EF 40-45%, normal RV, moderate MR.  - Cardiac MRI (11/22): EF 27%, moderate asymmetric septal hypertrophy, diffuse hypokinesis worse in the septal wall, RV EF 30%, noncoronary LGE with diffuse mid-wall involvement especially in the septum, T2* sequences not suggestive of cardiac hemochromatosis, ECV 50% in the septum.  -  Echo (11/22): EF 30-35%, severe asymmetric septal hypertrophy, no LVOT gradient or mitral valve SAM, mild RV dilation with mildly decreased RV systolic function.   4. CKD stage 3 5. OSA: Uses CPAP 6. Type 2 diabetes 7. Hereditary hemochromatosis: Compound heterozygote with Cys282Tyr and His63Asp.   ROS:  All systems negative except as listed in HPI, PMH and Problem List.  Social History   Socioeconomic History   Marital status: Single    Spouse name: Not on file   Number of children: 0   Years of education: Not on file   Highest education level: Not on file  Occupational History   Occupation: Sales  Tobacco Use   Smoking status: Never   Smokeless tobacco: Never  Vaping Use   Vaping Use: Never used  Substance and Sexual Activity   Alcohol use: Yes    Alcohol/week: 21.0 standard drinks    Types: 21 Cans of beer per week    Comment: previously quite heavy, working on cessation    Drug use: No   Sexual activity: Not on file  Other Topics Concern   Not on file  Social History Narrative   Lives in [place]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   Social Determinants of Health   Financial Resource Strain: Not on file  Food Insecurity: Not on file  Transportation Needs: Not on file  Physical Activity: Not on file  Stress: Not on file  Social Connections: Not on file  Intimate Partner Violence: Not on file    FH:  Family History  Problem Relation Age of Onset   Hypertension Mother    Kidney failure Mother        Due to sepsis   COPD Father    Colon cancer Brother        dx in 23's   Esophageal cancer Neg Hx    Rectal cancer Neg Hx    Stomach cancer Neg Hx     Past Medical History:  Diagnosis Date   Asthma    as a teenager - does not use an inhaler although pt said he has an albuteral inhaler   Atrial fibrillation ([place])    persistant 02/2009   Atrial flutter ([place])    s/p CTI ablation 08/06/10   Cataract    Chronic rhinitis    Colon polyp    Congestive heart failure ([place])    Diverticulosis    colonoscopy 04/03/2009   Hyperlipidemia    Hypertension    Morbid obesity ([place])    target weight = 219  for BMI < 30   Nonischemic cardiomyopathy ([place])    tachycardia mediated   Seasonal allergies    Sleep apnea    original 2001 - wears C-PaP   Type 2 diabetes mellitus (HCC)      Current Outpatient Medications  Medication Sig Dispense Refill   albuterol (PROVENTIL HFA;VENTOLIN HFA) 108 (90 Base) MCG/ACT inhaler Inhale 2 puffs into the lungs every 6 (six) hours as needed for wheezing or shortness of breath.     amiodarone (PACERONE) 200 MG tablet Take 1 tablet (200 mg total) by mouth daily. Needs appt 60 tablet 2   BD PEN NEEDLE NANO 2ND GEN 32G X 4 MM MISC USE 4 TIMES A DAY AS DIRECTED     BD PEN NEEDLE NANO 2ND GEN 32G X 4 MM MISC USE 4 TIMES A DAY AS DIRECTED 100 each 4   budesonide-formoterol (SYMBICORT) 80-4.5 MCG/ACT inhaler Inhale 2 puffs into the lungs as needed.  cholecalciferol (VITAMIN D3) 25 MCG (1000 UNIT) tablet Take 1,000 Units by mouth daily.     Continuous Blood Gluc Receiver (FREESTYLE LIBRE 14 DAY READER) DEVI USE AS DIRECTED 1 each 11   doxylamine, Sleep, (UNISOM) 25 MG tablet Take 50 mg by mouth at bedtime.     empagliflozin (JARDIANCE) 10 MG TABS tablet Take 1 tablet (10 mg total) by mouth daily. 30 tablet 5   furosemide (LASIX) 20 MG tablet Take 10 mg by mouth daily.     insulin aspart (NOVOLOG) 100 UNIT/ML FlexPen Inject 8-12 Units into the skin 3 (three) times daily with meals. 30 mL 3   Insulin Degludec (TRESIBA FLEXTOUCH [place]) Inject 32 Units into the skin daily.     levothyroxine (SYNTHROID) 50 MCG tablet TAKE 1 TABLET BY MOUTH EVERY DAY BEFORE BREAKFAST 90 tablet 0   mometasone (NASONEX) 50 MCG/ACT nasal spray Place 2 sprays into the nose daily as needed (allergies or rhinitis).     Multiple Vitamins-Minerals (MULTIVITAMIN WITH MINERALS) tablet Take 2 tablets by mouth at bedtime. Chewable     OZEMPIC, 0.25 OR 0.5 MG/DOSE, 2 MG/1.5ML SOPN INJECT 0.5 MG INTO THE SKIN ONCE A WEEK. 1.5 mL 5   sacubitril-valsartan (ENTRESTO) 97-103 MG Take 1 tablet by mouth 2 (two) times daily. 180 tablet 3   spironolactone (ALDACTONE) 25 MG tablet TAKE 1 TABLET BY MOUTH EVERY DAY 90 tablet 3   thiamine 100 MG tablet Take 1 tablet (100 mg total) by mouth  daily. 30 tablet 1   vitamin C (ASCORBIC ACID) 500 MG tablet Take 500 mg by mouth in the morning.     warfarin (COUMADIN) 5 MG tablet TAKE 1 TABLET TO 1 AND 1/2 TABLETS BY MOUTH DAILY AS DIRECTED BY THE COUMADIN CLINIC. 130 tablet 0   carvedilol (COREG) 6.25 MG tablet Take 1 tablet (6.25 mg total) by mouth 2 (two) times daily with a meal. 180 tablet 3   No current facility-administered medications for this encounter.    Vitals:   04/23/21 1412  BP: 136/80  Pulse: 62  SpO2: 98%  Weight: 118.2 kg (260 lb 9.6 oz)   Wt Readings from Last 3 Encounters:  04/23/21 118.2 kg (260 lb 9.6 oz)  04/10/21 117.9 kg (260 lb)  03/14/21 117.3 kg (258 lb 11.2 oz)   PHYSICAL EXAM: General: NAD Neck: No JVD, no thyromegaly or thyroid nodule.  Lungs: Clear to auscultation bilaterally with normal respiratory effort. CV: Nondisplaced PMI.  Heart regular S1/S2, no S3/S4, no murmur.  No peripheral edema.  No carotid bruit.  Normal pedal pulses.  Abdomen: Soft, nontender, no hepatosplenomegaly, no distention.  Skin: Intact without lesions or rashes.  Neurologic: Alert and oriented x 3.  Psych: Normal affect. Extremities: No clubbing or cyanosis.  HEENT: Normal.   ASSESSMENT & PLAN: 1.Chronic systolic CHF: TEE 4/26 with EF <10%, severe RV dysfunction, LA appendage thrombus. Echoes in the past have had EF ranging 30-45%. Never had cath but Cardiolite in 10/20 showed no ischemia. Initially, thought to have tachycardia-mediated cardiomyopathy as he seems to become markedly more symptomatic when in atrial fibrillation.  Alternatively, he could have another cause for cardiomyopathy and atrial fibrillation just causes him to decompensate.  Echo in 7/22 in NSR showed EF 40-45% with normal RV, moderate MR (in NSR after atrial fibrillation ablation). He was found to be a compound heterozygote for hereditary hemochromatosis (evaluation done because of polycythemia), so I was concerned that he could have a  hemochromatosis-related CMP.  However, cardiac  MRI was done in 11/22, showing EF 27%, moderate asymmetric septal hypertrophy, diffuse hypokinesis worse in the septal wall, RV EF 30%, noncoronary LGE with diffuse mid-wall involvement especially in the septum, T2* sequences not suggestive of cardiac hemochromatosis, ECV 50% in the septum.  Cardiomyopathy appeared most consistent with hypertrophic cardiomyopathy (nonobstructive), less likely amyloidosis or sarcoidosis.  NYHA class I-II symptoms, not volume overloaded on exam.    - Increase Coreg to 6.25 mg bid.  - Continue Entresto 97/103 bid. BMET today.  - Continue  jardiance 10 mg daily.  - Continue spironolactone 25 mg daily.   - I will seen [facility] gene testing for hypertrophic cardiomyopathy and TTR cardiac amyloidosis.  - PYP scan for TTR amyloidosis and will send off myeloma panel, urine immunofixation, serum free light chains.  - Refer back to EP for evaluation for CRT-D with persistently low EF and IVCD 158 msec, also extensive scarring on MRI and possible HCM.   - He has had a negative Cardiolite for ischemia and LGE is not in a coronary pattern on MRI, but consider coronary angiography to fully rule out CAD/ischemic cardiomyopathy.   2. Atrial fibrillation: Paroxysmal. With LAA clots despite Xarelto and Pradaxa use, he was started on warfarin.  Will aim for INR 2.5-3.  AF ablation in 5/22.  He is in NSR today.  - Continue amioadrone 200 mg daily, check LFTs and TSH today.  He will need regular eye exam. Stop amiodarone eventually if he stays in NSR.  3. CKD stage 3: BMET today.  4. OSA: Continue CPAP nightly.  5. Type 2 diabetes: Continue Jardiance 10 mg daily.  6. Hereditary hemochromatosis: Compound heterozygote.  T2* sequences on cardiac MRI were not suggestive of cardiac hemochromatosis.  - Therapeutic phlebotomy only if ferritin > 200 or transferrin saturation > 75%.   Followup with HF pharmacist in 3 wks for med titration, see me in 2  months  Loralie Champagne 04/24/2021

## 2021-04-25 ENCOUNTER — Other Ambulatory Visit: Payer: Self-pay | Admitting: Internal Medicine

## 2021-04-26 ENCOUNTER — Telehealth (HOSPITAL_COMMUNITY): Payer: Self-pay | Admitting: Vascular Surgery

## 2021-04-26 LAB — IMMUNOFIXATION, URINE

## 2021-04-26 NOTE — Telephone Encounter (Signed)
Left pt detailed message giving PYP scan appt, asked pt to call back to confirm and gave pt Radiology # to reschedule if pt cant make 11/14 appt

## 2021-04-29 ENCOUNTER — Encounter (HOSPITAL_COMMUNITY): Admission: RE | Admit: 2021-04-29 | Payer: Medicare HMO | Source: Ambulatory Visit

## 2021-04-29 ENCOUNTER — Encounter (HOSPITAL_COMMUNITY): Payer: Medicare HMO

## 2021-04-29 DIAGNOSIS — G4733 Obstructive sleep apnea (adult) (pediatric): Secondary | ICD-10-CM | POA: Diagnosis not present

## 2021-04-29 LAB — MULTIPLE MYELOMA PANEL, SERUM
Albumin SerPl Elph-Mcnc: 4.1 g/dL (ref 2.9–4.4)
Albumin/Glob SerPl: 1.2 (ref 0.7–1.7)
Alpha 1: 0.3 g/dL (ref 0.0–0.4)
Alpha2 Glob SerPl Elph-Mcnc: 0.8 g/dL (ref 0.4–1.0)
B-Globulin SerPl Elph-Mcnc: 1.1 g/dL (ref 0.7–1.3)
Gamma Glob SerPl Elph-Mcnc: 1.5 g/dL (ref 0.4–1.8)
Globulin, Total: 3.6 g/dL (ref 2.2–3.9)
IgA: 215 mg/dL (ref 61–437)
IgG (Immunoglobin G), Serum: 1521 mg/dL (ref 603–1613)
IgM (Immunoglobulin M), Srm: 86 mg/dL (ref 20–172)
Total Protein ELP: 7.7 g/dL (ref 6.0–8.5)

## 2021-04-30 ENCOUNTER — Other Ambulatory Visit (HOSPITAL_COMMUNITY): Payer: Self-pay | Admitting: *Deleted

## 2021-04-30 MED ORDER — ENTRESTO 97-103 MG PO TABS: 1.0000 | ORAL_TABLET | Freq: Two times a day (BID) | ORAL | 3 refills | Status: DC

## 2021-05-02 DIAGNOSIS — E1169 Type 2 diabetes mellitus with other specified complication: Secondary | ICD-10-CM | POA: Diagnosis not present

## 2021-05-03 ENCOUNTER — Encounter: Payer: Self-pay | Admitting: Internal Medicine

## 2021-05-03 ENCOUNTER — Ambulatory Visit (INDEPENDENT_AMBULATORY_CARE_PROVIDER_SITE_OTHER): Payer: Medicare HMO | Admitting: Internal Medicine

## 2021-05-03 VITALS — BP 138/72 | HR 57 | Ht 73.0 in | Wt 260.4 lb

## 2021-05-03 DIAGNOSIS — I4819 Other persistent atrial fibrillation: Secondary | ICD-10-CM

## 2021-05-03 DIAGNOSIS — G4733 Obstructive sleep apnea (adult) (pediatric): Secondary | ICD-10-CM

## 2021-05-03 DIAGNOSIS — I428 Other cardiomyopathies: Secondary | ICD-10-CM

## 2021-05-03 NOTE — Patient Instructions (Addendum)
Medication Instructions:  Stop Amiodarone Your physician recommends that you continue on your current medications as directed. Please refer to the Current Medication list given to you today. *If you need a refill on your cardiac medications before your next appointment, please call your pharmacy*  Lab Work: None. If you have labs (blood work) drawn today and your tests are completely normal, you will receive your results only by: [place] (if you have MyChart) OR A paper copy in the mail If you have any lab test that is abnormal or we need to change your treatment, we will call you to review the results.  Testing/Procedures: None.  Follow-Up: At [facility], you and your health needs are our priority.  As part of our continuing mission to provide you with exceptional heart care, we have created designated Provider Care Teams.  These Care Teams include your primary Cardiologist (physician) and Advanced Practice Providers (APPs -  Physician Assistants and Nurse Practitioners) who all work together to provide you with the care you need, when you need it.  Your physician wants you to follow-up in: 08/02/21 at 11:40 am one of the following Advanced Practice Providers on your designated Care Team:     Legrand Como "Jonni Sanger" Chalmers Cater, [place]   You will receive a reminder letter in the mail two months in advance. If you don't receive a letter, please call our office to schedule the follow-up appointment.  We recommend signing up for the patient portal called "MyChart".  Sign up information is provided on this After Visit Summary.  MyChart is used to connect with patients for Virtual Visits (Telemedicine).  Patients are able to view lab/test results, encounter notes, upcoming appointments, etc.  Non-urgent messages can be sent to your provider as well.   To learn more about what you can do with MyChart, go to [email or web address].    Any Other Special Instructions Will Be Listed Below (If  Applicable).

## 2021-05-03 NOTE — Progress Notes (Signed)
PCP: [name], MD Primary Cardiologist: Dr Harrell Gave CHF:  Aundra Dubin Primary EP: Dr Rayann Heman  [name] is a 65 y.o. male who presents today for routine electrophysiology followup.  Since last being seen in our clinic, the patient reports doing very well.  Today, he denies symptoms of palpitations, chest pain, shortness of breath,  lower extremity edema, dizziness, presyncope, or syncope.  The patient is otherwise without complaint today.   Past Medical History:  Diagnosis Date   Asthma    as a teenager - does not use an inhaler although pt said he has an albuteral inhaler   Atrial fibrillation (HCC)    persistant 02/2009   Atrial flutter ([place])    s/p CTI ablation 08/06/10   Cataract    Chronic rhinitis    Colon polyp    Congestive heart failure ([place])    Diverticulosis    colonoscopy 04/03/2009   Hyperlipidemia    Hypertension    Morbid obesity ([place])    target weight = 219  for BMI < 30   Nonischemic cardiomyopathy (HCC)    tachycardia mediated   Seasonal allergies    Sleep apnea    original 2001 - wears C-PaP   Type 2 diabetes mellitus ([place])    Past Surgical History:  Procedure Laterality Date   ATRIAL FIBRILLATION ABLATION N/A 12/14/2018   Procedure: ATRIAL FIBRILLATION ABLATION;  Surgeon: Thompson Grayer, MD;  Location: [place] CV LAB;  Service: Cardiovascular;  Laterality: N/A;   ATRIAL FIBRILLATION ABLATION N/A 10/18/2020   Procedure: ATRIAL FIBRILLATION ABLATION;  Surgeon: Thompson Grayer, MD;  Location: [place] CV LAB;  Service: Cardiovascular;  Laterality: N/A;   atrial flutter ablation  08/06/10   CARDIOVERSION N/A 07/05/2018   Procedure: CARDIOVERSION;  Surgeon: Buford Dresser, MD;  Location: [facility] ENDOSCOPY;  Service: Cardiovascular;  Laterality: N/A;   CARDIOVERSION N/A 08/08/2020   Procedure: CARDIOVERSION;  Surgeon: Larey Dresser, MD;  Location: [facility] ENDOSCOPY;  Service: Cardiovascular;  Laterality: N/A;   CARDIOVERSION N/A 08/29/2020    Procedure: CARDIOVERSION;  Surgeon: Jolaine Artist, MD;  Location: [place];  Service: Cardiovascular;  Laterality: N/A;   COLONOSCOPY     POLYPECTOMY     RIGHT HEART CATH N/A 08/06/2020   Procedure: RIGHT HEART CATH;  Surgeon: Larey Dresser, MD;  Location: [place] CV LAB;  Service: Cardiovascular;  Laterality: N/A;   TEE WITHOUT CARDIOVERSION N/A 12/14/2018   Procedure: TRANSESOPHAGEAL ECHOCARDIOGRAM (TEE);  Surgeon: Thompson Grayer, MD;  Location: [place] CV LAB;  Service: Cardiovascular;  Laterality: N/A;   TEE WITHOUT CARDIOVERSION N/A 08/03/2020   Procedure: TRANSESOPHAGEAL ECHOCARDIOGRAM (TEE);  Surgeon: Sanda Klein, MD;  Location: [place];  Service: Cardiovascular;  Laterality: N/A;   TEE WITHOUT CARDIOVERSION N/A 08/08/2020   Procedure: TRANSESOPHAGEAL ECHOCARDIOGRAM (TEE);  Surgeon: Larey Dresser, MD;  Location: [facility] ENDOSCOPY;  Service: Cardiovascular;  Laterality: N/A;   TEE WITHOUT CARDIOVERSION N/A 10/18/2020   Procedure: TRANSESOPHAGEAL ECHOCARDIOGRAM (TEE);  Surgeon: Jerline Pain, MD;  Location: [facility] ENDOSCOPY;  Service: Cardiovascular;  Laterality: N/A;    ROS- all systems are reviewed and negatives except as per HPI above  Current Outpatient Medications  Medication Sig Dispense Refill   albuterol (PROVENTIL HFA;VENTOLIN HFA) 108 (90 Base) MCG/ACT inhaler Inhale 2 puffs into the lungs every 6 (six) hours as needed for wheezing or shortness of breath.     amiodarone (PACERONE) 200 MG tablet Take 1 tablet (200 mg total) by mouth daily. Needs appt 60 tablet  2   BD PEN NEEDLE NANO 2ND GEN 32G X 4 MM MISC USE 4 TIMES A DAY AS DIRECTED     BD PEN NEEDLE NANO 2ND GEN 32G X 4 MM MISC USE 4 TIMES A DAY AS DIRECTED 100 each 4   budesonide-formoterol (SYMBICORT) 80-4.5 MCG/ACT inhaler Inhale 2 puffs into the lungs as needed.     carvedilol (COREG) 6.25 MG tablet Take 1 tablet (6.25 mg total) by mouth 2 (two) times daily with a meal. 180 tablet 3   cholecalciferol  (VITAMIN D3) 25 MCG (1000 UNIT) tablet Take 1,000 Units by mouth daily.     Continuous Blood Gluc Receiver (FREESTYLE LIBRE 14 DAY READER) DEVI USE AS DIRECTED 1 each 11   digoxin (LANOXIN) 0.125 MG tablet      doxylamine, Sleep, (UNISOM) 25 MG tablet Take 50 mg by mouth at bedtime.     empagliflozin (JARDIANCE) 10 MG TABS tablet Take 1 tablet (10 mg total) by mouth daily. 30 tablet 5   furosemide (LASIX) 20 MG tablet Take 10 mg by mouth daily.     furosemide (LASIX) 40 MG tablet      hydrALAZINE (APRESOLINE) 50 MG tablet      insulin aspart (NOVOLOG) 100 UNIT/ML FlexPen Inject 8-12 Units into the skin 3 (three) times daily with meals. 30 mL 3   Insulin Degludec (TRESIBA FLEXTOUCH [place]) Inject 32 Units into the skin daily.     isosorbide mononitrate (IMDUR) 60 MG 24 hr tablet      levothyroxine (SYNTHROID) 50 MCG tablet TAKE 1 TABLET BY MOUTH EVERY DAY BEFORE BREAKFAST 90 tablet 0   losartan (COZAAR) 25 MG tablet      mometasone (NASONEX) 50 MCG/ACT nasal spray Place 2 sprays into the nose daily as needed (allergies or rhinitis).     Multiple Vitamins-Minerals (MULTIVITAMIN WITH MINERALS) tablet Take 2 tablets by mouth at bedtime. Chewable     OZEMPIC, 0.25 OR 0.5 MG/DOSE, 2 MG/1.5ML SOPN INJECT 0.5 MG INTO THE SKIN ONCE A WEEK. 4.5 mL 0   sacubitril-valsartan (ENTRESTO) 97-103 MG Take 1 tablet by mouth 2 (two) times daily. 180 tablet 3   spironolactone (ALDACTONE) 25 MG tablet TAKE 1 TABLET BY MOUTH EVERY DAY 90 tablet 3   thiamine 100 MG tablet Take 1 tablet (100 mg total) by mouth daily. 30 tablet 1   TRESIBA FLEXTOUCH 200 UNIT/ML FlexTouch Pen      vitamin C (ASCORBIC ACID) 500 MG tablet Take 500 mg by mouth in the morning.     warfarin (COUMADIN) 5 MG tablet TAKE 1 TABLET TO 1 AND 1/2 TABLETS BY MOUTH DAILY AS DIRECTED BY THE COUMADIN CLINIC. 130 tablet 0   No current facility-administered medications for this visit.    Physical Exam: Vitals:   05/03/21 1410  BP: 138/72  Pulse: (!)  57  SpO2: 97%  Weight: 260 lb 6.4 oz (118.1 kg)  Height: 6\' 1"  (1.854 m)    GEN- The patient is well appearing, alert and oriented x 3 today.   Head- normocephalic, atraumatic Eyes-  Sclera clear, conjunctiva pink Ears- hearing intact Oropharynx- clear Lungs- Clear to ausculation bilaterally, normal work of breathing Heart- Regular rate and rhythm, no murmurs, rubs or gallops, PMI not laterally displaced GI- soft, NT, ND, + BS Extremities- no clubbing, cyanosis, or edema  Wt Readings from Last 3 Encounters:  05/03/21 260 lb 6.4 oz (118.1 kg)  04/23/21 260 lb 9.6 oz (118.2 kg)  04/10/21 260 lb (117.9 kg)  Echo 04/23/21- EF 30%, global LV hypokenesis, mildly reduced RV function, moderate LA enlargement  EKG tracing ordered today is personally reviewed and shows sinus, LAHB, QRS 158 msec, QTc 490 msec  Assessment and Plan:  Persistent atrial fibrillation Maintaining sinus rhythm post ablation On coumadin for chads2vasc score of at least 3 stop amiodarone  2. Nonischemic CM EF 30% by recent echo The patient has a nonischemic CM (EF 30%), NYHA Class I CHF, and IVCD with QRS > 150 msec.  At this time, he meets MADIT II/ SCD-HeFT criteria for BiVICD implantation for primary prevention of sudden death.  I have had a thorough discussion with the patient reviewing options.  The patient has had opportunities to ask questions and have them answered. The patient and I have decided together through a shared decision making process to continue to consider BiV ICD implant.  He wishes to think about this further and discuss on future visits.  He will contact my office if he wishes to proceed in the interim.  3. OSA Compliance with CPAP advised  4. Obesity Body mass index is 34.36 kg/m. Lifestyle modification advised  Risks, benefits and potential toxicities for medications prescribed and/or refilled reviewed with patient today.   Return to see EP APP In 3 months If he decides to proceed  with CRT-D this can be arranged with Dr Lovena Le.  Thompson Grayer MD, [facility] 05/03/2021 2:16 PM

## 2021-05-07 NOTE — Telephone Encounter (Addendum)
Sent in [employer] and Administrator, sports via fax 72/27.  Will follow up.

## 2021-05-08 ENCOUNTER — Other Ambulatory Visit: Payer: Self-pay

## 2021-05-08 ENCOUNTER — Encounter (HOSPITAL_COMMUNITY)
Admission: RE | Admit: 2021-05-08 | Discharge: 2021-05-08 | Disposition: A | Discharge status: Home / Self Care | Payer: Medicare HMO | Source: Ambulatory Visit | Attending: Cardiology | Admitting: Cardiology

## 2021-05-08 DIAGNOSIS — I5022 Chronic systolic (congestive) heart failure: Secondary | ICD-10-CM | POA: Insufficient documentation

## 2021-05-08 DIAGNOSIS — E854 Organ-limited amyloidosis: Secondary | ICD-10-CM | POA: Diagnosis not present

## 2021-05-08 DIAGNOSIS — I509 Heart failure, unspecified: Secondary | ICD-10-CM | POA: Diagnosis not present

## 2021-05-08 MED ORDER — TECHNETIUM TC 99M PYROPHOSPHATE
21.9000 | Freq: Once | INTRAVENOUS | Status: AC | PRN
  Administered 2021-05-08: 21.9 via INTRAVENOUS
  Filled 2021-05-08: qty 22

## 2021-05-14 ENCOUNTER — Telehealth: Payer: Self-pay | Admitting: Hematology

## 2021-05-14 NOTE — Progress Notes (Signed)
PCP:  Dr Sharlet Salina Primary Cardiologist: Dr Harrell Gave HF MD: Dr Aundra Dubin EP: Dr Rayann Heman  HPI:  65 y.o. male w/ h/o cardiomyopathy, presumed to be tachy-mediated from atrial fibrillation/atrial flutter.  Paroxsymal Afib/flutter dates back to ~2012. Had Aflutter ablation in 2012. He had several echos between 2012-2018 showing normal LVEF. Found to have reduced EF on echo in 04/2018, down to 30-35%. Was in afib at the time. 11/2018 was referred for Afib ablation but procedure was aborted after TEE showed LA thrombus, despite being on Xarelto. Anticoagulation changed to Pradaxa. He ultimately spontaneously converted back to NSR. Repeat echo 03/2019 showed EF 35-40%.  Plans were made for [facility] 03/2019 however had asymptomatic positive COVID test, then renal failure. Ischemic eval changed to [employer] which showed no ischemia in 03/2019. Echo repeated 08/2019, EF 40-45%, RV normal.    Seen in cardiology clinic, back in atrial fibrillation with RVR.  Presented for outpatient DCCV 08/03/20. TEE showed LA thrombus. DCCV canceled. EF lower on TEE ~5-10% with RV severely reduced. Admitted and started on IV furosemide and PICC line was placed. Milrinone started for low output, initial co-ox 35%. Pradaxa stopped and he was placed on warfarin with heparin bridge. Placed on IV amiodarone for afib. He had repeat TEE on 08/07/20 that showed no LAA thrombus so DCCV was done successfully. Transitioned to PO amiodarone 200 mg BID. Remained in NSR. Diuresed well with IV furosemide and able to wean off milrinone. Discharge weight 264 pounds.    He went back into atrial fibrillation in 08/2020, had another DCCV back to NSR.  He had a TEE in 10/2020 showing EF 30-35%, then had atrial fibrillation ablation.     Echo 01/09/21 showed EF 40-45% with normal RV and moderate MR.   Patient has polycythemia and was noted to be a compound heterozygote for hereditary hemochromatosis.  Cardiac MRI was done in 04/2021 showing EF 27%,  moderate asymmetric septal hypertrophy, diffuse hypokinesis worse in the septal wall, RV EF 30%, noncoronary LGE with diffuse mid-wall involvement especially in the septum, T2* sequences not suggestive of cardiac hemochromatosis, ECV 50% in the septum.  Echo in 04/2021 showed EF 30-35%, severe asymmetric septal hypertrophy, no LVOT gradient or mitral valve SAM, mild RV dilation with mildly decreased RV systolic function.     Patient saw hematology, will not get therapeutic phlebotomy as long as ferritin < 200 and transferrin saturation < 75%.    He recently returned to HF Clinic for follow up 04/23/21 with Dr. Aundra Dubin. He was in NSR.  Rarely drinking ETOH (prior heavy).  He reported that he plays golf and works full time.  No significant exertional dyspnea.  No chest pain.  He has no family history of cardiomyopathy, including hypertrophic cardiomyopathy.   Today he returns to HF clinic for pharmacist medication titration. At last visit with MD, carvedilol was increased to 6.25 mg BID. He had a PYP scan that was not suggestive of cardiac amyloidosis. Amiodarone was discontinued on 05/03/21 by Dr. Rayann Heman. Overall he is feeling well today. Says it is the best he has felt in years. No dizziness or lightheadedness. No CP or palpitations. No SOB/DOE. Weight at home has been stable, ~252 lbs. No LEE, PND or orthopnea. Taking all medications as prescribed and tolerating all medications.    HF Medications: Carvedilol 6.25 mg BID Entresto 97/103 mg BID Spironolactone 25 mg daily Jardiance 10 mg daily Furosemide 10 mg BID  Has the patient been experiencing any side effects to the medications prescribed?  no  Does the patient have any problems obtaining medications due to transportation or finances?   [employer] insurance. Has [employer] PAP for [employer] and [employer] PAP for [employer].   Understanding of regimen: good Understanding of indications: good Potential of compliance: good Patient understands to avoid  NSAIDs. Patient understands to avoid decongestants.    Pertinent Lab Values: 06/23/20: Serum creatinine 1.27, BUN 15, Potassium 4.6, Sodium 134  Vital Signs: Weight: 259.4 lbs (last clinic weight: 261.4 lbs) Blood pressure: 116/70 Heart rate: 66   Assessment/Plan: 1.Chronic systolic CHF: TEE 3/57 with EF <10%, severe RV dysfunction, LA appendage thrombus. Echoes in the past have had EF ranging 30-45%. Never had cath but Cardiolite in 03/2019 showed no ischemia. Initially, thought to have tachycardia-mediated cardiomyopathy as he seems to become markedly more symptomatic when in atrial fibrillation.  Alternatively, he could have another cause for cardiomyopathy and atrial fibrillation just causes him to decompensate.  Echo in 12/2020 in NSR showed EF 40-45% with normal RV, moderate MR (in NSR after atrial fibrillation ablation). He was found to be a compound heterozygote for hereditary hemochromatosis (evaluation done because of polycythemia), so there was concern that he could have a hemochromatosis-related CMP.  However, cardiac MRI was done in 04/2021, showing EF 27%, moderate asymmetric septal hypertrophy, diffuse hypokinesis worse in the septal wall, RV EF 30%, noncoronary LGE with diffuse mid-wall involvement especially in the septum, T2* sequences not suggestive of cardiac hemochromatosis, ECV 50% in the septum.  Cardiomyopathy appeared most consistent with hypertrophic cardiomyopathy (nonobstructive), less likely amyloidosis or sarcoidosis. PYP 04/2021 not suggestive of cardiac amyloidosis.   - NYHA class I-II symptoms, not volume overloaded on exam.   - Continue furosemide 10 mg BID - Increase carvedilol to 12.5 mg BID. - Continue Entresto 97/103 mg BID.   - Continue spironolactone 25 mg daily.  - Continue Jardiance 10 mg daily. - Digoxin discontinued 01/09/21 due to improvement in EF.    - Previously referred back to EP for evaluation for CRT-D with persistently low EF and IVCD 158 msec,  also extensive scarring on MRI and possible HCM.   - He has had a negative Cardiolite for ischemia and LGE is not in a coronary pattern on MRI, but consider coronary angiography to fully rule out CAD/ischemic cardiomyopathy.     2. Atrial fibrillation: Paroxysmal. With LAA clots despite Xarelto and Pradaxa use, he was started on warfarin.  INR goal 2.5-3.  AF ablation in 10/2020.   - Amiodarone discontinued 05/03/21 by Dr. Rayann Heman. 3. CKD stage 3: Scr 1.27 on most recent labs 04/23/21 4. OSA: Continue CPAP nightly.  5. Type 2 diabetes: Continue Jardiance 10 mg daily.  6. Hereditary hemochromatosis: Compound heterozygote.  T2* sequences on cardiac MRI were not suggestive of cardiac hemochromatosis.  - Therapeutic phlebotomy only if ferritin > 200 or transferrin saturation > 75%.   Follow up in 1 month with Dr. Rush Farmer, PharmD, BCPS, [facility], [place] [facility] Pharmacist [phone]

## 2021-05-14 NOTE — Telephone Encounter (Signed)
Sch per 10/20ninbasket,pt aware

## 2021-05-16 ENCOUNTER — Ambulatory Visit (INDEPENDENT_AMBULATORY_CARE_PROVIDER_SITE_OTHER): Payer: Medicare HMO | Admitting: Internal Medicine

## 2021-05-16 ENCOUNTER — Other Ambulatory Visit: Payer: Self-pay

## 2021-05-16 ENCOUNTER — Encounter: Payer: Self-pay | Admitting: Internal Medicine

## 2021-05-16 VITALS — BP 128/78 | HR 68 | Ht 73.0 in | Wt 261.4 lb

## 2021-05-16 DIAGNOSIS — E785 Hyperlipidemia, unspecified: Secondary | ICD-10-CM | POA: Diagnosis not present

## 2021-05-16 DIAGNOSIS — E1169 Type 2 diabetes mellitus with other specified complication: Secondary | ICD-10-CM

## 2021-05-16 DIAGNOSIS — E669 Obesity, unspecified: Secondary | ICD-10-CM | POA: Diagnosis not present

## 2021-05-16 DIAGNOSIS — Z23 Encounter for immunization: Secondary | ICD-10-CM

## 2021-05-16 DIAGNOSIS — E039 Hypothyroidism, unspecified: Secondary | ICD-10-CM

## 2021-05-16 LAB — POCT GLYCOSYLATED HEMOGLOBIN (HGB A1C): Hemoglobin A1C: 6 % — AB (ref 4.0–5.6)

## 2021-05-16 NOTE — Progress Notes (Signed)
Patient ID: [name], male   DOB: 17-Nov-1955, 65 y.o.   MRN: [id number]   This visit occurred during the SARS-CoV-2 public health emergency.  Safety protocols were in place, including screening questions prior to the visit, additional usage of staff PPE, and extensive cleaning of exam room while observing appropriate contact time as indicated for disinfecting solutions.   HPI: [name] is a 65 y.o.-year-old male, initially referred by his PCP, Dr. Sharlet Salina, returning for follow-up for DM2, dx in 2017, insulin-dependent, uncontrolled, with complications (CHF, Afib/AFlutter, CKD, h/o calf diabetic ulcer).  Last visit 6 mo ago.  Interim history: He has a history of A. fib, and before last visit also was found to have a thrombus in the left atrial appendage.  He had cardioversion in 08/2020.  He also has chronic systolic CHF.  No shortness of breath or chest pain.  He may have a defibrillator placed next year. He denies blurred vision, nausea.  He has nocturia, which is unchanged  Reviewed HbA1c levels Lab Results  Component Value Date   HGBA1C 6.0 (A) 11/08/2020   HGBA1C 5.3 08/04/2020   HGBA1C 5.7 (A) 06/29/2020   HGBA1C 5.3 02/24/2020   HGBA1C 14.2 (H) 09/04/2019   HGBA1C 15.0 (H) 09/03/2019   HGBA1C 6.9 (H) 03/08/2019   HGBA1C 6.7 (H) 01/31/2019   HGBA1C 6.1 11/26/2017   HGBA1C 6.2 09/09/2016   In the past, he was on: - Levemir 40 units 2x a day - Novolog 5 units + SSI 3x a day, before meals: BG 70-120: 0 Units, BG 121-150:3 units, BG 151-200:4 Units, BG 201-250:7 units BG 251-300: 11 Units, 301-350 : 15 units. If BG >350: Call your primary care doctor He was on Glipizide since 2017.  Now on: - Ozempic 0.5 mg weekly in a.m.  - Tresiba U200 66 >> 50 >> 40 >> 32 units daily - Novolog 8-12 units based on the size of the meal (8-12-8 units) >> 8-10 (12) before meals  He checks his sugars more than 4 times a day with his freestyle libre CGM - from  [facility]:   Prev.:   Previously:   Lowest sugar was 71 >> 58 (today - delayed lunch) >> 78 >> 76; he has hypoglycemia awareness in the 70s. Highest sugar was 186, then 330 >> 230 x1 >> 190 >> 199. He wasadmitted in 08/2019 with hyperosmolar hyperglycemic nonketotic state, and was found to have a glucose of 1186, along with hyponatremia and AKI.  An HbA1c level was very high.  Glucometer: [facility]   Pt's meals are: - Breakfast: fruit >>  >> -  almond milk with bran flakes, berries, nuts - Lunch: salad - Dinner: salad, chili, hamburger patty, grilled chicken -moved dinner earlier - Snacks:- He saw nutrition in 2016.  -+ CKD, last BUN/creatinine:  Lab Results  Component Value Date   BUN 15 04/23/2021   BUN 19 03/14/2021   CREATININE 1.27 (H) 04/23/2021   CREATININE 1.46 (H) 03/14/2021  On Cozaar 50.  -+ HL; last set of lipids: Lab Results  Component Value Date   CHOL 143 05/16/2020   HDL 53.20 05/16/2020   LDLCALC 69 05/16/2020   LDLDIRECT 81.0 08/31/2015   TRIG 107.0 05/16/2020   CHOLHDL 3 05/16/2020  On Crestor 10.  - last eye exam was in 10/2020: No DR reportedly.  -No numbness and tingling in his feet.  He has a history of right calf ulcer- he required skin graft - 2020  Pt has no FH  of DM.  Uncontrolled hypothyroidism:    Reviewed his TFTs: Lab Results  Component Value Date   TSH 3.280 04/23/2021   TSH 3.401 01/09/2021   TSH 3.52 06/29/2020   TSH 4.61 (H) 05/16/2020   TSH 33.362 (H) 09/03/2019   TSH 13.910 (H) 02/02/2019   TSH 25.834 (H) 01/31/2019   TSH 4.800 (H) 05/03/2018   TSH 3.39 11/26/2017   TSH 3.510 03/18/2017   He takes levothyroxine 50 mcg daily: - in am - fasting - now drinks black coffee - at least 30 min from b'fast - no calcium - no iron - + multivitamins later in the day - no PPIs - not on Biotin  He has a history of HTN, OSA.  Also, hereditary hemochromatosis, polycythemia.  In the past he was drinking 4-5 beers every day.   Previously was drinking hard liquor.  He stopped drinking alcohol in 2021.  ROS: + See HPI + weight loss + joint aches  I reviewed pt's medications, allergies, PMH, social hx, family hx, and changes were documented in the history of present illness. Otherwise, unchanged from my initial visit note.  Past Medical History:  Diagnosis Date   Asthma    as a teenager - does not use an inhaler although pt said he has an albuteral inhaler   Atrial fibrillation (HCC)    persistant 02/2009   Atrial flutter ([place])    s/p CTI ablation 08/06/10   Cataract    Chronic rhinitis    Colon polyp    Congestive heart failure ([place])    Diverticulosis    colonoscopy 04/03/2009   Hyperlipidemia    Hypertension    Morbid obesity ([place])    target weight = 219  for BMI < 30   Nonischemic cardiomyopathy (HCC)    tachycardia mediated   Seasonal allergies    Sleep apnea    original 2001 - wears C-PaP   Type 2 diabetes mellitus ([place])    Past Surgical History:  Procedure Laterality Date   ATRIAL FIBRILLATION ABLATION N/A 12/14/2018   Procedure: ATRIAL FIBRILLATION ABLATION;  Surgeon: Thompson Grayer, MD;  Location: [place] CV LAB;  Service: Cardiovascular;  Laterality: N/A;   ATRIAL FIBRILLATION ABLATION N/A 10/18/2020   Procedure: ATRIAL FIBRILLATION ABLATION;  Surgeon: Thompson Grayer, MD;  Location: [place] CV LAB;  Service: Cardiovascular;  Laterality: N/A;   atrial flutter ablation  08/06/10   CARDIOVERSION N/A 07/05/2018   Procedure: CARDIOVERSION;  Surgeon: Buford Dresser, MD;  Location: [facility] ENDOSCOPY;  Service: Cardiovascular;  Laterality: N/A;   CARDIOVERSION N/A 08/08/2020   Procedure: CARDIOVERSION;  Surgeon: Larey Dresser, MD;  Location: [facility] ENDOSCOPY;  Service: Cardiovascular;  Laterality: N/A;   CARDIOVERSION N/A 08/29/2020   Procedure: CARDIOVERSION;  Surgeon: Jolaine Artist, MD;  Location: [place];  Service: Cardiovascular;  Laterality: N/A;   COLONOSCOPY     POLYPECTOMY      RIGHT HEART CATH N/A 08/06/2020   Procedure: RIGHT HEART CATH;  Surgeon: Larey Dresser, MD;  Location: [place] CV LAB;  Service: Cardiovascular;  Laterality: N/A;   TEE WITHOUT CARDIOVERSION N/A 12/14/2018   Procedure: TRANSESOPHAGEAL ECHOCARDIOGRAM (TEE);  Surgeon: Thompson Grayer, MD;  Location: [place] CV LAB;  Service: Cardiovascular;  Laterality: N/A;   TEE WITHOUT CARDIOVERSION N/A 08/03/2020   Procedure: TRANSESOPHAGEAL ECHOCARDIOGRAM (TEE);  Surgeon: Sanda Klein, MD;  Location: [place];  Service: Cardiovascular;  Laterality: N/A;   TEE WITHOUT CARDIOVERSION N/A 08/08/2020   Procedure: TRANSESOPHAGEAL ECHOCARDIOGRAM (TEE);  Surgeon:  Larey Dresser, MD;  Location: [facility] ENDOSCOPY;  Service: Cardiovascular;  Laterality: N/A;   TEE WITHOUT CARDIOVERSION N/A 10/18/2020   Procedure: TRANSESOPHAGEAL ECHOCARDIOGRAM (TEE);  Surgeon: Jerline Pain, MD;  Location: [facility] END[facility]COPY;  Service: Cardiovascular;  Laterality: N/A;   Social History   Socioeconomic History   Marital status: Single    Spouse name: Not on file   Number of children: 0   Years of education: Not on file   Highest education level: Not on file  Occupational History   Occupation: Sales  Tobacco Use   Smoking status: Never   Smokeless tobacco: Never  Vaping Use   Vaping Use: Never used  Substance and Sexual Activity   Alcohol use: Yes    Alcohol/week: 21.0 standard drinks    Types: 21 Cans of beer per week    Comment: previously quite heavy, working on cessation    Drug use: No   Sexual activity: Not on file  Other Topics Concern   Not on file  Social History Narrative   Lives in [place]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   Social Determinants of Health   Financial Resource Strain: Not on file  Food Insecurity: Not on file  Transportation Needs: Not on file  Physical Activity: Not on file  Stress: Not on file  Social Connections: Not on file  Intimate Partner Violence: Not on file   Current  Outpatient Medications on File Prior to Visit  Medication Sig Dispense Refill   albuterol (PROVENTIL HFA;VENTOLIN HFA) 108 (90 Base) MCG/ACT inhaler Inhale 2 puffs into the lungs every 6 (six) hours as needed for wheezing or shortness of breath.     BD PEN NEEDLE NANO 2ND GEN 32G X 4 MM MISC USE 4 TIMES A DAY AS DIRECTED     BD PEN NEEDLE NANO 2ND GEN 32G X 4 MM MISC USE 4 TIMES A DAY AS DIRECTED 100 each 4   budesonide-formoterol (SYMBICORT) 80-4.5 MCG/ACT inhaler Inhale 2 puffs into the lungs as needed.     carvedilol (COREG) 6.25 MG tablet Take 1 tablet (6.25 mg total) by mouth 2 (two) times daily with a meal. 180 tablet 3   cholecalciferol (VITAMIN D3) 25 MCG (1000 UNIT) tablet Take 1,000 Units by mouth daily.     Continuous Blood Gluc Receiver (FREESTYLE LIBRE 14 DAY READER) DEVI USE AS DIRECTED 1 each 11   digoxin (LANOXIN) 0.125 MG tablet      doxylamine, Sleep, (UNISOM) 25 MG tablet Take 50 mg by mouth at bedtime.     empagliflozin (JARDIANCE) 10 MG TABS tablet Take 1 tablet (10 mg total) by mouth daily. 30 tablet 5   furosemide (LASIX) 20 MG tablet Take 10 mg by mouth daily.     furosemide (LASIX) 40 MG tablet      hydrALAZINE (APRESOLINE) 50 MG tablet      insulin aspart (NOVOLOG) 100 UNIT/ML FlexPen Inject 8-12 Units into the skin 3 (three) times daily with meals. 30 mL 3   Insulin Degludec (TRESIBA FLEXTOUCH [place]) Inject 32 Units into the skin daily.     isosorbide mononitrate (IMDUR) 60 MG 24 hr tablet      levothyroxine (SYNTHROID) 50 MCG tablet TAKE 1 TABLET BY MOUTH EVERY DAY BEFORE BREAKFAST 90 tablet 0   losartan (COZAAR) 25 MG tablet      mometasone (NASONEX) 50 MCG/ACT nasal spray Place 2 sprays into the nose daily as needed (allergies or rhinitis).     Multiple Vitamins-Minerals (MULTIVITAMIN WITH MINERALS)  tablet Take 2 tablets by mouth at bedtime. Chewable     OZEMPIC, 0.25 OR 0.5 MG/DOSE, 2 MG/1.5ML SOPN INJECT 0.5 MG INTO THE SKIN ONCE A WEEK. 4.5 mL 0    sacubitril-valsartan (ENTRESTO) 97-103 MG Take 1 tablet by mouth 2 (two) times daily. 180 tablet 3   spironolactone (ALDACTONE) 25 MG tablet TAKE 1 TABLET BY MOUTH EVERY DAY 90 tablet 3   thiamine 100 MG tablet Take 1 tablet (100 mg total) by mouth daily. 30 tablet 1   TRESIBA FLEXTOUCH 200 UNIT/ML FlexTouch Pen      vitamin C (ASCORBIC ACID) 500 MG tablet Take 500 mg by mouth in the morning.     warfarin (COUMADIN) 5 MG tablet TAKE 1 TABLET TO 1 AND 1/2 TABLETS BY MOUTH DAILY AS DIRECTED BY THE COUMADIN CLINIC. 130 tablet 0   No current facility-administered medications on file prior to visit.   No Known Allergies Family History  Problem Relation Age of Onset   Hypertension Mother    Kidney failure Mother        Due to sepsis   COPD Father    Colon cancer Brother        dx in 73's   Esophageal cancer Neg Hx    Rectal cancer Neg Hx    Stomach cancer Neg Hx     PE: BP 128/78 (BP Location: Right Arm, Patient Position: Sitting, Cuff Size: Normal)   Pulse 68   Ht 6\' 1"  (1.854 m)   Wt 261 lb 6.4 oz (118.6 kg)   SpO2 97%   BMI 34.49 kg/m   Wt Readings from Last 3 Encounters:  05/16/21 261 lb 6.4 oz (118.6 kg)  05/03/21 260 lb 6.4 oz (118.1 kg)  04/23/21 260 lb 9.6 oz (118.2 kg)   Constitutional: overweight, in NAD Eyes: PERRLA, EOMI, no exophthalmos ENT: moist mucous membranes, no thyromegaly, no cervical lymphadenopathy Cardiovascular: RRR, No MRG Respiratory: CTA B Musculoskeletal: no deformities, strength intact in all 4 Skin: moist, warm, no rashes Neurological: + tremor with outstretched hands, DTR normal in all 4  ASSESSMENT: 1. DM2, insulin-dependent, uncontrolled, with complications - CHF - A. fib/a flutter - CKD - h/o Diabetic ulcer of calf - h/o HHNK - admitted with a glucose of >1000  -started on basal bolus regimen in the hospital  2.  Hypothyroidism - Uncontrolled  3. Obesity class 2  4. HL  PLAN:  1. Patient with longstanding, uncontrolled, type 2  diabetes, insulin-dependent, on basal-bolus insulin regimen and also weekly GLP-1 receptor agonist with blood sugars improved dramatically after stopping alcohol in 2021.  HbA1c decreased from 14.2% to 5.3%.  Afterwards, HbA1c increased, with the latest value from last visit being 6.0%, but still excellent.  At that time, sugars were very well controlled during the day and sugars after breakfast improved significantly after changing his diet.  However, he still had occasional hyperglycemic spikes after this meal and we discussed about adding berries to his cereals, changing from skim milk to almond or soy milk and adding healthy fats in the form of Chia seeds, flaxseeds, or nuts.  He already eliminated snacks after dinner and moved to dinners earlier which resulted in 21 pound weight loss and improvement of his blood sugars.  He lost 6 more pounds since last visit. CGM interpretation: -At today's visit, we reviewed his CGM downloads: It appears that 97% of values are in target range (goal >70%), while 3% are higher than 180 (goal <25%), and 0% are lower  than 70 (goal <4%).  The calculated average blood sugar is 128.  The projected HbA1c for the next 3 months ([facility]MI) is 6.4%. -Reviewing the CGM trends, it appears that almost all of his blood sugars are at goal, without significant hypo or hyperglycemic exceptions.  I would not suggest any changes in his regimen for now.  Upon questioning, he did make changes in his breakfast after our last visit and sugars after this meal are now  well controlled -At today's visit we discussed about possibly switching to the freestyle libre 2 CGM.  The freestyle libre 3 is not covered since he has Medicare.  He now has a 53-month supply of libre 14 sensors but will let me know when he is close to running out.  He gets them from [place]. - I suggested to:  Patient Instructions  Please continue: - Ozempic 0.5 mg weekly in a.m.  - Novolog 8-10 units based on the size of the  meal - Tresiba U200 32 units daily  Please continue Levothyroxine 50 mcg daily.  Take the thyroid hormone every day, with water, at least 30 minutes before breakfast, separated by at least 4 hours from: - acid reflux medications - calcium - iron - multivitamins  Please return in 6 months.   - we checked his HbA1c: 6.0% (stable, at goal - advised to check sugars at different times of the day - 4x a day, rotating check times - advised for yearly eye exams >> he is UTD - return to clinic in 6 months  2.  Hypothyroidism - latest thyroid labs reviewed with pt. >> normal: Lab Results  Component Value Date   TSH 3.280 04/23/2021  - he continues on LT4 50 mcg daily - pt feels good on this dose. - we discussed about taking the thyroid hormone every day, with water, >30 minutes before breakfast, separated by >4 hours from acid reflux medications, calcium, iron, multivitamins. Pt. is taking it correctly.  3. Obesity class 2 -We will continue the GLP-1 receptor agonist, which should also help with weight loss -He lost more than 20 pounds before last visit after changing diet -He lost 6 more pounds since last visit  4. HL -Reviewed latest lipid panel from 05/2020: all fractions at goal: Lab Results  Component Value Date   CHOL 143 05/16/2020   HDL 53.20 05/16/2020   LDLCALC 69 05/16/2020   LDLDIRECT 81.0 08/31/2015   TRIG 107.0 05/16/2020   CHOLHDL 3 05/16/2020  -He continues Crestor 10 mg daily without side effects. -We discussed about checking a cholesterol panel today, but he will have an APE with PCP this month  + Flu shot today  Philemon Kingdom, MD PhD [facility]

## 2021-05-16 NOTE — Patient Instructions (Addendum)
Please continue: - Ozempic 0.5 mg weekly in a.m.  - Novolog 8-10 units based on the size of the meal - Tresiba U200 32 units daily  Please continue Levothyroxine 50 mcg daily.  Take the thyroid hormone every day, with water, at least 30 minutes before breakfast, separated by at least 4 hours from: - acid reflux medications - calcium - iron - multivitamins  Please return in 6 months.

## 2021-05-21 ENCOUNTER — Ambulatory Visit (INDEPENDENT_AMBULATORY_CARE_PROVIDER_SITE_OTHER): Payer: Medicare HMO

## 2021-05-21 ENCOUNTER — Ambulatory Visit (HOSPITAL_COMMUNITY)
Admission: RE | Admit: 2021-05-21 | Discharge: 2021-05-21 | Disposition: A | Discharge status: Home / Self Care | Payer: Medicare HMO | Source: Ambulatory Visit | Attending: Internal Medicine | Admitting: Internal Medicine

## 2021-05-21 ENCOUNTER — Other Ambulatory Visit: Payer: Self-pay

## 2021-05-21 VITALS — BP 116/70 | HR 66 | Wt 259.4 lb

## 2021-05-21 DIAGNOSIS — Z7902 Long term (current) use of antithrombotics/antiplatelets: Secondary | ICD-10-CM | POA: Diagnosis not present

## 2021-05-21 DIAGNOSIS — Z8616 Personal history of COVID-19: Secondary | ICD-10-CM | POA: Diagnosis not present

## 2021-05-21 DIAGNOSIS — I4891 Unspecified atrial fibrillation: Secondary | ICD-10-CM | POA: Diagnosis not present

## 2021-05-21 DIAGNOSIS — Z79899 Other long term (current) drug therapy: Secondary | ICD-10-CM | POA: Insufficient documentation

## 2021-05-21 DIAGNOSIS — I480 Paroxysmal atrial fibrillation: Secondary | ICD-10-CM | POA: Diagnosis not present

## 2021-05-21 DIAGNOSIS — Z7901 Long term (current) use of anticoagulants: Secondary | ICD-10-CM | POA: Diagnosis not present

## 2021-05-21 DIAGNOSIS — F1090 Alcohol use, unspecified, uncomplicated: Secondary | ICD-10-CM | POA: Insufficient documentation

## 2021-05-21 DIAGNOSIS — I5022 Chronic systolic (congestive) heart failure: Secondary | ICD-10-CM | POA: Insufficient documentation

## 2021-05-21 DIAGNOSIS — I513 Intracardiac thrombosis, not elsewhere classified: Secondary | ICD-10-CM | POA: Diagnosis not present

## 2021-05-21 DIAGNOSIS — D751 Secondary polycythemia: Secondary | ICD-10-CM | POA: Insufficient documentation

## 2021-05-21 DIAGNOSIS — G4733 Obstructive sleep apnea (adult) (pediatric): Secondary | ICD-10-CM | POA: Diagnosis not present

## 2021-05-21 DIAGNOSIS — Z9989 Dependence on other enabling machines and devices: Secondary | ICD-10-CM | POA: Diagnosis not present

## 2021-05-21 DIAGNOSIS — Z86718 Personal history of other venous thrombosis and embolism: Secondary | ICD-10-CM | POA: Diagnosis not present

## 2021-05-21 DIAGNOSIS — Z7984 Long term (current) use of oral hypoglycemic drugs: Secondary | ICD-10-CM | POA: Diagnosis not present

## 2021-05-21 DIAGNOSIS — N183 Chronic kidney disease, stage 3 unspecified: Secondary | ICD-10-CM | POA: Insufficient documentation

## 2021-05-21 DIAGNOSIS — E1122 Type 2 diabetes mellitus with diabetic chronic kidney disease: Secondary | ICD-10-CM | POA: Insufficient documentation

## 2021-05-21 DIAGNOSIS — I422 Other hypertrophic cardiomyopathy: Secondary | ICD-10-CM | POA: Diagnosis not present

## 2021-05-21 DIAGNOSIS — I130 Hypertensive heart and chronic kidney disease with heart failure and stage 1 through stage 4 chronic kidney disease, or unspecified chronic kidney disease: Secondary | ICD-10-CM | POA: Diagnosis not present

## 2021-05-21 LAB — POCT INR: INR: 2.2 (ref 2.0–3.0)

## 2021-05-21 MED ORDER — CARVEDILOL 12.5 MG PO TABS: 12.5000 mg | ORAL_TABLET | Freq: Two times a day (BID) | ORAL | 3 refills | Status: DC

## 2021-05-21 NOTE — Patient Instructions (Signed)
It was a pleasure seeing you today!  MEDICATIONS: -We are changing your medications today -Increase carvedilol to 12.5 mg (1 tablet) twice daily -Call if you have questions about your medications.   NEXT APPOINTMENT: Return to clinic in 1 month with Dr. Aundra Dubin.  In general, to take care of your heart failure: -Limit your fluid intake to 2 Liters (half-gallon) per day.   -Limit your salt intake to ideally 2-3 grams (2000-3000 mg) per day. -Weigh yourself daily and record, and bring that "weight diary" to your next appointment.  (Weight gain of 2-3 pounds in 1 day typically means fluid weight.) -The medications for your heart are to help your heart and help you live longer.   -Please contact us before stopping any of your heart medications.  Call the clinic at [phone] with questions or to reschedule future appointments.

## 2021-05-21 NOTE — Patient Instructions (Signed)
Description   Start taking 1.5 tablets daily except 1 tablet on Mondays and Fridays.  Recheck INR in 2 weeks. Coumadin Clinic [phone].

## 2021-05-21 NOTE — Telephone Encounter (Signed)
Received communication from [facility] that the patient needs to provide POI in order to finish processing his application. Sent that information in via fax.

## 2021-06-01 ENCOUNTER — Other Ambulatory Visit: Payer: Self-pay | Admitting: Cardiology

## 2021-06-05 ENCOUNTER — Other Ambulatory Visit: Payer: Self-pay

## 2021-06-05 ENCOUNTER — Ambulatory Visit (INDEPENDENT_AMBULATORY_CARE_PROVIDER_SITE_OTHER): Payer: Medicare HMO

## 2021-06-05 DIAGNOSIS — Z5181 Encounter for therapeutic drug level monitoring: Secondary | ICD-10-CM

## 2021-06-05 DIAGNOSIS — I4891 Unspecified atrial fibrillation: Secondary | ICD-10-CM | POA: Diagnosis not present

## 2021-06-05 DIAGNOSIS — I513 Intracardiac thrombosis, not elsewhere classified: Secondary | ICD-10-CM | POA: Diagnosis not present

## 2021-06-05 LAB — POCT INR: INR: 3.1 — AB (ref 2.0–3.0)

## 2021-06-05 NOTE — Patient Instructions (Signed)
Description   Eat greens tonight and Continue taking 1.5 tablets daily except 1 tablet on Mondays and Fridays.  Recheck INR in 4 weeks. Amiodarone discontinued on 05/03/21.  Coumadin Clinic [phone].

## 2021-06-06 NOTE — Telephone Encounter (Signed)
Advanced Heart Failure Patient Advocate Encounter   Patient was approved to receive Jardiance from [employer]   Effective dates: 06/05/21 through 06/15/22

## 2021-06-10 ENCOUNTER — Other Ambulatory Visit: Payer: Self-pay | Admitting: Internal Medicine

## 2021-06-20 ENCOUNTER — Ambulatory Visit (INDEPENDENT_AMBULATORY_CARE_PROVIDER_SITE_OTHER): Payer: Medicare HMO

## 2021-06-20 ENCOUNTER — Other Ambulatory Visit: Payer: Self-pay

## 2021-06-20 DIAGNOSIS — Z0000 Encounter for general adult medical examination without abnormal findings: Secondary | ICD-10-CM | POA: Diagnosis not present

## 2021-06-20 NOTE — Progress Notes (Signed)
I connected with [name] today by telephone and verified that I am speaking with the correct person using two identifiers. Location patient: home Location provider: work Persons participating in the virtual visit: patient, provider.   I discussed the limitations, risks, security and privacy concerns of performing an evaluation and management service by telephone and the availability of in person appointments. I also discussed with the patient that there may be a patient responsible charge related to this service. The patient expressed understanding and verbally consented to this telephonic visit.    Interactive audio and video telecommunications were attempted between this provider and patient, however failed, due to patient having technical difficulties OR patient did not have access to video capability.  We continued and completed visit with audio only.  Some vital signs may be absent or patient reported.   Time Spent with patient on telephone encounter: 40 minutes  Subjective:   [name] is a 66 y.o. male who presents for Medicare Annual/Subsequent preventive examination.  Review of Systems     Cardiac Risk Factors include: advanced age (>29men, >75 women);diabetes mellitus;family history of premature cardiovascular disease;dyslipidemia;hypertension;male gender;obesity (BMI >30kg/m2)     Objective:    There were no vitals filed for this visit. There is no height or weight on file to calculate BMI.  Advanced Directives 06/20/2021 10/18/2020 08/29/2020 08/03/2020 08/03/2020 09/06/2019 02/01/2019  Does Patient Have a Medical Advance Directive? Yes No Yes Yes Yes Yes No  Type of Advance Directive Living will;Healthcare Power of Attorney - Living will;Healthcare Power of [place];Living will [place];Living will Living will -  Does patient want to make changes to medical advance directive? No - Patient declined - - No - Patient  declined - No - Patient declined -  Copy of [place] in Chart? No - copy requested - No - copy requested No - copy requested No - copy requested - -  Would patient like information on creating a medical advance directive? - - - - - - No - Patient declined    Current Medications (verified) Outpatient Encounter Medications as of 06/20/2021  Medication Sig   albuterol (PROVENTIL HFA;VENTOLIN HFA) 108 (90 Base) MCG/ACT inhaler Inhale 2 puffs into the lungs every 6 (six) hours as needed for wheezing or shortness of breath.   BD PEN NEEDLE NANO 2ND GEN 32G X 4 MM MISC USE 4 TIMES A DAY AS DIRECTED   BD PEN NEEDLE NANO 2ND GEN 32G X 4 MM MISC USE 4 TIMES A DAY AS DIRECTED   budesonide-formoterol (SYMBICORT) 80-4.5 MCG/ACT inhaler Inhale 2 puffs into the lungs as needed.   carvedilol (COREG) 12.5 MG tablet Take 1 tablet (12.5 mg total) by mouth 2 (two) times daily with a meal.   cholecalciferol (VITAMIN D3) 25 MCG (1000 UNIT) tablet Take 1,000 Units by mouth daily.   Continuous Blood Gluc Receiver (FREESTYLE LIBRE 14 DAY READER) DEVI USE AS DIRECTED   doxylamine, Sleep, (UNISOM) 25 MG tablet Take 50 mg by mouth at bedtime.   empagliflozin (JARDIANCE) 10 MG TABS tablet Take 1 tablet (10 mg total) by mouth daily.   furosemide (LASIX) 20 MG tablet Take 10 mg by mouth 2 (two) times daily.   insulin aspart (NOVOLOG) 100 UNIT/ML FlexPen Inject 8-12 Units into the skin 3 (three) times daily with meals.   Insulin Degludec (TRESIBA FLEXTOUCH [place]) Inject 32 Units into the skin daily.   levothyroxine (SYNTHROID) 50 MCG tablet TAKE 1 TABLET  BY MOUTH EVERY DAY BEFORE BREAKFAST   mometasone (NASONEX) 50 MCG/ACT nasal spray Place 2 sprays into the nose daily as needed (allergies or rhinitis).   Multiple Vitamins-Minerals (MULTIVITAMIN WITH MINERALS) tablet Take 2 tablets by mouth at bedtime. Chewable   OZEMPIC, 0.25 OR 0.5 MG/DOSE, 2 MG/1.5ML SOPN INJECT 0.5 MG INTO THE SKIN ONCE A WEEK.    sacubitril-valsartan (ENTRESTO) 97-103 MG Take 1 tablet by mouth 2 (two) times daily.   spironolactone (ALDACTONE) 25 MG tablet TAKE 1 TABLET BY MOUTH EVERY DAY   vitamin C (ASCORBIC ACID) 500 MG tablet Take 500 mg by mouth in the morning.   warfarin (COUMADIN) 5 MG tablet TAKE 1 TABLET TO 1 AND 1/2 TABLETS BY MOUTH DAILY AS DIRECTED BY THE COUMADIN CLINIC.   No facility-administered encounter medications on file as of 06/20/2021.    Allergies (verified) Patient has no allergy information on record.   History: Past Medical History:  Diagnosis Date   Asthma    as a teenager - does not use an inhaler although pt said he has an albuteral inhaler   Atrial fibrillation (HCC)    persistant 02/2009   Atrial flutter (HCC)    s/p CTI ablation 08/06/10   Cataract    Chronic rhinitis    Colon polyp    Congestive heart failure ([place])    Diverticulosis    colonoscopy 04/03/2009   Hyperlipidemia    Hypertension    Morbid obesity ([place])    target weight = 219  for BMI < 30   Nonischemic cardiomyopathy (HCC)    tachycardia mediated   Seasonal allergies    Sleep apnea    original 2001 - wears C-PaP   Type 2 diabetes mellitus ([place])    Past Surgical History:  Procedure Laterality Date   ATRIAL FIBRILLATION ABLATION N/A 12/14/2018   Procedure: ATRIAL FIBRILLATION ABLATION;  Surgeon: Thompson Grayer, MD;  Location: [facility];  Service: Cardiovascular;  Laterality: N/A;   ATRIAL FIBRILLATION ABLATION N/A 10/18/2020   Procedure: ATRIAL FIBRILLATION ABLATION;  Surgeon: Thompson Grayer, MD;  Location: [facility];  Service: Cardiovascular;  Laterality: N/A;   atrial flutter ablation  08/06/10   CARDIOVERSION N/A 07/05/2018   Procedure: CARDIOVERSION;  Surgeon: Buford Dresser, MD;  Location[facility] ENDOSCOPY;  Service: Cardiovascular;  Laterality: N/A;   CARDIOVERSION N/A 08/08/2020   Procedure: CARDIOVERSION;  Surgeon: Larey Dresser, MD;  Location: [facility] ENDOSCOPY;  Service: Cardiovascular;   Laterality: N/A;   CARDIOVERSION N/A 08/29/2020   Procedure: CARDIOVERSION;  Surgeon: Jolaine Artist, MD;  Location: [place];  Service: Cardiovascular;  Laterality: N/A;   COLONOSCOPY     POLYPECTOMY     RIGHT HEART CATH N/A 08/06/2020   Procedure: RIGHT HEART CATH;  Surgeon: Larey Dresser, MD;  Location: [place] [facility];  Service: Cardiovascular;  Laterality: N/A;   TEE WITHOUT CARDIOVERSION N/A 12/14/2018   Procedure: TRANSESOPHAGEAL ECHOCARDIOGRAM (TEE);  Surgeon: Thompson Grayer, MD;  Location: [place] [facility];  Service: Cardiovascular;  Laterality: N/A;   TEE WITHOUT CARDIOVERSION N/A 08/03/2020   Procedure: TRANSESOPHAGEAL ECHOCARDIOGRAM (TEE);  Surgeon: Sanda Klein, MD;  Location: [place];  Service: Cardiovascular;  Laterality: N/A;   TEE WITHOUT CARDIOVERSION N/A 08/08/2020   Procedure: TRANSESOPHAGEAL ECHOCARDIOGRAM (TEE);  Surgeon: Larey Dresser, MD;  Location: [facility] ENDOSCOPY;  Service: Cardiovascular;  Laterality: N/A;   TEE WITHOUT CARDIOVERSION N/A 10/18/2020   Procedure: TRANSESOPHAGEAL ECHOCARDIOGRAM (TEE);  Surgeon: Jerline Pain, MD;  Location: [facility] ENDOSCOPY;  Service: Cardiovascular;  Laterality: N/A;   Family History  Problem Relation Age of Onset   Hypertension Mother    Kidney failure Mother        Due to sepsis   COPD Father    Colon cancer Brother        dx in 62's   Esophageal cancer Neg Hx    Rectal cancer Neg Hx    Stomach cancer Neg Hx    Social History   Socioeconomic History   Marital status: Single    Spouse name: Not on file   Number of children: 0   Years of education: Not on file   Highest education level: Not on file  Occupational History   Occupation: Sales  Tobacco Use   Smoking status: Never   Smokeless tobacco: Never  Vaping Use   Vaping Use: Never used  Substance and Sexual Activity   Alcohol use: Yes    Alcohol/week: 21.0 standard drinks    Types: 21 Cans of beer per week    Comment: previously quite heavy,  working on cessation    Drug use: No   Sexual activity: Not on file  Other Topics Concern   Not on file  Social History Narrative   Lives in [place]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   Social Determinants of Health   Financial Resource Strain: Low Risk    Difficulty of Paying Living Expenses: Not hard at all  Food Insecurity: No Food Insecurity   Worried About Charity fundraiser in the Last Year: Never true   Arboriculturist in the Last Year: Never true  Transportation Needs: No Transportation Needs   Lack of Transportation (Medical): No   Lack of Transportation (Non-Medical): No  Physical Activity: Sufficiently Active   Days of Exercise per Week: 5 days   Minutes of Exercise per Session: 30 min  Stress: No Stress Concern Present   Feeling of Stress : Not at all  Social Connections: Unknown   Frequency of Communication with Friends and Family: More than three times a week   Frequency of Social Gatherings with Friends and Family: Twice a week   Attends Religious Services: Patient refused   Printmaker: Patient refused   Attends Music therapist: Patient refused   Marital Status: Never married    Tobacco Counseling Counseling given: Not Answered   Clinical Intake:  Pre-visit preparation completed: Yes  Pain : No/denies pain     Nutritional Risks: None Diabetes: Yes CBG done?: No Did pt. bring in CBG monitor from home?: No  How often do you need to have someone help you when you read instructions, pamphlets, or other written materials from your doctor or pharmacy?: 1 - Never What is the last grade level you completed in school?: 2 years of [employer]  Diabetic? yes  Interpreter Needed?: No  Information entered by :: Lisette Abu, LPN   Activities of Daily Living In your present state of health, do you have any difficulty performing the following activities: 06/20/2021 08/03/2020  Hearing? N N  Vision? N N   Difficulty concentrating or making decisions? N N  Walking or climbing stairs? N Y  Dressing or bathing? N N  Doing errands, shopping? N N  Preparing Food and eating ? N -  Using the Toilet? N -  In the past six months, have you accidently leaked urine? N -  Do you have problems with loss of bowel control? N -  Managing your Medications? N -  Managing your Finances? N -  Housekeeping or managing your Housekeeping? N -  Some recent data might be hidden    Patient Care Team: [name], MD as PCP - General (Internal Medicine) Buford Dresser, MD as PCP - Cardiology (Cardiology) Thompson Grayer, MD as PCP - Electrophysiology (Cardiology)  Indicate any recent Medical Services you may have received from other than [facility] providers in the past year (date may be approximate).     Assessment:   This is a routine wellness examination for [place].  Hearing/Vision screen Hearing Screening - Comments:: Patient denied any hearing difficulty.   No hearing aids.  Vision Screening - Comments:: Patient wears corrective glasses/contacts.  Eye exam done annually by: Dr. Chase Caller.  Dietary issues and exercise activities discussed: Current Exercise Habits: Home exercise routine, Type of exercise: walking;Other - see comments (golfing), Time (Minutes): 30, Frequency (Times/Week): 5, Weekly Exercise (Minutes/Week): 150, Intensity: Moderate, Exercise limited by: cardiac condition(s);respiratory conditions(s)   Goals Addressed               This Visit's Progress     Client understands the importance of follow-up with providers by attending scheduled visits (pt-stated)        My goal is to lose weight.  I would like to be 245 pounds.      Depression Screen PHQ 2/9 Scores 06/20/2021 05/16/2020 05/16/2020 03/08/2019 11/26/2017  PHQ - 2 Score 0 0 0 0 0  PHQ- 9 Score - 0 - - -    Fall Risk Fall Risk  06/20/2021 05/16/2020  Falls in the past year? 0 0  Number falls in past yr: 0 0   Injury with Fall? 0 -  Risk for fall due to : No Fall Risks -  Follow up Falls prevention discussed Falls evaluation completed    [place]:  Any stairs in or around the home? Yes  If so, are there any without handrails? No  Home free of loose throw rugs in walkways, pet beds, electrical cords, etc? Yes  Adequate lighting in your home to reduce risk of falls? Yes   ASSISTIVE DEVICES UTILIZED TO PREVENT FALLS:  Life alert? No  Use of a cane, walker or w/c? No  Grab bars in the bathroom? No  Shower chair or bench in shower? No  Elevated toilet seat or a handicapped toilet? No   TIMED UP AND GO:  Was the test performed? No .  Length of time to ambulate 10 feet: n/a sec.   Gait steady and fast without use of assistive device  Cognitive Function: Normal cognitive status assessed by direct observation by this Nurse Health Advisor. No abnormalities found.          Immunizations Immunization History  Administered Date(s) Administered   Fluad Quad(high Dose 65+) 04/10/2020, 05/16/2021   Influenza, Seasonal, Injecte, Preservative Fre 05/12/2017   Influenza,inj,Quad PF,6+ Mos 05/08/2014, 05/15/2016, 03/18/2018, 03/08/2019   Influenza-Unspecified 05/01/2015   PFIZER(Purple Top)SARS-COV-2 Vaccination 09/16/2019, 10/07/2019   Pneumococcal Conjugate-13 05/16/2020   Pneumococcal Polysaccharide-23 11/15/2006   Td 03/09/2009   Tdap 10/12/2014   Zoster Recombinat (Shingrix) 01/12/2018, 03/18/2018   Zoster, Live 09/17/2015    TDAP status: Up to date  Flu Vaccine status: Up to date  Pneumococcal vaccine status: Due, Education has been provided regarding the importance of this vaccine. Advised may receive this vaccine at local pharmacy or Health Dept. Aware to provide a copy of the vaccination record  if obtained from local pharmacy or Health Dept. Verbalized acceptance and understanding.  Covid-19 vaccine status: Completed vaccines  Qualifies for  Shingles Vaccine? Yes   Zostavax completed Yes   Shingrix Completed?: Yes  Screening Tests Health Maintenance  Topic Date Due   OPHTHALMOLOGY EXAM  10/30/2017   COVID-19 Vaccine (3 - Pfizer risk series) 11/04/2019   Pneumonia Vaccine 45+ Years old (3 - PPSV23 if available, else PCV20) 05/16/2021   FOOT EXAM  05/17/2021   HEMOGLOBIN A1C  11/14/2021   COLONOSCOPY (Pts 45-48yrs Insurance coverage will need to be confirmed)  05/09/2022   TETANUS/TDAP  10/11/2024   INFLUENZA VACCINE  Completed   Hepatitis C Screening  Completed   Zoster Vaccines- Shingrix  Completed   HPV VACCINES  Aged Out    Health Maintenance  Health Maintenance Due  Topic Date Due   OPHTHALMOLOGY EXAM  10/30/2017   COVID-19 Vaccine (3 - Pfizer risk series) 11/04/2019   Pneumonia Vaccine 73+ Years old (3 - PPSV23 if available, else PCV20) 05/16/2021   FOOT EXAM  05/17/2021    Colorectal cancer screening: Type of screening: Colonoscopy. Completed 05/10/2019. Repeat every 3 years  Lung Cancer Screening: (Low Dose CT Chest recommended if Age 64-80 years, 30 pack-year currently smoking OR have quit w/in 15years.) does not qualify.   Lung Cancer Screening Referral: no  Additional Screening:  Hepatitis C Screening: does qualify; Completed yes  Vision Screening: Recommended annual ophthalmology exams for early detection of glaucoma and other disorders of the eye. Is the patient up to date with their annual eye exam?  Yes  Who is the provider or what is the name of the office in which the patient attends annual eye exams? Dr. Chase Caller If pt is not established with a provider, would they like to be referred to a provider to establish care? No .   Dental Screening: Recommended annual dental exams for proper oral hygiene  Community Resource Referral / Chronic Care Management: CRR required this visit?  No   CCM required this visit?  No      Plan:     I have personally reviewed and noted the following  in the patients chart:   Medical and social history Use of alcohol, tobacco or illicit drugs  Current medications and supplements including opioid prescriptions. Patient is not currently taking opioid prescriptions. Functional ability and status Nutritional status Physical activity Advanced directives List of other physicians Hospitalizations, surgeries, and ER visits in previous 12 months Vitals Screenings to include cognitive, depression, and falls Referrals and appointments  In addition, I have reviewed and discussed with patient certain preventive protocols, quality metrics, and best practice recommendations. A written personalized care plan for preventive services as well as general preventive health recommendations were provided to patient.     Sheral Flow, LPN   10/21/8467   Nurse Notes:  Patient is cogitatively intact. There were no vitals filed for this visit. There is no height or weight on file to calculate BMI. Patient stated that he has no issues with gait or balance; does not use any assistive devices. Medications reviewed with patient; no opioid use noted. Hearing Screening - Comments:: Patient denied any hearing difficulty.   No hearing aids.  Vision Screening - Comments:: Patient wears corrective glasses/contacts.  Eye exam done annually by: Dr. Chase Caller.

## 2021-06-21 ENCOUNTER — Encounter: Payer: Self-pay | Admitting: Internal Medicine

## 2021-06-21 ENCOUNTER — Ambulatory Visit (INDEPENDENT_AMBULATORY_CARE_PROVIDER_SITE_OTHER): Payer: Medicare HMO | Admitting: Internal Medicine

## 2021-06-21 VITALS — BP 122/82 | HR 62 | Resp 18 | Ht 73.0 in | Wt 265.2 lb

## 2021-06-21 DIAGNOSIS — Z23 Encounter for immunization: Secondary | ICD-10-CM | POA: Diagnosis not present

## 2021-06-21 DIAGNOSIS — Z0000 Encounter for general adult medical examination without abnormal findings: Secondary | ICD-10-CM | POA: Diagnosis not present

## 2021-06-21 DIAGNOSIS — I5022 Chronic systolic (congestive) heart failure: Secondary | ICD-10-CM

## 2021-06-21 DIAGNOSIS — E1169 Type 2 diabetes mellitus with other specified complication: Secondary | ICD-10-CM

## 2021-06-21 DIAGNOSIS — E039 Hypothyroidism, unspecified: Secondary | ICD-10-CM | POA: Diagnosis not present

## 2021-06-21 DIAGNOSIS — E785 Hyperlipidemia, unspecified: Secondary | ICD-10-CM

## 2021-06-21 DIAGNOSIS — J4520 Mild intermittent asthma, uncomplicated: Secondary | ICD-10-CM | POA: Diagnosis not present

## 2021-06-21 NOTE — Assessment & Plan Note (Signed)
Foot exam done, endo is managing insulin regimen and last HgA1c 6.0. Is taking jardiance as well. No low sugars. Reminded about yearly eye exam. Is on ACE-I. Not on statin currently. Due for lipid panel we will bundle with next visit/blood draw.

## 2021-06-21 NOTE — Progress Notes (Signed)
° °  Subjective:   Patient ID: [name], male    DOB: [birth year], 66 y.o.   MRN: [id number]  HPI The patient is a 66 YO man coming in for physical.   PMH, [place], social history reviewed and updated  Review of Systems  Constitutional: Negative.   HENT: Negative.    Eyes: Negative.   Respiratory:  Negative for cough, chest tightness and shortness of breath.   Cardiovascular:  Negative for chest pain, palpitations and leg swelling.  Gastrointestinal:  Negative for abdominal distention, abdominal pain, constipation, diarrhea, nausea and vomiting.  Musculoskeletal: Negative.   Skin: Negative.   Neurological: Negative.   Psychiatric/Behavioral: Negative.     Objective:  Physical Exam Constitutional:      Appearance: He is well-developed.  HENT:     Head: Normocephalic and atraumatic.  Cardiovascular:     Rate and Rhythm: Normal rate and regular rhythm.  Pulmonary:     Effort: Pulmonary effort is normal. No respiratory distress.     Breath sounds: Normal breath sounds. No wheezing or rales.  Abdominal:     General: Bowel sounds are normal. There is no distension.     Palpations: Abdomen is soft.     Tenderness: There is no abdominal tenderness. There is no rebound.  Musculoskeletal:     Cervical back: Normal range of motion.  Skin:    General: Skin is warm and dry.     Comments: Foot exam done  Neurological:     Mental Status: He is alert and oriented to person, place, and time.     Coordination: Coordination normal.    Vitals:   06/21/21 1031  BP: 122/82  Pulse: 62  Resp: 18  SpO2: 98%  Weight: 265 lb 3.2 oz (120.3 kg)  Height: 6\' 1"  (1.854 m)    This visit occurred during the SARS-CoV-2 public health emergency.  Safety protocols were in place, including screening questions prior to the visit, additional usage of staff PPE, and extensive cleaning of exam room while observing appropriate contact time as indicated for disinfecting solutions.   Assessment & Plan:   Pneumonia 23 given at visit

## 2021-06-21 NOTE — Assessment & Plan Note (Signed)
Due for lipid panel and will ask cardiology to draw at next visit to combine lab draw. If they are not doing labs will order and have him come separately for this. Off statin for now. Could resume if needed.

## 2021-06-21 NOTE — Assessment & Plan Note (Signed)
Uses albuterol and symbicort. Continue and no flare today.

## 2021-06-21 NOTE — Assessment & Plan Note (Signed)
Flu shot up to date. Covid-19 up to date. Pneumonia 23 given to complete series. Shingrix complete. Tetanus due 2026. Colonoscopy due 2023. Counseled about sun safety and mole surveillance. Counseled about the dangers of distracted driving. Given 10 year screening recommendations.

## 2021-06-21 NOTE — Assessment & Plan Note (Signed)
Weight is up some diet to weight gain. No signs of fluid overload. Is taking entresto and coreg and spironolactone.

## 2021-06-21 NOTE — Assessment & Plan Note (Signed)
Recent TSH reviewed and normal. Taking synthroid 50 mcg daily.

## 2021-06-24 ENCOUNTER — Other Ambulatory Visit: Payer: Self-pay | Admitting: Internal Medicine

## 2021-06-28 ENCOUNTER — Telehealth (HOSPITAL_COMMUNITY): Payer: Self-pay | Admitting: Pharmacist

## 2021-06-28 ENCOUNTER — Other Ambulatory Visit (HOSPITAL_COMMUNITY): Payer: Self-pay | Admitting: Adult Health

## 2021-06-28 DIAGNOSIS — I5022 Chronic systolic (congestive) heart failure: Secondary | ICD-10-CM

## 2021-06-28 NOTE — Telephone Encounter (Signed)
Advanced Heart Failure Patient Advocate Encounter   Patient was approved to receive Entresto from [employer].   Patient ID: [id number] Effective dates: 06/16/21 through 06/15/22  Audry Riles, PharmD, BCPS, BCCP, CPP Heart Failure Clinic Pharmacist [phone]

## 2021-07-03 ENCOUNTER — Ambulatory Visit (INDEPENDENT_AMBULATORY_CARE_PROVIDER_SITE_OTHER): Payer: Medicare HMO

## 2021-07-03 ENCOUNTER — Other Ambulatory Visit: Payer: Self-pay

## 2021-07-03 DIAGNOSIS — I4891 Unspecified atrial fibrillation: Secondary | ICD-10-CM | POA: Diagnosis not present

## 2021-07-03 DIAGNOSIS — Z5181 Encounter for therapeutic drug level monitoring: Secondary | ICD-10-CM | POA: Diagnosis not present

## 2021-07-03 DIAGNOSIS — I513 Intracardiac thrombosis, not elsewhere classified: Secondary | ICD-10-CM

## 2021-07-03 LAB — POCT INR: INR: 3 (ref 2.0–3.0)

## 2021-07-03 NOTE — Patient Instructions (Addendum)
Description   Continue taking 1.5 tablets daily except 1 tablet on Mondays and Fridays.  Recheck INR in 4 weeks. Amiodarone discontinued on 05/03/21.  Coumadin Clinic [phone].

## 2021-07-05 ENCOUNTER — Ambulatory Visit (HOSPITAL_COMMUNITY)
Admission: RE | Admit: 2021-07-05 | Discharge: 2021-07-05 | Disposition: A | Discharge status: Home / Self Care | Payer: Medicare HMO | Source: Ambulatory Visit | Attending: Cardiology | Admitting: Cardiology

## 2021-07-05 ENCOUNTER — Other Ambulatory Visit: Payer: Self-pay

## 2021-07-05 ENCOUNTER — Other Ambulatory Visit: Payer: Self-pay | Admitting: Internal Medicine

## 2021-07-05 ENCOUNTER — Encounter (HOSPITAL_COMMUNITY): Payer: Self-pay | Admitting: Cardiology

## 2021-07-05 ENCOUNTER — Encounter: Payer: Self-pay | Admitting: Cardiology

## 2021-07-05 VITALS — BP 100/70 | HR 67 | Wt 260.6 lb

## 2021-07-05 DIAGNOSIS — I480 Paroxysmal atrial fibrillation: Secondary | ICD-10-CM | POA: Insufficient documentation

## 2021-07-05 DIAGNOSIS — Z79899 Other long term (current) drug therapy: Secondary | ICD-10-CM | POA: Diagnosis not present

## 2021-07-05 DIAGNOSIS — Z7984 Long term (current) use of oral hypoglycemic drugs: Secondary | ICD-10-CM | POA: Insufficient documentation

## 2021-07-05 DIAGNOSIS — Z7901 Long term (current) use of anticoagulants: Secondary | ICD-10-CM | POA: Diagnosis not present

## 2021-07-05 DIAGNOSIS — I5022 Chronic systolic (congestive) heart failure: Secondary | ICD-10-CM | POA: Insufficient documentation

## 2021-07-05 DIAGNOSIS — G4733 Obstructive sleep apnea (adult) (pediatric): Secondary | ICD-10-CM | POA: Diagnosis not present

## 2021-07-05 DIAGNOSIS — D751 Secondary polycythemia: Secondary | ICD-10-CM | POA: Insufficient documentation

## 2021-07-05 DIAGNOSIS — E1136 Type 2 diabetes mellitus with diabetic cataract: Secondary | ICD-10-CM | POA: Insufficient documentation

## 2021-07-05 DIAGNOSIS — N183 Chronic kidney disease, stage 3 unspecified: Secondary | ICD-10-CM | POA: Diagnosis not present

## 2021-07-05 DIAGNOSIS — Z8616 Personal history of COVID-19: Secondary | ICD-10-CM | POA: Insufficient documentation

## 2021-07-05 DIAGNOSIS — I4892 Unspecified atrial flutter: Secondary | ICD-10-CM | POA: Insufficient documentation

## 2021-07-05 DIAGNOSIS — I4891 Unspecified atrial fibrillation: Secondary | ICD-10-CM

## 2021-07-05 DIAGNOSIS — I130 Hypertensive heart and chronic kidney disease with heart failure and stage 1 through stage 4 chronic kidney disease, or unspecified chronic kidney disease: Secondary | ICD-10-CM | POA: Diagnosis not present

## 2021-07-05 DIAGNOSIS — E1122 Type 2 diabetes mellitus with diabetic chronic kidney disease: Secondary | ICD-10-CM | POA: Insufficient documentation

## 2021-07-05 DIAGNOSIS — Z09 Encounter for follow-up examination after completed treatment for conditions other than malignant neoplasm: Secondary | ICD-10-CM | POA: Insufficient documentation

## 2021-07-05 LAB — BASIC METABOLIC PANEL
Anion gap: 8 (ref 5–15)
BUN: 22 mg/dL (ref 8–23)
CO2: 27 mmol/L (ref 22–32)
Calcium: 9.3 mg/dL (ref 8.9–10.3)
Chloride: 101 mmol/L (ref 98–111)
Creatinine, Ser: 1.53 mg/dL — ABNORMAL HIGH (ref 0.61–1.24)
GFR, Estimated: 50 mL/min — ABNORMAL LOW (ref >60)
Glucose, Bld: 95 mg/dL (ref 70–99)
Potassium: 4 mmol/L (ref 3.5–5.1)
Sodium: 136 mmol/L (ref 135–145)

## 2021-07-05 LAB — BRAIN NATRIURETIC PEPTIDE: B Natriuretic Peptide: 144 pg/mL — ABNORMAL HIGH (ref 0.0–100.0)

## 2021-07-05 NOTE — Progress Notes (Signed)
Blood collected for Cardiomyopathy genetic testing per Dr Aundra Dubin.  Order form completed and both shipped by Fed[employer] to [facility].

## 2021-07-05 NOTE — Patient Instructions (Signed)
PLEASE CALL THE OFFICE ONCE YOU HAVE SPOKEN WITH YOUR BROTHER SO THAT WE CAN SCHEDULE YOUR RIGHT/LEFT HEART CATHETERIZATION AS SOON AS POSSIBLE.  Labs done today, your results will be available in MyChart, we will contact you for abnormal readings.  Genetic test has been done, this has to be sent to [place] to be processed and can take 1-2 weeks to get results back.  We will let you know the results.   1. Please arrive at the [facility] (Main Entrance A) at [facility]: [address] [place], [place] [address] two hours before your procedure to ensure your preparation). Free valet parking service is available.   Special note: Every effort is made to have your procedure done on time. Please understand that emergencies sometimes delay scheduled procedures.  2. Diet: Do not eat solid foods after midnight.  The patient may have clear liquids until 5am upon the day of the procedure.  3. Labs: You will need to have blood drawn on 07/05/2021  4. Medication instructions in preparation for your procedure:   Contrast Allergy: No   Hold warfarin 3 days before procedure  Hold Furosemide and Spironolactone morning of procedure  Take half dose of insulin the night before the procedure. Please hold insulin morning of procedure.   On the morning of your procedure, take any morning medicines NOT listed above.  You may use sips of water.  5. Plan for one night stay--bring personal belongings. 6. Bring a current list of your medications and current insurance cards. 7. You MUST have a responsible person to drive you home. 8. Someone MUST be with you the first 24 hours after you arrive home or your discharge will be delayed. 9. Please wear clothes that are easy to get on and off and wear slip-on shoes.

## 2021-07-07 NOTE — Progress Notes (Signed)
PCP:  Dr Sharlet Salina  Primary Cardiologist: Dr Harrell Gave HF MD: Dr Aundra Dubin  EP: Dr Rayann Heman   HPI: 66 y.o. male w/ h/o cardiomyopathy, presumed to be tachy-mediated from atrial fibrillation/atrial flutter.  Paroxsymal Afib/flutter dates back to ~2012. Had Aflutter ablation in 2012. He had several echos between 2012-2018 showing normal LVEF. Found to have reduced EF on echo in 04/2018, down to 30-35%. Was in afib at the time. 11/2018 was referred for Afib ablation but procedure was aborted after TEE showed LA thrombus, despite being on Xarelto. Anticoagulation changed to Pradaxa. He ultimately spontaneously converted back to NSR. Repeat echo 10/20 showed EF 35-40%.  Plans were made for [facility] 10/20 however had asymptomatic positive COVID test, then renal failure. Ischemic eval changed to [employer] which showed no ischemia in 10/20. Echo repeated 3/21, EF 40-45%, RV normal.    Seen in cardiology clinic, back in atrial fibrillation with RVR.  Presented for outpatient DCCV 08/03/20. TEE showed LA thrombus. DCCV canceled. EF lower on TEE ~5-10% with RV severely reduced. Admitted and started on IV Lasix and PICC line was placed. Milrinone started for low output, initial co-ox 35%. Pradaxa stopped and he was placed on coumadin w/ heparin bridge. Placed on IV amio for afib. He had repeat TEE on 08/07/20 that showed no LAA thrombus so DCCV was done successfully. Transitioned to PO amio 200 mg bid. Remained in NSR. Diuresed well w/ IV Lasix and able to wean of milrinone. Discharge weight 264 pounds.   He went back into atrial fibrillation in 3/22, had another DCCV back to NSR.  He had a TEE in 5/22 showing EF 30-35%, then had atrial fibrillation ablation.    Echo in 7/22 showed showed EF 40-45% with normal RV and moderate MR.   Patient has polycythemia and was noted to be a compound heterozygote for hereditary hemochromatosis.  Cardiac MRI was done in 11/22 showing EF 27%, moderate asymmetric septal  hypertrophy, diffuse hypokinesis worse in the septal wall, RV EF 30%, noncoronary LGE with diffuse mid-wall involvement especially in the septum, T2* sequences not suggestive of cardiac hemochromatosis, ECV 50% in the septum.  Echo in 11/22 showed EF 30-35%, severe asymmetric septal hypertrophy, no LVOT gradient or mitral valve SAM, mild RV dilation with mildly decreased RV systolic function.    Patient saw hematology, will not get therapeutic phlebotomy as long as ferritin < 200 and transferrin saturation < 75%.   PYP scan in 11/22 was not suggestive of TTR cardiac amyloidosis.   He returns today for followup of CHF.  He is in NSR.  Rarely drinking ETOH (prior heavy).  He works full time and plays golf.  No palpitations.  Generally feels good.  No significant chest pain or exertional dyspnea.  No orthopnea/PND.    Labs (5/22): K 4.1, creatinine 1.49 Labs (9/22): hgb 20.3/HCT 60.1, K 4.9, creatinine 1.46, ferritin 116 Labs (11/22): Negative myeloma panel.  K 4.6, creatinine 1.27, LFTs normal, TSH normal.   ECG (personally reviewed): NSR, IVCD 120 msec  PMH: 1. Atrial flutter: Ablation 2012.  2. Atrial fibrillation: H/o LAA thrombus on both Xarelto and Pradaxa.  DCCV 2/22, 3/22. Atrial fibrillation ablation in 5/22.  3. Chronic systolic CHF: ?tachycardia-mediated CMP with AF/RVR.  - Cardiolite (10/20): No ischemia.  - TEE (2/22): EF 5-10% with severe RV dysfunction.  - Echo (7/22): EF 40-45%, normal RV, moderate MR.  - Cardiac MRI (11/22): EF 27%, moderate asymmetric septal hypertrophy, diffuse hypokinesis worse in the septal wall, RV EF  30%, noncoronary LGE with diffuse mid-wall involvement especially in the septum, T2* sequences not suggestive of cardiac hemochromatosis, ECV 50% in the septum.  - Echo (11/22): EF 30-35%, severe asymmetric septal hypertrophy, no LVOT gradient or mitral valve SAM, mild RV dilation with mildly decreased RV systolic function.   - PYP scan (11/22): grade 1, H/CL  0.95 (negative) 4. CKD stage 3 5. OSA: Uses CPAP 6. Type 2 diabetes 7. Hereditary hemochromatosis: Compound heterozygote with Cys282Tyr and His63Asp.   ROS: All systems negative except as listed in HPI, PMH and Problem List.  Social History   Socioeconomic History   Marital status: Single    Spouse name: Not on file   Number of children: 0   Years of education: Not on file   Highest education level: Not on file  Occupational History   Occupation: Sales  Tobacco Use   Smoking status: Never   Smokeless tobacco: Never  Vaping Use   Vaping Use: Never used  Substance and Sexual Activity   Alcohol use: Yes    Alcohol/week: 21.0 standard drinks    Types: 21 Cans of beer per week    Comment: previously quite heavy, working on cessation    Drug use: No   Sexual activity: Not on file  Other Topics Concern   Not on file  Social History Narrative   Lives in [place]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   Social Determinants of Health   Financial Resource Strain: Low Risk    Difficulty of Paying Living Expenses: Not hard at all  Food Insecurity: No Food Insecurity   Worried About Charity fundraiser in the Last Year: Never true   Arboriculturist in the Last Year: Never true  Transportation Needs: No Transportation Needs   Lack of Transportation (Medical): No   Lack of Transportation (Non-Medical): No  Physical Activity: Sufficiently Active   Days of Exercise per Week: 5 days   Minutes of Exercise per Session: 30 min  Stress: No Stress Concern Present   Feeling of Stress : Not at all  Social Connections: Unknown   Frequency of Communication with Friends and Family: More than three times a week   Frequency of Social Gatherings with Friends and Family: Twice a week   Attends Religious Services: Patient refused   Marine scientist or Organizations: Patient refused   Attends Music therapist: Patient refused   Marital Status: Never married  Human resources officer Violence:  Not At Risk   Fear of Current or Ex-Partner: No   Emotionally Abused: No   Physically Abused: No   Sexually Abused: No    FH:  Family History  Problem Relation Age of Onset   Hypertension Mother    Kidney failure Mother        Due to sepsis   COPD Father    Colon cancer Brother        dx in 41's   Esophageal cancer Neg Hx    Rectal cancer Neg Hx    Stomach cancer Neg Hx     Past Medical History:  Diagnosis Date   Asthma    as a teenager - does not use an inhaler although pt said he has an albuteral inhaler   Atrial fibrillation ([place])    persistant 02/2009   Atrial flutter ([place])    s/p CTI ablation 08/06/10   Cataract    Chronic rhinitis    Colon polyp    Congestive heart failure ([place])  Diverticulosis    colonoscopy 04/03/2009   Hyperlipidemia    Hypertension    Morbid obesity ([place])    target weight = 219  for BMI < 30   Nonischemic cardiomyopathy (HCC)    tachycardia mediated   Seasonal allergies    Sleep apnea    original 2001 - wears C-PaP   Type 2 diabetes mellitus (HCC)     Current Outpatient Medications  Medication Sig Dispense Refill   albuterol (PROVENTIL HFA;VENTOLIN HFA) 108 (90 Base) MCG/ACT inhaler Inhale 2 puffs into the lungs every 6 (six) hours as needed for wheezing or shortness of breath.     BD PEN NEEDLE NANO 2ND GEN 32G X 4 MM MISC USE 4 TIMES A DAY AS DIRECTED     BD PEN NEEDLE NANO 2ND GEN 32G X 4 MM MISC USE 4 TIMES A DAY AS DIRECTED 100 each 4   budesonide-formoterol (SYMBICORT) 80-4.5 MCG/ACT inhaler Inhale 2 puffs into the lungs as needed.     carvedilol (COREG) 12.5 MG tablet Take 1 tablet (12.5 mg total) by mouth 2 (two) times daily with a meal. 180 tablet 3   cholecalciferol (VITAMIN D3) 25 MCG (1000 UNIT) tablet Take 1,000 Units by mouth daily.     Continuous Blood Gluc Receiver (FREESTYLE LIBRE 14 DAY READER) DEVI USE AS DIRECTED 1 each 11   doxylamine, Sleep, (UNISOM) 25 MG tablet Take 50 mg by mouth at bedtime.     empagliflozin  (JARDIANCE) 10 MG TABS tablet Take 1 tablet (10 mg total) by mouth daily. 30 tablet 5   furosemide (LASIX) 20 MG tablet Take 0.5 tablets (10 mg total) by mouth 2 (two) times daily. 30 tablet 6   insulin aspart (NOVOLOG) 100 UNIT/ML FlexPen Inject 8-12 Units into the skin 3 (three) times daily with meals. 30 mL 3   Insulin Degludec (TRESIBA FLEXTOUCH [place]) Inject 32 Units into the skin daily.     levothyroxine (SYNTHROID) 50 MCG tablet TAKE 1 TABLET BY MOUTH EVERY DAY BEFORE BREAKFAST 90 tablet 2   mometasone (NASONEX) 50 MCG/ACT nasal spray Place 2 sprays into the nose daily as needed (allergies or rhinitis).     Multiple Vitamins-Minerals (MULTIVITAMIN WITH MINERALS) tablet Take 2 tablets by mouth at bedtime. Chewable     OZEMPIC, 0.25 OR 0.5 MG/DOSE, 2 MG/1.5ML SOPN INJECT 0.5 MG INTO THE SKIN ONCE A WEEK. 4.5 mL 1   sacubitril-valsartan (ENTRESTO) 97-103 MG Take 1 tablet by mouth 2 (two) times daily. 180 tablet 3   spironolactone (ALDACTONE) 25 MG tablet TAKE 1 TABLET BY MOUTH EVERY DAY 90 tablet 3   vitamin C (ASCORBIC ACID) 500 MG tablet Take 500 mg by mouth in the morning.     warfarin (COUMADIN) 5 MG tablet TAKE 1 TABLET TO 1 AND 1/2 TABLETS BY MOUTH DAILY AS DIRECTED BY THE COUMADIN CLINIC. 130 tablet 0   No current facility-administered medications for this encounter.    Vitals:   07/05/21 1513  BP: 100/70  Pulse: 67  SpO2: 96%  Weight: 118.2 kg (260 lb 9.6 oz)   Wt Readings from Last 3 Encounters:  07/05/21 118.2 kg (260 lb 9.6 oz)  06/21/21 120.3 kg (265 lb 3.2 oz)  05/21/21 117.7 kg (259 lb 6.4 oz)   PHYSICAL EXAM: General: NAD Neck: No JVD, no thyromegaly or thyroid nodule.  Lungs: Clear to auscultation bilaterally with normal respiratory effort. CV: Nondisplaced PMI.  Heart regular S1/S2, no S3/S4, no murmur.  No peripheral edema.  No carotid  bruit.  Normal pedal pulses.  Abdomen: Soft, nontender, no hepatosplenomegaly, no distention.  Skin: Intact without lesions or  rashes.  Neurologic: Alert and oriented x 3.  Psych: Normal affect. Extremities: No clubbing or cyanosis.  HEENT: Normal.   ASSESSMENT & PLAN: 1.Chronic systolic CHF: TEE 5/63 with EF <10%, severe RV dysfunction, LA appendage thrombus. Echoes in the past have had EF ranging 30-45%. Never had cath but Cardiolite in 10/20 showed no ischemia. Initially, thought to have tachycardia-mediated cardiomyopathy as he seems to become markedly more symptomatic when in atrial fibrillation.  Alternatively, he could have another cause for cardiomyopathy and atrial fibrillation just causes him to decompensate.  Echo in 7/22 in NSR showed EF 40-45% with normal RV, moderate MR (in NSR after atrial fibrillation ablation). He was found to be a compound heterozygote for hereditary hemochromatosis (evaluation done because of polycythemia), so I was concerned that he could have a hemochromatosis-related CMP.  However, cardiac MRI was done in 11/22, showing EF 27%, moderate asymmetric septal hypertrophy, diffuse hypokinesis worse in the septal wall, RV EF 30%, noncoronary LGE with diffuse mid-wall involvement especially in the septum, T2* sequences not suggestive of cardiac hemochromatosis, ECV 50% in the septum.  Cardiomyopathy appeared most consistent with hypertrophic cardiomyopathy (nonobstructive), less likely amyloidosis or sarcoidosis.  Myeloma panel negative and PYP scan not suggestive of TTR cardiac amyloidosis.  NYHA class I-II symptoms, not volume overloaded on exam.    - Continue Coreg 12.5 mg bid, no BP room to increase.  - Continue Entresto 97/103 bid. BMET today.  - Continue  jardiance 10 mg daily.  - Continue spironolactone 25 mg daily.   - I will send [employer] gene testing for hypertrophic cardiomyopathy and other familial cardiomyopathies (has not been done yet).  - He was offered CRT-D by EP but still not sure he wants a device, interestingly, QRS only 120 msec today so probably not CRT candidate.  He still  should get ICD with extensive scarring.  - I would like to do coronary angiography to fully rule out CAD/ischemic cardiomyopathy.  We discussed risks/benefits, and he agrees to [facility].  Will schedule (hold warfarin 3 days prior).  If significant revascularizable CAD is not present, he is willing to get ICD.  2. Atrial fibrillation: Paroxysmal. With LAA clots despite Xarelto and Pradaxa use, he was started on warfarin.  Will aim for INR 2.5-3.  AF ablation in 5/22.  He is in NSR today.  He is off amiodarone post-ablation.  - Continue warfarin.  3. CKD stage 3: BMET today.  4. OSA: Continue CPAP nightly.  5. Type 2 diabetes: Continue Jardiance 10 mg daily.  6. Hereditary hemochromatosis: Compound heterozygote.  T2* sequences on cardiac MRI were not suggestive of cardiac hemochromatosis.  - Therapeutic phlebotomy only if ferritin > 200 or transferrin saturation > 75%.   Followup in 3 months.   Loralie Champagne 07/07/2021

## 2021-07-19 ENCOUNTER — Other Ambulatory Visit: Payer: Self-pay | Admitting: Internal Medicine

## 2021-07-19 DIAGNOSIS — I480 Paroxysmal atrial fibrillation: Secondary | ICD-10-CM

## 2021-07-22 DIAGNOSIS — E1169 Type 2 diabetes mellitus with other specified complication: Secondary | ICD-10-CM | POA: Diagnosis not present

## 2021-07-31 ENCOUNTER — Telehealth: Payer: Self-pay | Admitting: Hematology

## 2021-07-31 NOTE — Telephone Encounter (Signed)
Left message with rescheduled upcoming appointment due to provider's PAL. 

## 2021-08-02 ENCOUNTER — Other Ambulatory Visit: Payer: Self-pay

## 2021-08-02 ENCOUNTER — Ambulatory Visit (INDEPENDENT_AMBULATORY_CARE_PROVIDER_SITE_OTHER): Payer: Medicare HMO

## 2021-08-02 ENCOUNTER — Ambulatory Visit: Payer: Medicare HMO | Admitting: Student

## 2021-08-02 VITALS — BP 112/78 | HR 71 | Ht 73.0 in | Wt 264.0 lb

## 2021-08-02 DIAGNOSIS — G4733 Obstructive sleep apnea (adult) (pediatric): Secondary | ICD-10-CM

## 2021-08-02 DIAGNOSIS — I4891 Unspecified atrial fibrillation: Secondary | ICD-10-CM

## 2021-08-02 DIAGNOSIS — I513 Intracardiac thrombosis, not elsewhere classified: Secondary | ICD-10-CM

## 2021-08-02 DIAGNOSIS — I5022 Chronic systolic (congestive) heart failure: Secondary | ICD-10-CM | POA: Diagnosis not present

## 2021-08-02 DIAGNOSIS — I428 Other cardiomyopathies: Secondary | ICD-10-CM

## 2021-08-02 LAB — POCT INR: INR: 2.7 (ref 2.0–3.0)

## 2021-08-02 NOTE — Patient Instructions (Signed)
Description   Continue taking 1.5 tablets daily except 1 tablet on Mondays and Fridays.  Recheck INR in 4 weeks. Amiodarone discontinued on 05/03/21.  Coumadin Clinic [phone].

## 2021-08-02 NOTE — Patient Instructions (Signed)
Medication Instructions:  Your physician recommends that you continue on your current medications as directed. Please refer to the Current Medication list given to you today.  *If you need a refill on your cardiac medications before your next appointment, please call your pharmacy*   Lab Work: None  If you have labs (blood work) drawn today and your tests are completely normal, you will receive your results only by: [place] (if you have MyChart) OR A paper copy in the mail If you have any lab test that is abnormal or we need to change your treatment, we will call you to review the results.   Follow-Up: At [facility], you and your health needs are our priority.  As part of our continuing mission to provide you with exceptional heart care, we have created designated Provider Care Teams.  These Care Teams include your primary Cardiologist (physician) and Advanced Practice Providers (APPs -  Physician Assistants and Nurse Practitioners) who all work together to provide you with the care you need, when you need it.  Your next appointment:   6-8 week(s)  The format for your next appointment:   In Person  Provider:   Cristopher Peru, MD

## 2021-08-02 NOTE — Progress Notes (Signed)
PCP:  [name], MD Primary Cardiologist: Buford Dresser, MD Electrophysiologist: Thompson Grayer, MD   [name] is a 66 y.o. male seen today for Thompson Grayer, MD for routine electrophysiology followup.  Since last being seen in our clinic the patient reports doing about the same.   Plan for cath with Dr. Aundra Dubin, if NICM confirmed, willing to proceed with ICD +/- CRT.  he denies chest pain, palpitations, dyspnea, PND, orthopnea, nausea, vomiting, dizziness, syncope, edema, weight gain, or early satiety.  Past Medical History:  Diagnosis Date   Asthma    as a teenager - does not use an inhaler although pt said he has an albuteral inhaler   Atrial fibrillation (HCC)    persistant 02/2009   Atrial flutter ([place])    s/p CTI ablation 08/06/10   Cataract    Chronic rhinitis    Colon polyp    Congestive heart failure ([place])    Diverticulosis    colonoscopy 04/03/2009   Hyperlipidemia    Hypertension    Morbid obesity ([place])    target weight = 219  for BMI < 30   Nonischemic cardiomyopathy (HCC)    tachycardia mediated   Seasonal allergies    Sleep apnea    original 2001 - wears C-PaP   Type 2 diabetes mellitus ([place])    Past Surgical History:  Procedure Laterality Date   ATRIAL FIBRILLATION ABLATION N/A 12/14/2018   Procedure: ATRIAL FIBRILLATION ABLATION;  Surgeon: Thompson Grayer, MD;  Location: [place] CV LAB;  Service: Cardiovascular;  Laterality: N/A;   ATRIAL FIBRILLATION ABLATION N/A 10/18/2020   Procedure: ATRIAL FIBRILLATION ABLATION;  Surgeon: Thompson Grayer, MD;  Location: [place] CV LAB;  Service: Cardiovascular;  Laterality: N/A;   atrial flutter ablation  08/06/10   CARDIOVERSION N/A 07/05/2018   Procedure: CARDIOVERSION;  Surgeon: Buford Dresser, MD;  [facility] END[facility]COPY;  Service: Cardiovascular;  Laterality: N/A;   CARDIOVERSION N/A 08/08/2020   Procedure: CARDIOVERSION;  Surgeon: Larey Dresser, MD;  Location: [facility] ENDOSCOPY;   Service: Cardiovascular;  Laterality: N/A;   CARDIOVERSION N/A 08/29/2020   Procedure: CARDIOVERSION;  Surgeon: Jolaine Artist, MD;  Location: [place];  Service: Cardiovascular;  Laterality: N/A;   COLONOSCOPY     POLYPECTOMY     RIGHT HEART CATH N/A 08/06/2020   Procedure: RIGHT HEART CATH;  Surgeon: Larey Dresser, MD;  Location: [place] CV LAB;  Service: Cardiovascular;  Laterality: N/A;   TEE WITHOUT CARDIOVERSION N/A 12/14/2018   Procedure: TRANSESOPHAGEAL ECHOCARDIOGRAM (TEE);  Surgeon: Thompson Grayer, MD;  Location: [place] CV LAB;  Service: Cardiovascular;  Laterality: N/A;   TEE WITHOUT CARDIOVERSION N/A 08/03/2020   Procedure: TRANSESOPHAGEAL ECHOCARDIOGRAM (TEE);  Surgeon: Sanda Klein, MD;  Location: [place];  Service: Cardiovascular;  Laterality: N/A;   TEE WITHOUT CARDIOVERSION N/A 08/08/2020   Procedure: TRANSESOPHAGEAL ECHOCARDIOGRAM (TEE);  Surgeon: Larey Dresser, MD;  Location: [facility] ENDOSCOPY;  Service: Cardiovascular;  Laterality: N/A;   TEE WITHOUT CARDIOVERSION N/A 10/18/2020   Procedure: TRANSESOPHAGEAL ECHOCARDIOGRAM (TEE);  Surgeon: Jerline Pain, MD;  Location: [facility] ENDOSCOPY;  Service: Cardiovascular;  Laterality: N/A;    Current Outpatient Medications  Medication Sig Dispense Refill   albuterol (PROVENTIL HFA;VENTOLIN HFA) 108 (90 Base) MCG/ACT inhaler Inhale 2 puffs into the lungs every 6 (six) hours as needed for wheezing or shortness of breath.     BD PEN NEEDLE NANO 2ND GEN 32G X 4 MM MISC USE 4 TIMES A DAY AS DIRECTED  BD PEN NEEDLE NANO 2ND GEN 32G X 4 MM MISC USE 4 TIMES A DAY AS DIRECTED 100 each 4   budesonide-formoterol (SYMBICORT) 80-4.5 MCG/ACT inhaler Inhale 2 puffs into the lungs as needed.     carvedilol (COREG) 12.5 MG tablet Take 1 tablet (12.5 mg total) by mouth 2 (two) times daily with a meal. 180 tablet 3   cholecalciferol (VITAMIN D3) 25 MCG (1000 UNIT) tablet Take 1,000 Units by mouth daily.     Continuous Blood Gluc  Receiver (FREESTYLE LIBRE 14 DAY READER) DEVI USE AS DIRECTED 1 each 11   doxylamine, Sleep, (UNISOM) 25 MG tablet Take 50 mg by mouth at bedtime.     empagliflozin (JARDIANCE) 10 MG TABS tablet Take 1 tablet (10 mg total) by mouth daily. 30 tablet 5   furosemide (LASIX) 20 MG tablet Take 0.5 tablets (10 mg total) by mouth 2 (two) times daily. 30 tablet 6   Insulin Degludec (TRESIBA FLEXTOUCH [place]) Inject 32 Units into the skin daily.     levothyroxine (SYNTHROID) 50 MCG tablet TAKE 1 TABLET BY MOUTH EVERY DAY BEFORE BREAKFAST 90 tablet 2   mometasone (NASONEX) 50 MCG/ACT nasal spray Place 2 sprays into the nose daily as needed (allergies or rhinitis).     Multiple Vitamins-Minerals (MULTIVITAMIN WITH MINERALS) tablet Take 2 tablets by mouth at bedtime. Chewable     NOVOLOG FLEXPEN 100 UNIT/ML FlexPen INJECT 8-12 UNITS INTO THE SKIN 3 (THREE) TIMES DAILY WITH MEALS. 30 mL 3   OZEMPIC, 0.25 OR 0.5 MG/DOSE, 2 MG/1.5ML SOPN INJECT 0.5 MG INTO THE SKIN ONCE A WEEK. 4.5 mL 1   sacubitril-valsartan (ENTRESTO) 97-103 MG Take 1 tablet by mouth 2 (two) times daily. 180 tablet 3   spironolactone (ALDACTONE) 25 MG tablet TAKE 1 TABLET BY MOUTH EVERY DAY 90 tablet 3   TRESIBA FLEXTOUCH 200 UNIT/ML FlexTouch Pen INJECT 66 UNITS INTO THE SKIN DAILY. 9 mL 3   vitamin C (ASCORBIC ACID) 500 MG tablet Take 500 mg by mouth in the morning.     warfarin (COUMADIN) 5 MG tablet TAKE 1 TABLET TO 1 AND 1/2 TABLETS BY MOUTH DAILY AS DIRECTED BY THE COUMADIN CLINIC. 130 tablet 0   No current facility-administered medications for this visit.    Not on File  Social History   Socioeconomic History   Marital status: Single    Spouse name: Not on file   Number of children: 0   Years of education: Not on file   Highest education level: Not on file  Occupational History   Occupation: Sales  Tobacco Use   Smoking status: Never   Smokeless tobacco: Never  Vaping Use   Vaping Use: Never used  Substance and Sexual  Activity   Alcohol use: Yes    Alcohol/week: 21.0 standard drinks    Types: 21 Cans of beer per week    Comment: previously quite heavy, working on cessation    Drug use: No   Sexual activity: Not on file  Other Topics Concern   Not on file  Social History Narrative   Lives in [place]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   Social Determinants of Health   Financial Resource Strain: Low Risk    Difficulty of Paying Living Expenses: Not hard at all  Food Insecurity: No Food Insecurity   Worried About Charity fundraiser in the Last Year: Never true   Arboriculturist in the Last Year: Never true  Transportation Needs: No Transportation Needs  Lack of Transportation (Medical): No   Lack of Transportation (Non-Medical): No  Physical Activity: Sufficiently Active   Days of Exercise per Week: 5 days   Minutes of Exercise per Session: 30 min  Stress: No Stress Concern Present   Feeling of Stress : Not at all  Social Connections: Unknown   Frequency of Communication with Friends and Family: More than three times a week   Frequency of Social Gatherings with Friends and Family: Twice a week   Attends Religious Services: Patient refused   Marine scientist or Organizations: Patient refused   Attends Music therapist: Patient refused   Marital Status: Never married  Human resources officer Violence: Not At Risk   Fear of Current or Ex-Partner: No   Emotionally Abused: No   Physically Abused: No   Sexually Abused: No     Review of Systems: All other systems reviewed and are otherwise negative except as noted above.  Physical Exam: Vitals:   08/02/21 1120  BP: 112/78  Pulse: 71  SpO2: 98%  Weight: 264 lb (119.7 kg)  Height: 6\' 1"  (1.854 m)    GEN- The patient is well appearing, alert and oriented x 3 today.   HEENT: normocephalic, atraumatic; sclera clear, conjunctiva pink; hearing intact; oropharynx clear; neck supple, no JVP Lymph- no cervical lymphadenopathy Lungs-  Clear to ausculation bilaterally, normal work of breathing.  No wheezes, rales, rhonchi Heart- Regular rate and rhythm, no murmurs, rubs or gallops, PMI not laterally displaced GI- soft, non-tender, non-distended, bowel sounds present, no hepatosplenomegaly Extremities- no clubbing, cyanosis, or edema; DP/PT/radial pulses 2+ bilaterally MS- no significant deformity or atrophy Skin- warm and dry, no rash or lesion Psych- euthymic mood, full affect Neuro- strength and sensation are intact  EKG is not ordered. Personal review of EKG from  07/05/21  shows NSR at 69 bpm  Additional studies reviewed include: Previous EP and HF office notes.   Assessment and Plan:  Persistent atrial fibrillation Maintaining sinus rhythm post ablation On coumadin for chads2vasc score of at least 3 stop amiodarone   2. Nonischemic CM EF 30-35% by echo 04/2021 The patient has a nonischemic CM (EF 30%), NYHA Class I CHF. He has an IVCD with QRS that has varied from 120 to >150 msec.   Previously offered BIV ICD and wished to defer.  Planning cath, and if no targets, he is willing to consider ICD per Dr. Oleh Genin notes.    3. OSA Encourage nightly CPAP advised   4. Obesity Body mass index is 34.83 kg/m.  Lifestyle modification advised    Follow up with Dr. Lovena Le in  6-8 weeks  to discuss ICD pending cath results.  His QRS has fluctuated between 120-150, so determination would need to be made at that time if indicated for CRT or not.   Shirley Friar, PA-C  08/02/21 11:33 AM

## 2021-08-06 ENCOUNTER — Telehealth: Payer: Self-pay | Admitting: Hematology

## 2021-08-06 NOTE — Telephone Encounter (Signed)
Rescheduled upcoming appointment due to overbooked provider. Patient is aware of changes.

## 2021-08-15 ENCOUNTER — Ambulatory Visit: Payer: Medicare HMO | Admitting: Hematology

## 2021-08-15 ENCOUNTER — Other Ambulatory Visit: Payer: Medicare HMO

## 2021-08-16 ENCOUNTER — Other Ambulatory Visit: Payer: Medicare HMO

## 2021-08-16 ENCOUNTER — Ambulatory Visit: Payer: Medicare HMO | Admitting: Hematology

## 2021-08-20 ENCOUNTER — Other Ambulatory Visit: Payer: Self-pay

## 2021-08-21 ENCOUNTER — Inpatient Hospital Stay: Payer: Medicare HMO | Attending: Hematology

## 2021-08-21 ENCOUNTER — Inpatient Hospital Stay: Payer: Medicare HMO | Admitting: Hematology

## 2021-08-21 ENCOUNTER — Other Ambulatory Visit: Payer: Self-pay

## 2021-08-21 DIAGNOSIS — E039 Hypothyroidism, unspecified: Secondary | ICD-10-CM | POA: Insufficient documentation

## 2021-08-21 DIAGNOSIS — Z79899 Other long term (current) drug therapy: Secondary | ICD-10-CM | POA: Insufficient documentation

## 2021-08-21 DIAGNOSIS — Z836 Family history of other diseases of the respiratory system: Secondary | ICD-10-CM | POA: Diagnosis not present

## 2021-08-21 DIAGNOSIS — Z8249 Family history of ischemic heart disease and other diseases of the circulatory system: Secondary | ICD-10-CM | POA: Insufficient documentation

## 2021-08-21 DIAGNOSIS — G4730 Sleep apnea, unspecified: Secondary | ICD-10-CM | POA: Insufficient documentation

## 2021-08-21 DIAGNOSIS — D751 Secondary polycythemia: Secondary | ICD-10-CM

## 2021-08-21 DIAGNOSIS — Z7289 Other problems related to lifestyle: Secondary | ICD-10-CM | POA: Insufficient documentation

## 2021-08-21 DIAGNOSIS — I10 Essential (primary) hypertension: Secondary | ICD-10-CM | POA: Diagnosis not present

## 2021-08-21 DIAGNOSIS — Z841 Family history of disorders of kidney and ureter: Secondary | ICD-10-CM | POA: Insufficient documentation

## 2021-08-21 DIAGNOSIS — Z800 Family history of malignant neoplasm of digestive organs: Secondary | ICD-10-CM | POA: Insufficient documentation

## 2021-08-21 LAB — CBC WITH DIFFERENTIAL (CANCER CENTER ONLY)
Abs Immature Granulocytes: 0.01 10*3/uL (ref 0.00–0.07)
Basophils Absolute: 0.1 10*3/uL (ref 0.0–0.1)
Basophils Relative: 1 %
Eosinophils Absolute: 0.2 10*3/uL (ref 0.0–0.5)
Eosinophils Relative: 4 %
HCT: 52.5 % — ABNORMAL HIGH (ref 39.0–52.0)
Hemoglobin: 18 g/dL — ABNORMAL HIGH (ref 13.0–17.0)
Immature Granulocytes: 0 %
Lymphocytes Relative: 20 %
Lymphs Abs: 1.1 10*3/uL (ref 0.7–4.0)
MCH: 30.7 pg (ref 26.0–34.0)
MCHC: 34.3 g/dL (ref 30.0–36.0)
MCV: 89.6 fL (ref 80.0–100.0)
Monocytes Absolute: 0.5 10*3/uL (ref 0.1–1.0)
Monocytes Relative: 9 %
Neutro Abs: 3.9 10*3/uL (ref 1.7–7.7)
Neutrophils Relative %: 66 %
Platelet Count: 176 10*3/uL (ref 150–400)
RBC: 5.86 MIL/uL — ABNORMAL HIGH (ref 4.22–5.81)
RDW: 14 % (ref 11.5–15.5)
WBC Count: 5.8 10*3/uL (ref 4.0–10.5)
nRBC: 0 % (ref 0.0–0.2)

## 2021-08-21 LAB — CMP (CANCER CENTER ONLY)
ALT: 22 U/L (ref 0–44)
AST: 27 U/L (ref 15–41)
Albumin: 3.7 g/dL (ref 3.5–5.0)
Alkaline Phosphatase: 64 U/L (ref 38–126)
Anion gap: 8 (ref 5–15)
BUN: 19 mg/dL (ref 8–23)
CO2: 31 mmol/L (ref 22–32)
Calcium: 8.8 mg/dL — ABNORMAL LOW (ref 8.9–10.3)
Chloride: 99 mmol/L (ref 98–111)
Creatinine: 1.4 mg/dL — ABNORMAL HIGH (ref 0.61–1.24)
GFR, Estimated: 55 mL/min — ABNORMAL LOW (ref >60)
Glucose, Bld: 89 mg/dL (ref 70–99)
Potassium: 3.5 mmol/L (ref 3.5–5.1)
Sodium: 138 mmol/L (ref 135–145)
Total Bilirubin: 0.8 mg/dL (ref 0.3–1.2)
Total Protein: 7.1 g/dL (ref 6.5–8.1)

## 2021-08-21 LAB — IRON AND IRON BINDING CAPACITY (CC-WL,HP ONLY)
Iron: 126 ug/dL (ref 45–182)
Saturation Ratios: 57 % — ABNORMAL HIGH (ref 17.9–39.5)
TIBC: 220 ug/dL — ABNORMAL LOW (ref 250–450)
UIBC: 94 ug/dL

## 2021-08-21 LAB — FERRITIN: Ferritin: 200 ng/mL (ref 24–336)

## 2021-08-22 ENCOUNTER — Telehealth: Payer: Self-pay | Admitting: Hematology

## 2021-08-22 NOTE — Telephone Encounter (Signed)
Scheduled follow-up appointment per 3/8 los. Patient is aware. ?

## 2021-08-28 NOTE — Progress Notes (Addendum)
. ? ? ?HEMATOLOGY/ONCOLOGY CLINIC VISIT NOTE ? ?Date of Service: 08/28/2021 ? ?Patient Care Team: ?[name], MD as PCP - General (Internal Medicine) ?Buford Dresser, MD as PCP - Cardiology (Cardiology) ?Thompson Grayer, MD as PCP - Electrophysiology (Cardiology) ? ?CHIEF COMPLAINTS/PURPOSE OF CONSULTATION:  ?Follow-up for hereditary hemochromatosis ? ?HISTORY OF PRESENTING ILLNESS:  ?Please see previous note for details of initial presentation ? ?INTERVAL HISTORY ?Patient is here for continued evaluation and management of his hereditary hemochromatosis. ?He notes no acute new symptoms since his last clinic visit. ?Notes that he has tried to cut down alcohol use. ?Labs done today were reviewed with him ?CBC shows secondary polycythemia with a hemoglobin of 18 and hematocrit of 52.5 with normal WBC count and platelets ?Ferritin 200 with an iron saturation of 57% ?CMP with stable chronic kidney disease creatinine of 1.4 otherwise unremarkable. ? ?MEDICAL HISTORY:  ?Past Medical History:  ?Diagnosis Date  ? Asthma   ? as a teenager - does not use an inhaler although pt said he has an albuteral inhaler  ? Atrial fibrillation ([place])   ? persistant 02/2009  ? Atrial flutter ([place])   ? s/p CTI ablation 08/06/10  ? Cataract   ? Chronic rhinitis   ? Colon polyp   ? Congestive heart failure ([place])   ? Diverticulosis   ? colonoscopy 04/03/2009  ? Hyperlipidemia   ? Hypertension   ? Morbid obesity ([place])   ? target weight = 219  for BMI < 30  ? Nonischemic cardiomyopathy ([place])   ? tachycardia mediated  ? Seasonal allergies   ? Sleep apnea   ? original 2001 - wears C-PaP  ? Type 2 diabetes mellitus ([place])   ? ? ?SURGICAL HISTORY: ?Past Surgical History:  ?Procedure Laterality Date  ? ATRIAL FIBRILLATION ABLATION N/A 12/14/2018  ? Procedure: ATRIAL FIBRILLATION ABLATION;  Surgeon: Thompson Grayer, MD;  Location: [place] [facility];  Service: Cardiovascular;  Laterality: N/A;  ? ATRIAL FIBRILLATION ABLATION N/A 10/18/2020  ?  Procedure: ATRIAL FIBRILLATION ABLATION;  Surgeon: Thompson Grayer, MD;  Location: [place] [facility];  Service: Cardiovascular;  Laterality: N/A;  ? atrial flutter ablation  08/06/10  ? CARDIOVERSION N/A 07/05/2018  ? Procedure: CARDIOVERSION;  Surgeon: Buford Dresser, MD;  Location: [place];  Service: Cardiovascular;  Laterality: N/A;  ? CARDIOVERSION N/A 08/08/2020  ? Procedure: CARDIOVERSION;  Surgeon: Larey Dresser, MD;  Location: [facility] ENDOSCOPY;  Service: Cardiovascular;  Laterality: N/A;  ? CARDIOVERSION N/A 08/29/2020  ? Procedure: CARDIOVERSION;  Surgeon: Jolaine Artist, MD;  Location: [facility] ENDOSCOPY;  Service: Cardiovascular;  Laterality: N/A;  ? COLONOSCOPY    ? POLYPECTOMY    ? RIGHT HEART CATH N/A 08/06/2020  ? Procedure: RIGHT HEART CATH;  Surgeon: Larey Dresser, MD;  Location: [place] [facility];  Service: Cardiovascular;  Laterality: N/A;  ? TEE WITHOUT CARDIOVERSION N/A 12/14/2018  ? Procedure: TRANSESOPHAGEAL ECHOCARDIOGRAM (TEE);  Surgeon: Thompson Grayer, MD;  Location: [place] [facility];  Service: Cardiovascular;  Laterality: N/A;  ? TEE WITHOUT CARDIOVERSION N/A 08/03/2020  ? Procedure: TRANSESOPHAGEAL ECHOCARDIOGRAM (TEE);  Surgeon: Sanda Klein, MD;  Location: [place];  Service: Cardiovascular;  Laterality: N/A;  ? TEE WITHOUT CARDIOVERSION N/A 08/08/2020  ? Procedure: TRANSESOPHAGEAL ECHOCARDIOGRAM (TEE);  Surgeon: Larey Dresser, MD;  Location: [facility] ENDOSCOPY;  Service: Cardiovascular;  Laterality: N/A;  ? TEE WITHOUT CARDIOVERSION N/A 10/18/2020  ? Procedure: TRANSESOPHAGEAL ECHOCARDIOGRAM (TEE);  Surgeon: Jerline Pain, MD;  Location: [facility] ENDOSCOPY;  Service: Cardiovascular;  Laterality: N/A;  ? ? ?SOCIAL HISTORY: ?Social History  ? ?Socioeconomic History  ? Marital status: Single  ?  Spouse name: Not on file  ? Number of children: 0  ? Years of education: Not on file  ? Highest education level: Not on file  ?Occupational History  ? Occupation: Sales  ?Tobacco Use  ?  Smoking status: Never  ? Smokeless tobacco: Never  ?Vaping Use  ? Vaping Use: Never used  ?Substance and Sexual Activity  ? Alcohol use: Yes  ?  Alcohol/week: 21.0 standard drinks  ?  Types: 21 Cans of beer per week  ?  Comment: previously quite heavy, working on cessation   ? Drug use: No  ? Sexual activity: Not on file  ?Other Topics Concern  ? Not on file  ?Social History Narrative  ? Lives in [place] and works for [name]  ? ?Social Determinants of Health  ? ?Financial Resource Strain: Low Risk   ? Difficulty of Paying Living Expenses: Not hard at all  ?Food Insecurity: No Food Insecurity  ? Worried About Charity fundraiser in the Last Year: Never true  ? Ran Out of Food in the Last Year: Never true  ?Transportation Needs: No Transportation Needs  ? Lack of Transportation (Medical): No  ? Lack of Transportation (Non-Medical): No  ?Physical Activity: Sufficiently Active  ? Days of Exercise per Week: 5 days  ? Minutes of Exercise per Session: 30 min  ?Stress: No Stress Concern Present  ? Feeling of Stress : Not at all  ?Social Connections: Unknown  ? Frequency of Communication with Friends and Family: More than three times a week  ? Frequency of Social Gatherings with Friends and Family: Twice a week  ? Attends Religious Services: Patient refused  ? Active Member of Clubs or Organizations: Patient refused  ? Attends Archivist Meetings: Patient refused  ? Marital Status: Never married  ?Intimate Partner Violence: Not At Risk  ? Fear of Current or Ex-Partner: No  ? Emotionally Abused: No  ? Physically Abused: No  ? Sexually Abused: No  ? ? ?FAMILY HISTORY: ?Family History  ?Problem Relation Age of Onset  ? Hypertension Mother   ? Kidney failure Mother   ?     Due to sepsis  ? COPD Father   ? Colon cancer Brother   ?     dx in 63's  ? Esophageal cancer Neg Hx   ? Rectal cancer Neg Hx   ? Stomach cancer Neg Hx   ? ? ?ALLERGIES:  has no allergies on file. ? ?MEDICATIONS:  ?Current Outpatient  Medications  ?Medication Sig Dispense Refill  ? albuterol (PROVENTIL HFA;VENTOLIN HFA) 108 (90 Base) MCG/ACT inhaler Inhale 2 puffs into the lungs every 6 (six) hours as needed for wheezing or shortness of breath.    ? BD PEN NEEDLE NANO 2ND GEN 32G X 4 MM MISC USE 4 TIMES A DAY AS DIRECTED    ? BD PEN NEEDLE NANO 2ND GEN 32G X 4 MM MISC USE 4 TIMES A DAY AS DIRECTED 100 each 4  ? budesonide-formoterol (SYMBICORT) 80-4.5 MCG/ACT inhaler Inhale 2 puffs into the lungs as needed.    ? carvedilol (COREG) 12.5 MG tablet Take 1 tablet (12.5 mg total) by mouth 2 (two) times daily with a meal. 180 tablet 3  ? cholecalciferol (VITAMIN D3) 25 MCG (1000 UNIT) tablet Take 1,000 Units by mouth daily.    ? Continuous Blood Gluc Receiver (FREESTYLE [place]  14 DAY READER) DEVI USE AS DIRECTED 1 each 11  ? doxylamine, Sleep, (UNISOM) 25 MG tablet Take 50 mg by mouth at bedtime.    ? empagliflozin (JARDIANCE) 10 MG TABS tablet Take 1 tablet (10 mg total) by mouth daily. 30 tablet 5  ? furosemide (LASIX) 20 MG tablet Take 0.5 tablets (10 mg total) by mouth 2 (two) times daily. 30 tablet 6  ? Insulin Degludec (TRESIBA FLEXTOUCH [place]) Inject 32 Units into the skin daily.    ? levothyroxine (SYNTHROID) 50 MCG tablet TAKE 1 TABLET BY MOUTH EVERY DAY BEFORE BREAKFAST 90 tablet 2  ? mometasone (NASONEX) 50 MCG/ACT nasal spray Place 2 sprays into the nose daily as needed (allergies or rhinitis).    ? Multiple Vitamins-Minerals (MULTIVITAMIN WITH MINERALS) tablet Take 2 tablets by mouth at bedtime. Chewable    ? NOVOLOG FLEXPEN 100 UNIT/ML FlexPen INJECT 8-12 UNITS INTO THE SKIN 3 (THREE) TIMES DAILY WITH MEALS. 30 mL 3  ? OZEMPIC, 0.25 OR 0.5 MG/DOSE, 2 MG/1.5ML SOPN INJECT 0.5 MG INTO THE SKIN ONCE A WEEK. 4.5 mL 1  ? sacubitril-valsartan (ENTRESTO) 97-103 MG Take 1 tablet by mouth 2 (two) times daily. 180 tablet 3  ? spironolactone (ALDACTONE) 25 MG tablet TAKE 1 TABLET BY MOUTH EVERY DAY 90 tablet 3  ? TRESIBA FLEXTOUCH 200 UNIT/ML FlexTouch  Pen INJECT 66 UNITS INTO THE SKIN DAILY. 9 mL 3  ? vitamin C (ASCORBIC ACID) 500 MG tablet Take 500 mg by mouth in the morning.    ? warfarin (COUMADIN) 5 MG tablet TAKE 1 TABLET TO 1 AND 1/2 TABLETS BY MOUTH DAILY

## 2021-08-30 ENCOUNTER — Other Ambulatory Visit: Payer: Self-pay

## 2021-08-30 ENCOUNTER — Ambulatory Visit (INDEPENDENT_AMBULATORY_CARE_PROVIDER_SITE_OTHER): Payer: Medicare HMO | Admitting: *Deleted

## 2021-08-30 DIAGNOSIS — I513 Intracardiac thrombosis, not elsewhere classified: Secondary | ICD-10-CM | POA: Diagnosis not present

## 2021-08-30 DIAGNOSIS — I4891 Unspecified atrial fibrillation: Secondary | ICD-10-CM | POA: Diagnosis not present

## 2021-08-30 DIAGNOSIS — Z5181 Encounter for therapeutic drug level monitoring: Secondary | ICD-10-CM

## 2021-08-30 LAB — POCT INR: INR: 5.9 — AB (ref 2.0–3.0)

## 2021-08-30 NOTE — Patient Instructions (Signed)
Description   ?Do not take any Warfarin today, No Warfarin Saturday, and No Warfarin Sunday then continue taking 1.5 tablets daily except 1 tablet on Mondays and Fridays.  Recheck INR in 1 week (normally 6 weeks). Amiodarone discontinued on 05/03/21.  Coumadin Clinic [phone].  ?  ?  ?

## 2021-09-06 ENCOUNTER — Other Ambulatory Visit: Payer: Self-pay

## 2021-09-06 ENCOUNTER — Ambulatory Visit (INDEPENDENT_AMBULATORY_CARE_PROVIDER_SITE_OTHER): Payer: Medicare HMO

## 2021-09-06 DIAGNOSIS — I4891 Unspecified atrial fibrillation: Secondary | ICD-10-CM | POA: Diagnosis not present

## 2021-09-06 DIAGNOSIS — I513 Intracardiac thrombosis, not elsewhere classified: Secondary | ICD-10-CM | POA: Diagnosis not present

## 2021-09-06 LAB — POCT INR: INR: 2.3 (ref 2.0–3.0)

## 2021-09-06 NOTE — Patient Instructions (Signed)
Description   ?Take 1.5 tablets today and then continue taking 1.5 tablets daily except 1 tablet on Mondays and Fridays.   ?Recheck INR in 2 weeks (normally 6 weeks).  ?Amiodarone discontinued on 05/03/21.  ?Coumadin Clinic [phone].  ?  ?   ?

## 2021-09-20 ENCOUNTER — Ambulatory Visit (INDEPENDENT_AMBULATORY_CARE_PROVIDER_SITE_OTHER): Payer: Medicare HMO

## 2021-09-20 DIAGNOSIS — I513 Intracardiac thrombosis, not elsewhere classified: Secondary | ICD-10-CM | POA: Diagnosis not present

## 2021-09-20 DIAGNOSIS — Z5181 Encounter for therapeutic drug level monitoring: Secondary | ICD-10-CM | POA: Diagnosis not present

## 2021-09-20 DIAGNOSIS — I4891 Unspecified atrial fibrillation: Secondary | ICD-10-CM | POA: Diagnosis not present

## 2021-09-20 LAB — POCT INR: INR: 3.4 — AB (ref 2.0–3.0)

## 2021-09-20 NOTE — Patient Instructions (Addendum)
-   have a serving of greens today ?- skip warfarin tonight, then  ?- continue taking 1.5 tablets daily except 1 tablet on Mondays and Fridays.   ?- Recheck INR in 4 weeks ?

## 2021-10-07 ENCOUNTER — Other Ambulatory Visit (HOSPITAL_COMMUNITY): Payer: Self-pay

## 2021-10-07 ENCOUNTER — Telehealth: Payer: Self-pay | Admitting: *Deleted

## 2021-10-07 ENCOUNTER — Ambulatory Visit (HOSPITAL_COMMUNITY)
Admission: RE | Admit: 2021-10-07 | Discharge: 2021-10-07 | Disposition: A | Discharge status: Home / Self Care | Payer: Medicare HMO | Source: Ambulatory Visit | Attending: Cardiology | Admitting: Cardiology

## 2021-10-07 VITALS — BP 121/70 | HR 65 | Wt 263.8 lb

## 2021-10-07 DIAGNOSIS — Z7984 Long term (current) use of oral hypoglycemic drugs: Secondary | ICD-10-CM | POA: Insufficient documentation

## 2021-10-07 DIAGNOSIS — I480 Paroxysmal atrial fibrillation: Secondary | ICD-10-CM | POA: Diagnosis not present

## 2021-10-07 DIAGNOSIS — G4733 Obstructive sleep apnea (adult) (pediatric): Secondary | ICD-10-CM | POA: Insufficient documentation

## 2021-10-07 DIAGNOSIS — I5022 Chronic systolic (congestive) heart failure: Secondary | ICD-10-CM

## 2021-10-07 DIAGNOSIS — I130 Hypertensive heart and chronic kidney disease with heart failure and stage 1 through stage 4 chronic kidney disease, or unspecified chronic kidney disease: Secondary | ICD-10-CM | POA: Insufficient documentation

## 2021-10-07 DIAGNOSIS — D751 Secondary polycythemia: Secondary | ICD-10-CM | POA: Diagnosis not present

## 2021-10-07 DIAGNOSIS — I428 Other cardiomyopathies: Secondary | ICD-10-CM | POA: Diagnosis not present

## 2021-10-07 DIAGNOSIS — N183 Chronic kidney disease, stage 3 unspecified: Secondary | ICD-10-CM | POA: Diagnosis not present

## 2021-10-07 DIAGNOSIS — Z79899 Other long term (current) drug therapy: Secondary | ICD-10-CM | POA: Diagnosis not present

## 2021-10-07 DIAGNOSIS — E1122 Type 2 diabetes mellitus with diabetic chronic kidney disease: Secondary | ICD-10-CM | POA: Diagnosis not present

## 2021-10-07 DIAGNOSIS — Z7901 Long term (current) use of anticoagulants: Secondary | ICD-10-CM | POA: Diagnosis not present

## 2021-10-07 LAB — BASIC METABOLIC PANEL
Anion gap: 7 (ref 5–15)
BUN: 19 mg/dL (ref 8–23)
CO2: 29 mmol/L (ref 22–32)
Calcium: 9.2 mg/dL (ref 8.9–10.3)
Chloride: 102 mmol/L (ref 98–111)
Creatinine, Ser: 1.43 mg/dL — ABNORMAL HIGH (ref 0.61–1.24)
GFR, Estimated: 54 mL/min — ABNORMAL LOW (ref >60)
Glucose, Bld: 100 mg/dL — ABNORMAL HIGH (ref 70–99)
Potassium: 4.1 mmol/L (ref 3.5–5.1)
Sodium: 138 mmol/L (ref 135–145)

## 2021-10-07 LAB — CBC
HCT: 55.8 % — ABNORMAL HIGH (ref 39.0–52.0)
Hemoglobin: 18.9 g/dL — ABNORMAL HIGH (ref 13.0–17.0)
MCH: 31 pg (ref 26.0–34.0)
MCHC: 33.9 g/dL (ref 30.0–36.0)
MCV: 91.6 fL (ref 80.0–100.0)
Platelets: 198 10*3/uL (ref 150–400)
RBC: 6.09 MIL/uL — ABNORMAL HIGH (ref 4.22–5.81)
RDW: 14.1 % (ref 11.5–15.5)
WBC: 7.4 10*3/uL (ref 4.0–10.5)
nRBC: 0 % (ref 0.0–0.2)

## 2021-10-07 LAB — BRAIN NATRIURETIC PEPTIDE: B Natriuretic Peptide: 160.8 pg/mL — ABNORMAL HIGH (ref 0.0–100.0)

## 2021-10-07 MED ORDER — CARVEDILOL 12.5 MG PO TABS: 18.7500 mg | ORAL_TABLET | Freq: Two times a day (BID) | ORAL | 3 refills | Status: DC

## 2021-10-07 NOTE — Patient Instructions (Addendum)
Medication Changes: ? ?Increase Carvedilol to 18.'75mg'$  Twice daily (1 1/2 Tab) ? ? ?Lab Work: ? ?Labs done today, your results will be available in MyChart, we will contact you for abnormal readings. ? ? ?Testing/Procedures: ? ? ?You are scheduled for a Cardiac Catheterization on Friday, May 19 with Dr. Loralie Champagne. ? ?1. Please arrive at the Main Entrance A at [facility]: [place], [place] [address] at 5:30 AM (This time is two hours before your procedure to ensure your preparation). Free valet parking service is available.  ? ?Special note: Every effort is made to have your procedure done on time. Please understand that emergencies sometimes delay scheduled procedures. ? ?2. Diet: Do not eat solid foods after midnight.  You may have clear liquids until 5 AM upon the day of the procedure. ? ?3. Labs: completed on 10/07/2021 ?4. Medication instructions in preparation for your procedure: ? ? Contrast Allergy: No ? ?The antiCoag Clinic direct you in how to manage your Warfarin prior to procedure  ? ?Stop taking, sprionolactone and Furosamide (Lasix)  Friday, May 19, ? ?Take only 4 units of insulin the night before your procedure. Do not take any insulin on the day of the procedure. ? ? ? ?On the morning of your procedure, take any morning medicines NOT listed above.  You may use sips of water. ? ?5. Plan to go home the same day, you will only stay overnight if medically necessary. ?6. You MUST have a responsible adult to drive you home. ?7. An adult MUST be with you the first 24 hours after you arrive home. ?8. Bring a current list of your medications, and the last time and date medication taken. ?9. Bring ID and current insurance cards. ?10.Please wear clothes that are easy to get on and off and wear slip-on shoes. ? ?Thank you for allowing [place] to care for you! ?  -- [place] [facility] ? ? ?Referrals: ? ?Sr. Nira Conn for Genetic Counseling  ? ?Special Instructions  // Education: ? ?none ? ?Follow-Up in: as scheduled  ? ?At the [facility], you and your health needs are our priority. We have a designated team specialized in the treatment of Heart Failure. This Care Team includes your primary Heart Failure Specialized Cardiologist (physician), Advanced Practice Providers (APPs- Physician Assistants and Nurse Practitioners), and Pharmacist who all work together to provide you with the care you need, when you need it.  ? ?You may see any of the following providers on your designated Care Team at your next follow up: ? ?Dr Glori Bickers ?Dr Loralie Champagne ?Darrick Grinder, NP ?Lyda Jester, PA ?Jessica Milford,NP ?Marlyce Huge, PA ?Audry Riles, PharmD ? ? ?Please be sure to bring in all your medications bottles to every appointment.  ? ?Need to Contact us: ? ?If you have any questions or concerns before your next appointment please send [place] a message through [place] or call our office at [phone].   ? ?TO LEAVE A MESSAGE FOR THE NURSE SELECT OPTION 2, PLEASE LEAVE A MESSAGE INCLUDING: ?YOUR NAME ?DATE OF BIRTH ?CALL BACK NUMBER ?REASON FOR CALL**this is important as we prioritize the call backs ? ?YOU WILL RECEIVE A CALL BACK THE SAME DAY AS LONG AS YOU CALL BEFORE 4:00 PM ? ? ?

## 2021-10-07 NOTE — Telephone Encounter (Signed)
Received the above message that the pt will be having a Cath on 11/01/2021 and needs Lovenox Bridge; can you please clear pt for procedure clearance. Thanks in advance.  ?

## 2021-10-07 NOTE — Telephone Encounter (Signed)
-----   Message from Jerl Mina, RN sent at 10/07/2021  2:34 PM EDT ----- ?Has a cath on 11/01/2021. Dr. Aundra Dubin wants him to get a lovenox bridge prior to Cath  ? ?

## 2021-10-08 NOTE — Progress Notes (Signed)
? ?PCP:  Dr Sharlet Salina  ?Primary Cardiologist: Dr Harrell Gave ?HF MD: Dr Aundra Dubin  ?EP: Dr Rayann Heman  ? ?HPI: ?66 y.o. male w/ h/o cardiomyopathy, presumed to be tachy-mediated from atrial fibrillation/atrial flutter.  Paroxsymal Afib/flutter dates back to ~2012. Had Aflutter ablation in 2012. He had several echos between 2012-2018 showing normal LVEF. Found to have reduced EF on echo in 04/2018, down to 30-35%. Was in afib at the time. 11/2018 was referred for Afib ablation but procedure was aborted after TEE showed LA thrombus, despite being on Xarelto. Anticoagulation changed to Pradaxa. He ultimately spontaneously converted back to NSR. Repeat echo 10/20 showed EF 35-40%.  Plans were made for [facility] 10/20 however had asymptomatic positive COVID test, then renal failure. Ischemic eval changed to [employer] which showed no ischemia in 10/20. Echo repeated 3/21, EF 40-45%, RV normal.  ?  ?Seen in cardiology clinic, back in atrial fibrillation with RVR.  Presented for outpatient DCCV 08/03/20. TEE showed LA thrombus. DCCV canceled. EF lower on TEE ~5-10% with RV severely reduced. Admitted and started on IV Lasix and PICC line was placed. Milrinone started for low output, initial co-ox 35%. Pradaxa stopped and he was placed on coumadin w/ heparin bridge. Placed on IV amio for afib. He had repeat TEE on 08/07/20 that showed no LAA thrombus so DCCV was done successfully. Transitioned to PO amio 200 mg bid. Remained in NSR. Diuresed well w/ IV Lasix and able to wean of milrinone. Discharge weight 264 pounds.  ? ?He went back into atrial fibrillation in 3/22, had another DCCV back to NSR.  He had a TEE in 5/22 showing EF 30-35%, then had atrial fibrillation ablation.   ? ?Echo in 7/22 showed showed EF 40-45% with normal RV and moderate MR.  ? ?Patient has polycythemia and was noted to be a compound heterozygote for hereditary hemochromatosis.  Cardiac MRI was done in 11/22 showing EF 27%, moderate asymmetric septal  hypertrophy, diffuse hypokinesis worse in the septal wall, RV EF 30%, noncoronary LGE with diffuse mid-wall involvement especially in the septum, T2* sequences not suggestive of cardiac hemochromatosis, ECV 50% in the septum.  Echo in 11/22 showed EF 30-35%, severe asymmetric septal hypertrophy, no LVOT gradient or mitral valve SAM, mild RV dilation with mildly decreased RV systolic function.   ? ?Patient saw hematology, will not get therapeutic phlebotomy as long as ferritin < 200 and transferrin saturation < 75%.  ? ?PYP scan in 11/22 was not suggestive of TTR cardiac amyloidosis.  Genetic testing showed that patient was a heterozygote for MYH7 gene mutation that has been linked to hypertrophic cardiomyopathy.  ? ?He returns today for followup of CHF.  He is in NSR.  Rarely drinking ETOH (prior heavy).  No significant exertional dyspnea.  He plays golf without problems.  No chest pain.  No lightheadedness.  No palpitations.  ? ?Labs (5/22): K 4.1, creatinine 1.49 ?Labs (9/22): hgb 20.3/HCT 60.1, K 4.9, creatinine 1.46, ferritin 116 ?Labs (11/22): Negative myeloma panel.  K 4.6, creatinine 1.27, LFTs normal, TSH normal.  ?Labs (3/23): K 3.5, creatinine 1.4, hgb 18 ? ?ECG (personally reviewed): NSR, IVCD 134 msec ? ?PMH: ?1. Atrial flutter: Ablation 2012.  ?2. Atrial fibrillation: H/o LAA thrombus on both Xarelto and Pradaxa.  DCCV 2/22, 3/22. Atrial fibrillation ablation in 5/22.  ?3. Chronic systolic CHF: ?tachycardia-mediated CMP with AF/RVR.  ?- Cardiolite (10/20): No ischemia.  ?- TEE (2/22): EF 5-10% with severe RV dysfunction.  ?- Echo (7/22): EF 40-45%, normal RV,  moderate MR.  ?- Cardiac MRI (11/22): EF 27%, moderate asymmetric septal hypertrophy, diffuse hypokinesis worse in the septal wall, RV EF 30%, noncoronary LGE with diffuse mid-wall involvement especially in the septum, T2* sequences not suggestive of cardiac hemochromatosis, ECV 50% in the septum.  ?- Echo (11/22): EF 30-35%, severe asymmetric  septal hypertrophy, no LVOT gradient or mitral valve SAM, mild RV dilation with mildly decreased RV systolic function.   ?- PYP scan (11/22): grade 1, H/CL 0.95 (negative) ?- Genetic testing showed that patient was a heterozygote for MYH7 gene mutation that has been linked to hypertrophic cardiomyopathy.  ?4. CKD stage 3 ?5. OSA: Uses CPAP ?6. Type 2 diabetes ?7. Hereditary hemochromatosis: Compound heterozygote with Cys282Tyr and His63Asp.  ? ?ROS: All systems negative except as listed in HPI, PMH and Problem List. ? ?Social History  ? ?Socioeconomic History  ? Marital status: Single  ?  Spouse name: Not on file  ? Number of children: 0  ? Years of education: Not on file  ? Highest education level: Not on file  ?Occupational History  ? Occupation: Sales  ?Tobacco Use  ? Smoking status: Never  ? Smokeless tobacco: Never  ?Vaping Use  ? Vaping Use: Never used  ?Substance and Sexual Activity  ? Alcohol use: Yes  ?  Alcohol/week: 21.0 standard drinks  ?  Types: 21 Cans of beer per week  ?  Comment: previously quite heavy, working on cessation   ? Drug use: No  ? Sexual activity: Not on file  ?Other Topics Concern  ? Not on file  ?Social History Narrative  ? Lives in [place] and works for [name]  ? ?Social Determinants of Health  ? ?Financial Resource Strain: Low Risk   ? Difficulty of Paying Living Expenses: Not hard at all  ?Food Insecurity: No Food Insecurity  ? Worried About Charity fundraiser in the Last Year: Never true  ? Ran Out of Food in the Last Year: Never true  ?Transportation Needs: No Transportation Needs  ? Lack of Transportation (Medical): No  ? Lack of Transportation (Non-Medical): No  ?Physical Activity: Sufficiently Active  ? Days of Exercise per Week: 5 days  ? Minutes of Exercise per Session: 30 min  ?Stress: No Stress Concern Present  ? Feeling of Stress : Not at all  ?Social Connections: Unknown  ? Frequency of Communication with Friends and Family: More than three times a week  ?  Frequency of Social Gatherings with Friends and Family: Twice a week  ? Attends Religious Services: Patient refused  ? Active Member of Clubs or Organizations: Patient refused  ? Attends Archivist Meetings: Patient refused  ? Marital Status: Never married  ?Intimate Partner Violence: Not At Risk  ? Fear of Current or Ex-Partner: No  ? Emotionally Abused: No  ? Physically Abused: No  ? Sexually Abused: No  ? ? ?FH:  ?Family History  ?Problem Relation Age of Onset  ? Hypertension Mother   ? Kidney failure Mother   ?     Due to sepsis  ? COPD Father   ? Colon cancer Brother   ?     dx in 53's  ? Esophageal cancer Neg Hx   ? Rectal cancer Neg Hx   ? Stomach cancer Neg Hx   ? ? ?Past Medical History:  ?Diagnosis Date  ? Asthma   ? as a teenager - does not use an inhaler although pt said he has an albuteral inhaler  ?  Atrial fibrillation ([place])   ? persistant 02/2009  ? Atrial flutter ([place])   ? s/p CTI ablation 08/06/10  ? Cataract   ? Chronic rhinitis   ? Colon polyp   ? Congestive heart failure ([place])   ? Diverticulosis   ? colonoscopy 04/03/2009  ? Hyperlipidemia   ? Hypertension   ? Morbid obesity ([place])   ? target weight = 219  for BMI < 30  ? Nonischemic cardiomyopathy ([place])   ? tachycardia mediated  ? Seasonal allergies   ? Sleep apnea   ? original 2001 - wears C-PaP  ? Type 2 diabetes mellitus ([place])   ? ? ?Current Outpatient Medications  ?Medication Sig Dispense Refill  ? albuterol (PROVENTIL HFA;VENTOLIN HFA) 108 (90 Base) MCG/ACT inhaler Inhale 2 puffs into the lungs every 6 (six) hours as needed for wheezing or shortness of breath.    ? BD PEN NEEDLE NANO 2ND GEN 32G X 4 MM MISC USE 4 TIMES A DAY AS DIRECTED    ? BD PEN NEEDLE NANO 2ND GEN 32G X 4 MM MISC USE 4 TIMES A DAY AS DIRECTED 100 each 4  ? budesonide-formoterol (SYMBICORT) 80-4.5 MCG/ACT inhaler Inhale 2 puffs into the lungs as needed.    ? cholecalciferol (VITAMIN D3) 25 MCG (1000 UNIT) tablet Take 1,000 Units by mouth daily.    ? Continuous  Blood Gluc Receiver (FREESTYLE [place] 14 DAY READER) DEVI USE AS DIRECTED 1 each 11  ? doxylamine, Sleep, (UNISOM) 25 MG tablet Take 50 mg by mouth at bedtime.    ? empagliflozin (JARDIANCE) 10 MG TABS tablet Take 1 tabl

## 2021-10-08 NOTE — H&P (View-Only) (Signed)
PCP:  Dr Sharlet Salina  Primary Cardiologist: Dr Harrell Gave HF MD: Dr Aundra Dubin  EP: Dr Rayann Heman   HPI: 66 y.o. male w/ h/o cardiomyopathy, presumed to be tachy-mediated from atrial fibrillation/atrial flutter.  Paroxsymal Afib/flutter dates back to ~2012. Had Aflutter ablation in 2012. He had several echos between 2012-2018 showing normal LVEF. Found to have reduced EF on echo in 04/2018, down to 30-35%. Was in afib at the time. 11/2018 was referred for Afib ablation but procedure was aborted after TEE showed LA thrombus, despite being on Xarelto. Anticoagulation changed to Pradaxa. He ultimately spontaneously converted back to NSR. Repeat echo 10/20 showed EF 35-40%.  Plans were made for [facility] 10/20 however had asymptomatic positive COVID test, then renal failure. Ischemic eval changed to [employer] which showed no ischemia in 10/20. Echo repeated 3/21, EF 40-45%, RV normal.    Seen in cardiology clinic, back in atrial fibrillation with RVR.  Presented for outpatient DCCV 08/03/20. TEE showed LA thrombus. DCCV canceled. EF lower on TEE ~5-10% with RV severely reduced. Admitted and started on IV Lasix and PICC line was placed. Milrinone started for low output, initial co-ox 35%. Pradaxa stopped and he was placed on coumadin w/ heparin bridge. Placed on IV amio for afib. He had repeat TEE on 08/07/20 that showed no LAA thrombus so DCCV was done successfully. Transitioned to PO amio 200 mg bid. Remained in NSR. Diuresed well w/ IV Lasix and able to wean of milrinone. Discharge weight 264 pounds.   He went back into atrial fibrillation in 3/22, had another DCCV back to NSR.  He had a TEE in 5/22 showing EF 30-35%, then had atrial fibrillation ablation.    Echo in 7/22 showed showed EF 40-45% with normal RV and moderate MR.   Patient has polycythemia and was noted to be a compound heterozygote for hereditary hemochromatosis.  Cardiac MRI was done in 11/22 showing EF 27%, moderate asymmetric septal  hypertrophy, diffuse hypokinesis worse in the septal wall, RV EF 30%, noncoronary LGE with diffuse mid-wall involvement especially in the septum, T2* sequences not suggestive of cardiac hemochromatosis, ECV 50% in the septum.  Echo in 11/22 showed EF 30-35%, severe asymmetric septal hypertrophy, no LVOT gradient or mitral valve SAM, mild RV dilation with mildly decreased RV systolic function.    Patient saw hematology, will not get therapeutic phlebotomy as long as ferritin < 200 and transferrin saturation < 75%.   PYP scan in 11/22 was not suggestive of TTR cardiac amyloidosis.  Genetic testing showed that patient was a heterozygote for MYH7 gene mutation that has been linked to hypertrophic cardiomyopathy.   He returns today for followup of CHF.  He is in NSR.  Rarely drinking ETOH (prior heavy).  No significant exertional dyspnea.  He plays golf without problems.  No chest pain.  No lightheadedness.  No palpitations.   Labs (5/22): K 4.1, creatinine 1.49 Labs (9/22): hgb 20.3/HCT 60.1, K 4.9, creatinine 1.46, ferritin 116 Labs (11/22): Negative myeloma panel.  K 4.6, creatinine 1.27, LFTs normal, TSH normal.  Labs (3/23): K 3.5, creatinine 1.4, hgb 18  ECG (personally reviewed): NSR, IVCD 134 msec  PMH: 1. Atrial flutter: Ablation 2012.  2. Atrial fibrillation: H/o LAA thrombus on both Xarelto and Pradaxa.  DCCV 2/22, 3/22. Atrial fibrillation ablation in 5/22.  3. Chronic systolic CHF: ?tachycardia-mediated CMP with AF/RVR.  - Cardiolite (10/20): No ischemia.  - TEE (2/22): EF 5-10% with severe RV dysfunction.  - Echo (7/22): EF 40-45%, normal RV,  moderate MR.  - Cardiac MRI (11/22): EF 27%, moderate asymmetric septal hypertrophy, diffuse hypokinesis worse in the septal wall, RV EF 30%, noncoronary LGE with diffuse mid-wall involvement especially in the septum, T2* sequences not suggestive of cardiac hemochromatosis, ECV 50% in the septum.  - Echo (11/22): EF 30-35%, severe asymmetric  septal hypertrophy, no LVOT gradient or mitral valve SAM, mild RV dilation with mildly decreased RV systolic function.   - PYP scan (11/22): grade 1, H/CL 0.95 (negative) - Genetic testing showed that patient was a heterozygote for MYH7 gene mutation that has been linked to hypertrophic cardiomyopathy.  4. CKD stage 3 5. OSA: Uses CPAP 6. Type 2 diabetes 7. Hereditary hemochromatosis: Compound heterozygote with Cys282Tyr and His63Asp.   ROS: All systems negative except as listed in HPI, PMH and Problem List.  Social History   Socioeconomic History   Marital status: Single    Spouse name: Not on file   Number of children: 0   Years of education: Not on file   Highest education level: Not on file  Occupational History   Occupation: Sales  Tobacco Use   Smoking status: Never   Smokeless tobacco: Never  Vaping Use   Vaping Use: Never used  Substance and Sexual Activity   Alcohol use: Yes    Alcohol/week: 21.0 standard drinks    Types: 21 Cans of beer per week    Comment: previously quite heavy, working on cessation    Drug use: No   Sexual activity: Not on file  Other Topics Concern   Not on file  Social History Narrative   Lives in [place]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   Social Determinants of Health   Financial Resource Strain: Low Risk    Difficulty of Paying Living Expenses: Not hard at all  Food Insecurity: No Food Insecurity   Worried About Charity fundraiser in the Last Year: Never true   Arboriculturist in the Last Year: Never true  Transportation Needs: No Transportation Needs   Lack of Transportation (Medical): No   Lack of Transportation (Non-Medical): No  Physical Activity: Sufficiently Active   Days of Exercise per Week: 5 days   Minutes of Exercise per Session: 30 min  Stress: No Stress Concern Present   Feeling of Stress : Not at all  Social Connections: Unknown   Frequency of Communication with Friends and Family: More than three times a week    Frequency of Social Gatherings with Friends and Family: Twice a week   Attends Religious Services: Patient refused   Printmaker: Patient refused   Attends Music therapist: Patient refused   Marital Status: Never married  Human resources officer Violence: Not At Risk   Fear of Current or Ex-Partner: No   Emotionally Abused: No   Physically Abused: No   Sexually Abused: No    FH:  Family History  Problem Relation Age of Onset   Hypertension Mother    Kidney failure Mother        Due to sepsis   COPD Father    Colon cancer Brother        dx in 39's   Esophageal cancer Neg Hx    Rectal cancer Neg Hx    Stomach cancer Neg Hx     Past Medical History:  Diagnosis Date   Asthma    as a teenager - does not use an inhaler although pt said he has an albuteral inhaler  Atrial fibrillation ([place])    persistant 02/2009   Atrial flutter ([place])    s/p CTI ablation 08/06/10   Cataract    Chronic rhinitis    Colon polyp    Congestive heart failure ([place])    Diverticulosis    colonoscopy 04/03/2009   Hyperlipidemia    Hypertension    Morbid obesity ([place])    target weight = 219  for BMI < 30   Nonischemic cardiomyopathy ([facility])    tachycardia mediated   Seasonal allergies    Sleep apnea    original 2001 - wears C-PaP   Type 2 diabetes mellitus (H[facility])     Current Outpatient Medications  Medication Sig Dispense Refill   albuterol (PROVENTIL HFA;VENTOLIN HFA) 108 (90 Base) MCG/ACT inhaler Inhale 2 puffs into the lungs every 6 (six) hours as needed for wheezing or shortness of breath.     BD PEN NEEDLE NANO 2ND GEN 32G X 4 MM MISC USE 4 TIMES A DAY AS DIRECTED     BD PEN NEEDLE NANO 2ND GEN 32G X 4 MM MISC USE 4 TIMES A DAY AS DIRECTED 100 each 4   budesonide-formoterol (SYMBICORT) 80-4.5 MCG/ACT inhaler Inhale 2 puffs into the lungs as needed.     cholecalciferol (VITAMIN D3) 25 MCG (1000 UNIT) tablet Take 1,000 Units by mouth daily.     Continuous  Blood Gluc Receiver (FREESTYLE LIBRE 14 DAY READER) DEVI USE AS DIRECTED 1 each 11   doxylamine, Sleep, (UNISOM) 25 MG tablet Take 50 mg by mouth at bedtime.     empagliflozin (JARDIANCE) 10 MG TABS tablet Take 1 tablet (10 mg total) by mouth daily. 30 tablet 5   furosemide (LASIX) 20 MG tablet Take 0.5 tablets (10 mg total) by mouth 2 (two) times daily. 30 tablet 6   Insulin Degludec (TRESIBA FLEXTOUCH [place]) Inject 32 Units into the skin daily.     levothyroxine (SYNTHROID) 50 MCG tablet TAKE 1 TABLET BY MOUTH EVERY DAY BEFORE BREAKFAST 90 tablet 2   mometasone (NASONEX) 50 MCG/ACT nasal spray Place 2 sprays into the nose daily as needed (allergies or rhinitis).     Multiple Vitamins-Minerals (MULTIVITAMIN WITH MINERALS) tablet Take 2 tablets by mouth at bedtime. Chewable     NOVOLOG FLEXPEN 100 UNIT/ML FlexPen INJECT 8-12 UNITS INTO THE SKIN 3 (THREE) TIMES DAILY WITH MEALS. 30 mL 3   OZEMPIC, 0.25 OR 0.5 MG/DOSE, 2 MG/1.5ML SOPN INJECT 0.5 MG INTO THE SKIN ONCE A WEEK. 4.5 mL 1   sacubitril-valsartan (ENTRESTO) 97-103 MG Take 1 tablet by mouth 2 (two) times daily. 180 tablet 3   spironolactone (ALDACTONE) 25 MG tablet TAKE 1 TABLET BY MOUTH EVERY DAY 90 tablet 3   vitamin C (ASCORBIC ACID) 500 MG tablet Take 500 mg by mouth in the morning.     warfarin (COUMADIN) 5 MG tablet TAKE 1 TABLET TO 1 AND 1/2 TABLETS BY MOUTH DAILY AS DIRECTED BY THE COUMADIN CLINIC. 130 tablet 0   carvedilol (COREG) 12.5 MG tablet Take 1.5 tablets (18.75 mg total) by mouth 2 (two) times daily with a meal. 270 tablet 3   No current facility-administered medications for this encounter.    Vitals:   10/07/21 1351  BP: 121/70  Pulse: 65  SpO2: 96%  Weight: 119.7 kg (263 lb 12.8 oz)   Wt Readings from Last 3 Encounters:  10/07/21 119.7 kg (263 lb 12.8 oz)  08/21/21 120.2 kg (265 lb 1.6 oz)  08/02/21 119.7 kg (264 lb)  PHYSICAL EXAM: General: NAD Neck: No JVD, no thyromegaly or thyroid nodule.  Lungs: Clear to  auscultation bilaterally with normal respiratory effort. CV: Nondisplaced PMI.  Heart regular S1/S2, no S3/S4, no murmur.  No peripheral edema.  No carotid bruit.  Normal pedal pulses.  Abdomen: Soft, nontender, no hepatosplenomegaly, no distention.  Skin: Intact without lesions or rashes.  Neurologic: Alert and oriented x 3.  Psych: Normal affect. Extremities: No clubbing or cyanosis.  HEENT: Normal.   ASSESSMENT & PLAN: 1.Chronic systolic CHF: TEE 4/43 with EF <10%, severe RV dysfunction, LA appendage thrombus. Echoes in the past have had EF ranging 30-45%. Never had cath but Cardiolite in 10/20 showed no ischemia. Initially, thought to have tachycardia-mediated cardiomyopathy as he seems to become markedly more symptomatic when in atrial fibrillation.  Alternatively, he could have another cause for cardiomyopathy and atrial fibrillation just causes him to decompensate.  Echo in 7/22 in NSR showed EF 40-45% with normal RV, moderate MR (in NSR after atrial fibrillation ablation). He was found to be a compound heterozygote for hereditary hemochromatosis (evaluation done because of polycythemia), so I was concerned that he could have a hemochromatosis-related CMP.  However, cardiac MRI was done in 11/22, showing EF 27%, moderate asymmetric septal hypertrophy, diffuse hypokinesis worse in the septal wall, RV EF 30%, noncoronary LGE with diffuse mid-wall involvement especially in the septum, T2* sequences not suggestive of cardiac hemochromatosis, ECV 50% in the septum.  Cardiomyopathy appeared most consistent with hypertrophic cardiomyopathy (nonobstructive), less likely amyloidosis or sarcoidosis.  Myeloma panel negative and PYP scan not suggestive of TTR cardiac amyloidosis.  Genetic testing showed that patient was a heterozygote for MYH7 gene mutation that has been linked to hypertrophic cardiomyopathy => I suspect that HCM is the diagnosis here.  NYHA class I-II symptoms, not volume overloaded on exam.     - Increase Coreg to 18.75 mg bid.  - Continue Entresto 97/103 bid. BMET today.  - Continue  Jardiance 10 mg daily.  - Continue spironolactone 25 mg daily.   - I think he should get ICD with extensive scarring on MRI and persistent low EF.  QRS 134 msec today, probably not wide enough for CRT. I will refer him back to EP after cath.  - I would like to do coronary angiography to fully rule out CAD/ischemic cardiomyopathy.  We discussed risks/benefits, and he agrees to [facility].  Will schedule (Lovenox bridge off warfarin).  He did not get this done after last appointment as he did not have a ride.  Now able to get ride and ready to set up. If significant revascularizable CAD is not present, he is willing to get ICD.  - Refer to Lattie Corns for genetics counseling in the setting of gene+ HCM suspected (heterozygote for MYH7 gene mutation that has been linked to hypertrophic cardiomyopathy).  2. Atrial fibrillation: Paroxysmal. With LAA clots despite Xarelto and Pradaxa use, he was started on warfarin.  Will aim for INR 2.5-3.  AF ablation in 5/22.  He is in NSR today.  He is off amiodarone post-ablation.  - Continue warfarin.  3. CKD stage 3: BMET today.  4. OSA: Continue CPAP nightly.  5. Type 2 diabetes: Continue Jardiance 10 mg daily.  6. Hereditary hemochromatosis: Compound heterozygote.  T2* sequences on cardiac MRI were not suggestive of cardiac hemochromatosis.  - Therapeutic phlebotomy only if ferritin > 200 or transferrin saturation > 75%.   Followup 2 wks post-cath.   Loralie Champagne 10/08/2021

## 2021-10-08 NOTE — Telephone Encounter (Signed)
Patient with diagnosis of afib and LAA thrombus on warfarin for anticoagulation.   ? ?Procedure: right/left heart cath ?Date of procedure: 11/01/20 ? ? ?CrCl 86 ml/min ?Platelet count 189 ? ?Per office protocol, patient can hold warfarin for 5 days prior to procedure.   ? ?Patient WILL need bridging with Lovenox (enoxaparin) around procedure. ? ?Coumadin apt moved to 5/5 for bridge ? ?Lovenox '120mg'$  BID ? ?

## 2021-10-10 DIAGNOSIS — E1169 Type 2 diabetes mellitus with other specified complication: Secondary | ICD-10-CM | POA: Diagnosis not present

## 2021-10-11 ENCOUNTER — Telehealth (HOSPITAL_COMMUNITY): Payer: Self-pay | Admitting: *Deleted

## 2021-10-15 ENCOUNTER — Telehealth (HOSPITAL_COMMUNITY): Payer: Self-pay | Admitting: *Deleted

## 2021-10-18 ENCOUNTER — Ambulatory Visit (INDEPENDENT_AMBULATORY_CARE_PROVIDER_SITE_OTHER): Payer: Medicare HMO

## 2021-10-18 ENCOUNTER — Other Ambulatory Visit: Payer: Self-pay | Admitting: Internal Medicine

## 2021-10-18 DIAGNOSIS — I4891 Unspecified atrial fibrillation: Secondary | ICD-10-CM

## 2021-10-18 DIAGNOSIS — I513 Intracardiac thrombosis, not elsewhere classified: Secondary | ICD-10-CM | POA: Diagnosis not present

## 2021-10-18 LAB — POCT INR: INR: 3.3 — AB (ref 2.0–3.0)

## 2021-10-18 MED ORDER — ENOXAPARIN SODIUM 120 MG/0.8ML IJ SOSY: 120.0000 mg | PREFILLED_SYRINGE | Freq: Two times a day (BID) | INTRAMUSCULAR | 1 refills | Status: DC

## 2021-10-18 NOTE — Patient Instructions (Addendum)
Description   ?- Only take 0.5 tablet tonight and then continue taking 1.5 tablets daily except 1 tablet on Mondays and Fridays. ?- On 10/26/21 follow pre-procedure instructions.    ?- Recheck INR 1 week post procedure.  ? ?Amiodarone discontinued on 05/03/21.  ?Coumadin Clinic [phone].  ?  ?   ?5/13: Last dose of warfarin. ? ?5/14: No warfarin or enoxaparin (Lovenox). ? ?5/15: Inject enoxaparin '120mg'$  in the fatty abdominal tissue at least 2 inches from the belly button twice a day about 12 hours apart, 8am and 8pm rotate sites. No warfarin. ? ?5/16: Inject enoxaparin in the fatty tissue every 12 hours, 8am and 8pm. No warfarin. ? ?5/17: Inject enoxaparin in the fatty tissue every 12 hours, 8am and 8pm. No warfarin. ? ?5/18: Inject enoxaparin in the fatty tissue in the morning at 8 am (No PM dose). No warfarin. ? ?5/19: Procedure Day - No enoxaparin - Resume warfarin in the evening or as directed by doctor. ? ?5/20: Resume enoxaparin inject in the fatty tissue every 12 hours and take warfarin ? ?5/21: Inject enoxaparin in the fatty tissue every 12 hours and take warfarin ? ?5/22: Inject enoxaparin in the fatty tissue every 12 hours and take warfarin ? ?5/23: Inject enoxaparin in the fatty tissue every 12 hours and take warfarin ? ?5/24: Inject enoxaparin in the fatty tissue every 12 hours and take warfarin ? ?5/25: Inject enoxaparin in the fatty tissue every 12 hours and take warfarin ? ?5/26: warfarin appt to check INR.  ?

## 2021-10-21 ENCOUNTER — Ambulatory Visit: Payer: Medicare HMO | Admitting: Internal Medicine

## 2021-10-21 ENCOUNTER — Other Ambulatory Visit: Payer: Self-pay | Admitting: Internal Medicine

## 2021-10-30 ENCOUNTER — Other Ambulatory Visit (HOSPITAL_COMMUNITY): Payer: Self-pay

## 2021-10-30 DIAGNOSIS — I5022 Chronic systolic (congestive) heart failure: Secondary | ICD-10-CM

## 2021-10-30 MED ORDER — FUROSEMIDE 20 MG PO TABS: 10.0000 mg | ORAL_TABLET | Freq: Two times a day (BID) | ORAL | 2 refills | Status: DC

## 2021-11-01 ENCOUNTER — Other Ambulatory Visit: Payer: Self-pay

## 2021-11-01 ENCOUNTER — Ambulatory Visit (HOSPITAL_COMMUNITY)
Admission: RE | Admit: 2021-11-01 | Discharge: 2021-11-01 | Disposition: A | Discharge status: Home / Self Care | Payer: Medicare HMO | Attending: Cardiology | Admitting: Cardiology

## 2021-11-01 ENCOUNTER — Encounter (HOSPITAL_COMMUNITY)
Admission: RE | Disposition: A | Discharge status: Home / Self Care | Payer: Self-pay | Source: Home / Self Care | Attending: Cardiology

## 2021-11-01 ENCOUNTER — Telehealth: Payer: Self-pay

## 2021-11-01 DIAGNOSIS — I130 Hypertensive heart and chronic kidney disease with heart failure and stage 1 through stage 4 chronic kidney disease, or unspecified chronic kidney disease: Secondary | ICD-10-CM | POA: Diagnosis not present

## 2021-11-01 DIAGNOSIS — I4892 Unspecified atrial flutter: Secondary | ICD-10-CM | POA: Insufficient documentation

## 2021-11-01 DIAGNOSIS — I5022 Chronic systolic (congestive) heart failure: Secondary | ICD-10-CM | POA: Insufficient documentation

## 2021-11-01 DIAGNOSIS — I480 Paroxysmal atrial fibrillation: Secondary | ICD-10-CM | POA: Diagnosis not present

## 2021-11-01 DIAGNOSIS — Z79899 Other long term (current) drug therapy: Secondary | ICD-10-CM | POA: Insufficient documentation

## 2021-11-01 DIAGNOSIS — Z7984 Long term (current) use of oral hypoglycemic drugs: Secondary | ICD-10-CM | POA: Insufficient documentation

## 2021-11-01 DIAGNOSIS — N183 Chronic kidney disease, stage 3 unspecified: Secondary | ICD-10-CM | POA: Diagnosis not present

## 2021-11-01 DIAGNOSIS — E1122 Type 2 diabetes mellitus with diabetic chronic kidney disease: Secondary | ICD-10-CM | POA: Insufficient documentation

## 2021-11-01 DIAGNOSIS — G4733 Obstructive sleep apnea (adult) (pediatric): Secondary | ICD-10-CM | POA: Diagnosis not present

## 2021-11-01 DIAGNOSIS — I428 Other cardiomyopathies: Secondary | ICD-10-CM | POA: Diagnosis not present

## 2021-11-01 DIAGNOSIS — Z794 Long term (current) use of insulin: Secondary | ICD-10-CM | POA: Insufficient documentation

## 2021-11-01 DIAGNOSIS — I2510 Atherosclerotic heart disease of native coronary artery without angina pectoris: Secondary | ICD-10-CM | POA: Diagnosis not present

## 2021-11-01 HISTORY — PX: RIGHT/LEFT HEART CATH AND CORONARY ANGIOGRAPHY: CATH118266

## 2021-11-01 LAB — PROTIME-INR
INR: 1.4 — ABNORMAL HIGH (ref 0.8–1.2)
Prothrombin Time: 17.2 seconds — ABNORMAL HIGH (ref 11.4–15.2)

## 2021-11-01 LAB — GLUCOSE, CAPILLARY: Glucose-Capillary: 144 mg/dL — ABNORMAL HIGH (ref 70–99)

## 2021-11-01 SURGERY — RIGHT/LEFT HEART CATH AND CORONARY ANGIOGRAPHY
Anesthesia: LOCAL

## 2021-11-01 MED ORDER — VERAPAMIL HCL 2.5 MG/ML IV SOLN
INTRAVENOUS | Status: DC | PRN
  Administered 2021-11-01: 10 mL via INTRA_ARTERIAL

## 2021-11-01 MED ORDER — LIDOCAINE HCL (PF) 1 % IJ SOLN
INTRAMUSCULAR | Status: DC | PRN
  Administered 2021-11-01: 4 mL

## 2021-11-01 MED ORDER — SODIUM CHLORIDE 0.9% FLUSH: 3.0000 mL | INTRAVENOUS | Status: DC | PRN

## 2021-11-01 MED ORDER — LABETALOL HCL 5 MG/ML IV SOLN: 10.0000 mg | INTRAVENOUS | Status: DC | PRN

## 2021-11-01 MED ORDER — HEPARIN SODIUM (PORCINE) 1000 UNIT/ML IJ SOLN
INTRAMUSCULAR | Status: DC | PRN
Start: 2021-11-01 — End: 2021-11-01
  Administered 2021-11-01: 6000 [IU] via INTRAVENOUS

## 2021-11-01 MED ORDER — MIDAZOLAM HCL 2 MG/2ML IJ SOLN
INTRAMUSCULAR | Status: DC | PRN
  Administered 2021-11-01: 1 mg via INTRAVENOUS

## 2021-11-01 MED ORDER — FENTANYL CITRATE (PF) 100 MCG/2ML IJ SOLN
INTRAMUSCULAR | Status: DC | PRN
Start: 2021-11-01 — End: 2021-11-01
  Administered 2021-11-01: 25 ug via INTRAVENOUS

## 2021-11-01 MED ORDER — SODIUM CHLORIDE 0.9 % IV SOLN: 250.0000 mL | INTRAVENOUS | Status: DC | PRN

## 2021-11-01 MED ORDER — IOHEXOL 350 MG/ML SOLN
INTRAVENOUS | Status: DC | PRN
  Administered 2021-11-01: 55 mL

## 2021-11-01 MED ORDER — SODIUM CHLORIDE 0.9% FLUSH: 3.0000 mL | Freq: Two times a day (BID) | INTRAVENOUS | Status: DC

## 2021-11-01 MED ORDER — ASPIRIN 81 MG PO CHEW
81.0000 mg | CHEWABLE_TABLET | Freq: Once | ORAL | Status: AC
  Administered 2021-11-01: 81 mg via ORAL
  Filled 2021-11-01: qty 1

## 2021-11-01 MED ORDER — HEPARIN (PORCINE) IN NACL 1000-0.9 UT/500ML-% IV SOLN
INTRAVENOUS | Status: DC | PRN
  Administered 2021-11-01 (×2): 500 mL

## 2021-11-01 MED ORDER — FENTANYL CITRATE (PF) 100 MCG/2ML IJ SOLN
INTRAMUSCULAR | Status: AC
  Filled 2021-11-01: qty 2

## 2021-11-01 MED ORDER — MIDAZOLAM HCL 2 MG/2ML IJ SOLN
INTRAMUSCULAR | Status: AC
  Filled 2021-11-01: qty 2

## 2021-11-01 MED ORDER — SODIUM CHLORIDE 0.9 % IV SOLN: INTRAVENOUS | Status: DC

## 2021-11-01 MED ORDER — LIDOCAINE HCL (PF) 1 % IJ SOLN
INTRAMUSCULAR | Status: AC
  Filled 2021-11-01: qty 30

## 2021-11-01 MED ORDER — HEPARIN (PORCINE) IN NACL 1000-0.9 UT/500ML-% IV SOLN
INTRAVENOUS | Status: AC
  Filled 2021-11-01: qty 1000

## 2021-11-01 MED ORDER — HEPARIN SODIUM (PORCINE) 1000 UNIT/ML IJ SOLN
INTRAMUSCULAR | Status: AC
Start: 2021-11-01 — End: ?
  Filled 2021-11-01: qty 10

## 2021-11-01 MED ORDER — ONDANSETRON HCL 4 MG/2ML IJ SOLN: 4.0000 mg | Freq: Four times a day (QID) | INTRAMUSCULAR | Status: DC | PRN

## 2021-11-01 MED ORDER — SODIUM CHLORIDE 0.9% FLUSH
3.0000 mL | INTRAVENOUS | Status: DC | PRN
  Administered 2021-11-01: 3 mL via INTRAVENOUS

## 2021-11-01 MED ORDER — HYDRALAZINE HCL 20 MG/ML IJ SOLN: 10.0000 mg | INTRAMUSCULAR | Status: DC | PRN

## 2021-11-01 MED ORDER — VERAPAMIL HCL 2.5 MG/ML IV SOLN
INTRAVENOUS | Status: AC
  Filled 2021-11-01: qty 2

## 2021-11-01 MED ORDER — ACETAMINOPHEN 325 MG PO TABS: 650.0000 mg | ORAL_TABLET | ORAL | Status: DC | PRN

## 2021-11-01 SURGICAL SUPPLY — 10 items
CATH 5FR JL3.5 JR4 ANG PIG MP (CATHETERS) ×1 IMPLANT
CATH BALLN WEDGE 5F 110CM (CATHETERS) ×1 IMPLANT
DEVICE RAD TR BAND REGULAR (VASCULAR PRODUCTS) ×1 IMPLANT
GLIDESHEATH SLEND SS 6F .021 (SHEATH) ×1 IMPLANT
GUIDEWIRE INQWIRE 1.5J.035X260 (WIRE) IMPLANT
INQWIRE 1.5J .035X260CM (WIRE) ×4
KIT HEART LEFT (KITS) ×3 IMPLANT
PACK CARDIAC CATHETERIZATION (CUSTOM PROCEDURE TRAY) ×3 IMPLANT
SHEATH GLIDE SLENDER 4/5FR (SHEATH) ×1 IMPLANT
TRANSDUCER W/STOPCOCK (MISCELLANEOUS) ×3 IMPLANT

## 2021-11-01 NOTE — Telephone Encounter (Signed)
Message received from Dr Aundra Dubin:   From: Larey Dresser, MD  Sent: 11/01/2021   9:55 AM EDT  To: Scarlette Calico, RN, [place], RN, *   1. Just had cath today, needs to restart Lovenox and warfarin tonight with followup with his coumadin clinic early next week.  Please make sure this gets done.  2.  Please make referral to EP for ICD for hypertrophic cardiomyopathy and low EF.  Would like him seen ASAP.    Pt has scheduled anticoagulation appt on 11/06/21 at 3:45pm. Called pt and confirmed appt.

## 2021-11-01 NOTE — Discharge Instructions (Signed)
1. Restart Lovenox and warfarin tonight.  2. Followup with coumadin clinic regarding Lovenox.

## 2021-11-01 NOTE — Progress Notes (Signed)
BS 121 from pt's [place] monitor

## 2021-11-01 NOTE — Interval H&P Note (Signed)
History and Physical Interval Note:  11/01/2021 9:25 AM  [name]  has presented today for surgery, with the diagnosis of heart failure.  The various methods of treatment have been discussed with the patient and family. After consideration of risks, benefits and other options for treatment, the patient has consented to  Procedure(s): RIGHT/LEFT HEART CATH AND CORONARY ANGIOGRAPHY (N/A) as a surgical intervention.  The patient's history has been reviewed, patient examined, no change in status, stable for surgery.  I have reviewed the patient's chart and labs.  Questions were answered to the patient's satisfaction.     Alverto Shedd [employer]

## 2021-11-04 ENCOUNTER — Encounter (HOSPITAL_COMMUNITY): Payer: Self-pay | Admitting: Cardiology

## 2021-11-06 ENCOUNTER — Other Ambulatory Visit: Payer: Self-pay | Admitting: Cardiology

## 2021-11-06 ENCOUNTER — Ambulatory Visit (INDEPENDENT_AMBULATORY_CARE_PROVIDER_SITE_OTHER): Payer: Medicare HMO

## 2021-11-06 DIAGNOSIS — I513 Intracardiac thrombosis, not elsewhere classified: Secondary | ICD-10-CM | POA: Diagnosis not present

## 2021-11-06 DIAGNOSIS — I480 Paroxysmal atrial fibrillation: Secondary | ICD-10-CM

## 2021-11-06 DIAGNOSIS — I4891 Unspecified atrial fibrillation: Secondary | ICD-10-CM

## 2021-11-06 LAB — POCT INR: INR: 2.1 (ref 2.0–3.0)

## 2021-11-06 NOTE — Patient Instructions (Signed)
Description   Take 2 tablets today, then resume same dosage 1.5 tablets daily except 1 tablet on Mondays and Fridays. Continue Lovenox injections, take your last dosage of Lovenox Thursday evening.  Coumadin Clinic [phone].

## 2021-11-08 ENCOUNTER — Telehealth: Payer: Self-pay

## 2021-11-08 ENCOUNTER — Ambulatory Visit: Payer: Medicare HMO | Admitting: Internal Medicine

## 2021-11-08 ENCOUNTER — Encounter: Payer: Self-pay | Admitting: Internal Medicine

## 2021-11-08 VITALS — BP 114/76 | HR 67 | Ht 73.0 in | Wt 267.8 lb

## 2021-11-08 DIAGNOSIS — I428 Other cardiomyopathies: Secondary | ICD-10-CM | POA: Diagnosis not present

## 2021-11-08 DIAGNOSIS — I10 Essential (primary) hypertension: Secondary | ICD-10-CM | POA: Diagnosis not present

## 2021-11-08 DIAGNOSIS — I5022 Chronic systolic (congestive) heart failure: Secondary | ICD-10-CM

## 2021-11-08 NOTE — Patient Instructions (Addendum)
Medication Instructions:  Your physician recommends that you continue on your current medications as directed. Please refer to the Current Medication list given to you today.  Labwork: None ordered.  Testing/Procedures: None ordered.  Follow-Up:  The following dates are available for ICD implant:  June 20 July 6, 10, 14, 17, 18, 31  Any Other Special Instructions Will Be Listed Below (If Applicable).  If you need a refill on your cardiac medications before your next appointment, please call your pharmacy.   Important Information About Sugar

## 2021-11-08 NOTE — Telephone Encounter (Signed)
That is great news, that he does not need the rapid acting insulin anymore!  If the sugars remain low, he also needs to also start reducing his Tresiba dose to 28 units daily or even lower.

## 2021-11-08 NOTE — Telephone Encounter (Signed)
Pt called to advise his blood sugars have been running low since Sunday. Pt has stopped taking Novolog due to readings in the 70's. Pt is currently on 0.5 mg of Ozempic and 32 units of Tresiba. Has not needed meal time insulin and wanted to see if he should change anything.

## 2021-11-08 NOTE — Progress Notes (Signed)
        HPI [name] is referred by Dr. McLean for consideration for ICD insertion. He is a pleasant 66 yo man with a h/o atrial fib s/p ablation, HTN, dyslipidemia, who has developed chronic systolic heart failure due to a non-ischemic CM. He has not had syncope. He denies chest pain. He has class 2 symptoms. He underwent left heart cath with minimal disease. His EF by echo was 30-35% 6 months ago. He has on GDMT under the direction of Dr. McLean.   Not on File           Current Outpatient Medications  Medication Sig Dispense Refill   albuterol (PROVENTIL HFA;VENTOLIN HFA) 108 (90 Base) MCG/ACT inhaler Inhale 2 puffs into the lungs every 6 (six) hours as needed for wheezing or shortness of breath.       BD PEN NEEDLE NANO 2ND GEN 32G X 4 MM MISC USE 4 TIMES A DAY AS DIRECTED       BD PEN NEEDLE NANO 2ND GEN 32G X 4 MM MISC USE 4 TIMES A DAY AS DIRECTED 100 each 4   budesonide-formoterol (SYMBICORT) 80-4.5 MCG/ACT inhaler Inhale 2 puffs into the lungs daily as needed (Shortness of breath).       carvedilol (COREG) 12.5 MG tablet Take 1.5 tablets (18.75 mg total) by mouth 2 (two) times daily with a meal. 270 tablet 3   cholecalciferol (VITAMIN D3) 25 MCG (1000 UNIT) tablet Take 1,000 Units by mouth daily.       Continuous Blood Gluc Receiver (FREESTYLE LIBRE 14 DAY READER) DEVI USE AS DIRECTED 1 each 11   doxylamine, Sleep, (UNISOM) 25 MG tablet Take 25 mg by mouth at bedtime.       empagliflozin (JARDIANCE) 10 MG TABS tablet Take 1 tablet (10 mg total) by mouth daily. 30 tablet 5   furosemide (LASIX) 20 MG tablet Take 0.5 tablets (10 mg total) by mouth 2 (two) times daily. 30 tablet 2   Insulin Degludec (TRESIBA FLEXTOUCH [place]) Inject 32 Units into the skin daily.       levothyroxine (SYNTHROID) 50 MCG tablet TAKE 1 TABLET BY MOUTH EVERY DAY BEFORE BREAKFAST 90 tablet 2   mometasone (NASONEX) 50 MCG/ACT nasal spray Place 2 sprays into the nose daily as needed (allergies or rhinitis).        Multiple Vitamins-Minerals (MULTIVITAMIN WITH MINERALS) tablet Take 2 tablets by mouth at bedtime. Chewable       NOVOLOG FLEXPEN 100 UNIT/ML FlexPen INJECT 8-12 UNITS INTO THE SKIN 3 (THREE) TIMES DAILY WITH MEALS. 30 mL 3   sacubitril-valsartan (ENTRESTO) 97-103 MG Take 1 tablet by mouth 2 (two) times daily. 180 tablet 3   Semaglutide,0.25 or 0.5MG/DOS, (OZEMPIC, 0.25 OR 0.5 MG/DOSE,) 2 MG/3ML SOPN Inject 0.5 mg into the skin once a week. 9 mL 1   spironolactone (ALDACTONE) 25 MG tablet TAKE 1 TABLET BY MOUTH EVERY DAY 90 tablet 1   vitamin C (ASCORBIC ACID) 500 MG tablet Take 500 mg by mouth in the morning.       warfarin (COUMADIN) 5 MG tablet TAKE 1 TABLET TO 1 AND 1/2 TABLETS BY MOUTH DAILY AS DIRECTED BY THE COUMADIN CLINIC. 135 tablet 1    No current facility-administered medications for this visit.            Past Medical History:  Diagnosis Date   Asthma      as a teenager - does not use an inhaler although pt said he   has an albuteral inhaler   Atrial fibrillation (HCC)      persistant 02/2009   Atrial flutter (HCC)      s/p CTI ablation 08/06/10   Cataract     Chronic rhinitis     Colon polyp     Congestive heart failure (HCC)     Diverticulosis      colonoscopy 04/03/2009   Hyperlipidemia     Hypertension     Morbid obesity (HCC)      target weight = 219  for BMI < 30   Nonischemic cardiomyopathy (HCC)      tachycardia mediated   Seasonal allergies     Sleep apnea      original 2001 - wears C-PaP   Type 2 diabetes mellitus (HCC)        ROS:    All systems reviewed and negative except as noted in the HPI.          Past Surgical History:  Procedure Laterality Date   ATRIAL FIBRILLATION ABLATION N/A 12/14/2018    Procedure: ATRIAL FIBRILLATION ABLATION;  Surgeon: Allred, James, MD;  [facility]: [facility];  Service: Cardiovascular;  Laterality: N/A;   ATRIAL FIBRILLATION ABLATION N/A 10/18/2020    Procedure: ATRIAL FIBRILLATION ABLATION;  Surgeon:  Allred, James, MD;  Location: [facility];  Service: Cardiovascular;  Laterality: N/A;   atrial flutter ablation   08/06/10   CARDIOVERSION N/A 07/05/2018    Procedure: CARDIOVERSION;  Surgeon: Christopher, Bridgette, MD;  Location: [facility];  Service: Cardiovascular;  Laterality: N/A;   CARDIOVERSION N/A 08/08/2020    Procedure: CARDIOVERSION;  Surgeon: McLean, Dalton S, MD;  Location: [facility];  Service: Cardiovascular;  Laterality: N/A;   CARDIOVERSION N/A 08/29/2020    Procedure: CARDIOVERSION;  Surgeon: Bensimhon, Daniel R, MD;  Location: [facility];  Service: Cardiovascular;  Laterality: N/A;   COLONOSCOPY       POLYPECTOMY       RIGHT HEART CATH N/A 08/06/2020    Procedure: RIGHT HEART CATH;  Surgeon: McLean, Dalton S, MD;  [facility]: [facility];  Service: Cardiovascular;  Laterality: N/A;   RIGHT/LEFT HEART CATH AND CORONARY ANGIOGRAPHY N/A 11/01/2021    Procedure: RIGHT/LEFT HEART CATH AND CORONARY ANGIOGRAPHY;  Surgeon: McLean, Dalton S, MD;  [facility]: [facility];  Service: Cardiovascular;  Laterality: N/A;   TEE WITHOUT CARDIOVERSION N/A 12/14/2018    Procedure: TRANSESOPHAGEAL ECHOCARDIOGRAM (TEE);  Surgeon: Allred, James, MD;  [facility];  Service: Cardiovascular;  Laterality: N/A;   TEE WITHOUT CARDIOVERSION N/A 08/03/2020    Procedure: TRANSESOPHAGEAL ECHOCARDIOGRAM (TEE);  Surgeon: Croitoru, Mihai, MD;  Location[facility];  Service: Cardiovascular;  Laterality: N/A;   TEE WITHOUT CARDIOVERSION N/A 08/08/2020    Procedure: TRANSESOPHAGEAL ECHOCARDIOGRAM (TEE);  Surgeon: McLean, Dalton S, MD;  [facility];  Service: Cardiovascular;  Laterality: N/A;   TEE WITHOUT CARDIOVERSION N/A 10/18/2020    Procedure: TRANSESOPHAGEAL ECHOCARDIOGRAM (TEE);  Surgeon: Skains, Mark C, MD;  Location: [facility];  Service: Cardiovascular;  Laterality: N/A;             Family History  Problem Relation Age of Onset   Hypertension Mother      Kidney failure Mother          Due to sepsis   COPD Father     Colon cancer Brother          dx in 60's   Esophageal cancer Neg Hx       Rectal cancer Neg Hx     Stomach cancer Neg Hx          Social History         Socioeconomic History   Marital status: Single      Spouse name: Not on file   Number of children: 0   Years of education: Not on file   Highest education level: Not on file  Occupational History   Occupation: Sales  Tobacco Use   Smoking status: Never   Smokeless tobacco: Never  Vaping Use   Vaping Use: Never used  Substance and Sexual Activity   Alcohol use: Yes      Alcohol/week: 21.0 standard drinks      Types: 21 Cans of beer per week      Comment: previously quite heavy, working on cessation    Drug use: No   Sexual activity: Not on file  Other Topics Concern   Not on file  Social History Narrative    Lives in [place] and works for [employer]    Social Determinants of Health       Financial Resource Strain: Low Risk    Difficulty of Paying Living Expenses: Not hard at all  Food Insecurity: No Food Insecurity   Worried About Running Out of Food in the Last Year: Never true   Ran Out of Food in the Last Year: Never true  Transportation Needs: No Transportation Needs   Lack of Transportation (Medical): No   Lack of Transportation (Non-Medical): No  Physical Activity: Sufficiently Active   Days of Exercise per Week: 5 days   Minutes of Exercise per Session: 30 min  Stress: No Stress Concern Present   Feeling of Stress : Not at all  Social Connections: Unknown   Frequency of Communication with Friends and Family: More than three times a week   Frequency of Social Gatherings with Friends and Family: Twice a week   Attends Religious Services: Patient refused   Active Member of Clubs or Organizations: Patient refused   Attends Club or Organization Meetings: Patient refused   Marital Status: Never married  Intimate Partner Violence: Not At  Risk   Fear of Current or Ex-Partner: No   Emotionally Abused: No   Physically Abused: No   Sexually Abused: No        BP 114/76   Pulse 67   Ht 6' 1" (1.854 m)   Wt 267 lb 12.8 oz (121.5 kg)   SpO2 94%   BMI 35.33 kg/m    Physical Exam:   Well appearing NAD HEENT: Unremarkable Neck:  No JVD, no thyromegally Lymphatics:  No adenopathy Back:  No CVA tenderness Lungs:  Clear HEART:  Regular rate rhythm, no murmurs, no rubs, no clicks Abd:  soft, positive bowel sounds, no organomegally, no rebound, no guarding Ext:  2 plus pulses, no edema, no cyanosis, no clubbing Skin:  No rashes no nodules Neuro:  CN II through XII intact, motor grossly intact   EKG - nsr with IVCD     Assess/Plan:  Chronic systolic heart failure - I have discussed the indications/risks/benefits/goals/expectations of ICD insertion with the patient and he will call us if he wishes to proceed. 2. HTN - his bp is controlled on medical therapy. We will follow.   Reed Dady,MD 

## 2021-11-14 NOTE — Telephone Encounter (Signed)
Noted. OK. 

## 2021-11-14 NOTE — Telephone Encounter (Signed)
Pt contacted and advised he has resumed Novolog due to some high morning bs readings. But they have since regulated to his normal range

## 2021-11-18 ENCOUNTER — Ambulatory Visit (INDEPENDENT_AMBULATORY_CARE_PROVIDER_SITE_OTHER): Payer: Medicare HMO

## 2021-11-18 ENCOUNTER — Encounter (HOSPITAL_COMMUNITY): Payer: Self-pay | Admitting: Cardiology

## 2021-11-18 ENCOUNTER — Ambulatory Visit (HOSPITAL_COMMUNITY)
Admission: RE | Admit: 2021-11-18 | Discharge: 2021-11-18 | Disposition: A | Discharge status: Home / Self Care | Payer: Medicare HMO | Source: Ambulatory Visit | Attending: Cardiology | Admitting: Cardiology

## 2021-11-18 VITALS — BP 100/62 | HR 69 | Wt 265.2 lb

## 2021-11-18 DIAGNOSIS — I4892 Unspecified atrial flutter: Secondary | ICD-10-CM | POA: Diagnosis not present

## 2021-11-18 DIAGNOSIS — I5022 Chronic systolic (congestive) heart failure: Secondary | ICD-10-CM | POA: Insufficient documentation

## 2021-11-18 DIAGNOSIS — Z7984 Long term (current) use of oral hypoglycemic drugs: Secondary | ICD-10-CM | POA: Diagnosis not present

## 2021-11-18 DIAGNOSIS — Z5181 Encounter for therapeutic drug level monitoring: Secondary | ICD-10-CM | POA: Diagnosis not present

## 2021-11-18 DIAGNOSIS — Z79899 Other long term (current) drug therapy: Secondary | ICD-10-CM | POA: Diagnosis not present

## 2021-11-18 DIAGNOSIS — I480 Paroxysmal atrial fibrillation: Secondary | ICD-10-CM | POA: Insufficient documentation

## 2021-11-18 DIAGNOSIS — I130 Hypertensive heart and chronic kidney disease with heart failure and stage 1 through stage 4 chronic kidney disease, or unspecified chronic kidney disease: Secondary | ICD-10-CM | POA: Insufficient documentation

## 2021-11-18 DIAGNOSIS — I2510 Atherosclerotic heart disease of native coronary artery without angina pectoris: Secondary | ICD-10-CM | POA: Diagnosis not present

## 2021-11-18 DIAGNOSIS — N183 Chronic kidney disease, stage 3 unspecified: Secondary | ICD-10-CM | POA: Diagnosis not present

## 2021-11-18 DIAGNOSIS — E1122 Type 2 diabetes mellitus with diabetic chronic kidney disease: Secondary | ICD-10-CM | POA: Insufficient documentation

## 2021-11-18 DIAGNOSIS — I422 Other hypertrophic cardiomyopathy: Secondary | ICD-10-CM | POA: Insufficient documentation

## 2021-11-18 DIAGNOSIS — G4733 Obstructive sleep apnea (adult) (pediatric): Secondary | ICD-10-CM | POA: Diagnosis not present

## 2021-11-18 DIAGNOSIS — Z8249 Family history of ischemic heart disease and other diseases of the circulatory system: Secondary | ICD-10-CM | POA: Diagnosis not present

## 2021-11-18 DIAGNOSIS — Z7901 Long term (current) use of anticoagulants: Secondary | ICD-10-CM | POA: Diagnosis not present

## 2021-11-18 DIAGNOSIS — Z794 Long term (current) use of insulin: Secondary | ICD-10-CM | POA: Insufficient documentation

## 2021-11-18 LAB — BASIC METABOLIC PANEL
Anion gap: 8 (ref 5–15)
BUN: 17 mg/dL (ref 8–23)
CO2: 27 mmol/L (ref 22–32)
Calcium: 9.1 mg/dL (ref 8.9–10.3)
Chloride: 100 mmol/L (ref 98–111)
Creatinine, Ser: 1.45 mg/dL — ABNORMAL HIGH (ref 0.61–1.24)
GFR, Estimated: 53 mL/min — ABNORMAL LOW (ref >60)
Glucose, Bld: 99 mg/dL (ref 70–99)
Potassium: 4.2 mmol/L (ref 3.5–5.1)
Sodium: 135 mmol/L (ref 135–145)

## 2021-11-18 LAB — PROTIME-INR
INR: 3.4 — ABNORMAL HIGH (ref 0.8–1.2)
Prothrombin Time: 34 seconds — ABNORMAL HIGH (ref 11.4–15.2)

## 2021-11-18 NOTE — Progress Notes (Signed)
PCP:  Dr Sharlet Salina  Primary Cardiologist: Dr Harrell Gave HF MD: Dr Aundra Dubin  EP: Dr Rayann Heman   HPI: 66 y.o. male w/ h/o cardiomyopathy, presumed to be tachy-mediated from atrial fibrillation/atrial flutter.  Paroxsymal Afib/flutter dates back to ~2012. Had Aflutter ablation in 2012. He had several echos between 2012-2018 showing normal LVEF. Found to have reduced EF on echo in 04/2018, down to 30-35%. Was in afib at the time. 11/2018 was referred for Afib ablation but procedure was aborted after TEE showed LA thrombus, despite being on Xarelto. Anticoagulation changed to Pradaxa. He ultimately spontaneously converted back to NSR. Repeat echo 10/20 showed EF 35-40%.  Plans were made for [facility] 10/20 however had asymptomatic positive COVID test, then renal failure. Ischemic eval changed to [employer] which showed no ischemia in 10/20. Echo repeated 3/21, EF 40-45%, RV normal.    Seen in cardiology clinic, back in atrial fibrillation with RVR.  Presented for outpatient DCCV 08/03/20. TEE showed LA thrombus. DCCV canceled. EF lower on TEE ~5-10% with RV severely reduced. Admitted and started on IV Lasix and PICC line was placed. Milrinone started for low output, initial co-ox 35%. Pradaxa stopped and he was placed on coumadin w/ heparin bridge. Placed on IV amio for afib. He had repeat TEE on 08/07/20 that showed no LAA thrombus so DCCV was done successfully. Transitioned to PO amio 200 mg bid. Remained in NSR. Diuresed well w/ IV Lasix and able to wean of milrinone. Discharge weight 264 pounds.   He went back into atrial fibrillation in 3/22, had another DCCV back to NSR.  He had a TEE in 5/22 showing EF 30-35%, then had atrial fibrillation ablation.    Echo in 7/22 showed showed EF 40-45% with normal RV and moderate MR.   Patient has polycythemia and was noted to be a compound heterozygote for hereditary hemochromatosis.  Cardiac MRI was done in 11/22 showing EF 27%, moderate asymmetric septal  hypertrophy, diffuse hypokinesis worse in the septal wall, RV EF 30%, noncoronary LGE with diffuse mid-wall involvement especially in the septum, T2* sequences not suggestive of cardiac hemochromatosis, ECV 50% in the septum.  Echo in 11/22 showed EF 30-35%, severe asymmetric septal hypertrophy, no LVOT gradient or mitral valve SAM, mild RV dilation with mildly decreased RV systolic function.    Patient saw hematology, will not get therapeutic phlebotomy as long as ferritin < 200 and transferrin saturation < 75%.   PYP scan in 11/22 was not suggestive of TTR cardiac amyloidosis.  Genetic testing showed that patient was a heterozygote for MYH7 gene mutation that has been linked to hypertrophic cardiomyopathy.   Last Visit 4/23, was doing well from CHF standpoint w/ NYHA Class I-II symptoms. Coreg was increased to 18.75 mg bid. He was set up for [facility] to fully rule out CAD/ischemic cardiomyopathy prior to EP referral for ICD.   [facility] 5/23 showed mild nonobstructive CAD w/ 25% mLCx and 25% pRCA lesions, normal filling pressures and low but not markedly low cardiac output, CI 2.16.   He returns back today for post cath f/u. Doing well. Denies CP. No exertional symptoms w/ ADLs. Played golf this past weekend w/o limitation. Denies palpitations. No dizziness, syncope, near syncope. Wt stable. Reports full med compliance and compliance w/ CPAP. Compliant w/ coumadin. No abnormal bleeding. Had EP consultation w/ Dr. Lovena Le on 5/26. Planning ICD end of July.     [facility] 5/23   Mid Cx lesion is 25% stenosed.   Prox RCA lesion is 25%  stenosed.   1. Mild nonobstructive CAD.  2. Normal filling pressures.  3. Low but not markedly low cardiac output.  Right Heart Pressures RHC Procedural Findings: Hemodynamics (mmHg) RA mean 1 RV 18/7 PA 20/12, mean 13 PCWP mean 8 LV 110/8 AO 111/67  Oxygen saturations: PA 71% AO 95%  Cardiac Output (Fick) 5.19  Cardiac Index (Fick) 2.16   He returns today for  followup of CHF.  He is in NSR.  Rarely drinking ETOH (prior heavy).  No significant exertional dyspnea.  He plays golf without problems.  No chest pain.  No lightheadedness.  No palpitations.   Labs (5/22): K 4.1, creatinine 1.49 Labs (9/22): hgb 20.3/HCT 60.1, K 4.9, creatinine 1.46, ferritin 116 Labs (11/22): Negative myeloma panel.  K 4.6, creatinine 1.27, LFTs normal, TSH normal.  Labs (3/23): K 3.5, creatinine 1.4, hgb 18  ECG (personally reviewed): NSR, IVCD 134 msec  PMH: 1. Atrial flutter: Ablation 2012.  2. Atrial fibrillation: H/o LAA thrombus on both Xarelto and Pradaxa.  DCCV 2/22, 3/22. Atrial fibrillation ablation in 5/22.  3. Chronic systolic CHF: ?tachycardia-mediated CMP with AF/RVR.  - Cardiolite (10/20): No ischemia.  - TEE (2/22): EF 5-10% with severe RV dysfunction.  - Echo (7/22): EF 40-45%, normal RV, moderate MR.  - Cardiac MRI (11/22): EF 27%, moderate asymmetric septal hypertrophy, diffuse hypokinesis worse in the septal wall, RV EF 30%, noncoronary LGE with diffuse mid-wall involvement especially in the septum, T2* sequences not suggestive of cardiac hemochromatosis, ECV 50% in the septum.  - Echo (11/22): EF 30-35%, severe asymmetric septal hypertrophy, no LVOT gradient or mitral valve SAM, mild RV dilation with mildly decreased RV systolic function.   - PYP scan (11/22): grade 1, H/CL 0.95 (negative) - Genetic testing showed that patient was a heterozygote for MYH7 gene mutation that has been linked to hypertrophic cardiomyopathy.  4. CKD stage 3 5. OSA: Uses CPAP 6. Type 2 diabetes 7. Hereditary hemochromatosis: Compound heterozygote with Cys282Tyr and His63Asp.   ROS: All systems negative except as listed in HPI, PMH and Problem List.  Social History   Socioeconomic History   Marital status: Single    Spouse name: Not on file   Number of children: 0   Years of education: Not on file   Highest education level: Not on file  Occupational History    Occupation: Sales  Tobacco Use   Smoking status: Never   Smokeless tobacco: Never  Vaping Use   Vaping Use: Never used  Substance and Sexual Activity   Alcohol use: Yes    Alcohol/week: 21.0 standard drinks    Types: 21 Cans of beer per week    Comment: previously quite heavy, working on cessation    Drug use: No   Sexual activity: Not on file  Other Topics Concern   Not on file  Social History Narrative   Lives in [place]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   Social Determinants of Health   Financial Resource Strain: Low Risk    Difficulty of Paying Living Expenses: Not hard at all  Food Insecurity: No Food Insecurity   Worried About Charity fundraiser in the Last Year: Never true   Arboriculturist in the Last Year: Never true  Transportation Needs: No Transportation Needs   Lack of Transportation (Medical): No   Lack of Transportation (Non-Medical): No  Physical Activity: Sufficiently Active   Days of Exercise per Week: 5 days   Minutes of Exercise per Session: 30 min  Stress: No Stress Concern Present   Feeling of Stress : Not at all  Social Connections: Unknown   Frequency of Communication with Friends and Family: More than three times a week   Frequency of Social Gatherings with Friends and Family: Twice a week   Attends Religious Services: Patient refused   Marine scientist or Organizations: Patient refused   Attends Music therapist: Patient refused   Marital Status: Never married  Human resources officer Violence: Not At Risk   Fear of Current or Ex-Partner: No   Emotionally Abused: No   Physically Abused: No   Sexually Abused: No    FH:  Family History  Problem Relation Age of Onset   Hypertension Mother    Kidney failure Mother        Due to sepsis   COPD Father    Colon cancer Brother        dx in 58's   Esophageal cancer Neg Hx    Rectal cancer Neg Hx    Stomach cancer Neg Hx     Past Medical History:  Diagnosis Date   Asthma    as a  teenager - does not use an inhaler although pt said he has an albuteral inhaler   Atrial fibrillation ([place])    persistant 02/2009   Atrial flutter ([place])    s/p CTI ablation 08/06/10   Cataract    Chronic rhinitis    Colon polyp    Congestive heart failure ([place])    Diverticulosis    colonoscopy 04/03/2009   Hyperlipidemia    Hypertension    Morbid obesity ([place])    target weight = 219  for BMI < 30   Nonischemic cardiomyopathy ([place])    tachycardia mediated   Seasonal allergies    Sleep apnea    original 2001 - wears C-PaP   Type 2 diabetes mellitus (HCC)     Current Outpatient Medications  Medication Sig Dispense Refill   albuterol (PROVENTIL HFA;VENTOLIN HFA) 108 (90 Base) MCG/ACT inhaler Inhale 2 puffs into the lungs every 6 (six) hours as needed for wheezing or shortness of breath.     BD PEN NEEDLE NANO 2ND GEN 32G X 4 MM MISC USE 4 TIMES A DAY AS DIRECTED     BD PEN NEEDLE NANO 2ND GEN 32G X 4 MM MISC USE 4 TIMES A DAY AS DIRECTED 100 each 4   budesonide-formoterol (SYMBICORT) 80-4.5 MCG/ACT inhaler Inhale 2 puffs into the lungs daily as needed (Shortness of breath).     carvedilol (COREG) 12.5 MG tablet Take 1.5 tablets (18.75 mg total) by mouth 2 (two) times daily with a meal. 270 tablet 3   cholecalciferol (VITAMIN D3) 25 MCG (1000 UNIT) tablet Take 1,000 Units by mouth daily.     Continuous Blood Gluc Receiver (FREESTYLE LIBRE 14 DAY READER) DEVI USE AS DIRECTED 1 each 11   doxylamine, Sleep, (UNISOM) 25 MG tablet Take 25 mg by mouth at bedtime.     empagliflozin (JARDIANCE) 10 MG TABS tablet Take 1 tablet (10 mg total) by mouth daily. 30 tablet 5   furosemide (LASIX) 20 MG tablet Take 0.5 tablets (10 mg total) by mouth 2 (two) times daily. 30 tablet 2   Insulin Degludec (TRESIBA FLEXTOUCH [place]) Inject 32 Units into the skin daily.     levothyroxine (SYNTHROID) 50 MCG tablet TAKE 1 TABLET BY MOUTH EVERY DAY BEFORE BREAKFAST 90 tablet 2   mometasone (NASONEX) 50 MCG/ACT nasal  spray Place 2  sprays into the nose daily as needed (allergies or rhinitis).     Multiple Vitamins-Minerals (MULTIVITAMIN WITH MINERALS) tablet Take 2 tablets by mouth at bedtime. Chewable     NOVOLOG FLEXPEN 100 UNIT/ML FlexPen INJECT 8-12 UNITS INTO THE SKIN 3 (THREE) TIMES DAILY WITH MEALS. 30 mL 3   sacubitril-valsartan (ENTRESTO) 97-103 MG Take 1 tablet by mouth 2 (two) times daily. 180 tablet 3   Semaglutide,0.25 or 0.'5MG'$ /DOS, (OZEMPIC, 0.25 OR 0.5 MG/DOSE,) 2 MG/3ML SOPN Inject 0.5 mg into the skin once a week. 9 mL 1   spironolactone (ALDACTONE) 25 MG tablet TAKE 1 TABLET BY MOUTH EVERY DAY 90 tablet 1   warfarin (COUMADIN) 5 MG tablet TAKE 1 TABLET TO 1 AND 1/2 TABLETS BY MOUTH DAILY AS DIRECTED BY THE COUMADIN CLINIC. 135 tablet 1   vitamin C (ASCORBIC ACID) 500 MG tablet Take 500 mg by mouth in the morning.     No current facility-administered medications for this encounter.    Vitals:   11/18/21 1329  BP: 100/62  Pulse: 69  SpO2: 95%  Weight: 120.3 kg (265 lb 3.2 oz)   Wt Readings from Last 3 Encounters:  11/18/21 120.3 kg (265 lb 3.2 oz)  11/08/21 121.5 kg (267 lb 12.8 oz)  11/01/21 118.4 kg (261 lb)   PHYSICAL EXAM: General:  Well appearing, moderately obese. No respiratory difficulty HEENT: normal Neck: supple. no JVD. Carotids 2+ bilat; no bruits. No lymphadenopathy or thyromegaly appreciated. Cor: PMI nondisplaced. Regular rate & rhythm. No rubs, gallops or murmurs. Lungs: clear Abdomen: soft, nontender, nondistended. No hepatosplenomegaly. No bruits or masses. Good bowel sounds. Extremities: no cyanosis, clubbing, rash, edema + b/l varicose veins  Neuro: alert & oriented x 3, cranial nerves grossly intact. moves all 4 extremities w/o difficulty. Affect pleasant.   ASSESSMENT & PLAN: 1.Chronic systolic CHF: TEE 5/00 with EF <10%, severe RV dysfunction, LA appendage thrombus. Echoes in the past have had EF ranging 30-45%. Cardiolite in 10/20 showed no ischemia.  Initially, thought to have tachycardia-mediated cardiomyopathy as he seems to become markedly more symptomatic when in atrial fibrillation.  Alternatively, he could have another cause for cardiomyopathy and atrial fibrillation just causes him to decompensate.  Echo in 7/22 in NSR showed EF 40-45% with normal RV, moderate MR (in NSR after atrial fibrillation ablation). He was found to be a compound heterozygote for hereditary hemochromatosis (evaluation done because of polycythemia), so I was concerned that he could have a hemochromatosis-related CMP.  However, cardiac MRI was done in 11/22, showing EF 27%, moderate asymmetric septal hypertrophy, diffuse hypokinesis worse in the septal wall, RV EF 30%, noncoronary LGE with diffuse mid-wall involvement especially in the septum, T2* sequences not suggestive of cardiac hemochromatosis, ECV 50% in the septum.  Cardiomyopathy appeared most consistent with hypertrophic cardiomyopathy (nonobstructive), less likely amyloidosis or sarcoidosis.  Myeloma panel negative and PYP scan not suggestive of TTR cardiac amyloidosis.  Genetic testing showed that patient was a heterozygote for MYH7 gene mutation that has been linked to hypertrophic cardiomyopathy => I suspect that HCM is the diagnosis here.  [facility] 5/23 showed mild nonobstructive CAD, normal filling pressures and low but not markedly low cardiac output, CI 2.16.  - NYHA class I-II symptoms. Euvolemic on exam  - Continue Lasix 10 mg bid     - Continue Coreg 18.75 mg bid. No further dose titration given soft BP and borderline low output  - Continue Entresto 97/103 bid.  - Continue Jardiance 10 mg daily.  - Continue  spironolactone 25 mg daily.   - Check BMP today  - I think he should get ICD with extensive scarring on MRI and persistent low EF.  QRS 134 msec today, probably not wide enough for CRT. Seen by Dr. Lovena Le. Planning implant 7/23.  - Has been referred to Dr. Lattie Corns for genetics counseling in the  setting of gene+ HCM suspected (heterozygote for MYH7 gene mutation that has been linked to hypertrophic cardiomyopathy). Appt scheduled 8/23 2. Atrial fibrillation: Paroxysmal. With LAA clots despite Xarelto and Pradaxa use, he was started on warfarin.  Will aim for INR 2.5-3.  AF ablation in 5/22.  RRR on exam today today. Denies symptoms of breakthrough Afib. He is off amiodarone post-ablation.  - Continue warfarin.  - Check INR today. Will route results to coumadin clinic  3. CKD stage 3:  - check BMP today   4. OSA: Reports full compliance w/ CPAP. Continue CPAP nightly.  5. Type 2 diabetes: Continue Jardiance 10 mg daily.  6. Hereditary hemochromatosis: Compound heterozygote.  T2* sequences on cardiac MRI were not suggestive of cardiac hemochromatosis.  - Therapeutic phlebotomy only if ferritin > 200 or transferrin saturation > 75%.  7. CAD: mild nonobstructive CAD on cath 5/23, 25% mLCx and 25% pRCA lesions - Last LP w/ LDL at goal, 69 mg/dL - no ASA w/ warfarin  - continue ? blocker  Followup w/ Dr. Aundra Dubin in 3 months   Lyda Jester, PA-C  11/18/2021

## 2021-11-18 NOTE — Patient Instructions (Signed)
There has been no changes to your medications. ? ?Labs done today, your results will be available in MyChart, we will contact you for abnormal readings. ? ? ?Your physician recommends that you schedule a follow-up appointment in: 3 months.   ? ?If you have any questions or concerns before your next appointment please send us a message through mychart or call our office at [phone].   ? ?TO LEAVE A MESSAGE FOR THE NURSE SELECT OPTION 2, PLEASE LEAVE A MESSAGE INCLUDING: ?YOUR NAME ?DATE OF BIRTH ?CALL BACK NUMBER ?REASON FOR CALL**this is important as we prioritize the call backs ? ?YOU WILL RECEIVE A CALL BACK THE SAME DAY AS LONG AS YOU CALL BEFORE 4:00 PM ? ?At the [facility], you and your health needs are our priority. As part of our continuing mission to provide you with exceptional heart care, we have created designated Provider Care Teams. These Care Teams include your primary Cardiologist (physician) and Advanced Practice Providers (APPs- Physician Assistants and Nurse Practitioners) who all work together to provide you with the care you need, when you need it.  ? ?You may see any of the following providers on your designated Care Team at your next follow up: ?Dr Daniel Bensimhon ?Dr Dalton McLean ?Amy Clegg, NP ?Brittainy Simmons, PA ?Jessica Milford,NP ?Lindsay Finch, PA ?[name] Kemp, PharmD ? ? ?Please be sure to bring in all your medications bottles to every appointment.  ? ? ?

## 2021-11-18 NOTE — Patient Instructions (Signed)
Description   Pt had INR drawn at [facility]. Called pt and instructed to only take 0.5 tablet today and eat a serving of greens. Then resume same dosage 1.5 tablets daily except 1 tablet on Mondays and Fridays.  Recheck INR in 2 weeks at [facility] [phone].

## 2021-11-19 ENCOUNTER — Encounter: Payer: Self-pay | Admitting: Internal Medicine

## 2021-11-19 ENCOUNTER — Ambulatory Visit (INDEPENDENT_AMBULATORY_CARE_PROVIDER_SITE_OTHER): Payer: Medicare HMO | Admitting: Internal Medicine

## 2021-11-19 VITALS — BP 100/70 | HR 64 | Ht 73.0 in | Wt 266.8 lb

## 2021-11-19 DIAGNOSIS — E785 Hyperlipidemia, unspecified: Secondary | ICD-10-CM | POA: Diagnosis not present

## 2021-11-19 DIAGNOSIS — E039 Hypothyroidism, unspecified: Secondary | ICD-10-CM | POA: Diagnosis not present

## 2021-11-19 DIAGNOSIS — E1169 Type 2 diabetes mellitus with other specified complication: Secondary | ICD-10-CM | POA: Diagnosis not present

## 2021-11-19 LAB — LDL CHOLESTEROL, DIRECT: Direct LDL: 139 mg/dL

## 2021-11-19 LAB — LIPID PANEL
Cholesterol: 230 mg/dL — ABNORMAL HIGH (ref 0–200)
HDL: 46.4 mg/dL (ref >39.00)
NonHDL: 183.75
Total CHOL/HDL Ratio: 5
Triglycerides: 309 mg/dL — ABNORMAL HIGH (ref 0.0–149.0)
VLDL: 61.8 mg/dL — ABNORMAL HIGH (ref 0.0–40.0)

## 2021-11-19 LAB — TSH: TSH: 1.79 u[IU]/mL (ref 0.35–5.50)

## 2021-11-19 LAB — POCT GLYCOSYLATED HEMOGLOBIN (HGB A1C): Hemoglobin A1C: 5.9 % — AB (ref 4.0–5.6)

## 2021-11-19 LAB — T4, FREE: Free T4: 0.84 ng/dL (ref 0.60–1.60)

## 2021-11-19 MED ORDER — NOVOLOG FLEXPEN 100 UNIT/ML ~~LOC~~ SOPN: PEN_INJECTOR | SUBCUTANEOUS | 3 refills | Status: DC

## 2021-11-19 MED ORDER — BD PEN NEEDLE NANO 2ND GEN 32G X 4 MM MISC: 4 refills | Status: DC

## 2021-11-19 NOTE — Patient Instructions (Addendum)
Please continue: - Ozempic 0.5 mg weekly in a.m.   Decrease: - Novolog 5-8 units before a larger meal - Tresiba U200 28 units daily  Please continue Levothyroxine 50 mcg daily.  Take the thyroid hormone every day, with water, at least 30 minutes before breakfast, separated by at least 4 hours from: - acid reflux medications - calcium - iron - multivitamins  Please return in 6 months.

## 2021-11-19 NOTE — Progress Notes (Addendum)
Patient ID: [name], male   DOB: 10-Sep-1955, 66 y.o.   MRN: [id number]   This visit occurred during the SARS-CoV-2 public health emergency.  Safety protocols were in place, including screening questions prior to the visit, additional usage of staff PPE, and extensive cleaning of exam room while observing appropriate contact time as indicated for disinfecting solutions.   HPI: [name] is a 66 y.o.-year-old male, initially referred by his PCP, Dr. Sharlet Salina, returning for follow-up for DM2, dx in 2017, insulin-dependent, uncontrolled, with complications (CHF, Afib/AFlutter, CKD, h/o calf diabetic ulcer).  Last visit 6 mo ago.  Interim history: He denies blurred vision, nausea.  He has nocturia, which is unchanged. He continues to play golf.  Reviewed HbA1c levels Lab Results  Component Value Date   HGBA1C 6.0 (A) 05/16/2021   HGBA1C 6.0 (A) 11/08/2020   HGBA1C 5.3 08/04/2020   HGBA1C 5.7 (A) 06/29/2020   HGBA1C 5.3 02/24/2020   HGBA1C 14.2 (H) 09/04/2019   HGBA1C 15.0 (H) 09/03/2019   HGBA1C 6.9 (H) 03/08/2019   HGBA1C 6.7 (H) 01/31/2019   HGBA1C 6.1 11/26/2017   In the past, he was on: - Levemir 40 units 2x a day - Novolog 5 units + SSI 3x a day, before meals: BG 70-120: 0 Units, BG 121-150:3 units, BG 151-200:4 Units, BG 201-250:7 units BG 251-300: 11 Units, 301-350 : 15 units. If BG >350: Call your primary care doctor He was on Glipizide since 2017.  Now on:  - Jardiance 10 mg before b'fast - Ozempic 0.5 mg weekly in a.m.  - Tresiba U200 66 >> 50 >> 40 >> 32 units daily - Novolog 8-12 units based on the size of the meal (8-12-8 units) >> 8-10 (12) before meals >> tried to stop but had to restart.  He checks his sugars more than 4 times a day with his freestyle libre CGM - from [facility]:  Previously:   Prev.:   Lowest sugar was  58 (delayed lunch) >> 78 >> 76 >> 68; he has hypoglycemia awareness in the 70s. Highest sugar was 330 >> 230 x1 >> 190 >> 199 >>  230. He wasadmitted in 08/2019 with hyperosmolar hyperglycemic nonketotic state, and was found to have a glucose of 1186, along with hyponatremia and AKI.  An HbA1c level was very high.  Glucometer: [facility]   Pt's meals are: - Breakfast: fruit >>  >> -  almond milk with bran flakes, berries, nuts - Lunch: salad - Dinner: salad, chili, hamburger patty, grilled chicken -moved dinner earlier - Snacks:- He saw nutrition in 2016.  -+ CKD, last BUN/creatinine:  Lab Results  Component Value Date   BUN 17 11/18/2021   BUN 19 10/07/2021   CREATININE 1.45 (H) 11/18/2021   CREATININE 1.43 (H) 10/07/2021  On Cozaar 50.  -+ HL; last set of lipids: Lab Results  Component Value Date   CHOL 143 05/16/2020   HDL 53.20 05/16/2020   LDLCALC 69 05/16/2020   LDLDIRECT 81.0 08/31/2015   TRIG 107.0 05/16/2020   CHOLHDL 3 05/16/2020  On Crestor 10.  - last eye exam was in 10/2020: No DR reportedly.  -No numbness and tingling in his feet.  He has a history of right calf ulcer- he required skin graft - 2020.  Last foot exam June 21, 2021.  Pt has no FH of DM.  Uncontrolled hypothyroidism:    Reviewed his TFTs: Lab Results  Component Value Date   TSH 3.280 04/23/2021   TSH 3.401 01/09/2021  TSH 3.52 06/29/2020   TSH 4.61 (H) 05/16/2020   TSH 33.362 (H) 09/03/2019   TSH 13.910 (H) 02/02/2019   TSH 25.834 (H) 01/31/2019   TSH 4.800 (H) 05/03/2018   TSH 3.39 11/26/2017   TSH 3.510 03/18/2017   He takes levothyroxine 50 mcg daily: - in am - fasting - now drinks black coffee - at least 30 min from b'fast - no calcium - no iron - + multivitamins later in the day - no PPIs - not on Biotin  He has a history of A. Fib and was found to have a thrombus in the left atrial appendage.  He had cardioversion in 08/2020.  He also has chronic systolic CHF.   He has a history of HTN, OSA.  Also, hereditary hemochromatosis, polycythemia. In the past he was drinking 4-5 beers every day.  Previously  was drinking hard liquor.  He stopped drinking alcohol in 2021.  ROS: + See HPI + joint aches  I reviewed pt's medications, allergies, PMH, social hx, family hx, and changes were documented in the history of present illness. Otherwise, unchanged from my initial visit note.  Past Medical History:  Diagnosis Date   Asthma    as a teenager - does not use an inhaler although pt said he has an albuteral inhaler   Atrial fibrillation (HCC)    persistant 02/2009   Atrial flutter ([place])    s/p CTI ablation 08/06/10   Cataract    Chronic rhinitis    Colon polyp    Congestive heart failure ([place])    Diverticulosis    colonoscopy 04/03/2009   Hyperlipidemia    Hypertension    Morbid obesity ([place])    target weight = 219  for BMI < 30   Nonischemic cardiomyopathy (HCC)    tachycardia mediated   Seasonal allergies    Sleep apnea    original 2001 - wears C-PaP   Type 2 diabetes mellitus ([place])    Past Surgical History:  Procedure Laterality Date   ATRIAL FIBRILLATION ABLATION N/A 12/14/2018   Procedure: ATRIAL FIBRILLATION ABLATION;  Surgeon: Thompson Grayer, MD;  Location: [place] [facility];  Service: Cardiovascular;  Laterality: N/A;   ATRIAL FIBRILLATION ABLATION N/A 10/18/2020   Procedure: ATRIAL FIBRILLATION ABLATION;  Surgeon: Thompson Grayer, MD;  Location: [place] [facility];  Service: Cardiovascular;  Laterality: N/A;   atrial flutter ablation  08/06/10   CARDIOVERSION N/A 07/05/2018   Procedure: CARDIOVERSION;  Surgeon: Buford Dresser, MD;  Location: [facility] ENDOSCOPY;  Service: Cardiovascular;  Laterality: N/A;   CARDIOVERSION N/A 08/08/2020   Procedure: CARDIOVERSION;  Surgeon: Larey Dresser, MD;  Location: [facility] ENDOSCOPY;  Service: Cardiovascular;  Laterality: N/A;   CARDIOVERSION N/A 08/29/2020   Procedure: CARDIOVERSION;  Surgeon: Jolaine Artist, MD;  Location: [facility];  Service: Cardiovascular;  Laterality: N/A;   COLONOSCOPY     POLYPECTOMY     RIGHT HEART CATH N/A  08/06/2020   Procedure: RIGHT HEART CATH;  Surgeon: Larey Dresser, MD;  Location: [place] [facility];  Service: Cardiovascular;  Laterality: N/A;   RIGHT/LEFT HEART CATH AND CORONARY ANGIOGRAPHY N/A 11/01/2021   Procedure: RIGHT/LEFT HEART CATH AND CORONARY ANGIOGRAPHY;  Surgeon: Larey Dresser, MD;  Location: [place] [facility];  Service: Cardiovascular;  Laterality: N/A;   TEE WITHOUT CARDIOVERSION N/A 12/14/2018   Procedure: TRANSESOPHAGEAL ECHOCARDIOGRAM (TEE);  Surgeon: Thompson Grayer, MD;  Location: [place] [facility];  Service: Cardiovascular;  Laterality: N/A;   TEE WITHOUT CARDIOVERSION  N/A 08/03/2020   Procedure: TRANSESOPHAGEAL ECHOCARDIOGRAM (TEE);  Surgeon: Sanda Klein, MD;  Location: [facility];  Service: Cardiovascular;  Laterality: N/A;   TEE WITHOUT CARDIOVERSION N/A 08/08/2020   Procedure: TRANSESOPHAGEAL ECHOCARDIOGRAM (TEE);  Surgeon: Larey Dresser, MD;  Location: [facility] ENDOSCOPY;  Service: Cardiovascular;  Laterality: N/A;   TEE WITHOUT CARDIOVERSION N/A 10/18/2020   Procedure: TRANSESOPHAGEAL ECHOCARDIOGRAM (TEE);  Surgeon: Jerline Pain, MD;  Location[facility] ENDOSCOPY;  Service: Cardiovascular;  Laterality: N/A;   Social History   Socioeconomic History   Marital status: Single    Spouse name: Not on file   Number of children: 0   Years of education: Not on file   Highest education level: Not on file  Occupational History   Occupation: Sales  Tobacco Use   Smoking status: Never   Smokeless tobacco: Never  Vaping Use   Vaping Use: Never used  Substance and Sexual Activity   Alcohol use: Yes    Alcohol/week: 21.0 standard drinks    Types: 21 Cans of beer per week    Comment: previously quite heavy, working on cessation    Drug use: No   Sexual activity: Not on file  Other Topics Concern   Not on file  Social History Narrative   Lives in [place]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   Social Determinants of Health   Financial Resource Strain: Low Risk     Difficulty of Paying Living Expenses: Not hard at all  Food Insecurity: No Food Insecurity   Worried About Charity fundraiser in the Last Year: Never true   Arboriculturist in the Last Year: Never true  Transportation Needs: No Transportation Needs   Lack of Transportation (Medical): No   Lack of Transportation (Non-Medical): No  Physical Activity: Sufficiently Active   Days of Exercise per Week: 5 days   Minutes of Exercise per Session: 30 min  Stress: No Stress Concern Present   Feeling of Stress : Not at all  Social Connections: Unknown   Frequency of Communication with Friends and Family: More than three times a week   Frequency of Social Gatherings with Friends and Family: Twice a week   Attends Religious Services: Patient refused   Marine scientist or Organizations: Patient refused   Attends Music therapist: Patient refused   Marital Status: Never married  Human resources officer Violence: Not At Risk   Fear of Current or Ex-Partner: No   Emotionally Abused: No   Physically Abused: No   Sexually Abused: No   Current Outpatient Medications on File Prior to Visit  Medication Sig Dispense Refill   albuterol (PROVENTIL HFA;VENTOLIN HFA) 108 (90 Base) MCG/ACT inhaler Inhale 2 puffs into the lungs every 6 (six) hours as needed for wheezing or shortness of breath.     BD PEN NEEDLE NANO 2ND GEN 32G X 4 MM MISC USE 4 TIMES A DAY AS DIRECTED     BD PEN NEEDLE NANO 2ND GEN 32G X 4 MM MISC USE 4 TIMES A DAY AS DIRECTED 100 each 4   budesonide-formoterol (SYMBICORT) 80-4.5 MCG/ACT inhaler Inhale 2 puffs into the lungs daily as needed (Shortness of breath).     carvedilol (COREG) 12.5 MG tablet Take 1.5 tablets (18.75 mg total) by mouth 2 (two) times daily with a meal. 270 tablet 3   cholecalciferol (VITAMIN D3) 25 MCG (1000 UNIT) tablet Take 1,000 Units by mouth daily.     Continuous Blood Gluc Receiver (FREESTYLE LIBRE 14  DAY READER) DEVI USE AS DIRECTED 1 each 11    doxylamine, Sleep, (UNISOM) 25 MG tablet Take 25 mg by mouth at bedtime.     empagliflozin (JARDIANCE) 10 MG TABS tablet Take 1 tablet (10 mg total) by mouth daily. 30 tablet 5   furosemide (LASIX) 20 MG tablet Take 0.5 tablets (10 mg total) by mouth 2 (two) times daily. 30 tablet 2   Insulin Degludec (TRESIBA FLEXTOUCH [place]) Inject 32 Units into the skin daily.     levothyroxine (SYNTHROID) 50 MCG tablet TAKE 1 TABLET BY MOUTH EVERY DAY BEFORE BREAKFAST 90 tablet 2   mometasone (NASONEX) 50 MCG/ACT nasal spray Place 2 sprays into the nose daily as needed (allergies or rhinitis).     Multiple Vitamins-Minerals (MULTIVITAMIN WITH MINERALS) tablet Take 2 tablets by mouth at bedtime. Chewable     NOVOLOG FLEXPEN 100 UNIT/ML FlexPen INJECT 8-12 UNITS INTO THE SKIN 3 (THREE) TIMES DAILY WITH MEALS. 30 mL 3   sacubitril-valsartan (ENTRESTO) 97-103 MG Take 1 tablet by mouth 2 (two) times daily. 180 tablet 3   Semaglutide,0.25 or 0.'5MG'$ /DOS, (OZEMPIC, 0.25 OR 0.5 MG/DOSE,) 2 MG/3ML SOPN Inject 0.5 mg into the skin once a week. 9 mL 1   spironolactone (ALDACTONE) 25 MG tablet TAKE 1 TABLET BY MOUTH EVERY DAY 90 tablet 1   vitamin C (ASCORBIC ACID) 500 MG tablet Take 500 mg by mouth in the morning.     warfarin (COUMADIN) 5 MG tablet TAKE 1 TABLET TO 1 AND 1/2 TABLETS BY MOUTH DAILY AS DIRECTED BY THE COUMADIN CLINIC. 135 tablet 1   No current facility-administered medications on file prior to visit.   Not on File Family History  Problem Relation Age of Onset   Hypertension Mother    Kidney failure Mother        Due to sepsis   COPD Father    Colon cancer Brother        dx in 47's   Esophageal cancer Neg Hx    Rectal cancer Neg Hx    Stomach cancer Neg Hx     PE: BP 100/70 (BP Location: Right Arm, Patient Position: Sitting, Cuff Size: Normal)   Pulse 64   Ht '6\' 1"'$  (1.854 m)   Wt 266 lb 12.8 oz (121 kg)   SpO2 94%   BMI 35.20 kg/m   Wt Readings from Last 3 Encounters:  11/19/21 266 lb 12.8  oz (121 kg)  11/18/21 265 lb 3.2 oz (120.3 kg)  11/08/21 267 lb 12.8 oz (121.5 kg)   Constitutional: overweight, in NAD Eyes: EOMI, no exophthalmos ENT: moist mucous membranes, no thyromegaly, no cervical lymphadenopathy Cardiovascular: RRR, No MRG Respiratory: CTA B Musculoskeletal: no deformities Skin: moist, warm, no rashes Neurological: + tremor with outstretched hands  ASSESSMENT: 1. DM2, insulin-dependent, uncontrolled, with complications - CHF - A. fib/a flutter - CKD - h/o Diabetic ulcer of calf - h/o HHNK - admitted with a glucose of >1000  -started on basal bolus regimen in the hospital  2.  Hypothyroidism - Uncontrolled  3. Obesity class 2  4. HL  PLAN:  1. Patient with longstanding, uncontrolled, type 2 diabetes, insulin-dependent, on basal bolus insulin regimen and weekly GLP-1 receptor agonist, which dramatic improvement in diabetes control after stopping alcohol in 2021.  HbA1c decreased from 14.2% to 5.3%.  Afterwards, HbA1c increased, but never to the previous value.  At last visit, reviewing his CGM trends, sugars are excellent, almost all at goal without significant hypo or hyperglycemic  exceptions.  We did not change the regimen.  However, since then, he tried to come off NovoLog but had to restart due to higher blood sugars. HbA1c obtained last visit was 6.0%, stable. CGM interpretation: -At today's visit, we reviewed his CGM downloads: It appears that 94% of values are in target range (goal >70%), while 4% are higher than 180 (goal <25%), and 2% are lower than 70 (goal <4%).  The calculated average blood sugar is 122.  The projected HbA1c for the next 3 months (GMI) is 6.2%. -Reviewing the CGM trends, sugars appear to vary mostly in the lower part of the target interval with only higher blood sugars after breakfast, especially when he eats fruit.  He mentions that his sugars initially worsened after stopping NovoLog, but then they improved when he went developed  lows.  We discussed that he may only need NovoLog with larger meals.  Since he does have occasional low blood sugars at night and between meals, will lower the [place] dose today.Will continue Ozempic. - I suggested to:  Patient Instructions  Please continue: - Ozempic 0.5 mg weekly in a.m.   Decrease: - Novolog 5-8 units before a larger meal - Tresiba U200 28 units daily  Please continue Levothyroxine 50 mcg daily.  Take the thyroid hormone every day, with water, at least 30 minutes before breakfast, separated by at least 4 hours from: - acid reflux medications - calcium - iron - multivitamins  Please return in 6 months.   - we checked his HbA1c: 5.9% (lower) - advised to check sugars at different times of the day - 4x a day, rotating check times - advised for yearly eye exams >> he is not UTD - return to clinic in 6 months  2.  Hypothyroidism - latest thyroid labs reviewed with pt. >> normal: Lab Results  Component Value Date   TSH 3.280 04/23/2021  - he continues on LT4 50 mcg daily - pt feels good on this dose. - we discussed about taking the thyroid hormone every day, with water, >30 minutes before breakfast, separated by >4 hours from acid reflux medications, calcium, iron, multivitamins. Pt. is taking it correctly. - will check thyroid tests today: TSH and fT4 - If labs are abnormal, he will need to return for repeat TFTs in 1.5 months  3. Obesity class 2 -We will continue the GLP-1 receptor agonist which should also help with weight loss -He lost more than 20 pounds last year after changing her diet, and before last visit, he lost 6 more -he gained 5 lbs since last visit  4. HL -Reviewed latest lipid panel from 05/2020: LDL above our target of less than 55 due to history of cardiovascular disease, the rest of the fractions at goal: Lab Results  Component Value Date   CHOL 143 05/16/2020   HDL 53.20 05/16/2020   LDLCALC 69 05/16/2020   LDLDIRECT 81.0 08/31/2015    TRIG 107.0 05/16/2020   CHOLHDL 3 05/16/2020  -He continues Crestor 10 mg daily without side effects -He is due for another lipid panel - will check today  Component     Latest Ref Rng 11/19/2021  TSH     0.35 - 5.50 uIU/mL 1.79   Hemoglobin A1C     4.0 - 5.6 % 5.9 !   T4,Free(Direct)     0.60 - 1.60 ng/dL 0.84   Cholesterol     0 - 200 mg/dL 230 (H)   Triglycerides     0.0 -  149.0 mg/dL 309.0 (H)   HDL Cholesterol     >39.00 mg/dL 46.40   VLDL     0.0 - 40.0 mg/dL 61.8 (H)   Total CHOL/HDL Ratio 5   NonHDL 183.75   Direct LDL     mg/dL 139.0    Lipid panel is much worse, with LDL increased from 69 to 139.  Triglycerides are also high - Increased from 107 to 309.  I will check with him if he is taking Crestor daily.  If so, we will increase the dose to 20 mg daily.  TSH is at goal.  Philemon Kingdom, MD PhD [facility] Endocrinology

## 2021-11-20 ENCOUNTER — Other Ambulatory Visit: Payer: Self-pay | Admitting: Internal Medicine

## 2021-11-20 ENCOUNTER — Encounter: Payer: Self-pay | Admitting: Internal Medicine

## 2021-11-20 MED ORDER — ROSUVASTATIN CALCIUM 10 MG PO TABS: 10.0000 mg | ORAL_TABLET | Freq: Every day | ORAL | 3 refills | Status: DC

## 2021-11-26 ENCOUNTER — Other Ambulatory Visit (HOSPITAL_COMMUNITY): Payer: Self-pay | Admitting: Cardiology

## 2021-12-04 ENCOUNTER — Ambulatory Visit (INDEPENDENT_AMBULATORY_CARE_PROVIDER_SITE_OTHER): Payer: Medicare HMO | Admitting: *Deleted

## 2021-12-04 DIAGNOSIS — I4891 Unspecified atrial fibrillation: Secondary | ICD-10-CM

## 2021-12-04 DIAGNOSIS — I513 Intracardiac thrombosis, not elsewhere classified: Secondary | ICD-10-CM

## 2021-12-04 LAB — POCT INR: INR: 3.8 — AB (ref 2.0–3.0)

## 2021-12-04 NOTE — Patient Instructions (Signed)
Description   Do not take take any Warfarin today hen start taking 1.5 tablets daily except 1 tablet on Mondays, Wednesdays, and Fridays. Recheck INR in 2 weeks. [facility] [phone].

## 2021-12-06 ENCOUNTER — Telehealth: Payer: Self-pay | Admitting: Internal Medicine

## 2021-12-06 NOTE — Telephone Encounter (Signed)
Patient is requesting to talk with Dr. Ladona Ridgel or nurse about ICD in hospital

## 2021-12-09 NOTE — Telephone Encounter (Signed)
Duplicate message. 

## 2021-12-09 NOTE — Telephone Encounter (Signed)
Pt scheduled for ICD implant January 13, 2022 at 7:30 am.

## 2021-12-12 DIAGNOSIS — Z794 Long term (current) use of insulin: Secondary | ICD-10-CM | POA: Diagnosis not present

## 2021-12-12 DIAGNOSIS — H43393 Other vitreous opacities, bilateral: Secondary | ICD-10-CM | POA: Diagnosis not present

## 2021-12-12 DIAGNOSIS — H524 Presbyopia: Secondary | ICD-10-CM | POA: Diagnosis not present

## 2021-12-12 DIAGNOSIS — H25813 Combined forms of age-related cataract, bilateral: Secondary | ICD-10-CM | POA: Diagnosis not present

## 2021-12-12 DIAGNOSIS — E119 Type 2 diabetes mellitus without complications: Secondary | ICD-10-CM | POA: Diagnosis not present

## 2021-12-12 DIAGNOSIS — H11153 Pinguecula, bilateral: Secondary | ICD-10-CM | POA: Diagnosis not present

## 2021-12-12 NOTE — Telephone Encounter (Signed)
Work up complete. 

## 2021-12-20 ENCOUNTER — Encounter (HOSPITAL_BASED_OUTPATIENT_CLINIC_OR_DEPARTMENT_OTHER): Payer: Self-pay | Admitting: Cardiology

## 2021-12-20 ENCOUNTER — Ambulatory Visit (HOSPITAL_BASED_OUTPATIENT_CLINIC_OR_DEPARTMENT_OTHER): Payer: Medicare HMO | Admitting: Cardiology

## 2021-12-20 VITALS — BP 96/68 | HR 60 | Ht 73.0 in | Wt 264.8 lb

## 2021-12-20 DIAGNOSIS — Z01812 Encounter for preprocedural laboratory examination: Secondary | ICD-10-CM

## 2021-12-20 DIAGNOSIS — Z79899 Other long term (current) drug therapy: Secondary | ICD-10-CM

## 2021-12-20 DIAGNOSIS — E785 Hyperlipidemia, unspecified: Secondary | ICD-10-CM

## 2021-12-20 DIAGNOSIS — I5022 Chronic systolic (congestive) heart failure: Secondary | ICD-10-CM | POA: Diagnosis not present

## 2021-12-20 DIAGNOSIS — I428 Other cardiomyopathies: Secondary | ICD-10-CM | POA: Diagnosis not present

## 2021-12-20 DIAGNOSIS — H5203 Hypermetropia, bilateral: Secondary | ICD-10-CM | POA: Diagnosis not present

## 2021-12-20 DIAGNOSIS — D6869 Other thrombophilia: Secondary | ICD-10-CM | POA: Diagnosis not present

## 2021-12-20 DIAGNOSIS — I480 Paroxysmal atrial fibrillation: Secondary | ICD-10-CM | POA: Diagnosis not present

## 2021-12-20 DIAGNOSIS — H524 Presbyopia: Secondary | ICD-10-CM | POA: Diagnosis not present

## 2021-12-20 DIAGNOSIS — Z7901 Long term (current) use of anticoagulants: Secondary | ICD-10-CM | POA: Diagnosis not present

## 2021-12-20 DIAGNOSIS — E1169 Type 2 diabetes mellitus with other specified complication: Secondary | ICD-10-CM

## 2021-12-20 DIAGNOSIS — H52209 Unspecified astigmatism, unspecified eye: Secondary | ICD-10-CM | POA: Diagnosis not present

## 2021-12-20 NOTE — Patient Instructions (Signed)
Medication Instructions:  Your Physician recommend you continue on your current medication as directed.    *If you need a refill on your cardiac medications before your next appointment, please call your pharmacy*   Lab Work: Your provider has recommended lab work (BMP, CBC, INR). Please have this collected at [facility] [place]. The lab is open 8:00 am - 4:30 pm. Please avoid 12:00p - 1:00p for lunch hour. You do not need an appointment. Please go to [address] [place], [place] [address]. This is in the Primary Care office on the 3rd floor, let them know you are there for blood work and they will direct you to the lab.  If you have labs (blood work) drawn today and your tests are completely normal, you will receive your results only by: [place] (if you have MyChart) OR A paper copy in the mail If you have any lab test that is abnormal or we need to change your treatment, we will call you to review the results.   Testing/Procedures: None ordered today   Follow-Up: At [facility], you and your health needs are our priority.  As part of our continuing mission to provide you with exceptional heart care, we have created designated Provider Care Teams.  These Care Teams include your primary Cardiologist (physician) and Advanced Practice Providers (APPs -  Physician Assistants and Nurse Practitioners) who all work together to provide you with the care you need, when you need it.  We recommend signing up for the patient portal called "MyChart".  Sign up information is provided on this After Visit Summary.  MyChart is used to connect with patients for Virtual Visits (Telemedicine).  Patients are able to view lab/test results, encounter notes, upcoming appointments, etc.  Non-urgent messages can be sent to your provider as well.   To learn more about what you can do with MyChart, go to [email or web address].    Your next appointment:   6 month(s)  The  format for your next appointment:   In Person  Provider:   Buford Dresser, MD{

## 2021-12-20 NOTE — Progress Notes (Signed)
Cardiology Office Note:    Date:  12/20/2021   ID:  [name], DOB 10-01-1955, MRN [id number]  PCP:  [name], MD  Cardiologist:  Buford Dresser, MD PhD  Referring MD: [name], *   CC: follow up  History of Present Illness:    [name] is a 66 y.o. male with a hx of persistent atrial fibrillation, atrial flutter with prior ablation in 2012, prior cardioversions, HTN, chronic systolic heart failure (thought to be tachycardia induced) with EF 30-35% on tte 04/2018, OSA who is seen in follow up today. I first saw him as a new consult 12/28/18.  At his last appointment, he was doing great and had no limitations to physical activity. He had shortness of breath at rest or with normal exertion. His echo was reviewed and he was using his CPAP.   Today, he is doing well.   He has been playing golf a couple times a week.  He will both walk and use the golf cart.  He makes sure to stay hydrated with water and gatorade.  His sugars have been okay. His BP today is 96/68 and has been a little low. Usually his BP is around 100-110/70's. He is asymptomatic.  Recently he was started on Crestor, which he is tolerating so far. No bleeding issues on Coumadin.  He denies any palpitations, chest pain, shortness of breath, or peripheral edema. No lightheadedness, headaches, syncope, orthopnea, or PND.  Of note, he will see a geneticist in August. He has followed with Dr. 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 cMRI with scar. Recommended for ICD, planned later this month.    Past Medical History:  Diagnosis Date   Asthma    as a teenager - does not use an inhaler although pt said he has an albuteral inhaler   Atrial fibrillation (HCC)    persistant 02/2009   Atrial flutter ([place])    s/p CTI ablation 08/06/10   Cataract    Chronic rhinitis    Colon polyp    Congestive heart failure  ([place])    Diverticulosis    colonoscopy 04/03/2009   Hyperlipidemia    Hypertension    Morbid obesity ([place])    target weight = 219  for BMI < 30   Nonischemic cardiomyopathy (HCC)    tachycardia mediated   Seasonal allergies    Sleep apnea    original 2001 - wears C-PaP   Type 2 diabetes mellitus ([place])     Past Surgical History:  Procedure Laterality Date   ATRIAL FIBRILLATION ABLATION N/A 12/14/2018   Procedure: ATRIAL FIBRILLATION ABLATION;  Surgeon: Thompson Grayer, MD;  Location: [place] [facility];  Service: Cardiovascular;  Laterality: N/A;   ATRIAL FIBRILLATION ABLATION N/A 10/18/2020   Procedure: ATRIAL FIBRILLATION ABLATION;  Surgeon: Thompson Grayer, MD;  Location: [place] [facility];  Service: Cardiovascular;  Laterality: N/A;   atrial flutter ablation  08/06/10   CARDIOVERSION N/A 07/05/2018   Procedure: CARDIOVERSION;  Surgeon: Buford Dresser, MD;  Location: [facility] ENDOSCOPY;  Service: Cardiovascular;  Laterality: N/A;   CARDIOVERSION N/A 08/08/2020   Procedure: CARDIOVERSION;  Surgeon: Larey Dresser, MD;  Location: [facility] ENDOSCOPY;  Service: Cardiovascular;  Laterality: N/A;   CARDIOVERSION N/A 08/29/2020   Procedure: CARDIOVERSION;  Surgeon: Jolaine Artist, MD;  Location: [facility] ENDOSCOPY;  Service: Cardiovascular;  Laterality: N/A;   COLONOSCOPY     POLYPECTOMY  RIGHT HEART CATH N/A 08/06/2020   Procedure: RIGHT HEART CATH;  Surgeon: Larey Dresser, MD;  Location: [place] [facility];  Service: Cardiovascular;  Laterality: N/A;   RIGHT/LEFT HEART CATH AND CORONARY ANGIOGRAPHY N/A 11/01/2021   Procedure: RIGHT/LEFT HEART CATH AND CORONARY ANGIOGRAPHY;  Surgeon: Larey Dresser, MD;  Location: [place] [facility];  Service: Cardiovascular;  Laterality: N/A;   TEE WITHOUT CARDIOVERSION N/A 12/14/2018   Procedure: TRANSESOPHAGEAL ECHOCARDIOGRAM (TEE);  Surgeon: Thompson Grayer, MD;  Location: [place] [facility];  Service: Cardiovascular;  Laterality: N/A;   TEE WITHOUT  CARDIOVERSION N/A 08/03/2020   Procedure: TRANSESOPHAGEAL ECHOCARDIOGRAM (TEE);  Surgeon: Sanda Klein, MD;  Location: [place];  Service: Cardiovascular;  Laterality: N/A;   TEE WITHOUT CARDIOVERSION N/A 08/08/2020   Procedure: TRANSESOPHAGEAL ECHOCARDIOGRAM (TEE);  Surgeon: Larey Dresser, MD;  Location: [facility] ENDOSCOPY;  Service: Cardiovascular;  Laterality: N/A;   TEE WITHOUT CARDIOVERSION N/A 10/18/2020   Procedure: TRANSESOPHAGEAL ECHOCARDIOGRAM (TEE);  Surgeon: Jerline Pain, MD;  Location: [facility] ENDOSCOPY;  Service: Cardiovascular;  Laterality: N/A;    Current Medications: Current Outpatient Medications on File Prior to Visit  Medication Sig   albuterol (PROVENTIL HFA;VENTOLIN HFA) 108 (90 Base) MCG/ACT inhaler Inhale 2 puffs into the lungs every 6 (six) hours as needed for wheezing or shortness of breath.   budesonide-formoterol (SYMBICORT) 80-4.5 MCG/ACT inhaler Inhale 2 puffs into the lungs daily as needed (Shortness of breath).   carvedilol (COREG) 12.5 MG tablet Take 1.5 tablets (18.75 mg total) by mouth 2 (two) times daily with a meal.   cholecalciferol (VITAMIN D3) 25 MCG (1000 UNIT) tablet Take 1,000 Units by mouth daily.   Continuous Blood Gluc Receiver (FREESTYLE LIBRE 14 DAY READER) DEVI USE AS DIRECTED   doxylamine, Sleep, (UNISOM) 25 MG tablet Take 25 mg by mouth at bedtime.   empagliflozin (JARDIANCE) 10 MG TABS tablet Take 1 tablet (10 mg total) by mouth daily.   furosemide (LASIX) 20 MG tablet Take 0.5 tablets (10 mg total) by mouth 2 (two) times daily.   insulin aspart (NOVOLOG FLEXPEN) 100 UNIT/ML FlexPen INJECT up to 8 UNITS INTO THE SKIN 1-3 TIMES DAILY BEFORE MEALS.   Insulin Degludec (TRESIBA FLEXTOUCH [place]) Inject 28 Units into the skin daily.   Insulin Pen Needle (BD PEN NEEDLE NANO 2ND GEN) 32G X 4 MM MISC USE 2-4 TIMES A DAY AS DIRECTED   levothyroxine (SYNTHROID) 50 MCG tablet TAKE 1 TABLET BY MOUTH EVERY DAY BEFORE BREAKFAST   mometasone (NASONEX) 50 MCG/ACT  nasal spray Place 2 sprays into the nose daily as needed (allergies or rhinitis).   Multiple Vitamins-Minerals (MULTIVITAMIN WITH MINERALS) tablet Take 2 tablets by mouth at bedtime. Chewable   rosuvastatin (CRESTOR) 10 MG tablet Take 1 tablet (10 mg total) by mouth daily.   sacubitril-valsartan (ENTRESTO) 97-103 MG Take 1 tablet by mouth 2 (two) times daily.   Semaglutide,0.25 or 0.'5MG'$ /DOS, (OZEMPIC, 0.25 OR 0.5 MG/DOSE,) 2 MG/3ML SOPN Inject 0.5 mg into the skin once a week.   spironolactone (ALDACTONE) 25 MG tablet TAKE 1 TABLET BY MOUTH EVERY DAY   vitamin C (ASCORBIC ACID) 500 MG tablet Take 500 mg by mouth in the morning.   warfarin (COUMADIN) 5 MG tablet TAKE 1 TABLET TO 1 AND 1/2 TABLETS BY MOUTH DAILY AS DIRECTED BY THE COUMADIN CLINIC.   No current facility-administered medications on file prior to visit.     Allergies:   Patient has no known allergies.   Social History  Tobacco Use   Smoking status: Never   Smokeless tobacco: Never  Vaping Use   Vaping Use: Never used  Substance Use Topics   Alcohol use: Yes    Alcohol/week: 21.0 standard drinks of alcohol    Types: 21 Cans of beer per week    Comment: previously quite heavy, working on cessation    Drug use: No     Family History: The patient's family history includes COPD in his father; Colon cancer in his brother; Hypertension in his mother; Kidney failure in his mother. There is no history of Esophageal cancer, Rectal cancer, or Stomach cancer.  ROS:   Please see the history of present illness.   Additional pertinent ROS otherwise unremarkable.  EKGs/Labs/Other Studies Reviewed:    The following studies were reviewed today:  Right/Left Heart Cath  11/01/2021:   Mid Cx lesion is 25% stenosed.   Prox RCA lesion is 25% stenosed.   1. Mild nonobstructive CAD.  2. Normal filling pressures.  3. Low but not markedly low cardiac output.    Nonischemic cardiomyopathy, proceed with ICD.   Diagnostic: Dominance:  Right    Echo 04/23/21:  1. Left ventricular ejection fraction, by estimation, is 30 to 35%. The  left ventricle has moderately decreased function. The left ventricle  demonstrates global hypokinesis. The left ventricular internal cavity size  was mildly dilated. There is severe  asymmetric septal left ventricular hypertrophy. No LV outflow tract  gradient or mitral valve systolic anterior motion. Left ventricular  diastolic parameters are consistent with Grade I diastolic dysfunction  (impaired relaxation).   2. Right ventricular systolic function is mildly reduced. The right  ventricular size is mildly enlarged. There is normal pulmonary artery  systolic pressure. The estimated right ventricular systolic pressure is  11.9 mmHg.   3. Left atrial size was moderately dilated.   4. The mitral valve is normal in structure. Mild mitral valve  regurgitation. No evidence of mitral stenosis.   5. The aortic valve is tricuspid. Aortic valve regurgitation is not  visualized. Mild aortic valve sclerosis is present, with no evidence of  aortic valve stenosis.   6. Aortic dilatation noted. There is mild dilatation of the aortic root,  measuring 38 mm.   7. The inferior vena cava is normal in size with greater than 50%  respiratory variability, suggesting right atrial pressure of 3 mmHg.    Echo 01/09/21 1. Left ventricular ejection fraction, by estimation, is 40 to 45%. The  left ventricle has mildly decreased function. The left ventricle  demonstrates global hypokinesis. There is moderate left ventricular  hypertrophy. Left ventricular diastolic  parameters are consistent with Grade I diastolic dysfunction (impaired  relaxation).   2. Right ventricular systolic function is normal. The right ventricular  size is normal. Tricuspid regurgitation signal is inadequate for assessing  PA pressure.   3. Left atrial size was moderately dilated.   4. Right atrial size was moderately dilated.   5.  The mitral valve is abnormal. Moderate mitral valve regurgitation. No  evidence of mitral stenosis.   6. The aortic valve is tricuspid. Aortic valve regurgitation is not  visualized. Mild aortic valve sclerosis is present, with no evidence of  aortic valve stenosis.   7. Aortic dilatation noted. There is mild dilatation of the aortic root,  measuring 42 mm.   Atrial Fibrillation Ablation 10/18/2020: CONCLUSIONS: 1. Atrial fibrillation upon presentation.   2. Intracardiac echo reveals a large sized left atrium with four separate pulmonary veins without  evidence of pulmonary vein stenosis. 3. Successful electrical isolation and anatomical encircling of all four pulmonary veins with radiofrequency current.  A WACA approach was used 3. Additional left atrial ablation was performed with a standard box lesion created along the posterior wall of the left atrium 4. Atrial fibrillation successfully cardioverted to sinus rhythm. 5. No early apparent complications.   Additional instructions: The patient takes coumadin for stroke prevention.  We will resume coumadin this evening.  The patient should not interrupt oral anticoagulation for any reason for 3 months post ablation.  Right Heart Cath 08/06/2020: 1. Filling pressures remain elevated. 2. Cardiac output is low even on milrinone 0.25.  TTE 09/04/19 1. Left ventricular ejection fraction, by estimation, is 40 to 45%. The  left ventricle has mildly decreased function. The left ventricle  demonstrates global hypokinesis. There is severe left ventricular  hypertrophy, asymmetric and septal predominant  (parasternal views of the septum raise question of HOCM or infiltrative  cardiomyopathy - cardiac MRI could be considered if clinically  appropriate). Left ventricular diastolic parameters are consistent with  Grade I diastolic dysfunction (impaired  relaxation).   2. Right ventricular systolic function is normal. The right ventricular  size is  normal. Tricuspid regurgitation signal is inadequate for assessing  PA pressure.   3. Left atrial size was mildly dilated.   4. The mitral valve is grossly normal. Trivial mitral valve  regurgitation.   5. The aortic valve was not well visualized. Aortic valve regurgitation  is not visualized.   6. Aortic dilatation noted. There is mild dilatation of the ascending  aorta.   7. The inferior vena cava is normal in size with greater than 50%  respiratory variability, suggesting right atrial pressure of 3 mmHg.   Lexiscan 04/07/19 Nuclear stress EF: 49%. The left ventricular ejection fraction is mildly decreased (45-54%). No T wave inversion was noted during stress. There was no ST segment deviation noted during stress. Defect 1: There is a small defect of mild severity. This is an intermediate risk study.   Small size, mild intensity fixed apical perfusion defect, likely thinning. No significant reversible ischemia. Dilated LV with mild hypokinesis - LVEF 49%. This is an intermediate risk study due to decreased LV function.  TTE 05/03/18 Left ventricle: The cavity size was normal. There was severe   concentric hypertrophy. Systolic function was moderately to   severely reduced. The estimated ejection fraction was in the   range of 30% to 35%. Diffuse hypokinesis. The study was not   technically sufficient to allow evaluation of LV diastolic   dysfunction due to atrial fibrillation. - Aortic valve: There was no regurgitation. - Aortic root: The aortic root was normal in size. - Mitral valve: There was trivial regurgitation. - Left atrium: The atrium was severely dilated. - Right ventricle: Systolic function was normal. - Right atrium: The atrium was moderately dilated. - Tricuspid valve: There was trivial regurgitation. - Pulmonic valve: Transvalvular velocity was within the normal   range. There was no evidence for stenosis. - Pulmonary arteries: Systolic pressure was within the  normal   range. - Inferior vena cava: The vessel was normal in size. The   respirophasic diameter changes were in the normal range (= 50%),   consistent with normal central venous pressure. - Pericardium, extracardiac: There was no pericardial effusion.   Impressions:  - When compared to the prior study from 04/02/2017 LVEF has   decreased from 55-60% to 30-35% with diffuse hypokinesis.  TEE 12/14/18  1. The left ventricle has moderate-severely reduced systolic function, with an ejection fraction of 30-35%. The cavity size was moderately dilated. Left ventrical global hypokinesis without regional wall motion abnormalities.  2. The right ventricle has normal systolc function. The cavity was normal. There is no increase in right ventricular wall thickness.  3. Left atrial size was severely dilated.  4. Dense smoke in atria and appendage. Stagnant pre thrombus and likely thrombus in mid to distal LAA High risk for embolic event EP procedure cancelled.  5. Right atrial size was moderately dilated.  6. The mitral valve is grossly normal. Mild thickening of the mitral valve leaflet. Mild calcification of the mitral valve leaflet. Mitral valve regurgitation is mild to moderate by color flow Doppler.  7. Restricted posterior leaflet motion.  8. The aortic valve is tricuspid Moderate thickening of the aortic valve Sclerosis without any evidence of stenosis of the aortic valve. Aortic valve regurgitation is trivial by color flow Doppler.  9. The aortic root is normal in size and structure.  EKG:   Personally reviewed today. 12/20/2021: EKG was not ordered. 04/10/21 SR, 1st degree AV block, IVCD 4/22-Atrial fibrillation, Rate 67 bpm 2/22- Atrial fibrillation with RVR at 123 bpm, IVCD  Recent Labs: 08/21/2021: ALT 22 10/07/2021: B Natriuretic Peptide 160.8; Hemoglobin 18.9; Platelets 198 11/18/2021: BUN 17; Creatinine, Ser 1.45; Potassium 4.2; Sodium 135 11/19/2021: TSH 1.79  Recent Lipid Panel     Component Value Date/Time   CHOL 230 (H) 11/19/2021 1319   TRIG 309.0 (H) 11/19/2021 1319   HDL 46.40 11/19/2021 1319   CHOLHDL 5 11/19/2021 1319   VLDL 61.8 (H) 11/19/2021 1319   LDLCALC 69 05/16/2020 1035   LDLDIRECT 139.0 11/19/2021 1319    Physical Exam:    VS:  BP 96/68 (BP Location: Right Arm, Patient Position: Sitting, Cuff Size: Normal)   Pulse 60   Ht '6\' 1"'$  (1.854 m)   Wt 264 lb 12.8 oz (120.1 kg)   SpO2 98%   BMI 34.94 kg/m     Wt Readings from Last 3 Encounters:  12/20/21 264 lb 12.8 oz (120.1 kg)  11/19/21 266 lb 12.8 oz (121 kg)  11/18/21 265 lb 3.2 oz (120.3 kg)  GEN: Well nourished, well developed in no acute distress HEENT: Normal, moist mucous membranes NECK: No JVD CARDIAC: regular rhythm, normal S1 and S2, no rubs or gallops. No murmur. VASCULAR: Radial and DP pulses 2+ bilaterally. No carotid bruits RESPIRATORY:  Clear to auscultation without rales, wheezing or rhonchi  ABDOMEN: Soft, non-tender, non-distended MUSCULOSKELETAL:  Ambulates independently SKIN: Warm and dry, no edema NEUROLOGIC:  Alert and oriented x 3. No focal neuro deficits noted. PSYCHIATRIC:  Normal affect    ASSESSMENT:    1. Pre-procedure lab exam   2. Medication management   3. Paroxysmal atrial fibrillation (HCC)   4. NICM (nonischemic cardiomyopathy) ([place])   5. Chronic systolic heart failure ([place])   6. Type 2 diabetes mellitus with hyperlipidemia ([place])   7. Secondary hypercoagulable state ([place])   8. Anticoagulated on Coumadin       PLAN:    History of atrial fibrillation and atrial flutter, s/p prior cardioversions and CTI -sinus rhythm today -CHADSVASC=3 -Has OSA, encouraged adherence to CPAP -continue coumadin given LAA thrombus with both rivaroxaban and dabigatran.  Nonischemic cardiomyopathy, with chronic systolic and diastolic heart failure:  -euvolemic, NYHA class I today -follows with Dr. Aundra Dubin -does have prior history of hospitalization for heart  failure requiring milrinone -Lexiscan done, no reversible  ischemia. Have not been able to do coronary angiography due to renal disease -appreciate heart failure clinic assistance -continue carvedilol, empagliflozin, entresto, spironolactone, semaglutide -genetic testing with  -on GLP1RA and SGLT2i given diabetes. Followed by Dr. Renne Crigler in endocrinology. 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 Has upcoming appt with Dr. Broadus John next month. Also heterozygous for hemochromatosis -recommended for ICD, planned later this month  Nonobstructive CAD -continue rosuvastatin -no aspirin as he is on coumadin  Medication history: -amiodarone and carvedilol were stopped during admission 08/2019 due to symptomatic bradycardia, in the setting of hyperosmolar hyperglycemia. Monitor for bradycardia on these meds -came off hydralazine 50 mg BID, imdur 30 mg daily due to hypotension. Monitor for hypotension  Type II diabetes with hyperlipidemia: -on semaglutide (GLP1RA), empagliflozin (SGLT2i) and insulin -no aspirin as he is on coumadin  Cardiac risk counseling and prevention recommendations: -recommend heart healthy/Mediterranean diet, with whole grains, fruits, vegetable, fish, lean meats, nuts, and olive oil. Limit salt. -recommend moderate walking, 3-5 times/week for 30-50 minutes each session. Aim for at least 150 minutes.week. Goal should be pace of 3 miles/hours, or walking 1.5 miles in 30 minutes -recommend avoidance of tobacco products. Avoid excess alcohol.  Plan for follow up: 6 months  Buford Dresser, MD, PhD, [facility]   Medication Adjustments/Labs and Tests Ordered: Current medicines are reviewed at length with the patient today.  Concerns regarding medicines are outlined above.  Orders Placed This Encounter  Procedures   CBC   Basic metabolic panel   Protime-INR    No orders of the defined types were placed in this encounter.    Patient  Instructions  Medication Instructions:  Your Physician recommend you continue on your current medication as directed.    *If you need a refill on your cardiac medications before your next appointment, please call your pharmacy*   Lab Work: Your provider has recommended lab work (BMP, CBC, INR). Please have this collected at [facility] [place]. The lab is open 8:00 am - 4:30 pm. Please avoid 12:00p - 1:00p for lunch hour. You do not need an appointment. Please go to [address] [place], [place] [address]. This is in the Primary Care office on the 3rd floor, let them know you are there for blood work and they will direct you to the lab.  If you have labs (blood work) drawn today and your tests are completely normal, you will receive your results only by: [place] (if you have MyChart) OR A paper copy in the mail If you have any lab test that is abnormal or we need to change your treatment, we will call you to review the results.   Testing/Procedures: None ordered today   Follow-Up: At [facility], you and your health needs are our priority.  As part of our continuing mission to provide you with exceptional heart care, we have created designated Provider Care Teams.  These Care Teams include your primary Cardiologist (physician) and Advanced Practice Providers (APPs -  Physician Assistants and Nurse Practitioners) who all work together to provide you with the care you need, when you need it.  We recommend signing up for the patient portal called "MyChart".  Sign up information is provided on this After Visit Summary.  MyChart is used to connect with patients for Virtual Visits (Telemedicine).  Patients are able to view lab/test results, encounter notes, upcoming appointments, etc.  Non-urgent messages can be sent to your provider as well.  To learn more about what you can do with MyChart, go to [email or web address].    Your next appointment:   6  month(s)  The format for your next appointment:   In Person  Provider:   Buford Dresser, MD{            I,Jenifer Velasquez,acting as a scribe for Buford Dresser, MD.,have documented all relevant documentation on the behalf of Buford Dresser, MD,as directed by  Buford Dresser, MD while in the presence of Buford Dresser, MD.  I, Buford Dresser, MD, have reviewed all documentation for this visit. The documentation on 12/20/21 for the exam, diagnosis, procedures, and orders are all accurate and complete.   Signed, Buford Dresser, MD PhD 12/20/2021  [place]

## 2021-12-21 LAB — CBC
Hematocrit: 53.6 % — ABNORMAL HIGH (ref 37.5–51.0)
Hemoglobin: 18.3 g/dL — ABNORMAL HIGH (ref 13.0–17.7)
MCH: 30.7 pg (ref 26.6–33.0)
MCHC: 34.1 g/dL (ref 31.5–35.7)
MCV: 90 fL (ref 79–97)
Platelets: 213 10*3/uL (ref 150–450)
RBC: 5.97 x10E6/uL — ABNORMAL HIGH (ref 4.14–5.80)
RDW: 12.6 % (ref 11.6–15.4)
WBC: 7.6 10*3/uL (ref 3.4–10.8)

## 2021-12-21 LAB — BASIC METABOLIC PANEL
BUN/Creatinine Ratio: 14 (ref 10–24)
BUN: 20 mg/dL (ref 8–27)
CO2: 25 mmol/L (ref 20–29)
Calcium: 9.6 mg/dL (ref 8.6–10.2)
Chloride: 90 mmol/L — ABNORMAL LOW (ref 96–106)
Creatinine, Ser: 1.4 mg/dL — ABNORMAL HIGH (ref 0.76–1.27)
Glucose: 130 mg/dL — ABNORMAL HIGH (ref 70–99)
Potassium: 5.3 mmol/L — ABNORMAL HIGH (ref 3.5–5.2)
Sodium: 129 mmol/L — ABNORMAL LOW (ref 134–144)
eGFR: 55 mL/min/{1.73_m2} — ABNORMAL LOW (ref >59)

## 2021-12-21 LAB — PROTIME-INR
INR: 2 — ABNORMAL HIGH (ref 0.9–1.2)
Prothrombin Time: 20.5 s — ABNORMAL HIGH (ref 9.1–12.0)

## 2021-12-23 ENCOUNTER — Ambulatory Visit (INDEPENDENT_AMBULATORY_CARE_PROVIDER_SITE_OTHER): Payer: Medicare HMO | Admitting: *Deleted

## 2021-12-23 ENCOUNTER — Other Ambulatory Visit: Payer: Self-pay | Admitting: Internal Medicine

## 2021-12-23 DIAGNOSIS — Z5181 Encounter for therapeutic drug level monitoring: Secondary | ICD-10-CM

## 2021-12-23 DIAGNOSIS — I513 Intracardiac thrombosis, not elsewhere classified: Secondary | ICD-10-CM

## 2021-12-23 DIAGNOSIS — I480 Paroxysmal atrial fibrillation: Secondary | ICD-10-CM | POA: Diagnosis not present

## 2021-12-23 NOTE — Patient Instructions (Addendum)
Description   Spoke with pt and instructed pt to take 1.5 tablets today then start taking 1.5 tablets daily except 1 tablet on Mondays and Fridays. Recheck INR in 2 weeks in the office at [facility]. Coumadin Clinic [phone].

## 2021-12-30 DIAGNOSIS — E1169 Type 2 diabetes mellitus with other specified complication: Secondary | ICD-10-CM | POA: Diagnosis not present

## 2022-01-09 ENCOUNTER — Ambulatory Visit (INDEPENDENT_AMBULATORY_CARE_PROVIDER_SITE_OTHER): Payer: Medicare HMO

## 2022-01-09 DIAGNOSIS — I513 Intracardiac thrombosis, not elsewhere classified: Secondary | ICD-10-CM

## 2022-01-09 DIAGNOSIS — I4891 Unspecified atrial fibrillation: Secondary | ICD-10-CM | POA: Diagnosis not present

## 2022-01-09 DIAGNOSIS — Z5181 Encounter for therapeutic drug level monitoring: Secondary | ICD-10-CM | POA: Diagnosis not present

## 2022-01-09 LAB — POCT INR: INR: 3.4 — AB (ref 2.0–3.0)

## 2022-01-09 NOTE — Patient Instructions (Signed)
HOLD TODAY AND WILL HOLD 7/30 FOR ICD 7/31. CONTINUE 1.5 tablets daily except 1 tablet on Mondays and Fridays. Recheck INR in 2 weeks in the office at [facility]. Coumadin Clinic [phone].

## 2022-01-10 ENCOUNTER — Encounter: Payer: Self-pay | Admitting: Internal Medicine

## 2022-01-10 NOTE — Pre-Procedure Instructions (Signed)
Instructed patient on the following items: Arrival time 0530 Nothing to eat or drink after midnight No meds AM of procedure Responsible person to drive you home and stay with you for 24 hrs Wash with special soap night before and morning of procedure If on anti-coagulant drug instructions Coumadin- last dose 01/11/22

## 2022-01-10 NOTE — Progress Notes (Signed)
Given scheduling issues, Dr Elliot Cousin needed to be available for device extraction on Monday 7/31--as a first case slot. I called and spoke with Mr Zayed and offered to be able to do the procedure in Dr Elliot Cousin stead; he does not know me and would prefer to have Dr Elliot Cousin do it, so we will cancel for Monday and arrange for rescheduling

## 2022-01-13 ENCOUNTER — Ambulatory Visit (HOSPITAL_COMMUNITY): Admission: RE | Admit: 2022-01-13 | Payer: Medicare HMO | Source: Home / Self Care | Admitting: Internal Medicine

## 2022-01-13 ENCOUNTER — Encounter (HOSPITAL_COMMUNITY): Admission: RE | Payer: Medicare HMO | Source: Home / Self Care

## 2022-01-13 DIAGNOSIS — I480 Paroxysmal atrial fibrillation: Secondary | ICD-10-CM

## 2022-01-13 SURGERY — ICD IMPLANT

## 2022-01-14 ENCOUNTER — Telehealth: Payer: Self-pay

## 2022-01-14 DIAGNOSIS — I428 Other cardiomyopathies: Secondary | ICD-10-CM

## 2022-01-14 NOTE — Telephone Encounter (Signed)
Left detailed message requesting call back to reschedule procedure.

## 2022-01-16 NOTE — Telephone Encounter (Signed)
Discussed with Pt via [place].  Pt has been rescheduled for ICD implant.

## 2022-01-17 ENCOUNTER — Other Ambulatory Visit: Payer: Self-pay | Admitting: Internal Medicine

## 2022-01-17 DIAGNOSIS — E1169 Type 2 diabetes mellitus with other specified complication: Secondary | ICD-10-CM

## 2022-01-22 ENCOUNTER — Ambulatory Visit (INDEPENDENT_AMBULATORY_CARE_PROVIDER_SITE_OTHER): Payer: Medicare HMO | Admitting: *Deleted

## 2022-01-22 DIAGNOSIS — I513 Intracardiac thrombosis, not elsewhere classified: Secondary | ICD-10-CM | POA: Diagnosis not present

## 2022-01-22 DIAGNOSIS — I4891 Unspecified atrial fibrillation: Secondary | ICD-10-CM | POA: Diagnosis not present

## 2022-01-22 LAB — POCT INR: INR: 4 — AB (ref 2.0–3.0)

## 2022-01-29 ENCOUNTER — Ambulatory Visit: Payer: Medicare HMO

## 2022-02-06 ENCOUNTER — Ambulatory Visit (INDEPENDENT_AMBULATORY_CARE_PROVIDER_SITE_OTHER): Payer: Medicare HMO

## 2022-02-06 DIAGNOSIS — I513 Intracardiac thrombosis, not elsewhere classified: Secondary | ICD-10-CM

## 2022-02-06 DIAGNOSIS — I4891 Unspecified atrial fibrillation: Secondary | ICD-10-CM | POA: Diagnosis not present

## 2022-02-06 DIAGNOSIS — Z5181 Encounter for therapeutic drug level monitoring: Secondary | ICD-10-CM | POA: Diagnosis not present

## 2022-02-06 LAB — POCT INR: INR: 4.5 — AB (ref 2.0–3.0)

## 2022-02-06 NOTE — Patient Instructions (Signed)
Do not take any Warfarin today and FRIDAY then continue 1.5 tablets daily except 1 tablet on Mondays, Wednesdays, and Fridays. Recheck INR in 2 weeks in the office at [facility]. Coumadin Clinic [phone].

## 2022-02-11 ENCOUNTER — Ambulatory Visit: Payer: Medicare HMO | Attending: Genetic Counselor | Admitting: Genetic Counselor

## 2022-02-17 NOTE — Progress Notes (Signed)
Post Test Genetic Consult  Referring Provider: Loralie Champagne, MD    Referral Reason  Waymon Laser was referred for a post test genetic consult of his NICM genetic testing that detected a pathogenic variant in MYH7 gene (c.2609G>A, p.Arg870His, +/-)  Personal Medical Information Brace (III.1 on pedigree) is a pleasant 66 year-old Caucasian gentleman who reports having several ablations in 2002 and was found to have CHF at age 4. In 2022 he underwent cardiac imaging studies for another ablation and was found to have moderate asymmetric septal hypertrophy with an EF of 27%. He tells me that he is scheduled for a dual ICD in November 2023. He is mostly asymptomatic but does report dyspnea and fatigue for the last 2 years.    Family history  Relation to Proband Pedigree # Current age Heart condition/age of onset Notes  Brother III.2 64 None Echo/EKG not done; colon cancer        Father II.6 Deceased None Died of COPD @ 81  Paternal aunts and uncles II.1-II.5 Deceased None Died of old age @ 69s  Paternal grandfather 1.1 Deceased None Died of old age @ 70  Paternal grandmother I.2 Deceased None Died of old age @ 67        Mother II.7 Deceased None Died of sepsis, ESRD @ 75  Maternal uncle II.3 Deceased None Died of old age @ 44  Maternal grandfather I.3 Deceased None Died of old age @ 20  Maternal grandmother I.4 Deceased None Died of stroke complications @ 34   Genetics  I reviewed the different genetic cardiomyopathies that are considered non-ischemic cardiomyopathy, namely ARVC (now called ACM), DCM, HCM and LVNC. I discussed the genetics of HCM, DCM, LVNC and ACM, namely inheritance, incomplete penetrance, variable expression, and digenic/compound mutations that can be seen in some patients.   Roshan underwent genetic testing and was found to have a pathogenic variant in MYH7 gene, namely c.2609G>A, p.Arg870His (R870H), +/-.  Myosin heavy chain gene (MYH7) encodes a sarcomeric thick  filament protein that plays an important role in thick filament force generation. I explained to the patient that the MYH7 R870H variant has been previously reported in several unrelated HCM patients from different ethnic backgrounds. This variant is associated with a benign form of the disease with longer survival rates.  I explained to the patient that this is an autosomal dominant condition with incomplete penetrance i.e. not all individuals harboring the HCM mutation will present clinically with HCM, and age-related penetrance where clinical presentation of HCM increases with advanced age.   Since HCM is an autosomal condition, first degree-relatives should seek regular surveillance for HCM.  Wallace does not have children but has a younger brother, age 44 that would benefit from genetic testing of the familial pathogenic MYH7 gene variant. If he is found to harbor the MYH7 R870H variant then he can undergo cardiac screening for HCM.  Also informed him that screening frequency is typically determined by age, with those in their teens undergoing screening every year and those over the age of 54 getting screened every 3-5 years until the age of 65. Patient verbalized understanding of this.   Impression and Plan  In summary, Tahj was found to have moderate asymmetric septal hypertrophy, although with reduced EF. Genetic testing detected a pathogenic variant in MYH7 gene [facility]) that has been previously reported in several patients with HCM. It is important for his brother to undergo genetic testing for this variant. He will discuss this with his brother.  In addition, we discussed the protections afforded by the Genetic Information Non-Discrimination Act (GINA). I explained to him that GINA protects him from losing his employment or health insurance based on his genotype. However, these protections do not cover life insurance and disability. He will confirm this with his brother and believes he has life  insurance through work.   Please note that the patient has not been counseled in this visit on personal, cultural, or ethical issues that he may face due to his heart condition.   Lattie Corns, Ph.D, [facility] Clinical Molecular Geneticist

## 2022-02-19 ENCOUNTER — Ambulatory Visit: Payer: Medicare HMO | Attending: Cardiology

## 2022-02-19 DIAGNOSIS — I513 Intracardiac thrombosis, not elsewhere classified: Secondary | ICD-10-CM | POA: Diagnosis not present

## 2022-02-19 DIAGNOSIS — I4891 Unspecified atrial fibrillation: Secondary | ICD-10-CM

## 2022-02-19 LAB — POCT INR: INR: 4.2 — AB (ref 2.0–3.0)

## 2022-02-19 NOTE — Patient Instructions (Signed)
Description   Do not take any Warfarin today and then START taking 1 tablet daily except 1.5 tablets on Tuesdays and Saturdays.  Recheck INR in 2 weeks in the office at [facility]. Coumadin Clinic [phone].

## 2022-02-20 ENCOUNTER — Other Ambulatory Visit: Payer: Self-pay | Admitting: Internal Medicine

## 2022-02-24 ENCOUNTER — Other Ambulatory Visit: Payer: Self-pay | Admitting: *Deleted

## 2022-02-25 ENCOUNTER — Other Ambulatory Visit: Payer: Self-pay

## 2022-02-25 ENCOUNTER — Inpatient Hospital Stay: Payer: Medicare HMO

## 2022-02-25 ENCOUNTER — Inpatient Hospital Stay: Payer: Medicare HMO | Attending: Hematology | Admitting: Hematology

## 2022-02-25 DIAGNOSIS — E119 Type 2 diabetes mellitus without complications: Secondary | ICD-10-CM | POA: Diagnosis not present

## 2022-02-25 DIAGNOSIS — G4730 Sleep apnea, unspecified: Secondary | ICD-10-CM | POA: Insufficient documentation

## 2022-02-25 DIAGNOSIS — I5022 Chronic systolic (congestive) heart failure: Secondary | ICD-10-CM | POA: Insufficient documentation

## 2022-02-25 DIAGNOSIS — I428 Other cardiomyopathies: Secondary | ICD-10-CM | POA: Diagnosis not present

## 2022-02-25 DIAGNOSIS — E871 Hypo-osmolality and hyponatremia: Secondary | ICD-10-CM | POA: Diagnosis not present

## 2022-02-25 DIAGNOSIS — E039 Hypothyroidism, unspecified: Secondary | ICD-10-CM | POA: Diagnosis not present

## 2022-02-25 DIAGNOSIS — Z7289 Other problems related to lifestyle: Secondary | ICD-10-CM | POA: Diagnosis not present

## 2022-02-25 DIAGNOSIS — Z79899 Other long term (current) drug therapy: Secondary | ICD-10-CM | POA: Insufficient documentation

## 2022-02-25 DIAGNOSIS — Z800 Family history of malignant neoplasm of digestive organs: Secondary | ICD-10-CM | POA: Diagnosis not present

## 2022-02-25 DIAGNOSIS — E785 Hyperlipidemia, unspecified: Secondary | ICD-10-CM | POA: Insufficient documentation

## 2022-02-25 DIAGNOSIS — I4891 Unspecified atrial fibrillation: Secondary | ICD-10-CM | POA: Diagnosis not present

## 2022-02-25 DIAGNOSIS — Z836 Family history of other diseases of the respiratory system: Secondary | ICD-10-CM | POA: Diagnosis not present

## 2022-02-25 DIAGNOSIS — Z8249 Family history of ischemic heart disease and other diseases of the circulatory system: Secondary | ICD-10-CM | POA: Insufficient documentation

## 2022-02-25 DIAGNOSIS — D751 Secondary polycythemia: Secondary | ICD-10-CM | POA: Diagnosis not present

## 2022-02-25 LAB — CMP (CANCER CENTER ONLY)
ALT: 30 U/L (ref 0–44)
AST: 33 U/L (ref 15–41)
Albumin: 4.4 g/dL (ref 3.5–5.0)
Alkaline Phosphatase: 66 U/L (ref 38–126)
Anion gap: 4 — ABNORMAL LOW (ref 5–15)
BUN: 27 mg/dL — ABNORMAL HIGH (ref 8–23)
CO2: 30 mmol/L (ref 22–32)
Calcium: 10 mg/dL (ref 8.9–10.3)
Chloride: 96 mmol/L — ABNORMAL LOW (ref 98–111)
Creatinine: 1.45 mg/dL — ABNORMAL HIGH (ref 0.61–1.24)
GFR, Estimated: 53 mL/min — ABNORMAL LOW (ref >60)
Glucose, Bld: 142 mg/dL — ABNORMAL HIGH (ref 70–99)
Potassium: 4.7 mmol/L (ref 3.5–5.1)
Sodium: 130 mmol/L — ABNORMAL LOW (ref 135–145)
Total Bilirubin: 0.7 mg/dL (ref 0.3–1.2)
Total Protein: 7.6 g/dL (ref 6.5–8.1)

## 2022-02-25 LAB — IRON AND IRON BINDING CAPACITY (CC-WL,HP ONLY)
Iron: 120 ug/dL (ref 45–182)
Saturation Ratios: 48 % — ABNORMAL HIGH (ref 17.9–39.5)
TIBC: 249 ug/dL — ABNORMAL LOW (ref 250–450)
UIBC: 129 ug/dL (ref 117–376)

## 2022-02-25 LAB — CBC WITH DIFFERENTIAL (CANCER CENTER ONLY)
Abs Immature Granulocytes: 0.02 10*3/uL (ref 0.00–0.07)
Basophils Absolute: 0.1 10*3/uL (ref 0.0–0.1)
Basophils Relative: 1 %
Eosinophils Absolute: 0.3 10*3/uL (ref 0.0–0.5)
Eosinophils Relative: 5 %
HCT: 47.7 % (ref 39.0–52.0)
Hemoglobin: 16.5 g/dL (ref 13.0–17.0)
Immature Granulocytes: 0 %
Lymphocytes Relative: 18 %
Lymphs Abs: 1.2 10*3/uL (ref 0.7–4.0)
MCH: 31.5 pg (ref 26.0–34.0)
MCHC: 34.6 g/dL (ref 30.0–36.0)
MCV: 91.2 fL (ref 80.0–100.0)
Monocytes Absolute: 0.6 10*3/uL (ref 0.1–1.0)
Monocytes Relative: 8 %
Neutro Abs: 4.6 10*3/uL (ref 1.7–7.7)
Neutrophils Relative %: 68 %
Platelet Count: 199 10*3/uL (ref 150–400)
RBC: 5.23 MIL/uL (ref 4.22–5.81)
RDW: 13.5 % (ref 11.5–15.5)
WBC Count: 6.8 10*3/uL (ref 4.0–10.5)
nRBC: 0 % (ref 0.0–0.2)

## 2022-02-25 LAB — FERRITIN: Ferritin: 384 ng/mL — ABNORMAL HIGH (ref 24–336)

## 2022-02-25 NOTE — Progress Notes (Signed)
[name]   HEMATOLOGY/ONCOLOGY CLINIC VISIT NOTE  Date of Service: 02/25/2022  Patient Care Team: [name], MD as PCP - General (Internal Medicine) Buford Dresser, MD as PCP - Cardiology (Cardiology) Thompson Grayer, MD as PCP - Electrophysiology (Cardiology)  CHIEF COMPLAINTS/PURPOSE OF CONSULTATION:  Follow-up for hereditary hemochromatosis  HISTORY OF PRESENTING ILLNESS:  Please see previous note for details of initial presentation  INTERVAL HISTORY [name] is a 66 y.o. male here for continued evaluation and management of his hereditary hemochromatosis. He reports He is doing well with no new symptoms or concerns.  We discussed increasing sodium intake in context of mild hyponatremia with sodium of 130 today.  No other new or acute focal symptoms.  Labs done today were reviewed in detail.  MEDICAL HISTORY:  Past Medical History:  Diagnosis Date   Asthma    as a teenager - does not use an inhaler although pt said he has an albuteral inhaler   Atrial fibrillation ([place])    persistant 02/2009   Atrial flutter ([facility])    s/p CTI ablation 08/06/10   Cataract    Chronic rhinitis    Colon polyp    Congestive heart failure ([place])    Diverticulosis    colonoscopy 04/03/2009   Hyperlipidemia    Hypertension    Morbid obesity ([place])    target weight = 219  for BMI < 30   Nonischemic cardiomyopathy ([place])    tachycardia mediated   Seasonal allergies    Sleep apnea    original 2001 - wears C-PaP   Type 2 diabetes mellitus ([place])     SURGICAL HISTORY: Past Surgical History:  Procedure Laterality Date   ATRIAL FIBRILLATION ABLATION N/A 12/14/2018   Procedure: ATRIAL FIBRILLATION ABLATION;  Surgeon: Thompson Grayer, MD;  Location: [place] [facility];  Service: Cardiovascular;  Laterality: N/A;   ATRIAL FIBRILLATION ABLATION N/A 10/18/2020   Procedure: ATRIAL FIBRILLATION ABLATION;  Surgeon: Thompson Grayer, MD;  Location: [place] [facility];  Service: Cardiovascular;   Laterality: N/A;   atrial flutter ablation  08/06/10   CARDIOVERSION N/A 07/05/2018   Procedure: CARDIOVERSION;  Surgeon: Buford Dresser, MD;  Location: [facility] ENDOSCOPY;  Service: Cardiovascular;  Laterality: N/A;   CARDIOVERSION N/A 08/08/2020   Procedure: CARDIOVERSION;  Surgeon: Larey Dresser, MD;  Location: [facility]OSCOPY;  Service: Cardiovascular;  Laterality: N/A;   CARDIOVERSION N/A 08/29/2020   Procedure: CARDIOVERSION;  Surgeon: Jolaine Artist, MD;  Location: [place];  Service: Cardiovascular;  Laterality: N/A;   COLONOSCOPY     POLYPECTOMY     RIGHT HEART CATH N/A 08/06/2020   Procedure: RIGHT HEART CATH;  Surgeon: Larey Dresser, MD;  Location: [place] [facility];  Service: Cardiovascular;  Laterality: N/A;   RIGHT/LEFT HEART CATH AND CORONARY ANGIOGRAPHY N/A 11/01/2021   Procedure: RIGHT/LEFT HEART CATH AND CORONARY ANGIOGRAPHY;  Surgeon: Larey Dresser, MD;  Location: [place] [facility];  Service: Cardiovascular;  Laterality: N/A;   TEE WITHOUT CARDIOVERSION N/A 12/14/2018   Procedure: TRANSESOPHAGEAL ECHOCARDIOGRAM (TEE);  Surgeon: Thompson Grayer, MD;  Location: [place] [facility];  Service: Cardiovascular;  Laterality: N/A;   TEE WITHOUT CARDIOVERSION N/A 08/03/2020   Procedure: TRANSESOPHAGEAL ECHOCARDIOGRAM (TEE);  Surgeon: Sanda Klein, MD;  Location: [place];  Service: Cardiovascular;  Laterality: N/A;   TEE WITHOUT CARDIOVERSION N/A 08/08/2020   Procedure: TRANSESOPHAGEAL ECHOCARDIOGRAM (TEE);  Surgeon: Larey Dresser, MD;  Location: [facility] END[facility]COPY;  Service: Cardiovascular;  Laterality: N/A;   TEE WITHOUT CARDIOVERSION  N/A 10/18/2020   Procedure: TRANSESOPHAGEAL ECHOCARDIOGRAM (TEE);  Surgeon: Jerline Pain, MD;  Location: [facility] ENDOSCOPY;  Service: Cardiovascular;  Laterality: N/A;    SOCIAL HISTORY: Social History   Socioeconomic History   Marital status: Single    Spouse name: Not on file   Number of children: 0   Years of education: Not on file    Highest education level: Not on file  Occupational History   Occupation: Sales  Tobacco Use   Smoking status: Never   Smokeless tobacco: Never  Vaping Use   Vaping Use: Never used  Substance and Sexual Activity   Alcohol use: Yes    Alcohol/week: 21.0 standard drinks of alcohol    Types: 21 Cans of beer per week    Comment: previously quite heavy, working on cessation    Drug use: No   Sexual activity: Not on file  Other Topics Concern   Not on file  Social History Narrative   Lives in [place] and works for [name]   Social Determinants of Health   Financial Resource Strain: Low Risk  (06/20/2021)   Overall Financial Resource Strain (CARDIA)    Difficulty of Paying Living Expenses: Not hard at all  Food Insecurity: No Food Insecurity (06/20/2021)   Hunger Vital Sign    Worried About Running Out of Food in the Last Year: Never true    Ran Out of Food in the Last Year: Never true  Transportation Needs: No Transportation Needs (06/20/2021)   PRAPARE - Hydrologist (Medical): No    Lack of Transportation (Non-Medical): No  Physical Activity: Sufficiently Active (06/20/2021)   Exercise Vital Sign    Days of Exercise per Week: 5 days    Minutes of Exercise per Session: 30 min  Stress: No Stress Concern Present (06/20/2021)   [place]    Feeling of Stress : Not at all  Social Connections: Unknown (06/20/2021)   Social Connection and Isolation Panel [NHANES]    Frequency of Communication with Friends and Family: More than three times a week    Frequency of Social Gatherings with Friends and Family: Twice a week    Attends Religious Services: Patient refused    Active Member of Clubs or Organizations: Patient refused    Attends Archivist Meetings: Patient refused    Marital Status: Never married  Intimate Partner Violence: Not At Risk (06/20/2021)   Humiliation, Afraid, Rape,  and Kick questionnaire    Fear of Current or Ex-Partner: No    Emotionally Abused: No    Physically Abused: No    Sexually Abused: No    FAMILY HISTORY: Family History  Problem Relation Age of Onset   Hypertension Mother    Kidney failure Mother        Due to sepsis   COPD Father    Colon cancer Brother        dx in 76's   Esophageal cancer Neg Hx    Rectal cancer Neg Hx    Stomach cancer Neg Hx     ALLERGIES:  has No Known Allergies.  MEDICATIONS:  Current Outpatient Medications  Medication Sig Dispense Refill   budesonide-formoterol (SYMBICORT) 80-4.5 MCG/ACT inhaler Inhale 2 puffs into the lungs daily as needed (Shortness of breath).     carvedilol (COREG) 12.5 MG tablet Take 1.5 tablets (18.75 mg total) by mouth 2 (two) times daily with a meal. 270 tablet 3  cholecalciferol (VITAMIN D3) 25 MCG (1000 UNIT) tablet Take 1,000 Units by mouth daily.     Continuous Blood Gluc Receiver (FREESTYLE LIBRE 14 DAY READER) DEVI USE AS DIRECTED 1 each 11   doxylamine, Sleep, (UNISOM) 25 MG tablet Take 50 mg by mouth at bedtime.     empagliflozin (JARDIANCE) 10 MG TABS tablet Take 1 tablet (10 mg total) by mouth daily. 30 tablet 5   furosemide (LASIX) 20 MG tablet Take 0.5 tablets (10 mg total) by mouth 2 (two) times daily. 30 tablet 2   insulin aspart (NOVOLOG FLEXPEN) 100 UNIT/ML FlexPen INJECT up to 8 UNITS INTO THE SKIN 1-3 TIMES DAILY BEFORE MEALS. (Patient taking differently: Inject 8-12 Units into the skin 3 (three) times daily with meals.) 15 mL 3   insulin degludec (TRESIBA FLEXTOUCH) 200 UNIT/ML FlexTouch Pen Inject 28 Units into the skin daily.     Insulin Pen Needle (BD PEN NEEDLE NANO 2ND GEN) 32G X 4 MM MISC USE 4 TIMES A DAY AS DIRECTED 100 each 4   levothyroxine (SYNTHROID) 50 MCG tablet TAKE 1 TABLET BY MOUTH EVERY DAY BEFORE BREAKFAST 90 tablet 2   mometasone (NASONEX) 50 MCG/ACT nasal spray Place 2 sprays into the nose daily as needed (allergies or rhinitis).      Multiple Vitamins-Minerals (MULTIVITAMIN WITH MINERALS) tablet Take 2 tablets by mouth at bedtime. Chewable     rosuvastatin (CRESTOR) 10 MG tablet Take 1 tablet (10 mg total) by mouth daily. 90 tablet 3   sacubitril-valsartan (ENTRESTO) 97-103 MG Take 1 tablet by mouth 2 (two) times daily. 180 tablet 3   Semaglutide,0.25 or 0.'5MG'$ /DOS, (OZEMPIC, 0.25 OR 0.5 MG/DOSE,) 2 MG/3ML SOPN Inject 0.5 mg into the skin once a week. 9 mL 1   spironolactone (ALDACTONE) 25 MG tablet Take 1 tablet (25 mg total) by mouth daily. Please call [phone] to schedule an appointment with Dr. Cristopher Peru for future refills. Thank you. 1st attempt. 30 tablet 0   vitamin C (ASCORBIC ACID) 500 MG tablet Take 500 mg by mouth in the morning.     warfarin (COUMADIN) 5 MG tablet TAKE 1 TABLET TO 1 AND 1/2 TABLETS BY MOUTH DAILY AS DIRECTED BY THE COUMADIN CLINIC. (Patient taking differently: Take 5-7.5 mg by mouth See admin instructions. Take 5 mg daily on Mon and Fri Take 7.5 mg daily on Sun, Tues, Wed, Thurs, and Sat) 135 tablet 1   No current facility-administered medications for this visit.    REVIEW OF SYSTEMS:   10 Point review of Systems was done is negative except as noted above.  PHYSICAL EXAMINATION: .BP 117/72 (BP Location: Left Arm, Patient Position: Sitting)   Pulse 78   Temp 97.7 F (36.5 C) (Temporal)   Resp 15   Ht '6\' 1"'$  (1.854 m)   Wt 267 lb 8 oz (121.3 kg)   SpO2 98%   BMI 35.29 kg/m  NAD GENERAL:alert, in no acute distress and comfortable SKIN: no acute rashes, no significant lesions EYES: conjunctiva are pink and non-injected, sclera anicteric NECK: supple, no JVD LYMPH:  no palpable lymphadenopathy in the cervical, axillary or inguinal regions LUNGS: clear to auscultation b/l with normal respiratory effort HEART: regular rate & rhythm ABDOMEN:  normoactive bowel sounds , non tender, not distended. Extremity: no pedal edema PSYCH: alert & oriented x 3 with fluent speech NEURO: no focal  motor/sensory deficits  LABORATORY DATA:    Latest Ref Rng & Units 02/25/2022    2:20 PM 12/20/2021   11:14  AM 10/07/2021    2:21 PM  CBC  WBC 4.0 - 10.5 K/uL 6.8  7.6  7.4   Hemoglobin 13.0 - 17.0 g/dL 16.5  18.3  18.9   Hematocrit 39.0 - 52.0 % 47.7  53.6  55.8   Platelets 150 - 400 K/uL 199  213  198     .    Latest Ref Rng & Units 02/25/2022    2:20 PM 12/20/2021   11:14 AM 11/18/2021    1:47 PM  CMP  Glucose 70 - 99 mg/dL 142  130  99   BUN 8 - 23 mg/dL '27  20  17   '$ Creatinine 0.61 - 1.24 mg/dL 1.45  1.40  1.45   Sodium 135 - 145 mmol/L 130  129  135   Potassium 3.5 - 5.1 mmol/L 4.7  5.3  4.2   Chloride 98 - 111 mmol/L 96  90  100   CO2 22 - 32 mmol/L '30  25  27   '$ Calcium 8.9 - 10.3 mg/dL 10.0  9.6  9.1   Total Protein 6.5 - 8.1 g/dL 7.6     Total Bilirubin 0.3 - 1.2 mg/dL 0.7     Alkaline Phos 38 - 126 U/L 66     AST 15 - 41 U/L 33     ALT 0 - 44 U/L 30      . Lab Results  Component Value Date   IRON 126 08/21/2021   TIBC 220 (L) 08/21/2021   IRONPCTSAT 57 (H) 08/21/2021   (Iron and TIBC)  Lab Results  Component Value Date   FERRITIN 200 08/21/2021      RADIOGRAPHIC STUDIES: I have personally reviewed the radiological images as listed and agreed with the findings in the report. No results found.  ASSESSMENT & PLAN:   66 year old with multiple medical comorbidities including hypertension, dyslipidemia, diabetes, nonischemic cardiomyopathy, sleep apnea using CPAP, persistent atrial fibrillation on anticoagulation with  #1 compound heterozygous state for hereditary hemochromatosis. c.845G>A (p.Cys282Tyr) - Detected, heterozygous  c.187C>G (p.His63Asp) - Detected, heterozygous  c.193A>T (p.Ser65Cys) - Not Detected  His previous elevated ferritin levels appear to not reflect a primary iron overload state but were likely due to his transaminitis from heavy alcohol use.  His ferritin levels have come down from 1100 to 89 over 2 years without any interventions  bleeding blood donations or phlebotomies in tandem with improvement in his liver function tests.  Plan Labs done today were reviewed with him CBC shows resolved secondary polycythemia with a hemoglobin of 16.5 and hematocrit of 47.7 with normal WBC count and platelets Ferritin 20057 with an iron saturation of 57% CMP with stable chronic kidney disease creatinine of 1.45 otherwise unremarkable. No indication for therapeutic phlebotomy at this time We will try to minimize need for therapeutic phlebotomy and keep his goals more conservative with ferritin goal of less than 250 and iron saturation of less than 75%. Given his nonischemic cardiomyopathy and other medical comorbidities and risk of bleeding with Coumadin we are trying to avoid significant volume shifts. We discussed increasing sodium intake in context of mild hyponatremia with sodium of 130 today.  #2  Secondary polycythemia -This has developed since February 2022. Jak2 V617F mutation and Jak2 Exon 12 mutation testing is negative which makes polycythemia vera less than 1% likely. His current hematocrit is 60 with a hemoglobin level of 20.3 The timeline of his presentation is in keeping with the initiation of Jardiance which can cause osmotic diuresis and secondary  polycythemia from hemoconcentration. Patient is also on diuretic therapy, has congestive heart failure due to nonischemic cardiomyopathy, has sleep apnea and potentially COPD. Plan No indication for therapeutic phlebotomy for his secondary polycythemia at this time Follow-up with primary care physician for management of secondary factors causing polycythemia including the use of Jardiance, diuretics. -Follow-up with PCP for management of sleep apnea and consideration of lung function testing for COPD.  Follow-up Return to clinic with Dr. Irene Limbo with labs in 6 months  The total time spent in the appointment was 20\ minutes*.  All of the patient's questions were answered  with apparent satisfaction. The patient knows to call the clinic with any problems, questions or concerns.   Sullivan Lone MD MS AAHIVMS [facility] Hematology/Oncology Physician [facility]  .*Total Encounter Time as defined by the Centers for Medicare and Medicaid Services includes, in addition to the face-to-face time of a patient visit (documented in the note above) non-face-to-face time: obtaining and reviewing outside history, ordering and reviewing medications, tests or procedures, care coordination (communications with other health care professionals or caregivers) and documentation in the medical record.  I, Melene Muller, am acting as scribe for Dr. Sullivan Lone, MD.  .I have reviewed the above documentation for accuracy and completeness, and I agree with the above.Brunetta Genera MD

## 2022-02-26 ENCOUNTER — Encounter: Payer: Self-pay | Admitting: *Deleted

## 2022-02-26 DIAGNOSIS — Z006 Encounter for examination for normal comparison and control in clinical research program: Secondary | ICD-10-CM

## 2022-02-26 NOTE — Research (Signed)
Spoke with [name] about Designer, multimedia. States he would like more information. Emailed a copy of the consent to read over. Encouraged him to call with any questions.

## 2022-03-04 ENCOUNTER — Encounter (HOSPITAL_COMMUNITY): Payer: Self-pay | Admitting: Cardiology

## 2022-03-04 ENCOUNTER — Ambulatory Visit
Admission: RE | Admit: 2022-03-04 | Discharge: 2022-03-04 | Disposition: A | Discharge status: Home / Self Care | Payer: Medicare HMO | Source: Ambulatory Visit | Attending: Cardiology | Admitting: Cardiology

## 2022-03-04 VITALS — BP 92/60 | HR 67 | Wt 266.6 lb

## 2022-03-04 DIAGNOSIS — D751 Secondary polycythemia: Secondary | ICD-10-CM | POA: Insufficient documentation

## 2022-03-04 DIAGNOSIS — E1136 Type 2 diabetes mellitus with diabetic cataract: Secondary | ICD-10-CM | POA: Insufficient documentation

## 2022-03-04 DIAGNOSIS — Z79899 Other long term (current) drug therapy: Secondary | ICD-10-CM | POA: Diagnosis not present

## 2022-03-04 DIAGNOSIS — I422 Other hypertrophic cardiomyopathy: Secondary | ICD-10-CM | POA: Diagnosis not present

## 2022-03-04 DIAGNOSIS — Z7984 Long term (current) use of oral hypoglycemic drugs: Secondary | ICD-10-CM | POA: Diagnosis not present

## 2022-03-04 DIAGNOSIS — I130 Hypertensive heart and chronic kidney disease with heart failure and stage 1 through stage 4 chronic kidney disease, or unspecified chronic kidney disease: Secondary | ICD-10-CM | POA: Insufficient documentation

## 2022-03-04 DIAGNOSIS — N183 Chronic kidney disease, stage 3 unspecified: Secondary | ICD-10-CM | POA: Diagnosis not present

## 2022-03-04 DIAGNOSIS — I2510 Atherosclerotic heart disease of native coronary artery without angina pectoris: Secondary | ICD-10-CM | POA: Diagnosis not present

## 2022-03-04 DIAGNOSIS — E1122 Type 2 diabetes mellitus with diabetic chronic kidney disease: Secondary | ICD-10-CM | POA: Insufficient documentation

## 2022-03-04 DIAGNOSIS — G4733 Obstructive sleep apnea (adult) (pediatric): Secondary | ICD-10-CM | POA: Diagnosis not present

## 2022-03-04 DIAGNOSIS — I4892 Unspecified atrial flutter: Secondary | ICD-10-CM | POA: Diagnosis not present

## 2022-03-04 DIAGNOSIS — I5022 Chronic systolic (congestive) heart failure: Secondary | ICD-10-CM | POA: Insufficient documentation

## 2022-03-04 DIAGNOSIS — I480 Paroxysmal atrial fibrillation: Secondary | ICD-10-CM | POA: Diagnosis not present

## 2022-03-04 DIAGNOSIS — Z7901 Long term (current) use of anticoagulants: Secondary | ICD-10-CM | POA: Diagnosis not present

## 2022-03-04 NOTE — Patient Instructions (Signed)
There has been no changes to your medications.  Your physician has requested that you have an echocardiogram. Echocardiography is a painless test that uses sound waves to create images of your heart. It provides your doctor with information about the size and shape of your heart and how well your heart's chambers and valves are working. This procedure takes approximately one hour. There are no restrictions for this procedure.  Your physician recommends that you schedule a follow-up appointment in: 3 months with an echocardiogram.  If you have any questions or concerns before your next appointment please send [place] a message through [place] or call our office at [phone].    TO LEAVE A MESSAGE FOR THE NURSE SELECT OPTION 2, PLEASE LEAVE A MESSAGE INCLUDING: YOUR NAME DATE OF BIRTH CALL BACK NUMBER REASON FOR CALL**this is important as we prioritize the call backs  YOU WILL RECEIVE A CALL BACK THE SAME DAY AS LONG AS YOU CALL BEFORE 4:00 PM  At the [facility], you and your health needs are our priority. As part of our continuing mission to provide you with exceptional heart care, we have created designated Provider Care Teams. These Care Teams include your primary Cardiologist (physician) and Advanced Practice Providers (APPs- Physician Assistants and Nurse Practitioners) who all work together to provide you with the care you need, when you need it.   You may see any of the following providers on your designated Care Team at your next follow up: Dr Glori Bickers Dr Loralie Champagne Dr. Roxana Hires, NP Lyda Jester, [place] [facility] [place], [place] Forestine Na, NP Audry Riles, PharmD   Please be sure to bring in all your medications bottles to every appointment.

## 2022-03-04 NOTE — Progress Notes (Signed)
PCP:  Dr Sharlet Salina  Primary Cardiologist: Dr Harrell Gave HF MD: Dr Aundra Dubin  EP: Dr Rayann Heman   HPI: 66 y.o. male w/ h/o cardiomyopathy, presumed to be tachy-mediated from atrial fibrillation/atrial flutter.  Paroxsymal Afib/flutter dates back to ~2012. Had Aflutter ablation in 2012. He had several echos between 2012-2018 showing normal LVEF. Found to have reduced EF on echo in 04/2018, down to 30-35%. Was in afib at the time. 11/2018 was referred for Afib ablation but procedure was aborted after TEE showed LA thrombus, despite being on Xarelto. Anticoagulation changed to Pradaxa. He ultimately spontaneously converted back to NSR. Repeat echo 10/20 showed EF 35-40%.  Plans were made for [facility] 10/20 however had asymptomatic positive COVID test, then renal failure. Ischemic eval changed to [employer] which showed no ischemia in 10/20. Echo repeated 3/21, EF 40-45%, RV normal.    Seen in cardiology clinic, back in atrial fibrillation with RVR.  Presented for outpatient DCCV 08/03/20. TEE showed LA thrombus. DCCV canceled. EF lower on TEE ~5-10% with RV severely reduced. Admitted and started on IV Lasix and PICC line was placed. Milrinone started for low output, initial co-ox 35%. Pradaxa stopped and he was placed on coumadin w/ heparin bridge. Placed on IV amio for afib. He had repeat TEE on 08/07/20 that showed no LAA thrombus so DCCV was done successfully. Transitioned to PO amio 200 mg bid. Remained in NSR. Diuresed well w/ IV Lasix and able to wean of milrinone. Discharge weight 264 pounds.   He went back into atrial fibrillation in 3/22, had another DCCV back to NSR.  He had a TEE in 5/22 showing EF 30-35%, then had atrial fibrillation ablation.    Echo in 7/22 showed showed EF 40-45% with normal RV and moderate MR.   Patient has polycythemia and was noted to be a compound heterozygote for hereditary hemochromatosis.  Cardiac MRI was done in 11/22 showing EF 27%, moderate asymmetric septal  hypertrophy, diffuse hypokinesis worse in the septal wall, RV EF 30%, noncoronary LGE with diffuse mid-wall involvement especially in the septum, T2* sequences not suggestive of cardiac hemochromatosis, ECV 50% in the septum.  Echo in 11/22 showed EF 30-35%, severe asymmetric septal hypertrophy, no LVOT gradient or mitral valve SAM, mild RV dilation with mildly decreased RV systolic function.    Patient saw hematology, will not get therapeutic phlebotomy as long as ferritin < 200 and transferrin saturation < 75%.   PYP scan in 11/22 was not suggestive of TTR cardiac amyloidosis.  Genetic testing showed that patient was a heterozygote for MYH7 gene mutation that has been linked to hypertrophic cardiomyopathy. He is followed by Dr. Broadus John.   [facility] 5/23 showed mild nonobstructive CAD w/ 25% mLCx and 25% pRCA lesions, normal filling pressures and low but not markedly low cardiac output, CI 2.16.   He presents today for routine f/u. Reports doing well. No major limitations w/ ADLs. Able to play golf w/o limitation. Denies CP. No palpitations. No dizziness/ syncope/ near syncope. EKG shows NSR w/ 1 PVC. Compliant w/ coumadin. INRs followed by coumadin clinic. Notes some occasional hemorrhoidal bleeding but no other bleeding. Had labs done last week, hgb stable, SCr and K  also stable. He is planning ICD in Nov w/ Dr. Lovena Le.     [facility] 5/23   Mid Cx lesion is 25% stenosed.   Prox RCA lesion is 25% stenosed.   1. Mild nonobstructive CAD.  2. Normal filling pressures.  3. Low but not markedly low cardiac output.  Right Heart Pressures RHC Procedural Findings: Hemodynamics (mmHg) RA mean 1 RV 18/7 PA 20/12, mean 13 PCWP mean 8 LV 110/8 AO 111/67  Oxygen saturations: PA 71% AO 95%  Cardiac Output (Fick) 5.19  Cardiac Index (Fick) 2.16   He returns today for followup of CHF.  He is in NSR.  Rarely drinking ETOH (prior heavy).  No significant exertional dyspnea.  He plays golf without  problems.  No chest pain.  No lightheadedness.  No palpitations.   Labs (5/22): K 4.1, creatinine 1.49 Labs (9/22): hgb 20.3/HCT 60.1, K 4.9, creatinine 1.46, ferritin 116 Labs (11/22): Negative myeloma panel.  K 4.6, creatinine 1.27, LFTs normal, TSH normal.  Labs (3/23): K 3.5, creatinine 1.4, hgb 18 Labs (6/23): K 4.2, creatinine 1.45, Hgb 18.0, Direct LDL 183 mg/dL Labs (9/23): K 4.7, creatinine 1.45, Hgb 16.5   ECG (personally reviewed): NSR, IVCD 134 msec  PMH: 1. Atrial flutter: Ablation 2012.  2. Atrial fibrillation: H/o LAA thrombus on both Xarelto and Pradaxa.  DCCV 2/22, 3/22. Atrial fibrillation ablation in 5/22.  3. Chronic systolic CHF: ?tachycardia-mediated CMP with AF/RVR.  - Cardiolite (10/20): No ischemia.  - TEE (2/22): EF 5-10% with severe RV dysfunction.  - Echo (7/22): EF 40-45%, normal RV, moderate MR.  - Cardiac MRI (11/22): EF 27%, moderate asymmetric septal hypertrophy, diffuse hypokinesis worse in the septal wall, RV EF 30%, noncoronary LGE with diffuse mid-wall involvement especially in the septum, T2* sequences not suggestive of cardiac hemochromatosis, ECV 50% in the septum.  - Echo (11/22): EF 30-35%, severe asymmetric septal hypertrophy, no LVOT gradient or mitral valve SAM, mild RV dilation with mildly decreased RV systolic function.   - PYP scan (11/22): grade 1, H/CL 0.95 (negative) - RHC/LCH (5/23): Mild nonobstructive CAD; mean RA 1, PA 20/12, PCWP 8, CI 2.16 - Genetic testing showed that patient was a heterozygote for MYH7 gene mutation that has been linked to hypertrophic cardiomyopathy.  4. CKD stage 3 5. OSA: Uses CPAP 6. Type 2 diabetes 7. Hereditary hemochromatosis: Compound heterozygote with Cys282Tyr and His63Asp.   ROS: All systems negative except as listed in HPI, PMH and Problem List.  Social History   Socioeconomic History   Marital status: Single    Spouse name: Not on file   Number of children: 0   Years of education: Not on file    Highest education level: Not on file  Occupational History   Occupation: Sales  Tobacco Use   Smoking status: Never   Smokeless tobacco: Never  Vaping Use   Vaping Use: Never used  Substance and Sexual Activity   Alcohol use: Yes    Alcohol/week: 21.0 standard drinks of alcohol    Types: 21 Cans of beer per week    Comment: previously quite heavy, working on cessation    Drug use: No   Sexual activity: Not on file  Other Topics Concern   Not on file  Social History Narrative   Lives in [place] and works for [name]   Social Determinants of Health   Financial Resource Strain: Low Risk  (06/20/2021)   Overall Financial Resource Strain (CARDIA)    Difficulty of Paying Living Expenses: Not hard at all  Food Insecurity: No Food Insecurity (06/20/2021)   Hunger Vital Sign    Worried About Running Out of Food in the Last Year: Never true    Ran Out of Food in the Last Year: Never true  Transportation Needs: No Transportation Needs (06/20/2021)   PRAPARE -  Hydrologist (Medical): No    Lack of Transportation (Non-Medical): No  Physical Activity: Sufficiently Active (06/20/2021)   Exercise Vital Sign    Days of Exercise per Week: 5 days    Minutes of Exercise per Session: 30 min  Stress: No Stress Concern Present (06/20/2021)   [place]    Feeling of Stress : Not at all  Social Connections: Unknown (06/20/2021)   Social Connection and Isolation Panel [NHANES]    Frequency of Communication with Friends and Family: More than three times a week    Frequency of Social Gatherings with Friends and Family: Twice a week    Attends Religious Services: Patient refused    Active Member of Clubs or Organizations: Patient refused    Attends Archivist Meetings: Patient refused    Marital Status: Never married  Intimate Partner Violence: Not At Risk (06/20/2021)   Humiliation, Afraid, Rape,  and Kick questionnaire    Fear of Current or Ex-Partner: No    Emotionally Abused: No    Physically Abused: No    Sexually Abused: No    FH:  Family History  Problem Relation Age of Onset   Hypertension Mother    Kidney failure Mother        Due to sepsis   COPD Father    Colon cancer Brother        dx in 44's   Esophageal cancer Neg Hx    Rectal cancer Neg Hx    Stomach cancer Neg Hx     Past Medical History:  Diagnosis Date   Asthma    as a teenager - does not use an inhaler although pt said he has an albuteral inhaler   Atrial fibrillation ([place])    persistant 02/2009   Atrial flutter ([place])    s/p CTI ablation 08/06/10   Cataract    Chronic rhinitis    Colon polyp    Congestive heart failure ([place])    Diverticulosis    colonoscopy 04/03/2009   Hyperlipidemia    Hypertension    Morbid obesity ([place])    target weight = 219  for BMI < 30   Nonischemic cardiomyopathy ([place])    tachycardia mediated   Seasonal allergies    Sleep apnea    original 2001 - wears C-PaP   Type 2 diabetes mellitus (H[facility])     Current Outpatient Medications  Medication Sig Dispense Refill   budesonide-formoterol (SYMBICORT) 80-4.5 MCG/ACT inhaler Inhale 2 puffs into the lungs daily as needed (Shortness of breath).     carvedilol (COREG) 12.5 MG tablet Take 1.5 tablets (18.75 mg total) by mouth 2 (two) times daily with a meal. 270 tablet 3   cholecalciferol (VITAMIN D3) 25 MCG (1000 UNIT) tablet Take 1,000 Units by mouth daily.     Continuous Blood Gluc Receiver (FREESTYLE LIBRE 14 DAY READER) DEVI USE AS DIRECTED 1 each 11   doxylamine, Sleep, (UNISOM) 25 MG tablet Take 50 mg by mouth at bedtime.     empagliflozin (JARDIANCE) 10 MG TABS tablet Take 1 tablet (10 mg total) by mouth daily. 30 tablet 5   furosemide (LASIX) 20 MG tablet Take 0.5 tablets (10 mg total) by mouth 2 (two) times daily. 30 tablet 2   insulin aspart (NOVOLOG FLEXPEN) 100 UNIT/ML FlexPen INJECT up to 8 UNITS INTO THE SKIN 1-3  TIMES DAILY BEFORE MEALS. 15 mL 3   insulin degludec (TRESIBA FLEXTOUCH)  200 UNIT/ML FlexTouch Pen Inject 28 Units into the skin daily.     Insulin Pen Needle (BD PEN NEEDLE NANO 2ND GEN) 32G X 4 MM MISC USE 4 TIMES A DAY AS DIRECTED 100 each 4   levothyroxine (SYNTHROID) 50 MCG tablet TAKE 1 TABLET BY MOUTH EVERY DAY BEFORE BREAKFAST 90 tablet 2   mometasone (NASONEX) 50 MCG/ACT nasal spray Place 2 sprays into the nose daily as needed (allergies or rhinitis).     Multiple Vitamins-Minerals (MULTIVITAMIN WITH MINERALS) tablet Take 2 tablets by mouth at bedtime. Chewable     rosuvastatin (CRESTOR) 10 MG tablet Take 1 tablet (10 mg total) by mouth daily. 90 tablet 3   sacubitril-valsartan (ENTRESTO) 97-103 MG Take 1 tablet by mouth 2 (two) times daily. 180 tablet 3   Semaglutide,0.25 or 0.'5MG'$ /DOS, (OZEMPIC, 0.25 OR 0.5 MG/DOSE,) 2 MG/3ML SOPN Inject 0.5 mg into the skin once a week. 9 mL 1   spironolactone (ALDACTONE) 25 MG tablet Take 1 tablet (25 mg total) by mouth daily. Please call [phone] to schedule an appointment with Dr. Cristopher Peru for future refills. Thank you. 1st attempt. 30 tablet 0   vitamin C (ASCORBIC ACID) 500 MG tablet Take 500 mg by mouth in the morning.     warfarin (COUMADIN) 5 MG tablet TAKE 1 TABLET TO 1 AND 1/2 TABLETS BY MOUTH DAILY AS DIRECTED BY THE COUMADIN CLINIC. (Patient taking differently: Take 5-7.5 mg by mouth See admin instructions. Take 5 mg daily on Mon and Fri Take 7.5 mg daily on Sun, Tues, Wed, Thurs, and Sat) 135 tablet 1   No current facility-administered medications for this encounter.    Vitals:   03/04/22 1401  BP: 92/60  Pulse: 67  SpO2: 98%  Weight: 120.9 kg (266 lb 9.6 oz)   Wt Readings from Last 3 Encounters:  03/04/22 120.9 kg (266 lb 9.6 oz)  02/25/22 121.3 kg (267 lb 8 oz)  12/20/21 120.1 kg (264 lb 12.8 oz)   PHYSICAL EXAM: General:  Well appearing, moderately obese. No respiratory difficulty HEENT: normal Neck: supple. no JVD.  Carotids 2+ bilat; no bruits. No lymphadenopathy or thyromegaly appreciated. Cor: PMI nondisplaced. Regular rate & rhythm. No rubs, gallops or murmurs. Lungs: clear Abdomen: soft, nontender, nondistended. No hepatosplenomegaly. No bruits or masses. Good bowel sounds. Extremities: no cyanosis, clubbing, rash, edema Neuro: alert & oriented x 3, cranial nerves grossly intact. moves all 4 extremities w/o difficulty. Affect pleasant.    ASSESSMENT & PLAN: 1. Chronic systolic CHF: TEE 6/96 with EF <10%, severe RV dysfunction, LA appendage thrombus. Echoes in the past have had EF ranging 30-45%. Cardiolite in 10/20 showed no ischemia. Initially, thought to have tachycardia-mediated cardiomyopathy as he seems to become markedly more symptomatic when in atrial fibrillation.  Alternatively, he could have another cause for cardiomyopathy and atrial fibrillation just causes him to decompensate.  Echo in 7/22 in NSR showed EF 40-45% with normal RV, moderate MR (in NSR after atrial fibrillation ablation). He was found to be a compound heterozygote for hereditary hemochromatosis (evaluation done because of polycythemia), so I was concerned that he could have a hemochromatosis-related CMP.  However, cardiac MRI was done in 11/22, showing EF 27%, moderate asymmetric septal hypertrophy, diffuse hypokinesis worse in the septal wall, RV EF 30%, noncoronary LGE with diffuse mid-wall involvement especially in the septum, T2* sequences not suggestive of cardiac hemochromatosis, ECV 50% in the septum.  Cardiomyopathy appeared most consistent with hypertrophic cardiomyopathy (nonobstructive), less likely amyloidosis or sarcoidosis.  Myeloma panel negative and PYP scan not suggestive of TTR cardiac amyloidosis.  Genetic testing showed that patient was a heterozygote for MYH7 gene mutation that has been linked to hypertrophic cardiomyopathy => I suspect that HCM is the diagnosis here.  [facility] 5/23 showed mild nonobstructive CAD,  normal filling pressures and low but not markedly low cardiac output, CI 2.16.  - Stable NYHA class I-II symptoms. Euvolemic on exam  - Continue Lasix 10 mg bid     - Continue Coreg 18.75 mg bid. BP too soft for titration  - Continue Entresto 97/103 bid.  - Continue Jardiance 10 mg daily.  - Continue spironolactone 25 mg daily.   - Labs 9/12 reviewed. SCr and K both stable  - I think he should get ICD with extensive scarring on MRI and persistent low EF. Narrow QRS, not candidate for CRT-D  Seen by Dr. Lovena Le. Planning ICD implant 11/23.  - Discussed + MYH7 gene test. He has no children but has 1 living brother. Dr. Broadus John has recommended he undergo genetic testing as well.  - plan repeat echo in 3 months 2. Atrial fibrillation: Paroxysmal. With LAA clots despite Xarelto and Pradaxa use, he was started on warfarin.  Will aim for INR 2.5-3.  AF ablation in 5/22. He is off amiodarone post-ablation. EKG today shows NSR. Denies symptoms of breakthrough Afib. - Continue warfarin.  - INRs/ CBC followed in coumadin clinic  3. CKD stage 3:  - b/l SCr ~1.4, stable on recent labs  4. OSA: Reports full compliance w/ CPAP. Continue CPAP nightly.  5. Type 2 diabetes: followed by PCP  - Continue Jardiance 10 mg daily.  6. Hereditary hemochromatosis: Compound heterozygote.  T2* sequences on cardiac MRI were not suggestive of cardiac hemochromatosis.  - Therapeutic phlebotomy only if ferritin > 200 or transferrin saturation > 75%.  7. CAD: mild nonobstructive CAD on cath 5/23, 25% mLCx and 25% pRCA lesions - on Crestor 10 mg. Lipids not at goal, direct LDL in June 139 mg/dL. Will increase Crestor to 20 mg daily. Repeat LP at next visit  - no ASA w/ warfarin  - continue ? blocker  Followup w/ Dr. Aundra Dubin in 3 months w/ repeat echo   Lyda Jester, PA-C  03/04/2022  Patient seen with PA, agree with the above note.    He has been doing well recently, no significant exertional dyspnea.  Weight stable.    General: NAD Neck: No JVD, no thyromegaly or thyroid nodule.  Lungs: Clear to auscultation bilaterally with normal respiratory effort. CV: Nondisplaced PMI.  Heart regular S1/S2, no S3/S4, no murmur.  No peripheral edema.  No carotid bruit.  Normal pedal pulses.  Abdomen: Soft, nontender, no hepatosplenomegaly, no distention.  Skin: Intact without lesions or rashes.  Neurologic: Alert and oriented x 3.  Psych: Normal affect. Extremities: No clubbing or cyanosis.  HEENT: Normal.   Suspect cardiomyopathy due to hypertrophic cardiomyopathy.  NYHA class II symptoms.   - He will be getting ICD soon.  QRS does not appear wide enough for him to benefit from CRT.  - Continue current medication regimen, he is at goal on GDMT except for Coreg, do not think BP is high enough to increase today.  - I will arrange for repeat echo in 3 months at followup.   Loralie Champagne 03/04/2022

## 2022-03-05 ENCOUNTER — Ambulatory Visit (INDEPENDENT_AMBULATORY_CARE_PROVIDER_SITE_OTHER): Payer: Medicare HMO | Admitting: *Deleted

## 2022-03-05 DIAGNOSIS — I513 Intracardiac thrombosis, not elsewhere classified: Secondary | ICD-10-CM | POA: Diagnosis not present

## 2022-03-05 DIAGNOSIS — I4891 Unspecified atrial fibrillation: Secondary | ICD-10-CM

## 2022-03-05 LAB — POCT INR: INR: 2.4 (ref 2.0–3.0)

## 2022-03-05 NOTE — Patient Instructions (Signed)
Description   Today take 1.5 tablets then continue taking 1 tablet daily except 1.5 tablets on Tuesdays and Saturdays. Recheck INR in 3 weeks. Coumadin Clinic [phone].

## 2022-03-20 DIAGNOSIS — E1169 Type 2 diabetes mellitus with other specified complication: Secondary | ICD-10-CM | POA: Diagnosis not present

## 2022-03-25 ENCOUNTER — Other Ambulatory Visit: Payer: Self-pay | Admitting: Internal Medicine

## 2022-03-26 ENCOUNTER — Ambulatory Visit: Payer: Medicare HMO | Attending: Cardiology

## 2022-03-26 DIAGNOSIS — I4891 Unspecified atrial fibrillation: Secondary | ICD-10-CM | POA: Diagnosis not present

## 2022-03-26 DIAGNOSIS — I513 Intracardiac thrombosis, not elsewhere classified: Secondary | ICD-10-CM

## 2022-03-26 LAB — POCT INR: INR: 3.8 — AB (ref 2.0–3.0)

## 2022-03-26 NOTE — Patient Instructions (Signed)
Description   Skip today's dosage of Warfarin, then resume same dosage of Warfarin 1 tablet daily except 1.5 tablets on Tuesdays and Saturdays. Recheck INR in 3 weeks. Coumadin Clinic [phone].

## 2022-04-10 ENCOUNTER — Other Ambulatory Visit: Payer: Self-pay | Admitting: Internal Medicine

## 2022-04-14 ENCOUNTER — Telehealth (HOSPITAL_COMMUNITY): Payer: Self-pay

## 2022-04-14 ENCOUNTER — Other Ambulatory Visit (HOSPITAL_COMMUNITY): Payer: Self-pay

## 2022-04-14 NOTE — Telephone Encounter (Addendum)
Advanced Heart Failure Patient Advocate Encounter  Received renew notification for Jardiance [facility] Cares). Entresto [employer]) is also scheduled to end assistance on 06/15/22.  This patient is eligible for a grant that would cover the cost of both medications for the 2024 year. Will enroll for the grant program in November, and provide patient with billing information.  Confirmed with patient by phone. Patient stated a preference for using [place], CPhT Rx Patient Advocate Phone: [phone]

## 2022-04-17 ENCOUNTER — Other Ambulatory Visit (HOSPITAL_COMMUNITY): Payer: Self-pay | Admitting: *Deleted

## 2022-04-17 ENCOUNTER — Ambulatory Visit: Payer: Medicare HMO | Attending: Cardiology

## 2022-04-17 ENCOUNTER — Other Ambulatory Visit: Payer: Self-pay

## 2022-04-17 DIAGNOSIS — I4891 Unspecified atrial fibrillation: Secondary | ICD-10-CM | POA: Diagnosis not present

## 2022-04-17 DIAGNOSIS — I513 Intracardiac thrombosis, not elsewhere classified: Secondary | ICD-10-CM

## 2022-04-17 DIAGNOSIS — I428 Other cardiomyopathies: Secondary | ICD-10-CM

## 2022-04-17 DIAGNOSIS — Z5181 Encounter for therapeutic drug level monitoring: Secondary | ICD-10-CM

## 2022-04-17 LAB — POCT INR: INR: 2.9 (ref 2.0–3.0)

## 2022-04-17 MED ORDER — ENTRESTO 97-103 MG PO TABS: 1.0000 | ORAL_TABLET | Freq: Two times a day (BID) | ORAL | 3 refills | Status: DC

## 2022-04-17 MED ORDER — EMPAGLIFLOZIN 10 MG PO TABS: 10.0000 mg | ORAL_TABLET | Freq: Every day | ORAL | 5 refills | Status: DC

## 2022-04-17 NOTE — Patient Instructions (Signed)
Continue 1 tablet daily except 1.5 tablets on Tuesdays and Saturdays. Recheck INR in 5 weeks. Coumadin Clinic [phone]. ICD 11/27, HOLD COUMADIN 11/26

## 2022-04-17 NOTE — Telephone Encounter (Signed)
Advanced Heart Failure Patient Advocate Encounter  The patient was approved for a [employer] grant that will help cover the cost of Entresto, Jardiance.  Total amount awarded, $10,000.  Effective: 03/18/2022 - 03/18/2023.  BIN [id number] PCN [id number] [id number] ID [id number]  [facility] preference. Spoke to patient on the phone, approval and processing information emailed to patient.  Clista Bernhardt, CPhT Rx Patient Advocate Phone: [phone]

## 2022-04-18 ENCOUNTER — Telehealth: Payer: Self-pay

## 2022-04-18 LAB — BASIC METABOLIC PANEL
BUN/Creatinine Ratio: 12 (ref 10–24)
BUN: 16 mg/dL (ref 8–27)
CO2: 27 mmol/L (ref 20–29)
Calcium: 9.5 mg/dL (ref 8.6–10.2)
Chloride: 100 mmol/L (ref 96–106)
Creatinine, Ser: 1.36 mg/dL — ABNORMAL HIGH (ref 0.76–1.27)
Glucose: 100 mg/dL — ABNORMAL HIGH (ref 70–99)
Potassium: 4.3 mmol/L (ref 3.5–5.2)
Sodium: 140 mmol/L (ref 134–144)
eGFR: 57 mL/min/{1.73_m2} — ABNORMAL LOW (ref >59)

## 2022-04-18 LAB — CBC WITH DIFFERENTIAL/PLATELET
Basophils Absolute: 0.1 10*3/uL (ref 0.0–0.2)
Basos: 1 %
EOS (ABSOLUTE): 0.2 10*3/uL (ref 0.0–0.4)
Eos: 3 %
Hematocrit: 52.7 % — ABNORMAL HIGH (ref 37.5–51.0)
Hemoglobin: 17.8 g/dL — ABNORMAL HIGH (ref 13.0–17.7)
Immature Grans (Abs): 0 10*3/uL (ref 0.0–0.1)
Immature Granulocytes: 0 %
Lymphocytes Absolute: 1.2 10*3/uL (ref 0.7–3.1)
Lymphs: 19 %
MCH: 30.9 pg (ref 26.6–33.0)
MCHC: 33.8 g/dL (ref 31.5–35.7)
MCV: 92 fL (ref 79–97)
Monocytes Absolute: 0.6 10*3/uL (ref 0.1–0.9)
Monocytes: 9 %
Neutrophils Absolute: 4.4 10*3/uL (ref 1.4–7.0)
Neutrophils: 68 %
Platelets: 191 10*3/uL (ref 150–450)
RBC: 5.76 x10E6/uL (ref 4.14–5.80)
RDW: 12.3 % (ref 11.6–15.4)
WBC: 6.5 10*3/uL (ref 3.4–10.8)

## 2022-04-18 NOTE — Telephone Encounter (Signed)
Pt contacted office to advise [facility] can not refill Ozempic. Sample labeled for pt to pick up.

## 2022-04-22 ENCOUNTER — Encounter: Payer: Medicare HMO | Admitting: Internal Medicine

## 2022-04-23 NOTE — Telephone Encounter (Signed)
Patient picked up sample 

## 2022-04-28 ENCOUNTER — Other Ambulatory Visit (HOSPITAL_COMMUNITY): Payer: Self-pay

## 2022-04-28 MED ORDER — ENTRESTO 97-103 MG PO TABS: 1.0000 | ORAL_TABLET | Freq: Two times a day (BID) | ORAL | 3 refills | Status: DC

## 2022-05-07 ENCOUNTER — Other Ambulatory Visit: Payer: Self-pay | Admitting: Cardiology

## 2022-05-07 DIAGNOSIS — I480 Paroxysmal atrial fibrillation: Secondary | ICD-10-CM

## 2022-05-09 NOTE — Pre-Procedure Instructions (Signed)
Instructed patient on the following items: Arrival time 0530 Nothing to eat or drink after midnight No meds AM of procedure Responsible person to drive you home and stay with you for 24 hrs Wash with special soap night before and morning of procedure If on anti-coagulant drug instructions Coumadin-last dose 11/25

## 2022-05-12 ENCOUNTER — Ambulatory Visit (HOSPITAL_COMMUNITY): Payer: Medicare HMO

## 2022-05-12 ENCOUNTER — Other Ambulatory Visit: Payer: Self-pay

## 2022-05-12 ENCOUNTER — Ambulatory Visit (HOSPITAL_COMMUNITY)
Admission: RE | Disposition: A | Discharge status: Home / Self Care | Payer: Medicare HMO | Source: Ambulatory Visit | Attending: Internal Medicine

## 2022-05-12 ENCOUNTER — Ambulatory Visit (HOSPITAL_COMMUNITY)
Admission: RE | Admit: 2022-05-12 | Discharge: 2022-05-12 | Disposition: A | Discharge status: Home / Self Care | Payer: Medicare HMO | Source: Ambulatory Visit | Attending: Internal Medicine | Admitting: Internal Medicine

## 2022-05-12 DIAGNOSIS — E785 Hyperlipidemia, unspecified: Secondary | ICD-10-CM | POA: Diagnosis not present

## 2022-05-12 DIAGNOSIS — I5022 Chronic systolic (congestive) heart failure: Secondary | ICD-10-CM | POA: Diagnosis not present

## 2022-05-12 DIAGNOSIS — Z95810 Presence of automatic (implantable) cardiac defibrillator: Secondary | ICD-10-CM | POA: Diagnosis not present

## 2022-05-12 DIAGNOSIS — I480 Paroxysmal atrial fibrillation: Secondary | ICD-10-CM | POA: Insufficient documentation

## 2022-05-12 DIAGNOSIS — I2510 Atherosclerotic heart disease of native coronary artery without angina pectoris: Secondary | ICD-10-CM | POA: Diagnosis not present

## 2022-05-12 DIAGNOSIS — I429 Cardiomyopathy, unspecified: Secondary | ICD-10-CM

## 2022-05-12 DIAGNOSIS — I428 Other cardiomyopathies: Secondary | ICD-10-CM | POA: Insufficient documentation

## 2022-05-12 DIAGNOSIS — I110 Hypertensive heart disease with heart failure: Secondary | ICD-10-CM | POA: Diagnosis not present

## 2022-05-12 HISTORY — PX: ICD IMPLANT: EP1208

## 2022-05-12 LAB — PROTIME-INR
INR: 3.4 — ABNORMAL HIGH (ref 0.8–1.2)
Prothrombin Time: 34 seconds — ABNORMAL HIGH (ref 11.4–15.2)

## 2022-05-12 LAB — GLUCOSE, CAPILLARY
Glucose-Capillary: 132 mg/dL — ABNORMAL HIGH (ref 70–99)
Glucose-Capillary: 145 mg/dL — ABNORMAL HIGH (ref 70–99)

## 2022-05-12 SURGERY — ICD IMPLANT

## 2022-05-12 MED ORDER — HEPARIN (PORCINE) IN NACL 1000-0.9 UT/500ML-% IV SOLN
INTRAVENOUS | Status: DC | PRN
  Administered 2022-05-12: 500 mL

## 2022-05-12 MED ORDER — CEFAZOLIN SODIUM-DEXTROSE 2-4 GM/100ML-% IV SOLN
INTRAVENOUS | Status: AC
  Filled 2022-05-12: qty 100

## 2022-05-12 MED ORDER — LIDOCAINE HCL (PF) 1 % IJ SOLN
INTRAMUSCULAR | Status: DC | PRN
  Administered 2022-05-12: 50 mL

## 2022-05-12 MED ORDER — POVIDONE-IODINE 10 % EX SWAB
2.0000 | Freq: Once | CUTANEOUS | Status: AC
  Administered 2022-05-12: 2 via TOPICAL

## 2022-05-12 MED ORDER — SODIUM CHLORIDE 0.9 % IV SOLN: INTRAVENOUS | Status: DC

## 2022-05-12 MED ORDER — CHLORHEXIDINE GLUCONATE 4 % EX LIQD: 4.0000 | Freq: Once | CUTANEOUS | Status: DC

## 2022-05-12 MED ORDER — FENTANYL CITRATE (PF) 100 MCG/2ML IJ SOLN
INTRAMUSCULAR | Status: AC
  Filled 2022-05-12: qty 2

## 2022-05-12 MED ORDER — MIDAZOLAM HCL 5 MG/5ML IJ SOLN
INTRAMUSCULAR | Status: AC
  Filled 2022-05-12: qty 5

## 2022-05-12 MED ORDER — SODIUM CHLORIDE 0.9 % IV SOLN: 80.0000 mg | INTRAVENOUS | Status: DC

## 2022-05-12 MED ORDER — SODIUM CHLORIDE 0.9 % IV SOLN
INTRAVENOUS | Status: AC
  Filled 2022-05-12: qty 2

## 2022-05-12 MED ORDER — MIDAZOLAM HCL 5 MG/5ML IJ SOLN
INTRAMUSCULAR | Status: DC | PRN
  Administered 2022-05-12: 1 mg via INTRAVENOUS
  Administered 2022-05-12: 2 mg via INTRAVENOUS

## 2022-05-12 MED ORDER — LIDOCAINE HCL 1 % IJ SOLN
INTRAMUSCULAR | Status: AC
  Filled 2022-05-12: qty 20

## 2022-05-12 MED ORDER — HEPARIN (PORCINE) IN NACL 1000-0.9 UT/500ML-% IV SOLN
INTRAVENOUS | Status: AC
  Filled 2022-05-12: qty 500

## 2022-05-12 MED ORDER — LIDOCAINE HCL (PF) 1 % IJ SOLN
INTRAMUSCULAR | Status: AC
  Filled 2022-05-12: qty 30

## 2022-05-12 MED ORDER — CEFAZOLIN SODIUM-DEXTROSE 2-4 GM/100ML-% IV SOLN
2.0000 g | INTRAVENOUS | Status: AC
  Administered 2022-05-12: 2 g via INTRAVENOUS

## 2022-05-12 MED ORDER — ACETAMINOPHEN 325 MG PO TABS: 325.0000 mg | ORAL_TABLET | ORAL | Status: DC | PRN

## 2022-05-12 MED ORDER — ONDANSETRON HCL 4 MG/2ML IJ SOLN: 4.0000 mg | Freq: Four times a day (QID) | INTRAMUSCULAR | Status: DC | PRN

## 2022-05-12 MED ORDER — CEFAZOLIN SODIUM-DEXTROSE 1-4 GM/50ML-% IV SOLN
1.0000 g | Freq: Once | INTRAVENOUS | Status: AC
  Administered 2022-05-12: 1 g via INTRAVENOUS
  Filled 2022-05-12: qty 50

## 2022-05-12 MED ORDER — FENTANYL CITRATE (PF) 100 MCG/2ML IJ SOLN
INTRAMUSCULAR | Status: DC | PRN
  Administered 2022-05-12: 12.5 ug via INTRAVENOUS
  Administered 2022-05-12: 25 ug via INTRAVENOUS

## 2022-05-12 SURGICAL SUPPLY — 6 items
CABLE SURGICAL S-101-97-12 (CABLE) ×2 IMPLANT
ICD ACTICOR DX (ICD Generator) IMPLANT
LEAD PLEXA 65/15 (Lead) IMPLANT
PAD DEFIB RADIO PHYSIO CONN (PAD) ×2 IMPLANT
SHEATH 8FR PRELUDE SNAP 13 (SHEATH) IMPLANT
TRAY PACEMAKER INSERTION (PACKS) ×2 IMPLANT

## 2022-05-12 NOTE — Discharge Instructions (Signed)
After Your ICD (Implantable Cardiac Defibrillator)   You have a [employer] ICD  ACTIVITY Do not lift your arm above shoulder height for 1 week after your procedure. After 7 days, you may progress as below.  You should remove your sling 24 hours after your procedure, unless otherwise instructed by your provider.     Monday May 19, 2022  Tuesday May 20, 2022 Wednesday May 21, 2022 Thursday May 22, 2022   Do not lift, push, pull, or carry anything over 10 pounds with the affected arm until 6 weeks (Monday June 23, 2022 ) after your procedure.   You may drive AFTER your wound check, unless you have been told otherwise by your provider.   Ask your healthcare provider when you can go back to work   INCISION/Dressing If you are on a blood thinner such as Coumadin, Xarelto, Eliquis, Plavix, or Pradaxa please confirm with your provider when this should be resumed.   If large square, outer bandage is left in place, this can be removed after 24 hours from your procedure. Do not remove steri-strips or glue as below.   Monitor your defibrillator site for redness, swelling, and drainage. Call the device clinic at [phone] if you experience these symptoms or fever/chills.  If your incision is sealed with Steri-strips or staples, you may shower 7 days after your procedure or when told by your provider. Do not remove the steri-strips or let the shower hit directly on your site. You may wash around your site with soap and water.    If you were discharged in a sling, please do not wear this during the day more than 48 hours after your surgery unless otherwise instructed. This may increase the risk of stiffness and soreness in your shoulder.   Avoid lotions, ointments, or perfumes over your incision until it is well-healed.  You may use a hot tub or a pool AFTER your wound check appointment if the incision is completely closed.  Your ICD is designed to protect you from life  threatening heart rhythms. Because of this, you may receive a shock.   1 shock with no symptoms:  Call the office during business hours. 1 shock with symptoms (chest pain, chest pressure, dizziness, lightheadedness, shortness of breath, overall feeling unwell):  Call 911. If you experience 2 or more shocks in 24 hours:  Call 911. If you receive a shock, you should not drive for 6 months per the [place] DMV IF you receive appropriate therapy from your ICD.   ICD Alerts:  Some alerts are vibratory and others beep. These are NOT emergencies. Please call our office to let us know. If this occurs at night or on weekends, it can wait until the next business day. Send a remote transmission.  If your device is capable of reading fluid status (for heart failure), you will be offered monthly monitoring to review this with you.   DEVICE MANAGEMENT Remote monitoring is used to monitor your ICD from home. This monitoring is scheduled every 91 days by our office. It allows [place] to keep an eye on the functioning of your device to ensure it is working properly. You will routinely see your Electrophysiologist annually (more often if necessary).   You should receive your ID card for your new device in 4-8 weeks. Keep this card with you at all times once received. Consider wearing a medical alert bracelet or necklace.  Your ICD  may be MRI compatible. This will be discussed at your next  office visit/wound check.  You should avoid contact with strong electric or magnetic fields.   Do not use amateur (ham) radio equipment or electric (arc) welding torches. MP3 player headphones with magnets should not be used. Some devices are safe to use if held at least 12 inches (30 cm) from your defibrillator. These include power tools, lawn mowers, and speakers. If you are unsure if something is safe to use, ask your health care provider.  When using your cell phone, hold it to the ear that is on the opposite side from the  defibrillator. Do not leave your cell phone in a pocket over the defibrillator.  You may safely use electric blankets, heating pads, computers, and microwave ovens.  Call the office right away if: You have chest pain. You feel more than one shock. You feel more short of breath than you have felt before. You feel more light-headed than you have felt before. Your incision starts to open up.  This information is not intended to replace advice given to you by your health care provider. Make sure you discuss any questions you have with your health care provider.

## 2022-05-12 NOTE — H&P (Signed)
I have seen [name] is a 66 y.o. male in the office today who has been referred by Dr. Aundra Dubin for consideration of ICD implant for primary prevention of sudden death.  The patient's chart has been reviewed and they meet criteria for ICD implant.  I have had a thorough discussion with the patient reviewing options.  The patient and their family (if available) have had opportunities to ask questions and have them answered. The patient and I have decided together through a shared decision making process to proceed ICD at this time.  Risks, benefits, alternatives to ICD implantation were discussed in detail with the patient today. The patient  understands that the risks include but are not limited to bleeding, infection, pneumothorax, perforation, tamponade, vascular damage, renal failure, MI, stroke, death, inappropriate shocks, and lead dislodgement and he wishes to proceed.  We will therefore schedule device implantation at the next available time.  Carleene Overlie Suzanne Kho,MD

## 2022-05-12 NOTE — H&P (Signed)
HPI [name] is referred by Dr. Aundra Dubin for consideration for ICD insertion. He is a pleasant 66 yo man with a h/o atrial fib s/p ablation, HTN, dyslipidemia, who has developed chronic systolic heart failure due to a non-ischemic CM. He has not had syncope. He denies chest pain. He has class 2 symptoms. He underwent left heart cath with minimal disease. His EF by echo was 30-35% 6 months ago. He has on GDMT under the direction of Dr. Aundra Dubin.   Not on File           Current Outpatient Medications  Medication Sig Dispense Refill   albuterol (PROVENTIL HFA;VENTOLIN HFA) 108 (90 Base) MCG/ACT inhaler Inhale 2 puffs into the lungs every 6 (six) hours as needed for wheezing or shortness of breath.       BD PEN NEEDLE NANO 2ND GEN 32G X 4 MM MISC USE 4 TIMES A DAY AS DIRECTED       BD PEN NEEDLE NANO 2ND GEN 32G X 4 MM MISC USE 4 TIMES A DAY AS DIRECTED 100 each 4   budesonide-formoterol (SYMBICORT) 80-4.5 MCG/ACT inhaler Inhale 2 puffs into the lungs daily as needed (Shortness of breath).       carvedilol (COREG) 12.5 MG tablet Take 1.5 tablets (18.75 mg total) by mouth 2 (two) times daily with a meal. 270 tablet 3   cholecalciferol (VITAMIN D3) 25 MCG (1000 UNIT) tablet Take 1,000 Units by mouth daily.       Continuous Blood Gluc Receiver (FREESTYLE LIBRE 14 DAY READER) DEVI USE AS DIRECTED 1 each 11   doxylamine, Sleep, (UNISOM) 25 MG tablet Take 25 mg by mouth at bedtime.       empagliflozin (JARDIANCE) 10 MG TABS tablet Take 1 tablet (10 mg total) by mouth daily. 30 tablet 5   furosemide (LASIX) 20 MG tablet Take 0.5 tablets (10 mg total) by mouth 2 (two) times daily. 30 tablet 2   Insulin Degludec (TRESIBA FLEXTOUCH [place]) Inject 32 Units into the skin daily.       levothyroxine (SYNTHROID) 50 MCG tablet TAKE 1 TABLET BY MOUTH EVERY DAY BEFORE BREAKFAST 90 tablet 2   mometasone (NASONEX) 50 MCG/ACT nasal spray Place 2 sprays into the nose daily as needed (allergies or rhinitis).        Multiple Vitamins-Minerals (MULTIVITAMIN WITH MINERALS) tablet Take 2 tablets by mouth at bedtime. Chewable       NOVOLOG FLEXPEN 100 UNIT/ML FlexPen INJECT 8-12 UNITS INTO THE SKIN 3 (THREE) TIMES DAILY WITH MEALS. 30 mL 3   sacubitril-valsartan (ENTRESTO) 97-103 MG Take 1 tablet by mouth 2 (two) times daily. 180 tablet 3   Semaglutide,0.25 or 0.'5MG'$ /DOS, (OZEMPIC, 0.25 OR 0.5 MG/DOSE,) 2 MG/3ML SOPN Inject 0.5 mg into the skin once a week. 9 mL 1   spironolactone (ALDACTONE) 25 MG tablet TAKE 1 TABLET BY MOUTH EVERY DAY 90 tablet 1   vitamin C (ASCORBIC ACID) 500 MG tablet Take 500 mg by mouth in the morning.       warfarin (COUMADIN) 5 MG tablet TAKE 1 TABLET TO 1 AND 1/2 TABLETS BY MOUTH DAILY AS DIRECTED BY THE COUMADIN CLINIC. 135 tablet 1    No current facility-administered medications for this visit.            Past Medical History:  Diagnosis Date   Asthma      as a teenager - does not use an inhaler although pt said he  has an albuteral inhaler   Atrial fibrillation (HCC)      persistant 02/2009   Atrial flutter ([place])      s/p CTI ablation 08/06/10   Cataract     Chronic rhinitis     Colon polyp     Congestive heart failure ([place])     Diverticulosis      colonoscopy 04/03/2009   Hyperlipidemia     Hypertension     Morbid obesity ([place])      target weight = 219  for BMI < 30   Nonischemic cardiomyopathy (HCC)      tachycardia mediated   Seasonal allergies     Sleep apnea      original 2001 - wears C-PaP   Type 2 diabetes mellitus ([place])        ROS:    All systems reviewed and negative except as noted in the HPI.          Past Surgical History:  Procedure Laterality Date   ATRIAL FIBRILLATION ABLATION N/A 12/14/2018    Procedure: ATRIAL FIBRILLATION ABLATION;  Surgeon: Thompson Grayer, MD;  Location: [facility];  Service: Cardiovascular;  Laterality: N/A;   ATRIAL FIBRILLATION ABLATION N/A 10/18/2020    Procedure: ATRIAL FIBRILLATION ABLATION;  Surgeon:  Thompson Grayer, MD;  Location: [facility];  Service: Cardiovascular;  Laterality: N/A;   atrial flutter ablation   08/06/10   CARDIOVERSION N/A 07/05/2018    Procedure: CARDIOVERSION;  Surgeon: Buford Dresser, MD;  Location: [facility] ENDOSCOPY;  Service: Cardiovascular;  Laterality: N/A;   CARDIOVERSION N/A 08/08/2020    Procedure: CARDIOVERSION;  Surgeon: Larey Dresser, MD;  Location: [facility] ENDOSCOPY;  Service: Cardiovascular;  Laterality: N/A;   CARDIOVERSION N/A 08/29/2020    Procedure: CARDIOVERSION;  Surgeon: Jolaine Artist, MD;  Location: [facility];  Service: Cardiovascular;  Laterality: N/A;   COLONOSCOPY       POLYPECTOMY       RIGHT HEART CATH N/A 08/06/2020    Procedure: RIGHT HEART CATH;  Surgeon: Larey Dresser, MD;  Location: [facility];  Service: Cardiovascular;  Laterality: N/A;   RIGHT/LEFT HEART CATH AND CORONARY ANGIOGRAPHY N/A 11/01/2021    Procedure: RIGHT/LEFT HEART CATH AND CORONARY ANGIOGRAPHY;  Surgeon: Larey Dresser, MD;  Location: [facility];  Service: Cardiovascular;  Laterality: N/A;   TEE WITHOUT CARDIOVERSION N/A 12/14/2018    Procedure: TRANSESOPHAGEAL ECHOCARDIOGRAM (TEE);  Surgeon: Thompson Grayer, MD;  Location: [facility];  Service: Cardiovascular;  Laterality: N/A;   TEE WITHOUT CARDIOVERSION N/A 08/03/2020    Procedure: TRANSESOPHAGEAL ECHOCARDIOGRAM (TEE);  Surgeon: Sanda Klein, MD;  Location: [facility];  Service: Cardiovascular;  Laterality: N/A;   TEE WITHOUT CARDIOVERSION N/A 08/08/2020    Procedure: TRANSESOPHAGEAL ECHOCARDIOGRAM (TEE);  Surgeon: Larey Dresser, MD;  Location: [facility] ENDOSCOPY;  Service: Cardiovascular;  Laterality: N/A;   TEE WITHOUT CARDIOVERSION N/A 10/18/2020    Procedure: TRANSESOPHAGEAL ECHOCARDIOGRAM (TEE);  Surgeon: Jerline Pain, MD;  Location: [facility] ENDOSCOPY;  Service: Cardiovascular;  Laterality: N/A;             Family History  Problem Relation Age of Onset   Hypertension Mother      Kidney failure Mother          Due to sepsis   COPD Father     Colon cancer Brother          dx in 74's   Esophageal cancer Neg Hx  Rectal cancer Neg Hx     Stomach cancer Neg Hx          Social History         Socioeconomic History   Marital status: Single      Spouse name: Not on file   Number of children: 0   Years of education: Not on file   Highest education level: Not on file  Occupational History   Occupation: Sales  Tobacco Use   Smoking status: Never   Smokeless tobacco: Never  Vaping Use   Vaping Use: Never used  Substance and Sexual Activity   Alcohol use: Yes      Alcohol/week: 21.0 standard drinks      Types: 21 Cans of beer per week      Comment: previously quite heavy, working on cessation    Drug use: No   Sexual activity: Not on file  Other Topics Concern   Not on file  Social History Narrative    Lives in [place]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    Social Determinants of Health       Financial Resource Strain: Low Risk    Difficulty of Paying Living Expenses: Not hard at all  Food Insecurity: No Food Insecurity   Worried About Charity fundraiser in the Last Year: Never true   Arboriculturist in the Last Year: Never true  Transportation Needs: No Transportation Needs   Lack of Transportation (Medical): No   Lack of Transportation (Non-Medical): No  Physical Activity: Sufficiently Active   Days of Exercise per Week: 5 days   Minutes of Exercise per Session: 30 min  Stress: No Stress Concern Present   Feeling of Stress : Not at all  Social Connections: Unknown   Frequency of Communication with Friends and Family: More than three times a week   Frequency of Social Gatherings with Friends and Family: Twice a week   Attends Religious Services: Patient refused   Marine scientist or Organizations: Patient refused   Attends Music therapist: Patient refused   Marital Status: Never married  Human resources officer Violence: Not At  Risk   Fear of Current or Ex-Partner: No   Emotionally Abused: No   Physically Abused: No   Sexually Abused: No        BP 114/76   Pulse 67   Ht '6\' 1"'$  (1.854 m)   Wt 267 lb 12.8 oz (121.5 kg)   SpO2 94%   BMI 35.33 kg/m    Physical Exam:   Well appearing NAD HEENT: Unremarkable Neck:  No JVD, no thyromegally Lymphatics:  No adenopathy Back:  No CVA tenderness Lungs:  Clear HEART:  Regular rate rhythm, no murmurs, no rubs, no clicks Abd:  soft, positive bowel sounds, no organomegally, no rebound, no guarding Ext:  2 plus pulses, no edema, no cyanosis, no clubbing Skin:  No rashes no nodules Neuro:  CN II through XII intact, motor grossly intact   EKG - nsr with IVCD     Assess/Plan:  Chronic systolic heart failure - I have discussed the indications/risks/benefits/goals/expectations of ICD insertion with the patient and he will call us if he wishes to proceed. 2. HTN - his bp is controlled on medical therapy. We will follow.   Carleene Overlie Jinger [place],MD

## 2022-05-13 ENCOUNTER — Encounter (HOSPITAL_COMMUNITY): Payer: Self-pay | Admitting: Internal Medicine

## 2022-05-13 MED FILL — Lidocaine HCl Local Preservative Free (PF) Inj 1%: INTRAMUSCULAR | Qty: 30 | Status: AC

## 2022-05-13 MED FILL — Gentamicin Sulfate Inj 40 MG/ML: INTRAMUSCULAR | Qty: 80 | Status: AC

## 2022-05-14 ENCOUNTER — Telehealth: Payer: Self-pay

## 2022-05-14 ENCOUNTER — Telehealth (HOSPITAL_COMMUNITY): Payer: Self-pay | Admitting: *Deleted

## 2022-05-14 NOTE — Telephone Encounter (Signed)
Follow-up after same day discharge: Implant date: 05/12/2022 MD: Cristopher Peru, MD Device: Biotronik [id number] Acticor 7 VR-T DX Location: Left Chest   Wound check visit: 05/22/2022 @ 2:00 PM 90 day MD follow-up: No, will forward to scheduling   Remote Transmission received:Yes  Dressing/sling removed: Yes  Confirm [place] restart on: 05/14/2022

## 2022-05-14 NOTE — Telephone Encounter (Signed)
-----   Message from Shirley Friar, PA-C sent at 05/12/2022  3:24 PM EST ----- Regarding: Same Day Discharge ICD 05/12/22 Dr. Lovena Le

## 2022-05-20 ENCOUNTER — Ambulatory Visit: Payer: Medicare HMO | Admitting: Internal Medicine

## 2022-05-20 ENCOUNTER — Encounter: Payer: Self-pay | Admitting: Internal Medicine

## 2022-05-20 VITALS — BP 120/74 | HR 66 | Ht 73.0 in | Wt 271.4 lb

## 2022-05-20 DIAGNOSIS — E785 Hyperlipidemia, unspecified: Secondary | ICD-10-CM

## 2022-05-20 DIAGNOSIS — E039 Hypothyroidism, unspecified: Secondary | ICD-10-CM

## 2022-05-20 DIAGNOSIS — E1169 Type 2 diabetes mellitus with other specified complication: Secondary | ICD-10-CM | POA: Diagnosis not present

## 2022-05-20 LAB — POCT GLYCOSYLATED HEMOGLOBIN (HGB A1C): Hemoglobin A1C: 5.7 % — AB (ref 4.0–5.6)

## 2022-05-20 MED ORDER — NOVOLOG FLEXPEN 100 UNIT/ML ~~LOC~~ SOPN: PEN_INJECTOR | SUBCUTANEOUS | 3 refills | Status: DC

## 2022-05-20 NOTE — Patient Instructions (Addendum)
Please continue: - Jardiance 10 mg before b'fast - Ozempic 0.5 mg weekly in a.m.  - Tyler Aas U200 28 units daily  Please use: - Novolog 5-8 units before a larger meal  Please continue Levothyroxine 50 mcg daily.  Take the thyroid hormone every day, with water, at least 30 minutes before breakfast, separated by at least 4 hours from: - acid reflux medications - calcium - iron - multivitamins  Please return in 6 months.

## 2022-05-20 NOTE — Progress Notes (Signed)
Patient ID: [name], male   DOB: 09/11/1955, 66 y.o.   MRN: [id number]   HPI: [name] is a 66 y.o.-year-old male, initially referred by his PCP, Dr. Sharlet Salina, returning for follow-up for DM2, dx in 2017, insulin-dependent, uncontrolled, with complications (CHF, Afib/AFlutter, CKD, h/o calf diabetic ulcer).  Last visit 6 mo ago.  Interim history: He denies blurred vision, nausea.  He has nocturia-chronic.  Reviewed HbA1c levels Lab Results  Component Value Date   HGBA1C 5.9 (A) 11/19/2021   HGBA1C 6.0 (A) 05/16/2021   HGBA1C 6.0 (A) 11/08/2020   HGBA1C 5.3 08/04/2020   HGBA1C 5.7 (A) 06/29/2020   HGBA1C 5.3 02/24/2020   HGBA1C 14.2 (H) 09/04/2019   HGBA1C 15.0 (H) 09/03/2019   HGBA1C 6.9 (H) 03/08/2019   HGBA1C 6.7 (H) 01/31/2019   In the past, he was on: - Levemir 40 units 2x a day - Novolog 5 units + SSI 3x a day, before meals: BG 70-120: 0 Units, BG 121-150:3 units, BG 151-200:4 Units, BG 201-250:7 units BG 251-300: 11 Units, 301-350 : 15 units. If BG >350: Call your primary care doctor He was on Glipizide since 2017.  Now on:  - Jardiance 10 mg before b'fast - Ozempic 0.5 mg weekly in a.m.  - Tresiba U200 66 >> 50 >> 40 >> 32 >> 28 units daily - Novolog 8-12 units based on the size of the meal (8-12-8 units) >> 8-10 (12) before meals >> tried to stop but had to restart >> 5 to 8 units before a larger meal only  He checks his sugars more than 4 times a day with his freestyle libre CGM - from [facility]:  Previously:  Previously:   Lowest sugar was  58 (delayed lunch) >> 78 >> 76 >> 68 >> 70; he has hypoglycemia awareness in the 70s. Highest sugar was 330 >> 230 x1 >> 190 >> 199 >> 230 >> 200. He wasadmitted in 08/2019 with hyperosmolar hyperglycemic nonketotic state, and was found to have a glucose of 1186, along with hyponatremia and AKI.  An HbA1c level was very high.  Glucometer: [facility]   Pt's meals are: - Breakfast: fruit >>  >> -  almond milk with bran  flakes, berries, nuts - Lunch: salad - Dinner: salad, chili, hamburger patty, grilled chicken -moved dinner earlier - Snacks:- He saw nutrition in 2016.  -+ CKD, last BUN/creatinine:  Lab Results  Component Value Date   BUN 16 04/17/2022   BUN 27 (H) 02/25/2022   CREATININE 1.36 (H) 04/17/2022   CREATININE 1.45 (H) 02/25/2022  On Cozaar 50.  -+ HL; last set of lipids: Lab Results  Component Value Date   CHOL 230 (H) 11/19/2021   HDL 46.40 11/19/2021   LDLCALC 69 05/16/2020   LDLDIRECT 139.0 11/19/2021   TRIG 309.0 (H) 11/19/2021   CHOLHDL 5 11/19/2021  On Crestor 10.  - last eye exam was in 10/2021: No DR reportedly. [place] in [place].  -No numbness and tingling in his feet.  He has a history of right calf ulcer- he required skin graft - 2020 - had a terrible time then.  Last foot exam June 21, 2021.  Pt has no FH of DM.  Uncontrolled hypothyroidism:    Reviewed his TFTs: Lab Results  Component Value Date   TSH 1.79 11/19/2021   TSH 3.280 04/23/2021   TSH 3.401 01/09/2021   TSH 3.52 06/29/2020   TSH 4.61 (H) 05/16/2020   TSH 33.362 (H) 09/03/2019  TSH 13.910 (H) 02/02/2019   TSH 25.834 (H) 01/31/2019   TSH 4.800 (H) 05/03/2018   TSH 3.39 11/26/2017   He takes levothyroxine 50 mcg daily: - in am - fasting - now drinks black coffee - at least 30 min from b'fast - no calcium - no iron - + multivitamins later in the day - no PPIs - not on Biotin  He has a history of A. Fib and was found to have a thrombus in the left atrial appendage.  He had cardioversion in 08/2020.  He also has chronic systolic CHF.   He has a history of HTN, OSA.  Also, hereditary hemochromatosis, polycythemia. In the past he was drinking 4-5 beers every day.  Previously was drinking hard liquor.  He stopped drinking alcohol in 2021.  ROS: + See HPI  I reviewed pt's medications, allergies, PMH, social hx, family hx, and changes were documented in the history of present  illness. Otherwise, unchanged from my initial visit note.  Past Medical History:  Diagnosis Date   Asthma    as a teenager - does not use an inhaler although pt said he has an albuteral inhaler   Atrial fibrillation (HCC)    persistant 02/2009   Atrial flutter ([place])    s/p CTI ablation 08/06/10   Cataract    Chronic rhinitis    Colon polyp    Congestive heart failure ([place])    Diverticulosis    colonoscopy 04/03/2009   Hyperlipidemia    Hypertension    Morbid obesity ([place])    target weight = 219  for BMI < 30   Nonischemic cardiomyopathy (HCC)    tachycardia mediated   Seasonal allergies    Sleep apnea    original 2001 - wears C-PaP   Type 2 diabetes mellitus ([place])    Past Surgical History:  Procedure Laterality Date   ATRIAL FIBRILLATION ABLATION N/A 12/14/2018   Procedure: ATRIAL FIBRILLATION ABLATION;  Surgeon: Thompson Grayer, MD;  Location: [place];  Service: Cardiovascular;  Laterality: N/A;   ATRIAL FIBRILLATION ABLATION N/A 10/18/2020   Procedure: ATRIAL FIBRILLATION ABLATION;  Surgeon: Thompson Grayer, MD;  Location: [facility];  Service: Cardiovascular;  Laterality: N/A;   atrial flutter ablation  08/06/10   CARDIOVERSION N/A 07/05/2018   Procedure: CARDIOVERSION;  Surgeon: Buford Dresser, MD;  Location: [facility] ENDOSCOPY;  Service: Cardiovascular;  Laterality: N/A;   CARDIOVERSION N/A 08/08/2020   Procedure: CARDIOVERSION;  Surgeon: Larey Dresser, MD;  Location: [facility] ENDOSCOPY;  Service: Cardiovascular;  Laterality: N/A;   CARDIOVERSION N/A 08/29/2020   Procedure: CARDIOVERSION;  Surgeon: Jolaine Artist, MD;  Location: [place];  Service: Cardiovascular;  Laterality: N/A;   COLONOSCOPY     ICD IMPLANT N/A 05/12/2022   Procedure: ICD IMPLANT;  Surgeon: Evans Lance, MD;  Location: [place] [facility];  Service: Cardiovascular;  Laterality: N/A;   POLYPECTOMY     RIGHT HEART CATH N/A 08/06/2020   Procedure: RIGHT HEART CATH;  Surgeon: Larey Dresser, MD;  Location: [place] [facility];  Service: Cardiovascular;  Laterality: N/A;   RIGHT/LEFT HEART CATH AND CORONARY ANGIOGRAPHY N/A 11/01/2021   Procedure: RIGHT/LEFT HEART CATH AND CORONARY ANGIOGRAPHY;  Surgeon: Larey Dresser, MD;  Location: [place] [facility];  Service: Cardiovascular;  Laterality: N/A;   TEE WITHOUT CARDIOVERSION N/A 12/14/2018   Procedure: TRANSESOPHAGEAL ECHOCARDIOGRAM (TEE);  Surgeon: Thompson Grayer, MD;  Location: [place] [facility];  Service: Cardiovascular;  Laterality: N/A;  TEE WITHOUT CARDIOVERSION N/A 08/03/2020   Procedure: TRANSESOPHAGEAL ECHOCARDIOGRAM (TEE);  Surgeon: Sanda Klein, MD;  Location: [facility];  Service: Cardiovascular;  Laterality: N/A;   TEE WITHOUT CARDIOVERSION N/A 08/08/2020   Procedure: TRANSESOPHAGEAL ECHOCARDIOGRAM (TEE);  Surgeon: Larey Dresser, MD;  Location: [facility] ENDOSCOPY;  Service: Cardiovascular;  Laterality: N/A;   TEE WITHOUT CARDIOVERSION N/A 10/18/2020   Procedure: TRANSESOPHAGEAL ECHOCARDIOGRAM (TEE);  Surgeon: Jerline Pain, MD;  Location: [facility] ENDOSCOPY;  Service: Cardiovascular;  Laterality: N/A;   Social History   Socioeconomic History   Marital status: Single    Spouse name: Not on file   Number of children: 0   Years of education: Not on file   Highest education level: Not on file  Occupational History   Occupation: Sales  Tobacco Use   Smoking status: Never   Smokeless tobacco: Never  Vaping Use   Vaping Use: Never used  Substance and Sexual Activity   Alcohol use: Yes    Alcohol/week: 21.0 standard drinks of alcohol    Types: 21 Cans of beer per week    Comment: previously quite heavy, working on cessation    Drug use: No   Sexual activity: Not on file  Other Topics Concern   Not on file  Social History Narrative   Lives in [place] and works for [name]   Social Determinants of Health   Financial Resource Strain: Low Risk  (06/20/2021)   Overall Financial Resource Strain (CARDIA)     Difficulty of Paying Living Expenses: Not hard at all  Food Insecurity: No Food Insecurity (06/20/2021)   Hunger Vital Sign    Worried About Running Out of Food in the Last Year: Never true    Ran Out of Food in the Last Year: Never true  Transportation Needs: No Transportation Needs (06/20/2021)   PRAPARE - Hydrologist (Medical): No    Lack of Transportation (Non-Medical): No  Physical Activity: Sufficiently Active (06/20/2021)   Exercise Vital Sign    Days of Exercise per Week: 5 days    Minutes of Exercise per Session: 30 min  Stress: No Stress Concern Present (06/20/2021)   [place]    Feeling of Stress : Not at all  Social Connections: Unknown (06/20/2021)   Social Connection and Isolation Panel [NHANES]    Frequency of Communication with Friends and Family: More than three times a week    Frequency of Social Gatherings with Friends and Family: Twice a week    Attends Religious Services: Patient refused    Active Member of Clubs or Organizations: Patient refused    Attends Archivist Meetings: Patient refused    Marital Status: Never married  Intimate Partner Violence: Not At Risk (06/20/2021)   Humiliation, Afraid, Rape, and Kick questionnaire    Fear of Current or Ex-Partner: No    Emotionally Abused: No    Physically Abused: No    Sexually Abused: No   Current Outpatient Medications on File Prior to Visit  Medication Sig Dispense Refill   budesonide-formoterol (SYMBICORT) 80-4.5 MCG/ACT inhaler Inhale 2 puffs into the lungs daily as needed (Shortness of breath).     carvedilol (COREG) 12.5 MG tablet Take 1.5 tablets (18.75 mg total) by mouth 2 (two) times daily with a meal. 270 tablet 3   cholecalciferol (VITAMIN D3) 25 MCG (1000 UNIT) tablet Take 1,000 Units by mouth at bedtime.     Continuous Blood Gluc  Receiver (FREESTYLE LIBRE 14 DAY READER) DEVI USE AS DIRECTED 1 each 11    doxylamine, Sleep, (UNISOM) 25 MG tablet Take 50 mg by mouth at bedtime.     empagliflozin (JARDIANCE) 10 MG TABS tablet Take 1 tablet (10 mg total) by mouth daily. 30 tablet 5   furosemide (LASIX) 20 MG tablet Take 0.5 tablets (10 mg total) by mouth 2 (two) times daily. (Patient taking differently: Take 20 mg by mouth 2 (two) times daily.) 30 tablet 2   insulin aspart (NOVOLOG FLEXPEN) 100 UNIT/ML FlexPen INJECT up to 8 UNITS INTO THE SKIN 1-3 TIMES DAILY BEFORE MEALS. 15 mL 3   insulin degludec (TRESIBA FLEXTOUCH) 200 UNIT/ML FlexTouch Pen Inject 28 Units into the skin daily.     Insulin Pen Needle (BD PEN NEEDLE NANO 2ND GEN) 32G X 4 MM MISC USE 4 TIMES A DAY AS DIRECTED 100 each 4   levothyroxine (SYNTHROID) 50 MCG tablet TAKE 1 TABLET BY MOUTH EVERY DAY BEFORE BREAKFAST 90 tablet 2   mometasone (NASONEX) 50 MCG/ACT nasal spray Place 2 sprays into the nose daily as needed (allergies or rhinitis).     Multiple Vitamins-Minerals (MULTIVITAMIN WITH MINERALS) tablet Take 2 tablets by mouth at bedtime. Chewable     OZEMPIC, 0.25 OR 0.5 MG/DOSE, 2 MG/3ML SOPN INJECT 0.5 MG INTO THE SKIN ONCE A WEEK. (Patient taking differently: Inject 0.5 mg into the skin every Sunday.) 9 mL 1   rosuvastatin (CRESTOR) 10 MG tablet Take 1 tablet (10 mg total) by mouth daily. (Patient taking differently: Take 10 mg by mouth every evening.) 90 tablet 3   sacubitril-valsartan (ENTRESTO) 97-103 MG Take 1 tablet by mouth 2 (two) times daily. 180 tablet 3   spironolactone (ALDACTONE) 25 MG tablet Take 1 tablet (25 mg total) by mouth daily. 30 tablet 7   vitamin C (ASCORBIC ACID) 500 MG tablet Take 500 mg by mouth in the morning.     warfarin (COUMADIN) 5 MG tablet TAKE 1 TABLET TO 1 AND 1/2 TABLETS BY MOUTH DAILY AS DIRECTED BY THE COUMADIN CLINIC. 135 tablet 1   No current facility-administered medications on file prior to visit.   No Known Allergies Family History  Problem Relation Age of Onset   Hypertension Mother     Kidney failure Mother        Due to sepsis   COPD Father    Colon cancer Brother        dx in 47's   Esophageal cancer Neg Hx    Rectal cancer Neg Hx    Stomach cancer Neg Hx     PE: BP 120/74 (BP Location: Left Arm, Patient Position: Sitting, Cuff Size: Normal)   Pulse 66   Ht '6\' 1"'$  (1.854 m)   Wt 271 lb 6.4 oz (123.1 kg)   SpO2 99%   BMI 35.81 kg/m   Wt Readings from Last 3 Encounters:  05/20/22 271 lb 6.4 oz (123.1 kg)  05/12/22 260 lb (117.9 kg)  03/04/22 266 lb 9.6 oz (120.9 kg)   Constitutional: overweight, in NAD Eyes: EOMI, no exophthalmos ENT: no thyromegaly, no cervical lymphadenopathy Cardiovascular: RRR, No MRG Respiratory: CTA B Musculoskeletal: no deformities Skin: no rashes Neurological: + tremor with outstretched hands  ASSESSMENT: 1. DM2, insulin-dependent, uncontrolled, with complications - CHF - A. fib/a flutter - CKD - h/o Diabetic ulcer of calf - h/o HHNK - admitted with a glucose of >1000  -started on basal bolus regimen in the hospital  2.  Hypothyroidism -  Uncontrolled  3. Obesity class 2  4. HL  PLAN:  1. Patient with longstanding, uncontrolled, type 2 diabetes, insulin-dependent, on SGLT2 inhibitor, basal/bolus insulin regimen and weekly GLP-1 receptor agonist, with HbA1c significantly improved after stopping alcohol in 2021.  HbA1c decreased from 14.2% to 5.3%.  At last visit, this was slightly higher, at 5.9%.  At that time, sugars appears to be varying in the lower half of the target interval so I advised him to decrease the dose of his insulin and to actually use only before larger meals. CGM interpretation: -At today's visit, we reviewed his CGM downloads: It appears that 93% of values are in target range (goal >70%), while 7% are higher than 180 (goal <25%), and 0% are lower than 70 (goal <4%).  The calculated average blood sugar is 141.  The projected HbA1c for the next 3 months ([facility]MI) is 6.7%. -Reviewing the CGM trends, sugars are  fluctuating within a narrow range in the target goal with only occasional mild hyperglycemic values.  Upon questioning, he is taking NovoLog if sugars are high after meals, retroactively.  We discussed about taking it before a larger meal, for example during the holidays but he can also use this before eating out.  However, I advised him to use this proactively, 15 minutes before the meal, rather than after meals.  We otherwise can continue the same regimen. - I suggested to:  Patient Instructions  Please continue: - Jardiance 10 mg before b'fast - Ozempic 0.5 mg weekly in a.m.  - Tyler Aas U200 28 units daily  Please use: - Novolog 5-8 units before a larger meal  Please continue Levothyroxine 50 mcg daily.  Take the thyroid hormone every day, with water, at least 30 minutes before breakfast, separated by at least 4 hours from: - acid reflux medications - calcium - iron - multivitamins  Please return in 6 months.   - we checked his HbA1c: 5.7% (lower) - advised to check sugars at different times of the day - 4x a day, rotating check times - advised for yearly eye exams >> he is UTD - return to clinic in 6 months  2.  Hypothyroidism - latest thyroid labs reviewed with pt. >> normal: Lab Results  Component Value Date   TSH 1.79 11/19/2021  - he continues on LT4 50 mcg daily - pt feels good on this dose. - we discussed about taking the thyroid hormone every day, with water, >30 minutes before breakfast, separated by >4 hours from acid reflux medications, calcium, iron, multivitamins. Pt. is taking it correctly. - will check thyroid tests at next visit  3. Obesity class 2 -We will continue Ozempic which should also help with weight loss -He gain 5 pounds since last visit.  4. HL -Reviewed the lipid panel from 11/2021: LDL currently higher than our goal of less than 55 due to cardiovascular disease, triglycerides also high: Lab Results  Component Value Date   CHOL 230 (H)  11/19/2021   HDL 46.40 11/19/2021   LDLCALC 69 05/16/2020   LDLDIRECT 139.0 11/19/2021   TRIG 309.0 (H) 11/19/2021   CHOLHDL 5 11/19/2021  -He continues Crestor 10 mg daily without side effects.  At last visit I sent him a message asking him whether he was taking this daily (he was out) >> restarted. -We will recheck the lipid panel at next visit.  Philemon Kingdom, MD PhD [facility] Endocrinology

## 2022-05-21 ENCOUNTER — Other Ambulatory Visit: Payer: Self-pay | Admitting: Internal Medicine

## 2022-05-21 DIAGNOSIS — E1169 Type 2 diabetes mellitus with other specified complication: Secondary | ICD-10-CM

## 2022-05-22 ENCOUNTER — Ambulatory Visit: Payer: Medicare HMO | Attending: Cardiovascular Disease

## 2022-05-22 DIAGNOSIS — I5022 Chronic systolic (congestive) heart failure: Secondary | ICD-10-CM

## 2022-05-22 DIAGNOSIS — Z95810 Presence of automatic (implantable) cardiac defibrillator: Secondary | ICD-10-CM

## 2022-05-22 LAB — CUP PACEART INCLINIC DEVICE CHECK
Date Time Interrogation Session: 20231207142024
Implantable Lead Connection Status: 753985
Implantable Lead Implant Date: 20231127
Implantable Lead Location: 753860
Implantable Lead Model: 436909
Implantable Lead Serial Number: 81547891
Implantable Pulse Generator Implant Date: 20231127
Pulse Gen Model: 429525
Pulse Gen Serial Number: 84932826

## 2022-05-22 NOTE — Progress Notes (Signed)

## 2022-05-22 NOTE — Patient Instructions (Signed)

## 2022-05-23 ENCOUNTER — Ambulatory Visit: Payer: Medicare HMO | Attending: Cardiology

## 2022-05-23 DIAGNOSIS — Z5181 Encounter for therapeutic drug level monitoring: Secondary | ICD-10-CM | POA: Diagnosis not present

## 2022-05-23 DIAGNOSIS — I513 Intracardiac thrombosis, not elsewhere classified: Secondary | ICD-10-CM

## 2022-05-23 DIAGNOSIS — I4891 Unspecified atrial fibrillation: Secondary | ICD-10-CM | POA: Diagnosis not present

## 2022-05-23 LAB — POCT INR: INR: 2.9 (ref 2.0–3.0)

## 2022-05-23 NOTE — Patient Instructions (Signed)
Continue 1 tablet daily except 1.5 tablets on Tuesdays and Saturdays. Recheck INR in 6 weeks. [facility]mad[facility] [phone].

## 2022-05-29 ENCOUNTER — Ambulatory Visit (HOSPITAL_COMMUNITY)
Admission: RE | Admit: 2022-05-29 | Discharge: 2022-05-29 | Disposition: A | Discharge status: Home / Self Care | Payer: Medicare HMO | Source: Ambulatory Visit | Attending: Cardiology | Admitting: Cardiology

## 2022-05-29 ENCOUNTER — Ambulatory Visit (HOSPITAL_BASED_OUTPATIENT_CLINIC_OR_DEPARTMENT_OTHER)
Admission: RE | Admit: 2022-05-29 | Discharge: 2022-05-29 | Disposition: A | Discharge status: Home / Self Care | Payer: Medicare HMO | Source: Ambulatory Visit | Attending: Cardiology | Admitting: Cardiology

## 2022-05-29 VITALS — BP 121/78 | HR 78 | Wt 271.0 lb

## 2022-05-29 DIAGNOSIS — I5022 Chronic systolic (congestive) heart failure: Secondary | ICD-10-CM

## 2022-05-29 DIAGNOSIS — Z7984 Long term (current) use of oral hypoglycemic drugs: Secondary | ICD-10-CM | POA: Diagnosis not present

## 2022-05-29 DIAGNOSIS — Z8616 Personal history of COVID-19: Secondary | ICD-10-CM | POA: Insufficient documentation

## 2022-05-29 DIAGNOSIS — E1136 Type 2 diabetes mellitus with diabetic cataract: Secondary | ICD-10-CM | POA: Diagnosis not present

## 2022-05-29 DIAGNOSIS — G4733 Obstructive sleep apnea (adult) (pediatric): Secondary | ICD-10-CM | POA: Insufficient documentation

## 2022-05-29 DIAGNOSIS — N183 Chronic kidney disease, stage 3 unspecified: Secondary | ICD-10-CM | POA: Diagnosis not present

## 2022-05-29 DIAGNOSIS — Z86718 Personal history of other venous thrombosis and embolism: Secondary | ICD-10-CM | POA: Insufficient documentation

## 2022-05-29 DIAGNOSIS — I2510 Atherosclerotic heart disease of native coronary artery without angina pectoris: Secondary | ICD-10-CM | POA: Insufficient documentation

## 2022-05-29 DIAGNOSIS — Z7901 Long term (current) use of anticoagulants: Secondary | ICD-10-CM | POA: Diagnosis not present

## 2022-05-29 DIAGNOSIS — I130 Hypertensive heart and chronic kidney disease with heart failure and stage 1 through stage 4 chronic kidney disease, or unspecified chronic kidney disease: Secondary | ICD-10-CM | POA: Insufficient documentation

## 2022-05-29 DIAGNOSIS — Z794 Long term (current) use of insulin: Secondary | ICD-10-CM | POA: Diagnosis not present

## 2022-05-29 DIAGNOSIS — Z79899 Other long term (current) drug therapy: Secondary | ICD-10-CM | POA: Diagnosis not present

## 2022-05-29 DIAGNOSIS — E1122 Type 2 diabetes mellitus with diabetic chronic kidney disease: Secondary | ICD-10-CM | POA: Diagnosis not present

## 2022-05-29 DIAGNOSIS — I480 Paroxysmal atrial fibrillation: Secondary | ICD-10-CM | POA: Diagnosis not present

## 2022-05-29 LAB — ECHOCARDIOGRAM COMPLETE
Area-P 1/2: 2.51 cm2
MV M vel: 2.04 m/s
MV Peak grad: 16.6 mmHg
S' Lateral: 4.7 cm

## 2022-05-29 LAB — BASIC METABOLIC PANEL
Anion gap: 10 (ref 5–15)
BUN: 15 mg/dL (ref 8–23)
CO2: 27 mmol/L (ref 22–32)
Calcium: 9.4 mg/dL (ref 8.9–10.3)
Chloride: 97 mmol/L — ABNORMAL LOW (ref 98–111)
Creatinine, Ser: 1.38 mg/dL — ABNORMAL HIGH (ref 0.61–1.24)
GFR, Estimated: 56 mL/min — ABNORMAL LOW (ref >60)
Glucose, Bld: 94 mg/dL (ref 70–99)
Potassium: 3.7 mmol/L (ref 3.5–5.1)
Sodium: 134 mmol/L — ABNORMAL LOW (ref 135–145)

## 2022-05-29 LAB — BRAIN NATRIURETIC PEPTIDE: B Natriuretic Peptide: 339.7 pg/mL — ABNORMAL HIGH (ref 0.0–100.0)

## 2022-05-29 MED ORDER — PERFLUTREN LIPID MICROSPHERE
1.0000 mL | INTRAVENOUS | Status: DC | PRN
  Administered 2022-05-29: 2 mL via INTRAVENOUS

## 2022-05-29 MED ORDER — FUROSEMIDE 20 MG PO TABS: 20.0000 mg | ORAL_TABLET | Freq: Every day | ORAL | 2 refills | Status: DC

## 2022-05-29 NOTE — Patient Instructions (Signed)
DECREASE Lasix to 20 mg daily.  Labs done today, your results will be available in MyChart, we will contact you for abnormal readings.  Your physician recommends that you schedule a follow-up appointment in: 4 months  If you have any questions or concerns before your next appointment please send [place] a message through [place] or call our office at [phone].    TO LEAVE A MESSAGE FOR THE NURSE SELECT OPTION 2, PLEASE LEAVE A MESSAGE INCLUDING: YOUR NAME DATE OF BIRTH CALL BACK NUMBER REASON FOR CALL**this is important as we prioritize the call backs  YOU WILL RECEIVE A CALL BACK THE SAME DAY AS LONG AS YOU CALL BEFORE 4:00 PM  At the [facility], you and your health needs are our priority. As part of our continuing mission to provide you with exceptional heart care, we have created designated Provider Care Teams. These Care Teams include your primary Cardiologist (physician) and Advanced Practice Providers (APPs- Physician Assistants and Nurse Practitioners) who all work together to provide you with the care you need, when you need it.   You may see any of the following providers on your designated Care Team at your next follow up: Dr Glori Bickers Dr Loralie Champagne Dr. Roxana Hires, NP Lyda Jester, [place] [facility] [place], [place] Forestine Na, NP Audry Riles, PharmD   Please be sure to bring in all your medications bottles to every appointment.

## 2022-05-29 NOTE — Progress Notes (Signed)
  Echocardiogram 2D Echocardiogram has been performed.  [name] 05/29/2022, 2:08 PM

## 2022-05-30 ENCOUNTER — Telehealth: Payer: Self-pay | Admitting: Internal Medicine

## 2022-05-30 NOTE — Telephone Encounter (Signed)
N/A unable to leave a message for patient to call back to schedule Medicare Annual Wellness Visit   Last AWV  06/20/21  Please schedule at anytime with [facility] if patient calls the office back.     Any questions, please call me at [phone]

## 2022-06-01 NOTE — Progress Notes (Signed)
PCP:  Dr Sharlet Salina  Primary Cardiologist: Dr Harrell Gave HF MD: Dr Aundra Dubin  EP: Dr Rayann Heman   HPI: 66 y.o. male w/ h/o cardiomyopathy, presumed to be tachy-mediated from atrial fibrillation/atrial flutter.  Paroxsymal Afib/flutter dates back to ~2012. Had Aflutter ablation in 2012. He had several echos between 2012-2018 showing normal LVEF. Found to have reduced EF on echo in 04/2018, down to 30-35%. Was in afib at the time. 11/2018 was referred for Afib ablation but procedure was aborted after TEE showed LA thrombus, despite being on Xarelto. Anticoagulation changed to Pradaxa. He ultimately spontaneously converted back to NSR. Repeat echo 10/20 showed EF 35-40%.  Plans were made for [facility] 10/20 however had asymptomatic positive COVID test, then renal failure. Ischemic eval changed to [employer] which showed no ischemia in 10/20. Echo repeated 3/21, EF 40-45%, RV normal.    Seen in cardiology clinic, back in atrial fibrillation with RVR.  Presented for outpatient DCCV 08/03/20. TEE showed LA thrombus. DCCV canceled. EF lower on TEE ~5-10% with RV severely reduced. Admitted and started on IV Lasix and PICC line was placed. Milrinone started for low output, initial co-ox 35%. Pradaxa stopped and he was placed on coumadin w/ heparin bridge. Placed on IV amio for afib. He had repeat TEE on 08/07/20 that showed no LAA thrombus so DCCV was done successfully. Transitioned to PO amio 200 mg bid. Remained in NSR. Diuresed well w/ IV Lasix and able to wean of milrinone. Discharge weight 264 pounds.   He went back into atrial fibrillation in 3/22, had another DCCV back to NSR.  He had a TEE in 5/22 showing EF 30-35%, then had atrial fibrillation ablation.    Echo in 7/22 showed showed EF 40-45% with normal RV and moderate MR.   Patient has polycythemia and was noted to be a compound heterozygote for hereditary hemochromatosis.  Cardiac MRI was done in 11/22 showing EF 27%, moderate asymmetric septal  hypertrophy, diffuse hypokinesis worse in the septal wall, RV EF 30%, noncoronary LGE with diffuse mid-wall involvement especially in the septum, T2* sequences not suggestive of cardiac hemochromatosis, ECV 50% in the septum.  Echo in 11/22 showed EF 30-35%, severe asymmetric septal hypertrophy, no LVOT gradient or mitral valve SAM, mild RV dilation with mildly decreased RV systolic function.    Patient saw hematology, will not get therapeutic phlebotomy as long as ferritin < 200 and transferrin saturation < 75%.   PYP scan in 11/22 was not suggestive of TTR cardiac amyloidosis.  Genetic testing showed that patient was a heterozygote for MYH7 gene mutation that has been linked to hypertrophic cardiomyopathy. He is followed by Dr. Broadus John.   [facility] 5/23 showed mild nonobstructive CAD w/ 25% mLCx and 25% pRCA lesions, normal filling pressures and low but not markedly low cardiac output, CI 2.16.   Biotronik ICD placed.  Echo was done today and reviewed, EF 50% with severe basal to mid asymmetric LVH, no LVOT gradient or MV SAM, mild RVE with normal systolic function, mild-moderate MR.   He presents today for followup of CHF.  No complaints, doing well in general.  No chest pain.  No palpitations or lightheadedness.  No significant exertional dyspnea.  Weight is up about 5 lbs. He is in NSR.  Rarely drinking ETOH (prior heavy).  He plays golf without problems.    Labs (5/22): K 4.1, creatinine 1.49 Labs (9/22): hgb 20.3/HCT 60.1, K 4.9, creatinine 1.46, ferritin 116 Labs (11/22): Negative myeloma panel.  K 4.6, creatinine 1.27,  LFTs normal, TSH normal.  Labs (3/23): K 3.5, creatinine 1.4, hgb 18 Labs (6/23): K 4.2, creatinine 1.45, Hgb 18.0, Direct LDL 183 mg/dL Labs (9/23): K 4.7, creatinine 1.45, Hgb 16.5  Labs (11/23): K 4.3, creatinine 1.36, hgb 17.8  ECG (personally reviewed): NSR, LBBB 144 msec  PMH: 1. Atrial flutter: Ablation 2012.  2. Atrial fibrillation: H/o LAA thrombus on both Xarelto  and Pradaxa.  DCCV 2/22, 3/22. Atrial fibrillation ablation in 5/22.  3. Chronic systolic CHF: ?tachycardia-mediated CMP with AF/RVR.  - Cardiolite (10/20): No ischemia.  - TEE (2/22): EF 5-10% with severe RV dysfunction.  - Echo (7/22): EF 40-45%, normal RV, moderate MR.  - Cardiac MRI (11/22): EF 27%, moderate asymmetric septal hypertrophy, diffuse hypokinesis worse in the septal wall, RV EF 30%, noncoronary LGE with diffuse mid-wall involvement especially in the septum, T2* sequences not suggestive of cardiac hemochromatosis, ECV 50% in the septum.  - Echo (11/22): EF 30-35%, severe asymmetric septal hypertrophy, no LVOT gradient or mitral valve SAM, mild RV dilation with mildly decreased RV systolic function.   - PYP scan (11/22): grade 1, H/CL 0.95 (negative) - RHC/LHC (5/23): Mild nonobstructive CAD; mean RA 1, PA 20/12, PCWP 8, CI 2.16 - Genetic testing showed that patient was a heterozygote for MYH7 gene mutation that has been linked to hypertrophic cardiomyopathy.  - Echo (12/23): EF 50% with severe basal to mid asymmetric LVH, no LVOT gradient or MV SAM, mild RVE with normal systolic function, mild-moderate MR.  4. CKD stage 3 5. OSA: Uses CPAP 6. Type 2 diabetes 7. Hereditary hemochromatosis: Compound heterozygote with Cys282Tyr and His63Asp.   ROS: All systems negative except as listed in HPI, PMH and Problem List.  Social History   Socioeconomic History   Marital status: Single    Spouse name: Not on file   Number of children: 0   Years of education: Not on file   Highest education level: Not on file  Occupational History   Occupation: Sales  Tobacco Use   Smoking status: Never   Smokeless tobacco: Never  Vaping Use   Vaping Use: Never used  Substance and Sexual Activity   Alcohol use: Yes    Alcohol/week: 21.0 standard drinks of alcohol    Types: 21 Cans of beer per week    Comment: previously quite heavy, working on cessation    Drug use: No   Sexual activity:  Not on file  Other Topics Concern   Not on file  Social History Narrative   Lives in [place] and works for [name]   Social Determinants of Health   Financial Resource Strain: Low Risk  (06/20/2021)   Overall Financial Resource Strain (CARDIA)    Difficulty of Paying Living Expenses: Not hard at all  Food Insecurity: No Food Insecurity (06/20/2021)   Hunger Vital Sign    Worried About Running Out of Food in the Last Year: Never true    Ran Out of Food in the Last Year: Never true  Transportation Needs: No Transportation Needs (06/20/2021)   PRAPARE - Hydrologist (Medical): No    Lack of Transportation (Non-Medical): No  Physical Activity: Sufficiently Active (06/20/2021)   Exercise Vital Sign    Days of Exercise per Week: 5 days    Minutes of Exercise per Session: 30 min  Stress: No Stress Concern Present (06/20/2021)   [place]    Feeling of Stress : Not at  all  Social Connections: Unknown (06/20/2021)   Social Connection and Isolation Panel [NHANES]    Frequency of Communication with Friends and Family: More than three times a week    Frequency of Social Gatherings with Friends and Family: Twice a week    Attends Religious Services: Patient refused    Active Member of Clubs or Organizations: Patient refused    Attends Archivist Meetings: Patient refused    Marital Status: Never married  Intimate Partner Violence: Not At Risk (06/20/2021)   Humiliation, Afraid, Rape, and Kick questionnaire    Fear of Current or Ex-Partner: No    Emotionally Abused: No    Physically Abused: No    Sexually Abused: No    FH:  Family History  Problem Relation Age of Onset   Hypertension Mother    Kidney failure Mother        Due to sepsis   COPD Father    Colon cancer Brother        dx in 68's   Esophageal cancer Neg Hx    Rectal cancer Neg Hx    Stomach cancer Neg Hx     Past  Medical History:  Diagnosis Date   Asthma    as a teenager - does not use an inhaler although pt said he has an albuteral inhaler   Atrial fibrillation ([place])    persistant 02/2009   Atrial flutter ([place])    s/p CTI ablation 08/06/10   Cataract    Chronic rhinitis    Colon polyp    Congestive heart failure ([place])    Diverticulosis    colonoscopy 04/03/2009   Hyperlipidemia    Hypertension    Morbid obesity ([place])    target weight = 219  for BMI < 30   Nonischemic cardiomyopathy (HCC)    tachycardia mediated   Seasonal allergies    Sleep apnea    original 2001 - wears C-PaP   Type 2 diabetes mellitus (HCC)     Current Outpatient Medications  Medication Sig Dispense Refill   BD PEN NEEDLE NANO 2ND GEN 32G X 4 MM MISC USE 4 TIMES A DAY AS DIRECTED 100 each 4   budesonide-formoterol (SYMBICORT) 80-4.5 MCG/ACT inhaler Inhale 2 puffs into the lungs daily as needed (Shortness of breath).     carvedilol (COREG) 12.5 MG tablet Take 1.5 tablets (18.75 mg total) by mouth 2 (two) times daily with a meal. 270 tablet 3   cholecalciferol (VITAMIN D3) 25 MCG (1000 UNIT) tablet Take 1,000 Units by mouth at bedtime.     Continuous Blood Gluc Receiver (FREESTYLE LIBRE 14 DAY READER) DEVI USE AS DIRECTED 1 each 11   doxylamine, Sleep, (UNISOM) 25 MG tablet Take 50 mg by mouth at bedtime.     empagliflozin (JARDIANCE) 10 MG TABS tablet Take 1 tablet (10 mg total) by mouth daily. 30 tablet 5   insulin aspart (NOVOLOG FLEXPEN) 100 UNIT/ML FlexPen INJECT 8-12 UNITS INTO THE SKIN 3 (THREE) TIMES DAILY WITH MEALS. 15 mL 3   insulin degludec (TRESIBA FLEXTOUCH) 200 UNIT/ML FlexTouch Pen Inject 28 Units into the skin daily.     levothyroxine (SYNTHROID) 50 MCG tablet TAKE 1 TABLET BY MOUTH EVERY DAY BEFORE BREAKFAST 90 tablet 2   mometasone (NASONEX) 50 MCG/ACT nasal spray Place 2 sprays into the nose daily as needed (allergies or rhinitis).     Multiple Vitamins-Minerals (MULTIVITAMIN WITH MINERALS) tablet  Take 2 tablets by mouth at bedtime. Chewable  OZEMPIC, 0.25 OR 0.5 MG/DOSE, 2 MG/3ML SOPN INJECT 0.5 MG INTO THE SKIN ONCE A WEEK. 9 mL 1   rosuvastatin (CRESTOR) 10 MG tablet Take 1 tablet (10 mg total) by mouth daily. 90 tablet 3   sacubitril-valsartan (ENTRESTO) 97-103 MG Take 1 tablet by mouth 2 (two) times daily. 180 tablet 3   spironolactone (ALDACTONE) 25 MG tablet Take 1 tablet (25 mg total) by mouth daily. 30 tablet 7   vitamin C (ASCORBIC ACID) 500 MG tablet Take 500 mg by mouth in the morning.     warfarin (COUMADIN) 5 MG tablet TAKE 1 TABLET TO 1 AND 1/2 TABLETS BY MOUTH DAILY AS DIRECTED BY THE COUMADIN CLINIC. 135 tablet 1   furosemide (LASIX) 20 MG tablet Take 1 tablet (20 mg total) by mouth daily. 90 tablet 2   No current facility-administered medications for this encounter.    Vitals:   05/29/22 1429  BP: 121/78  Pulse: 78  SpO2: 92%  Weight: 122.9 kg (271 lb)   Wt Readings from Last 3 Encounters:  05/29/22 122.9 kg (271 lb)  05/20/22 123.1 kg (271 lb 6.4 oz)  05/12/22 117.9 kg (260 lb)   PHYSICAL EXAM: General: NAD Neck: No JVD, no thyromegaly or thyroid nodule.  Lungs: Clear to auscultation bilaterally with normal respiratory effort. CV: Nondisplaced PMI.  Heart regular S1/S2, no S3/S4, no murmur.  No peripheral edema.  No carotid bruit.  Normal pedal pulses.  Abdomen: Soft, nontender, no hepatosplenomegaly, no distention.  Skin: Intact without lesions or rashes.  Neurologic: Alert and oriented x 3.  Psych: Normal affect. Extremities: No clubbing or cyanosis.  HEENT: Normal.   ASSESSMENT & PLAN: 1. Chronic systolic CHF: Biotronik ICD.  TEE 2/22 with EF <10%, severe RV dysfunction, LA appendage thrombus. Echoes in the past have had EF ranging 30-45%. Cardiolite in 10/20 showed no ischemia. Initially, thought to have tachycardia-mediated cardiomyopathy as he seems to become markedly more symptomatic when in atrial fibrillation.  Alternatively, he could have  another cause for cardiomyopathy and atrial fibrillation just causes him to decompensate.  Echo in 7/22 in NSR showed EF 40-45% with normal RV, moderate MR (in NSR after atrial fibrillation ablation). He was found to be a compound heterozygote for hereditary hemochromatosis (evaluation done because of polycythemia), so I was concerned that he could have a hemochromatosis-related CMP.  However, cardiac MRI was done in 11/22, showing EF 27%, moderate asymmetric septal hypertrophy, diffuse hypokinesis worse in the septal wall, RV EF 30%, noncoronary LGE with diffuse mid-wall involvement especially in the septum, T2* sequences not suggestive of cardiac hemochromatosis, ECV 50% in the septum.  Cardiomyopathy appeared most consistent with hypertrophic cardiomyopathy (nonobstructive), less likely amyloidosis or sarcoidosis.  Myeloma panel negative and PYP scan not suggestive of TTR cardiac amyloidosis.  Genetic testing showed that patient was a heterozygote for MYH7 gene mutation that has been linked to hypertrophic cardiomyopathy => I suspect that HCM is the diagnosis here.  [facility] 5/23 showed mild nonobstructive CAD, normal filling pressures and low but not markedly low cardiac output, CI 2.16.  Echo today showed improved EF 50% with severe basal to mid asymmetric LVH, no LVOT gradient or MV SAM, mild RVE with normal systolic function, mild-moderate MR.  NYHA class I-II, not volume overloaded on exam.  - I think he can decrease Lasix to 20 mg daily.  BMET/BNP today.  - Continue Coreg 18.75 mg bid. - Continue Entresto 97/103 bid.  - Continue Jardiance 10 mg daily.  - Continue  spironolactone 25 mg daily.   - We have discussed + MYH7 gene test. He has no children but has 1 living brother. Dr. Broadus John has recommended he undergo genetic testing as well.  2. Atrial fibrillation: Paroxysmal. With LAA clots despite Xarelto and Pradaxa use, he was started on warfarin.  Will aim for INR 2.5-3.  AF ablation in 5/22. He is off  amiodarone post-ablation. EKG today shows NSR. Denies symptoms of breakthrough Afib. - Continue warfarin.  3. CKD stage 3: BMET today.  4. OSA: Reports full compliance w/ CPAP. Continue CPAP nightly.  5. Type 2 diabetes: followed by PCP  - Continue Jardiance 10 mg daily.  6. Hereditary hemochromatosis: Compound heterozygote.  T2* sequences on cardiac MRI were not suggestive of cardiac hemochromatosis.  - Therapeutic phlebotomy only if ferritin > 200 or transferrin saturation > 75%.  7. CAD: mild nonobstructive CAD on cath 5/23, 25% mLCx and 25% pRCA lesions - On Crestor, check lipids today.  - no ASA w/ warfarin  - continue ? blocker  Followup in 4 months with APP.   Loralie Champagne,   06/01/2022

## 2022-06-05 ENCOUNTER — Telehealth: Payer: Self-pay

## 2022-06-05 NOTE — Telephone Encounter (Signed)
-----   Message from Evans Lance, MD sent at 04/18/2022 11:16 PM EDT ----- No change in treatment

## 2022-06-10 DIAGNOSIS — E1169 Type 2 diabetes mellitus with other specified complication: Secondary | ICD-10-CM | POA: Diagnosis not present

## 2022-06-20 ENCOUNTER — Other Ambulatory Visit (HOSPITAL_COMMUNITY): Payer: Self-pay | Admitting: Cardiology

## 2022-06-24 ENCOUNTER — Encounter: Payer: Self-pay | Admitting: Internal Medicine

## 2022-06-24 ENCOUNTER — Ambulatory Visit (INDEPENDENT_AMBULATORY_CARE_PROVIDER_SITE_OTHER): Payer: Medicare HMO | Admitting: Internal Medicine

## 2022-06-24 VITALS — BP 125/88 | HR 69 | Temp 97.8°F | Ht 73.0 in | Wt 261.0 lb

## 2022-06-24 DIAGNOSIS — I428 Other cardiomyopathies: Secondary | ICD-10-CM | POA: Diagnosis not present

## 2022-06-24 DIAGNOSIS — J4520 Mild intermittent asthma, uncomplicated: Secondary | ICD-10-CM

## 2022-06-24 DIAGNOSIS — E1169 Type 2 diabetes mellitus with other specified complication: Secondary | ICD-10-CM | POA: Diagnosis not present

## 2022-06-24 DIAGNOSIS — I5022 Chronic systolic (congestive) heart failure: Secondary | ICD-10-CM | POA: Diagnosis not present

## 2022-06-24 DIAGNOSIS — E785 Hyperlipidemia, unspecified: Secondary | ICD-10-CM | POA: Diagnosis not present

## 2022-06-24 DIAGNOSIS — Z0000 Encounter for general adult medical examination without abnormal findings: Secondary | ICD-10-CM

## 2022-06-24 MED ORDER — MOMETASONE FUROATE 50 MCG/ACT NA SUSP: 2.0000 | Freq: Every day | NASAL | 11 refills | Status: DC | PRN

## 2022-06-24 MED ORDER — BUDESONIDE-FORMOTEROL FUMARATE 80-4.5 MCG/ACT IN AERO: 2.0000 | INHALATION_SPRAY | Freq: Every day | RESPIRATORY_TRACT | 3 refills | Status: DC | PRN

## 2022-06-24 NOTE — Progress Notes (Unsigned)
Subjective:   Patient ID: [name], male    DOB: [birth year], 67 y.o.   MRN: [id number]  HPI Here for medicare wellness and physical, no new complaints. Please see A/P for status and treatment of chronic medical problems.   Diet: DM since diabetic Physical activity: sedentary Depression/mood screen: negative Hearing: intact to whispered voice Visual acuity: grossly normal with lens, performs annual eye exam  ADLs: capable Fall risk: none Home safety: good Cognitive evaluation: intact to orientation, naming, recall and repetition EOL planning: adv directives discussed, in place  [employer] Visit from 06/24/2022 in [place] at [place] Visit from 06/24/2022 in [place] at [facility]  PHQ-9 Total Score 0         03/14/2021    1:30 PM 06/20/2021   11:29 AM 11/01/2021    5:40 AM 05/12/2022    6:09 AM 06/24/2022    1:50 PM  Fall Risk  Falls in the past year?  0   0  Was there an injury with Fall?  0   0  Fall Risk Category Calculator  0   0  Fall Risk Category  Low   Low  Patient Fall Risk Level Low fall risk Low fall risk Moderate fall risk Low fall risk   Patient at Risk for Falls Due to  No Fall Risks     Fall risk Follow up  Falls prevention discussed   Falls evaluation completed    I have personally reviewed and have noted 1. The patient's medical and social history - reviewed today no changes 2. Their use of alcohol, tobacco or illicit drugs 3. Their current medications and supplements 4. The patient's functional ability including ADL's, fall risks, home safety risks and hearing or visual impairment. 5. Diet and physical activities 6. Evidence for depression or mood disorders 7. Care team reviewed and updated 8.  The patient is not on an opioid pain medication.  Patient Care Team: [name], MD as PCP - General (Internal Medicine) Buford Dresser, MD as PCP -  Cardiology (Cardiology) Thompson Grayer, MD as PCP - Electrophysiology (Cardiology) Past Medical History:  Diagnosis Date   Asthma    as a teenager - does not use an inhaler although pt said he has an albuteral inhaler   Atrial fibrillation ([place])    persistant 02/2009   Atrial flutter ([place])    s/p CTI ablation 08/06/10   Cataract    Chronic rhinitis    Colon polyp    Congestive heart failure ([place])    Diverticulosis    colonoscopy 04/03/2009   Hyperlipidemia    Hypertension    Morbid obesity ([place])    target weight = 219  for BMI < 30   Nonischemic cardiomyopathy ([place])    tachycardia mediated   Seasonal allergies    Sleep apnea    original 2001 - wears C-PaP   Type 2 diabetes mellitus ([place])    Past Surgical History:  Procedure Laterality Date   ATRIAL FIBRILLATION ABLATION N/A 12/14/2018   Procedure: ATRIAL FIBRILLATION ABLATION;  Surgeon: Thompson Grayer, MD;  Location: [place] CV [facility];  Service: Cardiovascular;  Laterality: N/A;   ATRIAL FIBRILLATION ABLATION N/A 10/18/2020   Procedure: ATRIAL FIBRILLATION ABLATION;  Surgeon: Thompson Grayer, MD;  Location: [place] [facility];  Service: Cardiovascular;  Laterality: N/A;   atrial flutter ablation  08/06/10   CARDIOVERSION N/A 07/05/2018  Procedure: CARDIOVERSION;  Surgeon: Buford Dresser, MD;  Location: [facility] ENDOSCOPY;  Service: Cardiovascular;  Laterality: N/A;   CARDIOVERSION N/A 08/08/2020   Procedure: CARDIOVERSION;  Surgeon: Larey Dresser, MD;  Location: [facility] ENDOSCOPY;  Service: Cardiovascular;  Laterality: N/A;   CARDIOVERSION N/A 08/29/2020   Procedure: CARDIOVERSION;  Surgeon: Jolaine Artist, MD;  Location: [place];  Service: Cardiovascular;  Laterality: N/A;   COLONOSCOPY     ICD IMPLANT N/A 05/12/2022   Procedure: ICD IMPLANT;  Surgeon: Evans Lance, MD;  Location: [place] [facility];  Service: Cardiovascular;  Laterality: N/A;   POLYPECTOMY     RIGHT HEART CATH N/A 08/06/2020   Procedure: RIGHT HEART  CATH;  Surgeon: Larey Dresser, MD;  Location: [place] [facility];  Service: Cardiovascular;  Laterality: N/A;   RIGHT/LEFT HEART CATH AND CORONARY ANGIOGRAPHY N/A 11/01/2021   Procedure: RIGHT/LEFT HEART CATH AND CORONARY ANGIOGRAPHY;  Surgeon: Larey Dresser, MD;  Location: [place] [facility];  Service: Cardiovascular;  Laterality: N/A;   TEE WITHOUT CARDIOVERSION N/A 12/14/2018   Procedure: TRANSESOPHAGEAL ECHOCARDIOGRAM (TEE);  Surgeon: Thompson Grayer, MD;  Location: [place] [facility];  Service: Cardiovascular;  Laterality: N/A;   TEE WITHOUT CARDIOVERSION N/A 08/03/2020   Procedure: TRANSESOPHAGEAL ECHOCARDIOGRAM (TEE);  Surgeon: Sanda Klein, MD;  Location: [place];  Service: Cardiovascular;  Laterality: N/A;   TEE WITHOUT CARDIOVERSION N/A 08/08/2020   Procedure: TRANSESOPHAGEAL ECHOCARDIOGRAM (TEE);  Surgeon: Larey Dresser, MD;  Location: [facility] ENDOSCOPY;  Service: Cardiovascular;  Laterality: N/A;   TEE WITHOUT CARDIOVERSION N/A 10/18/2020   Procedure: TRANSESOPHAGEAL ECHOCARDIOGRAM (TEE);  Surgeon: Jerline Pain, MD;  Location: [facility] ENDOSCOPY;  Service: Cardiovascular;  Laterality: N/A;   Family History  Problem Relation Age of Onset   Hypertension Mother    Kidney failure Mother        Due to sepsis   COPD Father    Colon cancer Brother        dx in 38's   Esophageal cancer Neg Hx    Rectal cancer Neg Hx    Stomach cancer Neg Hx    Review of Systems  Objective:  Physical Exam  Vitals:   06/24/22 1344  BP: (!) 126/100  Pulse: 69  Temp: 97.8 F (36.6 C)  TempSrc: Oral  SpO2: 95%  Weight: 261 lb (118.4 kg)  Height: '6\' 1"'$  (1.854 m)    Assessment & Plan:

## 2022-06-25 NOTE — Assessment & Plan Note (Signed)
Seeing endo for management, foot exam and eye exam up to date. HgA1c at goal. No low sugars. On novolog and tresiba and jardiance and ozempic.

## 2022-06-25 NOTE — Assessment & Plan Note (Signed)
Flu shot yearly up to date. Covid-19 counseled. Pneumonia complete. Shingrix complete. Tetanus due 2026. Colonoscopy due 2025. Counseled about sun safety and mole surveillance. Counseled about the dangers of distracted driving. Given 10 year screening recommendations.

## 2022-06-25 NOTE — Assessment & Plan Note (Signed)
Needs refill on symbicort and nasonex which he uses rarely. Refilled today. No flare. Mild intermittent.

## 2022-06-25 NOTE — Assessment & Plan Note (Signed)
Recently resumed crestor 10 mg daily due to elevated lipids and will get recheck with next labs.

## 2022-06-25 NOTE — Assessment & Plan Note (Signed)
Follows with cardiology and ICD in place. On jardiance and coreg 18.75 mg BID and entresto and statin.

## 2022-06-25 NOTE — Assessment & Plan Note (Signed)
Recent ICD in place.

## 2022-07-04 ENCOUNTER — Ambulatory Visit: Payer: Medicare HMO | Attending: Cardiology | Admitting: *Deleted

## 2022-07-04 DIAGNOSIS — I4891 Unspecified atrial fibrillation: Secondary | ICD-10-CM | POA: Diagnosis not present

## 2022-07-04 DIAGNOSIS — I513 Intracardiac thrombosis, not elsewhere classified: Secondary | ICD-10-CM

## 2022-07-04 LAB — POCT INR: POC INR: 2.4

## 2022-07-04 NOTE — Patient Instructions (Signed)
Description   Take 1.5 tablets of warfarin today and then continue 1 tablet daily except 1.5 tablets on Tuesdays and Saturdays. Recheck INR in 4 weeks. Coumadin Clinic [phone].

## 2022-07-15 ENCOUNTER — Ambulatory Visit: Payer: Self-pay | Admitting: Licensed Clinical Social Worker

## 2022-07-15 NOTE — Patient Outreach (Signed)
  Care Coordination  Initial Visit Note   07/15/2022 Name: [name] MRN: [id number] DOB: 21-Apr-1956  [name] is a 67 y.o. year old male who sees [name], MD for primary care. I spoke with  [name] by phone today.  What matters to the patients health and wellness today?   Patient reports no concerns or needs from Care Coordination team with health and wellness related to physical or mental heath.  Will review information in MyChart .    Goals Addressed             This Visit's Progress    COMPLETED: Care Coordination Activites No Follow up Required       Care Coordination Interventions: Reviewed Care Coordination Services:will look at information in EPIC Assessed Social Determinants of Health            SDOH assessments and interventions completed:  Yes  SDOH Interventions Today    Flowsheet Row Most Recent Value  SDOH Interventions   Food Insecurity Interventions Intervention Not Indicated  Housing Interventions Intervention Not Indicated  Transportation Interventions Intervention Not Indicated       Care Coordination Interventions:  No, not indicated   Follow up plan: No further intervention required.   Encounter Outcome:  Pt. Visit Completed   Casimer Lanius, [place] [phone]

## 2022-07-15 NOTE — Patient Instructions (Signed)
    It was a pleasure speaking with you today.   Care Coordination provides support specific to your health needs that extend beyond exceptional routine office care you already receive from your primary care doctor.    If you are eligible for standard Care Coordination, there is no cost to you.  The Care Coordination team is made up of the following team members: Registered Nurse Care Guide: disease management, health education, care coordination and complex case management Clinical Social Work: Complex Care Coordination including coordination of level of care needs, mental and behavioral health assessment and recommendations, and connection to long-term mental health support Clinical Pharmacist: medication management, assistance and disease management Community Resource Care Guides: Forensic psychologist Team: dedicated team of scheduling professionals to support patient and clinical team scheduling needs  Please call [phone] if you would like to schedule a phone appointment with one of the team members.   Casimer Lanius, [place] [phone]

## 2022-07-24 ENCOUNTER — Encounter (HOSPITAL_COMMUNITY): Payer: Self-pay | Admitting: *Deleted

## 2022-07-30 ENCOUNTER — Other Ambulatory Visit: Payer: Self-pay | Admitting: Internal Medicine

## 2022-08-01 ENCOUNTER — Ambulatory Visit: Payer: Medicare HMO | Attending: Cardiology | Admitting: *Deleted

## 2022-08-01 DIAGNOSIS — I4891 Unspecified atrial fibrillation: Secondary | ICD-10-CM

## 2022-08-01 DIAGNOSIS — I513 Intracardiac thrombosis, not elsewhere classified: Secondary | ICD-10-CM

## 2022-08-01 LAB — POCT INR: INR: 2.3 (ref 2.0–3.0)

## 2022-08-01 NOTE — Patient Instructions (Signed)
Description   Take 1.5 tablets of warfarin today and then START taking 1 tablet daily except 1.5 tablets on Tuesdays, Thursdays, and Saturdays. Recheck INR in 4 weeks. Coumadin Clinic [phone].

## 2022-08-11 ENCOUNTER — Ambulatory Visit: Payer: Medicare HMO

## 2022-08-11 DIAGNOSIS — I428 Other cardiomyopathies: Secondary | ICD-10-CM | POA: Diagnosis not present

## 2022-08-12 LAB — CUP PACEART REMOTE DEVICE CHECK
Date Time Interrogation Session: 20240226091731
Implantable Lead Connection Status: 753985
Implantable Lead Implant Date: 20231127
Implantable Lead Location: 753860
Implantable Lead Model: 436909
Implantable Lead Serial Number: 81547891
Implantable Pulse Generator Implant Date: 20231127
Pulse Gen Model: 429525
Pulse Gen Serial Number: 84932826

## 2022-08-14 ENCOUNTER — Ambulatory Visit: Payer: Medicare HMO | Attending: Internal Medicine | Admitting: Internal Medicine

## 2022-08-14 ENCOUNTER — Encounter: Payer: Self-pay | Admitting: Internal Medicine

## 2022-08-14 VITALS — BP 114/78 | HR 68 | Ht 73.0 in | Wt 262.2 lb

## 2022-08-14 DIAGNOSIS — Z95810 Presence of automatic (implantable) cardiac defibrillator: Secondary | ICD-10-CM | POA: Diagnosis not present

## 2022-08-14 DIAGNOSIS — I5022 Chronic systolic (congestive) heart failure: Secondary | ICD-10-CM | POA: Diagnosis not present

## 2022-08-14 DIAGNOSIS — I4891 Unspecified atrial fibrillation: Secondary | ICD-10-CM

## 2022-08-14 NOTE — Progress Notes (Signed)
HPI Mr. Hauptmann returns today for ongoing ICD followup. He is a pleasant 67 yo man with a h/o atrial fib s/p ablation, HTN, dyslipidemia, who has developed chronic systolic heart failure due to a non-ischemic CM. He has not had syncope. He denies chest pain. He has class 2 symptoms. He underwent left heart cath with minimal disease. His EF by echo was 30-35% 6 months ago. He has on GDMT under the direction of Dr. Aundra Dubin. He has some residual atrial fib and has been on warfarin. No Known Allergies   Current Outpatient Medications  Medication Sig Dispense Refill   BD PEN NEEDLE NANO 2ND GEN 32G X 4 MM MISC USE 4 TIMES A DAY AS DIRECTED 100 each 4   carvedilol (COREG) 12.5 MG tablet Take 1.5 tablets (18.75 mg total) by mouth 2 (two) times daily with a meal. 270 tablet 3   cholecalciferol (VITAMIN D3) 25 MCG (1000 UNIT) tablet Take 1,000 Units by mouth at bedtime.     Continuous Blood Gluc Receiver (FREESTYLE LIBRE 14 DAY READER) DEVI USE AS DIRECTED 1 each 11   doxylamine, Sleep, (UNISOM) 25 MG tablet Take 50 mg by mouth at bedtime.     empagliflozin (JARDIANCE) 10 MG TABS tablet Take 1 tablet (10 mg total) by mouth daily. 30 tablet 5   furosemide (LASIX) 20 MG tablet Take 1 tablet (20 mg total) by mouth daily. 90 tablet 2   insulin aspart (NOVOLOG FLEXPEN) 100 UNIT/ML FlexPen INJECT 8-12 UNITS INTO THE SKIN 3 (THREE) TIMES DAILY WITH MEALS. 15 mL 3   insulin degludec (TRESIBA FLEXTOUCH) 200 UNIT/ML FlexTouch Pen INJECT 66 UNITS INTO THE SKIN DAILY. 9 mL 3   levothyroxine (SYNTHROID) 50 MCG tablet TAKE 1 TABLET BY MOUTH EVERY DAY BEFORE BREAKFAST 90 tablet 2   mometasone (NASONEX) 50 MCG/ACT nasal spray Place 2 sprays into the nose daily as needed (allergies or rhinitis). 1 each 11   Multiple Vitamins-Minerals (MULTIVITAMIN WITH MINERALS) tablet Take 2 tablets by mouth at bedtime. Chewable     OZEMPIC, 0.25 OR 0.5 MG/DOSE, 2 MG/3ML SOPN INJECT 0.5 MG INTO THE SKIN ONCE A WEEK. 9 mL 1    rosuvastatin (CRESTOR) 10 MG tablet Take 1 tablet (10 mg total) by mouth daily. 90 tablet 3   sacubitril-valsartan (ENTRESTO) 97-103 MG Take 1 tablet by mouth 2 (two) times daily. 180 tablet 3   SYMBICORT 80-4.5 MCG/ACT inhaler INHALE 2 PUFFS INTO THE LUNGS DAILY AS NEEDED (SHORTNESS OF BREATH). 30.6 each 1   vitamin C (ASCORBIC ACID) 500 MG tablet Take 500 mg by mouth in the morning.     warfarin (COUMADIN) 5 MG tablet TAKE 1 TABLET TO 1 AND 1/2 TABLETS BY MOUTH DAILY AS DIRECTED BY THE COUMADIN CLINIC. 135 tablet 1   No current facility-administered medications for this visit.     Past Medical History:  Diagnosis Date   Asthma    as a teenager - does not use an inhaler although pt said he has an albuteral inhaler   Atrial fibrillation ([place])    persistant 02/2009   Atrial flutter ([place])    s/p CTI ablation 08/06/10   Cataract    Chronic rhinitis    Colon polyp    Congestive heart failure ([place])    Diverticulosis    colonoscopy 04/03/2009   Hyperlipidemia    Hypertension    Morbid obesity ([place])    target weight = 219  for BMI < 30   Nonischemic cardiomyopathy (  [place])    tachycardia mediated   Seasonal allergies    Sleep apnea    original 2001 - wears C-PaP   Type 2 diabetes mellitus ([place])     ROS:   All systems reviewed and negative except as noted in the HPI.   Past Surgical History:  Procedure Laterality Date   ATRIAL FIBRILLATION ABLATION N/A 12/14/2018   Procedure: ATRIAL FIBRILLATION ABLATION;  Surgeon: Thompson Grayer, MD;  Location: [facility];  Service: Cardiovascular;  Laterality: N/A;   ATRIAL FIBRILLATION ABLATION N/A 10/18/2020   Procedure: ATRIAL FIBRILLATION ABLATION;  Surgeon: Thompson Grayer, MD;  Location: [facility];  Service: Cardiovascular;  Laterality: N/A;   atrial flutter ablation  08/06/10   CARDIOVERSION N/A 07/05/2018   Procedure: CARDIOVERSION;  Surgeon: Buford Dresser, MD;  Location: [facility] ENDOSCOPY;  Service: Cardiovascular;   Laterality: N/A;   CARDIOVERSION N/A 08/08/2020   Procedure: CARDIOVERSION;  Surgeon: Larey Dresser, MD;  Location: [facility] ENDOSCOPY;  Service: Cardiovascular;  Laterality: N/A;   CARDIOVERSION N/A 08/29/2020   Procedure: CARDIOVERSION;  Surgeon: Jolaine Artist, MD;  Location: [place];  Service: Cardiovascular;  Laterality: N/A;   COLONOSCOPY     ICD IMPLANT N/A 05/12/2022   Procedure: ICD IMPLANT;  Surgeon: Evans Lance, MD;  Location: [place] [facility];  Service: Cardiovascular;  Laterality: N/A;   POLYPECTOMY     RIGHT HEART CATH N/A 08/06/2020   Procedure: RIGHT HEART CATH;  Surgeon: Larey Dresser, MD;  Location: [place] [facility];  Service: Cardiovascular;  Laterality: N/A;   RIGHT/LEFT HEART CATH AND CORONARY ANGIOGRAPHY N/A 11/01/2021   Procedure: RIGHT/LEFT HEART CATH AND CORONARY ANGIOGRAPHY;  Surgeon: Larey Dresser, MD;  Location: [place] [facility];  Service: Cardiovascular;  Laterality: N/A;   TEE WITHOUT CARDIOVERSION N/A 12/14/2018   Procedure: TRANSESOPHAGEAL ECHOCARDIOGRAM (TEE);  Surgeon: Thompson Grayer, MD;  Location: [place] [facility];  Service: Cardiovascular;  Laterality: N/A;   TEE WITHOUT CARDIOVERSION N/A 08/03/2020   Procedure: TRANSESOPHAGEAL ECHOCARDIOGRAM (TEE);  Surgeon: Sanda Klein, MD;  Location: [place];  Service: Cardiovascular;  Laterality: N/A;   TEE WITHOUT CARDIOVERSION N/A 08/08/2020   Procedure: TRANSESOPHAGEAL ECHOCARDIOGRAM (TEE);  Surgeon: Larey Dresser, MD;  Location: [facility] ENDOSCOPY;  Service: Cardiovascular;  Laterality: N/A;   TEE WITHOUT CARDIOVERSION N/A 10/18/2020   Procedure: TRANSESOPHAGEAL ECHOCARDIOGRAM (TEE);  Surgeon: Jerline Pain, MD;  Location: [facility] ENDOSCOPY;  Service: Cardiovascular;  Laterality: N/A;     Family History  Problem Relation Age of Onset   Hypertension Mother    Kidney failure Mother        Due to sepsis   COPD Father    Colon cancer Brother        dx in 50's   Esophageal cancer Neg Hx     Rectal cancer Neg Hx    Stomach cancer Neg Hx      Social History   Socioeconomic History   Marital status: Single    Spouse name: Not on file   Number of children: 0   Years of education: Not on file   Highest education level: Not on file  Occupational History   Occupation: Sales  Tobacco Use   Smoking status: Never   Smokeless tobacco: Never  Vaping Use   Vaping Use: Never used  Substance and Sexual Activity   Alcohol use: Yes    Alcohol/week: 21.0 standard drinks of alcohol    Types: 21 Cans of beer per week  Comment: previously quite heavy, working on cessation    Drug use: No   Sexual activity: Not on file  Other Topics Concern   Not on file  Social History Narrative   Lives in [place] and works for [name]   Social Determinants of Health   Financial Resource Strain: Low Risk  (06/20/2021)   Overall Financial Resource Strain (CARDIA)    Difficulty of Paying Living Expenses: Not hard at all  Food Insecurity: No Food Insecurity (07/15/2022)   Hunger Vital Sign    Worried About Running Out of Food in the Last Year: Never true    Ran Out of Food in the Last Year: Never true  Transportation Needs: No Transportation Needs (07/15/2022)   PRAPARE - Hydrologist (Medical): No    Lack of Transportation (Non-Medical): No  Physical Activity: Sufficiently Active (06/20/2021)   Exercise Vital Sign    Days of Exercise per Week: 5 days    Minutes of Exercise per Session: 30 min  Stress: No Stress Concern Present (06/20/2021)   [place]    Feeling of Stress : Not at all  Social Connections: Unknown (06/20/2021)   Social Connection and Isolation Panel [NHANES]    Frequency of Communication with Friends and Family: More than three times a week    Frequency of Social Gatherings with Friends and Family: Twice a week    Attends Religious Services: Patient refused    Active Member of  [place] or Organizations: Patient refused    Attends Archivist Meetings: Patient refused    Marital Status: Never married  Intimate Partner Violence: Not At Risk (06/20/2021)   Humiliation, Afraid, Rape, and Kick questionnaire    Fear of Current or Ex-Partner: No    Emotionally Abused: No    Physically Abused: No    Sexually Abused: No     BP 114/78   Pulse 68   Ht '6\' 1"'$  (1.854 m)   Wt 262 lb 3.2 oz (118.9 kg)   SpO2 98%   BMI 34.59 kg/m   Physical Exam:  Well appearing NAD HEENT: Unremarkable Neck:  No JVD, no thyromegally Lymphatics:  No adenopathy Back:  No CVA tenderness Lungs:  Clear with no wheezes HEART:  Regular rate rhythm, no murmurs, no rubs, no clicks Abd:  soft, positive bowel sounds, no organomegally, no rebound, no guarding Ext:  2 plus pulses, no edema, no cyanosis, no clubbing Skin:  No rashes no nodules Neuro:  CN II through XII intact, motor grossly intact  EKG - nsr with IVCD  DEVICE  Normal device function.  See PaceArt for details.   Assess/Plan:  Chronic systolic heart failure - He has class 2 symptoms and is stable on GDMT. His ICD is working normally. 2.  HTN - his bp is controlled on medical therapy. We will follow.   Carleene Overlie Kevyn Wengert,MD

## 2022-08-14 NOTE — Patient Instructions (Signed)
Medication Instructions:  Your physician recommends that you continue on your current medications as directed. Please refer to the Current Medication list given to you today.  *If you need a refill on your cardiac medications before your next appointment, please call your pharmacy*  Lab Work: None ordered.  If you have labs (blood work) drawn today and your tests are completely normal, you will receive your results only by: [place] (if you have MyChart) OR A paper copy in the mail If you have any lab test that is abnormal or we need to change your treatment, we will call you to review the results.  Testing/Procedures: None ordered.  Follow-Up: At [facility], you and your health needs are our priority.  As part of our continuing mission to provide you with exceptional heart care, we have created designated Provider Care Teams.  These Care Teams include your primary Cardiologist (physician) and Advanced Practice Providers (APPs -  Physician Assistants and Nurse Practitioners) who all work together to provide you with the care you need, when you need it.  We recommend signing up for the patient portal called "MyChart".  Sign up information is provided on this After Visit Summary.  MyChart is used to connect with patients for Virtual Visits (Telemedicine).  Patients are able to view lab/test results, encounter notes, upcoming appointments, etc.  Non-urgent messages can be sent to your provider as well.   To learn more about what you can do with MyChart, go to [email or web address].    Your next appointment:   1 year(s)  The format for your next appointment:   In Person  Provider:   Cristopher Peru, MD{or one of the following Advanced Practice Providers on your designated Care Team:   Tommye Standard, [place] Legrand Como "Jonni Sanger" Chalmers Cater, [place]  Remote monitoring is used to monitor your ICD from home. This monitoring reduces the number of office visits required to check your device to one  time per year. It allows [place] to keep an eye on the functioning of your device to ensure it is working properly. You are scheduled for a device check from home on 11/11/2022. You may send your transmission at any time that day. If you have a wireless device, the transmission will be sent automatically. After your physician reviews your transmission, you will receive a postcard with your next transmission date.

## 2022-08-15 ENCOUNTER — Other Ambulatory Visit (HOSPITAL_COMMUNITY): Payer: Self-pay

## 2022-08-15 ENCOUNTER — Other Ambulatory Visit (HOSPITAL_COMMUNITY): Payer: Self-pay | Admitting: Cardiology

## 2022-08-15 MED ORDER — ENTRESTO 97-103 MG PO TABS: 1.0000 | ORAL_TABLET | Freq: Two times a day (BID) | ORAL | 3 refills | Status: DC

## 2022-08-26 ENCOUNTER — Other Ambulatory Visit: Payer: Self-pay

## 2022-08-27 ENCOUNTER — Inpatient Hospital Stay: Payer: Medicare HMO | Attending: Hematology

## 2022-08-27 ENCOUNTER — Inpatient Hospital Stay (HOSPITAL_BASED_OUTPATIENT_CLINIC_OR_DEPARTMENT_OTHER): Payer: Medicare HMO | Admitting: Hematology

## 2022-08-27 ENCOUNTER — Other Ambulatory Visit: Payer: Self-pay

## 2022-08-27 DIAGNOSIS — I10 Essential (primary) hypertension: Secondary | ICD-10-CM | POA: Diagnosis not present

## 2022-08-27 DIAGNOSIS — Z7289 Other problems related to lifestyle: Secondary | ICD-10-CM | POA: Insufficient documentation

## 2022-08-27 DIAGNOSIS — D751 Secondary polycythemia: Secondary | ICD-10-CM | POA: Insufficient documentation

## 2022-08-27 DIAGNOSIS — Z8249 Family history of ischemic heart disease and other diseases of the circulatory system: Secondary | ICD-10-CM | POA: Diagnosis not present

## 2022-08-27 DIAGNOSIS — Z825 Family history of asthma and other chronic lower respiratory diseases: Secondary | ICD-10-CM | POA: Diagnosis not present

## 2022-08-27 DIAGNOSIS — Z841 Family history of disorders of kidney and ureter: Secondary | ICD-10-CM | POA: Insufficient documentation

## 2022-08-27 DIAGNOSIS — E785 Hyperlipidemia, unspecified: Secondary | ICD-10-CM | POA: Insufficient documentation

## 2022-08-27 DIAGNOSIS — Z79899 Other long term (current) drug therapy: Secondary | ICD-10-CM | POA: Insufficient documentation

## 2022-08-27 DIAGNOSIS — Z800 Family history of malignant neoplasm of digestive organs: Secondary | ICD-10-CM | POA: Insufficient documentation

## 2022-08-27 DIAGNOSIS — E119 Type 2 diabetes mellitus without complications: Secondary | ICD-10-CM | POA: Diagnosis not present

## 2022-08-27 LAB — CBC WITH DIFFERENTIAL (CANCER CENTER ONLY)
Abs Immature Granulocytes: 0.02 10*3/uL (ref 0.00–0.07)
Basophils Absolute: 0.1 10*3/uL (ref 0.0–0.1)
Basophils Relative: 1 %
Eosinophils Absolute: 0.3 10*3/uL (ref 0.0–0.5)
Eosinophils Relative: 4 %
HCT: 53.6 % — ABNORMAL HIGH (ref 39.0–52.0)
Hemoglobin: 18.8 g/dL — ABNORMAL HIGH (ref 13.0–17.0)
Immature Granulocytes: 0 %
Lymphocytes Relative: 21 %
Lymphs Abs: 1.3 10*3/uL (ref 0.7–4.0)
MCH: 30.8 pg (ref 26.0–34.0)
MCHC: 35.1 g/dL (ref 30.0–36.0)
MCV: 87.9 fL (ref 80.0–100.0)
Monocytes Absolute: 0.6 10*3/uL (ref 0.1–1.0)
Monocytes Relative: 10 %
Neutro Abs: 3.9 10*3/uL (ref 1.7–7.7)
Neutrophils Relative %: 64 %
Platelet Count: 170 10*3/uL (ref 150–400)
RBC: 6.1 MIL/uL — ABNORMAL HIGH (ref 4.22–5.81)
RDW: 14 % (ref 11.5–15.5)
WBC Count: 6.2 10*3/uL (ref 4.0–10.5)
nRBC: 0 % (ref 0.0–0.2)

## 2022-08-27 LAB — CMP (CANCER CENTER ONLY)
ALT: 16 U/L (ref 0–44)
AST: 24 U/L (ref 15–41)
Albumin: 3.9 g/dL (ref 3.5–5.0)
Alkaline Phosphatase: 61 U/L (ref 38–126)
Anion gap: 5 (ref 5–15)
BUN: 17 mg/dL (ref 8–23)
CO2: 34 mmol/L — ABNORMAL HIGH (ref 22–32)
Calcium: 9.9 mg/dL (ref 8.9–10.3)
Chloride: 98 mmol/L (ref 98–111)
Creatinine: 1.37 mg/dL — ABNORMAL HIGH (ref 0.61–1.24)
GFR, Estimated: 57 mL/min — ABNORMAL LOW (ref >60)
Glucose, Bld: 91 mg/dL (ref 70–99)
Potassium: 3.8 mmol/L (ref 3.5–5.1)
Sodium: 137 mmol/L (ref 135–145)
Total Bilirubin: 1 mg/dL (ref 0.3–1.2)
Total Protein: 7.3 g/dL (ref 6.5–8.1)

## 2022-08-27 LAB — IRON AND IRON BINDING CAPACITY (CC-WL,HP ONLY)
Iron: 97 ug/dL (ref 45–182)
Saturation Ratios: 45 % — ABNORMAL HIGH (ref 17.9–39.5)
TIBC: 217 ug/dL — ABNORMAL LOW (ref 250–450)
UIBC: 120 ug/dL (ref 117–376)

## 2022-08-27 LAB — FERRITIN: Ferritin: 165 ng/mL (ref 24–336)

## 2022-08-27 NOTE — Progress Notes (Signed)
[name]   HEMATOLOGY/ONCOLOGY CLINIC VISIT NOTE  Date of Service: 08/27/2022  Patient Care Team: [name], MD as PCP - General (Internal Medicine) [name] Dresser, MD as PCP - Cardiology (Cardiology) [name] Grayer, MD (Inactive) as PCP - Electrophysiology (Cardiology)  CHIEF COMPLAINTS/PURPOSE OF CONSULTATION:  Follow-up for hereditary hemochromatosis  HISTORY OF PRESENTING ILLNESS:  Please see previous note for details of initial presentation  INTERVAL HISTORY [name] is a 67 y.o. male here for continued evaluation and management of his hereditary hemochromatosis. Patient was last seen by me on 02/25/2022 and was doing well overall with no new medical concerns.   Today, he reports that he has not felt his defibrillator go off recently. He continues to be on Jardiance and reports that he does stay hydrated by drinking about 48 ounces of water daily. He denies any major meication changes.  His alcohol consumption has been stable at 3-4 beers a day. He reports that he has never smoked and that his oxygen level is 97%. He is not donating any blood at this time. In regards to his diet, he typically consumes red meat 1-2 times month. He denies any chest [name] or SOB that is new or different. He denies any back [name], abdominal [name], or leg swelling.  MEDICAL HISTORY:  Past Medical History:  Diagnosis Date   Asthma    as a teenager - does not use an inhaler although pt said he has an albuteral inhaler   Atrial fibrillation ([place])    persistant 02/2009   Atrial flutter ([facility])    s/p CTI ablation 08/06/10   Cataract    Chronic rhinitis    Colon polyp    Congestive heart failure ([place])    Diverticulosis    colonoscopy 04/03/2009   Hyperlipidemia    Hypertension    Morbid obesity ([place])    target weight = 219  for BMI < 30   Nonischemic cardiomyopathy ([place])    tachycardia mediated   Seasonal allergies    Sleep apnea    original 2001 - wears C-PaP   Type 2 diabetes  mellitus ([place])     SURGICAL HISTORY: Past Surgical History:  Procedure Laterality Date   ATRIAL FIBRILLATION ABLATION N/A 12/14/2018   Procedure: ATRIAL FIBRILLATION ABLATION;  Surgeon: [name] Grayer, MD;  Location: [place] [facility];  Service: Cardiovascular;  Laterality: N/A;   ATRIAL FIBRILLATION ABLATION N/A 10/18/2020   Procedure: ATRIAL FIBRILLATION ABLATION;  Surgeon: [name] Grayer, MD;  Location: [place] [facility];  Service: Cardiovascular;  Laterality: N/A;   atrial flutter ablation  08/06/10   CARDIOVERSION N/A 07/05/2018   Procedure: CARDIOVERSION;  Surgeon: [name] Dresser, MD;  Location: [facility] ENDOSCOPY;  Service: Cardiovascular;  Laterality: N/A;   CARDIOVERSION N/A 08/08/2020   Procedure: CARDIOVERSION;  Surgeon: [name] Dresser, MD;  Location: [facility] ENDOSCOPY;  Service: Cardiovascular;  Laterality: N/A;   CARDIOVERSION N/A 08/29/2020   Procedure: CARDIOVERSION;  Surgeon: [name] Artist, MD;  Location: [place];  Service: Cardiovascular;  Laterality: N/A;   COLONOSCOPY     ICD IMPLANT N/A 05/12/2022   Procedure: ICD IMPLANT;  Surgeon: [name] Lance, MD;  Location: [place] [facility];  Service: Cardiovascular;  Laterality: N/A;   POLYPECTOMY     RIGHT HEART CATH N/A 08/06/2020   Procedure: RIGHT HEART CATH;  Surgeon: [name] Dresser, MD;  Location: [place] [facility];  Service: Cardiovascular;  Laterality: N/A;   RIGHT/LEFT HEART CATH AND CORONARY ANGIOGRAPHY N/A 11/01/2021   Procedure:  RIGHT/LEFT HEART CATH AND CORONARY ANGIOGRAPHY;  Surgeon: [name] Dresser, MD;  Location: [place] [facility];  Service: Cardiovascular;  Laterality: N/A;   TEE WITHOUT CARDIOVERSION N/A 12/14/2018   Procedure: TRANSESOPHAGEAL ECHOCARDIOGRAM (TEE);  Surgeon: [name] Grayer, MD;  Location: [place] [facility];  Service: Cardiovascular;  Laterality: N/A;   TEE WITHOUT CARDIOVERSION N/A 08/03/2020   Procedure: TRANSESOPHAGEAL ECHOCARDIOGRAM (TEE);  Surgeon: [name] Klein, MD;  Location: [place];  Service: Cardiovascular;  Laterality: N/A;   TEE WITHOUT CARDIOVERSION N/A 08/08/2020   Procedure: TRANSESOPHAGEAL ECHOCARDIOGRAM (TEE);  Surgeon: [name] Dresser, MD;  Location: [facility] ENDOSCOPY;  Service: Cardiovascular;  Laterality: N/A;   TEE WITHOUT CARDIOVERSION N/A 10/18/2020   Procedure: TRANSESOPHAGEAL ECHOCARDIOGRAM (TEE);  Surgeon: [name] Pain, MD;  Location: [facility] ENDOSCOPY;  Service: Cardiovascular;  Laterality: N/A;    SOCIAL HISTORY: Social History   Socioeconomic History   Marital status: Single    Spouse name: Not on file   Number of children: 0   Years of education: Not on file   Highest education level: Not on file  Occupational History   Occupation: Sales  Tobacco Use   Smoking status: Never   Smokeless tobacco: Never  Vaping Use   Vaping Use: Never used  Substance and Sexual Activity   Alcohol use: Yes    Alcohol/week: 21.0 standard drinks of alcohol    Types: 21 Cans of beer per week    Comment: previously quite heavy, working on cessation    Drug use: No   Sexual activity: Not on file  Other Topics Concern   Not on file  Social History Narrative   Lives in [place] and works for [name]   Social Determinants of Health   Financial Resource Strain: Low Risk  (06/20/2021)   Overall Financial Resource Strain (CARDIA)    Difficulty of Paying Living Expenses: Not hard at all  Food Insecurity: No Food Insecurity (07/15/2022)   Hunger Vital Sign    Worried About Running Out of Food in the Last Year: Never true    Ran Out of Food in the Last Year: Never true  Transportation Needs: No Transportation Needs (07/15/2022)   PRAPARE - Hydrologist (Medical): No    Lack of Transportation (Non-Medical): No  Physical Activity: Sufficiently Active (06/20/2021)   Exercise Vital Sign    Days of Exercise per Week: 5 days    Minutes of Exercise per Session: 30 min  Stress: No Stress Concern Present (06/20/2021)   [place]    Feeling of Stress : Not at all  Social Connections: Unknown (06/20/2021)   Social Connection and Isolation Panel [NHANES]    Frequency of Communication with Friends and Family: More than three times a week    Frequency of Social Gatherings with Friends and Family: Twice a week    Attends Religious Services: Patient refused    Active Member of Clubs or Organizations: Patient refused    Attends Archivist Meetings: Patient refused    Marital Status: Never married  Intimate Partner Violence: Not At Risk (06/20/2021)   Humiliation, Afraid, Rape, and Kick questionnaire    Fear of Current or Ex-Partner: No    Emotionally Abused: No    Physically Abused: No    Sexually Abused: No    FAMILY HISTORY: Family History  Problem Relation Age of Onset   Hypertension Mother    Kidney failure Mother  Due to sepsis   COPD Father    Colon cancer Brother        dx in 60's   Esophageal cancer Neg Hx    Rectal cancer Neg Hx    Stomach cancer Neg Hx     ALLERGIES:  has No Known Allergies.  MEDICATIONS:  Current Outpatient Medications  Medication Sig Dispense Refill   BD PEN NEEDLE NANO 2ND GEN 32G X 4 MM MISC USE 4 TIMES A DAY AS DIRECTED 100 each 4   carvedilol (COREG) 12.5 MG tablet Take 1.5 tablets (18.75 mg total) by mouth 2 (two) times daily with a meal. 270 tablet 3   cholecalciferol (VITAMIN D3) 25 MCG (1000 UNIT) tablet Take 1,000 Units by mouth at bedtime.     Continuous Blood Gluc Receiver (FREESTYLE LIBRE 14 DAY READER) DEVI USE AS DIRECTED 1 each 11   doxylamine, Sleep, (UNISOM) 25 MG tablet Take 50 mg by mouth at bedtime.     empagliflozin (JARDIANCE) 10 MG TABS tablet Take 1 tablet (10 mg total) by mouth daily. 30 tablet 5   furosemide (LASIX) 20 MG tablet Take 1 tablet (20 mg total) by mouth daily. 90 tablet 2   insulin aspart (NOVOLOG FLEXPEN) 100 UNIT/ML FlexPen INJECT 8-12 UNITS INTO THE SKIN 3 (THREE)  TIMES DAILY WITH MEALS. 15 mL 3   insulin degludec (TRESIBA FLEXTOUCH) 200 UNIT/ML FlexTouch Pen INJECT 66 UNITS INTO THE SKIN DAILY. 9 mL 3   levothyroxine (SYNTHROID) 50 MCG tablet TAKE 1 TABLET BY MOUTH EVERY DAY BEFORE BREAKFAST 90 tablet 2   mometasone (NASONEX) 50 MCG/ACT nasal spray Place 2 sprays into the nose daily as needed (allergies or rhinitis). 1 each 11   Multiple Vitamins-Minerals (MULTIVITAMIN WITH MINERALS) tablet Take 2 tablets by mouth at bedtime. Chewable     OZEMPIC, 0.25 OR 0.5 MG/DOSE, 2 MG/3ML SOPN INJECT 0.5 MG INTO THE SKIN ONCE A WEEK. 9 mL 1   rosuvastatin (CRESTOR) 10 MG tablet Take 1 tablet (10 mg total) by mouth daily. 90 tablet 3   sacubitril-valsartan (ENTRESTO) 97-103 MG Take 1 tablet by mouth 2 (two) times daily. 180 tablet 3   SYMBICORT 80-4.5 MCG/ACT inhaler INHALE 2 PUFFS INTO THE LUNGS DAILY AS NEEDED (SHORTNESS OF BREATH). 30.6 each 1   vitamin C (ASCORBIC ACID) 500 MG tablet Take 500 mg by mouth in the morning.     warfarin (COUMADIN) 5 MG tablet TAKE 1 TABLET TO 1 AND 1/2 TABLETS BY MOUTH DAILY AS DIRECTED BY THE COUMADIN CLINIC. 135 tablet 1   No current facility-administered medications for this visit.    REVIEW OF SYSTEMS:    10 Point review of Systems was done is negative except as noted above.   PHYSICAL EXAMINATION: .BP 100/80   Pulse 66   Temp 98.1 F (36.7 C)   Resp 20   Wt 257 lb 6.4 oz (116.8 kg)   SpO2 99%   BMI 33.96 kg/m  GENERAL:alert, in no acute distress and comfortable SKIN: no acute rashes, no significant lesions EYES: conjunctiva are pink and non-injected, sclera anicteric OROPHARYNX: MMM, no exudates, no oropharyngeal erythema or ulceration NECK: supple, no JVD LYMPH:  no palpable lymphadenopathy in the cervical, axillary or inguinal regions LUNGS: clear to auscultation b/l with normal respiratory effort HEART: regular rate & rhythm ABDOMEN:  normoactive bowel sounds , non tender, not distended. Extremity: no pedal  edema PSYCH: alert & oriented x 3 with fluent speech NEURO: no focal motor/sensory deficits   LABORATORY  DATA:    Latest Ref Rng & Units 08/27/2022    1:06 PM 04/17/2022    1:03 PM 02/25/2022    2:20 PM  CBC  WBC 4.0 - 10.5 K/uL 6.2  6.5  6.8   Hemoglobin 13.0 - 17.0 g/dL 18.8  17.8  16.5   Hematocrit 39.0 - 52.0 % 53.6  52.7  47.7   Platelets 150 - 400 K/uL 170  191  199     .    Latest Ref Rng & Units 08/27/2022    1:06 PM 05/29/2022    3:15 PM 04/17/2022    1:03 PM  CMP  Glucose 70 - 99 mg/dL 91  94  100   BUN 8 - 23 mg/dL 17  15  16    Creatinine 0.61 - 1.24 mg/dL 1.37  1.38  1.36   Sodium 135 - 145 mmol/L 137  134  140   Potassium 3.5 - 5.1 mmol/L 3.8  3.7  4.3   Chloride 98 - 111 mmol/L 98  97  100   CO2 22 - 32 mmol/L 34  27  27   Calcium 8.9 - 10.3 mg/dL 9.9  9.4  9.5   Total Protein 6.5 - 8.1 g/dL 7.3     Total Bilirubin 0.3 - 1.2 mg/dL 1.0     Alkaline Phos 38 - 126 U/L 61     AST 15 - 41 U/L 24     ALT 0 - 44 U/L 16      . Lab Results  Component Value Date   IRON 97 08/27/2022   TIBC 217 (L) 08/27/2022   IRONPCTSAT 45 (H) 08/27/2022   (Iron and TIBC)  Lab Results  Component Value Date   FERRITIN 165 08/27/2022      RADIOGRAPHIC STUDIES: I have personally reviewed the radiological images as listed and agreed with the findings in the report. CUP PACEART REMOTE DEVICE CHECK  Result Date: 08/12/2022 Scheduled remote reviewed. Normal device function.  Hx of PAF, controlled rates, burden 0.2%, Warfarin Next remote 91 days. [place]   ASSESSMENT & PLAN:   67 year old with multiple medical comorbidities including hypertension, dyslipidemia, diabetes, nonischemic cardiomyopathy, sleep apnea using CPAP, persistent atrial fibrillation on anticoagulation with  #1 compound heterozygous state for hereditary hemochromatosis. c.845G>A (p.Cys282Tyr) - Detected, heterozygous  c.187C>G (p.His63Asp) - Detected, heterozygous  c.193A>T (p.Ser65Cys) - Not Detected  His  previous elevated ferritin levels appear to not reflect a primary iron overload state but were likely due to his transaminitis from heavy alcohol use.  His ferritin levels have come down from 1100 to 89 over 2 years without any interventions bleeding blood donations or phlebotomies in tandem with improvement in his liver function tests.  PLAN:  -Discussed lab results on 08/27/2022 with patient. CBC showed WBC of 6.2K, hemoglobin of 18.8, hematocrit of 53.6, and platelets of 170K. -hematocrit levels continue to increase. Likely not a bone marrow issue, but rather a combinatoin of secondary factors such as Jardiance, COPD, and alcohol use. -Informed patient that alcohol consumption may be associated with dehydration and cardiovascular issues. Recommend patient to avoid alcohol consumption.  -Advised patient to follow-up with PCP to consider discontinuing Jardiance -informed patient that cardiovascular issues may cause toleration issues with treatment -Last ferritin level was in 300s.  -informed patient that typically, with increased iron absorption or hemoglobin levels, more therapeutic  phlebotomies would generally be necessary. However, if risks outweigh benefits, phlebotomies would not be recommend. Less phlebotomies may be necessary to cause less  fluid shifts and improve toleration. -informed patient that sensibility to low oxygen drops with alcohol consumption -iron labs pending, will reveal whether therapeutic phlebotomies may be necessary -in the context of chronic kidney disease, higher iron levels are goal -If ferratin >300% and iron saturation above 60%, will recommend phlebotomy -Patient has not yet made a decision regarding whether or not he would like to proceed with phlebotomies if his ferritin level is above 300. -If patient would not like to proceed with therapeutic phlebotomies, PCP shall continue to monitor with labs -informed patient that for every bottle of beer, 4 volumes of water  would be recommended to balance hydration -informed patient that jardiance may cause dehydration  #2  Secondary polycythemia -This has developed since February 2022. Jak2 V617F mutation and Jak2 Exon 12 mutation testing is negative which makes polycythemia vera less than 1% likely. His current hematocrit is 60 with a hemoglobin level of 20.3 The timeline of his presentation is in keeping with the initiation of Jardiance which can cause osmotic diuresis and secondary polycythemia from hemoconcentration. Patient is also on diuretic therapy, has congestive heart failure due to nonischemic cardiomyopathy, has sleep apnea and potentially COPD. Plan No indication for therapeutic phlebotomy for his secondary polycythemia at this time Follow-up with primary care physician for management of secondary factors causing polycythemia including the use of Jardiance, diuretics. -Follow-up with PCP for management of sleep apnea and consideration of lung function testing for COPD.  FOLLOW-UP: RTC with Dr Irene Limbo with labs in 12 months F/u with PCP in 4-6 months  The total time spent in the appointment was 25 minutes* .  All of the patient's questions were answered with apparent satisfaction. The patient knows to call the clinic with any problems, questions or concerns.   Sullivan Lone MD MS AAHIVMS [facility] Hematology/Oncology Physician [facility]  .*Total Encounter Time as defined by the Centers for Medicare and Medicaid Services includes, in addition to the face-to-face time of a patient visit (documented in the note above) non-face-to-face time: obtaining and reviewing outside history, ordering and reviewing medications, tests or procedures, care coordination (communications with other health care professionals or caregivers) and documentation in the medical record.    I,Mitra Faeizi,acting as a Education administrator for Sullivan Lone, MD.,have documented all relevant documentation on the behalf of Sullivan Lone, MD,as directed by  Sullivan Lone, MD while in the presence of Sullivan Lone, MD.  .I have reviewed the above documentation for accuracy and completeness, and I agree with the above. Brunetta Genera MD

## 2022-08-29 ENCOUNTER — Ambulatory Visit: Payer: Medicare HMO | Attending: Cardiovascular Disease | Admitting: *Deleted

## 2022-08-29 DIAGNOSIS — I4891 Unspecified atrial fibrillation: Secondary | ICD-10-CM | POA: Diagnosis not present

## 2022-08-29 DIAGNOSIS — I513 Intracardiac thrombosis, not elsewhere classified: Secondary | ICD-10-CM

## 2022-08-29 LAB — POCT INR: POC INR: 1.9

## 2022-08-29 NOTE — Patient Instructions (Signed)
Description   Take 1.5 tablets of warfarin today and then START taking 1.5 tablets daily except for 1 tablet on Monday, Wednesday and Friday. Recheck INR in 2 weeks. [facility]in Clinic [phone].

## 2022-09-08 DIAGNOSIS — E1169 Type 2 diabetes mellitus with other specified complication: Secondary | ICD-10-CM | POA: Diagnosis not present

## 2022-09-12 ENCOUNTER — Ambulatory Visit: Payer: Medicare HMO | Attending: Cardiovascular Disease | Admitting: *Deleted

## 2022-09-12 DIAGNOSIS — I4891 Unspecified atrial fibrillation: Secondary | ICD-10-CM | POA: Diagnosis not present

## 2022-09-12 DIAGNOSIS — I513 Intracardiac thrombosis, not elsewhere classified: Secondary | ICD-10-CM | POA: Diagnosis not present

## 2022-09-12 LAB — POCT INR: POC INR: 2.3

## 2022-09-12 NOTE — Patient Instructions (Signed)
Description    START taking 1.5 tablets daily except for 1 tablet on Monday and Wednesday. Recheck INR in 3 weeks. Coumadin Clinic [phone].

## 2022-09-15 ENCOUNTER — Telehealth: Payer: Self-pay

## 2022-09-15 ENCOUNTER — Telehealth: Payer: Self-pay | Admitting: Hematology

## 2022-09-15 NOTE — Telephone Encounter (Signed)
Called patient per 3/28 IB message to schedule f/u. Left voicemail with new appointment information and contact details if needing to reschedule.

## 2022-09-15 NOTE — Telephone Encounter (Signed)
Attempted to contact pt per Dr Irene Limbo to :let patient know his ferritin levels are down to 165-- on indication for therapeutic phlebotomy for this currently. He needs to f/u with PCP to address secondary cause of his polycythemia and we need to setup f/u with [place] in 1 yr with labs . Left pt a message to return my call to discuss above information.

## 2022-09-22 NOTE — Progress Notes (Signed)
Remote ICD transmission.   

## 2022-09-28 ENCOUNTER — Other Ambulatory Visit: Payer: Self-pay | Admitting: Internal Medicine

## 2022-09-29 NOTE — Progress Notes (Signed)
PCP: Myrlene Broker, MD Primary Cardiologist: Dr. Cristal Deer HF Cardiologist: Dr. Shirlee Latch  EP: Dr. Johney Frame   HPI: 67 y.o. male w/ h/o cardiomyopathy, presumed to be tachy-mediated from atrial fibrillation/atrial flutter.  Paroxsymal Afib/flutter dates back to ~2012. Had Aflutter ablation in 2012. He had several echos between 2012-2018 showing normal LVEF. Found to have reduced EF on echo in 04/2018, down to 30-35%. Was in afib at the time. 11/2018 was referred for Afib ablation but procedure was aborted after TEE showed LA thrombus, despite being on Xarelto. Anticoagulation changed to Pradaxa. He ultimately spontaneously converted back to NSR. Repeat echo 10/20 showed EF 35-40%.  Plans were made for [facility] 10/20 however had asymptomatic positive COVID test, then renal failure. Ischemic eval changed to [employer] which showed no ischemia in 10/20. Echo repeated 3/21, EF 40-45%, RV normal.    Seen in cardiology clinic, back in atrial fibrillation with RVR.  Presented for outpatient DCCV 08/03/20. TEE showed LA thrombus. DCCV canceled. EF lower on TEE ~5-10% with RV severely reduced. Admitted and started on IV Lasix and PICC line was placed. Milrinone started for low output, initial co-ox 35%. Pradaxa stopped and he was placed on coumadin w/ heparin bridge. Placed on IV amio for afib. He had repeat TEE on 08/07/20 that showed no LAA thrombus so DCCV was done successfully. Transitioned to PO amio 200 mg bid. Remained in NSR. Diuresed well w/ IV Lasix and able to wean of milrinone. Discharge weight 264 pounds.   He went back into atrial fibrillation in 3/22, had another DCCV back to NSR.  He had a TEE in 5/22 showing EF 30-35%, then had atrial fibrillation ablation.    Echo in 7/22 showed showed EF 40-45% with normal RV and moderate MR.   Patient has polycythemia and was noted to be a compound heterozygote for hereditary hemochromatosis.  Cardiac MRI was done in 11/22 showing EF 27%, moderate  asymmetric septal hypertrophy, diffuse hypokinesis worse in the septal wall, RV EF 30%, noncoronary LGE with diffuse mid-wall involvement especially in the septum, T2* sequences not suggestive of cardiac hemochromatosis, ECV 50% in the septum.  Echo in 11/22 showed EF 30-35%, severe asymmetric septal hypertrophy, no LVOT gradient or mitral valve SAM, mild RV dilation with mildly decreased RV systolic function.    Patient saw hematology, will not get therapeutic phlebotomy as long as ferritin < 200 and transferrin saturation < 75%.   PYP scan in 11/22 was not suggestive of TTR cardiac amyloidosis.  Genetic testing showed that patient was a heterozygote for MYH7 gene mutation that has been linked to hypertrophic cardiomyopathy. He is followed by Dr. Jomarie Longs.   [facility] 5/23 showed mild nonobstructive CAD w/ 25% mLCx and 25% pRCA lesions, normal filling pressures and low but not markedly low cardiac output, CI 2.16.   Biotronik ICD placed.  Echo 12/23 showed EF 50% with severe basal to mid asymmetric LVH, no LVOT gradient or MV SAM, mild RVE with normal systolic function, mild-moderate MR.   Today he returns for HF follow up. Overall feeling fine. He play golf without problem. No dyspnea with activity. Denies palpitations, abnormal bleeding, CP, dizziness, edema, or PND/Orthopnea. Appetite ok. No fever or chills. Weight at home 255 pounds. Taking all medications, off spiro for unclear reasons, he thinks he ran out of refills. Drinks 3-4 beers/day, wears CPAP.  Labs (5/22): K 4.1, creatinine 1.49 Labs (9/22): hgb 20.3/HCT 60.1, K 4.9, creatinine 1.46, ferritin 116 Labs (11/22): Negative myeloma panel.  K  4.6, creatinine 1.27, LFTs normal, TSH normal.  Labs (3/23): K 3.5, creatinine 1.4, hgb 18 Labs (6/23): K 4.2, creatinine 1.45, Hgb 18.0, Direct LDL 183 mg/dL Labs (1/61): K 4.7, creatinine 1.45, Hgb 16.5  Labs (11/23): K 4.3, creatinine 1.36, Hgb 17.8 Labs (3/24): K 3.8, creatinine 1.37  ECG  (personally reviewed from 08/14/22): NSR  PMH: 1. Atrial flutter: Ablation 2012.  2. Atrial fibrillation: H/o LAA thrombus on both Xarelto and Pradaxa.  DCCV 2/22, 3/22. Atrial fibrillation ablation in 5/22.  3. Chronic systolic CHF: ?tachycardia-mediated CMP with AF/RVR.  - Cardiolite (10/20): No ischemia.  - TEE (2/22): EF 5-10% with severe RV dysfunction.  - Echo (7/22): EF 40-45%, normal RV, moderate MR.  - Cardiac MRI (11/22): EF 27%, moderate asymmetric septal hypertrophy, diffuse hypokinesis worse in the septal wall, RV EF 30%, noncoronary LGE with diffuse mid-wall involvement especially in the septum, T2* sequences not suggestive of cardiac hemochromatosis, ECV 50% in the septum.  - Echo (11/22): EF 30-35%, severe asymmetric septal hypertrophy, no LVOT gradient or mitral valve SAM, mild RV dilation with mildly decreased RV systolic function.   - PYP scan (11/22): grade 1, H/CL 0.95 (negative) - RHC/LHC (5/23): Mild nonobstructive CAD; mean RA 1, PA 20/12, PCWP 8, CI 2.16 - Genetic testing showed that patient was a heterozygote for MYH7 gene mutation that has been linked to hypertrophic cardiomyopathy.  - Echo (12/23): EF 50% with severe basal to mid asymmetric LVH, no LVOT gradient or MV SAM, mild RVE with normal systolic function, mild-moderate MR.  4. CKD stage 3 5. OSA: Uses CPAP 6. Type 2 diabetes 7. Hereditary hemochromatosis: Compound heterozygote with Cys282Tyr and His63Asp.   ROS: All systems negative except as listed in HPI, PMH and Problem List.  Social History   Socioeconomic History   Marital status: Single    Spouse name: Not on file   Number of children: 0   Years of education: Not on file   Highest education level: Not on file  Occupational History   Occupation: Sales  Tobacco Use   Smoking status: Never   Smokeless tobacco: Never  Vaping Use   Vaping Use: Never used  Substance and Sexual Activity   Alcohol use: Yes    Alcohol/week: 21.0 standard drinks of  alcohol    Types: 21 Cans of beer per week    Comment: previously quite heavy, working on cessation    Drug use: No   Sexual activity: Not on file  Other Topics Concern   Not on file  Social History Narrative   Lives in [place] and works for [employer]   Social Determinants of Health   Financial Resource Strain: Low Risk  (06/20/2021)   Overall Financial Resource Strain (CARDIA)    Difficulty of Paying Living Expenses: Not hard at all  Food Insecurity: No Food Insecurity (07/15/2022)   Hunger Vital Sign    Worried About Running Out of Food in the Last Year: Never true    Ran Out of Food in the Last Year: Never true  Transportation Needs: No Transportation Needs (07/15/2022)   PRAPARE - Administrator, Civil Service (Medical): No    Lack of Transportation (Non-Medical): No  Physical Activity: Sufficiently Active (06/20/2021)   Exercise Vital Sign    Days of Exercise per Week: 5 days    Minutes of Exercise per Session: 30 min  Stress: No Stress Concern Present (06/20/2021)   [employer] of Occupational Health - Occupational Stress Questionnaire  Feeling of Stress : Not at all  Social Connections: Unknown (06/20/2021)   Social Connection and Isolation Panel [NHANES]    Frequency of Communication with Friends and Family: More than three times a week    Frequency of Social Gatherings with Friends and Family: Twice a week    Attends Religious Services: Patient declined    Database administrator or Organizations: Patient declined    Attends Banker Meetings: Patient declined    Marital Status: Never married  Intimate Partner Violence: Not At Risk (06/20/2021)   Humiliation, Afraid, Rape, and Kick questionnaire    Fear of Current or Ex-Partner: No    Emotionally Abused: No    Physically Abused: No    Sexually Abused: No    FH:  Family History  Problem Relation Age of Onset   Hypertension Mother    Kidney failure Mother        Due to sepsis   COPD  Father    Colon cancer Brother        dx in 42's   Esophageal cancer Neg Hx    Rectal cancer Neg Hx    Stomach cancer Neg Hx     Past Medical History:  Diagnosis Date   Asthma    as a teenager - does not use an inhaler although pt said he has an albuteral inhaler   Atrial fibrillation    persistant 02/2009   Atrial flutter    s/p CTI ablation 08/06/10   Cataract    Chronic rhinitis    Colon polyp    Congestive heart failure    Diverticulosis    colonoscopy 04/03/2009   Hyperlipidemia    Hypertension    Morbid obesity    target weight = 219  for BMI < 30   Nonischemic cardiomyopathy    tachycardia mediated   Seasonal allergies    Sleep apnea    original 2001 - wears C-PaP   Type 2 diabetes mellitus    Current Outpatient Medications  Medication Sig Dispense Refill   BD PEN NEEDLE NANO 2ND GEN 32G X 4 MM MISC USE 4 TIMES A DAY AS DIRECTED 100 each 4   carvedilol (COREG) 12.5 MG tablet Take 1.5 tablets (18.75 mg total) by mouth 2 (two) times daily with a meal. 270 tablet 3   cholecalciferol (VITAMIN D3) 25 MCG (1000 UNIT) tablet Take 1,000 Units by mouth at bedtime.     Continuous Blood Gluc Receiver (FREESTYLE LIBRE 14 DAY READER) DEVI USE AS DIRECTED 1 each 11   doxylamine, Sleep, (UNISOM) 25 MG tablet Take 50 mg by mouth at bedtime.     empagliflozin (JARDIANCE) 10 MG TABS tablet Take 1 tablet (10 mg total) by mouth daily. 30 tablet 5   furosemide (LASIX) 20 MG tablet Take 1 tablet (20 mg total) by mouth daily. 90 tablet 2   insulin aspart (NOVOLOG FLEXPEN) 100 UNIT/ML FlexPen INJECT 8-12 UNITS INTO THE SKIN 3 (THREE) TIMES DAILY WITH MEALS. 15 mL 3   insulin degludec (TRESIBA FLEXTOUCH) 200 UNIT/ML FlexTouch Pen INJECT 66 UNITS INTO THE SKIN DAILY. (Patient taking differently: 28 Units daily.) 9 mL 3   levothyroxine (SYNTHROID) 50 MCG tablet TAKE 1 TABLET BY MOUTH EVERY DAY BEFORE BREAKFAST 90 tablet 2   mometasone (NASONEX) 50 MCG/ACT nasal spray Place 2 sprays into the  nose daily as needed (allergies or rhinitis). 1 each 11   Multiple Vitamins-Minerals (MULTIVITAMIN WITH MINERALS) tablet Take 2 tablets by mouth at bedtime.  Chewable     OZEMPIC, 0.25 OR 0.5 MG/DOSE, 2 MG/3ML SOPN INJECT 0.5 MG INTO THE SKIN ONCE A WEEK. 9 mL 1   rosuvastatin (CRESTOR) 10 MG tablet Take 1 tablet (10 mg total) by mouth daily. 90 tablet 3   sacubitril-valsartan (ENTRESTO) 97-103 MG Take 1 tablet by mouth 2 (two) times daily. 180 tablet 3   SYMBICORT 80-4.5 MCG/ACT inhaler INHALE 2 PUFFS INTO THE LUNGS DAILY AS NEEDED (SHORTNESS OF BREATH). 30.6 each 1   vitamin C (ASCORBIC ACID) 500 MG tablet Take 500 mg by mouth in the morning.     warfarin (COUMADIN) 5 MG tablet TAKE 1 TABLET TO 1 AND 1/2 TABLETS BY MOUTH DAILY AS DIRECTED BY THE COUMADIN CLINIC. 135 tablet 1   No current facility-administered medications for this encounter.   BP 104/80   Pulse 67   Wt 118.5 kg (261 lb 3.2 oz)   SpO2 95%   BMI 34.46 kg/m   Wt Readings from Last 3 Encounters:  09/30/22 118.5 kg (261 lb 3.2 oz)  08/27/22 116.8 kg (257 lb 6.4 oz)  08/14/22 118.9 kg (262 lb 3.2 oz)   PHYSICAL EXAM: General:  NAD. No resp difficulty, walked into clinic HEENT: Normal Neck: Supple. No JVD. Carotids 2+ bilat; no bruits. No lymphadenopathy or thryomegaly appreciated. Cor: PMI nondisplaced. Regular rate & rhythm. No rubs, gallops or murmurs. Lungs: Clear Abdomen: Soft, nontender, nondistended. No hepatosplenomegaly. No bruits or masses. Good bowel sounds. Extremities: No cyanosis, clubbing, rash, edema Neuro: Alert & oriented x 3, cranial nerves grossly intact. Moves all 4 extremities w/o difficulty. Affect pleasant.  ASSESSMENT & PLAN: 1. Chronic systolic CHF: Biotronik ICD.  TEE 2/22 with EF <10%, severe RV dysfunction, LA appendage thrombus. Echoes in the past have had EF ranging 30-45%. Cardiolite in 10/20 showed no ischemia. Initially, thought to have tachycardia-mediated cardiomyopathy as he seems to  become markedly more symptomatic when in atrial fibrillation.  Alternatively, he could have another cause for cardiomyopathy and atrial fibrillation just causes him to decompensate.  Echo in 7/22 in NSR showed EF 40-45% with normal RV, moderate MR (in NSR after atrial fibrillation ablation). He was found to be a compound heterozygote for hereditary hemochromatosis (evaluation done because of polycythemia), so I was concerned that he could have a hemochromatosis-related CMP.  However, cardiac MRI was done in 11/22, showing EF 27%, moderate asymmetric septal hypertrophy, diffuse hypokinesis worse in the septal wall, RV EF 30%, noncoronary LGE with diffuse mid-wall involvement especially in the septum, T2* sequences not suggestive of cardiac hemochromatosis, ECV 50% in the septum.  Cardiomyopathy appeared most consistent with hypertrophic cardiomyopathy (nonobstructive), less likely amyloidosis or sarcoidosis.  Myeloma panel negative and PYP scan not suggestive of TTR cardiac amyloidosis.  Genetic testing showed that patient was a heterozygote for MYH7 gene mutation that has been linked to hypertrophic cardiomyopathy => I suspect that HCM is the diagnosis here.  [facility] 5/23 showed mild nonobstructive CAD, normal filling pressures and low but not markedly low cardiac output, CI 2.16.  Echo 12/23 showed improved EF 50% with severe basal to mid asymmetric LVH, no LVOT gradient or MV SAM, mild RVE with normal systolic function, mild-moderate MR.  NYHA class I-II, not volume overloaded on exam.  - Restart spironolactone 25 mg daily. BMET today, repeat in 1 week. - Continue Lasix 20 mg daily. - Continue Coreg 18.75 mg bid. - Continue Entresto 97/103 bid.  - Continue Jardiance 10 mg daily.  - We have discussed + MYH7  gene test. He has no children but has 1 living brother. Dr. Jomarie Longs has recommended he undergo genetic testing as well.  2. Atrial fibrillation: Paroxysmal. With LAA clots despite Xarelto and Pradaxa use, he  was started on warfarin.  Will aim for INR 2.5-3.  AF ablation in 5/22. He is off amiodarone post-ablation. Regular on exam today. Denies symptoms of breakthrough Afib. - Continue warfarin. Check INR today and forward to Coumadin Clinic 3. CKD stage 3: BMET today.  4. OSA: Reports full compliance w/ CPAP.  - Continue CPAP nightly.  5. Type 2 diabetes: followed by PCP  - He is on insulin. 6. Hereditary hemochromatosis: Compound heterozygote.  T2* sequences on cardiac MRI were not suggestive of cardiac hemochromatosis.  - Therapeutic phlebotomy only if ferritin > 200 or transferrin saturation > 75%.  7. CAD: mild nonobstructive CAD on cath 5/23, 25% mLCx and 25% pRCA lesions - On Crestor, check lipids today.  - No ASA w/ warfarin  - Continue ? blocker.  Follow up in 4 months with Dr. Park Liter,  FNP-BC 09/30/2022

## 2022-09-30 ENCOUNTER — Ambulatory Visit (INDEPENDENT_AMBULATORY_CARE_PROVIDER_SITE_OTHER): Payer: Medicare HMO

## 2022-09-30 ENCOUNTER — Encounter (HOSPITAL_COMMUNITY): Payer: Self-pay

## 2022-09-30 ENCOUNTER — Ambulatory Visit (HOSPITAL_COMMUNITY)
Admission: RE | Admit: 2022-09-30 | Discharge: 2022-09-30 | Disposition: A | Discharge status: Home / Self Care | Payer: Medicare HMO | Source: Ambulatory Visit | Attending: Family Medicine | Admitting: Family Medicine

## 2022-09-30 VITALS — BP 104/80 | HR 67 | Wt 261.2 lb

## 2022-09-30 DIAGNOSIS — E785 Hyperlipidemia, unspecified: Secondary | ICD-10-CM | POA: Diagnosis not present

## 2022-09-30 DIAGNOSIS — I480 Paroxysmal atrial fibrillation: Secondary | ICD-10-CM | POA: Insufficient documentation

## 2022-09-30 DIAGNOSIS — Z794 Long term (current) use of insulin: Secondary | ICD-10-CM | POA: Diagnosis not present

## 2022-09-30 DIAGNOSIS — I513 Intracardiac thrombosis, not elsewhere classified: Secondary | ICD-10-CM

## 2022-09-30 DIAGNOSIS — I4892 Unspecified atrial flutter: Secondary | ICD-10-CM | POA: Insufficient documentation

## 2022-09-30 DIAGNOSIS — E1169 Type 2 diabetes mellitus with other specified complication: Secondary | ICD-10-CM | POA: Diagnosis not present

## 2022-09-30 DIAGNOSIS — Z7901 Long term (current) use of anticoagulants: Secondary | ICD-10-CM | POA: Insufficient documentation

## 2022-09-30 DIAGNOSIS — N183 Chronic kidney disease, stage 3 unspecified: Secondary | ICD-10-CM | POA: Diagnosis not present

## 2022-09-30 DIAGNOSIS — E1122 Type 2 diabetes mellitus with diabetic chronic kidney disease: Secondary | ICD-10-CM | POA: Diagnosis not present

## 2022-09-30 DIAGNOSIS — I130 Hypertensive heart and chronic kidney disease with heart failure and stage 1 through stage 4 chronic kidney disease, or unspecified chronic kidney disease: Secondary | ICD-10-CM | POA: Insufficient documentation

## 2022-09-30 DIAGNOSIS — D751 Secondary polycythemia: Secondary | ICD-10-CM | POA: Insufficient documentation

## 2022-09-30 DIAGNOSIS — I422 Other hypertrophic cardiomyopathy: Secondary | ICD-10-CM | POA: Diagnosis not present

## 2022-09-30 DIAGNOSIS — I2510 Atherosclerotic heart disease of native coronary artery without angina pectoris: Secondary | ICD-10-CM

## 2022-09-30 DIAGNOSIS — I4891 Unspecified atrial fibrillation: Secondary | ICD-10-CM | POA: Diagnosis not present

## 2022-09-30 DIAGNOSIS — Z7984 Long term (current) use of oral hypoglycemic drugs: Secondary | ICD-10-CM | POA: Diagnosis not present

## 2022-09-30 DIAGNOSIS — Z79899 Other long term (current) drug therapy: Secondary | ICD-10-CM | POA: Diagnosis not present

## 2022-09-30 DIAGNOSIS — G4733 Obstructive sleep apnea (adult) (pediatric): Secondary | ICD-10-CM | POA: Diagnosis not present

## 2022-09-30 DIAGNOSIS — E1136 Type 2 diabetes mellitus with diabetic cataract: Secondary | ICD-10-CM | POA: Diagnosis not present

## 2022-09-30 DIAGNOSIS — I5022 Chronic systolic (congestive) heart failure: Secondary | ICD-10-CM | POA: Diagnosis not present

## 2022-09-30 LAB — BASIC METABOLIC PANEL
Anion gap: 11 (ref 5–15)
BUN: 13 mg/dL (ref 8–23)
CO2: 28 mmol/L (ref 22–32)
Calcium: 9.8 mg/dL (ref 8.9–10.3)
Chloride: 97 mmol/L — ABNORMAL LOW (ref 98–111)
Creatinine, Ser: 1.21 mg/dL (ref 0.61–1.24)
GFR, Estimated: >60 mL/min (ref >60)
Glucose, Bld: 103 mg/dL — ABNORMAL HIGH (ref 70–99)
Potassium: 3.9 mmol/L (ref 3.5–5.1)
Sodium: 136 mmol/L (ref 135–145)

## 2022-09-30 LAB — LIPID PANEL
Cholesterol: 142 mg/dL (ref 0–200)
HDL: 66 mg/dL (ref >40)
LDL Cholesterol: 62 mg/dL (ref 0–99)
Total CHOL/HDL Ratio: 2.2 RATIO
Triglycerides: 71 mg/dL (ref <150)
VLDL: 14 mg/dL (ref 0–40)

## 2022-09-30 LAB — PROTIME-INR
INR: 3.1 — ABNORMAL HIGH (ref 0.8–1.2)
Prothrombin Time: 31.7 seconds — ABNORMAL HIGH (ref 11.4–15.2)

## 2022-09-30 MED ORDER — SPIRONOLACTONE 25 MG PO TABS: 25.0000 mg | ORAL_TABLET | Freq: Every day | ORAL | 3 refills | Status: DC

## 2022-09-30 NOTE — Patient Instructions (Addendum)
    Description    INR 3.1 at HF Clinic, results in Epic.  Spoke with pt, continue on same dosage of Warfarin 1.5 tablets daily except for 1 tablet on Mondays and Wednesdays. Recheck INR in 3 weeks. Coumadin Clinic [phone].

## 2022-09-30 NOTE — Patient Instructions (Addendum)
RESTART Spironolactone 25 mg one tab daily  Labs today We will only contact you if something comes back abnormal or we need to make some changes. Otherwise no news is good news!  Labs needed in 7-10 days  Your physician recommends that you schedule a follow-up appointment in: 3-4 months with Dr Shirlee Latch -please call to schedule  Do the following things EVERYDAY: Weigh yourself in the morning before breakfast. Write it down and keep it in a log. Take your medicines as prescribed Eat low salt foods--Limit salt (sodium) to 2000 mg per day.  Stay as active as you can everyday Limit all fluids for the day to less than 2 liters  At the [facility], you and your health needs are our priority. As part of our continuing mission to provide you with exceptional heart care, we have created designated Provider Care Teams. These Care Teams include your primary Cardiologist (physician) and Advanced Practice Providers (APPs- Physician Assistants and Nurse Practitioners) who all work together to provide you with the care you need, when you need it.   You may see any of the following providers on your designated Care Team at your next follow up: Dr Arvilla Meres Dr Marca Ancona Dr. Marcos Eke, NP Robbie Lis, [place] [facility] [place], [place] Brynda Peon, NP Karle Plumber, PharmD   Please be sure to bring in all your medications bottles to every appointment.    Thank you for choosing [facility]   If you have any questions or concerns before your next appointment please send [place] a message through [place] or call our office at [phone].    TO LEAVE A MESSAGE FOR THE NURSE SELECT OPTION 2, PLEASE LEAVE A MESSAGE INCLUDING: YOUR NAME DATE OF BIRTH CALL BACK NUMBER REASON FOR CALL**this is important as we prioritize the call backs  YOU WILL RECEIVE A CALL BACK THE SAME DAY AS LONG AS YOU CALL BEFORE 4:00  PM

## 2022-10-01 ENCOUNTER — Other Ambulatory Visit: Payer: Self-pay | Admitting: Internal Medicine

## 2022-10-01 DIAGNOSIS — E1169 Type 2 diabetes mellitus with other specified complication: Secondary | ICD-10-CM

## 2022-10-03 ENCOUNTER — Ambulatory Visit: Payer: Medicare HMO

## 2022-10-08 ENCOUNTER — Ambulatory Visit (HOSPITAL_COMMUNITY)
Admission: RE | Admit: 2022-10-08 | Discharge: 2022-10-08 | Disposition: A | Discharge status: Home / Self Care | Payer: Medicare HMO | Source: Ambulatory Visit | Attending: Cardiology | Admitting: Cardiology

## 2022-10-08 DIAGNOSIS — I5022 Chronic systolic (congestive) heart failure: Secondary | ICD-10-CM | POA: Insufficient documentation

## 2022-10-08 LAB — BASIC METABOLIC PANEL
Anion gap: 8 (ref 5–15)
BUN: 14 mg/dL (ref 8–23)
CO2: 29 mmol/L (ref 22–32)
Calcium: 9.2 mg/dL (ref 8.9–10.3)
Chloride: 99 mmol/L (ref 98–111)
Creatinine, Ser: 1.23 mg/dL (ref 0.61–1.24)
GFR, Estimated: >60 mL/min (ref >60)
Glucose, Bld: 108 mg/dL — ABNORMAL HIGH (ref 70–99)
Potassium: 4 mmol/L (ref 3.5–5.1)
Sodium: 136 mmol/L (ref 135–145)

## 2022-10-14 ENCOUNTER — Other Ambulatory Visit (HOSPITAL_COMMUNITY): Payer: Self-pay | Admitting: Cardiology

## 2022-10-19 ENCOUNTER — Other Ambulatory Visit: Payer: Self-pay | Admitting: Internal Medicine

## 2022-10-21 ENCOUNTER — Telehealth: Payer: Self-pay

## 2022-10-21 ENCOUNTER — Ambulatory Visit: Payer: Medicare HMO | Attending: Cardiology | Admitting: *Deleted

## 2022-10-21 DIAGNOSIS — I4891 Unspecified atrial fibrillation: Secondary | ICD-10-CM | POA: Diagnosis not present

## 2022-10-21 DIAGNOSIS — I513 Intracardiac thrombosis, not elsewhere classified: Secondary | ICD-10-CM

## 2022-10-21 LAB — POCT INR: INR: 1.8 — AB (ref 2.0–3.0)

## 2022-10-21 NOTE — Patient Instructions (Signed)
Description   Today take 2 tablets of warfarin then continue on same dosage of Warfarin 1.5 tablets daily except for 1 tablet on Mondays and Wednesdays. Recheck INR in 3 weeks. Coumadin Clinic [phone].

## 2022-10-21 NOTE — Telephone Encounter (Signed)
Inbound fax from DME supplier requesting form be completed and faxed with clinical notes. DME supplies ordered via [employer] through online portal. [facility] [employer] 14 day

## 2022-11-04 DIAGNOSIS — E1169 Type 2 diabetes mellitus with other specified complication: Secondary | ICD-10-CM | POA: Diagnosis not present

## 2022-11-06 ENCOUNTER — Other Ambulatory Visit: Payer: Self-pay | Admitting: Cardiology

## 2022-11-06 DIAGNOSIS — I480 Paroxysmal atrial fibrillation: Secondary | ICD-10-CM

## 2022-11-06 NOTE — Telephone Encounter (Signed)
Warfarin 5mg  Afib, Thrombus  Last INR 10/21/22 Last OV 09/30/22

## 2022-11-10 ENCOUNTER — Other Ambulatory Visit (HOSPITAL_COMMUNITY): Payer: Self-pay | Admitting: Cardiology

## 2022-11-10 ENCOUNTER — Other Ambulatory Visit: Payer: Self-pay | Admitting: Internal Medicine

## 2022-11-11 ENCOUNTER — Ambulatory Visit: Payer: Medicare HMO | Attending: Cardiology

## 2022-11-11 ENCOUNTER — Ambulatory Visit (INDEPENDENT_AMBULATORY_CARE_PROVIDER_SITE_OTHER): Payer: Medicare HMO

## 2022-11-11 DIAGNOSIS — I513 Intracardiac thrombosis, not elsewhere classified: Secondary | ICD-10-CM

## 2022-11-11 DIAGNOSIS — I4891 Unspecified atrial fibrillation: Secondary | ICD-10-CM | POA: Diagnosis not present

## 2022-11-11 DIAGNOSIS — I428 Other cardiomyopathies: Secondary | ICD-10-CM

## 2022-11-11 LAB — POCT INR: INR: 2.4 (ref 2.0–3.0)

## 2022-11-11 NOTE — Patient Instructions (Signed)
Description   Continue on same dosage of Warfarin 1.5 tablets daily except for 1 tablet on Mondays and Wednesdays. Recheck INR in 4 weeks. Coumadin Clinic [phone].

## 2022-11-13 LAB — CUP PACEART REMOTE DEVICE CHECK
Battery Voltage: 3.09 V
Brady Statistic RV Percent Paced: 0 %
Date Time Interrogation Session: 20240528090306
HighPow Impedance: 84 Ohm
Implantable Lead Connection Status: 753985
Implantable Lead Implant Date: 20231127
Implantable Lead Location: 753860
Implantable Lead Model: 436909
Implantable Lead Serial Number: 81547891
Implantable Pulse Generator Implant Date: 20231127
Lead Channel Impedance Value: 716 Ohm
Lead Channel Sensing Intrinsic Amplitude: 19.5 mV
Lead Channel Sensing Intrinsic Amplitude: 5.7 mV
Lead Channel Setting Pacing Amplitude: 2.5 V
Lead Channel Setting Pacing Pulse Width: 0.4 ms
Pulse Gen Model: 429525
Pulse Gen Serial Number: 84932826

## 2022-11-15 ENCOUNTER — Other Ambulatory Visit: Payer: Self-pay | Admitting: Internal Medicine

## 2022-11-24 ENCOUNTER — Encounter: Payer: Self-pay | Admitting: Internal Medicine

## 2022-11-24 ENCOUNTER — Ambulatory Visit (INDEPENDENT_AMBULATORY_CARE_PROVIDER_SITE_OTHER): Payer: Medicare HMO | Admitting: Internal Medicine

## 2022-11-24 VITALS — BP 120/90 | HR 98 | Temp 97.9°F | Ht 73.0 in | Wt 260.0 lb

## 2022-11-24 DIAGNOSIS — E11622 Type 2 diabetes mellitus with other skin ulcer: Secondary | ICD-10-CM | POA: Diagnosis not present

## 2022-11-24 DIAGNOSIS — L97209 Non-pressure chronic ulcer of unspecified calf with unspecified severity: Secondary | ICD-10-CM

## 2022-11-24 MED ORDER — SULFAMETHOXAZOLE-TRIMETHOPRIM 800-160 MG PO TABS: 1.0000 | ORAL_TABLET | Freq: Two times a day (BID) | ORAL | 0 refills | Status: AC

## 2022-11-24 MED ORDER — SILVER SULFADIAZINE 1 % EX CREA: 1.0000 | TOPICAL_CREAM | Freq: Every day | CUTANEOUS | 0 refills | Status: DC

## 2022-11-24 NOTE — Progress Notes (Unsigned)
   Subjective:   Patient ID: [name], male    DOB: 06-22-1955, 67 y.o.   MRN: [id number]  HPI The patient is a 67 YO man coming in for new wound on left calf. About quarter sized and 52-70 weeks old. Not growing but painful slightly. No injury he recalls. Prior severe wound which necrosed on the right calf which started small as well. Prior testing for blood flow.  Review of Systems  Constitutional: Negative.   HENT: Negative.    Eyes: Negative.   Respiratory:  Negative for cough, chest tightness and shortness of breath.   Cardiovascular:  Negative for chest pain, palpitations and leg swelling.  Gastrointestinal:  Negative for abdominal distention, abdominal pain, constipation, diarrhea, nausea and vomiting.  Musculoskeletal: Negative.   Skin:  Positive for wound.  Neurological: Negative.   Psychiatric/Behavioral: Negative.      Objective:  Physical Exam Constitutional:      Appearance: He is well-developed.  HENT:     Head: Normocephalic and atraumatic.  Cardiovascular:     Rate and Rhythm: Normal rate and regular rhythm.  Pulmonary:     Effort: Pulmonary effort is normal. No respiratory distress.     Breath sounds: Normal breath sounds. No wheezing or rales.  Abdominal:     General: Bowel sounds are normal. There is no distension.     Palpations: Abdomen is soft.     Tenderness: There is no abdominal tenderness. There is no rebound.  Musculoskeletal:     Cervical back: Normal range of motion.  Skin:    General: Skin is warm and dry.     Comments: Wound left calf with some surrounding redness about size of a quarter covered with scab unable to stage  Neurological:     Mental Status: He is alert and oriented to person, place, and time.     Coordination: Coordination normal.     Vitals:   11/24/22 1339 11/24/22 1342  BP: (!) 120/90 (!) 120/90  Pulse: 98   Temp: 97.9 F (36.6 C)   TempSrc: Oral   SpO2: 97%   Weight: 260 lb (117.9 kg)   Height: 6\' 1"  (1.854 m)      Assessment & Plan:

## 2022-11-24 NOTE — Patient Instructions (Addendum)
We are sending in bactrim to take 1 pill twice a day for 2 weeks. We have also sent in silvadene to use topically daily on this to prevent infection.  Let us know if this is worsening.

## 2022-11-25 ENCOUNTER — Encounter: Payer: Self-pay | Admitting: Internal Medicine

## 2022-11-25 NOTE — Assessment & Plan Note (Signed)
Left calf and 2019 he has a significant ulcer with breakdown of the skin and fat which started small. Rx bactrim 2 week supply and if worsening, not improving we will get to wound center early.

## 2022-12-02 ENCOUNTER — Ambulatory Visit (INDEPENDENT_AMBULATORY_CARE_PROVIDER_SITE_OTHER): Payer: Medicare HMO | Admitting: Internal Medicine

## 2022-12-02 ENCOUNTER — Encounter: Payer: Self-pay | Admitting: Internal Medicine

## 2022-12-02 VITALS — BP 120/80 | HR 65 | Ht 73.0 in | Wt 263.6 lb

## 2022-12-02 DIAGNOSIS — Z794 Long term (current) use of insulin: Secondary | ICD-10-CM

## 2022-12-02 DIAGNOSIS — E785 Hyperlipidemia, unspecified: Secondary | ICD-10-CM | POA: Diagnosis not present

## 2022-12-02 DIAGNOSIS — E039 Hypothyroidism, unspecified: Secondary | ICD-10-CM

## 2022-12-02 DIAGNOSIS — E669 Obesity, unspecified: Secondary | ICD-10-CM

## 2022-12-02 DIAGNOSIS — E1169 Type 2 diabetes mellitus with other specified complication: Secondary | ICD-10-CM | POA: Diagnosis not present

## 2022-12-02 DIAGNOSIS — E119 Type 2 diabetes mellitus without complications: Secondary | ICD-10-CM

## 2022-12-02 DIAGNOSIS — Z7984 Long term (current) use of oral hypoglycemic drugs: Secondary | ICD-10-CM

## 2022-12-02 DIAGNOSIS — Z7985 Long-term (current) use of injectable non-insulin antidiabetic drugs: Secondary | ICD-10-CM | POA: Diagnosis not present

## 2022-12-02 LAB — TSH: TSH: 0.79 u[IU]/mL (ref 0.35–5.50)

## 2022-12-02 LAB — POCT GLYCOSYLATED HEMOGLOBIN (HGB A1C): Hemoglobin A1C: 5.7 % — AB (ref 4.0–5.6)

## 2022-12-02 LAB — MICROALBUMIN / CREATININE URINE RATIO
Creatinine,U: 79.1 mg/dL
Microalb Creat Ratio: 36.5 mg/g — ABNORMAL HIGH (ref 0.0–30.0)
Microalb, Ur: 28.8 mg/dL — ABNORMAL HIGH (ref 0.0–1.9)

## 2022-12-02 LAB — T4, FREE: Free T4: 0.89 ng/dL (ref 0.60–1.60)

## 2022-12-02 NOTE — Progress Notes (Signed)
Patient ID: [name], male   DOB: 07-30-1955, 67 y.o.   MRN: [id number]   HPI: [name] is a 67 y.o.-year-old male, initially referred by his PCP, Dr. Okey Dupre, returning for follow-up for DM2, dx in 2017, insulin-dependent, uncontrolled, with complications (CHF, Afib/AFlutter, CKD, h/o calf diabetic ulcer).  Last visit 6 mo ago.  Interim history: He denies blurred vision, nausea.  He has nocturia-chronic. He has a posterior lower L leg ulcer. He is treated with ABx. He feels in improved.  He had another ulcer in the other leg in 2020, before he was diagnosed with diabetes and he had to have extensive treatment for this.  It did heal up at the end.  Reviewed HbA1c levels Lab Results  Component Value Date   HGBA1C 5.7 (A) 05/20/2022   HGBA1C 5.9 (A) 11/19/2021   HGBA1C 6.0 (A) 05/16/2021   HGBA1C 6.0 (A) 11/08/2020   HGBA1C 5.3 08/04/2020   HGBA1C 5.7 (A) 06/29/2020   HGBA1C 5.3 02/24/2020   HGBA1C 14.2 (H) 09/04/2019   HGBA1C 15.0 (H) 09/03/2019   HGBA1C 6.9 (H) 03/08/2019   In the past, he was on: - Levemir 40 units 2x a day - Novolog 5 units + SSI 3x a day, before meals: BG 70-120: 0 Units, BG 121-150:3 units, BG 151-200:4 Units, BG 201-250:7 units BG 251-300: 11 Units, 301-350 : 15 units. If BG >350: Call your primary care doctor He was on Glipizide since 2017.  Now on:  - Jardiance 10 mg before b'fast - Ozempic 0.5 mg weekly in a.m.  - Tresiba U200 66 >> 50 >> 40 >> 32 >> 28 units daily - Novolog 8-12 units based on the size of the meal (8-12-8 units) >> 8-10 (12) before meals >> tried to stop but had to restart >> 5 to 8 >> 8-12 units before a larger meal only  He checks his sugars more than 4 times a day with his freestyle libre CGM - from [facility]:  Prev.:  Previously:   Lowest sugar was  58 (delayed lunch) >> ...68 >> 70  >> 78; he has hypoglycemia awareness in the 70s. Highest sugar was 330 >> .[name].230 >> 200 >> 190. He wasadmitted in 08/2019 with  hyperosmolar hyperglycemic nonketotic state, and was found to have a glucose of 1186, along with hyponatremia and AKI.  An HbA1c level was very high.  Glucometer: [facility]   Pt's meals are: - Breakfast: fruit >>  >> -  almond milk with bran flakes, berries, nuts - Lunch: salad - Dinner: salad, chili, hamburger patty, grilled chicken -moved dinner earlier - Snacks:- He saw nutrition in 2016.  -+ CKD, last BUN/creatinine:  Lab Results  Component Value Date   BUN 14 10/08/2022   BUN 13 09/30/2022   CREATININE 1.23 10/08/2022   CREATININE 1.21 09/30/2022  On Cozaar 50.  -+ HL; last set of lipids: Lab Results  Component Value Date   CHOL 142 09/30/2022   HDL 66 09/30/2022   LDLCALC 62 09/30/2022   LDLDIRECT 139.0 11/19/2021   TRIG 71 09/30/2022   CHOLHDL 2.2 09/30/2022  On Crestor 10.  - last eye exam was in 10/2021: [facility] DR reportedly[facility] [place]. Coming up in 01/2023.  -No numbness and tingling in his feet.  He has a history of right calf ulcer- he required skin graft - 2020 - had a terrible time then.  Last foot exam 05/2022.  Pt has no FH of DM.  Uncontrolled hypothyroidism:  Reviewed his TFTs: Lab Results  Component Value Date   TSH 1.79 11/19/2021   TSH 3.280 04/23/2021   TSH 3.401 01/09/2021   TSH 3.52 06/29/2020   TSH 4.61 (H) 05/16/2020   TSH 33.362 (H) 09/03/2019   TSH 13.910 (H) 02/02/2019   TSH 25.834 (H) 01/31/2019   TSH 4.800 (H) 05/03/2018   TSH 3.39 11/26/2017   He takes levothyroxine 50 mcg daily: - in am - fasting - now drinks black coffee - at least 30 min from b'fast - no calcium - no iron - + multivitamins in the evening - no PPIs - not on Biotin  He has a history of A. Fib and was found to have a thrombus in the left atrial appendage.  He had cardioversion in 08/2020.  He also has chronic systolic CHF.   He has a history of HTN, OSA.  Also, hereditary hemochromatosis, polycythemia. In the past he was drinking 4-5 beers  every day.  Previously was drinking hard liquor.  He stopped drinking alcohol in 2021.  ROS: + See HPI  I reviewed pt's medications, allergies, PMH, social hx, family hx, and changes were documented in the history of present illness. Otherwise, unchanged from my initial visit note.  Past Medical History:  Diagnosis Date   Asthma    as a teenager - does not use an inhaler although pt said he has an albuteral inhaler   Atrial fibrillation (HCC)    persistant 02/2009   Atrial flutter (HCC)    s/p CTI ablation 08/06/10   Cataract    Chronic rhinitis    Colon polyp    Congestive heart failure (HCC)    Diverticulosis    colonoscopy 04/03/2009   Hyperlipidemia    Hypertension    Morbid obesity (HCC)    target weight = 219  for BMI < 30   Nonischemic cardiomyopathy (HCC)    tachycardia mediated   Seasonal allergies    Sleep apnea    original 2001 - wears C-PaP   Type 2 diabetes mellitus (HCC)    Past Surgical History:  Procedure Laterality Date   ATRIAL FIBRILLATION ABLATION N/A 12/14/2018   Procedure: ATRIAL FIBRILLATION ABLATION;  Surgeon: Hillis Range, MD;  Location: [facility] INVASIVE CV LAB;  Service: Cardiovascular;  Laterality: N/A;   ATRIAL FIBRILLATION ABLATION N/A 10/18/2020   Procedure: ATRIAL FIBRILLATION ABLATION;  Surgeon: Hillis Range, MD;  Location: [facility] INVASIVE CV LAB;  Service: Cardiovascular;  Laterality: N/A;   atrial flutter ablation  08/06/10   CARDIOVERSION N/A 07/05/2018   Procedure: CARDIOVERSION;  Surgeon: Jodelle Red, MD;  Location: [facility] ENDOSCOPY;  Service: Cardiovascular;  Laterality: N/A;   CARDIOVERSION N/A 08/08/2020   Procedure: CARDIOVERSION;  Surgeon: Laurey Morale, MD;  Location: [facility] ENDOSCOPY;  Service: Cardiovascular;  Laterality: N/A;   CARDIOVERSION N/A 08/29/2020   Procedure: CARDIOVERSION;  Surgeon: Dolores Patty, MD;  Location: [facility] ENDOSCOPY;  Service: Cardiovascular;  Laterality: N/A;   COLONOSCOPY     ICD IMPLANT N/A 05/12/2022    Procedure: ICD IMPLANT;  Surgeon: Marinus Maw, MD;  Location: [facility] INVASIVE CV LAB;  Service: Cardiovascular;  Laterality: N/A;   POLYPECTOMY     RIGHT HEART CATH N/A 08/06/2020   Procedure: RIGHT HEART CATH;  Surgeon: Laurey Morale, MD;  Location: [facility] INVASIVE CV LAB;  Service: Cardiovascular;  Laterality: N/A;   RIGHT/LEFT HEART CATH AND CORONARY ANGIOGRAPHY N/A 11/01/2021   Procedure: RIGHT/LEFT HEART CATH AND CORONARY ANGIOGRAPHY;  Surgeon: Laurey Morale, MD;  Location: [facility] INVASIVE CV LAB;  Service: Cardiovascular;  Laterality: N/A;   TEE WITHOUT CARDIOVERSION N/A 12/14/2018   Procedure: TRANSESOPHAGEAL ECHOCARDIOGRAM (TEE);  Surgeon: Hillis Range, MD;  Location: [facility] INVASIVE CV LAB;  Service: Cardiovascular;  Laterality: N/A;   TEE WITHOUT CARDIOVERSION N/A 08/03/2020   Procedure: TRANSESOPHAGEAL ECHOCARDIOGRAM (TEE);  Surgeon: Thurmon Fair, MD;  Location: [facility] ENDOSCOPY;  Service: Cardiovascular;  Laterality: N/A;   TEE WITHOUT CARDIOVERSION N/A 08/08/2020   Procedure: TRANSESOPHAGEAL ECHOCARDIOGRAM (TEE);  Surgeon: Laurey Morale, MD;  Location: [facility] ENDOSCOPY;  Service: Cardiovascular;  Laterality: N/A;   TEE WITHOUT CARDIOVERSION N/A 10/18/2020   Procedure: TRANSESOPHAGEAL ECHOCARDIOGRAM (TEE);  Surgeon: Jake Bathe, MD;  Location: [facility] ENDOSCOPY;  Service: Cardiovascular;  Laterality: N/A;   Social History   Socioeconomic History   Marital status: Single    Spouse name: Not on file   Number of children: 0   Years of education: Not on file   Highest education level: Not on file  Occupational History   Occupation: Sales  Tobacco Use   Smoking status: Never   Smokeless tobacco: Never  Vaping Use   Vaping Use: Never used  Substance and Sexual Activity   Alcohol use: Yes    Alcohol/week: 21.0 standard drinks of alcohol    Types: 21 Cans of beer per week    Comment: previously quite heavy, working on cessation    Drug use: No   Sexual activity: Not on file  Other Topics  Concern   Not on file  Social History Narrative   Lives in [place] and works for [employer]   Social Determinants of Health   Financial Resource Strain: Low Risk  (06/20/2021)   Overall Financial Resource Strain (CARDIA)    Difficulty of Paying Living Expenses: Not hard at all  Food Insecurity: No Food Insecurity (07/15/2022)   Hunger Vital Sign    Worried About Running Out of Food in the Last Year: Never true    Ran Out of Food in the Last Year: Never true  Transportation Needs: No Transportation Needs (07/15/2022)   PRAPARE - Administrator, Civil Service (Medical): No    Lack of Transportation (Non-Medical): No  Physical Activity: Sufficiently Active (06/20/2021)   Exercise Vital Sign    Days of Exercise per Week: 5 days    Minutes of Exercise per Session: 30 min  Stress: No Stress Concern Present (06/20/2021)   [employer] of Occupational Health - Occupational Stress Questionnaire    Feeling of Stress : Not at all  Social Connections: Unknown (06/20/2021)   Social Connection and Isolation Panel [NHANES]    Frequency of Communication with Friends and Family: More than three times a week    Frequency of Social Gatherings with Friends and Family: Twice a week    Attends Religious Services: Patient declined    Database administrator or Organizations: Patient declined    Attends Banker Meetings: Patient declined    Marital Status: Never married  Intimate Partner Violence: Not At Risk (06/20/2021)   Humiliation, Afraid, Rape, and Kick questionnaire    Fear of Current or Ex-Partner: No    Emotionally Abused: No    Physically Abused: No    Sexually Abused: No   Current Outpatient Medications on File Prior to Visit  Medication Sig Dispense Refill   carvedilol (COREG) 12.5 MG tablet TAKE 1.5 TABLETS (18.75 MG TOTAL) BY MOUTH 2 (TWO) TIMES DAILY WITH A MEAL. 270 tablet 3  cholecalciferol (VITAMIN D3) 25 MCG (1000 UNIT) tablet Take 1,000 Units by mouth at  bedtime.     Continuous Blood Gluc Receiver (FREESTYLE LIBRE 14 DAY READER) DEVI USE AS DIRECTED 1 each 11   doxylamine, Sleep, (UNISOM) 25 MG tablet Take 50 mg by mouth at bedtime.     furosemide (LASIX) 20 MG tablet Take 1 tablet (20 mg total) by mouth daily. 90 tablet 2   insulin aspart (NOVOLOG FLEXPEN) 100 UNIT/ML FlexPen INJECT 8-12 UNITS INTO THE SKIN 3 (THREE) TIMES DAILY WITH MEALS. 15 mL 3   insulin degludec (TRESIBA FLEXTOUCH) 200 UNIT/ML FlexTouch Pen Inject 28 Units into the skin daily. 9 mL 1   Insulin Pen Needle (BD PEN NEEDLE NANO 2ND GEN) 32G X 4 MM MISC USE 4 TIMES A DAY AS DIRECTED 400 each 1   JARDIANCE 10 MG TABS tablet TAKE 1 TABLET BY MOUTH EVERY DAY 30 tablet 5   levothyroxine (SYNTHROID) 50 MCG tablet TAKE 1 TABLET BY MOUTH EVERY DAY BEFORE BREAKFAST 90 tablet 2   mometasone (NASONEX) 50 MCG/ACT nasal spray Place 2 sprays into the nose daily as needed (allergies or rhinitis). 1 each 11   Multiple Vitamins-Minerals (MULTIVITAMIN WITH MINERALS) tablet Take 2 tablets by mouth at bedtime. Chewable     rosuvastatin (CRESTOR) 10 MG tablet TAKE 1 TABLET BY MOUTH EVERY DAY 90 tablet 0   sacubitril-valsartan (ENTRESTO) 97-103 MG Take 1 tablet by mouth 2 (two) times daily. 180 tablet 3   Semaglutide,0.25 or 0.5MG /DOS, (OZEMPIC, 0.25 OR 0.5 MG/DOSE,) 2 MG/3ML SOPN INJECT 0.5 MG INTO THE SKIN ONCE A WEEK. 9 mL 1   silver sulfADIAZINE (SILVADENE) 1 % cream Apply 1 Application topically daily. 50 g 0   spironolactone (ALDACTONE) 25 MG tablet Take 1 tablet (25 mg total) by mouth daily. 90 tablet 3   sulfamethoxazole-trimethoprim (BACTRIM DS) 800-160 MG tablet Take 1 tablet by mouth 2 (two) times daily for 14 days. 28 tablet 0   SYMBICORT 80-4.5 MCG/ACT inhaler INHALE 2 PUFFS INTO THE LUNGS DAILY AS NEEDED (SHORTNESS OF BREATH). 30.6 each 1   vitamin C (ASCORBIC ACID) 500 MG tablet Take 500 mg by mouth in the morning.     warfarin (COUMADIN) 5 MG tablet TAKE 1 TABLET TO 1 AND 1/2 TABLETS  BY MOUTH DAILY AS DIRECTED BY THE COUMADIN CLINIC. 135 tablet 1   No current facility-administered medications on file prior to visit.   No Known Allergies Family History  Problem Relation Age of Onset   Hypertension Mother    Kidney failure Mother        Due to sepsis   COPD Father    Colon cancer Brother        dx in 74's   Esophageal cancer Neg Hx    Rectal cancer Neg Hx    Stomach cancer Neg Hx    PE: BP 120/80   Pulse 65   Ht 6\' 1"  (1.854 m)   Wt 263 lb 9.6 oz (119.6 kg)   SpO2 96%   BMI 34.78 kg/m   Wt Readings from Last 3 Encounters:  12/02/22 263 lb 9.6 oz (119.6 kg)  11/24/22 260 lb (117.9 kg)  09/30/22 261 lb 3.2 oz (118.5 kg)   Constitutional: overweight, in NAD Eyes: EOMI, no exophthalmos ENT: no thyromegaly, no cervical lymphadenopathy Cardiovascular: RRR, No MRG Respiratory: CTA B Musculoskeletal: no deformities Skin: no rashes Neurological: + tremor with outstretched hands  ASSESSMENT: 1. DM2, insulin-dependent, uncontrolled, with complications - CHF -  A. fib/a flutter - CKD - h/o Diabetic ulcer of calf - h/o HHNK - admitted with a glucose of >1000  -started on basal bolus regimen in the hospital  2.  Hypothyroidism - Uncontrolled  3. Obesity class 1  4. HL  PLAN:  1. Patient with uncontrolled, type 2 diabetes, insulin-dependent, on SGLT2 inhibitor, basal bolus insulin regimen and weekly GLP-1 receptor agonist with significantly diabetes control after HbA1c decreased from 14.2% to a nadir of 5.3%.  At last visit, HbA1c was excellent, at 5.7%.  Sugars are fluctuating within a narrow range within the target interval only mild occasional lows.  Upon questioning, he was taking NovoLog if sugars were higher than meals, retroactively.  We discussed about taking this before a larger meal, 15 min before eating.  We otherwise did not change his regimen. CGM interpretation: -At today's visit, we reviewed his CGM downloads: It appears that 98% of values  are in target range (goal >70%), while 2% are higher than 180 (goal <25%), and 0% are lower than 70 (goal <4%).  The calculated average blood sugar is 130.  The projected HbA1c for the next 3 months (GMI) is 6.4%. -Reviewing the CGM trends, sugars are fluctuating within the target range with only an occasional mild hyperglycemic spike up to 190 after breakfast.  Sugars are fluctuating within a very narrow range, with a coefficient of variation of 16.4%, which is excellent.  I did not suggest a change in regimen for now. - I suggested to:  Patient Instructions  Please continue: - Jardiance 10 mg before b'fast - Ozempic 0.5 mg weekly in a.m.  - Evaristo Bury U200 28 units daily - Novolog 8-12 units before a larger meal  Please continue Levothyroxine 50 mcg daily.  Take the thyroid hormone every day, with water, at least 30 minutes before breakfast, separated by at least 4 hours from: - acid reflux medications - calcium - iron - multivitamins  Please return in 6 months.   - we checked his HbA1c: 5.7% (stable) - advised to check sugars at different times of the day - 4x a day, rotating check times - advised for yearly eye exams >> he is due but has an appointment coming up in 2 months - return to clinic in 6 months  2.  Hypothyroidism - latest thyroid labs reviewed with pt. >> normal: Lab Results  Component Value Date   TSH 1.79 11/19/2021  - he continues on LT4 50 mcg daily - pt feels good on this dose. - we discussed about taking the thyroid hormone every day, with water, >30 minutes before breakfast, separated by >4 hours from acid reflux medications, calcium, iron, multivitamins. Pt. is taking it correctly. - will check thyroid tests today: TSH and fT4 - If labs are abnormal, he will need to return for repeat TFTs in 1.5 months  3. Obesity class 1 -Will continue Ozempic which should also help with weight loss -He gained 5 pounds before last visit but lost 8 pounds since then  4.  HL -Reviewed the latest lipid panel from 09/2022: Fractions at goal, except for LDL slightly above her goal of less than 55: Lab Results  Component Value Date   CHOL 142 09/30/2022   HDL 66 09/30/2022   LDLCALC 62 09/30/2022   LDLDIRECT 139.0 11/19/2021   TRIG 71 09/30/2022   CHOLHDL 2.2 09/30/2022  -He is on Crestor 10 mg daily without side effects  Carlus Pavlov, MD PhD [facility] Endocrinology

## 2022-12-02 NOTE — Patient Instructions (Addendum)
Please continue: - Jardiance 10 mg before b'fast - Ozempic 0.5 mg weekly in a.m.  - Evaristo Bury U200 28 units daily - Novolog 8-12 units before a larger meal  Please continue Levothyroxine 50 mcg daily.  Take the thyroid hormone every day, with water, at least 30 minutes before breakfast, separated by at least 4 hours from: - acid reflux medications - calcium - iron - multivitamins  Please return in 6 months.

## 2022-12-04 ENCOUNTER — Encounter: Payer: Self-pay | Admitting: Internal Medicine

## 2022-12-04 NOTE — Telephone Encounter (Signed)
Please Advise

## 2022-12-05 NOTE — Progress Notes (Signed)
Remote ICD transmission.   

## 2022-12-09 ENCOUNTER — Ambulatory Visit: Payer: Medicare HMO | Attending: Cardiology

## 2022-12-09 DIAGNOSIS — Z5181 Encounter for therapeutic drug level monitoring: Secondary | ICD-10-CM

## 2022-12-09 DIAGNOSIS — I513 Intracardiac thrombosis, not elsewhere classified: Secondary | ICD-10-CM

## 2022-12-09 DIAGNOSIS — I4891 Unspecified atrial fibrillation: Secondary | ICD-10-CM | POA: Diagnosis not present

## 2022-12-09 LAB — POCT INR: INR: 4.7 — AB (ref 2.0–3.0)

## 2022-12-09 NOTE — Patient Instructions (Signed)
Continue on same dosage of Warfarin 1.5 tablets daily except for 1 tablet on Mondays and Wednesdays. Recheck INR in 3 weeks. Coumadin Clinic [phone].

## 2022-12-10 ENCOUNTER — Encounter: Payer: Self-pay | Admitting: Internal Medicine

## 2022-12-10 ENCOUNTER — Ambulatory Visit (INDEPENDENT_AMBULATORY_CARE_PROVIDER_SITE_OTHER): Payer: Medicare HMO | Admitting: Internal Medicine

## 2022-12-10 VITALS — BP 120/80 | HR 64 | Temp 97.6°F | Ht 73.0 in | Wt 266.0 lb

## 2022-12-10 DIAGNOSIS — E11622 Type 2 diabetes mellitus with other skin ulcer: Secondary | ICD-10-CM | POA: Diagnosis not present

## 2022-12-10 DIAGNOSIS — Z7985 Long-term (current) use of injectable non-insulin antidiabetic drugs: Secondary | ICD-10-CM | POA: Diagnosis not present

## 2022-12-10 DIAGNOSIS — Z7984 Long term (current) use of oral hypoglycemic drugs: Secondary | ICD-10-CM

## 2022-12-10 DIAGNOSIS — L97209 Non-pressure chronic ulcer of unspecified calf with unspecified severity: Secondary | ICD-10-CM

## 2022-12-10 MED ORDER — SULFAMETHOXAZOLE-TRIMETHOPRIM 800-160 MG PO TABS: 1.0000 | ORAL_TABLET | Freq: Two times a day (BID) | ORAL | 0 refills | Status: AC

## 2022-12-10 MED ORDER — IBUPROFEN 800 MG PO TABS: 800.0000 mg | ORAL_TABLET | Freq: Three times a day (TID) | ORAL | 0 refills | Status: DC | PRN

## 2022-12-10 NOTE — Patient Instructions (Addendum)
We have sent in the ibuprofen and another 2 weeks of the bactrim

## 2022-12-10 NOTE — Progress Notes (Signed)
   Subjective:   Patient ID: [name], male    DOB: 1955/10/15, 67 y.o.   MRN: [id number]  HPI The patient is a 67 YO man coming in for recheck of wound on calf. Seems stable to mild improvement but still redness and took antibiotics for 2 weeks.   Review of Systems  Constitutional: Negative.   HENT: Negative.    Eyes: Negative.   Respiratory:  Negative for cough, chest tightness and shortness of breath.   Cardiovascular:  Negative for chest pain, palpitations and leg swelling.  Gastrointestinal:  Negative for abdominal distention, abdominal pain, constipation, diarrhea, nausea and vomiting.  Musculoskeletal: Negative.   Skin:  Positive for wound.  Neurological: Negative.   Psychiatric/Behavioral: Negative.      Objective:  Physical Exam Constitutional:      Appearance: He is well-developed.  HENT:     Head: Normocephalic and atraumatic.  Cardiovascular:     Rate and Rhythm: Normal rate and regular rhythm.  Pulmonary:     Effort: Pulmonary effort is normal. No respiratory distress.     Breath sounds: Normal breath sounds. No wheezing or rales.  Abdominal:     General: Bowel sounds are normal. There is no distension.     Palpations: Abdomen is soft.     Tenderness: There is no abdominal tenderness. There is no rebound.  Musculoskeletal:     Cervical back: Normal range of motion.  Skin:    General: Skin is warm and dry.     Comments: Wound calf circular with eschar and ring of redness surrounding. Wound about 2 cm circular  Neurological:     Mental Status: He is alert and oriented to person, place, and time.     Coordination: Coordination normal.     Vitals:   12/10/22 0907  BP: 120/80  Pulse: 64  Temp: 97.6 F (36.4 C)  TempSrc: Oral  SpO2: 96%  Weight: 266 lb (120.7 kg)  Height: 6\' 1"  (1.854 m)    Assessment & Plan:

## 2022-12-10 NOTE — Assessment & Plan Note (Signed)
Rx bactrim 2 weeks and continue silvadene daily ointment. This appears to be improving. Given prior severe ulcer on the other leg we will monitor this closely.

## 2022-12-11 ENCOUNTER — Telehealth: Payer: Self-pay

## 2022-12-11 NOTE — Telephone Encounter (Signed)
Pt called coumadin clinic and stated he was instructed by Sigurd Sos, RN to call with the name of antibiotics he was prescribed. Per med list, pt is on sulfamethoxazole (Bactrim). Pt has been on this medication for 2 weeks and will continue for 2 more weeks. Scheduled pt to have INR checked on Monday. 12/15/22 at coumadin clinic since INR was supra-therapeutic on 12/09/22 and his next appt was not scheduled until 12/30/22. I educated pt on how this antibiotics can cause INR to be elevated and monitoring INR closely is very important. Pt verbalized understanding.

## 2022-12-12 NOTE — Addendum Note (Signed)
Addended by: Pollie Meyer on: 12/12/2022 02:21 PM   Modules accepted: Orders

## 2022-12-13 ENCOUNTER — Other Ambulatory Visit: Payer: Self-pay | Admitting: Internal Medicine

## 2022-12-15 ENCOUNTER — Ambulatory Visit: Payer: Medicare HMO | Attending: Cardiology

## 2022-12-15 DIAGNOSIS — I4891 Unspecified atrial fibrillation: Secondary | ICD-10-CM | POA: Diagnosis not present

## 2022-12-15 DIAGNOSIS — I513 Intracardiac thrombosis, not elsewhere classified: Secondary | ICD-10-CM

## 2022-12-15 LAB — POCT INR: INR: 2.9 (ref 2.0–3.0)

## 2022-12-15 NOTE — Patient Instructions (Addendum)
Description   Eat extra greens until you complete the antibiotics. Continue on same dosage of Warfarin 1.5 tablets daily except for 1 tablet on Mondays and Wednesdays.  Recheck INR in 3 weeks.  Coumadin Clinic [phone].

## 2022-12-26 ENCOUNTER — Encounter: Payer: Self-pay | Admitting: Internal Medicine

## 2022-12-29 NOTE — Telephone Encounter (Signed)
Would like for me to schedule a follow up visit?

## 2023-01-05 ENCOUNTER — Ambulatory Visit: Payer: Medicare HMO | Attending: Cardiology

## 2023-01-05 DIAGNOSIS — I513 Intracardiac thrombosis, not elsewhere classified: Secondary | ICD-10-CM

## 2023-01-05 DIAGNOSIS — I4891 Unspecified atrial fibrillation: Secondary | ICD-10-CM | POA: Diagnosis not present

## 2023-01-05 DIAGNOSIS — Z5181 Encounter for therapeutic drug level monitoring: Secondary | ICD-10-CM | POA: Diagnosis not present

## 2023-01-05 LAB — POCT INR: INR: 2.5 (ref 2.0–3.0)

## 2023-01-05 MED ORDER — IBUPROFEN 800 MG PO TABS: 800.0000 mg | ORAL_TABLET | Freq: Three times a day (TID) | ORAL | 0 refills | Status: DC | PRN

## 2023-01-05 NOTE — Patient Instructions (Signed)
Continue on same dosage of Warfarin 1.5 tablets daily except for 1 tablet on Mondays and Wednesdays.  Recheck INR in 6 weeks.  Coumadin Clinic [phone].

## 2023-01-05 NOTE — Addendum Note (Signed)
Addended by: Myrlene Broker on: 01/05/2023 07:37 AM   Modules accepted: Orders

## 2023-01-20 DIAGNOSIS — H25813 Combined forms of age-related cataract, bilateral: Secondary | ICD-10-CM | POA: Diagnosis not present

## 2023-01-20 DIAGNOSIS — Z794 Long term (current) use of insulin: Secondary | ICD-10-CM | POA: Diagnosis not present

## 2023-01-20 DIAGNOSIS — H11153 Pinguecula, bilateral: Secondary | ICD-10-CM | POA: Diagnosis not present

## 2023-01-20 DIAGNOSIS — H524 Presbyopia: Secondary | ICD-10-CM | POA: Diagnosis not present

## 2023-01-20 DIAGNOSIS — H43393 Other vitreous opacities, bilateral: Secondary | ICD-10-CM | POA: Diagnosis not present

## 2023-01-20 DIAGNOSIS — E119 Type 2 diabetes mellitus without complications: Secondary | ICD-10-CM | POA: Diagnosis not present

## 2023-01-20 LAB — HM DIABETES EYE EXAM

## 2023-01-27 ENCOUNTER — Ambulatory Visit: Payer: Medicare HMO | Admitting: Emergency Medicine

## 2023-01-28 DIAGNOSIS — H52209 Unspecified astigmatism, unspecified eye: Secondary | ICD-10-CM | POA: Diagnosis not present

## 2023-01-28 DIAGNOSIS — H5203 Hypermetropia, bilateral: Secondary | ICD-10-CM | POA: Diagnosis not present

## 2023-01-28 DIAGNOSIS — H524 Presbyopia: Secondary | ICD-10-CM | POA: Diagnosis not present

## 2023-01-30 ENCOUNTER — Encounter (HOSPITAL_BASED_OUTPATIENT_CLINIC_OR_DEPARTMENT_OTHER): Payer: Self-pay | Admitting: Cardiology

## 2023-01-30 ENCOUNTER — Ambulatory Visit (INDEPENDENT_AMBULATORY_CARE_PROVIDER_SITE_OTHER): Payer: Medicare HMO | Admitting: Cardiology

## 2023-01-30 VITALS — BP 120/88 | HR 62 | Ht 73.0 in | Wt 264.0 lb

## 2023-01-30 DIAGNOSIS — I513 Intracardiac thrombosis, not elsewhere classified: Secondary | ICD-10-CM

## 2023-01-30 DIAGNOSIS — D6869 Other thrombophilia: Secondary | ICD-10-CM | POA: Diagnosis not present

## 2023-01-30 DIAGNOSIS — I428 Other cardiomyopathies: Secondary | ICD-10-CM

## 2023-01-30 DIAGNOSIS — I2510 Atherosclerotic heart disease of native coronary artery without angina pectoris: Secondary | ICD-10-CM

## 2023-01-30 DIAGNOSIS — I480 Paroxysmal atrial fibrillation: Secondary | ICD-10-CM | POA: Diagnosis not present

## 2023-01-30 DIAGNOSIS — Z7901 Long term (current) use of anticoagulants: Secondary | ICD-10-CM | POA: Diagnosis not present

## 2023-01-30 NOTE — Progress Notes (Signed)
Cardiology Office Note:  .    Date:  01/30/2023  ID:  [name], DOB 1956/03/18, MRN [id number] PCP: Myrlene Broker, MD  [facility] Providers Cardiologist:  Jodelle Red, MD Electrophysiologist:  Hillis Range, MD (Inactive)     History of Present Illness: .    [name] is a 67 y.o. male with a hx of persistent atrial fibrillation, atrial flutter with prior ablation in 2012, prior cardioversions, HTN, chronic systolic heart failure (thought to be tachycardia induced) with EF 30-35% on TTE 04/2018, OSA who is seen in follow up today. I first saw him as a new consult 12/28/18.   At his 12/2021 visit, he was doing well and staying active with playing golf. Blood pressure was 96/68 in the office although he was asymptomatic. Home readings were averaging 100-110/70's. He was tolerating Crestor. No bleeding issues on Coumadin. Planned to see a geneticist in August. He has followed with Dr. 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 cMRI with scar. Recommended for Biotronik ICD, which was successfully implanted 05/12/2022. He followed up with Prince Rome, FNP 09/30/2022 and was doing well.  Today, he states he is feeling fine aside from his slow-healing diabetic ulcer of his left calf, new about one month ago. He complains of throbbing pain in his calf for which he was prescribed 800 mg ibuprofen per his PCP. He has been taking half of the 800 mg tablet twice daily. Generally he is able to walk fine. The throbbing pain is mostly bothersome when trying to sleep at night. Also was given bactrim and silvadene ointment.  INR has been stable. No bleeding issues. He is tolerating his medications.  He denies any palpitations, chest pain, shortness of breath, peripheral edema, lightheadedness, headaches, syncope, orthopnea, or PND.  ROS:  Please see the history of present illness. ROS otherwise negative  except as noted.  (+) Intermittent, throbbing pain of left calf  Studies Reviewed: [name]    EKG Interpretation Date/Time:  Friday January 30 2023 13:14:40 EDT Ventricular Rate:  62 PR Interval:  180 QRS Duration:  140 QT Interval:  462 QTC Calculation: 468 R Axis:   -36  Text Interpretation: Sinus rhythm with Premature atrial complexes Left axis deviation Non-specific intra-ventricular conduction block Confirmed by Jodelle Red [phone]) on 01/30/2023 1:17:25 PM    Echo  05/29/2022: Sonographer Comments: Technically difficult study due to poor echo windows  and patient is obese. Image acquisition challenging due to patient body  habitus and Image acquisition challenging due to respiratory motion.   IMPRESSIONS   1. Left ventricular ejection fraction, by estimation, is 50%. The left  ventricle has mildly decreased function. The left ventricle demonstrates  global hypokinesis. Severe basal to mid asymmetric septal left ventricular  hypertrophy. Left ventricular  diastolic parameters are consistent with Grade I diastolic dysfunction  (impaired relaxation). No LV outflow gradient or mitral valve systolic  anterior motion noted.   2. Right ventricular systolic function is normal. The right ventricular  size is mildly enlarged. Tricuspid regurgitation signal is inadequate for  assessing PA pressure.   3. Left atrial size was mildly dilated.   4. The mitral valve is normal in structure. Mild to moderate mitral valve  regurgitation. No evidence of mitral stenosis.   5. The aortic valve is tricuspid. Aortic valve regurgitation is not  visualized. No aortic stenosis is present.   6. Aortic dilatation noted. There is mild dilatation of  the aortic root,  measuring 39 mm.   7. The inferior vena cava is normal in size with greater than 50%  respiratory variability, suggesting right atrial pressure of 3 mmHg.   Physical Exam:    VS:  BP 120/88   Pulse 62   Ht 6\' 1"  (1.854 m)   Wt  264 lb (119.7 kg)   SpO2 95%   BMI 34.83 kg/m    Wt Readings from Last 3 Encounters:  01/30/23 264 lb (119.7 kg)  12/10/22 266 lb (120.7 kg)  12/02/22 263 lb 9.6 oz (119.6 kg)    GEN: Well nourished, well developed in no acute distress HEENT: Normal, moist mucous membranes NECK: No JVD CARDIAC: regular rhythm, normal S1 and S2, no rubs or gallops. No murmur. VASCULAR: Radial pulses 2+ bilaterally. No carotid bruits. R pedal pulses intact. L side unable to feel pedal pulses due to edema but good capillary refill RESPIRATORY:  Clear to auscultation without rales, wheezing or rhonchi  ABDOMEN: Soft, non-tender, non-distended MUSCULOSKELETAL:  Ambulates independently SKIN: Warm and dry, Left ankle firm edema; ulcer of left calf NEUROLOGIC:  Alert and oriented x 3. No focal neuro deficits noted. PSYCHIATRIC:  Normal affect   ASSESSMENT AND PLAN: .    History of atrial fibrillation and atrial flutter, s/p prior cardioversions and CTI -sinus rhythm today -CHADSVASC=3 -Has OSA, continue CPAP -continue coumadin given LAA thrombus with both rivaroxaban and dabigatran.   Nonischemic cardiomyopathy, with chronic systolic and diastolic heart failure:  -euvolemic, NYHA class I today -follows with Dr. Shirlee Latch -does have prior history of hospitalization for heart failure requiring milrinone -cath with nonobstructive CAD, now s/p ICD implantation -continue carvedilol, empagliflozin, entresto, spironolactone, semaglutide -on GLP1RA and SGLT2i given diabetes. Followed by Dr. Lafe Garin in endocrinology. 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 Also heterozygous for hemochromatosis gene but has not required phlebotomor  L calf ulcer -previously had a severe one on the R side, took >1 year to heal and multiple skin grafts -had imaging of his lower extremity arteries and veins per report (I cannot see), told it was normal -would check ABI once wound is healed   Nonobstructive CAD -continue  rosuvastatin -no aspirin as he is on coumadin   Type II diabetes with hyperlipidemia: -on semaglutide (GLP1RA), empagliflozin (SGLT2i) and insulin -no aspirin as he is on coumadin   Cardiac risk counseling and prevention recommendations: -recommend heart healthy/Mediterranean diet, with whole grains, fruits, vegetable, fish, lean meats, nuts, and olive oil. Limit salt. -recommend moderate walking, 3-5 times/week for 30-50 minutes each session. Aim for at least 150 minutes.week. Goal should be pace of 3 miles/hours, or walking 1.5 miles in 30 minutes -recommend avoidance of tobacco products. Avoid excess alcohol.  Dispo: Follow-up in 6 months, or sooner as needed.  I,Mathew Stumpf,acting as a Neurosurgeon for [employer], MD.,have documented all relevant documentation on the behalf of Jodelle Red, MD,as directed by  Jodelle Red, MD while in the presence of Jodelle Red, MD.  I, Jodelle Red, MD, have reviewed all documentation for this visit. The documentation on 01/30/23 for the exam, diagnosis, procedures, and orders are all accurate and complete.   Signed, Jodelle Red, MD

## 2023-01-30 NOTE — Patient Instructions (Signed)
Medication Instructions:  Continue current medications  *If you need a refill on your cardiac medications before your next appointment, please call your pharmacy*   Lab Work: None Ordered   Testing/Procedures: None Ordered   Follow-Up: At [facility], you and your health needs are our priority.  As part of our continuing mission to provide you with exceptional heart care, we have created designated Provider Care Teams.  These Care Teams include your primary Cardiologist (physician) and Advanced Practice Providers (APPs -  Physician Assistants and Nurse Practitioners) who all work together to provide you with the care you need, when you need it.  We recommend signing up for the patient portal called "MyChart".  Sign up information is provided on this After Visit Summary.  MyChart is used to connect with patients for Virtual Visits (Telemedicine).  Patients are able to view lab/test results, encounter notes, upcoming appointments, etc.  Non-urgent messages can be sent to your provider as well.   To learn more about what you can do with MyChart, go to [email or web address].    Your next appointment:   6 month(s)  Provider:   Buford Dresser, MD    Other Instructions

## 2023-02-02 DIAGNOSIS — E1169 Type 2 diabetes mellitus with other specified complication: Secondary | ICD-10-CM | POA: Diagnosis not present

## 2023-02-06 ENCOUNTER — Other Ambulatory Visit: Payer: Self-pay | Admitting: Internal Medicine

## 2023-02-10 ENCOUNTER — Ambulatory Visit (INDEPENDENT_AMBULATORY_CARE_PROVIDER_SITE_OTHER): Payer: Medicare HMO

## 2023-02-10 ENCOUNTER — Other Ambulatory Visit: Payer: Self-pay | Admitting: Cardiology

## 2023-02-10 ENCOUNTER — Ambulatory Visit: Payer: Medicare HMO | Admitting: Internal Medicine

## 2023-02-10 DIAGNOSIS — I428 Other cardiomyopathies: Secondary | ICD-10-CM

## 2023-02-10 DIAGNOSIS — I480 Paroxysmal atrial fibrillation: Secondary | ICD-10-CM

## 2023-02-10 NOTE — Telephone Encounter (Addendum)
Prescription refill request received for warfarin Lov:  [name], 01/30/2023 Next INR check: 9/5  Warfarin tablet strength: 5mg 

## 2023-02-11 ENCOUNTER — Encounter: Payer: Self-pay | Admitting: Internal Medicine

## 2023-02-11 ENCOUNTER — Ambulatory Visit: Payer: Medicare HMO | Admitting: Internal Medicine

## 2023-02-11 VITALS — BP 114/80 | HR 61 | Temp 97.8°F | Ht 73.0 in | Wt 259.0 lb

## 2023-02-11 DIAGNOSIS — L97209 Non-pressure chronic ulcer of unspecified calf with unspecified severity: Secondary | ICD-10-CM | POA: Diagnosis not present

## 2023-02-11 DIAGNOSIS — Z23 Encounter for immunization: Secondary | ICD-10-CM | POA: Diagnosis not present

## 2023-02-11 DIAGNOSIS — H6192 Disorder of left external ear, unspecified: Secondary | ICD-10-CM | POA: Diagnosis not present

## 2023-02-11 DIAGNOSIS — E11622 Type 2 diabetes mellitus with other skin ulcer: Secondary | ICD-10-CM | POA: Diagnosis not present

## 2023-02-11 LAB — CUP PACEART REMOTE DEVICE CHECK
Date Time Interrogation Session: 20240827092946
Implantable Lead Connection Status: 753985
Implantable Lead Implant Date: 20231127
Implantable Lead Location: 753860
Implantable Lead Model: 436909
Implantable Lead Serial Number: 81547891
Implantable Pulse Generator Implant Date: 20231127
Pulse Gen Model: 429525
Pulse Gen Serial Number: 84932826

## 2023-02-11 MED ORDER — SILVER SULFADIAZINE 1 % EX CREA: 1.0000 | TOPICAL_CREAM | Freq: Every day | CUTANEOUS | 0 refills | Status: DC

## 2023-02-11 MED ORDER — IBUPROFEN 800 MG PO TABS: 800.0000 mg | ORAL_TABLET | Freq: Three times a day (TID) | ORAL | 2 refills | Status: DC | PRN

## 2023-02-11 NOTE — Patient Instructions (Signed)
We will get you in with the wound clinic to heal this.

## 2023-02-11 NOTE — Progress Notes (Signed)
   Subjective:   Patient ID: [name], male    DOB: Jul 10, 1955, 67 y.o.   MRN: [id number]  HPI The patient is a 67 YO man coming in for diabetic ulcer left calf. Prior 2020 on right calf and took months and wound care to heal. He had ABI assessment then and was normal. Is very painful and no drainage. Not worsening but not healing since this started about 2 months ago.  Review of Systems  Constitutional: Negative.   HENT: Negative.    Eyes: Negative.   Respiratory:  Negative for cough, chest tightness and shortness of breath.   Cardiovascular:  Negative for chest pain, palpitations and leg swelling.  Gastrointestinal:  Negative for abdominal distention, abdominal pain, constipation, diarrhea, nausea and vomiting.  Musculoskeletal: Negative.   Skin:  Positive for wound.  Neurological: Negative.   Psychiatric/Behavioral: Negative.      Objective:  Physical Exam Constitutional:      Appearance: He is well-developed.  HENT:     Head: Normocephalic and atraumatic.  Cardiovascular:     Rate and Rhythm: Normal rate and regular rhythm.  Pulmonary:     Effort: Pulmonary effort is normal. No respiratory distress.     Breath sounds: Normal breath sounds. No wheezing or rales.  Abdominal:     General: Bowel sounds are normal. There is no distension.     Palpations: Abdomen is soft.     Tenderness: There is no abdominal tenderness. There is no rebound.  Musculoskeletal:     Cervical back: Normal range of motion.  Skin:    General: Skin is warm and dry.     Comments: Wound left calf posterior about 4 by 4 cm circular covered in granulation tissue not stageable but appears limited to [facility] tissue  Neurological:     Mental Status: He is alert and oriented to person, place, and time.     Coordination: Coordination normal.     Vitals:   02/11/23 0953  BP: 114/80  Pulse: 61  Temp: 97.8 F (36.6 C)  TempSrc: Oral  SpO2: 98%  Weight: 259 lb (117.5 kg)  Height: 6\' 1"  (1.854 m)     Assessment & Plan:  Flu shot given at visit

## 2023-02-13 ENCOUNTER — Encounter: Payer: Self-pay | Admitting: Internal Medicine

## 2023-02-13 DIAGNOSIS — H6192 Disorder of left external ear, unspecified: Secondary | ICD-10-CM | POA: Insufficient documentation

## 2023-02-13 NOTE — Assessment & Plan Note (Signed)
Not healing and not infected appearing. Needs to go to wound clinic. May need assessment with repeat ABI he wishes to try wound clinic first. Referral done. Diabetes is under good control.

## 2023-02-13 NOTE — Assessment & Plan Note (Signed)
Dark lesion on top of left earlobe concerning and referral to dermatology as I suspect this needs removal/biopsy.

## 2023-02-17 DIAGNOSIS — E11622 Type 2 diabetes mellitus with other skin ulcer: Secondary | ICD-10-CM | POA: Diagnosis not present

## 2023-02-17 DIAGNOSIS — L97209 Non-pressure chronic ulcer of unspecified calf with unspecified severity: Secondary | ICD-10-CM | POA: Diagnosis not present

## 2023-02-17 DIAGNOSIS — H6192 Disorder of left external ear, unspecified: Secondary | ICD-10-CM | POA: Diagnosis not present

## 2023-02-17 DIAGNOSIS — Z23 Encounter for immunization: Secondary | ICD-10-CM | POA: Diagnosis not present

## 2023-02-17 NOTE — Addendum Note (Signed)
Addended by: Levonne Lapping on: 02/17/2023 04:30 PM   Modules accepted: Orders

## 2023-02-19 ENCOUNTER — Ambulatory Visit: Payer: Medicare HMO

## 2023-02-20 NOTE — Progress Notes (Signed)
Remote ICD transmission.   

## 2023-02-23 ENCOUNTER — Ambulatory Visit: Payer: Medicare HMO | Attending: Cardiovascular Disease

## 2023-02-23 DIAGNOSIS — I4891 Unspecified atrial fibrillation: Secondary | ICD-10-CM | POA: Diagnosis not present

## 2023-02-23 DIAGNOSIS — I513 Intracardiac thrombosis, not elsewhere classified: Secondary | ICD-10-CM

## 2023-02-23 DIAGNOSIS — Z5181 Encounter for therapeutic drug level monitoring: Secondary | ICD-10-CM

## 2023-02-23 LAB — POCT INR: INR: 3.4 — AB (ref 2.0–3.0)

## 2023-02-23 NOTE — Patient Instructions (Signed)
Continue on same dosage of Warfarin 1.5 tablets daily except for 1 tablet on Mondays and Wednesdays. Eat greens today Recheck INR in 6 weeks.  Coumadin Clinic [phone].

## 2023-02-27 ENCOUNTER — Other Ambulatory Visit (HOSPITAL_COMMUNITY): Payer: Self-pay | Admitting: Cardiology

## 2023-02-27 DIAGNOSIS — I5022 Chronic systolic (congestive) heart failure: Secondary | ICD-10-CM

## 2023-03-03 ENCOUNTER — Encounter (HOSPITAL_BASED_OUTPATIENT_CLINIC_OR_DEPARTMENT_OTHER): Payer: Medicare HMO | Attending: General Surgery | Admitting: Internal Medicine

## 2023-03-03 DIAGNOSIS — I480 Paroxysmal atrial fibrillation: Secondary | ICD-10-CM | POA: Diagnosis not present

## 2023-03-03 DIAGNOSIS — Z7901 Long term (current) use of anticoagulants: Secondary | ICD-10-CM | POA: Insufficient documentation

## 2023-03-03 DIAGNOSIS — L97828 Non-pressure chronic ulcer of other part of left lower leg with other specified severity: Secondary | ICD-10-CM | POA: Diagnosis not present

## 2023-03-03 DIAGNOSIS — Z8616 Personal history of COVID-19: Secondary | ICD-10-CM | POA: Diagnosis not present

## 2023-03-03 DIAGNOSIS — Z9889 Other specified postprocedural states: Secondary | ICD-10-CM | POA: Diagnosis not present

## 2023-03-03 DIAGNOSIS — I428 Other cardiomyopathies: Secondary | ICD-10-CM | POA: Insufficient documentation

## 2023-03-03 DIAGNOSIS — E039 Hypothyroidism, unspecified: Secondary | ICD-10-CM | POA: Diagnosis not present

## 2023-03-03 DIAGNOSIS — I87332 Chronic venous hypertension (idiopathic) with ulcer and inflammation of left lower extremity: Secondary | ICD-10-CM | POA: Insufficient documentation

## 2023-03-03 DIAGNOSIS — I110 Hypertensive heart disease with heart failure: Secondary | ICD-10-CM | POA: Insufficient documentation

## 2023-03-03 DIAGNOSIS — E11622 Type 2 diabetes mellitus with other skin ulcer: Secondary | ICD-10-CM | POA: Insufficient documentation

## 2023-03-03 DIAGNOSIS — Z950 Presence of cardiac pacemaker: Secondary | ICD-10-CM | POA: Diagnosis not present

## 2023-03-03 DIAGNOSIS — I5040 Unspecified combined systolic (congestive) and diastolic (congestive) heart failure: Secondary | ICD-10-CM | POA: Diagnosis not present

## 2023-03-03 DIAGNOSIS — G4730 Sleep apnea, unspecified: Secondary | ICD-10-CM | POA: Insufficient documentation

## 2023-03-03 DIAGNOSIS — I872 Venous insufficiency (chronic) (peripheral): Secondary | ICD-10-CM | POA: Insufficient documentation

## 2023-03-03 DIAGNOSIS — W11XXXA Fall on and from ladder, initial encounter: Secondary | ICD-10-CM | POA: Diagnosis not present

## 2023-03-03 DIAGNOSIS — L97222 Non-pressure chronic ulcer of left calf with fat layer exposed: Secondary | ICD-10-CM | POA: Diagnosis not present

## 2023-03-03 DIAGNOSIS — J45909 Unspecified asthma, uncomplicated: Secondary | ICD-10-CM | POA: Diagnosis not present

## 2023-03-03 DIAGNOSIS — W2203XA Walked into furniture, initial encounter: Secondary | ICD-10-CM | POA: Insufficient documentation

## 2023-03-04 ENCOUNTER — Encounter (HOSPITAL_BASED_OUTPATIENT_CLINIC_OR_DEPARTMENT_OTHER): Payer: Medicare HMO | Admitting: General Surgery

## 2023-03-04 ENCOUNTER — Encounter: Payer: Self-pay | Admitting: Internal Medicine

## 2023-03-04 MED ORDER — GABAPENTIN 300 MG PO CAPS: 300.0000 mg | ORAL_CAPSULE | Freq: Two times a day (BID) | ORAL | 2 refills | Status: DC

## 2023-03-04 NOTE — Progress Notes (Signed)
[name], [name] ([id number]) [phone] [id number] Page 1 of 4 Visit Report for 03/03/2023 Abuse Risk Screen Details Patient Name: Date of Service: [name], [name] 03/03/2023 10:30 A M Medical Record Number: [id number] Patient Account Number: [id number] Date of Birth/Sex: Treating RN: [birth year] (67 y.o. [name] Primary Care Tattiana Fakhouri: Hillard Danker Other Clinician: Referring Korena Nass: Treating Khushi Zupko/Extender: Shara Blazing in Treatment: 0 Abuse Risk Screen Items Answer ABUSE RISK SCREEN: Has anyone close to you tried to hurt or harm you recentlyo No Do you feel uncomfortable with anyone in your familyo No Has anyone forced you do things that you didnt want to doo No Electronic Signature(s) Signed: 03/03/2023 5:20:06 PM By: Redmond Pulling RN, BSN Entered By: Redmond Pulling on 03/03/2023 10:59:07 -------------------------------------------------------------------------------- Activities of Daily Living Details Patient Name: Date of Service: [name], [name] 03/03/2023 10:30 A M Medical Record Number: [id number] Patient Account Number: [id number] Date of Birth/Sex: Treating RN: May 03, 1956 (67 y.o. [name] Primary Care Torrey Horseman: Hillard Danker Other Clinician: Referring Macarthur Lorusso: Treating Gaile Allmon/Extender: Shara Blazing in Treatment: 0 Activities of Daily Living Items Answer Activities of Daily Living (Please select one for each item) Drive Automobile Completely Able T Medications ake Completely Able Use T elephone Completely Able Care for Appearance Completely Able Use T oilet Completely Able Bath / Shower Completely Able Dress Self Completely Able Feed Self Completely Able Walk Completely Able Get In / Out Bed Completely Able Housework Completely Able Prepare Meals Completely Able Handle Money Completely Able Shop for Self Completely Able Electronic  Signature(s) Signed: 03/03/2023 5:20:06 PM By: Redmond Pulling RN, BSN Entered By: Redmond Pulling on 03/03/2023 10:59:30 [name] ([id number]) [id number] Page 2 of 4 -------------------------------------------------------------------------------- Education Screening Details Patient Name: Date of Service: [name], [name] 03/03/2023 10:30 A M Medical Record Number: [id number] Patient Account Number: [id number] Date of Birth/Sex: Treating RN: 1955-07-08 (67 y.o. [name] Primary Care Maikel Neisler: Hillard Danker Other Clinician: Referring Colette Dicamillo: Treating Tyna Huertas/Extender: Shara Blazing in Treatment: 0 Primary Learner Assessed: Patient Learning Preferences/Education Level/Primary Language Learning Preference: Explanation, Demonstration, Printed Material Preferred Language: English Cognitive Barrier Language Barrier: No Translator Needed: No Memory Deficit: No Emotional Barrier: No Cultural/Religious Beliefs Affecting Medical Care: No Physical Barrier Impaired Vision: No Impaired Hearing: No Decreased Hand dexterity: No Knowledge/Comprehension Knowledge Level: High Comprehension Level: High Ability to understand written instructions: High Ability to understand verbal instructions: High Motivation Anxiety Level: Calm Cooperation: Cooperative Education Importance: Acknowledges Need Interest in Health Problems: Asks Questions Perception: Coherent Willingness to Engage in Self-Management High Activities: Readiness to Engage in Self-Management High Activities: Electronic Signature(s) Signed: 03/03/2023 5:20:06 PM By: Redmond Pulling RN, BSN Entered By: Redmond Pulling on 03/03/2023 10:59:55 -------------------------------------------------------------------------------- Fall Risk Assessment Details Patient Name: Date of Service: [name], [name] 03/03/2023 10:30 A M Medical Record Number:  [id number] Patient Account Number: [id number] Date of Birth/Sex: Treating RN: [birth year] (67 y.o. [name] Primary Care Averi Cacioppo: Hillard Danker Other Clinician: Referring Anael Rosch: Treating Brydon Spahr/Extender: Shara Blazing in Treatment: 0 Fall Risk Assessment Items Have you had 2 or more falls in the last 12 monthso 0 No Have you had any fall that resulted in injury in the last 12 monthso 0 No Sant, [name] ([id number]) [id number] Page 3 of 4 FALLS RISK SCREEN History of falling - immediate or within 3 months 0 No Secondary diagnosis (Do you have 2 or more medical diagnoseso) 0 No Ambulatory aid None/bed rest/wheelchair/nurse  0 Yes Crutches/cane/walker 0 No Furniture 0 No Intravenous therapy Access/Saline/Heparin Lock 0 No Gait/Transferring Normal/ bed rest/ wheelchair 0 Yes Weak (short steps with or without shuffle, stooped but able to lift head while walking, may seek 0 No support from furniture) Impaired (short steps with shuffle, may have difficulty arising from chair, head down, impaired 0 No balance) Mental Status Oriented to own ability 0 Yes Electronic Signature(s) Signed: 03/03/2023 5:20:06 PM By: Redmond Pulling RN, BSN Entered By: Redmond Pulling on 03/03/2023 11:00:08 -------------------------------------------------------------------------------- Foot Assessment Details Patient Name: Date of Service: [name] NDY J. 03/03/2023 10:30 A M Medical Record Number: [id number] Patient Account Number: [id number] Date of Birth/Sex: Treating RN: 27-Sep-1955 (67 y.o. [name] Primary Care Netty Sullivant: Hillard Danker Other Clinician: Referring Keishaun Hazel: Treating Daryel Kenneth/Extender: Shara Blazing in Treatment: 0 Foot Assessment Items Site Locations + = Sensation present, - = Sensation absent, C = Callus, U = Ulcer R = Redness, W = Warmth, M = Maceration, PU =  Pre-ulcerative lesion F = Fissure, S = Swelling, D = Dryness Assessment Right: Left: Other Deformity: No No Prior Foot Ulcer: No No Prior Amputation: No No Charcot Joint: No No Ambulatory Status: Ambulatory Without Help GaitKEDERICK, [name] ([id number]) [phone] [id number] Page 4 of 4 Electronic Signature(s) Signed: 03/03/2023 5:20:06 PM By: Redmond Pulling RN, BSN Entered By: Redmond Pulling on 03/03/2023 11:02:40 -------------------------------------------------------------------------------- Nutrition Risk Screening Details Patient Name: Date of Service: CHUEYEE, KOEPPEL 03/03/2023 10:30 A M Medical Record Number: [id number] Patient Account Number: [id number] Date of Birth/Sex: Treating RN: 10-Aug-1955 (67 y.o. [name] Primary Care Elwanda Moger: Hillard Danker Other Clinician: Referring Caleb Decock: Treating Katrine Radich/Extender: Shara Blazing in Treatment: 0 Height (in): Weight (lbs): Body Mass Index (BMI): Nutrition Risk Screening Items Score Screening NUTRITION RISK SCREEN: I have an illness or condition that made me change the kind and/or amount of food I eat 0 No I eat fewer than two meals per day 0 No I eat few fruits and vegetables, or milk products 0 No I have three or more drinks of beer, liquor or wine almost every day 0 No I have tooth or mouth problems that make it hard for me to eat 0 No I don't always have enough money to buy the food I need 0 No I eat alone most of the time 0 No I take three or more different prescribed or over-the-counter drugs a day 1 Yes Without wanting to, I have lost or gained 10 pounds in the last six months 0 No I am not always physically able to shop, cook and/or feed myself 0 No Nutrition Protocols Good Risk Protocol Moderate Risk Protocol High Risk Proctocol Risk Level: Good Risk Score: 1 Electronic Signature(s) Signed: 03/03/2023 5:20:06 PM By: Redmond Pulling RN,  BSN Entered By: Redmond Pulling on 03/03/2023 11:00:34

## 2023-03-04 NOTE — Progress Notes (Signed)
Name: Date of Service: [name], [name] 03/03/2023 10:30 A M Medical Record Number: [id number] Patient Account Number:  [id number] Date of Birth/Sex: Treating RN: Jul 31, 1955 (67 y.o. [name] Primary Care Solaris [name]: [name] Other Clinician: Referring [name]: Treating [name]/Extender: [name], [name] ([id number]) [id number] Page 7 of 9 Weeks in Treatment: 0 Active Problems Location of Pain Severity and Description of Pain Patient Has Paino Yes Site Locations Duration of the Pain. Constant / Intermittento Intermittent Rate the pain. Current Pain Level: 0 Worst Pain Level: 9 Character of Pain Describe the Pain: Stabbing, Throbbing Pain Management and Medication Current Pain Management: How does your wound impact your activities of daily livingo Sleep: Yes Electronic Signature(s) Signed: 03/03/2023 5:20:06 PM By: Redmond Pulling RN, BSN Entered By: Redmond Pulling on 03/03/2023 07:49:59 -------------------------------------------------------------------------------- Patient/Caregiver Education Details Patient Name: Date of Service: [name] 9/17/2024andnbsp10:30 A M Medical Record Number: [id number] Patient Account Number: [id number] Date of Birth/Gender: Treating RN: 11-04-1955 (67 y.o. [name] Primary Care Physician: [name] Other Clinician: Referring Physician: Treating Physician/Extender: Shara Blazing in Treatment: 0 Education Assessment Education Provided To: Patient Education Topics Provided Wound/Skin Impairment: Methods: Explain/Verbal Responses: State content correctly Electronic Signature(s) Signed: 03/03/2023 5:20:06 PM By: Redmond Pulling RN, BSN Entered By: Redmond Pulling on 03/03/2023 08:59:58 [name] ([id number]) [id number] Page 8 of 9 -------------------------------------------------------------------------------- Wound Assessment Details Patient Name: Date of Service: [name], [name] 03/03/2023  10:30 A M Medical Record Number: [id number] Patient Account Number: [id number] Date of Birth/Sex: Treating RN: 26-Jul-1955 (67 y.o. [name] Primary Care [name]: [name] Other Clinician: Referring [name]: Treating [name]/Extender: Shara Blazing in Treatment: 0 Wound Status Wound Number: 13 Primary Venous Leg Ulcer Etiology: Wound Location: Left, Posterior Lower Leg Secondary Diabetic Wound/Ulcer of the Lower Extremity Wounding Event: Blister Etiology: Date Acquired: 01/20/2023 Wound Open Weeks Of Treatment: 0 Status: Clustered Wound: No Comorbid Asthma, Sleep Apnea, Arrhythmia, Congestive Heart Failure, History: Hypertension, Type II Diabetes Photos Wound Measurements Length: (cm) 5.5 Width: (cm) 4.5 Depth: (cm) 0.2 Area: (cm) 19.439 Volume: (cm) 3.888 % Reduction in Area: % Reduction in Volume: Epithelialization: None Tunneling: No Undermining: No Wound Description Classification: Full Thickness Without Exposed Suppor Exudate Amount: Medium Exudate Type: Serosanguineous Exudate Color: red, brown t Structures Foul Odor After Cleansing: No Slough/Fibrino Yes Wound Bed Granulation Amount: Large (67-100%) Exposed Structure Granulation Quality: Red Fascia Exposed: No Necrotic Amount: Small (1-33%) Fat Layer (Subcutaneous Tissue) Exposed: Yes Necrotic Quality: Eschar, Adherent Slough Tendon Exposed: No Muscle Exposed: No Joint Exposed: No Bone Exposed: No Periwound Skin Texture Texture Color No Abnormalities Noted: No No Abnormalities Noted: No Hemosiderin Staining: Yes Moisture No Abnormalities Noted: No Dry / Scaly: Yes Treatment Notes Wound #13 (Lower Leg) Wound Laterality: Left, Posterior [name], [name] ([id number]) [id number] Page 9 of 9 Cleanser Soap and Water Discharge Instruction: May shower and wash wound with dial antibacterial soap and water prior to dressing  change. Vashe 5.8 (oz) Discharge Instruction: Cleanse the wound with Vashe prior to applying a clean dressing using gauze sponges, not tissue or cotton balls. Peri-Wound Care Sween Lotion (Moisturizing lotion) Discharge Instruction: Apply moisturizing lotion as directed Topical Primary Dressing Hydrofera Blue Ready Transfer Foam, 4x5 (in/in) Discharge Instruction: Apply to wound bed as instructed Secondary Dressing ABD Pad, 8x10 Discharge Instruction: Apply over primary dressing as directed. Secured With Compression Wrap Urgo K2, (equivalent to a 4 layer) two layer compression system, regular Discharge Instruction: Apply  [name] ([id number]) [id number] Page 1 of 9 Visit Report for 03/03/2023 Allergy List Details Patient Name: Date of Service: [name], [name] 03/03/2023 10:30 A M Medical Record Number: [id number] Patient Account Number: [id number] Date of Birth/Sex: Treating RN: [birth year] (67 y.o. [name] Primary Care [name]: [name] Other Clinician: Referring [name]: Treating [name]/Extender: Shara Blazing in Treatment: 0 Allergies Active Allergies No Known Allergies Allergy Notes Electronic Signature(s) Signed: 03/03/2023 5:20:06 PM By: Redmond Pulling RN, BSN Entered By: Redmond Pulling on 03/03/2023 07:54:04 -------------------------------------------------------------------------------- Arrival Information Details Patient Name: Date of Service: [name] Asper NDY J. 03/03/2023 10:30 A M Medical Record Number: [id number] Patient Account Number: [id number] Date of Birth/Sex: Treating RN: Jun 29, 1955 (67 y.o. [name] Primary Care [name]: [name] Other Clinician: Referring [name]: Treating [name]/Extender: Shara Blazing in Treatment: 0 Visit Information Patient Arrived: Ambulatory Arrival Time: 10:46 Accompanied By: self Transfer Assistance: None Patient Identification Verified: Yes Secondary Verification Process Completed: Yes Patient Has Alerts: Yes Patient Alerts: Patient on Blood Thinner History Since Last Visit Added or deleted any medications: Yes Any new allergies or adverse reactions: No Had a fall or experienced change in activities of daily living that may affect risk of falls: No Signs or symptoms of abuse/neglect since last visito No Hospitalized since last visit: No Implantable device outside of the clinic excluding cellular tissue based products placed in the center since last visit: No Has Dressing in Place as Prescribed: No Pain  Present Now: Yes Electronic Signature(s) Signed: 03/03/2023 5:20:06 PM By: Redmond Pulling RN, BSN Entered By: Redmond Pulling on 03/03/2023 07:48:39 [name] ([id number]) [id number] Page 2 of 9 -------------------------------------------------------------------------------- Clinic Level of Care Assessment Details Patient Name: Date of Service: [name], [name] 03/03/2023 10:30 A M Medical Record Number: [id number] Patient Account Number: [id number] Date of Birth/Sex: Treating RN: Oct 18, 1955 (67 y.o. [name] Primary Care Mina Babula: [name] Other Clinician: Referring Alvie Speltz: Treating Seanna Sisler/Extender: Shara Blazing in Treatment: 0 Clinic Level of Care Assessment Items TOOL 1 Quantity Score X- 1 0 Use when EandM and Procedure is performed on INITIAL visit ASSESSMENTS - Nursing Assessment / Reassessment X- 1 20 General Physical Exam (combine w/ comprehensive assessment (listed just below) when performed on new pt. evals) X- 1 25 Comprehensive Assessment (HX, ROS, Risk Assessments, Wounds Hx, etc.) ASSESSMENTS - Wound and Skin Assessment / Reassessment []  - 0 Dermatologic / Skin Assessment (not related to wound area) ASSESSMENTS - Ostomy and/or Continence Assessment and Care []  - 0 Incontinence Assessment and Management []  - 0 Ostomy Care Assessment and Management (repouching, etc.) PROCESS - Coordination of Care X - Simple Patient / Family Education for ongoing care 1 15 []  - 0 Complex (extensive) Patient / Family Education for ongoing care X- 1 10 Staff obtains Chiropractor, Records, T Results / Process Orders est []  - 0 Staff telephones HHA, Nursing Homes / Clarify orders / etc []  - 0 Routine Transfer to another Facility (non-emergent condition) []  - 0 Routine Hospital Admission (non-emergent condition) []  - 0 New Admissions / Manufacturing engineer / Ordering NPWT Apligraf, etc. , []  -  0 Emergency Hospital Admission (emergent condition) PROCESS - Special Needs []  - 0 Pediatric / Minor Patient Management []  - 0 Isolation Patient Management []  - 0 Hearing / Language / Visual special needs []  - 0 Assessment of Community assistance (transportation, D/C planning, etc.) []  - 0 Additional assistance / Altered mentation []  - 0 Support Surface(s) Assessment (  Name: Date of Service: [name], [name] 03/03/2023 10:30 A M Medical Record Number: [id number] Patient Account Number:  [id number] Date of Birth/Sex: Treating RN: Jul 31, 1955 (67 y.o. [name] Primary Care Solaris [name]: [name] Other Clinician: Referring [name]: Treating [name]/Extender: [name], [name] ([id number]) [id number] Page 7 of 9 Weeks in Treatment: 0 Active Problems Location of Pain Severity and Description of Pain Patient Has Paino Yes Site Locations Duration of the Pain. Constant / Intermittento Intermittent Rate the pain. Current Pain Level: 0 Worst Pain Level: 9 Character of Pain Describe the Pain: Stabbing, Throbbing Pain Management and Medication Current Pain Management: How does your wound impact your activities of daily livingo Sleep: Yes Electronic Signature(s) Signed: 03/03/2023 5:20:06 PM By: Redmond Pulling RN, BSN Entered By: Redmond Pulling on 03/03/2023 07:49:59 -------------------------------------------------------------------------------- Patient/Caregiver Education Details Patient Name: Date of Service: [name] 9/17/2024andnbsp10:30 A M Medical Record Number: [id number] Patient Account Number: [id number] Date of Birth/Gender: Treating RN: 11-04-1955 (67 y.o. [name] Primary Care Physician: [name] Other Clinician: Referring Physician: Treating Physician/Extender: Shara Blazing in Treatment: 0 Education Assessment Education Provided To: Patient Education Topics Provided Wound/Skin Impairment: Methods: Explain/Verbal Responses: State content correctly Electronic Signature(s) Signed: 03/03/2023 5:20:06 PM By: Redmond Pulling RN, BSN Entered By: Redmond Pulling on 03/03/2023 08:59:58 [name] ([id number]) [id number] Page 8 of 9 -------------------------------------------------------------------------------- Wound Assessment Details Patient Name: Date of Service: [name], [name] 03/03/2023  10:30 A M Medical Record Number: [id number] Patient Account Number: [id number] Date of Birth/Sex: Treating RN: 26-Jul-1955 (67 y.o. [name] Primary Care [name]: [name] Other Clinician: Referring [name]: Treating [name]/Extender: Shara Blazing in Treatment: 0 Wound Status Wound Number: 13 Primary Venous Leg Ulcer Etiology: Wound Location: Left, Posterior Lower Leg Secondary Diabetic Wound/Ulcer of the Lower Extremity Wounding Event: Blister Etiology: Date Acquired: 01/20/2023 Wound Open Weeks Of Treatment: 0 Status: Clustered Wound: No Comorbid Asthma, Sleep Apnea, Arrhythmia, Congestive Heart Failure, History: Hypertension, Type II Diabetes Photos Wound Measurements Length: (cm) 5.5 Width: (cm) 4.5 Depth: (cm) 0.2 Area: (cm) 19.439 Volume: (cm) 3.888 % Reduction in Area: % Reduction in Volume: Epithelialization: None Tunneling: No Undermining: No Wound Description Classification: Full Thickness Without Exposed Suppor Exudate Amount: Medium Exudate Type: Serosanguineous Exudate Color: red, brown t Structures Foul Odor After Cleansing: No Slough/Fibrino Yes Wound Bed Granulation Amount: Large (67-100%) Exposed Structure Granulation Quality: Red Fascia Exposed: No Necrotic Amount: Small (1-33%) Fat Layer (Subcutaneous Tissue) Exposed: Yes Necrotic Quality: Eschar, Adherent Slough Tendon Exposed: No Muscle Exposed: No Joint Exposed: No Bone Exposed: No Periwound Skin Texture Texture Color No Abnormalities Noted: No No Abnormalities Noted: No Hemosiderin Staining: Yes Moisture No Abnormalities Noted: No Dry / Scaly: Yes Treatment Notes Wound #13 (Lower Leg) Wound Laterality: Left, Posterior [name], [name] ([id number]) [id number] Page 9 of 9 Cleanser Soap and Water Discharge Instruction: May shower and wash wound with dial antibacterial soap and water prior to dressing  change. Vashe 5.8 (oz) Discharge Instruction: Cleanse the wound with Vashe prior to applying a clean dressing using gauze sponges, not tissue or cotton balls. Peri-Wound Care Sween Lotion (Moisturizing lotion) Discharge Instruction: Apply moisturizing lotion as directed Topical Primary Dressing Hydrofera Blue Ready Transfer Foam, 4x5 (in/in) Discharge Instruction: Apply to wound bed as instructed Secondary Dressing ABD Pad, 8x10 Discharge Instruction: Apply over primary dressing as directed. Secured With Compression Wrap Urgo K2, (equivalent to a 4 layer) two layer compression system, regular Discharge Instruction: Apply  Respiratory Rate(breaths/min): 18 [13:Photos:] [N/A:N/A] Left, Posterior Lower Leg N/A N/A Wound Location: Blister N/A N/A Wounding Event: Venous Leg Ulcer N/A N/A Primary Etiology: Diabetic Wound/Ulcer of the Lower N/A N/A Secondary Etiology: Extremity Asthma, Sleep Apnea, Arrhythmia, N/A N/A Comorbid History: Congestive Heart Failure, Hypertension, Type II Diabetes 01/20/2023 N/A N/A Date Acquired: 0 N/A N/A Weeks of Treatment: Open N/A N/A Wound Status: No N/A N/A Wound Recurrence: 5.5x4.5x0.2 N/A N/A Measurements L x W x D (cm) 19.439 N/A N/A A (cm) : rea 3.888 N/A N/A Volume (cm) : Full Thickness Without Exposed N/A N/A Classification: Support Structures Medium N/A N/A Exudate A mount: Serosanguineous N/A N/A Exudate  Type: red, brown N/A N/A Exudate Color: Large (67-100%) N/A N/A Granulation A mount: Red N/A N/A Granulation Quality: Small (1-33%) N/A N/A Necrotic A mount: Eschar, Adherent Slough N/A N/A Necrotic Tissue: Fat Layer (Subcutaneous Tissue): Yes N/A N/A Exposed Structures: Fascia: No Tendon: No Muscle: No Joint: No Bone: No None N/A N/A Epithelialization: Debridement - Excisional N/A N/A Debridement: Pre-procedure Verification/Time Out 11:25 N/A N/A Taken: Lidocaine 5% topical ointment N/A N/A Pain Control: Necrotic/Eschar, Subcutaneous, N/A N/A Tissue Debrided: Slough Skin/Subcutaneous Tissue N/A N/A Level: 19.43 N/A N/A Debridement A (sq cm): rea Curette N/A N/A Instrument: Minimum N/A N/A Bleeding: Pressure N/A N/A Hemostasis Achieved: Debridement Treatment Response: Procedure was tolerated well N/A N/A Post Debridement Measurements L x 5.5x4.5x0.2 N/A N/A W x D (cm) 3.888 N/A N/A Post Debridement Volume: (cm) Dry/Scaly: Yes N/A N/A Periwound Skin Moisture: Hemosiderin Staining: Yes N/A N/A Periwound Skin Color: Compression Therapy N/A N/A Procedures Performed: Debridement Treatment Notes Wound #13 (Lower Leg) Wound Laterality: Left, Posterior Cleanser Soap and Water Discharge Instruction: May shower and wash wound with dial antibacterial soap and water prior to dressing change. Vashe 5.8 (oz) Discharge Instruction: Cleanse the wound with Vashe prior to applying a clean dressing using gauze sponges, not tissue or cotton balls. Peri-Wound Care Sween Lotion (Moisturizing lotion) Discharge Instruction: Apply moisturizing lotion as directed Topical Primary Dressing Hydrofera Blue Ready Transfer Foam, 4x5 (in/in) Discharge Instruction: Apply to wound bed as instructed Secondary Dressing ABD Pad, 8x10 Discharge Instruction: Apply over primary dressing as directed. Secured With Nellie, [name] ([id number]) [id number] Page 6 of  9 Compression Wrap Urgo K2, (equivalent to a 4 layer) two layer compression system, regular Discharge Instruction: Apply Urgo K2 as directed (alternative to 4 layer compression). Compression Stockings Add-Ons Electronic Signature(s) Signed: 03/03/2023 4:34:08 PM By: Baltazar Najjar MD Entered By: Baltazar Najjar on 03/03/2023 09:18:05 -------------------------------------------------------------------------------- Multi-Disciplinary Care Plan Details Patient Name: Date of Service: [name] Asper NDY J. 03/03/2023 10:30 A M Medical Record Number: [id number] Patient Account Number: [id number] Date of Birth/Sex: Treating RN: 05-03-1956 (67 y.o. [name] Primary Care Jayni Prescher: [name] Other Clinician: Referring Keyanna Sandefer: Treating Ritesh Opara/Extender: Shara Blazing in Treatment: 0 Active Inactive Wound/Skin Impairment Nursing Diagnoses: Impaired tissue integrity Knowledge deficit related to ulceration/compromised skin integrity Goals: Patient/caregiver will verbalize understanding of skin care regimen Date Initiated: 03/03/2023 Target Resolution Date: 05/15/2023 Goal Status: Active Ulcer/skin breakdown will have a volume reduction of 30% by week 4 Date Initiated: 03/03/2023 Target Resolution Date: 03/31/2023 Goal Status: Active Interventions: Assess patient/caregiver ability to obtain necessary supplies Assess patient/caregiver ability to perform ulcer/skin care regimen upon admission and as needed Assess ulceration(s) every visit Provide education on ulcer and skin care Notes: Electronic Signature(s) Signed: 03/03/2023 5:20:06 PM By: Redmond Pulling RN, BSN Entered By: Redmond Pulling on 03/03/2023 08:59:48 -------------------------------------------------------------------------------- Pain Assessment Details Patient  Respiratory Rate(breaths/min): 18 [13:Photos:] [N/A:N/A] Left, Posterior Lower Leg N/A N/A Wound Location: Blister N/A N/A Wounding Event: Venous Leg Ulcer N/A N/A Primary Etiology: Diabetic Wound/Ulcer of the Lower N/A N/A Secondary Etiology: Extremity Asthma, Sleep Apnea, Arrhythmia, N/A N/A Comorbid History: Congestive Heart Failure, Hypertension, Type II Diabetes 01/20/2023 N/A N/A Date Acquired: 0 N/A N/A Weeks of Treatment: Open N/A N/A Wound Status: No N/A N/A Wound Recurrence: 5.5x4.5x0.2 N/A N/A Measurements L x W x D (cm) 19.439 N/A N/A A (cm) : rea 3.888 N/A N/A Volume (cm) : Full Thickness Without Exposed N/A N/A Classification: Support Structures Medium N/A N/A Exudate A mount: Serosanguineous N/A N/A Exudate  Type: red, brown N/A N/A Exudate Color: Large (67-100%) N/A N/A Granulation A mount: Red N/A N/A Granulation Quality: Small (1-33%) N/A N/A Necrotic A mount: Eschar, Adherent Slough N/A N/A Necrotic Tissue: Fat Layer (Subcutaneous Tissue): Yes N/A N/A Exposed Structures: Fascia: No Tendon: No Muscle: No Joint: No Bone: No None N/A N/A Epithelialization: Debridement - Excisional N/A N/A Debridement: Pre-procedure Verification/Time Out 11:25 N/A N/A Taken: Lidocaine 5% topical ointment N/A N/A Pain Control: Necrotic/Eschar, Subcutaneous, N/A N/A Tissue Debrided: Slough Skin/Subcutaneous Tissue N/A N/A Level: 19.43 N/A N/A Debridement A (sq cm): rea Curette N/A N/A Instrument: Minimum N/A N/A Bleeding: Pressure N/A N/A Hemostasis Achieved: Debridement Treatment Response: Procedure was tolerated well N/A N/A Post Debridement Measurements L x 5.5x4.5x0.2 N/A N/A W x D (cm) 3.888 N/A N/A Post Debridement Volume: (cm) Dry/Scaly: Yes N/A N/A Periwound Skin Moisture: Hemosiderin Staining: Yes N/A N/A Periwound Skin Color: Compression Therapy N/A N/A Procedures Performed: Debridement Treatment Notes Wound #13 (Lower Leg) Wound Laterality: Left, Posterior Cleanser Soap and Water Discharge Instruction: May shower and wash wound with dial antibacterial soap and water prior to dressing change. Vashe 5.8 (oz) Discharge Instruction: Cleanse the wound with Vashe prior to applying a clean dressing using gauze sponges, not tissue or cotton balls. Peri-Wound Care Sween Lotion (Moisturizing lotion) Discharge Instruction: Apply moisturizing lotion as directed Topical Primary Dressing Hydrofera Blue Ready Transfer Foam, 4x5 (in/in) Discharge Instruction: Apply to wound bed as instructed Secondary Dressing ABD Pad, 8x10 Discharge Instruction: Apply over primary dressing as directed. Secured With Nellie, [name] ([id number]) [id number] Page 6 of  9 Compression Wrap Urgo K2, (equivalent to a 4 layer) two layer compression system, regular Discharge Instruction: Apply Urgo K2 as directed (alternative to 4 layer compression). Compression Stockings Add-Ons Electronic Signature(s) Signed: 03/03/2023 4:34:08 PM By: Baltazar Najjar MD Entered By: Baltazar Najjar on 03/03/2023 09:18:05 -------------------------------------------------------------------------------- Multi-Disciplinary Care Plan Details Patient Name: Date of Service: [name] Asper NDY J. 03/03/2023 10:30 A M Medical Record Number: [id number] Patient Account Number: [id number] Date of Birth/Sex: Treating RN: 05-03-1956 (67 y.o. [name] Primary Care Jayni Prescher: [name] Other Clinician: Referring Keyanna Sandefer: Treating Ritesh Opara/Extender: Shara Blazing in Treatment: 0 Active Inactive Wound/Skin Impairment Nursing Diagnoses: Impaired tissue integrity Knowledge deficit related to ulceration/compromised skin integrity Goals: Patient/caregiver will verbalize understanding of skin care regimen Date Initiated: 03/03/2023 Target Resolution Date: 05/15/2023 Goal Status: Active Ulcer/skin breakdown will have a volume reduction of 30% by week 4 Date Initiated: 03/03/2023 Target Resolution Date: 03/31/2023 Goal Status: Active Interventions: Assess patient/caregiver ability to obtain necessary supplies Assess patient/caregiver ability to perform ulcer/skin care regimen upon admission and as needed Assess ulceration(s) every visit Provide education on ulcer and skin care Notes: Electronic Signature(s) Signed: 03/03/2023 5:20:06 PM By: Redmond Pulling RN, BSN Entered By: Redmond Pulling on 03/03/2023 08:59:48 -------------------------------------------------------------------------------- Pain Assessment Details Patient

## 2023-03-04 NOTE — Progress Notes (Signed)
Physician Orders Details Patient Name: Date of Service: [name], [name] 03/03/2023 10:30 A M Medical Record Number: [id number] Patient Account Number: [id number] Date of Birth/Sex: Treating RN: 04-19-1956 (67 y.o. [name]: Hillard Danker Other Clinician: Referring [name]: Treating [name]/Extender: Shara Blazing in Treatment: 0 Verbal / Phone Orders: No Diagnosis Coding Follow-up Appointments ppointment in 1 week. - Dr Leanord Hawking Return A Anesthetic Wound #13 Left,Posterior Lower Leg (In clinic) Topical Lidocaine 5% applied to wound bed [name], [name] ([id number]) [id number] Page 5 of 13 Bathing/ Shower/ Hygiene May shower with protection but do not get wound dressing(s) wet. Protect dressing(s) with water repellant cover (for example, large plastic bag) or a cast cover and may then take shower. Edema Control - Lymphedema / SCD / Other Left Lower Extremity Elevate legs to the level of the heart or above for 30 minutes daily and/or when sitting for 3-4 times a day throughout the day. Avoid standing for long periods of time. Exercise regularly - walking is good for circulation Wound Treatment Wound #13 - Lower Leg Wound Laterality: Left, Posterior Cleanser: Soap and Water 1 x Per Week/15 Days Discharge Instructions: May shower and wash wound with dial antibacterial soap and  water prior to dressing change. Cleanser: Vashe 5.8 (oz) 1 x Per Week/15 Days Discharge Instructions: Cleanse the wound with Vashe prior to applying a clean dressing using gauze sponges, not tissue or cotton balls. Peri-Wound Care: Sween Lotion (Moisturizing lotion) 1 x Per Week/15 Days Discharge Instructions: Apply moisturizing lotion as directed Prim Dressing: Hydrofera Blue Ready Transfer Foam, 4x5 (in/in) 1 x Per Week/15 Days ary Discharge Instructions: Apply to wound bed as instructed Secondary Dressing: ABD Pad, 8x10 1 x Per Week/15 Days Discharge Instructions: Apply over primary dressing as directed. Compression Wrap: Urgo K2, (equivalent to a 4 layer) two layer compression system, regular 1 x Per Week/15 Days Discharge Instructions: Apply Urgo K2 as directed (alternative to 4 layer compression). Patient Medications llergies: No Known Allergies A Notifications Medication Indication Start End 03/03/2023 lidocaine DOSE topical 5 % ointment - ointment topical once daily Electronic Signature(s) Signed: 03/03/2023 4:34:08 PM By: Baltazar Najjar MD Signed: 03/03/2023 5:20:06 PM By: Redmond Pulling RN, BSN Entered By: Redmond Pulling on 03/03/2023 11:35:36 -------------------------------------------------------------------------------- Problem List Details Patient Name: Date of Service: [name] Asper NDY J. 03/03/2023 10:30 A M Medical Record Number: [id number] Patient Account Number: [id number] Date of Birth/Sex: Treating RN: [birth year] (67 y.o. 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 03/03/2023 No Yes severity I87.332 Chronic venous hypertension (idiopathic) with ulcer and inflammation of left  03/03/2023 No Yes lower extremity [name], [name] ([id number]) [id number] Page 6 of 13 E11.622 Type 2 diabetes mellitus with other skin ulcer 03/03/2023 No Yes Inactive Problems Resolved Problems Electronic Signature(s) Signed: 03/03/2023 4:34:08 PM By: Baltazar Najjar MD Entered By: Baltazar Najjar on 03/03/2023 12:28:56 -------------------------------------------------------------------------------- Progress Note Details Patient Name: Date of Service: [name] Asper NDY J. 03/03/2023 10:30 A M Medical Record Number: [id number] Patient Account Number: [id number] Date of Birth/Sex: Treating RN: 11/27/1955 (67 y.o. M) Primary Care [name]: Hillard Danker Other Clinician: Referring [name]: Treating [name]/Extender: Shara Blazing in Treatment: 0 Subjective Chief Complaint Information obtained from Patient 07/29/2018; patient is here for review of a fairly sizable wound  Physician Orders Details Patient Name: Date of Service: [name], [name] 03/03/2023 10:30 A M Medical Record Number: [id number] Patient Account Number: [id number] Date of Birth/Sex: Treating RN: 04-19-1956 (67 y.o. [name]: Hillard Danker Other Clinician: Referring [name]: Treating [name]/Extender: Shara Blazing in Treatment: 0 Verbal / Phone Orders: No Diagnosis Coding Follow-up Appointments ppointment in 1 week. - Dr Leanord Hawking Return A Anesthetic Wound #13 Left,Posterior Lower Leg (In clinic) Topical Lidocaine 5% applied to wound bed [name], [name] ([id number]) [id number] Page 5 of 13 Bathing/ Shower/ Hygiene May shower with protection but do not get wound dressing(s) wet. Protect dressing(s) with water repellant cover (for example, large plastic bag) or a cast cover and may then take shower. Edema Control - Lymphedema / SCD / Other Left Lower Extremity Elevate legs to the level of the heart or above for 30 minutes daily and/or when sitting for 3-4 times a day throughout the day. Avoid standing for long periods of time. Exercise regularly - walking is good for circulation Wound Treatment Wound #13 - Lower Leg Wound Laterality: Left, Posterior Cleanser: Soap and Water 1 x Per Week/15 Days Discharge Instructions: May shower and wash wound with dial antibacterial soap and  water prior to dressing change. Cleanser: Vashe 5.8 (oz) 1 x Per Week/15 Days Discharge Instructions: Cleanse the wound with Vashe prior to applying a clean dressing using gauze sponges, not tissue or cotton balls. Peri-Wound Care: Sween Lotion (Moisturizing lotion) 1 x Per Week/15 Days Discharge Instructions: Apply moisturizing lotion as directed Prim Dressing: Hydrofera Blue Ready Transfer Foam, 4x5 (in/in) 1 x Per Week/15 Days ary Discharge Instructions: Apply to wound bed as instructed Secondary Dressing: ABD Pad, 8x10 1 x Per Week/15 Days Discharge Instructions: Apply over primary dressing as directed. Compression Wrap: Urgo K2, (equivalent to a 4 layer) two layer compression system, regular 1 x Per Week/15 Days Discharge Instructions: Apply Urgo K2 as directed (alternative to 4 layer compression). Patient Medications llergies: No Known Allergies A Notifications Medication Indication Start End 03/03/2023 lidocaine DOSE topical 5 % ointment - ointment topical once daily Electronic Signature(s) Signed: 03/03/2023 4:34:08 PM By: Baltazar Najjar MD Signed: 03/03/2023 5:20:06 PM By: Redmond Pulling RN, BSN Entered By: Redmond Pulling on 03/03/2023 11:35:36 -------------------------------------------------------------------------------- Problem List Details Patient Name: Date of Service: [name] Asper NDY J. 03/03/2023 10:30 A M Medical Record Number: [id number] Patient Account Number: [id number] Date of Birth/Sex: Treating RN: [birth year] (67 y.o. 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 03/03/2023 No Yes severity I87.332 Chronic venous hypertension (idiopathic) with ulcer and inflammation of left  03/03/2023 No Yes lower extremity [name], [name] ([id number]) [id number] Page 6 of 13 E11.622 Type 2 diabetes mellitus with other skin ulcer 03/03/2023 No Yes Inactive Problems Resolved Problems Electronic Signature(s) Signed: 03/03/2023 4:34:08 PM By: Baltazar Najjar MD Entered By: Baltazar Najjar on 03/03/2023 12:28:56 -------------------------------------------------------------------------------- Progress Note Details Patient Name: Date of Service: [name] Asper NDY J. 03/03/2023 10:30 A M Medical Record Number: [id number] Patient Account Number: [id number] Date of Birth/Sex: Treating RN: 11/27/1955 (67 y.o. M) Primary Care [name]: Hillard Danker Other Clinician: Referring [name]: Treating [name]/Extender: Shara Blazing in Treatment: 0 Subjective Chief Complaint Information obtained from Patient 07/29/2018; patient is here for review of a fairly sizable wound  [name], [name] ([id number]) [id number] Page 1 of 13 Visit Report for 03/03/2023 Chief Complaint Document Details Patient Name: Date of Service: [name], [name] 03/03/2023 10:30 A M Medical Record Number: [id number] Patient Account Number: [id number] Date of Birth/Sex: Treating RN: 03/24/1956 (67 y.o. M) Primary Care [name]: Hillard Danker Other Clinician: Referring [name]: Treating [name]/Extender: Shara Blazing in Treatment: 0 Information Obtained from: Patient Chief Complaint 07/29/2018; patient is here for review of a fairly sizable wound on his right posterior calf 03/14/16/24; patient is here for review of a sizable wound on the left posterior calf Electronic Signature(s) Signed: 03/03/2023 4:34:08 PM By: Baltazar Najjar MD Entered By: Baltazar Najjar on 03/03/2023 12:18:49 -------------------------------------------------------------------------------- Debridement Details Patient Name: Date of Service: [name] Asper NDY J. 03/03/2023 10:30 A M Medical Record Number: [id number] Patient Account Number: [id number] Date of Birth/Sex: Treating RN: May 12, 1956 (67 y.o. M) Primary Care [name]: Hillard Danker Other Clinician: Referring [name]: Treating [name]/Extender: Shara Blazing in Treatment: 0 Debridement Performed for Assessment: Wound #13 Left,Posterior Lower Leg Performed By: Physician Maxwell Caul., MD The following information was scribed by: Redmond Pulling The information was scribed for: Baltazar Najjar Debridement Type: Debridement Severity of Tissue Pre Debridement: Fat layer exposed Level of Consciousness (Pre-procedure): Awake and Alert Pre-procedure Verification/Time Out Yes - 11:25 Taken: Start Time: 11:30 Pain Control: Lidocaine 5% topical ointment Percent of Wound Bed Debrided: 100% T Area Debrided (cm): otal 19.43 Tissue and other material debrided:  Non-Viable, Eschar, Slough, Subcutaneous, Slough Level: Skin/Subcutaneous Tissue Debridement Description: Excisional Instrument: Curette Bleeding: Minimum Hemostasis Achieved: Pressure Response to Treatment: Procedure was tolerated well Level of Consciousness (Post- Awake and Alert procedure): Post Debridement Measurements of Total Wound Length: (cm) 5.5 Width: (cm) 4.5 Depth: (cm) 0.2 Volume: (cm) 3.888 Character of Wound/Ulcer Post Debridement: Improved [name], [name] ([id number]) [id number] Page 2 of 13 Severity of Tissue Post Debridement: Fat layer exposed Post Procedure Diagnosis Same as Pre-procedure Electronic Signature(s) Signed: 03/03/2023 4:34:08 PM By: Baltazar Najjar MD Entered By: Baltazar Najjar on 03/03/2023 12:18:18 -------------------------------------------------------------------------------- HPI Details Patient Name: Date of Service: [name] Asper NDY J. 03/03/2023 10:30 A M Medical Record Number: [id number] Patient Account Number: [id number] Date of Birth/Sex: Treating RN: 11-21-1955 (67 y.o. M) Primary Care [name]: Hillard Danker Other Clinician: Referring [name]: Treating [name]/Extender: Shara Blazing in Treatment: 0 History of Present Illness HPI Description: ADMISSION 07/29/2018 Mr. Goodlet is a 67 year old man with either prediabetes or diabetes he is on glipizide. In late October to November 2019 he noted a scabbed area on the back of his right calf. He picked this off a few times but it would not heal. He saw his primary physician on 12/5. It was felt at that time that this may actually heal on its own with conservative management. The next visit was on 07/20/2018 noted the area was a lot worse and arranged for his treatment here. He has been topical antibiotics like Neosporin although he stopped using this when the wound looked worse. He is just been covering this with a clean Band-Aid.  He is given Bactrim a week ago and he is finishing this currently. It is made some improvement in the surrounding erythema per the patient. He does not have a history of nonhealing wounds. No prior history of wounds on his legs that he had difficulty healing. No prior skin issues. 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 Past medical history; A. fib status post ablation and recent  [name], [name] ([id number]) [id number] Page 1 of 13 Visit Report for 03/03/2023 Chief Complaint Document Details Patient Name: Date of Service: [name], [name] 03/03/2023 10:30 A M Medical Record Number: [id number] Patient Account Number: [id number] Date of Birth/Sex: Treating RN: 03/24/1956 (67 y.o. M) Primary Care [name]: Hillard Danker Other Clinician: Referring [name]: Treating [name]/Extender: Shara Blazing in Treatment: 0 Information Obtained from: Patient Chief Complaint 07/29/2018; patient is here for review of a fairly sizable wound on his right posterior calf 03/14/16/24; patient is here for review of a sizable wound on the left posterior calf Electronic Signature(s) Signed: 03/03/2023 4:34:08 PM By: Baltazar Najjar MD Entered By: Baltazar Najjar on 03/03/2023 12:18:49 -------------------------------------------------------------------------------- Debridement Details Patient Name: Date of Service: [name] Asper NDY J. 03/03/2023 10:30 A M Medical Record Number: [id number] Patient Account Number: [id number] Date of Birth/Sex: Treating RN: May 12, 1956 (67 y.o. M) Primary Care [name]: Hillard Danker Other Clinician: Referring [name]: Treating [name]/Extender: Shara Blazing in Treatment: 0 Debridement Performed for Assessment: Wound #13 Left,Posterior Lower Leg Performed By: Physician Maxwell Caul., MD The following information was scribed by: Redmond Pulling The information was scribed for: Baltazar Najjar Debridement Type: Debridement Severity of Tissue Pre Debridement: Fat layer exposed Level of Consciousness (Pre-procedure): Awake and Alert Pre-procedure Verification/Time Out Yes - 11:25 Taken: Start Time: 11:30 Pain Control: Lidocaine 5% topical ointment Percent of Wound Bed Debrided: 100% T Area Debrided (cm): otal 19.43 Tissue and other material debrided:  Non-Viable, Eschar, Slough, Subcutaneous, Slough Level: Skin/Subcutaneous Tissue Debridement Description: Excisional Instrument: Curette Bleeding: Minimum Hemostasis Achieved: Pressure Response to Treatment: Procedure was tolerated well Level of Consciousness (Post- Awake and Alert procedure): Post Debridement Measurements of Total Wound Length: (cm) 5.5 Width: (cm) 4.5 Depth: (cm) 0.2 Volume: (cm) 3.888 Character of Wound/Ulcer Post Debridement: Improved [name], [name] ([id number]) [id number] Page 2 of 13 Severity of Tissue Post Debridement: Fat layer exposed Post Procedure Diagnosis Same as Pre-procedure Electronic Signature(s) Signed: 03/03/2023 4:34:08 PM By: Baltazar Najjar MD Entered By: Baltazar Najjar on 03/03/2023 12:18:18 -------------------------------------------------------------------------------- HPI Details Patient Name: Date of Service: [name] Asper NDY J. 03/03/2023 10:30 A M Medical Record Number: [id number] Patient Account Number: [id number] Date of Birth/Sex: Treating RN: 11-21-1955 (67 y.o. M) Primary Care [name]: Hillard Danker Other Clinician: Referring [name]: Treating [name]/Extender: Shara Blazing in Treatment: 0 History of Present Illness HPI Description: ADMISSION 07/29/2018 Mr. Goodlet is a 67 year old man with either prediabetes or diabetes he is on glipizide. In late October to November 2019 he noted a scabbed area on the back of his right calf. He picked this off a few times but it would not heal. He saw his primary physician on 12/5. It was felt at that time that this may actually heal on its own with conservative management. The next visit was on 07/20/2018 noted the area was a lot worse and arranged for his treatment here. He has been topical antibiotics like Neosporin although he stopped using this when the wound looked worse. He is just been covering this with a clean Band-Aid.  He is given Bactrim a week ago and he is finishing this currently. It is made some improvement in the surrounding erythema per the patient. He does not have a history of nonhealing wounds. No prior history of wounds on his legs that he had difficulty healing. No prior skin issues. 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 Past medical history; A. fib status post ablation and recent  Physician Orders Details Patient Name: Date of Service: [name], [name] 03/03/2023 10:30 A M Medical Record Number: [id number] Patient Account Number: [id number] Date of Birth/Sex: Treating RN: 04-19-1956 (67 y.o. [name]: Hillard Danker Other Clinician: Referring [name]: Treating [name]/Extender: Shara Blazing in Treatment: 0 Verbal / Phone Orders: No Diagnosis Coding Follow-up Appointments ppointment in 1 week. - Dr Leanord Hawking Return A Anesthetic Wound #13 Left,Posterior Lower Leg (In clinic) Topical Lidocaine 5% applied to wound bed [name], [name] ([id number]) [id number] Page 5 of 13 Bathing/ Shower/ Hygiene May shower with protection but do not get wound dressing(s) wet. Protect dressing(s) with water repellant cover (for example, large plastic bag) or a cast cover and may then take shower. Edema Control - Lymphedema / SCD / Other Left Lower Extremity Elevate legs to the level of the heart or above for 30 minutes daily and/or when sitting for 3-4 times a day throughout the day. Avoid standing for long periods of time. Exercise regularly - walking is good for circulation Wound Treatment Wound #13 - Lower Leg Wound Laterality: Left, Posterior Cleanser: Soap and Water 1 x Per Week/15 Days Discharge Instructions: May shower and wash wound with dial antibacterial soap and  water prior to dressing change. Cleanser: Vashe 5.8 (oz) 1 x Per Week/15 Days Discharge Instructions: Cleanse the wound with Vashe prior to applying a clean dressing using gauze sponges, not tissue or cotton balls. Peri-Wound Care: Sween Lotion (Moisturizing lotion) 1 x Per Week/15 Days Discharge Instructions: Apply moisturizing lotion as directed Prim Dressing: Hydrofera Blue Ready Transfer Foam, 4x5 (in/in) 1 x Per Week/15 Days ary Discharge Instructions: Apply to wound bed as instructed Secondary Dressing: ABD Pad, 8x10 1 x Per Week/15 Days Discharge Instructions: Apply over primary dressing as directed. Compression Wrap: Urgo K2, (equivalent to a 4 layer) two layer compression system, regular 1 x Per Week/15 Days Discharge Instructions: Apply Urgo K2 as directed (alternative to 4 layer compression). Patient Medications llergies: No Known Allergies A Notifications Medication Indication Start End 03/03/2023 lidocaine DOSE topical 5 % ointment - ointment topical once daily Electronic Signature(s) Signed: 03/03/2023 4:34:08 PM By: Baltazar Najjar MD Signed: 03/03/2023 5:20:06 PM By: Redmond Pulling RN, BSN Entered By: Redmond Pulling on 03/03/2023 11:35:36 -------------------------------------------------------------------------------- Problem List Details Patient Name: Date of Service: [name] Asper NDY J. 03/03/2023 10:30 A M Medical Record Number: [id number] Patient Account Number: [id number] Date of Birth/Sex: Treating RN: [birth year] (67 y.o. 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 03/03/2023 No Yes severity I87.332 Chronic venous hypertension (idiopathic) with ulcer and inflammation of left  03/03/2023 No Yes lower extremity [name], [name] ([id number]) [id number] Page 6 of 13 E11.622 Type 2 diabetes mellitus with other skin ulcer 03/03/2023 No Yes Inactive Problems Resolved Problems Electronic Signature(s) Signed: 03/03/2023 4:34:08 PM By: Baltazar Najjar MD Entered By: Baltazar Najjar on 03/03/2023 12:28:56 -------------------------------------------------------------------------------- Progress Note Details Patient Name: Date of Service: [name] Asper NDY J. 03/03/2023 10:30 A M Medical Record Number: [id number] Patient Account Number: [id number] Date of Birth/Sex: Treating RN: 11/27/1955 (67 y.o. M) Primary Care [name]: Hillard Danker Other Clinician: Referring [name]: Treating [name]/Extender: Shara Blazing in Treatment: 0 Subjective Chief Complaint Information obtained from Patient 07/29/2018; patient is here for review of a fairly sizable wound  [name], [name] ([id number]) [id number] Page 1 of 13 Visit Report for 03/03/2023 Chief Complaint Document Details Patient Name: Date of Service: [name], [name] 03/03/2023 10:30 A M Medical Record Number: [id number] Patient Account Number: [id number] Date of Birth/Sex: Treating RN: 03/24/1956 (67 y.o. M) Primary Care [name]: Hillard Danker Other Clinician: Referring [name]: Treating [name]/Extender: Shara Blazing in Treatment: 0 Information Obtained from: Patient Chief Complaint 07/29/2018; patient is here for review of a fairly sizable wound on his right posterior calf 03/14/16/24; patient is here for review of a sizable wound on the left posterior calf Electronic Signature(s) Signed: 03/03/2023 4:34:08 PM By: Baltazar Najjar MD Entered By: Baltazar Najjar on 03/03/2023 12:18:49 -------------------------------------------------------------------------------- Debridement Details Patient Name: Date of Service: [name] Asper NDY J. 03/03/2023 10:30 A M Medical Record Number: [id number] Patient Account Number: [id number] Date of Birth/Sex: Treating RN: May 12, 1956 (67 y.o. M) Primary Care [name]: Hillard Danker Other Clinician: Referring [name]: Treating [name]/Extender: Shara Blazing in Treatment: 0 Debridement Performed for Assessment: Wound #13 Left,Posterior Lower Leg Performed By: Physician Maxwell Caul., MD The following information was scribed by: Redmond Pulling The information was scribed for: Baltazar Najjar Debridement Type: Debridement Severity of Tissue Pre Debridement: Fat layer exposed Level of Consciousness (Pre-procedure): Awake and Alert Pre-procedure Verification/Time Out Yes - 11:25 Taken: Start Time: 11:30 Pain Control: Lidocaine 5% topical ointment Percent of Wound Bed Debrided: 100% T Area Debrided (cm): otal 19.43 Tissue and other material debrided:  Non-Viable, Eschar, Slough, Subcutaneous, Slough Level: Skin/Subcutaneous Tissue Debridement Description: Excisional Instrument: Curette Bleeding: Minimum Hemostasis Achieved: Pressure Response to Treatment: Procedure was tolerated well Level of Consciousness (Post- Awake and Alert procedure): Post Debridement Measurements of Total Wound Length: (cm) 5.5 Width: (cm) 4.5 Depth: (cm) 0.2 Volume: (cm) 3.888 Character of Wound/Ulcer Post Debridement: Improved [name], [name] ([id number]) [id number] Page 2 of 13 Severity of Tissue Post Debridement: Fat layer exposed Post Procedure Diagnosis Same as Pre-procedure Electronic Signature(s) Signed: 03/03/2023 4:34:08 PM By: Baltazar Najjar MD Entered By: Baltazar Najjar on 03/03/2023 12:18:18 -------------------------------------------------------------------------------- HPI Details Patient Name: Date of Service: [name] Asper NDY J. 03/03/2023 10:30 A M Medical Record Number: [id number] Patient Account Number: [id number] Date of Birth/Sex: Treating RN: 11-21-1955 (67 y.o. M) Primary Care [name]: Hillard Danker Other Clinician: Referring [name]: Treating [name]/Extender: Shara Blazing in Treatment: 0 History of Present Illness HPI Description: ADMISSION 07/29/2018 Mr. Goodlet is a 67 year old man with either prediabetes or diabetes he is on glipizide. In late October to November 2019 he noted a scabbed area on the back of his right calf. He picked this off a few times but it would not heal. He saw his primary physician on 12/5. It was felt at that time that this may actually heal on its own with conservative management. The next visit was on 07/20/2018 noted the area was a lot worse and arranged for his treatment here. He has been topical antibiotics like Neosporin although he stopped using this when the wound looked worse. He is just been covering this with a clean Band-Aid.  He is given Bactrim a week ago and he is finishing this currently. It is made some improvement in the surrounding erythema per the patient. He does not have a history of nonhealing wounds. No prior history of wounds on his legs that he had difficulty healing. No prior skin issues. 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 Past medical history; A. fib status post ablation and recent  [name], [name] ([id number]) [id number] Page 1 of 13 Visit Report for 03/03/2023 Chief Complaint Document Details Patient Name: Date of Service: [name], [name] 03/03/2023 10:30 A M Medical Record Number: [id number] Patient Account Number: [id number] Date of Birth/Sex: Treating RN: 03/24/1956 (67 y.o. M) Primary Care [name]: Hillard Danker Other Clinician: Referring [name]: Treating [name]/Extender: Shara Blazing in Treatment: 0 Information Obtained from: Patient Chief Complaint 07/29/2018; patient is here for review of a fairly sizable wound on his right posterior calf 03/14/16/24; patient is here for review of a sizable wound on the left posterior calf Electronic Signature(s) Signed: 03/03/2023 4:34:08 PM By: Baltazar Najjar MD Entered By: Baltazar Najjar on 03/03/2023 12:18:49 -------------------------------------------------------------------------------- Debridement Details Patient Name: Date of Service: [name] Asper NDY J. 03/03/2023 10:30 A M Medical Record Number: [id number] Patient Account Number: [id number] Date of Birth/Sex: Treating RN: May 12, 1956 (67 y.o. M) Primary Care [name]: Hillard Danker Other Clinician: Referring [name]: Treating [name]/Extender: Shara Blazing in Treatment: 0 Debridement Performed for Assessment: Wound #13 Left,Posterior Lower Leg Performed By: Physician Maxwell Caul., MD The following information was scribed by: Redmond Pulling The information was scribed for: Baltazar Najjar Debridement Type: Debridement Severity of Tissue Pre Debridement: Fat layer exposed Level of Consciousness (Pre-procedure): Awake and Alert Pre-procedure Verification/Time Out Yes - 11:25 Taken: Start Time: 11:30 Pain Control: Lidocaine 5% topical ointment Percent of Wound Bed Debrided: 100% T Area Debrided (cm): otal 19.43 Tissue and other material debrided:  Non-Viable, Eschar, Slough, Subcutaneous, Slough Level: Skin/Subcutaneous Tissue Debridement Description: Excisional Instrument: Curette Bleeding: Minimum Hemostasis Achieved: Pressure Response to Treatment: Procedure was tolerated well Level of Consciousness (Post- Awake and Alert procedure): Post Debridement Measurements of Total Wound Length: (cm) 5.5 Width: (cm) 4.5 Depth: (cm) 0.2 Volume: (cm) 3.888 Character of Wound/Ulcer Post Debridement: Improved [name], [name] ([id number]) [id number] Page 2 of 13 Severity of Tissue Post Debridement: Fat layer exposed Post Procedure Diagnosis Same as Pre-procedure Electronic Signature(s) Signed: 03/03/2023 4:34:08 PM By: Baltazar Najjar MD Entered By: Baltazar Najjar on 03/03/2023 12:18:18 -------------------------------------------------------------------------------- HPI Details Patient Name: Date of Service: [name] Asper NDY J. 03/03/2023 10:30 A M Medical Record Number: [id number] Patient Account Number: [id number] Date of Birth/Sex: Treating RN: 11-21-1955 (67 y.o. M) Primary Care [name]: Hillard Danker Other Clinician: Referring [name]: Treating [name]/Extender: Shara Blazing in Treatment: 0 History of Present Illness HPI Description: ADMISSION 07/29/2018 Mr. Goodlet is a 67 year old man with either prediabetes or diabetes he is on glipizide. In late October to November 2019 he noted a scabbed area on the back of his right calf. He picked this off a few times but it would not heal. He saw his primary physician on 12/5. It was felt at that time that this may actually heal on its own with conservative management. The next visit was on 07/20/2018 noted the area was a lot worse and arranged for his treatment here. He has been topical antibiotics like Neosporin although he stopped using this when the wound looked worse. He is just been covering this with a clean Band-Aid.  He is given Bactrim a week ago and he is finishing this currently. It is made some improvement in the surrounding erythema per the patient. He does not have a history of nonhealing wounds. No prior history of wounds on his legs that he had difficulty healing. No prior skin issues. 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 Past medical history; A. fib status post ablation and recent  Physician Orders Details Patient Name: Date of Service: [name], [name] 03/03/2023 10:30 A M Medical Record Number: [id number] Patient Account Number: [id number] Date of Birth/Sex: Treating RN: 04-19-1956 (67 y.o. [name]: Hillard Danker Other Clinician: Referring [name]: Treating [name]/Extender: Shara Blazing in Treatment: 0 Verbal / Phone Orders: No Diagnosis Coding Follow-up Appointments ppointment in 1 week. - Dr Leanord Hawking Return A Anesthetic Wound #13 Left,Posterior Lower Leg (In clinic) Topical Lidocaine 5% applied to wound bed [name], [name] ([id number]) [id number] Page 5 of 13 Bathing/ Shower/ Hygiene May shower with protection but do not get wound dressing(s) wet. Protect dressing(s) with water repellant cover (for example, large plastic bag) or a cast cover and may then take shower. Edema Control - Lymphedema / SCD / Other Left Lower Extremity Elevate legs to the level of the heart or above for 30 minutes daily and/or when sitting for 3-4 times a day throughout the day. Avoid standing for long periods of time. Exercise regularly - walking is good for circulation Wound Treatment Wound #13 - Lower Leg Wound Laterality: Left, Posterior Cleanser: Soap and Water 1 x Per Week/15 Days Discharge Instructions: May shower and wash wound with dial antibacterial soap and  water prior to dressing change. Cleanser: Vashe 5.8 (oz) 1 x Per Week/15 Days Discharge Instructions: Cleanse the wound with Vashe prior to applying a clean dressing using gauze sponges, not tissue or cotton balls. Peri-Wound Care: Sween Lotion (Moisturizing lotion) 1 x Per Week/15 Days Discharge Instructions: Apply moisturizing lotion as directed Prim Dressing: Hydrofera Blue Ready Transfer Foam, 4x5 (in/in) 1 x Per Week/15 Days ary Discharge Instructions: Apply to wound bed as instructed Secondary Dressing: ABD Pad, 8x10 1 x Per Week/15 Days Discharge Instructions: Apply over primary dressing as directed. Compression Wrap: Urgo K2, (equivalent to a 4 layer) two layer compression system, regular 1 x Per Week/15 Days Discharge Instructions: Apply Urgo K2 as directed (alternative to 4 layer compression). Patient Medications llergies: No Known Allergies A Notifications Medication Indication Start End 03/03/2023 lidocaine DOSE topical 5 % ointment - ointment topical once daily Electronic Signature(s) Signed: 03/03/2023 4:34:08 PM By: Baltazar Najjar MD Signed: 03/03/2023 5:20:06 PM By: Redmond Pulling RN, BSN Entered By: Redmond Pulling on 03/03/2023 11:35:36 -------------------------------------------------------------------------------- Problem List Details Patient Name: Date of Service: [name] Asper NDY J. 03/03/2023 10:30 A M Medical Record Number: [id number] Patient Account Number: [id number] Date of Birth/Sex: Treating RN: [birth year] (67 y.o. 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 03/03/2023 No Yes severity I87.332 Chronic venous hypertension (idiopathic) with ulcer and inflammation of left  03/03/2023 No Yes lower extremity [name], [name] ([id number]) [id number] Page 6 of 13 E11.622 Type 2 diabetes mellitus with other skin ulcer 03/03/2023 No Yes Inactive Problems Resolved Problems Electronic Signature(s) Signed: 03/03/2023 4:34:08 PM By: Baltazar Najjar MD Entered By: Baltazar Najjar on 03/03/2023 12:28:56 -------------------------------------------------------------------------------- Progress Note Details Patient Name: Date of Service: [name] Asper NDY J. 03/03/2023 10:30 A M Medical Record Number: [id number] Patient Account Number: [id number] Date of Birth/Sex: Treating RN: 11/27/1955 (67 y.o. M) Primary Care [name]: Hillard Danker Other Clinician: Referring [name]: Treating [name]/Extender: Shara Blazing in Treatment: 0 Subjective Chief Complaint Information obtained from Patient 07/29/2018; patient is here for review of a fairly sizable wound  Physician Orders Details Patient Name: Date of Service: [name], [name] 03/03/2023 10:30 A M Medical Record Number: [id number] Patient Account Number: [id number] Date of Birth/Sex: Treating RN: 04-19-1956 (67 y.o. [name]: Hillard Danker Other Clinician: Referring [name]: Treating [name]/Extender: Shara Blazing in Treatment: 0 Verbal / Phone Orders: No Diagnosis Coding Follow-up Appointments ppointment in 1 week. - Dr Leanord Hawking Return A Anesthetic Wound #13 Left,Posterior Lower Leg (In clinic) Topical Lidocaine 5% applied to wound bed [name], [name] ([id number]) [id number] Page 5 of 13 Bathing/ Shower/ Hygiene May shower with protection but do not get wound dressing(s) wet. Protect dressing(s) with water repellant cover (for example, large plastic bag) or a cast cover and may then take shower. Edema Control - Lymphedema / SCD / Other Left Lower Extremity Elevate legs to the level of the heart or above for 30 minutes daily and/or when sitting for 3-4 times a day throughout the day. Avoid standing for long periods of time. Exercise regularly - walking is good for circulation Wound Treatment Wound #13 - Lower Leg Wound Laterality: Left, Posterior Cleanser: Soap and Water 1 x Per Week/15 Days Discharge Instructions: May shower and wash wound with dial antibacterial soap and  water prior to dressing change. Cleanser: Vashe 5.8 (oz) 1 x Per Week/15 Days Discharge Instructions: Cleanse the wound with Vashe prior to applying a clean dressing using gauze sponges, not tissue or cotton balls. Peri-Wound Care: Sween Lotion (Moisturizing lotion) 1 x Per Week/15 Days Discharge Instructions: Apply moisturizing lotion as directed Prim Dressing: Hydrofera Blue Ready Transfer Foam, 4x5 (in/in) 1 x Per Week/15 Days ary Discharge Instructions: Apply to wound bed as instructed Secondary Dressing: ABD Pad, 8x10 1 x Per Week/15 Days Discharge Instructions: Apply over primary dressing as directed. Compression Wrap: Urgo K2, (equivalent to a 4 layer) two layer compression system, regular 1 x Per Week/15 Days Discharge Instructions: Apply Urgo K2 as directed (alternative to 4 layer compression). Patient Medications llergies: No Known Allergies A Notifications Medication Indication Start End 03/03/2023 lidocaine DOSE topical 5 % ointment - ointment topical once daily Electronic Signature(s) Signed: 03/03/2023 4:34:08 PM By: Baltazar Najjar MD Signed: 03/03/2023 5:20:06 PM By: Redmond Pulling RN, BSN Entered By: Redmond Pulling on 03/03/2023 11:35:36 -------------------------------------------------------------------------------- Problem List Details Patient Name: Date of Service: [name] Asper NDY J. 03/03/2023 10:30 A M Medical Record Number: [id number] Patient Account Number: [id number] Date of Birth/Sex: Treating RN: [birth year] (67 y.o. 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 03/03/2023 No Yes severity I87.332 Chronic venous hypertension (idiopathic) with ulcer and inflammation of left  03/03/2023 No Yes lower extremity [name], [name] ([id number]) [id number] Page 6 of 13 E11.622 Type 2 diabetes mellitus with other skin ulcer 03/03/2023 No Yes Inactive Problems Resolved Problems Electronic Signature(s) Signed: 03/03/2023 4:34:08 PM By: Baltazar Najjar MD Entered By: Baltazar Najjar on 03/03/2023 12:28:56 -------------------------------------------------------------------------------- Progress Note Details Patient Name: Date of Service: [name] Asper NDY J. 03/03/2023 10:30 A M Medical Record Number: [id number] Patient Account Number: [id number] Date of Birth/Sex: Treating RN: 11/27/1955 (67 y.o. M) Primary Care [name]: Hillard Danker Other Clinician: Referring [name]: Treating [name]/Extender: Shara Blazing in Treatment: 0 Subjective Chief Complaint Information obtained from Patient 07/29/2018; patient is here for review of a fairly sizable wound  Physician Orders Details Patient Name: Date of Service: [name], [name] 03/03/2023 10:30 A M Medical Record Number: [id number] Patient Account Number: [id number] Date of Birth/Sex: Treating RN: 04-19-1956 (67 y.o. [name]: Hillard Danker Other Clinician: Referring [name]: Treating [name]/Extender: Shara Blazing in Treatment: 0 Verbal / Phone Orders: No Diagnosis Coding Follow-up Appointments ppointment in 1 week. - Dr Leanord Hawking Return A Anesthetic Wound #13 Left,Posterior Lower Leg (In clinic) Topical Lidocaine 5% applied to wound bed [name], [name] ([id number]) [id number] Page 5 of 13 Bathing/ Shower/ Hygiene May shower with protection but do not get wound dressing(s) wet. Protect dressing(s) with water repellant cover (for example, large plastic bag) or a cast cover and may then take shower. Edema Control - Lymphedema / SCD / Other Left Lower Extremity Elevate legs to the level of the heart or above for 30 minutes daily and/or when sitting for 3-4 times a day throughout the day. Avoid standing for long periods of time. Exercise regularly - walking is good for circulation Wound Treatment Wound #13 - Lower Leg Wound Laterality: Left, Posterior Cleanser: Soap and Water 1 x Per Week/15 Days Discharge Instructions: May shower and wash wound with dial antibacterial soap and  water prior to dressing change. Cleanser: Vashe 5.8 (oz) 1 x Per Week/15 Days Discharge Instructions: Cleanse the wound with Vashe prior to applying a clean dressing using gauze sponges, not tissue or cotton balls. Peri-Wound Care: Sween Lotion (Moisturizing lotion) 1 x Per Week/15 Days Discharge Instructions: Apply moisturizing lotion as directed Prim Dressing: Hydrofera Blue Ready Transfer Foam, 4x5 (in/in) 1 x Per Week/15 Days ary Discharge Instructions: Apply to wound bed as instructed Secondary Dressing: ABD Pad, 8x10 1 x Per Week/15 Days Discharge Instructions: Apply over primary dressing as directed. Compression Wrap: Urgo K2, (equivalent to a 4 layer) two layer compression system, regular 1 x Per Week/15 Days Discharge Instructions: Apply Urgo K2 as directed (alternative to 4 layer compression). Patient Medications llergies: No Known Allergies A Notifications Medication Indication Start End 03/03/2023 lidocaine DOSE topical 5 % ointment - ointment topical once daily Electronic Signature(s) Signed: 03/03/2023 4:34:08 PM By: Baltazar Najjar MD Signed: 03/03/2023 5:20:06 PM By: Redmond Pulling RN, BSN Entered By: Redmond Pulling on 03/03/2023 11:35:36 -------------------------------------------------------------------------------- Problem List Details Patient Name: Date of Service: [name] Asper NDY J. 03/03/2023 10:30 A M Medical Record Number: [id number] Patient Account Number: [id number] Date of Birth/Sex: Treating RN: [birth year] (67 y.o. 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 03/03/2023 No Yes severity I87.332 Chronic venous hypertension (idiopathic) with ulcer and inflammation of left  03/03/2023 No Yes lower extremity [name], [name] ([id number]) [id number] Page 6 of 13 E11.622 Type 2 diabetes mellitus with other skin ulcer 03/03/2023 No Yes Inactive Problems Resolved Problems Electronic Signature(s) Signed: 03/03/2023 4:34:08 PM By: Baltazar Najjar MD Entered By: Baltazar Najjar on 03/03/2023 12:28:56 -------------------------------------------------------------------------------- Progress Note Details Patient Name: Date of Service: [name] Asper NDY J. 03/03/2023 10:30 A M Medical Record Number: [id number] Patient Account Number: [id number] Date of Birth/Sex: Treating RN: 11/27/1955 (67 y.o. M) Primary Care [name]: Hillard Danker Other Clinician: Referring [name]: Treating [name]/Extender: Shara Blazing in Treatment: 0 Subjective Chief Complaint Information obtained from Patient 07/29/2018; patient is here for review of a fairly sizable wound  Physician Orders Details Patient Name: Date of Service: [name], [name] 03/03/2023 10:30 A M Medical Record Number: [id number] Patient Account Number: [id number] Date of Birth/Sex: Treating RN: 04-19-1956 (67 y.o. [name]: Hillard Danker Other Clinician: Referring [name]: Treating [name]/Extender: Shara Blazing in Treatment: 0 Verbal / Phone Orders: No Diagnosis Coding Follow-up Appointments ppointment in 1 week. - Dr Leanord Hawking Return A Anesthetic Wound #13 Left,Posterior Lower Leg (In clinic) Topical Lidocaine 5% applied to wound bed [name], [name] ([id number]) [id number] Page 5 of 13 Bathing/ Shower/ Hygiene May shower with protection but do not get wound dressing(s) wet. Protect dressing(s) with water repellant cover (for example, large plastic bag) or a cast cover and may then take shower. Edema Control - Lymphedema / SCD / Other Left Lower Extremity Elevate legs to the level of the heart or above for 30 minutes daily and/or when sitting for 3-4 times a day throughout the day. Avoid standing for long periods of time. Exercise regularly - walking is good for circulation Wound Treatment Wound #13 - Lower Leg Wound Laterality: Left, Posterior Cleanser: Soap and Water 1 x Per Week/15 Days Discharge Instructions: May shower and wash wound with dial antibacterial soap and  water prior to dressing change. Cleanser: Vashe 5.8 (oz) 1 x Per Week/15 Days Discharge Instructions: Cleanse the wound with Vashe prior to applying a clean dressing using gauze sponges, not tissue or cotton balls. Peri-Wound Care: Sween Lotion (Moisturizing lotion) 1 x Per Week/15 Days Discharge Instructions: Apply moisturizing lotion as directed Prim Dressing: Hydrofera Blue Ready Transfer Foam, 4x5 (in/in) 1 x Per Week/15 Days ary Discharge Instructions: Apply to wound bed as instructed Secondary Dressing: ABD Pad, 8x10 1 x Per Week/15 Days Discharge Instructions: Apply over primary dressing as directed. Compression Wrap: Urgo K2, (equivalent to a 4 layer) two layer compression system, regular 1 x Per Week/15 Days Discharge Instructions: Apply Urgo K2 as directed (alternative to 4 layer compression). Patient Medications llergies: No Known Allergies A Notifications Medication Indication Start End 03/03/2023 lidocaine DOSE topical 5 % ointment - ointment topical once daily Electronic Signature(s) Signed: 03/03/2023 4:34:08 PM By: Baltazar Najjar MD Signed: 03/03/2023 5:20:06 PM By: Redmond Pulling RN, BSN Entered By: Redmond Pulling on 03/03/2023 11:35:36 -------------------------------------------------------------------------------- Problem List Details Patient Name: Date of Service: [name] Asper NDY J. 03/03/2023 10:30 A M Medical Record Number: [id number] Patient Account Number: [id number] Date of Birth/Sex: Treating RN: [birth year] (67 y.o. 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 03/03/2023 No Yes severity I87.332 Chronic venous hypertension (idiopathic) with ulcer and inflammation of left  03/03/2023 No Yes lower extremity [name], [name] ([id number]) [id number] Page 6 of 13 E11.622 Type 2 diabetes mellitus with other skin ulcer 03/03/2023 No Yes Inactive Problems Resolved Problems Electronic Signature(s) Signed: 03/03/2023 4:34:08 PM By: Baltazar Najjar MD Entered By: Baltazar Najjar on 03/03/2023 12:28:56 -------------------------------------------------------------------------------- Progress Note Details Patient Name: Date of Service: [name] Asper NDY J. 03/03/2023 10:30 A M Medical Record Number: [id number] Patient Account Number: [id number] Date of Birth/Sex: Treating RN: 11/27/1955 (67 y.o. M) Primary Care [name]: Hillard Danker Other Clinician: Referring [name]: Treating [name]/Extender: Shara Blazing in Treatment: 0 Subjective Chief Complaint Information obtained from Patient 07/29/2018; patient is here for review of a fairly sizable wound  Physician Orders Details Patient Name: Date of Service: [name], [name] 03/03/2023 10:30 A M Medical Record Number: [id number] Patient Account Number: [id number] Date of Birth/Sex: Treating RN: 04-19-1956 (67 y.o. [name]: Hillard Danker Other Clinician: Referring [name]: Treating [name]/Extender: Shara Blazing in Treatment: 0 Verbal / Phone Orders: No Diagnosis Coding Follow-up Appointments ppointment in 1 week. - Dr Leanord Hawking Return A Anesthetic Wound #13 Left,Posterior Lower Leg (In clinic) Topical Lidocaine 5% applied to wound bed [name], [name] ([id number]) [id number] Page 5 of 13 Bathing/ Shower/ Hygiene May shower with protection but do not get wound dressing(s) wet. Protect dressing(s) with water repellant cover (for example, large plastic bag) or a cast cover and may then take shower. Edema Control - Lymphedema / SCD / Other Left Lower Extremity Elevate legs to the level of the heart or above for 30 minutes daily and/or when sitting for 3-4 times a day throughout the day. Avoid standing for long periods of time. Exercise regularly - walking is good for circulation Wound Treatment Wound #13 - Lower Leg Wound Laterality: Left, Posterior Cleanser: Soap and Water 1 x Per Week/15 Days Discharge Instructions: May shower and wash wound with dial antibacterial soap and  water prior to dressing change. Cleanser: Vashe 5.8 (oz) 1 x Per Week/15 Days Discharge Instructions: Cleanse the wound with Vashe prior to applying a clean dressing using gauze sponges, not tissue or cotton balls. Peri-Wound Care: Sween Lotion (Moisturizing lotion) 1 x Per Week/15 Days Discharge Instructions: Apply moisturizing lotion as directed Prim Dressing: Hydrofera Blue Ready Transfer Foam, 4x5 (in/in) 1 x Per Week/15 Days ary Discharge Instructions: Apply to wound bed as instructed Secondary Dressing: ABD Pad, 8x10 1 x Per Week/15 Days Discharge Instructions: Apply over primary dressing as directed. Compression Wrap: Urgo K2, (equivalent to a 4 layer) two layer compression system, regular 1 x Per Week/15 Days Discharge Instructions: Apply Urgo K2 as directed (alternative to 4 layer compression). Patient Medications llergies: No Known Allergies A Notifications Medication Indication Start End 03/03/2023 lidocaine DOSE topical 5 % ointment - ointment topical once daily Electronic Signature(s) Signed: 03/03/2023 4:34:08 PM By: Baltazar Najjar MD Signed: 03/03/2023 5:20:06 PM By: Redmond Pulling RN, BSN Entered By: Redmond Pulling on 03/03/2023 11:35:36 -------------------------------------------------------------------------------- Problem List Details Patient Name: Date of Service: [name] Asper NDY J. 03/03/2023 10:30 A M Medical Record Number: [id number] Patient Account Number: [id number] Date of Birth/Sex: Treating RN: [birth year] (67 y.o. 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 03/03/2023 No Yes severity I87.332 Chronic venous hypertension (idiopathic) with ulcer and inflammation of left  03/03/2023 No Yes lower extremity [name], [name] ([id number]) [id number] Page 6 of 13 E11.622 Type 2 diabetes mellitus with other skin ulcer 03/03/2023 No Yes Inactive Problems Resolved Problems Electronic Signature(s) Signed: 03/03/2023 4:34:08 PM By: Baltazar Najjar MD Entered By: Baltazar Najjar on 03/03/2023 12:28:56 -------------------------------------------------------------------------------- Progress Note Details Patient Name: Date of Service: [name] Asper NDY J. 03/03/2023 10:30 A M Medical Record Number: [id number] Patient Account Number: [id number] Date of Birth/Sex: Treating RN: 11/27/1955 (67 y.o. M) Primary Care [name]: Hillard Danker Other Clinician: Referring [name]: Treating [name]/Extender: Shara Blazing in Treatment: 0 Subjective Chief Complaint Information obtained from Patient 07/29/2018; patient is here for review of a fairly sizable wound  Physician Orders Details Patient Name: Date of Service: [name], [name] 03/03/2023 10:30 A M Medical Record Number: [id number] Patient Account Number: [id number] Date of Birth/Sex: Treating RN: 04-19-1956 (67 y.o. [name]: Hillard Danker Other Clinician: Referring [name]: Treating [name]/Extender: Shara Blazing in Treatment: 0 Verbal / Phone Orders: No Diagnosis Coding Follow-up Appointments ppointment in 1 week. - Dr Leanord Hawking Return A Anesthetic Wound #13 Left,Posterior Lower Leg (In clinic) Topical Lidocaine 5% applied to wound bed [name], [name] ([id number]) [id number] Page 5 of 13 Bathing/ Shower/ Hygiene May shower with protection but do not get wound dressing(s) wet. Protect dressing(s) with water repellant cover (for example, large plastic bag) or a cast cover and may then take shower. Edema Control - Lymphedema / SCD / Other Left Lower Extremity Elevate legs to the level of the heart or above for 30 minutes daily and/or when sitting for 3-4 times a day throughout the day. Avoid standing for long periods of time. Exercise regularly - walking is good for circulation Wound Treatment Wound #13 - Lower Leg Wound Laterality: Left, Posterior Cleanser: Soap and Water 1 x Per Week/15 Days Discharge Instructions: May shower and wash wound with dial antibacterial soap and  water prior to dressing change. Cleanser: Vashe 5.8 (oz) 1 x Per Week/15 Days Discharge Instructions: Cleanse the wound with Vashe prior to applying a clean dressing using gauze sponges, not tissue or cotton balls. Peri-Wound Care: Sween Lotion (Moisturizing lotion) 1 x Per Week/15 Days Discharge Instructions: Apply moisturizing lotion as directed Prim Dressing: Hydrofera Blue Ready Transfer Foam, 4x5 (in/in) 1 x Per Week/15 Days ary Discharge Instructions: Apply to wound bed as instructed Secondary Dressing: ABD Pad, 8x10 1 x Per Week/15 Days Discharge Instructions: Apply over primary dressing as directed. Compression Wrap: Urgo K2, (equivalent to a 4 layer) two layer compression system, regular 1 x Per Week/15 Days Discharge Instructions: Apply Urgo K2 as directed (alternative to 4 layer compression). Patient Medications llergies: No Known Allergies A Notifications Medication Indication Start End 03/03/2023 lidocaine DOSE topical 5 % ointment - ointment topical once daily Electronic Signature(s) Signed: 03/03/2023 4:34:08 PM By: Baltazar Najjar MD Signed: 03/03/2023 5:20:06 PM By: Redmond Pulling RN, BSN Entered By: Redmond Pulling on 03/03/2023 11:35:36 -------------------------------------------------------------------------------- Problem List Details Patient Name: Date of Service: [name] Asper NDY J. 03/03/2023 10:30 A M Medical Record Number: [id number] Patient Account Number: [id number] Date of Birth/Sex: Treating RN: [birth year] (67 y.o. 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 03/03/2023 No Yes severity I87.332 Chronic venous hypertension (idiopathic) with ulcer and inflammation of left  03/03/2023 No Yes lower extremity [name], [name] ([id number]) [id number] Page 6 of 13 E11.622 Type 2 diabetes mellitus with other skin ulcer 03/03/2023 No Yes Inactive Problems Resolved Problems Electronic Signature(s) Signed: 03/03/2023 4:34:08 PM By: Baltazar Najjar MD Entered By: Baltazar Najjar on 03/03/2023 12:28:56 -------------------------------------------------------------------------------- Progress Note Details Patient Name: Date of Service: [name] Asper NDY J. 03/03/2023 10:30 A M Medical Record Number: [id number] Patient Account Number: [id number] Date of Birth/Sex: Treating RN: 11/27/1955 (67 y.o. M) Primary Care [name]: Hillard Danker Other Clinician: Referring [name]: Treating [name]/Extender: Shara Blazing in Treatment: 0 Subjective Chief Complaint Information obtained from Patient 07/29/2018; patient is here for review of a fairly sizable wound  Physician Orders Details Patient Name: Date of Service: [name], [name] 03/03/2023 10:30 A M Medical Record Number: [id number] Patient Account Number: [id number] Date of Birth/Sex: Treating RN: 04-19-1956 (67 y.o. [name]: Hillard Danker Other Clinician: Referring [name]: Treating [name]/Extender: Shara Blazing in Treatment: 0 Verbal / Phone Orders: No Diagnosis Coding Follow-up Appointments ppointment in 1 week. - Dr Leanord Hawking Return A Anesthetic Wound #13 Left,Posterior Lower Leg (In clinic) Topical Lidocaine 5% applied to wound bed [name], [name] ([id number]) [id number] Page 5 of 13 Bathing/ Shower/ Hygiene May shower with protection but do not get wound dressing(s) wet. Protect dressing(s) with water repellant cover (for example, large plastic bag) or a cast cover and may then take shower. Edema Control - Lymphedema / SCD / Other Left Lower Extremity Elevate legs to the level of the heart or above for 30 minutes daily and/or when sitting for 3-4 times a day throughout the day. Avoid standing for long periods of time. Exercise regularly - walking is good for circulation Wound Treatment Wound #13 - Lower Leg Wound Laterality: Left, Posterior Cleanser: Soap and Water 1 x Per Week/15 Days Discharge Instructions: May shower and wash wound with dial antibacterial soap and  water prior to dressing change. Cleanser: Vashe 5.8 (oz) 1 x Per Week/15 Days Discharge Instructions: Cleanse the wound with Vashe prior to applying a clean dressing using gauze sponges, not tissue or cotton balls. Peri-Wound Care: Sween Lotion (Moisturizing lotion) 1 x Per Week/15 Days Discharge Instructions: Apply moisturizing lotion as directed Prim Dressing: Hydrofera Blue Ready Transfer Foam, 4x5 (in/in) 1 x Per Week/15 Days ary Discharge Instructions: Apply to wound bed as instructed Secondary Dressing: ABD Pad, 8x10 1 x Per Week/15 Days Discharge Instructions: Apply over primary dressing as directed. Compression Wrap: Urgo K2, (equivalent to a 4 layer) two layer compression system, regular 1 x Per Week/15 Days Discharge Instructions: Apply Urgo K2 as directed (alternative to 4 layer compression). Patient Medications llergies: No Known Allergies A Notifications Medication Indication Start End 03/03/2023 lidocaine DOSE topical 5 % ointment - ointment topical once daily Electronic Signature(s) Signed: 03/03/2023 4:34:08 PM By: Baltazar Najjar MD Signed: 03/03/2023 5:20:06 PM By: Redmond Pulling RN, BSN Entered By: Redmond Pulling on 03/03/2023 11:35:36 -------------------------------------------------------------------------------- Problem List Details Patient Name: Date of Service: [name] Asper NDY J. 03/03/2023 10:30 A M Medical Record Number: [id number] Patient Account Number: [id number] Date of Birth/Sex: Treating RN: [birth year] (67 y.o. 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 03/03/2023 No Yes severity I87.332 Chronic venous hypertension (idiopathic) with ulcer and inflammation of left  03/03/2023 No Yes lower extremity [name], [name] ([id number]) [id number] Page 6 of 13 E11.622 Type 2 diabetes mellitus with other skin ulcer 03/03/2023 No Yes Inactive Problems Resolved Problems Electronic Signature(s) Signed: 03/03/2023 4:34:08 PM By: Baltazar Najjar MD Entered By: Baltazar Najjar on 03/03/2023 12:28:56 -------------------------------------------------------------------------------- Progress Note Details Patient Name: Date of Service: [name] Asper NDY J. 03/03/2023 10:30 A M Medical Record Number: [id number] Patient Account Number: [id number] Date of Birth/Sex: Treating RN: 11/27/1955 (67 y.o. M) Primary Care [name]: Hillard Danker Other Clinician: Referring [name]: Treating [name]/Extender: Shara Blazing in Treatment: 0 Subjective Chief Complaint Information obtained from Patient 07/29/2018; patient is here for review of a fairly sizable wound  Physician Orders Details Patient Name: Date of Service: [name], [name] 03/03/2023 10:30 A M Medical Record Number: [id number] Patient Account Number: [id number] Date of Birth/Sex: Treating RN: 04-19-1956 (67 y.o. [name]: Hillard Danker Other Clinician: Referring [name]: Treating [name]/Extender: Shara Blazing in Treatment: 0 Verbal / Phone Orders: No Diagnosis Coding Follow-up Appointments ppointment in 1 week. - Dr Leanord Hawking Return A Anesthetic Wound #13 Left,Posterior Lower Leg (In clinic) Topical Lidocaine 5% applied to wound bed [name], [name] ([id number]) [id number] Page 5 of 13 Bathing/ Shower/ Hygiene May shower with protection but do not get wound dressing(s) wet. Protect dressing(s) with water repellant cover (for example, large plastic bag) or a cast cover and may then take shower. Edema Control - Lymphedema / SCD / Other Left Lower Extremity Elevate legs to the level of the heart or above for 30 minutes daily and/or when sitting for 3-4 times a day throughout the day. Avoid standing for long periods of time. Exercise regularly - walking is good for circulation Wound Treatment Wound #13 - Lower Leg Wound Laterality: Left, Posterior Cleanser: Soap and Water 1 x Per Week/15 Days Discharge Instructions: May shower and wash wound with dial antibacterial soap and  water prior to dressing change. Cleanser: Vashe 5.8 (oz) 1 x Per Week/15 Days Discharge Instructions: Cleanse the wound with Vashe prior to applying a clean dressing using gauze sponges, not tissue or cotton balls. Peri-Wound Care: Sween Lotion (Moisturizing lotion) 1 x Per Week/15 Days Discharge Instructions: Apply moisturizing lotion as directed Prim Dressing: Hydrofera Blue Ready Transfer Foam, 4x5 (in/in) 1 x Per Week/15 Days ary Discharge Instructions: Apply to wound bed as instructed Secondary Dressing: ABD Pad, 8x10 1 x Per Week/15 Days Discharge Instructions: Apply over primary dressing as directed. Compression Wrap: Urgo K2, (equivalent to a 4 layer) two layer compression system, regular 1 x Per Week/15 Days Discharge Instructions: Apply Urgo K2 as directed (alternative to 4 layer compression). Patient Medications llergies: No Known Allergies A Notifications Medication Indication Start End 03/03/2023 lidocaine DOSE topical 5 % ointment - ointment topical once daily Electronic Signature(s) Signed: 03/03/2023 4:34:08 PM By: Baltazar Najjar MD Signed: 03/03/2023 5:20:06 PM By: Redmond Pulling RN, BSN Entered By: Redmond Pulling on 03/03/2023 11:35:36 -------------------------------------------------------------------------------- Problem List Details Patient Name: Date of Service: [name] Asper NDY J. 03/03/2023 10:30 A M Medical Record Number: [id number] Patient Account Number: [id number] Date of Birth/Sex: Treating RN: [birth year] (67 y.o. 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 03/03/2023 No Yes severity I87.332 Chronic venous hypertension (idiopathic) with ulcer and inflammation of left  03/03/2023 No Yes lower extremity [name], [name] ([id number]) [id number] Page 6 of 13 E11.622 Type 2 diabetes mellitus with other skin ulcer 03/03/2023 No Yes Inactive Problems Resolved Problems Electronic Signature(s) Signed: 03/03/2023 4:34:08 PM By: Baltazar Najjar MD Entered By: Baltazar Najjar on 03/03/2023 12:28:56 -------------------------------------------------------------------------------- Progress Note Details Patient Name: Date of Service: [name] Asper NDY J. 03/03/2023 10:30 A M Medical Record Number: [id number] Patient Account Number: [id number] Date of Birth/Sex: Treating RN: 11/27/1955 (67 y.o. M) Primary Care [name]: Hillard Danker Other Clinician: Referring [name]: Treating [name]/Extender: Shara Blazing in Treatment: 0 Subjective Chief Complaint Information obtained from Patient 07/29/2018; patient is here for review of a fairly sizable wound  Physician Orders Details Patient Name: Date of Service: [name], [name] 03/03/2023 10:30 A M Medical Record Number: [id number] Patient Account Number: [id number] Date of Birth/Sex: Treating RN: 04-19-1956 (67 y.o. [name]: Hillard Danker Other Clinician: Referring [name]: Treating [name]/Extender: Shara Blazing in Treatment: 0 Verbal / Phone Orders: No Diagnosis Coding Follow-up Appointments ppointment in 1 week. - Dr Leanord Hawking Return A Anesthetic Wound #13 Left,Posterior Lower Leg (In clinic) Topical Lidocaine 5% applied to wound bed [name], [name] ([id number]) [id number] Page 5 of 13 Bathing/ Shower/ Hygiene May shower with protection but do not get wound dressing(s) wet. Protect dressing(s) with water repellant cover (for example, large plastic bag) or a cast cover and may then take shower. Edema Control - Lymphedema / SCD / Other Left Lower Extremity Elevate legs to the level of the heart or above for 30 minutes daily and/or when sitting for 3-4 times a day throughout the day. Avoid standing for long periods of time. Exercise regularly - walking is good for circulation Wound Treatment Wound #13 - Lower Leg Wound Laterality: Left, Posterior Cleanser: Soap and Water 1 x Per Week/15 Days Discharge Instructions: May shower and wash wound with dial antibacterial soap and  water prior to dressing change. Cleanser: Vashe 5.8 (oz) 1 x Per Week/15 Days Discharge Instructions: Cleanse the wound with Vashe prior to applying a clean dressing using gauze sponges, not tissue or cotton balls. Peri-Wound Care: Sween Lotion (Moisturizing lotion) 1 x Per Week/15 Days Discharge Instructions: Apply moisturizing lotion as directed Prim Dressing: Hydrofera Blue Ready Transfer Foam, 4x5 (in/in) 1 x Per Week/15 Days ary Discharge Instructions: Apply to wound bed as instructed Secondary Dressing: ABD Pad, 8x10 1 x Per Week/15 Days Discharge Instructions: Apply over primary dressing as directed. Compression Wrap: Urgo K2, (equivalent to a 4 layer) two layer compression system, regular 1 x Per Week/15 Days Discharge Instructions: Apply Urgo K2 as directed (alternative to 4 layer compression). Patient Medications llergies: No Known Allergies A Notifications Medication Indication Start End 03/03/2023 lidocaine DOSE topical 5 % ointment - ointment topical once daily Electronic Signature(s) Signed: 03/03/2023 4:34:08 PM By: Baltazar Najjar MD Signed: 03/03/2023 5:20:06 PM By: Redmond Pulling RN, BSN Entered By: Redmond Pulling on 03/03/2023 11:35:36 -------------------------------------------------------------------------------- Problem List Details Patient Name: Date of Service: [name] Asper NDY J. 03/03/2023 10:30 A M Medical Record Number: [id number] Patient Account Number: [id number] Date of Birth/Sex: Treating RN: [birth year] (67 y.o. 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 03/03/2023 No Yes severity I87.332 Chronic venous hypertension (idiopathic) with ulcer and inflammation of left  03/03/2023 No Yes lower extremity [name], [name] ([id number]) [id number] Page 6 of 13 E11.622 Type 2 diabetes mellitus with other skin ulcer 03/03/2023 No Yes Inactive Problems Resolved Problems Electronic Signature(s) Signed: 03/03/2023 4:34:08 PM By: Baltazar Najjar MD Entered By: Baltazar Najjar on 03/03/2023 12:28:56 -------------------------------------------------------------------------------- Progress Note Details Patient Name: Date of Service: [name] Asper NDY J. 03/03/2023 10:30 A M Medical Record Number: [id number] Patient Account Number: [id number] Date of Birth/Sex: Treating RN: 11/27/1955 (67 y.o. M) Primary Care [name]: Hillard Danker Other Clinician: Referring [name]: Treating [name]/Extender: Shara Blazing in Treatment: 0 Subjective Chief Complaint Information obtained from Patient 07/29/2018; patient is here for review of a fairly sizable wound

## 2023-03-10 ENCOUNTER — Encounter (HOSPITAL_BASED_OUTPATIENT_CLINIC_OR_DEPARTMENT_OTHER): Payer: Medicare HMO | Admitting: Internal Medicine

## 2023-03-10 DIAGNOSIS — L97828 Non-pressure chronic ulcer of other part of left lower leg with other specified severity: Secondary | ICD-10-CM | POA: Diagnosis not present

## 2023-03-10 DIAGNOSIS — I5040 Unspecified combined systolic (congestive) and diastolic (congestive) heart failure: Secondary | ICD-10-CM | POA: Diagnosis not present

## 2023-03-10 DIAGNOSIS — I110 Hypertensive heart disease with heart failure: Secondary | ICD-10-CM | POA: Diagnosis not present

## 2023-03-10 DIAGNOSIS — I480 Paroxysmal atrial fibrillation: Secondary | ICD-10-CM | POA: Diagnosis not present

## 2023-03-10 DIAGNOSIS — Z9889 Other specified postprocedural states: Secondary | ICD-10-CM | POA: Diagnosis not present

## 2023-03-10 DIAGNOSIS — I428 Other cardiomyopathies: Secondary | ICD-10-CM | POA: Diagnosis not present

## 2023-03-10 DIAGNOSIS — L97222 Non-pressure chronic ulcer of left calf with fat layer exposed: Secondary | ICD-10-CM | POA: Diagnosis not present

## 2023-03-10 DIAGNOSIS — E11622 Type 2 diabetes mellitus with other skin ulcer: Secondary | ICD-10-CM | POA: Diagnosis not present

## 2023-03-10 DIAGNOSIS — Z7901 Long term (current) use of anticoagulants: Secondary | ICD-10-CM | POA: Diagnosis not present

## 2023-03-10 DIAGNOSIS — I87332 Chronic venous hypertension (idiopathic) with ulcer and inflammation of left lower extremity: Secondary | ICD-10-CM | POA: Diagnosis not present

## 2023-03-11 IMAGING — MR MR CARD MORPHOLOGY WO/W CM
45 of 48 series · 45 of 48 positions shown · IV contrast (Contrast agent)
Comparison: none

CLINICAL DATA: Assess for cardiac involvement by hemochromatosis

EXAM:
CARDIAC MRI
TECHNIQUE: The patient was scanned on a 1.5 Tesla GE magnet. A dedicated
cardiac coil was used. Functional imaging was done using [employer]
sequences. [DATE], and 4 chamber views were done to assess for RWMA's.
Modified Courick rule using a short axis stack was used to
calculate an ejection fraction on a dedicated work station using
[employer] software. The patient received 10 cc of Gadavist. After 10
minutes inversion recovery sequences were used to assess for
infiltration and scar tissue.
CONTRAST:  Gadavist 10 cc

[Series 4: [id number] · axial · 8.0mm · 1.64mm/px · 1 of 16 slices shown]
[im 1/16]
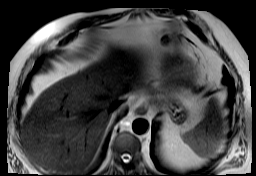

[Series 9: bSSFP · sagittal · 8.0mm · 1.88mm/px · 1 of 25 slices shown (1 of 21)]
[im 1/25]
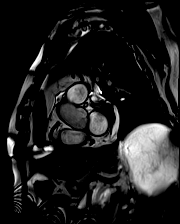

[Series 10: bSSFP · sagittal · 8.0mm · 1.88mm/px · 1 of 25 slices shown (2 of 21)]
[im 1/25]
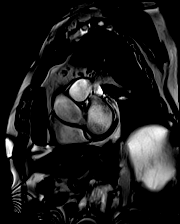

[Series 11: bSSFP · sagittal · 8.0mm · 1.88mm/px · 1 of 25 slices shown (3 of 21)]
[im 1/25]
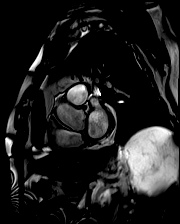

[Series 12: bSSFP · sagittal · 8.0mm · 1.88mm/px · 1 of 25 slices shown (4 of 21)]
[im 1/25]
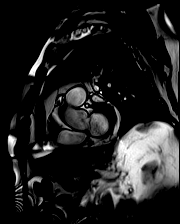

[Series 13: bSSFP · sagittal · 8.0mm · 1.88mm/px · 1 of 25 slices shown (5 of 21)]
[im 1/25]
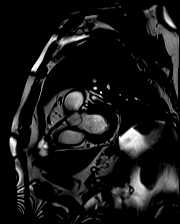

[Series 14: bSSFP · sagittal · 8.0mm · 1.88mm/px · 1 of 25 slices shown (6 of 21)]
[im 1/25]
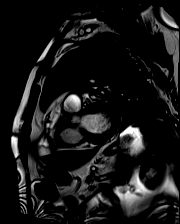

[Series 15: bSSFP · sagittal · 8.0mm · 1.88mm/px · 1 of 25 slices shown (7 of 21)]
[im 1/25]
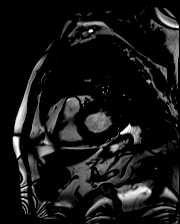

[Series 16: bSSFP · sagittal · 8.0mm · 1.88mm/px · 1 of 25 slices shown (8 of 21)]
[im 1/25]
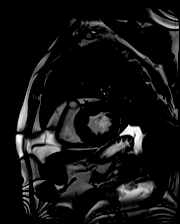

[Series 17: bSSFP · sagittal · 8.0mm · 1.88mm/px · 1 of 25 slices shown (9 of 21)]
[im 1/25]
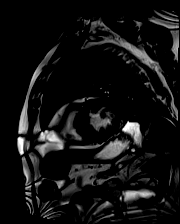

[Series 18: bSSFP · sagittal · 8.0mm · 1.88mm/px · 1 of 25 slices shown (10 of 21)]
[im 1/25]
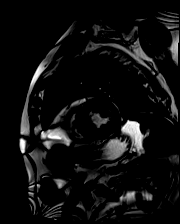

[Series 19: bSSFP · sagittal · 8.0mm · 1.88mm/px · 1 of 25 slices shown (11 of 21)]
[im 1/25]
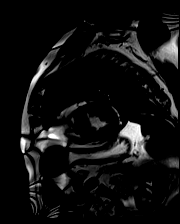

[Series 20: bSSFP · sagittal · 8.0mm · 1.88mm/px · 1 of 25 slices shown (12 of 21)]
[im 1/25]
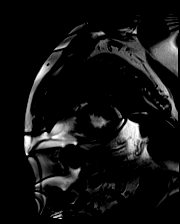

[Series 21: bSSFP · sagittal · 8.0mm · 1.88mm/px · 1 of 25 slices shown (13 of 21)]
[im 1/25]
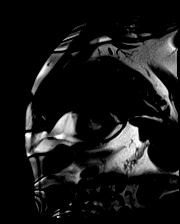

[Series 22: bSSFP · sagittal · 8.0mm · 1.88mm/px · 1 of 25 slices shown (14 of 21)]
[im 1/25]
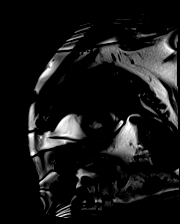

[Series 23: bSSFP · sagittal · 8.0mm · 1.88mm/px · 1 of 25 slices shown (15 of 21)]
[im 1/25]
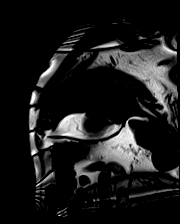

[Series 24: bSSFP · sagittal · 8.0mm · 1.88mm/px · 1 of 25 slices shown (16 of 21)]
[im 1/25]
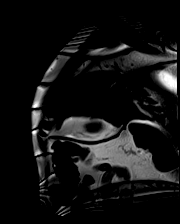

[Series 25: bSSFP · sagittal · 8.0mm · 1.88mm/px · 1 of 25 slices shown (17 of 21)]
[im 1/25]
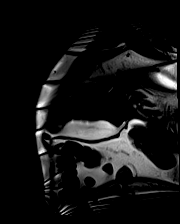

[Series 26: bSSFP · sagittal · 8.0mm · 1.88mm/px · 1 of 25 slices shown (18 of 21)]
[im 1/25]
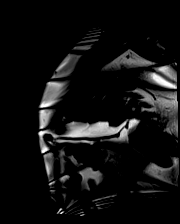

[Series 27: bSSFP · axial · 6.0mm · 1.56mm/px · 1 of 25 slices shown (19 of 21)]
[im 1/25]
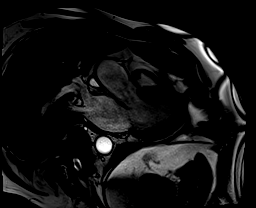

[Series 28: bSSFP · coronal · 6.0mm · 1.56mm/px · 1 of 25 slices shown (20 of 21)]
[im 1/25]
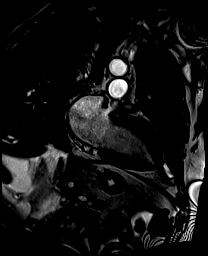

[Series 29: bSSFP · axial · 6.0mm · 1.56mm/px · 1 of 25 slices shown (21 of 21)]
[im 1/25]
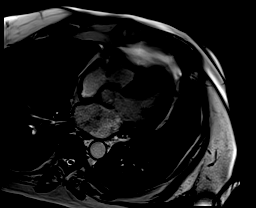

[Series 30: (id)__heart · oblique · 8.0mm · 1.88mm/px · 1 of 18 slices shown]
[im 1/18]
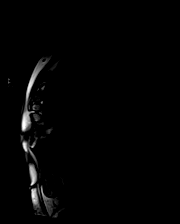

[Series 31: (id)__heart_(id) · oblique · 8.0mm · 1.88mm/px · 1 of 6 slices shown]
[im 1/6]
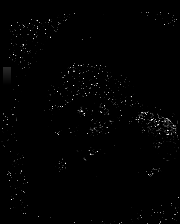

[Series 33: (id)_long_t1 · oblique · 8.0mm · 1.56mm/px · 1 of 24 slices shown]
[im 1/24]
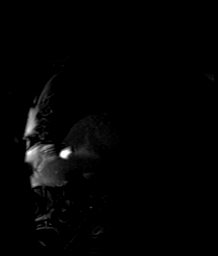

[Series 34: (id)_long_t1_moco · oblique · 8.0mm · 1.56mm/px · 1 of 24 slices shown]
[im 1/24]
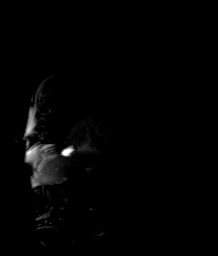

[Series 35: (id)_long_t1_moco_t1 · oblique · 8.0mm · 1.56mm/px · 1 of 3 slices shown (1 of 2)]
[im 1/3]
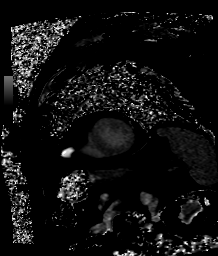

[Series 35: (id)_long_t1_moco_t1 · 1 of 3 slices shown (2 of 2)]
[im 1/3]
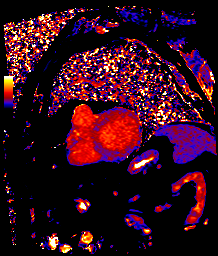

[Series 37: (id)_trufi · oblique · 8.0mm · 2.08mm/px · 1 of 9 slices shown]
[im 1/9]
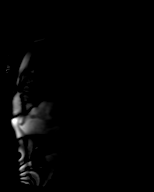

[Series 38: (id)_trufi_moco · oblique · 8.0mm · 2.08mm/px · 1 of 9 slices shown]
[im 1/9]
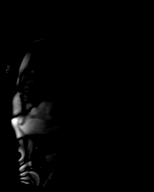

[Series 39: (id)_trufi_moco_t2 · oblique · 8.0mm · 2.08mm/px · 1 of 3 slices shown]
[im 1/3]
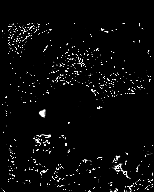

[Series 41: cine_trufi_cs_rt_short axis · sagittal · 8.0mm · 1.92mm/px · 1 of 13 slices shown (1 of 14)]
[im 1/13]
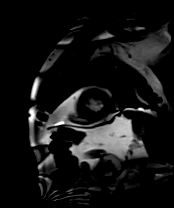

[Series 41: cine_trufi_cs_rt_short axis · sagittal · 8.0mm · 1.92mm/px · 1 of 13 slices shown (2 of 14)]
[im 1/13]
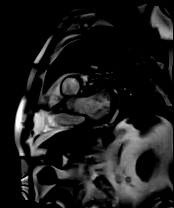

[Series 41: cine_trufi_cs_rt_short axis · sagittal · 8.0mm · 1.92mm/px · 1 of 13 slices shown (3 of 14)]
[im 1/13]
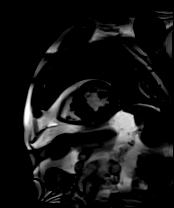

[Series 41: cine_trufi_cs_rt_short axis · sagittal · 8.0mm · 1.92mm/px · 1 of 13 slices shown (4 of 14)]
[im 1/13]
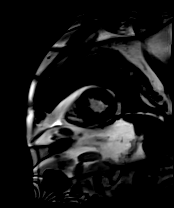

[Series 41: cine_trufi_cs_rt_short axis · sagittal · 8.0mm · 1.92mm/px · 1 of 13 slices shown (5 of 14)]
[im 1/13]
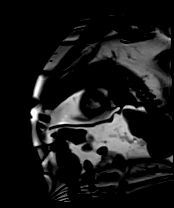

[Series 41: cine_trufi_cs_rt_short axis · sagittal · 8.0mm · 1.92mm/px · 1 of 13 slices shown (6 of 14)]
[im 1/13]
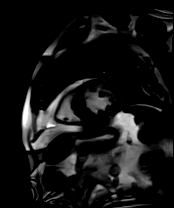

[Series 41: cine_trufi_cs_rt_short axis · sagittal · 8.0mm · 1.92mm/px · 1 of 13 slices shown (7 of 14)]
[im 1/13]
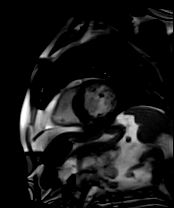

[Series 41: cine_trufi_cs_rt_short axis · sagittal · 8.0mm · 1.92mm/px · 1 of 13 slices shown (8 of 14)]
[im 1/13]
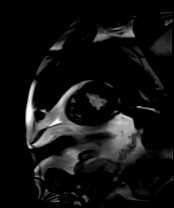

[Series 41: cine_trufi_cs_rt_short axis · sagittal · 8.0mm · 1.92mm/px · 1 of 13 slices shown (9 of 14)]
[im 1/13]
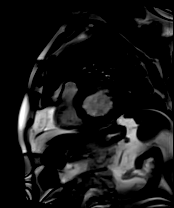

[Series 41: cine_trufi_cs_rt_short axis · sagittal · 8.0mm · 1.92mm/px · 1 of 13 slices shown (10 of 14)]
[im 1/13]
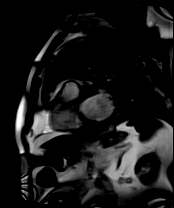

[Series 41: cine_trufi_cs_rt_short axis · sagittal · 8.0mm · 1.92mm/px · 1 of 13 slices shown (11 of 14)]
[im 1/13]
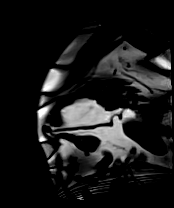

[Series 41: cine_trufi_cs_rt_short axis · sagittal · 8.0mm · 1.92mm/px · 1 of 13 slices shown (12 of 14)]
[im 1/13]
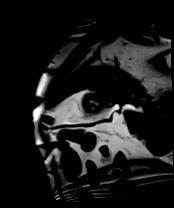

[Series 41: cine_trufi_cs_rt_short axis · sagittal · 8.0mm · 1.92mm/px · 1 of 13 slices shown (13 of 14)]
[im 1/13]
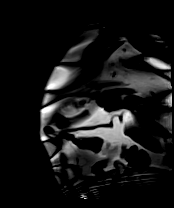

[Series 41: cine_trufi_cs_rt_short axis · sagittal · 8.0mm · 1.92mm/px · 1 of 13 slices shown (14 of 14)]
[im 1/13]
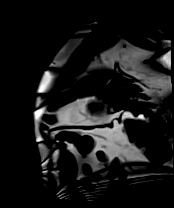

[45 of 48 positions shown; findings below may reference images not displayed]

FINDINGS: Limited images of the lung fields showed no gross abnormalities.

Normal left ventricular size with moderate basal to mid asymmetric
septal hypertrophy. Diffuse hypokinesis worse in the septal wall, EF
27%. Normal right ventricular size with RV EF 30%. Moderate biatrial
enlargement. Trileaflet aortic valve with no stenosis or
regurgitation. Images not adequate to quantify mitral regurgitation,
but most likely mild. No mitral valve systolic anterior motion
noted.

On delayed enhancement imaging, there was diffuse, extensive
primarily mid-wall late gadolinium enhancement (LGE). This is
especially prominent in the septum and basal inferolateral wall
(patchy). There is confluent mid wall LGE in the mid to apical
lateral and anterior walls.

MEASUREMENTS:
MEASUREMENTS
4Pstar: 34 msec

T1 1151 (septum) with ECV 50% (septum)

LVEDV 181 mL

LVSV 48 mL
LVEF 27%

RVEDV 107 mL
RVSV 32 mL
RVEF 30%
IMPRESSION: 1. Normal LV size with moderate asymmetric septal hypertrophy, EF
27%. Diffuse hypokinesis worse in the septal wall.

2.  Normal RV size with EF 30%.

3. Non-coronary pattern of LGE with diffuse mid-wall involvement,
especially prominent in the septum. 4Pstar evaluation is NOT
suggestive of cardiac hemochromatosis (>20 msec). Elevated T1 and
ECV percentage.

Findings appear most consistent with hypertrophic cardiomyopathy.
Less likely cardiac amyloidosis, but given very high ECV percentage,
need to consider. The high ECV percentage may simply be due to
extensive fibrosis in the setting of HCM. Cannot rule out cardiac
sarcoidosis.

Gye Joong Chulhan

## 2023-03-11 NOTE — Progress Notes (Signed)
Sween Lotion (Moisturizing lotion) 1 x Per Week/15 Days Discharge Instructions: Apply moisturizing lotion as directed Prim Dressing: Hydrofera Blue Ready Transfer Foam, 4x5 (in/in) 1 x Per Week/15 Days ary Discharge Instructions: Apply to wound bed as instructed Secondary Dressing: ABD Pad, 8x10 1 x Per Week/15 Days Discharge Instructions: Apply over primary dressing as directed. Secondary Dressing: Optifoam Non-Adhesive Dressing, 4x4 in 1 x Per Week/15 Days Discharge Instructions: Apply to ankle for protection. Compression Wrap: Urgo K2, (equivalent to a 4 layer) two layer compression system, regular 1 x Per Week/15 Days Discharge Instructions: Apply Urgo K2 as directed (alternative to 4 layer compression). Electronic Signature(s) Signed: 03/10/2023 4:52:49 PM By: Baltazar Najjar MD Signed: 03/10/2023 5:19:03 PM By: Shawn Stall RN, BSN Entered By: Shawn Stall on 03/10/2023 12:04:15 -------------------------------------------------------------------------------- Problem List Details Patient Name: Date of Service: [name]. 03/10/2023 1:45 PM Medical Record Number: [id number] Patient Account Number: [id number] Date of Birth/Sex: Treating RN: 1955-11-29 (67 y.o. [name] Primary Care Provider: Hillard Danker Other Clinician: Referring Provider: Treating Provider/Extender: Shara Blazing in Treatment: 1 Active Problems ICD-10 Encounter Code Description Active Date MDM Diagnosis [phone] Non-pressure chronic ulcer of other part of left lower leg with other specified 03/03/2023 No Yes severity I87.332 Chronic venous hypertension (idiopathic) with ulcer and inflammation of left 03/03/2023 No Yes lower extremity E11.622 Type 2 diabetes mellitus with other skin ulcer 03/03/2023 No Yes [name] ([id number]) [phone].pdf Page 5 of  8 Inactive Problems Resolved Problems Electronic Signature(s) Signed: 03/10/2023 4:52:49 PM By: Baltazar Najjar MD Entered By: Baltazar Najjar on 03/10/2023 12:10:18 -------------------------------------------------------------------------------- Progress Note Details Patient Name: Date of Service: [name]. 03/10/2023 1:45 PM Medical Record Number: [id number] Patient Account Number: [id number] Date of Birth/Sex: Treating RN: Aug 16, 1955 (67 y.o. M) Primary Care Provider: Hillard Danker Other Clinician: Referring Provider: Treating Provider/Extender: Shara Blazing in Treatment: 1 Subjective History of Present Illness (HPI) ADMISSION 07/29/2018 [name] is a 66 year old man with either prediabetes or diabetes he is on glipizide. In late October to November 2019 he noted a scabbed area on the back of his right calf. He picked this off a few times but it would not heal. He saw his primary physician on 12/5. It was felt at that time that this may actually heal on its own with conservative management. The next visit was on 07/20/2018 noted the area was a lot worse and arranged for his treatment here. He has been topical antibiotics like Neosporin although he stopped using this when the wound looked worse. He is just been covering this with a clean Band-Aid. He is given Bactrim a week ago and he is finishing this currently. It is made some improvement in the surrounding erythema per the patient. He does not have a history of nonhealing wounds. No prior history of wounds on his legs that he had difficulty healing. No prior skin issues. 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 Past medical history; A. fib status post ablation and recent cardioversion he is on Xarelto. He has a history of systolic heart failure, cervical radiculopathy, cardiomyopathy and type 2 diabetes as discussed ABI in the right leg was noncompressible on the right 2/20;  the biopsy I did on the patient last week was negative for malignancy. Culture grew Pseudomonas and methicillin sensitive staph aureus. He is on cefdinir 300 twice a day. I will have to make this a 10 day course. He put him in compression. He  Sween Lotion (Moisturizing lotion) 1 x Per Week/15 Days Discharge Instructions: Apply moisturizing lotion as directed Prim Dressing: Hydrofera Blue Ready Transfer Foam, 4x5 (in/in) 1 x Per Week/15 Days ary Discharge Instructions: Apply to wound bed as instructed Secondary Dressing: ABD Pad, 8x10 1 x Per Week/15 Days Discharge Instructions: Apply over primary dressing as directed. Secondary Dressing: Optifoam Non-Adhesive Dressing, 4x4 in 1 x Per Week/15 Days Discharge Instructions: Apply to ankle for protection. Compression Wrap: Urgo K2, (equivalent to a 4 layer) two layer compression system, regular 1 x Per Week/15 Days Discharge Instructions: Apply Urgo K2 as directed (alternative to 4 layer compression). Electronic Signature(s) Signed: 03/10/2023 4:52:49 PM By: Baltazar Najjar MD Signed: 03/10/2023 5:19:03 PM By: Shawn Stall RN, BSN Entered By: Shawn Stall on 03/10/2023 12:04:15 -------------------------------------------------------------------------------- Problem List Details Patient Name: Date of Service: [name]. 03/10/2023 1:45 PM Medical Record Number: [id number] Patient Account Number: [id number] Date of Birth/Sex: Treating RN: 1955-11-29 (67 y.o. [name] Primary Care Provider: Hillard Danker Other Clinician: Referring Provider: Treating Provider/Extender: Shara Blazing in Treatment: 1 Active Problems ICD-10 Encounter Code Description Active Date MDM Diagnosis [phone] Non-pressure chronic ulcer of other part of left lower leg with other specified 03/03/2023 No Yes severity I87.332 Chronic venous hypertension (idiopathic) with ulcer and inflammation of left 03/03/2023 No Yes lower extremity E11.622 Type 2 diabetes mellitus with other skin ulcer 03/03/2023 No Yes [name] ([id number]) [phone].pdf Page 5 of  8 Inactive Problems Resolved Problems Electronic Signature(s) Signed: 03/10/2023 4:52:49 PM By: Baltazar Najjar MD Entered By: Baltazar Najjar on 03/10/2023 12:10:18 -------------------------------------------------------------------------------- Progress Note Details Patient Name: Date of Service: [name]. 03/10/2023 1:45 PM Medical Record Number: [id number] Patient Account Number: [id number] Date of Birth/Sex: Treating RN: Aug 16, 1955 (67 y.o. M) Primary Care Provider: Hillard Danker Other Clinician: Referring Provider: Treating Provider/Extender: Shara Blazing in Treatment: 1 Subjective History of Present Illness (HPI) ADMISSION 07/29/2018 [name] is a 66 year old man with either prediabetes or diabetes he is on glipizide. In late October to November 2019 he noted a scabbed area on the back of his right calf. He picked this off a few times but it would not heal. He saw his primary physician on 12/5. It was felt at that time that this may actually heal on its own with conservative management. The next visit was on 07/20/2018 noted the area was a lot worse and arranged for his treatment here. He has been topical antibiotics like Neosporin although he stopped using this when the wound looked worse. He is just been covering this with a clean Band-Aid. He is given Bactrim a week ago and he is finishing this currently. It is made some improvement in the surrounding erythema per the patient. He does not have a history of nonhealing wounds. No prior history of wounds on his legs that he had difficulty healing. No prior skin issues. 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 Past medical history; A. fib status post ablation and recent cardioversion he is on Xarelto. He has a history of systolic heart failure, cervical radiculopathy, cardiomyopathy and type 2 diabetes as discussed ABI in the right leg was noncompressible on the right 2/20;  the biopsy I did on the patient last week was negative for malignancy. Culture grew Pseudomonas and methicillin sensitive staph aureus. He is on cefdinir 300 twice a day. I will have to make this a 10 day course. He put him in compression. He  Sween Lotion (Moisturizing lotion) 1 x Per Week/15 Days Discharge Instructions: Apply moisturizing lotion as directed Prim Dressing: Hydrofera Blue Ready Transfer Foam, 4x5 (in/in) 1 x Per Week/15 Days ary Discharge Instructions: Apply to wound bed as instructed Secondary Dressing: ABD Pad, 8x10 1 x Per Week/15 Days Discharge Instructions: Apply over primary dressing as directed. Secondary Dressing: Optifoam Non-Adhesive Dressing, 4x4 in 1 x Per Week/15 Days Discharge Instructions: Apply to ankle for protection. Compression Wrap: Urgo K2, (equivalent to a 4 layer) two layer compression system, regular 1 x Per Week/15 Days Discharge Instructions: Apply Urgo K2 as directed (alternative to 4 layer compression). Electronic Signature(s) Signed: 03/10/2023 4:52:49 PM By: Baltazar Najjar MD Signed: 03/10/2023 5:19:03 PM By: Shawn Stall RN, BSN Entered By: Shawn Stall on 03/10/2023 12:04:15 -------------------------------------------------------------------------------- Problem List Details Patient Name: Date of Service: [name]. 03/10/2023 1:45 PM Medical Record Number: [id number] Patient Account Number: [id number] Date of Birth/Sex: Treating RN: 1955-11-29 (67 y.o. [name] Primary Care Provider: Hillard Danker Other Clinician: Referring Provider: Treating Provider/Extender: Shara Blazing in Treatment: 1 Active Problems ICD-10 Encounter Code Description Active Date MDM Diagnosis [phone] Non-pressure chronic ulcer of other part of left lower leg with other specified 03/03/2023 No Yes severity I87.332 Chronic venous hypertension (idiopathic) with ulcer and inflammation of left 03/03/2023 No Yes lower extremity E11.622 Type 2 diabetes mellitus with other skin ulcer 03/03/2023 No Yes [name] ([id number]) [phone].pdf Page 5 of  8 Inactive Problems Resolved Problems Electronic Signature(s) Signed: 03/10/2023 4:52:49 PM By: Baltazar Najjar MD Entered By: Baltazar Najjar on 03/10/2023 12:10:18 -------------------------------------------------------------------------------- Progress Note Details Patient Name: Date of Service: [name]. 03/10/2023 1:45 PM Medical Record Number: [id number] Patient Account Number: [id number] Date of Birth/Sex: Treating RN: Aug 16, 1955 (67 y.o. M) Primary Care Provider: Hillard Danker Other Clinician: Referring Provider: Treating Provider/Extender: Shara Blazing in Treatment: 1 Subjective History of Present Illness (HPI) ADMISSION 07/29/2018 [name] is a 66 year old man with either prediabetes or diabetes he is on glipizide. In late October to November 2019 he noted a scabbed area on the back of his right calf. He picked this off a few times but it would not heal. He saw his primary physician on 12/5. It was felt at that time that this may actually heal on its own with conservative management. The next visit was on 07/20/2018 noted the area was a lot worse and arranged for his treatment here. He has been topical antibiotics like Neosporin although he stopped using this when the wound looked worse. He is just been covering this with a clean Band-Aid. He is given Bactrim a week ago and he is finishing this currently. It is made some improvement in the surrounding erythema per the patient. He does not have a history of nonhealing wounds. No prior history of wounds on his legs that he had difficulty healing. No prior skin issues. 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 Past medical history; A. fib status post ablation and recent cardioversion he is on Xarelto. He has a history of systolic heart failure, cervical radiculopathy, cardiomyopathy and type 2 diabetes as discussed ABI in the right leg was noncompressible on the right 2/20;  the biopsy I did on the patient last week was negative for malignancy. Culture grew Pseudomonas and methicillin sensitive staph aureus. He is on cefdinir 300 twice a day. I will have to make this a 10 day course. He put him in compression. He  Sween Lotion (Moisturizing lotion) 1 x Per Week/15 Days Discharge Instructions: Apply moisturizing lotion as directed Prim Dressing: Hydrofera Blue Ready Transfer Foam, 4x5 (in/in) 1 x Per Week/15 Days ary Discharge Instructions: Apply to wound bed as instructed Secondary Dressing: ABD Pad, 8x10 1 x Per Week/15 Days Discharge Instructions: Apply over primary dressing as directed. Secondary Dressing: Optifoam Non-Adhesive Dressing, 4x4 in 1 x Per Week/15 Days Discharge Instructions: Apply to ankle for protection. Compression Wrap: Urgo K2, (equivalent to a 4 layer) two layer compression system, regular 1 x Per Week/15 Days Discharge Instructions: Apply Urgo K2 as directed (alternative to 4 layer compression). Electronic Signature(s) Signed: 03/10/2023 4:52:49 PM By: Baltazar Najjar MD Signed: 03/10/2023 5:19:03 PM By: Shawn Stall RN, BSN Entered By: Shawn Stall on 03/10/2023 12:04:15 -------------------------------------------------------------------------------- Problem List Details Patient Name: Date of Service: [name]. 03/10/2023 1:45 PM Medical Record Number: [id number] Patient Account Number: [id number] Date of Birth/Sex: Treating RN: 1955-11-29 (67 y.o. [name] Primary Care Provider: Hillard Danker Other Clinician: Referring Provider: Treating Provider/Extender: Shara Blazing in Treatment: 1 Active Problems ICD-10 Encounter Code Description Active Date MDM Diagnosis [phone] Non-pressure chronic ulcer of other part of left lower leg with other specified 03/03/2023 No Yes severity I87.332 Chronic venous hypertension (idiopathic) with ulcer and inflammation of left 03/03/2023 No Yes lower extremity E11.622 Type 2 diabetes mellitus with other skin ulcer 03/03/2023 No Yes [name] ([id number]) [phone].pdf Page 5 of  8 Inactive Problems Resolved Problems Electronic Signature(s) Signed: 03/10/2023 4:52:49 PM By: Baltazar Najjar MD Entered By: Baltazar Najjar on 03/10/2023 12:10:18 -------------------------------------------------------------------------------- Progress Note Details Patient Name: Date of Service: [name]. 03/10/2023 1:45 PM Medical Record Number: [id number] Patient Account Number: [id number] Date of Birth/Sex: Treating RN: Aug 16, 1955 (67 y.o. M) Primary Care Provider: Hillard Danker Other Clinician: Referring Provider: Treating Provider/Extender: Shara Blazing in Treatment: 1 Subjective History of Present Illness (HPI) ADMISSION 07/29/2018 [name] is a 66 year old man with either prediabetes or diabetes he is on glipizide. In late October to November 2019 he noted a scabbed area on the back of his right calf. He picked this off a few times but it would not heal. He saw his primary physician on 12/5. It was felt at that time that this may actually heal on its own with conservative management. The next visit was on 07/20/2018 noted the area was a lot worse and arranged for his treatment here. He has been topical antibiotics like Neosporin although he stopped using this when the wound looked worse. He is just been covering this with a clean Band-Aid. He is given Bactrim a week ago and he is finishing this currently. It is made some improvement in the surrounding erythema per the patient. He does not have a history of nonhealing wounds. No prior history of wounds on his legs that he had difficulty healing. No prior skin issues. 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 Past medical history; A. fib status post ablation and recent cardioversion he is on Xarelto. He has a history of systolic heart failure, cervical radiculopathy, cardiomyopathy and type 2 diabetes as discussed ABI in the right leg was noncompressible on the right 2/20;  the biopsy I did on the patient last week was negative for malignancy. Culture grew Pseudomonas and methicillin sensitive staph aureus. He is on cefdinir 300 twice a day. I will have to make this a 10 day course. He put him in compression. He  Ecchymosis, Mottled, Pallor, Rubor, Erythema. Assessment Active Problems ICD-10 Non-pressure chronic ulcer of other part of left lower leg with other specified severity Chronic venous hypertension (idiopathic) with ulcer and inflammation of left lower extremity Type 2 diabetes mellitus with other skin ulcer Procedures Wound #13 Pre-procedure diagnosis of Wound #13 is a Venous Leg Ulcer located on the Left,Posterior Lower Leg . There was a Double Layer Compression Therapy Procedure by Shawn Stall, RN. Post procedure Diagnosis Wound #13: Same as Pre-Procedure Plan Follow-up Appointments: Return Appointment in 1 week. - Dr Leanord Hawking Tuesday 1430 Room 9 Return Appointment in 2 weeks. - Dr Leanord Hawking Tuesday (front office to book appt) Return appointment in 3 weeks. - Dr Leanord Hawking Tuesday (front office to book appt) Return appointment in 1 month. - Dr Leanord Hawking Tuesday (front office to book appt) Anesthetic: (In clinic) Topical Lidocaine 4% applied to wound bed Bathing/ Shower/ Hygiene: May shower with protection but do not get wound dressing(s) wet. Protect dressing(s) with water repellant cover (for example, large plastic bag) or a cast cover and may then take shower. Edema Control - Lymphedema / SCD / Other: Elevate legs to the level of the heart or above for 30 minutes daily and/or when sitting for 3-4 times a day throughout the day. Avoid standing for long periods of time. PANAV, BERST ([id number]) [id number] Page 8 of 8 Exercise regularly - walking is good for circulation WOUND #13: - Lower Leg Wound Laterality: Left, Posterior Cleanser: Soap and Water 1 x Per Week/15 Days Discharge Instructions: May shower and wash wound  with dial antibacterial soap and water prior to dressing change. Cleanser: Vashe 5.8 (oz) 1 x Per Week/15 Days Discharge Instructions: Cleanse the wound with Vashe prior to applying a clean dressing using gauze sponges, not tissue or cotton balls. Peri-Wound Care: Sween Lotion (Moisturizing lotion) 1 x Per Week/15 Days Discharge Instructions: Apply moisturizing lotion as directed Prim Dressing: Hydrofera Blue Ready Transfer Foam, 4x5 (in/in) 1 x Per Week/15 Days ary Discharge Instructions: Apply to wound bed as instructed Secondary Dressing: ABD Pad, 8x10 1 x Per Week/15 Days Discharge Instructions: Apply over primary dressing as directed. Secondary Dressing: Optifoam Non-Adhesive Dressing, 4x4 in 1 x Per Week/15 Days Discharge Instructions: Apply to ankle for protection. Com pression Wrap: Urgo K2, (equivalent to a 4 layer) two layer compression system, regular 1 x Per Week/15 Days Discharge Instructions: Apply Urgo K2 as directed (alternative to 4 layer compression). 1. No need to change the primary dressing which is Hydrofera Blue. 2. Surface looked a lot better than last week 3. ABDs Urgo K2 wrap Electronic Signature(s) Signed: 03/10/2023 4:52:49 PM By: Baltazar Najjar MD Entered By: Baltazar Najjar on 03/10/2023 12:14:30 -------------------------------------------------------------------------------- SuperBill Details Patient Name: Date of Service: [name] 03/10/2023 Medical Record Number: [id number] Patient Account Number: [id number] Date of Birth/Sex: Treating RN: [birth year] (67 y.o. 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 specified severity I87.332 Chronic venous hypertension (idiopathic) with ulcer and  inflammation of left lower extremity E11.622 Type 2 diabetes mellitus with other skin ulcer Facility Procedures : CPT4 Code: 46962952 Description: (Facility Use Only) [phone] - APPLY MULTLAY COMPRS LWR LT LEG Modifier: Quantity: 1 Physician Procedures : CPT4 Code Description Modifier [id number] - WC PHYS LEVEL 3 - EST PT ICD-10 Diagnosis Description L97.828 Non-pressure chronic ulcer  Ecchymosis, Mottled, Pallor, Rubor, Erythema. Assessment Active Problems ICD-10 Non-pressure chronic ulcer of other part of left lower leg with other specified severity Chronic venous hypertension (idiopathic) with ulcer and inflammation of left lower extremity Type 2 diabetes mellitus with other skin ulcer Procedures Wound #13 Pre-procedure diagnosis of Wound #13 is a Venous Leg Ulcer located on the Left,Posterior Lower Leg . There was a Double Layer Compression Therapy Procedure by Shawn Stall, RN. Post procedure Diagnosis Wound #13: Same as Pre-Procedure Plan Follow-up Appointments: Return Appointment in 1 week. - Dr Leanord Hawking Tuesday 1430 Room 9 Return Appointment in 2 weeks. - Dr Leanord Hawking Tuesday (front office to book appt) Return appointment in 3 weeks. - Dr Leanord Hawking Tuesday (front office to book appt) Return appointment in 1 month. - Dr Leanord Hawking Tuesday (front office to book appt) Anesthetic: (In clinic) Topical Lidocaine 4% applied to wound bed Bathing/ Shower/ Hygiene: May shower with protection but do not get wound dressing(s) wet. Protect dressing(s) with water repellant cover (for example, large plastic bag) or a cast cover and may then take shower. Edema Control - Lymphedema / SCD / Other: Elevate legs to the level of the heart or above for 30 minutes daily and/or when sitting for 3-4 times a day throughout the day. Avoid standing for long periods of time. PANAV, BERST ([id number]) [id number] Page 8 of 8 Exercise regularly - walking is good for circulation WOUND #13: - Lower Leg Wound Laterality: Left, Posterior Cleanser: Soap and Water 1 x Per Week/15 Days Discharge Instructions: May shower and wash wound  with dial antibacterial soap and water prior to dressing change. Cleanser: Vashe 5.8 (oz) 1 x Per Week/15 Days Discharge Instructions: Cleanse the wound with Vashe prior to applying a clean dressing using gauze sponges, not tissue or cotton balls. Peri-Wound Care: Sween Lotion (Moisturizing lotion) 1 x Per Week/15 Days Discharge Instructions: Apply moisturizing lotion as directed Prim Dressing: Hydrofera Blue Ready Transfer Foam, 4x5 (in/in) 1 x Per Week/15 Days ary Discharge Instructions: Apply to wound bed as instructed Secondary Dressing: ABD Pad, 8x10 1 x Per Week/15 Days Discharge Instructions: Apply over primary dressing as directed. Secondary Dressing: Optifoam Non-Adhesive Dressing, 4x4 in 1 x Per Week/15 Days Discharge Instructions: Apply to ankle for protection. Com pression Wrap: Urgo K2, (equivalent to a 4 layer) two layer compression system, regular 1 x Per Week/15 Days Discharge Instructions: Apply Urgo K2 as directed (alternative to 4 layer compression). 1. No need to change the primary dressing which is Hydrofera Blue. 2. Surface looked a lot better than last week 3. ABDs Urgo K2 wrap Electronic Signature(s) Signed: 03/10/2023 4:52:49 PM By: Baltazar Najjar MD Entered By: Baltazar Najjar on 03/10/2023 12:14:30 -------------------------------------------------------------------------------- SuperBill Details Patient Name: Date of Service: [name] 03/10/2023 Medical Record Number: [id number] Patient Account Number: [id number] Date of Birth/Sex: Treating RN: [birth year] (67 y.o. 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 specified severity I87.332 Chronic venous hypertension (idiopathic) with ulcer and  inflammation of left lower extremity E11.622 Type 2 diabetes mellitus with other skin ulcer Facility Procedures : CPT4 Code: 46962952 Description: (Facility Use Only) [phone] - APPLY MULTLAY COMPRS LWR LT LEG Modifier: Quantity: 1 Physician Procedures : CPT4 Code Description Modifier [id number] - WC PHYS LEVEL 3 - EST PT ICD-10 Diagnosis Description L97.828 Non-pressure chronic ulcer  Sween Lotion (Moisturizing lotion) 1 x Per Week/15 Days Discharge Instructions: Apply moisturizing lotion as directed Prim Dressing: Hydrofera Blue Ready Transfer Foam, 4x5 (in/in) 1 x Per Week/15 Days ary Discharge Instructions: Apply to wound bed as instructed Secondary Dressing: ABD Pad, 8x10 1 x Per Week/15 Days Discharge Instructions: Apply over primary dressing as directed. Secondary Dressing: Optifoam Non-Adhesive Dressing, 4x4 in 1 x Per Week/15 Days Discharge Instructions: Apply to ankle for protection. Compression Wrap: Urgo K2, (equivalent to a 4 layer) two layer compression system, regular 1 x Per Week/15 Days Discharge Instructions: Apply Urgo K2 as directed (alternative to 4 layer compression). Electronic Signature(s) Signed: 03/10/2023 4:52:49 PM By: Baltazar Najjar MD Signed: 03/10/2023 5:19:03 PM By: Shawn Stall RN, BSN Entered By: Shawn Stall on 03/10/2023 12:04:15 -------------------------------------------------------------------------------- Problem List Details Patient Name: Date of Service: [name]. 03/10/2023 1:45 PM Medical Record Number: [id number] Patient Account Number: [id number] Date of Birth/Sex: Treating RN: 1955-11-29 (67 y.o. [name] Primary Care Provider: Hillard Danker Other Clinician: Referring Provider: Treating Provider/Extender: Shara Blazing in Treatment: 1 Active Problems ICD-10 Encounter Code Description Active Date MDM Diagnosis [phone] Non-pressure chronic ulcer of other part of left lower leg with other specified 03/03/2023 No Yes severity I87.332 Chronic venous hypertension (idiopathic) with ulcer and inflammation of left 03/03/2023 No Yes lower extremity E11.622 Type 2 diabetes mellitus with other skin ulcer 03/03/2023 No Yes [name] ([id number]) [phone].pdf Page 5 of  8 Inactive Problems Resolved Problems Electronic Signature(s) Signed: 03/10/2023 4:52:49 PM By: Baltazar Najjar MD Entered By: Baltazar Najjar on 03/10/2023 12:10:18 -------------------------------------------------------------------------------- Progress Note Details Patient Name: Date of Service: [name]. 03/10/2023 1:45 PM Medical Record Number: [id number] Patient Account Number: [id number] Date of Birth/Sex: Treating RN: Aug 16, 1955 (67 y.o. M) Primary Care Provider: Hillard Danker Other Clinician: Referring Provider: Treating Provider/Extender: Shara Blazing in Treatment: 1 Subjective History of Present Illness (HPI) ADMISSION 07/29/2018 [name] is a 66 year old man with either prediabetes or diabetes he is on glipizide. In late October to November 2019 he noted a scabbed area on the back of his right calf. He picked this off a few times but it would not heal. He saw his primary physician on 12/5. It was felt at that time that this may actually heal on its own with conservative management. The next visit was on 07/20/2018 noted the area was a lot worse and arranged for his treatment here. He has been topical antibiotics like Neosporin although he stopped using this when the wound looked worse. He is just been covering this with a clean Band-Aid. He is given Bactrim a week ago and he is finishing this currently. It is made some improvement in the surrounding erythema per the patient. He does not have a history of nonhealing wounds. No prior history of wounds on his legs that he had difficulty healing. No prior skin issues. 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 Past medical history; A. fib status post ablation and recent cardioversion he is on Xarelto. He has a history of systolic heart failure, cervical radiculopathy, cardiomyopathy and type 2 diabetes as discussed ABI in the right leg was noncompressible on the right 2/20;  the biopsy I did on the patient last week was negative for malignancy. Culture grew Pseudomonas and methicillin sensitive staph aureus. He is on cefdinir 300 twice a day. I will have to make this a 10 day course. He put him in compression. He  Sween Lotion (Moisturizing lotion) 1 x Per Week/15 Days Discharge Instructions: Apply moisturizing lotion as directed Prim Dressing: Hydrofera Blue Ready Transfer Foam, 4x5 (in/in) 1 x Per Week/15 Days ary Discharge Instructions: Apply to wound bed as instructed Secondary Dressing: ABD Pad, 8x10 1 x Per Week/15 Days Discharge Instructions: Apply over primary dressing as directed. Secondary Dressing: Optifoam Non-Adhesive Dressing, 4x4 in 1 x Per Week/15 Days Discharge Instructions: Apply to ankle for protection. Compression Wrap: Urgo K2, (equivalent to a 4 layer) two layer compression system, regular 1 x Per Week/15 Days Discharge Instructions: Apply Urgo K2 as directed (alternative to 4 layer compression). Electronic Signature(s) Signed: 03/10/2023 4:52:49 PM By: Baltazar Najjar MD Signed: 03/10/2023 5:19:03 PM By: Shawn Stall RN, BSN Entered By: Shawn Stall on 03/10/2023 12:04:15 -------------------------------------------------------------------------------- Problem List Details Patient Name: Date of Service: [name]. 03/10/2023 1:45 PM Medical Record Number: [id number] Patient Account Number: [id number] Date of Birth/Sex: Treating RN: 1955-11-29 (67 y.o. [name] Primary Care Provider: Hillard Danker Other Clinician: Referring Provider: Treating Provider/Extender: Shara Blazing in Treatment: 1 Active Problems ICD-10 Encounter Code Description Active Date MDM Diagnosis [phone] Non-pressure chronic ulcer of other part of left lower leg with other specified 03/03/2023 No Yes severity I87.332 Chronic venous hypertension (idiopathic) with ulcer and inflammation of left 03/03/2023 No Yes lower extremity E11.622 Type 2 diabetes mellitus with other skin ulcer 03/03/2023 No Yes [name] ([id number]) [phone].pdf Page 5 of  8 Inactive Problems Resolved Problems Electronic Signature(s) Signed: 03/10/2023 4:52:49 PM By: Baltazar Najjar MD Entered By: Baltazar Najjar on 03/10/2023 12:10:18 -------------------------------------------------------------------------------- Progress Note Details Patient Name: Date of Service: [name]. 03/10/2023 1:45 PM Medical Record Number: [id number] Patient Account Number: [id number] Date of Birth/Sex: Treating RN: Aug 16, 1955 (67 y.o. M) Primary Care Provider: Hillard Danker Other Clinician: Referring Provider: Treating Provider/Extender: Shara Blazing in Treatment: 1 Subjective History of Present Illness (HPI) ADMISSION 07/29/2018 [name] is a 66 year old man with either prediabetes or diabetes he is on glipizide. In late October to November 2019 he noted a scabbed area on the back of his right calf. He picked this off a few times but it would not heal. He saw his primary physician on 12/5. It was felt at that time that this may actually heal on its own with conservative management. The next visit was on 07/20/2018 noted the area was a lot worse and arranged for his treatment here. He has been topical antibiotics like Neosporin although he stopped using this when the wound looked worse. He is just been covering this with a clean Band-Aid. He is given Bactrim a week ago and he is finishing this currently. It is made some improvement in the surrounding erythema per the patient. He does not have a history of nonhealing wounds. No prior history of wounds on his legs that he had difficulty healing. No prior skin issues. 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 Past medical history; A. fib status post ablation and recent cardioversion he is on Xarelto. He has a history of systolic heart failure, cervical radiculopathy, cardiomyopathy and type 2 diabetes as discussed ABI in the right leg was noncompressible on the right 2/20;  the biopsy I did on the patient last week was negative for malignancy. Culture grew Pseudomonas and methicillin sensitive staph aureus. He is on cefdinir 300 twice a day. I will have to make this a 10 day course. He put him in compression. He  Sween Lotion (Moisturizing lotion) 1 x Per Week/15 Days Discharge Instructions: Apply moisturizing lotion as directed Prim Dressing: Hydrofera Blue Ready Transfer Foam, 4x5 (in/in) 1 x Per Week/15 Days ary Discharge Instructions: Apply to wound bed as instructed Secondary Dressing: ABD Pad, 8x10 1 x Per Week/15 Days Discharge Instructions: Apply over primary dressing as directed. Secondary Dressing: Optifoam Non-Adhesive Dressing, 4x4 in 1 x Per Week/15 Days Discharge Instructions: Apply to ankle for protection. Compression Wrap: Urgo K2, (equivalent to a 4 layer) two layer compression system, regular 1 x Per Week/15 Days Discharge Instructions: Apply Urgo K2 as directed (alternative to 4 layer compression). Electronic Signature(s) Signed: 03/10/2023 4:52:49 PM By: Baltazar Najjar MD Signed: 03/10/2023 5:19:03 PM By: Shawn Stall RN, BSN Entered By: Shawn Stall on 03/10/2023 12:04:15 -------------------------------------------------------------------------------- Problem List Details Patient Name: Date of Service: [name]. 03/10/2023 1:45 PM Medical Record Number: [id number] Patient Account Number: [id number] Date of Birth/Sex: Treating RN: 1955-11-29 (67 y.o. [name] Primary Care Provider: Hillard Danker Other Clinician: Referring Provider: Treating Provider/Extender: Shara Blazing in Treatment: 1 Active Problems ICD-10 Encounter Code Description Active Date MDM Diagnosis [phone] Non-pressure chronic ulcer of other part of left lower leg with other specified 03/03/2023 No Yes severity I87.332 Chronic venous hypertension (idiopathic) with ulcer and inflammation of left 03/03/2023 No Yes lower extremity E11.622 Type 2 diabetes mellitus with other skin ulcer 03/03/2023 No Yes [name] ([id number]) [phone].pdf Page 5 of  8 Inactive Problems Resolved Problems Electronic Signature(s) Signed: 03/10/2023 4:52:49 PM By: Baltazar Najjar MD Entered By: Baltazar Najjar on 03/10/2023 12:10:18 -------------------------------------------------------------------------------- Progress Note Details Patient Name: Date of Service: [name]. 03/10/2023 1:45 PM Medical Record Number: [id number] Patient Account Number: [id number] Date of Birth/Sex: Treating RN: Aug 16, 1955 (67 y.o. M) Primary Care Provider: Hillard Danker Other Clinician: Referring Provider: Treating Provider/Extender: Shara Blazing in Treatment: 1 Subjective History of Present Illness (HPI) ADMISSION 07/29/2018 [name] is a 66 year old man with either prediabetes or diabetes he is on glipizide. In late October to November 2019 he noted a scabbed area on the back of his right calf. He picked this off a few times but it would not heal. He saw his primary physician on 12/5. It was felt at that time that this may actually heal on its own with conservative management. The next visit was on 07/20/2018 noted the area was a lot worse and arranged for his treatment here. He has been topical antibiotics like Neosporin although he stopped using this when the wound looked worse. He is just been covering this with a clean Band-Aid. He is given Bactrim a week ago and he is finishing this currently. It is made some improvement in the surrounding erythema per the patient. He does not have a history of nonhealing wounds. No prior history of wounds on his legs that he had difficulty healing. No prior skin issues. 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 Past medical history; A. fib status post ablation and recent cardioversion he is on Xarelto. He has a history of systolic heart failure, cervical radiculopathy, cardiomyopathy and type 2 diabetes as discussed ABI in the right leg was noncompressible on the right 2/20;  the biopsy I did on the patient last week was negative for malignancy. Culture grew Pseudomonas and methicillin sensitive staph aureus. He is on cefdinir 300 twice a day. I will have to make this a 10 day course. He put him in compression. He  Ecchymosis, Mottled, Pallor, Rubor, Erythema. Assessment Active Problems ICD-10 Non-pressure chronic ulcer of other part of left lower leg with other specified severity Chronic venous hypertension (idiopathic) with ulcer and inflammation of left lower extremity Type 2 diabetes mellitus with other skin ulcer Procedures Wound #13 Pre-procedure diagnosis of Wound #13 is a Venous Leg Ulcer located on the Left,Posterior Lower Leg . There was a Double Layer Compression Therapy Procedure by Shawn Stall, RN. Post procedure Diagnosis Wound #13: Same as Pre-Procedure Plan Follow-up Appointments: Return Appointment in 1 week. - Dr Leanord Hawking Tuesday 1430 Room 9 Return Appointment in 2 weeks. - Dr Leanord Hawking Tuesday (front office to book appt) Return appointment in 3 weeks. - Dr Leanord Hawking Tuesday (front office to book appt) Return appointment in 1 month. - Dr Leanord Hawking Tuesday (front office to book appt) Anesthetic: (In clinic) Topical Lidocaine 4% applied to wound bed Bathing/ Shower/ Hygiene: May shower with protection but do not get wound dressing(s) wet. Protect dressing(s) with water repellant cover (for example, large plastic bag) or a cast cover and may then take shower. Edema Control - Lymphedema / SCD / Other: Elevate legs to the level of the heart or above for 30 minutes daily and/or when sitting for 3-4 times a day throughout the day. Avoid standing for long periods of time. PANAV, BERST ([id number]) [id number] Page 8 of 8 Exercise regularly - walking is good for circulation WOUND #13: - Lower Leg Wound Laterality: Left, Posterior Cleanser: Soap and Water 1 x Per Week/15 Days Discharge Instructions: May shower and wash wound  with dial antibacterial soap and water prior to dressing change. Cleanser: Vashe 5.8 (oz) 1 x Per Week/15 Days Discharge Instructions: Cleanse the wound with Vashe prior to applying a clean dressing using gauze sponges, not tissue or cotton balls. Peri-Wound Care: Sween Lotion (Moisturizing lotion) 1 x Per Week/15 Days Discharge Instructions: Apply moisturizing lotion as directed Prim Dressing: Hydrofera Blue Ready Transfer Foam, 4x5 (in/in) 1 x Per Week/15 Days ary Discharge Instructions: Apply to wound bed as instructed Secondary Dressing: ABD Pad, 8x10 1 x Per Week/15 Days Discharge Instructions: Apply over primary dressing as directed. Secondary Dressing: Optifoam Non-Adhesive Dressing, 4x4 in 1 x Per Week/15 Days Discharge Instructions: Apply to ankle for protection. Com pression Wrap: Urgo K2, (equivalent to a 4 layer) two layer compression system, regular 1 x Per Week/15 Days Discharge Instructions: Apply Urgo K2 as directed (alternative to 4 layer compression). 1. No need to change the primary dressing which is Hydrofera Blue. 2. Surface looked a lot better than last week 3. ABDs Urgo K2 wrap Electronic Signature(s) Signed: 03/10/2023 4:52:49 PM By: Baltazar Najjar MD Entered By: Baltazar Najjar on 03/10/2023 12:14:30 -------------------------------------------------------------------------------- SuperBill Details Patient Name: Date of Service: [name] 03/10/2023 Medical Record Number: [id number] Patient Account Number: [id number] Date of Birth/Sex: Treating RN: [birth year] (67 y.o. 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 specified severity I87.332 Chronic venous hypertension (idiopathic) with ulcer and  inflammation of left lower extremity E11.622 Type 2 diabetes mellitus with other skin ulcer Facility Procedures : CPT4 Code: [id number] Description: (Facility Use Only) [phone] - APPLY MULTLAY COMPRS LWR LT LEG Modifier: Quantity: 1 Physician Procedures : CPT4 Code Description Modifier [id number] - WC PHYS LEVEL 3 - EST PT ICD-10 Diagnosis Description L97.828 Non-pressure chronic ulcer  Sween Lotion (Moisturizing lotion) 1 x Per Week/15 Days Discharge Instructions: Apply moisturizing lotion as directed Prim Dressing: Hydrofera Blue Ready Transfer Foam, 4x5 (in/in) 1 x Per Week/15 Days ary Discharge Instructions: Apply to wound bed as instructed Secondary Dressing: ABD Pad, 8x10 1 x Per Week/15 Days Discharge Instructions: Apply over primary dressing as directed. Secondary Dressing: Optifoam Non-Adhesive Dressing, 4x4 in 1 x Per Week/15 Days Discharge Instructions: Apply to ankle for protection. Compression Wrap: Urgo K2, (equivalent to a 4 layer) two layer compression system, regular 1 x Per Week/15 Days Discharge Instructions: Apply Urgo K2 as directed (alternative to 4 layer compression). Electronic Signature(s) Signed: 03/10/2023 4:52:49 PM By: Baltazar Najjar MD Signed: 03/10/2023 5:19:03 PM By: Shawn Stall RN, BSN Entered By: Shawn Stall on 03/10/2023 12:04:15 -------------------------------------------------------------------------------- Problem List Details Patient Name: Date of Service: [name]. 03/10/2023 1:45 PM Medical Record Number: [id number] Patient Account Number: [id number] Date of Birth/Sex: Treating RN: 1955-11-29 (67 y.o. [name] Primary Care Provider: Hillard Danker Other Clinician: Referring Provider: Treating Provider/Extender: Shara Blazing in Treatment: 1 Active Problems ICD-10 Encounter Code Description Active Date MDM Diagnosis [phone] Non-pressure chronic ulcer of other part of left lower leg with other specified 03/03/2023 No Yes severity I87.332 Chronic venous hypertension (idiopathic) with ulcer and inflammation of left 03/03/2023 No Yes lower extremity E11.622 Type 2 diabetes mellitus with other skin ulcer 03/03/2023 No Yes [name] ([id number]) [phone].pdf Page 5 of  8 Inactive Problems Resolved Problems Electronic Signature(s) Signed: 03/10/2023 4:52:49 PM By: Baltazar Najjar MD Entered By: Baltazar Najjar on 03/10/2023 12:10:18 -------------------------------------------------------------------------------- Progress Note Details Patient Name: Date of Service: [name]. 03/10/2023 1:45 PM Medical Record Number: [id number] Patient Account Number: [id number] Date of Birth/Sex: Treating RN: Aug 16, 1955 (67 y.o. M) Primary Care Provider: Hillard Danker Other Clinician: Referring Provider: Treating Provider/Extender: Shara Blazing in Treatment: 1 Subjective History of Present Illness (HPI) ADMISSION 07/29/2018 [name] is a 66 year old man with either prediabetes or diabetes he is on glipizide. In late October to November 2019 he noted a scabbed area on the back of his right calf. He picked this off a few times but it would not heal. He saw his primary physician on 12/5. It was felt at that time that this may actually heal on its own with conservative management. The next visit was on 07/20/2018 noted the area was a lot worse and arranged for his treatment here. He has been topical antibiotics like Neosporin although he stopped using this when the wound looked worse. He is just been covering this with a clean Band-Aid. He is given Bactrim a week ago and he is finishing this currently. It is made some improvement in the surrounding erythema per the patient. He does not have a history of nonhealing wounds. No prior history of wounds on his legs that he had difficulty healing. No prior skin issues. 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 Past medical history; A. fib status post ablation and recent cardioversion he is on Xarelto. He has a history of systolic heart failure, cervical radiculopathy, cardiomyopathy and type 2 diabetes as discussed ABI in the right leg was noncompressible on the right 2/20;  the biopsy I did on the patient last week was negative for malignancy. Culture grew Pseudomonas and methicillin sensitive staph aureus. He is on cefdinir 300 twice a day. I will have to make this a 10 day course. He put him in compression. He  Ecchymosis, Mottled, Pallor, Rubor, Erythema. Assessment Active Problems ICD-10 Non-pressure chronic ulcer of other part of left lower leg with other specified severity Chronic venous hypertension (idiopathic) with ulcer and inflammation of left lower extremity Type 2 diabetes mellitus with other skin ulcer Procedures Wound #13 Pre-procedure diagnosis of Wound #13 is a Venous Leg Ulcer located on the Left,Posterior Lower Leg . There was a Double Layer Compression Therapy Procedure by Shawn Stall, RN. Post procedure Diagnosis Wound #13: Same as Pre-Procedure Plan Follow-up Appointments: Return Appointment in 1 week. - Dr Leanord Hawking Tuesday 1430 Room 9 Return Appointment in 2 weeks. - Dr Leanord Hawking Tuesday (front office to book appt) Return appointment in 3 weeks. - Dr Leanord Hawking Tuesday (front office to book appt) Return appointment in 1 month. - Dr Leanord Hawking Tuesday (front office to book appt) Anesthetic: (In clinic) Topical Lidocaine 4% applied to wound bed Bathing/ Shower/ Hygiene: May shower with protection but do not get wound dressing(s) wet. Protect dressing(s) with water repellant cover (for example, large plastic bag) or a cast cover and may then take shower. Edema Control - Lymphedema / SCD / Other: Elevate legs to the level of the heart or above for 30 minutes daily and/or when sitting for 3-4 times a day throughout the day. Avoid standing for long periods of time. PANAV, BERST ([id number]) [id number] Page 8 of 8 Exercise regularly - walking is good for circulation WOUND #13: - Lower Leg Wound Laterality: Left, Posterior Cleanser: Soap and Water 1 x Per Week/15 Days Discharge Instructions: May shower and wash wound  with dial antibacterial soap and water prior to dressing change. Cleanser: Vashe 5.8 (oz) 1 x Per Week/15 Days Discharge Instructions: Cleanse the wound with Vashe prior to applying a clean dressing using gauze sponges, not tissue or cotton balls. Peri-Wound Care: Sween Lotion (Moisturizing lotion) 1 x Per Week/15 Days Discharge Instructions: Apply moisturizing lotion as directed Prim Dressing: Hydrofera Blue Ready Transfer Foam, 4x5 (in/in) 1 x Per Week/15 Days ary Discharge Instructions: Apply to wound bed as instructed Secondary Dressing: ABD Pad, 8x10 1 x Per Week/15 Days Discharge Instructions: Apply over primary dressing as directed. Secondary Dressing: Optifoam Non-Adhesive Dressing, 4x4 in 1 x Per Week/15 Days Discharge Instructions: Apply to ankle for protection. Com pression Wrap: Urgo K2, (equivalent to a 4 layer) two layer compression system, regular 1 x Per Week/15 Days Discharge Instructions: Apply Urgo K2 as directed (alternative to 4 layer compression). 1. No need to change the primary dressing which is Hydrofera Blue. 2. Surface looked a lot better than last week 3. ABDs Urgo K2 wrap Electronic Signature(s) Signed: 03/10/2023 4:52:49 PM By: Baltazar Najjar MD Entered By: Baltazar Najjar on 03/10/2023 12:14:30 -------------------------------------------------------------------------------- SuperBill Details Patient Name: Date of Service: [name] 03/10/2023 Medical Record Number: [id number] Patient Account Number: [id number] Date of Birth/Sex: Treating RN: [birth year] (67 y.o. 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 specified severity I87.332 Chronic venous hypertension (idiopathic) with ulcer and  inflammation of left lower extremity E11.622 Type 2 diabetes mellitus with other skin ulcer Facility Procedures : CPT4 Code: 46962952 Description: (Facility Use Only) [phone] - APPLY MULTLAY COMPRS LWR LT LEG Modifier: Quantity: 1 Physician Procedures : CPT4 Code Description Modifier [id number] - WC PHYS LEVEL 3 - EST PT ICD-10 Diagnosis Description L97.828 Non-pressure chronic ulcer  Sween Lotion (Moisturizing lotion) 1 x Per Week/15 Days Discharge Instructions: Apply moisturizing lotion as directed Prim Dressing: Hydrofera Blue Ready Transfer Foam, 4x5 (in/in) 1 x Per Week/15 Days ary Discharge Instructions: Apply to wound bed as instructed Secondary Dressing: ABD Pad, 8x10 1 x Per Week/15 Days Discharge Instructions: Apply over primary dressing as directed. Secondary Dressing: Optifoam Non-Adhesive Dressing, 4x4 in 1 x Per Week/15 Days Discharge Instructions: Apply to ankle for protection. Compression Wrap: Urgo K2, (equivalent to a 4 layer) two layer compression system, regular 1 x Per Week/15 Days Discharge Instructions: Apply Urgo K2 as directed (alternative to 4 layer compression). Electronic Signature(s) Signed: 03/10/2023 4:52:49 PM By: Baltazar Najjar MD Signed: 03/10/2023 5:19:03 PM By: Shawn Stall RN, BSN Entered By: Shawn Stall on 03/10/2023 12:04:15 -------------------------------------------------------------------------------- Problem List Details Patient Name: Date of Service: [name]. 03/10/2023 1:45 PM Medical Record Number: [id number] Patient Account Number: [id number] Date of Birth/Sex: Treating RN: 1955-11-29 (67 y.o. [name] Primary Care Provider: Hillard Danker Other Clinician: Referring Provider: Treating Provider/Extender: Shara Blazing in Treatment: 1 Active Problems ICD-10 Encounter Code Description Active Date MDM Diagnosis [phone] Non-pressure chronic ulcer of other part of left lower leg with other specified 03/03/2023 No Yes severity I87.332 Chronic venous hypertension (idiopathic) with ulcer and inflammation of left 03/03/2023 No Yes lower extremity E11.622 Type 2 diabetes mellitus with other skin ulcer 03/03/2023 No Yes [name] ([id number]) [phone].pdf Page 5 of  8 Inactive Problems Resolved Problems Electronic Signature(s) Signed: 03/10/2023 4:52:49 PM By: Baltazar Najjar MD Entered By: Baltazar Najjar on 03/10/2023 12:10:18 -------------------------------------------------------------------------------- Progress Note Details Patient Name: Date of Service: [name]. 03/10/2023 1:45 PM Medical Record Number: [id number] Patient Account Number: [id number] Date of Birth/Sex: Treating RN: Aug 16, 1955 (67 y.o. M) Primary Care Provider: Hillard Danker Other Clinician: Referring Provider: Treating Provider/Extender: Shara Blazing in Treatment: 1 Subjective History of Present Illness (HPI) ADMISSION 07/29/2018 [name] is a 66 year old man with either prediabetes or diabetes he is on glipizide. In late October to November 2019 he noted a scabbed area on the back of his right calf. He picked this off a few times but it would not heal. He saw his primary physician on 12/5. It was felt at that time that this may actually heal on its own with conservative management. The next visit was on 07/20/2018 noted the area was a lot worse and arranged for his treatment here. He has been topical antibiotics like Neosporin although he stopped using this when the wound looked worse. He is just been covering this with a clean Band-Aid. He is given Bactrim a week ago and he is finishing this currently. It is made some improvement in the surrounding erythema per the patient. He does not have a history of nonhealing wounds. No prior history of wounds on his legs that he had difficulty healing. No prior skin issues. 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 Past medical history; A. fib status post ablation and recent cardioversion he is on Xarelto. He has a history of systolic heart failure, cervical radiculopathy, cardiomyopathy and type 2 diabetes as discussed ABI in the right leg was noncompressible on the right 2/20;  the biopsy I did on the patient last week was negative for malignancy. Culture grew Pseudomonas and methicillin sensitive staph aureus. He is on cefdinir 300 twice a day. I will have to make this a 10 day course. He put him in compression. He  Sween Lotion (Moisturizing lotion) 1 x Per Week/15 Days Discharge Instructions: Apply moisturizing lotion as directed Prim Dressing: Hydrofera Blue Ready Transfer Foam, 4x5 (in/in) 1 x Per Week/15 Days ary Discharge Instructions: Apply to wound bed as instructed Secondary Dressing: ABD Pad, 8x10 1 x Per Week/15 Days Discharge Instructions: Apply over primary dressing as directed. Secondary Dressing: Optifoam Non-Adhesive Dressing, 4x4 in 1 x Per Week/15 Days Discharge Instructions: Apply to ankle for protection. Compression Wrap: Urgo K2, (equivalent to a 4 layer) two layer compression system, regular 1 x Per Week/15 Days Discharge Instructions: Apply Urgo K2 as directed (alternative to 4 layer compression). Electronic Signature(s) Signed: 03/10/2023 4:52:49 PM By: Baltazar Najjar MD Signed: 03/10/2023 5:19:03 PM By: Shawn Stall RN, BSN Entered By: Shawn Stall on 03/10/2023 12:04:15 -------------------------------------------------------------------------------- Problem List Details Patient Name: Date of Service: [name]. 03/10/2023 1:45 PM Medical Record Number: [id number] Patient Account Number: [id number] Date of Birth/Sex: Treating RN: 1955-11-29 (67 y.o. [name] Primary Care Provider: Hillard Danker Other Clinician: Referring Provider: Treating Provider/Extender: Shara Blazing in Treatment: 1 Active Problems ICD-10 Encounter Code Description Active Date MDM Diagnosis [phone] Non-pressure chronic ulcer of other part of left lower leg with other specified 03/03/2023 No Yes severity I87.332 Chronic venous hypertension (idiopathic) with ulcer and inflammation of left 03/03/2023 No Yes lower extremity E11.622 Type 2 diabetes mellitus with other skin ulcer 03/03/2023 No Yes [name] ([id number]) [phone].pdf Page 5 of  8 Inactive Problems Resolved Problems Electronic Signature(s) Signed: 03/10/2023 4:52:49 PM By: Baltazar Najjar MD Entered By: Baltazar Najjar on 03/10/2023 12:10:18 -------------------------------------------------------------------------------- Progress Note Details Patient Name: Date of Service: [name]. 03/10/2023 1:45 PM Medical Record Number: [id number] Patient Account Number: [id number] Date of Birth/Sex: Treating RN: Aug 16, 1955 (67 y.o. M) Primary Care Provider: Hillard Danker Other Clinician: Referring Provider: Treating Provider/Extender: Shara Blazing in Treatment: 1 Subjective History of Present Illness (HPI) ADMISSION 07/29/2018 [name] is a 66 year old man with either prediabetes or diabetes he is on glipizide. In late October to November 2019 he noted a scabbed area on the back of his right calf. He picked this off a few times but it would not heal. He saw his primary physician on 12/5. It was felt at that time that this may actually heal on its own with conservative management. The next visit was on 07/20/2018 noted the area was a lot worse and arranged for his treatment here. He has been topical antibiotics like Neosporin although he stopped using this when the wound looked worse. He is just been covering this with a clean Band-Aid. He is given Bactrim a week ago and he is finishing this currently. It is made some improvement in the surrounding erythema per the patient. He does not have a history of nonhealing wounds. No prior history of wounds on his legs that he had difficulty healing. No prior skin issues. 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 Past medical history; A. fib status post ablation and recent cardioversion he is on Xarelto. He has a history of systolic heart failure, cervical radiculopathy, cardiomyopathy and type 2 diabetes as discussed ABI in the right leg was noncompressible on the right 2/20;  the biopsy I did on the patient last week was negative for malignancy. Culture grew Pseudomonas and methicillin sensitive staph aureus. He is on cefdinir 300 twice a day. I will have to make this a 10 day course. He put him in compression. He

## 2023-03-11 NOTE — Progress Notes (Signed)
Account Number: [id number] Date of Birth/Sex: Treating RN: 10/17/1955 (67 y.o. [name], [name] Primary Care Sameer Teeple: Hillard Danker Other Clinician: Haywood Pao Referring Farida Mcreynolds: Treating Ata Pecha/Extender: Shara Blazing in Treatment: 1 Vital Signs Time Taken: 14:23 Temperature (F): 97.8 Height (in): 73 Pulse (bpm): 62 Source: Stated Respiratory Rate (breaths/min): 18 Weight (lbs): 254 Blood Pressure (mmHg): 118/78 Source: Stated Capillary Blood Glucose (mg/dl): 951 Body Mass Index (BMI): 33.5 Reference Range: 80 - 120 mg / dl Electronic Signature(s) Signed: 03/10/2023 6:37:51 PM By: Haywood Pao CHT EMT BS , , Entered By: Haywood Pao on 03/10/2023 14:56:21  Pain Level: 0 Tolerable Pain Level: 8 Pain Management and Medication Current Pain Management: Electronic Signature(s) Signed: 03/10/2023 5:19:03 PM By: Shawn Stall RN, BSN Signed: 03/10/2023 6:37:51 PM By: Haywood Pao CHT EMT BS , , Entered By: Haywood Pao on 03/10/2023 14:56:30 -------------------------------------------------------------------------------- Patient/Caregiver Education Details Patient Name: Date of Service: [name] 9/24/2024andnbsp1:45 PM Medical Record  Number: [id number] Patient Account Number: [id number] Date of Birth/Gender: Treating RN: 1955-11-13 (67 y.o. [name] Primary Care Physician: Hillard Danker Other Clinician: Referring Physician: Treating Physician/Extender: Shara Blazing in Treatment: 1 Education Assessment Education Provided To: Patient Education Topics Provided Wound/Skin Impairment: Handouts: Caring for Your Ulcer Methods: Explain/Verbal Responses: Reinforcements needed Electronic Signature(s) Signed: 03/10/2023 5:19:03 PM By: Shawn Stall RN, BSN Entered By: Shawn Stall on 03/10/2023 14:48:58 -------------------------------------------------------------------------------- Wound Assessment Details Patient Name: Date of Service: [name] Alpers, [name] 03/10/2023 1:45 PM [name] ([id number]) [id number] Page 6 of 7 Medical Record Number: [id number] Patient Account Number: [id number] Date of Birth/Sex: Treating RN: [birth year] (67 y.o. M) Primary Care Jacqueli Pangallo: Hillard Danker Other Clinician: Referring Dejanee Thibeaux: Treating Briannah Lona/Extender: Shara Blazing in Treatment: 1 Wound Status Wound Number: 13 Primary Venous Leg Ulcer Etiology: Wound Location: Left, Posterior Lower Leg Secondary Diabetic Wound/Ulcer of the Lower Extremity Wounding Event: Blister Etiology: Date Acquired: 01/20/2023 Wound Open Weeks Of Treatment: 1 Status: Clustered Wound: No Comorbid Asthma, Sleep Apnea, Arrhythmia, Congestive Heart Failure, History: Hypertension, Type II Diabetes Photos Wound Measurements Length: (cm) 4.6 Width: (cm) 3.5 Depth: (cm) 0.2 Area: (cm) 12.645 Volume: (cm) 2.529 % Reduction in Area: 35% % Reduction in Volume: 35% Epithelialization: None Wound Description Classification: Full Thickness Without Exposed Suppor Wound Margin: Distinct, outline attached Exudate Amount: Medium Exudate Type:  Serosanguineous Exudate Color: red, brown t Structures Foul Odor After Cleansing: No Slough/Fibrino Yes Wound Bed Granulation Amount: Large (67-100%) Exposed Structure Granulation Quality: Red, Pink, Friable Fascia Exposed: No Necrotic Amount: Small (1-33%) Fat Layer (Subcutaneous Tissue) Exposed: Yes Necrotic Quality: Adherent Slough Tendon Exposed: No Muscle Exposed: No Joint Exposed: No Bone Exposed: No Periwound Skin Texture Texture Color No Abnormalities Noted: No No Abnormalities Noted: No Callus: No Atrophie Blanche: No Crepitus: No Cyanosis: No Excoriation: No Ecchymosis: No Induration: No Erythema: No Rash: No Hemosiderin Staining: Yes Scarring: No Mottled: No Pallor: No Moisture Rubor: No No Abnormalities Noted: No Dry / Scaly: No Maceration: No Treatment Notes Wound #13 (Lower Leg) Wound Laterality: Left, Posterior Cleanser Soap and Water Discharge Instruction: May shower and wash wound with dial antibacterial soap and water prior to dressing change. [name], [name] ([id number]) [id number] Page 7 of 7 Vashe 5.8 (oz) Discharge Instruction: Cleanse the wound with Vashe prior to applying a clean dressing using gauze sponges, not tissue or cotton balls. Peri-Wound Care Sween Lotion (Moisturizing lotion) Discharge Instruction: Apply moisturizing lotion as directed Topical Primary Dressing Hydrofera Blue Ready Transfer Foam, 4x5 (in/in) Discharge Instruction: Apply to wound bed as instructed Secondary Dressing ABD Pad, 8x10 Discharge Instruction: Apply over primary dressing as directed. Optifoam Non-Adhesive Dressing, 4x4 in Discharge Instruction: Apply to ankle for protection. Secured With Compression Wrap Urgo K2, (equivalent to a 4 layer) two layer compression system, regular Discharge Instruction: Apply Urgo K2 as directed (alternative to 4 layer compression). Compression Stockings Add-Ons Electronic  Signature(s) Signed: 03/10/2023 5:19:03 PM By: Shawn Stall RN, BSN Entered By: Shawn Stall on 03/10/2023 15:00:06 -------------------------------------------------------------------------------- Vitals Details Patient Name: Date of Service: [name] Asper NDY J. 03/10/2023 1:45 PM Medical Record Number: [id number] Patient  Account Number: [id number] Date of Birth/Sex: Treating RN: 10/17/1955 (67 y.o. [name], [name] Primary Care Sameer Teeple: Hillard Danker Other Clinician: Haywood Pao Referring Farida Mcreynolds: Treating Ata Pecha/Extender: Shara Blazing in Treatment: 1 Vital Signs Time Taken: 14:23 Temperature (F): 97.8 Height (in): 73 Pulse (bpm): 62 Source: Stated Respiratory Rate (breaths/min): 18 Weight (lbs): 254 Blood Pressure (mmHg): 118/78 Source: Stated Capillary Blood Glucose (mg/dl): 951 Body Mass Index (BMI): 33.5 Reference Range: 80 - 120 mg / dl Electronic Signature(s) Signed: 03/10/2023 6:37:51 PM By: Haywood Pao CHT EMT BS , , Entered By: Haywood Pao on 03/10/2023 14:56:21  Pain Level: 0 Tolerable Pain Level: 8 Pain Management and Medication Current Pain Management: Electronic Signature(s) Signed: 03/10/2023 5:19:03 PM By: Shawn Stall RN, BSN Signed: 03/10/2023 6:37:51 PM By: Haywood Pao CHT EMT BS , , Entered By: Haywood Pao on 03/10/2023 14:56:30 -------------------------------------------------------------------------------- Patient/Caregiver Education Details Patient Name: Date of Service: [name] 9/24/2024andnbsp1:45 PM Medical Record  Number: [id number] Patient Account Number: [id number] Date of Birth/Gender: Treating RN: 1955-11-13 (67 y.o. [name] Primary Care Physician: Hillard Danker Other Clinician: Referring Physician: Treating Physician/Extender: Shara Blazing in Treatment: 1 Education Assessment Education Provided To: Patient Education Topics Provided Wound/Skin Impairment: Handouts: Caring for Your Ulcer Methods: Explain/Verbal Responses: Reinforcements needed Electronic Signature(s) Signed: 03/10/2023 5:19:03 PM By: Shawn Stall RN, BSN Entered By: Shawn Stall on 03/10/2023 14:48:58 -------------------------------------------------------------------------------- Wound Assessment Details Patient Name: Date of Service: [name] Alpers, [name] 03/10/2023 1:45 PM [name] ([id number]) [id number] Page 6 of 7 Medical Record Number: [id number] Patient Account Number: [id number] Date of Birth/Sex: Treating RN: [birth year] (67 y.o. M) Primary Care Jacqueli Pangallo: Hillard Danker Other Clinician: Referring Dejanee Thibeaux: Treating Briannah Lona/Extender: Shara Blazing in Treatment: 1 Wound Status Wound Number: 13 Primary Venous Leg Ulcer Etiology: Wound Location: Left, Posterior Lower Leg Secondary Diabetic Wound/Ulcer of the Lower Extremity Wounding Event: Blister Etiology: Date Acquired: 01/20/2023 Wound Open Weeks Of Treatment: 1 Status: Clustered Wound: No Comorbid Asthma, Sleep Apnea, Arrhythmia, Congestive Heart Failure, History: Hypertension, Type II Diabetes Photos Wound Measurements Length: (cm) 4.6 Width: (cm) 3.5 Depth: (cm) 0.2 Area: (cm) 12.645 Volume: (cm) 2.529 % Reduction in Area: 35% % Reduction in Volume: 35% Epithelialization: None Wound Description Classification: Full Thickness Without Exposed Suppor Wound Margin: Distinct, outline attached Exudate Amount: Medium Exudate Type:  Serosanguineous Exudate Color: red, brown t Structures Foul Odor After Cleansing: No Slough/Fibrino Yes Wound Bed Granulation Amount: Large (67-100%) Exposed Structure Granulation Quality: Red, Pink, Friable Fascia Exposed: No Necrotic Amount: Small (1-33%) Fat Layer (Subcutaneous Tissue) Exposed: Yes Necrotic Quality: Adherent Slough Tendon Exposed: No Muscle Exposed: No Joint Exposed: No Bone Exposed: No Periwound Skin Texture Texture Color No Abnormalities Noted: No No Abnormalities Noted: No Callus: No Atrophie Blanche: No Crepitus: No Cyanosis: No Excoriation: No Ecchymosis: No Induration: No Erythema: No Rash: No Hemosiderin Staining: Yes Scarring: No Mottled: No Pallor: No Moisture Rubor: No No Abnormalities Noted: No Dry / Scaly: No Maceration: No Treatment Notes Wound #13 (Lower Leg) Wound Laterality: Left, Posterior Cleanser Soap and Water Discharge Instruction: May shower and wash wound with dial antibacterial soap and water prior to dressing change. [name], [name] ([id number]) [id number] Page 7 of 7 Vashe 5.8 (oz) Discharge Instruction: Cleanse the wound with Vashe prior to applying a clean dressing using gauze sponges, not tissue or cotton balls. Peri-Wound Care Sween Lotion (Moisturizing lotion) Discharge Instruction: Apply moisturizing lotion as directed Topical Primary Dressing Hydrofera Blue Ready Transfer Foam, 4x5 (in/in) Discharge Instruction: Apply to wound bed as instructed Secondary Dressing ABD Pad, 8x10 Discharge Instruction: Apply over primary dressing as directed. Optifoam Non-Adhesive Dressing, 4x4 in Discharge Instruction: Apply to ankle for protection. Secured With Compression Wrap Urgo K2, (equivalent to a 4 layer) two layer compression system, regular Discharge Instruction: Apply Urgo K2 as directed (alternative to 4 layer compression). Compression Stockings Add-Ons Electronic  Signature(s) Signed: 03/10/2023 5:19:03 PM By: Shawn Stall RN, BSN Entered By: Shawn Stall on 03/10/2023 15:00:06 -------------------------------------------------------------------------------- Vitals Details Patient Name: Date of Service: [name] Asper NDY J. 03/10/2023 1:45 PM Medical Record Number: [id number] Patient

## 2023-03-17 ENCOUNTER — Encounter (HOSPITAL_BASED_OUTPATIENT_CLINIC_OR_DEPARTMENT_OTHER): Payer: Medicare HMO | Attending: Internal Medicine | Admitting: Internal Medicine

## 2023-03-17 DIAGNOSIS — E875 Hyperkalemia: Secondary | ICD-10-CM | POA: Diagnosis not present

## 2023-03-17 DIAGNOSIS — I872 Venous insufficiency (chronic) (peripheral): Secondary | ICD-10-CM | POA: Insufficient documentation

## 2023-03-17 DIAGNOSIS — E11622 Type 2 diabetes mellitus with other skin ulcer: Secondary | ICD-10-CM | POA: Diagnosis not present

## 2023-03-17 DIAGNOSIS — I87332 Chronic venous hypertension (idiopathic) with ulcer and inflammation of left lower extremity: Secondary | ICD-10-CM | POA: Diagnosis not present

## 2023-03-17 DIAGNOSIS — L97328 Non-pressure chronic ulcer of left ankle with other specified severity: Secondary | ICD-10-CM | POA: Insufficient documentation

## 2023-03-17 DIAGNOSIS — L97828 Non-pressure chronic ulcer of other part of left lower leg with other specified severity: Secondary | ICD-10-CM | POA: Insufficient documentation

## 2023-03-17 DIAGNOSIS — I480 Paroxysmal atrial fibrillation: Secondary | ICD-10-CM | POA: Insufficient documentation

## 2023-03-17 DIAGNOSIS — I110 Hypertensive heart disease with heart failure: Secondary | ICD-10-CM | POA: Diagnosis not present

## 2023-03-17 DIAGNOSIS — I428 Other cardiomyopathies: Secondary | ICD-10-CM | POA: Insufficient documentation

## 2023-03-17 DIAGNOSIS — I5040 Unspecified combined systolic (congestive) and diastolic (congestive) heart failure: Secondary | ICD-10-CM | POA: Insufficient documentation

## 2023-03-17 DIAGNOSIS — L97228 Non-pressure chronic ulcer of left calf with other specified severity: Secondary | ICD-10-CM | POA: Diagnosis not present

## 2023-03-17 NOTE — Progress Notes (Signed)
1 x Per Week/15 Days Discharge Instructions: Apply moisturizing lotion as directed Prim Dressing: Hydrofera Blue Ready Transfer Foam, 4x5 (in/in) 1 x Per Week/15 Days ary Discharge Instructions: Apply to wound bed as instructed Secondary Dressing: ABD Pad, 8x10 1 x Per Week/15 Days Discharge Instructions: Apply over primary dressing as directed. Secondary Dressing: Optifoam Non-Adhesive Dressing, 4x4 in 1 x Per Week/15 Days Discharge Instructions: Apply to ankle for protection. Compression Wrap: Urgo K2, (equivalent to a 4 layer) two layer compression system, regular 1 x Per Week/15 Days Discharge Instructions: Apply Urgo K2 as directed (alternative to 4 layer compression). Electronic Signature(s) Signed: 03/17/2023 4:26:34 PM By: Baltazar Najjar MD Signed: 03/17/2023 4:55:59 PM By: Brenton Grills Entered By: Brenton Grills on 03/17/2023 12:07:51 -------------------------------------------------------------------------------- Problem List Details Patient Name: Date of Service: [name], [name] 03/17/2023 2:30 PM Medical Record Number: [id number] Patient Account Number: [id number] Date of Birth/Sex: Treating RN: 02-07-1956 (67 y.o. M) Primary Care Provider: Hillard Danker Other Clinician: Referring Provider: Treating Provider/Extender: Shara Blazing in Treatment: 2 Active Problems ICD-10 Encounter Code Description Active Date MDM Diagnosis L97.828 Non-pressure chronic ulcer of other part of left lower leg with other specified 03/03/2023 No Yes severity I87.332 Chronic venous hypertension (idiopathic) with ulcer and inflammation of left 03/03/2023 No Yes lower extremity E11.622 Type 2 diabetes mellitus with other skin ulcer 03/03/2023 No Yes Inactive Problems Resolved Problems JAYMIR, STRUBLE ([id number]) [id number].pdf Page 5 of 8 Electronic  Signature(s) Signed: 03/17/2023 4:26:34 PM By: Baltazar Najjar MD Entered By: Baltazar Najjar on 03/17/2023 12:24:52 -------------------------------------------------------------------------------- Progress Note Details Patient Name: Date of Service: [name] 03/17/2023 2:30 PM Medical Record Number: [id number] Patient Account Number: [id number] Date of Birth/Sex: Treating RN: 1955-09-08 (67 y.o. M) Primary Care Provider: Hillard Danker Other Clinician: Referring Provider: Treating Provider/Extender: Shara Blazing in Treatment: 2 Subjective History of Present Illness (HPI) ADMISSION 07/29/2018 Mr. Recupero is a 67 year old man with either prediabetes or diabetes he is on glipizide. In late October to November 2019 he noted a scabbed area on the back of his right calf. He picked this off a few times but it would not heal. He saw his primary physician on 12/5. It was felt at that time that this may actually heal on its own with conservative management. The next visit was on 07/20/2018 noted the area was a lot worse and arranged for his treatment here. He has been topical antibiotics like Neosporin although he stopped using this when the wound looked worse. He is just been covering this with a clean Band-Aid. He is given Bactrim a week ago and he is finishing this currently. It is made some improvement in the surrounding erythema per the patient. He does not have a history of nonhealing wounds. No prior history of wounds on his legs that he had difficulty healing. No prior skin issues. 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 Past medical history; A. fib status post ablation and recent cardioversion he is on Xarelto. He has a history of systolic heart failure, cervical radiculopathy, cardiomyopathy and type 2 diabetes as discussed ABI in the right leg was noncompressible on the right 2/20; the biopsy I did on the patient last week was  negative for malignancy. Culture grew Pseudomonas and methicillin sensitive staph aureus. He is on cefdinir 300 twice a day. I will have to make this a 10 day course. He put him in compression. He states that the redness pain and erythema are  1 x Per Week/15 Days Discharge Instructions: Apply moisturizing lotion as directed Prim Dressing: Hydrofera Blue Ready Transfer Foam, 4x5 (in/in) 1 x Per Week/15 Days ary Discharge Instructions: Apply to wound bed as instructed Secondary Dressing: ABD Pad, 8x10 1 x Per Week/15 Days Discharge Instructions: Apply over primary dressing as directed. Secondary Dressing: Optifoam Non-Adhesive Dressing, 4x4 in 1 x Per Week/15 Days Discharge Instructions: Apply to ankle for protection. Compression Wrap: Urgo K2, (equivalent to a 4 layer) two layer compression system, regular 1 x Per Week/15 Days Discharge Instructions: Apply Urgo K2 as directed (alternative to 4 layer compression). Electronic Signature(s) Signed: 03/17/2023 4:26:34 PM By: Baltazar Najjar MD Signed: 03/17/2023 4:55:59 PM By: Brenton Grills Entered By: Brenton Grills on 03/17/2023 12:07:51 -------------------------------------------------------------------------------- Problem List Details Patient Name: Date of Service: [name], [name] 03/17/2023 2:30 PM Medical Record Number: [id number] Patient Account Number: [id number] Date of Birth/Sex: Treating RN: 02-07-1956 (67 y.o. M) Primary Care Provider: Hillard Danker Other Clinician: Referring Provider: Treating Provider/Extender: Shara Blazing in Treatment: 2 Active Problems ICD-10 Encounter Code Description Active Date MDM Diagnosis L97.828 Non-pressure chronic ulcer of other part of left lower leg with other specified 03/03/2023 No Yes severity I87.332 Chronic venous hypertension (idiopathic) with ulcer and inflammation of left 03/03/2023 No Yes lower extremity E11.622 Type 2 diabetes mellitus with other skin ulcer 03/03/2023 No Yes Inactive Problems Resolved Problems JAYMIR, STRUBLE ([id number]) [id number].pdf Page 5 of 8 Electronic  Signature(s) Signed: 03/17/2023 4:26:34 PM By: Baltazar Najjar MD Entered By: Baltazar Najjar on 03/17/2023 12:24:52 -------------------------------------------------------------------------------- Progress Note Details Patient Name: Date of Service: [name] 03/17/2023 2:30 PM Medical Record Number: [id number] Patient Account Number: [id number] Date of Birth/Sex: Treating RN: 1955-09-08 (67 y.o. M) Primary Care Provider: Hillard Danker Other Clinician: Referring Provider: Treating Provider/Extender: Shara Blazing in Treatment: 2 Subjective History of Present Illness (HPI) ADMISSION 07/29/2018 Mr. Recupero is a 67 year old man with either prediabetes or diabetes he is on glipizide. In late October to November 2019 he noted a scabbed area on the back of his right calf. He picked this off a few times but it would not heal. He saw his primary physician on 12/5. It was felt at that time that this may actually heal on its own with conservative management. The next visit was on 07/20/2018 noted the area was a lot worse and arranged for his treatment here. He has been topical antibiotics like Neosporin although he stopped using this when the wound looked worse. He is just been covering this with a clean Band-Aid. He is given Bactrim a week ago and he is finishing this currently. It is made some improvement in the surrounding erythema per the patient. He does not have a history of nonhealing wounds. No prior history of wounds on his legs that he had difficulty healing. No prior skin issues. 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 Past medical history; A. fib status post ablation and recent cardioversion he is on Xarelto. He has a history of systolic heart failure, cervical radiculopathy, cardiomyopathy and type 2 diabetes as discussed ABI in the right leg was noncompressible on the right 2/20; the biopsy I did on the patient last week was  negative for malignancy. Culture grew Pseudomonas and methicillin sensitive staph aureus. He is on cefdinir 300 twice a day. I will have to make this a 10 day course. He put him in compression. He states that the redness pain and erythema are  1 x Per Week/15 Days Discharge Instructions: Apply moisturizing lotion as directed Prim Dressing: Hydrofera Blue Ready Transfer Foam, 4x5 (in/in) 1 x Per Week/15 Days ary Discharge Instructions: Apply to wound bed as instructed Secondary Dressing: ABD Pad, 8x10 1 x Per Week/15 Days Discharge Instructions: Apply over primary dressing as directed. Secondary Dressing: Optifoam Non-Adhesive Dressing, 4x4 in 1 x Per Week/15 Days Discharge Instructions: Apply to ankle for protection. Compression Wrap: Urgo K2, (equivalent to a 4 layer) two layer compression system, regular 1 x Per Week/15 Days Discharge Instructions: Apply Urgo K2 as directed (alternative to 4 layer compression). Electronic Signature(s) Signed: 03/17/2023 4:26:34 PM By: Baltazar Najjar MD Signed: 03/17/2023 4:55:59 PM By: Brenton Grills Entered By: Brenton Grills on 03/17/2023 12:07:51 -------------------------------------------------------------------------------- Problem List Details Patient Name: Date of Service: [name], [name] 03/17/2023 2:30 PM Medical Record Number: [id number] Patient Account Number: [id number] Date of Birth/Sex: Treating RN: 02-07-1956 (67 y.o. M) Primary Care Provider: Hillard Danker Other Clinician: Referring Provider: Treating Provider/Extender: Shara Blazing in Treatment: 2 Active Problems ICD-10 Encounter Code Description Active Date MDM Diagnosis L97.828 Non-pressure chronic ulcer of other part of left lower leg with other specified 03/03/2023 No Yes severity I87.332 Chronic venous hypertension (idiopathic) with ulcer and inflammation of left 03/03/2023 No Yes lower extremity E11.622 Type 2 diabetes mellitus with other skin ulcer 03/03/2023 No Yes Inactive Problems Resolved Problems JAYMIR, STRUBLE ([id number]) [id number].pdf Page 5 of 8 Electronic  Signature(s) Signed: 03/17/2023 4:26:34 PM By: Baltazar Najjar MD Entered By: Baltazar Najjar on 03/17/2023 12:24:52 -------------------------------------------------------------------------------- Progress Note Details Patient Name: Date of Service: [name] 03/17/2023 2:30 PM Medical Record Number: [id number] Patient Account Number: [id number] Date of Birth/Sex: Treating RN: 1955-09-08 (67 y.o. M) Primary Care Provider: Hillard Danker Other Clinician: Referring Provider: Treating Provider/Extender: Shara Blazing in Treatment: 2 Subjective History of Present Illness (HPI) ADMISSION 07/29/2018 Mr. Recupero is a 67 year old man with either prediabetes or diabetes he is on glipizide. In late October to November 2019 he noted a scabbed area on the back of his right calf. He picked this off a few times but it would not heal. He saw his primary physician on 12/5. It was felt at that time that this may actually heal on its own with conservative management. The next visit was on 07/20/2018 noted the area was a lot worse and arranged for his treatment here. He has been topical antibiotics like Neosporin although he stopped using this when the wound looked worse. He is just been covering this with a clean Band-Aid. He is given Bactrim a week ago and he is finishing this currently. It is made some improvement in the surrounding erythema per the patient. He does not have a history of nonhealing wounds. No prior history of wounds on his legs that he had difficulty healing. No prior skin issues. 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 Past medical history; A. fib status post ablation and recent cardioversion he is on Xarelto. He has a history of systolic heart failure, cervical radiculopathy, cardiomyopathy and type 2 diabetes as discussed ABI in the right leg was noncompressible on the right 2/20; the biopsy I did on the patient last week was  negative for malignancy. Culture grew Pseudomonas and methicillin sensitive staph aureus. He is on cefdinir 300 twice a day. I will have to make this a 10 day course. He put him in compression. He states that the redness pain and erythema are  1 x Per Week/15 Days Discharge Instructions: Apply moisturizing lotion as directed Prim Dressing: Hydrofera Blue Ready Transfer Foam, 4x5 (in/in) 1 x Per Week/15 Days ary Discharge Instructions: Apply to wound bed as instructed Secondary Dressing: ABD Pad, 8x10 1 x Per Week/15 Days Discharge Instructions: Apply over primary dressing as directed. Secondary Dressing: Optifoam Non-Adhesive Dressing, 4x4 in 1 x Per Week/15 Days Discharge Instructions: Apply to ankle for protection. Compression Wrap: Urgo K2, (equivalent to a 4 layer) two layer compression system, regular 1 x Per Week/15 Days Discharge Instructions: Apply Urgo K2 as directed (alternative to 4 layer compression). Electronic Signature(s) Signed: 03/17/2023 4:26:34 PM By: Baltazar Najjar MD Signed: 03/17/2023 4:55:59 PM By: Brenton Grills Entered By: Brenton Grills on 03/17/2023 12:07:51 -------------------------------------------------------------------------------- Problem List Details Patient Name: Date of Service: [name], [name] 03/17/2023 2:30 PM Medical Record Number: [id number] Patient Account Number: [id number] Date of Birth/Sex: Treating RN: 02-07-1956 (67 y.o. M) Primary Care Provider: Hillard Danker Other Clinician: Referring Provider: Treating Provider/Extender: Shara Blazing in Treatment: 2 Active Problems ICD-10 Encounter Code Description Active Date MDM Diagnosis L97.828 Non-pressure chronic ulcer of other part of left lower leg with other specified 03/03/2023 No Yes severity I87.332 Chronic venous hypertension (idiopathic) with ulcer and inflammation of left 03/03/2023 No Yes lower extremity E11.622 Type 2 diabetes mellitus with other skin ulcer 03/03/2023 No Yes Inactive Problems Resolved Problems JAYMIR, STRUBLE ([id number]) [id number].pdf Page 5 of 8 Electronic  Signature(s) Signed: 03/17/2023 4:26:34 PM By: Baltazar Najjar MD Entered By: Baltazar Najjar on 03/17/2023 12:24:52 -------------------------------------------------------------------------------- Progress Note Details Patient Name: Date of Service: [name] 03/17/2023 2:30 PM Medical Record Number: [id number] Patient Account Number: [id number] Date of Birth/Sex: Treating RN: 1955-09-08 (67 y.o. M) Primary Care Provider: Hillard Danker Other Clinician: Referring Provider: Treating Provider/Extender: Shara Blazing in Treatment: 2 Subjective History of Present Illness (HPI) ADMISSION 07/29/2018 Mr. Recupero is a 67 year old man with either prediabetes or diabetes he is on glipizide. In late October to November 2019 he noted a scabbed area on the back of his right calf. He picked this off a few times but it would not heal. He saw his primary physician on 12/5. It was felt at that time that this may actually heal on its own with conservative management. The next visit was on 07/20/2018 noted the area was a lot worse and arranged for his treatment here. He has been topical antibiotics like Neosporin although he stopped using this when the wound looked worse. He is just been covering this with a clean Band-Aid. He is given Bactrim a week ago and he is finishing this currently. It is made some improvement in the surrounding erythema per the patient. He does not have a history of nonhealing wounds. No prior history of wounds on his legs that he had difficulty healing. No prior skin issues. 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 Past medical history; A. fib status post ablation and recent cardioversion he is on Xarelto. He has a history of systolic heart failure, cervical radiculopathy, cardiomyopathy and type 2 diabetes as discussed ABI in the right leg was noncompressible on the right 2/20; the biopsy I did on the patient last week was  negative for malignancy. Culture grew Pseudomonas and methicillin sensitive staph aureus. He is on cefdinir 300 twice a day. I will have to make this a 10 day course. He put him in compression. He states that the redness pain and erythema are  1 x Per Week/15 Days Discharge Instructions: Apply moisturizing lotion as directed Prim Dressing: Hydrofera Blue Ready Transfer Foam, 4x5 (in/in) 1 x Per Week/15 Days ary Discharge Instructions: Apply to wound bed as instructed Secondary Dressing: ABD Pad, 8x10 1 x Per Week/15 Days Discharge Instructions: Apply over primary dressing as directed. Secondary Dressing: Optifoam Non-Adhesive Dressing, 4x4 in 1 x Per Week/15 Days Discharge Instructions: Apply to ankle for protection. Compression Wrap: Urgo K2, (equivalent to a 4 layer) two layer compression system, regular 1 x Per Week/15 Days Discharge Instructions: Apply Urgo K2 as directed (alternative to 4 layer compression). Electronic Signature(s) Signed: 03/17/2023 4:26:34 PM By: Baltazar Najjar MD Signed: 03/17/2023 4:55:59 PM By: Brenton Grills Entered By: Brenton Grills on 03/17/2023 12:07:51 -------------------------------------------------------------------------------- Problem List Details Patient Name: Date of Service: [name], [name] 03/17/2023 2:30 PM Medical Record Number: [id number] Patient Account Number: [id number] Date of Birth/Sex: Treating RN: 02-07-1956 (67 y.o. M) Primary Care Provider: Hillard Danker Other Clinician: Referring Provider: Treating Provider/Extender: Shara Blazing in Treatment: 2 Active Problems ICD-10 Encounter Code Description Active Date MDM Diagnosis L97.828 Non-pressure chronic ulcer of other part of left lower leg with other specified 03/03/2023 No Yes severity I87.332 Chronic venous hypertension (idiopathic) with ulcer and inflammation of left 03/03/2023 No Yes lower extremity E11.622 Type 2 diabetes mellitus with other skin ulcer 03/03/2023 No Yes Inactive Problems Resolved Problems JAYMIR, STRUBLE ([id number]) [id number].pdf Page 5 of 8 Electronic  Signature(s) Signed: 03/17/2023 4:26:34 PM By: Baltazar Najjar MD Entered By: Baltazar Najjar on 03/17/2023 12:24:52 -------------------------------------------------------------------------------- Progress Note Details Patient Name: Date of Service: [name] 03/17/2023 2:30 PM Medical Record Number: [id number] Patient Account Number: [id number] Date of Birth/Sex: Treating RN: 1955-09-08 (67 y.o. M) Primary Care Provider: Hillard Danker Other Clinician: Referring Provider: Treating Provider/Extender: Shara Blazing in Treatment: 2 Subjective History of Present Illness (HPI) ADMISSION 07/29/2018 Mr. Recupero is a 67 year old man with either prediabetes or diabetes he is on glipizide. In late October to November 2019 he noted a scabbed area on the back of his right calf. He picked this off a few times but it would not heal. He saw his primary physician on 12/5. It was felt at that time that this may actually heal on its own with conservative management. The next visit was on 07/20/2018 noted the area was a lot worse and arranged for his treatment here. He has been topical antibiotics like Neosporin although he stopped using this when the wound looked worse. He is just been covering this with a clean Band-Aid. He is given Bactrim a week ago and he is finishing this currently. It is made some improvement in the surrounding erythema per the patient. He does not have a history of nonhealing wounds. No prior history of wounds on his legs that he had difficulty healing. No prior skin issues. 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 Past medical history; A. fib status post ablation and recent cardioversion he is on Xarelto. He has a history of systolic heart failure, cervical radiculopathy, cardiomyopathy and type 2 diabetes as discussed ABI in the right leg was noncompressible on the right 2/20; the biopsy I did on the patient last week was  negative for malignancy. Culture grew Pseudomonas and methicillin sensitive staph aureus. He is on cefdinir 300 twice a day. I will have to make this a 10 day course. He put him in compression. He states that the redness pain and erythema are  DILLIAN, FEIG ([id number]) [id number] Page 1 of 8 Visit Report for 03/17/2023 HPI Details Patient Name: Date of Service: HAYNES, GIANNOTTI 03/17/2023 2:30 PM Medical Record Number: [id number] Patient Account Number: [id number] Date of Birth/Sex: Treating RN: January 27, 1956 (67 y.o. M) Primary Care Provider: Hillard Danker Other Clinician: Referring Provider: Treating Provider/Extender: Shara Blazing in Treatment: 2 History of Present Illness HPI Description: ADMISSION 07/29/2018 Mr. Narramore is a 67 year old man with either prediabetes or diabetes he is on glipizide. In late October to November 2019 he noted a scabbed area on the back of his right calf. He picked this off a few times but it would not heal. He saw his primary physician on 12/5. It was felt at that time that this may actually heal on its own with conservative management. The next visit was on 07/20/2018 noted the area was a lot worse and arranged for his treatment here. He has been topical antibiotics like Neosporin although he stopped using this when the wound looked worse. He is just been covering this with a clean Band-Aid. He is given Bactrim a week ago and he is finishing this currently. It is made some improvement in the surrounding erythema per the patient. He does not have a history of nonhealing wounds. No prior history of wounds on his legs that he had difficulty healing. No prior skin issues. 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 Past medical history; A. fib status post ablation and recent cardioversion he is on Xarelto. He has a history of systolic heart failure, cervical radiculopathy, cardiomyopathy and type 2 diabetes as discussed ABI in the right leg was noncompressible on the right 2/20; the biopsy I did on the patient last week was negative for malignancy. Culture grew Pseudomonas and methicillin sensitive staph aureus. He is on cefdinir  300 twice a day. I will have to make this a 10 day course. He put him in compression. He states that the redness pain and erythema are a lot better. I suspect the patient has chronic venous insufficiency probably with a secondary cellulitis 2/27; arrives today with copious amounts of drainage coming out of the wound irritating the skin below the wound area. He only renewed the final 3 days of cefdinir today 3/5; came in for a nurse change 3 days ago. Again a lot of drainage noted. Zinc oxide was applied unfortunately the drainage appears to a pool then he has a string of superficial open areas extending down into the Achilles area and some just below the wound. The wound itself does not look too bad. He is completed the antibiotics although apparently there was separation from the original 7 with the last 3 days. Culture grew a few MSSA and a few Pseudomonas 3/12; not too much difference over the last week. He came in for a dressing change on Monday by our nursing staff. Necrotic debris again over the wound surface. The entire area looks irritated but nontender and I do not think shows obvious evidence of infection. We have not heard anything about the reflux studies. Also notable than in this diabetic man we had noncompressible vessels and although I can feel his pulses easily in his feet I will order arterial studies as well. 3/19-Patient had experience more pain has been dressed with a silver alginate, the wounds all have necrotic debris, very friable with easy bleeding with any kind of surface debridement including with gauze and Anasept and with a #3 curette. His vascular  leg was involved in that study or not we will need to review that. His past medical history includes type type 2 diabetes, chronic venous insufficiency with secondary lymphedema, intermittent A-fib, nonischemic cardiomyopathy, thrombus of the left  atrial appendage, hereditary hemochromatosis cytosis. He is now on Coumadin presumably for the atrial fibrillation. MARKCUS, LAZENBY ([id number]) [id number] Page 3 of 8 ABIs in this clinic were not repeated 9/24; posterior left calf wound. We used Hydrofera Blue under compression. The wound looks better this week better looking surface. 10/1. Posterior left calf wound secondary to venous insufficiency. He was new to the clinic in 2 weeks ago at which time I thought he was going to require a difficult set of mechanical debridements however the wound surface is continuing to improve using Hydrofera Blue although the dimensions not so remarkably improved today Electronic Signature(s) Signed: 03/17/2023 4:26:34 PM By: Baltazar Najjar MD Entered By: Baltazar Najjar on 03/17/2023 12:25:58 -------------------------------------------------------------------------------- Physical Exam Details Patient Name: Date of Service: [name] 03/17/2023 2:30 PM Medical Record Number: [id number] Patient Account Number: [id number] Date of Birth/Sex: Treating RN: 1956-01-21 (67 y.o. M) Primary Care Provider: Hillard Danker Other Clinician: Referring Provider: Treating Provider/Extender: Shara Blazing in Treatment: 2 Constitutional Sitting or standing Blood Pressure is within target range for patient.. Pulse regular and within target range for patient.[name] Respirations regular, non-labored and within target range.. Temperature is normal and within the target range for the patient.[name] Appears in no distress. Notes Wound exam; posterior left calf much improved granulation surface. No mechanical debridement is required the granulation looks healthy. No evidence of surrounding infection his edema control looks good Electronic Signature(s) Signed: 03/17/2023 4:26:34 PM By: Baltazar Najjar MD Entered By: Baltazar Najjar on 03/17/2023  12:26:56 -------------------------------------------------------------------------------- Physician Orders Details Patient Name: Date of Service: [name] 03/17/2023 2:30 PM Medical Record Number: [id number] Patient Account Number: [id number] Date of Birth/Sex: Treating RN: 05/13/1956 (67 y.o. Yates Decamp Primary Care Provider: Hillard Danker Other Clinician: Referring Provider: Treating Provider/Extender: Shara Blazing in Treatment: 2 Verbal / Phone Orders: No Diagnosis Coding Follow-up Appointments ppointment in 1 week. - Dr Leanord Hawking Tuesday (front office to book appt) Return A ppointment in 2 weeks. - Dr Leanord Hawking Tuesday (front office to book appt) Return A Return appointment in 3 weeks. - Dr Leanord Hawking Tuesday (front office to book appt) Return appointment in 1 month. - Dr Leanord Hawking Tuesday (front office to book appt) Anesthetic (In clinic) Topical Lidocaine 4% applied to wound bed Bathing/ Shower/ Hygiene May shower with protection but do not get wound dressing(s) wet. Protect dressing(s) with water repellant cover (for example, large plastic bag) or a cast cover and may then take shower. Edema Control - Lymphedema / SCD / Other Left Lower Extremity DANN, GALICIA ([id number]) [id number] Page 4 of 8 Elevate legs to the level of the heart or above for 30 minutes daily and/or when sitting for 3-4 times a day throughout the day. Avoid standing for long periods of time. Exercise regularly - walking is good for circulation Wound Treatment Wound #13 - Lower Leg Wound Laterality: Left, Posterior Cleanser: Soap and Water 1 x Per Week/15 Days Discharge Instructions: May shower and wash wound with dial antibacterial soap and water prior to dressing change. Cleanser: Vashe 5.8 (oz) 1 x Per Week/15 Days Discharge Instructions: Cleanse the wound with Vashe prior to applying a clean dressing using gauze sponges, not tissue  or cotton balls. Peri-Wound Care: Sween Lotion (Moisturizing lotion)  within the wound bed. There is a small (1- 33%) amount of necrotic tissue within the wound bed including Adherent Slough. The periwound skin appearance exhibited: Hemosiderin Staining. The periwound skin appearance did not exhibit: Callus, Crepitus, Excoriation, Induration, Rash, Scarring, Dry/Scaly, Maceration, Atrophie Blanche, Cyanosis, Ecchymosis, Mottled, Pallor, Rubor, Erythema. Assessment Active Problems ICD-10 Non-pressure chronic ulcer of other part of left lower leg with other specified severity Chronic venous hypertension (idiopathic) with ulcer and inflammation of left lower extremity Type 2 diabetes mellitus with other skin ulcer Procedures Wound #13 Pre-procedure diagnosis of Wound #13 is a Venous Leg Ulcer located on the Left,Posterior Lower Leg . There was a Four Layer Compression Therapy Procedure by Brenton Grills, RN. Post procedure Diagnosis Wound #13: Same as Pre-Procedure Plan Follow-up Appointments: Return Appointment in 1 week. - Dr Leanord Hawking Tuesday (front office to book appt) Return Appointment in 2 weeks. - Dr Leanord Hawking Tuesday (front office to book appt) Return appointment in 3 weeks. - Dr Leanord Hawking Tuesday (front office to book appt) Return appointment in 1 month. - Dr Leanord Hawking Tuesday (front office to book appt) Anesthetic: (In clinic) Topical Lidocaine 4% applied to wound bed Bathing/ Shower/ Hygiene: May shower with protection but do not get wound dressing(s) wet. Protect dressing(s) with water repellant cover (for example, large plastic bag) or a cast cover and may then take shower. Edema Control - Lymphedema / SCD / Other: Elevate legs to the level of the heart or above for 30 minutes daily and/or when sitting for 3-4 times a day throughout the day. Avoid standing for  long periods of time. Exercise regularly - walking is good for circulation REYNARD, CHRISTOFFERSEN ([id number]) [id number] Page 8 of 8 WOUND #13: - Lower Leg Wound Laterality: Left, Posterior Cleanser: Soap and Water 1 x Per Week/15 Days Discharge Instructions: May shower and wash wound with dial antibacterial soap and water prior to dressing change. Cleanser: Vashe 5.8 (oz) 1 x Per Week/15 Days Discharge Instructions: Cleanse the wound with Vashe prior to applying a clean dressing using gauze sponges, not tissue or cotton balls. Peri-Wound Care: Sween Lotion (Moisturizing lotion) 1 x Per Week/15 Days Discharge Instructions: Apply moisturizing lotion as directed Prim Dressing: Hydrofera Blue Ready Transfer Foam, 4x5 (in/in) 1 x Per Week/15 Days ary Discharge Instructions: Apply to wound bed as instructed Secondary Dressing: ABD Pad, 8x10 1 x Per Week/15 Days Discharge Instructions: Apply over primary dressing as directed. Secondary Dressing: Optifoam Non-Adhesive Dressing, 4x4 in 1 x Per Week/15 Days Discharge Instructions: Apply to ankle for protection. Com pression Wrap: Urgo K2, (equivalent to a 4 layer) two layer compression system, regular 1 x Per Week/15 Days Discharge Instructions: Apply Urgo K2 as directed (alternative to 4 layer compression). 1. Because of the improvement in wound surface I have continued the Hydrofera Blue 2. Careful attention to dimensions next week. He has no depth but I would like to see this began to improve in terms of overall surface area. 3. Fortunately no evidence of infection 4. The patient asked about an advanced treatment product. Because this is usually useful mostly for promoting granulation I do not think that is going to be necessary Electronic Signature(s) Signed: 03/17/2023 4:26:34 PM By: Baltazar Najjar MD Entered By: Baltazar Najjar on 03/17/2023  12:28:01 -------------------------------------------------------------------------------- SuperBill Details Patient Name: Date of Service: [name] 03/17/2023 Medical Record Number: [id number] Patient Account Number: [id number] Date of Birth/Sex: Treating RN: 1955-07-17 (67 y.o. Yates Decamp Primary Care Provider: Hillard Danker Other Clinician: Referring Provider: Treating  DILLIAN, FEIG ([id number]) [id number] Page 1 of 8 Visit Report for 03/17/2023 HPI Details Patient Name: Date of Service: HAYNES, GIANNOTTI 03/17/2023 2:30 PM Medical Record Number: [id number] Patient Account Number: [id number] Date of Birth/Sex: Treating RN: January 27, 1956 (67 y.o. M) Primary Care Provider: Hillard Danker Other Clinician: Referring Provider: Treating Provider/Extender: Shara Blazing in Treatment: 2 History of Present Illness HPI Description: ADMISSION 07/29/2018 Mr. Narramore is a 67 year old man with either prediabetes or diabetes he is on glipizide. In late October to November 2019 he noted a scabbed area on the back of his right calf. He picked this off a few times but it would not heal. He saw his primary physician on 12/5. It was felt at that time that this may actually heal on its own with conservative management. The next visit was on 07/20/2018 noted the area was a lot worse and arranged for his treatment here. He has been topical antibiotics like Neosporin although he stopped using this when the wound looked worse. He is just been covering this with a clean Band-Aid. He is given Bactrim a week ago and he is finishing this currently. It is made some improvement in the surrounding erythema per the patient. He does not have a history of nonhealing wounds. No prior history of wounds on his legs that he had difficulty healing. No prior skin issues. 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 Past medical history; A. fib status post ablation and recent cardioversion he is on Xarelto. He has a history of systolic heart failure, cervical radiculopathy, cardiomyopathy and type 2 diabetes as discussed ABI in the right leg was noncompressible on the right 2/20; the biopsy I did on the patient last week was negative for malignancy. Culture grew Pseudomonas and methicillin sensitive staph aureus. He is on cefdinir  300 twice a day. I will have to make this a 10 day course. He put him in compression. He states that the redness pain and erythema are a lot better. I suspect the patient has chronic venous insufficiency probably with a secondary cellulitis 2/27; arrives today with copious amounts of drainage coming out of the wound irritating the skin below the wound area. He only renewed the final 3 days of cefdinir today 3/5; came in for a nurse change 3 days ago. Again a lot of drainage noted. Zinc oxide was applied unfortunately the drainage appears to a pool then he has a string of superficial open areas extending down into the Achilles area and some just below the wound. The wound itself does not look too bad. He is completed the antibiotics although apparently there was separation from the original 7 with the last 3 days. Culture grew a few MSSA and a few Pseudomonas 3/12; not too much difference over the last week. He came in for a dressing change on Monday by our nursing staff. Necrotic debris again over the wound surface. The entire area looks irritated but nontender and I do not think shows obvious evidence of infection. We have not heard anything about the reflux studies. Also notable than in this diabetic man we had noncompressible vessels and although I can feel his pulses easily in his feet I will order arterial studies as well. 3/19-Patient had experience more pain has been dressed with a silver alginate, the wounds all have necrotic debris, very friable with easy bleeding with any kind of surface debridement including with gauze and Anasept and with a #3 curette. His vascular  DILLIAN, FEIG ([id number]) [id number] Page 1 of 8 Visit Report for 03/17/2023 HPI Details Patient Name: Date of Service: HAYNES, GIANNOTTI 03/17/2023 2:30 PM Medical Record Number: [id number] Patient Account Number: [id number] Date of Birth/Sex: Treating RN: January 27, 1956 (67 y.o. M) Primary Care Provider: Hillard Danker Other Clinician: Referring Provider: Treating Provider/Extender: Shara Blazing in Treatment: 2 History of Present Illness HPI Description: ADMISSION 07/29/2018 Mr. Narramore is a 67 year old man with either prediabetes or diabetes he is on glipizide. In late October to November 2019 he noted a scabbed area on the back of his right calf. He picked this off a few times but it would not heal. He saw his primary physician on 12/5. It was felt at that time that this may actually heal on its own with conservative management. The next visit was on 07/20/2018 noted the area was a lot worse and arranged for his treatment here. He has been topical antibiotics like Neosporin although he stopped using this when the wound looked worse. He is just been covering this with a clean Band-Aid. He is given Bactrim a week ago and he is finishing this currently. It is made some improvement in the surrounding erythema per the patient. He does not have a history of nonhealing wounds. No prior history of wounds on his legs that he had difficulty healing. No prior skin issues. 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 Past medical history; A. fib status post ablation and recent cardioversion he is on Xarelto. He has a history of systolic heart failure, cervical radiculopathy, cardiomyopathy and type 2 diabetes as discussed ABI in the right leg was noncompressible on the right 2/20; the biopsy I did on the patient last week was negative for malignancy. Culture grew Pseudomonas and methicillin sensitive staph aureus. He is on cefdinir  300 twice a day. I will have to make this a 10 day course. He put him in compression. He states that the redness pain and erythema are a lot better. I suspect the patient has chronic venous insufficiency probably with a secondary cellulitis 2/27; arrives today with copious amounts of drainage coming out of the wound irritating the skin below the wound area. He only renewed the final 3 days of cefdinir today 3/5; came in for a nurse change 3 days ago. Again a lot of drainage noted. Zinc oxide was applied unfortunately the drainage appears to a pool then he has a string of superficial open areas extending down into the Achilles area and some just below the wound. The wound itself does not look too bad. He is completed the antibiotics although apparently there was separation from the original 7 with the last 3 days. Culture grew a few MSSA and a few Pseudomonas 3/12; not too much difference over the last week. He came in for a dressing change on Monday by our nursing staff. Necrotic debris again over the wound surface. The entire area looks irritated but nontender and I do not think shows obvious evidence of infection. We have not heard anything about the reflux studies. Also notable than in this diabetic man we had noncompressible vessels and although I can feel his pulses easily in his feet I will order arterial studies as well. 3/19-Patient had experience more pain has been dressed with a silver alginate, the wounds all have necrotic debris, very friable with easy bleeding with any kind of surface debridement including with gauze and Anasept and with a #3 curette. His vascular  DILLIAN, FEIG ([id number]) [id number] Page 1 of 8 Visit Report for 03/17/2023 HPI Details Patient Name: Date of Service: HAYNES, GIANNOTTI 03/17/2023 2:30 PM Medical Record Number: [id number] Patient Account Number: [id number] Date of Birth/Sex: Treating RN: January 27, 1956 (67 y.o. M) Primary Care Provider: Hillard Danker Other Clinician: Referring Provider: Treating Provider/Extender: Shara Blazing in Treatment: 2 History of Present Illness HPI Description: ADMISSION 07/29/2018 Mr. Narramore is a 67 year old man with either prediabetes or diabetes he is on glipizide. In late October to November 2019 he noted a scabbed area on the back of his right calf. He picked this off a few times but it would not heal. He saw his primary physician on 12/5. It was felt at that time that this may actually heal on its own with conservative management. The next visit was on 07/20/2018 noted the area was a lot worse and arranged for his treatment here. He has been topical antibiotics like Neosporin although he stopped using this when the wound looked worse. He is just been covering this with a clean Band-Aid. He is given Bactrim a week ago and he is finishing this currently. It is made some improvement in the surrounding erythema per the patient. He does not have a history of nonhealing wounds. No prior history of wounds on his legs that he had difficulty healing. No prior skin issues. 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 Past medical history; A. fib status post ablation and recent cardioversion he is on Xarelto. He has a history of systolic heart failure, cervical radiculopathy, cardiomyopathy and type 2 diabetes as discussed ABI in the right leg was noncompressible on the right 2/20; the biopsy I did on the patient last week was negative for malignancy. Culture grew Pseudomonas and methicillin sensitive staph aureus. He is on cefdinir  300 twice a day. I will have to make this a 10 day course. He put him in compression. He states that the redness pain and erythema are a lot better. I suspect the patient has chronic venous insufficiency probably with a secondary cellulitis 2/27; arrives today with copious amounts of drainage coming out of the wound irritating the skin below the wound area. He only renewed the final 3 days of cefdinir today 3/5; came in for a nurse change 3 days ago. Again a lot of drainage noted. Zinc oxide was applied unfortunately the drainage appears to a pool then he has a string of superficial open areas extending down into the Achilles area and some just below the wound. The wound itself does not look too bad. He is completed the antibiotics although apparently there was separation from the original 7 with the last 3 days. Culture grew a few MSSA and a few Pseudomonas 3/12; not too much difference over the last week. He came in for a dressing change on Monday by our nursing staff. Necrotic debris again over the wound surface. The entire area looks irritated but nontender and I do not think shows obvious evidence of infection. We have not heard anything about the reflux studies. Also notable than in this diabetic man we had noncompressible vessels and although I can feel his pulses easily in his feet I will order arterial studies as well. 3/19-Patient had experience more pain has been dressed with a silver alginate, the wounds all have necrotic debris, very friable with easy bleeding with any kind of surface debridement including with gauze and Anasept and with a #3 curette. His vascular  DILLIAN, FEIG ([id number]) [id number] Page 1 of 8 Visit Report for 03/17/2023 HPI Details Patient Name: Date of Service: HAYNES, GIANNOTTI 03/17/2023 2:30 PM Medical Record Number: [id number] Patient Account Number: [id number] Date of Birth/Sex: Treating RN: January 27, 1956 (67 y.o. M) Primary Care Provider: Hillard Danker Other Clinician: Referring Provider: Treating Provider/Extender: Shara Blazing in Treatment: 2 History of Present Illness HPI Description: ADMISSION 07/29/2018 Mr. Narramore is a 67 year old man with either prediabetes or diabetes he is on glipizide. In late October to November 2019 he noted a scabbed area on the back of his right calf. He picked this off a few times but it would not heal. He saw his primary physician on 12/5. It was felt at that time that this may actually heal on its own with conservative management. The next visit was on 07/20/2018 noted the area was a lot worse and arranged for his treatment here. He has been topical antibiotics like Neosporin although he stopped using this when the wound looked worse. He is just been covering this with a clean Band-Aid. He is given Bactrim a week ago and he is finishing this currently. It is made some improvement in the surrounding erythema per the patient. He does not have a history of nonhealing wounds. No prior history of wounds on his legs that he had difficulty healing. No prior skin issues. 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 Past medical history; A. fib status post ablation and recent cardioversion he is on Xarelto. He has a history of systolic heart failure, cervical radiculopathy, cardiomyopathy and type 2 diabetes as discussed ABI in the right leg was noncompressible on the right 2/20; the biopsy I did on the patient last week was negative for malignancy. Culture grew Pseudomonas and methicillin sensitive staph aureus. He is on cefdinir  300 twice a day. I will have to make this a 10 day course. He put him in compression. He states that the redness pain and erythema are a lot better. I suspect the patient has chronic venous insufficiency probably with a secondary cellulitis 2/27; arrives today with copious amounts of drainage coming out of the wound irritating the skin below the wound area. He only renewed the final 3 days of cefdinir today 3/5; came in for a nurse change 3 days ago. Again a lot of drainage noted. Zinc oxide was applied unfortunately the drainage appears to a pool then he has a string of superficial open areas extending down into the Achilles area and some just below the wound. The wound itself does not look too bad. He is completed the antibiotics although apparently there was separation from the original 7 with the last 3 days. Culture grew a few MSSA and a few Pseudomonas 3/12; not too much difference over the last week. He came in for a dressing change on Monday by our nursing staff. Necrotic debris again over the wound surface. The entire area looks irritated but nontender and I do not think shows obvious evidence of infection. We have not heard anything about the reflux studies. Also notable than in this diabetic man we had noncompressible vessels and although I can feel his pulses easily in his feet I will order arterial studies as well. 3/19-Patient had experience more pain has been dressed with a silver alginate, the wounds all have necrotic debris, very friable with easy bleeding with any kind of surface debridement including with gauze and Anasept and with a #3 curette. His vascular  leg was involved in that study or not we will need to review that. His past medical history includes type type 2 diabetes, chronic venous insufficiency with secondary lymphedema, intermittent A-fib, nonischemic cardiomyopathy, thrombus of the left  atrial appendage, hereditary hemochromatosis cytosis. He is now on Coumadin presumably for the atrial fibrillation. MARKCUS, LAZENBY ([id number]) [id number] Page 3 of 8 ABIs in this clinic were not repeated 9/24; posterior left calf wound. We used Hydrofera Blue under compression. The wound looks better this week better looking surface. 10/1. Posterior left calf wound secondary to venous insufficiency. He was new to the clinic in 2 weeks ago at which time I thought he was going to require a difficult set of mechanical debridements however the wound surface is continuing to improve using Hydrofera Blue although the dimensions not so remarkably improved today Electronic Signature(s) Signed: 03/17/2023 4:26:34 PM By: Baltazar Najjar MD Entered By: Baltazar Najjar on 03/17/2023 12:25:58 -------------------------------------------------------------------------------- Physical Exam Details Patient Name: Date of Service: [name] 03/17/2023 2:30 PM Medical Record Number: [id number] Patient Account Number: [id number] Date of Birth/Sex: Treating RN: 1956-01-21 (67 y.o. M) Primary Care Provider: Hillard Danker Other Clinician: Referring Provider: Treating Provider/Extender: Shara Blazing in Treatment: 2 Constitutional Sitting or standing Blood Pressure is within target range for patient.. Pulse regular and within target range for patient.[name] Respirations regular, non-labored and within target range.. Temperature is normal and within the target range for the patient.[name] Appears in no distress. Notes Wound exam; posterior left calf much improved granulation surface. No mechanical debridement is required the granulation looks healthy. No evidence of surrounding infection his edema control looks good Electronic Signature(s) Signed: 03/17/2023 4:26:34 PM By: Baltazar Najjar MD Entered By: Baltazar Najjar on 03/17/2023  12:26:56 -------------------------------------------------------------------------------- Physician Orders Details Patient Name: Date of Service: [name] 03/17/2023 2:30 PM Medical Record Number: [id number] Patient Account Number: [id number] Date of Birth/Sex: Treating RN: 05/13/1956 (67 y.o. Yates Decamp Primary Care Provider: Hillard Danker Other Clinician: Referring Provider: Treating Provider/Extender: Shara Blazing in Treatment: 2 Verbal / Phone Orders: No Diagnosis Coding Follow-up Appointments ppointment in 1 week. - Dr Leanord Hawking Tuesday (front office to book appt) Return A ppointment in 2 weeks. - Dr Leanord Hawking Tuesday (front office to book appt) Return A Return appointment in 3 weeks. - Dr Leanord Hawking Tuesday (front office to book appt) Return appointment in 1 month. - Dr Leanord Hawking Tuesday (front office to book appt) Anesthetic (In clinic) Topical Lidocaine 4% applied to wound bed Bathing/ Shower/ Hygiene May shower with protection but do not get wound dressing(s) wet. Protect dressing(s) with water repellant cover (for example, large plastic bag) or a cast cover and may then take shower. Edema Control - Lymphedema / SCD / Other Left Lower Extremity DANN, GALICIA ([id number]) [id number] Page 4 of 8 Elevate legs to the level of the heart or above for 30 minutes daily and/or when sitting for 3-4 times a day throughout the day. Avoid standing for long periods of time. Exercise regularly - walking is good for circulation Wound Treatment Wound #13 - Lower Leg Wound Laterality: Left, Posterior Cleanser: Soap and Water 1 x Per Week/15 Days Discharge Instructions: May shower and wash wound with dial antibacterial soap and water prior to dressing change. Cleanser: Vashe 5.8 (oz) 1 x Per Week/15 Days Discharge Instructions: Cleanse the wound with Vashe prior to applying a clean dressing using gauze sponges, not tissue  or cotton balls. Peri-Wound Care: Sween Lotion (Moisturizing lotion)  1 x Per Week/15 Days Discharge Instructions: Apply moisturizing lotion as directed Prim Dressing: Hydrofera Blue Ready Transfer Foam, 4x5 (in/in) 1 x Per Week/15 Days ary Discharge Instructions: Apply to wound bed as instructed Secondary Dressing: ABD Pad, 8x10 1 x Per Week/15 Days Discharge Instructions: Apply over primary dressing as directed. Secondary Dressing: Optifoam Non-Adhesive Dressing, 4x4 in 1 x Per Week/15 Days Discharge Instructions: Apply to ankle for protection. Compression Wrap: Urgo K2, (equivalent to a 4 layer) two layer compression system, regular 1 x Per Week/15 Days Discharge Instructions: Apply Urgo K2 as directed (alternative to 4 layer compression). Electronic Signature(s) Signed: 03/17/2023 4:26:34 PM By: Baltazar Najjar MD Signed: 03/17/2023 4:55:59 PM By: Brenton Grills Entered By: Brenton Grills on 03/17/2023 12:07:51 -------------------------------------------------------------------------------- Problem List Details Patient Name: Date of Service: [name], [name] 03/17/2023 2:30 PM Medical Record Number: [id number] Patient Account Number: [id number] Date of Birth/Sex: Treating RN: 02-07-1956 (67 y.o. M) Primary Care Provider: Hillard Danker Other Clinician: Referring Provider: Treating Provider/Extender: Shara Blazing in Treatment: 2 Active Problems ICD-10 Encounter Code Description Active Date MDM Diagnosis L97.828 Non-pressure chronic ulcer of other part of left lower leg with other specified 03/03/2023 No Yes severity I87.332 Chronic venous hypertension (idiopathic) with ulcer and inflammation of left 03/03/2023 No Yes lower extremity E11.622 Type 2 diabetes mellitus with other skin ulcer 03/03/2023 No Yes Inactive Problems Resolved Problems JAYMIR, STRUBLE ([id number]) [phone].pdf Page 5 of 8 Electronic  Signature(s) Signed: 03/17/2023 4:26:34 PM By: Baltazar Najjar MD Entered By: Baltazar Najjar on 03/17/2023 12:24:52 -------------------------------------------------------------------------------- Progress Note Details Patient Name: Date of Service: [name] 03/17/2023 2:30 PM Medical Record Number: [id number] Patient Account Number: [id number] Date of Birth/Sex: Treating RN: 1955-09-08 (67 y.o. M) Primary Care Provider: Hillard Danker Other Clinician: Referring Provider: Treating Provider/Extender: Shara Blazing in Treatment: 2 Subjective History of Present Illness (HPI) ADMISSION 07/29/2018 Mr. Recupero is a 67 year old man with either prediabetes or diabetes he is on glipizide. In late October to November 2019 he noted a scabbed area on the back of his right calf. He picked this off a few times but it would not heal. He saw his primary physician on 12/5. It was felt at that time that this may actually heal on its own with conservative management. The next visit was on 07/20/2018 noted the area was a lot worse and arranged for his treatment here. He has been topical antibiotics like Neosporin although he stopped using this when the wound looked worse. He is just been covering this with a clean Band-Aid. He is given Bactrim a week ago and he is finishing this currently. It is made some improvement in the surrounding erythema per the patient. He does not have a history of nonhealing wounds. No prior history of wounds on his legs that he had difficulty healing. No prior skin issues. 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 Past medical history; A. fib status post ablation and recent cardioversion he is on Xarelto. He has a history of systolic heart failure, cervical radiculopathy, cardiomyopathy and type 2 diabetes as discussed ABI in the right leg was noncompressible on the right 2/20; the biopsy I did on the patient last week was  negative for malignancy. Culture grew Pseudomonas and methicillin sensitive staph aureus. He is on cefdinir 300 twice a day. I will have to make this a 10 day course. He put him in compression. He states that the redness pain and erythema are

## 2023-03-17 NOTE — Progress Notes (Signed)
[employer] Facilities manager) Signed: 03/17/2023 3:27:15 PM By: Fonnie Mu RN Entered By: Fonnie Mu on 03/17/2023 11:28:58 -------------------------------------------------------------------------------- Vitals Details Patient Name: Date of Service: [name], [name] 03/17/2023 2:30 PM Medical Record Number: [id number] Patient Account Number: [id number] Date of Birth/Sex: Treating RN: 1955/12/15 (67 y.o. [name], [name] Primary Care Moraima Burd: Hillard Danker Other Clinician: Referring Randell Detter: Treating Ellsie Violette/Extender: Shara Blazing in Treatment: 2 Vital Signs Time Taken: 14:19 Temperature (F): 97.9 Height (in): 73 Pulse (bpm): 66 Weight (lbs): 254 Respiratory Rate (breaths/min): 17 Body Mass Index (BMI): 33.5 Blood Pressure (mmHg): 144/90 Capillary Blood Glucose (mg/dl): 952 Reference Range: 80 - 120 mg / dl Electronic Signature(s) Signed: 03/17/2023 3:27:15 PM By: Fonnie Mu RN [name], Dalene Seltzer ([id number]) [id number] Page 8 of 8 Entered By: Fonnie Mu on 03/17/2023 11:20:13  ANDER, WAMSER ([id number]) [id number] Page 1 of 8 Visit Report for 03/17/2023 Arrival Information Details Patient Name: Date of Service: DUGLAS, HEIER 03/17/2023 2:30 PM Medical Record Number: [id number] Patient Account Number: [id number] Date of Birth/Sex: Treating RN: 01/30/1956 (67 y.o. [name], [name] Primary Care Giovana Faciane: Hillard Danker Other Clinician: Referring Amairany Schumpert: Treating Diahann Guajardo/Extender: Shara Blazing in Treatment: 2 Visit Information History Since Last Visit Added or deleted any medications: No Patient Arrived: Ambulatory Any new allergies or adverse reactions: No Arrival Time: 14:18 Had a fall or experienced change in No Accompanied By: self activities of daily living that may affect Transfer Assistance: None risk of falls: Patient Identification Verified: Yes Signs or symptoms of abuse/neglect since last visito No Secondary Verification Process Completed: Yes Hospitalized since last visit: No Patient Has Alerts: Yes Implantable device outside of the clinic excluding No Patient Alerts: Patient on Blood Thinner cellular tissue based products placed in the center since last visit: Has Dressing in Place as Prescribed: Yes Has Compression in Place as Prescribed: Yes Pain Present Now: No Electronic Signature(s) Signed: 03/17/2023 3:27:15 PM By: Fonnie Mu RN Entered By: Fonnie Mu on 03/17/2023 11:18:46 -------------------------------------------------------------------------------- Compression Therapy Details Patient Name: Date of Service: [name] 03/17/2023 2:30 PM Medical Record Number: [id number] Patient Account Number: [id number] Date of Birth/Sex: Treating RN: 1956-03-29 (67 y.o. Yates Decamp Primary Care Mitzie Marlar: Hillard Danker Other Clinician: Referring Trystyn Dolley: Treating Alisi Lupien/Extender: Shara Blazing in Treatment:  2 Compression Therapy Performed for Wound Assessment: Wound #13 Left,Posterior Lower Leg Performed By: Clinician Brenton Grills, RN Compression Type: Four Layer Post Procedure Diagnosis Same as Pre-procedure Electronic Signature(s) Signed: 03/17/2023 4:55:59 PM By: Brenton Grills Entered By: Brenton Grills on 03/17/2023 12:19:56 [name] ([id number]) [id number] Page 2 of 8 -------------------------------------------------------------------------------- Encounter Discharge Information Details Patient Name: Date of Service: ELLISON, RIETH 03/17/2023 2:30 PM Medical Record Number: [id number] Patient Account Number: [id number] Date of Birth/Sex: Treating RN: 11-10-1955 (67 y.o. Yates Decamp Primary Care Maigan Bittinger: Hillard Danker Other Clinician: Referring Lesette Frary: Treating Demetri Goshert/Extender: Shara Blazing in Treatment: 2 Encounter Discharge Information Items Discharge Condition: Stable Ambulatory Status: Ambulatory Discharge Destination: Home Transportation: Private Auto Accompanied By: self Schedule Follow-up Appointment: Yes Clinical Summary of Care: Patient Declined Electronic Signature(s) Signed: 03/17/2023 4:55:59 PM By: Brenton Grills Entered By: Brenton Grills on 03/17/2023 12:21:03 -------------------------------------------------------------------------------- Lower Extremity Assessment Details Patient Name: Date of Service: SARIM, ROTHMAN 03/17/2023 2:30 PM Medical Record Number: [id number] Patient Account Number: [id number] Date of Birth/Sex: Treating RN: 04/05/1956 (67 y.o. [name], [name] Primary Care Alfonso Shackett: Hillard Danker Other Clinician: Referring Evalene Vath: Treating Weslie Pretlow/Extender: Shara Blazing in Treatment: 2 Edema Assessment Assessed: [name]: Yes] Franne Forts: No] [Left: Edema] [Right: :] Calf Left: Right: Point of Measurement: 33 cm From Medial  Instep 38.7 cm Ankle Left: Right: Point of Measurement: 12 cm From Medial Instep 25.4 cm Vascular Assessment Pulses: Dorsalis Pedis Palpable: [Left:Yes] Posterior Tibial Palpable: [Left:Yes] Extremity colors, hair growth, and conditions: Extremity Color: [Left:Hyperpigmented] Hair Growth on Extremity: [Left:No] Temperature of Extremity: [Left:Warm < 3 seconds] Electronic Signature(s) Signed: 03/17/2023 3:27:15 PM By: Fonnie Mu RN Entered By: Fonnie Mu on 03/17/2023 11:25:32 Swails, Dalene Seltzer ([id number]) [id number] Page 3 of 8 -------------------------------------------------------------------------------- Multi Wound Chart Details Patient Name: Date of Service: PRATYUSH, AMMON 03/17/2023 2:30 PM Medical Record Number: [id number] Patient Account Number: [id number] Date of Birth/Sex: Treating RN: 1956/04/14 (67 y.o. M) Primary Care Myriam Brandhorst: Hillard Danker Other  ANDER, WAMSER ([id number]) [id number] Page 1 of 8 Visit Report for 03/17/2023 Arrival Information Details Patient Name: Date of Service: DUGLAS, HEIER 03/17/2023 2:30 PM Medical Record Number: [id number] Patient Account Number: [id number] Date of Birth/Sex: Treating RN: 01/30/1956 (67 y.o. [name], [name] Primary Care Giovana Faciane: Hillard Danker Other Clinician: Referring Amairany Schumpert: Treating Diahann Guajardo/Extender: Shara Blazing in Treatment: 2 Visit Information History Since Last Visit Added or deleted any medications: No Patient Arrived: Ambulatory Any new allergies or adverse reactions: No Arrival Time: 14:18 Had a fall or experienced change in No Accompanied By: self activities of daily living that may affect Transfer Assistance: None risk of falls: Patient Identification Verified: Yes Signs or symptoms of abuse/neglect since last visito No Secondary Verification Process Completed: Yes Hospitalized since last visit: No Patient Has Alerts: Yes Implantable device outside of the clinic excluding No Patient Alerts: Patient on Blood Thinner cellular tissue based products placed in the center since last visit: Has Dressing in Place as Prescribed: Yes Has Compression in Place as Prescribed: Yes Pain Present Now: No Electronic Signature(s) Signed: 03/17/2023 3:27:15 PM By: Fonnie Mu RN Entered By: Fonnie Mu on 03/17/2023 11:18:46 -------------------------------------------------------------------------------- Compression Therapy Details Patient Name: Date of Service: [name] 03/17/2023 2:30 PM Medical Record Number: [id number] Patient Account Number: [id number] Date of Birth/Sex: Treating RN: 1956-03-29 (67 y.o. Yates Decamp Primary Care Mitzie Marlar: Hillard Danker Other Clinician: Referring Trystyn Dolley: Treating Alisi Lupien/Extender: Shara Blazing in Treatment:  2 Compression Therapy Performed for Wound Assessment: Wound #13 Left,Posterior Lower Leg Performed By: Clinician Brenton Grills, RN Compression Type: Four Layer Post Procedure Diagnosis Same as Pre-procedure Electronic Signature(s) Signed: 03/17/2023 4:55:59 PM By: Brenton Grills Entered By: Brenton Grills on 03/17/2023 12:19:56 [name] ([id number]) [id number] Page 2 of 8 -------------------------------------------------------------------------------- Encounter Discharge Information Details Patient Name: Date of Service: ELLISON, RIETH 03/17/2023 2:30 PM Medical Record Number: [id number] Patient Account Number: [id number] Date of Birth/Sex: Treating RN: 11-10-1955 (67 y.o. Yates Decamp Primary Care Maigan Bittinger: Hillard Danker Other Clinician: Referring Lesette Frary: Treating Demetri Goshert/Extender: Shara Blazing in Treatment: 2 Encounter Discharge Information Items Discharge Condition: Stable Ambulatory Status: Ambulatory Discharge Destination: Home Transportation: Private Auto Accompanied By: self Schedule Follow-up Appointment: Yes Clinical Summary of Care: Patient Declined Electronic Signature(s) Signed: 03/17/2023 4:55:59 PM By: Brenton Grills Entered By: Brenton Grills on 03/17/2023 12:21:03 -------------------------------------------------------------------------------- Lower Extremity Assessment Details Patient Name: Date of Service: SARIM, ROTHMAN 03/17/2023 2:30 PM Medical Record Number: [id number] Patient Account Number: [id number] Date of Birth/Sex: Treating RN: 04/05/1956 (67 y.o. [name], [name] Primary Care Alfonso Shackett: Hillard Danker Other Clinician: Referring Evalene Vath: Treating Weslie Pretlow/Extender: Shara Blazing in Treatment: 2 Edema Assessment Assessed: [name]: Yes] Franne Forts: No] [Left: Edema] [Right: :] Calf Left: Right: Point of Measurement: 33 cm From Medial  Instep 38.7 cm Ankle Left: Right: Point of Measurement: 12 cm From Medial Instep 25.4 cm Vascular Assessment Pulses: Dorsalis Pedis Palpable: [Left:Yes] Posterior Tibial Palpable: [Left:Yes] Extremity colors, hair growth, and conditions: Extremity Color: [Left:Hyperpigmented] Hair Growth on Extremity: [Left:No] Temperature of Extremity: [Left:Warm < 3 seconds] Electronic Signature(s) Signed: 03/17/2023 3:27:15 PM By: Fonnie Mu RN Entered By: Fonnie Mu on 03/17/2023 11:25:32 Swails, Dalene Seltzer ([id number]) [id number] Page 3 of 8 -------------------------------------------------------------------------------- Multi Wound Chart Details Patient Name: Date of Service: PRATYUSH, AMMON 03/17/2023 2:30 PM Medical Record Number: [id number] Patient Account Number: [id number] Date of Birth/Sex: Treating RN: 1956/04/14 (67 y.o. M) Primary Care Myriam Brandhorst: Hillard Danker Other  ANDER, WAMSER ([id number]) [id number] Page 1 of 8 Visit Report for 03/17/2023 Arrival Information Details Patient Name: Date of Service: DUGLAS, HEIER 03/17/2023 2:30 PM Medical Record Number: [id number] Patient Account Number: [id number] Date of Birth/Sex: Treating RN: 01/30/1956 (67 y.o. [name], [name] Primary Care Giovana Faciane: Hillard Danker Other Clinician: Referring Amairany Schumpert: Treating Diahann Guajardo/Extender: Shara Blazing in Treatment: 2 Visit Information History Since Last Visit Added or deleted any medications: No Patient Arrived: Ambulatory Any new allergies or adverse reactions: No Arrival Time: 14:18 Had a fall or experienced change in No Accompanied By: self activities of daily living that may affect Transfer Assistance: None risk of falls: Patient Identification Verified: Yes Signs or symptoms of abuse/neglect since last visito No Secondary Verification Process Completed: Yes Hospitalized since last visit: No Patient Has Alerts: Yes Implantable device outside of the clinic excluding No Patient Alerts: Patient on Blood Thinner cellular tissue based products placed in the center since last visit: Has Dressing in Place as Prescribed: Yes Has Compression in Place as Prescribed: Yes Pain Present Now: No Electronic Signature(s) Signed: 03/17/2023 3:27:15 PM By: Fonnie Mu RN Entered By: Fonnie Mu on 03/17/2023 11:18:46 -------------------------------------------------------------------------------- Compression Therapy Details Patient Name: Date of Service: [name] 03/17/2023 2:30 PM Medical Record Number: [id number] Patient Account Number: [id number] Date of Birth/Sex: Treating RN: 1956-03-29 (67 y.o. Yates Decamp Primary Care Mitzie Marlar: Hillard Danker Other Clinician: Referring Trystyn Dolley: Treating Alisi Lupien/Extender: Shara Blazing in Treatment:  2 Compression Therapy Performed for Wound Assessment: Wound #13 Left,Posterior Lower Leg Performed By: Clinician Brenton Grills, RN Compression Type: Four Layer Post Procedure Diagnosis Same as Pre-procedure Electronic Signature(s) Signed: 03/17/2023 4:55:59 PM By: Brenton Grills Entered By: Brenton Grills on 03/17/2023 12:19:56 [name] ([id number]) [id number] Page 2 of 8 -------------------------------------------------------------------------------- Encounter Discharge Information Details Patient Name: Date of Service: ELLISON, RIETH 03/17/2023 2:30 PM Medical Record Number: [id number] Patient Account Number: [id number] Date of Birth/Sex: Treating RN: 11-10-1955 (67 y.o. Yates Decamp Primary Care Maigan Bittinger: Hillard Danker Other Clinician: Referring Lesette Frary: Treating Demetri Goshert/Extender: Shara Blazing in Treatment: 2 Encounter Discharge Information Items Discharge Condition: Stable Ambulatory Status: Ambulatory Discharge Destination: Home Transportation: Private Auto Accompanied By: self Schedule Follow-up Appointment: Yes Clinical Summary of Care: Patient Declined Electronic Signature(s) Signed: 03/17/2023 4:55:59 PM By: Brenton Grills Entered By: Brenton Grills on 03/17/2023 12:21:03 -------------------------------------------------------------------------------- Lower Extremity Assessment Details Patient Name: Date of Service: SARIM, ROTHMAN 03/17/2023 2:30 PM Medical Record Number: [id number] Patient Account Number: [id number] Date of Birth/Sex: Treating RN: 04/05/1956 (67 y.o. [name], [name] Primary Care Alfonso Shackett: Hillard Danker Other Clinician: Referring Evalene Vath: Treating Weslie Pretlow/Extender: Shara Blazing in Treatment: 2 Edema Assessment Assessed: [name]: Yes] Franne Forts: No] [Left: Edema] [Right: :] Calf Left: Right: Point of Measurement: 33 cm From Medial  Instep 38.7 cm Ankle Left: Right: Point of Measurement: 12 cm From Medial Instep 25.4 cm Vascular Assessment Pulses: Dorsalis Pedis Palpable: [Left:Yes] Posterior Tibial Palpable: [Left:Yes] Extremity colors, hair growth, and conditions: Extremity Color: [Left:Hyperpigmented] Hair Growth on Extremity: [Left:No] Temperature of Extremity: [Left:Warm < 3 seconds] Electronic Signature(s) Signed: 03/17/2023 3:27:15 PM By: Fonnie Mu RN Entered By: Fonnie Mu on 03/17/2023 11:25:32 Swails, Dalene Seltzer ([id number]) [id number] Page 3 of 8 -------------------------------------------------------------------------------- Multi Wound Chart Details Patient Name: Date of Service: PRATYUSH, AMMON 03/17/2023 2:30 PM Medical Record Number: [id number] Patient Account Number: [id number] Date of Birth/Sex: Treating RN: 1956/04/14 (67 y.o. M) Primary Care Myriam Brandhorst: Hillard Danker Other

## 2023-03-24 ENCOUNTER — Encounter (HOSPITAL_BASED_OUTPATIENT_CLINIC_OR_DEPARTMENT_OTHER): Payer: Medicare HMO | Admitting: Internal Medicine

## 2023-03-24 DIAGNOSIS — E11622 Type 2 diabetes mellitus with other skin ulcer: Secondary | ICD-10-CM | POA: Diagnosis not present

## 2023-03-24 DIAGNOSIS — L97828 Non-pressure chronic ulcer of other part of left lower leg with other specified severity: Secondary | ICD-10-CM | POA: Diagnosis not present

## 2023-03-24 DIAGNOSIS — I428 Other cardiomyopathies: Secondary | ICD-10-CM | POA: Diagnosis not present

## 2023-03-24 DIAGNOSIS — L97328 Non-pressure chronic ulcer of left ankle with other specified severity: Secondary | ICD-10-CM | POA: Diagnosis not present

## 2023-03-24 DIAGNOSIS — L97222 Non-pressure chronic ulcer of left calf with fat layer exposed: Secondary | ICD-10-CM | POA: Diagnosis not present

## 2023-03-24 DIAGNOSIS — E875 Hyperkalemia: Secondary | ICD-10-CM | POA: Diagnosis not present

## 2023-03-24 DIAGNOSIS — I480 Paroxysmal atrial fibrillation: Secondary | ICD-10-CM | POA: Diagnosis not present

## 2023-03-24 DIAGNOSIS — I87332 Chronic venous hypertension (idiopathic) with ulcer and inflammation of left lower extremity: Secondary | ICD-10-CM | POA: Diagnosis not present

## 2023-03-24 DIAGNOSIS — I110 Hypertensive heart disease with heart failure: Secondary | ICD-10-CM | POA: Diagnosis not present

## 2023-03-24 DIAGNOSIS — I872 Venous insufficiency (chronic) (peripheral): Secondary | ICD-10-CM | POA: Diagnosis not present

## 2023-03-25 NOTE — Progress Notes (Signed)
leg was involved in that study or not we will need to review that. His past medical history includes type type 2 diabetes, chronic venous insufficiency with secondary lymphedema, intermittent A-fib, nonischemic cardiomyopathy, thrombus of the left  atrial appendage, hereditary hemochromatosis cytosis. He is now on Coumadin presumably for the atrial fibrillation. MAJOR, SANTERRE ([id number]) [id number] Page 3 of 9 ABIs in this clinic were not repeated 9/24; posterior left calf wound. We used Hydrofera Blue under compression. The wound looks better this week better looking surface. 10/1. Posterior left calf wound secondary to venous insufficiency. He was new to the clinic in 2 weeks ago at which time I thought he was going to require a difficult set of mechanical debridements however the wound surface is continuing to improve using Hydrofera Blue although the dimensions not so remarkably improved today 10/8; posterior left calf wound secondary to chronic venous insufficiency. He continues to think nice progress here on this wound the wound is smaller better looking surface. He arrived in clinic today with an abrasion on his Achilles area on the left leg as well as what looked to be a potential deep tissue injury on the lateral lateral part of the left foot at the level of the base of the fifth metatarsal. Electronic Signature(s) Signed: 03/25/2023 9:33:55 AM By: Baltazar Najjar MD Entered By: Baltazar Najjar on 03/24/2023 12:26:56 -------------------------------------------------------------------------------- Physical Exam Details Patient Name: Date of Service: [name] 03/24/2023 1:30 PM Medical Record Number: [id number] Patient Account Number: [id number] Date of Birth/Sex: Treating RN: [birth year] (67 y.o. M) Primary Care Provider: Hillard Danker Other Clinician: Referring Provider: Treating Provider/Extender: Shara Blazing in Treatment: 3 Constitutional Sitting or standing Blood Pressure is within target range for patient.. Pulse regular and within target range for patient.[name] Respirations regular, non-labored and within target range.. Temperature is normal and within  the target range for the patient.[name] Appears in no distress. Notes Wound exam; posterior left calf continues to be improved in terms of dimensions and condition of the wound. No debridement is required. UNFORTUNATELY he appears to have an abrasion injury on the left Achilles area and perhaps a deep tissue developing injury on the left lateral foot. Both of these were require padding. Electronic Signature(s) Signed: 03/25/2023 9:33:55 AM By: Baltazar Najjar MD Entered By: Baltazar Najjar on 03/24/2023 12:28:23 -------------------------------------------------------------------------------- Physician Orders Details Patient Name: Date of Service: [name] 03/24/2023 1:30 PM Medical Record Number: [id number] Patient Account Number: [id number] Date of Birth/Sex: Treating RN: September 04, 1955 (67 y.o. [name] Primary Care Provider: Hillard Danker Other Clinician: Referring Provider: Treating Provider/Extender: Shara Blazing in Treatment: 3 The following information was scribed by: Shawn Stall The information was scribed for: Baltazar Najjar Verbal / Phone Orders: No Diagnosis Coding ICD-10 Coding Code Description L97.828 Non-pressure chronic ulcer of other part of left lower leg with other specified severity I87.332 Chronic venous hypertension (idiopathic) with ulcer and inflammation of left lower extremity E11.622 Type 2 diabetes mellitus with other skin ulcer CHON, BUHL ([id number]) [id number] Page 4 of 9 Follow-up Appointments ppointment in 1 week. - Dr Leanord Hawking Tuesday 03/31/2023 145pm Return A ppointment in 2 weeks. - Dr Leanord Hawking Tuesday 04/07/2023 115pm Return A Return appointment in 3 weeks. - Dr Leanord Hawking Tuesday (already scheduled) Return appointment in 1 month. - Dr Leanord Hawking Tuesday (front office to book appt) Anesthetic (In clinic) Topical Lidocaine 4% applied to wound bed Bathing/ Shower/ Hygiene May  shower with protection but do not get wound  dressing(s) wet. Protect dressing(s) with water repellant cover (for example, large plastic bag) or a cast cover and may then take shower. Edema Control - Lymphedema / SCD / Other Left Lower Extremity Elevate legs to the level of the heart or above for 30 minutes daily and/or when sitting for 3-4 times a day throughout the day. Avoid standing for long periods of time. Exercise regularly - walking is good for circulation Wound Treatment Wound #13 - Lower Leg Wound Laterality: Left, Posterior Cleanser: Soap and Water 1 x Per Week/15 Days Discharge Instructions: May shower and wash wound with dial antibacterial soap and water prior to dressing change. Cleanser: Vashe 5.8 (oz) 1 x Per Week/15 Days Discharge Instructions: Cleanse the wound with Vashe prior to applying a clean dressing using gauze sponges, not tissue or cotton balls. Peri-Wound Care: Sween Lotion (Moisturizing lotion) 1 x Per Week/15 Days Discharge Instructions: Apply moisturizing lotion as directed Prim Dressing: Promogran Prisma Matrix, 4.34 (sq in) (silver collagen) ary 1 x Per Week/15 Days Discharge Instructions: Moisten collagen with hydrogel Secondary Dressing: ABD Pad, 8x10 1 x Per Week/15 Days Discharge Instructions: Apply over primary dressing as directed. Secondary Dressing: Optifoam Non-Adhesive Dressing, 4x4 in 1 x Per Week/15 Days Discharge Instructions: Apply to ankle, achilles, lateral part of foot for protection Compression Wrap: Urgo K2, (equivalent to a 4 layer) two layer compression system, regular 1 x Per Week/15 Days Discharge Instructions: Apply Urgo K2 as directed (alternative to 4 layer compression). Electronic Signature(s) Signed: 03/24/2023 5:32:42 PM By: Shawn Stall RN, BSN Signed: 03/25/2023 9:33:55 AM By: Baltazar Najjar MD Entered By: Shawn Stall on 03/24/2023  10:59:55 -------------------------------------------------------------------------------- Problem List Details Patient Name: Date of Service: [name] 03/24/2023 1:30 PM Medical Record Number: [id number] Patient Account Number: [id number] Date of Birth/Sex: Treating RN: 27-Dec-1955 (67 y.o. [name] Primary Care Provider: Hillard Danker Other Clinician: Referring Provider: Treating Provider/Extender: Shara Blazing in Treatment: 3 Active Problems ICD-10 Encounter Code Description Active Date MDM Diagnosis [phone] Non-pressure chronic ulcer of other part of left lower leg with other specified 03/03/2023 No Yes severity I87.332 Chronic venous hypertension (idiopathic) with ulcer and inflammation of left 03/03/2023 No Yes TAYSON, SCHNELLE ([id number]) [id number] Page 5 of 9 lower extremity E11.622 Type 2 diabetes mellitus with other skin ulcer 03/03/2023 No Yes Inactive Problems Resolved Problems Electronic Signature(s) Signed: 03/25/2023 9:33:55 AM By: Baltazar Najjar MD Entered By: Baltazar Najjar on 03/24/2023 12:25:37 -------------------------------------------------------------------------------- Progress Note Details Patient Name: Date of Service: [name] 03/24/2023 1:30 PM Medical Record Number: [id number] Patient Account Number: [id number] Date of Birth/Sex: Treating RN: 1956/04/23 (67 y.o. M) Primary Care Provider: Hillard Danker Other Clinician: Referring Provider: Treating Provider/Extender: Shara Blazing in Treatment: 3 Subjective History of Present Illness (HPI) ADMISSION 07/29/2018 Mr. Sciandra is a 67 year old man with either prediabetes or diabetes he is on glipizide. In late October to November 2019 he noted a scabbed area on the back of his right calf. He picked this off a few times but it would not heal. He saw his primary physician on 12/5. It was  felt at that time that this may actually heal on its own with conservative management. The next visit was on 07/20/2018 noted the area was a lot worse and arranged for his treatment here. He has been topical antibiotics like Neosporin although he stopped using this when the wound looked worse. He is just been covering this with a clean Band-Aid. He is given Bactrim  SAMWISE, ECKARDT ([id number]) [id number] Page 1 of 9 Visit Report for 03/24/2023 HPI Details Patient Name: Date of Service: KYEN, TAITE 03/24/2023 1:30 PM Medical Record Number: [id number] Patient Account Number: [id number] Date of Birth/Sex: Treating RN: October 02, 1955 (67 y.o. M) Primary Care Provider: Hillard Danker Other Clinician: Referring Provider: Treating Provider/Extender: Shara Blazing in Treatment: 3 History of Present Illness HPI Description: ADMISSION 07/29/2018 Mr. Goede is a 67 year old man with either prediabetes or diabetes he is on glipizide. In late October to November 2019 he noted a scabbed area on the back of his right calf. He picked this off a few times but it would not heal. He saw his primary physician on 12/5. It was felt at that time that this may actually heal on its own with conservative management. The next visit was on 07/20/2018 noted the area was a lot worse and arranged for his treatment here. He has been topical antibiotics like Neosporin although he stopped using this when the wound looked worse. He is just been covering this with a clean Band-Aid. He is given Bactrim a week ago and he is finishing this currently. It is made some improvement in the surrounding erythema per the patient. He does not have a history of nonhealing wounds. No prior history of wounds on his legs that he had difficulty healing. No prior skin issues. 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 Past medical history; A. fib status post ablation and recent cardioversion he is on Xarelto. He has a history of systolic heart failure, cervical radiculopathy, cardiomyopathy and type 2 diabetes as discussed ABI in the right leg was noncompressible on the right 2/20; the biopsy I did on the patient last week was negative for malignancy. Culture grew Pseudomonas and methicillin sensitive staph aureus. He is on cefdinir  300 twice a day. I will have to make this a 10 day course. He put him in compression. He states that the redness pain and erythema are a lot better. I suspect the patient has chronic venous insufficiency probably with a secondary cellulitis 2/27; arrives today with copious amounts of drainage coming out of the wound irritating the skin below the wound area. He only renewed the final 3 days of cefdinir today 3/5; came in for a nurse change 3 days ago. Again a lot of drainage noted. Zinc oxide was applied unfortunately the drainage appears to a pool then he has a string of superficial open areas extending down into the Achilles area and some just below the wound. The wound itself does not look too bad. He is completed the antibiotics although apparently there was separation from the original 7 with the last 3 days. Culture grew a few MSSA and a few Pseudomonas 3/12; not too much difference over the last week. He came in for a dressing change on Monday by our nursing staff. Necrotic debris again over the wound surface. The entire area looks irritated but nontender and I do not think shows obvious evidence of infection. We have not heard anything about the reflux studies. Also notable than in this diabetic man we had noncompressible vessels and although I can feel his pulses easily in his feet I will order arterial studies as well. 3/19-Patient had experience more pain has been dressed with a silver alginate, the wounds all have necrotic debris, very friable with easy bleeding with any kind of surface debridement including with gauze and Anasept and with a #3 curette. His vascular  SAMWISE, ECKARDT ([id number]) [id number] Page 1 of 9 Visit Report for 03/24/2023 HPI Details Patient Name: Date of Service: KYEN, TAITE 03/24/2023 1:30 PM Medical Record Number: [id number] Patient Account Number: [id number] Date of Birth/Sex: Treating RN: October 02, 1955 (67 y.o. M) Primary Care Provider: Hillard Danker Other Clinician: Referring Provider: Treating Provider/Extender: Shara Blazing in Treatment: 3 History of Present Illness HPI Description: ADMISSION 07/29/2018 Mr. Goede is a 67 year old man with either prediabetes or diabetes he is on glipizide. In late October to November 2019 he noted a scabbed area on the back of his right calf. He picked this off a few times but it would not heal. He saw his primary physician on 12/5. It was felt at that time that this may actually heal on its own with conservative management. The next visit was on 07/20/2018 noted the area was a lot worse and arranged for his treatment here. He has been topical antibiotics like Neosporin although he stopped using this when the wound looked worse. He is just been covering this with a clean Band-Aid. He is given Bactrim a week ago and he is finishing this currently. It is made some improvement in the surrounding erythema per the patient. He does not have a history of nonhealing wounds. No prior history of wounds on his legs that he had difficulty healing. No prior skin issues. 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 Past medical history; A. fib status post ablation and recent cardioversion he is on Xarelto. He has a history of systolic heart failure, cervical radiculopathy, cardiomyopathy and type 2 diabetes as discussed ABI in the right leg was noncompressible on the right 2/20; the biopsy I did on the patient last week was negative for malignancy. Culture grew Pseudomonas and methicillin sensitive staph aureus. He is on cefdinir  300 twice a day. I will have to make this a 10 day course. He put him in compression. He states that the redness pain and erythema are a lot better. I suspect the patient has chronic venous insufficiency probably with a secondary cellulitis 2/27; arrives today with copious amounts of drainage coming out of the wound irritating the skin below the wound area. He only renewed the final 3 days of cefdinir today 3/5; came in for a nurse change 3 days ago. Again a lot of drainage noted. Zinc oxide was applied unfortunately the drainage appears to a pool then he has a string of superficial open areas extending down into the Achilles area and some just below the wound. The wound itself does not look too bad. He is completed the antibiotics although apparently there was separation from the original 7 with the last 3 days. Culture grew a few MSSA and a few Pseudomonas 3/12; not too much difference over the last week. He came in for a dressing change on Monday by our nursing staff. Necrotic debris again over the wound surface. The entire area looks irritated but nontender and I do not think shows obvious evidence of infection. We have not heard anything about the reflux studies. Also notable than in this diabetic man we had noncompressible vessels and although I can feel his pulses easily in his feet I will order arterial studies as well. 3/19-Patient had experience more pain has been dressed with a silver alginate, the wounds all have necrotic debris, very friable with easy bleeding with any kind of surface debridement including with gauze and Anasept and with a #3 curette. His vascular  SAMWISE, ECKARDT ([id number]) [id number] Page 1 of 9 Visit Report for 03/24/2023 HPI Details Patient Name: Date of Service: KYEN, TAITE 03/24/2023 1:30 PM Medical Record Number: [id number] Patient Account Number: [id number] Date of Birth/Sex: Treating RN: October 02, 1955 (67 y.o. M) Primary Care Provider: Hillard Danker Other Clinician: Referring Provider: Treating Provider/Extender: Shara Blazing in Treatment: 3 History of Present Illness HPI Description: ADMISSION 07/29/2018 Mr. Goede is a 67 year old man with either prediabetes or diabetes he is on glipizide. In late October to November 2019 he noted a scabbed area on the back of his right calf. He picked this off a few times but it would not heal. He saw his primary physician on 12/5. It was felt at that time that this may actually heal on its own with conservative management. The next visit was on 07/20/2018 noted the area was a lot worse and arranged for his treatment here. He has been topical antibiotics like Neosporin although he stopped using this when the wound looked worse. He is just been covering this with a clean Band-Aid. He is given Bactrim a week ago and he is finishing this currently. It is made some improvement in the surrounding erythema per the patient. He does not have a history of nonhealing wounds. No prior history of wounds on his legs that he had difficulty healing. No prior skin issues. 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 Past medical history; A. fib status post ablation and recent cardioversion he is on Xarelto. He has a history of systolic heart failure, cervical radiculopathy, cardiomyopathy and type 2 diabetes as discussed ABI in the right leg was noncompressible on the right 2/20; the biopsy I did on the patient last week was negative for malignancy. Culture grew Pseudomonas and methicillin sensitive staph aureus. He is on cefdinir  300 twice a day. I will have to make this a 10 day course. He put him in compression. He states that the redness pain and erythema are a lot better. I suspect the patient has chronic venous insufficiency probably with a secondary cellulitis 2/27; arrives today with copious amounts of drainage coming out of the wound irritating the skin below the wound area. He only renewed the final 3 days of cefdinir today 3/5; came in for a nurse change 3 days ago. Again a lot of drainage noted. Zinc oxide was applied unfortunately the drainage appears to a pool then he has a string of superficial open areas extending down into the Achilles area and some just below the wound. The wound itself does not look too bad. He is completed the antibiotics although apparently there was separation from the original 7 with the last 3 days. Culture grew a few MSSA and a few Pseudomonas 3/12; not too much difference over the last week. He came in for a dressing change on Monday by our nursing staff. Necrotic debris again over the wound surface. The entire area looks irritated but nontender and I do not think shows obvious evidence of infection. We have not heard anything about the reflux studies. Also notable than in this diabetic man we had noncompressible vessels and although I can feel his pulses easily in his feet I will order arterial studies as well. 3/19-Patient had experience more pain has been dressed with a silver alginate, the wounds all have necrotic debris, very friable with easy bleeding with any kind of surface debridement including with gauze and Anasept and with a #3 curette. His vascular  Per Week/15 Days Discharge Instructions: Apply moisturizing lotion as directed Prim Dressing: Promogran Prisma Matrix, 4.34 (sq in) (silver collagen) 1 x Per Week/15 Days ary Discharge Instructions: Moisten collagen with hydrogel Secondary Dressing: ABD Pad, 8x10 1 x Per Week/15 Days Discharge Instructions: Apply over primary dressing as directed. Secondary Dressing: Optifoam Non-Adhesive Dressing, 4x4 in 1 x Per Week/15 Days Discharge Instructions: Apply to ankle, achilles, lateral part of foot for protection Com pression Wrap: Urgo K2, (equivalent to a 4 layer) two layer compression system, regular 1 x Per Week/15 Days Discharge Instructions: Apply Urgo K2 as directed (alternative to 4 layer compression). 1. Hydrofera Blue stuck to the wound bed we changed to Prisma hydrogel ABDs. 2. Padding to the abrasions on the Achilles and the lateral foot. #3 Urgo K2 compression Electronic Signature(s) Signed: 03/25/2023 9:33:55 AM By: Baltazar Najjar MD Entered By: Baltazar Najjar on 03/24/2023 12:30:44 -------------------------------------------------------------------------------- SuperBill Details Patient Name: Date of Service: [name] 03/24/2023 Medical Record Number: [id number] Patient Account Number: [id number] Date of Birth/Sex: Treating RN: February 23, 1956 (67 y.o. [name] Primary Care Provider: Hillard Danker Other Clinician: Referring Provider: Treating Provider/Extender: Shara Blazing in Treatment: 3 Diagnosis Coding ICD-10 Codes Code Description [phone] Non-pressure chronic ulcer of other part of left lower leg with other specified severity I87.332 Chronic venous hypertension (idiopathic) with ulcer and inflammation of left lower extremity E11.622 Type 2 diabetes mellitus with other skin ulcer Facility Procedures : CPT4 Code:  [id number] ( Description: Facility Use Only) [phone] - APPLY MULTLAY COMPRS LWR LT LEG Modifier: Quantity: 1 Physician Procedures : CPT4 Code Description [address] DEIONTE, SPIVACK ([id number]) [id number] - WC PHYS LEVEL 3 - EST PT 1 ICD-10 Diagnosis Description L97.828 Non-pressure chronic ulcer of other part of left lower leg with other  specified severity I87.332 Chronic venous hypertension (idiopathic) with ulcer and inflammation of left lower extremity E11.622 Type 2 diabetes mellitus with other skin ulcer Quantity: f Page 9 of 9 Electronic Signature(s) Signed: 03/25/2023 9:33:55 AM By: Baltazar Najjar MD Entered By: Baltazar Najjar on 03/24/2023 12:31:07  leg was involved in that study or not we will need to review that. His past medical history includes type type 2 diabetes, chronic venous insufficiency with secondary lymphedema, intermittent A-fib, nonischemic cardiomyopathy, thrombus of the left  atrial appendage, hereditary hemochromatosis cytosis. He is now on Coumadin presumably for the atrial fibrillation. MAJOR, SANTERRE ([id number]) [id number] Page 3 of 9 ABIs in this clinic were not repeated 9/24; posterior left calf wound. We used Hydrofera Blue under compression. The wound looks better this week better looking surface. 10/1. Posterior left calf wound secondary to venous insufficiency. He was new to the clinic in 2 weeks ago at which time I thought he was going to require a difficult set of mechanical debridements however the wound surface is continuing to improve using Hydrofera Blue although the dimensions not so remarkably improved today 10/8; posterior left calf wound secondary to chronic venous insufficiency. He continues to think nice progress here on this wound the wound is smaller better looking surface. He arrived in clinic today with an abrasion on his Achilles area on the left leg as well as what looked to be a potential deep tissue injury on the lateral lateral part of the left foot at the level of the base of the fifth metatarsal. Electronic Signature(s) Signed: 03/25/2023 9:33:55 AM By: Baltazar Najjar MD Entered By: Baltazar Najjar on 03/24/2023 12:26:56 -------------------------------------------------------------------------------- Physical Exam Details Patient Name: Date of Service: [name] 03/24/2023 1:30 PM Medical Record Number: [id number] Patient Account Number: [id number] Date of Birth/Sex: Treating RN: [birth year] (67 y.o. M) Primary Care Provider: Hillard Danker Other Clinician: Referring Provider: Treating Provider/Extender: Shara Blazing in Treatment: 3 Constitutional Sitting or standing Blood Pressure is within target range for patient.. Pulse regular and within target range for patient.[name] Respirations regular, non-labored and within target range.. Temperature is normal and within  the target range for the patient.[name] Appears in no distress. Notes Wound exam; posterior left calf continues to be improved in terms of dimensions and condition of the wound. No debridement is required. UNFORTUNATELY he appears to have an abrasion injury on the left Achilles area and perhaps a deep tissue developing injury on the left lateral foot. Both of these were require padding. Electronic Signature(s) Signed: 03/25/2023 9:33:55 AM By: Baltazar Najjar MD Entered By: Baltazar Najjar on 03/24/2023 12:28:23 -------------------------------------------------------------------------------- Physician Orders Details Patient Name: Date of Service: [name] 03/24/2023 1:30 PM Medical Record Number: [id number] Patient Account Number: [id number] Date of Birth/Sex: Treating RN: September 04, 1955 (67 y.o. [name] Primary Care Provider: Hillard Danker Other Clinician: Referring Provider: Treating Provider/Extender: Shara Blazing in Treatment: 3 The following information was scribed by: Shawn Stall The information was scribed for: Baltazar Najjar Verbal / Phone Orders: No Diagnosis Coding ICD-10 Coding Code Description L97.828 Non-pressure chronic ulcer of other part of left lower leg with other specified severity I87.332 Chronic venous hypertension (idiopathic) with ulcer and inflammation of left lower extremity E11.622 Type 2 diabetes mellitus with other skin ulcer CHON, BUHL ([id number]) [id number].pdf Page 4 of 9 Follow-up Appointments ppointment in 1 week. - Dr Leanord Hawking Tuesday 03/31/2023 145pm Return A ppointment in 2 weeks. - Dr Leanord Hawking Tuesday 04/07/2023 115pm Return A Return appointment in 3 weeks. - Dr Leanord Hawking Tuesday (already scheduled) Return appointment in 1 month. - Dr Leanord Hawking Tuesday (front office to book appt) Anesthetic (In clinic) Topical Lidocaine 4% applied to wound bed Bathing/ Shower/ Hygiene May  shower with protection but do not get wound  leg was involved in that study or not we will need to review that. His past medical history includes type type 2 diabetes, chronic venous insufficiency with secondary lymphedema, intermittent A-fib, nonischemic cardiomyopathy, thrombus of the left  atrial appendage, hereditary hemochromatosis cytosis. He is now on Coumadin presumably for the atrial fibrillation. MAJOR, SANTERRE ([id number]) [id number] Page 3 of 9 ABIs in this clinic were not repeated 9/24; posterior left calf wound. We used Hydrofera Blue under compression. The wound looks better this week better looking surface. 10/1. Posterior left calf wound secondary to venous insufficiency. He was new to the clinic in 2 weeks ago at which time I thought he was going to require a difficult set of mechanical debridements however the wound surface is continuing to improve using Hydrofera Blue although the dimensions not so remarkably improved today 10/8; posterior left calf wound secondary to chronic venous insufficiency. He continues to think nice progress here on this wound the wound is smaller better looking surface. He arrived in clinic today with an abrasion on his Achilles area on the left leg as well as what looked to be a potential deep tissue injury on the lateral lateral part of the left foot at the level of the base of the fifth metatarsal. Electronic Signature(s) Signed: 03/25/2023 9:33:55 AM By: Baltazar Najjar MD Entered By: Baltazar Najjar on 03/24/2023 12:26:56 -------------------------------------------------------------------------------- Physical Exam Details Patient Name: Date of Service: [name] 03/24/2023 1:30 PM Medical Record Number: [id number] Patient Account Number: [id number] Date of Birth/Sex: Treating RN: [birth year] (67 y.o. M) Primary Care Provider: Hillard Danker Other Clinician: Referring Provider: Treating Provider/Extender: Shara Blazing in Treatment: 3 Constitutional Sitting or standing Blood Pressure is within target range for patient.. Pulse regular and within target range for patient.[name] Respirations regular, non-labored and within target range.. Temperature is normal and within  the target range for the patient.[name] Appears in no distress. Notes Wound exam; posterior left calf continues to be improved in terms of dimensions and condition of the wound. No debridement is required. UNFORTUNATELY he appears to have an abrasion injury on the left Achilles area and perhaps a deep tissue developing injury on the left lateral foot. Both of these were require padding. Electronic Signature(s) Signed: 03/25/2023 9:33:55 AM By: Baltazar Najjar MD Entered By: Baltazar Najjar on 03/24/2023 12:28:23 -------------------------------------------------------------------------------- Physician Orders Details Patient Name: Date of Service: [name] 03/24/2023 1:30 PM Medical Record Number: [id number] Patient Account Number: [id number] Date of Birth/Sex: Treating RN: September 04, 1955 (67 y.o. [name] Primary Care Provider: Hillard Danker Other Clinician: Referring Provider: Treating Provider/Extender: Shara Blazing in Treatment: 3 The following information was scribed by: Shawn Stall The information was scribed for: Baltazar Najjar Verbal / Phone Orders: No Diagnosis Coding ICD-10 Coding Code Description L97.828 Non-pressure chronic ulcer of other part of left lower leg with other specified severity I87.332 Chronic venous hypertension (idiopathic) with ulcer and inflammation of left lower extremity E11.622 Type 2 diabetes mellitus with other skin ulcer CHON, BUHL ([id number]) [id number].pdf Page 4 of 9 Follow-up Appointments ppointment in 1 week. - Dr Leanord Hawking Tuesday 03/31/2023 145pm Return A ppointment in 2 weeks. - Dr Leanord Hawking Tuesday 04/07/2023 115pm Return A Return appointment in 3 weeks. - Dr Leanord Hawking Tuesday (already scheduled) Return appointment in 1 month. - Dr Leanord Hawking Tuesday (front office to book appt) Anesthetic (In clinic) Topical Lidocaine 4% applied to wound bed Bathing/ Shower/ Hygiene May  shower with protection but do not get wound  SAMWISE, ECKARDT ([id number]) [id number] Page 1 of 9 Visit Report for 03/24/2023 HPI Details Patient Name: Date of Service: KYEN, TAITE 03/24/2023 1:30 PM Medical Record Number: [id number] Patient Account Number: [id number] Date of Birth/Sex: Treating RN: October 02, 1955 (67 y.o. M) Primary Care Provider: Hillard Danker Other Clinician: Referring Provider: Treating Provider/Extender: Shara Blazing in Treatment: 3 History of Present Illness HPI Description: ADMISSION 07/29/2018 Mr. Goede is a 67 year old man with either prediabetes or diabetes he is on glipizide. In late October to November 2019 he noted a scabbed area on the back of his right calf. He picked this off a few times but it would not heal. He saw his primary physician on 12/5. It was felt at that time that this may actually heal on its own with conservative management. The next visit was on 07/20/2018 noted the area was a lot worse and arranged for his treatment here. He has been topical antibiotics like Neosporin although he stopped using this when the wound looked worse. He is just been covering this with a clean Band-Aid. He is given Bactrim a week ago and he is finishing this currently. It is made some improvement in the surrounding erythema per the patient. He does not have a history of nonhealing wounds. No prior history of wounds on his legs that he had difficulty healing. No prior skin issues. 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 Past medical history; A. fib status post ablation and recent cardioversion he is on Xarelto. He has a history of systolic heart failure, cervical radiculopathy, cardiomyopathy and type 2 diabetes as discussed ABI in the right leg was noncompressible on the right 2/20; the biopsy I did on the patient last week was negative for malignancy. Culture grew Pseudomonas and methicillin sensitive staph aureus. He is on cefdinir  300 twice a day. I will have to make this a 10 day course. He put him in compression. He states that the redness pain and erythema are a lot better. I suspect the patient has chronic venous insufficiency probably with a secondary cellulitis 2/27; arrives today with copious amounts of drainage coming out of the wound irritating the skin below the wound area. He only renewed the final 3 days of cefdinir today 3/5; came in for a nurse change 3 days ago. Again a lot of drainage noted. Zinc oxide was applied unfortunately the drainage appears to a pool then he has a string of superficial open areas extending down into the Achilles area and some just below the wound. The wound itself does not look too bad. He is completed the antibiotics although apparently there was separation from the original 7 with the last 3 days. Culture grew a few MSSA and a few Pseudomonas 3/12; not too much difference over the last week. He came in for a dressing change on Monday by our nursing staff. Necrotic debris again over the wound surface. The entire area looks irritated but nontender and I do not think shows obvious evidence of infection. We have not heard anything about the reflux studies. Also notable than in this diabetic man we had noncompressible vessels and although I can feel his pulses easily in his feet I will order arterial studies as well. 3/19-Patient had experience more pain has been dressed with a silver alginate, the wounds all have necrotic debris, very friable with easy bleeding with any kind of surface debridement including with gauze and Anasept and with a #3 curette. His vascular  leg was involved in that study or not we will need to review that. His past medical history includes type type 2 diabetes, chronic venous insufficiency with secondary lymphedema, intermittent A-fib, nonischemic cardiomyopathy, thrombus of the left  atrial appendage, hereditary hemochromatosis cytosis. He is now on Coumadin presumably for the atrial fibrillation. MAJOR, SANTERRE ([id number]) [id number] Page 3 of 9 ABIs in this clinic were not repeated 9/24; posterior left calf wound. We used Hydrofera Blue under compression. The wound looks better this week better looking surface. 10/1. Posterior left calf wound secondary to venous insufficiency. He was new to the clinic in 2 weeks ago at which time I thought he was going to require a difficult set of mechanical debridements however the wound surface is continuing to improve using Hydrofera Blue although the dimensions not so remarkably improved today 10/8; posterior left calf wound secondary to chronic venous insufficiency. He continues to think nice progress here on this wound the wound is smaller better looking surface. He arrived in clinic today with an abrasion on his Achilles area on the left leg as well as what looked to be a potential deep tissue injury on the lateral lateral part of the left foot at the level of the base of the fifth metatarsal. Electronic Signature(s) Signed: 03/25/2023 9:33:55 AM By: Baltazar Najjar MD Entered By: Baltazar Najjar on 03/24/2023 12:26:56 -------------------------------------------------------------------------------- Physical Exam Details Patient Name: Date of Service: [name] 03/24/2023 1:30 PM Medical Record Number: [id number] Patient Account Number: [id number] Date of Birth/Sex: Treating RN: [birth year] (67 y.o. M) Primary Care Provider: Hillard Danker Other Clinician: Referring Provider: Treating Provider/Extender: Shara Blazing in Treatment: 3 Constitutional Sitting or standing Blood Pressure is within target range for patient.. Pulse regular and within target range for patient.[name] Respirations regular, non-labored and within target range.. Temperature is normal and within  the target range for the patient.[name] Appears in no distress. Notes Wound exam; posterior left calf continues to be improved in terms of dimensions and condition of the wound. No debridement is required. UNFORTUNATELY he appears to have an abrasion injury on the left Achilles area and perhaps a deep tissue developing injury on the left lateral foot. Both of these were require padding. Electronic Signature(s) Signed: 03/25/2023 9:33:55 AM By: Baltazar Najjar MD Entered By: Baltazar Najjar on 03/24/2023 12:28:23 -------------------------------------------------------------------------------- Physician Orders Details Patient Name: Date of Service: [name] 03/24/2023 1:30 PM Medical Record Number: [id number] Patient Account Number: [id number] Date of Birth/Sex: Treating RN: September 04, 1955 (67 y.o. [name] Primary Care Provider: Hillard Danker Other Clinician: Referring Provider: Treating Provider/Extender: Shara Blazing in Treatment: 3 The following information was scribed by: Shawn Stall The information was scribed for: Baltazar Najjar Verbal / Phone Orders: No Diagnosis Coding ICD-10 Coding Code Description L97.828 Non-pressure chronic ulcer of other part of left lower leg with other specified severity I87.332 Chronic venous hypertension (idiopathic) with ulcer and inflammation of left lower extremity E11.622 Type 2 diabetes mellitus with other skin ulcer CHON, BUHL ([id number]) [id number].pdf Page 4 of 9 Follow-up Appointments ppointment in 1 week. - Dr Leanord Hawking Tuesday 03/31/2023 145pm Return A ppointment in 2 weeks. - Dr Leanord Hawking Tuesday 04/07/2023 115pm Return A Return appointment in 3 weeks. - Dr Leanord Hawking Tuesday (already scheduled) Return appointment in 1 month. - Dr Leanord Hawking Tuesday (front office to book appt) Anesthetic (In clinic) Topical Lidocaine 4% applied to wound bed Bathing/ Shower/ Hygiene May  shower with protection but do not get wound  leg was involved in that study or not we will need to review that. His past medical history includes type type 2 diabetes, chronic venous insufficiency with secondary lymphedema, intermittent A-fib, nonischemic cardiomyopathy, thrombus of the left  atrial appendage, hereditary hemochromatosis cytosis. He is now on Coumadin presumably for the atrial fibrillation. MAJOR, SANTERRE ([id number]) [id number] Page 3 of 9 ABIs in this clinic were not repeated 9/24; posterior left calf wound. We used Hydrofera Blue under compression. The wound looks better this week better looking surface. 10/1. Posterior left calf wound secondary to venous insufficiency. He was new to the clinic in 2 weeks ago at which time I thought he was going to require a difficult set of mechanical debridements however the wound surface is continuing to improve using Hydrofera Blue although the dimensions not so remarkably improved today 10/8; posterior left calf wound secondary to chronic venous insufficiency. He continues to think nice progress here on this wound the wound is smaller better looking surface. He arrived in clinic today with an abrasion on his Achilles area on the left leg as well as what looked to be a potential deep tissue injury on the lateral lateral part of the left foot at the level of the base of the fifth metatarsal. Electronic Signature(s) Signed: 03/25/2023 9:33:55 AM By: Baltazar Najjar MD Entered By: Baltazar Najjar on 03/24/2023 12:26:56 -------------------------------------------------------------------------------- Physical Exam Details Patient Name: Date of Service: [name] 03/24/2023 1:30 PM Medical Record Number: [id number] Patient Account Number: [id number] Date of Birth/Sex: Treating RN: [birth year] (67 y.o. M) Primary Care Provider: Hillard Danker Other Clinician: Referring Provider: Treating Provider/Extender: Shara Blazing in Treatment: 3 Constitutional Sitting or standing Blood Pressure is within target range for patient.. Pulse regular and within target range for patient.[name] Respirations regular, non-labored and within target range.. Temperature is normal and within  the target range for the patient.[name] Appears in no distress. Notes Wound exam; posterior left calf continues to be improved in terms of dimensions and condition of the wound. No debridement is required. UNFORTUNATELY he appears to have an abrasion injury on the left Achilles area and perhaps a deep tissue developing injury on the left lateral foot. Both of these were require padding. Electronic Signature(s) Signed: 03/25/2023 9:33:55 AM By: Baltazar Najjar MD Entered By: Baltazar Najjar on 03/24/2023 12:28:23 -------------------------------------------------------------------------------- Physician Orders Details Patient Name: Date of Service: [name] 03/24/2023 1:30 PM Medical Record Number: [id number] Patient Account Number: [id number] Date of Birth/Sex: Treating RN: September 04, 1955 (67 y.o. [name] Primary Care Provider: Hillard Danker Other Clinician: Referring Provider: Treating Provider/Extender: Shara Blazing in Treatment: 3 The following information was scribed by: Shawn Stall The information was scribed for: Baltazar Najjar Verbal / Phone Orders: No Diagnosis Coding ICD-10 Coding Code Description L97.828 Non-pressure chronic ulcer of other part of left lower leg with other specified severity I87.332 Chronic venous hypertension (idiopathic) with ulcer and inflammation of left lower extremity E11.622 Type 2 diabetes mellitus with other skin ulcer CHON, BUHL ([id number]) [id number].pdf Page 4 of 9 Follow-up Appointments ppointment in 1 week. - Dr Leanord Hawking Tuesday 03/31/2023 145pm Return A ppointment in 2 weeks. - Dr Leanord Hawking Tuesday 04/07/2023 115pm Return A Return appointment in 3 weeks. - Dr Leanord Hawking Tuesday (already scheduled) Return appointment in 1 month. - Dr Leanord Hawking Tuesday (front office to book appt) Anesthetic (In clinic) Topical Lidocaine 4% applied to wound bed Bathing/ Shower/ Hygiene May  shower with protection but do not get wound  SAMWISE, ECKARDT ([id number]) [id number] Page 1 of 9 Visit Report for 03/24/2023 HPI Details Patient Name: Date of Service: KYEN, TAITE 03/24/2023 1:30 PM Medical Record Number: [id number] Patient Account Number: [id number] Date of Birth/Sex: Treating RN: October 02, 1955 (67 y.o. M) Primary Care Provider: Hillard Danker Other Clinician: Referring Provider: Treating Provider/Extender: Shara Blazing in Treatment: 3 History of Present Illness HPI Description: ADMISSION 07/29/2018 Mr. Goede is a 67 year old man with either prediabetes or diabetes he is on glipizide. In late October to November 2019 he noted a scabbed area on the back of his right calf. He picked this off a few times but it would not heal. He saw his primary physician on 12/5. It was felt at that time that this may actually heal on its own with conservative management. The next visit was on 07/20/2018 noted the area was a lot worse and arranged for his treatment here. He has been topical antibiotics like Neosporin although he stopped using this when the wound looked worse. He is just been covering this with a clean Band-Aid. He is given Bactrim a week ago and he is finishing this currently. It is made some improvement in the surrounding erythema per the patient. He does not have a history of nonhealing wounds. No prior history of wounds on his legs that he had difficulty healing. No prior skin issues. 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 Past medical history; A. fib status post ablation and recent cardioversion he is on Xarelto. He has a history of systolic heart failure, cervical radiculopathy, cardiomyopathy and type 2 diabetes as discussed ABI in the right leg was noncompressible on the right 2/20; the biopsy I did on the patient last week was negative for malignancy. Culture grew Pseudomonas and methicillin sensitive staph aureus. He is on cefdinir  300 twice a day. I will have to make this a 10 day course. He put him in compression. He states that the redness pain and erythema are a lot better. I suspect the patient has chronic venous insufficiency probably with a secondary cellulitis 2/27; arrives today with copious amounts of drainage coming out of the wound irritating the skin below the wound area. He only renewed the final 3 days of cefdinir today 3/5; came in for a nurse change 3 days ago. Again a lot of drainage noted. Zinc oxide was applied unfortunately the drainage appears to a pool then he has a string of superficial open areas extending down into the Achilles area and some just below the wound. The wound itself does not look too bad. He is completed the antibiotics although apparently there was separation from the original 7 with the last 3 days. Culture grew a few MSSA and a few Pseudomonas 3/12; not too much difference over the last week. He came in for a dressing change on Monday by our nursing staff. Necrotic debris again over the wound surface. The entire area looks irritated but nontender and I do not think shows obvious evidence of infection. We have not heard anything about the reflux studies. Also notable than in this diabetic man we had noncompressible vessels and although I can feel his pulses easily in his feet I will order arterial studies as well. 3/19-Patient had experience more pain has been dressed with a silver alginate, the wounds all have necrotic debris, very friable with easy bleeding with any kind of surface debridement including with gauze and Anasept and with a #3 curette. His vascular  Per Week/15 Days Discharge Instructions: Apply moisturizing lotion as directed Prim Dressing: Promogran Prisma Matrix, 4.34 (sq in) (silver collagen) 1 x Per Week/15 Days ary Discharge Instructions: Moisten collagen with hydrogel Secondary Dressing: ABD Pad, 8x10 1 x Per Week/15 Days Discharge Instructions: Apply over primary dressing as directed. Secondary Dressing: Optifoam Non-Adhesive Dressing, 4x4 in 1 x Per Week/15 Days Discharge Instructions: Apply to ankle, achilles, lateral part of foot for protection Com pression Wrap: Urgo K2, (equivalent to a 4 layer) two layer compression system, regular 1 x Per Week/15 Days Discharge Instructions: Apply Urgo K2 as directed (alternative to 4 layer compression). 1. Hydrofera Blue stuck to the wound bed we changed to Prisma hydrogel ABDs. 2. Padding to the abrasions on the Achilles and the lateral foot. #3 Urgo K2 compression Electronic Signature(s) Signed: 03/25/2023 9:33:55 AM By: Baltazar Najjar MD Entered By: Baltazar Najjar on 03/24/2023 12:30:44 -------------------------------------------------------------------------------- SuperBill Details Patient Name: Date of Service: [name] 03/24/2023 Medical Record Number: [id number] Patient Account Number: [id number] Date of Birth/Sex: Treating RN: February 23, 1956 (67 y.o. [name] Primary Care Provider: Hillard Danker Other Clinician: Referring Provider: Treating Provider/Extender: Shara Blazing in Treatment: 3 Diagnosis Coding ICD-10 Codes Code Description [phone] Non-pressure chronic ulcer of other part of left lower leg with other specified severity I87.332 Chronic venous hypertension (idiopathic) with ulcer and inflammation of left lower extremity E11.622 Type 2 diabetes mellitus with other skin ulcer Facility Procedures : CPT4 Code:  [id number] ( Description: Facility Use Only) [phone] - APPLY MULTLAY COMPRS LWR LT LEG Modifier: Quantity: 1 Physician Procedures : CPT4 Code Description [address] DEIONTE, SPIVACK ([id number]) [id number] - WC PHYS LEVEL 3 - EST PT 1 ICD-10 Diagnosis Description L97.828 Non-pressure chronic ulcer of other part of left lower leg with other  specified severity I87.332 Chronic venous hypertension (idiopathic) with ulcer and inflammation of left lower extremity E11.622 Type 2 diabetes mellitus with other skin ulcer Quantity: f Page 9 of 9 Electronic Signature(s) Signed: 03/25/2023 9:33:55 AM By: Baltazar Najjar MD Entered By: Baltazar Najjar on 03/24/2023 12:31:07  SAMWISE, ECKARDT ([id number]) [id number] Page 1 of 9 Visit Report for 03/24/2023 HPI Details Patient Name: Date of Service: KYEN, TAITE 03/24/2023 1:30 PM Medical Record Number: [id number] Patient Account Number: [id number] Date of Birth/Sex: Treating RN: October 02, 1955 (67 y.o. M) Primary Care Provider: Hillard Danker Other Clinician: Referring Provider: Treating Provider/Extender: Shara Blazing in Treatment: 3 History of Present Illness HPI Description: ADMISSION 07/29/2018 Mr. Goede is a 67 year old man with either prediabetes or diabetes he is on glipizide. In late October to November 2019 he noted a scabbed area on the back of his right calf. He picked this off a few times but it would not heal. He saw his primary physician on 12/5. It was felt at that time that this may actually heal on its own with conservative management. The next visit was on 07/20/2018 noted the area was a lot worse and arranged for his treatment here. He has been topical antibiotics like Neosporin although he stopped using this when the wound looked worse. He is just been covering this with a clean Band-Aid. He is given Bactrim a week ago and he is finishing this currently. It is made some improvement in the surrounding erythema per the patient. He does not have a history of nonhealing wounds. No prior history of wounds on his legs that he had difficulty healing. No prior skin issues. 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 Past medical history; A. fib status post ablation and recent cardioversion he is on Xarelto. He has a history of systolic heart failure, cervical radiculopathy, cardiomyopathy and type 2 diabetes as discussed ABI in the right leg was noncompressible on the right 2/20; the biopsy I did on the patient last week was negative for malignancy. Culture grew Pseudomonas and methicillin sensitive staph aureus. He is on cefdinir  300 twice a day. I will have to make this a 10 day course. He put him in compression. He states that the redness pain and erythema are a lot better. I suspect the patient has chronic venous insufficiency probably with a secondary cellulitis 2/27; arrives today with copious amounts of drainage coming out of the wound irritating the skin below the wound area. He only renewed the final 3 days of cefdinir today 3/5; came in for a nurse change 3 days ago. Again a lot of drainage noted. Zinc oxide was applied unfortunately the drainage appears to a pool then he has a string of superficial open areas extending down into the Achilles area and some just below the wound. The wound itself does not look too bad. He is completed the antibiotics although apparently there was separation from the original 7 with the last 3 days. Culture grew a few MSSA and a few Pseudomonas 3/12; not too much difference over the last week. He came in for a dressing change on Monday by our nursing staff. Necrotic debris again over the wound surface. The entire area looks irritated but nontender and I do not think shows obvious evidence of infection. We have not heard anything about the reflux studies. Also notable than in this diabetic man we had noncompressible vessels and although I can feel his pulses easily in his feet I will order arterial studies as well. 3/19-Patient had experience more pain has been dressed with a silver alginate, the wounds all have necrotic debris, very friable with easy bleeding with any kind of surface debridement including with gauze and Anasept and with a #3 curette. His vascular

## 2023-03-25 NOTE — Progress Notes (Signed)
GASPER, HOPES ([id number]) [id number] Page 1 of 8 Visit Report for 03/24/2023 Arrival Information Details Patient Name: Date of Service: [name], [name] 03/24/2023 1:30 PM Medical Record Number: [id number] Patient Account Number: [id number] Date of Birth/Sex: Treating RN: February 19, 1956 (67 y.o. [name] Primary Care [name]: Hillard Danker Other Clinician: Referring [name]: Treating [name]/Extender: Shara Blazing in Treatment: 3 Visit Information History Since Last Visit Added or deleted any medications: No Patient Arrived: Ambulatory Any new allergies or adverse reactions: No Arrival Time: 13:45 Had a fall or experienced change in No Accompanied By: self activities of daily living that may affect Transfer Assistance: None risk of falls: Patient Identification Verified: Yes Signs or symptoms of abuse/neglect since last visito No Secondary Verification Process Completed: Yes Hospitalized since last visit: No Patient Requires Transmission-Based Precautions: No Implantable device outside of the clinic excluding No Patient Has Alerts: Yes cellular tissue based products placed in the center Patient Alerts: Patient on Blood Thinner since last visit: Has Dressing in Place as Prescribed: Yes Has Compression in Place as Prescribed: Yes Pain Present Now: No Electronic Signature(s) Signed: 03/24/2023 5:32:42 PM By: Shawn Stall RN, BSN Entered By: Shawn Stall on 03/24/2023 10:45:43 -------------------------------------------------------------------------------- Compression Therapy Details Patient Name: Date of Service: [name] Asper NDY [name]. 03/24/2023 1:30 PM Medical Record Number: [id number] Patient Account Number: [id number] Date of Birth/Sex: Treating RN: 23-Aug-1955 (67 y.o. [name] Primary Care Zoe Nordin: Hillard Danker Other Clinician: Referring [name]: Treating [name]/Extender: Shara Blazing in Treatment: 3 Compression Therapy Performed for Wound Assessment: Wound #13 Left,Posterior Lower Leg Performed By: Clinician Shawn Stall, RN Compression Type: Double Layer Post Procedure Diagnosis Same as Pre-procedure Electronic Signature(s) Signed: 03/24/2023 5:32:42 PM By: Shawn Stall RN, BSN Entered By: Shawn Stall on 03/24/2023 10:57:54 [name] ([id number]) [id number] Page 2 of 8 -------------------------------------------------------------------------------- Encounter Discharge Information Details Patient Name: Date of Service: [name], [name] 03/24/2023 1:30 PM Medical Record Number: [id number] Patient Account Number: [id number] Date of Birth/Sex: Treating RN: 1955/11/18 (67 y.o. [name] Primary Care [name]: Hillard Danker Other Clinician: Referring [name]: Treating [name]/Extender: Shara Blazing in Treatment: 3 Encounter Discharge Information Items Discharge Condition: Stable Ambulatory Status: Ambulatory Discharge Destination: Home Transportation: Private Auto Accompanied By: self Schedule Follow-up Appointment: Yes Clinical Summary of Care: Electronic Signature(s) Signed: 03/24/2023 5:32:42 PM By: Shawn Stall RN, BSN Entered By: Shawn Stall on 03/24/2023 11:00:50 -------------------------------------------------------------------------------- Lower Extremity Assessment Details Patient Name: Date of Service: [name], [name] NDY [name]. 03/24/2023 1:30 PM Medical Record Number: [id number] Patient Account Number: [id number] Date of Birth/Sex: Treating RN: December 06, 1955 (67 y.o. [name] Primary Care Sakari Raisanen: Hillard Danker Other Clinician: Referring [name]: Treating [name]/Extender: Shara Blazing in Treatment: 3 Edema Assessment Assessed: [name]: Yes] [Right: No] Edema: [Left: N] [Right: o] Calf Left:  Right: Point of Measurement: 33 cm From Medial Instep 37 cm Ankle Left: Right: Point of Measurement: 12 cm From Medial Instep 25 cm Vascular Assessment Pulses: Dorsalis Pedis Palpable: [Left:Yes] Extremity colors, hair growth, and conditions: Extremity Color: [Left:Hyperpigmented] Hair Growth on Extremity: [Left:No] Temperature of Extremity: [Left:Warm] Capillary Refill: [Left:< 3 seconds] Dependent Rubor: [Left:No] Blanched when Elevated: [Left:No Yes] Toe Nail Assessment Left: Right: Thick: Yes Discolored: Yes Deformed: Yes Improper Length and Hygiene: No [name] ([id number]) [id number] Page 3 of 8 Electronic Signature(s) Signed: 03/24/2023 5:32:42 PM By: Shawn Stall RN, BSN Entered By: Shawn Stall on 03/24/2023 10:46:16 -------------------------------------------------------------------------------- Multi Wound Chart Details Patient Name: Date  Interventions: Assess patient/caregiver ability to obtain necessary supplies Assess patient/caregiver ability to perform ulcer/skin care regimen upon admission and as needed Assess ulceration(s) every visit Provide education on ulcer and skin care Notes: Electronic Signature(s) Signed: 03/24/2023 5:32:42 PM By: Shawn Stall RN, BSN Entered By: Shawn Stall on 03/24/2023  10:48:27 -------------------------------------------------------------------------------- Pain Assessment Details Patient Name: Date of Service: [name], [name] NDY [name]. 03/24/2023 1:30 PM Medical Record Number: [id number] Patient Account Number: [id number] Date of Birth/Sex: Treating RN: [birth year] (67 y.o. [name] Primary Care [name]: Hillard Danker Other Clinician: Referring Karesa Maultsby: Treating [name]/Extender: Shara Blazing in Treatment: 3 Active Problems Location of Pain Severity and Description of Pain Patient Has Paino No Site Locations Pain Management and Medication Current Pain Management: Electronic Signature(s) Signed: 03/24/2023 5:32:42 PM By: Shawn Stall RN, BSN Entered By: Shawn Stall on 03/24/2023 10:46:00 [name] ([id number]) [id number] Page 6 of 8 -------------------------------------------------------------------------------- Patient/Caregiver Education Details Patient Name: Date of Service: [name], [name] 10/8/2024andnbsp1:30 PM Medical Record Number: [id number] Patient Account Number: [id number] Date of Birth/Gender: Treating RN: 1955/10/29 (67 y.o. [name] Primary Care Physician: Hillard Danker Other Clinician: Referring Physician: Treating Physician/Extender: Shara Blazing in Treatment: 3 Education Assessment Education Provided To: Patient Education Topics Provided Wound/Skin Impairment: Handouts: Caring for Your Ulcer Methods: Explain/Verbal Responses: Reinforcements needed Electronic Signature(s) Signed: 03/24/2023 5:32:42 PM By: Shawn Stall RN, BSN Entered By: Shawn Stall on 03/24/2023 10:48:55 -------------------------------------------------------------------------------- Wound Assessment Details Patient Name: Date of Service: [name], [name] NDY [name]. 03/24/2023 1:30 PM Medical Record Number: [id number] Patient Account Number:  [id number] Date of Birth/Sex: Treating RN: [birth year] (67 y.o. [name], [name] Primary Care Kaydence Menard: Hillard Danker Other Clinician: Referring Dylann Gallier: Treating Asiel Chrostowski/Extender: Shara Blazing in Treatment: 3 Wound Status Wound Number: 13 Primary Venous Leg Ulcer Etiology: Wound Location: Left, Posterior Lower Leg Secondary Diabetic Wound/Ulcer of the Lower Extremity Wounding Event: Blister Etiology: Date Acquired: 01/20/2023 Wound Open Weeks Of Treatment: 3 Status: Clustered Wound: No Comorbid Asthma, Sleep Apnea, Arrhythmia, Congestive Heart Failure, History: Hypertension, Type II Diabetes Photos Wound Measurements [name], [name] ([id number]) Length: (cm) 4.4 Width: (cm) 3 Depth: (cm) 0.1 Area: (cm) 10.367 Volume: (cm) 1.037 [id number] Page 7 of 8 % Reduction in Area: 46.7% % Reduction in Volume: 73.3% Epithelialization: Medium (34-66%) Tunneling: No Undermining: No Wound Description Classification: Full Thickness Without Exposed Support Structures Wound Margin: Distinct, outline attached Exudate Amount: Medium Exudate Type: Serosanguineous Exudate Color: red, brown Foul Odor After Cleansing: No Slough/Fibrino No Wound Bed Granulation Amount: Large (67-100%) Exposed Structure Granulation Quality: Red, Pink, Friable Fascia Exposed: No Necrotic Amount: None Present (0%) Fat Layer (Subcutaneous Tissue) Exposed: Yes Tendon Exposed: No Muscle Exposed: No Joint Exposed: No Bone Exposed: No Periwound Skin Texture Texture Color No Abnormalities Noted: No No Abnormalities Noted: No Callus: No Atrophie Blanche: No Crepitus: No Cyanosis: No Excoriation: No Ecchymosis: No Induration: No Erythema: No Rash: No Hemosiderin Staining: Yes Scarring: No Mottled: No Pallor: No Moisture Rubor: No No Abnormalities Noted: No Dry / Scaly: No Maceration: No Treatment Notes Wound #13 (Lower Leg) Wound  Laterality: Left, Posterior Cleanser Soap and Water Discharge Instruction: May shower and wash wound with dial antibacterial soap and water prior to dressing change. Vashe 5.8 (oz) Discharge Instruction: Cleanse the wound with Vashe prior to applying a clean dressing using gauze sponges, not tissue or cotton balls. Peri-Wound Care Sween Lotion (Moisturizing lotion) Discharge Instruction: Apply moisturizing lotion as directed Topical Primary Dressing Promogran Prisma Matrix, 4.34 (  Interventions: Assess patient/caregiver ability to obtain necessary supplies Assess patient/caregiver ability to perform ulcer/skin care regimen upon admission and as needed Assess ulceration(s) every visit Provide education on ulcer and skin care Notes: Electronic Signature(s) Signed: 03/24/2023 5:32:42 PM By: Shawn Stall RN, BSN Entered By: Shawn Stall on 03/24/2023  10:48:27 -------------------------------------------------------------------------------- Pain Assessment Details Patient Name: Date of Service: [name], [name] NDY [name]. 03/24/2023 1:30 PM Medical Record Number: [id number] Patient Account Number: [id number] Date of Birth/Sex: Treating RN: [birth year] (67 y.o. [name] Primary Care [name]: Hillard Danker Other Clinician: Referring Karesa Maultsby: Treating [name]/Extender: Shara Blazing in Treatment: 3 Active Problems Location of Pain Severity and Description of Pain Patient Has Paino No Site Locations Pain Management and Medication Current Pain Management: Electronic Signature(s) Signed: 03/24/2023 5:32:42 PM By: Shawn Stall RN, BSN Entered By: Shawn Stall on 03/24/2023 10:46:00 [name] ([id number]) [id number] Page 6 of 8 -------------------------------------------------------------------------------- Patient/Caregiver Education Details Patient Name: Date of Service: [name], [name] 10/8/2024andnbsp1:30 PM Medical Record Number: [id number] Patient Account Number: [id number] Date of Birth/Gender: Treating RN: 1955/10/29 (67 y.o. [name] Primary Care Physician: Hillard Danker Other Clinician: Referring Physician: Treating Physician/Extender: Shara Blazing in Treatment: 3 Education Assessment Education Provided To: Patient Education Topics Provided Wound/Skin Impairment: Handouts: Caring for Your Ulcer Methods: Explain/Verbal Responses: Reinforcements needed Electronic Signature(s) Signed: 03/24/2023 5:32:42 PM By: Shawn Stall RN, BSN Entered By: Shawn Stall on 03/24/2023 10:48:55 -------------------------------------------------------------------------------- Wound Assessment Details Patient Name: Date of Service: [name], [name] NDY [name]. 03/24/2023 1:30 PM Medical Record Number: [id number] Patient Account Number:  [id number] Date of Birth/Sex: Treating RN: [birth year] (67 y.o. [name], [name] Primary Care Kaydence Menard: Hillard Danker Other Clinician: Referring Dylann Gallier: Treating Asiel Chrostowski/Extender: Shara Blazing in Treatment: 3 Wound Status Wound Number: 13 Primary Venous Leg Ulcer Etiology: Wound Location: Left, Posterior Lower Leg Secondary Diabetic Wound/Ulcer of the Lower Extremity Wounding Event: Blister Etiology: Date Acquired: 01/20/2023 Wound Open Weeks Of Treatment: 3 Status: Clustered Wound: No Comorbid Asthma, Sleep Apnea, Arrhythmia, Congestive Heart Failure, History: Hypertension, Type II Diabetes Photos Wound Measurements [name], [name] ([id number]) Length: (cm) 4.4 Width: (cm) 3 Depth: (cm) 0.1 Area: (cm) 10.367 Volume: (cm) 1.037 [id number] Page 7 of 8 % Reduction in Area: 46.7% % Reduction in Volume: 73.3% Epithelialization: Medium (34-66%) Tunneling: No Undermining: No Wound Description Classification: Full Thickness Without Exposed Support Structures Wound Margin: Distinct, outline attached Exudate Amount: Medium Exudate Type: Serosanguineous Exudate Color: red, brown Foul Odor After Cleansing: No Slough/Fibrino No Wound Bed Granulation Amount: Large (67-100%) Exposed Structure Granulation Quality: Red, Pink, Friable Fascia Exposed: No Necrotic Amount: None Present (0%) Fat Layer (Subcutaneous Tissue) Exposed: Yes Tendon Exposed: No Muscle Exposed: No Joint Exposed: No Bone Exposed: No Periwound Skin Texture Texture Color No Abnormalities Noted: No No Abnormalities Noted: No Callus: No Atrophie Blanche: No Crepitus: No Cyanosis: No Excoriation: No Ecchymosis: No Induration: No Erythema: No Rash: No Hemosiderin Staining: Yes Scarring: No Mottled: No Pallor: No Moisture Rubor: No No Abnormalities Noted: No Dry / Scaly: No Maceration: No Treatment Notes Wound #13 (Lower Leg) Wound  Laterality: Left, Posterior Cleanser Soap and Water Discharge Instruction: May shower and wash wound with dial antibacterial soap and water prior to dressing change. Vashe 5.8 (oz) Discharge Instruction: Cleanse the wound with Vashe prior to applying a clean dressing using gauze sponges, not tissue or cotton balls. Peri-Wound Care Sween Lotion (Moisturizing lotion) Discharge Instruction: Apply moisturizing lotion as directed Topical Primary Dressing Promogran Prisma Matrix, 4.34 (  of Service: [name], [name] 03/24/2023 1:30 PM Medical Record Number: [id number] Patient Account Number: [id number] Date of Birth/Sex: Treating RN: 1955/10/02 (67 y.o. M) Primary Care Roxene Alviar: Hillard Danker Other Clinician: Referring Gabryel Files: Treating Yutaka Holberg/Extender: Shara Blazing in Treatment: 3 Vital Signs Height(in): 73 Capillary Blood Glucose(mg/dl): 409 Weight(lbs): 811 Pulse(bpm): 67 Body Mass Index(BMI): 33.5 Blood Pressure(mmHg): 125/84 Temperature(F): 98.3 Respiratory Rate(breaths/min): 20 [13:Photos:] [N/A:N/A] Left, Posterior Lower Leg N/A N/A Wound Location: Blister N/A N/A Wounding Event: Venous Leg Ulcer N/A N/A Primary Etiology: Diabetic Wound/Ulcer of the Lower N/A N/A Secondary Etiology: Extremity Asthma, Sleep Apnea, Arrhythmia, N/A N/A Comorbid History: Congestive Heart Failure, Hypertension, Type II Diabetes 01/20/2023 N/A N/A Date Acquired: 3 N/A N/A Weeks of Treatment: Open N/A N/A Wound Status: No N/A N/A Wound  Recurrence: 4.4x3x0.1 N/A N/A Measurements L x W x D (cm) 10.367 N/A N/A A (cm) : rea 1.037 N/A N/A Volume (cm) : 46.70% N/A N/A % Reduction in Area: 73.30% N/A N/A % Reduction in Volume: Full Thickness Without Exposed N/A N/A Classification: Support Structures Medium N/A N/A Exudate Amount: Serosanguineous N/A N/A Exudate Type: red, brown N/A N/A Exudate Color: Distinct, outline attached N/A N/A Wound Margin: Large (67-100%) N/A N/A Granulation Amount: Red, Pink, Friable N/A N/A Granulation Quality: None Present (0%) N/A N/A Necrotic Amount: Fat Layer (Subcutaneous Tissue): Yes N/A N/A Exposed Structures: Fascia: No Tendon: No Muscle: No Joint: No Bone: No Medium (34-66%) N/A N/A Epithelialization: Excoriation: No N/A N/A Periwound Skin Texture: Induration: No Callus: No Crepitus: No Rash: No Scarring: No DELON, REVELO ([id number]) [phone].pdf Page 4 of 8 Maceration: No N/A N/A Periwound Skin Moisture: Dry/Scaly: No Hemosiderin Staining: Yes N/A N/A Periwound Skin Color: Atrophie Blanche: No Cyanosis: No Ecchymosis: No Erythema: No Mottled: No Pallor: No Rubor: No Compression Therapy N/A N/A Procedures Performed: Treatment Notes Wound #13 (Lower Leg) Wound Laterality: Left, Posterior Cleanser Soap and Water Discharge Instruction: May shower and wash wound with dial antibacterial soap and water prior to dressing change. Vashe 5.8 (oz) Discharge Instruction: Cleanse the wound with Vashe prior to applying a clean dressing using gauze sponges, not tissue or cotton balls. Peri-Wound Care Sween Lotion (Moisturizing lotion) Discharge Instruction: Apply moisturizing lotion as directed Topical Primary Dressing Promogran Prisma Matrix, 4.34 (sq in) (silver collagen) Discharge Instruction: Moisten collagen with hydrogel Secondary Dressing ABD Pad, 8x10 Discharge Instruction: Apply over primary dressing as  directed. Optifoam Non-Adhesive Dressing, 4x4 in Discharge Instruction: Apply to ankle, achilles, lateral part of foot for protection Secured With Compression Wrap Urgo K2, (equivalent to a 4 layer) two layer compression system, regular Discharge Instruction: Apply Urgo K2 as directed (alternative to 4 layer compression). Compression Stockings Add-Ons Electronic Signature(s) Signed: 03/25/2023 9:33:55 AM By: Baltazar Najjar MD Entered By: Baltazar Najjar on 03/24/2023 12:25:46 -------------------------------------------------------------------------------- Multi-Disciplinary Care Plan Details Patient Name: Date of Service: [name] Asper NDY [name]. 03/24/2023 1:30 PM Medical Record Number: [id number] Patient Account Number: [id number] Date of Birth/Sex: Treating RN: 03/08/1956 (67 y.o. [name] Primary Care Alizaya Oshea: Hillard Danker Other Clinician: Referring Ameerah Huffstetler: Treating Gelila Well/Extender: Shara Blazing in Treatment: 3 Active Inactive Wound/Skin Impairment Murphy, Dalene Seltzer ([id number]) [id number] Page 5 of 8 Nursing Diagnoses: Impaired tissue integrity Knowledge deficit related to ulceration/compromised skin integrity Goals: Patient/caregiver will verbalize understanding of skin care regimen Date Initiated: 03/03/2023 Target Resolution Date: 05/15/2023 Goal Status: Active Ulcer/skin breakdown will have a volume reduction of 30% by week 4 Date Initiated: 03/03/2023 Target Resolution Date: 03/31/2023 Goal Status: Active

## 2023-03-28 ENCOUNTER — Other Ambulatory Visit: Payer: Self-pay | Admitting: Internal Medicine

## 2023-03-30 ENCOUNTER — Other Ambulatory Visit: Payer: Self-pay | Admitting: Internal Medicine

## 2023-03-30 DIAGNOSIS — E1169 Type 2 diabetes mellitus with other specified complication: Secondary | ICD-10-CM

## 2023-03-31 ENCOUNTER — Encounter (HOSPITAL_BASED_OUTPATIENT_CLINIC_OR_DEPARTMENT_OTHER): Payer: Medicare HMO | Admitting: Internal Medicine

## 2023-03-31 DIAGNOSIS — I87332 Chronic venous hypertension (idiopathic) with ulcer and inflammation of left lower extremity: Secondary | ICD-10-CM | POA: Diagnosis not present

## 2023-03-31 DIAGNOSIS — I872 Venous insufficiency (chronic) (peripheral): Secondary | ICD-10-CM | POA: Diagnosis not present

## 2023-03-31 DIAGNOSIS — L97222 Non-pressure chronic ulcer of left calf with fat layer exposed: Secondary | ICD-10-CM | POA: Diagnosis not present

## 2023-03-31 DIAGNOSIS — I428 Other cardiomyopathies: Secondary | ICD-10-CM | POA: Diagnosis not present

## 2023-03-31 DIAGNOSIS — E11622 Type 2 diabetes mellitus with other skin ulcer: Secondary | ICD-10-CM | POA: Diagnosis not present

## 2023-03-31 DIAGNOSIS — L97328 Non-pressure chronic ulcer of left ankle with other specified severity: Secondary | ICD-10-CM | POA: Diagnosis not present

## 2023-03-31 DIAGNOSIS — I480 Paroxysmal atrial fibrillation: Secondary | ICD-10-CM | POA: Diagnosis not present

## 2023-03-31 DIAGNOSIS — L97828 Non-pressure chronic ulcer of other part of left lower leg with other specified severity: Secondary | ICD-10-CM | POA: Diagnosis not present

## 2023-03-31 DIAGNOSIS — I110 Hypertensive heart disease with heart failure: Secondary | ICD-10-CM | POA: Diagnosis not present

## 2023-03-31 DIAGNOSIS — E875 Hyperkalemia: Secondary | ICD-10-CM | POA: Diagnosis not present

## 2023-04-01 NOTE — Progress Notes (Signed)
Medical Record Number: [id number] Patient Account Number: [id number] Date of Birth/Sex: Treating RN: 10/25/1955 (68 y.o. M) Primary Care Kewanna Kasprzak: Hillard Danker Other Clinician: Referring Ravinder Hofland: Treating Jerolyn Flenniken/Extender: Shara Blazing in Treatment: 4 Vital Signs Height(in): 73 Capillary Blood Glucose(mg/dl): 409 Weight(lbs): 811 Pulse(bpm): 73 Body Mass Index(BMI): 33.5 Blood Pressure(mmHg): 138/89 Temperature(F): 98.3 Respiratory Rate(breaths/min): 18 [13:Photos:] [N/A:N/A] Left, Posterior Lower Leg N/A N/A Wound Location: Blister N/A N/A Wounding Event: Venous Leg Ulcer N/A N/A Primary Etiology: Diabetic Wound/Ulcer of the Lower N/A N/A Secondary Etiology: Extremity Asthma, Sleep Apnea, Arrhythmia, N/A N/A Comorbid History: Congestive Heart Failure, Hypertension, Type II Diabetes 01/20/2023 N/A N/A Date Acquired: 4 N/A N/A Weeks of Treatment: Open N/A N/A Wound Status: No N/A N/A Wound  Recurrence: 3.5x2.7x0.1 N/A N/A Measurements L x W x D (cm) 7.422 N/A N/A A (cm) : rea 0.742 N/A N/A Volume (cm) : 61.80% N/A N/A % Reduction in Area: 80.90% N/A N/A % Reduction in Volume: Full Thickness Without Exposed N/A N/A Classification: Support Structures Medium N/A N/A Exudate Amount: Serosanguineous N/A N/A Exudate Type: red, brown N/A N/A Exudate Color: Distinct, outline attached N/A N/A Wound Margin: Large (67-100%) N/A N/A Granulation Amount: Red, Pink, Friable N/A N/A Granulation Quality: None Present (0%) N/A N/A Necrotic Amount: Fat Layer (Subcutaneous Tissue): Yes N/A N/A Exposed Structures: Fascia: No Tendon: No Muscle: No Joint: No Bone: No Medium (34-66%) N/A N/A Epithelialization: Excoriation: No N/A N/A Periwound Skin Texture: Induration: No Callus: No Crepitus: No Rash: No Scarring: No Maceration: No N/A N/A Periwound Skin Moisture: Dry/Scaly: No [name], [name] ([id number]) [id number] Page 4 of 7 Hemosiderin Staining: Yes N/A N/A Periwound Skin Color: Atrophie Blanche: No Cyanosis: No Ecchymosis: No Erythema: No Mottled: No Pallor: No Rubor: No Treatment Notes Electronic Signature(s) Signed: 03/31/2023 4:40:46 PM By: Baltazar Najjar MD Entered By: Baltazar Najjar on 03/31/2023 14:06:40 -------------------------------------------------------------------------------- Multi-Disciplinary Care Plan Details Patient Name: Date of Service: [name] Coco. 03/31/2023 1:45 PM Medical Record Number: [id number] Patient Account Number: [id number] Date of Birth/Sex: Treating RN: 30-Apr-1956 (67 y.o. Yates Decamp Primary Care Alban Marucci: Hillard Danker Other Clinician: Referring Elyssia Strausser: Treating Quintana Canelo/Extender: Shara Blazing in Treatment: 4 Active Inactive Wound/Skin Impairment Nursing Diagnoses: Impaired tissue integrity Knowledge deficit related to  ulceration/compromised skin integrity Goals: Patient/caregiver will verbalize understanding of skin care regimen Date Initiated: 03/03/2023 Target Resolution Date: 05/15/2023 Goal Status: Active Ulcer/skin breakdown will have a volume reduction of 30% by week 4 Date Initiated: 03/03/2023 Target Resolution Date: 03/31/2023 Goal Status: Active Interventions: Assess patient/caregiver ability to obtain necessary supplies Assess patient/caregiver ability to perform ulcer/skin care regimen upon admission and as needed Assess ulceration(s) every visit Provide education on ulcer and skin care Notes: Electronic Signature(s) Signed: 04/01/2023 4:07:14 PM By: Brenton Grills Entered By: Brenton Grills on 03/31/2023 13:56:32 -------------------------------------------------------------------------------- Pain Assessment Details Patient Name: Date of Service: [name], [name] 03/31/2023 1:45 PM Medical Record Number: [id number] Patient Account Number: [id number] Date of Birth/Sex: Treating RN: February 06, 1956 (67 y.o. Jidenna, Figgs, Dalene Seltzer ([id number]) [id number] Page 5 of 7 Primary Care Ertha Nabor: Hillard Danker Other Clinician: Referring Corrin Sieling: Treating Earlena Werst/Extender: Shara Blazing in Treatment: 4 Active Problems Location of Pain Severity and Description of Pain Patient Has Paino Yes Site Locations Rate the pain. Current Pain Level: 5 Pain Management and Medication Current Pain Management: Electronic Signature(s) Signed: 04/01/2023 4:07:14 PM By: Brenton Grills Entered By: Brenton Grills on 03/31/2023 13:51:05 -------------------------------------------------------------------------------- Patient/Caregiver Education Details Patient Name: Date of Service: [name],  Medical Record Number: [id number] Patient Account Number: [id number] Date of Birth/Sex: Treating RN: 10/25/1955 (68 y.o. M) Primary Care Kewanna Kasprzak: Hillard Danker Other Clinician: Referring Ravinder Hofland: Treating Jerolyn Flenniken/Extender: Shara Blazing in Treatment: 4 Vital Signs Height(in): 73 Capillary Blood Glucose(mg/dl): 409 Weight(lbs): 811 Pulse(bpm): 73 Body Mass Index(BMI): 33.5 Blood Pressure(mmHg): 138/89 Temperature(F): 98.3 Respiratory Rate(breaths/min): 18 [13:Photos:] [N/A:N/A] Left, Posterior Lower Leg N/A N/A Wound Location: Blister N/A N/A Wounding Event: Venous Leg Ulcer N/A N/A Primary Etiology: Diabetic Wound/Ulcer of the Lower N/A N/A Secondary Etiology: Extremity Asthma, Sleep Apnea, Arrhythmia, N/A N/A Comorbid History: Congestive Heart Failure, Hypertension, Type II Diabetes 01/20/2023 N/A N/A Date Acquired: 4 N/A N/A Weeks of Treatment: Open N/A N/A Wound Status: No N/A N/A Wound  Recurrence: 3.5x2.7x0.1 N/A N/A Measurements L x W x D (cm) 7.422 N/A N/A A (cm) : rea 0.742 N/A N/A Volume (cm) : 61.80% N/A N/A % Reduction in Area: 80.90% N/A N/A % Reduction in Volume: Full Thickness Without Exposed N/A N/A Classification: Support Structures Medium N/A N/A Exudate Amount: Serosanguineous N/A N/A Exudate Type: red, brown N/A N/A Exudate Color: Distinct, outline attached N/A N/A Wound Margin: Large (67-100%) N/A N/A Granulation Amount: Red, Pink, Friable N/A N/A Granulation Quality: None Present (0%) N/A N/A Necrotic Amount: Fat Layer (Subcutaneous Tissue): Yes N/A N/A Exposed Structures: Fascia: No Tendon: No Muscle: No Joint: No Bone: No Medium (34-66%) N/A N/A Epithelialization: Excoriation: No N/A N/A Periwound Skin Texture: Induration: No Callus: No Crepitus: No Rash: No Scarring: No Maceration: No N/A N/A Periwound Skin Moisture: Dry/Scaly: No [name], [name] ([id number]) [id number] Page 4 of 7 Hemosiderin Staining: Yes N/A N/A Periwound Skin Color: Atrophie Blanche: No Cyanosis: No Ecchymosis: No Erythema: No Mottled: No Pallor: No Rubor: No Treatment Notes Electronic Signature(s) Signed: 03/31/2023 4:40:46 PM By: Baltazar Najjar MD Entered By: Baltazar Najjar on 03/31/2023 14:06:40 -------------------------------------------------------------------------------- Multi-Disciplinary Care Plan Details Patient Name: Date of Service: [name] Coco. 03/31/2023 1:45 PM Medical Record Number: [id number] Patient Account Number: [id number] Date of Birth/Sex: Treating RN: 30-Apr-1956 (67 y.o. Yates Decamp Primary Care Alban Marucci: Hillard Danker Other Clinician: Referring Elyssia Strausser: Treating Quintana Canelo/Extender: Shara Blazing in Treatment: 4 Active Inactive Wound/Skin Impairment Nursing Diagnoses: Impaired tissue integrity Knowledge deficit related to  ulceration/compromised skin integrity Goals: Patient/caregiver will verbalize understanding of skin care regimen Date Initiated: 03/03/2023 Target Resolution Date: 05/15/2023 Goal Status: Active Ulcer/skin breakdown will have a volume reduction of 30% by week 4 Date Initiated: 03/03/2023 Target Resolution Date: 03/31/2023 Goal Status: Active Interventions: Assess patient/caregiver ability to obtain necessary supplies Assess patient/caregiver ability to perform ulcer/skin care regimen upon admission and as needed Assess ulceration(s) every visit Provide education on ulcer and skin care Notes: Electronic Signature(s) Signed: 04/01/2023 4:07:14 PM By: Brenton Grills Entered By: Brenton Grills on 03/31/2023 13:56:32 -------------------------------------------------------------------------------- Pain Assessment Details Patient Name: Date of Service: [name], [name] 03/31/2023 1:45 PM Medical Record Number: [id number] Patient Account Number: [id number] Date of Birth/Sex: Treating RN: February 06, 1956 (67 y.o. Jidenna, Figgs, Dalene Seltzer ([id number]) [id number] Page 5 of 7 Primary Care Ertha Nabor: Hillard Danker Other Clinician: Referring Corrin Sieling: Treating Earlena Werst/Extender: Shara Blazing in Treatment: 4 Active Problems Location of Pain Severity and Description of Pain Patient Has Paino Yes Site Locations Rate the pain. Current Pain Level: 5 Pain Management and Medication Current Pain Management: Electronic Signature(s) Signed: 04/01/2023 4:07:14 PM By: Brenton Grills Entered By: Brenton Grills on 03/31/2023 13:51:05 -------------------------------------------------------------------------------- Patient/Caregiver Education Details Patient Name: Date of Service: [name],  TEO, MOEDE ([id number]) [id number] Page 1 of 7 Visit Report for 03/31/2023 Arrival Information Details Patient Name: Date of Service: [name], [name] 03/31/2023 1:45 PM Medical Record Number: [id number] Patient Account Number: [id number] Date of Birth/Sex: Treating RN: 11/19/1955 (67 y.o. Yates Decamp Primary Care Sarafina Puthoff: Hillard Danker Other Clinician: Referring Rikita Grabert: Treating Tadd Holtmeyer/Extender: Shara Blazing in Treatment: 4 Visit Information History Since Last Visit All ordered tests and consults were completed: Yes Patient Arrived: Ambulatory Added or deleted any medications: No Arrival Time: 13:41 Any new allergies or adverse reactions: No Accompanied By: self Had a fall or experienced change in No Transfer Assistance: None activities of daily living that may affect Patient Identification Verified: Yes risk of falls: Secondary Verification Process Completed: Yes Signs or symptoms of abuse/neglect since last visito No Patient Requires Transmission-Based Precautions: No Hospitalized since last visit: No Patient Has Alerts: Yes Implantable device outside of the clinic excluding No Patient Alerts: Patient on Blood Thinner cellular tissue based products placed in the center since last visit: Has Dressing in Place as Prescribed: Yes Pain Present Now: No Electronic Signature(s) Signed: 04/01/2023 4:07:14 PM By: Brenton Grills Entered By: Brenton Grills on 03/31/2023 13:42:00 -------------------------------------------------------------------------------- Compression Therapy Details Patient Name: Date of Service: [name] Coco. 03/31/2023 1:45 PM Medical Record Number: [id number] Patient Account Number: [id number] Date of Birth/Sex: Treating RN: June 19, 1955 (67 y.o. Yates Decamp Primary Care Abrey Bradway: Hillard Danker Other Clinician: Referring Lucie Friedlander: Treating Raziel Koenigs/Extender:  Shara Blazing in Treatment: 4 Compression Therapy Performed for Wound Assessment: Wound #13 Left,Posterior Lower Leg Performed By: Clinician Brenton Grills, RN Compression Type: Four Layer Post Procedure Diagnosis Same as Pre-procedure Electronic Signature(s) Signed: 04/01/2023 4:07:14 PM By: Brenton Grills Entered By: Brenton Grills on 03/31/2023 14:21:11 [name] ([id number]) [id number] Page 2 of 7 -------------------------------------------------------------------------------- Encounter Discharge Information Details Patient Name: Date of Service: [name], [name] 03/31/2023 1:45 PM Medical Record Number: [id number] Patient Account Number: [id number] Date of Birth/Sex: Treating RN: 1956/05/17 (67 y.o. Yates Decamp Primary Care Keigan Tafoya: Hillard Danker Other Clinician: Referring Torence Palmeri: Treating Jolita Haefner/Extender: Shara Blazing in Treatment: 4 Encounter Discharge Information Items Discharge Condition: Stable Ambulatory Status: Ambulatory Discharge Destination: Home Transportation: Private Auto Accompanied By: self Schedule Follow-up Appointment: Yes Clinical Summary of Care: Patient Declined Electronic Signature(s) Signed: 04/01/2023 4:07:14 PM By: Brenton Grills Entered By: Brenton Grills on 03/31/2023 14:22:12 -------------------------------------------------------------------------------- Lower Extremity Assessment Details Patient Name: Date of Service: [name], [name] 03/31/2023 1:45 PM Medical Record Number: [id number] Patient Account Number: [id number] Date of Birth/Sex: Treating RN: 12/17/1955 (67 y.o. Yates Decamp Primary Care Marsh Heckler: Hillard Danker Other Clinician: Referring Kippy Gohman: Treating Kodey Xue/Extender: Shara Blazing in Treatment: 4 Edema Assessment Assessed: [name]: No] [Right: No] Edema: [Left: N] [Right:  o] Calf Left: Right: Point of Measurement: 33 cm From Medial Instep 37 cm Ankle Left: Right: Point of Measurement: 12 cm From Medial Instep 25 cm Vascular Assessment Pulses: Dorsalis Pedis Palpable: [Left:Yes] Extremity colors, hair growth, and conditions: Extremity Color: [Left:Hyperpigmented] Hair Growth on Extremity: [Left:No] Temperature of Extremity: [Left:Warm] Capillary Refill: [Left:< 3 seconds] Dependent Rubor: [Left:No Yes] Toe Nail Assessment Left: Right: Thick: Yes Discolored: Yes Deformed: Yes Improper Length and HygieneOLEG, [name] ([id number]) [id number] Page 3 of 7 Electronic Signature(s) Signed: 04/01/2023 4:07:14 PM By: Brenton Grills Entered By: Brenton Grills on 03/31/2023 13:51:24 -------------------------------------------------------------------------------- Multi Wound Chart Details Patient Name: Date of Service: [name], [name] NDY J. 03/31/2023 1:45 PM  [name] NDY J. 10/15/2024andnbsp1:45 PM Medical Record Number: [id number] Patient Account Number: [id number] Date of Birth/Gender: Treating RN: 1955-12-03  (67 y.o. Yates Decamp Primary Care Physician: Hillard Danker Other Clinician: Referring Physician: Treating Physician/Extender: Shara Blazing in Treatment: 4 Education Assessment Education Provided To: Patient Education Topics Provided Wound/Skin Impairment: Methods: Explain/Verbal Responses: State content correctly Electronic Signature(s) Signed: 04/01/2023 4:07:14 PM By: Brenton Grills Entered By: Brenton Grills on 03/31/2023 13:57:46 [name] ([id number]) [id number] Page 6 of 7 -------------------------------------------------------------------------------- Wound Assessment Details Patient Name: Date of Service: [name], [name] 03/31/2023 1:45 PM Medical Record Number: [id number] Patient Account Number: [id number] Date of Birth/Sex: Treating RN: 01-05-1956 (67 y.o. Yates Decamp Primary Care Lakara Weiland: Hillard Danker Other Clinician: Referring Oluwaseyi Raffel: Treating Allen Basista/Extender: Shara Blazing in Treatment: 4 Wound Status Wound Number: 13 Primary Venous Leg Ulcer Etiology: Wound Location: Left, Posterior Lower Leg Secondary Diabetic Wound/Ulcer of the Lower Extremity Wounding Event: Blister Etiology: Date Acquired: 01/20/2023 Wound Open Weeks Of Treatment: 4 Status: Clustered Wound: No Comorbid Asthma, Sleep Apnea, Arrhythmia, Congestive Heart Failure, History: Hypertension, Type II Diabetes Photos Wound Measurements Length: (cm) 3.5 Width: (cm) 2.7 Depth: (cm) 0.1 Area: (cm) 7.422 Volume: (cm) 0.742 % Reduction in Area: 61.8% % Reduction in Volume: 80.9% Epithelialization: Medium (34-66%) Wound Description Classification: Full Thickness Without Exposed Suppor Wound Margin: Distinct, outline attached Exudate Amount: Medium Exudate Type: Serosanguineous Exudate Color: red, brown t Structures Foul Odor After Cleansing: No Slough/Fibrino No Wound  Bed Granulation Amount: Large (67-100%) Exposed Structure Granulation Quality: Red, Pink, Friable Fascia Exposed: No Necrotic Amount: None Present (0%) Fat Layer (Subcutaneous Tissue) Exposed: Yes Tendon Exposed: No Muscle Exposed: No Joint Exposed: No Bone Exposed: No Periwound Skin Texture Texture Color No Abnormalities Noted: No No Abnormalities Noted: No Callus: No Atrophie Blanche: No Crepitus: No Cyanosis: No Excoriation: No Ecchymosis: No Induration: No Erythema: No Rash: No Hemosiderin Staining: Yes Scarring: No Mottled: No Pallor: No Moisture Rubor: No No Abnormalities Noted: No Dry / Scaly: No BRAEDEN, KENNAN ([id number]) [id number] Page 7 of 7 Maceration: No Treatment Notes Wound #13 (Lower Leg) Wound Laterality: Left, Posterior Cleanser Soap and Water Discharge Instruction: May shower and wash wound with dial antibacterial soap and water prior to dressing change. Vashe 5.8 (oz) Discharge Instruction: Cleanse the wound with Vashe prior to applying a clean dressing using gauze sponges, not tissue or cotton balls. Peri-Wound Care Sween Lotion (Moisturizing lotion) Discharge Instruction: Apply moisturizing lotion as directed Topical Primary Dressing Promogran Prisma Matrix, 4.34 (sq in) (silver collagen) Discharge Instruction: Moisten collagen with hydrogel Secondary Dressing ABD Pad, 8x10 Discharge Instruction: Apply over primary dressing as directed. Optifoam Non-Adhesive Dressing, 4x4 in Discharge Instruction: Apply to ankle, achilles, lateral part of foot for protection Secured With Compression Wrap Urgo K2, (equivalent to a 4 layer) two layer compression system, regular Discharge Instruction: Apply Urgo K2 as directed (alternative to 4 layer compression). Compression Stockings Add-Ons Electronic Signature(s) Signed: 04/01/2023 4:07:14 PM By: Brenton Grills Entered By: Brenton Grills on 03/31/2023  13:53:54 -------------------------------------------------------------------------------- Vitals Details Patient Name: Date of Service: [name] 03/31/2023 1:45 PM Medical Record Number: [id number] Patient Account Number: [id number] Date of Birth/Sex: Treating RN: [birth year] (67 y.o. Yates Decamp Primary Care Eldean Klatt: Hillard Danker Other Clinician: Referring Kaithlyn Teagle: Treating Calliope Delangel/Extender: Shara Blazing in Treatment: 4 Vital Signs Time Taken: 13:42 Temperature (F): 98.3 Height (in): 73 Pulse (bpm): 73 Weight (lbs): 254 Respiratory Rate (breaths/min): 18 Body Mass Index (BMI):

## 2023-04-06 ENCOUNTER — Ambulatory Visit: Payer: Medicare HMO | Attending: Cardiology | Admitting: *Deleted

## 2023-04-06 DIAGNOSIS — I4891 Unspecified atrial fibrillation: Secondary | ICD-10-CM | POA: Diagnosis not present

## 2023-04-06 DIAGNOSIS — Z5181 Encounter for therapeutic drug level monitoring: Secondary | ICD-10-CM

## 2023-04-06 DIAGNOSIS — I513 Intracardiac thrombosis, not elsewhere classified: Secondary | ICD-10-CM

## 2023-04-06 LAB — POCT INR: POC INR: 3.1

## 2023-04-06 NOTE — Patient Instructions (Signed)
Description   START taking Warfarin 1.5 tablets daily except for 1 tablet on Mondays, Wednesdays and Fridays. Recheck INR in 3 weeks. [facility] [phone].

## 2023-04-07 ENCOUNTER — Encounter (HOSPITAL_BASED_OUTPATIENT_CLINIC_OR_DEPARTMENT_OTHER): Payer: Medicare HMO | Admitting: Internal Medicine

## 2023-04-07 DIAGNOSIS — I428 Other cardiomyopathies: Secondary | ICD-10-CM | POA: Diagnosis not present

## 2023-04-07 DIAGNOSIS — I872 Venous insufficiency (chronic) (peripheral): Secondary | ICD-10-CM | POA: Diagnosis not present

## 2023-04-07 DIAGNOSIS — I110 Hypertensive heart disease with heart failure: Secondary | ICD-10-CM | POA: Diagnosis not present

## 2023-04-07 DIAGNOSIS — I87332 Chronic venous hypertension (idiopathic) with ulcer and inflammation of left lower extremity: Secondary | ICD-10-CM | POA: Diagnosis not present

## 2023-04-07 DIAGNOSIS — E875 Hyperkalemia: Secondary | ICD-10-CM | POA: Diagnosis not present

## 2023-04-07 DIAGNOSIS — E11622 Type 2 diabetes mellitus with other skin ulcer: Secondary | ICD-10-CM | POA: Diagnosis not present

## 2023-04-07 DIAGNOSIS — L97828 Non-pressure chronic ulcer of other part of left lower leg with other specified severity: Secondary | ICD-10-CM | POA: Diagnosis not present

## 2023-04-07 DIAGNOSIS — L97222 Non-pressure chronic ulcer of left calf with fat layer exposed: Secondary | ICD-10-CM | POA: Diagnosis not present

## 2023-04-07 DIAGNOSIS — I480 Paroxysmal atrial fibrillation: Secondary | ICD-10-CM | POA: Diagnosis not present

## 2023-04-07 DIAGNOSIS — L97328 Non-pressure chronic ulcer of left ankle with other specified severity: Secondary | ICD-10-CM | POA: Diagnosis not present

## 2023-04-07 NOTE — Progress Notes (Signed)
leg was involved in that study or not we will need to review that. His past medical history includes type type 2 diabetes, chronic venous insufficiency with secondary lymphedema, intermittent A-fib, nonischemic cardiomyopathy, thrombus of the left  atrial appendage, hereditary hemochromatosis cytosis. He is now on Coumadin presumably for the atrial fibrillation. TEDDIE, KIMBERLEY ([id number]) [id number] Page 3 of 9 ABIs in this clinic were not repeated 9/24; posterior left calf wound. We used Hydrofera Blue under compression. The wound looks better this week better looking surface. 10/1. Posterior left calf wound secondary to venous insufficiency. He was new to the clinic in 2 weeks ago at which time I thought he was going to require a difficult set of mechanical debridements however the wound surface is continuing to improve using Hydrofera Blue although the dimensions not so remarkably improved today 10/8; posterior left calf wound secondary to chronic venous insufficiency. He continues to think nice progress here on this wound the wound is smaller better looking surface. He arrived in clinic today with an abrasion on his Achilles area on the left leg as well as what looked to be a potential deep tissue injury on the lateral lateral part of the left foot at the level of the base of the fifth metatarsal. 10/15 posterior left calf wound continues to improve. We have been using Prisma under ABDs under Urgo K2 lite's. The areas on the Achilles and the left lateral foot which were concerning last time of maintain skin integrity. He is not wearing stockings even on the right leg I talked to him about this. Electronic Signature(s) Signed: 03/31/2023 4:40:46 PM By: Baltazar Najjar MD Entered By: Baltazar Najjar on 03/31/2023 14:07:53 -------------------------------------------------------------------------------- Physical Exam Details Patient Name: Date of Service: [name]. 03/31/2023 1:45 PM Medical Record Number: [id number] Patient Account Number: [id number] Date of Birth/Sex: Treating RN: 04-04-1956 (67 y.o. M) Primary Care Provider: Hillard Danker Other Clinician: Referring Provider: Treating  Provider/Extender: Shara Blazing in Treatment: 4 Constitutional Sitting or standing Blood Pressure is within target range for patient.. Pulse regular and within target range for patient.[name] Respirations regular, non-labored and within target range.. Temperature is normal and within the target range for the patient.. Notes Wound exam; left posterior calf clean granulation no depth advancing epithelialization. The abrasion injury in the Achilles is superficial I do not think this is going to open similarly on the left lateral foot on the base of the fifth metatarsal I do not believe this will be in any threat of opening. Electronic Signature(s) Signed: 03/31/2023 4:40:46 PM By: Baltazar Najjar MD Entered By: Baltazar Najjar on 03/31/2023 14:10:16 -------------------------------------------------------------------------------- Physician Orders Details Patient Name: Date of Service: [name]. 03/31/2023 1:45 PM Medical Record Number: [id number] Patient Account Number: [id number] Date of Birth/Sex: Treating RN: [birth year] (67 y.o. Yates Decamp Primary Care Provider: Hillard Danker Other Clinician: Referring Provider: Treating Provider/Extender: Shara Blazing in Treatment: 4 The following information was scribed by: Brenton Grills The information was scribed for: Baltazar Najjar Verbal / Phone Orders: No Diagnosis Coding Follow-up Appointments ppointment in 1 week. - Dr Leanord Hawking Tuesday 04/07/2023 115pm Return A ppointment in 2 weeks. - Dr Leanord Hawking Tuesday - please make appt Return A Return appointment in 3 weeks. - Dr Leanord Hawking Tuesday (already scheduled) Return appointment in 1 month. - Dr Leanord Hawking Tuesday (front office to book appt) NATHINEL, HECKMAN ([id number]) [id number].pdf Page 4 of 9 Anesthetic (In clinic) Topical Lidocaine 4% applied to wound bed Bathing/ Shower/  May shower and wash wound with dial antibacterial soap and water prior to dressing  change. Cleanser: Vashe 5.8 (oz) 1 x Per Week/15 Days Discharge Instructions: Cleanse the wound with Vashe prior to applying a clean dressing using gauze sponges, not tissue or cotton balls. Peri-Wound Care: Sween Lotion (Moisturizing lotion) 1 x Per Week/15 Days Discharge Instructions: Apply moisturizing lotion as directed Prim Dressing: Promogran Prisma Matrix, 4.34 (sq in) (silver collagen) 1 x Per Week/15 Days ary Discharge Instructions: Moisten collagen with hydrogel Secondary Dressing: ABD Pad, 8x10 1 x Per Week/15 Days Discharge Instructions: Apply over primary dressing as directed. Secondary Dressing: Optifoam Non-Adhesive Dressing, 4x4 in 1 x Per Week/15 Days Discharge Instructions: Apply to ankle, achilles, lateral part of foot for protection Com pression Wrap: Urgo K2, (equivalent to a 4 layer) two layer compression system, regular 1 x Per Week/15 Days Discharge Instructions: Apply Urgo K2 as directed (alternative to 4 layer compression). 1. I did not change the primary dressing to the worst wound on the left posterior calf. This will continue to be Prisma hydrogel ABDs under Urgo K2 lite 2. Continue to pad the abraded area on the Achilles but this did not open and continue to pad the left foot at the base of the fifth metatarsal Electronic Signature(s) Signed: 04/01/2023 3:07:10 PM By: Shawn Stall RN, BSN Signed: 04/06/2023 6:11:24 PM By: Baltazar Najjar MD Previous Signature: 03/31/2023 4:40:46 PM Version By: Baltazar Najjar MD Entered By: Shawn Stall on 04/01/2023 15:06:53 -------------------------------------------------------------------------------- SuperBill Details Patient Name: Date of Service: [name]. 03/31/2023 Medical Record Number: [id number] Patient Account Number: [id number] Date of Birth/Sex: Treating RN: 09-18-1955 (67 y.o. M) Primary Care Provider: Hillard Danker Other Clinician: Referring Provider: Treating Provider/Extender: Shara Blazing in Treatment: 4 Diagnosis Coding ICD-10 Codes Code Description [phone] Non-pressure chronic ulcer of other part of left lower leg with other specified severity I87.332 Chronic venous hypertension (idiopathic) with ulcer and inflammation of left lower extremity E11.622 Type 2 diabetes mellitus with other skin ulcer Facility Procedures : CPT4 Code: 91478295 Description: (Facility Use Only) [phone] - APPLY MULTLAY COMPRS LWR LT LEG ICD-10 Diagnosis Description L97.828 Non-pressure chronic ulcer of other part of left lower leg with other specified sever Modifier: ity Quantity: 1 Physician Procedures : CPT4 Code Description [address] TIARNAN, OSTERMEYER ([id number]) [id number] - WC PHYS LEVEL 3 - EST PT 1 ICD-10 Diagnosis Description L97.828 Non-pressure chronic ulcer of other part of left lower leg with other  specified severity I87.332 Chronic venous hypertension (idiopathic) with ulcer and inflammation of left lower extremity Quantity: f Page 9 of 9 Electronic Signature(s) Signed: 03/31/2023 4:40:46 PM By: Baltazar Najjar MD Signed: 04/01/2023 4:07:14 PM By: Brenton Grills Entered By: Brenton Grills on 03/31/2023 14:21:40  Hygiene May shower with  protection but do not get wound dressing(s) wet. Protect dressing(s) with water repellant cover (for example, large plastic bag) or a cast cover and may then take shower. Edema Control - Lymphedema / SCD / Other Left Lower Extremity Elevate legs to the level of the heart or above for 30 minutes daily and/or when sitting for 3-4 times a day throughout the day. Avoid standing for long periods of time. Exercise regularly - walking is good for circulation Wound Treatment Wound #13 - Lower Leg Wound Laterality: Left, Posterior Cleanser: Soap and Water 1 x Per Week/15 Days Discharge Instructions: May shower and wash wound with dial antibacterial soap and water prior to dressing change. Cleanser: Vashe 5.8 (oz) 1 x Per Week/15 Days Discharge Instructions: Cleanse the wound with Vashe prior to applying a clean dressing using gauze sponges, not tissue or cotton balls. Peri-Wound Care: Sween Lotion (Moisturizing lotion) 1 x Per Week/15 Days Discharge Instructions: Apply moisturizing lotion as directed Prim Dressing: Promogran Prisma Matrix, 4.34 (sq in) (silver collagen) ary 1 x Per Week/15 Days Discharge Instructions: Moisten collagen with hydrogel Secondary Dressing: ABD Pad, 8x10 1 x Per Week/15 Days Discharge Instructions: Apply over primary dressing as directed. Secondary Dressing: Optifoam Non-Adhesive Dressing, 4x4 in 1 x Per Week/15 Days Discharge Instructions: Apply to ankle, achilles, lateral part of foot for protection Compression Wrap: Urgo K2, (equivalent to a 4 layer) two layer compression system, regular 1 x Per Week/15 Days Discharge Instructions: Apply Urgo K2 as directed (alternative to 4 layer compression). Electronic Signature(s) Signed: 03/31/2023 4:40:46 PM By: Baltazar Najjar MD Signed: 04/01/2023 4:07:14 PM By: Brenton Grills Entered By: Brenton Grills on 03/31/2023 13:56:26 -------------------------------------------------------------------------------- Problem List  Details Patient Name: Date of Service: KHAMBREL, BALENT 03/31/2023 1:45 PM Medical Record Number: [id number] Patient Account Number: [id number] Date of Birth/Sex: Treating RN: Oct 31, 1955 (67 y.o. Yates Decamp Primary Care Provider: Hillard Danker Other Clinician: Referring Provider: Treating Provider/Extender: Shara Blazing in Treatment: 4 Active Problems ICD-10 Encounter Code Description Active Date MDM Diagnosis [phone] Non-pressure chronic ulcer of other part of left lower leg with other specified 03/03/2023 No Yes severity I87.332 Chronic venous hypertension (idiopathic) with ulcer and inflammation of left 03/03/2023 No Yes lower extremity E11.622 Type 2 diabetes mellitus with other skin ulcer 03/03/2023 No Yes AZIAH, TIPPIE ([id number]) [id number].pdf Page 5 of 9 Inactive Problems Resolved Problems Electronic Signature(s) Signed: 03/31/2023 4:40:46 PM By: Baltazar Najjar MD Entered By: Baltazar Najjar on 03/31/2023 14:06:33 -------------------------------------------------------------------------------- Progress Note Details Patient Name: Date of Service: [name]. 03/31/2023 1:45 PM Medical Record Number: [id number] Patient Account Number: [id number] Date of Birth/Sex: Treating RN: 01-15-1956 (67 y.o. M) Primary Care Provider: Hillard Danker Other Clinician: Referring Provider: Treating Provider/Extender: Shara Blazing in Treatment: 4 Subjective History of Present Illness (HPI) ADMISSION 07/29/2018 Mr. Boyan is a 67 year old man with either prediabetes or diabetes he is on glipizide. In late October to November 2019 he noted a scabbed area on the back of his right calf. He picked this off a few times but it would not heal. He saw his primary physician on 12/5. It was felt at that time that this may actually heal on its own with conservative management. The next  visit was on 07/20/2018 noted the area was a lot worse and arranged for his treatment here. He has been topical antibiotics like Neosporin although he stopped using this when the wound looked worse. He is just been covering  SADAMU, DISPENZA ([id number]) [id number] Page 1 of 9 Visit Report for 03/31/2023 HPI Details Patient Name: Date of Service: LLEYTON, BAISA 03/31/2023 1:45 PM Medical Record Number: [id number] Patient Account Number: [id number] Date of Birth/Sex: Treating RN: 1955/10/31 (67 y.o. M) Primary Care Provider: Hillard Danker Other Clinician: Referring Provider: Treating Provider/Extender: Shara Blazing in Treatment: 4 History of Present Illness HPI Description: ADMISSION 07/29/2018 Mr. Chessman is a 67 year old man with either prediabetes or diabetes he is on glipizide. In late October to November 2019 he noted a scabbed area on the back of his right calf. He picked this off a few times but it would not heal. He saw his primary physician on 12/5. It was felt at that time that this may actually heal on its own with conservative management. The next visit was on 07/20/2018 noted the area was a lot worse and arranged for his treatment here. He has been topical antibiotics like Neosporin although he stopped using this when the wound looked worse. He is just been covering this with a clean Band-Aid. He is given Bactrim a week ago and he is finishing this currently. It is made some improvement in the surrounding erythema per the patient. He does not have a history of nonhealing wounds. No prior history of wounds on his legs that he had difficulty healing. No prior skin issues. 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 Past medical history; A. fib status post ablation and recent cardioversion he is on Xarelto. He has a history of systolic heart failure, cervical radiculopathy, cardiomyopathy and type 2 diabetes as discussed ABI in the right leg was noncompressible on the right 2/20; the biopsy I did on the patient last week was negative for malignancy. Culture grew Pseudomonas and methicillin sensitive staph aureus. He is  on cefdinir 300 twice a day. I will have to make this a 10 day course. He put him in compression. He states that the redness pain and erythema are a lot better. I suspect the patient has chronic venous insufficiency probably with a secondary cellulitis 2/27; arrives today with copious amounts of drainage coming out of the wound irritating the skin below the wound area. He only renewed the final 3 days of cefdinir today 3/5; came in for a nurse change 3 days ago. Again a lot of drainage noted. Zinc oxide was applied unfortunately the drainage appears to a pool then he has a string of superficial open areas extending down into the Achilles area and some just below the wound. The wound itself does not look too bad. He is completed the antibiotics although apparently there was separation from the original 7 with the last 3 days. Culture grew a few MSSA and a few Pseudomonas 3/12; not too much difference over the last week. He came in for a dressing change on Monday by our nursing staff. Necrotic debris again over the wound surface. The entire area looks irritated but nontender and I do not think shows obvious evidence of infection. We have not heard anything about the reflux studies. Also notable than in this diabetic man we had noncompressible vessels and although I can feel his pulses easily in his feet I will order arterial studies as well. 3/19-Patient had experience more pain has been dressed with a silver alginate, the wounds all have necrotic debris, very friable with easy bleeding with any kind of surface debridement including with gauze and Anasept and with a #3 curette. His vascular  May shower and wash wound with dial antibacterial soap and water prior to dressing  change. Cleanser: Vashe 5.8 (oz) 1 x Per Week/15 Days Discharge Instructions: Cleanse the wound with Vashe prior to applying a clean dressing using gauze sponges, not tissue or cotton balls. Peri-Wound Care: Sween Lotion (Moisturizing lotion) 1 x Per Week/15 Days Discharge Instructions: Apply moisturizing lotion as directed Prim Dressing: Promogran Prisma Matrix, 4.34 (sq in) (silver collagen) 1 x Per Week/15 Days ary Discharge Instructions: Moisten collagen with hydrogel Secondary Dressing: ABD Pad, 8x10 1 x Per Week/15 Days Discharge Instructions: Apply over primary dressing as directed. Secondary Dressing: Optifoam Non-Adhesive Dressing, 4x4 in 1 x Per Week/15 Days Discharge Instructions: Apply to ankle, achilles, lateral part of foot for protection Com pression Wrap: Urgo K2, (equivalent to a 4 layer) two layer compression system, regular 1 x Per Week/15 Days Discharge Instructions: Apply Urgo K2 as directed (alternative to 4 layer compression). 1. I did not change the primary dressing to the worst wound on the left posterior calf. This will continue to be Prisma hydrogel ABDs under Urgo K2 lite 2. Continue to pad the abraded area on the Achilles but this did not open and continue to pad the left foot at the base of the fifth metatarsal Electronic Signature(s) Signed: 04/01/2023 3:07:10 PM By: Shawn Stall RN, BSN Signed: 04/06/2023 6:11:24 PM By: Baltazar Najjar MD Previous Signature: 03/31/2023 4:40:46 PM Version By: Baltazar Najjar MD Entered By: Shawn Stall on 04/01/2023 15:06:53 -------------------------------------------------------------------------------- SuperBill Details Patient Name: Date of Service: [name]. 03/31/2023 Medical Record Number: [id number] Patient Account Number: [id number] Date of Birth/Sex: Treating RN: 09-18-1955 (67 y.o. M) Primary Care Provider: Hillard Danker Other Clinician: Referring Provider: Treating Provider/Extender: Shara Blazing in Treatment: 4 Diagnosis Coding ICD-10 Codes Code Description [phone] Non-pressure chronic ulcer of other part of left lower leg with other specified severity I87.332 Chronic venous hypertension (idiopathic) with ulcer and inflammation of left lower extremity E11.622 Type 2 diabetes mellitus with other skin ulcer Facility Procedures : CPT4 Code: 91478295 Description: (Facility Use Only) [phone] - APPLY MULTLAY COMPRS LWR LT LEG ICD-10 Diagnosis Description L97.828 Non-pressure chronic ulcer of other part of left lower leg with other specified sever Modifier: ity Quantity: 1 Physician Procedures : CPT4 Code Description [address] TIARNAN, OSTERMEYER ([id number]) [id number] - WC PHYS LEVEL 3 - EST PT 1 ICD-10 Diagnosis Description L97.828 Non-pressure chronic ulcer of other part of left lower leg with other  specified severity I87.332 Chronic venous hypertension (idiopathic) with ulcer and inflammation of left lower extremity Quantity: f Page 9 of 9 Electronic Signature(s) Signed: 03/31/2023 4:40:46 PM By: Baltazar Najjar MD Signed: 04/01/2023 4:07:14 PM By: Brenton Grills Entered By: Brenton Grills on 03/31/2023 14:21:40  May shower and wash wound with dial antibacterial soap and water prior to dressing  change. Cleanser: Vashe 5.8 (oz) 1 x Per Week/15 Days Discharge Instructions: Cleanse the wound with Vashe prior to applying a clean dressing using gauze sponges, not tissue or cotton balls. Peri-Wound Care: Sween Lotion (Moisturizing lotion) 1 x Per Week/15 Days Discharge Instructions: Apply moisturizing lotion as directed Prim Dressing: Promogran Prisma Matrix, 4.34 (sq in) (silver collagen) 1 x Per Week/15 Days ary Discharge Instructions: Moisten collagen with hydrogel Secondary Dressing: ABD Pad, 8x10 1 x Per Week/15 Days Discharge Instructions: Apply over primary dressing as directed. Secondary Dressing: Optifoam Non-Adhesive Dressing, 4x4 in 1 x Per Week/15 Days Discharge Instructions: Apply to ankle, achilles, lateral part of foot for protection Com pression Wrap: Urgo K2, (equivalent to a 4 layer) two layer compression system, regular 1 x Per Week/15 Days Discharge Instructions: Apply Urgo K2 as directed (alternative to 4 layer compression). 1. I did not change the primary dressing to the worst wound on the left posterior calf. This will continue to be Prisma hydrogel ABDs under Urgo K2 lite 2. Continue to pad the abraded area on the Achilles but this did not open and continue to pad the left foot at the base of the fifth metatarsal Electronic Signature(s) Signed: 04/01/2023 3:07:10 PM By: Shawn Stall RN, BSN Signed: 04/06/2023 6:11:24 PM By: Baltazar Najjar MD Previous Signature: 03/31/2023 4:40:46 PM Version By: Baltazar Najjar MD Entered By: Shawn Stall on 04/01/2023 15:06:53 -------------------------------------------------------------------------------- SuperBill Details Patient Name: Date of Service: [name]. 03/31/2023 Medical Record Number: [id number] Patient Account Number: [id number] Date of Birth/Sex: Treating RN: 09-18-1955 (67 y.o. M) Primary Care Provider: Hillard Danker Other Clinician: Referring Provider: Treating Provider/Extender: Shara Blazing in Treatment: 4 Diagnosis Coding ICD-10 Codes Code Description [phone] Non-pressure chronic ulcer of other part of left lower leg with other specified severity I87.332 Chronic venous hypertension (idiopathic) with ulcer and inflammation of left lower extremity E11.622 Type 2 diabetes mellitus with other skin ulcer Facility Procedures : CPT4 Code: 91478295 Description: (Facility Use Only) [phone] - APPLY MULTLAY COMPRS LWR LT LEG ICD-10 Diagnosis Description L97.828 Non-pressure chronic ulcer of other part of left lower leg with other specified sever Modifier: ity Quantity: 1 Physician Procedures : CPT4 Code Description [address] TIARNAN, OSTERMEYER ([id number]) [id number] - WC PHYS LEVEL 3 - EST PT 1 ICD-10 Diagnosis Description L97.828 Non-pressure chronic ulcer of other part of left lower leg with other  specified severity I87.332 Chronic venous hypertension (idiopathic) with ulcer and inflammation of left lower extremity Quantity: f Page 9 of 9 Electronic Signature(s) Signed: 03/31/2023 4:40:46 PM By: Baltazar Najjar MD Signed: 04/01/2023 4:07:14 PM By: Brenton Grills Entered By: Brenton Grills on 03/31/2023 14:21:40  Hygiene May shower with  protection but do not get wound dressing(s) wet. Protect dressing(s) with water repellant cover (for example, large plastic bag) or a cast cover and may then take shower. Edema Control - Lymphedema / SCD / Other Left Lower Extremity Elevate legs to the level of the heart or above for 30 minutes daily and/or when sitting for 3-4 times a day throughout the day. Avoid standing for long periods of time. Exercise regularly - walking is good for circulation Wound Treatment Wound #13 - Lower Leg Wound Laterality: Left, Posterior Cleanser: Soap and Water 1 x Per Week/15 Days Discharge Instructions: May shower and wash wound with dial antibacterial soap and water prior to dressing change. Cleanser: Vashe 5.8 (oz) 1 x Per Week/15 Days Discharge Instructions: Cleanse the wound with Vashe prior to applying a clean dressing using gauze sponges, not tissue or cotton balls. Peri-Wound Care: Sween Lotion (Moisturizing lotion) 1 x Per Week/15 Days Discharge Instructions: Apply moisturizing lotion as directed Prim Dressing: Promogran Prisma Matrix, 4.34 (sq in) (silver collagen) ary 1 x Per Week/15 Days Discharge Instructions: Moisten collagen with hydrogel Secondary Dressing: ABD Pad, 8x10 1 x Per Week/15 Days Discharge Instructions: Apply over primary dressing as directed. Secondary Dressing: Optifoam Non-Adhesive Dressing, 4x4 in 1 x Per Week/15 Days Discharge Instructions: Apply to ankle, achilles, lateral part of foot for protection Compression Wrap: Urgo K2, (equivalent to a 4 layer) two layer compression system, regular 1 x Per Week/15 Days Discharge Instructions: Apply Urgo K2 as directed (alternative to 4 layer compression). Electronic Signature(s) Signed: 03/31/2023 4:40:46 PM By: Baltazar Najjar MD Signed: 04/01/2023 4:07:14 PM By: Brenton Grills Entered By: Brenton Grills on 03/31/2023 13:56:26 -------------------------------------------------------------------------------- Problem List  Details Patient Name: Date of Service: KHAMBREL, BALENT 03/31/2023 1:45 PM Medical Record Number: [id number] Patient Account Number: [id number] Date of Birth/Sex: Treating RN: Oct 31, 1955 (67 y.o. Yates Decamp Primary Care Provider: Hillard Danker Other Clinician: Referring Provider: Treating Provider/Extender: Shara Blazing in Treatment: 4 Active Problems ICD-10 Encounter Code Description Active Date MDM Diagnosis [phone] Non-pressure chronic ulcer of other part of left lower leg with other specified 03/03/2023 No Yes severity I87.332 Chronic venous hypertension (idiopathic) with ulcer and inflammation of left 03/03/2023 No Yes lower extremity E11.622 Type 2 diabetes mellitus with other skin ulcer 03/03/2023 No Yes AZIAH, TIPPIE ([id number]) [id number].pdf Page 5 of 9 Inactive Problems Resolved Problems Electronic Signature(s) Signed: 03/31/2023 4:40:46 PM By: Baltazar Najjar MD Entered By: Baltazar Najjar on 03/31/2023 14:06:33 -------------------------------------------------------------------------------- Progress Note Details Patient Name: Date of Service: [name]. 03/31/2023 1:45 PM Medical Record Number: [id number] Patient Account Number: [id number] Date of Birth/Sex: Treating RN: 01-15-1956 (67 y.o. M) Primary Care Provider: Hillard Danker Other Clinician: Referring Provider: Treating Provider/Extender: Shara Blazing in Treatment: 4 Subjective History of Present Illness (HPI) ADMISSION 07/29/2018 Mr. Boyan is a 67 year old man with either prediabetes or diabetes he is on glipizide. In late October to November 2019 he noted a scabbed area on the back of his right calf. He picked this off a few times but it would not heal. He saw his primary physician on 12/5. It was felt at that time that this may actually heal on its own with conservative management. The next  visit was on 07/20/2018 noted the area was a lot worse and arranged for his treatment here. He has been topical antibiotics like Neosporin although he stopped using this when the wound looked worse. He is just been covering  leg was involved in that study or not we will need to review that. His past medical history includes type type 2 diabetes, chronic venous insufficiency with secondary lymphedema, intermittent A-fib, nonischemic cardiomyopathy, thrombus of the left  atrial appendage, hereditary hemochromatosis cytosis. He is now on Coumadin presumably for the atrial fibrillation. TEDDIE, KIMBERLEY ([id number]) [id number] Page 3 of 9 ABIs in this clinic were not repeated 9/24; posterior left calf wound. We used Hydrofera Blue under compression. The wound looks better this week better looking surface. 10/1. Posterior left calf wound secondary to venous insufficiency. He was new to the clinic in 2 weeks ago at which time I thought he was going to require a difficult set of mechanical debridements however the wound surface is continuing to improve using Hydrofera Blue although the dimensions not so remarkably improved today 10/8; posterior left calf wound secondary to chronic venous insufficiency. He continues to think nice progress here on this wound the wound is smaller better looking surface. He arrived in clinic today with an abrasion on his Achilles area on the left leg as well as what looked to be a potential deep tissue injury on the lateral lateral part of the left foot at the level of the base of the fifth metatarsal. 10/15 posterior left calf wound continues to improve. We have been using Prisma under ABDs under Urgo K2 lite's. The areas on the Achilles and the left lateral foot which were concerning last time of maintain skin integrity. He is not wearing stockings even on the right leg I talked to him about this. Electronic Signature(s) Signed: 03/31/2023 4:40:46 PM By: Baltazar Najjar MD Entered By: Baltazar Najjar on 03/31/2023 14:07:53 -------------------------------------------------------------------------------- Physical Exam Details Patient Name: Date of Service: [name]. 03/31/2023 1:45 PM Medical Record Number: [id number] Patient Account Number: [id number] Date of Birth/Sex: Treating RN: 04-04-1956 (67 y.o. M) Primary Care Provider: Hillard Danker Other Clinician: Referring Provider: Treating  Provider/Extender: Shara Blazing in Treatment: 4 Constitutional Sitting or standing Blood Pressure is within target range for patient.. Pulse regular and within target range for patient.[name] Respirations regular, non-labored and within target range.. Temperature is normal and within the target range for the patient.. Notes Wound exam; left posterior calf clean granulation no depth advancing epithelialization. The abrasion injury in the Achilles is superficial I do not think this is going to open similarly on the left lateral foot on the base of the fifth metatarsal I do not believe this will be in any threat of opening. Electronic Signature(s) Signed: 03/31/2023 4:40:46 PM By: Baltazar Najjar MD Entered By: Baltazar Najjar on 03/31/2023 14:10:16 -------------------------------------------------------------------------------- Physician Orders Details Patient Name: Date of Service: [name]. 03/31/2023 1:45 PM Medical Record Number: [id number] Patient Account Number: [id number] Date of Birth/Sex: Treating RN: [birth year] (67 y.o. Yates Decamp Primary Care Provider: Hillard Danker Other Clinician: Referring Provider: Treating Provider/Extender: Shara Blazing in Treatment: 4 The following information was scribed by: Brenton Grills The information was scribed for: Baltazar Najjar Verbal / Phone Orders: No Diagnosis Coding Follow-up Appointments ppointment in 1 week. - Dr Leanord Hawking Tuesday 04/07/2023 115pm Return A ppointment in 2 weeks. - Dr Leanord Hawking Tuesday - please make appt Return A Return appointment in 3 weeks. - Dr Leanord Hawking Tuesday (already scheduled) Return appointment in 1 month. - Dr Leanord Hawking Tuesday (front office to book appt) NATHINEL, HECKMAN ([id number]) [phone].pdf Page 4 of 9 Anesthetic (In clinic) Topical Lidocaine 4% applied to wound bed Bathing/ Shower/  SADAMU, DISPENZA ([id number]) [id number] Page 1 of 9 Visit Report for 03/31/2023 HPI Details Patient Name: Date of Service: LLEYTON, BAISA 03/31/2023 1:45 PM Medical Record Number: [id number] Patient Account Number: [id number] Date of Birth/Sex: Treating RN: 1955/10/31 (67 y.o. M) Primary Care Provider: Hillard Danker Other Clinician: Referring Provider: Treating Provider/Extender: Shara Blazing in Treatment: 4 History of Present Illness HPI Description: ADMISSION 07/29/2018 Mr. Chessman is a 67 year old man with either prediabetes or diabetes he is on glipizide. In late October to November 2019 he noted a scabbed area on the back of his right calf. He picked this off a few times but it would not heal. He saw his primary physician on 12/5. It was felt at that time that this may actually heal on its own with conservative management. The next visit was on 07/20/2018 noted the area was a lot worse and arranged for his treatment here. He has been topical antibiotics like Neosporin although he stopped using this when the wound looked worse. He is just been covering this with a clean Band-Aid. He is given Bactrim a week ago and he is finishing this currently. It is made some improvement in the surrounding erythema per the patient. He does not have a history of nonhealing wounds. No prior history of wounds on his legs that he had difficulty healing. No prior skin issues. 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 Past medical history; A. fib status post ablation and recent cardioversion he is on Xarelto. He has a history of systolic heart failure, cervical radiculopathy, cardiomyopathy and type 2 diabetes as discussed ABI in the right leg was noncompressible on the right 2/20; the biopsy I did on the patient last week was negative for malignancy. Culture grew Pseudomonas and methicillin sensitive staph aureus. He is  on cefdinir 300 twice a day. I will have to make this a 10 day course. He put him in compression. He states that the redness pain and erythema are a lot better. I suspect the patient has chronic venous insufficiency probably with a secondary cellulitis 2/27; arrives today with copious amounts of drainage coming out of the wound irritating the skin below the wound area. He only renewed the final 3 days of cefdinir today 3/5; came in for a nurse change 3 days ago. Again a lot of drainage noted. Zinc oxide was applied unfortunately the drainage appears to a pool then he has a string of superficial open areas extending down into the Achilles area and some just below the wound. The wound itself does not look too bad. He is completed the antibiotics although apparently there was separation from the original 7 with the last 3 days. Culture grew a few MSSA and a few Pseudomonas 3/12; not too much difference over the last week. He came in for a dressing change on Monday by our nursing staff. Necrotic debris again over the wound surface. The entire area looks irritated but nontender and I do not think shows obvious evidence of infection. We have not heard anything about the reflux studies. Also notable than in this diabetic man we had noncompressible vessels and although I can feel his pulses easily in his feet I will order arterial studies as well. 3/19-Patient had experience more pain has been dressed with a silver alginate, the wounds all have necrotic debris, very friable with easy bleeding with any kind of surface debridement including with gauze and Anasept and with a #3 curette. His vascular  SADAMU, DISPENZA ([id number]) [id number] Page 1 of 9 Visit Report for 03/31/2023 HPI Details Patient Name: Date of Service: LLEYTON, BAISA 03/31/2023 1:45 PM Medical Record Number: [id number] Patient Account Number: [id number] Date of Birth/Sex: Treating RN: 1955/10/31 (67 y.o. M) Primary Care Provider: Hillard Danker Other Clinician: Referring Provider: Treating Provider/Extender: Shara Blazing in Treatment: 4 History of Present Illness HPI Description: ADMISSION 07/29/2018 Mr. Chessman is a 67 year old man with either prediabetes or diabetes he is on glipizide. In late October to November 2019 he noted a scabbed area on the back of his right calf. He picked this off a few times but it would not heal. He saw his primary physician on 12/5. It was felt at that time that this may actually heal on its own with conservative management. The next visit was on 07/20/2018 noted the area was a lot worse and arranged for his treatment here. He has been topical antibiotics like Neosporin although he stopped using this when the wound looked worse. He is just been covering this with a clean Band-Aid. He is given Bactrim a week ago and he is finishing this currently. It is made some improvement in the surrounding erythema per the patient. He does not have a history of nonhealing wounds. No prior history of wounds on his legs that he had difficulty healing. No prior skin issues. 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 Past medical history; A. fib status post ablation and recent cardioversion he is on Xarelto. He has a history of systolic heart failure, cervical radiculopathy, cardiomyopathy and type 2 diabetes as discussed ABI in the right leg was noncompressible on the right 2/20; the biopsy I did on the patient last week was negative for malignancy. Culture grew Pseudomonas and methicillin sensitive staph aureus. He is  on cefdinir 300 twice a day. I will have to make this a 10 day course. He put him in compression. He states that the redness pain and erythema are a lot better. I suspect the patient has chronic venous insufficiency probably with a secondary cellulitis 2/27; arrives today with copious amounts of drainage coming out of the wound irritating the skin below the wound area. He only renewed the final 3 days of cefdinir today 3/5; came in for a nurse change 3 days ago. Again a lot of drainage noted. Zinc oxide was applied unfortunately the drainage appears to a pool then he has a string of superficial open areas extending down into the Achilles area and some just below the wound. The wound itself does not look too bad. He is completed the antibiotics although apparently there was separation from the original 7 with the last 3 days. Culture grew a few MSSA and a few Pseudomonas 3/12; not too much difference over the last week. He came in for a dressing change on Monday by our nursing staff. Necrotic debris again over the wound surface. The entire area looks irritated but nontender and I do not think shows obvious evidence of infection. We have not heard anything about the reflux studies. Also notable than in this diabetic man we had noncompressible vessels and although I can feel his pulses easily in his feet I will order arterial studies as well. 3/19-Patient had experience more pain has been dressed with a silver alginate, the wounds all have necrotic debris, very friable with easy bleeding with any kind of surface debridement including with gauze and Anasept and with a #3 curette. His vascular  May shower and wash wound with dial antibacterial soap and water prior to dressing  change. Cleanser: Vashe 5.8 (oz) 1 x Per Week/15 Days Discharge Instructions: Cleanse the wound with Vashe prior to applying a clean dressing using gauze sponges, not tissue or cotton balls. Peri-Wound Care: Sween Lotion (Moisturizing lotion) 1 x Per Week/15 Days Discharge Instructions: Apply moisturizing lotion as directed Prim Dressing: Promogran Prisma Matrix, 4.34 (sq in) (silver collagen) 1 x Per Week/15 Days ary Discharge Instructions: Moisten collagen with hydrogel Secondary Dressing: ABD Pad, 8x10 1 x Per Week/15 Days Discharge Instructions: Apply over primary dressing as directed. Secondary Dressing: Optifoam Non-Adhesive Dressing, 4x4 in 1 x Per Week/15 Days Discharge Instructions: Apply to ankle, achilles, lateral part of foot for protection Com pression Wrap: Urgo K2, (equivalent to a 4 layer) two layer compression system, regular 1 x Per Week/15 Days Discharge Instructions: Apply Urgo K2 as directed (alternative to 4 layer compression). 1. I did not change the primary dressing to the worst wound on the left posterior calf. This will continue to be Prisma hydrogel ABDs under Urgo K2 lite 2. Continue to pad the abraded area on the Achilles but this did not open and continue to pad the left foot at the base of the fifth metatarsal Electronic Signature(s) Signed: 04/01/2023 3:07:10 PM By: Shawn Stall RN, BSN Signed: 04/06/2023 6:11:24 PM By: Baltazar Najjar MD Previous Signature: 03/31/2023 4:40:46 PM Version By: Baltazar Najjar MD Entered By: Shawn Stall on 04/01/2023 15:06:53 -------------------------------------------------------------------------------- SuperBill Details Patient Name: Date of Service: [name]. 03/31/2023 Medical Record Number: [id number] Patient Account Number: [id number] Date of Birth/Sex: Treating RN: 09-18-1955 (67 y.o. M) Primary Care Provider: Hillard Danker Other Clinician: Referring Provider: Treating Provider/Extender: Shara Blazing in Treatment: 4 Diagnosis Coding ICD-10 Codes Code Description [phone] Non-pressure chronic ulcer of other part of left lower leg with other specified severity I87.332 Chronic venous hypertension (idiopathic) with ulcer and inflammation of left lower extremity E11.622 Type 2 diabetes mellitus with other skin ulcer Facility Procedures : CPT4 Code: 91478295 Description: (Facility Use Only) [phone] - APPLY MULTLAY COMPRS LWR LT LEG ICD-10 Diagnosis Description L97.828 Non-pressure chronic ulcer of other part of left lower leg with other specified sever Modifier: ity Quantity: 1 Physician Procedures : CPT4 Code Description [address] TIARNAN, OSTERMEYER ([id number]) [id number] - WC PHYS LEVEL 3 - EST PT 1 ICD-10 Diagnosis Description L97.828 Non-pressure chronic ulcer of other part of left lower leg with other  specified severity I87.332 Chronic venous hypertension (idiopathic) with ulcer and inflammation of left lower extremity Quantity: f Page 9 of 9 Electronic Signature(s) Signed: 03/31/2023 4:40:46 PM By: Baltazar Najjar MD Signed: 04/01/2023 4:07:14 PM By: Brenton Grills Entered By: Brenton Grills on 03/31/2023 14:21:40  SADAMU, DISPENZA ([id number]) [id number] Page 1 of 9 Visit Report for 03/31/2023 HPI Details Patient Name: Date of Service: LLEYTON, BAISA 03/31/2023 1:45 PM Medical Record Number: [id number] Patient Account Number: [id number] Date of Birth/Sex: Treating RN: 1955/10/31 (67 y.o. M) Primary Care Provider: Hillard Danker Other Clinician: Referring Provider: Treating Provider/Extender: Shara Blazing in Treatment: 4 History of Present Illness HPI Description: ADMISSION 07/29/2018 Mr. Chessman is a 67 year old man with either prediabetes or diabetes he is on glipizide. In late October to November 2019 he noted a scabbed area on the back of his right calf. He picked this off a few times but it would not heal. He saw his primary physician on 12/5. It was felt at that time that this may actually heal on its own with conservative management. The next visit was on 07/20/2018 noted the area was a lot worse and arranged for his treatment here. He has been topical antibiotics like Neosporin although he stopped using this when the wound looked worse. He is just been covering this with a clean Band-Aid. He is given Bactrim a week ago and he is finishing this currently. It is made some improvement in the surrounding erythema per the patient. He does not have a history of nonhealing wounds. No prior history of wounds on his legs that he had difficulty healing. No prior skin issues. 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 Past medical history; A. fib status post ablation and recent cardioversion he is on Xarelto. He has a history of systolic heart failure, cervical radiculopathy, cardiomyopathy and type 2 diabetes as discussed ABI in the right leg was noncompressible on the right 2/20; the biopsy I did on the patient last week was negative for malignancy. Culture grew Pseudomonas and methicillin sensitive staph aureus. He is  on cefdinir 300 twice a day. I will have to make this a 10 day course. He put him in compression. He states that the redness pain and erythema are a lot better. I suspect the patient has chronic venous insufficiency probably with a secondary cellulitis 2/27; arrives today with copious amounts of drainage coming out of the wound irritating the skin below the wound area. He only renewed the final 3 days of cefdinir today 3/5; came in for a nurse change 3 days ago. Again a lot of drainage noted. Zinc oxide was applied unfortunately the drainage appears to a pool then he has a string of superficial open areas extending down into the Achilles area and some just below the wound. The wound itself does not look too bad. He is completed the antibiotics although apparently there was separation from the original 7 with the last 3 days. Culture grew a few MSSA and a few Pseudomonas 3/12; not too much difference over the last week. He came in for a dressing change on Monday by our nursing staff. Necrotic debris again over the wound surface. The entire area looks irritated but nontender and I do not think shows obvious evidence of infection. We have not heard anything about the reflux studies. Also notable than in this diabetic man we had noncompressible vessels and although I can feel his pulses easily in his feet I will order arterial studies as well. 3/19-Patient had experience more pain has been dressed with a silver alginate, the wounds all have necrotic debris, very friable with easy bleeding with any kind of surface debridement including with gauze and Anasept and with a #3 curette. His vascular  SADAMU, DISPENZA ([id number]) [id number] Page 1 of 9 Visit Report for 03/31/2023 HPI Details Patient Name: Date of Service: LLEYTON, BAISA 03/31/2023 1:45 PM Medical Record Number: [id number] Patient Account Number: [id number] Date of Birth/Sex: Treating RN: 1955/10/31 (67 y.o. M) Primary Care Provider: Hillard Danker Other Clinician: Referring Provider: Treating Provider/Extender: Shara Blazing in Treatment: 4 History of Present Illness HPI Description: ADMISSION 07/29/2018 Mr. Chessman is a 67 year old man with either prediabetes or diabetes he is on glipizide. In late October to November 2019 he noted a scabbed area on the back of his right calf. He picked this off a few times but it would not heal. He saw his primary physician on 12/5. It was felt at that time that this may actually heal on its own with conservative management. The next visit was on 07/20/2018 noted the area was a lot worse and arranged for his treatment here. He has been topical antibiotics like Neosporin although he stopped using this when the wound looked worse. He is just been covering this with a clean Band-Aid. He is given Bactrim a week ago and he is finishing this currently. It is made some improvement in the surrounding erythema per the patient. He does not have a history of nonhealing wounds. No prior history of wounds on his legs that he had difficulty healing. No prior skin issues. 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 Past medical history; A. fib status post ablation and recent cardioversion he is on Xarelto. He has a history of systolic heart failure, cervical radiculopathy, cardiomyopathy and type 2 diabetes as discussed ABI in the right leg was noncompressible on the right 2/20; the biopsy I did on the patient last week was negative for malignancy. Culture grew Pseudomonas and methicillin sensitive staph aureus. He is  on cefdinir 300 twice a day. I will have to make this a 10 day course. He put him in compression. He states that the redness pain and erythema are a lot better. I suspect the patient has chronic venous insufficiency probably with a secondary cellulitis 2/27; arrives today with copious amounts of drainage coming out of the wound irritating the skin below the wound area. He only renewed the final 3 days of cefdinir today 3/5; came in for a nurse change 3 days ago. Again a lot of drainage noted. Zinc oxide was applied unfortunately the drainage appears to a pool then he has a string of superficial open areas extending down into the Achilles area and some just below the wound. The wound itself does not look too bad. He is completed the antibiotics although apparently there was separation from the original 7 with the last 3 days. Culture grew a few MSSA and a few Pseudomonas 3/12; not too much difference over the last week. He came in for a dressing change on Monday by our nursing staff. Necrotic debris again over the wound surface. The entire area looks irritated but nontender and I do not think shows obvious evidence of infection. We have not heard anything about the reflux studies. Also notable than in this diabetic man we had noncompressible vessels and although I can feel his pulses easily in his feet I will order arterial studies as well. 3/19-Patient had experience more pain has been dressed with a silver alginate, the wounds all have necrotic debris, very friable with easy bleeding with any kind of surface debridement including with gauze and Anasept and with a #3 curette. His vascular  Hygiene May shower with  protection but do not get wound dressing(s) wet. Protect dressing(s) with water repellant cover (for example, large plastic bag) or a cast cover and may then take shower. Edema Control - Lymphedema / SCD / Other Left Lower Extremity Elevate legs to the level of the heart or above for 30 minutes daily and/or when sitting for 3-4 times a day throughout the day. Avoid standing for long periods of time. Exercise regularly - walking is good for circulation Wound Treatment Wound #13 - Lower Leg Wound Laterality: Left, Posterior Cleanser: Soap and Water 1 x Per Week/15 Days Discharge Instructions: May shower and wash wound with dial antibacterial soap and water prior to dressing change. Cleanser: Vashe 5.8 (oz) 1 x Per Week/15 Days Discharge Instructions: Cleanse the wound with Vashe prior to applying a clean dressing using gauze sponges, not tissue or cotton balls. Peri-Wound Care: Sween Lotion (Moisturizing lotion) 1 x Per Week/15 Days Discharge Instructions: Apply moisturizing lotion as directed Prim Dressing: Promogran Prisma Matrix, 4.34 (sq in) (silver collagen) ary 1 x Per Week/15 Days Discharge Instructions: Moisten collagen with hydrogel Secondary Dressing: ABD Pad, 8x10 1 x Per Week/15 Days Discharge Instructions: Apply over primary dressing as directed. Secondary Dressing: Optifoam Non-Adhesive Dressing, 4x4 in 1 x Per Week/15 Days Discharge Instructions: Apply to ankle, achilles, lateral part of foot for protection Compression Wrap: Urgo K2, (equivalent to a 4 layer) two layer compression system, regular 1 x Per Week/15 Days Discharge Instructions: Apply Urgo K2 as directed (alternative to 4 layer compression). Electronic Signature(s) Signed: 03/31/2023 4:40:46 PM By: Baltazar Najjar MD Signed: 04/01/2023 4:07:14 PM By: Brenton Grills Entered By: Brenton Grills on 03/31/2023 13:56:26 -------------------------------------------------------------------------------- Problem List  Details Patient Name: Date of Service: KHAMBREL, BALENT 03/31/2023 1:45 PM Medical Record Number: [id number] Patient Account Number: [id number] Date of Birth/Sex: Treating RN: Oct 31, 1955 (67 y.o. Yates Decamp Primary Care Provider: Hillard Danker Other Clinician: Referring Provider: Treating Provider/Extender: Shara Blazing in Treatment: 4 Active Problems ICD-10 Encounter Code Description Active Date MDM Diagnosis [phone] Non-pressure chronic ulcer of other part of left lower leg with other specified 03/03/2023 No Yes severity I87.332 Chronic venous hypertension (idiopathic) with ulcer and inflammation of left 03/03/2023 No Yes lower extremity E11.622 Type 2 diabetes mellitus with other skin ulcer 03/03/2023 No Yes AZIAH, TIPPIE ([id number]) [id number].pdf Page 5 of 9 Inactive Problems Resolved Problems Electronic Signature(s) Signed: 03/31/2023 4:40:46 PM By: Baltazar Najjar MD Entered By: Baltazar Najjar on 03/31/2023 14:06:33 -------------------------------------------------------------------------------- Progress Note Details Patient Name: Date of Service: [name]. 03/31/2023 1:45 PM Medical Record Number: [id number] Patient Account Number: [id number] Date of Birth/Sex: Treating RN: 01-15-1956 (67 y.o. M) Primary Care Provider: Hillard Danker Other Clinician: Referring Provider: Treating Provider/Extender: Shara Blazing in Treatment: 4 Subjective History of Present Illness (HPI) ADMISSION 07/29/2018 Mr. Boyan is a 67 year old man with either prediabetes or diabetes he is on glipizide. In late October to November 2019 he noted a scabbed area on the back of his right calf. He picked this off a few times but it would not heal. He saw his primary physician on 12/5. It was felt at that time that this may actually heal on its own with conservative management. The next  visit was on 07/20/2018 noted the area was a lot worse and arranged for his treatment here. He has been topical antibiotics like Neosporin although he stopped using this when the wound looked worse. He is just been covering

## 2023-04-07 NOTE — Progress Notes (Signed)
cover (for example, large plastic bag) or a cast cover and may then take shower. Edema Control - Lymphedema / SCD / Other Left Lower Extremity Elevate legs to the level of the heart or above for 30 minutes daily and/or when sitting for 3-4 times a day throughout the day. Avoid standing for long periods of time. Exercise regularly - walking is good for circulation Wound Treatment Wound #13 - Lower Leg Wound Laterality: Left, Posterior Cleanser: Soap and Water 1 x Per Week/15 Days Discharge Instructions: May shower and wash wound with dial antibacterial soap and water prior to dressing change. Cleanser: Vashe 5.8 (oz) 1 x Per Week/15 Days Discharge Instructions: Cleanse the wound with Vashe prior to applying a clean dressing using gauze sponges, not tissue or cotton balls. Peri-Wound Care: Sween Lotion (Moisturizing lotion) 1 x Per Week/15 Days Discharge Instructions: Apply moisturizing lotion as directed Prim Dressing: Hydrofera Blue Ready Transfer Foam, 4x5 (in/in) 1 x Per Week/15 Days ary Discharge Instructions: Apply to wound bed as instructed Secondary Dressing: ABD Pad, 8x10 1 x Per Week/15 Days Discharge Instructions: Apply over primary dressing as directed. Secondary Dressing: Optifoam Non-Adhesive Dressing, 4x4 in 1 x Per Week/15 Days Discharge Instructions: Apply to ankle, achilles, lateral part of foot for protection Compression Wrap: Urgo K2, (equivalent to a 4 layer) two layer compression system, regular 1 x Per Week/15 Days Discharge Instructions: Apply Urgo K2 as directed (alternative to 4 layer compression). Electronic Signature(s) Signed: 04/07/2023 4:45:15 PM By: Fonnie Mu RN Signed: 04/07/2023 4:45:38 PM By: Baltazar Najjar MD Entered By: Fonnie Mu on 04/07/2023 14:09:10 -------------------------------------------------------------------------------- Problem List Details Patient Name: Date of Service: [name] 04/07/2023  1:15 PM Medical Record Number: [id number] Patient Account Number: [id number] Date of Birth/Sex: Treating RN: 1956/04/03 (67 y.o. M) Primary Care Provider: Hillard Danker Other Clinician: Referring Provider: Treating Provider/Extender: Shara Blazing in Treatment: 5 Active Problems ICD-10 Encounter Code Description Active Date MDM Diagnosis L97.828 Non-pressure chronic ulcer of other part of left lower leg with other specified 03/03/2023 No Yes severity I87.332 Chronic venous hypertension (idiopathic) with ulcer and inflammation of left 03/03/2023 No Yes lower extremity E11.622 Type 2 diabetes mellitus with other skin ulcer 03/03/2023 No Yes MAXIE, KLEMP ([id number]) [phone].pdf Page 5 of 9 Inactive Problems Resolved Problems Electronic Signature(s) Signed: 04/07/2023 4:45:38 PM By: Baltazar Najjar MD Entered By: Baltazar Najjar on 04/07/2023 14:11:12 -------------------------------------------------------------------------------- Progress Note Details Patient Name: Date of Service: [name]. 04/07/2023 1:15 PM Medical Record Number: [id number] Patient Account Number: [id number] Date of Birth/Sex: Treating RN: Sep 15, 1955 (67 y.o. M) Primary Care Provider: Hillard Danker Other Clinician: Referring Provider: Treating Provider/Extender: Shara Blazing in Treatment: 5 Subjective History of Present Illness (HPI) ADMISSION 07/29/2018 [name] is a 66 year old man with either prediabetes or diabetes he is on glipizide. In late October to November 2019 he noted a scabbed area on the back of his right calf. He picked this off a few times but it would not heal. He saw his primary physician on 12/5. It was felt at that time that this may actually heal on its own with conservative management. The next visit was on 07/20/2018 noted the area was a lot worse and arranged for his treatment here.  He has been topical antibiotics like Neosporin although he stopped using this when the wound looked worse. He is just been covering this with a clean Band-Aid. He is given Bactrim a week ago and he is finishing this currently.  leg was involved in that study or not we will need to review that. His past medical history includes type type 2 diabetes, chronic venous insufficiency with secondary lymphedema, intermittent A-fib, nonischemic cardiomyopathy, thrombus of the left  atrial appendage, hereditary hemochromatosis cytosis. He is now on Coumadin presumably for the atrial fibrillation. [name] ([id number]) [id number] Page 3 of 9 ABIs in this clinic were not repeated 9/24; posterior left calf wound. We used Hydrofera Blue under compression. The wound looks better this week better looking surface. 10/1. Posterior left calf wound secondary to venous insufficiency. He was new to the clinic in 2 weeks ago at which time I thought he was going to require a difficult set of mechanical debridements however the wound surface is continuing to improve using Hydrofera Blue although the dimensions not so remarkably improved today 10/8; posterior left calf wound secondary to chronic venous insufficiency. He continues to think nice progress here on this wound the wound is smaller better looking surface. He arrived in clinic today with an abrasion on his Achilles area on the left leg as well as what looked to be a potential deep tissue injury on the lateral lateral part of the left foot at the level of the base of the fifth metatarsal. 10/15 posterior left calf wound continues to improve. We have been using Prisma under ABDs under Urgo K2 lite's. The areas on the Achilles and the left lateral foot which were concerning last time of maintain skin integrity. He is not wearing stockings even on the right leg I talked to him about this. 10/22; posterior left calf wound measures slightly larger today. The abrasion injury in the Achilles send on the lateral fifth metatarsal base look improved. We have been using Prisma under compression I am going to change this to [facility] today Electronic Signature(s) Signed: 04/07/2023 4:45:38 PM By: Baltazar Najjar MD Entered By: Baltazar Najjar on 04/07/2023 14:12:03 -------------------------------------------------------------------------------- Physical Exam Details Patient Name: Date of  Service: [name]. 04/07/2023 1:15 PM Medical Record Number: [id number] Patient Account Number: [id number] Date of Birth/Sex: Treating RN: Jul 19, 1955 (67 y.o. M) Primary Care Provider: Hillard Danker Other Clinician: Referring Provider: Treating Provider/Extender: Shara Blazing in Treatment: 5 Constitutional Sitting or standing Blood Pressure is within target range for patient.. Pulse regular and within target range for patient.[name] Respirations regular, non-labored and within target range.. Temperature is normal and within the target range for the patient.[name] Appears in no distress. Cardiovascular Pedal pulses are palpable. Notes Wound exam; left posterior calf. Clean granulation with rims of epithelialization. Medially a fair amount of epithelialization. The abrasion injury on the Achilles and the left lateral foot looks stable to improved Electronic Signature(s) Signed: 04/07/2023 4:45:38 PM By: Baltazar Najjar MD Entered By: Baltazar Najjar on 04/07/2023 14:13:06 -------------------------------------------------------------------------------- Physician Orders Details Patient Name: Date of Service: MELVERN, FAUBION 04/07/2023 1:15 PM Medical Record Number: [id number] Patient Account Number: [id number] Date of Birth/Sex: Treating RN: Nov 06, 1955 (67 y.o. Lucious Groves Primary Care Provider: Hillard Danker Other Clinician: Referring Provider: Treating Provider/Extender: Shara Blazing in Treatment: 5 Verbal / Phone Orders: No Diagnosis Coding Follow-up Appointments ppointment in 1 week. - Tuesday 04/14/23 @ 1:15 w/ Dr. Leanord Hawking and Yvonne Kendall Rm # 7 Return A Hampton, PAITYN PHIMMASONE ([id number]) [id number] Page 4 of 9 Anesthetic (In clinic) Topical Lidocaine 4% applied to wound bed Bathing/ Shower/ Hygiene May shower with protection but do not get wound dressing(s) wet. Protect dressing(s)  with water repellant  cover (for example, large plastic bag) or a cast cover and may then take shower. Edema Control - Lymphedema / SCD / Other Left Lower Extremity Elevate legs to the level of the heart or above for 30 minutes daily and/or when sitting for 3-4 times a day throughout the day. Avoid standing for long periods of time. Exercise regularly - walking is good for circulation Wound Treatment Wound #13 - Lower Leg Wound Laterality: Left, Posterior Cleanser: Soap and Water 1 x Per Week/15 Days Discharge Instructions: May shower and wash wound with dial antibacterial soap and water prior to dressing change. Cleanser: Vashe 5.8 (oz) 1 x Per Week/15 Days Discharge Instructions: Cleanse the wound with Vashe prior to applying a clean dressing using gauze sponges, not tissue or cotton balls. Peri-Wound Care: Sween Lotion (Moisturizing lotion) 1 x Per Week/15 Days Discharge Instructions: Apply moisturizing lotion as directed Prim Dressing: Hydrofera Blue Ready Transfer Foam, 4x5 (in/in) 1 x Per Week/15 Days ary Discharge Instructions: Apply to wound bed as instructed Secondary Dressing: ABD Pad, 8x10 1 x Per Week/15 Days Discharge Instructions: Apply over primary dressing as directed. Secondary Dressing: Optifoam Non-Adhesive Dressing, 4x4 in 1 x Per Week/15 Days Discharge Instructions: Apply to ankle, achilles, lateral part of foot for protection Compression Wrap: Urgo K2, (equivalent to a 4 layer) two layer compression system, regular 1 x Per Week/15 Days Discharge Instructions: Apply Urgo K2 as directed (alternative to 4 layer compression). Electronic Signature(s) Signed: 04/07/2023 4:45:15 PM By: Fonnie Mu RN Signed: 04/07/2023 4:45:38 PM By: Baltazar Najjar MD Entered By: Fonnie Mu on 04/07/2023 14:09:10 -------------------------------------------------------------------------------- Problem List Details Patient Name: Date of Service: [name] 04/07/2023  1:15 PM Medical Record Number: [id number] Patient Account Number: [id number] Date of Birth/Sex: Treating RN: 1956/04/03 (67 y.o. M) Primary Care Provider: Hillard Danker Other Clinician: Referring Provider: Treating Provider/Extender: Shara Blazing in Treatment: 5 Active Problems ICD-10 Encounter Code Description Active Date MDM Diagnosis L97.828 Non-pressure chronic ulcer of other part of left lower leg with other specified 03/03/2023 No Yes severity I87.332 Chronic venous hypertension (idiopathic) with ulcer and inflammation of left 03/03/2023 No Yes lower extremity E11.622 Type 2 diabetes mellitus with other skin ulcer 03/03/2023 No Yes MAXIE, KLEMP ([id number]) [phone].pdf Page 5 of 9 Inactive Problems Resolved Problems Electronic Signature(s) Signed: 04/07/2023 4:45:38 PM By: Baltazar Najjar MD Entered By: Baltazar Najjar on 04/07/2023 14:11:12 -------------------------------------------------------------------------------- Progress Note Details Patient Name: Date of Service: [name]. 04/07/2023 1:15 PM Medical Record Number: [id number] Patient Account Number: [id number] Date of Birth/Sex: Treating RN: Sep 15, 1955 (67 y.o. M) Primary Care Provider: Hillard Danker Other Clinician: Referring Provider: Treating Provider/Extender: Shara Blazing in Treatment: 5 Subjective History of Present Illness (HPI) ADMISSION 07/29/2018 [name] is a 66 year old man with either prediabetes or diabetes he is on glipizide. In late October to November 2019 he noted a scabbed area on the back of his right calf. He picked this off a few times but it would not heal. He saw his primary physician on 12/5. It was felt at that time that this may actually heal on its own with conservative management. The next visit was on 07/20/2018 noted the area was a lot worse and arranged for his treatment here.  He has been topical antibiotics like Neosporin although he stopped using this when the wound looked worse. He is just been covering this with a clean Band-Aid. He is given Bactrim a week ago and he is finishing this currently.  LIBORIO, CRAYCRAFT ([id number]) [id number] Page 1 of 9 Visit Report for 04/07/2023 HPI Details Patient Name: Date of Service: NIX, ELZEY 04/07/2023 1:15 PM Medical Record Number: [id number] Patient Account Number: [id number] Date of Birth/Sex: Treating RN: [birth year] (67 y.o. M) Primary Care Provider: Hillard Danker Other Clinician: Referring Provider: Treating Provider/Extender: Shara Blazing in Treatment: 5 History of Present Illness HPI Description: ADMISSION 07/29/2018 Mr. Lisonbee is a 67 year old man with either prediabetes or diabetes he is on glipizide. In late October to November 2019 he noted a scabbed area on the back of his right calf. He picked this off a few times but it would not heal. He saw his primary physician on 12/5. It was felt at that time that this may actually heal on its own with conservative management. The next visit was on 07/20/2018 noted the area was a lot worse and arranged for his treatment here. He has been topical antibiotics like Neosporin although he stopped using this when the wound looked worse. He is just been covering this with a clean Band-Aid. He is given Bactrim a week ago and he is finishing this currently. It is made some improvement in the surrounding erythema per the patient. He does not have a history of nonhealing wounds. No prior history of wounds on his legs that he had difficulty healing. No prior skin issues. 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 Past medical history; A. fib status post ablation and recent cardioversion he is on Xarelto. He has a history of systolic heart failure, cervical radiculopathy, cardiomyopathy and type 2 diabetes as discussed ABI in the right leg was noncompressible on the right 2/20; the biopsy I did on the patient last week was negative for malignancy. Culture grew Pseudomonas and methicillin sensitive staph aureus. He is  on cefdinir 300 twice a day. I will have to make this a 10 day course. He put him in compression. He states that the redness pain and erythema are a lot better. I suspect the patient has chronic venous insufficiency probably with a secondary cellulitis 2/27; arrives today with copious amounts of drainage coming out of the wound irritating the skin below the wound area. He only renewed the final 3 days of cefdinir today 3/5; came in for a nurse change 3 days ago. Again a lot of drainage noted. Zinc oxide was applied unfortunately the drainage appears to a pool then he has a string of superficial open areas extending down into the Achilles area and some just below the wound. The wound itself does not look too bad. He is completed the antibiotics although apparently there was separation from the original 7 with the last 3 days. Culture grew a few MSSA and a few Pseudomonas 3/12; not too much difference over the last week. He came in for a dressing change on Monday by our nursing staff. Necrotic debris again over the wound surface. The entire area looks irritated but nontender and I do not think shows obvious evidence of infection. We have not heard anything about the reflux studies. Also notable than in this diabetic man we had noncompressible vessels and although I can feel his pulses easily in his feet I will order arterial studies as well. 3/19-Patient had experience more pain has been dressed with a silver alginate, the wounds all have necrotic debris, very friable with easy bleeding with any kind of surface debridement including with gauze and Anasept and with a #3 curette. His vascular  leg was involved in that study or not we will need to review that. His past medical history includes type type 2 diabetes, chronic venous insufficiency with secondary lymphedema, intermittent A-fib, nonischemic cardiomyopathy, thrombus of the left  atrial appendage, hereditary hemochromatosis cytosis. He is now on Coumadin presumably for the atrial fibrillation. [name] ([id number]) [id number] Page 3 of 9 ABIs in this clinic were not repeated 9/24; posterior left calf wound. We used Hydrofera Blue under compression. The wound looks better this week better looking surface. 10/1. Posterior left calf wound secondary to venous insufficiency. He was new to the clinic in 2 weeks ago at which time I thought he was going to require a difficult set of mechanical debridements however the wound surface is continuing to improve using Hydrofera Blue although the dimensions not so remarkably improved today 10/8; posterior left calf wound secondary to chronic venous insufficiency. He continues to think nice progress here on this wound the wound is smaller better looking surface. He arrived in clinic today with an abrasion on his Achilles area on the left leg as well as what looked to be a potential deep tissue injury on the lateral lateral part of the left foot at the level of the base of the fifth metatarsal. 10/15 posterior left calf wound continues to improve. We have been using Prisma under ABDs under Urgo K2 lite's. The areas on the Achilles and the left lateral foot which were concerning last time of maintain skin integrity. He is not wearing stockings even on the right leg I talked to him about this. 10/22; posterior left calf wound measures slightly larger today. The abrasion injury in the Achilles send on the lateral fifth metatarsal base look improved. We have been using Prisma under compression I am going to change this to [facility] today Electronic Signature(s) Signed: 04/07/2023 4:45:38 PM By: Baltazar Najjar MD Entered By: Baltazar Najjar on 04/07/2023 14:12:03 -------------------------------------------------------------------------------- Physical Exam Details Patient Name: Date of  Service: [name]. 04/07/2023 1:15 PM Medical Record Number: [id number] Patient Account Number: [id number] Date of Birth/Sex: Treating RN: Jul 19, 1955 (67 y.o. M) Primary Care Provider: Hillard Danker Other Clinician: Referring Provider: Treating Provider/Extender: Shara Blazing in Treatment: 5 Constitutional Sitting or standing Blood Pressure is within target range for patient.. Pulse regular and within target range for patient.[name] Respirations regular, non-labored and within target range.. Temperature is normal and within the target range for the patient.[name] Appears in no distress. Cardiovascular Pedal pulses are palpable. Notes Wound exam; left posterior calf. Clean granulation with rims of epithelialization. Medially a fair amount of epithelialization. The abrasion injury on the Achilles and the left lateral foot looks stable to improved Electronic Signature(s) Signed: 04/07/2023 4:45:38 PM By: Baltazar Najjar MD Entered By: Baltazar Najjar on 04/07/2023 14:13:06 -------------------------------------------------------------------------------- Physician Orders Details Patient Name: Date of Service: MELVERN, FAUBION 04/07/2023 1:15 PM Medical Record Number: [id number] Patient Account Number: [id number] Date of Birth/Sex: Treating RN: Nov 06, 1955 (67 y.o. Lucious Groves Primary Care Provider: Hillard Danker Other Clinician: Referring Provider: Treating Provider/Extender: Shara Blazing in Treatment: 5 Verbal / Phone Orders: No Diagnosis Coding Follow-up Appointments ppointment in 1 week. - Tuesday 04/14/23 @ 1:15 w/ Dr. Leanord Hawking and Yvonne Kendall Rm # 7 Return A Hampton, PAITYN PHIMMASONE ([id number]) [id number] Page 4 of 9 Anesthetic (In clinic) Topical Lidocaine 4% applied to wound bed Bathing/ Shower/ Hygiene May shower with protection but do not get wound dressing(s) wet. Protect dressing(s)  with water repellant  LIBORIO, CRAYCRAFT ([id number]) [id number] Page 1 of 9 Visit Report for 04/07/2023 HPI Details Patient Name: Date of Service: NIX, ELZEY 04/07/2023 1:15 PM Medical Record Number: [id number] Patient Account Number: [id number] Date of Birth/Sex: Treating RN: [birth year] (67 y.o. M) Primary Care Provider: Hillard Danker Other Clinician: Referring Provider: Treating Provider/Extender: Shara Blazing in Treatment: 5 History of Present Illness HPI Description: ADMISSION 07/29/2018 Mr. Lisonbee is a 67 year old man with either prediabetes or diabetes he is on glipizide. In late October to November 2019 he noted a scabbed area on the back of his right calf. He picked this off a few times but it would not heal. He saw his primary physician on 12/5. It was felt at that time that this may actually heal on its own with conservative management. The next visit was on 07/20/2018 noted the area was a lot worse and arranged for his treatment here. He has been topical antibiotics like Neosporin although he stopped using this when the wound looked worse. He is just been covering this with a clean Band-Aid. He is given Bactrim a week ago and he is finishing this currently. It is made some improvement in the surrounding erythema per the patient. He does not have a history of nonhealing wounds. No prior history of wounds on his legs that he had difficulty healing. No prior skin issues. 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 Past medical history; A. fib status post ablation and recent cardioversion he is on Xarelto. He has a history of systolic heart failure, cervical radiculopathy, cardiomyopathy and type 2 diabetes as discussed ABI in the right leg was noncompressible on the right 2/20; the biopsy I did on the patient last week was negative for malignancy. Culture grew Pseudomonas and methicillin sensitive staph aureus. He is  on cefdinir 300 twice a day. I will have to make this a 10 day course. He put him in compression. He states that the redness pain and erythema are a lot better. I suspect the patient has chronic venous insufficiency probably with a secondary cellulitis 2/27; arrives today with copious amounts of drainage coming out of the wound irritating the skin below the wound area. He only renewed the final 3 days of cefdinir today 3/5; came in for a nurse change 3 days ago. Again a lot of drainage noted. Zinc oxide was applied unfortunately the drainage appears to a pool then he has a string of superficial open areas extending down into the Achilles area and some just below the wound. The wound itself does not look too bad. He is completed the antibiotics although apparently there was separation from the original 7 with the last 3 days. Culture grew a few MSSA and a few Pseudomonas 3/12; not too much difference over the last week. He came in for a dressing change on Monday by our nursing staff. Necrotic debris again over the wound surface. The entire area looks irritated but nontender and I do not think shows obvious evidence of infection. We have not heard anything about the reflux studies. Also notable than in this diabetic man we had noncompressible vessels and although I can feel his pulses easily in his feet I will order arterial studies as well. 3/19-Patient had experience more pain has been dressed with a silver alginate, the wounds all have necrotic debris, very friable with easy bleeding with any kind of surface debridement including with gauze and Anasept and with a #3 curette. His vascular  LIBORIO, CRAYCRAFT ([id number]) [id number] Page 1 of 9 Visit Report for 04/07/2023 HPI Details Patient Name: Date of Service: NIX, ELZEY 04/07/2023 1:15 PM Medical Record Number: [id number] Patient Account Number: [id number] Date of Birth/Sex: Treating RN: [birth year] (67 y.o. M) Primary Care Provider: Hillard Danker Other Clinician: Referring Provider: Treating Provider/Extender: Shara Blazing in Treatment: 5 History of Present Illness HPI Description: ADMISSION 07/29/2018 Mr. Lisonbee is a 67 year old man with either prediabetes or diabetes he is on glipizide. In late October to November 2019 he noted a scabbed area on the back of his right calf. He picked this off a few times but it would not heal. He saw his primary physician on 12/5. It was felt at that time that this may actually heal on its own with conservative management. The next visit was on 07/20/2018 noted the area was a lot worse and arranged for his treatment here. He has been topical antibiotics like Neosporin although he stopped using this when the wound looked worse. He is just been covering this with a clean Band-Aid. He is given Bactrim a week ago and he is finishing this currently. It is made some improvement in the surrounding erythema per the patient. He does not have a history of nonhealing wounds. No prior history of wounds on his legs that he had difficulty healing. No prior skin issues. 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 Past medical history; A. fib status post ablation and recent cardioversion he is on Xarelto. He has a history of systolic heart failure, cervical radiculopathy, cardiomyopathy and type 2 diabetes as discussed ABI in the right leg was noncompressible on the right 2/20; the biopsy I did on the patient last week was negative for malignancy. Culture grew Pseudomonas and methicillin sensitive staph aureus. He is  on cefdinir 300 twice a day. I will have to make this a 10 day course. He put him in compression. He states that the redness pain and erythema are a lot better. I suspect the patient has chronic venous insufficiency probably with a secondary cellulitis 2/27; arrives today with copious amounts of drainage coming out of the wound irritating the skin below the wound area. He only renewed the final 3 days of cefdinir today 3/5; came in for a nurse change 3 days ago. Again a lot of drainage noted. Zinc oxide was applied unfortunately the drainage appears to a pool then he has a string of superficial open areas extending down into the Achilles area and some just below the wound. The wound itself does not look too bad. He is completed the antibiotics although apparently there was separation from the original 7 with the last 3 days. Culture grew a few MSSA and a few Pseudomonas 3/12; not too much difference over the last week. He came in for a dressing change on Monday by our nursing staff. Necrotic debris again over the wound surface. The entire area looks irritated but nontender and I do not think shows obvious evidence of infection. We have not heard anything about the reflux studies. Also notable than in this diabetic man we had noncompressible vessels and although I can feel his pulses easily in his feet I will order arterial studies as well. 3/19-Patient had experience more pain has been dressed with a silver alginate, the wounds all have necrotic debris, very friable with easy bleeding with any kind of surface debridement including with gauze and Anasept and with a #3 curette. His vascular  LIBORIO, CRAYCRAFT ([id number]) [id number] Page 1 of 9 Visit Report for 04/07/2023 HPI Details Patient Name: Date of Service: NIX, ELZEY 04/07/2023 1:15 PM Medical Record Number: [id number] Patient Account Number: [id number] Date of Birth/Sex: Treating RN: [birth year] (67 y.o. M) Primary Care Provider: Hillard Danker Other Clinician: Referring Provider: Treating Provider/Extender: Shara Blazing in Treatment: 5 History of Present Illness HPI Description: ADMISSION 07/29/2018 Mr. Lisonbee is a 67 year old man with either prediabetes or diabetes he is on glipizide. In late October to November 2019 he noted a scabbed area on the back of his right calf. He picked this off a few times but it would not heal. He saw his primary physician on 12/5. It was felt at that time that this may actually heal on its own with conservative management. The next visit was on 07/20/2018 noted the area was a lot worse and arranged for his treatment here. He has been topical antibiotics like Neosporin although he stopped using this when the wound looked worse. He is just been covering this with a clean Band-Aid. He is given Bactrim a week ago and he is finishing this currently. It is made some improvement in the surrounding erythema per the patient. He does not have a history of nonhealing wounds. No prior history of wounds on his legs that he had difficulty healing. No prior skin issues. 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 Past medical history; A. fib status post ablation and recent cardioversion he is on Xarelto. He has a history of systolic heart failure, cervical radiculopathy, cardiomyopathy and type 2 diabetes as discussed ABI in the right leg was noncompressible on the right 2/20; the biopsy I did on the patient last week was negative for malignancy. Culture grew Pseudomonas and methicillin sensitive staph aureus. He is  on cefdinir 300 twice a day. I will have to make this a 10 day course. He put him in compression. He states that the redness pain and erythema are a lot better. I suspect the patient has chronic venous insufficiency probably with a secondary cellulitis 2/27; arrives today with copious amounts of drainage coming out of the wound irritating the skin below the wound area. He only renewed the final 3 days of cefdinir today 3/5; came in for a nurse change 3 days ago. Again a lot of drainage noted. Zinc oxide was applied unfortunately the drainage appears to a pool then he has a string of superficial open areas extending down into the Achilles area and some just below the wound. The wound itself does not look too bad. He is completed the antibiotics although apparently there was separation from the original 7 with the last 3 days. Culture grew a few MSSA and a few Pseudomonas 3/12; not too much difference over the last week. He came in for a dressing change on Monday by our nursing staff. Necrotic debris again over the wound surface. The entire area looks irritated but nontender and I do not think shows obvious evidence of infection. We have not heard anything about the reflux studies. Also notable than in this diabetic man we had noncompressible vessels and although I can feel his pulses easily in his feet I will order arterial studies as well. 3/19-Patient had experience more pain has been dressed with a silver alginate, the wounds all have necrotic debris, very friable with easy bleeding with any kind of surface debridement including with gauze and Anasept and with a #3 curette. His vascular  change. Cleanser:  Vashe 5.8 (oz) 1 x Per Week/15 Days Discharge Instructions: Cleanse the wound with Vashe prior to applying a clean dressing using gauze sponges, not tissue or cotton balls. Peri-Wound Care: Sween Lotion (Moisturizing lotion) 1 x Per Week/15 Days Discharge Instructions: Apply moisturizing lotion as directed Prim Dressing: Hydrofera Blue Ready Transfer Foam, 4x5 (in/in) 1 x Per Week/15 Days ary Discharge Instructions: Apply to wound bed as instructed Secondary Dressing: ABD Pad, 8x10 1 x Per Week/15 Days Discharge Instructions: Apply over primary dressing as directed. Secondary Dressing: Optifoam Non-Adhesive Dressing, 4x4 in 1 x Per Week/15 Days Discharge Instructions: Apply to ankle, achilles, lateral part of foot for protection Com pression Wrap: Urgo K2, (equivalent to a 4 layer) two layer compression system, regular 1 x Per Week/15 Days Discharge Instructions: Apply Urgo K2 as directed (alternative to 4 layer compression). 1. I change the primary dressing to Hydrofera Blue/ABDs/Urgo K2 compression 2. Although that dimension the posterior calf wound might have been larger there looks as though a lot of this is epithelialized. I did not see any reason for concern. No obvious infection Electronic Signature(s) Signed: 04/07/2023 4:45:38 PM By: Baltazar Najjar MD Entered By: Baltazar Najjar on 04/07/2023 14:14:13 -------------------------------------------------------------------------------- SuperBill Details Patient Name: Date of Service: [name] 04/07/2023 Medical Record Number: [id number] Patient Account Number: [id number] Date of Birth/Sex: Treating RN: 11-29-1955 (67 y.o. M) Primary Care Provider: Hillard Danker Other Clinician: Referring Provider: Treating Provider/Extender: Shara Blazing in Treatment: 5 Diagnosis Coding ICD-10 Codes Code Description [id number] Non-pressure chronic ulcer of other part of left lower leg with other specified  severity I87.332 Chronic venous hypertension (idiopathic) with ulcer and inflammation of left lower extremity E11.622 Type 2 diabetes mellitus with other skin ulcer Facility Procedures : CPT4 Code: 91478295 Description: (Facility Use Only) 705 [id number] 0675 - APPLY MULTLAY COMPRS LWR LT LEG Modifier: Quantity: 1 Physician Procedures : CPT4 Code Description Modifier [id number] 99213 - WC PHYS LEVEL 3 - EST PT ASHLIN, PREVOST J ([id number]) [id number] ICD-10 Diagnosis Description L97.828 Non-pressure chronic ulcer of other part of left lower leg with other  specified severity I87.332 Chronic venous hypertension (idiopathic) with ulcer and inflammation of left lower extremity E11.622 Type 2 diabetes mellitus with other skin ulcer Quantity: 1 Page 9 of 9 Electronic Signature(s) Signed: 04/07/2023 2:28:29 PM By: Fonnie Mu RN Signed: 04/07/2023 4:45:38 PM By: Baltazar Najjar MD Entered By: Fonnie Mu on 04/07/2023 29:51:88  change. Cleanser:  Vashe 5.8 (oz) 1 x Per Week/15 Days Discharge Instructions: Cleanse the wound with Vashe prior to applying a clean dressing using gauze sponges, not tissue or cotton balls. Peri-Wound Care: Sween Lotion (Moisturizing lotion) 1 x Per Week/15 Days Discharge Instructions: Apply moisturizing lotion as directed Prim Dressing: Hydrofera Blue Ready Transfer Foam, 4x5 (in/in) 1 x Per Week/15 Days ary Discharge Instructions: Apply to wound bed as instructed Secondary Dressing: ABD Pad, 8x10 1 x Per Week/15 Days Discharge Instructions: Apply over primary dressing as directed. Secondary Dressing: Optifoam Non-Adhesive Dressing, 4x4 in 1 x Per Week/15 Days Discharge Instructions: Apply to ankle, achilles, lateral part of foot for protection Com pression Wrap: Urgo K2, (equivalent to a 4 layer) two layer compression system, regular 1 x Per Week/15 Days Discharge Instructions: Apply Urgo K2 as directed (alternative to 4 layer compression). 1. I change the primary dressing to Hydrofera Blue/ABDs/Urgo K2 compression 2. Although that dimension the posterior calf wound might have been larger there looks as though a lot of this is epithelialized. I did not see any reason for concern. No obvious infection Electronic Signature(s) Signed: 04/07/2023 4:45:38 PM By: Baltazar Najjar MD Entered By: Baltazar Najjar on 04/07/2023 14:14:13 -------------------------------------------------------------------------------- SuperBill Details Patient Name: Date of Service: [name] 04/07/2023 Medical Record Number: [id number] Patient Account Number: [id number] Date of Birth/Sex: Treating RN: 11-29-1955 (67 y.o. M) Primary Care Provider: Hillard Danker Other Clinician: Referring Provider: Treating Provider/Extender: Shara Blazing in Treatment: 5 Diagnosis Coding ICD-10 Codes Code Description [id number] Non-pressure chronic ulcer of other part of left lower leg with other specified  severity I87.332 Chronic venous hypertension (idiopathic) with ulcer and inflammation of left lower extremity E11.622 Type 2 diabetes mellitus with other skin ulcer Facility Procedures : CPT4 Code: 91478295 Description: (Facility Use Only) [phone] - APPLY MULTLAY COMPRS LWR LT LEG Modifier: Quantity: 1 Physician Procedures : CPT4 Code Description Modifier [id number] 99213 - WC PHYS LEVEL 3 - EST PT ASHLIN, PREVOST J ([id number]) [id number] ICD-10 Diagnosis Description L97.828 Non-pressure chronic ulcer of other part of left lower leg with other  specified severity I87.332 Chronic venous hypertension (idiopathic) with ulcer and inflammation of left lower extremity E11.622 Type 2 diabetes mellitus with other skin ulcer Quantity: 1 Page 9 of 9 Electronic Signature(s) Signed: 04/07/2023 2:28:29 PM By: Fonnie Mu RN Signed: 04/07/2023 4:45:38 PM By: Baltazar Najjar MD Entered By: Fonnie Mu on 04/07/2023 29:51:88  cover (for example, large plastic bag) or a cast cover and may then take shower. Edema Control - Lymphedema / SCD / Other Left Lower Extremity Elevate legs to the level of the heart or above for 30 minutes daily and/or when sitting for 3-4 times a day throughout the day. Avoid standing for long periods of time. Exercise regularly - walking is good for circulation Wound Treatment Wound #13 - Lower Leg Wound Laterality: Left, Posterior Cleanser: Soap and Water 1 x Per Week/15 Days Discharge Instructions: May shower and wash wound with dial antibacterial soap and water prior to dressing change. Cleanser: Vashe 5.8 (oz) 1 x Per Week/15 Days Discharge Instructions: Cleanse the wound with Vashe prior to applying a clean dressing using gauze sponges, not tissue or cotton balls. Peri-Wound Care: Sween Lotion (Moisturizing lotion) 1 x Per Week/15 Days Discharge Instructions: Apply moisturizing lotion as directed Prim Dressing: Hydrofera Blue Ready Transfer Foam, 4x5 (in/in) 1 x Per Week/15 Days ary Discharge Instructions: Apply to wound bed as instructed Secondary Dressing: ABD Pad, 8x10 1 x Per Week/15 Days Discharge Instructions: Apply over primary dressing as directed. Secondary Dressing: Optifoam Non-Adhesive Dressing, 4x4 in 1 x Per Week/15 Days Discharge Instructions: Apply to ankle, achilles, lateral part of foot for protection Compression Wrap: Urgo K2, (equivalent to a 4 layer) two layer compression system, regular 1 x Per Week/15 Days Discharge Instructions: Apply Urgo K2 as directed (alternative to 4 layer compression). Electronic Signature(s) Signed: 04/07/2023 4:45:15 PM By: Fonnie Mu RN Signed: 04/07/2023 4:45:38 PM By: Baltazar Najjar MD Entered By: Fonnie Mu on 04/07/2023 14:09:10 -------------------------------------------------------------------------------- Problem List Details Patient Name: Date of Service: [name] 04/07/2023  1:15 PM Medical Record Number: [id number] Patient Account Number: [id number] Date of Birth/Sex: Treating RN: 1956/04/03 (67 y.o. M) Primary Care Provider: Hillard Danker Other Clinician: Referring Provider: Treating Provider/Extender: Shara Blazing in Treatment: 5 Active Problems ICD-10 Encounter Code Description Active Date MDM Diagnosis L97.828 Non-pressure chronic ulcer of other part of left lower leg with other specified 03/03/2023 No Yes severity I87.332 Chronic venous hypertension (idiopathic) with ulcer and inflammation of left 03/03/2023 No Yes lower extremity E11.622 Type 2 diabetes mellitus with other skin ulcer 03/03/2023 No Yes MAXIE, KLEMP ([id number]) [phone].pdf Page 5 of 9 Inactive Problems Resolved Problems Electronic Signature(s) Signed: 04/07/2023 4:45:38 PM By: Baltazar Najjar MD Entered By: Baltazar Najjar on 04/07/2023 14:11:12 -------------------------------------------------------------------------------- Progress Note Details Patient Name: Date of Service: [name]. 04/07/2023 1:15 PM Medical Record Number: [id number] Patient Account Number: [id number] Date of Birth/Sex: Treating RN: Sep 15, 1955 (67 y.o. M) Primary Care Provider: Hillard Danker Other Clinician: Referring Provider: Treating Provider/Extender: Shara Blazing in Treatment: 5 Subjective History of Present Illness (HPI) ADMISSION 07/29/2018 [name] is a 66 year old man with either prediabetes or diabetes he is on glipizide. In late October to November 2019 he noted a scabbed area on the back of his right calf. He picked this off a few times but it would not heal. He saw his primary physician on 12/5. It was felt at that time that this may actually heal on its own with conservative management. The next visit was on 07/20/2018 noted the area was a lot worse and arranged for his treatment here.  He has been topical antibiotics like Neosporin although he stopped using this when the wound looked worse. He is just been covering this with a clean Band-Aid. He is given Bactrim a week ago and he is finishing this currently.  change. Cleanser:  Vashe 5.8 (oz) 1 x Per Week/15 Days Discharge Instructions: Cleanse the wound with Vashe prior to applying a clean dressing using gauze sponges, not tissue or cotton balls. Peri-Wound Care: Sween Lotion (Moisturizing lotion) 1 x Per Week/15 Days Discharge Instructions: Apply moisturizing lotion as directed Prim Dressing: Hydrofera Blue Ready Transfer Foam, 4x5 (in/in) 1 x Per Week/15 Days ary Discharge Instructions: Apply to wound bed as instructed Secondary Dressing: ABD Pad, 8x10 1 x Per Week/15 Days Discharge Instructions: Apply over primary dressing as directed. Secondary Dressing: Optifoam Non-Adhesive Dressing, 4x4 in 1 x Per Week/15 Days Discharge Instructions: Apply to ankle, achilles, lateral part of foot for protection Com pression Wrap: Urgo K2, (equivalent to a 4 layer) two layer compression system, regular 1 x Per Week/15 Days Discharge Instructions: Apply Urgo K2 as directed (alternative to 4 layer compression). 1. I change the primary dressing to Hydrofera Blue/ABDs/Urgo K2 compression 2. Although that dimension the posterior calf wound might have been larger there looks as though a lot of this is epithelialized. I did not see any reason for concern. No obvious infection Electronic Signature(s) Signed: 04/07/2023 4:45:38 PM By: Baltazar Najjar MD Entered By: Baltazar Najjar on 04/07/2023 14:14:13 -------------------------------------------------------------------------------- SuperBill Details Patient Name: Date of Service: [name] 04/07/2023 Medical Record Number: [id number] Patient Account Number: [id number] Date of Birth/Sex: Treating RN: 11-29-1955 (67 y.o. M) Primary Care Provider: Hillard Danker Other Clinician: Referring Provider: Treating Provider/Extender: Shara Blazing in Treatment: 5 Diagnosis Coding ICD-10 Codes Code Description [id number] Non-pressure chronic ulcer of other part of left lower leg with other specified  severity I87.332 Chronic venous hypertension (idiopathic) with ulcer and inflammation of left lower extremity E11.622 Type 2 diabetes mellitus with other skin ulcer Facility Procedures : CPT4 Code: 91478295 Description: (Facility Use Only) 705 498 0675 - APPLY MULTLAY COMPRS LWR LT LEG Modifier: Quantity: 1 Physician Procedures : CPT4 Code Description Modifier [id number] 99213 - WC PHYS LEVEL 3 - EST PT ASHLIN, PREVOST J ([id number]) [id number] ICD-10 Diagnosis Description L97.828 Non-pressure chronic ulcer of other part of left lower leg with other  specified severity I87.332 Chronic venous hypertension (idiopathic) with ulcer and inflammation of left lower extremity E11.622 Type 2 diabetes mellitus with other skin ulcer Quantity: 1 Page 9 of 9 Electronic Signature(s) Signed: 04/07/2023 2:28:29 PM By: Fonnie Mu RN Signed: 04/07/2023 4:45:38 PM By: Baltazar Najjar MD Entered By: Fonnie Mu on 04/07/2023 29:51:88  LIBORIO, CRAYCRAFT ([id number]) [id number] Page 1 of 9 Visit Report for 04/07/2023 HPI Details Patient Name: Date of Service: NIX, ELZEY 04/07/2023 1:15 PM Medical Record Number: [id number] Patient Account Number: [id number] Date of Birth/Sex: Treating RN: [birth year] (67 y.o. M) Primary Care Provider: Hillard Danker Other Clinician: Referring Provider: Treating Provider/Extender: Shara Blazing in Treatment: 5 History of Present Illness HPI Description: ADMISSION 07/29/2018 Mr. Lisonbee is a 67 year old man with either prediabetes or diabetes he is on glipizide. In late October to November 2019 he noted a scabbed area on the back of his right calf. He picked this off a few times but it would not heal. He saw his primary physician on 12/5. It was felt at that time that this may actually heal on its own with conservative management. The next visit was on 07/20/2018 noted the area was a lot worse and arranged for his treatment here. He has been topical antibiotics like Neosporin although he stopped using this when the wound looked worse. He is just been covering this with a clean Band-Aid. He is given Bactrim a week ago and he is finishing this currently. It is made some improvement in the surrounding erythema per the patient. He does not have a history of nonhealing wounds. No prior history of wounds on his legs that he had difficulty healing. No prior skin issues. 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 Past medical history; A. fib status post ablation and recent cardioversion he is on Xarelto. He has a history of systolic heart failure, cervical radiculopathy, cardiomyopathy and type 2 diabetes as discussed ABI in the right leg was noncompressible on the right 2/20; the biopsy I did on the patient last week was negative for malignancy. Culture grew Pseudomonas and methicillin sensitive staph aureus. He is  on cefdinir 300 twice a day. I will have to make this a 10 day course. He put him in compression. He states that the redness pain and erythema are a lot better. I suspect the patient has chronic venous insufficiency probably with a secondary cellulitis 2/27; arrives today with copious amounts of drainage coming out of the wound irritating the skin below the wound area. He only renewed the final 3 days of cefdinir today 3/5; came in for a nurse change 3 days ago. Again a lot of drainage noted. Zinc oxide was applied unfortunately the drainage appears to a pool then he has a string of superficial open areas extending down into the Achilles area and some just below the wound. The wound itself does not look too bad. He is completed the antibiotics although apparently there was separation from the original 7 with the last 3 days. Culture grew a few MSSA and a few Pseudomonas 3/12; not too much difference over the last week. He came in for a dressing change on Monday by our nursing staff. Necrotic debris again over the wound surface. The entire area looks irritated but nontender and I do not think shows obvious evidence of infection. We have not heard anything about the reflux studies. Also notable than in this diabetic man we had noncompressible vessels and although I can feel his pulses easily in his feet I will order arterial studies as well. 3/19-Patient had experience more pain has been dressed with a silver alginate, the wounds all have necrotic debris, very friable with easy bleeding with any kind of surface debridement including with gauze and Anasept and with a #3 curette. His vascular  change. Cleanser:  Vashe 5.8 (oz) 1 x Per Week/15 Days Discharge Instructions: Cleanse the wound with Vashe prior to applying a clean dressing using gauze sponges, not tissue or cotton balls. Peri-Wound Care: Sween Lotion (Moisturizing lotion) 1 x Per Week/15 Days Discharge Instructions: Apply moisturizing lotion as directed Prim Dressing: Hydrofera Blue Ready Transfer Foam, 4x5 (in/in) 1 x Per Week/15 Days ary Discharge Instructions: Apply to wound bed as instructed Secondary Dressing: ABD Pad, 8x10 1 x Per Week/15 Days Discharge Instructions: Apply over primary dressing as directed. Secondary Dressing: Optifoam Non-Adhesive Dressing, 4x4 in 1 x Per Week/15 Days Discharge Instructions: Apply to ankle, achilles, lateral part of foot for protection Com pression Wrap: Urgo K2, (equivalent to a 4 layer) two layer compression system, regular 1 x Per Week/15 Days Discharge Instructions: Apply Urgo K2 as directed (alternative to 4 layer compression). 1. I change the primary dressing to Hydrofera Blue/ABDs/Urgo K2 compression 2. Although that dimension the posterior calf wound might have been larger there looks as though a lot of this is epithelialized. I did not see any reason for concern. No obvious infection Electronic Signature(s) Signed: 04/07/2023 4:45:38 PM By: Baltazar Najjar MD Entered By: Baltazar Najjar on 04/07/2023 14:14:13 -------------------------------------------------------------------------------- SuperBill Details Patient Name: Date of Service: [name] 04/07/2023 Medical Record Number: [id number] Patient Account Number: [id number] Date of Birth/Sex: Treating RN: 11-29-1955 (67 y.o. M) Primary Care Provider: Hillard Danker Other Clinician: Referring Provider: Treating Provider/Extender: Shara Blazing in Treatment: 5 Diagnosis Coding ICD-10 Codes Code Description [id number] Non-pressure chronic ulcer of other part of left lower leg with other specified  severity I87.332 Chronic venous hypertension (idiopathic) with ulcer and inflammation of left lower extremity E11.622 Type 2 diabetes mellitus with other skin ulcer Facility Procedures : CPT4 Code: 91478295 Description: (Facility Use Only) [phone] - APPLY MULTLAY COMPRS LWR LT LEG Modifier: Quantity: 1 Physician Procedures : CPT4 Code Description Modifier [id number] - WC PHYS LEVEL 3 - EST PT ASHLIN, PREVOST J ([id number]) [id number] ICD-10 Diagnosis Description L97.828 Non-pressure chronic ulcer of other part of left lower leg with other  specified severity I87.332 Chronic venous hypertension (idiopathic) with ulcer and inflammation of left lower extremity E11.622 Type 2 diabetes mellitus with other skin ulcer Quantity: 1 Page 9 of 9 Electronic Signature(s) Signed: 04/07/2023 2:28:29 PM By: Fonnie Mu RN Signed: 04/07/2023 4:45:38 PM By: Baltazar Najjar MD Entered By: Fonnie Mu on 04/07/2023 29:51:88

## 2023-04-09 ENCOUNTER — Other Ambulatory Visit: Payer: Self-pay | Admitting: Internal Medicine

## 2023-04-09 DIAGNOSIS — E1169 Type 2 diabetes mellitus with other specified complication: Secondary | ICD-10-CM

## 2023-04-09 NOTE — Progress Notes (Signed)
Referring [name]: Treating [name]/Extender: Shara Blazing in Treatment: 5 Vital Signs Height(in): 73 Capillary Blood Glucose(mg/dl): 478 Weight(lbs): 295 Pulse(bpm): 71 Body Mass Index(BMI): 33.5 Blood Pressure(mmHg): 115/72 Temperature(F): 98.3 Respiratory Rate(breaths/min): 18 [13:Photos:] [N/A:N/A] Left, Posterior Lower Leg N/A N/A Wound Location: Blister N/A N/A Wounding Event: Venous Leg Ulcer N/A N/A Primary Etiology: Diabetic Wound/Ulcer of the Lower N/A N/A Secondary Etiology: Extremity Asthma, Sleep Apnea, Arrhythmia, N/A N/A Comorbid History: Congestive Heart Failure, Hypertension, Type II Diabetes 01/20/2023 N/A N/A Date Acquired: 5 N/A N/A Weeks of Treatment: Open N/A N/A Wound Status: No N/A N/A Wound Recurrence: 4x3.4x0.1 N/A N/A Measurements L x W x D (cm) 10.681 N/A N/A A (cm) : rea 1.068 N/A N/A Volume (cm) : 45.10% N/A N/A % Reduction in Area: 72.50% N/A N/A % Reduction in  Volume: Full Thickness Without Exposed N/A N/A Classification: Support Structures Medium N/A N/A Exudate Amount: Serosanguineous N/A N/A Exudate Type: red, brown N/A N/A Exudate Color: Distinct, outline attached N/A N/A Wound Margin: Large (67-100%) N/A N/A Granulation Amount: Red, Friable N/A N/A Granulation Quality: None Present (0%) N/A N/A Necrotic Amount: Fat Layer (Subcutaneous Tissue): Yes N/A N/A Exposed Structures: Fascia: No Tendon: No Muscle: No Joint: No Bone: No Small (1-33%) N/A N/A Epithelialization: Excoriation: No N/A N/A Periwound Skin Texture: Induration: No Callus: No Crepitus: No Rash: No Scarring: No Maceration: No N/A N/A Periwound Skin Moisture: Dry/Scaly: No Hemosiderin Staining: Yes N/A N/A Periwound Skin Color: Atrophie Blanche: No Cyanosis: No Ecchymosis: No Erythema: No Mottled: No Pallor: No EGE, MAVITY ([id number]) [id number] Page 4 of 7 Rubor: No Compression Therapy N/A N/A Procedures Performed: Treatment Notes Electronic Signature(s) Signed: 04/07/2023 4:45:38 PM By: [name] Najjar MD Entered By: [name] on 04/07/2023 11:11:20 -------------------------------------------------------------------------------- Multi-Disciplinary Care Plan Details Patient Name: Date of Service: [name], [name] 04/07/2023 1:15 PM Medical Record Number: [id number] Patient Account Number: [id number] Date of Birth/Sex: Treating RN: Nov 10, 1955 (67 y.o. [name], [name] Primary Care [name]: Hillard Danker Other Clinician: Referring [name]: Treating [name]/Extender: Shara Blazing in Treatment: 5 Active Inactive Wound/Skin Impairment Nursing Diagnoses: Impaired tissue integrity Knowledge deficit related to ulceration/compromised skin integrity Goals: Patient/caregiver will verbalize understanding of skin care regimen Date Initiated: 03/03/2023 Target  Resolution Date: 05/15/2023 Goal Status: Active Ulcer/skin breakdown will have a volume reduction of 30% by week 4 Date Initiated: 03/03/2023 Target Resolution Date: 04/18/2023 Goal Status: Active Interventions: Assess patient/caregiver ability to obtain necessary supplies Assess patient/caregiver ability to perform ulcer/skin care regimen upon admission and as needed Assess ulceration(s) every visit Provide education on ulcer and skin care Notes: Electronic Signature(s) Signed: 04/07/2023 1:39:32 PM By: [name] Mu RN Entered By: [name] on 04/07/2023 10:39:32 -------------------------------------------------------------------------------- Pain Assessment Details Patient Name: Date of Service: [name] Coco. 04/07/2023 1:15 PM Medical Record Number: [id number] Patient Account Number: [id number] Date of Birth/Sex: Treating RN: 1956-03-14 (67 y.o. M) Primary Care [name]: Hillard Danker Other Clinician: Referring Tyshon Fanning: Treating [name]/Extender: Shara Blazing in Treatment: [address] [name], [name] ([id number]) [id number] Page 5 of 7 Location of Pain Severity and Description of Pain Patient Has Paino No Site Locations Pain Management and Medication Current Pain Management: Electronic Signature(s) Signed: 04/09/2023 4:59:00 PM By: [name] Entered By: [name] on 04/07/2023 10:27:20 -------------------------------------------------------------------------------- Patient/Caregiver Education Details Patient Name: Date of Service: [name] Coco 10/22/2024andnbsp1:15 PM Medical Record Number: [id number] Patient Account Number: [id number] Date of Birth/Gender: Treating RN: 1956-06-07 (67 y.o. [name] Primary Care Physician:  Referring [name]: Treating [name]/Extender: Shara Blazing in Treatment: 5 Vital Signs Height(in): 73 Capillary Blood Glucose(mg/dl): 478 Weight(lbs): 295 Pulse(bpm): 71 Body Mass Index(BMI): 33.5 Blood Pressure(mmHg): 115/72 Temperature(F): 98.3 Respiratory Rate(breaths/min): 18 [13:Photos:] [N/A:N/A] Left, Posterior Lower Leg N/A N/A Wound Location: Blister N/A N/A Wounding Event: Venous Leg Ulcer N/A N/A Primary Etiology: Diabetic Wound/Ulcer of the Lower N/A N/A Secondary Etiology: Extremity Asthma, Sleep Apnea, Arrhythmia, N/A N/A Comorbid History: Congestive Heart Failure, Hypertension, Type II Diabetes 01/20/2023 N/A N/A Date Acquired: 5 N/A N/A Weeks of Treatment: Open N/A N/A Wound Status: No N/A N/A Wound Recurrence: 4x3.4x0.1 N/A N/A Measurements L x W x D (cm) 10.681 N/A N/A A (cm) : rea 1.068 N/A N/A Volume (cm) : 45.10% N/A N/A % Reduction in Area: 72.50% N/A N/A % Reduction in  Volume: Full Thickness Without Exposed N/A N/A Classification: Support Structures Medium N/A N/A Exudate Amount: Serosanguineous N/A N/A Exudate Type: red, brown N/A N/A Exudate Color: Distinct, outline attached N/A N/A Wound Margin: Large (67-100%) N/A N/A Granulation Amount: Red, Friable N/A N/A Granulation Quality: None Present (0%) N/A N/A Necrotic Amount: Fat Layer (Subcutaneous Tissue): Yes N/A N/A Exposed Structures: Fascia: No Tendon: No Muscle: No Joint: No Bone: No Small (1-33%) N/A N/A Epithelialization: Excoriation: No N/A N/A Periwound Skin Texture: Induration: No Callus: No Crepitus: No Rash: No Scarring: No Maceration: No N/A N/A Periwound Skin Moisture: Dry/Scaly: No Hemosiderin Staining: Yes N/A N/A Periwound Skin Color: Atrophie Blanche: No Cyanosis: No Ecchymosis: No Erythema: No Mottled: No Pallor: No EGE, MAVITY ([id number]) [id number] Page 4 of 7 Rubor: No Compression Therapy N/A N/A Procedures Performed: Treatment Notes Electronic Signature(s) Signed: 04/07/2023 4:45:38 PM By: [name] Najjar MD Entered By: [name] on 04/07/2023 11:11:20 -------------------------------------------------------------------------------- Multi-Disciplinary Care Plan Details Patient Name: Date of Service: [name], [name] 04/07/2023 1:15 PM Medical Record Number: [id number] Patient Account Number: [id number] Date of Birth/Sex: Treating RN: Nov 10, 1955 (67 y.o. [name], [name] Primary Care [name]: Hillard Danker Other Clinician: Referring [name]: Treating [name]/Extender: Shara Blazing in Treatment: 5 Active Inactive Wound/Skin Impairment Nursing Diagnoses: Impaired tissue integrity Knowledge deficit related to ulceration/compromised skin integrity Goals: Patient/caregiver will verbalize understanding of skin care regimen Date Initiated: 03/03/2023 Target  Resolution Date: 05/15/2023 Goal Status: Active Ulcer/skin breakdown will have a volume reduction of 30% by week 4 Date Initiated: 03/03/2023 Target Resolution Date: 04/18/2023 Goal Status: Active Interventions: Assess patient/caregiver ability to obtain necessary supplies Assess patient/caregiver ability to perform ulcer/skin care regimen upon admission and as needed Assess ulceration(s) every visit Provide education on ulcer and skin care Notes: Electronic Signature(s) Signed: 04/07/2023 1:39:32 PM By: [name] Mu RN Entered By: [name] on 04/07/2023 10:39:32 -------------------------------------------------------------------------------- Pain Assessment Details Patient Name: Date of Service: [name] Coco. 04/07/2023 1:15 PM Medical Record Number: [id number] Patient Account Number: [id number] Date of Birth/Sex: Treating RN: 1956-03-14 (67 y.o. M) Primary Care [name]: Hillard Danker Other Clinician: Referring Tyshon Fanning: Treating [name]/Extender: Shara Blazing in Treatment: [address] [name], [name] ([id number]) [id number] Page 5 of 7 Location of Pain Severity and Description of Pain Patient Has Paino No Site Locations Pain Management and Medication Current Pain Management: Electronic Signature(s) Signed: 04/09/2023 4:59:00 PM By: [name] Entered By: [name] on 04/07/2023 10:27:20 -------------------------------------------------------------------------------- Patient/Caregiver Education Details Patient Name: Date of Service: [name] Coco 10/22/2024andnbsp1:15 PM Medical Record Number: [id number] Patient Account Number: [id number] Date of Birth/Gender: Treating RN: 1956-06-07 (67 y.o. [name] Primary Care Physician:  Referring [name]: Treating [name]/Extender: Shara Blazing in Treatment: 5 Vital Signs Height(in): 73 Capillary Blood Glucose(mg/dl): 478 Weight(lbs): 295 Pulse(bpm): 71 Body Mass Index(BMI): 33.5 Blood Pressure(mmHg): 115/72 Temperature(F): 98.3 Respiratory Rate(breaths/min): 18 [13:Photos:] [N/A:N/A] Left, Posterior Lower Leg N/A N/A Wound Location: Blister N/A N/A Wounding Event: Venous Leg Ulcer N/A N/A Primary Etiology: Diabetic Wound/Ulcer of the Lower N/A N/A Secondary Etiology: Extremity Asthma, Sleep Apnea, Arrhythmia, N/A N/A Comorbid History: Congestive Heart Failure, Hypertension, Type II Diabetes 01/20/2023 N/A N/A Date Acquired: 5 N/A N/A Weeks of Treatment: Open N/A N/A Wound Status: No N/A N/A Wound Recurrence: 4x3.4x0.1 N/A N/A Measurements L x W x D (cm) 10.681 N/A N/A A (cm) : rea 1.068 N/A N/A Volume (cm) : 45.10% N/A N/A % Reduction in Area: 72.50% N/A N/A % Reduction in  Volume: Full Thickness Without Exposed N/A N/A Classification: Support Structures Medium N/A N/A Exudate Amount: Serosanguineous N/A N/A Exudate Type: red, brown N/A N/A Exudate Color: Distinct, outline attached N/A N/A Wound Margin: Large (67-100%) N/A N/A Granulation Amount: Red, Friable N/A N/A Granulation Quality: None Present (0%) N/A N/A Necrotic Amount: Fat Layer (Subcutaneous Tissue): Yes N/A N/A Exposed Structures: Fascia: No Tendon: No Muscle: No Joint: No Bone: No Small (1-33%) N/A N/A Epithelialization: Excoriation: No N/A N/A Periwound Skin Texture: Induration: No Callus: No Crepitus: No Rash: No Scarring: No Maceration: No N/A N/A Periwound Skin Moisture: Dry/Scaly: No Hemosiderin Staining: Yes N/A N/A Periwound Skin Color: Atrophie Blanche: No Cyanosis: No Ecchymosis: No Erythema: No Mottled: No Pallor: No EGE, MAVITY ([id number]) [id number] Page 4 of 7 Rubor: No Compression Therapy N/A N/A Procedures Performed: Treatment Notes Electronic Signature(s) Signed: 04/07/2023 4:45:38 PM By: [name] Najjar MD Entered By: [name] on 04/07/2023 11:11:20 -------------------------------------------------------------------------------- Multi-Disciplinary Care Plan Details Patient Name: Date of Service: [name], [name] 04/07/2023 1:15 PM Medical Record Number: [id number] Patient Account Number: [id number] Date of Birth/Sex: Treating RN: Nov 10, 1955 (67 y.o. [name], [name] Primary Care [name]: Hillard Danker Other Clinician: Referring [name]: Treating [name]/Extender: Shara Blazing in Treatment: 5 Active Inactive Wound/Skin Impairment Nursing Diagnoses: Impaired tissue integrity Knowledge deficit related to ulceration/compromised skin integrity Goals: Patient/caregiver will verbalize understanding of skin care regimen Date Initiated: 03/03/2023 Target  Resolution Date: 05/15/2023 Goal Status: Active Ulcer/skin breakdown will have a volume reduction of 30% by week 4 Date Initiated: 03/03/2023 Target Resolution Date: 04/18/2023 Goal Status: Active Interventions: Assess patient/caregiver ability to obtain necessary supplies Assess patient/caregiver ability to perform ulcer/skin care regimen upon admission and as needed Assess ulceration(s) every visit Provide education on ulcer and skin care Notes: Electronic Signature(s) Signed: 04/07/2023 1:39:32 PM By: [name] Mu RN Entered By: [name] on 04/07/2023 10:39:32 -------------------------------------------------------------------------------- Pain Assessment Details Patient Name: Date of Service: [name] Coco. 04/07/2023 1:15 PM Medical Record Number: [id number] Patient Account Number: [id number] Date of Birth/Sex: Treating RN: 1956-03-14 (67 y.o. M) Primary Care [name]: Hillard Danker Other Clinician: Referring Tyshon Fanning: Treating [name]/Extender: Shara Blazing in Treatment: [address] [name], [name] ([id number]) [id number] Page 5 of 7 Location of Pain Severity and Description of Pain Patient Has Paino No Site Locations Pain Management and Medication Current Pain Management: Electronic Signature(s) Signed: 04/09/2023 4:59:00 PM By: [name] Entered By: [name] on 04/07/2023 10:27:20 -------------------------------------------------------------------------------- Patient/Caregiver Education Details Patient Name: Date of Service: [name] Coco 10/22/2024andnbsp1:15 PM Medical Record Number: [id number] Patient Account Number: [id number] Date of Birth/Gender: Treating RN: 1956-06-07 (67 y.o. [name] Primary Care Physician:  SHANNEN, TOBEN ([id number]) [id number] Page 1 of 7 Visit Report for 04/07/2023 Arrival Information Details Patient Name: Date of Service: [name], [name] 04/07/2023 1:15 PM Medical Record Number: [id number] Patient Account Number: [id number] Date of Birth/Sex: Treating RN: 1955/12/11 (67 y.o. M) Primary Care Devri Kreher: Hillard Danker Other Clinician: Referring Tatiyanna Lashley: Treating Mayumi Summerson/Extender: Shara Blazing in Treatment: 5 Visit Information History Since Last Visit Added or deleted any medications: No Patient Arrived: Ambulatory Any new allergies or adverse reactions: No Arrival Time: 13:14 Had a fall or experienced change in No Accompanied By: self activities of daily living that may affect Transfer Assistance: None risk of falls: Patient Identification Verified: Yes Signs or symptoms of abuse/neglect since last visito No Secondary Verification Process Completed: Yes Hospitalized since last visit: No Patient Requires Transmission-Based Precautions: No Implantable device outside of the clinic excluding No Patient Has Alerts: Yes cellular tissue based products placed in the center Patient Alerts: Patient on Blood Thinner since last visit: Has Dressing in Place as Prescribed: Yes Has Compression in Place as Prescribed: Yes Pain Present Now: No Electronic Signature(s) Signed: 04/09/2023 4:59:00 PM By: [name] Entered By: [name] on 04/07/2023 10:17:59 -------------------------------------------------------------------------------- Compression Therapy Details Patient Name: Date of Service: [name] Coco. 04/07/2023 1:15 PM Medical Record Number: [id number] Patient Account Number: [id number] Date of Birth/Sex: Treating RN: 1955/06/24 (67 y.o. [name], [name] Primary Care Kitana Gage: Hillard Danker Other Clinician: Referring Margerie Fraiser: Treating Dezmen Alcock/Extender: Shara Blazing in Treatment: 5 Compression Therapy Performed for Wound Assessment: Wound #13 Left,Posterior Lower Leg Performed By: Clinician [name] Mu, RN Compression Type: Four Layer Post Procedure Diagnosis Same as Pre-procedure Electronic Signature(s) Signed: 04/07/2023 4:45:15 PM By: [name] Mu RN Entered By: [name] on 04/07/2023 11:10:13 [name] ([id number]) [id number] Page 2 of 7 -------------------------------------------------------------------------------- Encounter Discharge Information Details Patient Name: Date of Service: [name], [name] 04/07/2023 1:15 PM Medical Record Number: [id number] Patient Account Number: [id number] Date of Birth/Sex: Treating RN: 1956-03-27 (67 y.o. [name], [name] Primary Care Maly Lemarr: Hillard Danker Other Clinician: Referring Kimmerly Lora: Treating Dona Klemann/Extender: Shara Blazing in Treatment: 5 Encounter Discharge Information Items Discharge Condition: Stable Ambulatory Status: Ambulatory Discharge Destination: Home Transportation: Private Auto Accompanied By: self Schedule Follow-up Appointment: Yes Clinical Summary of Care: Patient Declined Electronic Signature(s) Signed: 04/07/2023 2:28:59 PM By: [name] Mu RN Entered By: [name] on 04/07/2023 11:28:58 -------------------------------------------------------------------------------- Lower Extremity Assessment Details Patient Name: Date of Service: [name], LOWERS. 04/07/2023 1:15 PM Medical Record Number: [id number] Patient Account Number: [id number] Date of Birth/Sex: Treating RN: [birth year] (67 y.o. M) Primary Care Aili Casillas: Hillard Danker Other Clinician: Referring Tobenna Needs: Treating Amr Sturtevant/Extender: Shara Blazing in Treatment: 5 Edema Assessment Assessed: [Left: No] [Right: No] Edema: [Left: N] [Right: o] Calf Left:  Right: Point of Measurement: 33 cm From Medial Instep 39.6 cm Ankle Left: Right: Point of Measurement: 12 cm From Medial Instep 23 cm Vascular Assessment Extremity colors, hair growth, and conditions: Extremity Color: [Left:Hyperpigmented] Hair Growth on Extremity: [Left:No] Temperature of Extremity: [Left:Warm] Capillary Refill: [Left:< 3 seconds] Dependent Rubor: [Left:No Yes] Electronic Signature(s) Signed: 04/09/2023 4:59:00 PM By: [name] Entered By: [name] on 04/07/2023 10:29:56 [name] ([id number]) [id number] Page 3 of 7 -------------------------------------------------------------------------------- Multi Wound Chart Details Patient Name: Date of Service: [name], [name] 04/07/2023 1:15 PM Medical Record Number: [id number] Patient Account Number: [id number] Date of Birth/Sex: Treating RN: 1955/08/27 (67 y.o. M) Primary Care Bijal Siglin: Hillard Danker Other Clinician:

## 2023-04-10 ENCOUNTER — Other Ambulatory Visit (HOSPITAL_COMMUNITY): Payer: Self-pay | Admitting: Cardiology

## 2023-04-14 ENCOUNTER — Encounter (HOSPITAL_BASED_OUTPATIENT_CLINIC_OR_DEPARTMENT_OTHER): Payer: Medicare HMO | Admitting: Internal Medicine

## 2023-04-14 DIAGNOSIS — I872 Venous insufficiency (chronic) (peripheral): Secondary | ICD-10-CM | POA: Diagnosis not present

## 2023-04-14 DIAGNOSIS — L97328 Non-pressure chronic ulcer of left ankle with other specified severity: Secondary | ICD-10-CM | POA: Diagnosis not present

## 2023-04-14 DIAGNOSIS — I428 Other cardiomyopathies: Secondary | ICD-10-CM | POA: Diagnosis not present

## 2023-04-14 DIAGNOSIS — S90812A Abrasion, left foot, initial encounter: Secondary | ICD-10-CM | POA: Diagnosis not present

## 2023-04-14 DIAGNOSIS — E875 Hyperkalemia: Secondary | ICD-10-CM | POA: Diagnosis not present

## 2023-04-14 DIAGNOSIS — I110 Hypertensive heart disease with heart failure: Secondary | ICD-10-CM | POA: Diagnosis not present

## 2023-04-14 DIAGNOSIS — L97222 Non-pressure chronic ulcer of left calf with fat layer exposed: Secondary | ICD-10-CM | POA: Diagnosis not present

## 2023-04-14 DIAGNOSIS — L97828 Non-pressure chronic ulcer of other part of left lower leg with other specified severity: Secondary | ICD-10-CM | POA: Diagnosis not present

## 2023-04-14 DIAGNOSIS — I480 Paroxysmal atrial fibrillation: Secondary | ICD-10-CM | POA: Diagnosis not present

## 2023-04-14 DIAGNOSIS — E11622 Type 2 diabetes mellitus with other skin ulcer: Secondary | ICD-10-CM | POA: Diagnosis not present

## 2023-04-14 DIAGNOSIS — I87332 Chronic venous hypertension (idiopathic) with ulcer and inflammation of left lower extremity: Secondary | ICD-10-CM | POA: Diagnosis not present

## 2023-04-15 NOTE — Progress Notes (Signed)
leg was involved in that study or not we will need to review that. His past medical history includes type type 2 diabetes, chronic venous insufficiency with secondary lymphedema, intermittent A-fib, nonischemic cardiomyopathy, thrombus of the left  atrial appendage, hereditary hemochromatosis cytosis. He is now on Coumadin presumably for the atrial fibrillation. JAMIYL, FLIGHT ([id number]) [id number] Page 3 of 9 ABIs in this clinic were not repeated 9/24; posterior left calf wound. We used Hydrofera Blue under compression. The wound looks better this week better looking surface. 10/1. Posterior left calf wound secondary to venous insufficiency. He was new to the clinic in 2 weeks ago at which time I thought he was going to require a difficult set of mechanical debridements however the wound surface is continuing to improve using Hydrofera Blue although the dimensions not so remarkably improved today 10/8; posterior left calf wound secondary to chronic venous insufficiency. He continues to think nice progress here on this wound the wound is smaller better looking surface. He arrived in clinic today with an abrasion on his Achilles area on the left leg as well as what looked to be a potential deep tissue injury on the lateral lateral part of the left foot at the level of the base of the fifth metatarsal. 10/15 posterior left calf wound continues to improve. We have been using Prisma under ABDs under Urgo K2 lite's. The areas on the Achilles and the left lateral foot which were concerning last time of maintain skin integrity. He is not wearing stockings even on the right leg I talked to him about this. 10/22; posterior left calf wound measures slightly larger today. The abrasion injury in the Achilles send on the lateral fifth metatarsal base look improved. We have been using Prisma under compression I am going to change this to [facility] today 10/29; posterior left calf wound continues to progress nicely. Presumably an abrasion injury on the Achilles heel. I changed dressing to Hydrofera Blue. Will need to dress the area on the Achilles today. The area on the fifth metatarsal base is not open Electronic  Signature(s) Signed: 04/14/2023 5:13:18 PM By: Baltazar Najjar MD Entered By: Baltazar Najjar on 04/14/2023 13:41:35 -------------------------------------------------------------------------------- Physical Exam Details Patient Name: Date of Service: [name]. 04/14/2023 1:15 PM Medical Record Number: [id number] Patient Account Number: [id number] Date of Birth/Sex: Treating RN: 18-Jan-1956 (67 y.o. M) Primary Care Provider: Hillard Danker Other Clinician: Referring Provider: Treating Provider/Extender: Shara Blazing in Treatment: 6 Constitutional Sitting or standing Blood Pressure is within target range for patient.. Pulse regular and within target range for patient.[name] Respirations regular, non-labored and within target range.. Temperature is normal and within the target range for the patient.[name] Appears in no distress. Notes Wound exam; left posterior calf clean granulated surface rims of epithelialization I see no problems here. The abrasion injury on the Achilles has opened fully. Dime sized open area here Electronic Signature(s) Signed: 04/14/2023 5:13:18 PM By: Baltazar Najjar MD Entered By: Baltazar Najjar on 04/14/2023 13:42:25 -------------------------------------------------------------------------------- Physician Orders Details Patient Name: Date of Service: [name] 04/14/2023 1:15 PM Medical Record Number: [id number] Patient Account Number: [id number] Date of Birth/Sex: Treating RN: May 31, 1956 (67 y.o. Lucious Groves Primary Care Provider: Hillard Danker Other Clinician: Referring Provider: Treating Provider/Extender: Shara Blazing in Treatment: 6 Verbal / Phone Orders: No Diagnosis Coding Follow-up Appointments ppointment in 1 week. - Tuesday 04/21/23 @ 2:30 w/ Dr. Leanord Hawking Return A Kirwin, [place] J ([id number]) [id number] Page 4 of  Record Number: [id number] Patient Account Number: [id number] Date of Birth/Sex: Treating RN: 04-Jun-1956 (67 y.o. M) Primary Care Provider: Hillard Danker Other Clinician: Referring Provider: Treating Provider/Extender: Shara Blazing in Treatment: 6 Subjective History of Present Illness (HPI) ADMISSION 07/29/2018 [name] is a 67 year old man with either prediabetes or diabetes he is on glipizide. In late October to November 2019 he noted a scabbed area on the back of his right calf. He picked this off a few times but it would not heal. He saw his primary physician on 12/5. It was felt at that time that this may actually heal on its own with conservative management. The next visit was on 07/20/2018 noted the area was a lot worse and arranged for his treatment here. He has been topical antibiotics like Neosporin although he stopped using this when the wound looked worse. He is just been covering this with a clean Band-Aid. He is given Bactrim a week ago and he is finishing this currently. It is made some improvement in the surrounding erythema per the patient. He does not have a history of nonhealing wounds. No prior history of wounds on his legs that he had difficulty healing. No prior skin issues. 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 Past medical history; A. fib status post ablation and recent cardioversion he is on Xarelto. He has a history of systolic heart failure, cervical radiculopathy, cardiomyopathy and type 2 diabetes as discussed ABI in the right leg was noncompressible on the right 2/20; the biopsy  I did on the patient last week was negative for malignancy. Culture grew Pseudomonas and methicillin sensitive staph aureus. He is on cefdinir 300 twice a day. I will have to make this a 10 day course. He put him in compression. He states that the redness pain and erythema are a lot better. I suspect the patient has chronic venous insufficiency probably with a secondary cellulitis 2/27; arrives today with copious amounts of drainage coming out of the wound irritating the skin below the wound area. He only renewed the final 3 days of cefdinir today 3/5; came in for a nurse change 3 days ago. Again a lot of drainage noted. Zinc oxide was applied unfortunately the drainage appears to a pool then he has a string of superficial open areas extending down into the Achilles area and some just below the wound. The wound itself does not look too bad. He is completed the antibiotics although apparently there was separation from the original 7 with the last 3 days. Culture grew a few MSSA and a few Pseudomonas 3/12; not too much difference over the last week. He came in for a dressing change on Monday by our nursing staff. Necrotic debris again over the wound surface. The entire area looks irritated but nontender and I do not think shows obvious evidence of infection. We have not heard anything about the reflux studies. Also notable than in this diabetic man we had noncompressible vessels and although I can feel his pulses easily in his feet I will order arterial studies KEBRON, SASSONE ([id number]) [id number] Page 6 of 9 as well. 3/19-Patient had experience more pain has been dressed with a silver alginate, the wounds all have necrotic debris, very friable with easy bleeding with any kind of surface debridement including with gauze and Anasept and with a #3 curette. His vascular appointments unfortunately were all canceled on account of the virus outbreak resulting in studies only  being done for  regular 1 x Per Week/15 Days Discharge Instructions: Apply Urgo K2 as directed (alternative to 4 layer compression). 1. We have to find the area on the left Achilles applying Hydrofera Blue to both wound areas to the original and the Achilles. 2. Padding this area. Still under full Urgo K2 compression. His edema control I noticed today is fairly marginal MARTISE, DEBNAM ([id number]) [id number] Page 9 of 9 Electronic Signature(s) Signed: 04/14/2023 5:13:18 PM By: Baltazar Najjar MD Entered By: Baltazar Najjar on 04/14/2023 13:43:32 -------------------------------------------------------------------------------- SuperBill Details Patient Name: Date of Service: [name]. 04/14/2023 Medical Record Number: [id number] Patient Account Number: [id number] Date of Birth/Sex: Treating RN: 17-Oct-1955 (67 y.o. [name], [name] Primary Care Provider: Hillard Danker Other Clinician: Referring Provider: Treating Provider/Extender: Shara Blazing in Treatment: 6 Diagnosis Coding ICD-10 Codes Code Description [phone] Non-pressure chronic ulcer of other part of left lower leg with other specified severity I87.332 Chronic venous hypertension (idiopathic) with ulcer and inflammation of left lower extremity L97.328 Non-pressure chronic ulcer of left ankle with other specified severity E11.622 Type 2 diabetes mellitus with other skin ulcer Facility Procedures : CPT4 Code: [id number] Description: (Facility Use Only) [phone] - APPLY MULTLAY COMPRS LWR LT LEG Modifier: Quantity: 1 Physician Procedures : CPT4 Code Description Modifier [id number] - WC PHYS LEVEL 3 - EST PT ICD-10 Diagnosis Description L97.828 Non-pressure chronic ulcer of other part of left lower leg with other specified severity I87.332 Chronic venous hypertension (idiopathic)  with  ulcer and inflammation of left lower extremity L97.328 Non-pressure chronic ulcer of left ankle with other specified severity Quantity: 1 Electronic Signature(s) Signed: 04/14/2023 5:13:18 PM By: Baltazar Najjar MD Entered By: Baltazar Najjar on 04/14/2023 13:44:12  leg was involved in that study or not we will need to review that. His past medical history includes type type 2 diabetes, chronic venous insufficiency with secondary lymphedema, intermittent A-fib, nonischemic cardiomyopathy, thrombus of the left  atrial appendage, hereditary hemochromatosis cytosis. He is now on Coumadin presumably for the atrial fibrillation. JAMIYL, FLIGHT ([id number]) [id number] Page 3 of 9 ABIs in this clinic were not repeated 9/24; posterior left calf wound. We used Hydrofera Blue under compression. The wound looks better this week better looking surface. 10/1. Posterior left calf wound secondary to venous insufficiency. He was new to the clinic in 2 weeks ago at which time I thought he was going to require a difficult set of mechanical debridements however the wound surface is continuing to improve using Hydrofera Blue although the dimensions not so remarkably improved today 10/8; posterior left calf wound secondary to chronic venous insufficiency. He continues to think nice progress here on this wound the wound is smaller better looking surface. He arrived in clinic today with an abrasion on his Achilles area on the left leg as well as what looked to be a potential deep tissue injury on the lateral lateral part of the left foot at the level of the base of the fifth metatarsal. 10/15 posterior left calf wound continues to improve. We have been using Prisma under ABDs under Urgo K2 lite's. The areas on the Achilles and the left lateral foot which were concerning last time of maintain skin integrity. He is not wearing stockings even on the right leg I talked to him about this. 10/22; posterior left calf wound measures slightly larger today. The abrasion injury in the Achilles send on the lateral fifth metatarsal base look improved. We have been using Prisma under compression I am going to change this to [facility] today 10/29; posterior left calf wound continues to progress nicely. Presumably an abrasion injury on the Achilles heel. I changed dressing to Hydrofera Blue. Will need to dress the area on the Achilles today. The area on the fifth metatarsal base is not open Electronic  Signature(s) Signed: 04/14/2023 5:13:18 PM By: Baltazar Najjar MD Entered By: Baltazar Najjar on 04/14/2023 13:41:35 -------------------------------------------------------------------------------- Physical Exam Details Patient Name: Date of Service: [name]. 04/14/2023 1:15 PM Medical Record Number: [id number] Patient Account Number: [id number] Date of Birth/Sex: Treating RN: 18-Jan-1956 (67 y.o. M) Primary Care Provider: Hillard Danker Other Clinician: Referring Provider: Treating Provider/Extender: Shara Blazing in Treatment: 6 Constitutional Sitting or standing Blood Pressure is within target range for patient.. Pulse regular and within target range for patient.[name] Respirations regular, non-labored and within target range.. Temperature is normal and within the target range for the patient.[name] Appears in no distress. Notes Wound exam; left posterior calf clean granulated surface rims of epithelialization I see no problems here. The abrasion injury on the Achilles has opened fully. Dime sized open area here Electronic Signature(s) Signed: 04/14/2023 5:13:18 PM By: Baltazar Najjar MD Entered By: Baltazar Najjar on 04/14/2023 13:42:25 -------------------------------------------------------------------------------- Physician Orders Details Patient Name: Date of Service: [name] 04/14/2023 1:15 PM Medical Record Number: [id number] Patient Account Number: [id number] Date of Birth/Sex: Treating RN: May 31, 1956 (67 y.o. Lucious Groves Primary Care Provider: Hillard Danker Other Clinician: Referring Provider: Treating Provider/Extender: Shara Blazing in Treatment: 6 Verbal / Phone Orders: No Diagnosis Coding Follow-up Appointments ppointment in 1 week. - Tuesday 04/21/23 @ 2:30 w/ Dr. Leanord Hawking Return A Kirwin, [place] J ([id number]) [id number] Page 4 of  leg was involved in that study or not we will need to review that. His past medical history includes type type 2 diabetes, chronic venous insufficiency with secondary lymphedema, intermittent A-fib, nonischemic cardiomyopathy, thrombus of the left  atrial appendage, hereditary hemochromatosis cytosis. He is now on Coumadin presumably for the atrial fibrillation. JAMIYL, FLIGHT ([id number]) [id number] Page 3 of 9 ABIs in this clinic were not repeated 9/24; posterior left calf wound. We used Hydrofera Blue under compression. The wound looks better this week better looking surface. 10/1. Posterior left calf wound secondary to venous insufficiency. He was new to the clinic in 2 weeks ago at which time I thought he was going to require a difficult set of mechanical debridements however the wound surface is continuing to improve using Hydrofera Blue although the dimensions not so remarkably improved today 10/8; posterior left calf wound secondary to chronic venous insufficiency. He continues to think nice progress here on this wound the wound is smaller better looking surface. He arrived in clinic today with an abrasion on his Achilles area on the left leg as well as what looked to be a potential deep tissue injury on the lateral lateral part of the left foot at the level of the base of the fifth metatarsal. 10/15 posterior left calf wound continues to improve. We have been using Prisma under ABDs under Urgo K2 lite's. The areas on the Achilles and the left lateral foot which were concerning last time of maintain skin integrity. He is not wearing stockings even on the right leg I talked to him about this. 10/22; posterior left calf wound measures slightly larger today. The abrasion injury in the Achilles send on the lateral fifth metatarsal base look improved. We have been using Prisma under compression I am going to change this to [facility] today 10/29; posterior left calf wound continues to progress nicely. Presumably an abrasion injury on the Achilles heel. I changed dressing to Hydrofera Blue. Will need to dress the area on the Achilles today. The area on the fifth metatarsal base is not open Electronic  Signature(s) Signed: 04/14/2023 5:13:18 PM By: Baltazar Najjar MD Entered By: Baltazar Najjar on 04/14/2023 13:41:35 -------------------------------------------------------------------------------- Physical Exam Details Patient Name: Date of Service: [name]. 04/14/2023 1:15 PM Medical Record Number: [id number] Patient Account Number: [id number] Date of Birth/Sex: Treating RN: 18-Jan-1956 (67 y.o. M) Primary Care Provider: Hillard Danker Other Clinician: Referring Provider: Treating Provider/Extender: Shara Blazing in Treatment: 6 Constitutional Sitting or standing Blood Pressure is within target range for patient.. Pulse regular and within target range for patient.[name] Respirations regular, non-labored and within target range.. Temperature is normal and within the target range for the patient.[name] Appears in no distress. Notes Wound exam; left posterior calf clean granulated surface rims of epithelialization I see no problems here. The abrasion injury on the Achilles has opened fully. Dime sized open area here Electronic Signature(s) Signed: 04/14/2023 5:13:18 PM By: Baltazar Najjar MD Entered By: Baltazar Najjar on 04/14/2023 13:42:25 -------------------------------------------------------------------------------- Physician Orders Details Patient Name: Date of Service: [name] 04/14/2023 1:15 PM Medical Record Number: [id number] Patient Account Number: [id number] Date of Birth/Sex: Treating RN: May 31, 1956 (67 y.o. Lucious Groves Primary Care Provider: Hillard Danker Other Clinician: Referring Provider: Treating Provider/Extender: Shara Blazing in Treatment: 6 Verbal / Phone Orders: No Diagnosis Coding Follow-up Appointments ppointment in 1 week. - Tuesday 04/21/23 @ 2:30 w/ Dr. Leanord Hawking Return A Kirwin, [place] J ([id number]) [id number] Page 4 of  leg was involved in that study or not we will need to review that. His past medical history includes type type 2 diabetes, chronic venous insufficiency with secondary lymphedema, intermittent A-fib, nonischemic cardiomyopathy, thrombus of the left  atrial appendage, hereditary hemochromatosis cytosis. He is now on Coumadin presumably for the atrial fibrillation. JAMIYL, FLIGHT ([id number]) [id number] Page 3 of 9 ABIs in this clinic were not repeated 9/24; posterior left calf wound. We used Hydrofera Blue under compression. The wound looks better this week better looking surface. 10/1. Posterior left calf wound secondary to venous insufficiency. He was new to the clinic in 2 weeks ago at which time I thought he was going to require a difficult set of mechanical debridements however the wound surface is continuing to improve using Hydrofera Blue although the dimensions not so remarkably improved today 10/8; posterior left calf wound secondary to chronic venous insufficiency. He continues to think nice progress here on this wound the wound is smaller better looking surface. He arrived in clinic today with an abrasion on his Achilles area on the left leg as well as what looked to be a potential deep tissue injury on the lateral lateral part of the left foot at the level of the base of the fifth metatarsal. 10/15 posterior left calf wound continues to improve. We have been using Prisma under ABDs under Urgo K2 lite's. The areas on the Achilles and the left lateral foot which were concerning last time of maintain skin integrity. He is not wearing stockings even on the right leg I talked to him about this. 10/22; posterior left calf wound measures slightly larger today. The abrasion injury in the Achilles send on the lateral fifth metatarsal base look improved. We have been using Prisma under compression I am going to change this to [facility] today 10/29; posterior left calf wound continues to progress nicely. Presumably an abrasion injury on the Achilles heel. I changed dressing to Hydrofera Blue. Will need to dress the area on the Achilles today. The area on the fifth metatarsal base is not open Electronic  Signature(s) Signed: 04/14/2023 5:13:18 PM By: Baltazar Najjar MD Entered By: Baltazar Najjar on 04/14/2023 13:41:35 -------------------------------------------------------------------------------- Physical Exam Details Patient Name: Date of Service: [name]. 04/14/2023 1:15 PM Medical Record Number: [id number] Patient Account Number: [id number] Date of Birth/Sex: Treating RN: 18-Jan-1956 (67 y.o. M) Primary Care Provider: Hillard Danker Other Clinician: Referring Provider: Treating Provider/Extender: Shara Blazing in Treatment: 6 Constitutional Sitting or standing Blood Pressure is within target range for patient.. Pulse regular and within target range for patient.[name] Respirations regular, non-labored and within target range.. Temperature is normal and within the target range for the patient.[name] Appears in no distress. Notes Wound exam; left posterior calf clean granulated surface rims of epithelialization I see no problems here. The abrasion injury on the Achilles has opened fully. Dime sized open area here Electronic Signature(s) Signed: 04/14/2023 5:13:18 PM By: Baltazar Najjar MD Entered By: Baltazar Najjar on 04/14/2023 13:42:25 -------------------------------------------------------------------------------- Physician Orders Details Patient Name: Date of Service: [name] 04/14/2023 1:15 PM Medical Record Number: [id number] Patient Account Number: [id number] Date of Birth/Sex: Treating RN: May 31, 1956 (67 y.o. Lucious Groves Primary Care Provider: Hillard Danker Other Clinician: Referring Provider: Treating Provider/Extender: Shara Blazing in Treatment: 6 Verbal / Phone Orders: No Diagnosis Coding Follow-up Appointments ppointment in 1 week. - Tuesday 04/21/23 @ 2:30 w/ Dr. Leanord Hawking Return A Kirwin, [place] J ([id number]) [id number] Page 4 of  leg was involved in that study or not we will need to review that. His past medical history includes type type 2 diabetes, chronic venous insufficiency with secondary lymphedema, intermittent A-fib, nonischemic cardiomyopathy, thrombus of the left  atrial appendage, hereditary hemochromatosis cytosis. He is now on Coumadin presumably for the atrial fibrillation. JAMIYL, FLIGHT ([id number]) [id number] Page 3 of 9 ABIs in this clinic were not repeated 9/24; posterior left calf wound. We used Hydrofera Blue under compression. The wound looks better this week better looking surface. 10/1. Posterior left calf wound secondary to venous insufficiency. He was new to the clinic in 2 weeks ago at which time I thought he was going to require a difficult set of mechanical debridements however the wound surface is continuing to improve using Hydrofera Blue although the dimensions not so remarkably improved today 10/8; posterior left calf wound secondary to chronic venous insufficiency. He continues to think nice progress here on this wound the wound is smaller better looking surface. He arrived in clinic today with an abrasion on his Achilles area on the left leg as well as what looked to be a potential deep tissue injury on the lateral lateral part of the left foot at the level of the base of the fifth metatarsal. 10/15 posterior left calf wound continues to improve. We have been using Prisma under ABDs under Urgo K2 lite's. The areas on the Achilles and the left lateral foot which were concerning last time of maintain skin integrity. He is not wearing stockings even on the right leg I talked to him about this. 10/22; posterior left calf wound measures slightly larger today. The abrasion injury in the Achilles send on the lateral fifth metatarsal base look improved. We have been using Prisma under compression I am going to change this to [facility] today 10/29; posterior left calf wound continues to progress nicely. Presumably an abrasion injury on the Achilles heel. I changed dressing to Hydrofera Blue. Will need to dress the area on the Achilles today. The area on the fifth metatarsal base is not open Electronic  Signature(s) Signed: 04/14/2023 5:13:18 PM By: Baltazar Najjar MD Entered By: Baltazar Najjar on 04/14/2023 13:41:35 -------------------------------------------------------------------------------- Physical Exam Details Patient Name: Date of Service: [name]. 04/14/2023 1:15 PM Medical Record Number: [id number] Patient Account Number: [id number] Date of Birth/Sex: Treating RN: 18-Jan-1956 (67 y.o. M) Primary Care Provider: Hillard Danker Other Clinician: Referring Provider: Treating Provider/Extender: Shara Blazing in Treatment: 6 Constitutional Sitting or standing Blood Pressure is within target range for patient.. Pulse regular and within target range for patient.[name] Respirations regular, non-labored and within target range.. Temperature is normal and within the target range for the patient.[name] Appears in no distress. Notes Wound exam; left posterior calf clean granulated surface rims of epithelialization I see no problems here. The abrasion injury on the Achilles has opened fully. Dime sized open area here Electronic Signature(s) Signed: 04/14/2023 5:13:18 PM By: Baltazar Najjar MD Entered By: Baltazar Najjar on 04/14/2023 13:42:25 -------------------------------------------------------------------------------- Physician Orders Details Patient Name: Date of Service: [name] 04/14/2023 1:15 PM Medical Record Number: [id number] Patient Account Number: [id number] Date of Birth/Sex: Treating RN: May 31, 1956 (67 y.o. Lucious Groves Primary Care Provider: Hillard Danker Other Clinician: Referring Provider: Treating Provider/Extender: Shara Blazing in Treatment: 6 Verbal / Phone Orders: No Diagnosis Coding Follow-up Appointments ppointment in 1 week. - Tuesday 04/21/23 @ 2:30 w/ Dr. Leanord Hawking Return A Kirwin, [place] J ([id number]) [id number] Page 4 of  9 Anesthetic (In  clinic) Topical Lidocaine 4% applied to wound bed Bathing/ Shower/ Hygiene May shower with protection but do not get wound dressing(s) wet. Protect dressing(s) with water repellant cover (for example, large plastic bag) or a cast cover and may then take shower. Wound Treatment Wound #13 - Lower Leg Wound Laterality: Left, Posterior Cleanser: Soap and Water 1 x Per Week/15 Days Discharge Instructions: May shower and wash wound with dial antibacterial soap and water prior to dressing change. Cleanser: Vashe 5.8 (oz) 1 x Per Week/15 Days Discharge Instructions: Cleanse the wound with Vashe prior to applying a clean dressing using gauze sponges, not tissue or cotton balls. Peri-Wound Care: Sween Lotion (Moisturizing lotion) 1 x Per Week/15 Days Discharge Instructions: Apply moisturizing lotion as directed Prim Dressing: Hydrofera Blue Ready Transfer Foam, 4x5 (in/in) 1 x Per Week/15 Days ary Discharge Instructions: Apply to wound bed as instructed Secondary Dressing: ABD Pad, 8x10 1 x Per Week/15 Days Discharge Instructions: Apply over primary dressing as directed. Secondary Dressing: Optifoam Non-Adhesive Dressing, 4x4 in 1 x Per Week/15 Days Discharge Instructions: Apply to ankle, achilles, lateral part of foot for protection Compression Wrap: Urgo K2, (equivalent to a 4 layer) two layer compression system, regular 1 x Per Week/15 Days Discharge Instructions: Apply Urgo K2 as directed (alternative to 4 layer compression). Wound #14 - Calcaneus Wound Laterality: Left Cleanser: Soap and Water 1 x Per Week/15 Days Discharge Instructions: May shower and wash wound with dial antibacterial soap and water prior to dressing change. Cleanser: Vashe 5.8 (oz) 1 x Per Week/15 Days Discharge Instructions: Cleanse the wound with Vashe prior to applying a clean dressing using gauze sponges, not tissue or cotton balls. Peri-Wound Care: Sween Lotion (Moisturizing lotion) 1 x Per Week/15 Days Discharge  Instructions: Apply moisturizing lotion as directed Prim Dressing: Hydrofera Blue Ready Transfer Foam, 4x5 (in/in) 1 x Per Week/15 Days ary Discharge Instructions: Apply to wound bed as instructed Secondary Dressing: ABD Pad, 8x10 1 x Per Week/15 Days Discharge Instructions: Apply over primary dressing as directed. Secondary Dressing: Optifoam Non-Adhesive Dressing, 4x4 in 1 x Per Week/15 Days Discharge Instructions: Apply to ankle, achilles, lateral part of foot for protection Compression Wrap: Urgo K2, (equivalent to a 4 layer) two layer compression system, regular 1 x Per Week/15 Days Discharge Instructions: Apply Urgo K2 as directed (alternative to 4 layer compression). Electronic Signature(s) Signed: 04/14/2023 4:23:12 PM By: Fonnie Mu RN Signed: 04/14/2023 5:13:18 PM By: Baltazar Najjar MD Entered By: Fonnie Mu on 04/14/2023 13:31:52 -------------------------------------------------------------------------------- Problem List Details Patient Name: Date of Service: JEVEN, MINERD 04/14/2023 1:15 PM Medical Record Number: [id number] Patient Account Number: [id number] Date of Birth/Sex: Treating RN: Jan 18, 1956 (68 y.o. M) Primary Care Provider: Hillard Danker Other Clinician: Referring Provider: Treating Provider/Extender: Shara Blazing in Treatment: [address] [place] J ([id number]) [id number] Page 5 of 9 Active Problems ICD-10 Encounter Code Description Active Date MDM Diagnosis L97.828 Non-pressure chronic ulcer of other part of left lower leg with other specified 03/03/2023 No Yes severity I87.332 Chronic venous hypertension (idiopathic) with ulcer and inflammation of left 03/03/2023 No Yes lower extremity L97.328 Non-pressure chronic ulcer of left ankle with other specified severity 04/14/2023 No Yes E11.622 Type 2 diabetes mellitus with other skin ulcer 03/03/2023 No Yes Inactive  Problems Resolved Problems Electronic Signature(s) Signed: 04/14/2023 5:13:18 PM By: Baltazar Najjar MD Entered By: Baltazar Najjar on 04/14/2023 13:38:48 -------------------------------------------------------------------------------- Progress Note Details Patient Name: Date of Service: [name], [name] 04/14/2023 1:15 PM Medical  regular 1 x Per Week/15 Days Discharge Instructions: Apply Urgo K2 as directed (alternative to 4 layer compression). 1. We have to find the area on the left Achilles applying Hydrofera Blue to both wound areas to the original and the Achilles. 2. Padding this area. Still under full Urgo K2 compression. His edema control I noticed today is fairly marginal MARTISE, DEBNAM ([id number]) [id number] Page 9 of 9 Electronic Signature(s) Signed: 04/14/2023 5:13:18 PM By: Baltazar Najjar MD Entered By: Baltazar Najjar on 04/14/2023 13:43:32 -------------------------------------------------------------------------------- SuperBill Details Patient Name: Date of Service: [name]. 04/14/2023 Medical Record Number: [id number] Patient Account Number: [id number] Date of Birth/Sex: Treating RN: 17-Oct-1955 (67 y.o. [name], [name] Primary Care Provider: Hillard Danker Other Clinician: Referring Provider: Treating Provider/Extender: Shara Blazing in Treatment: 6 Diagnosis Coding ICD-10 Codes Code Description [phone] Non-pressure chronic ulcer of other part of left lower leg with other specified severity I87.332 Chronic venous hypertension (idiopathic) with ulcer and inflammation of left lower extremity L97.328 Non-pressure chronic ulcer of left ankle with other specified severity E11.622 Type 2 diabetes mellitus with other skin ulcer Facility Procedures : CPT4 Code: 21308657 Description: (Facility Use Only) [phone] - APPLY MULTLAY COMPRS LWR LT LEG Modifier: Quantity: 1 Physician Procedures : CPT4 Code Description Modifier [id number] - WC PHYS LEVEL 3 - EST PT ICD-10 Diagnosis Description L97.828 Non-pressure chronic ulcer of other part of left lower leg with other specified severity I87.332 Chronic venous hypertension (idiopathic)  with  ulcer and inflammation of left lower extremity L97.328 Non-pressure chronic ulcer of left ankle with other specified severity Quantity: 1 Electronic Signature(s) Signed: 04/14/2023 5:13:18 PM By: Baltazar Najjar MD Entered By: Baltazar Najjar on 04/14/2023 13:44:12  regular 1 x Per Week/15 Days Discharge Instructions: Apply Urgo K2 as directed (alternative to 4 layer compression). 1. We have to find the area on the left Achilles applying Hydrofera Blue to both wound areas to the original and the Achilles. 2. Padding this area. Still under full Urgo K2 compression. His edema control I noticed today is fairly marginal MARTISE, DEBNAM ([id number]) [id number] Page 9 of 9 Electronic Signature(s) Signed: 04/14/2023 5:13:18 PM By: Baltazar Najjar MD Entered By: Baltazar Najjar on 04/14/2023 13:43:32 -------------------------------------------------------------------------------- SuperBill Details Patient Name: Date of Service: [name]. 04/14/2023 Medical Record Number: [id number] Patient Account Number: [id number] Date of Birth/Sex: Treating RN: 17-Oct-1955 (67 y.o. [name], [name] Primary Care Provider: Hillard Danker Other Clinician: Referring Provider: Treating Provider/Extender: Shara Blazing in Treatment: 6 Diagnosis Coding ICD-10 Codes Code Description [phone] Non-pressure chronic ulcer of other part of left lower leg with other specified severity I87.332 Chronic venous hypertension (idiopathic) with ulcer and inflammation of left lower extremity L97.328 Non-pressure chronic ulcer of left ankle with other specified severity E11.622 Type 2 diabetes mellitus with other skin ulcer Facility Procedures : CPT4 Code: 21308657 Description: (Facility Use Only) [phone] - APPLY MULTLAY COMPRS LWR LT LEG Modifier: Quantity: 1 Physician Procedures : CPT4 Code Description Modifier [id number] - WC PHYS LEVEL 3 - EST PT ICD-10 Diagnosis Description L97.828 Non-pressure chronic ulcer of other part of left lower leg with other specified severity I87.332 Chronic venous hypertension (idiopathic)  with  ulcer and inflammation of left lower extremity L97.328 Non-pressure chronic ulcer of left ankle with other specified severity Quantity: 1 Electronic Signature(s) Signed: 04/14/2023 5:13:18 PM By: Baltazar Najjar MD Entered By: Baltazar Najjar on 04/14/2023 13:44:12  leg was involved in that study or not we will need to review that. His past medical history includes type type 2 diabetes, chronic venous insufficiency with secondary lymphedema, intermittent A-fib, nonischemic cardiomyopathy, thrombus of the left  atrial appendage, hereditary hemochromatosis cytosis. He is now on Coumadin presumably for the atrial fibrillation. JAMIYL, FLIGHT ([id number]) [id number] Page 3 of 9 ABIs in this clinic were not repeated 9/24; posterior left calf wound. We used Hydrofera Blue under compression. The wound looks better this week better looking surface. 10/1. Posterior left calf wound secondary to venous insufficiency. He was new to the clinic in 2 weeks ago at which time I thought he was going to require a difficult set of mechanical debridements however the wound surface is continuing to improve using Hydrofera Blue although the dimensions not so remarkably improved today 10/8; posterior left calf wound secondary to chronic venous insufficiency. He continues to think nice progress here on this wound the wound is smaller better looking surface. He arrived in clinic today with an abrasion on his Achilles area on the left leg as well as what looked to be a potential deep tissue injury on the lateral lateral part of the left foot at the level of the base of the fifth metatarsal. 10/15 posterior left calf wound continues to improve. We have been using Prisma under ABDs under Urgo K2 lite's. The areas on the Achilles and the left lateral foot which were concerning last time of maintain skin integrity. He is not wearing stockings even on the right leg I talked to him about this. 10/22; posterior left calf wound measures slightly larger today. The abrasion injury in the Achilles send on the lateral fifth metatarsal base look improved. We have been using Prisma under compression I am going to change this to [facility] today 10/29; posterior left calf wound continues to progress nicely. Presumably an abrasion injury on the Achilles heel. I changed dressing to Hydrofera Blue. Will need to dress the area on the Achilles today. The area on the fifth metatarsal base is not open Electronic  Signature(s) Signed: 04/14/2023 5:13:18 PM By: Baltazar Najjar MD Entered By: Baltazar Najjar on 04/14/2023 13:41:35 -------------------------------------------------------------------------------- Physical Exam Details Patient Name: Date of Service: [name]. 04/14/2023 1:15 PM Medical Record Number: [id number] Patient Account Number: [id number] Date of Birth/Sex: Treating RN: 18-Jan-1956 (67 y.o. M) Primary Care Provider: Hillard Danker Other Clinician: Referring Provider: Treating Provider/Extender: Shara Blazing in Treatment: 6 Constitutional Sitting or standing Blood Pressure is within target range for patient.. Pulse regular and within target range for patient.[name] Respirations regular, non-labored and within target range.. Temperature is normal and within the target range for the patient.[name] Appears in no distress. Notes Wound exam; left posterior calf clean granulated surface rims of epithelialization I see no problems here. The abrasion injury on the Achilles has opened fully. Dime sized open area here Electronic Signature(s) Signed: 04/14/2023 5:13:18 PM By: Baltazar Najjar MD Entered By: Baltazar Najjar on 04/14/2023 13:42:25 -------------------------------------------------------------------------------- Physician Orders Details Patient Name: Date of Service: [name] 04/14/2023 1:15 PM Medical Record Number: [id number] Patient Account Number: [id number] Date of Birth/Sex: Treating RN: May 31, 1956 (67 y.o. Lucious Groves Primary Care Provider: Hillard Danker Other Clinician: Referring Provider: Treating Provider/Extender: Shara Blazing in Treatment: 6 Verbal / Phone Orders: No Diagnosis Coding Follow-up Appointments ppointment in 1 week. - Tuesday 04/21/23 @ 2:30 w/ Dr. Leanord Hawking Return A Kirwin, [place] J ([id number]) [id number] Page 4 of  ATIF, ZAPPONE ([id number]) [id number] Page 1 of 9 Visit Report for 04/14/2023 HPI Details Patient Name: Date of Service: GEAN, HYNES 04/14/2023 1:15 PM Medical Record Number: [id number] Patient Account Number: [id number] Date of Birth/Sex: Treating RN: 1955/10/21 (67 y.o. M) Primary Care Provider: Hillard Danker Other Clinician: Referring Provider: Treating Provider/Extender: Shara Blazing in Treatment: 6 History of Present Illness HPI Description: ADMISSION 07/29/2018 Mr. Lina is a 67 year old man with either prediabetes or diabetes he is on glipizide. In late October to November 2019 he noted a scabbed area on the back of his right calf. He picked this off a few times but it would not heal. He saw his primary physician on 12/5. It was felt at that time that this may actually heal on its own with conservative management. The next visit was on 07/20/2018 noted the area was a lot worse and arranged for his treatment here. He has been topical antibiotics like Neosporin although he stopped using this when the wound looked worse. He is just been covering this with a clean Band-Aid. He is given Bactrim a week ago and he is finishing this currently. It is made some improvement in the surrounding erythema per the patient. He does not have a history of nonhealing wounds. No prior history of wounds on his legs that he had difficulty healing. No prior skin issues. 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 Past medical history; A. fib status post ablation and recent cardioversion he is on Xarelto. He has a history of systolic heart failure, cervical radiculopathy, cardiomyopathy and type 2 diabetes as discussed ABI in the right leg was noncompressible on the right 2/20; the biopsy I did on the patient last week was negative for malignancy. Culture grew Pseudomonas and methicillin sensitive staph aureus. He is  on cefdinir 300 twice a day. I will have to make this a 10 day course. He put him in compression. He states that the redness pain and erythema are a lot better. I suspect the patient has chronic venous insufficiency probably with a secondary cellulitis 2/27; arrives today with copious amounts of drainage coming out of the wound irritating the skin below the wound area. He only renewed the final 3 days of cefdinir today 3/5; came in for a nurse change 3 days ago. Again a lot of drainage noted. Zinc oxide was applied unfortunately the drainage appears to a pool then he has a string of superficial open areas extending down into the Achilles area and some just below the wound. The wound itself does not look too bad. He is completed the antibiotics although apparently there was separation from the original 7 with the last 3 days. Culture grew a few MSSA and a few Pseudomonas 3/12; not too much difference over the last week. He came in for a dressing change on Monday by our nursing staff. Necrotic debris again over the wound surface. The entire area looks irritated but nontender and I do not think shows obvious evidence of infection. We have not heard anything about the reflux studies. Also notable than in this diabetic man we had noncompressible vessels and although I can feel his pulses easily in his feet I will order arterial studies as well. 3/19-Patient had experience more pain has been dressed with a silver alginate, the wounds all have necrotic debris, very friable with easy bleeding with any kind of surface debridement including with gauze and Anasept and with a #3 curette. His vascular  Record Number: [id number] Patient Account Number: [id number] Date of Birth/Sex: Treating RN: 04-Jun-1956 (67 y.o. M) Primary Care Provider: Hillard Danker Other Clinician: Referring Provider: Treating Provider/Extender: Shara Blazing in Treatment: 6 Subjective History of Present Illness (HPI) ADMISSION 07/29/2018 [name] is a 67 year old man with either prediabetes or diabetes he is on glipizide. In late October to November 2019 he noted a scabbed area on the back of his right calf. He picked this off a few times but it would not heal. He saw his primary physician on 12/5. It was felt at that time that this may actually heal on its own with conservative management. The next visit was on 07/20/2018 noted the area was a lot worse and arranged for his treatment here. He has been topical antibiotics like Neosporin although he stopped using this when the wound looked worse. He is just been covering this with a clean Band-Aid. He is given Bactrim a week ago and he is finishing this currently. It is made some improvement in the surrounding erythema per the patient. He does not have a history of nonhealing wounds. No prior history of wounds on his legs that he had difficulty healing. No prior skin issues. 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 Past medical history; A. fib status post ablation and recent cardioversion he is on Xarelto. He has a history of systolic heart failure, cervical radiculopathy, cardiomyopathy and type 2 diabetes as discussed ABI in the right leg was noncompressible on the right 2/20; the biopsy  I did on the patient last week was negative for malignancy. Culture grew Pseudomonas and methicillin sensitive staph aureus. He is on cefdinir 300 twice a day. I will have to make this a 10 day course. He put him in compression. He states that the redness pain and erythema are a lot better. I suspect the patient has chronic venous insufficiency probably with a secondary cellulitis 2/27; arrives today with copious amounts of drainage coming out of the wound irritating the skin below the wound area. He only renewed the final 3 days of cefdinir today 3/5; came in for a nurse change 3 days ago. Again a lot of drainage noted. Zinc oxide was applied unfortunately the drainage appears to a pool then he has a string of superficial open areas extending down into the Achilles area and some just below the wound. The wound itself does not look too bad. He is completed the antibiotics although apparently there was separation from the original 7 with the last 3 days. Culture grew a few MSSA and a few Pseudomonas 3/12; not too much difference over the last week. He came in for a dressing change on Monday by our nursing staff. Necrotic debris again over the wound surface. The entire area looks irritated but nontender and I do not think shows obvious evidence of infection. We have not heard anything about the reflux studies. Also notable than in this diabetic man we had noncompressible vessels and although I can feel his pulses easily in his feet I will order arterial studies KEBRON, SASSONE ([id number]) [id number] Page 6 of 9 as well. 3/19-Patient had experience more pain has been dressed with a silver alginate, the wounds all have necrotic debris, very friable with easy bleeding with any kind of surface debridement including with gauze and Anasept and with a #3 curette. His vascular appointments unfortunately were all canceled on account of the virus outbreak resulting in studies only  being done for  ATIF, ZAPPONE ([id number]) [id number] Page 1 of 9 Visit Report for 04/14/2023 HPI Details Patient Name: Date of Service: GEAN, HYNES 04/14/2023 1:15 PM Medical Record Number: [id number] Patient Account Number: [id number] Date of Birth/Sex: Treating RN: 1955/10/21 (67 y.o. M) Primary Care Provider: Hillard Danker Other Clinician: Referring Provider: Treating Provider/Extender: Shara Blazing in Treatment: 6 History of Present Illness HPI Description: ADMISSION 07/29/2018 Mr. Lina is a 67 year old man with either prediabetes or diabetes he is on glipizide. In late October to November 2019 he noted a scabbed area on the back of his right calf. He picked this off a few times but it would not heal. He saw his primary physician on 12/5. It was felt at that time that this may actually heal on its own with conservative management. The next visit was on 07/20/2018 noted the area was a lot worse and arranged for his treatment here. He has been topical antibiotics like Neosporin although he stopped using this when the wound looked worse. He is just been covering this with a clean Band-Aid. He is given Bactrim a week ago and he is finishing this currently. It is made some improvement in the surrounding erythema per the patient. He does not have a history of nonhealing wounds. No prior history of wounds on his legs that he had difficulty healing. No prior skin issues. 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 Past medical history; A. fib status post ablation and recent cardioversion he is on Xarelto. He has a history of systolic heart failure, cervical radiculopathy, cardiomyopathy and type 2 diabetes as discussed ABI in the right leg was noncompressible on the right 2/20; the biopsy I did on the patient last week was negative for malignancy. Culture grew Pseudomonas and methicillin sensitive staph aureus. He is  on cefdinir 300 twice a day. I will have to make this a 10 day course. He put him in compression. He states that the redness pain and erythema are a lot better. I suspect the patient has chronic venous insufficiency probably with a secondary cellulitis 2/27; arrives today with copious amounts of drainage coming out of the wound irritating the skin below the wound area. He only renewed the final 3 days of cefdinir today 3/5; came in for a nurse change 3 days ago. Again a lot of drainage noted. Zinc oxide was applied unfortunately the drainage appears to a pool then he has a string of superficial open areas extending down into the Achilles area and some just below the wound. The wound itself does not look too bad. He is completed the antibiotics although apparently there was separation from the original 7 with the last 3 days. Culture grew a few MSSA and a few Pseudomonas 3/12; not too much difference over the last week. He came in for a dressing change on Monday by our nursing staff. Necrotic debris again over the wound surface. The entire area looks irritated but nontender and I do not think shows obvious evidence of infection. We have not heard anything about the reflux studies. Also notable than in this diabetic man we had noncompressible vessels and although I can feel his pulses easily in his feet I will order arterial studies as well. 3/19-Patient had experience more pain has been dressed with a silver alginate, the wounds all have necrotic debris, very friable with easy bleeding with any kind of surface debridement including with gauze and Anasept and with a #3 curette. His vascular  leg was involved in that study or not we will need to review that. His past medical history includes type type 2 diabetes, chronic venous insufficiency with secondary lymphedema, intermittent A-fib, nonischemic cardiomyopathy, thrombus of the left  atrial appendage, hereditary hemochromatosis cytosis. He is now on Coumadin presumably for the atrial fibrillation. JAMIYL, FLIGHT ([id number]) [id number] Page 3 of 9 ABIs in this clinic were not repeated 9/24; posterior left calf wound. We used Hydrofera Blue under compression. The wound looks better this week better looking surface. 10/1. Posterior left calf wound secondary to venous insufficiency. He was new to the clinic in 2 weeks ago at which time I thought he was going to require a difficult set of mechanical debridements however the wound surface is continuing to improve using Hydrofera Blue although the dimensions not so remarkably improved today 10/8; posterior left calf wound secondary to chronic venous insufficiency. He continues to think nice progress here on this wound the wound is smaller better looking surface. He arrived in clinic today with an abrasion on his Achilles area on the left leg as well as what looked to be a potential deep tissue injury on the lateral lateral part of the left foot at the level of the base of the fifth metatarsal. 10/15 posterior left calf wound continues to improve. We have been using Prisma under ABDs under Urgo K2 lite's. The areas on the Achilles and the left lateral foot which were concerning last time of maintain skin integrity. He is not wearing stockings even on the right leg I talked to him about this. 10/22; posterior left calf wound measures slightly larger today. The abrasion injury in the Achilles send on the lateral fifth metatarsal base look improved. We have been using Prisma under compression I am going to change this to [facility] today 10/29; posterior left calf wound continues to progress nicely. Presumably an abrasion injury on the Achilles heel. I changed dressing to Hydrofera Blue. Will need to dress the area on the Achilles today. The area on the fifth metatarsal base is not open Electronic  Signature(s) Signed: 04/14/2023 5:13:18 PM By: Baltazar Najjar MD Entered By: Baltazar Najjar on 04/14/2023 13:41:35 -------------------------------------------------------------------------------- Physical Exam Details Patient Name: Date of Service: [name]. 04/14/2023 1:15 PM Medical Record Number: [id number] Patient Account Number: [id number] Date of Birth/Sex: Treating RN: 18-Jan-1956 (67 y.o. M) Primary Care Provider: Hillard Danker Other Clinician: Referring Provider: Treating Provider/Extender: Shara Blazing in Treatment: 6 Constitutional Sitting or standing Blood Pressure is within target range for patient.. Pulse regular and within target range for patient.[name] Respirations regular, non-labored and within target range.. Temperature is normal and within the target range for the patient.[name] Appears in no distress. Notes Wound exam; left posterior calf clean granulated surface rims of epithelialization I see no problems here. The abrasion injury on the Achilles has opened fully. Dime sized open area here Electronic Signature(s) Signed: 04/14/2023 5:13:18 PM By: Baltazar Najjar MD Entered By: Baltazar Najjar on 04/14/2023 13:42:25 -------------------------------------------------------------------------------- Physician Orders Details Patient Name: Date of Service: [name] 04/14/2023 1:15 PM Medical Record Number: [id number] Patient Account Number: [id number] Date of Birth/Sex: Treating RN: May 31, 1956 (67 y.o. Lucious Groves Primary Care Provider: Hillard Danker Other Clinician: Referring Provider: Treating Provider/Extender: Shara Blazing in Treatment: 6 Verbal / Phone Orders: No Diagnosis Coding Follow-up Appointments ppointment in 1 week. - Tuesday 04/21/23 @ 2:30 w/ Dr. Leanord Hawking Return A Kirwin, [place] J ([id number]) [id number] Page 4 of

## 2023-04-16 NOTE — Progress Notes (Signed)
achilles, lateral part of foot for protection Secured With Mart ([id number]) [id number] Page 8 of 8 Compression Wrap Urgo K2, (equivalent to a 4 layer) two layer compression system, regular Discharge Instruction: Apply Urgo K2 as directed (alternative to 4 layer compression). Compression Stockings Add-Ons Electronic Signature(s) Signed: 04/15/2023 4:40:46 PM By: [name] Entered By: [name] on 04/14/2023 10:24:08 -------------------------------------------------------------------------------- Vitals Details Patient Name: Date of Service: [name] NDY J. 04/14/2023 1:15 PM Medical Record Number: [id number] Patient Account Number: [id number] Date of Birth/Sex: Treating [name]: 09/01/1955 (67 y.o. M) Primary Care [name]: [name] Other Clinician: Referring [name]: Treating [name]/Extender: [name] in Treatment: 6 Vital Signs Time Taken: 13:03 Temperature (F): 97.8 Height (in): 73 Pulse (bpm): 71 Weight (lbs): 254 Respiratory Rate (breaths/min): 18 Body Mass Index (BMI): 33.5 Blood Pressure (mmHg): 111/69 Capillary  Blood Glucose (mg/dl): 664 Reference Range: 80 - 120 mg / dl Electronic Signature(s) Signed: 04/15/2023 4:40:46 PM By: [name] Entered By: [name] on 04/14/2023 10:03:29  achilles, lateral part of foot for protection Secured With Mart ([id number]) [id number] Page 8 of 8 Compression Wrap Urgo K2, (equivalent to a 4 layer) two layer compression system, regular Discharge Instruction: Apply Urgo K2 as directed (alternative to 4 layer compression). Compression Stockings Add-Ons Electronic Signature(s) Signed: 04/15/2023 4:40:46 PM By: [name] Entered By: [name] on 04/14/2023 10:24:08 -------------------------------------------------------------------------------- Vitals Details Patient Name: Date of Service: [name] NDY J. 04/14/2023 1:15 PM Medical Record Number: [id number] Patient Account Number: [id number] Date of Birth/Sex: Treating [name]: 09/01/1955 (67 y.o. M) Primary Care [name]: [name] Other Clinician: Referring [name]: Treating [name]/Extender: [name] in Treatment: 6 Vital Signs Time Taken: 13:03 Temperature (F): 97.8 Height (in): 73 Pulse (bpm): 71 Weight (lbs): 254 Respiratory Rate (breaths/min): 18 Body Mass Index (BMI): 33.5 Blood Pressure (mmHg): 111/69 Capillary  Blood Glucose (mg/dl): 664 Reference Range: 80 - 120 mg / dl Electronic Signature(s) Signed: 04/15/2023 4:40:46 PM By: [name] Entered By: [name] on 04/14/2023 10:03:29  [name] ([id number]) [id number] Page 1 of 8 Visit Report for 04/14/2023 Arrival Information Details Patient Name: Date of Service: [name] 04/14/2023 1:15 PM Medical Record Number: [id number] Patient Account Number: [id number] Date of Birth/Sex: Treating [name]: [birth year] (67 y.o. M) Primary Care Jaiyanna Safran: [name] Other Clinician: Referring [name]: Treating [name]/Extender: [name] in Treatment: 6 Visit Information History Since Last Visit Added or deleted any medications: No Patient Arrived: Ambulatory Any new allergies or adverse reactions: No Arrival Time: 13:01 Had a fall or experienced change in No Accompanied By: self activities of daily living that may affect Transfer Assistance: None risk of falls: Patient Identification Verified: Yes Signs or symptoms of abuse/neglect since last visito No Secondary Verification Process Completed: Yes Hospitalized since last visit: No Patient Requires Transmission-Based Precautions: No Implantable device outside of the clinic excluding No Patient Has Alerts: Yes cellular tissue based products placed in the center Patient Alerts: Patient on Blood Thinner since last visit: Has Dressing in Place as Prescribed: Yes Has Compression in Place as Prescribed: Yes Pain Present Now: Yes Electronic Signature(s) Signed: 04/15/2023 4:40:46 PM By: [name] Entered By: [name] on 04/14/2023 10:03:03 -------------------------------------------------------------------------------- Compression Therapy Details Patient Name: Date of Service: [name] Coco. 04/14/2023 1:15 PM Medical Record Number: [id number] Patient Account Number: [id number] Date of Birth/Sex: Treating [name]: 1955/09/29 (67 y.o. [name] Primary Care [name]: [name] Other Clinician: Referring [name]: Treating [name]/Extender: [name] in Treatment: 6 Compression Therapy Performed for Wound Assessment: Wound #13 Left,Posterior Lower Leg Performed By: Clinician [name], [name] Compression Type: Four Layer Post Procedure Diagnosis Same as Pre-procedure Electronic Signature(s) Signed: 04/14/2023 4:23:12 PM By: [name] [name] Entered By: [name] on 04/14/2023 10:32:26 [name] ([id number]) [id number] Page 2 of 8 -------------------------------------------------------------------------------- Compression Therapy Details Patient Name: Date of Service: [name] 04/14/2023 1:15 PM Medical Record Number: [id number] Patient Account Number: [id number] Date of Birth/Sex: Treating [name]: [birth year] (67 y.o. [name] Primary Care [name]: [name] Other Clinician: Referring [name]: Treating Belky Mundo/Extender: [name] in Treatment: 6 Compression Therapy Performed for Wound Assessment: Wound #14 Left Calcaneus Performed By: Clinician [name], [name] Compression Type: Four Layer Post Procedure Diagnosis Same as Pre-procedure Electronic Signature(s) Signed: 04/14/2023 4:23:12 PM By: [name] [name] Entered By: [name] on 04/14/2023 10:32:27 -------------------------------------------------------------------------------- Encounter Discharge Information Details Patient Name: Date of Service: [name] Coco. 04/14/2023 1:15 PM Medical Record Number: [id number] Patient Account Number: [id number] Date of Birth/Sex: Treating [name]: 07/23/1955 (67 y.o. [name] Primary Care Merion Grimaldo: [name] Other Clinician: Referring Beauregard Jarrells: Treating Omarrion Carmer/Extender: [name] in Treatment: 6 Encounter Discharge Information Items Discharge Condition: Stable Ambulatory Status: Ambulatory Discharge Destination: Home Transportation:  Private Auto Accompanied By: self Schedule Follow-up Appointment: Yes Clinical Summary of Care: Patient Declined Electronic Signature(s) Signed: 04/14/2023 4:23:12 PM By: [name] [name] Entered By: [name] on 04/14/2023 10:45:18 -------------------------------------------------------------------------------- Lower Extremity Assessment Details Patient Name: Date of Service: [name] Coco. 04/14/2023 1:15 PM Medical Record Number: [id number] Patient Account Number: [id number] Date of Birth/Sex: Treating [name]: 06-29-1955 (67 y.o. M) Primary Care Evah Rashid: [name] Other Clinician: Referring Braniyah Besse: Treating Ihan Pat/Extender: [name] in Treatment: 6 Edema Assessment Assessed: [Left: No] [Right: No] Edema: [Left: N] [Right: o] Calf [name] ([id number]) [id number] Page 3 of 8 Left: Right: Point of Measurement: 33 cm From Medial Instep 37.4 cm Ankle Left:  achilles, lateral part of foot for protection Secured With Mart ([id number]) [id number] Page 8 of 8 Compression Wrap Urgo K2, (equivalent to a 4 layer) two layer compression system, regular Discharge Instruction: Apply Urgo K2 as directed (alternative to 4 layer compression). Compression Stockings Add-Ons Electronic Signature(s) Signed: 04/15/2023 4:40:46 PM By: [name] Entered By: [name] on 04/14/2023 10:24:08 -------------------------------------------------------------------------------- Vitals Details Patient Name: Date of Service: [name] NDY J. 04/14/2023 1:15 PM Medical Record Number: [id number] Patient Account Number: [id number] Date of Birth/Sex: Treating [name]: 09/01/1955 (67 y.o. M) Primary Care [name]: [name] Other Clinician: Referring [name]: Treating [name]/Extender: [name] in Treatment: 6 Vital Signs Time Taken: 13:03 Temperature (F): 97.8 Height (in): 73 Pulse (bpm): 71 Weight (lbs): 254 Respiratory Rate (breaths/min): 18 Body Mass Index (BMI): 33.5 Blood Pressure (mmHg): 111/69 Capillary  Blood Glucose (mg/dl): 664 Reference Range: 80 - 120 mg / dl Electronic Signature(s) Signed: 04/15/2023 4:40:46 PM By: [name] Entered By: [name] on 04/14/2023 10:03:29

## 2023-04-21 ENCOUNTER — Encounter (HOSPITAL_BASED_OUTPATIENT_CLINIC_OR_DEPARTMENT_OTHER): Payer: Medicare HMO | Attending: General Surgery | Admitting: Internal Medicine

## 2023-04-21 DIAGNOSIS — I428 Other cardiomyopathies: Secondary | ICD-10-CM | POA: Diagnosis not present

## 2023-04-21 DIAGNOSIS — S90822A Blister (nonthermal), left foot, initial encounter: Secondary | ICD-10-CM | POA: Diagnosis not present

## 2023-04-21 DIAGNOSIS — L97818 Non-pressure chronic ulcer of other part of right lower leg with other specified severity: Secondary | ICD-10-CM | POA: Diagnosis not present

## 2023-04-21 DIAGNOSIS — L97328 Non-pressure chronic ulcer of left ankle with other specified severity: Secondary | ICD-10-CM | POA: Diagnosis not present

## 2023-04-21 DIAGNOSIS — I5040 Unspecified combined systolic (congestive) and diastolic (congestive) heart failure: Secondary | ICD-10-CM | POA: Diagnosis not present

## 2023-04-21 DIAGNOSIS — L97828 Non-pressure chronic ulcer of other part of left lower leg with other specified severity: Secondary | ICD-10-CM | POA: Diagnosis not present

## 2023-04-21 DIAGNOSIS — I480 Paroxysmal atrial fibrillation: Secondary | ICD-10-CM | POA: Diagnosis not present

## 2023-04-21 DIAGNOSIS — E11622 Type 2 diabetes mellitus with other skin ulcer: Secondary | ICD-10-CM | POA: Diagnosis not present

## 2023-04-21 DIAGNOSIS — Z7901 Long term (current) use of anticoagulants: Secondary | ICD-10-CM | POA: Insufficient documentation

## 2023-04-21 DIAGNOSIS — I87332 Chronic venous hypertension (idiopathic) with ulcer and inflammation of left lower extremity: Secondary | ICD-10-CM | POA: Insufficient documentation

## 2023-04-21 DIAGNOSIS — S80822A Blister (nonthermal), left lower leg, initial encounter: Secondary | ICD-10-CM | POA: Diagnosis not present

## 2023-04-22 NOTE — Progress Notes (Addendum)
TSUTOMU, PELLEY ([id number]) [id number] Page 1 of 9 Visit Report for 04/21/2023 HPI Details Patient Name: Date of Service: [name], [name] 04/21/2023 2:30 PM Medical Record Number: [id number] Patient Account Number: [id number] Date of Birth/Sex: Treating RN: 1956/04/12 (67 y.o. M) Primary Care Provider: Hillard Danker Other Clinician: Referring Provider: Treating Provider/Extender: Shara Blazing in Treatment: 7 History of Present Illness HPI Description: ADMISSION 07/29/2018 Mr. Spawn is a 67 year old man with either prediabetes or diabetes he is on glipizide. In late October to November 2019 he noted a scabbed area on the back of his right calf. He picked this off a few times but it would not heal. He saw his primary physician on 12/5. It was felt at that time that this may actually heal on its own with conservative management. The next visit was on 07/20/2018 noted the area was a lot worse and arranged for his treatment here. He has been topical antibiotics like Neosporin although he stopped using this when the wound looked worse. He is just been covering this with a clean Band-Aid. He is given Bactrim a week ago and he is finishing this currently. It is made some improvement in the surrounding erythema per the patient. He does not have a history of nonhealing wounds. No prior history of wounds on his legs that he had difficulty healing. No prior skin issues. 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 Past medical history; A. fib status post ablation and recent cardioversion he is on Xarelto. He has a history of systolic heart failure, cervical radiculopathy, cardiomyopathy and type 2 diabetes as discussed ABI in the right leg was noncompressible on the right 2/20; the biopsy I did on the patient last week was negative for malignancy. Culture grew Pseudomonas and methicillin sensitive staph aureus. He is on cefdinir  300 twice a day. I will have to make this a 10 day course. He put him in compression. He states that the redness pain and erythema are a lot better. I suspect the patient has chronic venous insufficiency probably with a secondary cellulitis 2/27; arrives today with copious amounts of drainage coming out of the wound irritating the skin below the wound area. He only renewed the final 3 days of cefdinir today 3/5; came in for a nurse change 3 days ago. Again a lot of drainage noted. Zinc oxide was applied unfortunately the drainage appears to a pool then he has a string of superficial open areas extending down into the Achilles area and some just below the wound. The wound itself does not look too bad. He is completed the antibiotics although apparently there was separation from the original 7 with the last 3 days. Culture grew a few MSSA and a few Pseudomonas 3/12; not too much difference over the last week. He came in for a dressing change on Monday by our nursing staff. Necrotic debris again over the wound surface. The entire area looks irritated but nontender and I do not think shows obvious evidence of infection. We have not heard anything about the reflux studies. Also notable than in this diabetic man we had noncompressible vessels and although I can feel his pulses easily in his feet I will order arterial studies as well. 3/19-Patient had experience more pain has been dressed with a silver alginate, the wounds all have necrotic debris, very friable with easy bleeding with any kind of surface debridement including with gauze and Anasept and with a #3 curette. His vascular  appointments unfortunately were all canceled on account of the virus outbreak resulting in studies only being done for emergent cases. His pulses are easily palpable in the lower extremity. He has open areas below the primary wound where he states the drainage which included purulent material also with some blood pooled and  caused breakdown. 3/26; the patient was changed to Santyl with [facility] backing last week. This was largely because the silver alginate was sticking to the wound. He is still having quite a bit of pain. He tells [place] that the reflux studies and arterial studies we had attempted to arrange through vein and vascular are not being booked until mid May. Culture that was done last week showed both Serratia moderate and a few methicillin sensitive staph aureus I started him on Bactrim DS 1 p.o. twice daily on 3/23 for 7 days. He is here for follow-up. 4/3; patient is on Santyl with Hydrofera Blue. Patient complains of pain. Our intake nurse is noted drainage and some odor. He has completed Bactrim recently for Serratia and a few methicillin sensitive staph aureus. After our staff push hard last week we were able to get venous studies as well as arterial studies. His arterial studies showed on the right great toe pressure of 0.86. On the left ABI at 1.47 TBI of 0.87. On both sides waveforms were triphasic VENOUS STUDIES showed reflux in the femoral vein, popliteal vein great saphenous vein at the saphenofemoral junction and great saphenous vein at the proximal thigh. Also noted to have age-indeterminate superficial vein thrombosis involving the small saphenous vein. Notable that the patient is already on Xarelto for atrial fibrillation. 4/9; the patient has been to see vascular surgery and reviewed by Dr. Durwin Nora. He was felt to have only minimal superficial venous reflux in the right great greater saphenous vein. For this reason he was not felt to be a candidate for laser ablation of the right greater saphenous vein. A wedge cushion was suggested to keep his leg elevated. He does have deep venous reflux on the right. Culture I did last time showed a combination of Serratia Pseudomonas and methicillin sensitive staph aureus. We have been using Hydrofera Blue to the large wound, Santyl to some of the deep  punched out satellite lesions distal to it. There is some improvement 4/16; the application for Apligraf was put out for further review for material that we have resubmitted. He has the deep area on the right posterior calf which is large and then 3 small punched out areas beneath this. In general his wounds look a lot better 4/23; the patient has his large original wound and then 2 punched out satellite lesions below it on the right posterior calf. I was usually able to use Apligraf #1 to cover the full surface area of the larger wound. We continued with Santyl and Hydrofera Blue on the 2 punched-out areas 5/7; patient has his large original wound and 2 punched out satellite lesions below it in the right posterior calf. Apligraf #2 today. Major improvement in the big wound in terms of wound depth. Punched-out [place] wounds have a very healthy looking wound bed. 5/21-Patient comes back for his large right posterior calf wound for which she has been an Apligraf #3 today. Wound depth seems to be better, the punched-out wounds appear to have healthy bed with some bleeding 6/4; right posterior calf wound is much better. Apligraf #4 today. The major wound has no wound depth at all. Only 1 of the satellite  lesions is still open. The patient has very high blood pressure coming in today with a diastolic blood pressure of 130 after lying there for a few minutes it was down to 110. He states that is running 100 210 diastolic at home. He was high last week as well I thought he was on 50 mg 1/2 tablet twice daily of losartan I told him to double up on that however it turns out he is actually on 50 twice daily. Course he is run out of his medications prematurely. He has a follow-up with his primary's office next Wednesday. PRAISE, ELSMORE ([id number]) [id number] Page 2 of 9 6/18; patient's wounds continue to improve. The small area laterally is just about closed and there is continued  epithelialization on the area on the posterior calf. 7/2; we applied his last Apligraf last visit. Early the next week there was a lot of purulent looking drainage that I cultured this grew staph and Pseudomonas however when we had him back for the next visit to 3 days later everything looked a lot better. He did not receive systemic antibiotics. Fortunately we did not have to remove the Apligraf. He has had heart trouble this week he was having an ablation apparently they have discovered a clot in his left atrium they have changed all his anticoagulants as well as his blood pressure medication. 7/16; using Hydrofera Blue. We are making nice progress towards closure. He has not yet ordered his compression stockings he has his measurements 7/23; dimensions are better. He has a new satellite area superiorly. Both wounds cauterized with silver nitrate. 7/30; absolutely no change in the major wound on the posterior part of the right calf. We have been using Hydrofera Blue for a prolonged period of time and really had a nice improvement but over the last few weeks this is stalled. There is no depth. He arrived in clinic today with a new area on the right anterior tibia which apparently was an "ingrown hair". It is clearly an open area. This looks like a wrap injury although he was not really aware of it. 8/31; since the patient was last in clinic he was admitted to hospital from 8/17 through 8/22. He had had multiple falls at home including one off a ladder. He was admitted to hospital with mild COVID-19 viral pneumonia. Noted to be hyperkalemic and in acute renal failure. He has chronic systolic heart failure with ejection fraction of 35%. He arrives back in clinic with the original wound on the posterior aspect of the right lower calf looking a lot better. He has eschared areas from falls and lacerations on his anterior knees bilaterally just below the patella and on the mid part of his tibia  bilaterally. He seems to have a small area on the right lateral ankle as well 9/10- Patient comes to clinic with a 2 new wounds one on the left anterior shin and one on the right posterior leg both the left knee and right knee areas are healed up. We have been using Santyl to the left anterior leg and silver alginate to right posterior leg wounds. 9/17; the patient is original wound looks good on the posterior right calf. He has traumatic areas on the left anterior shin left anterior patellar tendon and the right mid tibia area. Except for the right posterior calf all required debridement. We have been using Santyl on these areas and silver alginate posteriorly right calf 9/24; the original wound on the posterior calf on the  right looks good. This is healthy. There are traumatic wounds in the left anterior shin, left anterior patella in the right mid tibia area are about the same. He has dusky erythema on the left anterior tibia which led me to give him antibiotics last week. This does not look too much different. This is nontender and I wonder if this is all just venous inflammation. 10/2; the patient has 2 open areas on the right posterior calf both of these look good we have been using silver alginate. He has traumatic wounds on the left and right mid tibia areas. The surface of these wounds is still not ready for closure. We have been using Iodoflex. Finally has areas on the left knee. The latter wounds are all traumatic after a fall 10/9. His original wounds of the right posterior calf are superficial and progressing towards closure. His traumatic wounds on the left anterior tibia and right anterior tibia are considerably improved in terms of the wound surface. He asked me about a skin graft in these areas I do not think this is going to be necessary. Change from Iodosorb to [facility]. The area on the left knee is just about closed as well 10/16; his original wound on the right posterior  calf very superficial looks healthy. He has more recent traumatic wounds on the left anterior tibia and right anterior tibia both of these have better looking surfaces and measuring smaller. Might be beneficial for an advanced treatment product. The area on his left knee has a small superficial scab and we are going to close that when 10/23; his original wound areas on the right posterior calf continue to improve. The more recently traumatic wounds on the left anterior tibia and right anterior tibia both look better in terms of surfaces. No debridement required. We have been using Hydrofera Blue. He is approved for Apligraf 11/3; Apligraf #1 applied to the left anterior tibia right anterior tibia and to 2 small areas remaining on the right posterior calf which were part of his initial wound area. 11/19; Apligraf #2. Left anterior tibia is healed. Right anterior tibia much better. Only one small area remains on the right posterior calf which was part of his initial wound area 12/3; the left anterior tibia has opened up again. Hyper granulated nodule. Right anterior tibia is superficial and larger. There is no depth of this. I did not place Apligraf today return to [facility] under compression 12/10; the patient has 2 remaining wounds 1 on the right posterior and the other on the left anterior. Both of these look quite good. No debridement is required we have been using Hydrofera Blue under compression. 12/17; the patient has really not closed over which is disappointing. His wraps were very tight and there may be some wrap injury issues. On the left he has the original wound on the medial mid tibia he has a wrap injury on the lateral mid tibia. On the right his posterior areas are superficial but certainly not closed 12/31; the patient has totally closed on the left. He will transition to his own 20/30 below-knee stocking on the left leg today. Interestingly on the right posterior calf the 2  wounds that he had from last time of closed however there is an additional injury in this same area. Almost looks as though there is subcutaneous fluid/blistering. There is no evidence of cellulitis this does not seem tender 06/24/2019; the patient comes in having been placed in his 20/30 stockings on the  left leg last week. He states that he noted blisters break open on Saturday and they had opened into wounds on the anterior tibial area on the left by Monday. The area on the posterior right leg looks satisfactory we are still doing that and compression. We are using calcium alginate to all wound areas 1/14; the patient had juxta light however one of them was not the right size which will need to be returned. The new area last week on the left anterior tibia looks a lot better it measures smaller I would say the area on the right is about the same, this is on the right posterior calf. We have been using silver alginate 1/21; he has his bilateral juxta lites however the left anterior wound continues to look better where is the 2 areas on the right posterior calf look about the same. We have been using Hydrofera Blue under compression with the juxta lite stockings now on standby 2/4; the areas on the left are totally healed and he will be discharged into his own stocking/juxta light. On the right posterior calf he has what looks to be a blister with a small area of subdermal hemorrhage this is very tiny but I did I am going to wrap him today and see if this will be reabsorbed by next week 2/11; he comes in this week with expanded wounds on the posterior calf as well as a new wound on the left anterior. He said the left anterior started a few days ago with a blister that is now surface. He is using his juxta lite stockings at a pressure of 30/40 2/19; we wrapped his left leg last week after a failed attempt to discharge that leg and his juxta lites 2 weeks ago. The leg is again healed he has  superficial areas posteriorly on the right leg 2/26; the patient came in initially with right posterior calf wound secondary to minor trauma with secondary infection. He went on to develop a left lower leg wound. In the interim he had congestive heart failure with acute renal failure a fall with bilateral anterior leg wounds and finally Covid requiring admission to pneumonia with pneumonia. He is finally closed. He has external compression stockings bilaterally [juxta lite stockings]. His wounds are healed READMISSION 03/03/2023 This is a patient that we had here for a year from 20 20 through 20 21 with a sizable wound on the right posterior calf which was secondary to a combination of chronic venous insufficiency and secondary infection in the wound when he came here. This was a problematic wound in itself however he developed traumatic wounds on his lower extremities after a fall from a ladder during the same admission. Eventually he was healed out and discharged and juxta lite stockings. He tells me that everything was fine up until about 2-1/2 months ago he developed a blister and an open area on the left posterior calf this time. He saw his primary doctor and received a course of Bactrim. He has been using Silvadene with AandE ointment and covering this with gauze. He is here for review of this area. When he was here in 2021 he also saw Dr. Woodfin Ganja of vein and vascular he had venous reflux studies that showed deep venous reflux but nothing that was amenable to surgery. He also had arterial studies. He was noncompressible on the left but had normal TBIs and triphasic waveforms. In terms of the reflux studies I am not really sure whether the left  leg was involved in that study or not we will need to review that. His past medical history includes type type 2 diabetes, chronic venous insufficiency with secondary lymphedema, intermittent A-fib, nonischemic cardiomyopathy, thrombus of the left  atrial appendage, hereditary hemochromatosis cytosis. He is now on Coumadin presumably for the atrial fibrillation. IBROHIM, BUCCIERI ([id number]) [id number] Page 3 of 9 ABIs in this clinic were not repeated 9/24; posterior left calf wound. We used Hydrofera Blue under compression. The wound looks better this week better looking surface. 10/1. Posterior left calf wound secondary to venous insufficiency. He was new to the clinic in 2 weeks ago at which time I thought he was going to require a difficult set of mechanical debridements however the wound surface is continuing to improve using Hydrofera Blue although the dimensions not so remarkably improved today 10/8; posterior left calf wound secondary to chronic venous insufficiency. He continues to think nice progress here on this wound the wound is smaller better looking surface. He arrived in clinic today with an abrasion on his Achilles area on the left leg as well as what looked to be a potential deep tissue injury on the lateral lateral part of the left foot at the level of the base of the fifth metatarsal. 10/15 posterior left calf wound continues to improve. We have been using Prisma under ABDs under Urgo K2 lite's. The areas on the Achilles and the left lateral foot which were concerning last time of maintain skin integrity. He is not wearing stockings even on the right leg I talked to him about this. 10/22; posterior left calf wound measures slightly larger today. The abrasion injury in the Achilles send on the lateral fifth metatarsal base look improved. We have been using Prisma under compression I am going to change this to [facility] today 10/29; posterior left calf wound continues to progress nicely. Presumably an abrasion injury on the Achilles heel. I changed dressing to Hydrofera Blue. Will need to dress the area on the Achilles today. The area on the fifth metatarsal base is not open 11/5; posterior  left calf wound continues to improve nicely presumably an abrasion injury on the left Achilles also looks clean. He has a what looks to be an abrasion on the base of the fifth metatarsal it is not open but it looks as though there is been some friction on this area we will pad this today under the wrap. Patient with chronic venous insufficiency severe hemosiderin deposition and resultant wounds. He has been in the clinic before however that was for a wound on the right posterior calf. Notable he is not wearing stockings even on the uninvolved right leg today Electronic Signature(s) Signed: 04/21/2023 4:32:14 PM By: Baltazar Najjar MD Entered By: Baltazar Najjar on 04/21/2023 15:11:32 -------------------------------------------------------------------------------- Physical Exam Details Patient Name: Date of Service: [name] 04/21/2023 2:30 PM Medical Record Number: [id number] Patient Account Number: [id number] Date of Birth/Sex: Treating RN: [birth year] (67 y.o. M) Primary Care Provider: Hillard Danker Other Clinician: Referring Provider: Treating Provider/Extender: Shara Blazing in Treatment: 7 Constitutional Sitting or standing Blood Pressure is within target range for patient.. Pulse regular and within target range for patient.[name] Respirations regular, non-labored and within target range.. Temperature is normal and within the target range for the patient.[name] Appears in no distress. Cardiovascular . Notes Wound exam; left posterior calf wound healthy granulation advancing epithelialization. I see no problems here. The Achilles area looks a little wet and some surface debris I  did not debride this today but will do so next week if there is no improvement in wound dimensions. He has a small area on the left fifth metatarsal base. This is not open I suspect this is wrap injury we will pad this area Electronic Signature(s) Signed: 04/21/2023 4:32:14 PM By:  Baltazar Najjar MD Entered By: Baltazar Najjar on 04/21/2023 15:13:11 -------------------------------------------------------------------------------- Physician Orders Details Patient Name: Date of Service: [name] 04/21/2023 2:30 PM Medical Record Number: [id number] Patient Account Number: [id number] Date of Birth/Sex: Treating RN: 1955-10-12 (67 y.o. [name] Primary Care Provider: Hillard Danker Other Clinician: [name] ([id number]) [id number] Page 4 of 9 Referring Provider: Treating Provider/Extender: Shara Blazing in Treatment: 7 Verbal / Phone Orders: No Diagnosis Coding Follow-up Appointments ppointment in 1 week. - Tuesday 04/27/23 @ 1:45 w/ Dr. Leanord Hawking Return A Anesthetic (In clinic) Topical Lidocaine 4% applied to wound bed Bathing/ Shower/ Hygiene May shower with protection but do not get wound dressing(s) wet. Protect dressing(s) with water repellant cover (for example, large plastic bag) or a cast cover and may then take shower. Wound Treatment Wound #13 - Lower Leg Wound Laterality: Left, Posterior Cleanser: Soap and Water 1 x Per Week/15 Days Discharge Instructions: May shower and wash wound with dial antibacterial soap and water prior to dressing change. Cleanser: Vashe 5.8 (oz) 1 x Per Week/15 Days Discharge Instructions: Cleanse the wound with Vashe prior to applying a clean dressing using gauze sponges, not tissue or cotton balls. Peri-Wound Care: Sween Lotion (Moisturizing lotion) 1 x Per Week/15 Days Discharge Instructions: Apply moisturizing lotion as directed Prim Dressing: Hydrofera Blue Ready Transfer Foam, 4x5 (in/in) 1 x Per Week/15 Days ary Discharge Instructions: Apply to wound bed as instructed Secondary Dressing: ABD Pad, 8x10 1 x Per Week/15 Days Discharge Instructions: Apply over primary dressing as directed. Secondary Dressing: Optifoam Non-Adhesive Dressing,  4x4 in 1 x Per Week/15 Days Discharge Instructions: Apply to ankle, achilles, lateral part of foot for protection Compression Wrap: Urgo K2, (equivalent to a 4 layer) two layer compression system, regular 1 x Per Week/15 Days Discharge Instructions: Apply Urgo K2 as directed (alternative to 4 layer compression). Wound #14 - Calcaneus Wound Laterality: Left Cleanser: Soap and Water 1 x Per Week/15 Days Discharge Instructions: May shower and wash wound with dial antibacterial soap and water prior to dressing change. Cleanser: Vashe 5.8 (oz) 1 x Per Week/15 Days Discharge Instructions: Cleanse the wound with Vashe prior to applying a clean dressing using gauze sponges, not tissue or cotton balls. Peri-Wound Care: Sween Lotion (Moisturizing lotion) 1 x Per Week/15 Days Discharge Instructions: Apply moisturizing lotion as directed Prim Dressing: Hydrofera Blue Ready Transfer Foam, 4x5 (in/in) 1 x Per Week/15 Days ary Discharge Instructions: Apply to wound bed as instructed Secondary Dressing: ABD Pad, 8x10 1 x Per Week/15 Days Discharge Instructions: Apply over primary dressing as directed. Secondary Dressing: Optifoam Non-Adhesive Dressing, 4x4 in 1 x Per Week/15 Days Discharge Instructions: Apply to ankle, achilles, lateral part of foot for protection Compression Wrap: Urgo K2, (equivalent to a 4 layer) two layer compression system, regular 1 x Per Week/15 Days Discharge Instructions: Apply Urgo K2 as directed (alternative to 4 layer compression). Electronic Signature(s) Signed: 04/21/2023 4:19:11 PM By: Redmond Pulling RN, BSN Signed: 04/21/2023 4:32:14 PM By: Baltazar Najjar MD Entered By: Redmond Pulling on 04/21/2023 15:05:23 [name] ([id number]) [id number] Page 5 of 9 -------------------------------------------------------------------------------- Problem List Details Patient Name: Date of Service: [address], [name]  04/21/2023 2:30 PM Medical Record  Number: [id number] Patient Account Number: [id number] Date of Birth/Sex: Treating RN: 07/11/1955 (67 y.o. M) Primary Care Provider: Hillard Danker Other Clinician: Referring Provider: Treating Provider/Extender: Shara Blazing in Treatment: 7 Active Problems ICD-10 Encounter Code Description Active Date MDM Diagnosis L97.828 Non-pressure chronic ulcer of other part of left lower leg with other specified 03/03/2023 No Yes severity I87.332 Chronic venous hypertension (idiopathic) with ulcer and inflammation of left 03/03/2023 No Yes lower extremity L97.328 Non-pressure chronic ulcer of left ankle with other specified severity 04/14/2023 No Yes E11.622 Type 2 diabetes mellitus with other skin ulcer 03/03/2023 No Yes Inactive Problems Resolved Problems Electronic Signature(s) Signed: 04/21/2023 4:32:14 PM By: Baltazar Najjar MD Entered By: Baltazar Najjar on 04/21/2023 15:07:44 -------------------------------------------------------------------------------- Progress Note Details Patient Name: Date of Service: [name] 04/21/2023 2:30 PM Medical Record Number: [id number] Patient Account Number: [id number] Date of Birth/Sex: Treating RN: Jul 20, 1955 (67 y.o. M) Primary Care Provider: Hillard Danker Other Clinician: Referring Provider: Treating Provider/Extender: Shara Blazing in Treatment: 7 Subjective History of Present Illness (HPI) ADMISSION 07/29/2018 Mr. Wurm is a 67 year old man with either prediabetes or diabetes he is on glipizide. In late October to November 2019 he noted a scabbed area on the back of his right calf. He picked this off a few times but it would not heal. He saw his primary physician on 12/5. It was felt at that time that this may actually heal on its own with conservative management. The next visit was on 07/20/2018 noted the area was a lot worse and arranged for his treatment here. He has  been topical antibiotics like Neosporin although he stopped using this when the wound looked worse. He is just been covering this with a clean Band-Aid. He is given Bactrim a week ago and he is finishing this currently. It is made some improvement in the surrounding erythema per the patient. He does not have a history of nonhealing wounds. No prior history of wounds on his legs that he had difficulty healing. No prior skin issues. 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 Past medical history; A. fib status post ablation and recent cardioversion he is on Xarelto. He has a history of systolic heart failure, cervical radiculopathy, cardiomyopathy and type 2 diabetes as discussed ABI in the right leg was noncompressible on the right EITHEN, PRIER ([id number]) [id number] Page 6 of 9 2/20; the biopsy I did on the patient last week was negative for malignancy. Culture grew Pseudomonas and methicillin sensitive staph aureus. He is on cefdinir 300 twice a day. I will have to make this a 10 day course. He put him in compression. He states that the redness pain and erythema are a lot better. I suspect the patient has chronic venous insufficiency probably with a secondary cellulitis 2/27; arrives today with copious amounts of drainage coming out of the wound irritating the skin below the wound area. He only renewed the final 3 days of cefdinir today 3/5; came in for a nurse change 3 days ago. Again a lot of drainage noted. Zinc oxide was applied unfortunately the drainage appears to a pool then he has a string of superficial open areas extending down into the Achilles area and some just below the wound. The wound itself does not look too bad. He is completed the antibiotics although apparently there was separation from the original 7 with the last 3 days. Culture grew a few MSSA  and a few Pseudomonas 3/12; not too much difference over the last week. He came in for a  dressing change on Monday by our nursing staff. Necrotic debris again over the wound surface. The entire area looks irritated but nontender and I do not think shows obvious evidence of infection. We have not heard anything about the reflux studies. Also notable than in this diabetic man we had noncompressible vessels and although I can feel his pulses easily in his feet I will order arterial studies as well. 3/19-Patient had experience more pain has been dressed with a silver alginate, the wounds all have necrotic debris, very friable with easy bleeding with any kind of surface debridement including with gauze and Anasept and with a #3 curette. His vascular appointments unfortunately were all canceled on account of the virus outbreak resulting in studies only being done for emergent cases. His pulses are easily palpable in the lower extremity. He has open areas below the primary wound where he states the drainage which included purulent material also with some blood pooled and caused breakdown. 3/26; the patient was changed to Santyl with [facility] backing last week. This was largely because the silver alginate was sticking to the wound. He is still having quite a bit of pain. He tells [place] that the reflux studies and arterial studies we had attempted to arrange through vein and vascular are not being booked until mid May. Culture that was done last week showed both Serratia moderate and a few methicillin sensitive staph aureus I started him on Bactrim DS 1 p.o. twice daily on 3/23 for 7 days. He is here for follow-up. 4/3; patient is on Santyl with Hydrofera Blue. Patient complains of pain. Our intake nurse is noted drainage and some odor. He has completed Bactrim recently for Serratia and a few methicillin sensitive staph aureus. After our staff push hard last week we were able to get venous studies as well as arterial studies. His arterial studies showed on the right great toe pressure of 0.86.  On the left ABI at 1.47 TBI of 0.87. On both sides waveforms were triphasic VENOUS STUDIES showed reflux in the femoral vein, popliteal vein great saphenous vein at the saphenofemoral junction and great saphenous vein at the proximal thigh. Also noted to have age-indeterminate superficial vein thrombosis involving the small saphenous vein. Notable that the patient is already on Xarelto for atrial fibrillation. 4/9; the patient has been to see vascular surgery and reviewed by Dr. Durwin Nora. He was felt to have only minimal superficial venous reflux in the right great greater saphenous vein. For this reason he was not felt to be a candidate for laser ablation of the right greater saphenous vein. A wedge cushion was suggested to keep his leg elevated. He does have deep venous reflux on the right. Culture I did last time showed a combination of Serratia Pseudomonas and methicillin sensitive staph aureus. We have been using Hydrofera Blue to the large wound, Santyl to some of the deep punched out satellite lesions distal to it. There is some improvement 4/16; the application for Apligraf was put out for further review for material that we have resubmitted. He has the deep area on the right posterior calf which is large and then 3 small punched out areas beneath this. In general his wounds look a lot better 4/23; the patient has his large original wound and then 2 punched out satellite lesions below it on the right posterior calf. I was usually able to use  Apligraf #1 to cover the full surface area of the larger wound. We continued with Santyl and Hydrofera Blue on the 2 punched-out areas 5/7; patient has his large original wound and 2 punched out satellite lesions below it in the right posterior calf. Apligraf #2 today. Major improvement in the big wound in terms of wound depth. Punched-out [place] wounds have a very healthy looking wound bed. 5/21-Patient comes back for his large right posterior calf wound for  which she has been an Apligraf #3 today. Wound depth seems to be better, the punched-out wounds appear to have healthy bed with some bleeding 6/4; right posterior calf wound is much better. Apligraf #4 today. The major wound has no wound depth at all. Only 1 of the satellite lesions is still open. The patient has very high blood pressure coming in today with a diastolic blood pressure of 130 after lying there for a few minutes it was down to 110. He states that is running 100 210 diastolic at home. He was high last week as well I thought he was on 50 mg 1/2 tablet twice daily of losartan I told him to double up on that however it turns out he is actually on 50 twice daily. Course he is run out of his medications prematurely. He has a follow-up with his primary's office next Wednesday. 6/18; patient's wounds continue to improve. The small area laterally is just about closed and there is continued epithelialization on the area on the posterior calf. 7/2; we applied his last Apligraf last visit. Early the next week there was a lot of purulent looking drainage that I cultured this grew staph and Pseudomonas however when we had him back for the next visit to 3 days later everything looked a lot better. He did not receive systemic antibiotics. Fortunately we did not have to remove the Apligraf. He has had heart trouble this week he was having an ablation apparently they have discovered a clot in his left atrium they have changed all his anticoagulants as well as his blood pressure medication. 7/16; using Hydrofera Blue. We are making nice progress towards closure. He has not yet ordered his compression stockings he has his measurements 7/23; dimensions are better. He has a new satellite area superiorly. Both wounds cauterized with silver nitrate. 7/30; absolutely no change in the major wound on the posterior part of the right calf. We have been using Hydrofera Blue for a prolonged period of time and really  had a nice improvement but over the last few weeks this is stalled. There is no depth. He arrived in clinic today with a new area on the right anterior tibia which apparently was an "ingrown hair". It is clearly an open area. This looks like a wrap injury although he was not really aware of it. 8/31; since the patient was last in clinic he was admitted to hospital from 8/17 through 8/22. He had had multiple falls at home including one off a ladder. He was admitted to hospital with mild COVID-19 viral pneumonia. Noted to be hyperkalemic and in acute renal failure. He has chronic systolic heart failure with ejection fraction of 35%. He arrives back in clinic with the original wound on the posterior aspect of the right lower calf looking a lot better. He has eschared areas from falls and lacerations on his anterior knees bilaterally just below the patella and on the mid part of his tibia bilaterally. He seems to have a small area on the right lateral  ankle as well 9/10- Patient comes to clinic with a 2 new wounds one on the left anterior shin and one on the right posterior leg both the left knee and right knee areas are healed up. We have been using Santyl to the left anterior leg and silver alginate to right posterior leg wounds. 9/17; the patient is original wound looks good on the posterior right calf. He has traumatic areas on the left anterior shin left anterior patellar tendon and the right mid tibia area. Except for the right posterior calf all required debridement. We have been using Santyl on these areas and silver alginate posteriorly right calf 9/24; the original wound on the posterior calf on the right looks good. This is healthy. There are traumatic wounds in the left anterior shin, left anterior patella in the right mid tibia area are about the same. He has dusky erythema on the left anterior tibia which led me to give him antibiotics last week. This does not look too much different. This  is nontender and I wonder if this is all just venous inflammation. 10/2; the patient has 2 open areas on the right posterior calf both of these look good we have been using silver alginate. He has traumatic wounds on the left and right mid tibia areas. The surface of these wounds is still not ready for closure. We have been using Iodoflex. Finally has areas on the left knee. The latter wounds are all traumatic after a fall 10/9. His original wounds of the right posterior calf are superficial and progressing towards closure. His traumatic wounds on the left anterior tibia and right anterior tibia are considerably improved in terms of the wound surface. He asked me about a skin graft in these areas I do not think this is going to be necessary. Change from Iodosorb to [facility]. The area on the left knee is just about closed as well 10/16; his original wound on the right posterior calf very superficial looks healthy. He has more recent traumatic wounds on the left anterior tibia and right anterior tibia both of these have better looking surfaces and measuring smaller. Might be beneficial for an advanced treatment product. The area on his left knee has a small superficial scab and we are going to close that when 10/23; his original wound areas on the right posterior calf continue to improve. The more recently traumatic wounds on the left anterior tibia and right anterior tibia both look better in terms of surfaces. No debridement required. We have been using Hydrofera Blue. He is approved for KAMDYN, CATINO ([id number]) [id number] Page 7 of 9 11/3; Apligraf #1 applied to the left anterior tibia right anterior tibia and to 2 small areas remaining on the right posterior calf which were part of his initial wound area. 11/19; Apligraf #2. Left anterior tibia is healed. Right anterior tibia much better. Only one small area remains on the right posterior calf which  was part of his initial wound area 12/3; the left anterior tibia has opened up again. Hyper granulated nodule. Right anterior tibia is superficial and larger. There is no depth of this. I did not place Apligraf today return to [facility] under compression 12/10; the patient has 2 remaining wounds 1 on the right posterior and the other on the left anterior. Both of these look quite good. No debridement is required we have been using Hydrofera Blue under compression. 12/17; the patient has really not closed over which is disappointing. His wraps  were very tight and there may be some wrap injury issues. On the left he has the original wound on the medial mid tibia he has a wrap injury on the lateral mid tibia. On the right his posterior areas are superficial but certainly not closed 12/31; the patient has totally closed on the left. He will transition to his own 20/30 below-knee stocking on the left leg today. Interestingly on the right posterior calf the 2 wounds that he had from last time of closed however there is an additional injury in this same area. Almost looks as though there is subcutaneous fluid/blistering. There is no evidence of cellulitis this does not seem tender 06/24/2019; the patient comes in having been placed in his 20/30 stockings on the left leg last week. He states that he noted blisters break open on Saturday and they had opened into wounds on the anterior tibial area on the left by Monday. The area on the posterior right leg looks satisfactory we are still doing that and compression. We are using calcium alginate to all wound areas 1/14; the patient had juxta light however one of them was not the right size which will need to be returned. The new area last week on the left anterior tibia looks a lot better it measures smaller I would say the area on the right is about the same, this is on the right posterior calf. We have been using silver alginate 1/21; he has his bilateral  juxta lites however the left anterior wound continues to look better where is the 2 areas on the right posterior calf look about the same. We have been using Hydrofera Blue under compression with the juxta lite stockings now on standby 2/4; the areas on the left are totally healed and he will be discharged into his own stocking/juxta light. On the right posterior calf he has what looks to be a blister with a small area of subdermal hemorrhage this is very tiny but I did I am going to wrap him today and see if this will be reabsorbed by next week 2/11; he comes in this week with expanded wounds on the posterior calf as well as a new wound on the left anterior. He said the left anterior started a few days ago with a blister that is now surface. He is using his juxta lite stockings at a pressure of 30/40 2/19; we wrapped his left leg last week after a failed attempt to discharge that leg and his juxta lites 2 weeks ago. The leg is again healed he has superficial areas posteriorly on the right leg 2/26; the patient came in initially with right posterior calf wound secondary to minor trauma with secondary infection. He went on to develop a left lower leg wound. In the interim he had congestive heart failure with acute renal failure a fall with bilateral anterior leg wounds and finally Covid requiring admission to pneumonia with pneumonia. He is finally closed. He has external compression stockings bilaterally [juxta lite stockings]. His wounds are healed READMISSION 03/03/2023 This is a patient that we had here for a year from 20 20 through 20 21 with a sizable wound on the right posterior calf which was secondary to a combination of chronic venous insufficiency and secondary infection in the wound when he came here. This was a problematic wound in itself however he developed traumatic wounds on his lower extremities after a fall from a ladder during the same admission. Eventually he was healed out and  discharged and juxta lite stockings. He tells me that everything was fine up until about 2-1/2 months ago he developed a blister and an open area on the left posterior calf this time. He saw his primary doctor and received a course of Bactrim. He has been using Silvadene with AandE ointment and covering this with gauze. He is here for review of this area. When he was here in 2021 he also saw Dr. Woodfin Ganja of vein and vascular he had venous reflux studies that showed deep venous reflux but nothing that was amenable to surgery. He also had arterial studies. He was noncompressible on the left but had normal TBIs and triphasic waveforms. In terms of the reflux studies I am not really sure whether the left leg was involved in that study or not we will need to review that. His past medical history includes type type 2 diabetes, chronic venous insufficiency with secondary lymphedema, intermittent A-fib, nonischemic cardiomyopathy, thrombus of the left atrial appendage, hereditary hemochromatosis cytosis. He is now on Coumadin presumably for the atrial fibrillation. ABIs in this clinic were not repeated 9/24; posterior left calf wound. We used Hydrofera Blue under compression. The wound looks better this week better looking surface. 10/1. Posterior left calf wound secondary to venous insufficiency. He was new to the clinic in 2 weeks ago at which time I thought he was going to require a difficult set of mechanical debridements however the wound surface is continuing to improve using Hydrofera Blue although the dimensions not so remarkably improved today 10/8; posterior left calf wound secondary to chronic venous insufficiency. He continues to think nice progress here on this wound the wound is smaller better looking surface. He arrived in clinic today with an abrasion on his Achilles area on the left leg as well as what looked to be a potential deep tissue injury on the lateral lateral part of the left  foot at the level of the base of the fifth metatarsal. 10/15 posterior left calf wound continues to improve. We have been using Prisma under ABDs under Urgo K2 lite's. The areas on the Achilles and the left lateral foot which were concerning last time of maintain skin integrity. He is not wearing stockings even on the right leg I talked to him about this. 10/22; posterior left calf wound measures slightly larger today. The abrasion injury in the Achilles send on the lateral fifth metatarsal base look improved. We have been using Prisma under compression I am going to change this to [facility] today 10/29; posterior left calf wound continues to progress nicely. Presumably an abrasion injury on the Achilles heel. I changed dressing to Hydrofera Blue. Will need to dress the area on the Achilles today. The area on the fifth metatarsal base is not open 11/5; posterior left calf wound continues to improve nicely presumably an abrasion injury on the left Achilles also looks clean. He has a what looks to be an abrasion on the base of the fifth metatarsal it is not open but it looks as though there is been some friction on this area we will pad this today under the wrap. Patient with chronic venous insufficiency severe hemosiderin deposition and resultant wounds. He has been in the clinic before however that was for a wound on the right posterior calf. Notable he is not wearing stockings even on the uninvolved right leg today Objective Constitutional Sitting or standing Blood Pressure is within target range for patient.. Pulse regular and within target range for patient.[name]  Respirations regular, non-labored and within target range.. Temperature is normal and within the target range for the patient.[name] Appears in no distress. Vitals Time Taken: 2:45 PM, Height: 73 in, Weight: 254 lbs, BMI: 33.5, Temperature: 97.9 F, Pulse: 76 bpm, Respiratory Rate: 18 breaths/min, Blood Pressure: ELBURN, CLEMMONS ([id number])  [phone].pdf Page 8 of 9 120/84 mmHg, Capillary Blood Glucose: 146 mg/dl. General Notes: Wound exam; left posterior calf wound healthy granulation advancing epithelialization. I see no problems here. The Achilles area looks a little wet and some surface debris I did not debride this today but will do so next week if there is no improvement in wound dimensions. He has a small area on the left fifth metatarsal base. This is not open I suspect this is wrap injury we will pad this area Integumentary (Hair, Skin) Wound #13 status is Open. Original cause of wound was Blister. The date acquired was: 01/20/2023. The wound has been in treatment 7 weeks. The wound is located on the Left,Posterior Lower Leg. The wound measures 2.8cm length x 3cm width x 0.1cm depth; 6.597cm^2 area and 0.66cm^3 volume. There is Fat Layer (Subcutaneous Tissue) exposed. There is a medium amount of serosanguineous drainage noted. The wound margin is distinct with the outline attached to the wound base. There is large (67-100%) red, friable granulation within the wound bed. There is no necrotic tissue within the wound bed. The periwound skin appearance exhibited: Hemosiderin Staining. The periwound skin appearance did not exhibit: Callus, Crepitus, Excoriation, Induration, Rash, Scarring, Dry/Scaly, Maceration, Atrophie Blanche, Cyanosis, Ecchymosis, Mottled, Pallor, Rubor, Erythema. Wound #14 status is Open. Original cause of wound was Blister. The date acquired was: 04/07/2023. The wound has been in treatment 1 weeks. The wound is located on the Left Calcaneus. The wound measures 0.4cm length x 0.9cm width x 0.1cm depth; 0.283cm^2 area and 0.028cm^3 volume. There is a medium amount of serosanguineous drainage noted. There is large (67-100%) pink granulation within the wound bed. The periwound skin appearance did not exhibit: Callus, Crepitus, Excoriation, Induration, Rash, Scarring, Dry/Scaly, Maceration,  Atrophie Blanche, Cyanosis, Ecchymosis, Hemosiderin Staining, Mottled, Pallor, Rubor, Erythema. Assessment Active Problems ICD-10 Non-pressure chronic ulcer of other part of left lower leg with other specified severity Chronic venous hypertension (idiopathic) with ulcer and inflammation of left lower extremity Non-pressure chronic ulcer of left ankle with other specified severity Type 2 diabetes mellitus with other skin ulcer Procedures Wound #13 Pre-procedure diagnosis of Wound #13 is a Venous Leg Ulcer located on the Left,Posterior Lower Leg . There was a Double Layer Compression Therapy Procedure by Redmond Pulling, RN. Post procedure Diagnosis Wound #13: Same as Pre-Procedure Wound #14 Pre-procedure diagnosis of Wound #14 is an Abrasion located on the Left Calcaneus . There was a Double Layer Compression Therapy Procedure by Redmond Pulling, RN. Post procedure Diagnosis Wound #14: Same as Pre-Procedure Plan Follow-up Appointments: Return Appointment in 1 week. - Tuesday 04/27/23 @ 1:45 w/ Dr. Leanord Hawking Anesthetic: (In clinic) Topical Lidocaine 4% applied to wound bed Bathing/ Shower/ Hygiene: May shower with protection but do not get wound dressing(s) wet. Protect dressing(s) with water repellant cover (for example, large plastic bag) or a cast cover and may then take shower. WOUND #13: - Lower Leg Wound Laterality: Left, Posterior Cleanser: Soap and Water 1 x Per Week/15 Days Discharge Instructions: May shower and wash wound with dial antibacterial soap and water prior to dressing change. Cleanser: Vashe 5.8 (oz) 1 x Per Week/15 Days Discharge Instructions: Cleanse the wound with Vashe prior to applying  a clean dressing using gauze sponges, not tissue or cotton balls. Peri-Wound Care: Sween Lotion (Moisturizing lotion) 1 x Per Week/15 Days Discharge Instructions: Apply moisturizing lotion as directed Prim Dressing: Hydrofera Blue Ready Transfer Foam, 4x5 (in/in) 1 x Per Week/15  Days ary Discharge Instructions: Apply to wound bed as instructed Secondary Dressing: ABD Pad, 8x10 1 x Per Week/15 Days Discharge Instructions: Apply over primary dressing as directed. Secondary Dressing: Optifoam Non-Adhesive Dressing, 4x4 in 1 x Per Week/15 Days Discharge Instructions: Apply to ankle, achilles, lateral part of foot for protection Com pression Wrap: Urgo K2, (equivalent to a 4 layer) two layer compression system, regular 1 x Per Week/15 Days Discharge Instructions: Apply Urgo K2 as directed (alternative to 4 layer compression). WOUND #14: - Calcaneus Wound Laterality: Left Cleanser: Soap and Water 1 x Per Week/15 Days Discharge Instructions: May shower and wash wound with dial antibacterial soap and water prior to dressing change. Cleanser: Vashe 5.8 (oz) 1 x Per Week/15 Days Discharge Instructions: Cleanse the wound with Vashe prior to applying a clean dressing using gauze sponges, not tissue or cotton balls. Peri-Wound Care: Sween Lotion (Moisturizing lotion) 1 x Per Week/15 Days FORTUNATO, KNACKSTEDT ([id number]) [id number] Page 9 of 9 Discharge Instructions: Apply moisturizing lotion as directed Prim Dressing: Hydrofera Blue Ready Transfer Foam, 4x5 (in/in) 1 x Per Week/15 Days ary Discharge Instructions: Apply to wound bed as instructed Secondary Dressing: ABD Pad, 8x10 1 x Per Week/15 Days Discharge Instructions: Apply over primary dressing as directed. Secondary Dressing: Optifoam Non-Adhesive Dressing, 4x4 in 1 x Per Week/15 Days Discharge Instructions: Apply to ankle, achilles, lateral part of foot for protection Com pression Wrap: Urgo K2, (equivalent to a 4 layer) two layer compression system, regular 1 x Per Week/15 Days Discharge Instructions: Apply Urgo K2 as directed (alternative to 4 layer compression). 1. Still using Hydrofera Blue on both wounds. Will pad the left fifth metatarsal base. ABDs and Urgo K2 compression. 2. I asked him  about his stockings which she is not wearing even on the uninvolved right leg. I will ask him about this again as we get closer to healing the left leg Electronic Signature(s) Signed: 04/23/2023 4:34:18 PM By: Baltazar Najjar MD Signed: 04/23/2023 6:10:25 PM By: Shawn Stall RN, BSN Previous Signature: 04/21/2023 4:32:14 PM Version By: Baltazar Najjar MD Entered By: Shawn Stall on 04/23/2023 16:02:47 -------------------------------------------------------------------------------- SuperBill Details Patient Name: Date of Service: [name] 04/21/2023 Medical Record Number: [id number] Patient Account Number: [id number] Date of Birth/Sex: Treating RN: 17-May-1956 (67 y.o. M) Primary Care Provider: Hillard Danker Other Clinician: Referring Provider: Treating Provider/Extender: Shara Blazing in Treatment: 7 Diagnosis Coding ICD-10 Codes Code Description [phone] Non-pressure chronic ulcer of other part of left lower leg with other specified severity I87.332 Chronic venous hypertension (idiopathic) with ulcer and inflammation of left lower extremity L97.328 Non-pressure chronic ulcer of left ankle with other specified severity E11.622 Type 2 diabetes mellitus with other skin ulcer Facility Procedures : CPT4 Code: [id number] Description: (Facility Use Only) [phone] - APPLY MULTLAY COMPRS LWR LT LEG ICD-10 Diagnosis Description L97.828 Non-pressure chronic ulcer of other part of left lower leg with other specified sever I87.332 Chronic venous hypertension (idiopathic) with  ulcer and inflammation of left lower ex L97.328 Non-pressure chronic ulcer of left ankle with other specified severity E11.622 Type 2 diabetes mellitus with other skin ulcer Modifier: ity tremity Quantity: 1 Physician Procedures : CPT4 Code Description Modifier [id number] - WC PHYS LEVEL 3 - EST PT  ICD-10 Diagnosis Description L97.828 Non-pressure chronic ulcer of other part of left  lower leg with other specified severity L97.328 Non-pressure chronic ulcer of left ankle with  other specified severity I87.332 Chronic venous hypertension (idiopathic) with ulcer and inflammation of left lower extremity Quantity: 1 Electronic Signature(s) Signed: 04/22/2023 10:07:20 AM By: Pearletha Alfred Signed: 04/22/2023 4:54:50 PM By: Baltazar Najjar MD Previous Signature: 04/21/2023 4:32:14 PM Version By: Baltazar Najjar MD Entered By: Pearletha Alfred on 04/22/2023 10:07:20

## 2023-04-23 ENCOUNTER — Other Ambulatory Visit: Payer: Self-pay | Admitting: Internal Medicine

## 2023-04-24 NOTE — Progress Notes (Signed)
JSON, GERSH ([id number]) [id number] Page 1 of 8 Visit Report for 04/21/2023 Arrival Information Details Patient Name: Date of Service: [name], [name] 04/21/2023 2:30 PM Medical Record Number: [id number] Patient Account Number: [id number] Date of Birth/Sex: Treating RN: Aug 01, 1955 (67 y.o. M) Primary Care [name]: [name] Other Clinician: Referring [name]: Treating [name]/Extender: [name] in Treatment: 7 Visit Information History Since Last Visit Added or deleted any medications: No Patient Arrived: Ambulatory Any new allergies or adverse reactions: No Arrival Time: 14:35 Had a fall or experienced change in No Accompanied By: self activities of daily living that may affect Transfer Assistance: None risk of falls: Patient Identification Verified: Yes Signs or symptoms of abuse/neglect since last visito No Secondary Verification Process Completed: Yes Hospitalized since last visit: No Patient Requires Transmission-Based Precautions: No Has Dressing in Place as Prescribed: Yes Patient Has Alerts: Yes Has Compression in Place as Prescribed: Yes Patient Alerts: Patient on Blood Thinner Pain Present Now: No Electronic Signature(s) Signed: 04/24/2023 12:52:25 PM By: [name] Entered By: [name] on 04/21/2023 14:35:32 -------------------------------------------------------------------------------- Compression Therapy Details Patient Name: Date of Service: [name]. 04/21/2023 2:30 PM Medical Record Number: [id number] Patient Account Number: [id number] Date of Birth/Sex: Treating RN: [birth year] (67 y.o. M) Primary Care Jorey Dollard: [name] Other Clinician: Referring [name]: Treating [name]/Extender: [name] in Treatment: 7 Compression Therapy Performed for Wound Assessment: Wound #13 Left,Posterior Lower Leg Performed By: Clinician  [name] Pulling, RN Compression Type: Double Layer Post Procedure Diagnosis Same as Pre-procedure Electronic Signature(s) Signed: 04/22/2023 10:06:39 AM By: [name] Entered By: [name] on 04/22/2023 10:06:39 -------------------------------------------------------------------------------- Compression Therapy Details Patient Name: Date of Service: [name], [name]. 04/21/2023 2:30 PM [name] ([id number]) [id number] Page 2 of 8 Medical Record Number: [id number] Patient Account Number: [id number] Date of Birth/Sex: Treating RN: Feb 18, 1956 (67 y.o. M) Primary Care [name]: [name] Other Clinician: Referring [name]: Treating [name]/Extender: [name] in Treatment: 7 Compression Therapy Performed for Wound Assessment: Wound #14 Left Calcaneus Performed By: Clinician [name] Pulling, RN Compression Type: Double Layer Post Procedure Diagnosis Same as Pre-procedure Electronic Signature(s) Signed: 04/22/2023 10:06:39 AM By: [name] Entered By: [name] on 04/22/2023 10:06:39 -------------------------------------------------------------------------------- Lower Extremity Assessment Details Patient Name: Date of Service: [name], [name] 04/21/2023 2:30 PM Medical Record Number: [id number] Patient Account Number: [id number] Date of Birth/Sex: Treating RN: [birth year] (67 y.o. M) Primary Care [name]: [name] Other Clinician: Referring [name]: Treating [name]/Extender: [name] in Treatment: 7 Edema Assessment Assessed: [Left: No] [Right: No] Edema: [Left: N] [Right: o] Calf Left: Right: Point of Measurement: 33 cm From Medial Instep 38 cm Ankle Left: Right: Point of Measurement: 12 cm From Medial Instep 22.5 cm Vascular Assessment Extremity colors, hair growth, and conditions: Extremity Color: [Left:Hyperpigmented] Hair Growth on  Extremity: [Left:No] Temperature of Extremity: [Left:Warm] Capillary Refill: [Left:< 3 seconds] Dependent Rubor: [Left:No Yes] Electronic Signature(s) Signed: 04/24/2023 12:52:25 PM By: [name] Entered By: [name] on 04/21/2023 14:44:56 -------------------------------------------------------------------------------- Multi Wound Chart Details Patient Name: Date of Service: [name], [name]. 04/21/2023 2:30 PM Medical Record Number: [id number] Patient Account Number: [id number] [name] ([id number]) [id number] Page 3 of 8 Date of Birth/Sex: Treating RN: 12/06/1955 (67 y.o. M) Primary Care [name]: Other Clinician: Hillard [name] Referring [name]: Treating [name]/Extender: [name] in Treatment: 7 Vital Signs Height(in): 73 Capillary Blood Glucose(mg/dl): 175 Weight(lbs): 102 Pulse(bpm): 76 Body Mass Index(BMI): 33.5 Blood  Pressure(mmHg): 120/84 Temperature(F): 97.9 Respiratory Rate(breaths/min): 18 [13:Photos:] [N/A:N/A] Left, Posterior Lower Leg Left Calcaneus N/A Wound Location: Blister Blister N/A Wounding Event: Venous Leg Ulcer Abrasion N/A Primary Etiology: Diabetic Wound/Ulcer of the Lower N/A N/A Secondary Etiology: Extremity Asthma, Sleep Apnea, Arrhythmia, Asthma, Sleep Apnea, Arrhythmia, N/A Comorbid History: Congestive Heart Failure, Congestive Heart Failure, Hypertension, Type II Diabetes Hypertension, Type II Diabetes 01/20/2023 04/07/2023 N/A Date Acquired: 7 1 N/A Weeks of Treatment: Open Open N/A Wound Status: No No N/A Wound Recurrence: 2.8x3x0.1 0.4x0.9x0.1 N/A Measurements L x W x D (cm) 6.597 0.283 N/A A (cm) : rea 0.66 0.028 N/A Volume (cm) : 66.10% -44.40% N/A % Reduction in Area: 83.00% -40.00% N/A % Reduction in Volume: Full Thickness Without Exposed Full Thickness Without Exposed N/A Classification: Support Structures Support Structures Medium  Medium N/A Exudate Amount: Serosanguineous Serosanguineous N/A Exudate Type: red, brown red, brown N/A Exudate Color: Distinct, outline attached N/A N/A Wound Margin: Large (67-100%) Large (67-100%) N/A Granulation Amount: Red, Friable Pink N/A Granulation Quality: None Present (0%) N/A N/A Necrotic Amount: Fat Layer (Subcutaneous Tissue): Yes N/A N/A Exposed Structures: Fascia: No Tendon: No Muscle: No Joint: No Bone: No Small (1-33%) None N/A Epithelialization: Excoriation: No Excoriation: No N/A Periwound Skin Texture: Induration: No Induration: No Callus: No Callus: No Crepitus: No Crepitus: No Rash: No Rash: No Scarring: No Scarring: No Maceration: No Maceration: No N/A Periwound Skin Moisture: Dry/Scaly: No Dry/Scaly: No Hemosiderin Staining: Yes Atrophie Blanche: No N/A Periwound Skin Color: Atrophie Blanche: No Cyanosis: No Cyanosis: No Ecchymosis: No Ecchymosis: No Erythema: No Erythema: No Hemosiderin Staining: No Mottled: No Mottled: No Pallor: No Pallor: No Rubor: No Rubor: No Treatment Notes Electronic Signature(s) Signed: 04/21/2023 4:32:14 PM By: Baltazar Najjar MD Entered By: Baltazar Najjar on 04/21/2023 15:07:52 [name] ([id number]) [id number] Page 4 of 8 -------------------------------------------------------------------------------- Multi-Disciplinary Care Plan Details Patient Name: Date of Service: [name], [name] 04/21/2023 2:30 PM Medical Record Number: [id number] Patient Account Number: [id number] Date of Birth/Sex: Treating RN: 03/18/1956 (66 y.o. [name] Primary Care Alberto Pina: [name] Other Clinician: Referring Sudiksha Victor: Treating Marilynne Dupuis/Extender: [name] in Treatment: 7 Active Inactive Wound/Skin Impairment Nursing Diagnoses: Impaired tissue integrity Knowledge deficit related to ulceration/compromised skin  integrity Goals: Patient/caregiver will verbalize understanding of skin care regimen Date Initiated: 03/03/2023 Target Resolution Date: 05/15/2023 Goal Status: Active Ulcer/skin breakdown will have a volume reduction of 30% by week 4 Date Initiated: 03/03/2023 Target Resolution Date: 04/18/2023 Goal Status: Active Interventions: Assess patient/caregiver ability to obtain necessary supplies Assess patient/caregiver ability to perform ulcer/skin care regimen upon admission and as needed Assess ulceration(s) every visit Provide education on ulcer and skin care Notes: Electronic Signature(s) Signed: 04/21/2023 4:19:11 PM By: [name] Pulling RN, BSN Entered By: [name] on 04/21/2023 14:51:03 -------------------------------------------------------------------------------- Pain Assessment Details Patient Name: Date of Service: [name] Asper NDY [name]. 04/21/2023 2:30 PM Medical Record Number: [id number] Patient Account Number: [id number] Date of Birth/Sex: Treating RN: 1956/06/13 (67 y.o. M) Primary Care Cedarius Kersh: [name] Other Clinician: Referring Khara Renaud: Treating Khamia Stambaugh/Extender: [name] in Treatment: 7 Active Problems Location of Pain Severity and Description of Pain Patient Has Paino No Site Locations [place], [place] [name] ([id number]) [id number] Page 5 of 8 Pain Management and Medication Current Pain Management: Electronic Signature(s) Signed: 04/24/2023 12:52:25 PM By: [name] Entered By: [name] on 04/21/2023 14:35:48 -------------------------------------------------------------------------------- Patient/Caregiver Education Details Patient Name: Date of Service: [name] 11/5/2024andnbsp2:30 PM Medical Record Number: [id number] Patient Account Number:  [id number] Date of Birth/Gender: Treating RN: 1956-02-22 (67 y.o. [name] Primary Care Physician: [name] Other  Clinician: Referring Physician: Treating Physician/Extender: [name] in Treatment: 7 Education Assessment Education Provided To: Patient Education Topics Provided Wound/Skin Impairment: Methods: Explain/Verbal Responses: State content correctly Electronic Signature(s) Signed: 04/21/2023 4:19:11 PM By: [name] Pulling RN, BSN Entered By: [name] on 04/21/2023 14:51:20 -------------------------------------------------------------------------------- Wound Assessment Details Patient Name: Date of Service: [name] Asper NDY [name]. 04/21/2023 2:30 PM Medical Record Number: [id number] Patient Account Number: [id number] Date of Birth/Sex: Treating RN: April 16, 1956 (67 y.o. M) Primary Care [name]: [name] Other Clinician: Referring Jamez Ambrocio: Treating Kelsy Polack/Extender: Brodie, Christin, Dalene Seltzer ([id number]) [id number] Page 6 of 8 Weeks in Treatment: 7 Wound Status Wound Number: 13 Primary Venous Leg Ulcer Etiology: Wound Location: Left, Posterior Lower Leg Secondary Diabetic Wound/Ulcer of the Lower Extremity Wounding Event: Blister Etiology: Date Acquired: 01/20/2023 Wound Open Weeks Of Treatment: 7 Status: Clustered Wound: No Comorbid Asthma, Sleep Apnea, Arrhythmia, Congestive Heart Failure, History: Hypertension, Type II Diabetes Photos Wound Measurements Length: (cm) 2.8 Width: (cm) 3 Depth: (cm) 0.1 Area: (cm) 6.597 Volume: (cm) 0.66 % Reduction in Area: 66.1% % Reduction in Volume: 83% Epithelialization: Small (1-33%) Wound Description Classification: Full Thickness Without Exposed Support Structures Wound Margin: Distinct, outline attached Exudate Amount: Medium Exudate Type: Serosanguineous Exudate Color: red, brown Foul Odor After Cleansing: No Slough/Fibrino No Wound Bed Granulation Amount: Large (67-100%) Exposed Structure Granulation Quality: Red,  Friable Fascia Exposed: No Necrotic Amount: None Present (0%) Fat Layer (Subcutaneous Tissue) Exposed: Yes Tendon Exposed: No Muscle Exposed: No Joint Exposed: No Bone Exposed: No Periwound Skin Texture Texture Color No Abnormalities Noted: No No Abnormalities Noted: No Callus: No Atrophie Blanche: No Crepitus: No Cyanosis: No Excoriation: No Ecchymosis: No Induration: No Erythema: No Rash: No Hemosiderin Staining: Yes Scarring: No Mottled: No Pallor: No Moisture Rubor: No No Abnormalities Noted: No Dry / Scaly: No Maceration: No Electronic Signature(s) Signed: 04/24/2023 12:52:25 PM By: [name] Entered By: [name] on 04/21/2023 14:47:43 [name] ([id number]) [id number] Page 7 of 8 -------------------------------------------------------------------------------- Wound Assessment Details Patient Name: Date of Service: [name], [name] 04/21/2023 2:30 PM Medical Record Number: [id number] Patient Account Number: [id number] Date of Birth/Sex: Treating RN: February 22, 1956 (67 y.o. M) Primary Care Amine Adelson: [name] Other Clinician: Referring Amonda Brillhart: Treating Kadasia Kassing/Extender: [name] in Treatment: 7 Wound Status Wound Number: 14 Primary Abrasion Etiology: Wound Location: Left Calcaneus Wound Open Wounding Event: Blister Status: Date Acquired: 04/07/2023 Comorbid Asthma, Sleep Apnea, Arrhythmia, Congestive Heart Failure, Weeks Of Treatment: 1 History: Hypertension, Type II Diabetes Clustered Wound: No Photos Wound Measurements Length: (cm) 0.4 Width: (cm) 0.9 Depth: (cm) 0.1 Area: (cm) 0.283 Volume: (cm) 0.028 % Reduction in Area: -44.4% % Reduction in Volume: -40% Epithelialization: None Wound Description Classification: Full Thickness Without Exposed Suppor Exudate Amount: Medium Exudate Type: Serosanguineous Exudate Color: red, brown t Structures Foul Odor  After Cleansing: No Slough/Fibrino No Wound Bed Granulation Amount: Large (67-100%) Granulation Quality: Pink Periwound Skin Texture Texture Color No Abnormalities Noted: No No Abnormalities Noted: No Callus: No Atrophie Blanche: No Crepitus: No Cyanosis: No Excoriation: No Ecchymosis: No Induration: No Erythema: No Rash: No Hemosiderin Staining: No Scarring: No Mottled: No Pallor: No Moisture Rubor: No No Abnormalities Noted: No Dry / Scaly: No Maceration: No Electronic Signature(s) Signed: 04/24/2023 12:52:25 PM By: [name] Entered By: [name] on 04/21/2023 14:48:11 [name] ([id number]) [id number] Page 8 of 8 --------------------------------------------------------------------------------  Vitals Details Patient Name: Date of Service: [name], [name] 04/21/2023 2:30 PM Medical Record Number: [id number] Patient Account Number: [id number] Date of Birth/Sex: Treating RN: [birth year] (67 y.o. M) Primary Care Hazaiah Edgecombe: [name] Other Clinician: Referring Umi Mainor: Treating Zuzanna Maroney/Extender: [name] in Treatment: 7 Vital Signs Time Taken: 14:45 Temperature (F): 97.9 Height (in): 73 Pulse (bpm): 76 Weight (lbs): 254 Respiratory Rate (breaths/min): 18 Body Mass Index (BMI): 33.5 Blood Pressure (mmHg): 120/84 Capillary Blood Glucose (mg/dl): 440 Reference Range: 80 - 120 mg / dl Electronic Signature(s) Signed: 04/24/2023 12:52:25 PM By: [name] Entered By: [name] on 04/21/2023 14:45:45

## 2023-04-27 ENCOUNTER — Encounter (HOSPITAL_BASED_OUTPATIENT_CLINIC_OR_DEPARTMENT_OTHER): Payer: Medicare HMO | Admitting: Internal Medicine

## 2023-04-27 DIAGNOSIS — I480 Paroxysmal atrial fibrillation: Secondary | ICD-10-CM | POA: Diagnosis not present

## 2023-04-27 DIAGNOSIS — L97828 Non-pressure chronic ulcer of other part of left lower leg with other specified severity: Secondary | ICD-10-CM | POA: Diagnosis not present

## 2023-04-27 DIAGNOSIS — L97818 Non-pressure chronic ulcer of other part of right lower leg with other specified severity: Secondary | ICD-10-CM | POA: Diagnosis not present

## 2023-04-27 DIAGNOSIS — S80812A Abrasion, left lower leg, initial encounter: Secondary | ICD-10-CM | POA: Diagnosis not present

## 2023-04-27 DIAGNOSIS — I87332 Chronic venous hypertension (idiopathic) with ulcer and inflammation of left lower extremity: Secondary | ICD-10-CM | POA: Diagnosis not present

## 2023-04-27 DIAGNOSIS — S90812A Abrasion, left foot, initial encounter: Secondary | ICD-10-CM | POA: Diagnosis not present

## 2023-04-27 DIAGNOSIS — I5040 Unspecified combined systolic (congestive) and diastolic (congestive) heart failure: Secondary | ICD-10-CM | POA: Diagnosis not present

## 2023-04-27 DIAGNOSIS — Z7901 Long term (current) use of anticoagulants: Secondary | ICD-10-CM | POA: Diagnosis not present

## 2023-04-27 DIAGNOSIS — L97822 Non-pressure chronic ulcer of other part of left lower leg with fat layer exposed: Secondary | ICD-10-CM | POA: Diagnosis not present

## 2023-04-27 DIAGNOSIS — E11622 Type 2 diabetes mellitus with other skin ulcer: Secondary | ICD-10-CM | POA: Diagnosis not present

## 2023-04-27 DIAGNOSIS — L97328 Non-pressure chronic ulcer of left ankle with other specified severity: Secondary | ICD-10-CM | POA: Diagnosis not present

## 2023-04-27 DIAGNOSIS — I428 Other cardiomyopathies: Secondary | ICD-10-CM | POA: Diagnosis not present

## 2023-04-27 DIAGNOSIS — I872 Venous insufficiency (chronic) (peripheral): Secondary | ICD-10-CM | POA: Diagnosis not present

## 2023-04-27 NOTE — Progress Notes (Signed)
[name] ([id number]) [id number] Page 1 of 13 Visit Report for 04/27/2023 Debridement Details Patient Name: Date of Service: [name], [name] 04/27/2023 1:45 PM Medical Record Number: [id number] Patient Account Number: [id number] Date of Birth/Sex: Treating RN: 23-Sep-1955 (67 y.o. M) Primary Care Provider: Hillard Danker Other Clinician: Referring Provider: Treating Provider/Extender: Shara Blazing in Treatment: 7 Debridement Performed for Assessment: Wound #13 Left,Posterior Lower Leg Performed By: Physician Maxwell Caul., MD The following information was scribed by: Redmond Pulling The information was scribed for: Baltazar Najjar Debridement Type: Debridement Severity of Tissue Pre Debridement: Fat layer exposed Level of Consciousness (Pre-procedure): Awake and Alert Pre-procedure Verification/Time Out Yes - 14:10 Taken: Start Time: 14:10 Pain Control: Lidocaine 5% topical ointment Percent of Wound Bed Debrided: 100% T Area Debrided (cm): otal 2.36 Tissue and other material debrided: Non-Viable, Slough, Slough Level: Non-Viable Tissue Debridement Description: Selective/Open Wound Instrument: Curette Bleeding: Minimum Hemostasis Achieved: Pressure Response to Treatment: Procedure was tolerated well Level of Consciousness (Post- Awake and Alert procedure): Post Debridement Measurements of Total Wound Length: (cm) 2 Width: (cm) 1.5 Depth: (cm) 0.1 Volume: (cm) 0.236 Character of Wound/Ulcer Post Debridement: Improved Severity of Tissue Post Debridement: Fat layer exposed Post Procedure Diagnosis Same as Pre-procedure Electronic Signature(s) Signed: 04/27/2023 4:37:17 PM By: Baltazar Najjar MD Entered By: Baltazar Najjar on 04/27/2023 14:18:00 -------------------------------------------------------------------------------- Debridement Details Patient Name: Date of Service: [name] Endow J.  04/27/2023 1:45 PM Medical Record Number: [id number] Patient Account Number: [id number] Date of Birth/Sex: Treating RN: 03/12/1956 (67 y.o. M) Primary Care Provider: Hillard Danker Other Clinician: Referring Provider: Treating Provider/Extender: Shara Blazing in Treatment: 7 Debridement Performed for Assessment: Wound #14 Left Calcaneus Performed By: Physician Maxwell Caul., MD The following information was scribed by: Cardell Peach, Dalene Seltzer ([id number]) [id number] Page 2 of 13 The information was scribed for: Baltazar Najjar Debridement Type: Debridement Level of Consciousness (Pre-procedure): Awake and Alert Pre-procedure Verification/Time Out Yes - 14:10 Taken: Start Time: 14:10 Pain Control: Lidocaine 5% topical ointment Percent of Wound Bed Debrided: 100% T Area Debrided (cm): otal 0.55 Tissue and other material debrided: Non-Viable, Slough, Slough Level: Non-Viable Tissue Debridement Description: Selective/Open Wound Instrument: Curette Bleeding: Minimum Hemostasis Achieved: Pressure Response to Treatment: Procedure was tolerated well Level of Consciousness (Post- Awake and Alert procedure): Post Debridement Measurements of Total Wound Length: (cm) 0.7 Width: (cm) 1 Depth: (cm) 0.1 Volume: (cm) 0.055 Character of Wound/Ulcer Post Debridement: Improved Post Procedure Diagnosis Same as Pre-procedure Electronic Signature(s) Signed: 04/27/2023 4:37:17 PM By: Baltazar Najjar MD Entered By: Baltazar Najjar on 04/27/2023 14:18:10 -------------------------------------------------------------------------------- Debridement Details Patient Name: Date of Service: [name] Endow J. 04/27/2023 1:45 PM Medical Record Number: [id number] Patient Account Number: [id number] Date of Birth/Sex: Treating RN: [birth year] (67 y.o. M) Primary Care Provider: Hillard Danker Other Clinician: Referring  Provider: Treating Provider/Extender: Shara Blazing in Treatment: 7 Debridement Performed for Assessment: Wound #15 Right,Anterior Lower Leg Performed By: Physician Maxwell Caul., MD The following information was scribed by: Redmond Pulling The information was scribed for: Baltazar Najjar Debridement Type: Debridement Severity of Tissue Pre Debridement: Fat layer exposed Level of Consciousness (Pre-procedure): Awake and Alert Pre-procedure Verification/Time Out Yes - 14:10 Taken: Start Time: 14:10 Pain Control: Lidocaine 5% topical ointment Percent of Wound Bed Debrided: 100% T Area Debrided (cm): otal 1.18 Tissue and other material debrided: Non-Viable, Slough, Slough Level: Non-Viable Tissue Debridement Description: Selective/Open Wound Instrument: Curette Bleeding: Minimum Hemostasis Achieved: Pressure Response to Treatment: Procedure  was tolerated well Level of Consciousness (Post- Awake and Alert procedure): Post Debridement Measurements of Total Wound Length: (cm) 1.5 Width: (cm) 1 Fauble, Brnadon J ([id number]) [id number] Page 3 of 13 Depth: (cm) 0.1 Volume: (cm) 0.118 Character of Wound/Ulcer Post Debridement: Improved Severity of Tissue Post Debridement: Fat layer exposed Post Procedure Diagnosis Same as Pre-procedure Electronic Signature(s) Signed: 04/27/2023 4:37:17 PM By: Baltazar Najjar MD Entered By: Baltazar Najjar on 04/27/2023 14:18:21 -------------------------------------------------------------------------------- HPI Details Patient Name: Date of Service: [name] Asper NDY J. 04/27/2023 1:45 PM Medical Record Number: [id number] Patient Account Number: [id number] Date of Birth/Sex: Treating RN: 11-03-1955 (67 y.o. M) Primary Care Provider: Hillard Danker Other Clinician: Referring Provider: Treating Provider/Extender: Shara Blazing in Treatment: 7 History of  Present Illness HPI Description: ADMISSION 07/29/2018 Mr. Krukowski is a 67 year old man with either prediabetes or diabetes he is on glipizide. In late October to November 2019 he noted a scabbed area on the back of his right calf. He picked this off a few times but it would not heal. He saw his primary physician on 12/5. It was felt at that time that this may actually heal on its own with conservative management. The next visit was on 07/20/2018 noted the area was a lot worse and arranged for his treatment here. He has been topical antibiotics like Neosporin although he stopped using this when the wound looked worse. He is just been covering this with a clean Band-Aid. He is given Bactrim a week ago and he is finishing this currently. It is made some improvement in the surrounding erythema per the patient. He does not have a history of nonhealing wounds. No prior history of wounds on his legs that he had difficulty healing. No prior skin issues. 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 Past medical history; A. fib status post ablation and recent cardioversion he is on Xarelto. He has a history of systolic heart failure, cervical radiculopathy, cardiomyopathy and type 2 diabetes as discussed ABI in the right leg was noncompressible on the right 2/20; the biopsy I did on the patient last week was negative for malignancy. Culture grew Pseudomonas and methicillin sensitive staph aureus. He is on cefdinir 300 twice a day. I will have to make this a 10 day course. He put him in compression. He states that the redness pain and erythema are a lot better. I suspect the patient has chronic venous insufficiency probably with a secondary cellulitis 2/27; arrives today with copious amounts of drainage coming out of the wound irritating the skin below the wound area. He only renewed the final 3 days of cefdinir today 3/5; came in for a nurse change 3 days ago. Again a lot of drainage noted. Zinc oxide  was applied unfortunately the drainage appears to a pool then he has a string of superficial open areas extending down into the Achilles area and some just below the wound. The wound itself does not look too bad. He is completed the antibiotics although apparently there was separation from the original 7 with the last 3 days. Culture grew a few MSSA and a few Pseudomonas 3/12; not too much difference over the last week. He came in for a dressing change on Monday by our nursing staff. Necrotic debris again over the wound surface. The entire area looks irritated but nontender and I do not think shows obvious evidence of infection. We have not heard anything about the reflux studies. Also notable than in this diabetic  man we had noncompressible vessels and although I can feel his pulses easily in his feet I will order arterial studies as well. 3/19-Patient had experience more pain has been dressed with a silver alginate, the wounds all have necrotic debris, very friable with easy bleeding with any kind of surface debridement including with gauze and Anasept and with a #3 curette. His vascular appointments unfortunately were all canceled on account of the virus outbreak resulting in studies only being done for emergent cases. His pulses are easily palpable in the lower extremity. He has open areas below the primary wound where he states the drainage which included purulent material also with some blood pooled and caused breakdown. 3/26; the patient was changed to Santyl with [facility] backing last week. This was largely because the silver alginate was sticking to the wound. He is still having quite a bit of pain. He tells [place] that the reflux studies and arterial studies we had attempted to arrange through vein and vascular are not being booked until mid May. Culture that was done last week showed both Serratia moderate and a few methicillin sensitive staph aureus I started him on Bactrim DS 1 p.o.  twice daily on 3/23 for 7 days. He is here for follow-up. 4/3; patient is on Santyl with Hydrofera Blue. Patient complains of pain. Our intake nurse is noted drainage and some odor. He has completed Bactrim recently for Serratia and a few methicillin sensitive staph aureus. After our staff push hard last week we were able to get venous studies as well as arterial studies. His arterial studies showed on the right great toe pressure of 0.86. On the left ABI at 1.47 TBI of 0.87. On both sides waveforms were triphasic VENOUS STUDIES showed reflux in the femoral vein, popliteal vein great saphenous vein at the saphenofemoral junction and great saphenous vein at the proximal thigh. Also noted to have age-indeterminate superficial vein thrombosis involving the small saphenous vein. Notable that the patient is already on Xarelto for atrial fibrillation. 4/9; the patient has been to see vascular surgery and reviewed by Dr. Durwin Nora. He was felt to have only minimal superficial venous reflux in the right great greater saphenous vein. For this reason he was not felt to be a candidate for laser ablation of the right greater saphenous vein. A wedge cushion was suggested to keep his leg elevated. He does have deep venous reflux on the right. Culture I did last time showed a combination of Serratia Pseudomonas and methicillin sensitive staph aureus. We have been using Hydrofera Blue to the large wound, Santyl to some of the deep punched out satellite lesions distal to it. There is some improvement SHEA, DURO ([id number]) [phone].pdf Page 4 of 13 4/16; the application for Apligraf was put out for further review for material that we have resubmitted. He has the deep area on the right posterior calf which is large and then 3 small punched out areas beneath this. In general his wounds look a lot better 4/23; the patient has his large original wound and then 2 punched out satellite lesions  below it on the right posterior calf. I was usually able to use Apligraf #1 to cover the full surface area of the larger wound. We continued with Santyl and Hydrofera Blue on the 2 punched-out areas 5/7; patient has his large original wound and 2 punched out satellite lesions below it in the right posterior calf. Apligraf #2 today. Major improvement in the big wound in terms of  wound depth. Punched-out [place] wounds have a very healthy looking wound bed. 5/21-Patient comes back for his large right posterior calf wound for which she has been an Apligraf #3 today. Wound depth seems to be better, the punched-out wounds appear to have healthy bed with some bleeding 6/4; right posterior calf wound is much better. Apligraf #4 today. The major wound has no wound depth at all. Only 1 of the satellite lesions is still open. The patient has very high blood pressure coming in today with a diastolic blood pressure of 130 after lying there for a few minutes it was down to 110. He states that is running 100 210 diastolic at home. He was high last week as well I thought he was on 50 mg 1/2 tablet twice daily of losartan I told him to double up on that however it turns out he is actually on 50 twice daily. Course he is run out of his medications prematurely. He has a follow-up with his primary's office next Wednesday. 6/18; patient's wounds continue to improve. The small area laterally is just about closed and there is continued epithelialization on the area on the posterior calf. 7/2; we applied his last Apligraf last visit. Early the next week there was a lot of purulent looking drainage that I cultured this grew staph and Pseudomonas however when we had him back for the next visit to 3 days later everything looked a lot better. He did not receive systemic antibiotics. Fortunately we did not have to remove the Apligraf. He has had heart trouble this week he was having an ablation apparently they have discovered a  clot in his left atrium they have changed all his anticoagulants as well as his blood pressure medication. 7/16; using Hydrofera Blue. We are making nice progress towards closure. He has not yet ordered his compression stockings he has his measurements 7/23; dimensions are better. He has a new satellite area superiorly. Both wounds cauterized with silver nitrate. 7/30; absolutely no change in the major wound on the posterior part of the right calf. We have been using Hydrofera Blue for a prolonged period of time and really had a nice improvement but over the last few weeks this is stalled. There is no depth. He arrived in clinic today with a new area on the right anterior tibia which apparently was an "ingrown hair". It is clearly an open area. This looks like a wrap injury although he was not really aware of it. 8/31; since the patient was last in clinic he was admitted to hospital from 8/17 through 8/22. He had had multiple falls at home including one off a ladder. He was admitted to hospital with mild COVID-19 viral pneumonia. Noted to be hyperkalemic and in acute renal failure. He has chronic systolic heart failure with ejection fraction of 35%. He arrives back in clinic with the original wound on the posterior aspect of the right lower calf looking a lot better. He has eschared areas from falls and lacerations on his anterior knees bilaterally just below the patella and on the mid part of his tibia bilaterally. He seems to have a small area on the right lateral ankle as well 9/10- Patient comes to clinic with a 2 new wounds one on the left anterior shin and one on the right posterior leg both the left knee and right knee areas are healed up. We have been using Santyl to the left anterior leg and silver alginate to right posterior leg wounds. 9/17; the  patient is original wound looks good on the posterior right calf. He has traumatic areas on the left anterior shin left anterior patellar tendon  and the right mid tibia area. Except for the right posterior calf all required debridement. We have been using Santyl on these areas and silver alginate posteriorly right calf 9/24; the original wound on the posterior calf on the right looks good. This is healthy. There are traumatic wounds in the left anterior shin, left anterior patella in the right mid tibia area are about the same. He has dusky erythema on the left anterior tibia which led me to give him antibiotics last week. This does not look too much different. This is nontender and I wonder if this is all just venous inflammation. 10/2; the patient has 2 open areas on the right posterior calf both of these look good we have been using silver alginate. He has traumatic wounds on the left and right mid tibia areas. The surface of these wounds is still not ready for closure. We have been using Iodoflex. Finally has areas on the left knee. The latter wounds are all traumatic after a fall 10/9. His original wounds of the right posterior calf are superficial and progressing towards closure. His traumatic wounds on the left anterior tibia and right anterior tibia are considerably improved in terms of the wound surface. He asked me about a skin graft in these areas I do not think this is going to be necessary. Change from Iodosorb to [facility]. The area on the left knee is just about closed as well 10/16; his original wound on the right posterior calf very superficial looks healthy. He has more recent traumatic wounds on the left anterior tibia and right anterior tibia both of these have better looking surfaces and measuring smaller. Might be beneficial for an advanced treatment product. The area on his left knee has a small superficial scab and we are going to close that when 10/23; his original wound areas on the right posterior calf continue to improve. The more recently traumatic wounds on the left anterior tibia and right anterior tibia  both look better in terms of surfaces. No debridement required. We have been using Hydrofera Blue. He is approved for Apligraf 11/3; Apligraf #1 applied to the left anterior tibia right anterior tibia and to 2 small areas remaining on the right posterior calf which were part of his initial wound area. 11/19; Apligraf #2. Left anterior tibia is healed. Right anterior tibia much better. Only one small area remains on the right posterior calf which was part of his initial wound area 12/3; the left anterior tibia has opened up again. Hyper granulated nodule. Right anterior tibia is superficial and larger. There is no depth of this. I did not place Apligraf today return to [facility] under compression 12/10; the patient has 2 remaining wounds 1 on the right posterior and the other on the left anterior. Both of these look quite good. No debridement is required we have been using Hydrofera Blue under compression. 12/17; the patient has really not closed over which is disappointing. His wraps were very tight and there may be some wrap injury issues. On the left he has the original wound on the medial mid tibia he has a wrap injury on the lateral mid tibia. On the right his posterior areas are superficial but certainly not closed 12/31; the patient has totally closed on the left. He will transition to his own 20/30 below-knee stocking on the  left leg today. Interestingly on the right posterior calf the 2 wounds that he had from last time of closed however there is an additional injury in this same area. Almost looks as though there is subcutaneous fluid/blistering. There is no evidence of cellulitis this does not seem tender 06/24/2019; the patient comes in having been placed in his 20/30 stockings on the left leg last week. He states that he noted blisters break open on Saturday and they had opened into wounds on the anterior tibial area on the left by Monday. The area on the posterior right leg looks  satisfactory we are still doing that and compression. We are using calcium alginate to all wound areas 1/14; the patient had juxta light however one of them was not the right size which will need to be returned. The new area last week on the left anterior tibia looks a lot better it measures smaller I would say the area on the right is about the same, this is on the right posterior calf. We have been using silver alginate 1/21; he has his bilateral juxta lites however the left anterior wound continues to look better where is the 2 areas on the right posterior calf look about the same. We have been using Hydrofera Blue under compression with the juxta lite stockings now on standby 2/4; the areas on the left are totally healed and he will be discharged into his own stocking/juxta light. On the right posterior calf he has what looks to be a blister with a small area of subdermal hemorrhage this is very tiny but I did I am going to wrap him today and see if this will be reabsorbed by next week 2/11; he comes in this week with expanded wounds on the posterior calf as well as a new wound on the left anterior. He said the left anterior started a few days ago with a blister that is now surface. He is using his juxta lite stockings at a pressure of 30/40 2/19; we wrapped his left leg last week after a failed attempt to discharge that leg and his juxta lites 2 weeks ago. The leg is again healed he has superficial areas posteriorly on the right leg 2/26; the patient came in initially with right posterior calf wound secondary to minor trauma with secondary infection. He went on to develop a left lower leg wound. In the interim he had congestive heart failure with acute renal failure a fall with bilateral anterior leg wounds and finally Covid requiring admission to pneumonia with pneumonia. He is finally closed. He has external compression stockings bilaterally [juxta lite stockings]. His wounds are  healed READMISSION 03/03/2023 This is a patient that we had here for a year from 20 20 through 20 21 with a sizable wound on the right posterior calf which was secondary to a combination of KOLEMAN, MCKINNIES ([id number]) [phone].pdf Page 5 of 13 chronic venous insufficiency and secondary infection in the wound when he came here. This was a problematic wound in itself however he developed traumatic wounds on his lower extremities after a fall from a ladder during the same admission. Eventually he was healed out and discharged and juxta lite stockings. He tells me that everything was fine up until about 2-1/2 months ago he developed a blister and an open area on the left posterior calf this time. He saw his primary doctor and received a course of Bactrim. He has been using Silvadene with AandE ointment and covering this with gauze.  He is here for review of this area. When he was here in 2021 he also saw Dr. Woodfin Ganja of vein and vascular he had venous reflux studies that showed deep venous reflux but nothing that was amenable to surgery. He also had arterial studies. He was noncompressible on the left but had normal TBIs and triphasic waveforms. In terms of the reflux studies I am not really sure whether the left leg was involved in that study or not we will need to review that. His past medical history includes type type 2 diabetes, chronic venous insufficiency with secondary lymphedema, intermittent A-fib, nonischemic cardiomyopathy, thrombus of the left atrial appendage, hereditary hemochromatosis cytosis. He is now on Coumadin presumably for the atrial fibrillation. ABIs in this clinic were not repeated 9/24; posterior left calf wound. We used Hydrofera Blue under compression. The wound looks better this week better looking surface. 10/1. Posterior left calf wound secondary to venous insufficiency. He was new to the clinic in 2 weeks ago at which time I thought he was  going to require a difficult set of mechanical debridements however the wound surface is continuing to improve using Hydrofera Blue although the dimensions not so remarkably improved today 10/8; posterior left calf wound secondary to chronic venous insufficiency. He continues to think nice progress here on this wound the wound is smaller better looking surface. He arrived in clinic today with an abrasion on his Achilles area on the left leg as well as what looked to be a potential deep tissue injury on the lateral lateral part of the left foot at the level of the base of the fifth metatarsal. 10/15 posterior left calf wound continues to improve. We have been using Prisma under ABDs under Urgo K2 lite's. The areas on the Achilles and the left lateral foot which were concerning last time of maintain skin integrity. He is not wearing stockings even on the right leg I talked to him about this. 10/22; posterior left calf wound measures slightly larger today. The abrasion injury in the Achilles send on the lateral fifth metatarsal base look improved. We have been using Prisma under compression I am going to change this to [facility] today 10/29; posterior left calf wound continues to progress nicely. Presumably an abrasion injury on the Achilles heel. I changed dressing to Hydrofera Blue. Will need to dress the area on the Achilles today. The area on the fifth metatarsal base is not open 11/5; posterior left calf wound continues to improve nicely presumably an abrasion injury on the left Achilles also looks clean. He has a what looks to be an abrasion on the base of the fifth metatarsal it is not open but it looks as though there is been some friction on this area we will pad this today under the wrap. Patient with chronic venous insufficiency severe hemosiderin deposition and resultant wounds. He has been in the clinic before however that was for a wound on the right posterior calf. Notable he is not  wearing stockings even on the uninvolved right leg today 11/11; his original wound on the posterior left calf continues to improve in dimensions. Once again he comes in today with some black eschar around the edge I wonder if this is bleeding. He has the area on the Achilles and now a new area on the left upper lower leg which he says he scratched. And finally he has an area on the right anterior mid tibia which he said he got from taking off his  stocking Electronic Signature(s) Signed: 04/27/2023 4:37:17 PM By: Baltazar Najjar MD Entered By: Baltazar Najjar on 04/27/2023 14:19:41 -------------------------------------------------------------------------------- Physical Exam Details Patient Name: Date of Service: [name] 04/27/2023 1:45 PM Medical Record Number: [id number] Patient Account Number: [id number] Date of Birth/Sex: Treating RN: 1956-05-01 (67 y.o. M) Primary Care Provider: Hillard Danker Other Clinician: Referring Provider: Treating Provider/Extender: Shara Blazing in Treatment: 7 Constitutional Sitting or standing Blood Pressure is within target range for patient.. Pulse regular and within target range for patient.[name] Respirations regular, non-labored and within target range.. Temperature is normal and within the target range for the patient.[name] Appears in no distress. Notes Wound exam; left posterior calf granulation looks healthy and advancing epithelialization certainly smaller he had some surrounding black eschar which may be dried blood I am not certain. This was removed with an open curette. Left Achilles also dry skin flaking eschar removed with a #3 curette. He has a new wound on the left upper calf which he says he scratched. This is clean Right anterior tibia new wound. He says he got this taking off his compression stockings completely eschar covered I used a #3 curette to remove this. Electronic Signature(s) Signed: 04/27/2023  4:37:17 PM By: Baltazar Najjar MD Entered By: Baltazar Najjar on 04/27/2023 14:21:16 [name] ([id number]) [id number] Page 6 of 13 -------------------------------------------------------------------------------- Physician Orders Details Patient Name: Date of Service: MIDDLETON, DAQUINO 04/27/2023 1:45 PM Medical Record Number: [id number] Patient Account Number: [id number] Date of Birth/Sex: Treating RN: [birth year] (67 y.o. [name] Primary Care Provider: Hillard Danker Other Clinician: Referring Provider: Treating Provider/Extender: Shara Blazing in Treatment: 7 Verbal / Phone Orders: No Diagnosis Coding Follow-up Appointments ppointment in 1 week. - Tuesday 05/05/23 @ 1:15 w/ Dr. Leanord Hawking Return A Anesthetic (In clinic) Topical Lidocaine 4% applied to wound bed Bathing/ Shower/ Hygiene May shower with protection but do not get wound dressing(s) wet. Protect dressing(s) with water repellant cover (for example, large plastic bag) or a cast cover and may then take shower. Wound Treatment Wound #13 - Lower Leg Wound Laterality: Left, Posterior Cleanser: Soap and Water 1 x Per Week/15 Days Discharge Instructions: May shower and wash wound with dial antibacterial soap and water prior to dressing change. Cleanser: Vashe 5.8 (oz) 1 x Per Week/15 Days Discharge Instructions: Cleanse the wound with Vashe prior to applying a clean dressing using gauze sponges, not tissue or cotton balls. Peri-Wound Care: Sween Lotion (Moisturizing lotion) 1 x Per Week/15 Days Discharge Instructions: Apply moisturizing lotion as directed Prim Dressing: Hydrofera Blue Ready Transfer Foam, 4x5 (in/in) 1 x Per Week/15 Days ary Discharge Instructions: Apply to wound bed as instructed Secondary Dressing: ABD Pad, 8x10 1 x Per Week/15 Days Discharge Instructions: Apply over primary dressing as directed. Secondary Dressing: Optifoam  Non-Adhesive Dressing, 4x4 in 1 x Per Week/15 Days Discharge Instructions: Apply to ankle, achilles, lateral part of foot for protection Compression Wrap: Urgo K2, (equivalent to a 4 layer) two layer compression system, regular 1 x Per Week/15 Days Discharge Instructions: Apply Urgo K2 as directed (alternative to 4 layer compression). Wound #14 - Calcaneus Wound Laterality: Left Cleanser: Soap and Water 1 x Per Week/15 Days Discharge Instructions: May shower and wash wound with dial antibacterial soap and water prior to dressing change. Cleanser: Vashe 5.8 (oz) 1 x Per Week/15 Days Discharge Instructions: Cleanse the wound with Vashe prior to applying a clean dressing using gauze sponges, not tissue or cotton balls. Peri-Wound Care: Manuela Schwartz  Lotion (Moisturizing lotion) 1 x Per Week/15 Days Discharge Instructions: Apply moisturizing lotion as directed Prim Dressing: Hydrofera Blue Ready Transfer Foam, 4x5 (in/in) 1 x Per Week/15 Days ary Discharge Instructions: Apply to wound bed as instructed Secondary Dressing: ABD Pad, 8x10 1 x Per Week/15 Days Discharge Instructions: Apply over primary dressing as directed. Secondary Dressing: Optifoam Non-Adhesive Dressing, 4x4 in 1 x Per Week/15 Days Discharge Instructions: Apply to ankle, achilles, lateral part of foot for protection Compression Wrap: Urgo K2, (equivalent to a 4 layer) two layer compression system, regular 1 x Per Week/15 Days Discharge Instructions: Apply Urgo K2 as directed (alternative to 4 layer compression). Wound #15 - Lower Leg Wound Laterality: Right, Anterior Cleanser: Soap and Water 1 x Per Week/15 Days STACE, CLOHESSY ([id number]) [id number] Page 7 of 13 Discharge Instructions: May shower and wash wound with dial antibacterial soap and water prior to dressing change. Cleanser: Vashe 5.8 (oz) 1 x Per Week/15 Days Discharge Instructions: Cleanse the wound with Vashe prior to applying a clean dressing  using gauze sponges, not tissue or cotton balls. Peri-Wound Care: Sween Lotion (Moisturizing lotion) 1 x Per Week/15 Days Discharge Instructions: Apply moisturizing lotion as directed Prim Dressing: Hydrofera Blue Ready Transfer Foam, 4x5 (in/in) 1 x Per Week/15 Days ary Discharge Instructions: Apply to wound bed as instructed Secondary Dressing: ABD Pad, 8x10 1 x Per Week/15 Days Discharge Instructions: Apply over primary dressing as directed. Secondary Dressing: Optifoam Non-Adhesive Dressing, 4x4 in 1 x Per Week/15 Days Discharge Instructions: Apply to ankle, achilles, lateral part of foot for protection Compression Wrap: Urgo K2, (equivalent to a 4 layer) two layer compression system, regular 1 x Per Week/15 Days Discharge Instructions: Apply Urgo K2 as directed (alternative to 4 layer compression). Wound #16 - Lower Leg Wound Laterality: Left, Lateral Cleanser: Soap and Water 1 x Per Week/15 Days Discharge Instructions: May shower and wash wound with dial antibacterial soap and water prior to dressing change. Cleanser: Vashe 5.8 (oz) 1 x Per Week/15 Days Discharge Instructions: Cleanse the wound with Vashe prior to applying a clean dressing using gauze sponges, not tissue or cotton balls. Peri-Wound Care: Sween Lotion (Moisturizing lotion) 1 x Per Week/15 Days Discharge Instructions: Apply moisturizing lotion as directed Prim Dressing: Hydrofera Blue Ready Transfer Foam, 4x5 (in/in) 1 x Per Week/15 Days ary Discharge Instructions: Apply to wound bed as instructed Secondary Dressing: ABD Pad, 8x10 1 x Per Week/15 Days Discharge Instructions: Apply over primary dressing as directed. Secondary Dressing: Optifoam Non-Adhesive Dressing, 4x4 in 1 x Per Week/15 Days Discharge Instructions: Apply to ankle, achilles, lateral part of foot for protection Compression Wrap: Urgo K2, (equivalent to a 4 layer) two layer compression system, regular 1 x Per Week/15 Days Discharge Instructions: Apply  Urgo K2 as directed (alternative to 4 layer compression). Patient Medications llergies: No Known Allergies A Notifications Medication Indication Start End 04/27/2023 lidocaine DOSE topical 5 % ointment - ointment topical once daily Electronic Signature(s) Signed: 04/27/2023 4:37:17 PM By: Baltazar Najjar MD Signed: 04/27/2023 5:15:15 PM By: Redmond Pulling RN, BSN Entered By: Redmond Pulling on 04/27/2023 14:17:04 -------------------------------------------------------------------------------- Problem List Details Patient Name: Date of Service: [name] 04/27/2023 1:45 PM Medical Record Number: [id number] Patient Account Number: [id number] Date of Birth/Sex: Treating RN: 03-28-1956 (67 y.o. 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 04/27/2023 1:45 PM Medical Record  Number: [id number] Patient Account Number: [id number] Date of Birth/Sex: Treating RN: 29-Dec-1955 (67 y.o. M) Primary Care Provider: Hillard Danker Other Clinician: Referring Provider: Treating Provider/Extender: Shara Blazing in Treatment: 7 Subjective History of Present Illness (HPI) ADMISSION 07/29/2018 Mr. Klaus is a 67 year old man with either prediabetes or diabetes he is on glipizide. In late October to November 2019 he noted a scabbed area on the back of his right calf. He picked this off a few times but it would not heal. He saw his primary physician on 12/5. It was felt at that time that this may actually heal on its own with conservative management. The next visit was on 07/20/2018 noted the area was a lot worse and arranged for his treatment here. He has been topical antibiotics like Neosporin although he stopped using this when the wound looked worse. He is just been covering this with a clean Band-Aid. He is given Bactrim a week ago and he is finishing this currently. It is made some improvement in the surrounding erythema per the patient. He does not have a history of nonhealing wounds. No prior history of wounds on his legs that he had difficulty healing. No prior skin issues. 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 Past medical history; A. fib status post ablation and recent cardioversion he is on Xarelto. He has a history of systolic heart failure, cervical radiculopathy, cardiomyopathy and type 2 diabetes as discussed ABI in the right leg was noncompressible on the right 2/20; the biopsy I did on the patient last week was negative for malignancy. Culture grew Pseudomonas and methicillin sensitive staph aureus. He is on cefdinir 300 twice a day. I will have to make this a 10 day course. He put him in compression. He states that the redness pain and erythema are a lot better. I suspect the patient has chronic venous insufficiency  probably with a secondary cellulitis 2/27; arrives today with copious amounts of drainage coming out of the wound irritating the skin below the wound area. He only renewed the final 3 days of cefdinir today 3/5; came in for a nurse change 3 days ago. Again a lot of drainage noted. Zinc oxide was applied unfortunately the drainage appears to a pool then he has a BRAIDEN, KLINGSPORN ([id number]) [id number] Page 9 of 13 string of superficial open areas extending down into the Achilles area and some just below the wound. The wound itself does not look too bad. He is completed the antibiotics although apparently there was separation from the original 7 with the last 3 days. Culture grew a few MSSA and a few Pseudomonas 3/12; not too much difference over the last week. He came in for a dressing change on Monday by our nursing staff. Necrotic debris again over  the wound surface. The entire area looks irritated but nontender and I do not think shows obvious evidence of infection. We have not heard anything about the reflux studies. Also notable than in this diabetic man we had noncompressible vessels and although I can feel his pulses easily in his feet I will order arterial studies as well. 3/19-Patient had experience more pain has been dressed with a silver alginate, the wounds all have necrotic debris, very friable with easy bleeding with any kind of surface debridement including with gauze and Anasept and with a #3 curette. His vascular appointments unfortunately were all canceled on account of the virus outbreak resulting in studies only being done for emergent cases. His pulses are easily palpable in the lower extremity. He has open areas below the primary wound where he states the drainage which included purulent material also with some blood pooled and caused breakdown. 3/26; the patient was changed to Santyl with [facility] backing last week. This was largely because the  silver alginate was sticking to the wound. He is still having quite a bit of pain. He tells [place] that the reflux studies and arterial studies we had attempted to arrange through vein and vascular are not being booked until mid May. Culture that was done last week showed both Serratia moderate and a few methicillin sensitive staph aureus I started him on Bactrim DS 1 p.o. twice daily on 3/23 for 7 days. He is here for follow-up. 4/3; patient is on Santyl with Hydrofera Blue. Patient complains of pain. Our intake nurse is noted drainage and some odor. He has completed Bactrim recently for Serratia and a few methicillin sensitive staph aureus. After our staff push hard last week we were able to get venous studies as well as arterial studies. His arterial studies showed on the right great toe pressure of 0.86. On the left ABI at 1.47 TBI of 0.87. On both sides waveforms were triphasic VENOUS STUDIES showed reflux in the femoral vein, popliteal vein great saphenous vein at the saphenofemoral junction and great saphenous vein at the proximal thigh. Also noted to have age-indeterminate superficial vein thrombosis involving the small saphenous vein. Notable that the patient is already on Xarelto for atrial fibrillation. 4/9; the patient has been to see vascular surgery and reviewed by Dr. Durwin Nora. He was felt to have only minimal superficial venous reflux in the right great greater saphenous vein. For this reason he was not felt to be a candidate for laser ablation of the right greater saphenous vein. A wedge cushion was suggested to keep his leg elevated. He does have deep venous reflux on the right. Culture I did last time showed a combination of Serratia Pseudomonas and methicillin sensitive staph aureus. We have been using Hydrofera Blue to the large wound, Santyl to some of the deep punched out satellite lesions distal to it. There is some improvement 4/16; the application for Apligraf was put out for  further review for material that we have resubmitted. He has the deep area on the right posterior calf which is large and then 3 small punched out areas beneath this. In general his wounds look a lot better 4/23; the patient has his large original wound and then 2 punched out satellite lesions below it on the right posterior calf. I was usually able to use Apligraf #1 to cover the full surface area of the larger wound. We continued with Santyl and Hydrofera Blue on the 2 punched-out areas 5/7; patient has his large original  wound and 2 punched out satellite lesions below it in the right posterior calf. Apligraf #2 today. Major improvement in the big wound in terms of wound depth. Punched-out Warren wounds have a very healthy looking wound bed. 5/21-Patient comes back for his large right posterior calf wound for which she has been an Apligraf #3 today. Wound depth seems to be better, the punched-out wounds appear to have healthy bed with some bleeding 6/4; right posterior calf wound is much better. Apligraf #4 today. The major wound has no wound depth at all. Only 1 of the satellite lesions is still open. The patient has very high blood pressure coming in today with a diastolic blood pressure of 130 after lying there for a few minutes it was down to 110. He states that is running 100 210 diastolic at home. He was high last week as well I thought he was on 50 mg 1/2 tablet twice daily of losartan I told him to double up on that however it turns out he is actually on 50 twice daily. Course he is run out of his medications prematurely. He has a follow-up with his primary's office next Wednesday. 6/18; patient's wounds continue to improve. The small area laterally is just about closed and there is continued epithelialization on the area on the posterior calf. 7/2; we applied his last Apligraf last visit. Early the next week there was a lot of purulent looking drainage that I cultured this grew staph and  Pseudomonas however when we had him back for the next visit to 3 days later everything looked a lot better. He did not receive systemic antibiotics. Fortunately we did not have to remove the Apligraf. He has had heart trouble this week he was having an ablation apparently they have discovered a clot in his left atrium they have changed all his anticoagulants as well as his blood pressure medication. 7/16; using Hydrofera Blue. We are making nice progress towards closure. He has not yet ordered his compression stockings he has his measurements 7/23; dimensions are better. He has a new satellite area superiorly. Both wounds cauterized with silver nitrate. 7/30; absolutely no change in the major wound on the posterior part of the right calf. We have been using Hydrofera Blue for a prolonged period of time and really had a nice improvement but over the last few weeks this is stalled. There is no depth. He arrived in clinic today with a new area on the right anterior tibia which apparently was an "ingrown hair". It is clearly an open area. This looks like a wrap injury although he was not really aware of it. 8/31; since the patient was last in clinic he was admitted to hospital from 8/17 through 8/22. He had had multiple falls at home including one off a ladder. He was admitted to hospital with mild COVID-19 viral pneumonia. Noted to be hyperkalemic and in acute renal failure. He has chronic systolic heart failure with ejection fraction of 35%. He arrives back in clinic with the original wound on the posterior aspect of the right lower calf looking a lot better. He has eschared areas from falls and lacerations on his anterior knees bilaterally just below the patella and on the mid part of his tibia bilaterally. He seems to have a small area on the right lateral ankle as well 9/10- Patient comes to clinic with a 2 new wounds one on the left anterior shin and one on the right posterior leg both the left  knee  and right knee areas are healed up. We have been using Santyl to the left anterior leg and silver alginate to right posterior leg wounds. 9/17; the patient is original wound looks good on the posterior right calf. He has traumatic areas on the left anterior shin left anterior patellar tendon and the right mid tibia area. Except for the right posterior calf all required debridement. We have been using Santyl on these areas and silver alginate posteriorly right calf 9/24; the original wound on the posterior calf on the right looks good. This is healthy. There are traumatic wounds in the left anterior shin, left anterior patella in the right mid tibia area are about the same. He has dusky erythema on the left anterior tibia which led me to give him antibiotics last week. This does not look too much different. This is nontender and I wonder if this is all just venous inflammation. 10/2; the patient has 2 open areas on the right posterior calf both of these look good we have been using silver alginate. He has traumatic wounds on the left and right mid tibia areas. The surface of these wounds is still not ready for closure. We have been using Iodoflex. Finally has areas on the left knee. The latter wounds are all traumatic after a fall 10/9. His original wounds of the right posterior calf are superficial and progressing towards closure. His traumatic wounds on the left anterior tibia and right anterior tibia are considerably improved in terms of the wound surface. He asked me about a skin graft in these areas I do not think this is going to be necessary. Change from Iodosorb to [facility]. The area on the left knee is just about closed as well 10/16; his original wound on the right posterior calf very superficial looks healthy. He has more recent traumatic wounds on the left anterior tibia and right anterior tibia both of these have better looking surfaces and measuring smaller. Might be  beneficial for an advanced treatment product. The area on his left knee has a small superficial scab and we are going to close that when 10/23; his original wound areas on the right posterior calf continue to improve. The more recently traumatic wounds on the left anterior tibia and right anterior tibia both look better in terms of surfaces. No debridement required. We have been using Hydrofera Blue. He is approved for Apligraf 11/3; Apligraf #1 applied to the left anterior tibia right anterior tibia and to 2 small areas remaining on the right posterior calf which were part of his initial wound area. 11/19; Apligraf #2. Left anterior tibia is healed. Right anterior tibia much better. Only one small area remains on the right posterior calf which was part of his initial wound area 12/3; the left anterior tibia has opened up again. Hyper granulated nodule. Right anterior tibia is superficial and larger. There is no depth of this. I did not place Apligraf today return to [facility] under compression 12/10; the patient has 2 remaining wounds 1 on the right posterior and the other on the left anterior. Both of these look quite good. No debridement is required HALE, HARTIGAN ([id number]) [phone].pdf Page 10 of 13 we have been using Hydrofera Blue under compression. 12/17; the patient has really not closed over which is disappointing. His wraps were very tight and there may be some wrap injury issues. On the left he has the original wound on the medial mid tibia he has a wrap injury on the  lateral mid tibia. On the right his posterior areas are superficial but certainly not closed 12/31; the patient has totally closed on the left. He will transition to his own 20/30 below-knee stocking on the left leg today. Interestingly on the right posterior calf the 2 wounds that he had from last time of closed however there is an additional injury in this same area. Almost looks as  though there is subcutaneous fluid/blistering. There is no evidence of cellulitis this does not seem tender 06/24/2019; the patient comes in having been placed in his 20/30 stockings on the left leg last week. He states that he noted blisters break open on Saturday and they had opened into wounds on the anterior tibial area on the left by Monday. The area on the posterior right leg looks satisfactory we are still doing that and compression. We are using calcium alginate to all wound areas 1/14; the patient had juxta light however one of them was not the right size which will need to be returned. The new area last week on the left anterior tibia looks a lot better it measures smaller I would say the area on the right is about the same, this is on the right posterior calf. We have been using silver alginate 1/21; he has his bilateral juxta lites however the left anterior wound continues to look better where is the 2 areas on the right posterior calf look about the same. We have been using Hydrofera Blue under compression with the juxta lite stockings now on standby 2/4; the areas on the left are totally healed and he will be discharged into his own stocking/juxta light. On the right posterior calf he has what looks to be a blister with a small area of subdermal hemorrhage this is very tiny but I did I am going to wrap him today and see if this will be reabsorbed by next week 2/11; he comes in this week with expanded wounds on the posterior calf as well as a new wound on the left anterior. He said the left anterior started a few days ago with a blister that is now surface. He is using his juxta lite stockings at a pressure of 30/40 2/19; we wrapped his left leg last week after a failed attempt to discharge that leg and his juxta lites 2 weeks ago. The leg is again healed he has superficial areas posteriorly on the right leg 2/26; the patient came in initially with right posterior calf wound secondary to  minor trauma with secondary infection. He went on to develop a left lower leg wound. In the interim he had congestive heart failure with acute renal failure a fall with bilateral anterior leg wounds and finally Covid requiring admission to pneumonia with pneumonia. He is finally closed. He has external compression stockings bilaterally [juxta lite stockings]. His wounds are healed READMISSION 03/03/2023 This is a patient that we had here for a year from 20 20 through 20 21 with a sizable wound on the right posterior calf which was secondary to a combination of chronic venous insufficiency and secondary infection in the wound when he came here. This was a problematic wound in itself however he developed traumatic wounds on his lower extremities after a fall from a ladder during the same admission. Eventually he was healed out and discharged and juxta lite stockings. He tells me that everything was fine up until about 2-1/2 months ago he developed a blister and an open area on the left posterior calf  this time. He saw his primary doctor and received a course of Bactrim. He has been using Silvadene with AandE ointment and covering this with gauze. He is here for review of this area. When he was here in 2021 he also saw Dr. Woodfin Ganja of vein and vascular he had venous reflux studies that showed deep venous reflux but nothing that was amenable to surgery. He also had arterial studies. He was noncompressible on the left but had normal TBIs and triphasic waveforms. In terms of the reflux studies I am not really sure whether the left leg was involved in that study or not we will need to review that. His past medical history includes type type 2 diabetes, chronic venous insufficiency with secondary lymphedema, intermittent A-fib, nonischemic cardiomyopathy, thrombus of the left atrial appendage, hereditary hemochromatosis cytosis. He is now on Coumadin presumably for the atrial fibrillation. ABIs in this  clinic were not repeated 9/24; posterior left calf wound. We used Hydrofera Blue under compression. The wound looks better this week better looking surface. 10/1. Posterior left calf wound secondary to venous insufficiency. He was new to the clinic in 2 weeks ago at which time I thought he was going to require a difficult set of mechanical debridements however the wound surface is continuing to improve using Hydrofera Blue although the dimensions not so remarkably improved today 10/8; posterior left calf wound secondary to chronic venous insufficiency. He continues to think nice progress here on this wound the wound is smaller better looking surface. He arrived in clinic today with an abrasion on his Achilles area on the left leg as well as what looked to be a potential deep tissue injury on the lateral lateral part of the left foot at the level of the base of the fifth metatarsal. 10/15 posterior left calf wound continues to improve. We have been using Prisma under ABDs under Urgo K2 lite's. The areas on the Achilles and the left lateral foot which were concerning last time of maintain skin integrity. He is not wearing stockings even on the right leg I talked to him about this. 10/22; posterior left calf wound measures slightly larger today. The abrasion injury in the Achilles send on the lateral fifth metatarsal base look improved. We have been using Prisma under compression I am going to change this to [facility] today 10/29; posterior left calf wound continues to progress nicely. Presumably an abrasion injury on the Achilles heel. I changed dressing to Hydrofera Blue. Will need to dress the area on the Achilles today. The area on the fifth metatarsal base is not open 11/5; posterior left calf wound continues to improve nicely presumably an abrasion injury on the left Achilles also looks clean. He has a what looks to be an abrasion on the base of the fifth metatarsal it is not open but it looks  as though there is been some friction on this area we will pad this today under the wrap. Patient with chronic venous insufficiency severe hemosiderin deposition and resultant wounds. He has been in the clinic before however that was for a wound on the right posterior calf. Notable he is not wearing stockings even on the uninvolved right leg today 11/11; his original wound on the posterior left calf continues to improve in dimensions. Once again he comes in today with some black eschar around the edge I wonder if this is bleeding. He has the area on the Achilles and now a new area on the left upper lower leg  which he says he scratched. And finally he has an area on the right anterior mid tibia which he said he got from taking off his stocking Objective Constitutional Sitting or standing Blood Pressure is within target range for patient.. Pulse regular and within target range for patient.[name] Respirations regular, non-labored and within target range.. Temperature is normal and within the target range for the patient.[name] Appears in no distress. Vitals Time Taken: 1:47 PM, Height: 73 in, Weight: 254 lbs, BMI: 33.5, Temperature: 98.2 F, Pulse: 77 bpm, Respiratory Rate: 18 breaths/min, Blood Pressure: 104/70 mmHg, Capillary Blood Glucose: 143 mg/dl. SAY, FRIPP ([id number]) [id number] Page 11 of 13 General Notes: Wound exam; left posterior calf granulation looks healthy and advancing epithelialization certainly smaller he had some surrounding black eschar which may be dried blood I am not certain. This was removed with an open curette. Left Achilles also dry skin flaking eschar removed with a #3 curette. He has a new wound on the left upper calf which he says he scratched. This is clean Right anterior tibia new wound. He says he got this taking off his compression stockings completely eschar covered I used a #3 curette to remove this. Integumentary (Hair, Skin) Wound #13  status is Open. Original cause of wound was Blister. The date acquired was: 01/20/2023. The wound has been in treatment 7 weeks. The wound is located on the Left,Posterior Lower Leg. The wound measures 2cm length x 1.5cm width x 0.1cm depth; 2.356cm^2 area and 0.236cm^3 volume. There is Fat Layer (Subcutaneous Tissue) exposed. There is no tunneling or undermining noted. There is a medium amount of serosanguineous drainage noted. The wound margin is distinct with the outline attached to the wound base. There is large (67-100%) red, friable granulation within the wound bed. There is no necrotic tissue within the wound bed. The periwound skin appearance exhibited: Hemosiderin Staining. The periwound skin appearance did not exhibit: Callus, Crepitus, Excoriation, Induration, Rash, Scarring, Dry/Scaly, Maceration, Atrophie Blanche, Cyanosis, Ecchymosis, Mottled, Pallor, Rubor, Erythema. Wound #14 status is Open. Original cause of wound was Blister. The date acquired was: 04/07/2023. The wound has been in treatment 1 weeks. The wound is located on the Left Calcaneus. The wound measures 0.7cm length x 1cm width x 0.1cm depth; 0.55cm^2 area and 0.055cm^3 volume. There is Fat Layer (Subcutaneous Tissue) exposed. There is no tunneling or undermining noted. There is a medium amount of serosanguineous drainage noted. There is no granulation within the wound bed. There is a large (67-100%) amount of necrotic tissue within the wound bed including Eschar. The periwound skin appearance did not exhibit: Callus, Crepitus, Excoriation, Induration, Rash, Scarring, Dry/Scaly, Maceration, Atrophie Blanche, Cyanosis, Ecchymosis, Hemosiderin Staining, Mottled, Pallor, Rubor, Erythema. Wound #15 status is Open. Original cause of wound was Trauma. The date acquired was: 02/25/2023. The wound is located on the Right,Anterior Lower Leg. The wound measures 1.5cm length x 1cm width x 0.2cm depth; 1.178cm^2 area and 0.236cm^3 volume.  There is no tunneling or undermining noted. There is a medium amount of serosanguineous drainage noted. There is no granulation within the wound bed. There is a large (67-100%) amount of necrotic tissue within the wound bed including Eschar and Adherent Slough. Wound #16 status is Open. Original cause of wound was Gradually Appeared. The date acquired was: 04/27/2023. The wound is located on the Left,Lateral Lower Leg. The wound measures 0.8cm length x 0.8cm width x 0.1cm depth; 0.503cm^2 area and 0.05cm^3 volume. There is no tunneling or undermining noted. There is a medium amount  of serosanguineous drainage noted. There is large (67-100%) red granulation within the wound bed. There is a small (1-33%) amount of necrotic tissue within the wound bed including Adherent Slough. Assessment Active Problems ICD-10 Non-pressure chronic ulcer of other part of left lower leg with other specified severity Chronic venous hypertension (idiopathic) with ulcer and inflammation of left lower extremity Non-pressure chronic ulcer of left ankle with other specified severity Type 2 diabetes mellitus with other skin ulcer Non-pressure chronic ulcer of other part of right lower leg with other specified severity Procedures Wound #13 Pre-procedure diagnosis of Wound #13 is a Venous Leg Ulcer located on the Left,Posterior Lower Leg .Severity of Tissue Pre Debridement is: Fat layer exposed. There was a Selective/Open Wound Non-Viable Tissue Debridement with a total area of 2.36 sq cm performed by Maxwell Caul., MD. With the following instrument(s): Curette to remove Non-Viable tissue/material. Material removed includes [facility] after achieving pain control using Lidocaine 5% topical ointment. No specimens were taken. A time out was conducted at 14:10, prior to the start of the procedure. A Minimum amount of bleeding was controlled with Pressure. The procedure was tolerated well. Post Debridement Measurements: 2cm  length x 1.5cm width x 0.1cm depth; 0.236cm^3 volume. Character of Wound/Ulcer Post Debridement is improved. Severity of Tissue Post Debridement is: Fat layer exposed. Post procedure Diagnosis Wound #13: Same as Pre-Procedure Wound #14 Pre-procedure diagnosis of Wound #14 is an Abrasion located on the Left Calcaneus . There was a Selective/Open Wound Non-Viable Tissue Debridement with a total area of 0.55 sq cm performed by Maxwell Caul., MD. With the following instrument(s): Curette to remove Non-Viable tissue/material. Material removed includes [facility] after achieving pain control using Lidocaine 5% topical ointment. No specimens were taken. A time out was conducted at 14:10, prior to the start of the procedure. A Minimum amount of bleeding was controlled with Pressure. The procedure was tolerated well. Post Debridement Measurements: 0.7cm length x 1cm width x 0.1cm depth; 0.055cm^3 volume. Character of Wound/Ulcer Post Debridement is improved. Post procedure Diagnosis Wound #14: Same as Pre-Procedure Wound #15 Pre-procedure diagnosis of Wound #15 is an Abrasion located on the Right,Anterior Lower Leg .Severity of Tissue Pre Debridement is: Fat layer exposed. There was a Selective/Open Wound Non-Viable Tissue Debridement with a total area of 1.18 sq cm performed by Maxwell Caul., MD. With the following instrument(s): Curette to remove Non-Viable tissue/material. Material removed includes [facility] after achieving pain control using Lidocaine 5% topical ointment. No specimens were taken. A time out was conducted at 14:10, prior to the start of the procedure. A Minimum amount of bleeding was controlled with Pressure. The procedure was tolerated well. Post Debridement Measurements: 1.5cm length x 1cm width x 0.1cm depth; 0.118cm^3 volume. Character of Wound/Ulcer Post Debridement is improved. Severity of Tissue Post Debridement is: Fat layer exposed. Post procedure Diagnosis Wound #15: Same as  Pre-Procedure Plan KASHTYN, ENSOR ([id number]) [phone].pdf Page 12 of 13 Follow-up Appointments: Return Appointment in 1 week. - Tuesday 05/05/23 @ 1:15 w/ Dr. Leanord Hawking Anesthetic: (In clinic) Topical Lidocaine 4% applied to wound bed Bathing/ Shower/ Hygiene: May shower with protection but do not get wound dressing(s) wet. Protect dressing(s) with water repellant cover (for example, large plastic bag) or a cast cover and may then take shower. The following medication(s) was prescribed: lidocaine topical 5 % ointment ointment topical once daily was prescribed at facility WOUND #13: - Lower Leg Wound Laterality: Left, Posterior Cleanser: Soap and Water 1 x Per Week/15 Days Discharge Instructions:  May shower and wash wound with dial antibacterial soap and water prior to dressing change. Cleanser: Vashe 5.8 (oz) 1 x Per Week/15 Days Discharge Instructions: Cleanse the wound with Vashe prior to applying a clean dressing using gauze sponges, not tissue or cotton balls. Peri-Wound Care: Sween Lotion (Moisturizing lotion) 1 x Per Week/15 Days Discharge Instructions: Apply moisturizing lotion as directed Prim Dressing: Hydrofera Blue Ready Transfer Foam, 4x5 (in/in) 1 x Per Week/15 Days ary Discharge Instructions: Apply to wound bed as instructed Secondary Dressing: ABD Pad, 8x10 1 x Per Week/15 Days Discharge Instructions: Apply over primary dressing as directed. Secondary Dressing: Optifoam Non-Adhesive Dressing, 4x4 in 1 x Per Week/15 Days Discharge Instructions: Apply to ankle, achilles, lateral part of foot for protection Com pression Wrap: Urgo K2, (equivalent to a 4 layer) two layer compression system, regular 1 x Per Week/15 Days Discharge Instructions: Apply Urgo K2 as directed (alternative to 4 layer compression). WOUND #14: - Calcaneus Wound Laterality: Left Cleanser: Soap and Water 1 x Per Week/15 Days Discharge Instructions: May shower and wash wound with  dial antibacterial soap and water prior to dressing change. Cleanser: Vashe 5.8 (oz) 1 x Per Week/15 Days Discharge Instructions: Cleanse the wound with Vashe prior to applying a clean dressing using gauze sponges, not tissue or cotton balls. Peri-Wound Care: Sween Lotion (Moisturizing lotion) 1 x Per Week/15 Days Discharge Instructions: Apply moisturizing lotion as directed Prim Dressing: Hydrofera Blue Ready Transfer Foam, 4x5 (in/in) 1 x Per Week/15 Days ary Discharge Instructions: Apply to wound bed as instructed Secondary Dressing: ABD Pad, 8x10 1 x Per Week/15 Days Discharge Instructions: Apply over primary dressing as directed. Secondary Dressing: Optifoam Non-Adhesive Dressing, 4x4 in 1 x Per Week/15 Days Discharge Instructions: Apply to ankle, achilles, lateral part of foot for protection Com pression Wrap: Urgo K2, (equivalent to a 4 layer) two layer compression system, regular 1 x Per Week/15 Days Discharge Instructions: Apply Urgo K2 as directed (alternative to 4 layer compression). WOUND #15: - Lower Leg Wound Laterality: Right, Anterior Cleanser: Soap and Water 1 x Per Week/15 Days Discharge Instructions: May shower and wash wound with dial antibacterial soap and water prior to dressing change. Cleanser: Vashe 5.8 (oz) 1 x Per Week/15 Days Discharge Instructions: Cleanse the wound with Vashe prior to applying a clean dressing using gauze sponges, not tissue or cotton balls. Peri-Wound Care: Sween Lotion (Moisturizing lotion) 1 x Per Week/15 Days Discharge Instructions: Apply moisturizing lotion as directed Prim Dressing: Hydrofera Blue Ready Transfer Foam, 4x5 (in/in) 1 x Per Week/15 Days ary Discharge Instructions: Apply to wound bed as instructed Secondary Dressing: ABD Pad, 8x10 1 x Per Week/15 Days Discharge Instructions: Apply over primary dressing as directed. Secondary Dressing: Optifoam Non-Adhesive Dressing, 4x4 in 1 x Per Week/15 Days Discharge Instructions: Apply  to ankle, achilles, lateral part of foot for protection Com pression Wrap: Urgo K2, (equivalent to a 4 layer) two layer compression system, regular 1 x Per Week/15 Days Discharge Instructions: Apply Urgo K2 as directed (alternative to 4 layer compression). WOUND #16: - Lower Leg Wound Laterality: Left, Lateral Cleanser: Soap and Water 1 x Per Week/15 Days Discharge Instructions: May shower and wash wound with dial antibacterial soap and water prior to dressing change. Cleanser: Vashe 5.8 (oz) 1 x Per Week/15 Days Discharge Instructions: Cleanse the wound with Vashe prior to applying a clean dressing using gauze sponges, not tissue or cotton balls. Peri-Wound Care: Sween Lotion (Moisturizing lotion) 1 x Per Week/15 Days Discharge Instructions: Apply moisturizing  lotion as directed Prim Dressing: Hydrofera Blue Ready Transfer Foam, 4x5 (in/in) 1 x Per Week/15 Days ary Discharge Instructions: Apply to wound bed as instructed Secondary Dressing: ABD Pad, 8x10 1 x Per Week/15 Days Discharge Instructions: Apply over primary dressing as directed. Secondary Dressing: Optifoam Non-Adhesive Dressing, 4x4 in 1 x Per Week/15 Days Discharge Instructions: Apply to ankle, achilles, lateral part of foot for protection Com pression Wrap: Urgo K2, (equivalent to a 4 layer) two layer compression system, regular 1 x Per Week/15 Days Discharge Instructions: Apply Urgo K2 as directed (alternative to 4 layer compression). 1. Hydrofera Blue to all wound areas 2. New wound on the right anterior mid tibia from pulling off his stocking. Hydrofera Blue here as well. #3 I wondered about his INR. This is going to be checked in 2 days time. Last check was 3.1 according to the patient 4. Severe venous hypertension. I will need to check what we know about this Electronic Signature(s) Signed: 04/27/2023 4:37:17 PM By: Baltazar Najjar MD Entered By: Baltazar Najjar on 04/27/2023 14:22:51 [name] ([id number])  [id number] Page 13 of 13 -------------------------------------------------------------------------------- SuperBill Details Patient Name: Date of Service: JAZARION, MCMANNIS 04/27/2023 Medical Record Number: [id number] Patient Account Number: [id number] Date of Birth/Sex: Treating RN: 11/30/1955 (67 y.o. M) Primary Care Provider: Hillard Danker Other Clinician: Referring Provider: Treating Provider/Extender: Shara Blazing in Treatment: 7 Diagnosis Coding ICD-10 Codes Code Description [phone] Non-pressure chronic ulcer of other part of left lower leg with other specified severity I87.332 Chronic venous hypertension (idiopathic) with ulcer and inflammation of left lower extremity L97.328 Non-pressure chronic ulcer of left ankle with other specified severity E11.622 Type 2 diabetes mellitus with other skin ulcer L97.818 Non-pressure chronic ulcer of other part of right lower leg with other specified severity Facility Procedures : CPT4 Code: [id number] Description: 97597 - DEBRIDE WOUND 1ST 20 SQ CM OR < ICD-10 Diagnosis Description L97.828 Non-pressure chronic ulcer of other part of left lower leg with other specified se L97.328 Non-pressure chronic ulcer of left ankle with other specified severity  L97.818 Non-pressure chronic ulcer of other part of right lower leg with other specified s Modifier: verity everity Quantity: 1 Physician Procedures : CPT4 Code Description Modifier [id number] - WC PHYS DEBR WO ANESTH 20 SQ CM ICD-10 Diagnosis Description L97.828 Non-pressure chronic ulcer of other part of left lower leg with other specified severity L97.328 Non-pressure chronic ulcer of left  ankle with other specified severity L97.818 Non-pressure chronic ulcer of other part of right lower leg with other specified severity Quantity: 1 Electronic Signature(s) Signed: 04/27/2023 4:37:17 PM By: Baltazar Najjar MD Entered By:  Baltazar Najjar on 04/27/2023 14:23:09

## 2023-04-29 ENCOUNTER — Ambulatory Visit: Payer: Medicare HMO | Attending: Cardiology | Admitting: *Deleted

## 2023-04-29 DIAGNOSIS — Z5181 Encounter for therapeutic drug level monitoring: Secondary | ICD-10-CM | POA: Diagnosis not present

## 2023-04-29 DIAGNOSIS — I4891 Unspecified atrial fibrillation: Secondary | ICD-10-CM

## 2023-04-29 DIAGNOSIS — I513 Intracardiac thrombosis, not elsewhere classified: Secondary | ICD-10-CM

## 2023-04-29 LAB — POCT INR: INR: 3.6 — AB (ref 2.0–3.0)

## 2023-04-29 NOTE — Progress Notes (Signed)
JEROL, ELZA ([id number]) [phone].pdf Page 1 of 14 Visit Report for 04/27/2023 Arrival Information Details Patient Name: Date of Service: [name], [name] 04/27/2023 1:45 PM Medical Record Number: [id number] Patient Account Number: [id number] Date of Birth/Sex: Treating RN: [birth year] (67 y.o. M) Primary Care Moustafa Mossa: Hillard Danker Other Clinician: Referring Madia Carvell: Treating Shaunee Mulkern/Extender: Shara Blazing in Treatment: 7 Visit Information History Since Last Visit Added or deleted any medications: No Patient Arrived: Ambulatory Any new allergies or adverse reactions: No Arrival Time: 13:43 Had a fall or experienced change in No Accompanied By: self activities of daily living that may affect Transfer Assistance: None risk of falls: Patient Identification Verified: Yes Signs or symptoms of abuse/neglect since last visito No Secondary Verification Process Completed: Yes Hospitalized since last visit: No Patient Requires Transmission-Based Precautions: No Implantable device outside of the clinic excluding No Patient Has Alerts: Yes cellular tissue based products placed in the center Patient Alerts: Patient on Blood Thinner since last visit: Has Dressing in Place as Prescribed: Yes Has Compression in Place as Prescribed: Yes Pain Present Now: No Electronic Signature(s) Signed: 04/29/2023 4:47:30 PM By: Thayer Dallas Entered By: Thayer Dallas on 04/27/2023 13:47:31 -------------------------------------------------------------------------------- Compression Therapy Details Patient Name: Date of Service: [name] Coco. 04/27/2023 1:45 PM Medical Record Number: [id number] Patient Account Number: [id number] Date of Birth/Sex: Treating RN: Jan 21, 1956 (67 y.o. [name] Primary Care Hobson Lax: Hillard Danker Other Clinician: Referring Mar Zettler: Treating Alegria Dominique/Extender: Shara Blazing in Treatment: 7 Compression Therapy Performed for Wound Assessment: Wound #13 Left,Posterior Lower Leg Performed By: Clinician Redmond Pulling, RN Compression Type: Four Layer Post Procedure Diagnosis Same as Pre-procedure Electronic Signature(s) Signed: 04/27/2023 5:15:15 PM By: Redmond Pulling RN, BSN Entered By: Redmond Pulling on 04/27/2023 14:35:09 [name] ([id number]) [id number].pdf Page 2 of 14 -------------------------------------------------------------------------------- Compression Therapy Details Patient Name: Date of Service: [name], [name] 04/27/2023 1:45 PM Medical Record Number: [id number] Patient Account Number: [id number] Date of Birth/Sex: Treating RN: 07-08-1955 (67 y.o. [name] Primary Care Chaz Mcglasson: Hillard Danker Other Clinician: Referring Macedonio Scallon: Treating Felcia Huebert/Extender: Shara Blazing in Treatment: 7 Compression Therapy Performed for Wound Assessment: Wound #14 Left Calcaneus Performed By: Clinician Redmond Pulling, RN Compression Type: Four Layer Post Procedure Diagnosis Same as Pre-procedure Electronic Signature(s) Signed: 04/27/2023 5:15:15 PM By: Redmond Pulling RN, BSN Entered By: Redmond Pulling on 04/27/2023 14:35:10 -------------------------------------------------------------------------------- Compression Therapy Details Patient Name: Date of Service: [name] Coco. 04/27/2023 1:45 PM Medical Record Number: [id number] Patient Account Number: [id number] Date of Birth/Sex: Treating RN: 02-25-1956 (67 y.o. [name] Primary Care Tarell Schollmeyer: Hillard Danker Other Clinician: Referring Milisa Kimbell: Treating Jlynn Langille/Extender: Shara Blazing in Treatment: 7 Compression Therapy Performed for Wound Assessment: Wound #15 Right,Anterior Lower Leg Performed By: Clinician Redmond Pulling, RN Compression Type: Four Layer Post  Procedure Diagnosis Same as Pre-procedure Electronic Signature(s) Signed: 04/27/2023 5:15:15 PM By: Redmond Pulling RN, BSN Entered By: Redmond Pulling on 04/27/2023 14:35:10 -------------------------------------------------------------------------------- Compression Therapy Details Patient Name: Date of Service: [name] Coco. 04/27/2023 1:45 PM Medical Record Number: [id number] Patient Account Number: [id number] Date of Birth/Sex: Treating RN: [birth year] (67 y.o. [name] Primary Care Benna Arno: Hillard Danker Other Clinician: Referring Choua Chalker: Treating Talyn Dessert/Extender: Shara Blazing in Treatment: 7 Compression Therapy Performed for Wound Assessment: Wound #16 Left,Lateral Lower Leg Performed By: Clinician Redmond Pulling, RN Compression Type: Four Layer Post Procedure Diagnosis Same as Pre-procedure Electronic Signature(s) Signed: 04/27/2023 5:15:15 PM By: Rubye Oaks,  Lyla Son RN, BSN [place], [place] [name] ([id number]) PM By: Redmond Pulling RN, BSN [phone].pdf Page 3 of 14 Signed: 04/27/2023 5:15:15 Entered By: Redmond Pulling on 04/27/2023 14:35:10 -------------------------------------------------------------------------------- Encounter Discharge Information Details Patient Name: Date of Service: [name], [name] 04/27/2023 1:45 PM Medical Record Number: [id number] Patient Account Number: [id number] Date of Birth/Sex: Treating RN: August 26, 1955 (67 y.o. [name] Primary Care Alaney Witter: Hillard Danker Other Clinician: Referring Dannelle Rhymes: Treating Cavan Bearden/Extender: Shara Blazing in Treatment: 7 Encounter Discharge Information Items Post Procedure Vitals Discharge Condition: Stable Temperature (F): 98.2 Ambulatory Status: Ambulatory Pulse (bpm): 77 Discharge Destination: Home Respiratory Rate (breaths/min): 18 Transportation: Private Auto Blood Pressure (mmHg):  104/70 Accompanied By: self Schedule Follow-up Appointment: Yes Clinical Summary of Care: Patient Declined Electronic Signature(s) Signed: 04/27/2023 5:15:15 PM By: Redmond Pulling RN, BSN Entered By: Redmond Pulling on 04/27/2023 14:39:43 -------------------------------------------------------------------------------- Lower Extremity Assessment Details Patient Name: Date of Service: [name] Coco. 04/27/2023 1:45 PM Medical Record Number: [id number] Patient Account Number: [id number] Date of Birth/Sex: Treating RN: 09/12/1955 (67 y.o. [name] Primary Care Hendrixx Severin: Hillard Danker Other Clinician: Referring Jadie Comas: Treating Vonya Ohalloran/Extender: Shara Blazing in Treatment: 7 Edema Assessment Assessed: [Left: No] [Right: No] Edema: [Left: N] [Right: o] Calf Left: Right: Point of Measurement: 33 cm From Medial Instep 37 cm 39 cm Ankle Left: Right: Point of Measurement: 12 cm From Medial Instep 22 cm 23 cm Vascular Assessment Pulses: Dorsalis Pedis Palpable: [Left:Yes] [Right:Yes] Extremity colors, hair growth, and conditions: Extremity Color: [Left:Hyperpigmented] Hair Growth on Extremity: [Left:No] Temperature of Extremity: [Left:Warm] Capillary Refill: [Left:< 3 seconds] Dependent Rubor: [Left:No] Schnapp, Demetrion [name] ([id number]) [Left:Yes] [Right:[id number] Page 4 of 14] Electronic Signature(s) Signed: 04/27/2023 5:15:15 PM By: Redmond Pulling RN, BSN Entered By: Redmond Pulling on 04/27/2023 14:02:39 -------------------------------------------------------------------------------- Multi Wound Chart Details Patient Name: Date of Service: [name] Coco. 04/27/2023 1:45 PM Medical Record Number: [id number] Patient Account Number: [id number] Date of Birth/Sex: Treating RN: 11/20/1955 (67 y.o. M) Primary Care Ceaser Ebeling: Hillard Danker Other Clinician: Referring Yovana Scogin: Treating Anielle Headrick/Extender:  Shara Blazing in Treatment: 7 Vital Signs Height(in): 73 Capillary Blood Glucose(mg/dl): 951 Weight(lbs): 884 Pulse(bpm): 77 Body Mass Index(BMI): 33.5 Blood Pressure(mmHg): 104/70 Temperature(F): 98.2 Respiratory Rate(breaths/min): 18 [13:Photos:] Left, Posterior Lower Leg Left Calcaneus Right, Anterior Lower Leg Wound Location: Blister Blister Trauma Wounding Event: Venous Leg Ulcer Abrasion Abrasion Primary Etiology: Diabetic Wound/Ulcer of the Lower N/A Diabetic Wound/Ulcer of the Lower Secondary Etiology: Extremity Extremity Asthma, Sleep Apnea, Arrhythmia, Asthma, Sleep Apnea, Arrhythmia, Asthma, Sleep Apnea, Arrhythmia, Comorbid History: Congestive Heart Failure, Congestive Heart Failure, Congestive Heart Failure, Hypertension, Type II Diabetes Hypertension, Type II Diabetes Hypertension, Type II Diabetes 01/20/2023 04/07/2023 02/25/2023 Date Acquired: 7 1 0 Weeks of Treatment: Open Open Open Wound Status: No No No Wound Recurrence: 2x1.5x0.1 0.7x1x0.1 1.5x1x0.2 Measurements L x W x D (cm) 2.356 0.55 1.178 A (cm) : rea 0.236 0.055 0.236 Volume (cm) : 87.90% -180.60% N/A % Reduction in A rea: 93.90% -175.00% N/A % Reduction in Volume: Full Thickness Without Exposed Full Thickness Without Exposed Full Thickness Without Exposed Classification: Support Structures Support Structures Support Structures Medium Medium Medium Exudate A mount: Serosanguineous Serosanguineous Serosanguineous Exudate Type: red, brown red, brown red, brown Exudate Color: Distinct, outline attached N/A N/A Wound Margin: Large (67-100%) None Present (0%) None Present (0%) Granulation A mount: Red, Friable N/A N/A Granulation Quality: None Present (0%) Large (67-100%) Large (67-100%) Necrotic A mount: N/A Eschar Eschar, Adherent Slough Necrotic Tissue: Fat Layer (  Subcutaneous Tissue): Yes Fat Layer (Subcutaneous Tissue): Yes N/A Exposed  Structures: Fascia: No Fascia: No Tendon: No Tendon: No Muscle: No Muscle: No Joint: No Joint: No Bone: No Bone: No Small (1-33%) None None Epithelialization: Debridement - Selective/Open Wound Debridement - Selective/Open Wound Debridement - Selective/Open Wound Debridement: Pre-procedure Verification/Time Out 14:10 14:10 14:10 Taken: [name], [name] ([id number]) [id number].pdf Page 5 of 14 Lidocaine 5% topical ointment Lidocaine 5% topical ointment Lidocaine 5% topical ointment Pain Control: [facility] Tissue Debrided: Non-Viable Tissue Non-Viable Tissue Non-Viable Tissue Level: 2.36 0.55 1.18 Debridement A (sq cm): rea Curette Curette Curette Instrument: Minimum Minimum Minimum Bleeding: Pressure Pressure Pressure Hemostasis A chieved: Procedure was tolerated well Procedure was tolerated well Procedure was tolerated well Debridement Treatment Response: 2x1.5x0.1 0.7x1x0.1 1.5x1x0.1 Post Debridement Measurements L x W x D (cm) 0.236 0.055 0.118 Post Debridement Volume: (cm) Excoriation: No Excoriation: No Periwound Skin Texture: Induration: No Induration: No Callus: No Callus: No Crepitus: No Crepitus: No Rash: No Rash: No Scarring: No Scarring: No Maceration: No Maceration: No Periwound Skin Moisture: Dry/Scaly: No Dry/Scaly: No Hemosiderin Staining: Yes Atrophie Blanche: No Periwound Skin Color: Atrophie Blanche: No Cyanosis: No Cyanosis: No Ecchymosis: No Ecchymosis: No Erythema: No Erythema: No Hemosiderin Staining: No Mottled: No Mottled: No Pallor: No Pallor: No Rubor: No Rubor: No Debridement Debridement Debridement Procedures Performed: Wound Number: 16 N/A N/A Photos: No Photos N/A N/A Left, Lateral Lower Leg N/A N/A Wound Location: Gradually Appeared N/A N/A Wounding Event: Venous Leg Ulcer N/A N/A Primary Etiology: N/A N/A N/A Secondary Etiology: Asthma, Sleep Apnea, Arrhythmia, N/A  N/A Comorbid History: Congestive Heart Failure, Hypertension, Type II Diabetes 04/27/2023 N/A N/A Date A cquired: 0 N/A N/A Weeks of Treatment: Open N/A N/A Wound Status: No N/A N/A Wound Recurrence: 0.8x0.8x0.1 N/A N/A Measurements L x W x D (cm) 0.503 N/A N/A A (cm) : rea 0.05 N/A N/A Volume (cm) : N/A N/A N/A % Reduction in A rea: N/A N/A N/A % Reduction in Volume: Full Thickness Without Exposed N/A N/A Classification: Support Structures Medium N/A N/A Exudate A mount: Serosanguineous N/A N/A Exudate Type: red, brown N/A N/A Exudate Color: N/A N/A N/A Wound Margin: Large (67-100%) N/A N/A Granulation A mount: Red N/A N/A Granulation Quality: Small (1-33%) N/A N/A Necrotic A mount: Adherent Slough N/A N/A Necrotic Tissue: N/A N/A N/A Exposed Structures: None N/A N/A Epithelialization: N/A N/A N/A Debridement: N/A N/A N/A Pain Control: N/A N/A N/A Tissue Debrided: N/A N/A N/A Level: N/A N/A N/A Debridement A (sq cm): rea N/A N/A N/A Instrument: N/A N/A N/A Bleeding: N/A N/A N/A Hemostasis A chieved: Debridement Treatment Response: N/A N/A N/A Post Debridement Measurements L x N/A N/A N/A W x D (cm) N/A N/A N/A Post Debridement Volume: (cm) N/A N/A N/A Procedures Performed: Treatment Notes Electronic Signature(s) Signed: 04/27/2023 4:37:17 PM By: Baltazar Najjar MD Entered By: Baltazar Najjar on 04/27/2023 14:17:48 [name] ([id number]) [id number] Page 6 of 14 -------------------------------------------------------------------------------- Multi-Disciplinary Care Plan Details Patient Name: Date of Service: ZAKORY, ZUCCONI 04/27/2023 1:45 PM Medical Record Number: [id number] Patient Account Number: [id number] Date of Birth/Sex: Treating RN: Dec 14, 1955 (67 y.o. [name] Primary Care Kyrese Gartman: Hillard Danker Other Clinician: Referring Sherree Shankman: Treating Holle Sprick/Extender: Shara Blazing in Treatment: 7 Active Inactive Wound/Skin Impairment Nursing Diagnoses: Impaired tissue integrity Knowledge deficit related to ulceration/compromised skin integrity Goals: Patient/caregiver will verbalize understanding of skin care regimen Date Initiated: 03/03/2023 Target Resolution Date: 05/15/2023 Goal Status: Active Ulcer/skin breakdown will have  a volume reduction of 30% by week 4 Date Initiated: 03/03/2023 Target Resolution Date: 04/18/2023 Goal Status: Active Interventions: Assess patient/caregiver ability to obtain necessary supplies Assess patient/caregiver ability to perform ulcer/skin care regimen upon admission and as needed Assess ulceration(s) every visit Provide education on ulcer and skin care Notes: Electronic Signature(s) Signed: 04/27/2023 5:15:15 PM By: Redmond Pulling RN, BSN Entered By: Redmond Pulling on 04/27/2023 14:03:04 -------------------------------------------------------------------------------- Pain Assessment Details Patient Name: Date of Service: Cathey Endow [name]. 04/27/2023 1:45 PM Medical Record Number: [id number] Patient Account Number: [id number] Date of Birth/Sex: Treating RN: 1956/04/18 (67 y.o. M) Primary Care Ignazio Kincaid: Hillard Danker Other Clinician: Referring Vincenza Dail: Treating Dameka Younker/Extender: Shara Blazing in Treatment: 7 Active Problems Location of Pain Severity and Description of Pain Patient Has Paino No Site Locations [place], [place] [name] ([id number]) [id number] Page 7 of 14 Pain Management and Medication Current Pain Management: Electronic Signature(s) Signed: 04/29/2023 4:47:30 PM By: Thayer Dallas Entered By: Thayer Dallas on 04/27/2023 14:00:34 -------------------------------------------------------------------------------- Patient/Caregiver Education Details Patient Name: Date of Service: [name] Coco  11/11/2024andnbsp1:45 PM Medical Record Number: [id number] Patient Account Number: [id number] Date of Birth/Gender: Treating RN: [birth year] (67 y.o. [name] Primary Care Physician: Hillard Danker Other Clinician: Referring Physician: Treating Physician/Extender: Shara Blazing in Treatment: 7 Education Assessment Education Provided To: Patient Education Topics Provided Wound/Skin Impairment: Methods: Explain/Verbal Responses: State content correctly Electronic Signature(s) Signed: 04/27/2023 5:15:15 PM By: Redmond Pulling RN, BSN Entered By: Redmond Pulling on 04/27/2023 14:03:25 -------------------------------------------------------------------------------- Wound Assessment Details Patient Name: Date of Service: [name] Coco. 04/27/2023 1:45 PM Medical Record Number: [id number] Patient Account Number: [id number] Date of Birth/Sex: Treating RN: 29-Oct-1955 (67 y.o. M) Primary Care Diavian Furgason: Hillard Danker Other Clinician: Referring Traci Plemons: Treating Rosalina Dingwall/Extender: Iker, Hyland, Dalene Seltzer ([id number]) [id number].pdf Page 8 of 14 Weeks in Treatment: 7 Wound Status Wound Number: 13 Primary Venous Leg Ulcer Etiology: Wound Location: Left, Posterior Lower Leg Secondary Diabetic Wound/Ulcer of the Lower Extremity Wounding Event: Blister Etiology: Date Acquired: 01/20/2023 Wound Open Weeks Of Treatment: 7 Status: Clustered Wound: No Comorbid Asthma, Sleep Apnea, Arrhythmia, Congestive Heart Failure, History: Hypertension, Type II Diabetes Photos Wound Measurements Length: (cm) 2 Width: (cm) 1.5 Depth: (cm) 0.1 Area: (cm) 2.356 Volume: (cm) 0.236 % Reduction in Area: 87.9% % Reduction in Volume: 93.9% Epithelialization: Small (1-33%) Tunneling: No Undermining: No Wound Description Classification: Full Thickness Without Exposed Support Structures Wound Margin:  Distinct, outline attached Exudate Amount: Medium Exudate Type: Serosanguineous Exudate Color: red, brown Foul Odor After Cleansing: No Slough/Fibrino No Wound Bed Granulation Amount: Large (67-100%) Exposed Structure Granulation Quality: Red, Friable Fascia Exposed: No Necrotic Amount: None Present (0%) Fat Layer (Subcutaneous Tissue) Exposed: Yes Tendon Exposed: No Muscle Exposed: No Joint Exposed: No Bone Exposed: No Periwound Skin Texture Texture Color No Abnormalities Noted: No No Abnormalities Noted: No Callus: No Atrophie Blanche: No Crepitus: No Cyanosis: No Excoriation: No Ecchymosis: No Induration: No Erythema: No Rash: No Hemosiderin Staining: Yes Scarring: No Mottled: No Pallor: No Moisture Rubor: No No Abnormalities Noted: No Dry / Scaly: No Maceration: No Treatment Notes Wound #13 (Lower Leg) Wound Laterality: Left, Posterior Cleanser Soap and Water Discharge Instruction: May shower and wash wound with dial antibacterial soap and water prior to dressing change. Vashe 5.8 (oz) Discharge Instruction: Cleanse the wound with Vashe prior to applying a clean dressing using gauze sponges, not tissue or cotton balls. [name], [name] ([id number]) [id number].pdf Page 9 of 14 Peri-Wound Care Sween Lotion (Moisturizing  lotion) Discharge Instruction: Apply moisturizing lotion as directed Topical Primary Dressing Hydrofera Blue Ready Transfer Foam, 4x5 (in/in) Discharge Instruction: Apply to wound bed as instructed Secondary Dressing ABD Pad, 8x10 Discharge Instruction: Apply over primary dressing as directed. Optifoam Non-Adhesive Dressing, 4x4 in Discharge Instruction: Apply to ankle, achilles, lateral part of foot for protection Secured With Compression Wrap Urgo K2, (equivalent to a 4 layer) two layer compression system, regular Discharge Instruction: Apply Urgo K2 as directed (alternative to 4 layer compression). Compression  Stockings Add-Ons Electronic Signature(s) Signed: 04/29/2023 4:47:30 PM By: Thayer Dallas Entered By: Thayer Dallas on 04/27/2023 14:02:11 -------------------------------------------------------------------------------- Wound Assessment Details Patient Name: Date of Service: [name], [name] 04/27/2023 1:45 PM Medical Record Number: [id number] Patient Account Number: [id number] Date of Birth/Sex: Treating RN: 12-Jul-1955 (67 y.o. M) Primary Care Greysin [name]: Hillard Danker Other Clinician: Referring Hurley Blevins: Treating Aron Inge/Extender: Shara Blazing in Treatment: 7 Wound Status Wound Number: 14 Primary Abrasion Etiology: Wound Location: Left Calcaneus Wound Open Wounding Event: Blister Status: Date Acquired: 04/07/2023 Comorbid Asthma, Sleep Apnea, Arrhythmia, Congestive Heart Failure, Weeks Of Treatment: 1 History: Hypertension, Type II Diabetes Clustered Wound: No Photos Wound Measurements Length: (cm) 0.7 Width: (cm) 1 Depth: (cm) 0.1 Area: (cm) 0.55 Volume: (cm) 0.055 Clemon, Ostin [name] ([id number]) % Reduction in Area: -180.6% % Reduction in Volume: -175% Epithelialization: None Tunneling: No Undermining: No [id number] Page 10 of 14 Wound Description Classification: Full Thickness Without Exposed Support Structures Exudate Amount: Medium Exudate Type: Serosanguineous Exudate Color: red, brown Foul Odor After Cleansing: No Slough/Fibrino No Wound Bed Granulation Amount: None Present (0%) Exposed Structure Necrotic Amount: Large (67-100%) Fascia Exposed: No Necrotic Quality: Eschar Fat Layer (Subcutaneous Tissue) Exposed: Yes Tendon Exposed: No Muscle Exposed: No Joint Exposed: No Bone Exposed: No Periwound Skin Texture Texture Color No Abnormalities Noted: No No Abnormalities Noted: No Callus: No Atrophie Blanche: No Crepitus: No Cyanosis: No Excoriation: No Ecchymosis: No Induration:  No Erythema: No Rash: No Hemosiderin Staining: No Scarring: No Mottled: No Pallor: No Moisture Rubor: No No Abnormalities Noted: No Dry / Scaly: No Maceration: No Treatment Notes Wound #14 (Calcaneus) Wound Laterality: Left Cleanser Soap and Water Discharge Instruction: May shower and wash wound with dial antibacterial soap and water prior to dressing change. Vashe 5.8 (oz) Discharge Instruction: Cleanse the wound with Vashe prior to applying a clean dressing using gauze sponges, not tissue or cotton balls. Peri-Wound Care Sween Lotion (Moisturizing lotion) Discharge Instruction: Apply moisturizing lotion as directed Topical Primary Dressing Hydrofera Blue Ready Transfer Foam, 4x5 (in/in) Discharge Instruction: Apply to wound bed as instructed Secondary Dressing ABD Pad, 8x10 Discharge Instruction: Apply over primary dressing as directed. Optifoam Non-Adhesive Dressing, 4x4 in Discharge Instruction: Apply to ankle, achilles, lateral part of foot for protection Secured With Compression Wrap Urgo K2, (equivalent to a 4 layer) two layer compression system, regular Discharge Instruction: Apply Urgo K2 as directed (alternative to 4 layer compression). Compression Stockings Add-Ons Electronic Signature(s) Signed: 04/29/2023 4:47:30 PM By: Thayer Dallas Entered By: Thayer Dallas on 04/27/2023 14:03:34 [name] ([id number]) [id number] Page 11 of 14 -------------------------------------------------------------------------------- Wound Assessment Details Patient Name: Date of Service: [name], [name] 04/27/2023 1:45 PM Medical Record Number: [id number] Patient Account Number: [id number] Date of Birth/Sex: Treating RN: [birth year] (67 y.o. M) Primary Care Mallorie [name]: Hillard Danker Other Clinician: Referring Dyamond Tolosa: Treating Kristy Schomburg/Extender: Shara Blazing in Treatment: 7 Wound Status Wound Number: 15  Primary Abrasion Etiology: Wound Location: Right, Anterior Lower Leg Secondary Diabetic Wound/Ulcer of the  Lower Extremity Wounding Event: Trauma Etiology: Date Acquired: 02/25/2023 Wound Open Weeks Of Treatment: 0 Status: Clustered Wound: No Comorbid Asthma, Sleep Apnea, Arrhythmia, Congestive Heart Failure, History: Hypertension, Type II Diabetes Photos Wound Measurements Length: (cm) 1.5 Width: (cm) 1 Depth: (cm) 0.2 Area: (cm) 1.178 Volume: (cm) 0.236 % Reduction in Area: % Reduction in Volume: Epithelialization: None Tunneling: No Undermining: No Wound Description Classification: Full Thickness Without Exposed Support Structures Exudate Amount: Medium Exudate Type: Serosanguineous Exudate Color: red, brown Foul Odor After Cleansing: No Slough/Fibrino Yes Wound Bed Granulation Amount: None Present (0%) Necrotic Amount: Large (67-100%) Necrotic Quality: Eschar, Adherent Slough Periwound Skin Texture Texture Color No Abnormalities Noted: No No Abnormalities Noted: No Moisture No Abnormalities Noted: No Treatment Notes Wound #15 (Lower Leg) Wound Laterality: Right, Anterior Cleanser Soap and Water Discharge Instruction: May shower and wash wound with dial antibacterial soap and water prior to dressing change. Vashe 5.8 (oz) Discharge Instruction: Cleanse the wound with Vashe prior to applying a clean dressing using gauze sponges, not tissue or cotton balls. [name], [name] ([id number]) [phone].pdf Page 12 of 14 Peri-Wound Care Sween Lotion (Moisturizing lotion) Discharge Instruction: Apply moisturizing lotion as directed Topical Primary Dressing Hydrofera Blue Ready Transfer Foam, 4x5 (in/in) Discharge Instruction: Apply to wound bed as instructed Secondary Dressing ABD Pad, 8x10 Discharge Instruction: Apply over primary dressing as directed. Optifoam Non-Adhesive Dressing, 4x4 in Discharge Instruction: Apply to ankle, achilles,  lateral part of foot for protection Secured With Compression Wrap Urgo K2, (equivalent to a 4 layer) two layer compression system, regular Discharge Instruction: Apply Urgo K2 as directed (alternative to 4 layer compression). Compression Stockings Add-Ons Electronic Signature(s) Signed: 04/29/2023 4:47:30 PM By: Thayer Dallas Entered By: Thayer Dallas on 04/27/2023 14:06:26 -------------------------------------------------------------------------------- Wound Assessment Details Patient Name: Date of Service: [name], [name] 04/27/2023 1:45 PM Medical Record Number: [id number] Patient Account Number: [id number] Date of Birth/Sex: Treating RN: 16-Oct-1955 (67 y.o. [name] Primary Care Sahiba Granholm: Hillard Danker Other Clinician: Referring Frederik Standley: Treating Jhalil Silvera/Extender: Shara Blazing in Treatment: 7 Wound Status Wound Number: 16 Primary Venous Leg Ulcer Etiology: Wound Location: Left, Lateral Lower Leg Wound Open Wounding Event: Gradually Appeared Status: Date Acquired: 04/27/2023 Comorbid Asthma, Sleep Apnea, Arrhythmia, Congestive Heart Failure, Weeks Of Treatment: 0 History: Hypertension, Type II Diabetes Clustered Wound: No Wound Measurements Length: (cm) 0.8 Width: (cm) 0.8 Depth: (cm) 0.1 Area: (cm) 0.503 Volume: (cm) 0.05 % Reduction in Area: % Reduction in Volume: Epithelialization: None Tunneling: No Undermining: No Wound Description Classification: Full Thickness Without Exposed Support Structures Exudate Amount: Medium Exudate Type: Serosanguineous Exudate Color: red, brown Foul Odor After Cleansing: No Wound Bed Granulation Amount: Large (67-100%) Granulation Quality: Red Necrotic Amount: Small (1-33%) Necrotic Quality: [name] [address] [name] ([id number]) [id number].pdf Page 13 of 14 Periwound Skin Texture Texture Color No Abnormalities Noted: No No Abnormalities  Noted: No Moisture No Abnormalities Noted: No Treatment Notes Wound #16 (Lower Leg) Wound Laterality: Left, Lateral Cleanser Soap and Water Discharge Instruction: May shower and wash wound with dial antibacterial soap and water prior to dressing change. Vashe 5.8 (oz) Discharge Instruction: Cleanse the wound with Vashe prior to applying a clean dressing using gauze sponges, not tissue or cotton balls. Peri-Wound Care Sween Lotion (Moisturizing lotion) Discharge Instruction: Apply moisturizing lotion as directed Topical Primary Dressing Hydrofera Blue Ready Transfer Foam, 4x5 (in/in) Discharge Instruction: Apply to wound bed as instructed Secondary Dressing ABD Pad, 8x10 Discharge Instruction: Apply over primary dressing as directed. Optifoam Non-Adhesive Dressing, 4x4  in Discharge Instruction: Apply to ankle, achilles, lateral part of foot for protection Secured With Compression Wrap Urgo K2, (equivalent to a 4 layer) two layer compression system, regular Discharge Instruction: Apply Urgo K2 as directed (alternative to 4 layer compression). Compression Stockings Add-Ons Electronic Signature(s) Signed: 04/27/2023 5:15:15 PM By: Redmond Pulling RN, BSN Entered By: Redmond Pulling on 04/27/2023 14:16:33 -------------------------------------------------------------------------------- Vitals Details Patient Name: Date of Service: [name] Asper NDY [name]. 04/27/2023 1:45 PM Medical Record Number: [id number] Patient Account Number: [id number] Date of Birth/Sex: Treating RN: [birth year] (67 y.o. M) Primary Care Roxana Lai: Hillard Danker Other Clinician: Referring Chenita Ruda: Treating Kalene Cutler/Extender: Shara Blazing in Treatment: 7 Vital Signs Time Taken: 13:47 Temperature (F): 98.2 Height (in): 73 Pulse (bpm): 77 Weight (lbs): 254 Respiratory Rate (breaths/min): 18 Body Mass Index (BMI): 33.5 Blood Pressure (mmHg): 104/70 Capillary Blood Glucose  (mg/dl): 409 Reference Range: 80 - 120 mg / dl Electronic Signature(s) [name], SOU[place] ([id number]) [phone].pdf Page 14 of 14 Signed: 04/29/2023 4:47:30 PM By: Thayer Dallas Entered By: Thayer Dallas on 04/27/2023 13:49:36

## 2023-04-29 NOTE — Patient Instructions (Signed)
Description   Do not take any warfarin today then START taking Warfarin 1 tablet daily except for 1.5 tablets on Sunday, Tuesday, and Thursday. Recheck INR in 3 weeks. [facility] [phone].

## 2023-05-03 DIAGNOSIS — E1169 Type 2 diabetes mellitus with other specified complication: Secondary | ICD-10-CM | POA: Diagnosis not present

## 2023-05-05 ENCOUNTER — Encounter (HOSPITAL_BASED_OUTPATIENT_CLINIC_OR_DEPARTMENT_OTHER): Payer: Medicare HMO | Admitting: Internal Medicine

## 2023-05-05 DIAGNOSIS — I5040 Unspecified combined systolic (congestive) and diastolic (congestive) heart failure: Secondary | ICD-10-CM | POA: Diagnosis not present

## 2023-05-05 DIAGNOSIS — E11622 Type 2 diabetes mellitus with other skin ulcer: Secondary | ICD-10-CM | POA: Diagnosis not present

## 2023-05-05 DIAGNOSIS — I872 Venous insufficiency (chronic) (peripheral): Secondary | ICD-10-CM | POA: Diagnosis not present

## 2023-05-05 DIAGNOSIS — Z7901 Long term (current) use of anticoagulants: Secondary | ICD-10-CM | POA: Diagnosis not present

## 2023-05-05 DIAGNOSIS — L97822 Non-pressure chronic ulcer of other part of left lower leg with fat layer exposed: Secondary | ICD-10-CM | POA: Diagnosis not present

## 2023-05-05 DIAGNOSIS — I428 Other cardiomyopathies: Secondary | ICD-10-CM | POA: Diagnosis not present

## 2023-05-05 DIAGNOSIS — L97818 Non-pressure chronic ulcer of other part of right lower leg with other specified severity: Secondary | ICD-10-CM | POA: Diagnosis not present

## 2023-05-05 DIAGNOSIS — L97222 Non-pressure chronic ulcer of left calf with fat layer exposed: Secondary | ICD-10-CM | POA: Diagnosis not present

## 2023-05-05 DIAGNOSIS — I480 Paroxysmal atrial fibrillation: Secondary | ICD-10-CM | POA: Diagnosis not present

## 2023-05-05 DIAGNOSIS — I87332 Chronic venous hypertension (idiopathic) with ulcer and inflammation of left lower extremity: Secondary | ICD-10-CM | POA: Diagnosis not present

## 2023-05-05 DIAGNOSIS — S80812A Abrasion, left lower leg, initial encounter: Secondary | ICD-10-CM | POA: Diagnosis not present

## 2023-05-05 DIAGNOSIS — L97328 Non-pressure chronic ulcer of left ankle with other specified severity: Secondary | ICD-10-CM | POA: Diagnosis not present

## 2023-05-05 DIAGNOSIS — L97828 Non-pressure chronic ulcer of other part of left lower leg with other specified severity: Secondary | ICD-10-CM | POA: Diagnosis not present

## 2023-05-05 NOTE — Progress Notes (Signed)
[name] ([id number]) [phone].pdf Page 1 of 16 Visit Report for 05/05/2023 Arrival Information Details Patient Name: Date of Service: [name], [name] 05/05/2023 1:15 PM Medical Record Number: [id number] Patient Account Number: [id number] Date of Birth/Sex: Treating RN: 1955-10-08 (67 y.o. M) Primary Care Jazsmine Macari: Hillard Danker Other Clinician: Referring Quincie Haroon: Treating Onedia Vargus/Extender: Shara Blazing in Treatment: 9 Visit Information History Since Last Visit Added or deleted any medications: No Patient Arrived: Ambulatory Any new allergies or adverse reactions: No Arrival Time: 13:09 Had a fall or experienced change in No Accompanied By: self activities of daily living that may affect Transfer Assistance: None risk of falls: Patient Identification Verified: Yes Signs or symptoms of abuse/neglect since last visito No Secondary Verification Process Completed: Yes Hospitalized since last visit: No Patient Requires Transmission-Based Precautions: No Implantable device outside of the clinic excluding No Patient Has Alerts: Yes cellular tissue based products placed in the center Patient Alerts: Patient on Blood Thinner since last visit: Has Dressing in Place as Prescribed: Yes Has Compression in Place as Prescribed: Yes Pain Present Now: No Electronic Signature(s) Signed: 05/05/2023 4:41:35 PM By: Thayer Dallas Entered By: Thayer Dallas on 05/05/2023 13:12:18 -------------------------------------------------------------------------------- Compression Therapy Details Patient Name: Date of Service: [name] 05/05/2023 1:15 PM Medical Record Number: [id number] Patient Account Number: [id number] Date of Birth/Sex: Treating RN: [birth year] (67 y.o. [name] Primary Care Raianna Slight: Hillard Danker Other Clinician: Referring Ranferi Clingan: Treating Adina Puzzo/Extender: Shara Blazing in Treatment: 9 Compression Therapy Performed for Wound Assessment: Wound #13 Left,Posterior Lower Leg Performed By: Clinician Fonnie Mu, RN Compression Type: Double Layer Post Procedure Diagnosis Same as Pre-procedure Electronic Signature(s) Signed: 05/05/2023 4:47:01 PM By: Shawn Stall RN, BSN Entered By: Shawn Stall on 05/05/2023 13:46:43 [name] ([id number]) [id number] Page 2 of 16 -------------------------------------------------------------------------------- Compression Therapy Details Patient Name: Date of Service: [name] 05/05/2023 1:15 PM Medical Record Number: [id number] Patient Account Number: [id number] Date of Birth/Sex: Treating RN: 14-Jun-1956 (67 y.o. [name] Primary Care Jakai Onofre: Hillard Danker Other Clinician: Referring Monigue Spraggins: Treating Minetta Krisher/Extender: Shara Blazing in Treatment: 9 Compression Therapy Performed for Wound Assessment: Wound #14 Left Calcaneus Performed By: Clinician Fonnie Mu, RN Compression Type: Double Layer Post Procedure Diagnosis Same as Pre-procedure Electronic Signature(s) Signed: 05/05/2023 4:47:01 PM By: Shawn Stall RN, BSN Entered By: Shawn Stall on 05/05/2023 13:46:43 -------------------------------------------------------------------------------- Compression Therapy Details Patient Name: Date of Service: [name]. 05/05/2023 1:15 PM Medical Record Number: [id number] Patient Account Number: [id number] Date of Birth/Sex: Treating RN: [birth year] (67 y.o. [name] Primary Care Jaysa Kise: Hillard Danker Other Clinician: Referring Shauntelle Jamerson: Treating Judee Hennick/Extender: Shara Blazing in Treatment: 9 Compression Therapy Performed for Wound Assessment: Wound #15 Right,Anterior Lower Leg Performed By: Clinician Fonnie Mu, RN Compression Type: Double  Layer Post Procedure Diagnosis Same as Pre-procedure Electronic Signature(s) Signed: 05/05/2023 4:47:01 PM By: Shawn Stall RN, BSN Entered By: Shawn Stall on 05/05/2023 13:46:43 -------------------------------------------------------------------------------- Compression Therapy Details Patient Name: Date of Service: [name]. 05/05/2023 1:15 PM Medical Record Number: [id number] Patient Account Number: [id number] Date of Birth/Sex: Treating RN: Sep 29, 1955 (67 y.o. [name] Primary Care Eura Mccauslin: Hillard Danker Other Clinician: Referring Javon Hupfer: Treating Milana Salay/Extender: Shara Blazing in Treatment: 9 Compression Therapy Performed for Wound Assessment: Wound #16 Left,Lateral Lower Leg Performed By: Clinician Fonnie Mu, RN Compression Type: Double Layer Post Procedure Diagnosis Same as Pre-procedure Electronic Signature(s) Signed: 05/05/2023 4:47:01 PM By: Elesa Hacker  Yvonne Kendall RN, BSN [place] [place] [name] ([id number]) PM By: Shawn Stall RN, BSN [phone].pdf Page 3 of 16 Signed: 05/05/2023 4:47:01 Entered By: Shawn Stall on 05/05/2023 13:49:29 -------------------------------------------------------------------------------- Encounter Discharge Information Details Patient Name: Date of Service: [name] 05/05/2023 1:15 PM Medical Record Number: [id number] Patient Account Number: [id number] Date of Birth/Sex: Treating RN: December 02, 1955 (67 y.o. [name] Primary Care Thor Nannini: Hillard Danker Other Clinician: Referring Tej Murdaugh: Treating Twan Harkin/Extender: Shara Blazing in Treatment: 9 Encounter Discharge Information Items Post Procedure Vitals Discharge Condition: Stable Temperature (F): 97.8 Ambulatory Status: Ambulatory Pulse (bpm): 74 Discharge Destination: Home Respiratory Rate (breaths/min): 18 Transportation: Private Auto Blood Pressure (mmHg):  105/70 Accompanied By: self Schedule Follow-up Appointment: Yes Clinical Summary of Care: Electronic Signature(s) Signed: 05/05/2023 4:47:01 PM By: Shawn Stall RN, BSN Entered By: Shawn Stall on 05/05/2023 13:53:36 -------------------------------------------------------------------------------- Lower Extremity Assessment Details Patient Name: Date of Service: [name], [name] 05/05/2023 1:15 PM Medical Record Number: [id number] Patient Account Number: [id number] Date of Birth/Sex: Treating RN: 1956-03-29 (67 y.o. M) Primary Care Valery Amedee: Hillard Danker Other Clinician: Referring Alicya Bena: Treating Abraham Margulies/Extender: Shara Blazing in Treatment: 9 Edema Assessment Assessed: [name]: No] [Right: No] Edema: [Left: N] [Right: o] Calf Left: Right: Point of Measurement: 33 cm From Medial Instep 37 cm 38 cm Ankle Left: Right: Point of Measurement: 12 cm From Medial Instep 23 cm 21.5 cm Vascular Assessment Extremity colors, hair growth, and conditions: Extremity Color: [Left:Hyperpigmented] Hair Growth on Extremity: [Left:No] Temperature of Extremity: [Left:Warm] Capillary Refill: [Left:< 3 seconds] Dependent Rubor: [Left:No] Lipodermatosclerosis: [Left:Yes] Soeder, Lamonte [name] ([id number]) [Right:[id number] Page 4 of 16] Electronic Signature(s) Signed: 05/05/2023 4:41:35 PM By: Thayer Dallas Entered By: Thayer Dallas on 05/05/2023 13:33:39 -------------------------------------------------------------------------------- Multi Wound Chart Details Patient Name: Date of Service: [name] Asper NDY [name]. 05/05/2023 1:15 PM Medical Record Number: [id number] Patient Account Number: [id number] Date of Birth/Sex: Treating RN: [birth year] (67 y.o. M) Primary Care Rosaland Shiffman: Hillard Danker Other Clinician: Referring Loann Chahal: Treating Dona Walby/Extender: Shara Blazing in Treatment: 9 Vital  Signs Height(in): 73 Capillary Blood Glucose(mg/dl): 542 Weight(lbs): 706 Pulse(bpm): 74 Body Mass Index(BMI): 33.5 Blood Pressure(mmHg): 105/70 Temperature(F): 97.8 Respiratory Rate(breaths/min): 18 [13:Photos:] Left, Posterior Lower Leg Left Calcaneus Right, Anterior Lower Leg Wound Location: Blister Blister Trauma Wounding Event: Venous Leg Ulcer Abrasion Abrasion Primary Etiology: Diabetic Wound/Ulcer of the Lower N/A Diabetic Wound/Ulcer of the Lower Secondary Etiology: Extremity Extremity Asthma, Sleep Apnea, Arrhythmia, Asthma, Sleep Apnea, Arrhythmia, Asthma, Sleep Apnea, Arrhythmia, Comorbid History: Congestive Heart Failure, Congestive Heart Failure, Congestive Heart Failure, Hypertension, Type II Diabetes Hypertension, Type II Diabetes Hypertension, Type II Diabetes 01/20/2023 04/07/2023 02/25/2023 Date Acquired: 9 3 1  Weeks of Treatment: Open Open Open Wound Status: No No No Wound Recurrence: 1x1x0.1 1.2x1x0.1 2x1.8x0.1 Measurements L x W x D (cm) 0.785 0.942 2.827 A (cm) : rea 0.079 0.094 0.283 Volume (cm) : 96.00% -380.60% -140.00% % Reduction in A rea: 98.00% -370.00% -19.90% % Reduction in Volume: Full Thickness Without Exposed Full Thickness Without Exposed Full Thickness Without Exposed Classification: Support Structures Support Structures Support Structures Medium Medium Medium Exudate A mount: Serosanguineous Serosanguineous Serosanguineous Exudate Type: red, brown red, brown red, brown Exudate Color: Distinct, outline attached N/A N/A Wound Margin: Large (67-100%) Large (67-100%) Large (67-100%) Granulation A mount: Red, Friable N/A Red, Pink Granulation Quality: None Present (0%) None Present (0%) Small (1-33%) Necrotic A mount: N/A N/A Adherent Slough Necrotic Tissue: Fat Layer (Subcutaneous Tissue): Yes Fat Layer (Subcutaneous Tissue): Yes Fat Layer (Subcutaneous Tissue):  Yes Exposed Structures: Fascia: No Fascia: No Tendon:  No Tendon: No Muscle: No Muscle: No Joint: No Joint: No Bone: No Bone: No Small (1-33%) None None Epithelialization: Debridement - Selective/Open Wound Debridement - Selective/Open Wound N/A Debridement: Pre-procedure Verification/Time Out 13:40 13:40 N/A Taken: Lidocaine 4% Topical Solution Lidocaine 4% Topical Solution N/A Pain Control: Necrotic/Eschar Necrotic/Eschar N/A Tissue Debrided: Non-Viable Tissue Skin/Epidermis N/A LevelFIONA, [name] ([id number]) [id number] Page 5 of 16 0.78 0.94 N/A Debridement A (sq cm): rea Curette Curette N/A Instrument: Minimum Minimum N/A Bleeding: Pressure Pressure N/A Hemostasis Achieved: 0 0 N/A Procedural Pain: 0 0 N/A Post Procedural Pain: Procedure was tolerated well Procedure was tolerated well N/A Debridement Treatment Response: 1x1x0.1 1.2x1x0.1 N/A Post Debridement Measurements L x W x D (cm) 0.079 0.094 N/A Post Debridement Volume: (cm) Excoriation: No Excoriation: No Excoriation: No Periwound Skin Texture: Induration: No Induration: No Induration: No Callus: No Callus: No Callus: No Crepitus: No Crepitus: No Crepitus: No Rash: No Rash: No Rash: No Scarring: No Scarring: No Scarring: No Maceration: No Maceration: No Maceration: No Periwound Skin Moisture: Dry/Scaly: No Dry/Scaly: No Dry/Scaly: No Hemosiderin Staining: Yes Atrophie Blanche: No Atrophie Blanche: No Periwound Skin Color: Atrophie Blanche: No Cyanosis: No Cyanosis: No Cyanosis: No Ecchymosis: No Ecchymosis: No Ecchymosis: No Erythema: No Erythema: No Erythema: No Hemosiderin Staining: No Hemosiderin Staining: No Mottled: No Mottled: No Mottled: No Pallor: No Pallor: No Pallor: No Rubor: No Rubor: No Rubor: No No Abnormality N/A N/A Temperature: Compression Therapy Compression Therapy Compression Therapy Procedures Performed: Debridement Debridement Wound Number: 16 N/A N/A Photos:  N/A N/A Left, Lateral Lower Leg N/A N/A Wound Location: Gradually Appeared N/A N/A Wounding Event: Venous Leg Ulcer N/A N/A Primary Etiology: N/A N/A N/A Secondary Etiology: Asthma, Sleep Apnea, Arrhythmia, N/A N/A Comorbid History: Congestive Heart Failure, Hypertension, Type II Diabetes 04/27/2023 N/A N/A Date Acquired: 1 N/A N/A Weeks of Treatment: Open N/A N/A Wound Status: No N/A N/A Wound Recurrence: 0.5x0.5x0.1 N/A N/A Measurements L x W x D (cm) 0.196 N/A N/A A (cm) : rea 0.02 N/A N/A Volume (cm) : 61.00% N/A N/A % Reduction in A rea: 60.00% N/A N/A % Reduction in Volume: Full Thickness Without Exposed N/A N/A Classification: Support Structures None Present N/A N/A Exudate A mount: N/A N/A N/A Exudate Type: N/A N/A N/A Exudate Color: Distinct, outline attached N/A N/A Wound Margin: None Present (0%) N/A N/A Granulation A mount: N/A N/A N/A Granulation Quality: Large (67-100%) N/A N/A Necrotic A mount: Eschar N/A N/A Necrotic Tissue: Fascia: No N/A N/A Exposed Structures: Fat Layer (Subcutaneous Tissue): No Tendon: No Muscle: No Joint: No Bone: No None N/A N/A Epithelialization: Debridement - Selective/Open Wound N/A N/A Debridement: Pre-procedure Verification/Time Out 13:40 N/A N/A Taken: Lidocaine 4% Topical Solution N/A N/A Pain Control: Necrotic/Eschar N/A N/A Tissue Debrided: Non-Viable Tissue N/A N/A Level: 0.2 N/A N/A Debridement A (sq cm): rea Curette N/A N/A Instrument: Minimum N/A N/A Bleeding: Pressure N/A N/A Hemostasis A chieved: 0 N/A N/A Procedural Pain: 0 N/A N/A Post Procedural PainMACLAIN, DUECKER ([id number]) [id number] Page 6 of 16 Procedure was tolerated well N/A N/A Debridement Treatment Response: 0.5x0.5x0.1 N/A N/A Post Debridement Measurements L x W x D (cm) 0.02 N/A N/A Post Debridement Volume: (cm) Excoriation: No N/A N/A Periwound Skin Texture: Induration:  No Callus: No Crepitus: No Rash: No Scarring: No Maceration: No N/A N/A Periwound Skin Moisture: Dry/Scaly: No Atrophie Blanche: No N/A N/A Periwound Skin Color: Cyanosis: No Ecchymosis: No Erythema: No Hemosiderin Staining:  No Mottled: No Pallor: No Rubor: No No Abnormality N/A N/A Temperature: Compression Therapy N/A N/A Procedures Performed: Debridement Treatment Notes Wound #13 (Lower Leg) Wound Laterality: Left, Posterior Cleanser Soap and Water Discharge Instruction: May shower and wash wound with dial antibacterial soap and water prior to dressing change. Vashe 5.8 (oz) Discharge Instruction: Cleanse the wound with Vashe prior to applying a clean dressing using gauze sponges, not tissue or cotton balls. Peri-Wound Care Sween Lotion (Moisturizing lotion) Discharge Instruction: Apply moisturizing lotion as directed Topical Primary Dressing Hydrofera Blue Ready Transfer Foam, 4x5 (in/in) Discharge Instruction: Apply to wound bed as instructed Secondary Dressing ABD Pad, 8x10 Discharge Instruction: Apply over primary dressing as directed. Optifoam Non-Adhesive Dressing, 4x4 in Discharge Instruction: Apply to ankle, achilles, lateral part of foot for protection Secured With Compression Wrap Urgo K2, (equivalent to a 4 layer) two layer compression system, regular Discharge Instruction: Apply Urgo K2 as directed (alternative to 4 layer compression). Compression Stockings Add-Ons Wound #14 (Calcaneus) Wound Laterality: Left Cleanser Soap and Water Discharge Instruction: May shower and wash wound with dial antibacterial soap and water prior to dressing change. Vashe 5.8 (oz) Discharge Instruction: Cleanse the wound with Vashe prior to applying a clean dressing using gauze sponges, not tissue or cotton balls. Peri-Wound Care Sween Lotion (Moisturizing lotion) Discharge Instruction: Apply moisturizing lotion as directed Topical Primary Dressing Hydrofera Blue  Ready Transfer Foam, 4x5 (in/in) Discharge Instruction: Apply to wound bed as instructed KASEM, HAFELE ([id number]) [phone].pdf Page 7 of 16 Secondary Dressing ABD Pad, 8x10 Discharge Instruction: Apply over primary dressing as directed. Optifoam Non-Adhesive Dressing, 4x4 in Discharge Instruction: Apply to ankle, achilles, lateral part of foot for protection Secured With Compression Wrap Urgo K2, (equivalent to a 4 layer) two layer compression system, regular Discharge Instruction: Apply Urgo K2 as directed (alternative to 4 layer compression). Compression Stockings Add-Ons Wound #15 (Lower Leg) Wound Laterality: Right, Anterior Cleanser Soap and Water Discharge Instruction: May shower and wash wound with dial antibacterial soap and water prior to dressing change. Vashe 5.8 (oz) Discharge Instruction: Cleanse the wound with Vashe prior to applying a clean dressing using gauze sponges, not tissue or cotton balls. Peri-Wound Care Sween Lotion (Moisturizing lotion) Discharge Instruction: Apply moisturizing lotion as directed Topical Primary Dressing Hydrofera Blue Ready Transfer Foam, 4x5 (in/in) Discharge Instruction: Apply to wound bed as instructed Secondary Dressing ABD Pad, 8x10 Discharge Instruction: Apply over primary dressing as directed. Optifoam Non-Adhesive Dressing, 4x4 in Discharge Instruction: Apply to ankle, achilles, lateral part of foot for protection Secured With Compression Wrap Urgo K2, (equivalent to a 4 layer) two layer compression system, regular Discharge Instruction: Apply Urgo K2 as directed (alternative to 4 layer compression). Compression Stockings Add-Ons Wound #16 (Lower Leg) Wound Laterality: Left, Lateral Cleanser Soap and Water Discharge Instruction: May shower and wash wound with dial antibacterial soap and water prior to dressing change. Vashe 5.8 (oz) Discharge Instruction: Cleanse the wound with Vashe prior to  applying a clean dressing using gauze sponges, not tissue or cotton balls. Peri-Wound Care Sween Lotion (Moisturizing lotion) Discharge Instruction: Apply moisturizing lotion as directed Topical Primary Dressing Hydrofera Blue Ready Transfer Foam, 4x5 (in/in) Discharge Instruction: Apply to wound bed as instructed Secondary Dressing ABD Pad, 8x10 Discharge Instruction: Apply over primary dressing as directed. Optifoam Non-Adhesive Dressing, 4x4 in Discharge Instruction: Apply to ankle, achilles, lateral part of foot for protection ILO, TARTAGLIONE ([id number]) [phone].pdf Page 8 of 16 Secured With Compression Wrap Urgo K2, (equivalent to a 4 layer) two  layer compression system, regular Discharge Instruction: Apply Urgo K2 as directed (alternative to 4 layer compression). Compression Stockings Add-Ons Electronic Signature(s) Signed: 05/05/2023 4:19:19 PM By: Baltazar Najjar MD Entered By: Baltazar Najjar on 05/05/2023 13:55:00 -------------------------------------------------------------------------------- Multi-Disciplinary Care Plan Details Patient Name: Date of Service: [place] 05/05/2023 1:15 PM Medical Record Number: [id number] Patient Account Number: [id number] Date of Birth/Sex: Treating RN: [birth year] (67 y.o. [name] Primary Care Vishwa Dais: Hillard Danker Other Clinician: Referring Lakeria Starkman: Treating Lakedra Washington/Extender: Shara Blazing in Treatment: 9 Active Inactive Wound/Skin Impairment Nursing Diagnoses: Impaired tissue integrity Knowledge deficit related to ulceration/compromised skin integrity Goals: Patient/caregiver will verbalize understanding of skin care regimen Date Initiated: 03/03/2023 Target Resolution Date: 05/15/2023 Goal Status: Active Ulcer/skin breakdown will have a volume reduction of 30% by week 4 Date Initiated: 03/03/2023 Date Inactivated: 05/05/2023 Target Resolution  Date: 04/18/2023 Unmet Reason: see wound Goal Status: Unmet measurements. Interventions: Assess patient/caregiver ability to obtain necessary supplies Assess patient/caregiver ability to perform ulcer/skin care regimen upon admission and as needed Assess ulceration(s) every visit Provide education on ulcer and skin care Notes: Electronic Signature(s) Signed: 05/05/2023 4:47:01 PM By: Shawn Stall RN, BSN Entered By: Shawn Stall on 05/05/2023 13:42:05 -------------------------------------------------------------------------------- Pain Assessment Details Patient Name: Date of Service: [name], [name] NDY [name]. 05/05/2023 1:15 PM Medical Record Number: [id number] Patient Account Number: [id number] TRAMON, PICKING ([id number]) [phone].pdf Page 9 of 16 Date of Birth/Sex: Treating RN: 07/26/1955 (67 y.o. M) Primary Care Park Beck: Other Clinician: Hillard Danker Referring Nzinga Ferran: Treating Arin Peral/Extender: Shara Blazing in Treatment: 9 Active Problems Location of Pain Severity and Description of Pain Patient Has Paino No Site Locations Pain Management and Medication Current Pain Management: Electronic Signature(s) Signed: 05/05/2023 4:41:35 PM By: Thayer Dallas Entered By: Thayer Dallas on 05/05/2023 13:12:36 -------------------------------------------------------------------------------- Patient/Caregiver Education Details Patient Name: Date of Service: [name] 11/19/2024andnbsp1:15 PM Medical Record Number: [id number] Patient Account Number: [id number] Date of Birth/Gender: Treating RN: 1956/04/24 (67 y.o. [name] Primary Care Physician: Hillard Danker Other Clinician: Referring Physician: Treating Physician/Extender: Shara Blazing in Treatment: 9 Education Assessment Education Provided To: Patient Education Topics Provided Wound/Skin Impairment: Handouts:  Caring for Your Ulcer Methods: Explain/Verbal Responses: Reinforcements needed Electronic Signature(s) Signed: 05/05/2023 4:47:01 PM By: Shawn Stall RN, BSN Entered By: Shawn Stall on 05/05/2023 13:42:19 [name] ([id number]) [id number] Page 10 of 16 -------------------------------------------------------------------------------- Wound Assessment Details Patient Name: Date of Service: [name], [name] 05/05/2023 1:15 PM Medical Record Number: [id number] Patient Account Number: [id number] Date of Birth/Sex: Treating RN: 1956/01/31 (67 y.o. M) Primary Care Carollee Nussbaumer: Hillard Danker Other Clinician: Referring Deltha Bernales: Treating Melven Stockard/Extender: Shara Blazing in Treatment: 9 Wound Status Wound Number: 13 Primary Venous Leg Ulcer Etiology: Wound Location: Left, Posterior Lower Leg Secondary Diabetic Wound/Ulcer of the Lower Extremity Wounding Event: Blister Etiology: Date Acquired: 01/20/2023 Wound Open Weeks Of Treatment: 9 Status: Clustered Wound: No Comorbid Asthma, Sleep Apnea, Arrhythmia, Congestive Heart Failure, History: Hypertension, Type II Diabetes Photos Wound Measurements Length: (cm) 1 Width: (cm) 1 Depth: (cm) 0.1 Area: (cm) 0.785 Volume: (cm) 0.079 % Reduction in Area: 96% % Reduction in Volume: 98% Epithelialization: Small (1-33%) Tunneling: No Undermining: No Wound Description Classification: Full Thickness Without Exposed Suppor Wound Margin: Distinct, outline attached Exudate Amount: Medium Exudate Type: Serosanguineous Exudate Color: red, brown t Structures Foul Odor After Cleansing: No Slough/Fibrino No Wound Bed Granulation Amount: Large (67-100%) Exposed Structure Granulation Quality: Red, Friable Fascia Exposed: No Necrotic Amount: None  Present (0%) Fat Layer (Subcutaneous Tissue) Exposed: Yes Tendon Exposed: No Muscle Exposed: No Joint Exposed: No Bone Exposed:  No Periwound Skin Texture Texture Color No Abnormalities Noted: No No Abnormalities Noted: No Callus: No Atrophie Blanche: No Crepitus: No Cyanosis: No Excoriation: No Ecchymosis: No Induration: No Erythema: No Rash: No Hemosiderin Staining: Yes Scarring: No Mottled: No Pallor: No Moisture Rubor: No No Abnormalities Noted: No Dry / Scaly: No Temperature / Pain [name], [name] ([id number]) [id number] Page 11 of 16 Maceration: No Temperature: No Abnormality Treatment Notes Wound #13 (Lower Leg) Wound Laterality: Left, Posterior Cleanser Soap and Water Discharge Instruction: May shower and wash wound with dial antibacterial soap and water prior to dressing change. Vashe 5.8 (oz) Discharge Instruction: Cleanse the wound with Vashe prior to applying a clean dressing using gauze sponges, not tissue or cotton balls. Peri-Wound Care Sween Lotion (Moisturizing lotion) Discharge Instruction: Apply moisturizing lotion as directed Topical Primary Dressing Hydrofera Blue Ready Transfer Foam, 4x5 (in/in) Discharge Instruction: Apply to wound bed as instructed Secondary Dressing ABD Pad, 8x10 Discharge Instruction: Apply over primary dressing as directed. Optifoam Non-Adhesive Dressing, 4x4 in Discharge Instruction: Apply to ankle, achilles, lateral part of foot for protection Secured With Compression Wrap Urgo K2, (equivalent to a 4 layer) two layer compression system, regular Discharge Instruction: Apply Urgo K2 as directed (alternative to 4 layer compression). Compression Stockings Add-Ons Electronic Signature(s) Signed: 05/05/2023 4:41:35 PM By: Thayer Dallas Entered By: Thayer Dallas on 05/05/2023 13:36:20 -------------------------------------------------------------------------------- Wound Assessment Details Patient Name: Date of Service: [name], [name] 05/05/2023 1:15 PM Medical Record Number: [id number] Patient Account Number:  [id number] Date of Birth/Sex: Treating RN: [birth year] (67 y.o. M) Primary Care Ipek Westra: Hillard Danker Other Clinician: Referring Caroleann Casler: Treating Aavya Shafer/Extender: Shara Blazing in Treatment: 9 Wound Status Wound Number: 14 Primary Abrasion Etiology: Wound Location: Left Calcaneus Wound Open Wounding Event: Blister Status: Date Acquired: 04/07/2023 Comorbid Asthma, Sleep Apnea, Arrhythmia, Congestive Heart Failure, Weeks Of Treatment: 3 History: Hypertension, Type II Diabetes Clustered Wound: No Photos [name], [name] ([id number]) [phone].pdf Page 12 of 16 Wound Measurements Length: (cm) 1.2 Width: (cm) 1 Depth: (cm) 0.1 Area: (cm) 0.942 Volume: (cm) 0.094 % Reduction in Area: -380.6% % Reduction in Volume: -370% Epithelialization: None Tunneling: No Undermining: No Wound Description Classification: Full Thickness Without Exposed Support Structures Exudate Amount: Medium Exudate Type: Serosanguineous Exudate Color: red, brown Foul Odor After Cleansing: No Slough/Fibrino No Wound Bed Granulation Amount: Large (67-100%) Exposed Structure Necrotic Amount: None Present (0%) Fascia Exposed: No Fat Layer (Subcutaneous Tissue) Exposed: Yes Tendon Exposed: No Muscle Exposed: No Joint Exposed: No Bone Exposed: No Periwound Skin Texture Texture Color No Abnormalities Noted: No No Abnormalities Noted: No Callus: No Atrophie Blanche: No Crepitus: No Cyanosis: No Excoriation: No Ecchymosis: No Induration: No Erythema: No Rash: No Hemosiderin Staining: No Scarring: No Mottled: No Pallor: No Moisture Rubor: No No Abnormalities Noted: No Dry / Scaly: No Maceration: No Treatment Notes Wound #14 (Calcaneus) Wound Laterality: Left Cleanser Soap and Water Discharge Instruction: May shower and wash wound with dial antibacterial soap and water prior to dressing change. Vashe 5.8 (oz) Discharge  Instruction: Cleanse the wound with Vashe prior to applying a clean dressing using gauze sponges, not tissue or cotton balls. Peri-Wound Care Sween Lotion (Moisturizing lotion) Discharge Instruction: Apply moisturizing lotion as directed Topical Primary Dressing Hydrofera Blue Ready Transfer Foam, 4x5 (in/in) Discharge Instruction: Apply to wound bed as instructed Secondary Dressing ABD Pad, 8x10 Discharge Instruction: Apply over primary dressing  as directed. Optifoam Non-Adhesive Dressing, 4x4 in [place], BRAXDYN PERA ([id number]) [phone].pdf Page 13 of 16 Discharge Instruction: Apply to ankle, achilles, lateral part of foot for protection Secured With Compression Wrap Urgo K2, (equivalent to a 4 layer) two layer compression system, regular Discharge Instruction: Apply Urgo K2 as directed (alternative to 4 layer compression). Compression Stockings Add-Ons Electronic Signature(s) Signed: 05/05/2023 4:41:35 PM By: Thayer Dallas Entered By: Thayer Dallas on 05/05/2023 13:37:10 -------------------------------------------------------------------------------- Wound Assessment Details Patient Name: Date of Service: [name], [name] 05/05/2023 1:15 PM Medical Record Number: [id number] Patient Account Number: [id number] Date of Birth/Sex: Treating RN: 13-Apr-1956 (67 y.o. M) Primary Care Maryellen Dowdle: Hillard Danker Other Clinician: Referring Kemoni Quesenberry: Treating Lynsey Ange/Extender: Shara Blazing in Treatment: 9 Wound Status Wound Number: 15 Primary Abrasion Etiology: Wound Location: Right, Anterior Lower Leg Secondary Diabetic Wound/Ulcer of the Lower Extremity Wounding Event: Trauma Etiology: Date Acquired: 02/25/2023 Wound Open Weeks Of Treatment: 1 Status: Clustered Wound: No Comorbid Asthma, Sleep Apnea, Arrhythmia, Congestive Heart Failure, History: Hypertension, Type II Diabetes Photos Wound Measurements Length: (cm)  2 Width: (cm) 1.8 Depth: (cm) 0.1 Area: (cm) 2.827 Volume: (cm) 0.283 % Reduction in Area: -140% % Reduction in Volume: -19.9% Epithelialization: None Tunneling: No Undermining: No Wound Description Classification: Full Thickness Without Exposed Support Structures Exudate Amount: Medium Exudate Type: Serosanguineous Exudate Color: red, brown Foul Odor After Cleansing: No Slough/Fibrino Yes Wound Bed Granulation Amount: Large (67-100%) Exposed Structure Granulation Quality: Red, Pink Fat Layer (Subcutaneous Tissue) Exposed: Yes Necrotic Amount: Small (1-33%) Necrotic Quality: [address], Dinuba [name] ([id number]) [id number].pdf Page 14 of 16 Periwound Skin Texture Texture Color No Abnormalities Noted: No No Abnormalities Noted: No Callus: No Atrophie Blanche: No Crepitus: No Cyanosis: No Excoriation: No Ecchymosis: No Induration: No Erythema: No Rash: No Hemosiderin Staining: No Scarring: No Mottled: No Pallor: No Moisture Rubor: No No Abnormalities Noted: No Dry / Scaly: No Maceration: No Treatment Notes Wound #15 (Lower Leg) Wound Laterality: Right, Anterior Cleanser Soap and Water Discharge Instruction: May shower and wash wound with dial antibacterial soap and water prior to dressing change. Vashe 5.8 (oz) Discharge Instruction: Cleanse the wound with Vashe prior to applying a clean dressing using gauze sponges, not tissue or cotton balls. Peri-Wound Care Sween Lotion (Moisturizing lotion) Discharge Instruction: Apply moisturizing lotion as directed Topical Primary Dressing Hydrofera Blue Ready Transfer Foam, 4x5 (in/in) Discharge Instruction: Apply to wound bed as instructed Secondary Dressing ABD Pad, 8x10 Discharge Instruction: Apply over primary dressing as directed. Optifoam Non-Adhesive Dressing, 4x4 in Discharge Instruction: Apply to ankle, achilles, lateral part of foot for protection Secured With Compression  Wrap Urgo K2, (equivalent to a 4 layer) two layer compression system, regular Discharge Instruction: Apply Urgo K2 as directed (alternative to 4 layer compression). Compression Stockings Add-Ons Electronic Signature(s) Signed: 05/05/2023 4:41:35 PM By: Thayer Dallas Entered By: Thayer Dallas on 05/05/2023 13:38:01 -------------------------------------------------------------------------------- Wound Assessment Details Patient Name: Date of Service: NAHZIR, [name] 05/05/2023 1:15 PM Medical Record Number: [id number] Patient Account Number: [id number] Date of Birth/Sex: Treating RN: 01/15/1956 (67 y.o. M) Primary Care Tia Gelb: Hillard Danker Other Clinician: Referring Adalaya Irion: Treating Delcenia Inman/Extender: Shara Blazing in Treatment: 9 Wound Status Wound Number: 16 Primary Venous Leg Ulcer Etiology: [name], [name] ([id number]) [phone].pdf Page 15 of 16 Etiology: Wound Location: Left, Lateral Lower Leg Wound Open Wounding Event: Gradually Appeared Status: Date Acquired: 04/27/2023 Comorbid Asthma, Sleep Apnea, Arrhythmia, Congestive Heart Failure, Weeks Of Treatment: 1 History: Hypertension, Type II Diabetes Clustered Wound: No  Photos Wound Measurements Length: (cm) 0.5 Width: (cm) 0.5 Depth: (cm) 0.1 Area: (cm) 0.196 Volume: (cm) 0.02 % Reduction in Area: 61% % Reduction in Volume: 60% Epithelialization: None Tunneling: No Undermining: No Wound Description Classification: Full Thickness Without Exposed Support Structures Wound Margin: Distinct, outline attached Exudate Amount: None Present Foul Odor After Cleansing: No Slough/Fibrino No Wound Bed Granulation Amount: None Present (0%) Exposed Structure Necrotic Amount: Large (67-100%) Fascia Exposed: No Necrotic Quality: Eschar Fat Layer (Subcutaneous Tissue) Exposed: No Tendon Exposed: No Muscle Exposed: No Joint Exposed: No Bone Exposed:  No Periwound Skin Texture Texture Color No Abnormalities Noted: No No Abnormalities Noted: No Callus: No Atrophie Blanche: No Crepitus: No Cyanosis: No Excoriation: No Ecchymosis: No Induration: No Erythema: No Rash: No Hemosiderin Staining: No Scarring: No Mottled: No Pallor: No Moisture Rubor: No No Abnormalities Noted: No Dry / Scaly: No Temperature / Pain Maceration: No Temperature: No Abnormality Treatment Notes Wound #16 (Lower Leg) Wound Laterality: Left, Lateral Cleanser Soap and Water Discharge Instruction: May shower and wash wound with dial antibacterial soap and water prior to dressing change. Vashe 5.8 (oz) Discharge Instruction: Cleanse the wound with Vashe prior to applying a clean dressing using gauze sponges, not tissue or cotton balls. Peri-Wound Care Sween Lotion (Moisturizing lotion) Discharge Instruction: Apply moisturizing lotion as directed Topical Primary Dressing [address] Ready Transfer Foam, 4x5 (in/in) [place], Mar[place]o [name] ([id number]) [id number].pdf Page 16 of 16 Discharge Instruction: Apply to wound bed as instructed Secondary Dressing ABD Pad, 8x10 Discharge Instruction: Apply over primary dressing as directed. Optifoam Non-Adhesive Dressing, 4x4 in Discharge Instruction: Apply to ankle, achilles, lateral part of foot for protection Secured With Compression Wrap Urgo K2, (equivalent to a 4 layer) two layer compression system, regular Discharge Instruction: Apply Urgo K2 as directed (alternative to 4 layer compression). Compression Stockings Add-Ons Electronic Signature(s) Signed: 05/05/2023 4:47:01 PM By: Shawn Stall RN, BSN Entered By: Shawn Stall on 05/05/2023 13:48:10 -------------------------------------------------------------------------------- Vitals Details Patient Name: Date of Service: [name], [name] NDY [name]. 05/05/2023 1:15 PM Medical Record Number: [id number] Patient Account Number:  [id number] Date of Birth/Sex: Treating RN: Aug 07, 1955 (67 y.o. M) Primary Care Cindia Hustead: Hillard Danker Other Clinician: Referring Dalores Weger: Treating Breydon Senters/Extender: Shara Blazing in Treatment: 9 Vital Signs Time Taken: 13:12 Temperature (F): 97.8 Height (in): 73 Pulse (bpm): 74 Weight (lbs): 254 Respiratory Rate (breaths/min): 18 Body Mass Index (BMI): 33.5 Blood Pressure (mmHg): 105/70 Capillary Blood Glucose (mg/dl): 253 Reference Range: 80 - 120 mg / dl Electronic Signature(s) Signed: 05/05/2023 4:41:35 PM By: Thayer Dallas Entered By: Thayer Dallas on 05/05/2023 13:15:18

## 2023-05-05 NOTE — Progress Notes (Signed)
[name], [name] ([id number]) [id number].pdf Page 1 of 14 Visit Report for 05/05/2023 Debridement Details Patient Name: Date of Service: [name], [name] 05/05/2023 1:15 PM Medical Record Number: [id number] Patient Account Number: [id number] Date of Birth/Sex: Treating RN: Sep 12, 1955 (67 y.o. M) Primary Care Provider: Hillard [name] Other Clinician: Referring Provider: Treating Provider/Extender: [name] in Treatment: 9 Debridement Performed for Assessment: Wound #13 Left,Posterior Lower Leg Performed By: Physician [name] Caul., MD The following information was scribed by: [name] The information was scribed for: [name] Debridement Type: Debridement Severity of Tissue Pre Debridement: Fat layer exposed Level of Consciousness (Pre-procedure): Awake and Alert Pre-procedure Verification/Time Out Yes - 13:40 Taken: Start Time: 13:41 Pain Control: Lidocaine 4% Topical Solution Percent of Wound Bed Debrided: 100% T Area Debrided (cm): otal 0.78 Tissue and other material debrided: Non-Viable, Eschar Level: Non-Viable Tissue Debridement Description: Selective/Open Wound Instrument: Curette Bleeding: Minimum Hemostasis Achieved: Pressure End Time: 13:48 Procedural Pain: 0 Post Procedural Pain: 0 Response to Treatment: Procedure was tolerated well Level of Consciousness (Post- Awake and Alert procedure): Post Debridement Measurements of Total Wound Length: (cm) 1 Width: (cm) 1 Depth: (cm) 0.1 Volume: (cm) 0.079 Character of Wound/Ulcer Post Debridement: Improved Severity of Tissue Post Debridement: Fat layer exposed Post Procedure Diagnosis Same as Pre-procedure Electronic Signature(s) Signed: 05/05/2023 4:19:19 PM By: [name] Najjar MD Entered By: [name] on 05/05/2023 10:55:24 -------------------------------------------------------------------------------- Debridement  Details Patient Name: Date of Service: [name] Asper NDY J. 05/05/2023 1:15 PM Medical Record Number: [id number] Patient Account Number: [id number] Date of Birth/Sex: Treating RN: 1955-06-25 (67 y.o. M) Primary Care Provider: Hillard [name] Other Clinician: Referring Provider: Treating Provider/Extender: [name] in Treatment: [address] [place] ([id number]) [id number] Page 2 of 14 Debridement Performed for Assessment: Wound #14 Left Calcaneus Performed By: Physician [name] Caul., MD The following information was scribed by: [name] The information was scribed for: [name] Debridement Type: Debridement Level of Consciousness (Pre-procedure): Awake and Alert Pre-procedure Verification/Time Out Yes - 13:40 Taken: Start Time: 13:41 Pain Control: Lidocaine 4% T opical Solution Percent of Wound Bed Debrided: 100% T Area Debrided (cm): otal 0.94 Tissue and other material debrided: Viable, Non-Viable, Eschar, Skin: Dermis , Skin: Epidermis Level: Skin/Epidermis Debridement Description: Selective/Open Wound Instrument: Curette Bleeding: Minimum Hemostasis Achieved: Pressure End Time: 13:48 Procedural Pain: 0 Post Procedural Pain: 0 Response to Treatment: Procedure was tolerated well Level of Consciousness (Post- Awake and Alert procedure): Post Debridement Measurements of Total Wound Length: (cm) 1.2 Width: (cm) 1 Depth: (cm) 0.1 Volume: (cm) 0.094 Character of Wound/Ulcer Post Debridement: Improved Post Procedure Diagnosis Same as Pre-procedure Electronic Signature(s) Signed: 05/05/2023 4:19:19 PM By: [name] Najjar MD Entered By: [name] on 05/05/2023 10:55:33 -------------------------------------------------------------------------------- Debridement Details Patient Name: Date of Service: [name] Asper NDY J. 05/05/2023 1:15 PM Medical Record Number: [id number] Patient Account  Number: [id number] Date of Birth/Sex: Treating RN: 1956/05/04 (67 y.o. M) Primary Care Provider: Hillard [name] Other Clinician: Referring Provider: Treating Provider/Extender: [name] in Treatment: 9 Debridement Performed for Assessment: Wound #16 Left,Lateral Lower Leg Performed By: Physician [name] Caul., MD The following information was scribed by: [name] The information was scribed for: [name] Debridement Type: Debridement Severity of Tissue Pre Debridement: Fat layer exposed Level of Consciousness (Pre-procedure): Awake and Alert Pre-procedure Verification/Time Out Yes - 13:40 Taken: Start Time: 13:41 Pain Control: Lidocaine 4% Topical Solution Percent of Wound Bed Debrided: 100% T Area Debrided (cm): otal 0.2 Tissue and  other material debrided: Non-Viable, Eschar Level: Non-Viable Tissue Debridement Description: Selective/Open Wound Instrument: Curette Bleeding: Minimum Hemostasis Achieved: Pressure End Time: 13:48 [name], [name] ([id number]) [phone].pdf Page 3 of 14 Procedural Pain: 0 Post Procedural Pain: 0 Response to Treatment: Procedure was tolerated well Level of Consciousness (Post- Awake and Alert procedure): Post Debridement Measurements of Total Wound Length: (cm) 0.5 Width: (cm) 0.5 Depth: (cm) 0.1 Volume: (cm) 0.02 Character of Wound/Ulcer Post Debridement: Improved Severity of Tissue Post Debridement: Fat layer exposed Post Procedure Diagnosis Same as Pre-procedure Electronic Signature(s) Signed: 05/05/2023 4:19:19 PM By: [name] Najjar MD Entered By: [name] on 05/05/2023 10:55:45 -------------------------------------------------------------------------------- HPI Details Patient Name: Date of Service: [name] Asper NDY J. 05/05/2023 1:15 PM Medical Record Number: [id number] Patient Account Number: [id number] Date of Birth/Sex: Treating RN: [birth year]  (67 y.o. M) Primary Care Provider: Hillard [name] Other Clinician: Referring Provider: Treating Provider/Extender: [name] in Treatment: 9 History of Present Illness HPI Description: ADMISSION 07/29/2018 [name] is a 67 year old man with either prediabetes or diabetes he is on glipizide. In late October to November 2019 he noted a scabbed area on the back of his right calf. He picked this off a few times but it would not heal. He saw his primary physician on 12/5. It was felt at that time that this may actually heal on its own with conservative management. The next visit was on 07/20/2018 noted the area was a lot worse and arranged for his treatment here. He has been topical antibiotics like Neosporin although he stopped using this when the wound looked worse. He is just been covering this with a clean Band-Aid. He is given Bactrim a week ago and he is finishing this currently. It is made some improvement in the surrounding erythema per the patient. He does not have a history of nonhealing wounds. No prior history of wounds on his legs that he had difficulty healing. No prior skin issues. 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 Past medical history; A. fib status post ablation and recent cardioversion he is on Xarelto. He has a history of systolic heart failure, cervical radiculopathy, cardiomyopathy and type 2 diabetes as discussed ABI in the right leg was noncompressible on the right 2/20; the biopsy I did on the patient last week was negative for malignancy. Culture grew Pseudomonas and methicillin sensitive staph aureus. He is on cefdinir 300 twice a day. I will have to make this a 10 day course. He put him in compression. He states that the redness pain and erythema are a lot better. I suspect the patient has chronic venous insufficiency probably with a secondary cellulitis 2/27; arrives today with copious amounts of drainage coming  out of the wound irritating the skin below the wound area. He only renewed the final 3 days of cefdinir today 3/5; came in for a nurse change 3 days ago. Again a lot of drainage noted. Zinc oxide was applied unfortunately the drainage appears to a pool then he has a string of superficial open areas extending down into the Achilles area and some just below the wound. The wound itself does not look too bad. He is completed the antibiotics although apparently there was separation from the original 7 with the last 3 days. Culture grew a few MSSA and a few Pseudomonas 3/12; not too much difference over the last week. He came in for a dressing change on Monday by our nursing staff. Necrotic debris again over the wound  surface. The entire area looks irritated but nontender and I do not think shows obvious evidence of infection. We have not heard anything about the reflux studies. Also notable than in this diabetic man we had noncompressible vessels and although I can feel his pulses easily in his feet I will order arterial studies as well. 3/19-Patient had experience more pain has been dressed with a silver alginate, the wounds all have necrotic debris, very friable with easy bleeding with any kind of surface debridement including with gauze and Anasept and with a #3 curette. His vascular appointments unfortunately were all canceled on account of the virus outbreak resulting in studies only being done for emergent cases. His pulses are easily palpable in the lower extremity. He has open areas below the primary wound where he states the drainage which included purulent material also with some blood pooled and caused breakdown. 3/26; the patient was changed to Santyl with [facility] backing last week. This was largely because the silver alginate was sticking to the wound. He is still having quite a bit of pain. He tells [place] that the reflux studies and arterial studies we had attempted to arrange through vein  and vascular are not being booked until mid May. Culture that was done last week showed both Serratia moderate and a few methicillin sensitive staph aureus I started him on Bactrim DS 1 p.o. twice daily on 3/23 for 7 days. He is here for follow-up. 4/3; patient is on Santyl with Hydrofera Blue. Patient complains of pain. Our intake nurse is noted drainage and some odor. He has completed Bactrim recently for Serratia and a few methicillin sensitive staph aureus. After our staff push hard last week we were able to get venous studies as well as arterial studies. His arterial studies showed on the right great toe pressure of LEDON, MULDREW ([id number]) [phone].pdf Page 4 of 14 0.86. On the left ABI at 1.47 TBI of 0.87. On both sides waveforms were triphasic VENOUS STUDIES showed reflux in the femoral vein, popliteal vein great saphenous vein at the saphenofemoral junction and great saphenous vein at the proximal thigh. Also noted to have age-indeterminate superficial vein thrombosis involving the small saphenous vein. Notable that the patient is already on Xarelto for atrial fibrillation. 4/9; the patient has been to see vascular surgery and reviewed by Dr. Durwin Nora. He was felt to have only minimal superficial venous reflux in the right great greater saphenous vein. For this reason he was not felt to be a candidate for laser ablation of the right greater saphenous vein. A wedge cushion was suggested to keep his leg elevated. He does have deep venous reflux on the right. Culture I did last time showed a combination of Serratia Pseudomonas and methicillin sensitive staph aureus. We have been using Hydrofera Blue to the large wound, Santyl to some of the deep punched out satellite lesions distal to it. There is some improvement 4/16; the application for Apligraf was put out for further review for material that we have resubmitted. He has the deep area on the right posterior calf  which is large and then 3 small punched out areas beneath this. In general his wounds look a lot better 4/23; the patient has his large original wound and then 2 punched out satellite lesions below it on the right posterior calf. I was usually able to use Apligraf #1 to cover the full surface area of the larger wound. We continued with Santyl and Hydrofera Blue on the 2 punched-out  areas 5/7; patient has his large original wound and 2 punched out satellite lesions below it in the right posterior calf. Apligraf #2 today. Major improvement in the big wound in terms of wound depth. Punched-out [place] wounds have a very healthy looking wound bed. 5/21-Patient comes back for his large right posterior calf wound for which she has been an Apligraf #3 today. Wound depth seems to be better, the punched-out wounds appear to have healthy bed with some bleeding 6/4; right posterior calf wound is much better. Apligraf #4 today. The major wound has no wound depth at all. Only 1 of the satellite lesions is still open. The patient has very high blood pressure coming in today with a diastolic blood pressure of 130 after lying there for a few minutes it was down to 110. He states that is running 100 210 diastolic at home. He was high last week as well I thought he was on 50 mg 1/2 tablet twice daily of losartan I told him to double up on that however it turns out he is actually on 50 twice daily. Course he is run out of his medications prematurely. He has a follow-up with his primary's office next Wednesday. 6/18; patient's wounds continue to improve. The small area laterally is just about closed and there is continued epithelialization on the area on the posterior calf. 7/2; we applied his last Apligraf last visit. Early the next week there was a lot of purulent looking drainage that I cultured this grew staph and Pseudomonas however when we had him back for the next visit to 3 days later everything looked a lot  better. He did not receive systemic antibiotics. Fortunately we did not have to remove the Apligraf. He has had heart trouble this week he was having an ablation apparently they have discovered a clot in his left atrium they have changed all his anticoagulants as well as his blood pressure medication. 7/16; using Hydrofera Blue. We are making nice progress towards closure. He has not yet ordered his compression stockings he has his measurements 7/23; dimensions are better. He has a new satellite area superiorly. Both wounds cauterized with silver nitrate. 7/30; absolutely no change in the major wound on the posterior part of the right calf. We have been using Hydrofera Blue for a prolonged period of time and really had a nice improvement but over the last few weeks this is stalled. There is no depth. He arrived in clinic today with a new area on the right anterior tibia which apparently was an "ingrown hair". It is clearly an open area. This looks like a wrap injury although he was not really aware of it. 8/31; since the patient was last in clinic he was admitted to hospital from 8/17 through 8/22. He had had multiple falls at home including one off a ladder. He was admitted to hospital with mild COVID-19 viral pneumonia. Noted to be hyperkalemic and in acute renal failure. He has chronic systolic heart failure with ejection fraction of 35%. He arrives back in clinic with the original wound on the posterior aspect of the right lower calf looking a lot better. He has eschared areas from falls and lacerations on his anterior knees bilaterally just below the patella and on the mid part of his tibia bilaterally. He seems to have a small area on the right lateral ankle as well 9/10- Patient comes to clinic with a 2 new wounds one on the left anterior shin and one on the right  posterior leg both the left knee and right knee areas are healed up. We have been using Santyl to the left anterior leg and silver  alginate to right posterior leg wounds. 9/17; the patient is original wound looks good on the posterior right calf. He has traumatic areas on the left anterior shin left anterior patellar tendon and the right mid tibia area. Except for the right posterior calf all required debridement. We have been using Santyl on these areas and silver alginate posteriorly right calf 9/24; the original wound on the posterior calf on the right looks good. This is healthy. There are traumatic wounds in the left anterior shin, left anterior patella in the right mid tibia area are about the same. He has dusky erythema on the left anterior tibia which led me to give him antibiotics last week. This does not look too much different. This is nontender and I wonder if this is all just venous inflammation. 10/2; the patient has 2 open areas on the right posterior calf both of these look good we have been using silver alginate. He has traumatic wounds on the left and right mid tibia areas. The surface of these wounds is still not ready for closure. We have been using Iodoflex. Finally has areas on the left knee. The latter wounds are all traumatic after a fall 10/9. His original wounds of the right posterior calf are superficial and progressing towards closure. His traumatic wounds on the left anterior tibia and right anterior tibia are considerably improved in terms of the wound surface. He asked me about a skin graft in these areas I do not think this is going to be necessary. Change from Iodosorb to [facility]. The area on the left knee is just about closed as well 10/16; his original wound on the right posterior calf very superficial looks healthy. He has more recent traumatic wounds on the left anterior tibia and right anterior tibia both of these have better looking surfaces and measuring smaller. Might be beneficial for an advanced treatment product. The area on his left knee has a small superficial scab and we are  going to close that when 10/23; his original wound areas on the right posterior calf continue to improve. The more recently traumatic wounds on the left anterior tibia and right anterior tibia both look better in terms of surfaces. No debridement required. We have been using Hydrofera Blue. He is approved for Apligraf 11/3; Apligraf #1 applied to the left anterior tibia right anterior tibia and to 2 small areas remaining on the right posterior calf which were part of his initial wound area. 11/19; Apligraf #2. Left anterior tibia is healed. Right anterior tibia much better. Only one small area remains on the right posterior calf which was part of his initial wound area 12/3; the left anterior tibia has opened up again. Hyper granulated nodule. Right anterior tibia is superficial and larger. There is no depth of this. I did not place Apligraf today return to [facility] under compression 12/10; the patient has 2 remaining wounds 1 on the right posterior and the other on the left anterior. Both of these look quite good. No debridement is required we have been using Hydrofera Blue under compression. 12/17; the patient has really not closed over which is disappointing. His wraps were very tight and there may be some wrap injury issues. On the left he has the original wound on the medial mid tibia he has a wrap injury on the lateral mid  tibia. On the right his posterior areas are superficial but certainly not closed 12/31; the patient has totally closed on the left. He will transition to his own 20/30 below-knee stocking on the left leg today. Interestingly on the right posterior calf the 2 wounds that he had from last time of closed however there is an additional injury in this same area. Almost looks as though there is subcutaneous fluid/blistering. There is no evidence of cellulitis this does not seem tender 06/24/2019; the patient comes in having been placed in his 20/30 stockings on the left leg  last week. He states that he noted blisters break open on Saturday and they had opened into wounds on the anterior tibial area on the left by Monday. The area on the posterior right leg looks satisfactory we are still doing that and compression. We are using calcium alginate to all wound areas 1/14; the patient had juxta light however one of them was not the right size which will need to be returned. The new area last week on the left anterior tibia looks a lot better it measures smaller I would say the area on the right is about the same, this is on the right posterior calf. We have been using silver alginate 1/21; he has his bilateral juxta lites however the left anterior wound continues to look better where is the 2 areas on the right posterior calf look about the same. We have been using Hydrofera Blue under compression with the juxta lite stockings now on standby 2/4; the areas on the left are totally healed and he will be discharged into his own stocking/juxta light. On the right posterior calf he has what looks to be a blister with a small area of subdermal hemorrhage this is very tiny but I did I am going to wrap him today and see if this will be reabsorbed by next week 2/11; he comes in this week with expanded wounds on the posterior calf as well as a new wound on the left anterior. He said the left anterior started a few days [name], [name] ([id number]) [phone].pdf Page 5 of 14 ago with a blister that is now surface. He is using his juxta lite stockings at a pressure of 30/40 2/19; we wrapped his left leg last week after a failed attempt to discharge that leg and his juxta lites 2 weeks ago. The leg is again healed he has superficial areas posteriorly on the right leg 2/26; the patient came in initially with right posterior calf wound secondary to minor trauma with secondary infection. He went on to develop a left lower leg wound. In the interim he had  congestive heart failure with acute renal failure a fall with bilateral anterior leg wounds and finally Covid requiring admission to pneumonia with pneumonia. He is finally closed. He has external compression stockings bilaterally [juxta lite stockings]. His wounds are healed READMISSION 03/03/2023 This is a patient that we had here for a year from 20 20 through 20 21 with a sizable wound on the right posterior calf which was secondary to a combination of chronic venous insufficiency and secondary infection in the wound when he came here. This was a problematic wound in itself however he developed traumatic wounds on his lower extremities after a fall from a ladder during the same admission. Eventually he was healed out and discharged and juxta lite stockings. He tells me that everything was fine up until about 2-1/2 months ago he developed a blister and an  open area on the left posterior calf this time. He saw his primary doctor and received a course of Bactrim. He has been using Silvadene with AandE ointment and covering this with gauze. He is here for review of this area. When he was here in 2021 he also saw Dr. Woodfin Ganja of vein and vascular he had venous reflux studies that showed deep venous reflux but nothing that was amenable to surgery. He also had arterial studies. He was noncompressible on the left but had normal TBIs and triphasic waveforms. In terms of the reflux studies I am not really sure whether the left leg was involved in that study or not we will need to review that. His past medical history includes type type 2 diabetes, chronic venous insufficiency with secondary lymphedema, intermittent A-fib, nonischemic cardiomyopathy, thrombus of the left atrial appendage, hereditary hemochromatosis cytosis. He is now on Coumadin presumably for the atrial fibrillation. ABIs in this clinic were not repeated 9/24; posterior left calf wound. We used Hydrofera Blue under compression. The wound  looks better this week better looking surface. 10/1. Posterior left calf wound secondary to venous insufficiency. He was new to the clinic in 2 weeks ago at which time I thought he was going to require a difficult set of mechanical debridements however the wound surface is continuing to improve using Hydrofera Blue although the dimensions not so remarkably improved today 10/8; posterior left calf wound secondary to chronic venous insufficiency. He continues to think nice progress here on this wound the wound is smaller better looking surface. He arrived in clinic today with an abrasion on his Achilles area on the left leg as well as what looked to be a potential deep tissue injury on the lateral lateral part of the left foot at the level of the base of the fifth metatarsal. 10/15 posterior left calf wound continues to improve. We have been using Prisma under ABDs under Urgo K2 lite's. The areas on the Achilles and the left lateral foot which were concerning last time of maintain skin integrity. He is not wearing stockings even on the right leg I talked to him about this. 10/22; posterior left calf wound measures slightly larger today. The abrasion injury in the Achilles send on the lateral fifth metatarsal base look improved. We have been using Prisma under compression I am going to change this to [facility] today 10/29; posterior left calf wound continues to progress nicely. Presumably an abrasion injury on the Achilles heel. I changed dressing to Hydrofera Blue. Will need to dress the area on the Achilles today. The area on the fifth metatarsal base is not open 11/5; posterior left calf wound continues to improve nicely presumably an abrasion injury on the left Achilles also looks clean. He has a what looks to be an abrasion on the base of the fifth metatarsal it is not open but it looks as though there is been some friction on this area we will pad this today under the wrap. Patient with  chronic venous insufficiency severe hemosiderin deposition and resultant wounds. He has been in the clinic before however that was for a wound on the right posterior calf. Notable he is not wearing stockings even on the uninvolved right leg today 11/11; his original wound on the posterior left calf continues to improve in dimensions. Once again he comes in today with some black eschar around the edge I wonder if this is bleeding. He has the area on the Achilles and now a new  area on the left upper lower leg which he says he scratched. And finally he has an area on the right anterior mid tibia which he said he got from taking off his stocking 11/19; he has wounds on his bilateral lower legs. We have been wrapping with Urgo K2's. He has original wound on the left posterior calf, probably a wrap injury on the left lateral calf, area on the left Achilles, and finally the right medial lower leg. We have been using Hydrofera Blue Electronic Signature(s) Signed: 05/05/2023 4:19:19 PM By: [name] Najjar MD Entered By: [name] on 05/05/2023 10:57:11 -------------------------------------------------------------------------------- Physical Exam Details Patient Name: Date of Service: [name] Asper NDY J. 05/05/2023 1:15 PM Medical Record Number: [id number] Patient Account Number: [id number] Date of Birth/Sex: Treating RN: 1956/01/25 (67 y.o. M) Primary Care Provider: Hillard [name] Other Clinician: Referring Provider: Treating Provider/Extender: [name] in Treatment: 9 Constitutional Sitting or standing Blood Pressure is within target range for patient.. Pulse regular and within target range for patient.[name] Respirations regular, non-labored and within target range.. Temperature is normal and within the target range for the patient.[name] Appears in no distress. AODHAN, MCCALLEY ([id number]) [phone].pdf Page 6 of 14 Notes Wound exam; left  posterior calf is his original wound. Completely surrounded by eschar which I removed also some eschar inferiorly using a #3 curette. I removed skin and eschar from around the Achilles area and eschar from the left lateral calf. I did not debride the right leg Electronic Signature(s) Signed: 05/05/2023 4:19:19 PM By: [name] Najjar MD Entered By: [name] on 05/05/2023 10:58:10 -------------------------------------------------------------------------------- Physician Orders Details Patient Name: Date of Service: [name] Asper NDY J. 05/05/2023 1:15 PM Medical Record Number: [id number] Patient Account Number: [id number] Date of Birth/Sex: Treating RN: 15-Jun-1956 (67 y.o. Harlon Flor, Millard.Loa Primary Care Provider: Hillard [name] Other Clinician: Referring Provider: Treating Provider/Extender: [name] in Treatment: 9 The following information was scribed by: [name] The information was scribed for: [name] Verbal / Phone Orders: No Diagnosis Coding ICD-10 Coding Code Description L97.828 Non-pressure chronic ulcer of other part of left lower leg with other specified severity I87.332 Chronic venous hypertension (idiopathic) with ulcer and inflammation of left lower extremity L97.328 Non-pressure chronic ulcer of left ankle with other specified severity E11.622 Type 2 diabetes mellitus with other skin ulcer L97.818 Non-pressure chronic ulcer of other part of right lower leg with other specified severity Follow-up Appointments ppointment in 2 weeks. - 05/19/2023 (already scheduled ) Dr. Leanord Hawking Return A Nurse Visit: - 05/12/2023 130pm (already scheduled) Anesthetic (In clinic) Topical Lidocaine 4% applied to wound bed Bathing/ Shower/ Hygiene May shower with protection but do not get wound dressing(s) wet. Protect dressing(s) with water repellant cover (for example, large plastic bag) or a cast cover and may then take shower. Edema  Control - Orders / Instructions Elevate legs to the level of the heart or above for 30 minutes daily and/or when sitting for 3-4 times a day throughout the day. Avoid standing for long periods of time. Exercise regularly Wound Treatment Wound #13 - Lower Leg Wound Laterality: Left, Posterior Cleanser: Soap and Water 1 x Per Week/15 Days Discharge Instructions: May shower and wash wound with dial antibacterial soap and water prior to dressing change. Cleanser: Vashe 5.8 (oz) 1 x Per Week/15 Days Discharge Instructions: Cleanse the wound with Vashe prior to applying a clean dressing using gauze sponges, not tissue or cotton balls. Peri-Wound Care: Sween Lotion (Moisturizing lotion) 1 x Per Week/15  Days Discharge Instructions: Apply moisturizing lotion as directed Prim Dressing: Hydrofera Blue Ready Transfer Foam, 4x5 (in/in) 1 x Per Week/15 Days ary Discharge Instructions: Apply to wound bed as instructed Secondary Dressing: ABD Pad, 8x10 1 x Per Week/15 Days Discharge Instructions: Apply over primary dressing as directed. Secondary Dressing: Optifoam Non-Adhesive Dressing, 4x4 in 1 x Per Week/15 Days Discharge Instructions: Apply to ankle, achilles, lateral part of foot for protection Compression Wrap: Urgo K2, (equivalent to a 4 layer) two layer compression system, regular 1 x Per [address] [name], [name] ([id number]) [phone].pdf Page 7 of 14 Discharge Instructions: Apply Urgo K2 as directed (alternative to 4 layer compression). Wound #14 - Calcaneus Wound Laterality: Left Cleanser: Soap and Water 1 x Per Week/15 Days Discharge Instructions: May shower and wash wound with dial antibacterial soap and water prior to dressing change. Cleanser: Vashe 5.8 (oz) 1 x Per Week/15 Days Discharge Instructions: Cleanse the wound with Vashe prior to applying a clean dressing using gauze sponges, not tissue or cotton balls. Peri-Wound Care: Sween Lotion (Moisturizing  lotion) 1 x Per Week/15 Days Discharge Instructions: Apply moisturizing lotion as directed Prim Dressing: Hydrofera Blue Ready Transfer Foam, 4x5 (in/in) 1 x Per Week/15 Days ary Discharge Instructions: Apply to wound bed as instructed Secondary Dressing: ABD Pad, 8x10 1 x Per Week/15 Days Discharge Instructions: Apply over primary dressing as directed. Secondary Dressing: Optifoam Non-Adhesive Dressing, 4x4 in 1 x Per Week/15 Days Discharge Instructions: Apply to ankle, achilles, lateral part of foot for protection Compression Wrap: Urgo K2, (equivalent to a 4 layer) two layer compression system, regular 1 x Per Week/15 Days Discharge Instructions: Apply Urgo K2 as directed (alternative to 4 layer compression). Wound #15 - Lower Leg Wound Laterality: Right, Anterior Cleanser: Soap and Water 1 x Per Week/15 Days Discharge Instructions: May shower and wash wound with dial antibacterial soap and water prior to dressing change. Cleanser: Vashe 5.8 (oz) 1 x Per Week/15 Days Discharge Instructions: Cleanse the wound with Vashe prior to applying a clean dressing using gauze sponges, not tissue or cotton balls. Peri-Wound Care: Sween Lotion (Moisturizing lotion) 1 x Per Week/15 Days Discharge Instructions: Apply moisturizing lotion as directed Prim Dressing: Hydrofera Blue Ready Transfer Foam, 4x5 (in/in) 1 x Per Week/15 Days ary Discharge Instructions: Apply to wound bed as instructed Secondary Dressing: ABD Pad, 8x10 1 x Per Week/15 Days Discharge Instructions: Apply over primary dressing as directed. Secondary Dressing: Optifoam Non-Adhesive Dressing, 4x4 in 1 x Per Week/15 Days Discharge Instructions: Apply to ankle, achilles, lateral part of foot for protection Compression Wrap: Urgo K2, (equivalent to a 4 layer) two layer compression system, regular 1 x Per Week/15 Days Discharge Instructions: Apply Urgo K2 as directed (alternative to 4 layer compression). Wound #16 - Lower Leg Wound  Laterality: Left, Lateral Cleanser: Soap and Water 1 x Per Week/15 Days Discharge Instructions: May shower and wash wound with dial antibacterial soap and water prior to dressing change. Cleanser: Vashe 5.8 (oz) 1 x Per Week/15 Days Discharge Instructions: Cleanse the wound with Vashe prior to applying a clean dressing using gauze sponges, not tissue or cotton balls. Peri-Wound Care: Sween Lotion (Moisturizing lotion) 1 x Per Week/15 Days Discharge Instructions: Apply moisturizing lotion as directed Prim Dressing: Hydrofera Blue Ready Transfer Foam, 4x5 (in/in) 1 x Per Week/15 Days ary Discharge Instructions: Apply to wound bed as instructed Secondary Dressing: ABD Pad, 8x10 1 x Per Week/15 Days Discharge Instructions: Apply over primary dressing as directed. Secondary Dressing: Optifoam Non-Adhesive Dressing,  4x4 in 1 x Per Week/15 Days Discharge Instructions: Apply to ankle, achilles, lateral part of foot for protection Compression Wrap: Urgo K2, (equivalent to a 4 layer) two layer compression system, regular 1 x Per Week/15 Days Discharge Instructions: Apply Urgo K2 as directed (alternative to 4 layer compression). Electronic Signature(s) Signed: 05/05/2023 4:19:19 PM By: [name] Najjar MD Signed: 05/05/2023 4:47:01 PM By: [name] Stall RN, BSN Entered By: [name] on 05/05/2023 10:52:32 [name] ([id number]) [id number].pdf Page 8 of 14 -------------------------------------------------------------------------------- Problem List Details Patient Name: Date of Service: [name], [name] 05/05/2023 1:15 PM Medical Record Number: [id number] Patient Account Number: [id number] Date of Birth/Sex: Treating RN: 1955/07/09 (67 y.o. 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 05/05/2023 1:15 PM Medical Record Number: [id number] Patient Account Number: [id number] Date of Birth/Sex: Treating RN: 01-21-1956 (67 y.o. M) Primary Care Provider: Hillard [name] Other Clinician: Referring Provider: Treating Provider/Extender: [name] in Treatment: 9 Subjective History of Present Illness (HPI) ADMISSION CEAZAR, BOGIE ([id number]) [phone].pdf Page 9 of 14 07/29/2018 Mr. Encinias is a 67 year old man with either prediabetes or diabetes he is on glipizide. In late October to November 2019 he noted a scabbed area on the back of his right calf. He picked this off a few times but it would not heal. He saw his primary physician on 12/5. It was felt at that time that this may actually heal on its own with conservative management. The next visit was on 07/20/2018 noted the area was a lot worse and arranged for his treatment here. He has been topical antibiotics like Neosporin although he stopped using this when the wound looked worse. He is just been  covering this with a clean Band-Aid. He is given Bactrim a week ago and he is finishing this currently. It is made some improvement in the surrounding erythema per the patient. He does not have a history of nonhealing wounds. No prior history of wounds on his legs that he had difficulty healing. No prior skin issues. 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 Past medical history; A. fib status post ablation and recent cardioversion he is on Xarelto. He has a history of systolic heart failure, cervical radiculopathy, cardiomyopathy and type 2 diabetes as discussed ABI in the right leg was noncompressible on the right 2/20; the biopsy I did on the patient last week was negative for malignancy. Culture grew Pseudomonas and methicillin sensitive staph aureus. He is on cefdinir 300 twice a day. I will have to make this a 10 day course. He put him in compression. He states that the redness pain and erythema are a lot better. I suspect the patient has  chronic venous insufficiency probably with a secondary cellulitis 2/27; arrives today with copious amounts of drainage coming out of the wound irritating the skin below the wound area. He only renewed the final 3 days of cefdinir today 3/5; came in for a nurse change 3 days ago. Again a lot of drainage noted. Zinc oxide was applied unfortunately the drainage appears to a pool then he has a string of superficial open areas extending down into the Achilles area and some just below the wound. The wound itself does not look too bad. He is completed the antibiotics although apparently there was separation from the original 7 with the last 3 days. Culture grew a few MSSA and a few Pseudomonas 3/12; not too much difference over the last week. He came in for a dressing change on Monday by our nursing staff. Necrotic debris again over the wound surface. The entire area looks irritated but nontender and I do not think shows obvious evidence of infection. We  have not heard anything about the reflux studies. Also notable than in this diabetic man we had noncompressible vessels and although I can feel his pulses easily in his feet I will order arterial studies as well. 3/19-Patient had experience more pain has been dressed with a silver alginate, the wounds all have necrotic debris, very friable with easy bleeding with any kind of surface debridement including with gauze and Anasept and with a #3 curette. His vascular appointments unfortunately were all canceled on account of the virus outbreak resulting in studies only being done for emergent cases. His pulses are easily palpable in the lower extremity. He has open areas below the primary wound where he states the drainage which included purulent material also with some blood pooled and caused breakdown. 3/26; the patient was changed to Santyl with [facility] backing last week. This was largely because the silver alginate was sticking to the wound. He is still having quite a bit of pain. He tells [place] that the reflux studies and arterial studies we had attempted to arrange through vein and vascular are not being booked until mid May. Culture that was done last week showed both Serratia moderate and a few methicillin sensitive staph aureus I started him on Bactrim DS 1 p.o. twice daily on 3/23 for 7 days. He is here for follow-up. 4/3; patient is on Santyl with Hydrofera Blue. Patient complains of pain. Our intake nurse is noted drainage and some odor. He has completed Bactrim recently for Serratia and a few methicillin sensitive staph aureus. After our staff push hard last week we were able to get venous studies as well as arterial studies. His arterial studies showed on the right great toe pressure of 0.86. On the left ABI at 1.47 TBI of 0.87. On both sides waveforms were triphasic VENOUS STUDIES showed reflux in the femoral vein, popliteal vein great saphenous vein at the saphenofemoral junction and  great saphenous vein at the proximal thigh. Also noted to have age-indeterminate superficial vein thrombosis involving the small saphenous vein. Notable that the patient is already on Xarelto for atrial fibrillation. 4/9; the patient has been to see vascular surgery and reviewed by Dr. Durwin Nora. He was felt to have only minimal superficial venous reflux in the right great greater saphenous vein. For this reason he was not felt to be a candidate for laser ablation of the right greater saphenous vein. A wedge cushion was suggested to keep his leg elevated. He does have deep venous reflux on the right.  Culture I did last time showed a combination of Serratia Pseudomonas and methicillin sensitive staph aureus. We have been using Hydrofera Blue to the large wound, Santyl to some of the deep punched out satellite lesions distal to it. There is some improvement 4/16; the application for Apligraf was put out for further review for material that we have resubmitted. He has the deep area on the right posterior calf which is large and then 3 small punched out areas beneath this. In general his wounds look a lot better 4/23; the patient has his large original wound and then 2 punched out satellite lesions below it on the right posterior calf. I was usually able to use Apligraf #1 to cover the full surface area of the larger wound. We continued with Santyl and Hydrofera Blue on the 2 punched-out areas 5/7; patient has his large original wound and 2 punched out satellite lesions below it in the right posterior calf. Apligraf #2 today. Major improvement in the big wound in terms of wound depth. Punched-out [place] wounds have a very healthy looking wound bed. 5/21-Patient comes back for his large right posterior calf wound for which she has been an Apligraf #3 today. Wound depth seems to be better, the punched-out wounds appear to have healthy bed with some bleeding 6/4; right posterior calf wound is much better.  Apligraf #4 today. The major wound has no wound depth at all. Only 1 of the satellite lesions is still open. The patient has very high blood pressure coming in today with a diastolic blood pressure of 130 after lying there for a few minutes it was down to 110. He states that is running 100 210 diastolic at home. He was high last week as well I thought he was on 50 mg 1/2 tablet twice daily of losartan I told him to double up on that however it turns out he is actually on 50 twice daily. Course he is run out of his medications prematurely. He has a follow-up with his primary's office next Wednesday. 6/18; patient's wounds continue to improve. The small area laterally is just about closed and there is continued epithelialization on the area on the posterior calf. 7/2; we applied his last Apligraf last visit. Early the next week there was a lot of purulent looking drainage that I cultured this grew staph and Pseudomonas however when we had him back for the next visit to 3 days later everything looked a lot better. He did not receive systemic antibiotics. Fortunately we did not have to remove the Apligraf. He has had heart trouble this week he was having an ablation apparently they have discovered a clot in his left atrium they have changed all his anticoagulants as well as his blood pressure medication. 7/16; using Hydrofera Blue. We are making nice progress towards closure. He has not yet ordered his compression stockings he has his measurements 7/23; dimensions are better. He has a new satellite area superiorly. Both wounds cauterized with silver nitrate. 7/30; absolutely no change in the major wound on the posterior part of the right calf. We have been using Hydrofera Blue for a prolonged period of time and really had a nice improvement but over the last few weeks this is stalled. There is no depth. He arrived in clinic today with a new area on the right anterior tibia which apparently was an "ingrown  hair". It is clearly an open area. This looks like a wrap injury although he was not really aware of it.  8/31; since the patient was last in clinic he was admitted to hospital from 8/17 through 8/22. He had had multiple falls at home including one off a ladder. He was admitted to hospital with mild COVID-19 viral pneumonia. Noted to be hyperkalemic and in acute renal failure. He has chronic systolic heart failure with ejection fraction of 35%. He arrives back in clinic with the original wound on the posterior aspect of the right lower calf looking a lot better. He has eschared areas from falls and lacerations on his anterior knees bilaterally just below the patella and on the mid part of his tibia bilaterally. He seems to have a small area on the right lateral ankle as well 9/10- Patient comes to clinic with a 2 new wounds one on the left anterior shin and one on the right posterior leg both the left knee and right knee areas are healed up. We have been using Santyl to the left anterior leg and silver alginate to right posterior leg wounds. 9/17; the patient is original wound looks good on the posterior right calf. He has traumatic areas on the left anterior shin left anterior patellar tendon and the right mid tibia area. Except for the right posterior calf all required debridement. We have been using Santyl on these areas and silver alginate posteriorly right calf 9/24; the original wound on the posterior calf on the right looks good. This is healthy. There are traumatic wounds in the left anterior shin, left anterior patella in [place] ([id number]) [phone].pdf Page 10 of 14 the right mid tibia area are about the same. He has dusky erythema on the left anterior tibia which led me to give him antibiotics last week. This does not look too much different. This is nontender and I wonder if this is all just venous inflammation. 10/2; the patient has 2 open areas on  the right posterior calf both of these look good we have been using silver alginate. He has traumatic wounds on the left and right mid tibia areas. The surface of these wounds is still not ready for closure. We have been using Iodoflex. Finally has areas on the left knee. The latter wounds are all traumatic after a fall 10/9. His original wounds of the right posterior calf are superficial and progressing towards closure. His traumatic wounds on the left anterior tibia and right anterior tibia are considerably improved in terms of the wound surface. He asked me about a skin graft in these areas I do not think this is going to be necessary. Change from Iodosorb to [facility]. The area on the left knee is just about closed as well 10/16; his original wound on the right posterior calf very superficial looks healthy. He has more recent traumatic wounds on the left anterior tibia and right anterior tibia both of these have better looking surfaces and measuring smaller. Might be beneficial for an advanced treatment product. The area on his left knee has a small superficial scab and we are going to close that when 10/23; his original wound areas on the right posterior calf continue to improve. The more recently traumatic wounds on the left anterior tibia and right anterior tibia both look better in terms of surfaces. No debridement required. We have been using Hydrofera Blue. He is approved for Apligraf 11/3; Apligraf #1 applied to the left anterior tibia right anterior tibia and to 2 small areas remaining on the right posterior calf which were part of his initial wound area. 11/19;  Apligraf #2. Left anterior tibia is healed. Right anterior tibia much better. Only one small area remains on the right posterior calf which was part of his initial wound area 12/3; the left anterior tibia has opened up again. Hyper granulated nodule. Right anterior tibia is superficial and larger. There is no depth of this. I  did not place Apligraf today return to [facility] under compression 12/10; the patient has 2 remaining wounds 1 on the right posterior and the other on the left anterior. Both of these look quite good. No debridement is required we have been using Hydrofera Blue under compression. 12/17; the patient has really not closed over which is disappointing. His wraps were very tight and there may be some wrap injury issues. On the left he has the original wound on the medial mid tibia he has a wrap injury on the lateral mid tibia. On the right his posterior areas are superficial but certainly not closed 12/31; the patient has totally closed on the left. He will transition to his own 20/30 below-knee stocking on the left leg today. Interestingly on the right posterior calf the 2 wounds that he had from last time of closed however there is an additional injury in this same area. Almost looks as though there is subcutaneous fluid/blistering. There is no evidence of cellulitis this does not seem tender 06/24/2019; the patient comes in having been placed in his 20/30 stockings on the left leg last week. He states that he noted blisters break open on Saturday and they had opened into wounds on the anterior tibial area on the left by Monday. The area on the posterior right leg looks satisfactory we are still doing that and compression. We are using calcium alginate to all wound areas 1/14; the patient had juxta light however one of them was not the right size which will need to be returned. The new area last week on the left anterior tibia looks a lot better it measures smaller I would say the area on the right is about the same, this is on the right posterior calf. We have been using silver alginate 1/21; he has his bilateral juxta lites however the left anterior wound continues to look better where is the 2 areas on the right posterior calf look about the same. We have been using Hydrofera Blue under compression  with the juxta lite stockings now on standby 2/4; the areas on the left are totally healed and he will be discharged into his own stocking/juxta light. On the right posterior calf he has what looks to be a blister with a small area of subdermal hemorrhage this is very tiny but I did I am going to wrap him today and see if this will be reabsorbed by next week 2/11; he comes in this week with expanded wounds on the posterior calf as well as a new wound on the left anterior. He said the left anterior started a few days ago with a blister that is now surface. He is using his juxta lite stockings at a pressure of 30/40 2/19; we wrapped his left leg last week after a failed attempt to discharge that leg and his juxta lites 2 weeks ago. The leg is again healed he has superficial areas posteriorly on the right leg 2/26; the patient came in initially with right posterior calf wound secondary to minor trauma with secondary infection. He went on to develop a left lower leg wound. In the interim he had congestive heart failure  with acute renal failure a fall with bilateral anterior leg wounds and finally Covid requiring admission to pneumonia with pneumonia. He is finally closed. He has external compression stockings bilaterally [juxta lite stockings]. His wounds are healed READMISSION 03/03/2023 This is a patient that we had here for a year from 20 20 through 20 21 with a sizable wound on the right posterior calf which was secondary to a combination of chronic venous insufficiency and secondary infection in the wound when he came here. This was a problematic wound in itself however he developed traumatic wounds on his lower extremities after a fall from a ladder during the same admission. Eventually he was healed out and discharged and juxta lite stockings. He tells me that everything was fine up until about 2-1/2 months ago he developed a blister and an open area on the left posterior calf this time. He saw  his primary doctor and received a course of Bactrim. He has been using Silvadene with AandE ointment and covering this with gauze. He is here for review of this area. When he was here in 2021 he also saw Dr. Woodfin Ganja of vein and vascular he had venous reflux studies that showed deep venous reflux but nothing that was amenable to surgery. He also had arterial studies. He was noncompressible on the left but had normal TBIs and triphasic waveforms. In terms of the reflux studies I am not really sure whether the left leg was involved in that study or not we will need to review that. His past medical history includes type type 2 diabetes, chronic venous insufficiency with secondary lymphedema, intermittent A-fib, nonischemic cardiomyopathy, thrombus of the left atrial appendage, hereditary hemochromatosis cytosis. He is now on Coumadin presumably for the atrial fibrillation. ABIs in this clinic were not repeated 9/24; posterior left calf wound. We used Hydrofera Blue under compression. The wound looks better this week better looking surface. 10/1. Posterior left calf wound secondary to venous insufficiency. He was new to the clinic in 2 weeks ago at which time I thought he was going to require a difficult set of mechanical debridements however the wound surface is continuing to improve using Hydrofera Blue although the dimensions not so remarkably improved today 10/8; posterior left calf wound secondary to chronic venous insufficiency. He continues to think nice progress here on this wound the wound is smaller better looking surface. He arrived in clinic today with an abrasion on his Achilles area on the left leg as well as what looked to be a potential deep tissue injury on the lateral lateral part of the left foot at the level of the base of the fifth metatarsal. 10/15 posterior left calf wound continues to improve. We have been using Prisma under ABDs under Urgo K2 lite's. The areas on the Achilles  and the left lateral foot which were concerning last time of maintain skin integrity. He is not wearing stockings even on the right leg I talked to him about this. 10/22; posterior left calf wound measures slightly larger today. The abrasion injury in the Achilles send on the lateral fifth metatarsal base look improved. We have been using Prisma under compression I am going to change this to [facility] today 10/29; posterior left calf wound continues to progress nicely. Presumably an abrasion injury on the Achilles heel. I changed dressing to Hydrofera Blue. Will need to dress the area on the Achilles today. The area on the fifth metatarsal base is not open 11/5; posterior left calf wound continues  to improve nicely presumably an abrasion injury on the left Achilles also looks clean. He has a what looks to be an abrasion on the base of the fifth metatarsal it is not open but it looks as though there is been some friction on this area we will pad this today under the wrap. Patient with chronic venous insufficiency severe hemosiderin deposition and resultant wounds. He has been in the clinic before however that was for a wound on the right posterior calf. Notable he is not wearing stockings even on the uninvolved right leg today 11/11; his original wound on the posterior left calf continues to improve in dimensions. Once again he comes in today with some black eschar around the edge I [name], [name] ([id number]) [id number] Page 11 of 14 wonder if this is bleeding. He has the area on the Achilles and now a new area on the left upper lower leg which he says he scratched. And finally he has an area on the right anterior mid tibia which he said he got from taking off his stocking 11/19; he has wounds on his bilateral lower legs. We have been wrapping with Urgo K2's. He has original wound on the left posterior calf, probably a wrap injury on the left lateral calf, area on  the left Achilles, and finally the right medial lower leg. We have been using Hydrofera Blue Objective Constitutional Sitting or standing Blood Pressure is within target range for patient.. Pulse regular and within target range for patient.[name] Respirations regular, non-labored and within target range.. Temperature is normal and within the target range for the patient.[name] Appears in no distress. Vitals Time Taken: 1:12 PM, Height: 73 in, Weight: 254 lbs, BMI: 33.5, Temperature: 97.8 F, Pulse: 74 bpm, Respiratory Rate: 18 breaths/min, Blood Pressure: 105/70 mmHg, Capillary Blood Glucose: 137 mg/dl. General Notes: Wound exam; left posterior calf is his original wound. Completely surrounded by eschar which I removed also some eschar inferiorly using a #3 curette. I removed skin and eschar from around the Achilles area and eschar from the left lateral calf. I did not debride the right leg Integumentary (Hair, Skin) Wound #13 status is Open. Original cause of wound was Blister. The date acquired was: 01/20/2023. The wound has been in treatment 9 weeks. The wound is located on the Left,Posterior Lower Leg. The wound measures 1cm length x 1cm width x 0.1cm depth; 0.785cm^2 area and 0.079cm^3 volume. There is Fat Layer (Subcutaneous Tissue) exposed. There is no tunneling or undermining noted. There is a medium amount of serosanguineous drainage noted. The wound margin is distinct with the outline attached to the wound base. There is large (67-100%) red, friable granulation within the wound bed. There is no necrotic tissue within the wound bed. The periwound skin appearance exhibited: Hemosiderin Staining. The periwound skin appearance did not exhibit: Callus, Crepitus, Excoriation, Induration, Rash, Scarring, Dry/Scaly, Maceration, Atrophie Blanche, Cyanosis, Ecchymosis, Mottled, Pallor, Rubor, Erythema. Periwound temperature was noted as No Abnormality. Wound #14 status is Open. Original cause of wound was  Blister. The date acquired was: 04/07/2023. The wound has been in treatment 3 weeks. The wound is located on the Left Calcaneus. The wound measures 1.2cm length x 1cm width x 0.1cm depth; 0.942cm^2 area and 0.094cm^3 volume. There is Fat Layer (Subcutaneous Tissue) exposed. There is no tunneling or undermining noted. There is a medium amount of serosanguineous drainage noted. There is large (67- 100%) granulation within the wound bed. There is no necrotic tissue within the wound bed. The periwound  skin appearance did not exhibit: Callus, Crepitus, Excoriation, Induration, Rash, Scarring, Dry/Scaly, Maceration, Atrophie Blanche, Cyanosis, Ecchymosis, Hemosiderin Staining, Mottled, Pallor, Rubor, Erythema. Wound #15 status is Open. Original cause of wound was Trauma. The date acquired was: 02/25/2023. The wound has been in treatment 1 weeks. The wound is located on the Right,Anterior Lower Leg. The wound measures 2cm length x 1.8cm width x 0.1cm depth; 2.827cm^2 area and 0.283cm^3 volume. There is Fat Layer (Subcutaneous Tissue) exposed. There is no tunneling or undermining noted. There is a medium amount of serosanguineous drainage noted. There is large (67-100%) red, pink granulation within the wound bed. There is a small (1-33%) amount of necrotic tissue within the wound bed including Adherent Slough. The periwound skin appearance did not exhibit: Callus, Crepitus, Excoriation, Induration, Rash, Scarring, Dry/Scaly, Maceration, Atrophie Blanche, Cyanosis, Ecchymosis, Hemosiderin Staining, Mottled, Pallor, Rubor, Erythema. Wound #16 status is Open. Original cause of wound was Gradually Appeared. The date acquired was: 04/27/2023. The wound has been in treatment 1 weeks. The wound is located on the Left,Lateral Lower Leg. The wound measures 0.5cm length x 0.5cm width x 0.1cm depth; 0.196cm^2 area and 0.02cm^3 volume. There is no tunneling or undermining noted. There is a none present amount of drainage  noted. The wound margin is distinct with the outline attached to the wound base. There is no granulation within the wound bed. There is a large (67-100%) amount of necrotic tissue within the wound bed including Eschar. The periwound skin appearance did not exhibit: Callus, Crepitus, Excoriation, Induration, Rash, Scarring, Dry/Scaly, Maceration, Atrophie Blanche, Cyanosis, Ecchymosis, Hemosiderin Staining, Mottled, Pallor, Rubor, Erythema. Periwound temperature was noted as No Abnormality. Assessment Active Problems ICD-10 Non-pressure chronic ulcer of other part of left lower leg with other specified severity Chronic venous hypertension (idiopathic) with ulcer and inflammation of left lower extremity Non-pressure chronic ulcer of left ankle with other specified severity Type 2 diabetes mellitus with other skin ulcer Non-pressure chronic ulcer of other part of right lower leg with other specified severity Procedures Wound #13 Pre-procedure diagnosis of Wound #13 is a Venous Leg Ulcer located on the Left,Posterior Lower Leg .Severity of Tissue Pre Debridement is: Fat layer exposed. There was a Selective/Open Wound Non-Viable Tissue Debridement with a total area of 0.78 sq cm performed by [name] Caul., MD. With the following instrument(s): Curette to remove Non-Viable tissue/material. Material removed includes Eschar after achieving pain control using Lidocaine 4% Topical Solution. A time out was conducted at 13:40, prior to the start of the procedure. A Minimum amount of bleeding was controlled with Pressure. The procedure was tolerated well with a pain level of 0 throughout and a pain level of 0 following the procedure. Post Debridement Measurements: 1cm length x 1cm width x 0.1cm depth; 0.079cm^3 volume. Character of Wound/Ulcer Post Debridement is improved. Severity of Tissue Post Debridement is: Fat layer exposed. Post procedure Diagnosis Wound #13: Same as Pre-Procedure [name], [name] ([id number]) [phone].pdf Page 12 of 14 Pre-procedure diagnosis of Wound #13 is a Venous Leg Ulcer located on the Left,Posterior Lower Leg . There was a Double Layer Compression Therapy Procedure by Fonnie Mu, RN. Post procedure Diagnosis Wound #13: Same as Pre-Procedure Wound #14 Pre-procedure diagnosis of Wound #14 is an Abrasion located on the Left Calcaneus . There was a Selective/Open Wound Skin/Epidermis Debridement with a total area of 0.94 sq cm performed by [name] Caul., MD. With the following instrument(s): Curette to remove Viable and Non-Viable tissue/material. Material removed includes Eschar, Skin: Dermis, and Skin:  Epidermis after achieving pain control using Lidocaine 4% T opical Solution. A time out was conducted at 13:40, prior to the start of the procedure. A Minimum amount of bleeding was controlled with Pressure. The procedure was tolerated well with a pain level of 0 throughout and a pain level of 0 following the procedure. Post Debridement Measurements: 1.2cm length x 1cm width x 0.1cm depth; 0.094cm^3 volume. Character of Wound/Ulcer Post Debridement is improved. Post procedure Diagnosis Wound #14: Same as Pre-Procedure Pre-procedure diagnosis of Wound #14 is an Abrasion located on the Left Calcaneus . There was a Double Layer Compression Therapy Procedure by Fonnie Mu, RN. Post procedure Diagnosis Wound #14: Same as Pre-Procedure Wound #16 Pre-procedure diagnosis of Wound #16 is a Venous Leg Ulcer located on the Left,Lateral Lower Leg .Severity of Tissue Pre Debridement is: Fat layer exposed. There was a Selective/Open Wound Non-Viable Tissue Debridement with a total area of 0.2 sq cm performed by [name] Caul., MD. With the following instrument(s): Curette to remove Non-Viable tissue/material. Material removed includes Eschar after achieving pain control using Lidocaine 4% T opical Solution. A time out was conducted  at 13:40, prior to the start of the procedure. A Minimum amount of bleeding was controlled with Pressure. The procedure was tolerated well with a pain level of 0 throughout and a pain level of 0 following the procedure. Post Debridement Measurements: 0.5cm length x 0.5cm width x 0.1cm depth; 0.02cm^3 volume. Character of Wound/Ulcer Post Debridement is improved. Severity of Tissue Post Debridement is: Fat layer exposed. Post procedure Diagnosis Wound #16: Same as Pre-Procedure Pre-procedure diagnosis of Wound #16 is a Venous Leg Ulcer located on the Left,Lateral Lower Leg . There was a Double Layer Compression Therapy Procedure by Fonnie Mu, RN. Post procedure Diagnosis Wound #16: Same as Pre-Procedure Wound #15 Pre-procedure diagnosis of Wound #15 is an Abrasion located on the Right,Anterior Lower Leg . There was a Double Layer Compression Therapy Procedure by Fonnie Mu, RN. Post procedure Diagnosis Wound #15: Same as Pre-Procedure Plan Follow-up Appointments: Return Appointment in 2 weeks. - 05/19/2023 (already scheduled ) Dr. Leanord Hawking Nurse Visit: - 05/12/2023 130pm (already scheduled) Anesthetic: (In clinic) Topical Lidocaine 4% applied to wound bed Bathing/ Shower/ Hygiene: May shower with protection but do not get wound dressing(s) wet. Protect dressing(s) with water repellant cover (for example, large plastic bag) or a cast cover and may then take shower. Edema Control - Orders / Instructions: Elevate legs to the level of the heart or above for 30 minutes daily and/or when sitting for 3-4 times a day throughout the day. Avoid standing for long periods of time. Exercise regularly WOUND #13: - Lower Leg Wound Laterality: Left, Posterior Cleanser: Soap and Water 1 x Per Week/15 Days Discharge Instructions: May shower and wash wound with dial antibacterial soap and water prior to dressing change. Cleanser: Vashe 5.8 (oz) 1 x Per Week/15 Days Discharge Instructions:  Cleanse the wound with Vashe prior to applying a clean dressing using gauze sponges, not tissue or cotton balls. Peri-Wound Care: Sween Lotion (Moisturizing lotion) 1 x Per Week/15 Days Discharge Instructions: Apply moisturizing lotion as directed Prim Dressing: Hydrofera Blue Ready Transfer Foam, 4x5 (in/in) 1 x Per Week/15 Days ary Discharge Instructions: Apply to wound bed as instructed Secondary Dressing: ABD Pad, 8x10 1 x Per Week/15 Days Discharge Instructions: Apply over primary dressing as directed. Secondary Dressing: Optifoam Non-Adhesive Dressing, 4x4 in 1 x Per Week/15 Days Discharge Instructions: Apply to ankle, achilles, lateral part of foot for protection  Com pression Wrap: Urgo K2, (equivalent to a 4 layer) two layer compression system, regular 1 x Per Week/15 Days Discharge Instructions: Apply Urgo K2 as directed (alternative to 4 layer compression). WOUND #14: - Calcaneus Wound Laterality: Left Cleanser: Soap and Water 1 x Per Week/15 Days Discharge Instructions: May shower and wash wound with dial antibacterial soap and water prior to dressing change. Cleanser: Vashe 5.8 (oz) 1 x Per Week/15 Days Discharge Instructions: Cleanse the wound with Vashe prior to applying a clean dressing using gauze sponges, not tissue or cotton balls. Peri-Wound Care: Sween Lotion (Moisturizing lotion) 1 x Per Week/15 Days Discharge Instructions: Apply moisturizing lotion as directed Prim Dressing: Hydrofera Blue Ready Transfer Foam, 4x5 (in/in) 1 x Per Week/15 Days ary Discharge Instructions: Apply to wound bed as instructed Secondary Dressing: ABD Pad, 8x10 1 x Per Week/15 Days Discharge Instructions: Apply over primary dressing as directed. Secondary Dressing: Optifoam Non-Adhesive Dressing, 4x4 in 1 x Per Week/15 Days Discharge Instructions: Apply to ankle, achilles, lateral part of foot for protection [name], [name] ([id number]) [phone].pdf Page 13 of 14 Com  pression Wrap: Urgo K2, (equivalent to a 4 layer) two layer compression system, regular 1 x Per Week/15 Days Discharge Instructions: Apply Urgo K2 as directed (alternative to 4 layer compression). WOUND #15: - Lower Leg Wound Laterality: Right, Anterior Cleanser: Soap and Water 1 x Per Week/15 Days Discharge Instructions: May shower and wash wound with dial antibacterial soap and water prior to dressing change. Cleanser: Vashe 5.8 (oz) 1 x Per Week/15 Days Discharge Instructions: Cleanse the wound with Vashe prior to applying a clean dressing using gauze sponges, not tissue or cotton balls. Peri-Wound Care: Sween Lotion (Moisturizing lotion) 1 x Per Week/15 Days Discharge Instructions: Apply moisturizing lotion as directed Prim Dressing: Hydrofera Blue Ready Transfer Foam, 4x5 (in/in) 1 x Per Week/15 Days ary Discharge Instructions: Apply to wound bed as instructed Secondary Dressing: ABD Pad, 8x10 1 x Per Week/15 Days Discharge Instructions: Apply over primary dressing as directed. Secondary Dressing: Optifoam Non-Adhesive Dressing, 4x4 in 1 x Per Week/15 Days Discharge Instructions: Apply to ankle, achilles, lateral part of foot for protection Com pression Wrap: Urgo K2, (equivalent to a 4 layer) two layer compression system, regular 1 x Per Week/15 Days Discharge Instructions: Apply Urgo K2 as directed (alternative to 4 layer compression). WOUND #16: - Lower Leg Wound Laterality: Left, Lateral Cleanser: Soap and Water 1 x Per Week/15 Days Discharge Instructions: May shower and wash wound with dial antibacterial soap and water prior to dressing change. Cleanser: Vashe 5.8 (oz) 1 x Per Week/15 Days Discharge Instructions: Cleanse the wound with Vashe prior to applying a clean dressing using gauze sponges, not tissue or cotton balls. Peri-Wound Care: Sween Lotion (Moisturizing lotion) 1 x Per Week/15 Days Discharge Instructions: Apply moisturizing lotion as directed Prim Dressing: Hydrofera  Blue Ready Transfer Foam, 4x5 (in/in) 1 x Per Week/15 Days ary Discharge Instructions: Apply to wound bed as instructed Secondary Dressing: ABD Pad, 8x10 1 x Per Week/15 Days Discharge Instructions: Apply over primary dressing as directed. Secondary Dressing: Optifoam Non-Adhesive Dressing, 4x4 in 1 x Per Week/15 Days Discharge Instructions: Apply to ankle, achilles, lateral part of foot for protection Com pression Wrap: Urgo K2, (equivalent to a 4 layer) two layer compression system, regular 1 x Per Week/15 Days Discharge Instructions: Apply Urgo K2 as directed (alternative to 4 layer compression). 1. #1 continue with Hydrofera Blue, ABDs and Urgo K2 lite's 2-3 wound areas on the left and the  1 on the right. Both legs under compression. 2. His original wound is dramatically smaller 3. Fortunately no wound areas appear infected Electronic Signature(s) Signed: 05/05/2023 4:19:19 PM By: [name] Najjar MD Entered By: [name] on 05/05/2023 11:00:02 -------------------------------------------------------------------------------- SuperBill Details Patient Name: Date of Service: [name] Coco. 05/05/2023 Medical Record Number: [id number] Patient Account Number: [id number] Date of Birth/Sex: Treating RN: 1955/07/12 (67 y.o. Harlon Flor, Yvonne Kendall Primary Care Provider: Hillard [name] Other Clinician: Referring Provider: Treating Provider/Extender: [name] in Treatment: 9 Diagnosis Coding ICD-10 Codes Code Description [phone] Non-pressure chronic ulcer of other part of left lower leg with other specified severity I87.332 Chronic venous hypertension (idiopathic) with ulcer and inflammation of left lower extremity L97.328 Non-pressure chronic ulcer of left ankle with other specified severity E11.622 Type 2 diabetes mellitus with other skin ulcer L97.818 Non-pressure chronic ulcer of other part of right lower leg with other specified  severity Facility Procedures : DREDAN, TIMBERS Code: [id number] Brion J ([id number] (F Description: 7597 - DEBRIDE WOUND 1ST 20 SQ CM OR < ICD-10 Diagnosis Description L97.828 Non-pressure chronic ulcer of other part of left lower leg with other specified seve I87.332 Chronic venous hypertension (idiopathic) with ulcer and inflammation of  left lower e 527) [id number] acility Use Only) [id number] - APPLY MULTLAY COMPRS LWR RT LEG 59 Modifier: rity xtremity [id number] 1 Quantity: 1 f Page 14 of 14 Physician Procedures : CPT4 Code Description Modifier [id number] - WC PHYS DEBR WO ANESTH 20 SQ CM ICD-10 Diagnosis Description L97.828 Non-pressure chronic ulcer of other part of left lower leg with other specified severity I87.332 Chronic venous hypertension  (idiopathic) with ulcer and inflammation of left lower extremity Quantity: 1 Electronic Signature(s) Signed: 05/05/2023 4:19:19 PM By: [name] Najjar MD Entered By: [name] on 05/05/2023 11:00:14

## 2023-05-12 ENCOUNTER — Ambulatory Visit (INDEPENDENT_AMBULATORY_CARE_PROVIDER_SITE_OTHER): Payer: Medicare HMO

## 2023-05-12 ENCOUNTER — Encounter (HOSPITAL_BASED_OUTPATIENT_CLINIC_OR_DEPARTMENT_OTHER): Payer: Medicare HMO | Admitting: General Surgery

## 2023-05-12 DIAGNOSIS — L97328 Non-pressure chronic ulcer of left ankle with other specified severity: Secondary | ICD-10-CM | POA: Diagnosis not present

## 2023-05-12 DIAGNOSIS — I428 Other cardiomyopathies: Secondary | ICD-10-CM | POA: Diagnosis not present

## 2023-05-12 DIAGNOSIS — L97818 Non-pressure chronic ulcer of other part of right lower leg with other specified severity: Secondary | ICD-10-CM | POA: Diagnosis not present

## 2023-05-12 DIAGNOSIS — I4891 Unspecified atrial fibrillation: Secondary | ICD-10-CM

## 2023-05-12 DIAGNOSIS — I480 Paroxysmal atrial fibrillation: Secondary | ICD-10-CM | POA: Diagnosis not present

## 2023-05-12 DIAGNOSIS — L97828 Non-pressure chronic ulcer of other part of left lower leg with other specified severity: Secondary | ICD-10-CM | POA: Diagnosis not present

## 2023-05-12 DIAGNOSIS — E11622 Type 2 diabetes mellitus with other skin ulcer: Secondary | ICD-10-CM | POA: Diagnosis not present

## 2023-05-12 DIAGNOSIS — I5040 Unspecified combined systolic (congestive) and diastolic (congestive) heart failure: Secondary | ICD-10-CM | POA: Diagnosis not present

## 2023-05-12 DIAGNOSIS — I87332 Chronic venous hypertension (idiopathic) with ulcer and inflammation of left lower extremity: Secondary | ICD-10-CM | POA: Diagnosis not present

## 2023-05-12 DIAGNOSIS — Z7901 Long term (current) use of anticoagulants: Secondary | ICD-10-CM | POA: Diagnosis not present

## 2023-05-12 LAB — CUP PACEART REMOTE DEVICE CHECK
Date Time Interrogation Session: 20241126091105
Implantable Lead Connection Status: 753985
Implantable Lead Implant Date: 20231127
Implantable Lead Location: 753860
Implantable Lead Model: 436909
Implantable Lead Serial Number: 81547891
Implantable Pulse Generator Implant Date: 20231127
Pulse Gen Model: 429525
Pulse Gen Serial Number: 84932826

## 2023-05-12 NOTE — Progress Notes (Signed)
TRAYLON, OTTENS ([id number]) [id number] Page 1 of 8 Visit Report for 05/12/2023 Arrival Information Details Patient Name: Date of Service: [name], [name] 05/12/2023 1:30 PM Medical Record Number: [id number] Patient Account Number: [id number] Date of Birth/Sex: Treating RN: 09/29/1955 (67 y.o. M) Primary Care Shervin Cypert: Hillard Danker Other Clinician: Referring Dionisia Pacholski: Treating Johngabriel Verde/Extender: Abbie Sons in Treatment: 10 Visit Information History Since Last Visit Added or deleted any medications: No Patient Arrived: Ambulatory Any new allergies or adverse reactions: No Arrival Time: 13:26 Had a fall or experienced change in No Accompanied By: self activities of daily living that may affect Transfer Assistance: None risk of falls: Patient Identification Verified: Yes Signs or symptoms of abuse/neglect since last visito No Secondary Verification Process Completed: Yes Hospitalized since last visit: No Patient Requires Transmission-Based Precautions: No Implantable device outside of the clinic excluding No Patient Has Alerts: Yes cellular tissue based products placed in the center Patient Alerts: Patient on Blood Thinner since last visit: Has Dressing in Place as Prescribed: Yes Has Compression in Place as Prescribed: Yes Pain Present Now: No Electronic Signature(s) Signed: 05/12/2023 3:43:47 PM By: Thayer Dallas Entered By: Thayer Dallas on 05/12/2023 13:31:20 -------------------------------------------------------------------------------- Compression Therapy Details Patient Name: Date of Service: [name] 05/12/2023 1:30 PM Medical Record Number: [id number] Patient Account Number: [id number] Date of Birth/Sex: Treating RN: 1955-10-22 (67 y.o. M) Primary Care Emerita Berkemeier: Hillard Danker Other Clinician: Referring Braiden Presutti: Treating Adel Neyer/Extender: Abbie Sons in Treatment: 10 Compression Therapy Performed for Wound Assessment: Wound #13 Left,Posterior Lower Leg Performed By: Clinician Thayer Dallas, Compression Type: Double Layer Electronic Signature(s) Signed: 05/12/2023 3:43:47 PM By: Thayer Dallas Entered By: Thayer Dallas on 05/12/2023 15:37:21 -------------------------------------------------------------------------------- Compression Therapy Details Patient Name: Date of Service: [name] 05/12/2023 1:30 PM Medical Record Number: [id number] Patient Account Number: [id number] [name], [name] ([id number]) [id number] Page 2 of 8 Date of Birth/Sex: Treating RN: 1956-04-14 (67 y.o. M) Primary Care Mars Scheaffer: Other Clinician: Hillard Danker Referring Chasiti Waddington: Treating Ovadia Lopp/Extender: Abbie Sons in Treatment: 10 Compression Therapy Performed for Wound Assessment: Wound #14 Left Calcaneus Performed By: Clinician Thayer Dallas, Compression Type: Double Layer Electronic Signature(s) Signed: 05/12/2023 3:43:47 PM By: Thayer Dallas Entered By: Thayer Dallas on 05/12/2023 15:37:21 -------------------------------------------------------------------------------- Compression Therapy Details Patient Name: Date of Service: [name] 05/12/2023 1:30 PM Medical Record Number: [id number] Patient Account Number: [id number] Date of Birth/Sex: Treating RN: Dec 24, 1955 (67 y.o. M) Primary Care Evadne Ose: Hillard Danker Other Clinician: Referring Shantice Menger: Treating Corazon Nickolas/Extender: Abbie Sons in Treatment: 10 Compression Therapy Performed for Wound Assessment: Wound #15 Right,Anterior Lower Leg Performed By: Clinician Thayer Dallas, Compression Type: Double Layer Electronic Signature(s) Signed: 05/12/2023 3:43:47 PM By: Thayer Dallas Entered By: Thayer Dallas on 05/12/2023  15:37:21 -------------------------------------------------------------------------------- Compression Therapy Details Patient Name: Date of Service: [name] 05/12/2023 1:30 PM Medical Record Number: [id number] Patient Account Number: [id number] Date of Birth/Sex: Treating RN: 11-13-1955 (67 y.o. M) Primary Care Patricia Fargo: Hillard Danker Other Clinician: Referring Maurene Hollin: Treating Tamiyah Moulin/Extender: Abbie Sons in Treatment: 10 Compression Therapy Performed for Wound Assessment: Wound #16 Left,Lateral Lower Leg Performed By: Clinician Thayer Dallas, Compression Type: Double Layer Electronic Signature(s) Signed: 05/12/2023 3:43:47 PM By: Thayer Dallas Entered By: Thayer Dallas on 05/12/2023 15:37:21 -------------------------------------------------------------------------------- Encounter Discharge Information Details Patient Name: Date of Service: [name] 05/12/2023 1:30 PM Medical Record Number: [id number] Patient Account Number: [id number] Date of Birth/Sex: Treating RN: 07-18-1955 (67 y.o. M)  [name], [name] ([id number]) [id number] Page 3 of 8 Primary Care Yetunde Leis: Hillard Danker Other Clinician: Thayer Dallas Referring Kysean Sweet: Treating Naasir Carreira/Extender: Abbie Sons in Treatment: 10 Encounter Discharge Information Items Discharge Condition: Stable Ambulatory Status: Ambulatory Discharge Destination: Home Transportation: Private Auto Accompanied By: self Schedule Follow-up Appointment: Yes Clinical Summary of Care: Electronic Signature(s) Signed: 05/12/2023 3:43:47 PM By: Thayer Dallas Entered By: Thayer Dallas on 05/12/2023 15:38:12 -------------------------------------------------------------------------------- Patient/Caregiver Education Details Patient Name: Date of Service: [name] 11/26/2024andnbsp1:30 PM Medical Record  Number: [id number] Patient Account Number: [id number] Date of Birth/Gender: Treating RN: 05/01/1956 (67 y.o. M) Primary Care Physician: Hillard Danker Other Clinician: Thayer Dallas Referring Physician: Treating Physician/Extender: Abbie Sons in Treatment: 10 Education Assessment Education Provided To: Patient Education Topics Provided Electronic Signature(s) Signed: 05/12/2023 3:43:47 PM By: Thayer Dallas Entered By: Thayer Dallas on 05/12/2023 15:37:52 -------------------------------------------------------------------------------- Wound Assessment Details Patient Name: Date of Service: [name], [name] 05/12/2023 1:30 PM Medical Record Number: [id number] Patient Account Number: [id number] Date of Birth/Sex: Treating RN: April 01, 1956 (67 y.o. M) Primary Care Zakara Parkey: Hillard Danker Other Clinician: Referring Georgiann Neider: Treating Jaquasha Carnevale/Extender: Abbie Sons in Treatment: 10 Wound Status Wound Number: 13 Primary Etiology: Venous Leg Ulcer Wound Location: Left, Posterior Lower Leg Secondary Etiology: Diabetic Wound/Ulcer of the Lower Extremity Wounding Event: Blister Wound Status: Open Date Acquired: 01/20/2023 Weeks Of Treatment: 10 Clustered Wound: No Wound Measurements JAMELLE, JEANETTE ([id number]) Length: (cm) 1 Width: (cm) 1 Depth: (cm) 0.1 Area: (cm) 0.785 Volume: (cm) 0.079 [id number] Page 4 of 8 % Reduction in Area: 96% % Reduction in Volume: 98% Wound Description Classification: Full Thickness Without Exposed Suppor Exudate Amount: Medium Exudate Type: Serosanguineous Exudate Color: red, brown t Structures Periwound Skin Texture Texture Color No Abnormalities Noted: No No Abnormalities Noted: No Moisture No Abnormalities Noted: No Treatment Notes Wound #13 (Lower Leg) Wound Laterality: Left, Posterior Cleanser Soap and Water Discharge Instruction:  May shower and wash wound with dial antibacterial soap and water prior to dressing change. Vashe 5.8 (oz) Discharge Instruction: Cleanse the wound with Vashe prior to applying a clean dressing using gauze sponges, not tissue or cotton balls. Peri-Wound Care Sween Lotion (Moisturizing lotion) Discharge Instruction: Apply moisturizing lotion as directed Topical Primary Dressing Hydrofera Blue Ready Transfer Foam, 4x5 (in/in) Discharge Instruction: Apply to wound bed as instructed Secondary Dressing ABD Pad, 8x10 Discharge Instruction: Apply over primary dressing as directed. Optifoam Non-Adhesive Dressing, 4x4 in Discharge Instruction: Apply to ankle, achilles, lateral part of foot for protection Secured With Compression Wrap Urgo K2, (equivalent to a 4 layer) two layer compression system, regular Discharge Instruction: Apply Urgo K2 as directed (alternative to 4 layer compression). Compression Stockings Add-Ons Electronic Signature(s) Signed: 05/12/2023 3:43:47 PM By: Thayer Dallas Entered By: Thayer Dallas on 05/12/2023 13:31:50 -------------------------------------------------------------------------------- Wound Assessment Details Patient Name: Date of Service: [name], [name] NDY J. 05/12/2023 1:30 PM Medical Record Number: [id number] Patient Account Number: [id number] Date of Birth/Sex: Treating RN: 01-Apr-1956 (67 y.o. M) Primary Care Raja Caputi: Hillard Danker Other Clinician: Referring Sobia Karger: Treating Latese Dufault/Extender: Abbie Sons in Treatment: [address] [place] J ([id number]) [id number] Page 5 of 8 Wound Status Wound Number: 14 Primary Etiology: Abrasion Wound Location: Left Calcaneus Wound Status: Open Wounding Event: Blister Date Acquired: 04/07/2023 Weeks Of Treatment: 4 Clustered Wound: No Wound Measurements Length: (cm) 1.2 Width: (cm) 1 Depth: (cm) 0.1 Area: (cm) 0.942 Volume: (cm) 0.094 %  Reduction in Area: -380.6% % Reduction in Volume: -370% Wound  Description Classification: Full Thickness Without Exposed Suppor Exudate Amount: Medium Exudate Type: Serosanguineous Exudate Color: red, brown t Structures Periwound Skin Texture Texture Color No Abnormalities Noted: No No Abnormalities Noted: No Moisture No Abnormalities Noted: No Treatment Notes Wound #14 (Calcaneus) Wound Laterality: Left Cleanser Soap and Water Discharge Instruction: May shower and wash wound with dial antibacterial soap and water prior to dressing change. Vashe 5.8 (oz) Discharge Instruction: Cleanse the wound with Vashe prior to applying a clean dressing using gauze sponges, not tissue or cotton balls. Peri-Wound Care Sween Lotion (Moisturizing lotion) Discharge Instruction: Apply moisturizing lotion as directed Topical Primary Dressing Hydrofera Blue Ready Transfer Foam, 4x5 (in/in) Discharge Instruction: Apply to wound bed as instructed Secondary Dressing ABD Pad, 8x10 Discharge Instruction: Apply over primary dressing as directed. Optifoam Non-Adhesive Dressing, 4x4 in Discharge Instruction: Apply to ankle, achilles, lateral part of foot for protection Secured With Compression Wrap Urgo K2, (equivalent to a 4 layer) two layer compression system, regular Discharge Instruction: Apply Urgo K2 as directed (alternative to 4 layer compression). Compression Stockings Add-Ons Electronic Signature(s) Signed: 05/12/2023 3:43:47 PM By: Thayer Dallas Entered By: Thayer Dallas on 05/12/2023 13:31:50 [name] ([id number]) [id number] Page 6 of 8 -------------------------------------------------------------------------------- Wound Assessment Details Patient Name: Date of Service: [name], [name] 05/12/2023 1:30 PM Medical Record Number: [id number] Patient Account Number: [id number] Date of Birth/Sex: Treating RN: September 02, 1955 (67 y.o. M) Primary Care  Davion Flannery: Hillard Danker Other Clinician: Referring Aishi Courts: Treating Veryl Abril/Extender: Abbie Sons in Treatment: 10 Wound Status Wound Number: 15 Primary Etiology: Abrasion Wound Location: Right, Anterior Lower Leg Secondary Etiology: Diabetic Wound/Ulcer of the Lower Extremity Wounding Event: Trauma Wound Status: Open Date Acquired: 02/25/2023 Weeks Of Treatment: 2 Clustered Wound: No Wound Measurements Length: (cm) 2 Width: (cm) 1.8 Depth: (cm) 0.1 Area: (cm) 2.827 Volume: (cm) 0.283 % Reduction in Area: -140% % Reduction in Volume: -19.9% Wound Description Classification: Full Thickness Without Exposed Suppor Exudate Amount: Medium Exudate Type: Serosanguineous Exudate Color: red, brown t Structures Periwound Skin Texture Texture Color No Abnormalities Noted: No No Abnormalities Noted: No Moisture No Abnormalities Noted: No Treatment Notes Wound #15 (Lower Leg) Wound Laterality: Right, Anterior Cleanser Soap and Water Discharge Instruction: May shower and wash wound with dial antibacterial soap and water prior to dressing change. Vashe 5.8 (oz) Discharge Instruction: Cleanse the wound with Vashe prior to applying a clean dressing using gauze sponges, not tissue or cotton balls. Peri-Wound Care Sween Lotion (Moisturizing lotion) Discharge Instruction: Apply moisturizing lotion as directed Topical Primary Dressing Hydrofera Blue Ready Transfer Foam, 4x5 (in/in) Discharge Instruction: Apply to wound bed as instructed Secondary Dressing ABD Pad, 8x10 Discharge Instruction: Apply over primary dressing as directed. Optifoam Non-Adhesive Dressing, 4x4 in Discharge Instruction: Apply to ankle, achilles, lateral part of foot for protection Secured With Compression Wrap Urgo K2, (equivalent to a 4 layer) two layer compression system, regular Discharge Instruction: Apply Urgo K2 as directed (alternative to 4 layer  compression). [name], [name] ([id number]) [id number] Page 7 of 8 Compression Stockings Add-Ons Electronic Signature(s) Signed: 05/12/2023 3:43:47 PM By: Thayer Dallas Entered By: Thayer Dallas on 05/12/2023 13:31:50 -------------------------------------------------------------------------------- Wound Assessment Details Patient Name: Date of Service: [name], [name] NDY J. 05/12/2023 1:30 PM Medical Record Number: [id number] Patient Account Number: [id number] Date of Birth/Sex: Treating RN: Jan 02, 1956 (67 y.o. M) Primary Care Arthur Speagle: Hillard Danker Other Clinician: Referring Alonie Gazzola: Treating Musa Rewerts/Extender: Abbie Sons in Treatment: 10 Wound Status Wound Number: 16 Primary Etiology: Venous Leg Ulcer Wound Location:  Left, Lateral Lower Leg Wound Status: Open Wounding Event: Gradually Appeared Date Acquired: 04/27/2023 Weeks Of Treatment: 2 Clustered Wound: No Wound Measurements Length: (cm) 0.5 Width: (cm) 0.5 Depth: (cm) 0.1 Area: (cm) 0.196 Volume: (cm) 0.02 % Reduction in Area: 61% % Reduction in Volume: 60% Wound Description Classification: Full Thickness Without Exposed Support S Exudate Amount: None Present tructures Periwound Skin Texture Texture Color No Abnormalities Noted: No No Abnormalities Noted: No Moisture No Abnormalities Noted: No Treatment Notes Wound #16 (Lower Leg) Wound Laterality: Left, Lateral Cleanser Soap and Water Discharge Instruction: May shower and wash wound with dial antibacterial soap and water prior to dressing change. Vashe 5.8 (oz) Discharge Instruction: Cleanse the wound with Vashe prior to applying a clean dressing using gauze sponges, not tissue or cotton balls. Peri-Wound Care Sween Lotion (Moisturizing lotion) Discharge Instruction: Apply moisturizing lotion as directed Topical Primary Dressing Hydrofera Blue Ready Transfer Foam, 4x5 (in/in) Discharge  Instruction: Apply to wound bed as instructed Secondary Dressing ABD Pad, 8x10 [name], [name] ([id number]) [id number] Page 8 of 8 Discharge Instruction: Apply over primary dressing as directed. Optifoam Non-Adhesive Dressing, 4x4 in Discharge Instruction: Apply to ankle, achilles, lateral part of foot for protection Secured With Compression Wrap Urgo K2, (equivalent to a 4 layer) two layer compression system, regular Discharge Instruction: Apply Urgo K2 as directed (alternative to 4 layer compression). Compression Stockings Add-Ons Electronic Signature(s) Signed: 05/12/2023 3:43:47 PM By: Thayer Dallas Entered By: Thayer Dallas on 05/12/2023 13:31:50 -------------------------------------------------------------------------------- Vitals Details Patient Name: Date of Service: [name], [name] NDY J. 05/12/2023 1:30 PM Medical Record Number: [id number] Patient Account Number: [id number] Date of Birth/Sex: Treating RN: 05-08-1956 (67 y.o. M) Primary Care Jakaylah Schlafer: Hillard Danker Other Clinician: Referring Shaelynn Dragos: Treating Ziya Coonrod/Extender: Abbie Sons in Treatment: 10 Vital Signs Time Taken: 13:31 Reference Range: 80 - 120 mg / dl Height (in): 73 Weight (lbs): 254 Body Mass Index (BMI): 33.5 Electronic Signature(s) Signed: 05/12/2023 3:43:47 PM By: Thayer Dallas Entered By: Thayer Dallas on 05/12/2023 13:31:27

## 2023-05-13 NOTE — Progress Notes (Signed)
ZADARIUS, MARN ([id number]) [id number] Page 1 of 1 Visit Report for 05/12/2023 SuperBill Details Patient Name: Date of Service: JAVAUN, DEETER 05/12/2023 Medical Record Number: [id number] Patient Account Number: [id number] Date of Birth/Sex: Treating RN: 03-12-1956 (67 y.o. M) Primary Care Provider: Hillard Danker Other Clinician: Referring Provider: Treating Provider/Extender: Abbie Sons in Treatment: 10 Diagnosis Coding ICD-10 Codes Code Description [phone] Non-pressure chronic ulcer of other part of left lower leg with other specified severity I87.332 Chronic venous hypertension (idiopathic) with ulcer and inflammation of left lower extremity L97.328 Non-pressure chronic ulcer of left ankle with other specified severity E11.622 Type 2 diabetes mellitus with other skin ulcer L97.818 Non-pressure chronic ulcer of other part of right lower leg with other specified severity Facility Procedures CPT4 Description Modifier Quantity Code [id number] [phone] BILATERAL: Application of multi-layer venous compression system; leg (below knee), including ankle and 1 foot. Electronic Signature(s) Signed: 05/12/2023 3:43:47 PM By: Thayer Dallas Signed: 05/12/2023 5:33:59 PM By: Duanne Guess MD FACS Entered By: Thayer Dallas on 05/12/2023 15:38:41

## 2023-05-19 ENCOUNTER — Encounter (HOSPITAL_BASED_OUTPATIENT_CLINIC_OR_DEPARTMENT_OTHER): Payer: Medicare HMO | Attending: Internal Medicine | Admitting: Internal Medicine

## 2023-05-19 DIAGNOSIS — L97828 Non-pressure chronic ulcer of other part of left lower leg with other specified severity: Secondary | ICD-10-CM | POA: Diagnosis not present

## 2023-05-19 DIAGNOSIS — L97818 Non-pressure chronic ulcer of other part of right lower leg with other specified severity: Secondary | ICD-10-CM | POA: Insufficient documentation

## 2023-05-19 DIAGNOSIS — I87332 Chronic venous hypertension (idiopathic) with ulcer and inflammation of left lower extremity: Secondary | ICD-10-CM | POA: Insufficient documentation

## 2023-05-19 DIAGNOSIS — Z8616 Personal history of COVID-19: Secondary | ICD-10-CM | POA: Diagnosis not present

## 2023-05-19 DIAGNOSIS — I428 Other cardiomyopathies: Secondary | ICD-10-CM | POA: Insufficient documentation

## 2023-05-19 DIAGNOSIS — I480 Paroxysmal atrial fibrillation: Secondary | ICD-10-CM | POA: Insufficient documentation

## 2023-05-19 DIAGNOSIS — I110 Hypertensive heart disease with heart failure: Secondary | ICD-10-CM | POA: Diagnosis not present

## 2023-05-19 DIAGNOSIS — S80811A Abrasion, right lower leg, initial encounter: Secondary | ICD-10-CM | POA: Diagnosis not present

## 2023-05-19 DIAGNOSIS — L97328 Non-pressure chronic ulcer of left ankle with other specified severity: Secondary | ICD-10-CM | POA: Insufficient documentation

## 2023-05-19 DIAGNOSIS — E11622 Type 2 diabetes mellitus with other skin ulcer: Secondary | ICD-10-CM | POA: Diagnosis present

## 2023-05-19 DIAGNOSIS — I5022 Chronic systolic (congestive) heart failure: Secondary | ICD-10-CM | POA: Insufficient documentation

## 2023-05-19 DIAGNOSIS — Z7901 Long term (current) use of anticoagulants: Secondary | ICD-10-CM | POA: Insufficient documentation

## 2023-05-19 DIAGNOSIS — L97222 Non-pressure chronic ulcer of left calf with fat layer exposed: Secondary | ICD-10-CM | POA: Diagnosis not present

## 2023-05-19 NOTE — Progress Notes (Addendum)
HYLAN, GATT ([id number]) [id number] Page 1 of 14 Visit Report for 05/19/2023 Arrival Information Details Patient Name: Date of Service: [name], [name] 05/19/2023 1:30 PM Medical Record Number: [id number] Patient Account Number: [id number] Date of Birth/Sex: Treating RN: 1956-05-14 (67 y.o. [name] Primary Care [name]: [name] Other Clinician: Referring [name]: Treating [name]/Extender: [name] in Treatment: 11 Visit Information History Since Last Visit Added or deleted any medications: No Patient Arrived: Ambulatory Any new allergies or adverse reactions: No Arrival Time: 15:47 Had a fall or experienced change in No Accompanied By: self activities of daily living that may affect Transfer Assistance: None risk of falls: Patient Identification Verified: Yes Signs or symptoms of abuse/neglect since last visito No Secondary Verification Process Completed: Yes Hospitalized since last visit: No Patient Requires Transmission-Based Precautions: No Implantable device outside of the clinic excluding No Patient Has Alerts: Yes cellular tissue based products placed in the center Patient Alerts: Patient on Blood Thinner since last visit: Has Dressing in Place as Prescribed: Yes Has Compression in Place as Prescribed: Yes Pain Present Now: Yes Electronic Signature(s) Signed: 05/19/2023 4:06:44 PM By: [name] Entered By: [name] on 05/19/2023 15:47:41 -------------------------------------------------------------------------------- Compression Therapy Details Patient Name: Date of Service: [name] Asper NDY [name]. 05/19/2023 1:30 PM Medical Record Number: [id number] Patient Account Number: [id number] Date of Birth/Sex: Treating RN: [birth year] (67 y.o. [name] Primary Care [name]: [name] Other Clinician: Referring Aunna Snooks: Treating [name]/Extender:  [name] in Treatment: 11 Compression Therapy Performed for Wound Assessment: Wound #13 Left,Posterior Lower Leg Performed By: Clinician [name] Bruin, RN Compression Type: Double Layer Post Procedure Diagnosis Same as Pre-procedure Electronic Signature(s) Signed: 05/19/2023 4:06:44 PM By: [name] Entered By: [name] on 05/19/2023 15:54:59 [name], [name] Seltzer ([id number]) [id number] Page 2 of 14 -------------------------------------------------------------------------------- Compression Therapy Details Patient Name: Date of Service: [name], [name] 05/19/2023 1:30 PM Medical Record Number: [id number] Patient Account Number: [id number] Date of Birth/Sex: Treating RN: Oct 16, 1955 (67 y.o. [name] Primary Care [name]: [name] Other Clinician: Referring [name]: Treating [name]/Extender: [name] in Treatment: 11 Compression Therapy Performed for Wound Assessment: Wound #15 Right,Anterior Lower Leg Performed By: Clinician [name] Bruin, RN Compression Type: Double Layer Post Procedure Diagnosis Same as Pre-procedure Electronic Signature(s) Signed: 05/19/2023 4:06:44 PM By: [name] By: [name] on 05/19/2023 15:54:59 -------------------------------------------------------------------------------- Encounter Discharge Information Details Patient Name: Date of Service: [name] Asper NDY [name]. 05/19/2023 1:30 PM Medical Record Number: [id number] Patient Account Number: [id number] Date of Birth/Sex: Treating RN: 1955-12-22 (67 y.o. [name]: [name] Other Clinician: Referring [name]: Treating [name]/Extender: [name] in Treatment: 11 Encounter Discharge Information Items Discharge Condition: Stable Ambulatory Status:  Ambulatory Discharge Destination: Home Transportation: Private Auto Accompanied By: self Schedule Follow-up Appointment: Yes Clinical Summary of Care: Patient Declined Electronic Signature(s) Signed: 05/19/2023 4:06:44 PM By: [name] Entered By: [name] on 05/19/2023 15:58:21 -------------------------------------------------------------------------------- Lower Extremity Assessment Details Patient Name: Date of Service: [name], [name] 05/19/2023 1:30 PM Medical Record Number: [id number] Patient Account Number: [id number] Date of Birth/Sex: Treating RN: Sep 16, 1955 (67 y.o. [name] Primary Care [name]: [name] Other Clinician: Referring [name]: Treating [name]/Extender: [name] in Treatment: 11 Edema Assessment Assessed: [name]: No] [name]: No] Edema: [Left: N] [Right: o] Calf [name], [name] ([id number]) [id number] Page 3 of 14 Left: Right: Point of Measurement: 33 cm From Medial Instep 36.5 cm  37.2 cm Ankle Left: Right: Point of Measurement: 12 cm From Medial Instep 24 cm 21.5 cm Vascular Assessment Pulses: Dorsalis Pedis Palpable: [Left:Yes] [Right:Yes] Extremity colors, hair growth, and conditions: Extremity Color: [Left:Hyperpigmented] [Right:Hyperpigmented] Hair Growth on Extremity: [Left:No] [Right:No] Temperature of Extremity: [Left:Warm] [Right:Warm] Capillary Refill: [Left:< 3 seconds] [Right:< 3 seconds] Dependent Rubor: [Left:No] [Right:No] Blanched when Elevated: [Left:Yes] [Right:No No] Electronic Signature(s) Signed: 05/19/2023 4:06:44 PM By: [name] Entered By: [name] on 05/19/2023 15:48:47 -------------------------------------------------------------------------------- Multi Wound Chart Details Patient Name: Date of Service: [name] Asper NDY [name]. 05/19/2023 1:30 PM Medical Record Number: [id number] Patient Account Number:  [id number] Date of Birth/Sex: Treating RN: July 22, 1955 (67 y.o. M) Primary Care [name]: [name] Other Clinician: Referring [name]: Treating [name]/Extender: [name] in Treatment: 11 Vital Signs Height(in): 73 Pulse(bpm): 70 Weight(lbs): 254 Blood Pressure(mmHg): 111/76 Body Mass Index(BMI): 33.5 Temperature(F): 98.2 Respiratory Rate(breaths/min): 18 [13:Photos: No Photos Left, Posterior Lower Leg Wound Location: Blister Wounding Event: Venous Leg Ulcer Primary Etiology: Diabetic Wound/Ulcer of the Lower Secondary Etiology: Extremity Asthma, Sleep Apnea, Arrhythmia, Comorbid History: Congestive Heart Failure,  Hypertension, Type II Diabetes 01/20/2023 Date Acquired: 11 Weeks of Treatment: Open Wound Status: No Wound Recurrence: 1.7x0.7x0.1 Measurements L x W x D (cm) 0.935 A (cm) : rea 0.093 Volume (cm) : 95.20% % Reduction in Area: 97.60% % Reduction in Volume:  Full Thickness Without Exposed Classification: Support Structures Small Exudate Amount: Serosanguineous Exudate Type: red, brown Exudate Color: Distinct, outline attached Wound Margin: Large (67-100%) Granulation Amount:] [14:No Photos Left Calcaneus  Blister Abrasion N/A Asthma, Sleep Apnea, Arrhythmia, Congestive Heart Failure, Hypertension, Type II Diabetes 04/07/2023 5 Open No 1.4x0.8x0.1 0.88 0.088 -349.00% -340.00% Full Thickness Without Exposed Support Structures Medium Serosanguineous red,  brown Distinct, outline attached Medium (34-66%)] [15:No Photos Right, Anterior Lower Leg Trauma Abrasion Diabetic Wound/Ulcer of the Lower Extremity Asthma, Sleep Apnea, Arrhythmia, Congestive Heart Failure, Hypertension, Type II Diabetes 02/25/2023 3  Open No 2x1.5x0.1 2.356 0.236 -100.00% 0.00% Full Thickness Without Exposed Support Structures Medium Serosanguineous red, brown Distinct, outline attached Large (67-100%)] MAURO, BOUCH ([id number]) [13:Red Granulation Quality: Small (1-33%)  Necrotic Amount: Eschar Necrotic Tissue: Fat Layer (Subcutaneous Tissue): Yes Fat Layer (Subcutaneous Tissue): Yes Fat Layer (Subcutaneous Tissue): Yes Exposed Structures: Fascia: No Tendon: No Muscle: No Joint:  No Bone: No Small (1-33%) Epithelialization: Scarring: Yes Periwound Skin Texture: No Abnormalities Noted Periwound Skin Moisture: Hemosiderin Staining: Yes Periwound Skin Color: No Abnormality Temperature: Compression Therapy Procedures Performed:]  [14:Red Medium (34-66%) Adherent Slough Fascia: No Tendon: No Muscle: No Joint: No Bone: No Small (1-33%) Scarring: Yes No Abnormalities Noted Hemosiderin Staining: Yes No Abnormality N/A] [phone] Page 4 of 14 Red None  Present (0%) N/A Fascia: No Tendon: No Muscle: No Joint: No Bone: No Small (1-33%) No Abnormalities Noted No Abnormalities Noted Hemosiderin Staining: Yes No Abnormality Compression Therapy] Wound Number: 16 N/A N/A Photos: No Photos N/A N/A Left, Lateral Lower Leg N/A N/A Wound Location: Gradually Appeared N/A N/A Wounding Event: Venous Leg Ulcer N/A N/A Primary Etiology: N/A N/A N/A Secondary Etiology: Asthma, Sleep Apnea, Arrhythmia, N/A N/A Comorbid History: Congestive Heart Failure, Hypertension, Type II Diabetes 04/27/2023 N/A N/A Date Acquired: 3 N/A N/A Weeks of Treatment: Open N/A N/A Wound Status: No N/A N/A Wound Recurrence: 0.3x0.3x0.1 N/A N/A Measurements L x W x D (cm) 0.071 N/A N/A A (cm) : rea 0.007 N/A N/A Volume (cm) : 85.90% N/A N/A % Reduction in Area: 86.00% N/A N/A % Reduction in Volume: Full Thickness  Without Exposed N/A N/A Classification: Support Structures None Present N/A N/A Exudate A mount: N/A N/A N/A Exudate Type: N/A N/A N/A Exudate Color: Distinct, outline attached N/A N/A Wound Margin: None Present (0%) N/A N/A Granulation Amount: N/A N/A N/A Granulation Quality: Large (67-100%) N/A N/A Necrotic Amount: Eschar N/A  N/A Necrotic Tissue: Fat Layer (Subcutaneous Tissue): Yes N/A N/A Exposed Structures: Fascia: No Tendon: No Muscle: No Joint: No Bone: No Large (67-100%) N/A N/A Epithelialization: No Abnormalities Noted N/A N/A Periwound Skin Texture: No Abnormalities Noted N/A N/A Periwound Skin Moisture: Hemosiderin Staining: Yes N/A N/A Periwound Skin Color: No Abnormality N/A N/A Temperature: N/A N/A N/A Procedures Performed: Treatment Notes Wound #13 (Lower Leg) Wound Laterality: Left, Posterior Cleanser Soap and Water Discharge Instruction: May shower and wash wound with dial antibacterial soap and water prior to dressing change. Vashe 5.8 (oz) Discharge Instruction: Cleanse the wound with Vashe prior to applying a clean dressing using gauze sponges, not tissue or cotton balls. Peri-Wound Care Sween Lotion (Moisturizing lotion) Discharge Instruction: Apply moisturizing lotion as directed Topical Primary Dressing Hydrofera Blue Ready Transfer Foam, 4x5 (in/in) Discharge Instruction: Apply to wound bed as instructed Secondary Dressing ABD Pad, 8x10 Discharge Instruction: Apply over primary dressing as directed. Optifoam Non-Adhesive Dressing, 4x4 in [place], [name] ([id number]) [id number] Page 5 of 14 Discharge Instruction: Apply to ankle, achilles, lateral part of foot for protection Secured With Compression Wrap Urgo K2, (equivalent to a 4 layer) two layer compression system, regular Discharge Instruction: Apply Urgo K2 as directed (alternative to 4 layer compression). Compression Stockings Add-Ons Wound #14 (Calcaneus) Wound Laterality: Left Cleanser Soap and Water Discharge Instruction: May shower and wash wound with dial antibacterial soap and water prior to dressing change. Vashe 5.8 (oz) Discharge Instruction: Cleanse the wound with Vashe prior to applying a clean dressing using gauze sponges, not tissue or cotton balls. Peri-Wound  Care Sween Lotion (Moisturizing lotion) Discharge Instruction: Apply moisturizing lotion as directed Topical Primary Dressing Hydrofera Blue Ready Transfer Foam, 4x5 (in/in) Discharge Instruction: Apply to wound bed as instructed Secondary Dressing ABD Pad, 8x10 Discharge Instruction: Apply over primary dressing as directed. Optifoam Non-Adhesive Dressing, 4x4 in Discharge Instruction: Apply to ankle, achilles, lateral part of foot for protection Secured With Compression Wrap Urgo K2, (equivalent to a 4 layer) two layer compression system, regular Discharge Instruction: Apply Urgo K2 as directed (alternative to 4 layer compression). Compression Stockings Add-Ons Wound #15 (Lower Leg) Wound Laterality: Right, Anterior Cleanser Soap and Water Discharge Instruction: May shower and wash wound with dial antibacterial soap and water prior to dressing change. Vashe 5.8 (oz) Discharge Instruction: Cleanse the wound with Vashe prior to applying a clean dressing using gauze sponges, not tissue or cotton balls. Peri-Wound Care Sween Lotion (Moisturizing lotion) Discharge Instruction: Apply moisturizing lotion as directed Topical Primary Dressing Hydrofera Blue Ready Transfer Foam, 4x5 (in/in) Discharge Instruction: Apply to wound bed as instructed Secondary Dressing ABD Pad, 8x10 Discharge Instruction: Apply over primary dressing as directed. Optifoam Non-Adhesive Dressing, 4x4 in Discharge Instruction: Apply to ankle, achilles, lateral part of foot for protection Secured With Compression Wrap Urgo K2, (equivalent to a 4 layer) two layer compression system, regular [name], [name] ([id number]) [id number] Page 6 of 14 Discharge Instruction: Apply Urgo K2 as directed (alternative to 4 layer compression). Compression Stockings Add-Ons Wound #16 (Lower Leg) Wound Laterality: Left, Lateral Cleanser Soap and Water Discharge Instruction: May shower and wash  wound with dial antibacterial soap and water prior to dressing change. Vashe 5.8 (oz)  Discharge Instruction: Cleanse the wound with Vashe prior to applying a clean dressing using gauze sponges, not tissue or cotton balls. Peri-Wound Care Sween Lotion (Moisturizing lotion) Discharge Instruction: Apply moisturizing lotion as directed Topical Primary Dressing Hydrofera Blue Ready Transfer Foam, 4x5 (in/in) Discharge Instruction: Apply to wound bed as instructed Secondary Dressing ABD Pad, 8x10 Discharge Instruction: Apply over primary dressing as directed. Optifoam Non-Adhesive Dressing, 4x4 in Discharge Instruction: Apply to ankle, achilles, lateral part of foot for protection Secured With Compression Wrap Urgo K2, (equivalent to a 4 layer) two layer compression system, regular Discharge Instruction: Apply Urgo K2 as directed (alternative to 4 layer compression). Compression Stockings Add-Ons Electronic Signature(s) Signed: 05/24/2023 9:47:40 AM By: Baltazar Najjar MD Entered By: Baltazar Najjar on 05/23/2023 19:42:56 -------------------------------------------------------------------------------- Multi-Disciplinary Care Plan Details Patient Name: Date of Service: [name] Asper NDY [name]. 05/19/2023 1:30 PM Medical Record Number: [id number] Patient Account Number: [id number] Date of Birth/Sex: Treating RN: 1955-12-14 (67 y.o. [name] Primary Care Odell Fasching: [name] Other Clinician: Referring Anamari Galeas: Treating Athalie Newhard/Extender: [name] in Treatment: 11 Active Inactive Wound/Skin Impairment Nursing Diagnoses: Impaired tissue integrity Knowledge deficit related to ulceration/compromised skin integrity Goals: Patient/caregiver will verbalize understanding of skin care regimen LYONEL, BLANE ([id number]) [id number] Page 7 of 14 Date Initiated: 03/03/2023 Target Resolution Date: 06/12/2023 Goal Status:  Active Ulcer/skin breakdown will have a volume reduction of 30% by week 4 Date Initiated: 03/03/2023 Date Inactivated: 05/05/2023 Target Resolution Date: 04/18/2023 Unmet Reason: see wound Goal Status: Unmet measurements. Interventions: Assess patient/caregiver ability to obtain necessary supplies Assess patient/caregiver ability to perform ulcer/skin care regimen upon admission and as needed Assess ulceration(s) every visit Provide education on ulcer and skin care Notes: Electronic Signature(s) Signed: 05/19/2023 4:06:44 PM By: [name] Entered By: [name] on 05/19/2023 15:57:10 -------------------------------------------------------------------------------- Pain Assessment Details Patient Name: Date of Service: JERAME, DUERKSEN 05/19/2023 1:30 PM Medical Record Number: [id number] Patient Account Number: [id number] Date of Birth/Sex: Treating RN: 31-Dec-1955 (67 y.o. [name] Primary Care Breelle [place]: [name] Other Clinician: Referring Rook Maue: Treating Brandon Wiechman/Extender: [name] in Treatment: 11 Active Problems Location of Pain Severity and Description of Pain Patient Has Paino Yes Site Locations Pain Location: Pain in Ulcers Duration of the Pain. Constant / Intermittento Constant Rate the pain. Current Pain Level: 6 Character of Pain Describe the Pain: Burning Pain Management and Medication Current Pain Management: Medication: Yes How does your wound impact your activities of daily livingo Sleep: Yes Electronic Signature(s) Signed: 05/19/2023 4:06:44 PM By: [name] Entered By: [name] on 05/19/2023 15:48:14 [name] ([id number]) [id number] Page 8 of 14 -------------------------------------------------------------------------------- Patient/Caregiver Education Details Patient Name: Date of Service: [name], [name]  12/3/2024andnbsp1:30 PM Medical Record Number: [id number] Patient Account Number: [id number] Date of Birth/Gender: Treating RN: 03-30-1956 (67 y.o. [name] Primary Care Physician: [name] Other Clinician: Referring Physician: Treating Physician/Extender: [name] in Treatment: 11 Education Assessment Education Provided To: Patient Education Topics Provided Wound/Skin Impairment: Methods: Explain/Verbal Responses: Reinforcements needed, State content correctly Electronic Signature(s) Signed: 05/19/2023 4:06:44 PM By: [name] Entered By: [name] on 05/19/2023 15:57:26 -------------------------------------------------------------------------------- Wound Assessment Details Patient Name: Date of Service: ZYIEN, DIEKEN NDY [name]. 05/19/2023 1:30 PM Medical Record Number: [id number] Patient Account Number: [id number] Date of Birth/Sex: Treating RN: 03/15/1956 (67 y.o. [name] Primary Care Charli Liberatore: [name] Other Clinician: Referring Makye Radle: Treating Finnley Lewis/Extender: [name] in Treatment: 11 Wound Status Wound Number: 13  Primary Venous Leg Ulcer Etiology: Wound Location: Left, Posterior Lower Leg Secondary Diabetic Wound/Ulcer of the Lower Extremity Wounding Event: Blister Etiology: Date Acquired: 01/20/2023 Wound Open Weeks Of Treatment: 11 Status: Clustered Wound: No Comorbid Asthma, Sleep Apnea, Arrhythmia, Congestive Heart Failure, History: Hypertension, Type II Diabetes Wound Measurements Length: (cm) 1.7 Width: (cm) 0.7 Depth: (cm) 0.1 Area: (cm) 0.935 Volume: (cm) 0.093 % Reduction in Area: 95.2% % Reduction in Volume: 97.6% Epithelialization: Small (1-33%) Tunneling: No Undermining: No Wound Description Classification: Full Thickness Without Exposed Support Structures Wound Margin: Distinct, outline attached Exudate Amount:  Small Exudate Type: Serosanguineous Exudate Color: red, brown [name], [name] ([id number]) Wound Bed Granulation Amount: Large (67-100%) Granulation Quality: Red Necrotic Amount: Small (1-33%) Necrotic Quality: Eschar Foul Odor After Cleansing: No Slough/Fibrino No [id number] Page 9 of 14 Exposed Structure Fascia Exposed: No Fat Layer (Subcutaneous Tissue) Exposed: Yes Tendon Exposed: No Muscle Exposed: No Joint Exposed: No Bone Exposed: No Periwound Skin Texture Texture Color No Abnormalities Noted: No No Abnormalities Noted: No Scarring: Yes Hemosiderin Staining: Yes Moisture Temperature / Pain No Abnormalities Noted: Yes Temperature: No Abnormality Treatment Notes Wound #13 (Lower Leg) Wound Laterality: Left, Posterior Cleanser Soap and Water Discharge Instruction: May shower and wash wound with dial antibacterial soap and water prior to dressing change. Vashe 5.8 (oz) Discharge Instruction: Cleanse the wound with Vashe prior to applying a clean dressing using gauze sponges, not tissue or cotton balls. Peri-Wound Care Sween Lotion (Moisturizing lotion) Discharge Instruction: Apply moisturizing lotion as directed Topical Primary Dressing Hydrofera Blue Ready Transfer Foam, 4x5 (in/in) Discharge Instruction: Apply to wound bed as instructed Secondary Dressing ABD Pad, 8x10 Discharge Instruction: Apply over primary dressing as directed. Optifoam Non-Adhesive Dressing, 4x4 in Discharge Instruction: Apply to ankle, achilles, lateral part of foot for protection Secured With Compression Wrap Urgo K2, (equivalent to a 4 layer) two layer compression system, regular Discharge Instruction: Apply Urgo K2 as directed (alternative to 4 layer compression). Compression Stockings Add-Ons Electronic Signature(s) Signed: 05/19/2023 4:06:44 PM By: [name] Entered By: [name] on 05/19/2023  15:51:23 -------------------------------------------------------------------------------- Wound Assessment Details Patient Name: Date of Service: [name], [name]. 05/19/2023 1:30 PM Medical Record Number: [id number] Patient Account Number: [id number] Date of Birth/Sex: Treating RN: [birth year] (67 y.o. [name] Primary Care Erlean Mealor: [name] Other Clinician: Referring Montrell Cessna: Treating Tiernan Millikin/Extender: [name] in Treatment: [address] ELISHUA, KLEMPNER ([id number]) [id number] Page 10 of 14 Wound Number: 14 Primary Abrasion Etiology: Wound Location: Left Calcaneus Wound Open Wounding Event: Blister Status: Date Acquired: 04/07/2023 Comorbid Asthma, Sleep Apnea, Arrhythmia, Congestive Heart Failure, Weeks Of Treatment: 5 History: Hypertension, Type II Diabetes Clustered Wound: No Wound Measurements Length: (cm) 1.4 Width: (cm) 0.8 Depth: (cm) 0.1 Area: (cm) 0.88 Volume: (cm) 0.088 % Reduction in Area: -349% % Reduction in Volume: -340% Epithelialization: Small (1-33%) Tunneling: No Undermining: No Wound Description Classification: Full Thickness Without Exposed Support Structures Wound Margin: Distinct, outline attached Exudate Amount: Medium Exudate Type: Serosanguineous Exudate Color: red, brown Foul Odor After Cleansing: No Slough/Fibrino Yes Wound Bed Granulation Amount: Medium (34-66%) Exposed Structure Granulation Quality: Red Fascia Exposed: No Necrotic Amount: Medium (34-66%) Fat Layer (Subcutaneous Tissue) Exposed: Yes Necrotic Quality: Adherent Slough Tendon Exposed: No Muscle Exposed: No Joint Exposed: No Bone Exposed: No Periwound Skin Texture Texture Color No Abnormalities Noted: No No Abnormalities Noted: No Scarring: Yes Hemosiderin Staining: Yes Moisture Temperature / Pain No Abnormalities Noted: Yes Temperature: No Abnormality Treatment Notes Wound  #14 (Calcaneus) Wound  Laterality: Left Cleanser Soap and Water Discharge Instruction: May shower and wash wound with dial antibacterial soap and water prior to dressing change. Vashe 5.8 (oz) Discharge Instruction: Cleanse the wound with Vashe prior to applying a clean dressing using gauze sponges, not tissue or cotton balls. Peri-Wound Care Sween Lotion (Moisturizing lotion) Discharge Instruction: Apply moisturizing lotion as directed Topical Primary Dressing Hydrofera Blue Ready Transfer Foam, 4x5 (in/in) Discharge Instruction: Apply to wound bed as instructed Secondary Dressing ABD Pad, 8x10 Discharge Instruction: Apply over primary dressing as directed. Optifoam Non-Adhesive Dressing, 4x4 in Discharge Instruction: Apply to ankle, achilles, lateral part of foot for protection Secured With Compression Wrap Urgo K2, (equivalent to a 4 layer) two layer compression system, regular Discharge Instruction: Apply Urgo K2 as directed (alternative to 4 layer compression). Compression Stockings Add-Ons [name], [name] ([id number]) [phone].pdf Page 11 of 14 Electronic Signature(s) Signed: 05/19/2023 4:06:44 PM By: [name] Entered By: [name] on 05/19/2023 15:52:12 -------------------------------------------------------------------------------- Wound Assessment Details Patient Name: Date of Service: WERNER, CASINI 05/19/2023 1:30 PM Medical Record Number: [id number] Patient Account Number: [id number] Date of Birth/Sex: Treating RN: Apr 29, 1956 (67 y.o. [name] Primary Care Marquitta Persichetti: [name] Other Clinician: Referring Rahman Ferrall: Treating Jaydeen Odor/Extender: [name] in Treatment: 11 Wound Status Wound Number: 15 Primary Abrasion Etiology: Wound Location: Right, Anterior Lower Leg Secondary Diabetic Wound/Ulcer of the Lower Extremity Wounding Event: Trauma Etiology: Date  Acquired: 02/25/2023 Wound Open Weeks Of Treatment: 3 Status: Clustered Wound: No Comorbid Asthma, Sleep Apnea, Arrhythmia, Congestive Heart Failure, History: Hypertension, Type II Diabetes Wound Measurements Length: (cm) 2 Width: (cm) 1.5 Depth: (cm) 0.1 Area: (cm) 2.356 Volume: (cm) 0.236 % Reduction in Area: -100% % Reduction in Volume: 0% Epithelialization: Small (1-33%) Tunneling: No Undermining: No Wound Description Classification: Full Thickness Without Exposed Support Structures Wound Margin: Distinct, outline attached Exudate Amount: Medium Exudate Type: Serosanguineous Exudate Color: red, brown Foul Odor After Cleansing: No Slough/Fibrino No Wound Bed Granulation Amount: Large (67-100%) Exposed Structure Granulation Quality: Red Fascia Exposed: No Necrotic Amount: None Present (0%) Fat Layer (Subcutaneous Tissue) Exposed: Yes Tendon Exposed: No Muscle Exposed: No Joint Exposed: No Bone Exposed: No Periwound Skin Texture Texture Color No Abnormalities Noted: Yes No Abnormalities Noted: No Hemosiderin Staining: Yes Moisture No Abnormalities Noted: Yes Temperature / Pain Temperature: No Abnormality Treatment Notes Wound #15 (Lower Leg) Wound Laterality: Right, Anterior Cleanser Soap and Water Discharge Instruction: May shower and wash wound with dial antibacterial soap and water prior to dressing change. Vashe 5.8 (oz) Discharge Instruction: Cleanse the wound with Vashe prior to applying a clean dressing using gauze sponges, not tissue or cotton balls. Peri-Wound Care Sween Lotion (Moisturizing lotion) [name], [name] ([id number]) [id number] Page 12 of 14 Discharge Instruction: Apply moisturizing lotion as directed Topical Primary Dressing Hydrofera Blue Ready Transfer Foam, 4x5 (in/in) Discharge Instruction: Apply to wound bed as instructed Secondary Dressing ABD Pad, 8x10 Discharge Instruction: Apply over primary dressing  as directed. Optifoam Non-Adhesive Dressing, 4x4 in Discharge Instruction: Apply to ankle, achilles, lateral part of foot for protection Secured With Compression Wrap Urgo K2, (equivalent to a 4 layer) two layer compression system, regular Discharge Instruction: Apply Urgo K2 as directed (alternative to 4 layer compression). Compression Stockings Add-Ons Electronic Signature(s) Signed: 05/19/2023 4:06:44 PM By: [name] Entered By: [name] on 05/19/2023 15:52:45 -------------------------------------------------------------------------------- Wound Assessment Details Patient Name: Date of Service: [name], [name]. 05/19/2023 1:30 PM Medical Record Number: [id number] Patient Account Number: [id number] Date of Birth/Sex: Treating  RN: 10-11-1955 (67 y.o. [name] Primary Care Dajana Gehrig: [name] Other Clinician: Referring Timberlynn Kizziah: Treating Khianna Blazina/Extender: [name] in Treatment: 11 Wound Status Wound Number: 16 Primary Venous Leg Ulcer Etiology: Wound Location: Left, Lateral Lower Leg Wound Open Wounding Event: Gradually Appeared Status: Date Acquired: 04/27/2023 Comorbid Asthma, Sleep Apnea, Arrhythmia, Congestive Heart Failure, Weeks Of Treatment: 3 History: Hypertension, Type II Diabetes Clustered Wound: No Wound Measurements Length: (cm) 0.3 Width: (cm) 0.3 Depth: (cm) 0.1 Area: (cm) 0.071 Volume: (cm) 0.007 % Reduction in Area: 85.9% % Reduction in Volume: 86% Epithelialization: Large (67-100%) Tunneling: No Undermining: No Wound Description Classification: Full Thickness Without Exposed Support Structures Wound Margin: Distinct, outline attached Exudate Amount: None Present Foul Odor After Cleansing: No Slough/Fibrino No Wound Bed Granulation Amount: None Present (0%) Exposed Structure Necrotic Amount: Large (67-100%) Fascia Exposed: No Necrotic Quality: Eschar Fat Layer  (Subcutaneous Tissue) Exposed: Yes Tendon Exposed: No Muscle Exposed: No Joint Exposed: No Bone Exposed: No RESHAUN, BAERGA ([id number]) [id number] Page 13 of 14 Periwound Skin Texture Texture Color No Abnormalities Noted: Yes No Abnormalities Noted: No Hemosiderin Staining: Yes Moisture No Abnormalities Noted: Yes Temperature / Pain Temperature: No Abnormality Treatment Notes Wound #16 (Lower Leg) Wound Laterality: Left, Lateral Cleanser Soap and Water Discharge Instruction: May shower and wash wound with dial antibacterial soap and water prior to dressing change. Vashe 5.8 (oz) Discharge Instruction: Cleanse the wound with Vashe prior to applying a clean dressing using gauze sponges, not tissue or cotton balls. Peri-Wound Care Sween Lotion (Moisturizing lotion) Discharge Instruction: Apply moisturizing lotion as directed Topical Primary Dressing Hydrofera Blue Ready Transfer Foam, 4x5 (in/in) Discharge Instruction: Apply to wound bed as instructed Secondary Dressing ABD Pad, 8x10 Discharge Instruction: Apply over primary dressing as directed. Optifoam Non-Adhesive Dressing, 4x4 in Discharge Instruction: Apply to ankle, achilles, lateral part of foot for protection Secured With Compression Wrap Urgo K2, (equivalent to a 4 layer) two layer compression system, regular Discharge Instruction: Apply Urgo K2 as directed (alternative to 4 layer compression). Compression Stockings Add-Ons Electronic Signature(s) Signed: 05/19/2023 4:06:44 PM By: [name] Entered By: [name] on 05/19/2023 15:54:35 -------------------------------------------------------------------------------- Vitals Details Patient Name: Date of Service: [name], RA NDY [name]. 05/19/2023 1:30 PM Medical Record Number: [id number] Patient Account Number: [id number] Date of Birth/Sex: Treating RN: Apr 08, 1956 (67 y.o. [name] Primary Care Cymone Yeske: [name] Other Clinician: Referring Wellington Winegarden: Treating Ryelle Ruvalcaba/Extender: [name] in Treatment: 11 Vital Signs Time Taken: 13:15 Temperature (F): 98.2 Height (in): 73 Pulse (bpm): 70 Weight (lbs): 254 Respiratory Rate (breaths/min): 18 Body Mass Index (BMI): 33.5 Blood Pressure (mmHg): 111/76 Reference Range: 80 - 120 mg / dl Electronic Signature(s) FINNAN, WONG ([id number]) [id number] Page 14 of 14 Signed: 05/19/2023 4:06:44 PM By: [name] Entered By: [name] on 05/19/2023 15:47:56

## 2023-05-20 ENCOUNTER — Ambulatory Visit: Payer: Medicare HMO | Attending: Cardiovascular Disease

## 2023-05-20 DIAGNOSIS — I4891 Unspecified atrial fibrillation: Secondary | ICD-10-CM | POA: Diagnosis not present

## 2023-05-20 DIAGNOSIS — I513 Intracardiac thrombosis, not elsewhere classified: Secondary | ICD-10-CM | POA: Diagnosis not present

## 2023-05-20 LAB — POCT INR: INR: 3.8 — AB (ref 2.0–3.0)

## 2023-05-20 NOTE — Patient Instructions (Signed)
Description   Do not take any warfarin today then START taking Warfarin 1 tablet daily except for 1.5 tablets on Tuesday, and Thursday.  Recheck INR in 3 weeks. [facility] [phone].

## 2023-05-24 NOTE — Progress Notes (Signed)
[name], [name] ([id number]) [id number] Page 1 of 11 Visit Report for 05/19/2023 HPI Details Patient Name: Date of Service: [name], [name] 05/19/2023 1:30 PM Medical Record Number: [id number] Patient Account Number: [id number] Date of Birth/Sex: Treating RN: Jun 20, 1955 (67 y.o. M) Primary Care Provider: Hillard Danker Other Clinician: Referring Provider: Treating Provider/Extender: Shara Blazing in Treatment: 11 History of Present Illness HPI Description: ADMISSION 07/29/2018 [name] is a 67 year old man with either prediabetes or diabetes he is on glipizide. In late October to November 2019 he noted a scabbed area on the back of his right calf. He picked this off a few times but it would not heal. He saw his primary physician on 12/5. It was felt at that time that this may actually heal on its own with conservative management. The next visit was on 07/20/2018 noted the area was a lot worse and arranged for his treatment here. He has been topical antibiotics like Neosporin although he stopped using this when the wound looked worse. He is just been covering this with a clean Band-Aid. He is given Bactrim a week ago and he is finishing this currently. It is made some improvement in the surrounding erythema per the patient. He does not have a history of nonhealing wounds. No prior history of wounds on his legs that he had difficulty healing. No prior skin issues. 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 Past medical history; A. fib status post ablation and recent cardioversion he is on Xarelto. He has a history of systolic heart failure, cervical radiculopathy, cardiomyopathy and type 2 diabetes as discussed ABI in the right leg was noncompressible on the right 2/20; the biopsy I did on the patient last week was negative for malignancy. Culture grew Pseudomonas and methicillin sensitive staph aureus. He is  on cefdinir 300 twice a day. I will have to make this a 10 day course. He put him in compression. He states that the redness pain and erythema are a lot better. I suspect the patient has chronic venous insufficiency probably with a secondary cellulitis 2/27; arrives today with copious amounts of drainage coming out of the wound irritating the skin below the wound area. He only renewed the final 3 days of cefdinir today 3/5; came in for a nurse change 3 days ago. Again a lot of drainage noted. Zinc oxide was applied unfortunately the drainage appears to a pool then he has a string of superficial open areas extending down into the Achilles area and some just below the wound. The wound itself does not look too bad. He is completed the antibiotics although apparently there was separation from the original 7 with the last 3 days. Culture grew a few MSSA and a few Pseudomonas 3/12; not too much difference over the last week. He came in for a dressing change on Monday by our nursing staff. Necrotic debris again over the wound surface. The entire area looks irritated but nontender and I do not think shows obvious evidence of infection. We have not heard anything about the reflux studies. Also notable than in this diabetic man we had noncompressible vessels and although I can feel his pulses easily in his feet I will order arterial studies as well. 3/19-Patient had experience more pain has been dressed with a silver alginate, the wounds all have necrotic debris, very friable with easy bleeding with any kind of surface debridement including with gauze and Anasept and with a #3 curette. His vascular  appointments unfortunately were all canceled on account of the virus outbreak resulting in studies only being done for emergent cases. His pulses are easily palpable in the lower extremity. He has open areas below the primary wound where he states the drainage which included purulent material also with some blood  pooled and caused breakdown. 3/26; the patient was changed to Santyl with [facility] backing last week. This was largely because the silver alginate was sticking to the wound. He is still having quite a bit of pain. He tells [place] that the reflux studies and arterial studies we had attempted to arrange through vein and vascular are not being booked until mid May. Culture that was done last week showed both Serratia moderate and a few methicillin sensitive staph aureus I started him on Bactrim DS 1 p.o. twice daily on 3/23 for 7 days. He is here for follow-up. 4/3; patient is on Santyl with Hydrofera Blue. Patient complains of pain. Our intake nurse is noted drainage and some odor. He has completed Bactrim recently for Serratia and a few methicillin sensitive staph aureus. After our staff push hard last week we were able to get venous studies as well as arterial studies. His arterial studies showed on the right great toe pressure of 0.86. On the left ABI at 1.47 TBI of 0.87. On both sides waveforms were triphasic VENOUS STUDIES showed reflux in the femoral vein, popliteal vein great saphenous vein at the saphenofemoral junction and great saphenous vein at the proximal thigh. Also noted to have age-indeterminate superficial vein thrombosis involving the small saphenous vein. Notable that the patient is already on Xarelto for atrial fibrillation. 4/9; the patient has been to see vascular surgery and reviewed by Dr. Durwin Nora. He was felt to have only minimal superficial venous reflux in the right great greater saphenous vein. For this reason he was not felt to be a candidate for laser ablation of the right greater saphenous vein. A wedge cushion was suggested to keep his leg elevated. He does have deep venous reflux on the right. Culture I did last time showed a combination of Serratia Pseudomonas and methicillin sensitive staph aureus. We have been using Hydrofera Blue to the large wound, Santyl to some of  the deep punched out satellite lesions distal to it. There is some improvement 4/16; the application for Apligraf was put out for further review for material that we have resubmitted. He has the deep area on the right posterior calf which is large and then 3 small punched out areas beneath this. In general his wounds look a lot better 4/23; the patient has his large original wound and then 2 punched out satellite lesions below it on the right posterior calf. I was usually able to use Apligraf #1 to cover the full surface area of the larger wound. We continued with Santyl and Hydrofera Blue on the 2 punched-out areas 5/7; patient has his large original wound and 2 punched out satellite lesions below it in the right posterior calf. Apligraf #2 today. Major improvement in the big wound in terms of wound depth. Punched-out [place] wounds have a very healthy looking wound bed. 5/21-Patient comes back for his large right posterior calf wound for which she has been an Apligraf #3 today. Wound depth seems to be better, the punched-out wounds appear to have healthy bed with some bleeding 6/4; right posterior calf wound is much better. Apligraf #4 today. The major wound has no wound depth at all. Only 1 of the satellite  lesions is still open. The patient has very high blood pressure coming in today with a diastolic blood pressure of 130 after lying there for a few minutes it was down to 110. He states that is running 100 210 diastolic at home. He was high last week as well I thought he was on 50 mg 1/2 tablet twice daily of losartan I told him to double up on that however it turns out he is actually on 50 twice daily. Course he is run out of his medications prematurely. He has a follow-up with his primary's office next Wednesday. [name], [name] ([id number]) [id number] Page 2 of 11 6/18; patient's wounds continue to improve. The small area laterally is just about closed and there is  continued epithelialization on the area on the posterior calf. 7/2; we applied his last Apligraf last visit. Early the next week there was a lot of purulent looking drainage that I cultured this grew staph and Pseudomonas however when we had him back for the next visit to 3 days later everything looked a lot better. He did not receive systemic antibiotics. Fortunately we did not have to remove the Apligraf. He has had heart trouble this week he was having an ablation apparently they have discovered a clot in his left atrium they have changed all his anticoagulants as well as his blood pressure medication. 7/16; using Hydrofera Blue. We are making nice progress towards closure. He has not yet ordered his compression stockings he has his measurements 7/23; dimensions are better. He has a new satellite area superiorly. Both wounds cauterized with silver nitrate. 7/30; absolutely no change in the major wound on the posterior part of the right calf. We have been using Hydrofera Blue for a prolonged period of time and really had a nice improvement but over the last few weeks this is stalled. There is no depth. He arrived in clinic today with a new area on the right anterior tibia which apparently was an "ingrown hair". It is clearly an open area. This looks like a wrap injury although he was not really aware of it. 8/31; since the patient was last in clinic he was admitted to hospital from 8/17 through 8/22. He had had multiple falls at home including one off a ladder. He was admitted to hospital with mild COVID-19 viral pneumonia. Noted to be hyperkalemic and in acute renal failure. He has chronic systolic heart failure with ejection fraction of 35%. He arrives back in clinic with the original wound on the posterior aspect of the right lower calf looking a lot better. He has eschared areas from falls and lacerations on his anterior knees bilaterally just below the patella and on the mid part of his tibia  bilaterally. He seems to have a small area on the right lateral ankle as well 9/10- Patient comes to clinic with a 2 new wounds one on the left anterior shin and one on the right posterior leg both the left knee and right knee areas are healed up. We have been using Santyl to the left anterior leg and silver alginate to right posterior leg wounds. 9/17; the patient is original wound looks good on the posterior right calf. He has traumatic areas on the left anterior shin left anterior patellar tendon and the right mid tibia area. Except for the right posterior calf all required debridement. We have been using Santyl on these areas and silver alginate posteriorly right calf 9/24; the original wound on the posterior calf on the  right looks good. This is healthy. There are traumatic wounds in the left anterior shin, left anterior patella in the right mid tibia area are about the same. He has dusky erythema on the left anterior tibia which led me to give him antibiotics last week. This does not look too much different. This is nontender and I wonder if this is all just venous inflammation. 10/2; the patient has 2 open areas on the right posterior calf both of these look good we have been using silver alginate. He has traumatic wounds on the left and right mid tibia areas. The surface of these wounds is still not ready for closure. We have been using Iodoflex. Finally has areas on the left knee. The latter wounds are all traumatic after a fall 10/9. His original wounds of the right posterior calf are superficial and progressing towards closure. His traumatic wounds on the left anterior tibia and right anterior tibia are considerably improved in terms of the wound surface. He asked me about a skin graft in these areas I do not think this is going to be necessary. Change from Iodosorb to [facility]. The area on the left knee is just about closed as well 10/16; his original wound on the right posterior  calf very superficial looks healthy. He has more recent traumatic wounds on the left anterior tibia and right anterior tibia both of these have better looking surfaces and measuring smaller. Might be beneficial for an advanced treatment product. The area on his left knee has a small superficial scab and we are going to close that when 10/23; his original wound areas on the right posterior calf continue to improve. The more recently traumatic wounds on the left anterior tibia and right anterior tibia both look better in terms of surfaces. No debridement required. We have been using Hydrofera Blue. He is approved for Apligraf 11/3; Apligraf #1 applied to the left anterior tibia right anterior tibia and to 2 small areas remaining on the right posterior calf which were part of his initial wound area. 11/19; Apligraf #2. Left anterior tibia is healed. Right anterior tibia much better. Only one small area remains on the right posterior calf which was part of his initial wound area 12/3; the left anterior tibia has opened up again. Hyper granulated nodule. Right anterior tibia is superficial and larger. There is no depth of this. I did not place Apligraf today return to [facility] under compression 12/10; the patient has 2 remaining wounds 1 on the right posterior and the other on the left anterior. Both of these look quite good. No debridement is required we have been using Hydrofera Blue under compression. 12/17; the patient has really not closed over which is disappointing. His wraps were very tight and there may be some wrap injury issues. On the left he has the original wound on the medial mid tibia he has a wrap injury on the lateral mid tibia. On the right his posterior areas are superficial but certainly not closed 12/31; the patient has totally closed on the left. He will transition to his own 20/30 below-knee stocking on the left leg today. Interestingly on the right posterior calf the 2  wounds that he had from last time of closed however there is an additional injury in this same area. Almost looks as though there is subcutaneous fluid/blistering. There is no evidence of cellulitis this does not seem tender 06/24/2019; the patient comes in having been placed in his 20/30 stockings on the  left leg last week. He states that he noted blisters break open on Saturday and they had opened into wounds on the anterior tibial area on the left by Monday. The area on the posterior right leg looks satisfactory we are still doing that and compression. We are using calcium alginate to all wound areas 1/14; the patient had juxta light however one of them was not the right size which will need to be returned. The new area last week on the left anterior tibia looks a lot better it measures smaller I would say the area on the right is about the same, this is on the right posterior calf. We have been using silver alginate 1/21; he has his bilateral juxta lites however the left anterior wound continues to look better where is the 2 areas on the right posterior calf look about the same. We have been using Hydrofera Blue under compression with the juxta lite stockings now on standby 2/4; the areas on the left are totally healed and he will be discharged into his own stocking/juxta light. On the right posterior calf he has what looks to be a blister with a small area of subdermal hemorrhage this is very tiny but I did I am going to wrap him today and see if this will be reabsorbed by next week 2/11; he comes in this week with expanded wounds on the posterior calf as well as a new wound on the left anterior. He said the left anterior started a few days ago with a blister that is now surface. He is using his juxta lite stockings at a pressure of 30/40 2/19; we wrapped his left leg last week after a failed attempt to discharge that leg and his juxta lites 2 weeks ago. The leg is again healed he has  superficial areas posteriorly on the right leg 2/26; the patient came in initially with right posterior calf wound secondary to minor trauma with secondary infection. He went on to develop a left lower leg wound. In the interim he had congestive heart failure with acute renal failure a fall with bilateral anterior leg wounds and finally Covid requiring admission to pneumonia with pneumonia. He is finally closed. He has external compression stockings bilaterally [juxta lite stockings]. His wounds are healed READMISSION 03/03/2023 This is a patient that we had here for a year from 20 20 through 20 21 with a sizable wound on the right posterior calf which was secondary to a combination of chronic venous insufficiency and secondary infection in the wound when he came here. This was a problematic wound in itself however he developed traumatic wounds on his lower extremities after a fall from a ladder during the same admission. Eventually he was healed out and discharged and juxta lite stockings. He tells me that everything was fine up until about 2-1/2 months ago he developed a blister and an open area on the left posterior calf this time. He saw his primary doctor and received a course of Bactrim. He has been using Silvadene with AandE ointment and covering this with gauze. He is here for review of this area. When he was here in 2021 he also saw Dr. Woodfin Ganja of vein and vascular he had venous reflux studies that showed deep venous reflux but nothing that was amenable to surgery. He also had arterial studies. He was noncompressible on the left but had normal TBIs and triphasic waveforms. In terms of the reflux studies I am not really sure whether the left  leg was involved in that study or not we will need to review that. His past medical history includes type type 2 diabetes, chronic venous insufficiency with secondary lymphedema, intermittent A-fib, nonischemic cardiomyopathy, thrombus of the left  atrial appendage, hereditary hemochromatosis cytosis. He is now on Coumadin presumably for the atrial fibrillation. [name], [name] ([id number]) [id number] Page 3 of 11 ABIs in this clinic were not repeated 9/24; posterior left calf wound. We used Hydrofera Blue under compression. The wound looks better this week better looking surface. 10/1. Posterior left calf wound secondary to venous insufficiency. He was new to the clinic in 2 weeks ago at which time I thought he was going to require a difficult set of mechanical debridements however the wound surface is continuing to improve using Hydrofera Blue although the dimensions not so remarkably improved today 10/8; posterior left calf wound secondary to chronic venous insufficiency. He continues to think nice progress here on this wound the wound is smaller better looking surface. He arrived in clinic today with an abrasion on his Achilles area on the left leg as well as what looked to be a potential deep tissue injury on the lateral lateral part of the left foot at the level of the base of the fifth metatarsal. 10/15 posterior left calf wound continues to improve. We have been using Prisma under ABDs under Urgo K2 lite's. The areas on the Achilles and the left lateral foot which were concerning last time of maintain skin integrity. He is not wearing stockings even on the right leg I talked to him about this. 10/22; posterior left calf wound measures slightly larger today. The abrasion injury in the Achilles send on the lateral fifth metatarsal base look improved. We have been using Prisma under compression I am going to change this to [facility] today 10/29; posterior left calf wound continues to progress nicely. Presumably an abrasion injury on the Achilles heel. I changed dressing to Hydrofera Blue. Will need to dress the area on the Achilles today. The area on the fifth metatarsal base is not open 11/5; posterior  left calf wound continues to improve nicely presumably an abrasion injury on the left Achilles also looks clean. He has a what looks to be an abrasion on the base of the fifth metatarsal it is not open but it looks as though there is been some friction on this area we will pad this today under the wrap. Patient with chronic venous insufficiency severe hemosiderin deposition and resultant wounds. He has been in the clinic before however that was for a wound on the right posterior calf. Notable he is not wearing stockings even on the uninvolved right leg today 11/11; his original wound on the posterior left calf continues to improve in dimensions. Once again he comes in today with some black eschar around the edge I wonder if this is bleeding. He has the area on the Achilles and now a new area on the left upper lower leg which he says he scratched. And finally he has an area on the right anterior mid tibia which he said he got from taking off his stocking 11/19; he has wounds on his bilateral lower legs. We have been wrapping with Urgo K2's. He has original wound on the left posterior calf, probably a wrap injury on the left lateral calf, area on the left Achilles, and finally the right medial lower leg. We have been using Hydrofera Blue 12/3; came in this with.a wound on his left posterior leg.  since then her has developed a more distal left wound and a right anterior. A wound of the left lateral has healed. We are using HFB with Urgo K2 Electronic Signature(s) Signed: 05/24/2023 9:47:40 AM By: Baltazar Najjar MD Entered By: Baltazar Najjar on 05/23/2023 19:53:46 -------------------------------------------------------------------------------- Physical Exam Details Patient Name: Date of Service: [name] Asper NDY J. 05/19/2023 1:30 PM Medical Record Number: [id number] Patient Account Number: [id number] Date of Birth/Sex: Treating RN: [birth year] (67 y.o. M) Primary Care Provider: Hillard Danker  Other Clinician: Referring Provider: Treating Provider/Extender: Shara Blazing in Treatment: 11 Constitutional Sitting or standing Blood Pressure is within target range for patient.. Pulse regular and within target range for patient.[name] Respirations regular, non-labored and within target range.. Temperature is normal and within the target range for the patient.[name] Appears in no distress. Cardiovascular minimal edema. Notes wound exam; dimensions on the original wound are better. Let lateral healed. I think the 2 wound he developed here are also better. No debridement. no infection. Electronic Signature(s) Signed: 05/24/2023 9:47:40 AM By: Baltazar Najjar MD Entered By: Baltazar Najjar on 05/23/2023 20:09:37 [name] ([id number]) [id number] Page 4 of 11 -------------------------------------------------------------------------------- Physician Orders Details Patient Name: Date of Service: BREYLAN, YENSER 05/19/2023 1:30 PM Medical Record Number: [id number] Patient Account Number: [id number] Date of Birth/Sex: Treating RN: [birth year] (67 y.o. Marlan Palau Primary Care Provider: Hillard Danker Other Clinician: Referring Provider: Treating Provider/Extender: Shara Blazing in Treatment: 11 The following information was scribed by: Samuella Bruin The information was scribed for: Baltazar Najjar Verbal / Phone Orders: No Diagnosis Coding Follow-up Appointments ppointment in 1 week. - Nurse visit Return A ppointment in 2 weeks. - Dr. Leanord Hawking Return A Anesthetic (In clinic) Topical Lidocaine 4% applied to wound bed Bathing/ Shower/ Hygiene May shower with protection but do not get wound dressing(s) wet. Protect dressing(s) with water repellant cover (for example, large plastic bag) or a cast cover and may then take shower. Edema Control - Orders / Instructions Elevate legs to the  level of the heart or above for 30 minutes daily and/or when sitting for 3-4 times a day throughout the day. Avoid standing for long periods of time. Exercise regularly Wound Treatment Wound #13 - Lower Leg Wound Laterality: Left, Posterior Cleanser: Soap and Water 1 x Per Week/15 Days Discharge Instructions: May shower and wash wound with dial antibacterial soap and water prior to dressing change. Cleanser: Vashe 5.8 (oz) 1 x Per Week/15 Days Discharge Instructions: Cleanse the wound with Vashe prior to applying a clean dressing using gauze sponges, not tissue or cotton balls. Peri-Wound Care: Sween Lotion (Moisturizing lotion) 1 x Per Week/15 Days Discharge Instructions: Apply moisturizing lotion as directed Prim Dressing: Hydrofera Blue Ready Transfer Foam, 4x5 (in/in) 1 x Per Week/15 Days ary Discharge Instructions: Apply to wound bed as instructed Secondary Dressing: ABD Pad, 8x10 1 x Per Week/15 Days Discharge Instructions: Apply over primary dressing as directed. Secondary Dressing: Optifoam Non-Adhesive Dressing, 4x4 in 1 x Per Week/15 Days Discharge Instructions: Apply to ankle, achilles, lateral part of foot for protection Compression Wrap: Urgo K2, (equivalent to a 4 layer) two layer compression system, regular 1 x Per Week/15 Days Discharge Instructions: Apply Urgo K2 as directed (alternative to 4 layer compression). Wound #14 - Calcaneus Wound Laterality: Left Cleanser: Soap and Water 1 x Per Week/15 Days Discharge Instructions: May shower and wash wound with dial antibacterial soap and water prior to dressing change. Cleanser: Vashe 5.8 (oz) 1 x Per  Week/15 Days Discharge Instructions: Cleanse the wound with Vashe prior to applying a clean dressing using gauze sponges, not tissue or cotton balls. Peri-Wound Care: Sween Lotion (Moisturizing lotion) 1 x Per Week/15 Days Discharge Instructions: Apply moisturizing lotion as directed Prim Dressing: Hydrofera Blue Ready Transfer  Foam, 4x5 (in/in) 1 x Per Week/15 Days ary Discharge Instructions: Apply to wound bed as instructed Secondary Dressing: ABD Pad, 8x10 1 x Per Week/15 Days Discharge Instructions: Apply over primary dressing as directed. Secondary Dressing: Optifoam Non-Adhesive Dressing, 4x4 in 1 x Per Week/15 Days Discharge Instructions: Apply to ankle, achilles, lateral part of foot for protection Compression Wrap: Urgo K2, (equivalent to a 4 layer) two layer compression system, regular 1 x Per Week/15 Days Discharge Instructions: Apply Urgo K2 as directed (alternative to 4 layer compression). Wound #15 - Lower Leg Wound Laterality: Right, Anterior [name], [name] ([id number]) [id number] Page 5 of 11 Cleanser: Soap and Water 1 x Per Week/15 Days Discharge Instructions: May shower and wash wound with dial antibacterial soap and water prior to dressing change. Cleanser: Vashe 5.8 (oz) 1 x Per Week/15 Days Discharge Instructions: Cleanse the wound with Vashe prior to applying a clean dressing using gauze sponges, not tissue or cotton balls. Peri-Wound Care: Sween Lotion (Moisturizing lotion) 1 x Per Week/15 Days Discharge Instructions: Apply moisturizing lotion as directed Prim Dressing: Hydrofera Blue Ready Transfer Foam, 4x5 (in/in) 1 x Per Week/15 Days ary Discharge Instructions: Apply to wound bed as instructed Secondary Dressing: ABD Pad, 8x10 1 x Per Week/15 Days Discharge Instructions: Apply over primary dressing as directed. Secondary Dressing: Optifoam Non-Adhesive Dressing, 4x4 in 1 x Per Week/15 Days Discharge Instructions: Apply to ankle, achilles, lateral part of foot for protection Compression Wrap: Urgo K2, (equivalent to a 4 layer) two layer compression system, regular 1 x Per Week/15 Days Discharge Instructions: Apply Urgo K2 as directed (alternative to 4 layer compression). Wound #16 - Lower Leg Wound Laterality: Left, Lateral Cleanser: Soap and Water 1 x Per  Week/15 Days Discharge Instructions: May shower and wash wound with dial antibacterial soap and water prior to dressing change. Cleanser: Vashe 5.8 (oz) 1 x Per Week/15 Days Discharge Instructions: Cleanse the wound with Vashe prior to applying a clean dressing using gauze sponges, not tissue or cotton balls. Peri-Wound Care: Sween Lotion (Moisturizing lotion) 1 x Per Week/15 Days Discharge Instructions: Apply moisturizing lotion as directed Prim Dressing: Hydrofera Blue Ready Transfer Foam, 4x5 (in/in) 1 x Per Week/15 Days ary Discharge Instructions: Apply to wound bed as instructed Secondary Dressing: ABD Pad, 8x10 1 x Per Week/15 Days Discharge Instructions: Apply over primary dressing as directed. Secondary Dressing: Optifoam Non-Adhesive Dressing, 4x4 in 1 x Per Week/15 Days Discharge Instructions: Apply to ankle, achilles, lateral part of foot for protection Compression Wrap: Urgo K2, (equivalent to a 4 layer) two layer compression system, regular 1 x Per Week/15 Days Discharge Instructions: Apply Urgo K2 as directed (alternative to 4 layer compression). Patient Medications llergies: No Known Allergies A Notifications Medication Indication Start End 05/19/2023 lidocaine DOSE topical 4 % cream - cream topical Electronic Signature(s) Signed: 05/19/2023 4:06:44 PM By: Samuella Bruin Signed: 05/24/2023 9:47:40 AM By: Baltazar Najjar MD Entered By: Samuella Bruin on 05/19/2023 15:56:39 -------------------------------------------------------------------------------- Problem List Details Patient Name: Date of Service: [name] Asper NDY J. 05/19/2023 1:30 PM Medical Record Number: [id number] Patient Account Number: [id number] Date of Birth/Sex: Treating RN: 1956-06-13 (66 y.o. M) Primary Care Provider: Hillard Danker Other Clinician: Referring Provider: Treating Provider/Extender: Farrell Ours  Weeks in Treatment: [address], [place] J ([id number])  [id number] Page 6 of 11 Active Problems ICD-10 Encounter Code Description Active Date MDM Diagnosis L97.828 Non-pressure chronic ulcer of other part of left lower leg with other specified 03/03/2023 No Yes severity I87.332 Chronic venous hypertension (idiopathic) with ulcer and inflammation of left 03/03/2023 No Yes lower extremity L97.328 Non-pressure chronic ulcer of left ankle with other specified severity 04/14/2023 No Yes E11.622 Type 2 diabetes mellitus with other skin ulcer 03/03/2023 No Yes L97.818 Non-pressure chronic ulcer of other part of right lower leg with other specified 04/27/2023 No Yes severity Inactive Problems Resolved Problems Electronic Signature(s) Signed: 05/24/2023 9:47:40 AM By: Baltazar Najjar MD Entered By: Baltazar Najjar on 05/23/2023 19:37:27 -------------------------------------------------------------------------------- Progress Note Details Patient Name: Date of Service: [name] Asper NDY J. 05/19/2023 1:30 PM Medical Record Number: [id number] Patient Account Number: [id number] Date of Birth/Sex: Treating RN: 05-Feb-1956 (67 y.o. M) Primary Care Provider: Hillard Danker Other Clinician: Referring Provider: Treating Provider/Extender: Shara Blazing in Treatment: 11 Subjective History of Present Illness (HPI) ADMISSION 07/29/2018 Mr. Gascoigne is a 67 year old man with either prediabetes or diabetes he is on glipizide. In late October to November 2019 he noted a scabbed area on the back of his right calf. He picked this off a few times but it would not heal. He saw his primary physician on 12/5. It was felt at that time that this may actually heal on its own with conservative management. The next visit was on 07/20/2018 noted the area was a lot worse and arranged for his treatment here. He has been topical antibiotics like Neosporin although he stopped using this when the wound looked worse. He is  just been covering this with a clean Band-Aid. He is given Bactrim a week ago and he is finishing this currently. It is made some improvement in the surrounding erythema per the patient. He does not have a history of nonhealing wounds. No prior history of wounds on his legs that he had difficulty healing. No prior skin issues. 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 Past medical history; A. fib status post ablation and recent cardioversion he is on Xarelto. He has a history of systolic heart failure, cervical radiculopathy, cardiomyopathy and type 2 diabetes as discussed ABI in the right leg was noncompressible on the right 2/20; the biopsy I did on the patient last week was negative for malignancy. Culture grew Pseudomonas and methicillin sensitive staph aureus. He is on cefdinir 300 twice a day. I will have to make this a 10 day course. He put him in compression. He states that the redness pain and erythema are a lot better. I suspect the patient has chronic venous insufficiency probably with a secondary cellulitis 2/27; arrives today with copious amounts of drainage coming out of the wound irritating the skin below the wound area. He only renewed the final 3 days of cefdinir today 3/5; came in for a nurse change 3 days ago. Again a lot of drainage noted. Zinc oxide was applied unfortunately the drainage appears to a pool then he has a SESAR, BOSSIO ([id number]) [id number] Page 7 of 11 string of superficial open areas extending down into the Achilles area and some just below the wound. The wound itself does not look too bad. He is completed the antibiotics although apparently there was separation from the original 7 with the last 3 days. Culture grew a few MSSA and a few Pseudomonas 3/12; not too much  difference over the last week. He came in for a dressing change on Monday by our nursing staff. Necrotic debris again over the wound surface. The entire  area looks irritated but nontender and I do not think shows obvious evidence of infection. We have not heard anything about the reflux studies. Also notable than in this diabetic man we had noncompressible vessels and although I can feel his pulses easily in his feet I will order arterial studies as well. 3/19-Patient had experience more pain has been dressed with a silver alginate, the wounds all have necrotic debris, very friable with easy bleeding with any kind of surface debridement including with gauze and Anasept and with a #3 curette. His vascular appointments unfortunately were all canceled on account of the virus outbreak resulting in studies only being done for emergent cases. His pulses are easily palpable in the lower extremity. He has open areas below the primary wound where he states the drainage which included purulent material also with some blood pooled and caused breakdown. 3/26; the patient was changed to Santyl with [facility] backing last week. This was largely because the silver alginate was sticking to the wound. He is still having quite a bit of pain. He tells [place] that the reflux studies and arterial studies we had attempted to arrange through vein and vascular are not being booked until mid May. Culture that was done last week showed both Serratia moderate and a few methicillin sensitive staph aureus I started him on Bactrim DS 1 p.o. twice daily on 3/23 for 7 days. He is here for follow-up. 4/3; patient is on Santyl with Hydrofera Blue. Patient complains of pain. Our intake nurse is noted drainage and some odor. He has completed Bactrim recently for Serratia and a few methicillin sensitive staph aureus. After our staff push hard last week we were able to get venous studies as well as arterial studies. His arterial studies showed on the right great toe pressure of 0.86. On the left ABI at 1.47 TBI of 0.87. On both sides waveforms were triphasic VENOUS STUDIES showed reflux  in the femoral vein, popliteal vein great saphenous vein at the saphenofemoral junction and great saphenous vein at the proximal thigh. Also noted to have age-indeterminate superficial vein thrombosis involving the small saphenous vein. Notable that the patient is already on Xarelto for atrial fibrillation. 4/9; the patient has been to see vascular surgery and reviewed by Dr. Durwin Nora. He was felt to have only minimal superficial venous reflux in the right great greater saphenous vein. For this reason he was not felt to be a candidate for laser ablation of the right greater saphenous vein. A wedge cushion was suggested to keep his leg elevated. He does have deep venous reflux on the right. Culture I did last time showed a combination of Serratia Pseudomonas and methicillin sensitive staph aureus. We have been using Hydrofera Blue to the large wound, Santyl to some of the deep punched out satellite lesions distal to it. There is some improvement 4/16; the application for Apligraf was put out for further review for material that we have resubmitted. He has the deep area on the right posterior calf which is large and then 3 small punched out areas beneath this. In general his wounds look a lot better 4/23; the patient has his large original wound and then 2 punched out satellite lesions below it on the right posterior calf. I was usually able to use Apligraf #1 to cover the full surface area  of the larger wound. We continued with Santyl and Hydrofera Blue on the 2 punched-out areas 5/7; patient has his large original wound and 2 punched out satellite lesions below it in the right posterior calf. Apligraf #2 today. Major improvement in the big wound in terms of wound depth. Punched-out [place] wounds have a very healthy looking wound bed. 5/21-Patient comes back for his large right posterior calf wound for which she has been an Apligraf #3 today. Wound depth seems to be better, the punched-out wounds appear  to have healthy bed with some bleeding 6/4; right posterior calf wound is much better. Apligraf #4 today. The major wound has no wound depth at all. Only 1 of the satellite lesions is still open. The patient has very high blood pressure coming in today with a diastolic blood pressure of 130 after lying there for a few minutes it was down to 110. He states that is running 100 210 diastolic at home. He was high last week as well I thought he was on 50 mg 1/2 tablet twice daily of losartan I told him to double up on that however it turns out he is actually on 50 twice daily. Course he is run out of his medications prematurely. He has a follow-up with his primary's office next Wednesday. 6/18; patient's wounds continue to improve. The small area laterally is just about closed and there is continued epithelialization on the area on the posterior calf. 7/2; we applied his last Apligraf last visit. Early the next week there was a lot of purulent looking drainage that I cultured this grew staph and Pseudomonas however when we had him back for the next visit to 3 days later everything looked a lot better. He did not receive systemic antibiotics. Fortunately we did not have to remove the Apligraf. He has had heart trouble this week he was having an ablation apparently they have discovered a clot in his left atrium they have changed all his anticoagulants as well as his blood pressure medication. 7/16; using Hydrofera Blue. We are making nice progress towards closure. He has not yet ordered his compression stockings he has his measurements 7/23; dimensions are better. He has a new satellite area superiorly. Both wounds cauterized with silver nitrate. 7/30; absolutely no change in the major wound on the posterior part of the right calf. We have been using Hydrofera Blue for a prolonged period of time and really had a nice improvement but over the last few weeks this is stalled. There is no depth. He arrived in  clinic today with a new area on the right anterior tibia which apparently was an "ingrown hair". It is clearly an open area. This looks like a wrap injury although he was not really aware of it. 8/31; since the patient was last in clinic he was admitted to hospital from 8/17 through 8/22. He had had multiple falls at home including one off a ladder. He was admitted to hospital with mild COVID-19 viral pneumonia. Noted to be hyperkalemic and in acute renal failure. He has chronic systolic heart failure with ejection fraction of 35%. He arrives back in clinic with the original wound on the posterior aspect of the right lower calf looking a lot better. He has eschared areas from falls and lacerations on his anterior knees bilaterally just below the patella and on the mid part of his tibia bilaterally. He seems to have a small area on the right lateral ankle as well 9/10- Patient comes to clinic  with a 2 new wounds one on the left anterior shin and one on the right posterior leg both the left knee and right knee areas are healed up. We have been using Santyl to the left anterior leg and silver alginate to right posterior leg wounds. 9/17; the patient is original wound looks good on the posterior right calf. He has traumatic areas on the left anterior shin left anterior patellar tendon and the right mid tibia area. Except for the right posterior calf all required debridement. We have been using Santyl on these areas and silver alginate posteriorly right calf 9/24; the original wound on the posterior calf on the right looks good. This is healthy. There are traumatic wounds in the left anterior shin, left anterior patella in the right mid tibia area are about the same. He has dusky erythema on the left anterior tibia which led me to give him antibiotics last week. This does not look too much different. This is nontender and I wonder if this is all just venous inflammation. 10/2; the patient has 2 open areas  on the right posterior calf both of these look good we have been using silver alginate. He has traumatic wounds on the left and right mid tibia areas. The surface of these wounds is still not ready for closure. We have been using Iodoflex. Finally has areas on the left knee. The latter wounds are all traumatic after a fall 10/9. His original wounds of the right posterior calf are superficial and progressing towards closure. His traumatic wounds on the left anterior tibia and right anterior tibia are considerably improved in terms of the wound surface. He asked me about a skin graft in these areas I do not think this is going to be necessary. Change from Iodosorb to [facility]. The area on the left knee is just about closed as well 10/16; his original wound on the right posterior calf very superficial looks healthy. He has more recent traumatic wounds on the left anterior tibia and right anterior tibia both of these have better looking surfaces and measuring smaller. Might be beneficial for an advanced treatment product. The area on his left knee has a small superficial scab and we are going to close that when 10/23; his original wound areas on the right posterior calf continue to improve. The more recently traumatic wounds on the left anterior tibia and right anterior tibia both look better in terms of surfaces. No debridement required. We have been using Hydrofera Blue. He is approved for Apligraf 11/3; Apligraf #1 applied to the left anterior tibia right anterior tibia and to 2 small areas remaining on the right posterior calf which were part of his initial wound area. 11/19; Apligraf #2. Left anterior tibia is healed. Right anterior tibia much better. Only one small area remains on the right posterior calf which was part of his initial wound area 12/3; the left anterior tibia has opened up again. Hyper granulated nodule. Right anterior tibia is superficial and larger. There is no depth of  this. I did not place Apligraf today return to [facility] under compression 12/10; the patient has 2 remaining wounds 1 on the right posterior and the other on the left anterior. Both of these look quite good. No debridement is required [name], [name] ([id number]) [id number] Page 8 of 11 we have been using Hydrofera Blue under compression. 12/17; the patient has really not closed over which is disappointing. His wraps were very tight and there may be some  wrap injury issues. On the left he has the original wound on the medial mid tibia he has a wrap injury on the lateral mid tibia. On the right his posterior areas are superficial but certainly not closed 12/31; the patient has totally closed on the left. He will transition to his own 20/30 below-knee stocking on the left leg today. Interestingly on the right posterior calf the 2 wounds that he had from last time of closed however there is an additional injury in this same area. Almost looks as though there is subcutaneous fluid/blistering. There is no evidence of cellulitis this does not seem tender 06/24/2019; the patient comes in having been placed in his 20/30 stockings on the left leg last week. He states that he noted blisters break open on Saturday and they had opened into wounds on the anterior tibial area on the left by Monday. The area on the posterior right leg looks satisfactory we are still doing that and compression. We are using calcium alginate to all wound areas 1/14; the patient had juxta light however one of them was not the right size which will need to be returned. The new area last week on the left anterior tibia looks a lot better it measures smaller I would say the area on the right is about the same, this is on the right posterior calf. We have been using silver alginate 1/21; he has his bilateral juxta lites however the left anterior wound continues to look better where is the 2 areas on the  right posterior calf look about the same. We have been using Hydrofera Blue under compression with the juxta lite stockings now on standby 2/4; the areas on the left are totally healed and he will be discharged into his own stocking/juxta light. On the right posterior calf he has what looks to be a blister with a small area of subdermal hemorrhage this is very tiny but I did I am going to wrap him today and see if this will be reabsorbed by next week 2/11; he comes in this week with expanded wounds on the posterior calf as well as a new wound on the left anterior. He said the left anterior started a few days ago with a blister that is now surface. He is using his juxta lite stockings at a pressure of 30/40 2/19; we wrapped his left leg last week after a failed attempt to discharge that leg and his juxta lites 2 weeks ago. The leg is again healed he has superficial areas posteriorly on the right leg 2/26; the patient came in initially with right posterior calf wound secondary to minor trauma with secondary infection. He went on to develop a left lower leg wound. In the interim he had congestive heart failure with acute renal failure a fall with bilateral anterior leg wounds and finally Covid requiring admission to pneumonia with pneumonia. He is finally closed. He has external compression stockings bilaterally [juxta lite stockings]. His wounds are healed READMISSION 03/03/2023 This is a patient that we had here for a year from 20 20 through 20 21 with a sizable wound on the right posterior calf which was secondary to a combination of chronic venous insufficiency and secondary infection in the wound when he came here. This was a problematic wound in itself however he developed traumatic wounds on his lower extremities after a fall from a ladder during the same admission. Eventually he was healed out and discharged and juxta lite stockings. He tells me that  everything was fine up until about 2-1/2  months ago he developed a blister and an open area on the left posterior calf this time. He saw his primary doctor and received a course of Bactrim. He has been using Silvadene with AandE ointment and covering this with gauze. He is here for review of this area. When he was here in 2021 he also saw Dr. Woodfin Ganja of vein and vascular he had venous reflux studies that showed deep venous reflux but nothing that was amenable to surgery. He also had arterial studies. He was noncompressible on the left but had normal TBIs and triphasic waveforms. In terms of the reflux studies I am not really sure whether the left leg was involved in that study or not we will need to review that. His past medical history includes type type 2 diabetes, chronic venous insufficiency with secondary lymphedema, intermittent A-fib, nonischemic cardiomyopathy, thrombus of the left atrial appendage, hereditary hemochromatosis cytosis. He is now on Coumadin presumably for the atrial fibrillation. ABIs in this clinic were not repeated 9/24; posterior left calf wound. We used Hydrofera Blue under compression. The wound looks better this week better looking surface. 10/1. Posterior left calf wound secondary to venous insufficiency. He was new to the clinic in 2 weeks ago at which time I thought he was going to require a difficult set of mechanical debridements however the wound surface is continuing to improve using Hydrofera Blue although the dimensions not so remarkably improved today 10/8; posterior left calf wound secondary to chronic venous insufficiency. He continues to think nice progress here on this wound the wound is smaller better looking surface. He arrived in clinic today with an abrasion on his Achilles area on the left leg as well as what looked to be a potential deep tissue injury on the lateral lateral part of the left foot at the level of the base of the fifth metatarsal. 10/15 posterior left calf wound continues  to improve. We have been using Prisma under ABDs under Urgo K2 lite's. The areas on the Achilles and the left lateral foot which were concerning last time of maintain skin integrity. He is not wearing stockings even on the right leg I talked to him about this. 10/22; posterior left calf wound measures slightly larger today. The abrasion injury in the Achilles send on the lateral fifth metatarsal base look improved. We have been using Prisma under compression I am going to change this to [facility] today 10/29; posterior left calf wound continues to progress nicely. Presumably an abrasion injury on the Achilles heel. I changed dressing to Hydrofera Blue. Will need to dress the area on the Achilles today. The area on the fifth metatarsal base is not open 11/5; posterior left calf wound continues to improve nicely presumably an abrasion injury on the left Achilles also looks clean. He has a what looks to be an abrasion on the base of the fifth metatarsal it is not open but it looks as though there is been some friction on this area we will pad this today under the wrap. Patient with chronic venous insufficiency severe hemosiderin deposition and resultant wounds. He has been in the clinic before however that was for a wound on the right posterior calf. Notable he is not wearing stockings even on the uninvolved right leg today 11/11; his original wound on the posterior left calf continues to improve in dimensions. Once again he comes in today with some black eschar around the edge I wonder  if this is bleeding. He has the area on the Achilles and now a new area on the left upper lower leg which he says he scratched. And finally he has an area on the right anterior mid tibia which he said he got from taking off his stocking 11/19; he has wounds on his bilateral lower legs. We have been wrapping with Urgo K2's. He has original wound on the left posterior calf, probably a wrap injury on the left lateral  calf, area on the left Achilles, and finally the right medial lower leg. We have been using Hydrofera Blue 12/3; came in this with.a wound on his left posterior leg. since then her has developed a more distal left wound and a right anterior. A wound of the left lateral has healed. We are using HFB with Urgo [address] RHYLEE, WORTHING ([id number]) [id number] Page 9 of 11 Sitting or standing Blood Pressure is within target range for patient.. Pulse regular and within target range for patient.[name] Respirations regular, non-labored and within target range.. Temperature is normal and within the target range for the patient.[name] Appears in no distress. Vitals Time Taken: 1:15 PM, Height: 73 in, Weight: 254 lbs, BMI: 33.5, Temperature: 98.2 F, Pulse: 70 bpm, Respiratory Rate: 18 breaths/min, Blood Pressure: 111/76 mmHg. Cardiovascular minimal edema. General Notes: wound exam; dimensions on the original wound are better. Let lateral healed. I think the 2 wound he developed here are also better. No debridement. no infection. Integumentary (Hair, Skin) Wound #13 status is Open. Original cause of wound was Blister. The date acquired was: 01/20/2023. The wound has been in treatment 11 weeks. The wound is located on the Left,Posterior Lower Leg. The wound measures 1.7cm length x 0.7cm width x 0.1cm depth; 0.935cm^2 area and 0.093cm^3 volume. There is Fat Layer (Subcutaneous Tissue) exposed. There is no tunneling or undermining noted. There is a small amount of serosanguineous drainage noted. The wound margin is distinct with the outline attached to the wound base. There is large (67-100%) red granulation within the wound bed. There is a small (1-33%) amount of necrotic tissue within the wound bed including Eschar. The periwound skin appearance had no abnormalities noted for moisture. The periwound skin appearance exhibited: Scarring, Hemosiderin Staining. Periwound  temperature was noted as No Abnormality. Wound #14 status is Open. Original cause of wound was Blister. The date acquired was: 04/07/2023. The wound has been in treatment 5 weeks. The wound is located on the Left Calcaneus. The wound measures 1.4cm length x 0.8cm width x 0.1cm depth; 0.88cm^2 area and 0.088cm^3 volume. There is Fat Layer (Subcutaneous Tissue) exposed. There is no tunneling or undermining noted. There is a medium amount of serosanguineous drainage noted. The wound margin is distinct with the outline attached to the wound base. There is medium (34-66%) red granulation within the wound bed. There is a medium (34-66%) amount of necrotic tissue within the wound bed including Adherent Slough. The periwound skin appearance had no abnormalities noted for moisture. The periwound skin appearance exhibited: Scarring, Hemosiderin Staining. Periwound temperature was noted as No Abnormality. Wound #15 status is Open. Original cause of wound was Trauma. The date acquired was: 02/25/2023. The wound has been in treatment 3 weeks. The wound is located on the Right,Anterior Lower Leg. The wound measures 2cm length x 1.5cm width x 0.1cm depth; 2.356cm^2 area and 0.236cm^3 volume. There is Fat Layer (Subcutaneous Tissue) exposed. There is no tunneling or undermining noted. There is a medium amount of serosanguineous drainage noted. The  wound margin is distinct with the outline attached to the wound base. There is large (67-100%) red granulation within the wound bed. There is no necrotic tissue within the wound bed. The periwound skin appearance had no abnormalities noted for texture. The periwound skin appearance had no abnormalities noted for moisture. The periwound skin appearance exhibited: Hemosiderin Staining. Periwound temperature was noted as No Abnormality. Wound #16 status is Open. Original cause of wound was Gradually Appeared. The date acquired was: 04/27/2023. The wound has been in treatment 3  weeks. The wound is located on the Left,Lateral Lower Leg. The wound measures 0.3cm length x 0.3cm width x 0.1cm depth; 0.071cm^2 area and 0.007cm^3 volume. There is Fat Layer (Subcutaneous Tissue) exposed. There is no tunneling or undermining noted. There is a none present amount of drainage noted. The wound margin is distinct with the outline attached to the wound base. There is no granulation within the wound bed. There is a large (67-100%) amount of necrotic tissue within the wound bed including Eschar. The periwound skin appearance had no abnormalities noted for texture. The periwound skin appearance had no abnormalities noted for moisture. The periwound skin appearance exhibited: Hemosiderin Staining. Periwound temperature was noted as No Abnormality. Assessment Active Problems ICD-10 Non-pressure chronic ulcer of other part of left lower leg with other specified severity Chronic venous hypertension (idiopathic) with ulcer and inflammation of left lower extremity Non-pressure chronic ulcer of left ankle with other specified severity Type 2 diabetes mellitus with other skin ulcer Non-pressure chronic ulcer of other part of right lower leg with other specified severity Procedures Wound #13 Pre-procedure diagnosis of Wound #13 is a Venous Leg Ulcer located on the Left,Posterior Lower Leg . There was a Double Layer Compression Therapy Procedure by Samuella Bruin, RN. Post procedure Diagnosis Wound #13: Same as Pre-Procedure Wound #15 Pre-procedure diagnosis of Wound #15 is an Abrasion located on the Right,Anterior Lower Leg . There was a Double Layer Compression Therapy Procedure by Samuella Bruin, RN. Post procedure Diagnosis Wound #15: Same as Pre-Procedure Plan Follow-up Appointments: Return Appointment in 1 week. - Nurse visit Return Appointment in 2 weeks. - Dr. Leanord Hawking Anesthetic: (In clinic) Topical Lidocaine 4% applied to wound bed Bathing/ Shower/ Hygiene: [name], [name] ([id number]) [phone].pdf Page 10 of 11 May shower with protection but do not get wound dressing(s) wet. Protect dressing(s) with water repellant cover (for example, large plastic bag) or a cast cover and may then take shower. Edema Control - Orders / Instructions: Elevate legs to the level of the heart or above for 30 minutes daily and/or when sitting for 3-4 times a day throughout the day. Avoid standing for long periods of time. Exercise regularly The following medication(s) was prescribed: lidocaine topical 4 % cream cream topical was prescribed at facility WOUND #13: - Lower Leg Wound Laterality: Left, Posterior Cleanser: Soap and Water 1 x Per Week/15 Days Discharge Instructions: May shower and wash wound with dial antibacterial soap and water prior to dressing change. Cleanser: Vashe 5.8 (oz) 1 x Per Week/15 Days Discharge Instructions: Cleanse the wound with Vashe prior to applying a clean dressing using gauze sponges, not tissue or cotton balls. Peri-Wound Care: Sween Lotion (Moisturizing lotion) 1 x Per Week/15 Days Discharge Instructions: Apply moisturizing lotion as directed Prim Dressing: Hydrofera Blue Ready Transfer Foam, 4x5 (in/in) 1 x Per Week/15 Days ary Discharge Instructions: Apply to wound bed as instructed Secondary Dressing: ABD Pad, 8x10 1 x Per Week/15 Days Discharge Instructions: Apply over primary dressing as  directed. Secondary Dressing: Optifoam Non-Adhesive Dressing, 4x4 in 1 x Per Week/15 Days Discharge Instructions: Apply to ankle, achilles, lateral part of foot for protection Com pression Wrap: Urgo K2, (equivalent to a 4 layer) two layer compression system, regular 1 x Per Week/15 Days Discharge Instructions: Apply Urgo K2 as directed (alternative to 4 layer compression). WOUND #14: - Calcaneus Wound Laterality: Left Cleanser: Soap and Water 1 x Per Week/15 Days Discharge Instructions: May shower and wash wound with dial  antibacterial soap and water prior to dressing change. Cleanser: Vashe 5.8 (oz) 1 x Per Week/15 Days Discharge Instructions: Cleanse the wound with Vashe prior to applying a clean dressing using gauze sponges, not tissue or cotton balls. Peri-Wound Care: Sween Lotion (Moisturizing lotion) 1 x Per Week/15 Days Discharge Instructions: Apply moisturizing lotion as directed Prim Dressing: Hydrofera Blue Ready Transfer Foam, 4x5 (in/in) 1 x Per Week/15 Days ary Discharge Instructions: Apply to wound bed as instructed Secondary Dressing: ABD Pad, 8x10 1 x Per Week/15 Days Discharge Instructions: Apply over primary dressing as directed. Secondary Dressing: Optifoam Non-Adhesive Dressing, 4x4 in 1 x Per Week/15 Days Discharge Instructions: Apply to ankle, achilles, lateral part of foot for protection Com pression Wrap: Urgo K2, (equivalent to a 4 layer) two layer compression system, regular 1 x Per Week/15 Days Discharge Instructions: Apply Urgo K2 as directed (alternative to 4 layer compression). WOUND #15: - Lower Leg Wound Laterality: Right, Anterior Cleanser: Soap and Water 1 x Per Week/15 Days Discharge Instructions: May shower and wash wound with dial antibacterial soap and water prior to dressing change. Cleanser: Vashe 5.8 (oz) 1 x Per Week/15 Days Discharge Instructions: Cleanse the wound with Vashe prior to applying a clean dressing using gauze sponges, not tissue or cotton balls. Peri-Wound Care: Sween Lotion (Moisturizing lotion) 1 x Per Week/15 Days Discharge Instructions: Apply moisturizing lotion as directed Prim Dressing: Hydrofera Blue Ready Transfer Foam, 4x5 (in/in) 1 x Per Week/15 Days ary Discharge Instructions: Apply to wound bed as instructed Secondary Dressing: ABD Pad, 8x10 1 x Per Week/15 Days Discharge Instructions: Apply over primary dressing as directed. Secondary Dressing: Optifoam Non-Adhesive Dressing, 4x4 in 1 x Per Week/15 Days Discharge Instructions: Apply to  ankle, achilles, lateral part of foot for protection Com pression Wrap: Urgo K2, (equivalent to a 4 layer) two layer compression system, regular 1 x Per Week/15 Days Discharge Instructions: Apply Urgo K2 as directed (alternative to 4 layer compression). WOUND #16: - Lower Leg Wound Laterality: Left, Lateral Cleanser: Soap and Water 1 x Per Week/15 Days Discharge Instructions: May shower and wash wound with dial antibacterial soap and water prior to dressing change. Cleanser: Vashe 5.8 (oz) 1 x Per Week/15 Days Discharge Instructions: Cleanse the wound with Vashe prior to applying a clean dressing using gauze sponges, not tissue or cotton balls. Peri-Wound Care: Sween Lotion (Moisturizing lotion) 1 x Per Week/15 Days Discharge Instructions: Apply moisturizing lotion as directed Prim Dressing: Hydrofera Blue Ready Transfer Foam, 4x5 (in/in) 1 x Per Week/15 Days ary Discharge Instructions: Apply to wound bed as instructed Secondary Dressing: ABD Pad, 8x10 1 x Per Week/15 Days Discharge Instructions: Apply over primary dressing as directed. Secondary Dressing: Optifoam Non-Adhesive Dressing, 4x4 in 1 x Per Week/15 Days Discharge Instructions: Apply to ankle, achilles, lateral part of foot for protection Com pression Wrap: Urgo K2, (equivalent to a 4 layer) two layer compression system, regular 1 x Per Week/15 Days Discharge Instructions: Apply Urgo K2 as directed (alternative to 4 layer compression). wounds are imroving  still HFB Urgo K2 minimal edema Electronic Signature(s) Signed: 05/24/2023 9:47:40 AM By: Baltazar Najjar MD Entered By: Baltazar Najjar on 05/23/2023 20:11:12 [name] ([id number]) [id number] Page 11 of 11 -------------------------------------------------------------------------------- SuperBill Details Patient Name: Date of Service: [name], [name] 05/19/2023 Medical Record Number: [id number] Patient Account Number: [id number] Date of  Birth/Sex: Treating RN: December 26, 1955 (67 y.o. Marlan Palau Primary Care Provider: Hillard Danker Other Clinician: Referring Provider: Treating Provider/Extender: Shara Blazing in Treatment: 11 Diagnosis Coding ICD-10 Codes Code Description [phone] Non-pressure chronic ulcer of other part of left lower leg with other specified severity I87.332 Chronic venous hypertension (idiopathic) with ulcer and inflammation of left lower extremity L97.328 Non-pressure chronic ulcer of left ankle with other specified severity E11.622 Type 2 diabetes mellitus with other skin ulcer L97.818 Non-pressure chronic ulcer of other part of right lower leg with other specified severity Facility Procedures : CPT4: Code [id number] Description: 81 BILATERAL: Application of multi-layer venous compression system; leg (below knee), including ankle and t. ICD-10 Diagnosis Description L97.828 Non-pressure chronic ulcer of other part of left lower leg with other specified severity  L97.818 Non-pressure chronic ulcer of other part of right lower leg with other specified severity Modifier: Quantity: 1 Physician Procedures : CPT4 Code Description Modifier [id number] - WC PHYS LEVEL 3 - EST PT ICD-10 Diagnosis Description L97.828 Non-pressure chronic ulcer of other part of left lower leg with other specified severity L97.328 Non-pressure chronic ulcer of left ankle with  other specified severity L97.818 Non-pressure chronic ulcer of other part of right lower leg with other specified severity I87.332 Chronic venous hypertension (idiopathic) with ulcer and inflammation of left lower extremity Quantity: 1 Electronic Signature(s) Signed: 05/24/2023 9:47:40 AM By: Baltazar Najjar MD Previous Signature: 05/19/2023 4:06:44 PM Version By: Samuella Bruin Entered By: Baltazar Najjar on 05/23/2023 20:11:49

## 2023-05-26 ENCOUNTER — Encounter (HOSPITAL_BASED_OUTPATIENT_CLINIC_OR_DEPARTMENT_OTHER): Payer: Medicare HMO | Admitting: Internal Medicine

## 2023-05-26 DIAGNOSIS — I87332 Chronic venous hypertension (idiopathic) with ulcer and inflammation of left lower extremity: Secondary | ICD-10-CM | POA: Diagnosis not present

## 2023-05-26 DIAGNOSIS — L97828 Non-pressure chronic ulcer of other part of left lower leg with other specified severity: Secondary | ICD-10-CM | POA: Diagnosis not present

## 2023-05-26 DIAGNOSIS — E11622 Type 2 diabetes mellitus with other skin ulcer: Secondary | ICD-10-CM | POA: Diagnosis not present

## 2023-05-26 DIAGNOSIS — L97818 Non-pressure chronic ulcer of other part of right lower leg with other specified severity: Secondary | ICD-10-CM | POA: Diagnosis not present

## 2023-05-26 DIAGNOSIS — I480 Paroxysmal atrial fibrillation: Secondary | ICD-10-CM | POA: Diagnosis not present

## 2023-05-26 DIAGNOSIS — S80811A Abrasion, right lower leg, initial encounter: Secondary | ICD-10-CM | POA: Diagnosis not present

## 2023-05-26 DIAGNOSIS — I5022 Chronic systolic (congestive) heart failure: Secondary | ICD-10-CM | POA: Diagnosis not present

## 2023-05-26 DIAGNOSIS — I110 Hypertensive heart disease with heart failure: Secondary | ICD-10-CM | POA: Diagnosis not present

## 2023-05-26 DIAGNOSIS — I428 Other cardiomyopathies: Secondary | ICD-10-CM | POA: Diagnosis not present

## 2023-05-26 DIAGNOSIS — L97328 Non-pressure chronic ulcer of left ankle with other specified severity: Secondary | ICD-10-CM | POA: Diagnosis not present

## 2023-05-26 DIAGNOSIS — L97222 Non-pressure chronic ulcer of left calf with fat layer exposed: Secondary | ICD-10-CM | POA: Diagnosis not present

## 2023-05-26 DIAGNOSIS — L97812 Non-pressure chronic ulcer of other part of right lower leg with fat layer exposed: Secondary | ICD-10-CM | POA: Diagnosis not present

## 2023-05-27 NOTE — Progress Notes (Signed)
[name], [name] ([id number]) [id number] Page 1 of 10 Visit Report for 05/26/2023 HPI Details Patient Name: Date of Service: [name], [name] 05/26/2023 1:00 PM Medical Record Number: [id number] Patient Account Number: [id number] Date of Birth/Sex: Treating RN: 12-Feb-1956 (68 y.o. M) Primary Care Provider: Hillard Danker Other Clinician: Referring Provider: Treating Provider/Extender: Shara Blazing in Treatment: 12 History of Present Illness HPI Description: ADMISSION 07/29/2018 [name] is a 67 year old man with either prediabetes or diabetes he is on glipizide. In late October to November 2019 he noted a scabbed area on the back of his right calf. He picked this off a few times but it would not heal. He saw his primary physician on 12/5. It was felt at that time that this may actually heal on its own with conservative management. The next visit was on 07/20/2018 noted the area was a lot worse and arranged for his treatment here. He has been topical antibiotics like Neosporin although he stopped using this when the wound looked worse. He is just been covering this with a clean Band-Aid. He is given Bactrim a week ago and he is finishing this currently. It is made some improvement in the surrounding erythema per the patient. He does not have a history of nonhealing wounds. No prior history of wounds on his legs that he had difficulty healing. No prior skin issues. 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 Past medical history; A. fib status post ablation and recent cardioversion he is on Xarelto. He has a history of systolic heart failure, cervical radiculopathy, cardiomyopathy and type 2 diabetes as discussed ABI in the right leg was noncompressible on the right 2/20; the biopsy I did on the patient last week was negative for malignancy. Culture grew Pseudomonas and methicillin sensitive staph aureus. He is  on cefdinir 300 twice a day. I will have to make this a 10 day course. He put him in compression. He states that the redness pain and erythema are a lot better. I suspect the patient has chronic venous insufficiency probably with a secondary cellulitis 2/27; arrives today with copious amounts of drainage coming out of the wound irritating the skin below the wound area. He only renewed the final 3 days of cefdinir today 3/5; came in for a nurse change 3 days ago. Again a lot of drainage noted. Zinc oxide was applied unfortunately the drainage appears to a pool then he has a string of superficial open areas extending down into the Achilles area and some just below the wound. The wound itself does not look too bad. He is completed the antibiotics although apparently there was separation from the original 7 with the last 3 days. Culture grew a few MSSA and a few Pseudomonas 3/12; not too much difference over the last week. He came in for a dressing change on Monday by our nursing staff. Necrotic debris again over the wound surface. The entire area looks irritated but nontender and I do not think shows obvious evidence of infection. We have not heard anything about the reflux studies. Also notable than in this diabetic man we had noncompressible vessels and although I can feel his pulses easily in his feet I will order arterial studies as well. 3/19-Patient had experience more pain has been dressed with a silver alginate, the wounds all have necrotic debris, very friable with easy bleeding with any kind of surface debridement including with gauze and Anasept and with a #3 curette. His vascular  appointments unfortunately were all canceled on account of the virus outbreak resulting in studies only being done for emergent cases. His pulses are easily palpable in the lower extremity. He has open areas below the primary wound where he states the drainage which included purulent material also with some blood  pooled and caused breakdown. 3/26; the patient was changed to Santyl with [facility] backing last week. This was largely because the silver alginate was sticking to the wound. He is still having quite a bit of pain. He tells [place] that the reflux studies and arterial studies we had attempted to arrange through vein and vascular are not being booked until mid May. Culture that was done last week showed both Serratia moderate and a few methicillin sensitive staph aureus I started him on Bactrim DS 1 p.o. twice daily on 3/23 for 7 days. He is here for follow-up. 4/3; patient is on Santyl with Hydrofera Blue. Patient complains of pain. Our intake nurse is noted drainage and some odor. He has completed Bactrim recently for Serratia and a few methicillin sensitive staph aureus. After our staff push hard last week we were able to get venous studies as well as arterial studies. His arterial studies showed on the right great toe pressure of 0.86. On the left ABI at 1.47 TBI of 0.87. On both sides waveforms were triphasic VENOUS STUDIES showed reflux in the femoral vein, popliteal vein great saphenous vein at the saphenofemoral junction and great saphenous vein at the proximal thigh. Also noted to have age-indeterminate superficial vein thrombosis involving the small saphenous vein. Notable that the patient is already on Xarelto for atrial fibrillation. 4/9; the patient has been to see vascular surgery and reviewed by Dr. Durwin Nora. He was felt to have only minimal superficial venous reflux in the right great greater saphenous vein. For this reason he was not felt to be a candidate for laser ablation of the right greater saphenous vein. A wedge cushion was suggested to keep his leg elevated. He does have deep venous reflux on the right. Culture I did last time showed a combination of Serratia Pseudomonas and methicillin sensitive staph aureus. We have been using Hydrofera Blue to the large wound, Santyl to some of  the deep punched out satellite lesions distal to it. There is some improvement 4/16; the application for Apligraf was put out for further review for material that we have resubmitted. He has the deep area on the right posterior calf which is large and then 3 small punched out areas beneath this. In general his wounds look a lot better 4/23; the patient has his large original wound and then 2 punched out satellite lesions below it on the right posterior calf. I was usually able to use Apligraf #1 to cover the full surface area of the larger wound. We continued with Santyl and Hydrofera Blue on the 2 punched-out areas 5/7; patient has his large original wound and 2 punched out satellite lesions below it in the right posterior calf. Apligraf #2 today. Major improvement in the big wound in terms of wound depth. Punched-out [place] wounds have a very healthy looking wound bed. 5/21-Patient comes back for his large right posterior calf wound for which she has been an Apligraf #3 today. Wound depth seems to be better, the punched-out wounds appear to have healthy bed with some bleeding 6/4; right posterior calf wound is much better. Apligraf #4 today. The major wound has no wound depth at all. Only 1 of the satellite  lesions is still open. The patient has very high blood pressure coming in today with a diastolic blood pressure of 130 after lying there for a few minutes it was down to 110. He states that is running 100 210 diastolic at home. He was high last week as well I thought he was on 50 mg 1/2 tablet twice daily of losartan I told him to double up on that however it turns out he is actually on 50 twice daily. Course he is run out of his medications prematurely. He has a follow-up with his primary's office next Wednesday. [name], [name] ([id number]) [id number] Page 2 of 10 6/18; patient's wounds continue to improve. The small area laterally is just about closed and there is  continued epithelialization on the area on the posterior calf. 7/2; we applied his last Apligraf last visit. Early the next week there was a lot of purulent looking drainage that I cultured this grew staph and Pseudomonas however when we had him back for the next visit to 3 days later everything looked a lot better. He did not receive systemic antibiotics. Fortunately we did not have to remove the Apligraf. He has had heart trouble this week he was having an ablation apparently they have discovered a clot in his left atrium they have changed all his anticoagulants as well as his blood pressure medication. 7/16; using Hydrofera Blue. We are making nice progress towards closure. He has not yet ordered his compression stockings he has his measurements 7/23; dimensions are better. He has a new satellite area superiorly. Both wounds cauterized with silver nitrate. 7/30; absolutely no change in the major wound on the posterior part of the right calf. We have been using Hydrofera Blue for a prolonged period of time and really had a nice improvement but over the last few weeks this is stalled. There is no depth. He arrived in clinic today with a new area on the right anterior tibia which apparently was an "ingrown hair". It is clearly an open area. This looks like a wrap injury although he was not really aware of it. 8/31; since the patient was last in clinic he was admitted to hospital from 8/17 through 8/22. He had had multiple falls at home including one off a ladder. He was admitted to hospital with mild COVID-19 viral pneumonia. Noted to be hyperkalemic and in acute renal failure. He has chronic systolic heart failure with ejection fraction of 35%. He arrives back in clinic with the original wound on the posterior aspect of the right lower calf looking a lot better. He has eschared areas from falls and lacerations on his anterior knees bilaterally just below the patella and on the mid part of his tibia  bilaterally. He seems to have a small area on the right lateral ankle as well 9/10- Patient comes to clinic with a 2 new wounds one on the left anterior shin and one on the right posterior leg both the left knee and right knee areas are healed up. We have been using Santyl to the left anterior leg and silver alginate to right posterior leg wounds. 9/17; the patient is original wound looks good on the posterior right calf. He has traumatic areas on the left anterior shin left anterior patellar tendon and the right mid tibia area. Except for the right posterior calf all required debridement. We have been using Santyl on these areas and silver alginate posteriorly right calf 9/24; the original wound on the posterior calf on the  right looks good. This is healthy. There are traumatic wounds in the left anterior shin, left anterior patella in the right mid tibia area are about the same. He has dusky erythema on the left anterior tibia which led me to give him antibiotics last week. This does not look too much different. This is nontender and I wonder if this is all just venous inflammation. 10/2; the patient has 2 open areas on the right posterior calf both of these look good we have been using silver alginate. He has traumatic wounds on the left and right mid tibia areas. The surface of these wounds is still not ready for closure. We have been using Iodoflex. Finally has areas on the left knee. The latter wounds are all traumatic after a fall 10/9. His original wounds of the right posterior calf are superficial and progressing towards closure. His traumatic wounds on the left anterior tibia and right anterior tibia are considerably improved in terms of the wound surface. He asked me about a skin graft in these areas I do not think this is going to be necessary. Change from Iodosorb to [facility]. The area on the left knee is just about closed as well 10/16; his original wound on the right posterior  calf very superficial looks healthy. He has more recent traumatic wounds on the left anterior tibia and right anterior tibia both of these have better looking surfaces and measuring smaller. Might be beneficial for an advanced treatment product. The area on his left knee has a small superficial scab and we are going to close that when 10/23; his original wound areas on the right posterior calf continue to improve. The more recently traumatic wounds on the left anterior tibia and right anterior tibia both look better in terms of surfaces. No debridement required. We have been using Hydrofera Blue. He is approved for Apligraf 11/3; Apligraf #1 applied to the left anterior tibia right anterior tibia and to 2 small areas remaining on the right posterior calf which were part of his initial wound area. 11/19; Apligraf #2. Left anterior tibia is healed. Right anterior tibia much better. Only one small area remains on the right posterior calf which was part of his initial wound area 12/3; the left anterior tibia has opened up again. Hyper granulated nodule. Right anterior tibia is superficial and larger. There is no depth of this. I did not place Apligraf today return to [facility] under compression 12/10; the patient has 2 remaining wounds 1 on the right posterior and the other on the left anterior. Both of these look quite good. No debridement is required we have been using Hydrofera Blue under compression. 12/17; the patient has really not closed over which is disappointing. His wraps were very tight and there may be some wrap injury issues. On the left he has the original wound on the medial mid tibia he has a wrap injury on the lateral mid tibia. On the right his posterior areas are superficial but certainly not closed 12/31; the patient has totally closed on the left. He will transition to his own 20/30 below-knee stocking on the left leg today. Interestingly on the right posterior calf the 2  wounds that he had from last time of closed however there is an additional injury in this same area. Almost looks as though there is subcutaneous fluid/blistering. There is no evidence of cellulitis this does not seem tender 06/24/2019; the patient comes in having been placed in his 20/30 stockings on the  left leg last week. He states that he noted blisters break open on Saturday and they had opened into wounds on the anterior tibial area on the left by Monday. The area on the posterior right leg looks satisfactory we are still doing that and compression. We are using calcium alginate to all wound areas 1/14; the patient had juxta light however one of them was not the right size which will need to be returned. The new area last week on the left anterior tibia looks a lot better it measures smaller I would say the area on the right is about the same, this is on the right posterior calf. We have been using silver alginate 1/21; he has his bilateral juxta lites however the left anterior wound continues to look better where is the 2 areas on the right posterior calf look about the same. We have been using Hydrofera Blue under compression with the juxta lite stockings now on standby 2/4; the areas on the left are totally healed and he will be discharged into his own stocking/juxta light. On the right posterior calf he has what looks to be a blister with a small area of subdermal hemorrhage this is very tiny but I did I am going to wrap him today and see if this will be reabsorbed by next week 2/11; he comes in this week with expanded wounds on the posterior calf as well as a new wound on the left anterior. He said the left anterior started a few days ago with a blister that is now surface. He is using his juxta lite stockings at a pressure of 30/40 2/19; we wrapped his left leg last week after a failed attempt to discharge that leg and his juxta lites 2 weeks ago. The leg is again healed he has  superficial areas posteriorly on the right leg 2/26; the patient came in initially with right posterior calf wound secondary to minor trauma with secondary infection. He went on to develop a left lower leg wound. In the interim he had congestive heart failure with acute renal failure a fall with bilateral anterior leg wounds and finally Covid requiring admission to pneumonia with pneumonia. He is finally closed. He has external compression stockings bilaterally [juxta lite stockings]. His wounds are healed READMISSION 03/03/2023 This is a patient that we had here for a year from 20 20 through 20 21 with a sizable wound on the right posterior calf which was secondary to a combination of chronic venous insufficiency and secondary infection in the wound when he came here. This was a problematic wound in itself however he developed traumatic wounds on his lower extremities after a fall from a ladder during the same admission. Eventually he was healed out and discharged and juxta lite stockings. He tells me that everything was fine up until about 2-1/2 months ago he developed a blister and an open area on the left posterior calf this time. He saw his primary doctor and received a course of Bactrim. He has been using Silvadene with AandE ointment and covering this with gauze. He is here for review of this area. When he was here in 2021 he also saw Dr. Woodfin Ganja of vein and vascular he had venous reflux studies that showed deep venous reflux but nothing that was amenable to surgery. He also had arterial studies. He was noncompressible on the left but had normal TBIs and triphasic waveforms. In terms of the reflux studies I am not really sure whether the left  leg was involved in that study or not we will need to review that. His past medical history includes type type 2 diabetes, chronic venous insufficiency with secondary lymphedema, intermittent A-fib, nonischemic cardiomyopathy, thrombus of the left  atrial appendage, hereditary hemochromatosis cytosis. He is now on Coumadin presumably for the atrial fibrillation. [name], [name] ([id number]) [id number] Page 3 of 10 ABIs in this clinic were not repeated 9/24; posterior left calf wound. We used Hydrofera Blue under compression. The wound looks better this week better looking surface. 10/1. Posterior left calf wound secondary to venous insufficiency. He was new to the clinic in 2 weeks ago at which time I thought he was going to require a difficult set of mechanical debridements however the wound surface is continuing to improve using Hydrofera Blue although the dimensions not so remarkably improved today 10/8; posterior left calf wound secondary to chronic venous insufficiency. He continues to think nice progress here on this wound the wound is smaller better looking surface. He arrived in clinic today with an abrasion on his Achilles area on the left leg as well as what looked to be a potential deep tissue injury on the lateral lateral part of the left foot at the level of the base of the fifth metatarsal. 10/15 posterior left calf wound continues to improve. We have been using Prisma under ABDs under Urgo K2 lite's. The areas on the Achilles and the left lateral foot which were concerning last time of maintain skin integrity. He is not wearing stockings even on the right leg I talked to him about this. 10/22; posterior left calf wound measures slightly larger today. The abrasion injury in the Achilles send on the lateral fifth metatarsal base look improved. We have been using Prisma under compression I am going to change this to [facility] today 10/29; posterior left calf wound continues to progress nicely. Presumably an abrasion injury on the Achilles heel. I changed dressing to Hydrofera Blue. Will need to dress the area on the Achilles today. The area on the fifth metatarsal base is not open 11/5; posterior  left calf wound continues to improve nicely presumably an abrasion injury on the left Achilles also looks clean. He has a what looks to be an abrasion on the base of the fifth metatarsal it is not open but it looks as though there is been some friction on this area we will pad this today under the wrap. Patient with chronic venous insufficiency severe hemosiderin deposition and resultant wounds. He has been in the clinic before however that was for a wound on the right posterior calf. Notable he is not wearing stockings even on the uninvolved right leg today 11/11; his original wound on the posterior left calf continues to improve in dimensions. Once again he comes in today with some black eschar around the edge I wonder if this is bleeding. He has the area on the Achilles and now a new area on the left upper lower leg which he says he scratched. And finally he has an area on the right anterior mid tibia which he said he got from taking off his stocking 11/19; he has wounds on his bilateral lower legs. We have been wrapping with Urgo K2's. He has original wound on the left posterior calf, probably a wrap injury on the left lateral calf, area on the left Achilles, and finally the right medial lower leg. We have been using Hydrofera Blue 12/3; came in this with.a wound on his left posterior leg.  since then her has developed a more distal left wound and a right anterior. A wound of the left lateral has healed. We are using HFB with Urgo K2 12/10; the patient initially came in with a wound on his left posterior leg. This is healed today. He subsequently developed an area on the left Achilles heel and an area on the right anterior mid tibia area. We have been using Hydrofera Blue however intake reports that these are getting stuck to the wound. Also he has an area of painful erythema around the posterior left Achilles area. This could be rubbing in the wrap however I think coverage with antibiotics  for cellulitis is certainly indicated we will be changing the primary dressing to polymen silver Electronic Signature(s) Signed: 05/27/2023 10:03:28 AM By: Baltazar Najjar MD Entered By: Baltazar Najjar on 05/26/2023 13:42:50 -------------------------------------------------------------------------------- Physical Exam Details Patient Name: Date of Service: [name] Asper NDY J. 05/26/2023 1:00 PM Medical Record Number: [id number] Patient Account Number: [id number] Date of Birth/Sex: Treating RN: March 27, 1956 (67 y.o. M) Primary Care Provider: Hillard Danker Other Clinician: Referring Provider: Treating Provider/Extender: Shara Blazing in Treatment: 12 Constitutional Sitting or standing Blood Pressure is within target range for patient.. Pulse regular and within target range for patient.[name] Respirations regular, non-labored and within target range.. Temperature is normal and within the target range for the patient.[name] Appears in no distress. Notes Wound exam; left posterior calf wound is healed he still has a weeping left Achilles area wound with some surrounding tender erythema. The area on the right anterior mid tibial area also a fair amount of drainage but no evidence of infection. Edema control is good Electronic Signature(s) Signed: 05/27/2023 10:03:28 AM By: Baltazar Najjar MD Entered By: Baltazar Najjar on 05/26/2023 13:44:18 [name] ([id number]) [id number] Page 4 of 10 -------------------------------------------------------------------------------- Physician Orders Details Patient Name: Date of Service: [name], [name] 05/26/2023 1:00 PM Medical Record Number: [id number] Patient Account Number: [id number] Date of Birth/Sex: Treating RN: [birth year] (67 y.o. Harlon Flor, Millard.Loa Primary Care Provider: Hillard Danker Other Clinician: Referring Provider: Treating Provider/Extender: Shara Blazing in Treatment: 12 The following information was scribed by: Shawn Stall The information was scribed for: Baltazar Najjar Verbal / Phone Orders: No Diagnosis Coding ICD-10 Coding Code Description L97.828 Non-pressure chronic ulcer of other part of left lower leg with other specified severity I87.332 Chronic venous hypertension (idiopathic) with ulcer and inflammation of left lower extremity L97.328 Non-pressure chronic ulcer of left ankle with other specified severity E11.622 Type 2 diabetes mellitus with other skin ulcer L97.818 Non-pressure chronic ulcer of other part of right lower leg with other specified severity Follow-up Appointments ppointment in 1 week. - Dr. Leanord Hawking 06/02/2023 230pm (already schedule) Return A ppointment in 2 weeks. - Dr. Leanord Hawking (front office schedule) Return A Anesthetic (In clinic) Topical Lidocaine 4% applied to wound bed Bathing/ Shower/ Hygiene May shower with protection but do not get wound dressing(s) wet. Protect dressing(s) with water repellant cover (for example, large plastic bag) or a cast cover and may then take shower. Edema Control - Orders / Instructions Elevate legs to the level of the heart or above for 30 minutes daily and/or when sitting for 3-4 times a day throughout the day. Avoid standing for long periods of time. Exercise regularly Wound Treatment Wound #14 - Achilles Wound Laterality: Left, Posterior Cleanser: Soap and Water 1 x Per Week/15 Days Discharge Instructions: May shower and wash wound with dial antibacterial soap and water prior to  dressing change. Cleanser: Vashe 5.8 (oz) 1 x Per Week/15 Days Discharge Instructions: Cleanse the wound with Vashe prior to applying a clean dressing using gauze sponges, not tissue or cotton balls. Peri-Wound Care: Sween Lotion (Moisturizing lotion) 1 x Per Week/15 Days Discharge Instructions: Apply moisturizing lotion as directed Prim Dressing: PolyMem Silver Non-Adhesive  Dressing, 4.25x4.25 in 1 x Per Week/15 Days ary Discharge Instructions: Apply to wound bed as instructed Secondary Dressing: ABD Pad, 8x10 1 x Per Week/15 Days Discharge Instructions: Apply over primary dressing as directed. Compression Wrap: Urgo K2, (equivalent to a 4 layer) two layer compression system, regular 1 x Per Week/15 Days Discharge Instructions: Apply Urgo K2 as directed (alternative to 4 layer compression). Wound #15 - Lower Leg Wound Laterality: Right, Anterior Cleanser: Soap and Water 1 x Per Week/15 Days Discharge Instructions: May shower and wash wound with dial antibacterial soap and water prior to dressing change. Cleanser: Vashe 5.8 (oz) 1 x Per Week/15 Days Discharge Instructions: Cleanse the wound with Vashe prior to applying a clean dressing using gauze sponges, not tissue or cotton balls. Peri-Wound Care: Sween Lotion (Moisturizing lotion) 1 x Per Week/15 Days Discharge Instructions: Apply moisturizing lotion as directed Prim Dressing: PolyMem Silver Non-Adhesive Dressing, 4.25x4.25 in ary 1 x Per Week/15 Days [name], [name] ([id number]) [phone].pdf Page 5 of 10 Discharge Instructions: Apply to wound bed as instructed Secondary Dressing: ABD Pad, 8x10 1 x Per Week/15 Days Discharge Instructions: Apply over primary dressing as directed. Compression Wrap: Urgo K2, (equivalent to a 4 layer) two layer compression system, regular 1 x Per Week/15 Days Discharge Instructions: Apply Urgo K2 as directed (alternative to 4 layer compression). Patient Medications llergies: No Known Allergies A Notifications Medication Indication Start End 05/26/2023 doxycycline monohydrate DOSE oral 100 mg capsule - 1 capsule oral twice a day bid for 7 days Electronic Signature(s) Signed: 05/26/2023 1:46:07 PM By: Baltazar Najjar MD Entered By: Baltazar Najjar on 05/26/2023  13:46:06 -------------------------------------------------------------------------------- Problem List Details Patient Name: Date of Service: [name] Asper NDY J. 05/26/2023 1:00 PM Medical Record Number: [id number] Patient Account Number: [id number] Date of Birth/Sex: Treating RN: Jan 14, 1956 (67 y.o. [name] Primary Care Provider: Hillard Danker Other Clinician: Referring Provider: Treating Provider/Extender: Shara Blazing in Treatment: 12 Active Problems ICD-10 Encounter Code Description Active Date MDM Diagnosis [phone] Non-pressure chronic ulcer of other part of left lower leg with other specified 03/03/2023 No Yes severity I87.332 Chronic venous hypertension (idiopathic) with ulcer and inflammation of left 03/03/2023 No Yes lower extremity L97.328 Non-pressure chronic ulcer of left ankle with other specified severity 04/14/2023 No Yes E11.622 Type 2 diabetes mellitus with other skin ulcer 03/03/2023 No Yes L97.818 Non-pressure chronic ulcer of other part of right lower leg with other specified 04/27/2023 No Yes severity L03.116 Cellulitis of left lower limb 05/26/2023 No Yes Inactive Problems ARL, VANDORN ([id number]) [phone].pdf Page 6 of 10 Resolved Problems Electronic Signature(s) Signed: 05/27/2023 10:03:28 AM By: Baltazar Najjar MD Entered By: Baltazar Najjar on 05/26/2023 13:41:27 -------------------------------------------------------------------------------- Progress Note Details Patient Name: Date of Service: [name] Asper NDY J. 05/26/2023 1:00 PM Medical Record Number: [id number] Patient Account Number: [id number] Date of Birth/Sex: Treating RN: 08/27/1955 (67 y.o. M) Primary Care Provider: Hillard Danker Other Clinician: Referring Provider: Treating Provider/Extender: Shara Blazing in Treatment: 12 Subjective History of Present Illness  (HPI) ADMISSION 07/29/2018 Mr. Wakely is a 67 year old man with either prediabetes or diabetes he is on glipizide. In late October to November 2019 he noted a  scabbed area on the back of his right calf. He picked this off a few times but it would not heal. He saw his primary physician on 12/5. It was felt at that time that this may actually heal on its own with conservative management. The next visit was on 07/20/2018 noted the area was a lot worse and arranged for his treatment here. He has been topical antibiotics like Neosporin although he stopped using this when the wound looked worse. He is just been covering this with a clean Band-Aid. He is given Bactrim a week ago and he is finishing this currently. It is made some improvement in the surrounding erythema per the patient. He does not have a history of nonhealing wounds. No prior history of wounds on his legs that he had difficulty healing. No prior skin issues. 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 Past medical history; A. fib status post ablation and recent cardioversion he is on Xarelto. He has a history of systolic heart failure, cervical radiculopathy, cardiomyopathy and type 2 diabetes as discussed ABI in the right leg was noncompressible on the right 2/20; the biopsy I did on the patient last week was negative for malignancy. Culture grew Pseudomonas and methicillin sensitive staph aureus. He is on cefdinir 300 twice a day. I will have to make this a 10 day course. He put him in compression. He states that the redness pain and erythema are a lot better. I suspect the patient has chronic venous insufficiency probably with a secondary cellulitis 2/27; arrives today with copious amounts of drainage coming out of the wound irritating the skin below the wound area. He only renewed the final 3 days of cefdinir today 3/5; came in for a nurse change 3 days ago. Again a lot of drainage noted. Zinc oxide was applied unfortunately  the drainage appears to a pool then he has a string of superficial open areas extending down into the Achilles area and some just below the wound. The wound itself does not look too bad. He is completed the antibiotics although apparently there was separation from the original 7 with the last 3 days. Culture grew a few MSSA and a few Pseudomonas 3/12; not too much difference over the last week. He came in for a dressing change on Monday by our nursing staff. Necrotic debris again over the wound surface. The entire area looks irritated but nontender and I do not think shows obvious evidence of infection. We have not heard anything about the reflux studies. Also notable than in this diabetic man we had noncompressible vessels and although I can feel his pulses easily in his feet I will order arterial studies as well. 3/19-Patient had experience more pain has been dressed with a silver alginate, the wounds all have necrotic debris, very friable with easy bleeding with any kind of surface debridement including with gauze and Anasept and with a #3 curette. His vascular appointments unfortunately were all canceled on account of the virus outbreak resulting in studies only being done for emergent cases. His pulses are easily palpable in the lower extremity. He has open areas below the primary wound where he states the drainage which included purulent material also with some blood pooled and caused breakdown. 3/26; the patient was changed to Santyl with [facility] backing last week. This was largely because the silver alginate was sticking to the wound. He is still having quite a bit of pain. He tells [place] that the reflux studies  and arterial studies we had attempted to arrange through vein and vascular are not being booked until mid May. Culture that was done last week showed both Serratia moderate and a few methicillin sensitive staph aureus I started him on Bactrim DS 1 p.o. twice daily on 3/23 for 7  days. He is here for follow-up. 4/3; patient is on Santyl with Hydrofera Blue. Patient complains of pain. Our intake nurse is noted drainage and some odor. He has completed Bactrim recently for Serratia and a few methicillin sensitive staph aureus. After our staff push hard last week we were able to get venous studies as well as arterial studies. His arterial studies showed on the right great toe pressure of 0.86. On the left ABI at 1.47 TBI of 0.87. On both sides waveforms were triphasic VENOUS STUDIES showed reflux in the femoral vein, popliteal vein great saphenous vein at the saphenofemoral junction and great saphenous vein at the proximal thigh. Also noted to have age-indeterminate superficial vein thrombosis involving the small saphenous vein. Notable that the patient is already on Xarelto for atrial fibrillation. 4/9; the patient has been to see vascular surgery and reviewed by Dr. Durwin Nora. He was felt to have only minimal superficial venous reflux in the right great greater saphenous vein. For this reason he was not felt to be a candidate for laser ablation of the right greater saphenous vein. A wedge cushion was suggested to keep his leg elevated. He does have deep venous reflux on the right. Culture I did last time showed a combination of Serratia Pseudomonas and methicillin sensitive staph aureus. We have been using Hydrofera Blue to the large wound, Santyl to some of the deep punched out satellite lesions distal to it. There is some improvement 4/16; the application for Apligraf was put out for further review for material that we have resubmitted. He has the deep area on the right posterior calf which is large and then 3 small punched out areas beneath this. In general his wounds look a lot better 4/23; the patient has his large original wound and then 2 punched out satellite lesions below it on the right posterior calf. I was usually able to use Apligraf #1 to cover the full surface area  of the larger wound. We continued with Santyl and Hydrofera Blue on the 2 punched-out areas 5/7; patient has his large original wound and 2 punched out satellite lesions below it in the right posterior calf. Apligraf #2 today. Major improvement in the big wound in terms of wound depth. Punched-out [place] wounds have a very healthy looking wound bed. 5/21-Patient comes back for his large right posterior calf wound for which she has been an Apligraf #3 today. Wound depth seems to be better, the punched-out wounds appear to have healthy bed with some bleeding [name], [name] ([id number]) [phone].pdf Page 7 of 10 6/4; right posterior calf wound is much better. Apligraf #4 today. The major wound has no wound depth at all. Only 1 of the satellite lesions is still open. The patient has very high blood pressure coming in today with a diastolic blood pressure of 130 after lying there for a few minutes it was down to 110. He states that is running 100 210 diastolic at home. He was high last week as well I thought he was on 50 mg 1/2 tablet twice daily of losartan I told him to double up on that however it turns out he is actually on 50 twice daily. Course he is run  out of his medications prematurely. He has a follow-up with his primary's office next Wednesday. 6/18; patient's wounds continue to improve. The small area laterally is just about closed and there is continued epithelialization on the area on the posterior calf. 7/2; we applied his last Apligraf last visit. Early the next week there was a lot of purulent looking drainage that I cultured this grew staph and Pseudomonas however when we had him back for the next visit to 3 days later everything looked a lot better. He did not receive systemic antibiotics. Fortunately we did not have to remove the Apligraf. He has had heart trouble this week he was having an ablation apparently they have discovered a clot in his left atrium  they have changed all his anticoagulants as well as his blood pressure medication. 7/16; using Hydrofera Blue. We are making nice progress towards closure. He has not yet ordered his compression stockings he has his measurements 7/23; dimensions are better. He has a new satellite area superiorly. Both wounds cauterized with silver nitrate. 7/30; absolutely no change in the major wound on the posterior part of the right calf. We have been using Hydrofera Blue for a prolonged period of time and really had a nice improvement but over the last few weeks this is stalled. There is no depth. He arrived in clinic today with a new area on the right anterior tibia which apparently was an "ingrown hair". It is clearly an open area. This looks like a wrap injury although he was not really aware of it. 8/31; since the patient was last in clinic he was admitted to hospital from 8/17 through 8/22. He had had multiple falls at home including one off a ladder. He was admitted to hospital with mild COVID-19 viral pneumonia. Noted to be hyperkalemic and in acute renal failure. He has chronic systolic heart failure with ejection fraction of 35%. He arrives back in clinic with the original wound on the posterior aspect of the right lower calf looking a lot better. He has eschared areas from falls and lacerations on his anterior knees bilaterally just below the patella and on the mid part of his tibia bilaterally. He seems to have a small area on the right lateral ankle as well 9/10- Patient comes to clinic with a 2 new wounds one on the left anterior shin and one on the right posterior leg both the left knee and right knee areas are healed up. We have been using Santyl to the left anterior leg and silver alginate to right posterior leg wounds. 9/17; the patient is original wound looks good on the posterior right calf. He has traumatic areas on the left anterior shin left anterior patellar tendon and the right mid tibia  area. Except for the right posterior calf all required debridement. We have been using Santyl on these areas and silver alginate posteriorly right calf 9/24; the original wound on the posterior calf on the right looks good. This is healthy. There are traumatic wounds in the left anterior shin, left anterior patella in the right mid tibia area are about the same. He has dusky erythema on the left anterior tibia which led me to give him antibiotics last week. This does not look too much different. This is nontender and I wonder if this is all just venous inflammation. 10/2; the patient has 2 open areas on the right posterior calf both of these look good we have been using silver alginate. He has traumatic wounds on the  left and right mid tibia areas. The surface of these wounds is still not ready for closure. We have been using Iodoflex. Finally has areas on the left knee. The latter wounds are all traumatic after a fall 10/9. His original wounds of the right posterior calf are superficial and progressing towards closure. His traumatic wounds on the left anterior tibia and right anterior tibia are considerably improved in terms of the wound surface. He asked me about a skin graft in these areas I do not think this is going to be necessary. Change from Iodosorb to [facility]. The area on the left knee is just about closed as well 10/16; his original wound on the right posterior calf very superficial looks healthy. He has more recent traumatic wounds on the left anterior tibia and right anterior tibia both of these have better looking surfaces and measuring smaller. Might be beneficial for an advanced treatment product. The area on his left knee has a small superficial scab and we are going to close that when 10/23; his original wound areas on the right posterior calf continue to improve. The more recently traumatic wounds on the left anterior tibia and right anterior tibia both look better in terms  of surfaces. No debridement required. We have been using Hydrofera Blue. He is approved for Apligraf 11/3; Apligraf #1 applied to the left anterior tibia right anterior tibia and to 2 small areas remaining on the right posterior calf which were part of his initial wound area. 11/19; Apligraf #2. Left anterior tibia is healed. Right anterior tibia much better. Only one small area remains on the right posterior calf which was part of his initial wound area 12/3; the left anterior tibia has opened up again. Hyper granulated nodule. Right anterior tibia is superficial and larger. There is no depth of this. I did not place Apligraf today return to [facility] under compression 12/10; the patient has 2 remaining wounds 1 on the right posterior and the other on the left anterior. Both of these look quite good. No debridement is required we have been using Hydrofera Blue under compression. 12/17; the patient has really not closed over which is disappointing. His wraps were very tight and there may be some wrap injury issues. On the left he has the original wound on the medial mid tibia he has a wrap injury on the lateral mid tibia. On the right his posterior areas are superficial but certainly not closed 12/31; the patient has totally closed on the left. He will transition to his own 20/30 below-knee stocking on the left leg today. Interestingly on the right posterior calf the 2 wounds that he had from last time of closed however there is an additional injury in this same area. Almost looks as though there is subcutaneous fluid/blistering. There is no evidence of cellulitis this does not seem tender 06/24/2019; the patient comes in having been placed in his 20/30 stockings on the left leg last week. He states that he noted blisters break open on Saturday and they had opened into wounds on the anterior tibial area on the left by Monday. The area on the posterior right leg looks satisfactory we are still doing  that and compression. We are using calcium alginate to all wound areas 1/14; the patient had juxta light however one of them was not the right size which will need to be returned. The new area last week on the left anterior tibia looks a lot better it measures smaller I would  say the area on the right is about the same, this is on the right posterior calf. We have been using silver alginate 1/21; he has his bilateral juxta lites however the left anterior wound continues to look better where is the 2 areas on the right posterior calf look about the same. We have been using Hydrofera Blue under compression with the juxta lite stockings now on standby 2/4; the areas on the left are totally healed and he will be discharged into his own stocking/juxta light. On the right posterior calf he has what looks to be a blister with a small area of subdermal hemorrhage this is very tiny but I did I am going to wrap him today and see if this will be reabsorbed by next week 2/11; he comes in this week with expanded wounds on the posterior calf as well as a new wound on the left anterior. He said the left anterior started a few days ago with a blister that is now surface. He is using his juxta lite stockings at a pressure of 30/40 2/19; we wrapped his left leg last week after a failed attempt to discharge that leg and his juxta lites 2 weeks ago. The leg is again healed he has superficial areas posteriorly on the right leg 2/26; the patient came in initially with right posterior calf wound secondary to minor trauma with secondary infection. He went on to develop a left lower leg wound. In the interim he had congestive heart failure with acute renal failure a fall with bilateral anterior leg wounds and finally Covid requiring admission to pneumonia with pneumonia. He is finally closed. He has external compression stockings bilaterally [juxta lite stockings]. His wounds are healed READMISSION 03/03/2023 This is a  patient that we had here for a year from 20 20 through 20 21 with a sizable wound on the right posterior calf which was secondary to a combination of chronic venous insufficiency and secondary infection in the wound when he came here. This was a problematic wound in itself however he developed traumatic wounds on his lower extremities after a fall from a ladder during the same admission. Eventually he was healed out and discharged and juxta lite stockings. He tells me that everything was fine up until about 2-1/2 months ago he developed a blister and an open area on the left posterior calf this time. He saw his primary doctor and received a course of Bactrim. He has been using Silvadene with AandE ointment and covering this with gauze. He is here for review of this area. When he was here in 2021 he also saw Dr. Woodfin Ganja of vein and vascular he had venous reflux studies that showed deep venous reflux but nothing that [name], [name] ([id number]) [phone].pdf Page 8 of 10 was amenable to surgery. He also had arterial studies. He was noncompressible on the left but had normal TBIs and triphasic waveforms. In terms of the reflux studies I am not really sure whether the left leg was involved in that study or not we will need to review that. His past medical history includes type type 2 diabetes, chronic venous insufficiency with secondary lymphedema, intermittent A-fib, nonischemic cardiomyopathy, thrombus of the left atrial appendage, hereditary hemochromatosis cytosis. He is now on Coumadin presumably for the atrial fibrillation. ABIs in this clinic were not repeated 9/24; posterior left calf wound. We used Hydrofera Blue under compression. The wound looks better this week better looking surface. 10/1. Posterior left calf wound secondary to  venous insufficiency. He was new to the clinic in 2 weeks ago at which time I thought he was going to require a difficult set of  mechanical debridements however the wound surface is continuing to improve using Hydrofera Blue although the dimensions not so remarkably improved today 10/8; posterior left calf wound secondary to chronic venous insufficiency. He continues to think nice progress here on this wound the wound is smaller better looking surface. He arrived in clinic today with an abrasion on his Achilles area on the left leg as well as what looked to be a potential deep tissue injury on the lateral lateral part of the left foot at the level of the base of the fifth metatarsal. 10/15 posterior left calf wound continues to improve. We have been using Prisma under ABDs under Urgo K2 lite's. The areas on the Achilles and the left lateral foot which were concerning last time of maintain skin integrity. He is not wearing stockings even on the right leg I talked to him about this. 10/22; posterior left calf wound measures slightly larger today. The abrasion injury in the Achilles send on the lateral fifth metatarsal base look improved. We have been using Prisma under compression I am going to change this to [facility] today 10/29; posterior left calf wound continues to progress nicely. Presumably an abrasion injury on the Achilles heel. I changed dressing to Hydrofera Blue. Will need to dress the area on the Achilles today. The area on the fifth metatarsal base is not open 11/5; posterior left calf wound continues to improve nicely presumably an abrasion injury on the left Achilles also looks clean. He has a what looks to be an abrasion on the base of the fifth metatarsal it is not open but it looks as though there is been some friction on this area we will pad this today under the wrap. Patient with chronic venous insufficiency severe hemosiderin deposition and resultant wounds. He has been in the clinic before however that was for a wound on the right posterior calf. Notable he is not wearing stockings even on the  uninvolved right leg today 11/11; his original wound on the posterior left calf continues to improve in dimensions. Once again he comes in today with some black eschar around the edge I wonder if this is bleeding. He has the area on the Achilles and now a new area on the left upper lower leg which he says he scratched. And finally he has an area on the right anterior mid tibia which he said he got from taking off his stocking 11/19; he has wounds on his bilateral lower legs. We have been wrapping with Urgo K2's. He has original wound on the left posterior calf, probably a wrap injury on the left lateral calf, area on the left Achilles, and finally the right medial lower leg. We have been using Hydrofera Blue 12/3; came in this with.a wound on his left posterior leg. since then her has developed a more distal left wound and a right anterior. A wound of the left lateral has healed. We are using HFB with Urgo K2 12/10; the patient initially came in with a wound on his left posterior leg. This is healed today. He subsequently developed an area on the left Achilles heel and an area on the right anterior mid tibia area. We have been using Hydrofera Blue however intake reports that these are getting stuck to the wound. Also he has an area of painful erythema around the posterior  left Achilles area. This could be rubbing in the wrap however I think coverage with antibiotics for cellulitis is certainly indicated we will be changing the primary dressing to polymen silver Objective Constitutional Sitting or standing Blood Pressure is within target range for patient.. Pulse regular and within target range for patient.[name] Respirations regular, non-labored and within target range.. Temperature is normal and within the target range for the patient.[name] Appears in no distress. Vitals Time Taken: 1:10 PM, Height: 73 in, Weight: 254 lbs, BMI: 33.5, Temperature: 99 F, Pulse: 76 bpm, Respiratory Rate: 20 breaths/min, Blood  Pressure: 107/81 mmHg. General Notes: Wound exam; left posterior calf wound is healed he still has a weeping left Achilles area wound with some surrounding tender erythema. The area on the right anterior mid tibial area also a fair amount of drainage but no evidence of infection. Edema control is good Integumentary (Hair, Skin) Wound #13 status is Healed - Epithelialized. Original cause of wound was Blister. The date acquired was: 01/20/2023. The wound has been in treatment 12 weeks. The wound is located on the Left,Posterior Lower Leg. The wound measures 0cm length x 0cm width x 0cm depth; 0cm^2 area and 0cm^3 volume. There is Fat Layer (Subcutaneous Tissue) exposed. There is no tunneling or undermining noted. There is a none present amount of drainage noted. The wound margin is distinct with the outline attached to the wound base. There is no granulation within the wound bed. There is no necrotic tissue within the wound bed. The periwound skin appearance had no abnormalities noted for moisture. The periwound skin appearance exhibited: Scarring, Hemosiderin Staining. Periwound temperature was noted as No Abnormality. Wound #14 status is Open. Original cause of wound was Blister. The date acquired was: 04/07/2023. The wound has been in treatment 6 weeks. The wound is located on the Left,Posterior Achilles. The wound measures 2cm length x 2.3cm width x 0.1cm depth; 3.613cm^2 area and 0.361cm^3 volume. There is Fat Layer (Subcutaneous Tissue) exposed. There is no tunneling or undermining noted. There is a medium amount of serosanguineous drainage noted. The wound margin is distinct with the outline attached to the wound base. There is medium (34-66%) red granulation within the wound bed. There is a medium (34-66%) amount of necrotic tissue within the wound bed including Eschar. The periwound skin appearance had no abnormalities noted for moisture. The periwound skin appearance exhibited: Scarring,  Hemosiderin Staining. Periwound temperature was noted as No Abnormality. Wound #15 status is Open. Original cause of wound was Trauma. The date acquired was: 02/25/2023. The wound has been in treatment 4 weeks. The wound is located on the Right,Anterior Lower Leg. The wound measures 2.4cm length x 1.7cm width x 0.1cm depth; 3.204cm^2 area and 0.32cm^3 volume. There is Fat Layer (Subcutaneous Tissue) exposed. There is no tunneling or undermining noted. There is a medium amount of serosanguineous drainage noted. The wound margin is distinct with the outline attached to the wound base. There is large (67-100%) red granulation within the wound bed. There is a small (1-33%) amount of necrotic tissue within the wound bed including Adherent Slough. The periwound skin appearance had no abnormalities noted for texture. The periwound skin [name], [name] ([id number]) [id number] Page 9 of 10 appearance had no abnormalities noted for moisture. The periwound skin appearance exhibited: Hemosiderin Staining. The periwound skin appearance did not exhibit: Atrophie Blanche, Cyanosis, Ecchymosis, Mottled, Pallor, Rubor, Erythema. Periwound temperature was noted as No Abnormality. Wound #16 status is Healed - Epithelialized. Original cause of wound was Gradually Appeared.  The date acquired was: 04/27/2023. The wound has been in treatment 4 weeks. The wound is located on the Left,Lateral Lower Leg. The wound measures 0cm length x 0cm width x 0cm depth; 0cm^2 area and 0cm^3 volume. There is Fat Layer (Subcutaneous Tissue) exposed. There is no tunneling or undermining noted. There is a none present amount of drainage noted. The wound margin is distinct with the outline attached to the wound base. There is no granulation within the wound bed. There is no necrotic tissue within the wound bed. The periwound skin appearance had no abnormalities noted for texture. The periwound skin appearance had no  abnormalities noted for moisture. The periwound skin appearance exhibited: Hemosiderin Staining. Periwound temperature was noted as No Abnormality. Assessment Active Problems ICD-10 Non-pressure chronic ulcer of other part of left lower leg with other specified severity Chronic venous hypertension (idiopathic) with ulcer and inflammation of left lower extremity Non-pressure chronic ulcer of left ankle with other specified severity Type 2 diabetes mellitus with other skin ulcer Non-pressure chronic ulcer of other part of right lower leg with other specified severity Cellulitis of left lower limb Procedures Wound #14 Pre-procedure diagnosis of Wound #14 is a Diabetic Wound/Ulcer of the Lower Extremity located on the Left,Posterior Achilles . There was a Double Layer Compression Therapy Procedure by Shawn Stall, RN. Post procedure Diagnosis Wound #14: Same as Pre-Procedure Wound #15 Pre-procedure diagnosis of Wound #15 is an Abrasion located on the Right,Anterior Lower Leg . There was a Double Layer Compression Therapy Procedure by Shawn Stall, RN. Post procedure Diagnosis Wound #15: Same as Pre-Procedure Plan Follow-up Appointments: Return Appointment in 1 week. - Dr. Leanord Hawking 06/02/2023 230pm (already schedule) Return Appointment in 2 weeks. - Dr. Leanord Hawking (front office schedule) Anesthetic: (In clinic) Topical Lidocaine 4% applied to wound bed Bathing/ Shower/ Hygiene: May shower with protection but do not get wound dressing(s) wet. Protect dressing(s) with water repellant cover (for example, large plastic bag) or a cast cover and may then take shower. Edema Control - Orders / Instructions: Elevate legs to the level of the heart or above for 30 minutes daily and/or when sitting for 3-4 times a day throughout the day. Avoid standing for long periods of time. Exercise regularly The following medication(s) was prescribed: doxycycline monohydrate oral 100 mg capsule 1 capsule oral twice  a day bid for 7 days starting 05/26/2023 WOUND #14: - Achilles Wound Laterality: Left, Posterior Cleanser: Soap and Water 1 x Per Week/15 Days Discharge Instructions: May shower and wash wound with dial antibacterial soap and water prior to dressing change. Cleanser: Vashe 5.8 (oz) 1 x Per Week/15 Days Discharge Instructions: Cleanse the wound with Vashe prior to applying a clean dressing using gauze sponges, not tissue or cotton balls. Peri-Wound Care: Sween Lotion (Moisturizing lotion) 1 x Per Week/15 Days Discharge Instructions: Apply moisturizing lotion as directed Prim Dressing: PolyMem Silver Non-Adhesive Dressing, 4.25x4.25 in 1 x Per Week/15 Days ary Discharge Instructions: Apply to wound bed as instructed Secondary Dressing: ABD Pad, 8x10 1 x Per Week/15 Days Discharge Instructions: Apply over primary dressing as directed. Com pression Wrap: Urgo K2, (equivalent to a 4 layer) two layer compression system, regular 1 x Per Week/15 Days Discharge Instructions: Apply Urgo K2 as directed (alternative to 4 layer compression). WOUND #15: - Lower Leg Wound Laterality: Right, Anterior Cleanser: Soap and Water 1 x Per Week/15 Days Discharge Instructions: May shower and wash wound with dial antibacterial soap and water prior to dressing change. Cleanser: Vashe 5.8 (oz) 1  x Per Week/15 Days Discharge Instructions: Cleanse the wound with Vashe prior to applying a clean dressing using gauze sponges, not tissue or cotton balls. Peri-Wound Care: Sween Lotion (Moisturizing lotion) 1 x Per Week/15 Days Discharge Instructions: Apply moisturizing lotion as directed Prim Dressing: PolyMem Silver Non-Adhesive Dressing, 4.25x4.25 in 1 x Per Week/15 Days ary Discharge Instructions: Apply to wound bed as instructed Secondary Dressing: ABD Pad, 8x10 1 x Per Week/15 Days Discharge Instructions: Apply over primary dressing as directed. [name], [name] ([id number]) [id number] Page  10 of 10 Compression Wrap: Urgo K2, (equivalent to a 4 layer) two layer compression system, regular 1 x Per Week/15 Days Discharge Instructions: Apply Urgo K2 as directed (alternative to 4 layer compression). 1. #1 I have changed the primary dressing to polymen Ag still under the same compression 2. Empiric doxycycline 100 twice daily for 7 days. I have cautioned him about Coumadin and antibiotics although doxycycline is usually fairly safe 3. He has had vascular assessment when he was in the clinic last in 2021 including arterial and venous studies. Electronic Signature(s) Signed: 05/27/2023 10:03:28 AM By: Baltazar Najjar MD Entered By: Baltazar Najjar on 05/26/2023 13:48:41 -------------------------------------------------------------------------------- SuperBill Details Patient Name: Date of Service: [name], [name] 05/26/2023 Medical Record Number: [id number] Patient Account Number: [id number] Date of Birth/Sex: Treating RN: June 28, 1955 (67 y.o. Harlon Flor, Yvonne Kendall Primary Care Provider: Hillard Danker Other Clinician: Referring Provider: Treating Provider/Extender: Shara Blazing in Treatment: 12 Diagnosis Coding ICD-10 Codes Code Description [phone] Non-pressure chronic ulcer of other part of left lower leg with other specified severity I87.332 Chronic venous hypertension (idiopathic) with ulcer and inflammation of left lower extremity L97.328 Non-pressure chronic ulcer of left ankle with other specified severity E11.622 Type 2 diabetes mellitus with other skin ulcer L97.818 Non-pressure chronic ulcer of other part of right lower leg with other specified severity Facility Procedures : CPT4: Code [id number] Description: 81 BILATERAL: Application of multi-layer venous compression system; leg (below knee), including ankle and t. Modifier: Quantity: 1 Physician Procedures : CPT4 Code Description Modifier [id number] - WC PHYS LEVEL 4 - EST  PT ICD-10 Diagnosis Description L97.828 Non-pressure chronic ulcer of other part of left lower leg with other specified severity I87.332 Chronic venous hypertension (idiopathic) with  ulcer and inflammation of left lower extremity L97.328 Non-pressure chronic ulcer of left ankle with other specified severity L97.818 Non-pressure chronic ulcer of other part of right lower leg with other specified severity Quantity: 1 Electronic Signature(s) Signed: 05/27/2023 10:03:28 AM By: Baltazar Najjar MD Entered By: Baltazar Najjar on 05/26/2023 13:49:09

## 2023-05-27 NOTE — Progress Notes (Signed)
[name], [name] ([id number]) [id number] Page 1 of 13 Visit Report for 05/26/2023 Arrival Information Details Patient Name: Date of Service: [name], [name] 05/26/2023 1:00 PM Medical Record Number: [id number] Patient Account Number: [id number] Date of Birth/Sex: Treating RN: 1956-03-23 (67 y.o. [name] Primary Care Aryaa Bunting: Hillard Danker Other Clinician: Referring Yamina Lenis: Treating Sayid Moll/Extender: Shara Blazing in Treatment: 12 Visit Information History Since Last Visit Added or deleted any medications: No Patient Arrived: Ambulatory Any new allergies or adverse reactions: No Arrival Time: 12:55 Had a fall or experienced change in No Accompanied By: self activities of daily living that may affect Transfer Assistance: None risk of falls: Patient Identification Verified: Yes Signs or symptoms of abuse/neglect since last visito No Secondary Verification Process Completed: Yes Hospitalized since last visit: No Patient Requires Transmission-Based Precautions: No Implantable device outside of the clinic excluding No Patient Has Alerts: Yes cellular tissue based products placed in the center Patient Alerts: Patient on Blood Thinner since last visit: Has Dressing in Place as Prescribed: Yes Has Compression in Place as Prescribed: Yes Pain Present Now: Yes Electronic Signature(s) Signed: 05/26/2023 5:09:16 PM By: Shawn Stall RN, BSN Entered By: Shawn Stall on 05/26/2023 10:13:11 -------------------------------------------------------------------------------- Compression Therapy Details Patient Name: Date of Service: [name] Asper NDY J. 05/26/2023 1:00 PM Medical Record Number: [id number] Patient Account Number: [id number] Date of Birth/Sex: Treating RN: [birth year] (67 y.o. [name] Primary Care Kobie Whidby: Hillard Danker Other Clinician: Referring Caylen Kuwahara: Treating Karsten Vaughn/Extender: Shara Blazing in Treatment: 12 Compression Therapy Performed for Wound Assessment: Wound #14 Left,Posterior Achilles Performed By: Clinician Shawn Stall, RN Compression Type: Double Layer Post Procedure Diagnosis Same as Pre-procedure Electronic Signature(s) Signed: 05/26/2023 5:09:16 PM By: Shawn Stall RN, BSN Entered By: Shawn Stall on 05/26/2023 10:38:29 [name] ([id number]) [id number] Page 2 of 13 -------------------------------------------------------------------------------- Compression Therapy Details Patient Name: Date of Service: [name], [name] 05/26/2023 1:00 PM Medical Record Number: [id number] Patient Account Number: [id number] Date of Birth/Sex: Treating RN: 1956-03-03 (67 y.o. [name] Primary Care Mearle Drew: Hillard Danker Other Clinician: Referring Leeona Mccardle: Treating Neidy Guerrieri/Extender: Shara Blazing in Treatment: 12 Compression Therapy Performed for Wound Assessment: Wound #15 Right,Anterior Lower Leg Performed By: Clinician Shawn Stall, RN Compression Type: Double Layer Post Procedure Diagnosis Same as Pre-procedure Electronic Signature(s) Signed: 05/26/2023 5:09:16 PM By: Shawn Stall RN, BSN Entered By: Shawn Stall on 05/26/2023 10:38:29 -------------------------------------------------------------------------------- Encounter Discharge Information Details Patient Name: Date of Service: [name] Alpers, [name] 05/26/2023 1:00 PM Medical Record Number: [id number] Patient Account Number: [id number] Date of Birth/Sex: Treating RN: Sep 15, 1955 (67 y.o. [name] Primary Care Opha Mcghee: Hillard Danker Other Clinician: Referring Virat Prather: Treating Donnamaria Shands/Extender: Shara Blazing in Treatment: 12 Encounter Discharge Information Items Discharge Condition: Stable Ambulatory Status: Ambulatory Discharge Destination:  Home Transportation: Private Auto Accompanied By: self Schedule Follow-up Appointment: Yes Clinical Summary of Care: Electronic Signature(s) Signed: 05/26/2023 5:09:16 PM By: Shawn Stall RN, BSN Entered By: Shawn Stall on 05/26/2023 10:39:27 -------------------------------------------------------------------------------- Lower Extremity Assessment Details Patient Name: Date of Service: [name], [name] 05/26/2023 1:00 PM Medical Record Number: [id number] Patient Account Number: [id number] Date of Birth/Sex: Treating RN: 06-01-1956 (67 y.o. [name] Primary Care Alexis Mizuno: Hillard Danker Other Clinician: Referring Maddison Kilner: Treating Ricci Paff/Extender: Shara Blazing in Treatment: 12 Edema Assessment Assessed: [name]: Yes] Franne Forts: Yes] Edema: [Left: N] [Right: o] [address] [name], [name] ([id number]) [id number] Page 3 of 13 Left: Right: Point of Measurement: 33 cm  From Medial Instep 36.5 cm 36.5 cm Ankle Left: Right: Point of Measurement: 12 cm From Medial Instep 23 cm 21 cm Vascular Assessment Pulses: Dorsalis Pedis Palpable: [Left:Yes] [Right:Yes] Extremity colors, hair growth, and conditions: Extremity Color: [Left:Hyperpigmented] [Right:Hyperpigmented] Hair Growth on Extremity: [Left:No] [Right:No] Temperature of Extremity: [Left:Warm] [Right:Warm] Capillary Refill: [Left:< 3 seconds] [Right:< 3 seconds] Dependent Rubor: [Left:No] [Right:No] Blanched when Elevated: [Left:No Yes] [Right:No No] Toe Nail Assessment Left: Right: Thick: Yes Yes Discolored: Yes Yes Deformed: Yes Yes Improper Length and Hygiene: Yes Yes Electronic Signature(s) Signed: 05/26/2023 5:09:16 PM By: Shawn Stall RN, BSN Entered By: Shawn Stall on 05/26/2023 10:15:02 -------------------------------------------------------------------------------- Multi Wound Chart Details Patient Name: Date of Service: [name] Alpers, [name]  05/26/2023 1:00 PM Medical Record Number: [id number] Patient Account Number: [id number] Date of Birth/Sex: Treating RN: 1955-07-29 (67 y.o. M) Primary Care Sabena Winner: Hillard Danker Other Clinician: Referring Anecia Nusbaum: Treating Dal Blew/Extender: Shara Blazing in Treatment: 12 Vital Signs Height(in): 73 Pulse(bpm): 76 Weight(lbs): 254 Blood Pressure(mmHg): 107/81 Body Mass Index(BMI): 33.5 Temperature(F): 99 Respiratory Rate(breaths/min): 20 [13:Photos:] Left, Posterior Lower Leg Left, Posterior Achilles Right, Anterior Lower Leg Wound Location: Blister Blister Trauma Wounding Event: Venous Leg Ulcer Diabetic Wound/Ulcer of the Lower Abrasion Primary Etiology: Extremity Diabetic Wound/Ulcer of the Lower N/A Diabetic Wound/Ulcer of the Lower Secondary Etiology: Extremity Extremity AHMARI, HOADLEY ([id number]) [id number] Page 4 of 13 Asthma, Sleep Apnea, Arrhythmia, Asthma, Sleep Apnea, Arrhythmia, Asthma, Sleep Apnea, Arrhythmia, Comorbid History: Congestive Heart Failure, Congestive Heart Failure, Congestive Heart Failure, Hypertension, Type II Diabetes Hypertension, Type II Diabetes Hypertension, Type II Diabetes 01/20/2023 04/07/2023 02/25/2023 Date Acquired: 12 6 4  Weeks of Treatment: Healed - Epithelialized Open Open Wound Status: No No No Wound Recurrence: 0x0x0 2x2.3x0.1 2.4x1.7x0.1 Measurements L x W x D (cm) 0 3.613 3.204 A (cm) : rea 0 0.361 0.32 Volume (cm) : 100.00% -1743.40% -172.00% % Reduction in Area: 100.00% -1705.00% -35.60% % Reduction in Volume: Full Thickness Without Exposed Grade 1 Full Thickness Without Exposed Classification: Support Structures Support Structures None Present Medium Medium Exudate A mount: N/A Serosanguineous Serosanguineous Exudate Type: N/A red, brown red, brown Exudate Color: Distinct, outline attached Distinct, outline attached Distinct, outline  attached Wound Margin: None Present (0%) Medium (34-66%) Large (67-100%) Granulation Amount: N/A Red Red Granulation Quality: None Present (0%) Medium (34-66%) Small (1-33%) Necrotic Amount: N/A Eschar Adherent Slough Necrotic Tissue: Fat Layer (Subcutaneous Tissue): Yes Fat Layer (Subcutaneous Tissue): Yes Fat Layer (Subcutaneous Tissue): Yes Exposed Structures: Fascia: No Fascia: No Fascia: No Tendon: No Tendon: No Tendon: No Muscle: No Muscle: No Muscle: No Joint: No Joint: No Joint: No Bone: No Bone: No Bone: No Large (67-100%) None Small (1-33%) Epithelialization: Scarring: Yes Scarring: Yes Excoriation: No Periwound Skin Texture: Induration: No Callus: No Crepitus: No Rash: No Scarring: No No Abnormalities Noted No Abnormalities Noted Maceration: No Periwound Skin Moisture: Dry/Scaly: No Hemosiderin Staining: Yes Hemosiderin Staining: Yes Hemosiderin Staining: Yes Periwound Skin Color: Atrophie Blanche: No Cyanosis: No Ecchymosis: No Erythema: No Mottled: No Pallor: No Rubor: No No Abnormality No Abnormality No Abnormality Temperature: N/A Compression Therapy Compression Therapy Procedures Performed: Wound Number: 16 N/A N/A Photos: N/A N/A Left, Lateral Lower Leg N/A N/A Wound Location: Gradually Appeared N/A N/A Wounding Event: Venous Leg Ulcer N/A N/A Primary Etiology: N/A N/A N/A Secondary Etiology: Asthma, Sleep Apnea, Arrhythmia, N/A N/A Comorbid History: Congestive Heart Failure, Hypertension, Type II Diabetes 04/27/2023 N/A N/A Date Acquired: 4 N/A N/A Weeks of Treatment: Healed - Epithelialized N/A N/A Wound Status:  No N/A N/A Wound Recurrence: 0x0x0 N/A N/A Measurements L x W x D (cm) 0 N/A N/A A (cm) : rea 0 N/A N/A Volume (cm) : 100.00% N/A N/A % Reduction in Area: 100.00% N/A N/A % Reduction in Volume: Full Thickness Without Exposed N/A N/A Classification: Support Structures None Present N/A N/A Exudate A  mount: N/A N/A N/A Exudate Type: N/A N/A N/A Exudate Color: Distinct, outline attached N/A N/A Wound Margin: None Present (0%) N/A N/A Granulation Amount: N/A N/A N/A Granulation Quality: None Present (0%) N/A N/A Necrotic Amount: N/A N/A N/A Necrotic Tissue: Fat Layer (Subcutaneous Tissue): Yes N/A N/A Exposed StructuresBRODRICK, FIELDHOUSE ([id number]) [id number] Page 5 of 13 Fascia: No Tendon: No Muscle: No Joint: No Bone: No Large (67-100%) N/A N/A Epithelialization: No Abnormalities Noted N/A N/A Periwound Skin Texture: No Abnormalities Noted N/A N/A Periwound Skin Moisture: Hemosiderin Staining: Yes N/A N/A Periwound Skin Color: No Abnormality N/A N/A Temperature: N/A N/A N/A Procedures Performed: Treatment Notes Wound #14 (Achilles) Wound Laterality: Left, Posterior Cleanser Soap and Water Discharge Instruction: May shower and wash wound with dial antibacterial soap and water prior to dressing change. Vashe 5.8 (oz) Discharge Instruction: Cleanse the wound with Vashe prior to applying a clean dressing using gauze sponges, not tissue or cotton balls. Peri-Wound Care Sween Lotion (Moisturizing lotion) Discharge Instruction: Apply moisturizing lotion as directed Topical Primary Dressing PolyMem Silver Non-Adhesive Dressing, 4.25x4.25 in Discharge Instruction: Apply to wound bed as instructed Secondary Dressing ABD Pad, 8x10 Discharge Instruction: Apply over primary dressing as directed. Secured With Compression Wrap Urgo K2, (equivalent to a 4 layer) two layer compression system, regular Discharge Instruction: Apply Urgo K2 as directed (alternative to 4 layer compression). Compression Stockings Add-Ons Wound #15 (Lower Leg) Wound Laterality: Right, Anterior Cleanser Soap and Water Discharge Instruction: May shower and wash wound with dial antibacterial soap and water prior to dressing change. Vashe 5.8 (oz) Discharge  Instruction: Cleanse the wound with Vashe prior to applying a clean dressing using gauze sponges, not tissue or cotton balls. Peri-Wound Care Sween Lotion (Moisturizing lotion) Discharge Instruction: Apply moisturizing lotion as directed Topical Primary Dressing PolyMem Silver Non-Adhesive Dressing, 4.25x4.25 in Discharge Instruction: Apply to wound bed as instructed Secondary Dressing ABD Pad, 8x10 Discharge Instruction: Apply over primary dressing as directed. Secured With Compression Wrap Urgo K2, (equivalent to a 4 layer) two layer compression system, regular Discharge Instruction: Apply Urgo K2 as directed (alternative to 4 layer compression). Compression Stockings Add-Ons EWING, PASSARIELLO ([id number]) [phone] Page 6 of 13 Electronic Signature(s) Signed: 05/27/2023 10:03:28 AM By: Baltazar Najjar MD Entered By: Baltazar Najjar on 05/26/2023 10:41:36 -------------------------------------------------------------------------------- Multi-Disciplinary Care Plan Details Patient Name: Date of Service: [name] Asper NDY J. 05/26/2023 1:00 PM Medical Record Number: [id number] Patient Account Number: [id number] Date of Birth/Sex: Treating RN: 1955-10-07 (67 y.o. [name] Primary Care Ritu Gagliardo: Hillard Danker Other Clinician: Referring Madison Albea: Treating Jeanny Rymer/Extender: Shara Blazing in Treatment: 12 Active Inactive Wound/Skin Impairment Nursing Diagnoses: Impaired tissue integrity Knowledge deficit related to ulceration/compromised skin integrity Goals: Patient/caregiver will verbalize understanding of skin care regimen Date Initiated: 03/03/2023 Target Resolution Date: 06/12/2023 Goal Status: Active Ulcer/skin breakdown will have a volume reduction of 30% by week 4 Date Initiated: 03/03/2023 Date Inactivated: 05/05/2023 Target Resolution Date: 04/18/2023 Unmet Reason: see wound Goal Status:  Unmet measurements. Interventions: Assess patient/caregiver ability to obtain necessary supplies Assess patient/caregiver ability to perform ulcer/skin care regimen upon admission and as needed Assess ulceration(s) every visit Provide education on ulcer and  skin care Notes: Electronic Signature(s) Signed: 05/26/2023 5:09:16 PM By: Shawn Stall RN, BSN Entered By: Shawn Stall on 05/26/2023 10:22:23 -------------------------------------------------------------------------------- Pain Assessment Details Patient Name: Date of Service: [name] Alpers, [name] 05/26/2023 1:00 PM Medical Record Number: [id number] Patient Account Number: [id number] Date of Birth/Sex: Treating RN: 08/14/1955 (67 y.o. [name] Primary Care Baudelia Schroepfer: Hillard Danker Other Clinician: Referring Zethan Alfieri: Treating Dewitt [name]/Extender: Shara Blazing in Treatment: 12 Active Problems Location of Pain Severity and Description of Pain Patient Has BOEDY, SADRI ([id number]) [id number] Page 7 of 13 Patient Has Paino Yes Site Locations Rate the pain. Current Pain Level: 5 Pain Management and Medication Current Pain Management: Medication: No Cold Application: No Rest: No Massage: No Activity: No T.E.N.S.: No Heat Application: No Leg drop or elevation: No Is the Current Pain Management Adequate: Adequate How does your wound impact your activities of daily livingo Sleep: No Bathing: No Appetite: No Relationship With Others: No Bladder Continence: No Emotions: No Bowel Continence: No Work: No Toileting: No Drive: No Dressing: No Hobbies: No Psychologist, prison and probation services) Signed: 05/26/2023 5:09:16 PM By: Shawn Stall RN, BSN Entered By: Shawn Stall on 05/26/2023 10:13:25 -------------------------------------------------------------------------------- Patient/Caregiver Education Details Patient Name: Date of Service: Forgy, RA  NDY J. 12/10/2024andnbsp1:00 PM Medical Record Number: [id number] Patient Account Number: [id number] Date of Birth/Gender: Treating RN: 1956/01/01 (67 y.o. [name] Primary Care Physician: Hillard Danker Other Clinician: Referring Physician: Treating Physician/Extender: Shara Blazing in Treatment: 12 Education Assessment Education Provided To: Patient Education Topics Provided Wound/Skin Impairment: Handouts: Caring for Your Ulcer Methods: Explain/Verbal Responses: Reinforcements needed Electronic Signature(s) Signed: 05/26/2023 5:09:16 PM By: Shawn Stall RN, BSN Entered By: Shawn Stall on 05/26/2023 10:22:35 [name] ([id number]) [id number] Page 8 of 13 -------------------------------------------------------------------------------- Wound Assessment Details Patient Name: Date of Service: GADGE, GRGURICH 05/26/2023 1:00 PM Medical Record Number: [id number] Patient Account Number: [id number] Date of Birth/Sex: Treating RN: 1956-02-08 (67 y.o. Harlon Flor, Millard.Loa Primary Care Cleo Villamizar: Hillard Danker Other Clinician: Referring Wilna Pennie: Treating Deannie Resetar/Extender: Shara Blazing in Treatment: 12 Wound Status Wound Number: 13 Primary Venous Leg Ulcer Etiology: Wound Location: Left, Posterior Lower Leg Secondary Diabetic Wound/Ulcer of the Lower Extremity Wounding Event: Blister Etiology: Date Acquired: 01/20/2023 Wound Healed - Epithelialized Weeks Of Treatment: 12 Status: Clustered Wound: No Comorbid Asthma, Sleep Apnea, Arrhythmia, Congestive Heart Failure, History: Hypertension, Type II Diabetes Photos Wound Measurements Length: (cm) Width: (cm) Depth: (cm) Area: (cm) Volume: (cm) 0 % Reduction in Area: 100% 0 % Reduction in Volume: 100% 0 Epithelialization: Large (67-100%) 0 Tunneling: No 0 Undermining: No Wound Description Classification: Full  Thickness Without Exposed Support Structures Wound Margin: Distinct, outline attached Exudate Amount: None Present Foul Odor After Cleansing: No Slough/Fibrino No Wound Bed Granulation Amount: None Present (0%) Exposed Structure Necrotic Amount: None Present (0%) Fascia Exposed: No Fat Layer (Subcutaneous Tissue) Exposed: Yes Tendon Exposed: No Muscle Exposed: No Joint Exposed: No Bone Exposed: No Periwound Skin Texture Texture Color No Abnormalities Noted: No No Abnormalities Noted: No Scarring: Yes Hemosiderin Staining: Yes Moisture Temperature / Pain No Abnormalities Noted: Yes Temperature: No Abnormality Electronic Signature(s) Signed: 05/26/2023 4:57:48 PM By: Zenaida Deed RN, BSN Signed: 05/26/2023 5:09:16 PM By: Shawn Stall RN, BSN Entered By: Zenaida Deed on 05/26/2023 10:18:43 [name] ([id number]) [id number] Page 9 of 13 -------------------------------------------------------------------------------- Wound Assessment Details Patient Name: Date of Service: AUGUSTIN, VISCOMI 05/26/2023 1:00 PM Medical Record Number: [id number] Patient Account Number: [id number] Date of  Birth/Sex: Treating RN: 08-11-1955 (67 y.o. Harlon Flor, Millard.Loa Primary Care Marjory Meints: Hillard Danker Other Clinician: Referring Naphtali Riede: Treating Jakaiya Netherland/Extender: Shara Blazing in Treatment: 12 Wound Status Wound Number: 14 Primary Diabetic Wound/Ulcer of the Lower Extremity Etiology: Wound Location: Left, Posterior Achilles Wound Open Wounding Event: Blister Status: Date Acquired: 04/07/2023 Comorbid Asthma, Sleep Apnea, Arrhythmia, Congestive Heart Failure, Weeks Of Treatment: 6 History: Hypertension, Type II Diabetes Clustered Wound: No Photos Wound Measurements Length: (cm) Width: (cm) Depth: (cm) Area: (cm) Volume: (cm) 2 % Reduction in Area: -1743.4% 2.3 % Reduction in Volume: -1705% 0.1  Epithelialization: None 3.613 Tunneling: No 0.361 Undermining: No Wound Description Classification: Grade 1 Wound Margin: Distinct, outline attached Exudate Amount: Medium Exudate Type: Serosanguineous Exudate Color: red, brown Foul Odor After Cleansing: No Slough/Fibrino Yes Wound Bed Granulation Amount: Medium (34-66%) Exposed Structure Granulation Quality: Red Fascia Exposed: No Necrotic Amount: Medium (34-66%) Fat Layer (Subcutaneous Tissue) Exposed: Yes Necrotic Quality: Eschar Tendon Exposed: No Muscle Exposed: No Joint Exposed: No Bone Exposed: No Periwound Skin Texture Texture Color No Abnormalities Noted: No No Abnormalities Noted: No Scarring: Yes Hemosiderin Staining: Yes Moisture Temperature / Pain No Abnormalities Noted: Yes Temperature: No Abnormality Treatment Notes Wound #14 (Achilles) Wound Laterality: Left, Posterior PUNEET, DORNBUSCH ([id number]) [id number] Page 10 of 13 Cleanser Soap and Water Discharge Instruction: May shower and wash wound with dial antibacterial soap and water prior to dressing change. Vashe 5.8 (oz) Discharge Instruction: Cleanse the wound with Vashe prior to applying a clean dressing using gauze sponges, not tissue or cotton balls. Peri-Wound Care Sween Lotion (Moisturizing lotion) Discharge Instruction: Apply moisturizing lotion as directed Topical Primary Dressing PolyMem Silver Non-Adhesive Dressing, 4.25x4.25 in Discharge Instruction: Apply to wound bed as instructed Secondary Dressing ABD Pad, 8x10 Discharge Instruction: Apply over primary dressing as directed. Secured With Compression Wrap Urgo K2, (equivalent to a 4 layer) two layer compression system, regular Discharge Instruction: Apply Urgo K2 as directed (alternative to 4 layer compression). Compression Stockings Add-Ons Electronic Signature(s) Signed: 05/26/2023 4:57:48 PM By: Zenaida Deed RN, BSN Signed: 05/26/2023 5:09:16 PM  By: Shawn Stall RN, BSN Entered By: Zenaida Deed on 05/26/2023 10:20:25 -------------------------------------------------------------------------------- Wound Assessment Details Patient Name: Date of Service: [name] Asper NDY J. 05/26/2023 1:00 PM Medical Record Number: [id number] Patient Account Number: [id number] Date of Birth/Sex: Treating RN: 06/28/1955 (67 y.o. [name] Primary Care Frederic Tones: Hillard Danker Other Clinician: Referring Izaac Reisig: Treating Orion Mole/Extender: Shara Blazing in Treatment: 12 Wound Status Wound Number: 15 Primary Abrasion Etiology: Wound Location: Right, Anterior Lower Leg Secondary Diabetic Wound/Ulcer of the Lower Extremity Wounding Event: Trauma Etiology: Date Acquired: 02/25/2023 Wound Open Weeks Of Treatment: 4 Status: Clustered Wound: No Comorbid Asthma, Sleep Apnea, Arrhythmia, Congestive Heart Failure, History: Hypertension, Type II Diabetes Photos KHARY, SIELSKI ([id number]) [id number] Page 11 of 13 Wound Measurements Length: (cm) 2.4 Width: (cm) 1.7 Depth: (cm) 0.1 Area: (cm) 3.204 Volume: (cm) 0.32 % Reduction in Area: -172% % Reduction in Volume: -35.6% Epithelialization: Small (1-33%) Tunneling: No Undermining: No Wound Description Classification: Full Thickness Without Exposed Support Structures Wound Margin: Distinct, outline attached Exudate Amount: Medium Exudate Type: Serosanguineous Exudate Color: red, brown Foul Odor After Cleansing: No Slough/Fibrino Yes Wound Bed Granulation Amount: Large (67-100%) Exposed Structure Granulation Quality: Red Fascia Exposed: No Necrotic Amount: Small (1-33%) Fat Layer (Subcutaneous Tissue) Exposed: Yes Necrotic Quality: Adherent Slough Tendon Exposed: No Muscle Exposed: No Joint Exposed: No Bone Exposed: No Periwound Skin Texture Texture Color No Abnormalities Noted: Yes No  Abnormalities Noted:  No Atrophie Blanche: No Moisture Cyanosis: No No Abnormalities Noted: Yes Ecchymosis: No Erythema: No Hemosiderin Staining: Yes Mottled: No Pallor: No Rubor: No Temperature / Pain Temperature: No Abnormality Treatment Notes Wound #15 (Lower Leg) Wound Laterality: Right, Anterior Cleanser Soap and Water Discharge Instruction: May shower and wash wound with dial antibacterial soap and water prior to dressing change. Vashe 5.8 (oz) Discharge Instruction: Cleanse the wound with Vashe prior to applying a clean dressing using gauze sponges, not tissue or cotton balls. Peri-Wound Care Sween Lotion (Moisturizing lotion) Discharge Instruction: Apply moisturizing lotion as directed Topical Primary Dressing PolyMem Silver Non-Adhesive Dressing, 4.25x4.25 in Discharge Instruction: Apply to wound bed as instructed Secondary Dressing ABD Pad, 8x10 Discharge Instruction: Apply over primary dressing as directed. Secured With Compression Wrap Urgo K2, (equivalent to a 4 layer) two layer compression system, regular Discharge Instruction: Apply Urgo K2 as directed (alternative to 4 layer compression). Compression Stockings Add-Ons Electronic Signature(s) Signed: 05/26/2023 4:57:48 PM By: Zenaida Deed RN, BSN Signed: 05/26/2023 5:09:16 PM By: Shawn Stall RN, BSN [place], Dalene Seltzer ([id number]) PM By: Shawn Stall RN, BSN [phone].pdf Page 12 of 13 Signed: 05/26/2023 5:09:16 Entered By: Zenaida Deed on 05/26/2023 10:20:03 -------------------------------------------------------------------------------- Wound Assessment Details Patient Name: Date of Service: ANDRU, CLOONAN 05/26/2023 1:00 PM Medical Record Number: [id number] Patient Account Number: [id number] Date of Birth/Sex: Treating RN: February 04, 1956 (67 y.o. Harlon Flor, Yvonne Kendall Primary Care Huy Majid: Hillard Danker Other Clinician: Referring Kemoni Quesenberry: Treating Knox Holdman/Extender: Shara Blazing in Treatment: 12 Wound Status Wound Number: [id number] Primary Venous Leg Ulcer Etiology: Wound Location: Left, Lateral Lower Leg Wound Healed - Epithelialized Wounding Event: Gradually Appeared Status: Date Acquired: 04/27/2023 Comorbid Asthma, Sleep Apnea, Arrhythmia, Congestive Heart Failure, Weeks Of Treatment: 4 History: Hypertension, Type II Diabetes Clustered Wound: No Photos Wound Measurements Length: (cm) Width: (cm) Depth: (cm) Area: (cm) Volume: (cm) 0 % Reduction in Area: 100% 0 % Reduction in Volume: 100% 0 Epithelialization: Large (67-100%) 0 Tunneling: No 0 Undermining: No Wound Description Classification: Full Thickness Without Exposed Support Structures Wound Margin: Distinct, outline attached Exudate Amount: None Present Foul Odor After Cleansing: No Slough/Fibrino No Wound Bed Granulation Amount: None Present (0%) Exposed Structure Necrotic Amount: None Present (0%) Fascia Exposed: No Fat Layer (Subcutaneous Tissue) Exposed: Yes Tendon Exposed: No Muscle Exposed: No Joint Exposed: No Bone Exposed: No Periwound Skin Texture Texture Color No Abnormalities Noted: Yes No Abnormalities Noted: No Hemosiderin Staining: Yes Moisture No Abnormalities Noted: Yes Temperature / Pain Temperature: No Abnormality Electronic Signature(s) Signed: 05/26/2023 4:57:48 PM By: Zenaida Deed RN, BSN Signed: 05/26/2023 5:09:16 PM By: Shawn Stall RN, BSN [place], Dalene Seltzer ([id number]) [id number] Page 13 of 13 Entered By: Zenaida Deed on 05/26/2023 10:17:49 -------------------------------------------------------------------------------- Vitals Details Patient Name: Date of Service: VERONICA, GILHOOLEY 05/26/2023 1:00 PM Medical Record Number: [id number] Patient Account Number: [id number] Date of Birth/Sex: Treating RN: 07/29/1955 (67 y.o. [name] Primary Care Lateisha Thurlow: Hillard Danker Other  Clinician: Referring Monzerrath Mcburney: Treating Niyanna Asch/Extender: Shara Blazing in Treatment: 12 Vital Signs Time Taken: 13:10 Temperature (F): 99 Height (in): 73 Pulse (bpm): 76 Weight (lbs): 254 Respiratory Rate (breaths/min): 20 Body Mass Index (BMI): 33.5 Blood Pressure (mmHg): 107/81 Reference Range: 80 - 120 mg / dl Electronic Signature(s) Signed: 05/26/2023 5:09:16 PM By: Shawn Stall RN, BSN Entered By: Shawn Stall on 05/26/2023 10:13:41

## 2023-06-02 ENCOUNTER — Encounter (HOSPITAL_BASED_OUTPATIENT_CLINIC_OR_DEPARTMENT_OTHER): Payer: Medicare HMO | Admitting: Internal Medicine

## 2023-06-02 ENCOUNTER — Encounter: Payer: Self-pay | Admitting: Internal Medicine

## 2023-06-02 DIAGNOSIS — L97818 Non-pressure chronic ulcer of other part of right lower leg with other specified severity: Secondary | ICD-10-CM | POA: Diagnosis not present

## 2023-06-02 DIAGNOSIS — I480 Paroxysmal atrial fibrillation: Secondary | ICD-10-CM | POA: Diagnosis not present

## 2023-06-02 DIAGNOSIS — E11622 Type 2 diabetes mellitus with other skin ulcer: Secondary | ICD-10-CM | POA: Diagnosis not present

## 2023-06-02 DIAGNOSIS — I428 Other cardiomyopathies: Secondary | ICD-10-CM | POA: Diagnosis not present

## 2023-06-02 DIAGNOSIS — I87332 Chronic venous hypertension (idiopathic) with ulcer and inflammation of left lower extremity: Secondary | ICD-10-CM | POA: Diagnosis not present

## 2023-06-02 DIAGNOSIS — I110 Hypertensive heart disease with heart failure: Secondary | ICD-10-CM | POA: Diagnosis not present

## 2023-06-02 DIAGNOSIS — L97828 Non-pressure chronic ulcer of other part of left lower leg with other specified severity: Secondary | ICD-10-CM | POA: Diagnosis not present

## 2023-06-02 DIAGNOSIS — I5022 Chronic systolic (congestive) heart failure: Secondary | ICD-10-CM | POA: Diagnosis not present

## 2023-06-02 DIAGNOSIS — L97222 Non-pressure chronic ulcer of left calf with fat layer exposed: Secondary | ICD-10-CM | POA: Diagnosis not present

## 2023-06-02 DIAGNOSIS — L97328 Non-pressure chronic ulcer of left ankle with other specified severity: Secondary | ICD-10-CM | POA: Diagnosis not present

## 2023-06-02 NOTE — Telephone Encounter (Signed)
 Care team updated and letter sent for eye exam notes.

## 2023-06-03 ENCOUNTER — Encounter: Payer: Self-pay | Admitting: Internal Medicine

## 2023-06-03 NOTE — Progress Notes (Signed)
VASHAWN, [name] ([id number]) [id number] Page 1 of 11 Visit Report for 06/02/2023 Debridement Details Patient Name: Date of Service: [name], [name] 06/02/2023 2:30 PM Medical Record Number: [id number] Patient Account Number: [id number] Date of Birth/Sex: Treating [name]: 1955-08-17 (68 y.o. M) Primary Care Provider: Hillard Danker Other Clinician: Referring Provider: Treating Provider/Extender: Shara Blazing in Treatment: 13 Debridement Performed for Assessment: Wound #14 Left,Posterior Achilles Performed By: Physician [name] Caul., [name] The following information was scribed by: [name] Mu The information was scribed for: [name] Najjar Debridement Type: Debridement Severity of Tissue Pre Debridement: Fat layer exposed Level of Consciousness (Pre-procedure): Awake and Alert Pre-procedure Verification/Time Out Yes - 14:48 Taken: Start Time: 14:48 Pain Control: Lidocaine Percent of Wound Bed Debrided: 100% T Area Debrided (cm): otal 4.4 Tissue and other material debrided: Viable, Non-Viable, Slough, Subcutaneous, Slough Level: Skin/Subcutaneous Tissue Debridement Description: Excisional Instrument: Curette Bleeding: Minimum Hemostasis Achieved: Pressure End Time: 14:48 Procedural Pain: 0 Post Procedural Pain: 0 Response to Treatment: Procedure was tolerated well Level of Consciousness (Post- Awake and Alert procedure): Post Debridement Measurements of Total Wound Length: (cm) 2 Width: (cm) 2.8 Depth: (cm) 0.1 Volume: (cm) 0.44 Character of Wound/Ulcer Post Debridement: Improved Severity of Tissue Post Debridement: Fat layer exposed Post Procedure Diagnosis Same as Pre-procedure Electronic Signature(s) Signed: 06/02/2023 4:32:13 PM By: [name] Entered By: [name] Najjar on 06/02/2023 13:16:04 -------------------------------------------------------------------------------- HPI  Details Patient Name: Date of Service: [name] Asper NDY J. 06/02/2023 2:30 PM Medical Record Number: [id number] Patient Account Number: [id number] Date of Birth/Sex: Treating [name]: Apr 22, 1956 (67 y.o. M) Primary Care Provider: Hillard Danker Other Clinician: Referring Provider: Treating Provider/Extender: Shara Blazing in Treatment: [address], [place] J ([id number]) [id number] Page 2 of 11 History of Present Illness HPI Description: ADMISSION 07/29/2018 [name] is a 67 year old man with either prediabetes or diabetes he is on glipizide. In late October to November 2019 he noted a scabbed area on the back of his right calf. He picked this off a few times but it would not heal. He saw his primary physician on 12/5. It was felt at that time that this may actually heal on its own with conservative management. The next visit was on 07/20/2018 noted the area was a lot worse and arranged for his treatment here. He has been topical antibiotics like Neosporin although he stopped using this when the wound looked worse. He is just been covering this with a clean Band-Aid. He is given Bactrim a week ago and he is finishing this currently. It is made some improvement in the surrounding erythema per the patient. He does not have a history of nonhealing wounds. No prior history of wounds on his legs that he had difficulty healing. No prior skin issues. 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 Past medical history; A. fib status post ablation and recent cardioversion he is on Xarelto. He has a history of systolic heart failure, cervical radiculopathy, cardiomyopathy and type 2 diabetes as discussed ABI in the right leg was noncompressible on the right 2/20; the biopsy I did on the patient last week was negative for malignancy. Culture grew Pseudomonas and methicillin sensitive staph aureus. He is on cefdinir 300 twice a day. I will have  to make this a 10 day course. He put him in compression. He states that the redness pain and erythema are a lot better. I suspect the patient has chronic venous insufficiency probably with a secondary cellulitis 2/27; arrives  today with copious amounts of drainage coming out of the wound irritating the skin below the wound area. He only renewed the final 3 days of cefdinir today 3/5; came in for a nurse change 3 days ago. Again a lot of drainage noted. Zinc oxide was applied unfortunately the drainage appears to a pool then he has a string of superficial open areas extending down into the Achilles area and some just below the wound. The wound itself does not look too bad. He is completed the antibiotics although apparently there was separation from the original 7 with the last 3 days. Culture grew a few MSSA and a few Pseudomonas 3/12; not too much difference over the last week. He came in for a dressing change on Monday by our nursing staff. Necrotic debris again over the wound surface. The entire area looks irritated but nontender and I do not think shows obvious evidence of infection. We have not heard anything about the reflux studies. Also notable than in this diabetic man we had noncompressible vessels and although I can feel his pulses easily in his feet I will order arterial studies as well. 3/19-Patient had experience more pain has been dressed with a silver alginate, the wounds all have necrotic debris, very friable with easy bleeding with any kind of surface debridement including with gauze and Anasept and with a #3 curette. His vascular appointments unfortunately were all canceled on account of the virus outbreak resulting in studies only being done for emergent cases. His pulses are easily palpable in the lower extremity. He has open areas below the primary wound where he states the drainage which included purulent material also with some blood pooled and caused breakdown. 3/26; the  patient was changed to Santyl with [facility] backing last week. This was largely because the silver alginate was sticking to the wound. He is still having quite a bit of pain. He tells [place] that the reflux studies and arterial studies we had attempted to arrange through vein and vascular are not being booked until mid May. Culture that was done last week showed both Serratia moderate and a few methicillin sensitive staph aureus I started him on Bactrim DS 1 p.o. twice daily on 3/23 for 7 days. He is here for follow-up. 4/3; patient is on Santyl with Hydrofera Blue. Patient complains of pain. Our intake nurse is noted drainage and some odor. He has completed Bactrim recently for Serratia and a few methicillin sensitive staph aureus. After our staff push hard last week we were able to get venous studies as well as arterial studies. His arterial studies showed on the right great toe pressure of 0.86. On the left ABI at 1.47 TBI of 0.87. On both sides waveforms were triphasic VENOUS STUDIES showed reflux in the femoral vein, popliteal vein great saphenous vein at the saphenofemoral junction and great saphenous vein at the proximal thigh. Also noted to have age-indeterminate superficial vein thrombosis involving the small saphenous vein. Notable that the patient is already on Xarelto for atrial fibrillation. 4/9; the patient has been to see vascular surgery and reviewed by Dr. Durwin Nora. He was felt to have only minimal superficial venous reflux in the right great greater saphenous vein. For this reason he was not felt to be a candidate for laser ablation of the right greater saphenous vein. A wedge cushion was suggested to keep his leg elevated. He does have deep venous reflux on the right. Culture I did last time showed a combination of Serratia Pseudomonas  and methicillin sensitive staph aureus. We have been using Hydrofera Blue to the large wound, Santyl to some of the deep punched out satellite lesions  distal to it. There is some improvement 4/16; the application for Apligraf was put out for further review for material that we have resubmitted. He has the deep area on the right posterior calf which is large and then 3 small punched out areas beneath this. In general his wounds look a lot better 4/23; the patient has his large original wound and then 2 punched out satellite lesions below it on the right posterior calf. I was usually able to use Apligraf #1 to cover the full surface area of the larger wound. We continued with Santyl and Hydrofera Blue on the 2 punched-out areas 5/7; patient has his large original wound and 2 punched out satellite lesions below it in the right posterior calf. Apligraf #2 today. Major improvement in the big wound in terms of wound depth. Punched-out [place] wounds have a very healthy looking wound bed. 5/21-Patient comes back for his large right posterior calf wound for which she has been an Apligraf #3 today. Wound depth seems to be better, the punched-out wounds appear to have healthy bed with some bleeding 6/4; right posterior calf wound is much better. Apligraf #4 today. The major wound has no wound depth at all. Only 1 of the satellite lesions is still open. The patient has very high blood pressure coming in today with a diastolic blood pressure of 130 after lying there for a few minutes it was down to 110. He states that is running 100 210 diastolic at home. He was high last week as well I thought he was on 50 mg 1/2 tablet twice daily of losartan I told him to double up on that however it turns out he is actually on 50 twice daily. Course he is run out of his medications prematurely. He has a follow-up with his primary's office next Wednesday. 6/18; patient's wounds continue to improve. The small area laterally is just about closed and there is continued epithelialization on the area on the posterior calf. 7/2; we applied his last Apligraf last visit. Early the  next week there was a lot of purulent looking drainage that I cultured this grew staph and Pseudomonas however when we had him back for the next visit to 3 days later everything looked a lot better. He did not receive systemic antibiotics. Fortunately we did not have to remove the Apligraf. He has had heart trouble this week he was having an ablation apparently they have discovered a clot in his left atrium they have changed all his anticoagulants as well as his blood pressure medication. 7/16; using Hydrofera Blue. We are making nice progress towards closure. He has not yet ordered his compression stockings he has his measurements 7/23; dimensions are better. He has a new satellite area superiorly. Both wounds cauterized with silver nitrate. 7/30; absolutely no change in the major wound on the posterior part of the right calf. We have been using Hydrofera Blue for a prolonged period of time and really had a nice improvement but over the last few weeks this is stalled. There is no depth. He arrived in clinic today with a new area on the right anterior tibia which apparently was an "ingrown hair". It is clearly an open area. This looks like a wrap injury although he was not really aware of it. 8/31; since the patient was last in clinic he was admitted  to hospital from 8/17 through 8/22. He had had multiple falls at home including one off a ladder. He was admitted to hospital with mild COVID-19 viral pneumonia. Noted to be hyperkalemic and in acute renal failure. He has chronic systolic heart failure with ejection fraction of 35%. He arrives back in clinic with the original wound on the posterior aspect of the right lower calf looking a lot better. He has eschared areas from falls and lacerations on his anterior knees bilaterally just below the patella and on the mid part of his tibia bilaterally. He seems to have a small area on the right lateral ankle as well [name], [name] ([id number])  [id number] Page 3 of 11 9/10- Patient comes to clinic with a 2 new wounds one on the left anterior shin and one on the right posterior leg both the left knee and right knee areas are healed up. We have been using Santyl to the left anterior leg and silver alginate to right posterior leg wounds. 9/17; the patient is original wound looks good on the posterior right calf. He has traumatic areas on the left anterior shin left anterior patellar tendon and the right mid tibia area. Except for the right posterior calf all required debridement. We have been using Santyl on these areas and silver alginate posteriorly right calf 9/24; the original wound on the posterior calf on the right looks good. This is healthy. There are traumatic wounds in the left anterior shin, left anterior patella in the right mid tibia area are about the same. He has dusky erythema on the left anterior tibia which led me to give him antibiotics last week. This does not look too much different. This is nontender and I wonder if this is all just venous inflammation. 10/2; the patient has 2 open areas on the right posterior calf both of these look good we have been using silver alginate. He has traumatic wounds on the left and right mid tibia areas. The surface of these wounds is still not ready for closure. We have been using Iodoflex. Finally has areas on the left knee. The latter wounds are all traumatic after a fall 10/9. His original wounds of the right posterior calf are superficial and progressing towards closure. His traumatic wounds on the left anterior tibia and right anterior tibia are considerably improved in terms of the wound surface. He asked me about a skin graft in these areas I do not think this is going to be necessary. Change from Iodosorb to [facility]. The area on the left knee is just about closed as well 10/16; his original wound on the right posterior calf very superficial looks  healthy. He has more recent traumatic wounds on the left anterior tibia and right anterior tibia both of these have better looking surfaces and measuring smaller. Might be beneficial for an advanced treatment product. The area on his left knee has a small superficial scab and we are going to close that when 10/23; his original wound areas on the right posterior calf continue to improve. The more recently traumatic wounds on the left anterior tibia and right anterior tibia both look better in terms of surfaces. No debridement required. We have been using Hydrofera Blue. He is approved for Apligraf 11/3; Apligraf #1 applied to the left anterior tibia right anterior tibia and to 2 small areas remaining on the right posterior calf which were part of his initial wound area. 11/19; Apligraf #2. Left anterior tibia is healed. Right anterior tibia  much better. Only one small area remains on the right posterior calf which was part of his initial wound area 12/3; the left anterior tibia has opened up again. Hyper granulated nodule. Right anterior tibia is superficial and larger. There is no depth of this. I did not place Apligraf today return to [facility] under compression 12/10; the patient has 2 remaining wounds 1 on the right posterior and the other on the left anterior. Both of these look quite good. No debridement is required we have been using Hydrofera Blue under compression. 12/17; the patient has really not closed over which is disappointing. His wraps were very tight and there may be some wrap injury issues. On the left he has the original wound on the medial mid tibia he has a wrap injury on the lateral mid tibia. On the right his posterior areas are superficial but certainly not closed 12/31; the patient has totally closed on the left. He will transition to his own 20/30 below-knee stocking on the left leg today. Interestingly on the right posterior calf the 2 wounds that he had from last time  of closed however there is an additional injury in this same area. Almost looks as though there is subcutaneous fluid/blistering. There is no evidence of cellulitis this does not seem tender 06/24/2019; the patient comes in having been placed in his 20/30 stockings on the left leg last week. He states that he noted blisters break open on Saturday and they had opened into wounds on the anterior tibial area on the left by Monday. The area on the posterior right leg looks satisfactory we are still doing that and compression. We are using calcium alginate to all wound areas 1/14; the patient had juxta light however one of them was not the right size which will need to be returned. The new area last week on the left anterior tibia looks a lot better it measures smaller I would say the area on the right is about the same, this is on the right posterior calf. We have been using silver alginate 1/21; he has his bilateral juxta lites however the left anterior wound continues to look better where is the 2 areas on the right posterior calf look about the same. We have been using Hydrofera Blue under compression with the juxta lite stockings now on standby 2/4; the areas on the left are totally healed and he will be discharged into his own stocking/juxta light. On the right posterior calf he has what looks to be a blister with a small area of subdermal hemorrhage this is very tiny but I did I am going to wrap him today and see if this will be reabsorbed by next week 2/11; he comes in this week with expanded wounds on the posterior calf as well as a new wound on the left anterior. He said the left anterior started a few days ago with a blister that is now surface. He is using his juxta lite stockings at a pressure of 30/40 2/19; we wrapped his left leg last week after a failed attempt to discharge that leg and his juxta lites 2 weeks ago. The leg is again healed he has superficial areas posteriorly on the right  leg 2/26; the patient came in initially with right posterior calf wound secondary to minor trauma with secondary infection. He went on to develop a left lower leg wound. In the interim he had congestive heart failure with acute renal failure a fall with bilateral anterior leg  wounds and finally Covid requiring admission to pneumonia with pneumonia. He is finally closed. He has external compression stockings bilaterally [juxta lite stockings]. His wounds are healed READMISSION 03/03/2023 This is a patient that we had here for a year from 20 20 through 20 21 with a sizable wound on the right posterior calf which was secondary to a combination of chronic venous insufficiency and secondary infection in the wound when he came here. This was a problematic wound in itself however he developed traumatic wounds on his lower extremities after a fall from a ladder during the same admission. Eventually he was healed out and discharged and juxta lite stockings. He tells me that everything was fine up until about 2-1/2 months ago he developed a blister and an open area on the left posterior calf this time. He saw his primary doctor and received a course of Bactrim. He has been using Silvadene with AandE ointment and covering this with gauze. He is here for review of this area. When he was here in 2021 he also saw Dr. Woodfin Ganja of vein and vascular he had venous reflux studies that showed deep venous reflux but nothing that was amenable to surgery. He also had arterial studies. He was noncompressible on the left but had normal TBIs and triphasic waveforms. In terms of the reflux studies I am not really sure whether the left leg was involved in that study or not we will need to review that. His past medical history includes type type 2 diabetes, chronic venous insufficiency with secondary lymphedema, intermittent A-fib, nonischemic cardiomyopathy, thrombus of the left atrial appendage, hereditary hemochromatosis  cytosis. He is now on Coumadin presumably for the atrial fibrillation. ABIs in this clinic were not repeated 9/24; posterior left calf wound. We used Hydrofera Blue under compression. The wound looks better this week better looking surface. 10/1. Posterior left calf wound secondary to venous insufficiency. He was new to the clinic in 2 weeks ago at which time I thought he was going to require a difficult set of mechanical debridements however the wound surface is continuing to improve using Hydrofera Blue although the dimensions not so remarkably improved today 10/8; posterior left calf wound secondary to chronic venous insufficiency. He continues to think nice progress here on this wound the wound is smaller better looking surface. He arrived in clinic today with an abrasion on his Achilles area on the left leg as well as what looked to be a potential deep tissue injury on the lateral lateral part of the left foot at the level of the base of the fifth metatarsal. 10/15 posterior left calf wound continues to improve. We have been using Prisma under ABDs under Urgo K2 lite's. The areas on the Achilles and the left lateral foot which were concerning last time of maintain skin integrity. He is not wearing stockings even on the right leg I talked to him about this. 10/22; posterior left calf wound measures slightly larger today. The abrasion injury in the Achilles send on the lateral fifth metatarsal base look improved. We have been using Prisma under compression I am going to change this to [facility] today 10/29; posterior left calf wound continues to progress nicely. Presumably an abrasion injury on the Achilles heel. I changed dressing to Hydrofera Blue. Will need to dress the area on the Achilles today. The area on the fifth metatarsal base is not open 11/5; posterior left calf wound continues to improve nicely presumably an abrasion injury on the left Achilles  also looks clean. He has a what  looks to be an [name], [name] ([id number]) [id number] Page 4 of 11 abrasion on the base of the fifth metatarsal it is not open but it looks as though there is been some friction on this area we will pad this today under the wrap. Patient with chronic venous insufficiency severe hemosiderin deposition and resultant wounds. He has been in the clinic before however that was for a wound on the right posterior calf. Notable he is not wearing stockings even on the uninvolved right leg today 11/11; his original wound on the posterior left calf continues to improve in dimensions. Once again he comes in today with some black eschar around the edge I wonder if this is bleeding. He has the area on the Achilles and now a new area on the left upper lower leg which he says he scratched. And finally he has an area on the right anterior mid tibia which he said he got from taking off his stocking 11/19; he has wounds on his bilateral lower legs. We have been wrapping with Urgo K2's. He has original wound on the left posterior calf, probably a wrap injury on the left lateral calf, area on the left Achilles, and finally the right medial lower leg. We have been using Hydrofera Blue 12/3; came in this with.a wound on his left posterior leg. since then her has developed a more distal left wound and a right anterior. A wound of the left lateral has healed. We are using HFB with Urgo K2 12/10; the patient initially came in with a wound on his left posterior leg. This is healed today. He subsequently developed an area on the left Achilles heel and an area on the right anterior mid tibia area. We have been using Hydrofera Blue however intake reports that these are getting stuck to the wound. Also he has an area of painful erythema around the posterior left Achilles area. This could be rubbing in the wrap however I think coverage with antibiotics for cellulitis is certainly indicated we will be  changing the primary dressing to polymen silver 12/17; his left posterior leg wound which was his original wound has remained closed. However he subsequently developed an area on the left Achilles heel and the right anterior mid tibia. Last week the area on the Achilles heel had surrounding erythema I gave him a course of doxycycline and this seems to have resolved at least in terms of the erythema. He still however having discomfort in this wound. The area on the right anterior mid tibia looks really quite healthy. We have been polymen silver. Electronic Signature(s) Signed: 06/02/2023 4:32:13 PM By: [name] Entered By: [name] Najjar on 06/02/2023 13:17:18 -------------------------------------------------------------------------------- Physical Exam Details Patient Name: Date of Service: [name] Asper NDY J. 06/02/2023 2:30 PM Medical Record Number: [id number] Patient Account Number: [id number] Date of Birth/Sex: Treating [name]: Feb 11, 1956 (67 y.o. M) Primary Care Provider: Hillard Danker Other Clinician: Referring Provider: Treating Provider/Extender: Shara Blazing in Treatment: 13 Constitutional Sitting or standing Blood Pressure is within target range for patient.. Pulse regular and within target range for patient.[name] Respirations regular, non-labored and within target range.. Temperature is normal and within the target range for the patient.[name] Appears in no distress. Cardiovascular Severe venous hypertension but controlled edema. Notes Wound exam; the left posterior calf which was his original wound is healed Painful wound on the left Achilles required some debridement today because of adherent surface slough. Hemostasis with  direct pressure. He bleeds freely because of use of Coumadin. The area on the right anterior lower leg really looks quite healthy no debridement was required here. Electronic Signature(s) Signed: 06/02/2023 4:32:13 PM By:  [name] Entered By: [name] Najjar on 06/02/2023 13:20:34 -------------------------------------------------------------------------------- Physician Orders Details Patient Name: Date of Service: [name] Asper NDY J. 06/02/2023 2:30 PM Medical Record Number: [id number] Patient Account Number: [id number] Date of Birth/Sex: Treating [name]: 1956/02/20 (67 y.o. [name], [name] Primary Care Provider: Hillard Danker Other Clinician: Referring Provider: Treating Provider/Extender: Eiden, Blackard, Dalene Seltzer ([id number]) [id number] Page 5 of 11 Weeks in Treatment: 13 Verbal / Phone Orders: No Diagnosis Coding Follow-up Appointments ppointment in 1 week. - Dr. Leanord Hawking Return A Anesthetic (In clinic) Topical Lidocaine 4% applied to wound bed Bathing/ Shower/ Hygiene May shower with protection but do not get wound dressing(s) wet. Protect dressing(s) with water repellant cover (for example, large plastic bag) or a cast cover and may then take shower. Edema Control - Orders / Instructions Elevate legs to the level of the heart or above for 30 minutes daily and/or when sitting for 3-4 times a day throughout the day. Avoid standing for long periods of time. Exercise regularly Wound Treatment Wound #14 - Achilles Wound Laterality: Left, Posterior Cleanser: Soap and Water 1 x Per Week/15 Days Discharge Instructions: May shower and wash wound with dial antibacterial soap and water prior to dressing change. Cleanser: Vashe 5.8 (oz) 1 x Per Week/15 Days Discharge Instructions: Cleanse the wound with Vashe prior to applying a clean dressing using gauze sponges, not tissue or cotton balls. Peri-Wound Care: Sween Lotion (Moisturizing lotion) 1 x Per Week/15 Days Discharge Instructions: Apply moisturizing lotion as directed Prim Dressing: PolyMem Silver Non-Adhesive Dressing, 4.25x4.25 in 1 x Per Week/15 Days ary Discharge  Instructions: Apply to wound bed as instructed Secondary Dressing: ABD Pad, 8x10 1 x Per Week/15 Days Discharge Instructions: Apply over primary dressing as directed. Compression Wrap: Urgo K2, (equivalent to a 4 layer) two layer compression system, regular 1 x Per Week/15 Days Discharge Instructions: Apply Urgo K2 as directed (alternative to 4 layer compression). Wound #15 - Lower Leg Wound Laterality: Right, Anterior Cleanser: Soap and Water 1 x Per Week/15 Days Discharge Instructions: May shower and wash wound with dial antibacterial soap and water prior to dressing change. Cleanser: Vashe 5.8 (oz) 1 x Per Week/15 Days Discharge Instructions: Cleanse the wound with Vashe prior to applying a clean dressing using gauze sponges, not tissue or cotton balls. Peri-Wound Care: Sween Lotion (Moisturizing lotion) 1 x Per Week/15 Days Discharge Instructions: Apply moisturizing lotion as directed Prim Dressing: PolyMem Silver Non-Adhesive Dressing, 4.25x4.25 in 1 x Per Week/15 Days ary Discharge Instructions: Apply to wound bed as instructed Secondary Dressing: ABD Pad, 8x10 1 x Per Week/15 Days Discharge Instructions: Apply over primary dressing as directed. Compression Wrap: Urgo K2, (equivalent to a 4 layer) two layer compression system, regular 1 x Per Week/15 Days Discharge Instructions: Apply Urgo K2 as directed (alternative to 4 layer compression). Electronic Signature(s) Signed: 06/02/2023 4:32:11 PM By: [name] Signed: 06/02/2023 4:32:13 PM By: [name] Entered By: [name] Mu on 06/02/2023 12:06:20 Problem List Details -------------------------------------------------------------------------------- [name] ([id number]) [id number] Page 6 of 11 Patient Name: Date of Service: [name], [name] 06/02/2023 2:30 PM Medical Record Number: [id number] Patient Account Number: [id number] Date of Birth/Sex: Treating [name]: 1956/04/14  (67 y.o. 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 with other specified severity 04/14/2023 No Yes L97.818 Non-pressure chronic ulcer of other part of right lower leg with other specified 04/27/2023 No Yes severity E11.622 Type 2 diabetes mellitus with other skin ulcer 03/03/2023 No Yes Inactive Problems ICD-10 Code Description Active Date Inactive Date L97.828 Non-pressure chronic ulcer of other part of left lower leg with other specified severity 03/03/2023 03/03/2023 L03.116 Cellulitis of left lower limb 05/26/2023 05/26/2023 Resolved Problems Electronic Signature(s) Signed: 06/02/2023 4:32:13 PM By: [name] Entered By: [name] Najjar on 06/02/2023 13:14:12 -------------------------------------------------------------------------------- Progress Note Details Patient Name: Date of Service: [name] Asper NDY J. 06/02/2023 2:30 PM Medical Record Number: [id number] Patient Account Number: [id number] Date of Birth/Sex: Treating [name]: 10-Mar-1956 (67 y.o. M) Primary Care Provider: Hillard Danker Other Clinician: Referring Provider: Treating Provider/Extender: Shara Blazing in Treatment: 13 Subjective History of Present Illness (HPI) ADMISSION 07/29/2018 Mr. Sieg is a 67 year old man with either prediabetes or diabetes he is on glipizide. In late October to November 2019 he noted a scabbed area on the back of his right calf. He picked this off a few times but it would not heal. He saw his primary physician on 12/5. It was felt at that time that this may actually heal on its  own with conservative management. The next visit was on 07/20/2018 noted the area was a lot worse and arranged for his treatment here. He has been topical antibiotics like Neosporin although he stopped using this when the wound looked worse. He is just been covering this with a clean Band-Aid. He is given [name], [name] ([id number]) [id number] Page 7 of 11 Bactrim a week ago and he is finishing this currently. It is made some improvement in the surrounding erythema per the patient. He does not have a history of nonhealing wounds. No prior history of wounds on his legs that he had difficulty healing. No prior skin issues. 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 Past medical history; A. fib status post ablation and recent cardioversion he is on Xarelto. He has a history of systolic heart failure, cervical radiculopathy, cardiomyopathy and type 2 diabetes as discussed ABI in the right leg was noncompressible on the right 2/20; the biopsy I did on the patient last week was negative for malignancy. Culture grew Pseudomonas and methicillin sensitive staph aureus. He is on cefdinir 300 twice a day. I will have to make this a 10 day course. He put him in compression. He states that the redness pain and erythema are a lot better. I suspect the patient has chronic venous insufficiency probably with a secondary cellulitis 2/27; arrives today with copious amounts of drainage coming out of the wound irritating the skin below the wound area. He only renewed the final 3 days of cefdinir today 3/5; came in for a nurse change 3 days ago. Again a lot of drainage noted. Zinc oxide was applied unfortunately the drainage appears to a pool then he has a string of superficial open areas extending down into the Achilles area and some just below the wound. The wound itself does not look too bad. He is completed the antibiotics although apparently there was separation from the  original 7 with the last 3 days. Culture grew a few MSSA  and a few Pseudomonas 3/12; not too much difference over the last week. He came in for a dressing change on Monday by our nursing staff. Necrotic debris again over the wound surface. The entire area looks irritated but nontender and I do not think shows obvious evidence of infection. We have not heard anything about the reflux studies. Also notable than in this diabetic man we had noncompressible vessels and although I can feel his pulses easily in his feet I will order arterial studies as well. 3/19-Patient had experience more pain has been dressed with a silver alginate, the wounds all have necrotic debris, very friable with easy bleeding with any kind of surface debridement including with gauze and Anasept and with a #3 curette. His vascular appointments unfortunately were all canceled on account of the virus outbreak resulting in studies only being done for emergent cases. His pulses are easily palpable in the lower extremity. He has open areas below the primary wound where he states the drainage which included purulent material also with some blood pooled and caused breakdown. 3/26; the patient was changed to Santyl with [facility] backing last week. This was largely because the silver alginate was sticking to the wound. He is still having quite a bit of pain. He tells [place] that the reflux studies and arterial studies we had attempted to arrange through vein and vascular are not being booked until mid May. Culture that was done last week showed both Serratia moderate and a few methicillin sensitive staph aureus I started him on Bactrim DS 1 p.o. twice daily on 3/23 for 7 days. He is here for follow-up. 4/3; patient is on Santyl with Hydrofera Blue. Patient complains of pain. Our intake nurse is noted drainage and some odor. He has completed Bactrim recently for Serratia and a few methicillin sensitive staph aureus. After our staff push  hard last week we were able to get venous studies as well as arterial studies. His arterial studies showed on the right great toe pressure of 0.86. On the left ABI at 1.47 TBI of 0.87. On both sides waveforms were triphasic VENOUS STUDIES showed reflux in the femoral vein, popliteal vein great saphenous vein at the saphenofemoral junction and great saphenous vein at the proximal thigh. Also noted to have age-indeterminate superficial vein thrombosis involving the small saphenous vein. Notable that the patient is already on Xarelto for atrial fibrillation. 4/9; the patient has been to see vascular surgery and reviewed by Dr. Durwin Nora. He was felt to have only minimal superficial venous reflux in the right great greater saphenous vein. For this reason he was not felt to be a candidate for laser ablation of the right greater saphenous vein. A wedge cushion was suggested to keep his leg elevated. He does have deep venous reflux on the right. Culture I did last time showed a combination of Serratia Pseudomonas and methicillin sensitive staph aureus. We have been using Hydrofera Blue to the large wound, Santyl to some of the deep punched out satellite lesions distal to it. There is some improvement 4/16; the application for Apligraf was put out for further review for material that we have resubmitted. He has the deep area on the right posterior calf which is large and then 3 small punched out areas beneath this. In general his wounds look a lot better 4/23; the patient has his large original wound and then 2 punched out satellite lesions below it on the right posterior calf. I was usually able to use Apligraf #  1 to cover the full surface area of the larger wound. We continued with Santyl and Hydrofera Blue on the 2 punched-out areas 5/7; patient has his large original wound and 2 punched out satellite lesions below it in the right posterior calf. Apligraf #2 today. Major improvement in the big wound in terms  of wound depth. Punched-out [place] wounds have a very healthy looking wound bed. 5/21-Patient comes back for his large right posterior calf wound for which she has been an Apligraf #3 today. Wound depth seems to be better, the punched-out wounds appear to have healthy bed with some bleeding 6/4; right posterior calf wound is much better. Apligraf #4 today. The major wound has no wound depth at all. Only 1 of the satellite lesions is still open. The patient has very high blood pressure coming in today with a diastolic blood pressure of 130 after lying there for a few minutes it was down to 110. He states that is running 100 210 diastolic at home. He was high last week as well I thought he was on 50 mg 1/2 tablet twice daily of losartan I told him to double up on that however it turns out he is actually on 50 twice daily. Course he is run out of his medications prematurely. He has a follow-up with his primary's office next Wednesday. 6/18; patient's wounds continue to improve. The small area laterally is just about closed and there is continued epithelialization on the area on the posterior calf. 7/2; we applied his last Apligraf last visit. Early the next week there was a lot of purulent looking drainage that I cultured this grew staph and Pseudomonas however when we had him back for the next visit to 3 days later everything looked a lot better. He did not receive systemic antibiotics. Fortunately we did not have to remove the Apligraf. He has had heart trouble this week he was having an ablation apparently they have discovered a clot in his left atrium they have changed all his anticoagulants as well as his blood pressure medication. 7/16; using Hydrofera Blue. We are making nice progress towards closure. He has not yet ordered his compression stockings he has his measurements 7/23; dimensions are better. He has a new satellite area superiorly. Both wounds cauterized with silver nitrate. 7/30;  absolutely no change in the major wound on the posterior part of the right calf. We have been using Hydrofera Blue for a prolonged period of time and really had a nice improvement but over the last few weeks this is stalled. There is no depth. He arrived in clinic today with a new area on the right anterior tibia which apparently was an "ingrown hair". It is clearly an open area. This looks like a wrap injury although he was not really aware of it. 8/31; since the patient was last in clinic he was admitted to hospital from 8/17 through 8/22. He had had multiple falls at home including one off a ladder. He was admitted to hospital with mild COVID-19 viral pneumonia. Noted to be hyperkalemic and in acute renal failure. He has chronic systolic heart failure with ejection fraction of 35%. He arrives back in clinic with the original wound on the posterior aspect of the right lower calf looking a lot better. He has eschared areas from falls and lacerations on his anterior knees bilaterally just below the patella and on the mid part of his tibia bilaterally. He seems to have a small area on the right lateral ankle  as well 9/10- Patient comes to clinic with a 2 new wounds one on the left anterior shin and one on the right posterior leg both the left knee and right knee areas are healed up. We have been using Santyl to the left anterior leg and silver alginate to right posterior leg wounds. 9/17; the patient is original wound looks good on the posterior right calf. He has traumatic areas on the left anterior shin left anterior patellar tendon and the right mid tibia area. Except for the right posterior calf all required debridement. We have been using Santyl on these areas and silver alginate posteriorly right calf 9/24; the original wound on the posterior calf on the right looks good. This is healthy. There are traumatic wounds in the left anterior shin, left anterior patella in the right mid tibia area are  about the same. He has dusky erythema on the left anterior tibia which led me to give him antibiotics last week. This does not look too much different. This is nontender and I wonder if this is all just venous inflammation. 10/2; the patient has 2 open areas on the right posterior calf both of these look good we have been using silver alginate. He has traumatic wounds on the left and right mid tibia areas. The surface of these wounds is still not ready for closure. We have been using Iodoflex. Finally has areas on the left knee. The latter wounds are all traumatic after a fall 10/9. His original wounds of the right posterior calf are superficial and progressing towards closure. [name], [name] ([id number]) [id number] Page 8 of 11 His traumatic wounds on the left anterior tibia and right anterior tibia are considerably improved in terms of the wound surface. He asked me about a skin graft in these areas I do not think this is going to be necessary. Change from Iodosorb to [facility]. The area on the left knee is just about closed as well 10/16; his original wound on the right posterior calf very superficial looks healthy. He has more recent traumatic wounds on the left anterior tibia and right anterior tibia both of these have better looking surfaces and measuring smaller. Might be beneficial for an advanced treatment product. The area on his left knee has a small superficial scab and we are going to close that when 10/23; his original wound areas on the right posterior calf continue to improve. The more recently traumatic wounds on the left anterior tibia and right anterior tibia both look better in terms of surfaces. No debridement required. We have been using Hydrofera Blue. He is approved for Apligraf 11/3; Apligraf #1 applied to the left anterior tibia right anterior tibia and to 2 small areas remaining on the right posterior calf which were part of his initial  wound area. 11/19; Apligraf #2. Left anterior tibia is healed. Right anterior tibia much better. Only one small area remains on the right posterior calf which was part of his initial wound area 12/3; the left anterior tibia has opened up again. Hyper granulated nodule. Right anterior tibia is superficial and larger. There is no depth of this. I did not place Apligraf today return to [facility] under compression 12/10; the patient has 2 remaining wounds 1 on the right posterior and the other on the left anterior. Both of these look quite good. No debridement is required we have been using Hydrofera Blue under compression. 12/17; the patient has really not closed over which is disappointing. His wraps were  very tight and there may be some wrap injury issues. On the left he has the original wound on the medial mid tibia he has a wrap injury on the lateral mid tibia. On the right his posterior areas are superficial but certainly not closed 12/31; the patient has totally closed on the left. He will transition to his own 20/30 below-knee stocking on the left leg today. Interestingly on the right posterior calf the 2 wounds that he had from last time of closed however there is an additional injury in this same area. Almost looks as though there is subcutaneous fluid/blistering. There is no evidence of cellulitis this does not seem tender 06/24/2019; the patient comes in having been placed in his 20/30 stockings on the left leg last week. He states that he noted blisters break open on Saturday and they had opened into wounds on the anterior tibial area on the left by Monday. The area on the posterior right leg looks satisfactory we are still doing that and compression. We are using calcium alginate to all wound areas 1/14; the patient had juxta light however one of them was not the right size which will need to be returned. The new area last week on the left anterior tibia looks a lot better it measures  smaller I would say the area on the right is about the same, this is on the right posterior calf. We have been using silver alginate 1/21; he has his bilateral juxta lites however the left anterior wound continues to look better where is the 2 areas on the right posterior calf look about the same. We have been using Hydrofera Blue under compression with the juxta lite stockings now on standby 2/4; the areas on the left are totally healed and he will be discharged into his own stocking/juxta light. On the right posterior calf he has what looks to be a blister with a small area of subdermal hemorrhage this is very tiny but I did I am going to wrap him today and see if this will be reabsorbed by next week 2/11; he comes in this week with expanded wounds on the posterior calf as well as a new wound on the left anterior. He said the left anterior started a few days ago with a blister that is now surface. He is using his juxta lite stockings at a pressure of 30/40 2/19; we wrapped his left leg last week after a failed attempt to discharge that leg and his juxta lites 2 weeks ago. The leg is again healed he has superficial areas posteriorly on the right leg 2/26; the patient came in initially with right posterior calf wound secondary to minor trauma with secondary infection. He went on to develop a left lower leg wound. In the interim he had congestive heart failure with acute renal failure a fall with bilateral anterior leg wounds and finally Covid requiring admission to pneumonia with pneumonia. He is finally closed. He has external compression stockings bilaterally [juxta lite stockings]. His wounds are healed READMISSION 03/03/2023 This is a patient that we had here for a year from 20 20 through 20 21 with a sizable wound on the right posterior calf which was secondary to a combination of chronic venous insufficiency and secondary infection in the wound when he came here. This was a problematic wound in  itself however he developed traumatic wounds on his lower extremities after a fall from a ladder during the same admission. Eventually he was healed out and discharged  and juxta lite stockings. He tells me that everything was fine up until about 2-1/2 months ago he developed a blister and an open area on the left posterior calf this time. He saw his primary doctor and received a course of Bactrim. He has been using Silvadene with AandE ointment and covering this with gauze. He is here for review of this area. When he was here in 2021 he also saw Dr. Woodfin Ganja of vein and vascular he had venous reflux studies that showed deep venous reflux but nothing that was amenable to surgery. He also had arterial studies. He was noncompressible on the left but had normal TBIs and triphasic waveforms. In terms of the reflux studies I am not really sure whether the left leg was involved in that study or not we will need to review that. His past medical history includes type type 2 diabetes, chronic venous insufficiency with secondary lymphedema, intermittent A-fib, nonischemic cardiomyopathy, thrombus of the left atrial appendage, hereditary hemochromatosis cytosis. He is now on Coumadin presumably for the atrial fibrillation. ABIs in this clinic were not repeated 9/24; posterior left calf wound. We used Hydrofera Blue under compression. The wound looks better this week better looking surface. 10/1. Posterior left calf wound secondary to venous insufficiency. He was new to the clinic in 2 weeks ago at which time I thought he was going to require a difficult set of mechanical debridements however the wound surface is continuing to improve using Hydrofera Blue although the dimensions not so remarkably improved today 10/8; posterior left calf wound secondary to chronic venous insufficiency. He continues to think nice progress here on this wound the wound is smaller better looking surface. He arrived in clinic today  with an abrasion on his Achilles area on the left leg as well as what looked to be a potential deep tissue injury on the lateral lateral part of the left foot at the level of the base of the fifth metatarsal. 10/15 posterior left calf wound continues to improve. We have been using Prisma under ABDs under Urgo K2 lite's. The areas on the Achilles and the left lateral foot which were concerning last time of maintain skin integrity. He is not wearing stockings even on the right leg I talked to him about this. 10/22; posterior left calf wound measures slightly larger today. The abrasion injury in the Achilles send on the lateral fifth metatarsal base look improved. We have been using Prisma under compression I am going to change this to [facility] today 10/29; posterior left calf wound continues to progress nicely. Presumably an abrasion injury on the Achilles heel. I changed dressing to Hydrofera Blue. Will need to dress the area on the Achilles today. The area on the fifth metatarsal base is not open 11/5; posterior left calf wound continues to improve nicely presumably an abrasion injury on the left Achilles also looks clean. He has a what looks to be an abrasion on the base of the fifth metatarsal it is not open but it looks as though there is been some friction on this area we will pad this today under the wrap. Patient with chronic venous insufficiency severe hemosiderin deposition and resultant wounds. He has been in the clinic before however that was for a wound on the right posterior calf. Notable he is not wearing stockings even on the uninvolved right leg today 11/11; his original wound on the posterior left calf continues to improve in dimensions. Once again he comes in today with some  black eschar around the edge I wonder if this is bleeding. He has the area on the Achilles and now a new area on the left upper lower leg which he says he scratched. And finally he has an area on the right  anterior mid tibia which he said he got from taking off his stocking 11/19; he has wounds on his bilateral lower legs. We have been wrapping with Urgo K2's. He has original wound on the left posterior calf, probably a wrap injury on the left lateral calf, area on the left Achilles, and finally the right medial lower leg. We have been using [address] [place], [place] ([id number]) [id number] Page 9 of 11 12/3; came in this with.a wound on his left posterior leg. since then her has developed a more distal left wound and a right anterior. A wound of the left lateral has healed. We are using HFB with Urgo K2 12/10; the patient initially came in with a wound on his left posterior leg. This is healed today. He subsequently developed an area on the left Achilles heel and an area on the right anterior mid tibia area. We have been using Hydrofera Blue however intake reports that these are getting stuck to the wound. Also he has an area of painful erythema around the posterior left Achilles area. This could be rubbing in the wrap however I think coverage with antibiotics for cellulitis is certainly indicated we will be changing the primary dressing to polymen silver 12/17; his left posterior leg wound which was his original wound has remained closed. However he subsequently developed an area on the left Achilles heel and the right anterior mid tibia. Last week the area on the Achilles heel had surrounding erythema I gave him a course of doxycycline and this seems to have resolved at least in terms of the erythema. He still however having discomfort in this wound. The area on the right anterior mid tibia looks really quite healthy. We have been polymen silver. Objective Constitutional Sitting or standing Blood Pressure is within target range for patient.. Pulse regular and within target range for patient.[name] Respirations regular, non-labored and within target range.. Temperature  is normal and within the target range for the patient.[name] Appears in no distress. Vitals Time Taken: 2:21 PM, Height: 73 in, Weight: 254 lbs, BMI: 33.5, Temperature: 98.1 F, Pulse: 70 bpm, Respiratory Rate: 18 breaths/min, Blood Pressure: 112/78 mmHg. Cardiovascular Severe venous hypertension but controlled edema. General Notes: Wound exam; the left posterior calf which was his original wound is healed Painful wound on the left Achilles required some debridement today because of adherent surface slough. Hemostasis with direct pressure. He bleeds freely because of use of Coumadin. The area on the right anterior lower leg really looks quite healthy no debridement was required here. Integumentary (Hair, Skin) Wound #14 status is Open. Original cause of wound was Blister. The date acquired was: 04/07/2023. The wound has been in treatment 7 weeks. The wound is located on the Left,Posterior Achilles. The wound measures 2cm length x 2.8cm width x 0.1cm depth; 4.398cm^2 area and 0.44cm^3 volume. There is Fat Layer (Subcutaneous Tissue) exposed. There is no tunneling or undermining noted. There is a medium amount of serosanguineous drainage noted. The wound margin is distinct with the outline attached to the wound base. There is medium (34-66%) red granulation within the wound bed. There is a medium (34-66%) amount of necrotic tissue within the wound bed including Adherent Slough. The periwound skin appearance had no abnormalities noted  for moisture. The periwound skin appearance exhibited: Scarring, Hemosiderin Staining. Periwound temperature was noted as No Abnormality. Wound #15 status is Open. Original cause of wound was Trauma. The date acquired was: 02/25/2023. The wound has been in treatment 5 weeks. The wound is located on the Right,Anterior Lower Leg. The wound measures 2cm length x 1.8cm width x 0.1cm depth; 2.827cm^2 area and 0.283cm^3 volume. There is Fat Layer (Subcutaneous Tissue) exposed. There  is no tunneling or undermining noted. There is a medium amount of serosanguineous drainage noted. The wound margin is distinct with the outline attached to the wound base. There is large (67-100%) red granulation within the wound bed. There is no necrotic tissue within the wound bed. The periwound skin appearance had no abnormalities noted for texture. The periwound skin appearance had no abnormalities noted for moisture. The periwound skin appearance exhibited: Hemosiderin Staining. The periwound skin appearance did not exhibit: Atrophie Blanche, Cyanosis, Ecchymosis, Mottled, Pallor, Rubor, Erythema. Periwound temperature was noted as No Abnormality. 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 There was a Excisional Skin/Subcutaneous Tissue Debridement with a total area of 4.4 sq cm performed by [name] Caul., [name]. With the following instrument(s): Curette to remove Viable and Non-Viable tissue/material. Material removed includes Subcutaneous Tissue and Slough and after achieving pain control using Lidocaine. No specimens were taken. A time out was conducted at 14:48, prior to the start of the procedure. A Minimum amount of bleeding was controlled with Pressure. The procedure was tolerated well with a pain level of 0 throughout and a pain level of 0 following the procedure. Post Debridement Measurements: 2cm length x 2.8cm width x 0.1cm depth; 0.44cm^3 volume. Character of Wound/Ulcer Post Debridement is  improved. Severity of Tissue Post Debridement is: Fat layer exposed. Post procedure Diagnosis Wound #14: Same as Pre-Procedure Pre-procedure diagnosis of Wound #14 is a Diabetic Wound/Ulcer of the Lower Extremity located on the Left,Posterior Achilles . There was a [address] [place], [name] ([id number]) [id number] Page 10 of 11 Compression Therapy Procedure by [name] Mu, [name]. Post procedure Diagnosis Wound #14: Same as Pre-Procedure Wound #15 Pre-procedure diagnosis of Wound #15 is an Abrasion located on the Right,Anterior Lower Leg . There was a Four Layer Compression Therapy Procedure by [name] Mu, [name]. Post procedure Diagnosis Wound #15: Same as Pre-Procedure Plan Follow-up Appointments: Return Appointment in 1 week. - Dr. Leanord Hawking Anesthetic: (In clinic) Topical Lidocaine 4% applied to wound bed Bathing/ Shower/ Hygiene: May shower with protection but do not get wound dressing(s) wet. Protect dressing(s) with water repellant cover (for example, large plastic bag) or a cast cover and may then take shower. Edema Control - Orders / Instructions: Elevate legs to the level of the heart or above for 30 minutes daily and/or when sitting for 3-4 times a day throughout the day. Avoid standing for long periods of time. Exercise regularly WOUND #14: - Achilles Wound Laterality: Left, Posterior Cleanser: Soap and Water 1 x Per Week/15 Days Discharge Instructions: May shower and wash wound with dial antibacterial soap and water prior to dressing change. Cleanser: Vashe 5.8 (oz) 1  x Per Week/15 Days Discharge Instructions: Cleanse the wound with Vashe prior to applying a clean dressing using gauze sponges, not tissue or cotton balls. Peri-Wound Care: Sween Lotion (Moisturizing lotion) 1 x Per Week/15 Days Discharge Instructions: Apply moisturizing lotion as directed Prim Dressing: PolyMem Silver Non-Adhesive Dressing, 4.25x4.25 in 1 x Per Week/15  Days ary Discharge Instructions: Apply to wound bed as instructed Secondary Dressing: ABD Pad, 8x10 1 x Per Week/15 Days Discharge Instructions: Apply over primary dressing as directed. Com pression Wrap: Urgo K2, (equivalent to a 4 layer) two layer compression system, regular 1 x Per Week/15 Days Discharge Instructions: Apply Urgo K2 as directed (alternative to 4 layer compression). WOUND #15: - Lower Leg Wound Laterality: Right, Anterior Cleanser: Soap and Water 1 x Per Week/15 Days Discharge Instructions: May shower and wash wound with dial antibacterial soap and water prior to dressing change. Cleanser: Vashe 5.8 (oz) 1 x Per Week/15 Days Discharge Instructions: Cleanse the wound with Vashe prior to applying a clean dressing using gauze sponges, not tissue or cotton balls. Peri-Wound Care: Sween Lotion (Moisturizing lotion) 1 x Per Week/15 Days Discharge Instructions: Apply moisturizing lotion as directed Prim Dressing: PolyMem Silver Non-Adhesive Dressing, 4.25x4.25 in 1 x Per Week/15 Days ary Discharge Instructions: Apply to wound bed as instructed Secondary Dressing: ABD Pad, 8x10 1 x Per Week/15 Days Discharge Instructions: Apply over primary dressing as directed. Com pression Wrap: Urgo K2, (equivalent to a 4 layer) two layer compression system, regular 1 x Per Week/15 Days Discharge Instructions: Apply Urgo K2 as directed (alternative to 4 layer compression). 1. Continue with polymen Ag ABDs under Urgo K2 light compression bilaterally 2. I did not think he required any additional antibiotics. 3. I started look back to his first admission here at which time I referred him to vein and vascular. He did not have an arterial issue. He had venous reflux studies but the comment was any reflux he had I do not think he had sufficient diameter to warrant intervention. I wonder about repeating these studies Electronic Signature(s) Signed: 06/02/2023 4:32:13 PM By: [name] Entered  By: [name] Najjar on 06/02/2023 13:22:12 -------------------------------------------------------------------------------- SuperBill Details Patient Name: Date of Service: [name] Coco. 06/02/2023 Medical Record Number: [id number] Patient Account Number: [id number] Date of Birth/Sex: Treating [name]: March 26, 1956 (67 y.o. [name], [name] Primary Care Provider: Hillard Danker Other Clinician: Referring Provider: Treating Provider/Extender: Shara Blazing in Treatment: [address] [place] ([id number]) [id number] Page 11 of 11 Diagnosis Coding ICD-10 Codes Code Description [phone] Non-pressure chronic ulcer of other part of left lower leg with other specified severity I87.332 Chronic venous hypertension (idiopathic) with ulcer and inflammation of left lower extremity L97.328 Non-pressure chronic ulcer of left ankle with other specified severity E11.622 Type 2 diabetes mellitus with other skin ulcer L97.818 Non-pressure chronic ulcer of other part of right lower leg with other specified severity Facility Procedures : CPT4 Code: [id number] Description: 11042 - DEB SUBQ TISSUE 20 SQ CM/< ICD-10 Diagnosis Description L97.828 Non-pressure chronic ulcer of other part of left lower leg with other specified severi Modifier: ty Quantity: 1 : CPT4 Code: [id number] Description: (Facility Use Only) 25956LO - APPLY MULTLAY COMPRS LWR RT LEG Modifier: 59 Quantity: 1 Physician Procedures : CPT4 Code Description Modifier [id number] 11042 - WC PHYS SUBQ TISS 20 SQ CM ICD-10 Diagnosis Description L97.828 Non-pressure chronic ulcer of other part of left lower leg with other specified severity Quantity: 1 Electronic Signature(s) Signed: 06/02/2023 4:32:13 PM By: [name] Najjar  [name] Previous Signature: 06/02/2023 3:08:42 PM Version By: [name] Entered By: [name] Najjar on 06/02/2023 13:22:27

## 2023-06-04 ENCOUNTER — Encounter: Payer: Self-pay | Admitting: Internal Medicine

## 2023-06-04 ENCOUNTER — Telehealth: Payer: Self-pay

## 2023-06-04 ENCOUNTER — Other Ambulatory Visit (HOSPITAL_COMMUNITY): Payer: Self-pay

## 2023-06-04 ENCOUNTER — Ambulatory Visit (INDEPENDENT_AMBULATORY_CARE_PROVIDER_SITE_OTHER): Payer: Medicare HMO | Admitting: Internal Medicine

## 2023-06-04 VITALS — BP 120/76 | HR 66 | Ht 73.0 in | Wt 260.0 lb

## 2023-06-04 DIAGNOSIS — E039 Hypothyroidism, unspecified: Secondary | ICD-10-CM

## 2023-06-04 DIAGNOSIS — Z794 Long term (current) use of insulin: Secondary | ICD-10-CM | POA: Diagnosis not present

## 2023-06-04 DIAGNOSIS — E1169 Type 2 diabetes mellitus with other specified complication: Secondary | ICD-10-CM

## 2023-06-04 DIAGNOSIS — E785 Hyperlipidemia, unspecified: Secondary | ICD-10-CM

## 2023-06-04 DIAGNOSIS — Z7985 Long-term (current) use of injectable non-insulin antidiabetic drugs: Secondary | ICD-10-CM | POA: Diagnosis not present

## 2023-06-04 DIAGNOSIS — Z7984 Long term (current) use of oral hypoglycemic drugs: Secondary | ICD-10-CM

## 2023-06-04 LAB — POCT GLYCOSYLATED HEMOGLOBIN (HGB A1C): Hemoglobin A1C: 6 % — AB (ref 4.0–5.6)

## 2023-06-04 NOTE — Progress Notes (Addendum)
Patient ID: COYOTE MAGALHAES, male   DOB: Feb 17, 1956, 67 y.o.   MRN: [id number]   HPI: EILJAH HEIDENREICH is a 67 y.o.-year-old male, initially referred by his PCP, 67 y.o. Dr. Okey Dupre, returning for follow-up for DM2, dx in 2017, insulin-dependent, uncontrolled, with complications (CHF, Afib/AFlutter, CKD, h/o calf diabetic ulcer).  Last visit 6 mo ago.  Interim history: He denies blurred vision, nausea.  He has nocturia-chronic. He has several and shin ulcers, of which 2 are active -: one on each leg. Seeing wound center. L foot in boot.  Reviewed HbA1c levels Lab Results  Component Value Date   HGBA1C 5.7 (A) 12/02/2022   HGBA1C 5.7 (A) 05/20/2022   HGBA1C 5.9 (A) 11/19/2021   HGBA1C 6.0 (A) 05/16/2021   HGBA1C 6.0 (A) 11/08/2020   HGBA1C 5.3 08/04/2020   HGBA1C 5.7 (A) 06/29/2020   HGBA1C 5.3 02/24/2020   HGBA1C 14.2 (H) 09/04/2019   HGBA1C 15.0 (H) 09/03/2019   In the past, he was on: - Levemir 40 units 2x a day - Novolog 5 units + SSI 3x a day, before meals: BG 70-120: 0 Units, BG 121-150:3 units, BG 151-200:4 Units, BG 201-250:7 units BG 251-300: 11 Units, 301-350 : 15 units. If BG >350: Call your primary care doctor He was on Glipizide since 2017.  Now on:  - Jardiance 10 mg before b'fast - Ozempic 0.5 mg weekly in a.m.  - Tresiba U200 66 >> 50 >> 40 >> 32 >> 28 units daily - Novolog 8-12 units based on the size of the meal (8-12-8 units) >> 8-10 (12) before meals >> tried to stop but had to restart >> 5 to 8 >> 8-12 units before a larger meal only  He checks his sugars more than 4 times a day with his freestyle libre CGM - from [facility]:  Prev.:  Previously:   Lowest sugar was  58 (delayed lunch) >> ...68 >> 70  >> 78 >> 100 ; he has hypoglycemia awareness in the 70s. Highest sugar was 330 >> .[name].230 >> 200 >> 190 >> 217. He wasadmitted in 08/2019 with hyperosmolar hyperglycemic nonketotic state, and was found to have a glucose of 1186, along with hyponatremia and AKI.  An HbA1c  level was very high.  Glucometer: [facility]   Pt's meals are: - Breakfast: fruit >>  >> -  almond milk with bran flakes, berries, nuts - Lunch: salad - Dinner: salad, chili, hamburger patty, grilled chicken -moved dinner earlier - Snacks:- He saw nutrition in 2016.  -+ CKD, last BUN/creatinine:  Lab Results  Component Value Date   BUN 14 10/08/2022   BUN 13 09/30/2022   CREATININE 1.23 10/08/2022   CREATININE 1.21 09/30/2022   Lab Results  Component Value Date   MICRALBCREAT 36.5 (H) 12/02/2022   MICRALBCREAT 51.1 (H) 05/16/2020   MICRALBCREAT 20.4 03/08/2019   MICRALBCREAT 9.2 11/26/2017  On Cozaar 50.  -+ HL; last set of lipids: Lab Results  Component Value Date   CHOL 142 09/30/2022   HDL 66 09/30/2022   LDLCALC 62 09/30/2022   LDLDIRECT 139.0 11/19/2021   TRIG 71 09/30/2022   CHOLHDL 2.2 09/30/2022  On Crestor 10.  - last eye exam was 01/20/2023: [facility] DR [facility] [place]. Coming up in 01/2023.  -No numbness and tingling in his feet.  He has a history of right calf ulcer- he required skin graft - 2020 - had a terrible time then.   He had another ulcer in the other leg  in 2020, before he was diagnosed with diabetes and he had to have extensive treatment for this.  He now has several ulcers of which 2 active.  He is going to the wound center every week.  He was seen in the vascular department in 2020. Last foot exam 05/2022.  I am not able to perform another exam today since his legs are wrapped and is left foot is in a boot.  Pt has no FH of DM.  Uncontrolled hypothyroidism:    Reviewed his TFTs: Lab Results  Component Value Date   TSH 0.79 12/02/2022   TSH 1.79 11/19/2021   TSH 3.280 04/23/2021   TSH 3.401 01/09/2021   TSH 3.52 06/29/2020   TSH 4.61 (H) 05/16/2020   TSH 33.362 (H) 09/03/2019   TSH 13.910 (H) 02/02/2019   TSH 25.834 (H) 01/31/2019   TSH 4.800 (H) 05/03/2018   He takes levothyroxine 50 mcg daily: - in am - fasting - now  drinks black coffee - at least 30 min from b'fast - no calcium - no iron - + multivitamins in the evening - no PPIs - not on Biotin  He has a history of A. Fib and was found to have a thrombus in the left atrial appendage.  He had cardioversion in 08/2020.  He also has chronic systolic CHF.   He has a history of HTN, OSA.  Also, hereditary hemochromatosis, polycythemia. In the past he was drinking 4-5 beers every day.  Previously was drinking hard liquor.  He stopped drinking alcohol in 2021.  ROS: + See HPI  I reviewed pt's medications, allergies, PMH, social hx, family hx, and changes were documented in the history of present illness. Otherwise, unchanged from my initial visit note.  Past Medical History:  Diagnosis Date   Asthma    as a teenager - does not use an inhaler although pt said he has an albuteral inhaler   Atrial fibrillation (HCC)    persistant 02/2009   Atrial flutter (HCC)    s/p CTI ablation 08/06/10   Cataract    Chronic rhinitis    Colon polyp    Congestive heart failure (HCC)    Diverticulosis    colonoscopy 04/03/2009   Hyperlipidemia    Hypertension    Morbid obesity (HCC)    target weight = 219  for BMI < 30   Nonischemic cardiomyopathy (HCC)    tachycardia mediated   Seasonal allergies    Sleep apnea    original 2001 - wears C-PaP   Type 2 diabetes mellitus (HCC)    Past Surgical History:  Procedure Laterality Date   ATRIAL FIBRILLATION ABLATION N/A 12/14/2018   Procedure: ATRIAL FIBRILLATION ABLATION;  Surgeon: Hillis Range, MD;  Location: [facility] INVASIVE CV LAB;  Service: Cardiovascular;  Laterality: N/A;   ATRIAL FIBRILLATION ABLATION N/A 10/18/2020   Procedure: ATRIAL FIBRILLATION ABLATION;  Surgeon: Hillis Range, MD;  Location: [facility] INVASIVE CV LAB;  Service: Cardiovascular;  Laterality: N/A;   atrial flutter ablation  08/06/10   CARDIOVERSION N/A 07/05/2018   Procedure: CARDIOVERSION;  Surgeon: Jodelle Red, MD;  Location: [facility] ENDOSCOPY;   Service: Cardiovascular;  Laterality: N/A;   CARDIOVERSION N/A 08/08/2020   Procedure: CARDIOVERSION;  Surgeon: Laurey Morale, MD;  Location: [facility] ENDOSCOPY;  Service: Cardiovascular;  Laterality: N/A;   CARDIOVERSION N/A 08/29/2020   Procedure: CARDIOVERSION;  Surgeon: Dolores Patty, MD;  Location: [facility] ENDOSCOPY;  Service: Cardiovascular;  Laterality: N/A;   COLONOSCOPY     ICD IMPLANT  N/A 05/12/2022   Procedure: ICD IMPLANT;  Surgeon: Marinus Maw, MD;  Location: [facility] INVASIVE CV LAB;  Service: Cardiovascular;  Laterality: N/A;   POLYPECTOMY     RIGHT HEART CATH N/A 08/06/2020   Procedure: RIGHT HEART CATH;  Surgeon: Laurey Morale, MD;  Location: [facility] INVASIVE CV LAB;  Service: Cardiovascular;  Laterality: N/A;   RIGHT/LEFT HEART CATH AND CORONARY ANGIOGRAPHY N/A 11/01/2021   Procedure: RIGHT/LEFT HEART CATH AND CORONARY ANGIOGRAPHY;  Surgeon: Laurey Morale, MD;  Location: [facility] INVASIVE CV LAB;  Service: Cardiovascular;  Laterality: N/A;   TEE WITHOUT CARDIOVERSION N/A 12/14/2018   Procedure: TRANSESOPHAGEAL ECHOCARDIOGRAM (TEE);  Surgeon: Hillis Range, MD;  Location: [facility] INVASIVE CV LAB;  Service: Cardiovascular;  Laterality: N/A;   TEE WITHOUT CARDIOVERSION N/A 08/03/2020   Procedure: TRANSESOPHAGEAL ECHOCARDIOGRAM (TEE);  Surgeon: Thurmon Fair, MD;  Location: [facility] ENDOSCOPY;  Service: Cardiovascular;  Laterality: N/A;   TEE WITHOUT CARDIOVERSION N/A 08/08/2020   Procedure: TRANSESOPHAGEAL ECHOCARDIOGRAM (TEE);  Surgeon: Laurey Morale, MD;  Location: [facility] ENDOSCOPY;  Service: Cardiovascular;  Laterality: N/A;   TEE WITHOUT CARDIOVERSION N/A 10/18/2020   Procedure: TRANSESOPHAGEAL ECHOCARDIOGRAM (TEE);  Surgeon: Jake Bathe, MD;  Location: [facility] ENDOSCOPY;  Service: Cardiovascular;  Laterality: N/A;   Social History   Socioeconomic History   Marital status: Single    Spouse name: Not on file   Number of children: 0   Years of education: Not on file   Highest education level: Some  college, no degree  Occupational History   Occupation: Sales  Tobacco Use   Smoking status: Never   Smokeless tobacco: Never  Vaping Use   Vaping status: Never Used  Substance and Sexual Activity   Alcohol use: Yes    Alcohol/week: 21.0 standard drinks of alcohol    Types: 21 Cans of beer per week    Comment: previously quite heavy, working on cessation    Drug use: No   Sexual activity: Not on file  Other Topics Concern   Not on file  Social History Narrative   Lives in [place] and works for [employer]   Social Drivers of Health   Financial Resource Strain: Low Risk  (12/09/2022)   Overall Financial Resource Strain (CARDIA)    Difficulty of Paying Living Expenses: Not hard at all  Food Insecurity: No Food Insecurity (12/09/2022)   Hunger Vital Sign    Worried About Running Out of Food in the Last Year: Never true    Ran Out of Food in the Last Year: Never true  Transportation Needs: No Transportation Needs (12/09/2022)   PRAPARE - Administrator, Civil Service (Medical): No    Lack of Transportation (Non-Medical): No  Physical Activity: Insufficiently Active (12/09/2022)   Exercise Vital Sign    Days of Exercise per Week: 4 days    Minutes of Exercise per Session: 30 min  Stress: No Stress Concern Present (12/09/2022)   [employer] of Occupational Health - Occupational Stress Questionnaire    Feeling of Stress : Not at all  Social Connections: Unknown (12/09/2022)   Social Connection and Isolation Panel [NHANES]    Frequency of Communication with Friends and Family: More than three times a week    Frequency of Social Gatherings with Friends and Family: More than three times a week    Attends Religious Services: Patient declined    Database administrator or Organizations: No    Attends Banker Meetings: Not on file  Marital Status: Separated  Intimate Partner Violence: Not At Risk (06/20/2021)   Humiliation, Afraid, Rape, and Kick  questionnaire    Fear of Current or Ex-Partner: No    Emotionally Abused: No    Physically Abused: No    Sexually Abused: No   Current Outpatient Medications on File Prior to Visit  Medication Sig Dispense Refill   BD PEN NEEDLE NANO 2ND GEN 32G X 4 MM MISC USE 4 TIMES A DAY AS DIRECTED 400 each 1   carvedilol (COREG) 12.5 MG tablet TAKE 1.5 TABLETS (18.75 MG TOTAL) BY MOUTH 2 (TWO) TIMES DAILY WITH A MEAL. 270 tablet 3   cholecalciferol (VITAMIN D3) 25 MCG (1000 UNIT) tablet Take 1,000 Units by mouth at bedtime.     Continuous Blood Gluc Receiver (FREESTYLE LIBRE 14 DAY READER) DEVI USE AS DIRECTED 1 each 11   doxylamine, Sleep, (UNISOM) 25 MG tablet Take 50 mg by mouth at bedtime.     furosemide (LASIX) 20 MG tablet TAKE 1 TABLET BY MOUTH EVERY DAY 90 tablet 2   gabapentin (NEURONTIN) 300 MG capsule Take 1 capsule (300 mg total) by mouth 2 (two) times daily. 60 capsule 2   ibuprofen (ADVIL) 800 MG tablet Take 1 tablet (800 mg total) by mouth every 8 (eight) hours as needed. 30 tablet 2   insulin aspart (NOVOLOG FLEXPEN) 100 UNIT/ML FlexPen INJECT 8-12 UNITS INTO THE SKIN 3 (THREE) TIMES DAILY WITH MEALS. 15 mL 3   insulin degludec (TRESIBA FLEXTOUCH) 200 UNIT/ML FlexTouch Pen INJECT 28 UNITS INTO THE SKIN DAILY. 9 mL 1   JARDIANCE 10 MG TABS tablet TAKE 1 TABLET BY MOUTH EVERY DAY 30 tablet 5   levothyroxine (SYNTHROID) 50 MCG tablet TAKE 1 TABLET BY MOUTH EVERY DAY BEFORE BREAKFAST 90 tablet 2   mometasone (NASONEX) 50 MCG/ACT nasal spray Place 2 sprays into the nose daily as needed (allergies or rhinitis). 1 each 11   Multiple Vitamins-Minerals (MULTIVITAMIN WITH MINERALS) tablet Take 2 tablets by mouth at bedtime. Chewable     rosuvastatin (CRESTOR) 10 MG tablet TAKE 1 TABLET BY MOUTH EVERY DAY 90 tablet 3   sacubitril-valsartan (ENTRESTO) 97-103 MG Take 1 tablet by mouth 2 (two) times daily. 180 tablet 3   Semaglutide,0.25 or 0.5MG /DOS, (OZEMPIC, 0.25 OR 0.5 MG/DOSE,) 2 MG/3ML SOPN  INJECT 0.5 MG INTO THE SKIN ONE TIME PER WEEK 6 mL 1   silver sulfADIAZINE (SILVADENE) 1 % cream Apply 1 Application topically daily. 75 each 0   spironolactone (ALDACTONE) 25 MG tablet Take 1 tablet (25 mg total) by mouth daily. 90 tablet 3   SYMBICORT 80-4.5 MCG/ACT inhaler INHALE 2 PUFFS INTO THE LUNGS DAILY AS NEEDED (SHORTNESS OF BREATH). 30.6 each 1   vitamin C (ASCORBIC ACID) 500 MG tablet Take 500 mg by mouth in the morning.     warfarin (COUMADIN) 5 MG tablet TAKE 1 TABLET TO 1 AND 1/2 TABLETS BY MOUTH DAILY AS DIRECTED BY THE COUMADIN CLINIC. 135 tablet 0   No current facility-administered medications on file prior to visit.   No Known Allergies Family History  Problem Relation Age of Onset   Hypertension Mother    Kidney failure Mother        Due to sepsis   COPD Father    Colon cancer Brother        dx in 88's   Esophageal cancer Neg Hx    Rectal cancer Neg Hx    Stomach cancer Neg Hx    PE:  BP 120/76   Pulse 66   Ht 6\' 1"  (1.854 m)   Wt 260 lb (117.9 kg)   SpO2 97%   BMI 34.30 kg/m   Wt Readings from Last 3 Encounters:  06/04/23 260 lb (117.9 kg)  02/11/23 259 lb (117.5 kg)  01/30/23 264 lb (119.7 kg)   Constitutional: overweight, in NAD Eyes: EOMI, no exophthalmos ENT: no thyromegaly, no cervical lymphadenopathy Cardiovascular: RRR, No MRG Respiratory: CTA B Musculoskeletal: no deformities Skin: no rashes; left foot in boot, lower legs wrapped Neurological: + tremor with outstretched hands  ASSESSMENT: 1. DM2, insulin-dependent, uncontrolled, with complications - CHF - A. fib/a flutter - CKD - h/o Diabetic ulcer of calf - h/o HHNK - admitted with a glucose of >1000  -started on basal bolus regimen in the hospital  2.  Hypothyroidism - Uncontrolled  3. Obesity class 1  4. HL  PLAN:  1. Patient with uncontrolled type 2 diabetes, insulin-dependent, on SGLT2 inhibitor, basal-bolus insulin regimen and weekly GLP-1 receptor agonist, with improved  control.  His HbA1c decreased from 14.2% to a nadir of 5.3%.  At last visit, this was still excellent, at 5.7%, stable.  At that time, sugars were fluctuating within the target range with an occasional mildly hyperglycemic spike up to 190 after breakfast.  I did not recommend a change in treatment at that time. CGM interpretation: -At today's visit, we reviewed his CGM downloads: It appears that 92% of values are in target range (goal >70%), while 8% are higher than 180 (goal <25%), and 0% are lower than 70 (goal <4%).  The calculated average blood sugar is 141.  The projected HbA1c for the next 3 months (GMI) is 6.7%. -Reviewing the CGM trends, sugars appear to be well-controlled, only increasing after breakfast, occasionally slightly above 180.  This is the meal for which she sometimes takes his rapid acting insulin.  Will continue with this.  No need to change the rest of the regimen. - I suggested to:  Patient Instructions  Please continue: - Jardiance 10 mg before b'fast - Ozempic 0.5 mg weekly in a.m.  - Evaristo Bury U200 28 units daily - Novolog 8-12 units before a larger meal  Please continue Levothyroxine 50 mcg daily.  Take the thyroid hormone every day, with water, at least 30 minutes before breakfast, separated by at least 4 hours from: - acid reflux medications - calcium - iron - multivitamins  Please return in 6 months.   - we checked his HbA1c: 6.0% (slightly higher, but still at goal) - advised to check sugars at different times of the day - 4x a day, rotating check times - advised for yearly eye exams >> he is UTD - he has a history of microalbuminuria and at last check UACR was slightly elevated, although improved.  I advised him to make sure he is taking Cozaar consistently.  Will repeat this today. - He has multiple venous ulcers -he is seeing the wound center every week.  I also recommended to go back to see vascular again.  He saw them in 2020. - return to clinic in 6  months  2.  Hypothyroidism - latest thyroid labs reviewed with pt. >> normal: Lab Results  Component Value Date   TSH 0.79 12/02/2022  - he continues on LT4 50 mcg daily - pt feels good on this dose. - we discussed about taking the thyroid hormone every day, with water, >30 minutes before breakfast, separated by >4 hours from acid reflux medications,  calcium, iron, multivitamins. Pt. is taking it correctly. - will recheck his TFTs at next visit  3. Obesity class 1 -Will continue Ozempic and Jardiance which should also help with weight loss -He lost 8 pounds before last visit; weight is stable since then  4. HL -Reviewed latest lipid panel from 09/2022: Fractions at goal except for LDL slightly higher than our goal of less than 55: Lab Results  Component Value Date   CHOL 142 09/30/2022   HDL 66 09/30/2022   LDLCALC 62 09/30/2022   LDLDIRECT 139.0 11/19/2021   TRIG 71 09/30/2022   CHOLHDL 2.2 09/30/2022  -He is on Crestor 10 mg daily without side effects  Component     Latest Ref Rng 06/04/2023  Hemoglobin A1C     4.0 - 5.6 % 6.0 !   Microalb, Ur     mg/dL 2.7   MICROALB/CREAT RATIO     <30 mg/g creat 32 (H)   Creatinine, Urine     20 - 320 mg/dL 84   ACR lower than before, only slightly elevated now.  Carlus Pavlov, MD PhD [facility]

## 2023-06-04 NOTE — Progress Notes (Signed)
[name], [name] ([id number]) [phone].pdf Page 1 of 10 Visit Report for 06/02/2023 Arrival Information Details Patient Name: Date of Service: [name], [name] 06/02/2023 2:30 PM Medical Record Number: [id number] Patient Account Number: [id number] Date of Birth/Sex: Treating RN: January 27, 1956 (67 y.o. M) Primary Care Manessa Buley: Hillard Danker Other Clinician: Referring Sulaiman Imbert: Treating Taimane Stimmel/Extender: Shara Blazing in Treatment: 13 Visit Information History Since Last Visit Added or deleted any medications: No Patient Arrived: Ambulatory Any new allergies or adverse reactions: No Arrival Time: 14:10 Had a fall or experienced change in No Accompanied By: self activities of daily living that may affect Transfer Assistance: None risk of falls: Patient Identification Verified: Yes Signs or symptoms of abuse/neglect since last visito No Secondary Verification Process Completed: Yes Hospitalized since last visit: No Patient Requires Transmission-Based Precautions: No Implantable device outside of the clinic excluding No Patient Has Alerts: Yes cellular tissue based products placed in the center Patient Alerts: Patient on Blood Thinner since last visit: Has Dressing in Place as Prescribed: Yes Has Compression in Place as Prescribed: Yes Pain Present Now: No Electronic Signature(s) Signed: 06/03/2023 5:28:18 PM By: Thayer Dallas Entered By: Thayer Dallas on 06/02/2023 14:10:58 -------------------------------------------------------------------------------- Compression Therapy Details Patient Name: Date of Service: [name]. 06/02/2023 2:30 PM Medical Record Number: [id number] Patient Account Number: [id number] Date of Birth/Sex: Treating RN: Aug 14, 1955 (67 y.o. [name], [name] Primary Care Shavonda Wiedman: Hillard Danker Other Clinician: Referring Deric Bocock: Treating Sydna Brodowski/Extender: Shara Blazing in Treatment: 13 Compression Therapy Performed for Wound Assessment: Wound #14 Left,Posterior Achilles Performed By: Clinician Fonnie Mu, RN Compression Type: Four Layer Post Procedure Diagnosis Same as Pre-procedure Electronic Signature(s) Signed: 06/02/2023 2:39:50 PM By: Fonnie Mu RN Entered By: Fonnie Mu on 06/02/2023 14:39:50 [name], [name] ([id number]) [id number] Page 2 of 10 -------------------------------------------------------------------------------- Compression Therapy Details Patient Name: Date of Service: [name], [name] 06/02/2023 2:30 PM Medical Record Number: [id number] Patient Account Number: [id number] Date of Birth/Sex: Treating RN: [birth year] (67 y.o. [name], [name] Primary Care Lauramae Kneisley: Hillard Danker Other Clinician: Referring Matteson Blue: Treating Chuck Caban/Extender: Shara Blazing in Treatment: 13 Compression Therapy Performed for Wound Assessment: Wound #15 Right,Anterior Lower Leg Performed By: Clinician Fonnie Mu, RN Compression Type: Four Layer Post Procedure Diagnosis Same as Pre-procedure Electronic Signature(s) Signed: 06/02/2023 2:39:51 PM By: Fonnie Mu RN Entered By: Fonnie Mu on 06/02/2023 14:39:50 -------------------------------------------------------------------------------- Encounter Discharge Information Details Patient Name: Date of Service: [name] Asper NDY J. 06/02/2023 2:30 PM Medical Record Number: [id number] Patient Account Number: [id number] Date of Birth/Sex: Treating RN: [birth year] (67 y.o. [name], [name] Primary Care Latonga Ponder: Hillard Danker Other Clinician: Referring Barney Gertsch: Treating Jamal Haskin/Extender: Shara Blazing in Treatment: 13 Encounter Discharge Information Items Post Procedure Vitals Discharge Condition: Stable Temperature (F):  98.7 Ambulatory Status: Ambulatory Pulse (bpm): 74 Discharge Destination: Home Respiratory Rate (breaths/min): 17 Transportation: Private Auto Blood Pressure (mmHg): 120/80 Accompanied By: self Schedule Follow-up Appointment: Yes Clinical Summary of Care: Patient Declined Electronic Signature(s) Signed: 06/02/2023 3:09:27 PM By: Fonnie Mu RN Entered By: Fonnie Mu on 06/02/2023 15:09:27 -------------------------------------------------------------------------------- Lower Extremity Assessment Details Patient Name: Date of Service: [name] Asper NDY J. 06/02/2023 2:30 PM Medical Record Number: [id number] Patient Account Number: [id number] Date of Birth/Sex: Treating RN: 09-19-1955 (67 y.o. M) Primary Care Sahiba Granholm: Hillard Danker Other Clinician: Referring Zyren Sevigny: Treating Adalbert Alberto/Extender: Shara Blazing in Treatment: 13 Edema Assessment Assessed: [name]: No] Franne Forts: No] Edema: [Left: N] [Right: o] Calf LASTER, ELLENA ([id number]) [phone].pdf Page  3 of 10 Left: Right: Point of Measurement: 33 cm From Medial Instep 36 cm 37 cm Ankle Left: Right: Point of Measurement: 12 cm From Medial Instep 23 cm 21.5 cm Vascular Assessment Extremity colors, hair growth, and conditions: Extremity Color: [Left:Hyperpigmented] [Right:Hyperpigmented] Hair Growth on Extremity: [Left:No] [Right:No] Temperature of Extremity: [Left:Warm] [Right:Warm] Capillary Refill: [Left:< 3 seconds] [Right:< 3 seconds] Dependent Rubor: [Left:No Yes] [Right:No No] Electronic Signature(s) Signed: 06/03/2023 5:28:18 PM By: Thayer Dallas Entered By: Thayer Dallas on 06/02/2023 14:20:44 -------------------------------------------------------------------------------- Multi Wound Chart Details Patient Name: Date of Service: [name] Asper NDY J. 06/02/2023 2:30 PM Medical Record Number: [id number] Patient Account Number: [id number] Date of  Birth/Sex: Treating RN: 1956-04-28 (67 y.o. M) Primary Care Khadijah Mastrianni: Hillard Danker Other Clinician: Referring Talon Witting: Treating Remmie Bembenek/Extender: Shara Blazing in Treatment: 13 Vital Signs Height(in): 73 Pulse(bpm): 70 Weight(lbs): 254 Blood Pressure(mmHg): 112/78 Body Mass Index(BMI): 33.5 Temperature(F): 98.1 Respiratory Rate(breaths/min): 18 [14:Photos:] [N/A:N/A] Left, Posterior Achilles Right, Anterior Lower Leg N/A Wound Location: Blister Trauma N/A Wounding Event: Diabetic Wound/Ulcer of the Lower Abrasion N/A Primary Etiology: Extremity N/A Diabetic Wound/Ulcer of the Lower N/A Secondary Etiology: Extremity Asthma, Sleep Apnea, Arrhythmia, Asthma, Sleep Apnea, Arrhythmia, N/A Comorbid History: Congestive Heart Failure, Congestive Heart Failure, Hypertension, Type II Diabetes Hypertension, Type II Diabetes 04/07/2023 02/25/2023 N/A Date Acquired: 7 5 N/A Weeks of Treatment: Open Open N/A Wound Status: No No N/A Wound Recurrence: 2x2.8x0.1 2x1.8x0.1 N/A Measurements L x W x D (cm) 4.398 2.827 N/A A (cm) : rea 0.44 0.283 N/A Volume (cm) : -2143.90% -140.00% N/A % Reduction in Area: -2100.00% -19.90% N/A % Reduction in Volume: Grade 1 Full Thickness Without Exposed N/A ClassificationSREYAS, [name] ([id number]) [id number] Page 4 of 10 Support Structures Medium Medium N/A Exudate Amount: Serosanguineous Serosanguineous N/A Exudate Type: red, brown red, brown N/A Exudate Color: Distinct, outline attached Distinct, outline attached N/A Wound Margin: Medium (34-66%) Large (67-100%) N/A Granulation Amount: Red Red N/A Granulation Quality: Medium (34-66%) None Present (0%) N/A Necrotic Amount: Fat Layer (Subcutaneous Tissue): Yes Fat Layer (Subcutaneous Tissue): Yes N/A Exposed Structures: Fascia: No Fascia: No Tendon: No Tendon: No Muscle: No Muscle: No Joint: No Joint: No Bone:  No Bone: No None Small (1-33%) N/A Epithelialization: Debridement - Excisional N/A N/A Debridement: Pre-procedure Verification/Time Out 14:48 N/A N/A Taken: Lidocaine N/A N/A Pain Control: Subcutaneous, Slough N/A N/A Tissue Debrided: Skin/Subcutaneous Tissue N/A N/A Level: 4.4 N/A N/A Debridement A (sq cm): rea Curette N/A N/A Instrument: Minimum N/A N/A Bleeding: Pressure N/A N/A Hemostasis A chieved: 0 N/A N/A Procedural Pain: 0 N/A N/A Post Procedural Pain: Procedure was tolerated well N/A N/A Debridement Treatment Response: 2x2.8x0.1 N/A N/A Post Debridement Measurements L x W x D (cm) 0.44 N/A N/A Post Debridement Volume: (cm) Scarring: Yes Excoriation: No N/A Periwound Skin Texture: Induration: No Callus: No Crepitus: No Rash: No Scarring: No No Abnormalities Noted Maceration: No N/A Periwound Skin Moisture: Dry/Scaly: No Hemosiderin Staining: Yes Hemosiderin Staining: Yes N/A Periwound Skin Color: Atrophie Blanche: No Cyanosis: No Ecchymosis: No Erythema: No Mottled: No Pallor: No Rubor: No No Abnormality No Abnormality N/A Temperature: Compression Therapy Compression Therapy N/A Procedures Performed: Debridement Treatment Notes Wound #14 (Achilles) Wound Laterality: Left, Posterior Cleanser Soap and Water Discharge Instruction: May shower and wash wound with dial antibacterial soap and water prior to dressing change. Vashe 5.8 (oz) Discharge Instruction: Cleanse the wound with Vashe prior to applying a clean dressing using gauze sponges, not tissue or cotton balls. Peri-Wound Care Sween Lotion (Moisturizing  lotion) Discharge Instruction: Apply moisturizing lotion as directed Topical Primary Dressing PolyMem Silver Non-Adhesive Dressing, 4.25x4.25 in Discharge Instruction: Apply to wound bed as instructed Secondary Dressing ABD Pad, 8x10 Discharge Instruction: Apply over primary dressing as directed. Secured With Compression  Wrap Urgo K2, (equivalent to a 4 layer) two layer compression system, regular Discharge Instruction: Apply Urgo K2 as directed (alternative to 4 layer compression). Compression Stockings [name], [name] ([id number]) [id number] Page 5 of 10 Add-Ons Wound #15 (Lower Leg) Wound Laterality: Right, Anterior Cleanser Soap and Water Discharge Instruction: May shower and wash wound with dial antibacterial soap and water prior to dressing change. Vashe 5.8 (oz) Discharge Instruction: Cleanse the wound with Vashe prior to applying a clean dressing using gauze sponges, not tissue or cotton balls. Peri-Wound Care Sween Lotion (Moisturizing lotion) Discharge Instruction: Apply moisturizing lotion as directed Topical Primary Dressing PolyMem Silver Non-Adhesive Dressing, 4.25x4.25 in Discharge Instruction: Apply to wound bed as instructed Secondary Dressing ABD Pad, 8x10 Discharge Instruction: Apply over primary dressing as directed. Secured With Compression Wrap Urgo K2, (equivalent to a 4 layer) two layer compression system, regular Discharge Instruction: Apply Urgo K2 as directed (alternative to 4 layer compression). Compression Stockings Add-Ons Electronic Signature(s) Signed: 06/02/2023 4:32:13 PM By: Baltazar Najjar MD Entered By: Baltazar Najjar on 06/02/2023 16:14:22 -------------------------------------------------------------------------------- Multi-Disciplinary Care Plan Details Patient Name: Date of Service: [name] Asper NDY J. 06/02/2023 2:30 PM Medical Record Number: [id number] Patient Account Number: [id number] Date of Birth/Sex: Treating RN: 1956/01/17 (67 y.o. [name], [name] Primary Care Sarea Fyfe: Hillard Danker Other Clinician: Referring Treyvion Durkee: Treating Crystal Scarberry/Extender: Shara Blazing in Treatment: 13 Active Inactive Wound/Skin Impairment Nursing Diagnoses: Impaired tissue integrity Knowledge  deficit related to ulceration/compromised skin integrity Goals: Patient/caregiver will verbalize understanding of skin care regimen Date Initiated: 03/03/2023 Target Resolution Date: 06/12/2023 Goal Status: Active Ulcer/skin breakdown will have a volume reduction of 30% by week 4 Date Initiated: 03/03/2023 Date Inactivated: 05/05/2023 Target Resolution Date: 04/18/2023 Unmet Reason: see wound Goal Status: Unmet measurements. [name], [name] ([id number]) [phone].pdf Page 6 of 10 Interventions: Assess patient/caregiver ability to obtain necessary supplies Assess patient/caregiver ability to perform ulcer/skin care regimen upon admission and as needed Assess ulceration(s) every visit Provide education on ulcer and skin care Notes: Electronic Signature(s) Signed: 06/02/2023 2:39:08 PM By: Fonnie Mu RN Entered By: Fonnie Mu on 06/02/2023 14:39:08 -------------------------------------------------------------------------------- Pain Assessment Details Patient Name: Date of Service: [name] Asper NDY J. 06/02/2023 2:30 PM Medical Record Number: [id number] Patient Account Number: [id number] Date of Birth/Sex: Treating RN: [birth year] (67 y.o. M) Primary Care Elizandro Laura: Hillard Danker Other Clinician: Referring Feras Gardella: Treating Artasia Thang/Extender: Shara Blazing in Treatment: 13 Active Problems Location of Pain Severity and Description of Pain Patient Has Paino No Site Locations Pain Management and Medication Current Pain Management: Electronic Signature(s) Signed: 06/03/2023 5:28:18 PM By: Thayer Dallas Entered By: Thayer Dallas on 06/02/2023 14:11:41 -------------------------------------------------------------------------------- Patient/Caregiver Education Details Patient Name: Date of Service: [name] 12/17/2024andnbsp2:30 PM Medical Record Number: [id number] Patient Account Number: [id number] Date  of Birth/Gender: Treating RN: [birth year] (67 y.o. Lucious Groves Primary Care Physician: Hillard Danker Other Clinician: [name] ([id number]) [id number] Page 7 of 10 Referring Physician: Treating Physician/Extender: Shara Blazing in Treatment: 13 Education Assessment Education Provided To: Patient Education Topics Provided Wound/Skin Impairment: Methods: Explain/Verbal Responses: Reinforcements needed, State content correctly Electronic Signature(s) Signed: 06/02/2023 4:32:11 PM By: Fonnie Mu RN Entered By: Fonnie Mu on 06/02/2023 14:39:24 -------------------------------------------------------------------------------- Wound Assessment Details Patient Name: Date  of Service: [name], [name] 06/02/2023 2:30 PM Medical Record Number: [id number] Patient Account Number: [id number] Date of Birth/Sex: Treating RN: [birth year] (67 y.o. M) Primary Care Shakayla Hickox: Hillard Danker Other Clinician: Referring Madoline Bhatt: Treating Khyleigh Furney/Extender: Shara Blazing in Treatment: 13 Wound Status Wound Number: 14 Primary Diabetic Wound/Ulcer of the Lower Extremity Etiology: Wound Location: Left, Posterior Achilles Wound Open Wounding Event: Blister Status: Date Acquired: 04/07/2023 Comorbid Asthma, Sleep Apnea, Arrhythmia, Congestive Heart Failure, Weeks Of Treatment: 7 History: Hypertension, Type II Diabetes Clustered Wound: No Photos Wound Measurements Length: (cm) 2 Width: (cm) 2.8 Depth: (cm) 0.1 Area: (cm) 4.398 Volume: (cm) 0.44 % Reduction in Area: -2143.9% % Reduction in Volume: -2100% Epithelialization: None Tunneling: No Undermining: No Wound Description Classification: Grade 1 Wound Margin: Distinct, outline attached Exudate Amount: Medium Exudate Type: Serosanguineous Exudate Color: red, brown Foul Odor After Cleansing: No Slough/Fibrino  Yes Wound Bed [name], [name] ([id number]) [phone].pdf Page 8 of 10 Granulation Amount: Medium (34-66%) Exposed Structure Granulation Quality: Red Fascia Exposed: No Necrotic Amount: Medium (34-66%) Fat Layer (Subcutaneous Tissue) Exposed: Yes Necrotic Quality: Adherent Slough Tendon Exposed: No Muscle Exposed: No Joint Exposed: No Bone Exposed: No Periwound Skin Texture Texture Color No Abnormalities Noted: No No Abnormalities Noted: No Scarring: Yes Hemosiderin Staining: Yes Moisture Temperature / Pain No Abnormalities Noted: Yes Temperature: No Abnormality Treatment Notes Wound #14 (Achilles) Wound Laterality: Left, Posterior Cleanser Soap and Water Discharge Instruction: May shower and wash wound with dial antibacterial soap and water prior to dressing change. Vashe 5.8 (oz) Discharge Instruction: Cleanse the wound with Vashe prior to applying a clean dressing using gauze sponges, not tissue or cotton balls. Peri-Wound Care Sween Lotion (Moisturizing lotion) Discharge Instruction: Apply moisturizing lotion as directed Topical Primary Dressing PolyMem Silver Non-Adhesive Dressing, 4.25x4.25 in Discharge Instruction: Apply to wound bed as instructed Secondary Dressing ABD Pad, 8x10 Discharge Instruction: Apply over primary dressing as directed. Secured With Compression Wrap Urgo K2, (equivalent to a 4 layer) two layer compression system, regular Discharge Instruction: Apply Urgo K2 as directed (alternative to 4 layer compression). Compression Stockings Add-Ons Electronic Signature(s) Signed: 06/03/2023 5:28:18 PM By: Thayer Dallas Entered By: Thayer Dallas on 06/02/2023 14:24:02 -------------------------------------------------------------------------------- Wound Assessment Details Patient Name: Date of Service: [name] Asper NDY J. 06/02/2023 2:30 PM Medical Record Number: [id number] Patient Account Number: [id number] Date of  Birth/Sex: Treating RN: 05-08-1956 (67 y.o. M) Primary Care Quinterius Gaida: Hillard Danker Other Clinician: Referring Kaysen Sefcik: Treating Kanyia Heaslip/Extender: Shara Blazing in Treatment: 13 Wound Status Wound Number: 15 Primary Abrasion Etiology: Wound Location: Right, Anterior Lower Leg Secondary Diabetic Wound/Ulcer of the Lower Extremity Wounding Event: Trauma Etiology: [name], [name] ([id number]) [phone].pdf Page 9 of 10 Etiology: Date Acquired: 02/25/2023 Wound Open Weeks Of Treatment: 5 Status: Clustered Wound: No Comorbid Asthma, Sleep Apnea, Arrhythmia, Congestive Heart Failure, History: Hypertension, Type II Diabetes Photos Wound Measurements Length: (cm) 2 Width: (cm) 1.8 Depth: (cm) 0.1 Area: (cm) 2.827 Volume: (cm) 0.283 % Reduction in Area: -140% % Reduction in Volume: -19.9% Epithelialization: Small (1-33%) Tunneling: No Undermining: No Wound Description Classification: Full Thickness Without Exposed Support Structures Wound Margin: Distinct, outline attached Exudate Amount: Medium Exudate Type: Serosanguineous Exudate Color: red, brown Foul Odor After Cleansing: No Slough/Fibrino No Wound Bed Granulation Amount: Large (67-100%) Exposed Structure Granulation Quality: Red Fascia Exposed: No Necrotic Amount: None Present (0%) Fat Layer (Subcutaneous Tissue) Exposed: Yes Tendon Exposed: No Muscle Exposed: No Joint Exposed: No Bone Exposed: No Periwound Skin Texture Texture Color No Abnormalities  Noted: Yes No Abnormalities Noted: No Atrophie Blanche: No Moisture Cyanosis: No No Abnormalities Noted: Yes Ecchymosis: No Erythema: No Hemosiderin Staining: Yes Mottled: No Pallor: No Rubor: No Temperature / Pain Temperature: No Abnormality Treatment Notes Wound #15 (Lower Leg) Wound Laterality: Right, Anterior Cleanser Soap and Water Discharge Instruction: May shower and wash wound with dial  antibacterial soap and water prior to dressing change. Vashe 5.8 (oz) Discharge Instruction: Cleanse the wound with Vashe prior to applying a clean dressing using gauze sponges, not tissue or cotton balls. Peri-Wound Care Sween Lotion (Moisturizing lotion) Discharge Instruction: Apply moisturizing lotion as directed Topical Primary Dressing KIAI, PEACE ([id number]) [id number] Page 10 of 10 PolyMem Silver Non-Adhesive Dressing, 4.25x4.25 in Discharge Instruction: Apply to wound bed as instructed Secondary Dressing ABD Pad, 8x10 Discharge Instruction: Apply over primary dressing as directed. Secured With Compression Wrap Urgo K2, (equivalent to a 4 layer) two layer compression system, regular Discharge Instruction: Apply Urgo K2 as directed (alternative to 4 layer compression). Compression Stockings Add-Ons Electronic Signature(s) Signed: 06/03/2023 5:28:18 PM By: Thayer Dallas Entered By: Thayer Dallas on 06/02/2023 14:24:32 -------------------------------------------------------------------------------- Vitals Details Patient Name: Date of Service: [name] Asper NDY J. 06/02/2023 2:30 PM Medical Record Number: [id number] Patient Account Number: [id number] Date of Birth/Sex: Treating RN: Jun 19, 1955 (67 y.o. M) Primary Care Jacia Sickman: Hillard Danker Other Clinician: Referring Armanie Martine: Treating Flynn Gwyn/Extender: Shara Blazing in Treatment: 13 Vital Signs Time Taken: 14:21 Temperature (F): 98.1 Height (in): 73 Pulse (bpm): 70 Weight (lbs): 254 Respiratory Rate (breaths/min): 18 Body Mass Index (BMI): 33.5 Blood Pressure (mmHg): 112/78 Reference Range: 80 - 120 mg / dl Electronic Signature(s) Signed: 06/03/2023 5:28:18 PM By: Thayer Dallas Entered By: Thayer Dallas on 06/02/2023 14:22:12

## 2023-06-04 NOTE — Addendum Note (Signed)
Addended by: Pollie Meyer on: 06/04/2023 01:37 PM   Modules accepted: Orders

## 2023-06-04 NOTE — Patient Instructions (Signed)
Please continue: - Jardiance 10 mg before b'fast - Ozempic 0.5 mg weekly in a.m.  - Evaristo Bury U200 28 units daily - Novolog 8-12 units before a larger meal  Please continue Levothyroxine 50 mcg daily.  Take the thyroid hormone every day, with water, at least 30 minutes before breakfast, separated by at least 4 hours from: - acid reflux medications - calcium - iron - multivitamins  Please return in 6 months.

## 2023-06-04 NOTE — Telephone Encounter (Signed)
Patient needs a PA for [employer], insurance mailed him a Physicist, medical.

## 2023-06-05 ENCOUNTER — Ambulatory Visit: Payer: Medicare HMO | Attending: Cardiology

## 2023-06-05 DIAGNOSIS — I4891 Unspecified atrial fibrillation: Secondary | ICD-10-CM

## 2023-06-05 DIAGNOSIS — I513 Intracardiac thrombosis, not elsewhere classified: Secondary | ICD-10-CM

## 2023-06-05 DIAGNOSIS — Z5181 Encounter for therapeutic drug level monitoring: Secondary | ICD-10-CM

## 2023-06-05 LAB — MICROALBUMIN / CREATININE URINE RATIO
Creatinine, Urine: 84 mg/dL (ref 20–320)
Microalb Creat Ratio: 32 mg/g{creat} — ABNORMAL HIGH (ref <30)
Microalb, Ur: 2.7 mg/dL

## 2023-06-05 LAB — POCT INR: INR: 2.6 (ref 2.0–3.0)

## 2023-06-05 NOTE — Patient Instructions (Signed)
Continue taking Warfarin 1 tablet daily except for 1.5 tablets on Tuesday, and Thursday.  Recheck INR in 6 weeks. [facility] [phone].

## 2023-06-08 ENCOUNTER — Other Ambulatory Visit (HOSPITAL_COMMUNITY): Payer: Self-pay

## 2023-06-08 ENCOUNTER — Telehealth: Payer: Self-pay | Admitting: Pharmacy Technician

## 2023-06-08 NOTE — Telephone Encounter (Signed)
Pharmacy Patient Advocate Encounter   Received notification from Pt Calls Messages that prior authorization for Ozempic 0.5mg  is required/requested.   Insurance verification completed.   The patient is insured through [employer] .   Per test claim: PA started, but Key: [id number] responded "Available without authorization."  Will have to wait until after the new year starts to request PA if it's required.

## 2023-06-08 NOTE — Telephone Encounter (Signed)
Tried PA, but it's available currently without PA. Will have to wait til the year starts to request PA.

## 2023-06-08 NOTE — Telephone Encounter (Signed)
Spoke with pt. The letter stated that he needed a new PA for the new calendar year. He got a 2 month supply 12/14. Will need to renew prior to him running out.

## 2023-06-09 ENCOUNTER — Encounter (HOSPITAL_BASED_OUTPATIENT_CLINIC_OR_DEPARTMENT_OTHER): Payer: Medicare HMO | Admitting: Internal Medicine

## 2023-06-09 DIAGNOSIS — L97818 Non-pressure chronic ulcer of other part of right lower leg with other specified severity: Secondary | ICD-10-CM | POA: Diagnosis not present

## 2023-06-09 DIAGNOSIS — I480 Paroxysmal atrial fibrillation: Secondary | ICD-10-CM | POA: Diagnosis not present

## 2023-06-09 DIAGNOSIS — L97322 Non-pressure chronic ulcer of left ankle with fat layer exposed: Secondary | ICD-10-CM | POA: Diagnosis not present

## 2023-06-09 DIAGNOSIS — L97828 Non-pressure chronic ulcer of other part of left lower leg with other specified severity: Secondary | ICD-10-CM | POA: Diagnosis not present

## 2023-06-09 DIAGNOSIS — I87332 Chronic venous hypertension (idiopathic) with ulcer and inflammation of left lower extremity: Secondary | ICD-10-CM | POA: Diagnosis not present

## 2023-06-09 DIAGNOSIS — I428 Other cardiomyopathies: Secondary | ICD-10-CM | POA: Diagnosis not present

## 2023-06-09 DIAGNOSIS — I110 Hypertensive heart disease with heart failure: Secondary | ICD-10-CM | POA: Diagnosis not present

## 2023-06-09 DIAGNOSIS — L97328 Non-pressure chronic ulcer of left ankle with other specified severity: Secondary | ICD-10-CM | POA: Diagnosis not present

## 2023-06-09 DIAGNOSIS — I5022 Chronic systolic (congestive) heart failure: Secondary | ICD-10-CM | POA: Diagnosis not present

## 2023-06-09 DIAGNOSIS — E11622 Type 2 diabetes mellitus with other skin ulcer: Secondary | ICD-10-CM | POA: Diagnosis not present

## 2023-06-09 DIAGNOSIS — S80811A Abrasion, right lower leg, initial encounter: Secondary | ICD-10-CM | POA: Diagnosis not present

## 2023-06-09 NOTE — Progress Notes (Signed)
AIRON, SHIFFLETT ([id number]) [id number] Page 1 of 10 Visit Report for 06/09/2023 HPI Details Patient Name: Date of Service: [name], [name] 06/09/2023 10:30 A M Medical Record Number: [id number] Patient Account Number: [id number] Date of Birth/Sex: Treating RN: 1955-09-06 (67 y.o. M) Primary Care Provider: Hillard Danker Other Clinician: Referring Provider: Treating Provider/Extender: Shara Blazing in Treatment: 14 History of Present Illness HPI Description: ADMISSION 07/29/2018 Mr. Wool is a 67 year old man with either prediabetes or diabetes he is on glipizide. In late October to November 2019 he noted a scabbed area on the back of his right calf. He picked this off a few times but it would not heal. He saw his primary physician on 12/5. It was felt at that time that this may actually heal on its own with conservative management. The next visit was on 07/20/2018 noted the area was a lot worse and arranged for his treatment here. He has been topical antibiotics like Neosporin although he stopped using this when the wound looked worse. He is just been covering this with a clean Band-Aid. He is given Bactrim a week ago and he is finishing this currently. It is made some improvement in the surrounding erythema per the patient. He does not have a history of nonhealing wounds. No prior history of wounds on his legs that he had difficulty healing. No prior skin issues. 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 Past medical history; A. fib status post ablation and recent cardioversion he is on Xarelto. He has a history of systolic heart failure, cervical radiculopathy, cardiomyopathy and type 2 diabetes as discussed ABI in the right leg was noncompressible on the right 2/20; the biopsy I did on the patient last week was negative for malignancy. Culture grew Pseudomonas and methicillin sensitive staph aureus. He is  on cefdinir 300 twice a day. I will have to make this a 10 day course. He put him in compression. He states that the redness pain and erythema are a lot better. I suspect the patient has chronic venous insufficiency probably with a secondary cellulitis 2/27; arrives today with copious amounts of drainage coming out of the wound irritating the skin below the wound area. He only renewed the final 3 days of cefdinir today 3/5; came in for a nurse change 3 days ago. Again a lot of drainage noted. Zinc oxide was applied unfortunately the drainage appears to a pool then he has a string of superficial open areas extending down into the Achilles area and some just below the wound. The wound itself does not look too bad. He is completed the antibiotics although apparently there was separation from the original 7 with the last 3 days. Culture grew a few MSSA and a few Pseudomonas 3/12; not too much difference over the last week. He came in for a dressing change on Monday by our nursing staff. Necrotic debris again over the wound surface. The entire area looks irritated but nontender and I do not think shows obvious evidence of infection. We have not heard anything about the reflux studies. Also notable than in this diabetic man we had noncompressible vessels and although I can feel his pulses easily in his feet I will order arterial studies as well. 3/19-Patient had experience more pain has been dressed with a silver alginate, the wounds all have necrotic debris, very friable with easy bleeding with any kind of surface debridement including with gauze and Anasept and with a #3 curette. His  vascular appointments unfortunately were all canceled on account of the virus outbreak resulting in studies only being done for emergent cases. His pulses are easily palpable in the lower extremity. He has open areas below the primary wound where he states the drainage which included purulent material also with some blood  pooled and caused breakdown. 3/26; the patient was changed to Santyl with [facility] backing last week. This was largely because the silver alginate was sticking to the wound. He is still having quite a bit of pain. He tells [place] that the reflux studies and arterial studies we had attempted to arrange through vein and vascular are not being booked until mid May. Culture that was done last week showed both Serratia moderate and a few methicillin sensitive staph aureus I started him on Bactrim DS 1 p.o. twice daily on 3/23 for 7 days. He is here for follow-up. 4/3; patient is on Santyl with Hydrofera Blue. Patient complains of pain. Our intake nurse is noted drainage and some odor. He has completed Bactrim recently for Serratia and a few methicillin sensitive staph aureus. After our staff push hard last week we were able to get venous studies as well as arterial studies. His arterial studies showed on the right great toe pressure of 0.86. On the left ABI at 1.47 TBI of 0.87. On both sides waveforms were triphasic VENOUS STUDIES showed reflux in the femoral vein, popliteal vein great saphenous vein at the saphenofemoral junction and great saphenous vein at the proximal thigh. Also noted to have age-indeterminate superficial vein thrombosis involving the small saphenous vein. Notable that the patient is already on Xarelto for atrial fibrillation. 4/9; the patient has been to see vascular surgery and reviewed by Dr. Durwin Nora. He was felt to have only minimal superficial venous reflux in the right great greater saphenous vein. For this reason he was not felt to be a candidate for laser ablation of the right greater saphenous vein. A wedge cushion was suggested to keep his leg elevated. He does have deep venous reflux on the right. Culture I did last time showed a combination of Serratia Pseudomonas and methicillin sensitive staph aureus. We have been using Hydrofera Blue to the large wound, Santyl to some of  the deep punched out satellite lesions distal to it. There is some improvement 4/16; the application for Apligraf was put out for further review for material that we have resubmitted. He has the deep area on the right posterior calf which is large and then 3 small punched out areas beneath this. In general his wounds look a lot better 4/23; the patient has his large original wound and then 2 punched out satellite lesions below it on the right posterior calf. I was usually able to use Apligraf #1 to cover the full surface area of the larger wound. We continued with Santyl and Hydrofera Blue on the 2 punched-out areas 5/7; patient has his large original wound and 2 punched out satellite lesions below it in the right posterior calf. Apligraf #2 today. Major improvement in the big wound in terms of wound depth. Punched-out [place] wounds have a very healthy looking wound bed. 5/21-Patient comes back for his large right posterior calf wound for which she has been an Apligraf #3 today. Wound depth seems to be better, the punched-out wounds appear to have healthy bed with some bleeding 6/4; right posterior calf wound is much better. Apligraf #4 today. The major wound has no wound depth at all. Only 1 of the  satellite lesions is still open. The patient has very high blood pressure coming in today with a diastolic blood pressure of 130 after lying there for a few minutes it was down to 110. He states that is running 100 210 diastolic at home. He was high last week as well I thought he was on 50 mg 1/2 tablet twice daily of losartan I told him to double up on that however it turns out he is actually on 50 twice daily. Course he is run out of his medications prematurely. He has a follow-up with his primary's office next Wednesday. CAMDYN, BLOEMKER ([id number]) [id number] Page 2 of 10 6/18; patient's wounds continue to improve. The small area laterally is just about closed and there is  continued epithelialization on the area on the posterior calf. 7/2; we applied his last Apligraf last visit. Early the next week there was a lot of purulent looking drainage that I cultured this grew staph and Pseudomonas however when we had him back for the next visit to 3 days later everything looked a lot better. He did not receive systemic antibiotics. Fortunately we did not have to remove the Apligraf. He has had heart trouble this week he was having an ablation apparently they have discovered a clot in his left atrium they have changed all his anticoagulants as well as his blood pressure medication. 7/16; using Hydrofera Blue. We are making nice progress towards closure. He has not yet ordered his compression stockings he has his measurements 7/23; dimensions are better. He has a new satellite area superiorly. Both wounds cauterized with silver nitrate. 7/30; absolutely no change in the major wound on the posterior part of the right calf. We have been using Hydrofera Blue for a prolonged period of time and really had a nice improvement but over the last few weeks this is stalled. There is no depth. He arrived in clinic today with a new area on the right anterior tibia which apparently was an "ingrown hair". It is clearly an open area. This looks like a wrap injury although he was not really aware of it. 8/31; since the patient was last in clinic he was admitted to hospital from 8/17 through 8/22. He had had multiple falls at home including one off a ladder. He was admitted to hospital with mild COVID-19 viral pneumonia. Noted to be hyperkalemic and in acute renal failure. He has chronic systolic heart failure with ejection fraction of 35%. He arrives back in clinic with the original wound on the posterior aspect of the right lower calf looking a lot better. He has eschared areas from falls and lacerations on his anterior knees bilaterally just below the patella and on the mid part of his tibia  bilaterally. He seems to have a small area on the right lateral ankle as well 9/10- Patient comes to clinic with a 2 new wounds one on the left anterior shin and one on the right posterior leg both the left knee and right knee areas are healed up. We have been using Santyl to the left anterior leg and silver alginate to right posterior leg wounds. 9/17; the patient is original wound looks good on the posterior right calf. He has traumatic areas on the left anterior shin left anterior patellar tendon and the right mid tibia area. Except for the right posterior calf all required debridement. We have been using Santyl on these areas and silver alginate posteriorly right calf 9/24; the original wound on the posterior calf on  the right looks good. This is healthy. There are traumatic wounds in the left anterior shin, left anterior patella in the right mid tibia area are about the same. He has dusky erythema on the left anterior tibia which led me to give him antibiotics last week. This does not look too much different. This is nontender and I wonder if this is all just venous inflammation. 10/2; the patient has 2 open areas on the right posterior calf both of these look good we have been using silver alginate. He has traumatic wounds on the left and right mid tibia areas. The surface of these wounds is still not ready for closure. We have been using Iodoflex. Finally has areas on the left knee. The latter wounds are all traumatic after a fall 10/9. His original wounds of the right posterior calf are superficial and progressing towards closure. His traumatic wounds on the left anterior tibia and right anterior tibia are considerably improved in terms of the wound surface. He asked me about a skin graft in these areas I do not think this is going to be necessary. Change from Iodosorb to [facility]. The area on the left knee is just about closed as well 10/16; his original wound on the right posterior  calf very superficial looks healthy. He has more recent traumatic wounds on the left anterior tibia and right anterior tibia both of these have better looking surfaces and measuring smaller. Might be beneficial for an advanced treatment product. The area on his left knee has a small superficial scab and we are going to close that when 10/23; his original wound areas on the right posterior calf continue to improve. The more recently traumatic wounds on the left anterior tibia and right anterior tibia both look better in terms of surfaces. No debridement required. We have been using Hydrofera Blue. He is approved for Apligraf 11/3; Apligraf #1 applied to the left anterior tibia right anterior tibia and to 2 small areas remaining on the right posterior calf which were part of his initial wound area. 11/19; Apligraf #2. Left anterior tibia is healed. Right anterior tibia much better. Only one small area remains on the right posterior calf which was part of his initial wound area 12/3; the left anterior tibia has opened up again. Hyper granulated nodule. Right anterior tibia is superficial and larger. There is no depth of this. I did not place Apligraf today return to [facility] under compression 12/10; the patient has 2 remaining wounds 1 on the right posterior and the other on the left anterior. Both of these look quite good. No debridement is required we have been using Hydrofera Blue under compression. 12/17; the patient has really not closed over which is disappointing. His wraps were very tight and there may be some wrap injury issues. On the left he has the original wound on the medial mid tibia he has a wrap injury on the lateral mid tibia. On the right his posterior areas are superficial but certainly not closed 12/31; the patient has totally closed on the left. He will transition to his own 20/30 below-knee stocking on the left leg today. Interestingly on the right posterior calf the 2  wounds that he had from last time of closed however there is an additional injury in this same area. Almost looks as though there is subcutaneous fluid/blistering. There is no evidence of cellulitis this does not seem tender 06/24/2019; the patient comes in having been placed in his 20/30 stockings on  the left leg last week. He states that he noted blisters break open on Saturday and they had opened into wounds on the anterior tibial area on the left by Monday. The area on the posterior right leg looks satisfactory we are still doing that and compression. We are using calcium alginate to all wound areas 1/14; the patient had juxta light however one of them was not the right size which will need to be returned. The new area last week on the left anterior tibia looks a lot better it measures smaller I would say the area on the right is about the same, this is on the right posterior calf. We have been using silver alginate 1/21; he has his bilateral juxta lites however the left anterior wound continues to look better where is the 2 areas on the right posterior calf look about the same. We have been using Hydrofera Blue under compression with the juxta lite stockings now on standby 2/4; the areas on the left are totally healed and he will be discharged into his own stocking/juxta light. On the right posterior calf he has what looks to be a blister with a small area of subdermal hemorrhage this is very tiny but I did I am going to wrap him today and see if this will be reabsorbed by next week 2/11; he comes in this week with expanded wounds on the posterior calf as well as a new wound on the left anterior. He said the left anterior started a few days ago with a blister that is now surface. He is using his juxta lite stockings at a pressure of 30/40 2/19; we wrapped his left leg last week after a failed attempt to discharge that leg and his juxta lites 2 weeks ago. The leg is again healed he has  superficial areas posteriorly on the right leg 2/26; the patient came in initially with right posterior calf wound secondary to minor trauma with secondary infection. He went on to develop a left lower leg wound. In the interim he had congestive heart failure with acute renal failure a fall with bilateral anterior leg wounds and finally Covid requiring admission to pneumonia with pneumonia. He is finally closed. He has external compression stockings bilaterally [juxta lite stockings]. His wounds are healed READMISSION 03/03/2023 This is a patient that we had here for a year from 20 20 through 20 21 with a sizable wound on the right posterior calf which was secondary to a combination of chronic venous insufficiency and secondary infection in the wound when he came here. This was a problematic wound in itself however he developed traumatic wounds on his lower extremities after a fall from a ladder during the same admission. Eventually he was healed out and discharged and juxta lite stockings. He tells me that everything was fine up until about 2-1/2 months ago he developed a blister and an open area on the left posterior calf this time. He saw his primary doctor and received a course of Bactrim. He has been using Silvadene with AandE ointment and covering this with gauze. He is here for review of this area. When he was here in 2021 he also saw Dr. Woodfin Ganja of vein and vascular he had venous reflux studies that showed deep venous reflux but nothing that was amenable to surgery. He also had arterial studies. He was noncompressible on the left but had normal TBIs and triphasic waveforms. In terms of the reflux studies I am not really sure whether the  left leg was involved in that study or not we will need to review that. His past medical history includes type type 2 diabetes, chronic venous insufficiency with secondary lymphedema, intermittent A-fib, nonischemic cardiomyopathy, thrombus of the left  atrial appendage, hereditary hemochromatosis cytosis. He is now on Coumadin presumably for the atrial fibrillation. SAVOY, BIRCHARD ([id number]) [id number] Page 3 of 10 ABIs in this clinic were not repeated 9/24; posterior left calf wound. We used Hydrofera Blue under compression. The wound looks better this week better looking surface. 10/1. Posterior left calf wound secondary to venous insufficiency. He was new to the clinic in 2 weeks ago at which time I thought he was going to require a difficult set of mechanical debridements however the wound surface is continuing to improve using Hydrofera Blue although the dimensions not so remarkably improved today 10/8; posterior left calf wound secondary to chronic venous insufficiency. He continues to think nice progress here on this wound the wound is smaller better looking surface. He arrived in clinic today with an abrasion on his Achilles area on the left leg as well as what looked to be a potential deep tissue injury on the lateral lateral part of the left foot at the level of the base of the fifth metatarsal. 10/15 posterior left calf wound continues to improve. We have been using Prisma under ABDs under Urgo K2 lite's. The areas on the Achilles and the left lateral foot which were concerning last time of maintain skin integrity. He is not wearing stockings even on the right leg I talked to him about this. 10/22; posterior left calf wound measures slightly larger today. The abrasion injury in the Achilles send on the lateral fifth metatarsal base look improved. We have been using Prisma under compression I am going to change this to [facility] today 10/29; posterior left calf wound continues to progress nicely. Presumably an abrasion injury on the Achilles heel. I changed dressing to Hydrofera Blue. Will need to dress the area on the Achilles today. The area on the fifth metatarsal base is not open 11/5; posterior  left calf wound continues to improve nicely presumably an abrasion injury on the left Achilles also looks clean. He has a what looks to be an abrasion on the base of the fifth metatarsal it is not open but it looks as though there is been some friction on this area we will pad this today under the wrap. Patient with chronic venous insufficiency severe hemosiderin deposition and resultant wounds. He has been in the clinic before however that was for a wound on the right posterior calf. Notable he is not wearing stockings even on the uninvolved right leg today 11/11; his original wound on the posterior left calf continues to improve in dimensions. Once again he comes in today with some black eschar around the edge I wonder if this is bleeding. He has the area on the Achilles and now a new area on the left upper lower leg which he says he scratched. And finally he has an area on the right anterior mid tibia which he said he got from taking off his stocking 11/19; he has wounds on his bilateral lower legs. We have been wrapping with Urgo K2's. He has original wound on the left posterior calf, probably a wrap injury on the left lateral calf, area on the left Achilles, and finally the right medial lower leg. We have been using Hydrofera Blue 12/3; came in this with.a wound on his left posterior  leg. since then her has developed a more distal left wound and a right anterior. A wound of the left lateral has healed. We are using HFB with Urgo K2 12/10; the patient initially came in with a wound on his left posterior leg. This is healed today. He subsequently developed an area on the left Achilles heel and an area on the right anterior mid tibia area. We have been using Hydrofera Blue however intake reports that these are getting stuck to the wound. Also he has an area of painful erythema around the posterior left Achilles area. This could be rubbing in the wrap however I think coverage with antibiotics  for cellulitis is certainly indicated we will be changing the primary dressing to polymen silver 12/17; his left posterior leg wound which was his original wound has remained closed. However he subsequently developed an area on the left Achilles heel and the right anterior mid tibia. Last week the area on the Achilles heel had surrounding erythema I gave him a course of doxycycline and this seems to have resolved at least in terms of the erythema. He still however having discomfort in this wound. The area on the right anterior mid tibia looks really quite healthy. We have been polymen silver. 20/24; his original wound on the left posterior lower leg is closed he had a secondary wound just on the superior part of his Achilles tendon this looks better this week no erythema. His last wound is on the right anterior mid tibia. All of this looks quite good. We have been using polymen silver under compression. Electronic Signature(s) Signed: 06/09/2023 11:51:18 AM By: Baltazar Najjar MD Entered By: Baltazar Najjar on 06/09/2023 07:53:44 -------------------------------------------------------------------------------- Physical Exam Details Patient Name: Date of Service: [name] 06/09/2023 10:30 A M Medical Record Number: [id number] Patient Account Number: [id number] Date of Birth/Sex: Treating RN: [birth year] (67 y.o. M) Primary Care Provider: Hillard Danker Other Clinician: Referring Provider: Treating Provider/Extender: Shara Blazing in Treatment: 14 Constitutional Sitting or standing Blood Pressure is within target range for patient.. Pulse regular and within target range for patient.[name] Respirations regular, non-labored and within target range.. Temperature is normal and within the target range for the patient.[name] Appears in no distress. Notes Wound exam; left posterior calf which was his original wound is eschared over but I do not think there is anything  open here. At the superior portion of his Achilles tendon was a secondary wound. This looks quite good. There is no erythema around it. I suspect he had a cellulitis here which is resolved with the antibiotics I gave him. No debridement is required Area on the right anterior mid tibia lower leg really likes quite healthy and smaller. No debridement YVES, DISANTIS ([id number]) [phone].pdf Page 4 of 10 Electronic Signature(s) Signed: 06/09/2023 11:51:18 AM By: Baltazar Najjar MD Entered By: Baltazar Najjar on 06/09/2023 07:54:54 -------------------------------------------------------------------------------- Physician Orders Details Patient Name: Date of Service: [name] 06/09/2023 10:30 A M Medical Record Number: [id number] Patient Account Number: [id number] Date of Birth/Sex: Treating RN: 02-Jan-1956 (67 y.o. [name] Primary Care Provider: Hillard Danker Other Clinician: Referring Provider: Treating Provider/Extender: Shara Blazing in Treatment: 14 The following information was scribed by: Shawn Stall The information was scribed for: Baltazar Najjar Verbal / Phone Orders: No Diagnosis Coding Follow-up Appointments ppointment in 2 weeks. - Dr .Mikey Bussing (front office to schedule) Return A Return appointment in 3 weeks. - Dr .Mikey Bussing (front office to schedule) Nurse Visit: -  06/16/23 215pm (already scheduled) Anesthetic (In clinic) Topical Lidocaine 4% applied to wound bed Bathing/ Shower/ Hygiene May shower with protection but do not get wound dressing(s) wet. Protect dressing(s) with water repellant cover (for example, large plastic bag) or a cast cover and may then take shower. Edema Control - Orders / Instructions Elevate legs to the level of the heart or above for 30 minutes daily and/or when sitting for 3-4 times a day throughout the day. Avoid standing for long periods of time. Exercise  regularly Wound Treatment Wound #14 - Achilles Wound Laterality: Left, Posterior Cleanser: Soap and Water 1 x Per Week/15 Days Discharge Instructions: May shower and wash wound with dial antibacterial soap and water prior to dressing change. Cleanser: Vashe 5.8 (oz) 1 x Per Week/15 Days Discharge Instructions: Cleanse the wound with Vashe prior to applying a clean dressing using gauze sponges, not tissue or cotton balls. Peri-Wound Care: Sween Lotion (Moisturizing lotion) 1 x Per Week/15 Days Discharge Instructions: Apply moisturizing lotion as directed Prim Dressing: PolyMem Silver Non-Adhesive Dressing, 4.25x4.25 in 1 x Per Week/15 Days ary Discharge Instructions: Apply to wound bed as instructed Secondary Dressing: ABD Pad, 8x10 1 x Per Week/15 Days Discharge Instructions: Apply over primary dressing as directed. Compression Wrap: Urgo K2, (equivalent to a 4 layer) two layer compression system, regular 1 x Per Week/15 Days Discharge Instructions: Apply Urgo K2 as directed (alternative to 4 layer compression). Wound #15 - Lower Leg Wound Laterality: Right, Anterior Cleanser: Soap and Water 1 x Per Week/15 Days Discharge Instructions: May shower and wash wound with dial antibacterial soap and water prior to dressing change. Cleanser: Vashe 5.8 (oz) 1 x Per Week/15 Days Discharge Instructions: Cleanse the wound with Vashe prior to applying a clean dressing using gauze sponges, not tissue or cotton balls. Peri-Wound Care: Sween Lotion (Moisturizing lotion) 1 x Per Week/15 Days Discharge Instructions: Apply moisturizing lotion as directed Prim Dressing: PolyMem Silver Non-Adhesive Dressing, 4.25x4.25 in 1 x Per Week/15 Days ary Discharge Instructions: Apply to wound bed as instructed Secondary Dressing: ABD Pad, 8x10 1 x Per Week/15 Days TALIB, JOHANNESSEN ([id number]) [phone].pdf Page 5 of 10 Discharge Instructions: Apply over primary dressing as  directed. Compression Wrap: Urgo K2, (equivalent to a 4 layer) two layer compression system, regular 1 x Per Week/15 Days Discharge Instructions: Apply Urgo K2 as directed (alternative to 4 layer compression). Electronic Signature(s) Signed: 06/09/2023 11:51:18 AM By: Baltazar Najjar MD Signed: 06/09/2023 12:25:14 PM By: Shawn Stall RN, BSN Entered By: Shawn Stall on 06/09/2023 07:48:00 -------------------------------------------------------------------------------- Problem List Details Patient Name: Date of Service: [name] 06/09/2023 10:30 A M Medical Record Number: [id number] Patient Account Number: [id number] Date of Birth/Sex: Treating RN: Mar 12, 1956 (67 y.o. 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 with other specified severity 04/14/2023 No Yes L97.818 Non-pressure chronic ulcer of other part of right lower leg with other specified 04/27/2023 No Yes severity E11.622 Type 2 diabetes mellitus with other skin ulcer 03/03/2023 No Yes Inactive Problems ICD-10 Code Description Active Date Inactive Date L97.828 Non-pressure chronic ulcer of other part of left lower leg with other specified severity 03/03/2023 03/03/2023 L03.116 Cellulitis of left lower limb 05/26/2023 05/26/2023 Resolved Problems Electronic Signature(s) Signed: 06/09/2023 11:51:18 AM By:  Baltazar Najjar MD Entered By: Baltazar Najjar on 06/09/2023 07:52:36 [name], Dalene Seltzer ([id number]) [id number] Page 6 of 10 -------------------------------------------------------------------------------- Progress Note  Details Patient Name: Date of Service: KENWOOD, LAWHUN 06/09/2023 10:30 A M Medical Record Number: [id number] Patient Account Number: [id number] Date of Birth/Sex: Treating RN: 1955-07-28 (67 y.o. M) Primary Care Provider: Hillard Danker Other Clinician: Referring Provider: Treating Provider/Extender: Shara Blazing in Treatment: 14 Subjective History of Present Illness (HPI) ADMISSION 07/29/2018 Mr. Mowad is a 67 year old man with either prediabetes or diabetes he is on glipizide. In late October to November 2019 he noted a scabbed area on the back of his right calf. He picked this off a few times but it would not heal. He saw his primary physician on 12/5. It was felt at that time that this may actually heal on its own with conservative management. The next visit was on 07/20/2018 noted the area was a lot worse and arranged for his treatment here. He has been topical antibiotics like Neosporin although he stopped using this when the wound looked worse. He is just been covering this with a clean Band-Aid. He is given Bactrim a week ago and he is finishing this currently. It is made some improvement in the surrounding erythema per the patient. He does not have a history of nonhealing wounds. No prior history of wounds on his legs that he had difficulty healing. No prior skin issues. 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 Past medical history; A. fib status post ablation and recent cardioversion he is on Xarelto. He has a history of systolic heart failure, cervical radiculopathy, cardiomyopathy and type 2 diabetes as discussed ABI in the right leg was noncompressible on the right 2/20; the biopsy I did on the patient last week was negative for malignancy. Culture grew Pseudomonas and methicillin sensitive staph aureus. He is on cefdinir 300 twice a day. I will have to make this a 10 day course. He put him in compression. He states that the  redness pain and erythema are a lot better. I suspect the patient has chronic venous insufficiency probably with a secondary cellulitis 2/27; arrives today with copious amounts of drainage coming out of the wound irritating the skin below the wound area. He only renewed the final 3 days of cefdinir today 3/5; came in for a nurse change 3 days ago. Again a lot of drainage noted. Zinc oxide was applied unfortunately the drainage appears to a pool then he has a string of superficial open areas extending down into the Achilles area and some just below the wound. The wound itself does not look too bad. He is completed the antibiotics although apparently there was separation from the original 7 with the last 3 days. Culture grew a few MSSA and a few Pseudomonas 3/12; not too much difference over the last week. He came in for a dressing change on Monday by our nursing staff. Necrotic debris again over the wound surface. The entire area looks irritated but nontender and I do not think shows obvious evidence of infection. We have not heard anything about the reflux studies. Also notable than in this diabetic man we had noncompressible vessels and although I can feel his pulses easily in his feet I will order arterial studies as well. 3/19-Patient had experience more pain has been dressed with a silver alginate, the wounds all have necrotic debris, very friable with easy bleeding with any kind of surface debridement including with  gauze and Anasept and with a #3 curette. His vascular appointments unfortunately were all canceled on account of the virus outbreak resulting in studies only being done for emergent cases. His pulses are easily palpable in the lower extremity. He has open areas below the primary wound where he states the drainage which included purulent material also with some blood pooled and caused breakdown. 3/26; the patient was changed to Santyl with [facility] backing last week. This was  largely because the silver alginate was sticking to the wound. He is still having quite a bit of pain. He tells [place] that the reflux studies and arterial studies we had attempted to arrange through vein and vascular are not being booked until mid May. Culture that was done last week showed both Serratia moderate and a few methicillin sensitive staph aureus I started him on Bactrim DS 1 p.o. twice daily on 3/23 for 7 days. He is here for follow-up. 4/3; patient is on Santyl with Hydrofera Blue. Patient complains of pain. Our intake nurse is noted drainage and some odor. He has completed Bactrim recently for Serratia and a few methicillin sensitive staph aureus. After our staff push hard last week we were able to get venous studies as well as arterial studies. His arterial studies showed on the right great toe pressure of 0.86. On the left ABI at 1.47 TBI of 0.87. On both sides waveforms were triphasic VENOUS STUDIES showed reflux in the femoral vein, popliteal vein great saphenous vein at the saphenofemoral junction and great saphenous vein at the proximal thigh. Also noted to have age-indeterminate superficial vein thrombosis involving the small saphenous vein. Notable that the patient is already on Xarelto for atrial fibrillation. 4/9; the patient has been to see vascular surgery and reviewed by Dr. Durwin Nora. He was felt to have only minimal superficial venous reflux in the right great greater saphenous vein. For this reason he was not felt to be a candidate for laser ablation of the right greater saphenous vein. A wedge cushion was suggested to keep his leg elevated. He does have deep venous reflux on the right. Culture I did last time showed a combination of Serratia Pseudomonas and methicillin sensitive staph aureus. We have been using Hydrofera Blue to the large wound, Santyl to some of the deep punched out satellite lesions distal to it. There is some improvement 4/16; the application for Apligraf  was put out for further review for material that we have resubmitted. He has the deep area on the right posterior calf which is large and then 3 small punched out areas beneath this. In general his wounds look a lot better 4/23; the patient has his large original wound and then 2 punched out satellite lesions below it on the right posterior calf. I was usually able to use Apligraf #1 to cover the full surface area of the larger wound. We continued with Santyl and Hydrofera Blue on the 2 punched-out areas 5/7; patient has his large original wound and 2 punched out satellite lesions below it in the right posterior calf. Apligraf #2 today. Major improvement in the big wound in terms of wound depth. Punched-out [place] wounds have a very healthy looking wound bed. 5/21-Patient comes back for his large right posterior calf wound for which she has been an Apligraf #3 today. Wound depth seems to be better, the punched-out wounds appear to have healthy bed with some bleeding 6/4; right posterior calf wound is much better. Apligraf #4 today. The major wound has  no wound depth at all. Only 1 of the satellite lesions is still open. The patient has very high blood pressure coming in today with a diastolic blood pressure of 130 after lying there for a few minutes it was down to 110. He states that is running 100 210 diastolic at home. He was high last week as well I thought he was on 50 mg 1/2 tablet twice daily of losartan I told him to double up on that however it turns out he is actually on 50 twice daily. Course he is run out of his medications prematurely. He has a follow-up with his primary's office next Wednesday. 6/18; patient's wounds continue to improve. The small area laterally is just about closed and there is continued epithelialization on the area on the posterior calf. 7/2; we applied his last Apligraf last visit. Early the next week there was a lot of purulent looking drainage that I cultured this  grew staph and Pseudomonas however when we had him back for the next visit to 3 days later everything looked a lot better. He did not receive systemic antibiotics. Fortunately we did not have to remove the Apligraf. XAIDYN, WENNER ([id number]) [id number] Page 7 of 10 He has had heart trouble this week he was having an ablation apparently they have discovered a clot in his left atrium they have changed all his anticoagulants as well as his blood pressure medication. 7/16; using Hydrofera Blue. We are making nice progress towards closure. He has not yet ordered his compression stockings he has his measurements 7/23; dimensions are better. He has a new satellite area superiorly. Both wounds cauterized with silver nitrate. 7/30; absolutely no change in the major wound on the posterior part of the right calf. We have been using Hydrofera Blue for a prolonged period of time and really had a nice improvement but over the last few weeks this is stalled. There is no depth. He arrived in clinic today with a new area on the right anterior tibia which apparently was an "ingrown hair". It is clearly an open area. This looks like a wrap injury although he was not really aware of it. 8/31; since the patient was last in clinic he was admitted to hospital from 8/17 through 8/22. He had had multiple falls at home including one off a ladder. He was admitted to hospital with mild COVID-19 viral pneumonia. Noted to be hyperkalemic and in acute renal failure. He has chronic systolic heart failure with ejection fraction of 35%. He arrives back in clinic with the original wound on the posterior aspect of the right lower calf looking a lot better. He has eschared areas from falls and lacerations on his anterior knees bilaterally just below the patella and on the mid part of his tibia bilaterally. He seems to have a small area on the right lateral ankle as well 9/10- Patient comes to clinic  with a 2 new wounds one on the left anterior shin and one on the right posterior leg both the left knee and right knee areas are healed up. We have been using Santyl to the left anterior leg and silver alginate to right posterior leg wounds. 9/17; the patient is original wound looks good on the posterior right calf. He has traumatic areas on the left anterior shin left anterior patellar tendon and the right mid tibia area. Except for the right posterior calf all required debridement. We have been using Santyl on these areas and silver alginate posteriorly right calf  9/24; the original wound on the posterior calf on the right looks good. This is healthy. There are traumatic wounds in the left anterior shin, left anterior patella in the right mid tibia area are about the same. He has dusky erythema on the left anterior tibia which led me to give him antibiotics last week. This does not look too much different. This is nontender and I wonder if this is all just venous inflammation. 10/2; the patient has 2 open areas on the right posterior calf both of these look good we have been using silver alginate. He has traumatic wounds on the left and right mid tibia areas. The surface of these wounds is still not ready for closure. We have been using Iodoflex. Finally has areas on the left knee. The latter wounds are all traumatic after a fall 10/9. His original wounds of the right posterior calf are superficial and progressing towards closure. His traumatic wounds on the left anterior tibia and right anterior tibia are considerably improved in terms of the wound surface. He asked me about a skin graft in these areas I do not think this is going to be necessary. Change from Iodosorb to [facility]. The area on the left knee is just about closed as well 10/16; his original wound on the right posterior calf very superficial looks healthy. He has more recent traumatic wounds on the left anterior tibia and  right anterior tibia both of these have better looking surfaces and measuring smaller. Might be beneficial for an advanced treatment product. The area on his left knee has a small superficial scab and we are going to close that when 10/23; his original wound areas on the right posterior calf continue to improve. The more recently traumatic wounds on the left anterior tibia and right anterior tibia both look better in terms of surfaces. No debridement required. We have been using Hydrofera Blue. He is approved for Apligraf 11/3; Apligraf #1 applied to the left anterior tibia right anterior tibia and to 2 small areas remaining on the right posterior calf which were part of his initial wound area. 11/19; Apligraf #2. Left anterior tibia is healed. Right anterior tibia much better. Only one small area remains on the right posterior calf which was part of his initial wound area 12/3; the left anterior tibia has opened up again. Hyper granulated nodule. Right anterior tibia is superficial and larger. There is no depth of this. I did not place Apligraf today return to [facility] under compression 12/10; the patient has 2 remaining wounds 1 on the right posterior and the other on the left anterior. Both of these look quite good. No debridement is required we have been using Hydrofera Blue under compression. 12/17; the patient has really not closed over which is disappointing. His wraps were very tight and there may be some wrap injury issues. On the left he has the original wound on the medial mid tibia he has a wrap injury on the lateral mid tibia. On the right his posterior areas are superficial but certainly not closed 12/31; the patient has totally closed on the left. He will transition to his own 20/30 below-knee stocking on the left leg today. Interestingly on the right posterior calf the 2 wounds that he had from last time of closed however there is an additional injury in this same area. Almost  looks as though there is subcutaneous fluid/blistering. There is no evidence of cellulitis this does not seem tender 06/24/2019; the patient comes  in having been placed in his 20/30 stockings on the left leg last week. He states that he noted blisters break open on Saturday and they had opened into wounds on the anterior tibial area on the left by Monday. The area on the posterior right leg looks satisfactory we are still doing that and compression. We are using calcium alginate to all wound areas 1/14; the patient had juxta light however one of them was not the right size which will need to be returned. The new area last week on the left anterior tibia looks a lot better it measures smaller I would say the area on the right is about the same, this is on the right posterior calf. We have been using silver alginate 1/21; he has his bilateral juxta lites however the left anterior wound continues to look better where is the 2 areas on the right posterior calf look about the same. We have been using Hydrofera Blue under compression with the juxta lite stockings now on standby 2/4; the areas on the left are totally healed and he will be discharged into his own stocking/juxta light. On the right posterior calf he has what looks to be a blister with a small area of subdermal hemorrhage this is very tiny but I did I am going to wrap him today and see if this will be reabsorbed by next week 2/11; he comes in this week with expanded wounds on the posterior calf as well as a new wound on the left anterior. He said the left anterior started a few days ago with a blister that is now surface. He is using his juxta lite stockings at a pressure of 30/40 2/19; we wrapped his left leg last week after a failed attempt to discharge that leg and his juxta lites 2 weeks ago. The leg is again healed he has superficial areas posteriorly on the right leg 2/26; the patient came in initially with right posterior calf wound  secondary to minor trauma with secondary infection. He went on to develop a left lower leg wound. In the interim he had congestive heart failure with acute renal failure a fall with bilateral anterior leg wounds and finally Covid requiring admission to pneumonia with pneumonia. He is finally closed. He has external compression stockings bilaterally [juxta lite stockings]. His wounds are healed READMISSION 03/03/2023 This is a patient that we had here for a year from 20 20 through 20 21 with a sizable wound on the right posterior calf which was secondary to a combination of chronic venous insufficiency and secondary infection in the wound when he came here. This was a problematic wound in itself however he developed traumatic wounds on his lower extremities after a fall from a ladder during the same admission. Eventually he was healed out and discharged and juxta lite stockings. He tells me that everything was fine up until about 2-1/2 months ago he developed a blister and an open area on the left posterior calf this time. He saw his primary doctor and received a course of Bactrim. He has been using Silvadene with AandE ointment and covering this with gauze. He is here for review of this area. When he was here in 2021 he also saw Dr. Woodfin Ganja of vein and vascular he had venous reflux studies that showed deep venous reflux but nothing that was amenable to surgery. He also had arterial studies. He was noncompressible on the left but had normal TBIs and triphasic waveforms. In terms of the  reflux studies I am not really sure whether the left leg was involved in that study or not we will need to review that. His past medical history includes type type 2 diabetes, chronic venous insufficiency with secondary lymphedema, intermittent A-fib, nonischemic cardiomyopathy, thrombus of the left atrial appendage, hereditary hemochromatosis cytosis. He is now on Coumadin presumably for the atrial  fibrillation. ABIs in this clinic were not repeated 9/24; posterior left calf wound. We used Hydrofera Blue under compression. The wound looks better this week better looking surface. 10/1. Posterior left calf wound secondary to venous insufficiency. He was new to the clinic in 2 weeks ago at which time I thought he was going to require a JUANJESUS, DRECHSLER ([id number]) [id number] Page 8 of 10 difficult set of mechanical debridements however the wound surface is continuing to improve using Hydrofera Blue although the dimensions not so remarkably improved today 10/8; posterior left calf wound secondary to chronic venous insufficiency. He continues to think nice progress here on this wound the wound is smaller better looking surface. He arrived in clinic today with an abrasion on his Achilles area on the left leg as well as what looked to be a potential deep tissue injury on the lateral lateral part of the left foot at the level of the base of the fifth metatarsal. 10/15 posterior left calf wound continues to improve. We have been using Prisma under ABDs under Urgo K2 lite's. The areas on the Achilles and the left lateral foot which were concerning last time of maintain skin integrity. He is not wearing stockings even on the right leg I talked to him about this. 10/22; posterior left calf wound measures slightly larger today. The abrasion injury in the Achilles send on the lateral fifth metatarsal base look improved. We have been using Prisma under compression I am going to change this to [facility] today 10/29; posterior left calf wound continues to progress nicely. Presumably an abrasion injury on the Achilles heel. I changed dressing to Hydrofera Blue. Will need to dress the area on the Achilles today. The area on the fifth metatarsal base is not open 11/5; posterior left calf wound continues to improve nicely presumably an abrasion injury on the left Achilles also  looks clean. He has a what looks to be an abrasion on the base of the fifth metatarsal it is not open but it looks as though there is been some friction on this area we will pad this today under the wrap. Patient with chronic venous insufficiency severe hemosiderin deposition and resultant wounds. He has been in the clinic before however that was for a wound on the right posterior calf. Notable he is not wearing stockings even on the uninvolved right leg today 11/11; his original wound on the posterior left calf continues to improve in dimensions. Once again he comes in today with some black eschar around the edge I wonder if this is bleeding. He has the area on the Achilles and now a new area on the left upper lower leg which he says he scratched. And finally he has an area on the right anterior mid tibia which he said he got from taking off his stocking 11/19; he has wounds on his bilateral lower legs. We have been wrapping with Urgo K2's. He has original wound on the left posterior calf, probably a wrap injury on the left lateral calf, area on the left Achilles, and finally the right medial lower leg. We have been using Hydrofera Blue 12/3;  came in this with.a wound on his left posterior leg. since then her has developed a more distal left wound and a right anterior. A wound of the left lateral has healed. We are using HFB with Urgo K2 12/10; the patient initially came in with a wound on his left posterior leg. This is healed today. He subsequently developed an area on the left Achilles heel and an area on the right anterior mid tibia area. We have been using Hydrofera Blue however intake reports that these are getting stuck to the wound. Also he has an area of painful erythema around the posterior left Achilles area. This could be rubbing in the wrap however I think coverage with antibiotics for cellulitis is certainly indicated we will be changing the primary dressing to polymen silver 12/17; his  left posterior leg wound which was his original wound has remained closed. However he subsequently developed an area on the left Achilles heel and the right anterior mid tibia. Last week the area on the Achilles heel had surrounding erythema I gave him a course of doxycycline and this seems to have resolved at least in terms of the erythema. He still however having discomfort in this wound. The area on the right anterior mid tibia looks really quite healthy. We have been polymen silver. 20/24; his original wound on the left posterior lower leg is closed he had a secondary wound just on the superior part of his Achilles tendon this looks better this week no erythema. His last wound is on the right anterior mid tibia. All of this looks quite good. We have been using polymen silver under compression. Objective Constitutional Sitting or standing Blood Pressure is within target range for patient.. Pulse regular and within target range for patient.[name] Respirations regular, non-labored and within target range.. Temperature is normal and within the target range for the patient.[name] Appears in no distress. Vitals Time Taken: 10:34 AM, Height: 73 in, Weight: 254 lbs, BMI: 33.5, Temperature: 97.9 F, Pulse: 73 bpm, Respiratory Rate: 18 breaths/min, Blood Pressure: 108/74 mmHg. General Notes: Wound exam; left posterior calf which was his original wound is eschared over but I do not think there is anything open here. At the superior portion of his Achilles tendon was a secondary wound. This looks quite good. There is no erythema around it. I suspect he had a cellulitis here which is resolved with the antibiotics I gave him. No debridement is required Area on the right anterior mid tibia lower leg really likes quite healthy and smaller. No debridement Integumentary (Hair, Skin) Wound #14 status is Open. Original cause of wound was Blister. The date acquired was: 04/07/2023. The wound has been in treatment 8 weeks. The  wound is located on the Left,Posterior Achilles. The wound measures 1.8cm length x 2.6cm width x 0.1cm depth; 3.676cm^2 area and 0.368cm^3 volume. There is Fat Layer (Subcutaneous Tissue) exposed. There is no tunneling or undermining noted. There is a medium amount of serosanguineous drainage noted. The wound margin is distinct with the outline attached to the wound base. There is large (67-100%) red granulation within the wound bed. There is a small (1-33%) amount of necrotic tissue within the wound bed including Adherent Slough. The periwound skin appearance had no abnormalities noted for moisture. The periwound skin appearance exhibited: Scarring, Hemosiderin Staining. Periwound temperature was noted as No Abnormality. Wound #15 status is Open. Original cause of wound was Trauma. The date acquired was: 02/25/2023. The wound has been in treatment 6 weeks. The wound  is located on the Right,Anterior Lower Leg. The wound measures 1.6cm length x 0.8cm width x 0.1cm depth; 1.005cm^2 area and 0.101cm^3 volume. There is Fat Layer (Subcutaneous Tissue) exposed. There is no tunneling or undermining noted. There is a medium amount of serosanguineous drainage noted. The wound margin is distinct with the outline attached to the wound base. There is large (67-100%) red granulation within the wound bed. There is no necrotic tissue within the wound bed. The periwound skin appearance had no abnormalities noted for texture. The periwound skin appearance had no abnormalities noted for moisture. The periwound skin appearance exhibited: Hemosiderin Staining. The periwound skin appearance did not exhibit: Atrophie Blanche, Cyanosis, Ecchymosis, Mottled, Pallor, Rubor, Erythema. Periwound temperature was noted as No Abnormality. 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 There was a Double Layer Compression Therapy Procedure by Shawn Stall, RN. Post procedure Diagnosis Wound #14: Same as Pre-Procedure Wound #15 Pre-procedure diagnosis of Wound #15 is an Abrasion located on the Right,Anterior Lower Leg . There was a Double Layer Compression Therapy Procedure by Shawn Stall, RN. Post procedure Diagnosis Wound #15: Same as Pre-Procedure Plan Follow-up Appointments: Return Appointment in 2 weeks. - Dr .Mikey Bussing (front office to schedule) Return appointment in 3 weeks. - Dr .Mikey Bussing (front office to schedule) Nurse Visit: - 06/16/23 215pm (already scheduled) Anesthetic: (In clinic) Topical Lidocaine 4% applied to wound bed Bathing/ Shower/ Hygiene: May shower with protection but do not get wound dressing(s) wet. Protect dressing(s) with water repellant cover (for example, large plastic bag) or a cast cover and may then take shower. Edema Control - Orders / Instructions: Elevate legs to the level of the heart or above for 30 minutes daily and/or when sitting for 3-4 times a day throughout the day. Avoid standing for long periods of time. Exercise regularly WOUND #14: - Achilles Wound Laterality: Left, Posterior Cleanser: Soap and Water 1 x Per Week/15 Days Discharge Instructions: May shower and wash wound with dial antibacterial soap and water prior to dressing change. Cleanser: Vashe 5.8 (oz) 1 x Per Week/15 Days Discharge Instructions: Cleanse the wound with Vashe prior to applying a clean dressing using gauze sponges, not tissue or cotton balls. Peri-Wound Care: Sween Lotion  (Moisturizing lotion) 1 x Per Week/15 Days Discharge Instructions: Apply moisturizing lotion as directed Prim Dressing: PolyMem Silver Non-Adhesive Dressing, 4.25x4.25 in 1 x Per Week/15 Days ary Discharge Instructions: Apply to wound bed as instructed Secondary Dressing: ABD Pad, 8x10 1 x Per Week/15 Days Discharge Instructions: Apply over primary dressing as directed. Com pression Wrap: Urgo K2, (equivalent to a 4 layer) two layer compression system, regular 1 x Per Week/15 Days Discharge Instructions: Apply Urgo K2 as directed (alternative to 4 layer compression). WOUND #15: - Lower Leg Wound Laterality: Right, Anterior Cleanser: Soap and Water 1 x Per Week/15 Days Discharge Instructions: May shower and wash wound with dial antibacterial soap and water prior to dressing change. Cleanser: Vashe 5.8 (oz) 1 x Per Week/15 Days Discharge Instructions: Cleanse the wound with Vashe prior to applying a clean dressing using gauze sponges, not tissue or  cotton balls. Peri-Wound Care: Sween Lotion (Moisturizing lotion) 1 x Per Week/15 Days Discharge Instructions: Apply moisturizing lotion as directed Prim Dressing: PolyMem Silver Non-Adhesive Dressing, 4.25x4.25 in 1 x Per Week/15 Days ary Discharge Instructions: Apply to wound bed as instructed Secondary Dressing: ABD Pad, 8x10 1 x Per Week/15 Days Discharge Instructions: Apply over primary dressing as directed. Com pression Wrap: Urgo K2, (equivalent to a 4 layer) two layer compression system, regular 1 x Per Week/15 Days Discharge Instructions: Apply Urgo K2 as directed (alternative to 4 layer compression). 1. We are going to continue with the polymen Ag ABDs and Urgo K2's. He is doing nicely on both sides. 2. Again we talked about compression stockings when these have healed. I think he is going to require 30/40 mm of equivalent compression. Juxta lite stockings would be a good alternative but he seemed more interested in standard over the toe  stockings Electronic Signature(s) Signed: 06/09/2023 11:51:18 AM By: Baltazar Najjar MD Entered By: Baltazar Najjar on 06/09/2023 07:57:25 [name] ([id number]) [id number] Page 10 of 10 -------------------------------------------------------------------------------- SuperBill Details Patient Name: Date of Service: AMARIS, SHARP 06/09/2023 Medical Record Number: [id number] Patient Account Number: [id number] Date of Birth/Sex: Treating RN: March 12, 1956 (67 y.o. Harlon Flor, Yvonne Kendall Primary Care Provider: Hillard Danker Other Clinician: Referring Provider: Treating Provider/Extender: Shara Blazing in Treatment: 14 Diagnosis Coding ICD-10 Codes Code Description [phone] Non-pressure chronic ulcer of other part of left lower leg with other specified severity I87.332 Chronic venous hypertension (idiopathic) with ulcer and inflammation of left lower extremity L97.328 Non-pressure chronic ulcer of left ankle with other specified severity E11.622 Type 2 diabetes mellitus with other skin ulcer L97.818 Non-pressure chronic ulcer of other part of right lower leg with other specified severity Facility Procedures : CPT4: Code [id number] Description: 81 BILATERAL: Application of multi-layer venous compression system; leg (below knee), including ankle and t. Modifier: Quantity: 1 Physician Procedures : CPT4 Code Description Modifier [id number] - WC PHYS LEVEL 3 - EST PT ICD-10 Diagnosis Description L97.828 Non-pressure chronic ulcer of other part of left lower leg with other specified severity L97.818 Non-pressure chronic ulcer of other part of  right lower leg with other specified severity I87.332 Chronic venous hypertension (idiopathic) with ulcer and inflammation of left lower extremity Quantity: 1 Electronic Signature(s) Signed: 06/09/2023 11:51:18 AM By: Baltazar Najjar MD Entered By: Baltazar Najjar on 06/09/2023  07:57:46

## 2023-06-09 NOTE — Progress Notes (Signed)
[name] ([id number]) [id number] Page 1 of 10 Visit Report for 06/09/2023 Arrival Information Details Patient Name: Date of Service: [name], [name] 06/09/2023 10:30 A M Medical Record Number: [id number] Patient Account Number: [id number] Date of Birth/Sex: Treating RN: [birth year] (67 y.o. M) Primary Care Derrion Tritz: Hillard Danker Other Clinician: Referring [name]: Treating [name]/Extender: Shara Blazing in Treatment: 14 Visit Information History Since Last Visit Added or deleted any medications: No Patient Arrived: Ambulatory Any new allergies or adverse reactions: No Arrival Time: 10:34 Had a fall or experienced change in No Accompanied By: self activities of daily living that may affect Transfer Assistance: None risk of falls: Patient Identification Verified: Yes Signs or symptoms of abuse/neglect since last visito No Secondary Verification Process Completed: Yes Hospitalized since last visit: No Patient Requires Transmission-Based Precautions: No Implantable device outside of the clinic excluding No Patient Has Alerts: Yes cellular tissue based products placed in the center Patient Alerts: Patient on Blood Thinner since last visit: Pain Present Now: No Electronic Signature(s) Signed: 06/09/2023 11:17:23 AM By: Dayton Scrape Entered By: Dayton Scrape on 06/09/2023 07:34:37 -------------------------------------------------------------------------------- Compression Therapy Details Patient Name: Date of Service: [name], [name]. 06/09/2023 10:30 A M Medical Record Number: [id number] Patient Account Number: [id number] Date of Birth/Sex: Treating RN: 22-Oct-1955 (67 y.o. [name] Primary Care Lunden Mcleish: Hillard Danker Other Clinician: Referring Oleva Koo: Treating Alessander Sikorski/Extender: Shara Blazing in Treatment: 14 Compression Therapy Performed for Wound Assessment:  Wound #14 Left,Posterior Achilles Performed By: Clinician Shawn Stall, RN Compression Type: Double Layer Post Procedure Diagnosis Same as Pre-procedure Electronic Signature(s) Signed: 06/09/2023 12:25:14 PM By: Shawn Stall RN, BSN Entered By: Shawn Stall on 06/09/2023 07:46:56 Compression Therapy Details -------------------------------------------------------------------------------- [name] ([id number]) [id number] Page 2 of 10 Patient Name: Date of Service: [name], [name] 06/09/2023 10:30 A M Medical Record Number: [id number] Patient Account Number: [id number] Date of Birth/Sex: Treating RN: 25-May-1956 (67 y.o. [name] Primary Care [name]: Hillard Danker Other Clinician: Referring Ted Leonhart: Treating [name]/Extender: Shara Blazing in Treatment: 14 Compression Therapy Performed for Wound Assessment: Wound #15 Right,Anterior Lower Leg Performed By: Clinician Shawn Stall, RN Compression Type: Double Layer Post Procedure Diagnosis Same as Pre-procedure Electronic Signature(s) Signed: 06/09/2023 12:25:14 PM By: Shawn Stall RN, BSN Entered By: Shawn Stall on 06/09/2023 07:46:56 -------------------------------------------------------------------------------- Encounter Discharge Information Details Patient Name: Date of Service: [name] [name] J. 06/09/2023 10:30 A M Medical Record Number: [id number] Patient Account Number: [id number] Date of Birth/Sex: Treating RN: 1956-06-14 (67 y.o. [name] Primary Care Remer Couse: Hillard Danker Other Clinician: Referring [name]: Treating [name]/Extender: Shara Blazing in Treatment: 14 Encounter Discharge Information Items Discharge Condition: Stable Ambulatory Status: Ambulatory Discharge Destination: Home Transportation: Private Auto Accompanied By: self Schedule Follow-up Appointment: Yes Clinical  Summary of Care: Electronic Signature(s) Signed: 06/09/2023 12:25:14 PM By: Shawn Stall RN, BSN Entered By: Shawn Stall on 06/09/2023 07:50:09 -------------------------------------------------------------------------------- Lower Extremity Assessment Details Patient Name: Date of Service: [name] [name] J. 06/09/2023 10:30 A M Medical Record Number: [id number] Patient Account Number: [id number] Date of Birth/Sex: Treating RN: 19-Dec-1955 (67 y.o. [name] Primary Care Margree Gimbel: Hillard Danker Other Clinician: Referring [name]: Treating [name]/Extender: Shara Blazing in Treatment: 14 Edema Assessment Assessed: [name]: Yes] [name]: Yes] Edema: [Left: No] [Right: No] Calf Left: Right: Point of Measurement: 33 cm From Medial Instep 36 cm 37 cm DUC, REISCHMAN ([id number]) [id number] Page 3 of 10 Ankle Left: Right: Point of Measurement:  12 cm From Medial Instep 23 cm 21.5 cm Vascular Assessment Pulses: Dorsalis Pedis Palpable: [Left:Yes] [Right:Yes] Extremity colors, hair growth, and conditions: Extremity Color: [Left:Hyperpigmented] [Right:Hyperpigmented] Hair Growth on Extremity: [Left:No] [Right:No] Temperature of Extremity: [Left:Warm] [Right:Warm] Capillary Refill: [Left:< 3 seconds] [Right:< 3 seconds] Dependent Rubor: [Left:No] [Right:No] Blanched when Elevated: [Left:No Yes] [Right:No No] Toe Nail Assessment Left: Right: Thick: No No Discolored: No No Deformed: No No Improper Length and Hygiene: Yes Yes Electronic Signature(s) Signed: 06/09/2023 12:25:14 PM By: Shawn Stall RN, BSN Entered By: Shawn Stall on 06/09/2023 07:45:35 -------------------------------------------------------------------------------- Multi Wound Chart Details Patient Name: Date of Service: [name] [name] J. 06/09/2023 10:30 A M Medical Record Number: [id number] Patient Account Number: [id number] Date of Birth/Sex:  Treating RN: 12/20/1955 (67 y.o. M) Primary Care [name]: Hillard Danker Other Clinician: Referring [name]: Treating [name]/Extender: Shara Blazing in Treatment: 14 Vital Signs Height(in): 73 Pulse(bpm): 73 Weight(lbs): 254 Blood Pressure(mmHg): 108/74 Body Mass Index(BMI): 33.5 Temperature(F): 97.9 Respiratory Rate(breaths/min): 18 [14:Photos:] [N/A:N/A] Left, Posterior Achilles Right, Anterior Lower Leg N/A Wound Location: Blister Trauma N/A Wounding Event: Diabetic Wound/Ulcer of the Lower Abrasion N/A Primary Etiology: Extremity N/A Diabetic Wound/Ulcer of the Lower N/A Secondary Etiology: Extremity Asthma, Sleep Apnea, Arrhythmia, Asthma, Sleep Apnea, Arrhythmia, N/A Comorbid History: Congestive Heart Failure, Congestive Heart Failure, Hypertension, Type II Diabetes Hypertension, Type II Diabetes 04/07/2023 02/25/2023 N/A Date AcquiredSUHAIB, [name] ([id number]) [phone].pdf Page 4 of 10 8 6  N/A Weeks of Treatment: Open Open N/A Wound Status: No No N/A Wound Recurrence: 1.8x2.6x0.1 1.6x0.8x0.1 N/A Measurements L x W x D (cm) 3.676 1.005 N/A A (cm) : rea 0.368 0.101 N/A Volume (cm) : -1775.50% 14.70% N/A % Reduction in Area: -1740.00% 57.20% N/A % Reduction in Volume: Grade 1 Full Thickness Without Exposed N/A Classification: Support Structures Medium Medium N/A Exudate Amount: Serosanguineous Serosanguineous N/A Exudate Type: red, brown red, brown N/A Exudate Color: Distinct, outline attached Distinct, outline attached N/A Wound Margin: Large (67-100%) Large (67-100%) N/A Granulation Amount: Red Red N/A Granulation Quality: Small (1-33%) None Present (0%) N/A Necrotic Amount: Fat Layer (Subcutaneous Tissue): Yes Fat Layer (Subcutaneous Tissue): Yes N/A Exposed Structures: Fascia: No Fascia: No Tendon: No Tendon: No Muscle: No Muscle: No Joint: No Joint: No Bone: No Bone:  No None Medium (34-66%) N/A Epithelialization: Scarring: Yes Excoriation: No N/A Periwound Skin Texture: Induration: No Callus: No Crepitus: No Rash: No Scarring: No No Abnormalities Noted Maceration: No N/A Periwound Skin Moisture: Dry/Scaly: No Hemosiderin Staining: Yes Hemosiderin Staining: Yes N/A Periwound Skin Color: Atrophie Blanche: No Cyanosis: No Ecchymosis: No Erythema: No Mottled: No Pallor: No Rubor: No No Abnormality No Abnormality N/A Temperature: Compression Therapy Compression Therapy N/A Procedures Performed: Treatment Notes Wound #14 (Achilles) Wound Laterality: Left, Posterior Cleanser Soap and Water Discharge Instruction: May shower and wash wound with dial antibacterial soap and water prior to dressing change. Vashe 5.8 (oz) Discharge Instruction: Cleanse the wound with Vashe prior to applying a clean dressing using gauze sponges, not tissue or cotton balls. Peri-Wound Care Sween Lotion (Moisturizing lotion) Discharge Instruction: Apply moisturizing lotion as directed Topical Primary Dressing PolyMem Silver Non-Adhesive Dressing, 4.25x4.25 in Discharge Instruction: Apply to wound bed as instructed Secondary Dressing ABD Pad, 8x10 Discharge Instruction: Apply over primary dressing as directed. Secured With Compression Wrap Urgo K2, (equivalent to a 4 layer) two layer compression system, regular Discharge Instruction: Apply Urgo K2 as directed (alternative to 4 layer compression). Compression Stockings Add-Ons Wound #15 (Lower Leg) Wound Laterality: Right, Anterior Cleanser Soap and  [address] [place], [name] [name] ([id number]) [id number] Page 5 of 10 Discharge Instruction: May shower and wash wound with dial antibacterial soap and water prior to dressing change. Vashe 5.8 (oz) Discharge Instruction: Cleanse the wound with Vashe prior to applying a clean dressing using gauze sponges, not tissue or cotton balls. Peri-Wound  Care Sween Lotion (Moisturizing lotion) Discharge Instruction: Apply moisturizing lotion as directed Topical Primary Dressing PolyMem Silver Non-Adhesive Dressing, 4.25x4.25 in Discharge Instruction: Apply to wound bed as instructed Secondary Dressing ABD Pad, 8x10 Discharge Instruction: Apply over primary dressing as directed. Secured With Compression Wrap Urgo K2, (equivalent to a 4 layer) two layer compression system, regular Discharge Instruction: Apply Urgo K2 as directed (alternative to 4 layer compression). Compression Stockings Add-Ons Electronic Signature(s) Signed: 06/09/2023 11:51:18 AM By: Baltazar Najjar MD Entered By: Baltazar Najjar on 06/09/2023 07:52:46 -------------------------------------------------------------------------------- Multi-Disciplinary Care Plan Details Patient Name: Date of Service: [name] [name] J. 06/09/2023 10:30 A M Medical Record Number: [id number] Patient Account Number: [id number] Date of Birth/Sex: Treating RN: 04-03-1956 (67 y.o. [name] Primary Care Teia Freitas: Hillard Danker Other Clinician: Referring Quamaine Webb: Treating Rochester Serpe/Extender: Shara Blazing in Treatment: 14 Active Inactive Wound/Skin Impairment Nursing Diagnoses: Impaired tissue integrity Knowledge deficit related to ulceration/compromised skin integrity Goals: Patient/caregiver will verbalize understanding of skin care regimen Date Initiated: 03/03/2023 Target Resolution Date: 07/17/2023 Goal Status: Active Ulcer/skin breakdown will have a volume reduction of 30% by week 4 Date Initiated: 03/03/2023 Date Inactivated: 05/05/2023 Target Resolution Date: 04/18/2023 Unmet Reason: see wound Goal Status: Unmet measurements. Interventions: Assess patient/caregiver ability to obtain necessary supplies Assess patient/caregiver ability to perform ulcer/skin care regimen upon admission and as needed Assess ulceration(s) every  visit Provide education on ulcer and skin care RAHI, DELAGRANGE ([id number]) [id number] Page 6 of 10 Notes: Electronic Signature(s) Signed: 06/09/2023 12:25:14 PM By: Shawn Stall RN, BSN Entered By: Shawn Stall on 06/09/2023 07:48:57 -------------------------------------------------------------------------------- Pain Assessment Details Patient Name: Date of Service: [name] [name] J. 06/09/2023 10:30 A M Medical Record Number: [id number] Patient Account Number: [id number] Date of Birth/Sex: Treating RN: October 02, 1955 (67 y.o. M) Primary Care Aimar Borghi: Hillard Danker Other Clinician: Referring Kiril Hippe: Treating Absalom Aro/Extender: Shara Blazing in Treatment: 14 Active Problems Location of Pain Severity and Description of Pain Patient Has Paino No Site Locations Pain Management and Medication Current Pain Management: Electronic Signature(s) Signed: 06/09/2023 11:17:23 AM By: Dayton Scrape Entered By: Dayton Scrape on 06/09/2023 07:35:13 -------------------------------------------------------------------------------- Patient/Caregiver Education Details Patient Name: Date of Service: [name] 12/24/2024andnbsp10:30 A M Medical Record Number: [id number] Patient Account Number: [id number] Date of Birth/Gender: Treating RN: 08-Jun-1956 (67 y.o. [name] Primary Care Physician: Hillard Danker Other Clinician: Referring Physician: Treating Physician/Extender: Shara Blazing in Treatment: [address] [place], [place] J ([id number]) [id number] Page 7 of 10 Education Provided To: Patient Education Topics Provided Wound/Skin Impairment: Handouts: Caring for Your Ulcer Methods: Explain/Verbal Responses: Reinforcements needed Electronic Signature(s) Signed: 06/09/2023 12:25:14 PM By: Shawn Stall RN, BSN Entered By: Shawn Stall on 06/09/2023  07:49:26 -------------------------------------------------------------------------------- Wound Assessment Details Patient Name: Date of Service: [name], [name] [name] J. 06/09/2023 10:30 A M Medical Record Number: [id number] Patient Account Number: [id number] Date of Birth/Sex: Treating RN: Jun 17, 1955 (67 y.o. M) Primary Care Donell Tomkins: Hillard Danker Other Clinician: Referring Fraidy Mccarrick: Treating Emmily Pellegrin/Extender: Shara Blazing in Treatment: 14 Wound Status Wound Number: 14 Primary Diabetic Wound/Ulcer of the Lower Extremity Etiology: Wound Location: Left, Posterior Achilles Wound Open Wounding Event: Blister Status: Date Acquired: 04/07/2023  Comorbid Asthma, Sleep Apnea, Arrhythmia, Congestive Heart Failure, Weeks Of Treatment: 8 History: Hypertension, Type II Diabetes Clustered Wound: No Photos Wound Measurements Length: (cm) 1.8 Width: (cm) 2.6 Depth: (cm) 0.1 Area: (cm) 3.676 Volume: (cm) 0.368 % Reduction in Area: -1775.5% % Reduction in Volume: -1740% Epithelialization: None Tunneling: No Undermining: No Wound Description Classification: Grade 1 Wound Margin: Distinct, outline attached Exudate Amount: Medium Exudate Type: Serosanguineous Exudate Color: red, brown Foul Odor After Cleansing: No Slough/Fibrino Yes Wound Bed Granulation Amount: Large (67-100%) Exposed Structure Granulation Quality: Red Fascia Exposed: No Necrotic Amount: Small (1-33%) Fat Layer (Subcutaneous Tissue) Exposed: Yes Necrotic Quality: Adherent Slough Tendon Exposed: No Muscle Exposed: No THEORDORE, VOLBRECHT ([id number]) [id number] Page 8 of 10 Joint Exposed: No Bone Exposed: No Periwound Skin Texture Texture Color No Abnormalities Noted: No No Abnormalities Noted: No Scarring: Yes Hemosiderin Staining: Yes Moisture Temperature / Pain No Abnormalities Noted: Yes Temperature: No Abnormality Treatment Notes Wound #14  (Achilles) Wound Laterality: Left, Posterior Cleanser Soap and Water Discharge Instruction: May shower and wash wound with dial antibacterial soap and water prior to dressing change. Vashe 5.8 (oz) Discharge Instruction: Cleanse the wound with Vashe prior to applying a clean dressing using gauze sponges, not tissue or cotton balls. Peri-Wound Care Sween Lotion (Moisturizing lotion) Discharge Instruction: Apply moisturizing lotion as directed Topical Primary Dressing PolyMem Silver Non-Adhesive Dressing, 4.25x4.25 in Discharge Instruction: Apply to wound bed as instructed Secondary Dressing ABD Pad, 8x10 Discharge Instruction: Apply over primary dressing as directed. Secured With Compression Wrap Urgo K2, (equivalent to a 4 layer) two layer compression system, regular Discharge Instruction: Apply Urgo K2 as directed (alternative to 4 layer compression). Compression Stockings Add-Ons Electronic Signature(s) Signed: 06/09/2023 12:25:14 PM By: Shawn Stall RN, BSN Entered By: Shawn Stall on 06/09/2023 07:46:02 -------------------------------------------------------------------------------- Wound Assessment Details Patient Name: Date of Service: [name], [name] [name] J. 06/09/2023 10:30 A M Medical Record Number: [id number] Patient Account Number: [id number] Date of Birth/Sex: Treating RN: 09/24/1955 (67 y.o. M) Primary Care Shareena Nusz: Hillard Danker Other Clinician: Referring Yancey Pedley: Treating Taquita Demby/Extender: Shara Blazing in Treatment: 14 Wound Status Wound Number: 15 Primary Abrasion Etiology: Wound Location: Right, Anterior Lower Leg Secondary Diabetic Wound/Ulcer of the Lower Extremity Wounding Event: Trauma Etiology: Date Acquired: 02/25/2023 Wound Open Weeks Of Treatment: 6 Status: Clustered Wound: No Comorbid Asthma, Sleep Apnea, Arrhythmia, Congestive Heart Failure, History: Hypertension, Type II Diabetes BRONNER, MACGILL  ([id number]) [id number] Page 9 of 10 Photos Wound Measurements Length: (cm) 1.6 Width: (cm) 0.8 Depth: (cm) 0.1 Area: (cm) 1.005 Volume: (cm) 0.101 % Reduction in Area: 14.7% % Reduction in Volume: 57.2% Epithelialization: Medium (34-66%) Tunneling: No Undermining: No Wound Description Classification: Full Thickness Without Exposed Support Structures Wound Margin: Distinct, outline attached Exudate Amount: Medium Exudate Type: Serosanguineous Exudate Color: red, brown Foul Odor After Cleansing: No Slough/Fibrino No Wound Bed Granulation Amount: Large (67-100%) Exposed Structure Granulation Quality: Red Fascia Exposed: No Necrotic Amount: None Present (0%) Fat Layer (Subcutaneous Tissue) Exposed: Yes Tendon Exposed: No Muscle Exposed: No Joint Exposed: No Bone Exposed: No Periwound Skin Texture Texture Color No Abnormalities Noted: Yes No Abnormalities Noted: No Atrophie Blanche: No Moisture Cyanosis: No No Abnormalities Noted: Yes Ecchymosis: No Erythema: No Hemosiderin Staining: Yes Mottled: No Pallor: No Rubor: No Temperature / Pain Temperature: No Abnormality Treatment Notes Wound #15 (Lower Leg) Wound Laterality: Right, Anterior Cleanser Soap and Water Discharge Instruction: May shower and wash wound with dial antibacterial soap and water prior to dressing change. Vashe 5.8 (oz) Discharge  Instruction: Cleanse the wound with Vashe prior to applying a clean dressing using gauze sponges, not tissue or cotton balls. Peri-Wound Care Sween Lotion (Moisturizing lotion) Discharge Instruction: Apply moisturizing lotion as directed Topical Primary Dressing PolyMem Silver Non-Adhesive Dressing, 4.25x4.25 in Discharge Instruction: Apply to wound bed as instructed Secondary Dressing ABD Pad, 8x10 [name], [name] ([id number]) [phone] Page 10 of 10 Discharge Instruction: Apply over primary dressing as  directed. Secured With Compression Wrap Urgo K2, (equivalent to a 4 layer) two layer compression system, regular Discharge Instruction: Apply Urgo K2 as directed (alternative to 4 layer compression). Compression Stockings Add-Ons Electronic Signature(s) Signed: 06/09/2023 12:25:14 PM By: Shawn Stall RN, BSN Entered By: Shawn Stall on 06/09/2023 07:46:39 -------------------------------------------------------------------------------- Vitals Details Patient Name: Date of Service: [name], [name] [name] J. 06/09/2023 10:30 A M Medical Record Number: [id number] Patient Account Number: [id number] Date of Birth/Sex: Treating RN: 10-21-1955 (67 y.o. M) Primary Care Journiee Feldkamp: Hillard Danker Other Clinician: Referring Kyndra Condron: Treating Danis Pembleton/Extender: Shara Blazing in Treatment: 14 Vital Signs Time Taken: 10:34 Temperature (F): 97.9 Height (in): 73 Pulse (bpm): 73 Weight (lbs): 254 Respiratory Rate (breaths/min): 18 Body Mass Index (BMI): 33.5 Blood Pressure (mmHg): 108/74 Reference Range: 80 - 120 mg / dl Electronic Signature(s) Signed: 06/09/2023 11:17:23 AM By: Dayton Scrape Entered By: Dayton Scrape on 06/09/2023 07:35:06

## 2023-06-15 NOTE — Progress Notes (Signed)
Remote ICD transmission.   

## 2023-06-16 ENCOUNTER — Encounter (HOSPITAL_BASED_OUTPATIENT_CLINIC_OR_DEPARTMENT_OTHER): Payer: Medicare HMO | Admitting: General Surgery

## 2023-06-16 DIAGNOSIS — I480 Paroxysmal atrial fibrillation: Secondary | ICD-10-CM | POA: Diagnosis not present

## 2023-06-16 DIAGNOSIS — L97818 Non-pressure chronic ulcer of other part of right lower leg with other specified severity: Secondary | ICD-10-CM | POA: Diagnosis not present

## 2023-06-16 DIAGNOSIS — E11622 Type 2 diabetes mellitus with other skin ulcer: Secondary | ICD-10-CM | POA: Diagnosis not present

## 2023-06-16 DIAGNOSIS — I428 Other cardiomyopathies: Secondary | ICD-10-CM | POA: Diagnosis not present

## 2023-06-16 DIAGNOSIS — I110 Hypertensive heart disease with heart failure: Secondary | ICD-10-CM | POA: Diagnosis not present

## 2023-06-16 DIAGNOSIS — I87332 Chronic venous hypertension (idiopathic) with ulcer and inflammation of left lower extremity: Secondary | ICD-10-CM | POA: Diagnosis not present

## 2023-06-16 DIAGNOSIS — I5022 Chronic systolic (congestive) heart failure: Secondary | ICD-10-CM | POA: Diagnosis not present

## 2023-06-16 DIAGNOSIS — L97328 Non-pressure chronic ulcer of left ankle with other specified severity: Secondary | ICD-10-CM | POA: Diagnosis not present

## 2023-06-16 DIAGNOSIS — L97828 Non-pressure chronic ulcer of other part of left lower leg with other specified severity: Secondary | ICD-10-CM | POA: Diagnosis not present

## 2023-06-17 NOTE — Progress Notes (Signed)
 LINKON, SIVERSON ([id number]) [id number] Page 1 of 1 Visit Report for 06/16/2023 SuperBill Details Patient Name: Date of Service: [name], [name] 06/16/2023 Medical Record Number: [id number] Patient Account Number: [id number] Date of Birth/Sex: Treating RN: 06-29-1955 (68 y.o. M) Primary Care Provider: Rollene Norris Other Clinician: Wyn Iha Referring Provider: Treating Provider/Extender: Marolyn Delon Rollene Norris [name] in Treatment: 15 Diagnosis Coding ICD-10 Codes Code Description [phone] Non-pressure chronic ulcer of other part of left lower leg with other specified severity I87.332 Chronic venous hypertension (idiopathic) with ulcer and inflammation of left lower extremity L97.328 Non-pressure chronic ulcer of left ankle with other specified severity E11.622 Type 2 diabetes mellitus with other skin ulcer L97.818 Non-pressure chronic ulcer of other part of right lower leg with other specified severity Facility Procedures CPT4 Description Modifier Quantity Code [id number] BILATERAL: Application of multi-layer venous compression system; leg (below knee), including ankle and 1 foot. Electronic Signature(s) Signed: 06/16/2023 3:02:08 PM By: Marolyn Delon MD FACS Signed: 06/16/2023 4:04:02 PM By: Wyn Iha Entered By: Wyn Iha on 06/16/2023 14:05:13

## 2023-06-17 NOTE — Progress Notes (Signed)
 FABIANO, GINLEY ([id number]) [id number] Page 1 of 5 Visit Report for 06/16/2023 Arrival Information Details Patient Name: Date of Service: CLEDITH, ABDOU 06/16/2023 2:15 PM Medical Record Number: [id number] Patient Account Number: [id number] Date of Birth/Sex: Treating RN: 10/10/1955 (68 y.o. M) Primary Care Friedrich Harriott: Rollene Norris Other Clinician: Referring Sherice Ijames: Treating Nioma Mccubbins/Extender: Marolyn Delon Rollene Norris Devra in Treatment: 15 Visit Information History Since Last Visit Added or deleted any medications: No Patient Arrived: Ambulatory Any new allergies or adverse reactions: No Arrival Time: 13:30 Had a fall or experienced change in No Accompanied By: self activities of daily living that may affect Transfer Assistance: None risk of falls: Patient Identification Verified: Yes Signs or symptoms of abuse/neglect since last visito No Secondary Verification Process Completed: Yes Hospitalized since last visit: No Patient Requires Transmission-Based Precautions: No Implantable device outside of the clinic excluding No Patient Has Alerts: Yes cellular tissue based products placed in the center Patient Alerts: Patient on Blood Thinner since last visit: Has Dressing in Place as Prescribed: Yes Has Compression in Place as Prescribed: Yes Pain Present Now: No Electronic Signature(s) Signed: 06/16/2023 4:04:02 PM By: Wyn Iha Entered By: Wyn Iha on 06/16/2023 13:32:05 -------------------------------------------------------------------------------- Compression Therapy Details Patient Name: Date of Service: [name] FLORINA LESLEE PARAS. 06/16/2023 2:15 PM Medical Record Number: [id number] Patient Account Number: [id number] Date of Birth/Sex: Treating RN: 31-Aug-1955 (68 y.o. M) Primary Care Charrisse Masley: Rollene Norris Other Clinician: Referring Riccardo Holeman: Treating Aariyah Sampey/Extender: Marolyn Delon Rollene Norris Devra in Treatment: 15 Compression Therapy Performed for Wound Assessment: Wound #14 Left,Posterior Achilles Performed By: Clinician Wyn Iha, Compression Type: Double Layer Electronic Signature(s) Signed: 06/16/2023 4:04:02 PM By: Wyn Iha Entered By: Wyn Iha on 06/16/2023 14:02:57 -------------------------------------------------------------------------------- Compression Therapy Details Patient Name: Date of Service: [name] FLORINA LESLEE PARAS. 06/16/2023 2:15 PM Medical Record Number: [id number] Patient Account Number: [id number] KRITHIK, MAPEL ([id number]) [id number] Page 2 of 5 Date of Birth/Sex: Treating RN: 01-Jun-1956 (68 y.o. M) Primary Care Melvia Matousek: Other Clinician: Rollene Norris Referring Arius Harnois: Treating Saphia Vanderford/Extender: Marolyn Delon Rollene Norris Devra in Treatment: 15 Compression Therapy Performed for Wound Assessment: Wound #15 Right,Anterior Lower Leg Performed By: Clinician Wyn Iha, Compression Type: Double Layer Electronic Signature(s) Signed: 06/16/2023 4:04:02 PM By: Wyn Iha Entered By: Wyn Iha on 06/16/2023 14:02:58 -------------------------------------------------------------------------------- Encounter Discharge Information Details Patient Name: Date of Service: [name] FLORINA LESLEE PARAS. 06/16/2023 2:15 PM Medical Record Number: [id number] Patient Account Number: [id number] Date of Birth/Sex: Treating RN: 1955-11-28 (68 y.o. M) Primary Care Neetu Carrozza: Rollene Norris Other Clinician: Wyn Iha Referring Clothilde Tippetts: Treating Jamarrion Budai/Extender: Marolyn Delon Rollene Norris Devra in Treatment: 15 Encounter Discharge Information Items Discharge Condition: Stable Ambulatory Status: Ambulatory Discharge Destination: Home Transportation: Private Auto Accompanied By: self Schedule Follow-up Appointment: Yes Clinical Summary of Care: Electronic  Signature(s) Signed: 06/16/2023 4:04:02 PM By: Wyn Iha Entered By: Wyn Iha on 06/16/2023 14:04:59 -------------------------------------------------------------------------------- Patient/Caregiver Education Details Patient Name: Date of Service: [name] FLORINA LESLEE DOROTHA 12/31/2024andnbsp2:15 PM Medical Record Number: [id number] Patient Account Number: [id number] Date of Birth/Gender: Treating RN: [birth year] (68 y.o. M) Primary Care Physician: Rollene Norris Other Clinician: Wyn Iha Referring Physician: Treating Physician/Extender: Marolyn Delon Rollene Norris Devra in Treatment: 15 Education Assessment Education Provided To: Patient Education Topics Provided Electronic Signature(s) Signed: 06/16/2023 4:04:02 PM By: Wyn Iha Entered By: Wyn Iha on 06/16/2023 14:03:16 [name] DARINA PARAS ([id number]) [id number] Page 3 of 5 -------------------------------------------------------------------------------- Wound Assessment Details Patient Name: Date of Service: KULE, GASCOIGNE 06/16/2023 2:15 PM Medical Record Number: [id number] Patient Account Number: [id number] Date  of Birth/Sex: Treating RN: Jan 19, 1956 (68 y.o. M) Primary Care Crestina Strike: Rollene Norris Other Clinician: Referring Sumaya Riedesel: Treating Cindi Ghazarian/Extender: Marolyn Delon Rollene Norris Devra in Treatment: 15 Wound Status Wound Number: 14 Primary Diabetic Wound/Ulcer of the Lower Extremity Etiology: Wound Location: Left, Posterior Achilles Wound Open Wounding Event: Blister Status: Date Acquired: 04/07/2023 Comorbid Asthma, Sleep Apnea, Arrhythmia, Congestive Heart Failure, Weeks Of Treatment: 9 History: Hypertension, Type II Diabetes Clustered Wound: No Wound Measurements Length: (cm) 1.8 Width: (cm) 2.6 Depth: (cm) 0.1 Area: (cm) 3.676 Volume: (cm) 0.368 % Reduction in Area: -1775.5% % Reduction in Volume:  -1740% Epithelialization: None Wound Description Classification: Grade 1 Wound Margin: Distinct, outline attached Exudate Amount: Medium Exudate Type: Serosanguineous Exudate Color: red, brown Foul Odor After Cleansing: No Slough/Fibrino Yes Wound Bed Granulation Amount: Large (67-100%) Exposed Structure Granulation Quality: Red Fascia Exposed: No Necrotic Amount: Small (1-33%) Fat Layer (Subcutaneous Tissue) Exposed: Yes Necrotic Quality: Adherent Slough Tendon Exposed: No Muscle Exposed: No Joint Exposed: No Bone Exposed: No Periwound Skin Texture Texture Color No Abnormalities Noted: No No Abnormalities Noted: No Scarring: Yes Hemosiderin Staining: Yes Moisture Temperature / Pain No Abnormalities Noted: Yes Temperature: No Abnormality Treatment Notes Wound #14 (Achilles) Wound Laterality: Left, Posterior Cleanser Soap and Water Discharge Instruction: May shower and wash wound with dial antibacterial soap and water prior to dressing change. Vashe 5.8 (oz) Discharge Instruction: Cleanse the wound with Vashe prior to applying a clean dressing using gauze sponges, not tissue or cotton balls. Peri-Wound Care Sween Lotion (Moisturizing lotion) Discharge Instruction: Apply moisturizing lotion as directed Topical Primary Dressing PolyMem Silver  Non-Adhesive Dressing, 4.25x4.25 in Discharge Instruction: Apply to wound bed as instructed ERMAN, THUM ([id number]) [phone].pdf Page 4 of 5 Secondary Dressing ABD Pad, 8x10 Discharge Instruction: Apply over primary dressing as directed. Secured With Compression Wrap Urgo K2, (equivalent to a 4 layer) two layer compression system, regular Discharge Instruction: Apply Urgo K2 as directed (alternative to 4 layer compression). Compression Stockings Add-Ons Electronic Signature(s) Signed: 06/16/2023 4:04:02 PM By: Wyn Iha Entered By: Wyn Iha on 06/16/2023  13:32:37 -------------------------------------------------------------------------------- Wound Assessment Details Patient Name: Date of Service: PORFIRIO, BOLLIER 06/16/2023 2:15 PM Medical Record Number: [id number] Patient Account Number: [id number] Date of Birth/Sex: Treating RN: August 05, 1955 (68 y.o. M) Primary Care Rahsaan Weakland: Rollene Norris Other Clinician: Referring Tassie Pollett: Treating Dashia [name]/Extender: Marolyn Delon Rollene Norris Devra in Treatment: 15 Wound Status Wound Number: 15 Primary Abrasion Etiology: Wound Location: Right, Anterior Lower Leg Secondary Diabetic Wound/Ulcer of the Lower Extremity Wounding Event: Trauma Etiology: Date Acquired: 02/25/2023 Wound Open Weeks Of Treatment: 7 Status: Clustered Wound: No Comorbid Asthma, Sleep Apnea, Arrhythmia, Congestive Heart Failure, History: Hypertension, Type II Diabetes Wound Measurements Length: (cm) 1.6 Width: (cm) 0.8 Depth: (cm) 0.1 Area: (cm) 1.005 Volume: (cm) 0.101 % Reduction in Area: 14.7% % Reduction in Volume: 57.2% Epithelialization: Medium (34-66%) Wound Description Classification: Full Thickness Without Exposed Support Structures Wound Margin: Distinct, outline attached Exudate Amount: Medium Exudate Type: Serosanguineous Exudate Color: red, brown Foul Odor After Cleansing: No Slough/Fibrino No Wound Bed Granulation Amount: Large (67-100%) Exposed Structure Granulation Quality: Red Fascia Exposed: No Necrotic Amount: None Present (0%) Fat Layer (Subcutaneous Tissue) Exposed: Yes Tendon Exposed: No Muscle Exposed: No Joint Exposed: No Bone Exposed: No Periwound Skin Texture Texture Color No Abnormalities Noted: Yes No Abnormalities Noted: No Atrophie Blanche: No Moisture Cyanosis: No No Abnormalities Noted: Yes Ecchymosis: No Erythema: No ALLISTER, LESSLEY ([id number]) [id number] Page 5 of 5 Hemosiderin Staining: Yes Mottled: No Pallor:  No Rubor:  No Temperature / Pain Temperature: No Abnormality Treatment Notes Wound #15 (Lower Leg) Wound Laterality: Right, Anterior Cleanser Soap and Water Discharge Instruction: May shower and wash wound with dial antibacterial soap and water prior to dressing change. Vashe 5.8 (oz) Discharge Instruction: Cleanse the wound with Vashe prior to applying a clean dressing using gauze sponges, not tissue or cotton balls. Peri-Wound Care Sween Lotion (Moisturizing lotion) Discharge Instruction: Apply moisturizing lotion as directed Topical Primary Dressing PolyMem Silver  Non-Adhesive Dressing, 4.25x4.25 in Discharge Instruction: Apply to wound bed as instructed Secondary Dressing ABD Pad, 8x10 Discharge Instruction: Apply over primary dressing as directed. Secured With Compression Wrap Urgo K2, (equivalent to a 4 layer) two layer compression system, regular Discharge Instruction: Apply Urgo K2 as directed (alternative to 4 layer compression). Compression Stockings Add-Ons Electronic Signature(s) Signed: 06/16/2023 4:04:02 PM By: Wyn Iha Entered By: Wyn Iha on 06/16/2023 13:32:48 -------------------------------------------------------------------------------- Vitals Details Patient Name: Date of Service: [name] NDY J. 06/16/2023 2:15 PM Medical Record Number: [id number] Patient Account Number: [id number] Date of Birth/Sex: Treating RN: 09-22-1955 (68 y.o. M) Primary Care Kursten Kruk: Rollene Norris Other Clinician: Referring Dinita Migliaccio: Treating Cleta Heatley/Extender: Marolyn Delon Rollene Norris Devra in Treatment: 15 Vital Signs Time Taken: 13:32 Reference Range: 80 - 120 mg / dl Height (in): 73 Weight (lbs): 254 Body Mass Index (BMI): 33.5 Electronic Signature(s) Signed: 06/16/2023 4:04:02 PM By: Wyn Iha Entered By: Wyn Iha on 06/16/2023 13:32:14

## 2023-06-18 ENCOUNTER — Other Ambulatory Visit (HOSPITAL_COMMUNITY): Payer: Self-pay

## 2023-06-18 ENCOUNTER — Telehealth: Payer: Self-pay | Admitting: Pharmacy Technician

## 2023-06-18 ENCOUNTER — Other Ambulatory Visit: Payer: Self-pay | Admitting: Internal Medicine

## 2023-06-18 NOTE — Telephone Encounter (Signed)
 PA request has been Started. New Encounter created for follow up. For additional info see Pharmacy Prior Auth telephone encounter from 06/18/23.

## 2023-06-18 NOTE — Telephone Encounter (Signed)
 Pharmacy Patient Advocate Encounter   Received notification from Pt Calls Messages that prior authorization for Ozempic  2mg  is required/requested.   Insurance verification completed.   The patient is insured through [employer] .   Per test claim: Too soon to fill, currently. Last fill 05/30/23, next fill 07/16/23. However, per pt's notification of needing a new pa, started one. PA required and submitted KEY/EOC/Request #: [id number] Returned unable to retrieve clinical questions. Authorization already on file for this request. (Per CMM)

## 2023-06-23 ENCOUNTER — Encounter (HOSPITAL_BASED_OUTPATIENT_CLINIC_OR_DEPARTMENT_OTHER): Payer: Medicare HMO | Attending: Internal Medicine | Admitting: Internal Medicine

## 2023-06-23 DIAGNOSIS — I87332 Chronic venous hypertension (idiopathic) with ulcer and inflammation of left lower extremity: Secondary | ICD-10-CM | POA: Diagnosis not present

## 2023-06-23 DIAGNOSIS — I428 Other cardiomyopathies: Secondary | ICD-10-CM | POA: Diagnosis not present

## 2023-06-23 DIAGNOSIS — L97828 Non-pressure chronic ulcer of other part of left lower leg with other specified severity: Secondary | ICD-10-CM | POA: Diagnosis not present

## 2023-06-23 DIAGNOSIS — L97818 Non-pressure chronic ulcer of other part of right lower leg with other specified severity: Secondary | ICD-10-CM | POA: Diagnosis not present

## 2023-06-23 DIAGNOSIS — E11622 Type 2 diabetes mellitus with other skin ulcer: Secondary | ICD-10-CM | POA: Diagnosis not present

## 2023-06-23 DIAGNOSIS — L97328 Non-pressure chronic ulcer of left ankle with other specified severity: Secondary | ICD-10-CM | POA: Insufficient documentation

## 2023-06-23 DIAGNOSIS — I5022 Chronic systolic (congestive) heart failure: Secondary | ICD-10-CM | POA: Diagnosis not present

## 2023-06-23 DIAGNOSIS — I110 Hypertensive heart disease with heart failure: Secondary | ICD-10-CM | POA: Insufficient documentation

## 2023-06-23 DIAGNOSIS — Z7984 Long term (current) use of oral hypoglycemic drugs: Secondary | ICD-10-CM | POA: Insufficient documentation

## 2023-06-23 DIAGNOSIS — L97821 Non-pressure chronic ulcer of other part of left lower leg limited to breakdown of skin: Secondary | ICD-10-CM | POA: Insufficient documentation

## 2023-06-23 DIAGNOSIS — I480 Paroxysmal atrial fibrillation: Secondary | ICD-10-CM | POA: Insufficient documentation

## 2023-06-23 DIAGNOSIS — Z7901 Long term (current) use of anticoagulants: Secondary | ICD-10-CM | POA: Diagnosis not present

## 2023-06-24 ENCOUNTER — Telehealth (HOSPITAL_COMMUNITY): Payer: Self-pay | Admitting: Pharmacy Technician

## 2023-06-24 NOTE — Telephone Encounter (Signed)
 Advanced Heart Failure Patient Advocate Encounter  The patient was approved for a [employer] grant that will help cover the cost of Jardiance , Entresto , Coreg  and Spironolactone . Total amount awarded, $10,000. Eligibility, 05/25/23 - 05/23/24.  ID [id number]  BIN [id number]  PCN [id number]  Group [id number]  Emailed with information sent. Provided information on how to receive reimbursement as well.  Almarie JULIANNA Pa, CPhT

## 2023-06-25 NOTE — Progress Notes (Signed)
 [name], [name] ([id number]) [id number] Page 1 of 10 Visit Report for 06/23/2023 Chief Complaint Document Details Patient Name: Date of Service: [name], [name] 06/23/2023 1:00 PM Medical Record Number: [id number] Patient Account Number: [id number] Date of Birth/Sex: Treating RN: [birth year] (68 y.o. M) Primary Care Provider: Rollene Norris Other Clinician: Referring Provider: Treating Provider/Extender: Rosan Harlene Rollene Norris Devra in Treatment: 16 Information Obtained from: Patient Chief Complaint 07/29/2018; patient is here for review of a fairly sizable wound on his right posterior calf 03/14/16/24; patient is here for review of a sizable wound on the left posterior calf Electronic Signature(s) Signed: 06/24/2023 5:26:35 PM By: Rosan Harlene DO Entered By: Rosan Harlene on 06/23/2023 13:38:01 -------------------------------------------------------------------------------- HPI Details Patient Name: Date of Service: [name], [name] 06/23/2023 1:00 PM Medical Record Number: [id number] Patient Account Number: [id number] Date of Birth/Sex: Treating RN: [birth year] (68 y.o. M) Primary Care Provider: Rollene Norris Other Clinician: Referring Provider: Treating Provider/Extender: Rosan Harlene Rollene Norris Devra in Treatment: 16 History of Present Illness HPI Description: ADMISSION 07/29/2018 Mr. Desa is a 68 year old man with either prediabetes or diabetes he is on glipizide . In late October to November 2019 he noted a scabbed area on the back of his right calf. He picked this off a few times but it would not heal. He saw his primary physician on 12/5. It was felt at that time that this may actually heal on its own with conservative management. The next visit was on 07/20/2018 noted the area was a lot worse and arranged for his treatment here. He has been topical antibiotics like Neosporin although he stopped using this  when the wound looked worse. He is just been covering this with a clean Band-Aid. He is given Bactrim  a week ago and he is finishing this currently. It is made some improvement in the surrounding erythema per the patient. He does not have a history of nonhealing wounds. No prior history of wounds on his legs that he had difficulty healing. No prior skin issues. 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 Past medical history; A. fib status post ablation and recent cardioversion he is on Xarelto . He has a history of systolic heart failure, cervical radiculopathy, cardiomyopathy and type 2 diabetes as discussed ABI in the right leg was noncompressible on the right 2/20; the biopsy I did on the patient last week was negative for malignancy. Culture grew Pseudomonas and methicillin sensitive staph aureus. He is on cefdinir 300 twice a day. I will have to make this a 10 day course. He put him in compression. He states that the redness pain and erythema are a lot better. I suspect the patient has chronic venous insufficiency probably with a secondary cellulitis 2/27; arrives today with copious amounts of drainage coming out of the wound irritating the skin below the wound area. He only renewed the final 3 days of cefdinir today 3/5; came in for a nurse change 3 days ago. Again a lot of drainage noted. Zinc oxide was applied unfortunately the drainage appears to a pool then he has a string of superficial open areas extending down into the Achilles area and some just below the wound. The wound itself does not look too bad. He is completed the antibiotics although apparently there was separation from the original 7 with the last 3 days. Culture grew a few MSSA and a few Pseudomonas 3/12; not too much difference over the last week. He came in for a dressing change on Monday  by our nursing staff. Necrotic debris again over the wound surface. The entire area looks irritated but nontender and I do not  think shows obvious evidence of infection. We have not heard anything about the reflux studies. Also notable than in this diabetic man we had noncompressible vessels and although I can feel his pulses easily in his feet I will order arterial studies as well. 3/19-Patient had experience more pain has been dressed with a silver  alginate, the wounds all have necrotic debris, very friable with easy bleeding with any kind of surface debridement including with gauze and Anasept and with a #3 curette. His vascular appointments unfortunately were all canceled on account of KAMAREE, [name] ([id number]) [id number].pdf Page 2 of 10 the virus outbreak resulting in studies only being done for emergent cases. His pulses are easily palpable in the lower extremity. He has open areas below the primary wound where he states the drainage which included purulent material also with some blood pooled and caused breakdown. 3/26; the patient was changed to Santyl with Hydrofera Blue backing last week. This was largely because the silver  alginate was sticking to the wound. He is still having quite a bit of pain. He tells us  that the reflux studies and arterial studies we had attempted to arrange through vein and vascular are not being booked until mid May. Culture that was done last week showed both Serratia moderate and a few methicillin sensitive staph aureus I started him on Bactrim  DS 1 p.o. twice daily on 3/23 for 7 days. He is here for follow-up. 4/3; patient is on Santyl with Hydrofera Blue. Patient complains of pain. Our intake nurse is noted drainage and some odor. He has completed Bactrim  recently for Serratia and a few methicillin sensitive staph aureus. After our staff push hard last week we were able to get venous studies as well as arterial studies. His arterial studies showed on the right great toe pressure of 0.86. On the left ABI at 1.47 TBI of 0.87. On both sides waveforms were  triphasic VENOUS STUDIES showed reflux in the femoral vein, popliteal vein great saphenous vein at the saphenofemoral junction and great saphenous vein at the proximal thigh. Also noted to have age-indeterminate superficial vein thrombosis involving the small saphenous vein. Notable that the patient is already on Xarelto  for atrial fibrillation. 4/9; the patient has been to see vascular surgery and reviewed by Dr. Melvenia. He was felt to have only minimal superficial venous reflux in the right great greater saphenous vein. For this reason he was not felt to be a candidate for laser ablation of the right greater saphenous vein. A wedge cushion was suggested to keep his leg elevated. He does have deep venous reflux on the right. Culture I did last time showed a combination of Serratia Pseudomonas and methicillin sensitive staph aureus. We have been using Hydrofera Blue to the large wound, Santyl to some of the deep punched out satellite lesions distal to it. There is some improvement 4/16; the application for Apligraf was put out for further review for material that we have resubmitted. He has the deep area on the right posterior calf which is large and then 3 small punched out areas beneath this. In general his wounds look a lot better 4/23; the patient has his large original wound and then 2 punched out satellite lesions below it on the right posterior calf. I was usually able to use Apligraf #1 to cover the full surface area of the larger wound. We  continued with Santyl and Hydrofera Blue on the 2 punched-out areas 5/7; patient has his large original wound and 2 punched out satellite lesions below it in the right posterior calf. Apligraf #2 today. Major improvement in the big wound in terms of wound depth. Punched-out Warren wounds have a very healthy looking wound bed. 5/21-Patient comes back for his large right posterior calf wound for which she has been an Apligraf #3 today. Wound depth seems to be  better, the punched-out wounds appear to have healthy bed with some bleeding 6/4; right posterior calf wound is much better. Apligraf #4 today. The major wound has no wound depth at all. Only 1 of the satellite lesions is still open. The patient has very high blood pressure coming in today with a diastolic blood pressure of 130 after lying there for a few minutes it was down to 110. He states that is running 100 210 diastolic at home. He was high last week as well I thought he was on 50 mg 1/2 tablet twice daily of losartan  I told him to double up on that however it turns out he is actually on 50 twice daily. Course he is run out of his medications prematurely. He has a follow-up with his primary's office next Wednesday. 6/18; patient's wounds continue to improve. The small area laterally is just about closed and there is continued epithelialization on the area on the posterior calf. 7/2; we applied his last Apligraf last visit. Early the next week there was a lot of purulent looking drainage that I cultured this grew staph and Pseudomonas however when we had him back for the next visit to 3 days later everything looked a lot better. He did not receive systemic antibiotics. Fortunately we did not have to remove the Apligraf. He has had heart trouble this week he was having an ablation apparently they have discovered a clot in his left atrium they have changed all his anticoagulants as well as his blood pressure medication. 7/16; using Hydrofera Blue. We are making nice progress towards closure. He has not yet ordered his compression stockings he has his measurements 7/23; dimensions are better. He has a new satellite area superiorly. Both wounds cauterized with silver  nitrate. 7/30; absolutely no change in the major wound on the posterior part of the right calf. We have been using Hydrofera Blue for a prolonged period of time and really had a nice improvement but over the last few weeks this is  stalled. There is no depth. He arrived in clinic today with a new area on the right anterior tibia which apparently was an ingrown hair. It is clearly an open area. This looks like a wrap injury although he was not really aware of it. 8/31; since the patient was last in clinic he was admitted to hospital from 8/17 through 8/22. He had had multiple falls at home including one off a ladder. He was admitted to hospital with mild COVID-19 viral pneumonia. Noted to be hyperkalemic and in acute renal failure. He has chronic systolic heart failure with ejection fraction of 35%. He arrives back in clinic with the original wound on the posterior aspect of the right lower calf looking a lot better. He has eschared areas from falls and lacerations on his anterior knees bilaterally just below the patella and on the mid part of his tibia bilaterally. He seems to have a small area on the right lateral ankle as well 9/10- Patient comes to clinic with a 2 new wounds  one on the left anterior shin and one on the right posterior leg both the left knee and right knee areas are healed up. We have been using Santyl to the left anterior leg and silver  alginate to right posterior leg wounds. 9/17; the patient is original wound looks good on the posterior right calf. He has traumatic areas on the left anterior shin left anterior patellar tendon and the right mid tibia area. Except for the right posterior calf all required debridement. We have been using Santyl on these areas and silver  alginate posteriorly right calf 9/24; the original wound on the posterior calf on the right looks good. This is healthy. There are traumatic wounds in the left anterior shin, left anterior patella in the right mid tibia area are about the same. He has dusky erythema on the left anterior tibia which led me to give him antibiotics last week. This does not look too much different. This is nontender and I wonder if this is all just venous  inflammation. 10/2; the patient has 2 open areas on the right posterior calf both of these look good we have been using silver  alginate. He has traumatic wounds on the left and right mid tibia areas. The surface of these wounds is still not ready for closure. We have been using Iodoflex. Finally has areas on the left knee. The latter wounds are all traumatic after a fall 10/9. His original wounds of the right posterior calf are superficial and progressing towards closure. His traumatic wounds on the left anterior tibia and right anterior tibia are considerably improved in terms of the wound surface. He asked me about a skin graft in these areas I do not think this is going to be necessary. Change from Iodosorb to Hydrofera Blue. The area on the left knee is just about closed as well 10/16; his original wound on the right posterior calf very superficial looks healthy. He has more recent traumatic wounds on the left anterior tibia and right anterior tibia both of these have better looking surfaces and measuring smaller. Might be beneficial for an advanced treatment product. The area on his left knee has a small superficial scab and we are going to close that when 10/23; his original wound areas on the right posterior calf continue to improve. The more recently traumatic wounds on the left anterior tibia and right anterior tibia both look better in terms of surfaces. No debridement required. We have been using Hydrofera Blue. He is approved for Apligraf 11/3; Apligraf #1 applied to the left anterior tibia right anterior tibia and to 2 small areas remaining on the right posterior calf which were part of his initial wound area. 11/19; Apligraf #2. Left anterior tibia is healed. Right anterior tibia much better. Only one small area remains on the right posterior calf which was part of his initial wound area 12/3; the left anterior tibia has opened up again. Hyper granulated nodule. Right anterior tibia is  superficial and larger. There is no depth of this. I did not place Apligraf today return to Hydrofera Blue under compression 12/10; the patient has 2 remaining wounds 1 on the right posterior and the other on the left anterior. Both of these look quite good. No debridement is required we have been using Hydrofera Blue under compression. 12/17; the patient has really not closed over which is disappointing. His wraps were very tight and there may be some wrap injury issues. On the left he has the original wound on the medial  mid tibia he has a wrap injury on the lateral mid tibia. On the right his posterior areas are superficial but certainly not closed 12/31; the patient has totally closed on the left. He will transition to his own 20/30 below-knee stocking on the left leg today. Interestingly on the right posterior calf the 2 wounds that he had from last time of closed however there is an additional injury in this same area. Almost looks as though there is subcutaneous fluid/blistering. There is no evidence of cellulitis this does not seem tender 06/24/2019; the patient comes in having been placed in his 20/30 stockings on the left leg last week. He states that he noted blisters break open on Saturday and JACARI, IANNELLO ([id number]) [phone].pdf Page 3 of 10 they had opened into wounds on the anterior tibial area on the left by Monday. The area on the posterior right leg looks satisfactory we are still doing that and compression. We are using calcium  alginate to all wound areas 1/14; the patient had juxta light however one of them was not the right size which will need to be returned. The new area last week on the left anterior tibia looks a lot better it measures smaller I would say the area on the right is about the same, this is on the right posterior calf. We have been using silver  alginate 1/21; he has his bilateral juxta lites however the left anterior wound continues  to look better where is the 2 areas on the right posterior calf look about the same. We have been using Hydrofera Blue under compression with the juxta lite stockings now on standby 2/4; the areas on the left are totally healed and he will be discharged into his own stocking/juxta light. On the right posterior calf he has what looks to be a blister with a small area of subdermal hemorrhage this is very tiny but I did I am going to wrap him today and see if this will be reabsorbed by next week 2/11; he comes in this week with expanded wounds on the posterior calf as well as a new wound on the left anterior. He said the left anterior started a few days ago with a blister that is now surface. He is using his juxta lite stockings at a pressure of 30/40 2/19; we wrapped his left leg last week after a failed attempt to discharge that leg and his juxta lites 2 weeks ago. The leg is again healed he has superficial areas posteriorly on the right leg 2/26; the patient came in initially with right posterior calf wound secondary to minor trauma with secondary infection. He went on to develop a left lower leg wound. In the interim he had congestive heart failure with acute renal failure a fall with bilateral anterior leg wounds and finally Covid requiring admission to pneumonia with pneumonia. He is finally closed. He has external compression stockings bilaterally [juxta lite stockings]. His wounds are healed READMISSION 03/03/2023 This is a patient that we had here for a year from 20 20 through 20 21 with a sizable wound on the right posterior calf which was secondary to a combination of chronic venous insufficiency and secondary infection in the wound when he came here. This was a problematic wound in itself however he developed traumatic wounds on his lower extremities after a fall from a ladder during the same admission. Eventually he was healed out and discharged and juxta lite stockings. He tells me that  everything was fine up  until about 2-1/2 months ago he developed a blister and an open area on the left posterior calf this time. He saw his primary doctor and received a course of Bactrim . He has been using Silvadene  with AandE ointment and covering this with gauze. He is here for review of this area. When he was here in 2021 he also saw Dr. Medford Fireman of vein and vascular he had venous reflux studies that showed deep venous reflux but nothing that was amenable to surgery. He also had arterial studies. He was noncompressible on the left but had normal TBIs and triphasic waveforms. In terms of the reflux studies I am not really sure whether the left leg was involved in that study or not we will need to review that. His past medical history includes type type 2 diabetes, chronic venous insufficiency with secondary lymphedema, intermittent A-fib, nonischemic cardiomyopathy, thrombus of the left atrial appendage, hereditary hemochromatosis cytosis. He is now on Coumadin  presumably for the atrial fibrillation. ABIs in this clinic were not repeated 9/24; posterior left calf wound. We used Hydrofera Blue under compression. The wound looks better this week better looking surface. 10/1. Posterior left calf wound secondary to venous insufficiency. He was new to the clinic in 2 weeks ago at which time I thought he was going to require a difficult set of mechanical debridements however the wound surface is continuing to improve using Hydrofera Blue although the dimensions not so remarkably improved today 10/8; posterior left calf wound secondary to chronic venous insufficiency. He continues to think nice progress here on this wound the wound is smaller better looking surface. He arrived in clinic today with an abrasion on his Achilles area on the left leg as well as what looked to be a potential deep tissue injury on the lateral lateral part of the left foot at the level of the base of the fifth  metatarsal. 10/15 posterior left calf wound continues to improve. We have been using Prisma under ABDs under Urgo K2 lite's. The areas on the Achilles and the left lateral foot which were concerning last time of maintain skin integrity. He is not wearing stockings even on the right leg I talked to him about this. 10/22; posterior left calf wound measures slightly larger today. The abrasion injury in the Achilles send on the lateral fifth metatarsal base look improved. We have been using Prisma under compression I am going to change this to [facility] today 10/29; posterior left calf wound continues to progress nicely. Presumably an abrasion injury on the Achilles heel. I changed dressing to Hydrofera Blue. Will need to dress the area on the Achilles today. The area on the fifth metatarsal base is not open 11/5; posterior left calf wound continues to improve nicely presumably an abrasion injury on the left Achilles also looks clean. He has a what looks to be an abrasion on the base of the fifth metatarsal it is not open but it looks as though there is been some friction on this area we will pad this today under the wrap. Patient with chronic venous insufficiency severe hemosiderin deposition and resultant wounds. He has been in the clinic before however that was for a wound on the right posterior calf. Notable he is not wearing stockings even on the uninvolved right leg today 11/11; his original wound on the posterior left calf continues to improve in dimensions. Once again he comes in today with some black eschar around the edge I wonder if this is bleeding. He  has the area on the Achilles and now a new area on the left upper lower leg which he says he scratched. And finally he has an area on the right anterior mid tibia which he said he got from taking off his stocking 11/19; he has wounds on his bilateral lower legs. We have been wrapping with Urgo K2's. He has original wound on the left  posterior calf, probably a wrap injury on the left lateral calf, area on the left Achilles, and finally the right medial lower leg. We have been using Hydrofera Blue 12/3; came in this with.a wound on his left posterior leg. since then her has developed a more distal left wound and a right anterior. A wound of the left lateral has healed. We are using HFB with Urgo K2 12/10; the patient initially came in with a wound on his left posterior leg. This is healed today. He subsequently developed an area on the left Achilles heel and an area on the right anterior mid tibia area. We have been using Hydrofera Blue however intake reports that these are getting stuck to the wound. Also he has an area of painful erythema around the posterior left Achilles area. This could be rubbing in the wrap however I think coverage with antibiotics for cellulitis is certainly indicated we will be changing the primary dressing to polymen silver  12/17; his left posterior leg wound which was his original wound has remained closed. However he subsequently developed an area on the left Achilles heel and the right anterior mid tibia. Last week the area on the Achilles heel had surrounding erythema I gave him a course of doxycycline  and this seems to have resolved at least in terms of the erythema. He still however having discomfort in this wound. The area on the right anterior mid tibia looks really quite healthy. We have been polymen silver . 20/24; his original wound on the left posterior lower leg is closed he had a secondary wound just on the superior part of his Achilles tendon this looks better this week no erythema. His last wound is on the right anterior mid tibia. All of this looks quite good. We have been using polymen silver  under compression. 06/23/2023; patient presents for follow-up. The right lower extremity wound is healed today. He has compression stockings with him. He has reopened a wound to the left posterior leg.  He has 2 wounds on the left lower extremity. We have been using PolyMem silver  under compression therapy to the wound beds. Patient has no issues or complaints today. He denies signs of infection. Electronic Signature(s) JADAVION, SPOELSTRA ([id number]) [id number] Page 4 of 10 Signed: 06/24/2023 5:26:35 PM By: Rosan Raisin DO Entered By: Rosan Raisin on 06/23/2023 13:40:01 -------------------------------------------------------------------------------- Physical Exam Details Patient Name: Date of Service: ROARKE, MARCIANO NDY J. 06/23/2023 1:00 PM Medical Record Number: [id number] Patient Account Number: [id number] Date of Birth/Sex: Treating RN: [birth year] (68 y.o. M) Primary Care Provider: Rollene Norris Other Clinician: Referring Provider: Treating Provider/Extender: Rosan Raisin Rollene Norris Devra in Treatment: 16 Constitutional respirations regular, non-labored and within target range for patient.. Cardiovascular 2+ dorsalis pedis/posterior tibialis pulses. Psychiatric pleasant and cooperative. Notes Right lower extremity: Epithelization to the previous wound site on the right anterior mid tibia Left lower extremity: T the posterior calf there is an open wound limited to skin breakdown and another wound with granulation tissue to the Achilles region. No o surrounding signs of infection to any of the wound beds. Electronic Signature(s) Signed: 06/24/2023 5:26:35 PM  By: Rosan Raisin DO Entered By: Rosan Raisin on 06/23/2023 13:40:53 -------------------------------------------------------------------------------- Physician Orders Details Patient Name: Date of Service: Schachter, [name] 06/23/2023 1:00 PM Medical Record Number: [id number] Patient Account Number: [id number] Date of Birth/Sex: Treating RN: 20-Apr-1956 (68 y.o. M) Primary Care Provider: Rollene Norris Other Clinician: Referring Provider: Treating Provider/Extender:  Rosan Raisin Rollene Norris Devra in Treatment: [phone] Verbal / Phone Orders: No Diagnosis Coding Follow-up Appointments ppointment in 2 weeks. - Dr .Rosan (front office to schedule) Return A Return appointment in 3 weeks. - Dr .Rosan (front office to schedule) Nurse Visit: - 06/16/23 215pm (already scheduled) Anesthetic (In clinic) Topical Lidocaine  4% applied to wound bed Bathing/ Shower/ Hygiene May shower with protection but do not get wound dressing(s) wet. Protect dressing(s) with water repellant cover (for example, large plastic bag) or a cast cover and may then take shower. Edema Control - Orders / Instructions Elevate legs to the level of the heart or above for 30 minutes daily and/or when sitting for 3-4 times a day throughout the day. Avoid standing for long periods of time. Exercise regularly Wound Treatment JESSEJAMES, STEELMAN ([id number]) [phone].pdf Page 5 of 10 Wound #14 - Achilles Wound Laterality: Left, Posterior Cleanser: Soap and Water 1 x Per Week/15 Days Discharge Instructions: May shower and wash wound with dial antibacterial soap and water prior to dressing change. Cleanser: Vashe 5.8 (oz) 1 x Per Week/15 Days Discharge Instructions: Cleanse the wound with Vashe prior to applying a clean dressing using gauze sponges, not tissue or cotton balls. Peri-Wound Care: Sween Lotion (Moisturizing lotion) 1 x Per Week/15 Days Discharge Instructions: Apply moisturizing lotion as directed Prim Dressing: PolyMem Silver  Non-Adhesive Dressing, 4.25x4.25 in 1 x Per Week/15 Days ary Discharge Instructions: Apply to wound bed as instructed Secondary Dressing: ABD Pad, 8x10 1 x Per Week/15 Days Discharge Instructions: Apply over primary dressing as directed. Compression Wrap: Urgo K2, (equivalent to a 4 layer) two layer compression system, regular 1 x Per Week/15 Days Discharge Instructions: Apply Urgo K2 as directed (alternative to 4 layer  compression). Electronic Signature(s) Signed: 06/24/2023 5:26:35 PM By: Rosan Raisin DO Entered By: Rosan Raisin on 06/23/2023 13:41:03 -------------------------------------------------------------------------------- Problem List Details Patient Name: Date of Service: [name], [name] 06/23/2023 1:00 PM Medical Record Number: [id number] Patient Account Number: [id number] Date of Birth/Sex: Treating RN: 07/02/1955 (68 y.o. M) Primary Care Provider: Rollene Norris Other Clinician: Referring Provider: Treating Provider/Extender: Rosan Raisin Rollene Norris Devra in Treatment: 16 Active Problems ICD-10 Encounter Code Description Active Date MDM Diagnosis I87.332 Chronic venous hypertension (idiopathic) with ulcer and inflammation of left 03/03/2023 No Yes lower extremity L97.328 Non-pressure chronic ulcer of left ankle with other specified severity 04/14/2023 No Yes L97.818 Non-pressure chronic ulcer of other part of right lower leg with other specified 04/27/2023 No Yes severity E11.622 Type 2 diabetes mellitus with other skin ulcer 03/03/2023 No Yes L97.821 Non-pressure chronic ulcer of other part of left lower leg limited to breakdown 06/23/2023 No Yes of skin Inactive Problems ICD-10 Code Description Active Date Inactive Date RANDOM, DOBROWSKI ([id number]) [id number] Page 6 of 10 [phone] Non-pressure chronic ulcer of other part of left lower leg with other specified severity 03/03/2023 03/03/2023 L03.116 Cellulitis of left lower limb 05/26/2023 05/26/2023 Resolved Problems Electronic Signature(s) Signed: 06/24/2023 5:26:35 PM By: Rosan Raisin DO Entered By: Rosan Raisin on 06/23/2023 13:37:46 -------------------------------------------------------------------------------- Progress Note Details Patient Name: Date of Service: [name], [name] 06/23/2023 1:00 PM Medical Record Number: [id number] Patient Account Number:  [id number] Date  of Birth/Sex: Treating RN: 1955-10-16 (68 y.o. M) Primary Care Provider: Rollene Norris Other Clinician: Referring Provider: Treating Provider/Extender: Rosan Harlene Rollene Norris Devra in Treatment: 16 Subjective Chief Complaint Information obtained from Patient 07/29/2018; patient is here for review of a fairly sizable wound on his right posterior calf 03/14/16/24; patient is here for review of a sizable wound on the left posterior calf History of Present Illness (HPI) ADMISSION 07/29/2018 Mr. Hosang is a 68 year old man with either prediabetes or diabetes he is on glipizide . In late October to November 2019 he noted a scabbed area on the back of his right calf. He picked this off a few times but it would not heal. He saw his primary physician on 12/5. It was felt at that time that this may actually heal on its own with conservative management. The next visit was on 07/20/2018 noted the area was a lot worse and arranged for his treatment here. He has been topical antibiotics like Neosporin although he stopped using this when the wound looked worse. He is just been covering this with a clean Band-Aid. He is given Bactrim  a week ago and he is finishing this currently. It is made some improvement in the surrounding erythema per the patient. He does not have a history of nonhealing wounds. No prior history of wounds on his legs that he had difficulty healing. No prior skin issues. 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 Past medical history; A. fib status post ablation and recent cardioversion he is on Xarelto . He has a history of systolic heart failure, cervical radiculopathy, cardiomyopathy and type 2 diabetes as discussed ABI in the right leg was noncompressible on the right 2/20; the biopsy I did on the patient last week was negative for malignancy. Culture grew Pseudomonas and methicillin sensitive staph aureus. He is on cefdinir 300 twice a day.  I will have to make this a 10 day course. He put him in compression. He states that the redness pain and erythema are a lot better. I suspect the patient has chronic venous insufficiency probably with a secondary cellulitis 2/27; arrives today with copious amounts of drainage coming out of the wound irritating the skin below the wound area. He only renewed the final 3 days of cefdinir today 3/5; came in for a nurse change 3 days ago. Again a lot of drainage noted. Zinc oxide was applied unfortunately the drainage appears to a pool then he has a string of superficial open areas extending down into the Achilles area and some just below the wound. The wound itself does not look too bad. He is completed the antibiotics although apparently there was separation from the original 7 with the last 3 days. Culture grew a few MSSA and a few Pseudomonas 3/12; not too much difference over the last week. He came in for a dressing change on Monday by our nursing staff. Necrotic debris again over the wound surface. The entire area looks irritated but nontender and I do not think shows obvious evidence of infection. We have not heard anything about the reflux studies. Also notable than in this diabetic man we had noncompressible vessels and although I can feel his pulses easily in his feet I will order arterial studies as well. 3/19-Patient had experience more pain has been dressed with a silver  alginate, the wounds all have necrotic debris, very friable with easy bleeding with any kind of surface debridement including with gauze and Anasept and with a #3 curette. His  vascular appointments unfortunately were all canceled on account of the virus outbreak resulting in studies only being done for emergent cases. His pulses are easily palpable in the lower extremity. He has open areas below the primary wound where he states the drainage which included purulent material also with some blood pooled and caused  breakdown. 3/26; the patient was changed to Santyl with Hydrofera Blue backing last week. This was largely because the silver  alginate was sticking to the wound. He is still having quite a bit of pain. He tells us  that the reflux studies and arterial studies we had attempted to arrange through vein and vascular are not being booked until mid May. Culture that was done last week showed both Serratia moderate and a few methicillin sensitive staph aureus I started him on Bactrim  DS 1 p.o. twice daily on 3/23 for 7 days. He is here for follow-up. 4/3; patient is on Santyl with Hydrofera Blue. Patient complains of pain. Our intake nurse is noted drainage and some odor. He has completed Bactrim  recently for Serratia and a few methicillin sensitive staph aureus. After our staff push hard last week we were able to get venous studies as well as arterial studies. His arterial studies showed on the right great toe pressure of 0.86. On the left ABI at 1.47 TBI of 0.87. On both sides waveforms were triphasic VENOUS STUDIES showed reflux in the femoral vein, popliteal vein great saphenous vein at the saphenofemoral junction and great saphenous vein at the proximal thigh. Also noted to have age-indeterminate superficial vein thrombosis involving the small saphenous vein. Notable that the patient is already on Angustura ([id number]) [phone].pdf Page 7 of 10 Xarelto  for atrial fibrillation. 4/9; the patient has been to see vascular surgery and reviewed by Dr. Melvenia. He was felt to have only minimal superficial venous reflux in the right great greater saphenous vein. For this reason he was not felt to be a candidate for laser ablation of the right greater saphenous vein. A wedge cushion was suggested to keep his leg elevated. He does have deep venous reflux on the right. Culture I did last time showed a combination of Serratia Pseudomonas and methicillin sensitive staph aureus. We  have been using Hydrofera Blue to the large wound, Santyl to some of the deep punched out satellite lesions distal to it. There is some improvement 4/16; the application for Apligraf was put out for further review for material that we have resubmitted. He has the deep area on the right posterior calf which is large and then 3 small punched out areas beneath this. In general his wounds look a lot better 4/23; the patient has his large original wound and then 2 punched out satellite lesions below it on the right posterior calf. I was usually able to use Apligraf #1 to cover the full surface area of the larger wound. We continued with Santyl and Hydrofera Blue on the 2 punched-out areas 5/7; patient has his large original wound and 2 punched out satellite lesions below it in the right posterior calf. Apligraf #2 today. Major improvement in the big wound in terms of wound depth. Punched-out Warren wounds have a very healthy looking wound bed. 5/21-Patient comes back for his large right posterior calf wound for which she has been an Apligraf #3 today. Wound depth seems to be better, the punched-out wounds appear to have healthy bed with some bleeding 6/4; right posterior calf wound is much better. Apligraf #4 today. The major wound has  no wound depth at all. Only 1 of the satellite lesions is still open. The patient has very high blood pressure coming in today with a diastolic blood pressure of 130 after lying there for a few minutes it was down to 110. He states that is running 100 210 diastolic at home. He was high last week as well I thought he was on 50 mg 1/2 tablet twice daily of losartan  I told him to double up on that however it turns out he is actually on 50 twice daily. Course he is run out of his medications prematurely. He has a follow-up with his primary's office next Wednesday. 6/18; patient's wounds continue to improve. The small area laterally is just about closed and there is continued  epithelialization on the area on the posterior calf. 7/2; we applied his last Apligraf last visit. Early the next week there was a lot of purulent looking drainage that I cultured this grew staph and Pseudomonas however when we had him back for the next visit to 3 days later everything looked a lot better. He did not receive systemic antibiotics. Fortunately we did not have to remove the Apligraf. He has had heart trouble this week he was having an ablation apparently they have discovered a clot in his left atrium they have changed all his anticoagulants as well as his blood pressure medication. 7/16; using Hydrofera Blue. We are making nice progress towards closure. He has not yet ordered his compression stockings he has his measurements 7/23; dimensions are better. He has a new satellite area superiorly. Both wounds cauterized with silver  nitrate. 7/30; absolutely no change in the major wound on the posterior part of the right calf. We have been using Hydrofera Blue for a prolonged period of time and really had a nice improvement but over the last few weeks this is stalled. There is no depth. He arrived in clinic today with a new area on the right anterior tibia which apparently was an ingrown hair. It is clearly an open area. This looks like a wrap injury although he was not really aware of it. 8/31; since the patient was last in clinic he was admitted to hospital from 8/17 through 8/22. He had had multiple falls at home including one off a ladder. He was admitted to hospital with mild COVID-19 viral pneumonia. Noted to be hyperkalemic and in acute renal failure. He has chronic systolic heart failure with ejection fraction of 35%. He arrives back in clinic with the original wound on the posterior aspect of the right lower calf looking a lot better. He has eschared areas from falls and lacerations on his anterior knees bilaterally just below the patella and on the mid part of his tibia  bilaterally. He seems to have a small area on the right lateral ankle as well 9/10- Patient comes to clinic with a 2 new wounds one on the left anterior shin and one on the right posterior leg both the left knee and right knee areas are healed up. We have been using Santyl to the left anterior leg and silver  alginate to right posterior leg wounds. 9/17; the patient is original wound looks good on the posterior right calf. He has traumatic areas on the left anterior shin left anterior patellar tendon and the right mid tibia area. Except for the right posterior calf all required debridement. We have been using Santyl on these areas and silver  alginate posteriorly right calf 9/24; the original wound on the posterior calf on  the right looks good. This is healthy. There are traumatic wounds in the left anterior shin, left anterior patella in the right mid tibia area are about the same. He has dusky erythema on the left anterior tibia which led me to give him antibiotics last week. This does not look too much different. This is nontender and I wonder if this is all just venous inflammation. 10/2; the patient has 2 open areas on the right posterior calf both of these look good we have been using silver  alginate. He has traumatic wounds on the left and right mid tibia areas. The surface of these wounds is still not ready for closure. We have been using Iodoflex. Finally has areas on the left knee. The latter wounds are all traumatic after a fall 10/9. His original wounds of the right posterior calf are superficial and progressing towards closure. His traumatic wounds on the left anterior tibia and right anterior tibia are considerably improved in terms of the wound surface. He asked me about a skin graft in these areas I do not think this is going to be necessary. Change from Iodosorb to Hydrofera Blue. The area on the left knee is just about closed as well 10/16; his original wound on the right posterior  calf very superficial looks healthy. He has more recent traumatic wounds on the left anterior tibia and right anterior tibia both of these have better looking surfaces and measuring smaller. Might be beneficial for an advanced treatment product. The area on his left knee has a small superficial scab and we are going to close that when 10/23; his original wound areas on the right posterior calf continue to improve. The more recently traumatic wounds on the left anterior tibia and right anterior tibia both look better in terms of surfaces. No debridement required. We have been using Hydrofera Blue. He is approved for Apligraf 11/3; Apligraf #1 applied to the left anterior tibia right anterior tibia and to 2 small areas remaining on the right posterior calf which were part of his initial wound area. 11/19; Apligraf #2. Left anterior tibia is healed. Right anterior tibia much better. Only one small area remains on the right posterior calf which was part of his initial wound area 12/3; the left anterior tibia has opened up again. Hyper granulated nodule. Right anterior tibia is superficial and larger. There is no depth of this. I did not place Apligraf today return to Hydrofera Blue under compression 12/10; the patient has 2 remaining wounds 1 on the right posterior and the other on the left anterior. Both of these look quite good. No debridement is required we have been using Hydrofera Blue under compression. 12/17; the patient has really not closed over which is disappointing. His wraps were very tight and there may be some wrap injury issues. On the left he has the original wound on the medial mid tibia he has a wrap injury on the lateral mid tibia. On the right his posterior areas are superficial but certainly not closed 12/31; the patient has totally closed on the left. He will transition to his own 20/30 below-knee stocking on the left leg today. Interestingly on the right posterior calf the 2  wounds that he had from last time of closed however there is an additional injury in this same area. Almost looks as though there is subcutaneous fluid/blistering. There is no evidence of cellulitis this does not seem tender 06/24/2019; the patient comes in having been placed in his 20/30 stockings on  the left leg last week. He states that he noted blisters break open on Saturday and they had opened into wounds on the anterior tibial area on the left by Monday. The area on the posterior right leg looks satisfactory we are still doing that and compression. We are using calcium  alginate to all wound areas 1/14; the patient had juxta light however one of them was not the right size which will need to be returned. The new area last week on the left anterior tibia looks a lot better it measures smaller I would say the area on the right is about the same, this is on the right posterior calf. We have been using silver  alginate 1/21; he has his bilateral juxta lites however the left anterior wound continues to look better where is the 2 areas on the right posterior calf look about the same. We have been using Hydrofera Blue under compression with the juxta lite stockings now on standby 2/4; the areas on the left are totally healed and he will be discharged into his own stocking/juxta light. On the right posterior calf he has what looks to be a blister with a small area of subdermal hemorrhage this is very tiny but I did I am going to wrap him today and see if this will be reabsorbed by next week 2/11; he comes in this week with expanded wounds on the posterior calf as well as a new wound on the left anterior. He said the left anterior started a few days ago with a blister that is now surface. He is using his juxta lite stockings at a pressure of 30/40 2/19; we wrapped his left leg last week after a failed attempt to discharge that leg and his juxta lites 2 weeks ago. The leg is again healed he has  superficial areas posteriorly on the right leg 2/26; the patient came in initially with right posterior calf wound secondary to minor trauma with secondary infection. He went on to develop a left lower leg FREMONT, SKALICKY ([id number]) [id number] Page 8 of 10 wound. In the interim he had congestive heart failure with acute renal failure a fall with bilateral anterior leg wounds and finally Covid requiring admission to pneumonia with pneumonia. He is finally closed. He has external compression stockings bilaterally [juxta lite stockings]. His wounds are healed READMISSION 03/03/2023 This is a patient that we had here for a year from 20 20 through 20 21 with a sizable wound on the right posterior calf which was secondary to a combination of chronic venous insufficiency and secondary infection in the wound when he came here. This was a problematic wound in itself however he developed traumatic wounds on his lower extremities after a fall from a ladder during the same admission. Eventually he was healed out and discharged and juxta lite stockings. He tells me that everything was fine up until about 2-1/2 months ago he developed a blister and an open area on the left posterior calf this time. He saw his primary doctor and received a course of Bactrim . He has been using Silvadene  with AandE ointment and covering this with gauze. He is here for review of this area. When he was here in 2021 he also saw Dr. Medford Fireman of vein and vascular he had venous reflux studies that showed deep venous reflux but nothing that was amenable to surgery. He also had arterial studies. He was noncompressible on the left but had normal TBIs and triphasic waveforms. In terms of the  reflux studies I am not really sure whether the left leg was involved in that study or not we will need to review that. His past medical history includes type type 2 diabetes, chronic venous insufficiency with secondary  lymphedema, intermittent A-fib, nonischemic cardiomyopathy, thrombus of the left atrial appendage, hereditary hemochromatosis cytosis. He is now on Coumadin  presumably for the atrial fibrillation. ABIs in this clinic were not repeated 9/24; posterior left calf wound. We used Hydrofera Blue under compression. The wound looks better this week better looking surface. 10/1. Posterior left calf wound secondary to venous insufficiency. He was new to the clinic in 2 weeks ago at which time I thought he was going to require a difficult set of mechanical debridements however the wound surface is continuing to improve using Hydrofera Blue although the dimensions not so remarkably improved today 10/8; posterior left calf wound secondary to chronic venous insufficiency. He continues to think nice progress here on this wound the wound is smaller better looking surface. He arrived in clinic today with an abrasion on his Achilles area on the left leg as well as what looked to be a potential deep tissue injury on the lateral lateral part of the left foot at the level of the base of the fifth metatarsal. 10/15 posterior left calf wound continues to improve. We have been using Prisma under ABDs under Urgo K2 lite's. The areas on the Achilles and the left lateral foot which were concerning last time of maintain skin integrity. He is not wearing stockings even on the right leg I talked to him about this. 10/22; posterior left calf wound measures slightly larger today. The abrasion injury in the Achilles send on the lateral fifth metatarsal base look improved. We have been using Prisma under compression I am going to change this to [facility] today 10/29; posterior left calf wound continues to progress nicely. Presumably an abrasion injury on the Achilles heel. I changed dressing to Hydrofera Blue. Will need to dress the area on the Achilles today. The area on the fifth metatarsal base is not open 11/5; posterior  left calf wound continues to improve nicely presumably an abrasion injury on the left Achilles also looks clean. He has a what looks to be an abrasion on the base of the fifth metatarsal it is not open but it looks as though there is been some friction on this area we will pad this today under the wrap. Patient with chronic venous insufficiency severe hemosiderin deposition and resultant wounds. He has been in the clinic before however that was for a wound on the right posterior calf. Notable he is not wearing stockings even on the uninvolved right leg today 11/11; his original wound on the posterior left calf continues to improve in dimensions. Once again he comes in today with some black eschar around the edge I wonder if this is bleeding. He has the area on the Achilles and now a new area on the left upper lower leg which he says he scratched. And finally he has an area on the right anterior mid tibia which he said he got from taking off his stocking 11/19; he has wounds on his bilateral lower legs. We have been wrapping with Urgo K2's. He has original wound on the left posterior calf, probably a wrap injury on the left lateral calf, area on the left Achilles, and finally the right medial lower leg. We have been using Hydrofera Blue 12/3; came in this with.a wound on his left posterior  leg. since then her has developed a more distal left wound and a right anterior. A wound of the left lateral has healed. We are using HFB with Urgo K2 12/10; the patient initially came in with a wound on his left posterior leg. This is healed today. He subsequently developed an area on the left Achilles heel and an area on the right anterior mid tibia area. We have been using Hydrofera Blue however intake reports that these are getting stuck to the wound. Also he has an area of painful erythema around the posterior left Achilles area. This could be rubbing in the wrap however I think coverage with antibiotics  for cellulitis is certainly indicated we will be changing the primary dressing to polymen silver  12/17; his left posterior leg wound which was his original wound has remained closed. However he subsequently developed an area on the left Achilles heel and the right anterior mid tibia. Last week the area on the Achilles heel had surrounding erythema I gave him a course of doxycycline  and this seems to have resolved at least in terms of the erythema. He still however having discomfort in this wound. The area on the right anterior mid tibia looks really quite healthy. We have been polymen silver . 20/24; his original wound on the left posterior lower leg is closed he had a secondary wound just on the superior part of his Achilles tendon this looks better this week no erythema. His last wound is on the right anterior mid tibia. All of this looks quite good. We have been using polymen silver  under compression. 06/23/2023; patient presents for follow-up. The right lower extremity wound is healed today. He has compression stockings with him. He has reopened a wound to the left posterior leg. He has 2 wounds on the left lower extremity. We have been using PolyMem silver  under compression therapy to the wound beds. Patient has no issues or complaints today. He denies signs of infection. Objective Constitutional respirations regular, non-labored and within target range for patient.. Vitals Time Taken: 1:00 PM, Height: 73 in, Weight: 254 lbs, BMI: 33.5, Temperature: 97.9 F, Pulse: 68 bpm, Respiratory Rate: 16 breaths/min, Blood Pressure: 111/67 mmHg. AKSHAJ, BESANCON ([id number]) [id number] Page 9 of 10 Cardiovascular 2+ dorsalis pedis/posterior tibialis pulses. Psychiatric pleasant and cooperative. General Notes: Right lower extremity: Epithelization to the previous wound site on the right anterior mid tibia Left lower extremity: T the posterior calf there is o an open wound  limited to skin breakdown and another wound with granulation tissue to the Achilles region. No surrounding signs of infection to any of the wound beds. Integumentary (Hair, Skin) Wound #13 status is Open. Original cause of wound was Blister. The date acquired was: 01/20/2023. The wound has been in treatment 16 weeks. The wound is located on the Left,Posterior Lower Leg. The wound measures 1.8cm length x 1.5cm width x 0.1cm depth; 2.121cm^2 area and 0.212cm^3 volume. There is Fat Layer (Subcutaneous Tissue) exposed. There is no tunneling or undermining noted. There is a none present amount of drainage noted. The wound margin is distinct with the outline attached to the wound base. There is medium (34-66%) granulation within the wound bed. There is a medium (34-66%) amount of necrotic tissue within the wound bed. The periwound skin appearance had no abnormalities noted for moisture. The periwound skin appearance exhibited: Scarring, Hemosiderin Staining. Periwound temperature was noted as No Abnormality. Wound #14 status is Open. Original cause of wound was Blister. The date acquired was: 04/07/2023. The  wound has been in treatment 10 weeks. The wound is located on the Left,Posterior Achilles. The wound measures 1.5cm length x 2.7cm width x 0.1cm depth; 3.181cm^2 area and 0.318cm^3 volume. There is Fat Layer (Subcutaneous Tissue) exposed. There is no tunneling or undermining noted. There is a medium amount of serosanguineous drainage noted. The wound margin is distinct with the outline attached to the wound base. There is large (67-100%) red granulation within the wound bed. There is a small (1-33%) amount of necrotic tissue within the wound bed including Adherent Slough. The periwound skin appearance had no abnormalities noted for moisture. The periwound skin appearance exhibited: Scarring, Hemosiderin Staining. Periwound temperature was noted as No Abnormality. Wound #15 status is Healed -  Epithelialized. Original cause of wound was Trauma. The date acquired was: 02/25/2023. The wound has been in treatment 8 weeks. The wound is located on the Right,Anterior Lower Leg. The wound measures 0cm length x 0cm width x 0cm depth; 0cm^2 area and 0cm^3 volume. There is Fat Layer (Subcutaneous Tissue) exposed. There is a medium amount of serosanguineous drainage noted. The wound margin is distinct with the outline attached to the wound base. There is large (67-100%) red granulation within the wound bed. There is no necrotic tissue within the wound bed. The periwound skin appearance had no abnormalities noted for texture. The periwound skin appearance had no abnormalities noted for moisture. The periwound skin appearance exhibited: Hemosiderin Staining. The periwound skin appearance did not exhibit: Atrophie Blanche, Cyanosis, Ecchymosis, Mottled, Pallor, Rubor, Erythema. Periwound temperature was noted as No Abnormality. 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 I recommend using compression stockings daily here. The left lower extremity Achilles wound appears well- healing. He has reopened a wound to the posterior left leg that is limited to skin breakdown . I recommended continuing PolyMem silver  now to both areas and continued compression therapy to the left leg. No signs of infection. Follow-up in 1 week Procedures Wound #13 Pre-procedure diagnosis of Wound #13 is a Venous Leg Ulcer located on the Left,Posterior Lower Leg . There was a Four Layer Compression  Therapy Procedure Post procedure Diagnosis Wound #13: Same as Pre-Procedure Wound #14 Pre-procedure diagnosis of Wound #14 is a Diabetic Wound/Ulcer of the Lower Extremity located on the Left,Posterior Achilles . There was a Four Layer Compression Therapy Procedure Post procedure Diagnosis Wound #14: Same as Pre-Procedure Plan Follow-up Appointments: Return Appointment in 2 weeks. - Dr .Rosan (front office to schedule) Return appointment in 3 weeks. - Dr .Rosan (front office to schedule) Nurse Visit: - 06/16/23 215pm (already scheduled) Anesthetic: (In clinic) Topical Lidocaine  4% applied to wound bed Bathing/ Shower/ Hygiene: May shower with protection but do not get wound dressing(s) wet. Protect dressing(s) with water repellant cover (for example, large plastic bag) or a cast cover CHRISTIE, COPLEY ([id number]) [id number] Page 10 of 10 and may then take shower. Edema Control - Orders / Instructions: Elevate legs to the level of the heart or above for 30 minutes daily and/or when sitting for 3-4 times a day throughout the day. Avoid standing for long periods of time. Exercise regularly WOUND #14: - Achilles  Wound Laterality: Left, Posterior Cleanser: Soap and Water 1 x Per Week/15 Days Discharge Instructions: May shower and wash wound with dial antibacterial soap and water prior to dressing change. Cleanser: Vashe 5.8 (oz) 1 x Per Week/15 Days Discharge Instructions: Cleanse the wound with Vashe prior to applying a clean dressing using gauze sponges, not tissue or cotton balls. Peri-Wound Care: Sween Lotion (Moisturizing lotion) 1 x Per Week/15 Days Discharge Instructions: Apply moisturizing lotion as directed Prim Dressing: PolyMem Silver  Non-Adhesive Dressing, 4.25x4.25 in 1 x Per Week/15 Days ary Discharge Instructions: Apply to wound bed as instructed Secondary Dressing: ABD Pad, 8x10 1 x Per Week/15 Days Discharge Instructions: Apply over  primary dressing as directed. Com pression Wrap: Urgo K2, (equivalent to a 4 layer) two layer compression system, regular 1 x Per Week/15 Days Discharge Instructions: Apply Urgo K2 as directed (alternative to 4 layer compression). 1. PolyMem silver  under compression therapyleft lower extremity 2. Compression stocking dailyright lower extremity 3. Follow-up in 1 week Electronic Signature(s) Signed: 06/24/2023 5:26:35 PM By: Rosan Raisin DO Entered By: Rosan Raisin on 06/23/2023 13:43:36 -------------------------------------------------------------------------------- SuperBill Details Patient Name: Date of Service: [name] OPPENHEIM NDY J. 06/23/2023 Medical Record Number: [id number] Patient Account Number: [id number] Date of Birth/Sex: Treating RN: 1956-05-03 (68 y.o. M) Primary Care Provider: Rollene Norris Other Clinician: Referring Provider: Treating Provider/Extender: Rosan Raisin Rollene Norris Devra in Treatment: 16 Diagnosis Coding ICD-10 Codes Code Description [phone] Non-pressure chronic ulcer of other part of left lower leg with other specified severity I87.332 Chronic venous hypertension (idiopathic) with ulcer and inflammation of left lower extremity L97.328 Non-pressure chronic ulcer of left ankle with other specified severity E11.622 Type 2 diabetes mellitus with other skin ulcer L97.818 Non-pressure chronic ulcer of other part of right lower leg with other specified severity Physician Procedures : CPT4 Code Description Modifier [id number] [phone] - WC PHYS LEVEL 2 - EST PT ICD-10 Diagnosis Description L97.828 Non-pressure chronic ulcer of other part of left lower leg with other specified severity I87.332 Chronic venous hypertension (idiopathic) with  ulcer and inflammation of left lower extremity L97.328 Non-pressure chronic ulcer of left ankle with other specified severity E11.622 Type 2 diabetes mellitus with other skin ulcer Quantity: 1 Electronic  Signature(s) Signed: 06/24/2023 5:26:35 PM By: Rosan Raisin DO Entered By: Rosan Raisin on 06/23/2023 13:44:25

## 2023-06-30 ENCOUNTER — Encounter (HOSPITAL_BASED_OUTPATIENT_CLINIC_OR_DEPARTMENT_OTHER): Payer: Medicare HMO | Admitting: Internal Medicine

## 2023-06-30 DIAGNOSIS — L97818 Non-pressure chronic ulcer of other part of right lower leg with other specified severity: Secondary | ICD-10-CM | POA: Diagnosis not present

## 2023-06-30 DIAGNOSIS — I110 Hypertensive heart disease with heart failure: Secondary | ICD-10-CM | POA: Diagnosis not present

## 2023-06-30 DIAGNOSIS — L97821 Non-pressure chronic ulcer of other part of left lower leg limited to breakdown of skin: Secondary | ICD-10-CM | POA: Diagnosis not present

## 2023-06-30 DIAGNOSIS — I480 Paroxysmal atrial fibrillation: Secondary | ICD-10-CM | POA: Diagnosis not present

## 2023-06-30 DIAGNOSIS — E11622 Type 2 diabetes mellitus with other skin ulcer: Secondary | ICD-10-CM

## 2023-06-30 DIAGNOSIS — I428 Other cardiomyopathies: Secondary | ICD-10-CM | POA: Diagnosis not present

## 2023-06-30 DIAGNOSIS — L97828 Non-pressure chronic ulcer of other part of left lower leg with other specified severity: Secondary | ICD-10-CM | POA: Diagnosis not present

## 2023-06-30 DIAGNOSIS — I87332 Chronic venous hypertension (idiopathic) with ulcer and inflammation of left lower extremity: Secondary | ICD-10-CM | POA: Diagnosis not present

## 2023-06-30 DIAGNOSIS — L97328 Non-pressure chronic ulcer of left ankle with other specified severity: Secondary | ICD-10-CM | POA: Diagnosis not present

## 2023-07-01 NOTE — Progress Notes (Signed)
 [name], [name] ([id number]) [id number] Page 1 of 11 Visit Report for 06/30/2023 Chief Complaint Document Details Patient Name: Date of Service: [name], [name] 06/30/2023 1:30 PM Medical Record Number: [id number] Patient Account Number: [id number] Date of Birth/Sex: Treating RN: Sep 27, 1955 (68 y.o. M) Primary Care Provider: Rollene [name] Other Clinician: Referring Provider: Treating Provider/Extender: [name] [name] [name] in Treatment: 17 Information Obtained from: Patient Chief Complaint 07/29/2018; patient is here for review of a fairly sizable wound on his right posterior calf 03/14/16/24; patient is here for review of a sizable wound on the left posterior calf Electronic Signature(s) Signed: 06/30/2023 3:07:10 PM By: [name] Harlene DO Entered By: [name] on 06/30/2023 13:41:22 -------------------------------------------------------------------------------- HPI Details Patient Name: Date of Service: [name], [name] [name] J. 06/30/2023 1:30 PM Medical Record Number: [id number] Patient Account Number: [id number] Date of Birth/Sex: Treating RN: Jul 12, 1955 (68 y.o. M) Primary Care Provider: Rollene [name] Other Clinician: Referring Provider: Treating Provider/Extender: [name] [name] [name] in Treatment: 17 History of Present Illness HPI Description: ADMISSION 07/29/2018 [name] is a 68 year old man with either prediabetes or diabetes he is on glipizide . In late October to November 2019 he noted a scabbed area on the back of his right calf. He picked this off a few times but it would not heal. He saw his primary physician on 12/5. It was felt at that time that this may actually heal on its own with conservative management. The next visit was on 07/20/2018 noted the area was a lot worse and arranged for his treatment here. He has been topical antibiotics like Neosporin although he stopped using this  when the wound looked worse. He is just been covering this with a clean Band-Aid. He is given Bactrim  a week ago and he is finishing this currently. It is made some improvement in the surrounding erythema per the patient. He does not have a history of nonhealing wounds. No prior history of wounds on his legs that he had difficulty healing. No prior skin issues. 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 Past medical history; A. fib status post ablation and recent cardioversion he is on Xarelto . He has a history of systolic heart failure, cervical radiculopathy, cardiomyopathy and type 2 diabetes as discussed ABI in the right leg was noncompressible on the right 2/20; the biopsy I did on the patient last week was negative for malignancy. Culture grew Pseudomonas and methicillin sensitive staph aureus. He is on cefdinir 300 twice a day. I will have to make this a 10 day course. He put him in compression. He states that the redness pain and erythema are a lot better. I suspect the patient has chronic venous insufficiency probably with a secondary cellulitis 2/27; arrives today with copious amounts of drainage coming out of the wound irritating the skin below the wound area. He only renewed the final 3 days of cefdinir today 3/5; came in for a nurse change 3 days ago. Again a lot of drainage noted. Zinc oxide was applied unfortunately the drainage appears to a pool then he has a string of superficial open areas extending down into the Achilles area and some just below the wound. The wound itself does not look too bad. He is completed the antibiotics although apparently there was separation from the original 7 with the last 3 days. Culture grew a few MSSA and a few Pseudomonas 3/12; not too much difference over the last week. He came in for a dressing change on Monday  by our nursing staff. Necrotic debris again over the wound surface. The entire area looks irritated but nontender and I do not  think shows obvious evidence of infection. We have not heard anything about the reflux studies. Also notable than in this diabetic man we had noncompressible vessels and although I can feel his pulses easily in his feet I will order arterial studies as well. 3/19-Patient had experience more pain has been dressed with a silver  alginate, the wounds all have necrotic debris, very friable with easy bleeding with any kind of surface debridement including with gauze and Anasept and with a #3 curette. His vascular appointments unfortunately were all canceled on account of [name], [name] ([id number]) [id number].pdf Page 2 of 11 the virus outbreak resulting in studies only being done for emergent cases. His pulses are easily palpable in the lower extremity. He has open areas below the primary wound where he states the drainage which included purulent material also with some blood pooled and caused breakdown. 3/26; the patient was changed to Santyl with Hydrofera Blue backing last week. This was largely because the silver  alginate was sticking to the wound. He is still having quite a bit of pain. He tells us  that the reflux studies and arterial studies we had attempted to arrange through vein and vascular are not being booked until mid May. Culture that was done last week showed both Serratia moderate and a few methicillin sensitive staph aureus I started him on Bactrim  DS 1 p.o. twice daily on 3/23 for 7 days. He is here for follow-up. 4/3; patient is on Santyl with Hydrofera Blue. Patient complains of pain. Our intake nurse is noted drainage and some odor. He has completed Bactrim  recently for Serratia and a few methicillin sensitive staph aureus. After our staff push hard last week we were able to get venous studies as well as arterial studies. His arterial studies showed on the right great toe pressure of 0.86. On the left ABI at 1.47 TBI of 0.87. On both sides waveforms were  triphasic VENOUS STUDIES showed reflux in the femoral vein, popliteal vein great saphenous vein at the saphenofemoral junction and great saphenous vein at the proximal thigh. Also noted to have age-indeterminate superficial vein thrombosis involving the small saphenous vein. Notable that the patient is already on Xarelto  for atrial fibrillation. 4/9; the patient has been to see vascular surgery and reviewed by Dr. Melvenia. He was felt to have only minimal superficial venous reflux in the right great greater saphenous vein. For this reason he was not felt to be a candidate for laser ablation of the right greater saphenous vein. A wedge cushion was suggested to keep his leg elevated. He does have deep venous reflux on the right. Culture I did last time showed a combination of Serratia Pseudomonas and methicillin sensitive staph aureus. We have been using Hydrofera Blue to the large wound, Santyl to some of the deep punched out satellite lesions distal to it. There is some improvement 4/16; the application for Apligraf was put out for further review for material that we have resubmitted. He has the deep area on the right posterior calf which is large and then 3 small punched out areas beneath this. In general his wounds look a lot better 4/23; the patient has his large original wound and then 2 punched out satellite lesions below it on the right posterior calf. I was usually able to use Apligraf #1 to cover the full surface area of the larger wound. We  continued with Santyl and Hydrofera Blue on the 2 punched-out areas 5/7; patient has his large original wound and 2 punched out satellite lesions below it in the right posterior calf. Apligraf #2 today. Major improvement in the big wound in terms of wound depth. Punched-out Warren wounds have a very healthy looking wound bed. 5/21-Patient comes back for his large right posterior calf wound for which she has been an Apligraf #3 today. Wound depth seems to be  better, the punched-out wounds appear to have healthy bed with some bleeding 6/4; right posterior calf wound is much better. Apligraf #4 today. The major wound has no wound depth at all. Only 1 of the satellite lesions is still open. The patient has very high blood pressure coming in today with a diastolic blood pressure of 130 after lying there for a few minutes it was down to 110. He states that is running 100 210 diastolic at home. He was high last week as well I thought he was on 50 mg 1/2 tablet twice daily of losartan  I told him to double up on that however it turns out he is actually on 50 twice daily. Course he is run out of his medications prematurely. He has a follow-up with his primary's office next Wednesday. 6/18; patient's wounds continue to improve. The small area laterally is just about closed and there is continued epithelialization on the area on the posterior calf. 7/2; we applied his last Apligraf last visit. Early the next week there was a lot of purulent looking drainage that I cultured this grew staph and Pseudomonas however when we had him back for the next visit to 3 days later everything looked a lot better. He did not receive systemic antibiotics. Fortunately we did not have to remove the Apligraf. He has had heart trouble this week he was having an ablation apparently they have discovered a clot in his left atrium they have changed all his anticoagulants as well as his blood pressure medication. 7/16; using Hydrofera Blue. We are making nice progress towards closure. He has not yet ordered his compression stockings he has his measurements 7/23; dimensions are better. He has a new satellite area superiorly. Both wounds cauterized with silver  nitrate. 7/30; absolutely no change in the major wound on the posterior part of the right calf. We have been using Hydrofera Blue for a prolonged period of time and really had a nice improvement but over the last few weeks this is  stalled. There is no depth. He arrived in clinic today with a new area on the right anterior tibia which apparently was an ingrown hair. It is clearly an open area. This looks like a wrap injury although he was not really aware of it. 8/31; since the patient was last in clinic he was admitted to hospital from 8/17 through 8/22. He had had multiple falls at home including one off a ladder. He was admitted to hospital with mild COVID-19 viral pneumonia. Noted to be hyperkalemic and in acute renal failure. He has chronic systolic heart failure with ejection fraction of 35%. He arrives back in clinic with the original wound on the posterior aspect of the right lower calf looking a lot better. He has eschared areas from falls and lacerations on his anterior knees bilaterally just below the patella and on the mid part of his tibia bilaterally. He seems to have a small area on the right lateral ankle as well 9/10- Patient comes to clinic with a 2 new wounds  one on the left anterior shin and one on the right posterior leg both the left knee and right knee areas are healed up. We have been using Santyl to the left anterior leg and silver  alginate to right posterior leg wounds. 9/17; the patient is original wound looks good on the posterior right calf. He has traumatic areas on the left anterior shin left anterior patellar tendon and the right mid tibia area. Except for the right posterior calf all required debridement. We have been using Santyl on these areas and silver  alginate posteriorly right calf 9/24; the original wound on the posterior calf on the right looks good. This is healthy. There are traumatic wounds in the left anterior shin, left anterior patella in the right mid tibia area are about the same. He has dusky erythema on the left anterior tibia which led me to give him antibiotics last week. This does not look too much different. This is nontender and I wonder if this is all just venous  inflammation. 10/2; the patient has 2 open areas on the right posterior calf both of these look good we have been using silver  alginate. He has traumatic wounds on the left and right mid tibia areas. The surface of these wounds is still not ready for closure. We have been using Iodoflex. Finally has areas on the left knee. The latter wounds are all traumatic after a fall 10/9. His original wounds of the right posterior calf are superficial and progressing towards closure. His traumatic wounds on the left anterior tibia and right anterior tibia are considerably improved in terms of the wound surface. He asked me about a skin graft in these areas I do not think this is going to be necessary. Change from Iodosorb to Hydrofera Blue. The area on the left knee is just about closed as well 10/16; his original wound on the right posterior calf very superficial looks healthy. He has more recent traumatic wounds on the left anterior tibia and right anterior tibia both of these have better looking surfaces and measuring smaller. Might be beneficial for an advanced treatment product. The area on his left knee has a small superficial scab and we are going to close that when 10/23; his original wound areas on the right posterior calf continue to improve. The more recently traumatic wounds on the left anterior tibia and right anterior tibia both look better in terms of surfaces. No debridement required. We have been using Hydrofera Blue. He is approved for Apligraf 11/3; Apligraf #1 applied to the left anterior tibia right anterior tibia and to 2 small areas remaining on the right posterior calf which were part of his initial wound area. 11/19; Apligraf #2. Left anterior tibia is healed. Right anterior tibia much better. Only one small area remains on the right posterior calf which was part of his initial wound area 12/3; the left anterior tibia has opened up again. Hyper granulated nodule. Right anterior tibia is  superficial and larger. There is no depth of this. I did not place Apligraf today return to Hydrofera Blue under compression 12/10; the patient has 2 remaining wounds 1 on the right posterior and the other on the left anterior. Both of these look quite good. No debridement is required we have been using Hydrofera Blue under compression. 12/17; the patient has really not closed over which is disappointing. His wraps were very tight and there may be some wrap injury issues. On the left he has the original wound on the medial  mid tibia he has a wrap injury on the lateral mid tibia. On the right his posterior areas are superficial but certainly not closed 12/31; the patient has totally closed on the left. He will transition to his own 20/30 below-knee stocking on the left leg today. Interestingly on the right posterior calf the 2 wounds that he had from last time of closed however there is an additional injury in this same area. Almost looks as though there is subcutaneous fluid/blistering. There is no evidence of cellulitis this does not seem tender 06/24/2019; the patient comes in having been placed in his 20/30 stockings on the left leg last week. He states that he noted blisters break open on Saturday and [name], [name] ([id number]) [phone].pdf Page 3 of 11 they had opened into wounds on the anterior tibial area on the left by Monday. The area on the posterior right leg looks satisfactory we are still doing that and compression. We are using calcium  alginate to all wound areas 1/14; the patient had juxta light however one of them was not the right size which will need to be returned. The new area last week on the left anterior tibia looks a lot better it measures smaller I would say the area on the right is about the same, this is on the right posterior calf. We have been using silver  alginate 1/21; he has his bilateral juxta lites however the left anterior wound continues  to look better where is the 2 areas on the right posterior calf look about the same. We have been using Hydrofera Blue under compression with the juxta lite stockings now on standby 2/4; the areas on the left are totally healed and he will be discharged into his own stocking/juxta light. On the right posterior calf he has what looks to be a blister with a small area of subdermal hemorrhage this is very tiny but I did I am going to wrap him today and see if this will be reabsorbed by next week 2/11; he comes in this week with expanded wounds on the posterior calf as well as a new wound on the left anterior. He said the left anterior started a few days ago with a blister that is now surface. He is using his juxta lite stockings at a pressure of 30/40 2/19; we wrapped his left leg last week after a failed attempt to discharge that leg and his juxta lites 2 weeks ago. The leg is again healed he has superficial areas posteriorly on the right leg 2/26; the patient came in initially with right posterior calf wound secondary to minor trauma with secondary infection. He went on to develop a left lower leg wound. In the interim he had congestive heart failure with acute renal failure a fall with bilateral anterior leg wounds and finally Covid requiring admission to pneumonia with pneumonia. He is finally closed. He has external compression stockings bilaterally [juxta lite stockings]. His wounds are healed READMISSION 03/03/2023 This is a patient that we had here for a year from 20 20 through 20 21 with a sizable wound on the right posterior calf which was secondary to a combination of chronic venous insufficiency and secondary infection in the wound when he came here. This was a problematic wound in itself however he developed traumatic wounds on his lower extremities after a fall from a ladder during the same admission. Eventually he was healed out and discharged and juxta lite stockings. He tells me that  everything was fine up  until about 2-1/2 months ago he developed a blister and an open area on the left posterior calf this time. He saw his primary doctor and received a course of Bactrim . He has been using Silvadene  with AandE ointment and covering this with gauze. He is here for review of this area. When he was here in 2021 he also saw Dr. Medford Fireman of vein and vascular he had venous reflux studies that showed deep venous reflux but nothing that was amenable to surgery. He also had arterial studies. He was noncompressible on the left but had normal TBIs and triphasic waveforms. In terms of the reflux studies I am not really sure whether the left leg was involved in that study or not we will need to review that. His past medical history includes type type 2 diabetes, chronic venous insufficiency with secondary lymphedema, intermittent A-fib, nonischemic cardiomyopathy, thrombus of the left atrial appendage, hereditary hemochromatosis cytosis. He is now on Coumadin  presumably for the atrial fibrillation. ABIs in this clinic were not repeated 9/24; posterior left calf wound. We used Hydrofera Blue under compression. The wound looks better this week better looking surface. 10/1. Posterior left calf wound secondary to venous insufficiency. He was new to the clinic in 2 weeks ago at which time I thought he was going to require a difficult set of mechanical debridements however the wound surface is continuing to improve using Hydrofera Blue although the dimensions not so remarkably improved today 10/8; posterior left calf wound secondary to chronic venous insufficiency. He continues to think nice progress here on this wound the wound is smaller better looking surface. He arrived in clinic today with an abrasion on his Achilles area on the left leg as well as what looked to be a potential deep tissue injury on the lateral lateral part of the left foot at the level of the base of the fifth  metatarsal. 10/15 posterior left calf wound continues to improve. We have been using Prisma under ABDs under Urgo K2 lite's. The areas on the Achilles and the left lateral foot which were concerning last time of maintain skin integrity. He is not wearing stockings even on the right leg I talked to him about this. 10/22; posterior left calf wound measures slightly larger today. The abrasion injury in the Achilles send on the lateral fifth metatarsal base look improved. We have been using Prisma under compression I am going to change this to [facility] today 10/29; posterior left calf wound continues to progress nicely. Presumably an abrasion injury on the Achilles heel. I changed dressing to Hydrofera Blue. Will need to dress the area on the Achilles today. The area on the fifth metatarsal base is not open 11/5; posterior left calf wound continues to improve nicely presumably an abrasion injury on the left Achilles also looks clean. He has a what looks to be an abrasion on the base of the fifth metatarsal it is not open but it looks as though there is been some friction on this area we will pad this today under the wrap. Patient with chronic venous insufficiency severe hemosiderin deposition and resultant wounds. He has been in the clinic before however that was for a wound on the right posterior calf. Notable he is not wearing stockings even on the uninvolved right leg today 11/11; his original wound on the posterior left calf continues to improve in dimensions. Once again he comes in today with some black eschar around the edge I wonder if this is bleeding. He  has the area on the Achilles and now a new area on the left upper lower leg which he says he scratched. And finally he has an area on the right anterior mid tibia which he said he got from taking off his stocking 11/19; he has wounds on his bilateral lower legs. We have been wrapping with Urgo K2's. He has original wound on the left  posterior calf, probably a wrap injury on the left lateral calf, area on the left Achilles, and finally the right medial lower leg. We have been using Hydrofera Blue 12/3; came in this with.a wound on his left posterior leg. since then her has developed a more distal left wound and a right anterior. A wound of the left lateral has healed. We are using HFB with Urgo K2 12/10; the patient initially came in with a wound on his left posterior leg. This is healed today. He subsequently developed an area on the left Achilles heel and an area on the right anterior mid tibia area. We have been using Hydrofera Blue however intake reports that these are getting stuck to the wound. Also he has an area of painful erythema around the posterior left Achilles area. This could be rubbing in the wrap however I think coverage with antibiotics for cellulitis is certainly indicated we will be changing the primary dressing to polymen silver  12/17; his left posterior leg wound which was his original wound has remained closed. However he subsequently developed an area on the left Achilles heel and the right anterior mid tibia. Last week the area on the Achilles heel had surrounding erythema I gave him a course of doxycycline  and this seems to have resolved at least in terms of the erythema. He still however having discomfort in this wound. The area on the right anterior mid tibia looks really quite healthy. We have been polymen silver . 20/24; his original wound on the left posterior lower leg is closed he had a secondary wound just on the superior part of his Achilles tendon this looks better this week no erythema. His last wound is on the right anterior mid tibia. All of this looks quite good. We have been using polymen silver  under compression. 06/23/2023; patient presents for follow-up. The right lower extremity wound is healed today. He has compression stockings with him. He has reopened a wound to the left posterior leg.  He has 2 wounds on the left lower extremity. We have been using PolyMem silver  under compression therapy to the wound beds. Patient has no issues or complaints today. He denies signs of infection. 06/30/2023; patient presents for follow-up. He has 2 open wounds to the posterior left leg. We have been using PolyMem silver  under compression therapy. He has been wearing a compression stocking to the right lower extremity. He currently denies signs of infection. [name], [name] ([id number]) [id number] Page 4 of 11 Electronic Signature(s) Signed: 06/30/2023 3:07:10 PM By: [name] Raisin DO Entered By: [name] Raisin on 06/30/2023 13:42:20 -------------------------------------------------------------------------------- Physical Exam Details Patient Name: Date of Service: [name], [name] [name] J. 06/30/2023 1:30 PM Medical Record Number: [id number] Patient Account Number: [id number] Date of Birth/Sex: Treating RN: 09/21/1955 (68 y.o. M) Primary Care Provider: Rollene [name] Other Clinician: Referring Provider: Treating Provider/Extender: [name] Raisin [name] [name] in Treatment: 17 Constitutional respirations regular, non-labored and within target range for patient.. Cardiovascular 2+ dorsalis pedis/posterior tibialis pulses. Psychiatric pleasant and cooperative. Notes Left lower extremity: T the posterior calf there is an open wound limited to skin breakdown and  another wound with granulation tissue to the Achilles region. No o surrounding signs of infection to any of the wound beds. Decent edema control. Electronic Signature(s) Signed: 06/30/2023 3:07:10 PM By: [name] Raisin DO Entered By: [name] Raisin on 06/30/2023 13:43:40 -------------------------------------------------------------------------------- Physician Orders Details Patient Name: Date of Service: [name], [name] [name] J. 06/30/2023 1:30 PM Medical Record Number: [id number] Patient  Account Number: [id number] Date of Birth/Sex: Treating RN: 01/05/1956 (68 y.o. 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 - Dr.Natassja Ollis 07/07/2023 @ 1:30 (front office to schedule) Return A Return appointment in 3 weeks. - Dr .[name] (front office to schedule) [name], [name] ([id number]) [id number] Page 5 of 11 Nurse Visit: Anesthetic (In clinic) Topical Lidocaine  4% applied to wound bed Bathing/ Shower/ Hygiene May shower with protection but do not get wound dressing(s) wet. Protect dressing(s) with water repellant cover (for example, large plastic bag) or a cast cover and may then take shower. Edema Control - Orders / Instructions Elevate legs to the level of the heart or above for 30 minutes daily and/or when sitting for 3-4 times a day throughout the day. Avoid standing for long periods of time. Exercise regularly Wound Treatment Wound #13 - Lower Leg Wound Laterality: Left, Posterior Cleanser: Soap and Water 1 x Per Week/15 Days Discharge Instructions: May shower and wash wound with dial antibacterial soap and water prior to dressing change. Cleanser: Vashe 5.8 (oz) 1  x Per Week/15 Days Discharge Instructions: Cleanse the wound with Vashe prior to applying a clean dressing using gauze sponges, not tissue or cotton balls. Peri-Wound Care: Sween Lotion (Moisturizing lotion) 1 x Per Week/15 Days Discharge Instructions: Apply moisturizing lotion as directed Prim Dressing: PolyMem Silver  Non-Adhesive Dressing, 4.25x4.25 in 1 x Per Week/15 Days ary Discharge Instructions: Apply to wound bed as instructed Secondary Dressing: ABD Pad, 8x10 1 x Per Week/15 Days Discharge Instructions: Apply over primary dressing as directed. Compression Wrap: Urgo K2, (equivalent to a 4 layer) two layer compression system, regular 1 x Per Week/15 Days Discharge Instructions: Apply Urgo K2 as directed (alternative to 4 layer compression). Wound #14 - Achilles Wound Laterality: Left, Posterior Cleanser: Soap and Water 1 x Per Week/15 Days Discharge Instructions: May shower and wash wound with dial antibacterial soap and water prior to dressing change. Cleanser: Vashe 5.8 (oz) 1 x Per Week/15 Days Discharge Instructions: Cleanse the wound with Vashe prior to applying a clean dressing using gauze sponges, not tissue or cotton balls. Peri-Wound Care: Sween Lotion (Moisturizing lotion) 1 x Per Week/15 Days Discharge Instructions: Apply moisturizing lotion as directed Prim Dressing: PolyMem Silver  Non-Adhesive Dressing, 4.25x4.25 in 1 x Per Week/15 Days ary Discharge Instructions: Apply to wound bed as instructed Secondary Dressing: ABD Pad, 8x10 1 x Per Week/15 Days Discharge Instructions: Apply over primary dressing as directed. Compression Wrap: Urgo K2, (equivalent to a 4 layer) two layer  compression system, regular 1 x Per Week/15 Days Discharge Instructions: Apply Urgo K2 as directed (alternative to 4 layer compression). Electronic Signature(s) Signed: 06/30/2023 3:07:10 PM By: [name] Raisin DO Previous Signature: 06/30/2023 2:00:33 PM Version By: Claudene Blossom MSN RN CNS  WTA Entered By: [name] Raisin on 06/30/2023 14:42:56 -------------------------------------------------------------------------------- Problem List Details Patient Name: Date of Service: [name] OPPENHEIM [name] J. 06/30/2023 1:30 PM Medical Record Number: [id number] Patient Account Number: [id number] Date of Birth/Sex: Treating RN: 1956-05-08 (68 y.o. 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 04/14/2023 No Yes L97.818 Non-pressure chronic ulcer of other part of right lower leg with other specified 04/27/2023 No Yes severity E11.622 Type 2 diabetes mellitus with other skin ulcer 03/03/2023 No Yes L97.821 Non-pressure chronic ulcer of other part of left lower leg limited to breakdown 06/23/2023 No Yes of skin Inactive Problems ICD-10 Code Description Active Date Inactive Date L97.828 Non-pressure chronic ulcer of other part of left lower leg with other specified severity 03/03/2023 03/03/2023 L03.116 Cellulitis of left lower limb 05/26/2023 05/26/2023 Resolved Problems Electronic Signature(s) Signed: 06/30/2023 3:07:10 PM By: [name] Raisin DO Entered By: [name] Raisin on 06/30/2023 13:41:10 -------------------------------------------------------------------------------- Progress Note Details Patient Name: Date of Service: [name], [name] [name] J. 06/30/2023 1:30 PM Medical Record Number: [id number] Patient Account Number: [id number] Date of Birth/Sex: Treating RN: 04-04-1956 (68 y.o. M) Primary Care Provider:  Rollene [name] Other Clinician: Referring Provider: Treating Provider/Extender: [name] Raisin [name] [name] in Treatment: 17 Subjective Chief Complaint Information obtained from Patient 07/29/2018; patient is here for review of a fairly sizable wound on his right posterior calf 03/14/16/24; patient is here for review of a sizable wound on the left posterior calf History of Present Illness (HPI) ADMISSION 07/29/2018 Mr. Springfield is a 68 year old man with either prediabetes or diabetes he is on glipizide . In late October to November 2019 he noted a scabbed area on the back [name], [name] ([id number]) [phone].pdf Page 7 of 11 of his right calf. He picked this off a few times but it would not heal. He saw his primary physician on 12/5. It was felt at that time that this may actually heal on its own with conservative management. The next visit was on 07/20/2018 noted the area was a lot worse and arranged for his treatment here. He has been topical antibiotics like Neosporin although he stopped using this when the wound looked worse. He is just been covering this with a clean Band-Aid. He is given Bactrim  a week ago and he is finishing this currently. It is made some improvement in the surrounding erythema per the patient. He does not have a history of nonhealing wounds. No prior history of wounds on his legs that he had difficulty healing. No prior skin issues. 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 Past medical history; A. fib status post ablation and recent cardioversion he is on Xarelto . He has a history of systolic heart failure, cervical radiculopathy, cardiomyopathy and type 2 diabetes as discussed ABI in the right leg was noncompressible on the right 2/20; the biopsy I did on the patient last week was negative for malignancy. Culture grew Pseudomonas and methicillin sensitive  staph aureus. He is on cefdinir 300 twice a day. I will  have to make this a 10 day course. He put him in compression. He states that the redness pain and erythema are a lot better. I suspect the patient has chronic venous insufficiency probably with a secondary cellulitis 2/27; arrives today with copious amounts of drainage coming out of the wound irritating the skin below the wound area. He only renewed the final 3 days of cefdinir today 3/5; came in for a nurse change 3 days ago. Again a lot of drainage noted. Zinc oxide was applied unfortunately the drainage appears to a pool then he has a string of superficial open areas extending down into the Achilles area and some just below the wound. The wound itself does not look too bad. He is completed the antibiotics although apparently there was separation from the original 7 with the last 3 days. Culture grew a few MSSA and a few Pseudomonas 3/12; not too much difference over the last week. He came in for a dressing change on Monday by our nursing staff. Necrotic debris again over the wound surface. The entire area looks irritated but nontender and I do not think shows obvious evidence of infection. We have not heard anything about the reflux studies. Also notable than in this diabetic man we had noncompressible vessels and although I can feel his pulses easily in his feet I will order arterial studies as well. 3/19-Patient had experience more pain has been dressed with a silver  alginate, the wounds all have necrotic debris, very friable with easy bleeding with any kind of surface debridement including with gauze and Anasept and with a #3 curette. His vascular appointments unfortunately were all canceled on account of the virus outbreak resulting in studies only being done for emergent cases. His pulses are easily palpable in the lower extremity. He has open areas below the primary wound where he states the drainage which included purulent material also with some blood pooled and caused breakdown. 3/26; the  patient was changed to Santyl with Hydrofera Blue backing last week. This was largely because the silver  alginate was sticking to the wound. He is still having quite a bit of pain. He tells us  that the reflux studies and arterial studies we had attempted to arrange through vein and vascular are not being booked until mid May. Culture that was done last week showed both Serratia moderate and a few methicillin sensitive staph aureus I started him on Bactrim  DS 1 p.o. twice daily on 3/23 for 7 days. He is here for follow-up. 4/3; patient is on Santyl with Hydrofera Blue. Patient complains of pain. Our intake nurse is noted drainage and some odor. He has completed Bactrim  recently for Serratia and a few methicillin sensitive staph aureus. After our staff push hard last week we were able to get venous studies as well as arterial studies. His arterial studies showed on the right great toe pressure of 0.86. On the left ABI at 1.47 TBI of 0.87. On both sides waveforms were triphasic VENOUS STUDIES showed reflux in the femoral vein, popliteal vein great saphenous vein at the saphenofemoral junction and great saphenous vein at the proximal thigh. Also noted to have age-indeterminate superficial vein thrombosis involving the small saphenous vein. Notable that the patient is already on Xarelto  for atrial fibrillation. 4/9; the patient has been to see vascular surgery and reviewed by Dr. Melvenia. He was felt to have only minimal superficial venous reflux in the right great  greater saphenous vein. For this reason he was not felt to be a candidate for laser ablation of the right greater saphenous vein. A wedge cushion was suggested to keep his leg elevated. He does have deep venous reflux on the right. Culture I did last time showed a combination of Serratia Pseudomonas and methicillin sensitive staph aureus. We have been using Hydrofera Blue to the large wound, Santyl to some of the deep punched out satellite lesions  distal to it. There is some improvement 4/16; the application for Apligraf was put out for further review for material that we have resubmitted. He has the deep area on the right posterior calf which is large and then 3 small punched out areas beneath this. In general his wounds look a lot better 4/23; the patient has his large original wound and then 2 punched out satellite lesions below it on the right posterior calf. I was usually able to use Apligraf #1 to cover the full surface area of the larger wound. We continued with Santyl and Hydrofera Blue on the 2 punched-out areas 5/7; patient has his large original wound and 2 punched out satellite lesions below it in the right posterior calf. Apligraf #2 today. Major improvement in the big wound in terms of wound depth. Punched-out [place] wounds have a very healthy looking wound bed. 5/21-Patient comes back for his large right posterior calf wound for which she has been an Apligraf #3 today. Wound depth seems to be better, the punched-out wounds appear to have healthy bed with some bleeding 6/4; right posterior calf wound is much better. Apligraf #4 today. The major wound has no wound depth at all. Only 1 of the satellite lesions is still open. The patient has very high blood pressure coming in today with a diastolic blood pressure of 130 after lying there for a few minutes it was down to 110. He states that is running 100 210 diastolic at home. He was high last week as well I thought he was on 50 mg 1/2 tablet twice daily of losartan  I told him to double up on that however it turns out he is actually on 50 twice daily. Course he is run out of his medications prematurely. He has a follow-up with his primary's office next Wednesday. 6/18; patient's wounds continue to improve. The small area laterally is just about closed and there is continued epithelialization on the area on the posterior calf. 7/2; we applied his last Apligraf last visit. Early the  next week there was a lot of purulent looking drainage that I cultured this grew staph and Pseudomonas however when we had him back for the next visit to 3 days later everything looked a lot better. He did not receive systemic antibiotics. Fortunately we did not have to remove the Apligraf. He has had heart trouble this week he was having an ablation apparently they have discovered a clot in his left atrium they have changed all his anticoagulants as well as his blood pressure medication. 7/16; using Hydrofera Blue. We are making nice progress towards closure. He has not yet ordered his compression stockings he has his measurements 7/23; dimensions are better. He has a new satellite area superiorly. Both wounds cauterized with silver  nitrate. 7/30; absolutely no change in the major wound on the posterior part of the right calf. We have been using Hydrofera Blue for a prolonged period of time and really had a nice improvement but over the last few weeks this is stalled. There is  no depth. He arrived in clinic today with a new area on the right anterior tibia which apparently was an ingrown hair. It is clearly an open area. This looks like a wrap injury although he was not really aware of it. 8/31; since the patient was last in clinic he was admitted to hospital from 8/17 through 8/22. He had had multiple falls at home including one off a ladder. He was admitted to hospital with mild COVID-19 viral pneumonia. Noted to be hyperkalemic and in acute renal failure. He has chronic systolic heart failure with ejection fraction of 35%. He arrives back in clinic with the original wound on the posterior aspect of the right lower calf looking a lot better. He has eschared areas from falls and lacerations on his anterior knees bilaterally just below the patella and on the mid part of his tibia bilaterally. He seems to have a small area on the right lateral ankle as well 9/10- Patient comes to clinic with a 2  new wounds one on the left anterior shin and one on the right posterior leg both the left knee and right knee areas are healed up. We have been using Santyl to the left anterior leg and silver  alginate to right posterior leg wounds. 9/17; the patient is original wound looks good on the posterior right calf. He has traumatic areas on the left anterior shin left anterior patellar tendon and the right mid tibia area. Except for the right posterior calf all required debridement. We have been using Santyl on these areas and silver  alginate posteriorly right calf 9/24; the original wound on the posterior calf on the right looks good. This is healthy. There are traumatic wounds in the left anterior shin, left anterior patella in the right mid tibia area are about the same. He has dusky erythema on the left anterior tibia which led me to give him antibiotics last week. This does not look too much different. This is nontender and I wonder if this is all just venous inflammation. 10/2; the patient has 2 open areas on the right posterior calf both of these look good we have been using silver  alginate. He has traumatic wounds on the left [place] ([id number]) [id number] Page 8 of 11 and right mid tibia areas. The surface of these wounds is still not ready for closure. We have been using Iodoflex. Finally has areas on the left knee. The latter wounds are all traumatic after a fall 10/9. His original wounds of the right posterior calf are superficial and progressing towards closure. His traumatic wounds on the left anterior tibia and right anterior tibia are considerably improved in terms of the wound surface. He asked me about a skin graft in these areas I do not think this is going to be necessary. Change from Iodosorb to Hydrofera Blue. The area on the left knee is just about closed as well 10/16; his original wound on the right posterior calf very superficial looks healthy.  He has more recent traumatic wounds on the left anterior tibia and right anterior tibia both of these have better looking surfaces and measuring smaller. Might be beneficial for an advanced treatment product. The area on his left knee has a small superficial scab and we are going to close that when 10/23; his original wound areas on the right posterior calf continue to improve. The more recently traumatic wounds on the left anterior tibia and right anterior tibia both look better in terms of surfaces. No debridement required.  We have been using Hydrofera Blue. He is approved for Apligraf 11/3; Apligraf #1 applied to the left anterior tibia right anterior tibia and to 2 small areas remaining on the right posterior calf which were part of his initial wound area. 11/19; Apligraf #2. Left anterior tibia is healed. Right anterior tibia much better. Only one small area remains on the right posterior calf which was part of his initial wound area 12/3; the left anterior tibia has opened up again. Hyper granulated nodule. Right anterior tibia is superficial and larger. There is no depth of this. I did not place Apligraf today return to Hydrofera Blue under compression 12/10; the patient has 2 remaining wounds 1 on the right posterior and the other on the left anterior. Both of these look quite good. No debridement is required we have been using Hydrofera Blue under compression. 12/17; the patient has really not closed over which is disappointing. His wraps were very tight and there may be some wrap injury issues. On the left he has the original wound on the medial mid tibia he has a wrap injury on the lateral mid tibia. On the right his posterior areas are superficial but certainly not closed 12/31; the patient has totally closed on the left. He will transition to his own 20/30 below-knee stocking on the left leg today. Interestingly on the right posterior calf the 2 wounds that he had from last time of  closed however there is an additional injury in this same area. Almost looks as though there is subcutaneous fluid/blistering. There is no evidence of cellulitis this does not seem tender 06/24/2019; the patient comes in having been placed in his 20/30 stockings on the left leg last week. He states that he noted blisters break open on Saturday and they had opened into wounds on the anterior tibial area on the left by Monday. The area on the posterior right leg looks satisfactory we are still doing that and compression. We are using calcium  alginate to all wound areas 1/14; the patient had juxta light however one of them was not the right size which will need to be returned. The new area last week on the left anterior tibia looks a lot better it measures smaller I would say the area on the right is about the same, this is on the right posterior calf. We have been using silver  alginate 1/21; he has his bilateral juxta lites however the left anterior wound continues to look better where is the 2 areas on the right posterior calf look about the same. We have been using Hydrofera Blue under compression with the juxta lite stockings now on standby 2/4; the areas on the left are totally healed and he will be discharged into his own stocking/juxta light. On the right posterior calf he has what looks to be a blister with a small area of subdermal hemorrhage this is very tiny but I did I am going to wrap him today and see if this will be reabsorbed by next week 2/11; he comes in this week with expanded wounds on the posterior calf as well as a new wound on the left anterior. He said the left anterior started a few days ago with a blister that is now surface. He is using his juxta lite stockings at a pressure of 30/40 2/19; we wrapped his left leg last week after a failed attempt to discharge that leg and his juxta lites 2 weeks ago. The leg is again healed he has superficial  areas posteriorly on the right  leg 2/26; the patient came in initially with right posterior calf wound secondary to minor trauma with secondary infection. He went on to develop a left lower leg wound. In the interim he had congestive heart failure with acute renal failure a fall with bilateral anterior leg wounds and finally Covid requiring admission to pneumonia with pneumonia. He is finally closed. He has external compression stockings bilaterally [juxta lite stockings]. His wounds are healed READMISSION 03/03/2023 This is a patient that we had here for a year from 20 20 through 20 21 with a sizable wound on the right posterior calf which was secondary to a combination of chronic venous insufficiency and secondary infection in the wound when he came here. This was a problematic wound in itself however he developed traumatic wounds on his lower extremities after a fall from a ladder during the same admission. Eventually he was healed out and discharged and juxta lite stockings. He tells me that everything was fine up until about 2-1/2 months ago he developed a blister and an open area on the left posterior calf this time. He saw his primary doctor and received a course of Bactrim . He has been using Silvadene  with AandE ointment and covering this with gauze. He is here for review of this area. When he was here in 2021 he also saw Dr. [place] [facility] of vein and vascular he had venous reflux studies that showed deep venous reflux but nothing that was amenable to surgery. He also had arterial studies. He was noncompressible on the left but had normal TBIs and triphasic waveforms. In terms of the reflux studies I am not really sure whether the left leg was involved in that study or not we will need to review that. His past medical history includes type type 2 diabetes, chronic venous insufficiency with secondary lymphedema, intermittent A-fib, nonischemic cardiomyopathy, thrombus of the left atrial appendage, hereditary hemochromatosis  cytosis. He is now on Coumadin  presumably for the atrial fibrillation. ABIs in this clinic were not repeated 9/24; posterior left calf wound. We used Hydrofera Blue under compression. The wound looks better this week better looking surface. 10/1. Posterior left calf wound secondary to venous insufficiency. He was new to the clinic in 2 weeks ago at which time I thought he was going to require a difficult set of mechanical debridements however the wound surface is continuing to improve using Hydrofera Blue although the dimensions not so remarkably improved today 10/8; posterior left calf wound secondary to chronic venous insufficiency. He continues to think nice progress here on this wound the wound is smaller better looking surface. He arrived in clinic today with an abrasion on his Achilles area on the left leg as well as what looked to be a potential deep tissue injury on the lateral lateral part of the left foot at the level of the base of the fifth metatarsal. 10/15 posterior left calf wound continues to improve. We have been using Prisma under ABDs under Urgo K2 lite's. The areas on the Achilles and the left lateral foot which were concerning last time of maintain skin integrity. He is not wearing stockings even on the right leg I talked to him about this. 10/22; posterior left calf wound measures slightly larger today. The abrasion injury in the Achilles send on the lateral fifth metatarsal base look improved. We have been using Prisma under compression I am going to change this to [facility] today 10/29; posterior left calf wound continues to  progress nicely. Presumably an abrasion injury on the Achilles heel. I changed dressing to Hydrofera Blue. Will need to dress the area on the Achilles today. The area on the fifth metatarsal base is not open 11/5; posterior left calf wound continues to improve nicely presumably an abrasion injury on the left Achilles also looks clean. He has a what  looks to be an abrasion on the base of the fifth metatarsal it is not open but it looks as though there is been some friction on this area we will pad this today under the wrap. Patient with chronic venous insufficiency severe hemosiderin deposition and resultant wounds. He has been in the clinic before however that was for a wound on the right posterior calf. Notable he is not wearing stockings even on the uninvolved right leg today 11/11; his original wound on the posterior left calf continues to improve in dimensions. Once again he comes in today with some black eschar around the edge I wonder if this is bleeding. He has the area on the Achilles and now a new area on the left upper lower leg which he says he scratched. And finally he has an area on the right anterior mid tibia which he said he got from taking off his stocking [name], [name] ([id number]) [id number] Page 9 of 11 11/19; he has wounds on his bilateral lower legs. We have been wrapping with Urgo K2's. He has original wound on the left posterior calf, probably a wrap injury on the left lateral calf, area on the left Achilles, and finally the right medial lower leg. We have been using Hydrofera Blue 12/3; came in this with.a wound on his left posterior leg. since then her has developed a more distal left wound and a right anterior. A wound of the left lateral has healed. We are using HFB with Urgo K2 12/10; the patient initially came in with a wound on his left posterior leg. This is healed today. He subsequently developed an area on the left Achilles heel and an area on the right anterior mid tibia area. We have been using Hydrofera Blue however intake reports that these are getting stuck to the wound. Also he has an area of painful erythema around the posterior left Achilles area. This could be rubbing in the wrap however I think coverage with antibiotics for cellulitis is certainly indicated we will be  changing the primary dressing to polymen silver  12/17; his left posterior leg wound which was his original wound has remained closed. However he subsequently developed an area on the left Achilles heel and the right anterior mid tibia. Last week the area on the Achilles heel had surrounding erythema I gave him a course of doxycycline  and this seems to have resolved at least in terms of the erythema. He still however having discomfort in this wound. The area on the right anterior mid tibia looks really quite healthy. We have been polymen silver . 20/24; his original wound on the left posterior lower leg is closed he had a secondary wound just on the superior part of his Achilles tendon this looks better this week no erythema. His last wound is on the right anterior mid tibia. All of this looks quite good. We have been using polymen silver  under compression. 06/23/2023; patient presents for follow-up. The right lower extremity wound is healed today. He has compression stockings with him. He has reopened a wound to the left posterior leg. He has 2 wounds on the left lower extremity. We  have been using PolyMem silver  under compression therapy to the wound beds. Patient has no issues or complaints today. He denies signs of infection. 06/30/2023; patient presents for follow-up. He has 2 open wounds to the posterior left leg. We have been using PolyMem silver  under compression therapy. He has been wearing a compression stocking to the right lower extremity. He currently denies signs of infection. Objective Constitutional respirations regular, non-labored and within target range for patient.. Vitals Time Taken: 1:09 PM, Height: 73 in, Weight: 254 lbs, BMI: 33.5, Temperature: 98.0 F, Pulse: 70 bpm, Respiratory Rate: 16 breaths/min, Blood Pressure: 124/81 mmHg. Cardiovascular 2+ dorsalis pedis/posterior tibialis pulses. Psychiatric pleasant and cooperative. General Notes: Left lower extremity: T the posterior  calf there is an open wound limited to skin breakdown and another wound with granulation tissue to the o Achilles region. No surrounding signs of infection to any of the wound beds. Decent edema control. Integumentary (Hair, Skin) Wound #13 status is Open. Original cause of wound was Blister. The date acquired was: 01/20/2023. The wound has been in treatment 17 weeks. The wound is located on the Left,Posterior Lower Leg. The wound measures 1.2cm length x 2.3cm width x 0.1cm depth; 2.168cm^2 area and 0.217cm^3 volume. There is Fat Layer (Subcutaneous Tissue) exposed. There is a none present amount of drainage noted. The wound margin is distinct with the outline attached to the wound base. There is medium (34-66%) granulation within the wound bed. There is a medium (34-66%) amount of necrotic tissue within the wound bed. The periwound skin appearance had no abnormalities noted for moisture. The periwound skin appearance exhibited: Scarring, Hemosiderin Staining. Periwound temperature was noted as No Abnormality. Wound #14 status is Open. Original cause of wound was Blister. The date acquired was: 04/07/2023. The wound has been in treatment 11 weeks. The wound is located on the Left,Posterior Achilles. The wound measures 1.5cm length x 1cm width x 0.1cm depth; 1.178cm^2 area and 0.118cm^3 volume. There is Fat Layer (Subcutaneous Tissue) exposed. There is a medium amount of serosanguineous drainage noted. The wound margin is distinct with the outline attached to the wound base. There is large (67-100%) red granulation within the wound bed. There is a small (1-33%) amount of necrotic tissue within the wound bed including Adherent Slough. The periwound skin appearance had no abnormalities noted for moisture. The periwound skin appearance exhibited: Scarring, Hemosiderin Staining. Periwound temperature was noted as No Abnormality. Assessment Active Problems ICD-10 Chronic venous hypertension (idiopathic)  with ulcer and inflammation of left lower extremity Non-pressure chronic ulcer of left ankle with other specified severity Non-pressure chronic ulcer of other part of right lower leg with other specified severity Type 2 diabetes mellitus with other skin ulcer Non-pressure chronic ulcer of other part of left lower leg limited to breakdown of skin Patient's wounds appear well-healing. I recommended continuing the course with PolyMem silver  Under compression therapy. Patient is going to be assigned community service. He asked for a note limiting his activity. I agree that he should not be standing for more than 1 hour at a time. We gave him a note in office today. [name], [name] ([id number]) [id number] Page 10 of 11 Procedures Wound #13 Pre-procedure diagnosis of Wound #13 is a Venous Leg Ulcer located on the Left,Posterior Lower Leg . There was a Four Layer Compression Therapy Procedure by Claudene Blossom, RN. Post procedure Diagnosis Wound #13: Same as Pre-Procedure Wound #14 Pre-procedure diagnosis of Wound #14 is a Diabetic Wound/Ulcer of the Lower Extremity located on the  Left,Posterior Achilles . There was a Four Layer Compression Therapy Procedure by Claudene Blossom, RN. Post procedure Diagnosis Wound #14: Same as Pre-Procedure Plan Follow-up Appointments: Return Appointment in 2 weeks. - Dr.Abbygail [place] 07/07/2023 @ 1:30 (front office to schedule) Return appointment in 3 weeks. - Dr .[name] (front office to schedule) Nurse Visit: Anesthetic: (In clinic) Topical Lidocaine  4% applied to wound bed Bathing/ Shower/ Hygiene: May shower with protection but do not get wound dressing(s) wet. Protect dressing(s) with water repellant cover (for example, large plastic bag) or a cast cover and may then take shower. Edema Control - Orders / Instructions: Elevate legs to the level of the heart or above for 30 minutes daily and/or when sitting for 3-4 times a day throughout the  day. Avoid standing for long periods of time. Exercise regularly WOUND #13: - Lower Leg Wound Laterality: Left, Posterior Cleanser: Soap and Water 1 x Per Week/15 Days Discharge Instructions: May shower and wash wound with dial antibacterial soap and water prior to dressing change. Cleanser: Vashe 5.8 (oz) 1 x Per Week/15 Days Discharge Instructions: Cleanse the wound with Vashe prior to applying a clean dressing using gauze sponges, not tissue or cotton balls. Peri-Wound Care: Sween Lotion (Moisturizing lotion) 1 x Per Week/15 Days Discharge Instructions: Apply moisturizing lotion as directed Prim Dressing: PolyMem Silver  Non-Adhesive Dressing, 4.25x4.25 in 1 x Per Week/15 Days ary Discharge Instructions: Apply to wound bed as instructed Secondary Dressing: ABD Pad, 8x10 1 x Per Week/15 Days Discharge Instructions: Apply over primary dressing as directed. Com pression Wrap: Urgo K2, (equivalent to a 4 layer) two layer compression system, regular 1 x Per Week/15 Days Discharge Instructions: Apply Urgo K2 as directed (alternative to 4 layer compression). WOUND #14: - Achilles Wound Laterality: Left, Posterior Cleanser: Soap and Water 1 x Per Week/15 Days Discharge Instructions: May shower and wash wound with dial antibacterial soap and water prior to dressing change. Cleanser: Vashe 5.8 (oz) 1 x Per Week/15 Days Discharge Instructions: Cleanse the wound with Vashe prior to applying a clean dressing using gauze sponges, not tissue or cotton balls. Peri-Wound Care: Sween Lotion (Moisturizing lotion) 1 x Per Week/15 Days Discharge Instructions: Apply moisturizing lotion as directed Prim Dressing: PolyMem Silver  Non-Adhesive Dressing, 4.25x4.25 in 1 x Per Week/15 Days ary Discharge Instructions: Apply to wound bed as instructed Secondary Dressing: ABD Pad, 8x10 1 x Per Week/15 Days Discharge Instructions: Apply over primary dressing as directed. Com pression Wrap: Urgo K2, (equivalent to a 4  layer) two layer compression system, regular 1 x Per Week/15 Days Discharge Instructions: Apply Urgo K2 as directed (alternative to 4 layer compression). 1. PolyMem silver  under compression therapy to the left lower extremity 2. Compression stocking daily to the right lower extremity 3. Follow-up in 1 week Electronic Signature(s) Signed: 06/30/2023 3:07:10 PM By: [name] Raisin DO Entered By: [name] Raisin on 06/30/2023 14:43:07 -------------------------------------------------------------------------------- SuperBill Details Patient Name: Date of Service: [name], [name] [name] J. 06/30/2023 Medical Record Number: [id number] Patient Account Number: [id number] [name], [name] ([id number]) [id number] Page 11 of 11 Date of Birth/Sex: Treating RN: 22-Dec-1955 (68 y.o. M) Primary Care Provider: Rollene [name] Other Clinician: Referring Provider: Treating Provider/Extender: [name] Raisin [name] [name] in Treatment: 17 Diagnosis Coding ICD-10 Codes Code Description [phone] Chronic venous hypertension (idiopathic) with ulcer and inflammation of left lower extremity L97.328 Non-pressure chronic ulcer of left ankle with other specified severity L97.818 Non-pressure chronic ulcer of other part of right lower leg with other specified severity E11.622 Type 2 diabetes mellitus with  other skin ulcer L97.821 Non-pressure chronic ulcer of other part of left lower leg limited to breakdown of skin Facility Procedures : CPT4 Code: [id number] Description: (Facility Use Only) [phone] - APPLY MULTLAY COMPRS LWR LT LEG Modifier: Quantity: 1 Physician Procedures : CPT4 Code Description Modifier [id number] - WC PHYS LEVEL 3 - EST PT ICD-10 Diagnosis Description I87.332 Chronic venous hypertension (idiopathic) with ulcer and inflammation of left lower extremity L97.821 Non-pressure chronic ulcer of other part  of left lower leg limited to breakdown of skin  E11.622 Type 2 diabetes mellitus with other skin ulcer Quantity: 1 Electronic Signature(s) Signed: 06/30/2023 2:04:58 PM By: Claudene Blossom MSN RN CNS WTA Signed: 06/30/2023 3:07:10 PM By: [name] Raisin DO Entered By: Claudene Blossom on 06/30/2023 14:04:57

## 2023-07-01 NOTE — Progress Notes (Signed)
 [name], [name] ([id number]) [id number] Page 1 of 11 Visit Report for 06/23/2023 Arrival Information Details Patient Name: Date of Service: [name], [name] 06/23/2023 1:00 PM Medical Record Number: [id number] Patient Account Number: [id number] Date of Birth/Sex: Treating RN: [birth year] (68 y.o. M) Primary Care [name]: [name] Other Clinician: Referring [name]: Treating [name]: [name] [name] in Treatment: 16 Visit Information History Since Last Visit Added or deleted any medications: No Patient Arrived: Ambulatory Any new allergies or adverse reactions: No Arrival Time: 12:53 Had a fall or experienced change in No Accompanied By: self activities of daily living that may affect Transfer Assistance: Manual risk of falls: Patient Identification Verified: Yes Signs or symptoms of abuse/neglect since last visito No Secondary Verification Process Completed: Yes Hospitalized since last visit: No Patient Requires Transmission-Based Precautions: No Implantable device outside of the clinic excluding No Patient Has Alerts: Yes cellular tissue based products placed in the center Patient Alerts: Patient on Blood Thinner since last visit: Has Dressing in Place as Prescribed: Yes Has Compression in Place as Prescribed: Yes Pain Present Now: No Electronic Signature(s) Signed: 06/29/2023 5:23:50 PM By: [name] Entered By: [name] on 06/23/2023 12:53:28 -------------------------------------------------------------------------------- Clinic Level of Care Assessment Details Patient Name: Date of Service: [name], [name] 06/23/2023 1:00 PM Medical Record Number: [id number] Patient Account Number: [id number] Date of Birth/Sex: Treating RN: 02-27-1956 (69 y.o. M) Primary Care Azarah Dacy: [name] Other Clinician: Referring Jevan Gaunt: Treating Riker Collier/Extender: [name] [name] in Treatment: 16 Clinic Level of Care Assessment Items TOOL 1 Quantity Score []  - 0 Use when EandM and Procedure is performed on INITIAL visit ASSESSMENTS - Nursing Assessment / Reassessment []  - 0 General Physical Exam (combine w/ comprehensive assessment (listed just below) when performed on new pt. evals) []  - 0 Comprehensive Assessment (HX, ROS, Risk Assessments, Wounds Hx, etc.) ASSESSMENTS - Wound and Skin Assessment / Reassessment []  - 0 Dermatologic / Skin Assessment (not related to wound area) ASSESSMENTS - Ostomy and/or Continence Assessment and Care []  - 0 Incontinence Assessment and Management []  - 0 Ostomy Care Assessment and Management (repouching, etc.) PROCESS - Coordination of Care []  - 0 Simple Patient / Family Education for ongoing care [place], [name] ([id number]) [id number] Page 2 of 11 []  - 0 Complex (extensive) Patient / Family Education for ongoing care []  - 0 Staff obtains Chiropractor, Records, T Results / Process Orders est []  - 0 Staff telephones HHA, Nursing Homes / Clarify orders / etc []  - 0 Routine Transfer to another Facility (non-emergent condition) []  - 0 Routine Hospital Admission (non-emergent condition) []  - 0 New Admissions / Manufacturing Engineer / Ordering NPWT Apligraf, etc. , []  - 0 Emergency Hospital Admission (emergent condition) PROCESS - Special Needs []  - 0 Pediatric / Minor Patient Management []  - 0 Isolation Patient Management []  - 0 Hearing / Language / Visual special needs []  - 0 Assessment of Community assistance (transportation, D/C planning, etc.) []  - 0 Additional assistance / Altered mentation []  - 0 Support Surface(s) Assessment (bed, cushion, seat, etc.) INTERVENTIONS - Miscellaneous []  - 0 External ear exam []  - 0 Patient Transfer (multiple staff / Nurse, Adult / Similar devices) []  - 0 Simple Staple / Suture removal (25 or less) []  - 0 Complex Staple /  Suture removal (26 or more) []  - 0 Hypo/Hyperglycemic Management (do not check if billed separately) []  - 0 Ankle / Brachial Index (ABI) - do not check if billed separately Has the patient been  seen at the hospital within the last three years: Yes Total Score: 0 Level Of Care: ____ Electronic Signature(s) Signed: 06/29/2023 5:23:50 PM By: [name] Entered By: [name] on 06/23/2023 13:26:26 -------------------------------------------------------------------------------- Compression Therapy Details Patient Name: Date of Service: [name] OPPENHEIM NDY J. 06/23/2023 1:00 PM Medical Record Number: [id number] Patient Account Number: [id number] Date of Birth/Sex: Treating RN: [birth year] (68 y.o. M) Primary Care Tikesha Mort: [name] Other Clinician: Referring Aysha Livecchi: Treating Tommy Goostree/Extender: [name] [name] in Treatment: 16 Compression Therapy Performed for Wound Assessment: Wound #14 Left,Posterior Achilles Performed By: Clinician , Compression Type: Four Layer Post Procedure Diagnosis Same as Pre-procedure Electronic Signature(s) Signed: 06/29/2023 5:23:50 PM By: [name] Entered By: [name] on 06/23/2023 13:25:28 [name] ([id number]) [id number] Page 3 of 11 -------------------------------------------------------------------------------- Compression Therapy Details Patient Name: Date of Service: [name], [name] 06/23/2023 1:00 PM Medical Record Number: [id number] Patient Account Number: [id number] Date of Birth/Sex: Treating RN: 08-01-1955 (68 y.o. M) Primary Care Draden Cottingham: [name] Other Clinician: Referring Celvin Taney: Treating Sayge Brienza/Extender: [name] [name] in Treatment: 16 Compression Therapy Performed for Wound Assessment: Wound #13 Left,Posterior Lower Leg Performed By: Clinician , Compression Type: Four Layer Post Procedure Diagnosis Same as  Pre-procedure Electronic Signature(s) Signed: 06/29/2023 5:23:50 PM By: [name] Entered By: [name] on 06/23/2023 13:25:28 -------------------------------------------------------------------------------- Encounter Discharge Information Details Patient Name: Date of Service: [name] OPPENHEIM NDY J. 06/23/2023 1:00 PM Medical Record Number: [id number] Patient Account Number: [id number] Date of Birth/Sex: Treating RN: [birth year] (68 y.o. M) Zochol, Jamie Primary Care Jerrie Schussler: [name] Other Clinician: Referring Dossie Swor: Treating Eriverto Byrnes/Extender: [name] [name] in Treatment: 16 Encounter Discharge Information Items Discharge Condition: Stable Ambulatory Status: Ambulatory Discharge Destination: Home Transportation: Private Auto Accompanied By: self Schedule Follow-up Appointment: Yes Clinical Summary of Care: Electronic Signature(s) Signed: 06/23/2023 2:56:47 PM By: Zochol, Jamie RN Entered By: Zochol, Jamie on 06/23/2023 14:56:47 -------------------------------------------------------------------------------- Lower Extremity Assessment Details Patient Name: Date of Service: [name], [name] NDY J. 06/23/2023 1:00 PM Medical Record Number: [id number] Patient Account Number: [id number] Date of Birth/Sex: Treating RN: Nov 09, 1955 (68 y.o. M) Primary Care Onalee Steinbach: [name] Other Clinician: Referring Jullien Granquist: Treating Demesha Boorman/Extender: [name] [name] in Treatment: 16 Edema Assessment Left: [Left: Right] [Right: :] Assessed: [Left: No] [Right: No] Edema: [Left: No] [Right: No] Calf Left: Right: Point of Measurement: 33 cm From Medial Instep 36 cm 37 cm Ankle Left: Right: Point of Measurement: 12 cm From Medial Instep 23 cm 21.5 cm Vascular Assessment Extremity colors, hair growth, and conditions: Extremity Color: [Left:Hyperpigmented] [Right:Hyperpigmented] Hair Growth on Extremity: [Left:No]  [Right:No] Temperature of Extremity: [Left:Warm] [Right:Warm] Capillary Refill: [Left:< 3 seconds] [Right:< 3 seconds] Dependent Rubor: [Left:No Yes] [Right:No No] Electronic Signature(s) Signed: 06/29/2023 5:23:50 PM By: [name] Entered By: [name] on 06/23/2023 12:59:14 -------------------------------------------------------------------------------- Multi Wound Chart Details Patient Name: Date of Service: [name] OPPENHEIM NDY J. 06/23/2023 1:00 PM Medical Record Number: [id number] Patient Account Number: [id number] Date of Birth/Sex: Treating RN: 10/06/1955 (68 y.o. M) Primary Care Harmoni Lucus: [name] Other Clinician: Referring Shalene Gallen: Treating Maurianna Benard/Extender: [name] [name] in Treatment: 16 Vital Signs Height(in): 73 Pulse(bpm): 68 Weight(lbs): 254 Blood Pressure(mmHg): 111/67 Body Mass Index(BMI): 33.5 Temperature(F): 97.9 Respiratory Rate(breaths/min): 16 [13:Photos:] Left, Posterior Lower Leg Left, Posterior Achilles Right, Anterior Lower Leg Wound Location: Blister Blister Trauma Wounding Event: Venous Leg Ulcer Diabetic Wound/Ulcer of the Lower Abrasion Primary Etiology: Extremity Diabetic Wound/Ulcer of the Lower N/A Diabetic Wound/Ulcer of the Lower Secondary Etiology: Extremity Extremity  Asthma, Sleep Apnea, Arrhythmia, Asthma, Sleep Apnea, Arrhythmia, Asthma, Sleep Apnea, Arrhythmia, Comorbid History: Congestive Heart Failure, Congestive Heart Failure, Congestive Heart Failure, Hypertension, Type II Diabetes Hypertension, Type II Diabetes Hypertension, Type II Diabetes 01/20/2023 04/07/2023 02/25/2023 Date Acquired: 16 10 8  Weeks of Treatment: Open Open Healed - Epithelialized Wound Status: No No No Wound Recurrence: [name], [name] ([id number]) [phone].pdf Page 5 of 11 1.8x1.5x0.1 1.5x2.7x0.1 0x0x0 Measurements L x W x D (cm) 2.121 3.181 0 A (cm) : rea 0.212 0.318 0 Volume (cm)  : 89.10% -1523.00% 100.00% % Reduction in Area: 94.50% -1490.00% 100.00% % Reduction in Volume: Full Thickness Without Exposed Grade 1 Full Thickness Without Exposed Classification: Support Structures Support Structures None Present Medium Medium Exudate Amount: N/A Serosanguineous Serosanguineous Exudate Type: N/A red, brown red, brown Exudate Color: Distinct, outline attached Distinct, outline attached Distinct, outline attached Wound Margin: Medium (34-66%) Large (67-100%) Large (67-100%) Granulation Amount: N/A Red Red Granulation Quality: Medium (34-66%) Small (1-33%) None Present (0%) Necrotic Amount: Fat Layer (Subcutaneous Tissue): Yes Fat Layer (Subcutaneous Tissue): Yes Fat Layer (Subcutaneous Tissue): Yes Exposed Structures: Fascia: No Fascia: No Fascia: No Tendon: No Tendon: No Tendon: No Muscle: No Muscle: No Muscle: No Joint: No Joint: No Joint: No Bone: No Bone: No Bone: No Small (1-33%) None Medium (34-66%) Epithelialization: Scarring: Yes Scarring: Yes Excoriation: No Periwound Skin Texture: Induration: No Callus: No Crepitus: No Rash: No Scarring: No No Abnormalities Noted No Abnormalities Noted Maceration: No Periwound Skin Moisture: Dry/Scaly: No Hemosiderin Staining: Yes Hemosiderin Staining: Yes Hemosiderin Staining: Yes Periwound Skin Color: Atrophie Blanche: No Cyanosis: No Ecchymosis: No Erythema: No Mottled: No Pallor: No Rubor: No No Abnormality No Abnormality No Abnormality Temperature: Compression Therapy Compression Therapy N/A Procedures Performed: Treatment Notes Electronic Signature(s) Signed: 06/24/2023 5:26:35 PM By: [name] Raisin DO Entered By: [name] Raisin on 06/23/2023 13:37:51 -------------------------------------------------------------------------------- Multi-Disciplinary Care Plan Details Patient Name: Date of Service: [name] OPPENHEIM NDY J. 06/23/2023 1:00 PM Medical Record Number:  [id number] Patient Account Number: [id number] Date of Birth/Sex: Treating RN: 1955-09-08 (68 y.o. M) Primary Care Ginger Leeth: [name] Other Clinician: Referring Amazing Cowman: Treating Vale Mousseau/Extender: [name] Raisin [name] [name] in Treatment: 16 Active Inactive Wound/Skin Impairment Nursing Diagnoses: Impaired tissue integrity Knowledge deficit related to ulceration/compromised skin integrity Goals: Patient/caregiver will verbalize understanding of skin care regimen Date Initiated: 03/03/2023 Target Resolution Date: 07/17/2023 Goal Status: Active Ulcer/skin breakdown will have a volume reduction of 30% by week 4 Date Initiated: 03/03/2023 Date Inactivated: 05/05/2023 Target Resolution Date: 04/18/2023 Unmet Reason: see wound [name], [name] ([id number]) [phone].pdf Page 6 of 11 Unmet Reason: see wound Goal Status: Unmet measurements. Interventions: Assess patient/caregiver ability to obtain necessary supplies Assess patient/caregiver ability to perform ulcer/skin care regimen upon admission and as needed Assess ulceration(s) every visit Provide education on ulcer and skin care Notes: Electronic Signature(s) Signed: 06/29/2023 5:23:50 PM By: [name] Entered By: [name] on 06/23/2023 13:25:51 -------------------------------------------------------------------------------- Pain Assessment Details Patient Name: Date of Service: [name] OPPENHEIM NDY J. 06/23/2023 1:00 PM Medical Record Number: [id number] Patient Account Number: [id number] Date of Birth/Sex: Treating RN: 14-Oct-1955 (68 y.o. M) Primary Care Elayjah Chaney: [name] Other Clinician: Referring Marytza Grandpre: Treating Westly Hinnant/Extender: [name] Raisin [name] [name] in Treatment: 16 Active Problems Location of Pain Severity and Description of Pain Patient Has Paino Yes Site Locations Pain Location: Pain in Ulcers With Dressing Change: No Pain  Management and Medication Current Pain Management: How does your wound impact your activities of daily livingo Sleep: Yes Electronic Signature(s) Signed: 06/29/2023 5:23:50 PM  By: [name] Entered By: [name] on 06/23/2023 12:53:51 [name] ([id number]) [id number] Page 7 of 11 -------------------------------------------------------------------------------- Patient/Caregiver Education Details Patient Name: Date of Service: [name], [name] 1/7/2025andnbsp1:00 PM Medical Record Number: [id number] Patient Account Number: [id number] Date of Birth/Gender: Treating RN: 05-09-1956 (68 y.o. M) Primary Care Physician: [name] Other Clinician: Referring Physician: Treating Physician/Extender: [name] [name] in Treatment: 16 Education Assessment Education Provided To: Patient Education Topics Provided Wound/Skin Impairment: Methods: Explain/Verbal Responses: Reinforcements needed, State content correctly Electronic Signature(s) Signed: 06/29/2023 5:23:50 PM By: [name] Entered By: [name] on 06/23/2023 13:26:11 -------------------------------------------------------------------------------- Wound Assessment Details Patient Name: Date of Service: [name] FLORINA NDY J. 06/23/2023 1:00 PM Medical Record Number: [id number] Patient Account Number: [id number] Date of Birth/Sex: Treating RN: 1956-03-12 (68 y.o. M) Primary Care Everleigh Colclasure: [name] Other Clinician: Referring Prue Lingenfelter: Treating Debroh Sieloff/Extender: [name] [name] in Treatment: 16 Wound Status Wound Number: 13 Primary Venous Leg Ulcer Etiology: Wound Location: Left, Posterior Lower Leg Secondary Diabetic Wound/Ulcer of the Lower Extremity Wounding Event: Blister Etiology: Date Acquired: 01/20/2023 Wound Open Weeks Of Treatment: 16 Status: Clustered Wound: No Comorbid Asthma, Sleep Apnea,  Arrhythmia, Congestive Heart Failure, History: Hypertension, Type II Diabetes Photos Wound Measurements Length: (cm) 1.8 Width: (cm) 1.5 Depth: (cm) 0.1 Area: (cm) 2.121 Volume: (cm) 0.212 [place], Jasan J ([id number]) Wound Description Classification: Full Thickness Without Exposed Support Structures Wound Margin: Distinct, outline attached Exudate Amount: None Present Foul Odor After Cleansing: No Slough/Fibrino No % Reduction in Area: 89.1% % Reduction in Volume: 94.5% Epithelialization: Small (1-33%) Tunneling: No Undermining: No [id number] Page 8 of 11 Wound Bed Granulation Amount: Medium (34-66%) Exposed Structure Necrotic Amount: Medium (34-66%) Fascia Exposed: No Fat Layer (Subcutaneous Tissue) Exposed: Yes Tendon Exposed: No Muscle Exposed: No Joint Exposed: No Bone Exposed: No Periwound Skin Texture Texture Color No Abnormalities Noted: No No Abnormalities Noted: No Scarring: Yes Hemosiderin Staining: Yes Moisture Temperature / Pain No Abnormalities Noted: Yes Temperature: No Abnormality Electronic Signature(s) Signed: 06/23/2023 4:32:03 PM By: Zochol, Jamie RN Entered By: Zochol, Jamie on 06/23/2023 13:06:35 -------------------------------------------------------------------------------- Wound Assessment Details Patient Name: Date of Service: [name], [name] NDY J. 06/23/2023 1:00 PM Medical Record Number: [id number] Patient Account Number: [id number] Date of Birth/Sex: Treating RN: November 15, 1955 (68 y.o. M) Primary Care Aliyanna Wassmer: [name] Other Clinician: Referring Shelisha Gautier: Treating Mitsue Peery/Extender: [name] [name] in Treatment: 16 Wound Status Wound Number: 14 Primary Diabetic Wound/Ulcer of the Lower Extremity Etiology: Wound Location: Left, Posterior Achilles Wound Open Wounding Event: Blister Status: Date Acquired: 04/07/2023 Comorbid Asthma, Sleep Apnea, Arrhythmia, Congestive Heart  Failure, Weeks Of Treatment: 10 History: Hypertension, Type II Diabetes Clustered Wound: No Photos Wound Measurements Length: (cm) 1.5 Width: (cm) 2.7 Depth: (cm) 0.1 Area: (cm) 3.181 Volume: (cm) 0.318 % Reduction in Area: -1523% % Reduction in Volume: -1490% Epithelialization: None Tunneling: No Undermining: No Wound Description [name], FRISBEE ([id number]) Classification: Grade 1 Wound Margin: Distinct, outline attached Exudate Amount: Medium Exudate Type: Serosanguineous Exudate Color: red, brown [id number] Page 9 of 11 Foul Odor After Cleansing: No Slough/Fibrino Yes Wound Bed Granulation Amount: Large (67-100%) Exposed Structure Granulation Quality: Red Fascia Exposed: No Necrotic Amount: Small (1-33%) Fat Layer (Subcutaneous Tissue) Exposed: Yes Necrotic Quality: Adherent Slough Tendon Exposed: No Muscle Exposed: No Joint Exposed: No Bone Exposed: No Periwound Skin Texture Texture Color No Abnormalities Noted: No No Abnormalities Noted: No Scarring: Yes Hemosiderin Staining: Yes Moisture Temperature / Pain No Abnormalities Noted: Yes Temperature: No Abnormality Electronic Signature(s)  Signed: 06/23/2023 4:32:03 PM By: Zochol, Jamie RN Entered By: Zochol, Jamie on 06/23/2023 13:07:10 -------------------------------------------------------------------------------- Wound Assessment Details Patient Name: Date of Service: LAVOY, BERNARDS NDY J. 06/23/2023 1:00 PM Medical Record Number: [id number] Patient Account Number: [id number] Date of Birth/Sex: Treating RN: [birth year] (68 y.o. M) Primary Care Ivania Teagarden: [name] Other Clinician: Referring Raffaella Edison: Treating Zyia Kaneko/Extender: [name] [name] in Treatment: 16 Wound Status Wound Number: 15 Primary Abrasion Etiology: Wound Location: Right, Anterior Lower Leg Secondary Diabetic Wound/Ulcer of the Lower Extremity Wounding Event:  Trauma Etiology: Date Acquired: 02/25/2023 Wound Healed - Epithelialized Weeks Of Treatment: 8 Status: Clustered Wound: No Comorbid Asthma, Sleep Apnea, Arrhythmia, Congestive Heart Failure, History: Hypertension, Type II Diabetes Photos Wound Measurements Length: (cm) Width: (cm) Depth: (cm) Area: (cm) Volume: (cm) ODEN, LINDAMAN ([id number]) Wound Description Classification: Full Thickness Without Exposed Suppor Wound Margin: Distinct, outline attached Exudate Amount: Medium Exudate Type: Serosanguineous Exudate Color: red, brown t Structures Foul Odor After Cleansing: Slough/Fibrino 0 % Reduction in Area: 100% 0 % Reduction in Volume: 100% 0 Epithelialization: Medium (34-66%) 0 0 [id number] Page 10 of 11 No No Wound Bed Granulation Amount: Large (67-100%) Exposed Structure Granulation Quality: Red Fascia Exposed: No Necrotic Amount: None Present (0%) Fat Layer (Subcutaneous Tissue) Exposed: Yes Tendon Exposed: No Muscle Exposed: No Joint Exposed: No Bone Exposed: No Periwound Skin Texture Texture Color No Abnormalities Noted: Yes No Abnormalities Noted: No Atrophie Blanche: No Moisture Cyanosis: No No Abnormalities Noted: Yes Ecchymosis: No Erythema: No Hemosiderin Staining: Yes Mottled: No Pallor: No Rubor: No Temperature / Pain Temperature: No Abnormality Treatment Notes Wound #15 (Lower Leg) Wound Laterality: Right, Anterior Cleanser Peri-Wound Care Topical Primary Dressing Secondary Dressing Secured With Compression Wrap Compression Stockings Add-Ons Electronic Signature(s) Signed: 06/29/2023 5:23:50 PM By: [name] Entered By: [name] on 06/23/2023 13:25:04 -------------------------------------------------------------------------------- Vitals Details Patient Name: Date of Service: [name], [name] NDY J. 06/23/2023 1:00 PM Medical Record Number: [id number] Patient Account Number: [id number] Date of  Birth/Sex: Treating RN: 02/06/1956 (68 y.o. M) Primary Care Jeral Zick: [name] Other Clinician: Referring Kailin Principato: Treating Valorie Mcgrory/Extender: [name] [name] in Treatment: 16 Vital Signs Time Taken: 13:00 Temperature (F): 97.9 Height (in): 73 Pulse (bpm): [address] Torey J ([id number]) [id number] Page 11 of 11 Weight (lbs): 254 Respiratory Rate (breaths/min): 16 Body Mass Index (BMI): 33.5 Blood Pressure (mmHg): 111/67 Reference Range: 80 - 120 mg / dl Electronic Signature(s) Signed: 06/29/2023 5:23:50 PM By: [name] Entered By: [name] on 06/23/2023 13:14:11

## 2023-07-02 NOTE — Progress Notes (Signed)
 [name] ([id number]) [id number] Page 1 of 11 Visit Report for 06/30/2023 Arrival Information Details Patient Name: Date of Service: [name], [name] 06/30/2023 1:30 PM Medical Record Number: [id number] Patient Account Number: [id number] Date of Birth/Sex: Treating RN: [birth year] (68 y.o. NETTY Claudene Blossom Primary Care Rey Dansby: Rollene Norris Other Clinician: Referring Andric Kerce: Treating Norvin Ohlin/Extender: Rosan Harlene Rollene Norris Devra in Treatment: 17 Visit Information History Since Last Visit Added or deleted any medications: No Patient Arrived: Ambulatory Any new allergies or adverse reactions: No Arrival Time: 13:06 Had a fall or experienced change in No Accompanied By: self activities of daily living that may affect Transfer Assistance: None risk of falls: Patient Identification Verified: Yes Signs or symptoms of abuse/neglect since last visito No Secondary Verification Process Completed: Yes Hospitalized since last visit: No Patient Requires Transmission-Based Precautions: No Pain Present Now: No Patient Has Alerts: Yes Patient Alerts: Patient on Blood Thinner Electronic Signature(s) Signed: 07/01/2023 4:44:23 PM By: Claudene Blossom MSN RN CNS WTA Entered By: Claudene Blossom on 06/30/2023 13:08:28 -------------------------------------------------------------------------------- Clinic Level of Care Assessment Details Patient Name: Date of Service: Victor, [name] NDY J. 06/30/2023 1:30 PM Medical Record Number: [id number] Patient Account Number: [id number] Date of Birth/Sex: Treating RN: 02/04/1956 (68 y.o. NETTY Claudene Blossom Primary Care Dontre Laduca: Rollene Norris Other Clinician: Referring Aizah Gehlhausen: Treating Topher Buenaventura/Extender: Rosan Harlene Rollene Norris Devra in Treatment: 17 Clinic Level of Care Assessment Items TOOL 4 Quantity Score []  - 0 Use when only an EandM is performed on FOLLOW-UP visit ASSESSMENTS -  Nursing Assessment / Reassessment []  - 0 Reassessment of Co-morbidities (includes updates in patient status) []  - 0 Reassessment of Adherence to Treatment Plan ASSESSMENTS - Wound and Skin A ssessment / Reassessment []  - 0 Simple Wound Assessment / Reassessment - one wound []  - 0 Complex Wound Assessment / Reassessment - multiple wounds []  - 0 Dermatologic / Skin Assessment (not related to wound area) ASSESSMENTS - Focused Assessment []  - 0 Circumferential Edema Measurements - multi extremities []  - 0 Nutritional Assessment / Counseling / Intervention []  - 0 Lower Extremity Assessment (monofilament, tuning fork, pulses) []  - 0 Peripheral Arterial Disease Assessment (using hand held dopplerCAREL, CARRIER ([id number]) [id number] Page 2 of 11 ASSESSMENTS - Ostomy and/or Continence Assessment and Care []  - 0 Incontinence Assessment and Management []  - 0 Ostomy Care Assessment and Management (repouching, etc.) PROCESS - Coordination of Care []  - 0 Simple Patient / Family Education for ongoing care []  - 0 Complex (extensive) Patient / Family Education for ongoing care []  - 0 Staff obtains Chiropractor, Records, T Results / Process Orders est []  - 0 Staff telephones HHA, Nursing Homes / Clarify orders / etc []  - 0 Routine Transfer to another Facility (non-emergent condition) []  - 0 Routine Hospital Admission (non-emergent condition) []  - 0 New Admissions / Manufacturing Engineer / Ordering NPWT Apligraf, etc. , []  - 0 Emergency Hospital Admission (emergent condition) []  - 0 Simple Discharge Coordination []  - 0 Complex (extensive) Discharge Coordination PROCESS - Special Needs []  - 0 Pediatric / Minor Patient Management []  - 0 Isolation Patient Management []  - 0 Hearing / Language / Visual special needs []  - 0 Assessment of Community assistance (transportation, D/C planning, etc.) []  - 0 Additional assistance / Altered mentation []  -  0 Support Surface(s) Assessment (bed, cushion, seat, etc.) INTERVENTIONS - Wound Cleansing / Measurement []  - 0 Simple Wound Cleansing - one wound []  - 0 Complex Wound Cleansing - multiple wounds []  - 0 Wound Imaging (photographs -  any number of wounds) []  - 0 Wound Tracing (instead of photographs) []  - 0 Simple Wound Measurement - one wound []  - 0 Complex Wound Measurement - multiple wounds INTERVENTIONS - Wound Dressings []  - 0 Small Wound Dressing one or multiple wounds []  - 0 Medium Wound Dressing one or multiple wounds []  - 0 Large Wound Dressing one or multiple wounds []  - 0 Application of Medications - topical []  - 0 Application of Medications - injection INTERVENTIONS - Miscellaneous []  - 0 External ear exam []  - 0 Specimen Collection (cultures, biopsies, blood, body fluids, etc.) []  - 0 Specimen(s) / Culture(s) sent or taken to Lab for analysis []  - 0 Patient Transfer (multiple staff / Nurse, Adult / Similar devices) []  - 0 Simple Staple / Suture removal (25 or less) []  - 0 Complex Staple / Suture removal (26 or more) []  - 0 Hypo / Hyperglycemic Management (close monitor of Blood Glucose) []  - 0 Ankle / Brachial Index (ABI) - do not check if billed separately []  - 0 Vital Signs HENRY, UTSEY ([id number]) [id number] Page 3 of 11 Has the patient been seen at the hospital within the last three years: Yes Total Score: 0 Level Of Care: ____ Electronic Signature(s) Signed: 07/01/2023 4:44:23 PM By: Claudene Blossom MSN RN CNS WTA Entered By: Claudene Blossom on 06/30/2023 14:04:38 -------------------------------------------------------------------------------- Compression Therapy Details Patient Name: Date of Service: [name] NDY J. 06/30/2023 1:30 PM Medical Record Number: [id number] Patient Account Number: [id number] Date of Birth/Sex: Treating RN: 17-Jan-1956 (68 y.o. NETTY Claudene Blossom Primary Care Keeon Zurn: Rollene Norris Other  Clinician: Referring Elvin Mccartin: Treating Margit Batte/Extender: Rosan Harlene Rollene Norris Devra in Treatment: 17 Compression Therapy Performed for Wound Assessment: Wound #13 Left,Posterior Lower Leg Performed By: Clinician Claudene Blossom, RN Compression Type: Four Layer Post Procedure Diagnosis Same as Pre-procedure Electronic Signature(s) Signed: 06/30/2023 1:57:49 PM By: Claudene Blossom MSN RN CNS WTA Entered By: Claudene Blossom on 06/30/2023 13:57:49 -------------------------------------------------------------------------------- Compression Therapy Details Patient Name: Date of Service: [name] NDY J. 06/30/2023 1:30 PM Medical Record Number: [id number] Patient Account Number: [id number] Date of Birth/Sex: Treating RN: August 11, 1955 (68 y.o. NETTY Claudene Blossom Primary Care Aldair Rickel: Rollene Norris Other Clinician: Referring Keionte Swicegood: Treating Raeya Merritts/Extender: Rosan Harlene Rollene Norris Devra in Treatment: 17 Compression Therapy Performed for Wound Assessment: Wound #14 Left,Posterior Achilles Performed By: Clinician Claudene Blossom, RN Compression Type: Four Layer Post Procedure Diagnosis Same as Pre-procedure Electronic Signature(s) Signed: 06/30/2023 1:57:50 PM By: Claudene Blossom MSN RN CNS WTA Entered By: Claudene Blossom on 06/30/2023 13:57:49 -------------------------------------------------------------------------------- Encounter Discharge Information Details Patient Name: Date of Service: [name], [name] NDY J. 06/30/2023 1:30 PM Medical Record Number: [id number] Patient Account Number: [id number] OISIN, YOAKUM ([id number]) [id number] Page 4 of 11 Date of Birth/Sex: Treating RN: 02/24/1956 (68 y.o. NETTY Claudene Blossom Primary Care Yared Barefoot: Other Clinician: Rollene Norris Referring Van Ehlert: Treating Myrlene Riera/Extender: Rosan Harlene Rollene Norris Devra in Treatment: 17 Encounter Discharge Information Items Discharge  Condition: Stable Ambulatory Status: Ambulatory Discharge Destination: Home Transportation: Private Auto Accompanied By: self Schedule Follow-up Appointment: Yes Clinical Summary of Care: Electronic Signature(s) Signed: 06/30/2023 2:06:14 PM By: Claudene Blossom MSN RN CNS WTA Entered By: Claudene Blossom on 06/30/2023 14:06:14 -------------------------------------------------------------------------------- Lower Extremity Assessment Details Patient Name: Date of Service: [name] NDY J. 06/30/2023 1:30 PM Medical Record Number: [id number] Patient Account Number: [id number] Date of Birth/Sex: Treating RN: 1955/10/30 (68 y.o. NETTY Claudene Blossom Primary Care Kura Bethards: Rollene Norris Other Clinician: Referring Jamirah Zelaya: Treating Kendi Defalco/Extender: Rosan Harlene Rollene Norris Devra in Treatment: 17 Edema  Assessment Assessed: [Left: No] [Right: No] Edema: [Left: No] [Right: No] Calf Left: Right: Point of Measurement: 33 cm From Medial Instep 36 cm 37 cm Ankle Left: Right: Point of Measurement: 12 cm From Medial Instep 23 cm 21.5 cm Vascular Assessment Pulses: Dorsalis Pedis Palpable: [Left:Yes] Extremity colors, hair growth, and conditions: Extremity Color: [Left:Hyperpigmented] [Right:Hyperpigmented] Hair Growth on Extremity: [Left:No] [Right:No] Temperature of Extremity: [Left:Warm] [Right:Warm] Capillary Refill: [Left:< 3 seconds] [Right:< 3 seconds] Dependent Rubor: [Left:No Yes] [Right:No No] Electronic Signature(s) Signed: 06/30/2023 1:57:29 PM By: Claudene Blossom MSN RN CNS WTA Entered By: Claudene Blossom on 06/30/2023 13:57:29 [name] DARINA PARAS ([id number]) [id number] Page 5 of 11 -------------------------------------------------------------------------------- Multi Wound Chart Details Patient Name: Date of Service: DREDEN, RIVERE 06/30/2023 1:30 PM Medical Record Number: [id number] Patient Account Number: [id number] Date of Birth/Sex:  Treating RN: 1956-03-08 (68 y.o. M) Primary Care Lydia Toren: Rollene Norris Other Clinician: Referring Javaris Wigington: Treating Eesha Schmaltz/Extender: Rosan Harlene Rollene Norris Devra in Treatment: 17 Vital Signs Height(in): 73 Pulse(bpm): 70 Weight(lbs): 254 Blood Pressure(mmHg): 124/81 Body Mass Index(BMI): 33.5 Temperature(F): 98.0 Respiratory Rate(breaths/min): 16 [13:Photos:] [N/A:N/A] Left, Posterior Lower Leg Left, Posterior Achilles N/A Wound Location: Blister Blister N/A Wounding Event: Venous Leg Ulcer Diabetic Wound/Ulcer of the Lower N/A Primary Etiology: Extremity Diabetic Wound/Ulcer of the Lower N/A N/A Secondary Etiology: Extremity Asthma, Sleep Apnea, Arrhythmia, Asthma, Sleep Apnea, Arrhythmia, N/A Comorbid History: Congestive Heart Failure, Congestive Heart Failure, Hypertension, Type II Diabetes Hypertension, Type II Diabetes 01/20/2023 04/07/2023 N/A Date Acquired: 17 11 N/A Weeks of Treatment: Open Open N/A Wound Status: No No N/A Wound Recurrence: 1.2x2.3x0.1 1.5x1x0.1 N/A Measurements L x W x D (cm) 2.168 1.178 N/A A (cm) : rea 0.217 0.118 N/A Volume (cm) : 88.80% -501.00% N/A % Reduction in Area: 94.40% -490.00% N/A % Reduction in Volume: Full Thickness Without Exposed Grade 1 N/A Classification: Support Structures None Present Medium N/A Exudate Amount: N/A Serosanguineous N/A Exudate Type: N/A red, brown N/A Exudate Color: Distinct, outline attached Distinct, outline attached N/A Wound Margin: Medium (34-66%) Large (67-100%) N/A Granulation Amount: N/A Red N/A Granulation Quality: Medium (34-66%) Small (1-33%) N/A Necrotic Amount: Fat Layer (Subcutaneous Tissue): Yes Fat Layer (Subcutaneous Tissue): Yes N/A Exposed Structures: Fascia: No Fascia: No Tendon: No Tendon: No Muscle: No Muscle: No Joint: No Joint: No Bone: No Bone: No Small (1-33%) None N/A Epithelialization: Scarring: Yes Scarring: Yes  N/A Periwound Skin Texture: No Abnormalities Noted No Abnormalities Noted N/A Periwound Skin Moisture: Hemosiderin Staining: Yes Hemosiderin Staining: Yes N/A Periwound Skin Color: No Abnormality No Abnormality N/A Temperature: Treatment Notes Electronic Signature(s) Signed: 06/30/2023 3:07:10 PM By: Rosan Harlene DO Entered By: Rosan Harlene on 06/30/2023 13:41:15 [name] DARINA PARAS ([id number]) [id number] Page 6 of 11 -------------------------------------------------------------------------------- Multi-Disciplinary Care Plan Details Patient Name: Date of Service: DEARL, RUDDEN 06/30/2023 1:30 PM Medical Record Number: [id number] Patient Account Number: [id number] Date of Birth/Sex: Treating RN: 1955-11-28 (68 y.o. NETTY Claudene Blossom Primary Care Dale Ribeiro: Rollene Norris Other Clinician: Referring [place] Current: Treating Marisabel Macpherson/Extender: Rosan Harlene Rollene Norris Devra in Treatment: 17 Active Inactive Wound/Skin Impairment Nursing Diagnoses: Impaired tissue integrity Knowledge deficit related to ulceration/compromised skin integrity Goals: Patient/caregiver will verbalize understanding of skin care regimen Date Initiated: 03/03/2023 Target Resolution Date: 07/17/2023 Goal Status: Active Ulcer/skin breakdown will have a volume reduction of 30% by week 4 Date Initiated: 03/03/2023 Date Inactivated: 05/05/2023 Target Resolution Date: 04/18/2023 Unmet Reason: see wound Goal Status: Unmet measurements. Interventions: Assess patient/caregiver ability to obtain necessary supplies Assess patient/caregiver ability to perform ulcer/skin care regimen  upon admission and as needed Assess ulceration(s) every visit Provide education on ulcer and skin care Notes: Electronic Signature(s) Signed: 06/30/2023 2:00:39 PM By: Claudene Blossom MSN RN CNS WTA Entered By: Claudene Blossom on 06/30/2023  14:00:38 -------------------------------------------------------------------------------- Pain Assessment Details Patient Name: Date of Service: [name] NDY J. 06/30/2023 1:30 PM Medical Record Number: [id number] Patient Account Number: [id number] Date of Birth/Sex: Treating RN: January 12, 1956 (68 y.o. NETTY Claudene Blossom Primary Care Juel Ripley: Rollene Norris Other Clinician: Referring Lawsen Arnott: Treating Coline Calkin/Extender: Rosan Harlene Rollene Norris Devra in Treatment: 17 Active Problems Location of Pain Severity and Description of Pain Patient Has Paino Yes Site Locations Rate the pain. MALONE, VANBLARCOM ([id number]) [id number] Page 7 of 11 Rate the pain. Current Pain Level: 4 Pain Management and Medication Current Pain Management: Electronic Signature(s) Signed: 07/01/2023 4:44:23 PM By: Claudene Blossom MSN RN CNS WTA Entered By: Claudene Blossom on 06/30/2023 13:11:41 -------------------------------------------------------------------------------- Patient/Caregiver Education Details Patient Name: Date of Service: [name] LESLEE DOROTHA 1/14/2025andnbsp1:30 PM Medical Record Number: [id number] Patient Account Number: [id number] Date of Birth/Gender: Treating RN: 1955-08-06 (68 y.o. NETTY Claudene Blossom Primary Care Physician: Rollene Norris Other Clinician: Referring Physician: Treating Physician/Extender: Rosan Harlene Rollene Norris Devra in Treatment: 17 Education Assessment Education Provided To: Patient Education Topics Provided Wound/Skin Impairment: Handouts: Caring for Your Ulcer Methods: Explain/Verbal Responses: State content correctly Electronic Signature(s) Signed: 07/01/2023 4:44:23 PM By: Claudene Blossom MSN RN CNS WTA Entered By: Claudene Blossom on 06/30/2023 14:03:52 -------------------------------------------------------------------------------- Wound Assessment Details Patient Name: Date of Service: [name] NDY J.  06/30/2023 1:30 PM Medical Record Number: [id number] Patient Account Number: [id number] Date of Birth/Sex: Treating RN: 08/17/1955 (68 y.o. NETTY Claudene Blossom Primary Care Orlyn Odonoghue: Rollene Norris Other Clinician: [name] DARINA PARAS ([id number]) [id number] Page 8 of 11 Referring Kilani Joffe: Treating Manuel Lawhead/Extender: Rosan Harlene Rollene Norris Devra in Treatment: 17 Wound Status Wound Number: 13 Primary Venous Leg Ulcer Etiology: Wound Location: Left, Posterior Lower Leg Secondary Diabetic Wound/Ulcer of the Lower Extremity Wounding Event: Blister Etiology: Date Acquired: 01/20/2023 Wound Open Weeks Of Treatment: 17 Status: Clustered Wound: No Comorbid Asthma, Sleep Apnea, Arrhythmia, Congestive Heart Failure, History: Hypertension, Type II Diabetes Photos Wound Measurements Length: (cm) 1.2 Width: (cm) 2.3 Depth: (cm) 0.1 Area: (cm) 2.168 Volume: (cm) 0.217 % Reduction in Area: 88.8% % Reduction in Volume: 94.4% Epithelialization: Small (1-33%) Wound Description Classification: Full Thickness Without Exposed Support Structures Wound Margin: Distinct, outline attached Exudate Amount: None Present Foul Odor After Cleansing: No Slough/Fibrino No Wound Bed Granulation Amount: Medium (34-66%) Exposed Structure Necrotic Amount: Medium (34-66%) Fascia Exposed: No Fat Layer (Subcutaneous Tissue) Exposed: Yes Tendon Exposed: No Muscle Exposed: No Joint Exposed: No Bone Exposed: No Periwound Skin Texture Texture Color No Abnormalities Noted: No No Abnormalities Noted: No Scarring: Yes Hemosiderin Staining: Yes Moisture Temperature / Pain No Abnormalities Noted: Yes Temperature: No Abnormality Treatment Notes Wound #13 (Lower Leg) Wound Laterality: Left, Posterior Cleanser Soap and Water Discharge Instruction: May shower and wash wound with dial antibacterial soap and water prior to dressing change. Vashe 5.8 (oz) Discharge  Instruction: Cleanse the wound with Vashe prior to applying a clean dressing using gauze sponges, not tissue or cotton balls. Peri-Wound Care Sween Lotion (Moisturizing lotion) Discharge Instruction: Apply moisturizing lotion as directed Topical Primary Dressing PolyMem Silver  Non-Adhesive Dressing, 4.25x4.25 in [place] ([id number]) [phone] Page 9 of 11 Discharge Instruction: Apply to wound bed as instructed Secondary Dressing ABD Pad, 8x10 Discharge Instruction: Apply over primary dressing as directed. Secured With Compression Wrap Urgo K2, (equivalent to  a 4 layer) two layer compression system, regular Discharge Instruction: Apply Urgo K2 as directed (alternative to 4 layer compression). Compression Stockings Add-Ons Electronic Signature(s) Signed: 07/01/2023 4:44:23 PM By: Claudene Blossom MSN RN CNS WTA Entered By: Claudene Blossom on 06/30/2023 13:22:20 -------------------------------------------------------------------------------- Wound Assessment Details Patient Name: Date of Service: [name] NDY J. 06/30/2023 1:30 PM Medical Record Number: [id number] Patient Account Number: [id number] Date of Birth/Sex: Treating RN: 04-Jan-1956 (68 y.o. NETTY Claudene Blossom Primary Care Syrus Nakama: Rollene Norris Other Clinician: Referring Taelyr Jantz: Treating Truly Stankiewicz/Extender: Rosan Harlene Rollene Norris Devra in Treatment: 17 Wound Status Wound Number: 14 Primary Diabetic Wound/Ulcer of the Lower Extremity Etiology: Wound Location: Left, Posterior Achilles Wound Open Wounding Event: Blister Status: Date Acquired: 04/07/2023 Comorbid Asthma, Sleep Apnea, Arrhythmia, Congestive Heart Failure, Weeks Of Treatment: 11 History: Hypertension, Type II Diabetes Clustered Wound: No Photos Wound Measurements Length: (cm) 1.5 Width: (cm) 1 Depth: (cm) 0.1 Area: (cm) 1.178 Volume: (cm) 0.118 % Reduction in Area: -501% % Reduction in Volume:  -490% Epithelialization: None Wound Description Classification: Grade 1 Wound Margin: Distinct, outline attached Exudate Amount: Medium Exudate Type: Serosanguineous Exudate Color: red, brown Foul Odor After Cleansing: No Slough/Fibrino Yes Wound Bed Nadine, DARINA PARAS ([id number]) [id number] Page 10 of 11 Granulation Amount: Large (67-100%) Exposed Structure Granulation Quality: Red Fascia Exposed: No Necrotic Amount: Small (1-33%) Fat Layer (Subcutaneous Tissue) Exposed: Yes Necrotic Quality: Adherent Slough Tendon Exposed: No Muscle Exposed: No Joint Exposed: No Bone Exposed: No Periwound Skin Texture Texture Color No Abnormalities Noted: No No Abnormalities Noted: No Scarring: Yes Hemosiderin Staining: Yes Moisture Temperature / Pain No Abnormalities Noted: Yes Temperature: No Abnormality Treatment Notes Wound #14 (Achilles) Wound Laterality: Left, Posterior Cleanser Soap and Water Discharge Instruction: May shower and wash wound with dial antibacterial soap and water prior to dressing change. Vashe 5.8 (oz) Discharge Instruction: Cleanse the wound with Vashe prior to applying a clean dressing using gauze sponges, not tissue or cotton balls. Peri-Wound Care Sween Lotion (Moisturizing lotion) Discharge Instruction: Apply moisturizing lotion as directed Topical Primary Dressing PolyMem Silver  Non-Adhesive Dressing, 4.25x4.25 in Discharge Instruction: Apply to wound bed as instructed Secondary Dressing ABD Pad, 8x10 Discharge Instruction: Apply over primary dressing as directed. Secured With Compression Wrap Urgo K2, (equivalent to a 4 layer) two layer compression system, regular Discharge Instruction: Apply Urgo K2 as directed (alternative to 4 layer compression). Compression Stockings Add-Ons Electronic Signature(s) Signed: 07/01/2023 4:44:23 PM By: Claudene Blossom MSN RN CNS WTA Entered By: Claudene Blossom on 06/30/2023  13:22:58 -------------------------------------------------------------------------------- Vitals Details Patient Name: Date of Service: [name], [name] NDY J. 06/30/2023 1:30 PM Medical Record Number: [id number] Patient Account Number: [id number] Date of Birth/Sex: Treating RN: 1955/08/21 (68 y.o. NETTY Claudene Blossom Primary Care Zoila Ditullio: Rollene Norris Other Clinician: Referring Hazelee Harbold: Treating Titilayo Hagans/Extender: Rosan Harlene Rollene Norris Devra in Treatment: 17 Vital Signs Time Taken: 13:09 Temperature (F): 98.0 Height (in): 73 Pulse (bpm): 70 Weight (lbs): 254 Respiratory Rate (breaths/min): 16 Rayburn, Donshay J ([id number]) [id number] Page 11 of 11 Body Mass Index (BMI): 33.5 Blood Pressure (mmHg): 124/81 Reference Range: 80 - 120 mg / dl Electronic Signature(s) Signed: 07/01/2023 4:44:23 PM By: Claudene Blossom MSN RN CNS WTA Entered By: Claudene Blossom on 06/30/2023 13:10:59

## 2023-07-07 ENCOUNTER — Encounter (HOSPITAL_BASED_OUTPATIENT_CLINIC_OR_DEPARTMENT_OTHER): Payer: Medicare HMO | Admitting: Internal Medicine

## 2023-07-07 DIAGNOSIS — I428 Other cardiomyopathies: Secondary | ICD-10-CM | POA: Diagnosis not present

## 2023-07-07 DIAGNOSIS — E11622 Type 2 diabetes mellitus with other skin ulcer: Secondary | ICD-10-CM | POA: Diagnosis not present

## 2023-07-07 DIAGNOSIS — L97828 Non-pressure chronic ulcer of other part of left lower leg with other specified severity: Secondary | ICD-10-CM | POA: Diagnosis not present

## 2023-07-07 DIAGNOSIS — L97818 Non-pressure chronic ulcer of other part of right lower leg with other specified severity: Secondary | ICD-10-CM | POA: Diagnosis not present

## 2023-07-07 DIAGNOSIS — L97328 Non-pressure chronic ulcer of left ankle with other specified severity: Secondary | ICD-10-CM

## 2023-07-07 DIAGNOSIS — I110 Hypertensive heart disease with heart failure: Secondary | ICD-10-CM | POA: Diagnosis not present

## 2023-07-07 DIAGNOSIS — I480 Paroxysmal atrial fibrillation: Secondary | ICD-10-CM | POA: Diagnosis not present

## 2023-07-07 DIAGNOSIS — I87332 Chronic venous hypertension (idiopathic) with ulcer and inflammation of left lower extremity: Secondary | ICD-10-CM | POA: Diagnosis not present

## 2023-07-07 DIAGNOSIS — L97821 Non-pressure chronic ulcer of other part of left lower leg limited to breakdown of skin: Secondary | ICD-10-CM | POA: Diagnosis not present

## 2023-07-07 NOTE — Progress Notes (Signed)
BURNICE, DORNBUSH ([id number]) [id number] Page 1 of 9 Visit Report for 07/07/2023 Arrival Information Details Patient Name: Date of Service: [name], [name] 07/07/2023 1:30 PM Medical Record Number: [id number] Patient Account Number: [id number] Date of Birth/Sex: Treating RN: 1956-02-07 (68 y.o. M) Primary Care Shekira Drummer: Hillard Danker Other Clinician: Referring Ramie Palladino: Treating Jessly Lebeck/Extender: Leandrew Koyanagi in Treatment: 18 Visit Information History Since Last Visit Added or deleted any medications: No Patient Arrived: Ambulatory Any new allergies or adverse reactions: No Arrival Time: 13:13 Had a fall or experienced change in No Accompanied By: self activities of daily living that may affect Transfer Assistance: None risk of falls: Patient Identification Verified: Yes Signs or symptoms of abuse/neglect since last visito No Secondary Verification Process Completed: Yes Hospitalized since last visit: No Patient Requires Transmission-Based Precautions: No Implantable device outside of the clinic excluding No Patient Has Alerts: Yes cellular tissue based products placed in the center Patient Alerts: Patient on Blood Thinner since last visit: Has Dressing in Place as Prescribed: Yes Has Compression in Place as Prescribed: Yes Pain Present Now: No Electronic Signature(s) Signed: 07/07/2023 4:04:36 PM By: Thayer Dallas Entered By: Thayer Dallas on 07/07/2023 13:15:49 -------------------------------------------------------------------------------- Compression Therapy Details Patient Name: Date of Service: [name] Asper NDY J. 07/07/2023 1:30 PM Medical Record Number: [id number] Patient Account Number: [id number] Date of Birth/Sex: Treating RN: 01-11-1956 (68 y.o. [name] Primary Care Gabrielle Mester: Hillard Danker Other Clinician: Referring Matraca Hunkins: Treating Kathlynn Swofford/Extender: Leandrew Koyanagi in Treatment: 18 Compression Therapy Performed for Wound Assessment: Wound #14 Left,Posterior Achilles Performed By: Clinician Shawn Stall, RN Compression Type: Double Layer Post Procedure Diagnosis Same as Pre-procedure Electronic Signature(s) Signed: 07/07/2023 4:14:05 PM By: Shawn Stall RN, BSN Entered By: Shawn Stall on 07/07/2023 13:48:27 [name] ([id number]) [id number] Page 2 of 9 -------------------------------------------------------------------------------- Encounter Discharge Information Details Patient Name: Date of Service: [name], [name] 07/07/2023 1:30 PM Medical Record Number: [id number] Patient Account Number: [id number] Date of Birth/Sex: Treating RN: 12/15/1955 (68 y.o. [name] Primary Care Stacey Sago: Hillard Danker Other Clinician: Referring Alfonse Garringer: Treating Keela Rubert/Extender: Leandrew Koyanagi in Treatment: 18 Encounter Discharge Information Items Post Procedure Vitals Discharge Condition: Stable Temperature (F): 97.7 Ambulatory Status: Ambulatory Pulse (bpm): 73 Discharge Destination: Home Respiratory Rate (breaths/min): 20 Transportation: Private Auto Blood Pressure (mmHg): 100/70 Accompanied By: self Schedule Follow-up Appointment: Yes Clinical Summary of Care: Electronic Signature(s) Signed: 07/07/2023 4:14:05 PM By: Shawn Stall RN, BSN Entered By: Shawn Stall on 07/07/2023 13:50:56 -------------------------------------------------------------------------------- Lower Extremity Assessment Details Patient Name: Date of Service: [name] Asper NDY J. 07/07/2023 1:30 PM Medical Record Number: [id number] Patient Account Number: [id number] Date of Birth/Sex: Treating RN: [birth year] (68 y.o. M) Primary Care Meilyn Heindl: Hillard Danker Other Clinician: Referring Brentney Goldbach: Treating Karmin Kasprzak/Extender: Leandrew Koyanagi in Treatment:  18 Edema Assessment Assessed: [Left: No] [Right: No] Edema: [Left: N] [Right: o] Calf Left: Right: Point of Measurement: 33 cm From Medial Instep 36.4 cm Ankle Left: Right: Point of Measurement: 12 cm From Medial Instep 22.7 cm Vascular Assessment Extremity colors, hair growth, and conditions: Extremity Color: [Left:Hyperpigmented] Hair Growth on Extremity: [Left:No] Temperature of Extremity: [Left:Warm] Capillary Refill: [Left:< 3 seconds] Dependent Rubor: [Left:No Yes] Toe Nail Assessment Left: Right: Thick: No Discolored: Yes Deformed: No Improper Length and Hygiene: No Electronic Signature(s) Signed: 07/07/2023 4:04:36 PM By: Willeen Niece, Dalene Seltzer ([id number]) [id number] Page 3 of 9 Entered By: Thayer Dallas on 07/07/2023 13:28:28 -------------------------------------------------------------------------------- Multi Wound Chart Details Patient Name: Date of  Service: [name], [name] 07/07/2023 1:30 PM Medical Record Number: [id number] Patient Account Number: [id number] Date of Birth/Sex: Treating RN: [birth year] (68 y.o. M) Primary Care Ellen Mayol: Hillard Danker Other Clinician: Referring Shabrea Weldin: Treating Tiya Schrupp/Extender: Leandrew Koyanagi in Treatment: 18 Vital Signs Height(in): 73 Pulse(bpm): 73 Weight(lbs): 254 Blood Pressure(mmHg): 100/70 Body Mass Index(BMI): 33.5 Temperature(F): 97.7 Respiratory Rate(breaths/min): 18 [13:Photos:] [N/A:N/A] Left, Posterior Lower Leg Left, Posterior Achilles N/A Wound Location: Blister Blister N/A Wounding Event: Venous Leg Ulcer Diabetic Wound/Ulcer of the Lower N/A Primary Etiology: Extremity Diabetic Wound/Ulcer of the Lower N/A N/A Secondary Etiology: Extremity Asthma, Sleep Apnea, Arrhythmia, Asthma, Sleep Apnea, Arrhythmia, N/A Comorbid History: Congestive Heart Failure, Congestive Heart Failure, Hypertension, Type II Diabetes Hypertension,  Type II Diabetes 01/20/2023 04/07/2023 N/A Date Acquired: 18 12 N/A Weeks of Treatment: Healed - Epithelialized Open N/A Wound Status: No No N/A Wound Recurrence: 0x0x0 1.1x2x0.1 N/A Measurements L x W x D (cm) 0 1.728 N/A A (cm) : rea 0 0.173 N/A Volume (cm) : 100.00% -781.60% N/A % Reduction in A rea: 100.00% -765.00% N/A % Reduction in Volume: Full Thickness Without Exposed Grade 1 N/A Classification: Support Structures None Present Medium N/A Exudate A mount: N/A Serosanguineous N/A Exudate Type: N/A red, brown N/A Exudate Color: Distinct, outline attached Distinct, outline attached N/A Wound Margin: Large (67-100%) Large (67-100%) N/A Granulation A mount: N/A Red N/A Granulation Quality: None Present (0%) Small (1-33%) N/A Necrotic A mount: Fascia: No Fat Layer (Subcutaneous Tissue): Yes N/A Exposed Structures: Fat Layer (Subcutaneous Tissue): No Fascia: No Tendon: No Tendon: No Muscle: No Muscle: No Joint: No Joint: No Bone: No Bone: No Large (67-100%) Small (1-33%) N/A Epithelialization: N/A Debridement - Excisional N/A Debridement: Pre-procedure Verification/Time Out N/A 13:40 N/A Taken: N/A Lidocaine 4% T opical Solution N/A Pain Control: N/A Subcutaneous, Slough N/A Tissue Debrided: N/A Skin/Subcutaneous Tissue N/A Level: N/A 1.73 N/A Debridement A (sq cm): rea N/A Curette N/A Instrument: N/A Moderate N/A Bleeding: N/A Silver Nitrate N/A Hemostasis A chievedAVONTA, [name] ([id number]) [id number] Page 4 of 9 N/A 0 N/A Procedural Pain: N/A 0 N/A Post Procedural Pain: N/A Procedure was tolerated well N/A Debridement Treatment Response: N/A 1.1x2x0.1 N/A Post Debridement Measurements L x W x D (cm) N/A 0.173 N/A Post Debridement Volume: (cm) Scarring: Yes Scarring: Yes N/A Periwound Skin Texture: No Abnormalities Noted No Abnormalities Noted N/A Periwound Skin Moisture: Hemosiderin Staining:  Yes Hemosiderin Staining: Yes N/A Periwound Skin Color: No Abnormality No Abnormality N/A Temperature: N/A Compression Therapy N/A Procedures Performed: Debridement Treatment Notes Wound #13 (Lower Leg) Wound Laterality: Left, Posterior Cleanser Peri-Wound Care Topical Primary Dressing Secondary Dressing Secured With Compression Wrap Compression Stockings Add-Ons Wound #14 (Achilles) Wound Laterality: Left, Posterior Cleanser Soap and Water Discharge Instruction: May shower and wash wound with dial antibacterial soap and water prior to dressing change. Vashe 5.8 (oz) Discharge Instruction: Cleanse the wound with Vashe prior to applying a clean dressing using gauze sponges, not tissue or cotton balls. Peri-Wound Care Triamcinolone 15 (g) Discharge Instruction: Use triamcinolone 15 (g) as directed Sween Lotion (Moisturizing lotion) Discharge Instruction: Apply moisturizing lotion as directed Topical Gentamicin Discharge Instruction: As directed by physician Mupirocin Ointment Discharge Instruction: Apply Mupirocin (Bactroban) as instructed Primary Dressing PolyMem Silver Non-Adhesive Dressing, 4.25x4.25 in Discharge Instruction: Apply to wound bed as instructed Secondary Dressing ABD Pad, 8x10 Discharge Instruction: Apply over primary dressing as directed. Secured With Compression Wrap Urgo K2, (equivalent to a 4 layer) two layer compression system, regular Discharge Instruction: Apply  Urgo K2 as directed (alternative to 4 layer compression). Compression Stockings Add-Ons Electronic Signature(s) Signed: 07/07/2023 3:27:48 PM By: Geralyn Corwin DO Entered By: Geralyn Corwin on 07/07/2023 14:40:40 [name] ([id number]) [id number] Page 5 of 9 -------------------------------------------------------------------------------- Multi-Disciplinary Care Plan Details Patient Name: Date of Service: [name], [name] 07/07/2023 1:30  PM Medical Record Number: [id number] Patient Account Number: [id number] Date of Birth/Sex: Treating RN: 17-Feb-1956 (68 y.o. [name] Primary Care Braelyn Bordonaro: Hillard Danker Other Clinician: Referring Amairani Shuey: Treating Carolynne Schuchard/Extender: Leandrew Koyanagi in Treatment: 18 Active Inactive Wound/Skin Impairment Nursing Diagnoses: Impaired tissue integrity Knowledge deficit related to ulceration/compromised skin integrity Goals: Patient/caregiver will verbalize understanding of skin care regimen Date Initiated: 03/03/2023 Target Resolution Date: 07/17/2023 Goal Status: Active Ulcer/skin breakdown will have a volume reduction of 30% by week 4 Date Initiated: 03/03/2023 Date Inactivated: 05/05/2023 Target Resolution Date: 04/18/2023 Unmet Reason: see wound Goal Status: Unmet measurements. Interventions: Assess patient/caregiver ability to obtain necessary supplies Assess patient/caregiver ability to perform ulcer/skin care regimen upon admission and as needed Assess ulceration(s) every visit Provide education on ulcer and skin care Notes: Electronic Signature(s) Signed: 07/07/2023 4:14:05 PM By: Shawn Stall RN, BSN Entered By: Shawn Stall on 07/07/2023 13:44:14 -------------------------------------------------------------------------------- Pain Assessment Details Patient Name: Date of Service: [name] Asper NDY J. 07/07/2023 1:30 PM Medical Record Number: [id number] Patient Account Number: [id number] Date of Birth/Sex: Treating RN: 17-Sep-1955 (68 y.o. M) Primary Care Armie Moren: Hillard Danker Other Clinician: Referring Yetunde Leis: Treating Ajai Terhaar/Extender: Leandrew Koyanagi in Treatment: 18 Active Problems Location of Pain Severity and Description of Pain Patient Has Paino No Site Locations [place], [place] J ([id number]) [id number] Page 6 of 9 Pain Management and Medication Current Pain  Management: Electronic Signature(s) Signed: 07/07/2023 4:04:36 PM By: Thayer Dallas Entered By: Thayer Dallas on 07/07/2023 13:16:26 -------------------------------------------------------------------------------- Patient/Caregiver Education Details Patient Name: Date of Service: [name] 1/21/2025andnbsp1:30 PM Medical Record Number: [id number] Patient Account Number: [id number] Date of Birth/Gender: Treating RN: 10-Aug-1955 (68 y.o. [name] Primary Care Physician: Hillard Danker Other Clinician: Referring Physician: Treating Physician/Extender: Leandrew Koyanagi in Treatment: 18 Education Assessment Education Provided To: Patient Education Topics Provided Wound Debridement: Handouts: Wound Debridement Methods: Explain/Verbal Responses: Reinforcements needed Electronic Signature(s) Signed: 07/07/2023 4:14:05 PM By: Shawn Stall RN, BSN Entered By: Shawn Stall on 07/07/2023 13:46:00 -------------------------------------------------------------------------------- Wound Assessment Details Patient Name: Date of Service: [name], [name] NDY J. 07/07/2023 1:30 PM Medical Record Number: [id number] Patient Account Number: [id number] Date of Birth/Sex: Treating RN: Sep 09, 1955 (68 y.o. [name] Primary Care Tyshun Tuckerman: Hillard Danker Other Clinician: Kasandra [name] ([id number]) [id number] Page 7 of 9 Referring Dayshawn Irizarry: Treating Terina Mcelhinny/Extender: Leandrew Koyanagi in Treatment: 18 Wound Status Wound Number: 13 Primary Venous Leg Ulcer Etiology: Wound Location: Left, Posterior Lower Leg Secondary Diabetic Wound/Ulcer of the Lower Extremity Wounding Event: Blister Etiology: Date Acquired: 01/20/2023 Wound Healed - Epithelialized Weeks Of Treatment: 18 Status: Clustered Wound: No Comorbid Asthma, Sleep Apnea, Arrhythmia, Congestive Heart Failure, History: Hypertension,  Type II Diabetes Photos Wound Measurements Length: (cm) Width: (cm) Depth: (cm) Area: (cm) Volume: (cm) 0 % Reduction in Area: 100% 0 % Reduction in Volume: 100% 0 Epithelialization: Large (67-100%) 0 Tunneling: No 0 Undermining: No Wound Description Classification: Full Thickness Without Exposed Support Wound Margin: Distinct, outline attached Exudate Amount: None Present Structures Foul Odor After Cleansing: No Slough/Fibrino No Wound Bed Granulation Amount: Large (67-100%) Exposed Structure Necrotic Amount: None Present (0%) Fascia Exposed: No Fat Layer (Subcutaneous Tissue)  Exposed: No Tendon Exposed: No Muscle Exposed: No Joint Exposed: No Bone Exposed: No Periwound Skin Texture Texture Color No Abnormalities Noted: No No Abnormalities Noted: No Scarring: Yes Hemosiderin Staining: Yes Moisture Temperature / Pain No Abnormalities Noted: Yes Temperature: No Abnormality Treatment Notes Wound #13 (Lower Leg) Wound Laterality: Left, Posterior Cleanser Peri-Wound Care Topical Primary Dressing Secondary Dressing Secured With Compression Wrap Compression Stockings Add-Ons [name], [name] ([id number]) [id number] Page 8 of 9 Electronic Signature(s) Signed: 07/07/2023 4:14:05 PM By: Shawn Stall RN, BSN Entered By: Shawn Stall on 07/07/2023 13:46:23 -------------------------------------------------------------------------------- Wound Assessment Details Patient Name: Date of Service: [name] Asper NDY J. 07/07/2023 1:30 PM Medical Record Number: [id number] Patient Account Number: [id number] Date of Birth/Sex: Treating RN: [birth year] (68 y.o. M) Primary Care Pegge Cumberledge: Hillard Danker Other Clinician: Referring Lailie Smead: Treating Shandee Jergens/Extender: Leandrew Koyanagi in Treatment: 18 Wound Status Wound Number: 14 Primary Diabetic Wound/Ulcer of the Lower Extremity Etiology: Wound Location: Left, Posterior  Achilles Wound Open Wounding Event: Blister Status: Date Acquired: 04/07/2023 Comorbid Asthma, Sleep Apnea, Arrhythmia, Congestive Heart Failure, Weeks Of Treatment: 12 History: Hypertension, Type II Diabetes Clustered Wound: No Photos Wound Measurements Length: (cm) 1.1 Width: (cm) 2 Depth: (cm) 0.1 Area: (cm) 1.728 Volume: (cm) 0.173 % Reduction in Area: -781.6% % Reduction in Volume: -765% Epithelialization: Small (1-33%) Tunneling: No Undermining: No Wound Description Classification: Grade 1 Wound Margin: Distinct, outline attached Exudate Amount: Medium Exudate Type: Serosanguineous Exudate Color: red, brown Foul Odor After Cleansing: No Slough/Fibrino Yes Wound Bed Granulation Amount: Large (67-100%) Exposed Structure Granulation Quality: Red Fascia Exposed: No Necrotic Amount: Small (1-33%) Fat Layer (Subcutaneous Tissue) Exposed: Yes Necrotic Quality: Adherent Slough Tendon Exposed: No Muscle Exposed: No Joint Exposed: No Bone Exposed: No Periwound Skin Texture Texture Color No Abnormalities Noted: No No Abnormalities Noted: No Scarring: Yes Hemosiderin Staining: Yes Moisture Temperature / Pain No Abnormalities Noted: Yes Temperature: No Abnormality [name], [name] ([id number]) [id number] Page 9 of 9 Treatment Notes Wound #14 (Achilles) Wound Laterality: Left, Posterior Cleanser Soap and Water Discharge Instruction: May shower and wash wound with dial antibacterial soap and water prior to dressing change. Vashe 5.8 (oz) Discharge Instruction: Cleanse the wound with Vashe prior to applying a clean dressing using gauze sponges, not tissue or cotton balls. Peri-Wound Care Triamcinolone 15 (g) Discharge Instruction: Use triamcinolone 15 (g) as directed Sween Lotion (Moisturizing lotion) Discharge Instruction: Apply moisturizing lotion as directed Topical Gentamicin Discharge Instruction: As directed by physician Mupirocin  Ointment Discharge Instruction: Apply Mupirocin (Bactroban) as instructed Primary Dressing PolyMem Silver Non-Adhesive Dressing, 4.25x4.25 in Discharge Instruction: Apply to wound bed as instructed Secondary Dressing ABD Pad, 8x10 Discharge Instruction: Apply over primary dressing as directed. Secured With Compression Wrap Urgo K2, (equivalent to a 4 layer) two layer compression system, regular Discharge Instruction: Apply Urgo K2 as directed (alternative to 4 layer compression). Compression Stockings Add-Ons Electronic Signature(s) Signed: 07/07/2023 4:04:36 PM By: Thayer Dallas Entered By: Thayer Dallas on 07/07/2023 13:31:54 -------------------------------------------------------------------------------- Vitals Details Patient Name: Date of Service: [name], [name] NDY J. 07/07/2023 1:30 PM Medical Record Number: [id number] Patient Account Number: [id number] Date of Birth/Sex: Treating RN: July 19, 1955 (68 y.o. M) Primary Care Ranie Chinchilla: Hillard Danker Other Clinician: Referring [place] Seaborn: Treating Darling Cieslewicz/Extender: Leandrew Koyanagi in Treatment: 18 Vital Signs Time Taken: 13:15 Temperature (F): 97.7 Height (in): 73 Pulse (bpm): 73 Weight (lbs): 254 Respiratory Rate (breaths/min): 18 Body Mass Index (BMI): 33.5 Blood Pressure (mmHg): 100/70 Reference Range: 80 - 120 mg / dl Electronic Signature(s) Signed: 07/07/2023  4:04:36 PM By: Thayer Dallas Entered By: Thayer Dallas on 07/07/2023 13:16:19

## 2023-07-07 NOTE — Progress Notes (Signed)
JAYMEN, CHIMA ([id number]) [id number] Page 1 of 12 Visit Report for 07/07/2023 Chief Complaint Document Details Patient Name: Date of Service: [name], [name] 07/07/2023 1:30 PM Medical Record Number: [id number] Patient Account Number: [id number] Date of Birth/Sex: Treating RN: 1955/12/14 (68 y.o. M) Primary Care Provider: Hillard Danker Other Clinician: Referring Provider: Treating Provider/Extender: Leandrew Koyanagi in Treatment: 18 Information Obtained from: Patient Chief Complaint 07/29/2018; patient is here for review of a fairly sizable wound on his right posterior calf 03/14/16/24; patient is here for review of a sizable wound on the left posterior calf Electronic Signature(s) Signed: 07/07/2023 3:27:48 PM By: Geralyn Corwin DO Entered By: Geralyn Corwin on 07/07/2023 14:40:53 -------------------------------------------------------------------------------- Debridement Details Patient Name: Date of Service: [name]. 07/07/2023 1:30 PM Medical Record Number: [id number] Patient Account Number: [id number] Date of Birth/Sex: Treating RN: 10-05-1955 (68 y.o. [name] Primary Care Provider: Hillard Danker Other Clinician: Referring Provider: Treating Provider/Extender: Leandrew Koyanagi in Treatment: 18 Debridement Performed for Assessment: Wound #14 Left,Posterior Achilles Performed By: Physician Geralyn Corwin, DO The following information was scribed by: Shawn Stall The information was scribed for: Geralyn Corwin Debridement Type: Debridement Severity of Tissue Pre Debridement: Fat layer exposed Level of Consciousness (Pre-procedure): Awake and Alert Pre-procedure Verification/Time Out Yes - 13:40 Taken: Start Time: 13:41 Pain Control: Lidocaine 4% T opical Solution Percent of Wound Bed Debrided: 100% T Area Debrided (cm): otal 1.73 Tissue and other  material debrided: Viable, Non-Viable, Slough, Subcutaneous, Slough Level: Skin/Subcutaneous Tissue Debridement Description: Excisional Instrument: Curette Bleeding: Moderate Hemostasis Achieved: Silver Nitrate End Time: 13:47 Procedural Pain: 0 Post Procedural Pain: 0 Response to Treatment: Procedure was tolerated well Level of Consciousness (Post- Awake and Alert procedure): Post Debridement Measurements of Total Wound Length: (cm) 1.1 Width: (cm) 2 [name], [name] ([id number]) [id number] Page 2 of 12 Depth: (cm) 0.1 Volume: (cm) 0.173 Character of Wound/Ulcer Post Debridement: Improved Severity of Tissue Post Debridement: Fat layer exposed Post Procedure Diagnosis Same as Pre-procedure Electronic Signature(s) Signed: 07/07/2023 3:27:48 PM By: Geralyn Corwin DO Signed: 07/07/2023 4:14:05 PM By: Shawn Stall RN, BSN Entered By: Shawn Stall on 07/07/2023 13:48:12 -------------------------------------------------------------------------------- HPI Details Patient Name: Date of Service: [name], [name] NDY [name]. 07/07/2023 1:30 PM Medical Record Number: [id number] Patient Account Number: [id number] Date of Birth/Sex: Treating RN: 1956/04/01 (68 y.o. M) Primary Care Provider: Hillard Danker Other Clinician: Referring Provider: Treating Provider/Extender: Leandrew Koyanagi in Treatment: 18 History of Present Illness HPI Description: ADMISSION 07/29/2018 [name] is a 68 year old man with either prediabetes or diabetes he is on glipizide. In late October to November 2019 he noted a scabbed area on the back of his right calf. He picked this off a few times but it would not heal. He saw his primary physician on 12/5. It was felt at that time that this may actually heal on its own with conservative management. The next visit was on 07/20/2018 noted the area was a lot worse and arranged for his treatment here. He has been topical  antibiotics like Neosporin although he stopped using this when the wound looked worse. He is just been covering this with a clean Band-Aid. He is given Bactrim a week ago and he is finishing this currently. It is made some improvement in the surrounding erythema per the patient. He does not have a history of nonhealing wounds. No prior history of wounds on his legs that he had difficulty healing. No prior skin issues. 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 Past medical history; A. fib status post ablation and recent cardioversion he is on Xarelto. He has a history of systolic heart failure, cervical radiculopathy, cardiomyopathy and type 2 diabetes as discussed ABI in the right leg was noncompressible on the right 2/20; the biopsy I did on the patient last week was negative for malignancy. Culture grew Pseudomonas and methicillin sensitive staph aureus. He is on cefdinir 300 twice a day. I will have to make this a 10 day course. He put him in compression. He states that the redness pain and erythema are a lot better. I suspect the patient has 68 chronic venous insufficiency probably with a secondary cellulitis 2/27; arrives today with copious amounts of drainage coming out of the wound irritating the skin below the wound area. He only renewed the final 3 days of cefdinir today 3/5; came in for a nurse change 3 days ago. Again a lot of drainage noted. Zinc oxide was applied unfortunately the drainage appears to a pool then he has a string of superficial open areas extending down into the Achilles area and some just below the wound. The wound itself does not look too bad. He is completed the antibiotics although apparently there was separation from the original 7 with the last 3 days. Culture grew a few MSSA and a few Pseudomonas 3/12; not too much difference over the last week. He came in for a dressing change on Monday by our nursing staff. Necrotic debris again over the wound surface.  The entire area looks irritated but nontender and I do not think shows obvious evidence of infection. We have not heard anything about the reflux studies. Also notable than in this diabetic man we had noncompressible vessels and although I can feel his pulses easily in his feet I will order arterial studies as well. 3/19-Patient had experience more pain has been dressed with a silver alginate, the wounds all have necrotic debris, very friable with easy bleeding with any kind of surface debridement including with gauze and Anasept and with a #3 curette. His vascular appointments unfortunately were all canceled on account of the virus outbreak resulting in studies only being done for emergent cases. His pulses are easily palpable in the lower extremity. He has open areas below the primary wound where he states the drainage which included purulent material also with some blood pooled and caused breakdown. 3/26; the patient was changed to Santyl with [facility] backing last week. This was largely because the silver alginate was sticking to the wound. He is still having quite a bit of pain. He tells [place] that the reflux studies and arterial studies we had attempted to arrange through vein and vascular are not being booked until mid May. Culture that was done last week showed both Serratia moderate and a few methicillin sensitive staph aureus I started him on Bactrim DS 1 p.o. twice daily on 3/23 for 7 days. He is here for follow-up. 4/3; patient is on Santyl with Hydrofera Blue. Patient complains of pain. Our intake nurse is noted drainage and some odor. He has completed Bactrim recently for Serratia and a few methicillin sensitive staph aureus. After our staff push hard last week we were able to get venous studies as well as arterial studies. His arterial studies showed on the right great toe pressure of 0.86. On the left ABI at 1.47 TBI of 0.87. On both sides waveforms were triphasic VENOUS STUDIES  showed reflux in the femoral  vein, popliteal vein great saphenous vein at the saphenofemoral junction and great saphenous vein at the proximal thigh. Also noted to have age-indeterminate superficial vein thrombosis involving the small saphenous vein. Notable that the patient is already on Xarelto for atrial fibrillation. 4/9; the patient has been to see vascular surgery and reviewed by Dr. Durwin Nora. He was felt to have only minimal superficial venous reflux in the right great greater saphenous vein. For this reason he was not felt to be a candidate for laser ablation of the right greater saphenous vein. A wedge cushion was suggested to keep his leg elevated. He does have deep venous reflux on the right. Culture I did last time showed a combination of Serratia Pseudomonas and methicillin sensitive staph aureus. We have been using Hydrofera Blue to the large wound, Santyl to some of the deep punched out satellite lesions distal to it. AVIV, FLANNELLY ([id number]) [id number] Page 3 of 12 There is some improvement 4/16; the application for Apligraf was put out for further review for material that we have resubmitted. He has the deep area on the right posterior calf which is large and then 3 small punched out areas beneath this. In general his wounds look a lot better 4/23; the patient has his large original wound and then 2 punched out satellite lesions below it on the right posterior calf. I was usually able to use Apligraf #1 to cover the full surface area of the larger wound. We continued with Santyl and Hydrofera Blue on the 2 punched-out areas 5/7; patient has his large original wound and 2 punched out satellite lesions below it in the right posterior calf. Apligraf #2 today. Major improvement in the big wound in terms of wound depth. Punched-out [place] wounds have a very healthy looking wound bed. 5/21-Patient comes back for his large right posterior calf wound for which  she has been an Apligraf #3 today. Wound depth seems to be better, the punched-out wounds appear to have healthy bed with some bleeding 6/4; right posterior calf wound is much better. Apligraf #4 today. The major wound has no wound depth at all. Only 1 of the satellite lesions is still open. The patient has very high blood pressure coming in today with a diastolic blood pressure of 130 after lying there for a few minutes it was down to 110. He states that is running 100 210 diastolic at home. He was high last week as well I thought he was on 50 mg 1/2 tablet twice daily of losartan I told him to double up on that however it turns out he is actually on 50 twice daily. Course he is run out of his medications prematurely. He has a follow-up with his primary's office next Wednesday. 6/18; patient's wounds continue to improve. The small area laterally is just about closed and there is continued epithelialization on the area on the posterior calf. 7/2; we applied his last Apligraf last visit. Early the next week there was a lot of purulent looking drainage that I cultured this grew staph and Pseudomonas however when we had him back for the next visit to 3 days later everything looked a lot better. He did not receive systemic antibiotics. Fortunately we did not have to remove the Apligraf. He has had heart trouble this week he was having an ablation apparently they have discovered a clot in his left atrium they have changed all his anticoagulants as well as his blood pressure medication. 7/16; using Hydrofera Blue. We are making  nice progress towards closure. He has not yet ordered his compression stockings he has his measurements 7/23; dimensions are better. He has a new satellite area superiorly. Both wounds cauterized with silver nitrate. 7/30; absolutely no change in the major wound on the posterior part of the right calf. We have been using Hydrofera Blue for a prolonged period of time and really had a  nice improvement but over the last few weeks this is stalled. There is no depth. He arrived in clinic today with a new area on the right anterior tibia which apparently was an "ingrown hair". It is clearly an open area. This looks like a wrap injury although he was not really aware of it. 8/31; since the patient was last in clinic he was admitted to hospital from 8/17 through 8/22. He had had multiple falls at home including one off a ladder. He was admitted to hospital with mild COVID-19 viral pneumonia. Noted to be hyperkalemic and in acute renal failure. He has chronic systolic heart failure with ejection fraction of 35%. He arrives back in clinic with the original wound on the posterior aspect of the right lower calf looking a lot better. He has eschared areas from falls and lacerations on his anterior knees bilaterally just below the patella and on the mid part of his tibia bilaterally. He seems to have a small area on the right lateral ankle as well 9/10- Patient comes to clinic with a 2 new wounds one on the left anterior shin and one on the right posterior leg both the left knee and right knee areas are healed up. We have been using Santyl to the left anterior leg and silver alginate to right posterior leg wounds. 9/17; the patient is original wound looks good on the posterior right calf. He has traumatic areas on the left anterior shin left anterior patellar tendon and the right mid tibia area. Except for the right posterior calf all required debridement. We have been using Santyl on these areas and silver alginate posteriorly right calf 9/24; the original wound on the posterior calf on the right looks good. This is healthy. There are traumatic wounds in the left anterior shin, left anterior patella in the right mid tibia area are about the same. He has dusky erythema on the left anterior tibia which led me to give him antibiotics last week. This does not look too much different. This is  nontender and I wonder if this is all just venous inflammation. 10/2; the patient has 2 open areas on the right posterior calf both of these look good we have been using silver alginate. He has traumatic wounds on the left and right mid tibia areas. The surface of these wounds is still not ready for closure. We have been using Iodoflex. Finally has areas on the left knee. The latter wounds are all traumatic after a fall 10/9. His original wounds of the right posterior calf are superficial and progressing towards closure. His traumatic wounds on the left anterior tibia and right anterior tibia are considerably improved in terms of the wound surface. He asked me about a skin graft in these areas I do not think this is going to be necessary. Change from Iodosorb to [facility]. The area on the left knee is just about closed as well 10/16; his original wound on the right posterior calf very superficial looks healthy. He has more recent traumatic wounds on the left anterior tibia and right anterior tibia both of these have better  looking surfaces and measuring smaller. Might be beneficial for an advanced treatment product. The area on his left knee has a small superficial scab and we are going to close that when 10/23; his original wound areas on the right posterior calf continue to improve. The more recently traumatic wounds on the left anterior tibia and right anterior tibia both look better in terms of surfaces. No debridement required. We have been using Hydrofera Blue. He is approved for Apligraf 11/3; Apligraf #1 applied to the left anterior tibia right anterior tibia and to 2 small areas remaining on the right posterior calf which were part of his initial wound area. 11/19; Apligraf #2. Left anterior tibia is healed. Right anterior tibia much better. Only one small area remains on the right posterior calf which was part of his initial wound area 12/3; the left anterior tibia has opened up  again. Hyper granulated nodule. Right anterior tibia is superficial and larger. There is no depth of this. I did not place Apligraf today return to [facility] under compression 12/10; the patient has 2 remaining wounds 1 on the right posterior and the other on the left anterior. Both of these look quite good. No debridement is required we have been using Hydrofera Blue under compression. 12/17; the patient has really not closed over which is disappointing. His wraps were very tight and there may be some wrap injury issues. On the left he has the original wound on the medial mid tibia he has a wrap injury on the lateral mid tibia. On the right his posterior areas are superficial but certainly not closed 12/31; the patient has totally closed on the left. He will transition to his own 20/30 below-knee stocking on the left leg today. Interestingly on the right posterior calf the 2 wounds that he had from last time of closed however there is an additional injury in this same area. Almost looks as though there is subcutaneous fluid/blistering. There is no evidence of cellulitis this does not seem tender 06/24/2019; the patient comes in having been placed in his 20/30 stockings on the left leg last week. He states that he noted blisters break open on Saturday and they had opened into wounds on the anterior tibial area on the left by Monday. The area on the posterior right leg looks satisfactory we are still doing that and compression. We are using calcium alginate to all wound areas 1/14; the patient had juxta light however one of them was not the right size which will need to be returned. The new area last week on the left anterior tibia looks a lot better it measures smaller I would say the area on the right is about the same, this is on the right posterior calf. We have been using silver alginate 1/21; he has his bilateral juxta lites however the left anterior wound continues to look better where is the  2 areas on the right posterior calf look about the same. We have been using Hydrofera Blue under compression with the juxta lite stockings now on standby 2/4; the areas on the left are totally healed and he will be discharged into his own stocking/juxta light. On the right posterior calf he has what looks to be a blister with a small area of subdermal hemorrhage this is very tiny but I did I am going to wrap him today and see if this will be reabsorbed by next week 2/11; he comes in this week with expanded wounds on the posterior calf  as well as a new wound on the left anterior. He said the left anterior started a few days ago with a blister that is now surface. He is using his juxta lite stockings at a pressure of 30/40 2/19; we wrapped his left leg last week after a failed attempt to discharge that leg and his juxta lites 2 weeks ago. The leg is again healed he has superficial areas posteriorly on the right leg 2/26; the patient came in initially with right posterior calf wound secondary to minor trauma with secondary infection. He went on to develop a left lower leg wound. In the interim he had congestive heart failure with acute renal failure a fall with bilateral anterior leg wounds and finally Covid requiring admission to pneumonia with pneumonia. He is finally closed. He has external compression stockings bilaterally [juxta lite stockings]. His wounds are healed READMISSION 03/03/2023 SENOVIO, YARA ([id number]) [phone].pdf Page 4 of 12 This is a patient that we had here for a year from 20 20 through 20 21 with a sizable wound on the right posterior calf which was secondary to a combination of chronic venous insufficiency and secondary infection in the wound when he came here. This was a problematic wound in itself however he developed traumatic wounds on his lower extremities after a fall from a ladder during the same admission. Eventually he was healed out and  discharged and juxta lite stockings. He tells me that everything was fine up until about 2-1/2 months ago he developed a blister and an open area on the left posterior calf this time. He saw his primary doctor and received a course of Bactrim. He has been using Silvadene with AandE ointment and covering this with gauze. He is here for review of this area. When he was here in 2021 he also saw Dr. Woodfin Ganja of vein and vascular he had venous reflux studies that showed deep venous reflux but nothing that was amenable to surgery. He also had arterial studies. He was noncompressible on the left but had normal TBIs and triphasic waveforms. In terms of the reflux studies I am not really sure whether the left leg was involved in that study or not we will need to review that. His past medical history includes type type 2 diabetes, chronic venous insufficiency with secondary lymphedema, intermittent A-fib, nonischemic cardiomyopathy, thrombus of the left atrial appendage, hereditary hemochromatosis cytosis. He is now on Coumadin presumably for the atrial fibrillation. ABIs in this clinic were not repeated 9/24; posterior left calf wound. We used Hydrofera Blue under compression. The wound looks better this week better looking surface. 10/1. Posterior left calf wound secondary to venous insufficiency. He was new to the clinic in 2 weeks ago at which time I thought he was going to require a difficult set of mechanical debridements however the wound surface is continuing to improve using Hydrofera Blue although the dimensions not so remarkably improved today 10/8; posterior left calf wound secondary to chronic venous insufficiency. He continues to think nice progress here on this wound the wound is smaller better looking surface. He arrived in clinic today with an abrasion on his Achilles area on the left leg as well as what looked to be a potential deep tissue injury on the lateral lateral part of the left  foot at the level of the base of the fifth metatarsal. 10/15 posterior left calf wound continues to improve. We have been using Prisma under ABDs under Urgo K2 lite's. The areas on the  Achilles and the left lateral foot which were concerning last time of maintain skin integrity. He is not wearing stockings even on the right leg I talked to him about this. 10/22; posterior left calf wound measures slightly larger today. The abrasion injury in the Achilles send on the lateral fifth metatarsal base look improved. We have been using Prisma under compression I am going to change this to [facility] today 10/29; posterior left calf wound continues to progress nicely. Presumably an abrasion injury on the Achilles heel. I changed dressing to Hydrofera Blue. Will need to dress the area on the Achilles today. The area on the fifth metatarsal base is not open 11/5; posterior left calf wound continues to improve nicely presumably an abrasion injury on the left Achilles also looks clean. He has a what looks to be an abrasion on the base of the fifth metatarsal it is not open but it looks as though there is been some friction on this area we will pad this today under the wrap. Patient with chronic venous insufficiency severe hemosiderin deposition and resultant wounds. He has been in the clinic before however that was for a wound on the right posterior calf. Notable he is not wearing stockings even on the uninvolved right leg today 11/11; his original wound on the posterior left calf continues to improve in dimensions. Once again he comes in today with some black eschar around the edge I wonder if this is bleeding. He has the area on the Achilles and now a new area on the left upper lower leg which he says he scratched. And finally he has an area on the right anterior mid tibia which he said he got from taking off his stocking 11/19; he has wounds on his bilateral lower legs. We have been wrapping with Urgo K2's.  He has original wound on the left posterior calf, probably a wrap injury on the left lateral calf, area on the left Achilles, and finally the right medial lower leg. We have been using Hydrofera Blue 12/3; came in this with.a wound on his left posterior leg. since then her has developed a more distal left wound and a right anterior. A wound of the left lateral has healed. We are using HFB with Urgo K2 12/10; the patient initially came in with a wound on his left posterior leg. This is healed today. He subsequently developed an area on the left Achilles heel and an area on the right anterior mid tibia area. We have been using Hydrofera Blue however intake reports that these are getting stuck to the wound. Also he has an area of painful erythema around the posterior left Achilles area. This could be rubbing in the wrap however I think coverage with antibiotics for cellulitis is certainly indicated we will be changing the primary dressing to polymen silver 12/17; his left posterior leg wound which was his original wound has remained closed. However he subsequently developed an area on the left Achilles heel and the right anterior mid tibia. Last week the area on the Achilles heel had surrounding erythema I gave him a course of doxycycline and this seems to have resolved at least in terms of the erythema. He still however having discomfort in this wound. The area on the right anterior mid tibia looks really quite healthy. We have been polymen silver. 20/24; his original wound on the left posterior lower leg is closed he had a secondary wound just on the superior part of his Achilles tendon this  looks better this week no erythema. His last wound is on the right anterior mid tibia. All of this looks quite good. We have been using polymen silver under compression. 06/23/2023; patient presents for follow-up. The right lower extremity wound is healed today. He has compression stockings with him. He has reopened a  wound to the left posterior leg. He has 2 wounds on the left lower extremity. We have been using PolyMem silver under compression therapy to the wound beds. Patient has no issues or complaints today. He denies signs of infection. 06/30/2023; patient presents for follow-up. He has 2 open wounds to the posterior left leg. We have been using PolyMem silver under compression therapy. He has been wearing a compression stocking to the right lower extremity. He currently denies signs of infection. 07/07/2023; patient presents for follow-up. We have been using PolyMem silver under compression therapy to the left lower extremity. The more proximal posterior leg wound has healed. He has no issues or complaints today. He denies signs of infection. Electronic Signature(s) Signed: 07/07/2023 3:27:48 PM By: Geralyn Corwin DO Entered By: Geralyn Corwin on 07/07/2023 14:41:29 [name] ([id number]) [id number] Page 5 of 12 -------------------------------------------------------------------------------- Physical Exam Details Patient Name: Date of Service: [name], [name] 07/07/2023 1:30 PM Medical Record Number: [id number] Patient Account Number: [id number] Date of Birth/Sex: Treating RN: November 25, 1955 (68 y.o. M) Primary Care Provider: Hillard Danker Other Clinician: Referring Provider: Treating Provider/Extender: Leandrew Koyanagi in Treatment: 18 Constitutional respirations regular, non-labored and within target range for patient.. Cardiovascular 2+ dorsalis pedis/posterior tibialis pulses. Psychiatric pleasant and cooperative. Notes Left lower extremity: T the posterior calf there is epithelization to the previous wound site. Below this over the Achilles region there is an open wound with o granulation tissue, areas of hyper granulated tissue and nonviable tissue. No signs of surrounding infection. Decent edema control. Electronic  Signature(s) Signed: 07/07/2023 3:27:48 PM By: Geralyn Corwin DO Entered By: Geralyn Corwin on 07/07/2023 14:42:20 -------------------------------------------------------------------------------- Physician Orders Details Patient Name: Date of Service: [name]. 07/07/2023 1:30 PM Medical Record Number: [id number] Patient Account Number: [id number] Date of Birth/Sex: Treating RN: [birth year] (68 y.o. [name] Primary Care Provider: Hillard Danker Other Clinician: Referring Provider: Treating Provider/Extender: Leandrew Koyanagi in Treatment: 18 The following information was scribed by: Shawn Stall The information was scribed for: Geralyn Corwin Verbal / Phone Orders: No Diagnosis Coding Follow-up Appointments ppointment in 1 week. - Dr .Mikey Bussing 07/14/2023 1230 Return A ppointment in 2 weeks. - Dr .Mikey Bussing 07/21/2023 1100 Return A Return appointment in 3 weeks. - Dr .Mikey Bussing (front office to schedule) Anesthetic (In clinic) Topical Lidocaine 4% applied to wound bed Bathing/ Shower/ Hygiene May shower with protection but do not get wound dressing(s) wet. Protect dressing(s) with water repellant cover (for example, large plastic bag) or a cast cover and may then take shower. Edema Control - Orders / Instructions Elevate legs to the level of the heart or above for 30 minutes daily and/or when sitting for 3-4 times a day throughout the day. Avoid standing for long periods of time. Exercise regularly Wound Treatment Wound #14 - Achilles Wound Laterality: Left, Posterior Cleanser: Soap and Water 1 x Per Week/15 Days Discharge Instructions: May shower and wash wound with dial antibacterial soap and water prior to dressing change. Cleanser: Vashe 5.8 (oz) 1 x Per Week/15 Days Discharge Instructions: Cleanse the wound with Vashe prior to applying a clean dressing using gauze sponges, not tissue or cotton balls.  Peri-Wound Care: Triamcinolone  15 (g) 1 x Per Week/15 Days Discharge Instructions: Use triamcinolone 15 (g) as directed KVAUGHN, LOLLIS ([id number]) [phone].pdf Page 6 of 12 Peri-Wound Care: Sween Lotion (Moisturizing lotion) 1 x Per Week/15 Days Discharge Instructions: Apply moisturizing lotion as directed Topical: Gentamicin 1 x Per Week/15 Days Discharge Instructions: As directed by physician Topical: Mupirocin Ointment 1 x Per Week/15 Days Discharge Instructions: Apply Mupirocin (Bactroban) as instructed Prim Dressing: PolyMem Silver Non-Adhesive Dressing, 4.25x4.25 in 1 x Per Week/15 Days ary Discharge Instructions: Apply to wound bed as instructed Secondary Dressing: ABD Pad, 8x10 1 x Per Week/15 Days Discharge Instructions: Apply over primary dressing as directed. Compression Wrap: Urgo K2, (equivalent to a 4 layer) two layer compression system, regular 1 x Per Week/15 Days Discharge Instructions: Apply Urgo K2 as directed (alternative to 4 layer compression). Electronic Signature(s) Signed: 07/07/2023 3:27:48 PM By: Geralyn Corwin DO Entered By: Geralyn Corwin on 07/07/2023 14:42:42 -------------------------------------------------------------------------------- Problem List Details Patient Name: Date of Service: [name]. 07/07/2023 1:30 PM Medical Record Number: [id number] Patient Account Number: [id number] Date of Birth/Sex: Treating RN: 1955/07/15 (68 y.o. M) Primary Care Provider: Hillard Danker Other Clinician: Referring Provider: Treating Provider/Extender: Leandrew Koyanagi in Treatment: 18 Active Problems ICD-10 Encounter Code Description Active Date MDM Diagnosis I87.332 Chronic venous hypertension (idiopathic) with ulcer and inflammation of left 03/03/2023 No Yes lower extremity L97.328 Non-pressure chronic ulcer of left ankle with other specified severity 04/14/2023 No Yes L97.818 Non-pressure chronic ulcer of other part of  right lower leg with other specified 04/27/2023 No Yes severity E11.622 Type 2 diabetes mellitus with other skin ulcer 03/03/2023 No Yes L97.821 Non-pressure chronic ulcer of other part of left lower leg limited to breakdown 06/23/2023 No Yes of skin Inactive Problems ICD-10 Code Description Active Date Inactive Date JALEAL, VANDEBERG ([id number]) [id number] Page 7 of 12 [phone] Non-pressure chronic ulcer of other part of left lower leg with other specified severity 03/03/2023 03/03/2023 L03.116 Cellulitis of left lower limb 05/26/2023 05/26/2023 Resolved Problems Electronic Signature(s) Signed: 07/07/2023 3:27:48 PM By: Geralyn Corwin DO Entered By: Geralyn Corwin on 07/07/2023 14:40:32 -------------------------------------------------------------------------------- Progress Note Details Patient Name: Date of Service: [name], [name] NDY [name]. 07/07/2023 1:30 PM Medical Record Number: [id number] Patient Account Number: [id number] Date of Birth/Sex: Treating RN: April 21, 1956 (68 y.o. M) Primary Care Provider: Hillard Danker Other Clinician: Referring Provider: Treating Provider/Extender: Leandrew Koyanagi in Treatment: 18 Subjective Chief Complaint Information obtained from Patient 07/29/2018; patient is here for review of a fairly sizable wound on his right posterior calf 03/14/16/24; patient is here for review of a sizable wound on the left posterior calf History of Present Illness (HPI) ADMISSION 07/29/2018 Mr. Mcguffey is a 68 year old man with either prediabetes or diabetes he is on glipizide. In late October to November 2019 he noted a scabbed area on the back of his right calf. He picked this off a few times but it would not heal. He saw his primary physician on 12/5. It was felt at that time that this may actually heal on its own with conservative management. The next visit was on 07/20/2018 noted the area was a lot worse and arranged  for his treatment here. He has been topical antibiotics like Neosporin although he stopped using this when the wound looked worse. He is just been covering this with a clean Band-Aid. He is given Bactrim a week ago and he is finishing this currently. It is made some improvement in the surrounding  erythema per the patient. He does not have a history of nonhealing wounds. No prior history of wounds on his legs that he had difficulty healing. No prior skin issues. 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 Past medical history; A. fib status post ablation and recent cardioversion he is on Xarelto. He has a history of systolic heart failure, cervical radiculopathy, cardiomyopathy and type 2 diabetes as discussed ABI in the right leg was noncompressible on the right 2/20; the biopsy I did on the patient last week was negative for malignancy. Culture grew Pseudomonas and methicillin sensitive staph aureus. He is on cefdinir 300 twice a day. I will have to make this a 10 day course. He put him in compression. He states that the redness pain and erythema are a lot better. I suspect the patient has 68 chronic venous insufficiency probably with a secondary cellulitis 2/27; arrives today with copious amounts of drainage coming out of the wound irritating the skin below the wound area. He only renewed the final 3 days of cefdinir today 3/5; came in for a nurse change 3 days ago. Again a lot of drainage noted. Zinc oxide was applied unfortunately the drainage appears to a pool then he has a string of superficial open areas extending down into the Achilles area and some just below the wound. The wound itself does not look too bad. He is completed the antibiotics although apparently there was separation from the original 7 with the last 3 days. Culture grew a few MSSA and a few Pseudomonas 3/12; not too much difference over the last week. He came in for a dressing change on Monday by our nursing staff.  Necrotic debris again over the wound surface. The entire area looks irritated but nontender and I do not think shows obvious evidence of infection. We have not heard anything about the reflux studies. Also notable than in this diabetic man we had noncompressible vessels and although I can feel his pulses easily in his feet I will order arterial studies as well. 3/19-Patient had experience more pain has been dressed with a silver alginate, the wounds all have necrotic debris, very friable with easy bleeding with any kind of surface debridement including with gauze and Anasept and with a #3 curette. His vascular appointments unfortunately were all canceled on account of the virus outbreak resulting in studies only being done for emergent cases. His pulses are easily palpable in the lower extremity. He has open areas below the primary wound where he states the drainage which included purulent material also with some blood pooled and caused breakdown. 3/26; the patient was changed to Santyl with [facility] backing last week. This was largely because the silver alginate was sticking to the wound. He is still having quite a bit of pain. He tells [place] that the reflux studies and arterial studies we had attempted to arrange through vein and vascular are not being booked until mid May. Culture that was done last week showed both Serratia moderate and a few methicillin sensitive staph aureus I started him on Bactrim DS 1 p.o. twice daily on 3/23 for 7 days. He is here for follow-up. 4/3; patient is on Santyl with Hydrofera Blue. Patient complains of pain. Our intake nurse is noted drainage and some odor. He has completed Bactrim recently for Serratia and a few methicillin sensitive staph aureus. After our staff push hard last week we were able to get venous studies as well as arterial studies. His arterial  studies showed on the right great toe pressure of 0.86. On the left ABI at 1.47 TBI of 0.87. On both  sides waveforms were triphasic VENOUS STUDIES showed reflux in the femoral vein, popliteal vein great saphenous vein at the saphenofemoral junction and great saphenous vein at the proximal thigh. Also noted to have age-indeterminate superficial vein thrombosis involving the small saphenous vein. Notable that the patient is already on Aplin ([id number]) [phone].pdf Page 8 of 12 Xarelto for atrial fibrillation. 4/9; the patient has been to see vascular surgery and reviewed by Dr. Durwin Nora. He was felt to have only minimal superficial venous reflux in the right great greater saphenous vein. For this reason he was not felt to be a candidate for laser ablation of the right greater saphenous vein. A wedge cushion was suggested to keep his leg elevated. He does have deep venous reflux on the right. Culture I did last time showed a combination of Serratia Pseudomonas and methicillin sensitive staph aureus. We have been using Hydrofera Blue to the large wound, Santyl to some of the deep punched out satellite lesions distal to it. There is some improvement 4/16; the application for Apligraf was put out for further review for material that we have resubmitted. He has the deep area on the right posterior calf which is large and then 3 small punched out areas beneath this. In general his wounds look a lot better 4/23; the patient has his large original wound and then 2 punched out satellite lesions below it on the right posterior calf. I was usually able to use Apligraf #1 to cover the full surface area of the larger wound. We continued with Santyl and Hydrofera Blue on the 2 punched-out areas 5/7; patient has his large original wound and 2 punched out satellite lesions below it in the right posterior calf. Apligraf #2 today. Major improvement in the big wound in terms of wound depth. Punched-out [place] wounds have a very healthy looking wound bed. 5/21-Patient comes back for  his large right posterior calf wound for which she has been an Apligraf #3 today. Wound depth seems to be better, the punched-out wounds appear to have healthy bed with some bleeding 6/4; right posterior calf wound is much better. Apligraf #4 today. The major wound has no wound depth at all. Only 1 of the satellite lesions is still open. The patient has very high blood pressure coming in today with a diastolic blood pressure of 130 after lying there for a few minutes it was down to 110. He states that is running 100 210 diastolic at home. He was high last week as well I thought he was on 50 mg 1/2 tablet twice daily of losartan I told him to double up on that however it turns out he is actually on 50 twice daily. Course he is run out of his medications prematurely. He has a follow-up with his primary's office next Wednesday. 6/18; patient's wounds continue to improve. The small area laterally is just about closed and there is continued epithelialization on the area on the posterior calf. 7/2; we applied his last Apligraf last visit. Early the next week there was a lot of purulent looking drainage that I cultured this grew staph and Pseudomonas however when we had him back for the next visit to 3 days later everything looked a lot better. He did not receive systemic antibiotics. Fortunately we did not have to remove the Apligraf. He has had heart trouble this week he was  having an ablation apparently they have discovered a clot in his left atrium they have changed all his anticoagulants as well as his blood pressure medication. 7/16; using Hydrofera Blue. We are making nice progress towards closure. He has not yet ordered his compression stockings he has his measurements 7/23; dimensions are better. He has a new satellite area superiorly. Both wounds cauterized with silver nitrate. 7/30; absolutely no change in the major wound on the posterior part of the right calf. We have been using Hydrofera Blue  for a prolonged period of time and really had a nice improvement but over the last few weeks this is stalled. There is no depth. He arrived in clinic today with a new area on the right anterior tibia which apparently was an "ingrown hair". It is clearly an open area. This looks like a wrap injury although he was not really aware of it. 8/31; since the patient was last in clinic he was admitted to hospital from 8/17 through 8/22. He had had multiple falls at home including one off a ladder. He was admitted to hospital with mild COVID-19 viral pneumonia. Noted to be hyperkalemic and in acute renal failure. He has chronic systolic heart failure with ejection fraction of 35%. He arrives back in clinic with the original wound on the posterior aspect of the right lower calf looking a lot better. He has eschared areas from falls and lacerations on his anterior knees bilaterally just below the patella and on the mid part of his tibia bilaterally. He seems to have a small area on the right lateral ankle as well 9/10- Patient comes to clinic with a 2 new wounds one on the left anterior shin and one on the right posterior leg both the left knee and right knee areas are healed up. We have been using Santyl to the left anterior leg and silver alginate to right posterior leg wounds. 9/17; the patient is original wound looks good on the posterior right calf. He has traumatic areas on the left anterior shin left anterior patellar tendon and the right mid tibia area. Except for the right posterior calf all required debridement. We have been using Santyl on these areas and silver alginate posteriorly right calf 9/24; the original wound on the posterior calf on the right looks good. This is healthy. There are traumatic wounds in the left anterior shin, left anterior patella in the right mid tibia area are about the same. He has dusky erythema on the left anterior tibia which led me to give him antibiotics last week.  This does not look too much different. This is nontender and I wonder if this is all just venous inflammation. 10/2; the patient has 2 open areas on the right posterior calf both of these look good we have been using silver alginate. He has traumatic wounds on the left and right mid tibia areas. The surface of these wounds is still not ready for closure. We have been using Iodoflex. Finally has areas on the left knee. The latter wounds are all traumatic after a fall 10/9. His original wounds of the right posterior calf are superficial and progressing towards closure. His traumatic wounds on the left anterior tibia and right anterior tibia are considerably improved in terms of the wound surface. He asked me about a skin graft in these areas I do not think this is going to be necessary. Change from Iodosorb to [facility]. The area on the left knee is just about closed as well  10/16; his original wound on the right posterior calf very superficial looks healthy. He has more recent traumatic wounds on the left anterior tibia and right anterior tibia both of these have better looking surfaces and measuring smaller. Might be beneficial for an advanced treatment product. The area on his left knee has a small superficial scab and we are going to close that when 10/23; his original wound areas on the right posterior calf continue to improve. The more recently traumatic wounds on the left anterior tibia and right anterior tibia both look better in terms of surfaces. No debridement required. We have been using Hydrofera Blue. He is approved for Apligraf 11/3; Apligraf #1 applied to the left anterior tibia right anterior tibia and to 2 small areas remaining on the right posterior calf which were part of his initial wound area. 11/19; Apligraf #2. Left anterior tibia is healed. Right anterior tibia much better. Only one small area remains on the right posterior calf which was part of his initial wound  area 12/3; the left anterior tibia has opened up again. Hyper granulated nodule. Right anterior tibia is superficial and larger. There is no depth of this. I did not place Apligraf today return to [facility] under compression 12/10; the patient has 2 remaining wounds 1 on the right posterior and the other on the left anterior. Both of these look quite good. No debridement is required we have been using Hydrofera Blue under compression. 12/17; the patient has really not closed over which is disappointing. His wraps were very tight and there may be some wrap injury issues. On the left he has the original wound on the medial mid tibia he has a wrap injury on the lateral mid tibia. On the right his posterior areas are superficial but certainly not closed 12/31; the patient has totally closed on the left. He will transition to his own 20/30 below-knee stocking on the left leg today. Interestingly on the right posterior calf the 2 wounds that he had from last time of closed however there is an additional injury in this same area. Almost looks as though there is subcutaneous fluid/blistering. There is no evidence of cellulitis this does not seem tender 06/24/2019; the patient comes in having been placed in his 20/30 stockings on the left leg last week. He states that he noted blisters break open on Saturday and they had opened into wounds on the anterior tibial area on the left by Monday. The area on the posterior right leg looks satisfactory we are still doing that and compression. We are using calcium alginate to all wound areas 1/14; the patient had juxta light however one of them was not the right size which will need to be returned. The new area last week on the left anterior tibia looks a lot better it measures smaller I would say the area on the right is about the same, this is on the right posterior calf. We have been using silver alginate 1/21; he has his bilateral juxta lites however the left  anterior wound continues to look better where is the 2 areas on the right posterior calf look about the same. We have been using Hydrofera Blue under compression with the juxta lite stockings now on standby 2/4; the areas on the left are totally healed and he will be discharged into his own stocking/juxta light. On the right posterior calf he has what looks to be a blister with a small area of subdermal hemorrhage this is very tiny  but I did I am going to wrap him today and see if this will be reabsorbed by next week 2/11; he comes in this week with expanded wounds on the posterior calf as well as a new wound on the left anterior. He said the left anterior started a few days ago with a blister that is now surface. He is using his juxta lite stockings at a pressure of 30/40 2/19; we wrapped his left leg last week after a failed attempt to discharge that leg and his juxta lites 2 weeks ago. The leg is again healed he has superficial areas posteriorly on the right leg 2/26; the patient came in initially with right posterior calf wound secondary to minor trauma with secondary infection. He went on to develop a left lower leg [name], [name] ([id number]) [id number] Page 9 of 12 wound. In the interim he had congestive heart failure with acute renal failure a fall with bilateral anterior leg wounds and finally Covid requiring admission to pneumonia with pneumonia. He is finally closed. He has external compression stockings bilaterally [juxta lite stockings]. His wounds are healed READMISSION 03/03/2023 This is a patient that we had here for a year from 20 20 through 20 21 with a sizable wound on the right posterior calf which was secondary to a combination of chronic venous insufficiency and secondary infection in the wound when he came here. This was a problematic wound in itself however he developed traumatic wounds on his lower extremities after a fall from a ladder during  the same admission. Eventually he was healed out and discharged and juxta lite stockings. He tells me that everything was fine up until about 2-1/2 months ago he developed a blister and an open area on the left posterior calf this time. He saw his primary doctor and received a course of Bactrim. He has been using Silvadene with AandE ointment and covering this with gauze. He is here for review of this area. When he was here in 2021 he also saw Dr. Woodfin Ganja of vein and vascular he had venous reflux studies that showed deep venous reflux but nothing that was amenable to surgery. He also had arterial studies. He was noncompressible on the left but had normal TBIs and triphasic waveforms. In terms of the reflux studies I am not really sure whether the left leg was involved in that study or not we will need to review that. His past medical history includes type type 2 diabetes, chronic venous insufficiency with secondary lymphedema, intermittent A-fib, nonischemic cardiomyopathy, thrombus of the left atrial appendage, hereditary hemochromatosis cytosis. He is now on Coumadin presumably for the atrial fibrillation. ABIs in this clinic were not repeated 9/24; posterior left calf wound. We used Hydrofera Blue under compression. The wound looks better this week better looking surface. 10/1. Posterior left calf wound secondary to venous insufficiency. He was new to the clinic in 2 weeks ago at which time I thought he was going to require a difficult set of mechanical debridements however the wound surface is continuing to improve using Hydrofera Blue although the dimensions not so remarkably improved today 10/8; posterior left calf wound secondary to chronic venous insufficiency. He continues to think nice progress here on this wound the wound is smaller better looking surface. He arrived in clinic today with an abrasion on his Achilles area on the left leg as well as what looked to be a potential deep  tissue injury on the lateral lateral part of the left foot  at the level of the base of the fifth metatarsal. 10/15 posterior left calf wound continues to improve. We have been using Prisma under ABDs under Urgo K2 lite's. The areas on the Achilles and the left lateral foot which were concerning last time of maintain skin integrity. He is not wearing stockings even on the right leg I talked to him about this. 10/22; posterior left calf wound measures slightly larger today. The abrasion injury in the Achilles send on the lateral fifth metatarsal base look improved. We have been using Prisma under compression I am going to change this to [facility] today 10/29; posterior left calf wound continues to progress nicely. Presumably an abrasion injury on the Achilles heel. I changed dressing to Hydrofera Blue. Will need to dress the area on the Achilles today. The area on the fifth metatarsal base is not open 11/5; posterior left calf wound continues to improve nicely presumably an abrasion injury on the left Achilles also looks clean. He has a what looks to be an abrasion on the base of the fifth metatarsal it is not open but it looks as though there is been some friction on this area we will pad this today under the wrap. Patient with chronic venous insufficiency severe hemosiderin deposition and resultant wounds. He has been in the clinic before however that was for a wound on the right posterior calf. Notable he is not wearing stockings even on the uninvolved right leg today 11/11; his original wound on the posterior left calf continues to improve in dimensions. Once again he comes in today with some black eschar around the edge I wonder if this is bleeding. He has the area on the Achilles and now a new area on the left upper lower leg which he says he scratched. And finally he has an area on the right anterior mid tibia which he said he got from taking off his stocking 11/19; he has wounds on his  bilateral lower legs. We have been wrapping with Urgo K2's. He has original wound on the left posterior calf, probably a wrap injury on the left lateral calf, area on the left Achilles, and finally the right medial lower leg. We have been using Hydrofera Blue 12/3; came in this with.a wound on his left posterior leg. since then her has developed a more distal left wound and a right anterior. A wound of the left lateral has healed. We are using HFB with Urgo K2 12/10; the patient initially came in with a wound on his left posterior leg. This is healed today. He subsequently developed an area on the left Achilles heel and an area on the right anterior mid tibia area. We have been using Hydrofera Blue however intake reports that these are getting stuck to the wound. Also he has an area of painful erythema around the posterior left Achilles area. This could be rubbing in the wrap however I think coverage with antibiotics for cellulitis is certainly indicated we will be changing the primary dressing to polymen silver 12/17; his left posterior leg wound which was his original wound has remained closed. However he subsequently developed an area on the left Achilles heel and the right anterior mid tibia. Last week the area on the Achilles heel had surrounding erythema I gave him a course of doxycycline and this seems to have resolved at least in terms of the erythema. He still however having discomfort in this wound. The area on the right anterior mid tibia looks really quite  healthy. We have been polymen silver. 20/24; his original wound on the left posterior lower leg is closed he had a secondary wound just on the superior part of his Achilles tendon this looks better this week no erythema. His last wound is on the right anterior mid tibia. All of this looks quite good. We have been using polymen silver under compression. 06/23/2023; patient presents for follow-up. The right lower extremity wound is healed  today. He has compression stockings with him. He has reopened a wound to the left posterior leg. He has 2 wounds on the left lower extremity. We have been using PolyMem silver under compression therapy to the wound beds. Patient has no issues or complaints today. He denies signs of infection. 06/30/2023; patient presents for follow-up. He has 2 open wounds to the posterior left leg. We have been using PolyMem silver under compression therapy. He has been wearing a compression stocking to the right lower extremity. He currently denies signs of infection. 07/07/2023; patient presents for follow-up. We have been using PolyMem silver under compression therapy to the left lower extremity. The more proximal posterior leg wound has healed. He has no issues or complaints today. He denies signs of infection. [address] AKSHIT, [name] ([id number]) [id number] Page 10 of 12 Constitutional respirations regular, non-labored and within target range for patient.. Vitals Time Taken: 1:15 PM, Height: 73 in, Weight: 254 lbs, BMI: 33.5, Temperature: 97.7 F, Pulse: 73 bpm, Respiratory Rate: 18 breaths/min, Blood Pressure: 100/70 mmHg. Cardiovascular 2+ dorsalis pedis/posterior tibialis pulses. Psychiatric pleasant and cooperative. General Notes: Left lower extremity: T the posterior calf there is epithelization to the previous wound site. Below this over the Achilles region there is an open o wound with granulation tissue, areas of hyper granulated tissue and nonviable tissue. No signs of surrounding infection. Decent edema control. Integumentary (Hair, Skin) Wound #13 status is Healed - Epithelialized. Original cause of wound was Blister. The date acquired was: 01/20/2023. The wound has been in treatment 18 weeks. The wound is located on the Left,Posterior Lower Leg. The wound measures 0cm length x 0cm width x 0cm depth; 0cm^2 area and 0cm^3 volume. There is no tunneling or undermining  noted. There is a none present amount of drainage noted. The wound margin is distinct with the outline attached to the wound base. There is large (67-100%) granulation within the wound bed. There is no necrotic tissue within the wound bed. The periwound skin appearance had no abnormalities noted for moisture. The periwound skin appearance exhibited: Scarring, Hemosiderin Staining. Periwound temperature was noted as No Abnormality. Wound #14 status is Open. Original cause of wound was Blister. The date acquired was: 04/07/2023. The wound has been in treatment 12 weeks. The wound is located on the Left,Posterior Achilles. The wound measures 1.1cm length x 2cm width x 0.1cm depth; 1.728cm^2 area and 0.173cm^3 volume. There is Fat Layer (Subcutaneous Tissue) exposed. There is no tunneling or undermining noted. There is a medium amount of serosanguineous drainage noted. The wound margin is distinct with the outline attached to the wound base. There is large (67-100%) red granulation within the wound bed. There is a small (1-33%) amount of necrotic tissue within the wound bed including Adherent Slough. The periwound skin appearance had no abnormalities noted for moisture. The periwound skin appearance exhibited: Scarring, Hemosiderin Staining. Periwound temperature was noted as No Abnormality. Assessment Active Problems ICD-10 Chronic venous hypertension (idiopathic) with ulcer and inflammation of left lower extremity Non-pressure chronic ulcer of left ankle with other specified  severity Non-pressure chronic ulcer of other part of right lower leg with other specified severity Type 2 diabetes mellitus with other skin ulcer Non-pressure chronic ulcer of other part of left lower leg limited to breakdown of skin 1 of patient's left posterior leg wounds have healed. The remaining wound over the Achilles region appears well-healing. I used silver nitrate to the hyper granulated area. Also debrided nonviable  tissue. I recommended continuing with PolyMem silver and adding antibiotic ointment under compression therapy. Follow-up in 1 week. Continue compression stocking daily on the right leg. Procedures Wound #14 Pre-procedure diagnosis of Wound #14 is a Diabetic Wound/Ulcer of the Lower Extremity located on the Left,Posterior Achilles .Severity of Tissue Pre Debridement is: Fat layer exposed. There was a Excisional Skin/Subcutaneous Tissue Debridement with a total area of 1.73 sq cm performed by Geralyn Corwin, DO. With the following instrument(s): Curette to remove Viable and Non-Viable tissue/material. Material removed includes Subcutaneous Tissue and Slough and after achieving pain control using Lidocaine 4% T opical Solution. A time out was conducted at 13:40, prior to the start of the procedure. A Moderate amount of bleeding was controlled with Silver Nitrate. The procedure was tolerated well with a pain level of 0 throughout and a pain level of 0 following the procedure. Post Debridement Measurements: 1.1cm length x 2cm width x 0.1cm depth; 0.173cm^3 volume. Character of Wound/Ulcer Post Debridement is improved. Severity of Tissue Post Debridement is: Fat layer exposed. Post procedure Diagnosis Wound #14: Same as Pre-Procedure Pre-procedure diagnosis of Wound #14 is a Diabetic Wound/Ulcer of the Lower Extremity located on the Left,Posterior Achilles . There was a Double Layer Compression Therapy Procedure by Shawn Stall, RN. Post procedure Diagnosis Wound #14: Same as Pre-Procedure Plan Follow-up Appointments: Return Appointment in 1 week. - Dr .Mikey Bussing 07/14/2023 1230 Return Appointment in 2 weeks. - Dr .Mikey Bussing 07/21/2023 1100 Return appointment in 3 weeks. - Dr .Mikey Bussing (front office to schedule) Anesthetic: (In clinic) Topical Lidocaine 4% applied to wound bed Bathing/ Shower/ Hygiene: May shower with protection but do not get wound dressing(s) wet. Protect dressing(s) with water  repellant cover (for example, large plastic bag) or a cast cover [name], [name] ([id number]) [id number] Page 11 of 12 and may then take shower. Edema Control - Orders / Instructions: Elevate legs to the level of the heart or above for 30 minutes daily and/or when sitting for 3-4 times a day throughout the day. Avoid standing for long periods of time. Exercise regularly WOUND #14: - Achilles Wound Laterality: Left, Posterior Cleanser: Soap and Water 1 x Per Week/15 Days Discharge Instructions: May shower and wash wound with dial antibacterial soap and water prior to dressing change. Cleanser: Vashe 5.8 (oz) 1 x Per Week/15 Days Discharge Instructions: Cleanse the wound with Vashe prior to applying a clean dressing using gauze sponges, not tissue or cotton balls. Peri-Wound Care: Triamcinolone 15 (g) 1 x Per Week/15 Days Discharge Instructions: Use triamcinolone 15 (g) as directed Peri-Wound Care: Sween Lotion (Moisturizing lotion) 1 x Per Week/15 Days Discharge Instructions: Apply moisturizing lotion as directed Topical: Gentamicin 1 x Per Week/15 Days Discharge Instructions: As directed by physician Topical: Mupirocin Ointment 1 x Per Week/15 Days Discharge Instructions: Apply Mupirocin (Bactroban) as instructed Prim Dressing: PolyMem Silver Non-Adhesive Dressing, 4.25x4.25 in 1 x Per Week/15 Days ary Discharge Instructions: Apply to wound bed as instructed Secondary Dressing: ABD Pad, 8x10 1 x Per Week/15 Days Discharge Instructions: Apply over primary dressing as directed. Com pression Wrap: Urgo K2, (equivalent to a  4 layer) two layer compression system, regular 1 x Per Week/15 Days Discharge Instructions: Apply Urgo K2 as directed (alternative to 4 layer compression). 1. In office sharp debridement 2. Silver nitrate 3. Antibiotic ointment with PolyMem silver under compression therapy 4. Follow-up in 1 week Electronic Signature(s) Signed: 07/07/2023  3:27:48 PM By: Geralyn Corwin DO Entered By: Geralyn Corwin on 07/07/2023 14:45:44 -------------------------------------------------------------------------------- SuperBill Details Patient Name: Date of Service: [name] Coco. 07/07/2023 Medical Record Number: [id number] Patient Account Number: [id number] Date of Birth/Sex: Treating RN: 24-Dec-1955 (68 y.o. Harlon Flor, Yvonne Kendall Primary Care Provider: Hillard Danker Other Clinician: Referring Provider: Treating Provider/Extender: Leandrew Koyanagi in Treatment: 18 Diagnosis Coding ICD-10 Codes Code Description 2536983066 Chronic venous hypertension (idiopathic) with ulcer and inflammation of left lower extremity L97.328 Non-pressure chronic ulcer of left ankle with other specified severity L97.818 Non-pressure chronic ulcer of other part of right lower leg with other specified severity E11.622 Type 2 diabetes mellitus with other skin ulcer L97.821 Non-pressure chronic ulcer of other part of left lower leg limited to breakdown of skin Facility Procedures : CPT4 Code: [id number] Description: 11042 - DEB SUBQ TISSUE 20 SQ CM/< ICD-10 Diagnosis Description L97.328 Non-pressure chronic ulcer of left ankle with other specified severity I87.332 Chronic venous hypertension (idiopathic) with ulcer and inflammation of left l E11.622  Type 2 diabetes mellitus with other skin ulcer Modifier: ower extremity Quantity: 1 Physician Procedures : CPT4 Code Description Modifier [id number] 11042 - WC PHYS SUBQ TISS 20 SQ CM RONAL, KALAL [name] ([id number]) [id number] ICD-10 Diagnosis Description L97.328 Non-pressure chronic ulcer of left ankle with other specified severity  I87.332 Chronic venous hypertension (idiopathic) with ulcer and inflammation of left lower extremity E11.622 Type 2 diabetes mellitus with other skin ulcer Quantity: 1 Page 12 of 12 Electronic Signature(s) Signed: 07/07/2023 3:27:48 PM  By: Geralyn Corwin DO Entered By: Geralyn Corwin on 07/07/2023 14:45:57

## 2023-07-14 ENCOUNTER — Encounter (HOSPITAL_BASED_OUTPATIENT_CLINIC_OR_DEPARTMENT_OTHER): Payer: Medicare HMO | Admitting: Internal Medicine

## 2023-07-14 DIAGNOSIS — L97821 Non-pressure chronic ulcer of other part of left lower leg limited to breakdown of skin: Secondary | ICD-10-CM | POA: Diagnosis not present

## 2023-07-14 DIAGNOSIS — I428 Other cardiomyopathies: Secondary | ICD-10-CM | POA: Diagnosis not present

## 2023-07-14 DIAGNOSIS — E11622 Type 2 diabetes mellitus with other skin ulcer: Secondary | ICD-10-CM

## 2023-07-14 DIAGNOSIS — I480 Paroxysmal atrial fibrillation: Secondary | ICD-10-CM | POA: Diagnosis not present

## 2023-07-14 DIAGNOSIS — L97328 Non-pressure chronic ulcer of left ankle with other specified severity: Secondary | ICD-10-CM

## 2023-07-14 DIAGNOSIS — L97828 Non-pressure chronic ulcer of other part of left lower leg with other specified severity: Secondary | ICD-10-CM | POA: Diagnosis not present

## 2023-07-14 DIAGNOSIS — I87332 Chronic venous hypertension (idiopathic) with ulcer and inflammation of left lower extremity: Secondary | ICD-10-CM | POA: Diagnosis not present

## 2023-07-14 DIAGNOSIS — L97818 Non-pressure chronic ulcer of other part of right lower leg with other specified severity: Secondary | ICD-10-CM | POA: Diagnosis not present

## 2023-07-14 DIAGNOSIS — I110 Hypertensive heart disease with heart failure: Secondary | ICD-10-CM | POA: Diagnosis not present

## 2023-07-16 ENCOUNTER — Ambulatory Visit: Payer: Medicare HMO | Attending: Cardiology | Admitting: *Deleted

## 2023-07-16 DIAGNOSIS — Z5181 Encounter for therapeutic drug level monitoring: Secondary | ICD-10-CM | POA: Diagnosis not present

## 2023-07-16 DIAGNOSIS — I513 Intracardiac thrombosis, not elsewhere classified: Secondary | ICD-10-CM

## 2023-07-16 DIAGNOSIS — I4891 Unspecified atrial fibrillation: Secondary | ICD-10-CM | POA: Diagnosis not present

## 2023-07-16 LAB — POCT INR: INR: 1.7 — AB (ref 2.0–3.0)

## 2023-07-16 NOTE — Patient Instructions (Signed)
Description   Today take 2 tablets of warfarin then continue taking Warfarin 1 tablet daily except for 1.5 tablets on Tuesday, and Thursday.  Recheck INR in 4 weeks. Coumadin Clinic [phone].

## 2023-07-19 ENCOUNTER — Other Ambulatory Visit (HOSPITAL_COMMUNITY): Payer: Self-pay | Admitting: Family Medicine

## 2023-07-19 ENCOUNTER — Other Ambulatory Visit: Payer: Self-pay | Admitting: Internal Medicine

## 2023-07-19 ENCOUNTER — Other Ambulatory Visit: Payer: Self-pay | Admitting: Cardiology

## 2023-07-19 DIAGNOSIS — I480 Paroxysmal atrial fibrillation: Secondary | ICD-10-CM

## 2023-07-21 ENCOUNTER — Encounter (HOSPITAL_BASED_OUTPATIENT_CLINIC_OR_DEPARTMENT_OTHER): Payer: Medicare HMO | Attending: Internal Medicine | Admitting: Internal Medicine

## 2023-07-21 DIAGNOSIS — I87332 Chronic venous hypertension (idiopathic) with ulcer and inflammation of left lower extremity: Secondary | ICD-10-CM | POA: Insufficient documentation

## 2023-07-21 DIAGNOSIS — L97328 Non-pressure chronic ulcer of left ankle with other specified severity: Secondary | ICD-10-CM | POA: Insufficient documentation

## 2023-07-21 DIAGNOSIS — E11621 Type 2 diabetes mellitus with foot ulcer: Secondary | ICD-10-CM | POA: Insufficient documentation

## 2023-07-21 DIAGNOSIS — E11622 Type 2 diabetes mellitus with other skin ulcer: Secondary | ICD-10-CM | POA: Insufficient documentation

## 2023-07-28 ENCOUNTER — Encounter (HOSPITAL_BASED_OUTPATIENT_CLINIC_OR_DEPARTMENT_OTHER): Payer: Medicare HMO | Admitting: Internal Medicine

## 2023-07-28 DIAGNOSIS — E11622 Type 2 diabetes mellitus with other skin ulcer: Secondary | ICD-10-CM | POA: Diagnosis not present

## 2023-07-28 DIAGNOSIS — I87332 Chronic venous hypertension (idiopathic) with ulcer and inflammation of left lower extremity: Secondary | ICD-10-CM

## 2023-07-28 DIAGNOSIS — L97328 Non-pressure chronic ulcer of left ankle with other specified severity: Secondary | ICD-10-CM | POA: Diagnosis not present

## 2023-07-28 DIAGNOSIS — E11621 Type 2 diabetes mellitus with foot ulcer: Secondary | ICD-10-CM | POA: Diagnosis not present

## 2023-08-01 DIAGNOSIS — E1169 Type 2 diabetes mellitus with other specified complication: Secondary | ICD-10-CM | POA: Diagnosis not present

## 2023-08-02 ENCOUNTER — Other Ambulatory Visit (HOSPITAL_COMMUNITY): Payer: Self-pay | Admitting: Cardiology

## 2023-08-03 ENCOUNTER — Other Ambulatory Visit: Payer: Self-pay | Admitting: *Deleted

## 2023-08-03 DIAGNOSIS — I480 Paroxysmal atrial fibrillation: Secondary | ICD-10-CM

## 2023-08-03 MED ORDER — WARFARIN SODIUM 5 MG PO TABS: ORAL_TABLET | ORAL | 1 refills | Status: DC

## 2023-08-03 NOTE — Telephone Encounter (Signed)
Warfarin 5mg  refill Afib Last INR 07/16/23 Last OV 01/30/23

## 2023-08-04 ENCOUNTER — Encounter (HOSPITAL_BASED_OUTPATIENT_CLINIC_OR_DEPARTMENT_OTHER): Payer: Medicare HMO | Admitting: Internal Medicine

## 2023-08-04 DIAGNOSIS — I87332 Chronic venous hypertension (idiopathic) with ulcer and inflammation of left lower extremity: Secondary | ICD-10-CM

## 2023-08-04 DIAGNOSIS — L97328 Non-pressure chronic ulcer of left ankle with other specified severity: Secondary | ICD-10-CM

## 2023-08-04 DIAGNOSIS — E11622 Type 2 diabetes mellitus with other skin ulcer: Secondary | ICD-10-CM | POA: Diagnosis not present

## 2023-08-04 DIAGNOSIS — E11621 Type 2 diabetes mellitus with foot ulcer: Secondary | ICD-10-CM | POA: Diagnosis not present

## 2023-08-10 ENCOUNTER — Ambulatory Visit: Payer: Medicare HMO | Attending: Cardiovascular Disease

## 2023-08-10 ENCOUNTER — Ambulatory Visit (HOSPITAL_BASED_OUTPATIENT_CLINIC_OR_DEPARTMENT_OTHER): Payer: Medicare HMO | Admitting: Cardiology

## 2023-08-10 ENCOUNTER — Encounter (HOSPITAL_BASED_OUTPATIENT_CLINIC_OR_DEPARTMENT_OTHER): Payer: Self-pay | Admitting: Cardiology

## 2023-08-10 VITALS — BP 108/68 | HR 62 | Ht 73.0 in | Wt 263.9 lb

## 2023-08-10 DIAGNOSIS — I4891 Unspecified atrial fibrillation: Secondary | ICD-10-CM | POA: Diagnosis not present

## 2023-08-10 DIAGNOSIS — Z7901 Long term (current) use of anticoagulants: Secondary | ICD-10-CM | POA: Diagnosis not present

## 2023-08-10 DIAGNOSIS — I428 Other cardiomyopathies: Secondary | ICD-10-CM | POA: Diagnosis not present

## 2023-08-10 DIAGNOSIS — E785 Hyperlipidemia, unspecified: Secondary | ICD-10-CM | POA: Diagnosis not present

## 2023-08-10 DIAGNOSIS — E1169 Type 2 diabetes mellitus with other specified complication: Secondary | ICD-10-CM

## 2023-08-10 DIAGNOSIS — I480 Paroxysmal atrial fibrillation: Secondary | ICD-10-CM | POA: Diagnosis not present

## 2023-08-10 DIAGNOSIS — I2510 Atherosclerotic heart disease of native coronary artery without angina pectoris: Secondary | ICD-10-CM | POA: Diagnosis not present

## 2023-08-10 DIAGNOSIS — D6869 Other thrombophilia: Secondary | ICD-10-CM

## 2023-08-10 LAB — POCT INR: INR: 1.8 — AB (ref 2.0–3.0)

## 2023-08-10 NOTE — Patient Instructions (Signed)
 Description   Take 1.5 tablets today and then START taking Warfarin 1 tablet daily except for 1.5 tablets on Monday, Wednesday, Friday, and Saturday.  Stay consistent with premier protein drinks (1 time daily)  Recheck INR in 2 weeks. Coumadin Clinic [phone]

## 2023-08-10 NOTE — Progress Notes (Signed)
 Cardiology Office Note:  .    Date:  08/10/2023  ID:  [name], DOB Feb 03, 1956, MRN [id number] PCP: Myrlene Broker, MD  [facility] Providers Cardiologist:  Jodelle Red, MD     History of Present Illness: .    [name] is a 68 y.o. male with a hx of persistent atrial fibrillation, atrial flutter with prior ablation in 2012, prior cardioversions, HTN, chronic systolic heart failure (thought to be tachycardia induced) with EF 30-35% on TTE 04/2018, OSA who is seen in follow up today. I first saw him as a new consult 12/28/18.   Recent CV history: He has followed with Dr. 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 cMRI with scar. Recommended for Biotronik ICD, which was successfully implanted 05/12/2022. Echo 05/2022 with EF 50%, no LVOT gradient  Today: Continues to deal with healing diabetic leg ulcer, right leg is now healed but dealing with left leg wound.   Reports 3 lbs weight gain over the last week. Has cut back on his alcohol but drinking a lot of water. No SOB, no worsening swelling.   Has coumadin clinic visit today--we don't have a POC INR machine here, offered venipuncture but he would prefer POC. No bleeding.   Hasn't felt heart racing at all. No chest pain. Using CPAP routinely, sleeps well. No worsening LE edema. No syncope.  Has upcoming appt with Dr. Okey Dupre on 3/5, will have labs at that time.  ROS:  Please see the history of present illness. ROS otherwise negative except as noted.   Studies Reviewed: [name]         Physical Exam:    VS:  BP 108/68   Pulse 62   Ht 6\' 1"  (1.854 m)   Wt 263 lb 14.4 oz (119.7 kg)   SpO2 96%   BMI 34.82 kg/m    Wt Readings from Last 3 Encounters:  08/10/23 263 lb 14.4 oz (119.7 kg)  06/04/23 260 lb (117.9 kg)  02/11/23 259 lb (117.5 kg)    GEN: Well nourished, well developed in no acute distress HEENT: Normal, moist mucous  membranes NECK: No JVD CARDIAC: regular rhythm, normal S1 and S2, no rubs or gallops. No murmur. VASCULAR: Radial pulses 2+ bilaterally. No carotid bruits RESPIRATORY:  Clear to auscultation without rales, wheezing or rhonchi  ABDOMEN: Soft, non-tender, non-distended MUSCULOSKELETAL:  Ambulates independently SKIN: Warm and dry, L calf in Unna boot, R in compressions NEUROLOGIC:  Alert and oriented x 3. No focal neuro deficits noted. PSYCHIATRIC:  Normal affect    ASSESSMENT AND PLAN: .    History of atrial fibrillation and atrial flutter, s/p prior cardioversions and ablation -sinus rhythm today -CHADSVASC=3 -Has OSA, continue CPAP -continue coumadin given LAA thrombus with both rivaroxaban and dabigatran.   Nonischemic cardiomyopathy, with chronic systolic and diastolic heart failure:  -euvolemic, NYHA class I today -follows with Dr. Shirlee Latch -does have prior history of hospitalization for heart failure requiring milrinone -cath with nonobstructive CAD, now s/p ICD implantation -continue carvedilol 12.5 mg BID, empagliflozin 10 mg daily, entresto 97-103 mg BID, spironolactone 25 mg daily, semaglutide -on GLP1RA and SGLT2i given diabetes. Followed by Dr. Lafe Garin in endocrinology. 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 Also heterozygous for hemochromatosis gene but has not required phlebotomy  L calf ulcer -previously had a severe one on the R side, took >1 year to heal and multiple skin grafts -had imaging of his  lower extremity arteries and veins per report (I cannot see), told it was normal -would check ABI once wound is healed   Nonobstructive CAD -continue rosuvastatin -no aspirin as he is on coumadin   Type II diabetes with hyperlipidemia: -on semaglutide (GLP1RA), empagliflozin (SGLT2i) and insulin -no aspirin as he is on coumadin   Cardiac risk counseling and prevention recommendations: -recommend heart healthy/Mediterranean diet, with whole grains, fruits,  vegetable, fish, lean meats, nuts, and olive oil. Limit salt. -recommend moderate walking, 3-5 times/week for 30-50 minutes each session. Aim for at least 150 minutes.week. Goal should be pace of 3 miles/hours, or walking 1.5 miles in 30 minutes -recommend avoidance of tobacco products. Avoid excess alcohol.  Dispo: Follow-up in 6 months, or sooner as needed.  Signed, Jodelle Red, MD

## 2023-08-10 NOTE — Patient Instructions (Addendum)
 Medication Instructions:  Your physician recommends that you continue on your current medications as directed. Please refer to the Current Medication list given to you today.  Take lasix 20 mg once in the morning, and for a few days you can take a second dose in the early afternoon. Once you are back at your normal weight, go back to taking just once in the morning.  Follow-Up: At [facility], you and your health needs are our priority.  As part of our continuing mission to provide you with exceptional heart care, we have created designated Provider Care Teams.  These Care Teams include your primary Cardiologist (physician) and Advanced Practice Providers (APPs -  Physician Assistants and Nurse Practitioners) who all work together to provide you with the care you need, when you need it.  We recommend signing up for the patient portal called "MyChart".  Sign up information is provided on this After Visit Summary.  MyChart is used to connect with patients for Virtual Visits (Telemedicine).  Patients are able to view lab/test results, encounter notes, upcoming appointments, etc.  Non-urgent messages can be sent to your provider as well.   To learn more about what you can do with MyChart, go to [email or web address].    Your next appointment:   6 month(s)  Provider:   Jodelle Red, MD

## 2023-08-11 ENCOUNTER — Ambulatory Visit (INDEPENDENT_AMBULATORY_CARE_PROVIDER_SITE_OTHER): Payer: Medicare HMO

## 2023-08-11 ENCOUNTER — Encounter (HOSPITAL_BASED_OUTPATIENT_CLINIC_OR_DEPARTMENT_OTHER): Payer: Medicare HMO | Admitting: Internal Medicine

## 2023-08-11 DIAGNOSIS — I87332 Chronic venous hypertension (idiopathic) with ulcer and inflammation of left lower extremity: Secondary | ICD-10-CM

## 2023-08-11 DIAGNOSIS — L97328 Non-pressure chronic ulcer of left ankle with other specified severity: Secondary | ICD-10-CM

## 2023-08-11 DIAGNOSIS — E11621 Type 2 diabetes mellitus with foot ulcer: Secondary | ICD-10-CM

## 2023-08-11 DIAGNOSIS — E11622 Type 2 diabetes mellitus with other skin ulcer: Secondary | ICD-10-CM | POA: Diagnosis not present

## 2023-08-11 DIAGNOSIS — I428 Other cardiomyopathies: Secondary | ICD-10-CM

## 2023-08-12 LAB — CUP PACEART REMOTE DEVICE CHECK
Date Time Interrogation Session: 20250225142537
Implantable Lead Connection Status: 753985
Implantable Lead Implant Date: 20231127
Implantable Lead Location: 753860
Implantable Lead Model: 436909
Implantable Lead Serial Number: 81547891
Implantable Pulse Generator Implant Date: 20231127
Pulse Gen Model: 429525
Pulse Gen Serial Number: 84932826

## 2023-08-13 ENCOUNTER — Ambulatory Visit: Payer: Medicare HMO

## 2023-08-13 VITALS — Ht 73.0 in | Wt 254.0 lb

## 2023-08-13 DIAGNOSIS — Z0000 Encounter for general adult medical examination without abnormal findings: Secondary | ICD-10-CM

## 2023-08-13 DIAGNOSIS — Z1211 Encounter for screening for malignant neoplasm of colon: Secondary | ICD-10-CM

## 2023-08-13 DIAGNOSIS — Z1212 Encounter for screening for malignant neoplasm of rectum: Secondary | ICD-10-CM

## 2023-08-13 NOTE — Patient Instructions (Signed)
 [name] , Thank you for taking time to come for your Medicare Wellness Visit. I appreciate your ongoing commitment to your health goals. Please review the following plan we discussed and let me know if I can assist you in the future.   Referrals/Orders/Follow-Ups/Clinician Recommendations: It was nice to talk with you today.  Please call [facility], at [phone] to schedule a visit.  You also need to call [facility], [phone], to schedule a colonoscopy.  Each day, aim for 6 glasses of water, plenty of protein in your diet and try to get up and walk/ stretch every hour for 5-10 minutes at a time.    This is a list of the screening recommended for you and due dates:  Health Maintenance  Topic Date Due   COVID-19 Vaccine (3 - Pfizer risk series) 11/04/2019   Medicare Annual Wellness Visit  06/25/2023   Complete foot exam   12/03/2023*   Yearly kidney function blood test for diabetes  10/08/2023   Hemoglobin A1C  12/03/2023   Eye exam for diabetics  01/20/2024   Colon Cancer Screening  05/09/2024   Yearly kidney health urinalysis for diabetes  06/03/2024   DTaP/Tdap/Td vaccine (3 - Td or Tdap) 10/11/2024   Pneumonia Vaccine  Completed   Flu Shot  Completed   Hepatitis C Screening  Completed   Zoster (Shingles) Vaccine  Completed   HPV Vaccine  Aged Out  *Topic was postponed. The date shown is not the original due date.    Advanced directives: (Copy Requested) Please bring a copy of your health care power of attorney and living will to the office to be added to your chart at your convenience.  Next Medicare Annual Wellness Visit scheduled for next year: Yes

## 2023-08-13 NOTE — Progress Notes (Signed)
 Subjective:   [name] is a 68 y.o. who presents for a Medicare Wellness preventive visit.  Visit Complete: Virtual I connected with  [name] on 08/13/23 by a audio enabled telemedicine application and verified that I am speaking with the correct person using two identifiers.  Patient Location: Home  Provider Location: Home Office  I discussed the limitations of evaluation and management by telemedicine. The patient expressed understanding and agreed to proceed.  Vital Signs: Because this visit was a virtual/telehealth visit, some criteria may be missing or patient reported. Any vitals not documented were not able to be obtained and vitals that have been documented are patient reported.   AWV Questionnaire: Yes: Patient Medicare AWV questionnaire was completed by the patient on 08/09/2023; I have confirmed that all information answered by patient is correct and no changes since this date.  Cardiac Risk Factors include: advanced age (>20men, >55 women);hypertension;male gender;Other (see comment);diabetes mellitus;dyslipidemia, Risk factor comments: Chronic systolic heart failure, A-Fib, Asthma, Sleep Apnea     Objective:    Today's Vitals   08/13/23 1127  Weight: 254 lb (115.2 kg)  Height: 6\' 1"  (1.854 m)   Body mass index is 33.51 kg/m.     08/13/2023   11:38 AM 05/12/2022    6:10 AM 11/01/2021    5:42 AM 06/20/2021   11:28 AM 10/18/2020    7:22 AM 08/29/2020   12:53 PM 08/03/2020    8:29 PM  Advanced Directives  Does Patient Have a Medical Advance Directive? Yes Yes No Yes No Yes Yes  Type of Estate agent of [place];Living will;Out of facility DNR (pink MOST or yellow form) Healthcare Power of [place];Living will  Living will;Healthcare Power of Attorney  Living will;Healthcare Power of [employer] Power of [place];Living will  Does patient want to make changes to medical advance directive?    No - Patient declined   No - Patient  declined  Copy of Healthcare Power of Attorney in Chart? No - copy requested No - copy requested  No - copy requested  No - copy requested No - copy requested  Would patient like information on creating a medical advance directive?   No - Patient declined        Current Medications (verified) Outpatient Encounter Medications as of 08/13/2023  Medication Sig   BD PEN NEEDLE NANO 2ND GEN 32G X 4 MM MISC USE 4 TIMES A DAY AS DIRECTED   carvedilol (COREG) 12.5 MG tablet TAKE 1.5 TABLETS (18.75 MG TOTAL) BY MOUTH 2 (TWO) TIMES DAILY WITH A MEAL.   cholecalciferol (VITAMIN D3) 25 MCG (1000 UNIT) tablet Take 1,000 Units by mouth at bedtime.   Continuous Blood Gluc Receiver (FREESTYLE LIBRE 14 DAY READER) DEVI USE AS DIRECTED   doxylamine, Sleep, (UNISOM) 25 MG tablet Take 50 mg by mouth at bedtime.   furosemide (LASIX) 20 MG tablet TAKE 1 TABLET BY MOUTH EVERY DAY   insulin aspart (NOVOLOG FLEXPEN) 100 UNIT/ML FlexPen INJECT 8-12 UNITS INTO THE SKIN 3 (THREE) TIMES DAILY WITH MEALS.   insulin degludec (TRESIBA FLEXTOUCH) 200 UNIT/ML FlexTouch Pen INJECT 28 UNITS INTO THE SKIN DAILY.   JARDIANCE 10 MG TABS tablet TAKE 1 TABLET BY MOUTH EVERY DAY   levothyroxine (SYNTHROID) 50 MCG tablet TAKE 1 TABLET BY MOUTH EVERY DAY BEFORE BREAKFAST   Multiple Vitamins-Minerals (MULTIVITAMIN WITH MINERALS) tablet Take 2 tablets by mouth at bedtime. Chewable   rosuvastatin (CRESTOR) 10 MG tablet TAKE 1 TABLET BY MOUTH  EVERY DAY   sacubitril-valsartan (ENTRESTO) 97-103 MG Take 1 tablet by mouth 2 (two) times daily. NEEDS FOLLOW UP APPOINTMENT FOR MORE REFILLS   Semaglutide,0.25 or 0.5MG /DOS, (OZEMPIC, 0.25 OR 0.5 MG/DOSE,) 2 MG/3ML SOPN INJECT 0.5 MG INTO THE SKIN ONE TIME PER WEEK   spironolactone (ALDACTONE) 25 MG tablet TAKE 1 TABLET (25 MG TOTAL) BY MOUTH DAILY.   SYMBICORT 80-4.5 MCG/ACT inhaler INHALE 2 PUFFS INTO THE LUNGS DAILY AS NEEDED (SHORTNESS OF BREATH).   vitamin C (ASCORBIC ACID) 500 MG tablet Take  500 mg by mouth in the morning.   warfarin (COUMADIN) 5 MG tablet Take 1 tablet to 1 and 1/2 tablets by mouth daily as directed by the coumadin clinic.   mometasone (NASONEX) 50 MCG/ACT nasal spray PLACE 2 SPRAYS INTO THE NOSE DAILY AS NEEDED (ALLERGIES OR RHINITIS).   No facility-administered encounter medications on file as of 08/13/2023.    Allergies (verified) Patient has no known allergies.   History: Past Medical History:  Diagnosis Date   Asthma    as a teenager - does not use an inhaler although pt said he has an albuteral inhaler   Atrial fibrillation (HCC)    persistant 02/2009   Atrial flutter (HCC)    s/p CTI ablation 08/06/10   Cataract    Chronic rhinitis    Colon polyp    Congestive heart failure (HCC)    Diverticulosis    colonoscopy 04/03/2009   Hyperlipidemia    Hypertension    Morbid obesity (HCC)    target weight = 219  for BMI < 30   Nonischemic cardiomyopathy (HCC)    tachycardia mediated   Seasonal allergies    Sleep apnea    original 2001 - wears C-PaP   Type 2 diabetes mellitus (HCC)    Past Surgical History:  Procedure Laterality Date   ATRIAL FIBRILLATION ABLATION N/A 12/14/2018   Procedure: ATRIAL FIBRILLATION ABLATION;  Surgeon: Hillis Range, MD;  Location: [facility] INVASIVE CV LAB;  Service: Cardiovascular;  Laterality: N/A;   ATRIAL FIBRILLATION ABLATION N/A 10/18/2020   Procedure: ATRIAL FIBRILLATION ABLATION;  Surgeon: Hillis Range, MD;  Location: [facility] INVASIVE CV LAB;  Service: Cardiovascular;  Laterality: N/A;   atrial flutter ablation  08/06/10   CARDIOVERSION N/A 07/05/2018   Procedure: CARDIOVERSION;  Surgeon: Jodelle Red, MD;  Location: [facility] ENDOSCOPY;  Service: Cardiovascular;  Laterality: N/A;   CARDIOVERSION N/A 08/08/2020   Procedure: CARDIOVERSION;  Surgeon: Laurey Morale, MD;  Location: [facility] ENDOSCOPY;  Service: Cardiovascular;  Laterality: N/A;   CARDIOVERSION N/A 08/29/2020   Procedure: CARDIOVERSION;  Surgeon: Dolores Patty, MD;  Location: [facility] ENDOSCOPY;  Service: Cardiovascular;  Laterality: N/A;   COLONOSCOPY     ICD IMPLANT N/A 05/12/2022   Procedure: ICD IMPLANT;  Surgeon: Marinus Maw, MD;  Location: [facility] INVASIVE CV LAB;  Service: Cardiovascular;  Laterality: N/A;   POLYPECTOMY     RIGHT HEART CATH N/A 08/06/2020   Procedure: RIGHT HEART CATH;  Surgeon: Laurey Morale, MD;  Location: [facility] INVASIVE CV LAB;  Service: Cardiovascular;  Laterality: N/A;   RIGHT/LEFT HEART CATH AND CORONARY ANGIOGRAPHY N/A 11/01/2021   Procedure: RIGHT/LEFT HEART CATH AND CORONARY ANGIOGRAPHY;  Surgeon: Laurey Morale, MD;  Location: [facility] INVASIVE CV LAB;  Service: Cardiovascular;  Laterality: N/A;   TEE WITHOUT CARDIOVERSION N/A 12/14/2018   Procedure: TRANSESOPHAGEAL ECHOCARDIOGRAM (TEE);  Surgeon: Hillis Range, MD;  Location: [facility] INVASIVE CV LAB;  Service: Cardiovascular;  Laterality: N/A;   TEE WITHOUT CARDIOVERSION  N/A 08/03/2020   Procedure: TRANSESOPHAGEAL ECHOCARDIOGRAM (TEE);  Surgeon: Thurmon Fair, MD;  Location: [facility] ENDOSCOPY;  Service: Cardiovascular;  Laterality: N/A;   TEE WITHOUT CARDIOVERSION N/A 08/08/2020   Procedure: TRANSESOPHAGEAL ECHOCARDIOGRAM (TEE);  Surgeon: Laurey Morale, MD;  Location: [facility] ENDOSCOPY;  Service: Cardiovascular;  Laterality: N/A;   TEE WITHOUT CARDIOVERSION N/A 10/18/2020   Procedure: TRANSESOPHAGEAL ECHOCARDIOGRAM (TEE);  Surgeon: Jake Bathe, MD;  Location: [facility] ENDOSCOPY;  Service: Cardiovascular;  Laterality: N/A;   Family History  Problem Relation Age of Onset   Hypertension Mother    Kidney failure Mother        Due to sepsis   COPD Father    Colon cancer Brother        dx in 67's   Esophageal cancer Neg Hx    Rectal cancer Neg Hx    Stomach cancer Neg Hx    Social History   Socioeconomic History   Marital status: Single    Spouse name: Not on file   Number of children: 0   Years of education: Not on file   Highest education level: Some college, no degree  Occupational  History   Occupation: RETIRED/Sales  Tobacco Use   Smoking status: Never   Smokeless tobacco: Never  Vaping Use   Vaping status: Never Used  Substance and Sexual Activity   Alcohol use: Not Currently    Alcohol/week: 2.0 standard drinks of alcohol    Types: 2 Cans of beer per week    Comment: occasionally   Drug use: No   Sexual activity: Not on file  Other Topics Concern   Not on file  Social History Narrative   Lives in [place] and works for [employer]   Social Drivers of Health   Financial Resource Strain: Low Risk  (08/09/2023)   Overall Financial Resource Strain (CARDIA)    Difficulty of Paying Living Expenses: Not hard at all  Food Insecurity: No Food Insecurity (08/09/2023)   Hunger Vital Sign    Worried About Running Out of Food in the Last Year: Never true    Ran Out of Food in the Last Year: Never true  Transportation Needs: No Transportation Needs (08/09/2023)   PRAPARE - Administrator, Civil Service (Medical): No    Lack of Transportation (Non-Medical): No  Physical Activity: Inactive (08/13/2023)   Exercise Vital Sign    Days of Exercise per Week: 0 days    Minutes of Exercise per Session: 0 min  Stress: No Stress Concern Present (08/09/2023)   [employer] of Occupational Health - Occupational Stress Questionnaire    Feeling of Stress : Not at all  Social Connections: Socially Isolated (08/09/2023)   Social Connection and Isolation Panel [NHANES]    Frequency of Communication with Friends and Family: Once a week    Frequency of Social Gatherings with Friends and Family: Three times a week    Attends Religious Services: Never    Active Member of Clubs or Organizations: No    Attends Engineer, structural: Not on file    Marital Status: Separated    Tobacco Counseling Counseling given: Not Answered    Clinical Intake:  Pre-visit preparation completed: Yes  Pain : No/denies pain     BMI - recorded: 34.57 Nutritional  Status: BMI > 30  Obese Nutritional Risks: None Diabetes: Yes CBG done?: Yes (113) CBG resulted in Enter/ Edit results?: No Did pt. bring in CBG monitor from home?: No  How often  do you need to have someone help you when you read instructions, pamphlets, or other written materials from your doctor or pharmacy?: 1 - Never  Interpreter Needed?: No  Information entered by :: Malachy Coleman, RMA   Activities of Daily Living     08/13/2023   11:28 AM  In your present state of health, do you have any difficulty performing the following activities:  Hearing? 0  Vision? 0  Difficulty concentrating or making decisions? 0  Walking or climbing stairs? 0  Dressing or bathing? 0  Doing errands, shopping? 0  Preparing Food and eating ? N  Using the Toilet? N  In the past six months, have you accidently leaked urine? N  Do you have problems with loss of bowel control? N  Managing your Medications? N  Managing your Finances? N  Housekeeping or managing your Housekeeping? N    Patient Care Team: Myrlene Broker, MD as PCP - General (Internal Medicine) Jodelle Red, MD as PCP - Cardiology (Cardiology) [facility])  Indicate any recent Medical Services you may have received from other than [facility] providers in the past year (date may be approximate).     Assessment:   This is a routine wellness examination for [place].  Hearing/Vision screen Hearing Screening - Comments:: Denies hearing difficulties   Vision Screening - Comments:: Wears eyeglasses   Goals Addressed               This Visit's Progress     Client understands the importance of follow-up with providers by attending scheduled visits (pt-stated)   On track     My goal is to lose weight.  I would like to be 245 pounds.  He is working on it       Depression Screen     08/13/2023   11:40 AM 11/24/2022    1:44 PM 06/24/2022    1:50 PM 06/20/2021   11:31 AM 05/16/2020   10:29 AM 05/16/2020    10:00 AM 03/08/2019    9:26 AM  PHQ 2/9 Scores  PHQ - 2 Score 0 0 0 0 0 0 0  PHQ- 9 Score 0 0 0  0      Fall Risk     08/13/2023   11:38 AM 12/10/2022    9:10 AM 11/24/2022    1:44 PM 06/24/2022    1:50 PM 06/20/2021   11:29 AM  Fall Risk   Falls in the past year? 0 0 0 0 0  Number falls in past yr: 0 0 0 0 0  Injury with Fall? 0 0 0 0 0  Risk for fall due to : No Fall Risks    No Fall Risks  Follow up Falls prevention discussed;Falls evaluation completed Falls evaluation completed Falls evaluation completed Falls evaluation completed Falls prevention discussed    MEDICARE RISK AT HOME:  Medicare Risk at Home Any stairs in or around the home?: Yes (above garage) If so, are there any without handrails?: Yes Home free of loose throw rugs in walkways, pet beds, electrical cords, etc?: Yes Adequate lighting in your home to reduce risk of falls?: Yes Life alert?: Yes Use of a cane, walker or w/c?: No Grab bars in the bathroom?: No Shower chair or bench in shower?: No Elevated toilet seat or a handicapped toilet?: Yes  TIMED UP AND GO:  Was the test performed?  No  Cognitive Function: 6CIT completed        08/13/2023   11:29  AM  6CIT Screen  What Year? 0 points  What month? 0 points  What time? 0 points  Count back from 20 0 points  Months in reverse 0 points  Repeat phrase 0 points  Total Score 0 points    Immunizations Immunization History  Administered Date(s) Administered   Fluad Quad(high Dose 65+) 04/10/2020, 05/16/2021, 04/29/2022   Influenza, Seasonal, Injecte, Preservative Fre 05/12/2017, 02/17/2023   Influenza,inj,Quad PF,6+ Mos 05/08/2014, 05/15/2016, 03/18/2018, 03/08/2019   Influenza-Unspecified 05/01/2015   PFIZER(Purple Top)SARS-COV-2 Vaccination 09/16/2019, 10/07/2019   Pneumococcal Conjugate-13 05/16/2020   Pneumococcal Polysaccharide-23 11/15/2006, 06/21/2021   Td 03/09/2009   Tdap 10/12/2014   Zoster Recombinant(Shingrix) 01/12/2018, 03/18/2018    Zoster, Live 09/17/2015    Screening Tests Health Maintenance  Topic Date Due   COVID-19 Vaccine (3 - Pfizer risk series) 11/04/2019   Medicare Annual Wellness (AWV)  06/25/2023   FOOT EXAM  12/03/2023 (Originally 05/21/2023)   Diabetic kidney evaluation - eGFR measurement  10/08/2023   HEMOGLOBIN A1C  12/03/2023   OPHTHALMOLOGY EXAM  01/20/2024   Colonoscopy  05/09/2024   Diabetic kidney evaluation - Urine ACR  06/03/2024   DTaP/Tdap/Td (3 - Td or Tdap) 10/11/2024   Pneumonia Vaccine 62+ Years old  Completed   INFLUENZA VACCINE  Completed   Hepatitis C Screening  Completed   Zoster Vaccines- Shingrix  Completed   HPV VACCINES  Aged Out    Health Maintenance  Health Maintenance Due  Topic Date Due   COVID-19 Vaccine (3 - Pfizer risk series) 11/04/2019   Medicare Annual Wellness (AWV)  06/25/2023   Health Maintenance Items Addressed: Referral sent to GI for colonoscopy, See Nurse Notes  Additional Screening:  Vision Screening: Recommended annual ophthalmology exams for early detection of glaucoma and other disorders of the eye.  Dental Screening: Recommended annual dental exams for proper oral hygiene  Community Resource Referral / Chronic Care Management: CRR required this visit?  No   CCM required this visit?  No     Plan:     I have personally reviewed and noted the following in the patient's chart:   Medical and social history Use of alcohol, tobacco or illicit drugs  Current medications and supplements including opioid prescriptions. Patient is not currently taking opioid prescriptions. Functional ability and status Nutritional status Physical activity Advanced directives List of other physicians Hospitalizations, surgeries, and ER visits in previous 12 months Vitals Screenings to include cognitive, depression, and falls Referrals and appointments  In addition, I have reviewed and discussed with patient certain preventive protocols, quality metrics,  and best practice recommendations. A written personalized care plan for preventive services as well as general preventive health recommendations were provided to patient.     Madalyn Legner L Shirel Mallis, CMA   08/13/2023   After Visit Summary: (MyChart) Due to this being a telephonic visit, the after visit summary with patients personalized plan was offered to patient via MyChart   Notes: Please refer to Routing Comments.

## 2023-08-16 ENCOUNTER — Encounter: Payer: Self-pay | Admitting: Internal Medicine

## 2023-08-18 ENCOUNTER — Encounter (HOSPITAL_BASED_OUTPATIENT_CLINIC_OR_DEPARTMENT_OTHER): Payer: Medicare HMO | Attending: Internal Medicine | Admitting: Internal Medicine

## 2023-08-18 DIAGNOSIS — E11621 Type 2 diabetes mellitus with foot ulcer: Secondary | ICD-10-CM | POA: Diagnosis not present

## 2023-08-18 DIAGNOSIS — E11622 Type 2 diabetes mellitus with other skin ulcer: Secondary | ICD-10-CM | POA: Insufficient documentation

## 2023-08-18 DIAGNOSIS — L97328 Non-pressure chronic ulcer of left ankle with other specified severity: Secondary | ICD-10-CM | POA: Insufficient documentation

## 2023-08-18 DIAGNOSIS — I87332 Chronic venous hypertension (idiopathic) with ulcer and inflammation of left lower extremity: Secondary | ICD-10-CM | POA: Insufficient documentation

## 2023-08-19 ENCOUNTER — Encounter: Payer: Self-pay | Admitting: Internal Medicine

## 2023-08-19 ENCOUNTER — Ambulatory Visit: Payer: Medicare HMO | Admitting: Internal Medicine

## 2023-08-19 VITALS — BP 126/84 | HR 60 | Temp 97.9°F | Ht 73.0 in | Wt 263.0 lb

## 2023-08-19 DIAGNOSIS — E785 Hyperlipidemia, unspecified: Secondary | ICD-10-CM

## 2023-08-19 DIAGNOSIS — E1169 Type 2 diabetes mellitus with other specified complication: Secondary | ICD-10-CM | POA: Diagnosis not present

## 2023-08-19 DIAGNOSIS — I5022 Chronic systolic (congestive) heart failure: Secondary | ICD-10-CM | POA: Diagnosis not present

## 2023-08-19 DIAGNOSIS — M6530 Trigger finger, unspecified finger: Secondary | ICD-10-CM | POA: Diagnosis not present

## 2023-08-19 DIAGNOSIS — E11622 Type 2 diabetes mellitus with other skin ulcer: Secondary | ICD-10-CM

## 2023-08-19 DIAGNOSIS — E039 Hypothyroidism, unspecified: Secondary | ICD-10-CM

## 2023-08-19 DIAGNOSIS — Z0000 Encounter for general adult medical examination without abnormal findings: Secondary | ICD-10-CM

## 2023-08-19 DIAGNOSIS — M65342 Trigger finger, left ring finger: Secondary | ICD-10-CM | POA: Insufficient documentation

## 2023-08-19 DIAGNOSIS — I10 Essential (primary) hypertension: Secondary | ICD-10-CM | POA: Diagnosis not present

## 2023-08-19 DIAGNOSIS — H6192 Disorder of left external ear, unspecified: Secondary | ICD-10-CM

## 2023-08-19 DIAGNOSIS — Z7985 Long-term (current) use of injectable non-insulin antidiabetic drugs: Secondary | ICD-10-CM

## 2023-08-19 DIAGNOSIS — L97209 Non-pressure chronic ulcer of unspecified calf with unspecified severity: Secondary | ICD-10-CM

## 2023-08-19 LAB — TSH: TSH: 2.09 u[IU]/mL (ref 0.35–5.50)

## 2023-08-19 LAB — CBC
HCT: 54.6 % — ABNORMAL HIGH (ref 39.0–52.0)
Hemoglobin: 17.9 g/dL — ABNORMAL HIGH (ref 13.0–17.0)
MCHC: 32.7 g/dL (ref 30.0–36.0)
MCV: 86.8 fl (ref 78.0–100.0)
Platelets: 178 10*3/uL (ref 150.0–400.0)
RBC: 6.29 Mil/uL — ABNORMAL HIGH (ref 4.22–5.81)
RDW: 15.5 % (ref 11.5–15.5)
WBC: 6.6 10*3/uL (ref 4.0–10.5)

## 2023-08-19 LAB — COMPREHENSIVE METABOLIC PANEL
ALT: 16 U/L (ref 0–53)
AST: 23 U/L (ref 0–37)
Albumin: 4.1 g/dL (ref 3.5–5.2)
Alkaline Phosphatase: 60 U/L (ref 39–117)
BUN: 29 mg/dL — ABNORMAL HIGH (ref 6–23)
CO2: 30 meq/L (ref 19–32)
Calcium: 9.8 mg/dL (ref 8.4–10.5)
Chloride: 100 meq/L (ref 96–112)
Creatinine, Ser: 1.43 mg/dL (ref 0.40–1.50)
GFR: 50.3 mL/min — ABNORMAL LOW (ref >60.00)
Glucose, Bld: 115 mg/dL — ABNORMAL HIGH (ref 70–99)
Potassium: 4.5 meq/L (ref 3.5–5.1)
Sodium: 136 meq/L (ref 135–145)
Total Bilirubin: 0.6 mg/dL (ref 0.2–1.2)
Total Protein: 7.3 g/dL (ref 6.0–8.3)

## 2023-08-19 LAB — LIPID PANEL
Cholesterol: 129 mg/dL (ref 0–200)
HDL: 37.6 mg/dL — ABNORMAL LOW (ref >39.00)
LDL Cholesterol: 46 mg/dL (ref 0–99)
NonHDL: 91.12
Total CHOL/HDL Ratio: 3
Triglycerides: 224 mg/dL — ABNORMAL HIGH (ref 0.0–149.0)
VLDL: 44.8 mg/dL — ABNORMAL HIGH (ref 0.0–40.0)

## 2023-08-19 LAB — T4, FREE: Free T4: 0.83 ng/dL (ref 0.60–1.60)

## 2023-08-19 MED ORDER — MOMETASONE FUROATE 50 MCG/ACT NA SUSP
2.0000 | Freq: Every day | NASAL | 3 refills | Status: AC | PRN
Start: 2023-08-19 — End: ?

## 2023-08-19 MED ORDER — LEVOTHYROXINE SODIUM 50 MCG PO TABS: 50.0000 ug | ORAL_TABLET | Freq: Every day | ORAL | 3 refills | Status: DC

## 2023-08-19 MED ORDER — BUDESONIDE-FORMOTEROL FUMARATE 80-4.5 MCG/ACT IN AERO: 2.0000 | INHALATION_SPRAY | Freq: Two times a day (BID) | RESPIRATORY_TRACT | 11 refills | Status: AC

## 2023-08-19 NOTE — Assessment & Plan Note (Signed)
Checking lipid panel and adjust as needed crestor.

## 2023-08-19 NOTE — Assessment & Plan Note (Signed)
Flu shot up to date. Pneumonia complete. Shingrix complete. Tetanus up to date. Colonoscopy up to date. Counseled about sun safety and mole surveillance. Counseled about the dangers of distracted driving. Given 10 year screening recommendations.

## 2023-08-19 NOTE — Assessment & Plan Note (Signed)
 BP at goal on entrestro and spironolactone and lasix and coreg. Checking CMP and adjust as needed.

## 2023-08-19 NOTE — Assessment & Plan Note (Signed)
 Has not heard from derm from August 2024 referral. Will follow up.

## 2023-08-19 NOTE — Progress Notes (Signed)
   Subjective:   Patient ID: [name], male    DOB: 12-23-1955, 68 y.o.   MRN: [id number]  HPI The patient is here for physical. Worsening left trigger finger.  PMH, [facility], social history reviewed and updated  Review of Systems  Constitutional: Negative.   HENT: Negative.    Eyes: Negative.   Respiratory:  Negative for cough, chest tightness and shortness of breath.   Cardiovascular:  Negative for chest pain, palpitations and leg swelling.  Gastrointestinal:  Negative for abdominal distention, abdominal pain, constipation, diarrhea, nausea and vomiting.  Musculoskeletal: Negative.   Skin:  Positive for wound.  Neurological: Negative.   Psychiatric/Behavioral: Negative.      Objective:  Physical Exam Constitutional:      Appearance: He is well-developed.  HENT:     Head: Normocephalic and atraumatic.  Cardiovascular:     Rate and Rhythm: Normal rate and regular rhythm.  Pulmonary:     Effort: Pulmonary effort is normal. No respiratory distress.     Breath sounds: Normal breath sounds. No wheezing or rales.  Abdominal:     General: Bowel sounds are normal. There is no distension.     Palpations: Abdomen is soft.     Tenderness: There is no abdominal tenderness. There is no rebound.  Musculoskeletal:     Cervical back: Normal range of motion.  Skin:    General: Skin is warm and dry.  Neurological:     Mental Status: He is alert and oriented to person, place, and time.     Coordination: Coordination normal.     Vitals:   08/19/23 0959  BP: 126/84  Pulse: 60  Temp: 97.9 F (36.6 C)  TempSrc: Oral  SpO2: 96%  Weight: 263 lb (119.3 kg)  Height: 6\' 1"  (1.854 m)    Assessment & Plan:

## 2023-08-19 NOTE — Assessment & Plan Note (Signed)
 Checking TSH and T4 adjust as needed levothyroxine 50 mcg daily.

## 2023-08-19 NOTE — Assessment & Plan Note (Signed)
 UP to date on labs and seeing endo. Stable treatment.

## 2023-08-19 NOTE — Assessment & Plan Note (Signed)
 Referral to hand surgery for treatment as this is worsening.

## 2023-08-19 NOTE — Patient Instructions (Signed)
 We will get you in for the finger and the dermatologist.

## 2023-08-19 NOTE — Assessment & Plan Note (Signed)
 Still seeing wound center and this is not healing well. Has good sugar control.

## 2023-08-19 NOTE — Assessment & Plan Note (Signed)
 Checking CMP and CBC. Adjust as needed.

## 2023-08-20 ENCOUNTER — Encounter: Payer: Self-pay | Admitting: Internal Medicine

## 2023-08-25 ENCOUNTER — Ambulatory Visit: Payer: Medicare HMO | Attending: Cardiovascular Disease

## 2023-08-25 ENCOUNTER — Encounter (HOSPITAL_BASED_OUTPATIENT_CLINIC_OR_DEPARTMENT_OTHER): Payer: Medicare HMO | Admitting: Internal Medicine

## 2023-08-25 DIAGNOSIS — I513 Intracardiac thrombosis, not elsewhere classified: Secondary | ICD-10-CM | POA: Diagnosis not present

## 2023-08-25 DIAGNOSIS — I87332 Chronic venous hypertension (idiopathic) with ulcer and inflammation of left lower extremity: Secondary | ICD-10-CM

## 2023-08-25 DIAGNOSIS — L97328 Non-pressure chronic ulcer of left ankle with other specified severity: Secondary | ICD-10-CM | POA: Diagnosis not present

## 2023-08-25 DIAGNOSIS — E11622 Type 2 diabetes mellitus with other skin ulcer: Secondary | ICD-10-CM | POA: Diagnosis not present

## 2023-08-25 DIAGNOSIS — E11621 Type 2 diabetes mellitus with foot ulcer: Secondary | ICD-10-CM | POA: Diagnosis not present

## 2023-08-25 DIAGNOSIS — I4891 Unspecified atrial fibrillation: Secondary | ICD-10-CM | POA: Diagnosis not present

## 2023-08-25 DIAGNOSIS — Z5181 Encounter for therapeutic drug level monitoring: Secondary | ICD-10-CM | POA: Diagnosis not present

## 2023-08-25 LAB — POCT INR: INR: 2.3 (ref 2.0–3.0)

## 2023-08-25 NOTE — Patient Instructions (Signed)
 Take 1.5 tablets today and then continue taking Warfarin 1 tablet daily except for 1.5 tablets on Monday, Wednesday, Friday, and Saturday.  Stay consistent with premier protein drinks (1 time daily)  Recheck INR in 3 weeks. Coumadin Clinic [phone]

## 2023-08-26 ENCOUNTER — Encounter: Payer: Self-pay | Admitting: Internal Medicine

## 2023-08-31 ENCOUNTER — Encounter: Payer: Self-pay | Admitting: Podiatry

## 2023-08-31 ENCOUNTER — Ambulatory Visit (INDEPENDENT_AMBULATORY_CARE_PROVIDER_SITE_OTHER)

## 2023-08-31 ENCOUNTER — Ambulatory Visit (INDEPENDENT_AMBULATORY_CARE_PROVIDER_SITE_OTHER): Admitting: Podiatry

## 2023-08-31 DIAGNOSIS — L97422 Non-pressure chronic ulcer of left heel and midfoot with fat layer exposed: Secondary | ICD-10-CM

## 2023-08-31 DIAGNOSIS — E11621 Type 2 diabetes mellitus with foot ulcer: Secondary | ICD-10-CM | POA: Diagnosis not present

## 2023-08-31 NOTE — Progress Notes (Unsigned)
 [name]

## 2023-09-01 ENCOUNTER — Encounter (HOSPITAL_BASED_OUTPATIENT_CLINIC_OR_DEPARTMENT_OTHER): Admitting: Internal Medicine

## 2023-09-01 DIAGNOSIS — L97328 Non-pressure chronic ulcer of left ankle with other specified severity: Secondary | ICD-10-CM | POA: Diagnosis not present

## 2023-09-01 DIAGNOSIS — I87332 Chronic venous hypertension (idiopathic) with ulcer and inflammation of left lower extremity: Secondary | ICD-10-CM

## 2023-09-01 DIAGNOSIS — E11621 Type 2 diabetes mellitus with foot ulcer: Secondary | ICD-10-CM | POA: Diagnosis not present

## 2023-09-01 DIAGNOSIS — E11622 Type 2 diabetes mellitus with other skin ulcer: Secondary | ICD-10-CM | POA: Diagnosis not present

## 2023-09-02 ENCOUNTER — Other Ambulatory Visit: Payer: Medicare HMO

## 2023-09-02 ENCOUNTER — Other Ambulatory Visit (HOSPITAL_COMMUNITY): Payer: Self-pay | Admitting: Internal Medicine

## 2023-09-02 ENCOUNTER — Ambulatory Visit: Payer: Medicare HMO | Admitting: Hematology

## 2023-09-02 ENCOUNTER — Ambulatory Visit (HOSPITAL_COMMUNITY)
Admission: RE | Admit: 2023-09-02 | Discharge: 2023-09-02 | Disposition: A | Discharge status: Home / Self Care | Source: Ambulatory Visit | Attending: Vascular Surgery | Admitting: Vascular Surgery

## 2023-09-02 DIAGNOSIS — I70299 Other atherosclerosis of native arteries of extremities, unspecified extremity: Secondary | ICD-10-CM

## 2023-09-02 DIAGNOSIS — S91302A Unspecified open wound, left foot, initial encounter: Secondary | ICD-10-CM | POA: Diagnosis not present

## 2023-09-03 LAB — VAS US ABI WITH/WO TBI

## 2023-09-08 ENCOUNTER — Encounter (HOSPITAL_BASED_OUTPATIENT_CLINIC_OR_DEPARTMENT_OTHER): Admitting: Internal Medicine

## 2023-09-08 DIAGNOSIS — E11621 Type 2 diabetes mellitus with foot ulcer: Secondary | ICD-10-CM

## 2023-09-08 DIAGNOSIS — E11622 Type 2 diabetes mellitus with other skin ulcer: Secondary | ICD-10-CM

## 2023-09-08 DIAGNOSIS — L97328 Non-pressure chronic ulcer of left ankle with other specified severity: Secondary | ICD-10-CM | POA: Diagnosis not present

## 2023-09-08 DIAGNOSIS — I87332 Chronic venous hypertension (idiopathic) with ulcer and inflammation of left lower extremity: Secondary | ICD-10-CM

## 2023-09-10 ENCOUNTER — Ambulatory Visit (INDEPENDENT_AMBULATORY_CARE_PROVIDER_SITE_OTHER): Admitting: Podiatry

## 2023-09-10 ENCOUNTER — Encounter: Payer: Self-pay | Admitting: Podiatry

## 2023-09-10 DIAGNOSIS — E11621 Type 2 diabetes mellitus with foot ulcer: Secondary | ICD-10-CM

## 2023-09-10 DIAGNOSIS — L97422 Non-pressure chronic ulcer of left heel and midfoot with fat layer exposed: Secondary | ICD-10-CM

## 2023-09-10 NOTE — Patient Instructions (Signed)
 Pre-Operative Instructions  Congratulations, you have decided to take an important step to improving your quality of life.  You can be assured that the doctors of [facility] will be with you every step of the way.  Plan to be at the surgery center/hospital at least 1 (one) hour prior to your scheduled time unless otherwise directed by the surgical center/hospital staff.  You must have a responsible adult accompany you, remain during the surgery and drive you home.  Make sure you have directions to the surgical center/hospital and know how to get there on time. For hospital based surgery you will need to obtain a history and physical form from your family physician within 1 month prior to the date of surgery- we will give you a form for you primary physician.  We make every effort to accommodate the date you request for surgery.  There are however, times where surgery dates or times have to be moved.  We will contact you as soon as possible if a change in schedule is required.   No Aspirin/Ibuprofen for one week before surgery.  If you are on aspirin, any non-steroidal anti-inflammatory medications (Mobic, Aleve, Ibuprofen) you should stop taking it 7 days prior to your surgery.  You make take Tylenol  For pain prior to surgery.  Medications- If you are taking daily heart and blood pressure medications, seizure, reflux, allergy, asthma, anxiety, pain or diabetes medications, make sure the surgery center/hospital is aware before the day of surgery so they may notify you which medications to take or avoid the day of surgery. No food or drink after midnight the night before surgery unless directed otherwise by surgical center/hospital staff. No alcoholic beverages 24 hours prior to surgery.  No smoking 24 hours prior to or 24 hours after surgery. Wear loose pants or shorts- loose enough to fit over bandages, boots, and casts. No slip on shoes, sneakers are best. Bring your boot with you to the  surgery center/hospital.  Also bring crutches or a walker if your physician has prescribed it for you.  If you do not have this equipment, it will be provided for you after surgery. If you have not been contracted by the surgery center/hospital by the day before your surgery, call to confirm the date and time of your surgery. Leave-time from work may vary depending on the type of surgery you have.  Appropriate arrangements should be made prior to surgery with your employer. Prescriptions will be provided immediately following surgery by your doctor.  Have these filled as soon as possible after surgery and take the medication as directed. Remove nail polish on the operative foot. Wash the night before surgery.  The night before surgery wash the foot and leg well with the antibacterial soap provided and water paying special attention to beneath the toenails and in between the toes.  Rinse thoroughly with water and dry well with a towel.  Perform this wash unless told not to do so by your physician.  Enclosed: 1 Ice pack (please put in freezer the night before surgery)   1 Hibiclens skin cleaner   Pre-op Instructions  If you have any questions regarding the instructions, do not hesitate to call our office at any point during this process.   [place]: [address] [place], [place] [address] [phone]  [place]: [address] [place], [place] [address] [phone]  Dr. Ovid Curd, DPM

## 2023-09-11 ENCOUNTER — Encounter: Payer: Self-pay | Admitting: Internal Medicine

## 2023-09-11 ENCOUNTER — Telehealth: Payer: Self-pay

## 2023-09-11 ENCOUNTER — Encounter (HOSPITAL_BASED_OUTPATIENT_CLINIC_OR_DEPARTMENT_OTHER): Payer: Self-pay

## 2023-09-11 NOTE — Telephone Encounter (Signed)
 Copied from CRM [id number]. Topic: Clinical - Medical Advice >> Sep 11, 2023 11:42 AM Almira Coaster wrote: Reason for CRM: Patient is calling because he is having surgery on 09/23/2023 with Dr.Wagnoer and they informed patient needed to have an appointment with Dr.Crawford prior they also sent over a clearance form to the office. He would like to know if he needs an appointment or will Dr.Crawford just fill out the clearance form.

## 2023-09-11 NOTE — Telephone Encounter (Signed)
 FYI

## 2023-09-11 NOTE — Telephone Encounter (Signed)
**Note De-identified  [place] Obfuscation** Please advise 

## 2023-09-13 NOTE — Progress Notes (Signed)
 Subjective:  Patient ID: [name], male    DOB: 01/29/1956,  MRN: [id number]  Chief Complaint  Patient presents with   Wound Check    RM#11 Left foot wound follow up.      History of Present Illness          [name] is a 68 year old male with a history of diabetic ulcers who presents with a non-healing wound.  She presents today for further discussion regards to the graft application.  We are still waiting authorization from insurance for an office graft application.  He has been dealing with a non-healing wound since September 2024. Initially, a small spot was noticed that gradually enlarged. The wound has been wrapped since September, and while some areas have healed, a small spot remains on his back. No signs of infection in the wound, and no fever or other systemic symptoms.  He has a history of diabetic ulcers dating back to 2020 and 2021. During that time, he was treated at the wound center. The ulcers were classified as internal diabetic ulcers, and he was tested for MRSA and other infections, which were negative. In 2020, he received an artificial skin graft approved by [facility], which was successful in healing three ulcerated areas. A recent request for a similar graft for the current wound was denied by [facility]. He is currently managing the wound with dressings, but healing has been slow.  He does not smoke.     Objective:    Physical Exam         General: AAO x3, NAD  Dermatological: As pictured below there is a granular ulceration present with hyperkeratotic periwound.  The wound measures 1.2 x 0.6 x 0.4 cm without any probing, undermining or tunneling.  There is no exposed tendon.  There is no surrounding erythema, ascending cellulitis.  No fluctuation or crepitation.  There is no malodor.  Wound is the same in size.  Vascular: Dorsalis Pedis artery and Posterior Tibial artery pedal pulses bilateral with immedate capillary fill time. There is no pain with  calf compression, swelling, warmth, erythema.   Neruologic: Sensation decreased with SWMF.   Musculoskeletal: No pain on exam.        Results          Assessment:   1. Diabetic ulcer of left heel associated with type 2 diabetes mellitus, with fat layer exposed (HCC)      Plan:  Patient was evaluated and treated and all questions answered.  Assessment and Plan           Diabetic Foot Ulcer Chronic ulcer with poor healing  -We discussed the conservative as well as surgical management of the wound.  Facility is that the left applied in the office but still insurance authorization.  Not approved we will likely plan on doing this in the operating room under sedation in the next 1 to 2 weeks.  We discussed the procedure as well as postoperative course.  Discussed wound debridement, synthetic graft application.   -The incision placement as well as the postoperative course was discussed with the patient. I discussed risks of the surgery which include, but not limited to, infection, bleeding, pain, swelling, need for further surgery, delayed or nonhealing, painful or ugly scar, numbness or sensation changes, recurrence,  further deformity,  DVT/PE, loss of toe/foot, leg. Patient understands these risks and wishes to proceed with surgery. The surgical consent was reviewed with the patient all 3 pages were signed. No promises  or guarantees were given to the outcome of the procedure. All questions were answered to the best of my ability. Before the surgery the patient was encouraged to call the office if there is any further questions. The surgery will be performed at the hospital on an outpatient basis.  Vivi Barrack DPM

## 2023-09-14 NOTE — Telephone Encounter (Signed)
 I don't have a form for him but happy to review once I get it and advise. I will be out wed-fri this week so likely we need to get this form today or tomorrow to review

## 2023-09-14 NOTE — Progress Notes (Signed)
 Surgery orders requested via Epic inbox.

## 2023-09-15 ENCOUNTER — Encounter (HOSPITAL_BASED_OUTPATIENT_CLINIC_OR_DEPARTMENT_OTHER): Attending: Internal Medicine | Admitting: Internal Medicine

## 2023-09-15 ENCOUNTER — Telehealth: Payer: Self-pay

## 2023-09-15 DIAGNOSIS — I87332 Chronic venous hypertension (idiopathic) with ulcer and inflammation of left lower extremity: Secondary | ICD-10-CM

## 2023-09-15 DIAGNOSIS — L97328 Non-pressure chronic ulcer of left ankle with other specified severity: Secondary | ICD-10-CM | POA: Diagnosis not present

## 2023-09-15 DIAGNOSIS — E11622 Type 2 diabetes mellitus with other skin ulcer: Secondary | ICD-10-CM

## 2023-09-15 DIAGNOSIS — E11621 Type 2 diabetes mellitus with foot ulcer: Secondary | ICD-10-CM | POA: Diagnosis not present

## 2023-09-15 NOTE — Telephone Encounter (Signed)
 Spoke to Abby at Dr Frutoso Chase office to let her know the H & P form has been faxed to number provided.

## 2023-09-15 NOTE — Telephone Encounter (Signed)
 This has been signed and fax back over

## 2023-09-15 NOTE — Telephone Encounter (Signed)
 Patient left a message on the nurse line -asking to speak to Morrie Sheldon C about upcoming surgery on 09/23/23 - said something about some forms? Please call

## 2023-09-15 NOTE — Telephone Encounter (Signed)
Received and placed inside office box

## 2023-09-16 ENCOUNTER — Telehealth: Payer: Self-pay | Admitting: Podiatry

## 2023-09-16 ENCOUNTER — Encounter: Payer: Self-pay | Admitting: Orthopaedic Surgery

## 2023-09-16 ENCOUNTER — Ambulatory Visit (INDEPENDENT_AMBULATORY_CARE_PROVIDER_SITE_OTHER): Admitting: Orthopaedic Surgery

## 2023-09-16 DIAGNOSIS — M65342 Trigger finger, left ring finger: Secondary | ICD-10-CM | POA: Diagnosis not present

## 2023-09-16 MED ORDER — METHYLPREDNISOLONE ACETATE 40 MG/ML IJ SUSP
13.3300 mg | INTRAMUSCULAR | Status: AC | PRN
  Administered 2023-09-16: 13.33 mg

## 2023-09-16 MED ORDER — LIDOCAINE HCL 1 % IJ SOLN
0.3000 mL | INTRAMUSCULAR | Status: AC | PRN
  Administered 2023-09-16: .3 mL

## 2023-09-16 MED ORDER — BUPIVACAINE HCL 0.5 % IJ SOLN
0.3300 mL | INTRAMUSCULAR | Status: AC | PRN
  Administered 2023-09-16: .33 mL

## 2023-09-16 NOTE — Progress Notes (Signed)
 Office Visit Note   Patient: [name]           Date of Birth: [birth year]           MRN: [id number] Visit Date: 09/16/2023              Requested by: Myrlene Broker, MD [address] [place],  [place] [address] PCP: Myrlene Broker, MD   Assessment & Plan: Visit Diagnoses:  1. Trigger finger, left ring finger     Plan: Impression is left ring trigger finger.  Treatment options were explained.  Cortisone injection performed which he tolerated well.  Also recommend nighttime splinting for a couple weeks.  I will see him back as needed.  Follow-Up Instructions: No follow-ups on file.   Orders:  Orders Placed This Encounter  Procedures   Hand/UE Inj: L ring A1   No orders of the defined types were placed in this encounter.     Procedures: Hand/UE Inj: L ring A1 for trigger finger on 09/16/2023 10:50 AM Indications: pain Details: 25 G needle Medications: 0.3 mL lidocaine 1 %; 0.33 mL bupivacaine 0.5 %; 13.33 mg methylPREDNISolone acetate 40 MG/ML Outcome: tolerated well, no immediate complications Consent was given by the patient. Patient was prepped and draped in the usual sterile fashion.       Clinical Data: No additional findings.   Subjective: Chief Complaint  Patient presents with   Left Hand - Pain    HPI [name] is a very pleasant 68 year old gentleman here for left ring trigger finger.  Has trouble making a fist.  Right-hand-dominant.  He is retired.  Feels soreness and locking. Review of Systems  Constitutional: Negative.   HENT: Negative.    Eyes: Negative.   Respiratory: Negative.    Cardiovascular: Negative.   Gastrointestinal: Negative.   Endocrine: Negative.   Genitourinary: Negative.   Skin: Negative.   Allergic/Immunologic: Negative.   Neurological: Negative.   Hematological: Negative.   Psychiatric/Behavioral: Negative.    All other systems reviewed and are negative.    Objective: Vital Signs: There were no  vitals taken for this visit.  Physical Exam Vitals and nursing note reviewed.  Constitutional:      Appearance: He is well-developed.  HENT:     Head: Normocephalic and atraumatic.  Eyes:     Pupils: Pupils are equal, round, and reactive to light.  Pulmonary:     Effort: Pulmonary effort is normal.  Abdominal:     Palpations: Abdomen is soft.  Musculoskeletal:        General: Normal range of motion.     Cervical back: Neck supple.  Skin:    General: Skin is warm.  Neurological:     Mental Status: He is alert and oriented to person, place, and time.  Psychiatric:        Behavior: Behavior normal.        Thought Content: Thought content normal.        Judgment: Judgment normal.     Ortho Exam Exam of the left hand shows tender A1 pulley of the ring finger.  He has full arc of motion.  He can make a full composite fist. Specialty Comments:  No specialty comments available.  Imaging: No results found.   PMFS History: Patient Active Problem List   Diagnosis Date Noted   Trigger finger, left ring finger 08/19/2023   Skin lesion of left ear 02/13/2023   ICD (implantable cardioverter-defibrillator) in place 08/14/2022   Thrombus  of left atrial appendage 08/13/2020   Right hand pain 07/06/2020   Bradycardia 09/03/2019   NICM (nonischemic cardiomyopathy) (HCC) 03/30/2019   Diabetic ulcer of calf (HCC) 05/20/2018   Type 2 diabetes mellitus with hyperlipidemia (HCC) 09/09/2016   History of colon polyps 05/17/2014   Routine general medical examination at a health care facility 05/08/2014   Hypothyroidism 03/22/2010   Chronic systolic heart failure (HCC) 03/28/2009   ATRIAL FIBRILLATION 03/09/2009   Asthma 01/25/2008   Hyperlipidemia associated with type 2 diabetes mellitus (HCC) 01/24/2008   Essential hypertension 01/24/2008   Sleep apnea 01/24/2008   Past Medical History:  Diagnosis Date   Allergy    Asthma    as a teenager - does not use an inhaler although pt said  he has an albuteral inhaler   Atrial fibrillation (HCC)    persistant 02/2009   Atrial flutter (HCC)    s/p CTI ablation 08/06/10   Cataract    Chronic rhinitis    Colon polyp    Congestive heart failure (HCC)    Diverticulosis    colonoscopy 04/03/2009   Hyperlipidemia    Hypertension    Morbid obesity (HCC)    target weight = 219  for BMI < 30   Nonischemic cardiomyopathy (HCC)    tachycardia mediated   Seasonal allergies    Sleep apnea    original 2001 - wears C-PaP   Type 2 diabetes mellitus (HCC)     Family History  Problem Relation Age of Onset   Hypertension Mother    Kidney failure Mother        Due to sepsis   COPD Father    Colon cancer Brother        dx in 87's   Esophageal cancer Neg Hx    Rectal cancer Neg Hx    Stomach cancer Neg Hx     Past Surgical History:  Procedure Laterality Date   ATRIAL FIBRILLATION ABLATION N/A 12/14/2018   Procedure: ATRIAL FIBRILLATION ABLATION;  Surgeon: Hillis Range, MD;  Location: [facility] INVASIVE CV LAB;  Service: Cardiovascular;  Laterality: N/A;   ATRIAL FIBRILLATION ABLATION N/A 10/18/2020   Procedure: ATRIAL FIBRILLATION ABLATION;  Surgeon: Hillis Range, MD;  Location: [facility] INVASIVE CV LAB;  Service: Cardiovascular;  Laterality: N/A;   atrial flutter ablation  08/06/10   CARDIOVERSION N/A 07/05/2018   Procedure: CARDIOVERSION;  Surgeon: Jodelle Red, MD;  Location: [facility] ENDOSCOPY;  Service: Cardiovascular;  Laterality: N/A;   CARDIOVERSION N/A 08/08/2020   Procedure: CARDIOVERSION;  Surgeon: Laurey Morale, MD;  Location: [facility] ENDOSCOPY;  Service: Cardiovascular;  Laterality: N/A;   CARDIOVERSION N/A 08/29/2020   Procedure: CARDIOVERSION;  Surgeon: Dolores Patty, MD;  Location: [facility] ENDOSCOPY;  Service: Cardiovascular;  Laterality: N/A;   COLONOSCOPY     ICD IMPLANT N/A 05/12/2022   Procedure: ICD IMPLANT;  Surgeon: Marinus Maw, MD;  Location: [facility] INVASIVE CV LAB;  Service: Cardiovascular;  Laterality: N/A;    POLYPECTOMY     RIGHT HEART CATH N/A 08/06/2020   Procedure: RIGHT HEART CATH;  Surgeon: Laurey Morale, MD;  Location: [facility] INVASIVE CV LAB;  Service: Cardiovascular;  Laterality: N/A;   RIGHT/LEFT HEART CATH AND CORONARY ANGIOGRAPHY N/A 11/01/2021   Procedure: RIGHT/LEFT HEART CATH AND CORONARY ANGIOGRAPHY;  Surgeon: Laurey Morale, MD;  Location: [facility] INVASIVE CV LAB;  Service: Cardiovascular;  Laterality: N/A;   TEE WITHOUT CARDIOVERSION N/A 12/14/2018   Procedure: TRANSESOPHAGEAL ECHOCARDIOGRAM (TEE);  Surgeon: Hillis Range, MD;  Location: [facility]  INVASIVE CV LAB;  Service: Cardiovascular;  Laterality: N/A;   TEE WITHOUT CARDIOVERSION N/A 08/03/2020   Procedure: TRANSESOPHAGEAL ECHOCARDIOGRAM (TEE);  Surgeon: Thurmon Fair, MD;  Location: [facility] ENDOSCOPY;  Service: Cardiovascular;  Laterality: N/A;   TEE WITHOUT CARDIOVERSION N/A 08/08/2020   Procedure: TRANSESOPHAGEAL ECHOCARDIOGRAM (TEE);  Surgeon: Laurey Morale, MD;  Location: [facility] ENDOSCOPY;  Service: Cardiovascular;  Laterality: N/A;   TEE WITHOUT CARDIOVERSION N/A 10/18/2020   Procedure: TRANSESOPHAGEAL ECHOCARDIOGRAM (TEE);  Surgeon: Jake Bathe, MD;  Location: [facility] ENDOSCOPY;  Service: Cardiovascular;  Laterality: N/A;   Social History   Occupational History   Occupation: RETIRED/Sales  Tobacco Use   Smoking status: Never   Smokeless tobacco: Never  Vaping Use   Vaping status: Never Used  Substance and Sexual Activity   Alcohol use: Not Currently    Alcohol/week: 2.0 standard drinks of alcohol    Comment: occasionally   Drug use: No   Sexual activity: Yes

## 2023-09-16 NOTE — Addendum Note (Signed)
 Addended by: Elease Etienne A on: 09/16/2023 03:34 PM   Modules accepted: Orders

## 2023-09-16 NOTE — Progress Notes (Signed)
 Remote ICD transmission.

## 2023-09-16 NOTE — Telephone Encounter (Signed)
 Copied from CRM [id number]. Topic: Clinical - Medication Question >> Sep 16, 2023  8:29 AM Sonny Dandy B wrote: Reason for CRM: pt called ot speak with nurse, regarding pre op forms please call [phone] Pt surgery is scheduled for next week, pt needs the forms completed and sent back. Pt scheduled a clearance 4/8

## 2023-09-16 NOTE — Telephone Encounter (Signed)
 DOS: 09-23-2023  GRAFT APPLICATION (15275) WOUND BED PREP (15003)     EFFECTIVE DATE:  06/16/2021   DEDUCTIBLE:    REMAINING:   OOP:  $6,750.00  REMAINING:  $6,472.78  CO INSURANCE : 0%   PER THE [place] WEB SITE NO PRIOR AUTH IS REQ FOR CPT CODE 14782, 15003   09/23/2023 - 12/22/2023  REF# [id number]

## 2023-09-17 ENCOUNTER — Telehealth: Payer: Self-pay | Admitting: Podiatry

## 2023-09-17 ENCOUNTER — Ambulatory Visit: Attending: Cardiology | Admitting: *Deleted

## 2023-09-17 DIAGNOSIS — I4891 Unspecified atrial fibrillation: Secondary | ICD-10-CM | POA: Diagnosis not present

## 2023-09-17 DIAGNOSIS — Z5181 Encounter for therapeutic drug level monitoring: Secondary | ICD-10-CM

## 2023-09-17 LAB — POCT INR: POC INR: 3.4

## 2023-09-17 NOTE — Patient Instructions (Signed)
 SURGICAL WAITING ROOM VISITATION Patients having surgery or a procedure may have no more than 2 support people in the waiting area - these visitors may rotate in the visitor waiting room.   If the patient needs to stay at the hospital during part of their recovery, the visitor guidelines for inpatient rooms apply.  PRE-OP VISITATION  Pre-op nurse will coordinate an appropriate time for 1 support person to accompany the patient in pre-op.  This support person may not rotate.  This visitor will be contacted when the time is appropriate for the visitor to come back in the pre-op area.  Please refer to the [facility] website for the visitor guidelines for Inpatients (after your surgery is over and you are in a regular room).  You are not required to quarantine at this time prior to your surgery. However, you must do this: Hand Hygiene often Do NOT share personal items Notify your provider if you are in close contact with someone who has COVID or you develop fever 100.4 or greater, new onset of sneezing, cough, sore throat, shortness of breath or body aches.  If you test positive for Covid or have been in contact with anyone that has tested positive in the last 10 days please notify you surgeon.    Your procedure is scheduled on:  [facility]  September 23, 2023  Report to [facility] Main Entrance: [address]sen entrance where the [employer] is available.   Report to admitting at: 06:15    AM  Call this number if you have any questions or problems the morning of surgery [phone]  DO NOT EAT OR DRINK ANYTHING AFTER MIDNIGHT THE NIGHT PRIOR TO YOUR SURGERY / PROCEDURE.   FOLLOW  ANY ADDITIONAL PRE OP INSTRUCTIONS YOU RECEIVED FROM YOUR SURGEON'S OFFICE!!!   Oral Hygiene is also important to reduce your risk of infection.        Remember - BRUSH YOUR TEETH THE MORNING OF SURGERY WITH YOUR REGULAR TOOTHPASTE  How to Manage Your Diabetes Before and After Surgery  Why is it important to  control my blood sugar before and after surgery? Improving blood sugar levels before and after surgery helps healing and can limit problems. A way of improving blood sugar control is eating a healthy diet by:  Eating less sugar and carbohydrates  Increasing activity/exercise  Talking with your doctor about reaching your blood sugar goals High blood sugars (greater than 180 mg/dL) can raise your risk of infections and slow your recovery, so you will need to focus on controlling your diabetes during the weeks before surgery. Make sure that the doctor who takes care of your diabetes knows about your planned surgery including the date and location.  How do I manage my blood sugar before surgery? Check your blood sugar at least 4 times a day, starting 2 days before surgery, to make sure that the level is not too high or low. Check your blood sugar the morning of your surgery when you wake up and every 2 hours until you get to the Short Stay unit. If your blood sugar is less than 70 mg/dL, you will need to treat for low blood sugar: Do not take insulin. Treat a low blood sugar (less than 70 mg/dL) with  cup of clear juice (cranberry or apple), 4 glucose tablets, OR glucose gel. Recheck blood sugar in 15 minutes after treatment (to make sure it is greater than 70 mg/dL). If your blood sugar is not greater than 70 mg/dL  on recheck, call [phone] for further instructions. Report your blood sugar to the short stay nurse when you get to Short Stay.  If you are admitted to the hospital after surgery: Your blood sugar will be checked by the staff and you will probably be given insulin after surgery (instead of oral diabetes medicines) to make sure you have good blood sugar levels. The goal for blood sugar control after surgery is 80-180 mg/dL.   WHAT DO I DO ABOUT MY DIABETES MEDICATION?  SEMAGLUTIDE- takes on Sundays  Last inject: 09-13-2023    JARDIANCE- Hold x 72 hours  last dose: 09-19-23  Saturday  INSULIN DEGLUDEC (TRESIBA) Day before surgery: 100% or 28 units if morning, 50% or 14 units if dinner dose.  DAY OF SURGERY: 50% dose or 14 units.   INSULIN ASPART-  Day Before surgery: take usual dose with meals, no bedtime.  DAY OF SURGERY: If your CBG is greater than 220 mg/dL, you may take  of your sliding scale (correction) dose of insulin.    IF you have any questions, call the nurse at [phone]   Do NOT smoke after Midnight the night before surgery.  STOP TAKING all Vitamins, Herbs and supplements 1 week before your surgery.   Take ONLY these medicines the morning of surgery with A SIP OF WATER: levothyroxine, carvedilol, and you may use your Symbicort inhaler if needed.    If You have been diagnosed with Sleep Apnea - Bring CPAP mask and tubing day of surgery. We will provide you with a CPAP machine on the day of your surgery.                   You may not have any metal on your body including  jewelry, and body piercing  Do not wear lotions, powders,  cologne, or deodorant  Men may shave face and neck.  Contacts, Hearing Aids, dentures or bridgework may not be worn into surgery. DENTURES WILL BE REMOVED PRIOR TO SURGERY PLEASE DO NOT APPLY "Poly grip" OR ADHESIVES!!!  Patients discharged on the day of surgery will not be allowed to drive home.  Someone NEEDS to stay with you for the first 24 hours after anesthesia.  Do not bring your home medications to the hospital. The Pharmacy will dispense medications listed on your medication list to you during your admission in the Hospital.  Please read over the following fact sheets you were given: IF YOU HAVE QUESTIONS ABOUT YOUR PRE-OP INSTRUCTIONS, PLEASE CALL [phone].   [place] - Preparing for Surgery Before surgery, you can play an important role.  Because skin is not sterile, your skin needs to be as free of germs as possible.  You can reduce the number of germs on your skin by washing with CHG  (chlorahexidine gluconate) soap before surgery.  CHG is an antiseptic cleaner which kills germs and bonds with the skin to continue killing germs even after washing. Please DO NOT use if you have an allergy to CHG or antibacterial soaps.  If your skin becomes reddened/irritated stop using the CHG and inform your nurse when you arrive at Short Stay. Do not shave (including legs and underarms) for at least 48 hours prior to the first CHG shower.  You may shave your face/neck.  Please follow these instructions carefully:  1.  Shower with CHG Soap the night before surgery and the  morning of surgery.  2.  If you choose to wash your hair, wash your hair first  as usual with your normal  shampoo.  3.  After you shampoo, rinse your hair and body thoroughly to remove the shampoo.                             4.  Use CHG as you would any other liquid soap.  You can apply chg directly to the skin and wash.  Gently with a scrungie or clean washcloth.  5.  Apply the CHG Soap to your body ONLY FROM THE NECK DOWN.   Do not use on face/ open                           Wound or open sores. Avoid contact with eyes, ears mouth and genitals (private parts).                       Wash face,  Genitals (private parts) with your normal soap.             6.  Wash thoroughly, paying special attention to the area where your  surgery  will be performed.  7.  Thoroughly rinse your body with warm water from the neck down.  8.  DO NOT shower/wash with your normal soap after using and rinsing off the CHG Soap.            9.  Pat yourself dry with a clean towel.            10.  Wear clean pajamas.            11.  Place clean sheets on your bed the night of your first shower and do not  sleep with pets.  ON THE DAY OF SURGERY : Do not apply any lotions/deodorants the morning of surgery.  Please wear clean clothes to the hospital/surgery center.    FAILURE TO FOLLOW THESE INSTRUCTIONS MAY RESULT IN THE CANCELLATION OF YOUR  SURGERY  PATIENT SIGNATURE_________________________________  NURSE SIGNATURE__________________________________  ________________________________________________________________________

## 2023-09-17 NOTE — Telephone Encounter (Signed)
error 

## 2023-09-17 NOTE — Telephone Encounter (Signed)
 Called pt to let him know he needed to stop the ozempic a wk before his surgery ( he takes it on Sundays) and jardiance needs to be stopped 3 days before surgery.   I emailed pt the pre op instructions and post op instructions as well. He stated his leg is totally wrapped up from wound care.

## 2023-09-17 NOTE — Patient Instructions (Signed)
 Description   Take 1/2 a tablet of warfarin today and then continue taking Warfarin 1 tablet daily except for 1.5 tablets on Monday, Wednesday, Friday, and Saturday.  Stay consistent with premier protein drinks (1 time daily)  Recheck INR 2 weeks.  Coumadin [facility] [phone]

## 2023-09-17 NOTE — Progress Notes (Addendum)
 COVID Vaccine received:  []  No [x]  Yes Date of any COVID positive Test in last 90 days:  PCP - Hillard Danker, MD  Cardiologist - Jodelle Red, MD  [facility] Clinic- Marca Ancona, MD   Chest x-ray - 05-12-2022  2v  Epic EKG -  01-30-2023  Epic Stress Test -  ECHO - 05-29-2022  Epic Cardiac Cath - 11-01-2021  [facility] by Dr. Shirlee Latch  PCR screen: []  Ordered & Completed []   No Order but Needs PROFEND     [x]   N/A for this surgery  Surgery Plan:  [x]  Ambulatory   []  Outpatient in bed  []  Admit Anesthesia:    []  General  []  Spinal  [x]   Choice []   MAC  Pacemaker / ICD device []  No [x]  Yes  Implanted 05-12-2022 Biotronik Acticor ICD   Left chest   Last checked: 08-11-2023   Device orders requested.  Spinal Cord Stimulator:[x]  No []  Yes       History of Sleep Apnea? []  No [x]  Yes   CPAP used?- []  No []  Yes    Does the patient monitor blood sugar?   []  N/A   []  No []  Yes  Patient has: []  NO Hx DM   []  Pre-DM   []  DM1  [x]   DM2 Last A1c was: 6.0 on  06-04-2023      Does patient have a [employer] or Dexcom? []  No [x]  Yes   Fasting Blood Sugar Ranges-  Checks Blood Sugar continuous times a day  SEMAGLUTIDE- takes on Sundays  Last inject: 09-13-2023   JARDIANCE- Hold x 72 hours  last dose: 09-19-23 Saturday INSULIN DEGLUDEC (TRESIBA) Day before surgery: 100% or 28 units if morning, 50% or 14 units if dinner dose.  DAY OF SURGERY: 50% dose or 14 units.  INSULIN ASPART-  Day Before surgery: take usual dose with meals, no bedtime.  DAY OF SURGERY:   Blood Thinner / Instructions:  COUMADIN (CVD [facility]  [id number]) ?? Aspirin Instructions:  ERAS Protocol Ordered: [x]  No  []  Yes Patient is to be NPO after: midnight prior  Dental hx: []  Dentures:  []  N/A      []  Bridge or Partial:                   []  Loose or Damaged teeth:   Comments:   Activity level: Patient is able / unable to climb a flight of stairs without difficulty; []  No CP  []  No SOB, but would have  ___   Patient can / can not perform ADLs without assistance.   Anesthesia review: A.fib, DM2, OSA-CPAP, CM-CHF, asthma, Has Biotronik ICD in left chest  Patient denies shortness of breath, fever, cough and chest pain at PAT appointment.  Patient verbalized understanding and agreement to the Pre-Surgical Instructions that were given to them at this PAT appointment. Patient was also educated of the need to review these PAT instructions again prior to his surgery.I reviewed the appropriate phone numbers to call if they have any and questions or concerns.

## 2023-09-18 ENCOUNTER — Encounter (HOSPITAL_COMMUNITY)
Admission: RE | Admit: 2023-09-18 | Discharge: 2023-09-18 | Disposition: A | Discharge status: Home / Self Care | Source: Ambulatory Visit | Attending: Podiatry | Admitting: Podiatry

## 2023-09-18 ENCOUNTER — Encounter: Payer: Self-pay | Admitting: Internal Medicine

## 2023-09-18 ENCOUNTER — Other Ambulatory Visit: Payer: Self-pay

## 2023-09-18 ENCOUNTER — Telehealth: Payer: Self-pay

## 2023-09-18 ENCOUNTER — Telehealth: Payer: Self-pay | Admitting: Cardiology

## 2023-09-18 ENCOUNTER — Encounter: Payer: Self-pay | Admitting: Podiatry

## 2023-09-18 ENCOUNTER — Telehealth: Payer: Self-pay | Admitting: Podiatry

## 2023-09-18 DIAGNOSIS — Z01812 Encounter for preprocedural laboratory examination: Secondary | ICD-10-CM | POA: Diagnosis not present

## 2023-09-18 DIAGNOSIS — Z7901 Long term (current) use of anticoagulants: Secondary | ICD-10-CM | POA: Insufficient documentation

## 2023-09-18 DIAGNOSIS — E785 Hyperlipidemia, unspecified: Secondary | ICD-10-CM | POA: Diagnosis not present

## 2023-09-18 DIAGNOSIS — E1169 Type 2 diabetes mellitus with other specified complication: Secondary | ICD-10-CM | POA: Insufficient documentation

## 2023-09-18 DIAGNOSIS — Z01818 Encounter for other preprocedural examination: Secondary | ICD-10-CM | POA: Diagnosis present

## 2023-09-18 DIAGNOSIS — I428 Other cardiomyopathies: Secondary | ICD-10-CM | POA: Diagnosis not present

## 2023-09-18 LAB — BASIC METABOLIC PANEL WITH GFR
Anion gap: 8 (ref 5–15)
BUN: 18 mg/dL (ref 8–23)
CO2: 26 mmol/L (ref 22–32)
Calcium: 9.3 mg/dL (ref 8.9–10.3)
Chloride: 105 mmol/L (ref 98–111)
Creatinine, Ser: 1.27 mg/dL — ABNORMAL HIGH (ref 0.61–1.24)
GFR, Estimated: >60 mL/min (ref >60)
Glucose, Bld: 104 mg/dL — ABNORMAL HIGH (ref 70–99)
Potassium: 4.1 mmol/L (ref 3.5–5.1)
Sodium: 139 mmol/L (ref 135–145)

## 2023-09-18 LAB — CBC
HCT: 58.7 % — ABNORMAL HIGH (ref 39.0–52.0)
Hemoglobin: 18.3 g/dL — ABNORMAL HIGH (ref 13.0–17.0)
MCH: 27.7 pg (ref 26.0–34.0)
MCHC: 31.2 g/dL (ref 30.0–36.0)
MCV: 88.9 fL (ref 80.0–100.0)
Platelets: 172 10*3/uL (ref 150–400)
RBC: 6.6 MIL/uL — ABNORMAL HIGH (ref 4.22–5.81)
RDW: 15.2 % (ref 11.5–15.5)
WBC: 7.7 10*3/uL (ref 4.0–10.5)
nRBC: 0 % (ref 0.0–0.2)

## 2023-09-18 MED ORDER — ENOXAPARIN SODIUM 120 MG/0.8ML IJ SOSY: 120.0000 mg | PREFILLED_SYRINGE | Freq: Two times a day (BID) | INTRAMUSCULAR | 1 refills | Status: DC

## 2023-09-18 NOTE — Telephone Encounter (Signed)
   Pre-operative Risk Assessment    Patient Name: [name]  DOB: 01/01/1956 MRN: [id number]   Date of last office visit: 08/10/23 Date of next office visit: 10/11/23   Request for Surgical Clearance    Procedure:   Wound Debridement with skin graft application  Date of Surgery:  Clearance TBD                                Surgeon:  Not listed Surgeon's Group or Practice Name:  [facility] Phone number:  [phone] Fax number:  [phone]   Type of Clearance Requested:   - Medical  - Pharmacy:  Hold Warfarin (Coumadin)     Type of Anesthesia:  General    Additional requests/questions:    Garrel Ridgel   09/18/2023, 2:54 PM

## 2023-09-18 NOTE — Telephone Encounter (Signed)
 Patient with diagnosis of afib and LAA on warfarin for anticoagulation.    Procedure: Wound debris, application of a graft  Date of procedure: 09/23/23   CHA2DS2-VASc Score = 4   This indicates a 4.8% annual risk of stroke. The patient's score is based upon: CHF History: 1 HTN History: 1 Diabetes History: 1 Stroke History: 0 Vascular Disease History: 0 Age Score: 1 Gender Score: 0      CrCl 75 ml/min Platelet count 172  Per office protocol, patient can hold warfarin for 5 days prior to procedure.    Patient WILL need bridging with Lovenox (enoxaparin) around procedure.  Bridge to be coordinated by coumadin clinic. Procedure will need to be rescheduled if bridge and clearance cannot be provided today. Would also need to confirm that he did not take warfarin today.  **This guidance is not considered finalized until pre-operative APP has relayed final recommendations.**

## 2023-09-18 NOTE — Telephone Encounter (Signed)
 Joyce Gross from pre register testing called back and Abigail at the coumadin clinic and they are needing a form sent over requesting pt to stop coumadin 2 or 3 days prior to surgery. He may need a bridge and if that is the case we would have to change his surgery. The fax number for the coumadin clinic is [phone]

## 2023-09-18 NOTE — Progress Notes (Signed)
 Patient was seen today for his PST interview (09-23-23- left heel wound debridement, Allograft application at [facility]). However, he has not been cleared by his PCP (has appt 09-22-23) and he may need to reschedule his surgery d/t needing a Lovenox bridge for his Coumadin. Spoke to [employer] at [employer] and she had, yesterday, originally told Revonda Standard at the [facility] to continue his Coumadin.   I called Dawn today and she said Dr. Ardelle Anton wanted at least a 2-3 day hold. She is going to fax the appropriate paperwork to the Coumadin Clinic (Fax [phone]). Abigail at the clinic will get it to the Pharmacist asap to see how many days pt will need bridging, etc. Patient has done lovenox injections in the past so he is familiar with the process.   Mr. Swamy is very upset about all of the confusion by Dr. Gabriel Rung office staff and stated he was going to try and contact Dr. Ardelle Anton about it. He say he will keep in contact with both myself and the Coumadin clinic.   We did go ahead and did blood work (CBC, BMP, A1C) so that if he is rescheduled, we can have a phone call PST interview. Mr. Bouch voiced understanding and agreement. These labs will be good for 30 days in the event his surgery is rescheduled. Also, Dr. Okey Dupre, his PCP, will have access to the labs at his clearance appt on 09-22-23

## 2023-09-18 NOTE — Telephone Encounter (Signed)
 Received a call from [facility] pre testing kay and they were asking if pt needed to stop his coumadin and per Dr Ardelle Anton he said he would like him to if they are ok with it.

## 2023-09-18 NOTE — Telephone Encounter (Signed)
 Duplicate request

## 2023-09-18 NOTE — Telephone Encounter (Signed)
 Received call from [facility] at [facility] pre testing concerning pt not instructed to hold Warfarin for upcoming procedure on 09/23/23.   Pt was seen in our Coumadin Clinic yesterday, 09/17/23 to prepare for upcoming procedure; however Revonda Standard, RN in our clinic reached out to Dr Gabriel Rung office and spoke with Alvis Lemmings who stated Warfarin did NOT need to be held (see note in anticoagulation encounter).   Today, Kay with [facility] was concerned pt was not holding warfarin for procedure due to bleeding risk and reached out to Dr Gabriel Rung office again and spoke with Alvis Lemmings. Now they are stating pt DOES need to hold Warfarin.   Pt does not have cardiac clearance and would most likely need to reschedule his procedure since he has not been cleared to hold Warfarin, required Lovenox bridging in the past and his procedure is in 4 days. Instructed for Joyce Gross to reach back out to Dr Gabriel Rung office and have them fax a cardiac clearance request to our office (fax# [phone])

## 2023-09-18 NOTE — Progress Notes (Signed)
 PERIOPERATIVE PRESCRIPTION FOR IMPLANTED CARDIAC DEVICE PROGRAMMING  Patient Information: Name:  [name]  DOB:  1955-09-13  MRN:  [id number]    Implanted 05-12-2022 Biotronik Acticor ICD   Left chest   Last checked: 08-11-2023    Planned Procedure:  Debridement left heel wound and allograft application  Surgeon: Ovid Curd, DPM  Date of Procedure: 09-23-2023  Cautery will be used.  Position during surgery: Supine with feet hanging off bed.   Please send documentation back to:  [facility] (Fax# [phone]) or Respond to the IB message.  Let me know if the Company Rep. Needs to be contacted.  Device Information:  Clinic EP Physician:  Lewayne Bunting, MD   Device Type:  Defibrillator Manufacturer and Phone #:  Biotronik: [phone] Pacemaker Dependent?:  No. Date of Last Device Check:  08/11/2023 Normal Device Function?:  Yes.    Electrophysiologist's Recommendations:  Have magnet available. Provide continuous ECG monitoring when magnet is used or reprogramming is to be performed.  Procedure should not interfere with device function.  No device programming or magnet placement needed.  Per Device Clinic Standing Orders, Lenor Coffin, RN  1:33 PM 09/18/2023

## 2023-09-18 NOTE — Telephone Encounter (Signed)
 Left [name] a message informing her that Coumadin is being held and patient is Lovenox Bridging for procedure 09/23/23.

## 2023-09-18 NOTE — Telephone Encounter (Signed)
   Pre-operative Risk Assessment    Patient Name: [name]  DOB: [birth year] MRN: [id number]      Request for Surgical Clearance    Procedure:   Wound debris, application of a graft   Date of Surgery:  Clearance 09/23/23                                 Surgeon:  Dr. Ovid Curd Surgeon's Group or Practice Name:  [facility] Phone number:  [phone] Fax number:  [phone]   Type of Clearance Requested:   - Medical  - Pharmacy:  Hold any  def to cards   Type of Anesthesia:   Choice   Additional requests/questions:    Nilda Riggs   09/18/2023, 1:28 PM

## 2023-09-19 LAB — HEMOGLOBIN A1C
Hgb A1c MFr Bld: 5.8 % — ABNORMAL HIGH (ref 4.8–5.6)
Mean Plasma Glucose: 120 mg/dL

## 2023-09-22 ENCOUNTER — Encounter: Payer: Self-pay | Admitting: Internal Medicine

## 2023-09-22 ENCOUNTER — Ambulatory Visit (HOSPITAL_BASED_OUTPATIENT_CLINIC_OR_DEPARTMENT_OTHER): Admitting: Internal Medicine

## 2023-09-22 ENCOUNTER — Ambulatory Visit: Admitting: Internal Medicine

## 2023-09-22 VITALS — BP 130/80 | HR 76 | Temp 97.7°F | Ht 73.0 in | Wt 254.0 lb

## 2023-09-22 DIAGNOSIS — E11622 Type 2 diabetes mellitus with other skin ulcer: Secondary | ICD-10-CM | POA: Diagnosis not present

## 2023-09-22 DIAGNOSIS — L97209 Non-pressure chronic ulcer of unspecified calf with unspecified severity: Secondary | ICD-10-CM

## 2023-09-22 NOTE — Telephone Encounter (Signed)
 I have made a copy for the patient as well as in faxing it back over to them

## 2023-09-22 NOTE — Progress Notes (Unsigned)
 Subjective:   Patient ID: [name], male    DOB: 16-May-1956, 68 y.o.   MRN: [id number]  HPI The patient is a 68 YO man coming in for upcoming possible surgery for wound.    Patient Care Team: Myrlene Broker, MD as PCP - General (Internal Medicine) Jodelle Red, MD as PCP - Cardiology (Cardiology) [facility]) Past Medical History:  Diagnosis Date   Allergy    Asthma    as a teenager - does not use an inhaler although pt said he has an albuteral inhaler   Atrial fibrillation (HCC)    persistant 02/2009   Atrial flutter (HCC)    s/p CTI ablation 08/06/10   Cataract    Chronic rhinitis    Colon polyp    Congestive heart failure (HCC)    Diverticulosis    colonoscopy 04/03/2009   Hyperlipidemia    Hypertension    Morbid obesity (HCC)    target weight = 219  for BMI < 30   Nonischemic cardiomyopathy (HCC)    tachycardia mediated   Seasonal allergies    Sleep apnea    original 2001 - wears C-PaP   Type 2 diabetes mellitus (HCC)    Past Surgical History:  Procedure Laterality Date   ATRIAL FIBRILLATION ABLATION N/A 12/14/2018   Procedure: ATRIAL FIBRILLATION ABLATION;  Surgeon: Hillis Range, MD;  Location: [facility] INVASIVE CV LAB;  Service: Cardiovascular;  Laterality: N/A;   ATRIAL FIBRILLATION ABLATION N/A 10/18/2020   Procedure: ATRIAL FIBRILLATION ABLATION;  Surgeon: Hillis Range, MD;  Location: [facility] INVASIVE CV LAB;  Service: Cardiovascular;  Laterality: N/A;   atrial flutter ablation  08/06/10   CARDIOVERSION N/A 07/05/2018   Procedure: CARDIOVERSION;  Surgeon: Jodelle Red, MD;  Location: [facility] ENDOSCOPY;  Service: Cardiovascular;  Laterality: N/A;   CARDIOVERSION N/A 08/08/2020   Procedure: CARDIOVERSION;  Surgeon: Laurey Morale, MD;  Location: [facility] ENDOSCOPY;  Service: Cardiovascular;  Laterality: N/A;   CARDIOVERSION N/A 08/29/2020   Procedure: CARDIOVERSION;  Surgeon: Dolores Patty, MD;  Location: [facility] ENDOSCOPY;  Service:  Cardiovascular;  Laterality: N/A;   COLONOSCOPY     ICD IMPLANT N/A 05/12/2022   Procedure: ICD IMPLANT;  Surgeon: Marinus Maw, MD;  Location: [facility] INVASIVE CV LAB;  Service: Cardiovascular;  Laterality: N/A;   POLYPECTOMY     RIGHT HEART CATH N/A 08/06/2020   Procedure: RIGHT HEART CATH;  Surgeon: Laurey Morale, MD;  Location: [facility] INVASIVE CV LAB;  Service: Cardiovascular;  Laterality: N/A;   RIGHT/LEFT HEART CATH AND CORONARY ANGIOGRAPHY N/A 11/01/2021   Procedure: RIGHT/LEFT HEART CATH AND CORONARY ANGIOGRAPHY;  Surgeon: Laurey Morale, MD;  Location: [facility] INVASIVE CV LAB;  Service: Cardiovascular;  Laterality: N/A;   TEE WITHOUT CARDIOVERSION N/A 12/14/2018   Procedure: TRANSESOPHAGEAL ECHOCARDIOGRAM (TEE);  Surgeon: Hillis Range, MD;  Location[facility] INVASIVE CV LAB;  Service: Cardiovascular;  Laterality: N/A;   TEE WITHOUT CARDIOVERSION N/A 08/03/2020   Procedure: TRANSESOPHAGEAL ECHOCARDIOGRAM (TEE);  Surgeon: Thurmon Fair, MD;  Location: [facility] ENDOSCOPY;  Service: Cardiovascular;  Laterality: N/A;   TEE WITHOUT CARDIOVERSION N/A 08/08/2020   Procedure: TRANSESOPHAGEAL ECHOCARDIOGRAM (TEE);  Surgeon: Laurey Morale, MD;  Location: [facility] ENDOSCOPY;  Service: Cardiovascular;  Laterality: N/A;   TEE WITHOUT CARDIOVERSION N/A 10/18/2020   Procedure: TRANSESOPHAGEAL ECHOCARDIOGRAM (TEE);  Surgeon: Jake Bathe, MD;  Location: [facility] ENDOSCOPY;  Service: Cardiovascular;  Laterality: N/A;   Family History  Problem Relation Age of Onset   Hypertension Mother  Kidney failure Mother        Due to sepsis   COPD Father    Colon cancer Brother        dx in 47's   Esophageal cancer Neg Hx    Rectal cancer Neg Hx    Stomach cancer Neg Hx     Review of Systems  Constitutional: Negative.   HENT: Negative.    Eyes: Negative.   Respiratory:  Negative for cough, chest tightness and shortness of breath.   Cardiovascular:  Negative for chest pain, palpitations and leg swelling.  Gastrointestinal:   Negative for abdominal distention, abdominal pain, constipation, diarrhea, nausea and vomiting.  Musculoskeletal: Negative.   Skin:  Positive for color change and wound.  Neurological: Negative.   Psychiatric/Behavioral: Negative.      Objective:  Physical Exam Constitutional:      Appearance: He is well-developed.  HENT:     Head: Normocephalic and atraumatic.  Cardiovascular:     Rate and Rhythm: Normal rate and regular rhythm.  Pulmonary:     Effort: Pulmonary effort is normal. No respiratory distress.     Breath sounds: Normal breath sounds. No wheezing or rales.  Abdominal:     General: Bowel sounds are normal. There is no distension.     Palpations: Abdomen is soft.     Tenderness: There is no abdominal tenderness. There is no rebound.  Musculoskeletal:     Cervical back: Normal range of motion.  Skin:    General: Skin is warm and dry.     Comments: Wound not examined today  Neurological:     Mental Status: He is alert and oriented to person, place, and time.     Sensory: Sensory deficit present.     Coordination: Coordination normal.     Vitals:   09/22/23 1449  BP: 130/80  Pulse: 76  Temp: 97.7 F (36.5 C)  TempSrc: Oral  SpO2: 95%  Weight: 254 lb (115.2 kg)  Height: 6\' 1"  (1.854 m)    Assessment & Plan:

## 2023-09-23 ENCOUNTER — Encounter (HOSPITAL_COMMUNITY): Admission: RE | Payer: Self-pay | Source: Home / Self Care

## 2023-09-23 ENCOUNTER — Ambulatory Visit (INDEPENDENT_AMBULATORY_CARE_PROVIDER_SITE_OTHER): Admitting: Podiatry

## 2023-09-23 ENCOUNTER — Ambulatory Visit (HOSPITAL_COMMUNITY): Admission: RE | Admit: 2023-09-23 | Source: Home / Self Care | Admitting: Podiatry

## 2023-09-23 DIAGNOSIS — E11621 Type 2 diabetes mellitus with foot ulcer: Secondary | ICD-10-CM | POA: Diagnosis not present

## 2023-09-23 DIAGNOSIS — L97422 Non-pressure chronic ulcer of left heel and midfoot with fat layer exposed: Secondary | ICD-10-CM | POA: Diagnosis not present

## 2023-09-23 SURGERY — DEBRIDEMENT, WOUND
Anesthesia: Choice | Laterality: Left

## 2023-09-23 MED ORDER — CICLOPIROX 8 % EX SOLN: Freq: Every day | CUTANEOUS | 2 refills | Status: DC

## 2023-09-23 MED ORDER — TRAMADOL HCL 50 MG PO TABS: 50.0000 mg | ORAL_TABLET | Freq: Three times a day (TID) | ORAL | 0 refills | Status: AC | PRN

## 2023-09-25 NOTE — Assessment & Plan Note (Signed)
 Cleared to proceed with surgical debridement and grafting if needed.

## 2023-09-28 ENCOUNTER — Encounter: Admitting: Podiatry

## 2023-09-28 NOTE — Progress Notes (Signed)
 Subjective: 68 year old male presents the office today for wound follow-up as well as for graft application. He has gone to the wound care center and was referred for grafting.  He presents today for graft application.  Does not report any fevers or chills.  Objective: AAO x3, NAD DP/PT pulses palpable bilaterally, CRT less than 3 seconds To the posterior aspect the left along the Achilles tendon is a ulceration measuring 0.8 x 0.8 x 0.2 cm with fibrogranular wound base and a fibrotic plug is present.  After debrided this the wound measured 1 x 1 x 0.4 cm without any probing, undermining or tunneling.  No exposed tendon.  There is no surrounding erythema, ascending size.  No fluctuance or crepitation is noted. No pain with calf compression, swelling, warmth, erythema    Assessment: Chronic ulceration left heel  Plan: -All treatment options discussed with the patient including all alternatives, risks, complications.  -Medically necessary wound debridement was performed today.  Sharply debrided the ulceration with a #312 blade scalpel to remove nonviable, devitalized tissue in order to promote wound healing.  There was minimal blood loss.  Hemostasis achieved.  I then applied a donated piece of MiMedix Epifix graft (exp 2028-01-15 Tissue ID # [id number].  This was secured in place by a Mepilex dressing followed by dry sterile dressing.  Tolerated well. -Continue immobilization. -Elevation -Monitor for any clinical signs or symptoms of infection and directed to call the office immediately should any occur or go to the ER. -Patient encouraged to call the office with any questions, concerns, change in symptoms.   Charity Conch DPM

## 2023-09-29 ENCOUNTER — Encounter: Payer: Medicare HMO | Admitting: Student

## 2023-09-30 ENCOUNTER — Ambulatory Visit: Attending: Cardiovascular Disease

## 2023-09-30 ENCOUNTER — Ambulatory Visit (INDEPENDENT_AMBULATORY_CARE_PROVIDER_SITE_OTHER): Admitting: Podiatry

## 2023-09-30 DIAGNOSIS — L97422 Non-pressure chronic ulcer of left heel and midfoot with fat layer exposed: Secondary | ICD-10-CM

## 2023-09-30 DIAGNOSIS — I4891 Unspecified atrial fibrillation: Secondary | ICD-10-CM | POA: Diagnosis not present

## 2023-09-30 DIAGNOSIS — E11621 Type 2 diabetes mellitus with foot ulcer: Secondary | ICD-10-CM

## 2023-09-30 LAB — POCT INR: INR: 2.2 (ref 2.0–3.0)

## 2023-09-30 NOTE — Patient Instructions (Addendum)
 Description   Take 2 tablets today and then resume taking taking Warfarin 1 tablet daily except for 1.5 tablets on Monday, Wednesday, Friday, and Saturday.  Stay consistent with premier protein drinks (1 time daily)  Recheck INR 2 weeks. Coumadin Clinic [phone]

## 2023-10-01 ENCOUNTER — Encounter: Payer: Self-pay | Admitting: Student

## 2023-10-01 ENCOUNTER — Ambulatory Visit (INDEPENDENT_AMBULATORY_CARE_PROVIDER_SITE_OTHER): Admitting: Student

## 2023-10-01 VITALS — BP 110/80 | HR 64 | Ht 73.0 in | Wt 260.2 lb

## 2023-10-01 DIAGNOSIS — I428 Other cardiomyopathies: Secondary | ICD-10-CM | POA: Diagnosis not present

## 2023-10-01 DIAGNOSIS — I5022 Chronic systolic (congestive) heart failure: Secondary | ICD-10-CM | POA: Diagnosis not present

## 2023-10-01 DIAGNOSIS — I4891 Unspecified atrial fibrillation: Secondary | ICD-10-CM

## 2023-10-01 LAB — CUP PACEART INCLINIC DEVICE CHECK
Battery Remaining Percentage: 100 %
Battery Voltage: 3.11 V
Brady Statistic AS VP Percent: 0 %
Brady Statistic AS VS Percent: 100 %
Brady Statistic RV Percent Paced: 0 %
Date Time Interrogation Session: 20250417121744
HighPow Impedance: 82 Ohm
Implantable Lead Connection Status: 753985
Implantable Lead Implant Date: 20231127
Implantable Lead Location: 753860
Implantable Lead Model: 436909
Implantable Lead Serial Number: 81547891
Implantable Pulse Generator Implant Date: 20231127
Lead Channel Impedance Value: 599 Ohm
Lead Channel Pacing Threshold Amplitude: 0.6 V
Lead Channel Pacing Threshold Pulse Width: 0.4 ms
Lead Channel Setting Pacing Amplitude: 2.5 V
Lead Channel Setting Pacing Pulse Width: 0.4 ms
Lead Channel Setting Sensing Sensitivity: 0.8 mV
Pulse Gen Model: 429525
Pulse Gen Serial Number: 84932826

## 2023-10-01 NOTE — Progress Notes (Signed)
  Electrophysiology Office Note:   ID:  [name], Beg 1955-10-25, MRN [id number]  Primary Cardiologist: Sheryle Donning, MD Electrophysiologist: Manya Sells, MD      History of Present Illness:   [name] is a 68 y.o. male with h/o AF s/p ablation, HTN, HLD, PVD, and NICM/CHF s/p ICD seen today for routine electrophysiology followup.   Recently underwent debridement of poorly healing L heal wound.   Since last being seen in our clinic the patient reports doing well. Overall,  he denies chest pain, palpitations, dyspnea, PND, orthopnea, nausea, vomiting, dizziness, syncope, edema, weight gain, or early satiety.   Review of systems complete and found to be negative unless listed in HPI.   EP Information / Studies Reviewed:    EKG is ordered today. Personal review as below.  EKG Interpretation Date/Time:  Thursday October 01 2023 12:02:50 EDT Ventricular Rate:  64 PR Interval:  240 QRS Duration:  128 QT Interval:  452 QTC Calculation: 466 R Axis:   134  Text Interpretation: Sinus rhythm with 1st degree A-V block with occasional Premature ventricular complexes Right axis deviation Non-specific intra-ventricular conduction block When compared with ECG of 30-Jan-2023 13:14, Premature ventricular complexes are now Present Premature atrial complexes are no longer Present PR interval has increased QRS axis Shifted right Confirmed by Pilar Bridge [id number]) on 10/01/2023 12:14:48 PM    ICD Interrogation-  reviewed in detail today,  See PACEART report.  Arrhythmia/Device History Biotronik Single Chamber ICD 04/2022 for CHF  S/p PVI 10/2020   Physical Exam:   VS:  BP 110/80   Pulse 64   Ht 6\' 1"  (1.854 m)   Wt 260 lb 3.2 oz (118 kg)   SpO2 95%   BMI 34.33 kg/m    Wt Readings from Last 3 Encounters:  10/01/23 260 lb 3.2 oz (118 kg)  09/22/23 254 lb (115.2 kg)  08/19/23 263 lb (119.3 kg)     GEN: No acute distress  NECK: No JVD; No carotid bruits CARDIAC:  Regular rate and rhythm, no murmurs, rubs, gallops RESPIRATORY:  Clear to auscultation without rales, wheezing or rhonchi  ABDOMEN: Soft, non-tender, non-distended EXTREMITIES:  No edema; No deformity   ASSESSMENT AND PLAN:    Chronic systolic CHF  s/p Biotronik single chamber ICD  euvolemic today Stable on an appropriate medical regimen Normal ICD function See Pace Art report No changes today  Persistent AF S/p ablation 10/2020 On coumadin for CHA2DS2/VASc of at least 5. Recently bridged with lovenox for debridement of poorly healing foot wound.     HTN Stable on current regimen  HLD Continue statin   Disposition:   Follow up with EP APP in 12 months   Signed, Tylene Galla, PA-C

## 2023-10-01 NOTE — Patient Instructions (Signed)
 Medication Instructions:  Your physician recommends that you continue on your current medications as directed. Please refer to the Current Medication list given to you today.  *If you need a refill on your cardiac medications before your next appointment, please call your pharmacy*  Lab Work: None ordered If you have labs (blood work) drawn today and your tests are completely normal, you will receive your results only by: MyChart Message (if you have MyChart) OR A paper copy in the mail If you have any lab test that is abnormal or we need to change your treatment, we will call you to review the results.  Follow-Up: At [facility], you and your health needs are our priority.  As part of our continuing mission to provide you with exceptional heart care, our providers are all part of one team.  This team includes your primary Cardiologist (physician) and Advanced Practice Providers or APPs (Physician Assistants and Nurse Practitioners) who all work together to provide you with the care you need, when you need it.  Your next appointment:   1 year(s)  Provider:   You will see one of the following Advanced Practice Providers on your designated Care Team:   Francis Dowse, [place] Casimiro Needle "Mardelle Matte" Tillery, PA-C Canary Brim, NP      1st Floor: - Lobby - Registration  - Pharmacy  - Lab - Cafe  2nd Floor: - PV Lab - Diagnostic Testing (echo, CT, nuclear med)  3rd Floor: - Vacant  4th Floor: - TCTS (cardiothoracic surgery) - AFib Clinic - Structural Heart Clinic - Vascular Surgery  - Vascular Ultrasound  5th Floor: - HeartCare Cardiology (general and EP) - Clinical Pharmacy for coumadin, hypertension, lipid, weight-loss medications, and med management appointments    Valet parking services will be available as well.

## 2023-10-02 NOTE — Telephone Encounter (Signed)
   Patient Name: [name]  DOB: [birth year] MRN: [id number]  Primary Cardiologist: Sheryle Donning, MD  Chart reviewed as part of pre-operative protocol coverage. Given past medical history and time since last visit, based on ACC/AHA guidelines, [name] is at acceptable risk for the planned procedure without further cardiovascular testing.   Per office protocol, patient can hold warfarin for 5 days prior to procedure.     Patient WILL need bridging with Lovenox  (enoxaparin ) around procedure.   Bridge to be coordinated by coumadin  clinic and patient contacted by clinic RN with information and instructions provided.  The patient was advised that if he develops new symptoms prior to surgery to contact our office to arrange for a follow-up visit, and he verbalized understanding.  I will route this recommendation to the requesting party via Epic fax function and remove from pre-op pool.  Please call with questions.  Francene Ing, Retha Cast, NP 10/02/2023, 8:48 AM

## 2023-10-04 NOTE — Progress Notes (Signed)
 Subjective: Chief Complaint  Patient presents with   Wound Check    RM#12 Left foot wound check also has blister from wrap.    68 year old male presents the office today for wound follow-up as well as for possible graft application.  States that he noticed a small wound on the front of his leg.  Denies any drainage or pus or any fevers or chills.  Objective: AAO x3, NAD DP/PT pulses palpable bilaterally, CRT less than 3 seconds To the posterior aspect the left along the Achilles tendon is a ulceration measuring 1 x 1 x 0.4 cm without any probing, undermining or tunneling.  There is however in creased granulation tissue present.  There is 1 area that is still present but otherwise it is more superficial.  No exposed tendon.  There is no surrounding erythema, ascending size.  No fluctuance or crepitation is noted.  There are some slight erythema but again this is coming more from the bandage as it is well-defined with the bandage markings.  On the anterior aspect the left leg is what appears to be a superficial blood blister that any drainage or pus or any fluctuation or crepitation. No pain with calf compression, swelling, warmth, erythema   Assessment: Chronic ulceration left heel  Plan: -All treatment options discussed with the patient including all alternatives, risks, complications.  -I held off on new graft today.  Adaptic was applied followed by dressing.  Discussed with me continue dressing changes at home with a similar bandage as well. -Adaptic to the anterior blister -Monitor for any clinical signs or symptoms of infection and directed to call the office immediately should any occur or go to the ER.  Likely graft application next week  Charity Conch DPM

## 2023-10-07 ENCOUNTER — Ambulatory Visit (INDEPENDENT_AMBULATORY_CARE_PROVIDER_SITE_OTHER): Admitting: Podiatry

## 2023-10-07 DIAGNOSIS — E11621 Type 2 diabetes mellitus with foot ulcer: Secondary | ICD-10-CM | POA: Diagnosis not present

## 2023-10-07 DIAGNOSIS — L97422 Non-pressure chronic ulcer of left heel and midfoot with fat layer exposed: Secondary | ICD-10-CM

## 2023-10-07 NOTE — Progress Notes (Signed)
 Subjective: No chief complaint on file.    68 year old male presents the office today for wound follow-up as well as for possible graft application.  States he has been doing well.  He has been changing the bandage.  No fevers or chills.  No other concerns.    Objective: AAO x3, NAD DP/PT pulses palpable bilaterally, CRT less than 3 seconds To the posterior aspect the left along the Achilles tendon is a ulceration measuring 0.7 x 0.7 x 0.3 cm postdebridement without any probing, undermining or tunneling.  Wound was much smaller 0.3 x 0.2 x 0.2 cm but was covered loosely with callus, hyperkeratotic tissue.  I debrided on healthy, granular, bleeding tissue.  There is no surrounding erythema, ascending cellulitis there is no fluctuation or crepitation.  There is no malodor.  No exposed bone or tendon.  On the area of the hemorrhagic blister on the anterior leg this is dried up.  There is no surrounding erythema, ascending cellulitis.  No signs of infection. No pain with calf compression, swelling, warmth, erythema   Assessment: Chronic ulceration left heel  Plan: -All treatment options discussed with the patient including all alternatives, risks, complications.  - Medically necessary wound debridement was performed today.  Sharply debrided the wound with resultant scalpel to remove nonviable, devitalized tissue in order for wound healing.  Debrided to healthy, bleeding, granular tissue.  Minimal blood loss.  Hemostasis achieved to medial compression.  I then applied a donated piece of Epifix graft to the wound and was secured in place with mepilex.  Dressing applied. Elevate foot. Surgical shoe to avoid pressure.  - Discussed that he needs that she is out of manage she can otherwise leave the inner dressing intact. -Monitor for any clinical signs or symptoms of infection and directed to call the office immediately should any occur or go to the ER.  No follow-ups on file.  Charity Conch  DPM

## 2023-10-08 ENCOUNTER — Encounter: Admitting: Podiatry

## 2023-10-08 ENCOUNTER — Other Ambulatory Visit (HOSPITAL_COMMUNITY): Payer: Self-pay | Admitting: Cardiology

## 2023-10-14 ENCOUNTER — Ambulatory Visit: Attending: Cardiovascular Disease | Admitting: *Deleted

## 2023-10-14 DIAGNOSIS — I513 Intracardiac thrombosis, not elsewhere classified: Secondary | ICD-10-CM

## 2023-10-14 DIAGNOSIS — I4891 Unspecified atrial fibrillation: Secondary | ICD-10-CM | POA: Diagnosis not present

## 2023-10-14 LAB — POCT INR: INR: 2.8 (ref 2.0–3.0)

## 2023-10-14 NOTE — Patient Instructions (Signed)
 Description   Continue taking Warfarin 1 tablet daily except for 1.5 tablets on Monday, Wednesday, Friday, and Saturday.  Stay consistent with premier protein drinks (1 time daily)  Recheck INR 4 weeks. [facility]inic [phone]

## 2023-10-15 ENCOUNTER — Ambulatory Visit: Admitting: Podiatry

## 2023-10-15 ENCOUNTER — Encounter: Payer: Self-pay | Admitting: Podiatry

## 2023-10-15 DIAGNOSIS — E11621 Type 2 diabetes mellitus with foot ulcer: Secondary | ICD-10-CM | POA: Diagnosis not present

## 2023-10-15 DIAGNOSIS — L97422 Non-pressure chronic ulcer of left heel and midfoot with fat layer exposed: Secondary | ICD-10-CM

## 2023-10-15 NOTE — Progress Notes (Signed)
 Subjective: Chief Complaint  Patient presents with   Wound Check    RM#12 Left heel wound care patient states experiencing nerve pain.     68 year old male presents the office today for wound follow-up as well as for possible graft application.  States he has been doing well.  Does not report any fevers or chills today.   States he has intermittent sharp pain but this is not new and has been ongoing.  He has gabapentin  at home but does not feel it is bad enough to take it.  Objective: AAO x3, NAD DP/PT pulses palpable bilaterally, CRT less than 3 seconds To the posterior aspect the left along the Achilles tendon is a ulceration measuring 0.6 x 0.6 cm.  It is having increased granulation tissue present.  There is hyperkeratotic tissue on the periwound although minimal.  There is no surrounding erythema, ascending size.  No fluctuation, crepitation, malodor. No pain with calf compression, swelling, warmth, erythema  Assessment: Chronic ulceration left heel  Plan: -All treatment options discussed with the patient including all alternatives, risks, complications.  -I did lightly debride the wounds today cleaned.  Adaptic was applied followed by dressing.  He can continue with dressing changes at home. -Elevation -Likely repeat graft application next week if needed. -Monitor for any clinical signs or symptoms of infection and directed to call the office immediately should any occur or go to the ER.  Return in about 1 week (around 10/22/2023) for wound.  Charity Conch DPM

## 2023-10-22 ENCOUNTER — Encounter: Payer: Self-pay | Admitting: Podiatry

## 2023-10-22 ENCOUNTER — Encounter: Admitting: Podiatry

## 2023-10-22 ENCOUNTER — Ambulatory Visit: Admitting: Podiatry

## 2023-10-22 DIAGNOSIS — E11621 Type 2 diabetes mellitus with foot ulcer: Secondary | ICD-10-CM

## 2023-10-22 DIAGNOSIS — L97422 Non-pressure chronic ulcer of left heel and midfoot with fat layer exposed: Secondary | ICD-10-CM | POA: Diagnosis not present

## 2023-10-22 MED ORDER — DOXYCYCLINE HYCLATE 100 MG PO TABS: 100.0000 mg | ORAL_TABLET | Freq: Two times a day (BID) | ORAL | 0 refills | Status: DC

## 2023-10-22 NOTE — Progress Notes (Signed)
 Subjective: Chief Complaint  Patient presents with   Wound Check    RM#14 Left foot wound check patient states having drainage from area.     68 year old male presents the office today for wound follow-up as well as for possible graft application.  He is he has noticed some puffiness around the wounds had some mild drainage.  He does not report any fevers or chills today.  No increase in pain.  Objective: AAO x3, NAD DP/PT pulses palpable bilaterally, CRT less than 3 seconds To the posterior aspect the left along the Achilles tendon is a ulceration which is mostly covered with callus and the wound measures 0.3 x 0.3 x 0.1 cm.  After debrided this it was measuring 0.5 x 0.5 x 0.3 cm.  There is initially a little small amount of purulence underneath the callus once I debrided this there is resolved there is no further drainage I debrided to healthy, bleeding tissue.  There is no probing to bone or to the tendon.  There is no surrounding erythema, ascending cellulitis.  Minimal edema around the wound itself.  No fluctuation or crepitation.  No malodor. No pain with calf compression, swelling, warmth, erythema  Assessment: Chronic ulceration left Achilles  Plan: -All treatment options discussed with the patient including all alternatives, risks, complications.  -Medically necessary wound debridement was called today.  Sharply debrided the wound with a #312 with scalpel remove nonviable, devitalized, infected tissue, bone wound healing.  There is minimal blood loss.  Hemostasis achieved and manual compression.  He tolerated well.  Debrided to healthy, bleeding tissue.  Aquacel was applied followed by dressing.  Discussed with him every other day dressing changes.  Encouraged offloading. -Doxycycline -Monitor for any clinical signs or symptoms of infection and directed to call the office immediately should any occur or go to the ER.  Return in about 10 days (around 11/01/2023).  [name]  DPM

## 2023-10-25 ENCOUNTER — Other Ambulatory Visit (HOSPITAL_COMMUNITY): Payer: Self-pay | Admitting: Cardiology

## 2023-10-25 DIAGNOSIS — I5022 Chronic systolic (congestive) heart failure: Secondary | ICD-10-CM

## 2023-10-30 DIAGNOSIS — E1169 Type 2 diabetes mellitus with other specified complication: Secondary | ICD-10-CM | POA: Diagnosis not present

## 2023-11-02 ENCOUNTER — Ambulatory Visit (INDEPENDENT_AMBULATORY_CARE_PROVIDER_SITE_OTHER): Admitting: Podiatry

## 2023-11-02 DIAGNOSIS — E11621 Type 2 diabetes mellitus with foot ulcer: Secondary | ICD-10-CM

## 2023-11-02 DIAGNOSIS — L97422 Non-pressure chronic ulcer of left heel and midfoot with fat layer exposed: Secondary | ICD-10-CM

## 2023-11-03 ENCOUNTER — Other Ambulatory Visit: Payer: Self-pay | Admitting: Internal Medicine

## 2023-11-06 ENCOUNTER — Other Ambulatory Visit (HOSPITAL_COMMUNITY): Payer: Self-pay | Admitting: Cardiology

## 2023-11-08 ENCOUNTER — Other Ambulatory Visit (HOSPITAL_COMMUNITY): Payer: Self-pay | Admitting: Cardiology

## 2023-11-09 NOTE — Progress Notes (Signed)
 Subjective: Chief Complaint  Patient presents with   Foot Ulcer    RM#14 Left foot ulcer.     68 year old male presents the office today for wound follow-up as well as for possible graft application.  States he has been feeling well.  He does not report any fevers or chills today.  No other concerns.    Objective: AAO x3, NAD DP/PT pulses palpable bilaterally, CRT less than 3 seconds To the posterior aspect the left along the Achilles tendon is a ulceration which still is mostly covered with callus.  Once I debrided this the wound measured 0.5 x 0.5 x 0.2cm, appears to be more superficial.  There is no drainage or purulence noted today.  There is no fluctuation or crepitation.  I was able to debride the wound down to healthy, granular tissue.  There is no probing to bone or tendon.  There is no malodor.  No pain with calf compression, swelling, warmth, erythema    Assessment: Chronic ulceration left Achilles  Plan: -All treatment options discussed with the patient including all alternatives, risks, complications.  -Medically necessary wound debridement was called today.  Sharply debrided the wound with a #312 with scalpel remove nonviable, devitalized, infected tissue, bone wound healing.  There is minimal blood loss.  Hemostasis achieved and manual compression.  He tolerated well.  Debrided to healthy, bleeding granular tissue.   -Today applied a donated piece of MIMEDX amniotic allograft with expiration date April 16, 2028 and tissue ID number [id number].  Was secured in place by Mepilex followed by dressing -Offloading -Monitor for any clinical signs or symptoms of infection and directed to call the office immediately should any occur or go to the ER.  No follow-ups on file.   Charity Conch DPM

## 2023-11-10 ENCOUNTER — Ambulatory Visit (INDEPENDENT_AMBULATORY_CARE_PROVIDER_SITE_OTHER): Payer: Medicare HMO

## 2023-11-10 ENCOUNTER — Ambulatory Visit (INDEPENDENT_AMBULATORY_CARE_PROVIDER_SITE_OTHER): Admitting: Podiatry

## 2023-11-10 ENCOUNTER — Encounter: Payer: Self-pay | Admitting: Physician Assistant

## 2023-11-10 ENCOUNTER — Ambulatory Visit: Admitting: Physician Assistant

## 2023-11-10 VITALS — BP 127/84 | HR 64

## 2023-11-10 DIAGNOSIS — D229 Melanocytic nevi, unspecified: Secondary | ICD-10-CM

## 2023-11-10 DIAGNOSIS — L821 Other seborrheic keratosis: Secondary | ICD-10-CM

## 2023-11-10 DIAGNOSIS — L578 Other skin changes due to chronic exposure to nonionizing radiation: Secondary | ICD-10-CM

## 2023-11-10 DIAGNOSIS — I428 Other cardiomyopathies: Secondary | ICD-10-CM

## 2023-11-10 DIAGNOSIS — D1801 Hemangioma of skin and subcutaneous tissue: Secondary | ICD-10-CM | POA: Diagnosis not present

## 2023-11-10 DIAGNOSIS — W908XXA Exposure to other nonionizing radiation, initial encounter: Secondary | ICD-10-CM

## 2023-11-10 DIAGNOSIS — Z1283 Encounter for screening for malignant neoplasm of skin: Secondary | ICD-10-CM | POA: Diagnosis not present

## 2023-11-10 DIAGNOSIS — L814 Other melanin hyperpigmentation: Secondary | ICD-10-CM

## 2023-11-10 DIAGNOSIS — E11621 Type 2 diabetes mellitus with foot ulcer: Secondary | ICD-10-CM

## 2023-11-10 DIAGNOSIS — L570 Actinic keratosis: Secondary | ICD-10-CM

## 2023-11-10 DIAGNOSIS — L97422 Non-pressure chronic ulcer of left heel and midfoot with fat layer exposed: Secondary | ICD-10-CM

## 2023-11-10 DIAGNOSIS — I5022 Chronic systolic (congestive) heart failure: Secondary | ICD-10-CM

## 2023-11-10 NOTE — Progress Notes (Signed)
   New Patient Visit   Subjective  [name] is a 68 y.o. male who presents for the following:  Concerning lesion on left ear which was noticed by his PCP.   Patient present today for new patient visit for upper body skin exam. The patient denies he  has spots, moles and lesions to be evaluated, some may be new or changing and the patient may have concern these could be cancer. Patient has not previously been treated by a dermatologist.Patient reports he  does not have hx of bx. Patient denies family history of skin cancers. Patient reports throughout his lifetime has had moderate sun exposure. Currently, patient reports if he  has excessive sun exposure, he  does apply sunscreen and/or wears protective coverings.  The following portions of the chart were reviewed this encounter and updated as appropriate: medications, allergies, medical history  Review of Systems:  No other skin or systemic complaints except as noted in HPI or Assessment and Plan.  Objective  Well appearing patient in no apparent distress; mood and affect are within normal limits.  A upper body skin exam was performed including scalp, head, eyes, ears, nose, lips, neck, chest, axillae, abdomen, back, bilateral upper extremities, hands, fingers, and fingernails. All findings within normal limits unless otherwise noted below.   Relevant exam findings are noted in the Assessment and Plan.  Left helical rim, Right Superior Helix (2) Erythematous thin papules/macules with gritty scale.   Assessment & Plan   LENTIGINES, SEBORRHEIC KERATOSES, HEMANGIOMAS - Benign normal skin lesions - Benign-appearing - Call for any changes  MELANOCYTIC NEVI - Tan-brown and/or pink-flesh-colored symmetric macules and papules - Benign appearing on exam today - Observation - Call clinic for new or changing moles - Recommend daily use of broad spectrum spf 30+ sunscreen to sun-exposed areas.   ACTINIC DAMAGE - MODERATE  - Chronic  condition, secondary to cumulative UV/sun exposure - diffuse scaly erythematous macules with underlying dyspigmentation - Recommend daily broad spectrum sunscreen SPF 30+ to sun-exposed areas, reapply every 2 hours as needed.  - Staying in the shade or wearing long sleeves, sun glasses (UVA+UVB protection) and wide brim hats (4-inch brim around the entire circumference of the hat) are also recommended for sun protection.  - Call for new or changing lesions.  CHERRY ANGIOMA - dark purple blanching macule Exam: Left Superior Helix.  Treatment Plan: Reassurance.     SKIN CANCER SCREENING PERFORMED TODAY ACTINIC KERATOSIS (3) Left helical rim, Right Superior Helix (2) Destruction of lesion - Left helical rim, Right Superior Helix (2) Complexity: simple   Destruction method: cryotherapy   Informed consent: discussed and consent obtained   Timeout:  patient name, date of birth, surgical site, and procedure verified Lesion destroyed using liquid nitrogen: Yes   Region frozen until ice ball extended beyond lesion: Yes   Outcome: patient tolerated procedure well with no complications   Post-procedure details: wound care instructions given   MULTIPLE BENIGN NEVI   SEBORRHEIC KERATOSIS   ACTINIC SKIN DAMAGE   LENTIGINES   CHERRY ANGIOMA   HEMANGIOMA, UNSPECIFIED SITE    Return if symptoms worsen or fail to improve, for patient prefers to call.   Documentation: I have reviewed the above documentation for accuracy and completeness, and I agree with the above.  Antonette Hendricks K, PA-C

## 2023-11-10 NOTE — Patient Instructions (Signed)

## 2023-11-11 ENCOUNTER — Ambulatory Visit: Attending: Cardiovascular Disease

## 2023-11-11 ENCOUNTER — Telehealth: Payer: Self-pay

## 2023-11-11 DIAGNOSIS — I4891 Unspecified atrial fibrillation: Secondary | ICD-10-CM | POA: Diagnosis not present

## 2023-11-11 LAB — CUP PACEART REMOTE DEVICE CHECK
Date Time Interrogation Session: 20250527081908
Implantable Lead Connection Status: 753985
Implantable Lead Implant Date: 20231127
Implantable Lead Location: 753860
Implantable Lead Model: 436909
Implantable Lead Serial Number: 81547891
Implantable Pulse Generator Implant Date: 20231127
Pulse Gen Model: 429525
Pulse Gen Serial Number: 84932826

## 2023-11-11 LAB — POCT INR: INR: 2.8 (ref 2.0–3.0)

## 2023-11-11 NOTE — Telephone Encounter (Signed)
   Patient Name: ALCEE SIPOS  DOB: 1955/11/01 MRN: [id number]  Primary Cardiologist: Sheryle Donning, MD  Chart reviewed as part of pre-operative protocol coverage. Given past medical history and time since last visit, based on ACC/AHA guidelines, DANYAL WHITENACK is at acceptable risk for the planned procedure without further cardiovascular testing. Additionally, patient seen in office by Michaelle Adolphus PA-C on 10/01/23. Based on patient status at time of this visit, he feels that the patient is at low risk to proceed without further work up.   If he develops new symptoms prior to surgery including high risk symptoms such as chest pain, shortness of breath, syncope, patient should contact our office to arrange for a follow-up visit before surgery.  Regarding anticoagulation:  CHA2DS2-VASc Score = 4  This indicates a 4.8% annual risk of stroke. The patient's score is based upon: CHF History: 1 HTN History: 1 Diabetes History: 1 Stroke History: 0 Vascular Disease History: 0 Age Score: 1 Gender Score: 0       CrCl 93 ml/min Platelet count 172K     Patient does not require pre-op antibiotics for dental procedure.   Per office protocol, patient can hold warfarin for 5 days prior to procedure.     Patient WILL need bridging with Lovenox  (enoxaparin ) around procedure.This will be organized by Coumadin  Clinic.  I will route this recommendation to the requesting party via Epic fax function and remove from pre-op pool.  Please call with questions.  Leala Prince, PA-C 11/11/2023, 11:23 AM

## 2023-11-11 NOTE — Progress Notes (Signed)
 Subjective: No chief complaint on file.    68 year old male presents the office today for wound follow-up as well as for possible graft application.  He has been doing well and he does not report any fevers or chills.  Change the bandage from drainage but no purulence.  Does not report any fevers or chills.   Objective: AAO x3, NAD DP/PT pulses palpable bilaterally, CRT less than 3 seconds To the posterior aspect the left along the Achilles tendon is a ulceration which still is mostly covered with callus/proud.  No sore.  Once I remove this the wound appears to be filling in.  Fibrogranular wound base is noted.  There is no surrounding erythema, ascending cellulitis.  No fluctuance or crepitation.  No malodor. No pain with calf compression, swelling, warmth, erythema    Assessment: Chronic ulceration left Achilles  Plan: -All treatment options discussed with the patient including all alternatives, risks, complications.  -Remove the bandage today.  It appears the wound is filling in.  Debrided the periphery of the wound without any complications or bleeding.  I held off another graft today but likely be applied this next week.  Continue local wound care for now.  Offloading. -Monitor for any clinical signs or symptoms of infection and directed to call the office immediately should any occur or go to the ER.  Return in about 1 week (around 11/17/2023).  Charity Conch DPM

## 2023-11-11 NOTE — Patient Instructions (Addendum)
 Description   Continue taking Warfarin 1 tablet daily except for 1.5 tablets on Monday, Wednesday, Friday, and Saturday.  Stay consistent with premier protein drinks (1 time daily)  Recheck INR 5 weeks. Coumadin  Clinic [phone]

## 2023-11-11 NOTE — Telephone Encounter (Signed)
   Pre-operative Risk Assessment    Patient Name: [name]  DOB: [birth year] MRN: [id number]   Date of last office visit: 10/01/23 Norvin Bees Date of next office visit: Coumadin  Clinic 11/11/23  Request for Surgical Clearance    Procedure:  Dental Extraction - Amount of Teeth to be Pulled:  8  Date of Surgery:  Clearance TBD                                Surgeon:  Dr. Clary Crown Surgeon's Group or Practice Name:  [facility] Phone number:  [phone] Fax number:  [phone]   Type of Clearance Requested:   - Medical  - Pharmacy:  Hold Warfarin (Coumadin )     Type of Anesthesia:  Local 4% Articaine w/ epinephrine   Additional requests/questions:    Signed, Jove Beyl   11/11/2023, 7:59 AM

## 2023-11-11 NOTE — Telephone Encounter (Signed)
 Patient with diagnosis of A Fib plus atrial appendage thrombus on warfarin for anticoagulation.    Procedure: Dental Extraction - Amount of Teeth to be Pulled:  8  Date of procedure: TBD   CHA2DS2-VASc Score = 4  This indicates a 4.8% annual risk of stroke. The patient's score is based upon: CHF History: 1 HTN History: 1 Diabetes History: 1 Stroke History: 0 Vascular Disease History: 0 Age Score: 1 Gender Score: 0     CrCl 93 ml/min Platelet count 172K   Patient does not require pre-op antibiotics for dental procedure.  Per office protocol, patient can hold warfarin  for 5 days prior to procedure.    Patient WILL need bridging with Lovenox  (enoxaparin ) around procedure.This will be organized by Coumadin  Clinic  **This guidance is not considered finalized until pre-operative APP has relayed final recommendations.**

## 2023-11-12 ENCOUNTER — Ambulatory Visit: Payer: Self-pay | Admitting: Internal Medicine

## 2023-11-17 ENCOUNTER — Ambulatory Visit (INDEPENDENT_AMBULATORY_CARE_PROVIDER_SITE_OTHER): Admitting: Podiatry

## 2023-11-17 DIAGNOSIS — E11621 Type 2 diabetes mellitus with foot ulcer: Secondary | ICD-10-CM

## 2023-11-17 DIAGNOSIS — L97422 Non-pressure chronic ulcer of left heel and midfoot with fat layer exposed: Secondary | ICD-10-CM

## 2023-11-18 NOTE — Progress Notes (Signed)
 Subjective: Chief Complaint  Patient presents with   Foot Ulcer    R#13 Follow up left foot ulcer.     68 year old male presents the office today for wound follow-up as well as for possible graft application.  He did change the bandage once he had very minimal bloody drainage but denies any pus.  He had 1 day of discomfort over the weekend that resolved fairly quickly.  He currently does not have any pain.  He does not report any fevers or chills.   Objective: AAO x3, NAD DP/PT pulses palpable bilaterally, CRT less than 3 seconds To the posterior aspect the left along the Achilles tendon is a ulceration which still is mostly covered with callus.  After I debrided this there is still a wound present measuring 0.4 x 0.3 x 0.2 cm.  Wound is more superficial and there is no probing, undermining or tunneling.  There is no cellulitis present.  There is no fluctuation or crepitation but there is no malodor. No pain with calf compression, swelling, warmth, erythema      Assessment: Chronic ulceration left Achilles  Plan: -All treatment options discussed with the patient including all alternatives, risks, complications.  -Medically necessary wound debridement was performed today.  Sharply debrided the hyperkeratotic tissue to reveal the underlying ulcerations of the #312 with scalpel to remove nonviable, devitalized tissue in order to promote wound healing.  There is no significant blood loss.  Tolerated well without any complications.  Cleaned the area with saline.  I then applied a donated piece of MIMEDX graft and was secured in place with Mepilex dressing. -Offloading, Elevation, compression -Monitor for any clinical signs or symptoms of infection and directed to call the office immediately should any occur or go to the ER.  Return in about 1 week (around 11/24/2023) for wound care.  Charity Conch DPM

## 2023-11-24 ENCOUNTER — Ambulatory Visit (INDEPENDENT_AMBULATORY_CARE_PROVIDER_SITE_OTHER): Admitting: Podiatry

## 2023-11-24 DIAGNOSIS — E11621 Type 2 diabetes mellitus with foot ulcer: Secondary | ICD-10-CM

## 2023-11-24 DIAGNOSIS — L97422 Non-pressure chronic ulcer of left heel and midfoot with fat layer exposed: Secondary | ICD-10-CM | POA: Diagnosis not present

## 2023-11-25 NOTE — Progress Notes (Signed)
 Subjective: Chief Complaint  Patient presents with   Foot Ulcer    Rm 11 Patient is here for diabetic ulcer on the left achilles. Wound looks good, no drainage, swelling or pain. Patient states no additional concerns.     68 year old male presents the office today for wound follow-up as well as for possible graft application.  States he has been doing well.  Had minimal amount of bleeding on the bandage when he did change it but no pus.  No increase in swelling or redness.  He has no pain.  Denies any fevers or chills.  No other concerns today.   Objective: AAO x3, NAD DP/PT pulses palpable bilaterally, CRT less than 3 seconds To the posterior aspect the left along the Achilles tendon distal ulceration.  It was covered mostly with scab, callus once I debrided the wound measures smaller at 0.3 x 0.2 x 0.1 cm.  There is no surrounding erythema, ascending cellulitis.  There is no fluctuation or crepitation.  There is no malodor.  No pain with calf compression, swelling, warmth, erythema  Assessment: Chronic ulceration left Achilles  Plan: -All treatment options discussed with the patient including all alternatives, risks, complications.  -Medically necessary wound debridement was performed today.  Sharply debrided the hyperkeratotic tissue to reveal the underlying ulcerations of the #312 with scalpel to remove nonviable, devitalized tissue in order to promote wound healing.  There is no significant blood loss.  Tolerated well without any complications.  Cleaned the area with saline.  Adaptic applied followed by dressing.  Continue to change the dressing changes -Offloading, Elevation, compression -Monitor for any clinical signs or symptoms of infection and directed to call the office immediately should any occur or go to the ER.  Return in about 1 week (around 12/01/2023) for ulcer.  Charity Conch DPM

## 2023-12-01 ENCOUNTER — Ambulatory Visit (INDEPENDENT_AMBULATORY_CARE_PROVIDER_SITE_OTHER): Admitting: Podiatry

## 2023-12-01 DIAGNOSIS — L97422 Non-pressure chronic ulcer of left heel and midfoot with fat layer exposed: Secondary | ICD-10-CM | POA: Diagnosis not present

## 2023-12-01 DIAGNOSIS — E11621 Type 2 diabetes mellitus with foot ulcer: Secondary | ICD-10-CM

## 2023-12-01 NOTE — Patient Instructions (Addendum)
 Wash the wound with saline, dry well.  Apply a small amount of the collagen, Prisma, with a small amount of antibiotic ointment. Apply gauze and wrap. Change at least every other day.   Monitor for any signs/symptoms of infection. Call the office immediately if any occur or go directly to the emergency room. Call with any questions/concerns.

## 2023-12-03 ENCOUNTER — Ambulatory Visit (INDEPENDENT_AMBULATORY_CARE_PROVIDER_SITE_OTHER): Payer: Medicare HMO | Admitting: Internal Medicine

## 2023-12-03 ENCOUNTER — Encounter: Payer: Self-pay | Admitting: Internal Medicine

## 2023-12-03 VITALS — BP 118/60 | HR 74 | Ht 73.0 in | Wt 259.2 lb

## 2023-12-03 DIAGNOSIS — Z794 Long term (current) use of insulin: Secondary | ICD-10-CM | POA: Diagnosis not present

## 2023-12-03 DIAGNOSIS — E039 Hypothyroidism, unspecified: Secondary | ICD-10-CM

## 2023-12-03 DIAGNOSIS — E1169 Type 2 diabetes mellitus with other specified complication: Secondary | ICD-10-CM | POA: Diagnosis not present

## 2023-12-03 DIAGNOSIS — Z7984 Long term (current) use of oral hypoglycemic drugs: Secondary | ICD-10-CM

## 2023-12-03 DIAGNOSIS — E785 Hyperlipidemia, unspecified: Secondary | ICD-10-CM

## 2023-12-03 LAB — POCT GLYCOSYLATED HEMOGLOBIN (HGB A1C): Hemoglobin A1C: 5.8 % — AB (ref 4.0–5.6)

## 2023-12-03 MED ORDER — OZEMPIC (1 MG/DOSE) 4 MG/3ML ~~LOC~~ SOPN: 1.0000 mg | PEN_INJECTOR | SUBCUTANEOUS | 3 refills | Status: AC

## 2023-12-03 MED ORDER — EMPAGLIFLOZIN 10 MG PO TABS: 10.0000 mg | ORAL_TABLET | Freq: Every day | ORAL | 3 refills | Status: AC

## 2023-12-03 NOTE — Patient Instructions (Addendum)
 Please continue: - Jardiance  10 mg before b'fast - Tresiba  U200 28 units daily  Please increase: - Ozempic  1 mg weekly in a.m.   Use: - Novolog  6-8 units only before a larger meal  Please continue Levothyroxine  50 mcg daily.  Take the thyroid  hormone every day, with water, at least 30 minutes before breakfast, separated by at least 4 hours from: - acid reflux medications - calcium  - iron - multivitamins  Please return in 6 months.

## 2023-12-03 NOTE — Addendum Note (Signed)
 Addended by: Vernon Goodpasture on: 12/03/2023 03:24 PM   Modules accepted: Orders

## 2023-12-03 NOTE — Progress Notes (Signed)
 Patient ID: [name], male   DOB: July 02, 1955, 68 y.o.   MRN: [id number]   HPI: [name] is a 68 y.o.-year-old male, initially referred by his PCP, [name], returning for follow-up for DM2, dx in 2017, insulin -dependent, uncontrolled, with complications (CHF, Afib/AFlutter, CKD, h/o calf diabetic ulcer).  Last visit 6 mo ago.  Interim history: He denies blurred vision, nausea.  He has nocturia-chronic. He has a history of shin ulcers, now almost completely healed. Seeing wound center and the podiatrist.    Reviewed HbA1c levels Lab Results  Component Value Date   HGBA1C 5.8 (H) 09/18/2023   HGBA1C 6.0 (A) 06/04/2023   HGBA1C 5.7 (A) 12/02/2022   HGBA1C 5.7 (A) 05/20/2022   HGBA1C 5.9 (A) 11/19/2021   HGBA1C 6.0 (A) 05/16/2021   HGBA1C 6.0 (A) 11/08/2020   HGBA1C 5.3 08/04/2020   HGBA1C 5.7 (A) 06/29/2020   HGBA1C 5.3 02/24/2020   In the past, he was on: - Levemir  40 units 2x a day - Novolog  5 units + SSI 3x a day, before meals: BG 70-120: 0 Units, BG 121-150:3 units, BG 151-200:4 Units, BG 201-250:7 units BG 251-300: 11 Units, 301-350 : 15 units. If BG >350: Call your primary care doctor He was on Glipizide  since 2017.  Now on:  - Jardiance  10 mg before b'fast - Ozempic  0.5 mg weekly in a.m.  - Tresiba  U200 66 >> 50 >> 40 >> 32 >> 28 units daily - Novolog  8-12 units based on the size of the meal (8-12-8 units) >> 8-10 (12) before meals >> tried to stop but had to restart >> 5 to 8 >> 8-12 units before a larger meal only >> at this visit, he tells me he is taking this before every meal  He checks his sugars more than 4 times a day with his freestyle libre CGM - from [facility]:  Previously:  Prev.:  Lowest sugar was  58 (delayed lunch) >> ...78 >> 100 >> 84; he has hypoglycemia awareness in the 70s. Highest sugar was 330 >> .[name].190 >> 217 >> 201. He wasadmitted in 08/2019 with hyperosmolar hyperglycemic nonketotic state, and was found to have a glucose of 1186, along  with hyponatremia and AKI.  An HbA1c level was very high.  Glucometer: [facility]   Pt's meals are: - Breakfast: fruit >>  >> -  almond milk with bran flakes, berries, nuts - Lunch: salad - Dinner: salad, chili, hamburger patty, grilled chicken -moved dinner earlier - Snacks:- He saw nutrition in 2016.  -+ CKD, last BUN/creatinine:  Lab Results  Component Value Date   BUN 18 09/18/2023   BUN 29 (H) 08/19/2023   CREATININE 1.27 (H) 09/18/2023   CREATININE 1.43 08/19/2023   Lab Results  Component Value Date   MICRALBCREAT 32 (H) 06/04/2023   MICRALBCREAT 36.5 (H) 12/02/2022   MICRALBCREAT 51.1 (H) 05/16/2020   MICRALBCREAT 20.4 03/08/2019   MICRALBCREAT 9.2 11/26/2017  On Cozaar  50.  -+ HL; last set of lipids: Lab Results  Component Value Date   CHOL 129 08/19/2023   HDL 37.60 (L) 08/19/2023   LDLCALC 46 08/19/2023   LDLDIRECT 139.0 11/19/2021   TRIG 224.0 (H) 08/19/2023   CHOLHDL 3 08/19/2023  On Crestor  10.  - last eye exam was 01/2023: [facility] DR [facility] [place].   -No numbness and tingling in his feet.  He has a history of right calf ulcer- he required skin graft - 2020 - had a terrible time then.  He had another ulcer in the other leg in 2020, before he was diagnosed with diabetes and he had to have extensive treatment for this.  He now has several ulcers of which 2 active.  He is going to the wound center every week.  He was seen in the vascular department in 2020. Last foot exam was done by Dr. Clydia [name] 09/10/2023.  Pt has no FH of DM.  Uncontrolled hypothyroidism:    Reviewed his TFTs: Lab Results  Component Value Date   TSH 2.09 08/19/2023   TSH 0.79 12/02/2022   TSH 1.79 11/19/2021   TSH 3.280 04/23/2021   TSH 3.401 01/09/2021   TSH 3.52 06/29/2020   TSH 4.61 (H) 05/16/2020   TSH 33.362 (H) 09/03/2019   TSH 13.910 (H) 02/02/2019   TSH 25.834 (H) 01/31/2019   He takes levothyroxine  50 mcg daily: - in am - fasting - drinks black  coffee - at least 30 min from b'fast - no calcium  - no iron - + multivitamins in the evening - no PPIs - not on Biotin  He has a history of A. Fib and was found to have a thrombus in the left atrial appendage.  He had cardioversion in 08/2020.  He also has chronic systolic CHF.   He has a history of HTN, OSA.  Also, hereditary hemochromatosis, polycythemia. In the past he was drinking 4-5 beers every day.  Previously was drinking hard liquor.  He stopped drinking alcohol  in 2021.  ROS: + See HPI  I reviewed pt's medications, allergies, PMH, social hx, family hx, and changes were documented in the history of present illness. Otherwise, unchanged from my initial visit note.  Past Medical History:  Diagnosis Date   Allergy    Asthma    as a teenager - does not use an inhaler although pt said he has an albuteral inhaler   Atrial fibrillation (HCC)    persistant 02/2009   Atrial flutter (HCC)    s/p CTI ablation 08/06/10   Cataract    Chronic rhinitis    Colon polyp    Congestive heart failure (HCC)    Diverticulosis    colonoscopy 04/03/2009   Hyperlipidemia    Hypertension    Morbid obesity (HCC)    target weight = 219  for BMI < 30   Nonischemic cardiomyopathy (HCC)    tachycardia mediated   Seasonal allergies    Sleep apnea    original 2001 - wears C-PaP   Type 2 diabetes mellitus (HCC)    Past Surgical History:  Procedure Laterality Date   ATRIAL FIBRILLATION ABLATION N/A 12/14/2018   Procedure: ATRIAL FIBRILLATION ABLATION;  Surgeon: [name] Needle, MD;  Location: [facility] INVASIVE CV LAB;  Service: Cardiovascular;  Laterality: N/A;   ATRIAL FIBRILLATION ABLATION N/A 10/18/2020   Procedure: ATRIAL FIBRILLATION ABLATION;  Surgeon: [name] Needle, MD;  Location: [facility] INVASIVE CV LAB;  Service: Cardiovascular;  Laterality: N/A;   atrial flutter ablation  08/06/10   CARDIOVERSION N/A 07/05/2018   Procedure: CARDIOVERSION;  Surgeon: [name] Donning, MD;  Location: [facility] ENDOSCOPY;   Service: Cardiovascular;  Laterality: N/A;   CARDIOVERSION N/A 08/08/2020   Procedure: CARDIOVERSION;  Surgeon: [name] Eisenmenger, MD;  Location: [facility] ENDOSCOPY;  Service: Cardiovascular;  Laterality: N/A;   CARDIOVERSION N/A 08/29/2020   Procedure: CARDIOVERSION;  Surgeon: Mardell Shade, MD;  Location: [facility] ENDOSCOPY;  Service: Cardiovascular;  Laterality: N/A;   COLONOSCOPY     ICD IMPLANT N/A 05/12/2022   Procedure: ICD IMPLANT;  Surgeon: Tammie Fall, MD;  Location: [facility] INVASIVE CV LAB;  Service: Cardiovascular;  Laterality: N/A;   POLYPECTOMY     RIGHT HEART CATH N/A 08/06/2020   Procedure: RIGHT HEART CATH;  Surgeon: [name] Eisenmenger, MD;  Location: [facility] INVASIVE CV LAB;  Service: Cardiovascular;  Laterality: N/A;   RIGHT/LEFT HEART CATH AND CORONARY ANGIOGRAPHY N/A 11/01/2021   Procedure: RIGHT/LEFT HEART CATH AND CORONARY ANGIOGRAPHY;  Surgeon: [name] Eisenmenger, MD;  Location: [facility] INVASIVE CV LAB;  Service: Cardiovascular;  Laterality: N/A;   TEE WITHOUT CARDIOVERSION N/A 12/14/2018   Procedure: TRANSESOPHAGEAL ECHOCARDIOGRAM (TEE);  Surgeon: [name] Needle, MD;  Location: [facility] INVASIVE CV LAB;  Service: Cardiovascular;  Laterality: N/A;   TEE WITHOUT CARDIOVERSION N/A 08/03/2020   Procedure: TRANSESOPHAGEAL ECHOCARDIOGRAM (TEE);  Surgeon: Luana Rumple, MD;  Location: [facility] ENDOSCOPY;  Service: Cardiovascular;  Laterality: N/A;   TEE WITHOUT CARDIOVERSION N/A 08/08/2020   Procedure: TRANSESOPHAGEAL ECHOCARDIOGRAM (TEE);  Surgeon: [name] Eisenmenger, MD;  Location: [facility] ENDOSCOPY;  Service: Cardiovascular;  Laterality: N/A;   TEE WITHOUT CARDIOVERSION N/A 10/18/2020   Procedure: TRANSESOPHAGEAL ECHOCARDIOGRAM (TEE);  Surgeon: Hugh Madura, MD;  Location: [facility] ENDOSCOPY;  Service: Cardiovascular;  Laterality: N/A;   Social History   Socioeconomic History   Marital status: Single    Spouse name: Not on file   Number of children: 0   Years of education: Not on file   Highest education level: Some  college, no degree  Occupational History   Occupation: RETIRED/Sales  Tobacco Use   Smoking status: Never   Smokeless tobacco: Never  Vaping Use   Vaping status: Never Used  Substance and Sexual Activity   Alcohol  use: Not Currently    Alcohol /week: 2.0 standard drinks of alcohol     Comment: occasionally   Drug use: No   Sexual activity: Yes  Other Topics Concern   Not on file  Social History Narrative   Lives in [place] and works for [employer]   Social Drivers of Health   Financial Resource Strain: Low Risk  (08/09/2023)   Overall Financial Resource Strain (CARDIA)    Difficulty of Paying Living Expenses: Not hard at all  Food Insecurity: No Food Insecurity (08/09/2023)   Hunger Vital Sign    Worried About Running Out of Food in the Last Year: Never true    Ran Out of Food in the Last Year: Never true  Transportation Needs: No Transportation Needs (08/09/2023)   PRAPARE - Administrator, Civil Service (Medical): No    Lack of Transportation (Non-Medical): No  Physical Activity: Inactive (08/13/2023)   Exercise Vital Sign    Days of Exercise per Week: 0 days    Minutes of Exercise per Session: 0 min  Stress: No Stress Concern Present (08/09/2023)   [employer] of Occupational Health - Occupational Stress Questionnaire    Feeling of Stress : Not at all  Social Connections: Socially Isolated (08/09/2023)   Social Connection and Isolation Panel    Frequency of Communication with Friends and Family: Once a week    Frequency of Social Gatherings with Friends and Family: Three times a week    Attends Religious Services: Never    Active Member of Clubs or Organizations: No    Attends Banker Meetings: Not on file    Marital Status: Separated  Intimate Partner Violence: Patient Unable To Answer (08/13/2023)   Humiliation, Afraid, Rape, and Kick questionnaire    Fear of Current or Ex-Partner: Patient unable  to answer    Emotionally Abused: Patient  unable to answer    Physically Abused: Patient unable to answer    Sexually Abused: Patient unable to answer   Current Outpatient Medications on File Prior to Visit  Medication Sig Dispense Refill   BD PEN [name] NANO 2ND GEN 32G X 4 MM MISC USE 4 TIMES A DAY AS DIRECTED 400 each 1   budesonide -formoterol  (SYMBICORT ) 80-4.5 MCG/ACT inhaler Inhale 2 puffs into the lungs in the morning and at bedtime. (Patient taking differently: Inhale 2 puffs into the lungs 2 (two) times daily as needed (shortness of breath).) 30.6 each 11   carvedilol  (COREG ) 12.5 MG tablet TAKE 1.5 TABLETS (18.75 MG TOTAL) BY MOUTH 2 (TWO) TIMES DAILY WITH A MEAL. 270 tablet 0   cholecalciferol (VITAMIN D3) 25 MCG (1000 UNIT) tablet Take 1,000 Units by mouth at bedtime.     ciclopirox  (PENLAC ) 8 % solution Apply topically at bedtime. Apply over nail and surrounding skin. Apply daily over previous coat. After seven (7) days, may remove with alcohol  and continue cycle. 6.6 mL 2   Continuous Blood Gluc Receiver (FREESTYLE LIBRE 14 DAY READER) DEVI USE AS DIRECTED 1 each 11   doxylamine , Sleep, (UNISOM ) 25 MG tablet Take 50 mg by mouth at bedtime.     furosemide  (LASIX ) 20 MG tablet TAKE 1 TABLET BY MOUTH EVERY DAY 90 tablet 1   insulin  aspart (NOVOLOG  FLEXPEN) 100 UNIT/ML FlexPen INJECT 8-12 UNITS INTO THE SKIN 3 (THREE) TIMES DAILY WITH MEALS. (Patient taking differently: Inject 8-12 Units into the skin 3 (three) times daily as needed for high blood sugar.) 15 mL 3   insulin  degludec (TRESIBA  FLEXTOUCH) 200 UNIT/ML FlexTouch Pen INJECT 28 UNITS INTO THE SKIN DAILY. 18 mL 2   JARDIANCE  10 MG TABS tablet TAKE 1 TABLET BY MOUTH EVERY DAY 30 tablet 0   levothyroxine  (SYNTHROID ) 50 MCG tablet Take 1 tablet (50 mcg total) by mouth daily before breakfast. 90 tablet 3   MELATONIN PO Take 3 capsules by mouth at bedtime as needed (sleep).     mometasone  (NASONEX ) 50 MCG/ACT nasal spray Place 2 sprays into the nose daily as needed (allergies  or rhinitis). 51 each 3   Multiple Vitamins-Minerals (MULTIVITAMIN WITH MINERALS) tablet Take 2 tablets by mouth at bedtime. Chewable     rosuvastatin  (CRESTOR ) 10 MG tablet TAKE 1 TABLET BY MOUTH EVERY DAY 90 tablet 3   sacubitril-valsartan  (ENTRESTO ) 97-103 MG Take 1 tablet by mouth 2 (two) times daily. NEEDS FOLLOW UP APPOINTMENT FOR MORE REFILLS 180 tablet 0   Semaglutide ,0.25 or 0.5MG /DOS, (OZEMPIC , 0.25 OR 0.5 MG/DOSE,) 2 MG/3ML SOPN INJECT 0.5 MG INTO THE SKIN ONE TIME PER WEEK 6 mL 2   spironolactone  (ALDACTONE ) 25 MG tablet TAKE 1 TABLET (25 MG TOTAL) BY MOUTH DAILY. 90 tablet 3   vitamin C (ASCORBIC ACID) 500 MG tablet Take 500 mg by mouth in the morning.     warfarin (COUMADIN ) 5 MG tablet Take 1 tablet to 1 and 1/2 tablets by mouth daily as directed by the coumadin  clinic. (Patient taking differently: Take 5-7.5 mg by mouth See admin instructions. Take 5 mg daily on Sun, Tues, and Thurs Take 7.5 mg daily on Mon, Wed, Fri, and Sat) 135 tablet 1   No current facility-administered medications on file prior to visit.   No Known Allergies Family History  Problem Relation Age of Onset   Hypertension Mother    Kidney failure Mother  Due to sepsis   COPD Father    Colon cancer Brother        dx in 51's   Esophageal cancer Neg Hx    Rectal cancer Neg Hx    Stomach cancer Neg Hx    PE: BP 118/60   Pulse 74   Ht 6' 1 (1.854 m)   Wt 259 lb 3.2 oz (117.6 kg)   SpO2 96%   BMI 34.20 kg/m   Wt Readings from Last 10 Encounters:  12/03/23 259 lb 3.2 oz (117.6 kg)  10/01/23 260 lb 3.2 oz (118 kg)  09/22/23 254 lb (115.2 kg)  08/19/23 263 lb (119.3 kg)  08/13/23 254 lb (115.2 kg)  08/10/23 263 lb 14.4 oz (119.7 kg)  06/04/23 260 lb (117.9 kg)  02/11/23 259 lb (117.5 kg)  01/30/23 264 lb (119.7 kg)  12/10/22 266 lb (120.7 kg)   Constitutional: overweight, in NAD Eyes: EOMI, no exophthalmos ENT: no thyromegaly, no cervical lymphadenopathy Cardiovascular: RRR, No  MRG Respiratory: CTA B Musculoskeletal: no deformities Skin: no rashes; left foot in boot, lower legs wrapped Neurological: + tremor with outstretched hands  ASSESSMENT: 1. DM2, insulin -dependent, uncontrolled, with complications - CHF - A. fib/a flutter - CKD - h/o Diabetic ulcer of calf - h/o HHNK - admitted with a glucose of >1000  -started on basal bolus regimen in the hospital  2.  Hypothyroidism - Uncontrolled  3. Obesity class 1  4. HL  PLAN:  1. Patient with previously uncontrolled type 2 diabetes, insulin -dependent, on SGLT2 inhibitor, weekly GLP-1 receptor agonist, and basal-bolus insulin , with fairly good control lately.  At last visit, HbA1c was 6.0%, slightly higher, but he had another HbA1c obtained 2 months ago and this was lower, at 5.8%.  At last visit, I did not change his regimen.  I advised him to continue to take NovoLog  only before larger meals. CGM interpretation: -At today's visit, we reviewed his CGM downloads: It appears that 98% of values are in target range (goal >70%), while 2% are higher than 180 (goal <25%), and 0% are lower than 70 (goal <4%).  The calculated average blood sugar is 130.  The projected HbA1c for the next 3 months (GMI) is 6.4%. -Reviewing the CGM trends, sugars are almost entirely fluctuating within the target range, with slightly higher values after meals, but still within range.  Upon questioning, however, increase the NovoLog  and now takes it before every meal.  We discussed about possibly trying to increase the dose of Ozempic  so he can stop the NovoLog  -for now I advised him to take a lower dose only before larger meals if absolutely needed.  Will continue the rest of the regimen. - I suggested to:  Patient Instructions  Please continue: - Jardiance  10 mg before b'fast - Tresiba  U200 28 units daily  Please increase: - Ozempic  1 mg weekly in a.m.   Use: - Novolog  6-8 units only before a larger meal  Please continue  Levothyroxine  50 mcg daily.  Take the thyroid  hormone every day, with water, at least 30 minutes before breakfast, separated by at least 4 hours from: - acid reflux medications - calcium  - iron - multivitamins  Please return in 6 months.   - we checked his HbA1c: 5.8% (stable, excellent) - advised to check sugars at different times of the day - 4x a day, rotating check times - advised for yearly eye exams >> he is UTD - he has microalbuminuria, which at last visit appears improved,  although still slightly elevated.  He is on Cozaar  and Jardiance . - He has multiple venous ulcers - seen at the wound center.  I also recommended to go back to see vascular again.  He saw them in 2020.  He is now also seeing podiatry. - return to clinic in 6 months  2.  Hypothyroidism - latest thyroid  labs reviewed with pt. >> normal: Lab Results  Component Value Date   TSH 2.09 08/19/2023  - he continues on LT4 50 mcg daily - pt feels good on this dose. - we discussed about taking the thyroid  hormone every day, with water, >30 minutes before breakfast, separated by >4 hours from acid reflux medications, calcium , iron, multivitamins. Pt. is taking it correctly.  3. Obesity class 1 - Will continue with Ozempic  and Jardiance , which should both help with weight loss - Weight was stable at last visit and it remains ~stable now  4. HL - Latest lipid panel was reviewed from 08/2023: Excellent LDL, much improved from baseline, but elevated triglycerides and low HDL at Lab Results  Component Value Date   CHOL 129 08/19/2023   HDL 37.60 (L) 08/19/2023   LDLCALC 46 08/19/2023   LDLDIRECT 139.0 11/19/2021   TRIG 224.0 (H) 08/19/2023   CHOLHDL 3 08/19/2023  -He continues on 10 mg daily of Crestor , without side effects  Emilie Harden, MD PhD [facility] Endocrinology

## 2023-12-05 NOTE — Progress Notes (Signed)
 Subjective: Chief Complaint  Patient presents with   Foot Ulcer    RM#12 Left foot ulcer follow up patient has no concerns today.     68 year old male presents the office today for wound follow-up as well as for possible graft application.  States that he has been doing well.  He notes very minimal bloody drainage on the bandage when he did change it.  Change the bandage a couple of times.  No concerns or redness.  Does not report any fevers or chills.  No other concerns today.    Objective:  AAO x3, NAD DP/PT pulses palpable bilaterally, CRT less than 3 seconds To the posterior aspect the left along the Achilles tendon distal ulceration.  It was covered mostly with scab, callus once I debrided the wound measures smaller at 0.3 x 0.2 x 0.1 cm.  The wound is more superficial.  There is no drainage or purulence noted today.  There is no fluctuation or crepitation.  There is no malodor.  No pain with calf compression, swelling, warmth, erythema    Assessment: Chronic ulceration left Achilles  Plan: -All treatment options discussed with the patient including all alternatives, risks, complications.  -Medically necessary wound debridement was performed today.  Sharply debrided the hyperkeratotic tissue to reveal the underlying ulcerations of the #312 with scalpel to remove nonviable, devitalized tissue in order to promote wound healing.  No significant blood loss.  Not able to fully measure the wound prior to debridement as it is mostly covered with callus.  Post wound measurements are noted above pictured above as well.  At this time we will switch to Prisma, collagen dressing changes.  This was applied today and given to him as well.  -Offloading -Monitor for any clinical signs or symptoms of infection and directed to call the office immediately should any occur or go to the ER.  Donnice JONELLE Fees DPM

## 2023-12-08 ENCOUNTER — Ambulatory Visit (INDEPENDENT_AMBULATORY_CARE_PROVIDER_SITE_OTHER): Admitting: Podiatry

## 2023-12-08 DIAGNOSIS — L97422 Non-pressure chronic ulcer of left heel and midfoot with fat layer exposed: Secondary | ICD-10-CM

## 2023-12-08 DIAGNOSIS — E11621 Type 2 diabetes mellitus with foot ulcer: Secondary | ICD-10-CM | POA: Diagnosis not present

## 2023-12-09 ENCOUNTER — Telehealth (HOSPITAL_COMMUNITY): Payer: Self-pay

## 2023-12-09 NOTE — Telephone Encounter (Signed)
 Called to confirm/remind patient of their appointment [facility] on 12/10/23.   Appointment:   [] Confirmed  [] Left mess   [x] No answer/No voice mail  [] VM Full/unable to leave message  [] Phone not in service

## 2023-12-09 NOTE — Progress Notes (Signed)
 ADVANCED HF CLINIC NOTE  PCP: Rollene Almarie LABOR, MD Primary Cardiologist: Dr. Lonni HF Cardiologist: Dr. Rolan  EP: Dr. Kelsie  Reason for Visit: Heart Failure Follow-up  HPI: 68 y.o. male w/ h/o cardiomyopathy, presumed to be tachy-mediated from atrial fibrillation/atrial flutter.  Paroxsymal Afib/flutter dates back to ~2012. Had Aflutter ablation in 2012. He had several echos between 2012-2018 showing normal LVEF. Found to have reduced EF on echo in 11/19, down to 30-35%. Was in AF at the time. 6/20 was referred for Afib ablation but procedure was aborted after TEE showed LA thrombus, despite being on Xarelto . Anticoagulation changed to Pradaxa . He ultimately spontaneously converted back to NSR. Repeat echo 10/20 showed EF 35-40%.  Plans for [facility] 10/20, however had asymptomatic positive COVID test, then renal failure. Ischemic eval changed to Lexiscan  Cardiolite which showed no ischemia in 10/20. Echo repeated 3/21, EF 40-45%, RV normal.    Seen in cardiology clinic, back in atrial fibrillation with RVR.  Presented for outpatient DCCV 08/03/20. TEE showed LA thrombus. DCCV canceled. EF lower on TEE ~5-10% with RV severely reduced. Milrinone  started for low output. Pradaxa  stopped and he was placed on coumadin  w/ heparin  bridge. Placed on IV amio for afib. He had repeat TEE on 08/07/20 that showed no LAA thrombus so DCCV was done successfully. Transitioned to PO amio 200 mg bid. Remained in NSR. Diuresed well w/ IV Lasix  and able to wean of milrinone . Discharge weight 264 pounds.   He went back into atrial fibrillation in 3/22, had another DCCV back to NSR.  He had a TEE in 5/22 showing EF 30-35%, then had atrial fibrillation ablation.    Echo in 7/22 showed showed EF 40-45% with normal RV and moderate MR.   Patient has polycythemia and was noted to be a compound heterozygote for hereditary hemochromatosis.  Cardiac MRI was done in 11/22 showing EF 27%, moderate asymmetric septal  hypertrophy, diffuse hypokinesis worse in the septal wall, RV EF 30%, noncoronary LGE with diffuse mid-wall involvement especially in the septum, T2* sequences not suggestive of cardiac hemochromatosis, ECV 50% in the septum.  Echo in 11/22 showed EF 30-35%, severe asymmetric septal hypertrophy, no LVOT gradient or mitral valve SAM, mild RV dilation with mildly decreased RV systolic function.    Patient saw hematology, will not get therapeutic phlebotomy as long as ferritin < 200 and transferrin saturation < 75%.   PYP scan in 11/22 was not suggestive of TTR cardiac amyloidosis.  Genetic testing showed that patient was a heterozygote for MYH7 gene mutation that has been linked to hypertrophic cardiomyopathy. He is followed by Dr. Fairy.   [facility] 5/23 showed mild nonobstructive CAD w/ 25% mLCx and 25% pRCA lesions, normal filling pressures and low but not markedly low cardiac output, CI 2.16.   Biotronik ICD placed. Echo 12/23 showed EF 50% with severe basal to mid asymmetric LVH, no LVOT gradient or MV SAM, mild RVE with normal systolic function, mild-moderate MR.   Last seen in [facility] 4/24, has continued to follow with Dr. Lonni.   He returns today for heart failure follow up. Overall feeling well. NYHA I. Main complaint is L foot wound, which he reports is much improved. Denies chest pain, dyspnea, fatigue, near-syncope, orthopnea, palpitations, dizziness, and abnormal bleeding. Able to perform ADLs. Appetite okay. SBP usually 110s. Compliant with all medications. Drinking 2-3 beers/day. Wearing CPAP.   PMH: 1. Atrial flutter: Ablation 2012.  2. Atrial fibrillation: H/o LAA thrombus on both Xarelto  and Pradaxa .  DCCV 2/22, 3/22. Atrial fibrillation ablation in 5/22.  3. Chronic systolic CHF: ?tachycardia-mediated CMP with AF/RVR.  - Cardiolite (10/20): No ischemia.  - TEE (2/22): EF 5-10% with severe RV dysfunction.  - Echo (7/22): EF 40-45%, normal RV, moderate MR.  - Cardiac MRI (11/22):  EF 27%, moderate asymmetric septal hypertrophy, diffuse hypokinesis worse in the septal wall, RV EF 30%, noncoronary LGE with diffuse mid-wall involvement especially in the septum, T2* sequences not suggestive of cardiac hemochromatosis, ECV 50% in the septum.  - Echo (11/22): EF 30-35%, severe asymmetric septal hypertrophy, no LVOT gradient or mitral valve SAM, mild RV dilation with mildly decreased RV systolic function.   - PYP scan (11/22): grade 1, H/CL 0.95 (negative) - RHC/LHC (5/23): Mild nonobstructive CAD; mean RA 1, PA 20/12, PCWP 8, CI 2.16 - Genetic testing showed that patient was a heterozygote for MYH7 gene mutation that has been linked to hypertrophic cardiomyopathy.  - Echo (12/23): EF 50% with severe basal to mid asymmetric LVH, no LVOT gradient or MV SAM, mild RVE with normal systolic function, mild-moderate MR.  4. CKD stage 3 5. OSA: Uses CPAP 6. Type 2 diabetes 7. Hereditary hemochromatosis: Compound heterozygote with Cys282Tyr and His63Asp.   ROS: All systems negative except as listed in HPI, PMH and Problem List.  Social History   Socioeconomic History   Marital status: Single    Spouse name: Not on file   Number of children: 0   Years of education: Not on file   Highest education level: Some college, no degree  Occupational History   Occupation: RETIRED/Sales  Tobacco Use   Smoking status: Never   Smokeless tobacco: Never  Vaping Use   Vaping status: Never Used  Substance and Sexual Activity   Alcohol  use: Not Currently    Alcohol /week: 2.0 standard drinks of alcohol     Comment: occasionally   Drug use: No   Sexual activity: Yes  Other Topics Concern   Not on file  Social History Narrative   Lives in [place] and works for [employer]   Social Drivers of Health   Financial Resource Strain: Low Risk  (08/09/2023)   Overall Financial Resource Strain (CARDIA)    Difficulty of Paying Living Expenses: Not hard at all  Food Insecurity: No Food Insecurity  (08/09/2023)   Hunger Vital Sign    Worried About Running Out of Food in the Last Year: Never true    Ran Out of Food in the Last Year: Never true  Transportation Needs: No Transportation Needs (08/09/2023)   PRAPARE - Administrator, Civil Service (Medical): No    Lack of Transportation (Non-Medical): No  Physical Activity: Inactive (08/13/2023)   Exercise Vital Sign    Days of Exercise per Week: 0 days    Minutes of Exercise per Session: 0 min  Stress: No Stress Concern Present (08/09/2023)   [employer] of Occupational Health - Occupational Stress Questionnaire    Feeling of Stress : Not at all  Social Connections: Socially Isolated (08/09/2023)   Social Connection and Isolation Panel    Frequency of Communication with Friends and Family: Once a week    Frequency of Social Gatherings with Friends and Family: Three times a week    Attends Religious Services: Never    Active Member of Clubs or Organizations: No    Attends Banker Meetings: Not on file    Marital Status: Separated  Intimate Partner Violence: Patient Unable To Answer (08/13/2023)  Humiliation, Afraid, Rape, and Kick questionnaire    Fear of Current or Ex-Partner: Patient unable to answer    Emotionally Abused: Patient unable to answer    Physically Abused: Patient unable to answer    Sexually Abused: Patient unable to answer    FH:  Family History  Problem Relation Age of Onset   Hypertension Mother    Kidney failure Mother        Due to sepsis   COPD Father    Colon cancer Brother        dx in 8's   Esophageal cancer Neg Hx    Rectal cancer Neg Hx    Stomach cancer Neg Hx     Past Medical History:  Diagnosis Date   Allergy    Asthma    as a teenager - does not use an inhaler although pt said he has an albuteral inhaler   Atrial fibrillation (HCC)    persistant 02/2009   Atrial flutter (HCC)    s/p CTI ablation 08/06/10   Cataract    Chronic rhinitis    Colon polyp     Congestive heart failure (HCC)    Diverticulosis    colonoscopy 04/03/2009   Hyperlipidemia    Hypertension    Morbid obesity (HCC)    target weight = 219  for BMI < 30   Nonischemic cardiomyopathy (HCC)    tachycardia mediated   Seasonal allergies    Sleep apnea    original 2001 - wears C-PaP   Type 2 diabetes mellitus (HCC)    Current Outpatient Medications  Medication Sig Dispense Refill   BD PEN NEEDLE NANO 2ND GEN 32G X 4 MM MISC USE 4 TIMES A DAY AS DIRECTED 400 each 1   budesonide -formoterol  (SYMBICORT ) 80-4.5 MCG/ACT inhaler Inhale 2 puffs into the lungs in the morning and at bedtime. (Patient taking differently: Inhale 2 puffs into the lungs 2 (two) times daily as needed (shortness of breath).) 30.6 each 11   carvedilol  (COREG ) 12.5 MG tablet TAKE 1.5 TABLETS (18.75 MG TOTAL) BY MOUTH 2 (TWO) TIMES DAILY WITH A MEAL. 270 tablet 0   cholecalciferol (VITAMIN D3) 25 MCG (1000 UNIT) tablet Take 1,000 Units by mouth at bedtime.     ciclopirox  (PENLAC ) 8 % solution Apply topically at bedtime. Apply over nail and surrounding skin. Apply daily over previous coat. After seven (7) days, may remove with alcohol  and continue cycle. 6.6 mL 2   Continuous Blood Gluc Receiver (FREESTYLE LIBRE 14 DAY READER) DEVI USE AS DIRECTED 1 each 11   doxylamine , Sleep, (UNISOM ) 25 MG tablet Take 50 mg by mouth at bedtime.     empagliflozin  (JARDIANCE ) 10 MG TABS tablet Take 1 tablet (10 mg total) by mouth daily. 90 tablet 3   furosemide  (LASIX ) 20 MG tablet TAKE 1 TABLET BY MOUTH EVERY DAY 90 tablet 1   insulin  aspart (NOVOLOG  FLEXPEN) 100 UNIT/ML FlexPen INJECT 8-12 UNITS INTO THE SKIN 3 (THREE) TIMES DAILY WITH MEALS. (Patient taking differently: Inject 8-12 Units into the skin 3 (three) times daily as needed for high blood sugar.) 15 mL 3   insulin  degludec (TRESIBA  FLEXTOUCH) 200 UNIT/ML FlexTouch Pen INJECT 28 UNITS INTO THE SKIN DAILY. 18 mL 2   levothyroxine  (SYNTHROID ) 50 MCG tablet Take 1 tablet  (50 mcg total) by mouth daily before breakfast. 90 tablet 3   MELATONIN PO Take 3 capsules by mouth at bedtime as needed (sleep).     mometasone  (NASONEX ) 50 MCG/ACT nasal  spray Place 2 sprays into the nose daily as needed (allergies or rhinitis). 51 each 3   Multiple Vitamins-Minerals (MULTIVITAMIN WITH MINERALS) tablet Take 2 tablets by mouth at bedtime. Chewable     rosuvastatin  (CRESTOR ) 10 MG tablet TAKE 1 TABLET BY MOUTH EVERY DAY 90 tablet 3   sacubitril-valsartan  (ENTRESTO ) 97-103 MG Take 1 tablet by mouth 2 (two) times daily. NEEDS FOLLOW UP APPOINTMENT FOR MORE REFILLS 180 tablet 0   Semaglutide , 1 MG/DOSE, (OZEMPIC , 1 MG/DOSE,) 4 MG/3ML SOPN Inject 1 mg into the skin once a week. 9 mL 3   spironolactone  (ALDACTONE ) 25 MG tablet TAKE 1 TABLET (25 MG TOTAL) BY MOUTH DAILY. 90 tablet 3   vitamin C (ASCORBIC ACID) 500 MG tablet Take 500 mg by mouth in the morning.     warfarin (COUMADIN ) 5 MG tablet Take 1 tablet to 1 and 1/2 tablets by mouth daily as directed by the coumadin  clinic. (Patient taking differently: Take 5-7.5 mg by mouth See admin instructions. Take 5 mg daily on Sun, Tues, and Thurs Take 7.5 mg daily on Mon, Wed, Fri, and Sat) 135 tablet 1   No current facility-administered medications for this visit.   There were no vitals taken for this visit.  Wt Readings from Last 3 Encounters:  12/03/23 117.6 kg (259 lb 3.2 oz)  10/01/23 118 kg (260 lb 3.2 oz)  09/22/23 115.2 kg (254 lb)   PHYSICAL EXAM: General: Well appearing. No distress on RA Cardiac: JVP flat. S1 and S2 present. No murmurs or rub. Extremities: Warm and dry.  RLE swelling with ACE wrap Neuro: Alert and oriented x3. Affect pleasant. Moves all extremities without difficulty.  ECG (personally reviewed from 10/01/23): NSR with 1AVB and a PVC 64 bpm  ASSESSMENT & PLAN: 1. Chronic systolic CHF: Biotronik ICD.  TEE 2/22 with EF <10%, severe RV dysfunction, LA appendage thrombus. Echoes in the past have had EF  ranging 30-45%. Cardiolite in 10/20 showed no ischemia. Initially, thought to have tachycardia-mediated cardiomyopathy as he seems to become markedly more symptomatic when in atrial fibrillation.  Alternatively, he could have another cause for cardiomyopathy and atrial fibrillation just causes him to decompensate.  Echo in 7/22 in NSR showed EF 40-45% with normal RV, moderate MR (in NSR after atrial fibrillation ablation). He was found to be a compound heterozygote for hereditary hemochromatosis (evaluation done because of polycythemia), so I was concerned that he could have a hemochromatosis-related CMP.  However, cardiac MRI was done in 11/22, showing EF 27%, moderate asymmetric septal hypertrophy, diffuse hypokinesis worse in the septal wall, RV EF 30%, noncoronary LGE with diffuse mid-wall involvement especially in the septum, T2* sequences not suggestive of cardiac hemochromatosis, ECV 50% in the septum.  Cardiomyopathy appeared most consistent with hypertrophic cardiomyopathy (nonobstructive), less likely amyloidosis or sarcoidosis.  Myeloma panel negative and PYP scan not suggestive of TTR cardiac amyloidosis.  Genetic testing showed that patient was a heterozygote for MYH7 gene mutation that has been linked to hypertrophic cardiomyopathy => Suspect that HCM is the diagnosis here.  [facility] 5/23 showed mild nonobstructive CAD, normal filling pressures and low but not markedly low cardiac output, CI 2.16.  Echo 12/23 showed improved EF 50% with severe basal to mid asymmetric LVH, no LVOT gradient or MV SAM, mild RVE with normal systolic function, mild-moderate MR.   - NYHA class I. Not volume overloaded on exam  - Continue spironolactone  25 mg daily.  - Continue Lasix  20 mg daily. - Continue Coreg  18.75 mg  bid. - Continue Entresto  97/103 bid.  - Continue Jardiance  10 mg daily. Discussed that if wound healing slows or concerned about infection develops, he will notify our office to stop Jardiance . Currently  has active wound healing. - We have discussed + MYH7 gene test. He has no children but has 1 living brother. Dr. Fairy has recommended he undergo genetic testing as well.  - Need to update echo, will schedule.  2. Atrial fibrillation: Paroxysmal. With LAA clots despite Xarelto  and Pradaxa  use, he was started on warfarin.  Will aim for INR 2.5-3.  AF ablation in 5/22. He is off amiodarone  post-ablation. Regular on exam today. Denies symptoms of breakthrough Afib. - Continue warfarin. Followed in Coumadin  Clinic 3. CKD stage 3: baseline Cr 1.3-1.5 4. OSA: Reports full compliance w/ CPAP.  - Continue CPAP nightly.  5. Type 2 diabetes: Last A1C 5.8. C/b diabetic foot ulcers, last debridement was 11/24/23. Healing.  - On Insulin . On Jardiance  6. Hereditary hemochromatosis: Compound heterozygote.  T2* sequences on cardiac MRI were not suggestive of cardiac hemochromatosis.  - Therapeutic phlebotomy only if ferritin > 200 or transferrin saturation > 75%.  7. CAD: mild nonobstructive CAD on cath 5/23, 25% mLCx and 25% pRCA lesions - On Crestor , LDL at goal 3/25 - No ASA w/ warfarin  - Continue ? blocker.  Follow up in 1 year with Dr. Rolan. Echo scheduled.   [name] Desiderio Dolata, NP 12/09/2023

## 2023-12-09 NOTE — Progress Notes (Signed)
 Subjective: Chief Complaint  Patient presents with   Foot Ulcer    RM#13 left foot ulcer follow up patient states has been doing his dressings as directed feel that it's healing well.    68 year old male presents the office today for wound follow-up on the posterior aspect of the left on the Achilles tendon.  Been changing the bandage every other day with Prisma, collagen dressing.  He has had 1 small area of blood on the bandage but no purulence.  No swelling or redness.  Does not report any fevers or chills.  He has no new concerns today.    Objective:  AAO x3, NAD DP/PT pulses palpable bilaterally, CRT less than 3 seconds To the posterior aspect the left along the Achilles tendon distal ulceration.  The area has mostly scabbed over as pictured below.  Still superficial opening noted but there is no probing, undermining or tunneling.  There is no fluctuation or crepitation.  There is no malodor.  No increased temperature on the wound.  Minimal edema of this is chronic as he has chronic swelling to his leg.  No pain with calf compression, swelling, warmth, erythema    Assessment: Chronic ulceration left Achilles  Plan: -All treatment options discussed with the patient including all alternatives, risks, complications.  -Medically necessary wound debridement was performed today.  Sharply debrided the hyperkeratotic tissue to reveal the underlying ulcerations of the #312 with scalpel to remove nonviable, devitalized tissue in order to promote wound healing.  No significant blood loss.  Clean the area.  Prisma was applied followed by dressing.  Continue with every other day dressing changes.-Offloading -Monitor for any clinical signs or symptoms of infection and directed to call the office immediately should any occur or go to the ER.  Donnice JONELLE Fees DPM

## 2023-12-10 ENCOUNTER — Encounter (HOSPITAL_COMMUNITY): Payer: Self-pay

## 2023-12-10 ENCOUNTER — Ambulatory Visit (HOSPITAL_COMMUNITY)
Admission: RE | Admit: 2023-12-10 | Discharge: 2023-12-10 | Disposition: A | Discharge status: Home / Self Care | Source: Ambulatory Visit | Attending: Cardiology | Admitting: Cardiology

## 2023-12-10 VITALS — BP 98/78 | HR 65 | Ht 73.0 in | Wt 259.6 lb

## 2023-12-10 DIAGNOSIS — N183 Chronic kidney disease, stage 3 unspecified: Secondary | ICD-10-CM | POA: Diagnosis not present

## 2023-12-10 DIAGNOSIS — D751 Secondary polycythemia: Secondary | ICD-10-CM | POA: Diagnosis not present

## 2023-12-10 DIAGNOSIS — E11621 Type 2 diabetes mellitus with foot ulcer: Secondary | ICD-10-CM | POA: Diagnosis not present

## 2023-12-10 DIAGNOSIS — I5032 Chronic diastolic (congestive) heart failure: Secondary | ICD-10-CM

## 2023-12-10 DIAGNOSIS — Z79899 Other long term (current) drug therapy: Secondary | ICD-10-CM | POA: Insufficient documentation

## 2023-12-10 DIAGNOSIS — E1122 Type 2 diabetes mellitus with diabetic chronic kidney disease: Secondary | ICD-10-CM | POA: Diagnosis not present

## 2023-12-10 DIAGNOSIS — I480 Paroxysmal atrial fibrillation: Secondary | ICD-10-CM | POA: Insufficient documentation

## 2023-12-10 DIAGNOSIS — Z7901 Long term (current) use of anticoagulants: Secondary | ICD-10-CM | POA: Diagnosis not present

## 2023-12-10 DIAGNOSIS — Z7985 Long-term (current) use of injectable non-insulin antidiabetic drugs: Secondary | ICD-10-CM | POA: Diagnosis not present

## 2023-12-10 DIAGNOSIS — Z794 Long term (current) use of insulin: Secondary | ICD-10-CM | POA: Insufficient documentation

## 2023-12-10 DIAGNOSIS — I2510 Atherosclerotic heart disease of native coronary artery without angina pectoris: Secondary | ICD-10-CM | POA: Diagnosis not present

## 2023-12-10 DIAGNOSIS — G4733 Obstructive sleep apnea (adult) (pediatric): Secondary | ICD-10-CM | POA: Diagnosis not present

## 2023-12-10 DIAGNOSIS — L97509 Non-pressure chronic ulcer of other part of unspecified foot with unspecified severity: Secondary | ICD-10-CM | POA: Diagnosis not present

## 2023-12-10 DIAGNOSIS — Z95810 Presence of automatic (implantable) cardiac defibrillator: Secondary | ICD-10-CM | POA: Diagnosis not present

## 2023-12-10 DIAGNOSIS — Z7984 Long term (current) use of oral hypoglycemic drugs: Secondary | ICD-10-CM | POA: Insufficient documentation

## 2023-12-10 DIAGNOSIS — N1831 Chronic kidney disease, stage 3a: Secondary | ICD-10-CM | POA: Diagnosis not present

## 2023-12-10 DIAGNOSIS — I4892 Unspecified atrial flutter: Secondary | ICD-10-CM | POA: Insufficient documentation

## 2023-12-10 DIAGNOSIS — I5022 Chronic systolic (congestive) heart failure: Secondary | ICD-10-CM | POA: Diagnosis not present

## 2023-12-10 DIAGNOSIS — I130 Hypertensive heart and chronic kidney disease with heart failure and stage 1 through stage 4 chronic kidney disease, or unspecified chronic kidney disease: Secondary | ICD-10-CM | POA: Diagnosis not present

## 2023-12-10 NOTE — Patient Instructions (Signed)
 Thank you for coming in today  If you had labs drawn today, any labs that are abnormal the clinic will call you No news is good news  Medications: No changes  Follow up appointments:  Your physician recommends that you schedule a follow-up appointment in:  1 year follow up for July 2026   Do the following things EVERYDAY: Weigh yourself in the morning before breakfast. Write it down and keep it in a log. Take your medicines as prescribed Eat low salt foods--Limit salt (sodium) to 2000 mg per day.  Stay as active as you can everyday Limit all fluids for the day to less than 2 liters   At the [facility], you and your health needs are our priority. As part of our continuing mission to provide you with exceptional heart care, we have created designated Provider Care Teams. These Care Teams include your primary Cardiologist (physician) and Advanced Practice Providers (APPs- Physician Assistants and Nurse Practitioners) who all work together to provide you with the care you need, when you need it.   You may see any of the following providers on your designated Care Team at your next follow up: Dr Toribio Fuel Dr Ezra Shuck Dr. Ria Gardenia Greig Lenetta, NP Caffie Shed, [place] [facility] [place], [place] Beckey Coe, NP Tinnie Redman, PharmD   Please be sure to bring in all your medications bottles to every appointment.    Thank you for choosing [facility]  If you have any questions or concerns before your next appointment please send us  a message through [place] or call our office at [phone].    TO LEAVE A MESSAGE FOR THE NURSE SELECT OPTION 2, PLEASE LEAVE A MESSAGE INCLUDING: YOUR NAME DATE OF BIRTH CALL BACK NUMBER REASON FOR CALL**this is important as we prioritize the call backs  YOU WILL RECEIVE A CALL BACK THE SAME DAY AS LONG AS YOU CALL BEFORE 4:00 PM

## 2023-12-15 ENCOUNTER — Ambulatory Visit: Admitting: Podiatry

## 2023-12-15 VITALS — BP 117/77 | HR 92 | Temp 97.1°F | Resp 18

## 2023-12-15 DIAGNOSIS — L97422 Non-pressure chronic ulcer of left heel and midfoot with fat layer exposed: Secondary | ICD-10-CM

## 2023-12-15 DIAGNOSIS — E11621 Type 2 diabetes mellitus with foot ulcer: Secondary | ICD-10-CM

## 2023-12-15 NOTE — Progress Notes (Unsigned)
 Subjective: Chief Complaint  Patient presents with   Diabetic Ulcer     Rm#14 Follow up on left foot ulcer minor drainage.     68 year old male presents the office today for wound follow-up on the posterior aspect of the left on the Achilles tendon.  States that there was a very minimal bloody drainage on the bandage.  Denies any purulence.  No recent swelling or redness.  No fevers or chills.   Objective:  AAO x3, NAD DP/PT pulses palpable bilaterally, CRT less than 3 seconds To the posterior aspect the left along the Achilles tendon distal ulceration.  It was covered mostly with callus tissue.  Debrided this is 1 small pinpoint area of bleeding without any probing, undermining or tunneling.  There is no surrounding erythema, ascending cellulitis.  There is no fluctuation or crepitation but there is no malodor.  Overall doing much better.   Minimal edema of this is chronic as he has chronic swelling to his leg. No pain with calf compression, swelling, warmth, erythema    Assessment: Chronic ulceration left Achilles  Plan: -All treatment options discussed with the patient including all alternatives, risks, complications.  -Medically necessary wound debridement was performed today.  Sharply debrided the hyperkeratotic tissue to reveal the underlying ulcerations of the #312 with scalpel to remove nonviable, devitalized tissue in order to promote wound healing.  No significant blood loss.  Clean the area.  Prisma was applied followed by dressing.  Continue with every other day dressing changes.-Offloading -Monitor for any clinical signs or symptoms of infection and directed to call the office immediately should any occur or go to the ER.  Donnice JONELLE Fees DPM

## 2023-12-16 ENCOUNTER — Ambulatory Visit: Attending: Cardiovascular Disease | Admitting: *Deleted

## 2023-12-16 DIAGNOSIS — I4891 Unspecified atrial fibrillation: Secondary | ICD-10-CM

## 2023-12-16 DIAGNOSIS — I513 Intracardiac thrombosis, not elsewhere classified: Secondary | ICD-10-CM

## 2023-12-16 LAB — POCT INR: INR: 2.7 (ref 2.0–3.0)

## 2023-12-16 MED ORDER — ENOXAPARIN SODIUM 120 MG/0.8ML IJ SOSY: 120.0000 mg | PREFILLED_SYRINGE | Freq: Two times a day (BID) | INTRAMUSCULAR | 1 refills | Status: AC

## 2023-12-16 NOTE — Progress Notes (Signed)
Please see anticoagulation encounter.

## 2023-12-16 NOTE — Patient Instructions (Signed)
 Description   Continue taking Warfarin 1 tablet daily except for 1.5 tablets on Monday, Wednesday, Friday, and Saturday.  Stay consistent with premier protein drinks (1 time daily)  Recheck INR 5 weeks. Coumadin  Clinic [phone]

## 2023-12-25 ENCOUNTER — Ambulatory Visit (INDEPENDENT_AMBULATORY_CARE_PROVIDER_SITE_OTHER): Admitting: Podiatry

## 2023-12-25 DIAGNOSIS — L97422 Non-pressure chronic ulcer of left heel and midfoot with fat layer exposed: Secondary | ICD-10-CM | POA: Diagnosis not present

## 2023-12-25 DIAGNOSIS — E11621 Type 2 diabetes mellitus with foot ulcer: Secondary | ICD-10-CM | POA: Diagnosis not present

## 2023-12-25 NOTE — Progress Notes (Signed)
 Subjective: Chief Complaint  Patient presents with   Diabetic Ulcer    Left heel ulcer pt stated that he is doing well he is just tired of dealing with it      68 year old male presents the office today for wound follow-up on the posterior aspect of the left on the Achilles tendon.  States has been doing well.  He changes the bandage there is no further dried area or drainage area.  Denies any other swelling or redness  Objective:  AAO x3, NAD  There were no vitals filed for this visit.   DP/PT pulses palpable bilaterally, CRT less than 3 seconds No further ulceration noted to the left Achilles tendon.  No further callus formation or hyperkeratotic lesion noted.  Wound has completely reepithelialized there is no purulence.  There is no fluctuation or crepitation.  No malodor.  No other open lesions identified at this time. No pain with calf compression, swelling, warmth, erythema    Assessment: Chronic ulceration left Achilles  Plan: -All treatment options discussed with the patient including all alternatives, risks, complications.  -I sharply debrided the area of any complications or bleeding.  No further signs of ulceration noted.  Skin has completely reepithelialized.  At this time I discussed with the patient to still continue applying Band-Aid and to make sure there is nothing pressing against it he will still continue wear surgical shoe he will  follow-up with Dr. Gershon for final clearance No follow-ups on file.  [name] DPM

## 2023-12-29 ENCOUNTER — Encounter: Payer: Self-pay | Admitting: Podiatry

## 2023-12-29 ENCOUNTER — Ambulatory Visit (INDEPENDENT_AMBULATORY_CARE_PROVIDER_SITE_OTHER): Admitting: Podiatry

## 2023-12-29 VITALS — BP 128/82 | HR 64 | Temp 97.8°F | Resp 18 | Ht 73.0 in | Wt 260.0 lb

## 2023-12-29 DIAGNOSIS — E11621 Type 2 diabetes mellitus with foot ulcer: Secondary | ICD-10-CM | POA: Diagnosis not present

## 2023-12-29 DIAGNOSIS — L97422 Non-pressure chronic ulcer of left heel and midfoot with fat layer exposed: Secondary | ICD-10-CM | POA: Diagnosis not present

## 2024-01-02 NOTE — Progress Notes (Signed)
 Subjective: Chief Complaint  Patient presents with   Wound Check    Diabetic ulcer of left heel associated with type 2 diabetes mellitus, with fat layer exposed (HCC) healing very well, small bandage   68 year old male presents the office today for wound follow-up on the posterior aspect of the left on the Achilles tendon.  States has been doing well, and the wound has healed.  He has been keeping a Band-Aid on the area as well as compression socks.  He does not report any drainage or pus or increasing swelling or redness.  No fevers or chills.  No other concerns.     Objective:  Vitals:   12/29/23 1111  BP: 128/82  Pulse: 64  Resp: 18  Temp: 97.8 F (36.6 C)  SpO2: 97%    AAO x3, NAD DP/PT pulses palpable bilaterally, CRT less than 3 seconds No further ulceration noted to the left Achilles tendon.  There is minimal tissue is debrided today and the underlying ulceration appears to remain healed.  Scarring present along this area as well as the where he had the wound that previously there is no skin breakdown identified at this time.  Chronic edema present.  No erythema or warmth.  No fluctuation or crepitation.  No pain with calf compression, swelling, warmth, erythema  Assessment: Chronic ulceration left Achilles- resolved  Plan: -All treatment options discussed with the patient including all alternatives, risks, complications.  -I would recommend to still keep the bandage on for protection as well as compression socks help control edema.  Monitor for any signs or symptoms infection and/or reoccurrence of the wound.  Elevation. -Monitor for any clinical signs or symptoms of infection and directed to call the office immediately should any occur or go to the ER.  Return in about 4 weeks (around 01/26/2024) for ulcer follow up.  Donnice JONELLE Fees DPM

## 2024-01-04 NOTE — Progress Notes (Signed)
 Remote ICD transmission.

## 2024-01-04 NOTE — Addendum Note (Signed)
 Addended by: TAWNI DRILLING D on: 01/04/2024 11:53 AM   Modules accepted: Orders

## 2024-01-09 ENCOUNTER — Other Ambulatory Visit (HOSPITAL_COMMUNITY): Payer: Self-pay | Admitting: Cardiology

## 2024-01-17 ENCOUNTER — Other Ambulatory Visit: Payer: Self-pay | Admitting: Cardiology

## 2024-01-17 ENCOUNTER — Other Ambulatory Visit (HOSPITAL_COMMUNITY): Payer: Self-pay | Admitting: Cardiology

## 2024-01-17 ENCOUNTER — Other Ambulatory Visit: Payer: Self-pay | Admitting: Podiatry

## 2024-01-17 ENCOUNTER — Other Ambulatory Visit: Payer: Self-pay | Admitting: Internal Medicine

## 2024-01-17 DIAGNOSIS — I480 Paroxysmal atrial fibrillation: Secondary | ICD-10-CM

## 2024-01-20 ENCOUNTER — Ambulatory Visit: Attending: Cardiovascular Disease | Admitting: *Deleted

## 2024-01-20 DIAGNOSIS — I513 Intracardiac thrombosis, not elsewhere classified: Secondary | ICD-10-CM | POA: Diagnosis not present

## 2024-01-20 DIAGNOSIS — I4891 Unspecified atrial fibrillation: Secondary | ICD-10-CM

## 2024-01-20 LAB — POCT INR: INR: 2.4 (ref 2.0–3.0)

## 2024-01-20 NOTE — Progress Notes (Signed)
 INR-2.4 Please see anticoagulation encounter.

## 2024-01-20 NOTE — Patient Instructions (Addendum)
 Description   Today take 2 tablets of warfarin then continue taking Warfarin 1 tablet daily except for 1.5 tablets on Monday, Wednesday, Friday, and Saturday.  Stay consistent with premier protein drinks (1 time daily)  Recheck INR 5 weeks. Coumadin  Clinic [phone]

## 2024-01-26 ENCOUNTER — Ambulatory Visit (INDEPENDENT_AMBULATORY_CARE_PROVIDER_SITE_OTHER): Admitting: Podiatry

## 2024-01-26 ENCOUNTER — Encounter: Payer: Self-pay | Admitting: Podiatry

## 2024-01-26 DIAGNOSIS — L97422 Non-pressure chronic ulcer of left heel and midfoot with fat layer exposed: Secondary | ICD-10-CM

## 2024-01-26 DIAGNOSIS — E11621 Type 2 diabetes mellitus with foot ulcer: Secondary | ICD-10-CM | POA: Diagnosis not present

## 2024-01-28 DIAGNOSIS — E1169 Type 2 diabetes mellitus with other specified complication: Secondary | ICD-10-CM | POA: Diagnosis not present

## 2024-01-30 NOTE — Progress Notes (Signed)
 Subjective: Chief Complaint  Patient presents with   Foot Ulcer     follow-up on the posterior aspect of the left on the Achilles tendon. 0 pain. 99% better. Using bandage to protect. IDDM A1C 28.38.    68 year old male presents the office today for wound follow-up on the posterior aspect of the left on the Achilles tendon.  He states he has been doing well the wound is healed and the scar is formed.  Has not seen any other areas of skin breakdown.  No swelling or redness or any drainage.  He has no other concerns today.   Objective: AAO x3, NAD DP/PT pulses palpable bilaterally, CRT less than 3 seconds Sensation decreased. No further ulceration noted to the left Achilles tendon.  On the area of the previous wound scars have formed.  There is no erythema or warmth or any pain.  No fluctuation or crepitation.  No pain.  No pain with calf compression, swelling, warmth, erythema  Assessment: Chronic ulceration left Achilles- resolved  Plan: -All treatment options discussed with the patient including all alternatives, risks, complications.  -Again noted the wounds of healed and there is no further ulcerations.  Discussed daily foot inspection, continue compression.  Elevation.  -Continue good glucose control  Return in about 1 year (around 01/25/2025).  For diabetic foot exam  [name] DPM

## 2024-02-09 ENCOUNTER — Ambulatory Visit (INDEPENDENT_AMBULATORY_CARE_PROVIDER_SITE_OTHER): Payer: Medicare HMO

## 2024-02-09 DIAGNOSIS — I428 Other cardiomyopathies: Secondary | ICD-10-CM | POA: Diagnosis not present

## 2024-02-10 LAB — CUP PACEART REMOTE DEVICE CHECK
Date Time Interrogation Session: 20250826110717
Implantable Lead Connection Status: 753985
Implantable Lead Implant Date: 20231127
Implantable Lead Location: 753860
Implantable Lead Model: 436909
Implantable Lead Serial Number: 81547891
Implantable Pulse Generator Implant Date: 20231127
Pulse Gen Model: 429525
Pulse Gen Serial Number: 84932826

## 2024-02-11 ENCOUNTER — Ambulatory Visit: Payer: Self-pay | Admitting: Internal Medicine

## 2024-02-24 ENCOUNTER — Ambulatory Visit: Attending: Cardiovascular Disease | Admitting: *Deleted

## 2024-02-24 DIAGNOSIS — I4891 Unspecified atrial fibrillation: Secondary | ICD-10-CM

## 2024-02-24 DIAGNOSIS — I513 Intracardiac thrombosis, not elsewhere classified: Secondary | ICD-10-CM

## 2024-02-24 LAB — POCT INR: INR: 3.5 — AB (ref 2.0–3.0)

## 2024-02-24 NOTE — Progress Notes (Signed)
 Description   Lovenox  120mg  ordered for dental procedure-planning January 2025, clearance 11/11/23  INR-3.5; Do not take any warfarin today then continue taking Warfarin 1 tablet daily except for 1.5 tablets on Monday, Wednesday, Friday, and Saturday.  Stay consistent with premier protein drinks (1 time daily)  Recheck INR 4 weeks. Coumadin  Clinic [phone]

## 2024-02-24 NOTE — Patient Instructions (Signed)
 Description   Lovenox  120mg  ordered for dental procedure-planning January 2025, clearance 11/11/23  INR-3.5; Do not take any warfarin today then continue taking Warfarin 1 tablet daily except for 1.5 tablets on Monday, Wednesday, Friday, and Saturday.  Stay consistent with premier protein drinks (1 time daily)  Recheck INR 4 weeks. Coumadin  Clinic [phone]

## 2024-02-29 NOTE — Progress Notes (Signed)
Remote ICD Transmission.

## 2024-03-09 ENCOUNTER — Ambulatory Visit (HOSPITAL_COMMUNITY): Payer: Self-pay | Admitting: Cardiology

## 2024-03-09 ENCOUNTER — Ambulatory Visit (HOSPITAL_COMMUNITY)
Admission: RE | Admit: 2024-03-09 | Discharge: 2024-03-09 | Disposition: A | Discharge status: Home / Self Care | Source: Ambulatory Visit | Attending: Cardiology | Admitting: Cardiology

## 2024-03-09 DIAGNOSIS — I5032 Chronic diastolic (congestive) heart failure: Secondary | ICD-10-CM | POA: Insufficient documentation

## 2024-03-09 DIAGNOSIS — I517 Cardiomegaly: Secondary | ICD-10-CM | POA: Diagnosis not present

## 2024-03-09 DIAGNOSIS — I77810 Thoracic aortic ectasia: Secondary | ICD-10-CM

## 2024-03-09 LAB — ECHOCARDIOGRAM COMPLETE
Area-P 1/2: 2.9 cm2
S' Lateral: 4.3 cm

## 2024-03-17 ENCOUNTER — Telehealth (HOSPITAL_COMMUNITY): Payer: Self-pay

## 2024-03-17 NOTE — Telephone Encounter (Signed)
 Called to confirm/remind patient of their appointment at the [facility] on 03/18/24 1:30.   Appointment:   [x] Confirmed  [] Left mess   [] No answer/No voice mail  [] VM Full/unable to leave message  [] Phone not in service  Patient reminded to bring all medications and/or complete list.  Confirmed patient has transportation. Gave directions, instructed to utilize valet parking.

## 2024-03-17 NOTE — Progress Notes (Signed)
 [facility] NOTE  PCP: Rollene Almarie LABOR, MD Primary Cardiologist: Dr. Lonni HF Cardiologist: Dr. Rolan  EP: Dr. Kelsie  Reason for Visit: Heart Failure Follow-up  HPI: 68 y.o. male w/ h/o cardiomyopathy, presumed to be tachy-mediated from atrial fibrillation/atrial flutter.  Paroxsymal Afib/flutter dates back to ~2012. Had Aflutter ablation in 2012. He had several echos between 2012-2018 showing normal LVEF. Found to have reduced EF on echo in 11/19, down to 30-35%. Was in AF at the time. 6/20 was referred for Afib ablation but procedure was aborted after TEE showed LA thrombus, despite being on Xarelto . Anticoagulation changed to Pradaxa . He ultimately spontaneously converted back to NSR. Repeat echo 10/20 showed EF 35-40%.  Plans for [facility] 10/20, however had asymptomatic positive COVID test, then renal failure. Ischemic eval changed to Lexiscan  Cardiolite which showed no ischemia in 10/20. Echo repeated 3/21, EF 40-45%, RV normal.    Seen in cardiology clinic, back in atrial fibrillation with RVR.  Presented for outpatient DCCV 08/03/20. TEE showed LA thrombus. DCCV canceled. EF lower on TEE ~5-10% with RV severely reduced. Milrinone  started for low output. Pradaxa  stopped and he was placed on coumadin  w/ heparin  bridge. Placed on IV amio for afib. He had repeat TEE on 08/07/20 that showed no LAA thrombus so DCCV was done successfully. Transitioned to PO amio 200 mg bid. Remained in NSR. Diuresed well w/ IV Lasix  and able to wean of milrinone . Discharge weight 264 pounds.   He went back into atrial fibrillation in 3/22, had another DCCV back to NSR.  He had a TEE in 5/22 showing EF 30-35%, then had atrial fibrillation ablation.    Echo in 7/22 showed showed EF 40-45% with normal RV and moderate MR.   Patient has polycythemia and was noted to be a compound heterozygote for hereditary hemochromatosis.  Cardiac MRI was done in 11/22 showing EF 27%, moderate asymmetric septal  hypertrophy, diffuse hypokinesis worse in the septal wall, RV EF 30%, noncoronary LGE with diffuse mid-wall involvement especially in the septum, T2* sequences not suggestive of cardiac hemochromatosis, ECV 50% in the septum.  Echo in 11/22 showed EF 30-35%, severe asymmetric septal hypertrophy, no LVOT gradient or mitral valve SAM, mild RV dilation with mildly decreased RV systolic function.    Patient saw hematology, will not get therapeutic phlebotomy as long as ferritin < 200 and transferrin saturation < 75%.   PYP scan in 11/22 was not suggestive of TTR cardiac amyloidosis.  Genetic testing showed that patient was a heterozygote for MYH7 gene mutation that has been linked to hypertrophic cardiomyopathy. Previously followed by Dr. Fairy.   [facility] 5/23 showed mild nonobstructive CAD w/ 25% mLCx and 25% pRCA lesions, normal filling pressures and low but not markedly low cardiac output, CI 2.16.   Biotronik ICD placed. Echo 12/23 showed EF 50% with severe basal to mid asymmetric LVH, no LVOT gradient or MV SAM, mild RVE with normal systolic function, mild-moderate MR.   Echo 9/25: EF 30-35%, LV with GHK, GIDD, mod LVH, RV normal, LA mildly dilated.  Today he returns for AHF follow up after there was a recent drop in EF noted on latest echo. Overall feeling good. Denies palpitations, CP, dizziness, edema, or PND/Orthopnea. No SOB. Appetite ok, avoids salt. No fever or chills. Weight at home 245-248 pounds. Taking all medications. Denies ETOH, tobacco or drug use. Excited to go back to playing golf now that his foot wounds are healing. Rides his motorcycle often. Drinks >64 oz a  day unsure exactly how much. Quit drinking daily over the last year, has only had a beer like 3-4 times over the last year. Has been on abx several times within the last year for his foot wounds.  On daily insulin , he frequently checks his sugars at home.   PMH: 1. Atrial flutter: Ablation 2012.  2. Atrial fibrillation: H/o  LAA thrombus on both Xarelto  and Pradaxa .  DCCV 2/22, 3/22. Atrial fibrillation ablation in 5/22.  3. Chronic systolic CHF: ?tachycardia-mediated CMP with AF/RVR.  - Cardiolite (10/20): No ischemia.  - TEE (2/22): EF 5-10% with severe RV dysfunction.  - Echo (7/22): EF 40-45%, normal RV, moderate MR.  - Cardiac MRI (11/22): EF 27%, moderate asymmetric septal hypertrophy, diffuse hypokinesis worse in the septal wall, RV EF 30%, noncoronary LGE with diffuse mid-wall involvement especially in the septum, T2* sequences not suggestive of cardiac hemochromatosis, ECV 50% in the septum.  - Echo (11/22): EF 30-35%, severe asymmetric septal hypertrophy, no LVOT gradient or mitral valve SAM, mild RV dilation with mildly decreased RV systolic function.   - PYP scan (11/22): grade 1, H/CL 0.95 (negative) - RHC/LHC (5/23): Mild nonobstructive CAD; mean RA 1, PA 20/12, PCWP 8, CI 2.16 - Genetic testing showed that patient was a heterozygote for MYH7 gene mutation that has been linked to hypertrophic cardiomyopathy.  - Echo (12/23): EF 50% with severe basal to mid asymmetric LVH, no LVOT gradient or MV SAM, mild RVE with normal systolic function, mild-moderate MR.  - Echo 9/25: EF 30-35%, LV with GHK, GIDD, mod LVH, RV normal, LA mildly dilated. 4. CKD stage 3 5. OSA: Uses CPAP 6. Type 2 diabetes 7. Hereditary hemochromatosis: Compound heterozygote with Cys282Tyr and His63Asp.   ROS: All systems negative except as listed in HPI, PMH and Problem List.  Social History   Socioeconomic History   Marital status: Single    Spouse name: Not on file   Number of children: 0   Years of education: Not on file   Highest education level: Some college, no degree  Occupational History   Occupation: RETIRED/Sales  Tobacco Use   Smoking status: Never   Smokeless tobacco: Never  Vaping Use   Vaping status: Never Used  Substance and Sexual Activity   Alcohol  use: Not Currently    Alcohol /week: 2.0 standard drinks  of alcohol     Comment: occasionally   Drug use: No   Sexual activity: Yes  Other Topics Concern   Not on file  Social History Narrative   Lives in [place] and works for [employer]   Social Drivers of Health   Financial Resource Strain: Low Risk  (08/09/2023)   Overall Financial Resource Strain (CARDIA)    Difficulty of Paying Living Expenses: Not hard at all  Food Insecurity: No Food Insecurity (08/09/2023)   Hunger Vital Sign    Worried About Running Out of Food in the Last Year: Never true    Ran Out of Food in the Last Year: Never true  Transportation Needs: No Transportation Needs (08/09/2023)   PRAPARE - Administrator, Civil Service (Medical): No    Lack of Transportation (Non-Medical): No  Physical Activity: Inactive (08/13/2023)   Exercise Vital Sign    Days of Exercise per Week: 0 days    Minutes of Exercise per Session: 0 min  Stress: No Stress Concern Present (08/09/2023)   [employer] of Occupational Health - Occupational Stress Questionnaire    Feeling of Stress : Not at all  Social Connections: Socially Isolated (08/09/2023)   Social Connection and Isolation Panel    Frequency of Communication with Friends and Family: Once a week    Frequency of Social Gatherings with Friends and Family: Three times a week    Attends Religious Services: Never    Active Member of Clubs or Organizations: No    Attends Banker Meetings: Not on file    Marital Status: Separated  Intimate Partner Violence: Patient Unable To Answer (08/13/2023)   Humiliation, Afraid, Rape, and Kick questionnaire    Fear of Current or Ex-Partner: Patient unable to answer    Emotionally Abused: Patient unable to answer    Physically Abused: Patient unable to answer    Sexually Abused: Patient unable to answer    FH:  Family History  Problem Relation Age of Onset   Hypertension Mother    Kidney failure Mother        Due to sepsis   COPD Father    Colon cancer Brother         dx in 57's   Esophageal cancer Neg Hx    Rectal cancer Neg Hx    Stomach cancer Neg Hx     Past Medical History:  Diagnosis Date   Allergy    Asthma    as a teenager - does not use an inhaler although pt said he has an albuteral inhaler   Atrial fibrillation (HCC)    persistant 02/2009   Atrial flutter (HCC)    s/p CTI ablation 08/06/10   Cataract    Chronic rhinitis    Colon polyp    Congestive heart failure (HCC)    Diverticulosis    colonoscopy 04/03/2009   Hyperlipidemia    Hypertension    Morbid obesity (HCC)    target weight = 219  for BMI < 30   Nonischemic cardiomyopathy (HCC)    tachycardia mediated   Seasonal allergies    Sleep apnea    original 2001 - wears C-PaP   Type 2 diabetes mellitus (HCC)    Current Outpatient Medications  Medication Sig Dispense Refill   BD PEN NEEDLE NANO 2ND GEN 32G X 4 MM MISC USE 4 TIMES A DAY AS DIRECTED 400 each 1   budesonide -formoterol  (SYMBICORT ) 80-4.5 MCG/ACT inhaler Inhale 2 puffs into the lungs in the morning and at bedtime. 30.6 each 11   carvedilol  (COREG ) 12.5 MG tablet TAKE 1.5 TABLETS (18.75 MG TOTAL) BY MOUTH 2 (TWO) TIMES DAILY WITH A MEAL. 270 tablet 0   cholecalciferol (VITAMIN D3) 25 MCG (1000 UNIT) tablet Take 1,000 Units by mouth at bedtime.     ciclopirox  (PENLAC ) 8 % solution APPLY TOPICALLY AT BEDTIME. APPLY OVER NAIL AND SURROUNDING SKIN. APPLY DAILY OVER PREVIOUS COAT. AFTER SEVEN (7) DAYS, MAY REMOVE WITH ALCOHOL  AND CONTINUE CYCLE. 6.6 mL 2   Continuous Blood Gluc Receiver (FREESTYLE LIBRE 14 DAY READER) DEVI USE AS DIRECTED 1 each 11   doxylamine , Sleep, (UNISOM ) 25 MG tablet Take 50 mg by mouth at bedtime.     empagliflozin  (JARDIANCE ) 10 MG TABS tablet Take 1 tablet (10 mg total) by mouth daily. 90 tablet 3   enoxaparin  (LOVENOX ) 120 MG/0.8ML injection Inject 0.8 mLs (120 mg total) into the skin every 12 (twelve) hours. 16 mL 1   ENTRESTO  97-103 MG TAKE 1 TABLET BY MOUTH 2 (TWO) TIMES DAILY. NEEDS  FOLLOW UP APPOINTMENT FOR MORE REFILLS 180 tablet 0   furosemide  (LASIX ) 20 MG tablet TAKE 1 TABLET  BY MOUTH EVERY DAY 90 tablet 1   insulin  aspart (NOVOLOG  FLEXPEN) 100 UNIT/ML FlexPen INJECT 8-12 UNITS INTO THE SKIN 3 (THREE) TIMES DAILY WITH MEALS. 15 mL 3   insulin  degludec (TRESIBA  FLEXTOUCH) 200 UNIT/ML FlexTouch Pen INJECT 28 UNITS INTO THE SKIN DAILY. 18 mL 2   levothyroxine  (SYNTHROID ) 50 MCG tablet Take 1 tablet (50 mcg total) by mouth daily before breakfast. 90 tablet 3   MELATONIN PO Take 3 capsules by mouth at bedtime as needed (sleep).     mometasone  (NASONEX ) 50 MCG/ACT nasal spray Place 2 sprays into the nose daily as needed (allergies or rhinitis). 51 each 3   Multiple Vitamins-Minerals (MULTIVITAMIN WITH MINERALS) tablet Take 2 tablets by mouth at bedtime. Chewable     rosuvastatin  (CRESTOR ) 10 MG tablet TAKE 1 TABLET BY MOUTH EVERY DAY 90 tablet 3   Semaglutide , 1 MG/DOSE, (OZEMPIC , 1 MG/DOSE,) 4 MG/3ML SOPN Inject 1 mg into the skin once a week. 9 mL 3   spironolactone  (ALDACTONE ) 25 MG tablet TAKE 1 TABLET (25 MG TOTAL) BY MOUTH DAILY. 90 tablet 3   vitamin C (ASCORBIC ACID) 500 MG tablet Take 500 mg by mouth in the morning.     warfarin (COUMADIN ) 5 MG tablet TAKE 1 TABLET TO 1 AND 1/2 TABLETS BY MOUTH DAILY AS DIRECTED BY THE COUMADIN  CLINIC. 135 tablet 1   No current facility-administered medications for this encounter.   BP 116/78   Pulse 67   Ht 6' 1 (1.854 m)   Wt 114.8 kg (253 lb)   SpO2 96%   BMI 33.38 kg/m   Wt Readings from Last 3 Encounters:  03/18/24 114.8 kg (253 lb)  12/29/23 117.9 kg (260 lb)  12/10/23 117.8 kg (259 lb 9.6 oz)   PHYSICAL EXAM: General:  well appearing.  No respiratory difficulty. Walked into clinic.  Neck: JVD ~7 cm.  Cor: Regular rate & rhythm. No murmurs. Lungs: clear Extremities: no edema  Neuro: alert & oriented x 3. Affect pleasant.   ECG NSR 1AVB 66 bpm  (Personally reviewed)    ASSESSMENT & PLAN: 1. Chronic systolic  CHF: Biotronik ICD.  TEE 2/22 with EF <10%, severe RV dysfunction, LA appendage thrombus. Echoes in the past have had EF ranging 30-45%. Cardiolite in 10/20 showed no ischemia. Initially, thought to have tachycardia-mediated cardiomyopathy as he seems to become markedly more symptomatic when in atrial fibrillation.  Alternatively, he could have another cause for cardiomyopathy and atrial fibrillation just causes him to decompensate.  Echo in 7/22 in NSR showed EF 40-45% with normal RV, moderate MR (in NSR after atrial fibrillation ablation). He was found to be a compound heterozygote for hereditary hemochromatosis (evaluation done because of polycythemia), so there was concern that he could have a hemochromatosis-related CMP.  However, cardiac MRI in 11/22, showed EF 27%, moderate asymmetric septal hypertrophy, diffuse hypokinesis worse in the septal wall, RV EF 30%, noncoronary LGE with diffuse mid-wall involvement especially in the septum, T2* sequences not suggestive of cardiac hemochromatosis, ECV 50% in the septum.  Cardiomyopathy appeared most consistent with hypertrophic cardiomyopathy (nonobstructive), less likely amyloidosis or sarcoidosis.  Myeloma panel negative and PYP scan not suggestive of TTR cardiac amyloidosis.  Genetic testing showed that patient was a heterozygote for MYH7 gene mutation that has been linked to hypertrophic cardiomyopathy => Suspect that HCM is the diagnosis here.  R/LHC 5/23: mild nonobstructive CAD, normal filling pressures and low but not markedly low CO, CI 2.16.  Echo 12/23: improved EF 50%  with severe basal to mid asymmetric LVH, no LVOT gradient or MV SAM, mild RVE with normal systolic function, mild-moderate MR.  Echo 9/25: EF 30-35%, LV with GHK, GIDD, mod LVH, RV normal, LA mildly dilated.  - EF dropped from 50% 12/23> 30-35% 9/25. LHC 5/23 with mild nonobs CAD. Reports full compliance with medications. Has quit drinking over the last year. Denies any chest pain.  -  NYHA class I. Not volume overloaded on exam. ReDs mildly elevated at 36%.   - Continue spironolactone  25 mg daily.  - Continue Lasix  20 mg daily. I asked him to take 40 mg today and tomorrow then back to his 20 mg daily. I also asked him to try to watch his fluid intake a little bit more.  - Continue Coreg  18.75 mg bid. - Continue Entresto  97/103 bid.  - Continue Jardiance  10 mg daily. Denies GU symtpoms - We have discussed + MYH7 gene test. He has no children but has 1 living brother. Dr. Fairy has recommended he undergo genetic testing as well.   2. Atrial fibrillation: Paroxysmal. With LAA clots despite Xarelto  and Pradaxa  use, he was started on warfarin.  INR goal 2.5-3.  AF ablation in 5/22. He is off amiodarone  post-ablation. Regular on exam today. Denies symptoms of breakthrough Afib. - Continue warfarin. Followed in Coumadin  Clinic. Will check INR today with labs, will forwards to coumadin  clinic.   3. CKD stage 3: baseline Cr 1.3-1.5  4. OSA: Reports full compliance w/ CPAP.  - Continue CPAP nightly.   5. Type 2 diabetes: Last A1C 5.8 6/25. C/b diabetic foot ulcers, last debridement was 11/24/23. Healing.  - On Insulin . On Jardiance   6. Hereditary hemochromatosis: Compound heterozygote.  T2* sequences on cardiac MRI were not suggestive of cardiac hemochromatosis.  - Therapeutic phlebotomy only if ferritin > 200 or transferrin saturation > 75%.   7. CAD: mild nonobstructive CAD on cath 5/23, 25% mLCx and 25% pRCA lesions - On Crestor , LDL at goal 3/25 - No ASA w/ warfarin  - Continue ? blocker.  8. Dilated ascending aorta - Noted on echo 9/25 - CTA chest ordered to r/o aneurysm, needs to be scheduled.   Follow up in 4 months with Dr. Rolan.   Beckey LITTIE Coe, NP 03/18/2024

## 2024-03-18 ENCOUNTER — Ambulatory Visit (HOSPITAL_COMMUNITY): Payer: Self-pay | Admitting: Internal Medicine

## 2024-03-18 ENCOUNTER — Ambulatory Visit (HOSPITAL_COMMUNITY)
Admission: RE | Admit: 2024-03-18 | Discharge: 2024-03-18 | Disposition: A | Discharge status: Home / Self Care | Source: Ambulatory Visit | Attending: Internal Medicine | Admitting: Internal Medicine

## 2024-03-18 ENCOUNTER — Encounter (HOSPITAL_COMMUNITY): Payer: Self-pay

## 2024-03-18 ENCOUNTER — Ambulatory Visit (INDEPENDENT_AMBULATORY_CARE_PROVIDER_SITE_OTHER): Payer: Self-pay | Admitting: Cardiology

## 2024-03-18 VITALS — BP 116/78 | HR 67 | Ht 73.0 in | Wt 253.0 lb

## 2024-03-18 DIAGNOSIS — I480 Paroxysmal atrial fibrillation: Secondary | ICD-10-CM | POA: Diagnosis not present

## 2024-03-18 DIAGNOSIS — I2510 Atherosclerotic heart disease of native coronary artery without angina pectoris: Secondary | ICD-10-CM | POA: Insufficient documentation

## 2024-03-18 DIAGNOSIS — I440 Atrioventricular block, first degree: Secondary | ICD-10-CM | POA: Diagnosis not present

## 2024-03-18 DIAGNOSIS — N183 Chronic kidney disease, stage 3 unspecified: Secondary | ICD-10-CM | POA: Diagnosis not present

## 2024-03-18 DIAGNOSIS — D751 Secondary polycythemia: Secondary | ICD-10-CM | POA: Diagnosis not present

## 2024-03-18 DIAGNOSIS — I4892 Unspecified atrial flutter: Secondary | ICD-10-CM | POA: Insufficient documentation

## 2024-03-18 DIAGNOSIS — Z7901 Long term (current) use of anticoagulants: Secondary | ICD-10-CM | POA: Diagnosis not present

## 2024-03-18 DIAGNOSIS — Z7985 Long-term (current) use of injectable non-insulin antidiabetic drugs: Secondary | ICD-10-CM | POA: Insufficient documentation

## 2024-03-18 DIAGNOSIS — I5022 Chronic systolic (congestive) heart failure: Secondary | ICD-10-CM | POA: Diagnosis not present

## 2024-03-18 DIAGNOSIS — Z79899 Other long term (current) drug therapy: Secondary | ICD-10-CM | POA: Insufficient documentation

## 2024-03-18 DIAGNOSIS — I429 Cardiomyopathy, unspecified: Secondary | ICD-10-CM | POA: Insufficient documentation

## 2024-03-18 DIAGNOSIS — G4733 Obstructive sleep apnea (adult) (pediatric): Secondary | ICD-10-CM | POA: Insufficient documentation

## 2024-03-18 DIAGNOSIS — I130 Hypertensive heart and chronic kidney disease with heart failure and stage 1 through stage 4 chronic kidney disease, or unspecified chronic kidney disease: Secondary | ICD-10-CM | POA: Diagnosis not present

## 2024-03-18 DIAGNOSIS — I77819 Aortic ectasia, unspecified site: Secondary | ICD-10-CM | POA: Insufficient documentation

## 2024-03-18 DIAGNOSIS — I422 Other hypertrophic cardiomyopathy: Secondary | ICD-10-CM | POA: Diagnosis not present

## 2024-03-18 DIAGNOSIS — N1831 Chronic kidney disease, stage 3a: Secondary | ICD-10-CM | POA: Diagnosis not present

## 2024-03-18 DIAGNOSIS — Z7984 Long term (current) use of oral hypoglycemic drugs: Secondary | ICD-10-CM | POA: Insufficient documentation

## 2024-03-18 DIAGNOSIS — I454 Nonspecific intraventricular block: Secondary | ICD-10-CM | POA: Diagnosis not present

## 2024-03-18 DIAGNOSIS — I493 Ventricular premature depolarization: Secondary | ICD-10-CM | POA: Insufficient documentation

## 2024-03-18 DIAGNOSIS — E1122 Type 2 diabetes mellitus with diabetic chronic kidney disease: Secondary | ICD-10-CM | POA: Diagnosis not present

## 2024-03-18 DIAGNOSIS — Z7951 Long term (current) use of inhaled steroids: Secondary | ICD-10-CM | POA: Insufficient documentation

## 2024-03-18 DIAGNOSIS — I77810 Thoracic aortic ectasia: Secondary | ICD-10-CM | POA: Diagnosis not present

## 2024-03-18 DIAGNOSIS — E11621 Type 2 diabetes mellitus with foot ulcer: Secondary | ICD-10-CM | POA: Insufficient documentation

## 2024-03-18 DIAGNOSIS — Z604 Social exclusion and rejection: Secondary | ICD-10-CM | POA: Insufficient documentation

## 2024-03-18 DIAGNOSIS — Z95810 Presence of automatic (implantable) cardiac defibrillator: Secondary | ICD-10-CM | POA: Insufficient documentation

## 2024-03-18 DIAGNOSIS — Z794 Long term (current) use of insulin: Secondary | ICD-10-CM | POA: Insufficient documentation

## 2024-03-18 DIAGNOSIS — I513 Intracardiac thrombosis, not elsewhere classified: Secondary | ICD-10-CM

## 2024-03-18 DIAGNOSIS — Z5181 Encounter for therapeutic drug level monitoring: Secondary | ICD-10-CM | POA: Diagnosis not present

## 2024-03-18 DIAGNOSIS — Z79890 Hormone replacement therapy (postmenopausal): Secondary | ICD-10-CM | POA: Insufficient documentation

## 2024-03-18 DIAGNOSIS — L97509 Non-pressure chronic ulcer of other part of unspecified foot with unspecified severity: Secondary | ICD-10-CM | POA: Diagnosis not present

## 2024-03-18 LAB — BASIC METABOLIC PANEL WITH GFR
Anion gap: 12 (ref 5–15)
BUN: 21 mg/dL (ref 8–23)
CO2: 27 mmol/L (ref 22–32)
Calcium: 9.8 mg/dL (ref 8.9–10.3)
Chloride: 93 mmol/L — ABNORMAL LOW (ref 98–111)
Creatinine, Ser: 1.41 mg/dL — ABNORMAL HIGH (ref 0.61–1.24)
GFR, Estimated: 54 mL/min — ABNORMAL LOW (ref >60)
Glucose, Bld: 85 mg/dL (ref 70–99)
Potassium: 4.6 mmol/L (ref 3.5–5.1)
Sodium: 132 mmol/L — ABNORMAL LOW (ref 135–145)

## 2024-03-18 LAB — BRAIN NATRIURETIC PEPTIDE: B Natriuretic Peptide: 262.2 pg/mL — ABNORMAL HIGH (ref 0.0–100.0)

## 2024-03-18 LAB — PROTIME-INR
INR: 2.4 — ABNORMAL HIGH (ref 0.8–1.2)
Prothrombin Time: 27.3 s — ABNORMAL HIGH (ref 11.4–15.2)

## 2024-03-18 MED ORDER — SACUBITRIL-VALSARTAN 97-103 MG PO TABS: 1.0000 | ORAL_TABLET | Freq: Two times a day (BID) | ORAL | 3 refills | Status: AC

## 2024-03-18 MED ORDER — CARVEDILOL 12.5 MG PO TABS: 18.7500 mg | ORAL_TABLET | Freq: Two times a day (BID) | ORAL | 3 refills | Status: AC

## 2024-03-18 MED ORDER — FUROSEMIDE 20 MG PO TABS
20.0000 mg | ORAL_TABLET | Freq: Every day | ORAL | 3 refills | Status: AC
Start: 2024-03-18 — End: ?

## 2024-03-18 NOTE — Progress Notes (Signed)
 ReDS Vest / Clip - 03/18/24 1328       ReDS Vest / Clip   Station Marker D    Ruler Value 39.5    ReDS Value Range Moderate volume overload    ReDS Actual Value 36

## 2024-03-18 NOTE — Patient Instructions (Addendum)
 Medication Changes:  TODAY AND TOMORROW take Furosemide  40 mg (2 tabs) Daily, then back to 20 mg (1 tab) Daily  Lab Work:  Labs done today, your results will be available in MyChart, we will contact you for abnormal readings.  Special Instructions // Education:  Do the following things EVERYDAY: Weigh yourself in the morning before breakfast. Write it down and keep it in a log. Take your medicines as prescribed Eat low salt foods--Limit salt (sodium) to 2000 mg per day.  Stay as active as you can everyday Limit all fluids for the day to less than 2 liters   Follow-Up in: 4 months with Dr Rolan (February 2026), **PLEASE CALL OUR OFFICE IN DECEMBER TO SCHEDULE THIS APPOINTMENT   At the [facility], you and your health needs are our priority. We have a designated team specialized in the treatment of Heart Failure. This Care Team includes your primary Heart Failure Specialized Cardiologist (physician), Advanced Practice Providers (APPs- Physician Assistants and Nurse Practitioners), and Pharmacist who all work together to provide you with the care you need, when you need it.   You may see any of the following providers on your designated Care Team at your next follow up:  Dr. Toribio Fuel Dr. Ezra Rolan Dr. Ria Commander Dr. Odis Brownie Greig Mosses, NP Caffie Shed, [place] [facility] [place], [place] Beckey Coe, NP [place] Lee, NP Tinnie Redman, PharmD   Please be sure to bring in all your medications bottles to every appointment.   Need to Contact Us :  If you have any questions or concerns before your next appointment please send us  a message through [place] or call our office at [phone].    TO LEAVE A MESSAGE FOR THE NURSE SELECT OPTION 2, PLEASE LEAVE A MESSAGE INCLUDING: YOUR NAME DATE OF BIRTH CALL BACK NUMBER REASON FOR CALL**this is important as we prioritize the call backs  YOU WILL RECEIVE A CALL BACK THE SAME DAY AS LONG AS  YOU CALL BEFORE 4:00 PM

## 2024-03-18 NOTE — Progress Notes (Signed)
 INR 2.4 Please see anticoagulation encounter Spoke to patient and instructed him to take 2 tablets today only then continue taking Warfarin 1 tablet daily except for 1.5 tablets on Monday, Wednesday, Friday, and Saturday.  Stay consistent with premier protein drinks (1 time daily)  Recheck INR 1 week. Coumadin  Clinic [phone]

## 2024-03-18 NOTE — Telephone Encounter (Signed)
 Called patient per Beckey Coe, NP with following lab results and instructions:  Fluid marker slightly elevated. Diuretics increased today. Needs f/u BMET in 1 week.  Pt verbalized understanding of same. Will add BMP to his Coumadin  Clinic visit next week.

## 2024-03-23 ENCOUNTER — Ambulatory Visit: Attending: Cardiology | Admitting: Pharmacist

## 2024-03-23 DIAGNOSIS — I480 Paroxysmal atrial fibrillation: Secondary | ICD-10-CM

## 2024-03-23 DIAGNOSIS — I4891 Unspecified atrial fibrillation: Secondary | ICD-10-CM

## 2024-03-23 DIAGNOSIS — I5022 Chronic systolic (congestive) heart failure: Secondary | ICD-10-CM

## 2024-03-23 DIAGNOSIS — I513 Intracardiac thrombosis, not elsewhere classified: Secondary | ICD-10-CM | POA: Diagnosis not present

## 2024-03-23 LAB — POCT INR: INR: 3.1 — AB (ref 2.0–3.0)

## 2024-03-23 NOTE — Patient Instructions (Signed)
 Description   INR 3.1: Eat some greens this week and continue taking Warfarin 1 tablet daily except for 1.5 tablets on Monday, Wednesday, Friday, and Saturday.  Stay consistent with premier protein drinks (1 time daily)  Recheck INR 3 week. Coumadin  Clinic [phone]

## 2024-03-23 NOTE — Progress Notes (Signed)
 Description   INR 3.1: Eat some greens this week and continue taking Warfarin 1 tablet daily except for 1.5 tablets on Monday, Wednesday, Friday, and Saturday.  Stay consistent with premier protein drinks (1 time daily)  Recheck INR 3 week. Coumadin  Clinic [phone]

## 2024-04-05 ENCOUNTER — Ambulatory Visit (HOSPITAL_COMMUNITY)
Admission: RE | Admit: 2024-04-05 | Discharge: 2024-04-05 | Disposition: A | Discharge status: Home / Self Care | Source: Ambulatory Visit | Attending: Cardiology | Admitting: Cardiology

## 2024-04-05 ENCOUNTER — Ambulatory Visit (HOSPITAL_COMMUNITY): Payer: Self-pay | Admitting: Internal Medicine

## 2024-04-05 DIAGNOSIS — I5022 Chronic systolic (congestive) heart failure: Secondary | ICD-10-CM | POA: Insufficient documentation

## 2024-04-05 LAB — BASIC METABOLIC PANEL WITH GFR
Anion gap: 10 (ref 5–15)
BUN: 22 mg/dL (ref 8–23)
CO2: 27 mmol/L (ref 22–32)
Calcium: 9.7 mg/dL (ref 8.9–10.3)
Chloride: 96 mmol/L — ABNORMAL LOW (ref 98–111)
Creatinine, Ser: 1.34 mg/dL — ABNORMAL HIGH (ref 0.61–1.24)
GFR, Estimated: 57 mL/min — ABNORMAL LOW (ref >60)
Glucose, Bld: 112 mg/dL — ABNORMAL HIGH (ref 70–99)
Potassium: 4.4 mmol/L (ref 3.5–5.1)
Sodium: 133 mmol/L — ABNORMAL LOW (ref 135–145)

## 2024-04-06 DIAGNOSIS — I110 Hypertensive heart disease with heart failure: Secondary | ICD-10-CM | POA: Diagnosis not present

## 2024-04-06 DIAGNOSIS — D6869 Other thrombophilia: Secondary | ICD-10-CM | POA: Diagnosis not present

## 2024-04-06 DIAGNOSIS — I4892 Unspecified atrial flutter: Secondary | ICD-10-CM | POA: Diagnosis not present

## 2024-04-06 DIAGNOSIS — Z809 Family history of malignant neoplasm, unspecified: Secondary | ICD-10-CM | POA: Diagnosis not present

## 2024-04-06 DIAGNOSIS — G4733 Obstructive sleep apnea (adult) (pediatric): Secondary | ICD-10-CM | POA: Diagnosis not present

## 2024-04-06 DIAGNOSIS — J45909 Unspecified asthma, uncomplicated: Secondary | ICD-10-CM | POA: Diagnosis not present

## 2024-04-06 DIAGNOSIS — I4891 Unspecified atrial fibrillation: Secondary | ICD-10-CM | POA: Diagnosis not present

## 2024-04-06 DIAGNOSIS — Z7984 Long term (current) use of oral hypoglycemic drugs: Secondary | ICD-10-CM | POA: Diagnosis not present

## 2024-04-06 DIAGNOSIS — Z7985 Long-term (current) use of injectable non-insulin antidiabetic drugs: Secondary | ICD-10-CM | POA: Diagnosis not present

## 2024-04-06 DIAGNOSIS — E1151 Type 2 diabetes mellitus with diabetic peripheral angiopathy without gangrene: Secondary | ICD-10-CM | POA: Diagnosis not present

## 2024-04-06 DIAGNOSIS — Z7901 Long term (current) use of anticoagulants: Secondary | ICD-10-CM | POA: Diagnosis not present

## 2024-04-06 DIAGNOSIS — Z794 Long term (current) use of insulin: Secondary | ICD-10-CM | POA: Diagnosis not present

## 2024-04-06 DIAGNOSIS — E669 Obesity, unspecified: Secondary | ICD-10-CM | POA: Diagnosis not present

## 2024-04-06 DIAGNOSIS — Z6832 Body mass index (BMI) 32.0-32.9, adult: Secondary | ICD-10-CM | POA: Diagnosis not present

## 2024-04-06 DIAGNOSIS — I429 Cardiomyopathy, unspecified: Secondary | ICD-10-CM | POA: Diagnosis not present

## 2024-04-06 DIAGNOSIS — I872 Venous insufficiency (chronic) (peripheral): Secondary | ICD-10-CM | POA: Diagnosis not present

## 2024-04-06 DIAGNOSIS — I509 Heart failure, unspecified: Secondary | ICD-10-CM | POA: Diagnosis not present

## 2024-04-06 DIAGNOSIS — B351 Tinea unguium: Secondary | ICD-10-CM | POA: Diagnosis not present

## 2024-04-06 DIAGNOSIS — E785 Hyperlipidemia, unspecified: Secondary | ICD-10-CM | POA: Diagnosis not present

## 2024-04-06 DIAGNOSIS — I4430 Unspecified atrioventricular block: Secondary | ICD-10-CM | POA: Diagnosis not present

## 2024-04-14 ENCOUNTER — Other Ambulatory Visit: Payer: Self-pay | Admitting: Internal Medicine

## 2024-04-18 ENCOUNTER — Encounter: Payer: Self-pay | Admitting: Radiology

## 2024-04-19 ENCOUNTER — Encounter

## 2024-04-20 ENCOUNTER — Ambulatory Visit: Attending: Cardiology | Admitting: *Deleted

## 2024-04-20 DIAGNOSIS — I513 Intracardiac thrombosis, not elsewhere classified: Secondary | ICD-10-CM

## 2024-04-20 DIAGNOSIS — Z5181 Encounter for therapeutic drug level monitoring: Secondary | ICD-10-CM

## 2024-04-20 DIAGNOSIS — I4891 Unspecified atrial fibrillation: Secondary | ICD-10-CM

## 2024-04-20 LAB — POCT INR: INR: 3.2 — AB (ref 2.0–3.0)

## 2024-04-20 NOTE — Patient Instructions (Signed)
 Description   INR 3.2; Today take 1 tablet of warfarin then continue taking Warfarin 1 tablet daily except for 1.5 tablets on Monday, Wednesday, Friday, and Saturday.  Stay consistent with premier protein drinks (1 time daily)  Recheck INR 3 weeks. Coumadin  Clinic [phone]

## 2024-04-20 NOTE — Progress Notes (Signed)
 Description   INR 3.2; Today take 1 tablet of warfarin then continue taking Warfarin 1 tablet daily except for 1.5 tablets on Monday, Wednesday, Friday, and Saturday.  Stay consistent with premier protein drinks (1 time daily)  Recheck INR 3 weeks. Coumadin  Clinic [phone]

## 2024-04-27 DIAGNOSIS — E1169 Type 2 diabetes mellitus with other specified complication: Secondary | ICD-10-CM | POA: Diagnosis not present

## 2024-04-29 ENCOUNTER — Other Ambulatory Visit: Payer: Self-pay | Admitting: Internal Medicine

## 2024-04-29 DIAGNOSIS — E1169 Type 2 diabetes mellitus with other specified complication: Secondary | ICD-10-CM

## 2024-04-29 NOTE — Telephone Encounter (Signed)
 Returned patient call.  Prescription for Novolog  noted in chart and waiting on cosign.  Called patient to update him on status.  He was not available.  Message left.  Leita Constable, RD, LDN, CDCES, DipACLM

## 2024-05-10 ENCOUNTER — Ambulatory Visit: Payer: Medicare HMO

## 2024-05-10 DIAGNOSIS — I4891 Unspecified atrial fibrillation: Secondary | ICD-10-CM | POA: Diagnosis not present

## 2024-05-11 ENCOUNTER — Ambulatory Visit: Attending: Internal Medicine | Admitting: *Deleted

## 2024-05-11 DIAGNOSIS — I4891 Unspecified atrial fibrillation: Secondary | ICD-10-CM

## 2024-05-11 DIAGNOSIS — I513 Intracardiac thrombosis, not elsewhere classified: Secondary | ICD-10-CM | POA: Diagnosis not present

## 2024-05-11 LAB — CUP PACEART REMOTE DEVICE CHECK
Date Time Interrogation Session: 20251125092439
Implantable Lead Connection Status: 753985
Implantable Lead Implant Date: 20231127
Implantable Lead Location: 753860
Implantable Lead Model: 436909
Implantable Lead Serial Number: 81547891
Implantable Pulse Generator Implant Date: 20231127
Pulse Gen Model: 429525
Pulse Gen Serial Number: 84932826

## 2024-05-11 LAB — POCT INR: INR: 2.8 (ref 2.0–3.0)

## 2024-05-11 NOTE — Patient Instructions (Addendum)
 Description   INR-2.8; Continue taking Warfarin 1 tablet daily except for 1.5 tablets on Monday, Wednesday, Friday, and Saturday.  Stay consistent with premier protein drinks (1 time daily)  Recheck INR 5 weeks. Coumadin  Clinic [phone]

## 2024-05-11 NOTE — Progress Notes (Signed)
 Description   INR-2.8; Continue taking Warfarin 1 tablet daily except for 1.5 tablets on Monday, Wednesday, Friday, and Saturday.  Stay consistent with premier protein drinks (1 time daily)  Recheck INR 5 weeks. Coumadin  Clinic [phone]

## 2024-05-13 NOTE — Progress Notes (Signed)
 Remote ICD Transmission

## 2024-05-18 ENCOUNTER — Ambulatory Visit: Payer: Self-pay | Admitting: Internal Medicine

## 2024-06-07 ENCOUNTER — Ambulatory Visit: Admitting: Internal Medicine

## 2024-06-07 ENCOUNTER — Encounter: Payer: Self-pay | Admitting: Internal Medicine

## 2024-06-07 VITALS — BP 112/60 | HR 66 | Ht 73.0 in | Wt 254.4 lb

## 2024-06-07 DIAGNOSIS — E785 Hyperlipidemia, unspecified: Secondary | ICD-10-CM

## 2024-06-07 DIAGNOSIS — E1169 Type 2 diabetes mellitus with other specified complication: Secondary | ICD-10-CM

## 2024-06-07 DIAGNOSIS — Z7985 Long-term (current) use of injectable non-insulin antidiabetic drugs: Secondary | ICD-10-CM

## 2024-06-07 DIAGNOSIS — E039 Hypothyroidism, unspecified: Secondary | ICD-10-CM

## 2024-06-07 DIAGNOSIS — Z7984 Long term (current) use of oral hypoglycemic drugs: Secondary | ICD-10-CM

## 2024-06-07 DIAGNOSIS — Z6833 Body mass index (BMI) 33.0-33.9, adult: Secondary | ICD-10-CM | POA: Diagnosis not present

## 2024-06-07 DIAGNOSIS — Z794 Long term (current) use of insulin: Secondary | ICD-10-CM

## 2024-06-07 DIAGNOSIS — E66811 Obesity, class 1: Secondary | ICD-10-CM | POA: Diagnosis not present

## 2024-06-07 LAB — POCT GLYCOSYLATED HEMOGLOBIN (HGB A1C): Hemoglobin A1C: 5.6 % (ref 4.0–5.6)

## 2024-06-07 MED ORDER — TRESIBA FLEXTOUCH 200 UNIT/ML ~~LOC~~ SOPN: 28.0000 [IU] | PEN_INJECTOR | Freq: Every day | SUBCUTANEOUS | 2 refills | Status: AC

## 2024-06-07 NOTE — Patient Instructions (Addendum)
 Please continue: - Jardiance  10 mg before b'fast - Ozempic  1 mg weekly in a.m.  - Tresiba  U200 28 units daily - Novolog  6-12 units only before a larger meals  Please continue Levothyroxine  50 mcg daily.  Take the thyroid  hormone every day, with water, at least 30 minutes before breakfast, separated by at least 4 hours from: - acid reflux medications - calcium  - iron - multivitamins  Please return in 6 months.

## 2024-06-07 NOTE — Progress Notes (Addendum)
 Patient ID: MATTSON DAYAL, male   DOB: Dec 21, 1955, 68 y.o.   MRN: [id number]   HPI: RYOSUKE ERICKSEN is a 68 y.o.-year-old male, initially referred by his PCP, Dr. Rollene, returning for follow-up for DM2, dx in 2017, insulin -dependent, uncontrolled, with complications (CHF, Afib/AFlutter, CKD, h/o calf diabetic ulcer).  Last visit 6 mo ago.  Interim history: He denies blurred vision, nausea, shortness of breath, chest pain.  He He has a history of shin ulcers, now completely healed. Seeing wound center and the podiatrist.    Reviewed HbA1c levels Lab Results  Component Value Date   HGBA1C 5.8 (A) 12/03/2023   HGBA1C 5.8 (H) 09/18/2023   HGBA1C 6.0 (A) 06/04/2023   HGBA1C 5.7 (A) 12/02/2022   HGBA1C 5.7 (A) 05/20/2022   HGBA1C 5.9 (A) 11/19/2021   HGBA1C 6.0 (A) 05/16/2021   HGBA1C 6.0 (A) 11/08/2020   HGBA1C 5.3 08/04/2020   HGBA1C 5.7 (A) 06/29/2020   In the past, he was on: - Levemir  40 units 2x a day - Novolog  5 units + SSI 3x a day, before meals: BG 70-120: 0 Units, BG 121-150:3 units, BG 151-200:4 Units, BG 201-250:7 units BG 251-300: 11 Units, 301-350 : 15 units. If BG >350: Call your primary care doctor He was on Glipizide  since 2017.  Now on:  - Jardiance  10 mg before b'fast - Ozempic  0.5 >> 1 mg weekly   - Tresiba  U200 66 >> 50 >> 40 >> 32 >> 28 units daily - Novolog  8-12 units based on the size of the meal >> ... 8-12 units before a larger meal only >>  before every meal >> 6-8 >> 6-12 units before larger meals  He checks his sugars more than 4 times a day with his freestyle libre CGM - from [facility]:  Previously:  Previously:  Lowest sugar was  58 (delayed lunch) >> .SABRA. 100 >> 84 >> 80; he has hypoglycemia awareness in the 70s. Highest sugar was 330 >> .SABRA.217 >> 201 >> 190. He wasadmitted in 08/2019 with hyperosmolar hyperglycemic nonketotic state, and was found to have a glucose of 1186, along with hyponatremia and AKI.  An HbA1c level was very high.  Glucometer:  [facility]   Pt's meals are: - Breakfast: fruit >>  >> -  almond milk with bran flakes, berries, banana, mandarins + protein shake - Lunch: salad - Dinner: salad, chili, hamburger patty, grilled chicken -moved dinner earlier - Snacks:- He saw nutrition in 2016.  -+ CKD, last BUN/creatinine:  Lab Results  Component Value Date   BUN 22 04/05/2024   BUN 21 03/18/2024   CREATININE 1.34 (H) 04/05/2024   CREATININE 1.41 (H) 03/18/2024   Lab Results  Component Value Date   MICRALBCREAT 32 (H) 06/04/2023  Prev. on Cozaar  50, now Entresto .  -+ HL; last set of lipids: Lab Results  Component Value Date   CHOL 129 08/19/2023   HDL 37.60 (L) 08/19/2023   LDLCALC 46 08/19/2023   LDLDIRECT 139.0 11/19/2021   TRIG 224.0 (H) 08/19/2023   CHOLHDL 3 08/19/2023  On Crestor  10.  - last eye exam was 01/2023: [facility] DR reportedly[facility] [place]. Coming up in January.  -No numbness and tingling in his feet.  He has a history of right calf ulcer- he required skin graft - 2020 - had a terrible time then.   He had another ulcer in the other leg in 2020, before he was diagnosed with diabetes and he had to have extensive treatment for  this.  He now has several ulcers of which 2 active.  He is going to the wound center every week.  He was seen in the vascular department in 2020. Last foot exam was done by Dr. Gershon 01/2024.  Pt has no FH of DM.  Uncontrolled hypothyroidism:    Reviewed his TFTs: Lab Results  Component Value Date   TSH 2.09 08/19/2023   TSH 0.79 12/02/2022   TSH 1.79 11/19/2021   TSH 3.280 04/23/2021   TSH 3.401 01/09/2021   TSH 3.52 06/29/2020   TSH 4.61 (H) 05/16/2020   TSH 33.362 (H) 09/03/2019   TSH 13.910 (H) 02/02/2019   TSH 25.834 (H) 01/31/2019   He takes levothyroxine  50 mcg daily: - in am - fasting - drinks black coffee - at least 30 min from b'fast - no calcium  - no iron - + multivitamins in the evening - no PPIs - not on Biotin  He has a history  of A. Fib and was found to have a thrombus in the left atrial appendage.  He had cardioversion in 08/2020.  He also has chronic systolic CHF.   He has a history of HTN, OSA.  Also, hereditary hemochromatosis, polycythemia. In the past he was drinking 4-5 beers every day.  Previously was drinking hard liquor.  He stopped drinking alcohol  in 2021.  ROS: + See HPI  I reviewed pt's medications, allergies, PMH, social hx, family hx, and changes were documented in the history of present illness. Otherwise, unchanged from my initial visit note.  Past Medical History:  Diagnosis Date   Allergy    Asthma    as a teenager - does not use an inhaler although pt said he has an albuteral inhaler   Atrial fibrillation (HCC)    persistant 02/2009   Atrial flutter (HCC)    s/p CTI ablation 08/06/10   Cataract    Chronic rhinitis    Colon polyp    Congestive heart failure (HCC)    Diverticulosis    colonoscopy 04/03/2009   Hyperlipidemia    Hypertension    Morbid obesity (HCC)    target weight = 219  for BMI < 30   Nonischemic cardiomyopathy (HCC)    tachycardia mediated   Seasonal allergies    Sleep apnea    original 2001 - wears C-PaP   Type 2 diabetes mellitus (HCC)    Past Surgical History:  Procedure Laterality Date   ATRIAL FIBRILLATION ABLATION N/A 12/14/2018   Procedure: ATRIAL FIBRILLATION ABLATION;  Surgeon: Kelsie Agent, MD;  Location: [facility] INVASIVE CV LAB;  Service: Cardiovascular;  Laterality: N/A;   ATRIAL FIBRILLATION ABLATION N/A 10/18/2020   Procedure: ATRIAL FIBRILLATION ABLATION;  Surgeon: Kelsie Agent, MD;  Location: [facility] INVASIVE CV LAB;  Service: Cardiovascular;  Laterality: N/A;   atrial flutter ablation  08/06/10   CARDIOVERSION N/A 07/05/2018   Procedure: CARDIOVERSION;  Surgeon: Lonni Slain, MD;  Location: [facility] ENDOSCOPY;  Service: Cardiovascular;  Laterality: N/A;   CARDIOVERSION N/A 08/08/2020   Procedure: CARDIOVERSION;  Surgeon: Rolan Ezra RAMAN, MD;  Location:  [facility] ENDOSCOPY;  Service: Cardiovascular;  Laterality: N/A;   CARDIOVERSION N/A 08/29/2020   Procedure: CARDIOVERSION;  Surgeon: Cherrie Toribio SAUNDERS, MD;  Location: [facility] ENDOSCOPY;  Service: Cardiovascular;  Laterality: N/A;   COLONOSCOPY     ICD IMPLANT N/A 05/12/2022   Procedure: ICD IMPLANT;  Surgeon: Waddell Danelle ORN, MD;  Location: [facility] INVASIVE CV LAB;  Service: Cardiovascular;  Laterality: N/A;   POLYPECTOMY  RIGHT HEART CATH N/A 08/06/2020   Procedure: RIGHT HEART CATH;  Surgeon: Rolan Ezra RAMAN, MD;  Location: [facility] INVASIVE CV LAB;  Service: Cardiovascular;  Laterality: N/A;   RIGHT/LEFT HEART CATH AND CORONARY ANGIOGRAPHY N/A 11/01/2021   Procedure: RIGHT/LEFT HEART CATH AND CORONARY ANGIOGRAPHY;  Surgeon: Rolan Ezra RAMAN, MD;  Location: [facility] INVASIVE CV LAB;  Service: Cardiovascular;  Laterality: N/A;   TEE WITHOUT CARDIOVERSION N/A 12/14/2018   Procedure: TRANSESOPHAGEAL ECHOCARDIOGRAM (TEE);  Surgeon: Kelsie Agent, MD;  Location: [facility] INVASIVE CV LAB;  Service: Cardiovascular;  Laterality: N/A;   TEE WITHOUT CARDIOVERSION N/A 08/03/2020   Procedure: TRANSESOPHAGEAL ECHOCARDIOGRAM (TEE);  Surgeon: Francyne Headland, MD;  Location: [facility] ENDOSCOPY;  Service: Cardiovascular;  Laterality: N/A;   TEE WITHOUT CARDIOVERSION N/A 08/08/2020   Procedure: TRANSESOPHAGEAL ECHOCARDIOGRAM (TEE);  Surgeon: Rolan Ezra RAMAN, MD;  Location: [facility] ENDOSCOPY;  Service: Cardiovascular;  Laterality: N/A;   TEE WITHOUT CARDIOVERSION N/A 10/18/2020   Procedure: TRANSESOPHAGEAL ECHOCARDIOGRAM (TEE);  Surgeon: Jeffrie Oneil BROCKS, MD;  Location: [facility] ENDOSCOPY;  Service: Cardiovascular;  Laterality: N/A;   Social History   Socioeconomic History   Marital status: Single    Spouse name: Not on file   Number of children: 0   Years of education: Not on file   Highest education level: Some college, no degree  Occupational History   Occupation: RETIRED/Sales  Tobacco Use   Smoking status: Never   Smokeless tobacco: Never  Vaping  Use   Vaping status: Never Used  Substance and Sexual Activity   Alcohol  use: Not Currently    Alcohol /week: 2.0 standard drinks of alcohol     Comment: occasionally   Drug use: No   Sexual activity: Yes  Other Topics Concern   Not on file  Social History Narrative   Lives in [place] and works for [employer]   Social Drivers of Health   Tobacco Use: Low Risk (03/18/2024)   Patient History    Smoking Tobacco Use: Never    Smokeless Tobacco Use: Never    Passive Exposure: Not on file  Financial Resource Strain: Low Risk (08/09/2023)   Overall Financial Resource Strain (CARDIA)    Difficulty of Paying Living Expenses: Not hard at all  Food Insecurity: No Food Insecurity (08/09/2023)   Hunger Vital Sign    Worried About Running Out of Food in the Last Year: Never true    Ran Out of Food in the Last Year: Never true  Transportation Needs: No Transportation Needs (08/09/2023)   PRAPARE - Transportation    Lack of Transportation (Medical): No    Lack of Transportation (Non-Medical): No  Physical Activity: Inactive (08/13/2023)   Exercise Vital Sign    Days of Exercise per Week: 0 days    Minutes of Exercise per Session: 0 min  Stress: No Stress Concern Present (08/09/2023)   [employer] of Occupational Health - Occupational Stress Questionnaire    Feeling of Stress : Not at all  Social Connections: Socially Isolated (08/09/2023)   Social Connection and Isolation Panel    Frequency of Communication with Friends and Family: Once a week    Frequency of Social Gatherings with Friends and Family: Three times a week    Attends Religious Services: Never    Active Member of Clubs or Organizations: No    Attends Banker Meetings: Not on file    Marital Status: Separated  Intimate Partner Violence: Patient Unable To Answer (08/13/2023)   Humiliation, Afraid, Rape, and Kick questionnaire  Fear of Current or Ex-Partner: Patient unable to answer    Emotionally Abused:  Patient unable to answer    Physically Abused: Patient unable to answer    Sexually Abused: Patient unable to answer  Depression (PHQ2-9): Low Risk (09/22/2023)   Depression (PHQ2-9)    PHQ-2 Score: 0  Alcohol  Screen: Low Risk (12/09/2022)   Alcohol  Screen    Last Alcohol  Screening Score (AUDIT): 5  Housing: Low Risk (08/09/2023)   Housing Stability Vital Sign    Unable to Pay for Housing in the Last Year: No    Number of Times Moved in the Last Year: 0    Homeless in the Last Year: No  Utilities: Not At Risk (08/13/2023)   AHC Utilities    Threatened with loss of utilities: No  Health Literacy: Adequate Health Literacy (08/13/2023)   B1300 Health Literacy    Frequency of need for help with medical instructions: Never   Current Outpatient Medications on File Prior to Visit  Medication Sig Dispense Refill   BD PEN NEEDLE NANO 2ND GEN 32G X 4 MM MISC USE 4 TIMES A DAY AS DIRECTED 400 each 1   budesonide -formoterol  (SYMBICORT ) 80-4.5 MCG/ACT inhaler Inhale 2 puffs into the lungs in the morning and at bedtime. 30.6 each 11   carvedilol  (COREG ) 12.5 MG tablet Take 1.5 tablets (18.75 mg total) by mouth 2 (two) times daily with a meal. 270 tablet 3   cholecalciferol (VITAMIN D3) 25 MCG (1000 UNIT) tablet Take 1,000 Units by mouth at bedtime.     ciclopirox  (PENLAC ) 8 % solution APPLY TOPICALLY AT BEDTIME. APPLY OVER NAIL AND SURROUNDING SKIN. APPLY DAILY OVER PREVIOUS COAT. AFTER SEVEN (7) DAYS, MAY REMOVE WITH ALCOHOL  AND CONTINUE CYCLE. 6.6 mL 2   Continuous Blood Gluc Receiver (FREESTYLE LIBRE 14 DAY READER) DEVI USE AS DIRECTED 1 each 11   doxylamine , Sleep, (UNISOM ) 25 MG tablet Take 50 mg by mouth at bedtime.     empagliflozin  (JARDIANCE ) 10 MG TABS tablet Take 1 tablet (10 mg total) by mouth daily. 90 tablet 3   enoxaparin  (LOVENOX ) 120 MG/0.8ML injection Inject 0.8 mLs (120 mg total) into the skin every 12 (twelve) hours. 16 mL 1   furosemide  (LASIX ) 20 MG tablet Take 1 tablet (20 mg  total) by mouth daily. 90 tablet 3   insulin  aspart (NOVOLOG  FLEXPEN) 100 UNIT/ML FlexPen INJECT 6-8 UNITS INTO THE SKIN 3 (THREE) TIMES DAILY  before larger meals 15 mL 3   insulin  degludec (TRESIBA  FLEXTOUCH) 200 UNIT/ML FlexTouch Pen INJECT 28 UNITS INTO THE SKIN DAILY. 18 mL 2   levothyroxine  (SYNTHROID ) 50 MCG tablet Take 1 tablet (50 mcg total) by mouth daily before breakfast. 90 tablet 3   MELATONIN PO Take 3 capsules by mouth at bedtime as needed (sleep).     mometasone  (NASONEX ) 50 MCG/ACT nasal spray Place 2 sprays into the nose daily as needed (allergies or rhinitis). 51 each 3   Multiple Vitamins-Minerals (MULTIVITAMIN WITH MINERALS) tablet Take 2 tablets by mouth at bedtime. Chewable     rosuvastatin  (CRESTOR ) 10 MG tablet TAKE 1 TABLET BY MOUTH EVERY DAY 90 tablet 3   sacubitril -valsartan  (ENTRESTO ) 97-103 MG Take 1 tablet by mouth 2 (two) times daily. 180 tablet 3   Semaglutide , 1 MG/DOSE, (OZEMPIC , 1 MG/DOSE,) 4 MG/3ML SOPN Inject 1 mg into the skin once a week. 9 mL 3   spironolactone  (ALDACTONE ) 25 MG tablet TAKE 1 TABLET (25 MG TOTAL) BY MOUTH DAILY. 90 tablet 3  vitamin C (ASCORBIC ACID) 500 MG tablet Take 500 mg by mouth in the morning.     warfarin (COUMADIN ) 5 MG tablet TAKE 1 TABLET TO 1 AND 1/2 TABLETS BY MOUTH DAILY AS DIRECTED BY THE COUMADIN  CLINIC. 135 tablet 1   No current facility-administered medications on file prior to visit.   No Known Allergies Family History  Problem Relation Age of Onset   Hypertension Mother    Kidney failure Mother        Due to sepsis   COPD Father    Colon cancer Brother        dx in 37's   Esophageal cancer Neg Hx    Rectal cancer Neg Hx    Stomach cancer Neg Hx    PE: BP 112/60   Pulse 66   Ht 6' 1 (1.854 m)   Wt 254 lb 6.4 oz (115.4 kg)   SpO2 96%   BMI 33.56 kg/m   Wt Readings from Last 10 Encounters:  06/07/24 254 lb 6.4 oz (115.4 kg)  03/18/24 253 lb (114.8 kg)  12/29/23 260 lb (117.9 kg)  12/10/23 259 lb 9.6  oz (117.8 kg)  12/03/23 259 lb 3.2 oz (117.6 kg)  10/01/23 260 lb 3.2 oz (118 kg)  09/22/23 254 lb (115.2 kg)  08/19/23 263 lb (119.3 kg)  08/13/23 254 lb (115.2 kg)  08/10/23 263 lb 14.4 oz (119.7 kg)   Constitutional: overweight, in NAD Eyes: EOMI, no exophthalmos ENT: no thyromegaly, no cervical lymphadenopathy Cardiovascular: RRR, No MRG Respiratory: CTA B Musculoskeletal: no deformities Skin: no rashes; left foot in boot, lower legs wrapped Neurological: + tremor with outstretched hands  ASSESSMENT: 1. DM2, insulin -dependent, uncontrolled, with complications - CHF - A. fib/a flutter - CKD - h/o Diabetic ulcer of calf - h/o HHNK - admitted with a glucose of >1000  -started on basal bolus regimen in the hospital  2.  Hypothyroidism - Uncontrolled  3. Obesity class 1  4. HL  PLAN:  1. Patient with previously uncontrolled type 2 diabetes, insulin -dependent, on SGLT2 inhibitor, weekly GLP-1 receptor agonist, and basal-bolus insulin , with improved control lately.  At last visit, HbA1c was 5.8%, excellent.  At that time, reviewing the CGM trends, sugars were almost entirely fluctuating in the target range, slightly higher values after meals, upon questioning, however he increased the NovoLog  since the previous visit and was taking it before every meal.  We discussed about trying to increase the dose of Ozempic  so we could decrease the dose of NovoLog  I advised him to only take a lower dose before a larger meal. CGM interpretation: -At today's visit, we reviewed his CGM downloads: It appears that 99% of values are in target range (goal >70%), while 1% are higher than 180 (goal <25%), and 0% are lower than 70 (goal <4%).  The calculated average blood sugar is 121.  The projected HbA1c for the next 3 months (GMI) is 6.2%. -Reviewing the CGM trends, sugars are fluctuating within the target range, but higher after b'fast.  Upon questioning, he is eating a large breakfast consisting of  cereals, several fruit, and also a protein shake.  We discussed that this is too much and I advised him to eliminate the protein shake.  For now, we will continue the rest of the regimen.  He is still using NovoLog  but due to the good control, I did not recommend to change this. -We did discuss about possibly changing to the freestyle libre 3+ but he would prefer  to continue with his freestyle herlene now.  I did advise him to change the phone app to the newest version. - I suggested to:  Patient Instructions  Please continue: - Jardiance  10 mg before b'fast - Ozempic  1 mg weekly in a.m.  - Tresiba  U200 28 units daily - Novolog  6-12 units only before a larger meals  Please continue Levothyroxine  50 mcg daily.  Take the thyroid  hormone every day, with water, at least 30 minutes before breakfast, separated by at least 4 hours from: - acid reflux medications - calcium  - iron - multivitamins  Please return in 6 months.   - we checked his HbA1c: 5.6% (lower) - advised to check sugars at different times of the day - 4x a day, rotating check times - advised for yearly eye exams >> he is UTD - he has microalbuminuria, which at last visit appears improved, although still slightly elevated.  He is on Cozaar  and Jardiance . - He has multiple venous ulcers - seen at the wound center.  I also recommended to go back to see vascular again.  He saw them in 2020.  He is now also seeing podiatry. - return to clinic in 6 months  2.  Hypothyroidism - latest thyroid  labs reviewed with pt. >> normal: Lab Results  Component Value Date   TSH 2.09 08/19/2023  - he continues on LT4 50 mcg daily - pt feels good on this dose. - we discussed about taking the thyroid  hormone every day, with water, >30 minutes before breakfast, separated by >4 hours from acid reflux medications, calcium , iron, multivitamins. Pt. is taking it correctly. - will check thyroid  tests today: TSH and fT4 - If labs are abnormal, he will  need to return for repeat TFTs in 1.5 months  3. Obesity class 1 - will continue the SGLT2 inhibitor and GLP-1 receptor agonist, which should also help with weight loss - Weight was approximately stable at the last 2 visits  4. HL -Latest lipid panel showed an LDL at goal, significantly from baseline, high triglycerides and low HDL: Lab Results  Component Value Date   CHOL 129 08/19/2023   HDL 37.60 (L) 08/19/2023   LDLCALC 46 08/19/2023   LDLDIRECT 139.0 11/19/2021   TRIG 224.0 (H) 08/19/2023   CHOLHDL 3 08/19/2023  -He continues Crestor  10 mg daily without side effect  Needs LT4 refills.  Component     Latest Ref Rng 06/07/2024  TSH     0.40 - 4.50 mIU/L 1.51   Hemoglobin A1C     4.0 - 5.6 % 5.6   T4,Free(Direct)     0.8 - 1.8 ng/dL 1.3   Creatinine, Urine     20 - 320 mg/dL 52   Microalb, Ur     mg/dL 5.9   MICROALB/CREAT RATIO     <30 mg/g creat 113 (H)   Thyroid  tests are normal.  ACR is still elevated with a  higher ACR.  He switched from Cozaar  to Entresto  since last visit.  He continues Jardiance  at 10 mg before breakfast.  Will recheck his ACR at next visit and if continues to increase, plan to refer him to nephrology.  Requested Prescriptions   Signed Prescriptions Disp Refills   insulin  degludec (TRESIBA  FLEXTOUCH) 200 UNIT/ML FlexTouch Pen 18 mL 2    Sig: Inject 28 Units into the skin daily.   levothyroxine  (SYNTHROID ) 50 MCG tablet 90 tablet 3    Sig: Take 1 tablet (50 mcg total) by mouth daily before  breakfast.   Lela Fendt, MD PhD [facility] Endocrinology

## 2024-06-08 LAB — MICROALBUMIN / CREATININE URINE RATIO
Creatinine, Urine: 52 mg/dL (ref 20–320)
Microalb Creat Ratio: 113 mg/g{creat} — ABNORMAL HIGH
Microalb, Ur: 5.9 mg/dL

## 2024-06-08 LAB — TSH: TSH: 1.51 m[IU]/L (ref 0.40–4.50)

## 2024-06-08 LAB — T4, FREE: Free T4: 1.3 ng/dL (ref 0.8–1.8)

## 2024-06-10 ENCOUNTER — Ambulatory Visit: Payer: Self-pay | Admitting: Internal Medicine

## 2024-06-10 MED ORDER — LEVOTHYROXINE SODIUM 50 MCG PO TABS: 50.0000 ug | ORAL_TABLET | Freq: Every day | ORAL | 3 refills | Status: AC

## 2024-06-10 NOTE — Addendum Note (Signed)
 Addended by: TRIXIE FILE on: 06/10/2024 12:27 PM   Modules accepted: Orders

## 2024-06-13 ENCOUNTER — Other Ambulatory Visit (HOSPITAL_COMMUNITY): Payer: Self-pay

## 2024-06-13 ENCOUNTER — Telehealth (HOSPITAL_COMMUNITY): Payer: Self-pay

## 2024-06-13 NOTE — Telephone Encounter (Signed)
 Advanced Heart Failure Patient Advocate Encounter  The patient was approved for a [employer] grant that will help cover the cost of Carvedilol , Entresto , Jardiance , Spironolactone .  Total amount awarded, $7,500.  Effective: 05/24/2024 - 05/23/2025.  BIN [id number] PCN [id number] Group [id number] ID [id number]  Pharmacy provided with approval and processing information. Patient informed via phone.  Rachel DEL, CPhT Rx Patient Advocate Phone: [phone]

## 2024-06-15 ENCOUNTER — Ambulatory Visit: Admitting: *Deleted

## 2024-06-15 DIAGNOSIS — I4891 Unspecified atrial fibrillation: Secondary | ICD-10-CM

## 2024-06-15 DIAGNOSIS — I513 Intracardiac thrombosis, not elsewhere classified: Secondary | ICD-10-CM

## 2024-06-15 DIAGNOSIS — Z5181 Encounter for therapeutic drug level monitoring: Secondary | ICD-10-CM | POA: Diagnosis not present

## 2024-06-15 LAB — POCT INR: INR: 3.5 — AB (ref 2.0–3.0)

## 2024-06-15 NOTE — Progress Notes (Signed)
 Description   INR-3.5; Do not take any warfarin then continue taking Warfarin 1 tablet daily except for 1.5 tablets on Monday, Wednesday, Friday, and Saturday.  STOPPED premier protein drinks (1 time daily) ON 06/12/24, ADD MORE LEAFY VEGGIES IN DIET.  Recheck INR 4 weeks. Coumadin  Clinic [phone]

## 2024-06-15 NOTE — Patient Instructions (Signed)
 Description   INR-3.5; Do not take any warfarin then continue taking Warfarin 1 tablet daily except for 1.5 tablets on Monday, Wednesday, Friday, and Saturday.  STOPPED premier protein drinks (1 time daily) ON 06/12/24, ADD MORE LEAFY VEGGIES IN DIET.  Recheck INR 4 weeks. Coumadin  Clinic [phone]

## 2024-07-13 ENCOUNTER — Ambulatory Visit: Admitting: *Deleted

## 2024-07-13 ENCOUNTER — Encounter: Payer: Self-pay | Admitting: *Deleted

## 2024-07-13 DIAGNOSIS — I4891 Unspecified atrial fibrillation: Secondary | ICD-10-CM | POA: Diagnosis not present

## 2024-07-13 DIAGNOSIS — I513 Intracardiac thrombosis, not elsewhere classified: Secondary | ICD-10-CM | POA: Diagnosis not present

## 2024-07-13 LAB — POCT INR: INR: 6 — AB (ref 2.0–3.0)

## 2024-07-13 NOTE — Progress Notes (Signed)
 Patient declined to go to the LAB for a STAT INR. Advised of risks-bleeding, bruising, and clot or stroke or clot if INR decreases.  Description   INR-6.0; Do not take any warfarin today, no warfarin tomorrow, no warfarin Friday then START taking Warfarin 1 tablet daily except for 1.5 tablets on Monday, Wednesday, Friday.  STOPPED premier protein drinks (1 time daily) ON 06/12/24, ADD MORE LEAFY VEGGIES IN DIET.  Recheck INR 1 week. Coumadin  Clinic [phone]

## 2024-07-13 NOTE — Patient Instructions (Signed)
 Description   INR-6.0; Do not take any warfarin today, no warfarin tomorrow, no warfarin Friday then START taking Warfarin 1 tablet daily except for 1.5 tablets on Monday, Wednesday, Friday.  STOPPED premier protein drinks (1 time daily) ON 06/12/24, ADD MORE LEAFY VEGGIES IN DIET.  Recheck INR 1 week. Coumadin  Clinic [phone]

## 2024-07-22 ENCOUNTER — Ambulatory Visit

## 2024-07-22 DIAGNOSIS — Z5181 Encounter for therapeutic drug level monitoring: Secondary | ICD-10-CM

## 2024-07-22 DIAGNOSIS — I4891 Unspecified atrial fibrillation: Secondary | ICD-10-CM

## 2024-07-22 DIAGNOSIS — I513 Intracardiac thrombosis, not elsewhere classified: Secondary | ICD-10-CM

## 2024-07-22 LAB — POCT INR: INR: 2.2 (ref 2.0–3.0)

## 2024-07-22 NOTE — Patient Instructions (Signed)
 Take 2 tablets tonight only then continue taking Warfarin 1 tablet daily except for 1.5 tablets on Monday, Wednesday, Friday.   ADD MORE LEAFY VEGGIES IN DIET.  Recheck INR 3 weeks. Coumadin  Clinic [phone]

## 2024-07-22 NOTE — Progress Notes (Signed)
 INR  2.2 Take 2 tablets tonight only then continue taking Warfarin 1 tablet daily except for 1.5 tablets on Monday, Wednesday, Friday.   ADD MORE LEAFY VEGGIES IN DIET.  Recheck INR 3 weeks. Coumadin  Clinic [phone]

## 2024-08-09 ENCOUNTER — Ambulatory Visit

## 2024-08-15 ENCOUNTER — Ambulatory Visit: Payer: Medicare HMO

## 2024-08-16 ENCOUNTER — Ambulatory Visit

## 2024-08-22 ENCOUNTER — Encounter: Admitting: Internal Medicine

## 2024-09-02 ENCOUNTER — Ambulatory Visit

## 2024-11-08 ENCOUNTER — Ambulatory Visit

## 2024-12-06 ENCOUNTER — Ambulatory Visit: Admitting: Internal Medicine

## 2025-02-07 ENCOUNTER — Ambulatory Visit

## 2025-05-09 ENCOUNTER — Ambulatory Visit

## 2025-08-08 ENCOUNTER — Ambulatory Visit

## 2025-11-07 ENCOUNTER — Ambulatory Visit
